# Patient Record
Sex: Male | Born: 1960 | Race: Black or African American | Hispanic: No | Smoking: Former smoker
Health system: Southern US, Community
[De-identification: ages and names within clinical notes are randomized; demographics above are authoritative.]

## PROBLEM LIST (undated history)

## (undated) ENCOUNTER — Emergency Department (HOSPITAL_COMMUNITY): Admission: EM | Payer: Self-pay

## (undated) DIAGNOSIS — J449 Chronic obstructive pulmonary disease, unspecified: Secondary | ICD-10-CM

## (undated) DIAGNOSIS — F99 Mental disorder, not otherwise specified: Secondary | ICD-10-CM

## (undated) DIAGNOSIS — I499 Cardiac arrhythmia, unspecified: Secondary | ICD-10-CM

## (undated) DIAGNOSIS — I509 Heart failure, unspecified: Secondary | ICD-10-CM

## (undated) DIAGNOSIS — K219 Gastro-esophageal reflux disease without esophagitis: Secondary | ICD-10-CM

## (undated) DIAGNOSIS — I10 Essential (primary) hypertension: Secondary | ICD-10-CM

## (undated) DIAGNOSIS — E119 Type 2 diabetes mellitus without complications: Secondary | ICD-10-CM

## (undated) DIAGNOSIS — I2510 Atherosclerotic heart disease of native coronary artery without angina pectoris: Secondary | ICD-10-CM

## (undated) DIAGNOSIS — M109 Gout, unspecified: Secondary | ICD-10-CM

## (undated) DIAGNOSIS — N189 Chronic kidney disease, unspecified: Secondary | ICD-10-CM

## (undated) DIAGNOSIS — F329 Major depressive disorder, single episode, unspecified: Secondary | ICD-10-CM

## (undated) DIAGNOSIS — J101 Influenza due to other identified influenza virus with other respiratory manifestations: Secondary | ICD-10-CM

## (undated) DIAGNOSIS — F32A Depression, unspecified: Secondary | ICD-10-CM

## (undated) HISTORY — PX: CARDIAC CATHETERIZATION: SHX172

## (undated) HISTORY — PX: HERNIA REPAIR: SHX51

## (undated) HISTORY — DX: Chronic kidney disease, unspecified: N18.9

## (undated) HISTORY — PX: SHOULDER SURGERY: SHX246

## (undated) HISTORY — DX: Chronic obstructive pulmonary disease, unspecified: J44.9

## (undated) HISTORY — DX: Heart failure, unspecified: I50.9

## (undated) HISTORY — PX: ANKLE SURGERY: SHX546

## (undated) HISTORY — DX: Cardiac arrhythmia, unspecified: I49.9

---

## 2003-01-27 ENCOUNTER — Emergency Department (HOSPITAL_COMMUNITY): Admission: EM | Admit: 2003-01-27 | Discharge: 2003-01-27 | Payer: Self-pay | Admitting: Emergency Medicine

## 2003-01-29 ENCOUNTER — Emergency Department (HOSPITAL_COMMUNITY): Admission: EM | Admit: 2003-01-29 | Discharge: 2003-01-30 | Payer: Self-pay | Admitting: Emergency Medicine

## 2003-01-29 ENCOUNTER — Encounter: Payer: Self-pay | Admitting: Emergency Medicine

## 2013-02-18 ENCOUNTER — Encounter (HOSPITAL_COMMUNITY): Payer: Self-pay | Admitting: Emergency Medicine

## 2013-02-18 ENCOUNTER — Emergency Department (HOSPITAL_COMMUNITY)
Admission: EM | Admit: 2013-02-18 | Discharge: 2013-02-18 | Disposition: A | Discharge status: Other Acute Inpatient Hospital | Payer: Self-pay | Attending: Emergency Medicine | Admitting: Emergency Medicine

## 2013-02-18 ENCOUNTER — Inpatient Hospital Stay (HOSPITAL_COMMUNITY)
Admission: AD | Admit: 2013-02-18 | Discharge: 2013-02-21 | DRG: 885 | Disposition: A | Discharge status: Home / Self Care | Payer: Self-pay | Source: Intra-hospital | Attending: Psychiatry | Admitting: Psychiatry

## 2013-02-18 ENCOUNTER — Encounter (HOSPITAL_COMMUNITY): Payer: Self-pay | Admitting: *Deleted

## 2013-02-18 DIAGNOSIS — Z91199 Patient's noncompliance with other medical treatment and regimen due to unspecified reason: Secondary | ICD-10-CM

## 2013-02-18 DIAGNOSIS — K0889 Other specified disorders of teeth and supporting structures: Secondary | ICD-10-CM

## 2013-02-18 DIAGNOSIS — G8929 Other chronic pain: Secondary | ICD-10-CM

## 2013-02-18 DIAGNOSIS — F1910 Other psychoactive substance abuse, uncomplicated: Secondary | ICD-10-CM

## 2013-02-18 DIAGNOSIS — M25579 Pain in unspecified ankle and joints of unspecified foot: Secondary | ICD-10-CM | POA: Insufficient documentation

## 2013-02-18 DIAGNOSIS — I10 Essential (primary) hypertension: Secondary | ICD-10-CM | POA: Diagnosis present

## 2013-02-18 DIAGNOSIS — F17200 Nicotine dependence, unspecified, uncomplicated: Secondary | ICD-10-CM | POA: Diagnosis present

## 2013-02-18 DIAGNOSIS — Z79899 Other long term (current) drug therapy: Secondary | ICD-10-CM | POA: Insufficient documentation

## 2013-02-18 DIAGNOSIS — M545 Low back pain, unspecified: Secondary | ICD-10-CM | POA: Diagnosis present

## 2013-02-18 DIAGNOSIS — F329 Major depressive disorder, single episode, unspecified: Secondary | ICD-10-CM

## 2013-02-18 DIAGNOSIS — K120 Recurrent oral aphthae: Secondary | ICD-10-CM | POA: Diagnosis present

## 2013-02-18 DIAGNOSIS — F339 Major depressive disorder, recurrent, unspecified: Principal | ICD-10-CM

## 2013-02-18 DIAGNOSIS — Z862 Personal history of diseases of the blood and blood-forming organs and certain disorders involving the immune mechanism: Secondary | ICD-10-CM | POA: Insufficient documentation

## 2013-02-18 DIAGNOSIS — R45851 Suicidal ideations: Secondary | ICD-10-CM | POA: Insufficient documentation

## 2013-02-18 DIAGNOSIS — K029 Dental caries, unspecified: Secondary | ICD-10-CM | POA: Diagnosis present

## 2013-02-18 DIAGNOSIS — F1410 Cocaine abuse, uncomplicated: Secondary | ICD-10-CM

## 2013-02-18 DIAGNOSIS — Z9119 Patient's noncompliance with other medical treatment and regimen: Secondary | ICD-10-CM

## 2013-02-18 DIAGNOSIS — F322 Major depressive disorder, single episode, severe without psychotic features: Secondary | ICD-10-CM

## 2013-02-18 DIAGNOSIS — M109 Gout, unspecified: Secondary | ICD-10-CM | POA: Diagnosis present

## 2013-02-18 DIAGNOSIS — Z8639 Personal history of other endocrine, nutritional and metabolic disease: Secondary | ICD-10-CM | POA: Insufficient documentation

## 2013-02-18 DIAGNOSIS — F1210 Cannabis abuse, uncomplicated: Secondary | ICD-10-CM

## 2013-02-18 HISTORY — DX: Mental disorder, not otherwise specified: F99

## 2013-02-18 HISTORY — DX: Depression, unspecified: F32.A

## 2013-02-18 HISTORY — DX: Gout, unspecified: M10.9

## 2013-02-18 HISTORY — DX: Essential (primary) hypertension: I10

## 2013-02-18 HISTORY — DX: Major depressive disorder, single episode, unspecified: F32.9

## 2013-02-18 LAB — COMPREHENSIVE METABOLIC PANEL
ALT: 13 U/L (ref 0–53)
AST: 16 U/L (ref 0–37)
Albumin: 3.5 g/dL (ref 3.5–5.2)
CO2: 25 mEq/L (ref 19–32)
Chloride: 106 mEq/L (ref 96–112)
GFR calc non Af Amer: 59 mL/min — ABNORMAL LOW (ref >90)
Sodium: 140 mEq/L (ref 135–145)
Total Bilirubin: 0.3 mg/dL (ref 0.3–1.2)

## 2013-02-18 LAB — CBC
Platelets: 265 10*3/uL (ref 150–400)
RBC: 4.48 MIL/uL (ref 4.22–5.81)
RDW: 14.4 % (ref 11.5–15.5)
WBC: 11.1 10*3/uL — ABNORMAL HIGH (ref 4.0–10.5)

## 2013-02-18 LAB — ACETAMINOPHEN LEVEL: Acetaminophen (Tylenol), Serum: <15 ug/mL (ref 10–30)

## 2013-02-18 LAB — RAPID URINE DRUG SCREEN, HOSP PERFORMED
Amphetamines: NOT DETECTED
Benzodiazepines: NOT DETECTED
Opiates: NOT DETECTED
Tetrahydrocannabinol: POSITIVE — AB

## 2013-02-18 MED ORDER — COLCHICINE 0.6 MG PO TABS
0.6000 mg | ORAL_TABLET | Freq: Two times a day (BID) | ORAL | Status: DC
  Administered 2013-02-18 – 2013-02-21 (×6): 0.6 mg via ORAL
  Filled 2013-02-18 (×7): qty 1
  Filled 2013-02-18 (×2): qty 14
  Filled 2013-02-18 (×5): qty 1

## 2013-02-18 MED ORDER — CLONIDINE HCL 0.2 MG PO TABS
0.2000 mg | ORAL_TABLET | Freq: Two times a day (BID) | ORAL | Status: DC
  Administered 2013-02-18 – 2013-02-19 (×2): 0.2 mg via ORAL
  Filled 2013-02-18 (×5): qty 1
  Filled 2013-02-18: qty 2
  Filled 2013-02-18: qty 1

## 2013-02-18 MED ORDER — OXYCODONE-ACETAMINOPHEN 5-325 MG PO TABS
2.0000 | ORAL_TABLET | Freq: Once | ORAL | Status: AC
  Administered 2013-02-18: 2 via ORAL
  Filled 2013-02-18: qty 2

## 2013-02-18 MED ORDER — LISINOPRIL 10 MG PO TABS
10.0000 mg | ORAL_TABLET | Freq: Every morning | ORAL | Status: DC
  Administered 2013-02-18: 10 mg via ORAL
  Filled 2013-02-18 (×2): qty 1

## 2013-02-18 MED ORDER — ACETAMINOPHEN 325 MG PO TABS: 650.0000 mg | ORAL_TABLET | ORAL | Status: DC | PRN

## 2013-02-18 MED ORDER — ALUM & MAG HYDROXIDE-SIMETH 200-200-20 MG/5ML PO SUSP: 30.0000 mL | ORAL | Status: DC | PRN

## 2013-02-18 MED ORDER — LISINOPRIL 10 MG PO TABS
10.0000 mg | ORAL_TABLET | Freq: Every morning | ORAL | Status: DC
  Administered 2013-02-19: 10 mg via ORAL
  Filled 2013-02-18 (×3): qty 1

## 2013-02-18 MED ORDER — COLCHICINE 0.6 MG PO TABS
0.6000 mg | ORAL_TABLET | Freq: Two times a day (BID) | ORAL | Status: DC
  Administered 2013-02-18: 0.6 mg via ORAL
  Filled 2013-02-18 (×2): qty 1

## 2013-02-18 MED ORDER — MAGNESIUM HYDROXIDE 400 MG/5ML PO SUSP: 30.0000 mL | Freq: Every day | ORAL | Status: DC | PRN

## 2013-02-18 MED ORDER — CLONIDINE HCL 0.1 MG PO TABS
0.2000 mg | ORAL_TABLET | Freq: Two times a day (BID) | ORAL | Status: DC
  Administered 2013-02-18 (×2): 0.2 mg via ORAL
  Filled 2013-02-18 (×2): qty 2

## 2013-02-18 MED ORDER — TRAZODONE HCL 50 MG PO TABS
50.0000 mg | ORAL_TABLET | Freq: Every evening | ORAL | Status: DC | PRN
  Administered 2013-02-18 – 2013-02-19 (×2): 50 mg via ORAL
  Filled 2013-02-18 (×3): qty 1

## 2013-02-18 NOTE — ED Notes (Signed)
Report called to [facility] at [facility]. Accepted to room 508-1 Dr.Jonnalagadda. Will be transported by hospital security and mental health tech. Belongings bag x1 given to security.
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
## 2013-02-18 NOTE — Progress Notes (Signed)
D) Pt on the unit and tearful. States, "I just give up..I really just give up". Said "Everyone is down on me and doesn't give me a chance. I can't get ahead. No place to live and alone". Crying when sharing his feelings. A) Pt given encouragement and praise for coming to the hospital. Encouraged to speak with his social worker tomorrow and share his concerns. Verbal contract for Pt's safety obtained. R) Pt is sad and depressed. Orienting to the unit without difficulty.

## 2013-02-18 NOTE — ED Notes (Signed)
Pt unable to urinate at this time.  

## 2013-02-18 NOTE — Tx Team (Signed)
Initial Interdisciplinary Treatment Plan  PATIENT STRENGTHS: (choose at least two) Ability for insight Average or above average intelligence Capable of independent living General fund of knowledge  PATIENT STRESSORS: Financial difficulties   PROBLEM LIST: Problem List/Patient Goals Date to be addressed Date deferred Reason deferred Estimated date of resolution  Depression 02/18/13     Suicidal Ideation 02/18/13                                                DISCHARGE CRITERIA:  Ability to meet basic life and health needs Improved stabilization in mood, thinking, and/or behavior Verbal commitment to aftercare and medication compliance  PRELIMINARY DISCHARGE PLAN: Attend aftercare/continuing care group Placement in alternative living arrangements  PATIENT/FAMIILY INVOLVEMENT: This treatment plan has been presented to and reviewed with the patient, Chris [name], and/or family member, .  The patient and family have been given the opportunity to ask questions and make suggestions.  [name], [place] 02/18/2013, 6:31 PM

## 2013-02-18 NOTE — ED Notes (Signed)
Pt states he was released from prison 4 years ago, tonight he called [employer] and states he was going to kill himself by jumping in front of a car. Pt taken to [facility], pt has not taken BP meds x 2 days. Sent here for BP control and med clearance.
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
## 2013-02-18 NOTE — ED Provider Notes (Signed)
CSN: [id number]     Arrival date & time 02/18/13  0320 History   First MD Initiated Contact with Patient 02/18/13 [phone]     Chief Complaint  Patient presents with  . Medical Clearance   (Consider location/radiation/quality/duration/timing/severity/associated sxs/prior Treatment) Patient is a 52 y.o. male presenting with mental health disorder. The history is provided by the patient.  Mental Health Problem Presenting symptoms: suicidal thoughts   Patient accompanied by:  Law enforcement Degree of incapacity (severity):  Moderate Onset quality:  Sudden Duration: today. Timing:  Constant Progression:  Unchanged Chronicity:  New Context: not alcohol use and not drug abuse   Context comment:  Life stressors Treatment compliance:  Untreated Relieved by:  Nothing Worsened by:  Nothing tried Ineffective treatments:  None tried Associated symptoms: no abdominal pain, no chest pain and no headaches     Past Medical History  Diagnosis Date  . Hypertension   . Gout    Past Surgical History  Procedure Laterality Date  . Shoulder surgery    . Ankle surgery     No family history on file. History  Substance Use Topics  . Smoking status: Current Every Day Smoker  . Smokeless tobacco: Not on file  . Alcohol Use: No    Review of Systems  Constitutional: Negative for fever.  HENT: Negative for rhinorrhea, drooling and neck pain.   Eyes: Negative for pain.  Respiratory: Negative for cough and shortness of breath.   Cardiovascular: Negative for chest pain and leg swelling.  Gastrointestinal: Negative for nausea, vomiting, abdominal pain and diarrhea.  Genitourinary: Negative for dysuria and hematuria.  Musculoskeletal: Negative for gait problem.       Ankle pain  Skin: Negative for color change.  Neurological: Negative for numbness and headaches.  Hematological: Negative for adenopathy.  Psychiatric/Behavioral: Positive for suicidal ideas. Negative for behavioral problems.  All  other systems reviewed and are negative.    Allergies  Review of patient's allergies indicates no known allergies.  Home Medications   Current Outpatient Rx  Name  Route  Sig  Dispense  Refill  . cloNIDine (CATAPRES) 0.2 MG tablet   Oral   Take 0.2 mg by mouth 2 (two) times daily.         [name] lisinopril (PRINIVIL,ZESTRIL) 10 MG tablet   Oral   Take 10 mg by mouth every morning.          BP 184/107  Pulse 66  Temp(Src) 98.7 F (37.1 C) (Oral)  Resp 20  Wt 230 lb (104.327 kg)  SpO2 98% Physical Exam  Nursing note and vitals reviewed. Constitutional: He is oriented to person, place, and time. He appears well-developed and well-nourished.  HENT:  Head: Normocephalic and atraumatic.  Right Ear: External ear normal.  Left Ear: External ear normal.  Nose: Nose normal.  Mouth/Throat: Oropharynx is clear and moist. No oropharyngeal exudate.  Eyes: Conjunctivae and EOM are normal. Pupils are equal, round, and reactive to light.  Neck: Normal range of motion. Neck supple.  Cardiovascular: Normal rate, regular rhythm, normal heart sounds and intact distal pulses.  Exam reveals no gallop and no friction rub.   No murmur heard. Pulmonary/Chest: Effort normal and breath sounds normal. No respiratory distress. He has no wheezes.  Abdominal: Soft. Bowel sounds are normal. He exhibits no distension. There is no tenderness. There is no rebound and no guarding.  Musculoskeletal: Normal range of motion. He exhibits no edema and no tenderness.  Mild ttp of left medial ankle. Mild  warmth noted. 2+ distal pulses.   Neurological: He is alert and oriented to person, place, and time.  Skin: Skin is warm and dry.  Psychiatric: He has a normal mood and affect. His behavior is normal.    ED Course  Procedures (including critical care time) Labs Review Labs Reviewed  CBC - Abnormal; Notable for the following:    WBC 11.1 (*)    All other components within normal limits  COMPREHENSIVE  METABOLIC PANEL - Abnormal; Notable for the following:    Potassium 3.4 (*)    Creatinine, Ser 1.37 (*)    GFR calc non Af Amer 59 (*)    GFR calc Af Amer 68 (*)    All other components within normal limits  SALICYLATE LEVEL - Abnormal; Notable for the following:    Salicylate Lvl <2.0 (*)    All other components within normal limits  ACETAMINOPHEN LEVEL  ETHANOL  URINE RAPID DRUG SCREEN (HOSP PERFORMED)   Imaging Review No results found.  MDM   1. Major depression, recurrent    4:33 AM 52 y.o. male who presents with suicidal ideations. The patient states that he is under stress due to his job and kids. He states that he bought heroin today and wanted to overdose with it. He denies taking any medicine or trying to hurt himself. He denies any drug or alcohol abuse. The patient is afebrile and vital signs are unremarkable here. He is mildly hypertensive but has not taken his blood pressure medicines in 2 days. He also complains of gout in his left ankle. Will give pain medicine now for his gout pain and restart his home blood pressure medicines. Will consult psych for evaluation.     Junius Argyle, MD 02/18/13 2000

## 2013-02-18 NOTE — ED Notes (Signed)
Currently is having a gout flair up and is pain. Refuses tylenol. Order for Colchicine started and did receive a dose this morning. Encouraged to drink a lot of water.

## 2013-02-18 NOTE — Progress Notes (Signed)
Writer spoke with patient at medication window and he was sad and tearful discussing his situation that has caused him to feel suicidal. Patient reports that he feels hopeless concerning finding a job d/t his prior history. Writer offered patient encouragement concerning his situation. Writer informed patient of medication available if needed to aid with sleep. Patient c/o having a tooth absess that he feels has burst and inquiring about an antibiotic being started. Writer informed patient that this would be noted and to make sure he speaks to his doctor on tomorrow concerning this matter. Patient denies hi/a/v hallucinations and has passive si which he verbally contracts for safety. Will continue to monitor with 15 min checks.

## 2013-02-18 NOTE — ED Notes (Signed)
Pt tearful, stating that he can't seem to catch a break and is unable to find a job due to his hx of serving time in prison. Pt states he can't go on like this.

## 2013-02-18 NOTE — Progress Notes (Signed)
52 year old male pt admitted on voluntary basis. Pt reports, on admission, that he has been feeling depressed and suicidal. Pt spoke about how he had bought some heroin and had someone fix the needle for him with the thought of overdosing on the heroin. Pt said he couldn't do it and threw the needle away and then had thoughts of walking into traffic. Pt said he could not do that instead decided to call the police who picked him up and brought him to the ED. Pt reports that he has been out of jail for 4 years and is trying to do right and get a job but can not find one. Pt also unsure of living situation when he leaves from here but says he does not want to go back to where he was. Pt states that he does not have any support systems. Pt is able to contract for safety on the unit.

## 2013-02-19 DIAGNOSIS — F1210 Cannabis abuse, uncomplicated: Secondary | ICD-10-CM

## 2013-02-19 DIAGNOSIS — F1410 Cocaine abuse, uncomplicated: Secondary | ICD-10-CM

## 2013-02-19 DIAGNOSIS — F339 Major depressive disorder, recurrent, unspecified: Principal | ICD-10-CM

## 2013-02-19 MED ORDER — FLUOXETINE HCL 10 MG PO CAPS
10.0000 mg | ORAL_CAPSULE | Freq: Every day | ORAL | Status: DC
  Administered 2013-02-19 – 2013-02-21 (×3): 10 mg via ORAL
  Filled 2013-02-19 (×6): qty 1

## 2013-02-19 MED ORDER — CLONIDINE HCL 0.1 MG PO TABS
0.3000 mg | ORAL_TABLET | Freq: Two times a day (BID) | ORAL | Status: DC
  Administered 2013-02-19 – 2013-02-21 (×4): 0.3 mg via ORAL
  Filled 2013-02-19: qty 60
  Filled 2013-02-19 (×2): qty 1
  Filled 2013-02-19: qty 60
  Filled 2013-02-19: qty 1
  Filled 2013-02-19: qty 3
  Filled 2013-02-19 (×5): qty 1

## 2013-02-19 MED ORDER — LISINOPRIL 20 MG PO TABS
20.0000 mg | ORAL_TABLET | Freq: Every morning | ORAL | Status: DC
  Administered 2013-02-20 – 2013-02-21 (×2): 20 mg via ORAL
  Filled 2013-02-19 (×4): qty 1

## 2013-02-19 NOTE — [facility] Group Notes (Signed)
[facility] LCSW Group Therapy                 Overcoming Obstacles  1:15 -2:30        02/19/2013   3:49 PM   Type of Therapy:  Group Therapy  Participation Level: Did not attend goup.   Wynn Banker 02/19/2013   3:49 PM

## 2013-02-19 NOTE — Progress Notes (Signed)
D: Patient in the hallway on approach.  Patient states he had a bad day because he was not able to get percocet for a tooth abscess.  Writer offered patient tylenol but patient refused and stated he was not having pain.  Patient can be flirtatious but he is easily redirected.  Patient states he has passive SI but verbally contracts for safety.     A: Staff to monitor Q 15 mins for safety.  Encouragement and support offered.  No scheduled medications administered per orders.  Trazodone administered prn for sleep. R: Patient remains safe on the unit.  Patient did not attend group tonight.  Patient visible on the unit and interacting with peers.  Patient taking administered medications.

## 2013-02-19 NOTE — [facility] Group Notes (Signed)
[facility] LCSW Aftercare Discharge Planning Group Note   02/19/2013 10:16 AM  Participation Quality:  Did not attend group.  Jordanne Elsbury, [name]

## 2013-02-19 NOTE — Progress Notes (Signed)
The focus of this group is to help patients review their daily goal of treatment and discuss progress on daily workbooks. Pt attended the evening group ten minutes late and left the group twice before returning to speak with the Writer. Pt initially interrupted group to ask if the Writer could heat up his coffee and was told this could happen following group but not during. Pt responded minimally to the Writer's prompts and seemed disinterested in the group.

## 2013-02-19 NOTE — Progress Notes (Signed)
Adult Psychoeducational Group Note  Date:  02/18/13 Time: 8:00 pm  Group Topic/Focus:  Wrap-Up Group:   The focus of this group is to help patients review their daily goal of treatment and discuss progress on daily workbooks.  Participation Level:  Active  Participation Quality:  Appropriate  Affect:  Appropriate  Cognitive:  Appropriate  Insight: Good  Engagement in Group:  Engaged  Modes of Intervention:  Discussion, Education, Socialization and Support  Additional Comments:  Pt stated that he is in the hospital for depression. Pt stated that he tried to walk out in front of a truck. Pt has been to prison and stated that he has struggled to find work due to his criminal record. Pt stated that he is frustrated and wants a second chance in life. 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   [place], Chris [name] 02/19/2013, 1:25 AM
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
## 2013-02-19 NOTE — Progress Notes (Signed)
Adult Psychoeducational Group Note  Date:  02/19/2013 Time: 11:00am Group Topic/Focus:  Wellness Toolbox:   The focus of this group is to discuss various aspects of wellness, balancing those aspects and exploring ways to increase the ability to experience wellness.  Patients will create a wellness toolbox for use upon discharge.  Participation Level:  Active  Participation Quality:  Appropriate and Attentive  Affect:  Appropriate  Cognitive:  Alert and Appropriate  Insight: Appropriate and Good  Engagement in Group:  Engaged  Modes of Intervention:  Discussion and Education  Additional Comments:  Pt attended and participated in group. When asked what was his biggest struggle he stated anger. He said he would rater hurt himself than someone else when he gets angry.  Shelly Bombard D 02/19/2013, 2:13 PM

## 2013-02-19 NOTE — Tx Team (Addendum)
Interdisciplinary Treatment Plan Update   Date Reviewed:  02/19/2013  Time Reviewed:  10:13 AM  Progress in Treatment:   Attending groups: Yes Participating in groups: Yes Taking medication as prescribed: Yes  Tolerating medication: Yes Family/Significant other contact made: No, but consent for collateral contact with sister. Patient understands diagnosis: Yes  Discussing patient identified problems/goals with staff: Yes Medical problems stabilized or resolved: Yes Denies suicidal/homicidal ideation: Yes Patient has not harmed self or others: Yes  For review of initial/current patient goals, please see plan of care.  Estimated Length of Stay:   Reasons for Continued Hospitalization:  Anxiety Depression Medication stabilization  New Problems/Goals identified:    Discharge Plan or Barriers:   Home with outpatient follow up with Family Services  Additional Comments:  N/A   Attendees:  Patient:  02/19/2013 10:13 AM   Signature: Mervyn Gay, MD 02/19/2013 10:13 AM  Signature:  Verne Spurr, PA 02/19/2013 10:13 AM  Signature:  02/19/2013 10:13 AM  Signature:Beverly Terrilee Croak, RN 02/19/2013 10:13 AM  Signature:  Neill Loft RN 02/19/2013 10:13 AM  Signature:  Juline Patch, LCSW 02/19/2013 10:13 AM  Signature:  Reyes Ivan, LCSW 02/19/2013 10:13 AM  Signature:  Maseta Dorley,Care Coordinator 02/19/2013 10:13 AM  Signature:   02/19/2013 10:13 AM  Signature: Leighton Parody, RN 02/19/2013  10:13 AM   Signature:    Signature:      Scribe for Treatment Team:   Juline Patch,  02/19/2013 10:13 AM

## 2013-02-19 NOTE — Progress Notes (Addendum)
D:  Patient's self inventory sheet, patient has poor sleep, good appetite, normal energy level, good attention span.  Rated depression and hopelessness #6, anxiety #8.  Denied withdrawals.  Has experienced headaches in past 24 hours.  Contracts for safety.  Denied plan.   Worst pain #7, feels better.  No questions for staff.  Has lower back pain #10, lower right tooth pain "abcess busted #45."  Would like percocet for left ankle swollen.  Was given percocet in ER yesterday which did not touch pain.  Patient refused tylenol for pain.  No discharge plans.  No problems taking meds after discharge. A:  Medications administered per MD orders.  Emotional support and encouragement given patient. R:  Denied HI.  Denied A/V hallucinations.  SI , contracts for safety.  Will continue to monitor for sfaety with 15 minute checks.  Safety maintained.  1400  Patient has declined tylenol several times during the morning, stated he has tooth abcess pain.  Patient discussed with MD.  Patient came out of MD office, stating he desires strong pain medication.  Charge nurse and [facility] informed.
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
## 2013-02-19 NOTE — Progress Notes (Signed)
Recreation Therapy Notes  Date: 09.08.2014 Time: 3:00pm Location: [address] Day[address]  Group Topic: Wellness  Goal Area(s) Addresses:  Patient will define components of whole wellness. Patient will verbalize benefit of whole wellness.  Behavioral Response: Did not attend.   Marykay Lex Sybrina Laning, LRT/CTRS  Jearl Klinefelter 02/19/2013 10:43 PM

## 2013-02-20 DIAGNOSIS — F1210 Cannabis abuse, uncomplicated: Secondary | ICD-10-CM | POA: Diagnosis present

## 2013-02-20 DIAGNOSIS — F1410 Cocaine abuse, uncomplicated: Secondary | ICD-10-CM | POA: Diagnosis present

## 2013-02-20 DIAGNOSIS — F339 Major depressive disorder, recurrent, unspecified: Secondary | ICD-10-CM | POA: Diagnosis present

## 2013-02-20 DIAGNOSIS — G8929 Other chronic pain: Secondary | ICD-10-CM | POA: Diagnosis present

## 2013-02-20 MED ORDER — MELOXICAM 15 MG PO TABS
15.0000 mg | ORAL_TABLET | Freq: Every day | ORAL | Status: DC
  Administered 2013-02-20 – 2013-02-21 (×2): 15 mg via ORAL
  Filled 2013-02-20 (×3): qty 1
  Filled 2013-02-20 (×2): qty 2
  Filled 2013-02-20: qty 1
  Filled 2013-02-20: qty 28

## 2013-02-20 MED ORDER — BENZOCAINE 10 % MT GEL
Freq: Four times a day (QID) | OROMUCOSAL | Status: DC | PRN
  Administered 2013-02-20: 09:00:00 via OROMUCOSAL

## 2013-02-20 MED ORDER — LIDOCAINE 5 % EX PTCH
1.0000 | MEDICATED_PATCH | CUTANEOUS | Status: DC
  Administered 2013-02-20: 1 via TRANSDERMAL
  Filled 2013-02-20 (×2): qty 1
  Filled 2013-02-20: qty 7
  Filled 2013-02-20 (×2): qty 1

## 2013-02-20 NOTE — Progress Notes (Addendum)
Recreation Therapy Notes  Date: 09.09.2014 Time: 2:45pm Location: [address] Day[address]   Group Topic: Animal Assisted Activities  Behavioral Response: Did not attend  [employer], LRT/CTRS  Naarah Borgerding L 02/20/2013 5:07 PM
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
## 2013-02-20 NOTE — Progress Notes (Signed)
[facility] MD Progress Note  02/20/2013 1:36 PM Chris [name]  MRN:  [id number]  Subjective: Chris [name] states that he is angry because he didn't get anything for his dental pain. He also goes on to complain of pain in his lower back and the lump over his left iliac crest. He states his depression is better today and notes that it is a 4/10 and that his anxiety is 1/10. Chris [name] states that his sister came by and she has a friend who will give him a job. It seems that they have opened a door in their relationship and are able to communicate better. He is asking when he will be able to leave.  He reports no side effects from the prozac.   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  Hypertension    .  Gout    .  Mental disorder    .  Depression    Dental Carries  Aphthous Ulcer  Medical Non-compliance  AXIS IV: occupational problems, problems related to legal system/crime, problems related to social environment and problems with access to health care services  AXIS V: 51-60 moderate symptoms  ADL's:  Intact  Sleep: Good  Appetite:  Fair  Suicidal Ideation:  Denies today Homicidal Ideation:  denies AEB (as evidenced by):  Psychiatric Specialty Exam: Review of Systems  Constitutional: Negative.  Negative for fever, chills, weight loss, malaise/fatigue and diaphoresis.  HENT: Negative for congestion and sore throat.        Patient continues to complain of dental pain and the feeling that something has burst in his cheek. States the Oragel helped some.  Eyes: Negative for blurred vision, double vision and photophobia.  Respiratory:  Negative for cough, shortness of breath and wheezing.   Cardiovascular: Negative for chest pain, palpitations and PND.  Gastrointestinal: Negative for heartburn, nausea, vomiting, abdominal pain, diarrhea and constipation.  Musculoskeletal: Positive for back pain (Patient reports lower lumbar pain, and also a lump over the left iliac crest.). Negative for myalgias, joint pain and falls.  Neurological: Negative for dizziness, tingling, tremors, sensory change, speech change, focal weakness, seizures, loss of consciousness, weakness and headaches.  Endo/Heme/Allergies: Negative for polydipsia. Does not bruise/bleed easily.  Psychiatric/Behavioral: Negative for depression, suicidal ideas, hallucinations, memory loss and substance abuse. The patient is not nervous/anxious and does not have insomnia.     Blood pressure 161/101, pulse 69, temperature 97.2 F (36.2 C), temperature source Oral, resp. rate 22, height 5\' 11"  (1.803 m), weight 101.152 kg (223 lb).Body mass index is 31.12 kg/(m^2).  General Appearance: Casual  Eye Contact::  Fair  Speech:  Clear and Coherent  Volume:  Normal  Mood:  Anxious, Dysphoric and Irritable  Affect:  Congruent  Thought Process:  Goal Directed  Orientation:  Full (Time, Place, and Person)  Thought Content:  WDL  Suicidal Thoughts:  No  Homicidal Thoughts:  No  Memory:  Immediate;   Fair  Judgement:  Impaired  Insight:  Lacking  Psychomotor Activity:  Normal  Concentration:  Fair  Recall:  Fair  Akathisia:  No  Handed:  Right  AIMS (if indicated):     Assets:  Communication Skills Desire  for Improvement Physical Health  Sleep:  Number of Hours: 6.75   Current Medications: Current Facility-Administered Medications  Medication Dose Route Frequency Provider Last Rate Last Dose  . acetaminophen (TYLENOL) tablet 650 mg  650 mg Oral Q4H PRN Nanine Means, NP      . alum & mag hydroxide-simeth (MAALOX/MYLANTA) 200-200-20 MG/5ML suspension 30 mL  30 mL Oral  Q4H PRN Nanine Means, NP      . benzocaine (ORAJEL) 10 % mucosal gel   Mouth/Throat QID PRN Verne Spurr, PA-C      . cloNIDine (CATAPRES) tablet 0.3 mg  0.3 mg Oral BID Verne Spurr, PA-C   0.3 mg at 02/20/13 0846  . colchicine tablet 0.6 mg  0.6 mg Oral BID Nanine Means, NP   0.6 mg at 02/20/13 0847  . FLUoxetine (PROZAC) capsule 10 mg  10 mg Oral Daily Verne Spurr, PA-C   10 mg at 02/20/13 0847  . lisinopril (PRINIVIL,ZESTRIL) tablet 20 mg  20 mg Oral q morning - 10a Verne Spurr, PA-C   20 mg at 02/20/13 1157  . magnesium hydroxide (MILK OF MAGNESIA) suspension 30 mL  30 mL Oral Daily PRN Nanine Means, NP      . traZODone (DESYREL) tablet 50 mg  50 mg Oral QHS PRN Court Joy, PA-C   50 mg at 02/19/13 2156    Lab Results: No results found for this or any previous visit (from the past 48 hour(s)).  Physical Findings: AIMS: Facial and Oral Movements Muscles of Facial Expression: None, normal Lips and Perioral Area: None, normal Jaw: None, normal Tongue: None, normal,Extremity Movements Upper (arms, wrists, hands, fingers): None, normal Lower (legs, knees, ankles, toes): None, normal, Trunk Movements Neck, shoulders, hips: None, normal, Overall Severity Severity of abnormal movements (highest score from questions above): None, normal Incapacitation due to abnormal movements: None, normal Patient's awareness of abnormal movements (rate only patient's report): No Awareness, Dental Status Current problems with teeth and/or dentures?: No Does patient usually wear dentures?: No  CIWA:  CIWA-Ar Total: 4 COWS:  COWS Total Score: 2  Treatment Plan Summary: Daily contact with patient to assess and evaluate symptoms and progress in treatment Medication management  Plan: 1. Continue crisis management and stabilization. 2. Medication management to reduce current symptoms to base line and improve patient's overall level of functioning 3. Treat health problems as indicated. 4.  Develop treatment plan to decrease risk of relapse upon discharge and the need for readmission. 5. Psycho-social education regarding relapse prevention and self care. 6. Health care follow up as needed for medical problems. 7. Continue home medications where appropriate. 8. Dental consult is made for his oral pain. 9. Will order Lidoderm patch for his low back pain and add NSAID as well. 10. Will continue current medication regimen as written with the above changes as noted. 11. ELOS: 2 Days.   Medical Decision Making Problem Points:  Established problem, stable/improving (1) and New problem, with no additional work-up planned (3) Data Points:  Discuss tests with performing physician (1) Review or order clinical lab tests (1) Review or order medicine tests (1)  I certify that inpatient services furnished can reasonably be expected to improve the patient's condition.  Rona Ravens. [place] RPAC 4:02 PM 02/20/2013  Reviewed the information documented and agree with the treatment plan.  Samira Acero,JANARDHAHA R. 02/20/2013 7:14 PM

## 2013-02-20 NOTE — BHH Group Notes (Signed)
B[facility] LCSW Group Therapy  Feelings Around Diagnosis  02/20/2013 2:49 PM  Type of Therapy:  Group Therapy  Participation Level:  Minimal  Participation Quality:  Drowsy  Affect:  Depressed and Irritable  Cognitive:  Appropriate  Insight:  Improving  Engagement in Therapy:  Improving  Modes of Intervention:  Discussion, Education, Exploration, Problem-Solving, Rapport Building, Support   Summary of Progress/Problems: Patient shared his diagnosis is depression but he goes between depression and anger.  He shared he is having difficulty keeping his anger under control but is working on it.  Wynn Banker 02/20/2013, 2:49 PM

## 2013-02-20 NOTE — Progress Notes (Signed)
Adult Psychoeducational Group Note  Date:  02/20/2013 Time:  11:00am Group Topic/Focus:  Recovery Goals:   The focus of this group is to identify appropriate goals for recovery and establish a plan to achieve them.  Participation Level:  Did Not Attend  Participation Quality:    Affect:   Cognitive:    Insight:   Engagement in Group:    Modes of Intervention:    Additional Comments:  Pt did not attend group.  Shelly Bombard D 02/20/2013, 1:49 PM

## 2013-02-20 NOTE — Progress Notes (Addendum)
D:  Patient refused to discuss patient self inventory form this morning.  Has been irritable because he wants pain medication stronger than tylenol.  Had stated that percocet did not touch his pain that he was given in hospital before coming to [facility].   However, patient was seen in hallway, talking to peers, staff.  Respirations even and unlabored.  No signs of pain/distress noted on patient's face or body movements. A:  Medications administered per MD orders.  Emotional support and encouragement given patient. R:  Will continue to monitor patient for safety with 15 minute checks.  Safety maintained.  Nurse requested patient to take lisinopril during the morning.  Patient refused and went to bed.  Later patient got up and took lisinopril.  Stated he wants to be left alone at times.  Do not bother him.  Refused orajel when offered by nurse the second time.   1600  Patient refuses tylenol and orajel for his oral pain/discomfort.  Nurse has offered these prn meds several times throughout the day which patient hs declined.

## 2013-02-20 NOTE — B[facility] Suicide Risk Assessment (Signed)
[facility] INPATIENT:  Family/Significant Other Suicide Prevention Education  Suicide Prevention Education:  Contact Attempts; Marisue Brooklyn, Sister, [phone] has been identified by the patient as the family member/significant other with whom the patient will be residing, and identified as the person(s) who will aid the patient in the event of a mental health crisis.  With written consent from the patient, two attempts were made to provide suicide prevention education, prior to and/or following the patient's discharge.  We were unsuccessful in providing suicide prevention education.  A suicide education pamphlet was given to the patient to share with family/significant other.  Date and time of first attempt:  Message left on voicemail Date and time of second attempt: Wynn Banker 02/20/2013, 8:43 AM

## 2013-02-20 NOTE — Progress Notes (Signed)
Patient did not attend 0900 nurse education orientation group this morning. 

## 2013-02-21 MED ORDER — TRAZODONE HCL 50 MG PO TABS
50.0000 mg | ORAL_TABLET | Freq: Every evening | ORAL | Status: DC | PRN
  Filled 2013-02-21: qty 1

## 2013-02-21 MED ORDER — CLONIDINE HCL 0.3 MG PO TABS: 0.3000 mg | ORAL_TABLET | Freq: Two times a day (BID) | ORAL | Status: DC

## 2013-02-21 MED ORDER — LISINOPRIL 30 MG PO TABS: 30.0000 mg | ORAL_TABLET | Freq: Every morning | ORAL | Status: DC

## 2013-02-21 MED ORDER — LIDOCAINE 5 % EX PTCH: 1.0000 | MEDICATED_PATCH | CUTANEOUS | Status: DC

## 2013-02-21 MED ORDER — FLUOXETINE HCL 20 MG PO CAPS
20.0000 mg | ORAL_CAPSULE | Freq: Every day | ORAL | Status: DC
  Filled 2013-02-21: qty 14

## 2013-02-21 MED ORDER — MELOXICAM 15 MG PO TABS: 15.0000 mg | ORAL_TABLET | Freq: Every day | ORAL | Status: DC

## 2013-02-21 MED ORDER — COLCHICINE 0.6 MG PO TABS: 0.6000 mg | ORAL_TABLET | Freq: Two times a day (BID) | ORAL | Status: DC

## 2013-02-21 MED ORDER — LISINOPRIL 10 MG PO TABS
30.0000 mg | ORAL_TABLET | Freq: Every day | ORAL | Status: DC
  Filled 2013-02-21: qty 30

## 2013-02-21 MED ORDER — FLUOXETINE HCL 20 MG PO CAPS: 20.0000 mg | ORAL_CAPSULE | Freq: Every day | ORAL | Status: DC

## 2013-02-21 MED ORDER — TRAZODONE HCL 50 MG PO TABS: ORAL_TABLET | ORAL | Status: DC

## 2013-02-21 NOTE — Progress Notes (Signed)
Adult Psychoeducational Group Note  Date:  02/21/2013 Time:  10:00am Group Topic/Focus:  Therapeutic Activity  Participation Level:  Did Not Attend  Participation Quality:    Affect:    Cognitive:    Insight:   Engagement in Group:    Modes of Intervention:    Additional Comments:  Pt did not attend group  Shelly Bombard D 02/21/2013, 1:56 PM

## 2013-02-21 NOTE — BHH Suicide Risk Assessment (Signed)
[facility] INPATIENT:  Family/Significant Other Suicide Prevention Education  Suicide Prevention Education:  Education Completed; [name], Sister, [phone]; has been identified by the patient as the family member/significant other with whom the patient will be residing, and identified as the person(s) who will aid the patient in the event of a mental health crisis (suicidal ideations/suicide attempt).  With written consent from the patient, the family member/significant other has been provided the following suicide prevention education, prior to the and/or following the discharge of the patient.  The suicide prevention education provided includes the following:  Suicide risk factors  Suicide prevention and interventions  National Suicide Hotline telephone number  [facility] assessment telephone number  [facility] Emergency Assistance 911  [facility] and/or Residential Mobile Crisis Unit telephone number  Request made of family/significant other to:  Remove weapons (e.g., guns, rifles, knives), all items previously/currently identified as safety concern. Sister stated she does not know whether or not patient has access to guns.   Remove drugs/medications (over-the-counter, prescriptions, illicit drugs), all items previously/currently identified as a safety concern.  The family member/significant other verbalizes understanding of the suicide prevention education information provided.  The family member/significant other agrees to remove the items of safety concern listed above.  Wynn Banker 02/21/2013, 12:38 PM

## 2013-02-21 NOTE — B[facility] Group Notes (Signed)
[facility] LCSW Aftercare Discharge Planning Group Note   02/21/2013 9:52 AM  Participation Quality:  Did not attend group.  Chris [name], [name]

## 2013-02-21 NOTE — Progress Notes (Signed)
[facility] Adult Case Management Discharge Plan :  Will you be returning to the same living situation after discharge:  No.  Patient is considering staying with family at discharge. At discharge, do you have transportation home?:No.  Patient will be assisted with bus pass. Do you have the ability to pay for your medications:No. Patient assisted with indigent medicaitons  Release of information consent forms completed and in the chart;  Patient's signature needed at discharge.  Patient to Follow up at: Follow-up Information   Follow up with Dr. Dartha Lodge - [facility] On 02/22/2013. (Thursday, February 22, 2013 at 11:30 AM)    Contact information:   [address] [place], [place]   [address]  [phone]      Follow up with Sharen Hint - Service On 02/22/2013. (You are scheduled with Sharen Hint, LCSW on Thursday, February 22, 2013 at 12:30)       Patient denies SI/HI:   Patient no longer endorsing SI/HI or other thoughts of self harm.   Safety Planning and Suicide Prevention discussed:  .Reviewed with all patients during discharge planning group  Jansen Goodpasture, [name] 02/21/2013, 12:49 PM

## 2013-02-21 NOTE — Progress Notes (Signed)
Pt woke up very irritable, pt stated "I want to move". Pt very labile and started to escalate verbally.
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
## 2013-02-21 NOTE — Progress Notes (Signed)
Discharge Note: Discharge instructions & prescriptions given to patient. Patient verbalized understanding of discharge instructions and prescriptions. Returned belongings to patient. Denies SI/HI/AVH. Patient d/c without incident. 

## 2013-02-21 NOTE — Progress Notes (Signed)
Adult Psychoeducational Group Note  Date:  02/20/13 Time:  8:00 pm  Group Topic/Focus:  Wrap-Up Group:   The focus of this group is to help patients review their daily goal of treatment and discuss progress on daily workbooks.  Participation Level:  Active  Participation Quality:  Appropriate and Sharing  Affect:  Appropriate  Cognitive:  Appropriate  Insight: Good  Engagement in Group:  Engaged  Modes of Intervention:  Discussion, Education, Socialization and Support  Additional Comments:  Pt stated that he is suffering from depression. Pt was asked by the [facility] what he is thankful for in his life and he stated that he was thankful for being at the [facility] and thankful for the friends that he has met while in the hospital.   Laural Benes, Detta Mellin 02/21/2013, 1:23 AM

## 2013-02-21 NOTE — [facility] Suicide Risk Assessment (Signed)
Suicide Risk Assessment  Discharge Assessment     Demographic Factors:  Male, Adolescent or young adult, Low socioeconomic status and Unemployed  Mental Status Per Nursing Assessment::   On Admission:  Self-harm thoughts  Current Mental Status by Physician: Mental Status Examination: Patient appeared as per his stated age, casually dressed, and fairly groomed, and maintaining good eye contact. Patient has good mood and his affect was constricted. He has normal rate, rhythm, and volume of speech. His thought process is linear and goal directed. Patient has denied suicidal, homicidal ideations, intentions or plans. Patient has no evidence of auditory or visual hallucinations, delusions, and paranoia. Patient has fair insight judgment and impulse control.  Loss Factors: Financial problems/change in socioeconomic status  Historical Factors: Prior suicide attempts and Impulsivity  Risk Reduction Factors:   Sense of responsibility to family, Religious beliefs about death, Positive therapeutic relationship and Positive coping skills or problem solving skills  Continued Clinical Symptoms:  Depression:   Aggression Impulsivity Recent sense of peace/wellbeing Alcohol/Substance Abuse/Dependencies Previous Psychiatric Diagnoses and Treatments Medical Diagnoses and Treatments/Surgeries  Cognitive Features That Contribute To Risk:  Polarized thinking    Suicide Risk:  Mild:  Suicidal ideation of limited frequency, intensity, duration, and specificity.  There are no identifiable plans, no associated intent, mild dysphoria and related symptoms, good self-control (both objective and subjective assessment), few other risk factors, and identifiable protective factors, including available and accessible social support.  Discharge Diagnoses:   AXIS I:  Major Depression, Recurrent severe, Substance Induced Mood Disorder and Cocaine abuse AXIS II:  Deferred AXIS III:   Past Medical History   Diagnosis Date  . Hypertension   . Gout   . Mental disorder   . Depression    AXIS IV:  economic problems, occupational problems, other psychosocial or environmental problems, problems related to social environment and problems with primary support group AXIS V:  51-60 moderate symptoms  Plan Of Care/Follow-up recommendations:  Activity:  As tolerated Diet:  Regular  Is patient on multiple antipsychotic therapies at discharge:  No   Has Patient had three or more failed trials of antipsychotic monotherapy by history:  No  Recommended Plan for Multiple Antipsychotic Therapies: NA  Wetona Viramontes,JANARDHAHA R. 02/21/2013, 1:08 PM

## 2013-02-21 NOTE — Progress Notes (Signed)
Pt stated he would like to switch rooms with Durelle Bynum in 508, because "I want to punch that Moth$% Fu%&*# in the mouth, I can't even go in the dayroom, if I see him It makes me mad".

## 2013-02-21 NOTE — Tx Team (Signed)
Interdisciplinary Treatment Plan Update   Date Reviewed:  02/21/2013  Time Reviewed:  11:10 AM  Progress in Treatment:   Attending groups: Yes Participating in groups: Yes Taking medication as prescribed: Yes  Tolerating medication: Yes Family/Significant other contact made: Yes, collateral contact made with sister. Patient understands diagnosis: Yes  Discussing patient identified problems/goals with staff: Yes Medical problems stabilized or resolved: Yes Denies suicidal/homicidal ideation: Yes Patient has not harmed self or others: Yes  For review of initial/current patient goals, please see plan of care.  Estimated Length of Stay: Discharge today  Reasons for Continued Hospitalization:   New Problems/Goals identified:    Discharge Plan or Barriers:   Home with outpatient follow up with [facility]  Additional Comments:  N/A   Attendees:  Patient: Chris [name] 02/21/2013 11:10 AM   Signature: Mervyn Gay, MD 02/21/2013 11:10 AM  Signature:  Verne Spurr, PA 02/21/2013 11:10 AM  Signature:  02/21/2013 11:10 AM  Signature: Harold Barban, RN 02/21/2013 11:10 AM  Signature:      02/21/2013 11:10 AM  Signature:  Juline Patch, LCSW 02/21/2013 11:10 AM  Signature:  Reyes Ivan, LCSW 02/21/2013 11:10 AM  Signature:  Maseta Dorley,Care Coordinator 02/21/2013 11:10 AM  Signature:   02/21/2013 11:10 AM  Signature: Leighton Parody, RN 02/21/2013  11:10 AM  Signature:    Signature:      Scribe for Treatment Team:   [employer],  02/21/2013 11:10 AM

## 2013-02-21 NOTE — Progress Notes (Signed)
Patient ID: Chris [name], male   DOB: 05-18-1961, 52 y.o.   MRN: [id number] Pt did not attend 11am Psycho educational group.

## 2013-02-21 NOTE — Discharge Summary (Signed)
Physician Discharge Summary Note  Patient:  Chris [name] is an 52 y.o., male MRN:  [id number] DOB:  1960/06/19 Patient phone:  [phone] (home)  Patient address:   [address] [place] [address],   Date of Admission:  02/18/2013 Date of Discharge: 02/21/2013  Reason for Admission:  Depression with suicidal ideation  Discharge Diagnoses: Principal Problem:   Major depression, recurrent Active Problems:   Cocaine abuse   Cannabis abuse   Pain, dental   Back pain, chronic  Review of Systems  Constitutional: Negative.  Negative for fever, chills, weight loss, malaise/fatigue and diaphoresis.  HENT: Negative for congestion and sore throat.   Eyes: Negative for blurred vision, double vision and photophobia.  Respiratory: Negative for cough, shortness of breath and wheezing.   Cardiovascular: Negative for chest pain, palpitations and PND.  Gastrointestinal: Negative for heartburn, nausea, vomiting, abdominal pain, diarrhea and constipation.  Musculoskeletal: Negative for myalgias, joint pain and falls.  Neurological: Negative for dizziness, tingling, tremors, sensory change, speech change, focal weakness, seizures, loss of consciousness, weakness and headaches.  Endo/Heme/Allergies: Negative for polydipsia. Does not bruise/bleed easily.  Psychiatric/Behavioral: Negative for depression, suicidal ideas, hallucinations, memory loss and substance abuse. The patient is not nervous/anxious and does not have insomnia.     DSM5: DSM5:  Schizophrenia Disorders:  Obsessive-Compulsive Disorders:  Trauma-Stressor Disorders:  Substance/Addictive Disorders: Cannabis Use Disorder - Mild (305.20) Cocaine abuse  Depressive Disorders: Major Depressive Disorder - Moderate (296.22)  AXIS I: MDD recurrent w/o psychotic features, Cocaine abuse, Cannabis abuse  AXIS II: Deferred  AXIS III:  Past Medical History   Diagnosis  Date   .  Hypertension    .  Gout    .  Mental disorder    .   Depression    Dental Carries  Aphthous Ulcer  Medical Non-compliance  AXIS IV: occupational problems, problems related to legal system/crime, problems related to social environment and problems with access to health care services  AXIS V: 51-60 moderate symptoms  Level of Care:  OP  Hospital Course: [name] is a 52 yr old AA male with a long criminal history who presented to the [facility] via police from [place]. He called police to tell them that he was going to kill himself. He was picked up and taken to [facility] for evaluation. His blood pressure was felt to be unstable and he was then transported to the [facility] where he was accepted for acute psychiatric hospitalization due to his suicidal ideation with plans to walk into traffic.      Upon arrival at the unit he was evaluated and his symptoms were identified. He was initially resistant to cooperation and focused on getting pain medication for his back, gout, and dental pain.  Medication management was initiated with prozac, and ibuprofen.      He was oriented to the unit and encouraged to participate in unit programming.[name] noted that he was depressed for years since he got out of prison and has not been able to get a job.      Each day he was assessed by a provider to establish his response to treatment. Each day he requested pain medication in narcotic form that far exceeded his observed level of discomfort. He noted that his sister visited and he asked for discharge shortly after that stating that she had found him a job and that he was ready to leave.      On the day of discharge he denied SI/HI and AVH. He  was alert and oriented and in full contact with reality. His blood pressure was stable. He was given samples and a prescription for 1 month supply of his psychiatric medications.  On discharge he was in improved condition than upon arrival and agreed to follow up as noted below.  Consults:  Dental but was declined for  treatment.  Significant Diagnostic Studies:  labs: CBC, UA, UDS, CMP  Discharge Vitals:   Blood pressure 188/119, pulse 56, temperature 97.2 F (36.2 C), temperature source Oral, resp. rate 18, height 5\' 11"  (1.803 m), weight 101.152 kg (223 lb). Body mass index is 31.12 kg/(m^2). Lab Results:   No results found for this or any previous visit (from the past 72 hour(s)).  Physical Findings: AIMS: Facial and Oral Movements Muscles of Facial Expression: None, normal Lips and Perioral Area: None, normal Jaw: None, normal Tongue: None, normal,Extremity Movements Upper (arms, wrists, hands, fingers): None, normal Lower (legs, knees, ankles, toes): None, normal, Trunk Movements Neck, shoulders, hips: None, normal, Overall Severity Severity of abnormal movements (highest score from questions above): None, normal Incapacitation due to abnormal movements: None, normal Patient's awareness of abnormal movements (rate only patient's report): No Awareness, Dental Status Current problems with teeth and/or dentures?: No Does patient usually wear dentures?: No  CIWA:  CIWA-Ar Total: 4 COWS:  COWS Total Score: 2  Psychiatric Specialty Exam: See Psychiatric Specialty Exam and Suicide Risk Assessment completed by Attending Physician prior to discharge.  Discharge destination:  Home  Is patient on multiple antipsychotic therapies at discharge:  No   Has Patient had three or more failed trials of antipsychotic monotherapy by history:  No  Recommended Plan for Multiple Antipsychotic Therapies: NA  Discharge Orders   Future Orders Complete By Expires   Diet - low sodium heart healthy  As directed    Discharge instructions  As directed    Comments:     Take all of your medications as directed. Be sure to keep all of your follow up appointments.  If you are unable to keep your follow up appointment, call your Doctor's office to let them know, and reschedule.  Make sure that you have enough  medication to last until your appointment. Be sure to get plenty of rest. Going to bed at the same time each night will help. Try to avoid sleeping during the day.  Increase your activity as tolerated. Regular exercise will help you to sleep better and improve your mental health. Eating a heart healthy diet is recommended. Try to avoid salty or fried foods. Be sure to avoid all alcohol and illegal drugs.   Increase activity slowly  As directed        Medication List       Indication   cloNIDine 0.3 MG tablet  Commonly known as:  CATAPRES  Take 1 tablet (0.3 mg total) by mouth 2 (two) times daily. For high blood pressure   Indication:  High Blood Pressure     colchicine 0.6 MG tablet  Take 1 tablet (0.6 mg total) by mouth 2 (two) times daily. For gout.   Indication:  Joint Inflammation due to Gout     FLUoxetine 20 MG capsule  Commonly known as:  PROZAC  Take 1 capsule (20 mg total) by mouth daily. For depression and anxiety.   Indication:  Depression     lidocaine 5 %  Commonly known as:  LIDODERM  Place 1 patch onto the skin daily. Remove & Discard patch within 12 hours or  as directed by MD for low back pain.   Indication:  low back pain.     lisinopril 30 MG tablet  Commonly known as:  PRINIVIL,ZESTRIL  Take 1 tablet (30 mg total) by mouth every morning. For hypertension.   Indication:  High Blood Pressure     meloxicam 15 MG tablet  Commonly known as:  MOBIC  Take 1 tablet (15 mg total) by mouth daily. For osteoarthritis and low back pain.   Indication:  Joint Damage causing Pain and Loss of Function     traZODone 50 MG tablet  Commonly known as:  DESYREL  Take one tablet at bedtime if needed for insomnia.   Indication:  Trouble Sleeping           Follow-up Information   Follow up with Dr. Dartha Lodge [facility] On 02/22/2013. (Thursday, February 22, 2013 at 11:30 AM)    Contact information:   [address] [place], [place]    [address]  [phone]      Follow-up recommendations:   Activities: Resume activity as tolerated. Diet: Heart healthy low sodium diet Tests: Follow up testing will be determined by your out patient provider. Comments:  Patient was provided a list of local pain clinics for him to call upon discharge. There is no indigent clinic in [place] for chronic pain management.  He is given the list to call regarding cash only visit.  Total Discharge Time:  Greater than 30 minutes.  Signed: MASHBURN,NEIL 02/21/2013, 11:57 AM  Patient is personally examined for suicidal risk assessment and case discussed with physician extender and discharge treatment plan developed. Reviewed the information documented and agree with the treatment plan.   Marquel Spoto,JANARDHAHA R. 02/23/2013 8:57 AM

## 2013-02-21 NOTE — Progress Notes (Signed)
D: Pt denies SI/HI/AVH. Pt is pleasant and cooperative. Pt had to be moved to quiet room due to excessive snoring of roommate. Pt seems to be a little intrusive, but is redirectable.   A: Pt was offered support and encouragement. Pt was given scheduled medications. Pt was encourage to attend groups. Q 15 minute checks were done for safety.   R:Pt attends groups and interacts well with peers and staff. Pt is taking medication.Pt receptive to treatment and safety maintained on unit.

## 2013-02-26 NOTE — Progress Notes (Signed)
Patient Discharge Instructions:  After Visit Summary (AVS):   Faxed to:  02/26/13 Discharge Summary Note:   Faxed to:  02/26/13 Psychiatric Admission Assessment Note:   Faxed to:  02/26/13 Suicide Risk Assessment - Discharge Assessment:   Faxed to:  02/26/13 Faxed/Sent to the Next Level Care provider:  02/26/13 Faxed to [facility] @ [phone]  Jerelene Redden, 02/26/2013, 3:36 PM

## 2013-04-15 ENCOUNTER — Emergency Department: Payer: Self-pay | Admitting: Emergency Medicine

## 2013-05-03 ENCOUNTER — Emergency Department: Payer: Self-pay | Admitting: Internal Medicine

## 2013-05-12 ENCOUNTER — Encounter (HOSPITAL_COMMUNITY): Payer: Self-pay | Admitting: Emergency Medicine

## 2013-05-12 ENCOUNTER — Emergency Department (HOSPITAL_COMMUNITY)
Admission: EM | Admit: 2013-05-12 | Discharge: 2013-05-12 | Disposition: A | Discharge status: Home / Self Care | Payer: Self-pay | Attending: Emergency Medicine | Admitting: Emergency Medicine

## 2013-05-12 ENCOUNTER — Emergency Department (HOSPITAL_COMMUNITY): Payer: Self-pay

## 2013-05-12 DIAGNOSIS — J159 Unspecified bacterial pneumonia: Secondary | ICD-10-CM | POA: Insufficient documentation

## 2013-05-12 DIAGNOSIS — J189 Pneumonia, unspecified organism: Secondary | ICD-10-CM

## 2013-05-12 DIAGNOSIS — I10 Essential (primary) hypertension: Secondary | ICD-10-CM | POA: Insufficient documentation

## 2013-05-12 DIAGNOSIS — R079 Chest pain, unspecified: Secondary | ICD-10-CM | POA: Insufficient documentation

## 2013-05-12 DIAGNOSIS — F17200 Nicotine dependence, unspecified, uncomplicated: Secondary | ICD-10-CM | POA: Insufficient documentation

## 2013-05-12 LAB — CBC WITH DIFFERENTIAL/PLATELET
Basophils Absolute: 0 10*3/uL (ref 0.0–0.1)
Basophils Relative: 0 % (ref 0–1)
Eosinophils Absolute: 0.1 10*3/uL (ref 0.0–0.7)
Eosinophils Relative: 0 % (ref 0–5)
HCT: 44.8 % (ref 39.0–52.0)
Hemoglobin: 15.4 g/dL (ref 13.0–17.0)
Lymphocytes Relative: 11 % — ABNORMAL LOW (ref 12–46)
Lymphs Abs: 2 10*3/uL (ref 0.7–4.0)
MCH: 30.9 pg (ref 26.0–34.0)
MCHC: 34.4 g/dL (ref 30.0–36.0)
MCV: 90 fL (ref 78.0–100.0)
Monocytes Absolute: 0.8 10*3/uL (ref 0.1–1.0)
Monocytes Relative: 4 % (ref 3–12)
Neutro Abs: 16.2 10*3/uL — ABNORMAL HIGH (ref 1.7–7.7)
Neutrophils Relative %: 85 % — ABNORMAL HIGH (ref 43–77)
Platelets: 233 10*3/uL (ref 150–400)
RBC: 4.98 MIL/uL (ref 4.22–5.81)
RDW: 14.6 % (ref 11.5–15.5)
WBC: 19.2 10*3/uL — ABNORMAL HIGH (ref 4.0–10.5)

## 2013-05-12 LAB — BASIC METABOLIC PANEL
BUN: 18 mg/dL (ref 6–23)
CO2: 25 mEq/L (ref 19–32)
Calcium: 9.2 mg/dL (ref 8.4–10.5)
Chloride: 99 mEq/L (ref 96–112)
Creatinine, Ser: 1.34 mg/dL (ref 0.50–1.35)
GFR calc Af Amer: 70 mL/min — ABNORMAL LOW (ref >90)
GFR calc non Af Amer: 60 mL/min — ABNORMAL LOW (ref >90)
Glucose, Bld: 102 mg/dL — ABNORMAL HIGH (ref 70–99)
Potassium: 3.6 mEq/L (ref 3.5–5.1)
Sodium: 137 mEq/L (ref 135–145)

## 2013-05-12 LAB — TROPONIN I: Troponin I: <0.3 ng/mL (ref <0.30)

## 2013-05-12 MED ORDER — AZITHROMYCIN 250 MG PO TABS: 250.0000 mg | ORAL_TABLET | Freq: Every day | ORAL | Status: DC

## 2013-05-12 MED ORDER — CEFTRIAXONE SODIUM 1 G IJ SOLR
1.0000 g | Freq: Once | INTRAMUSCULAR | Status: AC
  Administered 2013-05-12: 1 g via INTRAMUSCULAR
  Filled 2013-05-12: qty 10

## 2013-05-12 MED ORDER — IBUPROFEN 200 MG PO TABS
600.0000 mg | ORAL_TABLET | Freq: Once | ORAL | Status: AC
  Administered 2013-05-12: 600 mg via ORAL
  Filled 2013-05-12: qty 3

## 2013-05-12 MED ORDER — AZITHROMYCIN 250 MG PO TABS
500.0000 mg | ORAL_TABLET | Freq: Once | ORAL | Status: AC
  Administered 2013-05-12: 500 mg via ORAL
  Filled 2013-05-12: qty 2

## 2013-05-12 MED ORDER — KETOROLAC TROMETHAMINE 30 MG/ML IJ SOLN: 30.0000 mg | Freq: Once | INTRAMUSCULAR | Status: DC

## 2013-05-12 MED ORDER — ALBUTEROL SULFATE HFA 108 (90 BASE) MCG/ACT IN AERS: 1.0000 | INHALATION_SPRAY | Freq: Four times a day (QID) | RESPIRATORY_TRACT | Status: DC | PRN

## 2013-05-12 MED ORDER — ONDANSETRON 4 MG PO TBDP
4.0000 mg | ORAL_TABLET | Freq: Once | ORAL | Status: AC
  Administered 2013-05-12: 4 mg via ORAL
  Filled 2013-05-12: qty 1

## 2013-05-12 MED ORDER — ONDANSETRON HCL 4 MG/2ML IJ SOLN: 4.0000 mg | Freq: Once | INTRAMUSCULAR | Status: DC

## 2013-05-12 MED ORDER — CEFTRIAXONE SODIUM 1 G IJ SOLR: 1.0000 g | Freq: Once | INTRAMUSCULAR | Status: DC

## 2013-05-12 MED ORDER — MORPHINE SULFATE 4 MG/ML IJ SOLN: 6.0000 mg | Freq: Once | INTRAMUSCULAR | Status: DC

## 2013-05-12 MED ORDER — OXYCODONE-ACETAMINOPHEN 5-325 MG PO TABS
2.0000 | ORAL_TABLET | Freq: Once | ORAL | Status: AC
  Administered 2013-05-12: 2 via ORAL
  Filled 2013-05-12: qty 2

## 2013-05-12 MED ORDER — LIDOCAINE HCL (PF) 1 % IJ SOLN
2.1000 mL | Freq: Once | INTRAMUSCULAR | Status: AC
  Administered 2013-05-12: 2.1 mL
  Filled 2013-05-12: qty 4
  Filled 2013-05-12: qty 5

## 2013-05-12 MED ORDER — METOPROLOL TARTRATE 25 MG PO TABS
25.0000 mg | ORAL_TABLET | Freq: Once | ORAL | Status: AC
  Administered 2013-05-12: 25 mg via ORAL
  Filled 2013-05-12: qty 1

## 2013-05-12 MED ORDER — ASPIRIN 81 MG PO CHEW: 324.0000 mg | CHEWABLE_TABLET | Freq: Once | ORAL | Status: DC

## 2013-05-12 MED ORDER — METOPROLOL TARTRATE 1 MG/ML IV SOLN: 5.0000 mg | Freq: Once | INTRAVENOUS | Status: DC

## 2013-05-12 MED ORDER — ALBUTEROL SULFATE (5 MG/ML) 0.5% IN NEBU
5.0000 mg | INHALATION_SOLUTION | Freq: Once | RESPIRATORY_TRACT | Status: AC
  Administered 2013-05-12: 5 mg via RESPIRATORY_TRACT
  Filled 2013-05-12: qty 1

## 2013-05-12 NOTE — ED Provider Notes (Signed)
CSN: [id number]     Arrival date & time 05/12/13  4098 History   First MD Initiated Contact with Patient 05/12/13 [phone]     Chief Complaint  Patient presents with  . Chest Pain   (Consider location/radiation/quality/duration/timing/severity/associated sxs/prior Treatment) HPI  52 year old male with chest pain. Onset about 3 days ago. Initially intermittent, but now more or less constant for the past day. Feels like a tightness in his chest. Does not radiate. Patient is a smoker. Reports recent increased cough which is nonproductive. Mild shortness of breath. No fevers or chills. No nausea or vomiting. Has not noticed any change in this pain with exertion. No palpitations or diaphoresis. No dizziness or lightheadedness. Has not tried taking any medication for his symptoms. Smokes marijuana. Denies any cocaine use. Past history of hypertension, but admits to noncompliance with his medication.  Past Medical History  Diagnosis Date  . Hypertension    Past Surgical History  Procedure Laterality Date  . Shoulder surgery    . Ankle surgery     History reviewed. No pertinent family history. History  Substance Use Topics  . Smoking status: Current Every Day Smoker -- 1.00 packs/day    Types: Cigarettes  . Smokeless tobacco: Never Used  . Alcohol Use: No    Review of Systems  All systems reviewed and negative, other than as noted in HPI.   Allergies  Review of patient's allergies indicates not on file.  Home Medications  No current outpatient prescriptions on file. BP 193/100  Temp(Src) 97.5 F (36.4 C) (Oral)  Resp 22  SpO2 97% Physical Exam  Nursing note and vitals reviewed. Constitutional: He appears well-developed and well-nourished. No distress.  HENT:  Head: Normocephalic and atraumatic.  Eyes: Conjunctivae are normal. Right eye exhibits no discharge. Left eye exhibits no discharge.  Neck: Neck supple.  Cardiovascular: Normal rate, regular rhythm and normal heart  sounds.  Exam reveals no gallop and no friction rub.   No murmur heard. Pulmonary/Chest: No respiratory distress. He exhibits tenderness.  Faint expiratory wheezing b/l. R sided rhonchi. Tenderness to palpation L mid-to-upper sternum/parasternally.   Abdominal: Soft. He exhibits no distension. There is no tenderness.  Musculoskeletal: He exhibits no edema and no tenderness.       Arms: Lower extremities symmetric as compared to each other. No calf tenderness. Negative Homan's. No palpable cords.   Neurological: He is alert.  Skin: Skin is warm and dry.  Psychiatric: He has a normal mood and affect. His behavior is normal. Thought content normal.    ED Course  Procedures (including critical care time) Labs Review Labs Reviewed  CBC WITH DIFFERENTIAL - Abnormal; Notable for the following:    WBC 19.2 (*)    Neutrophils Relative % 85 (*)    Neutro Abs 16.2 (*)    Lymphocytes Relative 11 (*)    All other components within normal limits  BASIC METABOLIC PANEL - Abnormal; Notable for the following:    Glucose, Bld 102 (*)    GFR calc non Af Amer 60 (*)    GFR calc Af Amer 70 (*)    All other components within normal limits  TROPONIN I   Imaging Review Dg Chest 2 View  05/12/2013   CLINICAL DATA:  Chest pain  EXAM: CHEST - 2 VIEW  COMPARISON:  None available  FINDINGS: Focal infiltrate or atelectasis in the lateral and posterior basal segments right lower lobe. Left lung clear. No effusion. Heart size upper limits normal. Mildly tortuous aorta.  Regional bones unremarkable. No pneumothorax.  IMPRESSION: Right lower lobe subsegmental atelectasis or pneumonia.   Electronically Signed   By: Oley Balm M.D.   On: 05/12/2013 08:59    EKG Interpretation    Date/Time:  Saturday May 12 2013 07:31:42 EST Ventricular Rate:  87 PR Interval:  147 QRS Duration: 99 QT Interval:  503 QTC Calculation: 605 R Axis:   14 Text Interpretation:  Normal sinus rhythm Left ventricular  hypertrophy Abnormal T, mutiple leads. Suspect "strain" from LVH versus less likely ischemia Prolonged QT interval No previous tracing Confirmed by Coleston Dirosa  MD, Miraj Truss ([id number]) on 05/12/2013 10:12:07 AM            MDM   1. CAP (community acquired pneumonia)   2. Chest pain     52 year old male with chest pain. Atypical for ACS given reproducibility with palpation, general character of it and constant duration. Patient does not have a normal EKG. Suspect repolarization abnormalities secondary to left ventricular hypertrophy is. Patient very hypertensive in the emergency room. Reports recent blood pressures within the same range. He is noncompliant with his blood pressure medication. CXR with R sided infiltrate and which clinically corresponds with R sided rhonchi. Pt is not hypoxic, mentation ok, not hypotensive and renal function ok. I feel he is appropriate for outpt tx. Encouraged to quit smoking and be compliant with his other medications. PRN albuterol and continued azithromycin. Outpt FU. Emergent return     Raeford Razor, MD 05/12/13 [phone]

## 2013-05-12 NOTE — ED Notes (Signed)
EMS attempted IV x2.  2 RNs attempted IV x3.  MD notified.

## 2013-05-12 NOTE — ED Notes (Signed)
Unable to establish peripheral IV access, attempted by 2 RN's. EDP notified.

## 2013-05-12 NOTE — ED Notes (Addendum)
Pt from [facility] via [facility] c/o of right central chest pain starting last night.  Tender on palpation. Non- radiating, denies SOB.  Given 324 mg aspirin.  Pt in NAD, A&O.

## 2013-05-14 ENCOUNTER — Encounter (HOSPITAL_COMMUNITY): Payer: Self-pay | Admitting: *Deleted

## 2013-05-14 NOTE — Progress Notes (Signed)
ED CM received Medication sheet from [facility]  In FT, The EDP is  unable to change medication for a less costly medication . Contacted patient regarding his ability to afford his medications.  Pt states, he is unable to afford the cost of these medicines. He is not working. Pt eleigibe for MATCH. Enrolled in [facility] program. Letter was printed and at patient's request it was faxed to [facility] at on [facility] at [phone]. No further CM needs identified

## 2013-05-14 NOTE — Progress Notes (Signed)
Incoming call from patient who reports cost issue with ED prescriptions from 05/12/13 visit. Medication request sheet completed and placed in file in fast track.No further Case Manager needs.

## 2013-06-26 ENCOUNTER — Encounter (HOSPITAL_COMMUNITY): Payer: Self-pay | Admitting: Emergency Medicine

## 2013-06-26 ENCOUNTER — Emergency Department (HOSPITAL_COMMUNITY)
Admission: EM | Admit: 2013-06-26 | Discharge: 2013-06-26 | Disposition: A | Discharge status: Home / Self Care | Payer: Self-pay | Attending: Emergency Medicine | Admitting: Emergency Medicine

## 2013-06-26 DIAGNOSIS — F3289 Other specified depressive episodes: Secondary | ICD-10-CM | POA: Insufficient documentation

## 2013-06-26 DIAGNOSIS — F17200 Nicotine dependence, unspecified, uncomplicated: Secondary | ICD-10-CM | POA: Insufficient documentation

## 2013-06-26 DIAGNOSIS — F329 Major depressive disorder, single episode, unspecified: Secondary | ICD-10-CM | POA: Insufficient documentation

## 2013-06-26 DIAGNOSIS — L259 Unspecified contact dermatitis, unspecified cause: Secondary | ICD-10-CM | POA: Insufficient documentation

## 2013-06-26 DIAGNOSIS — Z79899 Other long term (current) drug therapy: Secondary | ICD-10-CM | POA: Insufficient documentation

## 2013-06-26 DIAGNOSIS — I10 Essential (primary) hypertension: Secondary | ICD-10-CM | POA: Insufficient documentation

## 2013-06-26 DIAGNOSIS — M109 Gout, unspecified: Secondary | ICD-10-CM | POA: Insufficient documentation

## 2013-06-26 DIAGNOSIS — F489 Nonpsychotic mental disorder, unspecified: Secondary | ICD-10-CM | POA: Insufficient documentation

## 2013-06-26 DIAGNOSIS — L309 Dermatitis, unspecified: Secondary | ICD-10-CM

## 2013-06-26 MED ORDER — HYDROCORTISONE 0.5 % EX CREA: 1.0000 "application " | TOPICAL_CREAM | Freq: Two times a day (BID) | CUTANEOUS | Status: DC

## 2013-06-26 NOTE — ED Provider Notes (Signed)
CSN: [id number]     Arrival date & time 06/26/13  1639 History  This chart was scribed for non-physician practitioner Lucila Maine, PA-C working with Virgel Manifold, MD by Rolanda Lundborg, ED Scribe. This patient was seen in room TR07C/TR07C and the patient's care was started at 5:05 PM.      Chief Complaint  Patient presents with  . Rash    The history is provided by the patient. No language interpreter was used.    HPI Comments: Chris [name] is a 53 y.o. male with a h/o HTN, gout, depression who presents to the Emergency Department complaining of an itchy rash on his neck and face with associated redness and swelling onset several months ago.  Patient states it started after he used a [employer]'s dye on his beard.  He states that the rash has improved since onset but has not resolved.  He has been putting OTC antibacterial ointment on it with no relief.  He went to [facility] and was given a cream but states he could not afford it. He denies any hx of any skin reactions in the past.  He denies any allergies in the past.  Rash is localized to his beard and neck.  No open wounds or worse.  He denies fevers, neck pain, vomiting, diarrhea, dyspnea, cough, lip swelling, wheezing, headache, chest pain, or SOB.  Patient's BP elevated during his ED visit.  Patient states he has his BP medications, but is not taking them.     Past Medical History  Diagnosis Date  . Gout   . Mental disorder   . Depression   . Hypertension    Past Surgical History  Procedure Laterality Date  . Shoulder surgery    . Ankle surgery     History reviewed. No pertinent family history. History  Substance Use Topics  . Smoking status: Current Every Day Smoker -- 1.00 packs/day    Types: Cigarettes  . Smokeless tobacco: Never Used  . Alcohol Use: No    Review of Systems  Constitutional: Negative for fever and fatigue.  Respiratory: Negative for cough.   Gastrointestinal: Negative for vomiting and  diarrhea.  Musculoskeletal: Negative for neck pain and neck stiffness.  Skin: Positive for color change and rash. Negative for wound.  All other systems reviewed and are negative.   Allergies  Review of patient's allergies indicates no known allergies.  Home Medications   Current Outpatient Rx  Name  Route  Sig  Dispense  Refill  . albuterol (PROVENTIL HFA;VENTOLIN HFA) 108 (90 BASE) MCG/ACT inhaler   Inhalation   Inhale 1-2 puffs into the lungs every 6 (six) hours as needed for wheezing or shortness of breath.   1 Inhaler   0   . cloNIDine (CATAPRES) 0.3 MG tablet   Oral   Take 1 tablet (0.3 mg total) by mouth 2 (two) times daily. For high blood pressure   60 tablet   0   . FLUoxetine (PROZAC) 20 MG capsule   Oral   Take 1 capsule (20 mg total) by mouth daily. For depression and anxiety.   30 capsule   0   . ibuprofen (ADVIL,MOTRIN) 200 MG tablet   Oral   Take 600 mg by mouth every 6 (six) hours as needed for moderate pain.          [name] lisinopril (PRINIVIL,ZESTRIL) 30 MG tablet   Oral   Take 1 tablet (30 mg total) by mouth every morning. For  hypertension.   30 tablet   0   . meloxicam (MOBIC) 15 MG tablet   Oral   Take 1 tablet (15 mg total) by mouth daily. For osteoarthritis and low back pain.   30 tablet   0    BP 175/110  Pulse 80  Temp(Src) 98.6 F (37 C) (Oral)  Resp 19  Ht 6' (1.829 m)  Wt 223 lb (101.152 kg)  BMI 30.24 kg/m2  SpO2 98%  Filed Vitals:   06/26/13 1649 06/26/13 1753  BP: 174/98 175/110  Pulse: 80 80  Temp: 98.3 F (36.8 C) 98.6 F (37 C)  TempSrc: Oral Oral  Resp: 18 19  Height: 6' (1.829 m)   Weight: 223 lb (101.152 kg)   SpO2: 99% 98%    Physical Exam  Nursing note and vitals reviewed. Constitutional: He is oriented to person, place, and time. He appears well-developed and well-nourished. No distress.  HENT:  Head: Normocephalic and atraumatic.    Right Ear: External ear normal.  Left Ear: External ear normal.   Nose: Nose normal.  Mouth/Throat: Oropharynx is clear and moist. No oropharyngeal exudate.  Tympanic membranes gray and translucent bilaterally with no erythema, edema, or hemotympanum. No erythema to the posterior pharynx. No sores to the oral cavity. Uvula midline. No trismus.    Eyes: Conjunctivae and EOM are normal. Pupils are equal, round, and reactive to light. Right eye exhibits no discharge. Left eye exhibits no discharge.  Neck: Normal range of motion. Neck supple. No tracheal deviation present.  No cervical spinal or paraspinal tenderness to palpation throughout.  No limitations with neck ROM.    Cardiovascular: Normal rate, regular rhythm and normal heart sounds.  Exam reveals no gallop and no friction rub.   No murmur heard. Pulmonary/Chest: Effort normal and breath sounds normal. No respiratory distress. He has no wheezes. He has no rales. He exhibits no tenderness.  Abdominal: Soft. He exhibits no distension. There is no tenderness.  Musculoskeletal: Normal range of motion. He exhibits no edema and no tenderness.  Neurological: He is alert and oriented to person, place, and time.  Skin: Skin is warm and dry. He is not diaphoretic. There is erythema.  Erythema surrounding the beard on the face and anterior and posterior neck throughout, which is not warm to the touch.  Overlying dry rough skin.  No open wounds or lacerations.    Psychiatric: He has a normal mood and affect. His behavior is normal.    ED Course  Procedures (including critical care time) Medications - No data to display  DIAGNOSTIC STUDIES: Oxygen Saturation is 98% on RA, normal by my interpretation.    COORDINATION OF CARE: 6:39 PM- Discussed treatment plan with pt. Pt agrees to plan.   Labs Review Labs Reviewed - No data to display Imaging Review No results found.  EKG Interpretation   None       MDM   Chris [name] is a 53 y.o. male with a h/o HTN, gout, depression who presents to the  Emergency Department complaining of an itchy rash on his neck and face with associated redness and swelling onset several months ago.  Etiology of rash possibly due to a irritant vs contact vs allergic dermatitis from using [employer].  Rash benign in appearance.  Do not suspect cellulitis.  Patient afebrile and non-toxic in appearance.  Called pharmacy regarding hydrocortisone on the face and they recommended low potency.  Also instructed patient to try emmollient creams and ointments  to help with symptomatic relief.  Patient instructed to follow-up with a PCP for further evaluation and management and take his BP medications as scheduled.  Return precautions, discharge instructions, and follow-up was discussed with the patient before discharge.        Discharge Medication List as of 06/26/2013  6:57 PM    START taking these medications   Details  hydrocortisone cream 0.5 % Apply 1 application topically 2 (two) times daily., Starting 06/26/2013, Until Discontinued, Print        Final impressions: 1. Dermatitis   2. Hypertension      Mercy Moore PA-C   I personally performed the services described in this documentation, which was scribed in my presence. The recorded information has been reviewed and is accurate.        Lucila Maine, PA-C 06/27/13 [phone]

## 2013-06-26 NOTE — ED Notes (Signed)
Pt presents to department for evaluation of rash to neck. Ongoing for several months. No relief with creams and medications. Pt states itching and discomfort. Respirations unlabored. No signs of acute distress noted.

## 2013-06-26 NOTE — Discharge Instructions (Signed)
Apply hydrocortisone cream once daily to affected area  Apply an ointment to keep skin moist - Aquaphor or Vaseline Keep area clean and dry  Take your blood pressure medications  Return to the emergency department if you develop any changing/worsening condition, spreading redness/swelling, fever, wounds, chest pain, difficulty breathing, vision changes, or any other concerns (please read additional information regarding your condition below)      Contact Dermatitis Contact dermatitis is a reaction to certain substances that touch the skin. Contact dermatitis can be either irritant contact dermatitis or allergic contact dermatitis. Irritant contact dermatitis does not require previous exposure to the substance for a reaction to occur.Allergic contact dermatitis only occurs if you have been exposed to the substance before. Upon a repeat exposure, your body reacts to the substance.  CAUSES  Many substances can cause contact dermatitis. Irritant dermatitis is most commonly caused by repeated exposure to mildly irritating substances, such as:  Makeup.  Soaps.  Detergents.  Bleaches.  Acids.   Metal salts, such as nickel. Allergic contact dermatitis is most commonly caused by exposure to:  Poisonous plants.  Chemicals (deodorants, shampoos).  Jewelry.  Latex.  Neomycin in triple antibiotic cream.  Preservatives in products, including clothing. SYMPTOMS  The area of skin that is exposed may develop:  Dryness or flaking.  Redness.  Cracks.  Itching.  Pain or a burning sensation.  Blisters. With allergic contact dermatitis, there may also be swelling in areas such as the eyelids, mouth, or genitals.  DIAGNOSIS  Your caregiver can usually tell what the problem is by doing a physical exam. In cases where the cause is uncertain and an allergic contact dermatitis is suspected, a patch skin test may be performed to help determine the cause of your  dermatitis. TREATMENT Treatment includes protecting the skin from further contact with the irritating substance by avoiding that substance if possible. Barrier creams, powders, and gloves may be helpful. Your caregiver may also recommend:  Steroid creams or ointments applied 2 times daily. For best results, soak the rash area in cool water for 20 minutes. Then apply the medicine. Cover the area with a plastic wrap. You can store the steroid cream in the refrigerator for a "chilly" effect on your rash. That may decrease itching. Oral steroid medicines may be needed in more severe cases.  Antibiotics or antibacterial ointments if a skin infection is present.  Antihistamine lotion or an antihistamine taken by mouth to ease itching.  Lubricants to keep moisture in your skin.  Burow's solution to reduce redness and soreness or to dry a weeping rash. Mix one packet or tablet of solution in 2 cups cool water. Dip a clean washcloth in the mixture, wring it out a bit, and put it on the affected area. Leave the cloth in place for 30 minutes. Do this as often as possible throughout the day.  Taking several cornstarch or baking soda baths daily if the area is too large to cover with a washcloth. Harsh chemicals, such as alkalis or acids, can cause skin damage that is like a burn. You should flush your skin for 15 to 20 minutes with cold water after such an exposure. You should also seek immediate medical care after exposure. Bandages (dressings), antibiotics, and pain medicine may be needed for severely irritated skin.  HOME CARE INSTRUCTIONS  Avoid the substance that caused your reaction.  Keep the area of skin that is affected away from hot water, soap, sunlight, chemicals, acidic substances, or anything else that  would irritate your skin.  Do not scratch the rash. Scratching may cause the rash to become infected.  You may take cool baths to help stop the itching.  Only take over-the-counter or  prescription medicines as directed by your caregiver.  See your caregiver for follow-up care as directed to make sure your skin is healing properly. SEEK MEDICAL CARE IF:   Your condition is not better after 3 days of treatment.  You seem to be getting worse.  You see signs of infection such as swelling, tenderness, redness, soreness, or warmth in the affected area.  You have any problems related to your medicines. Document Released: 05/28/2000 Document Revised: 08/23/2011 Document Reviewed: 11/03/2010 [facility] Patient Information 2014 [place], [place].  Hypertension As your heart beats, it forces blood through your arteries. This force is your blood pressure. If the pressure is too high, it is called hypertension (HTN) or high blood pressure. HTN is dangerous because you may have it and not know it. High blood pressure may mean that your heart has to work harder to pump blood. Your arteries may be narrow or stiff. The extra work puts you at risk for heart disease, stroke, and other problems.  Blood pressure consists of two numbers, a higher number over a lower, 110/72, for example. It is stated as "110 over 72." The ideal is below 120 for the top number (systolic) and under 80 for the bottom (diastolic). Write down your blood pressure today. You should pay close attention to your blood pressure if you have certain conditions such as:  Heart failure.  Prior heart attack.  Diabetes  Chronic kidney disease.  Prior stroke.  Multiple risk factors for heart disease. To see if you have HTN, your blood pressure should be measured while you are seated with your arm held at the level of the heart. It should be measured at least twice. A one-time elevated blood pressure reading (especially in the Emergency Department) does not mean that you need treatment. There may be conditions in which the blood pressure is different between your right and left arms. It is important to see your caregiver soon  for a recheck. Most people have essential hypertension which means that there is not a specific cause. This type of high blood pressure may be lowered by changing lifestyle factors such as:  Stress.  Smoking.  Lack of exercise.  Excessive weight.  Drug/tobacco/alcohol use.  Eating less salt. Most people do not have symptoms from high blood pressure until it has caused damage to the body. Effective treatment can often prevent, delay or reduce that damage. TREATMENT  When a cause has been identified, treatment for high blood pressure is directed at the cause. There are a large number of medications to treat HTN. These fall into several categories, and your caregiver will help you select the medicines that are best for you. Medications may have side effects. You should review side effects with your caregiver. If your blood pressure stays high after you have made lifestyle changes or started on medicines,   Your medication(s) may need to be changed.  Other problems may need to be addressed.  Be certain you understand your prescriptions, and know how and when to take your medicine.  Be sure to follow up with your caregiver within the time frame advised (usually within two weeks) to have your blood pressure rechecked and to review your medications.  If you are taking more than one medicine to lower your blood pressure, make sure you  know how and at what times they should be taken. Taking two medicines at the same time can result in blood pressure that is too low. SEEK IMMEDIATE MEDICAL CARE IF:  You develop a severe headache, blurred or changing vision, or confusion.  You have unusual weakness or numbness, or a faint feeling.  You have severe chest or abdominal pain, vomiting, or breathing problems. MAKE SURE YOU:   Understand these instructions.  Will watch your condition.  Will get help right away if you are not doing well or get worse. Document Released: 05/31/2005 Document  Revised: 08/23/2011 Document Reviewed: 01/19/2008 [facility] Patient Information 2014 [place].   Emergency Department Resource Guide 1) Find a Doctor and Pay Out of Pocket Although you won't have to find out who is covered by your insurance plan, it is a good idea to ask around and get recommendations. You will then need to call the office and see if the doctor you have chosen will accept you as a new patient and what types of options they offer for patients who are self-pay. Some doctors offer discounts or will set up payment plans for their patients who do not have insurance, but you will need to ask so you aren't surprised when you get to your appointment.  2) Contact Your Local Health Department Not all health departments have doctors that can see patients for sick visits, but many do, so it is worth a call to see if yours does. If you don't know where your local health department is, you can check in your phone book. The CDC also has a tool to help you locate your state's health department, and many state websites also have listings of all of their local health departments.  3) Find a [facility] If your illness is not likely to be very severe or complicated, you may want to try a walk in clinic. These are popping up all over the country in pharmacies, drugstores, and shopping centers. They're usually staffed by nurse practitioners or physician assistants that have been trained to treat common illnesses and complaints. They're usually fairly quick and inexpensive. However, if you have serious medical issues or chronic medical problems, these are probably not your best option.  No Primary Care Doctor: - Call Health Connect at  [phone] - they can help you locate a primary care doctor that  accepts your insurance, provides certain services, etc. - Physician Referral Service- [phone]  Chronic Pain Problems: Organization         Address  Phone   Notes  [facility]  [phone] Patients need to be referred by their primary care doctor.   Medication Assistance: Organization         Address  Phone   Notes  [facility] Medication [facility] [address] [place], [place] [address] [phone] --Must be a resident of [facility] -- Must have NO insurance coverage whatsoever (no Medicaid/ Medicare, etc.) -- The pt. MUST have a primary care doctor that directs their care regularly and follows them in the community   MedAssist  [phone]   [employer]  [phone]    Agencies that provide inexpensive medical care: Organization         Address  Phone   Notes  [place]  [phone]   [facility]    [phone]   [facility] [place], [place] [address] [phone]   [place] [address], [place] [phone]   Planned Parenthood    [phone]   [facility]    [phone]   [place] and [place] [address], [place] Phone:  [phone], Fax:  [phone] Hours of Operation:  9 am - 6 pm, M-F.  Also accepts Medicaid/Medicare and self-pay.  [facility] for [place], [address], [place] Phone: [phone], Fax: [phone]. Hours of Operation:  8:30 am - 5:30 pm, M-F.  Also accepts Medicaid and self-pay.  [facility] [place] [address], [place] Phone: [phone]   [place], [place], [place] [phone], Ext. [phone] Mondays & Thursdays: 7-9 AM.  First 15 patients are seen on a first come, first serve basis.    [place] Providers:  Organization         Address  Phone   Notes  [facility] [address],  [phone] Also accepts self-pay patients.  [facility] [id number] [place], [place]  [phone]   [place], [address], [place] [phone]   [facility] [address] [place] [phone]   Lucianne Lei [address], [place]   [phone] Only accepts [place] Access [place] patients after they have their name applied to their card.   Self-Pay (no insurance) in [facility]:  Organization         Address  Phone   Notes  Sickle Cell Patients, [facility] Internal Medicine [place] [phone]   [facility] Urgent Care [place] [phone]   [facility] [place]  [place], [place], [place] [phone]   [facility]/Dr. Osei-Bonsu  [address], [place] or [address], [place] [phone] Phone number for both [place] and [place] locations is the same.  Urgent [facility] and [facility] [address], [place] [phone]   [facility] [address], [place] or [address] [phone]   [facility] [address] [place] [phone], phone; [phone], fax Sees patients 1st and 3rd Saturday of every month.  Must not qualify for public or private insurance (i.e. Medicaid, Medicare, [place] Health Choice, Veterans' Benefits)  Household income should be no more than 200% of the poverty level The clinic cannot treat you if you are pregnant or think you are pregnant  Sexually transmitted diseases are not treated at the clinic.    Dental Care: Organization         Address  Phone  Notes  [facility] Department of [facility] [place] [phone] Accepts children up to age 19 who are enrolled in [place] or [place]; pregnant women with a Medicaid card; and children who have applied for Medicaid or [place] Health  Choice, but were declined, whose parents can pay a reduced fee at time of service.  [employer] Department of [facility]  [address], [place] [phone] Accepts children up to age 85 who are enrolled in [place] or [place]; pregnant women with  a Medicaid card; and children who have applied for Medicaid or [place] Health Choice, but were declined, whose parents can pay a reduced fee at time of service.  [facility] Access PRO[facility]  [place] [phone] Patients are seen by appointment only. Walk-ins are not accepted. [place] will see patients 1 years of age and older. Monday - Tuesday (8am-5pm) Most Wednesdays (8:30-5pm) $30 per visit, cash only  [facility] Access PRO[facility]  [address], [facility] [phone] Patients are seen by appointment only. Walk-ins are not accepted. [place] will see patients 53 years of age and older. One Wednesday Evening (Monthly: Volunteer Based).  $30 per visit, cash only  [place]  [phone] for adults; Children under age 30, call Graduate Pediatric Dentistry at [phone]. Children aged 68-14, please call [phone] to request a pediatric application.  Dental services are provided in all areas of dental care including fillings, crowns and bridges, complete and partial dentures, implants, gum treatment, root canals, and extractions. Preventive care is also provided. Treatment is provided to both adults and children. Patients are selected via a lottery and there is often a waiting list.   [facility] [address], [place]  [phone] [email or web address]   [facility] [address] [place], [place] [phone], Ext. [phone] Second and Fourth Thursday of each month, opens at 6:30 AM; Clinic ends at 9 AM.  Patients are seen on a first-come first-served basis, and a limited number are seen during each  clinic.   [facility]  [address] Hillard Danker [place], [place] [phone]   Eligibility Requirements You must have lived in [place], [place], or [place] counties for at least the last three months.   You cannot be eligible for state or federal sponsored [employer], including [employer], [place], or [employer].   You generally cannot be eligible for healthcare insurance through your employer.    How to apply: Eligibility screenings are held every Tuesday and Wednesday afternoon from 1:00 pm until 4:00 pm. You do not need an appointment for the interview!  [facility] [address], [place], [place]   [place]  [place] Department  [place]  [phone]    Behavioral Health Resources in the Community: Intensive Outpatient Programs Organization         Address  Phone  Notes  [place]. [address] [place], [place] [phone]   [facility] Outpatient [address] [place], [place]   ADS: Alcohol & Drug Svcs [address] [place], [place]   [place] [address]  [place], [place] or [phone]   Substance Abuse Resources Organization         Address  Phone  Notes  Alcohol and Drug Services  [phone]   [place]  [phone]   The [place]   Chinita Pester  [phone]   Residential & Outpatient Substance Abuse Program  [phone]   Psychological Services Organization         Address  Phone  Notes  [facility] [place]  [place]  [phone]   [place] [address], [place] or [phone]    Mobile Crisis Teams Organization  Address  Phone  Notes  [facility]rapeutic Alternatives, Mobile  Crisis Care Unit  [phone]   [facility]ive Psychotherapeutic Services  [address] [place], [place]   [facility] [address], [place] [phone]    Self-Help/Support Groups Organization         Address  Phone             Notes  [place] of [place] - variety of support groups  [place] Call for more information  Narcotics Anonymous (NA), [facility] [address], [employer] [place]  2 meetings at this location   Special educational needs teacher         Address  Phone  Notes  [facility]idential [facility] [place],    [place]  [phone]   [facility]  [address] [place] [address], [place], [place]   [place], [place] [phone] Admissions: 8am-3pm M-F  Incentives Substance [place] [phone],    [place], [place] [phone]   [facility] [address] [place], [place], [place]   [facility] [address],  [place], [place]   [facility] Programs - Intensive Outpatient [address]een [address], [place], [place]   [facility] [place].,  [place], [place] [phone] or [phone]   [facility] Services (RTS) [address], [place], [place] Accepts Medicaid  [facility] [place] [address]  [place] [phone] Substance Abuse/Addiction Treatment   [facility] Organization         Address  Phone  Notes  [facility]  [phone]   Domenic Schwab, PhD [address] Arlis Porta [place], [place]   [phone] or [phone]   [place], [place] [phone]   [facility] [address], [place], [place] [phone] Insurance/Medicaid/sponsorship through [facility] and Families [address], [address]  [place]                                    [place], [place] [phone] [place] [address], [place] [phone]    Dr. Adele Schilder  [phone]   [facility] Dept. 1) [address], [place] 2) [place] 3)  [place] [address], [place] [phone]  [phone]   [place] [phone] or [phone] (After Hours)

## 2013-06-29 NOTE — ED Provider Notes (Signed)
Medical screening examination/treatment/procedure(s) were performed by non-physician practitioner and as supervising physician I was immediately available for consultation/collaboration.  EKG Interpretation   None        Dossie Ocanas, MD 06/29/13 2156 

## 2013-10-20 ENCOUNTER — Inpatient Hospital Stay: Payer: Self-pay | Admitting: Student

## 2013-10-20 LAB — CBC
HCT: 44.7 % (ref 40.0–52.0)
HGB: 14.5 g/dL (ref 13.0–18.0)
MCH: 29.8 pg (ref 26.0–34.0)
MCHC: 32.5 g/dL (ref 32.0–36.0)
MCV: 92 fL (ref 80–100)
Platelet: 201 10*3/uL (ref 150–440)
RBC: 4.88 10*6/uL (ref 4.40–5.90)
RDW: 15.8 % — ABNORMAL HIGH (ref 11.5–14.5)
WBC: 10.2 10*3/uL (ref 3.8–10.6)

## 2013-10-20 LAB — CK TOTAL AND CKMB (NOT AT ARMC)
CK, TOTAL: 179 U/L
CK, TOTAL: 170 U/L
CK, Total: 167 U/L
CK-MB: 2.1 ng/mL (ref 0.5–3.6)
CK-MB: 2 ng/mL (ref 0.5–3.6)
CK-MB: 1.8 ng/mL (ref 0.5–3.6)

## 2013-10-20 LAB — COMPREHENSIVE METABOLIC PANEL
ALBUMIN: 3.1 g/dL — AB (ref 3.4–5.0)
ALK PHOS: 90 U/L
AST: 34 U/L (ref 15–37)
Anion Gap: 5 — ABNORMAL LOW (ref 7–16)
BUN: 20 mg/dL — ABNORMAL HIGH (ref 7–18)
Bilirubin,Total: 0.6 mg/dL (ref 0.2–1.0)
CREATININE: 1.47 mg/dL — AB (ref 0.60–1.30)
Calcium, Total: 8.8 mg/dL (ref 8.5–10.1)
Chloride: 109 mmol/L — ABNORMAL HIGH (ref 98–107)
Co2: 26 mmol/L (ref 21–32)
EGFR (African American): >60
EGFR (Non-African Amer.): 54 — ABNORMAL LOW
Glucose: 84 mg/dL (ref 65–99)
OSMOLALITY: 281 (ref 275–301)
Potassium: 4.6 mmol/L (ref 3.5–5.1)
SGPT (ALT): 69 U/L (ref 12–78)
Sodium: 140 mmol/L (ref 136–145)
Total Protein: 6.7 g/dL (ref 6.4–8.2)

## 2013-10-20 LAB — DRUG SCREEN, URINE
Amphetamines, Ur Screen: NEGATIVE (ref ?–1000)
Barbiturates, Ur Screen: NEGATIVE (ref ?–200)
Benzodiazepine, Ur Scrn: NEGATIVE (ref ?–200)
COCAINE METABOLITE, UR ~~LOC~~: POSITIVE (ref ?–300)
Cannabinoid 50 Ng, Ur ~~LOC~~: NEGATIVE (ref ?–50)
MDMA (Ecstasy)Ur Screen: NEGATIVE (ref ?–500)
METHADONE, UR SCREEN: NEGATIVE (ref ?–300)
OPIATE, UR SCREEN: NEGATIVE (ref ?–300)
PHENCYCLIDINE (PCP) UR S: NEGATIVE (ref ?–25)
Tricyclic, Ur Screen: NEGATIVE (ref ?–1000)

## 2013-10-20 LAB — TROPONIN I
TROPONIN-I: 0.07 ng/mL — AB
Troponin-I: 0.07 ng/mL — ABNORMAL HIGH
Troponin-I: 0.04 ng/mL

## 2013-10-20 LAB — PRO B NATRIURETIC PEPTIDE: B-TYPE NATIURETIC PEPTID: 6832 pg/mL — AB (ref 0–125)

## 2013-10-21 LAB — BASIC METABOLIC PANEL
Anion Gap: 4 — ABNORMAL LOW (ref 7–16)
BUN: 34 mg/dL — ABNORMAL HIGH (ref 7–18)
CREATININE: 1.73 mg/dL — AB (ref 0.60–1.30)
Calcium, Total: 9 mg/dL (ref 8.5–10.1)
Chloride: 103 mmol/L (ref 98–107)
Co2: 30 mmol/L (ref 21–32)
EGFR (African American): 52 — ABNORMAL LOW
GFR CALC NON AF AMER: 45 — AB
Glucose: 141 mg/dL — ABNORMAL HIGH (ref 65–99)
Osmolality: 284 (ref 275–301)
POTASSIUM: 4.4 mmol/L (ref 3.5–5.1)
SODIUM: 137 mmol/L (ref 136–145)

## 2013-10-21 LAB — CBC WITH DIFFERENTIAL/PLATELET
BASOS PCT: 0.2 %
Basophil #: 0 10*3/uL (ref 0.0–0.1)
EOS PCT: 0 %
Eosinophil #: 0 10*3/uL (ref 0.0–0.7)
HCT: 43.1 % (ref 40.0–52.0)
HGB: 13.8 g/dL (ref 13.0–18.0)
LYMPHS ABS: 1.1 10*3/uL (ref 1.0–3.6)
LYMPHS PCT: 10.5 %
MCH: 29.2 pg (ref 26.0–34.0)
MCHC: 31.9 g/dL — AB (ref 32.0–36.0)
MCV: 92 fL (ref 80–100)
Monocyte #: 0.6 x10 3/mm (ref 0.2–1.0)
Monocyte %: 5.9 %
Neutrophil #: 9.1 10*3/uL — ABNORMAL HIGH (ref 1.4–6.5)
Neutrophil %: 83.4 %
Platelet: 210 10*3/uL (ref 150–440)
RBC: 4.72 10*6/uL (ref 4.40–5.90)
RDW: 15.7 % — ABNORMAL HIGH (ref 11.5–14.5)
WBC: 10.9 10*3/uL — ABNORMAL HIGH (ref 3.8–10.6)

## 2013-10-21 LAB — LIPID PANEL
Cholesterol: 157 mg/dL (ref 0–200)
HDL Cholesterol: 30 mg/dL — ABNORMAL LOW (ref 40–60)
Ldl Cholesterol, Calc: 104 mg/dL — ABNORMAL HIGH (ref 0–100)
TRIGLYCERIDES: 117 mg/dL (ref 0–200)
VLDL Cholesterol, Calc: 23 mg/dL (ref 5–40)

## 2013-10-21 LAB — TSH: Thyroid Stimulating Horm: 0.32 u[IU]/mL — ABNORMAL LOW

## 2013-10-22 LAB — BASIC METABOLIC PANEL
Anion Gap: 5 — ABNORMAL LOW (ref 7–16)
BUN: 34 mg/dL — ABNORMAL HIGH (ref 7–18)
CO2: 28 mmol/L (ref 21–32)
Calcium, Total: 9.2 mg/dL (ref 8.5–10.1)
Chloride: 104 mmol/L (ref 98–107)
Creatinine: 1.94 mg/dL — ABNORMAL HIGH (ref 0.60–1.30)
EGFR (African American): 45 — ABNORMAL LOW
GFR CALC NON AF AMER: 39 — AB
Glucose: 121 mg/dL — ABNORMAL HIGH (ref 65–99)
OSMOLALITY: 283 (ref 275–301)
Potassium: 4.8 mmol/L (ref 3.5–5.1)
Sodium: 137 mmol/L (ref 136–145)

## 2013-10-22 LAB — CREATININE, URINE, RANDOM: CREATININE, URINE RANDOM: 75.4 mg/dL (ref 30.0–125.0)

## 2013-10-22 LAB — PROTEIN URINE, QUAL: Protein: 30

## 2013-10-23 LAB — BASIC METABOLIC PANEL
ANION GAP: 6 — AB (ref 7–16)
BUN: 39 mg/dL — AB (ref 7–18)
CO2: 27 mmol/L (ref 21–32)
Calcium, Total: 9 mg/dL (ref 8.5–10.1)
Chloride: 104 mmol/L (ref 98–107)
Creatinine: 1.76 mg/dL — ABNORMAL HIGH (ref 0.60–1.30)
EGFR (African American): 51 — ABNORMAL LOW
GFR CALC NON AF AMER: 44 — AB
Glucose: 115 mg/dL — ABNORMAL HIGH (ref 65–99)
Osmolality: 284 (ref 275–301)
Potassium: 4.9 mmol/L (ref 3.5–5.1)
SODIUM: 137 mmol/L (ref 136–145)

## 2013-10-23 LAB — UR PROT ELECTROPHORESIS, URINE RANDOM

## 2013-10-24 LAB — PROTEIN ELECTROPHORESIS(ARMC)

## 2013-10-29 ENCOUNTER — Ambulatory Visit: Payer: Self-pay | Admitting: Family

## 2014-01-08 ENCOUNTER — Ambulatory Visit: Payer: Self-pay | Admitting: Family

## 2014-01-11 ENCOUNTER — Ambulatory Visit: Payer: Self-pay | Admitting: Family

## 2014-01-21 ENCOUNTER — Ambulatory Visit: Payer: Self-pay | Admitting: Family

## 2014-02-11 ENCOUNTER — Ambulatory Visit: Payer: Self-pay | Admitting: Family

## 2014-03-06 ENCOUNTER — Encounter (HOSPITAL_COMMUNITY): Payer: Self-pay | Admitting: Emergency Medicine

## 2014-03-06 ENCOUNTER — Emergency Department (HOSPITAL_COMMUNITY)
Admission: EM | Admit: 2014-03-06 | Discharge: 2014-03-06 | Disposition: A | Discharge status: Home / Self Care | Payer: Self-pay | Attending: Emergency Medicine | Admitting: Emergency Medicine

## 2014-03-06 DIAGNOSIS — IMO0002 Reserved for concepts with insufficient information to code with codable children: Secondary | ICD-10-CM | POA: Insufficient documentation

## 2014-03-06 DIAGNOSIS — Z791 Long term (current) use of non-steroidal anti-inflammatories (NSAID): Secondary | ICD-10-CM | POA: Insufficient documentation

## 2014-03-06 DIAGNOSIS — L259 Unspecified contact dermatitis, unspecified cause: Secondary | ICD-10-CM | POA: Insufficient documentation

## 2014-03-06 DIAGNOSIS — L03113 Cellulitis of right upper limb: Secondary | ICD-10-CM

## 2014-03-06 DIAGNOSIS — Z8639 Personal history of other endocrine, nutritional and metabolic disease: Secondary | ICD-10-CM | POA: Insufficient documentation

## 2014-03-06 DIAGNOSIS — Z792 Long term (current) use of antibiotics: Secondary | ICD-10-CM | POA: Insufficient documentation

## 2014-03-06 DIAGNOSIS — Z79899 Other long term (current) drug therapy: Secondary | ICD-10-CM | POA: Insufficient documentation

## 2014-03-06 DIAGNOSIS — L239 Allergic contact dermatitis, unspecified cause: Secondary | ICD-10-CM

## 2014-03-06 DIAGNOSIS — F329 Major depressive disorder, single episode, unspecified: Secondary | ICD-10-CM | POA: Insufficient documentation

## 2014-03-06 DIAGNOSIS — L03114 Cellulitis of left upper limb: Secondary | ICD-10-CM

## 2014-03-06 DIAGNOSIS — R21 Rash and other nonspecific skin eruption: Secondary | ICD-10-CM | POA: Insufficient documentation

## 2014-03-06 DIAGNOSIS — I10 Essential (primary) hypertension: Secondary | ICD-10-CM | POA: Insufficient documentation

## 2014-03-06 DIAGNOSIS — F17200 Nicotine dependence, unspecified, uncomplicated: Secondary | ICD-10-CM | POA: Insufficient documentation

## 2014-03-06 DIAGNOSIS — F3289 Other specified depressive episodes: Secondary | ICD-10-CM | POA: Insufficient documentation

## 2014-03-06 DIAGNOSIS — Z862 Personal history of diseases of the blood and blood-forming organs and certain disorders involving the immune mechanism: Secondary | ICD-10-CM | POA: Insufficient documentation

## 2014-03-06 MED ORDER — PREDNISONE 20 MG PO TABS: ORAL_TABLET | ORAL | Status: DC

## 2014-03-06 MED ORDER — CEPHALEXIN 500 MG PO CAPS: 500.0000 mg | ORAL_CAPSULE | Freq: Two times a day (BID) | ORAL | Status: DC

## 2014-03-06 MED ORDER — CYCLOBENZAPRINE HCL 10 MG PO TABS: 10.0000 mg | ORAL_TABLET | Freq: Two times a day (BID) | ORAL | Status: DC | PRN

## 2014-03-06 NOTE — ED Provider Notes (Signed)
CSN: [id number]     Arrival date & time 03/06/14  1557 History  This chart was scribed for non-physician practitioner, Noland Fordyce, PA-C,working with Wandra Arthurs, MD, by Marlowe Kays, ED Scribe. This patient was seen in room TR06C/TR06C and the patient's care was started at 5:07 PM.  Chief Complaint  Patient presents with  . Rash   Patient is a 53 y.o. male presenting with rash. The history is provided by the patient. No language interpreter was used.  Rash Associated symptoms: no fever, no nausea and not vomiting    HPI Comments:  Chris [name] is a 53 y.o. male who presents to the Emergency Department complaining of an itchy rash to the bilateral upper extremities that appeared approximately two weeks ago. Pt reports getting new tattoos to the areas three weeks ago. Three to four days after that pt reports contracting poison ivy. He now reports "bubbling" of the skin. He reports applying several Neosporin and calamine lotion to the area with no significant relief of the symptoms. He denies fever, chills, nausea or vomiting. He has no medication allergies.  Past Medical History  Diagnosis Date  . Gout   . Mental disorder   . Depression   . Hypertension    Past Surgical History  Procedure Laterality Date  . Shoulder surgery    . Ankle surgery     History reviewed. No pertinent family history. History  Substance Use Topics  . Smoking status: Current Every Day Smoker -- 1.00 packs/day    Types: Cigarettes  . Smokeless tobacco: Never Used  . Alcohol Use: No    Review of Systems  Constitutional: Negative for fever and chills.  Gastrointestinal: Negative for nausea and vomiting.  Skin: Positive for rash.  All other systems reviewed and are negative.   Allergies  Review of patient's allergies indicates no known allergies.  Home Medications   Prior to Admission medications   Medication Sig Start Date End Date Taking? Authorizing Provider  albuterol (PROVENTIL  HFA;VENTOLIN HFA) 108 (90 BASE) MCG/ACT inhaler Inhale 1-2 puffs into the lungs every 6 (six) hours as needed for wheezing or shortness of breath. 05/12/13   Virgel Manifold, MD  cephALEXin (KEFLEX) 500 MG capsule Take 1 capsule (500 mg total) by mouth 2 (two) times daily. For 7 days. 03/06/14   Noland Fordyce, PA-C  cloNIDine (CATAPRES) 0.3 MG tablet Take 1 tablet (0.3 mg total) by mouth 2 (two) times daily. For high blood pressure 02/21/13   Nena Polio, PA-C  FLUoxetine (PROZAC) 20 MG capsule Take 1 capsule (20 mg total) by mouth daily. For depression and anxiety. 02/21/13   Nena Polio, PA-C  hydrocortisone cream 0.5 % Apply 1 application topically 2 (two) times daily. 06/26/13   Lucila Maine, PA-C  ibuprofen (ADVIL,MOTRIN) 200 MG tablet Take 600 mg by mouth every 6 (six) hours as needed for moderate pain.     Historical Provider, MD  lisinopril (PRINIVIL,ZESTRIL) 30 MG tablet Take 1 tablet (30 mg total) by mouth every morning. For hypertension. 02/21/13   Nena Polio, PA-C  meloxicam (MOBIC) 15 MG tablet Take 1 tablet (15 mg total) by mouth daily. For osteoarthritis and low back pain. 02/21/13   Nena Polio, PA-C  predniSONE (DELTASONE) 20 MG tablet 3 tabs po day one, then 2 po daily x 4 days 03/06/14   Noland Fordyce, PA-C   Triage Vitals: BP 161/81  Pulse 84  Temp(Src) 98.4 F (36.9 C) (Oral)  Resp 24  Ht 6' (1.829  m)  Wt 237 lb (107.502 kg)  BMI 32.14 kg/m2  SpO2 97% Physical Exam  Nursing note and vitals reviewed. Constitutional: He is oriented to person, place, and time. He appears well-developed and well-nourished.  HENT:  Head: Normocephalic and atraumatic.  Eyes: EOM are normal.  Neck: Normal range of motion.  Cardiovascular: Normal rate.   Pulmonary/Chest: Effort normal.  Musculoskeletal: Normal range of motion.  Neurological: He is alert and oriented to person, place, and time.  Skin: Skin is warm and dry. Rash noted. There is erythema.  Bilateral forearms with erythema  and edema. Mild tenderness to palpation. Areas of excoriation. No active discharge. Rash is overlying new appearing tattoos.  Psychiatric: He has a normal mood and affect. His behavior is normal.    ED Course  Procedures (including critical care time) DIAGNOSTIC STUDIES: Oxygen Saturation is 97% on RA, normal by my interpretation.   COORDINATION OF CARE: 5:10 PM- Will prescribe prednisone and Keflex. Will give pt work note for 5 days until pt can follow up with PCP. Informed pt to take Benadryl for itching. Pt verbalizes understanding and agrees to plan.  Medications - No data to display  Labs Review Labs Reviewed - No data to display  Imaging Review No results found.   EKG Interpretation None      MDM   Final diagnoses:  Allergic dermatitis  Cellulitis of left upper extremity  Cellulitis of right upper extremity    Pt is a 53yo male c/o rash to bilateral forearms. Vitals: WNL. Rash consistent with cellulitis and contact dermatitis.  No systemic symptoms.  Will tx with prednisone and keflex. Home care instructions provided. Advised to f/u with PCP in 3-4 days for recheck of symptoms. Return precautions provided. Pt verbalized understanding and agreement with tx plan.   I personally performed the services described in this documentation, which was scribed in my presence. The recorded information has been reviewed and is accurate.     Noland Fordyce, PA-C 03/06/14 2221

## 2014-03-06 NOTE — ED Notes (Signed)
Pt reports having red rash and itching to right arm x 3 weeks, pt thinks its possible poison ivy since he works outside and rash is itching. Also had recent tattoo done to right forearm which also looks red and infected. Denies fever.

## 2014-03-06 NOTE — ED Notes (Signed)
Pt. Stated, I was working outside and i got in to some poison ivy on top of a fresh tattoo.

## 2014-03-06 NOTE — ED Notes (Signed)
PA at the bedside.

## 2014-03-06 NOTE — ED Provider Notes (Signed)
Medical screening examination/treatment/procedure(s) were performed by non-physician practitioner and as supervising physician I was immediately available for consultation/collaboration.   EKG Interpretation None        Wandra Arthurs, MD 03/06/14 [phone]

## 2014-03-06 NOTE — ED Notes (Signed)
Right forearm red and inflamed .

## 2014-03-06 NOTE — ED Notes (Signed)
Pt reports he is not willing to stay for anything other than FT, only wants PO meds.

## 2014-03-19 ENCOUNTER — Emergency Department (HOSPITAL_COMMUNITY)
Admission: EM | Admit: 2014-03-19 | Discharge: 2014-03-20 | Disposition: A | Discharge status: Home / Self Care | Payer: Self-pay | Attending: Emergency Medicine | Admitting: Emergency Medicine

## 2014-03-19 ENCOUNTER — Encounter (HOSPITAL_COMMUNITY): Payer: Self-pay | Admitting: Emergency Medicine

## 2014-03-19 DIAGNOSIS — T5492XA Toxic effect of unspecified corrosive substance, intentional self-harm, initial encounter: Secondary | ICD-10-CM | POA: Insufficient documentation

## 2014-03-19 DIAGNOSIS — F339 Major depressive disorder, recurrent, unspecified: Secondary | ICD-10-CM | POA: Diagnosis present

## 2014-03-19 DIAGNOSIS — I10 Essential (primary) hypertension: Secondary | ICD-10-CM | POA: Insufficient documentation

## 2014-03-19 DIAGNOSIS — F1410 Cocaine abuse, uncomplicated: Secondary | ICD-10-CM | POA: Diagnosis present

## 2014-03-19 DIAGNOSIS — F333 Major depressive disorder, recurrent, severe with psychotic symptoms: Secondary | ICD-10-CM

## 2014-03-19 DIAGNOSIS — F1210 Cannabis abuse, uncomplicated: Secondary | ICD-10-CM | POA: Diagnosis present

## 2014-03-19 DIAGNOSIS — Z792 Long term (current) use of antibiotics: Secondary | ICD-10-CM | POA: Insufficient documentation

## 2014-03-19 DIAGNOSIS — Z791 Long term (current) use of non-steroidal anti-inflammatories (NSAID): Secondary | ICD-10-CM | POA: Insufficient documentation

## 2014-03-19 DIAGNOSIS — T1491XA Suicide attempt, initial encounter: Secondary | ICD-10-CM

## 2014-03-19 DIAGNOSIS — F1290 Cannabis use, unspecified, uncomplicated: Secondary | ICD-10-CM

## 2014-03-19 DIAGNOSIS — F1490 Cocaine use, unspecified, uncomplicated: Secondary | ICD-10-CM

## 2014-03-19 DIAGNOSIS — Z7952 Long term (current) use of systemic steroids: Secondary | ICD-10-CM | POA: Insufficient documentation

## 2014-03-19 DIAGNOSIS — F330 Major depressive disorder, recurrent, mild: Secondary | ICD-10-CM

## 2014-03-19 DIAGNOSIS — F329 Major depressive disorder, single episode, unspecified: Secondary | ICD-10-CM | POA: Insufficient documentation

## 2014-03-19 DIAGNOSIS — M109 Gout, unspecified: Secondary | ICD-10-CM | POA: Insufficient documentation

## 2014-03-19 DIAGNOSIS — Z720 Tobacco use: Secondary | ICD-10-CM | POA: Insufficient documentation

## 2014-03-19 LAB — CBC
HEMATOCRIT: 42.7 % (ref 39.0–52.0)
HEMOGLOBIN: 14.7 g/dL (ref 13.0–17.0)
MCH: 31.3 pg (ref 26.0–34.0)
MCHC: 34.4 g/dL (ref 30.0–36.0)
MCV: 90.9 fL (ref 78.0–100.0)
PLATELETS: 225 10*3/uL (ref 150–400)
RBC: 4.7 MIL/uL (ref 4.22–5.81)
RDW: 14.3 % (ref 11.5–15.5)
WBC: 14.5 10*3/uL — AB (ref 4.0–10.5)

## 2014-03-19 LAB — COMPREHENSIVE METABOLIC PANEL
ALT: 17 U/L (ref 0–53)
AST: 20 U/L (ref 0–37)
Albumin: 3.9 g/dL (ref 3.5–5.2)
Alkaline Phosphatase: 67 U/L (ref 39–117)
Anion gap: 14 (ref 5–15)
BILIRUBIN TOTAL: 0.5 mg/dL (ref 0.3–1.2)
BUN: 24 mg/dL — AB (ref 6–23)
CHLORIDE: 98 meq/L (ref 96–112)
CO2: 24 meq/L (ref 19–32)
CREATININE: 1.58 mg/dL — AB (ref 0.50–1.35)
Calcium: 9.6 mg/dL (ref 8.4–10.5)
GFR, EST AFRICAN AMERICAN: 57 mL/min — AB (ref >90)
GFR, EST NON AFRICAN AMERICAN: 49 mL/min — AB (ref >90)
GLUCOSE: 99 mg/dL (ref 70–99)
Potassium: 4 mEq/L (ref 3.7–5.3)
Sodium: 136 mEq/L — ABNORMAL LOW (ref 137–147)
Total Protein: 8 g/dL (ref 6.0–8.3)

## 2014-03-19 LAB — ETHANOL

## 2014-03-19 LAB — RAPID URINE DRUG SCREEN, HOSP PERFORMED
Amphetamines: NOT DETECTED
BARBITURATES: NOT DETECTED
BENZODIAZEPINES: NOT DETECTED
Cocaine: POSITIVE — AB
Opiates: NOT DETECTED
Tetrahydrocannabinol: POSITIVE — AB

## 2014-03-19 LAB — SALICYLATE LEVEL: Salicylate Lvl: <2 mg/dL — ABNORMAL LOW (ref 2.8–20.0)

## 2014-03-19 LAB — ACETAMINOPHEN LEVEL: Acetaminophen (Tylenol), Serum: <15 ug/mL (ref 10–30)

## 2014-03-19 LAB — TROPONIN I

## 2014-03-19 MED ORDER — ONDANSETRON HCL 4 MG PO TABS: 4.0000 mg | ORAL_TABLET | Freq: Three times a day (TID) | ORAL | Status: DC | PRN

## 2014-03-19 MED ORDER — NICOTINE 21 MG/24HR TD PT24: 21.0000 mg | MEDICATED_PATCH | Freq: Every day | TRANSDERMAL | Status: DC

## 2014-03-19 MED ORDER — LORAZEPAM 1 MG PO TABS
1.0000 mg | ORAL_TABLET | Freq: Three times a day (TID) | ORAL | Status: DC | PRN
  Administered 2014-03-19 – 2014-03-20 (×3): 1 mg via ORAL
  Filled 2014-03-19 (×3): qty 1

## 2014-03-19 MED ORDER — CLONIDINE HCL 0.1 MG PO TABS
0.3000 mg | ORAL_TABLET | Freq: Two times a day (BID) | ORAL | Status: DC
  Administered 2014-03-19: 0.3 mg via ORAL
  Filled 2014-03-19: qty 3

## 2014-03-19 MED ORDER — ACETAMINOPHEN 325 MG PO TABS
650.0000 mg | ORAL_TABLET | ORAL | Status: DC | PRN
  Filled 2014-03-19: qty 2

## 2014-03-19 MED ORDER — IBUPROFEN 200 MG PO TABS: 600.0000 mg | ORAL_TABLET | Freq: Three times a day (TID) | ORAL | Status: DC | PRN

## 2014-03-19 MED ORDER — FLUOXETINE HCL 20 MG PO CAPS
20.0000 mg | ORAL_CAPSULE | Freq: Every day | ORAL | Status: DC
  Administered 2014-03-19: 20 mg via ORAL
  Filled 2014-03-19: qty 1

## 2014-03-19 MED ORDER — ALUM & MAG HYDROXIDE-SIMETH 200-200-20 MG/5ML PO SUSP: 30.0000 mL | ORAL | Status: DC | PRN

## 2014-03-19 MED ORDER — ALBUTEROL SULFATE HFA 108 (90 BASE) MCG/ACT IN AERS
1.0000 | INHALATION_SPRAY | Freq: Four times a day (QID) | RESPIRATORY_TRACT | Status: DC | PRN
  Administered 2014-03-20: 1 via RESPIRATORY_TRACT
  Filled 2014-03-19: qty 6.7

## 2014-03-19 MED ORDER — ALBUTEROL SULFATE HFA 108 (90 BASE) MCG/ACT IN AERS: 1.0000 | INHALATION_SPRAY | Freq: Four times a day (QID) | RESPIRATORY_TRACT | Status: DC | PRN

## 2014-03-19 MED ORDER — HYDROCORTISONE 0.5 % EX CREA
1.0000 "application " | TOPICAL_CREAM | Freq: Two times a day (BID) | CUTANEOUS | Status: DC
  Administered 2014-03-19: 1 via TOPICAL
  Filled 2014-03-19: qty 28.35

## 2014-03-19 MED ORDER — LISINOPRIL 20 MG PO TABS
30.0000 mg | ORAL_TABLET | Freq: Every morning | ORAL | Status: DC
  Administered 2014-03-19: 30 mg via ORAL
  Filled 2014-03-19: qty 1

## 2014-03-19 MED ORDER — ZOLPIDEM TARTRATE 5 MG PO TABS
5.0000 mg | ORAL_TABLET | Freq: Every evening | ORAL | Status: DC | PRN
  Administered 2014-03-19: 5 mg via ORAL
  Filled 2014-03-19: qty 1

## 2014-03-19 NOTE — ED Provider Notes (Signed)
CSN: [id number]     Arrival date & time 03/19/14  0003 History   First MD Initiated Contact with Patient 03/19/14 0009     Chief Complaint  Patient presents with  . Suicidal     (Consider location/radiation/quality/duration/timing/severity/associated sxs/prior Treatment) HPI Comments: Patient is a 53 year old male with a past medical history of hypertension and depression who presents with [facility] under IVC after a suicide attempt at home. Patient reports feeling sad lately and wanting to hurt himself. Patient reports, this morning, he went and bought "every drug possible" and used them. He reports buying a crack rock, marijuana, cocaine, etc. Patient also reported wanted to kill himself by drinking bleach so he drank 1 cup of Clorox at home. Patient's girlfriend witnessed this and called the police. Patient denies HI.    Past Medical History  Diagnosis Date  . Gout   . Mental disorder   . Depression   . Hypertension    Past Surgical History  Procedure Laterality Date  . Shoulder surgery    . Ankle surgery     History reviewed. No pertinent family history. History  Substance Use Topics  . Smoking status: Current Every Day Smoker -- 1.00 packs/day    Types: Cigarettes  . Smokeless tobacco: Never Used  . Alcohol Use: No    Review of Systems  Constitutional: Negative for fever, chills and fatigue.  HENT: Negative for trouble swallowing.   Eyes: Negative for visual disturbance.  Respiratory: Negative for shortness of breath.   Cardiovascular: Negative for chest pain and palpitations.  Gastrointestinal: Negative for nausea, vomiting, abdominal pain and diarrhea.  Genitourinary: Negative for dysuria and difficulty urinating.  Musculoskeletal: Negative for arthralgias and neck pain.  Skin: Negative for color change.  Neurological: Negative for dizziness and weakness.  Psychiatric/Behavioral: Positive for suicidal ideas and self-injury. Negative for dysphoric mood.       Allergies  Review of patient's allergies indicates no known allergies.  Home Medications   Prior to Admission medications   Medication Sig Start Date End Date Taking? Authorizing Provider  albuterol (PROVENTIL HFA;VENTOLIN HFA) 108 (90 BASE) MCG/ACT inhaler Inhale 1-2 puffs into the lungs every 6 (six) hours as needed for wheezing or shortness of breath. 05/12/13   Virgel Manifold, MD  cephALEXin (KEFLEX) 500 MG capsule Take 1 capsule (500 mg total) by mouth 2 (two) times daily. For 7 days. 03/06/14   Noland Fordyce, PA-C  cloNIDine (CATAPRES) 0.3 MG tablet Take 1 tablet (0.3 mg total) by mouth 2 (two) times daily. For high blood pressure 02/21/13   Nena Polio, PA-C  FLUoxetine (PROZAC) 20 MG capsule Take 1 capsule (20 mg total) by mouth daily. For depression and anxiety. 02/21/13   Nena Polio, PA-C  hydrocortisone cream 0.5 % Apply 1 application topically 2 (two) times daily. 06/26/13   Lucila Maine, PA-C  ibuprofen (ADVIL,MOTRIN) 200 MG tablet Take 600 mg by mouth every 6 (six) hours as needed for moderate pain.     Historical Provider, MD  lisinopril (PRINIVIL,ZESTRIL) 30 MG tablet Take 1 tablet (30 mg total) by mouth every morning. For hypertension. 02/21/13   Nena Polio, PA-C  meloxicam (MOBIC) 15 MG tablet Take 1 tablet (15 mg total) by mouth daily. For osteoarthritis and low back pain. 02/21/13   Nena Polio, PA-C  predniSONE (DELTASONE) 20 MG tablet 3 tabs po day one, then 2 po daily x 4 days 03/06/14   Noland Fordyce, PA-C   BP 176/108  Pulse 86  Temp(Src)  98.1 F (36.7 C) (Oral)  Resp 18  SpO2 99% Physical Exam  Nursing note and vitals reviewed. Constitutional: He is oriented to person, place, and time. He appears well-developed and well-nourished. No distress.  HENT:  Head: Normocephalic and atraumatic.  Eyes: Conjunctivae and EOM are normal.  Neck: Normal range of motion.  Cardiovascular: Normal rate and regular rhythm.  Exam reveals no gallop and no friction  rub.   No murmur heard. Pulmonary/Chest: Effort normal and breath sounds normal. He has no wheezes. He has no rales. He exhibits no tenderness.  Abdominal: Soft. He exhibits no distension. There is no tenderness. There is no rebound.  Musculoskeletal: Normal range of motion.  Neurological: He is alert and oriented to person, place, and time. Coordination normal.  Speech is goal-oriented. Moves limbs without ataxia.   Skin: Skin is warm and dry.  Psychiatric: He has a normal mood and affect. His behavior is normal.    ED Course  Procedures (including critical care time) Labs Review Labs Reviewed  CBC - Abnormal; Notable for the following:    WBC 14.5 (*)    All other components within normal limits  ACETAMINOPHEN LEVEL  COMPREHENSIVE METABOLIC PANEL  ETHANOL  SALICYLATE LEVEL  URINE RAPID DRUG SCREEN (HOSP PERFORMED)  TROPONIN I    Imaging Review No results found.   EKG Interpretation   Date/Time:  Tuesday March 19 2014 00:20:08 EDT Ventricular Rate:  87 PR Interval:  134 QRS Duration: 98 QT Interval:  418 QTC Calculation: 503 R Axis:   36 Text Interpretation:  Sinus rhythm Probable left atrial enlargement  Nonspecific T abnormalities, lateral leads Borderline ST elevation,  anterior leads Prolonged QT interval No significant change since last  tracing Confirmed by OTTER  MD, OLGA ([id number]) on 03/19/2014 12:23:35 AM      MDM   Final diagnoses:  Suicide attempt    12:46 AM Labs pending. Vitals stable and patient afebrile.     Alvina Chou, [place] 03/19/14 [phone]
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
## 2014-03-19 NOTE — BH Assessment (Signed)
Inpt recommended, [facility] at capacity. Sent pt to the following with male beds available: [place], [place] Triage Specialist 03/19/2014 8:34 PM

## 2014-03-19 NOTE — ED Notes (Signed)
Pt here under IVC, states that he is aggressive and suicidal, he is going to kill himself from swallowing chlorox.
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
## 2014-03-19 NOTE — ED Notes (Addendum)
Pt transferred from triage, escorted by [employer], transferred from [facility].  Pt reports he ingested unknown amount of bleach earlier tonight, time unknown.  Pt reports SI, denies HI, hearing voices telling him to kill himself and seeing images of a man with horns chasing him.  Pt reports he has attempted SI in the past, feeling hopeless.  Pt reports diagnosed with Schizophrenia, Depression.  Pt cooperative, tearful and anxious at present.  Admits to crack cocaine use also.  Will monitor for safety.

## 2014-03-19 NOTE — BHH Counselor (Deleted)
Darlene at [facility] they have low acuity male beds. Writer faxed referral to [place].    Arnold Long, [place] Assessment Counselor

## 2014-03-19 NOTE — Progress Notes (Signed)
[place],  Provided pt with a [employer], highlighting [facility], to help patient establish primary care. Patient was sleeping, information was left on bedside table.

## 2014-03-20 ENCOUNTER — Encounter (HOSPITAL_COMMUNITY): Payer: Self-pay | Admitting: Registered Nurse

## 2014-03-20 ENCOUNTER — Inpatient Hospital Stay (HOSPITAL_COMMUNITY): Admission: AD | Admit: 2014-03-20 | Payer: Self-pay | Source: Intra-hospital | Admitting: Psychiatry

## 2014-03-20 MED ORDER — CARBAMAZEPINE 200 MG PO TABS
200.0000 mg | ORAL_TABLET | Freq: Two times a day (BID) | ORAL | Status: DC
  Filled 2014-03-20 (×2): qty 1

## 2014-03-20 MED ORDER — TRAZODONE HCL 100 MG PO TABS: 100.0000 mg | ORAL_TABLET | Freq: Every evening | ORAL | Status: DC | PRN

## 2014-03-20 MED ORDER — AMLODIPINE BESYLATE 10 MG PO TABS
10.0000 mg | ORAL_TABLET | Freq: Every day | ORAL | Status: DC
  Administered 2014-03-20: 10 mg via ORAL
  Filled 2014-03-20: qty 1

## 2014-03-20 MED ORDER — CARBAMAZEPINE 200 MG PO TABS: 200.0000 mg | ORAL_TABLET | Freq: Two times a day (BID) | ORAL | Status: DC

## 2014-03-20 MED ORDER — FLUOXETINE HCL 20 MG PO CAPS
20.0000 mg | ORAL_CAPSULE | Freq: Every day | ORAL | Status: DC
  Administered 2014-03-20: 20 mg via ORAL
  Filled 2014-03-20: qty 1

## 2014-03-20 MED ORDER — WHITE PETROLATUM GEL
Status: DC | PRN
  Filled 2014-03-20: qty 5

## 2014-03-20 MED ORDER — HYDRALAZINE HCL 25 MG PO TABS
25.0000 mg | ORAL_TABLET | Freq: Three times a day (TID) | ORAL | Status: DC | PRN
  Filled 2014-03-20: qty 1

## 2014-03-20 NOTE — BHH Counselor (Signed)
TC from [place] at [facility]. She reports pt would have to provide $3000 up front before admission. Writer passed along this info to [place].  Arnold Long, [place] Assessment Counselor

## 2014-03-20 NOTE — BH Assessment (Signed)
Patient on list for [facility], no beds as of this moment yet discharges expected. AC E Deatra Ina will keep TTS informed.  Sheilah Pigeon, LCSW

## 2014-03-20 NOTE — Consult Note (Signed)
  Patient request another meeting.  Patient states that he is not suicidal and that he really needs help with rehab and his substance abuse.  Informed patient that I would give him a list of places that he could call and also discussed [facility] for outpatient services.  Patient called [facility] and was accepted.  Patient states that if he can get there today he has a bed.    Patient denies suicidal/homicidal ideation, psychosis, and paranoia  Patient will be discharge home and follow up with inpatient treatment at the [facility].  Shuvon B. Rankin FNP-BC
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
## 2014-03-20 NOTE — ED Notes (Signed)
Patient states he will take BP meds at home. In NAD.

## 2014-03-20 NOTE — Consult Note (Signed)
Face to face evaluation and I agree with this note 

## 2014-03-20 NOTE — [facility] Notes (Signed)
Shared with patient information regarding [facility] acceptance contingent on pt's ability to pay fee prior to admission. Patient declined as $3000 fee is not doable. Will continue to look for placement. Sheilah Pigeon, LCSW

## 2014-03-20 NOTE — Discharge Instructions (Signed)
Chemical Dependency Chemical dependency is an addiction to drugs or alcohol. It is characterized by the repeated behavior of seeking out and using drugs and alcohol despite harmful consequences to the health and safety of ones self and others.  RISK FACTORS There are certain situations or behaviors that increase a person's risk for chemical dependency. These include:  A family history of chemical dependency.  A history of mental health issues, including depression and anxiety.  A home environment where drugs and alcohol are easily available to you.  Drug or alcohol use at a young age. SYMPTOMS  The following symptoms can indicate chemical dependency:  Inability to limit the use of drugs or alcohol.  Nausea, sweating, shakiness, and anxiety that occurs when alcohol or drugs are not being used.  An increase in amount of drugs or alcohol that is necessary to get drunk or high. People who experience these symptoms can assess their use of drugs and alcohol by asking themselves the following questions:  Have you been told by friends or family that they are worried about your use of alcohol or drugs?  Do friends and family ever tell you about things you did while drinking alcohol or using drugs that you do not remember?  Do you lie about using alcohol or drugs or about the amounts you use?  Do you have difficulty completing daily tasks unless you use alcohol or drugs?  Is the level of your work or school performance lower because of your drug or alcohol use?  Do you get sick from using drugs or alcohol but keep using anyway?  Do you feel uncomfortable in social situations unless you use alcohol or drugs?  Do you use drugs or alcohol to help forget problems? An answer of yes to any of these questions may indicate chemical dependency. Professional evaluation is suggested. Document Released: 05/25/2001 Document Revised: 08/23/2011 Document Reviewed: 08/06/2010 [facility] Patient  Information 2015 [place], [place]. This information is not intended to replace advice given to you by your health care provider. Make sure you discuss any questions you have with your health care provider.  Drug Abuse and Addiction in Sports There are many types of drugs that one may become addicted to including illegal drugs (marijuana, cocaine, amphetamines, hallucinogens, and narcotics), prescription drugs (hydrocodone, codeine, and alprazolam), and other chemicals such as alcohol or nicotine. Two types of addiction exist: physical and emotional. Physical addiction usually occurs after prolonged use of a drug. However, some drugs may only take a couple uses before addiction can occur. Physical addiction is marked by withdrawal symptoms in which the person experiences negative symptoms such as sweat, anxiety, tremors, hallucinations, or cravings in the absence of using the drug. Emotional dependence is the psychological desire for the "high" that the drugs produce when taken. SYMPTOMS   Inattentiveness.  Negligence.  Forgetfulness.  Insomnia.  Mood swings. RISK INCREASES WITH:   Family history of addiction.  Personal history of addictive personality. Studies have shown that risk takers, which many athletes are, have a higher risk of addiction. PREVENTION The only adequate prevention of drug abuse is abstinence from drugs. TREATMENT  The first step in quitting substance abuse is recognizing the problem and realizing that one has the power to change. Quitting requires a plan and support from others. It is often necessary to seek medical assistance. Caregivers are available to offer counseling, and for certain cases, medicine to diminish the physical symptoms of withdrawal. Many organizations exist such as Alcoholics Anonymous, Narcotics Anonymous, or the  [employer] on Alcoholism that offer support for individuals who have chosen to quit their habits. Document Released: 05/31/2005  Document Revised: 10/15/2013 Document Reviewed: 09/12/2008 [facility] Patient Information 2015 [place], [place]. This information is not intended to replace advice given to you by your health care provider. Make sure you discuss any questions you have with your health care provider.  Major Depressive Disorder Major depressive disorder is a mental illness. It also may be called clinical depression or unipolar depression. Major depressive disorder usually causes feelings of sadness, hopelessness, or helplessness. Some people with this disorder do not feel particularly sad but lose interest in doing things they used to enjoy (anhedonia). Major depressive disorder also can cause physical symptoms. It can interfere with work, school, relationships, and other normal everyday activities. The disorder varies in severity but is longer lasting and more serious than the sadness we all feel from time to time in our lives. Major depressive disorder often is triggered by stressful life events or major life changes. Examples of these triggers include divorce, loss of your job or home, a move, and the death of a family member or close friend. Sometimes this disorder occurs for no obvious reason at all. People who have family members with major depressive disorder or bipolar disorder are at higher risk for developing this disorder, with or without life stressors. Major depressive disorder can occur at any age. It may occur just once in your life (single episode major depressive disorder). It may occur multiple times (recurrent major depressive disorder). SYMPTOMS People with major depressive disorder have either anhedonia or depressed mood on nearly a daily basis for at least 2 weeks or longer. Symptoms of depressed mood include:  Feelings of sadness (blue or down in the dumps) or emptiness.  Feelings of hopelessness or helplessness.  Tearfulness or episodes of crying (may be observed by others).  Irritability (children  and adolescents). In addition to depressed mood or anhedonia or both, people with this disorder have at least four of the following symptoms:  Difficulty sleeping or sleeping too much.   Significant change (increase or decrease) in appetite or weight.   Lack of energy or motivation.  Feelings of guilt and worthlessness.   Difficulty concentrating, remembering, or making decisions.  Unusually slow movement (psychomotor retardation) or restlessness (as observed by others).   Recurrent wishes for death, recurrent thoughts of self-harm (suicide), or a suicide attempt. People with major depressive disorder commonly have persistent negative thoughts about themselves, other people, and the world. People with severe major depressive disorder may experiencedistorted beliefs or perceptions about the world (psychotic delusions). They also may see or hear things that are not real (psychotic hallucinations). DIAGNOSIS Major depressive disorder is diagnosed through an assessment by your health care provider. Your health care provider will ask aboutaspects of your daily life, such as mood,sleep, and appetite, to see if you have the diagnostic symptoms of major depressive disorder. Your health care provider may ask about your medical history and use of alcohol or drugs, including prescription medicines. Your health care provider also may do a physical exam and blood work. This is because certain medical conditions and the use of certain substances can cause major depressive disorder-like symptoms (secondary depression). Your health care provider also may refer you to a mental health specialist for further evaluation and treatment. TREATMENT It is important to recognize the symptoms of major depressive disorder and seek treatment. The following treatments can be prescribed for this disorder:   Medicine. Antidepressant medicines  usually are prescribed. Antidepressant medicines are thought to correct  chemical imbalances in the brain that are commonly associated with major depressive disorder. Other types of medicine may be added if the symptoms do not respond to antidepressant medicines alone or if psychotic delusions or hallucinations occur.  Talk therapy. Talk therapy can be helpful in treating major depressive disorder by providing support, education, and guidance. Certain types of talk therapy also can help with negative thinking (cognitive behavioral therapy) and with relationship issues that trigger this disorder (interpersonal therapy). A mental health specialist can help determine which treatment is best for you. Most people with major depressive disorder do well with a combination of medicine and talk therapy. Treatments involving electrical stimulation of the brain can be used in situations with extremely severe symptoms or when medicine and talk therapy do not work over time. These treatments include electroconvulsive therapy, transcranial magnetic stimulation, and vagal nerve stimulation. Document Released: 09/25/2012 Document Revised: 10/15/2013 Document Reviewed: 09/25/2012 [facility] Patient Information 2015 [place], [place]. This information is not intended to replace advice given to you by your health care provider. Make sure you discuss any questions you have with your health care provider.

## 2014-03-20 NOTE — ED Notes (Signed)
Patient demanding at times and tearful at others "I'm not really suicidal, I just need help with my addiction.  I have more to offer."  Difficult to redirect on unit but shows some insight into issues and states he is motivated for treatment.  NA schedule offerd and 12 step concepts reenforced.  Support offered.  Continue current plan and updat providers.

## 2014-03-20 NOTE — [facility] Suicide Risk Assessment (Cosign Needed)
Suicide Risk Assessment  Discharge Assessment     Demographic Factors:  Male  Total Time spent with patient: 30 minutes  Psychiatric Specialty Exam:     Blood pressure 175/99, pulse 68, temperature 97.9 F (36.6 C), temperature source Oral, resp. rate 16, SpO2 100.00%.There is no weight on file to calculate BMI.  General Appearance: Casual  Eye Contact::  Good  Speech:  Clear and Coherent  Volume:  Normal  Mood:  Anxious  Affect:  Appropriate  Thought Process:  Circumstantial and Goal Directed  Orientation:  Full (Time, Place, and Person)  Thought Content:  Rumination  Suicidal Thoughts:  No  Homicidal Thoughts:  No  Memory:  Immediate;   Good Recent;   Good Remote;   Good  Judgement:  Intact  Insight:  Fair and Present  Psychomotor Activity:  Normal  Concentration:  Fair  Recall:  Good  Fund of Knowledge:Good  Language: Good  Akathisia:  No  Handed:  Right  AIMS (if indicated):     Assets:  Communication Skills Desire for Improvement Social Support  Sleep:       Musculoskeletal: Strength & Muscle Tone: within normal limits Gait & Station: normal Patient leans: N/A   Mental Status Per Nursing Assessment::   On Admission:     Current Mental Status by Physician: Patient denies sucidal/homicidal ideation, psychoisis, and paranoia  Loss Factors: NA  Historical Factors: NA  Risk Reduction Factors:   Sense of responsibility to family and Positive social support  Continued Clinical Symptoms:  Alcohol/Substance Abuse/Dependencies  Cognitive Features That Contribute To Risk:  None noted    Suicide Risk:  Minimal: No identifiable suicidal ideation.  Patients presenting with no risk factors but with morbid ruminations; may be classified as minimal risk based on the severity of the depressive symptoms  Discharge Diagnoses:   AXIS I:  Substance Abuse and Substance Induced Mood Disorder AXIS II:  Deferred AXIS III:   Past Medical History  Diagnosis  Date  . Gout   . Mental disorder   . Depression   . Hypertension    AXIS IV:  other psychosocial or environmental problems and problems related to social environment AXIS V:  61-70 mild symptoms  Plan Of Care/Follow-up recommendations:  Activity:  as tolerated Diet:  as tolerated Other:  Follow up with [facility]  Is patient on multiple antipsychotic therapies at discharge:  No   Has Patient had three or more failed trials of antipsychotic monotherapy by history:  No  Recommended Plan for Multiple Antipsychotic Therapies: NA    Keary Hanak, FNP-BC 03/20/2014, 1:48 PM

## 2014-03-21 NOTE — ED Provider Notes (Signed)
Medical screening examination/treatment/procedure(s) were performed by non-physician practitioner and as supervising physician I was immediately available for consultation/collaboration.   EKG Interpretation   Date/Time:  Tuesday March 19 2014 00:20:08 EDT Ventricular Rate:  87 PR Interval:  134 QRS Duration: 98 QT Interval:  418 QTC Calculation: 503 R Axis:   36 Text Interpretation:  Sinus rhythm Probable left atrial enlargement  Nonspecific T abnormalities, lateral leads Borderline ST elevation,  anterior leads Prolonged QT interval No significant change since last  tracing Confirmed by Nikolaus Pienta  MD, Bridney Guadarrama ([id number]) on 03/19/2014 12:23:35 AM       Kalman Drape, MD 03/21/14 1506

## 2014-03-28 ENCOUNTER — Encounter (HOSPITAL_COMMUNITY): Payer: Self-pay | Admitting: Emergency Medicine

## 2014-03-28 ENCOUNTER — Emergency Department (HOSPITAL_COMMUNITY)
Admission: EM | Admit: 2014-03-28 | Discharge: 2014-03-28 | Disposition: A | Discharge status: Home / Self Care | Payer: Self-pay | Attending: Emergency Medicine | Admitting: Emergency Medicine

## 2014-03-28 DIAGNOSIS — F1410 Cocaine abuse, uncomplicated: Secondary | ICD-10-CM | POA: Insufficient documentation

## 2014-03-28 DIAGNOSIS — I10 Essential (primary) hypertension: Secondary | ICD-10-CM | POA: Insufficient documentation

## 2014-03-28 DIAGNOSIS — Z8659 Personal history of other mental and behavioral disorders: Secondary | ICD-10-CM | POA: Insufficient documentation

## 2014-03-28 DIAGNOSIS — R21 Rash and other nonspecific skin eruption: Secondary | ICD-10-CM | POA: Insufficient documentation

## 2014-03-28 DIAGNOSIS — Z7951 Long term (current) use of inhaled steroids: Secondary | ICD-10-CM | POA: Insufficient documentation

## 2014-03-28 DIAGNOSIS — F1210 Cannabis abuse, uncomplicated: Secondary | ICD-10-CM | POA: Insufficient documentation

## 2014-03-28 DIAGNOSIS — Z720 Tobacco use: Secondary | ICD-10-CM | POA: Insufficient documentation

## 2014-03-28 DIAGNOSIS — M109 Gout, unspecified: Secondary | ICD-10-CM | POA: Insufficient documentation

## 2014-03-28 DIAGNOSIS — Z79899 Other long term (current) drug therapy: Secondary | ICD-10-CM | POA: Insufficient documentation

## 2014-03-28 MED ORDER — PREDNISONE 20 MG PO TABS: 60.0000 mg | ORAL_TABLET | Freq: Every day | ORAL | Status: DC

## 2014-03-28 MED ORDER — CEPHALEXIN 500 MG PO CAPS: 500.0000 mg | ORAL_CAPSULE | Freq: Four times a day (QID) | ORAL | Status: DC

## 2014-03-28 MED ORDER — SULFAMETHOXAZOLE-TRIMETHOPRIM 800-160 MG PO TABS: 1.0000 | ORAL_TABLET | Freq: Two times a day (BID) | ORAL | Status: DC

## 2014-03-28 MED ORDER — MUPIROCIN CALCIUM 2 % EX CREA: 1.0000 "application " | TOPICAL_CREAM | Freq: Two times a day (BID) | CUTANEOUS | Status: DC

## 2014-03-28 NOTE — ED Provider Notes (Signed)
CSN: [id number]     Arrival date & time 03/28/14  1308 History  This chart was scribed for a non-physician practitioner, Hyman Bible, PA-C working with Mirna Mires, MD by Martinique Peace, ED Scribe. The patient was seen in TR07C/TR07C. The patient's care was started at 4:15 PM.     Chief Complaint  Patient presents with  . Rash      Patient is a 53 y.o. male presenting with rash. The history is provided by the patient. No language interpreter was used.  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 male who presents to the Emergency Department complaining of rash onset over 1 month ago to bilateral inner forearms with severe pruritis. Pt states that he was seen previously for same problem here in ED and prescribed steroids and antibiotics. He has tattoos to affected area but reports that he had them for years. He has take antibiotics like he was prescribed which was causing improvement in symptoms, but states as soon as he finished up his medication, the rash came right back. Pt admits that he may have had possible poison ivy exposure which may be responsible. He states that he has also been applying Neosporin heavily to affected areas which has exacerbated problem even further. 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 He denies any swelling in lips or face. Pt further denies rash spreading to any other areas.  Denies fever or chills.   Past Medical History  Diagnosis Date  . Gout   . Mental disorder   . Depression   . Hypertension     Past Surgical History  Procedure Laterality Date  . Shoulder surgery    . Ankle surgery     No family history on file. History  Substance Use Topics  . Smoking status: Current Every Day Smoker -- 1.00 packs/day    Types: Cigarettes  . Smokeless tobacco: Never Used  . Alcohol Use: No    Review of Systems  Constitutional: Negative for fever and chills.  Respiratory: Negative for shortness of breath.   Gastrointestinal: Negative for vomiting.  Skin: Positive for color change (redness) and rash.      Allergies  Review of patient's allergies indicates no known allergies.  Home Medications   Prior to Admission medications   Medication Sig Start Date End Date Taking? Authorizing Provider  albuterol (PROVENTIL HFA;VENTOLIN HFA) 108 (90 BASE) MCG/ACT inhaler Inhale 1-2 puffs into the lungs every 6 (six) hours as needed for wheezing or shortness of breath. 05/12/13  Yes Virgel Manifold, MD  amLODipine (NORVASC) 10 MG tablet Take 10 mg by mouth daily.   Yes Historical Provider, MD  carbamazepine (TEGRETOL) 200 MG tablet Take 1 tablet (200 mg total) by mouth 2 (two) times daily after a meal. 03/20/14  Yes Shuvon Rankin, NP  carvedilol (COREG) 12.5 MG tablet Take 12.5 mg by mouth 3 (three) times daily.   Yes Historical Provider, MD  Fluticasone-Salmeterol (ADVAIR) 250-50 MCG/DOSE AEPB Inhale 1 puff into the lungs 2 (two) times daily.   Yes Historical Provider,  MD  hydrALAZINE (APRESOLINE) 25 MG tablet Take 25 mg by mouth every 8 (eight) hours.   Yes Historical Provider, MD  isosorbide mononitrate (IMDUR) 30 MG 24 hr tablet Take 30 mg by mouth daily.   Yes Historical Provider, MD  cephALEXin (KEFLEX) 500 MG capsule Take 1 capsule (500 mg total) by mouth 4 (four) times daily. 03/28/14   Adonai Helzer, PA-C  mupirocin cream (BACTROBAN) 2 % Apply 1 application topically 2 (two) times daily. Apply to the honey colored scaly areas of the rash 03/28/14   Hyman Bible, PA-C  predniSONE  (DELTASONE) 20 MG tablet Take 3 tablets (60 mg total) by mouth daily. 03/28/14   Ebin Palazzi, PA-C   BP 188/110  Pulse 72  Temp(Src) 97.5 F (36.4 C) (Oral)  Resp 22  SpO2 99% Physical Exam  Nursing note and vitals reviewed. Constitutional: He is oriented to person, place, and time. He appears well-developed and well-nourished. No distress.  HENT:  Head: Normocephalic and atraumatic.  No swelling of the lips, tongue, or throat.  Eyes: Conjunctivae and EOM are normal.  Neck: Neck supple. No tracheal deviation present.  Cardiovascular: Normal rate, regular rhythm and normal heart sounds.   Pulmonary/Chest: Effort normal and breath sounds normal. No respiratory distress.  Musculoskeletal: Normal range of motion.       Right shoulder: He exhibits no swelling and no deformity.       Right elbow: He exhibits normal range of motion and no swelling.       Right wrist: He exhibits normal range of motion and no swelling.  Neurological: He is alert and oriented to person, place, and time.  2+ radial pulses bilaterally.   Skin: Skin is warm and dry.  Large erythematous papular scaly area along volar aspect bilaterally. No streaking. Distal sensation of all fingers are intact bilaterally. Honey crusted areas within the erythematous area.  No drainage  Psychiatric: He has a normal mood and affect. His behavior is normal.    ED Course  Procedures (including critical care time) Labs Review Labs Reviewed - No data to display  Results for orders placed during the hospital encounter of 03/19/14  ACETAMINOPHEN LEVEL      Result Value Ref Range   Acetaminophen (Tylenol), Serum <15.0  10 - 30 ug/mL  CBC      Result Value Ref Range   WBC 14.5 (*) 4.0 - 10.5 K/uL   RBC 4.70  4.22 - 5.81 MIL/uL   Hemoglobin 14.7  13.0 - 17.0 g/dL   HCT 42.7  39.0 - 52.0 %   MCV 90.9  78.0 - 100.0 fL   MCH 31.3  26.0 - 34.0 pg   MCHC 34.4  30.0 - 36.0 g/dL   RDW 14.3  11.5 - 15.5 %   Platelets 225  150 -  400 K/uL  COMPREHENSIVE METABOLIC PANEL      Result Value Ref Range   Sodium 136 (*) 137 - 147 mEq/L   Potassium 4.0  3.7 - 5.3 mEq/L   Chloride 98  96 - 112 mEq/L   CO2 24  19 - 32 mEq/L   Glucose, Bld 99  70 - 99 mg/dL   BUN 24 (*) 6 - 23 mg/dL   Creatinine, Ser 1.58 (*) 0.50 - 1.35 mg/dL   Calcium 9.6  8.4 - 10.5 mg/dL   Total Protein 8.0  6.0 - 8.3 g/dL   Albumin 3.9  3.5 - 5.2 g/dL   AST 20  0 - 37  U/L   ALT 17  0 - 53 U/L   Alkaline Phosphatase 67  39 - 117 U/L   Total Bilirubin 0.5  0.3 - 1.2 mg/dL   GFR calc non Af Amer 49 (*) >90 mL/min   GFR calc Af Amer 57 (*) >90 mL/min   Anion gap 14  5 - 15  ETHANOL      Result Value Ref Range   Alcohol, Ethyl (B) <11  0 - 11 mg/dL  SALICYLATE LEVEL      Result Value Ref Range   Salicylate Lvl <5.0 (*) 2.8 - 20.0 mg/dL  URINE RAPID DRUG SCREEN (HOSP PERFORMED)      Result Value Ref Range   Opiates NONE DETECTED  NONE DETECTED   Cocaine POSITIVE (*) NONE DETECTED   Benzodiazepines NONE DETECTED  NONE DETECTED   Amphetamines NONE DETECTED  NONE DETECTED   Tetrahydrocannabinol POSITIVE (*) NONE DETECTED   Barbiturates NONE DETECTED  NONE DETECTED  TROPONIN I      Result Value Ref Range   Troponin I <0.30  <0.30 ng/mL   No results found.    Imaging Review No results found.   EKG Interpretation None     Medications - No data to display  4:22 PM- Treatment plan was discussed with patient who verbalizes understanding and agrees.   MDM   Final diagnoses:  Rash   Patient presenting with rash to the forearms bilaterally.  Honey crusted areas within the erythematous rash, which look like impetigo in appearance.  Full ROM of the joints.  No erythematous streaking.  Patient afebrile.  No oral lesions.  Feel that the patient is stable for discharge.  Return precautions given.    I personally performed the services described in this documentation, which was scribed in my presence. The recorded information has been reviewed  and is accurate.   Hyman Bible, PA-C 03/29/14 [phone]

## 2014-03-28 NOTE — ED Notes (Addendum)
Pt reports rash to lower forearms since Sept 9th, seen here on 9/23 for same. States he got tattoos at the beginning of September but then was exposed to poison ivy. Pt reports continued rash, erythema and itching. Denies fever/chills. Denies streaking. Pt in NAD. States he has been washing site with water, soap and hydrogen peroxide with no relief. Pt noted to be hypertensive, states he hasn't taken medication in several days.

## 2014-03-28 NOTE — Discharge Instructions (Signed)
Stop using the Neosporin.  Take these medications as prescribed.

## 2014-03-31 NOTE — ED Provider Notes (Signed)
Medical screening examination/treatment/procedure(s) were performed by non-physician practitioner and as supervising physician I was immediately available for consultation/collaboration.     Mirna Mires, MD 03/31/14 [phone]

## 2014-04-08 ENCOUNTER — Telehealth: Payer: Self-pay | Admitting: Surgery

## 2014-04-08 NOTE — Telephone Encounter (Signed)
Received call from [facility] Department regarding discharge medication issue. Contacted patient at number in record. Patient is uninsured. Patient stated, he cannot afford quoted price of $90 for Bactroban Cream 2% at [place]. Reviewed medication at Good rx website., Listed at $13.29 text coupon to patient to use at [facility]. Instructed patient on redeeming process, verbalized understanding and appreciation for assistance. Also discussed f/u and establishing care with PCP at the [facility]. Patient agreeable, scheduled appt 10/30 at 11:45. Appt text sent out to  Patient, confirmed receipt of text.

## 2014-04-12 ENCOUNTER — Ambulatory Visit: Payer: Self-pay | Attending: Internal Medicine

## 2014-04-12 VITALS — BP 191/85 | HR 65 | Temp 97.9°F | Resp 18 | Ht 72.0 in | Wt 241.8 lb

## 2014-04-12 DIAGNOSIS — R21 Rash and other nonspecific skin eruption: Secondary | ICD-10-CM

## 2014-04-12 DIAGNOSIS — Z129 Encounter for screening for malignant neoplasm, site unspecified: Secondary | ICD-10-CM

## 2014-04-12 DIAGNOSIS — I10 Essential (primary) hypertension: Secondary | ICD-10-CM

## 2014-04-12 MED ORDER — MUPIROCIN CALCIUM 2 % EX CREA: 1.0000 "application " | TOPICAL_CREAM | Freq: Two times a day (BID) | CUTANEOUS | Status: DC

## 2014-04-12 MED ORDER — AMLODIPINE BESYLATE 10 MG PO TABS: 10.0000 mg | ORAL_TABLET | Freq: Every day | ORAL | Status: DC

## 2014-04-12 MED ORDER — CARVEDILOL 12.5 MG PO TABS: 25.0000 mg | ORAL_TABLET | Freq: Two times a day (BID) | ORAL | Status: DC

## 2014-04-12 MED ORDER — POTASSIUM CHLORIDE CRYS ER 20 MEQ PO TBCR: 20.0000 meq | EXTENDED_RELEASE_TABLET | Freq: Every day | ORAL | Status: DC

## 2014-04-12 MED ORDER — FLUTICASONE-SALMETEROL 250-50 MCG/DOSE IN AEPB: 1.0000 | INHALATION_SPRAY | Freq: Two times a day (BID) | RESPIRATORY_TRACT | Status: DC

## 2014-04-12 MED ORDER — ALBUTEROL SULFATE HFA 108 (90 BASE) MCG/ACT IN AERS: 1.0000 | INHALATION_SPRAY | Freq: Four times a day (QID) | RESPIRATORY_TRACT | Status: DC | PRN

## 2014-04-12 MED ORDER — HYDROCHLOROTHIAZIDE 12.5 MG PO TABS: 12.5000 mg | ORAL_TABLET | Freq: Every day | ORAL | Status: DC

## 2014-04-12 MED ORDER — CLONIDINE HCL 0.1 MG PO TABS
0.1000 mg | ORAL_TABLET | Freq: Once | ORAL | Status: AC
  Administered 2014-04-12: 0.1 mg via ORAL

## 2014-04-12 NOTE — Progress Notes (Unsigned)
Patient ID: Chris [name], male   DOB: [birth year], 53 y.o.   MRN: [id number]  Patient Demographics  Chris [name], is a 53 y.o. male  WCH:[id number]  PNT:[id number]  DOB - 11-20-1960  Chief Complaint  Patient presents with  . Rash        Subjective:  Chris [name] today is PRESENTING WITH Rash  on 04/12/2014  HPI: Patient is 53 y.o. male with past medical history of hypertension, depression presenting to the clinic today for evaluation of the above noted complaint. Patient was seen in the emergency room on 10/15 for bilateral forearm/elbow erythema and prescribed Bactroban ointment, however patient was unable to afford this medication. He recently moved to [place], and presents to the clinic here for establishment of primary care. Per patient, he thinks he started having this rash after he was exposed to poison ivy approximately 4 weeks back. He thinks that this rash is slightly getting better.  Patient claims to have a history of CHF, but is unable to provide further answer. He is not currently complaining of any exertional dyspnea or lower extremity edema. He denies having MI in the past.  He claims he is on amlodipine, Coreg and was also supposed to take by Bidil- however he is not taking Bidil goes it gives him headaches. He claims he is compliant to Coreg and amlodipine however.  Patient has also has No headache, No chest pain, No abdominal pain,No Nausea, No new weakness tingling or numbness, No Cough or SOB.      Objective:     Filed Vitals:   04/12/14 1204  BP: 191/85  Pulse: 65  Temp: 97.9 F (36.6 C)  TempSrc: Oral  Resp: 18  Height: 6' (1.829 m)  Weight: 241 lb 12.8 oz (109.68 kg)  SpO2: 97%     ALLERGIES:  No Known Allergies  PAST MEDICAL HISTORY: Past Medical History  Diagnosis Date  . Gout   . Mental disorder   . Depression   . Hypertension     PAST SURGICAL HISTORY: Past Surgical History  Procedure Laterality Date  . Shoulder surgery     . Ankle surgery      FAMILY HISTORY: History reviewed. No pertinent family history.  MEDICATIONS: Current Outpatient Prescriptions on File Prior to Visit  Medication Sig  . albuterol (PROVENTIL HFA;VENTOLIN HFA) 108 (90 BASE) MCG/ACT inhaler Inhale 1-2 puffs into the lungs every 6 (six) hours as needed for wheezing or shortness of breath.  [name] amLODipine (NORVASC) 10 MG tablet Take 10 mg by mouth daily.  . carbamazepine (TEGRETOL) 200 MG tablet Take 1 tablet (200 mg total) by mouth 2 (two) times daily after a meal.  . Fluticasone-Salmeterol (ADVAIR) 250-50 MCG/DOSE AEPB Inhale 1 puff into the lungs 2 (two) times daily.   No current facility-administered medications on file prior to visit.    SOCIAL HISTORY:   reports that he has been smoking Cigarettes.  He has been smoking about 1.00 pack per day. He has never used smokeless tobacco. He reports that he uses illicit drugs (Cocaine and Marijuana) about 21 times per week. He reports that he does not drink alcohol.  REVIEW OF SYSTEMS:  Constitutional:   No   Fevers, chills, fatigue.  HEENT:    No headaches, Sore throat,   Cardio-vascular: No chest pain,  Orthopnea, swelling in lower extremities, anasarca, palpitations  GI:  No abdominal pain, nausea, vomiting, diarrhea  Resp: No shortness of breath,  No coughing up of blood.No cough.No wheezing.  GU:  no dysuria, change in color of urine, no urgency or frequency.  No flank pain.  Musculoskeletal: No joint pain or swelling.  No decreased range of motion.  No back pain.  Psych: No change in mood or affect. No depression or anxiety.  No memory loss.   Exam  General appearance :Awake, alert, not in any distress. Speech Clear. Not toxic Looking HEENT: Atraumatic and Normocephalic, pupils equally reactive to light and accomodation Neck: supple, no JVD. No cervical lymphadenopathy.  Chest:Good air entry bilaterally, no added sounds  CVS: S1 S2 regular, no murmurs.  Abdomen:  Bowel sounds present, Non tender and not distended with no gaurding, rigidity or rebound. Extremities: B/L Lower Ext shows no edema, both legs are warm to touch Neurology: Awake alert, and oriented X 3, CN II-XII intact, Non focal Skin: Dry erythematous rash in bilateral flexor aspect of the forearms and elbow. Some scratch proximal. But no open wounds. Wounds:N/A    Data Review   CBC No results found for this basename: WBC, HGB, HCT, PLT, MCV, MCH, MCHC, RDW, NEUTRABS, LYMPHSABS, MONOABS, EOSABS, BASOSABS, BANDABS, BANDSABD,  in the last 168 hours  Chemistries   No results found for this basename: NA, K, CL, CO2, GLUCOSE, BUN, CREATININE, GFRCGP, CALCIUM, MG, AST, ALT, ALKPHOS, BILITOT,  in the last 168 hours ------------------------------------------------------------------------------------------------------------------ No results found for this basename: HGBA1C,  in the last 72 hours ------------------------------------------------------------------------------------------------------------------ No results found for this basename: CHOL, HDL, LDLCALC, TRIG, CHOLHDL, LDLDIRECT,  in the last 72 hours ------------------------------------------------------------------------------------------------------------------ No results found for this basename: TSH, T4TOTAL, FREET3, T3FREE, THYROIDAB,  in the last 72 hours ------------------------------------------------------------------------------------------------------------------ No results found for this basename: VITAMINB12, FOLATE, FERRITIN, TIBC, IRON, RETICCTPCT,  in the last 72 hours  Coagulation profile  No results found for this basename: INR, PROTIME,  in the last 168 hours     Assessment & Plan  1. Essential hypertension - Uncontrolled-noncompliant to all of his medications. Does not take Bidil because of headache.Claims to be compliant to Amlodipine and Coreg. Will give clonidine x1 here in the office. Will increase Coreg to 25  mg twice a day, continue amlodipine 10 mg daily, add 12.5 mg HCTZ daily and 20 mEq of KCL. Followup in the clinic in one week for BP check. Patient has been counseled regarding importance of compliance and followup.  2. Rash and nonspecific skin eruption - Not sure what the exact etiology is. Patient wants to try Bactroban cream-will prescribe. If no response in one week, will likely need low- potency  topical steroids.  3. Reported history of CHF: Not sure whether this is systolic or diastolic, not even sure whether this is actually the case. Will get echocardiogram.  Health Maintenance -Colonoscopy:refer to GI -Vaccinations:refused Flu vaccine  Follow up in 1 week  The patient was given clear instructions to go to ER or return to medical center if symptoms don't improve, worsen or new problems develop. The patient verbalized understanding. The patient was told to call to get lab results if they haven't heard anything in the next week.

## 2014-04-12 NOTE — Progress Notes (Unsigned)
Pt presents to [facility], states he is not able to fill prescription, no money.

## 2014-04-15 ENCOUNTER — Ambulatory Visit (HOSPITAL_COMMUNITY): Payer: Self-pay | Attending: Internal Medicine

## 2014-04-23 ENCOUNTER — Ambulatory Visit: Payer: Self-pay | Attending: Internal Medicine | Admitting: Internal Medicine

## 2014-04-23 ENCOUNTER — Encounter: Payer: Self-pay | Admitting: Internal Medicine

## 2014-04-23 VITALS — BP 132/80 | HR 66 | Temp 97.6°F | Resp 15 | Ht 76.0 in | Wt 230.0 lb

## 2014-04-23 DIAGNOSIS — I10 Essential (primary) hypertension: Secondary | ICD-10-CM

## 2014-04-23 DIAGNOSIS — R21 Rash and other nonspecific skin eruption: Secondary | ICD-10-CM

## 2014-04-23 DIAGNOSIS — L299 Pruritus, unspecified: Secondary | ICD-10-CM

## 2014-04-23 DIAGNOSIS — Z720 Tobacco use: Secondary | ICD-10-CM

## 2014-04-23 DIAGNOSIS — F17200 Nicotine dependence, unspecified, uncomplicated: Secondary | ICD-10-CM

## 2014-04-23 MED ORDER — HYDROCORTISONE 2.5 % EX CREA: TOPICAL_CREAM | Freq: Two times a day (BID) | CUTANEOUS | Status: DC

## 2014-04-23 MED ORDER — CETIRIZINE HCL 10 MG PO TABS: 10.0000 mg | ORAL_TABLET | Freq: Every day | ORAL | Status: DC

## 2014-04-23 MED ORDER — SULFAMETHOXAZOLE-TRIMETHOPRIM 400-80 MG PO TABS: 1.0000 | ORAL_TABLET | Freq: Two times a day (BID) | ORAL | Status: DC

## 2014-04-23 NOTE — Progress Notes (Signed)
Patient is here for a follow up visit for blood pressure check.  He states he is taking his bp medication.   Patient c/o right forearm itching and irritated due to tattoo.

## 2014-04-23 NOTE — Progress Notes (Signed)
MRN: [id number] Name: Chris [name]  Sex: male Age: 53 y.o. DOB: [birth year]  Allergies: Review of patient's allergies indicates no known allergies.  Chief Complaint  Patient presents with  . Follow-up    HPI: Patient is 53 y.o. male who has history of hypertension depression, and is ago he was seen in transitional care clinic, EMR reviewed her patient was taking amlodipine and Coreg, the last visit his Coreg dosage was increased as well as hydrochlorothiazide was added and patient was advised to follow up in one week, he also had a rash he was given prescription for Bactroban. Questionable history of CHF, unless visit echocardiogram was ordered patient has not done yet. Patient also has history of polysubstance abuse, last urine tox was positive for cocaine and THC. Today's blood pressure is improved, still complaining of back rash has been applying Bactroban cream does complain of itching, patient is to smoke cigarettes, I have advised patient quit smoking.  Past Medical History  Diagnosis Date  . Gout   . Mental disorder   . Depression   . Hypertension     Past Surgical History  Procedure Laterality Date  . Shoulder surgery    . Ankle surgery        Medication List       This list is accurate as of: 04/23/14 12:03 PM.  Always use your most recent med list.               albuterol 108 (90 BASE) MCG/ACT inhaler  Commonly known as:  PROVENTIL HFA;VENTOLIN HFA  Inhale 1-2 puffs into the lungs every 6 (six) hours as needed for wheezing or shortness of breath.     amLODipine 10 MG tablet  Commonly known as:  NORVASC  Take 1 tablet (10 mg total) by mouth daily.     carbamazepine 200 MG tablet  Commonly known as:  TEGRETOL  Take 1 tablet (200 mg total) by mouth 2 (two) times daily after a meal.     carvedilol 12.5 MG tablet  Commonly known as:  COREG  Take 2 tablets (25 mg total) by mouth 2 (two) times daily with a meal.     cetirizine 10 MG tablet  Commonly  known as:  ZYRTEC  Take 1 tablet (10 mg total) by mouth daily.     Fluticasone-Salmeterol 250-50 MCG/DOSE Aepb  Commonly known as:  ADVAIR  Inhale 1 puff into the lungs 2 (two) times daily.     hydrochlorothiazide 12.5 MG tablet  Commonly known as:  HYDRODIURIL  Take 1 tablet (12.5 mg total) by mouth daily.     hydrocortisone 2.5 % cream  Apply topically 2 (two) times daily.     mupirocin cream 2 %  Commonly known as:  BACTROBAN  Apply 1 application topically 2 (two) times daily. Apply to the honey colored scaly areas of the rash     potassium chloride SA 20 MEQ tablet  Commonly known as:  K-DUR,KLOR-CON  Take 1 tablet (20 mEq total) by mouth daily.     sulfamethoxazole-trimethoprim 400-80 MG per tablet  Commonly known as:  BACTRIM  Take 1 tablet by mouth 2 (two) times daily.        Meds ordered this encounter  Medications  . sulfamethoxazole-trimethoprim (BACTRIM) 400-80 MG per tablet    Sig: Take 1 tablet by mouth 2 (two) times daily.    Dispense:  20 tablet    Refill:  0  . cetirizine (ZYRTEC) 10 MG tablet  Sig: Take 1 tablet (10 mg total) by mouth daily.    Dispense:  30 tablet    Refill:  11  . hydrocortisone 2.5 % cream    Sig: Apply topically 2 (two) times daily.    Dispense:  30 g    Refill:  0     There is no immunization history on file for this patient.  History reviewed. No pertinent family history.  History  Substance Use Topics  . Smoking status: Current Every Day Smoker -- 1.00 packs/day    Types: Cigarettes  . Smokeless tobacco: Never Used  . Alcohol Use: No    Review of Systems   As noted in HPI  Filed Vitals:   04/23/14 1137  BP: 132/80  Pulse: 66  Temp: 97.6 F (36.4 C)  Resp: 15    Physical Exam  Physical Exam  Constitutional: No distress.  HENT:  Head: Normocephalic and atraumatic.  Eyes: EOM are normal. Pupils are equal, round, and reactive to light.  Cardiovascular: Normal rate and regular rhythm.     Pulmonary/Chest: Breath sounds normal. No respiratory distress. He has no wheezes. He has no rales.  Skin:  raised Rash on right fore arm at the tattoo site     CBC    Component Value Date/Time   WBC 14.5* 03/19/2014 0029   RBC 4.70 03/19/2014 0029   HGB 14.7 03/19/2014 0029   HCT 42.7 03/19/2014 0029   PLT 225 03/19/2014 0029   MCV 90.9 03/19/2014 0029   LYMPHSABS 2.0 05/12/2013 0804   MONOABS 0.8 05/12/2013 0804   EOSABS 0.1 05/12/2013 0804   BASOSABS 0.0 05/12/2013 0804    CMP     Component Value Date/Time   NA 136* 03/19/2014 0029   K 4.0 03/19/2014 0029   CL 98 03/19/2014 0029   CO2 24 03/19/2014 0029   GLUCOSE 99 03/19/2014 0029   BUN 24* 03/19/2014 0029   CREATININE 1.58* 03/19/2014 0029   CALCIUM 9.6 03/19/2014 0029   PROT 8.0 03/19/2014 0029   ALBUMIN 3.9 03/19/2014 0029   AST 20 03/19/2014 0029   ALT 17 03/19/2014 0029   ALKPHOS 67 03/19/2014 0029   BILITOT 0.5 03/19/2014 0029   GFRNONAA 49* 03/19/2014 0029   GFRAA 57* 03/19/2014 0029    No results found for: CHOL  No components found for: HGA1C  Lab Results  Component Value Date/Time   AST 20 03/19/2014 12:29 AM    Assessment and Plan  Essential hypertension Blood pressure is improved he'll continue with amlodipine Coreg  and hydrochlorothiazide also advise patient for DASH diet.  Smoking Advised patient to quit smoking.  Rash and nonspecific skin eruption - Plan: sulfamethoxazole-trimethoprim (BACTRIM) 400-80 MG per tablet, hydrocortisone 2.5 % cream  If the rash does not improve consider referral to dermatology.  Itching - Plan: cetirizine (ZYRTEC) 10 MG tablet   Health Maintenance Patient declines flu shot   Return in about 3 months (around 07/24/2014) for hypertension.  Lorayne Marek, MD

## 2014-04-23 NOTE — Patient Instructions (Signed)
DASH Eating Plan °DASH stands for "Dietary Approaches to Stop Hypertension." The DASH eating plan is a healthy eating plan that has been shown to reduce high blood pressure (hypertension). Additional health benefits may include reducing the risk of type 2 diabetes mellitus, heart disease, and stroke. The DASH eating plan may also help with weight loss. °WHAT DO I NEED TO KNOW ABOUT THE DASH EATING PLAN? °For the DASH eating plan, you will follow these general guidelines: °· Choose foods with a percent daily value for sodium of less than 5% (as listed on the food label). °· Use salt-free seasonings or herbs instead of table salt or sea salt. °· Check with your health care provider or pharmacist before using salt substitutes. °· Eat lower-sodium products, often labeled as "lower sodium" or "no salt added." °· Eat fresh foods. °· Eat more vegetables, fruits, and low-fat dairy products. °· Choose whole grains. Look for the word "whole" as the first word in the ingredient list. °· Choose fish and skinless chicken or turkey more often than red meat. Limit fish, poultry, and meat to 6 oz (170 g) each day. °· Limit sweets, desserts, sugars, and sugary drinks. °· Choose heart-healthy fats. °· Limit cheese to 1 oz (28 g) per day. °· Eat more home-cooked food and less restaurant, buffet, and fast food. °· Limit fried foods. °· Cook foods using methods other than frying. °· Limit canned vegetables. If you do use them, rinse them well to decrease the sodium. °· When eating at a restaurant, ask that your food be prepared with less salt, or no salt if possible. °WHAT FOODS CAN I EAT? °Seek help from a dietitian for individual calorie needs. °Grains °Whole grain or whole wheat bread. Brown rice. Whole grain or whole wheat pasta. Quinoa, bulgur, and whole grain cereals. Low-sodium cereals. Corn or whole wheat flour tortillas. Whole grain cornbread. Whole grain crackers. Low-sodium crackers. °Vegetables °Fresh or frozen vegetables  (raw, steamed, roasted, or grilled). Low-sodium or reduced-sodium tomato and vegetable juices. Low-sodium or reduced-sodium tomato sauce and paste. Low-sodium or reduced-sodium canned vegetables.  °Fruits °All fresh, canned (in natural juice), or frozen fruits. °Meat and Other Protein Products °Ground beef (85% or leaner), grass-fed beef, or beef trimmed of fat. Skinless chicken or turkey. Ground chicken or turkey. Pork trimmed of fat. All fish and seafood. Eggs. Dried beans, peas, or lentils. Unsalted nuts and seeds. Unsalted canned beans. °Dairy °Low-fat dairy products, such as skim or 1% milk, 2% or reduced-fat cheeses, low-fat ricotta or cottage cheese, or plain low-fat yogurt. Low-sodium or reduced-sodium cheeses. °Fats and Oils °Tub margarines without trans fats. Light or reduced-fat mayonnaise and salad dressings (reduced sodium). Avocado. Safflower, olive, or canola oils. Natural peanut or almond butter. °Other °Unsalted popcorn and pretzels. °The items listed above may not be a complete list of recommended foods or beverages. Contact your dietitian for more options. °WHAT FOODS ARE NOT RECOMMENDED? °Grains °White bread. White pasta. White rice. Refined cornbread. Bagels and croissants. Crackers that contain trans fat. °Vegetables °Creamed or fried vegetables. Vegetables in a cheese sauce. Regular canned vegetables. Regular canned tomato sauce and paste. Regular tomato and vegetable juices. °Fruits °Dried fruits. Canned fruit in light or heavy syrup. Fruit juice. °Meat and Other Protein Products °Fatty cuts of meat. Ribs, chicken wings, bacon, sausage, bologna, salami, chitterlings, fatback, hot dogs, bratwurst, and packaged luncheon meats. Salted nuts and seeds. Canned beans with salt. °Dairy °Whole or 2% milk, cream, half-and-half, and cream cheese. Whole-fat or sweetened yogurt. Full-fat   cheeses or blue cheese. Nondairy creamers and whipped toppings. Processed cheese, cheese spreads, or cheese  curds. °Condiments °Onion and garlic salt, seasoned salt, table salt, and sea salt. Canned and packaged gravies. Worcestershire sauce. Tartar sauce. Barbecue sauce. Teriyaki sauce. Soy sauce, including reduced sodium. Steak sauce. Fish sauce. Oyster sauce. Cocktail sauce. Horseradish. Ketchup and mustard. Meat flavorings and tenderizers. Bouillon cubes. Hot sauce. Tabasco sauce. Marinades. Taco seasonings. Relishes. °Fats and Oils °Butter, stick margarine, lard, shortening, ghee, and bacon fat. Coconut, palm kernel, or palm oils. Regular salad dressings. °Other °Pickles and olives. Salted popcorn and pretzels. °The items listed above may not be a complete list of foods and beverages to avoid. Contact your dietitian for more information. °WHERE CAN I FIND MORE INFORMATION? °[employer], [employer], [employer]: www.nhlbi.nih.gov/health/health-topics/topics/dash/ °Document Released: 05/20/2011 Document Revised: 10/15/2013 Document Reviewed: 04/04/2013 °Exit[employer]® Patient Information ©2015 [employer]. This information is not intended to replace advice given to you by your health care provider. Make sure you discuss any questions you have with your health care provider. ° °

## 2014-05-13 ENCOUNTER — Telehealth: Payer: Self-pay | Admitting: Internal Medicine

## 2014-05-13 ENCOUNTER — Telehealth: Payer: Self-pay | Admitting: General Practice

## 2014-05-13 NOTE — Telephone Encounter (Signed)
Monique from the medical records department is calling because she has faxed a request for his medical records to [place] on the 21st of nov and is calling to check on the status. Please follow up with Monique.

## 2014-10-05 NOTE — H&P (Signed)
PATIENT NAME:  Chris [name], Chris [name] MR#:  [id number] DATE OF BIRTH:  Mar 02, 1961  DATE OF ADMISSION:  10/20/2013  PRIMARY CARE PROVIDER:  None.   EMERGENCY DEPARTMENT REFERRING PHYSICIAN:  Dr. Mayford Knife.   CHIEF COMPLAINT:  Shortness of breath, cough, left-sided chest pain.   HISTORY OF PRESENT ILLNESS:  The patient is a 54 year old African American male with a history of hypertension, he has not been taking his medications, who presents with months of dyspnea on exertion, intermittent wheezing, coughing and left-sided chest pain. Also swelling of the lower extremity. He reports that the shortness of breath was initially with activity and now it is also at rest. He cannot lie flat at nighttime with him getting short of breath. He also is getting lower extremity swelling. He started having left-sided chest pain, sharp in nature. He reports that he has to take about 8 ibuprofens to relieve his pain. He relates the pain sometimes to be positional, but other times it is with exertion and at rest. He also has been having wheezing and has had yellow productive cough without any fevers. He has not had any nausea, vomiting or diarrhea. Denies any abdominal pain. Denies any fevers.   PAST MEDICAL HISTORY:  Hypertension for multiple years, not taking medications.   PAST SURGICAL HISTORY: 1.  Inguinal hernia repair x 2.  2.  Left ankle surgery.  3.  Right shoulder surgery.   ALLERGIES:  None.   MEDICATIONS:  None.   SOCIAL HISTORY:  Smokes about 1 pack per day for multiple years. No alcohol but reports that he uses cocaine from time to time.   FAMILY HISTORY:  Father's side had heart disease. He is not sure what type.   REVIEW OF SYSTEMS:  CONSTITUTIONAL:  Denies any fevers. Complains of fatigue, weakness, left-sided chest pain. No weight loss or weight gain.  EYES:  No blurred or double vision. No pain. No redness. No inflammation. No glaucoma. No cataracts.  ENT:  No tinnitus. No ear pain. No hearing  loss. No seasonal or year-round allergies. No epistaxis. No nasal discharge. No snoring. No difficulty swallowing.  RESPIRATORY:  Complains of cough, wheezing. Has no hemoptysis. No history of COPD, Pneumonia.  CARDIOVASCULAR:  Complains of chest pain, orthopnea, edema, dyspnea on exertion. No palpitations.  GASTROINTESTINAL:  No nausea, vomiting, diarrhea. No abdominal pain. No hematemesis. No melena. No ulcer.  GENITOURINARY:  Denies any dysuria, hematuria, renal calculus or frequency.  ENDOCRINE:  Denies any polyuria, nocturia or thyroid problems.  HEMATOLOGIC AND LYMPHATIC:  Denies anemia, easy bruisability or bleeding.  SKIN:  No acne. No rash. No changes in mole, hair or skin.  MUSCULOSKELETAL:  Denies any pain in the neck, back or shoulder.  NEUROLOGIC:  No numbness, CVA, TIA.   PSYCHIATRIC: No anxiety, insomnia or ADD.   PHYSICAL EXAMINATION:  VITAL SIGNS:  Temperature 97.9, pulse 82, respirations 24, blood pressure 175/119.  GENERAL:  A well-developed male in no acute distress.  HEENT:  Atraumatic, normocephalic. Pupils equally round, reactive to light and accommodation. No conjunctival pallor. No scleral icterus. Nasal exam shows no drainage or ulceration.  OROPHARYNX:  Clear without any exudate.  NECK:  Supple without any JVD.  CARDIOVASCULAR:  Regular rate and rhythm. No murmurs, rubs, clicks or gallops.  LUNGS:  Bilateral crackles and some wheezing. There are no rhonchi. No accessory muscle usage.  ABDOMEN:  Soft, nontender, nondistended. Positive bowel sounds x 4. No hepatosplenomegaly.  EXTREMITIES:  Have 1+ edema.  SKIN:  No rash.  LYMPHATICS:  No lymph nodes palpable.  VASCULAR:  Good DP, PT pulses.  PSYCHIATRIC:  Not anxious or depressed.  NEUROLOGIC: Awake, alert, oriented x 3. No focal deficits.   LABORATORY, DIAGNOSTIC, AND RADIOLOGICAL DATA:  BNP 6832, glucose 84, BUN 20, creatinine 1.47, sodium 140, potassium 4.6, chloride 109, CO2 is 26, calcium is 8.8. LFTs:   Total protein of 6.7, albumin 3.1, bili total 0.6, alk phos 90. AST 34, ALT 69. CPK 179, CK-MB 2.1. Troponins 0.07. WBC 10.2, hemoglobin 14.5, platelet count 201. Chest x-ray findings consistent with cardiac enlargement with vascular congestion. No evidence of pneumonia.   ASSESSMENT AND PLAN:  The patient is a 54 year old African American male with history of poorly controlled blood pressure, nicotine addiction, presents with shortness of breath, chest pain.  1.  Shortness of breath, likely due to combination of acute congestive heart failure, as well as likely acute chronic obstructive pulmonary disease flare. Both of these would be a new diagnosis for this patient. At this time, we will treat him with IV Lasix. Place him on nebulizers, steroids and inhalers and antibiotics with his productive cough. He will need an echocardiogram of the heart.  2.  Acute congestive heart failure, type unknown. We will get an echocardiogram, IV Lasix, nitroglycerin patch. Monitor his renal functions since it is abnormal.  3.  Accelerated hypertension. The patient will be started him on metoprolol, a nitroglycerin patch will be added, and he will be on IV Lasix.  4.  Chest pain. We will do serial enzymes and place him on aspirin. Echocardiogram, Cardiology evaluation. The patient will need some sort of ischemic workup.  5.  Nicotine addiction. The patient was counseled regarding smoking cessation, 4 minutes spent. He will be started on a nicotine patch.   TIME SPENT:  50 minutes.   ____________________________ Lacie Scotts Allena Katz, MD shp:jm D: 10/20/2013 12:47:53 ET T: 10/20/2013 15:45:13 ET JOB#: [id number]  cc: Armani Brar H. Allena Katz, MD, <Dictator> Charise Carwin MD ELECTRONICALLY SIGNED 10/23/2013 14:44

## 2014-10-05 NOTE — Discharge Summary (Signed)
PATIENT NAME:  Chris [name], Chris [name] MR#:  [id number] DATE OF BIRTH:  [birth year]  DATE OF ADMISSION:  10/20/2013 DATE OF DISCHARGE:  10/23/2013  CONSULTANTS: Dr. Nehemiah Massed from cardiology, Dr. Juleen China from nephrology.   CHIEF COMPLAINT: Shortness of breath, cough, left-sided chest pain.   DISCHARGE DIAGNOSES:  1. Acute respiratory failure, resolved, likely due to acute systolic congestive heart failure with likely acute chronic obstructive pulmonary disease exacerbation, both new diagnoses for the patient.  2. Acute systolic congestive heart failure with nonischemic cardiomyopathy dilated nature with ejection fraction of 20% to 25%.  3. Accelerated malignant hypertension in the setting of medication noncompliance and cocaine abuse.  4. Positive troponin, likely demand ischemia. Renal failure, likely acute on chronic with suspected hypertensive nephropathy.   DISCHARGE MEDICATIONS:  1. Isosorbide mononitrate 30 mg once a day.  2. Carvedilol 12.5 mg 3 times a day.  3. Hydralazine 25 mg every 8 hours.  4. Amlodipine 10 mg daily.  5. Prednisone 50 mg day one, then taper by 10 mg until done in 5 days.  6. Advair 250/50 mcg 1 puff 2 times a day.   DIET: Low sodium, low fat, low cholesterol.   ACTIVITY: As tolerated.   FOLLOWUP: Please follow with cardiology and nephrology within 1 to 2 weeks. Check a kidney function test with a nephrologist within a week. Please go to [facility] within 1 to 2 weeks.   DISPOSITION: Home.   SIGNIFICANT LABS AND IMAGING: Initial BUN 20, creatinine 1.47, peak creatinine 1.94, last creatinine 1.76. Last BUN of 39. Initial troponin 0.07, then 0.07, then 0.04. TSH was 0.32. U-tox positive for cocaine. Initial white count of 10.2, hemoglobin of 14.5. Urine eosinophils negative. Serum free T3 within normal limits. Hepatitis panel nonreactive.   Echocardiogram showing EF of 20%-25% with dilated cardiomyopathy with severely increased LV posterior wall thickness. Cardiac  catheter done on May 11th showed no significant CAD. Ultrasound of the kidneys bilaterally showing medical renal disease. An x-ray of the chest, PA and lateral, showed cardiac enlargement with vascular congestion. No evidence of pneumonia.  HISTORY OF PRESENT ILLNESS AND HOSPITAL COURSE: For full details of H and P, please see the dictation on day of admission by Dr. Posey Pronto but briefly, this is a pleasant 54 year old with history of hypertension, who has not been taking medications for many years, who came in for chronic shortness of breath, dyspnea on exertion, intermittent wheezing, and left-sided chest pain. He was also noted to have mildly elevated troponin with significantly elevated blood pressures and diastolic close to 528 range. He was noted to be in acute CHF and also a likely COPD as he is a long-time smoker. He was admitted to the hospitalist service with cycling of the troponin and cardiology was consulted.   In regards to the shortness of breath, I suspect that patient likely had acute systolic CHF with acute COPD flare. Echocardiogram showed EF of 20%-25% and although he was given some Lasix, he did bump his BUN and creatinine and therefore the Lasix was stopped and at this point, he is not in a volume overload state. His medications were optimized. At this point, he has been started on Coreg. However, given the kidney disease, we could not do ACE inhibitor and at this time we have done hydralazine and Imdur. He was strongly counseled and encouraged not to use cocaine any longer as well as smoking. He underwent a cardiac catheter showing no significant CAD. 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 Once his kidney  function is stable, he needs an ACE or an ARB and he would be following with cardiology as an outpatient for further evaluation. In regards to the likely COPD, he was started on steroids and he will be discharged on a prednisone taper. He was also started on Advair and an  antibiotic. At this point, his wheezing is significantly better and he is not significantly hypoxic. He has accelerated hypertension, which is likely due to noncompliance with medications and cocaine habits is improved. We have optimized his medication. He has no active chest pain or shortness of breath. We suspect he also has chronic renal failure, likely hypertensive nephropathy. Ultrasound of the kidneys shows renal disease and he will follow with nephrology as an outpatient.   PHYSICAL EXAMINATION:  VITAL SIGNS: On the day of discharge, temperature is 97.6, pulse rate 80, respiratory rate 18, blood pressure 152/97, oxygen saturation 99% on room air.  GENERAL: The patient is well is a well-developed,  male no obvious distress.  HEENT: Normocephalic, atraumatic.  CARDIOLOGIC: Normal S1, S2. No significant murmurs.  LUNGS: Clear without significant wheezing.  ABDOMEN: Soft, nontender.  EXTREMITIES: No pitting edema.   TOTAL TIME SPENT: About 35 minutes.   CODE STATUS: The patient is full code.    ____________________________ Vivien Presto, MD sa:lt D: 10/23/2013 18:26:34 ET T: 10/23/2013 19:23:25 ET JOB#: [id number]  cc: Vivien Presto, MD, <Dictator> Mamie Levers, MD Corey Skains, MD  Vivien Presto MD ELECTRONICALLY SIGNED 11/06/2013 18:34

## 2014-10-05 NOTE — Consult Note (Signed)
PATIENT NAME:  Chris [name], Chris [name] MR#:  [id number] DATE OF BIRTH:  10/25/1960  DATE OF CONSULTATION:  10/20/2013  CONSULTING PHYSICIAN:  Corey Skains, MD  CONSULTING PHYSICIAN: Dr.   Luiz Iron FOR CONSULTATION: Unstable angina, hypertension, and heart failure with elevated troponin.   CHIEF COMPLAINT: "I got short of breath."   HISTORY OF PRESENT ILLNESS: This is a 54 year old male with no evidence of previous cardiovascular history, who has had new onset of substernal chest discomfort, shortness of breath, chest pain radiating into his back and left arm over the last 3 or 4 days, increasing with lower extremity edema, weakness, fatigue, and overall concern with increased lower extremity edema. The patient has had culmination and cough and some mild sputum production. The patient has had an EKG today showing normal sinus rhythm with preventricular contractions, left ventricular hypertrophy, and anterior T wave inversions with a troponin elevation of 0.7. The patient has had no current evidence of need for hypertension control.   REVIEW OF SYSTEMS: The remainder review of systems negative for vision change, ringing in the ears, hearing loss, cough, congestion, heartburn, nausea, vomiting, diarrhea, bloody stool, stomach pain, extremity pain, leg weakness, cramping of the buttocks, known blood clots, headaches, blackouts, dizzy spells, nosebleeds, frequent urination, urination at night, muscle weakness, numbness, anxiety, depression, skin lesions, or skin rashes.   PAST MEDICAL HISTORY: Borderline hypertension.   FAMILY HISTORY: Father had early onset of cardiovascular disease. Mother had hypertension.   SOCIAL HISTORY: Currently denies alcohol or tobacco use.   ALLERGIES: As listed.   MEDICATIONS: As listed.   PHYSICAL EXAMINATION:  VITAL SIGNS: Blood pressure is 146/68, bilaterally heart rate 72 upright, reclining, and regular. GENERAL: He is a well appearing male in no acute distress.   HEENT: No icterus, thyromegaly, ulcers, hemorrhage, or xanthelasma.  CARDIOVASCULAR: Regular rate and rhythm. Normal S1 and S2. A 2/6 apical  murmur consistent with mitral regurgitation. PMI is diffuse. Carotid upstroke normal without bruit. Jugular venous pressure is normal.  LUNGS: Decreased breath sounds and rales throughout and a few wheezes.  ABDOMEN: Soft, nontender, without hepatosplenomegaly or masses. Abdominal aorta is normal size and without bruit.  EXTREMITIES: Shows 2+ radial and trace femoral, no dorsal pedal pulses, with 1+ lower extremity edema. No cyanosis, clubbing or ulcers.  NEUROLOGIC: He is oriented to time, place, and person, with normal mood and affect.   ASSESSMENT: A 54 year old male with borderline hypertension and new onset acute systolic dysfunction, congestive heart failure, and unstable angina with elevated troponin.   RECOMMENDATIONS:  1. Continue serial ECG and enzymes to assess for possible myocardial infarction. 2. Further evaluation of unstable angina to evaluate the possibility with echocardiogram for LV dysfunction and congestive heart failure.  3. Lasix for lower extremity edema and heart failure-type symptoms.  4. Consideration of cardiac catheterization depending above for further evaluation and treatment options. The patient understands the risks and benefits of cardiac catheterization. This includes the possibility of death, stroke, heart attack, infection, bleeding, or blood clot. He is at low risk for conscious sedation. 5. Ambulation with additional medication management including the possibility of beta blocker and further adjustments thereafter.    ____________________________ Corey Skains, MD bjk:lt D: 10/21/2013 05:54:40 ET T: 10/21/2013 07:10:07 ET JOB#: [id number]  cc: Corey Skains, MD, <Dictator> Corey Skains MD ELECTRONICALLY SIGNED 10/21/2013 8:02

## 2014-10-25 DIAGNOSIS — I5022 Chronic systolic (congestive) heart failure: Secondary | ICD-10-CM | POA: Insufficient documentation

## 2017-04-18 ENCOUNTER — Ambulatory Visit: Payer: Self-pay | Admitting: Family Medicine

## 2017-04-22 ENCOUNTER — Ambulatory Visit: Payer: Self-pay | Admitting: Family Medicine

## 2017-05-13 ENCOUNTER — Ambulatory Visit: Payer: Self-pay | Attending: Family Medicine | Admitting: Licensed Clinical Social Worker

## 2017-05-13 ENCOUNTER — Encounter: Payer: Self-pay | Admitting: Family Medicine

## 2017-05-13 ENCOUNTER — Ambulatory Visit: Payer: Self-pay | Attending: Family Medicine | Admitting: Family Medicine

## 2017-05-13 ENCOUNTER — Other Ambulatory Visit: Payer: Self-pay

## 2017-05-13 VITALS — BP 160/100 | HR 76 | Temp 97.7°F | Resp 16 | Ht 72.0 in | Wt 286.4 lb

## 2017-05-13 DIAGNOSIS — J4530 Mild persistent asthma, uncomplicated: Secondary | ICD-10-CM | POA: Insufficient documentation

## 2017-05-13 DIAGNOSIS — Z8659 Personal history of other mental and behavioral disorders: Secondary | ICD-10-CM

## 2017-05-13 DIAGNOSIS — G4700 Insomnia, unspecified: Secondary | ICD-10-CM | POA: Insufficient documentation

## 2017-05-13 DIAGNOSIS — E669 Obesity, unspecified: Secondary | ICD-10-CM | POA: Insufficient documentation

## 2017-05-13 DIAGNOSIS — M25552 Pain in left hip: Secondary | ICD-10-CM | POA: Insufficient documentation

## 2017-05-13 DIAGNOSIS — F419 Anxiety disorder, unspecified: Principal | ICD-10-CM

## 2017-05-13 DIAGNOSIS — I10 Essential (primary) hypertension: Secondary | ICD-10-CM | POA: Insufficient documentation

## 2017-05-13 DIAGNOSIS — M5441 Lumbago with sciatica, right side: Secondary | ICD-10-CM | POA: Insufficient documentation

## 2017-05-13 DIAGNOSIS — Z79899 Other long term (current) drug therapy: Secondary | ICD-10-CM | POA: Insufficient documentation

## 2017-05-13 DIAGNOSIS — G8929 Other chronic pain: Secondary | ICD-10-CM | POA: Insufficient documentation

## 2017-05-13 DIAGNOSIS — Z7951 Long term (current) use of inhaled steroids: Secondary | ICD-10-CM | POA: Insufficient documentation

## 2017-05-13 DIAGNOSIS — F32A Depression, unspecified: Secondary | ICD-10-CM

## 2017-05-13 DIAGNOSIS — E785 Hyperlipidemia, unspecified: Secondary | ICD-10-CM | POA: Insufficient documentation

## 2017-05-13 DIAGNOSIS — F329 Major depressive disorder, single episode, unspecified: Secondary | ICD-10-CM

## 2017-05-13 MED ORDER — TRAZODONE HCL 50 MG PO TABS: 25.0000 mg | ORAL_TABLET | Freq: Every evening | ORAL | 0 refills | Status: DC | PRN

## 2017-05-13 MED ORDER — CLONIDINE HCL 0.2 MG PO TABS
0.2000 mg | ORAL_TABLET | Freq: Once | ORAL | Status: AC
  Administered 2017-05-13: 0.2 mg via ORAL

## 2017-05-13 MED ORDER — CARVEDILOL 25 MG PO TABS: 25.0000 mg | ORAL_TABLET | Freq: Two times a day (BID) | ORAL | 2 refills | Status: DC

## 2017-05-13 MED ORDER — LOSARTAN POTASSIUM 50 MG PO TABS: 50.0000 mg | ORAL_TABLET | Freq: Every day | ORAL | 2 refills | Status: DC

## 2017-05-13 MED ORDER — ALBUTEROL SULFATE (2.5 MG/3ML) 0.083% IN NEBU
2.5000 mg | INHALATION_SOLUTION | RESPIRATORY_TRACT | Status: DC | PRN
  Administered 2017-05-13: 2.5 mg via RESPIRATORY_TRACT

## 2017-05-13 MED ORDER — NAPROXEN 500 MG PO TABS: 500.0000 mg | ORAL_TABLET | Freq: Two times a day (BID) | ORAL | 0 refills | Status: DC

## 2017-05-13 MED ORDER — MONTELUKAST SODIUM 10 MG PO TABS: 10.0000 mg | ORAL_TABLET | Freq: Every day | ORAL | 11 refills | Status: DC

## 2017-05-13 MED ORDER — FLUTICASONE-SALMETEROL 100-50 MCG/DOSE IN AEPB: 1.0000 | INHALATION_SPRAY | Freq: Two times a day (BID) | RESPIRATORY_TRACT | 2 refills | Status: DC

## 2017-05-13 MED ORDER — METHOCARBAMOL 500 MG PO TABS: 500.0000 mg | ORAL_TABLET | Freq: Three times a day (TID) | ORAL | 0 refills | Status: DC | PRN

## 2017-05-13 MED ORDER — ALBUTEROL SULFATE HFA 108 (90 BASE) MCG/ACT IN AERS: 2.0000 | INHALATION_SPRAY | Freq: Four times a day (QID) | RESPIRATORY_TRACT | 2 refills | Status: DC | PRN

## 2017-05-13 MED ORDER — AMLODIPINE BESYLATE 10 MG PO TABS: 10.0000 mg | ORAL_TABLET | Freq: Every day | ORAL | 2 refills | Status: DC

## 2017-05-13 MED FILL — traZODone HCL 50 MG TABS: 50 | 30 days supply | Qty: 30 | Fill #0

## 2017-05-13 MED FILL — AMLODIPINE BESYLATE 10 MG T: 10 | 30 days supply | Qty: 30 | Fill #0

## 2017-05-13 MED FILL — ?CARVEDILOL 25 MG TABLET: 25 | 30 days supply | Qty: 60 | Fill #0

## 2017-05-13 MED FILL — LOSARTAN POTASSIUM 50 MG TA: 50 | 30 days supply | Qty: 30 | Fill #0

## 2017-05-13 MED FILL — NAPROXEN 500 MG TABLET: 500 | 30 days supply | Qty: 60 | Fill #0

## 2017-05-13 MED FILL — ?MONTELUKAST SOD 10MG TAB: 10 | 30 days supply | Qty: 30 | Fill #0

## 2017-05-13 MED FILL — METHOCARBAMOL 500 MG TABS: 500 | 13 days supply | Qty: 40 | Fill #0

## 2017-05-13 NOTE — BH Specialist Note (Signed)
Integrated Behavioral Health Initial Visit  MRN: [id number] Name: Chris [name]  Number of [place] Clinician visits:: 1/6 Session Start time: 11:10 AM  Session End time: 11:40 AM Total time: 30 minutes  Type of Service: [place] Interpretor:No. Interpretor Name and Language: N/A   Warm Hand Off Completed.       SUBJECTIVE: Chris [name] is a 56 y.o. male accompanied by self Patient was referred by FNP [place] for anxiety and depression. Patient reports the following symptoms/concerns: feelings of sadness and worry, difficulty sleeping, low energy, irritability, and hx of suicidal ideations Duration of problem: "4-5 years ago"; Severity of problem: severe  OBJECTIVE: Mood: Anxious and Affect: Appropriate Risk of harm to self or others: No plan to harm self or others Pt has hx of SI; however, denied current plan or intent to harm self or others  LIFE CONTEXT: Family and Social: Pt resides with his brother. He has a strained relationship with his other siblings School/Work: Pt is unemployed. He is in the process of applying for disability Self-Care: Pt smokes cigarettes (1ppd) Denies any other form of substance use Life Changes: Pt was incarcerated and recently released. He is residing with a sibling; however, would like to gain independence  GOALS ADDRESSED: Patient will: 1. Reduce symptoms of: agitation, anxiety and depression 2. Increase knowledge and/or ability of: coping skills  3. Demonstrate ability to: Increase healthy adjustment to current life circumstances and Increase adequate support systems for patient/family  INTERVENTIONS: Interventions utilized: Solution-Focused Strategies, Supportive Counseling, Psychoeducation and/or Health Education and Link to [employer]  Standardized Assessments completed: GAD-7 and PHQ 2&9 with C-SSRS  ASSESSMENT: Patient currently experiencing depression, anxiety,  and difficulty managing anger. He reports feelings of sadness and worry, difficulty sleeping, low energy, irritability, and hx of suicidal ideations. He denies SI/HI/AVH. Pt receives family support.   Patient may benefit from psychoeducation, psychotherapy, and medication management. [facility] educated pt on correlation between physical and mental health. Pt identified triggers and warning signs to anger. LCSWA discussed multiple techniques to assist in managing anger and provided a worksheet on coping skills. Pt is open to behavioral health services. LCSWA provided community resources to assist with crisis intervention, medication management, and psychotherapy.  PLAN: 1. Follow up with behavioral health clinician on : Pt was encouraged to contact LCSWA if symptoms worsen or fail to improve to schedule behavioral appointments at [facility]. 2. Behavioral recommendations: LCSWA recommends that pt apply healthy coping skills discussed. Pt is encouraged to schedule follow up appointment with LCSWA 3. Referral(s): [facility] (LME/Outside Clinic) 4. "From scale of 1-10, how likely are you to follow plan?": 8/10  Rebekah Chesterfield, LCSW 05/16/17 4:50 PM

## 2017-05-13 NOTE — Progress Notes (Signed)
Concerns with left hip pain BP- out of meds gout

## 2017-05-13 NOTE — Patient Instructions (Signed)

## 2017-05-14 LAB — CMP AND LIVER
ALT: 16 IU/L (ref 0–44)
AST: 15 IU/L (ref 0–40)
Albumin: 4.1 g/dL (ref 3.5–5.5)
Alkaline Phosphatase: 86 IU/L (ref 39–117)
BILIRUBIN TOTAL: 0.2 mg/dL (ref 0.0–1.2)
BUN: 19 mg/dL (ref 6–24)
Bilirubin, Direct: 0.08 mg/dL (ref 0.00–0.40)
CALCIUM: 9.6 mg/dL (ref 8.7–10.2)
CHLORIDE: 103 mmol/L (ref 96–106)
CO2: 23 mmol/L (ref 20–29)
Creatinine, Ser: 1.53 mg/dL — ABNORMAL HIGH (ref 0.76–1.27)
GFR calc Af Amer: 59 mL/min/{1.73_m2} — ABNORMAL LOW (ref >59)
GFR calc non Af Amer: 51 mL/min/{1.73_m2} — ABNORMAL LOW (ref >59)
GLUCOSE: 102 mg/dL — AB (ref 65–99)
Potassium: 4.7 mmol/L (ref 3.5–5.2)
SODIUM: 139 mmol/L (ref 134–144)
TOTAL PROTEIN: 7.4 g/dL (ref 6.0–8.5)

## 2017-05-14 LAB — LIPID PANEL
Chol/HDL Ratio: 7 ratio — ABNORMAL HIGH (ref 0.0–5.0)
Cholesterol, Total: 230 mg/dL — ABNORMAL HIGH (ref 100–199)
HDL: 33 mg/dL — ABNORMAL LOW (ref >39)
LDL Calculated: 166 mg/dL — ABNORMAL HIGH (ref 0–99)
Triglycerides: 157 mg/dL — ABNORMAL HIGH (ref 0–149)
VLDL CHOLESTEROL CAL: 31 mg/dL (ref 5–40)

## 2017-05-17 NOTE — Progress Notes (Signed)
Subjective:  Patient ID: Chris [name], male    DOB: September 30, 1960  Age: 56 y.o. MRN: [id number]  CC: Establish Care   HPI Chris [name] presents to establish care.  Chris [name] previous incarceration with recent release from prison. Depression:  Chris [name] complains of depressed mood, fatigue, insomnia, suicidal thoughts without plan and irritability.. Onset was approximately almost 5 years ago, gradually worsening since that time.  Chris [name] denies current suicidal and homicidal plan or intent.   Possible organic causes contributing are: none.  Risk factors: negative life event incarceration .  Chris [name] reports taking medication for symptoms in the past however not taking any currently.  Chris [name] does not remember which medications Chris [name] took for symptoms previously.  Hip pain: Several months ago.  Chris [name] reports a left hip lower back area.  Pain 8 out of 10 associated symptoms include paresthesia to right lower extremity.  Chris [name] denies taking anything for pain.  Chris [name] reports a history of lifting weights Chris [name] felt a sensation of "popping".  Chris [name] denies any bowel or bladder incontinence or decreased ability to walk.  History of hypertension:. Chris [name] is not exercising and is not adherent to low salt diet.  Chris [name] does not check BP at home. Cardiac symptoms none. Patient denies chest pain, chest pressure/discomfort, claudication, dyspnea, lower extremity edema, near-syncope, palpitations and syncope.  Cardiovascular risk factors: hypertension, male gender, obesity (BMI >= 30 kg/m2) and sedentary lifestyle. Use of agents associated with hypertension: none. History of target organ damage: none. History of asthma.  Chris [name] denies any shortness of breath, cough, or fevers. Chris [name] currently not in exacerbation.  Chris [name] does not have inhalers.  Chris [name] has not required ED admission for his asthma.   Outpatient Medications Prior to Visit  Medication Sig Dispense Refill  . atorvastatin (LIPITOR) 20 MG tablet Take 20 mg by mouth daily.    [name] amLODipine (NORVASC) 10 MG tablet Take 1  tablet (10 mg total) by mouth daily. 30 tablet 3  . carvedilol (COREG) 12.5 MG tablet Take 2 tablets (25 mg total) by mouth 2 (two) times daily with a meal. 120 tablet 3  . mupirocin cream (BACTROBAN) 2 % Apply 1 application topically 2 (two) times daily. Apply to the honey colored scaly areas of the rash (Patient not taking: Reported on 05/13/2017) 15 g 0  . albuterol (PROVENTIL HFA;VENTOLIN HFA) 108 (90 BASE) MCG/ACT inhaler Inhale 1-2 puffs into the lungs every 6 (six) hours as needed for wheezing or shortness of breath. (Patient not taking: Reported on 05/13/2017) 1 Inhaler 3  . carbamazepine (TEGRETOL) 200 MG tablet Take 1 tablet (200 mg total) by mouth 2 (two) times daily after a meal. (Patient not taking: Reported on 05/13/2017) 30 tablet 0  . cetirizine (ZYRTEC) 10 MG tablet Take 1 tablet (10 mg total) by mouth daily. (Patient not taking: Reported on 05/13/2017) 30 tablet 11  . Fluticasone-Salmeterol (ADVAIR) 250-50 MCG/DOSE AEPB Inhale 1 puff into the lungs 2 (two) times daily. (Patient not taking: Reported on 05/13/2017) 60 each 3  . hydrochlorothiazide (HYDRODIURIL) 12.5 MG tablet Take 1 tablet (12.5 mg total) by mouth daily. (Patient not taking: Reported on 05/13/2017) 30 tablet 3  . hydrocortisone 2.5 % cream Apply topically 2 (two) times daily. (Patient not taking: Reported on 05/13/2017) 30 g 0  . potassium chloride SA (K-DUR,KLOR-CON) 20 MEQ tablet Take 1 tablet (20 mEq total) by mouth daily. (Patient not taking: Reported on 05/13/2017) 30 tablet 3  . sulfamethoxazole-trimethoprim (BACTRIM) 400-80 MG per tablet Take 1 tablet  by mouth 2 (two) times daily. (Patient not taking: Reported on 05/13/2017) 20 tablet 0   No facility-administered medications prior to visit.     ROS Review of Systems  Constitutional: Negative.   Eyes: Negative.   Respiratory: Negative.   Cardiovascular: Negative.   Gastrointestinal: Negative.   Musculoskeletal: Positive for arthralgias, back pain and  myalgias.  Skin: Negative.   Neurological:       Paresthesias  Psychiatric/Behavioral: Positive for dysphoric mood and sleep disturbance. Negative for suicidal ideas. The patient is nervous/anxious.         Objective:  BP (!) 160/100 (BP Location: Right Arm, Cuff Size: Large)   Pulse 76   Temp 97.7 F (36.5 C) (Oral)   Resp 16   Ht 6' (1.829 m)   Wt 286 lb 6.4 oz (129.9 kg)   SpO2 94%   BMI 38.84 kg/m   BP/Weight 05/13/2017 10/25/2014 41/32/4401  Systolic BP 027 253 664  Diastolic BP 403 90 80  Wt. (Lbs) 286.4 224 230  BMI 38.84 30.37 28.01  Some encounter information is confidential and restricted. Go to Review Flowsheets activity to see all data.     Physical Exam  Constitutional: Chris [name] is oriented to person, place, and time. Chris [name] appears well-developed and well-nourished.  Obese  HENT:  Head: Normocephalic and atraumatic.  Right Ear: External ear normal.  Left Ear: External ear normal.  Nose: Nose normal.  Mouth/Throat: Oropharynx is clear and moist.  Eyes: Conjunctivae are normal. Pupils are equal, round, and reactive to light.  Neck: No JVD present.  Cardiovascular: Normal rate, regular rhythm, normal heart sounds and intact distal pulses.  Pulmonary/Chest: Effort normal and breath sounds normal.  Abdominal: Soft. Bowel sounds are normal. There is no tenderness.  Musculoskeletal:       Lumbar back: Chris [name] exhibits pain. Chris [name] exhibits normal range of motion.  Left hip pain with passive and active range of motion.  Neurological: Chris [name] is alert and oriented to person, place, and time. Chris [name] has normal reflexes.  Skin: Skin is warm and dry.  Psychiatric: His mood appears anxious. Chris [name] expresses no homicidal and no suicidal ideation. Chris [name] expresses no suicidal plans and no homicidal plans.  Nursing note and vitals reviewed.   Depression screen [facility] 2/9 05/13/2017 04/23/2014  Decreased Interest 3 0  Down, Depressed, Hopeless 2 0  PHQ - 2 Score 5 0  Altered sleeping 3 -  Tired,  decreased energy 3 -  Change in appetite 0 -  Feeling bad or failure about yourself  1 -  Trouble concentrating 0 -  Moving slowly or fidgety/restless 0 -  Suicidal thoughts 1 -  PHQ-9 Score 13 -    GAD 7 : Generalized Anxiety Score 05/13/2017  Nervous, Anxious, on Edge 2  Control/stop worrying 2  Worry too much - different things 3  Trouble relaxing 3  Restless 3  Easily annoyed or irritable 2  Afraid - awful might happen 3  Total GAD 7 Score 18       Assessment & Plan:   1. Essential hypertension Losartan added for better BP control. Schedule BP recheck in 2 weeks with nurse. If BP is greater than 90/60 (MAP 65 or greater) but not less than 130/80 may increase dose to 100 mg daily and recheck in another 2 weeks.  - cloNIDine (CATAPRES) tablet 0.2 mg - carvedilol (COREG) 25 MG tablet; Take 1 tablet (25 mg total) by mouth 2 (two) times daily with a meal.  Dispense: 60 tablet;  Refill: 2 - losartan (COZAAR) 50 MG tablet; Take 1 tablet (50 mg total) by mouth daily.  Dispense: 30 tablet; Refill: 2 - amLODipine (NORVASC) 10 MG tablet; Take 1 tablet (10 mg total) by mouth daily.  Dispense: 30 tablet; Refill: 2 - Lipid Panel - CMP and Liver  2. Chronic bilateral low back pain with right-sided sciatica  - naproxen (NAPROSYN) 500 MG tablet; Take 1 tablet (500 mg total) by mouth 2 (two) times daily with a meal.  Dispense: 60 tablet; Refill: 0 - DG Lumbar Spine Complete; Future - methocarbamol (ROBAXIN) 500 MG tablet; Take 1 tablet (500 mg total) by mouth every 8 (eight) hours as needed for muscle spasms.  Dispense: 40 tablet; Refill: 0  3. Left hip pain  - naproxen (NAPROSYN) 500 MG tablet; Take 1 tablet (500 mg total) by mouth 2 (two) times daily with a meal.  Dispense: 60 tablet; Refill: 0 - methocarbamol (ROBAXIN) 500 MG tablet; Take 1 tablet (500 mg total) by mouth every 8 (eight) hours as needed for muscle spasms.  Dispense: 40 tablet; Refill: 0  4. Mild persistent asthma  without complication  - albuterol (PROVENTIL) (2.5 MG/3ML) 0.083% nebulizer solution 2.5 mg - albuterol (PROVENTIL HFA;VENTOLIN HFA) 108 (90 Base) MCG/ACT inhaler; Inhale 2 puffs into the lungs every 6 (six) hours as needed for wheezing or shortness of breath.  Dispense: 18 g; Refill: 2 - Fluticasone-Salmeterol (ADVAIR DISKUS) 100-50 MCG/DOSE AEPB; Inhale 1 puff into the lungs 2 (two) times daily.  Dispense: 60 each; Refill: 2 - montelukast (SINGULAIR) 10 MG tablet; Take 1 tablet (10 mg total) by mouth at bedtime.  Dispense: 30 tablet; Refill: 11  5. Dyslipidemia  - Lipid Panel  6. History of major depression LCSW spoke with patient and provided resources. - traZODone (DESYREL) 50 MG tablet; Take 0.5-1 tablets (25-50 mg total) by mouth at bedtime as needed for sleep.  Dispense: 30 tablet; Refill: 0      Follow-up: Return in about 2 weeks (around 05/27/2017) for BP check with [place].   Alfonse Spruce FNP

## 2017-05-18 ENCOUNTER — Other Ambulatory Visit: Payer: Self-pay | Admitting: Family Medicine

## 2017-05-18 DIAGNOSIS — E782 Mixed hyperlipidemia: Secondary | ICD-10-CM

## 2017-05-18 MED ORDER — ATORVASTATIN CALCIUM 40 MG PO TABS: 40.0000 mg | ORAL_TABLET | Freq: Every day | ORAL | 2 refills | Status: DC

## 2017-05-19 ENCOUNTER — Telehealth: Payer: Self-pay

## 2017-05-19 ENCOUNTER — Telehealth: Payer: Self-pay | Admitting: Family Medicine

## 2017-05-19 NOTE — Telephone Encounter (Signed)
CMA call regarding lab results   Patient did not answer but left a VM stating the reason of the call &  to call me back  

## 2017-05-19 NOTE — Telephone Encounter (Signed)
-----   Message from Alfonse Spruce, [place] sent at 05/18/2017  5:47 PM EST ----- Lipid levels were elevated. This can increase your risk of heart disease overtime. You will be prescribed atorvastatin. Start eating a diet low in saturated fat. Limit your intake of fried foods, red meats, and whole milk. Recommend f/u in 3 months. -Kidney function is stable since last check but is still decreased. Levels indicate you have chronic kidney disease stage . -Continue to take your medications for blood pressure, avoid taking NSAID medications, reduce salt intake to 2 to 4 grams/day, do not smoke. -Recommend monitoring again in 6 months. If your levels have significantly increased you will be referred to nephrology.

## 2017-05-19 NOTE — Telephone Encounter (Signed)
Pt called back about his lab results, Nurse was unavailable if you could return the call Please follow up

## 2017-05-20 ENCOUNTER — Other Ambulatory Visit: Payer: Self-pay | Admitting: Family Medicine

## 2017-05-20 ENCOUNTER — Telehealth: Payer: Self-pay | Admitting: Family Medicine

## 2017-05-20 NOTE — Telephone Encounter (Signed)
CMA call regarding lab results   Patient Verify DOB   Patient was aware and understood  

## 2017-05-20 NOTE — Telephone Encounter (Signed)
Called with and spoke with patient. He reports symptoms of headache with antihypertensive use. He denies any sob, nausea, vomiting, vision changes, or weakness. Patient has long-standing history of HTN and recently restated medications, suspect  headaches when taking antihypertensive medications could be related to this. He reports upcoming office visit on Monday, encouraged patient to check his BP and bring readings to office visit and bring He communicates understanding and is agreeable to plan.

## 2017-05-20 NOTE — Telephone Encounter (Signed)
Patient is aware about results but he stated that every time he takes his BP med he has headaches

## 2017-05-23 ENCOUNTER — Ambulatory Visit: Payer: Self-pay

## 2017-05-27 ENCOUNTER — Ambulatory Visit: Payer: Self-pay | Attending: Family Medicine | Admitting: *Deleted

## 2017-05-27 VITALS — BP 156/100

## 2017-05-27 DIAGNOSIS — I10 Essential (primary) hypertension: Secondary | ICD-10-CM | POA: Insufficient documentation

## 2017-05-27 NOTE — Progress Notes (Addendum)
Pt arrived to [facility]. Pt alert and oriented and arrives in good spirits. Pt denies chest pain, SOB, HA, dizziness, or blurred vision.  Verified medication. Pt states medication he did has not taken this morning. Pt was asked to reschedule appointment. Instructed to take medication prior to blood pressure check.  Appointment scheduled for patient to return on next week.  Blood pressure reading: 156/100

## 2017-05-30 ENCOUNTER — Ambulatory Visit
Admission: RE | Admit: 2017-05-30 | Discharge: 2017-05-30 | Disposition: A | Discharge status: Home / Self Care | Payer: No Typology Code available for payment source | Source: Ambulatory Visit | Attending: Family Medicine | Admitting: Family Medicine

## 2017-05-30 DIAGNOSIS — M5441 Lumbago with sciatica, right side: Principal | ICD-10-CM

## 2017-05-30 DIAGNOSIS — G8929 Other chronic pain: Secondary | ICD-10-CM

## 2017-06-03 ENCOUNTER — Other Ambulatory Visit: Payer: Self-pay | Admitting: Family Medicine

## 2017-06-03 DIAGNOSIS — M4306 Spondylolysis, lumbar region: Secondary | ICD-10-CM

## 2017-06-03 DIAGNOSIS — M479 Spondylosis, unspecified: Secondary | ICD-10-CM

## 2017-06-03 DIAGNOSIS — Z8781 Personal history of (healed) traumatic fracture: Secondary | ICD-10-CM

## 2017-06-15 ENCOUNTER — Ambulatory Visit: Payer: Self-pay

## 2017-06-24 ENCOUNTER — Emergency Department (HOSPITAL_COMMUNITY)
Admission: EM | Admit: 2017-06-24 | Discharge: 2017-06-24 | Disposition: A | Discharge status: Home / Self Care | Payer: No Typology Code available for payment source | Attending: Emergency Medicine | Admitting: Emergency Medicine

## 2017-06-24 ENCOUNTER — Other Ambulatory Visit: Payer: Self-pay

## 2017-06-24 ENCOUNTER — Encounter (HOSPITAL_COMMUNITY): Payer: Self-pay | Admitting: Emergency Medicine

## 2017-06-24 DIAGNOSIS — I130 Hypertensive heart and chronic kidney disease with heart failure and stage 1 through stage 4 chronic kidney disease, or unspecified chronic kidney disease: Secondary | ICD-10-CM | POA: Insufficient documentation

## 2017-06-24 DIAGNOSIS — M5441 Lumbago with sciatica, right side: Secondary | ICD-10-CM | POA: Insufficient documentation

## 2017-06-24 DIAGNOSIS — Z79899 Other long term (current) drug therapy: Secondary | ICD-10-CM | POA: Insufficient documentation

## 2017-06-24 DIAGNOSIS — J449 Chronic obstructive pulmonary disease, unspecified: Secondary | ICD-10-CM | POA: Insufficient documentation

## 2017-06-24 DIAGNOSIS — F17210 Nicotine dependence, cigarettes, uncomplicated: Secondary | ICD-10-CM | POA: Insufficient documentation

## 2017-06-24 DIAGNOSIS — I5022 Chronic systolic (congestive) heart failure: Secondary | ICD-10-CM | POA: Insufficient documentation

## 2017-06-24 DIAGNOSIS — N189 Chronic kidney disease, unspecified: Secondary | ICD-10-CM | POA: Insufficient documentation

## 2017-06-24 MED ORDER — CYCLOBENZAPRINE HCL 10 MG PO TABS: 10.0000 mg | ORAL_TABLET | Freq: Two times a day (BID) | ORAL | 0 refills | Status: DC | PRN

## 2017-06-24 MED ORDER — KETOROLAC TROMETHAMINE 60 MG/2ML IM SOLN
60.0000 mg | Freq: Once | INTRAMUSCULAR | Status: AC
  Administered 2017-06-24: 60 mg via INTRAMUSCULAR
  Filled 2017-06-24: qty 2

## 2017-06-24 MED ORDER — PREDNISONE 20 MG PO TABS: 40.0000 mg | ORAL_TABLET | Freq: Every day | ORAL | 0 refills | Status: AC

## 2017-06-24 NOTE — ED Notes (Signed)
See EDP secondary assessment.  

## 2017-06-24 NOTE — Discharge Instructions (Signed)
Please take muscle relaxer medicine I have prescribed to you.  This medicine can make you drowsy so please do not work, drive or drink alcohol while taking it.  I have also written you a prescription for a steroid medicine which can help with inflammation.  Please take 40 mg daily for the next 5 days.  Schedule an appointment with your primary doctor for recheck if your symptoms are not improving in a week.  Please also have your blood pressure rechecked, as it was elevated here today.  Return to the ER if you have worsening back pain with fever greater than 100.4 F, back pain in which you can no longer feel your legs or feet, back pain in which you lose bowel or bladder control or have any new or worsening symptoms.

## 2017-06-24 NOTE — ED Triage Notes (Signed)
Pt to ER for evaluation of lower back pain onset "weeks" ago with radiation down right leg. Pt in NAD. Ambulatory. Denies bowel/bladder incontinence

## 2017-06-24 NOTE — ED Provider Notes (Signed)
[facility] EMERGENCY DEPARTMENT Provider Note   CSN: [id number] Arrival date & time: 06/24/17  7902     History   Chief Complaint Chief Complaint  Patient presents with  . Back Pain    HPI Chris [name] is a 57 y.o. male.  HPI   Chris [name] is a 57yo male with a history of HTN, depression, cocaine abuse, marijuana use, chronic back pain who presents to the emergency department for evaluation of bilateral lower back pain.  Patient states that he felt acute onset lower back pain after lifting a heavy item at work four days ago. The pain was sharp and severe and radiated down his right buttocks into the right posterior thigh.  He states that since that time he has had persistent bilateral lower back pain which is "throbbing" in nature and worsened with bending, twisting at the hip or with ambulation.  He has been using marijuana for his pain without significant relief.  He is also tried ibuprofen and Tylenol which have not helped him.  He denies numbness, weakness, fever, loss of bowel or bladder control, abdominal pain, nausea/vomiting, fevers, chills, night sweats, unexpected weight changes.  He denies history of IV drug use.  He denies history of cancer.  He is able to ambulate independently although painful.  Asking if he can get a work note.  Denies previous back surgeries.  Past Medical History:  Diagnosis Date  . CHF (congestive heart failure) ([place])   . Chronic kidney disease   . COPD (chronic obstructive pulmonary disease) ([place])   . Depression   . Gout   . Hypertension   . Mental disorder     Patient Active Problem List   Diagnosis Date Noted  . Chronic systolic heart failure ([place]) 10/25/2014  . Major depression, recurrent ([place]) 02/20/2013  . Cocaine abuse ([place]) 02/20/2013  . Cannabis abuse 02/20/2013  . Pain, dental 02/20/2013  . Back pain, chronic 02/20/2013    Past Surgical History:  Procedure Laterality Date  . ANKLE SURGERY    . CARDIAC  CATHETERIZATION    . SHOULDER SURGERY         Home Medications    Prior to Admission medications   Medication Sig Start Date End Date Taking? Authorizing Provider  albuterol (PROVENTIL HFA;VENTOLIN HFA) 108 (90 Base) MCG/ACT inhaler Inhale 2 puffs into the lungs every 6 (six) hours as needed for wheezing or shortness of breath. Patient not taking: Reported on 05/27/2017 05/13/17   Alfonse Spruce, FNP  amLODipine (NORVASC) 10 MG tablet Take 1 tablet (10 mg total) by mouth daily. 05/13/17   Alfonse Spruce, FNP  atorvastatin (LIPITOR) 40 MG tablet Take 1 tablet (40 mg total) by mouth daily at 6 PM. 05/18/17   Alfonse Spruce, FNP  carvedilol (COREG) 25 MG tablet Take 1 tablet (25 mg total) by mouth 2 (two) times daily with a meal. 05/13/17   Hairston, Mandesia R, FNP  Fluticasone-Salmeterol (ADVAIR DISKUS) 100-50 MCG/DOSE AEPB Inhale 1 puff into the lungs 2 (two) times daily. Patient not taking: Reported on 05/27/2017 05/13/17   Alfonse Spruce, FNP  losartan (COZAAR) 50 MG tablet Take 1 tablet (50 mg total) by mouth daily. 05/13/17   Alfonse Spruce, FNP  methocarbamol (ROBAXIN) 500 MG tablet Take 1 tablet (500 mg total) by mouth every 8 (eight) hours as needed for muscle spasms. 05/13/17   Alfonse Spruce, FNP  montelukast (SINGULAIR) 10 MG tablet Take 1 tablet (10 mg total)  by mouth at bedtime. 05/13/17   Alfonse Spruce, FNP  naproxen (NAPROSYN) 500 MG tablet Take 1 tablet (500 mg total) by mouth 2 (two) times daily with a meal. 05/13/17   Hairston, Maylon Peppers, FNP  traZODone (DESYREL) 50 MG tablet Take 0.5-1 tablets (25-50 mg total) by mouth at bedtime as needed for sleep. 05/13/17   Alfonse Spruce, FNP    Family History History reviewed. No pertinent family history.  Social History Social History   Tobacco Use  . Smoking status: Current Every Day Smoker    Packs/day: 1.00    Types: Cigarettes  . Smokeless tobacco: Never Used  Substance  Use Topics  . Alcohol use: No  . Drug use: Yes    Frequency: 21.0 times per week    Types: Marijuana    Comment: no longer- Cocaine     Allergies   Patient has no known allergies.   Review of Systems Review of Systems  Constitutional: Negative for chills, diaphoresis, fatigue, fever and unexpected weight change.  Gastrointestinal: Positive for abdominal pain. Negative for diarrhea, nausea and vomiting.  Genitourinary: Negative for difficulty urinating, dysuria, flank pain and frequency.  Musculoskeletal: Positive for back pain (lower). Negative for gait problem.  Skin: Negative for wound.  Neurological: Negative for weakness and numbness.     Physical Exam Updated Vital Signs BP (!) 148/82   Pulse 66   Temp 97.7 F (36.5 C) (Oral)   Resp 20   SpO2 98%   Physical Exam  Constitutional: He is oriented to person, place, and time. He appears well-developed and well-nourished. No distress.  HENT:  Head: Normocephalic and atraumatic.  Eyes: Right eye exhibits no discharge. Left eye exhibits no discharge.  Pulmonary/Chest: Effort normal. No respiratory distress.  Abdominal: Soft. Bowel sounds are normal. There is no tenderness. There is no guarding.  No pulsatile mass.  No CVA tenderness.  Musculoskeletal:  Tenderness to palpation over several spinous processes of the thoracic spine and bilateral paraspinal muscles.  No bruising, erythema or rash noted on the skin.  Strength 5/5 in bilateral knee flexion/extension and ankle dorsiflexion/plantarflexion.  DP pulses 2+ bilaterally.  Neurological: He is alert and oriented to person, place, and time. Coordination normal.  Patellar reflex 2+ bilaterally.  Distal sensation to light touch intact in bilateral lower extremities.  Gait normal and coordination and balance, although painful.  Skin: Skin is warm and dry. Capillary refill takes less than 2 seconds. He is not diaphoretic.  Psychiatric: He has a normal mood and affect. His  behavior is normal.  Nursing note and vitals reviewed.    ED Treatments / Results  Labs (all labs ordered are listed, but only abnormal results are displayed) Labs Reviewed - No data to display  EKG  EKG Interpretation None       Radiology No results found.  Procedures Procedures (including critical care time)  Medications Ordered in ED Medications - No data to display   Initial Impression / Assessment and Plan / ED Course  I have reviewed the triage vital signs and the nursing notes.  Pertinent labs & imaging results that were available during my care of the patient were reviewed by me and considered in my medical decision making (see chart for details).    Patient with back pain. No neurological deficits and normal neuro exam.  Patient can walk but states is painful. No loss of bowel or bladder control. No concern for cauda equina.  No fever, night sweats, weight loss,  h/o cancer, IVDU. RICE protocol and pain medicine indicated and discussed with patient.  Have counseled patient to recheck his blood pressure which was elevated in the ER today.  Discussed return precautions and patient agrees and voiced understanding.  Final Clinical Impressions(s) / ED Diagnoses   Final diagnoses:  Acute bilateral low back pain with right-sided sciatica    ED Discharge Orders        Ordered    cyclobenzaprine (FLEXERIL) 10 MG tablet  2 times daily PRN     06/24/17 1233    predniSONE (DELTASONE) 20 MG tablet  Daily     06/24/17 1233       Bernarda Caffey 06/24/17 1236    Lajean Saver, MD 06/24/17 1542

## 2017-07-05 ENCOUNTER — Ambulatory Visit: Payer: Self-pay | Attending: Family Medicine | Admitting: Family Medicine

## 2017-07-05 ENCOUNTER — Encounter: Payer: Self-pay | Admitting: Family Medicine

## 2017-07-05 VITALS — BP 150/85 | HR 76 | Temp 98.6°F | Resp 18 | Ht 72.0 in | Wt 277.0 lb

## 2017-07-05 DIAGNOSIS — Z79899 Other long term (current) drug therapy: Secondary | ICD-10-CM | POA: Insufficient documentation

## 2017-07-05 DIAGNOSIS — M25551 Pain in right hip: Secondary | ICD-10-CM | POA: Insufficient documentation

## 2017-07-05 DIAGNOSIS — M5441 Lumbago with sciatica, right side: Secondary | ICD-10-CM | POA: Insufficient documentation

## 2017-07-05 DIAGNOSIS — Z7951 Long term (current) use of inhaled steroids: Secondary | ICD-10-CM | POA: Insufficient documentation

## 2017-07-05 DIAGNOSIS — J3089 Other allergic rhinitis: Secondary | ICD-10-CM

## 2017-07-05 DIAGNOSIS — I10 Essential (primary) hypertension: Secondary | ICD-10-CM | POA: Insufficient documentation

## 2017-07-05 DIAGNOSIS — G8929 Other chronic pain: Secondary | ICD-10-CM | POA: Insufficient documentation

## 2017-07-05 DIAGNOSIS — J309 Allergic rhinitis, unspecified: Secondary | ICD-10-CM | POA: Insufficient documentation

## 2017-07-05 MED ORDER — DICLOFENAC SODIUM 75 MG PO TBEC: DELAYED_RELEASE_TABLET | ORAL | 1 refills | Status: DC

## 2017-07-05 MED ORDER — AMLODIPINE BESYLATE 10 MG PO TABS: 10.0000 mg | ORAL_TABLET | Freq: Every day | ORAL | 2 refills | Status: DC

## 2017-07-05 MED ORDER — LOSARTAN POTASSIUM 50 MG PO TABS: 50.0000 mg | ORAL_TABLET | Freq: Every day | ORAL | 2 refills | Status: DC

## 2017-07-05 MED ORDER — LORATADINE 10 MG PO TABS: 10.0000 mg | ORAL_TABLET | Freq: Every day | ORAL | 6 refills | Status: DC | PRN

## 2017-07-05 MED ORDER — TRAMADOL HCL 50 MG PO TABS: 50.0000 mg | ORAL_TABLET | Freq: Three times a day (TID) | ORAL | 0 refills | Status: DC | PRN

## 2017-07-05 MED ORDER — FLUTICASONE PROPIONATE 50 MCG/ACT NA SUSP: 2.0000 | Freq: Every day | NASAL | 3 refills | Status: DC | PRN

## 2017-07-05 MED ORDER — PREDNISONE 10 MG PO TABS: ORAL_TABLET | ORAL | 0 refills | Status: DC

## 2017-07-05 MED ORDER — CARVEDILOL 25 MG PO TABS: 25.0000 mg | ORAL_TABLET | Freq: Two times a day (BID) | ORAL | 2 refills | Status: DC

## 2017-07-05 MED ORDER — CYCLOBENZAPRINE HCL 10 MG PO TABS: 10.0000 mg | ORAL_TABLET | Freq: Two times a day (BID) | ORAL | 1 refills | Status: DC | PRN

## 2017-07-05 MED ORDER — TRAMADOL HCL 50 MG PO TABS
50.0000 mg | ORAL_TABLET | Freq: Three times a day (TID) | ORAL | 0 refills | Status: DC | PRN
Start: 2017-07-05 — End: 2018-04-21

## 2017-07-05 NOTE — Progress Notes (Signed)
Subjective:  Patient ID: Chris [name], male    DOB: 19-Apr-1961  Age: 57 y.o. MRN: [id number]  CC: Hip Pain   HPI  Chris [name] presents for   Hypertension  Disease Monitoring  Blood pressure range: Patient does not check BP.  Chest pain: no   Dyspnea: no   Claudication: no   Medication compliance: no .  Medication Side Effects  Lightheadedness: no   Urinary frequency: no   Edema: no   Impotence: no   Preventitive Healthcare:  Exercise: no   Diet Pattern: regular  Salt Restriction: no  Hip pain  Chronic. Onset of episode last Tuesday. He reports severe knee pain like grinding to the right hip. He also reports joint swelling. Difficulty bearing weight with standing for a few day Wednesday and Thursday. He reports having to lay on his right side, applying pressure to ease hip pain.  Chronic back pain with sciatica Previous work up includes xray. Xray showed the following: multilevel DDD and lumbar spondylosis;  chronic compression fractures of T11 and L4, as above. Referral to orthopedics. He has not applied for orange card yet.    History of Allergies Symptoms include rhinorrhea and sneezing. He is requesting medication for symptoms.   Outpatient Medications Prior to Visit  Medication Sig Dispense Refill  . atorvastatin (LIPITOR) 40 MG tablet Take 1 tablet (40 mg total) by mouth daily at 6 PM. 30 tablet 2  . montelukast (SINGULAIR) 10 MG tablet Take 1 tablet (10 mg total) by mouth at bedtime. 30 tablet 11  . traZODone (DESYREL) 50 MG tablet Take 0.5-1 tablets (25-50 mg total) by mouth at bedtime as needed for sleep. 30 tablet 0  . carvedilol (COREG) 25 MG tablet Take 1 tablet (25 mg total) by mouth 2 (two) times daily with a meal. 60 tablet 2  . cyclobenzaprine (FLEXERIL) 10 MG tablet Take 1 tablet (10 mg total) by mouth 2 (two) times daily as needed for muscle spasms. 20 tablet 0  . methocarbamol (ROBAXIN) 500 MG tablet Take 1 tablet (500 mg total) by mouth  every 8 (eight) hours as needed for muscle spasms. 40 tablet 0  . naproxen (NAPROSYN) 500 MG tablet Take 1 tablet (500 mg total) by mouth 2 (two) times daily with a meal. 60 tablet 0  . albuterol (PROVENTIL HFA;VENTOLIN HFA) 108 (90 Base) MCG/ACT inhaler Inhale 2 puffs into the lungs every 6 (six) hours as needed for wheezing or shortness of breath. (Patient not taking: Reported on 05/27/2017) 18 g 2  . Fluticasone-Salmeterol (ADVAIR DISKUS) 100-50 MCG/DOSE AEPB Inhale 1 puff into the lungs 2 (two) times daily. (Patient not taking: Reported on 05/27/2017) 60 each 2  . amLODipine (NORVASC) 10 MG tablet Take 1 tablet (10 mg total) by mouth daily. (Patient not taking: Reported on 07/05/2017) 30 tablet 2  . losartan (COZAAR) 50 MG tablet Take 1 tablet (50 mg total) by mouth daily. (Patient not taking: Reported on 07/05/2017) 30 tablet 2   Facility-Administered Medications Prior to Visit  Medication Dose Route Frequency Provider Last Rate Last Dose  . albuterol (PROVENTIL) (2.5 MG/3ML) 0.083% nebulizer solution 2.5 mg  2.5 mg Nebulization Q20 Min PRN Fredia Beets R, FNP   2.5 mg at 05/13/17 1015    ROS Review of Systems  Constitutional: Negative.   HENT: Positive for rhinorrhea and sneezing.   Respiratory: Negative.   Cardiovascular: Negative.   Musculoskeletal: Positive for arthralgias, back pain and joint swelling.  Skin: Negative.   Psychiatric/Behavioral:  Negative for suicidal ideas.   Objective:  BP (!) 150/85 (BP Location: Right Arm, Patient Position: Sitting, Cuff Size: Large)   Pulse 76   Temp 98.6 F (37 C) (Oral)   Resp 18   Ht 6' (1.829 m)   Wt 277 lb (125.6 kg)   SpO2 98%   BMI 37.57 kg/m   BP/Weight 07/05/2017 06/24/2017 95/18/8416  Systolic BP 606 301 601  Diastolic BP 85 82 093  Wt. (Lbs) 277 - -  BMI 37.57 - -  Some encounter information is confidential and restricted. Go to Review Flowsheets activity to see all data.     Physical Exam  Constitutional: He  appears well-developed and well-nourished.  HENT:  Head: Normocephalic and atraumatic.  Right Ear: External ear normal.  Left Ear: External ear normal.  Nose: Mucosal edema and rhinorrhea present.  Mouth/Throat: Oropharynx is clear and moist.  Eyes: Conjunctivae are normal. Pupils are equal, round, and reactive to light.  Neck: Normal range of motion. Neck supple. No JVD present.  Cardiovascular: Normal rate, regular rhythm, normal heart sounds and intact distal pulses.  Pulmonary/Chest: Effort normal and breath sounds normal. He has no wheezes.  Abdominal: Soft. Bowel sounds are normal. There is no tenderness.  Musculoskeletal:       Right hip: He exhibits decreased range of motion and tenderness.  Lymphadenopathy:    He has no cervical adenopathy.  Skin: Skin is warm and dry.  Psychiatric: He expresses no homicidal and no suicidal ideation. He expresses no suicidal plans and no homicidal plans. He is communicative. He is attentive.  Nursing note and vitals reviewed.   Assessment & Plan:   1. Chronic bilateral low back pain with right-sided sciatica Encouraged to apply for orange card and discount programs. - cyclobenzaprine (FLEXERIL) 10 MG tablet; Take 1 tablet (10 mg total) by mouth 2 (two) times daily as needed for muscle spasms.  Dispense: 40 tablet; Refill: 1 - diclofenac (VOLTAREN) 75 MG EC tablet; TAKE ONE TABLET BY MOUTH TWICE A DAY AS NEED FOR PAIN. TAKE WITH MEALS.  Dispense: 40 tablet; Refill: 1 - traMADol (ULTRAM) 50 MG tablet; Take 1 tablet (50 mg total) by mouth every 8 (eight) hours as needed for severe pain.  Dispense: 40 tablet; Refill: 0 - predniSONE (DELTASONE) 10 MG tablet; DAY 1: TAKE 6 TABLETS BY MOUTH WITH MEAL; DAY 2: TAKE 5 TABLETS; DAY 3: TAKE 4 TABLETS; DAY 4: TAKE 3 TABLETS; DAY 5: TAKE 2 TABLETS; DAY 6: TAKE 1 TABLET.  Dispense: 21 tablet; Refill: 0  2. Chronic hip pain, right  - DG HIP UNILAT W OR W/O PELVIS 2-3 VIEWS RIGHT; Future - traMADol (ULTRAM)  50 MG tablet; Take 1 tablet (50 mg total) by mouth every 8 (eight) hours as needed for severe pain.  Dispense: 40 tablet; Refill: 0  3. Essential hypertension Schedule BP recheck in 2 weeks with nurse. If BP is greater than 90/60 (MAP 65 or greater) but not less than 130/80 may increase dose of losartan to 100 mg QD and recheck in another 2 weeks.  Follow up with PCP in 3 months. He reports finances as barrier to medications. Medications sent to [facility] pharmacy. - amLODipine (NORVASC) 10 MG tablet; Take 1 tablet (10 mg total) by mouth daily.  Dispense: 30 tablet; Refill: 2 - carvedilol (COREG) 25 MG tablet; Take 1 tablet (25 mg total) by mouth 2 (two) times daily with a meal.  Dispense: 60 tablet; Refill: 2 - losartan (COZAAR) 50 MG tablet;  Take 1 tablet (50 mg total) by mouth daily.  Dispense: 30 tablet; Refill: 2  4. Allergic rhinitis due to other allergic trigger, unspecified seasonality   - loratadine (CLARITIN) 10 MG tablet; Take 1 tablet (10 mg total) by mouth daily as needed for allergies or rhinitis.  Dispense: 30 tablet; Refill: 6 - fluticasone (FLONASE) 50 MCG/ACT nasal spray; Place 2 sprays into both nostrils daily as needed for allergies or rhinitis.  Dispense: 16 g; Refill: 3      Follow-up: Return in about 2 weeks (around 07/19/2017), or if symptoms worsen or fail to improve, for BP check with [facility].   Alfonse Spruce FNP

## 2017-07-13 ENCOUNTER — Ambulatory Visit (HOSPITAL_COMMUNITY)
Admission: RE | Admit: 2017-07-13 | Discharge: 2017-07-13 | Disposition: A | Discharge status: Home / Self Care | Payer: Self-pay | Source: Ambulatory Visit | Attending: Family Medicine | Admitting: Family Medicine

## 2017-07-13 ENCOUNTER — Ambulatory Visit (HOSPITAL_COMMUNITY)
Admission: RE | Admit: 2017-07-13 | Discharge: 2017-07-13 | Disposition: A | Discharge status: Home / Self Care | Payer: Self-pay | Source: Other Acute Inpatient Hospital

## 2017-07-13 DIAGNOSIS — M25551 Pain in right hip: Secondary | ICD-10-CM

## 2017-07-13 DIAGNOSIS — M1611 Unilateral primary osteoarthritis, right hip: Secondary | ICD-10-CM | POA: Insufficient documentation

## 2017-07-13 DIAGNOSIS — G8929 Other chronic pain: Secondary | ICD-10-CM | POA: Insufficient documentation

## 2017-07-15 ENCOUNTER — Other Ambulatory Visit: Payer: Self-pay | Admitting: Family Medicine

## 2017-07-15 ENCOUNTER — Telehealth: Payer: Self-pay

## 2017-07-15 ENCOUNTER — Ambulatory Visit: Payer: Self-pay

## 2017-07-15 ENCOUNTER — Telehealth: Payer: Self-pay | Admitting: Licensed Clinical Social Worker

## 2017-07-15 ENCOUNTER — Telehealth: Payer: Self-pay | Admitting: Family Medicine

## 2017-07-15 DIAGNOSIS — M1611 Unilateral primary osteoarthritis, right hip: Secondary | ICD-10-CM

## 2017-07-15 NOTE — Telephone Encounter (Signed)
Contacted pt to go over xray results pt is aware   Pt states he can't take the pain. Pt states he crawls to the bathroom. He can't walk he can't do anything. Pt states he is going to go the hospital because of the pain and he can't take it anymore. Pt is requesting a call from his pcp. Please f/u

## 2017-07-15 NOTE — Telephone Encounter (Signed)
LCSWA received incoming call. Patient shared that he has been in pain due to arthritis and gout for the last couple of days. He is unable to make it to scheduled financial counseling appointment and is interested in resources to assist with applying for disability.  LCSWA consulted with Financial Counselor, Johnnette Barrios, who agreed to reschedule pt for Monday, February 4, 19. LCSWA will provide requested resources after appointment. Pt was appreciative for assistance.

## 2017-07-15 NOTE — Telephone Encounter (Signed)
Called and spoke with patient. He reports symptoms have lessened, but if symptoms worsen again he will go to ED. ED precautions given. He is agreeable to scheduling office appointment on Tuesday at 4:15 please placed on schedule.

## 2017-07-15 NOTE — Telephone Encounter (Signed)
Called and spoke with patient. He reports symptoms have lessened, but if symptoms worsen again he will go to ED. ED precautions given. He is agreeable to scheduling office appointment on Tuesday. He commuicates understanding and is agreeable with plan.

## 2017-07-18 ENCOUNTER — Ambulatory Visit: Payer: Self-pay

## 2017-07-19 ENCOUNTER — Ambulatory Visit: Payer: Self-pay | Admitting: Family Medicine

## 2017-07-21 ENCOUNTER — Encounter: Payer: Self-pay | Admitting: Pharmacist

## 2017-07-21 NOTE — Progress Notes (Deleted)
   S:    Patient arrives ***.    Presents to the clinic for hypertension evaluation.   Patient {Actions; denies-reports:120008} adherence with medications.  Current BP Medications include:  Losartan 50 mg daily, amlodipine 10 mg daily, carvedilol 25 mg BID  O:   Last 3 Office BP readings: BP Readings from Last 3 Encounters:  07/05/17 (!) 150/85  06/24/17 (!) 148/82  05/27/17 (!) 156/100    BMET    Component Value Date/Time   NA 139 05/13/2017 1158   NA 137 10/23/2013 0514   K 4.7 05/13/2017 1158   K 4.9 10/23/2013 0514   CL 103 05/13/2017 1158   CL 104 10/23/2013 0514   CO2 23 05/13/2017 1158   CO2 27 10/23/2013 0514   GLUCOSE 102 (H) 05/13/2017 1158   GLUCOSE 99 03/19/2014 0029   GLUCOSE 115 (H) 10/23/2013 0514   BUN 19 05/13/2017 1158   BUN 39 (H) 10/23/2013 0514   CREATININE 1.53 (H) 05/13/2017 1158   CREATININE 1.76 (H) 10/23/2013 0514   CALCIUM 9.6 05/13/2017 1158   CALCIUM 9.0 10/23/2013 0514   GFRNONAA 51 (L) 05/13/2017 1158   GFRNONAA 44 (L) 10/23/2013 0514   GFRAA 59 (L) 05/13/2017 1158   GFRAA 51 (L) 10/23/2013 0514    A/P: Hypertension longstanding currently *** on current medications.  {Meds adjust:18428} ***.   Results reviewed and written information provided.   Total time in face-to-face counseling *** minutes.   F/U Clinic Visit with Dr. [name]  Patient seen with Reino Bellis, PharmD Candidate

## 2017-07-28 ENCOUNTER — Ambulatory Visit: Payer: Self-pay | Attending: Family Medicine | Admitting: Family Medicine

## 2017-07-28 ENCOUNTER — Encounter: Payer: Self-pay | Admitting: Family Medicine

## 2017-07-28 VITALS — BP 199/113 | HR 68 | Temp 98.6°F | Resp 16 | Ht 73.0 in | Wt 271.6 lb

## 2017-07-28 DIAGNOSIS — J4541 Moderate persistent asthma with (acute) exacerbation: Secondary | ICD-10-CM

## 2017-07-28 DIAGNOSIS — E782 Mixed hyperlipidemia: Secondary | ICD-10-CM | POA: Insufficient documentation

## 2017-07-28 DIAGNOSIS — R062 Wheezing: Secondary | ICD-10-CM

## 2017-07-28 DIAGNOSIS — I10 Essential (primary) hypertension: Secondary | ICD-10-CM

## 2017-07-28 DIAGNOSIS — Z79899 Other long term (current) drug therapy: Secondary | ICD-10-CM | POA: Insufficient documentation

## 2017-07-28 DIAGNOSIS — Z760 Encounter for issue of repeat prescription: Secondary | ICD-10-CM

## 2017-07-28 DIAGNOSIS — M25551 Pain in right hip: Secondary | ICD-10-CM

## 2017-07-28 DIAGNOSIS — M5441 Lumbago with sciatica, right side: Secondary | ICD-10-CM

## 2017-07-28 DIAGNOSIS — G8929 Other chronic pain: Secondary | ICD-10-CM

## 2017-07-28 MED ORDER — FLUTICASONE PROPIONATE 50 MCG/ACT NA SUSP: 2.0000 | Freq: Every day | NASAL | 3 refills | Status: DC | PRN

## 2017-07-28 MED ORDER — ATORVASTATIN CALCIUM 40 MG PO TABS
40.0000 mg | ORAL_TABLET | Freq: Every day | ORAL | 2 refills | Status: DC
Start: 2017-07-28 — End: 2018-04-21

## 2017-07-28 MED ORDER — AMLODIPINE BESYLATE 10 MG PO TABS
10.0000 mg | ORAL_TABLET | Freq: Every day | ORAL | 2 refills | Status: DC
Start: 2017-07-28 — End: 2018-04-21

## 2017-07-28 MED ORDER — ALBUTEROL SULFATE HFA 108 (90 BASE) MCG/ACT IN AERS: 2.0000 | INHALATION_SPRAY | Freq: Four times a day (QID) | RESPIRATORY_TRACT | 2 refills | Status: DC | PRN

## 2017-07-28 MED ORDER — FLUTICASONE-SALMETEROL 100-50 MCG/DOSE IN AEPB: 1.0000 | INHALATION_SPRAY | Freq: Two times a day (BID) | RESPIRATORY_TRACT | 2 refills | Status: DC

## 2017-07-28 MED ORDER — LOSARTAN POTASSIUM 50 MG PO TABS: 50.0000 mg | ORAL_TABLET | Freq: Every day | ORAL | 2 refills | Status: DC

## 2017-07-28 MED ORDER — CARVEDILOL 25 MG PO TABS
25.0000 mg | ORAL_TABLET | Freq: Two times a day (BID) | ORAL | 2 refills | Status: DC
Start: 2017-07-28 — End: 2018-04-21

## 2017-07-28 MED ORDER — CYCLOBENZAPRINE HCL 10 MG PO TABS: 10.0000 mg | ORAL_TABLET | Freq: Two times a day (BID) | ORAL | 1 refills | Status: DC | PRN

## 2017-07-28 MED ORDER — AZITHROMYCIN 250 MG PO TABS: ORAL_TABLET | ORAL | 0 refills | Status: DC

## 2017-07-28 MED ORDER — MONTELUKAST SODIUM 10 MG PO TABS: 10.0000 mg | ORAL_TABLET | Freq: Every day | ORAL | 11 refills | Status: DC

## 2017-07-28 MED ORDER — DICLOFENAC SODIUM 75 MG PO TBEC: DELAYED_RELEASE_TABLET | ORAL | 1 refills | Status: DC

## 2017-07-28 MED ORDER — IPRATROPIUM-ALBUTEROL 0.5-2.5 (3) MG/3ML IN SOLN
3.0000 mL | RESPIRATORY_TRACT | Status: DC | PRN
  Administered 2017-07-28: 3 mL via RESPIRATORY_TRACT

## 2017-07-28 MED ORDER — CLONIDINE HCL 0.1 MG PO TABS
0.1000 mg | ORAL_TABLET | Freq: Once | ORAL | Status: AC
  Administered 2017-07-28: 0.1 mg via ORAL

## 2017-07-28 MED FILL — !ADVAIR 100/50 DISKUS: 100-50 | 30 days supply | Qty: 60 | Fill #0

## 2017-07-28 MED FILL — FLUTICASONE PROP 50 MCG SPR: 50 | 30 days supply | Qty: 16 | Fill #0

## 2017-07-28 MED FILL — AMLODIPINE BESYLATE 10 MG T: 10 | 30 days supply | Qty: 30 | Fill #0

## 2017-07-28 MED FILL — LOSARTAN POTASSIUM 50 MG TA: 50 | 30 days supply | Qty: 30 | Fill #0

## 2017-07-28 MED FILL — ?CARVEDILOL 25 MG TABLET: 25 | 30 days supply | Qty: 60 | Fill #0

## 2017-07-28 MED FILL — ?ATORVASTATIN 40MG TAB: 40 | 30 days supply | Qty: 30 | Fill #0

## 2017-07-28 MED FILL — AZITHROMYCIN 250 MG TABLET: 250 | 5 days supply | Qty: 6 | Fill #0

## 2017-07-28 MED FILL — ?MONTELUKAST SOD 10 MG TAB: 10 | 30 days supply | Qty: 30 | Fill #0

## 2017-07-28 MED FILL — ?CYCLOBENZAPRINE 10MG TB: 10 | 20 days supply | Qty: 40 | Fill #0

## 2017-07-28 MED FILL — ?DICLOFENAC SOD DR 75 MG TA: 75 | 20 days supply | Qty: 40 | Fill #0

## 2017-07-28 MED FILL — !VENTOLIN HFA INHALER: 108 (90 BAS | 25 days supply | Qty: 18 | Fill #0

## 2017-07-28 NOTE — Progress Notes (Signed)
Subjective:  Patient ID: Chris [name], male    DOB: 11/13/1960  Age: 57 y.o. MRN: [id number]  CC: Hypertension; Back Pain; and Hip Pain   HPI  Chris [name] presents for   Hypertension  Disease Monitoring  Blood pressure range: Patient does not check BP. He reports not picking up prescribed medications due to medication cost. He is requesting to speak with LCSW regarding resources.    Chest pain: no   Dyspnea: no   Claudication: no   Medication compliance: no .  Medication Side Effects  Lightheadedness: no   Urinary frequency: no   Edema: no   Impotence: no   Preventitive Healthcare:  Exercise: no   Diet Pattern: regular  Salt Restriction: no  Hip pain   Chronic,on-going. Difficulty bearing weight with standing. He reports having to lay on his right side, applying pressure to ease hip pain. Previous referral to orthopedics. He has not applied for orange card yet.   Chronic back pain with sciatica  Previous work up includes xray. Xray showed the following: multilevel DDD and lumbar spondylosis;  chronic compression fractures of T11 and L4, as above. Previous referral to orthopedics. He has not applied for orange card yet.   Asthma with URI symptoms  Onset about 1 month ago. Symptoms include wheezing, SOB. Associated symptom rhinorrhea, nasal congestion, cough, and sneezing. He denies any fevers, fatigue, or hemoptysis.    Outpatient Medications Prior to Visit  Medication Sig Dispense Refill  . albuterol (PROVENTIL HFA;VENTOLIN HFA) 108 (90 Base) MCG/ACT inhaler Inhale 2 puffs into the lungs every 6 (six) hours as needed for wheezing or shortness of breath. (Patient not taking: Reported on 05/27/2017) 18 g 2  . amLODipine (NORVASC) 10 MG tablet Take 1 tablet (10 mg total) by mouth daily. (Patient not taking: Reported on 07/28/2017) 30 tablet 2  . atorvastatin (LIPITOR) 40 MG tablet Take 1 tablet (40 mg total) by mouth daily at 6 PM. (Patient not taking: Reported on  07/28/2017) 30 tablet 2  . carvedilol (COREG) 25 MG tablet Take 1 tablet (25 mg total) by mouth 2 (two) times daily with a meal. (Patient not taking: Reported on 07/28/2017) 60 tablet 2  . cyclobenzaprine (FLEXERIL) 10 MG tablet Take 1 tablet (10 mg total) by mouth 2 (two) times daily as needed for muscle spasms. (Patient not taking: Reported on 07/28/2017) 40 tablet 1  . diclofenac (VOLTAREN) 75 MG EC tablet TAKE ONE TABLET BY MOUTH TWICE A DAY AS NEED FOR PAIN. TAKE WITH MEALS. (Patient not taking: Reported on 07/28/2017) 40 tablet 1  . fluticasone (FLONASE) 50 MCG/ACT nasal spray Place 2 sprays into both nostrils daily as needed for allergies or rhinitis. (Patient not taking: Reported on 07/28/2017) 16 g 3  . Fluticasone-Salmeterol (ADVAIR DISKUS) 100-50 MCG/DOSE AEPB Inhale 1 puff into the lungs 2 (two) times daily. (Patient not taking: Reported on 05/27/2017) 60 each 2  . loratadine (CLARITIN) 10 MG tablet Take 1 tablet (10 mg total) by mouth daily as needed for allergies or rhinitis. (Patient not taking: Reported on 07/28/2017) 30 tablet 6  . losartan (COZAAR) 50 MG tablet Take 1 tablet (50 mg total) by mouth daily. (Patient not taking: Reported on 07/28/2017) 30 tablet 2  . montelukast (SINGULAIR) 10 MG tablet Take 1 tablet (10 mg total) by mouth at bedtime. (Patient not taking: Reported on 07/28/2017) 30 tablet 11  . predniSONE (DELTASONE) 10 MG tablet DAY 1: TAKE 6 TABLETS BY MOUTH WITH MEAL; DAY 2: TAKE  5 TABLETS; DAY 3: TAKE 4 TABLETS; DAY 4: TAKE 3 TABLETS; DAY 5: TAKE 2 TABLETS; DAY 6: TAKE 1 TABLET. (Patient not taking: Reported on 07/28/2017) 21 tablet 0  . traMADol (ULTRAM) 50 MG tablet Take 1 tablet (50 mg total) by mouth every 8 (eight) hours as needed for severe pain. (Patient not taking: Reported on 07/28/2017) 40 tablet 0  . traZODone (DESYREL) 50 MG tablet Take 0.5-1 tablets (25-50 mg total) by mouth at bedtime as needed for sleep. (Patient not taking: Reported on 07/28/2017) 30 tablet 0    Facility-Administered Medications Prior to Visit  Medication Dose Route Frequency Provider Last Rate Last Dose  . albuterol (PROVENTIL) (2.5 MG/3ML) 0.083% nebulizer solution 2.5 mg  2.5 mg Nebulization Q20 Min PRN Fredia Beets R, FNP   2.5 mg at 05/13/17 1015    ROS Review of Systems  Constitutional: Negative.   HENT: Positive for congestion, rhinorrhea and sneezing.   Respiratory: Positive for cough and wheezing.   Cardiovascular: Negative.   Musculoskeletal: Positive for arthralgias (chronic) and back pain (chronic).  Skin: Negative.   Psychiatric/Behavioral: Negative for suicidal ideas.   Objective:  BP (!) 191/109 (BP Location: Right Arm, Patient Position: Sitting, Cuff Size: Large)   Pulse 68   Temp 98.6 F (37 C) (Oral)   Resp 16   Ht 6\' 1"  (1.854 m)   Wt 271 lb 9.6 oz (123.2 kg)   SpO2 98%   BMI 35.83 kg/m   BP/Weight 07/28/2017 07/05/2017 4/40/1027  Systolic BP 253 664 403  Diastolic BP 474 85 82  Wt. (Lbs) 271.6 277 -  BMI 35.83 37.57 -  Some encounter information is confidential and restricted. Go to Review Flowsheets activity to see all data.    Physical Exam  Nursing note and vitals reviewed. Constitutional: He appears well-developed and well-nourished.  HENT:  Head: Normocephalic and atraumatic.  Right Ear: External ear normal.  Left Ear: External ear normal.  Nose: Mucosal edema and rhinorrhea present.  Mouth/Throat: Oropharynx is clear and moist.  Eyes: Conjunctivae are normal. Pupils are equal, round, and reactive to light.  Neck: Normal range of motion. Neck supple. No JVD present.  Cardiovascular: Normal rate, regular rhythm, normal heart sounds and intact distal pulses.  Respiratory: Effort normal. He has wheezes.  GI: Soft. Bowel sounds are normal. There is no tenderness.  Musculoskeletal:       Right hip: He exhibits decreased range of motion and tenderness.  Lymphadenopathy:    He has no cervical adenopathy.  Skin: Skin is warm and  dry.  Psychiatric: He expresses no homicidal and no suicidal ideation. He expresses no suicidal plans and no homicidal plans. He is communicative. He is attentive.     Assessment & Plan:   1. Uncontrolled hypertension D/t patient not picking up medications. LCSW spoke with patient a provided resources. He will see financial counselor today regarding orange card and discount programs. Medications to [facility] pharmacy. Schedule BP check with clinical pharmacist in 2 weeks. Follow up with PCP in 3 months. - cloNIDine (CATAPRES) tablet 0.1 mg - amLODipine (NORVASC) 10 MG tablet; Take 1 tablet (10 mg total) by mouth daily.  Dispense: 30 tablet; Refill: 2 - carvedilol (COREG) 25 MG tablet; Take 1 tablet (25 mg total) by mouth 2 (two) times daily with a meal.  Dispense: 60 tablet; Refill: 2 - losartan (COZAAR) 50 MG tablet; Take 1 tablet (50 mg total) by mouth daily.  Dispense: 30 tablet; Refill: 2  2. Chronic bilateral low  back pain with right-sided sciatica  - diclofenac (VOLTAREN) 75 MG EC tablet; TAKE ONE TABLET BY MOUTH TWICE A DAY AS NEED FOR PAIN. TAKE WITH MEALS.  Dispense: 40 tablet; Refill: 1 - cyclobenzaprine (FLEXERIL) 10 MG tablet; Take 1 tablet (10 mg total) by mouth 2 (two) times daily as needed for muscle spasms.  Dispense: 40 tablet; Refill: 1  3. Chronic right hip pain  - diclofenac (VOLTAREN) 75 MG EC tablet; TAKE ONE TABLET BY MOUTH TWICE A DAY AS NEED FOR PAIN. TAKE WITH MEALS.  Dispense: 40 tablet; Refill: 1 - cyclobenzaprine (FLEXERIL) 10 MG tablet; Take 1 tablet (10 mg total) by mouth 2 (two) times daily as needed for muscle spasms.  Dispense: 40 tablet; Refill: 1  4. Wheezing  - ipratropium-albuterol (DUONEB) 0.5-2.5 (3) MG/3ML nebulizer solution 3 mL  5. Moderate persistent asthma with acute exacerbation Follow up if symptoms improved. - albuterol (PROVENTIL HFA;VENTOLIN HFA) 108 (90 Base) MCG/ACT inhaler; Inhale 2 puffs into the lungs every 6 (six) hours as needed for  wheezing or shortness of breath.  Dispense: 18 g; Refill: 2 - Fluticasone-Salmeterol (ADVAIR DISKUS) 100-50 MCG/DOSE AEPB; Inhale 1 puff into the lungs 2 (two) times daily.  Dispense: 60 each; Refill: 2 - montelukast (SINGULAIR) 10 MG tablet; Take 1 tablet (10 mg total) by mouth at bedtime.  Dispense: 30 tablet; Refill: 11 - azithromycin (ZITHROMAX) 250 MG tablet; DAY ONE TAKE TWO TABLETS BY MOUTH. THEN TAKE ONE TABLET BY MOUTH DAILY.  Dispense: 6 tablet; Refill: 0  6. Medication refill  - atorvastatin (LIPITOR) 40 MG tablet; Take 1 tablet (40 mg total) by mouth daily at 6 PM.  Dispense: 30 tablet; Refill: 2 - fluticasone (FLONASE) 50 MCG/ACT nasal spray; Place 2 sprays into both nostrils daily as needed for allergies or rhinitis.  Dispense: 16 g; Refill: 3    Follow-up: Return in about 2 weeks (around 08/11/2017) for BP with Stacy.   Alfonse Spruce FNP

## 2017-07-28 NOTE — Patient Instructions (Addendum)
Follow up with financial assistance appointment today.  Apply for orange card, blue card, and discount.    Managing Your Hypertension Hypertension is commonly called high blood pressure. This is when the force of your blood pressing against the walls of your arteries is too strong. Arteries are blood vessels that carry blood from your heart throughout your body. Hypertension forces the heart to work harder to pump blood, and may cause the arteries to become narrow or stiff. Having untreated or uncontrolled hypertension can cause heart attack, stroke, kidney disease, and other problems. What are blood pressure readings? A blood pressure reading consists of a higher number over a lower number. Ideally, your blood pressure should be below 120/80. The first ("top") number is called the systolic pressure. It is a measure of the pressure in your arteries as your heart beats. The second ("bottom") number is called the diastolic pressure. It is a measure of the pressure in your arteries as the heart relaxes. What does my blood pressure reading mean? Blood pressure is classified into four stages. Based on your blood pressure reading, your health care provider may use the following stages to determine what type of treatment you need, if any. Systolic pressure and diastolic pressure are measured in a unit called mm Hg. Normal  Systolic pressure: below 299.  Diastolic pressure: below 80. Elevated  Systolic pressure: 371-696.  Diastolic pressure: below 80. Hypertension stage 1  Systolic pressure: 789-381.  Diastolic pressure: 01-75. Hypertension stage 2  Systolic pressure: 102 or above.  Diastolic pressure: 90 or above. What health risks are associated with hypertension? Managing your hypertension is an important responsibility. Uncontrolled hypertension can lead to:  A heart attack.  A stroke.  A weakened blood vessel (aneurysm).  Heart failure.  Kidney damage.  Eye  damage.  Metabolic syndrome.  Memory and concentration problems.  What changes can I make to manage my hypertension? Hypertension can be managed by making lifestyle changes and possibly by taking medicines. Your health care provider will help you make a plan to bring your blood pressure within a normal range. Eating and drinking  Eat a diet that is high in fiber and potassium, and low in salt (sodium), added sugar, and fat. An example eating plan is called the DASH (Dietary Approaches to Stop Hypertension) diet. To eat this way: ? Eat plenty of fresh fruits and vegetables. Try to fill half of your plate at each meal with fruits and vegetables. ? Eat whole grains, such as whole wheat pasta, brown rice, or whole grain bread. Fill about one quarter of your plate with whole grains. ? Eat low-fat diary products. ? Avoid fatty cuts of meat, processed or cured meats, and poultry with skin. Fill about one quarter of your plate with lean proteins such as fish, chicken without skin, beans, eggs, and tofu. ? Avoid premade and processed foods. These tend to be higher in sodium, added sugar, and fat.  Reduce your daily sodium intake. Most people with hypertension should eat less than 1,500 mg of sodium a day.  Limit alcohol intake to no more than 1 drink a day for nonpregnant women and 2 drinks a day for men. One drink equals 12 oz of beer, 5 oz of wine, or 1 oz of hard liquor. Lifestyle  Work with your health care provider to maintain a healthy body weight, or to lose weight. Ask what an ideal weight is for you.  Get at least 30 minutes of exercise that causes your heart to  beat faster (aerobic exercise) most days of the week. Activities may include walking, swimming, or biking.  Include exercise to strengthen your muscles (resistance exercise), such as weight lifting, as part of your weekly exercise routine. Try to do these types of exercises for 30 minutes at least 3 days a week.  Do not use any  products that contain nicotine or tobacco, such as cigarettes and e-cigarettes. If you need help quitting, ask your health care provider.  Control any long-term (chronic) conditions you have, such as high cholesterol or diabetes. Monitoring  Monitor your blood pressure at home as told by your health care provider. Your personal target blood pressure may vary depending on your medical conditions, your age, and other factors.  Have your blood pressure checked regularly, as often as told by your health care provider. Working with your health care provider  Review all the medicines you take with your health care provider because there may be side effects or interactions.  Talk with your health care provider about your diet, exercise habits, and other lifestyle factors that may be contributing to hypertension.  Visit your health care provider regularly. Your health care provider can help you create and adjust your plan for managing hypertension. Will I need medicine to control my blood pressure? Your health care provider may prescribe medicine if lifestyle changes are not enough to get your blood pressure under control, and if:  Your systolic blood pressure is 130 or higher.  Your diastolic blood pressure is 80 or higher.  Take medicines only as told by your health care provider. Follow the directions carefully. Blood pressure medicines must be taken as prescribed. The medicine does not work as well when you skip doses. Skipping doses also puts you at risk for problems. Contact a health care provider if:  You think you are having a reaction to medicines you have taken.  You have repeated (recurrent) headaches.  You feel dizzy.  You have swelling in your ankles.  You have trouble with your vision. Get help right away if:  You develop a severe headache or confusion.  You have unusual weakness or numbness, or you feel faint.  You have severe pain in your chest or abdomen.  You vomit  repeatedly.  You have trouble breathing. Summary  Hypertension is when the force of blood pumping through your arteries is too strong. If this condition is not controlled, it may put you at risk for serious complications.  Your personal target blood pressure may vary depending on your medical conditions, your age, and other factors. For most people, a normal blood pressure is less than 120/80.  Hypertension is managed by lifestyle changes, medicines, or both. Lifestyle changes include weight loss, eating a healthy, low-sodium diet, exercising more, and limiting alcohol. This information is not intended to replace advice given to you by your health care provider. Make sure you discuss any questions you have with your health care provider. Document Released: 02/23/2012 Document Revised: 04/28/2016 Document Reviewed: 04/28/2016 Elsevier Interactive Patient Education  [employer].

## 2017-07-28 NOTE — Progress Notes (Signed)
Patient is here for hypertension and is not taking any medication due not being able to purchase it.   Patient stated he have his usual back pain and right hip pain.

## 2017-08-01 ENCOUNTER — Telehealth: Payer: Self-pay | Admitting: Family Medicine

## 2017-08-01 NOTE — Telephone Encounter (Signed)
Pt called to request for the financial dept to call him back about his paper, please follow up

## 2017-08-11 ENCOUNTER — Ambulatory Visit: Payer: Self-pay | Attending: Internal Medicine | Admitting: Pharmacist

## 2017-08-11 ENCOUNTER — Encounter: Payer: Self-pay | Admitting: Pharmacist

## 2017-08-11 VITALS — BP 123/77 | HR 70

## 2017-08-11 DIAGNOSIS — I10 Essential (primary) hypertension: Secondary | ICD-10-CM | POA: Insufficient documentation

## 2017-08-11 NOTE — Patient Instructions (Addendum)
Thanks for coming to see [place]  Blood pressure is great!  Schedule appt with new PCP in April

## 2017-08-11 NOTE — Progress Notes (Signed)
   S:    Patient arrives in good spirits.  Presents to the clinic for hypertension evaluation.   Patient reports adherence with medications.  Current BP Medications include:  Amlodipine 10 mg daily, carvedilol 25 mg BID, losartan 50 mg daily   O:   Last 3 Office BP readings: BP Readings from Last 3 Encounters:  08/11/17 123/77  07/28/17 (!) 199/113  07/05/17 (!) 150/85    BMET    Component Value Date/Time   NA 139 05/13/2017 1158   NA 137 10/23/2013 0514   K 4.7 05/13/2017 1158   K 4.9 10/23/2013 0514   CL 103 05/13/2017 1158   CL 104 10/23/2013 0514   CO2 23 05/13/2017 1158   CO2 27 10/23/2013 0514   GLUCOSE 102 (H) 05/13/2017 1158   GLUCOSE 99 03/19/2014 0029   GLUCOSE 115 (H) 10/23/2013 0514   BUN 19 05/13/2017 1158   BUN 39 (H) 10/23/2013 0514   CREATININE 1.53 (H) 05/13/2017 1158   CREATININE 1.76 (H) 10/23/2013 0514   CALCIUM 9.6 05/13/2017 1158   CALCIUM 9.0 10/23/2013 0514   GFRNONAA 51 (L) 05/13/2017 1158   GFRNONAA 44 (L) 10/23/2013 0514   GFRAA 59 (L) 05/13/2017 1158   GFRAA 51 (L) 10/23/2013 0514    A/P: Hypertension longstanding currently controlled on current medications.  Continued current medications.   Results reviewed and written information provided.   Total time in face-to-face counseling 5 minutes.   F/U Clinic Visit with PCP to establish care.  Patient seen with Reino Bellis, PharmD Candidate

## 2017-09-08 MED FILL — CYCLOBENZAPRINE 10 MG TAB: 10 | 20 days supply | Qty: 40 | Fill #1

## 2017-09-08 MED FILL — ?MONTELUKAST SOD 10 MG TAB: 10 | 30 days supply | Qty: 30 | Fill #1

## 2017-09-08 MED FILL — ?CARVEDILOL 25 MG TABLET: 25 | 30 days supply | Qty: 60 | Fill #1

## 2017-09-08 MED FILL — AMLODIPINE BESYLATE 10 MG T: 10 | 30 days supply | Qty: 30 | Fill #1

## 2017-09-08 MED FILL — !VENTOLIN HFA INHALER: 108 (90 BAS | 25 days supply | Qty: 18 | Fill #1

## 2017-09-08 MED FILL — LOSARTAN POTASSIUM 50 MG TA: 50 | 30 days supply | Qty: 30 | Fill #1

## 2017-09-08 MED FILL — ?ATORVASTATIN 40MG TAB: 40 | 30 days supply | Qty: 30 | Fill #1

## 2017-09-08 MED FILL — !ADVAIR 100/50 DISKUS: 100-50 | 30 days supply | Qty: 60 | Fill #1

## 2017-09-08 MED FILL — FLUTICASONE PROP 50 MCG SPR: 50 | 30 days supply | Qty: 16 | Fill #1

## 2017-09-08 MED FILL — ?DICLOFENAC SOD DR 75 MG TA: 75 | 20 days supply | Qty: 40 | Fill #1

## 2017-10-03 ENCOUNTER — Ambulatory Visit: Payer: Self-pay | Admitting: Internal Medicine

## 2017-10-04 ENCOUNTER — Telehealth: Payer: Self-pay | Admitting: Family Medicine

## 2017-10-04 NOTE — Telephone Encounter (Signed)
2 page, paperwork received through fax 10-04-17.

## 2017-10-19 ENCOUNTER — Ambulatory Visit: Payer: Self-pay | Admitting: Internal Medicine

## 2017-10-24 MED FILL — LOSARTAN POTASSIUM 50 MG TA: 50 | 30 days supply | Qty: 30 | Fill #2

## 2017-10-24 MED FILL — AMLODIPINE BESYLATE 10 MG T: 10 | 30 days supply | Qty: 30 | Fill #2

## 2017-10-24 MED FILL — MONTELUKAST SOD 10 MG TAB: 10 | 30 days supply | Qty: 30 | Fill #2

## 2017-10-24 MED FILL — ?CARVEDILOL 25 MG TABLET: 25 | 30 days supply | Qty: 60 | Fill #2

## 2017-10-24 MED FILL — ?ATORVASTATIN 40MG TABLET: 40 | 30 days supply | Qty: 30 | Fill #2

## 2017-10-24 MED FILL — ?SILDENAFIL 20 MG TABLET: 20 | 30 days supply | Qty: 90 | Fill #0

## 2017-10-24 MED FILL — !ADVAIR 100/50 DISKUS: 100-50 | 30 days supply | Qty: 60 | Fill #2

## 2017-10-24 MED FILL — !VENTOLIN HFA INHALER: 108 (90 BAS | 25 days supply | Qty: 18 | Fill #2

## 2017-11-22 ENCOUNTER — Ambulatory Visit: Payer: Self-pay | Admitting: Internal Medicine

## 2017-11-24 MED FILL — ?SILDENAFIL 20 MG TABLET: 20 | 30 days supply | Qty: 90 | Fill #1

## 2017-11-29 ENCOUNTER — Ambulatory Visit: Payer: Self-pay | Admitting: Nurse Practitioner

## 2017-12-23 MED FILL — SILDENAFIL CITRATE 20 MG TA: 20 | 30 days supply | Qty: 90 | Fill #2

## 2018-03-29 ENCOUNTER — Other Ambulatory Visit: Payer: Self-pay

## 2018-03-30 ENCOUNTER — Ambulatory Visit: Payer: Self-pay | Admitting: Family Medicine

## 2018-04-03 ENCOUNTER — Ambulatory Visit: Payer: Self-pay | Admitting: Family Medicine

## 2018-04-21 ENCOUNTER — Encounter: Payer: Self-pay | Admitting: Internal Medicine

## 2018-04-21 ENCOUNTER — Ambulatory Visit: Payer: Self-pay | Attending: Internal Medicine | Admitting: Internal Medicine

## 2018-04-21 VITALS — BP 174/84 | HR 81 | Temp 98.8°F | Resp 16

## 2018-04-21 DIAGNOSIS — Z8249 Family history of ischemic heart disease and other diseases of the circulatory system: Secondary | ICD-10-CM | POA: Insufficient documentation

## 2018-04-21 DIAGNOSIS — I160 Hypertensive urgency: Secondary | ICD-10-CM | POA: Insufficient documentation

## 2018-04-21 DIAGNOSIS — R040 Epistaxis: Secondary | ICD-10-CM | POA: Insufficient documentation

## 2018-04-21 DIAGNOSIS — E785 Hyperlipidemia, unspecified: Secondary | ICD-10-CM

## 2018-04-21 DIAGNOSIS — Z2821 Immunization not carried out because of patient refusal: Secondary | ICD-10-CM

## 2018-04-21 DIAGNOSIS — E782 Mixed hyperlipidemia: Secondary | ICD-10-CM | POA: Insufficient documentation

## 2018-04-21 DIAGNOSIS — F17210 Nicotine dependence, cigarettes, uncomplicated: Secondary | ICD-10-CM | POA: Insufficient documentation

## 2018-04-21 DIAGNOSIS — I5022 Chronic systolic (congestive) heart failure: Secondary | ICD-10-CM | POA: Insufficient documentation

## 2018-04-21 DIAGNOSIS — I10 Essential (primary) hypertension: Secondary | ICD-10-CM

## 2018-04-21 DIAGNOSIS — I110 Hypertensive heart disease with heart failure: Secondary | ICD-10-CM | POA: Insufficient documentation

## 2018-04-21 MED ORDER — ATORVASTATIN CALCIUM 40 MG PO TABS
40.0000 mg | ORAL_TABLET | Freq: Every day | ORAL | 2 refills | Status: DC
Start: 2018-04-21 — End: 2018-11-30

## 2018-04-21 MED ORDER — CLONIDINE HCL 0.1 MG PO TABS: 0.1000 mg | ORAL_TABLET | Freq: Once | ORAL | Status: DC

## 2018-04-21 MED ORDER — ALBUTEROL SULFATE HFA 108 (90 BASE) MCG/ACT IN AERS: 2.0000 | INHALATION_SPRAY | Freq: Four times a day (QID) | RESPIRATORY_TRACT | 2 refills | Status: DC | PRN

## 2018-04-21 MED ORDER — AMLODIPINE BESYLATE 10 MG PO TABS
10.0000 mg | ORAL_TABLET | Freq: Every day | ORAL | 2 refills | Status: DC
Start: 2018-04-21 — End: 2018-09-27

## 2018-04-21 MED ORDER — CARVEDILOL 25 MG PO TABS
25.0000 mg | ORAL_TABLET | Freq: Two times a day (BID) | ORAL | 2 refills | Status: DC
Start: 2018-04-21 — End: 2018-09-27

## 2018-04-21 MED ORDER — LOSARTAN POTASSIUM 50 MG PO TABS: 50.0000 mg | ORAL_TABLET | Freq: Every day | ORAL | 2 refills | Status: DC

## 2018-04-21 MED ORDER — CLONIDINE HCL 0.1 MG PO TABS
0.1000 mg | ORAL_TABLET | Freq: Once | ORAL | Status: AC
Start: 2018-04-21 — End: 2018-04-21
  Administered 2018-04-21: 0.1 mg via ORAL

## 2018-04-21 MED ORDER — SALINE SPRAY 0.65 % NA SOLN
1.0000 | NASAL | 1 refills | Status: DC | PRN
Start: 2018-04-21 — End: 2018-11-30

## 2018-04-21 MED FILL — !VENTOLIN HFA INHALER: 108 (90 BAS | 25 days supply | Qty: 18 | Fill #0

## 2018-04-21 MED FILL — SILDENAFIL CITRATE 20 MG TA: 20 | 30 days supply | Qty: 90 | Fill #3

## 2018-04-21 MED FILL — ATORVASTATIN CALCIUM 40 MG: 40 | 30 days supply | Qty: 30 | Fill #0

## 2018-04-21 MED FILL — CARVEDILOL 25 MG TABLET: 25 | 30 days supply | Qty: 60 | Fill #0

## 2018-04-21 MED FILL — AMLODIPINE BESYLATE 10 MG T: 10 | 30 days supply | Qty: 30 | Fill #0

## 2018-04-21 MED FILL — LOSARTAN POTASSIUM 50 MG TA: 50 | 30 days supply | Qty: 30 | Fill #0

## 2018-04-21 NOTE — Patient Instructions (Signed)
Please schedule a 1 week bp check with [name]

## 2018-04-21 NOTE — Progress Notes (Signed)
Pt states he has been out of his bp medicine for a month  Pt states he has been out of work for 3 weeks   Pt states he is having an headache

## 2018-04-21 NOTE — Progress Notes (Signed)
Patient ID: Chris [name], male    DOB: Feb 19, 1961  MRN: [id number]  CC: re-establish and Hypertension   Subjective: Chris [name] is a 57 y.o. male who presents for chronic ds management and to establish with me as PCP.  Previous PCP was NP Hairston who has left the practice.[name]  His concerns today include:  Pt with hx of HTN, HL, subst abuse, ? COPD, ? Hx of CHF systolic  HTN: Patient's main complaint today is intermittent headache since being out of his blood pressure medications for 1 month.  Patient states that he tried to get an appointment earlier but was unable to get in.  He is on amlodipine, carvedilol and Cozaar. No device to check BP Denies chest pains.  Endorses shortness of breath at times which he attributes to having COPD.  He has been having throbbing headaches at the top of the head about every other day ever since being out of his blood pressure medications.  No associated nausea/vomiting or blurred vision.  He has a headache today. He also reports 3 episodes of nosebleeds over the past month.  Last episode occurred yesterday evening.  He had bleeding from the right nostril but on one occasion both nostrils were bleeding.  Episodes last about 10 minutes.  He usually gets it to stop by pinching his nose and tilting his head back.  He denies picking the nose.  Flonase is on med list but he has not been using that.  Had nose bleed at work 3 weeks ago and he was sent home and told not to return until he has a doctor's note releasing him to return to work.  Tob dep:  Smoke 1pk/day for 20+ yrs "I want to quit but I need to get my mind right."  Stopped for 5 yrs in past. Patient Active Problem List   Diagnosis Date Noted  . Mixed hyperlipidemia 07/28/2017  . Essential hypertension 07/28/2017  . Chronic systolic heart failure ([place]) 10/25/2014  . Major depression, recurrent ([place]) 02/20/2013  . Cocaine abuse ([place]) 02/20/2013  . Cannabis abuse 02/20/2013  . Back pain, chronic  02/20/2013     Current Outpatient Medications on File Prior to Visit  Medication Sig Dispense Refill  . montelukast (SINGULAIR) 10 MG tablet Take 1 tablet (10 mg total) by mouth at bedtime. 30 tablet 11   No current facility-administered medications on file prior to visit.     No Known Allergies  Social History   Socioeconomic History  . Marital status: Single    Spouse name: Not on file  . Number of children: Not on file  . Years of education: Not on file  . Highest education level: Not on file  Occupational History  . Not on file  Social Needs  . Financial resource strain: Not on file  . Food insecurity:    Worry: Not on file    Inability: Not on file  . Transportation needs:    Medical: Not on file    Non-medical: Not on file  Tobacco Use  . Smoking status: Current Every Day Smoker    Packs/day: 1.00    Types: Cigarettes  . Smokeless tobacco: Never Used  . Tobacco comment: 3/4 of a pack  Substance and Sexual Activity  . Alcohol use: No  . Drug use: Yes    Frequency: 21.0 times per week    Types: Marijuana    Comment: no longer- Cocaine  . Sexual activity: Not on file  Lifestyle  .  Physical activity:    Days per week: Not on file    Minutes per session: Not on file  . Stress: Not on file  Relationships  . Social connections:    Talks on phone: Not on file    Gets together: Not on file    Attends religious service: Not on file    Active member of club or organization: Not on file    Attends meetings of clubs or organizations: Not on file    Relationship status: Not on file  . Intimate partner violence:    Fear of current or ex partner: Not on file    Emotionally abused: Not on file    Physically abused: Not on file    Forced sexual activity: Not on file  Other Topics Concern  . Not on file  Social History Narrative   ** Merged History Encounter **        Family History  Problem Relation Age of Onset  . Heart disease Father     Past Surgical  History:  Procedure Laterality Date  . ANKLE SURGERY    . CARDIAC CATHETERIZATION    . SHOULDER SURGERY      ROS: Review of Systems Negative except as stated above. PHYSICAL EXAM: BP (!) 174/84 (BP Location: Right Arm, Cuff Size: Large) Comment: recheck  Pulse 81   Temp 98.8 F (37.1 C) (Oral)   Resp 16   SpO2 97%   Physical Exam Repeat BP was 174/84 after receiving clonidine 0.1 mg x 2 General appearance - alert, well appearing, and in no distress Mental status - normal mood, behavior, speech, dress, motor activity, and thought processes Eyes -eyes are slightly protruding.  Pink conjunctiva. Nose -nasal mucosa is slightly dry.  No dried blood noted in the nostrils at this time Mouth - mucous membranes moist, pharynx normal without lesions Neck - supple, no significant adenopathy Chest -breath sounds are slightly decreased bilaterally without wheezes crackles or rhonchi Heart - normal rate, regular rhythm, normal S1, S2, no murmurs, rubs, clicks or gallops Extremities -no lower extremity edema. Neurologic exam -cranial nerves grossly intact.  Power 5/5 bilaterally in upper and lower extremities.  Gross sensation intact.  Gait is normal.  ASSESSMENT AND PLAN: 1. Essential hypertension 2. Hypertensive urgency Patient given clonidine 0.1 mg x 2 with subsequent decrease in blood pressure.  Refill given on his regular blood pressure medications.  DASH diet discussed and encouraged.  He will follow-up with clinical pharmacist in 1 week for repeat blood pressure check. - CBC - Lipid panel - Comprehensive metabolic panel - carvedilol (COREG) 25 MG tablet; Take 1 tablet (25 mg total) by mouth 2 (two) times daily with a meal.  Dispense: 60 tablet; Refill: 2 - amLODipine (NORVASC) 10 MG tablet; Take 1 tablet (10 mg total) by mouth daily.  Dispense: 30 tablet; Refill: 2 - losartan (COZAAR) 50 MG tablet; Take 1 tablet (50 mg total) by mouth daily.  Dispense: 30 tablet; Refill: 2 -  cloNIDine (CATAPRES) tablet 0.1 mg - cloNIDine (CATAPRES) tablet 0.1 mg  3. Hyperlipidemia, unspecified hyperlipidemia type - atorvastatin (LIPITOR) 40 MG tablet; Take 1 tablet (40 mg total) by mouth daily at 6 PM.  Dispense: 30 tablet; Refill: 2  4. Recurrent epistaxis Advised to avoid picking the nose Refer to ENT - PT AND PTT - sodium chloride (OCEAN) 0.65 % SOLN nasal spray; Place 1 spray into both nostrils as needed for congestion.  Dispense: 15 mL; Refill: 1  5. Influenza vaccination  declined  6.  Tobacco dependence Patient advised to quit smoking. Discussed health risks associated with smoking including lung and other types of cancers, chronic lung diseases and CV risks.. Pt not ready to give trail of quitting.      Patient was given the opportunity to ask questions.  Patient verbalized understanding of the plan and was able to repeat key elements of the plan.   Orders Placed This Encounter  Procedures  . CBC  . Lipid panel  . Comprehensive metabolic panel  . PT AND PTT     Requested Prescriptions   Signed Prescriptions Disp Refills  . carvedilol (COREG) 25 MG tablet 60 tablet 2    Sig: Take 1 tablet (25 mg total) by mouth 2 (two) times daily with a meal.  . amLODipine (NORVASC) 10 MG tablet 30 tablet 2    Sig: Take 1 tablet (10 mg total) by mouth daily.  [name] atorvastatin (LIPITOR) 40 MG tablet 30 tablet 2    Sig: Take 1 tablet (40 mg total) by mouth daily at 6 PM.  . losartan (COZAAR) 50 MG tablet 30 tablet 2    Sig: Take 1 tablet (50 mg total) by mouth daily.  [name] albuterol (PROVENTIL HFA;VENTOLIN HFA) 108 (90 Base) MCG/ACT inhaler 1 Inhaler 2    Sig: Inhale 2 puffs into the lungs every 6 (six) hours as needed for wheezing or shortness of breath.  . sodium chloride (OCEAN) 0.65 % SOLN nasal spray 15 mL 1    Sig: Place 1 spray into both nostrils as needed for congestion.    Return in about 3 months (around 07/22/2018).  Karle Plumber, MD, FACP

## 2018-04-22 LAB — COMPREHENSIVE METABOLIC PANEL
ALBUMIN: 3.8 g/dL (ref 3.5–5.5)
ALT: 10 IU/L (ref 0–44)
AST: 13 IU/L (ref 0–40)
Albumin/Globulin Ratio: 1.4 (ref 1.2–2.2)
Alkaline Phosphatase: 73 IU/L (ref 39–117)
BUN / CREAT RATIO: 13 (ref 9–20)
BUN: 27 mg/dL — AB (ref 6–24)
Bilirubin Total: <0.2 mg/dL (ref 0.0–1.2)
CO2: 26 mmol/L (ref 20–29)
CREATININE: 2.03 mg/dL — AB (ref 0.76–1.27)
Calcium: 9 mg/dL (ref 8.7–10.2)
Chloride: 101 mmol/L (ref 96–106)
GFR calc Af Amer: 41 mL/min/{1.73_m2} — ABNORMAL LOW (ref >59)
GFR calc non Af Amer: 36 mL/min/{1.73_m2} — ABNORMAL LOW (ref >59)
GLUCOSE: 80 mg/dL (ref 65–99)
Globulin, Total: 2.7 g/dL (ref 1.5–4.5)
Potassium: 4.6 mmol/L (ref 3.5–5.2)
Sodium: 139 mmol/L (ref 134–144)
TOTAL PROTEIN: 6.5 g/dL (ref 6.0–8.5)

## 2018-04-22 LAB — LIPID PANEL
Chol/HDL Ratio: 5.8 ratio — ABNORMAL HIGH (ref 0.0–5.0)
Cholesterol, Total: 184 mg/dL (ref 100–199)
HDL: 32 mg/dL — ABNORMAL LOW (ref >39)
LDL Calculated: 118 mg/dL — ABNORMAL HIGH (ref 0–99)
Triglycerides: 169 mg/dL — ABNORMAL HIGH (ref 0–149)
VLDL Cholesterol Cal: 34 mg/dL (ref 5–40)

## 2018-04-22 LAB — PT AND PTT
INR: 1 (ref 0.8–1.2)
Prothrombin Time: 10.8 s (ref 9.1–12.0)
aPTT: 32 s (ref 24–33)

## 2018-04-22 LAB — CBC
Hematocrit: 43.1 % (ref 37.5–51.0)
Hemoglobin: 14.6 g/dL (ref 13.0–17.7)
MCH: 30.2 pg (ref 26.6–33.0)
MCHC: 33.9 g/dL (ref 31.5–35.7)
MCV: 89 fL (ref 79–97)
PLATELETS: 230 10*3/uL (ref 150–450)
RBC: 4.84 x10E6/uL (ref 4.14–5.80)
RDW: 14.1 % (ref 12.3–15.4)
WBC: 11.8 10*3/uL — ABNORMAL HIGH (ref 3.4–10.8)

## 2018-04-27 ENCOUNTER — Encounter: Payer: Self-pay | Admitting: Pharmacist

## 2018-04-28 ENCOUNTER — Telehealth: Payer: Self-pay

## 2018-04-28 ENCOUNTER — Encounter: Payer: Self-pay | Admitting: Pharmacist

## 2018-04-28 NOTE — Telephone Encounter (Signed)
Contacted pt to go over lab results pt is aware of results and doesn't have any questions or concerns 

## 2018-05-01 ENCOUNTER — Encounter: Payer: Self-pay | Admitting: Pharmacist

## 2018-07-24 ENCOUNTER — Ambulatory Visit: Payer: Self-pay | Admitting: Family Medicine

## 2018-08-10 ENCOUNTER — Emergency Department (HOSPITAL_COMMUNITY): Payer: Medicaid Other

## 2018-08-10 ENCOUNTER — Other Ambulatory Visit: Payer: Self-pay

## 2018-08-10 ENCOUNTER — Encounter (HOSPITAL_COMMUNITY): Payer: Self-pay | Admitting: Emergency Medicine

## 2018-08-10 ENCOUNTER — Observation Stay (HOSPITAL_COMMUNITY)
Admission: EM | Admit: 2018-08-10 | Discharge: 2018-08-11 | Discharge status: Against Medical Advice | Payer: Medicaid Other | Attending: Internal Medicine | Admitting: Internal Medicine

## 2018-08-10 ENCOUNTER — Observation Stay (HOSPITAL_COMMUNITY): Payer: Medicaid Other

## 2018-08-10 DIAGNOSIS — I700 Atherosclerosis of aorta: Secondary | ICD-10-CM | POA: Insufficient documentation

## 2018-08-10 DIAGNOSIS — N183 Chronic kidney disease, stage 3 unspecified: Secondary | ICD-10-CM | POA: Diagnosis present

## 2018-08-10 DIAGNOSIS — R6510 Systemic inflammatory response syndrome (SIRS) of non-infectious origin without acute organ dysfunction: Secondary | ICD-10-CM | POA: Diagnosis present

## 2018-08-10 DIAGNOSIS — R911 Solitary pulmonary nodule: Secondary | ICD-10-CM | POA: Diagnosis not present

## 2018-08-10 DIAGNOSIS — R590 Localized enlarged lymph nodes: Secondary | ICD-10-CM | POA: Insufficient documentation

## 2018-08-10 DIAGNOSIS — A419 Sepsis, unspecified organism: Secondary | ICD-10-CM | POA: Diagnosis not present

## 2018-08-10 DIAGNOSIS — J101 Influenza due to other identified influenza virus with other respiratory manifestations: Secondary | ICD-10-CM | POA: Diagnosis present

## 2018-08-10 DIAGNOSIS — J9601 Acute respiratory failure with hypoxia: Secondary | ICD-10-CM | POA: Diagnosis not present

## 2018-08-10 DIAGNOSIS — J441 Chronic obstructive pulmonary disease with (acute) exacerbation: Secondary | ICD-10-CM | POA: Diagnosis not present

## 2018-08-10 DIAGNOSIS — F17210 Nicotine dependence, cigarettes, uncomplicated: Secondary | ICD-10-CM | POA: Insufficient documentation

## 2018-08-10 DIAGNOSIS — J1000 Influenza due to other identified influenza virus with unspecified type of pneumonia: Secondary | ICD-10-CM | POA: Insufficient documentation

## 2018-08-10 DIAGNOSIS — Z8249 Family history of ischemic heart disease and other diseases of the circulatory system: Secondary | ICD-10-CM | POA: Insufficient documentation

## 2018-08-10 DIAGNOSIS — F329 Major depressive disorder, single episode, unspecified: Secondary | ICD-10-CM | POA: Insufficient documentation

## 2018-08-10 DIAGNOSIS — I5022 Chronic systolic (congestive) heart failure: Secondary | ICD-10-CM | POA: Diagnosis not present

## 2018-08-10 DIAGNOSIS — J189 Pneumonia, unspecified organism: Secondary | ICD-10-CM | POA: Diagnosis present

## 2018-08-10 DIAGNOSIS — R0789 Other chest pain: Secondary | ICD-10-CM | POA: Diagnosis present

## 2018-08-10 DIAGNOSIS — M109 Gout, unspecified: Secondary | ICD-10-CM | POA: Diagnosis not present

## 2018-08-10 DIAGNOSIS — I130 Hypertensive heart and chronic kidney disease with heart failure and stage 1 through stage 4 chronic kidney disease, or unspecified chronic kidney disease: Secondary | ICD-10-CM | POA: Insufficient documentation

## 2018-08-10 DIAGNOSIS — Z79899 Other long term (current) drug therapy: Secondary | ICD-10-CM | POA: Insufficient documentation

## 2018-08-10 DIAGNOSIS — J09X2 Influenza due to identified novel influenza A virus with other respiratory manifestations: Secondary | ICD-10-CM

## 2018-08-10 DIAGNOSIS — J449 Chronic obstructive pulmonary disease, unspecified: Secondary | ICD-10-CM | POA: Diagnosis present

## 2018-08-10 DIAGNOSIS — R0602 Shortness of breath: Secondary | ICD-10-CM | POA: Diagnosis present

## 2018-08-10 HISTORY — DX: Influenza due to other identified influenza virus with other respiratory manifestations: J10.1

## 2018-08-10 LAB — URINALYSIS, ROUTINE W REFLEX MICROSCOPIC
Bilirubin Urine: NEGATIVE
Glucose, UA: NEGATIVE mg/dL
Ketones, ur: NEGATIVE mg/dL
Leukocytes,Ua: NEGATIVE
Nitrite: NEGATIVE
Protein, ur: 100 mg/dL — AB
Specific Gravity, Urine: 1.005 (ref 1.005–1.030)
pH: 6 (ref 5.0–8.0)

## 2018-08-10 LAB — BASIC METABOLIC PANEL
Anion gap: 9 (ref 5–15)
BUN: 16 mg/dL (ref 6–20)
CHLORIDE: 99 mmol/L (ref 98–111)
CO2: 25 mmol/L (ref 22–32)
CREATININE: 1.98 mg/dL — AB (ref 0.61–1.24)
Calcium: 8.7 mg/dL — ABNORMAL LOW (ref 8.9–10.3)
GFR calc non Af Amer: 37 mL/min — ABNORMAL LOW (ref >60)
GFR, EST AFRICAN AMERICAN: 43 mL/min — AB (ref >60)
Glucose, Bld: 106 mg/dL — ABNORMAL HIGH (ref 70–99)
Potassium: 4 mmol/L (ref 3.5–5.1)
Sodium: 133 mmol/L — ABNORMAL LOW (ref 135–145)

## 2018-08-10 LAB — INFLUENZA PANEL BY PCR (TYPE A & B)
INFLAPCR: POSITIVE — AB
INFLBPCR: NEGATIVE

## 2018-08-10 LAB — POCT I-STAT EG7
Bicarbonate: 25.2 mmol/L (ref 20.0–28.0)
Calcium, Ion: 1.17 mmol/L (ref 1.15–1.40)
HCT: 40 % (ref 39.0–52.0)
Hemoglobin: 13.6 g/dL (ref 13.0–17.0)
O2 Saturation: 78 %
POTASSIUM: 3.7 mmol/L (ref 3.5–5.1)
Sodium: 133 mmol/L — ABNORMAL LOW (ref 135–145)
TCO2: 26 mmol/L (ref 22–32)
pCO2, Ven: 40.9 mmHg — ABNORMAL LOW (ref 44.0–60.0)
pH, Ven: 7.399 (ref 7.250–7.430)
pO2, Ven: 43 mmHg (ref 32.0–45.0)

## 2018-08-10 LAB — CBC
HEMATOCRIT: 43 % (ref 39.0–52.0)
Hemoglobin: 14 g/dL (ref 13.0–17.0)
MCH: 30 pg (ref 26.0–34.0)
MCHC: 32.6 g/dL (ref 30.0–36.0)
MCV: 92.3 fL (ref 80.0–100.0)
NRBC: 0 % (ref 0.0–0.2)
Platelets: 147 10*3/uL — ABNORMAL LOW (ref 150–400)
RBC: 4.66 MIL/uL (ref 4.22–5.81)
RDW: 14.4 % (ref 11.5–15.5)
WBC: 9.2 10*3/uL (ref 4.0–10.5)

## 2018-08-10 LAB — PROCALCITONIN: Procalcitonin: 0.32 ng/mL

## 2018-08-10 LAB — I-STAT TROPONIN, ED: Troponin i, poc: 0.04 ng/mL (ref 0.00–0.08)

## 2018-08-10 LAB — BRAIN NATRIURETIC PEPTIDE: B Natriuretic Peptide: 592.5 pg/mL — ABNORMAL HIGH (ref 0.0–100.0)

## 2018-08-10 MED ORDER — PREDNISONE 20 MG PO TABS: 40.0000 mg | ORAL_TABLET | Freq: Every day | ORAL | Status: DC

## 2018-08-10 MED ORDER — IPRATROPIUM-ALBUTEROL 0.5-2.5 (3) MG/3ML IN SOLN
3.0000 mL | Freq: Four times a day (QID) | RESPIRATORY_TRACT | Status: DC
  Administered 2018-08-10 – 2018-08-11 (×2): 3 mL via RESPIRATORY_TRACT
  Filled 2018-08-10 (×2): qty 3

## 2018-08-10 MED ORDER — SODIUM CHLORIDE 0.9% FLUSH: 3.0000 mL | Freq: Once | INTRAVENOUS | Status: DC

## 2018-08-10 MED ORDER — OSELTAMIVIR PHOSPHATE 30 MG PO CAPS
30.0000 mg | ORAL_CAPSULE | Freq: Two times a day (BID) | ORAL | Status: DC
  Administered 2018-08-11: 30 mg via ORAL
  Filled 2018-08-10 (×3): qty 1

## 2018-08-10 MED ORDER — OSELTAMIVIR PHOSPHATE 75 MG PO CAPS
75.0000 mg | ORAL_CAPSULE | Freq: Once | ORAL | Status: AC
  Administered 2018-08-10: 75 mg via ORAL
  Filled 2018-08-10: qty 1

## 2018-08-10 MED ORDER — SODIUM CHLORIDE 0.9 % IV SOLN
INTRAVENOUS | Status: AC
  Administered 2018-08-10: 23:00:00 via INTRAVENOUS

## 2018-08-10 MED ORDER — ACETAMINOPHEN 325 MG PO TABS
325.0000 mg | ORAL_TABLET | Freq: Once | ORAL | Status: AC
  Administered 2018-08-10: 325 mg via ORAL
  Filled 2018-08-10: qty 1

## 2018-08-10 MED ORDER — ACETAMINOPHEN 325 MG PO TABS
650.0000 mg | ORAL_TABLET | Freq: Once | ORAL | Status: AC | PRN
  Administered 2018-08-10: 650 mg via ORAL
  Filled 2018-08-10: qty 2

## 2018-08-10 MED ORDER — SODIUM CHLORIDE 0.9 % IV SOLN: 500.0000 mg | INTRAVENOUS | Status: DC

## 2018-08-10 MED ORDER — KETOROLAC TROMETHAMINE 15 MG/ML IJ SOLN
15.0000 mg | Freq: Once | INTRAMUSCULAR | Status: AC
  Administered 2018-08-10: 15 mg via INTRAVENOUS
  Filled 2018-08-10: qty 1

## 2018-08-10 MED ORDER — METHYLPREDNISOLONE SODIUM SUCC 125 MG IJ SOLR
125.0000 mg | Freq: Once | INTRAMUSCULAR | Status: AC
  Administered 2018-08-10: 125 mg via INTRAVENOUS
  Filled 2018-08-10: qty 2

## 2018-08-10 MED ORDER — SODIUM CHLORIDE 0.9 % IV SOLN
1.0000 g | Freq: Once | INTRAVENOUS | Status: AC
  Administered 2018-08-10: 1 g via INTRAVENOUS
  Filled 2018-08-10: qty 10

## 2018-08-10 MED ORDER — SODIUM CHLORIDE 0.9 % IV SOLN
500.0000 mg | Freq: Once | INTRAVENOUS | Status: AC
  Administered 2018-08-10: 500 mg via INTRAVENOUS
  Filled 2018-08-10: qty 500

## 2018-08-10 MED ORDER — SODIUM CHLORIDE 0.9 % IV BOLUS
500.0000 mL | Freq: Once | INTRAVENOUS | Status: AC
  Administered 2018-08-10: 500 mL via INTRAVENOUS

## 2018-08-10 MED ORDER — OSELTAMIVIR PHOSPHATE 75 MG PO CAPS: 75.0000 mg | ORAL_CAPSULE | Freq: Two times a day (BID) | ORAL | Status: DC

## 2018-08-10 MED ORDER — SODIUM CHLORIDE 0.9 % IV BOLUS: 1000.0000 mL | Freq: Once | INTRAVENOUS | Status: DC

## 2018-08-10 MED ORDER — SODIUM CHLORIDE 0.9 % IV SOLN: 1.0000 g | INTRAVENOUS | Status: DC

## 2018-08-10 NOTE — ED Triage Notes (Signed)
Pt states he started having CP SOB last night. Pt has hx of CHF. Pt complains that pain travels into left arm.

## 2018-08-10 NOTE — ED Notes (Signed)
Pt is 89% on room air at this time. Placed on 2L via [place] by this RN. Pt 94% on 2L.

## 2018-08-10 NOTE — H&P (Addendum)
History and Physical    Chris [name] FKC:[id number] DOB: [birth year] DOA: 08/10/2018  PCP: [name], MD Patient coming from: Home  Chief Complaint: Shortness of breath  HPI: Chris [name] is a 58 y.o. male with medical history significant of CHF, COPD, CKD, hypertension, gout, depression presenting to the hospital for evaluation of shortness of breath. History provided by patient and his male friend at bedside.  He has been feeling short of breath, coughing, and wheezing for the past 1 day.  Also having chills and body aches.  His cough is nonproductive.  Reports having orthopnea but no lower extremity edema.  Reports having substernal chest tightness only for the past 1 day since he has been very short of breath.  No nausea, vomiting, or diarrhea.  Review of Systems: As per HPI otherwise 10 point review of systems negative.  Past Medical History:  Diagnosis Date  . CHF (congestive heart failure) ([place])   . Chronic kidney disease   . COPD (chronic obstructive pulmonary disease) ([place])   . Depression   . Gout   . Hypertension   . Mental disorder     Past Surgical History:  Procedure Laterality Date  . ANKLE SURGERY    . CARDIAC CATHETERIZATION    . SHOULDER SURGERY       reports that he has been smoking cigarettes. He has a 20.00 pack-year smoking history. He has never used smokeless tobacco. He reports current drug use. Frequency: 21.00 times per week. Drug: Marijuana. He reports that he does not drink alcohol.  No Known Allergies  Family History  Problem Relation Age of Onset  . Heart disease Father     Prior to Admission medications   Medication Sig Start Date End Date Taking? Authorizing Provider  albuterol (PROVENTIL HFA;VENTOLIN HFA) 108 (90 Base) MCG/ACT inhaler Inhale 2 puffs into the lungs every 6 (six) hours as needed for wheezing or shortness of breath. 04/21/18   [name], MD  amLODipine (NORVASC) 10 MG tablet Take 1 tablet (10 mg total) by  mouth daily. 04/21/18   [name], MD  atorvastatin (LIPITOR) 40 MG tablet Take 1 tablet (40 mg total) by mouth daily at 6 PM. 04/21/18   [name], MD  carvedilol (COREG) 25 MG tablet Take 1 tablet (25 mg total) by mouth 2 (two) times daily with a meal. 04/21/18   [name], MD  losartan (COZAAR) 50 MG tablet Take 1 tablet (50 mg total) by mouth daily. 04/21/18   [name], MD  montelukast (SINGULAIR) 10 MG tablet Take 1 tablet (10 mg total) by mouth at bedtime. 07/28/17   Alfonse Spruce, FNP  sodium chloride (OCEAN) 0.65 % SOLN nasal spray Place 1 spray into both nostrils as needed for congestion. 04/21/18   [name], MD    Physical Exam: Vitals:   08/10/18 1613 08/10/18 1845 08/10/18 1900 08/10/18 1930  BP: (!) 180/113 (!) 165/105 (!) 165/95 (!) 154/93  Pulse:  97 84 90  Resp: (!) 30 (!) 43 (!) 34 (!) 37  Temp: (!) 102.4 F (39.1 C)     TempSrc: Oral     SpO2: 91% 93% 93% 96%  Weight:      Height:        Physical Exam  Constitutional: He is oriented to person, place, and time. He appears well-developed and well-nourished.  HENT:  Head: Normocephalic.  Dry mucous membranes  Eyes: Right eye exhibits no discharge. Left eye  exhibits no discharge.  Neck: Neck supple.  Cardiovascular: Normal rate, regular rhythm and intact distal pulses.  Pulmonary/Chest: No respiratory distress. He has wheezes. He has no rales.  Mild diffuse end expiratory wheezing Increased work of breathing On 2 L supplemental oxygen  Abdominal: Soft. Bowel sounds are normal. He exhibits no distension. There is no abdominal tenderness. There is no guarding.  Musculoskeletal:        General: No edema.  Neurological: He is alert and oriented to person, place, and time.  Skin: Skin is warm and dry.     Labs on Admission: I have personally reviewed following labs and imaging studies  CBC: Recent Labs  Lab 08/10/18 1617 08/10/18 1857  WBC 9.2  --   HGB 14.0  13.6  HCT 43.0 40.0  MCV 92.3  --   PLT 147*  --    Basic Metabolic Panel: Recent Labs  Lab 08/10/18 1617 08/10/18 1857  NA 133* 133*  K 4.0 3.7  CL 99  --   CO2 25  --   GLUCOSE 106*  --   BUN 16  --   CREATININE 1.98*  --   CALCIUM 8.7*  --    GFR: Estimated Creatinine Clearance: 43.8 mL/min (A) (by C-G formula based on SCr of 1.98 mg/dL (H)). Liver Function Tests: No results for input(s): AST, ALT, ALKPHOS, BILITOT, PROT, ALBUMIN in the last 168 hours. No results for input(s): LIPASE, AMYLASE in the last 168 hours. No results for input(s): AMMONIA in the last 168 hours. Coagulation Profile: No results for input(s): INR, PROTIME in the last 168 hours. Cardiac Enzymes: No results for input(s): CKTOTAL, CKMB, CKMBINDEX, TROPONINI in the last 168 hours. BNP (last 3 results) No results for input(s): PROBNP in the last 8760 hours. HbA1C: No results for input(s): HGBA1C in the last 72 hours. CBG: No results for input(s): GLUCAP in the last 168 hours. Lipid Profile: No results for input(s): CHOL, HDL, LDLCALC, TRIG, CHOLHDL, LDLDIRECT in the last 72 hours. Thyroid Function Tests: No results for input(s): TSH, T4TOTAL, FREET4, T3FREE, THYROIDAB in the last 72 hours. Anemia Panel: No results for input(s): VITAMINB12, FOLATE, FERRITIN, TIBC, IRON, RETICCTPCT in the last 72 hours. Urine analysis:    Component Value Date/Time   PROTEINUR 30 mg/dL 10/22/2013 1910    Radiological Exams on Admission: Dg Chest 2 View  Result Date: 08/10/2018 CLINICAL DATA:  Shortness of breath EXAM: CHEST - 2 VIEW COMPARISON:  10/20/2013 FINDINGS: Two-view exam shows hyperexpansion. There is pulmonary vascular congestion without overt pulmonary edema. Lateral film markedly degraded by motion artifact. No focal consolidation or overt airspace pulmonary edema. No pleural effusion. 16 mm nodular density projects over the posterior left eighth rib. Cardiopericardial silhouette is at upper limits of  normal for size. The visualized bony structures of the thorax are intact. IMPRESSION: Borderline cardiomegaly with mild vascular congestion. 16 mm left lung nodule. Dedicated CT chest without contrast recommended to further evaluate. Electronically Signed   By: Misty Stanley M.D.   On: 08/10/2018 17:08    EKG: Independently reviewed.  Sinus rhythm, nonspecific T wave abnormality.  Assessment/Plan Principal Problem:   Acute respiratory failure with hypoxia (HCC) Active Problems:   SIRS (systemic inflammatory response syndrome) (HCC)   Chest tightness   CKD (chronic kidney disease) stage 3, GFR 30-59 ml/min (HCC)   Incidental lung nodule   Acute hypoxic respiratory failure -Tachypneic and increased work of breathing.  SPO2 89% on room air, currently on 2 L supplemental oxygen. -  Chest x-ray with mild vascular congestion, however, patient does not appear volume overloaded on exam.  No rales noted on auscultation of lungs and no lower extremity edema.  Check BNP. -Chest x-ray without consolidation.  Will continue coverage for CAP at this time given fever.  Check procalcitonin level. -He is endorsing symptoms of influenza.  Tamiflu has been started empirically.  Influenza panel pending. -Mild COPD exacerbation could also be contributing.  Mild wheezing on exam.  Give Solu-Medrol 125 mg once, then prednisone 40 mg daily starting tomorrow.  DuoNebs every 6 hours. -Chest CT without contrast  SIRS -Febrile and tachypneic.  No clear source of infection at this time.  Chest x-ray without consolidation. -Continue antibiotics for coverage of CAP given fever -IV fluid hydration as patient appears dry on exam.  Discontinue IV fluid if BNP significantly elevated or chest CT with evidence of overt pulmonary edema. -Check procalcitonin level -Check UA -Blood culture x2 -Tylenol PRN  Chest tightness -Reports substernal chest tightness for the past 1 day since he has been dyspneic and wheezing. -Likely  related to bronchospasm.  Noted to be wheezing.  -Point-of-care troponin negative.  EKG not suggestive of ACS. -Cardiac monitoring  CKD 3 -Stable.  BUN 16.  Creatinine 1.9, baseline ranging from 1.5-2.0. -Continue to monitor renal function  Incidental lung nodule Chest x-ray showing 16 mm left lung nodule.  -Chest CT without contrast for further evaluation  DVT prophylaxis: Lovenox Code Status: Full code Family Communication: Male friend at bedside. Disposition Plan: Anticipate discharge after clinical improvement. Consults called: None Admission status: Observation, telemetry   Shela Leff MD [facility] Pager [phone]  If 7PM-7AM, please contact night-coverage [email or web address] Password [facility]  08/10/2018, 8:23 PM

## 2018-08-10 NOTE — ED Notes (Signed)
Dinner tray ordered-heart healthy 

## 2018-08-11 ENCOUNTER — Encounter (HOSPITAL_COMMUNITY): Payer: Self-pay | Admitting: Emergency Medicine

## 2018-08-11 DIAGNOSIS — J189 Pneumonia, unspecified organism: Secondary | ICD-10-CM | POA: Diagnosis present

## 2018-08-11 DIAGNOSIS — R911 Solitary pulmonary nodule: Secondary | ICD-10-CM

## 2018-08-11 DIAGNOSIS — R6510 Systemic inflammatory response syndrome (SIRS) of non-infectious origin without acute organ dysfunction: Secondary | ICD-10-CM

## 2018-08-11 DIAGNOSIS — J449 Chronic obstructive pulmonary disease, unspecified: Secondary | ICD-10-CM | POA: Diagnosis present

## 2018-08-11 DIAGNOSIS — J101 Influenza due to other identified influenza virus with other respiratory manifestations: Secondary | ICD-10-CM | POA: Diagnosis present

## 2018-08-11 DIAGNOSIS — N183 Chronic kidney disease, stage 3 (moderate): Secondary | ICD-10-CM

## 2018-08-11 DIAGNOSIS — I5022 Chronic systolic (congestive) heart failure: Secondary | ICD-10-CM

## 2018-08-11 LAB — CBC
HCT: 41.1 % (ref 39.0–52.0)
Hemoglobin: 13.5 g/dL (ref 13.0–17.0)
MCH: 29.7 pg (ref 26.0–34.0)
MCHC: 32.8 g/dL (ref 30.0–36.0)
MCV: 90.3 fL (ref 80.0–100.0)
NRBC: 0 % (ref 0.0–0.2)
Platelets: 133 10*3/uL — ABNORMAL LOW (ref 150–400)
RBC: 4.55 MIL/uL (ref 4.22–5.81)
RDW: 14.1 % (ref 11.5–15.5)
WBC: 8.9 10*3/uL (ref 4.0–10.5)

## 2018-08-11 LAB — BASIC METABOLIC PANEL
Anion gap: 9 (ref 5–15)
BUN: 16 mg/dL (ref 6–20)
CALCIUM: 8.3 mg/dL — AB (ref 8.9–10.3)
CO2: 24 mmol/L (ref 22–32)
Chloride: 100 mmol/L (ref 98–111)
Creatinine, Ser: 1.77 mg/dL — ABNORMAL HIGH (ref 0.61–1.24)
GFR calc Af Amer: 49 mL/min — ABNORMAL LOW (ref >60)
GFR calc non Af Amer: 42 mL/min — ABNORMAL LOW (ref >60)
Glucose, Bld: 145 mg/dL — ABNORMAL HIGH (ref 70–99)
Potassium: 3.9 mmol/L (ref 3.5–5.1)
Sodium: 133 mmol/L — ABNORMAL LOW (ref 135–145)

## 2018-08-11 MED ORDER — IPRATROPIUM-ALBUTEROL 0.5-2.5 (3) MG/3ML IN SOLN
3.0000 mL | RESPIRATORY_TRACT | Status: DC
  Administered 2018-08-11: 3 mL via RESPIRATORY_TRACT
  Filled 2018-08-11 (×2): qty 3

## 2018-08-11 MED ORDER — METHYLPREDNISOLONE SODIUM SUCC 125 MG IJ SOLR
60.0000 mg | Freq: Four times a day (QID) | INTRAMUSCULAR | Status: DC
  Administered 2018-08-11: 60 mg via INTRAVENOUS
  Filled 2018-08-11: qty 2

## 2018-08-11 MED ORDER — OSELTAMIVIR PHOSPHATE 30 MG PO CAPS: 30.0000 mg | ORAL_CAPSULE | Freq: Two times a day (BID) | ORAL | 0 refills | Status: DC

## 2018-08-11 NOTE — Discharge Summary (Signed)
Patient left AGAINST MEDICAL ADVICE.  See progress note from today 08/11/18.

## 2018-08-11 NOTE — Progress Notes (Signed)
[facility] PROGRESS NOTE  ONDRA DEBOARD AJG:[id number] DOB: 1961/03/15 DOA: 08/10/2018 PCP: [name], MD  Assessment/Plan: Acute hypoxic respiratory failure secondary to multifocal pneumonia per CT as well as copd exacerbation in setting of influenza A.  SPO2 84% on room air with ambulation. Was 89% on room air at rest on admission. Chest x-ray with mild vascular congestion. BNP 592. Chest x-ray without consolidation.  He recieved coverage for CAP given fever. Mild COPD exacerbation could also be contributing.  Decreased breath sounds on exam this am. He was provided with  Solu-Medrol 125 mg once. -patient left ama this am in spite of recommendations  Sepsis. Related to cap. He was febrile and tachypneic on admission. See #1  Chest tightness. Denies discomfort this am. Likely related to bronchospasm. Point-of-care troponin negative.  EKG not suggestive of ACS. -see #1  CKD 3 -Stable.  BUN 16.  Creatinine 1.9, baseline ranging from 1.5-2.0. -see #1  Incidental lung nodule Nodular ground-glass airspace disease in the left upper lobe and to a lesser extent the lower lobes and right upper lobe. Findings compatible with multifocal pneumonia Chest x-ray showing 16 mm left lung nodule.  -recommended OP follow up as patient left AMA   Code Status: full Family Communication: significant other at bedside Disposition Plan: left ama   Consultants:    Procedures:    Antibiotics:    HPI/Subjective: Lying in bed with increased work of breathing. Reports wanting to leave. Encouraged to stay  Objective: Vitals:   08/11/18 0800 08/11/18 0818  BP:  (!) 168/96  Pulse: (!) 108 88  Resp: (!) 30 (!) 28  Temp:    SpO2: (!) 85% 92%    Intake/Output Summary (Last 24 hours) at 08/11/2018 1032 Last data filed at 08/11/2018 0719 Gross per 24 hour  Intake 1252.92 ml  Output -  Net 1252.92 ml   Filed Weights   08/10/18 1611  Weight: 73.5 kg     Exam:   General:  Awake alert no acute distress  Cardiovascular: rrr no mgr trace LE edema  Respiratory: mild increased work of breathing with conversation. BS quite diminished throughout. No wheeze or crackles  Abdomen: obese soft +BS no guardi or rebounding  Musculoskeletal: joints without swelling/erythema   Data Reviewed: Basic Metabolic Panel: Recent Labs  Lab 08/10/18 1617 08/10/18 1857 08/11/18 0554  NA 133* 133* 133*  K 4.0 3.7 3.9  CL 99  --  100  CO2 25  --  24  GLUCOSE 106*  --  145*  BUN 16  --  16  CREATININE 1.98*  --  1.77*  CALCIUM 8.7*  --  8.3*   Liver Function Tests: No results for input(s): AST, ALT, ALKPHOS, BILITOT, PROT, ALBUMIN in the last 168 hours. No results for input(s): LIPASE, AMYLASE in the last 168 hours. No results for input(s): AMMONIA in the last 168 hours. CBC: Recent Labs  Lab 08/10/18 1617 08/10/18 1857 08/11/18 0744  WBC 9.2  --  8.9  HGB 14.0 13.6 13.5  HCT 43.0 40.0 41.1  MCV 92.3  --  90.3  PLT 147*  --  133*   Cardiac Enzymes: No results for input(s): CKTOTAL, CKMB, CKMBINDEX, TROPONINI in the last 168 hours. BNP (last 3 results) Recent Labs    08/10/18 2124  BNP 592.5*    ProBNP (last 3 results) No results for input(s): PROBNP in the last 8760 hours.  CBG: No results for input(s): GLUCAP in the last 168 hours.  Recent Results (  from the past 240 hour(s))  Culture, blood (routine x 2)     Status: None (Preliminary result)   Collection Time: 08/10/18  9:20 PM  Result Value Ref Range Status   Specimen Description BLOOD RIGHT ARM  Final   Special Requests   Final    BOTTLES DRAWN AEROBIC AND ANAEROBIC Blood Culture results may not be optimal due to an inadequate volume of blood received in culture bottles   Culture NO GROWTH < 12 HOURS  Final   Report Status PENDING  Incomplete  Culture, blood (routine x 2)     Status: None (Preliminary result)   Collection Time: 08/10/18  9:25 PM  Result Value Ref  Range Status   Specimen Description BLOOD LEFT WRIST  Final   Special Requests   Final    BOTTLES DRAWN AEROBIC AND ANAEROBIC Blood Culture adequate volume   Culture NO GROWTH < 12 HOURS  Final   Report Status PENDING  Incomplete     Studies: Dg Chest 2 View  Result Date: 08/10/2018 CLINICAL DATA:  Shortness of breath EXAM: CHEST - 2 VIEW COMPARISON:  10/20/2013 FINDINGS: Two-view exam shows hyperexpansion. There is pulmonary vascular congestion without overt pulmonary edema. Lateral film markedly degraded by motion artifact. No focal consolidation or overt airspace pulmonary edema. No pleural effusion. 16 mm nodular density projects over the posterior left eighth rib. Cardiopericardial silhouette is at upper limits of normal for size. The visualized bony structures of the thorax are intact. IMPRESSION: Borderline cardiomegaly with mild vascular congestion. 16 mm left lung nodule. Dedicated CT chest without contrast recommended to further evaluate. Electronically Signed   By: Misty Stanley M.D.   On: 08/10/2018 17:08   Ct Chest Wo Contrast  Result Date: 08/10/2018 CLINICAL DATA:  Shortness of breath, chest tightness, cough EXAM: CT CHEST WITHOUT CONTRAST TECHNIQUE: Multidetector CT imaging of the chest was performed following the standard protocol without IV contrast. COMPARISON:  Chest x-ray earlier today FINDINGS: Cardiovascular: Mild cardiomegaly. Aorta is normal caliber with scattered aortic calcifications. Mediastinum/Nodes: Mildly enlarged mediastinal lymph nodes. Prevascular lymph node has a short axis diameter of 11 mm. Borderline sized AP window, paratracheal lymph nodes. No axillary adenopathy. Difficult to assess the hila for adenopathy without intravenous contrast. Lungs/Pleura: Ground-glass nodular airspace disease noted throughout the left upper lobe and to a lesser extent in the lower lobes bilaterally and right upper lobe compatible with multifocal pneumonia. No effusions. Upper  Abdomen: Imaging into the upper abdomen shows no acute findings. Musculoskeletal: Chest wall soft tissues are unremarkable. No acute bony abnormality. IMPRESSION: Nodular ground-glass airspace disease in the left upper lobe and to a lesser extent the lower lobes and right upper lobe. Findings compatible with multifocal pneumonia. Cardiomegaly. Mild mediastinal adenopathy, likely reactive. Aortic Atherosclerosis (ICD10-I70.0). Electronically Signed   By: Rolm Baptise M.D.   On: 08/10/2018 23:20    Scheduled Meds: . cloNIDine  0.1 mg Oral Once  . ipratropium-albuterol  3 mL Nebulization Q4H  . methylPREDNISolone (SOLU-MEDROL) injection  60 mg Intravenous Q6H  . oseltamivir  30 mg Oral BID  . sodium chloride flush  3 mL Intravenous Once   Continuous Infusions: . azithromycin    . cefTRIAXone (ROCEPHIN)  IV      Principal Problem:   Acute respiratory failure with hypoxia (HCC) Active Problems:   SIRS (systemic inflammatory response syndrome) (HCC)   COPD (chronic obstructive pulmonary disease) (HCC)   Influenza A with respiratory manifestations   Chronic systolic heart failure ([place])  Chest tightness   CKD (chronic kidney disease) stage 3, GFR 30-59 ml/min (HCC)   Incidental lung nodule   CAP (community acquired pneumonia)    Time spent: 4 minutes    Radene Gunning NP  [facility]  If 7PM-7AM, please contact night-coverage at [email or web address], password [facility] 08/11/2018, 10:32 AM  LOS: 0 days

## 2018-08-11 NOTE — Progress Notes (Signed)
Pt ambulated down hall.  O2 saturation dropped from 92% to 84%.  Respirations increased to 32, then saturation climbed backed to 92%.

## 2018-08-14 NOTE — ED Provider Notes (Signed)
[facility] Provider Note   CSN: [id number] Arrival date & time: 08/10/18  1607    History   Chief Complaint Chief Complaint  Patient presents with  . Chest Pain  . Shortness of Breath    HPI Chris [name] is a 58 y.o. male.     57 yo M with a chief complaint of shortness of breath.  Going on for the past few days.  Having some orthopnea but no lower extremity edema.  Having a mild cough.  The history is provided by the patient.  Chest Pain  Associated symptoms: no abdominal pain, no fever, no headache, no palpitations, no shortness of breath and no vomiting   Shortness of Breath  Associated symptoms: no abdominal pain, no chest pain, no fever, no headaches, no rash and no vomiting   Illness  Severity:  Mild Onset quality:  Gradual Duration:  2 days Timing:  Constant Progression:  Worsening Chronicity:  New Associated symptoms: no abdominal pain, no chest pain, no congestion, no diarrhea, no fever, no headaches, no myalgias, no rash, no shortness of breath and no vomiting     Past Medical History:  Diagnosis Date  . CHF (congestive heart failure) ([place])   . Chronic kidney disease   . COPD (chronic obstructive pulmonary disease) ([place])   . Depression   . Gout   . Hypertension   . Influenza A with respiratory manifestations   . Mental disorder     Patient Active Problem List   Diagnosis Date Noted  . CAP (community acquired pneumonia) 08/11/2018  . COPD (chronic obstructive pulmonary disease) ([place])   . Influenza A with respiratory manifestations   . Acute respiratory failure with hypoxia ([place]) 08/10/2018  . SIRS (systemic inflammatory response syndrome) ([place]) 08/10/2018  . Chest tightness 08/10/2018  . CKD (chronic kidney disease) stage 3, GFR 30-59 ml/min (H[facility]) 08/10/2018  . Incidental lung nodule 08/10/2018  . Recurrent epistaxis 04/21/2018  . Mixed hyperlipidemia 07/28/2017  . Essential hypertension 07/28/2017  . Chronic systolic  heart failure ([employer]) 10/25/2014  . Major depression, recurrent ([place]) 02/20/2013  . Cocaine abuse ([place]) 02/20/2013  . Cannabis abuse 02/20/2013  . Back pain, chronic 02/20/2013    Past Surgical History:  Procedure Laterality Date  . ANKLE SURGERY    . CARDIAC CATHETERIZATION    . SHOULDER SURGERY          Home Medications    Prior to Admission medications   Medication Sig Start Date End Date Taking? Authorizing Provider  albuterol (PROVENTIL HFA;VENTOLIN HFA) 108 (90 Base) MCG/ACT inhaler Inhale 2 puffs into the lungs every 6 (six) hours as needed for wheezing or shortness of breath. 04/21/18   [name], MD  amLODipine (NORVASC) 10 MG tablet Take 1 tablet (10 mg total) by mouth daily. 04/21/18   [name], MD  atorvastatin (LIPITOR) 40 MG tablet Take 1 tablet (40 mg total) by mouth daily at 6 PM. 04/21/18   [name], MD  carvedilol (COREG) 25 MG tablet Take 1 tablet (25 mg total) by mouth 2 (two) times daily with a meal. 04/21/18   [name], MD  losartan (COZAAR) 50 MG tablet Take 1 tablet (50 mg total) by mouth daily. 04/21/18   [name], MD  montelukast (SINGULAIR) 10 MG tablet Take 1 tablet (10 mg total) by mouth at bedtime. 07/28/17   Alfonse Spruce, FNP  oseltamivir (TAMIFLU) 30 MG capsule Take 1 capsule (30 mg total)  by mouth 2 (two) times daily. 08/11/18   Norval Morton, MD  sodium chloride (OCEAN) 0.65 % SOLN nasal spray Place 1 spray into both nostrils as needed for congestion. 04/21/18   [name], MD    Family History Family History  Problem Relation Age of Onset  . Heart disease Father     Social History Social History   Tobacco Use  . Smoking status: Current Every Day Smoker    Packs/day: 1.00    Years: 20.00    Pack years: 20.00    Types: Cigarettes  . Smokeless tobacco: Never Used  . Tobacco comment: 3/4 of a pack  Substance Use Topics  . Alcohol use: No  . Drug use: Yes    Frequency: 21.0 times  per week    Types: Marijuana    Comment: no longer- Cocaine     Allergies   Patient has no known allergies.   Review of Systems Review of Systems  Constitutional: Negative for chills and fever.  HENT: Negative for congestion and facial swelling.   Eyes: Negative for discharge and visual disturbance.  Respiratory: Negative for shortness of breath.   Cardiovascular: Negative for chest pain and palpitations.  Gastrointestinal: Negative for abdominal pain, diarrhea and vomiting.  Musculoskeletal: Negative for arthralgias and myalgias.  Skin: Negative for color change and rash.  Neurological: Negative for tremors, syncope and headaches.  Psychiatric/Behavioral: Negative for confusion and dysphoric mood.     Physical Exam Updated Vital Signs BP (!) 168/96 (BP Location: Left Arm)   Pulse 88   Temp 98.2 F (36.8 C) (Oral)   Resp (!) 28   Ht 6' (1.829 m)   Wt 73.5 kg   SpO2 92%   BMI 21.97 kg/m   Physical Exam Vitals signs and nursing note reviewed.  Constitutional:      Appearance: He is well-developed.  HENT:     Head: Normocephalic and atraumatic.  Eyes:     Pupils: Pupils are equal, round, and reactive to light.  Neck:     Musculoskeletal: Normal range of motion and neck supple.     Vascular: No JVD.  Cardiovascular:     Rate and Rhythm: Normal rate and regular rhythm.     Heart sounds: No murmur. No friction rub. No gallop.   Pulmonary:     Effort: No respiratory distress.     Breath sounds: No wheezing.  Abdominal:     General: There is no distension.     Tenderness: There is no guarding or rebound.  Musculoskeletal: Normal range of motion.  Skin:    Coloration: Skin is not pale.     Findings: No rash.  Neurological:     Mental Status: He is alert and oriented to person, place, and time.  Psychiatric:        Behavior: Behavior normal.      ED Treatments / Results  Labs (all labs ordered are listed, but only abnormal results are displayed) Labs  Reviewed  BASIC METABOLIC PANEL - Abnormal; Notable for the following components:      Result Value   Sodium 133 (*)    Glucose, Bld 106 (*)    Creatinine, Ser 1.98 (*)    Calcium 8.7 (*)    GFR calc non Af Amer 37 (*)    GFR calc Af Amer 43 (*)    All other components within normal limits  CBC - Abnormal; Notable for the following components:   Platelets 147 (*)  All other components within normal limits  INFLUENZA PANEL BY PCR (TYPE A & B) - Abnormal; Notable for the following components:   Influenza A By PCR POSITIVE (*)    All other components within normal limits  BASIC METABOLIC PANEL - Abnormal; Notable for the following components:   Sodium 133 (*)    Glucose, Bld 145 (*)    Creatinine, Ser 1.77 (*)    Calcium 8.3 (*)    GFR calc non Af Amer 42 (*)    GFR calc Af Amer 49 (*)    All other components within normal limits  BRAIN NATRIURETIC PEPTIDE - Abnormal; Notable for the following components:   B Natriuretic Peptide 592.5 (*)    All other components within normal limits  URINALYSIS, ROUTINE W REFLEX MICROSCOPIC - Abnormal; Notable for the following components:   Color, Urine STRAW (*)    Hgb urine dipstick LARGE (*)    Protein, ur 100 (*)    Bacteria, UA RARE (*)    All other components within normal limits  CBC - Abnormal; Notable for the following components:   Platelets 133 (*)    All other components within normal limits  POCT I-STAT EG7 - Abnormal; Notable for the following components:   pCO2, Ven 40.9 (*)    Sodium 133 (*)    All other components within normal limits  CULTURE, BLOOD (ROUTINE X 2)  CULTURE, BLOOD (ROUTINE X 2)  PROCALCITONIN  I-STAT TROPONIN, ED    EKG EKG Interpretation  Date/Time:  Thursday August 10 2018 16:12:02 EST Ventricular Rate:  96 PR Interval:  132 QRS Duration: 88 QT Interval:  342 QTC Calculation: 432 R Axis:   47 Text Interpretation:  Normal sinus rhythm Biatrial enlargement Nonspecific T wave abnormality  Abnormal ECG biphasic t waves in inferior leads seen on prior flipped t wave in lateral leads seen on prior No significant change since last tracing Confirmed by Deno Etienne [phone]) on 08/10/2018 5:35:09 PM   Radiology No results found.  Procedures Procedures (including critical care time)  Medications Ordered in ED Medications  0.9 %  sodium chloride infusion ( Intravenous Rate/Dose Verify 08/11/18 0719)  acetaminophen (TYLENOL) tablet 650 mg (650 mg Oral Given 08/10/18 1618)  sodium chloride 0.9 % bolus 500 mL (500 mLs Intravenous New Bag/Given 08/10/18 1847)  ketorolac (TORADOL) 15 MG/ML injection 15 mg (15 mg Intravenous Given 08/10/18 1843)  acetaminophen (TYLENOL) tablet 325 mg (325 mg Oral Given 08/10/18 1843)  oseltamivir (TAMIFLU) capsule 75 mg (75 mg Oral Given 08/10/18 1843)  cefTRIAXone (ROCEPHIN) 1 g in sodium chloride 0.9 % 100 mL IVPB (0 g Intravenous Stopped 08/10/18 1942)  azithromycin (ZITHROMAX) 500 mg in sodium chloride 0.9 % 250 mL IVPB (0 mg Intravenous Stopped 08/10/18 2142)  methylPREDNISolone sodium succinate (SOLU-MEDROL) 125 mg/2 mL injection 125 mg (125 mg Intravenous Given 08/10/18 2131)     Initial Impression / Assessment and Plan / ED Course  I have reviewed the triage vital signs and the nursing notes.  Pertinent labs & imaging results that were available during my care of the patient were reviewed by me and considered in my medical decision making (see chart for details).        58 yo M with a chief complaint of cough and shortness of breath.  Patient history concerning for pneumonia, will start on antibiotics.  Hypoxic started on 2 L of oxygen will admit.  Later found also be influenza A positive.  Tamiflu added.  The patients  results and plan were reviewed and discussed.   Any x-rays performed were independently reviewed by myself.   Differential diagnosis were considered with the presenting HPI.  Medications  0.9 %  sodium chloride infusion (  Intravenous Rate/Dose Verify 08/11/18 0719)  acetaminophen (TYLENOL) tablet 650 mg (650 mg Oral Given 08/10/18 1618)  sodium chloride 0.9 % bolus 500 mL (500 mLs Intravenous New Bag/Given 08/10/18 1847)  ketorolac (TORADOL) 15 MG/ML injection 15 mg (15 mg Intravenous Given 08/10/18 1843)  acetaminophen (TYLENOL) tablet 325 mg (325 mg Oral Given 08/10/18 1843)  oseltamivir (TAMIFLU) capsule 75 mg (75 mg Oral Given 08/10/18 1843)  cefTRIAXone (ROCEPHIN) 1 g in sodium chloride 0.9 % 100 mL IVPB (0 g Intravenous Stopped 08/10/18 1942)  azithromycin (ZITHROMAX) 500 mg in sodium chloride 0.9 % 250 mL IVPB (0 mg Intravenous Stopped 08/10/18 2142)  methylPREDNISolone sodium succinate (SOLU-MEDROL) 125 mg/2 mL injection 125 mg (125 mg Intravenous Given 08/10/18 2131)    Vitals:   08/11/18 0200 08/11/18 0423 08/11/18 0800 08/11/18 0818  BP:  (!) 149/95  (!) 168/96  Pulse: 95 86 (!) 108 88  Resp: (!) 25 18 (!) 30 (!) 28  Temp:  98.2 F (36.8 C)    TempSrc:  Oral    SpO2: 98% 95% (!) 85% 92%  Weight:      Height:        Final diagnoses:  Influenza due to identified novel influenza A virus with other respiratory manifestations    Admission/ observation were discussed with the admitting physician, patient and/or family and they are comfortable with the plan.    Final Clinical Impressions(s) / ED Diagnoses   Final diagnoses:  Influenza due to identified novel influenza A virus with other respiratory manifestations    ED Discharge Orders         Ordered    oseltamivir (TAMIFLU) 30 MG capsule  2 times daily    Note to Pharmacy:  Left hospital   08/11/18 [place], [place], DO 08/14/18 2308

## 2018-08-15 LAB — CULTURE, BLOOD (ROUTINE X 2)
CULTURE: NO GROWTH
Culture: NO GROWTH
Special Requests: ADEQUATE

## 2018-09-13 ENCOUNTER — Telehealth: Payer: Self-pay | Admitting: Internal Medicine

## 2018-09-13 NOTE — Telephone Encounter (Signed)
Patient would like to schedule an appointment to be seen for his BP and gout. Please follow up

## 2018-09-13 NOTE — Telephone Encounter (Signed)
Schedule pt a telephone visit with angela 09/14/2018

## 2018-09-14 NOTE — Telephone Encounter (Signed)
Attempted to contact patient no answer unable to LVM

## 2018-09-21 ENCOUNTER — Other Ambulatory Visit: Payer: Self-pay

## 2018-09-21 ENCOUNTER — Ambulatory Visit: Payer: Self-pay | Attending: Primary Care | Admitting: Primary Care

## 2018-09-27 ENCOUNTER — Other Ambulatory Visit: Payer: Self-pay | Admitting: Physician Assistant

## 2018-09-27 ENCOUNTER — Ambulatory Visit: Payer: Self-pay

## 2018-09-27 ENCOUNTER — Telehealth: Payer: Self-pay | Admitting: *Deleted

## 2018-09-27 ENCOUNTER — Other Ambulatory Visit: Payer: Self-pay

## 2018-09-27 DIAGNOSIS — I10 Essential (primary) hypertension: Secondary | ICD-10-CM

## 2018-09-27 MED ORDER — AMLODIPINE BESYLATE 10 MG PO TABS: 10.0000 mg | ORAL_TABLET | Freq: Every day | ORAL | 1 refills | Status: DC

## 2018-09-27 MED ORDER — CARVEDILOL 25 MG PO TABS: 25.0000 mg | ORAL_TABLET | Freq: Two times a day (BID) | ORAL | 1 refills | Status: DC

## 2018-09-27 MED ORDER — LOSARTAN POTASSIUM 50 MG PO TABS: 50.0000 mg | ORAL_TABLET | Freq: Every day | ORAL | 1 refills | Status: DC

## 2018-09-27 NOTE — Telephone Encounter (Signed)
I sent him 90 day supply and 1RF since he was seen by cardiology yesterday and forgot to ask for RF.  Please schedule an appt with his PCP in 6 weeks.

## 2018-09-27 NOTE — Telephone Encounter (Signed)
Pt request refill on blood pressure medication- -amlodipine -carvedilol -losartan Validated address in system.

## 2018-09-28 NOTE — Telephone Encounter (Signed)
Patient is aware of appointment scheduled 6/182020 with his PCP by [place].

## 2018-10-03 DIAGNOSIS — R0600 Dyspnea, unspecified: Secondary | ICD-10-CM | POA: Diagnosis not present

## 2018-10-03 DIAGNOSIS — R600 Localized edema: Secondary | ICD-10-CM | POA: Diagnosis not present

## 2018-10-03 DIAGNOSIS — I10 Essential (primary) hypertension: Secondary | ICD-10-CM | POA: Diagnosis not present

## 2018-10-03 DIAGNOSIS — I429 Cardiomyopathy, unspecified: Secondary | ICD-10-CM | POA: Diagnosis not present

## 2018-10-04 MED FILL — CARVEDILOL 25 MG TABLET: 25 | 30 days supply | Qty: 60 | Fill #0

## 2018-10-04 MED FILL — AMLODIPINE BESYLATE 10 MG T: 10 | 30 days supply | Qty: 30 | Fill #0

## 2018-11-30 ENCOUNTER — Other Ambulatory Visit: Payer: Self-pay

## 2018-11-30 ENCOUNTER — Ambulatory Visit: Payer: Self-pay | Attending: Internal Medicine | Admitting: Internal Medicine

## 2018-11-30 ENCOUNTER — Encounter: Payer: Self-pay | Admitting: Internal Medicine

## 2018-11-30 VITALS — BP 150/101 | HR 88 | Temp 97.9°F | Resp 16 | Wt 250.6 lb

## 2018-11-30 DIAGNOSIS — E785 Hyperlipidemia, unspecified: Secondary | ICD-10-CM

## 2018-11-30 DIAGNOSIS — F17200 Nicotine dependence, unspecified, uncomplicated: Secondary | ICD-10-CM | POA: Insufficient documentation

## 2018-11-30 DIAGNOSIS — R3129 Other microscopic hematuria: Secondary | ICD-10-CM | POA: Insufficient documentation

## 2018-11-30 DIAGNOSIS — F329 Major depressive disorder, single episode, unspecified: Secondary | ICD-10-CM | POA: Insufficient documentation

## 2018-11-30 DIAGNOSIS — F32A Depression, unspecified: Secondary | ICD-10-CM | POA: Insufficient documentation

## 2018-11-30 DIAGNOSIS — I10 Essential (primary) hypertension: Secondary | ICD-10-CM

## 2018-11-30 DIAGNOSIS — Z1211 Encounter for screening for malignant neoplasm of colon: Secondary | ICD-10-CM

## 2018-11-30 DIAGNOSIS — R454 Irritability and anger: Secondary | ICD-10-CM | POA: Insufficient documentation

## 2018-11-30 DIAGNOSIS — Z114 Encounter for screening for human immunodeficiency virus [HIV]: Secondary | ICD-10-CM

## 2018-11-30 DIAGNOSIS — M109 Gout, unspecified: Secondary | ICD-10-CM

## 2018-11-30 DIAGNOSIS — Z23 Encounter for immunization: Secondary | ICD-10-CM

## 2018-11-30 DIAGNOSIS — Z1159 Encounter for screening for other viral diseases: Secondary | ICD-10-CM

## 2018-11-30 DIAGNOSIS — N183 Chronic kidney disease, stage 3 unspecified: Secondary | ICD-10-CM

## 2018-11-30 DIAGNOSIS — R634 Abnormal weight loss: Secondary | ICD-10-CM

## 2018-11-30 MED ORDER — CARVEDILOL 25 MG PO TABS: 25.0000 mg | ORAL_TABLET | Freq: Two times a day (BID) | ORAL | 1 refills | Status: DC

## 2018-11-30 MED ORDER — AMLODIPINE BESYLATE 10 MG PO TABS: 10.0000 mg | ORAL_TABLET | Freq: Every day | ORAL | 1 refills | Status: DC

## 2018-11-30 MED ORDER — ALBUTEROL SULFATE HFA 108 (90 BASE) MCG/ACT IN AERS: 2.0000 | INHALATION_SPRAY | Freq: Four times a day (QID) | RESPIRATORY_TRACT | 6 refills | Status: DC | PRN

## 2018-11-30 MED ORDER — LOSARTAN POTASSIUM 50 MG PO TABS: 50.0000 mg | ORAL_TABLET | Freq: Every day | ORAL | 1 refills | Status: DC

## 2018-11-30 MED ORDER — BUPROPION HCL ER (SMOKING DET) 150 MG PO TB12: 150.0000 mg | ORAL_TABLET | Freq: Two times a day (BID) | ORAL | 3 refills | Status: DC

## 2018-11-30 MED ORDER — ATORVASTATIN CALCIUM 40 MG PO TABS: 40.0000 mg | ORAL_TABLET | Freq: Every day | ORAL | 2 refills | Status: DC

## 2018-11-30 MED ORDER — PREDNISONE 20 MG PO TABS: ORAL_TABLET | ORAL | 0 refills | Status: DC

## 2018-11-30 MED FILL — ?AMLODIPINE BESYLATE 10 MG: 10 | 30 days supply | Qty: 30 | Fill #0

## 2018-11-30 MED FILL — LOSARTAN POTASSIUM 50 MG TA: 50 | 30 days supply | Qty: 30 | Fill #0

## 2018-11-30 MED FILL — !VENTOLIN HFA INHALER: 108 (90 BAS | 25 days supply | Qty: 18 | Fill #0

## 2018-11-30 MED FILL — ?CARVEDILOL 25 MG TABLET: 25 | 30 days supply | Qty: 60 | Fill #0

## 2018-11-30 MED FILL — ?PREDNISONE 10 MG TABLET: 10 | 9 days supply | Qty: 22 | Fill #0

## 2018-11-30 MED FILL — BUPROPION SR 150 MG TABLET: 150 | 30 days supply | Qty: 60 | Fill #0

## 2018-11-30 MED FILL — ?ATORVASTATIN 40MG TABLET: 40 | 30 days supply | Qty: 30 | Fill #0

## 2018-11-30 NOTE — Progress Notes (Signed)
Pt states he ran out of his bp medicine 1-2 weeks ago

## 2018-11-30 NOTE — Progress Notes (Signed)
Patient ID: Chris [name], male    DOB: Mar 17, 1961  MRN: [id number]  CC: Hypertension and Gout   Subjective: Chris [name] is a 58 y.o. male who presents for chronic disease management. His concerns today include:  Pt with hx of HTN, HL, CKD 3, subst abuse, ? COPD,   HYPERTENSION Currently taking: see medication list Med Adherence: [x]  Yes -but ou of meds x 2 wks.  Pharmacy would not RF because he owns money    Medication side effects: []  Yes    [x]  No Adherence with salt restriction: [x]  Yes    []  No Home Monitoring?: []  Yes    [x]  No Monitoring Frequency: []  Yes    []  No Home BP results range: []  Yes    []  No SOB? [x]  Yes - "when I smoke those cigarettes."    []  No Chest Pain?: []  Yes    [x]  No Leg swelling?: []  Yes    [x]  No Headaches?: []  Yes    [x]  No Dizziness? []  Yes    [x]  No Comments:   HL:  Out of Lipitor for several weeks.  CKD 3:  He is aware that his kidney function is not 100%. -Was hospitalized back in February with influenza.  UA done at that time revealed microscopic hematuria.  Patient denies any gross hematuria. -takes Ibuprofen 4-5 pills QOD when he has gout flare.  Reports being dx with gout while in prison 5 yrs. No arthrocentesis.  Reports being on a pill back then to help prevent it but he does not recall the name.  He is not sure whether it was culture seen or allopurinol..  Having flare now in Rt foot big toe and ankle; always on RT foot.current flare started 5 days ago.    Tob dep:  1+ pk/day.  Feels he can quit on his own but reports he gets frustrated sitting around.  "I got real anger problems."  Also endorses depression but no SI/HI.  Would like to see counselor -was on med in 2011 but did not find it helpful; he does not recall the name of the medicine that he was on at that time. -smokes marijuana QOD  Wgh loss:  Loss 20 lbs 07/2017. Feels current weight loss is explainable.  "I am not eating right." Not exercising.  "I sit around the  house." No polyuria/dipsia Moving bowels okay.  No fhx of colon CA No palpitations  HM: Agreeable for hep C and HIV screening.  He is due for Tdap.  He is agreeable to having that done today.  Also agreeable to colon cancer screening with fit test.   Patient Active Problem List   Diagnosis Date Noted  . CAP (community acquired pneumonia) 08/11/2018  . COPD (chronic obstructive pulmonary disease) ([place])   . Influenza A with respiratory manifestations   . Acute respiratory failure with hypoxia ([place]) 08/10/2018  . SIRS (systemic inflammatory response syndrome) ([place]) 08/10/2018  . Chest tightness 08/10/2018  . CKD (chronic kidney disease) stage 3, GFR 30-59 ml/min ([facility]) 08/10/2018  . Incidental lung nodule 08/10/2018  . Recurrent epistaxis 04/21/2018  . Mixed hyperlipidemia 07/28/2017  . Essential hypertension 07/28/2017  . Chronic systolic heart failure ([place]) 10/25/2014  . Major depression, recurrent ([place]) 02/20/2013  . Cocaine abuse ([place]) 02/20/2013  . Cannabis abuse 02/20/2013  . Back pain, chronic 02/20/2013     Current Outpatient Medications on File Prior to Visit  Medication Sig Dispense Refill  . montelukast (SINGULAIR) 10 MG tablet  Take 1 tablet (10 mg total) by mouth at bedtime. 30 tablet 11   No current facility-administered medications on file prior to visit.     No Known Allergies  Social History   Socioeconomic History  . Marital status: Single    Spouse name: Not on file  . Number of children: Not on file  . Years of education: Not on file  . Highest education level: Not on file  Occupational History  . Not on file  Social Needs  . Financial resource strain: Not on file  . Food insecurity    Worry: Not on file    Inability: Not on file  . Transportation needs    Medical: Not on file    Non-medical: Not on file  Tobacco Use  . Smoking status: Current Every Day Smoker    Packs/day: 1.00    Years: 20.00    Pack years: 20.00    Types: Cigarettes  .  Smokeless tobacco: Never Used  . Tobacco comment: 3/4 of a pack  Substance and Sexual Activity  . Alcohol use: No  . Drug use: Yes    Frequency: 21.0 times per week    Types: Marijuana    Comment: no longer- Cocaine  . Sexual activity: Not on file  Lifestyle  . Physical activity    Days per week: Not on file    Minutes per session: Not on file  . Stress: Not on file  Relationships  . Social Herbalist on phone: Not on file    Gets together: Not on file    Attends religious service: Not on file    Active member of club or organization: Not on file    Attends meetings of clubs or organizations: Not on file    Relationship status: Not on file  . Intimate partner violence    Fear of current or ex partner: Not on file    Emotionally abused: Not on file    Physically abused: Not on file    Forced sexual activity: Not on file  Other Topics Concern  . Not on file  Social History Narrative   ** Merged History Encounter **        Family History  Problem Relation Age of Onset  . Heart disease Father     Past Surgical History:  Procedure Laterality Date  . ANKLE SURGERY    . CARDIAC CATHETERIZATION    . SHOULDER SURGERY      ROS: Review of Systems Negative except as stated above  PHYSICAL EXAM: BP (!) 150/101   Pulse 88   Temp 97.9 F (36.6 C) (Oral)   Resp 16   Wt 250 lb 9.6 oz (113.7 kg)   SpO2 96%   BMI 33.99 kg/m   Wt Readings from Last 3 Encounters:  11/30/18 250 lb 9.6 oz (113.7 kg)  08/10/18 162 lb (73.5 kg)  07/28/17 271 lb 9.6 oz (123.2 kg)   Physical Exam  General appearance - alert, well appearing, middle-aged African-American male and in no distress Mental status - normal mood, behavior, speech, dress, motor activity, and thought processes Mouth - mucous membranes moist, pharynx normal without lesions Neck - supple, no significant adenopathy Chest - clear to auscultation, no wheezes, rales or rhonchi, symmetric air entry Heart - normal  rate, regular rhythm, normal S1, S2, no murmurs, rubs, clicks or gallops Extremities - peripheral pulses normal MSK: Right foot- mild to moderate edema of the big toe on the first  MTP joint.  This joint is tender to touch.  No erythema.  Mild tenderness on palpation over the right lateral malleolus.  Discomfort with passive range of motion at the ankle joint.  Depression screen [facility] 2/9 11/30/2018 07/05/2017 05/13/2017  Decreased Interest 0 - 3  Down, Depressed, Hopeless 0 3 2  PHQ - 2 Score 0 3 5  Altered sleeping - 2 3  Tired, decreased energy - 2 3  Change in appetite - - 0  Feeling bad or failure about yourself  - 0 1  Trouble concentrating - 2 0  Moving slowly or fidgety/restless - - 0  Suicidal thoughts - 0 1  PHQ-9 Score - 9 13    CMP Latest Ref Rng & Units 08/11/2018 08/10/2018 08/10/2018  Glucose 70 - 99 mg/dL 145(H) - 106(H)  BUN 6 - 20 mg/dL 16 - 16  Creatinine 0.61 - 1.24 mg/dL 1.77(H) - 1.98(H)  Sodium 135 - 145 mmol/L 133(L) 133(L) 133(L)  Potassium 3.5 - 5.1 mmol/L 3.9 3.7 4.0  Chloride 98 - 111 mmol/L 100 - 99  CO2 22 - 32 mmol/L 24 - 25  Calcium 8.9 - 10.3 mg/dL 8.3(L) - 8.7(L)  Total Protein 6.0 - 8.5 g/dL - - -  Total Bilirubin 0.0 - 1.2 mg/dL - - -  Alkaline Phos 39 - 117 IU/L - - -  AST 0 - 40 IU/L - - -  ALT 0 - 44 IU/L - - -   Lipid Panel     Component Value Date/Time   CHOL 184 04/21/2018 1636   CHOL 157 10/21/2013 0435   TRIG 169 (H) 04/21/2018 1636   TRIG 117 10/21/2013 0435   HDL 32 (L) 04/21/2018 1636   HDL 30 (L) 10/21/2013 0435   CHOLHDL 5.8 (H) 04/21/2018 1636   VLDL 23 10/21/2013 0435   LDLCALC 118 (H) 04/21/2018 1636   LDLCALC 104 (H) 10/21/2013 0435    CBC    Component Value Date/Time   WBC 8.9 08/11/2018 0744   RBC 4.55 08/11/2018 0744   HGB 13.5 08/11/2018 0744   HGB 14.6 04/21/2018 1636   HCT 41.1 08/11/2018 0744   HCT 43.1 04/21/2018 1636   PLT 133 (L) 08/11/2018 0744   PLT 230 04/21/2018 1636   MCV 90.3 08/11/2018 0744   MCV  89 04/21/2018 1636   MCV 92 10/21/2013 0435   MCH 29.7 08/11/2018 0744   MCHC 32.8 08/11/2018 0744   RDW 14.1 08/11/2018 0744   RDW 14.1 04/21/2018 1636   RDW 15.7 (H) 10/21/2013 0435   LYMPHSABS 1.1 10/21/2013 0435   MONOABS 0.6 10/21/2013 0435   EOSABS 0.0 10/21/2013 0435   BASOSABS 0.0 10/21/2013 0435   ASSESSMENT AND PLAN: 1. Essential hypertension Not at goal.  He has been out of medications for 2 weeks.  Refill given.  DASH diet discussed and encouraged - CBC - Comprehensive metabolic panel - amLODipine (NORVASC) 10 MG tablet; Take 1 tablet (10 mg total) by mouth daily.  Dispense: 90 tablet; Refill: 1 - losartan (COZAAR) 50 MG tablet; Take 1 tablet (50 mg total) by mouth daily.  Dispense: 90 tablet; Refill: 1 - carvedilol (COREG) 25 MG tablet; Take 1 tablet (25 mg total) by mouth 2 (two) times daily with a meal.  Dispense: 180 tablet; Refill: 1  2. Hyperlipidemia, unspecified hyperlipidemia type Refill atorvastatin - Lipid panel - atorvastatin (LIPITOR) 40 MG tablet; Take 1 tablet (40 mg total) by mouth daily at 6 PM.  Dispense: 30 tablet; Refill:  2  3. CKD (chronic kidney disease) stage 3, GFR 30-59 ml/min (HCC) Advised to stop all NSAIDs as this will make kidney function worse. -Discussed the importance of good blood pressure control.  4. Tobacco dependence Patient advised to quit smoking. Discussed health risks associated with smoking including lung and other types of cancers, chronic lung diseases and CV risks.. Pt ready to give trail of quitting.  Discussed methods to help quit including quitting cold [place], use of NRT, Chantix and Bupropion.  He is willing to try bupropion which will also help with depression. Less than 5 minutes spent on counseling. - buPROPion (ZYBAN) 150 MG 12 hr tablet; Take 1 tablet (150 mg total) by mouth 2 (two) times daily.  Dispense: 60 tablet; Refill: 3  5. Acute gout of right foot, unspecified cause - Uric Acid - predniSONE (DELTASONE)  20 MG tablet; 2 tabs daily PO x 2 days then 1.5 tabs daily x 2 days then 1 tab daily x 3 days then 1/2 tab daily x 2 days  Dispense: 11 tablet; Refill: 0  6. Microscopic hematuria - Urinalysis  7. Depression, unspecified depression type Patient given printed information about services in the area including [place] on family services of the [place].  He can contact 1 of them about getting some counseling - buPROPion (ZYBAN) 150 MG 12 hr tablet; Take 1 tablet (150 mg total) by mouth 2 (two) times daily.  Dispense: 60 tablet; Refill: 3  8. Difficulty controlling anger See #7 above  9. Weight loss Discussed healthy eating habits. Encourage some form of moderate intensity exercise at least 3-4 times a week for 30 minutes - TSH  10. Screening for colon cancer - Fecal occult blood, imunochemical([facility]/Sun[facility])  11. Need for hepatitis C screening test - Hepatitis C Antibody  12. Screening for HIV (human immunodeficiency virus) - HIV Antibody (routine testing w rflx)  13. Need for Tdap vaccination Given.    Patient was given the opportunity to ask questions.  Patient verbalized understanding of the plan and was able to repeat key elements of the plan.   Orders Placed This Encounter  Procedures  . Fecal occult blood, imunochemical([facility]/Sun[facility])  . TSH  . Urinalysis  . Uric Acid  . CBC  . Comprehensive metabolic panel  . Lipid panel  . HIV Antibody (routine testing w rflx)  . Hepatitis C Antibody     Requested Prescriptions   Signed Prescriptions Disp Refills  . predniSONE (DELTASONE) 20 MG tablet 11 tablet 0    Sig: 2 tabs daily PO x 2 days then 1.5 tabs daily x 2 days then 1 tab daily x 3 days then 1/2 tab daily x 2 days  . buPROPion (ZYBAN) 150 MG 12 hr tablet 60 tablet 3    Sig: Take 1 tablet (150 mg total) by mouth 2 (two) times daily.  [name] amLODipine (NORVASC) 10 MG tablet 90 tablet 1    Sig: Take 1 tablet (10 mg total) by mouth daily.  [name] albuterol (VENTOLIN  HFA) 108 (90 Base) MCG/ACT inhaler 18 g 6    Sig: Inhale 2 puffs into the lungs every 6 (six) hours as needed for wheezing or shortness of breath.  [name] atorvastatin (LIPITOR) 40 MG tablet 30 tablet 2    Sig: Take 1 tablet (40 mg total) by mouth daily at 6 PM.  . losartan (COZAAR) 50 MG tablet 90 tablet 1    Sig: Take 1 tablet (50 mg total) by mouth daily.  . carvedilol (COREG) 25  MG tablet 180 tablet 1    Sig: Take 1 tablet (25 mg total) by mouth 2 (two) times daily with a meal.    Return in about 3 months (around 03/02/2019).  Karle Plumber, MD, FACP

## 2018-11-30 NOTE — Patient Instructions (Addendum)
We have placed her on prednisone for the acute gout attack.  Start bupropion to help with the depression and also to decrease your cravings for cigarettes.  Stop all over-the-counter pain medications as they can do damage to the kidneys.  The only medication that is safe to use would be Tylenol.  We have given you information for counseling services in the area where you can go to receive help with depression and for anger management.   Td Vaccine (Tetanus and Diphtheria): What You Need to Know 1. Why get vaccinated? Tetanus  and diphtheria are very serious diseases. They are rare in the [place] today, but people who do become infected often have severe complications. Td vaccine is used to protect adolescents and adults from both of these diseases. Both tetanus and diphtheria are infections caused by bacteria. Diphtheria spreads from person to person through coughing or sneezing. Tetanus-causing bacteria enter the body through cuts, scratches, or wounds. TETANUS (Lockjaw) causes painful muscle tightening and stiffness, usually all over the body.  It can lead to tightening of muscles in the head and neck so you can't open your mouth, swallow, or sometimes even breathe. Tetanus kills about 1 out of every 10 people who are infected even after receiving the best medical care. DIPHTHERIA can cause a thick coating to form in the back of the throat.  It can lead to breathing problems, paralysis, heart failure, and death. Before vaccines, as many as 200,000 cases of diphtheria and hundreds of cases of tetanus were reported in the [place] each year. Since vaccination began, reports of cases for both diseases have dropped by about 99%. 2. Td vaccine Td vaccine can protect adolescents and adults from tetanus and diphtheria. Td is usually given as a booster dose every 10 years but it can also be given earlier after a severe and dirty wound or burn. Another vaccine, called Tdap, which protects  against pertussis in addition to tetanus and diphtheria, is sometimes recommended instead of Td vaccine. Your doctor or the person giving you the vaccine can give you more information. Td may safely be given at the same time as other vaccines. 3. Some people should not get this vaccine  A person who has ever had a life-threatening allergic reaction after a previous dose of any tetanus or diphtheria containing vaccine, OR has a severe allergy to any part of this vaccine, should not get Td vaccine. Tell the person giving the vaccine about any severe allergies.  Talk to your doctor if you: ? had severe pain or swelling after any vaccine containing diphtheria or tetanus, ? ever had a condition called Guillain Barr Syndrome (GBS), ? aren't feeling well on the day the shot is scheduled. 4. Risks of a vaccine reaction With any medicine, including vaccines, there is a chance of side effects. These are usually mild and go away on their own. Serious reactions are also possible but are rare. Most people who get Td vaccine do not have any problems with it. Mild Problems following Td vaccine: (Did not interfere with activities)  Pain where the shot was given (about 8 people in 10)  Redness or swelling where the shot was given (about 1 person in 4)  Mild fever (rare)  Headache (about 1 person in 4)  Tiredness (about 1 person in 4) Moderate Problems following Td vaccine: (Interfered with activities, but did not require medical attention)  Fever over 102F (rare) Severe Problems following Td vaccine: (Unable to perform usual activities; required  medical attention)  Swelling, severe pain, bleeding and/or redness in the arm where the shot was given (rare). Problems that could happen after any vaccine:  People sometimes faint after a medical procedure, including vaccination. Sitting or lying down for about 15 minutes can help prevent fainting, and injuries caused by a fall. Tell your doctor if you  feel dizzy, or have vision changes or ringing in the ears.  Some people get severe pain in the shoulder and have difficulty moving the arm where a shot was given. This happens very rarely.  Any medication can cause a severe allergic reaction. Such reactions from a vaccine are very rare, estimated at fewer than 1 in a million doses, and would happen within a few minutes to a few hours after the vaccination. As with any medicine, there is a very remote chance of a vaccine causing a serious injury or death. The safety of vaccines is always being monitored. For more information, visit: http://www.aguilar.org/ 5. What if there is a serious reaction? What should I look for?  Look for anything that concerns you, such as signs of a severe allergic reaction, very high fever, or unusual behavior. Signs of a severe allergic reaction can include hives, swelling of the face and throat, difficulty breathing, a fast heartbeat, dizziness, and weakness. These would usually start a few minutes to a few hours after the vaccination. What should I do?  If you think it is a severe allergic reaction or other emergency that can't wait, call 9-1-1 or get the person to the nearest hospital. Otherwise, call your doctor.  Afterward, the reaction should be reported to the Vaccine Adverse Event Reporting System (VAERS). Your doctor might file this report, or you can do it yourself through the VAERS web site at www.vaers.SamedayNews.es, or by calling 867-089-7105. VAERS does not give medical advice. 6. The National Vaccine Injury Compensation Program The [employer] Vaccine Injury Compensation Program (VICP) is a federal program that was created to compensate people who may have been injured by certain vaccines. Persons who believe they may have been injured by a vaccine can learn about the program and about filing a claim by calling 8486007375 or visiting the [place] website at GoldCloset.com.ee. There is a time  limit to file a claim for compensation. 7. How can I learn more?  Ask your doctor. He or she can give you the vaccine package insert or suggest other sources of information.  Call your local or state health department.  Contact the Centers for Disease Control and Prevention (CDC): ? Call 206-576-2357 (1-800-CDC-INFO) ? Visit CDC's website at http://hunter.com/ Vaccine Information Statement Td Vaccine (09/23/15) This information is not intended to replace advice given to you by your health care provider. Make sure you discuss any questions you have with your health care provider. Document Released: 03/28/2006 Document Revised: 01/16/2018 Document Reviewed: 01/16/2018 Elsevier Interactive Patient Education  2019 [employer].

## 2018-12-01 ENCOUNTER — Telehealth: Payer: Self-pay | Admitting: Internal Medicine

## 2018-12-01 DIAGNOSIS — E875 Hyperkalemia: Secondary | ICD-10-CM

## 2018-12-01 LAB — COMPREHENSIVE METABOLIC PANEL
ALT: 18 IU/L (ref 0–44)
AST: 18 IU/L (ref 0–40)
Albumin/Globulin Ratio: 1.3 (ref 1.2–2.2)
Albumin: 3.7 g/dL — ABNORMAL LOW (ref 3.8–4.9)
Alkaline Phosphatase: 69 IU/L (ref 39–117)
BUN/Creatinine Ratio: 17 (ref 9–20)
BUN: 29 mg/dL — ABNORMAL HIGH (ref 6–24)
Bilirubin Total: 0.2 mg/dL (ref 0.0–1.2)
CO2: 24 mmol/L (ref 20–29)
Calcium: 10 mg/dL (ref 8.7–10.2)
Chloride: 103 mmol/L (ref 96–106)
Creatinine, Ser: 1.75 mg/dL — ABNORMAL HIGH (ref 0.76–1.27)
GFR calc Af Amer: 49 mL/min/{1.73_m2} — ABNORMAL LOW (ref >59)
GFR calc non Af Amer: 43 mL/min/{1.73_m2} — ABNORMAL LOW (ref >59)
Globulin, Total: 2.8 g/dL (ref 1.5–4.5)
Glucose: 82 mg/dL (ref 65–99)
Potassium: 5.3 mmol/L — ABNORMAL HIGH (ref 3.5–5.2)
Sodium: 138 mmol/L (ref 134–144)
Total Protein: 6.5 g/dL (ref 6.0–8.5)

## 2018-12-01 LAB — LIPID PANEL
Chol/HDL Ratio: 5.8 ratio — ABNORMAL HIGH (ref 0.0–5.0)
Cholesterol, Total: 197 mg/dL (ref 100–199)
HDL: 34 mg/dL — ABNORMAL LOW (ref >39)
LDL Calculated: 141 mg/dL — ABNORMAL HIGH (ref 0–99)
Triglycerides: 108 mg/dL (ref 0–149)
VLDL Cholesterol Cal: 22 mg/dL (ref 5–40)

## 2018-12-01 LAB — CBC
Hematocrit: 44 % (ref 37.5–51.0)
Hemoglobin: 14.7 g/dL (ref 13.0–17.7)
MCH: 30.4 pg (ref 26.6–33.0)
MCHC: 33.4 g/dL (ref 31.5–35.7)
MCV: 91 fL (ref 79–97)
Platelets: 185 10*3/uL (ref 150–450)
RBC: 4.84 x10E6/uL (ref 4.14–5.80)
RDW: 14.6 % (ref 11.6–15.4)
WBC: 9.5 10*3/uL (ref 3.4–10.8)

## 2018-12-01 LAB — URINALYSIS
Bilirubin, UA: NEGATIVE
Glucose, UA: NEGATIVE
Ketones, UA: NEGATIVE
Leukocytes,UA: NEGATIVE
Nitrite, UA: NEGATIVE
Specific Gravity, UA: 1.009 (ref 1.005–1.030)
Urobilinogen, Ur: 0.2 mg/dL (ref 0.2–1.0)
pH, UA: 5.5 (ref 5.0–7.5)

## 2018-12-01 LAB — HIV ANTIBODY (ROUTINE TESTING W REFLEX): HIV Screen 4th Generation wRfx: NONREACTIVE

## 2018-12-01 LAB — HEPATITIS C ANTIBODY: Hep C Virus Ab: <0.1 s/co ratio (ref 0.0–0.9)

## 2018-12-01 LAB — URIC ACID: Uric Acid: 7.5 mg/dL (ref 3.7–8.6)

## 2018-12-01 LAB — TSH: TSH: 0.842 u[IU]/mL (ref 0.450–4.500)

## 2018-12-04 NOTE — Telephone Encounter (Signed)
Contacted pt to go over lab results pt didn't answer lvm asking pt to give me a call at his earliest convenience  

## 2018-12-06 ENCOUNTER — Telehealth: Payer: Self-pay | Admitting: Internal Medicine

## 2018-12-06 NOTE — Telephone Encounter (Signed)
Patient came in to the facility stating that the goat medication that was prescribed to him is not working. Patient would like to be prescribed something else. Patient uses [facility] pharmacy. Please f/u

## 2018-12-06 NOTE — Telephone Encounter (Signed)
Pt states that he is having problems with his medication (gout) prednisone..please follow up

## 2018-12-06 NOTE — Telephone Encounter (Signed)
Patients call returned.  Patient identified by name and date of birth.  Patient states that the prednisone did not help his gout in his foot and would like another more effective medication.  Patient advised that PCP would be informend and any information would be passed on.  Patient acknowledged understanding of advice.

## 2018-12-07 MED ORDER — COLCHICINE 0.6 MG PO TABS: 0.3000 mg | ORAL_TABLET | Freq: Every day | ORAL | 0 refills | Status: DC

## 2018-12-07 MED FILL — COLCHICINE 0.6 MG TABS: 0.6 | 30 days supply | Qty: 15 | Fill #0

## 2018-12-07 NOTE — Telephone Encounter (Signed)
Pt informed medication for gout was at the pharmacy.

## 2018-12-07 NOTE — Addendum Note (Signed)
Addended by: Karle Plumber B on: 12/07/2018 10:11 AM   Modules accepted: Orders

## 2018-12-07 NOTE — Telephone Encounter (Signed)
Pt name and DOB verified. Patient aware of results and result note per Dr. Wynetta Emery. Aware to get lab test when he comes to pick up medication today. Pt verbalized understanding.

## 2018-12-20 ENCOUNTER — Other Ambulatory Visit: Payer: Self-pay | Admitting: Internal Medicine

## 2018-12-20 NOTE — Telephone Encounter (Signed)
Patient called stating he accidentally threw away his gout medication. Patient would like to know if he can get another bottle. Please follow up. Patient states he is in a lot of pain and does not have any.

## 2018-12-21 MED FILL — !COLCRYS 0.6 MG TABLET: 0.6 MG | 30 days supply | Qty: 15 | Fill #1

## 2018-12-21 NOTE — Telephone Encounter (Signed)
Pt called Again to request -colchicine 0.6 MG tablet  To -[place] [address] [place]Please follow up as soon as possible because he states his foot is now swollen

## 2018-12-21 NOTE — Telephone Encounter (Signed)
rx refilled early at the pharmacy.

## 2019-01-08 ENCOUNTER — Other Ambulatory Visit: Payer: Self-pay | Admitting: Internal Medicine

## 2019-01-08 MED FILL — ?AMLODIPINE BESYLATE 10 MG: 10 | 30 days supply | Qty: 30 | Fill #1

## 2019-01-08 MED FILL — ?CARVEDILOL 25 MG TABLET: 25 | 30 days supply | Qty: 60 | Fill #1

## 2019-01-08 MED FILL — LOSARTAN POTASSIUM 50 MG TA: 50 | 30 days supply | Qty: 30 | Fill #1

## 2019-01-08 MED FILL — SILDENAFIL CITRATE 20 MG TA: 20 | 30 days supply | Qty: 90 | Fill #0

## 2019-01-08 MED FILL — ?ATORVASTATIN 40MG TABLET: 40 | 30 days supply | Qty: 30 | Fill #1

## 2019-01-09 MED FILL — !VENTOLIN HFA INHALER: 108 (90 BAS | 25 days supply | Qty: 18 | Fill #1

## 2019-01-23 MED FILL — COLCHICINE 0.6 MG TABS: 0.6 | 30 days supply | Qty: 15 | Fill #0

## 2019-02-01 ENCOUNTER — Other Ambulatory Visit: Payer: Self-pay

## 2019-02-01 ENCOUNTER — Encounter: Payer: Self-pay | Admitting: Internal Medicine

## 2019-02-01 ENCOUNTER — Ambulatory Visit: Payer: Medicaid Other | Attending: Internal Medicine | Admitting: Internal Medicine

## 2019-02-01 DIAGNOSIS — N183 Chronic kidney disease, stage 3 (moderate): Secondary | ICD-10-CM | POA: Diagnosis not present

## 2019-02-01 DIAGNOSIS — Z79899 Other long term (current) drug therapy: Secondary | ICD-10-CM | POA: Insufficient documentation

## 2019-02-01 DIAGNOSIS — M109 Gout, unspecified: Secondary | ICD-10-CM | POA: Insufficient documentation

## 2019-02-01 DIAGNOSIS — Z8739 Personal history of other diseases of the musculoskeletal system and connective tissue: Secondary | ICD-10-CM

## 2019-02-01 DIAGNOSIS — Z7952 Long term (current) use of systemic steroids: Secondary | ICD-10-CM | POA: Insufficient documentation

## 2019-02-01 DIAGNOSIS — I10 Essential (primary) hypertension: Secondary | ICD-10-CM

## 2019-02-01 DIAGNOSIS — J301 Allergic rhinitis due to pollen: Secondary | ICD-10-CM | POA: Diagnosis not present

## 2019-02-01 DIAGNOSIS — E875 Hyperkalemia: Secondary | ICD-10-CM | POA: Diagnosis not present

## 2019-02-01 DIAGNOSIS — J449 Chronic obstructive pulmonary disease, unspecified: Secondary | ICD-10-CM | POA: Diagnosis not present

## 2019-02-01 DIAGNOSIS — F17200 Nicotine dependence, unspecified, uncomplicated: Secondary | ICD-10-CM | POA: Diagnosis not present

## 2019-02-01 DIAGNOSIS — I129 Hypertensive chronic kidney disease with stage 1 through stage 4 chronic kidney disease, or unspecified chronic kidney disease: Secondary | ICD-10-CM | POA: Insufficient documentation

## 2019-02-01 MED ORDER — ALLOPURINOL 100 MG PO TABS: 100.0000 mg | ORAL_TABLET | Freq: Every day | ORAL | 6 refills | Status: DC

## 2019-02-01 MED ORDER — FLUTICASONE PROPIONATE 50 MCG/ACT NA SUSP: 1.0000 | Freq: Every day | NASAL | 1 refills | Status: DC | PRN

## 2019-02-01 MED ORDER — LORATADINE 10 MG PO TABS: 10.0000 mg | ORAL_TABLET | Freq: Every day | ORAL | 2 refills | Status: DC

## 2019-02-01 MED ORDER — PREDNISONE 20 MG PO TABS: ORAL_TABLET | ORAL | 0 refills | Status: DC

## 2019-02-01 NOTE — Progress Notes (Signed)
Virtual Visit via Telephone Note Due to current restrictions/limitations of in-office visits due to the COVID-19 pandemic, this scheduled clinical appointment was converted to a telehealth visit  I connected with Chris [name] on 02/01/19 at 12:06 p.m by telephone and verified that I am speaking with the correct person using two identifiers. I am in my office.  The patient is at home.  Only the patient and myself participated in this encounter.  I discussed the limitations, risks, security and privacy concerns of performing an evaluation and management service by telephone and the availability of in person appointments. I also discussed with the patient that there may be a patient responsible charge related to this service. The patient expressed understanding and agreed to proceed.   History of Present Illness: Pt with hx of HTN, HL, CKD 3, gout, tob dep, subst abuse, ? COPD.  Last seen 11/2018 in person  HTN: reports compliance with meds which are amlodipine, carvedilol and Cozaar..  K+ level 5.3 on blood test done in June. he was suppose to come to lab to have recheck but has not done so yet.  BP yesterday was 145/90 at [facility] He limits salt in foods No CP/SOB/LE edema  Gout:  Reports he still gets frequent flare of gout despite being on colchicine.  Uric acid level was 7.5  C/o problems with allergies for several weeks.  Symptoms include rhinorrhea, lacrimation, itchy throat, sinus congestion.  Using an over-the-counter medication that is not helping.  He does not recall the name.  Tob dep:  Filled the Zyban but not taking it.  He tells me that he has cut back from 1 pk/day to 1 pk/Q3 days.  Current Outpatient Medications  Medication Instructions  . albuterol (VENTOLIN HFA) 108 (90 Base) MCG/ACT inhaler 2 puffs, Inhalation, Every 6 hours PRN  . amLODipine (NORVASC) 10 mg, Oral, Daily  . atorvastatin (LIPITOR) 40 mg, Oral, Daily-1800  . buPROPion (ZYBAN) 150 mg, Oral, 2 times  daily  . carvedilol (COREG) 25 mg, Oral, 2 times daily with meals  . colchicine 0.3 mg, Oral, Daily  . losartan (COZAAR) 50 mg, Oral, Daily  . montelukast (SINGULAIR) 10 mg, Oral, Daily at bedtime  . predniSONE (DELTASONE) 20 MG tablet 2 tabs daily PO x 2 days then 1.5 tabs daily x 2 days then 1 tab daily x 3 days then 1/2 tab daily x 2 days      Observations/Objective: Results for orders placed or performed in visit on 11/30/18  TSH  Result Value Ref Range   TSH 0.842 0.450 - 4.500 uIU/mL  Urinalysis  Result Value Ref Range   Specific Gravity, UA 1.009 1.005 - 1.030   pH, UA 5.5 5.0 - 7.5   Color, UA Yellow Yellow   Appearance Ur Clear Clear   Leukocytes,UA Negative Negative   Protein,UA 1+ (A) Negative/Trace   Glucose, UA Negative Negative   Ketones, UA Negative Negative   RBC, UA Trace (A) Negative   Bilirubin, UA Negative Negative   Urobilinogen, Ur 0.2 0.2 - 1.0 mg/dL   Nitrite, UA Negative Negative  Uric Acid  Result Value Ref Range   Uric Acid 7.5 3.7 - 8.6 mg/dL  CBC  Result Value Ref Range   WBC 9.5 3.4 - 10.8 x10E3/uL   RBC 4.84 4.14 - 5.80 x10E6/uL   Hemoglobin 14.7 13.0 - 17.7 g/dL   Hematocrit 44.0 37.5 - 51.0 %   MCV 91 79 - 97 fL   MCH 30.4 26.6 -  33.0 pg   MCHC 33.4 31.5 - 35.7 g/dL   RDW 14.6 11.6 - 15.4 %   Platelets 185 150 - 450 x10E3/uL  Comprehensive metabolic panel  Result Value Ref Range   Glucose 82 65 - 99 mg/dL   BUN 29 (H) 6 - 24 mg/dL   Creatinine, Ser 1.75 (H) 0.76 - 1.27 mg/dL   GFR calc non Af Amer 43 (L) >59 mL/min/1.73   GFR calc Af Amer 49 (L) >59 mL/min/1.73   BUN/Creatinine Ratio 17 9 - 20   Sodium 138 134 - 144 mmol/L   Potassium 5.3 (H) 3.5 - 5.2 mmol/L   Chloride 103 96 - 106 mmol/L   CO2 24 20 - 29 mmol/L   Calcium 10.0 8.7 - 10.2 mg/dL   Total Protein 6.5 6.0 - 8.5 g/dL   Albumin 3.7 (L) 3.8 - 4.9 g/dL   Globulin, Total 2.8 1.5 - 4.5 g/dL   Albumin/Globulin Ratio 1.3 1.2 - 2.2   Bilirubin Total 0.2 0.0 - 1.2 mg/dL    Alkaline Phosphatase 69 39 - 117 IU/L   AST 18 0 - 40 IU/L   ALT 18 0 - 44 IU/L  Lipid panel  Result Value Ref Range   Cholesterol, Total 197 100 - 199 mg/dL   Triglycerides 108 0 - 149 mg/dL   HDL 34 (L) >39 mg/dL   VLDL Cholesterol Cal 22 5 - 40 mg/dL   LDL Calculated 141 (H) 0 - 99 mg/dL   Chol/HDL Ratio 5.8 (H) 0.0 - 5.0 ratio  HIV Antibody (routine testing w rflx)  Result Value Ref Range   HIV Screen 4th Generation wRfx Non Reactive Non Reactive  Hepatitis C Antibody  Result Value Ref Range   Hep C Virus Ab <0.1 0.0 - 0.9 s/co ratio     Assessment and Plan: 1. Essential hypertension Blood pressure not at goal.  I would like to increase the Cozaar from 50 mg daily to 100 mg daily.  However I would like for him to come to the lab to have the potassium level rechecked first.  If potassium level is good we will go ahead and increase the Cozaar.  If it is not then we probably will increase carvedilol  2. History of gout He now has Medicaid.  Will refer him to rheumatology.  He will continue the low-dose of colchicine given his kidney function.  Refill prednisone to use when needed. -I have also recommended starting low-dose allopurinol which we can then titrate.  Patient advised that the medication sometimes can produce severe reaction in the form of a rash.  Should this occur he needs to stop the medication and come in immediately.  He expressed understanding. - predniSONE (DELTASONE) 20 MG tablet; 2 tabs daily PO x 2 days then 1.5 tabs daily x 2 days then 1 tab daily x 3 days then 1/2 tab daily x 2 days. For gout flare  Dispense: 11 tablet; Refill: 0  3. Hyperkalemia Patient plans to come to the lab next week to have the potassium level checked  4. Seasonal allergic rhinitis due to pollen Start Claritin and Flonase  5. Tobacco dependence Commended him on cutting back.  Encouraged him to set a quit date.  Less than 5 minutes spent on counseling   Follow Up  Instructions: Follow-up in 2 to 3 months   I discussed the assessment and treatment plan with the patient. The patient was provided an opportunity to ask questions and all were answered. The  patient agreed with the plan and demonstrated an understanding of the instructions.   The patient was advised to call back or seek an in-person evaluation if the symptoms worsen or if the condition fails to improve as anticipated.  I provided 15 minutes of non-face-to-face time during this encounter.   Karle Plumber, MD

## 2019-02-02 MED FILL — ALLOPURINOL 100 MG TABLET: 100 | 30 days supply | Qty: 30 | Fill #0

## 2019-02-02 MED FILL — predniSONE 20 MG TABS: 20 | 9 days supply | Qty: 11 | Fill #0

## 2019-02-02 MED FILL — FLUTICASONE PROP 50 MCG SPR: 50 | 30 days supply | Qty: 16 | Fill #0

## 2019-03-09 MED FILL — SILDENAFIL CITRATE 20 MG TA: 20 | 30 days supply | Qty: 90 | Fill #1

## 2019-04-03 MED FILL — AMLODIPINE BESYLATE 10 MG T: 10 | 30 days supply | Qty: 30 | Fill #2

## 2019-04-03 MED FILL — ATORVASTATIN CALCIUM 40 MG: 40 | 30 days supply | Qty: 30 | Fill #2

## 2019-04-03 MED FILL — ALLOPURINOL 100 MG TABLET: 100 | 30 days supply | Qty: 30 | Fill #1

## 2019-04-04 ENCOUNTER — Ambulatory Visit: Payer: Medicaid Other | Attending: Family Medicine | Admitting: Physician Assistant

## 2019-04-04 ENCOUNTER — Other Ambulatory Visit: Payer: Self-pay

## 2019-04-04 DIAGNOSIS — F1410 Cocaine abuse, uncomplicated: Secondary | ICD-10-CM

## 2019-04-04 DIAGNOSIS — E875 Hyperkalemia: Secondary | ICD-10-CM

## 2019-04-04 DIAGNOSIS — Z8739 Personal history of other diseases of the musculoskeletal system and connective tissue: Secondary | ICD-10-CM

## 2019-04-04 DIAGNOSIS — F419 Anxiety disorder, unspecified: Secondary | ICD-10-CM | POA: Diagnosis not present

## 2019-04-04 DIAGNOSIS — F329 Major depressive disorder, single episode, unspecified: Secondary | ICD-10-CM

## 2019-04-04 DIAGNOSIS — E785 Hyperlipidemia, unspecified: Secondary | ICD-10-CM

## 2019-04-04 DIAGNOSIS — F32A Depression, unspecified: Secondary | ICD-10-CM

## 2019-04-04 DIAGNOSIS — I10 Essential (primary) hypertension: Secondary | ICD-10-CM

## 2019-04-04 MED ORDER — COLCHICINE 0.6 MG PO TABS: 0.3000 mg | ORAL_TABLET | Freq: Every day | ORAL | 3 refills | Status: DC

## 2019-04-04 MED ORDER — ATORVASTATIN CALCIUM 40 MG PO TABS: 40.0000 mg | ORAL_TABLET | Freq: Every day | ORAL | 2 refills | Status: DC

## 2019-04-04 MED ORDER — ALBUTEROL SULFATE HFA 108 (90 BASE) MCG/ACT IN AERS: 2.0000 | INHALATION_SPRAY | Freq: Four times a day (QID) | RESPIRATORY_TRACT | 6 refills | Status: DC | PRN

## 2019-04-04 MED ORDER — AMLODIPINE BESYLATE 10 MG PO TABS: 10.0000 mg | ORAL_TABLET | Freq: Every day | ORAL | 1 refills | Status: DC

## 2019-04-04 MED ORDER — CARVEDILOL 25 MG PO TABS: 25.0000 mg | ORAL_TABLET | Freq: Two times a day (BID) | ORAL | 1 refills | Status: DC

## 2019-04-04 MED ORDER — PREDNISONE 20 MG PO TABS: ORAL_TABLET | ORAL | 0 refills | Status: DC

## 2019-04-04 MED ORDER — ALLOPURINOL 100 MG PO TABS: 100.0000 mg | ORAL_TABLET | Freq: Every day | ORAL | 6 refills | Status: DC

## 2019-04-04 MED ORDER — LOSARTAN POTASSIUM 50 MG PO TABS: 50.0000 mg | ORAL_TABLET | Freq: Every day | ORAL | 1 refills | Status: DC

## 2019-04-04 MED FILL — LOSARTAN POTASSIUM 50 MG TA: 50 | 90 days supply | Qty: 90 | Fill #0

## 2019-04-04 MED FILL — CARVEDILOL 25 MG TABLET: 25 | 90 days supply | Qty: 180 | Fill #0

## 2019-04-04 MED FILL — VENTOLIN HFA 90 MCG INHALER: 108 (90 BAS | 25 days supply | Qty: 18 | Fill #0

## 2019-04-04 MED FILL — predniSONE 20 MG TABS: 20 | 9 days supply | Qty: 11 | Fill #0

## 2019-04-04 NOTE — Progress Notes (Signed)
Virtual Visit via Telephone Note  I connected with Chris [name] on 04/04/19 at  2:10 PM EDT by telephone and verified that I am speaking with the correct person using two identifiers.   I discussed the limitations, risks, security and privacy concerns of performing an evaluation and management service by telephone and the availability of in person appointments. I also discussed with the patient that there may be a patient responsible charge related to this service. The patient expressed understanding and agreed to proceed.  Patient location:  home My Location:  [place] office Persons on the call:  Me and the patient    History of Present Illness:  Patient is out of meds and needs RF.  Also currently having a flare of gout and needs prednisone.  He admits to cocaine use at times.  He has anxiety and depression.  He feels someone is out to get him at times when he is trying to fall asleep.  He denies SI/HI.      Observations/Objective:  NAD.  A&Ox3   Assessment and Plan: 1. Essential hypertension Resume medications.  Check BP OOO and record 3 times weekly - Comprehensive metabolic panel; Future - amLODipine (NORVASC) 10 MG tablet; Take 1 tablet (10 mg total) by mouth daily.  Dispense: 90 tablet; Refill: 1 - losartan (COZAAR) 50 MG tablet; Take 1 tablet (50 mg total) by mouth daily.  Dispense: 90 tablet; Refill: 1 - carvedilol (COREG) 25 MG tablet; Take 1 tablet (25 mg total) by mouth 2 (two) times daily with a meal.  Dispense: 180 tablet; Refill: 1  2. History of gout - allopurinol (ZYLOPRIM) 100 MG tablet; Take 1 tablet (100 mg total) by mouth daily.  Dispense: 30 tablet; Refill: 6(hold with acute flare and resume once resolved-he has been out of allopurinol and not taking it) - colchicine 0.6 MG tablet; Take 0.5 tablets (0.3 mg total) by mouth daily.  Dispense: 30 tablet; Refill: 3 - predniSONE (DELTASONE) 20 MG tablet; 2 tabs daily PO x 2 days then 1.5 tabs daily x 2 days then 1 tab  daily x 3 days then 1/2 tab daily x 2 days. For gout flare  Dispense: 11 tablet; Refill: 0  3. Hyperkalemia - Comprehensive metabolic panel; Future  4. Hyperlipidemia, unspecified hyperlipidemia type - atorvastatin (LIPITOR) 40 MG tablet; Take 1 tablet (40 mg total) by mouth daily at 6 PM.  Dispense: 30 tablet; Refill: 2  5. Anxiety Complicated by #7 - Ambulatory referral to Social Work - Ambulatory referral to Psychiatry  6. Depression, unspecified depression type Complicated by #7 - Ambulatory referral to Social Work - Ambulatory referral to Psychiatry  7. Cocaine abuse ([place]) I have counseled the patient at length about substance abuse and addiction.  12 step meetings/recovery recommended.  Local 12 step meeting lists were given and attendance was encouraged.  Patient expresses understanding.  - Ambulatory referral to Social Work - Ambulatory referral to Psychiatry    Follow Up Instructions: 3 weeks with [name] for BP check and 3 months with PCP   I discussed the assessment and treatment plan with the patient. The patient was provided an opportunity to ask questions and all were answered. The patient agreed with the plan and demonstrated an understanding of the instructions.   The patient was advised to call back or seek an in-person evaluation if the symptoms worsen or if the condition fails to improve as anticipated.  I provided 15 minutes of non-face-to-face time during this encounter.   Levada Dy  Thereasa Solo, PA-C  Patient ID: Chris [name], male   DOB: October 02, 1960, 58 y.o.   MRN: [id number]

## 2019-04-04 NOTE — Progress Notes (Signed)
Med refills and per pt his gout is messing up really bad and need Prednisone refills.   Need referral to psych

## 2019-04-05 ENCOUNTER — Other Ambulatory Visit: Payer: Medicaid Other

## 2019-04-10 ENCOUNTER — Ambulatory Visit: Payer: Medicaid Other | Admitting: Internal Medicine

## 2019-04-10 MED FILL — SILDENAFIL CITRATE 20 MG TA: 20 | 30 days supply | Qty: 90 | Fill #2

## 2019-04-23 ENCOUNTER — Encounter: Payer: Medicaid Other | Admitting: Licensed Clinical Social Worker

## 2019-05-23 ENCOUNTER — Other Ambulatory Visit: Payer: Self-pay

## 2019-05-23 ENCOUNTER — Ambulatory Visit: Payer: Medicaid Other | Attending: Internal Medicine | Admitting: Physician Assistant

## 2019-05-23 NOTE — Progress Notes (Deleted)
Virtual Visit via Telephone Note  I connected with Chris [name] on 05/23/19 at  8:30 AM EST by telephone and verified that I am speaking with the correct person using two identifiers.   I discussed the limitations, risks, security and privacy concerns of performing an evaluation and management service by telephone and the availability of in person appointments. I also discussed with the patient that there may be a patient responsible charge related to this service. The patient expressed understanding and agreed to proceed.  Patient location: My Location:  [place] office Persons on the call:    History of Present Illness:    Observations/Objective:   Assessment and Plan:   Follow Up Instructions:    I discussed the assessment and treatment plan with the patient. The patient was provided an opportunity to ask questions and all were answered. The patient agreed with the plan and demonstrated an understanding of the instructions.   The patient was advised to call back or seek an in-person evaluation if the symptoms worsen or if the condition fails to improve as anticipated.  I provided *** minutes of non-face-to-face time during this encounter.   Freeman Caldron, PA-C  Patient ID: Chris [name], male   DOB: 1961/01/06, 58 y.o.   MRN: [id number]

## 2019-06-12 ENCOUNTER — Telehealth: Payer: Self-pay | Admitting: Internal Medicine

## 2019-06-12 MED FILL — ALLOPURINOL 100 MG TABLET: 100 | 30 days supply | Qty: 30 | Fill #0

## 2019-06-12 NOTE — Telephone Encounter (Signed)
Refills were available in our pharmacy. Refills ordered.

## 2019-06-12 NOTE — Telephone Encounter (Signed)
1) Medication(s) Requested (by name):Gout meds   2) Pharmacy of Choice:[facility]  3) Special Requests:   Approved medications will be sent to the pharmacy, we will reach out if there is an issue.  Requests made after 3pm may not be addressed until the following business day!  If a patient is unsure of the name of the medication(s) please note and ask patient to call back when they are able to provide all info, do not send to responsible party until all information is available!

## 2019-06-13 ENCOUNTER — Ambulatory Visit: Payer: Medicaid Other

## 2019-06-14 ENCOUNTER — Other Ambulatory Visit: Payer: Self-pay | Admitting: Physician Assistant

## 2019-06-14 DIAGNOSIS — Z8739 Personal history of other diseases of the musculoskeletal system and connective tissue: Secondary | ICD-10-CM

## 2019-06-14 MED ORDER — COLCHICINE 0.6 MG PO TABS: 0.6000 mg | ORAL_TABLET | Freq: Every day | ORAL | 3 refills | Status: DC

## 2019-06-14 MED FILL — MITIGARE 0.6 MG CAPSULE: 0.6 | 30 days supply | Qty: 30 | Fill #0

## 2019-07-05 ENCOUNTER — Ambulatory Visit: Payer: Medicaid Other

## 2019-07-09 ENCOUNTER — Encounter: Payer: Self-pay | Admitting: Gastroenterology

## 2019-07-09 ENCOUNTER — Ambulatory Visit: Payer: Medicaid Other | Attending: Nurse Practitioner | Admitting: Nurse Practitioner

## 2019-07-09 ENCOUNTER — Encounter: Payer: Self-pay | Admitting: Nurse Practitioner

## 2019-07-09 ENCOUNTER — Other Ambulatory Visit: Payer: Self-pay

## 2019-07-09 DIAGNOSIS — J209 Acute bronchitis, unspecified: Secondary | ICD-10-CM | POA: Diagnosis not present

## 2019-07-09 DIAGNOSIS — Z131 Encounter for screening for diabetes mellitus: Secondary | ICD-10-CM | POA: Diagnosis not present

## 2019-07-09 DIAGNOSIS — F329 Major depressive disorder, single episode, unspecified: Secondary | ICD-10-CM | POA: Diagnosis not present

## 2019-07-09 DIAGNOSIS — I509 Heart failure, unspecified: Secondary | ICD-10-CM | POA: Insufficient documentation

## 2019-07-09 DIAGNOSIS — F17210 Nicotine dependence, cigarettes, uncomplicated: Secondary | ICD-10-CM | POA: Insufficient documentation

## 2019-07-09 DIAGNOSIS — Z8739 Personal history of other diseases of the musculoskeletal system and connective tissue: Secondary | ICD-10-CM

## 2019-07-09 DIAGNOSIS — D696 Thrombocytopenia, unspecified: Secondary | ICD-10-CM | POA: Diagnosis not present

## 2019-07-09 DIAGNOSIS — J449 Chronic obstructive pulmonary disease, unspecified: Secondary | ICD-10-CM | POA: Diagnosis present

## 2019-07-09 DIAGNOSIS — Z125 Encounter for screening for malignant neoplasm of prostate: Secondary | ICD-10-CM | POA: Insufficient documentation

## 2019-07-09 DIAGNOSIS — J302 Other seasonal allergic rhinitis: Secondary | ICD-10-CM | POA: Insufficient documentation

## 2019-07-09 DIAGNOSIS — I10 Essential (primary) hypertension: Secondary | ICD-10-CM

## 2019-07-09 DIAGNOSIS — J210 Acute bronchiolitis due to respiratory syncytial virus: Secondary | ICD-10-CM | POA: Diagnosis not present

## 2019-07-09 DIAGNOSIS — Z8249 Family history of ischemic heart disease and other diseases of the circulatory system: Secondary | ICD-10-CM | POA: Insufficient documentation

## 2019-07-09 DIAGNOSIS — J440 Chronic obstructive pulmonary disease with acute lower respiratory infection: Secondary | ICD-10-CM

## 2019-07-09 DIAGNOSIS — I130 Hypertensive heart and chronic kidney disease with heart failure and stage 1 through stage 4 chronic kidney disease, or unspecified chronic kidney disease: Secondary | ICD-10-CM | POA: Insufficient documentation

## 2019-07-09 DIAGNOSIS — Z1211 Encounter for screening for malignant neoplasm of colon: Secondary | ICD-10-CM

## 2019-07-09 DIAGNOSIS — J301 Allergic rhinitis due to pollen: Secondary | ICD-10-CM | POA: Diagnosis not present

## 2019-07-09 DIAGNOSIS — M109 Gout, unspecified: Secondary | ICD-10-CM | POA: Diagnosis not present

## 2019-07-09 DIAGNOSIS — Z79899 Other long term (current) drug therapy: Secondary | ICD-10-CM | POA: Insufficient documentation

## 2019-07-09 DIAGNOSIS — I5022 Chronic systolic (congestive) heart failure: Secondary | ICD-10-CM | POA: Diagnosis not present

## 2019-07-09 DIAGNOSIS — N189 Chronic kidney disease, unspecified: Secondary | ICD-10-CM | POA: Insufficient documentation

## 2019-07-09 DIAGNOSIS — Z7951 Long term (current) use of inhaled steroids: Secondary | ICD-10-CM | POA: Insufficient documentation

## 2019-07-09 DIAGNOSIS — E785 Hyperlipidemia, unspecified: Secondary | ICD-10-CM | POA: Diagnosis not present

## 2019-07-09 MED ORDER — MONTELUKAST SODIUM 10 MG PO TABS: 10.0000 mg | ORAL_TABLET | Freq: Every day | ORAL | 1 refills | Status: DC

## 2019-07-09 MED ORDER — ALBUTEROL SULFATE (2.5 MG/3ML) 0.083% IN NEBU: 2.5000 mg | INHALATION_SOLUTION | Freq: Four times a day (QID) | RESPIRATORY_TRACT | 1 refills | Status: DC | PRN

## 2019-07-09 MED ORDER — AMLODIPINE BESYLATE 10 MG PO TABS: 10.0000 mg | ORAL_TABLET | Freq: Every day | ORAL | 0 refills | Status: DC

## 2019-07-09 MED ORDER — BUDESONIDE-FORMOTEROL FUMARATE 160-4.5 MCG/ACT IN AERO: 2.0000 | INHALATION_SPRAY | Freq: Two times a day (BID) | RESPIRATORY_TRACT | 3 refills | Status: DC

## 2019-07-09 MED ORDER — ALBUTEROL SULFATE HFA 108 (90 BASE) MCG/ACT IN AERS: 2.0000 | INHALATION_SPRAY | Freq: Four times a day (QID) | RESPIRATORY_TRACT | 1 refills | Status: DC | PRN

## 2019-07-09 MED ORDER — ATORVASTATIN CALCIUM 40 MG PO TABS: 40.0000 mg | ORAL_TABLET | Freq: Every day | ORAL | 2 refills | Status: DC

## 2019-07-09 MED ORDER — BLOOD PRESSURE MONITOR DEVI: 0 refills | Status: DC

## 2019-07-09 MED ORDER — LORATADINE 10 MG PO TABS: 10.0000 mg | ORAL_TABLET | Freq: Every day | ORAL | 2 refills | Status: DC

## 2019-07-09 MED ORDER — PREDNISONE 20 MG PO TABS: 20.0000 mg | ORAL_TABLET | Freq: Every day | ORAL | 0 refills | Status: AC

## 2019-07-09 MED ORDER — CARVEDILOL 25 MG PO TABS: 25.0000 mg | ORAL_TABLET | Freq: Two times a day (BID) | ORAL | 0 refills | Status: DC

## 2019-07-09 MED ORDER — ALLOPURINOL 100 MG PO TABS: 100.0000 mg | ORAL_TABLET | Freq: Every day | ORAL | 6 refills | Status: DC

## 2019-07-09 MED ORDER — LOSARTAN POTASSIUM 50 MG PO TABS: 50.0000 mg | ORAL_TABLET | Freq: Every day | ORAL | 1 refills | Status: DC

## 2019-07-09 MED FILL — LORATADINE 10 MG TABLET: 10 | 30 days supply | Qty: 30 | Fill #0

## 2019-07-09 MED FILL — SYMBICORT 160-4.5 MCG INH: 160-4.5 | 30 days supply | Qty: 10 | Fill #0

## 2019-07-09 MED FILL — predniSONE 20 MG TABS: 20 | 5 days supply | Qty: 5 | Fill #0

## 2019-07-09 MED FILL — ALLOPURINOL 100 MG TABLET: 100 | 90 days supply | Qty: 90 | Fill #0

## 2019-07-09 MED FILL — ATORVASTATIN CALCIUM 40 MG: 40 | 90 days supply | Qty: 90 | Fill #0

## 2019-07-09 MED FILL — ALBUTEROL SULFATE HFA 108 (: 108 (90 BAS | 25 days supply | Qty: 18 | Fill #0

## 2019-07-09 MED FILL — MONTELUKAST SOD 10 MG TAB: 10 | 90 days supply | Qty: 90 | Fill #0

## 2019-07-09 MED FILL — ALBUTEROL SUL 2.5 MG/3 ML S: (2.5 MG/3ML | 12 days supply | Qty: 150 | Fill #0

## 2019-07-09 MED FILL — AMLODIPINE BESYLATE 10 MG T: 10 | 90 days supply | Qty: 90 | Fill #0

## 2019-07-09 MED FILL — CARVEDILOL 25 MG TABLET: 25 | 60 days supply | Qty: 120 | Fill #0

## 2019-07-09 MED FILL — LOSARTAN POTASSIUM 50 MG TA: 50 | 90 days supply | Qty: 90 | Fill #0

## 2019-07-09 NOTE — Progress Notes (Signed)
Virtual Visit via Telephone Note Due to national recommendations of social distancing due to [place] 19, telehealth visit is felt to be most appropriate for this patient at this time.  I discussed the limitations, risks, security and privacy concerns of performing an evaluation and management service by telephone and the availability of in person appointments. I also discussed with the patient that there may be a patient responsible charge related to this service. The patient expressed understanding and agreed to proceed.    I connected with CASHIS RILL on 07/09/19  at   9:30 AM EST  EDT by telephone and verified that I am speaking with the correct person using two identifiers.   Consent I discussed the limitations, risks, security and privacy concerns of performing an evaluation and management service by telephone and the availability of in person appointments. I also discussed with the patient that there may be a patient responsible charge related to this service. The patient expressed understanding and agreed to proceed.   Location of Patient: Private Residence   Location of Provider: [place] and [employer] Office    Persons participating in Telemedicine visit: Geryl Rankins FNP-BC [place]    History of Present Illness: Telemedicine visit for: COPD  has a past medical history of CHF (congestive heart failure) ([place]), Chronic kidney disease, COPD (chronic obstructive pulmonary disease) ([place]), Depression, Gout, Hypertension, Influenza A with respiratory manifestations, and Mental disorder.   COPD: Patient complains of cough, wheezing, increased sputum and worsening shortness of breath. Symptoms began a few months ago. Symptoms cough productive of white and tenacious sputum in moderate amounts which worsens with exertion.  Fever has been absent.  Patient currently is not on home oxygen therapy.[name] Respiratory history: chronic bronchitis and COPD.  He is  still smoking up to 1 pack/day of cigarettes.  Using his albuterol inhaler multiple times per day.  He has not had an Advair inhaler and several months.  States it was not working for him however it appears he was using it incorrectly at 2 puffs in the a.m. only.  We will start him on maintenance inhaler today and we discussed the appropriate use of SABAs and nebulizers as a backup only.      Past Medical History:  Diagnosis Date  . CHF (congestive heart failure) ([place])   . Chronic kidney disease   . COPD (chronic obstructive pulmonary disease) ([place])   . Depression   . Gout   . Hypertension   . Influenza A with respiratory manifestations   . Mental disorder     Past Surgical History:  Procedure Laterality Date  . ANKLE SURGERY    . CARDIAC CATHETERIZATION    . SHOULDER SURGERY      Family History  Problem Relation Age of Onset  . Heart disease Father     Social History   Socioeconomic History  . Marital status: Single    Spouse name: Not on file  . Number of children: Not on file  . Years of education: Not on file  . Highest education level: Not on file  Occupational History  . Not on file  Tobacco Use  . Smoking status: Current Every Day Smoker    Packs/day: 1.00    Years: 20.00    Pack years: 20.00    Types: Cigarettes  . Smokeless tobacco: Never Used  . Tobacco comment: 3/4 of a pack  Substance and Sexual Activity  . Alcohol use: No  . Drug use:  Yes    Frequency: 21.0 times per week    Types: Marijuana    Comment: no longer- Cocaine  . Sexual activity: Not on file  Other Topics Concern  . Not on file  Social History Narrative   ** Merged History Encounter **       Social Determinants of Health   Financial Resource Strain:   . Difficulty of Paying Living Expenses: Not on file  Food Insecurity:   . Worried About Charity fundraiser in the Last Year: Not on file  . Ran Out of Food in the Last Year: Not on file  Transportation Needs:   . Lack of  Transportation (Medical): Not on file  . Lack of Transportation (Non-Medical): Not on file  Physical Activity:   . Days of Exercise per Week: Not on file  . Minutes of Exercise per Session: Not on file  Stress:   . Feeling of Stress : Not on file  Social Connections:   . Frequency of Communication with Friends and Family: Not on file  . Frequency of Social Gatherings with Friends and Family: Not on file  . Attends Religious Services: Not on file  . Active Member of Clubs or Organizations: Not on file  . Attends Archivist Meetings: Not on file  . Marital Status: Not on file     Observations/Objective: Awake, alert and oriented x 3   ROS  Assessment and Plan: Yuya was seen today for breathing problem.  Diagnoses and all orders for this visit:  COPD (chronic obstructive pulmonary disease) with acute bronchitis (HCC) -     albuterol (VENTOLIN HFA) 108 (90 Base) MCG/ACT inhaler; Inhale 2 puffs into the lungs every 6 (six) hours as needed for wheezing or shortness of breath. -     montelukast (SINGULAIR) 10 MG tablet; Take 1 tablet (10 mg total) by mouth at bedtime. -     albuterol (PROVENTIL) (2.5 MG/3ML) 0.083% nebulizer solution; Take 3 mLs (2.5 mg total) by nebulization every 6 (six) hours as needed for wheezing or shortness of breath (or cough). -     budesonide-formoterol (SYMBICORT) 160-4.5 MCG/ACT inhaler; Inhale 2 puffs into the lungs 2 (two) times daily.  History of gout -     predniSONE (DELTASONE) 20 MG tablet; Take 1 tablet (20 mg total) by mouth daily with breakfast for 5 days. -     allopurinol (ZYLOPRIM) 100 MG tablet; Take 1 tablet (100 mg total) by mouth daily.  Essential hypertension -     losartan (COZAAR) 50 MG tablet; Take 1 tablet (50 mg total) by mouth daily. -     amLODipine (NORVASC) 10 MG tablet; Take 1 tablet (10 mg total) by mouth daily. -     carvedilol (COREG) 25 MG tablet; Take 1 tablet (25 mg total) by mouth 2 (two) times daily with a  meal. -     Blood Pressure Monitor DEVI; Please provide patient with insurance approved blood pressure monitor -     CMP14+EGFR; Future Continue all antihypertensives as prescribed.  Remember to bring in your blood pressure log with you for your follow up appointment.  DASH/Mediterranean Diets are healthier choices for HTN.    Hyperlipidemia, unspecified hyperlipidemia type -     atorvastatin (LIPITOR) 40 MG tablet; Take 1 tablet (40 mg total) by mouth daily at 6 PM. -     Lipid panel; Future INSTRUCTIONS: Work on a low fat, heart healthy diet and participate in regular aerobic exercise program by  working out at least 150 minutes per week; 5 days a week-30 minutes per day. Avoid red meat/beef/steak,  fried foods. junk foods, sodas, sugary drinks, unhealthy snacking, alcohol and smoking.  Drink at least 80 oz of water per day and monitor your carbohydrate intake daily.   Chronic systolic heart failure ([place]) -     Ambulatory referral to Cardiology  Seasonal allergic rhinitis due to pollen -     loratadine (CLARITIN) 10 MG tablet; Take 1 tablet (10 mg total) by mouth daily.  Prostate cancer screening -     PSA; Future  Encounter for screening for diabetes mellitus -     Hemoglobin A1c; Future  Thrombocytopenia (HCC) -     CBC; Future     Follow Up Instructions Return in about 2 months (around 09/06/2019) for F/U with PCP for shortness of breath.     I discussed the assessment and treatment plan with the patient. The patient was provided an opportunity to ask questions and all were answered. The patient agreed with the plan and demonstrated an understanding of the instructions.   The patient was advised to call back or seek an in-person evaluation if the symptoms worsen or if the condition fails to improve as anticipated.  I provided 19 minutes of non-face-to-face time during this encounter including median intraservice time, reviewing previous notes, labs, imaging, medications and  explaining diagnosis and management.  [name], FNP-BC

## 2019-07-16 ENCOUNTER — Telehealth: Payer: Self-pay | Admitting: *Deleted

## 2019-07-16 NOTE — Telephone Encounter (Signed)
Dr Rush Landmark,  Pt scheduled OV with you 08-14-2019 at 950 am I have sent his echo and cath report to [facility] to have scanned in his chart for you

## 2019-07-16 NOTE — Telephone Encounter (Signed)
Please schedule in clinic and then we will plan procedures thereafter.  Let us not plan direct to procedure. Let me know what date he chooses for clinic. Thank you. GM

## 2019-07-16 NOTE — Telephone Encounter (Signed)
I did receive the Echo and Cardiac CAth from [facility]- 10-21-2013 Echo EF 20-25% Cardaic CAth 10-22-2013 EF 25% There are NO further tests in [place] Every where-  Called pt- he will have to cancel his 2-2 PV and his 2-16 Colon  he sees Cardio 07-30-2019- pt will need an OV in the [place] with [place] prior to any procedure- Lm to [facility] to schedule OV-

## 2019-07-16 NOTE — Telephone Encounter (Signed)
I did receive the

## 2019-07-16 NOTE — Telephone Encounter (Signed)
Thanks for the update

## 2019-07-16 NOTE — Telephone Encounter (Signed)
LM again for Pt to cal and sch OV per Dr Rush Landmark

## 2019-07-16 NOTE — Telephone Encounter (Signed)
Dr Rush Landmark and Jenny Reichmann,  This is a new pt to GI with no GI hx. Reviewing his chart for [facility] tomorrow 07-17-2019, it was noted in 2015 he had an ECHO  10-21-2013 with an EF of 20-25%- with further review in a 12-26-2013 Cardiology note it states EF is 40 % - he has pulmonary hypertension, he has a hx of cocaine and cannabis use-  CKD 3, COPD-   I am having [facility] fax the last echo and cardiac cath in 2015- but he has not had any follow up cardiac testing and I wanted to make sure he was OK for [place] or If he needs an OV prior to a colon.  He has also been in [place] and got lost in follow up I believe .  He has a referral for Cardiology in [facility] 07-30-2019 and Colon 07-31-2019  Please advise,   Marijean Niemann

## 2019-07-16 NOTE — Telephone Encounter (Signed)
Lelan Pons,  Thanks for the heads up.  Osvaldo Angst

## 2019-07-25 NOTE — Progress Notes (Deleted)
Office Visit Note  Patient: Chris [name]             Date of Birth: 1961/06/09           MRN: [id number]             PCP: [name], MD Referring: [name], MD Visit Date: 07/30/2019 Occupation: @GUAROCC @  Subjective:  No chief complaint on file.   History of Present Illness: Chris [name] is a 59 y.o. male ***   Activities of Daily Living:  Patient reports morning stiffness for *** {minute/hour:19697}.   Patient {ACTIONS;DENIES/REPORTS:21021675::"Denies"} nocturnal pain.  Difficulty dressing/grooming: {ACTIONS;DENIES/REPORTS:21021675::"Denies"} Difficulty climbing stairs: {ACTIONS;DENIES/REPORTS:21021675::"Denies"} Difficulty getting out of chair: {ACTIONS;DENIES/REPORTS:21021675::"Denies"} Difficulty using hands for taps, buttons, cutlery, and/or writing: {ACTIONS;DENIES/REPORTS:21021675::"Denies"}  No Rheumatology ROS completed.   PMFS History:  Patient Active Problem List   Diagnosis Date Noted  . History of gout 02/01/2019  . Seasonal allergic rhinitis due to pollen 02/01/2019  . Tobacco dependence 11/30/2018  . Microscopic hematuria 11/30/2018  . Depression 11/30/2018  . Difficulty controlling anger 11/30/2018  . COPD (chronic obstructive pulmonary disease) ([place])   . CKD (chronic kidney disease) stage 3, GFR 30-59 ml/min 08/10/2018  . Recurrent epistaxis 04/21/2018  . Mixed hyperlipidemia 07/28/2017  . Essential hypertension 07/28/2017  . Chronic systolic heart failure ([place]) 10/25/2014  . Cocaine abuse ([place]) 02/20/2013  . Cannabis abuse 02/20/2013  . Back pain, chronic 02/20/2013    Past Medical History:  Diagnosis Date  . CHF (congestive heart failure) ([place])   . Chronic kidney disease   . COPD (chronic obstructive pulmonary disease) ([place])   . Depression   . Gout   . Hypertension   . Influenza A with respiratory manifestations   . Mental disorder     Family History  Problem Relation Age of Onset  . Heart disease Father     Past Surgical History:  Procedure Laterality Date  . ANKLE SURGERY    . CARDIAC CATHETERIZATION    . SHOULDER SURGERY     Social History   Social History Narrative   ** Merged History Encounter **       Immunization History  Administered Date(s) Administered  . Tdap 11/30/2018     Objective: Vital Signs: There were no vitals taken for this visit.   Physical Exam   Musculoskeletal Exam: ***  CDAI Exam: CDAI Score: -- Patient Global: --; Provider Global: -- Swollen: --; Tender: -- Joint Exam 07/30/2019   No joint exam has been documented for this visit   There is currently no information documented on the homunculus. Go to the Rheumatology activity and complete the homunculus joint exam.  Investigation: No additional findings.  Imaging: No results found.  Recent Labs: Lab Results  Component Value Date   WBC 9.5 11/30/2018   HGB 14.7 11/30/2018   PLT 185 11/30/2018   NA 138 11/30/2018   K 5.3 (H) 11/30/2018   CL 103 11/30/2018   CO2 24 11/30/2018   GLUCOSE 82 11/30/2018   BUN 29 (H) 11/30/2018   CREATININE 1.75 (H) 11/30/2018   BILITOT 0.2 11/30/2018   ALKPHOS 69 11/30/2018   AST 18 11/30/2018   ALT 18 11/30/2018   PROT 6.5 11/30/2018   ALBUMIN 3.7 (L) 11/30/2018   CALCIUM 10.0 11/30/2018   GFRAA 49 (L) 11/30/2018    Speciality Comments: No specialty comments available.  Procedures:  No procedures performed Allergies: Patient has no known allergies.   Assessment / Plan:  Visit Diagnoses: No diagnosis found.  Orders: No orders of the defined types were placed in this encounter.  No orders of the defined types were placed in this encounter.   Face-to-face time spent with patient was *** minutes. Greater than 50% of time was spent in counseling and coordination of care.  Follow-Up Instructions: No follow-ups on file.   Ofilia Neas, PA-C  Note - This record has been created using Dragon software.  Chart creation errors have been  sought, but may not always  have been located. Such creation errors do not reflect on  the standard of medical care.

## 2019-07-30 ENCOUNTER — Ambulatory Visit: Payer: Medicaid Other | Admitting: Rheumatology

## 2019-07-31 ENCOUNTER — Encounter: Payer: Medicaid Other | Admitting: Gastroenterology

## 2019-08-13 ENCOUNTER — Ambulatory Visit: Payer: Medicaid Other | Admitting: Internal Medicine

## 2019-08-14 ENCOUNTER — Ambulatory Visit: Payer: Medicaid Other | Admitting: Gastroenterology

## 2019-09-05 DIAGNOSIS — I10 Essential (primary) hypertension: Secondary | ICD-10-CM | POA: Diagnosis not present

## 2019-09-07 NOTE — Progress Notes (Deleted)
Office Visit Note  Patient: Chris [name]             Date of Birth: Aug 25, 1960           MRN: [id number]             PCP: [name], MD Referring: [name], MD Visit Date: 09/12/2019 Occupation: @GUAROCC @  Subjective:  No chief complaint on file.   History of Present Illness: Chris [name] is a 59 y.o. male ***   Activities of Daily Living:  Patient reports morning stiffness for *** {minute/hour:19697}.   Patient {ACTIONS;DENIES/REPORTS:21021675::"Denies"} nocturnal pain.  Difficulty dressing/grooming: {ACTIONS;DENIES/REPORTS:21021675::"Denies"} Difficulty climbing stairs: {ACTIONS;DENIES/REPORTS:21021675::"Denies"} Difficulty getting out of chair: {ACTIONS;DENIES/REPORTS:21021675::"Denies"} Difficulty using hands for taps, buttons, cutlery, and/or writing: {ACTIONS;DENIES/REPORTS:21021675::"Denies"}  No Rheumatology ROS completed.   PMFS History:  Patient Active Problem List   Diagnosis Date Noted  . History of gout 02/01/2019  . Seasonal allergic rhinitis due to pollen 02/01/2019  . Tobacco dependence 11/30/2018  . Microscopic hematuria 11/30/2018  . Depression 11/30/2018  . Difficulty controlling anger 11/30/2018  . COPD (chronic obstructive pulmonary disease) ([place])   . CKD (chronic kidney disease) stage 3, GFR 30-59 ml/min 08/10/2018  . Recurrent epistaxis 04/21/2018  . Mixed hyperlipidemia 07/28/2017  . Essential hypertension 07/28/2017  . Chronic systolic heart failure ([place]) 10/25/2014  . Cocaine abuse ([place]) 02/20/2013  . Cannabis abuse 02/20/2013  . Back pain, chronic 02/20/2013    Past Medical History:  Diagnosis Date  . CHF (congestive heart failure) ([place])   . Chronic kidney disease   . COPD (chronic obstructive pulmonary disease) ([place])   . Depression   . Gout   . Hypertension   . Influenza A with respiratory manifestations   . Mental disorder     Family History  Problem Relation Age of Onset  . Heart disease Father     Past Surgical History:  Procedure Laterality Date  . ANKLE SURGERY    . CARDIAC CATHETERIZATION    . SHOULDER SURGERY     Social History   Social History Narrative   ** Merged History Encounter **       Immunization History  Administered Date(s) Administered  . Tdap 11/30/2018     Objective: Vital Signs: There were no vitals taken for this visit.   Physical Exam   Musculoskeletal Exam: ***  CDAI Exam: CDAI Score: -- Patient Global: --; Provider Global: -- Swollen: --; Tender: -- Joint Exam 09/12/2019   No joint exam has been documented for this visit   There is currently no information documented on the homunculus. Go to the Rheumatology activity and complete the homunculus joint exam.  Investigation: No additional findings.  Imaging: No results found.  Recent Labs: Lab Results  Component Value Date   WBC 9.5 11/30/2018   HGB 14.7 11/30/2018   PLT 185 11/30/2018   NA 138 11/30/2018   K 5.3 (H) 11/30/2018   CL 103 11/30/2018   CO2 24 11/30/2018   GLUCOSE 82 11/30/2018   BUN 29 (H) 11/30/2018   CREATININE 1.75 (H) 11/30/2018   BILITOT 0.2 11/30/2018   ALKPHOS 69 11/30/2018   AST 18 11/30/2018   ALT 18 11/30/2018   PROT 6.5 11/30/2018   ALBUMIN 3.7 (L) 11/30/2018   CALCIUM 10.0 11/30/2018   GFRAA 49 (L) 11/30/2018    Speciality Comments: No specialty comments available.  Procedures:  No procedures performed Allergies: Patient has no known allergies.   Assessment / Plan:  Visit Diagnoses: No diagnosis found.  Orders: No orders of the defined types were placed in this encounter.  No orders of the defined types were placed in this encounter.   Face-to-face time spent with patient was *** minutes. Greater than 50% of time was spent in counseling and coordination of care.  Follow-Up Instructions: No follow-ups on file.   Ofilia Neas, PA-C  Note - This record has been created using Dragon software.  Chart creation errors have been  sought, but may not always  have been located. Such creation errors do not reflect on  the standard of medical care.

## 2019-09-11 ENCOUNTER — Ambulatory Visit: Payer: Medicaid Other | Attending: Internal Medicine | Admitting: Physician Assistant

## 2019-09-11 ENCOUNTER — Other Ambulatory Visit: Payer: Self-pay

## 2019-09-11 DIAGNOSIS — E785 Hyperlipidemia, unspecified: Secondary | ICD-10-CM

## 2019-09-11 DIAGNOSIS — F1410 Cocaine abuse, uncomplicated: Secondary | ICD-10-CM

## 2019-09-11 DIAGNOSIS — J209 Acute bronchitis, unspecified: Secondary | ICD-10-CM

## 2019-09-11 DIAGNOSIS — F17200 Nicotine dependence, unspecified, uncomplicated: Secondary | ICD-10-CM

## 2019-09-11 DIAGNOSIS — I10 Essential (primary) hypertension: Secondary | ICD-10-CM

## 2019-09-11 DIAGNOSIS — J301 Allergic rhinitis due to pollen: Secondary | ICD-10-CM

## 2019-09-11 MED ORDER — FLUTICASONE PROPIONATE 50 MCG/ACT NA SUSP: 1.0000 | Freq: Every day | NASAL | 1 refills | Status: DC | PRN

## 2019-09-11 MED ORDER — ALBUTEROL SULFATE HFA 108 (90 BASE) MCG/ACT IN AERS: 2.0000 | INHALATION_SPRAY | Freq: Four times a day (QID) | RESPIRATORY_TRACT | 1 refills | Status: DC | PRN

## 2019-09-11 MED ORDER — LORATADINE 10 MG PO TABS: 10.0000 mg | ORAL_TABLET | Freq: Every day | ORAL | 2 refills | Status: DC

## 2019-09-11 MED ORDER — NICOTINE 21 MG/24HR TD PT24: 21.0000 mg | MEDICATED_PATCH | Freq: Every day | TRANSDERMAL | 0 refills | Status: DC

## 2019-09-11 MED ORDER — PREDNISONE 10 MG PO TABS: ORAL_TABLET | ORAL | 0 refills | Status: DC

## 2019-09-11 MED ORDER — MONTELUKAST SODIUM 10 MG PO TABS: 10.0000 mg | ORAL_TABLET | Freq: Every day | ORAL | 1 refills | Status: DC

## 2019-09-11 MED ORDER — ALBUTEROL SULFATE (2.5 MG/3ML) 0.083% IN NEBU: 2.5000 mg | INHALATION_SOLUTION | Freq: Four times a day (QID) | RESPIRATORY_TRACT | 1 refills | Status: DC | PRN

## 2019-09-11 MED ORDER — BUDESONIDE-FORMOTEROL FUMARATE 160-4.5 MCG/ACT IN AERO: 2.0000 | INHALATION_SPRAY | Freq: Two times a day (BID) | RESPIRATORY_TRACT | 3 refills | Status: DC

## 2019-09-11 MED FILL — ALBUTEROL SULFATE HFA 108 (: 108 (90 BAS | 25 days supply | Qty: 18 | Fill #0

## 2019-09-11 MED FILL — SYMBICORT 160-4.5 MCG INH: 160-4.5 | 30 days supply | Qty: 10 | Fill #0

## 2019-09-11 MED FILL — LORATADINE 10 MG TABLET: 10 | 30 days supply | Qty: 30 | Fill #0

## 2019-09-11 MED FILL — predniSONE 10 MG TABS: 10 | 15 days supply | Qty: 30 | Fill #0

## 2019-09-11 MED FILL — ALBUTEROL 0.083% INHAL SOLN: (2.5 MG/3ML | 25 days supply | Qty: 150 | Fill #0

## 2019-09-11 MED FILL — FLUTICASONE PROP 50 MCG SPR: 50 | 30 days supply | Qty: 16 | Fill #0

## 2019-09-11 MED FILL — NICOTINE 21 MG/24HR PATCH: 21 | 28 days supply | Qty: 28 | Fill #0

## 2019-09-11 NOTE — Progress Notes (Signed)
DOB and name verified  C /o chest congestion, semi productive cough X 1 wk. Unable to fully expectorate  Tried OTC meds little relief Denies any headache,or fever  Pt would like to try med or patches to quit smoking

## 2019-09-11 NOTE — Progress Notes (Signed)
Established Patient Office Visit  Subjective:  Patient ID: Chris [name], male    DOB: [birth year]  Age: 59 y.o. MRN: [id number]  CC:  Chief Complaint  Patient presents with  . Cough    HPI   Virtual Visit via Telephone Note  I connected with Chris [name] on 09/11/19 at  4:00 PM EDT by telephone and verified that I am speaking with the correct person using two identifiers.  Location: Patient: Home [facility]   I discussed the limitations, risks, security and privacy concerns of performing an evaluation and management service by telephone and the availability of in person appointments. I also discussed with the patient that there may be a patient responsible charge related to this service. The patient expressed understanding and agreed to proceed.   History of Present Illness: Patient reports that he has been having a productive cough for the last week, mucus yellowish-white, has been using Symbicort 2 puffs twice daily, nebulizer breathing treatment 2-3 times a day, albuterol rescue inhaler approximately twice a day.  Reports that he has been taking Mucinex over-the-counter.  Feels that when he uses the Symbicort, it makes his cough worse. Cough is keeping him up at night.  Reports he has not been using the Flonase for Claritin.  Reports that he has not been taking his HTN or cholesterol meds, states that he has been using crack cocaine, but stopped one week ago and wants to maintain his sobriety.  States that he also wants to work on stopping smoking. Has been smoking for 40 years, recently cut back to half pack daily  Denies any withdrawal sxs, states that he is not in substance abuse counseling, quit on his own.  Does not check BP at home, denies any hypertensive sxs.  Did not return after last office visit for scheduled labs, did not follow up with cardiology.  Reports that he is scheduled to get his first Covid-19 vax tomorrow  morning   Observations/Objective: Patient chart was reviewed, no physical exam was completed.   Past Medical History:  Diagnosis Date  . CHF (congestive heart failure) ([facility])   . Chronic kidney disease   . COPD (chronic obstructive pulmonary disease) ([facility])   . Depression   . Gout   . Hypertension   . Influenza A with respiratory manifestations   . Mental disorder     Past Surgical History:  Procedure Laterality Date  . ANKLE SURGERY    . CARDIAC CATHETERIZATION    . SHOULDER SURGERY      Family History  Problem Relation Age of Onset  . Heart disease Father     Social History   Socioeconomic History  . Marital status: Single    Spouse name: Not on file  . Number of children: Not on file  . Years of education: Not on file  . Highest education level: Not on file  Occupational History  . Not on file  Tobacco Use  . Smoking status: Current Every Day Smoker    Packs/day: 1.00    Years: 20.00    Pack years: 20.00    Types: Cigarettes  . Smokeless tobacco: Never Used  . Tobacco comment: 3/4 of a pack  Substance and Sexual Activity  . Alcohol use: No  . Drug use: Yes    Frequency: 21.0 times per week    Types: Marijuana    Comment: no longer- Cocaine  . Sexual activity: Not on file  Other Topics Concern  .  Not on file  Social History Narrative   ** Merged History Encounter **       Social Determinants of Health   Financial Resource Strain:   . Difficulty of Paying Living Expenses:   Food Insecurity:   . Worried About Programme researcher, broadcasting/film/video in the Last Year:   . Barista in the Last Year:   Transportation Needs:   . Freight forwarder (Medical):   [name] Lack of Transportation (Non-Medical):   Physical Activity:   . Days of Exercise per Week:   . Minutes of Exercise per Session:   Stress:   . Feeling of Stress :   Social Connections:   . Frequency of Communication with Friends and Family:   . Frequency of Social Gatherings with Friends and Family:    . Attends Religious Services:   . Active Member of Clubs or Organizations:   . Attends Banker Meetings:   [name] Marital Status:   Intimate Partner Violence:   . Fear of Current or Ex-Partner:   . Emotionally Abused:   [name] Physically Abused:   . Sexually Abused:     Outpatient Medications Prior to Visit  Medication Sig Dispense Refill  . allopurinol (ZYLOPRIM) 100 MG tablet Take 1 tablet (100 mg total) by mouth daily. 30 tablet 6  . amLODipine (NORVASC) 10 MG tablet Take 1 tablet (10 mg total) by mouth daily. 90 tablet 0  . atorvastatin (LIPITOR) 40 MG tablet Take 1 tablet (40 mg total) by mouth daily at 6 PM. 30 tablet 2  . Blood Pressure Monitor DEVI Please provide patient with insurance approved blood pressure monitor 1 each 0  . albuterol (PROVENTIL) (2.5 MG/3ML) 0.083% nebulizer solution Take 3 mLs (2.5 mg total) by nebulization every 6 (six) hours as needed for wheezing or shortness of breath (or cough). 150 mL 1  . albuterol (VENTOLIN HFA) 108 (90 Base) MCG/ACT inhaler Inhale 2 puffs into the lungs every 6 (six) hours as needed for wheezing or shortness of breath. 18 g 1  . budesonide-formoterol (SYMBICORT) 160-4.5 MCG/ACT inhaler Inhale 2 puffs into the lungs 2 (two) times daily. 1 Inhaler 3  . fluticasone (FLONASE) 50 MCG/ACT nasal spray Place 1 spray into both nostrils daily as needed for allergies or rhinitis. 16 g 1  . carvedilol (COREG) 25 MG tablet Take 1 tablet (25 mg total) by mouth 2 (two) times daily with a meal. 120 tablet 0  . losartan (COZAAR) 50 MG tablet Take 1 tablet (50 mg total) by mouth daily. 90 tablet 1  . loratadine (CLARITIN) 10 MG tablet Take 1 tablet (10 mg total) by mouth daily. 30 tablet 2  . montelukast (SINGULAIR) 10 MG tablet Take 1 tablet (10 mg total) by mouth at bedtime. 90 tablet 1   No facility-administered medications prior to visit.    No Known Allergies  ROS Review of Systems  Constitutional: Negative for chills, fatigue and  fever.  HENT: Positive for congestion. Negative for ear pain, postnasal drip, rhinorrhea, sinus pressure, sinus pain, sneezing and sore throat.   Eyes: Negative for itching.  Respiratory: Positive for cough and wheezing. Negative for shortness of breath.   Cardiovascular: Negative for chest pain.  Gastrointestinal: Negative.   Endocrine: Negative.   Genitourinary: Negative.   Musculoskeletal: Negative.   Skin: Negative.   Allergic/Immunologic: Negative.   Neurological: Negative for dizziness, syncope and light-headedness.  Hematological: Negative.   Psychiatric/Behavioral: Negative.       Objective:  There were no vitals taken for this visit. Wt Readings from Last 3 Encounters:  11/30/18 250 lb 9.6 oz (113.7 kg)  08/10/18 162 lb (73.5 kg)  07/28/17 271 lb 9.6 oz (123.2 kg)     Health Maintenance Due  Topic Date Due  . COLONOSCOPY  Never done  . INFLUENZA VACCINE  Never done    There are no preventive care reminders to display for this patient.  Lab Results  Component Value Date   TSH 0.842 11/30/2018   Lab Results  Component Value Date   WBC 9.5 11/30/2018   HGB 14.7 11/30/2018   HCT 44.0 11/30/2018   MCV 91 11/30/2018   PLT 185 11/30/2018   Lab Results  Component Value Date   NA 138 11/30/2018   K 5.3 (H) 11/30/2018   CO2 24 11/30/2018   GLUCOSE 82 11/30/2018   BUN 29 (H) 11/30/2018   CREATININE 1.75 (H) 11/30/2018   BILITOT 0.2 11/30/2018   ALKPHOS 69 11/30/2018   AST 18 11/30/2018   ALT 18 11/30/2018   PROT 6.5 11/30/2018   ALBUMIN 3.7 (L) 11/30/2018   CALCIUM 10.0 11/30/2018   ANIONGAP 9 08/11/2018   Lab Results  Component Value Date   CHOL 197 11/30/2018   Lab Results  Component Value Date   HDL 34 (L) 11/30/2018   Lab Results  Component Value Date   LDLCALC 141 (H) 11/30/2018   Lab Results  Component Value Date   TRIG 108 11/30/2018   Lab Results  Component Value Date   CHOLHDL 5.8 (H) 11/30/2018   No results found for:  HGBA1C    Assessment & Plan:   Problem List Items Addressed This Visit      Cardiovascular and Mediastinum   Essential hypertension     Respiratory   Seasonal allergic rhinitis due to pollen   Relevant Medications   fluticasone (FLONASE) 50 MCG/ACT nasal spray   loratadine (CLARITIN) 10 MG tablet     Other   Cocaine abuse ([facility])   Tobacco dependence   Relevant Medications   nicotine (NICODERM CQ - DOSED IN MG/24 HOURS) 21 mg/24hr patch    Other Visit Diagnoses    COPD (chronic obstructive pulmonary disease) with acute bronchitis ([facility])   (Chronic)  -  Primary   Relevant Medications   budesonide-formoterol (SYMBICORT) 160-4.5 MCG/ACT inhaler   fluticasone (FLONASE) 50 MCG/ACT nasal spray   loratadine (CLARITIN) 10 MG tablet   montelukast (SINGULAIR) 10 MG tablet   albuterol (PROVENTIL) (2.5 MG/3ML) 0.083% nebulizer solution   albuterol (VENTOLIN HFA) 108 (90 Base) MCG/ACT inhaler   predniSONE (DELTASONE) 10 MG tablet   nicotine (NICODERM CQ - DOSED IN MG/24 HOURS) 21 mg/24hr patch   Hyperlipidemia, unspecified hyperlipidemia type          Meds ordered this encounter  Medications  . budesonide-formoterol (SYMBICORT) 160-4.5 MCG/ACT inhaler    Sig: Inhale 2 puffs into the lungs 2 (two) times daily.    Dispense:  1 Inhaler    Refill:  3    Order Specific Question:   Supervising Provider    Answer:   Delford Field, PATRICK E [[id number]]  . fluticasone (FLONASE) 50 MCG/ACT nasal spray    Sig: Place 1 spray into both nostrils daily as needed for allergies or rhinitis.    Dispense:  16 g    Refill:  1    Order Specific Question:   Supervising Provider    Answer:   WRIGHT, PATRICK E [[id number]]  . loratadine (CLARITIN)  10 MG tablet    Sig: Take 1 tablet (10 mg total) by mouth daily.    Dispense:  30 tablet    Refill:  2    Order Specific Question:   Supervising Provider    Answer:   Delford Field, PATRICK E [[id number]]  . montelukast (SINGULAIR) 10 MG tablet    Sig: Take 1 tablet (10 mg total) by  mouth at bedtime.    Dispense:  90 tablet    Refill:  1    Order Specific Question:   Supervising Provider    Answer:   Delford Field, PATRICK E [[id number]]  . albuterol (PROVENTIL) (2.5 MG/3ML) 0.083% nebulizer solution    Sig: Take 3 mLs (2.5 mg total) by nebulization every 6 (six) hours as needed for wheezing or shortness of breath (or cough).    Dispense:  150 mL    Refill:  1    Order Specific Question:   Supervising Provider    Answer:   Shan Levans E [[id number]]  . albuterol (VENTOLIN HFA) 108 (90 Base) MCG/ACT inhaler    Sig: Inhale 2 puffs into the lungs every 6 (six) hours as needed for wheezing or shortness of breath.    Dispense:  18 g    Refill:  1    Order Specific Question:   Supervising Provider    Answer:   WRIGHT, PATRICK E [[id number]]  . predniSONE (DELTASONE) 10 MG tablet    Sig: Take 4 tabs PO for 5 days then take 1 tab PO for 10 days    Dispense:  30 tablet    Refill:  0    Order Specific Question:   Supervising Provider    Answer:   Shan Levans E [[id number]]  . nicotine (NICODERM CQ - DOSED IN MG/24 HOURS) 21 mg/24hr patch    Sig: Place 1 patch (21 mg total) onto the skin daily.    Dispense:  28 patch    Refill:  0    Order Specific Question:   Supervising Provider    Answer:   Shan Levans E [[id number]]    Assessment and Plan: 1. COPD (chronic obstructive pulmonary disease) with acute bronchitis ([facility]) Trial prednisone 40 mg once daily for 5 days, then 10 mg once daily for 10 days.  Encouraged patient to continue compliance to inhaler and nebulizer regimen.  - budesonide-formoterol (SYMBICORT) 160-4.5 MCG/ACT inhaler; Inhale 2 puffs into the lungs 2 (two) times daily.  Dispense: 1 Inhaler; Refill: 3 - montelukast (SINGULAIR) 10 MG tablet; Take 1 tablet (10 mg total) by mouth at bedtime.  Dispense: 90 tablet; Refill: 1 - albuterol (PROVENTIL) (2.5 MG/3ML) 0.083% nebulizer solution; Take 3 mLs (2.5 mg total) by nebulization every 6 (six) hours as needed for wheezing or shortness  of breath (or cough).  Dispense: 150 mL; Refill: 1 - albuterol (VENTOLIN HFA) 108 (90 Base) MCG/ACT inhaler; Inhale 2 puffs into the lungs every 6 (six) hours as needed for wheezing or shortness of breath.  Dispense: 18 g; Refill: 1 - predniSONE (DELTASONE) 10 MG tablet; Take 4 tabs PO for 5 days then take 1 tab PO for 10 days  Dispense: 30 tablet; Refill: 0  2. Seasonal allergic rhinitis due to pollen Encouraged patient to resume Flonase and Claritin - fluticasone (FLONASE) 50 MCG/ACT nasal spray; Place 1 spray into both nostrils daily as needed for allergies or rhinitis.  Dispense: 16 g; Refill: 1 - loratadine (CLARITIN) 10 MG tablet; Take 1 tablet (10 mg total) by mouth daily.  Dispense:  30 tablet; Refill: 2  3. Tobacco dependence Trial nicotine patches, gave patient education on smoking cessation  - nicotine (NICODERM CQ - DOSED IN MG/24 HOURS) 21 mg/24hr patch; Place 1 patch (21 mg total) onto the skin daily.  Dispense: 28 patch; Refill: 0  4. Essential hypertension Encouraged patient to resume blood pressure medications, check blood pressure at home, keep daily log and bring to next office visit.  Patient is overdue for fasting labs, encouraged patient to also have blood pressure check while he is in our office.  5. Hyperlipidemia, unspecified hyperlipidemia type Return to office for overdue fasting labs  6. Cocaine abuse [facility]) Patient refuses substance abuse counseling, encouraged patient to find support group.   Follow Up Instructions:    I discussed the assessment and treatment plan with the patient. The patient was provided an opportunity to ask questions and all were answered. The patient agreed with the plan and demonstrated an understanding of the instructions.   The patient was advised to call back or seek an in-person evaluation if the symptoms worsen or if the condition fails to improve as anticipated.  I provided 25 minutes of non-face-to-face time during this  encounter.   Azari Hasler S Mayers, PA-C  Follow-up: Return in about 4 weeks (around 10/09/2019) for Follow up with Dr. Laural Benes.    Kasandra Knudsen Mayers, PA-C

## 2019-09-12 ENCOUNTER — Ambulatory Visit: Payer: Medicaid Other | Admitting: Rheumatology

## 2019-10-17 ENCOUNTER — Ambulatory Visit: Payer: Medicaid Other | Attending: Internal Medicine | Admitting: Physician Assistant

## 2019-10-17 ENCOUNTER — Other Ambulatory Visit: Payer: Self-pay

## 2019-10-17 DIAGNOSIS — J440 Chronic obstructive pulmonary disease with acute lower respiratory infection: Secondary | ICD-10-CM

## 2019-10-17 DIAGNOSIS — G4700 Insomnia, unspecified: Secondary | ICD-10-CM

## 2019-10-17 DIAGNOSIS — J209 Acute bronchitis, unspecified: Secondary | ICD-10-CM

## 2019-10-17 MED ORDER — AZITHROMYCIN 250 MG PO TABS: ORAL_TABLET | ORAL | 0 refills | Status: DC

## 2019-10-17 MED ORDER — TRAZODONE HCL 50 MG PO TABS: 25.0000 mg | ORAL_TABLET | Freq: Every evening | ORAL | 3 refills | Status: DC | PRN

## 2019-10-17 MED ORDER — BENZONATATE 100 MG PO CAPS: 200.0000 mg | ORAL_CAPSULE | Freq: Three times a day (TID) | ORAL | 0 refills | Status: DC | PRN

## 2019-10-17 MED FILL — traZODone HCL 50 MG TABS: 50 | 30 days supply | Qty: 30 | Fill #0

## 2019-10-17 MED FILL — AZITHROMYCIN 250 MG TABLET: 250 | 5 days supply | Qty: 6 | Fill #0

## 2019-10-17 NOTE — Progress Notes (Signed)
Virtual Visit via Telephone Note  I connected with BENNETTE MANIGAULT on 10/17/19 at  1:30 PM EDT by telephone and verified that I am speaking with the correct person using two identifiers.   I discussed the limitations, risks, security and privacy concerns of performing an evaluation and management service by telephone and the availability of in person appointments. I also discussed with the patient that there may be a patient responsible charge related to this service. The patient expressed understanding and agreed to proceed.  PATIENT visit by telephone virtually in the context of Covid-19 pandemic. Patient location:  home My Location:  [place] office Persons on the call:  Me and the patient   History of Present Illness:  Cough and congestion.  He has been coughing up mucus on and off for about 2 months.  Mucus is greenish.  Inhalers help some.  No fever.  Had chest CT and CXR in February.    Also has trouble sleeping.  Hasn't taken anything for this.      Observations/Objective:  NAD.     Assessment and Plan: 1. COPD (chronic obstructive pulmonary disease) with acute bronchitis ([place]) Will cover for atypicals - azithromycin (ZITHROMAX) 250 MG tablet; Take 2 today then 1 daily  Dispense: 6 tablet; Refill: 0 - benzonatate (TESSALON) 100 MG capsule; Take 2 capsules (200 mg total) by mouth 3 (three) times daily as needed.  Dispense: 40 capsule; Refill: 0  2. Insomnia, unspecified type - traZODone (DESYREL) 50 MG tablet; Take 0.5-1 tablets (25-50 mg total) by mouth at bedtime as needed for sleep.  Dispense: 30 tablet; Refill: 3   Follow Up Instructions: See PCP in 2 months   I discussed the assessment and treatment plan with the patient. The patient was provided an opportunity to ask questions and all were answered. The patient agreed with the plan and demonstrated an understanding of the instructions.   The patient was advised to call back or seek an in-person evaluation if the symptoms  worsen or if the condition fails to improve as anticipated.  I provided 11 minutes of non-face-to-face time during this encounter.   Freeman Caldron, PA-C  Patient ID: JARDON HELLWEGE, male   DOB: [birth year], 59 y.o.   MRN: [id number]

## 2019-10-26 ENCOUNTER — Inpatient Hospital Stay (HOSPITAL_COMMUNITY)
Admission: EM | Admit: 2019-10-26 | Discharge: 2019-10-29 | DRG: 291 | Disposition: A | Discharge status: Home / Self Care | Payer: Medicaid Other | Attending: Internal Medicine | Admitting: Internal Medicine

## 2019-10-26 ENCOUNTER — Emergency Department (HOSPITAL_COMMUNITY): Payer: Medicaid Other

## 2019-10-26 ENCOUNTER — Encounter (HOSPITAL_COMMUNITY): Payer: Self-pay | Admitting: *Deleted

## 2019-10-26 ENCOUNTER — Other Ambulatory Visit: Payer: Self-pay

## 2019-10-26 DIAGNOSIS — I160 Hypertensive urgency: Secondary | ICD-10-CM | POA: Diagnosis present

## 2019-10-26 DIAGNOSIS — I248 Other forms of acute ischemic heart disease: Secondary | ICD-10-CM | POA: Diagnosis present

## 2019-10-26 DIAGNOSIS — F17200 Nicotine dependence, unspecified, uncomplicated: Secondary | ICD-10-CM | POA: Diagnosis not present

## 2019-10-26 DIAGNOSIS — I5033 Acute on chronic diastolic (congestive) heart failure: Secondary | ICD-10-CM | POA: Diagnosis not present

## 2019-10-26 DIAGNOSIS — R778 Other specified abnormalities of plasma proteins: Secondary | ICD-10-CM | POA: Diagnosis not present

## 2019-10-26 DIAGNOSIS — M549 Dorsalgia, unspecified: Secondary | ICD-10-CM | POA: Diagnosis present

## 2019-10-26 DIAGNOSIS — R0602 Shortness of breath: Secondary | ICD-10-CM | POA: Diagnosis not present

## 2019-10-26 DIAGNOSIS — N183 Chronic kidney disease, stage 3 unspecified: Secondary | ICD-10-CM | POA: Diagnosis present

## 2019-10-26 DIAGNOSIS — Z7951 Long term (current) use of inhaled steroids: Secondary | ICD-10-CM

## 2019-10-26 DIAGNOSIS — I161 Hypertensive emergency: Secondary | ICD-10-CM

## 2019-10-26 DIAGNOSIS — M109 Gout, unspecified: Secondary | ICD-10-CM | POA: Diagnosis present

## 2019-10-26 DIAGNOSIS — Z20822 Contact with and (suspected) exposure to covid-19: Secondary | ICD-10-CM | POA: Diagnosis present

## 2019-10-26 DIAGNOSIS — E782 Mixed hyperlipidemia: Secondary | ICD-10-CM | POA: Diagnosis present

## 2019-10-26 DIAGNOSIS — G8929 Other chronic pain: Secondary | ICD-10-CM | POA: Diagnosis present

## 2019-10-26 DIAGNOSIS — I110 Hypertensive heart disease with heart failure: Secondary | ICD-10-CM | POA: Diagnosis not present

## 2019-10-26 DIAGNOSIS — N1831 Chronic kidney disease, stage 3a: Secondary | ICD-10-CM | POA: Diagnosis not present

## 2019-10-26 DIAGNOSIS — F1410 Cocaine abuse, uncomplicated: Secondary | ICD-10-CM | POA: Diagnosis present

## 2019-10-26 DIAGNOSIS — Z9119 Patient's noncompliance with other medical treatment and regimen: Secondary | ICD-10-CM

## 2019-10-26 DIAGNOSIS — I428 Other cardiomyopathies: Secondary | ICD-10-CM | POA: Diagnosis present

## 2019-10-26 DIAGNOSIS — I130 Hypertensive heart and chronic kidney disease with heart failure and stage 1 through stage 4 chronic kidney disease, or unspecified chronic kidney disease: Principal | ICD-10-CM | POA: Diagnosis present

## 2019-10-26 DIAGNOSIS — I169 Hypertensive crisis, unspecified: Secondary | ICD-10-CM | POA: Diagnosis not present

## 2019-10-26 DIAGNOSIS — E875 Hyperkalemia: Secondary | ICD-10-CM | POA: Diagnosis present

## 2019-10-26 DIAGNOSIS — I272 Pulmonary hypertension, unspecified: Secondary | ICD-10-CM | POA: Diagnosis present

## 2019-10-26 DIAGNOSIS — F17210 Nicotine dependence, cigarettes, uncomplicated: Secondary | ICD-10-CM | POA: Diagnosis present

## 2019-10-26 DIAGNOSIS — R7989 Other specified abnormal findings of blood chemistry: Secondary | ICD-10-CM | POA: Diagnosis not present

## 2019-10-26 DIAGNOSIS — F1210 Cannabis abuse, uncomplicated: Secondary | ICD-10-CM | POA: Diagnosis present

## 2019-10-26 DIAGNOSIS — I5043 Acute on chronic combined systolic (congestive) and diastolic (congestive) heart failure: Secondary | ICD-10-CM | POA: Diagnosis present

## 2019-10-26 DIAGNOSIS — Z6836 Body mass index (BMI) 36.0-36.9, adult: Secondary | ICD-10-CM

## 2019-10-26 DIAGNOSIS — I10 Essential (primary) hypertension: Secondary | ICD-10-CM | POA: Diagnosis not present

## 2019-10-26 DIAGNOSIS — Z79899 Other long term (current) drug therapy: Secondary | ICD-10-CM

## 2019-10-26 DIAGNOSIS — I5021 Acute systolic (congestive) heart failure: Secondary | ICD-10-CM | POA: Diagnosis not present

## 2019-10-26 DIAGNOSIS — J301 Allergic rhinitis due to pollen: Secondary | ICD-10-CM | POA: Diagnosis present

## 2019-10-26 DIAGNOSIS — I509 Heart failure, unspecified: Secondary | ICD-10-CM | POA: Diagnosis not present

## 2019-10-26 DIAGNOSIS — J449 Chronic obstructive pulmonary disease, unspecified: Secondary | ICD-10-CM | POA: Diagnosis present

## 2019-10-26 DIAGNOSIS — I5030 Unspecified diastolic (congestive) heart failure: Secondary | ICD-10-CM

## 2019-10-26 LAB — BASIC METABOLIC PANEL
Anion gap: 9 (ref 5–15)
BUN: 30 mg/dL — ABNORMAL HIGH (ref 6–20)
CO2: 26 mmol/L (ref 22–32)
Calcium: 8.9 mg/dL (ref 8.9–10.3)
Chloride: 104 mmol/L (ref 98–111)
Creatinine, Ser: 1.92 mg/dL — ABNORMAL HIGH (ref 0.61–1.24)
GFR calc Af Amer: 44 mL/min — ABNORMAL LOW (ref >60)
GFR calc non Af Amer: 38 mL/min — ABNORMAL LOW (ref >60)
Glucose, Bld: 100 mg/dL — ABNORMAL HIGH (ref 70–99)
Potassium: 5.3 mmol/L — ABNORMAL HIGH (ref 3.5–5.1)
Sodium: 139 mmol/L (ref 135–145)

## 2019-10-26 LAB — HEPATIC FUNCTION PANEL
ALT: 56 U/L — ABNORMAL HIGH (ref 0–44)
AST: 41 U/L (ref 15–41)
Albumin: 2.8 g/dL — ABNORMAL LOW (ref 3.5–5.0)
Alkaline Phosphatase: 64 U/L (ref 38–126)
Bilirubin, Direct: 0.3 mg/dL — ABNORMAL HIGH (ref 0.0–0.2)
Indirect Bilirubin: 0.3 mg/dL (ref 0.3–0.9)
Total Bilirubin: 0.6 mg/dL (ref 0.3–1.2)
Total Protein: 5.8 g/dL — ABNORMAL LOW (ref 6.5–8.1)

## 2019-10-26 LAB — CBC
HCT: 47.4 % (ref 39.0–52.0)
HCT: 49.4 % (ref 39.0–52.0)
Hemoglobin: 14.9 g/dL (ref 13.0–17.0)
Hemoglobin: 15.7 g/dL (ref 13.0–17.0)
MCH: 29.7 pg (ref 26.0–34.0)
MCH: 30.3 pg (ref 26.0–34.0)
MCHC: 31.4 g/dL (ref 30.0–36.0)
MCHC: 31.8 g/dL (ref 30.0–36.0)
MCV: 94.4 fL (ref 80.0–100.0)
MCV: 95.4 fL (ref 80.0–100.0)
Platelets: 240 10*3/uL (ref 150–400)
Platelets: 252 10*3/uL (ref 150–400)
RBC: 5.02 MIL/uL (ref 4.22–5.81)
RBC: 5.18 MIL/uL (ref 4.22–5.81)
RDW: 15.3 % (ref 11.5–15.5)
RDW: 15.3 % (ref 11.5–15.5)
WBC: 11.9 10*3/uL — ABNORMAL HIGH (ref 4.0–10.5)
WBC: 11.8 10*3/uL — ABNORMAL HIGH (ref 4.0–10.5)
nRBC: 0 % (ref 0.0–0.2)
nRBC: 0.2 % (ref 0.0–0.2)

## 2019-10-26 LAB — TROPONIN I (HIGH SENSITIVITY)
Troponin I (High Sensitivity): 124 ng/L (ref <18)
Troponin I (High Sensitivity): 133 ng/L (ref <18)

## 2019-10-26 LAB — TSH: TSH: 1.592 u[IU]/mL (ref 0.350–4.500)

## 2019-10-26 LAB — BRAIN NATRIURETIC PEPTIDE: B Natriuretic Peptide: 1823.3 pg/mL — ABNORMAL HIGH (ref 0.0–100.0)

## 2019-10-26 LAB — SARS CORONAVIRUS 2 BY RT PCR (HOSPITAL ORDER, PERFORMED IN ~~LOC~~ HOSPITAL LAB): SARS Coronavirus 2: NEGATIVE

## 2019-10-26 LAB — LIPASE, BLOOD: Lipase: 29 U/L (ref 11–51)

## 2019-10-26 LAB — POC SARS CORONAVIRUS 2 AG -  ED: SARS Coronavirus 2 Ag: NEGATIVE

## 2019-10-26 MED ORDER — FUROSEMIDE 10 MG/ML IJ SOLN
40.0000 mg | Freq: Once | INTRAMUSCULAR | Status: AC
  Administered 2019-10-26: 40 mg via INTRAVENOUS
  Filled 2019-10-26: qty 4

## 2019-10-26 MED ORDER — LOSARTAN POTASSIUM 50 MG PO TABS: 50.0000 mg | ORAL_TABLET | Freq: Every day | ORAL | Status: DC

## 2019-10-26 MED ORDER — TRAZODONE HCL 50 MG PO TABS
25.0000 mg | ORAL_TABLET | Freq: Every evening | ORAL | Status: DC | PRN
  Administered 2019-10-26 – 2019-10-28 (×3): 50 mg via ORAL
  Filled 2019-10-26 (×3): qty 1

## 2019-10-26 MED ORDER — HYDRALAZINE HCL 50 MG PO TABS
100.0000 mg | ORAL_TABLET | Freq: Three times a day (TID) | ORAL | Status: DC
  Administered 2019-10-26 – 2019-10-28 (×3): 100 mg via ORAL
  Filled 2019-10-26 (×3): qty 2

## 2019-10-26 MED ORDER — ACETAMINOPHEN 325 MG PO TABS
650.0000 mg | ORAL_TABLET | ORAL | Status: DC | PRN
  Administered 2019-10-27: 650 mg via ORAL
  Filled 2019-10-26: qty 2

## 2019-10-26 MED ORDER — CARVEDILOL 25 MG PO TABS
25.0000 mg | ORAL_TABLET | Freq: Two times a day (BID) | ORAL | Status: DC
  Administered 2019-10-27 – 2019-10-29 (×5): 25 mg via ORAL
  Filled 2019-10-26 (×5): qty 1

## 2019-10-26 MED ORDER — FUROSEMIDE 10 MG/ML IJ SOLN
40.0000 mg | Freq: Two times a day (BID) | INTRAMUSCULAR | Status: DC
Start: 2019-10-26 — End: 2019-10-26

## 2019-10-26 MED ORDER — NICOTINE 21 MG/24HR TD PT24
21.0000 mg | MEDICATED_PATCH | Freq: Every day | TRANSDERMAL | Status: DC
  Administered 2019-10-27 – 2019-10-29 (×3): 21 mg via TRANSDERMAL
  Filled 2019-10-26 (×3): qty 1

## 2019-10-26 MED ORDER — ALPRAZOLAM 0.25 MG PO TABS
0.2500 mg | ORAL_TABLET | Freq: Two times a day (BID) | ORAL | Status: DC | PRN
  Administered 2019-10-26 – 2019-10-27 (×2): 0.25 mg via ORAL
  Filled 2019-10-26 (×2): qty 1

## 2019-10-26 MED ORDER — FUROSEMIDE 10 MG/ML IJ SOLN
80.0000 mg | Freq: Two times a day (BID) | INTRAMUSCULAR | Status: DC
  Administered 2019-10-27 (×2): 80 mg via INTRAVENOUS
  Filled 2019-10-26 (×2): qty 8

## 2019-10-26 MED ORDER — ALBUTEROL SULFATE HFA 108 (90 BASE) MCG/ACT IN AERS: 2.0000 | INHALATION_SPRAY | Freq: Four times a day (QID) | RESPIRATORY_TRACT | Status: DC | PRN

## 2019-10-26 MED ORDER — NITROGLYCERIN IN D5W 200-5 MCG/ML-% IV SOLN
0.0000 ug/min | INTRAVENOUS | Status: DC
  Administered 2019-10-26: 5 ug/min via INTRAVENOUS
  Filled 2019-10-26: qty 250

## 2019-10-26 MED ORDER — HEPARIN SODIUM (PORCINE) 5000 UNIT/ML IJ SOLN
5000.0000 [IU] | Freq: Three times a day (TID) | INTRAMUSCULAR | Status: DC
  Administered 2019-10-26 – 2019-10-29 (×8): 5000 [IU] via SUBCUTANEOUS
  Filled 2019-10-26 (×9): qty 1

## 2019-10-26 MED ORDER — AMLODIPINE BESYLATE 5 MG PO TABS: 10.0000 mg | ORAL_TABLET | Freq: Every day | ORAL | Status: DC

## 2019-10-26 MED ORDER — ONDANSETRON HCL 4 MG/2ML IJ SOLN: 4.0000 mg | Freq: Four times a day (QID) | INTRAMUSCULAR | Status: DC | PRN

## 2019-10-26 MED ORDER — SODIUM CHLORIDE 0.9% FLUSH
3.0000 mL | Freq: Two times a day (BID) | INTRAVENOUS | Status: DC
  Administered 2019-10-27 – 2019-10-29 (×4): 3 mL via INTRAVENOUS

## 2019-10-26 MED ORDER — BENZONATATE 100 MG PO CAPS
200.0000 mg | ORAL_CAPSULE | Freq: Three times a day (TID) | ORAL | Status: DC | PRN
  Administered 2019-10-26: 200 mg via ORAL
  Filled 2019-10-26: qty 2

## 2019-10-26 MED ORDER — SODIUM CHLORIDE 0.9% FLUSH: 3.0000 mL | INTRAVENOUS | Status: DC | PRN

## 2019-10-26 MED ORDER — FLUTICASONE PROPIONATE 50 MCG/ACT NA SUSP
1.0000 | Freq: Every day | NASAL | Status: DC | PRN
  Filled 2019-10-26: qty 16

## 2019-10-26 MED ORDER — HYDRALAZINE HCL 25 MG PO TABS: 100.0000 mg | ORAL_TABLET | Freq: Three times a day (TID) | ORAL | Status: DC

## 2019-10-26 MED ORDER — MONTELUKAST SODIUM 10 MG PO TABS
10.0000 mg | ORAL_TABLET | Freq: Every day | ORAL | Status: DC
  Administered 2019-10-26 – 2019-10-28 (×3): 10 mg via ORAL
  Filled 2019-10-26 (×4): qty 1

## 2019-10-26 MED ORDER — ASPIRIN EC 81 MG PO TBEC
81.0000 mg | DELAYED_RELEASE_TABLET | Freq: Every day | ORAL | Status: DC
  Administered 2019-10-27 – 2019-10-29 (×3): 81 mg via ORAL
  Filled 2019-10-26 (×3): qty 1

## 2019-10-26 MED ORDER — SODIUM CHLORIDE 0.9 % IV SOLN: 250.0000 mL | INTRAVENOUS | Status: DC | PRN

## 2019-10-26 MED ORDER — SODIUM ZIRCONIUM CYCLOSILICATE 10 G PO PACK
10.0000 g | PACK | Freq: Once | ORAL | Status: AC
  Administered 2019-10-26: 10 g via ORAL
  Filled 2019-10-26: qty 1

## 2019-10-26 MED ORDER — SODIUM CHLORIDE 0.9% FLUSH: 3.0000 mL | Freq: Once | INTRAVENOUS | Status: DC

## 2019-10-26 MED ORDER — PNEUMOCOCCAL VAC POLYVALENT 25 MCG/0.5ML IJ INJ: 0.5000 mL | INJECTION | INTRAMUSCULAR | Status: DC

## 2019-10-26 MED ORDER — ATORVASTATIN CALCIUM 40 MG PO TABS
40.0000 mg | ORAL_TABLET | Freq: Every day | ORAL | Status: DC
  Administered 2019-10-27 – 2019-10-28 (×2): 40 mg via ORAL
  Filled 2019-10-26 (×2): qty 1

## 2019-10-26 MED ORDER — FUROSEMIDE 10 MG/ML IJ SOLN: 100.0000 mg | Freq: Two times a day (BID) | INTRAVENOUS | Status: DC

## 2019-10-26 MED ORDER — MOMETASONE FURO-FORMOTEROL FUM 200-5 MCG/ACT IN AERO
2.0000 | INHALATION_SPRAY | Freq: Two times a day (BID) | RESPIRATORY_TRACT | Status: DC
  Administered 2019-10-27 – 2019-10-29 (×4): 2 via RESPIRATORY_TRACT
  Filled 2019-10-26 (×2): qty 8.8

## 2019-10-26 MED ORDER — ALBUTEROL SULFATE (2.5 MG/3ML) 0.083% IN NEBU
2.5000 mg | INHALATION_SOLUTION | Freq: Four times a day (QID) | RESPIRATORY_TRACT | Status: DC | PRN
  Administered 2019-10-27: 2.5 mg via RESPIRATORY_TRACT
  Filled 2019-10-26: qty 3

## 2019-10-26 NOTE — ED Notes (Signed)
Cardiology at  The bedside

## 2019-10-26 NOTE — ED Notes (Signed)
Report given  To sheri  On 6e

## 2019-10-26 NOTE — Consult Note (Signed)
Cardiology Consultation:  Patient ID: Chris [name] MRN: [id number]; DOB: 1961/04/20  Admit date: 10/26/2019 Date of Consult: 10/26/2019  Primary Care Provider: [name], MD Primary Cardiologist: No primary care provider on file.   Patient Profile:  Chris [name] is a 59 y.o. male with a hx of nonischemic cardiomyopathy (EF was 20-25% but most recently documented at [facility] 40%, normal left heart cath in 2015 [facility]), CKD, cocaine abuse, tobacco abuse, hypertension who is being seen today for the evaluation of elevated BNP/troponin/SOB at the request of Chris Deutscher, MD.  History of Present Illness:  [name] presents with 1 month of progressively worsening shortness of breath and lower extremity edema.  He has a longstanding history of a nonischemic cardiomyopathy and medication noncompliance.  He reports he is to stop taking his medications.  He reports that he just had a lack of desire to carry forward with aggressive medical care.  Apparently this resulted in him using cocaine last night.  He reports that he had intermittent right-sided chest pain.  The pain is reported as constant and sharp.  It is associated with deep breathing.  He does report symptoms orthopnea as well as PND.  He has been completely out of his medications and not taking them.  On arrival to the emergency room blood pressure 164/126, respirations 36, heart rate 101, saturations 93% on room air.  Chest x-ray consistent with pulmonary edema.  Laboratory data with elevated BNP, 1823.  High sensitive troponin mildly elevated 124.  His EKG demonstrates normal sinus rhythm heart rate 95 with LVH and repolarization abnormality.  Serum creatinine 1.92 (baseline 1.7).  White count 11.8, hemoglobin 15.7.  He was started on a nitroglycerin drip with some improvement in symptoms.  He received 40 mg of IV Lasix with 2.2 L urine output.  He does remain grossly volume overloaded and still tachypneic.  He reports infrequent  cocaine use but did use yesterday.  CVD risk factors include uncontrolled hypertension, CKD, tobacco abuse.  Heart Pathway Score:       Past Medical History: Past Medical History:  Diagnosis Date  . CHF (congestive heart failure) ([place])   . Chronic kidney disease   . COPD (chronic obstructive pulmonary disease) ([place])   . Depression   . Gout   . Hypertension   . Influenza A with respiratory manifestations   . Mental disorder     Past Surgical History: Past Surgical History:  Procedure Laterality Date  . ANKLE SURGERY    . CARDIAC CATHETERIZATION    . SHOULDER SURGERY       Home Medications:  Prior to Admission medications   Medication Sig Start Date End Date Taking? Authorizing Provider  albuterol (PROVENTIL) (2.5 MG/3ML) 0.083% nebulizer solution Take 3 mLs (2.5 mg total) by nebulization every 6 (six) hours as needed for wheezing or shortness of breath (or cough). 09/11/19  Yes Mayers, Cari S, PA-C  albuterol (VENTOLIN HFA) 108 (90 Base) MCG/ACT inhaler Inhale 2 puffs into the lungs every 6 (six) hours as needed for wheezing or shortness of breath. 09/11/19  Yes Mayers, Cari S, PA-C  budesonide-formoterol (SYMBICORT) 160-4.5 MCG/ACT inhaler Inhale 2 puffs into the lungs 2 (two) times daily. 09/11/19  Yes Mayers, Cari S, PA-C  allopurinol (ZYLOPRIM) 100 MG tablet Take 1 tablet (100 mg total) by mouth daily. 07/09/19   Chris [name]  amLODipine (NORVASC) 10 MG tablet Take 1 tablet (10 mg total) by mouth daily. 07/09/19   Chris [name]  W, [name]  atorvastatin (LIPITOR) 40 MG tablet Take 1 tablet (40 mg total) by mouth daily at 6 PM. 07/09/19   Chris [name]  benzonatate (TESSALON) 100 MG capsule Take 2 capsules (200 mg total) by mouth 3 (three) times daily as needed. Patient taking differently: Take 200 mg by mouth 3 (three) times daily as needed for cough.  10/17/19   Chris Donovan, PA-C  Blood Pressure Monitor DEVI Please provide patient with insurance approved blood  pressure monitor 07/09/19   Chris [name]  carvedilol (COREG) 25 MG tablet Take 1 tablet (25 mg total) by mouth 2 (two) times daily with a meal. 07/09/19 09/07/19  Chris [name]  fluticasone (FLONASE) 50 MCG/ACT nasal spray Place 1 spray into both nostrils daily as needed for allergies or rhinitis. 09/11/19   Mayers, Cari S, PA-C  loratadine (CLARITIN) 10 MG tablet Take 1 tablet (10 mg total) by mouth daily. 09/11/19   Mayers, Cari S, PA-C  losartan (COZAAR) 50 MG tablet Take 1 tablet (50 mg total) by mouth daily. 07/09/19   Chris [name]  montelukast (SINGULAIR) 10 MG tablet Take 1 tablet (10 mg total) by mouth at bedtime. 09/11/19 03/09/20  Mayers, Cari S, PA-C  nicotine (NICODERM CQ - DOSED IN MG/24 HOURS) 21 mg/24hr patch Place 1 patch (21 mg total) onto the skin daily. 09/11/19   Mayers, Cari S, PA-C  predniSONE (DELTASONE) 10 MG tablet Take 4 tabs PO for 5 days then take 1 tab PO for 10 days Patient taking differently: Take 10 mg by mouth See admin instructions. Take 4 tabs PO for 5 days then take 1 tab PO for 10 days 09/11/19   Mayers, Cari S, PA-C  traZODone (DESYREL) 50 MG tablet Take 0.5-1 tablets (25-50 mg total) by mouth at bedtime as needed for sleep. 10/17/19   Chris Donovan, PA-C    Inpatient Medications: Scheduled Meds: . amLODipine  10 mg Oral Daily  . aspirin EC  81 mg Oral Daily  . [START ON 10/27/2019] atorvastatin  40 mg Oral q1800  . [START ON 10/27/2019] carvedilol  25 mg Oral BID WC  . furosemide  40 mg Intravenous Q12H  . heparin  5,000 Units Subcutaneous Q8H  . losartan  50 mg Oral Daily  . mometasone-formoterol  2 puff Inhalation BID  . montelukast  10 mg Oral QHS  . nicotine  21 mg Transdermal Daily  . sodium chloride flush  3 mL Intravenous Once  . sodium chloride flush  3 mL Intravenous Q12H   Continuous Infusions: . sodium chloride    . nitroGLYCERIN 5 mcg/min (10/26/19 1825)   PRN Meds: sodium chloride, acetaminophen, albuterol, ALPRAZolam,  benzonatate, fluticasone, ondansetron (ZOFRAN) IV, sodium chloride flush, traZODone  Allergies:    No Known Allergies  Social History:   Social History   Socioeconomic History  . Marital status: Single    Spouse name: Not on file  . Number of children: Not on file  . Years of education: Not on file  . Highest education level: Not on file  Occupational History  . Not on file  Tobacco Use  . Smoking status: Current Every Day Smoker    Packs/day: 1.00    Years: 20.00    Pack years: 20.00    Types: Cigarettes  . Smokeless tobacco: Never Used  . Tobacco comment: 3/4 of a pack  Substance and Sexual Activity  . Alcohol use: No  . Drug use: Yes  Frequency: 21.0 times per week    Types: Marijuana, Cocaine    Comment: no longer- Cocaine  . Sexual activity: Not on file  Other Topics Concern  . Not on file  Social History Narrative   ** Merged History Encounter **       Social Determinants of Health   Financial Resource Strain:   . Difficulty of Paying Living Expenses:   Food Insecurity:   . Worried About Charity fundraiser in the Last Year:   . Arboriculturist in the Last Year:   Transportation Needs:   . Film/video editor (Medical):   [name] Lack of Transportation (Non-Medical):   Physical Activity:   . Days of Exercise per Week:   . Minutes of Exercise per Session:   Stress:   . Feeling of Stress :   Social Connections:   . Frequency of Communication with Friends and Family:   . Frequency of Social Gatherings with Friends and Family:   . Attends Religious Services:   . Active Member of Clubs or Organizations:   . Attends Archivist Meetings:   [name] Marital Status:   Intimate Partner Violence:   . Fear of Current or Ex-Partner:   . Emotionally Abused:   [name] Physically Abused:   . Sexually Abused:      Family History:    Family History  Problem Relation Age of Onset  . Heart disease Father      ROS:  All other ROS reviewed and negative. Pertinent  positives noted in the HPI.     Physical Exam/Data:   Vitals:   10/26/19 1545 10/26/19 1600 10/26/19 1615 10/26/19 1730  BP: (!) 164/126 (!) 167/126 (!) 152/127 (!) 158/123  Pulse: 91 80 99 81  Resp: (!) 27 (!) 28 (!) 41 (!) 35  Temp:      TempSrc:      SpO2: 94% 98% 93% 97%  Weight:      Height:         Intake/Output Summary (Last 24 hours) at 10/26/2019 1905 Last data filed at 10/26/2019 1852 Gross per 24 hour  Intake --  Output 2250 ml  Net -2250 ml    Last 3 Weights 10/26/2019 11/30/2018 08/10/2018  Weight (lbs) 270 lb 250 lb 9.6 oz 162 lb  Weight (kg) 122.471 kg 113.671 kg 73.483 kg  Some encounter information is confidential and restricted. Go to Review Flowsheets activity to see all data.    Body mass index is 36.62 kg/m.  General: Tachypnea noted Head: Atraumatic, normal size  Eyes: PEERLA, EOMI  Neck: Supple, JVD around 15 cm of water Endocrine: No thryomegaly Cardiac: Normal S1, S2; RRR; no murmurs, rubs, or gallops Lungs: Rales bilaterally Abd: Soft, nontender, no hepatomegaly  Ext: 2+ pitting edema up to knees Musculoskeletal: No deformities, BUE and BLE strength normal and equal Skin: Warm and dry, no rashes   Neuro: Alert and oriented to person, place, time, and situation, CNII-XII grossly intact, no focal deficits  Psych: Normal mood and affect   EKG:  The EKG was personally reviewed and demonstrates: Sinus rhythm with heart rate 95, LVH with repolarization abnormality Telemetry:  Telemetry was personally reviewed and demonstrates: Normal sinus rhythm with sinus tachycardia  Relevant CV Studies: Echocardiogram reports mentioned in [facility] notes report an ejection fraction up to 40%.  Reported left heart catheterization at [facility] in 2015 per the [facility] records shows normal left heart catheterization.  Laboratory Data: High Sensitivity Troponin:   Recent  Labs  Lab 10/26/19 1600 10/26/19 1804  TROPONINIHS 124* 133*     Cardiac EnzymesNo results for input([name]):  TROPONINI in the last 168 hours. No results for input([name]): TROPIPOC in the last 168 hours.  Chemistry Recent Labs  Lab 10/26/19 1600  NA 139  K 5.3*  CL 104  CO2 26  GLUCOSE 100*  BUN 30*  CREATININE 1.92*  CALCIUM 8.9  GFRNONAA 38*  GFRAA 44*  ANIONGAP 9    Recent Labs  Lab 10/26/19 1600  PROT 5.8*  ALBUMIN 2.8*  AST 41  ALT 56*  ALKPHOS 64  BILITOT 0.6   Hematology Recent Labs  Lab 10/26/19 1525 10/26/19 1804  WBC 11.9* 11.8*  RBC 5.02 5.18  HGB 14.9 15.7  HCT 47.4 49.4  MCV 94.4 95.4  MCH 29.7 30.3  MCHC 31.4 31.8  RDW 15.3 15.3  PLT 240 252   BNP Recent Labs  Lab 10/26/19 1527  BNP 1,823.3*    DDimer No results for input([name]): DDIMER in the last 168 hours.  Radiology/Studies:  DG Chest 2 View  Result Date: 10/26/2019 CLINICAL DATA:  Shortness of breath EXAM: CHEST - 2 VIEW COMPARISON:  Chest radiograph and chest CT August 10, 2018 FINDINGS: There is no appreciable edema or airspace opacity. There is cardiomegaly with pulmonary vascularity normal. No adenopathy. There is aortic atherosclerosis. No appreciable bone lesions. IMPRESSION: Cardiomegaly. No edema or airspace opacity. No evident adenopathy. Aortic Atherosclerosis (ICD10-I70.0). Electronically Signed   By: Lowella Grip III M.D.   On: 10/26/2019 16:17    Assessment and Plan:  1. Hypertensive Crisis with Volume overload: He was severely hypertensive with diastolic pressures in the 120.  Chest x-ray is with pulmonary edema.  BNP elevated.  This is likely fitting for hypertensive crisis with associated volume overload.  Likely his blood pressure set him over into heart failure exacerbation.  Unclear what his ejection fraction currently is as the most recent 1 in 2019 at [facility] was around 40%.  Regardless he has plenty of blood pressure.  I would recommend he continue his nitroglycerin drip.  He was given 40 mg of IV Lasix with 2.2 L of urine output.  We will increase his dose to 80 mg twice daily  tomorrow.  It is okay to continue his Coreg 25 mg twice daily.  Given his CKD I am a little reluctant to give him an ARB.  I have held that tonight.  We will start hydralazine 100 mg 3 times daily.  We will obtain an echocardiogram in the morning.  He likely be a good candidate for hydralazine/Imdur which is the equivalent of BiDil.  We will see how he does where his kidney function falls.  We will also get a better idea of medication titration needs based on his echocardiogram. 2. Chest pain/[place] ischemia/elevated troponin: Troponin elevated in the setting of hypertensive crisis and volume overload.  He has atypical right-sided chest pain.  This is likely related to his volume overload and shortness of breath.  He did have a left heart catheterization in 2015 per records that was normal.  I would not recommend heparin at this time.  I recommend to treat his heart failure and high blood pressure.  Would recommend continue home aspirin and statin for now. 3. Nonischemic cardiomyopathy: Longstanding history of nonischemic cardiomyopathy in the past.  Possibly related to hypertension and cocaine abuse.  He has had issues with noncompliance.  For now we will continue with medical therapy optimization as above.  For questions or updates, please contact [place] Please consult [email or web address] for contact info under   Signed, [place] T. Audie Box, [place]  10/26/2019 7:05 PM

## 2019-10-26 NOTE — ED Triage Notes (Signed)
Patient presents to ed via POV c/o sob and abd. Pain states it been going on for several days. States he used cocaine last pm for his abd. Pain. Exp. Wheezing states he used his inhalers without relif. Wife at bedside.

## 2019-10-26 NOTE — ED Notes (Signed)
Admitting doctor at   The bedside  She gave the pt food

## 2019-10-26 NOTE — ED Notes (Signed)
Lasix given 

## 2019-10-26 NOTE — ED Provider Notes (Signed)
[facility] EMERGENCY DEPARTMENT Provider Note   CSN: [id number] Arrival date & time: 10/26/19  1440     History Chief Complaint  Patient presents with  . Shortness of Breath    Chris [name] is a 59 y.o. male.  The history is provided by the patient and medical records. No language interpreter was used.  Shortness of Breath    59 year old male with history of polysubstance abuse including cocaine and cannabis abuse, chronic back pain, CHF, CKD, COPD presenting complaining of shortness of breath.  Patient report progressive worsening shortness of breath ongoing for the past month.  Shortness of breath worse when he exerts himself as well as when he sleeps.  Symptoms most noticeable at nighttime keeping him up from sleep.  He also endorsed feeling increased fluid retention to his abdomen and his legs.  He also endorsed wheezing not adequately controlled with his home inhaler.  He endorsed abdominal tightness from the swelling.  He also admits to using cocaine last use was last night to help with his abdominal pain.  He has had his Covid vaccination.  He denies any fever, chills, runny nose, sneezing, nausea, vomiting, loss of taste or smell or rash.  He is not on any diuretic.  He has not been able to meet his primary care provider due to COVID-19 restriction.  Past Medical History:  Diagnosis Date  . CHF (congestive heart failure) ([place])   . Chronic kidney disease   . COPD (chronic obstructive pulmonary disease) ([place])   . Depression   . Gout   . Hypertension   . Influenza A with respiratory manifestations   . Mental disorder     Patient Active Problem List   Diagnosis Date Noted  . History of gout 02/01/2019  . Seasonal allergic rhinitis due to pollen 02/01/2019  . Tobacco dependence 11/30/2018  . Microscopic hematuria 11/30/2018  . Depression 11/30/2018  . Difficulty controlling anger 11/30/2018  . COPD (chronic obstructive pulmonary disease) ([place])   .  CKD (chronic kidney disease) stage 3, GFR 30-59 ml/min 08/10/2018  . Recurrent epistaxis 04/21/2018  . Mixed hyperlipidemia 07/28/2017  . Essential hypertension 07/28/2017  . Chronic systolic heart failure ([place]) 10/25/2014  . Cocaine abuse ([place]) 02/20/2013  . Cannabis abuse 02/20/2013  . Back pain, chronic 02/20/2013    Past Surgical History:  Procedure Laterality Date  . ANKLE SURGERY    . CARDIAC CATHETERIZATION    . SHOULDER SURGERY         Family History  Problem Relation Age of Onset  . Heart disease Father     Social History   Tobacco Use  . Smoking status: Current Every Day Smoker    Packs/day: 1.00    Years: 20.00    Pack years: 20.00    Types: Cigarettes  . Smokeless tobacco: Never Used  . Tobacco comment: 3/4 of a pack  Substance Use Topics  . Alcohol use: No  . Drug use: Yes    Frequency: 21.0 times per week    Types: Marijuana, Cocaine    Comment: no longer- Cocaine    Home Medications Prior to Admission medications   Medication Sig Start Date End Date Taking? Authorizing Provider  albuterol (PROVENTIL) (2.5 MG/3ML) 0.083% nebulizer solution Take 3 mLs (2.5 mg total) by nebulization every 6 (six) hours as needed for wheezing or shortness of breath (or cough). 09/11/19   Mayers, Cari S, PA-C  albuterol (VENTOLIN HFA) 108 (90 Base) MCG/ACT inhaler Inhale 2 puffs  into the lungs every 6 (six) hours as needed for wheezing or shortness of breath. 09/11/19   Mayers, Cari S, PA-C  allopurinol (ZYLOPRIM) 100 MG tablet Take 1 tablet (100 mg total) by mouth daily. 07/09/19   [name], NP  amLODipine (NORVASC) 10 MG tablet Take 1 tablet (10 mg total) by mouth daily. 07/09/19   [name], NP  atorvastatin (LIPITOR) 40 MG tablet Take 1 tablet (40 mg total) by mouth daily at 6 PM. 07/09/19   [name], NP  azithromycin (ZITHROMAX) 250 MG tablet Take 2 today then 1 daily 10/17/19   [name], PA-C  benzonatate (TESSALON) 100 MG capsule Take 2  capsules (200 mg total) by mouth 3 (three) times daily as needed. 10/17/19   [name], PA-C  Blood Pressure Monitor DEVI Please provide patient with insurance approved blood pressure monitor 07/09/19   [name], NP  budesonide-formoterol [facility]) 160-4.5 MCG/ACT inhaler Inhale 2 puffs into the lungs 2 (two) times daily. 09/11/19   Mayers, Cari S, PA-C  carvedilol (COREG) 25 MG tablet Take 1 tablet (25 mg total) by mouth 2 (two) times daily with a meal. 07/09/19 09/07/19  [name], NP  fluticasone (FLONASE) 50 MCG/ACT nasal spray Place 1 spray into both nostrils daily as needed for allergies or rhinitis. 09/11/19   Mayers, Cari S, PA-C  loratadine (CLARITIN) 10 MG tablet Take 1 tablet (10 mg total) by mouth daily. 09/11/19   Mayers, Cari S, PA-C  losartan (COZAAR) 50 MG tablet Take 1 tablet (50 mg total) by mouth daily. 07/09/19   [name], NP  montelukast (SINGULAIR) 10 MG tablet Take 1 tablet (10 mg total) by mouth at bedtime. 09/11/19 03/09/20  Mayers, Cari S, PA-C  nicotine (NICODERM CQ - DOSED IN MG/24 HOURS) 21 mg/24hr patch Place 1 patch (21 mg total) onto the skin daily. 09/11/19   Mayers, Cari S, PA-C  predniSONE (DELTASONE) 10 MG tablet Take 4 tabs PO for 5 days then take 1 tab PO for 10 days 09/11/19   Mayers, Cari S, PA-C  traZODone (DESYREL) 50 MG tablet Take 0.5-1 tablets (25-50 mg total) by mouth at bedtime as needed for sleep. 10/17/19   [name], PA-C    Allergies    Patient has no known allergies.  Review of Systems   Review of Systems  Respiratory: Positive for shortness of breath.   All other systems reviewed and are negative.   Physical Exam Updated Vital Signs BP (!) 167/126 (BP Location: Right Arm)   Pulse (!) 101   Temp 98.9 F (37.2 C) (Oral)   Resp (!) 32   Ht 6' (1.829 m)   Wt 122.5 kg   SpO2 94%   BMI 36.62 kg/m   Physical Exam Vitals and nursing note reviewed.  Constitutional:      General: He is not in acute distress.     Appearance: He is well-developed. He is obese.     Comments: Obese male appears to be in moderate respiratory discomfort, tachypneic.  HENT:     Head: Atraumatic.  Eyes:     Conjunctiva/sclera: Conjunctivae normal.  Cardiovascular:     Rate and Rhythm: Tachycardia present.  Pulmonary:     Effort: Tachypnea present.     Breath sounds: No stridor. No decreased breath sounds, wheezing, rhonchi or rales.  Chest:     Chest wall: No tenderness.  Abdominal:     Tenderness: There is abdominal tenderness.  Comments: Abdomen is distended and mildly diffusely tender.  Musculoskeletal:     Cervical back: Neck supple.     Right lower leg: Edema present.     Left lower leg: Edema present.     Comments: 1+ pitting edema to bilateral lower extremities.  Skin:    Findings: No rash.  Neurological:     General: No focal deficit present.     Mental Status: He is alert.  Psychiatric:        Mood and Affect: Mood is anxious.     ED Results / Procedures / Treatments   Labs (all labs ordered are listed, but only abnormal results are displayed) Labs Reviewed  CBC - Abnormal; Notable for the following components:      Result Value   WBC 11.9 (*)    All other components within normal limits  BRAIN NATRIURETIC PEPTIDE - Abnormal; Notable for the following components:   B Natriuretic Peptide 1,823.3 (*)    All other components within normal limits  BASIC METABOLIC PANEL - Abnormal; Notable for the following components:   Potassium 5.3 (*)    Glucose, Bld 100 (*)    BUN 30 (*)    Creatinine, Ser 1.92 (*)    GFR calc non Af Amer 38 (*)    GFR calc Af Amer 44 (*)    All other components within normal limits  HEPATIC FUNCTION PANEL - Abnormal; Notable for the following components:   Total Protein 5.8 (*)    Albumin 2.8 (*)    ALT 56 (*)    Bilirubin, Direct 0.3 (*)    All other components within normal limits  TROPONIN I (HIGH SENSITIVITY) - Abnormal; Notable for the following components:     Troponin I (High Sensitivity) 124 (*)    All other components within normal limits  SARS CORONAVIRUS 2 BY RT PCR (HOSPITAL ORDER, [facility])  LIPASE, BLOOD  CBC  POC SARS CORONAVIRUS 2 AG -  ED  TROPONIN I (HIGH SENSITIVITY)    EKG EKG Interpretation  Date/Time:  Friday Oct 26 2019 18:14:27 EDT Ventricular Rate:  95 PR Interval:    QRS Duration: 102 QT Interval:  388 QTC Calculation: 488 R Axis:   -11 Text Interpretation: Sinus rhythm Biatrial enlargement Abnormal T, consider ischemia, lateral leads Confirmed by Davonna Belling [phone]) on 10/26/2019 6:23:42 PM   Radiology DG Chest 2 View  Result Date: 10/26/2019 CLINICAL DATA:  Shortness of breath EXAM: CHEST - 2 VIEW COMPARISON:  Chest radiograph and chest CT August 10, 2018 FINDINGS: There is no appreciable edema or airspace opacity. There is cardiomegaly with pulmonary vascularity normal. No adenopathy. There is aortic atherosclerosis. No appreciable bone lesions. IMPRESSION: Cardiomegaly. No edema or airspace opacity. No evident adenopathy. Aortic Atherosclerosis (ICD10-I70.0). Electronically Signed   By: Lowella Grip III M.D.   On: 10/26/2019 16:17    Procedures .Critical Care Performed by: Domenic Moras, PA-C Authorized by: Domenic Moras, PA-C   Critical care provider statement:    Critical care time (minutes):  32   Critical care was time spent personally by me on the following activities:  Discussions with consultants, evaluation of patient's response to treatment, examination of patient, ordering and performing treatments and interventions, ordering and review of laboratory studies, ordering and review of radiographic studies, pulse oximetry, re-evaluation of patient's condition, obtaining history from patient or surrogate and review of old charts   (including critical care time)  Medications Ordered in ED Medications  sodium chloride flush (NS) 0.9 % injection 3 mL (has no  administration in time range)  nitroGLYCERIN 50 mg in dextrose 5 % 250 mL (0.2 mg/mL) infusion (has no administration in time range)  sodium zirconium cyclosilicate (LOKELMA) packet 10 g (has no administration in time range)  furosemide (LASIX) injection 40 mg (40 mg Intravenous Given 10/26/19 1625)    ED Course  I have reviewed the triage vital signs and the nursing notes.  Pertinent labs & imaging results that were available during my care of the patient were reviewed by me and considered in my medical decision making (see chart for details).    MDM Rules/Calculators/A&P                      BP (!) 158/123   Pulse 81   Temp 98.9 F (37.2 C) (Oral)   Resp (!) 35   Ht 6' (1.829 m)   Wt 122.5 kg   SpO2 97%   BMI 36.62 kg/m   Final Clinical Impression(s) / ED Diagnoses Final diagnoses:  Acute on chronic congestive heart failure, unspecified heart failure type (HCC)  Elevated troponin  Hyperkalemia  Demand ischemia ([place])    Rx / DC Orders ED Discharge Orders    None     3:41 PM Patient with progressive shortness of breath and fluid retention concerning for CHF exacerbation.  Patient has had his Covid vaccination.  He appears to be in moderate respiratory discomfort on examination with evidence of fluid overload including abdominal and lower extremity anasarca.  BNP today is 1823.  Fortunately chest x-ray showed no evidence of edema or airspace opacity.  Will give Lasix, nitro drip, and if Covid test is negative, will consider BiPAP as well. Care discussed with DR. Pickering.   6:21 PM Elevated K+ of 5.3 will give lokelma.  EKG obtained with finding consistent of CHF exacerbation.  Cardiologist Dr. Ellyn Hack was curbside with the EKG and agrees. Elevated trop likely reflecting demand ischemia, especially in the setting of cocaine use.  Appreciate consultation from [facility] Dr. Evangeline Gula who agrees to admit pt.     Chris [name] was evaluated in Emergency Department on  10/26/2019 for the symptoms described in the history of present illness. He was evaluated in the context of the global COVID-19 pandemic, which necessitated consideration that the patient might be at risk for infection with the SARS-CoV-2 virus that causes COVID-19. Institutional protocols and algorithms that pertain to the evaluation of patients at risk for COVID-19 are in a state of rapid change based on information released by regulatory bodies including the CDC and federal and state organizations. These policies and algorithms were followed during the patient's care in the ED.    Domenic Moras, PA-C 10/26/19 Lennie Hummer, MD 10/26/19 [phone]

## 2019-10-26 NOTE — H&P (Signed)
History and Physical    Chris [name] [id number] DOB: 1961-03-21 DOA: 10/26/2019  PCP: [name], MD  Patient coming from: Home  I have personally briefly reviewed patient's old medical records in [place]  Chief Complaint: Shortness of breath and abdominal pain for several days.  HPI: Chris [name] is a 59 y.o. male with medical history significant of Polysubstance abuse including cocaine and cannabis, chronic back pain, who presents complaining of shortness of breath and abdominal pain for several days.  He reports worsening shortness of breath over the past month.  States that he took cocaine last night to help with the abdominal pain.  As of breath is worse when he exerts himself as well as when he lies flat sleeping.  Symptoms are most notable at night which awaken him from sleep.  He has felt like he has had increased fluid retention in his abdomen and his legs.  He has had some wheezing but that has not been adequately controlled with his home inhaler.  He has abdominal tightness from the swelling and admits to using cocaine last night to help with the abdominal pain.  He also complains of pain in the right lower rib cage it is intermittent and is not associated with breathing and it is moderate in nature.  Patient has had his Covid vaccination.  He denies any fevers, chills, nausea, vomiting, cough, sputum production, runny nose, sneezing, loss of taste smell or rash.  Currently not taking any diuretic.  Saw his primary care provider on May 5 and was prescribed Z-Pak and some steroids for an upper respiratory infection.  He has not been seen in person in quite some time.  ED Course: Patient in moderate respiratory discomfort on examination with fluid overload and lower extremity edema.  BNP is 1823.  Dust x-ray without edema or airspace opacity.  IV Lasix was given as well as a nitro drip.  Potassium was slightly elevated at pot 5.3 Lokelma was given.  Patient with  elevated troponin of 123.  EKG shows sinus rhythm with biatrial enlargement and abnormal T waves.  Troponin elevation likely reflects manned ischemia.  Patient referred to [facility] hospitalist for admission  Review of Systems: As per HPI otherwise all other systems reviewed and  negative.   Past Medical History:  Diagnosis Date  . CHF (congestive heart failure) ([place])   . Chronic kidney disease   . COPD (chronic obstructive pulmonary disease) ([place])   . Depression   . Gout   . Hypertension   . Influenza A with respiratory manifestations   . Mental disorder     Past Surgical History:  Procedure Laterality Date  . ANKLE SURGERY    . CARDIAC CATHETERIZATION    . SHOULDER SURGERY      Social History   Social History Narrative   ** Merged History Encounter **         reports that he has been smoking cigarettes. He has a 20.00 pack-year smoking history. He has never used smokeless tobacco. He reports current drug use. Frequency: 21.00 times per week. Drugs: Marijuana and Cocaine. He reports that he does not drink alcohol.  No Known Allergies  Family History  Problem Relation Age of Onset  . Heart disease Father      Prior to Admission medications   Medication Sig Start Date End Date Taking? Authorizing Provider  albuterol (PROVENTIL) (2.5 MG/3ML) 0.083% nebulizer solution Take 3 mLs (2.5 mg total) by nebulization every 6 (six)  hours as needed for wheezing or shortness of breath (or cough). 09/11/19  Yes Mayers, Cari S, PA-C  albuterol (VENTOLIN HFA) 108 (90 Base) MCG/ACT inhaler Inhale 2 puffs into the lungs every 6 (six) hours as needed for wheezing or shortness of breath. 09/11/19  Yes Mayers, Cari S, PA-C  budesonide-formoterol (SYMBICORT) 160-4.5 MCG/ACT inhaler Inhale 2 puffs into the lungs 2 (two) times daily. 09/11/19  Yes Mayers, Cari S, PA-C  allopurinol (ZYLOPRIM) 100 MG tablet Take 1 tablet (100 mg total) by mouth daily. 07/09/19   [name], NP  amLODipine (NORVASC)  10 MG tablet Take 1 tablet (10 mg total) by mouth daily. 07/09/19   [name], NP  atorvastatin (LIPITOR) 40 MG tablet Take 1 tablet (40 mg total) by mouth daily at 6 PM. 07/09/19   [name], NP  benzonatate (TESSALON) 100 MG capsule Take 2 capsules (200 mg total) by mouth 3 (three) times daily as needed. Patient taking differently: Take 200 mg by mouth 3 (three) times daily as needed for cough.  10/17/19   [name], PA-C  Blood Pressure Monitor DEVI Please provide patient with insurance approved blood pressure monitor 07/09/19   [name], NP  carvedilol (COREG) 25 MG tablet Take 1 tablet (25 mg total) by mouth 2 (two) times daily with a meal. 07/09/19 09/07/19  [name], NP  fluticasone (FLONASE) 50 MCG/ACT nasal spray Place 1 spray into both nostrils daily as needed for allergies or rhinitis. 09/11/19   Mayers, Cari S, PA-C  loratadine (CLARITIN) 10 MG tablet Take 1 tablet (10 mg total) by mouth daily. 09/11/19   Mayers, Cari S, PA-C  losartan (COZAAR) 50 MG tablet Take 1 tablet (50 mg total) by mouth daily. 07/09/19   [name], NP  montelukast (SINGULAIR) 10 MG tablet Take 1 tablet (10 mg total) by mouth at bedtime. 09/11/19 03/09/20  Mayers, Cari S, PA-C  nicotine (NICODERM CQ - DOSED IN MG/24 HOURS) 21 mg/24hr patch Place 1 patch (21 mg total) onto the skin daily. 09/11/19   Mayers, Cari S, PA-C  predniSONE (DELTASONE) 10 MG tablet Take 4 tabs PO for 5 days then take 1 tab PO for 10 days Patient taking differently: Take 10 mg by mouth See admin instructions. Take 4 tabs PO for 5 days then take 1 tab PO for 10 days 09/11/19   Mayers, Cari S, PA-C  traZODone (DESYREL) 50 MG tablet Take 0.5-1 tablets (25-50 mg total) by mouth at bedtime as needed for sleep. 10/17/19   [name], PA-C    Physical Exam:  Constitutional: Accessory muscle use with grunting on expiration.  Very uncomfortable. Vitals:   10/26/19 1545 10/26/19 1600 10/26/19 1615 10/26/19 1730    BP: (!) 164/126 (!) 167/126 (!) 152/127 (!) 158/123  Pulse: 91 80 99 81  Resp: (!) 27 (!) 28 (!) 41 (!) 35  Temp:      TempSrc:      SpO2: 94% 98% 93% 97%  Weight:      Height:       Eyes: PERRL, lids and conjunctivae normal ENMT: Mucous membranes are moist. Posterior pharynx clear of any exudate or lesions.Normal dentition.  Neck: normal, supple, no masses, no thyromegaly Respiratory: Increased respiratory effort with bilateral wheezing and rales at the bases halfway up.  Patient with accessory muscle use. Cardiovascular: Regular rate and rhythm, no murmurs / rubs / gallops.  Anasarca to mid thighs, buttocks not tense with edema. 2+  pedal pulses. No carotid bruits.  Abdomen: no tenderness, no masses palpated. No hepatosplenomegaly. Bowel sounds positive.  Musculoskeletal: no clubbing / cyanosis. No joint deformity upper and lower extremities. Good ROM, no contractures. Normal muscle tone.  Skin: no rashes, lesions, ulcers. No induration Neurologic: CN 2-12 grossly intact. Sensation intact, DTR normal. Strength 5/5 in all 4.  Psychiatric: Normal judgment and insight. Alert and oriented x 3. Normal mood.    Labs on Admission: I have personally reviewed following labs and imaging studies  CBC: Recent Labs  Lab 10/26/19 1525 10/26/19 1804  WBC 11.9* 11.8*  HGB 14.9 15.7  HCT 47.4 49.4  MCV 94.4 95.4  PLT 240 [id number]   Basic Metabolic Panel: Recent Labs  Lab 10/26/19 1600  NA 139  K 5.3*  CL 104  CO2 26  GLUCOSE 100*  BUN 30*  CREATININE 1.92*  CALCIUM 8.9   GFR: Estimated Creatinine Clearance: 57.4 mL/min (A) (by C-G formula based on SCr of 1.92 mg/dL (H)). Liver Function Tests: Recent Labs  Lab 10/26/19 1600  AST 41  ALT 56*  ALKPHOS 64  BILITOT 0.6  PROT 5.8*  ALBUMIN 2.8*   Recent Labs  Lab 10/26/19 1600  LIPASE 29   Urine analysis:    Component Value Date/Time   COLORURINE STRAW (A) 08/10/2018 2230   APPEARANCEUR Clear 11/30/2018 1035   LABSPEC  1.005 08/10/2018 2230   PHURINE 6.0 08/10/2018 2230   GLUCOSEU Negative 11/30/2018 1035   HGBUR LARGE (A) 08/10/2018 2230   BILIRUBINUR Negative 11/30/2018 1035   KETONESUR NEGATIVE 08/10/2018 2230   PROTEINUR 1+ (A) 11/30/2018 1035   PROTEINUR 100 (A) 08/10/2018 2230   NITRITE Negative 11/30/2018 1035   NITRITE NEGATIVE 08/10/2018 2230   LEUKOCYTESUR Negative 11/30/2018 1035   LEUKOCYTESUR NEGATIVE 08/10/2018 2230    Radiological Exams on Admission: DG Chest 2 View  Result Date: 10/26/2019 CLINICAL DATA:  Shortness of breath EXAM: CHEST - 2 VIEW COMPARISON:  Chest radiograph and chest CT August 10, 2018 FINDINGS: There is no appreciable edema or airspace opacity. There is cardiomegaly with pulmonary vascularity normal. No adenopathy. There is aortic atherosclerosis. No appreciable bone lesions. IMPRESSION: Cardiomegaly. No edema or airspace opacity. No evident adenopathy. Aortic Atherosclerosis (ICD10-I70.0). Electronically Signed   By: Lowella Grip III M.D.   On: 10/26/2019 16:17    EKG: Independently reviewed.  Sinus rhythm with biatrial enlargement and abnormal T wave suggestive of possible ischemia.  Assessment/Plan Active Problems:   Acute on chronic combined systolic and diastolic CHF (congestive heart failure) (HCC)   Elevated troponin I level   Cocaine abuse (HCC)   Essential hypertension   CKD (chronic kidney disease) stage 3, GFR 30-59 ml/min   COPD (chronic obstructive pulmonary disease) (HCC)   Tobacco dependence   Acute on chronic diastolic (congestive) heart failure ([place])    1.  Acute on chronic combined systolic and diastolic congestive heart failure: Given that patient is having abdominal swelling I suspect that this is diastolic in nature.  Previously had been diagnosed with systolic congestive heart failure.  I will obtain an echocardiogram to better determine the etiology of his heart failure.  We will continue nitroglycerin drip and admit patient to  progressive care unit.  Start Lasix 40 mg IV twice daily.  Daily weights, fluid restriction of 1800 mL per 24 hours.  2.  Elevated troponin level: Likely related to strain from cocaine use and heart failure.  Troponin is pending.  Cardiology has been consulted.  EKG is  not suggestive of acute cardiac event.  There may be some strain with some mild ischemia.  3.  Cocaine abuse: Noted causing problems with cardiac demand.  Suspect elevated troponin and heart failure not helped by the cocaine.  Patient's friend who is present in the room has been encouraging him to go into rehab.  I will consult transitions of care for assistance with referral to outpatient drug rehab program.  4.  Essential hypertension: Pressure is markedly elevated.  Patient on nitroglycerin drip.  We will continue home medications.  Titrate nitroglycerin drip to keep blood pressure stable.  5.  Chronic kidney disease stage III: Patient on very tenuous path.  I am concerned that if his blood pressures remain elevated he will develop further chronic kidney disease and end up with a cardiorenal syndrome.  Will avoid nephrotoxic agents.  Offload kidneys with diuresis.  6.  COPD: Continue inhalers as at home.  I suspect patient's wheezing is not related to COPD but rather related to volume and heart failure.  7.  Tobacco dependence: Continue nicotine patch.  Not sure if patient has been using it at home.  8.  Disposition: Likely home  DVT prophylaxis: Subcu heparin Code Status: Full code Family Communication: Spoke with patient's friend who was present in the room.  He did not request communication with anyone else. Disposition Plan: Likely home in 3 to 4 days Consults called: Cardiology Dr. Davina Poke Admission status: Admit - It is my clinical opinion that admission to INPATIENT is reasonable and necessary because of the expectation that this patient will require hospital care that crosses at least 2 midnights to treat this condition  based on the medical complexity of the problems presented.  Given the aforementioned information, the predictability of an adverse outcome is felt to be significant.    Lady Deutscher MD FACP [facility] Pager [phone]  How to contact the [facility] Attending or Consulting provider [place] or covering provider during after hours [place], for this patient?  1. Check the care team in [facility] and look for a) attending/consulting [facility] provider listed and b) the [facility] team listed 2. Log into [email or web address] and use [place]'s universal password to access. If you do not have the password, please contact the hospital operator. 3. Locate the [facility] provider you are looking for under [facility] and page to a number that you can be directly reached. 4. If you still have difficulty reaching the provider, please page the [facility] (Director on Call) for the Hospitalists listed on amion for assistance.  If 7PM-7AM, please contact night-coverage [email or web address] Password [facility]  10/26/2019, 6:54 PM

## 2019-10-27 ENCOUNTER — Inpatient Hospital Stay (HOSPITAL_COMMUNITY): Payer: Medicaid Other

## 2019-10-27 DIAGNOSIS — I169 Hypertensive crisis, unspecified: Secondary | ICD-10-CM

## 2019-10-27 DIAGNOSIS — I5021 Acute systolic (congestive) heart failure: Secondary | ICD-10-CM

## 2019-10-27 DIAGNOSIS — I10 Essential (primary) hypertension: Secondary | ICD-10-CM

## 2019-10-27 DIAGNOSIS — J449 Chronic obstructive pulmonary disease, unspecified: Secondary | ICD-10-CM

## 2019-10-27 LAB — CBC WITH DIFFERENTIAL/PLATELET
Abs Immature Granulocytes: 0.1 10*3/uL — ABNORMAL HIGH (ref 0.00–0.07)
Basophils Absolute: 0.1 10*3/uL (ref 0.0–0.1)
Basophils Relative: 1 %
Eosinophils Absolute: 0.2 10*3/uL (ref 0.0–0.5)
Eosinophils Relative: 2 %
HCT: 46.6 % (ref 39.0–52.0)
Hemoglobin: 14.6 g/dL (ref 13.0–17.0)
Immature Granulocytes: 1 %
Lymphocytes Relative: 20 %
Lymphs Abs: 2 10*3/uL (ref 0.7–4.0)
MCH: 29.7 pg (ref 26.0–34.0)
MCHC: 31.3 g/dL (ref 30.0–36.0)
MCV: 94.9 fL (ref 80.0–100.0)
Monocytes Absolute: 0.8 10*3/uL (ref 0.1–1.0)
Monocytes Relative: 8 %
Neutro Abs: 6.9 10*3/uL (ref 1.7–7.7)
Neutrophils Relative %: 68 %
Platelets: 223 10*3/uL (ref 150–400)
RBC: 4.91 MIL/uL (ref 4.22–5.81)
RDW: 15.1 % (ref 11.5–15.5)
WBC: 10 10*3/uL (ref 4.0–10.5)
nRBC: 0 % (ref 0.0–0.2)

## 2019-10-27 LAB — BLOOD GAS, ARTERIAL
Acid-Base Excess: 5.1 mmol/L — ABNORMAL HIGH (ref 0.0–2.0)
Bicarbonate: 30.7 mmol/L — ABNORMAL HIGH (ref 20.0–28.0)
FIO2: 32
O2 Saturation: 95.1 %
Patient temperature: 36.5
pCO2 arterial: 58.4 mmHg — ABNORMAL HIGH (ref 32.0–48.0)
pH, Arterial: 7.337 — ABNORMAL LOW (ref 7.350–7.450)
pO2, Arterial: 82.9 mmHg — ABNORMAL LOW (ref 83.0–108.0)

## 2019-10-27 LAB — COMPREHENSIVE METABOLIC PANEL
ALT: 56 U/L — ABNORMAL HIGH (ref 0–44)
AST: 34 U/L (ref 15–41)
Albumin: 2.8 g/dL — ABNORMAL LOW (ref 3.5–5.0)
Alkaline Phosphatase: 64 U/L (ref 38–126)
Anion gap: 12 (ref 5–15)
BUN: 32 mg/dL — ABNORMAL HIGH (ref 6–20)
CO2: 26 mmol/L (ref 22–32)
Calcium: 8.7 mg/dL — ABNORMAL LOW (ref 8.9–10.3)
Chloride: 101 mmol/L (ref 98–111)
Creatinine, Ser: 1.87 mg/dL — ABNORMAL HIGH (ref 0.61–1.24)
GFR calc Af Amer: 45 mL/min — ABNORMAL LOW (ref >60)
GFR calc non Af Amer: 39 mL/min — ABNORMAL LOW (ref >60)
Glucose, Bld: 106 mg/dL — ABNORMAL HIGH (ref 70–99)
Potassium: 4.6 mmol/L (ref 3.5–5.1)
Sodium: 139 mmol/L (ref 135–145)
Total Bilirubin: 0.6 mg/dL (ref 0.3–1.2)
Total Protein: 5.9 g/dL — ABNORMAL LOW (ref 6.5–8.1)

## 2019-10-27 LAB — GLUCOSE, CAPILLARY: Glucose-Capillary: 109 mg/dL — ABNORMAL HIGH (ref 70–99)

## 2019-10-27 LAB — ECHOCARDIOGRAM COMPLETE
Height: 72 in
Weight: 4596.8 oz

## 2019-10-27 MED ORDER — HYDRALAZINE HCL 50 MG PO TABS
100.0000 mg | ORAL_TABLET | Freq: Once | ORAL | Status: AC
  Administered 2019-10-27: 100 mg via ORAL
  Filled 2019-10-27: qty 2

## 2019-10-27 MED ORDER — MORPHINE SULFATE (PF) 4 MG/ML IV SOLN
5.0000 mg | Freq: Once | INTRAVENOUS | Status: AC
  Administered 2019-10-27: 5 mg via INTRAVENOUS

## 2019-10-27 MED ORDER — MORPHINE SULFATE (PF) 4 MG/ML IV SOLN
5.0000 mg | Freq: Once | INTRAVENOUS | Status: AC
  Administered 2019-10-27: 5 mg via INTRAVENOUS
  Filled 2019-10-27: qty 2

## 2019-10-27 MED ORDER — LORAZEPAM 2 MG/ML IJ SOLN
0.5000 mg | Freq: Once | INTRAMUSCULAR | Status: AC
  Administered 2019-10-27: 0.5 mg via INTRAVENOUS
  Filled 2019-10-27: qty 1

## 2019-10-27 MED ORDER — LORAZEPAM 2 MG/ML IJ SOLN
INTRAMUSCULAR | Status: AC
  Filled 2019-10-27: qty 1

## 2019-10-27 MED ORDER — MORPHINE SULFATE (PF) 4 MG/ML IV SOLN
INTRAVENOUS | Status: AC
  Filled 2019-10-27: qty 2

## 2019-10-27 NOTE — Progress Notes (Signed)
PROGRESS NOTE    Chris [name]  [id number] DOB: August 02, 1960 DOA: 10/26/2019 PCP: [name], MD   Brief Narrative:  Per admitting MD: Chris [name] is a 59 y.o. male with medical history significant of Polysubstance abuse including cocaine and cannabis, chronic back pain, who presents complaining of shortness of breath and abdominal pain for several days.  He reports worsening shortness of breath over the past month.  States that he took cocaine last night to help with the abdominal pain.  As of breath is worse when he exerts himself as well as when he lies flat sleeping.  Symptoms are most notable at night which awaken him from sleep.  He has felt like he has had increased fluid retention in his abdomen and his legs.  He has had some wheezing but that has not been adequately controlled with his home inhaler.  He has abdominal tightness from the swelling and admits to using cocaine last night to help with the abdominal pain.  He also complains of pain in the right lower rib cage it is intermittent and is not associated with breathing and it is moderate in nature.  Patient has had his Covid vaccination.  He denies any fevers, chills, nausea, vomiting, cough, sputum production, runny nose, sneezing, loss of taste smell or rash.  Currently not taking any diuretic.  Saw his primary care provider on May 5 and was prescribed Z-Pak and some steroids for an upper respiratory infection.  He has not been seen in person in quite some time.  ED Course: Patient in moderate respiratory discomfort on examination with fluid overload and lower extremity edema.  BNP is 1823.  Dust x-ray without edema or airspace opacity.  IV Lasix was given as well as a nitro drip.  Potassium was slightly elevated at pot 5.3 Lokelma was given.  Patient with elevated troponin of 123.  EKG shows sinus rhythm with biatrial enlargement and abnormal T waves.  Troponin elevation likely reflects manned ischemia.  Patient referred  to [facility] hospitalist for admission   Assessment & Plan:   Active Problems:   Cocaine abuse (HCC)   Essential hypertension   CKD (chronic kidney disease) stage 3, GFR 30-59 ml/min   COPD (chronic obstructive pulmonary disease) (HCC)   Tobacco dependence   Acute on chronic combined systolic and diastolic CHF (congestive heart failure) (HCC)   Elevated troponin I level   Acute on chronic diastolic (congestive) heart failure ([place])   1.  Acute on chronic combined systolic and diastolic congestive heart failure:  -Diurese with lasix 80 bid -avoid ace/arb given ckd -TTE  2.  Elevated troponin level:  -AGREE, Likely related to strain from cocaine use and heart failure.  -CARDS SAW IN CONSULTATION-DEMAND ISCHEMIA  3.  Cocaine abuse:  -counseled abstinence  4.  Essential hypertension:  -wean nitro gtt -hydralazine 100 tid -coreg 25 bid  5.  Chronic kidney disease stage III:  -monitor closely -avoid renal toxic meds -no ace/arb for now.  6.  COPD:  -c/w home meds  7.  Tobacco dependence: Continue nicotine patch.  Not sure if patient has been using it at home.  8.  Disposition: Likely home  DVT prophylaxis: Heparin SQ  Code Status: full    Code Status Orders  (From admission, onward)         Start     Ordered   10/26/19 1850  Full code  Continuous     10/26/19 1854  Code Status History    Date Active Date Inactive Code Status Order ID Comments User Context   03/19/2014 0139 03/20/2014 1725 Full Code [id number]  Alvina Chou, PA-C ED   02/18/2013 1728 02/21/2013 1845 Full Code [id number]  Waylan Boga, NP Inpatient   02/18/2013 0438 02/18/2013 1728 Full Code [id number]  Pamella Pert, MD ED   Advance Care Planning Activity     Family Communication: none present  Consults called: cards Admission status:  Status is: Inpatient  Remains inpatient appropriate because:IV treatments appropriate due to intensity of illness or inability to take  PO   Dispo: The patient is from: Home              Anticipated d/c is to: Home              Anticipated d/c date is: 2 days              Patient currently is not medically stable to d/c.         Consultants:   cards  Procedures:  DG Chest 2 View  Result Date: 10/26/2019 CLINICAL DATA:  Shortness of breath EXAM: CHEST - 2 VIEW COMPARISON:  Chest radiograph and chest CT August 10, 2018 FINDINGS: There is no appreciable edema or airspace opacity. There is cardiomegaly with pulmonary vascularity normal. No adenopathy. There is aortic atherosclerosis. No appreciable bone lesions. IMPRESSION: Cardiomegaly. No edema or airspace opacity. No evident adenopathy. Aortic Atherosclerosis (ICD10-I70.0). Electronically Signed   By: Lowella Grip III M.D.   On: 10/26/2019 16:17   DG Chest Port 1 View  Result Date: 10/27/2019 CLINICAL DATA:  Shortness of breath EXAM: PORTABLE CHEST 1 VIEW COMPARISON:  10/26/2019 FINDINGS: Moderate cardiomegaly is unchanged. There is pulmonary vascular congestion without overt edema. No pleural effusion or pneumothorax. IMPRESSION: Cardiomegaly and pulmonary vascular congestion without overt edema. Electronically Signed   By: Ulyses Jarred M.D.   On: 10/27/2019 03:56     Antimicrobials:   none    Subjective: Still sob, on iv nitro, morbidly obes,, no acute events since admission  Objective: Vitals:   10/27/19 0739 10/27/19 0850 10/27/19 0924 10/27/19 1138  BP: (!) 148/115   123/86  Pulse: 98 90  86  Resp: (!) 26     Temp: (!) 97.5 F (36.4 C)   (!) 97.5 F (36.4 C)  TempSrc: Axillary   Oral  SpO2: 99% 97% 94% 99%  Weight:      Height:        Intake/Output Summary (Last 24 hours) at 10/27/2019 1229 Last data filed at 10/27/2019 1015 Gross per 24 hour  Intake 32.28 ml  Output 4275 ml  Net -4242.72 ml   Filed Weights   10/26/19 1518 10/26/19 2200  Weight: 122.5 kg 130.3 kg    Examination:  General exam: Appears calm and comfortable   Respiratory system: Clear to auscultation. Respiratory effort normal. Cardiovascular system: S1 & S2 heard, RRR. No JVD, murmurs, rubs, gallops or clicks. No pedal edema. Gastrointestinal system: Abdomen is nondistended, soft and nontender. No organomegaly or masses felt. Normal bowel sounds heard. Central nervous system: Alert and oriented. No focal neurological deficits. Extremities: 2+ pitting edema bilat LE Skin: No rashes, lesions or ulcers Psychiatry: Judgement and insight ARE POOR, Mood & affect appropriate.     Data Reviewed: I have personally reviewed following labs and imaging studies  CBC: Recent Labs  Lab 10/26/19 1525 10/26/19 1804 10/27/19 0357  WBC 11.9* 11.8* 10.0  NEUTROABS  --   --  6.9  HGB 14.9 15.7 14.6  HCT 47.4 49.4 46.6  MCV 94.4 95.4 94.9  PLT 240 252 [id number]   Basic Metabolic Panel: Recent Labs  Lab 10/26/19 1600 10/27/19 0357  NA 139 139  K 5.3* 4.6  CL 104 101  CO2 26 26  GLUCOSE 100* 106*  BUN 30* 32*  CREATININE 1.92* 1.87*  CALCIUM 8.9 8.7*   GFR: Estimated Creatinine Clearance: 60.8 mL/min (A) (by C-G formula based on SCr of 1.87 mg/dL (H)). Liver Function Tests: Recent Labs  Lab 10/26/19 1600 10/27/19 0357  AST 41 34  ALT 56* 56*  ALKPHOS 64 64  BILITOT 0.6 0.6  PROT 5.8* 5.9*  ALBUMIN 2.8* 2.8*   Recent Labs  Lab 10/26/19 1600  LIPASE 29   No results for input(s): AMMONIA in the last 168 hours. Coagulation Profile: No results for input(s): INR, PROTIME in the last 168 hours. Cardiac Enzymes: No results for input(s): CKTOTAL, CKMB, CKMBINDEX, TROPONINI in the last 168 hours. BNP (last 3 results) No results for input(s): PROBNP in the last 8760 hours. HbA1C: No results for input(s): HGBA1C in the last 72 hours. CBG: Recent Labs  Lab 10/27/19 0341  GLUCAP 109*   Lipid Profile: No results for input(s): CHOL, HDL, LDLCALC, TRIG, CHOLHDL, LDLDIRECT in the last 72 hours. Thyroid Function Tests: Recent Labs     10/26/19 2139  TSH 1.592   Anemia Panel: No results for input(s): VITAMINB12, FOLATE, FERRITIN, TIBC, IRON, RETICCTPCT in the last 72 hours. Sepsis Labs: No results for input(s): PROCALCITON, LATICACIDVEN in the last 168 hours.  Recent Results (from the past 240 hour(s))  SARS Coronavirus 2 by RT PCR (hospital order, performed in [facility] hospital lab) Nasopharyngeal Nasopharyngeal Swab     Status: None   Collection Time: 10/26/19  6:29 PM   Specimen: Nasopharyngeal Swab  Result Value Ref Range Status   SARS Coronavirus 2 NEGATIVE NEGATIVE Final    Comment: (NOTE) SARS-CoV-2 target nucleic acids are NOT DETECTED. The SARS-CoV-2 RNA is generally detectable in upper and lower respiratory specimens during the acute phase of infection. The lowest concentration of SARS-CoV-2 viral copies this assay can detect is 250 copies / mL. A negative result does not preclude SARS-CoV-2 infection and should not be used as the sole basis for treatment or other patient management decisions.  A negative result may occur with improper specimen collection / handling, submission of specimen other than nasopharyngeal swab, presence of viral mutation(s) within the areas targeted by this assay, and inadequate number of viral copies (<250 copies / mL). A negative result must be combined with clinical observations, patient history, and epidemiological information. Fact Sheet for Patients:   StrictlyIdeas.no Fact Sheet for Healthcare Providers: BankingDealers.co.za This test is not yet approved or cleared  by the [place] FDA and has been authorized for detection and/or diagnosis of SARS-CoV-2 by FDA under an Emergency Use Authorization (EUA).  This EUA will remain in effect (meaning this test can be used) for the duration of the COVID-19 declaration under Section 564(b)(1) of the Act, 21 U.S.C. section 360bbb-3(b)(1), unless the authorization is  terminated or revoked sooner. Performed at [facility], [place] [address], [place], [place] [address]          Radiology Studies: DG Chest 2 View  Result Date: 10/26/2019 CLINICAL DATA:  Shortness of breath EXAM: CHEST - 2 VIEW COMPARISON:  Chest radiograph and chest CT August 10, 2018 FINDINGS: There is no appreciable edema or airspace opacity.  There is cardiomegaly with pulmonary vascularity normal. No adenopathy. There is aortic atherosclerosis. No appreciable bone lesions. IMPRESSION: Cardiomegaly. No edema or airspace opacity. No evident adenopathy. Aortic Atherosclerosis (ICD10-I70.0). Electronically Signed   By: Lowella Grip III M.D.   On: 10/26/2019 16:17   DG Chest Port 1 View  Result Date: 10/27/2019 CLINICAL DATA:  Shortness of breath EXAM: PORTABLE CHEST 1 VIEW COMPARISON:  10/26/2019 FINDINGS: Moderate cardiomegaly is unchanged. There is pulmonary vascular congestion without overt edema. No pleural effusion or pneumothorax. IMPRESSION: Cardiomegaly and pulmonary vascular congestion without overt edema. Electronically Signed   By: Ulyses Jarred M.D.   On: 10/27/2019 03:56        Scheduled Meds: . aspirin EC  81 mg Oral Daily  . atorvastatin  40 mg Oral q1800  . carvedilol  25 mg Oral BID WC  . furosemide  80 mg Intravenous BID  . heparin  5,000 Units Subcutaneous Q8H  . hydrALAZINE  100 mg Oral Q8H  . mometasone-formoterol  2 puff Inhalation BID  . montelukast  10 mg Oral QHS  . nicotine  21 mg Transdermal Daily  . pneumococcal 23 valent vaccine  0.5 mL Intramuscular Tomorrow-1000  . sodium chloride flush  3 mL Intravenous Once  . sodium chloride flush  3 mL Intravenous Q12H   Continuous Infusions: . sodium chloride    . nitroGLYCERIN Stopped (10/27/19 1139)     LOS: 1 day    Time spent: [place]    Nicolette Bang, MD [facility]  If 7PM-7AM, please contact night-coverage  10/27/2019, 12:29 PM

## 2019-10-27 NOTE — Progress Notes (Signed)
*  PRELIMINARY RESULTS* Echocardiogram 2D Echocardiogram has been performed.  Chris [name] 10/27/2019, 11:47 AM

## 2019-10-27 NOTE — Progress Notes (Signed)
Pt noted to be very restless since his arrival to the floor; BP was elevated since he came to the hospital & came to unit with a NTG gtt. Later during shift pt was observed working even harder to breathe; lung sounds were not wet sounding but there was not much air movement either. Pt's RR climbed from 20's to 30's, then was noted to be in the 40's. Requested RT to see pt, charge nurse also aware of pt's change. On call provider also notified, orders received. Obtained stat cxr & ABG; IV morphine administered to help pt breathing. Pt placed on bipap by RT, within 10 minutes pt was noted to be breathing much easier, almost no accessory muscle use. Rapid response RN also notified, so that they can monitor pt's progress. NAD noted now, will continue to monitor.

## 2019-10-27 NOTE — Progress Notes (Signed)
Progress Note  Patient Name: CHIKAMSO COLALUCA Date of Encounter: 10/27/2019  Primary Cardiologist: No primary care provider on file.   Subjective  Required BiPAP overnight.  ABG showed 7.3 4/58/83/31.  BP improving, but remains elevated (148/115 this am).  Stable renal function (creatinine 1.9).  Net -3.2 L on IV Lasix 80 mg yesterday.  Remains on nitroglycerin drip at 15 mcg/min.  Did not get morning hydralazine dose.  Reports dyspnea improving.  Inpatient Medications    Scheduled Meds: . aspirin EC  81 mg Oral Daily  . atorvastatin  40 mg Oral q1800  . carvedilol  25 mg Oral BID WC  . furosemide  80 mg Intravenous BID  . heparin  5,000 Units Subcutaneous Q8H  . hydrALAZINE  100 mg Oral Q8H  . mometasone-formoterol  2 puff Inhalation BID  . montelukast  10 mg Oral QHS  . nicotine  21 mg Transdermal Daily  . pneumococcal 23 valent vaccine  0.5 mL Intramuscular Tomorrow-1000  . sodium chloride flush  3 mL Intravenous Once  . sodium chloride flush  3 mL Intravenous Q12H   Continuous Infusions: . sodium chloride    . nitroGLYCERIN 15 mcg/min (10/27/19 0808)   PRN Meds: sodium chloride, acetaminophen, albuterol, ALPRAZolam, benzonatate, fluticasone, ondansetron (ZOFRAN) IV, sodium chloride flush, traZODone   Vital Signs    Vitals:   10/27/19 0616 10/27/19 0739 10/27/19 0850 10/27/19 0924  BP:  (!) 148/115    Pulse: 100 98 90   Resp: (!) 27 (!) 26    Temp:  (!) 97.5 F (36.4 C)    TempSrc:  Axillary    SpO2: 98% 99% 97% 94%  Weight:      Height:        Intake/Output Summary (Last 24 hours) at 10/27/2019 1103 Last data filed at 10/27/2019 0600 Gross per 24 hour  Intake 32.28 ml  Output 3275 ml  Net -3242.72 ml   Last 3 Weights 10/26/2019 10/26/2019 11/30/2018  Weight (lbs) 287 lb 4.8 oz 270 lb 250 lb 9.6 oz  Weight (kg) 130.318 kg 122.471 kg 113.671 kg  Some encounter information is confidential and restricted. Go to Review Flowsheets activity to see all data.       Telemetry    Sinus rhythm, rate 80-110s, NSVT x 3 beats - Personally Reviewed  ECG    No new - Personally Reviewed  Physical Exam   GEN: No acute distress.   Neck: No JVD appreciated but difficult to assess given body habitus Cardiac: RRR, no murmurs Respiratory:  Diffuse rhonchi GI: Soft, nontender MS:  1+ edema Neuro:  Nonfocal  Psych: Normal affect   Labs    High Sensitivity Troponin:   Recent Labs  Lab 10/26/19 1600 10/26/19 1804  TROPONINIHS 124* 133*      Chemistry Recent Labs  Lab 10/26/19 1600 10/27/19 0357  NA 139 139  K 5.3* 4.6  CL 104 101  CO2 26 26  GLUCOSE 100* 106*  BUN 30* 32*  CREATININE 1.92* 1.87*  CALCIUM 8.9 8.7*  PROT 5.8* 5.9*  ALBUMIN 2.8* 2.8*  AST 41 34  ALT 56* 56*  ALKPHOS 64 64  BILITOT 0.6 0.6  GFRNONAA 38* 39*  GFRAA 44* 45*  ANIONGAP 9 12     Hematology Recent Labs  Lab 10/26/19 1525 10/26/19 1804 10/27/19 0357  WBC 11.9* 11.8* 10.0  RBC 5.02 5.18 4.91  HGB 14.9 15.7 14.6  HCT 47.4 49.4 46.6  MCV 94.4 95.4 94.9  MCH 29.7  30.3 29.7  MCHC 31.4 31.8 31.3  RDW 15.3 15.3 15.1  PLT 240 252 223    BNP Recent Labs  Lab 10/26/19 1527  BNP 1,823.3*     DDimer No results for input(s): DDIMER in the last 168 hours.   Radiology    DG Chest 2 View  Result Date: 10/26/2019 CLINICAL DATA:  Shortness of breath EXAM: CHEST - 2 VIEW COMPARISON:  Chest radiograph and chest CT August 10, 2018 FINDINGS: There is no appreciable edema or airspace opacity. There is cardiomegaly with pulmonary vascularity normal. No adenopathy. There is aortic atherosclerosis. No appreciable bone lesions. IMPRESSION: Cardiomegaly. No edema or airspace opacity. No evident adenopathy. Aortic Atherosclerosis (ICD10-I70.0). Electronically Signed   By: Lowella Grip III M.D.   On: 10/26/2019 16:17   DG Chest Port 1 View  Result Date: 10/27/2019 CLINICAL DATA:  Shortness of breath EXAM: PORTABLE CHEST 1 VIEW COMPARISON:  10/26/2019  FINDINGS: Moderate cardiomegaly is unchanged. There is pulmonary vascular congestion without overt edema. No pleural effusion or pneumothorax. IMPRESSION: Cardiomegaly and pulmonary vascular congestion without overt edema. Electronically Signed   By: Ulyses Jarred M.D.   On: 10/27/2019 03:56    Cardiac Studies     Patient Profile     59 y.o. male with a hx of nonischemic cardiomyopathy (EF was 20-25% but most recently documented at [facility] 40%, normal left heart cath in 2015 [facility]), CKD, cocaine abuse, tobacco abuse, hypertension   Assessment & Plan    Hypertensive Crisis with Volume overload: He was severely hypertensive with diastolic pressures in the 120.  Chest x-ray with pulmonary edema.  BNP elevated to 1823.  Most recent echo at [facility] showed EF 40% in 2019.   -Good response to IV Lasix 80 mg, will continue IV lasix 80 BID -Continue carvedilol 25 mg BID -Holding ACE/ARB/Arni given CKD -Continue hydralazine 100 mg 3 times daily.  This was held this morning, would give dose and continue to wean nitro gtt as tolerated -TTE today  Chest pain/Demand ischemia/elevated troponin: Troponin elevated in the setting of hypertensive crisis and volume overload.  He has atypical right-sided chest pain.  This is likely related to his volume overload and shortness of breath.  He did have a left heart catheterization in 2015 per records that was normal.  - Continue ASA, statin  Nonischemic cardiomyopathy: Longstanding history of nonischemic cardiomyopathy in the past.  Possibly related to hypertension and cocaine abuse.  He has had issues with noncompliance.  For now we will continue with medical therapy optimization as above.   For questions or updates, please contact [place] Please consult [email or web address] for contact info under        Signed, Donato Heinz, MD  10/27/2019, 11:03 AM

## 2019-10-28 LAB — BASIC METABOLIC PANEL
Anion gap: 13 (ref 5–15)
BUN: 33 mg/dL — ABNORMAL HIGH (ref 6–20)
CO2: 29 mmol/L (ref 22–32)
Calcium: 8.7 mg/dL — ABNORMAL LOW (ref 8.9–10.3)
Chloride: 97 mmol/L — ABNORMAL LOW (ref 98–111)
Creatinine, Ser: 2.27 mg/dL — ABNORMAL HIGH (ref 0.61–1.24)
GFR calc Af Amer: 36 mL/min — ABNORMAL LOW (ref >60)
GFR calc non Af Amer: 31 mL/min — ABNORMAL LOW (ref >60)
Glucose, Bld: 105 mg/dL — ABNORMAL HIGH (ref 70–99)
Potassium: 4.9 mmol/L (ref 3.5–5.1)
Sodium: 139 mmol/L (ref 135–145)

## 2019-10-28 MED ORDER — ISOSORB DINITRATE-HYDRALAZINE 20-37.5 MG PO TABS: 1.0000 | ORAL_TABLET | Freq: Three times a day (TID) | ORAL | Status: DC

## 2019-10-28 MED ORDER — ISOSORB DINITRATE-HYDRALAZINE 20-37.5 MG PO TABS
1.0000 | ORAL_TABLET | Freq: Three times a day (TID) | ORAL | Status: DC
  Administered 2019-10-28 – 2019-10-29 (×3): 1 via ORAL
  Filled 2019-10-28 (×3): qty 1

## 2019-10-28 NOTE — Progress Notes (Signed)
Progress Note  Patient Name: Chris [name] Date of Encounter: 10/28/2019  Primary Cardiologist: No primary care provider on file.   Subjective  Reports dyspnea improved.  Net negative 3.1L on IV lasix 80 mg BID yesterday.  Cr bumped from 1.9 ->2.3.   BP improved to 130/91 this morning.    Inpatient Medications    Scheduled Meds: . aspirin EC  81 mg Oral Daily  . atorvastatin  40 mg Oral q1800  . carvedilol  25 mg Oral BID WC  . heparin  5,000 Units Subcutaneous Q8H  . isosorbide-hydrALAZINE  1 tablet Oral TID  . mometasone-formoterol  2 puff Inhalation BID  . montelukast  10 mg Oral QHS  . nicotine  21 mg Transdermal Daily  . pneumococcal 23 valent vaccine  0.5 mL Intramuscular Tomorrow-1000  . sodium chloride flush  3 mL Intravenous Once  . sodium chloride flush  3 mL Intravenous Q12H   Continuous Infusions: . sodium chloride    . nitroGLYCERIN Stopped (10/27/19 1139)   PRN Meds: sodium chloride, acetaminophen, albuterol, ALPRAZolam, benzonatate, fluticasone, ondansetron (ZOFRAN) IV, sodium chloride flush, traZODone   Vital Signs    Vitals:   10/28/19 0600 10/28/19 0624 10/28/19 0635 10/28/19 0850  BP:      Pulse: 84 85 79   Resp: (!) 37 (!) 28 (!) 23   Temp:      TempSrc:      SpO2: 94% 92% 96% 100%  Weight:      Height:        Intake/Output Summary (Last 24 hours) at 10/28/2019 1039 Last data filed at 10/28/2019 0946 Gross per 24 hour  Intake 622.23 ml  Output 3200 ml  Net -2577.77 ml   Last 3 Weights 10/26/2019 10/26/2019 11/30/2018  Weight (lbs) 287 lb 4.8 oz 270 lb 250 lb 9.6 oz  Weight (kg) 130.318 kg 122.471 kg 113.671 kg  Some encounter information is confidential and restricted. Go to Review Flowsheets activity to see all data.      Telemetry    Sinus rhythm, rate 70-80s - Personally Reviewed  ECG    No new - Personally Reviewed  Physical Exam   GEN: No acute distress.   Neck: No JVD appreciated but difficult to assess given body  habitus Cardiac: RRR, no murmurs Respiratory:  Diffuse rhonchi GI: Soft, nontender MS:  No edema Neuro:  Nonfocal  Psych: Normal affect   Labs    High Sensitivity Troponin:   Recent Labs  Lab 10/26/19 1600 10/26/19 1804  TROPONINIHS 124* 133*      Chemistry Recent Labs  Lab 10/26/19 1600 10/27/19 0357 10/28/19 0404  NA 139 139 139  K 5.3* 4.6 4.9  CL 104 101 97*  CO2 26 26 29   GLUCOSE 100* 106* 105*  BUN 30* 32* 33*  CREATININE 1.92* 1.87* 2.27*  CALCIUM 8.9 8.7* 8.7*  PROT 5.8* 5.9*  --   ALBUMIN 2.8* 2.8*  --   AST 41 34  --   ALT 56* 56*  --   ALKPHOS 64 64  --   BILITOT 0.6 0.6  --   GFRNONAA 38* 39* 31*  GFRAA 44* 45* 36*  ANIONGAP 9 12 13      Hematology Recent Labs  Lab 10/26/19 1525 10/26/19 1804 10/27/19 0357  WBC 11.9* 11.8* 10.0  RBC 5.02 5.18 4.91  HGB 14.9 15.7 14.6  HCT 47.4 49.4 46.6  MCV 94.4 95.4 94.9  MCH 29.7 30.3 29.7  MCHC 31.4 31.8 31.3  RDW 15.3 15.3 15.1  PLT 240 252 223    BNP Recent Labs  Lab 10/26/19 1527  BNP 1,823.3*     DDimer No results for input(s): DDIMER in the last 168 hours.   Radiology    DG Chest 2 View  Result Date: 10/26/2019 CLINICAL DATA:  Shortness of breath EXAM: CHEST - 2 VIEW COMPARISON:  Chest radiograph and chest CT August 10, 2018 FINDINGS: There is no appreciable edema or airspace opacity. There is cardiomegaly with pulmonary vascularity normal. No adenopathy. There is aortic atherosclerosis. No appreciable bone lesions. IMPRESSION: Cardiomegaly. No edema or airspace opacity. No evident adenopathy. Aortic Atherosclerosis (ICD10-I70.0). Electronically Signed   By: Lowella Grip III M.D.   On: 10/26/2019 16:17   DG Chest Port 1 View  Result Date: 10/27/2019 CLINICAL DATA:  Shortness of breath EXAM: PORTABLE CHEST 1 VIEW COMPARISON:  10/26/2019 FINDINGS: Moderate cardiomegaly is unchanged. There is pulmonary vascular congestion without overt edema. No pleural effusion or pneumothorax.  IMPRESSION: Cardiomegaly and pulmonary vascular congestion without overt edema. Electronically Signed   By: Ulyses Jarred M.D.   On: 10/27/2019 03:56   ECHOCARDIOGRAM COMPLETE  Result Date: 10/27/2019    ECHOCARDIOGRAM REPORT   Patient Name:   Chris [name] Date of Exam: 10/27/2019 Medical Rec #:  [id number]       Height:       72.0 in Accession #:    [id number]      Weight:       287.3 lb Date of Birth:  1960/08/30       BSA:          2.485 m Patient Age:    79 years        BP:           151/103 mmHg Patient Gender: M               HR:           79 bpm. Exam Location:  Inpatient Procedure: 2D Echo, Cardiac Doppler and Color Doppler Indications:    CHF-Acute Systolic [id number] / [id number]  History:        Patient has prior history of Echocardiogram examinations, most                 recent 10/21/2013. CHF, COPD; Risk Factors:Hypertension. CKD                 (chronic kidney disease) stage 3, GFR 30-59 ml/min,Cocaine                 abuse,Tobacco dependence,Obesity.  Sonographer:    Alvino Chapel RCS Referring Phys: [phone] [place]  1. Left ventricular ejection fraction, by estimation, is 30 to 35%. The left ventricle has moderately decreased function. The left ventricle demonstrates regional wall motion abnormalities (see scoring diagram/findings for description). Diffuse hypokinesis with basal inferior akinesis. There is moderate left ventricular hypertrophy. Left ventricular diastolic parameters are consistent with Grade II diastolic dysfunction (pseudonormalization).  2. Right ventricular systolic function is low normal. The right ventricular size is normal. There is normal pulmonary artery systolic pressure. The estimated right ventricular systolic pressure is 0000000 mmHg.  3. Left atrial size was moderately dilated.  4. Right atrial size was mildly dilated.  5. The mitral valve is grossly normal. Mild mitral valve regurgitation.  6. The aortic valve is tricuspid. Aortic valve regurgitation is  not visualized.  7. The inferior vena cava is dilated in size with >50% respiratory variability,  suggesting right atrial pressure of 8 mmHg. FINDINGS  Left Ventricle: Left ventricular ejection fraction, by estimation, is 30 to 35%. The left ventricle has moderately decreased function. The left ventricle demonstrates regional wall motion abnormalities. The left ventricular internal cavity size was normal in size. There is moderate left ventricular hypertrophy. Left ventricular diastolic parameters are consistent with Grade II diastolic dysfunction (pseudonormalization).  LV Wall Scoring: The basal inferior segment is akinetic. Right Ventricle: The right ventricular size is normal. No increase in right ventricular wall thickness. Right ventricular systolic function is low normal. There is normal pulmonary artery systolic pressure. The tricuspid regurgitant velocity is 2.10 m/s,  and with an assumed right atrial pressure of 8 mmHg, the estimated right ventricular systolic pressure is 0000000 mmHg. Left Atrium: Left atrial size was moderately dilated. Right Atrium: Right atrial size was mildly dilated. Pericardium: There is no evidence of pericardial effusion. Mitral Valve: The mitral valve is grossly normal. Mild mitral valve regurgitation. Tricuspid Valve: The tricuspid valve is grossly normal. Tricuspid valve regurgitation is mild. Aortic Valve: The aortic valve is tricuspid. Aortic valve regurgitation is not visualized. Mild aortic valve annular calcification. Pulmonic Valve: The pulmonic valve was not well visualized. Pulmonic valve regurgitation is trivial. Aorta: The aortic root is normal in size and structure. Venous: The inferior vena cava is dilated in size with greater than 50% respiratory variability, suggesting right atrial pressure of 8 mmHg. IAS/Shunts: No atrial level shunt detected by color flow Doppler.  LEFT VENTRICLE PLAX 2D LVIDd:         6.70 cm      Diastology LVIDs:         5.50 cm      LV e'  lateral:   6.24 cm/s LV PW:         1.40 cm      LV E/e' lateral: 11.0 LV IVS:        1.70 cm      LV e' medial:    4.22 cm/s LVOT diam:     2.20 cm      LV E/e' medial:  16.3 LV SV:         52 LV SV Index:   21 LVOT Area:     3.80 cm  LV Volumes (MOD) LV vol d, MOD A2C: 179.0 ml LV vol d, MOD A4C: 191.0 ml LV vol s, MOD A2C: 120.0 ml LV vol s, MOD A4C: 118.0 ml LV SV MOD A2C:     59.0 ml LV SV MOD A4C:     191.0 ml LV SV MOD BP:      74.6 ml RIGHT VENTRICLE RV S prime:     13.60 cm/s TAPSE (M-mode): 2.4 cm LEFT ATRIUM            Index       RIGHT ATRIUM           Index LA diam:      3.80 cm  1.53 cm/m  RA Area:     25.90 cm LA Vol (A2C): 96.7 ml  38.91 ml/m RA Volume:   98.50 ml  39.64 ml/m LA Vol (A4C): 111.0 ml 44.67 ml/m  AORTIC VALVE LVOT Vmax:   74.70 cm/s LVOT Vmean:  52.000 cm/s LVOT VTI:    0.137 m  AORTA Ao Root diam: 3.40 cm MITRAL VALVE               TRICUSPID VALVE MV Area (PHT): 4.35 cm    TR Peak grad:  17.6 mmHg MV Decel Time: 175 msec    TR Vmax:        210.00 cm/s MV E velocity: 68.90 cm/s MV A velocity: 53.10 cm/s  SHUNTS MV E/A ratio:  1.30        Systemic VTI:  0.14 m                            Systemic Diam: 2.20 cm Rozann Lesches MD Electronically signed by Rozann Lesches MD Signature Date/Time: 10/27/2019/12:46:58 PM    Final     Cardiac Studies   TTE 10/27/19: 1. Left ventricular ejection fraction, by estimation, is 30 to 35%. The  left ventricle has moderately decreased function. The left ventricle  demonstrates regional wall motion abnormalities (see scoring  diagram/findings for description). Diffuse  hypokinesis with basal inferior akinesis. There is moderate left  ventricular hypertrophy. Left ventricular diastolic parameters are  consistent with Grade II diastolic dysfunction (pseudonormalization).  2. Right ventricular systolic function is low normal. The right  ventricular size is normal. There is normal pulmonary artery systolic  pressure. The estimated  right ventricular systolic pressure is 0000000 mmHg.  3. Left atrial size was moderately dilated.  4. Right atrial size was mildly dilated.  5. The mitral valve is grossly normal. Mild mitral valve regurgitation.  6. The aortic valve is tricuspid. Aortic valve regurgitation is not  visualized.  7. The inferior vena cava is dilated in size with >50% respiratory  variability, suggesting right atrial pressure of 8 mmHg.   Patient Profile     59 y.o. male with a hx of nonischemic cardiomyopathy (EF was 20-25% but most recently documented at [facility] 40%, normal left heart cath in 2015 [facility]), CKD, cocaine abuse, tobacco abuse, hypertension   Assessment & Plan    Hypertensive Crisis with Volume overload: He was severely hypertensive with diastolic pressures in the 120s on admission.  Chest x-ray with pulmonary edema.  BNP elevated to 1823.  TTE shows EF 30-35%.  Initially on NTG gtt, has been weaned off. -Bump in Cr from 1.9 ->2.3.  Net negative 3L yesterday.  Will hold IV lasix today and switch to PO lasix tomorrow. -Continue carvedilol 25 mg BID -Holding ACE/ARB/Arni given AKI on CKD -Switch from hydralazine to Bidil 1 tab TID, can titrate as needed  Chest pain/Demand ischemia/elevated troponin: hsTn 133.  Troponin elevated in the setting of hypertensive crisis and volume overload.  He has atypical right-sided chest pain.  This is likely related to his volume overload and shortness of breath.  He did have a left heart catheterization in 2015 per records that was normal.  - Continue ASA, statin  Nonischemic cardiomyopathy: Longstanding history of nonischemic cardiomyopathy in the past.  Possibly related to hypertension and cocaine abuse.  He has had issues with noncompliance.  For now we will continue with medical therapy optimization as above. - Continue coreg, start Bidil as above   For questions or updates, please contact [place] Please consult [email or web address] for contact info under         Signed, Donato Heinz, MD  10/28/2019, 10:39 AM

## 2019-10-28 NOTE — Progress Notes (Signed)
PROGRESS NOTE    Chris [name]  [id number] DOB: August 01, 1960 DOA: 10/26/2019 PCP: [name], MD   Brief Narrative:  Per admitting MD: Chris Waldner [name] a 59 y.o.malewith medical history significant ofPolysubstance abuse including cocaine and cannabis, chronic back pain,who presents complaining of shortness of breath and abdominal pain for several days. He reports worsening shortness of breath over the past month. States that he took cocaine last night to help with the abdominal pain. As of breath is worse when he exerts himself as well as when he lies flat sleeping. Symptoms are most notable at night which awaken him from sleep. He has felt like he has had increased fluid retention in his abdomen and his legs. He has had some wheezing but that has not been adequately controlled with his home inhaler. He has abdominal tightness from the swelling and admits to using cocaine last night to help with the abdominal pain. He also complains of pain in the right lower rib cage it is intermittent and is not associated with breathing and it is moderate in nature. Patient has had his Covid vaccination. He denies any fevers, chills, nausea, vomiting, cough, sputum production, runny nose, sneezing, loss of taste smell or rash. Currently not taking any diuretic. Saw his primary care provider on May 5 and was prescribed Z-Pak and some steroids for an upper respiratory infection. He has not been seen in person in quite some time.  ED Course:Patient in moderate respiratory discomfort on examination with fluid overload and lower extremity edema. BNP is 1823. Dust x-ray without edema or airspace opacity. IV Lasix was given as well as a nitro drip. Potassium was slightly elevated at pot 5.3 Lokelma was given. Patient with elevated troponin of 123. EKG shows sinus rhythm with biatrial enlargement and abnormal T waves. Troponin elevation likely reflects manned ischemia. Patient referred  to [facility] for [facility]   Assessment & Plan:   Active Problems:   Cocaine abuse (HCC)   Essential hypertension   CKD (chronic kidney disease) stage 3, GFR 30-59 ml/min   COPD (chronic obstructive pulmonary disease) (HCC)   Tobacco dependence   Acute on chronic combined systolic and diastolic CHF (congestive heart failure) (HCC)   Elevated troponin I level   Acute on chronic diastolic (congestive) heart failure ([place])   1. Acute on chronic combined systolic and diastolic congestive heart failure:  -Diuresing with lasix 80 bid-held today 2/2 bump in creatinine, likely PO tomorrow -avoid ace/arb given ckd -TTE=shows ef 30-35%, pos WMA, Pulm HTN -Cards following  2. Elevated troponin level:  -AGREE, Likely related to strain from cocaine use and heart failure.  -CARDS SAW IN CONSULTATION-DEMAND ISCHEMIA  3. Cocaine abuse:  -counseled abstinence  4. Essential hypertension:  -weaned nitro gtt -hydralazine d/c'd -coreg 25 bid -added BiDil  5. Chronic kidney disease stage III:  -monitor closely-cr bumped to 2.3-holding diuresis today -avoid renal toxic meds -no ace/arb for now.  6. COPD:  -c/w home meds  7. Tobacco dependence: Continue nicotine patch. Not sure if patient has been using it at home.  8. Disposition: Likely home  DVT prophylaxis: Heparin SQ  Code Status: full    Code Status Orders  (From admission, onward)         Start     Ordered   10/26/19 1850  Full code  Continuous     10/26/19 1854        Code Status History    Date Active Date Inactive Code Status Order  ID Comments User Context   03/19/2014 0139 03/20/2014 1725 Full Code [id number]  Alvina Chou, PA-C ED   02/18/2013 1728 02/21/2013 1845 Full Code [id number]  Waylan Boga, NP Inpatient   02/18/2013 0438 02/18/2013 1728 Full Code [id number]  Pamella Pert, MD ED   Advance Care Planning Activity     Family Communication: none present  Disposition Plan:    Status  is: Inpatient  Remains inpatient appropriate because:IV treatments appropriate due to intensity of illness or inability to take PO   Dispo: The patient is from: Home  Anticipated d/c is to: Home  Anticipated d/c date is: 1 days  Patient currently is not medically stable to d/c Consults called: cards Admission status: Inpatient   Consultants:   as above  Procedures:  DG Chest 2 View  Result Date: 10/26/2019 CLINICAL DATA:  Shortness of breath EXAM: CHEST - 2 VIEW COMPARISON:  Chest radiograph and chest CT August 10, 2018 FINDINGS: There is no appreciable edema or airspace opacity. There is cardiomegaly with pulmonary vascularity normal. No adenopathy. There is aortic atherosclerosis. No appreciable bone lesions. IMPRESSION: Cardiomegaly. No edema or airspace opacity. No evident adenopathy. Aortic Atherosclerosis (ICD10-I70.0). Electronically Signed   By: Lowella Grip III M.D.   On: 10/26/2019 16:17   DG Chest Port 1 View  Result Date: 10/27/2019 CLINICAL DATA:  Shortness of breath EXAM: PORTABLE CHEST 1 VIEW COMPARISON:  10/26/2019 FINDINGS: Moderate cardiomegaly is unchanged. There is pulmonary vascular congestion without overt edema. No pleural effusion or pneumothorax. IMPRESSION: Cardiomegaly and pulmonary vascular congestion without overt edema. Electronically Signed   By: Ulyses Jarred M.D.   On: 10/27/2019 03:56   ECHOCARDIOGRAM COMPLETE  Result Date: 10/27/2019    ECHOCARDIOGRAM REPORT   Patient Name:   Chris [name] Date of Exam: 10/27/2019 Medical Rec #:  [id number]       Height:       72.0 in Accession #:    [id number]      Weight:       287.3 lb Date of Birth:  [birth year]       BSA:          2.485 m Patient Age:    25 years        BP:           151/103 mmHg Patient Gender: M               HR:           79 bpm. Exam Location:  Inpatient Procedure: 2D Echo, Cardiac Doppler and Color Doppler Indications:    CHF-Acute Systolic [id number] /  [id number]  History:        Patient has prior history of Echocardiogram examinations, most                 recent 10/21/2013. CHF, COPD; Risk Factors:Hypertension. CKD                 (chronic kidney disease) stage 3, GFR 30-59 ml/min,Cocaine                 abuse,Tobacco dependence,Obesity.  Sonographer:    Alvino Chapel RCS Referring Phys: [phone] [place]  1. Left ventricular ejection fraction, by estimation, is 30 to 35%. The left ventricle has moderately decreased function. The left ventricle demonstrates regional wall motion abnormalities (see scoring diagram/findings for description). Diffuse hypokinesis with basal inferior akinesis. There is moderate left ventricular hypertrophy. Left ventricular diastolic parameters are consistent with Grade II  diastolic dysfunction (pseudonormalization).  2. Right ventricular systolic function is low normal. The right ventricular size is normal. There is normal pulmonary artery systolic pressure. The estimated right ventricular systolic pressure is 0000000 mmHg.  3. Left atrial size was moderately dilated.  4. Right atrial size was mildly dilated.  5. The mitral valve is grossly normal. Mild mitral valve regurgitation.  6. The aortic valve is tricuspid. Aortic valve regurgitation is not visualized.  7. The inferior vena cava is dilated in size with >50% respiratory variability, suggesting right atrial pressure of 8 mmHg. FINDINGS  Left Ventricle: Left ventricular ejection fraction, by estimation, is 30 to 35%. The left ventricle has moderately decreased function. The left ventricle demonstrates regional wall motion abnormalities. The left ventricular internal cavity size was normal in size. There is moderate left ventricular hypertrophy. Left ventricular diastolic parameters are consistent with Grade II diastolic dysfunction (pseudonormalization).  LV Wall Scoring: The basal inferior segment is akinetic. Right Ventricle: The right ventricular size is normal. No  increase in right ventricular wall thickness. Right ventricular systolic function is low normal. There is normal pulmonary artery systolic pressure. The tricuspid regurgitant velocity is 2.10 m/s,  and with an assumed right atrial pressure of 8 mmHg, the estimated right ventricular systolic pressure is 0000000 mmHg. Left Atrium: Left atrial size was moderately dilated. Right Atrium: Right atrial size was mildly dilated. Pericardium: There is no evidence of pericardial effusion. Mitral Valve: The mitral valve is grossly normal. Mild mitral valve regurgitation. Tricuspid Valve: The tricuspid valve is grossly normal. Tricuspid valve regurgitation is mild. Aortic Valve: The aortic valve is tricuspid. Aortic valve regurgitation is not visualized. Mild aortic valve annular calcification. Pulmonic Valve: The pulmonic valve was not well visualized. Pulmonic valve regurgitation is trivial. Aorta: The aortic root is normal in size and structure. Venous: The inferior vena cava is dilated in size with greater than 50% respiratory variability, suggesting right atrial pressure of 8 mmHg. IAS/Shunts: No atrial level shunt detected by color flow Doppler.  LEFT VENTRICLE PLAX 2D LVIDd:         6.70 cm      Diastology LVIDs:         5.50 cm      LV e' lateral:   6.24 cm/s LV PW:         1.40 cm      LV E/e' lateral: 11.0 LV IVS:        1.70 cm      LV e' medial:    4.22 cm/s LVOT diam:     2.20 cm      LV E/e' medial:  16.3 LV SV:         52 LV SV Index:   21 LVOT Area:     3.80 cm  LV Volumes (MOD) LV vol d, MOD A2C: 179.0 ml LV vol d, MOD A4C: 191.0 ml LV vol s, MOD A2C: 120.0 ml LV vol s, MOD A4C: 118.0 ml LV SV MOD A2C:     59.0 ml LV SV MOD A4C:     191.0 ml LV SV MOD BP:      74.6 ml RIGHT VENTRICLE RV S prime:     13.60 cm/s TAPSE (M-mode): 2.4 cm LEFT ATRIUM            Index       RIGHT ATRIUM           Index LA diam:      3.80 cm  1.53 cm/m  RA Area:     25.90 cm LA Vol (A2C): 96.7 ml  38.91 ml/m RA Volume:   98.50 ml   39.64 ml/m LA Vol (A4C): 111.0 ml 44.67 ml/m  AORTIC VALVE LVOT Vmax:   74.70 cm/s LVOT Vmean:  52.000 cm/s LVOT VTI:    0.137 m  AORTA Ao Root diam: 3.40 cm MITRAL VALVE               TRICUSPID VALVE MV Area (PHT): 4.35 cm    TR Peak grad:   17.6 mmHg MV Decel Time: 175 msec    TR Vmax:        210.00 cm/s MV E velocity: 68.90 cm/s MV A velocity: 53.10 cm/s  SHUNTS MV E/A ratio:  1.30        Systemic VTI:  0.14 m                            Systemic Diam: 2.20 cm Rozann Lesches MD Electronically signed by Rozann Lesches MD Signature Date/Time: 10/27/2019/12:46:58 PM    Final      Antimicrobials:   none    Subjective: Still reports significant edema and sob with exertion  Objective: Vitals:   10/28/19 0600 10/28/19 0624 10/28/19 0635 10/28/19 0850  BP:      Pulse: 84 85 79   Resp: (!) 37 (!) 28 (!) 23   Temp:      TempSrc:      SpO2: 94% 92% 96% 100%  Weight:      Height:        Intake/Output Summary (Last 24 hours) at 10/28/2019 1146 Last data filed at 10/28/2019 0946 Gross per 24 hour  Intake 1042.23 ml  Output 3600 ml  Net -2557.77 ml   Filed Weights   10/26/19 1518 10/26/19 2200  Weight: 122.5 kg 130.3 kg    Examination:  General exam: Appears calm and comfortable  Respiratory system: Clear to auscultation. Respiratory effort normal. Cardiovascular system: S1 & S2 heard, RRR. No JVD, murmurs, rubs, gallops or clicks. No pedal edema. Gastrointestinal system: Abdomen is nondistended, soft and nontender. No organomegaly or masses felt. Normal bowel sounds heard. Central nervous system: Alert and oriented. No focal neurological deficits. Extremities: 2+ pitting edema bilat LE Skin: No rashes, lesions or ulcers Psychiatry: Judgement and insight ARE POOR, Mood & affect appropriate     Data Reviewed: I have personally reviewed following labs and imaging studies  CBC: Recent Labs  Lab 10/26/19 1525 10/26/19 1804 10/27/19 0357  WBC 11.9* 11.8* 10.0  NEUTROABS  --    --  6.9  HGB 14.9 15.7 14.6  HCT 47.4 49.4 46.6  MCV 94.4 95.4 94.9  PLT 240 252 [id number]   Basic Metabolic Panel: Recent Labs  Lab 10/26/19 1600 10/27/19 0357 10/28/19 0404  NA 139 139 139  K 5.3* 4.6 4.9  CL 104 101 97*  CO2 26 26 29   GLUCOSE 100* 106* 105*  BUN 30* 32* 33*  CREATININE 1.92* 1.87* 2.27*  CALCIUM 8.9 8.7* 8.7*   GFR: Estimated Creatinine Clearance: 50.1 mL/min (A) (by C-G formula based on SCr of 2.27 mg/dL (H)). Liver Function Tests: Recent Labs  Lab 10/26/19 1600 10/27/19 0357  AST 41 34  ALT 56* 56*  ALKPHOS 64 64  BILITOT 0.6 0.6  PROT 5.8* 5.9*  ALBUMIN 2.8* 2.8*   Recent Labs  Lab 10/26/19 1600  LIPASE 29   No results for input(s): AMMONIA in  the last 168 hours. Coagulation Profile: No results for input(s): INR, PROTIME in the last 168 hours. Cardiac Enzymes: No results for input(s): CKTOTAL, CKMB, CKMBINDEX, TROPONINI in the last 168 hours. BNP (last 3 results) No results for input(s): PROBNP in the last 8760 hours. HbA1C: No results for input(s): HGBA1C in the last 72 hours. CBG: Recent Labs  Lab 10/27/19 0341  GLUCAP 109*   Lipid Profile: No results for input(s): CHOL, HDL, LDLCALC, TRIG, CHOLHDL, LDLDIRECT in the last 72 hours. Thyroid Function Tests: Recent Labs    10/26/19 2139  TSH 1.592   Anemia Panel: No results for input(s): VITAMINB12, FOLATE, FERRITIN, TIBC, IRON, RETICCTPCT in the last 72 hours. Sepsis Labs: No results for input(s): PROCALCITON, LATICACIDVEN in the last 168 hours.  Recent Results (from the past 240 hour(s))  SARS Coronavirus 2 by RT PCR (hospital order, performed in [facility] hospital lab) Nasopharyngeal Nasopharyngeal Swab     Status: None   Collection Time: 10/26/19  6:29 PM   Specimen: Nasopharyngeal Swab  Result Value Ref Range Status   SARS Coronavirus 2 NEGATIVE NEGATIVE Final    Comment: (NOTE) SARS-CoV-2 target nucleic acids are NOT DETECTED. The SARS-CoV-2 RNA is generally  detectable in upper and lower respiratory specimens during the acute phase of infection. The lowest concentration of SARS-CoV-2 viral copies this assay can detect is 250 copies / mL. A negative result does not preclude SARS-CoV-2 infection and should not be used as the sole basis for treatment or other patient management decisions.  A negative result may occur with improper specimen collection / handling, submission of specimen other than nasopharyngeal swab, presence of viral mutation(s) within the areas targeted by this assay, and inadequate number of viral copies (<250 copies / mL). A negative result must be combined with clinical observations, patient history, and epidemiological information. Fact Sheet for Patients:   StrictlyIdeas.no Fact Sheet for Healthcare Providers: BankingDealers.co.za This test is not yet approved or cleared  by the [place] FDA and has been authorized for detection and/or diagnosis of SARS-CoV-2 by FDA under an Emergency Use Authorization (EUA).  This EUA will remain in effect (meaning this test can be used) for the duration of the COVID-19 declaration under Section 564(b)(1) of the Act, 21 U.S.C. section 360bbb-3(b)(1), unless the authorization is terminated or revoked sooner. Performed at [facility], [place] [address], [place], [place] [address]          Radiology Studies: DG Chest 2 View  Result Date: 10/26/2019 CLINICAL DATA:  Shortness of breath EXAM: CHEST - 2 VIEW COMPARISON:  Chest radiograph and chest CT August 10, 2018 FINDINGS: There is no appreciable edema or airspace opacity. There is cardiomegaly with pulmonary vascularity normal. No adenopathy. There is aortic atherosclerosis. No appreciable bone lesions. IMPRESSION: Cardiomegaly. No edema or airspace opacity. No evident adenopathy. Aortic Atherosclerosis (ICD10-I70.0). Electronically Signed   By: Lowella Grip III M.D.   On:  10/26/2019 16:17   DG Chest Port 1 View  Result Date: 10/27/2019 CLINICAL DATA:  Shortness of breath EXAM: PORTABLE CHEST 1 VIEW COMPARISON:  10/26/2019 FINDINGS: Moderate cardiomegaly is unchanged. There is pulmonary vascular congestion without overt edema. No pleural effusion or pneumothorax. IMPRESSION: Cardiomegaly and pulmonary vascular congestion without overt edema. Electronically Signed   By: Ulyses Jarred M.D.   On: 10/27/2019 03:56   ECHOCARDIOGRAM COMPLETE  Result Date: 10/27/2019    ECHOCARDIOGRAM REPORT   Patient Name:   Chris [name] Date of Exam: 10/27/2019 Medical Rec #:  [id number]       Height:       72.0 in Accession #:    [id number]      Weight:       287.3 lb Date of Birth:  [birth year]       BSA:          2.485 m Patient Age:    43 years        BP:           151/103 mmHg Patient Gender: M               HR:           79 bpm. Exam Location:  Inpatient Procedure: 2D Echo, Cardiac Doppler and Color Doppler Indications:    CHF-Acute Systolic [id number] / [id number]  History:        Patient has prior history of Echocardiogram examinations, most                 recent 10/21/2013. CHF, COPD; Risk Factors:Hypertension. CKD                 (chronic kidney disease) stage 3, GFR 30-59 ml/min,Cocaine                 abuse,Tobacco dependence,Obesity.  Sonographer:    Alvino Chapel RCS Referring Phys: [phone] [place]  1. Left ventricular ejection fraction, by estimation, is 30 to 35%. The left ventricle has moderately decreased function. The left ventricle demonstrates regional wall motion abnormalities (see scoring diagram/findings for description). Diffuse hypokinesis with basal inferior akinesis. There is moderate left ventricular hypertrophy. Left ventricular diastolic parameters are consistent with Grade II diastolic dysfunction (pseudonormalization).  2. Right ventricular systolic function is low normal. The right ventricular size is normal. There is normal pulmonary artery systolic  pressure. The estimated right ventricular systolic pressure is 0000000 mmHg.  3. Left atrial size was moderately dilated.  4. Right atrial size was mildly dilated.  5. The mitral valve is grossly normal. Mild mitral valve regurgitation.  6. The aortic valve is tricuspid. Aortic valve regurgitation is not visualized.  7. The inferior vena cava is dilated in size with >50% respiratory variability, suggesting right atrial pressure of 8 mmHg. FINDINGS  Left Ventricle: Left ventricular ejection fraction, by estimation, is 30 to 35%. The left ventricle has moderately decreased function. The left ventricle demonstrates regional wall motion abnormalities. The left ventricular internal cavity size was normal in size. There is moderate left ventricular hypertrophy. Left ventricular diastolic parameters are consistent with Grade II diastolic dysfunction (pseudonormalization).  LV Wall Scoring: The basal inferior segment is akinetic. Right Ventricle: The right ventricular size is normal. No increase in right ventricular wall thickness. Right ventricular systolic function is low normal. There is normal pulmonary artery systolic pressure. The tricuspid regurgitant velocity is 2.10 m/s,  and with an assumed right atrial pressure of 8 mmHg, the estimated right ventricular systolic pressure is 0000000 mmHg. Left Atrium: Left atrial size was moderately dilated. Right Atrium: Right atrial size was mildly dilated. Pericardium: There is no evidence of pericardial effusion. Mitral Valve: The mitral valve is grossly normal. Mild mitral valve regurgitation. Tricuspid Valve: The tricuspid valve is grossly normal. Tricuspid valve regurgitation is mild. Aortic Valve: The aortic valve is tricuspid. Aortic valve regurgitation is not visualized. Mild aortic valve annular calcification. Pulmonic Valve: The pulmonic valve was not well visualized. Pulmonic valve regurgitation is trivial. Aorta: The aortic root is normal in size and structure. Venous: The  inferior vena cava is dilated in size with greater than 50% respiratory variability, suggesting right atrial pressure of 8 mmHg. IAS/Shunts: No atrial level shunt detected by color flow Doppler.  LEFT VENTRICLE PLAX 2D LVIDd:         6.70 cm      Diastology LVIDs:         5.50 cm      LV e' lateral:   6.24 cm/s LV PW:         1.40 cm      LV E/e' lateral: 11.0 LV IVS:        1.70 cm      LV e' medial:    4.22 cm/s LVOT diam:     2.20 cm      LV E/e' medial:  16.3 LV SV:         52 LV SV Index:   21 LVOT Area:     3.80 cm  LV Volumes (MOD) LV vol d, MOD A2C: 179.0 ml LV vol d, MOD A4C: 191.0 ml LV vol s, MOD A2C: 120.0 ml LV vol s, MOD A4C: 118.0 ml LV SV MOD A2C:     59.0 ml LV SV MOD A4C:     191.0 ml LV SV MOD BP:      74.6 ml RIGHT VENTRICLE RV S prime:     13.60 cm/s TAPSE (M-mode): 2.4 cm LEFT ATRIUM            Index       RIGHT ATRIUM           Index LA diam:      3.80 cm  1.53 cm/m  RA Area:     25.90 cm LA Vol (A2C): 96.7 ml  38.91 ml/m RA Volume:   98.50 ml  39.64 ml/m LA Vol (A4C): 111.0 ml 44.67 ml/m  AORTIC VALVE LVOT Vmax:   74.70 cm/s LVOT Vmean:  52.000 cm/s LVOT VTI:    0.137 m  AORTA Ao Root diam: 3.40 cm MITRAL VALVE               TRICUSPID VALVE MV Area (PHT): 4.35 cm    TR Peak grad:   17.6 mmHg MV Decel Time: 175 msec    TR Vmax:        210.00 cm/s MV E velocity: 68.90 cm/s MV A velocity: 53.10 cm/s  SHUNTS MV E/A ratio:  1.30        Systemic VTI:  0.14 m                            Systemic Diam: 2.20 cm Rozann Lesches MD Electronically signed by Rozann Lesches MD Signature Date/Time: 10/27/2019/12:46:58 PM    Final         Scheduled Meds: . aspirin EC  81 mg Oral Daily  . atorvastatin  40 mg Oral q1800  . carvedilol  25 mg Oral BID WC  . heparin  5,000 Units Subcutaneous Q8H  . isosorbide-hydrALAZINE  1 tablet Oral TID  . mometasone-formoterol  2 puff Inhalation BID  . montelukast  10 mg Oral QHS  . nicotine  21 mg Transdermal Daily  . pneumococcal 23 valent vaccine  0.5  mL Intramuscular Tomorrow-1000  . sodium chloride flush  3 mL Intravenous Once  . sodium chloride flush  3 mL Intravenous Q12H   Continuous Infusions: . sodium chloride    . nitroGLYCERIN Stopped (10/27/19 1139)  LOS: 2 days    Time spent: 35  min    Nicolette Bang, MD [facility]  If 7PM-7AM, please contact night-coverage  10/28/2019, 11:46 AM

## 2019-10-29 DIAGNOSIS — F1410 Cocaine abuse, uncomplicated: Secondary | ICD-10-CM

## 2019-10-29 DIAGNOSIS — F17200 Nicotine dependence, unspecified, uncomplicated: Secondary | ICD-10-CM

## 2019-10-29 LAB — BASIC METABOLIC PANEL
Anion gap: 7 (ref 5–15)
BUN: 35 mg/dL — ABNORMAL HIGH (ref 6–20)
CO2: 29 mmol/L (ref 22–32)
Calcium: 8.4 mg/dL — ABNORMAL LOW (ref 8.9–10.3)
Chloride: 104 mmol/L (ref 98–111)
Creatinine, Ser: 2.2 mg/dL — ABNORMAL HIGH (ref 0.61–1.24)
GFR calc Af Amer: 37 mL/min — ABNORMAL LOW (ref >60)
GFR calc non Af Amer: 32 mL/min — ABNORMAL LOW (ref >60)
Glucose, Bld: 92 mg/dL (ref 70–99)
Potassium: 4.8 mmol/L (ref 3.5–5.1)
Sodium: 140 mmol/L (ref 135–145)

## 2019-10-29 MED ORDER — ISOSORB DINITRATE-HYDRALAZINE 20-37.5 MG PO TABS: 1.0000 | ORAL_TABLET | Freq: Three times a day (TID) | ORAL | 0 refills | Status: DC

## 2019-10-29 MED ORDER — FUROSEMIDE 40 MG PO TABS
40.0000 mg | ORAL_TABLET | Freq: Every day | ORAL | Status: DC
  Administered 2019-10-29: 40 mg via ORAL
  Filled 2019-10-29: qty 1

## 2019-10-29 MED ORDER — FUROSEMIDE 40 MG PO TABS: 40.0000 mg | ORAL_TABLET | Freq: Every day | ORAL | 0 refills | Status: DC

## 2019-10-29 MED ORDER — ASPIRIN 81 MG PO TBEC: 81.0000 mg | DELAYED_RELEASE_TABLET | Freq: Every day | ORAL | 0 refills | Status: DC

## 2019-10-29 MED FILL — BIDIL 20-37.5 MG TABS: 20-37.5 | 30 days supply | Qty: 90 | Fill #0

## 2019-10-29 MED FILL — FUROSEMIDE 40 MG TAB: 40 | 30 days supply | Qty: 30 | Fill #0

## 2019-10-29 NOTE — Discharge Instructions (Signed)
Heart Failure, Diagnosis  Heart failure means that your heart is not able to pump blood in the right way. This makes it hard for your body to work well. Heart failure is usually a long-term (chronic) condition. You must take good care of yourself and follow your treatment plan from your doctor. What are the causes? This condition may be caused by:  High blood pressure.  Build up of cholesterol and fat in the arteries.  Heart attack. This injures the heart muscle.  Heart valves that do not open and close properly.  Damage of the heart muscle. This is also called cardiomyopathy.  Lung disease.  Abnormal heart rhythms. What increases the risk? The risk of heart failure goes up as a person ages. This condition is also more likely to develop in people who:  Are overweight.  Are male.  Smoke or chew tobacco.  Abuse alcohol or illegal drugs.  Have taken medicines that can damage the heart.  Have diabetes.  Have abnormal heart rhythms.  Have thyroid problems.  Have low blood counts (anemia). What are the signs or symptoms? Symptoms of this condition include:  Shortness of breath.  Coughing.  Swelling of the feet, ankles, legs, or belly.  Losing weight for no reason.  Trouble breathing.  Waking from sleep because of the need to sit up and get more air.  Rapid heartbeat.  Being very tired.  Feeling dizzy, or feeling like you may pass out (faint).  Having no desire to eat.  Feeling like you may vomit (nauseous).  Peeing (urinating) more at night.  Feeling confused. How is this treated?     This condition may be treated with:  Medicines. These can be given to treat blood pressure and to make the heart muscles stronger.  Changes in your daily life. These may include eating a healthy diet, staying at a healthy body weight, quitting tobacco and illegal drug use, or doing exercises.  Surgery. Surgery can be done to open blocked valves, or to put devices in  the heart, such as pacemakers.  A donor heart (heart transplant). You will receive a healthy heart from a donor. Follow these instructions at home:  Treat other conditions as told by your doctor. These may include high blood pressure, diabetes, thyroid disease, or abnormal heart rhythms.  Learn as much as you can about heart failure.  Get support as you need it.  Keep all follow-up visits as told by your doctor. This is important. Summary  Heart failure means that your heart is not able to pump blood in the right way.  This condition is caused by high blood pressure, heart attack, or damage of the heart muscle.  Symptoms of this condition include shortness of breath and swelling of the feet, ankles, legs, or belly. You may also feel very tired or feel like you may vomit.  You may be treated with medicines, surgery, or changes in your daily life.  Treat other health conditions as told by your doctor. This information is not intended to replace advice given to you by your health care provider. Make sure you discuss any questions you have with your health care provider. Document Revised: 08/18/2018 Document Reviewed: 08/18/2018 Elsevier Patient Education  [place].   Heart Failure, Self Care Heart failure is a serious condition. This sheet explains things you need to do to take care of yourself at home. To help you stay as healthy as possible, you may be asked to change your diet, take  certain medicines, and make other changes in your life. Your doctor may also give you more specific instructions. If you have problems or questions, call your doctor. What are the risks? Having heart failure makes it more likely for you to have some problems. These problems can get worse if you do not take good care of yourself. Problems may include:  Blood clotting problems. This may cause a stroke.  Damage to the kidneys, liver, or lungs.  Abnormal heart rhythms. Supplies needed:  Scale  for weighing yourself.  Blood pressure monitor.  Notebook.  Medicines. How to care for yourself when you have heart failure Medicines Take over-the-counter and prescription medicines only as told by your doctor. Take your medicines every day.  Do not stop taking your medicine unless your doctor tells you to do so.  Do not skip any medicines.  Get your prescriptions refilled before you run out of medicine. This is important. Eating and drinking   Eat heart-healthy foods. Talk with a diet specialist (dietitian) to create an eating plan.  Choose foods that: ? Have no trans fat. ? Are low in saturated fat and cholesterol.  Choose healthy foods, such as: ? Fresh or frozen fruits and vegetables. ? Fish. ? Low-fat (lean) meats. ? Legumes, such as beans, peas, and lentils. ? Fat-free or low-fat dairy products. ? Whole-grain foods. ? High-fiber foods.  Limit salt (sodium) if told by your doctor. Ask your diet specialist to tell you which seasonings are healthy for your heart.  Cook in healthy ways instead of frying. Healthy ways of cooking include roasting, grilling, broiling, baking, poaching, steaming, and stir-frying.  Limit how much fluid you drink, if told by your doctor. Alcohol use  Do not drink alcohol if: ? Your doctor tells you not to drink. ? Your heart was damaged by alcohol, or you have very bad heart failure. ? You are pregnant, may be pregnant, or are planning to become pregnant.  If you drink alcohol: ? Limit how much you use to:  0-1 drink a day for women.  0-2 drinks a day for men. ? Be aware of how much alcohol is in your drink. In the [place] one drink equals one 12 oz bottle of beer (355 mL), one 5 oz glass of wine (148 mL), or one 1 oz glass of hard liquor (44 mL). Lifestyle   Do not use any products that contain nicotine or tobacco, such as cigarettes, e-cigarettes, and chewing tobacco. If you need help quitting, ask your doctor. ? Do not use  nicotine gum or patches before talking to your doctor.  Do not use illegal drugs.  Lose weight if told by your doctor.  Do physical activity if told by your doctor. Talk to your doctor before you begin an exercise if: ? You are an older adult. ? You have very bad heart failure.  Learn to manage stress. If you need help, ask your doctor.  Get rehab (rehabilitation) to help you stay independent and to help with your quality of life.  Plan time to rest when you get tired. Check weight and blood pressure   Weigh yourself every day. This will help you to know if fluid is building up in your body. ? Weigh yourself every morning after you pee (urinate) and before you eat breakfast. ? Wear the same amount of clothing each time. ? Write down your daily weight. Give your record to your doctor.  Check and write down your blood pressure as told by  your doctor.  Check your pulse as told by your doctor. Dealing with very hot and very cold weather  If it is very hot: ? Avoid activities that take a lot of energy. ? Use air conditioning or fans, or find a cooler place. ? Avoid caffeine and alcohol. ? Wear clothing that is loose-fitting, lightweight, and light-colored.  If it is very cold: ? Avoid activities that take a lot of energy. ? Layer your clothes. ? Wear mittens or gloves, a hat, and a scarf when you go outside. ? Avoid alcohol. Follow these instructions at home:  Stay up to date with shots (vaccines). Get pneumococcal and flu (influenza) shots.  Keep all follow-up visits as told by your doctor. This is important. Contact a doctor if:  You gain weight quickly.  You have increasing shortness of breath.  You cannot do your normal activities.  You get tired easily.  You cough a lot.  You don't feel like eating or feel like you may vomit (nauseous).  You become puffy (swell) in your hands, feet, ankles, or belly (abdomen).  You cannot sleep well because it is hard to  breathe.  You feel like your heart is beating fast (palpitations).  You get dizzy when you stand up. Get help right away if:  You have trouble breathing.  You or someone else notices a change in your behavior, such as having trouble staying awake.  You have chest pain or discomfort.  You pass out (faint). These symptoms may be an emergency. Do not wait to see if the symptoms will go away. Get medical help right away. Call your local emergency services (911 in the [place] Do not drive yourself to the hospital. Summary  Heart failure is a serious condition. To care for yourself, you may have to change your diet, take medicines, and make other lifestyle changes.  Take your medicines every day. Do not stop taking them unless your doctor tells you to do so.  Eat heart-healthy foods, such as fresh or frozen fruits and vegetables, fish, lean meats, legumes, fat-free or low-fat dairy products, and whole-grain or high-fiber foods.  Ask your doctor if you can drink alcohol. You may have to stop alcohol use if you have very bad heart failure.  Contact your doctor if you gain weight quickly or feel that your heart is beating too fast. Get help right away if you pass out, or have chest pain or trouble breathing. This information is not intended to replace advice given to you by your health care provider. Make sure you discuss any questions you have with your health care provider. Document Revised: 09/12/2018 Document Reviewed: 09/13/2018 Elsevier Patient Education  2020 [employer]   Heart Failure Eating Plan Heart failure, also called congestive heart failure, occurs when your heart does not pump blood well enough to meet your body's needs for oxygen-rich blood. Heart failure is a long-term (chronic) condition. Living with heart failure can be challenging. However, following your health care provider's instructions about a healthy lifestyle and working with a diet and nutrition specialist  (dietitian) to choose the right foods may help to improve your symptoms. What are tips for following this plan? Reading food labels  Check food labels for the amount of sodium per serving. Choose foods that have less than 140 mg (milligrams) of sodium in each serving.  Check food labels for the number of calories per serving. This is important if you need to limit your daily calorie intake to lose weight.    Check food labels for the serving size. If you eat more than one serving, you will be eating more sodium and calories than what is listed on the label.  Look for foods that are labeled as "sodium-free," "very low sodium," or "low sodium." ? Foods labeled as "reduced sodium" or "lightly salted" may still have more sodium than what is recommended for you. Cooking  Avoid adding salt when cooking. Ask your health care provider or dietitian before using salt substitutes.  Season food with salt-free seasonings, spices, or herbs. Check the label of seasoning mixes to make sure they do not contain salt.  Cook with heart-healthy oils, such as olive, canola, soybean, or sunflower oil.  Do not fry foods. Cook foods using low-fat methods, such as baking, boiling, grilling, and broiling.  Limit unhealthy fats when cooking by: ? Removing the skin from poultry, such as chicken. ? Removing all visible fats from meats. ? Skimming the fat off from stews, soups, and gravies before serving them. Meal planning   Limit your intake of: ? Processed, canned, or pre-packaged foods. ? Foods that are high in trans fat, such as fried foods. ? Sweets, desserts, sugary drinks, and other foods with added sugar. ? Full-fat dairy products, such as whole milk.  Eat a balanced diet that includes: ? 4-5 servings of fruit each day and 4-5 servings of vegetables each day. At each meal, try to fill half of your plate with fruits and vegetables. ? Up to 6-8 servings of whole grains each day. ? Up to 2 servings of  lean meat, poultry, or fish each day. One serving of meat is equal to 3 oz. This is about the same size as a deck of cards. ? 2 servings of low-fat dairy each day. ? Heart-healthy fats. Healthy fats called omega-3 fatty acids are found in foods such as flaxseed and cold-water fish like sardines, salmon, and mackerel.  Aim to eat 25-35 g (grams) of fiber a day. Foods that are high in fiber include apples, broccoli, carrots, beans, peas, and whole grains.  Do not add salt or condiments that contain salt (such as soy sauce) to foods before eating.  When eating at a restaurant, ask that your food be prepared with less salt or no salt, if possible.  Try to eat 2 or more vegetarian meals each week.  Eat more home-cooked food and eat less restaurant, buffet, and fast food. General information  Do not eat more than 2,300 mg of salt (sodium) a day. The amount of sodium that is recommended for you may be lower, depending on your condition.  Maintain a healthy body weight as directed. Ask your health care provider what a healthy weight is for you. ? Check your weight every day. ? Work with your health care provider and dietitian to make a plan that is right for you to lose weight or maintain your current weight.  Limit how much fluid you drink. Ask your health care provider or dietitian how much fluid you can have each day.  Limit or avoid alcohol as told by your health care provider or dietitian. Recommended foods The items listed may not be a complete list. Talk with your dietitian about what dietary choices are best for you. Fruits All fresh, frozen, and canned fruits. Dried fruits, such as raisins, prunes, and cranberries. Vegetables All fresh vegetables. Vegetables that are frozen without sauce or added salt. Low-sodium or sodium-free canned vegetables. Grains Bread with less than 80 mg of sodium per   slice. Whole-wheat pasta, quinoa, and brown rice. Oats and oatmeal. Barley. Millet. Grits  and cream of wheat. Whole-grain and whole-wheat cold cereal. Meats and other protein foods Lean cuts of meat. Skinless chicken and turkey. Fish with high omega-3 fatty acids, such as salmon, sardines, and other cold-water fishes. Eggs. Dried beans, peas, and edamame. Unsalted nuts and nut butters. Dairy Low-fat or nonfat (skim) milk and dried milk. Rice milk, soy milk, and almond milk. Low-fat or nonfat yogurt. Small amounts of reduced-sodium block cheese. Low-sodium cottage cheese. Fats and oils Olive, canola, soybean, flaxseed, or sunflower oil. Avocado. Sweets and desserts Apple sauce. Granola bars. Sugar-free pudding and gelatin. Frozen fruit bars. Seasoning and other foods Fresh and dried herbs. Lemon or lime juice. Vinegar. Low-sodium ketchup. Salt-free marinades, salad dressings, sauces, and seasonings. The items listed above may not be a complete list of foods and beverages you can eat. Contact a dietitian for more information. Foods to avoid The items listed may not be a complete list. Talk with your dietitian about what dietary choices are best for you. Fruits Fruits that are dried with sodium-containing preservatives. Vegetables Canned vegetables. Frozen vegetables with sauce or seasonings. Creamed vegetables. French fries. Onion rings. Pickled vegetables and sauerkraut. Grains Bread with more than 80 mg of sodium per slice. Hot or cold cereal with more than 140 mg sodium per serving. Salted pretzels and crackers. Pre-packaged breadcrumbs. Bagels, croissants, and biscuits. Meats and other protein foods Ribs and chicken wings. Bacon, ham, pepperoni, bologna, salami, and packaged luncheon meats. Hot dogs, bratwurst, and sausage. Canned meat. Smoked meat and fish. Salted nuts and seeds. Dairy Whole milk, half-and-half, and cream. Buttermilk. Processed cheese, cheese spreads, and cheese curds. Regular cottage cheese. Feta cheese. Shredded cheese. String cheese. Fats and oils Butter,  lard, shortening, ghee, and bacon fat. Canned and packaged gravies. Seasoning and other foods Onion salt, garlic salt, table salt, and sea salt. Marinades. Regular salad dressings. Relishes, pickles, and olives. Meat flavorings and tenderizers, and bouillon cubes. Horseradish, ketchup, and mustard. Worcestershire sauce. Teriyaki sauce, soy sauce (including reduced sodium). Hot sauce and Tabasco sauce. Steak sauce, fish sauce, oyster sauce, and cocktail sauce. Taco seasonings. Barbecue sauce. Tartar sauce. The items listed above may not be a complete list of foods and beverages you should avoid. Contact a dietitian for more information. Summary  A heart failure eating plan includes changes that limit your intake of sodium and unhealthy fat, and it may help you lose weight or maintain a healthy weight. Your health care provider may also recommend limiting how much fluid you drink.  Most people with heart failure should eat no more than 2,300 mg of salt (sodium) a day. The amount of sodium that is recommended for you may be lower, depending on your condition.  Contact your health care provider or dietitian before making any major changes to your diet. This information is not intended to replace advice given to you by your health care provider. Make sure you discuss any questions you have with your health care provider. Document Revised: 07/27/2018 Document Reviewed: 10/15/2016 Elsevier Patient Education  2020 [employer]  

## 2019-10-29 NOTE — Progress Notes (Signed)
Progress Note  Patient Name: Chris [name] Date of Encounter: 10/29/2019  Primary Cardiologist: No primary care provider on file.   Subjective   Feels well, sitting on edge of bed.  No chest pain  Inpatient Medications    Scheduled Meds: . aspirin EC  81 mg Oral Daily  . atorvastatin  40 mg Oral q1800  . carvedilol  25 mg Oral BID WC  . furosemide  40 mg Oral Daily  . heparin  5,000 Units Subcutaneous Q8H  . isosorbide-hydrALAZINE  1 tablet Oral TID  . mometasone-formoterol  2 puff Inhalation BID  . montelukast  10 mg Oral QHS  . nicotine  21 mg Transdermal Daily  . pneumococcal 23 valent vaccine  0.5 mL Intramuscular Tomorrow-1000  . sodium chloride flush  3 mL Intravenous Once  . sodium chloride flush  3 mL Intravenous Q12H   Continuous Infusions: . sodium chloride    . nitroGLYCERIN Stopped (10/27/19 1139)   PRN Meds: sodium chloride, acetaminophen, albuterol, ALPRAZolam, benzonatate, fluticasone, ondansetron (ZOFRAN) IV, sodium chloride flush, traZODone   Vital Signs    Vitals:   10/28/19 2008 10/28/19 2358 10/29/19 0356 10/29/19 0821  BP: (!) 123/95 117/89 (!) 125/98 (!) 131/97  Pulse: 72  80 86  Resp: 18   18  Temp: 97.9 F (36.6 C) 97.7 F (36.5 C) (!) 97.5 F (36.4 C) 97.8 F (36.6 C)  TempSrc: Oral Oral Oral Oral  SpO2: 100%  100%   Weight:   129.6 kg   Height:        Intake/Output Summary (Last 24 hours) at 10/29/2019 1031 Last data filed at 10/29/2019 0253 Gross per 24 hour  Intake 420 ml  Output 1675 ml  Net -1255 ml   Last 3 Weights 10/29/2019 10/26/2019 10/26/2019  Weight (lbs) 285 lb 12.8 oz 287 lb 4.8 oz 270 lb  Weight (kg) 129.638 kg 130.318 kg 122.471 kg  Some encounter information is confidential and restricted. Go to Review Flowsheets activity to see all data.      Telemetry    Sinus rhythm- Personally Reviewed  ECG    No new- Personally Reviewed  Physical Exam   GEN: No acute distress.   Neck: No JVD Cardiac: RRR, no  murmurs, rubs, or gallops.  Respiratory: Clear to auscultation bilaterally. GI: Soft, nontender, non-distended  MS: No edema; No deformity. Neuro:  Nonfocal  Psych: Normal affect   Labs    High Sensitivity Troponin:   Recent Labs  Lab 10/26/19 1600 10/26/19 1804  TROPONINIHS 124* 133*      Chemistry Recent Labs  Lab 10/26/19 1600 10/26/19 1600 10/27/19 0357 10/28/19 0404 10/29/19 0331  NA 139   < > 139 139 140  K 5.3*   < > 4.6 4.9 4.8  CL 104   < > 101 97* 104  CO2 26   < > 26 29 29   GLUCOSE 100*   < > 106* 105* 92  BUN 30*   < > 32* 33* 35*  CREATININE 1.92*   < > 1.87* 2.27* 2.20*  CALCIUM 8.9   < > 8.7* 8.7* 8.4*  PROT 5.8*  --  5.9*  --   --   ALBUMIN 2.8*  --  2.8*  --   --   AST 41  --  34  --   --   ALT 56*  --  56*  --   --   ALKPHOS 64  --  64  --   --  BILITOT 0.6  --  0.6  --   --   GFRNONAA 38*   < > 39* 31* 32*  GFRAA 44*   < > 45* 36* 37*  ANIONGAP 9   < > 12 13 7    < > = values in this interval not displayed.     Hematology Recent Labs  Lab 10/26/19 1525 10/26/19 1804 10/27/19 0357  WBC 11.9* 11.8* 10.0  RBC 5.02 5.18 4.91  HGB 14.9 15.7 14.6  HCT 47.4 49.4 46.6  MCV 94.4 95.4 94.9  MCH 29.7 30.3 29.7  MCHC 31.4 31.8 31.3  RDW 15.3 15.3 15.1  PLT 240 252 223    BNP Recent Labs  Lab 10/26/19 1527  BNP 1,823.3*     DDimer No results for input(s): DDIMER in the last 168 hours.   Radiology    ECHOCARDIOGRAM COMPLETE  Result Date: 10/27/2019    ECHOCARDIOGRAM REPORT   Patient Name:   Chris [name] Date of Exam: 10/27/2019 Medical Rec #:  [id number]       Height:       72.0 in Accession #:    [id number]      Weight:       287.3 lb Date of Birth:  [birth year]       BSA:          2.485 m Patient Age:    59 years        BP:           151/103 mmHg Patient Gender: M               HR:           79 bpm. Exam Location:  Inpatient Procedure: 2D Echo, Cardiac Doppler and Color Doppler Indications:    CHF-Acute Systolic 123456 / [id number]   History:        Patient has prior history of Echocardiogram examinations, most                 recent 10/21/2013. CHF, COPD; Risk Factors:Hypertension. CKD                 (chronic kidney disease) stage 3, GFR 30-59 ml/min,Cocaine                 abuse,Tobacco dependence,Obesity.  Sonographer:    Alvino Chapel RCS Referring Phys: [phone] [place]  1. Left ventricular ejection fraction, by estimation, is 30 to 35%. The left ventricle has moderately decreased function. The left ventricle demonstrates regional wall motion abnormalities (see scoring diagram/findings for description). Diffuse hypokinesis with basal inferior akinesis. There is moderate left ventricular hypertrophy. Left ventricular diastolic parameters are consistent with Grade II diastolic dysfunction (pseudonormalization).  2. Right ventricular systolic function is low normal. The right ventricular size is normal. There is normal pulmonary artery systolic pressure. The estimated right ventricular systolic pressure is 0000000 mmHg.  3. Left atrial size was moderately dilated.  4. Right atrial size was mildly dilated.  5. The mitral valve is grossly normal. Mild mitral valve regurgitation.  6. The aortic valve is tricuspid. Aortic valve regurgitation is not visualized.  7. The inferior vena cava is dilated in size with >50% respiratory variability, suggesting right atrial pressure of 8 mmHg. FINDINGS  Left Ventricle: Left ventricular ejection fraction, by estimation, is 30 to 35%. The left ventricle has moderately decreased function. The left ventricle demonstrates regional wall motion abnormalities. The left ventricular internal cavity size was normal in size. There is  moderate left ventricular hypertrophy. Left ventricular diastolic parameters are consistent with Grade II diastolic dysfunction (pseudonormalization).  LV Wall Scoring: The basal inferior segment is akinetic. Right Ventricle: The right ventricular size is normal. No  increase in right ventricular wall thickness. Right ventricular systolic function is low normal. There is normal pulmonary artery systolic pressure. The tricuspid regurgitant velocity is 2.10 m/s,  and with an assumed right atrial pressure of 8 mmHg, the estimated right ventricular systolic pressure is 0000000 mmHg. Left Atrium: Left atrial size was moderately dilated. Right Atrium: Right atrial size was mildly dilated. Pericardium: There is no evidence of pericardial effusion. Mitral Valve: The mitral valve is grossly normal. Mild mitral valve regurgitation. Tricuspid Valve: The tricuspid valve is grossly normal. Tricuspid valve regurgitation is mild. Aortic Valve: The aortic valve is tricuspid. Aortic valve regurgitation is not visualized. Mild aortic valve annular calcification. Pulmonic Valve: The pulmonic valve was not well visualized. Pulmonic valve regurgitation is trivial. Aorta: The aortic root is normal in size and structure. Venous: The inferior vena cava is dilated in size with greater than 50% respiratory variability, suggesting right atrial pressure of 8 mmHg. IAS/Shunts: No atrial level shunt detected by color flow Doppler.  LEFT VENTRICLE PLAX 2D LVIDd:         6.70 cm      Diastology LVIDs:         5.50 cm      LV e' lateral:   6.24 cm/s LV PW:         1.40 cm      LV E/e' lateral: 11.0 LV IVS:        1.70 cm      LV e' medial:    4.22 cm/s LVOT diam:     2.20 cm      LV E/e' medial:  16.3 LV SV:         52 LV SV Index:   21 LVOT Area:     3.80 cm  LV Volumes (MOD) LV vol d, MOD A2C: 179.0 ml LV vol d, MOD A4C: 191.0 ml LV vol s, MOD A2C: 120.0 ml LV vol s, MOD A4C: 118.0 ml LV SV MOD A2C:     59.0 ml LV SV MOD A4C:     191.0 ml LV SV MOD BP:      74.6 ml RIGHT VENTRICLE RV S prime:     13.60 cm/s TAPSE (M-mode): 2.4 cm LEFT ATRIUM            Index       RIGHT ATRIUM           Index LA diam:      3.80 cm  1.53 cm/m  RA Area:     25.90 cm LA Vol (A2C): 96.7 ml  38.91 ml/m RA Volume:   98.50 ml   39.64 ml/m LA Vol (A4C): 111.0 ml 44.67 ml/m  AORTIC VALVE LVOT Vmax:   74.70 cm/s LVOT Vmean:  52.000 cm/s LVOT VTI:    0.137 m  AORTA Ao Root diam: 3.40 cm MITRAL VALVE               TRICUSPID VALVE MV Area (PHT): 4.35 cm    TR Peak grad:   17.6 mmHg MV Decel Time: 175 msec    TR Vmax:        210.00 cm/s MV E velocity: 68.90 cm/s MV A velocity: 53.10 cm/s  SHUNTS MV E/A ratio:  1.30  Systemic VTI:  0.14 m                            Systemic Diam: 2.20 cm Rozann Lesches MD Electronically signed by Rozann Lesches MD Signature Date/Time: 10/27/2019/12:46:58 PM    Final     Cardiac Studies   Echo-EF 35%  Patient Profile     59 y.o. male with known nonischemic cardiomyopathy EF is range between 65 and 35% most recently 40% at [facility] with normal left heart cath in 2015, cocaine use, CKD tobacco use hypertension here with hypertensive urgency.  Assessment & Plan    Hypertensive urgency with volume overload -Severely hypertensive on admission.  Pulmonary edema noted.  BNP 1800.  Nitroglycerin initially weaned off.  After IV Lasix troponin did increase slightly from 1.9-2.3.  IV Lasix was held yesterday.  Creatinine is currently 2.2. -We will go ahead and place him on Lasix 40 mg a day -Continue with carvedilol 25 twice daily -Continue with hydralazine/isosorbide  Chest pain demand ischemia elevated troponin -Mildly elevated troponin I 33 hypertensive crisis.  Prior left heart cath 2015 normal.  No signs of current acute ACS.  Continue with aspirin statin   Nonischemic cardiomyopathy -Longstanding history.  Likely hypertensive related as well as cocaine.  Noncompliance in the past. -Decision was made to continue with carvedilol and BiDil.  From my aspect, seems okay to discharge today.       For questions or updates, please contact [place] Please consult [email or web address] for contact info under        Signed, Candee Furbish, MD  10/29/2019, 10:31 AM

## 2019-10-29 NOTE — Progress Notes (Signed)
Patient ambulated in hall with slow steady gait. O2 saturations maintained at 95-98% with activity and cardiac monitor was SR 80's. Patient denied any shortness of breath, chest pain and denied feeling dizzy or lightheaded.

## 2019-10-29 NOTE — TOC Initial Note (Signed)
Transition of Care [facility]) - Initial/Assessment Note    Patient Details  Name: Chris [name] MRN: [id number] Date of Birth: Oct 30, 1960  Transition of Care [facility] CM/SW Contact:    Bethena Roys, RN Phone Number: 10/29/2019, 11:10 AM  Clinical Narrative:  High risk for areadission assessment completed. Patient presented for shortness of breath and abdominal pain. Patient has primary care provider- Dr. Wynetta Emery @ the [facility] and he gets medications from the community health and wellness clinic. Patient has transportation via the bus system and he makes it to his appointments. Case Manager received referral for substance abuse resources-Case Manager provided patient with outpatient substance abuse resources. Patient was appreciative. Patient states he wants to be discharged home today. Case Manager will continue to follow for additional transitoin of care needs.                Expected Discharge Plan: Home/Self Care Barriers to Discharge: Continued Medical Work up   Patient Goals and CMS Choice Patient states their goals for this hospitalization and ongoing recovery are:: to return home   Choice offered to / list presented to : NA  Expected Discharge Plan and Services Expected Discharge Plan: Home/Self Care In-house Referral: NA Discharge Planning Services: CM Consult Post Acute Care Choice: NA Living arrangements for the past 2 months: Single Family Home                 DME Arranged: N/A   HH Arranged: Refused [place] Agency: NA     Prior Living Arrangements/Services Living arrangements for the past 2 months: Single Family Home Lives with:: Friends Patient language and need for interpreter reviewed:: Yes Do you feel safe going back to the place where you live?: Yes      Need for Family Participation in Patient Care: Yes (Comment) Care giver support system in place?: Yes (comment)   Criminal Activity/Legal Involvement Pertinent to Current  Situation/Hospitalization: No - Comment as needed  Activities of Daily Living Home Assistive Devices/Equipment: None ADL Screening (condition at time of admission) Patient's cognitive ability adequate to safely complete daily activities?: Yes Is the patient deaf or have difficulty hearing?: No Does the patient have difficulty seeing, even when wearing glasses/contacts?: No Does the patient have difficulty concentrating, remembering, or making decisions?: No Patient able to express need for assistance with ADLs?: Yes Does the patient have difficulty dressing or bathing?: No Independently performs ADLs?: Yes (appropriate for developmental age) Does the patient have difficulty walking or climbing stairs?: No Weakness of Legs: None Weakness of Arms/Hands: None  Emotional Assessment Appearance:: Appears stated age Attitude/Demeanor/Rapport: Engaged Affect (typically observed): Appropriate Orientation: : Oriented to Situation, Oriented to  Time, Oriented to Place, Oriented to Self Alcohol / Substance Use: Illicit Drugs(Outpatient Substance Abuse Resources.) Psych Involvement: No (comment)  Admission diagnosis:  Hyperkalemia [E87.5] Demand ischemia (HCC) [I24.8] Elevated troponin 123456 Diastolic CHF ([place]) 123456 Acute on chronic diastolic (congestive) heart failure (HCC) [I50.33] Acute on chronic congestive heart failure, unspecified heart failure type ([place]) [I50.9] Patient Active Problem List   Diagnosis Date Noted  . Acute on chronic combined systolic and diastolic CHF (congestive heart failure) ([place]) 10/26/2019  . Elevated troponin I level 10/26/2019  . Acute on chronic diastolic (congestive) heart failure ([place]) 10/26/2019  . History of gout 02/01/2019  . Seasonal allergic rhinitis due to pollen 02/01/2019  . Tobacco dependence 11/30/2018  . Microscopic hematuria 11/30/2018  . Depression 11/30/2018  . Difficulty controlling anger 11/30/2018  . COPD (  chronic obstructive  pulmonary disease) ([place])   . CKD (chronic kidney disease) stage 3, GFR 30-59 ml/min 08/10/2018  . Recurrent epistaxis 04/21/2018  . Mixed hyperlipidemia 07/28/2017  . Essential hypertension 07/28/2017  . Chronic systolic heart failure ([place]) 10/25/2014  . Cocaine abuse ([place]) 02/20/2013  . Cannabis abuse 02/20/2013  . Back pain, chronic 02/20/2013   PCP:  [name], MD Pharmacy:   [place], [address] [place] [address] Phone: [phone] Fax: [phone]  [place] ([place] - [address] [place] [address] Phone: [phone] Fax: [phone]   Readmission Risk Interventions Readmission Risk Prevention Plan 10/29/2019  Transportation Screening Complete  PCP or Specialist Appt within 3-5 Days Complete  HRI or Home Care Consult Complete  Social Work Consult for [place] Planning/Counseling Complete  Palliative Care Screening Not Applicable  Medication Review Press photographer) Complete  Some recent data might be hidden

## 2019-10-29 NOTE — Discharge Summary (Signed)
Physician Discharge Summary  DEMILADE DUCASSE [id number] DOB: May 15, 1961 DOA: 10/26/2019  PCP: [name], MD  Admit date: 10/26/2019 Discharge date: 10/29/2019  Recommendations for Outpatient Follow-up:  Follow up with PCP in 7-10 days. Have chemistry checked at that visit. Follow up with cardiology in 4 weeks. STOP cocaine use. Seek rehab help as outpatient. Follow up with CHF clinic.  Discharge Diagnoses: Principal diagnosis is #1 1. Acute on chronic combined CHF 2. Elevated troponins secondary to cocaine use and demand ischemia 3. CKD IIIa 4. Hypertensive crisis with volume overload 5. Cocaine abuse 6. Nonischemic CMO  Discharge Condition: Fair  Disposition: Home  Diet recommendation: Heart healthy  Filed Weights   10/26/19 1518 10/26/19 2200 10/29/19 0356  Weight: 122.5 kg 130.3 kg 129.6 kg    History of present illness:  Chris [name] is a 59 y.o. male with medical history significant of Polysubstance abuse including cocaine and cannabis, chronic back pain, who presents complaining of shortness of breath and abdominal pain for several days.  He reports worsening shortness of breath over the past month.  States that he took cocaine last night to help with the abdominal pain.  As of breath is worse when he exerts himself as well as when he lies flat sleeping.  Symptoms are most notable at night which awaken him from sleep.  He has felt like he has had increased fluid retention in his abdomen and his legs.  He has had some wheezing but that has not been adequately controlled with his home inhaler.  He has abdominal tightness from the swelling and admits to using cocaine last night to help with the abdominal pain.  He also complains of pain in the right lower rib cage it is intermittent and is not associated with breathing and it is moderate in nature.  Patient has had his Covid vaccination.  He denies any fevers, chills, nausea, vomiting, cough, sputum production,  runny nose, sneezing, loss of taste smell or rash.  Currently not taking any diuretic.  Saw his primary care provider on May 5 and was prescribed Z-Pak and some steroids for an upper respiratory infection.  He has not been seen in person in quite some time.  ED Course: Patient in moderate respiratory discomfort on examination with fluid overload and lower extremity edema.  BNP is 1823.  Dust x-ray without edema or airspace opacity.  IV Lasix was given as well as a nitro drip.  Potassium was slightly elevated at pot 5.3 Lokelma was given.  Patient with elevated troponin of 123.  EKG shows sinus rhythm with biatrial enlargement and abnormal T waves.  Troponin elevation likely reflects manned ischemia.  Patient referred to [facility] hospitalist for admission  Hospital Course:  The patient was admitted to a telemetry bed. Cardiology was consulted. They felt that the patient's elevated troponins and volume overload were due to demand ischemia, non-ischemic CMO, and cocaine abuse. The patient was diuresed. His respiratory status was returned to baseline. Hyperkalemia was addresssed with lokelma. Hyperkalemia and hyponatremia are resolved. The patient was also given resources for drug rehab as outpatient. He was strongly encouraged to reach out for this help. His health and life depend on his leaving his cocaine habit behind.  Today's assessment: S: The patient is resting comfortably. No new complaints. He wants to go home. O: Vitals:  Vitals:   10/29/19 0821 10/29/19 1100  BP: (!) 131/97   Pulse: 86 76  Resp: 18   Temp: 97.8 F (36.6 C)  spray into both nostrils daily as needed for allergies or rhinitis.   furosemide 40 MG tablet Commonly known as: LASIX Take 1 tablet (40 mg total) by mouth daily. Start taking on: Oct 30, 2019 Notes to patient: Fluid pill   isosorbide-hydrALAZINE 20-37.5 MG tablet Commonly known as: BIDIL Take 1 tablet by mouth 3 (three) times daily. Notes to patient: Lowers blood pressure Increases blood flow to the heart   montelukast 10 MG tablet Commonly known as: SINGULAIR Take 1 tablet (10 mg total) by mouth at bedtime. Notes to patient: Treat asthma and allergies   nicotine 21 mg/24hr patch Commonly known as: NICODERM CQ - dosed in mg/24 hours Place 1 patch (21 mg total) onto the skin daily. Notes to patient: Aids in quitting smoking     traZODone 50 MG tablet Commonly known as: DESYREL Take 0.5-1 tablets (25-50 mg total) by mouth at bedtime as needed for sleep. Notes to patient: Depression/sleep       No Known Allergies  The  results of significant diagnostics from this hospitalization (including imaging, microbiology, ancillary and laboratory) are listed below for reference.    Significant Diagnostic Studies: DG Chest 2 View  Result Date: 10/26/2019 CLINICAL DATA:  Shortness of breath EXAM: CHEST - 2 VIEW COMPARISON:  Chest radiograph and chest CT August 10, 2018 FINDINGS: There is no appreciable edema or airspace opacity. There is cardiomegaly with pulmonary vascularity normal. No adenopathy. There is aortic atherosclerosis. No appreciable bone lesions. IMPRESSION: Cardiomegaly. No edema or airspace opacity. No evident adenopathy. Aortic Atherosclerosis (ICD10-I70.0). Electronically Signed   By: Lowella Grip III M.D.   On: 10/26/2019 16:17   DG Chest Port 1 View  Result Date: 10/27/2019 CLINICAL DATA:  Shortness of breath EXAM: PORTABLE CHEST 1 VIEW COMPARISON:  10/26/2019 FINDINGS: Moderate cardiomegaly is unchanged. There is pulmonary vascular congestion without overt edema. No pleural effusion or pneumothorax. IMPRESSION: Cardiomegaly and pulmonary vascular congestion without overt edema. Electronically Signed   By: Ulyses Jarred M.D.   On: 10/27/2019 03:56   ECHOCARDIOGRAM COMPLETE  Result Date: 10/27/2019    ECHOCARDIOGRAM REPORT   Patient Name:   Chris [name] Date of Exam: 10/27/2019 Medical Rec #:  [id number]       Height:       72.0 in Accession #:    [id number]      Weight:       287.3 lb Date of Birth:  04/08/1961       BSA:          2.485 m Patient Age:    59 years        BP:           151/103 mmHg Patient Gender: M               HR:           79 bpm. Exam Location:  Inpatient Procedure: 2D Echo, Cardiac Doppler and Color Doppler Indications:    CHF-Acute Systolic 123456 / [id number]  History:        Patient has prior history of Echocardiogram examinations, most                 recent 10/21/2013. CHF, COPD; Risk Factors:Hypertension. CKD                 (chronic kidney disease) stage 3, GFR 30-59  ml/min,Cocaine                 abuse,Tobacco dependence,Obesity.  Sonographer:  spray into both nostrils daily as needed for allergies or rhinitis.   furosemide 40 MG tablet Commonly known as: LASIX Take 1 tablet (40 mg total) by mouth daily. Start taking on: Oct 30, 2019 Notes to patient: Fluid pill   isosorbide-hydrALAZINE 20-37.5 MG tablet Commonly known as: BIDIL Take 1 tablet by mouth 3 (three) times daily. Notes to patient: Lowers blood pressure Increases blood flow to the heart   montelukast 10 MG tablet Commonly known as: SINGULAIR Take 1 tablet (10 mg total) by mouth at bedtime. Notes to patient: Treat asthma and allergies   nicotine 21 mg/24hr patch Commonly known as: NICODERM CQ - dosed in mg/24 hours Place 1 patch (21 mg total) onto the skin daily. Notes to patient: Aids in quitting smoking     traZODone 50 MG tablet Commonly known as: DESYREL Take 0.5-1 tablets (25-50 mg total) by mouth at bedtime as needed for sleep. Notes to patient: Depression/sleep       No Known Allergies  The  results of significant diagnostics from this hospitalization (including imaging, microbiology, ancillary and laboratory) are listed below for reference.    Significant Diagnostic Studies: DG Chest 2 View  Result Date: 10/26/2019 CLINICAL DATA:  Shortness of breath EXAM: CHEST - 2 VIEW COMPARISON:  Chest radiograph and chest CT August 10, 2018 FINDINGS: There is no appreciable edema or airspace opacity. There is cardiomegaly with pulmonary vascularity normal. No adenopathy. There is aortic atherosclerosis. No appreciable bone lesions. IMPRESSION: Cardiomegaly. No edema or airspace opacity. No evident adenopathy. Aortic Atherosclerosis (ICD10-I70.0). Electronically Signed   By: Lowella Grip III M.D.   On: 10/26/2019 16:17   DG Chest Port 1 View  Result Date: 10/27/2019 CLINICAL DATA:  Shortness of breath EXAM: PORTABLE CHEST 1 VIEW COMPARISON:  10/26/2019 FINDINGS: Moderate cardiomegaly is unchanged. There is pulmonary vascular congestion without overt edema. No pleural effusion or pneumothorax. IMPRESSION: Cardiomegaly and pulmonary vascular congestion without overt edema. Electronically Signed   By: Ulyses Jarred M.D.   On: 10/27/2019 03:56   ECHOCARDIOGRAM COMPLETE  Result Date: 10/27/2019    ECHOCARDIOGRAM REPORT   Patient Name:   Chris [name] Date of Exam: 10/27/2019 Medical Rec #:  [id number]       Height:       72.0 in Accession #:    [id number]      Weight:       287.3 lb Date of Birth:  04/08/1961       BSA:          2.485 m Patient Age:    59 years        BP:           151/103 mmHg Patient Gender: M               HR:           79 bpm. Exam Location:  Inpatient Procedure: 2D Echo, Cardiac Doppler and Color Doppler Indications:    CHF-Acute Systolic 123456 / [id number]  History:        Patient has prior history of Echocardiogram examinations, most                 recent 10/21/2013. CHF, COPD; Risk Factors:Hypertension. CKD                 (chronic kidney disease) stage 3, GFR 30-59  ml/min,Cocaine                 abuse,Tobacco dependence,Obesity.  Sonographer:  spray into both nostrils daily as needed for allergies or rhinitis.   furosemide 40 MG tablet Commonly known as: LASIX Take 1 tablet (40 mg total) by mouth daily. Start taking on: Oct 30, 2019 Notes to patient: Fluid pill   isosorbide-hydrALAZINE 20-37.5 MG tablet Commonly known as: BIDIL Take 1 tablet by mouth 3 (three) times daily. Notes to patient: Lowers blood pressure Increases blood flow to the heart   montelukast 10 MG tablet Commonly known as: SINGULAIR Take 1 tablet (10 mg total) by mouth at bedtime. Notes to patient: Treat asthma and allergies   nicotine 21 mg/24hr patch Commonly known as: NICODERM CQ - dosed in mg/24 hours Place 1 patch (21 mg total) onto the skin daily. Notes to patient: Aids in quitting smoking     traZODone 50 MG tablet Commonly known as: DESYREL Take 0.5-1 tablets (25-50 mg total) by mouth at bedtime as needed for sleep. Notes to patient: Depression/sleep       No Known Allergies  The  results of significant diagnostics from this hospitalization (including imaging, microbiology, ancillary and laboratory) are listed below for reference.    Significant Diagnostic Studies: DG Chest 2 View  Result Date: 10/26/2019 CLINICAL DATA:  Shortness of breath EXAM: CHEST - 2 VIEW COMPARISON:  Chest radiograph and chest CT August 10, 2018 FINDINGS: There is no appreciable edema or airspace opacity. There is cardiomegaly with pulmonary vascularity normal. No adenopathy. There is aortic atherosclerosis. No appreciable bone lesions. IMPRESSION: Cardiomegaly. No edema or airspace opacity. No evident adenopathy. Aortic Atherosclerosis (ICD10-I70.0). Electronically Signed   By: Lowella Grip III M.D.   On: 10/26/2019 16:17   DG Chest Port 1 View  Result Date: 10/27/2019 CLINICAL DATA:  Shortness of breath EXAM: PORTABLE CHEST 1 VIEW COMPARISON:  10/26/2019 FINDINGS: Moderate cardiomegaly is unchanged. There is pulmonary vascular congestion without overt edema. No pleural effusion or pneumothorax. IMPRESSION: Cardiomegaly and pulmonary vascular congestion without overt edema. Electronically Signed   By: Ulyses Jarred M.D.   On: 10/27/2019 03:56   ECHOCARDIOGRAM COMPLETE  Result Date: 10/27/2019    ECHOCARDIOGRAM REPORT   Patient Name:   Chris [name] Date of Exam: 10/27/2019 Medical Rec #:  [id number]       Height:       72.0 in Accession #:    [id number]      Weight:       287.3 lb Date of Birth:  04/08/1961       BSA:          2.485 m Patient Age:    59 years        BP:           151/103 mmHg Patient Gender: M               HR:           79 bpm. Exam Location:  Inpatient Procedure: 2D Echo, Cardiac Doppler and Color Doppler Indications:    CHF-Acute Systolic 123456 / [id number]  History:        Patient has prior history of Echocardiogram examinations, most                 recent 10/21/2013. CHF, COPD; Risk Factors:Hypertension. CKD                 (chronic kidney disease) stage 3, GFR 30-59  ml/min,Cocaine                 abuse,Tobacco dependence,Obesity.  Sonographer:  Physician Discharge Summary  DEMILADE DUCASSE [id number] DOB: May 15, 1961 DOA: 10/26/2019  PCP: [name], MD  Admit date: 10/26/2019 Discharge date: 10/29/2019  Recommendations for Outpatient Follow-up:  Follow up with PCP in 7-10 days. Have chemistry checked at that visit. Follow up with cardiology in 4 weeks. STOP cocaine use. Seek rehab help as outpatient. Follow up with CHF clinic.  Discharge Diagnoses: Principal diagnosis is #1 1. Acute on chronic combined CHF 2. Elevated troponins secondary to cocaine use and demand ischemia 3. CKD IIIa 4. Hypertensive crisis with volume overload 5. Cocaine abuse 6. Nonischemic CMO  Discharge Condition: Fair  Disposition: Home  Diet recommendation: Heart healthy  Filed Weights   10/26/19 1518 10/26/19 2200 10/29/19 0356  Weight: 122.5 kg 130.3 kg 129.6 kg    History of present illness:  Chris [name] is a 59 y.o. male with medical history significant of Polysubstance abuse including cocaine and cannabis, chronic back pain, who presents complaining of shortness of breath and abdominal pain for several days.  He reports worsening shortness of breath over the past month.  States that he took cocaine last night to help with the abdominal pain.  As of breath is worse when he exerts himself as well as when he lies flat sleeping.  Symptoms are most notable at night which awaken him from sleep.  He has felt like he has had increased fluid retention in his abdomen and his legs.  He has had some wheezing but that has not been adequately controlled with his home inhaler.  He has abdominal tightness from the swelling and admits to using cocaine last night to help with the abdominal pain.  He also complains of pain in the right lower rib cage it is intermittent and is not associated with breathing and it is moderate in nature.  Patient has had his Covid vaccination.  He denies any fevers, chills, nausea, vomiting, cough, sputum production,  runny nose, sneezing, loss of taste smell or rash.  Currently not taking any diuretic.  Saw his primary care provider on May 5 and was prescribed Z-Pak and some steroids for an upper respiratory infection.  He has not been seen in person in quite some time.  ED Course: Patient in moderate respiratory discomfort on examination with fluid overload and lower extremity edema.  BNP is 1823.  Dust x-ray without edema or airspace opacity.  IV Lasix was given as well as a nitro drip.  Potassium was slightly elevated at pot 5.3 Lokelma was given.  Patient with elevated troponin of 123.  EKG shows sinus rhythm with biatrial enlargement and abnormal T waves.  Troponin elevation likely reflects manned ischemia.  Patient referred to [facility] hospitalist for admission  Hospital Course:  The patient was admitted to a telemetry bed. Cardiology was consulted. They felt that the patient's elevated troponins and volume overload were due to demand ischemia, non-ischemic CMO, and cocaine abuse. The patient was diuresed. His respiratory status was returned to baseline. Hyperkalemia was addresssed with lokelma. Hyperkalemia and hyponatremia are resolved. The patient was also given resources for drug rehab as outpatient. He was strongly encouraged to reach out for this help. His health and life depend on his leaving his cocaine habit behind.  Today's assessment: S: The patient is resting comfortably. No new complaints. He wants to go home. O: Vitals:  Vitals:   10/29/19 0821 10/29/19 1100  BP: (!) 131/97   Pulse: 86 76  Resp: 18   Temp: 97.8 F (36.6 C)  spray into both nostrils daily as needed for allergies or rhinitis.   furosemide 40 MG tablet Commonly known as: LASIX Take 1 tablet (40 mg total) by mouth daily. Start taking on: Oct 30, 2019 Notes to patient: Fluid pill   isosorbide-hydrALAZINE 20-37.5 MG tablet Commonly known as: BIDIL Take 1 tablet by mouth 3 (three) times daily. Notes to patient: Lowers blood pressure Increases blood flow to the heart   montelukast 10 MG tablet Commonly known as: SINGULAIR Take 1 tablet (10 mg total) by mouth at bedtime. Notes to patient: Treat asthma and allergies   nicotine 21 mg/24hr patch Commonly known as: NICODERM CQ - dosed in mg/24 hours Place 1 patch (21 mg total) onto the skin daily. Notes to patient: Aids in quitting smoking     traZODone 50 MG tablet Commonly known as: DESYREL Take 0.5-1 tablets (25-50 mg total) by mouth at bedtime as needed for sleep. Notes to patient: Depression/sleep       No Known Allergies  The  results of significant diagnostics from this hospitalization (including imaging, microbiology, ancillary and laboratory) are listed below for reference.    Significant Diagnostic Studies: DG Chest 2 View  Result Date: 10/26/2019 CLINICAL DATA:  Shortness of breath EXAM: CHEST - 2 VIEW COMPARISON:  Chest radiograph and chest CT August 10, 2018 FINDINGS: There is no appreciable edema or airspace opacity. There is cardiomegaly with pulmonary vascularity normal. No adenopathy. There is aortic atherosclerosis. No appreciable bone lesions. IMPRESSION: Cardiomegaly. No edema or airspace opacity. No evident adenopathy. Aortic Atherosclerosis (ICD10-I70.0). Electronically Signed   By: Lowella Grip III M.D.   On: 10/26/2019 16:17   DG Chest Port 1 View  Result Date: 10/27/2019 CLINICAL DATA:  Shortness of breath EXAM: PORTABLE CHEST 1 VIEW COMPARISON:  10/26/2019 FINDINGS: Moderate cardiomegaly is unchanged. There is pulmonary vascular congestion without overt edema. No pleural effusion or pneumothorax. IMPRESSION: Cardiomegaly and pulmonary vascular congestion without overt edema. Electronically Signed   By: Ulyses Jarred M.D.   On: 10/27/2019 03:56   ECHOCARDIOGRAM COMPLETE  Result Date: 10/27/2019    ECHOCARDIOGRAM REPORT   Patient Name:   Chris [name] Date of Exam: 10/27/2019 Medical Rec #:  [id number]       Height:       72.0 in Accession #:    [id number]      Weight:       287.3 lb Date of Birth:  04/08/1961       BSA:          2.485 m Patient Age:    59 years        BP:           151/103 mmHg Patient Gender: M               HR:           79 bpm. Exam Location:  Inpatient Procedure: 2D Echo, Cardiac Doppler and Color Doppler Indications:    CHF-Acute Systolic 123456 / [id number]  History:        Patient has prior history of Echocardiogram examinations, most                 recent 10/21/2013. CHF, COPD; Risk Factors:Hypertension. CKD                 (chronic kidney disease) stage 3, GFR 30-59  ml/min,Cocaine                 abuse,Tobacco dependence,Obesity.  Sonographer:

## 2019-10-30 ENCOUNTER — Telehealth: Payer: Self-pay

## 2019-10-30 NOTE — Telephone Encounter (Signed)
Transition Care Management Follow-up Telephone Call Date of discharge and from where: 10/29/2019, [facility].  Call placed to # [phone], message left with call back requested to this CM. Patient needs to schedule [facility] appointment with Dr Wynetta Emery

## 2019-10-31 ENCOUNTER — Telehealth: Payer: Self-pay

## 2019-10-31 MED FILL — MONTELUKAST SOD 10 MG TAB: 10 | 90 days supply | Qty: 90 | Fill #0

## 2019-10-31 MED FILL — SYMBICORT 160-4.5 MCG INH: 160-4.5 | 30 days supply | Qty: 10 | Fill #1

## 2019-10-31 MED FILL — FLUTICASONE PROP 50 MCG SPR: 50 | 30 days supply | Qty: 16 | Fill #1

## 2019-10-31 MED FILL — ALBUTEROL SULFATE HFA 108 (: 108 (90 BAS | 25 days supply | Qty: 18 | Fill #1

## 2019-10-31 MED FILL — ATORVASTATIN CALCIUM 40 MG: 40 | 30 days supply | Qty: 30 | Fill #0

## 2019-10-31 MED FILL — CARVEDILOL 25 MG TABLET: 25 | 90 days supply | Qty: 180 | Fill #1

## 2019-10-31 NOTE — Telephone Encounter (Signed)
Transition Care Management Follow-up Telephone Call Attempt # 2  Date of discharge and from where: 10/29/2019, [facility].  Call placed to # [phone], message left with call back requested to this CM.  He has scheduled an appointment with Dr Wynetta Emery - 11/13/2019

## 2019-10-31 NOTE — Telephone Encounter (Signed)
Transition Care Management Follow-up Telephone Call  Date of discharge and from where: 10/29/2019, [facility]   How have you been since you were released from the hospital? He said that he is not doing very well and needs to get himself back on the right track and get his life in order.  The hospitalization was a scare.   Any questions or concerns?  he is concerned about getting his medications.   He explained that he has missed his anger management classes and he would like a letter from his PCP excusing his absence.  Instructed him to speak to Dr Wynetta Emery about this tomorrow at his appt.   Items Reviewed:  Did the pt receive and understand the discharge instructions provided? he doesn't have them   Medications obtained and verified?  has no medications and is  anxious to get what he needs. Does not have the discharge medication list.  Informed him that this CM would ask [name] pharmacy to fill all of his current medications today and he can pick them up this afternoon.  We can provide him with his hospital discharge AVS with the meds list if he wishes.  Explained to him that he has medicaid and if he does not have his copays for medications, most, if not all of the costs, can be charged to an account that he can pay off when he is able. He said he has no money now and does not receive his disability check until 11/13/2019.  He said he would come to pick up the medications this afternoon,   As per Georgiann Mohs, [facility] the cost for his meds today is $24 all of which he can charge.  Aspirin is OTC and he would need to purchase that. The order for tessalon had been transferred to another pharmacy. There are no refills for the nicotin patch and the trazodone is 16 days early for refill.    Any new allergies since your discharge?  none reported   Do you have support at home?  he is staying with someone  Other (ie: DME, Home Health, etc) no home health or DME ordered.   Has home BP monitor.  Does  not have a scale.   Functional Questionnaire: (I = Independent and D = Dependent) ADL's: independent   Follow up appointments reviewed:    PCP Hospital f/u appt confirmed?he wanted to be seen as soon as possible.  appt scheduled for tomorrow - 11/01/2019 @ [facility] f/u appt confirmed? .none scheduled at this time   Are transportation arrangements needed?  he said a friend can drive him.  Explained to him that he has medicaid and can register for transportation to his medical appointment, s  If their condition worsens, is the pt aware to call  their PCP or go to the ED? yes.  He said this was reviewed thoroughly when he was in the hospital  Was the patient provided with contact information for the PCP's office or ED? He has the phone number  Was the pt encouraged to call back with questions or concerns? yes

## 2019-11-01 ENCOUNTER — Ambulatory Visit: Payer: Medicaid Other | Attending: Internal Medicine | Admitting: Internal Medicine

## 2019-11-01 ENCOUNTER — Other Ambulatory Visit: Payer: Self-pay

## 2019-11-01 ENCOUNTER — Encounter: Payer: Self-pay | Admitting: Internal Medicine

## 2019-11-01 VITALS — BP 131/84 | HR 82 | Temp 98.1°F | Resp 16 | Wt 288.8 lb

## 2019-11-01 DIAGNOSIS — I10 Essential (primary) hypertension: Secondary | ICD-10-CM | POA: Diagnosis not present

## 2019-11-01 DIAGNOSIS — M109 Gout, unspecified: Secondary | ICD-10-CM | POA: Insufficient documentation

## 2019-11-01 DIAGNOSIS — J449 Chronic obstructive pulmonary disease, unspecified: Secondary | ICD-10-CM | POA: Diagnosis not present

## 2019-11-01 DIAGNOSIS — Z7951 Long term (current) use of inhaled steroids: Secondary | ICD-10-CM | POA: Insufficient documentation

## 2019-11-01 DIAGNOSIS — I5042 Chronic combined systolic (congestive) and diastolic (congestive) heart failure: Secondary | ICD-10-CM | POA: Insufficient documentation

## 2019-11-01 DIAGNOSIS — F17200 Nicotine dependence, unspecified, uncomplicated: Secondary | ICD-10-CM

## 2019-11-01 DIAGNOSIS — F17210 Nicotine dependence, cigarettes, uncomplicated: Secondary | ICD-10-CM | POA: Diagnosis not present

## 2019-11-01 DIAGNOSIS — Z7982 Long term (current) use of aspirin: Secondary | ICD-10-CM | POA: Diagnosis not present

## 2019-11-01 DIAGNOSIS — N1832 Chronic kidney disease, stage 3b: Secondary | ICD-10-CM | POA: Diagnosis not present

## 2019-11-01 DIAGNOSIS — Z09 Encounter for follow-up examination after completed treatment for conditions other than malignant neoplasm: Secondary | ICD-10-CM | POA: Diagnosis not present

## 2019-11-01 DIAGNOSIS — I130 Hypertensive heart and chronic kidney disease with heart failure and stage 1 through stage 4 chronic kidney disease, or unspecified chronic kidney disease: Secondary | ICD-10-CM | POA: Insufficient documentation

## 2019-11-01 DIAGNOSIS — Z79899 Other long term (current) drug therapy: Secondary | ICD-10-CM | POA: Diagnosis not present

## 2019-11-01 DIAGNOSIS — F1410 Cocaine abuse, uncomplicated: Secondary | ICD-10-CM | POA: Diagnosis not present

## 2019-11-01 DIAGNOSIS — E785 Hyperlipidemia, unspecified: Secondary | ICD-10-CM | POA: Diagnosis not present

## 2019-11-01 DIAGNOSIS — I428 Other cardiomyopathies: Secondary | ICD-10-CM | POA: Insufficient documentation

## 2019-11-01 DIAGNOSIS — Z8249 Family history of ischemic heart disease and other diseases of the circulatory system: Secondary | ICD-10-CM | POA: Insufficient documentation

## 2019-11-01 DIAGNOSIS — E782 Mixed hyperlipidemia: Secondary | ICD-10-CM | POA: Insufficient documentation

## 2019-11-01 MED ORDER — ISOSORB DINITRATE-HYDRALAZINE 20-37.5 MG PO TABS: 1.0000 | ORAL_TABLET | Freq: Three times a day (TID) | ORAL | 6 refills | Status: DC

## 2019-11-01 MED ORDER — FUROSEMIDE 40 MG PO TABS: 60.0000 mg | ORAL_TABLET | Freq: Every day | ORAL | 6 refills | Status: DC

## 2019-11-01 MED ORDER — CARVEDILOL 25 MG PO TABS: 25.0000 mg | ORAL_TABLET | Freq: Two times a day (BID) | ORAL | 6 refills | Status: DC

## 2019-11-01 MED FILL — FUROSEMIDE 40 MG TAB: 40 | 20 days supply | Qty: 30 | Fill #0

## 2019-11-01 NOTE — Patient Instructions (Signed)
Increase furosemide to 40 mg 1-1/2 tablets daily. Check your blood pressure at least twice a week.  The goal is 130/80 or lower. Continue to limit salt in the foods as much as possible.

## 2019-11-01 NOTE — Progress Notes (Signed)
Patient ID: Chris [name], male    DOB: 1961/04/11  MRN: [id number]  CC: Transition of care visit Hospitalized: 5/14-17/2021 Transition of care phone call from case worker: 10/31/2019  Subjective: Chris [name] is a 59 y.o. male who presents for transition of care visit.  Patient's daughter joined [place] via cell phone. His concerns today include:  Pt with hx of HTN, HL,CKD 3,gout, tob dep, subst abuse, ? COPD.  Patient presents as hospital follow-up for transition of care. Patient admitted with worsening shortness of breath, lower extremity edema and feeling of abdominal retention of fluid for 1 month.  He was assessed to be fluid overloaded.  BNP was 1823, chest x-ray revealed cardiomegaly and pulmonary vascular congestion.  He had elevated troponins in the setting of ongoing cocaine use.  Echo revealed left ventricular ejection fraction with an EF of 30-35% and grade 2 diastolic dysfunction. Cardiology felt that the elevated troponins and for volume overload was due to demand ischemia associated with cocaine abuse.  Patient was started on guideline directed therapy.  Advised to get into a treatment program for his drug abuse and to follow-up with CHF clinic.  He was discharged on Entresto, carvedilol, BiDil, atorvastatin, furosemide.  Cozaar and amlodipine were discontinued.  Today:  Patient reports that his breathing is a little better.  He denies any PND.  He sleeps on 2 pillows.  Denies lower extremity edema.  He does not have a scale at home but his daughter intends to get him 1.  His weight is up 3 pounds from discharge.  He does not have a home blood pressure monitoring device.  He has been trying to limit salt in the foods. -He just went to the pharmacy today to pick up medications that he was discharged on.  Substance abuse: Reports that he has not used since discharge from the hospital.  He states that he has signed up for some drug treatment class and will be going every  Wednesday.  Tobacco dependence: He has nicotine patches at home and has been using them.  He is down to 2 cigarettes a day.  Previously he was smoking 2 packs/day.  States that he is trying to quit.  COPD: Reports that he still has wheezing at times.  He has a nebulizer machine at home.  He has been started on Symbicort inhaler from the hospital and he has it with him today.  He also has albuterol inhaler. Patient Active Problem List   Diagnosis Date Noted  . Acute on chronic combined systolic and diastolic CHF (congestive heart failure) ([place]) 10/26/2019  . Elevated troponin I level 10/26/2019  . Acute on chronic diastolic (congestive) heart failure ([place]) 10/26/2019  . History of gout 02/01/2019  . Seasonal allergic rhinitis due to pollen 02/01/2019  . Tobacco dependence 11/30/2018  . Microscopic hematuria 11/30/2018  . Depression 11/30/2018  . Difficulty controlling anger 11/30/2018  . COPD (chronic obstructive pulmonary disease) ([place])   . CKD (chronic kidney disease) stage 3, GFR 30-59 ml/min 08/10/2018  . Recurrent epistaxis 04/21/2018  . Mixed hyperlipidemia 07/28/2017  . Essential hypertension 07/28/2017  . Chronic systolic heart failure ([place]) 10/25/2014  . Cocaine abuse ([place]) 02/20/2013  . Cannabis abuse 02/20/2013  . Back pain, chronic 02/20/2013     Current Outpatient Medications on File Prior to Visit  Medication Sig Dispense Refill  . albuterol (VENTOLIN HFA) 108 (90 Base) MCG/ACT inhaler Inhale 2 puffs into the lungs every 6 (six) hours as needed for wheezing  or shortness of breath. 18 g 1  . aspirin EC 81 MG EC tablet Take 1 tablet (81 mg total) by mouth daily. 30 tablet 0  . atorvastatin (LIPITOR) 40 MG tablet Take 1 tablet (40 mg total) by mouth daily at 6 PM. 30 tablet 2  . benzonatate (TESSALON) 100 MG capsule Take 2 capsules (200 mg total) by mouth 3 (three) times daily as needed. (Patient taking differently: Take 200 mg by mouth 3 (three) times daily as needed for  cough. ) 40 capsule 0  . Blood Pressure Monitor DEVI Please provide patient with insurance approved blood pressure monitor 1 each 0  . budesonide-formoterol (SYMBICORT) 160-4.5 MCG/ACT inhaler Inhale 2 puffs into the lungs 2 (two) times daily. 1 Inhaler 3  . fluticasone (FLONASE) 50 MCG/ACT nasal spray Place 1 spray into both nostrils daily as needed for allergies or rhinitis. 16 g 1  . montelukast (SINGULAIR) 10 MG tablet Take 1 tablet (10 mg total) by mouth at bedtime. 90 tablet 1  . nicotine (NICODERM CQ - DOSED IN MG/24 HOURS) 21 mg/24hr patch Place 1 patch (21 mg total) onto the skin daily. 28 patch 0  . traZODone (DESYREL) 50 MG tablet Take 0.5-1 tablets (25-50 mg total) by mouth at bedtime as needed for sleep. 30 tablet 3   No current facility-administered medications on file prior to visit.    No Known Allergies  Social History   Socioeconomic History  . Marital status: Single    Spouse name: Not on file  . Number of children: Not on file  . Years of education: Not on file  . Highest education level: Not on file  Occupational History  . Not on file  Tobacco Use  . Smoking status: Current Every Day Smoker    Packs/day: 1.00    Years: 20.00    Pack years: 20.00    Types: Cigarettes  . Smokeless tobacco: Never Used  . Tobacco comment: 3/4 of a pack  Substance and Sexual Activity  . Alcohol use: No  . Drug use: Yes    Frequency: 21.0 times per week    Types: Marijuana, Cocaine    Comment: no longer- Cocaine  . Sexual activity: Not on file  Other Topics Concern  . Not on file  Social History Narrative   ** Merged History Encounter **       Social Determinants of Health   Financial Resource Strain:   . Difficulty of Paying Living Expenses:   Food Insecurity:   . Worried About Charity fundraiser in the Last Year:   . Arboriculturist in the Last Year:   Transportation Needs:   . Film/video editor (Medical):   [name] Lack of Transportation (Non-Medical):    Physical Activity:   . Days of Exercise per Week:   . Minutes of Exercise per Session:   Stress:   . Feeling of Stress :   Social Connections:   . Frequency of Communication with Friends and Family:   . Frequency of Social Gatherings with Friends and Family:   . Attends Religious Services:   . Active Member of Clubs or Organizations:   . Attends Archivist Meetings:   [name] Marital Status:   Intimate Partner Violence:   . Fear of Current or Ex-Partner:   . Emotionally Abused:   [name] Physically Abused:   . Sexually Abused:     Family History  Problem Relation Age of Onset  . Heart disease Father  Past Surgical History:  Procedure Laterality Date  . ANKLE SURGERY    . CARDIAC CATHETERIZATION    . SHOULDER SURGERY      ROS: Review of Systems Negative except as stated above  PHYSICAL EXAM: BP 131/84   Pulse 82   Temp 98.1 F (36.7 C)   Resp 16   Wt 288 lb 12.8 oz (131 kg)   SpO2 94%   BMI 39.17 kg/m   Wt Readings from Last 3 Encounters:  11/01/19 288 lb 12.8 oz (131 kg)  10/29/19 285 lb 12.8 oz (129.6 kg)  11/30/18 250 lb 9.6 oz (113.7 kg)    Physical Exam  General appearance - alert, well appearing, and in no distress Mental status - normal mood, behavior, speech, dress, motor activity, and thought processes Neck - supple, no significant adenopathy Chest -mild diffuse wheezing.  No crackles or rhonchi's. Heart - normal rate, regular rhythm, normal S1, S2, no murmurs, rubs, clicks or gallops Extremities -1+ lower extremity edema. Neurologic exam: Nonfocal CMP Latest Ref Rng & Units 10/29/2019 10/28/2019 10/27/2019  Glucose 70 - 99 mg/dL 92 105(H) 106(H)  BUN 6 - 20 mg/dL 35(H) 33(H) 32(H)  Creatinine 0.61 - 1.24 mg/dL 2.20(H) 2.27(H) 1.87(H)  Sodium 135 - 145 mmol/L 140 139 139  Potassium 3.5 - 5.1 mmol/L 4.8 4.9 4.6  Chloride 98 - 111 mmol/L 104 97(L) 101  CO2 22 - 32 mmol/L 29 29 26   Calcium 8.9 - 10.3 mg/dL 8.4(L) 8.7(L) 8.7(L)  Total Protein  6.5 - 8.1 g/dL - - 5.9(L)  Total Bilirubin 0.3 - 1.2 mg/dL - - 0.6  Alkaline Phos 38 - 126 U/L - - 64  AST 15 - 41 U/L - - 34  ALT 0 - 44 U/L - - 56(H)   Lipid Panel     Component Value Date/Time   CHOL 197 11/30/2018 1023   CHOL 157 10/21/2013 0435   TRIG 108 11/30/2018 1023   TRIG 117 10/21/2013 0435   HDL 34 (L) 11/30/2018 1023   HDL 30 (L) 10/21/2013 0435   CHOLHDL 5.8 (H) 11/30/2018 1023   VLDL 23 10/21/2013 0435   LDLCALC 141 (H) 11/30/2018 1023   LDLCALC 104 (H) 10/21/2013 0435    CBC    Component Value Date/Time   WBC 10.0 10/27/2019 0357   RBC 4.91 10/27/2019 0357   HGB 14.6 10/27/2019 0357   HGB 14.7 11/30/2018 1023   HCT 46.6 10/27/2019 0357   HCT 44.0 11/30/2018 1023   PLT 223 10/27/2019 0357   PLT 185 11/30/2018 1023   MCV 94.9 10/27/2019 0357   MCV 91 11/30/2018 1023   MCV 92 10/21/2013 0435   MCH 29.7 10/27/2019 0357   MCHC 31.3 10/27/2019 0357   RDW 15.1 10/27/2019 0357   RDW 14.6 11/30/2018 1023   RDW 15.7 (H) 10/21/2013 0435   LYMPHSABS 2.0 10/27/2019 0357   LYMPHSABS 1.1 10/21/2013 0435   MONOABS 0.8 10/27/2019 0357   MONOABS 0.6 10/21/2013 0435   EOSABS 0.2 10/27/2019 0357   EOSABS 0.0 10/21/2013 0435   BASOSABS 0.1 10/27/2019 0357   BASOSABS 0.0 10/21/2013 0435    ASSESSMENT AND PLAN: 1. Hospital discharge follow-up 2. Chronic combined systolic and diastolic CHF (congestive heart failure) ([facility]) -Slight fluid overload.  Increase furosemide to 60 mg daily. -DASH diet discussed and encourage Advised patient to check his weight at least once a week.  If weight increases by 5 or more pounds and is associated with increased lower extremity edema, advised  to take an extra dose of furosemide that day. Continue carvedilol, Entresto, BiDil.  Will refer to cardiology Strongly advised to discontinue street drug use - Comprehensive metabolic panel - furosemide (LASIX) 40 MG tablet; Take 1.5 tablets (60 mg total) by mouth daily.  Dispense: 45 tablet;  Refill: 6 - isosorbide-hydrALAZINE (BIDIL) 20-37.5 MG tablet; Take 1 tablet by mouth 3 (three) times daily.  Dispense: 90 tablet; Refill: 6 - carvedilol (COREG) 25 MG tablet; Take 1 tablet (25 mg total) by mouth 2 (two) times daily with a meal.  Dispense: 120 tablet; Refill: 6 - Ambulatory referral to Cardiology  3. NICM (nonischemic cardiomyopathy) ([place]) See #2 above  4. Essential hypertension Not at goal but patient has not taken some of his medicines as yet today.  Discussed and encourage compliance with medications.  Advised to check blood pressure at least twice a week with goal being 130/80 or lower - carvedilol (COREG) 25 MG tablet; Take 1 tablet (25 mg total) by mouth 2 (two) times daily with a meal.  Dispense: 120 tablet; Refill: 6  5. Stage 3b chronic kidney disease - Ambulatory referral to Nephrology  6. Cocaine abuse ([place]) Discussed and encouraged him to discontinue use of all street drugs.  He reports that he has signed up for an outpatient treatment class.  We will have our social worker follow-up with him to make sure that he follows through.  7. Tobacco dependence Patient advised to quit smoking. Discussed health risks associated with smoking including lung and other types of cancers, chronic lung diseases and CV risks.. Pt ready to give trail of quitting.  He has nicotine patches at home which he states that he will continue to use.  Encouraged him to set a quit date    Patient was given the opportunity to ask questions.  Patient verbalized understanding of the plan and was able to repeat key elements of the plan.   Orders Placed This Encounter  Procedures  . Comprehensive metabolic panel  . Ambulatory referral to Cardiology  . Ambulatory referral to Nephrology     Requested Prescriptions   Signed Prescriptions Disp Refills  . furosemide (LASIX) 40 MG tablet 45 tablet 6    Sig: Take 1.5 tablets (60 mg total) by mouth daily.  . isosorbide-hydrALAZINE (BIDIL)  20-37.5 MG tablet 90 tablet 6    Sig: Take 1 tablet by mouth 3 (three) times daily.  . carvedilol (COREG) 25 MG tablet 120 tablet 6    Sig: Take 1 tablet (25 mg total) by mouth 2 (two) times daily with a meal.    Return in about 6 weeks (around 12/13/2019) for in person.  Karle Plumber, MD, FACP

## 2019-11-02 ENCOUNTER — Other Ambulatory Visit: Payer: Self-pay | Admitting: Internal Medicine

## 2019-11-02 DIAGNOSIS — E875 Hyperkalemia: Secondary | ICD-10-CM

## 2019-11-02 LAB — COMPREHENSIVE METABOLIC PANEL
ALT: 46 IU/L — ABNORMAL HIGH (ref 0–44)
AST: 30 IU/L (ref 0–40)
Albumin/Globulin Ratio: 1.4 (ref 1.2–2.2)
Albumin: 3.7 g/dL — ABNORMAL LOW (ref 3.8–4.9)
Alkaline Phosphatase: 81 IU/L (ref 48–121)
BUN/Creatinine Ratio: 19 (ref 9–20)
BUN: 37 mg/dL — ABNORMAL HIGH (ref 6–24)
Bilirubin Total: 0.2 mg/dL (ref 0.0–1.2)
CO2: 26 mmol/L (ref 20–29)
Calcium: 8.8 mg/dL (ref 8.7–10.2)
Chloride: 102 mmol/L (ref 96–106)
Creatinine, Ser: 1.97 mg/dL — ABNORMAL HIGH (ref 0.76–1.27)
GFR calc Af Amer: 42 mL/min/{1.73_m2} — ABNORMAL LOW (ref >59)
GFR calc non Af Amer: 37 mL/min/{1.73_m2} — ABNORMAL LOW (ref >59)
Globulin, Total: 2.6 g/dL (ref 1.5–4.5)
Glucose: 84 mg/dL (ref 65–99)
Potassium: 5.4 mmol/L — ABNORMAL HIGH (ref 3.5–5.2)
Sodium: 143 mmol/L (ref 134–144)
Total Protein: 6.3 g/dL (ref 6.0–8.5)

## 2019-11-02 NOTE — Progress Notes (Signed)
Let patient know that his kidney function is not 100% but slightly improved from several days ago when he was discharged from the hospital.  Potassium level is mildly elevated.  I do not see that he is taking any potassium supplement.  He should try to cut back on potassium rich foods like bananas, oranges, orange juice.  Please return to the lab in 1 week for repeat potassium level.  He has mild elevation in one of his liver enzymes but this has decreased compared to when he was in the hospital several days ago.

## 2019-11-03 ENCOUNTER — Telehealth: Payer: Self-pay

## 2019-11-03 NOTE — Telephone Encounter (Signed)
Contacted pt to go over lab results pt is aware and doesn't have any questions or concerns 

## 2019-11-07 ENCOUNTER — Ambulatory Visit: Payer: Medicaid Other | Attending: Internal Medicine

## 2019-11-07 ENCOUNTER — Other Ambulatory Visit: Payer: Self-pay

## 2019-11-07 DIAGNOSIS — E875 Hyperkalemia: Secondary | ICD-10-CM

## 2019-11-07 DIAGNOSIS — Z131 Encounter for screening for diabetes mellitus: Secondary | ICD-10-CM

## 2019-11-07 DIAGNOSIS — D696 Thrombocytopenia, unspecified: Secondary | ICD-10-CM

## 2019-11-07 DIAGNOSIS — E785 Hyperlipidemia, unspecified: Secondary | ICD-10-CM | POA: Diagnosis not present

## 2019-11-07 DIAGNOSIS — Z125 Encounter for screening for malignant neoplasm of prostate: Secondary | ICD-10-CM | POA: Diagnosis not present

## 2019-11-07 DIAGNOSIS — I10 Essential (primary) hypertension: Secondary | ICD-10-CM | POA: Diagnosis not present

## 2019-11-07 MED FILL — MITIGARE 0.6 MG CAPSULE: 0.6 | 90 days supply | Qty: 90 | Fill #1

## 2019-11-07 MED FILL — ALLOPURINOL 100 MG TABLET: 100 | 90 days supply | Qty: 90 | Fill #1

## 2019-11-13 ENCOUNTER — Other Ambulatory Visit: Payer: Self-pay | Admitting: Nurse Practitioner

## 2019-11-13 ENCOUNTER — Ambulatory Visit: Payer: Medicaid Other | Admitting: Internal Medicine

## 2019-11-13 MED ORDER — ACCU-CHEK GUIDE VI STRP: ORAL_STRIP | 12 refills | Status: DC

## 2019-11-13 MED ORDER — GLIPIZIDE 5 MG PO TABS
2.5000 mg | ORAL_TABLET | Freq: Two times a day (BID) | ORAL | 2 refills | Status: DC
Start: 2019-11-13 — End: 2020-01-14

## 2019-11-13 MED ORDER — ACCU-CHEK GUIDE ME W/DEVICE KIT: 1.0000 | PACK | Freq: Once | 0 refills | Status: AC

## 2019-11-13 MED ORDER — ACCU-CHEK SOFTCLIX LANCETS MISC: 3 refills | Status: DC

## 2019-11-13 MED ORDER — LANCING DEVICE MISC: 1.0000 | Freq: Every day | 0 refills | Status: DC

## 2019-11-13 MED FILL — glipiZIDE 5 MG TABS: 5 | 30 days supply | Qty: 30 | Fill #0

## 2019-11-13 MED FILL — ACCU-CHEK GUIDE TEST STRIP: 90 days supply | Qty: 100 | Fill #0

## 2019-11-13 MED FILL — ACCU-CHEK SOFTCLIX LANCETS: 90 days supply | Qty: 100 | Fill #0

## 2019-11-13 MED FILL — ACCU-CHEK GUIDE W/DEVICE KI: W/DEVICE | 30 days supply | Qty: 1 | Fill #0

## 2019-11-16 ENCOUNTER — Telehealth: Payer: Self-pay | Admitting: Licensed Clinical Social Worker

## 2019-11-16 LAB — CBC
Hematocrit: 46.2 % (ref 37.5–51.0)
Hemoglobin: 15.4 g/dL (ref 13.0–17.7)
MCH: 30.1 pg (ref 26.6–33.0)
MCHC: 33.3 g/dL (ref 31.5–35.7)
MCV: 90 fL (ref 79–97)
Platelets: 245 x10E3/uL (ref 150–450)
RBC: 5.11 x10E6/uL (ref 4.14–5.80)
RDW: 14.2 % (ref 11.6–15.4)
WBC: 12 x10E3/uL — ABNORMAL HIGH (ref 3.4–10.8)

## 2019-11-16 LAB — CMP14+EGFR
ALT: 24 IU/L (ref 0–44)
AST: 19 IU/L (ref 0–40)
Albumin/Globulin Ratio: 1.5 (ref 1.2–2.2)
Albumin: 3.8 g/dL (ref 3.8–4.9)
Alkaline Phosphatase: 87 IU/L (ref 48–121)
BUN/Creatinine Ratio: 16 (ref 9–20)
BUN: 35 mg/dL — ABNORMAL HIGH (ref 6–24)
Bilirubin Total: 0.3 mg/dL (ref 0.0–1.2)
CO2: 26 mmol/L (ref 20–29)
Calcium: 9.9 mg/dL (ref 8.7–10.2)
Chloride: 100 mmol/L (ref 96–106)
Creatinine, Ser: 2.25 mg/dL — ABNORMAL HIGH (ref 0.76–1.27)
GFR calc Af Amer: 36 mL/min/{1.73_m2} — ABNORMAL LOW (ref >59)
GFR calc non Af Amer: 31 mL/min/{1.73_m2} — ABNORMAL LOW (ref >59)
Globulin, Total: 2.6 g/dL (ref 1.5–4.5)
Glucose: 93 mg/dL (ref 65–99)
Potassium: 5.6 mmol/L — ABNORMAL HIGH (ref 3.5–5.2)
Sodium: 141 mmol/L (ref 134–144)
Total Protein: 6.4 g/dL (ref 6.0–8.5)

## 2019-11-16 LAB — HEMOGLOBIN A1C
Est. average glucose Bld gHb Est-mCnc: 140 mg/dL
Hgb A1c MFr Bld: 6.5 % — ABNORMAL HIGH (ref 4.8–5.6)

## 2019-11-16 LAB — ALDOSTERONE + RENIN ACTIVITY W/ RATIO
ALDOS/RENIN RATIO: 6.9 (ref 0.0–30.0)
ALDOSTERONE: 4.5 ng/dL (ref 0.0–30.0)
Renin: 0.649 ng/mL/h (ref 0.167–5.380)

## 2019-11-16 LAB — LIPID PANEL
Chol/HDL Ratio: 4 ratio (ref 0.0–5.0)
Cholesterol, Total: 160 mg/dL (ref 100–199)
HDL: 40 mg/dL
LDL Chol Calc (NIH): 92 mg/dL (ref 0–99)
Triglycerides: 163 mg/dL — ABNORMAL HIGH (ref 0–149)
VLDL Cholesterol Cal: 28 mg/dL (ref 5–40)

## 2019-11-16 LAB — PSA: Prostate Specific Ag, Serum: 0.6 ng/mL (ref 0.0–4.0)

## 2019-11-16 NOTE — Telephone Encounter (Signed)
Call placed to patient regarding [facility] referral. LCSW left message requesting a return call.  

## 2019-11-19 ENCOUNTER — Ambulatory Visit: Payer: Medicaid Other | Admitting: Rheumatology

## 2019-11-20 ENCOUNTER — Telehealth: Payer: Self-pay | Admitting: Licensed Clinical Social Worker

## 2019-11-20 NOTE — Telephone Encounter (Signed)
Call placed to patient regarding [facility] referral. LCSW left message requesting a return call.  

## 2019-11-23 ENCOUNTER — Telehealth: Payer: Self-pay | Admitting: Internal Medicine

## 2019-11-23 NOTE — Telephone Encounter (Signed)
Patient called in and requested to speak with LCSW regarding a voicemail that was left for him. Please follow up at your earliest convenience.

## 2019-11-26 MED FILL — glipiZIDE 5 MG TABS: 5 | 90 days supply | Qty: 90 | Fill #0

## 2019-11-26 MED FILL — ALBUTEROL SUL 2.5 MG/3 ML S: (2.5 MG/3ML | 25 days supply | Qty: 150 | Fill #1

## 2019-11-26 NOTE — Telephone Encounter (Signed)
Patient called and requested for lab results. Patient was identified by 2 patient identifiers. Patient was given lab results. Patient verbalized understanding and had no further questions or concerns at this time.

## 2019-12-04 ENCOUNTER — Telehealth: Payer: Self-pay | Admitting: Internal Medicine

## 2019-12-04 MED FILL — ACCU-CHEK SOFTCLIX LANCETS: 90 days supply | Qty: 100 | Fill #0

## 2019-12-04 MED FILL — ACCU-CHEK GUIDE TEST STRIP: 90 days supply | Qty: 100 | Fill #0

## 2019-12-04 MED FILL — ACCU-CHEK GUIDE W/DEVICE KI: W/DEVICE | 30 days supply | Qty: 1 | Fill #0

## 2019-12-04 NOTE — Telephone Encounter (Signed)
Patient called saying he was prescribed diabetics medication and would like an Rx for a meter to check his sugars. Patient uses [facility] pharmacy. Please f/u

## 2019-12-04 NOTE — Telephone Encounter (Signed)
Prescriptions have been filled in our pharmacy.

## 2019-12-04 NOTE — Telephone Encounter (Signed)
Will you prescribe?

## 2019-12-07 ENCOUNTER — Telehealth: Payer: Self-pay | Admitting: Licensed Clinical Social Worker

## 2019-12-07 NOTE — Telephone Encounter (Signed)
LCSW placed call to pt regarding [facility] referral. LCSW left message requesting a return call.

## 2019-12-13 ENCOUNTER — Ambulatory Visit: Payer: Medicaid Other | Admitting: Internal Medicine

## 2019-12-24 ENCOUNTER — Inpatient Hospital Stay (HOSPITAL_COMMUNITY)
Admission: EM | Admit: 2019-12-24 | Discharge: 2019-12-27 | DRG: 291 | Disposition: A | Discharge status: Home / Self Care | Payer: Medicaid Other | Attending: Internal Medicine | Admitting: Internal Medicine

## 2019-12-24 ENCOUNTER — Emergency Department (HOSPITAL_COMMUNITY): Payer: Medicaid Other

## 2019-12-24 ENCOUNTER — Encounter (HOSPITAL_COMMUNITY): Payer: Self-pay

## 2019-12-24 DIAGNOSIS — J9621 Acute and chronic respiratory failure with hypoxia: Secondary | ICD-10-CM | POA: Diagnosis present

## 2019-12-24 DIAGNOSIS — I130 Hypertensive heart and chronic kidney disease with heart failure and stage 1 through stage 4 chronic kidney disease, or unspecified chronic kidney disease: Principal | ICD-10-CM | POA: Diagnosis present

## 2019-12-24 DIAGNOSIS — J9620 Acute and chronic respiratory failure, unspecified whether with hypoxia or hypercapnia: Secondary | ICD-10-CM | POA: Diagnosis present

## 2019-12-24 DIAGNOSIS — Z7982 Long term (current) use of aspirin: Secondary | ICD-10-CM

## 2019-12-24 DIAGNOSIS — Z20822 Contact with and (suspected) exposure to covid-19: Secondary | ICD-10-CM | POA: Diagnosis present

## 2019-12-24 DIAGNOSIS — R062 Wheezing: Secondary | ICD-10-CM | POA: Diagnosis not present

## 2019-12-24 DIAGNOSIS — M109 Gout, unspecified: Secondary | ICD-10-CM | POA: Diagnosis present

## 2019-12-24 DIAGNOSIS — I10 Essential (primary) hypertension: Secondary | ICD-10-CM

## 2019-12-24 DIAGNOSIS — N183 Chronic kidney disease, stage 3 unspecified: Secondary | ICD-10-CM

## 2019-12-24 DIAGNOSIS — Z7984 Long term (current) use of oral hypoglycemic drugs: Secondary | ICD-10-CM

## 2019-12-24 DIAGNOSIS — J441 Chronic obstructive pulmonary disease with (acute) exacerbation: Secondary | ICD-10-CM | POA: Diagnosis present

## 2019-12-24 DIAGNOSIS — F17210 Nicotine dependence, cigarettes, uncomplicated: Secondary | ICD-10-CM | POA: Diagnosis present

## 2019-12-24 DIAGNOSIS — I2510 Atherosclerotic heart disease of native coronary artery without angina pectoris: Secondary | ICD-10-CM | POA: Diagnosis present

## 2019-12-24 DIAGNOSIS — I5042 Chronic combined systolic (congestive) and diastolic (congestive) heart failure: Secondary | ICD-10-CM

## 2019-12-24 DIAGNOSIS — Z7951 Long term (current) use of inhaled steroids: Secondary | ICD-10-CM

## 2019-12-24 DIAGNOSIS — Z79899 Other long term (current) drug therapy: Secondary | ICD-10-CM

## 2019-12-24 DIAGNOSIS — F1410 Cocaine abuse, uncomplicated: Secondary | ICD-10-CM | POA: Diagnosis present

## 2019-12-24 DIAGNOSIS — R4182 Altered mental status, unspecified: Secondary | ICD-10-CM | POA: Diagnosis present

## 2019-12-24 DIAGNOSIS — E785 Hyperlipidemia, unspecified: Secondary | ICD-10-CM

## 2019-12-24 DIAGNOSIS — J811 Chronic pulmonary edema: Secondary | ICD-10-CM | POA: Diagnosis not present

## 2019-12-24 DIAGNOSIS — N1832 Chronic kidney disease, stage 3b: Secondary | ICD-10-CM | POA: Diagnosis present

## 2019-12-24 DIAGNOSIS — E782 Mixed hyperlipidemia: Secondary | ICD-10-CM | POA: Diagnosis present

## 2019-12-24 DIAGNOSIS — F1210 Cannabis abuse, uncomplicated: Secondary | ICD-10-CM | POA: Diagnosis present

## 2019-12-24 DIAGNOSIS — J440 Chronic obstructive pulmonary disease with acute lower respiratory infection: Secondary | ICD-10-CM

## 2019-12-24 DIAGNOSIS — J9622 Acute and chronic respiratory failure with hypercapnia: Secondary | ICD-10-CM | POA: Diagnosis present

## 2019-12-24 DIAGNOSIS — R61 Generalized hyperhidrosis: Secondary | ICD-10-CM | POA: Diagnosis present

## 2019-12-24 DIAGNOSIS — F17200 Nicotine dependence, unspecified, uncomplicated: Secondary | ICD-10-CM

## 2019-12-24 DIAGNOSIS — E1122 Type 2 diabetes mellitus with diabetic chronic kidney disease: Secondary | ICD-10-CM

## 2019-12-24 DIAGNOSIS — I509 Heart failure, unspecified: Secondary | ICD-10-CM | POA: Diagnosis not present

## 2019-12-24 DIAGNOSIS — K219 Gastro-esophageal reflux disease without esophagitis: Secondary | ICD-10-CM | POA: Diagnosis present

## 2019-12-24 DIAGNOSIS — Z8249 Family history of ischemic heart disease and other diseases of the circulatory system: Secondary | ICD-10-CM

## 2019-12-24 DIAGNOSIS — R0902 Hypoxemia: Secondary | ICD-10-CM | POA: Diagnosis not present

## 2019-12-24 DIAGNOSIS — G8929 Other chronic pain: Secondary | ICD-10-CM | POA: Diagnosis present

## 2019-12-24 DIAGNOSIS — J209 Acute bronchitis, unspecified: Secondary | ICD-10-CM

## 2019-12-24 DIAGNOSIS — R7989 Other specified abnormal findings of blood chemistry: Secondary | ICD-10-CM | POA: Diagnosis present

## 2019-12-24 DIAGNOSIS — R0602 Shortness of breath: Secondary | ICD-10-CM | POA: Diagnosis not present

## 2019-12-24 DIAGNOSIS — I161 Hypertensive emergency: Secondary | ICD-10-CM | POA: Diagnosis present

## 2019-12-24 DIAGNOSIS — I110 Hypertensive heart disease with heart failure: Secondary | ICD-10-CM | POA: Diagnosis not present

## 2019-12-24 DIAGNOSIS — I517 Cardiomegaly: Secondary | ICD-10-CM | POA: Diagnosis not present

## 2019-12-24 DIAGNOSIS — I428 Other cardiomyopathies: Secondary | ICD-10-CM | POA: Diagnosis present

## 2019-12-24 DIAGNOSIS — F329 Major depressive disorder, single episode, unspecified: Secondary | ICD-10-CM | POA: Diagnosis present

## 2019-12-24 DIAGNOSIS — J449 Chronic obstructive pulmonary disease, unspecified: Secondary | ICD-10-CM | POA: Diagnosis present

## 2019-12-24 DIAGNOSIS — I5043 Acute on chronic combined systolic (congestive) and diastolic (congestive) heart failure: Secondary | ICD-10-CM | POA: Diagnosis present

## 2019-12-24 DIAGNOSIS — R778 Other specified abnormalities of plasma proteins: Secondary | ICD-10-CM | POA: Diagnosis present

## 2019-12-24 HISTORY — DX: Gastro-esophageal reflux disease without esophagitis: K21.9

## 2019-12-24 HISTORY — DX: Type 2 diabetes mellitus without complications: E11.9

## 2019-12-24 HISTORY — DX: Atherosclerotic heart disease of native coronary artery without angina pectoris: I25.10

## 2019-12-24 LAB — CBC
HCT: 46.9 % (ref 39.0–52.0)
Hemoglobin: 15.1 g/dL (ref 13.0–17.0)
MCH: 29.4 pg (ref 26.0–34.0)
MCHC: 32.2 g/dL (ref 30.0–36.0)
MCV: 91.4 fL (ref 80.0–100.0)
Platelets: 190 10*3/uL (ref 150–400)
RBC: 5.13 MIL/uL (ref 4.22–5.81)
RDW: 15.7 % — ABNORMAL HIGH (ref 11.5–15.5)
WBC: 10 10*3/uL (ref 4.0–10.5)
nRBC: 0 % (ref 0.0–0.2)

## 2019-12-24 MED ORDER — FUROSEMIDE 10 MG/ML IJ SOLN
40.0000 mg | Freq: Once | INTRAMUSCULAR | Status: AC
  Administered 2019-12-24: 40 mg via INTRAVENOUS
  Filled 2019-12-24: qty 4

## 2019-12-24 MED ORDER — NITROGLYCERIN IN D5W 200-5 MCG/ML-% IV SOLN
0.0000 ug/min | INTRAVENOUS | Status: DC
  Administered 2019-12-24: 5 ug/min via INTRAVENOUS
  Filled 2019-12-24: qty 250

## 2019-12-24 MED ORDER — IPRATROPIUM-ALBUTEROL 0.5-2.5 (3) MG/3ML IN SOLN
3.0000 mL | Freq: Once | RESPIRATORY_TRACT | Status: AC
  Administered 2019-12-24: 3 mL via RESPIRATORY_TRACT
  Filled 2019-12-24: qty 3

## 2019-12-24 NOTE — ED Provider Notes (Addendum)
[facility] EMERGENCY DEPARTMENT Provider Note   CSN: [id number] Arrival date & time: 12/24/19  2207     History No chief complaint on file.   Chris [name] is a 59 y.o. male.  59 year old male with past medical history of CHF, CKD, COPD, cocaine abuse, brought in by EMS from home for shortness of breath. Reports using his albuterol nebulizer for [facility] today, continued to feel [facility] all day. Patient states while sitting in a chair this evening he felt sweaty which made him feel very anxious and more SHOB. Reports missing 1 dose of Lasix last week, has taken it today. Denies CP or syncope. Patient was brought in on O2 at 4L, not normally on home O2, due to accessory muscle use and tachypnia. Reports daily alcohol use, has not had any alcohol today, is a daily smoker, denies cocaine use today.  Recently hospitalized 5/14-17 for acute on chronic CHF, nonischemic cardiomyopathy related to hypertension.         Past Medical History:  Diagnosis Date  . CHF (congestive heart failure) ([place])   . Chronic kidney disease   . COPD (chronic obstructive pulmonary disease) ([place])   . Coronary artery disease   . Depression   . Diabetes mellitus without complication ([place])   . GERD (gastroesophageal reflux disease)   . Gout   . Hypertension   . Influenza A with respiratory manifestations   . Mental disorder     Patient Active Problem List   Diagnosis Date Noted  . Type 2 diabetes mellitus with stage 3 chronic kidney disease ([place]) 12/25/2019  . Acute on chronic combined systolic and diastolic CHF (congestive heart failure) ([place]) 10/26/2019  . Elevated troponin I level 10/26/2019  . Acute on chronic diastolic (congestive) heart failure ([place]) 10/26/2019  . History of gout 02/01/2019  . Seasonal allergic rhinitis due to pollen 02/01/2019  . Tobacco dependence 11/30/2018  . Microscopic hematuria 11/30/2018  . Depression 11/30/2018  . Difficulty controlling anger 11/30/2018    . COPD (chronic obstructive pulmonary disease) ([place])   . CKD (chronic kidney disease) stage 3, GFR 30-59 ml/min 08/10/2018  . Recurrent epistaxis 04/21/2018  . Mixed hyperlipidemia 07/28/2017  . Essential hypertension 07/28/2017  . Chronic systolic heart failure ([place]) 10/25/2014  . Cocaine abuse ([place]) 02/20/2013  . Cannabis abuse 02/20/2013  . Back pain, chronic 02/20/2013    Past Surgical History:  Procedure Laterality Date  . ANKLE SURGERY    . CARDIAC CATHETERIZATION    . SHOULDER SURGERY         Family History  Problem Relation Age of Onset  . Heart disease Father     Social History   Tobacco Use  . Smoking status: Current Every Day Smoker    Packs/day: 1.00    Years: 20.00    Pack years: 20.00    Types: Cigarettes  . Smokeless tobacco: Never Used  . Tobacco comment: 3/4 of a pack  Vaping Use  . Vaping Use: Never used  Substance Use Topics  . Alcohol use: No  . Drug use: Yes    Frequency: 21.0 times per week    Types: Marijuana, Cocaine    Comment: no longer- Cocaine    Home Medications Prior to Admission medications   Medication Sig Start Date End Date Taking? Authorizing Provider  albuterol (VENTOLIN HFA) 108 (90 Base) MCG/ACT inhaler Inhale 2 puffs into the lungs every 6 (six) hours as needed for wheezing or shortness of breath. 09/11/19  Yes Mayers, Cari S, PA-C  aspirin EC 81 MG EC tablet Take 1 tablet (81 mg total) by mouth daily. 10/30/19  Yes Swayze, Ava, DO  atorvastatin (LIPITOR) 40 MG tablet Take 1 tablet (40 mg total) by mouth daily at 6 PM. 07/09/19  Yes [name], NP  budesonide-formoterol (SYMBICORT) 160-4.5 MCG/ACT inhaler Inhale 2 puffs into the lungs 2 (two) times daily. 09/11/19  Yes Mayers, Cari S, PA-C  carvedilol (COREG) 25 MG tablet Take 1 tablet (25 mg total) by mouth 2 (two) times daily with a meal. 11/01/19  Yes [name], MD  fluticasone (FLONASE) 50 MCG/ACT nasal spray Place 1 spray into both nostrils daily as needed  for allergies or rhinitis. 09/11/19  Yes Mayers, Cari S, PA-C  furosemide (LASIX) 40 MG tablet Take 1.5 tablets (60 mg total) by mouth daily. 11/01/19  Yes [name], MD  Accu-Chek Softclix Lancets lancets Use as instructed. Inject into the skin once daily 11/13/19   [name], NP  benzonatate (TESSALON) 100 MG capsule Take 2 capsules (200 mg total) by mouth 3 (three) times daily as needed. Patient not taking: Reported on 12/24/2019 10/17/19   [name], PA-C  Blood Pressure Monitor DEVI Please provide patient with insurance approved blood pressure monitor 07/09/19   [name], NP  glipiZIDE (GLUCOTROL) 5 MG tablet Take 0.5 tablets (2.5 mg total) by mouth 2 (two) times daily before a meal. 11/13/19 12/13/19  [name], NP  glucose blood (ACCU-CHEK GUIDE) test strip Use as instructed. Check blood glucose by fingerstick once per day. 11/13/19   [name], NP  isosorbide-hydrALAZINE (BIDIL) 20-37.5 MG tablet Take 1 tablet by mouth 3 (three) times daily. 11/01/19   [name], MD  Lancet Devices (LANCING DEVICE) MISC 1 Device by Does not apply route daily. 11/13/19   [name], NP  montelukast (SINGULAIR) 10 MG tablet Take 1 tablet (10 mg total) by mouth at bedtime. 09/11/19 03/09/20  Mayers, Cari S, PA-C  nicotine (NICODERM CQ - DOSED IN MG/24 HOURS) 21 mg/24hr patch Place 1 patch (21 mg total) onto the skin daily. 09/11/19   Mayers, Cari S, PA-C  traZODone (DESYREL) 50 MG tablet Take 0.5-1 tablets (25-50 mg total) by mouth at bedtime as needed for sleep. 10/17/19   [name], PA-C    Allergies    Patient has no known allergies.  Review of Systems   Review of Systems  Constitutional: Positive for diaphoresis. Negative for fever.  Respiratory: Positive for shortness of breath. Negative for chest tightness.   Cardiovascular: Negative for chest pain and leg swelling.  Gastrointestinal: Negative for abdominal pain, constipation, diarrhea, nausea and  vomiting.  Genitourinary: Negative for decreased urine volume and difficulty urinating.  Musculoskeletal: Negative for arthralgias and myalgias.  Skin: Negative for rash and wound.  Neurological: Negative for weakness.  Psychiatric/Behavioral: Negative for confusion.  All other systems reviewed and are negative.   Physical Exam Updated Vital Signs BP (!) 177/113   Pulse 73   Temp (!) 97.5 F (36.4 C) (Oral)   Resp (!) 38   Ht 5\' 6"  (1.676 m)   Wt 127 kg   SpO2 90%   BMI 45.19 kg/m   Physical Exam Vitals and nursing note reviewed.  Constitutional:      General: He is not in acute distress.    Appearance: He is well-developed. He is diaphoretic.  HENT:     Head: Normocephalic and atraumatic.  Cardiovascular:     Rate and Rhythm: Normal rate and regular rhythm.     Pulses: Normal pulses.     Heart sounds: Normal heart sounds.  Pulmonary:     Effort: Tachypnea present.     Breath sounds: Wheezing present.  Chest:     Chest wall: No tenderness.  Abdominal:     Palpations: Abdomen is soft.     Tenderness: There is no abdominal tenderness.  Musculoskeletal:     Right lower leg: No edema.     Left lower leg: No edema.  Skin:    Findings: No erythema or rash.  Neurological:     Mental Status: He is alert and oriented to person, place, and time.  Psychiatric:        Behavior: Behavior normal.     ED Results / Procedures / Treatments   Labs (all labs ordered are listed, but only abnormal results are displayed) Labs Reviewed  BRAIN NATRIURETIC PEPTIDE - Abnormal; Notable for the following components:      Result Value   B Natriuretic Peptide 1,530.4 (*)    All other components within normal limits  CBC - Abnormal; Notable for the following components:   RDW 15.7 (*)    All other components within normal limits  BASIC METABOLIC PANEL - Abnormal; Notable for the following components:   BUN 30 (*)    Creatinine, Ser 2.07 (*)    Calcium 8.5 (*)    GFR calc non Af  Amer 35 (*)    GFR calc Af Amer 40 (*)    All other components within normal limits  RAPID URINE DRUG SCREEN, HOSP PERFORMED - Abnormal; Notable for the following components:   Cocaine POSITIVE (*)    All other components within normal limits  I-STAT ARTERIAL BLOOD GAS, ED - Abnormal; Notable for the following components:   pH, Arterial 7.635 (*)    pCO2 arterial 19.3 (*)    pO2, Arterial 22 (*)    All other components within normal limits  TROPONIN I (HIGH SENSITIVITY) - Abnormal; Notable for the following components:   Troponin I (High Sensitivity) 83 (*)    All other components within normal limits  TROPONIN I (HIGH SENSITIVITY) - Abnormal; Notable for the following components:   Troponin I (High Sensitivity) 78 (*)    All other components within normal limits  SARS CORONAVIRUS 2 BY RT PCR (HOSPITAL ORDER, [facility])  HIV ANTIBODY (ROUTINE TESTING W REFLEX)  BLOOD GAS, ARTERIAL  HEMOGLOBIN A1C  TROPONIN I (HIGH SENSITIVITY)    EKG EKG Interpretation  Date/Time:  Monday December 24 2019 22:15:06 EDT Ventricular Rate:  71 PR Interval:    QRS Duration: 105 QT Interval:  459 QTC Calculation: 499 R Axis:   -12 Text Interpretation: Sinus rhythm Non-specific ST-t changes Confirmed by Lajean Saver [phone]) on 12/24/2019 10:21:49 PM   Radiology DG Chest Port 1 View  Result Date: 12/24/2019 CLINICAL DATA:  Dyspnea, hypoxia EXAM: PORTABLE CHEST 1 VIEW COMPARISON:  10/27/2019 FINDINGS: The left costophrenic angle is excluded from view on this portable semi-upright chest radiograph. The visualized lungs are clear. No pneumothorax. No pleural effusion on the right. Moderate cardiomegaly is stable when compared to prior examination. There is central pulmonary vascular congestion again noted without overt pulmonary edema. IMPRESSION: Technically limited, but stable examination with unchanged cardiomegaly and central pulmonary vascular congestion. Electronically  Signed   By: Fidela Salisbury MD   On: 12/24/2019 22:50  Procedures .Critical Care Performed by: Tacy Learn, PA-C Authorized by: Tacy Learn, PA-C   Critical care provider statement:    Critical care time (minutes):  45   Critical care was time spent personally by me on the following activities:  Discussions with consultants, evaluation of patient's response to treatment, examination of patient, ordering and performing treatments and interventions, ordering and review of laboratory studies, ordering and review of radiographic studies, pulse oximetry, re-evaluation of patient's condition, obtaining history from patient or surrogate and review of old charts   (including critical care time)  Medications Ordered in ED Medications  nitroGLYCERIN 50 mg in dextrose 5 % 250 mL (0.2 mg/mL) infusion (20 mcg/min Intravenous Rate/Dose Change 12/25/19 0420)  aspirin chewable tablet 81 mg (has no administration in time range)  atorvastatin (LIPITOR) tablet 40 mg (has no administration in time range)  isosorbide-hydrALAZINE (BIDIL) 20-37.5 MG per tablet 1 tablet (has no administration in time range)  mometasone-formoterol (DULERA) 200-5 MCG/ACT inhaler 2 puff (has no administration in time range)  carvedilol (COREG) tablet 25 mg (has no administration in time range)  ipratropium-albuterol (DUONEB) 0.5-2.5 (3) MG/3ML nebulizer solution 3 mL (has no administration in time range)  sodium chloride flush (NS) 0.9 % injection 3 mL (has no administration in time range)  sodium chloride flush (NS) 0.9 % injection 3 mL (has no administration in time range)  0.9 %  sodium chloride infusion (has no administration in time range)  acetaminophen (TYLENOL) tablet 650 mg (has no administration in time range)  ondansetron (ZOFRAN) injection 4 mg (has no administration in time range)  enoxaparin (LOVENOX) injection 40 mg (has no administration in time range)  furosemide (LASIX) injection 60 mg (has no  administration in time range)  potassium chloride SA (KLOR-CON) CR tablet 10 mEq (0 mEq Oral Hold 12/25/19 0227)  sacubitril-valsartan (ENTRESTO) 24-26 mg per tablet (0 tablets Oral Hold 12/25/19 0228)  insulin aspart (novoLOG) injection 0-20 Units (has no administration in time range)  ipratropium-albuterol (DUONEB) 0.5-2.5 (3) MG/3ML nebulizer solution 3 mL (3 mLs Nebulization Given 12/24/19 2310)  furosemide (LASIX) injection 40 mg (40 mg Intravenous Given 12/24/19 2310)  LORazepam (ATIVAN) injection 0.5 mg (0.5 mg Intravenous Given 12/25/19 0036)    ED Course  I have reviewed the triage vital signs and the nursing notes.  Pertinent labs & imaging results that were available during my care of the patient were reviewed by me and considered in my medical decision making (see chart for details).  Clinical Course as of Dec 25 430  Mon Dec 24, 2019  2335 10/27/19 echo with EF 30-35%   [LM]  [id number] 86TX male with complaint of SHOB and diaphoresis today. On exam, appears uncomfortable, would not participate in his history of care until he was given ice chips, then participates minimally. Patient is tachypenic, diaphoretic, mild wheezing throughout, no significant lower extremity edema. Denies CP and recent cocaine use. No relief of symptoms with albuterol nebulizer at home or with Solumedrol 125mg  given by EMS.  EKG with flipped t-waves laterally, changed from prior EKG, reviewed with Dr. Ashok Cordia, ER attending. Patient was given IV Lasix and nitro drip for his hypertension and likely acute CHF exacerbation. Also given duoned. Plan to admit once labs available.    [LM]  Tue Dec 25, 2019  0103 Patient was given Ativan, now more sleepy, assisted back into bed (previously seated on side of bed). Discussed with Dr. Roxanne Mins with concern for increased respiratory effort, plan is bipap and admission.  Patient has had 2L urine output. Continues to ask for ice chips, has a dry cough. Concern for somnolence from Co2  retention vs from the ativan however was given 0.5mg  of Ativan and doubt the somnolence is from the ativan.  BNP elevated at 1530, previously 1823. Troponin elevated at 83, delta pending. UDS positive for cocaine. CXR shows stable exam (compared to prior CHF exacerbation) unchanged cardiomegaly and central pulmonary vascular congestion).    [LM]  0116 Case discussed with hospitalist (unassigned) who will consult for admission.    [LM]  [id number] Patient with increasing somnolence, does not arouse to sternal rub.  ABG obtained with pH of 7.635, PCO2 19.3, PO2 22.  Discussed with Dr. Roxanne Mins, patient is satting well at 96%, plan to monitor.  Case discussed with Dr. Tonie Griffith, [facility], requests consult to critical care.  Bipap not started due to patient not awake/unable to protect airway potentially.    [LM]  [phone] Case discussed with Dr. Oletta Darter with critical care, will send someone to see the patient.    [LM]    Clinical Course User Index [LM] Roque Lias   MDM Rules/Calculators/A&P                          Final Clinical Impression(s) / ED Diagnoses Final diagnoses:  Acute on chronic congestive heart failure, unspecified heart failure type ([place])  Hypertension, unspecified type    Rx / DC Orders ED Discharge Orders    None       Tacy Learn, PA-C 12/25/19 0210    Tacy Learn, PA-C 12/25/19 [id number]    Lajean Saver, MD 12/25/19 [phone]

## 2019-12-24 NOTE — ED Triage Notes (Signed)
Pt came in [facility] form home with the c/o of [facility] that has been going on all day. Pt has a hx of CHF. Pt does not wear o2 at home but is requiring 4LNC today. Per EMS pt is using accessory muscles to breathe and noted wheezing and rales in lower lung bases. Per Pt he has never used CPAP and breathing treatments do not seem to help.  EMS gave 125SoluMed in route. 18GLHand

## 2019-12-25 ENCOUNTER — Inpatient Hospital Stay (HOSPITAL_COMMUNITY): Payer: Medicaid Other

## 2019-12-25 ENCOUNTER — Encounter (HOSPITAL_COMMUNITY): Payer: Self-pay | Admitting: Family Medicine

## 2019-12-25 DIAGNOSIS — Z7984 Long term (current) use of oral hypoglycemic drugs: Secondary | ICD-10-CM | POA: Diagnosis not present

## 2019-12-25 DIAGNOSIS — F17210 Nicotine dependence, cigarettes, uncomplicated: Secondary | ICD-10-CM | POA: Diagnosis present

## 2019-12-25 DIAGNOSIS — Z7982 Long term (current) use of aspirin: Secondary | ICD-10-CM | POA: Diagnosis not present

## 2019-12-25 DIAGNOSIS — E1122 Type 2 diabetes mellitus with diabetic chronic kidney disease: Secondary | ICD-10-CM | POA: Diagnosis present

## 2019-12-25 DIAGNOSIS — R7989 Other specified abnormal findings of blood chemistry: Secondary | ICD-10-CM | POA: Diagnosis present

## 2019-12-25 DIAGNOSIS — J9602 Acute respiratory failure with hypercapnia: Secondary | ICD-10-CM

## 2019-12-25 DIAGNOSIS — J9620 Acute and chronic respiratory failure, unspecified whether with hypoxia or hypercapnia: Secondary | ICD-10-CM

## 2019-12-25 DIAGNOSIS — J441 Chronic obstructive pulmonary disease with (acute) exacerbation: Secondary | ICD-10-CM | POA: Diagnosis present

## 2019-12-25 DIAGNOSIS — I517 Cardiomegaly: Secondary | ICD-10-CM | POA: Diagnosis not present

## 2019-12-25 DIAGNOSIS — F1210 Cannabis abuse, uncomplicated: Secondary | ICD-10-CM | POA: Diagnosis present

## 2019-12-25 DIAGNOSIS — I161 Hypertensive emergency: Secondary | ICD-10-CM | POA: Diagnosis present

## 2019-12-25 DIAGNOSIS — J9621 Acute and chronic respiratory failure with hypoxia: Secondary | ICD-10-CM | POA: Diagnosis present

## 2019-12-25 DIAGNOSIS — R61 Generalized hyperhidrosis: Secondary | ICD-10-CM | POA: Diagnosis present

## 2019-12-25 DIAGNOSIS — F1410 Cocaine abuse, uncomplicated: Secondary | ICD-10-CM | POA: Diagnosis present

## 2019-12-25 DIAGNOSIS — Z79899 Other long term (current) drug therapy: Secondary | ICD-10-CM | POA: Diagnosis not present

## 2019-12-25 DIAGNOSIS — N183 Chronic kidney disease, stage 3 unspecified: Secondary | ICD-10-CM

## 2019-12-25 DIAGNOSIS — I130 Hypertensive heart and chronic kidney disease with heart failure and stage 1 through stage 4 chronic kidney disease, or unspecified chronic kidney disease: Secondary | ICD-10-CM | POA: Diagnosis present

## 2019-12-25 DIAGNOSIS — E782 Mixed hyperlipidemia: Secondary | ICD-10-CM | POA: Diagnosis present

## 2019-12-25 DIAGNOSIS — Z20822 Contact with and (suspected) exposure to covid-19: Secondary | ICD-10-CM | POA: Diagnosis not present

## 2019-12-25 DIAGNOSIS — I5043 Acute on chronic combined systolic (congestive) and diastolic (congestive) heart failure: Secondary | ICD-10-CM | POA: Diagnosis not present

## 2019-12-25 DIAGNOSIS — K219 Gastro-esophageal reflux disease without esophagitis: Secondary | ICD-10-CM | POA: Diagnosis present

## 2019-12-25 DIAGNOSIS — M109 Gout, unspecified: Secondary | ICD-10-CM | POA: Diagnosis present

## 2019-12-25 DIAGNOSIS — I509 Heart failure, unspecified: Secondary | ICD-10-CM | POA: Diagnosis not present

## 2019-12-25 DIAGNOSIS — F329 Major depressive disorder, single episode, unspecified: Secondary | ICD-10-CM | POA: Diagnosis present

## 2019-12-25 DIAGNOSIS — J9622 Acute and chronic respiratory failure with hypercapnia: Secondary | ICD-10-CM | POA: Diagnosis present

## 2019-12-25 DIAGNOSIS — Z8249 Family history of ischemic heart disease and other diseases of the circulatory system: Secondary | ICD-10-CM | POA: Diagnosis not present

## 2019-12-25 DIAGNOSIS — Z7951 Long term (current) use of inhaled steroids: Secondary | ICD-10-CM | POA: Diagnosis not present

## 2019-12-25 DIAGNOSIS — J811 Chronic pulmonary edema: Secondary | ICD-10-CM | POA: Diagnosis not present

## 2019-12-25 DIAGNOSIS — J449 Chronic obstructive pulmonary disease, unspecified: Secondary | ICD-10-CM | POA: Diagnosis not present

## 2019-12-25 DIAGNOSIS — N1832 Chronic kidney disease, stage 3b: Secondary | ICD-10-CM | POA: Diagnosis present

## 2019-12-25 DIAGNOSIS — I110 Hypertensive heart disease with heart failure: Secondary | ICD-10-CM | POA: Diagnosis not present

## 2019-12-25 DIAGNOSIS — R0902 Hypoxemia: Secondary | ICD-10-CM | POA: Diagnosis not present

## 2019-12-25 DIAGNOSIS — I2510 Atherosclerotic heart disease of native coronary artery without angina pectoris: Secondary | ICD-10-CM | POA: Diagnosis present

## 2019-12-25 DIAGNOSIS — R0602 Shortness of breath: Secondary | ICD-10-CM | POA: Diagnosis present

## 2019-12-25 LAB — HIV ANTIBODY (ROUTINE TESTING W REFLEX): HIV Screen 4th Generation wRfx: NONREACTIVE

## 2019-12-25 LAB — CBC
HCT: 52.3 % — ABNORMAL HIGH (ref 39.0–52.0)
Hemoglobin: 16.7 g/dL (ref 13.0–17.0)
MCH: 29.2 pg (ref 26.0–34.0)
MCHC: 31.9 g/dL (ref 30.0–36.0)
MCV: 91.4 fL (ref 80.0–100.0)
Platelets: 196 10*3/uL (ref 150–400)
RBC: 5.72 MIL/uL (ref 4.22–5.81)
RDW: 15.9 % — ABNORMAL HIGH (ref 11.5–15.5)
WBC: 7.9 10*3/uL (ref 4.0–10.5)
nRBC: 0 % (ref 0.0–0.2)

## 2019-12-25 LAB — ECHOCARDIOGRAM LIMITED
Height: 66 in
Weight: 4349.23 oz

## 2019-12-25 LAB — I-STAT ARTERIAL BLOOD GAS, ED
Acid-base deficit: 1 mmol/L (ref 0.0–2.0)
Bicarbonate: 27 mmol/L (ref 20.0–28.0)
Calcium, Ion: 1.28 mmol/L (ref 1.15–1.40)
HCT: 49 % (ref 39.0–52.0)
Hemoglobin: 16.7 g/dL (ref 13.0–17.0)
O2 Saturation: 96 %
Patient temperature: 13
Potassium: 4.1 mmol/L (ref 3.5–5.1)
Sodium: 142 mmol/L (ref 135–145)
TCO2: 29 mmol/L (ref 22–32)
pCO2 arterial: 19.3 mmHg — CL (ref 32.0–48.0)
pH, Arterial: 7.635 (ref 7.350–7.450)
pO2, Arterial: 22 mmHg — CL (ref 83.0–108.0)

## 2019-12-25 LAB — TROPONIN I (HIGH SENSITIVITY)
Troponin I (High Sensitivity): 44 ng/L — ABNORMAL HIGH (ref <18)
Troponin I (High Sensitivity): 46 ng/L — ABNORMAL HIGH (ref <18)
Troponin I (High Sensitivity): 78 ng/L — ABNORMAL HIGH (ref <18)
Troponin I (High Sensitivity): 83 ng/L — ABNORMAL HIGH (ref <18)

## 2019-12-25 LAB — RAPID URINE DRUG SCREEN, HOSP PERFORMED
Amphetamines: NOT DETECTED
Barbiturates: NOT DETECTED
Benzodiazepines: NOT DETECTED
Cocaine: POSITIVE — AB
Opiates: NOT DETECTED
Tetrahydrocannabinol: NOT DETECTED

## 2019-12-25 LAB — BASIC METABOLIC PANEL
Anion gap: 8 (ref 5–15)
BUN: 30 mg/dL — ABNORMAL HIGH (ref 6–20)
CO2: 24 mmol/L (ref 22–32)
Calcium: 8.5 mg/dL — ABNORMAL LOW (ref 8.9–10.3)
Chloride: 108 mmol/L (ref 98–111)
Creatinine, Ser: 2.07 mg/dL — ABNORMAL HIGH (ref 0.61–1.24)
GFR calc Af Amer: 40 mL/min — ABNORMAL LOW (ref >60)
GFR calc non Af Amer: 35 mL/min — ABNORMAL LOW (ref >60)
Glucose, Bld: 73 mg/dL (ref 70–99)
Potassium: 3.9 mmol/L (ref 3.5–5.1)
Sodium: 140 mmol/L (ref 135–145)

## 2019-12-25 LAB — BRAIN NATRIURETIC PEPTIDE: B Natriuretic Peptide: 1530.4 pg/mL — ABNORMAL HIGH (ref 0.0–100.0)

## 2019-12-25 LAB — LIPID PANEL
Cholesterol: 176 mg/dL (ref 0–200)
HDL: 37 mg/dL — ABNORMAL LOW (ref >40)
LDL Cholesterol: 121 mg/dL — ABNORMAL HIGH (ref 0–99)
Total CHOL/HDL Ratio: 4.8 RATIO
Triglycerides: 91 mg/dL (ref <150)
VLDL: 18 mg/dL (ref 0–40)

## 2019-12-25 LAB — SARS CORONAVIRUS 2 BY RT PCR (HOSPITAL ORDER, PERFORMED IN ~~LOC~~ HOSPITAL LAB): SARS Coronavirus 2: NEGATIVE

## 2019-12-25 LAB — CREATININE, SERUM
Creatinine, Ser: 1.98 mg/dL — ABNORMAL HIGH (ref 0.61–1.24)
GFR calc Af Amer: 42 mL/min — ABNORMAL LOW (ref >60)
GFR calc non Af Amer: 36 mL/min — ABNORMAL LOW (ref >60)

## 2019-12-25 LAB — HEMOGLOBIN A1C
Hgb A1c MFr Bld: 6.8 % — ABNORMAL HIGH (ref 4.8–5.6)
Mean Plasma Glucose: 148.46 mg/dL

## 2019-12-25 LAB — GLUCOSE, CAPILLARY
Glucose-Capillary: 138 mg/dL — ABNORMAL HIGH (ref 70–99)
Glucose-Capillary: 151 mg/dL — ABNORMAL HIGH (ref 70–99)
Glucose-Capillary: 146 mg/dL — ABNORMAL HIGH (ref 70–99)
Glucose-Capillary: 167 mg/dL — ABNORMAL HIGH (ref 70–99)
Glucose-Capillary: 172 mg/dL — ABNORMAL HIGH (ref 70–99)
Glucose-Capillary: 165 mg/dL — ABNORMAL HIGH (ref 70–99)
Glucose-Capillary: 209 mg/dL — ABNORMAL HIGH (ref 70–99)

## 2019-12-25 LAB — MRSA PCR SCREENING: MRSA by PCR: NEGATIVE

## 2019-12-25 LAB — MAGNESIUM: Magnesium: 1.9 mg/dL (ref 1.7–2.4)

## 2019-12-25 MED ORDER — CARVEDILOL 25 MG PO TABS
25.0000 mg | ORAL_TABLET | Freq: Two times a day (BID) | ORAL | Status: DC
  Administered 2019-12-25 – 2019-12-27 (×4): 25 mg via ORAL
  Filled 2019-12-25 (×4): qty 1

## 2019-12-25 MED ORDER — ENOXAPARIN SODIUM 40 MG/0.4ML ~~LOC~~ SOLN: 40.0000 mg | SUBCUTANEOUS | Status: DC

## 2019-12-25 MED ORDER — SODIUM CHLORIDE 0.9% FLUSH
3.0000 mL | Freq: Two times a day (BID) | INTRAVENOUS | Status: DC
  Administered 2019-12-25 – 2019-12-27 (×5): 3 mL via INTRAVENOUS

## 2019-12-25 MED ORDER — POLYETHYLENE GLYCOL 3350 17 G PO PACK: 17.0000 g | PACK | Freq: Every day | ORAL | Status: DC | PRN

## 2019-12-25 MED ORDER — INSULIN ASPART 100 UNIT/ML ~~LOC~~ SOLN
0.0000 [IU] | Freq: Three times a day (TID) | SUBCUTANEOUS | Status: DC
  Administered 2019-12-25: 3 [IU] via SUBCUTANEOUS
  Administered 2019-12-25 (×2): 4 [IU] via SUBCUTANEOUS
  Administered 2019-12-26 (×2): 3 [IU] via SUBCUTANEOUS
  Administered 2019-12-26: 4 [IU] via SUBCUTANEOUS
  Administered 2019-12-27: 3 [IU] via SUBCUTANEOUS

## 2019-12-25 MED ORDER — ENOXAPARIN SODIUM 40 MG/0.4ML ~~LOC~~ SOLN
40.0000 mg | SUBCUTANEOUS | Status: DC
  Administered 2019-12-25 – 2019-12-26 (×2): 40 mg via SUBCUTANEOUS
  Filled 2019-12-25 (×2): qty 0.4

## 2019-12-25 MED ORDER — DOCUSATE SODIUM 100 MG PO CAPS: 100.0000 mg | ORAL_CAPSULE | Freq: Two times a day (BID) | ORAL | Status: DC | PRN

## 2019-12-25 MED ORDER — MOMETASONE FURO-FORMOTEROL FUM 200-5 MCG/ACT IN AERO
2.0000 | INHALATION_SPRAY | Freq: Two times a day (BID) | RESPIRATORY_TRACT | Status: DC
  Administered 2019-12-27: 2 via RESPIRATORY_TRACT
  Filled 2019-12-25 (×2): qty 8.8

## 2019-12-25 MED ORDER — NICARDIPINE HCL IN NACL 20-0.86 MG/200ML-% IV SOLN
3.0000 mg/h | INTRAVENOUS | Status: DC
  Administered 2019-12-25: 7.5 mg/h via INTRAVENOUS
  Administered 2019-12-25: 3 mg/h via INTRAVENOUS
  Filled 2019-12-25 (×3): qty 200

## 2019-12-25 MED ORDER — ISOSORB DINITRATE-HYDRALAZINE 20-37.5 MG PO TABS
1.0000 | ORAL_TABLET | Freq: Three times a day (TID) | ORAL | Status: DC
  Administered 2019-12-25 – 2019-12-27 (×7): 1 via ORAL
  Filled 2019-12-25 (×7): qty 1

## 2019-12-25 MED ORDER — SODIUM CHLORIDE 0.9 % IV SOLN: 250.0000 mL | INTRAVENOUS | Status: DC | PRN

## 2019-12-25 MED ORDER — CHLORHEXIDINE GLUCONATE CLOTH 2 % EX PADS
6.0000 | MEDICATED_PAD | Freq: Every day | CUTANEOUS | Status: DC
  Administered 2019-12-25 – 2019-12-26 (×2): 6 via TOPICAL

## 2019-12-25 MED ORDER — ACETAMINOPHEN 325 MG PO TABS: 650.0000 mg | ORAL_TABLET | ORAL | Status: DC | PRN

## 2019-12-25 MED ORDER — METHYLPREDNISOLONE SODIUM SUCC 40 MG IJ SOLR
40.0000 mg | Freq: Two times a day (BID) | INTRAMUSCULAR | Status: DC
  Administered 2019-12-25 – 2019-12-26 (×2): 40 mg via INTRAVENOUS
  Filled 2019-12-25 (×2): qty 1

## 2019-12-25 MED ORDER — ASPIRIN 81 MG PO CHEW
81.0000 mg | CHEWABLE_TABLET | Freq: Every day | ORAL | Status: DC
  Administered 2019-12-25 – 2019-12-27 (×3): 81 mg via ORAL
  Filled 2019-12-25 (×3): qty 1

## 2019-12-25 MED ORDER — FUROSEMIDE 10 MG/ML IJ SOLN
60.0000 mg | Freq: Two times a day (BID) | INTRAMUSCULAR | Status: AC
  Administered 2019-12-25 – 2019-12-26 (×3): 60 mg via INTRAVENOUS
  Filled 2019-12-25 (×3): qty 6

## 2019-12-25 MED ORDER — IPRATROPIUM-ALBUTEROL 0.5-2.5 (3) MG/3ML IN SOLN
3.0000 mL | Freq: Four times a day (QID) | RESPIRATORY_TRACT | Status: DC | PRN
  Filled 2019-12-25: qty 3

## 2019-12-25 MED ORDER — ONDANSETRON HCL 4 MG/2ML IJ SOLN: 4.0000 mg | Freq: Four times a day (QID) | INTRAMUSCULAR | Status: DC | PRN

## 2019-12-25 MED ORDER — POTASSIUM CHLORIDE CRYS ER 10 MEQ PO TBCR
10.0000 meq | EXTENDED_RELEASE_TABLET | Freq: Two times a day (BID) | ORAL | Status: DC
  Administered 2019-12-25 – 2019-12-27 (×5): 10 meq via ORAL
  Filled 2019-12-25 (×5): qty 1

## 2019-12-25 MED ORDER — LORAZEPAM 2 MG/ML IJ SOLN
0.5000 mg | Freq: Once | INTRAMUSCULAR | Status: AC
  Administered 2019-12-25: 0.5 mg via INTRAVENOUS
  Filled 2019-12-25: qty 1

## 2019-12-25 MED ORDER — SODIUM CHLORIDE 0.9 % IV SOLN
500.0000 mg | INTRAVENOUS | Status: DC
  Administered 2019-12-25: 500 mg via INTRAVENOUS
  Filled 2019-12-25 (×3): qty 500

## 2019-12-25 MED ORDER — SACUBITRIL-VALSARTAN 24-26 MG PO TABS
1.0000 | ORAL_TABLET | Freq: Two times a day (BID) | ORAL | Status: DC
  Administered 2019-12-25 – 2019-12-26 (×4): 1 via ORAL
  Filled 2019-12-25 (×6): qty 1

## 2019-12-25 MED ORDER — ATORVASTATIN CALCIUM 40 MG PO TABS
40.0000 mg | ORAL_TABLET | Freq: Every day | ORAL | Status: DC
  Administered 2019-12-25 – 2019-12-26 (×2): 40 mg via ORAL
  Filled 2019-12-25 (×2): qty 1

## 2019-12-25 MED ORDER — SODIUM CHLORIDE 0.9% FLUSH: 3.0000 mL | INTRAVENOUS | Status: DC | PRN

## 2019-12-25 MED ORDER — IPRATROPIUM-ALBUTEROL 0.5-2.5 (3) MG/3ML IN SOLN
3.0000 mL | RESPIRATORY_TRACT | Status: DC
  Administered 2019-12-25 (×5): 3 mL via RESPIRATORY_TRACT
  Filled 2019-12-25 (×6): qty 3

## 2019-12-25 NOTE — ED Notes (Signed)
RN gave update to family.

## 2019-12-25 NOTE — Progress Notes (Signed)
NAME:  Chris [name], MRN:  [id number], DOB:  [birth year], LOS: 0 ADMISSION DATE:  12/24/2019, CONSULTATION DATE:  12/25/19 REFERRING MD:  , CHIEF COMPLAINT:  Shortness of Breath  Brief History   Chris [name] is a 59 y.o. male with a PMH of systolic and diastolic HF (last EF 44-01%), CAD, HTN, DMT2, COPD, and cocaine and marijuana abuse. He presented to the [facility] ED after one day of progressive SOB that prompted him to call EMS.   History of present illness   Chris [name] is a 59 y.o. male with medical history significant for CHF, CAD, HTN, DMT2, COPD, cocaine and marijuana use who presents by EMS with complaint of SOB. Reportedly had worsening SOB throughout the day to the point that EMS was called to his house.  Reportedly he does not  use oxygen at home was found to be hypoxic with O2 sat in the mid 80s when EMS arrived.  He did use his albuterol inhaler but did not get relief of his shortness of breath.  He was placed on oxygen by nasal cannula which he continues on.  Patient has been did not to answer questions provide history according to ER provider, he was agitated earlier was given 0.5 mg of Ativan IV he has now sleeping and difficult to arouse.  EMS reported that patient was very diaphoretic when they first evaluated him.  Patient did not have any complaints of chest pain to the emergency room provider after arrival.  Patient has elevated BNP and troponin which are not higher than his previous levels.  He continues to have cardiomegaly and pulmonary vascular congestion on chest x-ray.  He was given Lasix in the emergency room with diuresis of 2 L of urine.   Patient is very somnolent and will open his eyes to painful stimuli but only mumbles brief answer and then goes back to sleep.  Past Medical History   Past Medical History:  Diagnosis Date  . CHF (congestive heart failure) ([place])   . Chronic kidney disease   . COPD (chronic obstructive pulmonary disease) ([place])   . Coronary artery  disease   . Depression   . Diabetes mellitus without complication ([place])   . GERD (gastroesophageal reflux disease)   . Gout   . Hypertension   . Influenza A with respiratory manifestations   . Mental disorder     Significant Hospital Events   7/13 > Admitted to 19M  Consults:    Procedures:    Significant Diagnostic Tests:  7/13 CXR >> Stable cardiomegaly with central pulmonary vascular congestion  Micro Data:  7/13 > MRSA negative  Antimicrobials:  Azithromycin 7/13 -   Interim history/subjective:  Patient resting in bed, somnolent but responsive to questions with stimulation. Denies CP or SOB.   Objective   Blood pressure (!) 143/94, pulse 78, temperature (!) 97.3 F (36.3 C), temperature source Oral, resp. rate (!) 33, height 5\' 6"  (1.676 m), weight 123.3 kg, SpO2 93 %.        Intake/Output Summary (Last 24 hours) at 12/25/2019 0902 Last data filed at 12/25/2019 0800 Gross per 24 hour  Intake 143.4 ml  Output 3350 ml  Net -3206.6 ml   Filed Weights   12/24/19 2215 12/25/19 0624  Weight: 127 kg 123.3 kg    Examination: General: Drowsy but responsive to questions. HENT: Normocephalic, atraumatic, EOMI. Lungs: Diffuse expiratory wheezes. Normal work of breathing, no accessory muscle use. No crackles. Normal chest wall excursion. Cardiovascular: RRR,  NS1S2. Extremities warm.  Abdomen: Soft, non-tender, non-distended. Normoactive bowel sounds.  Extremities: Warm. Trace LE edema.  Neuro: Oriented to person, place, time, and situation. Moves all extremities spontaneously.    Resolved Hospital Problem list     Assessment & Plan:  Acute on chronic systolic/diastolic heart failure Responded well to IV Lasix and is net negative 3.3 L overnight. Respiratory status has improved but he is still requiring 4 L [place], although not currently endorsing dyspnea. Troponin down-trending, low suspicion for ischemic event.  Most recent echo May 2021 with EF 30-35%. Consider  repeat echo to assess for acute worsening of LV function. Plan -IV Lasix 60 mg BID -Start Entresto 24-26. Monitor BP and Cr -Continue home Bidil, Coreg -Strict I/O's, daily weights -+/- echo  HTN Persistently hypertensive overnight, with systolic BP ranged from 093-818. BP slightly improved this morning to 146/88. -Nicardipine drip at 7.5 mg/hr this AM -Continue home Coreg, Bidil -Start Entresto  HLD -continue home atorvastatin 40 mg daily -f/u lipid panel   CKD Cr 1.98, which appears to be roughly his baseline. -continue to monitor with daily BMPs in setting of Lasix and addition of Entresto  COPD -Azithromycin to cover for CAP -Solumedrol 40 mg IV q12 -Duoneb q6 PRN  -Dulera in place of home Symbicort -SpO2 goal >88  DMT2 A1C 6.8. -sliding scale insulin  Substance abuse, cocaine and marijuana -involve social work for counseling on drug cessation   Best practice:  Diet: NPO, sips with meds  Pain/Anxiety/Delirium protocol (if indicated): per protocol DVT prophylaxis: none GI prophylaxis: none Glucose control: Novolog Mobility: ambulatory Code Status: Full Family Communication: N/A Disposition: ICU  Labs   CBC: Recent Labs  Lab 12/24/19 2220 12/25/19 0239 12/25/19 0632  WBC 10.0  --  7.9  HGB 15.1 16.7 16.7  HCT 46.9 49.0 52.3*  MCV 91.4  --  91.4  PLT 190  --  299    Basic Metabolic Panel: Recent Labs  Lab 12/24/19 2220 12/25/19 0239 12/25/19 0522  NA 140 142  --   K 3.9 4.1  --   CL 108  --   --   CO2 24  --   --   GLUCOSE 73  --   --   BUN 30*  --   --   CREATININE 2.07*  --  1.98*  CALCIUM 8.5*  --   --    GFR: Estimated Creatinine Clearance: 51 mL/min (A) (by C-G formula based on SCr of 1.98 mg/dL (H)). Recent Labs  Lab 12/24/19 2220 12/25/19 0632  WBC 10.0 7.9    Liver Function Tests: No results for input(s): AST, ALT, ALKPHOS, BILITOT, PROT, ALBUMIN in the last 168 hours. No results for input(s): LIPASE, AMYLASE in the last  168 hours. No results for input(s): AMMONIA in the last 168 hours.  ABG    Component Value Date/Time   PHART 7.635 (HH) 12/25/2019 0239   PCO2ART 19.3 (LL) 12/25/2019 0239   PO2ART 22 (LL) 12/25/2019 0239   HCO3 27.0 12/25/2019 0239   TCO2 29 12/25/2019 0239   ACIDBASEDEF 1.0 12/25/2019 0239   O2SAT 96.0 12/25/2019 0239     Coagulation Profile: No results for input(s): INR, PROTIME in the last 168 hours.  Cardiac Enzymes: No results for input(s): CKTOTAL, CKMB, CKMBINDEX, TROPONINI in the last 168 hours.  HbA1C: Hgb A1c MFr Bld  Date/Time Value Ref Range Status  12/25/2019 06:32 AM 6.8 (H) 4.8 - 5.6 % Final    Comment:    (NOTE)  Pre diabetes:          5.7%-6.4%  Diabetes:              >6.4%  Glycemic control for   <7.0% adults with diabetes   11/07/2019 09:18 AM 6.5 (H) 4.8 - 5.6 % Final    Comment:             Prediabetes: 5.7 - 6.4          Diabetes: >6.4          Glycemic control for adults with diabetes: <7.0     CBG: Recent Labs  Lab 12/25/19 0621 12/25/19 0844  GLUCAP 138* 151*    Review of Systems:   Negative unless otherwise stated in HPI.  Past Medical History  He,  has a past medical history of CHF (congestive heart failure) ([place]), Chronic kidney disease, COPD (chronic obstructive pulmonary disease) ([place]), Coronary artery disease, Depression, Diabetes mellitus without complication ([place]), GERD (gastroesophageal reflux disease), Gout, Hypertension, Influenza A with respiratory manifestations, and Mental disorder.   Surgical History    Past Surgical History:  Procedure Laterality Date  . ANKLE SURGERY    . CARDIAC CATHETERIZATION    . SHOULDER SURGERY       Social History   reports that he has been smoking cigarettes. He has a 20.00 pack-year smoking history. He has never used smokeless tobacco. He reports current drug use. Frequency: 21.00 times per week. Drugs: Marijuana and Cocaine. He reports that he does not drink alcohol.   Family  History   His family history includes Heart disease in his father.   Allergies No Known Allergies   Home Medications  Prior to Admission medications   Medication Sig Start Date End Date Taking? Authorizing Provider  albuterol (VENTOLIN HFA) 108 (90 Base) MCG/ACT inhaler Inhale 2 puffs into the lungs every 6 (six) hours as needed for wheezing or shortness of breath. 09/11/19  Yes Mayers, Cari S, PA-C  aspirin EC 81 MG EC tablet Take 1 tablet (81 mg total) by mouth daily. 10/30/19  Yes Swayze, Ava, DO  atorvastatin (LIPITOR) 40 MG tablet Take 1 tablet (40 mg total) by mouth daily at 6 PM. 07/09/19  Yes [name], NP  budesonide-formoterol (SYMBICORT) 160-4.5 MCG/ACT inhaler Inhale 2 puffs into the lungs 2 (two) times daily. 09/11/19  Yes Mayers, Cari S, PA-C  carvedilol (COREG) 25 MG tablet Take 1 tablet (25 mg total) by mouth 2 (two) times daily with a meal. 11/01/19  Yes [name], MD  fluticasone (FLONASE) 50 MCG/ACT nasal spray Place 1 spray into both nostrils daily as needed for allergies or rhinitis. 09/11/19  Yes Mayers, Cari S, PA-C  furosemide (LASIX) 40 MG tablet Take 1.5 tablets (60 mg total) by mouth daily. 11/01/19  Yes [name], MD  Accu-Chek Softclix Lancets lancets Use as instructed. Inject into the skin once daily 11/13/19   [name], NP  benzonatate (TESSALON) 100 MG capsule Take 2 capsules (200 mg total) by mouth 3 (three) times daily as needed. Patient not taking: Reported on 12/24/2019 10/17/19   [name], PA-C  Blood Pressure Monitor DEVI Please provide patient with insurance approved blood pressure monitor 07/09/19   [name], NP  glipiZIDE (GLUCOTROL) 5 MG tablet Take 0.5 tablets (2.5 mg total) by mouth 2 (two) times daily before a meal. 11/13/19 12/13/19  [name], NP  glucose blood (ACCU-CHEK GUIDE) test strip Use as instructed. Check blood glucose by  fingerstick once per day. 11/13/19   [name], NP  isosorbide-hydrALAZINE  (BIDIL) 20-37.5 MG tablet Take 1 tablet by mouth 3 (three) times daily. 11/01/19   [name], MD  Lancet Devices (LANCING DEVICE) MISC 1 Device by Does not apply route daily. 11/13/19   [name], NP  montelukast (SINGULAIR) 10 MG tablet Take 1 tablet (10 mg total) by mouth at bedtime. 09/11/19 03/09/20  Mayers, Cari S, PA-C  nicotine (NICODERM CQ - DOSED IN MG/24 HOURS) 21 mg/24hr patch Place 1 patch (21 mg total) onto the skin daily. 09/11/19   Mayers, Cari S, PA-C  traZODone (DESYREL) 50 MG tablet Take 0.5-1 tablets (25-50 mg total) by mouth at bedtime as needed for sleep. 10/17/19   [name], PA-C     Critical care time:       Riccardo Dubin, MS4

## 2019-12-25 NOTE — Progress Notes (Signed)
Patient received from ED on stretcher. Patient being verbally abusive to RN , [place] bringing him up from ED. Yelling about him touching him. Attempts by staff to keep patient calm and redirect him away from that RN. Patient wiped down with CHG wipes, placed on monitor, and skin assessed. Adjustments made to cardene gtt. Patient demanding water. Explained multiple times that patient is NPO d/t oxygen eneds. Pt falling asleep in bed. Call light within reach and bed alarm set.   Milford Cage, RN

## 2019-12-25 NOTE — Consult Note (Signed)
NAME:  Chris [name], MRN:  [id number], DOB:  Nov 14, 1960, LOS: 0 ADMISSION DATE:  12/24/2019, CONSULTATION DATE:  12/25/2019 REFERRING MD:  Hospitalists, CHIEF COMPLAINT: Respiratory failure  Brief History   59 yo with history of CHF presents with hypercapnic, hypoxemic respiratory failure  History of present illness   Patient is a 59 year old male with history of CHF (last ejection fraction in May was 35%) chronic kidney disease with baseline creatinine of 2, COPD, cocaine abuse (urine drug screen positive for Cocaine on this evaluation) and daily alcohol use who presents to the emergency room with worsening shortness of breath.  On evaluating the patient in the emergency room he is on 4 L nasal cannula with a pH of 7.3 PCO2 of 55 PO2 of 90 on 3 L nasal cannula.  Patient is easily arousable but then returns to sleep.  Patient's respiratory rate is 28.  EKG shows inverted T waves consistent with previous EKG.  Past Medical History   . Type 2 diabetes mellitus with stage 3 chronic kidney disease ([place]) 12/25/2019  . Acute on chronic combined systolic and diastolic CHF (congestive heart failure) ([place]) 10/26/2019  . Elevated troponin I level 10/26/2019  . Acute on chronic diastolic (congestive) heart failure ([place]) 10/26/2019  . History of gout 02/01/2019  . Seasonal allergic rhinitis due to pollen 02/01/2019  . Tobacco dependence 11/30/2018  . Microscopic hematuria 11/30/2018  . Depression 11/30/2018  . Difficulty controlling anger 11/30/2018  . COPD (chronic obstructive pulmonary disease) ([place])   . CKD (chronic kidney disease) stage 3, GFR 30-59 ml/min 08/10/2018  . Recurrent epistaxis 04/21/2018  . Mixed hyperlipidemia 07/28/2017  . Essential hypertension 07/28/2017  . Chronic systolic heart failure ([place]) 10/25/2014  . Cocaine abuse ([place]) 02/20/2013  . Cannabis abuse 02/20/2013  . Back pain, chronic      Significant Hospital Events   Admission  Consults:  PCCM  Procedures:    NA  Significant Diagnostic Tests:  NA  Micro Data:  NA  Antimicrobials:  NA  Interim history/subjective:  NA  Objective   Blood pressure (!) 177/113, pulse 73, temperature (!) 97.5 F (36.4 C), temperature source Oral, resp. rate (!) 38, height 5\' 6"  (1.676 m), weight 127 kg, SpO2 90 %.        Intake/Output Summary (Last 24 hours) at 12/25/2019 0520 Last data filed at 12/25/2019 2409 Gross per 24 hour  Intake --  Output 2800 ml  Net -2800 ml   Filed Weights   12/24/19 2215  Weight: 127 kg    Examination: General: Black male who is arousable and tachypnea HENT: Within normal limits Lungs: Coarse rhonchi bilaterally Cardiovascular: Regular Abdomen: Rotund benign Extremities: Within normal limits Neuro: Nonfocal GU: N/A  Resolved Hospital Problem list   NA  Assessment & Plan:  1.  Hypercapnic hypoxemic respiratory failure: Arterial blood gas shows hypercapnia with some hypoxemia without portion to the degree of hypoxemia.  There may be a significant component of COPD causing this.  I agree with hospitalist plans for diuresis we will continue this but will also treat as a COPD exacerbation with Solu-Medrol antibiotics and bronchodilators.  Patient may require initiation of BiPAP in the ICU setting.  2.  Hypertension: On my arrival to the emergency room the patient was on nitroglycerin but this is not controlling his blood pressure and is not significantly improved his clinical situation.  We will add Cardene possibly stop nitroglycerin.  3.  Chronic kidney disease: We will monitor  4.  History  of cocaine abuse urine tox screen positive  5.  History of recurrent congestive heart failure: Known ejection fraction of 35%.  Will reduce afterload as above and continue with current orders for diuresis.  Best practice:  Diet: N.p.o. for now Pain/Anxiety/Delirium protocol (if indicated): N/A VAP protocol (if indicated): N/A DVT prophylaxis: Lovenox GI prophylaxis:  N/A Glucose control: Sliding scale Mobility: Bedrest Code Status: Full Family Communication: Not available Disposition: To ICU for initial treatment possible initiation of BiPAP  Labs   CBC: Recent Labs  Lab 12/24/19 2220 12/25/19 0239  WBC 10.0  --   HGB 15.1 16.7  HCT 46.9 49.0  MCV 91.4  --   PLT 190  --     Basic Metabolic Panel: Recent Labs  Lab 12/24/19 2220 12/25/19 0239  NA 140 142  K 3.9 4.1  CL 108  --   CO2 24  --   GLUCOSE 73  --   BUN 30*  --   CREATININE 2.07*  --   CALCIUM 8.5*  --    GFR: Estimated Creatinine Clearance: 49.6 mL/min (A) (by C-G formula based on SCr of 2.07 mg/dL (H)). Recent Labs  Lab 12/24/19 2220  WBC 10.0    Liver Function Tests: No results for input(s): AST, ALT, ALKPHOS, BILITOT, PROT, ALBUMIN in the last 168 hours. No results for input(s): LIPASE, AMYLASE in the last 168 hours. No results for input(s): AMMONIA in the last 168 hours.  ABG    Component Value Date/Time   PHART 7.635 (HH) 12/25/2019 0239   PCO2ART 19.3 (LL) 12/25/2019 0239   PO2ART 22 (LL) 12/25/2019 0239   HCO3 27.0 12/25/2019 0239   TCO2 29 12/25/2019 0239   ACIDBASEDEF 1.0 12/25/2019 0239   O2SAT 96.0 12/25/2019 0239     Coagulation Profile: No results for input(s): INR, PROTIME in the last 168 hours.  Cardiac Enzymes: No results for input(s): CKTOTAL, CKMB, CKMBINDEX, TROPONINI in the last 168 hours.  HbA1C: Hgb A1c MFr Bld  Date/Time Value Ref Range Status  11/07/2019 09:18 AM 6.5 (H) 4.8 - 5.6 % Final    Comment:             Prediabetes: 5.7 - 6.4          Diabetes: >6.4          Glycemic control for adults with diabetes: <7.0     CBG: No results for input(s): GLUCAP in the last 168 hours.  Review of Systems:   Unable to obtain  Past Medical History  He,  has a past medical history of CHF (congestive heart failure) ([place]), Chronic kidney disease, COPD (chronic obstructive pulmonary disease) ([place]), Coronary artery disease,  Depression, Diabetes mellitus without complication ([place]), GERD (gastroesophageal reflux disease), Gout, Hypertension, Influenza A with respiratory manifestations, and Mental disorder.   Surgical History    Past Surgical History:  Procedure Laterality Date  . ANKLE SURGERY    . CARDIAC CATHETERIZATION    . SHOULDER SURGERY       Social History   reports that he has been smoking cigarettes. He has a 20.00 pack-year smoking history. He has never used smokeless tobacco. He reports current drug use. Frequency: 21.00 times per week. Drugs: Marijuana and Cocaine. He reports that he does not drink alcohol.   Family History   His family history includes Heart disease in his father.   Allergies No Known Allergies   Home Medications  Prior to Admission medications   Medication Sig Start Date End Date  Taking? Authorizing Provider  albuterol (VENTOLIN HFA) 108 (90 Base) MCG/ACT inhaler Inhale 2 puffs into the lungs every 6 (six) hours as needed for wheezing or shortness of breath. 09/11/19  Yes Mayers, Cari S, PA-C  aspirin EC 81 MG EC tablet Take 1 tablet (81 mg total) by mouth daily. 10/30/19  Yes Swayze, Ava, DO  atorvastatin (LIPITOR) 40 MG tablet Take 1 tablet (40 mg total) by mouth daily at 6 PM. 07/09/19  Yes [name], NP  budesonide-formoterol (SYMBICORT) 160-4.5 MCG/ACT inhaler Inhale 2 puffs into the lungs 2 (two) times daily. 09/11/19  Yes Mayers, Cari S, PA-C  carvedilol (COREG) 25 MG tablet Take 1 tablet (25 mg total) by mouth 2 (two) times daily with a meal. 11/01/19  Yes [name], MD  fluticasone (FLONASE) 50 MCG/ACT nasal spray Place 1 spray into both nostrils daily as needed for allergies or rhinitis. 09/11/19  Yes Mayers, Cari S, PA-C  furosemide (LASIX) 40 MG tablet Take 1.5 tablets (60 mg total) by mouth daily. 11/01/19  Yes [name], MD  Accu-Chek Softclix Lancets lancets Use as instructed. Inject into the skin once daily 11/13/19   [name], NP   benzonatate (TESSALON) 100 MG capsule Take 2 capsules (200 mg total) by mouth 3 (three) times daily as needed. Patient not taking: Reported on 12/24/2019 10/17/19   [name], PA-C  Blood Pressure Monitor DEVI Please provide patient with insurance approved blood pressure monitor 07/09/19   [name], NP  glipiZIDE (GLUCOTROL) 5 MG tablet Take 0.5 tablets (2.5 mg total) by mouth 2 (two) times daily before a meal. 11/13/19 12/13/19  [name], NP  glucose blood (ACCU-CHEK GUIDE) test strip Use as instructed. Check blood glucose by fingerstick once per day. 11/13/19   [name], NP  isosorbide-hydrALAZINE (BIDIL) 20-37.5 MG tablet Take 1 tablet by mouth 3 (three) times daily. 11/01/19   [name], MD  Lancet Devices (LANCING DEVICE) MISC 1 Device by Does not apply route daily. 11/13/19   [name], NP  montelukast (SINGULAIR) 10 MG tablet Take 1 tablet (10 mg total) by mouth at bedtime. 09/11/19 03/09/20  Mayers, Cari S, PA-C  nicotine (NICODERM CQ - DOSED IN MG/24 HOURS) 21 mg/24hr patch Place 1 patch (21 mg total) onto the skin daily. 09/11/19   Mayers, Cari S, PA-C  traZODone (DESYREL) 50 MG tablet Take 0.5-1 tablets (25-50 mg total) by mouth at bedtime as needed for sleep. 10/17/19   [name], PA-C     Critical care time: Over 35 minutes spent in bedside evaluation, chart review and critical care planning

## 2019-12-25 NOTE — ED Notes (Signed)
Pt refuses to lay down in the bed. Pt is becoming tired and and is falling asleep hanging over the bedside table. RN explained to the patient the risk of him sitting on the side of the bed, especially after this RN gave him 0.5mg IV Ativan. Pt continues to refuses to lay down.

## 2019-12-25 NOTE — Progress Notes (Signed)
Pt doing well off cardene gtt for >3hr after starting home meds.   Will place orders to transfer out of ICU at this time. TRH to reassume care tomorrow am. CCM will sign off at that time.

## 2019-12-25 NOTE — ED Notes (Signed)
[facility] d/c per MD Mariane Masters verbal orders.

## 2019-12-25 NOTE — ED Notes (Signed)
On Transport, RN attempted to save pt IV in his Right Hand by maneuvering the patients hand. The patient was asleep and became startled think the RN was trying to "hurt him". RN explained to the pt that RN was attempting to prevent his IV from coming out. Pt was increasingly agitated and began to curse at the RN. The pt made it to 52[facility] safely with the IV intact and the pt was yelling the entire time.

## 2019-12-25 NOTE — H&P (Signed)
History and Physical    Chris [name] ZOX:[id number] DOB: 30-Aug-1960 DOA: 12/24/2019  PCP: [name], MD   Patient coming from:    Home  Chief Complaint:   SOB  HPI: Chris [name] is a 59 y.o. male with medical history significant for CHF, CAD, HTN, DMT2, COPD, cocaine and marijuana use who presents by EMS with complaint of SOB. Reportedly had worsening SOB throughout the day to the point that EMS was called to his house.  Reportedly he does not  use oxygen at home was found to be hypoxic with O2 sat in the mid 80s when EMS arrived.  He did use his albuterol inhaler but did not get relief of his shortness of breath.  He was placed on oxygen by nasal cannula which he continues on.  Patient has been did not to answer questions provide history according to ER provider, he was agitated earlier was given 0.5 mg of Ativan IV he has now sleeping and difficult to arouse.  EMS reported that patient was very diaphoretic when they first evaluated him.  Patient did not have any complaints of chest pain to the emergency room provider after arrival.  Patient has elevated BNP and troponin which are not higher than his previous levels.  He continues to have cardiomegaly and pulmonary vascular congestion on chest x-ray.  He was given Lasix in the emergency room with diuresis of 2 L of urine.   Patient is very somnolent and will open his eyes to painful stimuli but only mumbles brief answer and then goes back to sleep.  ED Course: In the emergency room requires oxygen supplementation that he does not use at home to maintain O2 sat.  Urine drug screen is positive for cocaine.  BNP and troponin are elevated but not higher than previous values.  Clinically patient has CHF exacerbation.   Review of Systems:  Accurate review of systems cannot be obtained from the patient secondary to somnolence and patient will not answer questions when awoken.  Past Medical History:  Diagnosis Date  . CHF (congestive  heart failure) (HCC)   . Chronic kidney disease   . COPD (chronic obstructive pulmonary disease) (HCC)   . Coronary artery disease   . Depression   . Diabetes mellitus without complication (HCC)   . GERD (gastroesophageal reflux disease)   . Gout   . Hypertension   . Influenza A with respiratory manifestations   . Mental disorder     Past Surgical History:  Procedure Laterality Date  . ANKLE SURGERY    . CARDIAC CATHETERIZATION    . SHOULDER SURGERY      Social History  reports that he has been smoking cigarettes. He has a 20.00 pack-year smoking history. He has never used smokeless tobacco. He reports current drug use. Frequency: 21.00 times per week. Drugs: Marijuana and Cocaine. He reports that he does not drink alcohol.  No Known Allergies  Family History  Problem Relation Age of Onset  . Heart disease Father      Prior to Admission medications   Medication Sig Start Date End Date Taking? Authorizing Provider  albuterol (VENTOLIN HFA) 108 (90 Base) MCG/ACT inhaler Inhale 2 puffs into the lungs every 6 (six) hours as needed for wheezing or shortness of breath. 09/11/19  Yes Mayers, Cari S, PA-C  aspirin EC 81 MG EC tablet Take 1 tablet (81 mg total) by mouth daily. 10/30/19  Yes Swayze, Ava, DO  atorvastatin (LIPITOR) 40 MG tablet Take  1 tablet (40 mg total) by mouth daily at 6 PM. 07/09/19  Yes Claiborne Rigg, NP  budesonide-formoterol (SYMBICORT) 160-4.5 MCG/ACT inhaler Inhale 2 puffs into the lungs 2 (two) times daily. 09/11/19  Yes Mayers, Cari S, PA-C  carvedilol (COREG) 25 MG tablet Take 1 tablet (25 mg total) by mouth 2 (two) times daily with a meal. 11/01/19  Yes [name], MD  fluticasone (FLONASE) 50 MCG/ACT nasal spray Place 1 spray into both nostrils daily as needed for allergies or rhinitis. 09/11/19  Yes Mayers, Cari S, PA-C  furosemide (LASIX) 40 MG tablet Take 1.5 tablets (60 mg total) by mouth daily. 11/01/19  Yes [name], MD  Accu-Chek  Softclix Lancets lancets Use as instructed. Inject into the skin once daily 11/13/19   Claiborne Rigg, NP  benzonatate (TESSALON) 100 MG capsule Take 2 capsules (200 mg total) by mouth 3 (three) times daily as needed. Patient not taking: Reported on 12/24/2019 10/17/19   Anders Simmonds, PA-C  Blood Pressure Monitor DEVI Please provide patient with insurance approved blood pressure monitor 07/09/19   Claiborne Rigg, NP  glipiZIDE (GLUCOTROL) 5 MG tablet Take 0.5 tablets (2.5 mg total) by mouth 2 (two) times daily before a meal. 11/13/19 12/13/19  Claiborne Rigg, NP  glucose blood (ACCU-CHEK GUIDE) test strip Use as instructed. Check blood glucose by fingerstick once per day. 11/13/19   Claiborne Rigg, NP  isosorbide-hydrALAZINE (BIDIL) 20-37.5 MG tablet Take 1 tablet by mouth 3 (three) times daily. 11/01/19   [name], MD  Lancet Devices (LANCING DEVICE) MISC 1 Device by Does not apply route daily. 11/13/19   Claiborne Rigg, NP  montelukast (SINGULAIR) 10 MG tablet Take 1 tablet (10 mg total) by mouth at bedtime. 09/11/19 03/09/20  Mayers, Cari S, PA-C  nicotine (NICODERM CQ - DOSED IN MG/24 HOURS) 21 mg/24hr patch Place 1 patch (21 mg total) onto the skin daily. 09/11/19   Mayers, Cari S, PA-C  traZODone (DESYREL) 50 MG tablet Take 0.5-1 tablets (25-50 mg total) by mouth at bedtime as needed for sleep. 10/17/19   Anders Simmonds, PA-C    Physical Exam: Vitals:   12/25/19 0029 12/25/19 0058 12/25/19 0117 12/25/19 0147  BP: (!) 124/104 (!) 155/113 (!) 159/114 (!) 175/123  Pulse: 82 76 77 78  Resp: 13  (!) 23 (!) 24  Temp:      TempSrc:      SpO2: 95% 97% 98% 97%  Weight:      Height:        Constitutional: NAD, lethargic Vitals:   12/25/19 0029 12/25/19 0058 12/25/19 0117 12/25/19 0147  BP: (!) 124/104 (!) 155/113 (!) 159/114 (!) 175/123  Pulse: 82 76 77 78  Resp: 13  (!) 23 (!) 24  Temp:      TempSrc:      SpO2: 95% 97% 98% 97%  Weight:      Height:       General: WDWN,  somnolent.  Opens eyes to painful stimuli but does not verbally respond and goes back to sleep Eyes:  PERRL, lids and conjunctivae normal.  Sclera nonicteric.  Mild exophthalmos HENT:  [place]/AT, external ears normal.  Nares patent without epistasis.  Mucous membranes are moist. Posterior pharynx clear of any exudate or lesions.  Neck: Soft, normal passive range of motion, supple, no masses, no thyromegaly.  Trachea midline Respiratory: Equal breath sounds.  Few scattered rails.  Diffuse expiratory wheezing.  Rhonchi.  No crackles. Normal respiratory effort. No accessory muscle use.  Cardiovascular: Regular rate and rhythm, no murmurs / rubs / gallops.  Mild lower extremity edema. 1+ pedal pulses. Abdomen: Soft, no tenderness, nondistended, no rebound or guarding.  No masses palpated. No hepatosplenomegaly. Bowel sounds normoactive Musculoskeletal: Normal passive range of motion.  Clubbing of digits present.  cyanosis. No joint deformity of upper or lower extremities. Normal muscle tone.  Skin: Warm, dry, intact no rashes, lesions, ulcers. No induration Neurologic: Moves all 4 extremities spontaneously.  Withdraws to painful stimuli.  Patella DTR +1 bilaterally.  Admits to downgoing bilaterally   Labs on Admission: I have personally reviewed following labs and imaging studies  CBC: Recent Labs  Lab 12/24/19 2220  WBC 10.0  HGB 15.1  HCT 46.9  MCV 91.4  PLT 190    Basic Metabolic Panel: Recent Labs  Lab 12/24/19 2220  NA 140  K 3.9  CL 108  CO2 24  GLUCOSE 73  BUN 30*  CREATININE 2.07*  CALCIUM 8.5*    GFR: Estimated Creatinine Clearance: 49.6 mL/min (A) (by C-G formula based on SCr of 2.07 mg/dL (H)).  Liver Function Tests: No results for input(s): AST, ALT, ALKPHOS, BILITOT, PROT, ALBUMIN in the last 168 hours.  Urine analysis:    Component Value Date/Time   COLORURINE STRAW (A) 08/10/2018 2230   APPEARANCEUR Clear 11/30/2018 1035   LABSPEC 1.005 08/10/2018 2230    PHURINE 6.0 08/10/2018 2230   GLUCOSEU Negative 11/30/2018 1035   HGBUR LARGE (A) 08/10/2018 2230   BILIRUBINUR Negative 11/30/2018 1035   KETONESUR NEGATIVE 08/10/2018 2230   PROTEINUR 1+ (A) 11/30/2018 1035   PROTEINUR 100 (A) 08/10/2018 2230   NITRITE Negative 11/30/2018 1035   NITRITE NEGATIVE 08/10/2018 2230   LEUKOCYTESUR Negative 11/30/2018 1035   LEUKOCYTESUR NEGATIVE 08/10/2018 2230    Radiological Exams on Admission: DG Chest Port 1 View  Result Date: 12/24/2019 CLINICAL DATA:  Dyspnea, hypoxia EXAM: PORTABLE CHEST 1 VIEW COMPARISON:  10/27/2019 FINDINGS: The left costophrenic angle is excluded from view on this portable semi-upright chest radiograph. The visualized lungs are clear. No pneumothorax. No pleural effusion on the right. Moderate cardiomegaly is stable when compared to prior examination. There is central pulmonary vascular congestion again noted without overt pulmonary edema. IMPRESSION: Technically limited, but stable examination with unchanged cardiomegaly and central pulmonary vascular congestion. Electronically Signed   By: Helyn Numbers MD   On: 12/24/2019 22:50    EKG: Independently reviewed.  EKG shows normal sinus rhythm.  Noted with ST changes but no acute ST elevation or depression.  Assessment/Plan Principal Problem:   Acute on chronic combined systolic and diastolic CHF (congestive heart failure) [facility]) Mr. Mccusker will be admitted to cardiac telemetry floor. Diuresis with Lasix 60 mg twice daily for the next 2 days.  Monitor INO's.  Monitor daily weight. Patient had echocardiogram in May 2021 which showed an EF of 30 to 35%. We will check serial troponin levels ensure no ischemic event as etiology of CHF exacerbation.  Start Entresto with his CHF Patient is already on Coreg and bildil which will be continued.  Coreg is noted to be a beta-blocker that is safe to use and cocaine positive patients    Essential hypertension Continue Coreg and Bildil.   Entresto added    Mixed hyperlipidemia Continue statin therapy.  Check lipid panel.    CKD (chronic kidney disease) stage 3, GFR 30-59 ml/min Stable.  Continue to monitor    COPD (chronic obstructive  pulmonary disease) (HCC) Dulera is substituted for Symbicort will be continued twice a day.  DuoNeb every 6 hours as needed for shortness of breath, wheeze, cough    Elevated troponin I level Patient with chronic elevated troponin levels.  We will check serial troponins during this admission.     Diabetes mellitus type 2 Check hemoglobin A1c. Check blood sugars qac qhs.  Sliding scale insulin as needed for glycemic control.     Cocaine abuse Consult social work to assist with identifying rehabilitation program for patient    DVT prophylaxis: Lovenox for DVT prophylaxis Code Status:   Full code Family Communication:  Patient is lethargic and cannot participate in discussion of plan of care.  No family bedside  Disposition Plan:   Patient is from:  Home  Anticipated DC to:  Home  Anticipated DC date:  Dissipate greater than 2 midnight stay in the hospital to treat medical condition  Anticipated DC barriers: No identified barriers to discharge at this time.  Patient will need social work evaluation to determine if any unidentified barriers are present   Admission status:  Inpatient  Severity of Illness: The appropriate patient status for this patient is INPATIENT. Inpatient status is judged to be reasonable and necessary in order to provide the required intensity of service to ensure the patient's safety. The patient's presenting symptoms, physical exam findings, and initial radiographic and laboratory data in the context of their chronic comorbidities is felt to place them at high risk for further clinical deterioration. Furthermore, it is not anticipated that the patient will be medically stable for discharge from the hospital within 2 midnights of admission. The following factors  support the patient status of inpatient.   " The patient's presenting symptoms include shortness of breath, diaphoresis. " The worrisome physical exam findings include hypoxia and shortness of breath. " The initial radiographic and laboratory data are worrisome because of limited BNP, elevated troponin.  Chest x-ray consistent with CHF. " The chronic co-morbidities include chronic medical problems as above.   * I certify that at the point of admission it is my clinical judgment that the patient will require inpatient hospital care spanning beyond 2 midnights from the point of admission due to high intensity of service, high risk for further deterioration and high frequency of surveillance required.Claudean Severance Jaylanie Boschee MD [facility]  How to contact the [facility] Attending or Consulting provider 7A - 7P or covering provider during after hours 7P -7A, for this patient?   1. Check the care team in [facility] and look for a) attending/consulting TRH provider listed and b) the [facility] team listed 2. Log into [email or web address] and use [place]'s universal password to access. If you do not have the password, please contact the hospital operator. 3. Locate the [facility] provider you are looking for under [facility] and page to a number that you can be directly reached. 4. If you still have difficulty reaching the provider, please page the [facility] (Director on Call) for the Hospitalists listed on amion for assistance.  12/25/2019, 2:05 AM

## 2019-12-25 NOTE — Progress Notes (Signed)
  Echocardiogram 2D Echocardiogram has been performed.  Chris [name] 12/25/2019, 3:50 PM

## 2019-12-25 NOTE — Progress Notes (Signed)
Unable to place pt on BIPAP at this time. Pt is unresponsive to sternal rub. Pt would not be able to pull mask off if he were to vomit. MD aware. RT will continue to monitor.

## 2019-12-25 NOTE — ED Notes (Signed)
Pt is asleep and not responding well to verbal stimulation. Pt has minimal response to sternal rub. Per policy, RT can not start BiPap d/t pt not being alert enough to be able to take mask off. ABG will be obtained by RT. MD. Roxanne Mins made aware as well as PA, Percell Miller.

## 2019-12-26 LAB — BASIC METABOLIC PANEL
Anion gap: 12 (ref 5–15)
BUN: 31 mg/dL — ABNORMAL HIGH (ref 6–20)
CO2: 23 mmol/L (ref 22–32)
Calcium: 8.4 mg/dL — ABNORMAL LOW (ref 8.9–10.3)
Chloride: 102 mmol/L (ref 98–111)
Creatinine, Ser: 1.83 mg/dL — ABNORMAL HIGH (ref 0.61–1.24)
GFR calc Af Amer: 46 mL/min — ABNORMAL LOW (ref >60)
GFR calc non Af Amer: 40 mL/min — ABNORMAL LOW (ref >60)
Glucose, Bld: 164 mg/dL — ABNORMAL HIGH (ref 70–99)
Potassium: 4.2 mmol/L (ref 3.5–5.1)
Sodium: 137 mmol/L (ref 135–145)

## 2019-12-26 LAB — GLUCOSE, CAPILLARY
Glucose-Capillary: 138 mg/dL — ABNORMAL HIGH (ref 70–99)
Glucose-Capillary: 144 mg/dL — ABNORMAL HIGH (ref 70–99)
Glucose-Capillary: 191 mg/dL — ABNORMAL HIGH (ref 70–99)
Glucose-Capillary: 116 mg/dL — ABNORMAL HIGH (ref 70–99)

## 2019-12-26 MED ORDER — AZITHROMYCIN 500 MG PO TABS
500.0000 mg | ORAL_TABLET | Freq: Every day | ORAL | Status: DC
  Administered 2019-12-26 – 2019-12-27 (×2): 500 mg via ORAL
  Filled 2019-12-26 (×2): qty 1

## 2019-12-26 MED ORDER — PREDNISONE 20 MG PO TABS
20.0000 mg | ORAL_TABLET | Freq: Every day | ORAL | Status: DC
  Administered 2019-12-26 – 2019-12-27 (×2): 20 mg via ORAL
  Filled 2019-12-26 (×2): qty 1

## 2019-12-26 MED ORDER — FUROSEMIDE 40 MG PO TABS
60.0000 mg | ORAL_TABLET | Freq: Every day | ORAL | Status: DC
  Administered 2019-12-26 – 2019-12-27 (×2): 60 mg via ORAL
  Filled 2019-12-26 (×2): qty 1

## 2019-12-26 NOTE — Progress Notes (Signed)
SATURATION QUALIFICATIONS: (This note is used to comply with regulatory documentation for home oxygen)  Patient Saturations on Room Air at Rest = 94%  Patient Saturations on Room Air while Ambulating = 88%  Patient Saturations on 2 Liters of oxygen while Ambulating = 91%  Please briefly explain why patient needs home oxygen: Irven Baltimore, RN

## 2019-12-26 NOTE — TOC Initial Note (Addendum)
Transition of Care [facility]) - Initial/Assessment Note    Patient Details  Name: Chris [name] MRN: [id number] Date of Birth: 1960/08/01  [facility]) CM/SW Contact:    [name], RN Phone Number: [phone] 12/26/2019, 3:54 PM  Clinical Narrative:                  Spoke with patient at the bedside to discuss DME oxygen. Patient agreeable, but asking for help with housing at discharge. States that he had been living with significant other, but that is no longer an option. Only previous DME is a cane. States his PCP [facility] at [facility], and he uses the [facility] pharmacy. He states that he missed his last PCP appointment d/t transportation.  For transportation he relies on the bus system.   Patient consented to NCM putting his information into [employer] platform. Verified cell phone, [phone]. Patient states that he cannot live in a shelter and have to drag his stuff around every day. He states that he has an intolerance to heat.   [employer] platform activated - only thing local was [facility] which is not a shelter. Spoke with Debbie at [employer] [facility] - all shelters full right now, however, shelters may not be able to accommodate oxygen needs. Will provide patient with contact numbers for [facility], [place], and [employer].   Update: Spoke with patient at bedside again. Provided contact information for [place]. Patient left voice mail for call back. Contact number for NCM provided to patient. Encouraged to provided NCM number to shelters in order to assist with transition.   DME oxygen has been delivered to room.   TOC following for transition needs.   Expected Discharge Plan: Homeless Shelter Barriers to Discharge: Continued Medical Work up   Patient Goals and CMS Choice Patient states their goals for this hospitalization and ongoing recovery are:: where can I go? CMS Medicare.gov Compare Post Acute Care list provided to::  Patient Choice offered to / list presented to : Patient  Expected Discharge Plan and Services Expected Discharge Plan: Homeless Shelter In-house Referral: Clinical Social Work Discharge Planning Services: CM Consult Post Acute Care Choice: Durable Medical Equipment Living arrangements for the past 2 months: Single Family Home                 DME Arranged: Oxygen DME Agency: [employer] Date DME Agency Contacted: 12/26/19 Time DME Agency Contacted: 1500 Representative spoke with at DME Agency: [place]: NA [place] Agency: NA        Prior Living Arrangements/Services Living arrangements for the past 2 months: [place] with:: Self, Significant Other Patient language and need for interpreter reviewed:: Yes        Need for Family Participation in Patient Care: No (Comment)   Current home services: DME (cane) Criminal Activity/Legal Involvement Pertinent to Current Situation/Hospitalization: No - Comment as needed  Activities of Daily Living      Permission Sought/Granted   Permission granted to share information with : No              Emotional Assessment Appearance:: Appears stated age Attitude/Demeanor/Rapport: Engaged Affect (typically observed): Accepting Orientation: : Oriented to Self, Oriented to  Time, Oriented to Place, Oriented to Situation Alcohol / Substance Use: Illicit Drugs Psych Involvement: No (comment)  Admission diagnosis:  Acute and chronic respiratory failure (acute-on-chronic) (HCC) [J96.20] Acute on chronic combined systolic and diastolic CHF (congestive heart failure) ([place]) [I50.43]  Hypertension, unspecified type [I10] Acute on chronic congestive heart failure, unspecified heart failure type [facility]) [I50.9] Patient Active Problem List   Diagnosis Date Noted  . Type 2 diabetes mellitus with stage 3 chronic kidney disease ([place]) 12/25/2019  . Acute and chronic respiratory failure (acute-on-chronic) ([place]) 12/25/2019  . Acute  on chronic combined systolic and diastolic CHF (congestive heart failure) ([place]) 10/26/2019  . Elevated troponin I level 10/26/2019  . Acute on chronic diastolic (congestive) heart failure ([place]) 10/26/2019  . History of gout 02/01/2019  . Seasonal allergic rhinitis due to pollen 02/01/2019  . Tobacco dependence 11/30/2018  . Microscopic hematuria 11/30/2018  . Depression 11/30/2018  . Difficulty controlling anger 11/30/2018  . COPD (chronic obstructive pulmonary disease) ([place])   . CKD (chronic kidney disease) stage 3, GFR 30-59 ml/min 08/10/2018  . Recurrent epistaxis 04/21/2018  . Mixed hyperlipidemia 07/28/2017  . Essential hypertension 07/28/2017  . Chronic systolic heart failure ([place]) 10/25/2014  . Cocaine abuse ([place]) 02/20/2013  . Cannabis abuse 02/20/2013  . Back pain, chronic 02/20/2013   PCP:  [name], MD Pharmacy:   [facility] [address] [place] [address] Phone: [phone] Fax: [phone]     Social Determinants of Health (SDOH) Interventions    Readmission Risk Interventions Readmission Risk Prevention Plan 10/29/2019  Transportation Screening Complete  PCP or Specialist Appt within 3-5 Days Complete  HRI or [place] Complete  Social Work Consult for [place] Planning/Counseling Complete  Palliative Care Screening Not Applicable  Medication Review Press photographer) Complete  Some recent data might be hidden
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
## 2019-12-26 NOTE — Progress Notes (Signed)
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  Present to the ED after 1 day of progressive shortness of breath that prompted him to call EMS. Patient was found to be hypoxic on admission oxygen saturation in the 80s.  He received albuterol without significant improvement of his shortness of breath.  Chest x-ray showed pulmonary vascular congestion.  Presented with elevated BNP and troponin as previously.  Patient also with altered mental status on admission, hypercapnic hypoxic respiratory failure admitted to the ICU on BiPAP.  For his hypertension he was a started on Cardene drip.  He received IV Laxis.   Assessment & Plan:   Principal Problem:   Acute on chronic combined systolic and diastolic CHF (congestive heart failure) (HCC) Active Problems:   Cocaine abuse (HCC)   Mixed hyperlipidemia   Essential hypertension   CKD (chronic kidney disease) stage 3, GFR 30-59 ml/min   COPD (chronic obstructive pulmonary disease) (HCC)   Elevated troponin I level   Type 2 diabetes mellitus with stage 3 chronic kidney disease (HCC)   Acute and chronic respiratory failure (acute-on-chronic) (HCC)  1-Acute hypoxic hypercapnic respiratory failure: Secondary to COPD exacerbation and acute on chronic systolic and diastolic heart failure heart failure exacerbation. Patient was on BiPAP. Respiratory failure has improved.  Wean off oxygen as possible today. Plan to transition to oral Lasix, oral prednisone. Patient wishes to go home tomorrow.   2-Acute  Hypertensive emergency: Patient was initially started on Cardene drip.  He has been weaned off.  He was a started on Coreg, BiDil, Entresto. Pressure better controlled.  3-CKD  stage IIIb: Creatinine baseline 1.9--2.2 Continue to monitor on Entresto and diuretics  4-Diabetes: Continue with sliding scale insulin 5-elevated troponin level: Related to hypertensive emergency and heart failure exacerbation 6-Cocaine abuse: Counseling provided  Estimated body mass index is 43.87 kg/m as calculated from the following:   Height as of this encounter: 5\' 6"  (1.676 m).   Weight as of this encounter: 123.3 kg.   DVT prophylaxis: Lovenox Code Status: Full code Family Communication: Care discussed with patient Disposition Plan:  Status is: Inpatient  Remains inpatient appropriate because:Hemodynamically unstable   Dispo: The patient is from: Home              Anticipated d/c is to: Home              Anticipated d/c date is: 1 day              Patient currently is not medically stable to d/c.  Plan to transition to oral Lasix, wean him off oxygen as possible.         Consultants:   CCM  Procedures:   ECHO; EF 35 %  Antimicrobials:    Subjective: He is breathing better he wants to go home He still requiring 3 L of oxygen  Objective: Vitals:   12/26/19 0338 12/26/19 0500 12/26/19 0600 12/26/19 0700  BP:  120/87 124/82 (!) 126/103  Pulse:  70 75 73  Resp:  (!) 29 (!) 23 (!) 22  Temp: 97.7 F (36.5 C)     TempSrc: Oral     SpO2:  94% 98% 95%  Weight:      Height:  Intake/Output Summary (Last 24 hours) at 12/26/2019 0723 Last data filed at 12/26/2019 0656 Gross per 24 hour  Intake 2747.46 ml  Output 5325 ml  Net -2577.54 ml   Filed Weights   12/24/19 2215 12/25/19 0624  Weight: 127 kg 123.3 kg    Examination:  General exam: Appears calm and comfortable  Respiratory system: B/L fine crackles, sporadic wheezing Cardiovascular system: S1 & S2 heard, RRR. No JVD, murmurs, rubs, gallops or clicks. No pedal edema. Gastrointestinal system: Abdomen is nondistended, soft and nontender. No organomegaly or masses felt. Normal bowel sounds  heard. Central nervous system: Alert and oriented. No focal neurological deficits. Extremities: Symmetric 5 x 5 power.    Data Reviewed: I have personally reviewed following labs and imaging studies  CBC: Recent Labs  Lab 12/24/19 2220 12/25/19 0239 12/25/19 0632  WBC 10.0  --  7.9  HGB 15.1 16.7 16.7  HCT 46.9 49.0 52.3*  MCV 91.4  --  91.4  PLT 190  --  196   Basic Metabolic Panel: Recent Labs  Lab 12/24/19 2220 12/25/19 0239 12/25/19 0522 12/25/19 1144 12/26/19 0302  NA 140 142  --   --  137  K 3.9 4.1  --   --  4.2  CL 108  --   --   --  102  CO2 24  --   --   --  23  GLUCOSE 73  --   --   --  164*  BUN 30*  --   --   --  31*  CREATININE 2.07*  --  1.98*  --  1.83*  CALCIUM 8.5*  --   --   --  8.4*  MG  --   --   --  1.9  --    GFR: Estimated Creatinine Clearance: 55.2 mL/min (A) (by C-G formula based on SCr of 1.83 mg/dL (H)). Liver Function Tests: No results for input(s): AST, ALT, ALKPHOS, BILITOT, PROT, ALBUMIN in the last 168 hours. No results for input(s): LIPASE, AMYLASE in the last 168 hours. No results for input(s): AMMONIA in the last 168 hours. Coagulation Profile: No results for input(s): INR, PROTIME in the last 168 hours. Cardiac Enzymes: No results for input(s): CKTOTAL, CKMB, CKMBINDEX, TROPONINI in the last 168 hours. BNP (last 3 results) No results for input(s): PROBNP in the last 8760 hours. HbA1C: Recent Labs    12/25/19 0632  HGBA1C 6.8*   CBG: Recent Labs  Lab 12/25/19 1207 12/25/19 1711 12/25/19 1722 12/25/19 1949 12/25/19 2314  GLUCAP 146* 172* 167* 165* 209*   Lipid Profile: Recent Labs    12/25/19 0632  CHOL 176  HDL 37*  LDLCALC 121*  TRIG 91  CHOLHDL 4.8   Thyroid Function Tests: No results for input(s): TSH, T4TOTAL, FREET4, T3FREE, THYROIDAB in the last 72 hours. Anemia Panel: No results for input(s): VITAMINB12, FOLATE, FERRITIN, TIBC, IRON, RETICCTPCT in the last 72 hours. Sepsis Labs: No results for  input(s): PROCALCITON, LATICACIDVEN in the last 168 hours.  Recent Results (from the past 240 hour(s))  SARS Coronavirus 2 by RT PCR (hospital order, performed in [facility] hospital lab) Nasopharyngeal Nasopharyngeal Swab     Status: None   Collection Time: 12/24/19 11:17 PM   Specimen: Nasopharyngeal Swab  Result Value Ref Range Status   SARS Coronavirus 2 NEGATIVE NEGATIVE Final    Comment: (NOTE) SARS-CoV-2 target nucleic acids are NOT DETECTED.  The SARS-CoV-2 RNA is generally detectable in upper and lower respiratory specimens during  the acute phase of infection. The lowest concentration of SARS-CoV-2 viral copies this assay can detect is 250 copies / mL. A negative result does not preclude SARS-CoV-2 infection and should not be used as the sole basis for treatment or other patient management decisions.  A negative result may occur with improper specimen collection / handling, submission of specimen other than nasopharyngeal swab, presence of viral mutation(s) within the areas targeted by this assay, and inadequate number of viral copies (<250 copies / mL). A negative result must be combined with clinical observations, patient history, and epidemiological information.  Fact Sheet for Patients:   BoilerBrush.com.cy  Fact Sheet for Healthcare Providers: https://pope.com/  This test is not yet approved or  cleared by the [place] FDA and has been authorized for detection and/or diagnosis of SARS-CoV-2 by FDA under an Emergency Use Authorization (EUA).  This EUA will remain in effect (meaning this test can be used) for the duration of the COVID-19 declaration under Section 564(b)(1) of the Act, 21 U.S.C. section 360bbb-3(b)(1), unless the authorization is terminated or revoked sooner.  Performed at [facility], [address], [place], [place] [address]   MRSA PCR Screening     Status: None   Collection Time:  12/25/19  6:32 AM   Specimen: Nasal Mucosa; Nasopharyngeal  Result Value Ref Range Status   MRSA by PCR NEGATIVE NEGATIVE Final    Comment:        The GeneXpert MRSA Assay (FDA approved for NASAL specimens only), is one component of a comprehensive MRSA colonization surveillance program. It is not intended to diagnose MRSA infection nor to guide or monitor treatment for MRSA infections. Performed at [facility], [address], [place], [place] [address]          Radiology Studies: DG Chest Port 1 View  Result Date: 12/24/2019 CLINICAL DATA:  Dyspnea, hypoxia EXAM: PORTABLE CHEST 1 VIEW COMPARISON:  10/27/2019 FINDINGS: The left costophrenic angle is excluded from view on this portable semi-upright chest radiograph. The visualized lungs are clear. No pneumothorax. No pleural effusion on the right. Moderate cardiomegaly is stable when compared to prior examination. There is central pulmonary vascular congestion again noted without overt pulmonary edema. IMPRESSION: Technically limited, but stable examination with unchanged cardiomegaly and central pulmonary vascular congestion. Electronically Signed   By: Helyn Numbers MD   On: 12/24/2019 22:50   ECHOCARDIOGRAM LIMITED  Result Date: 12/25/2019    ECHOCARDIOGRAM LIMITED REPORT   Patient Name:   Chris [name] Date of Exam: 12/25/2019 Medical Rec #:  [id number]       Height:       66.0 in Accession #:    [id number]      Weight:       271.8 lb Date of Birth:  1960/12/25       BSA:          2.278 m Patient Age:    59 years        BP:           148/116 mmHg Patient Gender: M               HR:           76 bpm. Exam Location:  Inpatient Procedure: Limited Echo, Limited Color Doppler and Cardiac Doppler Indications:    Cardiomyopathy 425.9  History:        Patient has prior history of Echocardiogram examinations, most  recent 10/27/2019. CHF, COPD and chronic kidney disease; Risk                 Factors:Hypertension,  Dyslipidemia, Diabetes and cocaine use.  Sonographer:    Delcie Roch Referring Phys: [id number] JESSICA MARSHALL IMPRESSIONS  1. Diffuse hypokinesis with more pronounced hypokinesis in inferior/lateral walls. Left ventricular ejection fraction, by estimation, is 30 to 35%. The left ventricle has moderately decreased function. The left ventricle demonstrates global hypokinesis.  2. The mitral valve is normal in structure. Trivial mitral valve regurgitation.  3. The aortic valve is grossly normal. Aortic valve regurgitation is not visualized. No aortic stenosis is present.  4. The inferior vena cava is dilated in size with >50% respiratory variability, suggesting right atrial pressure of 8 mmHg. Comparison(s): No significant change from prior study. FINDINGS  Left Ventricle: Diffuse hypokinesis with more pronounced hypokinesis in inferior/lateral walls. Left ventricular ejection fraction, by estimation, is 30 to 35%. The left ventricle has moderately decreased function. The left ventricle demonstrates global  hypokinesis. Mitral Valve: The mitral valve is normal in structure. Trivial mitral valve regurgitation. Tricuspid Valve: The tricuspid valve is normal in structure. Tricuspid valve regurgitation is trivial. Aortic Valve: The aortic valve is grossly normal. Aortic valve regurgitation is not visualized. No aortic stenosis is present. Pulmonic Valve: The pulmonic valve was not well visualized. Pulmonic valve regurgitation is not visualized. Venous: The inferior vena cava is dilated in size with greater than 50% respiratory variability, suggesting right atrial pressure of 8 mmHg.  LEFT VENTRICLE PLAX 2D LVIDd:         6.40 cm  Diastology LVIDs:         5.10 cm  LV e' lateral:   5.66 cm/s LV PW:         1.30 cm  LV E/e' lateral: 13.0 LV IVS:        1.00 cm  LV e' medial:    4.35 cm/s LVOT diam:     2.30 cm  LV E/e' medial:  16.9 LV SV:         79 LV SV Index:   35 LVOT Area:     4.15 cm  IVC IVC diam: 2.30 cm LEFT  ATRIUM         Index LA diam:    4.00 cm 1.76 cm/m  AORTIC VALVE LVOT Vmax:   123.00 cm/s LVOT Vmean:  71.200 cm/s LVOT VTI:    0.190 m  AORTA Ao Root diam: 4.00 cm MITRAL VALVE MV Area (PHT): 3.03 cm    SHUNTS MV Decel Time: 250 msec    Systemic VTI:  0.19 m MV E velocity: 73.70 cm/s  Systemic Diam: 2.30 cm MV A velocity: 72.00 cm/s MV E/A ratio:  1.02 Jodelle Red MD Electronically signed by Jodelle Red MD Signature Date/Time: 12/25/2019/7:33:36 PM    Final         Scheduled Meds: . aspirin  81 mg Oral Daily  . atorvastatin  40 mg Oral q1800  . carvedilol  25 mg Oral BID WC  . Chlorhexidine Gluconate Cloth  6 each Topical Daily  . enoxaparin (LOVENOX) injection  40 mg Subcutaneous Q24H  . insulin aspart  0-20 Units Subcutaneous TID WC  . ipratropium-albuterol  3 mL Nebulization Q4H  . isosorbide-hydrALAZINE  1 tablet Oral TID  . methylPREDNISolone (SOLU-MEDROL) injection  40 mg Intravenous Q12H  . mometasone-formoterol  2 puff Inhalation BID  . potassium chloride  10 mEq Oral BID  . sacubitril-valsartan  1  tablet Oral BID  . sodium chloride flush  3 mL Intravenous Q12H   Continuous Infusions: . sodium chloride    . azithromycin Stopped (12/25/19 1115)     LOS: 1 day    Time spent: 35 minutes    Shivam Mestas A Lyn Deemer, MD [facility]   If 7PM-7AM, please contact night-coverage [email or web address]  12/26/2019, 7:23 AM

## 2019-12-26 NOTE — TOC Progression Note (Signed)
Transition of Care [facility]) - Progression Note    Patient Details  Name: Chris [name] MRN: [id number] Date of Birth: [birth year]  Transition of [facility]) CM/SW Contact  [name], RN Phone Number: [phone] 12/26/2019, 2:52 PM  Clinical Narrative:     Notified by nursing of patient need for home oxygen. Pulmonary ambulation note supportive for oxygen need. DME order for oxygen placed. Referral placed to [employer] for delivery to room. TOC following for transition needs.      Expected Discharge Plan and Services                                                 Social Determinants of Health (SDOH) Interventions    Readmission Risk Interventions Readmission Risk Prevention Plan 10/29/2019  Transportation Screening Complete  PCP or Specialist Appt within 3-5 Days Complete  HRI or [place] Complete  Social Work Consult for [place] Planning/Counseling Complete  Palliative Care Screening Not Applicable  Medication Review Press photographer) Complete  Some recent data might be hidden

## 2019-12-26 NOTE — Progress Notes (Addendum)
Patient given number for patient experience to call to report his lost items,he then tells me his socks were left on the chair his old room writer had his nurse tech go see about his socks,told him if they could not be located he could report them as well offered him our unit socks he stated had socks wanted his socks told he him nurse tech do his best try to locate them if possible. He becomes argumenative very easily has a hair trigger temper with male staff per report his last nurse stated patient did better with male staff. Writer informed charge nurse of previous transferring nurse report.

## 2019-12-26 NOTE — Progress Notes (Signed)
Writer gave report to oncoming shift explained to oncoming nurse we addressed his c/o's with missing belongings gave him number to call for follow up he states his belongings did not get missing till he reached his room here basically was accusing me and male nurse of taking his belongings stated he seen them before he came to our unit-reported this in front of on coming nurse. Writer denied taking his things reported to on coming nurse Hersey tech in the room whole time we seen no shirt or socks on his arrival just the small white hospital bag patient has in his possession. He then stated was not going to report the missing items was not calling the number. While I was giving report on patient across the hall he was peeking out the door acting strange. Looked up his hx has mental disorder not sure exactly what the disorder is not stated in the chart. No further changes noted.

## 2019-12-27 LAB — BASIC METABOLIC PANEL
Anion gap: 11 (ref 5–15)
BUN: 40 mg/dL — ABNORMAL HIGH (ref 6–20)
CO2: 27 mmol/L (ref 22–32)
Calcium: 8.5 mg/dL — ABNORMAL LOW (ref 8.9–10.3)
Chloride: 98 mmol/L (ref 98–111)
Creatinine, Ser: 2.2 mg/dL — ABNORMAL HIGH (ref 0.61–1.24)
GFR calc Af Amer: 37 mL/min — ABNORMAL LOW (ref >60)
GFR calc non Af Amer: 32 mL/min — ABNORMAL LOW (ref >60)
Glucose, Bld: 114 mg/dL — ABNORMAL HIGH (ref 70–99)
Potassium: 4 mmol/L (ref 3.5–5.1)
Sodium: 136 mmol/L (ref 135–145)

## 2019-12-27 LAB — GLUCOSE, CAPILLARY
Glucose-Capillary: 122 mg/dL — ABNORMAL HIGH (ref 70–99)
Glucose-Capillary: 98 mg/dL (ref 70–99)

## 2019-12-27 MED ORDER — AZITHROMYCIN 500 MG PO TABS: 500.0000 mg | ORAL_TABLET | Freq: Every day | ORAL | 0 refills | Status: DC

## 2019-12-27 MED ORDER — SACUBITRIL-VALSARTAN 24-26 MG PO TABS: 1.0000 | ORAL_TABLET | Freq: Two times a day (BID) | ORAL | 1 refills | Status: DC

## 2019-12-27 MED ORDER — SACUBITRIL-VALSARTAN 24-26 MG PO TABS
1.0000 | ORAL_TABLET | Freq: Two times a day (BID) | ORAL | Status: DC
  Administered 2019-12-27: 1 via ORAL
  Filled 2019-12-27: qty 1

## 2019-12-27 MED ORDER — ACCU-CHEK GUIDE VI STRP: ORAL_STRIP | 12 refills | Status: DC

## 2019-12-27 MED ORDER — LANCING DEVICE MISC: 1.0000 | Freq: Every day | 0 refills | Status: DC

## 2019-12-27 MED ORDER — ALBUTEROL SULFATE (2.5 MG/3ML) 0.083% IN NEBU: 3.0000 mL | INHALATION_SOLUTION | Freq: Four times a day (QID) | RESPIRATORY_TRACT | 1 refills | Status: DC | PRN

## 2019-12-27 MED ORDER — FUROSEMIDE 40 MG PO TABS: 60.0000 mg | ORAL_TABLET | Freq: Every day | ORAL | 6 refills | Status: DC

## 2019-12-27 MED ORDER — ISOSORB DINITRATE-HYDRALAZINE 20-37.5 MG PO TABS: 1.0000 | ORAL_TABLET | Freq: Three times a day (TID) | ORAL | 1 refills | Status: DC

## 2019-12-27 MED ORDER — CARVEDILOL 25 MG PO TABS: 25.0000 mg | ORAL_TABLET | Freq: Two times a day (BID) | ORAL | 1 refills | Status: DC

## 2019-12-27 MED ORDER — ATORVASTATIN CALCIUM 40 MG PO TABS: 40.0000 mg | ORAL_TABLET | Freq: Every day | ORAL | 2 refills | Status: DC

## 2019-12-27 MED ORDER — PREDNISONE 20 MG PO TABS: 20.0000 mg | ORAL_TABLET | Freq: Every day | ORAL | 0 refills | Status: DC

## 2019-12-27 MED ORDER — BLOOD GLUCOSE METER KIT: PACK | 0 refills | Status: DC

## 2019-12-27 MED ORDER — NICOTINE 21 MG/24HR TD PT24: 21.0000 mg | MEDICATED_PATCH | Freq: Every day | TRANSDERMAL | 0 refills | Status: DC

## 2019-12-27 MED ORDER — GLIPIZIDE 5 MG PO TABS: 2.5000 mg | ORAL_TABLET | Freq: Two times a day (BID) | ORAL | 2 refills | Status: DC

## 2019-12-27 MED ORDER — MONTELUKAST SODIUM 10 MG PO TABS: 10.0000 mg | ORAL_TABLET | Freq: Every day | ORAL | 1 refills | Status: DC

## 2019-12-27 MED FILL — AZITHROMYCIN 500 MG TABLET: 500 | 2 days supply | Qty: 2 | Fill #0

## 2019-12-27 MED FILL — ATORVASTATIN CALCIUM 40 MG: 40 | 90 days supply | Qty: 90 | Fill #0

## 2019-12-27 MED FILL — BIDIL 20-37.5 MG TABS: 20-37.5 | 30 days supply | Qty: 90 | Fill #0

## 2019-12-27 MED FILL — predniSONE 20 MG TABS: 20 | 2 days supply | Qty: 2 | Fill #0

## 2019-12-27 MED FILL — NICOTINE 21 MG/24HR PATCH: 21 | 28 days supply | Qty: 28 | Fill #0

## 2019-12-27 MED FILL — FUROSEMIDE 40 MG TAB: 40 | 90 days supply | Qty: 135 | Fill #0

## 2019-12-27 MED FILL — ALBUTEROL SUL 2.5 MG/3 ML S: (2.5 MG/3ML | 7 days supply | Qty: 75 | Fill #0

## 2019-12-27 NOTE — Plan of Care (Signed)
  Problem: Education: Goal: Knowledge of General Education information will improve Description: Including pain rating scale, medication(s)/side effects and non-pharmacologic comfort measures 12/27/2019 1156 by Rolm Baptise, RN Outcome: Adequate for Discharge 12/27/2019 1109 by Rolm Baptise, RN Outcome: Adequate for Discharge   Problem: Health Behavior/Discharge Planning: Goal: Ability to manage health-related needs will improve 12/27/2019 1156 by Rolm Baptise, RN Outcome: Adequate for Discharge 12/27/2019 1109 by Rolm Baptise, RN Outcome: Adequate for Discharge   Problem: Clinical Measurements: Goal: Ability to maintain clinical measurements within normal limits will improve 12/27/2019 1156 by Rolm Baptise, RN Outcome: Adequate for Discharge 12/27/2019 1109 by Rolm Baptise, RN Outcome: Adequate for Discharge Goal: Will remain free from infection 12/27/2019 1156 by Rolm Baptise, RN Outcome: Adequate for Discharge 12/27/2019 1109 by Rolm Baptise, RN Outcome: Adequate for Discharge Goal: Diagnostic test results will improve 12/27/2019 1156 by Rolm Baptise, RN Outcome: Adequate for Discharge 12/27/2019 1109 by Rolm Baptise, RN Outcome: Adequate for Discharge Goal: Respiratory complications will improve 12/27/2019 1156 by Rolm Baptise, RN Outcome: Adequate for Discharge 12/27/2019 1109 by Rolm Baptise, RN Outcome: Adequate for Discharge Goal: Cardiovascular complication will be avoided 12/27/2019 1156 by Rolm Baptise, RN Outcome: Adequate for Discharge 12/27/2019 1109 by Rolm Baptise, RN Outcome: Adequate for Discharge   Problem: Activity: Goal: Risk for activity intolerance will decrease 12/27/2019 1156 by Rolm Baptise, RN Outcome: Adequate for Discharge 12/27/2019 1109 by Rolm Baptise, RN Outcome: Adequate for Discharge   Problem: Nutrition: Goal: Adequate nutrition will be maintained 12/27/2019 1156 by Rolm Baptise, RN Outcome:  Adequate for Discharge 12/27/2019 1109 by Rolm Baptise, RN Outcome: Adequate for Discharge   Problem: Coping: Goal: Level of anxiety will decrease 12/27/2019 1156 by Rolm Baptise, RN Outcome: Adequate for Discharge 12/27/2019 1109 by Rolm Baptise, RN Outcome: Adequate for Discharge   Problem: Elimination: Goal: Will not experience complications related to bowel motility 12/27/2019 1156 by Rolm Baptise, RN Outcome: Adequate for Discharge 12/27/2019 1109 by Rolm Baptise, RN Outcome: Adequate for Discharge Goal: Will not experience complications related to urinary retention 12/27/2019 1156 by Rolm Baptise, RN Outcome: Adequate for Discharge 12/27/2019 1109 by Rolm Baptise, RN Outcome: Adequate for Discharge   Problem: Pain Managment: Goal: General experience of comfort will improve 12/27/2019 1156 by Rolm Baptise, RN Outcome: Adequate for Discharge 12/27/2019 1109 by Rolm Baptise, RN Outcome: Adequate for Discharge   Problem: Safety: Goal: Ability to remain free from injury will improve 12/27/2019 1156 by Rolm Baptise, RN Outcome: Adequate for Discharge 12/27/2019 1109 by Rolm Baptise, RN Outcome: Adequate for Discharge   Problem: Skin Integrity: Goal: Risk for impaired skin integrity will decrease 12/27/2019 1156 by Rolm Baptise, RN Outcome: Adequate for Discharge 12/27/2019 1109 by Rolm Baptise, RN Outcome: Adequate for Discharge

## 2019-12-27 NOTE — Discharge Summary (Signed)
Physician Discharge Summary  Chris [name] XNT:[id number] DOB: April 22, 1961 DOA: 12/24/2019  PCP: [name], MD  Admit date: 12/24/2019 Discharge date: 12/27/2019  Admitted From: Home  Disposition: Home   Recommendations for Outpatient Follow-up:  Follow up with PCP in 1-2 weeks Please obtain BMP/CBC in one week Needs B--met to monitor renal function.  Needs follow up with cardiologist.  Encourage smoking cessation and avoidance recreational drug use.   Home Health: no  Discharge Condition: Stable.  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  Present to the ED after 1 day of progressive shortness of breath that prompted him to call EMS. Patient was found to be hypoxic on admission oxygen saturation in the 80s.  He received albuterol without significant improvement of his shortness of breath.  Chest x-ray showed pulmonary vascular congestion.  Presented with elevated BNP and troponin as previously.  Patient also with altered mental status on admission, hypercapnic hypoxic respiratory failure admitted to the ICU on BiPAP.  For his hypertension he was a started on Cardene drip.  He received IV Laxis.  1-Acute hypoxic hypercapnic respiratory failure: Secondary to COPD exacerbation and acute on chronic systolic and diastolic heart failure heart failure exacerbation. Patient was on BiPAP. Respiratory failure has improved.  Wean off oxygen as possible today. Plan to transition to oral Lasix, oral prednisone. Oxygen arrange for home Continue with lasix, entresto.    2-Acute  Hypertensive emergency: Patient was initially started on Cardene drip.  He has been weaned off.  He was a started on Coreg, BiDil, Entresto. Pressure better  controlled.  3-CKD stage IIIb: Creatinine baseline 1.9--2.2 Continue to monitor on Entresto and diuretics Stable.   4-Diabetes: Continue with sliding scale insulin 5-elevated troponin level: Related to hypertensive emergency and heart failure exacerbation 6-Cocaine abuse: Counseling provided    Discharge Diagnoses:  Principal Problem:   Acute on chronic combined systolic and diastolic CHF (congestive heart failure) (HCC) Active Problems:   Cocaine abuse (HCC)   Mixed hyperlipidemia   Essential hypertension   CKD (chronic kidney disease) stage 3, GFR 30-59 ml/min   COPD (chronic obstructive pulmonary disease) (HCC)   Elevated troponin I level   Type 2 diabetes mellitus with stage 3 chronic kidney disease ([place])   Acute and chronic respiratory failure (acute-on-chronic) ([facility])    Discharge Instructions  Discharge Instructions    Diet - low sodium heart healthy   Complete by: As directed    Increase activity slowly   Complete by: As directed      Allergies as of 12/27/2019   No Known Allergies     Medication List    STOP taking these medications   Accu-Chek Softclix Lancets lancets   benzonatate 100 MG capsule Commonly known as: TESSALON   Blood Pressure Monitor Devi   Mitigare 0.6 MG Caps Generic drug: Colchicine     TAKE these medications   Accu-Chek Guide test strip Generic drug: glucose blood Use as instructed. Check blood glucose by fingerstick once per day.   albuterol 108 (90 Base) MCG/ACT inhaler Commonly known as: VENTOLIN HFA Inhale 2 puffs into the lungs every 6 (six) hours as needed for wheezing or shortness of breath.   albuterol (2.5 MG/3ML) 0.083% nebulizer solution Commonly known as: PROVENTIL Take 3 mLs by nebulization every 6 (six) hours as needed  for shortness of breath.   allopurinol 100 MG tablet Commonly known as: ZYLOPRIM Take 100 mg by mouth daily.   aspirin 81 MG EC tablet Take 1 tablet (81 mg total) by mouth daily.    atorvastatin 40 MG tablet Commonly known as: LIPITOR Take 1 tablet (40 mg total) by mouth daily at 6 PM.   azithromycin 500 MG tablet Commonly known as: ZITHROMAX Take 1 tablet (500 mg total) by mouth daily.   blood glucose meter kit and supplies Dispense based on patient and insurance preference. Use up to four times daily as directed. (FOR ICD-10 E10.9, E11.9).   budesonide-formoterol 160-4.5 MCG/ACT inhaler Commonly known as: SYMBICORT Inhale 2 puffs into the lungs 2 (two) times daily.   carvedilol 25 MG tablet Commonly known as: COREG Take 1 tablet (25 mg total) by mouth 2 (two) times daily with a meal.   fluticasone 50 MCG/ACT nasal spray Commonly known as: FLONASE Place 1 spray into both nostrils daily as needed for allergies or rhinitis.   furosemide 40 MG tablet Commonly known as: LASIX Take 1.5 tablets (60 mg total) by mouth daily.   glipiZIDE 5 MG tablet Commonly known as: GLUCOTROL Take 0.5 tablets (2.5 mg total) by mouth 2 (two) times daily before a meal.   isosorbide-hydrALAZINE 20-37.5 MG tablet Commonly known as: BIDIL Take 1 tablet by mouth 3 (three) times daily.   Lancing Device Misc 1 Device by Does not apply route daily.   montelukast 10 MG tablet Commonly known as: SINGULAIR Take 1 tablet (10 mg total) by mouth at bedtime.   nicotine 21 mg/24hr patch Commonly known as: NICODERM CQ - dosed in mg/24 hours Place 1 patch (21 mg total) onto the skin daily.   predniSONE 20 MG tablet Commonly known as: DELTASONE Take 1 tablet (20 mg total) by mouth daily with breakfast.   sacubitril-valsartan 24-26 MG Commonly known as: ENTRESTO Take 1 tablet by mouth 2 (two) times daily.   traZODone 50 MG tablet Commonly known as: DESYREL Take 0.5-1 tablets (25-50 mg total) by mouth at bedtime as needed for sleep.            Durable Medical Equipment  (From admission, onward)         Start     Ordered   12/27/19 0830  For home use only DME  Nebulizer/meds  Once       Question Answer Comment  Patient needs a nebulizer to treat with the following condition COPD exacerbation ([place])   Length of Need 6 Months      12/27/19 0829   12/26/19 0833  For home use only DME oxygen  Once       Question Answer Comment  Length of Need 6 Months   Mode or (Route) Nasal cannula   Liters per Minute 2   Frequency Continuous (stationary and portable oxygen unit needed)   Oxygen delivery system Gas      12/26/19 0832          Follow-up Information    [name], MD Follow up in 1 week(s).   Specialty: Internal Medicine Why: you need lab work in 3 days to follow kidney function.  Contact information: [place] [address] [phone]              No Known Allergies  Consultations: None  Procedures/Studies: DG Chest Port 1 View  Result Date: 12/24/2019 CLINICAL DATA:  Dyspnea, hypoxia EXAM: PORTABLE CHEST 1 VIEW COMPARISON:  10/27/2019  FINDINGS: The left costophrenic angle is excluded from view on this portable semi-upright chest radiograph. The visualized lungs are clear. No pneumothorax. No pleural effusion on the right. Moderate cardiomegaly is stable when compared to prior examination. There is central pulmonary vascular congestion again noted without overt pulmonary edema. IMPRESSION: Technically limited, but stable examination with unchanged cardiomegaly and central pulmonary vascular congestion. Electronically Signed   By: Fidela Salisbury MD   On: 12/24/2019 22:50   ECHOCARDIOGRAM LIMITED  Result Date: 12/25/2019    ECHOCARDIOGRAM LIMITED REPORT   Patient Name:   ZEDDIE NJIE Date of Exam: 12/25/2019 Medical Rec #:  [id number]       Height:       66.0 in Accession #:    [id number]      Weight:       271.8 lb Date of Birth:  [birth year]       BSA:          2.278 m Patient Age:    89 years        BP:           148/116 mmHg Patient Gender: M               HR:           76 bpm. Exam Location:  Inpatient  Procedure: Limited Echo, Limited Color Doppler and Cardiac Doppler Indications:    Cardiomyopathy 425.9  History:        Patient has prior history of Echocardiogram examinations, most                 recent 10/27/2019. CHF, COPD and chronic kidney disease; Risk                 Factors:Hypertension, Dyslipidemia, Diabetes and cocaine use.  Sonographer:    Johny Chess Referring Phys: [id number] [place]  1. Diffuse hypokinesis with more pronounced hypokinesis in inferior/lateral walls. Left ventricular ejection fraction, by estimation, is 30 to 35%. The left ventricle has moderately decreased function. The left ventricle demonstrates global hypokinesis.  2. The mitral valve is normal in structure. Trivial mitral valve regurgitation.  3. The aortic valve is grossly normal. Aortic valve regurgitation is not visualized. No aortic stenosis is present.  4. The inferior vena cava is dilated in size with >50% respiratory variability, suggesting right atrial pressure of 8 mmHg. Comparison(s): No significant change from prior study. FINDINGS  Left Ventricle: Diffuse hypokinesis with more pronounced hypokinesis in inferior/lateral walls. Left ventricular ejection fraction, by estimation, is 30 to 35%. The left ventricle has moderately decreased function. The left ventricle demonstrates global  hypokinesis. Mitral Valve: The mitral valve is normal in structure. Trivial mitral valve regurgitation. Tricuspid Valve: The tricuspid valve is normal in structure. Tricuspid valve regurgitation is trivial. Aortic Valve: The aortic valve is grossly normal. Aortic valve regurgitation is not visualized. No aortic stenosis is present. Pulmonic Valve: The pulmonic valve was not well visualized. Pulmonic valve regurgitation is not visualized. Venous: The inferior vena cava is dilated in size with greater than 50% respiratory variability, suggesting right atrial pressure of 8 mmHg.  LEFT VENTRICLE PLAX 2D LVIDd:          6.40 cm  Diastology LVIDs:         5.10 cm  LV e' lateral:   5.66 cm/s LV PW:         1.30 cm  LV E/e' lateral: 13.0 LV IVS:        1.00  cm  LV e' medial:    4.35 cm/s LVOT diam:     2.30 cm  LV E/e' medial:  16.9 LV SV:         79 LV SV Index:   35 LVOT Area:     4.15 cm  IVC IVC diam: 2.30 cm LEFT ATRIUM         Index LA diam:    4.00 cm 1.76 cm/m  AORTIC VALVE LVOT Vmax:   123.00 cm/s LVOT Vmean:  71.200 cm/s LVOT VTI:    0.190 m  AORTA Ao Root diam: 4.00 cm MITRAL VALVE MV Area (PHT): 3.03 cm    SHUNTS MV Decel Time: 250 msec    Systemic VTI:  0.19 m MV E velocity: 73.70 cm/s  Systemic Diam: 2.30 cm MV A velocity: 72.00 cm/s MV E/A ratio:  1.02 Buford Dresser MD Electronically signed by Buford Dresser MD Signature Date/Time: 12/25/2019/7:33:36 PM    Final       Subjective: He is breathing better.   Discharge Exam: Vitals:   12/27/19 0628 12/27/19 0756  BP: 131/83 (!) 141/99  Pulse: 80   Resp: 20 20  Temp: 98.1 F (36.7 C) 98.2 F (36.8 C)  SpO2: 97% 98%     General: Pt is alert, awake, not in acute distress Cardiovascular: RRR, S1/S2 +, no rubs, no gallops Respiratory: CTA bilaterally, no wheezing, no rhonchi Abdominal: Soft, NT, ND, bowel sounds + Extremities: no edema, no cyanosis    The results of significant diagnostics from this hospitalization (including imaging, microbiology, ancillary and laboratory) are listed below for reference.     Microbiology: Recent Results (from the past 240 hour(s))  SARS Coronavirus 2 by RT PCR (hospital order, performed in [facility] hospital lab) Nasopharyngeal Nasopharyngeal Swab     Status: None   Collection Time: 12/24/19 11:17 PM   Specimen: Nasopharyngeal Swab  Result Value Ref Range Status   SARS Coronavirus 2 NEGATIVE NEGATIVE Final    Comment: (NOTE) SARS-CoV-2 target nucleic acids are NOT DETECTED.  The SARS-CoV-2 RNA is generally detectable in upper and lower respiratory specimens during the acute phase of  infection. The lowest concentration of SARS-CoV-2 viral copies this assay can detect is 250 copies / mL. A negative result does not preclude SARS-CoV-2 infection and should not be used as the sole basis for treatment or other patient management decisions.  A negative result may occur with improper specimen collection / handling, submission of specimen other than nasopharyngeal swab, presence of viral mutation(s) within the areas targeted by this assay, and inadequate number of viral copies (<250 copies / mL). A negative result must be combined with clinical observations, patient history, and epidemiological information.  Fact Sheet for Patients:   StrictlyIdeas.no  Fact Sheet for Healthcare Providers: BankingDealers.co.za  This test is not yet approved or  cleared by the [place] FDA and has been authorized for detection and/or diagnosis of SARS-CoV-2 by FDA under an Emergency Use Authorization (EUA).  This EUA will remain in effect (meaning this test can be used) for the duration of the COVID-19 declaration under Section 564(b)(1) of the Act, 21 U.S.C. section 360bbb-3(b)(1), unless the authorization is terminated or revoked sooner.  Performed at [facility], [place] [address], [place], [place] [address]   MRSA PCR Screening     Status: None   Collection Time: 12/25/19  6:32 AM   Specimen: Nasal Mucosa; Nasopharyngeal  Result Value Ref Range Status   MRSA by PCR NEGATIVE  NEGATIVE Final    Comment:        The GeneXpert MRSA Assay (FDA approved for NASAL specimens only), is one component of a comprehensive MRSA colonization surveillance program. It is not intended to diagnose MRSA infection nor to guide or monitor treatment for MRSA infections. Performed at [facility], [place] [address], [place], [place] [address]      Labs: BNP (last 3 results) Recent Labs    10/26/19 1527 12/24/19 2218  BNP 1,823.3*  7,290.2*   Basic Metabolic Panel: Recent Labs  Lab 12/24/19 2220 12/25/19 0239 12/25/19 0522 12/25/19 1144 12/26/19 0302 12/27/19 0603  NA 140 142  --   --  137 136  K 3.9 4.1  --   --  4.2 4.0  CL 108  --   --   --  102 98  CO2 24  --   --   --  23 27  GLUCOSE 73  --   --   --  164* 114*  BUN 30*  --   --   --  31* 40*  CREATININE 2.07*  --  1.98*  --  1.83* 2.20*  CALCIUM 8.5*  --   --   --  8.4* 8.5*  MG  --   --   --  1.9  --   --    Liver Function Tests: No results for input(s): AST, ALT, ALKPHOS, BILITOT, PROT, ALBUMIN in the last 168 hours. No results for input(s): LIPASE, AMYLASE in the last 168 hours. No results for input(s): AMMONIA in the last 168 hours. CBC: Recent Labs  Lab 12/24/19 2220 12/25/19 0239 12/25/19 0632  WBC 10.0  --  7.9  HGB 15.1 16.7 16.7  HCT 46.9 49.0 52.3*  MCV 91.4  --  91.4  PLT 190  --  196   Cardiac Enzymes: No results for input(s): CKTOTAL, CKMB, CKMBINDEX, TROPONINI in the last 168 hours. BNP: Invalid input(s): POCBNP CBG: Recent Labs  Lab 12/26/19 0745 12/26/19 1253 12/26/19 1730 12/26/19 2039 12/27/19 0602  GLUCAP 138* 144* 191* 116* 122*   D-Dimer No results for input(s): DDIMER in the last 72 hours. Hgb A1c Recent Labs    12/25/19 0632  HGBA1C 6.8*   Lipid Profile Recent Labs    12/25/19 0632  CHOL 176  HDL 37*  LDLCALC 121*  TRIG 91  CHOLHDL 4.8   Thyroid function studies No results for input(s): TSH, T4TOTAL, T3FREE, THYROIDAB in the last 72 hours.  Invalid input(s): FREET3 Anemia work up No results for input(s): VITAMINB12, FOLATE, FERRITIN, TIBC, IRON, RETICCTPCT in the last 72 hours. Urinalysis    Component Value Date/Time   COLORURINE STRAW (A) 08/10/2018 2230   APPEARANCEUR Clear 11/30/2018 1035   LABSPEC 1.005 08/10/2018 2230   PHURINE 6.0 08/10/2018 2230   GLUCOSEU Negative 11/30/2018 1035   HGBUR LARGE (A) 08/10/2018 2230   BILIRUBINUR Negative 11/30/2018 1035   KETONESUR NEGATIVE  08/10/2018 2230   PROTEINUR 1+ (A) 11/30/2018 1035   PROTEINUR 100 (A) 08/10/2018 2230   NITRITE Negative 11/30/2018 1035   NITRITE NEGATIVE 08/10/2018 2230   LEUKOCYTESUR Negative 11/30/2018 1035   LEUKOCYTESUR NEGATIVE 08/10/2018 2230   Sepsis Labs Invalid input(s): PROCALCITONIN,  WBC,  LACTICIDVEN Microbiology Recent Results (from the past 240 hour(s))  SARS Coronavirus 2 by RT PCR (hospital order, performed in [facility] hospital lab) Nasopharyngeal Nasopharyngeal Swab     Status: None   Collection Time: 12/24/19 11:17 PM   Specimen: Nasopharyngeal Swab  Result Value Ref Range Status   SARS Coronavirus 2 NEGATIVE NEGATIVE Final    Comment: (NOTE) SARS-CoV-2 target nucleic acids are NOT DETECTED.  The SARS-CoV-2 RNA is generally detectable in upper and lower respiratory specimens during the acute phase of infection. The lowest concentration of SARS-CoV-2 viral copies this assay can detect is 250 copies / mL. A negative result does not preclude SARS-CoV-2 infection and should not be used as the sole basis for treatment or other patient management decisions.  A negative result may occur with improper specimen collection / handling, submission of specimen other than nasopharyngeal swab, presence of viral mutation(s) within the areas targeted by this assay, and inadequate number of viral copies (<250 copies / mL). A negative result must be combined with clinical observations, patient history, and epidemiological information.  Fact Sheet for Patients:   StrictlyIdeas.no  Fact Sheet for Healthcare Providers: BankingDealers.co.za  This test is not yet approved or  cleared by the [place] FDA and has been authorized for detection and/or diagnosis of SARS-CoV-2 by FDA under an Emergency Use Authorization (EUA).  This EUA will remain in effect (meaning this test can be used) for the duration of the COVID-19 declaration under  Section 564(b)(1) of the Act, 21 U.S.C. section 360bbb-3(b)(1), unless the authorization is terminated or revoked sooner.  Performed at [facility], [place] [address] [place], [place] [address]   MRSA PCR Screening     Status: None   Collection Time: 12/25/19  6:32 AM   Specimen: Nasal Mucosa; Nasopharyngeal  Result Value Ref Range Status   MRSA by PCR NEGATIVE NEGATIVE Final    Comment:        The GeneXpert MRSA Assay (FDA approved for NASAL specimens only), is one component of a comprehensive MRSA colonization surveillance program. It is not intended to diagnose MRSA infection nor to guide or monitor treatment for MRSA infections. Performed at [facility], [place] [address], [place], [place] [address]      Time coordinating discharge: 40 minutes  SIGNED:   Elmarie Shiley, MD  [facility]

## 2019-12-27 NOTE — Plan of Care (Signed)

## 2019-12-27 NOTE — Consult Note (Signed)
[facility] Inpatient Consult   12/27/2019  Chris [name] 01/29/1961 [id number]   Managed Medicaid: [employer]  Referral request for review for possible homelessness needs from inpatient Transition of Care, [place].  Patient had been given resources. Met with the patient at bedside to review request. HIPAA verified.  Patient states his best contact number is [phone].  He endorses [facility] as his primary care providers.  Patient is listed to have TOC follow up with this primary office. Patient states, "I just want to get out of the situation that I am living in and was hoping to get some help to find another place to live."  Patient states he has resource and contact information from inpatient [facility] staff. He states he has disability however he has been "turned down for Section 8 housing." How can I go to a shelter with all of this [pointing at oxygen tank]?" He had oxygen tank in the corner, however not wearing any oxygen during conversation. Plan: Patient will have a follow up with [facility] Social Worker. Will follow up with Erskine Squibb with [facility] and alert Erskine Squibb of patient's need.  Charlesetta Shanks, RN BSN CCM [facility]  [phone] business mobile phone Toll free office [phone]  Fax number: [phone] [name]

## 2019-12-27 NOTE — Discharge Instructions (Signed)

## 2019-12-28 ENCOUNTER — Encounter: Payer: Self-pay | Admitting: Internal Medicine

## 2019-12-28 ENCOUNTER — Other Ambulatory Visit: Payer: Self-pay

## 2019-12-28 ENCOUNTER — Telehealth: Payer: Self-pay

## 2019-12-28 ENCOUNTER — Other Ambulatory Visit: Payer: Self-pay | Admitting: *Deleted

## 2019-12-28 ENCOUNTER — Ambulatory Visit (HOSPITAL_BASED_OUTPATIENT_CLINIC_OR_DEPARTMENT_OTHER): Payer: Medicaid Other | Admitting: Licensed Clinical Social Worker

## 2019-12-28 ENCOUNTER — Ambulatory Visit: Payer: Medicaid Other | Attending: Internal Medicine | Admitting: Internal Medicine

## 2019-12-28 ENCOUNTER — Telehealth: Payer: Self-pay | Admitting: *Deleted

## 2019-12-28 VITALS — BP 118/69 | HR 73 | Resp 16 | Wt 276.4 lb

## 2019-12-28 DIAGNOSIS — F1410 Cocaine abuse, uncomplicated: Secondary | ICD-10-CM

## 2019-12-28 DIAGNOSIS — I5042 Chronic combined systolic (congestive) and diastolic (congestive) heart failure: Secondary | ICD-10-CM | POA: Diagnosis not present

## 2019-12-28 DIAGNOSIS — J9611 Chronic respiratory failure with hypoxia: Secondary | ICD-10-CM | POA: Diagnosis not present

## 2019-12-28 DIAGNOSIS — Z09 Encounter for follow-up examination after completed treatment for conditions other than malignant neoplasm: Secondary | ICD-10-CM

## 2019-12-28 DIAGNOSIS — J439 Emphysema, unspecified: Secondary | ICD-10-CM

## 2019-12-28 DIAGNOSIS — Z599 Problem related to housing and economic circumstances, unspecified: Secondary | ICD-10-CM

## 2019-12-28 DIAGNOSIS — F17200 Nicotine dependence, unspecified, uncomplicated: Secondary | ICD-10-CM

## 2019-12-28 DIAGNOSIS — Z9981 Dependence on supplemental oxygen: Secondary | ICD-10-CM

## 2019-12-28 DIAGNOSIS — E119 Type 2 diabetes mellitus without complications: Secondary | ICD-10-CM

## 2019-12-28 MED ORDER — TRUE METRIX BLOOD GLUCOSE TEST VI STRP: ORAL_STRIP | 12 refills | Status: DC

## 2019-12-28 MED ORDER — ALLOPURINOL 100 MG PO TABS: 100.0000 mg | ORAL_TABLET | Freq: Every day | ORAL | 3 refills | Status: DC

## 2019-12-28 MED ORDER — TRUEPLUS LANCETS 28G MISC: 4 refills | Status: DC

## 2019-12-28 MED ORDER — BUDESONIDE-FORMOTEROL FUMARATE 160-4.5 MCG/ACT IN AERO: 2.0000 | INHALATION_SPRAY | Freq: Two times a day (BID) | RESPIRATORY_TRACT | 3 refills | Status: DC

## 2019-12-28 MED ORDER — TRUE METRIX METER W/DEVICE KIT: PACK | 0 refills | Status: DC

## 2019-12-28 MED ORDER — SACUBITRIL-VALSARTAN 24-26 MG PO TABS: 1.0000 | ORAL_TABLET | Freq: Two times a day (BID) | ORAL | 1 refills | Status: DC

## 2019-12-28 MED FILL — SYMBICORT 160-4.5 MCG INH: 160-4.5 | 30 days supply | Qty: 10 | Fill #0

## 2019-12-28 MED FILL — ENTRESTO 24 MG-26 MG TABLET: 24-26 | 30 days supply | Qty: 60 | Fill #0

## 2019-12-28 NOTE — Patient Instructions (Signed)
Try to check your blood sugars at least once a day before meals.  The goal is to keep the blood sugars between 80-130 before meals.  If the blood sugars drop too low, you may feel shaky or dizzy.  This let you know that you need to eat something immediately to get your blood sugars back up.   Hypoglycemia Hypoglycemia is when the sugar (glucose) level in your blood is too low. Signs of low blood sugar may include:  Feeling: ? Hungry. ? Worried or nervous (anxious). ? Sweaty and clammy. ? Confused. ? Dizzy. ? Sleepy. ? Sick to your stomach (nauseous).  Having: ? A fast heartbeat. ? A headache. ? A change in your vision. ? Tingling or no feeling (numbness) around your mouth, lips, or tongue. ? Jerky movements that you cannot control (seizure).  Having trouble with: ? Moving (coordination). ? Sleeping. ? Passing out (fainting). ? Getting upset easily (irritability). Low blood sugar can happen to people who have diabetes and people who do not have diabetes. Low blood sugar can happen quickly, and it can be an emergency. Treating low blood sugar Low blood sugar is often treated by eating or drinking something sugary right away, such as:  Fruit juice, 4-6 oz (120-150 mL).  Regular soda (not diet soda), 4-6 oz (120-150 mL).  Low-fat milk, 4 oz (120 mL).  Several pieces of hard candy.  Sugar or honey, 1 Tbsp (15 mL). Treating low blood sugar if you have diabetes If you can think clearly and swallow safely, follow the 15:15 rule:  Take 15 grams of a fast-acting carb (carbohydrate). Talk with your doctor about how much you should take.  Always keep a source of fast-acting carb with you, such as: ? Sugar tablets (glucose pills). Take 3-4 pills. ? 6-8 pieces of hard candy. ? 4-6 oz (120-150 mL) of fruit juice. ? 4-6 oz (120-150 mL) of regular (not diet) soda. ? 1 Tbsp (15 mL) honey or sugar.  Check your blood sugar 15 minutes after you take the carb.  If your blood sugar is  still at or below 70 mg/dL (3.9 mmol/L), take 15 grams of a carb again.  If your blood sugar does not go above 70 mg/dL (3.9 mmol/L) after 3 tries, get help right away.  After your blood sugar goes back to normal, eat a meal or a snack within 1 hour.  Treating very low blood sugar If your blood sugar is at or below 54 mg/dL (3 mmol/L), you have very low blood sugar (severe hypoglycemia). This may also cause:  Passing out.  Jerky movements you cannot control (seizure).  Losing consciousness (coma). This is an emergency. Do not wait to see if the symptoms will go away. Get medical help right away. Call your local emergency services (911 in the [place] Do not drive yourself to the hospital. If you have very low blood sugar and you cannot eat or drink, you may need a glucagon shot (injection). A family member or friend should learn how to check your blood sugar and how to give you a glucagon shot. Ask your doctor if you need to have a glucagon shot kit at home. Follow these instructions at home: General instructions  Take over-the-counter and prescription medicines only as told by your doctor.  Stay aware of your blood sugar as told by your doctor.  Limit alcohol intake to no more than 1 drink a day for nonpregnant women and 2 drinks a day for men. One drink  equals 12 oz of beer (355 mL), 5 oz of wine (148 mL), or 1 oz of hard liquor (44 mL).  Keep all follow-up visits as told by your doctor. This is important. If you have diabetes:   Follow your diabetes care plan as told by your doctor. Make sure you: ? Know the signs of low blood sugar. ? Take your medicines as told. ? Follow your exercise and meal plan. ? Eat on time. Do not skip meals. ? Check your blood sugar as often as told by your doctor. Always check it before and after exercise. ? Follow your sick day plan when you cannot eat or drink normally. Make this plan ahead of time with your doctor.  Share your diabetes care plan  with: ? Your work or school. ? People you live with.  Check your pee (urine) for ketones: ? When you are sick. ? As told by your doctor.  Carry a card or wear jewelry that says you have diabetes. Contact a doctor if:  You have trouble keeping your blood sugar in your target range.  You have low blood sugar often. Get help right away if:  You still have symptoms after you eat or drink something sugary.  Your blood sugar is at or below 54 mg/dL (3 mmol/L).  You have jerky movements that you cannot control.  You pass out. These symptoms may be an emergency. Do not wait to see if the symptoms will go away. Get medical help right away. Call your local emergency services (911 in the [place] Do not drive yourself to the hospital. Summary  Hypoglycemia happens when the level of sugar (glucose) in your blood is too low.  Low blood sugar can happen to people who have diabetes and people who do not have diabetes. Low blood sugar can happen quickly, and it can be an emergency.  Make sure you know the signs of low blood sugar and know how to treat it.  Always keep a source of sugar (fast-acting carb) with you to treat low blood sugar. This information is not intended to replace advice given to you by your health care provider. Make sure you discuss any questions you have with your health care provider. Document Revised: 09/21/2018 Document Reviewed: 07/04/2015 Elsevier Patient Education  2020 [employer].

## 2019-12-28 NOTE — Telephone Encounter (Signed)
Managed Medicaid Transitional Care Management Assessment undertaken by [place] Management team today with telephone outreach by LCSW.   [place] Management Coordinator Direct Dial:  [phone]  Fax: [phone]

## 2019-12-28 NOTE — Patient Outreach (Signed)
[facility]) Care Management  12/28/2019  Chris [name] January 10, 1961 [id number]   CSW received referral on 12/27/2019 for post hospital follow up regarding housing/homelessness.  CSW attempted an initial outreach to pt today and was unable to speak with pt.  CSW left a HIPPA compliant voice message and will send an Unsuccessful Outreach letter as well as plan to try outreach again next week/Monday if no return call is received.   Eduard Clos, MSW, [place] Worker  [place] [phone]

## 2019-12-28 NOTE — Telephone Encounter (Signed)
Pt is scheduled to see Dr Wynetta Emery today for TCC.

## 2019-12-28 NOTE — Telephone Encounter (Signed)
PRIOR AUTH FOR ENTRESTO APPROVED THRU 12/27/20

## 2019-12-28 NOTE — Progress Notes (Signed)
Patient ID: Chris [name], male    DOB: 1961/01/05  MRN: [id number]  CC: Transition of [facility] admission date: 712-15/2021  Subjective: Chris [name] is a 59 y.o. male who presents for transition of care visit. His concerns today include: Pt with hx of NICM, combined CHF, HTN, HL,CKD 3b,gout, tob dep,subst abuse,  COPD.  Patient presented to the hospital with complaints of progressive shortness of breath, hypertension and altered mental status.  Patient found to be hypoxic on admission with O2 sats in the 80s.  Chest x-ray showed pulmonary vascular congestion.  BNP and troponin were elevated.  Urine drug screen positive for cocaine.  Assessed to have hypercapnic hypoxic respiratory failure and was admitted to ICU on BiPAP.  Blood pressure was controlled with IV medication.  He was transitioned to oral medications once he improved and was sent home on home O2.  He was found to be diabetic with A1c of 6.8.  He was started on low-dose glipizide upon discharge.  Today: CHF: Reports he is doing better since discharge yesterday.   Admits that he was not compliant with taking his medications after he saw me in May.   He still gets winded with exertion.  He has not had any lower extremity edema.  He reports compliance with medications but there are several medicines that he does not have with him today including Entresto, albuterol inhaler, allopurinol and Symbicort.  He admits that he was still using cocaine after his last visit with me.  He was supposed to get into an outpatient treatment class. -He was sent home on oxygen 2 L.  He is not sure whether he is supposed to use it continuously or not.  He does not have it with him today.  He told me that it is in his car.  Tobacco dependence: He has the patches and plans to use them.  In regards to the new diagnosis of diabetes, his A1c was 6.8.  He does not have diabetic testing supplies but would like to get them.  Patient Active Problem  List   Diagnosis Date Noted  . Type 2 diabetes mellitus with stage 3 chronic kidney disease ([place]) 12/25/2019  . Acute and chronic respiratory failure (acute-on-chronic) ([place]) 12/25/2019  . Acute on chronic combined systolic and diastolic CHF (congestive heart failure) ([place]) 10/26/2019  . Elevated troponin I level 10/26/2019  . Acute on chronic diastolic (congestive) heart failure ([place]) 10/26/2019  . History of gout 02/01/2019  . Seasonal allergic rhinitis due to pollen 02/01/2019  . Tobacco dependence 11/30/2018  . Microscopic hematuria 11/30/2018  . Depression 11/30/2018  . Difficulty controlling anger 11/30/2018  . COPD (chronic obstructive pulmonary disease) ([place])   . CKD (chronic kidney disease) stage 3, GFR 30-59 ml/min 08/10/2018  . Recurrent epistaxis 04/21/2018  . Mixed hyperlipidemia 07/28/2017  . Essential hypertension 07/28/2017  . Chronic systolic heart failure ([place]) 10/25/2014  . Cocaine abuse ([place]) 02/20/2013  . Cannabis abuse 02/20/2013  . Back pain, chronic 02/20/2013     Current Outpatient Medications on File Prior to Visit  Medication Sig Dispense Refill  . albuterol (PROVENTIL) (2.5 MG/3ML) 0.083% nebulizer solution Take 3 mLs by nebulization every 6 (six) hours as needed for shortness of breath. 75 mL 1  . albuterol (VENTOLIN HFA) 108 (90 Base) MCG/ACT inhaler Inhale 2 puffs into the lungs every 6 (six) hours as needed for wheezing or shortness of breath. 18 g 1  . allopurinol (ZYLOPRIM) 100 MG tablet Take 100 mg  by mouth daily.    [name] aspirin EC 81 MG EC tablet Take 1 tablet (81 mg total) by mouth daily. 30 tablet 0  . atorvastatin (LIPITOR) 40 MG tablet Take 1 tablet (40 mg total) by mouth daily at 6 PM. 30 tablet 2  . azithromycin (ZITHROMAX) 500 MG tablet Take 1 tablet (500 mg total) by mouth daily. 2 tablet 0  . blood glucose meter kit and supplies Dispense based on patient and insurance preference. Use up to four times daily as directed. (FOR ICD-10 E10.9,  E11.9). 1 each 0  . budesonide-formoterol (SYMBICORT) 160-4.5 MCG/ACT inhaler Inhale 2 puffs into the lungs 2 (two) times daily. 1 Inhaler 3  . carvedilol (COREG) 25 MG tablet Take 1 tablet (25 mg total) by mouth 2 (two) times daily with a meal. 120 tablet 1  . fluticasone (FLONASE) 50 MCG/ACT nasal spray Place 1 spray into both nostrils daily as needed for allergies or rhinitis. 16 g 1  . furosemide (LASIX) 40 MG tablet Take 1.5 tablets (60 mg total) by mouth daily. 45 tablet 6  . glipiZIDE (GLUCOTROL) 5 MG tablet Take 0.5 tablets (2.5 mg total) by mouth 2 (two) times daily before a meal. 30 tablet 2  . glipiZIDE (GLUCOTROL) 5 MG tablet Take 0.5 tablets (2.5 mg total) by mouth 2 (two) times daily before a meal. 30 tablet 2  . glucose blood (ACCU-CHEK GUIDE) test strip Use as instructed. Check blood glucose by fingerstick once per day. 100 each 12  . isosorbide-hydrALAZINE (BIDIL) 20-37.5 MG tablet Take 1 tablet by mouth 3 (three) times daily. 90 tablet 1  . Lancet Devices (LANCING DEVICE) MISC 1 Device by Does not apply route daily. 1 each 0  . montelukast (SINGULAIR) 10 MG tablet Take 1 tablet (10 mg total) by mouth at bedtime. 90 tablet 1  . nicotine (NICODERM CQ - DOSED IN MG/24 HOURS) 21 mg/24hr patch Place 1 patch (21 mg total) onto the skin daily. 28 patch 0  . predniSONE (DELTASONE) 20 MG tablet Take 1 tablet (20 mg total) by mouth daily with breakfast. 2 tablet 0  . sacubitril-valsartan (ENTRESTO) 24-26 MG Take 1 tablet by mouth 2 (two) times daily. 60 tablet 1  . traZODone (DESYREL) 50 MG tablet Take 0.5-1 tablets (25-50 mg total) by mouth at bedtime as needed for sleep. 30 tablet 3   No current facility-administered medications on file prior to visit.    No Known Allergies  Social History   Socioeconomic History  . Marital status: Single    Spouse name: Not on file  . Number of children: Not on file  . Years of education: Not on file  . Highest education level: Not on file   Occupational History  . Not on file  Tobacco Use  . Smoking status: Current Every Day Smoker    Packs/day: 1.00    Years: 20.00    Pack years: 20.00    Types: Cigarettes  . Smokeless tobacco: Never Used  . Tobacco comment: 3/4 of a pack  Vaping Use  . Vaping Use: Never used  Substance and Sexual Activity  . Alcohol use: No  . Drug use: Yes    Frequency: 21.0 times per week    Types: Marijuana, Cocaine    Comment: no longer- Cocaine  . Sexual activity: Not on file  Other Topics Concern  . Not on file  Social History Narrative   ** Merged History Encounter **       Social Determinants of  Health   Financial Resource Strain:   . Difficulty of Paying Living Expenses:   Food Insecurity:   . Worried About Charity fundraiser in the Last Year:   . Arboriculturist in the Last Year:   Transportation Needs:   . Film/video editor (Medical):   [name] Lack of Transportation (Non-Medical):   Physical Activity:   . Days of Exercise per Week:   . Minutes of Exercise per Session:   Stress:   . Feeling of Stress :   Social Connections:   . Frequency of Communication with Friends and Family:   . Frequency of Social Gatherings with Friends and Family:   . Attends Religious Services:   . Active Member of Clubs or Organizations:   . Attends Archivist Meetings:   [name] Marital Status:   Intimate Partner Violence:   . Fear of Current or Ex-Partner:   . Emotionally Abused:   [name] Physically Abused:   . Sexually Abused:     Family History  Problem Relation Age of Onset  . Heart disease Father     Past Surgical History:  Procedure Laterality Date  . ANKLE SURGERY    . CARDIAC CATHETERIZATION    . SHOULDER SURGERY      ROS: Review of Systems Negative except as stated above  PHYSICAL EXAM: BP 118/69   Pulse 73   Resp 16   Wt 276 lb 6.4 oz (125.4 kg)   SpO2 95%   BMI 36.47 kg/m   Physical Exam  Pulse ox room air at rest 96%.  Pulse ox room air during exertion  92-94%. General appearance - alert, well appearing, and in no distress Mental status - normal mood, behavior, speech, dress, motor activity, and thought processes Neck - supple, no significant adenopathy Chest - clear to auscultation, no wheezes, rales or rhonchi, symmetric air entry Heart - normal rate, regular rhythm, normal S1, S2, no murmurs, rubs, clicks or gallops Extremities - peripheral pulses normal, no pedal edema, no clubbing or cyanosis  Depression screen [facility] 2/9 11/01/2019 09/11/2019 07/09/2019  Decreased Interest 2 0 1  Down, Depressed, Hopeless 3 1 1   PHQ - 2 Score 5 1 2   Altered sleeping 3 3 0  Tired, decreased energy 2 0 1  Change in appetite - 0 1  Feeling bad or failure about yourself  2 0 0  Trouble concentrating 2 0 0  Moving slowly or fidgety/restless - 0 0  Suicidal thoughts 0 0 0  PHQ-9 Score 14 4 4   Difficult doing work/chores - - -    Lab Results  Component Value Date   HGBA1C 6.8 (H) 12/25/2019    CMP Latest Ref Rng & Units 12/27/2019 12/26/2019 12/25/2019  Glucose 70 - 99 mg/dL 114(H) 164(H) -  BUN 6 - 20 mg/dL 40(H) 31(H) -  Creatinine 0.61 - 1.24 mg/dL 2.20(H) 1.83(H) 1.98(H)  Sodium 135 - 145 mmol/L 136 137 142  Potassium 3.5 - 5.1 mmol/L 4.0 4.2 4.1  Chloride 98 - 111 mmol/L 98 102 -  CO2 22 - 32 mmol/L 27 23 -  Calcium 8.9 - 10.3 mg/dL 8.5(L) 8.4(L) -  Total Protein 6.0 - 8.5 g/dL - - -  Total Bilirubin 0.0 - 1.2 mg/dL - - -  Alkaline Phos 48 - 121 IU/L - - -  AST 0 - 40 IU/L - - -  ALT 0 - 44 IU/L - - -   Lipid Panel     Component Value  Date/Time   CHOL 176 12/25/2019 0632   CHOL 160 11/07/2019 0918   CHOL 157 10/21/2013 0435   TRIG 91 12/25/2019 0632   TRIG 117 10/21/2013 0435   HDL 37 (L) 12/25/2019 0632   HDL 40 11/07/2019 0918   HDL 30 (L) 10/21/2013 0435   CHOLHDL 4.8 12/25/2019 0632   VLDL 18 12/25/2019 0632   VLDL 23 10/21/2013 0435   LDLCALC 121 (H) 12/25/2019 0632   LDLCALC 92 11/07/2019 0918   LDLCALC 104 (H) 10/21/2013  0435    CBC    Component Value Date/Time   WBC 7.9 12/25/2019 0632   RBC 5.72 12/25/2019 0632   HGB 16.7 12/25/2019 0632   HGB 15.4 11/07/2019 0918   HCT 52.3 (H) 12/25/2019 0632   HCT 46.2 11/07/2019 0918   PLT 196 12/25/2019 0632   PLT 245 11/07/2019 0918   MCV 91.4 12/25/2019 0632   MCV 90 11/07/2019 0918   MCV 92 10/21/2013 0435   MCH 29.2 12/25/2019 0632   MCHC 31.9 12/25/2019 0632   RDW 15.9 (H) 12/25/2019 0632   RDW 14.2 11/07/2019 0918   RDW 15.7 (H) 10/21/2013 0435   LYMPHSABS 2.0 10/27/2019 0357   LYMPHSABS 1.1 10/21/2013 0435   MONOABS 0.8 10/27/2019 0357   MONOABS 0.6 10/21/2013 0435   EOSABS 0.2 10/27/2019 0357   EOSABS 0.0 10/21/2013 0435   BASOSABS 0.1 10/27/2019 0357   BASOSABS 0.0 10/21/2013 0435    ASSESSMENT AND PLAN: 1. Hospital discharge follow-up 2. Chronic combined systolic and diastolic CHF (congestive heart failure) ([place]) Discussed on encourage compliance with medications including furosemide, BiDil, Entresto and carvedilol.  I have resubmitted referral to try to get him in with heart failure clinic.  Stressed the importance of giving up street drugs - sacubitril-valsartan (ENTRESTO) 24-26 MG; Take 1 tablet by mouth 2 (two) times daily.  Dispense: 60 tablet; Refill: 1 - Ambulatory referral to Cardiology  3. Chronic respiratory failure with hypoxia, on home oxygen therapy (HCC) His O2 level today at rest and with exertion is not at the level where he would need continuous O2.  We will recheck him again on next visit and if oxygen level improves more with ambulation, we should be able to have him return the oxygen to the supplier.  4. Tobacco dependence Advised to quit.  He plans to start the nicotine patches  5. Cocaine abuse ([place]) Strongly advised to quit.  LCSW to see him today  6. New onset type 2 diabetes mellitus ([place]) Discussed diagnosis and management.  Went over signs and symptoms of hypoglycemia.  Prescription sent for diabetic testing  supplies.  Advised to check blood sugars at least once daily before meals with goal being 90-130. - glucose blood (TRUE METRIX BLOOD GLUCOSE TEST) test strip; Use as instructed  Dispense: 100 each; Refill: 12 - Blood Glucose Monitoring Suppl (TRUE METRIX METER) w/Device KIT; Use as directed  Dispense: 1 kit; Refill: 0 - TRUEplus Lancets 28G MISC; Use as directed  Dispense: 100 each; Refill: 4  7. Pulmonary emphysema, unspecified emphysema type ([place]) - budesonide-formoterol (SYMBICORT) 160-4.5 MCG/ACT inhaler; Inhale 2 puffs into the lungs 2 (two) times daily.  Dispense: 1 Inhaler; Refill: 3    Patient was given the opportunity to ask questions.  Patient verbalized understanding of the plan and was able to repeat key elements of the plan.   No orders of the defined types were placed in this encounter.    Requested Prescriptions    No prescriptions requested  or ordered in this encounter    No follow-ups on file.  Karle Plumber, MD, FACP

## 2019-12-31 ENCOUNTER — Other Ambulatory Visit: Payer: Self-pay | Admitting: *Deleted

## 2019-12-31 NOTE — Patient Outreach (Signed)
[facility]) Care Management  12/31/2019  RIAN BUSCHE Oct 13, 1960 [id number]   CSW received a follow up call from pt.  CSW introduced self, role and reason for call. Per pt, he is homeless; staying at a family members temporarily and is need of options.  CSW received verbal consent from pt to make referrals through [facility] for housing support.  Pt advised to expect call from community based support to assist with housing options.    Pt was seen by his PCP on Friday, per his report.   CSW will follow up with pt for updates on the referrals and to further assess and assist.   Eduard Clos, MSW, [place] Worker  [place] [phone]

## 2019-12-31 NOTE — Progress Notes (Deleted)
Office Visit Note  Patient: Chris [name]             Date of Birth: 06/18/1960           MRN: [id number]             PCP: [name], MD Referring: [name], MD Visit Date: 01/14/2020 Occupation: @GUAROCC @  Subjective:  No chief complaint on file.   History of Present Illness: Chris [name] is a 59 y.o. male ***   Activities of Daily Living:  Patient reports morning stiffness for *** {minute/hour:19697}.   Patient {ACTIONS;DENIES/REPORTS:21021675::"Denies"} nocturnal pain.  Difficulty dressing/grooming: {ACTIONS;DENIES/REPORTS:21021675::"Denies"} Difficulty climbing stairs: {ACTIONS;DENIES/REPORTS:21021675::"Denies"} Difficulty getting out of chair: {ACTIONS;DENIES/REPORTS:21021675::"Denies"} Difficulty using hands for taps, buttons, cutlery, and/or writing: {ACTIONS;DENIES/REPORTS:21021675::"Denies"}  No Rheumatology ROS completed.   PMFS History:  Patient Active Problem List   Diagnosis Date Noted  . Chronic respiratory failure with hypoxia, on home oxygen therapy ([place]) 12/28/2019  . Type 2 diabetes mellitus with stage 3 chronic kidney disease ([place]) 12/25/2019  . Acute and chronic respiratory failure (acute-on-chronic) ([place]) 12/25/2019  . Acute on chronic combined systolic and diastolic CHF (congestive heart failure) ([place]) 10/26/2019  . Elevated troponin I level 10/26/2019  . Acute on chronic diastolic (congestive) heart failure ([place]) 10/26/2019  . History of gout 02/01/2019  . Seasonal allergic rhinitis due to pollen 02/01/2019  . Tobacco dependence 11/30/2018  . Microscopic hematuria 11/30/2018  . Depression 11/30/2018  . Difficulty controlling anger 11/30/2018  . COPD (chronic obstructive pulmonary disease) ([place])   . CKD (chronic kidney disease) stage 3, GFR 30-59 ml/min 08/10/2018  . Recurrent epistaxis 04/21/2018  . Mixed hyperlipidemia 07/28/2017  . Essential hypertension 07/28/2017  . Chronic systolic heart failure ([place]) 10/25/2014  .  Cocaine abuse ([place]) 02/20/2013  . Cannabis abuse 02/20/2013  . Back pain, chronic 02/20/2013    Past Medical History:  Diagnosis Date  . CHF (congestive heart failure) ([place])   . Chronic kidney disease   . COPD (chronic obstructive pulmonary disease) ([place])   . Coronary artery disease   . Depression   . Diabetes mellitus without complication ([place])   . GERD (gastroesophageal reflux disease)   . Gout   . Hypertension   . Influenza A with respiratory manifestations   . Mental disorder     Family History  Problem Relation Age of Onset  . Heart disease Father    Past Surgical History:  Procedure Laterality Date  . ANKLE SURGERY    . CARDIAC CATHETERIZATION    . SHOULDER SURGERY     Social History   Social History Narrative   ** Merged History Encounter **       Immunization History  Administered Date(s) Administered  . Tdap 11/30/2018     Objective: Vital Signs: There were no vitals taken for this visit.   Physical Exam   Musculoskeletal Exam: ***  CDAI Exam: CDAI Score: -- Patient Global: --; Provider Global: -- Swollen: --; Tender: -- Joint Exam 01/14/2020   No joint exam has been documented for this visit   There is currently no information documented on the homunculus. Go to the Rheumatology activity and complete the homunculus joint exam.  Investigation: No additional findings.  Imaging: DG Chest Port 1 View  Result Date: 12/24/2019 CLINICAL DATA:  Dyspnea, hypoxia EXAM: PORTABLE CHEST 1 VIEW COMPARISON:  10/27/2019 FINDINGS: The left costophrenic angle is excluded from view on this portable semi-upright chest radiograph. The visualized lungs are clear. No pneumothorax. No pleural  effusion on the right. Moderate cardiomegaly is stable when compared to prior examination. There is central pulmonary vascular congestion again noted without overt pulmonary edema. IMPRESSION: Technically limited, but stable examination with unchanged cardiomegaly and central  pulmonary vascular congestion. Electronically Signed   By: Fidela Salisbury MD   On: 12/24/2019 22:50   ECHOCARDIOGRAM LIMITED  Result Date: 12/25/2019    ECHOCARDIOGRAM LIMITED REPORT   Patient Name:   Chris [name] Date of Exam: 12/25/2019 Medical Rec #:  [id number]       Height:       66.0 in Accession #:    [id number]      Weight:       271.8 lb Date of Birth:  Nov 11, 1960       BSA:          2.278 m Patient Age:    10 years        BP:           148/116 mmHg Patient Gender: M               HR:           76 bpm. Exam Location:  Inpatient Procedure: Limited Echo, Limited Color Doppler and Cardiac Doppler Indications:    Cardiomyopathy 425.9  History:        Patient has prior history of Echocardiogram examinations, most                 recent 10/27/2019. CHF, COPD and chronic kidney disease; Risk                 Factors:Hypertension, Dyslipidemia, Diabetes and cocaine use.  Sonographer:    Johny Chess Referring Phys: [id number] [place]  1. Diffuse hypokinesis with more pronounced hypokinesis in inferior/lateral walls. Left ventricular ejection fraction, by estimation, is 30 to 35%. The left ventricle has moderately decreased function. The left ventricle demonstrates global hypokinesis.  2. The mitral valve is normal in structure. Trivial mitral valve regurgitation.  3. The aortic valve is grossly normal. Aortic valve regurgitation is not visualized. No aortic stenosis is present.  4. The inferior vena cava is dilated in size with >50% respiratory variability, suggesting right atrial pressure of 8 mmHg. Comparison(s): No significant change from prior study. FINDINGS  Left Ventricle: Diffuse hypokinesis with more pronounced hypokinesis in inferior/lateral walls. Left ventricular ejection fraction, by estimation, is 30 to 35%. The left ventricle has moderately decreased function. The left ventricle demonstrates global  hypokinesis. Mitral Valve: The mitral valve is normal in structure. Trivial  mitral valve regurgitation. Tricuspid Valve: The tricuspid valve is normal in structure. Tricuspid valve regurgitation is trivial. Aortic Valve: The aortic valve is grossly normal. Aortic valve regurgitation is not visualized. No aortic stenosis is present. Pulmonic Valve: The pulmonic valve was not well visualized. Pulmonic valve regurgitation is not visualized. Venous: The inferior vena cava is dilated in size with greater than 50% respiratory variability, suggesting right atrial pressure of 8 mmHg.  LEFT VENTRICLE PLAX 2D LVIDd:         6.40 cm  Diastology LVIDs:         5.10 cm  LV e' lateral:   5.66 cm/s LV PW:         1.30 cm  LV E/e' lateral: 13.0 LV IVS:        1.00 cm  LV e' medial:    4.35 cm/s LVOT diam:     2.30 cm  LV E/e' medial:  16.9  LV SV:         79 LV SV Index:   35 LVOT Area:     4.15 cm  IVC IVC diam: 2.30 cm LEFT ATRIUM         Index LA diam:    4.00 cm 1.76 cm/m  AORTIC VALVE LVOT Vmax:   123.00 cm/s LVOT Vmean:  71.200 cm/s LVOT VTI:    0.190 m  AORTA Ao Root diam: 4.00 cm MITRAL VALVE MV Area (PHT): 3.03 cm    SHUNTS MV Decel Time: 250 msec    Systemic VTI:  0.19 m MV E velocity: 73.70 cm/s  Systemic Diam: 2.30 cm MV A velocity: 72.00 cm/s MV E/A ratio:  1.02 Buford Dresser MD Electronically signed by Buford Dresser MD Signature Date/Time: 12/25/2019/7:33:36 PM    Final     Recent Labs: Lab Results  Component Value Date   WBC 7.9 12/25/2019   HGB 16.7 12/25/2019   PLT 196 12/25/2019   NA 136 12/27/2019   K 4.0 12/27/2019   CL 98 12/27/2019   CO2 27 12/27/2019   GLUCOSE 114 (H) 12/27/2019   BUN 40 (H) 12/27/2019   CREATININE 2.20 (H) 12/27/2019   BILITOT 0.3 11/07/2019   ALKPHOS 87 11/07/2019   AST 19 11/07/2019   ALT 24 11/07/2019   PROT 6.4 11/07/2019   ALBUMIN 3.8 11/07/2019   CALCIUM 8.5 (L) 12/27/2019   GFRAA 37 (L) 12/27/2019    Speciality Comments: No specialty comments available.  Procedures:  No procedures performed Allergies: Patient has  no known allergies.   Assessment / Plan:     Visit Diagnoses: No diagnosis found.  Orders: No orders of the defined types were placed in this encounter.  No orders of the defined types were placed in this encounter.   Face-to-face time spent with patient was *** minutes. Greater than 50% of time was spent in counseling and coordination of care.  Follow-Up Instructions: No follow-ups on file.   Ofilia Neas, PA-C  Note - This record has been created using Dragon software.  Chart creation errors have been sought, but may not always  have been located. Such creation errors do not reflect on  the standard of medical care.

## 2020-01-01 ENCOUNTER — Other Ambulatory Visit: Payer: Self-pay | Admitting: *Deleted

## 2020-01-01 NOTE — Patient Outreach (Signed)
[place] [facility]) Care Management  01/01/2020  Chris [name] [birth year] [id number]     CSW spoke with pt who reports he heard from the [facility] but "I forget what I was supposed to do"..  Pt admits to having trouble keeping up with things; stating; "I am slow when it comes to all this".  CSW reassured pt that we are here to help him, support and guide him along the way. CSW placed a call to [facility] rep to determine next steps and left a message and awaits callback.   Pt shared with CSW; "[place]".  CSW will continue to work diligently to seek housing assistance for him.   Eduard Clos, MSW, [place] Worker  [place] [phone]

## 2020-01-02 ENCOUNTER — Other Ambulatory Visit: Payer: Self-pay | Admitting: *Deleted

## 2020-01-03 NOTE — Patient Outreach (Signed)
[place] [facility]) Care Management  01/02/2020  SHANTI EICHEL Apr 02, 1961 [id number]   CSW is attempting to connect pt and [facility] Rep for housing assistance.  Pt reports he has not re-connected with [facility].  CSW has left 2 messages for [facility] Rep to assist with determining next steps for [facility] assistance.    Eduard Clos, MSW, [place] Worker  [place] [phone]

## 2020-01-04 ENCOUNTER — Other Ambulatory Visit: Payer: Self-pay | Admitting: *Deleted

## 2020-01-04 NOTE — Patient Outreach (Signed)
[place] [facility]) Care Management  01/04/2020  Chris [name] December 05, 1960 [id number]   CSW confirmed via Armida Sans, [facility]The Liaison, that pt will be assisted by the [facility] on 01/04/2020.  CSW has also spoken with pt who confirms plans to go to the [facility] today. He states he has a ride and is reminded to call if he does not.    CSW hopeful for emergency housing assistance to be arranged for pt today.   Eduard Clos, MSW, [place] Worker  [place] [phone]

## 2020-01-07 ENCOUNTER — Other Ambulatory Visit: Payer: Self-pay | Admitting: *Deleted

## 2020-01-07 NOTE — Patient Outreach (Signed)
[place] [facility]) Care Management  01/07/2020  Chris [name] 05/11/1961 [id number]   CSW contacted pt this morning for updates from previous week and plans for him to go to the [facility] and meet with staff there are willing to help pt with housing needs.  Per pt, he "is planning to get down there today" as he states he did not have a way to get there.  CSW reminded pt of our conversation on the day of planned [facility] visit to which he says; "Oh, I didn't know that".  CSW reiterated that I had told him this last week; multiple times, and thus we need to help [place] as we attempt to help him. Pt confirms his plan to go today.   CSW has also contacted the [facility] and have left a message for [place], staff rep.  Pt states he has a way to get there today.  CSW stressed that he call with any problems getting there.   Eduard Clos, MSW, [place] Worker  [place] [phone]

## 2020-01-07 NOTE — BH Specialist Note (Signed)
Integrated Behavioral Health Initial Visit  MRN: [id number] Name: Chris [name]  Number of [place] Clinician visits:: 1/6 Session Start time: 3:40 PM  Session End time: 3:50 PM Total time: 10  Type of Service: [place] Interpretor:No. Interpretor Name and Language: NA   Warm Hand Off Completed.       SUBJECTIVE: Chris [name] is a 59 y.o. male accompanied by self Patient was referred by Dr. Wynetta Emery for substance use. Patient reports the following symptoms/concerns: Pt reports substance use triggered by psychosocial stressors. Duration of problem: Ongoing; Severity of problem: NA  OBJECTIVE: Mood: Anxious and Affect: Appropriate Risk of harm to self or others: No plan to harm self or others  LIFE CONTEXT: Family and Social: Pt reports minimal support School/Work: Pt receives approximately $800 in SSI and medicaid Self-Care: Pt has hx of ongoing substance use Life Changes: Pt reports psychosocial stressors. Pt is interested in obtaining independent housing  GOALS ADDRESSED: Patient will: 1. Reduce symptoms of: anxiety, depression and stress 2. Increase knowledge and/or ability of: self-management skills  3. Demonstrate ability to: Increase healthy adjustment to current life circumstances, Increase adequate support systems for patient/family and Decrease self-medicating behaviors  INTERVENTIONS: Interventions utilized: Solution-Focused Strategies, Supportive Counseling and Link to [employer]  Standardized Assessments completed: GAD-7 and PHQ 2&9  ASSESSMENT: Patient currently experiencing difficulty managing mental and physical health conditions resulting in ongoing substance use.   Patient may benefit from medication management, therapy, and linkage to community resources to strengthen support system. Pt is not interested in services at this time. Would like assistance with housing. LCSW informed patient of  [employer].  PLAN: 1. Follow up with behavioral health clinician on : Contact LCSW with any additional behavioral health and/or resource needs 2. Behavioral recommendations: Utilize healthy coping skills discussed and follow up with [employer] 3. Referral(s): [place] (In Clinic) and [employer] Resources:  Housing 4. "From scale of 1-10, how likely are you to follow plan?":   Rebekah [place], LCSW 01/07/2020 3:32 PM

## 2020-01-07 NOTE — Progress Notes (Signed)
Cardiology Office Note:    Date:  01/08/2020   ID:  LAWRENCE MITCH, DOB 05/22/1961, MRN [id number]  PCP:  [name], MD  Cardiologist:  Donato Heinz, MD  Electrophysiologist:  None   Referring MD: [name], MD   Chief Complaint  Patient presents with  . Congestive Heart Failure    History of Present Illness:    Chris [name] is a 59 y.o. 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  He has a history of nonischemic cardiomyopathy with EF as low as 20 to 25% and normal left heart catheterization in 2015 at [facility].  He was admitted to [facility] from 7/12 through 12/27/2019.  He presented with progressive shortness of breath and was found to be hypoxic to 80s on admission.  He had altered mental status on presentation, was found to have hypercapnic hypoxic respiratory failure and admitted to ICU for BiPAP.  He was markedly hypertensive and started on Cardene drip.  In addition, he was volume overloaded and diuresed with IV Lasix.  Presentation was thought to be multifactorial with COPD exacerbation and acute on chronic systolic/diastolic heart failure contributing.  He diuresed well and was transitioned to oral Lasix on discharge.  He was weaned off Cardene drip and started on Coreg, BiDil, and Entresto.    Echocardiogram 10/27/2019 showed EF 30 to 35%, diffuse hypokinesis with basal inferior akinesis, moderate LVH, grade 2 diastolic dysfunction, low normal RV function, mild MR.  Echocardiogram on 12/25/2019 shows LVEF 30 to 35%,  no significant change from prior study.  Since discharge from hospital, he reports that he has been doing well.  Does report he has had some shortness of breath but denies any chest pain.  States that most exertion he does is walking around his home.  He has been using Lasix and states weight has been  stable, running 275 to 90 pounds at home.  Denies any lightheadedness or syncope.  Reports rare palpitations.  Denies any lower extremity edema.  States that he last used cocaine over 1 month ago   Wt Readings from Last 3 Encounters:  01/08/20 (!) 273 lb 6.4 oz (124 kg)  12/28/19 276 lb 6.4 oz (125.4 kg)  12/27/19 274 lb 6.4 oz (124.5 kg)     Past Medical History:  Diagnosis Date  . CHF (congestive heart failure) ([place])   . Chronic kidney disease   . COPD (chronic obstructive pulmonary disease) ([place])   . Coronary artery disease   . Depression   . Diabetes mellitus without complication ([place])   . GERD (gastroesophageal reflux disease)   . Gout   . Hypertension   . Influenza A with respiratory manifestations   . Mental disorder     Past Surgical History:  Procedure Laterality Date  . ANKLE SURGERY    . CARDIAC CATHETERIZATION    . SHOULDER SURGERY      Current Medications: Current Meds  Medication Sig  . albuterol (PROVENTIL) (2.5 MG/3ML) 0.083% nebulizer solution Take 3 mLs by nebulization every 6 (six) hours as needed for shortness of breath.  [name] albuterol (VENTOLIN HFA) 108 (90 Base) MCG/ACT inhaler Inhale 2 puffs into the lungs every 6 (six) hours as needed for wheezing or shortness of breath.  . allopurinol (ZYLOPRIM) 100 MG tablet Take 1 tablet (100 mg total) by mouth daily.  [name] aspirin EC 81  MG EC tablet Take 1 tablet (81 mg total) by mouth daily.  [name] atorvastatin (LIPITOR) 40 MG tablet Take 1 tablet (40 mg total) by mouth daily at 6 PM.  . azithromycin (ZITHROMAX) 500 MG tablet Take 1 tablet (500 mg total) by mouth daily.  . blood glucose meter kit and supplies Dispense based on patient and insurance preference. Use up to four times daily as directed. (FOR ICD-10 E10.9, E11.9).  [name] Blood Glucose Monitoring Suppl (ACCU-CHEK GUIDE) w/Device KIT USE AS DIRECTED  . Blood Glucose Monitoring Suppl (TRUE METRIX METER) w/Device KIT Use as directed  . budesonide-formoterol  (SYMBICORT) 160-4.5 MCG/ACT inhaler Inhale 2 puffs into the lungs 2 (two) times daily.  . carvedilol (COREG) 25 MG tablet Take 1 tablet (25 mg total) by mouth 2 (two) times daily with a meal.  . fluticasone (FLONASE) 50 MCG/ACT nasal spray Place 1 spray into both nostrils daily as needed for allergies or rhinitis.  . furosemide (LASIX) 40 MG tablet Take 1.5 tablets (60 mg total) by mouth daily.  [name] glipiZIDE (GLUCOTROL) 5 MG tablet Take 0.5 tablets (2.5 mg total) by mouth 2 (two) times daily before a meal.  . glucose blood (ACCU-CHEK GUIDE) test strip Use as instructed. Check blood glucose by fingerstick once per day.  [name] glucose blood (TRUE METRIX BLOOD GLUCOSE TEST) test strip Use as instructed  . isosorbide-hydrALAZINE (BIDIL) 20-37.5 MG tablet Take 1 tablet by mouth 3 (three) times daily.  Elmore Guise Devices (LANCING DEVICE) MISC 1 Device by Does not apply route daily.  . montelukast (SINGULAIR) 10 MG tablet Take 1 tablet (10 mg total) by mouth at bedtime.  . nicotine (NICODERM CQ - DOSED IN MG/24 HOURS) 21 mg/24hr patch Place 1 patch (21 mg total) onto the skin daily.  . predniSONE (DELTASONE) 20 MG tablet Take 1 tablet (20 mg total) by mouth daily with breakfast.  . sacubitril-valsartan (ENTRESTO) 24-26 MG Take 1 tablet by mouth 2 (two) times daily.  . traZODone (DESYREL) 50 MG tablet Take 0.5-1 tablets (25-50 mg total) by mouth at bedtime as needed for sleep.  . TRUEplus Lancets 28G MISC Use as directed     Allergies:   Patient has no known allergies.   Social History   Socioeconomic History  . Marital status: Single    Spouse name: Not on file  . Number of children: Not on file  . Years of education: Not on file  . Highest education level: Not on file  Occupational History  . Not on file  Tobacco Use  . Smoking status: Current Every Day Smoker    Packs/day: 1.00    Years: 20.00    Pack years: 20.00    Types: Cigarettes  . Smokeless tobacco: Never Used  . Tobacco comment: 3/4  of a pack  Vaping Use  . Vaping Use: Never used  Substance and Sexual Activity  . Alcohol use: No  . Drug use: Yes    Frequency: 21.0 times per week    Types: Marijuana, Cocaine    Comment: no longer- Cocaine  . Sexual activity: Not on file  Other Topics Concern  . Not on file  Social History Narrative   ** Merged History Encounter **       Social Determinants of Health   Financial Resource Strain:   . Difficulty of Paying Living Expenses:   Food Insecurity:   . Worried About Charity fundraiser in the Last Year:   . [place] in the  Last Year:   Transportation Needs:   . Film/video editor (Medical):   [name] Lack of Transportation (Non-Medical):   Physical Activity:   . Days of Exercise per Week:   . Minutes of Exercise per Session:   Stress:   . Feeling of Stress :   Social Connections:   . Frequency of Communication with Friends and Family:   . Frequency of Social Gatherings with Friends and Family:   . Attends Religious Services:   . Active Member of Clubs or Organizations:   . Attends Archivist Meetings:   [name] Marital Status:      Family History: The patient's family history includes Heart disease in his father.  ROS:   Please see the history of present illness.     All other systems reviewed and are negative.  EKGs/Labs/Other Studies Reviewed:    The following studies were reviewed today:   EKG:  EKG is  ordered today.  The ekg ordered today demonstrates sinus rhythm, rate 75, LVH, < 1 mm ST depressions in inferior leads, nonspecific T wave flattening  Recent Labs: 10/26/2019: TSH 1.592 11/07/2019: ALT 24 12/24/2019: B Natriuretic Peptide 1,530.4 12/25/2019: Hemoglobin 16.7; Magnesium 1.9; Platelets 196 12/27/2019: BUN 40; Creatinine, Ser 2.20; Potassium 4.0; Sodium 136  Recent Lipid Panel    Component Value Date/Time   CHOL 176 12/25/2019 0632   CHOL 160 11/07/2019 0918   CHOL 157 10/21/2013 0435   TRIG 91 12/25/2019 0632   TRIG 117  10/21/2013 0435   HDL 37 (L) 12/25/2019 0632   HDL 40 11/07/2019 0918   HDL 30 (L) 10/21/2013 0435   CHOLHDL 4.8 12/25/2019 0632   VLDL 18 12/25/2019 0632   VLDL 23 10/21/2013 0435   LDLCALC 121 (H) 12/25/2019 0632   LDLCALC 92 11/07/2019 0918   LDLCALC 104 (H) 10/21/2013 0435    Physical Exam:    VS:  BP (!) 140/84   Pulse 75   Ht 6' (1.829 m)   Wt (!) 273 lb 6.4 oz (124 kg)   BMI 37.08 kg/m     Wt Readings from Last 3 Encounters:  01/08/20 (!) 273 lb 6.4 oz (124 kg)  12/28/19 276 lb 6.4 oz (125.4 kg)  12/27/19 274 lb 6.4 oz (124.5 kg)     GEN: in no acute distress HEENT: Normal NECK: No JVD; No carotid bruits LYMPHATICS: No lymphadenopathy CARDIAC: RRR, no murmurs, rubs, gallops RESPIRATORY:  Clear to auscultation without rales, wheezing or rhonchi  ABDOMEN: Soft, non-tender, non-distended MUSCULOSKELETAL:  No edema; No deformity  SKIN: Warm and dry NEUROLOGIC:  Alert and oriented x 3 PSYCHIATRIC:  Normal affect   ASSESSMENT:    1. Chronic combined systolic and diastolic heart failure ([place])   2. Essential hypertension   3. Acute renal failure superimposed on stage 3b chronic kidney disease, unspecified acute renal failure type ([place])   4. Hyperlipidemia, unspecified hyperlipidemia type    PLAN:    Chronic combined systolic and diastolic heart failure: EF 30 to 35%.  H/o cocaine use.  Normal cath in 2015, EF was as low as 20-25%. -Continue carvedilol 25 mg twice daily -Continue BiDil 20-37.5 mg 3 times daily -Continue Entresto 24-26 mg twice daily -Continue Lasix 60 mg daily.   -Will check BMET/magnesium and plan to add spironolactone if stable labs.  Will schedule follow-up in pharmacy clinic in 2 weeks to continue to titrate heart failure meds  Hypertension: Recent admission with hypertensive emergency, requiring Cardene drip.  Was weaned off  and started on Coreg, BiDil, Entresto.  Mildly elevated in clinic today, will check BMP as above and plan to add  spironolactone if stable renal function and potassium  CKD stage IIIb: Creatinine baseline 1.9-2.2  Hyperlipidemia: On atorvastatin 40 mg daily  T2DM: On glipizide  Cocaine use: Patient counseled on the risks of ongoing cocaine use and cessation strongly recommended  RTC in 2 months    Medication Adjustments/Labs and Tests Ordered: Current medicines are reviewed at length with the patient today.  Concerns regarding medicines are outlined above.  Orders Placed This Encounter  Procedures  . Basic metabolic panel  . Magnesium  . EKG 12-Lead   No orders of the defined types were placed in this encounter.   Patient Instructions  Medication Instructions:  No Changes *If you need a refill on your cardiac medications before your next appointment, please call your pharmacy*   Lab Work: BMET and MAG-  If you have labs (blood work) drawn today and your tests are completely normal, you will receive your results only by: [name] MyChart Message (if you have MyChart) OR . A paper copy in the mail If you have any lab test that is abnormal or we need to change your treatment, we will call you to review the results.   Testing/Procedures: None ordered   Follow-Up: At [facility], you and your health needs are our priority.  As part of our continuing mission to provide you with exceptional heart care, we have created designated Provider Care Teams.  These Care Teams include your primary Cardiologist (physician) and Advanced Practice Providers (APPs -  Physician Assistants and Nurse Practitioners) who all work together to provide you with the care you need, when you need it.  We recommend signing up for the patient portal called "MyChart".  Sign up information is provided on this After Visit Summary.  MyChart is used to connect with patients for Virtual Visits (Telemedicine).  Patients are able to view lab/test results, encounter notes, upcoming appointments, etc.  Non-urgent messages can be  sent to your provider as well.   To learn more about what you can do with MyChart, go to [email or web address].    Your next appointment:   2 month(s)  The format for your next appointment:   In Person  Provider:   Oswaldo Milian, MD   Other Instructions  2 Weeks for return for PharmD    Signed, Donato Heinz, MD  01/08/2020 10:25 PM    [place]

## 2020-01-08 ENCOUNTER — Other Ambulatory Visit: Payer: Self-pay

## 2020-01-08 ENCOUNTER — Encounter: Payer: Self-pay | Admitting: Cardiology

## 2020-01-08 ENCOUNTER — Ambulatory Visit (INDEPENDENT_AMBULATORY_CARE_PROVIDER_SITE_OTHER): Payer: Medicaid Other | Admitting: Cardiology

## 2020-01-08 ENCOUNTER — Ambulatory Visit: Payer: Self-pay | Admitting: *Deleted

## 2020-01-08 VITALS — BP 140/84 | HR 75 | Ht 72.0 in | Wt 273.4 lb

## 2020-01-08 DIAGNOSIS — I10 Essential (primary) hypertension: Secondary | ICD-10-CM | POA: Diagnosis not present

## 2020-01-08 DIAGNOSIS — E785 Hyperlipidemia, unspecified: Secondary | ICD-10-CM | POA: Diagnosis not present

## 2020-01-08 DIAGNOSIS — I5042 Chronic combined systolic (congestive) and diastolic (congestive) heart failure: Secondary | ICD-10-CM | POA: Diagnosis not present

## 2020-01-08 DIAGNOSIS — N1832 Chronic kidney disease, stage 3b: Secondary | ICD-10-CM

## 2020-01-08 DIAGNOSIS — N179 Acute kidney failure, unspecified: Secondary | ICD-10-CM | POA: Diagnosis not present

## 2020-01-08 NOTE — Patient Instructions (Signed)
Medication Instructions:  No Changes *If you need a refill on your cardiac medications before your next appointment, please call your pharmacy*   Lab Work: BMET and MAG-  If you have labs (blood work) drawn today and your tests are completely normal, you will receive your results only by: [name] MyChart Message (if you have MyChart) OR . A paper copy in the mail If you have any lab test that is abnormal or we need to change your treatment, we will call you to review the results.   Testing/Procedures: None ordered   Follow-Up: At [facility] you and your health needs are our priority.  As part of our continuing mission to provide you with exceptional heart care, we have created designated Provider Care Teams.  These Care Teams include your primary Cardiologist (physician) and Advanced Practice Providers (APPs -  Physician Assistants and Nurse Practitioners) who all work together to provide you with the care you need, when you need it.  We recommend signing up for the patient portal called "MyChart".  Sign up information is provided on this After Visit Summary.  MyChart is used to connect with patients for Virtual Visits (Telemedicine).  Patients are able to view lab/test results, encounter notes, upcoming appointments, etc.  Non-urgent messages can be sent to your provider as well.   To learn more about what you can do with MyChart, go to [email or web address].    Your next appointment:   2 month(s)  The format for your next appointment:   In Person  Provider:   Oswaldo Milian, MD   Other Instructions  2 Weeks for return for PharmD

## 2020-01-09 ENCOUNTER — Other Ambulatory Visit: Payer: Self-pay | Admitting: *Deleted

## 2020-01-09 ENCOUNTER — Telehealth: Payer: Self-pay | Admitting: Cardiology

## 2020-01-09 NOTE — Patient Outreach (Signed)
[place] [facility]) Care Management  01/09/2020  SOLOMON SKOWRONEK 08/26/1960 [id number]   CSW attempted to reach pt on 01/08/2020 for updates and was unable.  CSW left a voice message and will try again this week.   Eduard Clos, MSW, [place] Worker  [place] [phone]

## 2020-01-09 NOTE — Patient Outreach (Signed)
[place] [facility]) Care Management  01/09/2020  TREVIOUS RAMPEY Sep 29, 1960 [id number]    CSW made contact with pt who reports he went to the [facility] but "she wasn't there".  He also shares that his brother came to town and has helped him; "putting me up for a few nights but then when he's gone I don't know what I will do".  CSW reminded pt that a referral has been made to [facility] to assist him with housing and he has to follow through in order to connect with them for assistance.   CSW stressed to pt the importance of him calling/going to the [facility] to speak with Benita as arranged.   Pt states he will have his brother take him; encouraged pt to call and make an appointment with Benita.   CSW contacted Benita to update and was unavailable so a voice message was left asking for a call back.   Eduard Clos, MSW, [place] Worker  [place] [phone]

## 2020-01-09 NOTE — Telephone Encounter (Signed)
Left message for patient to call and schedule 2 week Pharm D visit and 2 month follow up with Dr. Gardiner Rhyme

## 2020-01-10 ENCOUNTER — Other Ambulatory Visit: Payer: Self-pay | Admitting: *Deleted

## 2020-01-10 NOTE — Telephone Encounter (Signed)
Left message for patient to call and schedule 2 week follow up with Pharm D and 2 month follow up with Dr. Gardiner Rhyme

## 2020-01-11 ENCOUNTER — Other Ambulatory Visit: Payer: Self-pay | Admitting: *Deleted

## 2020-01-11 ENCOUNTER — Telehealth: Payer: Self-pay | Admitting: Cardiology

## 2020-01-11 NOTE — Patient Outreach (Signed)
[place] [facility]) Care Management  01/11/2020  Chris [name] [birth year] [id number]   CSW attempted to make contact today (morning and afternoon) and left voice mail messages for him to call back.    Eduard Clos, MSW, [place] Worker  [place] [phone]

## 2020-01-11 NOTE — Patient Outreach (Signed)
[place] [facility]) Care Management  01/11/2020  Chris [name] [birth year] [id number]   CSW spoke with pt who is staying with his sister temporarily.  He still has not been able to connect with the housing issues.  CSW has placed a call to the [facility] and will assist pt further with linking.  Eduard Clos, MSW, [place] Worker  [place] [phone]

## 2020-01-11 NOTE — Telephone Encounter (Signed)
       I went in pt's chart to see who called him. It was Zigmund Daniel to schedule 2 appointments for pts. I made the appt for the Pharmacist and Dr Gardiner Rhyme

## 2020-01-14 ENCOUNTER — Ambulatory Visit: Payer: Self-pay

## 2020-01-14 ENCOUNTER — Other Ambulatory Visit: Payer: Self-pay | Admitting: *Deleted

## 2020-01-14 ENCOUNTER — Encounter: Payer: Self-pay | Admitting: Rheumatology

## 2020-01-14 ENCOUNTER — Other Ambulatory Visit: Payer: Self-pay

## 2020-01-14 ENCOUNTER — Telehealth: Payer: Self-pay

## 2020-01-14 ENCOUNTER — Ambulatory Visit: Payer: Medicaid Other | Admitting: Rheumatology

## 2020-01-14 ENCOUNTER — Ambulatory Visit (INDEPENDENT_AMBULATORY_CARE_PROVIDER_SITE_OTHER): Payer: Medicaid Other | Admitting: Rheumatology

## 2020-01-14 VITALS — BP 125/82 | HR 70 | Resp 18 | Ht 72.0 in | Wt 271.4 lb

## 2020-01-14 DIAGNOSIS — M25562 Pain in left knee: Secondary | ICD-10-CM | POA: Diagnosis not present

## 2020-01-14 DIAGNOSIS — Z8739 Personal history of other diseases of the musculoskeletal system and connective tissue: Secondary | ICD-10-CM

## 2020-01-14 DIAGNOSIS — N1832 Chronic kidney disease, stage 3b: Secondary | ICD-10-CM

## 2020-01-14 DIAGNOSIS — M25561 Pain in right knee: Secondary | ICD-10-CM

## 2020-01-14 DIAGNOSIS — I5043 Acute on chronic combined systolic (congestive) and diastolic (congestive) heart failure: Secondary | ICD-10-CM

## 2020-01-14 DIAGNOSIS — I10 Essential (primary) hypertension: Secondary | ICD-10-CM

## 2020-01-14 DIAGNOSIS — F1410 Cocaine abuse, uncomplicated: Secondary | ICD-10-CM

## 2020-01-14 DIAGNOSIS — M79671 Pain in right foot: Secondary | ICD-10-CM | POA: Diagnosis not present

## 2020-01-14 DIAGNOSIS — M79672 Pain in left foot: Secondary | ICD-10-CM

## 2020-01-14 DIAGNOSIS — J449 Chronic obstructive pulmonary disease, unspecified: Secondary | ICD-10-CM

## 2020-01-14 DIAGNOSIS — Z8639 Personal history of other endocrine, nutritional and metabolic disease: Secondary | ICD-10-CM

## 2020-01-14 DIAGNOSIS — Z9981 Dependence on supplemental oxygen: Secondary | ICD-10-CM

## 2020-01-14 DIAGNOSIS — Z5181 Encounter for therapeutic drug level monitoring: Secondary | ICD-10-CM | POA: Diagnosis not present

## 2020-01-14 DIAGNOSIS — G8929 Other chronic pain: Secondary | ICD-10-CM | POA: Diagnosis not present

## 2020-01-14 DIAGNOSIS — E782 Mixed hyperlipidemia: Secondary | ICD-10-CM

## 2020-01-14 DIAGNOSIS — F17200 Nicotine dependence, unspecified, uncomplicated: Secondary | ICD-10-CM

## 2020-01-14 DIAGNOSIS — J9611 Chronic respiratory failure with hypoxia: Secondary | ICD-10-CM

## 2020-01-14 DIAGNOSIS — F1210 Cannabis abuse, uncomplicated: Secondary | ICD-10-CM

## 2020-01-14 MED ORDER — COLCHICINE 0.6 MG PO TABS: ORAL_TABLET | ORAL | 0 refills | Status: DC

## 2020-01-14 MED ORDER — ALLOPURINOL 100 MG PO TABS: 200.0000 mg | ORAL_TABLET | Freq: Every day | ORAL | 2 refills | Status: DC

## 2020-01-14 MED FILL — COLCHICINE 0.6 MG TABS: 0.6 | 30 days supply | Qty: 8 | Fill #0

## 2020-01-14 MED FILL — ALLOPURINOL 100 MG TABLET: 100 | 30 days supply | Qty: 60 | Fill #0

## 2020-01-14 NOTE — Progress Notes (Signed)
Office Visit Note  Patient: Chris [name]             Date of Birth: May 22, 1961           MRN: [id number]             PCP: [name], MD Referring: [name], MD Visit Date: 01/14/2020 Occupation: @GUAROCC @  Subjective:  Pain in multiple joints.   History of Present Illness: Chris [name] is a 59 y.o. male seen in consultation per request of his PCP.  According to patient he has had history of gout for the last 15 years.  He has been on allopurinol for many years.  He states his gout is usually well controlled but when he drinks soda or eats meat he has a flare.  Patient states he is constantly having flares.  Is very occasionally when his joints are not hurting.  The flares are mostly in his knees and ankles.  He has occasional discomfort in his elbows.  He has never had head involvement.  He had a flare about 2 weeks ago with left knee joint and left great toe pain.  Activities of Daily Living:  Patient reports morning stiffness for 2  hours.   Patient Reports nocturnal pain.  Difficulty dressing/grooming: Reports Difficulty climbing stairs: Reports Difficulty getting out of chair: Reports Difficulty using hands for taps, buttons, cutlery, and/or writing: Denies  Review of Systems  Constitutional: Negative for fatigue and night sweats.  HENT: Negative for mouth sores, mouth dryness and nose dryness.   Eyes: Negative for redness, itching and dryness.  Respiratory: Positive for shortness of breath. Negative for difficulty breathing.   Cardiovascular: Negative for chest pain, palpitations, hypertension, irregular heartbeat and swelling in legs/feet.  Gastrointestinal: Negative for blood in stool, constipation and diarrhea.  Endocrine: Negative for increased urination.  Genitourinary: Negative for difficulty urinating.  Musculoskeletal: Positive for arthralgias, joint pain, joint swelling, myalgias, morning stiffness, muscle tenderness and myalgias. Negative for  muscle weakness.  Skin: Negative for color change, rash, hair loss, nodules/bumps, redness, skin tightness, ulcers and sensitivity to sunlight.  Allergic/Immunologic: Negative for susceptible to infections.  Neurological: Negative for dizziness, fainting, numbness, headaches, memory loss, night sweats and weakness.  Hematological: Negative for bruising/bleeding tendency and swollen glands.  Psychiatric/Behavioral: Negative for depressed mood, confusion and sleep disturbance. The patient is not nervous/anxious.     PMFS History:  Patient Active Problem List   Diagnosis Date Noted  . Chronic respiratory failure with hypoxia, on home oxygen therapy ([place]) 12/28/2019  . Type 2 diabetes mellitus with stage 3 chronic kidney disease ([place]) 12/25/2019  . Acute and chronic respiratory failure (acute-on-chronic) ([place]) 12/25/2019  . Acute on chronic combined systolic and diastolic CHF (congestive heart failure) ([place]) 10/26/2019  . Elevated troponin I level 10/26/2019  . Acute on chronic diastolic (congestive) heart failure ([place]) 10/26/2019  . History of gout 02/01/2019  . Seasonal allergic rhinitis due to pollen 02/01/2019  . Tobacco dependence 11/30/2018  . Microscopic hematuria 11/30/2018  . Depression 11/30/2018  . Difficulty controlling anger 11/30/2018  . COPD (chronic obstructive pulmonary disease) ([place])   . CKD (chronic kidney disease) stage 3, GFR 30-59 ml/min 08/10/2018  . Recurrent epistaxis 04/21/2018  . Mixed hyperlipidemia 07/28/2017  . Essential hypertension 07/28/2017  . Chronic systolic heart failure ([place]) 10/25/2014  . Cocaine abuse ([place]) 02/20/2013  . Cannabis abuse 02/20/2013  . Back pain, chronic 02/20/2013    Past Medical History:  Diagnosis Date  .  CHF (congestive heart failure) ([place])   . Chronic kidney disease   . COPD (chronic obstructive pulmonary disease) ([place])   . Coronary artery disease   . Depression   . Diabetes mellitus without complication ([place])   . GERD  (gastroesophageal reflux disease)   . Gout   . Hypertension   . Influenza A with respiratory manifestations   . Mental disorder     Family History  Problem Relation Age of Onset  . Heart disease Father   . Diabetes Mother   . HIV Brother   . Healthy Son   . Healthy Daughter    Past Surgical History:  Procedure Laterality Date  . ANKLE SURGERY    . CARDIAC CATHETERIZATION    . HERNIA REPAIR     x2  . SHOULDER SURGERY     Social History   Social History Narrative   ** Merged History Encounter **       Immunization History  Administered Date(s) Administered  . Tdap 11/30/2018     Objective: Vital Signs: BP 125/82 (BP Location: Left Arm, Patient Position: Sitting, Cuff Size: Normal)   Pulse 70   Resp 18   Ht 6' (1.829 m)   Wt 271 lb 6.4 oz (123.1 kg)   BMI 36.81 kg/m    Physical Exam Vitals and nursing note reviewed.  Constitutional:      Appearance: He is well-developed.  HENT:     Head: Normocephalic and atraumatic.  Eyes:     Conjunctiva/sclera: Conjunctivae normal.     Pupils: Pupils are equal, round, and reactive to light.  Cardiovascular:     Rate and Rhythm: Normal rate and regular rhythm.     Heart sounds: Normal heart sounds.  Pulmonary:     Effort: Pulmonary effort is normal.     Breath sounds: Normal breath sounds.  Abdominal:     General: Bowel sounds are normal.     Palpations: Abdomen is soft.  Musculoskeletal:     Cervical back: Normal range of motion and neck supple.  Skin:    General: Skin is warm and dry.     Capillary Refill: Capillary refill takes less than 2 seconds.  Neurological:     Mental Status: He is alert and oriented to person, place, and time.  Psychiatric:        Behavior: Behavior normal.      Musculoskeletal Exam: C-spine is noted range of motion.  Shoulder joints, elbow joints, wrist joints, MCPs PIPs DIPs are in good range of motion with no synovitis.  Hip joints, knee joints, ankles with good range of motion.  No  warmth swelling or effusion was noted.  He had mild tenderness over left first MTP joint.  CDAI Exam: CDAI Score: -- Patient Global: --; Provider Global: -- Swollen: --; Tender: -- Joint Exam 01/14/2020   No joint exam has been documented for this visit   There is currently no information documented on the homunculus. Go to the Rheumatology activity and complete the homunculus joint exam.  Investigation: No additional findings.  Imaging: DG Chest Port 1 View  Result Date: 12/24/2019 CLINICAL DATA:  Dyspnea, hypoxia EXAM: PORTABLE CHEST 1 VIEW COMPARISON:  10/27/2019 FINDINGS: The left costophrenic angle is excluded from view on this portable semi-upright chest radiograph. The visualized lungs are clear. No pneumothorax. No pleural effusion on the right. Moderate cardiomegaly is stable when compared to prior examination. There is central pulmonary vascular congestion again noted without overt pulmonary edema. IMPRESSION: Technically limited, but  stable examination with unchanged cardiomegaly and central pulmonary vascular congestion. Electronically Signed   By: Fidela Salisbury MD   On: 12/24/2019 22:50   XR Foot 2 Views Left  Result Date: 01/14/2020 A screw was present between tibiotalar joint.  First MTP, PIP and DIP narrowing was noted.  No erosive changes were noted.  No intertarsal joint space narrowing was noted. Impression: These findings are consistent with osteoarthritis of the foot.  Postsurgical changes were noted.  XR Foot 2 Views Right  Result Date: 01/14/2020 Severe first MTP, PIP and DIP narrowing was noted.  No intertarsal tibiotalar joint space narrowing was noted. Impression: These findings are consistent with severe osteoarthritis of the foot.  XR KNEE 3 VIEW LEFT  Result Date: 01/14/2020 Moderate medial compartment narrowing was noted.  Moderate patellofemoral narrowing was noted.  No chondrocalcinosis was noted. Impression: These findings are consistent with moderate  osteoarthritis and moderate chondromalacia patella.  XR KNEE 3 VIEW RIGHT  Result Date: 01/14/2020 Moderate medial compartment narrowing was noted.  Lateral osteophytes were noted.  Moderate patellofemoral narrowing was noted.  No chondrocalcinosis was noted. Impression: These findings are consistent with moderate osteoarthritis and moderate chondromalacia patella.  ECHOCARDIOGRAM LIMITED  Result Date: 12/25/2019    ECHOCARDIOGRAM LIMITED REPORT   Patient Name:   Chris [name] Date of Exam: 12/25/2019 Medical Rec #:  [id number]       Height:       66.0 in Accession #:    [id number]      Weight:       271.8 lb Date of Birth:  11-15-1960       BSA:          2.278 m Patient Age:    89 years        BP:           148/116 mmHg Patient Gender: M               HR:           76 bpm. Exam Location:  Inpatient Procedure: Limited Echo, Limited Color Doppler and Cardiac Doppler Indications:    Cardiomyopathy 425.9  History:        Patient has prior history of Echocardiogram examinations, most                 recent 10/27/2019. CHF, COPD and chronic kidney disease; Risk                 Factors:Hypertension, Dyslipidemia, Diabetes and cocaine use.  Sonographer:    Johny Chess Referring Phys: [id number] [place]  1. Diffuse hypokinesis with more pronounced hypokinesis in inferior/lateral walls. Left ventricular ejection fraction, by estimation, is 30 to 35%. The left ventricle has moderately decreased function. The left ventricle demonstrates global hypokinesis.  2. The mitral valve is normal in structure. Trivial mitral valve regurgitation.  3. The aortic valve is grossly normal. Aortic valve regurgitation is not visualized. No aortic stenosis is present.  4. The inferior vena cava is dilated in size with >50% respiratory variability, suggesting right atrial pressure of 8 mmHg. Comparison(s): No significant change from prior study. FINDINGS  Left Ventricle: Diffuse hypokinesis with more pronounced  hypokinesis in inferior/lateral walls. Left ventricular ejection fraction, by estimation, is 30 to 35%. The left ventricle has moderately decreased function. The left ventricle demonstrates global  hypokinesis. Mitral Valve: The mitral valve is normal in structure. Trivial mitral valve regurgitation. Tricuspid Valve: The tricuspid valve is normal in structure. Tricuspid  valve regurgitation is trivial. Aortic Valve: The aortic valve is grossly normal. Aortic valve regurgitation is not visualized. No aortic stenosis is present. Pulmonic Valve: The pulmonic valve was not well visualized. Pulmonic valve regurgitation is not visualized. Venous: The inferior vena cava is dilated in size with greater than 50% respiratory variability, suggesting right atrial pressure of 8 mmHg.  LEFT VENTRICLE PLAX 2D LVIDd:         6.40 cm  Diastology LVIDs:         5.10 cm  LV e' lateral:   5.66 cm/s LV PW:         1.30 cm  LV E/e' lateral: 13.0 LV IVS:        1.00 cm  LV e' medial:    4.35 cm/s LVOT diam:     2.30 cm  LV E/e' medial:  16.9 LV SV:         79 LV SV Index:   35 LVOT Area:     4.15 cm  IVC IVC diam: 2.30 cm LEFT ATRIUM         Index LA diam:    4.00 cm 1.76 cm/m  AORTIC VALVE LVOT Vmax:   123.00 cm/s LVOT Vmean:  71.200 cm/s LVOT VTI:    0.190 m  AORTA Ao Root diam: 4.00 cm MITRAL VALVE MV Area (PHT): 3.03 cm    SHUNTS MV Decel Time: 250 msec    Systemic VTI:  0.19 m MV E velocity: 73.70 cm/s  Systemic Diam: 2.30 cm MV A velocity: 72.00 cm/s MV E/A ratio:  1.02 Buford Dresser MD Electronically signed by Buford Dresser MD Signature Date/Time: 12/25/2019/7:33:36 PM    Final     Recent Labs: Lab Results  Component Value Date   WBC 7.9 12/25/2019   HGB 16.7 12/25/2019   PLT 196 12/25/2019   NA 136 12/27/2019   K 4.0 12/27/2019   CL 98 12/27/2019   CO2 27 12/27/2019   GLUCOSE 114 (H) 12/27/2019   BUN 40 (H) 12/27/2019   CREATININE 2.20 (H) 12/27/2019   BILITOT 0.3 11/07/2019   ALKPHOS 87  11/07/2019   AST 19 11/07/2019   ALT 24 11/07/2019   PROT 6.4 11/07/2019   ALBUMIN 3.8 11/07/2019   CALCIUM 8.5 (L) 12/27/2019   GFRAA 37 (L) 12/27/2019    Speciality Comments: No specialty comments available.  Procedures:  No procedures performed Allergies: Patient has no known allergies.   Assessment / Plan:     Visit Diagnoses: History of gout -patient states he continues to have frequent gout flares.  His last flare was about 2 weeks ago.  I detailed discussion regarding gout management.  He states he is not able to change his diet.  His gout flares are mostly related to his diet.  Dietary management was discussed at length.  His uric acid should be less than 6.0.  My plan is to start him on colchicine 0.6 mg, half tablet p.o.  every other day.  As he is on carvedilol, statins and has low GFR lower dose is advisable.  I will also increase his allopurinol 100 mg tablet, 2 tablets p.o. daily after 1 week of starting colchicine.  We will check labs in a month which will include CBC, CMP and uric acid.  Plan: Uric acid  Chronic pain of both knees -he complains of pain and discomfort in his bilateral knee joints.  Plan: XR KNEE 3 VIEW RIGHT, XR KNEE 3 VIEW LEFT.  X-rays are consistent with  moderate osteoarthritis and moderate chondromalacia patella.  Pain in both feet -he complains of pain and discomfort in his bilateral feet.  Plan: XR Foot 2 Views Right, XR Foot 2 Views Left,.  X-ray shows osteoarthritic changes and postsurgical changes.  Rheumatoid factor, Cyclic citrul peptide antibody, IgG  Medication monitoring encounter - Plan: COMPLETE METABOLIC PANEL WITH GFR  He has multiple medical problems which are listed as follows:  Acute on chronic combined systolic and diastolic CHF (congestive heart failure) (HCC)  Stage 3b chronic kidney disease  History of diabetes mellitus  Chronic respiratory failure with hypoxia, on home oxygen therapy (HCC)  Essential hypertension  Mixed  hyperlipidemia  Tobacco dependence  Chronic obstructive pulmonary disease, unspecified COPD type ([place])  Cocaine abuse ([place])  Cannabis abuse  Orders: Orders Placed This Encounter  Procedures  . XR KNEE 3 VIEW RIGHT  . XR KNEE 3 VIEW LEFT  . XR Foot 2 Views Right  . XR Foot 2 Views Left  . COMPLETE METABOLIC PANEL WITH GFR  . Uric acid  . Rheumatoid factor  . Cyclic citrul peptide antibody, IgG   Meds ordered this encounter  Medications  . colchicine 0.6 MG tablet    Sig: Take half tablet by mouth every other day.    Dispense:  23 tablet    Refill:  0  . allopurinol (ZYLOPRIM) 100 MG tablet    Sig: Take 2 tablets (200 mg total) by mouth daily.    Dispense:  60 tablet    Refill:  2      Follow-Up Instructions: Return for Gout, Osteoarthritis.   Bo Merino, MD  Note - This record has been created using Editor, commissioning.  Chart creation errors have been sought, but may not always  have been located. Such creation errors do not reflect on  the standard of medical care.

## 2020-01-14 NOTE — Patient Outreach (Signed)
[facility]) Care Management  01/14/2020  Chris [name] 10-29-1960 [id number]    CSW made contact with pt to assist him with referral to the [employer] program; the entry point for housing assistance per [place] at [facility]  CSW encouraged pt to listen out for a call from them and to call CSW if needs/questions arise prior to next outreach call.   Eduard Clos, MSW, [place] Worker  [place] [phone]

## 2020-01-14 NOTE — Telephone Encounter (Signed)
Dr. Estanislado Pandy sent prescriptions for colchcine and allopurinol to the pharmacy. I called patient to clarify dosage of colchicine, per Dr. Estanislado Pandy. Patient verbalized understanding.

## 2020-01-14 NOTE — Patient Instructions (Addendum)
Start colchicine half tablet by mouth every other day. Next week increase allopurinol 100 mg tablet, 2 tablets daily.   Gout  Gout is painful swelling of your joints. Gout is a type of arthritis. It is caused by having too much uric acid in your body. Uric acid is a chemical that is made when your body breaks down substances called purines. If your body has too much uric acid, sharp crystals can form and build up in your joints. This causes pain and swelling. Gout attacks can happen quickly and be very painful (acute gout). Over time, the attacks can affect more joints and happen more often (chronic gout). What are the causes?  Too much uric acid in your blood. This can happen because: ? Your kidneys do not remove enough uric acid from your blood. ? Your body makes too much uric acid. ? You eat too many foods that are high in purines. These foods include organ meats, some seafood, and beer.  Trauma or stress. What increases the risk?  Having a family history of gout.  Being male and middle-aged.  Being male and having gone through menopause.  Being very overweight (obese).  Drinking alcohol, especially beer.  Not having enough water in the body (being dehydrated).  Losing weight too quickly.  Having an organ transplant.  Having lead poisoning.  Taking certain medicines.  Having kidney disease.  Having a skin condition called psoriasis. What are the signs or symptoms? An attack of acute gout usually happens in just one joint. The most common place is the big toe. Attacks often start at night. Other joints that may be affected include joints of the feet, ankle, knee, fingers, wrist, or elbow. Symptoms of an attack may include:  Very bad pain.  Warmth.  Swelling.  Stiffness.  Shiny, red, or purple skin.  Tenderness. The affected joint may be very painful to touch.  Chills and fever. Chronic gout may cause symptoms more often. More joints may be involved. You  may also have white or yellow lumps (tophi) on your hands or feet or in other areas near your joints. How is this treated?  Treatment for this condition has two phases: treating an acute attack and preventing future attacks.  Acute gout treatment may include: ? NSAIDs. ? Steroids. These are taken by mouth or injected into a joint. ? Colchicine. This medicine relieves pain and swelling. It can be given by mouth or through an IV tube.  Preventive treatment may include: ? Taking small doses of NSAIDs or colchicine daily. ? Using a medicine that reduces uric acid levels in your blood. ? Making changes to your diet. You may need to see a food expert (dietitian) about what to eat and drink to prevent gout. Follow these instructions at home: During a gout attack   If told, put ice on the painful area: ? Put ice in a plastic bag. ? Place a towel between your skin and the bag. ? Leave the ice on for 20 minutes, 2-3 times a day.  Raise (elevate) the painful joint above the level of your heart as often as you can.  Rest the joint as much as possible. If the joint is in your leg, you may be given crutches.  Follow instructions from your doctor about what you cannot eat or drink. Avoiding future gout attacks  Eat a low-purine diet. Avoid foods and drinks such as: ? Liver. ? Kidney. ? Anchovies. ? Asparagus. ? Herring. ? Mushrooms. ? Mussels. ?  Beer.  Stay at a healthy weight. If you want to lose weight, talk with your doctor. Do not lose weight too fast.  Start or continue an exercise plan as told by your doctor. Eating and drinking  Drink enough fluids to keep your pee (urine) pale yellow.  If you drink alcohol: ? Limit how much you use to:  0-1 drink a day for women.  0-2 drinks a day for men. ? Be aware of how much alcohol is in your drink. In the [place] one drink equals one 12 oz bottle of beer (355 mL), one 5 oz glass of wine (148 mL), or one 1 oz glass of hard liquor (44  mL). General instructions  Take over-the-counter and prescription medicines only as told by your doctor.  Do not drive or use heavy machinery while taking prescription pain medicine.  Return to your normal activities as told by your doctor. Ask your doctor what activities are safe for you.  Keep all follow-up visits as told by your doctor. This is important. Contact a doctor if:  You have another gout attack.  You still have symptoms of a gout attack after 10 days of treatment.  You have problems (side effects) because of your medicines.  You have chills or a fever.  You have burning pain when you pee (urinate).  You have pain in your lower back or belly. Get help right away if:  You have very bad pain.  Your pain cannot be controlled.  You cannot pee. Summary  Gout is painful swelling of the joints.  The most common site of pain is the big toe, but it can affect other joints.  Medicines and avoiding some foods can help to prevent and treat gout attacks. This information is not intended to replace advice given to you by your health care provider. Make sure you discuss any questions you have with your health care provider. Document Revised: 12/21/2017 Document Reviewed: 12/21/2017 Elsevier Patient Education  [place].

## 2020-01-15 LAB — COMPLETE METABOLIC PANEL WITH GFR
AG Ratio: 1.2 (calc) (ref 1.0–2.5)
ALT: 11 U/L (ref 9–46)
AST: 16 U/L (ref 10–35)
Albumin: 3.5 g/dL — ABNORMAL LOW (ref 3.6–5.1)
Alkaline phosphatase (APISO): 68 U/L (ref 35–144)
BUN/Creatinine Ratio: 12 (calc) (ref 6–22)
BUN: 24 mg/dL (ref 7–25)
CO2: 27 mmol/L (ref 20–32)
Calcium: 9.2 mg/dL (ref 8.6–10.3)
Chloride: 103 mmol/L (ref 98–110)
Creat: 1.97 mg/dL — ABNORMAL HIGH (ref 0.70–1.33)
GFR, Est African American: 42 mL/min/{1.73_m2} — ABNORMAL LOW (ref >=60)
GFR, Est Non African American: 37 mL/min/{1.73_m2} — ABNORMAL LOW (ref >=60)
Globulin: 3 g/dL (calc) (ref 1.9–3.7)
Glucose, Bld: 54 mg/dL — ABNORMAL LOW (ref 65–139)
Potassium: 4.7 mmol/L (ref 3.5–5.3)
Sodium: 139 mmol/L (ref 135–146)
Total Bilirubin: 0.4 mg/dL (ref 0.2–1.2)
Total Protein: 6.5 g/dL (ref 6.1–8.1)

## 2020-01-15 LAB — URIC ACID: Uric Acid, Serum: 9.1 mg/dL — ABNORMAL HIGH (ref 4.0–8.0)

## 2020-01-15 LAB — RHEUMATOID FACTOR: Rheumatoid fact SerPl-aCnc: <14 IU/mL (ref <14)

## 2020-01-15 LAB — CYCLIC CITRUL PEPTIDE ANTIBODY, IGG: Cyclic Citrullin Peptide Ab: <16 UNITS

## 2020-01-15 NOTE — Progress Notes (Signed)
GFR is low and stable. Uric acid is high. Rheumatoid factor and anti-CCP are negative.

## 2020-01-17 ENCOUNTER — Other Ambulatory Visit: Payer: Self-pay | Admitting: *Deleted

## 2020-01-17 ENCOUNTER — Telehealth: Payer: Self-pay | Admitting: Internal Medicine

## 2020-01-17 NOTE — Telephone Encounter (Signed)
Pt is needing a letter to go to the [facility]

## 2020-01-17 NOTE — Telephone Encounter (Signed)
Please advise.  Copied from [place] [id number]. Topic: General - Inquiry >> Jan 17, 2020 12:02 PM [name] wrote: Reason for CRM: Pt is calling to let Chris [name] know heart doc gave permission for pt to start working out regularly and also he is needing access to a gym pass. Pt is requesting a callback as well for more information. Please advise

## 2020-01-18 NOTE — Patient Outreach (Signed)
[facility]) Care Management  01/18/2020  Chris [name] July 22, 1960 [id number]   CSW spoke with pt who reports he did hear back from the [employer] rep (for housing assistance) and is awaiting further word. Meanwhile, pt reports he is staying with family (cousin last night) and is able to use his nebulizer and Oxygen.   CSW stressed to pt the importance of him following up to any outreach calls he may receive related to housing.   Pt denies any other current concerns or issues and is reminded to call if needs arise prior to follow up call.   Chris [name], MSW, [place] Worker  [place] [phone]

## 2020-01-18 NOTE — Telephone Encounter (Signed)
Contacted pt and left a detailed vm informing pt that letter is ready for pick up at the front desk and if he has any questions or concerns to give a call

## 2020-01-18 NOTE — Telephone Encounter (Signed)
Done

## 2020-01-25 ENCOUNTER — Other Ambulatory Visit: Payer: Self-pay | Admitting: *Deleted

## 2020-01-25 NOTE — Patient Outreach (Signed)
[facility]) Care Management  01/25/2020  Chris [name] 10/19/1960 [id number]   CSW made contact with pt today who reports, "I am gonna go up to that place on Monday".  Pt shares he has been skeptical about going to the [facility].  "I don't like being around a lot of people and I heard crazy stuff about that place".  CSW reminded pt he has to take the steps to get the help they can provide.  Per pt, he has been staying with family; cousin and sister and reports "they are trying to do better".  CSW will plan to sign off at this time as pt has been provided with the resources to assist with his housing and he can choose to follow up as he wishes.  Pt has CSW contact info and is reminded to call if needs arise. Pt appreciative of assistance.   Eduard Clos, MSW, [place] Worker  [place] [phone]

## 2020-01-27 NOTE — Progress Notes (Deleted)
Office Visit Note  Patient: Chris [name]             Date of Birth: Apr 27, 1961           MRN: [id number]             PCP: [name], MD Referring: [name], MD Visit Date: 02/06/2020 Occupation: @GUAROCC @  Subjective:  No chief complaint on file.   History of Present Illness: Chris [name] is a 59 y.o. male ***   Activities of Daily Living:  Patient reports morning stiffness for *** {minute/hour:19697}.   Patient {ACTIONS;DENIES/REPORTS:21021675::"Denies"} nocturnal pain.  Difficulty dressing/grooming: {ACTIONS;DENIES/REPORTS:21021675::"Denies"} Difficulty climbing stairs: {ACTIONS;DENIES/REPORTS:21021675::"Denies"} Difficulty getting out of chair: {ACTIONS;DENIES/REPORTS:21021675::"Denies"} Difficulty using hands for taps, buttons, cutlery, and/or writing: {ACTIONS;DENIES/REPORTS:21021675::"Denies"}  No Rheumatology ROS completed.   PMFS History:  Patient Active Problem List   Diagnosis Date Noted  . Chronic respiratory failure with hypoxia, on home oxygen therapy ([place]) 12/28/2019  . Type 2 diabetes mellitus with stage 3 chronic kidney disease ([place]) 12/25/2019  . Acute and chronic respiratory failure (acute-on-chronic) ([place]) 12/25/2019  . Acute on chronic combined systolic and diastolic CHF (congestive heart failure) ([place]) 10/26/2019  . Elevated troponin I level 10/26/2019  . Acute on chronic diastolic (congestive) heart failure ([place]) 10/26/2019  . History of gout 02/01/2019  . Seasonal allergic rhinitis due to pollen 02/01/2019  . Tobacco dependence 11/30/2018  . Microscopic hematuria 11/30/2018  . Depression 11/30/2018  . Difficulty controlling anger 11/30/2018  . COPD (chronic obstructive pulmonary disease) ([place])   . CKD (chronic kidney disease) stage 3, GFR 30-59 ml/min 08/10/2018  . Recurrent epistaxis 04/21/2018  . Mixed hyperlipidemia 07/28/2017  . Essential hypertension 07/28/2017  . Chronic systolic heart failure ([place]) 10/25/2014  .  Cocaine abuse ([place]) 02/20/2013  . Cannabis abuse 02/20/2013  . Back pain, chronic 02/20/2013    Past Medical History:  Diagnosis Date  . CHF (congestive heart failure) ([place])   . Chronic kidney disease   . COPD (chronic obstructive pulmonary disease) ([place])   . Coronary artery disease   . Depression   . Diabetes mellitus without complication ([place])   . GERD (gastroesophageal reflux disease)   . Gout   . Hypertension   . Influenza A with respiratory manifestations   . Mental disorder     Family History  Problem Relation Age of Onset  . Heart disease Father   . Diabetes Mother   . HIV Brother   . Healthy Son   . Healthy Daughter    Past Surgical History:  Procedure Laterality Date  . ANKLE SURGERY    . CARDIAC CATHETERIZATION    . HERNIA REPAIR     x2  . SHOULDER SURGERY     Social History   Social History Narrative   ** Merged History Encounter **       Immunization History  Administered Date(s) Administered  . Tdap 11/30/2018     Objective: Vital Signs: There were no vitals taken for this visit.   Physical Exam   Musculoskeletal Exam: ***  CDAI Exam: CDAI Score: -- Patient Global: --; Provider Global: -- Swollen: --; Tender: -- Joint Exam 02/06/2020   No joint exam has been documented for this visit   There is currently no information documented on the homunculus. Go to the Rheumatology activity and complete the homunculus joint exam.  Investigation: No additional findings.  Imaging: XR Foot 2 Views Left  Result Date: 01/14/2020 A screw was present between tibiotalar joint.  First MTP, PIP and DIP narrowing was noted.  No erosive changes were noted.  No intertarsal joint space narrowing was noted. Impression: These findings are consistent with osteoarthritis of the foot.  Postsurgical changes were noted.  XR Foot 2 Views Right  Result Date: 01/14/2020 Severe first MTP, PIP and DIP narrowing was noted.  No intertarsal tibiotalar joint space narrowing  was noted. Impression: These findings are consistent with severe osteoarthritis of the foot.  XR KNEE 3 VIEW LEFT  Result Date: 01/14/2020 Moderate medial compartment narrowing was noted.  Moderate patellofemoral narrowing was noted.  No chondrocalcinosis was noted. Impression: These findings are consistent with moderate osteoarthritis and moderate chondromalacia patella.  XR KNEE 3 VIEW RIGHT  Result Date: 01/14/2020 Moderate medial compartment narrowing was noted.  Lateral osteophytes were noted.  Moderate patellofemoral narrowing was noted.  No chondrocalcinosis was noted. Impression: These findings are consistent with moderate osteoarthritis and moderate chondromalacia patella.   Recent Labs: Lab Results  Component Value Date   WBC 7.9 12/25/2019   HGB 16.7 12/25/2019   PLT 196 12/25/2019   NA 139 01/14/2020   K 4.7 01/14/2020   CL 103 01/14/2020   CO2 27 01/14/2020   GLUCOSE 54 (L) 01/14/2020   BUN 24 01/14/2020   CREATININE 1.97 (H) 01/14/2020   BILITOT 0.4 01/14/2020   ALKPHOS 87 11/07/2019   AST 16 01/14/2020   ALT 11 01/14/2020   PROT 6.5 01/14/2020   ALBUMIN 3.8 11/07/2019   CALCIUM 9.2 01/14/2020   GFRAA 42 (L) 01/14/2020    Speciality Comments: No specialty comments available.  Procedures:  No procedures performed Allergies: Patient has no known allergies.   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  No orders of the defined types were placed in this encounter.   Face-to-face time spent with patient was *** minutes. Greater than 50% of time was spent in counseling and coordination of care.  Follow-Up Instructions: No follow-ups on file.   Bo Merino, MD  Note - This record has been created using Editor, commissioning.  Chart creation errors have been sought, but may not always  have been located. Such creation errors do not reflect on  the standard of medical care.

## 2020-01-30 MED FILL — ALBUTEROL SUL 2.5 MG/3 ML S: (2.5 MG/3ML | 7 days supply | Qty: 75 | Fill #1

## 2020-02-01 ENCOUNTER — Ambulatory Visit: Payer: Medicaid Other

## 2020-02-01 NOTE — Progress Notes (Deleted)
02/01/2020 KELLIS MCADAM Mar 11, 1961 [id number]   HPI:  Chris [name] is a 59 y.o. male patient of Dr Gardiner Rhyme, with a PMH below who presents today for hypertension clinic evaluation.  Past Medical History: CHF Combined systolic/diastolic (EF 24-46% up from 20-25%)  DM2 7/21: Ac1 6.8 - on glipizide 2.5 mg bid  Substance abuse   hyperlipidemia 7/21:  TC 176, TG 91, HDL 37, LDL 121 - on atorvastatin 40 mg  CKD IIIb, baseline creatinine 1.9-2.2     Blood Pressure Goal:  130/80  Current Medications: carvedilol 25 mg bid, Entresto 24/26 mg bid, furosemide 60 mg qd, BiDil 20/37.5 mg tid  Family Hx:  Social Hx:  Diet:  Exercise:  Home BP readings:  Intolerances:   Labs: 8/21:  Na 139, K 4.7, Glu 54, BUN 24, SCr 1.97, GFR 42  Wt Readings from Last 3 Encounters:  01/14/20 271 lb 6.4 oz (123.1 kg)  01/08/20 (!) 273 lb 6.4 oz (124 kg)  12/28/19 276 lb 6.4 oz (125.4 kg)   BP Readings from Last 3 Encounters:  01/14/20 125/82  01/08/20 (!) 140/84  12/28/19 118/69   Pulse Readings from Last 3 Encounters:  01/14/20 70  01/08/20 75  12/28/19 73    Current Outpatient Medications  Medication Sig Dispense Refill  . albuterol (PROVENTIL) (2.5 MG/3ML) 0.083% nebulizer solution Take 3 mLs by nebulization every 6 (six) hours as needed for shortness of breath. 75 mL 1  . albuterol (VENTOLIN HFA) 108 (90 Base) MCG/ACT inhaler Inhale 2 puffs into the lungs every 6 (six) hours as needed for wheezing or shortness of breath. 18 g 1  . allopurinol (ZYLOPRIM) 100 MG tablet Take 2 tablets (200 mg total) by mouth daily. 60 tablet 2  . aspirin EC 81 MG EC tablet Take 1 tablet (81 mg total) by mouth daily. 30 tablet 0  . atorvastatin (LIPITOR) 40 MG tablet Take 1 tablet (40 mg total) by mouth daily at 6 PM. 30 tablet 2  . blood glucose meter kit and supplies Dispense based on patient and insurance preference. Use up to four times daily as directed. (FOR ICD-10 E10.9, E11.9). 1 each 0  .  Blood Glucose Monitoring Suppl (ACCU-CHEK GUIDE) w/Device KIT USE AS DIRECTED    . Blood Glucose Monitoring Suppl (TRUE METRIX METER) w/Device KIT Use as directed 1 kit 0  . budesonide-formoterol (SYMBICORT) 160-4.5 MCG/ACT inhaler Inhale 2 puffs into the lungs 2 (two) times daily. 1 Inhaler 3  . carvedilol (COREG) 25 MG tablet Take 1 tablet (25 mg total) by mouth 2 (two) times daily with a meal. 120 tablet 1  . colchicine 0.6 MG tablet Take half tablet by mouth every other day. 23 tablet 0  . fluticasone (FLONASE) 50 MCG/ACT nasal spray Place 1 spray into both nostrils daily as needed for allergies or rhinitis. 16 g 1  . furosemide (LASIX) 40 MG tablet Take 1.5 tablets (60 mg total) by mouth daily. 45 tablet 6  . glipiZIDE (GLUCOTROL) 5 MG tablet Take 0.5 tablets (2.5 mg total) by mouth 2 (two) times daily before a meal. 30 tablet 2  . glucose blood (ACCU-CHEK GUIDE) test strip Use as instructed. Check blood glucose by fingerstick once per day. 100 each 12  . glucose blood (TRUE METRIX BLOOD GLUCOSE TEST) test strip Use as instructed 100 each 12  . isosorbide-hydrALAZINE (BIDIL) 20-37.5 MG tablet Take 1 tablet by mouth 3 (three) times daily. 90 tablet 1  . Lancet Devices (LANCING  DEVICE) MISC 1 Device by Does not apply route daily. 1 each 0  . montelukast (SINGULAIR) 10 MG tablet Take 1 tablet (10 mg total) by mouth at bedtime. 90 tablet 1  . predniSONE (DELTASONE) 20 MG tablet Take 1 tablet (20 mg total) by mouth daily with breakfast. 2 tablet 0  . sacubitril-valsartan (ENTRESTO) 24-26 MG Take 1 tablet by mouth 2 (two) times daily. 60 tablet 1  . TRUEplus Lancets 28G MISC Use as directed 100 each 4   No current facility-administered medications for this visit.    No Known Allergies  Past Medical History:  Diagnosis Date  . CHF (congestive heart failure) ([place])   . Chronic kidney disease   . COPD (chronic obstructive pulmonary disease) ([place])   . Coronary artery disease   . Depression   .  Diabetes mellitus without complication ([place])   . GERD (gastroesophageal reflux disease)   . Gout   . Hypertension   . Influenza A with respiratory manifestations   . Mental disorder     There were no vitals taken for this visit.  No problem-specific Assessment & Plan notes found for this encounter.   Tommy Medal PharmD CPP [facility] [address] [place], [place] [address] [phone]

## 2020-02-06 ENCOUNTER — Ambulatory Visit: Payer: Medicaid Other | Admitting: Rheumatology

## 2020-02-06 DIAGNOSIS — J9611 Chronic respiratory failure with hypoxia: Secondary | ICD-10-CM

## 2020-02-06 DIAGNOSIS — F17200 Nicotine dependence, unspecified, uncomplicated: Secondary | ICD-10-CM

## 2020-02-06 DIAGNOSIS — F1410 Cocaine abuse, uncomplicated: Secondary | ICD-10-CM

## 2020-02-06 DIAGNOSIS — J449 Chronic obstructive pulmonary disease, unspecified: Secondary | ICD-10-CM

## 2020-02-06 DIAGNOSIS — M19071 Primary osteoarthritis, right ankle and foot: Secondary | ICD-10-CM

## 2020-02-06 DIAGNOSIS — F1210 Cannabis abuse, uncomplicated: Secondary | ICD-10-CM

## 2020-02-06 DIAGNOSIS — Z5181 Encounter for therapeutic drug level monitoring: Secondary | ICD-10-CM

## 2020-02-06 DIAGNOSIS — E782 Mixed hyperlipidemia: Secondary | ICD-10-CM

## 2020-02-06 DIAGNOSIS — Z8739 Personal history of other diseases of the musculoskeletal system and connective tissue: Secondary | ICD-10-CM

## 2020-02-06 DIAGNOSIS — M170 Bilateral primary osteoarthritis of knee: Secondary | ICD-10-CM

## 2020-02-06 DIAGNOSIS — Z8639 Personal history of other endocrine, nutritional and metabolic disease: Secondary | ICD-10-CM

## 2020-02-06 DIAGNOSIS — I5043 Acute on chronic combined systolic (congestive) and diastolic (congestive) heart failure: Secondary | ICD-10-CM

## 2020-02-06 DIAGNOSIS — I10 Essential (primary) hypertension: Secondary | ICD-10-CM

## 2020-02-08 MED FILL — ALLOPURINOL 100 MG TABLET: 100 | 30 days supply | Qty: 60 | Fill #0

## 2020-02-08 MED FILL — COLCHICINE 0.6 MG TABS: 0.6 | 30 days supply | Qty: 8 | Fill #0

## 2020-02-20 MED FILL — glipiZIDE 5 MG TABS: 5 | 30 days supply | Qty: 30 | Fill #0

## 2020-02-22 ENCOUNTER — Encounter (HOSPITAL_COMMUNITY): Payer: Self-pay

## 2020-02-22 ENCOUNTER — Other Ambulatory Visit: Payer: Self-pay

## 2020-02-22 ENCOUNTER — Ambulatory Visit (HOSPITAL_COMMUNITY)
Admission: EM | Admit: 2020-02-22 | Discharge: 2020-02-22 | Disposition: A | Discharge status: Home / Self Care | Payer: Medicaid Other | Attending: Urgent Care | Admitting: Urgent Care

## 2020-02-22 DIAGNOSIS — Z20822 Contact with and (suspected) exposure to covid-19: Secondary | ICD-10-CM | POA: Diagnosis not present

## 2020-02-22 LAB — SARS CORONAVIRUS 2 (TAT 6-24 HRS): SARS Coronavirus 2: POSITIVE — AB

## 2020-02-22 NOTE — Discharge Instructions (Signed)
Please continue to keep social distance of 6 feet from others, wash hands frequently and wear facemask indoors or outdoors if you are unable to social distance.  If your Covid test is positive, member of the urgent care team will reach out to you with further instructions. If you have any further questions, please don't hesitate to reach out to the urgent care clinic.  Please sign up for MyChart to access your lab results.  

## 2020-02-22 NOTE — ED Triage Notes (Signed)
Patient presents to Urgent Care with complaints of needing a covid test since he needs one to play sports. Patient reports he has not any exposures or symptoms.

## 2020-02-23 ENCOUNTER — Telehealth: Payer: Self-pay | Admitting: Family

## 2020-02-23 NOTE — Telephone Encounter (Signed)
I connected by phone with [name] on 02/23/2020 at 10:12 AM to discuss the potential use of a new treatment for mild to moderate COVID-19 viral infection in non-hospitalized patients.  He was unaware that his COVID19 test returned positive on 02/22/20. He tells me he got tested due to an exposure. He reports no symptoms.   He does have risk factors for progression to severe COVID-19 and/or hospitalization as defined in EUA. Specific high risk criteria : BMI > 25, Chronic Kidney Disease (CKD), Diabetes, Cardiovascular disease or hypertension and Chronic Lung Disease  Educated to stay at home for isolation for next 10 days and monitor for symptoms including cough, sore throat, shortness of breath, fever, loss of smell, muscle/body aches, fu-like illness, diarrhea. Educated that if he notices increasing symptoms within the next 10 days to call hotline for scheduling of MAB infusion at [phone].  Loel Dubonnet 02/23/2020 10:12 AM

## 2020-02-25 ENCOUNTER — Telehealth: Payer: Self-pay

## 2020-02-25 ENCOUNTER — Other Ambulatory Visit: Payer: Self-pay | Admitting: Internal Medicine

## 2020-02-25 ENCOUNTER — Ambulatory Visit (HOSPITAL_COMMUNITY): Payer: Medicaid Other

## 2020-02-25 ENCOUNTER — Other Ambulatory Visit (HOSPITAL_COMMUNITY): Payer: Self-pay | Admitting: Adult Health

## 2020-02-25 ENCOUNTER — Ambulatory Visit (HOSPITAL_COMMUNITY)
Admission: RE | Admit: 2020-02-25 | Discharge: 2020-02-25 | Disposition: A | Discharge status: Home / Self Care | Payer: Medicaid Other | Source: Ambulatory Visit | Attending: Pulmonary Disease | Admitting: Pulmonary Disease

## 2020-02-25 DIAGNOSIS — U071 COVID-19: Secondary | ICD-10-CM | POA: Insufficient documentation

## 2020-02-25 DIAGNOSIS — J209 Acute bronchitis, unspecified: Secondary | ICD-10-CM

## 2020-02-25 MED ORDER — ALBUTEROL SULFATE (2.5 MG/3ML) 0.083% IN NEBU: 3.0000 mL | INHALATION_SOLUTION | Freq: Four times a day (QID) | RESPIRATORY_TRACT | 1 refills | Status: DC | PRN

## 2020-02-25 MED ORDER — SODIUM CHLORIDE 0.9 % IV SOLN
1200.0000 mg | Freq: Once | INTRAVENOUS | Status: AC
  Administered 2020-02-25: 1200 mg via INTRAVENOUS

## 2020-02-25 MED ORDER — FAMOTIDINE IN NACL 20-0.9 MG/50ML-% IV SOLN: 20.0000 mg | Freq: Once | INTRAVENOUS | Status: DC | PRN

## 2020-02-25 MED ORDER — DIPHENHYDRAMINE HCL 50 MG/ML IJ SOLN: 50.0000 mg | Freq: Once | INTRAMUSCULAR | Status: DC | PRN

## 2020-02-25 MED ORDER — METHYLPREDNISOLONE SODIUM SUCC 125 MG IJ SOLR: 125.0000 mg | Freq: Once | INTRAMUSCULAR | Status: DC | PRN

## 2020-02-25 MED ORDER — ALBUTEROL SULFATE HFA 108 (90 BASE) MCG/ACT IN AERS: 2.0000 | INHALATION_SPRAY | Freq: Once | RESPIRATORY_TRACT | Status: DC | PRN

## 2020-02-25 MED ORDER — EPINEPHRINE 0.3 MG/0.3ML IJ SOAJ: 0.3000 mg | Freq: Once | INTRAMUSCULAR | Status: DC | PRN

## 2020-02-25 MED ORDER — ALBUTEROL SULFATE HFA 108 (90 BASE) MCG/ACT IN AERS: 2.0000 | INHALATION_SPRAY | Freq: Four times a day (QID) | RESPIRATORY_TRACT | 1 refills | Status: DC | PRN

## 2020-02-25 MED ORDER — SODIUM CHLORIDE 0.9 % IV SOLN: INTRAVENOUS | Status: DC | PRN

## 2020-02-25 MED FILL — ALBUTEROL SUL 2.5 MG/3 ML S: (2.5 MG/3ML | 7 days supply | Qty: 90 | Fill #0

## 2020-02-25 NOTE — Progress Notes (Signed)
  Diagnosis: COVID-19  Physician: Patrick Wright  Procedure: Covid Infusion Clinic Med: casirivimab\imdevimab infusion - Provided patient with casirivimab\imdevimab fact sheet for patients, parents and caregivers prior to infusion.  Complications: No immediate complications noted.  Discharge: Discharged home   Chris [name] 02/25/2020  

## 2020-02-25 NOTE — Telephone Encounter (Signed)
Requested Prescriptions  Pending Prescriptions Disp Refills   albuterol (VENTOLIN HFA) 108 (90 Base) MCG/ACT inhaler 18 g 1    Sig: Inhale 2 puffs into the lungs every 6 (six) hours as needed for wheezing or shortness of breath.     Pulmonology:  Beta Agonists Failed - 02/25/2020 10:17 AM      Failed - One inhaler should last at least one month. If the patient is requesting refills earlier, contact the patient to check for uncontrolled symptoms.      Passed - Valid encounter within last 12 months    Recent Outpatient Visits          1 month ago Hospital discharge follow-up   [place], MD   3 months ago Hospital discharge follow-up   [place] [name], MD   4 months ago COPD (chronic obstructive pulmonary disease) with acute bronchitis [facility])   [place], [place], [place]   5 months ago COPD (chronic obstructive pulmonary disease) with acute bronchitis ([place])   [place], Cari S, PA-C   7 months ago COPD (chronic obstructive pulmonary disease) with acute bronchitis [facility])   [place], Vernia Buff, NP      Future Appointments            In 3 weeks Donato Heinz, MD [facility], [facility]   In 1 month [name], MD [place]

## 2020-02-25 NOTE — Telephone Encounter (Signed)
Refill sent.

## 2020-02-25 NOTE — Telephone Encounter (Signed)
Pt request refill   albuterol (PROVENTIL) (2.5 MG/3ML) 0.083% nebulizer solution  Pt states he is covid positive and needs breathing medication asap  [place], [place] [employer] Phone:  [phone]  Fax:  [phone]

## 2020-02-25 NOTE — Telephone Encounter (Signed)
We received a new rx for the [facility] inhaler, but the patient wants a refill on the nebulizing solution.  If appropriate, can we get a refill for the solution, the patient would like to pick up today.

## 2020-02-25 NOTE — Discharge Instructions (Signed)

## 2020-02-25 NOTE — Progress Notes (Signed)
I connected by phone with Chris [name] on 02/25/2020 at 2:09 PM to discuss the potential use of a new treatment for mild to moderate COVID-19 viral infection in non-hospitalized patients.  This patient is a 59 y.o. male that meets the FDA criteria for Emergency Use Authorization of COVID monoclonal antibody casirivimab/imdevimab.  Has a (+) direct SARS-CoV-2 viral test result  Has mild or moderate COVID-19   Is NOT hospitalized due to COVID-19  Is within 10 days of symptom onset  Has at least one of the high risk factor(s) for progression to severe COVID-19 and/or hospitalization as defined in EUA.  Specific high risk criteria : BMI > 25, Diabetes, Cardiovascular disease or hypertension and Chronic Lung Disease   I have spoken and communicated the following to the patient or parent/caregiver regarding COVID monoclonal antibody treatment:  1. FDA has authorized the emergency use for the treatment of mild to moderate COVID-19 in adults and pediatric patients with positive results of direct SARS-CoV-2 viral testing who are 61 years of age and older weighing at least 40 kg, and who are at high risk for progressing to severe COVID-19 and/or hospitalization.  2. The significant known and potential risks and benefits of COVID monoclonal antibody, and the extent to which such potential risks and benefits are unknown.  3. Information on available alternative treatments and the risks and benefits of those alternatives, including clinical trials.  4. Patients treated with COVID monoclonal antibody should continue to self-isolate and use infection control measures (e.g., wear mask, isolate, social distance, avoid sharing personal items, clean and disinfect "high touch" surfaces, and frequent handwashing) according to CDC guidelines.   5. The patient or parent/caregiver has the option to accept or refuse COVID monoclonal antibody treatment.  After reviewing this information with the patient, The  patient agreed to proceed with receiving casirivimab\imdevimab infusion and will be provided a copy of the Fact sheet prior to receiving the infusion. Chris [name] 02/25/2020 2:09 PM

## 2020-03-06 ENCOUNTER — Other Ambulatory Visit: Payer: Self-pay | Admitting: Pharmacist

## 2020-03-06 ENCOUNTER — Other Ambulatory Visit: Payer: Self-pay | Admitting: Internal Medicine

## 2020-03-06 DIAGNOSIS — J301 Allergic rhinitis due to pollen: Secondary | ICD-10-CM

## 2020-03-06 DIAGNOSIS — I5042 Chronic combined systolic (congestive) and diastolic (congestive) heart failure: Secondary | ICD-10-CM

## 2020-03-06 DIAGNOSIS — I10 Essential (primary) hypertension: Secondary | ICD-10-CM

## 2020-03-06 DIAGNOSIS — J439 Emphysema, unspecified: Secondary | ICD-10-CM

## 2020-03-06 DIAGNOSIS — J209 Acute bronchitis, unspecified: Secondary | ICD-10-CM

## 2020-03-06 DIAGNOSIS — E119 Type 2 diabetes mellitus without complications: Secondary | ICD-10-CM

## 2020-03-06 DIAGNOSIS — E785 Hyperlipidemia, unspecified: Secondary | ICD-10-CM

## 2020-03-06 MED ORDER — ALBUTEROL SULFATE (2.5 MG/3ML) 0.083% IN NEBU: 3.0000 mL | INHALATION_SOLUTION | Freq: Four times a day (QID) | RESPIRATORY_TRACT | 1 refills | Status: DC | PRN

## 2020-03-06 MED ORDER — ALBUTEROL SULFATE HFA 108 (90 BASE) MCG/ACT IN AERS: 2.0000 | INHALATION_SPRAY | Freq: Four times a day (QID) | RESPIRATORY_TRACT | 2 refills | Status: DC | PRN

## 2020-03-06 MED ORDER — BUDESONIDE-FORMOTEROL FUMARATE 160-4.5 MCG/ACT IN AERO: 2.0000 | INHALATION_SPRAY | Freq: Two times a day (BID) | RESPIRATORY_TRACT | 2 refills | Status: DC

## 2020-03-06 MED ORDER — ALBUTEROL SULFATE HFA 108 (90 BASE) MCG/ACT IN AERS: 2.0000 | INHALATION_SPRAY | Freq: Four times a day (QID) | RESPIRATORY_TRACT | 1 refills | Status: DC | PRN

## 2020-03-06 MED ORDER — ACCU-CHEK GUIDE W/DEVICE KIT: PACK | 0 refills | Status: DC

## 2020-03-06 MED ORDER — ASPIRIN 81 MG PO TBEC
81.0000 mg | DELAYED_RELEASE_TABLET | Freq: Every day | ORAL | 2 refills | Status: DC
Start: 2020-03-06 — End: 2020-07-01

## 2020-03-06 MED ORDER — SACUBITRIL-VALSARTAN 24-26 MG PO TABS: 1.0000 | ORAL_TABLET | Freq: Two times a day (BID) | ORAL | 2 refills | Status: DC

## 2020-03-06 MED ORDER — FUROSEMIDE 40 MG PO TABS: 60.0000 mg | ORAL_TABLET | Freq: Every day | ORAL | 2 refills | Status: DC

## 2020-03-06 MED ORDER — ISOSORB DINITRATE-HYDRALAZINE 20-37.5 MG PO TABS: 1.0000 | ORAL_TABLET | Freq: Three times a day (TID) | ORAL | 2 refills | Status: DC

## 2020-03-06 MED ORDER — ALLOPURINOL 100 MG PO TABS
200.0000 mg | ORAL_TABLET | Freq: Every day | ORAL | 2 refills | Status: DC
Start: 2020-03-06 — End: 2020-04-17

## 2020-03-06 MED ORDER — FLUTICASONE PROPIONATE 50 MCG/ACT NA SUSP: 1.0000 | Freq: Every day | NASAL | 2 refills | Status: DC | PRN

## 2020-03-06 MED ORDER — ACCU-CHEK GUIDE VI STRP
ORAL_STRIP | 12 refills | Status: DC
Start: 2020-03-06 — End: 2020-07-04

## 2020-03-06 MED ORDER — GLIPIZIDE 5 MG PO TABS: 2.5000 mg | ORAL_TABLET | Freq: Two times a day (BID) | ORAL | 2 refills | Status: DC

## 2020-03-06 MED ORDER — ATORVASTATIN CALCIUM 40 MG PO TABS: 40.0000 mg | ORAL_TABLET | Freq: Every day | ORAL | 2 refills | Status: DC

## 2020-03-06 MED ORDER — CARVEDILOL 25 MG PO TABS: 25.0000 mg | ORAL_TABLET | Freq: Two times a day (BID) | ORAL | 2 refills | Status: DC

## 2020-03-06 MED ORDER — COLCHICINE 0.6 MG PO TABS: ORAL_TABLET | ORAL | 2 refills | Status: DC

## 2020-03-06 MED ORDER — MONTELUKAST SODIUM 10 MG PO TABS: 10.0000 mg | ORAL_TABLET | Freq: Every day | ORAL | 2 refills | Status: DC

## 2020-03-06 MED FILL — ACCU-CHEK GUIDE MONITOR SYS: W/DEVICE | 30 days supply | Qty: 1 | Fill #0

## 2020-03-06 MED FILL — MONTELUKAST SOD 10 MG TAB: 10 | 30 days supply | Qty: 30 | Fill #0

## 2020-03-06 MED FILL — SYMBICORT 160-4.5 MCG INH: 160-4.5 | 30 days supply | Qty: 10 | Fill #0

## 2020-03-06 MED FILL — COLCHICINE 0.6 MG TABS: 0.6 | 30 days supply | Qty: 8 | Fill #0

## 2020-03-06 MED FILL — ALLOPURINOL 100 MG TABLET: 100 | 30 days supply | Qty: 60 | Fill #0

## 2020-03-06 MED FILL — BIDIL 20-37.5 MG TABS: 20-37.5 | 90 days supply | Qty: 90 | Fill #0

## 2020-03-06 MED FILL — ALBUTEROL SUL 2.5 MG/3 ML S: (2.5 MG/3ML | 7 days supply | Qty: 90 | Fill #0

## 2020-03-06 MED FILL — ENTRESTO 24 MG-26 MG TABLET: 24-26 | 30 days supply | Qty: 60 | Fill #0

## 2020-03-06 MED FILL — FLUTICASONE PROP 50 MCG SPR: 50 | 30 days supply | Qty: 16 | Fill #0

## 2020-03-06 MED FILL — ACCU-CHEK GUIDE TEST STRIP: 25 days supply | Qty: 100 | Fill #0

## 2020-03-06 MED FILL — PROAIR HFA 90 MCG INHALER: 108 (90 BAS | 25 days supply | Qty: 9 | Fill #0

## 2020-03-06 MED FILL — CARVEDILOL 25 MG TABLET: 25 | 30 days supply | Qty: 60 | Fill #0

## 2020-03-06 NOTE — Telephone Encounter (Signed)
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 approximately 13% higher for people identified as African-American. .           Failed - Last BP in normal range    BP Readings from Last 1 Encounters:  02/25/20 (!) 148/98          Passed - K in normal range and within 360 days    Potassium  Date Value Ref Range Status  01/14/2020 4.7 3.5 - 5.3 mmol/L Final  10/23/2013 4.9 3.5 - 5.1 mmol/L Final          Passed - Ca in normal range and within 360 days    Calcium  Date Value Ref Range Status  01/14/2020 9.2 8.6 - 10.3 mg/dL Final   Calcium, Total  Date Value Ref Range Status  10/23/2013 9.0 8.5 - 10.1 mg/dL Final   Calcium, Ion  Date Value Ref Range Status  12/25/2019 1.28 1.15 - 1.40 mmol/L Final          Passed - Na in normal range and within 360 days    Sodium  Date Value Ref Range Status  01/14/2020 139 135 - 146 mmol/L Final  11/07/2019 141 134 - 144 mmol/L Final  10/23/2013 137 136 - 145 mmol/L Final          Passed - Valid encounter within last 6 months    Recent Outpatient Visits           2 months ago Hospital discharge follow-up   [place], MD   4 months ago Hospital discharge follow-up   [place], MD   4 months ago COPD (chronic  obstructive pulmonary disease) with acute bronchitis [facility])   [place], [place], [place]   5 months ago COPD (chronic obstructive pulmonary disease) with acute bronchitis ([place])   [place] Mayers, Cari S, PA-C   8 months ago COPD (chronic obstructive pulmonary disease) with acute bronchitis ([place])   [place] [name], NP       Future Appointments             In 1 week Donato Heinz, MD [facility], [facility]   In 1 month [name], MD [place]              aspirin 81 MG EC tablet 30 tablet 0    Sig: Take 1 tablet (81 mg total) by mouth daily.      Analgesics:  NSAIDS - aspirin Passed - 03/06/2020  9:20 AM      Passed - Patient is not pregnant      Passed - Valid encounter within last 12 months    Recent Outpatient Visits           2 months ago Hospital discharge follow-up   [place] [name], MD   4 months ago Hospital discharge follow-up   [place] [name], MD   4 months ago COPD (chronic obstructive pulmonary disease) with acute bronchitis [facility])   [place], [place], [place]   5 months ago COPD (chronic obstructive pulmonary disease) with acute bronchitis ([place])   [place], Cari S, PA-C   8 months ago COPD (chronic obstructive pulmonary disease) with acute bronchitis [facility])   [place], Vernia Buff, NP       Future Appointments             In 1 week Donato Heinz, MD [facility] [place], [facility]   In 1 month [name], MD [place]
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
## 2020-03-07 ENCOUNTER — Telehealth: Payer: Self-pay | Admitting: Internal Medicine

## 2020-03-07 NOTE — Telephone Encounter (Signed)
Pt called to get a refill for medication he uses for Gout/ pt stated it has flared back up and his foot Is swollen/ pt is asking for RX to be sent in today if possible/ please call pt to advise

## 2020-03-10 NOTE — Telephone Encounter (Signed)
Contacted pt to make aware that rxs were sent on the 9/23. Pt states he understands and doesn't have any questions or concerns

## 2020-03-10 NOTE — Telephone Encounter (Signed)
Rx for Allopurinol and Colchicine 0.6 were both sent in on 9/23.

## 2020-03-14 ENCOUNTER — Telehealth: Payer: Self-pay | Admitting: Internal Medicine

## 2020-03-14 NOTE — Telephone Encounter (Signed)
Copied from [place] [id number]. Topic: General - Other >> Mar 14, 2020 11:31 AM Alanda Slim E wrote: Reason for CRM: 9.24.21 CMN form was faxed to office from [employer]/ please advise if received

## 2020-03-16 NOTE — Progress Notes (Deleted)
Cardiology Office Note:    Date:  03/16/2020   ID:  Chris [name], DOB Jul 30, 1960, MRN [id number]  PCP:  [name], MD  Cardiologist:  Donato Heinz, MD  Electrophysiologist:  None   Referring MD: [name], MD   No chief complaint on file.   History of Present Illness:    Chris [name] is a 59 y.o. 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  He has a history of nonischemic cardiomyopathy with EF as low as 20 to 25% and normal left heart catheterization in 2015 at [facility].  He was admitted to [facility] from 7/12 through 12/27/2019.  He presented with progressive shortness of breath and was found to be hypoxic to 80s on admission.  He had altered mental status on presentation, was found to have hypercapnic hypoxic respiratory failure and admitted to ICU for BiPAP.  He was markedly hypertensive and started on Cardene drip.  In addition, he was volume overloaded and diuresed with IV Lasix.  Presentation was thought to be multifactorial with COPD exacerbation and acute on chronic systolic/diastolic heart failure contributing.  He diuresed well and was transitioned to oral Lasix on discharge.  He was weaned off Cardene drip and started on Coreg, BiDil, and Entresto.    Echocardiogram 10/27/2019 showed EF 30 to 35%, diffuse hypokinesis with basal inferior akinesis, moderate LVH, grade 2 diastolic dysfunction, low normal RV function, mild MR.  Echocardiogram on 12/25/2019 shows LVEF 30 to 35%,  no significant change from prior study.  Since discharge from hospital,   he reports that he has been doing well.  Does report he has had some shortness of breath but denies any chest pain.  States that most exertion he does is walking around his home.  He has been using Lasix and states weight has been stable, running 275 to 90 pounds at home.   Denies any lightheadedness or syncope.  Reports rare palpitations.  Denies any lower extremity edema.  States that he last used cocaine over 1 month ago   Wt Readings from Last 3 Encounters:  01/14/20 271 lb 6.4 oz (123.1 kg)  01/08/20 (!) 273 lb 6.4 oz (124 kg)  12/28/19 276 lb 6.4 oz (125.4 kg)     Past Medical History:  Diagnosis Date  . CHF (congestive heart failure) ([place])   . Chronic kidney disease   . COPD (chronic obstructive pulmonary disease) ([place])   . Coronary artery disease   . Depression   . Diabetes mellitus without complication ([place])   . GERD (gastroesophageal reflux disease)   . Gout   . Hypertension   . Influenza A with respiratory manifestations   . Mental disorder     Past Surgical History:  Procedure Laterality Date  . ANKLE SURGERY    . CARDIAC CATHETERIZATION    . HERNIA REPAIR     x2  . SHOULDER SURGERY      Current Medications: No outpatient medications have been marked as taking for the 03/18/20 encounter (Appointment) with Donato Heinz, MD.     Allergies:   Patient has no known allergies.   Social History   Socioeconomic History  . Marital status: Single    Spouse name: Not on file  . Number of children: Not on file  . Years of education: Not on file  . Highest education level: Not on  file  Occupational History  . Not on file  Tobacco Use  . Smoking status: Current Every Day Smoker    Packs/day: 1.00    Years: 20.00    Pack years: 20.00    Types: Cigarettes  . Smokeless tobacco: Never Used  . Tobacco comment: less than 1 pack per day  Vaping Use  . Vaping Use: Never used  Substance and Sexual Activity  . Alcohol use: No  . Drug use: Yes    Frequency: 21.0 times per week    Types: Marijuana, Cocaine    Comment: no longer- Cocaine  . Sexual activity: Not on file  Other Topics Concern  . Not on file  Social History Narrative   ** Merged History Encounter **       Social Determinants of Health   Financial  Resource Strain:   . Difficulty of Paying Living Expenses: Not on file  Food Insecurity:   . Worried About Charity fundraiser in the Last Year: Not on file  . Ran Out of Food in the Last Year: Not on file  Transportation Needs:   . Lack of Transportation (Medical): Not on file  . Lack of Transportation (Non-Medical): Not on file  Physical Activity:   . Days of Exercise per Week: Not on file  . Minutes of Exercise per Session: Not on file  Stress:   . Feeling of Stress : Not on file  Social Connections:   . Frequency of Communication with Friends and Family: Not on file  . Frequency of Social Gatherings with Friends and Family: Not on file  . Attends Religious Services: Not on file  . Active Member of Clubs or Organizations: Not on file  . Attends Archivist Meetings: Not on file  . Marital Status: Not on file     Family History: The patient's family history includes Diabetes in his mother; HIV in his brother; Healthy in his daughter and son; Heart disease in his father.  ROS:   Please see the history of present illness.     All other systems reviewed and are negative.  EKGs/Labs/Other Studies Reviewed:    The following studies were reviewed today:   EKG:  EKG is  ordered today.  The ekg ordered today demonstrates sinus rhythm, rate 75, LVH, < 1 mm ST depressions in inferior leads, nonspecific T wave flattening  Recent Labs: 10/26/2019: TSH 1.592 12/24/2019: B Natriuretic Peptide 1,530.4 12/25/2019: Hemoglobin 16.7; Magnesium 1.9; Platelets 196 01/14/2020: ALT 11; BUN 24; Creat 1.97; Potassium 4.7; Sodium 139  Recent Lipid Panel    Component Value Date/Time   CHOL 176 12/25/2019 0632   CHOL 160 11/07/2019 0918   CHOL 157 10/21/2013 0435   TRIG 91 12/25/2019 0632   TRIG 117 10/21/2013 0435   HDL 37 (L) 12/25/2019 0632   HDL 40 11/07/2019 0918   HDL 30 (L) 10/21/2013 0435   CHOLHDL 4.8 12/25/2019 0632   VLDL 18 12/25/2019 0632   VLDL 23 10/21/2013 0435    LDLCALC 121 (H) 12/25/2019 0632   LDLCALC 92 11/07/2019 0918   LDLCALC 104 (H) 10/21/2013 0435    Physical Exam:    VS:  There were no vitals taken for this visit.    Wt Readings from Last 3 Encounters:  01/14/20 271 lb 6.4 oz (123.1 kg)  01/08/20 (!) 273 lb 6.4 oz (124 kg)  12/28/19 276 lb 6.4 oz (125.4 kg)     GEN: in no acute distress HEENT: Normal NECK:  No JVD; No carotid bruits LYMPHATICS: No lymphadenopathy CARDIAC: RRR, no murmurs, rubs, gallops RESPIRATORY:  Clear to auscultation without rales, wheezing or rhonchi  ABDOMEN: Soft, non-tender, non-distended MUSCULOSKELETAL:  No edema; No deformity  SKIN: Warm and dry NEUROLOGIC:  Alert and oriented x 3 PSYCHIATRIC:  Normal affect   ASSESSMENT:    No diagnosis found. PLAN:    Chronic combined systolic and diastolic heart failure: EF 30 to 35%.  H/o cocaine use.  Normal cath in 2015, EF was as low as 20-25%. -Continue carvedilol 25 mg twice daily -Continue BiDil 20-37.5 mg 3 times daily -Continue Entresto 24-26 mg twice daily -Continue Lasix 60 mg daily.   -Will check BMET/magnesium and plan to add spironolactone if stable labs.  Will schedule follow-up in pharmacy clinic in 2 weeks to continue to titrate heart failure meds  Hypertension: Admission in July 2021 with hypertensive emergency, requiring Cardene drip.  Was weaned off and started on Coreg, BiDil, Entresto.    CKD stage IIIb: Creatinine baseline 1.9-2.2  Hyperlipidemia: On atorvastatin 40 mg daily  T2DM: On glipizide  Cocaine use: Patient counseled on the risks of ongoing cocaine use and cessation strongly recommended  RTC in 2 months    Medication Adjustments/Labs and Tests Ordered: Current medicines are reviewed at length with the patient today.  Concerns regarding medicines are outlined above.  No orders of the defined types were placed in this encounter.  No orders of the defined types were placed in this encounter.   There are no Patient  Instructions on file for this visit.   Signed, Donato Heinz, MD  03/16/2020 10:40 PM    [facility]

## 2020-03-18 ENCOUNTER — Ambulatory Visit: Payer: Medicaid Other | Admitting: Cardiology

## 2020-03-18 NOTE — Telephone Encounter (Signed)
Looked in Dr. Wynetta Emery folder and don't see anything in regards to pt. Contacted [employer] and spoke to [place] and provided correct fax number they will fax form over

## 2020-03-21 NOTE — Telephone Encounter (Signed)
Received paperwork and Dr. Wynetta Emery wanted me to reach out to the pt and see if he has nebulizer machine. Contacted pt and per pt he had a machine and it got messed up so he is requesting a new machine.

## 2020-04-04 ENCOUNTER — Other Ambulatory Visit: Payer: Self-pay | Admitting: *Deleted

## 2020-04-04 ENCOUNTER — Other Ambulatory Visit: Payer: Self-pay

## 2020-04-04 NOTE — Patient Instructions (Signed)
Visit Information  Thank you for taking the time to talk with me today. I will follow up with you on 04/18/20. The pharmacist and Care Guide from my team will follow up with you for medication review and assistance with transportation to appointments. You can call the number below to arrange transportation.  Chris [name] was given information about Medicaid Managed Care team care coordination services as a part of their Healthy [facility] Medicaid benefit. Chris [name] verbally consented to engagement with the [facility] Managed Care team.   For questions related to your Healthy [facility] health plan, please call: [phone] or visit the homepage here: [email or web address]  If you would like to schedule transportation through your Healthy [facility] plan, please call the following number at least 2 days in advance of your appointment: [phone]  Goals Addressed              This Visit's Progress   .  "I need to learn how to check my blood sugar" (pt-stated)        CARE PLAN ENTRY Medicaid Managed Care (see longtitudinal plan of care for additional care plan information)  Objective:  Lab Results  Component Value Date   HGBA1C 6.8 (H) 12/25/2019 .   Lab Results  Component Value Date   CREATININE 1.97 (H) 01/14/2020   CREATININE 2.20 (H) 12/27/2019   CREATININE 1.83 (H) 12/26/2019   . Patient reported cbg findings: no findings reported  Current Barriers:  [name] Knowledge Deficits related to basic Diabetes pathophysiology and self care/management. This is a new diagnosis. Patient does not understand how to check his blood sugar. Patient is unsure of his medications and if he is taking them correctly. . Literacy barriers-Patient reports that his reading and writing ability is limited. . Does not use cbg meter  Case Manager Clinical Goal(s):  [name] Over the next 30 days, patient will demonstrate improved adherence to prescribed treatment plan for  diabetes self care/management as evidenced by:  [name] Learn to use glucometer and  monitor blood sugar . daily monitoring and recording of CBG as directed by provider . adherence to ADA/ carb modified diet . exercise 7 days/week . adherence to prescribed medication regimen   Interventions:  . Provided education to patient about basic DM disease process . Discussed plans with patient for ongoing care management follow up and provided patient with direct contact information for care management team . Sent mychart activation code, patient will have his sister help with activation . Provided patient with written educational materials related to hypo and hyperglycemia and importance of correct treatment . Reviewed scheduled/upcoming provider appointments including: 04/17/20 @ 3:10 with Dr. Wynetta Emery . Encouraged patient to take all medications and glucometer and supplies with him to his next appointment . Advised patient, providing education and rationale, to check cbg first thing in the morning and before meals and record, calling provider for findings outside established parameters. . Referral made to pharmacy team for assistance with medication review . Referral made to community resources care guide team for assistance with transportation . Collaborate with Dr. Wynetta Emery for referral for Diabetic education.-Patient expressed interest in attending  Patient Self Care Activities:  . UNABLE to independently monitor blood sugar . Attends all scheduled provider appointments . Patient will have his sister help him with educational information provided re: diabetes, diabetic diet and glucose monitoring  RNCM will send all information via mychart and mail-per patient request, follow up with a telephone visit on 04/18/20 @ 10:30   Initial  goal documentation        Please see education materials related to Diabetes provided by MyChart link. and as [employer] account planner.     Blood Glucose Monitoring,  Adult Monitoring your blood sugar (glucose) is an important part of managing your diabetes (diabetes mellitus). Blood glucose monitoring involves checking your blood glucose as often as directed and keeping a record (log) of your results over time. Checking your blood glucose regularly and keeping a blood glucose log can:  Help you and your health care provider adjust your diabetes management plan as needed, including your medicines or insulin.  Help you understand how food, exercise, illnesses, and medicines affect your blood glucose.  Let you know what your blood glucose is at any time. You can quickly find out if you have low blood glucose (hypoglycemia) or high blood glucose (hyperglycemia). Your health care provider will set individualized treatment goals for you. Your goals will be based on your age, other medical conditions you have, and how you respond to diabetes treatment. Generally, the goal of treatment is to maintain the following blood glucose levels:  Before meals (preprandial): 80-130 mg/dL (4.4-7.2 mmol/L).  After meals (postprandial): below 180 mg/dL (10 mmol/L).  A1c level: less than 7%. Supplies needed:  Blood glucose meter.  Test strips for your meter. Each meter has its own strips. You must use the strips that came with your meter.  A needle to prick your finger (lancet). Do not use a lancet more than one time.  A device that holds the lancet (lancing device).  A journal or log book to write down your results. How to check your blood glucose  1. Wash your hands with soap and water. 2. Prick the side of your finger (not the tip) with the lancet. Use a different finger each time. 3. Gently rub the finger until a small drop of blood appears. 4. Follow instructions that come with your meter for inserting the test strip, applying blood to the strip, and using your blood glucose meter. 5. Write down your result and any notes. Some meters allow you to use areas of  your body other than your finger (alternative sites) to test your blood. The most common alternative sites are:  Forearm.  Thigh.  Palm of the hand. If you think you may have hypoglycemia, or if you have a history of not knowing when your blood glucose is getting low (hypoglycemia unawareness), do not use alternative sites. Use your finger instead. Alternative sites may not be as accurate as the fingers, because blood flow is slower in these areas. This means that the result you get may be delayed, and it may be different from the result that you would get from your finger. Follow these instructions at home: Blood glucose log   Every time you check your blood glucose, write down your result. Also write down any notes about things that may be affecting your blood glucose, such as your diet and exercise for the day. This information can help you and your health care provider: ? Look for patterns in your blood glucose over time. ? Adjust your diabetes management plan as needed.  Check if your meter allows you to download your records to a computer. Most glucose meters store a record of glucose readings in the meter. If you have type 1 diabetes:  Check your blood glucose 2 or more times a day.  Also check your blood glucose: ? Before every insulin injection. ? Before and after exercise. ?  Before meals. ? 2 hours after a meal. ? Occasionally between 2:00 a.m. and 3:00 a.m., as directed. ? Before potentially dangerous tasks, like driving or using heavy machinery. ? At bedtime.  You may need to check your blood glucose more often, up to 6-10 times a day, if you: ? Use an insulin pump. ? Need multiple daily injections (MDI). ? Have diabetes that is not well-controlled. ? Are ill. ? Have a history of severe hypoglycemia. ? Have hypoglycemia unawareness. If you have type 2 diabetes:  If you take insulin or other diabetes medicines, check your blood glucose 2 or more times a day.  If  you are on intensive insulin therapy, check your blood glucose 4 or more times a day. Occasionally, you may also need to check between 2:00 a.m. and 3:00 a.m., as directed.  Also check your blood glucose: ? Before and after exercise. ? Before potentially dangerous tasks, like driving or using heavy machinery.  You may need to check your blood glucose more often if: ? Your medicine is being adjusted. ? Your diabetes is not well-controlled. ? You are ill. General tips  Always keep your supplies with you.  If you have questions or need help, all blood glucose meters have a 24-hour "hotline" phone number that you can call. You may also contact your health care provider.  After you use a few boxes of test strips, adjust (calibrate) your blood glucose meter by following instructions that came with your meter. Contact a health care provider if:  Your blood glucose is at or above 240 mg/dL (13.3 mmol/L) for 2 days in a row.  You have been sick or have had a fever for 2 days or longer, and you are not getting better.  You have any of the following problems for more than 6 hours: ? You cannot eat or drink. ? You have nausea or vomiting. ? You have diarrhea. Get help right away if:  Your blood glucose is lower than 54 mg/dL (3 mmol/L).  You become confused or you have trouble thinking clearly.  You have difficulty breathing.  You have moderate or large ketone levels in your urine. Summary  Monitoring your blood sugar (glucose) is an important part of managing your diabetes (diabetes mellitus).  Blood glucose monitoring involves checking your blood glucose as often as directed and keeping a record (log) of your results over time.  Your health care provider will set individualized treatment goals for you. Your goals will be based on your age, other medical conditions you have, and how you respond to diabetes treatment.  Every time you check your blood glucose, write down your result.  Also write down any notes about things that may be affecting your blood glucose, such as your diet and exercise for the day. This information is not intended to replace advice given to you by your health care provider. Make sure you discuss any questions you have with your health care provider. Document Revised: 03/24/2018 Document Reviewed: 11/10/2015 Elsevier Patient Education  [place].  Type 2 Diabetes Mellitus, Diagnosis, Adult Type 2 diabetes (type 2 diabetes mellitus) is a long-term (chronic) disease. It may be caused by one or both of these problems:  Your pancreas does not make enough of a hormone called insulin.  Your body does not react in a normal way to insulin that it makes. Insulin lets sugars (glucose) go into cells in your body. This gives you energy. If you have type 2 diabetes, sugars cannot  get into cells. This causes high blood sugar (hyperglycemia). Your doctor will set treatment goals for you. Generally, you should have these blood sugar levels:  Before meals (preprandial): 80-130 mg/dL (4.4-7.2 mmol/L).  After meals (postprandial): below 180 mg/dL (10 mmol/L).  A1c (hemoglobin A1c) level: less than 7%. Follow these instructions at home: Questions to ask your doctor  You may want to ask these questions: ? Do I need to meet with a diabetes educator? ? Where can I find a support group for people with diabetes? ? What equipment will I need to care for myself at home? ? What diabetes medicines do I need? When should I take them? ? How often do I need to check my blood sugar? ? What number can I call if I have questions? ? When is my next doctor's visit? General instructions  Take over-the-counter and prescription medicines only as told by your doctor.  Keep all follow-up visits as told by your doctor. This is important. Contact a doctor if:  Your blood sugar is at or above 240 mg/dL (13.3 mmol/L) for 2 days in a row.  You have been sick for 2 days  or more, and you are not getting better.  You have had a fever for 2 days or more, and you are not getting better.  You have any of these problems for more than 6 hours: ? You cannot eat or drink. ? You feel sick to your stomach (nauseous). ? You throw up (vomit). ? You have watery poop (diarrhea). Get help right away if:  Your blood sugar is lower than 54 mg/dL (3 mmol/L).  You get confused.  You have trouble: ? Thinking clearly. ? Breathing.  You have moderate or large ketone levels in your pee (urine). Summary  Type 2 diabetes is a long-term (chronic) disease. Your pancreas may not make enough of a hormone called insulin, or your body may not react normally to insulin that it makes.  Take over-the-counter and prescription medicines only as told by your doctor.  Keep all follow-up visits as told by your doctor. This is important. This information is not intended to replace advice given to you by your health care provider. Make sure you discuss any questions you have with your health care provider. Document Revised: 07/29/2017 Document Reviewed: 07/04/2015 Elsevier Patient Education  2020 [employer].   Patient verbalizes understanding of instructions provided today.   The patient has been provided with contact information for the Managed Medicaid care management team and has been advised to call with any health related questions or concerns.  Telephone follow up appointment with Managed Medicaid care management team member scheduled for:04/18/20 @ 10:30  [name] RN, BSN [place]  [employer] manager

## 2020-04-04 NOTE — Patient Outreach (Signed)
Care Coordination - Case Manager  04/04/2020  Chris [name] 1961-02-15 [id number]  Subjective:  Chris [name] is an 59 y.o. year old male who is a primary patient of Chris Pier, MD.  Chris [name] was given information about Medicaid Managed Care team care coordination services today. [name] agreed to services and verbal consent obtained  Review of patient status, laboratory and other test data was performed as part of evaluation for provision of services.  SDOH: SDOH Screenings   Alcohol Screen:   . Last Alcohol Screening Score (AUDIT): Not on file  Depression (PHQ2-9): Medium Risk  . PHQ-2 Score: 14  Financial Resource Strain:   Tobacco Use: High Risk  . Smoking Tobacco Use: Current Every Day Smoker  . Smokeless Tobacco Use: Never Used  Transportation Needs: Unmet Transportation Needs  . Lack of Transportation (Medical): Yes  . Lack of Transportation (Non-Medical): Yes   SDOH Interventions     Most Recent Value  SDOH Interventions  Transportation Interventions Other (Comment)  [[employer] transportation information provided to patient.]      Objective:    No Known Allergies  Medications:    Medications Reviewed Today    Reviewed by Gibson Ramp, RN (Registered Nurse) on 02/22/20 at 1531  Med List Status: <None>  Medication Order Taking? Sig Documenting Provider Last Dose Status Informant  albuterol (PROVENTIL) (2.5 MG/3ML) 0.083% nebulizer solution [id number]  Take 3 mLs by nebulization every 6 (six) hours as needed for shortness of breath. Regalado, Belkys A, MD  Active   albuterol (VENTOLIN HFA) 108 (90 Base) MCG/ACT inhaler [id number]  Inhale 2 puffs into the lungs every 6 (six) hours as needed for wheezing or shortness of breath. Mayers, Loraine Grip, PA-C  Active Friend           Med Note Ebony Hail, AMBER B   Tue Dec 25, 2019  9:52 AM) LF 10/31/19 25DS  allopurinol (ZYLOPRIM) 100 MG tablet [id number]  Take 2 tablets (200 mg total) by mouth daily.  Bo Merino, MD  Active   aspirin EC 81 MG EC tablet [id number]  Take 1 tablet (81 mg total) by mouth daily. Swayze, Ava, DO  Active   atorvastatin (LIPITOR) 40 MG tablet [id number]  Take 1 tablet (40 mg total) by mouth daily at 6 PM. Regalado, Belkys A, MD  Active   blood glucose meter kit and supplies [id number]  Dispense based on patient and insurance preference. Use up to four times daily as directed. (FOR ICD-10 E10.9, E11.9). Regalado, Belkys A, MD  Active   Blood Glucose Monitoring Suppl (ACCU-CHEK GUIDE) w/Device KIT [id number]  USE AS DIRECTED [provider]  Active   Blood Glucose Monitoring Suppl (TRUE METRIX METER) w/Device KIT [id number]  Use as directed Chris Pier, MD  Active   budesonide-formoterol [facility]) 160-4.5 MCG/ACT inhaler [id number]  Inhale 2 puffs into the lungs 2 (two) times daily. Chris Pier, MD  Active   carvedilol (COREG) 25 MG tablet [id number]  Take 1 tablet (25 mg total) by mouth 2 (two) times daily with a meal. Regalado, Belkys A, MD  Active   colchicine 0.6 MG tablet [id number]  Take half tablet by mouth every other day. Bo Merino, MD  Active   fluticasone Asencion Islam) 50 MCG/ACT nasal spray [id number]  Place 1 spray into both nostrils daily as needed for allergies or rhinitis. Mayers, Loraine Grip, PA-C  Active Friend           Med Note Ebony Hail, AMBER  B   Tue Dec 25, 2019  9:53 AM) LF 10/31/19 30DS  furosemide (LASIX) 40 MG tablet [id number]  Take 1.5 tablets (60 mg total) by mouth daily. Regalado, Belkys A, MD  Active   glipiZIDE (GLUCOTROL) 5 MG tablet [id number]  Take 0.5 tablets (2.5 mg total) by mouth 2 (two) times daily before a meal. Regalado, Belkys A, MD  Active   glucose blood (ACCU-CHEK GUIDE) test strip [id number]  Use as instructed. Check blood glucose by fingerstick once per day. Regalado, Belkys A, MD  Active   glucose blood (TRUE METRIX BLOOD GLUCOSE TEST) test strip [id number]  Use as instructed Chris Pier, MD  Active     isosorbide-hydrALAZINE (BIDIL) 20-37.5 MG tablet [id number]  Take 1 tablet by mouth 3 (three) times daily. Elmarie Shiley, MD  Active   Lancet Devices (LANCING DEVICE) MISC [id number]  1 Device by Does not apply route daily. Regalado, Belkys A, MD  Active   montelukast (SINGULAIR) 10 MG tablet [id number]  Take 1 tablet (10 mg total) by mouth at bedtime. Regalado, Belkys A, MD  Active   predniSONE (DELTASONE) 20 MG tablet [id number]  Take 1 tablet (20 mg total) by mouth daily with breakfast. Regalado, Belkys A, MD  Active   sacubitril-valsartan (ENTRESTO) 24-26 MG [id number]  Take 1 tablet by mouth 2 (two) times daily. Chris Pier, MD  Active   TRUEplus Lancets 28G MISC [id number]  Use as directed Chris Pier, MD  Active           Assessment:   Goals Addressed              This Visit's Progress   .  "I need to learn how to check my blood sugar" (pt-stated)        CARE PLAN ENTRY Medicaid Managed Care (see longtitudinal plan of care for additional care plan information)  Objective:  Lab Results  Component Value Date   HGBA1C 6.8 (H) 12/25/2019 .   Lab Results  Component Value Date   CREATININE 1.97 (H) 01/14/2020   CREATININE 2.20 (H) 12/27/2019   CREATININE 1.83 (H) 12/26/2019   . Patient reported cbg findings: no findings reported  Current Barriers:  [name] Knowledge Deficits related to basic Diabetes pathophysiology and self care/management. This is a new diagnosis. Patient does not understand how to check his blood sugar. Patient is unsure of his medications and if he is taking them correctly. . Literacy barriers-Patient reports that his reading and writing ability is limited. . Does not use cbg meter  Case Manager Clinical Goal(s):  [name] Over the next 30 days, patient will demonstrate improved adherence to prescribed treatment plan for diabetes self care/management as evidenced by:  [name] Learn to use glucometer and  monitor blood sugar . daily monitoring and  recording of CBG as directed by provider . adherence to ADA/ carb modified diet . exercise 7 days/week . adherence to prescribed medication regimen   Interventions:  . Provided education to patient about basic DM disease process . Discussed plans with patient for ongoing care management follow up and provided patient with direct contact information for care management team . Sent mychart activation code, patient will have his sister help with activation . Provided patient with written educational materials related to hypo and hyperglycemia and importance of correct treatment . Reviewed scheduled/upcoming provider appointments including: 04/17/20 @ 3:10 with Dr. Wynetta Emery . Encouraged patient to take all medications and glucometer and supplies with him to his next appointment .  Advised patient, providing education and rationale, to check cbg first thing in the morning and before meals and record, calling provider for findings outside established parameters. . Referral made to pharmacy team for assistance with medication review . Referral made to community resources care guide team for assistance with transportation . Collaborate with Dr. Wynetta Emery for referral for Diabetic education.-Patient expressed interest in attending  Patient Self Care Activities:  . UNABLE to independently monitor blood sugar . Attends all scheduled provider appointments . Patient will have his sister help him with educational information provided re: diabetes, diabetic diet and glucose monitoring  RNCM will send all information via mychart and mail-per patient request, follow up with a telephone visit on 04/18/20 @ 10:30   Initial goal documentation        Plan: RNCM will send all information via mychart and mail, follow up with a telephone visit on 04/18/20 @ 10:30  [name] RN, [place] RN Care Coordinator

## 2020-04-11 ENCOUNTER — Other Ambulatory Visit: Payer: Self-pay

## 2020-04-11 NOTE — Patient Instructions (Signed)
Visit Information  Chris [name] was given information about Medicaid Managed Care team care coordination services as a part of their Healthy [facility] Medicaid benefit. Chris [name] verbally consented to engagement with the [facility] Managed Care team.   For questions related to your Healthy [facility] health plan, please call: [phone] or visit the homepage here: [email or web address]  If you would like to schedule transportation through your Healthy [facility] plan, please call the following number at least 2 days in advance of your appointment: [phone]  Goals Addressed   None       The Managed Medicaid care management team will reach out to the patient again over the next 7 days.   [name], [name], [place]  High Risk Managed Medicaid Team

## 2020-04-11 NOTE — Patient Outreach (Signed)
Care Coordination  04/11/2020  NAJEE MANNINEN 1960-08-16 [id number]  An unsuccessful telephone outreach was attempted today. The patient was referred to the case management team for assistance with care management and care coordination.  Patient stated to give him a call next week.  Follow Up Plan: The Managed Medicaid care management team will reach out to the patient again over the next 7 days.   [name], [name], [place]  High Risk Managed Medicaid Team

## 2020-04-14 ENCOUNTER — Encounter: Payer: Self-pay | Admitting: *Deleted

## 2020-04-14 ENCOUNTER — Other Ambulatory Visit: Payer: Self-pay | Admitting: *Deleted

## 2020-04-14 ENCOUNTER — Other Ambulatory Visit: Payer: Self-pay

## 2020-04-14 NOTE — Patient Outreach (Signed)
Care Coordination  04/14/2020  Chris [name] Jan 07, 1961 [id number]   Chris [name],   Thank you for taking the time to speak with me today. I am sending the diabetic information that we discussed. This take the time to look over this information and have your sister look over it with you. Please let me know if you have any questions.  [name] RN, BSN [place]  [employer] manager

## 2020-04-14 NOTE — Patient Outreach (Deleted)
Care Coordination  04/14/2020  Chris [name] February 26, 1961 [id number]   Mr. Flax,   Thank you for taking the time to speak with me today. I am sending the diabetic information that we discussed. This take the time to look over this information and have your sister look over it with you. Please let me know if you have any questions.  [name] RN, BSN [place]  [employer] manager

## 2020-04-14 NOTE — Patient Instructions (Signed)
Visit Information  Chris [name] was given information about Medicaid Managed Care team care coordination services as a part of their Healthy [facility] Medicaid benefit. [name] verbally consented to engagement with the [facility] Managed Care team.   For questions related to your Healthy [facility] health plan, please call: [phone] or visit the homepage here: [email or web address]  If you would like to schedule transportation through your Healthy [facility] plan, please call the following number at least 2 days in advance of your appointment: [phone]    Telephone follow up appointment with Managed Medicaid care management team member scheduled for:04/18/20 @10 :30am  [name] RN, BSN [place] RN Care Coordinator

## 2020-04-14 NOTE — Patient Outreach (Signed)
Care Coordination  04/14/2020  Chris [name] 11-27-1960 [id number]    Care Management   Follow Up Note   04/14/2020 Name: Chris [name] MRN: [id number] DOB: [birth year]  Referred by: [name], MD Reason for referral : High Risk Managed Medicaid (RNCM follow up outreach)   [name] is a 59 y.o. year old male who is a primary care patient of Wilver, Tignor, MD. The care management team was consulted for assistance with care management and care coordination needs and consented to MM Care Coordination and Care Management services.   I connected with [name] today to remind him of his appointment with Dr. Wynetta Emery on 04/17/20 and realized this is a virtual appointment. In discussion with [name] we concluded that he felt it would be most beneficial for him to have an in person visit for education regarding his diabetes and using his glucometer. [name] will call today and inquire about changing the virtual appointment to an office visit.  Telephone follow up appointment with care management team member scheduled for:04/18/20 @ 10:30am  [name] RN, [place] RN Care Coordinator

## 2020-04-17 ENCOUNTER — Other Ambulatory Visit: Payer: Self-pay

## 2020-04-17 ENCOUNTER — Ambulatory Visit: Payer: Medicaid Other | Attending: Internal Medicine | Admitting: Internal Medicine

## 2020-04-17 ENCOUNTER — Other Ambulatory Visit: Payer: Self-pay | Admitting: Internal Medicine

## 2020-04-17 DIAGNOSIS — I10 Essential (primary) hypertension: Secondary | ICD-10-CM

## 2020-04-17 DIAGNOSIS — E785 Hyperlipidemia, unspecified: Secondary | ICD-10-CM

## 2020-04-17 DIAGNOSIS — J439 Emphysema, unspecified: Secondary | ICD-10-CM | POA: Diagnosis not present

## 2020-04-17 DIAGNOSIS — F17200 Nicotine dependence, unspecified, uncomplicated: Secondary | ICD-10-CM | POA: Diagnosis not present

## 2020-04-17 DIAGNOSIS — I5042 Chronic combined systolic (congestive) and diastolic (congestive) heart failure: Secondary | ICD-10-CM

## 2020-04-17 DIAGNOSIS — E669 Obesity, unspecified: Secondary | ICD-10-CM

## 2020-04-17 DIAGNOSIS — E1169 Type 2 diabetes mellitus with other specified complication: Secondary | ICD-10-CM

## 2020-04-17 DIAGNOSIS — E119 Type 2 diabetes mellitus without complications: Secondary | ICD-10-CM

## 2020-04-17 DIAGNOSIS — R0989 Other specified symptoms and signs involving the circulatory and respiratory systems: Secondary | ICD-10-CM

## 2020-04-17 MED ORDER — ACCU-CHEK GUIDE W/DEVICE KIT: PACK | 0 refills | Status: DC

## 2020-04-17 MED ORDER — ISOSORB DINITRATE-HYDRALAZINE 20-37.5 MG PO TABS: 1.0000 | ORAL_TABLET | Freq: Three times a day (TID) | ORAL | 4 refills | Status: DC

## 2020-04-17 MED ORDER — ATORVASTATIN CALCIUM 40 MG PO TABS: 40.0000 mg | ORAL_TABLET | Freq: Every day | ORAL | 2 refills | Status: DC

## 2020-04-17 MED ORDER — ALBUTEROL SULFATE (2.5 MG/3ML) 0.083% IN NEBU: 3.0000 mL | INHALATION_SOLUTION | Freq: Four times a day (QID) | RESPIRATORY_TRACT | 1 refills | Status: DC | PRN

## 2020-04-17 MED ORDER — BUDESONIDE-FORMOTEROL FUMARATE 160-4.5 MCG/ACT IN AERO: 2.0000 | INHALATION_SPRAY | Freq: Two times a day (BID) | RESPIRATORY_TRACT | 2 refills | Status: DC

## 2020-04-17 MED ORDER — GLIPIZIDE 5 MG PO TABS: 2.5000 mg | ORAL_TABLET | Freq: Two times a day (BID) | ORAL | 4 refills | Status: DC

## 2020-04-17 MED ORDER — CARVEDILOL 25 MG PO TABS: 25.0000 mg | ORAL_TABLET | Freq: Two times a day (BID) | ORAL | 4 refills | Status: DC

## 2020-04-17 MED ORDER — TRUE METRIX METER W/DEVICE KIT: PACK | 0 refills | Status: DC

## 2020-04-17 MED ORDER — BLOOD GLUCOSE METER KIT: PACK | 0 refills | Status: DC

## 2020-04-17 MED ORDER — ALLOPURINOL 100 MG PO TABS
200.0000 mg | ORAL_TABLET | Freq: Every day | ORAL | 4 refills | Status: DC
Start: 2020-04-17 — End: 2020-07-01

## 2020-04-17 MED ORDER — ALBUTEROL SULFATE HFA 108 (90 BASE) MCG/ACT IN AERS: 2.0000 | INHALATION_SPRAY | Freq: Four times a day (QID) | RESPIRATORY_TRACT | 2 refills | Status: DC | PRN

## 2020-04-17 MED ORDER — MONTELUKAST SODIUM 10 MG PO TABS: 10.0000 mg | ORAL_TABLET | Freq: Every day | ORAL | 2 refills | Status: DC

## 2020-04-17 MED ORDER — SACUBITRIL-VALSARTAN 24-26 MG PO TABS: 1.0000 | ORAL_TABLET | Freq: Two times a day (BID) | ORAL | 4 refills | Status: DC

## 2020-04-17 MED ORDER — FUROSEMIDE 40 MG PO TABS: 60.0000 mg | ORAL_TABLET | Freq: Every day | ORAL | 2 refills | Status: DC
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
## 2020-04-17 NOTE — Progress Notes (Signed)
Has chest congestion   Needs refill on medications.

## 2020-04-18 ENCOUNTER — Other Ambulatory Visit: Payer: Self-pay | Admitting: *Deleted

## 2020-04-18 ENCOUNTER — Other Ambulatory Visit: Payer: Self-pay

## 2020-04-18 ENCOUNTER — Ambulatory Visit: Payer: Medicaid Other | Attending: Internal Medicine

## 2020-04-18 DIAGNOSIS — R0989 Other specified symptoms and signs involving the circulatory and respiratory systems: Secondary | ICD-10-CM | POA: Diagnosis not present

## 2020-04-18 DIAGNOSIS — Z20822 Contact with and (suspected) exposure to covid-19: Secondary | ICD-10-CM

## 2020-04-18 MED FILL — ACCU-CHEK SOFTCLIX LANCETS: 30 days supply | Qty: 100 | Fill #0

## 2020-04-18 MED FILL — ACCU-CHEK GUIDE TEST STRIP: 25 days supply | Qty: 100 | Fill #1

## 2020-04-18 MED FILL — FUROSEMIDE 40 MG TAB: 40 | 30 days supply | Qty: 45 | Fill #0

## 2020-04-18 MED FILL — SYMBICORT 160-4.5 MCG INH: 160-4.5 | 30 days supply | Qty: 10 | Fill #0

## 2020-04-18 MED FILL — ATORVASTATIN CALCIUM 40 MG: 40 | 30 days supply | Qty: 30 | Fill #0

## 2020-04-18 MED FILL — CARVEDILOL 25 MG TABLET: 25 | 30 days supply | Qty: 60 | Fill #0

## 2020-04-18 MED FILL — MONTELUKAST SOD 10 MG TAB: 10 | 30 days supply | Qty: 30 | Fill #0

## 2020-04-18 MED FILL — PROAIR HFA 90 MCG INHALER: 108 (90 BAS | 25 days supply | Qty: 9 | Fill #0

## 2020-04-18 MED FILL — BIDIL 20-37.5 MG TABS: 20-37.5 | 30 days supply | Qty: 90 | Fill #0

## 2020-04-18 MED FILL — ALBUTEROL SUL 2.5 MG/3 ML S: (2.5 MG/3ML | 10 days supply | Qty: 90 | Fill #0

## 2020-04-18 MED FILL — ACCU-CHEK GUIDE W/DEVICE KI: W/DEVICE | 1 days supply | Qty: 1 | Fill #0

## 2020-04-18 MED FILL — glipiZIDE 5 MG TABS: 5 | 30 days supply | Qty: 30 | Fill #0

## 2020-04-18 MED FILL — ENTRESTO 24 MG-26 MG TABLET: 24-26 | 30 days supply | Qty: 60 | Fill #0

## 2020-04-18 MED FILL — ALLOPURINOL 100 MG TABLET: 100 | 30 days supply | Qty: 60 | Fill #0

## 2020-04-18 NOTE — Patient Instructions (Signed)
Visit Information  Chris [name] was given information about Medicaid Managed Care team care coordination services as a part of their Healthy [facility] Medicaid benefit. Chris [name] verbally consented to engagement with the [facility] Managed Care team.   For questions related to your Healthy [facility] health plan, please call: [phone] or visit the homepage here: [email or web address]  If you would like to schedule transportation through your Healthy [facility] plan, please call the following number at least 2 days in advance of your appointment: [phone]    Social Worker will follow up with patient in 7 days.   [name], [name], [place]  High Risk Managed Medicaid Team

## 2020-04-18 NOTE — Progress Notes (Signed)
Patient arrived at clinic to get covid test done.  Patient was tested and informed that he will be called with results

## 2020-04-18 NOTE — Patient Outreach (Signed)
Care Coordination  04/18/2020  Chris [name] 1960-10-02 [id number]  Chris [name] is a 59 y.o. year old male who sees [name], MD for primary care. The  Medicaid Managed Care team was consulted for assistance with Limited access to food Housing barriers. Mr. Advani was given information about Care Management services, agreed to services, and verbal consent for services was obtained.  Interventions:   Patient interviewed and appropriate assessments performed  Collaborated with clinical team regarding patient needs   SDOH (Social Determinants of Health) assessments performed: Yes     Provided patient with information about [address] [phone], [employer] [phone], and [place].  Plan:   Over the next 30 days, patient will work with [name] to address needs related to Limited access to food  Housing barriers  Patient will contact local food banks and began the process of looking for 1 bedroom affordable apartments..  Social Worker will assist patient in looking affordable housing.[name], [name], [place]  High Risk Managed Medicaid Team
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
## 2020-04-20 LAB — SARS-COV-2, NAA 2 DAY TAT

## 2020-04-20 LAB — NOVEL CORONAVIRUS, NAA: SARS-CoV-2, NAA: NOT DETECTED

## 2020-04-21 ENCOUNTER — Telehealth: Payer: Self-pay | Admitting: Internal Medicine

## 2020-04-21 ENCOUNTER — Other Ambulatory Visit: Payer: Self-pay | Admitting: *Deleted

## 2020-04-21 ENCOUNTER — Telehealth: Payer: Self-pay

## 2020-04-21 NOTE — Telephone Encounter (Signed)
Negative COVID results given. Patient results "NOT Detected." Caller expressed understanding. ° °

## 2020-04-21 NOTE — Patient Instructions (Signed)
Visit Information  Mr. LEDGER HEINDL  - as a part of your Medicaid benefit, you are eligible for care management and care coordination services at no cost or copay. I was unable to reach you by phone today but would be happy to help you with your health related needs. Please feel free to call me @ [phone].   A member of the Managed Medicaid care management team will reach out to you again over the next 7 days.   [name] RN, BSN [place]  [employer] manager

## 2020-04-21 NOTE — Telephone Encounter (Signed)
Patient name and DOB has been verified Patient was informed of lab results. Patient had no questions.  

## 2020-04-21 NOTE — Patient Outreach (Signed)
Care Coordination  04/21/2020  Chris [name] 03-22-1961 [id number]   An unsuccessful telephone outreach was attempted today. The patient was referred to the case management team for assistance with care management and care coordination.   Follow Up Plan: A HIPAA compliant phone message was left for the patient providing contact information and requesting a return call.  The Managed Medicaid care management team will reach out to the patient again over the next 7 days.   [name] RN, BSN [place]  [employer] manager

## 2020-04-21 NOTE — Telephone Encounter (Signed)
-----   Message from [name], MD sent at 04/20/2020  9:45 AM EST ----- Let pt know that his COVID test is negative.

## 2020-04-22 ENCOUNTER — Other Ambulatory Visit: Payer: Self-pay

## 2020-04-28 ENCOUNTER — Other Ambulatory Visit: Payer: Self-pay | Admitting: *Deleted

## 2020-04-28 NOTE — Patient Outreach (Signed)
Care Coordination  04/28/2020  Chris [name] 05-10-1961 [id number]  A second unsuccessful telephone outreach was attempted today. The patient was referred to the case management team for assistance with care management and care coordination.   Follow Up Plan: The Managed Medicaid care management team will reach out to the patient again over the next 7-14 days.   [name] RN, BSN [place]  [employer] manager

## 2020-04-28 NOTE — Patient Instructions (Signed)
Visit Information  [name]  - as a part of your Medicaid benefit, you are eligible for care management and care coordination services at no cost or copay. I was unable to reach you by phone today but would be happy to help you with your health related needs. Please feel free to call me @ [phone].   A member of the Managed Medicaid care management team will reach out to you again over the next 7-14 days.   Evelynn Hench RN, BSN [place]  [facility] [facility] RN Care Coordinator   

## 2020-04-29 ENCOUNTER — Other Ambulatory Visit: Payer: Self-pay

## 2020-04-29 NOTE — Patient Instructions (Signed)
Visit Information  Mr. Berwick was given information about Medicaid Managed Care team care coordination services as a part of their Healthy [facility] Medicaid benefit. BRAYAN VOTAW verbally consented to engagement with the [facility] Managed Care team.   For questions related to your Healthy [facility] health plan, please call: [phone] or visit the homepage here: [email or web address]  If you would like to schedule transportation through your Healthy [facility] plan, please call the following number at least 2 days in advance of your appointment: [phone]    Patient will continue to look for housing.. Social Worker will continue to assist patient with housing and follow up in 14 days.[name], [name], [place]  High Risk Managed Medicaid Team

## 2020-04-29 NOTE — Patient Outreach (Signed)
Care Coordination- Social Work  04/29/2020  Chris [name] 1961/03/07 [id number]  Subjective:    Chris [name] is an 59 y.o. year old male who is a primary patient of Chris Pier, MD.    Chris [name] was given information about Medicaid Managed Care team care coordination services today. [name] agreed to services and verbal consent obtained  Review of patient status, laboratory and other test data was performed as part of evaluation for provision of services.  SDOH:   SDOH Screenings   Alcohol Screen:   . Last Alcohol Screening Score (AUDIT): Not on file  Depression (PHQ2-9): Medium Risk  . PHQ-2 Score: 14  Financial Resource Strain:   . Difficulty of Paying Living Expenses: Not on file  Food Insecurity: Food Insecurity Present  . Worried About Charity fundraiser in the Last Year: Often true  . Ran Out of Food in the Last Year: Often true  Housing:   . Last Housing Risk Score: Not on file  Physical Activity:   . Days of Exercise per Week: Not on file  . Minutes of Exercise per Session: Not on file  Social Connections:   . Frequency of Communication with Friends and Family: Not on file  . Frequency of Social Gatherings with Friends and Family: Not on file  . Attends Religious Services: Not on file  . Active Member of Clubs or Organizations: Not on file  . Attends Archivist Meetings: Not on file  . Marital Status: Not on file  Stress:   . Feeling of Stress : Not on file  Tobacco Use: High Risk  . Smoking Tobacco Use: Current Every Day Smoker  . Smokeless Tobacco Use: Never Used  Transportation Needs: Unmet Transportation Needs  . Lack of Transportation (Medical): Yes  . Lack of Transportation (Non-Medical): Yes     Objective:    Medications:  Medications Reviewed Today    Reviewed by [name], RN (Registered Nurse) on 04/18/20 at 40  Med List Status: <None>  Medication Order Taking? Sig Documenting Provider Last Dose Status  Informant  albuterol (PROVENTIL) (2.5 MG/3ML) 0.083% nebulizer solution [id number] No Take 3 mLs by nebulization every 6 (six) hours as needed for shortness of breath.  Patient not taking: Reported on 04/18/2020   Chris Pier, MD Not Taking Active   albuterol (VENTOLIN HFA) 108 (90 Base) MCG/ACT inhaler [id number] No Inhale 2 puffs into the lungs every 6 (six) hours as needed for wheezing or shortness of breath.  Patient not taking: Reported on 04/18/2020   Chris Pier, MD Not Taking Active   allopurinol (ZYLOPRIM) 100 MG tablet [id number] No Take 2 tablets (200 mg total) by mouth daily.  Patient not taking: Reported on 04/18/2020   Chris Pier, MD Not Taking Active   aspirin 81 MG EC tablet [id number] No Take 1 tablet (81 mg total) by mouth daily.  Patient not taking: Reported on 04/18/2020   Chris Pier, MD Not Taking Active   atorvastatin (LIPITOR) 40 MG tablet [id number] No Take 1 tablet (40 mg total) by mouth daily at 6 PM.  Patient not taking: Reported on 04/18/2020   Chris Pier, MD Not Taking Active   blood glucose meter kit and supplies [id number] No Dispense based on patient and insurance preference. Use up to four times daily as directed. (FOR ICD-10 E10.9, E11.9).  Patient not taking: Reported on 04/18/2020   Chris Pier, MD Not Taking Active  Blood Glucose Monitoring Suppl (ACCU-CHEK GUIDE) w/Device KIT [id number] No USE AS DIRECTED  Patient not taking: Reported on 04/18/2020   Chris Pier, MD Not Taking Active   Blood Glucose Monitoring Suppl (TRUE METRIX METER) w/Device KIT [id number] No Use as directed  Patient not taking: Reported on 04/18/2020   Chris Pier, MD Not Taking Active   budesonide-formoterol [facility]) 160-4.5 MCG/ACT inhaler [id number] No Inhale 2 puffs into the lungs 2 (two) times daily.  Patient not taking: Reported on 04/18/2020   Chris Pier, MD Not Taking Active   carvedilol (COREG) 25 MG tablet [id number] No  Take 1 tablet (25 mg total) by mouth 2 (two) times daily with a meal.  Patient not taking: Reported on 04/18/2020   Chris Pier, MD Not Taking Active   colchicine 0.6 MG tablet [id number] No Take half tablet by mouth every other day.  Patient not taking: Reported on 04/18/2020   Chris Pier, MD Not Taking Active   fluticasone [facility]) 50 MCG/ACT nasal spray [id number] No Place 1 spray into both nostrils daily as needed for allergies or rhinitis.  Patient not taking: Reported on 04/18/2020   Chris Pier, MD Not Taking Active   furosemide (LASIX) 40 MG tablet [id number] No Take 1.5 tablets (60 mg total) by mouth daily.  Patient not taking: Reported on 04/18/2020   Chris Pier, MD Not Taking Active   glipiZIDE (GLUCOTROL) 5 MG tablet [id number] No Take 0.5 tablets (2.5 mg total) by mouth 2 (two) times daily before a meal.  Patient not taking: Reported on 04/18/2020   Chris Pier, MD Not Taking Active   glucose blood (ACCU-CHEK GUIDE) test strip [id number] No Use as instructed. Check blood glucose by fingerstick once per day.  Patient not taking: Reported on 04/18/2020   Chris Pier, MD Not Taking Active   glucose blood (TRUE METRIX BLOOD GLUCOSE TEST) test strip [id number] No Use as instructed  Patient not taking: Reported on 04/18/2020   Chris Pier, MD Not Taking Active   isosorbide-hydrALAZINE (BIDIL) 20-37.5 MG tablet [id number] No Take 1 tablet by mouth 3 (three) times daily.  Patient not taking: Reported on 04/18/2020   Chris Pier, MD Not Taking Active   Lancet Devices (LANCING DEVICE) MISC [id number] No 1 Device by Does not apply route daily.  Patient not taking: Reported on 04/18/2020   Niel Hummer A, MD Not Taking Active   montelukast (SINGULAIR) 10 MG tablet [id number] No Take 1 tablet (10 mg total) by mouth at bedtime.  Patient not taking: Reported on 04/18/2020   Chris Pier, MD Not Taking Active   sacubitril-valsartan  [facility]) 24-26 MG [id number] No Take 1 tablet by mouth 2 (two) times daily.  Patient not taking: Reported on 04/18/2020   Chris Pier, MD Not Taking Active   TRUEplus Lancets 28G MISC [id number] No Use as directed  Patient not taking: Reported on 04/18/2020   Chris Pier, MD Not Taking Active           Fall/Depression Screening:  Fall Risk  04/17/2020 09/11/2019 04/23/2014  Falls in the past year? 0 0 No  Number falls in past yr: - 0 -   PHQ 2/9 Scores 11/01/2019 09/11/2019 07/09/2019 04/04/2019 11/30/2018 07/05/2017 05/13/2017  PHQ - 2 Score _0 0 3 5  PHQ- 9 Score _1 - 9 13    Assessment:  Plan: [name] will continue to assist patient with somewhere  to move.

## 2020-04-30 ENCOUNTER — Telehealth: Payer: Self-pay | Admitting: Internal Medicine

## 2020-04-30 NOTE — Telephone Encounter (Signed)
Attempted to reach Chris [name] to schedule him an initial visit with the Managed medicaid Pharmacist. I left a message with my name and my number. Will make another attempt if I don't hear back from Mr.Kratzke.

## 2020-05-02 ENCOUNTER — Other Ambulatory Visit: Payer: Medicaid Other

## 2020-05-02 DIAGNOSIS — Z20822 Contact with and (suspected) exposure to covid-19: Secondary | ICD-10-CM | POA: Diagnosis not present

## 2020-05-03 LAB — SARS-COV-2, NAA 2 DAY TAT

## 2020-05-03 LAB — NOVEL CORONAVIRUS, NAA: SARS-CoV-2, NAA: NOT DETECTED

## 2020-05-04 ENCOUNTER — Emergency Department (HOSPITAL_COMMUNITY): Payer: Medicaid Other

## 2020-05-04 ENCOUNTER — Other Ambulatory Visit: Payer: Self-pay

## 2020-05-04 ENCOUNTER — Encounter (HOSPITAL_COMMUNITY): Payer: Self-pay | Admitting: *Deleted

## 2020-05-04 ENCOUNTER — Observation Stay (HOSPITAL_COMMUNITY)
Admission: EM | Admit: 2020-05-04 | Discharge: 2020-05-05 | Disposition: A | Discharge status: Home / Self Care | Payer: Medicaid Other | Attending: Internal Medicine | Admitting: Internal Medicine

## 2020-05-04 DIAGNOSIS — Z79899 Other long term (current) drug therapy: Secondary | ICD-10-CM | POA: Insufficient documentation

## 2020-05-04 DIAGNOSIS — Z7984 Long term (current) use of oral hypoglycemic drugs: Secondary | ICD-10-CM | POA: Insufficient documentation

## 2020-05-04 DIAGNOSIS — J811 Chronic pulmonary edema: Secondary | ICD-10-CM | POA: Diagnosis not present

## 2020-05-04 DIAGNOSIS — I5043 Acute on chronic combined systolic (congestive) and diastolic (congestive) heart failure: Secondary | ICD-10-CM | POA: Diagnosis present

## 2020-05-04 DIAGNOSIS — J449 Chronic obstructive pulmonary disease, unspecified: Secondary | ICD-10-CM | POA: Diagnosis not present

## 2020-05-04 DIAGNOSIS — E1169 Type 2 diabetes mellitus with other specified complication: Secondary | ICD-10-CM

## 2020-05-04 DIAGNOSIS — R079 Chest pain, unspecified: Secondary | ICD-10-CM | POA: Insufficient documentation

## 2020-05-04 DIAGNOSIS — F17210 Nicotine dependence, cigarettes, uncomplicated: Secondary | ICD-10-CM | POA: Diagnosis not present

## 2020-05-04 DIAGNOSIS — I517 Cardiomegaly: Secondary | ICD-10-CM | POA: Diagnosis not present

## 2020-05-04 DIAGNOSIS — R778 Other specified abnormalities of plasma proteins: Secondary | ICD-10-CM

## 2020-05-04 DIAGNOSIS — F1490 Cocaine use, unspecified, uncomplicated: Secondary | ICD-10-CM

## 2020-05-04 DIAGNOSIS — J90 Pleural effusion, not elsewhere classified: Secondary | ICD-10-CM | POA: Diagnosis not present

## 2020-05-04 DIAGNOSIS — G9340 Encephalopathy, unspecified: Secondary | ICD-10-CM | POA: Diagnosis not present

## 2020-05-04 DIAGNOSIS — F1290 Cannabis use, unspecified, uncomplicated: Secondary | ICD-10-CM | POA: Diagnosis not present

## 2020-05-04 DIAGNOSIS — E785 Hyperlipidemia, unspecified: Secondary | ICD-10-CM | POA: Diagnosis not present

## 2020-05-04 DIAGNOSIS — I130 Hypertensive heart and chronic kidney disease with heart failure and stage 1 through stage 4 chronic kidney disease, or unspecified chronic kidney disease: Secondary | ICD-10-CM | POA: Insufficient documentation

## 2020-05-04 DIAGNOSIS — N183 Chronic kidney disease, stage 3 unspecified: Secondary | ICD-10-CM | POA: Insufficient documentation

## 2020-05-04 DIAGNOSIS — R7989 Other specified abnormal findings of blood chemistry: Secondary | ICD-10-CM

## 2020-05-04 DIAGNOSIS — F1410 Cocaine abuse, uncomplicated: Secondary | ICD-10-CM | POA: Diagnosis present

## 2020-05-04 DIAGNOSIS — R1013 Epigastric pain: Secondary | ICD-10-CM | POA: Insufficient documentation

## 2020-05-04 DIAGNOSIS — J810 Acute pulmonary edema: Secondary | ICD-10-CM

## 2020-05-04 DIAGNOSIS — M25521 Pain in right elbow: Secondary | ICD-10-CM | POA: Diagnosis not present

## 2020-05-04 DIAGNOSIS — E1122 Type 2 diabetes mellitus with diabetic chronic kidney disease: Secondary | ICD-10-CM | POA: Diagnosis not present

## 2020-05-04 DIAGNOSIS — I5041 Acute combined systolic (congestive) and diastolic (congestive) heart failure: Secondary | ICD-10-CM | POA: Diagnosis not present

## 2020-05-04 DIAGNOSIS — Z7982 Long term (current) use of aspirin: Secondary | ICD-10-CM | POA: Insufficient documentation

## 2020-05-04 DIAGNOSIS — I2510 Atherosclerotic heart disease of native coronary artery without angina pectoris: Secondary | ICD-10-CM | POA: Insufficient documentation

## 2020-05-04 DIAGNOSIS — E782 Mixed hyperlipidemia: Secondary | ICD-10-CM | POA: Diagnosis present

## 2020-05-04 DIAGNOSIS — I10 Essential (primary) hypertension: Secondary | ICD-10-CM

## 2020-05-04 DIAGNOSIS — Z20822 Contact with and (suspected) exposure to covid-19: Secondary | ICD-10-CM | POA: Insufficient documentation

## 2020-05-04 DIAGNOSIS — I5042 Chronic combined systolic (congestive) and diastolic (congestive) heart failure: Principal | ICD-10-CM

## 2020-05-04 DIAGNOSIS — R0789 Other chest pain: Secondary | ICD-10-CM | POA: Diagnosis not present

## 2020-05-04 DIAGNOSIS — Z550 Illiteracy and low-level literacy: Secondary | ICD-10-CM | POA: Insufficient documentation

## 2020-05-04 DIAGNOSIS — R072 Precordial pain: Secondary | ICD-10-CM | POA: Diagnosis present

## 2020-05-04 DIAGNOSIS — N289 Disorder of kidney and ureter, unspecified: Secondary | ICD-10-CM | POA: Diagnosis not present

## 2020-05-04 DIAGNOSIS — F17200 Nicotine dependence, unspecified, uncomplicated: Secondary | ICD-10-CM | POA: Diagnosis present

## 2020-05-04 DIAGNOSIS — R0602 Shortness of breath: Secondary | ICD-10-CM | POA: Diagnosis not present

## 2020-05-04 DIAGNOSIS — I509 Heart failure, unspecified: Secondary | ICD-10-CM

## 2020-05-04 DIAGNOSIS — F1210 Cannabis abuse, uncomplicated: Secondary | ICD-10-CM | POA: Diagnosis present

## 2020-05-04 LAB — CBC WITH DIFFERENTIAL/PLATELET
Abs Immature Granulocytes: 0.06 10*3/uL (ref 0.00–0.07)
Basophils Absolute: 0.1 10*3/uL (ref 0.0–0.1)
Basophils Relative: 1 %
Eosinophils Absolute: 0.3 10*3/uL (ref 0.0–0.5)
Eosinophils Relative: 3 %
HCT: 45.1 % (ref 39.0–52.0)
Hemoglobin: 14.4 g/dL (ref 13.0–17.0)
Immature Granulocytes: 1 %
Lymphocytes Relative: 29 %
Lymphs Abs: 2.9 10*3/uL (ref 0.7–4.0)
MCH: 31 pg (ref 26.0–34.0)
MCHC: 31.9 g/dL (ref 30.0–36.0)
MCV: 97 fL (ref 80.0–100.0)
Monocytes Absolute: 0.9 10*3/uL (ref 0.1–1.0)
Monocytes Relative: 9 %
Neutro Abs: 5.9 10*3/uL (ref 1.7–7.7)
Neutrophils Relative %: 57 %
Platelets: 246 10*3/uL (ref 150–400)
RBC: 4.65 MIL/uL (ref 4.22–5.81)
RDW: 15.9 % — ABNORMAL HIGH (ref 11.5–15.5)
WBC: 10.1 10*3/uL (ref 4.0–10.5)
nRBC: 0 % (ref 0.0–0.2)

## 2020-05-04 LAB — BLOOD GAS, VENOUS
Acid-base deficit: 0.1 mmol/L (ref 0.0–2.0)
Bicarbonate: 25.3 mmol/L (ref 20.0–28.0)
O2 Saturation: 86.9 %
Patient temperature: 37
pCO2, Ven: 51.1 mmHg (ref 44.0–60.0)
pH, Ven: 7.316 (ref 7.250–7.430)

## 2020-05-04 LAB — I-STAT CHEM 8, ED
BUN: 31 mg/dL — ABNORMAL HIGH (ref 6–20)
Calcium, Ion: 1.16 mmol/L (ref 1.15–1.40)
Chloride: 108 mmol/L (ref 98–111)
Creatinine, Ser: 1.9 mg/dL — ABNORMAL HIGH (ref 0.61–1.24)
Glucose, Bld: 94 mg/dL (ref 70–99)
HCT: 41 % (ref 39.0–52.0)
Hemoglobin: 13.9 g/dL (ref 13.0–17.0)
Potassium: 4.6 mmol/L (ref 3.5–5.1)
Sodium: 140 mmol/L (ref 135–145)
TCO2: 24 mmol/L (ref 22–32)

## 2020-05-04 LAB — RAPID URINE DRUG SCREEN, HOSP PERFORMED
Amphetamines: NOT DETECTED
Barbiturates: NOT DETECTED
Benzodiazepines: NOT DETECTED
Cocaine: POSITIVE — AB
Opiates: NOT DETECTED
Tetrahydrocannabinol: POSITIVE — AB

## 2020-05-04 LAB — RESPIRATORY PANEL BY RT PCR (FLU A&B, COVID)
Influenza A by PCR: NEGATIVE
Influenza B by PCR: NEGATIVE
SARS Coronavirus 2 by RT PCR: NEGATIVE

## 2020-05-04 LAB — TROPONIN I (HIGH SENSITIVITY)
Troponin I (High Sensitivity): 91 ng/L — ABNORMAL HIGH (ref <18)
Troponin I (High Sensitivity): 76 ng/L — ABNORMAL HIGH (ref <18)

## 2020-05-04 LAB — HEMOGLOBIN A1C
Hgb A1c MFr Bld: 6.2 % — ABNORMAL HIGH (ref 4.8–5.6)
Mean Plasma Glucose: 131.24 mg/dL

## 2020-05-04 LAB — GLUCOSE, CAPILLARY
Glucose-Capillary: 116 mg/dL — ABNORMAL HIGH (ref 70–99)
Glucose-Capillary: 109 mg/dL — ABNORMAL HIGH (ref 70–99)

## 2020-05-04 LAB — BRAIN NATRIURETIC PEPTIDE: B Natriuretic Peptide: 2316.2 pg/mL — ABNORMAL HIGH (ref 0.0–100.0)

## 2020-05-04 LAB — CREATININE, SERUM
Creatinine, Ser: 1.88 mg/dL — ABNORMAL HIGH (ref 0.61–1.24)
GFR, Estimated: 41 mL/min — ABNORMAL LOW (ref >60)

## 2020-05-04 LAB — MAGNESIUM: Magnesium: 1.7 mg/dL (ref 1.7–2.4)

## 2020-05-04 MED ORDER — SODIUM CHLORIDE 0.9% FLUSH
3.0000 mL | Freq: Two times a day (BID) | INTRAVENOUS | Status: DC
  Administered 2020-05-04 – 2020-05-05 (×2): 3 mL via INTRAVENOUS

## 2020-05-04 MED ORDER — FUROSEMIDE 10 MG/ML IJ SOLN
40.0000 mg | Freq: Once | INTRAMUSCULAR | Status: AC
  Administered 2020-05-04: 40 mg via INTRAVENOUS
  Filled 2020-05-04: qty 4

## 2020-05-04 MED ORDER — ACETAMINOPHEN 325 MG PO TABS: 650.0000 mg | ORAL_TABLET | ORAL | Status: DC | PRN

## 2020-05-04 MED ORDER — ATORVASTATIN CALCIUM 40 MG PO TABS
40.0000 mg | ORAL_TABLET | Freq: Every day | ORAL | Status: DC
  Administered 2020-05-04: 40 mg via ORAL
  Filled 2020-05-04: qty 1

## 2020-05-04 MED ORDER — ALBUTEROL SULFATE HFA 108 (90 BASE) MCG/ACT IN AERS
2.0000 | INHALATION_SPRAY | Freq: Four times a day (QID) | RESPIRATORY_TRACT | Status: DC | PRN
  Filled 2020-05-04: qty 6.7

## 2020-05-04 MED ORDER — ASPIRIN 81 MG PO CHEW
324.0000 mg | CHEWABLE_TABLET | Freq: Once | ORAL | Status: AC
  Administered 2020-05-04: 324 mg via ORAL
  Filled 2020-05-04: qty 4

## 2020-05-04 MED ORDER — FUROSEMIDE 10 MG/ML IJ SOLN
60.0000 mg | Freq: Two times a day (BID) | INTRAMUSCULAR | Status: DC
  Administered 2020-05-04 – 2020-05-05 (×2): 60 mg via INTRAVENOUS
  Filled 2020-05-04 (×2): qty 6

## 2020-05-04 MED ORDER — ENOXAPARIN SODIUM 60 MG/0.6ML ~~LOC~~ SOLN
60.0000 mg | SUBCUTANEOUS | Status: DC
  Administered 2020-05-04 – 2020-05-05 (×2): 60 mg via SUBCUTANEOUS
  Filled 2020-05-04 (×3): qty 0.6

## 2020-05-04 MED ORDER — MOMETASONE FURO-FORMOTEROL FUM 200-5 MCG/ACT IN AERO
2.0000 | INHALATION_SPRAY | Freq: Two times a day (BID) | RESPIRATORY_TRACT | Status: DC
  Administered 2020-05-04 – 2020-05-05 (×2): 2 via RESPIRATORY_TRACT
  Filled 2020-05-04: qty 8.8

## 2020-05-04 MED ORDER — INSULIN ASPART 100 UNIT/ML ~~LOC~~ SOLN: 0.0000 [IU] | Freq: Three times a day (TID) | SUBCUTANEOUS | Status: DC

## 2020-05-04 MED ORDER — ACETAMINOPHEN 325 MG PO TABS
650.0000 mg | ORAL_TABLET | Freq: Once | ORAL | Status: AC
  Administered 2020-05-04: 650 mg via ORAL
  Filled 2020-05-04: qty 2

## 2020-05-04 MED ORDER — MONTELUKAST SODIUM 10 MG PO TABS
10.0000 mg | ORAL_TABLET | Freq: Every day | ORAL | Status: DC
  Administered 2020-05-04: 10 mg via ORAL
  Filled 2020-05-04: qty 1

## 2020-05-04 MED ORDER — SODIUM CHLORIDE 0.9% FLUSH: 3.0000 mL | INTRAVENOUS | Status: DC | PRN

## 2020-05-04 MED ORDER — SACUBITRIL-VALSARTAN 24-26 MG PO TABS
1.0000 | ORAL_TABLET | Freq: Two times a day (BID) | ORAL | Status: DC
  Administered 2020-05-04 – 2020-05-05 (×3): 1 via ORAL
  Filled 2020-05-04 (×4): qty 1

## 2020-05-04 MED ORDER — FUROSEMIDE 10 MG/ML IJ SOLN
60.0000 mg | Freq: Two times a day (BID) | INTRAMUSCULAR | Status: DC
  Filled 2020-05-04: qty 6

## 2020-05-04 MED ORDER — ASPIRIN EC 81 MG PO TBEC
81.0000 mg | DELAYED_RELEASE_TABLET | Freq: Every day | ORAL | Status: DC
  Administered 2020-05-04 – 2020-05-05 (×2): 81 mg via ORAL
  Filled 2020-05-04 (×2): qty 1

## 2020-05-04 MED ORDER — ONDANSETRON HCL 4 MG/2ML IJ SOLN: 4.0000 mg | Freq: Four times a day (QID) | INTRAMUSCULAR | Status: DC | PRN

## 2020-05-04 MED ORDER — NITROGLYCERIN 2 % TD OINT
1.0000 [in_us] | TOPICAL_OINTMENT | Freq: Once | TRANSDERMAL | Status: AC
  Administered 2020-05-04: 1 [in_us] via TOPICAL
  Filled 2020-05-04: qty 1

## 2020-05-04 MED ORDER — SODIUM CHLORIDE 0.9 % IV SOLN
250.0000 mL | INTRAVENOUS | Status: DC | PRN
  Administered 2020-05-05: 250 mL via INTRAVENOUS

## 2020-05-04 NOTE — ED Triage Notes (Signed)
The pt is c/o sob and abd pain tonight and 2 weeks ago  He did not see a doctor 2 weeks ago nausea vomiting he is supposed to be on home 02  Is not  C/o extreme pain everywhere

## 2020-05-04 NOTE — ED Notes (Signed)
Change of shift. First contact. Pt resting in bed. Asking for food and drink. Pt currently NPO per Emergency Physician. Will continue to monitor.

## 2020-05-04 NOTE — Progress Notes (Signed)
Patient has frequent mood swings with impulsive inapproiate language and behaviors all over the place with falling asleep while talking. [facility] staff also made notes about delirium .Demanding frequent drinks. Gave teaching on Heart failure health maintenance with no teach back or receptiveness.

## 2020-05-04 NOTE — ED Provider Notes (Signed)
[facility] EMERGENCY DEPARTMENT Provider Note   CSN: [id number] Arrival date & time: 05/04/20  9357     History Chief Complaint  Patient presents with  . Chest Pain  . Abdominal Pain    Chris [name] is a 59 y.o. male.  The history is provided by the patient.  Chest Pain Pain location:  Epigastric Pain quality: sharp   Pain quality comment:  Fullness Pain radiates to:  Precordial region Pain severity:  Moderate Onset quality:  Gradual Duration:  2 weeks Timing:  Constant Progression:  Worsening Chronicity:  Recurrent Context: breathing and at rest   Relieved by:  Nothing Worsened by:  Nothing Associated symptoms: cough and shortness of breath   Associated symptoms: no claudication, no fever, no nausea, no near-syncope, no PND, no syncope, no vomiting and no weakness   Risk factors: male sex   Abdominal Pain Associated symptoms: chest pain, cough and shortness of breath   Associated symptoms: no fever, no nausea and no vomiting   Patient with a h/o CHF and polysubstance abuse presents with chest pain and SOB for 2 weeks that is worsening.  He reports he is unsure if he is taking his medication, correctly or at all.  He states someone is supposed to be coming out to help him but is not.  No n/v/d.       Past Medical History:  Diagnosis Date  . CHF (congestive heart failure) ([place])   . Chronic kidney disease   . COPD (chronic obstructive pulmonary disease) ([place])   . Coronary artery disease   . Depression   . Diabetes mellitus without complication ([place])   . GERD (gastroesophageal reflux disease)   . Gout   . Hypertension   . Influenza A with respiratory manifestations   . Mental disorder     Patient Active Problem List   Diagnosis Date Noted  . Chronic respiratory failure with hypoxia, on home oxygen therapy ([place]) 12/28/2019  . Type 2 diabetes mellitus with stage 3 chronic kidney disease ([place]) 12/25/2019  . Acute and chronic respiratory  failure (acute-on-chronic) ([place]) 12/25/2019  . Acute on chronic combined systolic and diastolic CHF (congestive heart failure) ([place]) 10/26/2019  . Elevated troponin I level 10/26/2019  . Acute on chronic diastolic (congestive) heart failure ([place]) 10/26/2019  . History of gout 02/01/2019  . Seasonal allergic rhinitis due to pollen 02/01/2019  . Tobacco dependence 11/30/2018  . Microscopic hematuria 11/30/2018  . Depression 11/30/2018  . Difficulty controlling anger 11/30/2018  . COPD (chronic obstructive pulmonary disease) ([place])   . CKD (chronic kidney disease) stage 3, GFR 30-59 ml/min (HCC) 08/10/2018  . Recurrent epistaxis 04/21/2018  . Mixed hyperlipidemia 07/28/2017  . Essential hypertension 07/28/2017  . Chronic systolic heart failure ([place]) 10/25/2014  . Cocaine abuse ([place]) 02/20/2013  . Cannabis abuse 02/20/2013  . Back pain, chronic 02/20/2013    Past Surgical History:  Procedure Laterality Date  . ANKLE SURGERY    . CARDIAC CATHETERIZATION    . HERNIA REPAIR     x2  . SHOULDER SURGERY         Family History  Problem Relation Age of Onset  . Heart disease Father   . Diabetes Mother   . HIV Brother   . Healthy Son   . Healthy Daughter     Social History   Tobacco Use  . Smoking status: Current Every Day Smoker    Packs/day: 1.00    Years: 20.00    Pack  years: 20.00    Types: Cigarettes  . Smokeless tobacco: Never Used  . Tobacco comment: less than 1 pack per day  Vaping Use  . Vaping Use: Never used  Substance Use Topics  . Alcohol use: No  . Drug use: Yes    Frequency: 21.0 times per week    Types: Marijuana, Cocaine    Comment: no longer- Cocaine    Home Medications Prior to Admission medications   Medication Sig Start Date End Date Taking? Authorizing Provider  albuterol (PROVENTIL) (2.5 MG/3ML) 0.083% nebulizer solution Take 3 mLs by nebulization every 6 (six) hours as needed for shortness of breath. Patient not taking: Reported on 04/18/2020  04/17/20   [name], MD  albuterol (VENTOLIN HFA) 108 (90 Base) MCG/ACT inhaler Inhale 2 puffs into the lungs every 6 (six) hours as needed for wheezing or shortness of breath. Patient not taking: Reported on 04/18/2020 04/17/20   [name], MD  allopurinol (ZYLOPRIM) 100 MG tablet Take 2 tablets (200 mg total) by mouth daily. Patient not taking: Reported on 04/18/2020 04/17/20   [name], MD  aspirin 81 MG EC tablet Take 1 tablet (81 mg total) by mouth daily. Patient not taking: Reported on 04/18/2020 03/06/20   [name], MD  atorvastatin (LIPITOR) 40 MG tablet Take 1 tablet (40 mg total) by mouth daily at 6 PM. Patient not taking: Reported on 04/18/2020 04/17/20   [name], MD  blood glucose meter kit and supplies Dispense based on patient and insurance preference. Use up to four times daily as directed. (FOR ICD-10 E10.9, E11.9). Patient not taking: Reported on 04/18/2020 04/17/20   [name], MD  Blood Glucose Monitoring Suppl (ACCU-CHEK GUIDE) w/Device KIT USE AS DIRECTED Patient not taking: Reported on 04/18/2020 04/17/20   [name], MD  Blood Glucose Monitoring Suppl (TRUE METRIX METER) w/Device KIT Use as directed Patient not taking: Reported on 04/18/2020 04/17/20   [name], MD  budesonide-formoterol [facility]) 160-4.5 MCG/ACT inhaler Inhale 2 puffs into the lungs 2 (two) times daily. Patient not taking: Reported on 04/18/2020 04/17/20   [name], MD  carvedilol (COREG) 25 MG tablet Take 1 tablet (25 mg total) by mouth 2 (two) times daily with a meal. Patient not taking: Reported on 04/18/2020 04/17/20   [name], MD  colchicine 0.6 MG tablet Take half tablet by mouth every other day. Patient not taking: Reported on 04/18/2020 03/06/20   [name], MD  fluticasone [facility]) 50 MCG/ACT nasal spray Place 1 spray into both nostrils daily as needed for allergies or rhinitis. Patient not taking: Reported  on 04/18/2020 03/06/20   [name], MD  furosemide (LASIX) 40 MG tablet Take 1.5 tablets (60 mg total) by mouth daily. Patient not taking: Reported on 04/18/2020 04/17/20   [name], MD  glipiZIDE (GLUCOTROL) 5 MG tablet Take 0.5 tablets (2.5 mg total) by mouth 2 (two) times daily before a meal. Patient not taking: Reported on 04/18/2020 04/17/20   [name], MD  glucose blood (ACCU-CHEK GUIDE) test strip Use as instructed. Check blood glucose by fingerstick once per day. Patient not taking: Reported on 04/18/2020 03/06/20   [name], MD  glucose blood (TRUE METRIX BLOOD GLUCOSE TEST) test strip Use as instructed Patient not taking: Reported on 04/18/2020 12/28/19   [name], MD  isosorbide-hydrALAZINE (BIDIL) 20-37.5 MG tablet Take 1 tablet by mouth 3 (three) times daily. Patient not  taking: Reported on 04/18/2020 04/17/20   [name], MD  Lancet Devices (LANCING DEVICE) MISC 1 Device by Does not apply route daily. Patient not taking: Reported on 04/18/2020 12/27/19   Regalado, Jerald Kief A, MD  montelukast (SINGULAIR) 10 MG tablet Take 1 tablet (10 mg total) by mouth at bedtime. Patient not taking: Reported on 04/18/2020 04/17/20 07/16/20  [name], MD  sacubitril-valsartan (ENTRESTO) 24-26 MG Take 1 tablet by mouth 2 (two) times daily. Patient not taking: Reported on 04/18/2020 04/17/20   [name], MD  TRUEplus Lancets 28G MISC Use as directed Patient not taking: Reported on 04/18/2020 12/28/19   [name], MD    Allergies    Patient has no known allergies.  Review of Systems   Review of Systems  Constitutional: Negative for fever.  HENT: Negative for congestion.   Eyes: Negative for visual disturbance.  Respiratory: Positive for cough and shortness of breath.   Cardiovascular: Positive for chest pain. Negative for claudication, syncope, PND and near-syncope.  Gastrointestinal: Negative for nausea and vomiting.        Bloating   Genitourinary: Negative for difficulty urinating.  Musculoskeletal: Negative for neck pain.  Neurological: Negative for weakness.  Psychiatric/Behavioral: Negative for agitation.  All other systems reviewed and are negative.   Physical Exam Updated Vital Signs BP (!) 160/107   Pulse 96   Temp (!) 97.5 F (36.4 C) (Oral)   Resp (!) 38   Ht 6' (1.829 m)   Wt 123.1 kg   SpO2 98%   BMI 36.81 kg/m   Physical Exam Vitals and nursing note reviewed.  Constitutional:      Appearance: Normal appearance. He is not diaphoretic.  HENT:     Head: Normocephalic and atraumatic.     Nose: Nose normal.  Eyes:     Conjunctiva/sclera: Conjunctivae normal.     Pupils: Pupils are equal, round, and reactive to light.  Cardiovascular:     Rate and Rhythm: Normal rate and regular rhythm.     Pulses: Normal pulses.     Heart sounds: Normal heart sounds.  Pulmonary:     Effort: Tachypnea present.     Breath sounds: Rales present.  Abdominal:     General: Bowel sounds are normal.     Tenderness: There is no abdominal tenderness. There is no guarding or rebound.  Musculoskeletal:     Cervical back: Normal range of motion and neck supple.     Right lower leg: No edema.     Left lower leg: No edema.  Skin:    General: Skin is warm and dry.     Capillary Refill: Capillary refill takes less than 2 seconds.  Neurological:     General: No focal deficit present.     Mental Status: He is alert.     Deep Tendon Reflexes: Reflexes normal.     ED Results / Procedures / Treatments   Labs (all labs ordered are listed, but only abnormal results are displayed) Results for orders placed or performed during the hospital encounter of 05/04/20  Respiratory Panel by RT PCR (Flu A&B, Covid) - Nasopharyngeal Swab   Specimen: Nasopharyngeal Swab; Nasopharyngeal(NP) swabs in vial transport medium  Result Value Ref Range   SARS Coronavirus 2 by RT PCR NEGATIVE NEGATIVE   Influenza A by PCR  NEGATIVE NEGATIVE   Influenza B by PCR NEGATIVE NEGATIVE  CBC with Differential/Platelet  Result Value Ref Range   WBC 10.1 4.0 - 10.5 K/uL  RBC 4.65 4.22 - 5.81 MIL/uL   Hemoglobin 14.4 13.0 - 17.0 g/dL   HCT 45.1 39 - 52 %   MCV 97.0 80.0 - 100.0 fL   MCH 31.0 26.0 - 34.0 pg   MCHC 31.9 30.0 - 36.0 g/dL   RDW 15.9 (H) 11.5 - 15.5 %   Platelets 246 150 - 400 K/uL   nRBC 0.0 0.0 - 0.2 %   Neutrophils Relative % 57 %   Neutro Abs 5.9 1.7 - 7.7 K/uL   Lymphocytes Relative 29 %   Lymphs Abs 2.9 0.7 - 4.0 K/uL   Monocytes Relative 9 %   Monocytes Absolute 0.9 0.1 - 1.0 K/uL   Eosinophils Relative 3 %   Eosinophils Absolute 0.3 0.0 - 0.5 K/uL   Basophils Relative 1 %   Basophils Absolute 0.1 0.0 - 0.1 K/uL   Immature Granulocytes 1 %   Abs Immature Granulocytes 0.06 0.00 - 0.07 K/uL  Brain natriuretic peptide  Result Value Ref Range   B Natriuretic Peptide 2,316.2 (H) 0.0 - 100.0 pg/mL  I-stat chem 8, ED (not at [facility] or [facility])  Result Value Ref Range   Sodium 140 135 - 145 mmol/L   Potassium 4.6 3.5 - 5.1 mmol/L   Chloride 108 98 - 111 mmol/L   BUN 31 (H) 6 - 20 mg/dL   Creatinine, Ser 1.90 (H) 0.61 - 1.24 mg/dL   Glucose, Bld 94 70 - 99 mg/dL   Calcium, Ion 1.16 1.15 - 1.40 mmol/L   TCO2 24 22 - 32 mmol/L   Hemoglobin 13.9 13.0 - 17.0 g/dL   HCT 41.0 39 - 52 %  Troponin I (High Sensitivity)  Result Value Ref Range   Troponin I (High Sensitivity) 91 (H) <18 ng/L   DG Chest Portable 1 View  Result Date: 05/04/2020 CLINICAL DATA:  Acute shortness of breath and pain today. EXAM: PORTABLE CHEST 1 VIEW COMPARISON:  12/24/2019 and prior radiographs FINDINGS: Cardiomegaly with pulmonary vascular congestion and interstitial opacities are noted. Probable trace bilateral pleural effusions noted. No pneumothorax identified. No acute bony abnormalities are present. IMPRESSION: Cardiomegaly with pulmonary vascular congestion and probable interstitial pulmonary edema. Question trace  bilateral pleural effusions. Electronically Signed   By: Margarette Canada M.D.   On: 05/04/2020 06:53    EKG EKG Interpretation  Date/Time:  Sunday May 04 2020 05:11:09 EST Ventricular Rate:  99 PR Interval:  128 QRS Duration: 98 QT Interval:  362 QTC Calculation: 464 R Axis:   -20 Text Interpretation: Normal sinus rhythm Possible Left atrial enlargement Minimal voltage criteria for LVH, may be normal variant ( Cornell product ) T wave abnormality, consider lateral ischemia Prolonged QT Abnormal ECG similar to 7/12 Confirmed by Merrily Pew [id number]) on 05/04/2020 5:17:52 AM   Radiology DG Chest Portable 1 View  Result Date: 05/04/2020 CLINICAL DATA:  Acute shortness of breath and pain today. EXAM: PORTABLE CHEST 1 VIEW COMPARISON:  12/24/2019 and prior radiographs FINDINGS: Cardiomegaly with pulmonary vascular congestion and interstitial opacities are noted. Probable trace bilateral pleural effusions noted. No pneumothorax identified. No acute bony abnormalities are present. IMPRESSION: Cardiomegaly with pulmonary vascular congestion and probable interstitial pulmonary edema. Question trace bilateral pleural effusions. Electronically Signed   By: Margarette Canada M.D.   On: 05/04/2020 06:53    Procedures Procedures (including critical care time)  Medications Ordered in ED Medications  aspirin chewable tablet 324 mg (324 mg Oral Given 05/04/20 0556)  furosemide (LASIX) injection 40 mg (40 mg Intravenous Given  05/04/20 0557)  nitroGLYCERIN (NITROGLYN) 2 % ointment 1 inch (1 inch Topical Given 05/04/20 6834)    ED Course  I have reviewed the triage vital signs and the nursing notes.  Pertinent labs & imaging results that were available during my care of the patient were reviewed by me and considered in my medical decision making (see chart for details).    Final Clinical Impression(s) / ED Diagnoses Final diagnoses:  None   Admit for acute pulmonary edema.     Vonnie Spagnolo,  MD 05/04/20 [phone]

## 2020-05-04 NOTE — TOC Initial Note (Signed)
Transition of Care [facility]) - Initial/Assessment Note    Patient Details  Name: Chris [name] MRN: [id number] Date of Birth: [birth year]  [facility]) CM/SW Contact:    Verdell Carmine, RN Phone Number: 05/04/2020, 10:11 AM  Clinical Narrative:                 Patient admitted for encephalopathy. Lives in apartment is seen by community health and wellness. Has Medicaid, therefore is ineligible for the [facility] program, can obtain medications from [facility] and work with them for financial assistance. Was active with [facility] 4 moths ago. Will likely need HH post discharge. CM will follow for needs.  Expected Discharge Plan: [place] Barriers to Discharge: Continued Medical Work up   Patient Goals and CMS Choice        Expected Discharge Plan and Services Expected Discharge Plan: [place]   Discharge Planning Services: CM Consult   Living arrangements for the past 2 months: Apartment                                      Prior Living Arrangements/Services Living arrangements for the past 2 months: Apartment Lives with:: Self Patient language and need for interpreter reviewed:: Yes        Need for Family Participation in Patient Care: Yes (Comment) Care giver support system in place?: Yes (comment)   Criminal Activity/Legal Involvement Pertinent to Current Situation/Hospitalization: No - Comment as needed  Activities of Daily Living      Permission Sought/Granted                  Emotional Assessment       Orientation: : Fluctuating Orientation (Suspected and/or reported Sundowners) Alcohol / Substance Use: Tobacco Use, Illicit Drugs Psych Involvement: No (comment)  Admission diagnosis:  Acute decompensated heart failure ([place]) [I50.9] Patient Active Problem List   Diagnosis Date Noted  . Acute decompensated heart failure ([place]) 05/04/2020  . Chronic respiratory failure with hypoxia, on home  oxygen therapy ([place]) 12/28/2019  . Type 2 diabetes mellitus with stage 3 chronic kidney disease ([place]) 12/25/2019  . Acute and chronic respiratory failure (acute-on-chronic) ([place]) 12/25/2019  . Acute on chronic combined systolic and diastolic CHF (congestive heart failure) ([place]) 10/26/2019  . Elevated troponin I level 10/26/2019  . Acute on chronic diastolic (congestive) heart failure ([place]) 10/26/2019  . History of gout 02/01/2019  . Seasonal allergic rhinitis due to pollen 02/01/2019  . Tobacco dependence 11/30/2018  . Microscopic hematuria 11/30/2018  . Depression 11/30/2018  . Difficulty controlling anger 11/30/2018  . COPD (chronic obstructive pulmonary disease) ([place])   . CKD (chronic kidney disease) stage 3, GFR 30-59 ml/min (HCC) 08/10/2018  . Recurrent epistaxis 04/21/2018  . Mixed hyperlipidemia 07/28/2017  . Essential hypertension 07/28/2017  . Chronic systolic heart failure ([place]) 10/25/2014  . Cocaine abuse ([place]) 02/20/2013  . Cannabis abuse 02/20/2013  . Back pain, chronic 02/20/2013   PCP:  [name], MD Pharmacy:   [facility], [place] [address] [place] [address] Phone: [phone] Fax: [phone]     Social Determinants of Health (SDOH) Interventions    Readmission Risk Interventions Readmission Risk Prevention Plan 10/29/2019  Transportation Screening Complete  PCP or Specialist Appt within 3-5 Days Complete  HRI or Home Care Consult Complete  Social Work Consult for [place] Planning/Counseling [place] Not Applicable  Medication Review Press photographer) Complete  Some recent data might be hidden

## 2020-05-04 NOTE — Progress Notes (Signed)
Pt continues to have flucuation in behavior- throwing papers on the floor and puling off gown and and telemetry monitor. Was able let him calm down then replace the leads.

## 2020-05-04 NOTE — H&P (Addendum)
NAME:  Chris [name], MRN:  [id number], DOB:  26-Aug-1960, LOS: 0 ADMISSION DATE:  05/04/2020, Primary: [name], MD  CHIEF COMPLAINT:  Epigastric pain  Medical Service: Internal Medicine Teaching Service         Attending Physician: Dr. Oda Kilts, MD    First Contact: Dr. Darrick Meigs Pager: [phone]  Second Contact: Dr. Wynetta Emery Pager: [phone]       After Hours (After 5p/  First Contact Pager: [phone]  weekends / holidays): Second Contact Pager: [phone]    History of present illness   67 yom with NICM, chronic combined heart failure (EF 30-35%), hypertension, COPD, T2DM, and substance abuse.  History taking limited by patient continuing to fall asleep during the interview. Pt [facility] to [facility] for 1w history of progressive epigastric pain associated with cough and shortness of breath. Endorses orthopnea. Cough productive of white mucous. Epigastric pain is not exertionally related. Does not radiate to the back. Not pleuritic in nature. Not aggrevated or alleviated with oral intake. Denies recent fevers, chills, n/v, diarrhea. Endorses similar symptoms on prior heart failure exacerbations.  He admits to being non-compliant with his medications. His reasoning is that he "doesn't read or write too well" so he does not know which ones to take at what times.   He follows at [facility]. It appears that they have put forth an effort to get him set up with managed medicaid pharmacist however there has been difficulty contacting him to do so.  Past Medical History  He,  has a past medical history of CHF (congestive heart failure) ([place]), Chronic kidney disease, COPD (chronic obstructive pulmonary disease) ([place]), Coronary artery disease, Depression, Diabetes mellitus without complication ([place]), GERD (gastroesophageal reflux disease), Gout, Hypertension, Influenza A with respiratory manifestations, and Mental disorder.   Home Medications     Prior to Admission  medications   Medication Sig Start Date End Date Taking? Authorizing Provider  albuterol (PROVENTIL) (2.5 MG/3ML) 0.083% nebulizer solution Take 3 mLs by nebulization every 6 (six) hours as needed for shortness of breath. Patient not taking: Reported on 04/18/2020 04/17/20   [name], MD  albuterol (VENTOLIN HFA) 108 (90 Base) MCG/ACT inhaler Inhale 2 puffs into the lungs every 6 (six) hours as needed for wheezing or shortness of breath. Patient not taking: Reported on 04/18/2020 04/17/20   [name], MD  allopurinol (ZYLOPRIM) 100 MG tablet Take 2 tablets (200 mg total) by mouth daily. Patient not taking: Reported on 04/18/2020 04/17/20   [name], MD  aspirin 81 MG EC tablet Take 1 tablet (81 mg total) by mouth daily. Patient not taking: Reported on 04/18/2020 03/06/20   [name], MD  atorvastatin (LIPITOR) 40 MG tablet Take 1 tablet (40 mg total) by mouth daily at 6 PM. Patient not taking: Reported on 04/18/2020 04/17/20   [name], MD  blood glucose meter kit and supplies Dispense based on patient and insurance preference. Use up to four times daily as directed. (FOR ICD-10 E10.9, E11.9). Patient not taking: Reported on 04/18/2020 04/17/20   [name], MD  Blood Glucose Monitoring Suppl (ACCU-CHEK GUIDE) w/Device KIT USE AS DIRECTED Patient not taking: Reported on 04/18/2020 04/17/20   [name], MD  Blood Glucose Monitoring Suppl (TRUE METRIX METER) w/Device KIT Use as directed Patient not taking: Reported on 04/18/2020 04/17/20   [name], MD  budesonide-formoterol [facility]) 160-4.5 MCG/ACT inhaler Inhale 2 puffs into the lungs 2 (two)  times daily. Patient not taking: Reported on 04/18/2020 04/17/20   [name], MD  carvedilol (COREG) 25 MG tablet Take 1 tablet (25 mg total) by mouth 2 (two) times daily with a meal. Patient not taking: Reported on 04/18/2020 04/17/20   [name], MD  colchicine 0.6 MG tablet Take  half tablet by mouth every other day. Patient not taking: Reported on 04/18/2020 03/06/20   [name], MD  fluticasone [facility]) 50 MCG/ACT nasal spray Place 1 spray into both nostrils daily as needed for allergies or rhinitis. Patient not taking: Reported on 04/18/2020 03/06/20   [name], MD  furosemide (LASIX) 40 MG tablet Take 1.5 tablets (60 mg total) by mouth daily. Patient not taking: Reported on 04/18/2020 04/17/20   [name], MD  glipiZIDE (GLUCOTROL) 5 MG tablet Take 0.5 tablets (2.5 mg total) by mouth 2 (two) times daily before a meal. Patient not taking: Reported on 04/18/2020 04/17/20   [name], MD  glucose blood (ACCU-CHEK GUIDE) test strip Use as instructed. Check blood glucose by fingerstick once per day. Patient not taking: Reported on 04/18/2020 03/06/20   [name], MD  glucose blood (TRUE METRIX BLOOD GLUCOSE TEST) test strip Use as instructed Patient not taking: Reported on 04/18/2020 12/28/19   [name], MD  isosorbide-hydrALAZINE (BIDIL) 20-37.5 MG tablet Take 1 tablet by mouth 3 (three) times daily. Patient not taking: Reported on 04/18/2020 04/17/20   [name], MD  Lancet Devices (LANCING DEVICE) MISC 1 Device by Does not apply route daily. Patient not taking: Reported on 04/18/2020 12/27/19   Regalado, Jerald Kief A, MD  montelukast (SINGULAIR) 10 MG tablet Take 1 tablet (10 mg total) by mouth at bedtime. Patient not taking: Reported on 04/18/2020 04/17/20 07/16/20  [name], MD  sacubitril-valsartan (ENTRESTO) 24-26 MG Take 1 tablet by mouth 2 (two) times daily. Patient not taking: Reported on 04/18/2020 04/17/20   [name], MD  TRUEplus Lancets 28G MISC Use as directed Patient not taking: Reported on 04/18/2020 12/28/19   [name], MD    Allergies    Allergies as of 05/04/2020  . (No Known Allergies)    Social History   reports that he has been smoking cigarettes. He has a 20.00 pack-year  smoking history. He has never used smokeless tobacco. He reports current drug use. Frequency: 21.00 times per week. Drugs: Marijuana and Cocaine. He reports that he does not drink alcohol.   Family History   His family history includes Diabetes in his mother; HIV in his brother; Healthy in his daughter and son; Heart disease in his father.    ROS  Negative unless stated in HPI  Objective   Blood pressure (!) 142/95, pulse 82, temperature 97.8 F (36.6 C), resp. rate 13, height 6' (1.829 m), weight 123.1 kg, SpO2 98 %.    Filed Weights   05/04/20 0524  Weight: 123.1 kg    Examination: GENERAL: acutely ill appearing. Repeatedly falling asleep HEENT: normal CARDIAC: heart RRR. No peripheral edema. Unable to assess JVD in the setting of severe orthopnea PULMONARY: breathing comfortably on 2L [place]. bibasilar crackles appreciated.  ABDOMEN: abdomen distended. bs active. Non-tender to palpation. NEURO: repeatedly falling asleep during exam however is oriented when awake.  SKIN: no rash or lesions on limited exam  MSK: moves all extremities  Significant Diagnostic Tests:  CXR: cardiomegaly. Pulmonary vascular congestion and probable interstitial edema.   EKG unchanged from prior.  Labs  CBC Latest Ref Rng & Units 05/04/2020 05/04/2020 12/25/2019  WBC 4.0 - 10.5 K/uL - 10.1 7.9  Hemoglobin 13.0 - 17.0 g/dL 13.9 14.4 16.7  Hematocrit 39 - 52 % 41.0 45.1 52.3(H)  Platelets 150 - 400 K/uL - 246 196   BMP Latest Ref Rng & Units 05/04/2020 01/14/2020 12/27/2019  Glucose 70 - 99 mg/dL 94 54(L) 114(H)  BUN 6 - 20 mg/dL 31(H) 24 40(H)  Creatinine 0.61 - 1.24 mg/dL 1.90(H) 1.97(H) 2.20(H)  BUN/Creat Ratio 6 - 22 (calc) - 12 -  Sodium 135 - 145 mmol/L 140 139 136  Potassium 3.5 - 5.1 mmol/L 4.6 4.7 4.0  Chloride 98 - 111 mmol/L 108 103 98  CO2 20 - 32 mmol/L - 27 27  Calcium 8.6 - 10.3 mg/dL - 9.2 8.5(L)     Summary  11 yom with combined heart failure (EF 30-35%) secondary to NICM who  was admitted to IMTS for ongoing management of decompensated heart failure in the setting of medication non-compliance.  Assessment & Plan:  Active Problems:   Acute decompensated heart failure (HCC)  NICM with combined heart failure (EF 30-35%). BNP ~2300 on admission. (~1500 on last heart failure admission) 12/25/19 echocardiogram: LVEF 30-35%, global hypokinesis of LV. No valvular dysfunction noted. No prior heart cath noted. Follows with Dr. Gardiner Rhyme at [facility].  Epigastric/Chest pain. Suspect this is from gut edema. EKG unchanged from prior, mild trop leak (96). Risk factors: hypertension, HLD, DM, obeisity, current smoker. HEART score: 4 Troponin leak likely due to demand and is trending down 96>74. Low suspicion for ACS Plan -lasix 30m BID -resume home entresto -holding home bidil as he has not been taking it. -serial trops -check magnesium -daily BMP -strict I/O, tele monitoring -would benefit from simplification of his heart failure regimen to hopefully improve compliance.  Acute encephalopathy. Difficult to keep patient awake during interview and exam. Repeatedly was falling asleep. Acute Respiratory Failure secondary to pulm edema secondary to CHF exacerbation. Currently doing well on 2L [place]. Plan: will check a VBG to evaluate for hypercapnia. May benefit from BiPAP to help with pulm edema.  Substance abuse (cocaine, THC) as noted on UDS. Pt reports last cocaine use to be 2d prior. Encouraged cessation, especially in the setting of his heart failure. TOC consulted.  Chronic hypertension, hyperlipidemia -hold coreg since he has not been taking it and is presenting in acute decompensated heart failure  -continue lipitor -continue aspirin 88m Illiteracy. Pt notes he has limited reading and writing ability which is impacting his abilities to take his medications.   COPD- continue dulera, albuterol prn Chronic gout-continue allopurinol Morbid obesity-- consult to  RD T2DM: resistant SSI CKD stage 3: renal function at baseline.     Best practice:  CODE STATUS: Full Diet: HH DVT for prophylaxis: lovenox Social considerations/Family communication: recommend med list simplification at discharge. TOC consulted Dispo: Admit patient to Inpatient with expected length of stay greater than 2 midnights.   RyMitzi HansenMD Internal Medicine Resident PGY-2 [facility] Pager: #[phone]/21/2021 8:51 AM

## 2020-05-04 NOTE — ED Notes (Signed)
Pt has labored breathing and is complaining of upper abdominal pain that he describes as a sharp shooting pressure. Also complaining of right elbow pain and swelling that started when he woke up this morning, denies injury to extremity  Asking for food and water, informed he needs to remain NPO while the provider awaits further testing and results.  Placed in 2L [place] for comfort. Pt has productive cough. States he recently tested negative for COVID but does not remember the date of test

## 2020-05-05 ENCOUNTER — Inpatient Hospital Stay (HOSPITAL_COMMUNITY): Payer: Medicaid Other

## 2020-05-05 ENCOUNTER — Other Ambulatory Visit (HOSPITAL_COMMUNITY): Payer: Self-pay | Admitting: Internal Medicine

## 2020-05-05 DIAGNOSIS — M25721 Osteophyte, right elbow: Secondary | ICD-10-CM | POA: Diagnosis not present

## 2020-05-05 DIAGNOSIS — M778 Other enthesopathies, not elsewhere classified: Secondary | ICD-10-CM | POA: Diagnosis not present

## 2020-05-05 DIAGNOSIS — I5043 Acute on chronic combined systolic (congestive) and diastolic (congestive) heart failure: Secondary | ICD-10-CM | POA: Diagnosis not present

## 2020-05-05 LAB — GLUCOSE, CAPILLARY
Glucose-Capillary: 101 mg/dL — ABNORMAL HIGH (ref 70–99)
Glucose-Capillary: 101 mg/dL — ABNORMAL HIGH (ref 70–99)

## 2020-05-05 LAB — BASIC METABOLIC PANEL
Anion gap: 12 (ref 5–15)
BUN: 23 mg/dL — ABNORMAL HIGH (ref 6–20)
CO2: 27 mmol/L (ref 22–32)
Calcium: 9.3 mg/dL (ref 8.9–10.3)
Chloride: 102 mmol/L (ref 98–111)
Creatinine, Ser: 1.91 mg/dL — ABNORMAL HIGH (ref 0.61–1.24)
GFR, Estimated: 40 mL/min — ABNORMAL LOW (ref >60)
Glucose, Bld: 111 mg/dL — ABNORMAL HIGH (ref 70–99)
Potassium: 3.7 mmol/L (ref 3.5–5.1)
Sodium: 141 mmol/L (ref 135–145)

## 2020-05-05 MED ORDER — DICLOFENAC SODIUM 1 % EX GEL: 2.0000 g | Freq: Four times a day (QID) | CUTANEOUS | 3 refills | Status: DC

## 2020-05-05 MED ORDER — MAGNESIUM SULFATE 2 GM/50ML IV SOLN
2.0000 g | Freq: Once | INTRAVENOUS | Status: AC
  Administered 2020-05-05: 2 g via INTRAVENOUS
  Filled 2020-05-05: qty 50

## 2020-05-05 MED ORDER — FUROSEMIDE 40 MG PO TABS: 60.0000 mg | ORAL_TABLET | Freq: Every day | ORAL | 2 refills | Status: DC

## 2020-05-05 MED FILL — DICLOFENAC SODIUM 1% GEL: 1 | 12 days supply | Qty: 100 | Fill #0

## 2020-05-05 NOTE — Plan of Care (Signed)
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
## 2020-05-05 NOTE — Discharge Instructions (Addendum)
Elbow Bursitis Bursitis is swelling and pain at the tip of your elbow. This happens when fluid builds up in a sac under your skin (bursa). This may also be called olecranon bursitis. Follow these instructions at home: Medicines  Take over-the-counter and prescription medicines only as told by your doctor.  If you were prescribed an antibiotic, take it exactly as told by your doctor. Do not stop taking it even if you start to feel better. Managing pain, stiffness, and swelling   If told, put ice on your elbow: ? Put ice in a plastic bag. ? Place a towel between your skin and the bag. ? Leave the ice on for 20 minutes, 2-3 times a day.  If your bursitis is caused by an injury, follow instructions from your doctor about: ? Resting your elbow. ? Wearing a bandage.  Wear elbow pads or elbow wraps as needed. These help cushion your elbow. General instructions  Avoid any activities that cause elbow pain. Ask your doctor what activities are safe for you.  Keep all follow-up visits as told by your doctor. This is important. Contact a doctor if you have:  A fever.  Problems that do not get better with treatment.  Pain or swelling that: ? Gets worse. ? Goes away and then comes back.  Pus draining from your elbow. Get help right away if you have:  Trouble moving your arm, hand, or fingers. Summary  Bursitis is swelling and pain at the tip of the elbow.  You may need to take medicine or put ice on your elbow.  Contact your doctor if your problems do not get better with treatment. This information is not intended to replace advice given to you by your health care provider. Make sure you discuss any questions you have with your health care provider. Document Revised: 05/13/2017 Document Reviewed: 05/10/2017 Elsevier Patient Education  2020 [place], Consistent Carbohydrate Nutrition Therapy   A heart-healthy and consistent carbohydrate diet is recommended to  manage heart disease and diabetes. To follow a heart-healthy and consistent carbohydrate diet, . Eat a balanced diet with whole grains, fruits and vegetables, and lean protein sources.  . Choose heart-healthy unsaturated fats. Limit saturated fats, trans fats, and cholesterol intake. Eat more plant-based or vegetarian meals using beans and soy foods for protein.  . Eat whole, unprocessed foods to limit the amount of sodium (salt) you eat.  . Choose a consistent amount of carbohydrate at each meal and snack. Limit refined carbohydrates especially sugar, sweets and sugar-sweetened beverages.  . If you drink alcohol, do so in moderation: one serving per day (women) and two servings per day (men). o One serving is equivalent to 12 ounces beer, 5 ounces wine, or 1.5 ounces distilled spirits  Tips Tips for Choosing Heart-Healthy Fats Choose lean protein and low-fat dairy foods to reduce saturated fat intake. . Saturated fat is usually found in animal-based protein and is associated with certain health risks. Saturated fat is the biggest contributor to raise low-density lipoprotein (LDL) cholesterol levels. Research shows that limiting saturated fat lowers unhealthy cholesterol levels. Eat no more than 7% of your total calories each day from saturated fat. Ask your RDN to help you determine how much saturated fat is right for you. . There are many foods that do not contain large amounts of saturated fats. Swapping these foods to replace foods high in saturated fats will help you limit the saturated fat you eat and improve your cholesterol levels. You  can also try eating more plant-based or vegetarian meals. Instead of. Try:  Whole milk, cheese, yogurt, and ice cream 1% or skim milk, low-fat cheese, non-fat yogurt, and low-fat ice cream  Fatty, marbled beef and pork Lean beef, pork, or venison  Poultry with skin Poultry without skin  Butter, stick margarine Reduced-fat, whipped, or liquid spreads  Coconut  oil, palm oil Liquid vegetable oils: corn, canola, olive, soybean and safflower oils   Avoid foods that contain trans fats. . Trans fats increase levels of LDL-cholesterol. Hydrogenated fat in processed foods is the main source of trans fats in foods.  . Trans fats can be found in stick margarine, shortening, processed sweets, baked goods, some fried foods, and packaged foods made with hydrogenated oils. Avoid foods with "partially hydrogenated oil" on the ingredient list such as: cookies, pastries, baked goods, biscuits, crackers, microwave popcorn, and frozen dinners. Choose foods with heart healthy fats. . Polyunsaturated and monounsaturated fat are unsaturated fats that may help lower your blood cholesterol level when used in place of saturated fat in your diet. . Ask your RDN about taking a dietary supplement with plant sterols and stanols to help lower your cholesterol level. [name] Research shows that substituting saturated fats with unsaturated fats is beneficial to cholesterol levels. Try these easy swaps: Instead of. Try:  Butter, stick margarine, or solid shortening Reduced-fat, whipped, or liquid spreads  Beef, pork, or poultry with skin Fish and seafood  Chips, crackers, snack foods Raw or unsalted nuts and seeds or nut butters Hummus with vegetables Avocado on toast  Coconut oil, palm oil Liquid vegetable oils: corn, canola, olive, soybean and safflower oils  Limit the amount of cholesterol you eat to less than 200 milligrams per day. . Cholesterol is a substance carried through the bloodstream via lipoproteins, which are known as "transporters" of fat. Some body functions need cholesterol to work properly, but too much cholesterol in the bloodstream can damage arteries and build up blood vessel linings (which can lead to heart attack and stroke). You should eat less than 200 milligrams cholesterol per day. [name] People respond differently to eating cholesterol. There is no test available  right now that can figure out which people will respond more to dietary cholesterol and which will respond less. For individuals with high intake of dietary cholesterol, different types of increase (none, small, moderate, large) in LDL-cholesterol levels are all possible.  . Food sources of cholesterol include egg yolks and organ meats such as liver, gizzards. Limit egg yolks to two to four per week and avoid organ meats like liver and gizzards to control cholesterol intake. Tips for Choosing Heart-Healthy Carbohydrates Consume a consistent amount of carbohydrate . It is important to eat foods with carbohydrates in moderation because they impact your blood glucose level. Carbohydrates can be found in many foods such as: . Grains (breads, crackers, rice, pasta, and cereals)  . Starchy Vegetables (potatoes, corn, and peas)  . Beans and legumes  . Milk, soy milk, and yogurt  . Fruit and fruit juice  . Sweets (cakes, cookies, ice cream, jam and jelly) . Your RDN will help you set a goal for how many carbohydrate servings to eat at your meals and snacks. For many adults, eating 3 to 5 servings of carbohydrate foods at each meal and 1 or 2 carbohydrate servings for each snack works well.  . Check your blood glucose level regularly. It can tell you if you need to adjust when you eat carbohydrates. . Choose  foods rich in viscous (soluble) fiber . Viscous, or soluble, is found in the walls of plant cells. Viscous fiber is found only in plant-based foods. Eating foods with fiber helps to lower your unhealthy cholesterol and keep your blood glucose in range  . Rich sources of viscous fiber include vegetables (asparagus, Brussels sprouts, sweet potatoes, turnips) fruit (apricots, mangoes, oranges), legumes, and whole grains (barley, oats, and oat bran).  . As you increase your fiber intake gradually, also increase the amount of water you drink. This will help prevent constipation.  . If you have difficulty  achieving this goal, ask your RDN about fiber laxatives. Choose fiber supplements made with viscous fibers such as psyllium seed husks or methylcellulose to help lower unhealthy cholesterol.  . Limit refined carbohydrates  . There are three types of carbohydrates: starches, sugar, and fiber. Some carbohydrates occur naturally in food, like the starches in rice or corn or the sugars in fruits and milk. Refined carbohydrates--foods with high amounts of simple sugars--can raise triglyceride levels. High triglyceride levels are associated with coronary heart disease. . Some examples of refined carbohydrate foods are table sugar, sweets, and beverages sweetened with added sugar. Tips for Reducing Sodium (Salt) Although sodium is important for your body to function, too much sodium can be harmful for people with high blood pressure. As sodium and fluid buildup in your tissues and bloodstream, your blood pressure increases. High blood pressure may cause damage to other organs and increase your risk for a stroke. Even if you take a pill for blood pressure or a water pill (diuretic) to remove fluid, it is still important to have less salt in your diet. Ask your doctor and RDN what amount of sodium is right for you. [name] Avoid processed foods. Eat more fresh foods.  . Fresh fruits and vegetables are naturally low in sodium, as well as frozen vegetables and fruits that have no added juices or sauces.  . Fresh meats are lower in sodium than processed meats, such as bacon, sausage, and hotdogs. Read the nutrition label or ask your butcher to help you find a fresh meat that is low in sodium. . Eat less salt--at the table and when cooking.  . A single teaspoon of table salt has 2,300 mg of sodium.  . Leave the salt out of recipes for pasta, casseroles, and soups.  . Ask your RDN how to cook your favorite recipes without sodium . Be a Paramedic.  . Look for food packages that say "salt-free" or "sodium-free." These  items contain less than 5 milligrams of sodium per serving.  [name] "Very low-sodium" products contain less than 35 milligrams of sodium per serving.  [name] "Low-sodium" products contain less than 140 milligrams of sodium per serving.  . Beware for "Unsalted" or "No Added Salt" products. These items may still be high in sodium. Check the nutrition label. . Add flavors to your food without adding sodium.  . Try lemon juice, lime juice, fruit juice or vinegar.  . Dry or fresh herbs add flavor. Try basil, bay leaf, dill, rosemary, parsley, sage, dry mustard, nutmeg, thyme, and paprika.  . Pepper, red pepper flakes, and cayenne pepper can add spice t your meals without adding sodium. Hot sauce contains sodium, but if you use just a drop or two, it will not add up to much.  Sharyn Lull a sodium-free seasoning blend or make your own at home. Additional Lifestyle Tips Achieve and maintain a healthy weight. . Talk with your  RDN or your doctor about what is a healthy weight for you. . Set goals to reach and maintain that weight.  . To lose weight, reduce your calorie intake along with increasing your physical activity. A weight loss of 10 to 15 pounds could reduce LDL-cholesterol by 5 milligrams per deciliter. Participate in physical activity. . Talk with your health care team to find out what types of physical activity are best for you. Set a plan to get about 30 minutes of exercise on most days.  Foods Recommended Food Group Foods Recommended  Grains Whole grain breads and cereals, including whole wheat, barley, rye, buckwheat, corn, teff, quinoa, millet, amaranth, brown or wild rice, sorghum, and oats Pasta, especially whole wheat or other whole grain types  [employer], quinoa or wild rice Whole grain crackers, bread, rolls, pitas Home-made bread with reduced-sodium baking soda  Protein Foods Lean cuts of beef and pork (loin, leg, round, extra lean hamburger)  Skinless Cytogeneticist and other wild  game Dried beans and peas Nuts and nut butters Meat alternatives made with soy or textured vegetable protein  Egg whites or egg substitute Cold cuts made with lean meat or soy protein  Dairy Nonfat (skim), low-fat, or 1%-fat milk  Nonfat or low-fat yogurt or cottage cheese Fat-free and low-fat cheese  Vegetables Fresh, frozen, or canned vegetables without added fat or salt   Fruits Fresh, frozen, canned, or dried fruit   Oils Unsaturated oils (corn, olive, peanut, soy, sunflower, canola)  Soft or liquid margarines and vegetable oil spreads  Salad dressings Seeds and nuts  Avocado   Foods Not Recommended Food Group Foods Not Recommended  Grains Breads or crackers topped with salt Cereals (hot or cold) with more than 300 mg sodium per serving Biscuits, cornbread, and other "quick" breads prepared with baking soda Bread crumbs or stuffing mix from a store High-fat bakery products, such as doughnuts, biscuits, croissants, danish pastries, pies, cookies Instant cooking foods to which you add hot water and stir--potatoes, noodles, rice, etc. Packaged starchy foods--seasoned noodle or rice dishes, stuffing mix, macaroni and cheese dinner Snacks made with partially hydrogenated oils, including chips, cheese puffs, snack mixes, regular crackers, butter-flavored popcorn  Protein Foods Higher-fat cuts of meats (ribs, t-bone steak, regular hamburger) Bacon, sausage, or hot dogs Cold cuts, such as salami or bologna, deli meats, cured meats, corned beef Organ meats (liver, brains, gizzards, sweetbreads) Poultry with skin Fried or smoked meat, poultry, and fish Whole eggs and egg yolks (more than 2-4 per week) Salted legumes, nuts, seeds, or nut/seed butters Meat alternatives with high levels of sodium (>300 mg per serving) or saturated fat (>5 g per serving)  Dairy Whole milk,?2% fat milk, buttermilk Whole milk yogurt or ice cream Cream Half-&-half Cream cheese Sour cream Cheese   Vegetables Canned or frozen vegetables with salt, fresh vegetables prepared with salt, butter, cheese, or cream sauce Fried vegetables Pickled vegetables such as olives, pickles, or sauerkraut  Fruits Fried fruits Fruits served with butter or cream  Oils Butter, stick margarine, shortening Partially hydrogenated oils or trans fats Tropical oils (coconut, palm, palm kernel oils)  Other Candy, sugar sweetened soft drinks and desserts Salt, sea salt, garlic salt, and seasoning mixes containing salt Bouillon cubes Ketchup, barbecue sauce, Worcestershire sauce, soy sauce, teriyaki sauce Miso Salsa Pickles, olives, relish   Heart Healthy Consistent Carbohydrate Vegetarian (Lacto-Ovo) Sample 1-Day Menu  Breakfast 1 cup oatmeal, cooked (2 carbohydrate servings)   cup blueberries (1 carbohydrate serving)  11  almonds, without salt  1 cup 1% milk (1 carbohydrate serving)  1 cup coffee  Morning Snack 1 cup fat-free plain yogurt (1 carbohydrate serving)  Lunch 1 whole wheat bun (1 carbohydrate servings)  1 black bean burger (1 carbohydrate servings)  1 slice cheddar cheese, low sodium  2 slices tomatoes  2 leaves lettuce  1 teaspoon mustard  1 small pear (1 carbohydrate servings)  1 cup green tea, unsweetened  Afternoon Snack 1/3 cup trail mix with nuts, seeds, and raisins, without salt (1 carbohydrate servinga)  Evening Meal  cup meatless chicken  2/3 cup brown rice, cooked (2 carbohydrate servings)  1 cup broccoli, cooked (2/3 carbohydrate serving)   cup carrots, cooked (1/3 carbohydrate serving)  2 teaspoons olive oil  1 teaspoon balsamic vinegar  1 whole wheat dinner roll (1 carbohydrate serving)  1 teaspoon margarine, soft, tub  1 cup 1% milk (1 carbohydrate serving)  Evening Snack 1 extra small banana (1 carbohydrate serving)  1 tablespoon peanut butter   Heart Healthy Consistent Carbohydrate Vegan Sample 1-Day Menu  Breakfast 1 cup oatmeal, cooked (2 carbohydrate  servings)   cup blueberries (1 carbohydrate serving)  11 almonds, without salt  1 cup soymilk fortified with calcium, vitamin B12, and vitamin D  1 cup coffee  Morning Snack 6 ounces soy yogurt (1 carbohydrate servings)  Lunch 1 whole wheat bun(1 carbohydrate servings)  1 black bean burger (1 carbohydrate serving)  2 slices tomatoes  2 leaves lettuce  1 teaspoon mustard  1 small pear (1 carbohydrate servings)  1 cup green tea, unsweetened  Afternoon Snack 1/3 cup trail mix with nuts, seeds, and raisins, without salt (1 carbohydrate servings)  Evening Meal  cup meatless chicken  2/3 cup brown rice, cooked (2 carbohydrate servings)  1 cup broccoli, cooked (2/3 carbohydrate serving)   cup carrots, cooked (1/3 carbohydrate serving)  2 teaspoons olive oil  1 teaspoon balsamic vinegar  1 whole wheat dinner roll (1 carbohydrate serving)  1 teaspoon margarine, soft, tub  1 cup soymilk fortified with calcium, vitamin B12, and vitamin D  Evening Snack 1 extra small banana (1 carbohydrate serving)  1 tablespoon peanut butter    Heart Healthy Consistent Carbohydrate Sample 1-Day Menu  Breakfast 1 cup cooked oatmeal (2 carbohydrate servings)  3/4 cup blueberries (1 carbohydrate serving)  1 ounce almonds  1 cup skim milk (1 carbohydrate serving)  1 cup coffee  Morning Snack 1 cup sugar-free nonfat yogurt (1 carbohydrate serving)  Lunch 2 slices whole-wheat bread (2 carbohydrate servings)  2 ounces lean [place] breast  1 ounce low-fat Swiss cheese  1 teaspoon mustard  1 slice tomato  1 lettuce leaf  1 small pear (1 carbohydrate serving)  1 cup skim milk (1 carbohydrate serving)  Afternoon Snack 1 ounce trail mix with unsalted nuts, seeds, and raisins (1 carbohydrate serving)  Evening Meal 3 ounces salmon  2/3 cup cooked brown rice (2 carbohydrate servings)  1 teaspoon soft margarine  1 cup cooked broccoli with 1/2 cup cooked carrots (1 carbohydrate serving  Carrots, cooked,  boiled, drained, without salt  1 cup lettuce  1 teaspoon olive oil with vinegar for dressing  1 small whole grain roll (1 carbohydrate serving)  1 teaspoon soft margarine  1 cup unsweetened tea  Evening Snack 1 extra-small banana (1 carbohydrate serving)  Copyright 2020  [employer]. All rights reserved.

## 2020-05-05 NOTE — Progress Notes (Signed)
D/C instructions given and reviewed. No questions asked but encouraged to call with any concerns. IV removed, tolerated well. 

## 2020-05-05 NOTE — Progress Notes (Signed)
Patient refusing telemetry and Bed Alarm. Patient told the importance of telemetry and still refused. Patient continues to request water and ice despite being told the importance of getting fluid out of his system via Lasix.

## 2020-05-05 NOTE — Discharge Summary (Signed)
Name: Chris [name] MRN: [id number] DOB: [birth year] 59 y.o. PCP: [name], MD  Date of Admission: 05/04/2020  5:18 AM Date of Discharge: 11/22/202111/22/21 Attending Physician: Lenice Pressman MD  Discharge Diagnosis: 1. Acute on chronic combined systolic/diastolic heart failure 2. Elbow pain  Discharge Medications: Allergies as of 05/05/2020   No Known Allergies     Medication List    TAKE these medications   Accu-Chek Guide w/Device Kit USE AS DIRECTED   True Metrix Meter w/Device Kit Use as directed   albuterol 108 (90 Base) MCG/ACT inhaler Commonly known as: VENTOLIN HFA Inhale 2 puffs into the lungs every 6 (six) hours as needed for wheezing or shortness of breath.   albuterol (2.5 MG/3ML) 0.083% nebulizer solution Commonly known as: PROVENTIL Take 3 mLs by nebulization every 6 (six) hours as needed for shortness of breath.   allopurinol 100 MG tablet Commonly known as: ZYLOPRIM Take 2 tablets (200 mg total) by mouth daily.   aspirin 81 MG EC tablet Take 1 tablet (81 mg total) by mouth daily.   atorvastatin 40 MG tablet Commonly known as: LIPITOR Take 1 tablet (40 mg total) by mouth daily at 6 PM.   blood glucose meter kit and supplies Dispense based on patient and insurance preference. Use up to four times daily as directed. (FOR ICD-10 E10.9, E11.9).   budesonide-formoterol 160-4.5 MCG/ACT inhaler Commonly known as: SYMBICORT Inhale 2 puffs into the lungs 2 (two) times daily.   carvedilol 25 MG tablet Commonly known as: COREG Take 1 tablet (25 mg total) by mouth 2 (two) times daily with a meal.   diclofenac Sodium 1 % Gel Commonly known as: VOLTAREN Apply 2 g topically 4 (four) times daily. Use on elbow pain   furosemide 40 MG tablet Commonly known as: LASIX Take 1.5 tablets (60 mg total) by mouth daily.   glipiZIDE 5 MG tablet Commonly known as: GLUCOTROL Take 0.5 tablets (2.5 mg total) by mouth 2 (two) times daily before a  meal.   isosorbide-hydrALAZINE 20-37.5 MG tablet Commonly known as: BIDIL Take 1 tablet by mouth 3 (three) times daily.   Lancing Device Misc 1 Device by Does not apply route daily.   montelukast 10 MG tablet Commonly known as: SINGULAIR Take 1 tablet (10 mg total) by mouth at bedtime.   sacubitril-valsartan 24-26 MG Commonly known as: ENTRESTO Take 1 tablet by mouth 2 (two) times daily.   True Metrix Blood Glucose Test test strip Generic drug: glucose blood Use as instructed   Accu-Chek Guide test strip Generic drug: glucose blood Use as instructed. Check blood glucose by fingerstick once per day.   TRUEplus Lancets 28G Misc Use as directed       Disposition and follow-up:   Mr.Chris [name] was discharged from [facility] in Stable condition.  At the hospital follow up visit please address:  1.  Combined chronic systolic/diastolic heart failure exacerbation - Admit with heart failure exacerbation - Discharge weight 115.1kg after 10L of diuresis - Ensure he takes his furosemide as prescribed - Continue to encourage cessation from cocaine use  2. Elbow pain - X-ray negative for fracture - Advised to use voltaren gel at d/c  2.  Labs / imaging needed at time of follow-up: bmp, cbc  3.  Pending labs/ test needing follow-up: N/A  Follow-up Appointments:  Follow-up Information    [name], MD. Go on 05/27/2020.   Specialty: Internal Medicine Why: @9 :00am Contact information: [place]  [place] [address] [phone]               Hospital Course by problem list: 1. Acute on chronic combined systolic/diastolic heart failure:  Chris [name] is a 59 y.o. M with PMH of NICM, HFrEF (EF 30-35%), cocaine abuse, HTN, HLD who presented to [facility] with shortness of breath and cough. He was found to have elevated bnp of 2300 with hypervolemia on physical exam and chest X-ray suggesting pulmonary edema. He mentions not taking  his home furosemide and continuing to use cocaine. He was diuresed with IV furosemide with 10L net diuresis. His respiratory status was observed to be stable on room air and he was discharged with recommendation to continues home regimen  2. Elbow Pain: Patient complained of acute onset elbow pain on admission. X-ray of elbow showed no acute pathology. Though to be due to possible bursitis vs symptomatic olecranon process spur. Discharged with voltaren gel for symptoms relief.  Discharge Vitals:   BP (!) 175/115   Pulse 78   Temp 97.6 F (36.4 C) (Oral)   Resp 16   Ht 6' (1.829 m)   Wt 115.1 kg   SpO2 96%   BMI 34.41 kg/m   Pertinent Labs, Studies, and Procedures:  CBC Latest Ref Rng & Units 05/04/2020 05/04/2020 12/25/2019  WBC 4.0 - 10.5 K/uL - 10.1 7.9  Hemoglobin 13.0 - 17.0 g/dL 13.9 14.4 16.7  Hematocrit 39 - 52 % 41.0 45.1 52.3(H)  Platelets 150 - 400 K/uL - 246 196   BMP Latest Ref Rng & Units 05/05/2020 05/04/2020 05/04/2020  Glucose 70 - 99 mg/dL 111(H) - 94  BUN 6 - 20 mg/dL 23(H) - 31(H)  Creatinine 0.61 - 1.24 mg/dL 1.91(H) 1.88(H) 1.90(H)  BUN/Creat Ratio 6 - 22 (calc) - - -  Sodium 135 - 145 mmol/L 141 - 140  Potassium 3.5 - 5.1 mmol/L 3.7 - 4.6  Chloride 98 - 111 mmol/L 102 - 108  CO2 22 - 32 mmol/L 27 - -  Calcium 8.9 - 10.3 mg/dL 9.3 - -   PORTABLE CHEST 1 VIEW  COMPARISON:  12/24/2019 and prior radiographs  FINDINGS: Cardiomegaly with pulmonary vascular congestion and interstitial opacities are noted.  Probable trace bilateral pleural effusions noted.  No pneumothorax identified.  No acute bony abnormalities are present.  IMPRESSION: Cardiomegaly with pulmonary vascular congestion and probable interstitial pulmonary edema. Question trace bilateral pleural effusions.  RIGHT ELBOW - 2 VIEW  COMPARISON:  None.  FINDINGS: Frontal and lateral views were obtained. No appreciable fracture or dislocation. No appreciable joint effusion.  There is a spur arising from the olecranon process of the proximal ulna. No appreciable joint space narrowing or erosion.  IMPRESSION: Olecranon process spur. No appreciable joint space narrowing. No fracture or dislocation evident.  Discharge Instructions: Discharge Instructions    Diet - low sodium heart healthy   Complete by: As directed    Discharge instructions   Complete by: As directed    Dear [name]  You came to [place] with shortness of breath. We have determined this was caused by heart failure. Here are our recommendations for you at discharge:  Please take your home medications as prescribed, especially your furosemide. Use voltaren as needed for your elbow pain Please make sure to follow up with your primary care provider Please try to avoid any further cocaine use.  Thank you for choosing [facility].   Increase activity slowly   Complete by: As directed  Signed: [name], MD 05/07/2020, 8:56 AM Pager: [phone] After 5pm on weekdays and 1pm on weekends: On Call Pager: [phone]

## 2020-05-05 NOTE — Progress Notes (Signed)
Nutrition Education Note  RD consulted for nutrition education regarding CHF and diabetes.  Lab Results  Component Value Date   HGBA1C 6.2 (H) 05/04/2020   PTA DM medications are 2.5mg  glipizide BID.   Labs reviewed: CBGS: 101 (inpatient orders for glycemic control are 0-20 units insulin aspart TID with meals).    RD provided "Heart Healthy, Consistent Carbohydrate Nutrition Therapy" handout from the Academy of Nutrition and Dietetics.   Attempted to speak with pt x 2, however, unavailable at both attempts. Per chart review, pt has been verbally abusive to staff.   Per MD notes, plan for discharge home today.  Current diet order is heart healthy, patient is consuming approximately 100% of meals at this time. Labs and medications reviewed. No further nutrition interventions warranted at this time. RD contact information provided. If additional nutrition issues arise, please re-consult RD.   Loistine Chance, RD, LDN, [place] Registered Dietitian II Certified Diabetes Care and Education Specialist Please refer to [facility] for RD and/or RD on-call/weekend/after hours pager

## 2020-05-06 ENCOUNTER — Encounter: Payer: Self-pay | Admitting: Internal Medicine

## 2020-05-06 ENCOUNTER — Other Ambulatory Visit: Payer: Self-pay

## 2020-05-06 ENCOUNTER — Other Ambulatory Visit: Payer: Self-pay | Admitting: *Deleted

## 2020-05-06 ENCOUNTER — Ambulatory Visit: Payer: Medicaid Other | Attending: Internal Medicine | Admitting: Internal Medicine

## 2020-05-06 ENCOUNTER — Telehealth: Payer: Self-pay | Admitting: Internal Medicine

## 2020-05-06 ENCOUNTER — Telehealth: Payer: Self-pay

## 2020-05-06 VITALS — BP 152/108 | HR 93 | Resp 16 | Wt 256.4 lb

## 2020-05-06 DIAGNOSIS — Z2821 Immunization not carried out because of patient refusal: Secondary | ICD-10-CM | POA: Insufficient documentation

## 2020-05-06 DIAGNOSIS — E669 Obesity, unspecified: Secondary | ICD-10-CM

## 2020-05-06 DIAGNOSIS — I5043 Acute on chronic combined systolic (congestive) and diastolic (congestive) heart failure: Secondary | ICD-10-CM | POA: Diagnosis not present

## 2020-05-06 DIAGNOSIS — E1169 Type 2 diabetes mellitus with other specified complication: Secondary | ICD-10-CM

## 2020-05-06 DIAGNOSIS — J301 Allergic rhinitis due to pollen: Secondary | ICD-10-CM | POA: Diagnosis not present

## 2020-05-06 LAB — GLUCOSE, POCT (MANUAL RESULT ENTRY): POC Glucose: 104 mg/dl — AB (ref 70–99)

## 2020-05-06 MED ORDER — FLUTICASONE PROPIONATE 50 MCG/ACT NA SUSP: 1.0000 | Freq: Every day | NASAL | 2 refills | Status: DC | PRN

## 2020-05-06 MED FILL — FLUTICASONE PROP 50 MCG SPR: 50 | 30 days supply | Qty: 16 | Fill #0

## 2020-05-06 NOTE — Patient Outreach (Signed)
Care Coordination  05/06/2020  Chris [name] July 02, 1960 [id number]   A second unsuccessful telephone outreach was attempted today. The patient was referred to the case management team for assistance with care management and care coordination. Patient attended hospital follow up earlier today at [facility].  Follow Up Plan: A HIPAA compliant phone message was left for the patient providing contact information and requesting a return call.  The Managed Medicaid care management team will reach out to the patient again over the next 7-14 days.   [name] RN, BSN [place] RN Care Coordinator

## 2020-05-06 NOTE — Telephone Encounter (Signed)
Transition Care Management Follow-up Telephone Call Date of discharge and from where: 05/05/2020, [facility].  Patient has follow up appointment at [facility] today.

## 2020-05-06 NOTE — Assessment & Plan Note (Signed)
I have reviewed his emergency department evaluation.  I have reviewed his medications.  I have gone over his medications with him and the pharmacist.  Continue current medications.  He is getting somewhat better.  He had a discussion of the underlying problem.  He has a nonischemic cardiomyopathy.  I suspect this is somewhat related to cocaine use.  He states he has stayed away from cocaine and does not need help at this time.  BP Readings from Last 3 Encounters:  05/06/20 (!) 152/108  05/05/20 (!) 175/115  02/25/20 (!) 148/98   His blood pressure is elevated today but he has not taken his medication.  He will go back and take his medication as reviewed with him at this visit.  He needs to be seen in the month.

## 2020-05-06 NOTE — Patient Instructions (Signed)
Visit Information  [name]  - as a part of your Medicaid benefit, you are eligible for care management and care coordination services at no cost or copay. I was unable to reach you by phone today but would be happy to help you with your health related needs. Please feel free to call me @ [phone].   A member of the Managed Medicaid care management team will reach out to you again over the next 7-14 days.   Mahathi Pokorney RN, BSN [place]  [facility] RN Care Coordinator   

## 2020-05-06 NOTE — Progress Notes (Signed)
Patient seen in ed with decompensated heart failure on 11/21- he is feeling some better but still has a cough. The cough can be productive of phlegm (thick).  Patient was treated with furosemide.   He states he is an active cocaine user and he notes that cocaine tends to exacerbate his sxs.   Past Medical History:  Diagnosis Date  . CHF (congestive heart failure) (HCC)   . Chronic kidney disease   . COPD (chronic obstructive pulmonary disease) (HCC)   . Coronary artery disease   . Depression   . Diabetes mellitus without complication (HCC)   . GERD (gastroesophageal reflux disease)   . Gout   . Hypertension   . Influenza A with respiratory manifestations   . Mental disorder     Social History   Socioeconomic History  . Marital status: Single    Spouse name: Not on file  . Number of children: Not on file  . Years of education: Not on file  . Highest education level: Not on file  Occupational History  . Not on file  Tobacco Use  . Smoking status: Current Every Day Smoker    Packs/day: 1.00    Years: 20.00    Pack years: 20.00    Types: Cigarettes  . Smokeless tobacco: Never Used  . Tobacco comment: less than 1 pack per day  Vaping Use  . Vaping Use: Never used  Substance and Sexual Activity  . Alcohol use: No  . Drug use: Yes    Frequency: 21.0 times per week    Types: Marijuana, Cocaine    Comment: no longer- Cocaine  . Sexual activity: Not on file  Other Topics Concern  . Not on file  Social History Narrative   ** Merged History Encounter **       Social Determinants of Health   Financial Resource Strain:   . Difficulty of Paying Living Expenses: Not on file  Food Insecurity: Food Insecurity Present  . Worried About Programme researcher, broadcasting/film/video in the Last Year: Often true  . Ran Out of Food in the Last Year: Often true  Transportation Needs: Unmet Transportation Needs  . Lack of Transportation (Medical): Yes  . Lack of Transportation (Non-Medical): Yes  Physical  Activity:   . Days of Exercise per Week: Not on file  . Minutes of Exercise per Session: Not on file  Stress:   . Feeling of Stress : Not on file  Social Connections:   . Frequency of Communication with Friends and Family: Not on file  . Frequency of Social Gatherings with Friends and Family: Not on file  . Attends Religious Services: Not on file  . Active Member of Clubs or Organizations: Not on file  . Attends Banker Meetings: Not on file  . Marital Status: Not on file  Intimate Partner Violence:   . Fear of Current or Ex-Partner: Not on file  . Emotionally Abused: Not on file  . Physically Abused: Not on file  . Sexually Abused: Not on file    Past Surgical History:  Procedure Laterality Date  . ANKLE SURGERY    . CARDIAC CATHETERIZATION    . HERNIA REPAIR     x2  . SHOULDER SURGERY      Family History  Problem Relation Age of Onset  . Heart disease Father   . Diabetes Mother   . HIV Brother   . Healthy Son   . Healthy Daughter     No Known  Allergies  Current Outpatient Medications on File Prior to Visit  Medication Sig Dispense Refill  . albuterol (PROVENTIL) (2.5 MG/3ML) 0.083% nebulizer solution Take 3 mLs by nebulization every 6 (six) hours as needed for shortness of breath. 90 mL 1  . albuterol (VENTOLIN HFA) 108 (90 Base) MCG/ACT inhaler Inhale 2 puffs into the lungs every 6 (six) hours as needed for wheezing or shortness of breath. 8.5 g 2  . allopurinol (ZYLOPRIM) 100 MG tablet Take 2 tablets (200 mg total) by mouth daily. 60 tablet 4  . aspirin 81 MG EC tablet Take 1 tablet (81 mg total) by mouth daily. 30 tablet 2  . atorvastatin (LIPITOR) 40 MG tablet Take 1 tablet (40 mg total) by mouth daily at 6 PM. 30 tablet 2  . blood glucose meter kit and supplies Dispense based on patient and insurance preference. Use up to four times daily as directed. (FOR ICD-10 E10.9, E11.9). 1 each 0  . Blood Glucose Monitoring Suppl (ACCU-CHEK GUIDE) w/Device KIT  USE AS DIRECTED 1 kit 0  . budesonide-formoterol (SYMBICORT) 160-4.5 MCG/ACT inhaler Inhale 2 puffs into the lungs 2 (two) times daily. 1 each 2  . carvedilol (COREG) 25 MG tablet Take 1 tablet (25 mg total) by mouth 2 (two) times daily with a meal. 60 tablet 4  . fluticasone (FLONASE) 50 MCG/ACT nasal spray Place 1 spray into both nostrils daily as needed for allergies or rhinitis. 16 g 2  . glipiZIDE (GLUCOTROL) 5 MG tablet Take 0.5 tablets (2.5 mg total) by mouth 2 (two) times daily before a meal. 30 tablet 4  . isosorbide-hydrALAZINE (BIDIL) 20-37.5 MG tablet Take 1 tablet by mouth 3 (three) times daily. 90 tablet 4  . montelukast (SINGULAIR) 10 MG tablet Take 1 tablet (10 mg total) by mouth at bedtime. 30 tablet 2  . sacubitril-valsartan (ENTRESTO) 24-26 MG Take 1 tablet by mouth 2 (two) times daily. 60 tablet 4  . Blood Glucose Monitoring Suppl (TRUE METRIX METER) w/Device KIT Use as directed 1 kit 0  . diclofenac Sodium (VOLTAREN) 1 % GEL Apply 2 g topically 4 (four) times daily. Use on elbow pain 50 g 3  . furosemide (LASIX) 40 MG tablet Take 1.5 tablets (60 mg total) by mouth daily. 45 tablet 2  . glucose blood (ACCU-CHEK GUIDE) test strip Use as instructed. Check blood glucose by fingerstick once per day. 100 each 12  . glucose blood (TRUE METRIX BLOOD GLUCOSE TEST) test strip Use as instructed 100 each 12  . Lancet Devices (LANCING DEVICE) MISC 1 Device by Does not apply route daily. 1 each 0  . TRUEplus Lancets 28G MISC Use as directed 100 each 4   No current facility-administered medications on file prior to visit.     patient denies chest pain, shortness of breath, orthopnea. Denies lower extremity edema, abdominal pain, change in appetite, change in bowel movements. Patient denies rashes, musculoskeletal complaints. No other specific complaints in a complete review of systems.   BP (!) 152/108   Pulse 93   Resp 16   Wt 256 lb 6.4 oz (116.3 kg)   SpO2 94%   BMI 34.77 kg/m    well-developed well-nourished male in no acute distress. HEENT exam atraumatic, normocephalic, neck supple without jugular venous distention. Chest with bilateral crackles. cardiac exam S1-S2 are regular NO GALLOP.[name] Abdominal exam overweight with bowel sounds, soft and nontender. Extremities no edema. Neurologic exam is alert with a normal gait.   Acute on chronic combined systolic (congestive)  and diastolic (congestive) heart failure ([place]) I have reviewed his emergency department evaluation.  I have reviewed his medications.  I have gone over his medications with him and the pharmacist.  Continue current medications.  He is getting somewhat better.  He had a discussion of the underlying problem.  He has a nonischemic cardiomyopathy.  I suspect this is somewhat related to cocaine use.  He states he has stayed away from cocaine and does not need help at this time.  BP Readings from Last 3 Encounters:  05/06/20 (!) 152/108  05/05/20 (!) 175/115  02/25/20 (!) 148/98   His blood pressure is elevated today but he has not taken his medication.  He will go back and take his medication as reviewed with him at this visit.  He needs to be seen in the month.

## 2020-05-06 NOTE — Telephone Encounter (Signed)
Pt has already seen luke and Dr. Leanne Chang today for hospital f/u

## 2020-05-06 NOTE — Telephone Encounter (Signed)
Copied from [place] [id number]. Topic: General - Inquiry >> May 06, 2020  8:11 AM Lennox Solders wrote: Reason for CRM: Pt was d/c from [facility] yesterday and he has a lot of medications he would like a nurse to help him determine which medications he should be taking. Pt would like to come to office today with all medications. Please advice. Pt has chf

## 2020-05-07 LAB — MICROALBUMIN / CREATININE URINE RATIO
Creatinine, Urine: 214.9 mg/dL
Microalb/Creat Ratio: 1382 mg/g creat — ABNORMAL HIGH (ref 0–29)
Microalbumin, Urine: 2969.9 ug/mL

## 2020-05-12 ENCOUNTER — Other Ambulatory Visit: Payer: Self-pay

## 2020-05-12 NOTE — Patient Instructions (Signed)
Visit Information  Chris [name] was given information about Medicaid Managed Care team care coordination services as a part of their Healthy [name] Medicaid benefit. [name] verbally consented to engagement with the [facility] Managed Care team.   For questions related to your Healthy [facility] health plan, please call: [phone] or visit the homepage here: [email or web address]  If you would like to schedule transportation through your Healthy [facility] plan, please call the following number at least 2 days in advance of your appointment: [phone]   Social Worker will follow up with patient in 30 days.[name], [name], [place]  High Risk Managed Medicaid Team

## 2020-05-12 NOTE — Patient Outreach (Signed)
Care Coordination- Social Work  05/12/2020  [name] [id number] [id number]  Subjective:    Chris [name] is an 59 y.o. year old male who is a primary patient of Chris Pier, MD.    Mr. Parcell was given information about Medicaid Managed Care team care coordination services today. Chris [name] agreed to services and verbal consent obtained  Review of patient status, laboratory and other test data was performed as part of evaluation for provision of services.  SDOH:   SDOH Screenings   Alcohol Screen:   . Last Alcohol Screening Score (AUDIT): Not on file  Depression (PHQ2-9): Medium Risk  . PHQ-2 Score: 14  Financial Resource Strain:   . Difficulty of Paying Living Expenses: Not on file  Food Insecurity: Food Insecurity Present  . Worried About Charity fundraiser in the Last Year: Often true  . Ran Out of Food in the Last Year: Often true  Housing:   . Last Housing Risk Score: Not on file  Physical Activity:   . Days of Exercise per Week: Not on file  . Minutes of Exercise per Session: Not on file  Social Connections:   . Frequency of Communication with Friends and Family: Not on file  . Frequency of Social Gatherings with Friends and Family: Not on file  . Attends Religious Services: Not on file  . Active Member of Clubs or Organizations: Not on file  . Attends Archivist Meetings: Not on file  . Marital Status: Not on file  Stress:   . Feeling of Stress : Not on file  Tobacco Use: High Risk  . Smoking Tobacco Use: Current Every Day Smoker  . Smokeless Tobacco Use: Never Used  Transportation Needs: Unmet Transportation Needs  . Lack of Transportation (Medical): Yes  . Lack of Transportation (Non-Medical): Yes     Objective:    Medications:  Medications Reviewed Today    Reviewed by [name], MD (Physician) on 05/06/20 at 1106  Med List Status: <None>  Medication Order Taking? Sig Documenting Provider Last Dose Status  Informant  albuterol (PROVENTIL) (2.5 MG/3ML) 0.083% nebulizer solution [id number] Yes Take 3 mLs by nebulization every 6 (six) hours as needed for shortness of breath. Chris Pier, MD Taking Active            Med Note Chris [name] May 04, 2020  3:59 PM) LF 04/18/2020 [facility] external [facility] records (Dr [name])  albuterol (VENTOLIN HFA) 108 [phone] Base) MCG/ACT inhaler [id number] Yes Inhale 2 puffs into the lungs every 6 (six) hours as needed for wheezing or shortness of breath. Chris Pier, MD Taking Active            Med Note Chris [name] May 04, 2020  3:59 PM) LF 04/18/2020 [facility] external [facility] records (Dr Brantley [name])  allopurinol (ZYLOPRIM) 100 MG tablet [id number] Yes Take 2 tablets (200 mg total) by mouth daily. Chris Pier, MD Taking Active            Med Note Chris [name] May 04, 2020  4:00 PM) #60/30 days filled 04/18/2020 [facility] external [facility] records (Dr [name])  aspirin 81 MG EC tablet [id number] Yes Take 1 tablet (81 mg total) by mouth daily. Chris Pier, MD Taking Active   atorvastatin (LIPITOR) 40 MG tablet [id number] Yes Take 1 tablet (40 mg total)  by mouth daily at 6 PM. Chris Pier, MD Taking Active            Med Note Chris [name] May 04, 2020  4:00 PM) #30 filled 04/18/2020 [facility] records (Dr [name])  blood glucose meter kit and supplies [id number] Yes Dispense based on patient and insurance preference. Use up to four times daily as directed. (FOR ICD-10 E10.9, E11.9). Chris Pier, MD Taking Active   Blood Glucose Monitoring Suppl (ACCU-CHEK GUIDE) w/Device [name] [id number] Yes USE AS DIRECTED Chris Pier, MD Taking Active   Blood Glucose Monitoring Suppl (TRUE METRIX METER) w/Device KIT [id number]  Use as directed Chris Pier, MD  Active   budesonide-formoterol  [facility]) 160-4.5 MCG/ACT inhaler [id number] Yes Inhale 2 puffs into the lungs 2 (two) times daily. Chris Pier, MD Taking Active            Med Note Chris [name] May 04, 2020  4:00 PM) LF 04/18/2020 [facility] (Dr [name])  carvedilol (COREG) 25 MG tablet [id number] Yes Take 1 tablet (25 mg total) by mouth 2 (two) times daily with a meal. Chris Pier, MD Taking Active            Med Note Chris [name] May 04, 2020  4:00 PM) #60/30 days filled 04/18/2020 [facility] records (Dr Brantley [name])  colchicine 0.6 MG tablet [id number] Yes Take half tablet by mouth every other day.  Patient taking differently: Take 0.3 mg by mouth every other day.    Chris Pier, MD Taking Active            Med Note Chris [name] May 04, 2020  4:04 PM) #8/30 days 03/06/2020/ #90/90 days filled 11/07/2019 [facility] records (Dr Brantley [name])  diclofenac Sodium (VOLTAREN) 1 % GEL [id number]  Apply 2 g topically 4 (four) times daily. Use on elbow pain Chris Anis, MD  Active   fluticasone [facility]) 50 MCG/ACT nasal spray [id number] Yes Place 1 spray into both nostrils daily as needed for allergies or rhinitis. Chris Pier, MD Taking Active            Med Note Chris [name] May 04, 2020  4:04 PM) LF 03/06/2020 [facility] records (Dr Brantley [name])  furosemide (LASIX) 40 MG tablet [id number]  Take 1.5 tablets (60 mg total) by mouth daily. Chris Anis, MD  Active   glipiZIDE (GLUCOTROL) 5 MG tablet [id number] Yes Take 0.5 tablets (2.5 mg total) by mouth 2 (two) times daily before a meal. Chris Pier, MD Taking Active            Med Note Chris [name] May 04, 2020  4:01 PM) #30/30 days filled 04/18/2020 [facility] (Dr [name])  glucose blood (ACCU-CHEK  GUIDE) test strip [id number]  Use as instructed. Check blood glucose by fingerstick once per day. Chris Pier, MD  Active   glucose blood (TRUE METRIX BLOOD GLUCOSE TEST) test strip [id number]  Use as instructed Chris Pier, MD  Active   isosorbide-hydrALAZINE (BIDIL) 20-37.5 MG tablet [id number] Yes Take 1 tablet by mouth 3 (three) times daily. Chris Pier, MD Taking Active  Med Note Chris [name] May 04, 2020  4:02 PM) #90/30 days filled 04/18/2020 [facility] (Dr Brantley [name])  [employer] (LANCING DEVICE) [place] [id number]  1 Device by Does not apply route daily. Regalado, Belkys A, MD  Active   montelukast (SINGULAIR) 10 MG tablet [id number] Yes Take 1 tablet (10 mg total) by mouth at bedtime. Chris Pier, MD Taking Active            Med Note Chris [name] May 04, 2020  4:02 PM) #30 filled 04/18/2020 [facility] (Dr [name])  sacubitril-valsartan (ENTRESTO) 24-26 [place] [id number] Yes Take 1 tablet by mouth 2 (two) times daily. Chris Pier, MD Taking Active            Med Note Chris [name] May 04, 2020  4:03 PM) #60/30 days filled 04/18/2020 [facility] per external pharmacy records (Dr [name])  [place] Lanc[employer] 28G [place] [id number]  Use as directed Chris Pier, MD  Active           Fall/Depression Screening:  Fall Risk  05/06/2020 04/17/2020 09/11/2019  Falls in the past year? 0 0 0  Number falls in past yr: 0 - 0  Injury with Fall? 0 - -   PHQ 2/9 Scores 11/01/2019 09/11/2019 07/09/2019 04/04/2019 11/30/2018 07/05/2017 05/13/2017  PHQ - 2 Score 5 1 2 6  0 3 5  PHQ- 9 Score 14 4 4 21  - 9 13    Assessment:    Plan: Patient will start to look for new housing options. [name] will assist patient in housing options.

## 2020-05-13 ENCOUNTER — Other Ambulatory Visit: Payer: Self-pay

## 2020-05-13 ENCOUNTER — Other Ambulatory Visit: Payer: Self-pay | Admitting: *Deleted
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
## 2020-05-14 ENCOUNTER — Emergency Department (HOSPITAL_COMMUNITY)
Admission: EM | Admit: 2020-05-14 | Discharge: 2020-05-15 | Disposition: A | Discharge status: Home / Self Care | Payer: Medicaid Other | Attending: Emergency Medicine | Admitting: Emergency Medicine

## 2020-05-14 ENCOUNTER — Other Ambulatory Visit: Payer: Self-pay

## 2020-05-14 DIAGNOSIS — Z7984 Long term (current) use of oral hypoglycemic drugs: Secondary | ICD-10-CM | POA: Diagnosis not present

## 2020-05-14 DIAGNOSIS — I130 Hypertensive heart and chronic kidney disease with heart failure and stage 1 through stage 4 chronic kidney disease, or unspecified chronic kidney disease: Secondary | ICD-10-CM | POA: Diagnosis not present

## 2020-05-14 DIAGNOSIS — J441 Chronic obstructive pulmonary disease with (acute) exacerbation: Secondary | ICD-10-CM | POA: Diagnosis not present

## 2020-05-14 DIAGNOSIS — R079 Chest pain, unspecified: Secondary | ICD-10-CM | POA: Diagnosis not present

## 2020-05-14 DIAGNOSIS — F17210 Nicotine dependence, cigarettes, uncomplicated: Secondary | ICD-10-CM | POA: Insufficient documentation

## 2020-05-14 DIAGNOSIS — R0602 Shortness of breath: Secondary | ICD-10-CM

## 2020-05-14 DIAGNOSIS — J811 Chronic pulmonary edema: Secondary | ICD-10-CM | POA: Diagnosis not present

## 2020-05-14 DIAGNOSIS — R062 Wheezing: Secondary | ICD-10-CM | POA: Diagnosis not present

## 2020-05-14 DIAGNOSIS — I2510 Atherosclerotic heart disease of native coronary artery without angina pectoris: Secondary | ICD-10-CM | POA: Diagnosis not present

## 2020-05-14 DIAGNOSIS — Z20822 Contact with and (suspected) exposure to covid-19: Secondary | ICD-10-CM | POA: Insufficient documentation

## 2020-05-14 DIAGNOSIS — R0600 Dyspnea, unspecified: Secondary | ICD-10-CM | POA: Diagnosis not present

## 2020-05-14 DIAGNOSIS — N183 Chronic kidney disease, stage 3 unspecified: Secondary | ICD-10-CM | POA: Diagnosis not present

## 2020-05-14 DIAGNOSIS — R0789 Other chest pain: Secondary | ICD-10-CM | POA: Diagnosis not present

## 2020-05-14 DIAGNOSIS — E1122 Type 2 diabetes mellitus with diabetic chronic kidney disease: Secondary | ICD-10-CM | POA: Diagnosis not present

## 2020-05-14 DIAGNOSIS — Z7982 Long term (current) use of aspirin: Secondary | ICD-10-CM | POA: Insufficient documentation

## 2020-05-14 DIAGNOSIS — Z7951 Long term (current) use of inhaled steroids: Secondary | ICD-10-CM | POA: Insufficient documentation

## 2020-05-14 DIAGNOSIS — Z79899 Other long term (current) drug therapy: Secondary | ICD-10-CM | POA: Diagnosis not present

## 2020-05-14 DIAGNOSIS — I5043 Acute on chronic combined systolic (congestive) and diastolic (congestive) heart failure: Secondary | ICD-10-CM | POA: Insufficient documentation

## 2020-05-14 DIAGNOSIS — I10 Essential (primary) hypertension: Secondary | ICD-10-CM | POA: Diagnosis not present

## 2020-05-14 DIAGNOSIS — R1013 Epigastric pain: Secondary | ICD-10-CM | POA: Insufficient documentation

## 2020-05-14 DIAGNOSIS — R059 Cough, unspecified: Secondary | ICD-10-CM | POA: Diagnosis not present

## 2020-05-14 NOTE — ED Triage Notes (Signed)
[facility] and shob since this morning. 324 ASA and 0.4 nitro given without relief. Pt unable to rate pain on a scale of 1-10 and also unable to describe the quality of the pain.

## 2020-05-15 ENCOUNTER — Other Ambulatory Visit: Payer: Self-pay

## 2020-05-15 ENCOUNTER — Emergency Department (HOSPITAL_COMMUNITY): Payer: Medicaid Other

## 2020-05-15 DIAGNOSIS — J811 Chronic pulmonary edema: Secondary | ICD-10-CM | POA: Diagnosis not present

## 2020-05-15 DIAGNOSIS — R0600 Dyspnea, unspecified: Secondary | ICD-10-CM | POA: Diagnosis not present

## 2020-05-15 DIAGNOSIS — R079 Chest pain, unspecified: Secondary | ICD-10-CM | POA: Diagnosis not present

## 2020-05-15 LAB — CBC WITH DIFFERENTIAL/PLATELET
Abs Immature Granulocytes: 0.04 10*3/uL (ref 0.00–0.07)
Basophils Absolute: 0.1 10*3/uL (ref 0.0–0.1)
Basophils Relative: 1 %
Eosinophils Absolute: 0.3 10*3/uL (ref 0.0–0.5)
Eosinophils Relative: 3 %
HCT: 50.4 % (ref 39.0–52.0)
Hemoglobin: 15.8 g/dL (ref 13.0–17.0)
Immature Granulocytes: 1 %
Lymphocytes Relative: 32 %
Lymphs Abs: 2.8 10*3/uL (ref 0.7–4.0)
MCH: 30.5 pg (ref 26.0–34.0)
MCHC: 31.3 g/dL (ref 30.0–36.0)
MCV: 97.3 fL (ref 80.0–100.0)
Monocytes Absolute: 0.8 10*3/uL (ref 0.1–1.0)
Monocytes Relative: 10 %
Neutro Abs: 4.6 10*3/uL (ref 1.7–7.7)
Neutrophils Relative %: 53 %
Platelets: 226 10*3/uL (ref 150–400)
RBC: 5.18 MIL/uL (ref 4.22–5.81)
RDW: 15.1 % (ref 11.5–15.5)
WBC: 8.6 10*3/uL (ref 4.0–10.5)
nRBC: 0 % (ref 0.0–0.2)

## 2020-05-15 LAB — COMPREHENSIVE METABOLIC PANEL
ALT: 18 U/L (ref 0–44)
AST: 16 U/L (ref 15–41)
Albumin: 3.2 g/dL — ABNORMAL LOW (ref 3.5–5.0)
Alkaline Phosphatase: 63 U/L (ref 38–126)
Anion gap: 11 (ref 5–15)
BUN: 18 mg/dL (ref 6–20)
CO2: 27 mmol/L (ref 22–32)
Calcium: 9.6 mg/dL (ref 8.9–10.3)
Chloride: 104 mmol/L (ref 98–111)
Creatinine, Ser: 1.89 mg/dL — ABNORMAL HIGH (ref 0.61–1.24)
GFR, Estimated: 41 mL/min — ABNORMAL LOW (ref >60)
Glucose, Bld: 93 mg/dL (ref 70–99)
Potassium: 4.3 mmol/L (ref 3.5–5.1)
Sodium: 142 mmol/L (ref 135–145)
Total Bilirubin: 0.8 mg/dL (ref 0.3–1.2)
Total Protein: 6.3 g/dL — ABNORMAL LOW (ref 6.5–8.1)

## 2020-05-15 LAB — RESP PANEL BY RT-PCR (FLU A&B, COVID) ARPGX2
Influenza A by PCR: NEGATIVE
Influenza B by PCR: NEGATIVE
SARS Coronavirus 2 by RT PCR: NEGATIVE

## 2020-05-15 LAB — TROPONIN I (HIGH SENSITIVITY)
Troponin I (High Sensitivity): 88 ng/L — ABNORMAL HIGH (ref <18)
Troponin I (High Sensitivity): 80 ng/L — ABNORMAL HIGH (ref <18)

## 2020-05-15 LAB — LIPASE, BLOOD: Lipase: 19 U/L (ref 11–51)

## 2020-05-15 LAB — BRAIN NATRIURETIC PEPTIDE: B Natriuretic Peptide: 1756.8 pg/mL — ABNORMAL HIGH (ref 0.0–100.0)

## 2020-05-15 MED ORDER — PREDNISONE 50 MG PO TABS: 50.0000 mg | ORAL_TABLET | Freq: Every day | ORAL | 0 refills | Status: DC

## 2020-05-15 MED ORDER — ISOSORB DINITRATE-HYDRALAZINE 20-37.5 MG PO TABS
1.0000 | ORAL_TABLET | Freq: Once | ORAL | Status: AC
  Administered 2020-05-15: 1 via ORAL
  Filled 2020-05-15: qty 1

## 2020-05-15 MED ORDER — PREDNISONE 20 MG PO TABS
60.0000 mg | ORAL_TABLET | Freq: Once | ORAL | Status: AC
  Administered 2020-05-15: 60 mg via ORAL
  Filled 2020-05-15: qty 3

## 2020-05-15 MED ORDER — PREDNISONE 50 MG PO TABS: 50.0000 mg | ORAL_TABLET | Freq: Every day | ORAL | 0 refills | Status: AC

## 2020-05-15 MED ORDER — SACUBITRIL-VALSARTAN 24-26 MG PO TABS
1.0000 | ORAL_TABLET | Freq: Two times a day (BID) | ORAL | Status: DC
  Administered 2020-05-15: 1 via ORAL
  Filled 2020-05-15 (×2): qty 1

## 2020-05-15 MED ORDER — CARVEDILOL 12.5 MG PO TABS
25.0000 mg | ORAL_TABLET | Freq: Once | ORAL | Status: AC
  Administered 2020-05-15: 25 mg via ORAL
  Filled 2020-05-15: qty 2

## 2020-05-15 MED ORDER — ALBUTEROL SULFATE HFA 108 (90 BASE) MCG/ACT IN AERS
6.0000 | INHALATION_SPRAY | Freq: Once | RESPIRATORY_TRACT | Status: AC
  Administered 2020-05-15: 6 via RESPIRATORY_TRACT
  Filled 2020-05-15: qty 6.7

## 2020-05-15 MED FILL — predniSONE 10 MG TABS: 10 | 4 days supply | Qty: 20 | Fill #0

## 2020-05-15 NOTE — ED Provider Notes (Signed)
[name] EMERGENCY DEPARTMENT Provider Note   CSN: [id number] Arrival date & time: 05/14/20  1709     History Chief Complaint  Patient presents with   Chest Pain   Shortness of Breath    BENJIMIN [name] is a 59 y.o. male.  HPI   59 year old male with a history of CHF, CKD, COPD, CAD, depression, diabetes, GERD, gout, hypertension, who presents to the emergency department today for evaluation of chest pain shortness of breath.  States he was recently admitted to the hospital and since he has been discharged she has had chest pain and shortness of breath.  It is relatively unchanged today.  Pain is located to the bilateral aspects of his chest and he also has some epigastric discomfort as well.  He denies any nausea or vomiting.  Denies any constipation, diarrhea or GU symptoms.  States he has had an intermittent nonproductive cough.  He denies any increased bilateral lower extremity edema.  Past Medical History:  Diagnosis Date   CHF (congestive heart failure) (HCC)    Chronic kidney disease    COPD (chronic obstructive pulmonary disease) (HCC)    Coronary artery disease    Depression    Diabetes mellitus without complication ([place])    GERD (gastroesophageal reflux disease)    Gout    Hypertension    Influenza A with respiratory manifestations    Mental disorder     Patient Active Problem List   Diagnosis Date Noted   Influenza vaccine refused 05/06/2020   Acute on chronic combined systolic (congestive) and diastolic (congestive) heart failure ([place]) 05/05/2020   Acute decompensated heart failure ([place]) 05/04/2020   Illiteracy 05/04/2020   Chronic respiratory failure with hypoxia, on home oxygen therapy ([place]) 12/28/2019   Type 2 diabetes mellitus with stage 3 chronic kidney disease ([place]) 12/25/2019   Acute and chronic respiratory failure (acute-on-chronic) ([place]) 12/25/2019   Acute on chronic combined systolic and diastolic CHF  (congestive heart failure) ([place]) 10/26/2019   Elevated troponin I level 10/26/2019   Acute on chronic diastolic (congestive) heart failure ([place]) 10/26/2019   History of gout 02/01/2019   Seasonal allergic rhinitis due to pollen 02/01/2019   Tobacco dependence 11/30/2018   Microscopic hematuria 11/30/2018   Depression 11/30/2018   Difficulty controlling anger 11/30/2018   COPD (chronic obstructive pulmonary disease) (HCC)    CKD (chronic kidney disease) stage 3, GFR 30-59 ml/min ([place]) 08/10/2018   Recurrent epistaxis 04/21/2018   Mixed hyperlipidemia 07/28/2017   Essential hypertension 83/38/2505   Chronic systolic heart failure ([place]) 10/25/2014   Cocaine abuse ([place]) 02/20/2013   Cannabis abuse 02/20/2013   Back pain, chronic 02/20/2013    Past Surgical History:  Procedure Laterality Date   ANKLE SURGERY     CARDIAC CATHETERIZATION     HERNIA REPAIR     x2   SHOULDER SURGERY         Family History  Problem Relation Age of Onset   Heart disease Father    Diabetes Mother    HIV Brother    Healthy Son    Healthy Daughter     Social History   Tobacco Use   Smoking status: Current Every Day Smoker    Packs/day: 1.00    Years: 20.00    Pack years: 20.00    Types: Cigarettes   Smokeless tobacco: Never Used   Tobacco comment: less than 1 pack per day  Vaping Use   Vaping Use: Never used  Substance Use Topics  Alcohol use: No   Drug use: Yes    Frequency: 21.0 times per week    Types: Marijuana, Cocaine    Comment: no longer- Cocaine    Home Medications Prior to Admission medications   Medication Sig Start Date End Date Taking? Authorizing Provider  albuterol (PROVENTIL) (2.5 MG/3ML) 0.083% nebulizer solution Take 3 mLs by nebulization every 6 (six) hours as needed for shortness of breath. 04/17/20   [name], MD  albuterol (VENTOLIN HFA) 108 (90 Base) MCG/ACT inhaler Inhale 2 puffs into the lungs every 6 (six) hours as  needed for wheezing or shortness of breath. 04/17/20   [name], MD  allopurinol (ZYLOPRIM) 100 MG tablet Take 2 tablets (200 mg total) by mouth daily. 04/17/20   [name], MD  aspirin 81 MG EC tablet Take 1 tablet (81 mg total) by mouth daily. 03/06/20   [name], MD  atorvastatin (LIPITOR) 40 MG tablet Take 1 tablet (40 mg total) by mouth daily at 6 PM. 04/17/20   [name], MD  blood glucose meter kit and supplies Dispense based on patient and insurance preference. Use up to four times daily as directed. (FOR ICD-10 E10.9, E11.9). 04/17/20   [name], MD  Blood Glucose Monitoring Suppl (ACCU-CHEK GUIDE) w/Device KIT USE AS DIRECTED 04/17/20   [name], MD  Blood Glucose Monitoring Suppl (TRUE METRIX METER) w/Device KIT Use as directed 04/17/20   [name], MD  budesonide-formoterol [facility]) 160-4.5 MCG/ACT inhaler Inhale 2 puffs into the lungs 2 (two) times daily. 04/17/20   [name], MD  carvedilol (COREG) 25 MG tablet Take 1 tablet (25 mg total) by mouth 2 (two) times daily with a meal. 04/17/20   [name], MD  diclofenac Sodium (VOLTAREN) 1 % GEL Apply 2 g topically 4 (four) times daily. Use on elbow pain 05/05/20   [name], MD  fluticasone [facility]) 50 MCG/ACT nasal spray Place 1 spray into both nostrils daily as needed for allergies or rhinitis. 05/06/20   Swords, Darrick Penna, MD  furosemide (LASIX) 40 MG tablet Take 1.5 tablets (60 mg total) by mouth daily. 05/05/20   [name], MD  glipiZIDE (GLUCOTROL) 5 MG tablet Take 0.5 tablets (2.5 mg total) by mouth 2 (two) times daily before a meal. 04/17/20   [name], MD  glucose blood (ACCU-CHEK GUIDE) test strip Use as instructed. Check blood glucose by fingerstick once per day. 03/06/20   [name], MD  glucose blood (TRUE METRIX BLOOD GLUCOSE TEST) test strip Use as instructed 12/28/19   [name], MD  isosorbide-hydrALAZINE (BIDIL)  20-37.5 MG tablet Take 1 tablet by mouth 3 (three) times daily. 04/17/20   [name], MD  Lancet Devices (LANCING DEVICE) MISC 1 Device by Does not apply route daily. 12/27/19   Regalado, Belkys A, MD  montelukast (SINGULAIR) 10 MG tablet Take 1 tablet (10 mg total) by mouth at bedtime. 04/17/20 07/16/20  [name], MD  predniSONE (DELTASONE) 50 MG tablet Take 1 tablet (50 mg total) by mouth daily for 4 days. 05/15/20 05/19/20  Christmas Faraci S, PA-C  sacubitril-valsartan (ENTRESTO) 24-26 MG Take 1 tablet by mouth 2 (two) times daily. 04/17/20   [name], MD  TRUEplus Lancets 28G MISC Use as directed 12/28/19   [name], MD    Allergies    Patient has no known allergies.  Review of Systems  Review of Systems  Constitutional: Negative for chills and fever.  HENT: Negative for ear pain and sore throat.   Eyes: Negative for pain and visual disturbance.  Respiratory: Positive for cough and shortness of breath.   Cardiovascular: Positive for chest pain.  Gastrointestinal: Positive for abdominal pain. Negative for constipation, diarrhea, nausea and vomiting.  Genitourinary: Negative for dysuria and hematuria.  Musculoskeletal: Negative for back pain.  Skin: Negative for color change and rash.  Neurological: Negative for seizures and syncope.  All other systems reviewed and are negative.   Physical Exam Updated Vital Signs BP (!) 175/118 (BP Location: Left Arm)    Pulse 73    Temp 98.2 F (36.8 C) (Oral)    Resp 17    SpO2 96%   Physical Exam Vitals and nursing note reviewed.  Constitutional:      Appearance: He is well-developed.  HENT:     Head: Normocephalic and atraumatic.  Eyes:     Conjunctiva/sclera: Conjunctivae normal.  Cardiovascular:     Rate and Rhythm: Normal rate and regular rhythm.     Heart sounds: Normal heart sounds. No murmur heard.   Pulmonary:     Effort: Pulmonary effort is normal.     Breath sounds: Wheezing present. No  rales.  Chest:     Chest wall: No tenderness.  Abdominal:     General: Bowel sounds are normal.     Palpations: Abdomen is soft.     Tenderness: There is abdominal tenderness (epigastric and ruq). There is no guarding or rebound.  Musculoskeletal:        General: No tenderness.     Cervical back: Neck supple.     Right lower leg: No edema.     Left lower leg: No edema.  Skin:    General: Skin is warm and dry.  Neurological:     Mental Status: He is alert.     ED Results / Procedures / Treatments   Labs (all labs ordered are listed, but only abnormal results are displayed) Labs Reviewed  COMPREHENSIVE METABOLIC PANEL - Abnormal; Notable for the following components:      Result Value   Creatinine, Ser 1.89 (*)    Total Protein 6.3 (*)    Albumin 3.2 (*)    GFR, Estimated 41 (*)    All other components within normal limits  BRAIN NATRIURETIC PEPTIDE - Abnormal; Notable for the following components:   B Natriuretic Peptide 1,756.8 (*)    All other components within normal limits  TROPONIN I (HIGH SENSITIVITY) - Abnormal; Notable for the following components:   Troponin I (High Sensitivity) 88 (*)    All other components within normal limits  TROPONIN I (HIGH SENSITIVITY) - Abnormal; Notable for the following components:   Troponin I (High Sensitivity) 80 (*)    All other components within normal limits  RESP PANEL BY RT-PCR (FLU A&B, COVID) ARPGX2  CBC WITH DIFFERENTIAL/PLATELET  LIPASE, BLOOD    EKG EKG Interpretation  Date/Time:  Thursday May 15 2020 04:56:33 EST Ventricular Rate:  86 PR Interval:  132 QRS Duration: 102 QT Interval:  432 QTC Calculation: 516 R Axis:   -48 Text Interpretation: Normal sinus rhythm Possible Left atrial enlargement Incomplete right bundle branch block Left anterior fascicular block Left ventricular hypertrophy ( R in aVL , Cornell product ) Prolonged QT Abnormal ECG No acute changes Confirmed by Addison Lank [phone]) on  05/15/2020 5:31:53 AM   Radiology DG Chest 2 View  Result Date:  05/15/2020 CLINICAL DATA:  Dyspnea, chest pain EXAM: CHEST - 2 VIEW COMPARISON:  None. FINDINGS: The lungs are symmetrically well expanded. Central pulmonary vascular congestion and pulmonary vascular redistribution of the lung apices is again seen in keeping with changes of mild volume overload or early cardiogenic failure. No overt pulmonary edema identified. No pneumothorax or pleural effusion. Cardiac size has improved in the interval and is at the upper limits of normal. No acute bone abnormality. IMPRESSION: Improved cardiac size. Persistent pulmonary vascular congestion without overt pulmonary edema. Electronically Signed   By: Fidela Salisbury MD   On: 05/15/2020 04:35    Procedures Procedures (including critical care time)  Medications Ordered in ED Medications  sacubitril-valsartan (ENTRESTO) 24-26 mg per tablet (1 tablet Oral Given 05/15/20 0405)  albuterol (VENTOLIN HFA) 108 (90 Base) MCG/ACT inhaler 6 puff (6 puffs Inhalation Given 05/15/20 0406)  predniSONE (DELTASONE) tablet 60 mg (60 mg Oral Given 05/15/20 0406)  carvedilol (COREG) tablet 25 mg (25 mg Oral Given 05/15/20 0406)  isosorbide-hydrALAZINE (BIDIL) 20-37.5 MG per tablet 1 tablet (1 tablet Oral Given 05/15/20 0405)    ED Course  I have reviewed the triage vital signs and the nursing notes.  Pertinent labs & imaging results that were available during my care of the patient were reviewed by me and considered in my medical decision making (see chart for details).    MDM Rules/Calculators/A&P                          59 year old male presenting for evaluation of chest pain and shortness of breath ongoing for 2 weeks..  No bilateral lower extremity edema.  Reviewed/interpreted lab CBC unremarkable  CMP with chronically elevated cr, otherwise reassuring Lipase wnl Troponin elevated but flat, suspect chronic elevation is secondary to ckd and demand. Doubt  acs. BNP elevated but improved from prior  EKG - Normal sinus rhythm Possible Left atrial enlargement Incomplete right bundle branch block Left anterior fascicular block Left ventricular hypertrophy ( R in aVL , Cornell product ) Prolonged QT Abnormal ECG No acute changes  Chest x-ray reviewed/interpreted  Patient was given his home blood pressure medicines, prednisone and albuterol.  On reassessment pt resting comfortably in bed and is sleeping in no distress. His workup today is reassuring. He does not appear to be grossly fluid overloaded so I have low suspicion for acute heart failure exacerbation. He did have some increased wheezing on exam and will therefore be treated for a COPD exacerbation. Advised close f/u and strict return precautions. He voices understanding of the plan and reasons to return. All questions answered, pt stable for discharge.   Final Clinical Impression(s) / ED Diagnoses Final diagnoses:  Atypical chest pain  COPD exacerbation ([place])    Rx / DC Orders ED Discharge Orders         Ordered    predniSONE (DELTASONE) 50 MG tablet  Daily,   Status:  Discontinued        05/15/20 0613    predniSONE (DELTASONE) 50 MG tablet  Daily        05/15/20 0627           Rodney Booze, PA-C 05/15/20 3086    Fatima Blank, MD 05/15/20 [phone]

## 2020-05-15 NOTE — Patient Outreach (Signed)
Care Coordination  05/15/2020  Chris [name] [birth year] [id number]  An unsuccessful telephone outreach was attempted today. The patient was referred to the case management team for assistance with care management and care coordination.   Follow Up Plan: The Managed Medicaid care management team will reach out to the patient again over the next 7-14 days.   Netta Neat, [name], MSW, LCSW Social Work Case Freight forwarder - [place]  Direct [place]: [phone]

## 2020-05-15 NOTE — Discharge Instructions (Signed)
Take prednisone as directed.   Please follow up with your primary care provider within 5-7 days for re-evaluation of your symptoms. If you do not have a primary care provider, information for a healthcare clinic has been provided for you to make arrangements for follow up care. Please return to the emergency department for any new or worsening symptoms.  

## 2020-05-15 NOTE — Patient Instructions (Signed)
Visit Information  [name]  - as a part of your Medicaid benefit, you are eligible for care management and care coordination services at no cost or copay. I was unable to reach you by phone today but would be happy to help you with your health related needs. Please feel free to call me @ [phone].   A member of the Managed Medicaid care management team will reach out to you again over the next 7 days.   Netta Neat, [name], MSW, LCSW Social Work Case Freight forwarder - [place]  Direct [place]: [phone]

## 2020-05-16 ENCOUNTER — Other Ambulatory Visit: Payer: Self-pay

## 2020-05-16 NOTE — Patient Outreach (Signed)
Care Coordination  05/16/2020  SADAO WEYER 31-Mar-1961 [id number]   Medicaid Managed Care   Unsuccessful Outreach Note  05/16/2020 Name: AZIAH BROSTROM MRN: [id number] DOB: [birth year]  Referred by: [name], MD Reason for referral : High Risk Managed Medicaid (HR MM Unsuccessful Telephone Outreach)   An unsuccessful telephone outreach was attempted today. The patient was referred to the case management team for assistance with care management and care coordination.   Follow Up Plan: The Managed Medicaid care management team will reach out to the patient again over the next 14 days.   [name], [name], [place]  High Risk Managed Medicaid Team

## 2020-05-16 NOTE — Patient Instructions (Signed)
Visit Information  Mr. SAKSHAM AKKERMAN  - as a part of your Medicaid benefit, you are eligible for care management and care coordination services at no cost or copay. I was unable to reach you by phone today but would be happy to help you with your health related needs. Please feel free to call me @ [phone].   A member of the Managed Medicaid care management team will reach out to you again over the next 14 days.   [name], [name], [place]  High Risk Managed Medicaid Team

## 2020-05-19 ENCOUNTER — Telehealth: Payer: Self-pay | Admitting: Internal Medicine

## 2020-05-19 NOTE — Telephone Encounter (Signed)
I reached out to [place] today to reschedule his appt with the MM LCSW and to schedule him his initial visit with the MM Pharmacist. I left a message with my name and direct number so he can call me back.

## 2020-05-26 ENCOUNTER — Telehealth: Payer: Self-pay | Admitting: Internal Medicine

## 2020-05-26 NOTE — Telephone Encounter (Signed)
2nd attempt to reach Chris [name] to reschedule his appt with Netta Neat and with the Managed Medicaid pharmacist. I left my name and number for him to return my call.

## 2020-05-27 ENCOUNTER — Inpatient Hospital Stay: Payer: Medicaid Other | Admitting: Family

## 2020-06-03 ENCOUNTER — Telehealth: Payer: Self-pay | Admitting: Internal Medicine

## 2020-06-03 ENCOUNTER — Ambulatory Visit: Payer: Medicaid Other | Admitting: Internal Medicine

## 2020-06-03 NOTE — Telephone Encounter (Signed)
Today was my third attempt to reach Chris [name] to get him scheduled for a phone visit with the Managed Medicaid Pharmacist. I left my name and number for the patient to call me back.

## 2020-06-05 ENCOUNTER — Telehealth: Payer: Self-pay | Admitting: Internal Medicine

## 2020-06-05 ENCOUNTER — Other Ambulatory Visit: Payer: Self-pay

## 2020-06-05 NOTE — Patient Outreach (Signed)
Care Coordination- Social Work  06/05/2020  Chris [name] 04-09-1961 [id number]  Subjective:    Chris [name] is an 59 y.o. year old male who is a primary patient of [name], MD.    Chris [name] was given information about Medicaid Managed Care team care coordination services today. [name] agreed to services and verbal consent obtained  Review of patient status, laboratory and other test data was performed as part of evaluation for provision of services.  SDOH:   SDOH Screenings   Alcohol Screen: Not on file  Depression (PHQ2-9): Medium Risk  . PHQ-2 Score: 14  Financial Resource Strain: Not on file  Food Insecurity: Food Insecurity Present  . Worried About Charity fundraiser in the Last Year: Often true  . Ran Out of Food in the Last Year: Often true  Housing: Not on file  Physical Activity: Not on file  Social Connections: Not on file  Stress: Not on file  Tobacco Use: High Risk  . Smoking Tobacco Use: Current Every Day Smoker  . Smokeless Tobacco Use: Never Used  Transportation Needs: Unmet Transportation Needs  . Lack of Transportation (Medical): Yes  . Lack of Transportation (Non-Medical): Yes     Objective:    Medications:  Medications Reviewed Today    Reviewed by [name], RN (Registered Nurse) on 05/13/20 at [place] List Status: <None>  Medication Order Taking? Sig Documenting Provider Last Dose Status Informant  albuterol (PROVENTIL) (2.5 MG/3ML) 0.083% nebulizer solution [id number] Yes Take 3 mLs by nebulization every 6 (six) hours as needed for shortness of breath. [name], MD Taking Active            Med Note [name] May 04, 2020  3:59 PM) LF 04/18/2020 [facility] per external pharmacy records (Dr First)  albuterol (VENTOLIN HFA) 108 [phone] Base) MCG/ACT inhaler [id number] Yes Inhale 2 puffs into the lungs every 6 (six) hours as needed for wheezing or shortness of breath. [name], MD Taking Active            Med Note [name] May 04, 2020  3:59 PM) LF 04/18/2020 [facility] per external pharmacy records (Dr Brantley Stage)  allopurinol (ZYLOPRIM) 100 MG tablet [id number] Yes Take 2 tablets (200 mg total) by mouth daily. [name], MD Taking Active            Med Note [name] May 04, 2020  4:00 PM) #60/30 days filled 04/18/2020 [facility] per external pharmacy records (Dr First)  aspirin 81 MG EC tablet [id number] Yes Take 1 tablet (81 mg total) by mouth daily. [name], MD Taking Active   atorvastatin (LIPITOR) 40 MG tablet [id number] Yes Take 1 tablet (40 mg total) by mouth daily at 6 PM. [name], MD Taking Active            Med Note [name] May 04, 2020  4:00 PM) #30 filled 04/18/2020 [facility] per external pharmacy records (Dr First)  blood glucose meter kit and supplies [id number] Yes Dispense based on patient and insurance preference. Use up to four times daily as directed. (FOR ICD-10 E10.9, E11.9). [name], MD Taking Active   Blood Glucose Monitoring Suppl (ACCU-CHEK GUIDE) w/Device [name] [id number] Yes USE AS DIRECTED [name], MD Taking Active  Blood Glucose Monitoring Suppl (TRUE METRIX METER) w/Device KIT [id number] Yes Use as directed [name], MD Taking Active   budesonide-formoterol [facility]) 160-4.5 MCG/ACT inhaler [id number] Yes Inhale 2 puffs into the lungs 2 (two) times daily. [name], MD Taking Active            Med Note [name] May 04, 2020  4:00 PM) LF 04/18/2020 [facility] per external pharmacy records (Dr First)  carvedilol (COREG) 25 MG tablet [id number] Yes Take 1 tablet (25 mg total) by mouth 2 (two) times daily with a meal. [name], MD Taking Active            Med Note [name] May 04, 2020  4:00 PM) #60/30 days filled  04/18/2020 [facility] external pharmacy records (Dr Brantley Stage)  diclofenac Sodium (VOLTAREN) 1 % GEL [id number] Yes Apply 2 g topically 4 (four) times daily. Use on elbow pain [name], MD Taking Active   fluticasone [name]) 50 MCG/ACT nasal spray [id number] Yes Place 1 spray into both nostrils daily as needed for allergies or rhinitis. Swords, Darrick Penna, MD Taking Active   furosemide (LASIX) 40 MG tablet [id number] Yes Take 1.5 tablets (60 mg total) by mouth daily. [name], MD Taking Active   glipiZIDE (GLUCOTROL) 5 MG tablet [id number] Yes Take 0.5 tablets (2.5 mg total) by mouth 2 (two) times daily before a meal. [name], MD Taking Active            Med Note [name] May 04, 2020  4:01 PM) #30/30 days filled 04/18/2020 [facility] records (Dr First)  glucose blood (ACCU-CHEK GUIDE) test strip [id number] Yes Use as instructed. Check blood glucose by fingerstick once per day. [name], MD Taking Active   glucose blood (TRUE METRIX BLOOD GLUCOSE TEST) test strip [id number] Yes Use as instructed [name], MD Taking Active   isosorbide-hydrALAZINE (BIDIL) 20-37.5 MG tablet [id number] Yes Take 1 tablet by mouth 3 (three) times daily. [name], MD Taking Active            Med Note [name] May 04, 2020  4:02 PM) #90/30 days filled 04/18/2020 [facility] records (Dr Brantley Stage)  Elmore Guise Devices (LANCING DEVICE) [place] [id number] Yes 1 Device by Does not apply route daily. Regalado, Belkys A, MD Taking Active   montelukast (SINGULAIR) 10 MG tablet [id number] Yes Take 1 tablet (10 mg total) by mouth at bedtime. [name], MD Taking Active            Med Note [name] May 04, 2020  4:02 PM) #30 filled 04/18/2020 [facility] external [facility] records (Dr First)  sacubitril-valsartan (ENTRESTO) 24-26 [place]  [id number] Yes Take 1 tablet by mouth 2 (two) times daily. [name], MD Taking Active            Med Note [name] May 04, 2020  4:03 PM) #60/30 days filled 04/18/2020 [facility] per external pharmacy records (Dr First)  Silex 28G [place] [id number] Yes Use as directed [name], MD Taking Active           Fall/Depression Screening:  Fall Risk  05/06/2020 04/17/2020 09/11/2019  Falls in the past year? 0  0 0  Number falls in past yr: 0 - 0  Injury with Fall? 0 - -   PHQ 2/9 Scores 11/01/2019 09/11/2019 07/09/2019 04/04/2019 11/30/2018 07/05/2017 05/13/2017  PHQ - 2 Score 5 1 2 6  0 3 5  PHQ- 9 Score 14 4 4 21  - 9 13    Assessment: Patient states he is doing ok, and has not found anywhere to move. Patient stated he was recently exposed to [place] and is on a 10 day isolation.   Plan: [name] will follow up in 30 days Follow-up:  Patient agrees to Care Plan and Follow-up.

## 2020-06-05 NOTE — Patient Instructions (Signed)
Visit Information  Mr. Blazier was given information about Medicaid Managed Care team care coordination services as a part of their Healthy [facility] Medicaid benefit. [name] verbally consented to engagement with the [facility] Managed Care team.   For questions related to your Healthy [facility] health plan, please call: [phone] or visit the homepage here: [email or web address]  If you would like to schedule transportation through your Healthy [facility] plan, please call the following number at least 2 days in advance of your appointment: [phone]     Social Worker will follow up in 30 days.   Ethelda Chick

## 2020-06-05 NOTE — Telephone Encounter (Signed)
Will forward to pcp

## 2020-06-05 NOTE — Telephone Encounter (Signed)
Called pt left VM , RX sent to pharmacy.Name and contact info to call back provided.

## 2020-06-05 NOTE — Telephone Encounter (Signed)
Medication Refill - Medication: Tapered Gout Medication- Pt states that he does not remember the name of the medication.   Has the patient contacted their pharmacy? Yes.   (Agent: If no, request that the patient contact the pharmacy for the refill.) (Agent: If yes, when and what did the pharmacy advise?)  Preferred Pharmacy (with phone number or street name):  [place], [place] [address]  [place] [address]  Phone: [phone] Fax: [phone]  Hours: Not open 24 hours     Agent: Please be advised that RX refills may take up to 3 business days. We ask that you follow-up with your pharmacy.

## 2020-06-11 ENCOUNTER — Other Ambulatory Visit: Payer: Self-pay | Admitting: *Deleted

## 2020-06-11 NOTE — Patient Instructions (Signed)
Visit Information  [name]  - as a part of your Medicaid benefit, you are eligible for care management and care coordination services at no cost or copay. I was unable to reach you by phone today but would be happy to help you with your health related needs. Please feel free to call me @ [phone].   A member of the Managed Medicaid care management team will reach out to you again over the next 7-14 days.   Raciel Caffrey RN, BSN [place]  [facility] RN Care Coordinator   

## 2020-06-11 NOTE — Patient Outreach (Signed)
Care Coordination  06/11/2020  CRISTO AUSBURN 1961-02-23 [id number]    Medicaid Managed Care   Unsuccessful Outreach Note  06/11/2020 Name: AVYUKTH BONTEMPO MRN: [id number] DOB: March 10, 1961  Referred by: [name], MD Reason for referral : High Risk Managed Medicaid (Unsuccessful follow up outreach)   An unsuccessful telephone outreach was attempted today. The patient was referred to the case management team for assistance with care management and care coordination.   Follow Up Plan: RNCM will reach out again over the next 7-14 days for follow up  Estanislado Emms RN, BSN [place]  [facility] RN Care Coordinator

## 2020-06-16 ENCOUNTER — Other Ambulatory Visit: Payer: Self-pay

## 2020-06-16 ENCOUNTER — Emergency Department (HOSPITAL_COMMUNITY): Payer: Medicaid Other

## 2020-06-16 ENCOUNTER — Inpatient Hospital Stay (HOSPITAL_COMMUNITY)
Admission: EM | Admit: 2020-06-16 | Discharge: 2020-06-18 | DRG: 291 | Discharge status: Against Medical Advice | Payer: Medicaid Other | Attending: Internal Medicine | Admitting: Internal Medicine

## 2020-06-16 DIAGNOSIS — I5082 Biventricular heart failure: Secondary | ICD-10-CM | POA: Diagnosis present

## 2020-06-16 DIAGNOSIS — F1410 Cocaine abuse, uncomplicated: Secondary | ICD-10-CM | POA: Diagnosis present

## 2020-06-16 DIAGNOSIS — Z8739 Personal history of other diseases of the musculoskeletal system and connective tissue: Secondary | ICD-10-CM

## 2020-06-16 DIAGNOSIS — Z7951 Long term (current) use of inhaled steroids: Secondary | ICD-10-CM

## 2020-06-16 DIAGNOSIS — Z20822 Contact with and (suspected) exposure to covid-19: Secondary | ICD-10-CM | POA: Diagnosis present

## 2020-06-16 DIAGNOSIS — R079 Chest pain, unspecified: Secondary | ICD-10-CM | POA: Diagnosis not present

## 2020-06-16 DIAGNOSIS — Z79899 Other long term (current) drug therapy: Secondary | ICD-10-CM

## 2020-06-16 DIAGNOSIS — Z7984 Long term (current) use of oral hypoglycemic drugs: Secondary | ICD-10-CM

## 2020-06-16 DIAGNOSIS — F17210 Nicotine dependence, cigarettes, uncomplicated: Secondary | ICD-10-CM | POA: Diagnosis present

## 2020-06-16 DIAGNOSIS — F32A Depression, unspecified: Secondary | ICD-10-CM | POA: Diagnosis present

## 2020-06-16 DIAGNOSIS — I10 Essential (primary) hypertension: Secondary | ICD-10-CM | POA: Diagnosis present

## 2020-06-16 DIAGNOSIS — I5043 Acute on chronic combined systolic (congestive) and diastolic (congestive) heart failure: Secondary | ICD-10-CM | POA: Diagnosis not present

## 2020-06-16 DIAGNOSIS — R0902 Hypoxemia: Secondary | ICD-10-CM | POA: Diagnosis not present

## 2020-06-16 DIAGNOSIS — N183 Chronic kidney disease, stage 3 unspecified: Secondary | ICD-10-CM | POA: Diagnosis present

## 2020-06-16 DIAGNOSIS — Z5329 Procedure and treatment not carried out because of patient's decision for other reasons: Secondary | ICD-10-CM | POA: Diagnosis not present

## 2020-06-16 DIAGNOSIS — Z23 Encounter for immunization: Secondary | ICD-10-CM

## 2020-06-16 DIAGNOSIS — J9621 Acute and chronic respiratory failure with hypoxia: Secondary | ICD-10-CM | POA: Diagnosis present

## 2020-06-16 DIAGNOSIS — I130 Hypertensive heart and chronic kidney disease with heart failure and stage 1 through stage 4 chronic kidney disease, or unspecified chronic kidney disease: Principal | ICD-10-CM | POA: Diagnosis present

## 2020-06-16 DIAGNOSIS — Z7982 Long term (current) use of aspirin: Secondary | ICD-10-CM

## 2020-06-16 DIAGNOSIS — J189 Pneumonia, unspecified organism: Secondary | ICD-10-CM | POA: Diagnosis not present

## 2020-06-16 DIAGNOSIS — E1122 Type 2 diabetes mellitus with diabetic chronic kidney disease: Secondary | ICD-10-CM | POA: Diagnosis present

## 2020-06-16 DIAGNOSIS — Z9981 Dependence on supplemental oxygen: Secondary | ICD-10-CM

## 2020-06-16 DIAGNOSIS — J441 Chronic obstructive pulmonary disease with (acute) exacerbation: Secondary | ICD-10-CM | POA: Diagnosis not present

## 2020-06-16 DIAGNOSIS — I2510 Atherosclerotic heart disease of native coronary artery without angina pectoris: Secondary | ICD-10-CM | POA: Diagnosis present

## 2020-06-16 DIAGNOSIS — Z833 Family history of diabetes mellitus: Secondary | ICD-10-CM

## 2020-06-16 DIAGNOSIS — R0602 Shortness of breath: Secondary | ICD-10-CM

## 2020-06-16 DIAGNOSIS — E782 Mixed hyperlipidemia: Secondary | ICD-10-CM | POA: Diagnosis present

## 2020-06-16 DIAGNOSIS — R778 Other specified abnormalities of plasma proteins: Secondary | ICD-10-CM | POA: Diagnosis present

## 2020-06-16 DIAGNOSIS — K219 Gastro-esophageal reflux disease without esophagitis: Secondary | ICD-10-CM | POA: Diagnosis present

## 2020-06-16 DIAGNOSIS — Z8249 Family history of ischemic heart disease and other diseases of the circulatory system: Secondary | ICD-10-CM

## 2020-06-16 DIAGNOSIS — M109 Gout, unspecified: Secondary | ICD-10-CM | POA: Diagnosis present

## 2020-06-16 DIAGNOSIS — I509 Heart failure, unspecified: Secondary | ICD-10-CM

## 2020-06-16 DIAGNOSIS — R0789 Other chest pain: Secondary | ICD-10-CM | POA: Diagnosis not present

## 2020-06-16 DIAGNOSIS — J440 Chronic obstructive pulmonary disease with acute lower respiratory infection: Secondary | ICD-10-CM | POA: Diagnosis present

## 2020-06-16 LAB — RESP PANEL BY RT-PCR (FLU A&B, COVID) ARPGX2
Influenza A by PCR: NEGATIVE
Influenza B by PCR: NEGATIVE
SARS Coronavirus 2 by RT PCR: NEGATIVE

## 2020-06-16 LAB — TROPONIN I (HIGH SENSITIVITY)
Troponin I (High Sensitivity): 59 ng/L — ABNORMAL HIGH (ref <18)
Troponin I (High Sensitivity): 48 ng/L — ABNORMAL HIGH (ref <18)

## 2020-06-16 LAB — CBC
HCT: 48.2 % (ref 39.0–52.0)
Hemoglobin: 15.2 g/dL (ref 13.0–17.0)
MCH: 30.3 pg (ref 26.0–34.0)
MCHC: 31.5 g/dL (ref 30.0–36.0)
MCV: 96.2 fL (ref 80.0–100.0)
Platelets: 250 10*3/uL (ref 150–400)
RBC: 5.01 MIL/uL (ref 4.22–5.81)
RDW: 14.3 % (ref 11.5–15.5)
WBC: 12.9 10*3/uL — ABNORMAL HIGH (ref 4.0–10.5)
nRBC: 0 % (ref 0.0–0.2)

## 2020-06-16 LAB — BASIC METABOLIC PANEL
Anion gap: 11 (ref 5–15)
BUN: 32 mg/dL — ABNORMAL HIGH (ref 6–20)
CO2: 22 mmol/L (ref 22–32)
Calcium: 8.6 mg/dL — ABNORMAL LOW (ref 8.9–10.3)
Chloride: 105 mmol/L (ref 98–111)
Creatinine, Ser: 2.17 mg/dL — ABNORMAL HIGH (ref 0.61–1.24)
GFR, Estimated: 35 mL/min — ABNORMAL LOW (ref >60)
Glucose, Bld: 83 mg/dL (ref 70–99)
Potassium: 4.8 mmol/L (ref 3.5–5.1)
Sodium: 138 mmol/L (ref 135–145)

## 2020-06-16 LAB — BRAIN NATRIURETIC PEPTIDE: B Natriuretic Peptide: 2509.1 pg/mL — ABNORMAL HIGH (ref 0.0–100.0)

## 2020-06-16 MED ORDER — FUROSEMIDE 10 MG/ML IJ SOLN
40.0000 mg | Freq: Two times a day (BID) | INTRAMUSCULAR | Status: DC
  Administered 2020-06-17 – 2020-06-18 (×3): 40 mg via INTRAVENOUS
  Filled 2020-06-16 (×3): qty 4

## 2020-06-16 MED ORDER — SODIUM CHLORIDE 0.9 % IV SOLN
500.0000 mg | Freq: Once | INTRAVENOUS | Status: AC
  Administered 2020-06-16: 500 mg via INTRAVENOUS
  Filled 2020-06-16: qty 500

## 2020-06-16 MED ORDER — MOMETASONE FURO-FORMOTEROL FUM 200-5 MCG/ACT IN AERO
2.0000 | INHALATION_SPRAY | Freq: Two times a day (BID) | RESPIRATORY_TRACT | Status: DC
  Administered 2020-06-16: 2 via RESPIRATORY_TRACT
  Filled 2020-06-16 (×2): qty 8.8

## 2020-06-16 MED ORDER — ISOSORB DINITRATE-HYDRALAZINE 20-37.5 MG PO TABS
1.0000 | ORAL_TABLET | Freq: Three times a day (TID) | ORAL | Status: DC
  Administered 2020-06-17 (×3): 1 via ORAL
  Filled 2020-06-16 (×3): qty 1

## 2020-06-16 MED ORDER — POLYETHYLENE GLYCOL 3350 17 G PO PACK: 17.0000 g | PACK | Freq: Every day | ORAL | Status: DC | PRN

## 2020-06-16 MED ORDER — SODIUM CHLORIDE 0.9% FLUSH
3.0000 mL | Freq: Two times a day (BID) | INTRAVENOUS | Status: DC
  Administered 2020-06-16 – 2020-06-18 (×4): 3 mL via INTRAVENOUS

## 2020-06-16 MED ORDER — MONTELUKAST SODIUM 10 MG PO TABS
10.0000 mg | ORAL_TABLET | Freq: Every day | ORAL | Status: DC
  Administered 2020-06-17: 10 mg via ORAL
  Filled 2020-06-16: qty 1

## 2020-06-16 MED ORDER — PREDNISONE 20 MG PO TABS
40.0000 mg | ORAL_TABLET | Freq: Every day | ORAL | Status: DC
  Administered 2020-06-16 – 2020-06-18 (×3): 40 mg via ORAL
  Filled 2020-06-16 (×3): qty 2

## 2020-06-16 MED ORDER — ALBUTEROL SULFATE (2.5 MG/3ML) 0.083% IN NEBU: 3.0000 mL | INHALATION_SOLUTION | Freq: Four times a day (QID) | RESPIRATORY_TRACT | Status: DC | PRN

## 2020-06-16 MED ORDER — SACUBITRIL-VALSARTAN 24-26 MG PO TABS
1.0000 | ORAL_TABLET | Freq: Two times a day (BID) | ORAL | Status: DC
  Administered 2020-06-16 – 2020-06-18 (×4): 1 via ORAL
  Filled 2020-06-16 (×4): qty 1

## 2020-06-16 MED ORDER — ASPIRIN EC 81 MG PO TBEC
81.0000 mg | DELAYED_RELEASE_TABLET | Freq: Every day | ORAL | Status: DC
  Administered 2020-06-17 – 2020-06-18 (×2): 81 mg via ORAL
  Filled 2020-06-16 (×2): qty 1

## 2020-06-16 MED ORDER — SODIUM CHLORIDE 0.9 % IV SOLN
1.0000 g | INTRAVENOUS | Status: DC
  Administered 2020-06-16: 1 g via INTRAVENOUS
  Filled 2020-06-16: qty 10

## 2020-06-16 MED ORDER — IPRATROPIUM-ALBUTEROL 0.5-2.5 (3) MG/3ML IN SOLN
3.0000 mL | Freq: Four times a day (QID) | RESPIRATORY_TRACT | Status: DC
  Administered 2020-06-17 (×3): 3 mL via RESPIRATORY_TRACT
  Filled 2020-06-16 (×5): qty 3

## 2020-06-16 MED ORDER — ATORVASTATIN CALCIUM 40 MG PO TABS
40.0000 mg | ORAL_TABLET | Freq: Every day | ORAL | Status: DC
  Administered 2020-06-17: 40 mg via ORAL
  Filled 2020-06-16 (×2): qty 1

## 2020-06-16 MED ORDER — ACETAMINOPHEN 325 MG PO TABS: 650.0000 mg | ORAL_TABLET | Freq: Four times a day (QID) | ORAL | Status: DC | PRN

## 2020-06-16 MED ORDER — ENOXAPARIN SODIUM 40 MG/0.4ML ~~LOC~~ SOLN
40.0000 mg | SUBCUTANEOUS | Status: DC
  Administered 2020-06-16 – 2020-06-17 (×2): 40 mg via SUBCUTANEOUS
  Filled 2020-06-16 (×2): qty 0.4

## 2020-06-16 MED ORDER — ACETAMINOPHEN 650 MG RE SUPP: 650.0000 mg | Freq: Four times a day (QID) | RECTAL | Status: DC | PRN

## 2020-06-16 MED ORDER — INSULIN ASPART 100 UNIT/ML ~~LOC~~ SOLN
0.0000 [IU] | Freq: Three times a day (TID) | SUBCUTANEOUS | Status: DC
  Administered 2020-06-17: 2 [IU] via SUBCUTANEOUS
  Administered 2020-06-17: 3 [IU] via SUBCUTANEOUS

## 2020-06-16 MED ORDER — ALBUTEROL SULFATE HFA 108 (90 BASE) MCG/ACT IN AERS
8.0000 | INHALATION_SPRAY | Freq: Once | RESPIRATORY_TRACT | Status: AC
  Administered 2020-06-16: 8 via RESPIRATORY_TRACT
  Filled 2020-06-16: qty 6.7

## 2020-06-16 MED ORDER — CARVEDILOL 25 MG PO TABS
25.0000 mg | ORAL_TABLET | Freq: Two times a day (BID) | ORAL | Status: DC
  Administered 2020-06-17 – 2020-06-18 (×3): 25 mg via ORAL
  Filled 2020-06-16 (×3): qty 1

## 2020-06-16 MED ORDER — AEROCHAMBER PLUS FLO-VU LARGE MISC
1.0000 | Freq: Once | Status: AC
  Administered 2020-06-16: 1

## 2020-06-16 MED ORDER — DEXTROSE 5 % IV SOLN
250.0000 mg | INTRAVENOUS | Status: DC
  Filled 2020-06-16: qty 250

## 2020-06-16 MED ORDER — ALLOPURINOL 100 MG PO TABS
200.0000 mg | ORAL_TABLET | Freq: Every day | ORAL | Status: DC
  Administered 2020-06-17 – 2020-06-18 (×2): 200 mg via ORAL
  Filled 2020-06-16 (×2): qty 2

## 2020-06-16 MED ORDER — FUROSEMIDE 10 MG/ML IJ SOLN
40.0000 mg | Freq: Once | INTRAMUSCULAR | Status: AC
  Administered 2020-06-16: 40 mg via INTRAVENOUS
  Filled 2020-06-16: qty 4

## 2020-06-16 NOTE — ED Notes (Signed)
Patient refusing to keep on bp cuff/pulse ox. Vital sign could not be obtained

## 2020-06-16 NOTE — ED Notes (Signed)
Placed Pt on 2L Nasal O2

## 2020-06-16 NOTE — H&P (Addendum)
History and Physical   Chris [name]:[id number] DOB: 14-Oct-1960 DOA: 06/16/2020  PCP: [name], MD   Patient coming from: Home  Chief Complaint: Chest pain, worsening shortness of breath  HPI: Chris [name] is a 60 y.o. 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  Patient states he has had several days of worsening shortness of breath he has tried some albuterol at home without relief.  He has some shortness of breath at rest and it worsens with exertion.  He is on 2 L chronically at home and remains on this in ED.  He reports that today he had 10 seconds of a sharp chest pain that went across his chest and scared him prompting him to be evaluated in ED.  He is under significant stress and did have a court date today of note, the EDP updated the DA to inform him that he was in the hospital.  He states that he has been taking his medications regularly he is not sure what they are called but he takes about 9 pills in the morning which aligns with his med list.  He denies significant increase in fluids or salt he does state he has been having a lot of [place] recently based on what he can afford.  He states his weight fluctuates is not sure if it is up or down right now.   He reports some diarrhea in the past week but this is resolved at this time.  He also reports increased sputum production which is whitish.  He also reports pain in his feet which feels like his typical gout flare.  Patient denies fevers, chills, abdominal pain, nausea, vomiting, constipation.  ED Course: Vitals in ED significant for blood pressure in the 277O 242P systolic and respiratory rate in the 20s.  Lab work-up showed BMP with creatinine of 2.7 which is near baseline of 1.9.  Calcium 8.6.  CBC showed leukocytosis of 12.9.   Troponin downtrending and chronic leg elevated here measurements were 59 and 48.  BNP elevated to 2500.  Respiratory panel for flu and Covid negative.  Patient received dose of Lasix and inhaler treatments in the ED.  Review of Systems: As per HPI otherwise all other systems reviewed and are negative.  Past Medical History:  Diagnosis Date  . CHF (congestive heart failure) ([place])   . Chronic kidney disease   . COPD (chronic obstructive pulmonary disease) ([place])   . Coronary artery disease   . Depression   . Diabetes mellitus without complication ([place])   . GERD (gastroesophageal reflux disease)   . Gout   . Hypertension   . Influenza A with respiratory manifestations   . Mental disorder     Past Surgical History:  Procedure Laterality Date  . ANKLE SURGERY    . CARDIAC CATHETERIZATION    . HERNIA REPAIR     x2  . SHOULDER SURGERY      Social History  reports that he has been smoking cigarettes. He has a 20.00 pack-year smoking history. He has never used smokeless tobacco. He reports current drug use. Frequency: 21.00 times per week. Drugs: Marijuana and Cocaine. He reports that he does not drink alcohol.  No Known Allergies  Family History  Problem Relation Age of Onset  . Heart disease Father   . Diabetes Mother   .  HIV Brother   . Healthy Son   . Healthy Daughter   Reviewed on admission  Prior to Admission medications   Medication Sig Start Date End Date Taking? Authorizing Provider  allopurinol (ZYLOPRIM) 100 MG tablet Take 2 tablets (200 mg total) by mouth daily. 04/17/20  Yes [name], MD  aspirin 81 MG EC tablet Take 1 tablet (81 mg total) by mouth daily. 03/06/20  Yes [name], MD  atorvastatin (LIPITOR) 40 MG tablet Take 1 tablet (40 mg total) by mouth daily at 6 PM. 04/17/20  Yes [name], MD  carvedilol (COREG) 25 MG tablet Take 1 tablet (25 mg total) by mouth 2 (two) times daily with a meal. 04/17/20  Yes [name], MD   furosemide (LASIX) 40 MG tablet Take 1.5 tablets (60 mg total) by mouth daily. 05/05/20  Yes [name], MD  glipiZIDE (GLUCOTROL) 5 MG tablet Take 0.5 tablets (2.5 mg total) by mouth 2 (two) times daily before a meal. 04/17/20  Yes [name], MD  isosorbide-hydrALAZINE (BIDIL) 20-37.5 MG tablet Take 1 tablet by mouth 3 (three) times daily. 04/17/20  Yes [name], MD  montelukast (SINGULAIR) 10 MG tablet Take 1 tablet (10 mg total) by mouth at bedtime. 04/17/20 07/16/20 Yes [name], MD  sacubitril-valsartan (ENTRESTO) 24-26 MG Take 1 tablet by mouth 2 (two) times daily. 04/17/20  Yes [name], MD  albuterol (PROVENTIL) (2.5 MG/3ML) 0.083% nebulizer solution Take 3 mLs by nebulization every 6 (six) hours as needed for shortness of breath. 04/17/20   [name], MD  albuterol (VENTOLIN HFA) 108 (90 Base) MCG/ACT inhaler Inhale 2 puffs into the lungs every 6 (six) hours as needed for wheezing or shortness of breath. 04/17/20   [name], MD  blood glucose meter kit and supplies Dispense based on patient and insurance preference. Use up to four times daily as directed. (FOR ICD-10 E10.9, E11.9). 04/17/20   [name], MD  Blood Glucose Monitoring Suppl (ACCU-CHEK GUIDE) w/Device KIT USE AS DIRECTED 04/17/20   [name], MD  Blood Glucose Monitoring Suppl (TRUE METRIX METER) w/Device KIT Use as directed 04/17/20   [name], MD  budesonide-formoterol [facility]) 160-4.5 MCG/ACT inhaler Inhale 2 puffs into the lungs 2 (two) times daily. 04/17/20   [name], MD  diclofenac Sodium (VOLTAREN) 1 % GEL Apply 2 g topically 4 (four) times daily. Use on elbow pain 05/05/20   [name], MD  fluticasone [facility]) 50 MCG/ACT nasal spray Place 1 spray into both nostrils daily as needed for allergies or rhinitis. 05/06/20   Swords, Valetta Mole, MD  glucose blood (ACCU-CHEK GUIDE) test strip Use as instructed. Check blood glucose by  fingerstick once per day. 03/06/20   [name], MD  glucose blood (TRUE METRIX BLOOD GLUCOSE TEST) test strip Use as instructed 12/28/19   [name], MD  Lancet Devices (LANCING DEVICE) MISC 1 Device by Does not apply route daily. 12/27/19   Regalado, Prentiss Bells, MD  TRUEplus Lancets 28G MISC Use as directed 12/28/19   [name], MD    Physical Exam: Vitals:   06/16/20 1745 06/16/20 1800 06/16/20 1815 06/16/20 1900  BP:  (!) 152/98 (!) 148/98 (!) 125/93  Pulse: 78 78 84 81  Resp: (!) 24 (!) 21 (!) 29 (!) 33  Temp:      TempSrc:      SpO2: 100% 90% 94% 100%  Physical Exam Constitutional:      General: He is not in acute distress.    Appearance: Normal appearance. He is obese.  HENT:     Head: Normocephalic and atraumatic.     Mouth/Throat:     Mouth: Mucous membranes are moist.     Pharynx: Oropharynx is clear.  Eyes:     Extraocular Movements: Extraocular movements intact.     Pupils: Pupils are equal, round, and reactive to light.  Neck:     Comments: Elevated JVP Cardiovascular:     Rate and Rhythm: Normal rate and regular rhythm.     Pulses: Normal pulses.     Heart sounds: Normal heart sounds.  Pulmonary:     Effort: No respiratory distress.     Breath sounds: Wheezing, rhonchi and rales present.     Comments: Tachypnea, mildly increased work of breathing. Abdominal:     General: Bowel sounds are normal. There is no distension.     Palpations: Abdomen is soft.     Tenderness: There is no abdominal tenderness.  Musculoskeletal:        General: No swelling or deformity.     Right lower leg: Edema present.     Left lower leg: Edema present.  Skin:    General: Skin is warm and dry.  Neurological:     General: No focal deficit present.     Mental Status: Mental status is at baseline.    Labs on Admission: I have personally reviewed following labs and imaging studies  CBC: Recent Labs  Lab 06/16/20 0738  WBC 12.9*  HGB 15.2  HCT 48.2   MCV 96.2  PLT 449    Basic Metabolic Panel: Recent Labs  Lab 06/16/20 0738  NA 138  K 4.8  CL 105  CO2 22  GLUCOSE 83  BUN 32*  CREATININE 2.17*  CALCIUM 8.6*    GFR: CrCl cannot be calculated (Unknown ideal weight.).  Liver Function Tests: No results for input(s): AST, ALT, ALKPHOS, BILITOT, PROT, ALBUMIN in the last 168 hours.  Urine analysis:    Component Value Date/Time   COLORURINE STRAW (A) 08/10/2018 2230   APPEARANCEUR Clear 11/30/2018 1035   LABSPEC 1.005 08/10/2018 2230   PHURINE 6.0 08/10/2018 2230   GLUCOSEU Negative 11/30/2018 1035   HGBUR LARGE (A) 08/10/2018 2230   BILIRUBINUR Negative 11/30/2018 1035   KETONESUR NEGATIVE 08/10/2018 2230   PROTEINUR 1+ (A) 11/30/2018 1035   PROTEINUR 100 (A) 08/10/2018 2230   NITRITE Negative 11/30/2018 1035   NITRITE NEGATIVE 08/10/2018 2230   LEUKOCYTESUR Negative 11/30/2018 1035   LEUKOCYTESUR NEGATIVE 08/10/2018 2230    Radiological Exams on Admission: DG Chest 1 View  Result Date: 06/16/2020 CLINICAL DATA:  Chest pain and short of breath EXAM: CHEST  1 VIEW COMPARISON:  05/15/2020 FINDINGS: Cardiac enlargement without heart failure or edema. Hypoventilation with bibasilar atelectasis/infiltrate. No significant effusion. IMPRESSION: Hypoventilation with bibasilar atelectasis/infiltrate. Electronically Signed   By: Franchot Gallo M.D.   On: 06/16/2020 07:54    EKG: Independently reviewed.  Normal sinus rhythm at a rate of 74 bpm.  Prolonged QTC at 483.  RSR prime in leads I aVR, borderline intraventricular conduction delay.  Some lateral T wave flattening/inversion.  Similar to previous.  Assessment/Plan Principal Problem:   Acute exacerbation of CHF (congestive heart failure) (HCC) Active Problems:   Mixed hyperlipidemia   Essential hypertension   CKD (chronic kidney disease) stage 3, GFR 30-59 ml/min (HCC)   CAP (community acquired pneumonia)  History of gout   Type 2 diabetes mellitus with stage 3  chronic kidney disease ([place])   COPD with acute exacerbation (HCC)   CHF exacerbation > Last EF in July 2021 was 30 to 35% with global hypokinesis. > BNP elevated to greater than 2500 which is thousand more than previous elevations. > Patient reports he is taking his medication daily and has not had significant increase in his salt or fluid intake > Received 40 mg IV Lasix in ED - Continue 40 mg IV Lasix twice daily - Continue home carvedilol, BiDil, Entresto - Daily weights, I/O - Heart healthy diet with fluid restriction - Echocardiogram - Trend renal function and electrolytes  COPD exacerbation > On 2 L chronic O2 at home remains on 2 L in ED, but is tachypneic > Wheezing on exam, was significant per EDP, but is trace for me - Prednisone 40 mg daily - Home Symbicort replaced with Dulera twice daily - DuoNebs every 6 hours while awake - As needed albuterol  CAP > Infiltrate vs Atelectasis on chest x-ray with leukocytosis to 12.9, rhonchi, and reported white sputum production > Possible trigger for COPD exacerbation - Start ceftriaxone and azithromycin - Trend fever curve and white count, expect white count to be slow to resolve given concomitant steroids  CKD 3 > Creatinine 2.17 which is near baseline of 1.9 - Avoid nephrotoxic agents - Trend renal function electrolytes  Hypertension > BP in the 629U to 765Y systolic in ED - Continue home carvedilol, BiDil, Entresto - On Lasix as above  Hyperlipidemia - Continue home atorvastatin  Diabetes > Monitor for glucose increases while on steroids - SSI  Gout > On allopurinol at home > States he is having pain similar to previous flares - Will be receiving steroids for COPD as above which should treat gout as well - Continue allopurinol  DVT prophylaxis: Lovenox  Code Status:   Full  Family Communication:  None on admission Disposition Plan:   Patient is from:  Home  Anticipated DC to:  Home  Anticipated DC date:  1  to 4 days  Anticipated DC barriers: None  Consults called:  None  Admission status:  Observation, telemetry   Severity of Illness: The appropriate patient status for this patient is OBSERVATION. Observation status is judged to be reasonable and necessary in order to provide the required intensity of service to ensure the patient's safety. The patient's presenting symptoms, physical exam findings, and initial radiographic and laboratory data in the context of their medical condition is felt to place them at decreased risk for further clinical deterioration. Furthermore, it is anticipated that the patient will be medically stable for discharge from the hospital within 2 midnights of admission. The following factors support the patient status of observation.   " The patient's presenting symptoms include shortness of breath, tachypnea, chest pain. " The physical exam findings include tachypnea, wheezing, rales, rhonchi. " The initial radiographic and laboratory data are concerning for CHF exacerbation with BNP of 2503, COPD exacerbation with wheezing and tachypnea.?  Pneumonia versus volume overload with bibasilar atelectasis versus infiltrate on chest x-ray.  Leukocytosis to 12.9.  Labs otherwise stable.Marcelyn Bruins MD [facility]  How to contact the [facility] Attending or Consulting provider [place] or covering provider during after hours [place], for this patient?   1. Check the care team in [facility] and look for a) attending/consulting [facility] provider listed and b) the [facility] team listed 2. Log  into [email or web address] and use [place]'s universal password to access. If you do not have the password, please contact the hospital operator. 3. Locate the [facility] provider you are looking for under [facility] and page to a number that you can be directly reached. 4. If you still have difficulty reaching the provider, please page the [facility] (Director on Call) for the Hospitalists listed on amion for  assistance.  06/16/2020, 7:43 PM

## 2020-06-16 NOTE — ED Provider Notes (Signed)
[facility] EMERGENCY DEPARTMENT Provider Note   CSN: [id number] Arrival date & time: 06/16/20  3151     History No chief complaint on file.   Chris [name] is a 60 y.o. male. With a past medical history of chronic respiratory failure, chronic combined systolic and diastolic heart failure, COPD, history of cocaine abuse, CKD stage III, chronically elevated troponin levels, type 2 diabetes and tobacco dependence.  Presents emergency department for complaint of chest pain.  He states that he is under significant stress and had about 10 seconds of very sharp pain that moved all the way across his chest.  This scared him and brought him in.  The patient also states that he has had worsening shortness of breath for several days.  He states that he has chronic peripheral edema which is unchanged.  He states that he has been using his albuterol inhalers without much relief.  He is short of breath at rest but worse with exertion.  He denies any active chest pain and has had no recurrence of the chest pain that brought him in this morning.  He denies fevers, chills, cough.  He feels like he needs to expectorate however is unable to "cough it up."  HPI     Past Medical History:  Diagnosis Date  . CHF (congestive heart failure) ([place])   . Chronic kidney disease   . COPD (chronic obstructive pulmonary disease) ([place])   . Coronary artery disease   . Depression   . Diabetes mellitus without complication ([place])   . GERD (gastroesophageal reflux disease)   . Gout   . Hypertension   . Influenza A with respiratory manifestations   . Mental disorder     Patient Active Problem List   Diagnosis Date Noted  . Influenza vaccine refused 05/06/2020  . Acute on chronic combined systolic (congestive) and diastolic (congestive) heart failure ([place]) 05/05/2020  . Acute decompensated heart failure ([place]) 05/04/2020  . Illiteracy 05/04/2020  . Chronic respiratory failure with hypoxia, on home  oxygen therapy ([place]) 12/28/2019  . Type 2 diabetes mellitus with stage 3 chronic kidney disease ([place]) 12/25/2019  . Acute and chronic respiratory failure (acute-on-chronic) ([place]) 12/25/2019  . Acute on chronic combined systolic and diastolic CHF (congestive heart failure) ([place]) 10/26/2019  . Elevated troponin I level 10/26/2019  . Acute on chronic diastolic (congestive) heart failure ([place]) 10/26/2019  . History of gout 02/01/2019  . Seasonal allergic rhinitis due to pollen 02/01/2019  . Tobacco dependence 11/30/2018  . Microscopic hematuria 11/30/2018  . Depression 11/30/2018  . Difficulty controlling anger 11/30/2018  . COPD (chronic obstructive pulmonary disease) ([place])   . CKD (chronic kidney disease) stage 3, GFR 30-59 ml/min (HCC) 08/10/2018  . Recurrent epistaxis 04/21/2018  . Mixed hyperlipidemia 07/28/2017  . Essential hypertension 07/28/2017  . Chronic systolic heart failure ([place]) 10/25/2014  . Cocaine abuse ([place]) 02/20/2013  . Cannabis abuse 02/20/2013  . Back pain, chronic 02/20/2013    Past Surgical History:  Procedure Laterality Date  . ANKLE SURGERY    . CARDIAC CATHETERIZATION    . HERNIA REPAIR     x2  . SHOULDER SURGERY         Family History  Problem Relation Age of Onset  . Heart disease Father   . Diabetes Mother   . HIV Brother   . Healthy Son   . Healthy Daughter     Social History   Tobacco Use  . Smoking status: Current Every Day  Smoker    Packs/day: 1.00    Years: 20.00    Pack years: 20.00    Types: Cigarettes  . Smokeless tobacco: Never Used  . Tobacco comment: less than 1 pack per day  Vaping Use  . Vaping Use: Never used  Substance Use Topics  . Alcohol use: No  . Drug use: Yes    Frequency: 21.0 times per week    Types: Marijuana, Cocaine    Comment: no longer- Cocaine    Home Medications Prior to Admission medications   Medication Sig Start Date End Date Taking? Authorizing Provider  albuterol (PROVENTIL) (2.5 MG/3ML)  0.083% nebulizer solution Take 3 mLs by nebulization every 6 (six) hours as needed for shortness of breath. 04/17/20   [name], MD  albuterol (VENTOLIN HFA) 108 (90 Base) MCG/ACT inhaler Inhale 2 puffs into the lungs every 6 (six) hours as needed for wheezing or shortness of breath. 04/17/20   [name], MD  allopurinol (ZYLOPRIM) 100 MG tablet Take 2 tablets (200 mg total) by mouth daily. 04/17/20   [name], MD  aspirin 81 MG EC tablet Take 1 tablet (81 mg total) by mouth daily. 03/06/20   [name], MD  atorvastatin (LIPITOR) 40 MG tablet Take 1 tablet (40 mg total) by mouth daily at 6 PM. 04/17/20   [name], MD  blood glucose meter kit and supplies Dispense based on patient and insurance preference. Use up to four times daily as directed. (FOR ICD-10 E10.9, E11.9). 04/17/20   [name], MD  Blood Glucose Monitoring Suppl (ACCU-CHEK GUIDE) w/Device KIT USE AS DIRECTED 04/17/20   [name], MD  Blood Glucose Monitoring Suppl (TRUE METRIX METER) w/Device KIT Use as directed 04/17/20   [name], MD  budesonide-formoterol [facility]) 160-4.5 MCG/ACT inhaler Inhale 2 puffs into the lungs 2 (two) times daily. 04/17/20   [name], MD  carvedilol (COREG) 25 MG tablet Take 1 tablet (25 mg total) by mouth 2 (two) times daily with a meal. 04/17/20   [name], MD  diclofenac Sodium (VOLTAREN) 1 % GEL Apply 2 g topically 4 (four) times daily. Use on elbow pain 05/05/20   [name], MD  fluticasone [facility]) 50 MCG/ACT nasal spray Place 1 spray into both nostrils daily as needed for allergies or rhinitis. 05/06/20   Swords, Darrick Penna, MD  furosemide (LASIX) 40 MG tablet Take 1.5 tablets (60 mg total) by mouth daily. 05/05/20   [name], MD  glipiZIDE (GLUCOTROL) 5 MG tablet Take 0.5 tablets (2.5 mg total) by mouth 2 (two) times daily before a meal. 04/17/20   [name], MD  glucose blood (ACCU-CHEK GUIDE) test  strip Use as instructed. Check blood glucose by fingerstick once per day. 03/06/20   [name], MD  glucose blood (TRUE METRIX BLOOD GLUCOSE TEST) test strip Use as instructed 12/28/19   [name], MD  isosorbide-hydrALAZINE (BIDIL) 20-37.5 MG tablet Take 1 tablet by mouth 3 (three) times daily. 04/17/20   [name], MD  Lancet Devices (LANCING DEVICE) MISC 1 Device by Does not apply route daily. 12/27/19   Regalado, Belkys A, MD  montelukast (SINGULAIR) 10 MG tablet Take 1 tablet (10 mg total) by mouth at bedtime. 04/17/20 07/16/20  [name], MD  sacubitril-valsartan (ENTRESTO) 24-26 MG Take 1 tablet by mouth 2 (two) times daily. 04/17/20   [name], MD  TRUEplus Lancets 28G MISC  Use as directed 12/28/19   [name], MD    Allergies    Patient has no known allergies.  Review of Systems   Review of Systems Ten systems reviewed and are negative for acute change, except as noted in the HPI.   Physical Exam Updated Vital Signs BP (!) 148/98   Pulse 84   Temp (!) 97.4 F (36.3 C) (Oral)   Resp (!) 29   SpO2 94%   Physical Exam Vitals and nursing note reviewed.  Constitutional:      General: He is not in acute distress.    Appearance: He is well-developed and well-nourished. He is not diaphoretic.  HENT:     Head: Normocephalic and atraumatic.  Eyes:     General: No scleral icterus.    Conjunctiva/sclera: Conjunctivae normal.  Cardiovascular:     Rate and Rhythm: Normal rate and regular rhythm.     Heart sounds: Normal heart sounds.  Pulmonary:     Effort: Tachypnea, accessory muscle usage and prolonged expiration present. No respiratory distress.     Breath sounds: Decreased air movement present. Wheezing, rhonchi and rales present.  Abdominal:     Palpations: Abdomen is soft.     Tenderness: There is no abdominal tenderness.  Musculoskeletal:     Cervical back: Normal range of motion and neck supple.     Right lower leg: Edema  present.     Left lower leg: Edema present.  Skin:    General: Skin is warm and dry.  Neurological:     Mental Status: He is alert.  Psychiatric:        Behavior: Behavior normal.     ED Results / Procedures / Treatments   Labs (all labs ordered are listed, but only abnormal results are displayed) Labs Reviewed  BASIC METABOLIC PANEL - Abnormal; Notable for the following components:      Result Value   BUN 32 (*)    Creatinine, Ser 2.17 (*)    Calcium 8.6 (*)    GFR, Estimated 35 (*)    All other components within normal limits  CBC - Abnormal; Notable for the following components:   WBC 12.9 (*)    All other components within normal limits  BRAIN NATRIURETIC PEPTIDE - Abnormal; Notable for the following components:   B Natriuretic Peptide 2,509.1 (*)    All other components within normal limits  TROPONIN I (HIGH SENSITIVITY) - Abnormal; Notable for the following components:   Troponin I (High Sensitivity) 59 (*)    All other components within normal limits  TROPONIN I (HIGH SENSITIVITY) - Abnormal; Notable for the following components:   Troponin I (High Sensitivity) 48 (*)    All other components within normal limits  RESP PANEL BY RT-PCR (FLU A&B, COVID) [id number]    EKG EKG Interpretation  Date/Time:  Monday June 16 2020 07:26:56 EST Ventricular Rate:  74 PR Interval:  138 QRS Duration: 98 QT Interval:  436 QTC Calculation: 483 R Axis:   -21 Text Interpretation: Normal sinus rhythm T wave abnormality, consider lateral ischemia Prolonged QT Abnormal ECG No acute changes Nonspecific ST and T wave abnormality No significant change since last tracing Confirmed by Varney Biles [phone]) on 06/16/2020 5:47:00 PM   Radiology DG Chest 1 View  Result Date: 06/16/2020 CLINICAL DATA:  Chest pain and short of breath EXAM: CHEST  1 VIEW COMPARISON:  05/15/2020 FINDINGS: Cardiac enlargement without heart failure or edema. Hypoventilation with bibasilar atelectasis/infiltrate.  No significant effusion.  IMPRESSION: Hypoventilation with bibasilar atelectasis/infiltrate. Electronically Signed   By: Franchot Gallo M.D.   On: 06/16/2020 07:54    Procedures Procedures (including critical care time)  Medications Ordered in ED Medications  AeroChamber Plus Flo-Vu Large MISC 1 each (1 each Other Given 06/16/20 1628)  albuterol (VENTOLIN HFA) 108 (90 Base) MCG/ACT inhaler 8 puff (8 puffs Inhalation Given 06/16/20 1628)  furosemide (LASIX) injection 40 mg (40 mg Intravenous Given 06/16/20 1718)    ED Course  I have reviewed the triage vital signs and the nursing notes.  Pertinent labs & imaging results that were available during my care of the patient were reviewed by me and considered in my medical decision making (see chart for details).  Clinical Course as of 06/16/20 1849  Mon Jun 16, 2020  1649 SpO2(!): 88 % [AH]    Clinical Course User Index [AH] [name], PA-C   MDM Rules/Calculators/A&P                          CC:sob/cp VS: BP (!) 148/98   Pulse 84   Temp (!) 97.4 F (36.3 C) (Oral)   Resp (!) 29   SpO2 94%   SP:QZRAQTM is gathered by patient and emr. Previous records obtained and reviewed. DDX:The patient's complaint of sob involves an extensive number of diagnostic and treatment options, and is a complaint that carries with it a high risk of complications, morbidity, and potential mortality. Given the large differential diagnosis, medical decision making is of high complexity. The emergent differential diagnosis for shortness of breath includes, but is not limited to, Pulmonary edema, bronchoconstriction, Pneumonia, Pulmonary embolism, Pneumotherax/ Hemothorax, Dysrythmia, ACS.   Labs: I ordered reviewed and interpreted labs which include CBC which shows mildly elevated white blood cell count of insignificant value, BMP which shows renal insufficiency which appears to be stable.  Respiratory panel is negative for Covid or influenza, troponin  elevated which appears to be chronic and downtrending.  Patient's BNP is markedly elevated to 2000 509 about 1000 points higher than his previous BNP 1 month ago. Imaging: I ordered and reviewed images which included 1 view chest x-ray. I independently visualized and interpreted all imaging. Significant findings include what appears to be a lot bilateral pulmonary edema on my interpretation.  EKG: Sinus rhythm at a rate of 74 without acute ischemic abnormalities.  Mildly prolonged QT Consults: Case discussed with Dr. Trilby Drummer who will admit the patient for TRH. MDM: Patient here with respiratory failure.  He is current requiring 2 L via nasal cannula with initial oxygen saturations 88%.  This appears to be a mixed COPD/CHF exacerbation.  Patient was given 8 puffs of albuterol.  He states that this did make him feel like he was breathing better however he is dyspneic and able to speak in 2 to 3 parts sentences before he becomes labored.  Have ordered IV Lasix 40 mg with strict in and out measurements. Patient disposition:The patient appears reasonably stabilized for admission considering the current resources, flow, and capabilities available in the ED at this time, and I doubt any other [facility] requiring further screening and/or treatment in the ED prior to admission.   SELESTINO NILA was evaluated in Emergency Department on 06/16/2020 for the symptoms described in the history of present illness. He was evaluated in the context of the global COVID-19 pandemic, which necessitated consideration that the patient might be at risk for infection with the SARS-CoV-2 virus that causes COVID-19.  Institutional protocols and algorithms that pertain to the evaluation of patients at risk for COVID-19 are in a state of rapid change based on information released by regulatory bodies including the CDC and federal and state organizations. These policies and algorithms were followed during the patient's care in the  ED.      Final Clinical Impression(s) / ED Diagnoses Final diagnoses:  SOB (shortness of breath)    Rx / DC Orders ED Discharge Orders    None       [name], PA-C 06/16/20 1901    Varney Biles, MD 06/16/20 2044

## 2020-06-16 NOTE — ED Notes (Signed)
Called for vitals no answer °

## 2020-06-16 NOTE — ED Triage Notes (Signed)
Pt via EMS for eval of cp and shob that woke him out of sleep this morning. Hx COPD, CHF, compliant with meds. Room air SpO2 88%. 324 ASA and 2 nitro given PTA without relief. Wears 2L O2 PRN at home.

## 2020-06-17 ENCOUNTER — Observation Stay (HOSPITAL_COMMUNITY): Payer: Medicaid Other

## 2020-06-17 DIAGNOSIS — I10 Essential (primary) hypertension: Secondary | ICD-10-CM | POA: Diagnosis not present

## 2020-06-17 DIAGNOSIS — J441 Chronic obstructive pulmonary disease with (acute) exacerbation: Secondary | ICD-10-CM

## 2020-06-17 DIAGNOSIS — J9621 Acute and chronic respiratory failure with hypoxia: Secondary | ICD-10-CM | POA: Diagnosis present

## 2020-06-17 DIAGNOSIS — F32A Depression, unspecified: Secondary | ICD-10-CM | POA: Diagnosis present

## 2020-06-17 DIAGNOSIS — E782 Mixed hyperlipidemia: Secondary | ICD-10-CM | POA: Diagnosis not present

## 2020-06-17 DIAGNOSIS — Z8739 Personal history of other diseases of the musculoskeletal system and connective tissue: Secondary | ICD-10-CM | POA: Diagnosis not present

## 2020-06-17 DIAGNOSIS — Z20822 Contact with and (suspected) exposure to covid-19: Secondary | ICD-10-CM | POA: Diagnosis present

## 2020-06-17 DIAGNOSIS — K219 Gastro-esophageal reflux disease without esophagitis: Secondary | ICD-10-CM | POA: Diagnosis present

## 2020-06-17 DIAGNOSIS — I2510 Atherosclerotic heart disease of native coronary artery without angina pectoris: Secondary | ICD-10-CM | POA: Diagnosis present

## 2020-06-17 DIAGNOSIS — R079 Chest pain, unspecified: Secondary | ICD-10-CM | POA: Diagnosis not present

## 2020-06-17 DIAGNOSIS — Z5329 Procedure and treatment not carried out because of patient's decision for other reasons: Secondary | ICD-10-CM | POA: Diagnosis not present

## 2020-06-17 DIAGNOSIS — I5043 Acute on chronic combined systolic (congestive) and diastolic (congestive) heart failure: Secondary | ICD-10-CM

## 2020-06-17 DIAGNOSIS — I130 Hypertensive heart and chronic kidney disease with heart failure and stage 1 through stage 4 chronic kidney disease, or unspecified chronic kidney disease: Secondary | ICD-10-CM | POA: Diagnosis present

## 2020-06-17 DIAGNOSIS — Z7982 Long term (current) use of aspirin: Secondary | ICD-10-CM | POA: Diagnosis not present

## 2020-06-17 DIAGNOSIS — I5082 Biventricular heart failure: Secondary | ICD-10-CM | POA: Diagnosis present

## 2020-06-17 DIAGNOSIS — J440 Chronic obstructive pulmonary disease with acute lower respiratory infection: Secondary | ICD-10-CM | POA: Diagnosis present

## 2020-06-17 DIAGNOSIS — Z833 Family history of diabetes mellitus: Secondary | ICD-10-CM | POA: Diagnosis not present

## 2020-06-17 DIAGNOSIS — N1832 Chronic kidney disease, stage 3b: Secondary | ICD-10-CM | POA: Diagnosis not present

## 2020-06-17 DIAGNOSIS — R778 Other specified abnormalities of plasma proteins: Secondary | ICD-10-CM | POA: Diagnosis present

## 2020-06-17 DIAGNOSIS — M109 Gout, unspecified: Secondary | ICD-10-CM | POA: Diagnosis present

## 2020-06-17 DIAGNOSIS — R0602 Shortness of breath: Secondary | ICD-10-CM | POA: Diagnosis not present

## 2020-06-17 DIAGNOSIS — Z8249 Family history of ischemic heart disease and other diseases of the circulatory system: Secondary | ICD-10-CM | POA: Diagnosis not present

## 2020-06-17 DIAGNOSIS — F1410 Cocaine abuse, uncomplicated: Secondary | ICD-10-CM | POA: Diagnosis present

## 2020-06-17 DIAGNOSIS — E1122 Type 2 diabetes mellitus with diabetic chronic kidney disease: Secondary | ICD-10-CM | POA: Diagnosis not present

## 2020-06-17 DIAGNOSIS — J189 Pneumonia, unspecified organism: Secondary | ICD-10-CM

## 2020-06-17 DIAGNOSIS — N183 Chronic kidney disease, stage 3 unspecified: Secondary | ICD-10-CM | POA: Diagnosis present

## 2020-06-17 DIAGNOSIS — Z23 Encounter for immunization: Secondary | ICD-10-CM | POA: Diagnosis not present

## 2020-06-17 DIAGNOSIS — F17210 Nicotine dependence, cigarettes, uncomplicated: Secondary | ICD-10-CM | POA: Diagnosis present

## 2020-06-17 DIAGNOSIS — Z9981 Dependence on supplemental oxygen: Secondary | ICD-10-CM | POA: Diagnosis not present

## 2020-06-17 LAB — COMPREHENSIVE METABOLIC PANEL
ALT: 36 U/L (ref 0–44)
AST: 18 U/L (ref 15–41)
Albumin: 2.6 g/dL — ABNORMAL LOW (ref 3.5–5.0)
Alkaline Phosphatase: 82 U/L (ref 38–126)
Anion gap: 11 (ref 5–15)
BUN: 31 mg/dL — ABNORMAL HIGH (ref 6–20)
CO2: 20 mmol/L — ABNORMAL LOW (ref 22–32)
Calcium: 8.2 mg/dL — ABNORMAL LOW (ref 8.9–10.3)
Chloride: 103 mmol/L (ref 98–111)
Creatinine, Ser: 1.95 mg/dL — ABNORMAL HIGH (ref 0.61–1.24)
GFR, Estimated: 39 mL/min — ABNORMAL LOW (ref >60)
Glucose, Bld: 162 mg/dL — ABNORMAL HIGH (ref 70–99)
Potassium: 4.9 mmol/L (ref 3.5–5.1)
Sodium: 134 mmol/L — ABNORMAL LOW (ref 135–145)
Total Bilirubin: 0.4 mg/dL (ref 0.3–1.2)
Total Protein: 5.6 g/dL — ABNORMAL LOW (ref 6.5–8.1)

## 2020-06-17 LAB — CBC
HCT: 45.3 % (ref 39.0–52.0)
Hemoglobin: 14.4 g/dL (ref 13.0–17.0)
MCH: 29.9 pg (ref 26.0–34.0)
MCHC: 31.8 g/dL (ref 30.0–36.0)
MCV: 94 fL (ref 80.0–100.0)
Platelets: 232 10*3/uL (ref 150–400)
RBC: 4.82 MIL/uL (ref 4.22–5.81)
RDW: 14.3 % (ref 11.5–15.5)
WBC: 11.6 10*3/uL — ABNORMAL HIGH (ref 4.0–10.5)
nRBC: 0 % (ref 0.0–0.2)

## 2020-06-17 LAB — PROCALCITONIN: Procalcitonin: 0.23 ng/mL

## 2020-06-17 LAB — GLUCOSE, CAPILLARY
Glucose-Capillary: 115 mg/dL — ABNORMAL HIGH (ref 70–99)
Glucose-Capillary: 159 mg/dL — ABNORMAL HIGH (ref 70–99)
Glucose-Capillary: 124 mg/dL — ABNORMAL HIGH (ref 70–99)
Glucose-Capillary: 137 mg/dL — ABNORMAL HIGH (ref 70–99)

## 2020-06-17 LAB — ECHOCARDIOGRAM COMPLETE
Area-P 1/2: 3.42 cm2
Height: 72 in
S' Lateral: 5.7 cm
Weight: 4230.4 oz

## 2020-06-17 LAB — BRAIN NATRIURETIC PEPTIDE: B Natriuretic Peptide: 2736.3 pg/mL — ABNORMAL HIGH (ref 0.0–100.0)

## 2020-06-17 MED ORDER — ZOLPIDEM TARTRATE 5 MG PO TABS
5.0000 mg | ORAL_TABLET | Freq: Every evening | ORAL | Status: AC | PRN
  Administered 2020-06-17: 5 mg via ORAL
  Filled 2020-06-17: qty 1

## 2020-06-17 MED ORDER — GUAIFENESIN-DM 100-10 MG/5ML PO SYRP: 5.0000 mL | ORAL_SOLUTION | ORAL | Status: DC | PRN

## 2020-06-17 MED ORDER — INFLUENZA VAC SPLIT QUAD 0.5 ML IM SUSY
0.5000 mL | PREFILLED_SYRINGE | INTRAMUSCULAR | Status: AC
  Administered 2020-06-18: 0.5 mL via INTRAMUSCULAR
  Filled 2020-06-17: qty 0.5

## 2020-06-17 MED ORDER — GUAIFENESIN 100 MG/5ML PO SOLN
5.0000 mL | Freq: Once | ORAL | Status: AC
  Administered 2020-06-17: 100 mg via ORAL
  Filled 2020-06-17: qty 25

## 2020-06-17 MED ORDER — MELATONIN 3 MG PO TABS
3.0000 mg | ORAL_TABLET | Freq: Every evening | ORAL | Status: DC | PRN
  Filled 2020-06-17: qty 1

## 2020-06-17 MED ORDER — MENTHOL 3 MG MT LOZG
1.0000 | LOZENGE | OROMUCOSAL | Status: DC | PRN
  Administered 2020-06-17: 3 mg via ORAL
  Filled 2020-06-17: qty 9

## 2020-06-17 MED ORDER — MAGNESIUM SULFATE IN D5W 1-5 GM/100ML-% IV SOLN
1.0000 g | Freq: Once | INTRAVENOUS | Status: AC
  Administered 2020-06-17: 1 g via INTRAVENOUS
  Filled 2020-06-17 (×2): qty 100

## 2020-06-17 NOTE — Plan of Care (Signed)
?  Problem: Activity: ?Goal: Capacity to carry out activities will improve ?Outcome: Progressing ?  ?

## 2020-06-17 NOTE — Plan of Care (Signed)
  Problem: Activity: Goal: Capacity to carry out activities will improve Outcome: Progressing   Problem: Education: Goal: Knowledge of General Education information will improve Description: Including pain rating scale, medication(s)/side effects and non-pharmacologic comfort measures Outcome: Progressing   Problem: Safety: Goal: Ability to remain free from injury will improve Outcome: Progressing

## 2020-06-17 NOTE — Progress Notes (Signed)
  Echocardiogram 2D Echocardiogram has been performed.  Pieter Partridge 06/17/2020, 3:52 PM

## 2020-06-17 NOTE — Progress Notes (Signed)
PROGRESS NOTE  Chris [name] ZOX:[id number] DOB: 1961-03-13 DOA: 06/16/2020 PCP: [name], MD  Brief History   Chris [name] is a 60 y.o. 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             Patient states he has had several days of worsening shortness of breath he has tried some albuterol at home without relief.  He has some shortness of breath at rest and it worsens with exertion.  He is on 2 L chronically at home and remains on this in ED.             He reports that today he had 10 seconds of a sharp chest pain that went across his chest and scared him prompting him to be evaluated in ED.  He is under significant stress and did have a court date today of note, the EDP updated the DA to inform him that he was in the hospital.             He states that he has been taking his medications regularly he is not sure what they are called but he takes about 9 pills in the morning which aligns with his med list.  He denies significant increase in fluids or salt he does state he has been having a lot of [place] recently based on what he can afford.  He states his weight fluctuates is not sure if it is up or down right now.              He reports some diarrhea in the past week but this is resolved at this time.  He also reports increased sputum production which is whitish.  He also reports pain in his feet which feels like his typical gout flare.  Patient denies fevers, chills, abdominal pain, nausea, vomiting, constipation.  ED Course: Vitals in ED significant for blood pressure in the 140s 150s systolic and respiratory rate in the 20s.  Lab work-up showed BMP with creatinine of 2.7 which is near baseline of 1.9.  Calcium 8.6.  CBC showed leukocytosis of 12.9.  Troponin downtrending and chronic leg elevated  here measurements were 59 and 48.  BNP elevated to 2500.  Respiratory panel for flu and Covid negative.  Patient received dose of Lasix and inhaler treatments in the ED.  [facility] were consulted to admit this patient for further evaluation and treatment.  He was admitted to a telemetry bed, and started on diuresis. Echocardiogram was ordered. The patient is 2.6 liters negative in terms of fluid balance this morning.  Consultants  . None  Procedures  . None  Antibiotics   Anti-infectives (From admission, onward)   Start     Dose/Rate Route Frequency Ordered Stop   06/17/20 1945  azithromycin (ZITHROMAX) 250 mg in dextrose 5 % 125 mL IVPB       "Followed by" Linked Group Details   250 mg 125 mL/hr over 60 Minutes Intravenous Every 24 hours 06/16/20 1934 06/20/20 2359   06/16/20 1945  cefTRIAXone (ROCEPHIN) 1 g in sodium chloride 0.9 % 100 mL IVPB        1 g 200 mL/hr over 30 Minutes Intravenous Every 24 hours 06/16/20 1934 06/20/20 2359   06/16/20 1945  azithromycin (ZITHROMAX) 500 mg in sodium chloride 0.9 % 250 mL IVPB       "  Followed by" Linked Group Details   500 mg 250 mL/hr over 60 Minutes Intravenous  Once 06/16/20 1934 06/17/20 0027    .   Subjective  The patient is sitting up on the edge of his bed. He states that he is feeling better and wants to go home.  Objective   Vitals:  Vitals:   06/17/20 1217 06/17/20 1430  BP: 111/80   Pulse: 71   Resp: 18   Temp: 97.8 F (36.6 C)   SpO2: 99% 97%    Exam:  Constitutional:  . The patient is awake, alert, and oriented x 3. No acute distress. Respiratory:  . No increased work of breathing. . No wheezes, rales, or rhonchi . No tactile fremitus Cardiovascular:  . Regular rate and rhythm . No murmurs, ectopy, or gallups. . No lateral PMI. No thrills. Abdomen:  . Abdomen is soft, non-tender, non-distended . No hernias, masses, or organomegaly . Normoactive bowel sounds.  Musculoskeletal:  . No  cyanosis oir clubbing . +2 pitting edema of lower extremities bilaterally Skin:  . No rashes, lesions, ulcers . palpation of skin: no induration or nodules Neurologic:  . CN 2-12 intact . Sensation all 4 extremities intact Psychiatric:  . Mental status o Mood, affect appropriate o Orientation to person, place, time  . judgment and insight appear intact  I have personally reviewed the following:   Today's Data  . Vitals, I's and O's, CMP, BNP, procalcitonin, CBC  Imaging  . CXR 06/16/2020  Cardiology Data  . EKG . Echocardiogram: pending.  Scheduled Meds: . allopurinol  200 mg Oral Daily  . aspirin EC  81 mg Oral Daily  . atorvastatin  40 mg Oral q1800  . carvedilol  25 mg Oral BID WC  . enoxaparin (LOVENOX) injection  40 mg Subcutaneous Q24H  . furosemide  40 mg Intravenous BID  . guaiFENesin  5 mL Oral Once  . [START ON 06/18/2020] influenza vac split quadrivalent PF  0.5 mL Intramuscular Tomorrow-1000  . insulin aspart  0-15 Units Subcutaneous TID WC  . ipratropium-albuterol  3 mL Nebulization Q6H WA  . isosorbide-hydrALAZINE  1 tablet Oral TID  . mometasone-formoterol  2 puff Inhalation BID  . montelukast  10 mg Oral QHS  . predniSONE  40 mg Oral Q breakfast  . sacubitril-valsartan  1 tablet Oral BID  . sodium chloride flush  3 mL Intravenous Q12H   Continuous Infusions: . azithromycin    . cefTRIAXone (ROCEPHIN)  IV Stopped (06/16/20 2202)    Principal Problem:   Acute exacerbation of CHF (congestive heart failure) (HCC) Active Problems:   Mixed hyperlipidemia   Essential hypertension   CKD (chronic kidney disease) stage 3, GFR 30-59 ml/min (HCC)   CAP (community acquired pneumonia)   History of gout   Type 2 diabetes mellitus with stage 3 chronic kidney disease (HCC)   COPD with acute exacerbation (HCC)   LOS: 0 days    A & P CHF exacerbation: Last EF in July 2021 was 30 to 35% with global hypokinesis. Echocardiogram has been repeated. Report is  pending. > BNP elevated to greater than 2700 which is thousand more than previous elevations, and 200 more than last night despite being 2600 cc negative in terms of fluid balance. The patient is now saturating in the 90's on room air, although he continues to have pitting edema of the lower extremities. Upon admission, the patient reports he is taking his medication daily and has not had significant increase in  his salt or fluid intake. He is being diuresed with lasix 40 mg bid, and he has been continued on carvedilol, bidil, and entresto as at home. Creatinine is stable at 1.95. It was 1.87 on 05/23/20.Will follow BNP and volume status. Depending on results of echo may consult heart failure team in the am.  COPD exacerbation: The patient is on 2 L chronic O2 at home. He was requiring 2 L in ED, although he was tachypneic. He is now saturating at 94% on room air. He is receiving prednisone orally, symbicort, dulera, and duonebs q 6 hours. There is also albuterol available on an as needed basis.  CAP: No evidence for CAP. Abnormality on cXW was likely due to atelectasis. Procalcitonin was 0.23. Will stop antibiotic coverage. WBC is down to 11.6. likely reactive and due to steroids. No fevers.  CKD 3: Creatinine 2.17 on admission which was near baseline of 1.9. Creatinine is 1.95 today. Monitor creatinine, electrolytes, and volume status. Avoid nephrotoxic agents and hypotension.  Hypertension: Systolic blood pressures are normal, although diastolics are elevated. Continue carvedilol, BiDil, and Entresto as at home. Continue diuresis.  Hyperlipidemia: Continue home atorvastatin  Diabetes:Monitor for glucose increases while on steroids. Folow sugars with FSBS and SSI.  Gout: On allopurinol at home, but here the patient is complaining of pain similar to previous flares. He will be receiving steroids for COPD as above which should treat gout as well. Continue allopurinol.  I have seen and  examined this patient myself. I have spent 35 minutes in his evaluation and care.  DVT prophylaxis:      Lovenox  Code Status:              Full  Family Communication:       None on admission Disposition Plan:              Patient is from:                        Home             Anticipated DC to:                   Home             Anticipated DC date:               1 to 4 days             Anticipated DC barriers:         None     Deaveon Schoen, DO [facility] Direct contact: see [email or web address]  7PM-7AM contact night coverage as above 06/17/2020, 4:33 PM  LOS: 0 days

## 2020-06-17 NOTE — Progress Notes (Signed)
Pt does not c/o any pain. He ambulates in the room without any assistance. Pt did c/o a cough and was treated with a pharmacological intervention. Pt's appetite and oral intake is adequate. Pt did not have any diagnostic procedure done today. Pt's needs are met and safety measures are maintained.

## 2020-06-17 NOTE — Progress Notes (Signed)
Pt. Had 7 beat run of Vtach at 1906. Pt. Asleep at this time. Pt. Now up and alert and requesting to leave AMA. After speaking with pt. About care while in the hospital, pt. Agrees to stay. Pt. Now requesting sleep medication to help him sleep. On call for [facility] paged to make aware.

## 2020-06-18 DIAGNOSIS — N1832 Chronic kidney disease, stage 3b: Secondary | ICD-10-CM | POA: Diagnosis not present

## 2020-06-18 DIAGNOSIS — R0602 Shortness of breath: Secondary | ICD-10-CM

## 2020-06-18 DIAGNOSIS — I10 Essential (primary) hypertension: Secondary | ICD-10-CM | POA: Diagnosis not present

## 2020-06-18 DIAGNOSIS — J441 Chronic obstructive pulmonary disease with (acute) exacerbation: Secondary | ICD-10-CM | POA: Diagnosis not present

## 2020-06-18 DIAGNOSIS — I5043 Acute on chronic combined systolic (congestive) and diastolic (congestive) heart failure: Secondary | ICD-10-CM | POA: Diagnosis not present

## 2020-06-18 LAB — BASIC METABOLIC PANEL
Anion gap: 10 (ref 5–15)
BUN: 38 mg/dL — ABNORMAL HIGH (ref 6–20)
CO2: 24 mmol/L (ref 22–32)
Calcium: 8.2 mg/dL — ABNORMAL LOW (ref 8.9–10.3)
Chloride: 101 mmol/L (ref 98–111)
Creatinine, Ser: 2.14 mg/dL — ABNORMAL HIGH (ref 0.61–1.24)
GFR, Estimated: 35 mL/min — ABNORMAL LOW (ref >60)
Glucose, Bld: 139 mg/dL — ABNORMAL HIGH (ref 70–99)
Potassium: 4.3 mmol/L (ref 3.5–5.1)
Sodium: 135 mmol/L (ref 135–145)

## 2020-06-18 LAB — MAGNESIUM: Magnesium: 2 mg/dL (ref 1.7–2.4)

## 2020-06-18 LAB — BRAIN NATRIURETIC PEPTIDE: B Natriuretic Peptide: 2267.5 pg/mL — ABNORMAL HIGH (ref 0.0–100.0)

## 2020-06-18 LAB — GLUCOSE, CAPILLARY: Glucose-Capillary: 135 mg/dL — ABNORMAL HIGH (ref 70–99)

## 2020-06-18 LAB — PROCALCITONIN: Procalcitonin: 0.25 ng/mL

## 2020-06-18 MED ORDER — IPRATROPIUM-ALBUTEROL 0.5-2.5 (3) MG/3ML IN SOLN
3.0000 mL | Freq: Two times a day (BID) | RESPIRATORY_TRACT | Status: DC
  Administered 2020-06-18: 3 mL via RESPIRATORY_TRACT
  Filled 2020-06-18: qty 3

## 2020-06-18 MED ORDER — PHENOL 1.4 % MT LIQD: 1.0000 | OROMUCOSAL | Status: DC | PRN

## 2020-06-18 NOTE — Progress Notes (Signed)
Pt. Requesting cough drops. On call for [facility] paged to make aware. See new orders.

## 2020-06-18 NOTE — Progress Notes (Signed)
Pt. Requesting med for sleep. On call for [facility] paged to make aware.  

## 2020-06-18 NOTE — Consult Note (Signed)
[facility] Inpatient Consult   06/18/2020  WILLIM BREGMAN 11-21-1960 [id number]  Managed Medicaid  Notified the Managed Medicaid team of member leaving AMA.  Charlesetta Shanks, RN BSN CCM [facility]  [phone] business mobile phone Toll free office [phone]  Fax number: [phone] [name]

## 2020-06-18 NOTE — Progress Notes (Signed)
Pt. Requesting multiple fluids and food items during the night. Pt. Reminded of fluid restriction multiple times with continued request for drinks.  Pt. Telemetry leads replaced replaced multiple times as pt.would not keep them on during the night. CCMD aware.

## 2020-06-18 NOTE — Progress Notes (Signed)
Pt. Requesting to have room cleaned by housekeeping. RN in the room with broom to sweep pts. Floor. Pt. Insisted he sweep the floor.

## 2020-06-18 NOTE — Progress Notes (Signed)
Pt requested this nurse speak with MD and get a time estimation on rounds. After speaking with MD, informed pt about the echo results and cardiology consult and that primary MD would be rounding on pt. Emphasized the health concerns to the pt.  Pt stated he had to go home and "take care of shit". This nurse states that he would need to leave AMA and explained what that meant along with signing form. Pt stated that he "wouldn't be signing shit". This nurse called for a witness to confirm he woudln't sign the paperwork and pt became immediately agitated, stood up to walk away. This nurse put hand on his IV site and said "I need to remove your IV" and pt said "You better not put your hands on me!". Lafonda Mosses, RN charge nurse arrived and was able to remove IV as well as Dr Gerri Lins arriving to room. At this time pt requested this nurse leave.

## 2020-06-18 NOTE — Progress Notes (Signed)
Pt. Requesting more medication for throat. Standing order place for med.

## 2020-06-18 NOTE — Progress Notes (Signed)
Pt did AMA and did not sign the AMA document as well.

## 2020-06-18 NOTE — Discharge Summary (Signed)
MG tablet Commonly known as: LASIX Take 1.5 tablets (60 mg total) by mouth daily.   glipiZIDE 5 MG tablet Commonly known as: GLUCOTROL Take 0.5 tablets (2.5 mg total) by mouth 2 (two) times daily before a meal.   isosorbide-hydrALAZINE 20-37.5 MG tablet Commonly known as: BIDIL Take 1 tablet by mouth 3 (three) times daily.   Lancing Device Misc 1 Device by Does not apply route daily.   montelukast 10 MG tablet Commonly known as: SINGULAIR Take 1 tablet (10 mg total) by mouth at bedtime.   sacubitril-valsartan 24-26 MG Commonly known as: ENTRESTO Take 1 tablet by mouth 2 (two) times daily.   True Metrix Blood Glucose Test test strip Generic drug: glucose blood Use as instructed   Accu-Chek Guide test strip Generic drug: glucose blood Use as instructed. Check blood glucose by fingerstick once per day.   TRUEplus Lancets 28G Misc Use as directed      No Known Allergies  The results of significant diagnostics from this hospitalization (including imaging, microbiology, ancillary and laboratory) are listed below for reference.    Significant Diagnostic Studies: DG Chest 1 View  Result Date: 06/16/2020 CLINICAL DATA:  Chest pain and short of breath  EXAM: CHEST  1 VIEW COMPARISON:  05/15/2020 FINDINGS: Cardiac enlargement without heart failure or edema. Hypoventilation with bibasilar atelectasis/infiltrate. No significant effusion. IMPRESSION: Hypoventilation with bibasilar atelectasis/infiltrate. Electronically Signed   By: Franchot Gallo M.D.   On: 06/16/2020 07:54   ECHOCARDIOGRAM COMPLETE  Result Date: 06/17/2020    ECHOCARDIOGRAM REPORT   Patient Name:   Chris [name] Date of Exam: 06/17/2020 Medical Rec #:  [id number]       Height:       72.0 in Accession #:    [id number]      Weight:       264.4 lb Date of Birth:  [birth year]       BSA:          2.399 m Patient Age:    60 years        BP:           111/80 mmHg Patient Gender: M               HR:           71 bpm. Exam Location:  Inpatient Procedure: 2D Echo, Cardiac Doppler and Color Doppler Indications:    CHF  History:        Patient has prior history of Echocardiogram examinations, most                 recent 12/25/2019. CHF, CAD, COPD, Signs/Symptoms:Chest Pain;                 Risk Factors:Hypertension, Diabetes, Obesity and Current Smoker.  Sonographer:    Dustin Flock Referring Phys: [id number] [place]  1. Left ventricular ejection fraction, by estimation, is 20 to 25%. The left ventricle has severely decreased function. The left ventricle demonstrates global hypokinesis. The left ventricular internal cavity size was mildly to moderately dilated. There  is mild concentric left ventricular hypertrophy. Left ventricular diastolic parameters are consistent with Grade III diastolic dysfunction (restrictive). Elevated left atrial pressure.  2. Right ventricular systolic function is moderately reduced. The right ventricular size is moderately enlarged. There is mildly elevated pulmonary artery systolic pressure. The estimated right ventricular systolic pressure is 91.6 mmHg.  3. Left atrial size was mildly dilated.  4. Right atrial size was mildly dilated.  MG tablet Commonly known as: LASIX Take 1.5 tablets (60 mg total) by mouth daily.   glipiZIDE 5 MG tablet Commonly known as: GLUCOTROL Take 0.5 tablets (2.5 mg total) by mouth 2 (two) times daily before a meal.   isosorbide-hydrALAZINE 20-37.5 MG tablet Commonly known as: BIDIL Take 1 tablet by mouth 3 (three) times daily.   Lancing Device Misc 1 Device by Does not apply route daily.   montelukast 10 MG tablet Commonly known as: SINGULAIR Take 1 tablet (10 mg total) by mouth at bedtime.   sacubitril-valsartan 24-26 MG Commonly known as: ENTRESTO Take 1 tablet by mouth 2 (two) times daily.   True Metrix Blood Glucose Test test strip Generic drug: glucose blood Use as instructed   Accu-Chek Guide test strip Generic drug: glucose blood Use as instructed. Check blood glucose by fingerstick once per day.   TRUEplus Lancets 28G Misc Use as directed      No Known Allergies  The results of significant diagnostics from this hospitalization (including imaging, microbiology, ancillary and laboratory) are listed below for reference.    Significant Diagnostic Studies: DG Chest 1 View  Result Date: 06/16/2020 CLINICAL DATA:  Chest pain and short of breath  EXAM: CHEST  1 VIEW COMPARISON:  05/15/2020 FINDINGS: Cardiac enlargement without heart failure or edema. Hypoventilation with bibasilar atelectasis/infiltrate. No significant effusion. IMPRESSION: Hypoventilation with bibasilar atelectasis/infiltrate. Electronically Signed   By: Franchot Gallo M.D.   On: 06/16/2020 07:54   ECHOCARDIOGRAM COMPLETE  Result Date: 06/17/2020    ECHOCARDIOGRAM REPORT   Patient Name:   Chris [name] Date of Exam: 06/17/2020 Medical Rec #:  [id number]       Height:       72.0 in Accession #:    [id number]      Weight:       264.4 lb Date of Birth:  [birth year]       BSA:          2.399 m Patient Age:    60 years        BP:           111/80 mmHg Patient Gender: M               HR:           71 bpm. Exam Location:  Inpatient Procedure: 2D Echo, Cardiac Doppler and Color Doppler Indications:    CHF  History:        Patient has prior history of Echocardiogram examinations, most                 recent 12/25/2019. CHF, CAD, COPD, Signs/Symptoms:Chest Pain;                 Risk Factors:Hypertension, Diabetes, Obesity and Current Smoker.  Sonographer:    Dustin Flock Referring Phys: [id number] [place]  1. Left ventricular ejection fraction, by estimation, is 20 to 25%. The left ventricle has severely decreased function. The left ventricle demonstrates global hypokinesis. The left ventricular internal cavity size was mildly to moderately dilated. There  is mild concentric left ventricular hypertrophy. Left ventricular diastolic parameters are consistent with Grade III diastolic dysfunction (restrictive). Elevated left atrial pressure.  2. Right ventricular systolic function is moderately reduced. The right ventricular size is moderately enlarged. There is mildly elevated pulmonary artery systolic pressure. The estimated right ventricular systolic pressure is 91.6 mmHg.  3. Left atrial size was mildly dilated.  4. Right atrial size was mildly dilated.  MG tablet Commonly known as: LASIX Take 1.5 tablets (60 mg total) by mouth daily.   glipiZIDE 5 MG tablet Commonly known as: GLUCOTROL Take 0.5 tablets (2.5 mg total) by mouth 2 (two) times daily before a meal.   isosorbide-hydrALAZINE 20-37.5 MG tablet Commonly known as: BIDIL Take 1 tablet by mouth 3 (three) times daily.   Lancing Device Misc 1 Device by Does not apply route daily.   montelukast 10 MG tablet Commonly known as: SINGULAIR Take 1 tablet (10 mg total) by mouth at bedtime.   sacubitril-valsartan 24-26 MG Commonly known as: ENTRESTO Take 1 tablet by mouth 2 (two) times daily.   True Metrix Blood Glucose Test test strip Generic drug: glucose blood Use as instructed   Accu-Chek Guide test strip Generic drug: glucose blood Use as instructed. Check blood glucose by fingerstick once per day.   TRUEplus Lancets 28G Misc Use as directed      No Known Allergies  The results of significant diagnostics from this hospitalization (including imaging, microbiology, ancillary and laboratory) are listed below for reference.    Significant Diagnostic Studies: DG Chest 1 View  Result Date: 06/16/2020 CLINICAL DATA:  Chest pain and short of breath  EXAM: CHEST  1 VIEW COMPARISON:  05/15/2020 FINDINGS: Cardiac enlargement without heart failure or edema. Hypoventilation with bibasilar atelectasis/infiltrate. No significant effusion. IMPRESSION: Hypoventilation with bibasilar atelectasis/infiltrate. Electronically Signed   By: Franchot Gallo M.D.   On: 06/16/2020 07:54   ECHOCARDIOGRAM COMPLETE  Result Date: 06/17/2020    ECHOCARDIOGRAM REPORT   Patient Name:   Chris [name] Date of Exam: 06/17/2020 Medical Rec #:  [id number]       Height:       72.0 in Accession #:    [id number]      Weight:       264.4 lb Date of Birth:  [birth year]       BSA:          2.399 m Patient Age:    60 years        BP:           111/80 mmHg Patient Gender: M               HR:           71 bpm. Exam Location:  Inpatient Procedure: 2D Echo, Cardiac Doppler and Color Doppler Indications:    CHF  History:        Patient has prior history of Echocardiogram examinations, most                 recent 12/25/2019. CHF, CAD, COPD, Signs/Symptoms:Chest Pain;                 Risk Factors:Hypertension, Diabetes, Obesity and Current Smoker.  Sonographer:    Dustin Flock Referring Phys: [id number] [place]  1. Left ventricular ejection fraction, by estimation, is 20 to 25%. The left ventricle has severely decreased function. The left ventricle demonstrates global hypokinesis. The left ventricular internal cavity size was mildly to moderately dilated. There  is mild concentric left ventricular hypertrophy. Left ventricular diastolic parameters are consistent with Grade III diastolic dysfunction (restrictive). Elevated left atrial pressure.  2. Right ventricular systolic function is moderately reduced. The right ventricular size is moderately enlarged. There is mildly elevated pulmonary artery systolic pressure. The estimated right ventricular systolic pressure is 91.6 mmHg.  3. Left atrial size was mildly dilated.  4. Right atrial size was mildly dilated.  MG tablet Commonly known as: LASIX Take 1.5 tablets (60 mg total) by mouth daily.   glipiZIDE 5 MG tablet Commonly known as: GLUCOTROL Take 0.5 tablets (2.5 mg total) by mouth 2 (two) times daily before a meal.   isosorbide-hydrALAZINE 20-37.5 MG tablet Commonly known as: BIDIL Take 1 tablet by mouth 3 (three) times daily.   Lancing Device Misc 1 Device by Does not apply route daily.   montelukast 10 MG tablet Commonly known as: SINGULAIR Take 1 tablet (10 mg total) by mouth at bedtime.   sacubitril-valsartan 24-26 MG Commonly known as: ENTRESTO Take 1 tablet by mouth 2 (two) times daily.   True Metrix Blood Glucose Test test strip Generic drug: glucose blood Use as instructed   Accu-Chek Guide test strip Generic drug: glucose blood Use as instructed. Check blood glucose by fingerstick once per day.   TRUEplus Lancets 28G Misc Use as directed      No Known Allergies  The results of significant diagnostics from this hospitalization (including imaging, microbiology, ancillary and laboratory) are listed below for reference.    Significant Diagnostic Studies: DG Chest 1 View  Result Date: 06/16/2020 CLINICAL DATA:  Chest pain and short of breath  EXAM: CHEST  1 VIEW COMPARISON:  05/15/2020 FINDINGS: Cardiac enlargement without heart failure or edema. Hypoventilation with bibasilar atelectasis/infiltrate. No significant effusion. IMPRESSION: Hypoventilation with bibasilar atelectasis/infiltrate. Electronically Signed   By: Franchot Gallo M.D.   On: 06/16/2020 07:54   ECHOCARDIOGRAM COMPLETE  Result Date: 06/17/2020    ECHOCARDIOGRAM REPORT   Patient Name:   Chris [name] Date of Exam: 06/17/2020 Medical Rec #:  [id number]       Height:       72.0 in Accession #:    [id number]      Weight:       264.4 lb Date of Birth:  [birth year]       BSA:          2.399 m Patient Age:    60 years        BP:           111/80 mmHg Patient Gender: M               HR:           71 bpm. Exam Location:  Inpatient Procedure: 2D Echo, Cardiac Doppler and Color Doppler Indications:    CHF  History:        Patient has prior history of Echocardiogram examinations, most                 recent 12/25/2019. CHF, CAD, COPD, Signs/Symptoms:Chest Pain;                 Risk Factors:Hypertension, Diabetes, Obesity and Current Smoker.  Sonographer:    Dustin Flock Referring Phys: [id number] [place]  1. Left ventricular ejection fraction, by estimation, is 20 to 25%. The left ventricle has severely decreased function. The left ventricle demonstrates global hypokinesis. The left ventricular internal cavity size was mildly to moderately dilated. There  is mild concentric left ventricular hypertrophy. Left ventricular diastolic parameters are consistent with Grade III diastolic dysfunction (restrictive). Elevated left atrial pressure.  2. Right ventricular systolic function is moderately reduced. The right ventricular size is moderately enlarged. There is mildly elevated pulmonary artery systolic pressure. The estimated right ventricular systolic pressure is 91.6 mmHg.  3. Left atrial size was mildly dilated.  4. Right atrial size was mildly dilated.  MG tablet Commonly known as: LASIX Take 1.5 tablets (60 mg total) by mouth daily.   glipiZIDE 5 MG tablet Commonly known as: GLUCOTROL Take 0.5 tablets (2.5 mg total) by mouth 2 (two) times daily before a meal.   isosorbide-hydrALAZINE 20-37.5 MG tablet Commonly known as: BIDIL Take 1 tablet by mouth 3 (three) times daily.   Lancing Device Misc 1 Device by Does not apply route daily.   montelukast 10 MG tablet Commonly known as: SINGULAIR Take 1 tablet (10 mg total) by mouth at bedtime.   sacubitril-valsartan 24-26 MG Commonly known as: ENTRESTO Take 1 tablet by mouth 2 (two) times daily.   True Metrix Blood Glucose Test test strip Generic drug: glucose blood Use as instructed   Accu-Chek Guide test strip Generic drug: glucose blood Use as instructed. Check blood glucose by fingerstick once per day.   TRUEplus Lancets 28G Misc Use as directed      No Known Allergies  The results of significant diagnostics from this hospitalization (including imaging, microbiology, ancillary and laboratory) are listed below for reference.    Significant Diagnostic Studies: DG Chest 1 View  Result Date: 06/16/2020 CLINICAL DATA:  Chest pain and short of breath  EXAM: CHEST  1 VIEW COMPARISON:  05/15/2020 FINDINGS: Cardiac enlargement without heart failure or edema. Hypoventilation with bibasilar atelectasis/infiltrate. No significant effusion. IMPRESSION: Hypoventilation with bibasilar atelectasis/infiltrate. Electronically Signed   By: Franchot Gallo M.D.   On: 06/16/2020 07:54   ECHOCARDIOGRAM COMPLETE  Result Date: 06/17/2020    ECHOCARDIOGRAM REPORT   Patient Name:   Chris [name] Date of Exam: 06/17/2020 Medical Rec #:  [id number]       Height:       72.0 in Accession #:    [id number]      Weight:       264.4 lb Date of Birth:  [birth year]       BSA:          2.399 m Patient Age:    60 years        BP:           111/80 mmHg Patient Gender: M               HR:           71 bpm. Exam Location:  Inpatient Procedure: 2D Echo, Cardiac Doppler and Color Doppler Indications:    CHF  History:        Patient has prior history of Echocardiogram examinations, most                 recent 12/25/2019. CHF, CAD, COPD, Signs/Symptoms:Chest Pain;                 Risk Factors:Hypertension, Diabetes, Obesity and Current Smoker.  Sonographer:    Dustin Flock Referring Phys: [id number] [place]  1. Left ventricular ejection fraction, by estimation, is 20 to 25%. The left ventricle has severely decreased function. The left ventricle demonstrates global hypokinesis. The left ventricular internal cavity size was mildly to moderately dilated. There  is mild concentric left ventricular hypertrophy. Left ventricular diastolic parameters are consistent with Grade III diastolic dysfunction (restrictive). Elevated left atrial pressure.  2. Right ventricular systolic function is moderately reduced. The right ventricular size is moderately enlarged. There is mildly elevated pulmonary artery systolic pressure. The estimated right ventricular systolic pressure is 91.6 mmHg.  3. Left atrial size was mildly dilated.  4. Right atrial size was mildly dilated.  Physician Discharge Summary  TREVIONNE ADVANI EXH:[id number] DOB: 18-Feb-1961 DOA: 06/16/2020  PCP: [name], MD  Admit date: 06/16/2020 Discharge date: 06/18/2020  Recommendations for Outpatient Follow-up:  1. Patient left AMA  Discharge Diagnoses: Principal diagnosis  1. Acute exacerbation of worsening systolic diastolic and right heart failure 2. Peripheral edema 3. Acute hypoxic respiratory failure due to volume overload. 4. COPD 5. CAP 6. CKD 3 7. Hypertension 8. DM II 9. Gout  Discharge Condition: Poor  Disposition: AMA  Diet recommendation: AMA  Filed Weights   06/17/20 0017 06/18/20 0555  Weight: 119.9 kg 121.8 kg    History of present illness:  Chris [name] is a 60 y.o. 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             Patient states he has had several days of worsening shortness of breath he has tried some albuterol at home without relief.  He has some shortness of breath at rest and it worsens with exertion.  He is on 2 L chronically at home and remains on this in ED.             He reports that today he had 10 seconds of a sharp chest pain that went across his chest and scared him prompting him to be evaluated in ED.  He is under significant stress and did have a court date today of note, the EDP updated the DA to inform him that he was in the hospital.             He states that he has been taking his medications regularly he is not sure what they are called but he takes about 9 pills in the morning which aligns with his med list.  He denies significant increase in fluids or salt he does state he has been having a lot of [place] recently based on what he can afford.  He states his weight fluctuates is not sure if it is up or down right now.              He reports some  diarrhea in the past week but this is resolved at this time.  He also reports increased sputum production which is whitish.  He also reports pain in his feet which feels like his typical gout flare.  Patient denies fevers, chills, abdominal pain, nausea, vomiting, constipation.  ED Course: Vitals in ED significant for blood pressure in the 381O 175Z systolic and respiratory rate in the 20s.  Lab work-up showed BMP with creatinine of 2.7 which is near baseline of 1.9.  Calcium 8.6.  CBC showed leukocytosis of 12.9.  Troponin downtrending and chronic leg elevated here measurements were 59 and 48.  BNP elevated to 2500.  Respiratory panel for flu and Covid negative.  Patient received dose of Lasix and inhaler treatments in the ED.  Hospital Course:   [facility] hospitalists were consulted to admit this patient for further evaluation and treatment.  He was admitted to a telemetry bed, and started on diuresis. Echocardiogram was ordered. The patient is 2.6 liters negative in terms of fluid balance this morning. Even so, his BNP was elevated from 2500 on admission to over 2700.  Echocardiogram was obtained. It demonstrated a decrease in LVEF from 30-35% in 12/2019 to 20-25% on 06/17/2020. There was also wall motion

## 2020-06-19 ENCOUNTER — Other Ambulatory Visit: Payer: Self-pay | Admitting: *Deleted

## 2020-06-19 ENCOUNTER — Telehealth: Payer: Self-pay

## 2020-06-19 NOTE — Telephone Encounter (Signed)
Transition Care Management Unsuccessful Follow-up Telephone Call  Date of discharge and from where:  06/18/2020, [facility]  - left AMA  Attempts:  1st Attempt  Reason for unsuccessful TCM follow-up call:  Left voice message - call placed to # [phone], call back requested to this CM. Call also placed to # [phone] twice and both times, the phone did not even ring.  Need to discuss scheduling a follow up appointment with Dr Laural Benes

## 2020-06-19 NOTE — Patient Instructions (Signed)
Visit Information  [name]  - as a part of your Medicaid benefit, you are eligible for care management and care coordination services at no cost or copay. I was unable to reach you by phone today but would be happy to help you with your health related needs. Please feel free to call me @ [phone].   A member of the Managed Medicaid care management team will reach out to you again over the next 7-14 days.   Ladene Allocca RN, BSN   [facility] RN Care Coordinator   

## 2020-06-19 NOTE — Patient Outreach (Signed)
Care Coordination  06/19/2020  Chris [name] 08-Aug-1960 [id number]    Medicaid Managed Care   Unsuccessful Outreach Note  06/19/2020 Name: Chris [name] MRN: [id number] DOB: 07-07-1960  Referred by: [name], MD Reason for referral : High Risk Managed Medicaid (Unsuccessful follow up outreach)   A second unsuccessful telephone outreach was attempted today. The patient was referred to the case management team for assistance with care management and care coordination.   Follow Up Plan: The patient has been provided with contact information for the care management team and has been advised to call with any health related questions or concerns.  The care management team will reach out to the patient again over the next 7-14 days.   Estanislado Emms RN, BSN [place]  [facility]

## 2020-06-20 ENCOUNTER — Telehealth: Payer: Self-pay

## 2020-06-20 NOTE — Telephone Encounter (Signed)
Transition Care Management Unsuccessful Follow-up Telephone Call  Date of discharge and from where:  06/18/2020, [facility]  - left AMA  Attempts:  2nd Attempt  Reason for unsuccessful TCM follow-up call:  Left voice message Call placed to # [phone], message left with call back requested to this CM. Call placed to # [phone], the message stated that the call is not able to be completed at this time.   Letter sent to patient requesting he contact this office to schedule a follow up appointment.

## 2020-06-26 ENCOUNTER — Emergency Department (HOSPITAL_COMMUNITY)
Admission: EM | Admit: 2020-06-26 | Discharge: 2020-06-26 | Disposition: A | Discharge status: Home / Self Care | Attending: Emergency Medicine | Admitting: Emergency Medicine

## 2020-06-26 ENCOUNTER — Encounter (HOSPITAL_COMMUNITY): Payer: Self-pay | Admitting: Emergency Medicine

## 2020-06-26 ENCOUNTER — Other Ambulatory Visit (HOSPITAL_COMMUNITY): Payer: Self-pay | Admitting: Emergency Medicine

## 2020-06-26 ENCOUNTER — Emergency Department (HOSPITAL_COMMUNITY)

## 2020-06-26 DIAGNOSIS — E782 Mixed hyperlipidemia: Secondary | ICD-10-CM | POA: Diagnosis not present

## 2020-06-26 DIAGNOSIS — R0602 Shortness of breath: Secondary | ICD-10-CM | POA: Diagnosis not present

## 2020-06-26 DIAGNOSIS — Z794 Long term (current) use of insulin: Secondary | ICD-10-CM | POA: Diagnosis not present

## 2020-06-26 DIAGNOSIS — I130 Hypertensive heart and chronic kidney disease with heart failure and stage 1 through stage 4 chronic kidney disease, or unspecified chronic kidney disease: Secondary | ICD-10-CM | POA: Diagnosis not present

## 2020-06-26 DIAGNOSIS — E1122 Type 2 diabetes mellitus with diabetic chronic kidney disease: Secondary | ICD-10-CM | POA: Insufficient documentation

## 2020-06-26 DIAGNOSIS — Z79899 Other long term (current) drug therapy: Secondary | ICD-10-CM | POA: Insufficient documentation

## 2020-06-26 DIAGNOSIS — E1169 Type 2 diabetes mellitus with other specified complication: Secondary | ICD-10-CM | POA: Diagnosis not present

## 2020-06-26 DIAGNOSIS — N1831 Chronic kidney disease, stage 3a: Secondary | ICD-10-CM | POA: Insufficient documentation

## 2020-06-26 DIAGNOSIS — I2510 Atherosclerotic heart disease of native coronary artery without angina pectoris: Secondary | ICD-10-CM | POA: Diagnosis not present

## 2020-06-26 DIAGNOSIS — R0609 Other forms of dyspnea: Secondary | ICD-10-CM | POA: Diagnosis present

## 2020-06-26 DIAGNOSIS — Z7984 Long term (current) use of oral hypoglycemic drugs: Secondary | ICD-10-CM | POA: Diagnosis not present

## 2020-06-26 DIAGNOSIS — M7989 Other specified soft tissue disorders: Secondary | ICD-10-CM | POA: Diagnosis not present

## 2020-06-26 DIAGNOSIS — J441 Chronic obstructive pulmonary disease with (acute) exacerbation: Secondary | ICD-10-CM | POA: Diagnosis not present

## 2020-06-26 DIAGNOSIS — Z7982 Long term (current) use of aspirin: Secondary | ICD-10-CM | POA: Insufficient documentation

## 2020-06-26 DIAGNOSIS — F17210 Nicotine dependence, cigarettes, uncomplicated: Secondary | ICD-10-CM | POA: Diagnosis not present

## 2020-06-26 DIAGNOSIS — R600 Localized edema: Secondary | ICD-10-CM | POA: Diagnosis not present

## 2020-06-26 DIAGNOSIS — I5042 Chronic combined systolic (congestive) and diastolic (congestive) heart failure: Secondary | ICD-10-CM | POA: Insufficient documentation

## 2020-06-26 LAB — CBC
HCT: 46 % (ref 39.0–52.0)
Hemoglobin: 14.3 g/dL (ref 13.0–17.0)
MCH: 29.8 pg (ref 26.0–34.0)
MCHC: 31.1 g/dL (ref 30.0–36.0)
MCV: 95.8 fL (ref 80.0–100.0)
Platelets: 187 10*3/uL (ref 150–400)
RBC: 4.8 MIL/uL (ref 4.22–5.81)
RDW: 14.6 % (ref 11.5–15.5)
WBC: 9.9 10*3/uL (ref 4.0–10.5)
nRBC: 0 % (ref 0.0–0.2)

## 2020-06-26 LAB — BASIC METABOLIC PANEL
Anion gap: 7 (ref 5–15)
BUN: 34 mg/dL — ABNORMAL HIGH (ref 6–20)
CO2: 27 mmol/L (ref 22–32)
Calcium: 8.8 mg/dL — ABNORMAL LOW (ref 8.9–10.3)
Chloride: 105 mmol/L (ref 98–111)
Creatinine, Ser: 1.91 mg/dL — ABNORMAL HIGH (ref 0.61–1.24)
GFR, Estimated: 40 mL/min — ABNORMAL LOW (ref >60)
Glucose, Bld: 94 mg/dL (ref 70–99)
Potassium: 4.3 mmol/L (ref 3.5–5.1)
Sodium: 139 mmol/L (ref 135–145)

## 2020-06-26 MED ORDER — FUROSEMIDE 20 MG PO TABS: 60.0000 mg | ORAL_TABLET | Freq: Every day | ORAL | 0 refills | Status: DC

## 2020-06-26 MED ORDER — ALBUTEROL SULFATE HFA 108 (90 BASE) MCG/ACT IN AERS
4.0000 | INHALATION_SPRAY | Freq: Once | RESPIRATORY_TRACT | Status: AC
  Administered 2020-06-26: 4 via RESPIRATORY_TRACT
  Filled 2020-06-26: qty 6.7

## 2020-06-26 MED ORDER — FUROSEMIDE 40 MG PO TABS
40.0000 mg | ORAL_TABLET | Freq: Once | ORAL | Status: AC
  Administered 2020-06-26: 40 mg via ORAL
  Filled 2020-06-26: qty 1

## 2020-06-26 MED ORDER — PREDNISONE 20 MG PO TABS: 40.0000 mg | ORAL_TABLET | Freq: Every day | ORAL | 0 refills | Status: DC

## 2020-06-26 MED ORDER — PREDNISONE 20 MG PO TABS
60.0000 mg | ORAL_TABLET | ORAL | Status: AC
  Administered 2020-06-26: 60 mg via ORAL
  Filled 2020-06-26: qty 3

## 2020-06-26 NOTE — ED Triage Notes (Signed)
[facility], reports SOB x2 days. Hx asthma and CHF.

## 2020-06-26 NOTE — ED Notes (Signed)
[facility]-coming in for eval regarding fluid overload-increased SOB

## 2020-06-26 NOTE — ED Provider Notes (Signed)
[facility] DEPT Provider Note   CSN: [id number] Arrival date & time: 06/26/20  1351     History Chief Complaint  Patient presents with  . Shortness of Breath    Chris [name] is a 60 y.o. male.  HPI Patient with history of asthma, CHF [facility] from jail with concern for wheezing, dyspnea, lower extremity swelling. Onset seems to been yesterday.  Since that time patient has had worsening wheezing, dyspnea, possibly chest tightness, the patient is inconsistent with this.  No fever, no vomiting, no other pain.  He is unaware of recent weight gain, does note ongoing lower extremity edema. He has seemingly been taking his medication as directed, though notes that he has not had relief with albuterol recently.    Past Medical History:  Diagnosis Date  . CHF (congestive heart failure) ([place])   . Chronic kidney disease   . COPD (chronic obstructive pulmonary disease) ([place])   . Coronary artery disease   . Depression   . Diabetes mellitus without complication ([place])   . GERD (gastroesophageal reflux disease)   . Gout   . Hypertension   . Influenza A with respiratory manifestations   . Mental disorder     Patient Active Problem List   Diagnosis Date Noted  . Acute exacerbation of CHF (congestive heart failure) ([place]) 06/16/2020  . COPD with acute exacerbation ([place]) 06/16/2020  . Influenza vaccine refused 05/06/2020  . Acute on chronic combined systolic (congestive) and diastolic (congestive) heart failure ([place]) 05/05/2020  . Acute decompensated heart failure ([place]) 05/04/2020  . Illiteracy 05/04/2020  . Chronic respiratory failure with hypoxia, on home oxygen therapy ([place]) 12/28/2019  . Type 2 diabetes mellitus with stage 3 chronic kidney disease ([place]) 12/25/2019  . Acute and chronic respiratory failure (acute-on-chronic) ([place]) 12/25/2019  . Acute on chronic combined systolic and diastolic CHF (congestive heart failure) ([place]) 10/26/2019  . Elevated  troponin I level 10/26/2019  . Acute on chronic diastolic (congestive) heart failure ([place]) 10/26/2019  . History of gout 02/01/2019  . Seasonal allergic rhinitis due to pollen 02/01/2019  . Tobacco dependence 11/30/2018  . Microscopic hematuria 11/30/2018  . Depression 11/30/2018  . Difficulty controlling anger 11/30/2018  . CAP (community acquired pneumonia) 08/11/2018  . COPD (chronic obstructive pulmonary disease) ([place])   . CKD (chronic kidney disease) stage 3, GFR 30-59 ml/min ([facility]) 08/10/2018  . Recurrent epistaxis 04/21/2018  . Mixed hyperlipidemia 07/28/2017  . Essential hypertension 07/28/2017  . Chronic systolic heart failure ([place]) 10/25/2014  . Cocaine abuse ([place]) 02/20/2013  . Cannabis abuse 02/20/2013  . Back pain, chronic 02/20/2013    Past Surgical History:  Procedure Laterality Date  . ANKLE SURGERY    . CARDIAC CATHETERIZATION    . HERNIA REPAIR     x2  . SHOULDER SURGERY         Family History  Problem Relation Age of Onset  . Heart disease Father   . Diabetes Mother   . HIV Brother   . Healthy Son   . Healthy Daughter     Social History   Tobacco Use  . Smoking status: Current Every Day Smoker    Packs/day: 1.00    Years: 20.00    Pack years: 20.00    Types: Cigarettes  . Smokeless tobacco: Never Used  . Tobacco comment: less than 1 pack per day  Vaping Use  . Vaping Use: Never used  Substance Use Topics  . Alcohol use: No  . Drug use:  Yes    Frequency: 21.0 times per week    Types: Marijuana, Cocaine    Comment: no longer- Cocaine    Home Medications Prior to Admission medications   Medication Sig Start Date End Date Taking? Authorizing Provider  albuterol (PROVENTIL) (2.5 MG/3ML) 0.083% nebulizer solution Take 3 mLs by nebulization every 6 (six) hours as needed for shortness of breath. 04/17/20   [name], MD  albuterol (VENTOLIN HFA) 108 (90 Base) MCG/ACT inhaler Inhale 2 puffs into the lungs every 6 (six) hours as needed  for wheezing or shortness of breath. 04/17/20   [name], MD  allopurinol (ZYLOPRIM) 100 MG tablet Take 2 tablets (200 mg total) by mouth daily. 04/17/20   [name], MD  aspirin 81 MG EC tablet Take 1 tablet (81 mg total) by mouth daily. 03/06/20   [name], MD  atorvastatin (LIPITOR) 40 MG tablet Take 1 tablet (40 mg total) by mouth daily at 6 PM. 04/17/20   [name], MD  blood glucose meter kit and supplies Dispense based on patient and insurance preference. Use up to four times daily as directed. (FOR ICD-10 E10.9, E11.9). 04/17/20   [name], MD  Blood Glucose Monitoring Suppl (ACCU-CHEK GUIDE) w/Device KIT USE AS DIRECTED 04/17/20   [name], MD  Blood Glucose Monitoring Suppl (TRUE METRIX METER) w/Device KIT Use as directed 04/17/20   [name], MD  budesonide-formoterol [facility]) 160-4.5 MCG/ACT inhaler Inhale 2 puffs into the lungs 2 (two) times daily. 04/17/20   [name], MD  carvedilol (COREG) 25 MG tablet Take 1 tablet (25 mg total) by mouth 2 (two) times daily with a meal. 04/17/20   [name], MD  diclofenac Sodium (VOLTAREN) 1 % GEL Apply 2 g topically 4 (four) times daily. Use on elbow pain 05/05/20   [name], MD  fluticasone [facility]) 50 MCG/ACT nasal spray Place 1 spray into both nostrils daily as needed for allergies or rhinitis. 05/06/20   Swords, Darrick Penna, MD  furosemide (LASIX) 40 MG tablet Take 1.5 tablets (60 mg total) by mouth daily. 05/05/20   [name], MD  glipiZIDE (GLUCOTROL) 5 MG tablet Take 0.5 tablets (2.5 mg total) by mouth 2 (two) times daily before a meal. 04/17/20   [name], MD  glucose blood (ACCU-CHEK GUIDE) test strip Use as instructed. Check blood glucose by fingerstick once per day. 03/06/20   [name], MD  glucose blood (TRUE METRIX BLOOD GLUCOSE TEST) test strip Use as instructed 12/28/19   [name], MD  isosorbide-hydrALAZINE (BIDIL) 20-37.5  MG tablet Take 1 tablet by mouth 3 (three) times daily. 04/17/20   [name], MD  Lancet Devices (LANCING DEVICE) MISC 1 Device by Does not apply route daily. 12/27/19   Regalado, Belkys A, MD  montelukast (SINGULAIR) 10 MG tablet Take 1 tablet (10 mg total) by mouth at bedtime. 04/17/20 07/16/20  [name], MD  sacubitril-valsartan (ENTRESTO) 24-26 MG Take 1 tablet by mouth 2 (two) times daily. 04/17/20   [name], MD  TRUEplus Lancets 28G MISC Use as directed 12/28/19   [name], MD    Allergies    Patient has no known allergies.  Review of Systems   Review of Systems  Constitutional:       Per HPI, otherwise negative  HENT:       Per HPI, otherwise negative  Respiratory:  Per HPI, otherwise negative  Cardiovascular:       Per HPI, otherwise negative  Gastrointestinal: Negative for vomiting.  Endocrine:       Negative aside from HPI  Genitourinary:       Neg aside from HPI   Musculoskeletal:       Per HPI, otherwise negative  Skin: Negative.   Neurological: Negative for syncope.    Physical Exam Updated Vital Signs BP (!) 163/120 (BP Location: Right Arm)   Pulse 84   Temp 98 F (36.7 C) (Oral)   Resp 20   SpO2 97%   Physical Exam Vitals and nursing note reviewed.  Constitutional:      General: He is not in acute distress.    Appearance: He is well-developed.  HENT:     Head: Normocephalic and atraumatic.  Eyes:     Extraocular Movements: EOM normal.     Conjunctiva/sclera: Conjunctivae normal.  Cardiovascular:     Rate and Rhythm: Normal rate and regular rhythm.  Pulmonary:     Effort: Pulmonary effort is normal. Tachypnea present.     Breath sounds: Decreased breath sounds and wheezing present.  Abdominal:     General: There is no distension.  Musculoskeletal:     Right lower leg: Edema present.     Left lower leg: Edema present.  Skin:    General: Skin is warm and dry.  Neurological:     Mental Status: He is alert  and oriented to person, place, and time.  Psychiatric:        Mood and Affect: Mood and affect normal.     ED Results / Procedures / Treatments   Labs (all labs ordered are listed, but only abnormal results are displayed) Labs Reviewed  BASIC METABOLIC PANEL - Abnormal; Notable for the following components:      Result Value   BUN 34 (*)    Creatinine, Ser 1.91 (*)    Calcium 8.8 (*)    GFR, Estimated 40 (*)    All other components within normal limits  CBC    EKG None  Radiology DG Chest 2 View  Result Date: 06/26/2020 CLINICAL DATA:  Shortness of breath. EXAM: CHEST - 2 VIEW COMPARISON:  06/16/2020 FINDINGS: The cardio pericardial silhouette is enlarged. There is pulmonary vascular congestion without overt pulmonary edema. Diffuse bilateral interstitial opacity suggest pulmonary edema. No substantial pleural effusion. The visualized bony structures of the thorax show no acute abnormality. IMPRESSION: Cardiomegaly with vascular congestion and probable interstitial edema. Electronically Signed   By: Kennith Center M.D.   On: 06/26/2020 15:44    Procedures Procedures (including critical care time)  Medications Ordered in ED Medications  furosemide (LASIX) tablet 40 mg (has no administration in time range)  albuterol (VENTOLIN HFA) 108 (90 Base) MCG/ACT inhaler 4 puff (4 puffs Inhalation Given 06/26/20 1609)    ED Course  I have reviewed the triage vital signs and the nursing notes.  Pertinent labs & imaging results that were available during my care of the patient were reviewed by me and considered in my medical decision making (see chart for details).  5:29 PM  On repeat exam the patient is awake, alert, sitting on the edge of his bed, speaking clearly, no increased work of breathing, no oxygen requirement We discussed labs, notable for slight improvement in creatinine, x-ray without pneumonia, but consistent with concern for CHF worsening. With no distress, patient is  appropriate for increased dose of his Lasix, continued albuterol which  was provided here, and short course of steroids given concern for multifactorial etiology for his dyspnea given his history of COPD. With no evidence for pneumonia, or other hemodynamic instability, the patient is appropriate for discharge in stable condition.  We discussed his ongoing Lasix therapy, which will be increased for the next 5 days.  Final Clinical Impression(s) / ED Diagnoses Final diagnoses:  SOB (shortness of breath)    Rx / DC Orders ED Discharge Orders         Ordered    predniSONE (DELTASONE) 20 MG tablet  Daily with breakfast        06/26/20 1733    furosemide (LASIX) 20 MG tablet  Daily        06/26/20 1733           Carmin Muskrat, MD 06/26/20 1734

## 2020-06-26 NOTE — Discharge Instructions (Addendum)
You have been diagnosed with shortness of breath. This is likely due to combination of your COPD and CHF.  Please take your increased dose of Lasix, 60 mg, each day for the next 5.  In addition, please complete a course of steroids, over the next 4 days.  Please use the provided albuterol inhaler for additional relief.  Return here for concerning changes in your condition.

## 2020-06-27 ENCOUNTER — Other Ambulatory Visit: Payer: Self-pay | Admitting: *Deleted

## 2020-06-27 MED FILL — predniSONE 20 MG TABS: 20 | 4 days supply | Qty: 8 | Fill #0

## 2020-06-27 MED FILL — FUROSEMIDE 20 MG TABS: 20 | 5 days supply | Qty: 15 | Fill #0

## 2020-06-27 NOTE — Patient Instructions (Signed)
Visit Information  [name]  - as a part of your Medicaid benefit, you are eligible for care management and care coordination services at no cost or copay. I was unable to reach you by phone today but would be happy to help you with your health related needs. Please feel free to call me @ [phone].   A member of the Managed Medicaid care management team will reach out to you again over the next 7-14 days.   Melanie Robb RN, BSN [place]  [facility] RN Care Coordinator   

## 2020-06-27 NOTE — Patient Outreach (Signed)
Care Coordination  06/27/2020  HURSHELL DINO 1960/09/19 [id number]    Medicaid Managed Care   Unsuccessful Outreach Note  06/27/2020 Name: LEWI DROST MRN: [id number] DOB: 03/31/1961  Referred by: [name], MD Reason for referral : No chief complaint on file.   An unsuccessful telephone outreach was attempted today. The patient was referred to the case management team for assistance with care management and care coordination.   Follow Up Plan: The care management team will reach out to the patient again over the next 7-14 days.   [name] RN, BSN [place]  [employer] manager

## 2020-07-01 ENCOUNTER — Other Ambulatory Visit: Payer: Self-pay | Admitting: Internal Medicine

## 2020-07-01 ENCOUNTER — Ambulatory Visit: Payer: Self-pay | Admitting: *Deleted

## 2020-07-01 ENCOUNTER — Telehealth: Payer: Self-pay

## 2020-07-01 DIAGNOSIS — J439 Emphysema, unspecified: Secondary | ICD-10-CM

## 2020-07-01 DIAGNOSIS — J301 Allergic rhinitis due to pollen: Secondary | ICD-10-CM

## 2020-07-01 DIAGNOSIS — E669 Obesity, unspecified: Secondary | ICD-10-CM

## 2020-07-01 DIAGNOSIS — E785 Hyperlipidemia, unspecified: Secondary | ICD-10-CM

## 2020-07-01 DIAGNOSIS — I5042 Chronic combined systolic (congestive) and diastolic (congestive) heart failure: Secondary | ICD-10-CM

## 2020-07-01 DIAGNOSIS — I10 Essential (primary) hypertension: Secondary | ICD-10-CM

## 2020-07-01 DIAGNOSIS — E1169 Type 2 diabetes mellitus with other specified complication: Secondary | ICD-10-CM

## 2020-07-01 MED ORDER — GLIPIZIDE 5 MG PO TABS: 2.5000 mg | ORAL_TABLET | Freq: Two times a day (BID) | ORAL | 4 refills | Status: DC

## 2020-07-01 MED ORDER — PREDNISONE 20 MG PO TABS: ORAL_TABLET | ORAL | 0 refills | Status: DC

## 2020-07-01 MED ORDER — ISOSORB DINITRATE-HYDRALAZINE 20-37.5 MG PO TABS: 1.0000 | ORAL_TABLET | Freq: Three times a day (TID) | ORAL | 4 refills | Status: DC

## 2020-07-01 MED ORDER — MONTELUKAST SODIUM 10 MG PO TABS: 10.0000 mg | ORAL_TABLET | Freq: Every day | ORAL | 2 refills | Status: DC

## 2020-07-01 MED ORDER — FUROSEMIDE 40 MG PO TABS: 60.0000 mg | ORAL_TABLET | Freq: Every day | ORAL | 2 refills | Status: DC

## 2020-07-01 MED ORDER — SACUBITRIL-VALSARTAN 24-26 MG PO TABS: 1.0000 | ORAL_TABLET | Freq: Two times a day (BID) | ORAL | 4 refills | Status: DC

## 2020-07-01 MED ORDER — ATORVASTATIN CALCIUM 40 MG PO TABS: 40.0000 mg | ORAL_TABLET | Freq: Every day | ORAL | 2 refills | Status: DC

## 2020-07-01 MED ORDER — ASPIRIN 81 MG PO TBEC: 81.0000 mg | DELAYED_RELEASE_TABLET | Freq: Every day | ORAL | 2 refills | Status: DC

## 2020-07-01 MED ORDER — CARVEDILOL 25 MG PO TABS: 25.0000 mg | ORAL_TABLET | Freq: Two times a day (BID) | ORAL | 4 refills | Status: DC

## 2020-07-01 MED ORDER — ALLOPURINOL 100 MG PO TABS
200.0000 mg | ORAL_TABLET | Freq: Every day | ORAL | 4 refills | Status: DC
Start: 2020-07-01 — End: 2020-07-01

## 2020-07-01 MED FILL — ALLOPURINOL 100 MG TABLET: 100 | 30 days supply | Qty: 60 | Fill #0

## 2020-07-01 MED FILL — CARVEDILOL 25 MG TABLET: 25 | 30 days supply | Qty: 60 | Fill #0

## 2020-07-01 MED FILL — ENTRESTO 24 MG-26 MG TABLET: 24-26 | 30 days supply | Qty: 60 | Fill #0

## 2020-07-01 MED FILL — ATORVASTATIN CALCIUM 40 MG: 40 | 30 days supply | Qty: 30 | Fill #0

## 2020-07-01 MED FILL — BIDIL 20-37.5 MG TABS: 20-37.5 | 30 days supply | Qty: 90 | Fill #0

## 2020-07-01 MED FILL — MONTELUKAST SOD 10 MG TAB: 10 | 30 days supply | Qty: 30 | Fill #0

## 2020-07-01 MED FILL — FUROSEMIDE 40 MG TAB: 40 | 30 days supply | Qty: 45 | Fill #0

## 2020-07-01 MED FILL — predniSONE 20 MG TABS: 20 | 8 days supply | Qty: 10 | Fill #0

## 2020-07-01 MED FILL — glipiZIDE 5 MG TABS: 5 | 30 days supply | Qty: 30 | Fill #0

## 2020-07-01 NOTE — Telephone Encounter (Signed)
C/o severe pain in right great toe and right foot x 4 days. Right great toe red and hot to touch. C/o unable to allow covers to touch toe or foot. numbness reported in right foot. Patient reports he is out of medications and wants all meds refilled. Earliest appt 07/03/20 and patient reports he needs help today. Reports he is unable to walk. Instructed patient to go to ED. Will submit request for medications to be refilled. Denies fever, swelling or ankle pain. Please advise is appt can be scheduled for today. Care advise given. Patient verbalized understanding of care advise and to call back or go to ED is pain continues to be severe and he can not walk.   Reason for Disposition . [1] SEVERE pain (e.g., excruciating, unable to do any normal activities) AND [2] not improved after 2 hours of pain medicine  Answer Assessment - Initial Assessment Questions 1. ONSET: "When did the pain start?"      4 days ago  2. LOCATION: "Where is the pain located?"      Right great toe and right foot 3. PAIN: "How bad is the pain?"    (Scale 1-10; or mild, moderate, severe)  - MILD (1-3): doesn't interfere with normal activities.   - MODERATE (4-7): interferes with normal activities (e.g., work or school) or awakens from sleep, limping.   - SEVERE (8-10): excruciating pain, unable to do any normal activities, unable to walk.      Severe. Can not allow sheet to touch toe 4. WORK OR EXERCISE: "Has there been any recent work or exercise that involved this part of the body?"      No  5. CAUSE: "What do you think is causing the foot pain?"     Gout  6. OTHER SYMPTOMS: "Do you have any other symptoms?" (e.g., leg pain, rash, fever, numbness)     Numbness in foot  7. PREGNANCY: "Is there any chance you are pregnant?" "When was your last menstrual period?"     na  Protocols used: FOOT PAIN-A-AH

## 2020-07-01 NOTE — Telephone Encounter (Signed)
Returned pt call and made aware that rxs has beem to pharmacy. Pt states he has been put of his house and doesn't have anywhere to go. Pt states he doesn't have anywhere to go to plug in breathing machine or oxygen machine. Pt states he is out in the cold.

## 2020-07-01 NOTE — Telephone Encounter (Signed)
FYI

## 2020-07-01 NOTE — Telephone Encounter (Signed)
Met with the patient when he came to pick up his medications at [facility].  He explained that he had been living with friends and paying them rent; however they were evicted today and he has no where to go.  He said he has contacted the [facility] and there are not any beds available and he has no one- family/friends to stay with.  His belongings are in his sister's car; but he is not able to stay with her.  He was in agreement to having this CM call Debbie Bailey/[facility].   Call placed to [facility] and explained above.   She said that she would contact the patient. At this time, the only options is the [facility] - they have a white flag out tonight due to the temperatures.  There are no beds at local shelters. He has an income and she said that they may be able to assist him  get into an apartment if he is able to find one.   He was speaking with Epimenio Sarin when he left the clinic. He was provided with some food as well as the phone number for [facility]

## 2020-07-02 ENCOUNTER — Other Ambulatory Visit: Payer: Self-pay | Admitting: *Deleted

## 2020-07-02 ENCOUNTER — Other Ambulatory Visit: Payer: Self-pay
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
## 2020-07-02 NOTE — Telephone Encounter (Signed)
Call placed to [facility] regarding status of housing for patient.  She said she told him about the white flag at [facility] yesterday.  They also discussed more permanent housing. He does not have much to spend and she told him that he would only be able to afford a boarding house.  He said he was okay with that and asked that she call his parole officer which she did. Jackelyn Poling stated that the boarding house that she contacted will have 2 rooms available at the end of this month.  She now needs to follow up with the boarding house regarding next steps for the patient as far as submitting an application.  She also noted that the patient has called her multiple times today.

## 2020-07-03 ENCOUNTER — Inpatient Hospital Stay
Admission: EM | Admit: 2020-07-03 | Discharge: 2020-07-09 | DRG: 291 | Disposition: A | Discharge status: Home / Self Care | Payer: Medicaid Other | Attending: Internal Medicine | Admitting: Internal Medicine

## 2020-07-03 ENCOUNTER — Other Ambulatory Visit: Payer: Self-pay

## 2020-07-03 ENCOUNTER — Emergency Department: Payer: Medicaid Other

## 2020-07-03 ENCOUNTER — Encounter: Payer: Self-pay | Admitting: Emergency Medicine

## 2020-07-03 DIAGNOSIS — I5042 Chronic combined systolic (congestive) and diastolic (congestive) heart failure: Secondary | ICD-10-CM

## 2020-07-03 DIAGNOSIS — Z8739 Personal history of other diseases of the musculoskeletal system and connective tissue: Secondary | ICD-10-CM

## 2020-07-03 DIAGNOSIS — Z7982 Long term (current) use of aspirin: Secondary | ICD-10-CM

## 2020-07-03 DIAGNOSIS — J80 Acute respiratory distress syndrome: Secondary | ICD-10-CM | POA: Diagnosis not present

## 2020-07-03 DIAGNOSIS — Z9981 Dependence on supplemental oxygen: Secondary | ICD-10-CM

## 2020-07-03 DIAGNOSIS — J9621 Acute and chronic respiratory failure with hypoxia: Secondary | ICD-10-CM | POA: Diagnosis present

## 2020-07-03 DIAGNOSIS — I483 Typical atrial flutter: Secondary | ICD-10-CM | POA: Diagnosis not present

## 2020-07-03 DIAGNOSIS — J9811 Atelectasis: Secondary | ICD-10-CM | POA: Diagnosis not present

## 2020-07-03 DIAGNOSIS — M109 Gout, unspecified: Secondary | ICD-10-CM | POA: Diagnosis present

## 2020-07-03 DIAGNOSIS — R069 Unspecified abnormalities of breathing: Secondary | ICD-10-CM | POA: Diagnosis not present

## 2020-07-03 DIAGNOSIS — I5043 Acute on chronic combined systolic (congestive) and diastolic (congestive) heart failure: Secondary | ICD-10-CM | POA: Diagnosis present

## 2020-07-03 DIAGNOSIS — E872 Acidosis: Secondary | ICD-10-CM | POA: Diagnosis present

## 2020-07-03 DIAGNOSIS — I10 Essential (primary) hypertension: Secondary | ICD-10-CM | POA: Diagnosis not present

## 2020-07-03 DIAGNOSIS — E782 Mixed hyperlipidemia: Secondary | ICD-10-CM | POA: Diagnosis present

## 2020-07-03 DIAGNOSIS — K219 Gastro-esophageal reflux disease without esophagitis: Secondary | ICD-10-CM | POA: Diagnosis present

## 2020-07-03 DIAGNOSIS — Z7984 Long term (current) use of oral hypoglycemic drugs: Secondary | ICD-10-CM

## 2020-07-03 DIAGNOSIS — N183 Chronic kidney disease, stage 3 unspecified: Secondary | ICD-10-CM | POA: Diagnosis present

## 2020-07-03 DIAGNOSIS — R0602 Shortness of breath: Secondary | ICD-10-CM | POA: Diagnosis not present

## 2020-07-03 DIAGNOSIS — Z8249 Family history of ischemic heart disease and other diseases of the circulatory system: Secondary | ICD-10-CM

## 2020-07-03 DIAGNOSIS — Z79899 Other long term (current) drug therapy: Secondary | ICD-10-CM

## 2020-07-03 DIAGNOSIS — F1490 Cocaine use, unspecified, uncomplicated: Secondary | ICD-10-CM | POA: Diagnosis present

## 2020-07-03 DIAGNOSIS — N1832 Chronic kidney disease, stage 3b: Secondary | ICD-10-CM | POA: Diagnosis present

## 2020-07-03 DIAGNOSIS — G4733 Obstructive sleep apnea (adult) (pediatric): Secondary | ICD-10-CM | POA: Diagnosis present

## 2020-07-03 DIAGNOSIS — F32A Depression, unspecified: Secondary | ICD-10-CM | POA: Diagnosis present

## 2020-07-03 DIAGNOSIS — E1122 Type 2 diabetes mellitus with diabetic chronic kidney disease: Secondary | ICD-10-CM | POA: Diagnosis present

## 2020-07-03 DIAGNOSIS — I5022 Chronic systolic (congestive) heart failure: Secondary | ICD-10-CM | POA: Diagnosis present

## 2020-07-03 DIAGNOSIS — F17200 Nicotine dependence, unspecified, uncomplicated: Secondary | ICD-10-CM | POA: Diagnosis present

## 2020-07-03 DIAGNOSIS — I130 Hypertensive heart and chronic kidney disease with heart failure and stage 1 through stage 4 chronic kidney disease, or unspecified chronic kidney disease: Principal | ICD-10-CM | POA: Diagnosis present

## 2020-07-03 DIAGNOSIS — Z833 Family history of diabetes mellitus: Secondary | ICD-10-CM

## 2020-07-03 DIAGNOSIS — Z20822 Contact with and (suspected) exposure to covid-19: Secondary | ICD-10-CM | POA: Diagnosis present

## 2020-07-03 DIAGNOSIS — R0902 Hypoxemia: Secondary | ICD-10-CM | POA: Diagnosis not present

## 2020-07-03 DIAGNOSIS — Z7951 Long term (current) use of inhaled steroids: Secondary | ICD-10-CM

## 2020-07-03 DIAGNOSIS — J441 Chronic obstructive pulmonary disease with (acute) exacerbation: Secondary | ICD-10-CM | POA: Diagnosis present

## 2020-07-03 DIAGNOSIS — J9601 Acute respiratory failure with hypoxia: Secondary | ICD-10-CM | POA: Diagnosis present

## 2020-07-03 DIAGNOSIS — K5900 Constipation, unspecified: Secondary | ICD-10-CM | POA: Diagnosis not present

## 2020-07-03 DIAGNOSIS — J9620 Acute and chronic respiratory failure, unspecified whether with hypoxia or hypercapnia: Secondary | ICD-10-CM | POA: Diagnosis present

## 2020-07-03 DIAGNOSIS — I2510 Atherosclerotic heart disease of native coronary artery without angina pectoris: Secondary | ICD-10-CM | POA: Diagnosis present

## 2020-07-03 DIAGNOSIS — I420 Dilated cardiomyopathy: Secondary | ICD-10-CM | POA: Diagnosis present

## 2020-07-03 DIAGNOSIS — E875 Hyperkalemia: Secondary | ICD-10-CM | POA: Diagnosis not present

## 2020-07-03 LAB — COMPREHENSIVE METABOLIC PANEL
ALT: 20 U/L (ref 0–44)
AST: 15 U/L (ref 15–41)
Albumin: 3.4 g/dL — ABNORMAL LOW (ref 3.5–5.0)
Alkaline Phosphatase: 77 U/L (ref 38–126)
Anion gap: 11 (ref 5–15)
BUN: 25 mg/dL — ABNORMAL HIGH (ref 6–20)
CO2: 28 mmol/L (ref 22–32)
Calcium: 8.8 mg/dL — ABNORMAL LOW (ref 8.9–10.3)
Chloride: 101 mmol/L (ref 98–111)
Creatinine, Ser: 2.16 mg/dL — ABNORMAL HIGH (ref 0.61–1.24)
GFR, Estimated: 35 mL/min — ABNORMAL LOW (ref >60)
Glucose, Bld: 42 mg/dL — CL (ref 70–99)
Potassium: 4.5 mmol/L (ref 3.5–5.1)
Sodium: 140 mmol/L (ref 135–145)
Total Bilirubin: 0.7 mg/dL (ref 0.3–1.2)
Total Protein: 7.1 g/dL (ref 6.5–8.1)

## 2020-07-03 LAB — URINALYSIS, COMPLETE (UACMP) WITH MICROSCOPIC
Bacteria, UA: NONE SEEN
Bilirubin Urine: NEGATIVE
Glucose, UA: NEGATIVE mg/dL
Ketones, ur: NEGATIVE mg/dL
Leukocytes,Ua: NEGATIVE
Nitrite: NEGATIVE
Protein, ur: 100 mg/dL — AB
Specific Gravity, Urine: 1.011 (ref 1.005–1.030)
pH: 6 (ref 5.0–8.0)

## 2020-07-03 LAB — CBC WITH DIFFERENTIAL/PLATELET
Abs Immature Granulocytes: 0.04 10*3/uL (ref 0.00–0.07)
Basophils Absolute: 0.1 10*3/uL (ref 0.0–0.1)
Basophils Relative: 0 %
Eosinophils Absolute: 0.2 10*3/uL (ref 0.0–0.5)
Eosinophils Relative: 1 %
HCT: 49.8 % (ref 39.0–52.0)
Hemoglobin: 15.9 g/dL (ref 13.0–17.0)
Immature Granulocytes: 0 %
Lymphocytes Relative: 16 %
Lymphs Abs: 2.4 10*3/uL (ref 0.7–4.0)
MCH: 29.6 pg (ref 26.0–34.0)
MCHC: 31.9 g/dL (ref 30.0–36.0)
MCV: 92.7 fL (ref 80.0–100.0)
Monocytes Absolute: 1.3 10*3/uL — ABNORMAL HIGH (ref 0.1–1.0)
Monocytes Relative: 9 %
Neutro Abs: 11.4 10*3/uL — ABNORMAL HIGH (ref 1.7–7.7)
Neutrophils Relative %: 74 %
Platelets: 234 10*3/uL (ref 150–400)
RBC: 5.37 MIL/uL (ref 4.22–5.81)
RDW: 14.6 % (ref 11.5–15.5)
WBC: 15.3 10*3/uL — ABNORMAL HIGH (ref 4.0–10.5)
nRBC: 0 % (ref 0.0–0.2)

## 2020-07-03 LAB — MAGNESIUM: Magnesium: 2 mg/dL (ref 1.7–2.4)

## 2020-07-03 LAB — BRAIN NATRIURETIC PEPTIDE: B Natriuretic Peptide: 1617.4 pg/mL — ABNORMAL HIGH (ref 0.0–100.0)

## 2020-07-03 LAB — CBG MONITORING, ED
Glucose-Capillary: 109 mg/dL — ABNORMAL HIGH (ref 70–99)
Glucose-Capillary: 136 mg/dL — ABNORMAL HIGH (ref 70–99)

## 2020-07-03 LAB — SARS CORONAVIRUS 2 BY RT PCR (HOSPITAL ORDER, PERFORMED IN ~~LOC~~ HOSPITAL LAB): SARS Coronavirus 2: NEGATIVE

## 2020-07-03 LAB — TROPONIN I (HIGH SENSITIVITY)
Troponin I (High Sensitivity): 31 ng/L — ABNORMAL HIGH (ref <18)
Troponin I (High Sensitivity): 31 ng/L — ABNORMAL HIGH (ref <18)

## 2020-07-03 LAB — LACTIC ACID, PLASMA: Lactic Acid, Venous: 2 mmol/L (ref 0.5–1.9)

## 2020-07-03 LAB — LIPASE, BLOOD: Lipase: 19 U/L (ref 11–51)

## 2020-07-03 LAB — PROCALCITONIN: Procalcitonin: 0.15 ng/mL

## 2020-07-03 MED ORDER — SODIUM CHLORIDE 0.9 % IV SOLN: 250.0000 mL | INTRAVENOUS | Status: DC | PRN

## 2020-07-03 MED ORDER — ALBUTEROL SULFATE (2.5 MG/3ML) 0.083% IN NEBU
2.5000 mg | INHALATION_SOLUTION | Freq: Once | RESPIRATORY_TRACT | Status: AC
  Administered 2020-07-03: 2.5 mg via RESPIRATORY_TRACT
  Filled 2020-07-03: qty 3

## 2020-07-03 MED ORDER — SODIUM CHLORIDE 0.9% FLUSH: 3.0000 mL | INTRAVENOUS | Status: DC | PRN

## 2020-07-03 MED ORDER — FUROSEMIDE 10 MG/ML IJ SOLN
40.0000 mg | Freq: Once | INTRAMUSCULAR | Status: AC
  Administered 2020-07-03: 40 mg via INTRAVENOUS
  Filled 2020-07-03: qty 4

## 2020-07-03 MED ORDER — SODIUM CHLORIDE 0.9 % IV SOLN
500.0000 mg | Freq: Once | INTRAVENOUS | Status: AC
  Administered 2020-07-03: 500 mg via INTRAVENOUS
  Filled 2020-07-03: qty 500

## 2020-07-03 MED ORDER — IPRATROPIUM-ALBUTEROL 0.5-2.5 (3) MG/3ML IN SOLN: 9.0000 mL | Freq: Once | RESPIRATORY_TRACT | Status: AC

## 2020-07-03 MED ORDER — IPRATROPIUM-ALBUTEROL 0.5-2.5 (3) MG/3ML IN SOLN
RESPIRATORY_TRACT | Status: AC
  Administered 2020-07-03: 9 mL via RESPIRATORY_TRACT
  Filled 2020-07-03: qty 9

## 2020-07-03 MED ORDER — SODIUM CHLORIDE 0.9 % IV SOLN
1.0000 g | Freq: Once | INTRAVENOUS | Status: AC
  Administered 2020-07-03: 1 g via INTRAVENOUS
  Filled 2020-07-03: qty 10

## 2020-07-03 MED ORDER — METHYLPREDNISOLONE SODIUM SUCC 125 MG IJ SOLR
125.0000 mg | Freq: Once | INTRAMUSCULAR | Status: AC
  Administered 2020-07-03: 125 mg via INTRAVENOUS
  Filled 2020-07-03: qty 2

## 2020-07-03 MED ORDER — SODIUM CHLORIDE 0.9% FLUSH
3.0000 mL | Freq: Two times a day (BID) | INTRAVENOUS | Status: DC
  Administered 2020-07-03 – 2020-07-05 (×3): 3 mL via INTRAVENOUS

## 2020-07-03 NOTE — ED Notes (Signed)
Pt cbg is now 109.

## 2020-07-03 NOTE — ED Provider Notes (Signed)
10:50 PM  Patient admitted for CHF and COPD exacerbation.  Initially on bipap.  Patient now transitioned to 4L [place] and doing well.  Will continue to monitor.   Adalis Gatti, Delice Bison, DO 07/03/20 2252

## 2020-07-03 NOTE — ED Provider Notes (Signed)
[facility] Emergency Department Provider Note   ____________________________________________   Event Date/Time   First MD Initiated Contact with Patient 07/03/20 2027     (approximate)  I have reviewed the triage vital signs and the nursing notes.   HISTORY  Chief Complaint Shortness of Breath    HPI Chris [name] is a 60 y.o. male with past medical history of hypertension, hyperlipidemia, diabetes, COPD on 2 L, CHF, and CKD who presents to the ED for shortness of breath.  Patient reports acute worsening difficulty breathing around 12 hours prior to arrival.  This is associated with tightness in his chest.  He denies any recent fevers or cough.  Per EMS, patient noted to have room air sats in the 70s and he was placed on CPAP.  He was slightly somnolent on EMS arrival, but mental status has improved following application of CPAP.  Patient reports he feels slightly better but continues to feel short of breath with chest tightness.  Patient markedly hypertensive with EMS and Nitropaste was placed on his chest.        Past Medical History:  Diagnosis Date  . CHF (congestive heart failure) ([place])   . Chronic kidney disease   . COPD (chronic obstructive pulmonary disease) ([place])   . Coronary artery disease   . Depression   . Diabetes mellitus without complication ([place])   . GERD (gastroesophageal reflux disease)   . Gout   . Hypertension   . Influenza A with respiratory manifestations   . Mental disorder     Patient Active Problem List   Diagnosis Date Noted  . Acute exacerbation of CHF (congestive heart failure) ([place]) 06/16/2020  . COPD with acute exacerbation ([place]) 06/16/2020  . Influenza vaccine refused 05/06/2020  . Acute on chronic combined systolic (congestive) and diastolic (congestive) heart failure ([place]) 05/05/2020  . Acute decompensated heart failure ([place]) 05/04/2020  . Illiteracy 05/04/2020  . Chronic respiratory failure with hypoxia, on  home oxygen therapy ([place]) 12/28/2019  . Type 2 diabetes mellitus with stage 3 chronic kidney disease ([place]) 12/25/2019  . Acute and chronic respiratory failure (acute-on-chronic) ([place]) 12/25/2019  . Acute on chronic combined systolic and diastolic CHF (congestive heart failure) ([place]) 10/26/2019  . Elevated troponin I level 10/26/2019  . Acute on chronic diastolic (congestive) heart failure ([place]) 10/26/2019  . History of gout 02/01/2019  . Seasonal allergic rhinitis due to pollen 02/01/2019  . Tobacco dependence 11/30/2018  . Microscopic hematuria 11/30/2018  . Depression 11/30/2018  . Difficulty controlling anger 11/30/2018  . CAP (community acquired pneumonia) 08/11/2018  . COPD (chronic obstructive pulmonary disease) ([place])   . CKD (chronic kidney disease) stage 3, GFR 30-59 ml/min (H[facility]) 08/10/2018  . Recurrent epistaxis 04/21/2018  . Mixed hyperlipidemia 07/28/2017  . Essential hypertension 07/28/2017  . Chronic systolic heart failure ([place]) 10/25/2014  . Cocaine abuse ([place]) 02/20/2013  . Cannabis abuse 02/20/2013  . Back pain, chronic 02/20/2013    Past Surgical History:  Procedure Laterality Date  . ANKLE SURGERY    . CARDIAC CATHETERIZATION    . HERNIA REPAIR     x2  . SHOULDER SURGERY      Prior to Admission medications   Medication Sig Start Date End Date Taking? Authorizing Provider  albuterol (PROVENTIL) (2.5 MG/3ML) 0.083% nebulizer solution Take 3 mLs by nebulization every 6 (six) hours as needed for shortness of breath. 04/17/20   [name], MD  albuterol (VENTOLIN HFA) 108 (90 Base) MCG/ACT inhaler Inhale  2 puffs into the lungs every 6 (six) hours as needed for wheezing or shortness of breath. 04/17/20   [name], MD  allopurinol (ZYLOPRIM) 100 MG tablet Take 2 tablets (200 mg total) by mouth daily. 07/01/20   [name], MD  aspirin 81 MG EC tablet Take 1 tablet (81 mg total) by mouth daily. 07/01/20   [name], MD  atorvastatin  (LIPITOR) 40 MG tablet Take 1 tablet (40 mg total) by mouth daily at 6 PM. 07/01/20   [name], MD  blood glucose meter kit and supplies Dispense based on patient and insurance preference. Use up to four times daily as directed. (FOR ICD-10 E10.9, E11.9). 04/17/20   [name], MD  Blood Glucose Monitoring Suppl (ACCU-CHEK GUIDE) w/Device KIT USE AS DIRECTED 04/17/20   [name], MD  Blood Glucose Monitoring Suppl (TRUE METRIX METER) w/Device KIT Use as directed 04/17/20   [name], MD  budesonide-formoterol [facility]) 160-4.5 MCG/ACT inhaler Inhale 2 puffs into the lungs 2 (two) times daily. 04/17/20   [name], MD  carvedilol (COREG) 25 MG tablet Take 1 tablet (25 mg total) by mouth 2 (two) times daily with a meal. 07/01/20   [name], MD  diclofenac Sodium (VOLTAREN) 1 % GEL Apply 2 g topically 4 (four) times daily. Use on elbow pain 05/05/20   [name], MD  fluticasone [facility]) 50 MCG/ACT nasal spray Place 1 spray into both nostrils daily as needed for allergies or rhinitis. 05/06/20   Swords, Darrick Penna, MD  furosemide (LASIX) 40 MG tablet Take 1.5 tablets (60 mg total) by mouth daily. 07/01/20   [name], MD  glipiZIDE (GLUCOTROL) 5 MG tablet Take 0.5 tablets (2.5 mg total) by mouth 2 (two) times daily before a meal. 07/01/20   [name], MD  glucose blood (ACCU-CHEK GUIDE) test strip Use as instructed. Check blood glucose by fingerstick once per day. 03/06/20   [name], MD  glucose blood (TRUE METRIX BLOOD GLUCOSE TEST) test strip Use as instructed 12/28/19   [name], MD  isosorbide-hydrALAZINE (BIDIL) 20-37.5 MG tablet Take 1 tablet by mouth 3 (three) times daily. 07/01/20   [name], MD  Lancet Devices (LANCING DEVICE) MISC 1 Device by Does not apply route daily. 12/27/19   Regalado, Belkys A, MD  montelukast (SINGULAIR) 10 MG tablet Take 1 tablet (10 mg total) by mouth at bedtime. 07/01/20  09/29/20  [name], MD  predniSONE (DELTASONE) 20 MG tablet Take 2 tabs PO daily x 2 days, then 1.5 tab daily x 2 days then 1 tab x2 days then 1/2 tab x 2 days 07/01/20   [name], MD  sacubitril-valsartan (ENTRESTO) 24-26 MG Take 1 tablet by mouth 2 (two) times daily. 07/01/20   [name], MD  TRUEplus Lancets 28G MISC Use as directed 12/28/19   [name], MD    Allergies Patient has no known allergies.  Family History  Problem Relation Age of Onset  . Heart disease Father   . Diabetes Mother   . HIV Brother   . Healthy Son   . Healthy Daughter     Social History Social History   Tobacco Use  . Smoking status: Current Every Day Smoker    Packs/day: 1.00    Years: 20.00    Pack years: 20.00    Types: Cigarettes  . Smokeless tobacco: Never Used  . Tobacco  comment: less than 1 pack per day  Vaping Use  . Vaping Use: Never used  Substance Use Topics  . Alcohol use: No  . Drug use: Yes    Frequency: 21.0 times per week    Types: Marijuana, Cocaine    Comment: no longer- Cocaine    Review of Systems  Constitutional: No fever/chills Eyes: No visual changes. ENT: No sore throat. Cardiovascular: Positive for chest tightness. Respiratory: Positive for shortness of breath. Gastrointestinal: No abdominal pain.  No nausea, no vomiting.  No diarrhea.  No constipation. Genitourinary: Negative for dysuria. Musculoskeletal: Negative for back pain. Skin: Negative for rash. Neurological: Negative for headaches, focal weakness or numbness.  ____________________________________________   PHYSICAL EXAM:  VITAL SIGNS: ED Triage Vitals  Enc Vitals Group     BP      Pulse      Resp      Temp      Temp src      SpO2      Weight      Height      Head Circumference      Peak Flow      Pain Score      Pain Loc      Pain Edu?      Excl. in [place]?     Constitutional: Alert and oriented. Eyes: Conjunctivae are normal. Head:  Atraumatic. Nose: No congestion/rhinnorhea. Mouth/Throat: Mucous membranes are moist. Neck: Normal ROM Cardiovascular: Normal rate, regular rhythm. Grossly normal heart sounds. Respiratory: Tachypneic with increased respiratory effort and accessory muscle use. Lungs with faint wheezing and very poor air movement throughout. Gastrointestinal: Soft and nontender. No distention. Genitourinary: deferred Musculoskeletal: No lower extremity tenderness nor edema. Neurologic:  Normal speech and language. No gross focal neurologic deficits are appreciated. Skin:  Skin is warm, dry and intact. No rash noted. Psychiatric: Mood and affect are normal. Speech and behavior are normal.  ____________________________________________   LABS (all labs ordered are listed, but only abnormal results are displayed)  Labs Reviewed  CBC WITH DIFFERENTIAL/PLATELET - Abnormal; Notable for the following components:      Result Value   WBC 15.3 (*)    Neutro Abs 11.4 (*)    Monocytes Absolute 1.3 (*)    All other components within normal limits  COMPREHENSIVE METABOLIC PANEL - Abnormal; Notable for the following components:   Glucose, Bld 42 (*)    BUN 25 (*)    Creatinine, Ser 2.16 (*)    Calcium 8.8 (*)    Albumin 3.4 (*)    GFR, Estimated 35 (*)    All other components within normal limits  BRAIN NATRIURETIC PEPTIDE - Abnormal; Notable for the following components:   B Natriuretic Peptide 1,617.4 (*)    All other components within normal limits  BLOOD GAS, VENOUS - Abnormal; Notable for the following components:   pO2, Ven <31.0 (*)    Bicarbonate 34.1 (*)    Acid-Base Excess 6.5 (*)    All other components within normal limits  CBG MONITORING, ED - Abnormal; Notable for the following components:   Glucose-Capillary 109 (*)    All other components within normal limits  TROPONIN I (HIGH SENSITIVITY) - Abnormal; Notable for the following components:   Troponin I (High Sensitivity) 31 (*)    All  other components within normal limits  SARS CORONAVIRUS 2 BY RT PCR (HOSPITAL ORDER, [facility])  CULTURE, BLOOD (ROUTINE X 2)  CULTURE, BLOOD (ROUTINE X 2)  LIPASE, BLOOD  MAGNESIUM  URINALYSIS, COMPLETE (UACMP) WITH MICROSCOPIC  LACTIC ACID, PLASMA  LACTIC ACID, PLASMA  PROCALCITONIN  PROCALCITONIN  CBG MONITORING, ED  CBG MONITORING, ED   ____________________________________________  EKG  ED ECG REPORT I, Blake Divine, the attending physician, personally viewed and interpreted this ECG.   Date: 07/03/2020  EKG Time: 20:27  Rate: 76  Rhythm: normal sinus rhythm  Axis: LAD  Intervals:nonspecific intraventricular conduction delay  ST&T Change: lateral T wave inversions similar to previous   PROCEDURES  Procedure(s) performed (including Critical Care):  Procedures   ____________________________________________   INITIAL IMPRESSION / ASSESSMENT AND PLAN / ED COURSE       60 year old male with past medical history of hypertension, hyperlipidemia, diabetes, COPD on 2 L, CHF, and CKD who presents to the ED with acutely worsening shortness of breath over the past 12 hours.  Patient noted to be in respiratory distress by EMS and CPAP was applied with improvement, patient remains with very poor air movement and faint wheezing throughout on arrival.  He was given 3 duo nebs as well as IV steroids and breathing seems to be gradually improving.  Chest x-ray shows bibasilar atelectasis versus pneumonia, given his white count and severe difficulty breathing, we will cover with antibiotics for now.  Patient also noted to be hypoglycemic on labs, although he remains awake and alert.  He was given some orange juice and blood glucose is now improved.  EKG shows no evidence of arrhythmia or ischemia, troponin mildly elevated but I have a low suspicion for ACS or PE at this time.  Plan for admission to hospitalist for acute respiratory failure secondary to COPD  and CHF.      ____________________________________________   FINAL CLINICAL IMPRESSION(S) / ED DIAGNOSES  Final diagnoses:  COPD exacerbation ([place])  Acute on chronic combined systolic and diastolic congestive heart failure [facility])     ED Discharge Orders    None       Note:  This document was prepared using Dragon voice recognition software and may include unintentional dictation errors.   Blake Divine, MD 07/03/20 2200

## 2020-07-03 NOTE — ED Notes (Addendum)
Miquel Dunn RN and Amy C, RN drawing labs. Taking patient off Bipap to see how pt does with 4L Nasal Canula. MD notified!

## 2020-07-03 NOTE — ED Notes (Signed)
Date and time results received: 07/03/20    Test: Lactic Acid Critical Value: 2.0  Name of Provider Notified: Clydene Laming, MD

## 2020-07-03 NOTE — ED Notes (Signed)
Pt given orange juice for CBG of 42

## 2020-07-03 NOTE — ED Notes (Signed)
Date and time results received: 07/03/20    Test: CBG Critical Value: 42  Name of Provider Notified: Charna Archer, MD

## 2020-07-03 NOTE — ED Triage Notes (Signed)
Pt to ED via EMS c/o of SOB. EMS states pt has hx of COPD and CHF. Wheezing in all fields. Pt initial O2 sat was in the 70s and pt was switched over to CPAP. Pt CBG was 91 and was given nitro paste on left upper chest. Pt arrived to ED a/o x 4. Pt also c/o of chest pain, and burning sensation in groin area.

## 2020-07-04 DIAGNOSIS — E782 Mixed hyperlipidemia: Secondary | ICD-10-CM | POA: Diagnosis not present

## 2020-07-04 DIAGNOSIS — J9811 Atelectasis: Secondary | ICD-10-CM | POA: Diagnosis not present

## 2020-07-04 DIAGNOSIS — I483 Typical atrial flutter: Secondary | ICD-10-CM | POA: Diagnosis not present

## 2020-07-04 DIAGNOSIS — G4733 Obstructive sleep apnea (adult) (pediatric): Secondary | ICD-10-CM | POA: Diagnosis present

## 2020-07-04 DIAGNOSIS — I110 Hypertensive heart disease with heart failure: Secondary | ICD-10-CM | POA: Diagnosis not present

## 2020-07-04 DIAGNOSIS — K219 Gastro-esophageal reflux disease without esophagitis: Secondary | ICD-10-CM | POA: Diagnosis not present

## 2020-07-04 DIAGNOSIS — I10 Essential (primary) hypertension: Secondary | ICD-10-CM

## 2020-07-04 DIAGNOSIS — I5023 Acute on chronic systolic (congestive) heart failure: Secondary | ICD-10-CM | POA: Diagnosis not present

## 2020-07-04 DIAGNOSIS — E1122 Type 2 diabetes mellitus with diabetic chronic kidney disease: Secondary | ICD-10-CM

## 2020-07-04 DIAGNOSIS — I480 Paroxysmal atrial fibrillation: Secondary | ICD-10-CM | POA: Diagnosis not present

## 2020-07-04 DIAGNOSIS — Z833 Family history of diabetes mellitus: Secondary | ICD-10-CM | POA: Diagnosis not present

## 2020-07-04 DIAGNOSIS — F17200 Nicotine dependence, unspecified, uncomplicated: Secondary | ICD-10-CM | POA: Diagnosis present

## 2020-07-04 DIAGNOSIS — J441 Chronic obstructive pulmonary disease with (acute) exacerbation: Secondary | ICD-10-CM

## 2020-07-04 DIAGNOSIS — I2510 Atherosclerotic heart disease of native coronary artery without angina pectoris: Secondary | ICD-10-CM | POA: Diagnosis not present

## 2020-07-04 DIAGNOSIS — Z79899 Other long term (current) drug therapy: Secondary | ICD-10-CM | POA: Diagnosis not present

## 2020-07-04 DIAGNOSIS — I4892 Unspecified atrial flutter: Secondary | ICD-10-CM | POA: Diagnosis not present

## 2020-07-04 DIAGNOSIS — N183 Chronic kidney disease, stage 3 unspecified: Secondary | ICD-10-CM | POA: Diagnosis not present

## 2020-07-04 DIAGNOSIS — J9621 Acute and chronic respiratory failure with hypoxia: Secondary | ICD-10-CM

## 2020-07-04 DIAGNOSIS — F1490 Cocaine use, unspecified, uncomplicated: Secondary | ICD-10-CM | POA: Diagnosis present

## 2020-07-04 DIAGNOSIS — I420 Dilated cardiomyopathy: Secondary | ICD-10-CM | POA: Diagnosis present

## 2020-07-04 DIAGNOSIS — N1832 Chronic kidney disease, stage 3b: Secondary | ICD-10-CM | POA: Diagnosis not present

## 2020-07-04 DIAGNOSIS — Z20822 Contact with and (suspected) exposure to covid-19: Secondary | ICD-10-CM | POA: Diagnosis present

## 2020-07-04 DIAGNOSIS — E875 Hyperkalemia: Secondary | ICD-10-CM | POA: Diagnosis not present

## 2020-07-04 DIAGNOSIS — Z9981 Dependence on supplemental oxygen: Secondary | ICD-10-CM | POA: Diagnosis not present

## 2020-07-04 DIAGNOSIS — I5043 Acute on chronic combined systolic (congestive) and diastolic (congestive) heart failure: Secondary | ICD-10-CM

## 2020-07-04 DIAGNOSIS — Z7982 Long term (current) use of aspirin: Secondary | ICD-10-CM | POA: Diagnosis not present

## 2020-07-04 DIAGNOSIS — I509 Heart failure, unspecified: Secondary | ICD-10-CM | POA: Insufficient documentation

## 2020-07-04 DIAGNOSIS — F32A Depression, unspecified: Secondary | ICD-10-CM | POA: Diagnosis present

## 2020-07-04 DIAGNOSIS — K5900 Constipation, unspecified: Secondary | ICD-10-CM | POA: Diagnosis not present

## 2020-07-04 DIAGNOSIS — E872 Acidosis: Secondary | ICD-10-CM | POA: Diagnosis present

## 2020-07-04 DIAGNOSIS — R0602 Shortness of breath: Secondary | ICD-10-CM | POA: Diagnosis not present

## 2020-07-04 DIAGNOSIS — R000 Tachycardia, unspecified: Secondary | ICD-10-CM | POA: Diagnosis not present

## 2020-07-04 DIAGNOSIS — Z8739 Personal history of other diseases of the musculoskeletal system and connective tissue: Secondary | ICD-10-CM

## 2020-07-04 DIAGNOSIS — M109 Gout, unspecified: Secondary | ICD-10-CM | POA: Diagnosis present

## 2020-07-04 DIAGNOSIS — I130 Hypertensive heart and chronic kidney disease with heart failure and stage 1 through stage 4 chronic kidney disease, or unspecified chronic kidney disease: Secondary | ICD-10-CM | POA: Diagnosis present

## 2020-07-04 LAB — BLOOD GAS, VENOUS
Acid-Base Excess: 6.5 mmol/L — ABNORMAL HIGH (ref 0.0–2.0)
Bicarbonate: 34.1 mmol/L — ABNORMAL HIGH (ref 20.0–28.0)
Delivery systems: POSITIVE
FIO2: 1
O2 Saturation: 30 %
Patient temperature: 37
RATE: 16 resp/min
pCO2, Ven: 59 mmHg (ref 44.0–60.0)
pH, Ven: 7.37 (ref 7.250–7.430)
pO2, Ven: <31 mmHg — CL (ref 32.0–45.0)

## 2020-07-04 LAB — CREATININE, SERUM
Creatinine, Ser: 2 mg/dL — ABNORMAL HIGH (ref 0.61–1.24)
GFR, Estimated: 38 mL/min — ABNORMAL LOW (ref >60)

## 2020-07-04 LAB — CBC
HCT: 45.3 % (ref 39.0–52.0)
Hemoglobin: 14.9 g/dL (ref 13.0–17.0)
MCH: 30.2 pg (ref 26.0–34.0)
MCHC: 32.9 g/dL (ref 30.0–36.0)
MCV: 91.7 fL (ref 80.0–100.0)
Platelets: 217 10*3/uL (ref 150–400)
RBC: 4.94 MIL/uL (ref 4.22–5.81)
RDW: 14.5 % (ref 11.5–15.5)
WBC: 13.4 10*3/uL — ABNORMAL HIGH (ref 4.0–10.5)
nRBC: 0 % (ref 0.0–0.2)

## 2020-07-04 LAB — URINE DRUG SCREEN, QUALITATIVE (ARMC ONLY)
Amphetamines, Ur Screen: NOT DETECTED
Barbiturates, Ur Screen: NOT DETECTED
Benzodiazepine, Ur Scrn: NOT DETECTED
Cannabinoid 50 Ng, Ur ~~LOC~~: POSITIVE — AB
Cocaine Metabolite,Ur ~~LOC~~: POSITIVE — AB
MDMA (Ecstasy)Ur Screen: NOT DETECTED
Methadone Scn, Ur: NOT DETECTED
Opiate, Ur Screen: NOT DETECTED
Phencyclidine (PCP) Ur S: NOT DETECTED
Tricyclic, Ur Screen: NOT DETECTED

## 2020-07-04 LAB — CBG MONITORING, ED
Glucose-Capillary: 141 mg/dL — ABNORMAL HIGH (ref 70–99)
Glucose-Capillary: 153 mg/dL — ABNORMAL HIGH (ref 70–99)
Glucose-Capillary: 168 mg/dL — ABNORMAL HIGH (ref 70–99)

## 2020-07-04 LAB — LACTIC ACID, PLASMA
Lactic Acid, Venous: 2.1 mmol/L (ref 0.5–1.9)
Lactic Acid, Venous: 2.8 mmol/L (ref 0.5–1.9)

## 2020-07-04 LAB — PROCALCITONIN: Procalcitonin: 0.12 ng/mL

## 2020-07-04 MED ORDER — INSULIN ASPART 100 UNIT/ML ~~LOC~~ SOLN
0.0000 [IU] | Freq: Three times a day (TID) | SUBCUTANEOUS | Status: DC
  Administered 2020-07-04: 1 [IU] via SUBCUTANEOUS
  Administered 2020-07-04 – 2020-07-05 (×2): 2 [IU] via SUBCUTANEOUS
  Administered 2020-07-06 – 2020-07-09 (×4): 1 [IU] via SUBCUTANEOUS
  Filled 2020-07-04 (×6): qty 1

## 2020-07-04 MED ORDER — SODIUM CHLORIDE 0.9% FLUSH
3.0000 mL | Freq: Two times a day (BID) | INTRAVENOUS | Status: DC
  Administered 2020-07-04 – 2020-07-08 (×8): 3 mL via INTRAVENOUS

## 2020-07-04 MED ORDER — MONTELUKAST SODIUM 10 MG PO TABS
10.0000 mg | ORAL_TABLET | Freq: Every day | ORAL | Status: DC
  Administered 2020-07-04 – 2020-07-08 (×5): 10 mg via ORAL
  Filled 2020-07-04 (×6): qty 1

## 2020-07-04 MED ORDER — IPRATROPIUM-ALBUTEROL 0.5-2.5 (3) MG/3ML IN SOLN
3.0000 mL | Freq: Four times a day (QID) | RESPIRATORY_TRACT | Status: DC
  Administered 2020-07-04 – 2020-07-05 (×6): 3 mL via RESPIRATORY_TRACT
  Filled 2020-07-04 (×6): qty 3

## 2020-07-04 MED ORDER — SODIUM CHLORIDE 0.9% FLUSH: 3.0000 mL | INTRAVENOUS | Status: DC | PRN

## 2020-07-04 MED ORDER — CARVEDILOL 25 MG PO TABS
25.0000 mg | ORAL_TABLET | Freq: Two times a day (BID) | ORAL | Status: DC
  Administered 2020-07-04 – 2020-07-05 (×3): 25 mg via ORAL
  Filled 2020-07-04 (×2): qty 4
  Filled 2020-07-04: qty 1

## 2020-07-04 MED ORDER — ACETAMINOPHEN 325 MG PO TABS: 650.0000 mg | ORAL_TABLET | ORAL | Status: DC | PRN

## 2020-07-04 MED ORDER — PREDNISONE 20 MG PO TABS
40.0000 mg | ORAL_TABLET | Freq: Every day | ORAL | Status: AC
  Administered 2020-07-04 – 2020-07-08 (×5): 40 mg via ORAL
  Filled 2020-07-04 (×5): qty 2

## 2020-07-04 MED ORDER — ASPIRIN EC 81 MG PO TBEC
81.0000 mg | DELAYED_RELEASE_TABLET | Freq: Every day | ORAL | Status: DC
  Administered 2020-07-04 – 2020-07-08 (×5): 81 mg via ORAL
  Filled 2020-07-04 (×4): qty 1

## 2020-07-04 MED ORDER — ATORVASTATIN CALCIUM 20 MG PO TABS
40.0000 mg | ORAL_TABLET | Freq: Every day | ORAL | Status: DC
  Administered 2020-07-04 – 2020-07-08 (×5): 40 mg via ORAL
  Filled 2020-07-04 (×5): qty 2

## 2020-07-04 MED ORDER — HEPARIN SODIUM (PORCINE) 5000 UNIT/ML IJ SOLN
5000.0000 [IU] | Freq: Three times a day (TID) | INTRAMUSCULAR | Status: DC
  Administered 2020-07-04 – 2020-07-05 (×3): 5000 [IU] via SUBCUTANEOUS
  Filled 2020-07-04 (×3): qty 1

## 2020-07-04 MED ORDER — SODIUM CHLORIDE 0.9 % IV SOLN: 250.0000 mL | INTRAVENOUS | Status: DC | PRN

## 2020-07-04 MED ORDER — ISOSORB DINITRATE-HYDRALAZINE 20-37.5 MG PO TABS
1.0000 | ORAL_TABLET | Freq: Three times a day (TID) | ORAL | Status: DC
  Administered 2020-07-04 – 2020-07-09 (×11): 1 via ORAL
  Filled 2020-07-04 (×21): qty 1

## 2020-07-04 MED ORDER — FUROSEMIDE 10 MG/ML IJ SOLN
40.0000 mg | Freq: Every day | INTRAMUSCULAR | Status: DC
  Administered 2020-07-04: 40 mg via INTRAVENOUS
  Filled 2020-07-04: qty 4

## 2020-07-04 MED ORDER — AZITHROMYCIN 500 MG PO TABS
500.0000 mg | ORAL_TABLET | Freq: Every day | ORAL | Status: DC
  Administered 2020-07-04 – 2020-07-08 (×5): 500 mg via ORAL
  Filled 2020-07-04 (×5): qty 1

## 2020-07-04 MED ORDER — BUDESONIDE 0.5 MG/2ML IN SUSP
0.5000 mg | Freq: Two times a day (BID) | RESPIRATORY_TRACT | Status: DC
  Administered 2020-07-04 – 2020-07-09 (×8): 0.5 mg via RESPIRATORY_TRACT
  Filled 2020-07-04 (×9): qty 2

## 2020-07-04 MED ORDER — FLUTICASONE PROPIONATE 50 MCG/ACT NA SUSP
1.0000 | Freq: Every day | NASAL | Status: DC | PRN
  Filled 2020-07-04: qty 16

## 2020-07-04 MED ORDER — ALLOPURINOL 100 MG PO TABS
200.0000 mg | ORAL_TABLET | Freq: Every day | ORAL | Status: DC
  Administered 2020-07-04 – 2020-07-09 (×6): 200 mg via ORAL
  Filled 2020-07-04 (×6): qty 2

## 2020-07-04 MED ORDER — FUROSEMIDE 10 MG/ML IJ SOLN
40.0000 mg | Freq: Two times a day (BID) | INTRAMUSCULAR | Status: DC
  Administered 2020-07-04 – 2020-07-06 (×4): 40 mg via INTRAVENOUS
  Filled 2020-07-04 (×5): qty 4

## 2020-07-04 MED ORDER — FLUTICASONE PROPIONATE 50 MCG/ACT NA SUSP
2.0000 | Freq: Every day | NASAL | Status: DC
  Administered 2020-07-04 – 2020-07-09 (×4): 2 via NASAL
  Filled 2020-07-04 (×2): qty 16

## 2020-07-04 MED ORDER — ENOXAPARIN SODIUM 60 MG/0.6ML ~~LOC~~ SOLN
60.0000 mg | SUBCUTANEOUS | Status: DC
  Administered 2020-07-04: 60 mg via SUBCUTANEOUS
  Filled 2020-07-04: qty 0.6

## 2020-07-04 MED ORDER — ASPIRIN EC 81 MG PO TBEC
81.0000 mg | DELAYED_RELEASE_TABLET | Freq: Every day | ORAL | Status: DC
  Administered 2020-07-04: 81 mg via ORAL
  Filled 2020-07-04 (×2): qty 1

## 2020-07-04 MED ORDER — LORATADINE 10 MG PO TABS
10.0000 mg | ORAL_TABLET | Freq: Every day | ORAL | Status: DC
  Administered 2020-07-04 – 2020-07-09 (×6): 10 mg via ORAL
  Filled 2020-07-04 (×7): qty 1

## 2020-07-04 MED ORDER — SACUBITRIL-VALSARTAN 24-26 MG PO TABS
1.0000 | ORAL_TABLET | Freq: Two times a day (BID) | ORAL | Status: DC
  Administered 2020-07-04 – 2020-07-05 (×4): 1 via ORAL
  Filled 2020-07-04 (×6): qty 1

## 2020-07-04 MED ORDER — ONDANSETRON HCL 4 MG/2ML IJ SOLN
4.0000 mg | Freq: Four times a day (QID) | INTRAMUSCULAR | Status: DC | PRN
  Administered 2020-07-06: 4 mg via INTRAVENOUS
  Filled 2020-07-04: qty 2

## 2020-07-04 MED ORDER — PANTOPRAZOLE SODIUM 40 MG PO TBEC
40.0000 mg | DELAYED_RELEASE_TABLET | Freq: Every day | ORAL | Status: DC
  Administered 2020-07-04 – 2020-07-09 (×6): 40 mg via ORAL
  Filled 2020-07-04 (×6): qty 1

## 2020-07-04 MED ORDER — MOMETASONE FURO-FORMOTEROL FUM 200-5 MCG/ACT IN AERO
2.0000 | INHALATION_SPRAY | Freq: Two times a day (BID) | RESPIRATORY_TRACT | Status: DC
Start: 2020-07-04 — End: 2020-07-04

## 2020-07-04 NOTE — Consult Note (Addendum)
° °  Heart Failure Nurse Navigator Note  HFrEF 20-25%.  Mild concentric LVH. Grade II diastolic dysfunction.  Mild bi atrial enlargement.  Elevated right ventricular systolic pressure.  He presented to the emergency room with increasing shortness of breath chest tightness and a cough.  O2 saturations in the field were in the 70s.  Comorbidities:  COPD Chronic kidney disease stage III Depression Hypertension Hyperlipidemia Gout Diabetes Obstructive sleep apnea   Medications:  Aspirin 81 mg daily Atorvastatin 40 mg every evening Carvedilol 25 mg twice a day Furosemide 40 mg IV daily Isosorbide-hydralazine 20/37.5 mg 1 tablet 3 times daily Entresto 24/26 mg 2 times a day  Question if he would not benefit from addition of SGLT2 I? Labs:  Sodium 140, potassium 4.5, chloride 101, CO2 28, BUN 25, creatinine 2.16, lactic acid 2.0, BNP 1617 Weight is 122.9 kg BMI 36.75 Blood pressure 169/107   Assessment:  General-he is awake and alert sitting up on the side of the gurney in the emergency room, in no acute distress.  HEENT- no JVD, pupils are equal.  Cardiac-heart tones of regular rate and rhythm no murmurs gallops or rubs appreciated  Chest-breath sounds with fine crackles in the bases posteriorly.  Scattered inspiratory and expiratory wheezes throughout the lung fields.  Abdomen-rounded, with positive bowel sounds   Musculoskeletal-no lower extremity edema noted.  Neurologic- speech is clear moves all extremities without difficulty.  Psych- is pleasant and appropriate although states he is frustrated with getting conflicting stories today about the condition of his heart.   .  Initial visit with patient.  Discussed how he takes care of himself at home-he does not weigh himself daily,does  not limit fluid intake as per his words he states that he drinks a lot.  Also states he does not follow a low-sodium diet.  The importance of daily weights and recording so that he  could report a 2 to 3 pound weight gain overnight or 5 pounds within a week.  States that he is compliant with his medications.  But that he does continue to smoke.  Also states that he takes cocaine which he uses for pain.  Discussed low-sodium diet and foods to avoid.  States that he does not eat out in restaurants fast food.  He lives by himself and does the cooking.  Also talked about the out patient heart failure clinic.  Was given written materials of living with heart failure, the zone magnet and information about the heart failure clinic.   Pricilla Riffle RN [facility]

## 2020-07-04 NOTE — Consult Note (Signed)
[facility] Consultation Note  Patient ID: Chris [name], MRN: [id number], DOB/AGE: 58/27/1962 58 y.o. Admit date: 07/03/2020   Date of Consult: 07/04/2020 Primary Physician: [name], MD Primary Cardiologist: [facility]  Chief Complaint:  Chief Complaint  Patient presents with  . Shortness of Breath   Reason for Consult: Heart failure  HPI: 60 y.o. male with known severe LV systolic dysfunction with ejection fraction of 20% sleep apnea hypertension hyperlipidemia and COPD. The patient has had appropriate previous medication management for his congestive heart failure and has had also COPD. He has had worsening shortness of breath weakness and fatigue and other symptoms in the last several days for which she came to the emergency room. At that time the patient had an EKG showing normal sinus rhythm with anterolateral T wave inversions a chest x-ray showing bibasilar atelectasis but no evidence of edema glomerular filtration rate of 35 troponin of 31 BNP of 1617. Most of his symptoms are more cough and congestion consistent with possible exacerbation of COPD at this time. He has received intravenous Lasix with reasonable urine output and improvement of shortness of breath and his other guideline medical therapy for cardiomyopathy has been continued. He has been placed on antibiotics for possible COPD exacerbation which will help as well. Currently he is not having any chest pain without evidence of acute coronary syndrome  Past Medical History:  Diagnosis Date  . CHF (congestive heart failure) ([place])   . Chronic kidney disease   . COPD (chronic obstructive pulmonary disease) ([place])   . Coronary artery disease   . Depression   . Diabetes mellitus without complication ([place])   . GERD (gastroesophageal reflux disease)   . Gout   . Hypertension   . Influenza A with respiratory manifestations   . Mental disorder       Surgical History:  Past Surgical History:   Procedure Laterality Date  . ANKLE SURGERY    . CARDIAC CATHETERIZATION    . HERNIA REPAIR     x2  . SHOULDER SURGERY       Home Meds: Prior to Admission medications   Medication Sig Start Date End Date Taking? Authorizing Provider  albuterol (PROVENTIL) (2.5 MG/3ML) 0.083% nebulizer solution Take 3 mLs by nebulization every 6 (six) hours as needed for shortness of breath. 04/17/20  Yes [name], MD  albuterol (VENTOLIN HFA) 108 (90 Base) MCG/ACT inhaler Inhale 2 puffs into the lungs every 6 (six) hours as needed for wheezing or shortness of breath. 04/17/20  Yes [name], MD  allopurinol (ZYLOPRIM) 100 MG tablet Take 2 tablets (200 mg total) by mouth daily. 07/01/20  Yes [name], MD  atorvastatin (LIPITOR) 40 MG tablet Take 1 tablet (40 mg total) by mouth daily at 6 PM. 07/01/20  Yes [name], MD  budesonide-formoterol [place]) 160-4.5 MCG/ACT inhaler Inhale 2 puffs into the lungs 2 (two) times daily. 04/17/20  Yes [name], MD  carvedilol (COREG) 25 MG tablet Take 1 tablet (25 mg total) by mouth 2 (two) times daily with a meal. 07/01/20  Yes [name], MD  diclofenac Sodium (VOLTAREN) 1 % GEL Apply 2 g topically 4 (four) times daily. Use on elbow pain 05/05/20  Yes [name], MD  fluticasone [facility]) 50 MCG/ACT nasal spray Place 1 spray into both nostrils daily as needed for allergies or rhinitis. 05/06/20  Yes Swords, Darrick Penna, MD  furosemide (LASIX) 40 MG tablet Take 1.5 tablets (  60 mg total) by mouth daily. 07/01/20  Yes [name], MD  glipiZIDE (GLUCOTROL) 5 MG tablet Take 0.5 tablets (2.5 mg total) by mouth 2 (two) times daily before a meal. 07/01/20  Yes [name], MD  isosorbide-hydrALAZINE (BIDIL) 20-37.5 MG tablet Take 1 tablet by mouth 3 (three) times daily. 07/01/20  Yes [name], MD  montelukast (SINGULAIR) 10 MG tablet Take 1 tablet (10 mg total) by mouth at bedtime. 07/01/20 09/29/20 Yes  [name], MD  predniSONE (DELTASONE) 20 MG tablet Take 2 tabs PO daily x 2 days, then 1.5 tab daily x 2 days then 1 tab x2 days then 1/2 tab x 2 days 07/01/20  Yes [name], MD  sacubitril-valsartan (ENTRESTO) 24-26 MG Take 1 tablet by mouth 2 (two) times daily. 07/01/20  Yes [name], MD  aspirin 81 MG EC tablet Take 1 tablet (81 mg total) by mouth daily. 07/01/20   [name], MD    Inpatient Medications:  . allopurinol  200 mg Oral Daily  . aspirin EC  81 mg Oral Daily  . atorvastatin  40 mg Oral q1800  . azithromycin  500 mg Oral Daily  . carvedilol  25 mg Oral BID WC  . enoxaparin (LOVENOX) injection  60 mg Subcutaneous Q24H  . fluticasone  2 spray Each Nare Daily  . furosemide  40 mg Intravenous Daily  . insulin aspart  0-9 Units Subcutaneous TID WC  . ipratropium-albuterol  3 mL Nebulization Q6H  . isosorbide-hydrALAZINE  1 tablet Oral TID  . loratadine  10 mg Oral Daily  . mometasone-formoterol  2 puff Inhalation BID  . montelukast  10 mg Oral QHS  . pantoprazole  40 mg Oral Q0600  . predniSONE  40 mg Oral Q breakfast  . sacubitril-valsartan  1 tablet Oral BID  . sodium chloride flush  3 mL Intravenous Q12H  . sodium chloride flush  3 mL Intravenous Q12H   . sodium chloride    . sodium chloride      Allergies: No Known Allergies  Social History   Socioeconomic History  . Marital status: Single    Spouse name: Not on file  . Number of children: Not on file  . Years of education: Not on file  . Highest education level: Not on file  Occupational History  . Not on file  Tobacco Use  . Smoking status: Current Every Day Smoker    Packs/day: 1.00    Years: 20.00    Pack years: 20.00    Types: Cigarettes  . Smokeless tobacco: Never Used  . Tobacco comment: less than 1 pack per day  Vaping Use  . Vaping Use: Never used  Substance and Sexual Activity  . Alcohol use: No  . Drug use: Yes    Frequency: 21.0 times per week     Types: Marijuana, Cocaine    Comment: no longer- Cocaine  . Sexual activity: Not on file  Other Topics Concern  . Not on file  Social History Narrative   ** Merged History Encounter **       Social Determinants of Health   Financial Resource Strain: Not on file  Food Insecurity: Food Insecurity Present  . Worried About Charity fundraiser in the Last Year: Often true  . Ran Out of Food in the Last Year: Often true  Transportation Needs: Unmet Transportation Needs  . Lack of Transportation (Medical): Yes  . Lack of Transportation (Non-Medical):  Yes  Physical Activity: Not on file  Stress: Not on file  Social Connections: Not on file  Intimate Partner Violence: Not on file     Family History  Problem Relation Age of Onset  . Heart disease Father   . Diabetes Mother   . HIV Brother   . Healthy Son   . Healthy Daughter      Review of Systems Positive for cough congestion Negative for: General:  chills, fever, night sweats or weight changes.  Cardiovascular: PND orthopnea syncope dizziness  Dermatological skin lesions rashes Respiratory: Positive for cough congestion Urologic: Frequent urination urination at night and hematuria Abdominal: negative for nausea, vomiting, diarrhea, bright red blood per rectum, melena, or hematemesis Neurologic: negative for visual changes, and/or hearing changes  All other systems reviewed and are otherwise negative except as noted above.  Labs: No results for input(s): CKTOTAL, CKMB, TROPONINI in the last 72 hours. Lab Results  Component Value Date   WBC 13.4 (H) 07/04/2020   HGB 14.9 07/04/2020   HCT 45.3 07/04/2020   MCV 91.7 07/04/2020   PLT 217 07/04/2020    Recent Labs  Lab 07/03/20 2031 07/04/20 0246  NA 140  --   K 4.5  --   CL 101  --   CO2 28  --   BUN 25*  --   CREATININE 2.16* 2.00*  CALCIUM 8.8*  --   PROT 7.1  --   BILITOT 0.7  --   ALKPHOS 77  --   ALT 20  --   AST 15  --   GLUCOSE 42*  --    Lab Results   Component Value Date   CHOL 176 12/25/2019   HDL 37 (L) 12/25/2019   LDLCALC 121 (H) 12/25/2019   TRIG 91 12/25/2019   No results found for: DDIMER  Radiology/Studies:  DG Chest 1 View  Result Date: 06/16/2020 CLINICAL DATA:  Chest pain and short of breath EXAM: CHEST  1 VIEW COMPARISON:  05/15/2020 FINDINGS: Cardiac enlargement without heart failure or edema. Hypoventilation with bibasilar atelectasis/infiltrate. No significant effusion. IMPRESSION: Hypoventilation with bibasilar atelectasis/infiltrate. Electronically Signed   By: Franchot Gallo M.D.   On: 06/16/2020 07:54   DG Chest 2 View  Result Date: 06/26/2020 CLINICAL DATA:  Shortness of breath. EXAM: CHEST - 2 VIEW COMPARISON:  06/16/2020 FINDINGS: The cardio pericardial silhouette is enlarged. There is pulmonary vascular congestion without overt pulmonary edema. Diffuse bilateral interstitial opacity suggest pulmonary edema. No substantial pleural effusion. The visualized bony structures of the thorax show no acute abnormality. IMPRESSION: Cardiomegaly with vascular congestion and probable interstitial edema. Electronically Signed   By: Misty Stanley M.D.   On: 06/26/2020 15:44   DG Chest Portable 1 View  Result Date: 07/03/2020 CLINICAL DATA:  Shortness of breath EXAM: PORTABLE CHEST 1 VIEW COMPARISON:  06/26/2020 FINDINGS: Cardiac shadow is stable. Aortic calcifications are again seen. Lungs are well aerated bilaterally. Mild vascular congestion is again seen. Bibasilar atelectatic changes are noted. Artifact is noted overlying the anterior chest. No other focal abnormality is seen. IMPRESSION: Bibasilar atelectasis. Electronically Signed   By: Inez Catalina M.D.   On: 07/03/2020 20:44   ECHOCARDIOGRAM COMPLETE  Result Date: 06/17/2020    ECHOCARDIOGRAM REPORT   Patient Name:   DUNG SALINGER Date of Exam: 06/17/2020 Medical Rec #:  [id number]       Height:       72.0 in Accession #:    [id number]  Weight:       264.4 lb Date of  Birth:  1960/09/06       BSA:          2.399 m Patient Age:    76 years        BP:           111/80 mmHg Patient Gender: M               HR:           71 bpm. Exam Location:  Inpatient Procedure: 2D Echo, Cardiac Doppler and Color Doppler Indications:    CHF  History:        Patient has prior history of Echocardiogram examinations, most                 recent 12/25/2019. CHF, CAD, COPD, Signs/Symptoms:Chest Pain;                 Risk Factors:Hypertension, Diabetes, Obesity and Current Smoker.  Sonographer:    Dustin Flock Referring Phys: [id number] [place]  1. Left ventricular ejection fraction, by estimation, is 20 to 25%. The left ventricle has severely decreased function. The left ventricle demonstrates global hypokinesis. The left ventricular internal cavity size was mildly to moderately dilated. There  is mild concentric left ventricular hypertrophy. Left ventricular diastolic parameters are consistent with Grade III diastolic dysfunction (restrictive). Elevated left atrial pressure.  2. Right ventricular systolic function is moderately reduced. The right ventricular size is moderately enlarged. There is mildly elevated pulmonary artery systolic pressure. The estimated right ventricular systolic pressure is 123XX123 mmHg.  3. Left atrial size was mildly dilated.  4. Right atrial size was mildly dilated.  5. The mitral valve is grossly normal. Mild mitral valve regurgitation. No evidence of mitral stenosis.  6. The aortic valve is tricuspid. Aortic valve regurgitation is not visualized. No aortic stenosis is present.  7. The inferior vena cava is dilated in size with <50% respiratory variability, suggesting right atrial pressure of 15 mmHg. Comparison(s): Changes from prior study are noted. EF reduced to 20-25%. Moderately dilated RV with moderately reduced function. Grade 3 DD with elevated RVSP. FINDINGS  Left Ventricle: Left ventricular ejection fraction, by estimation, is 20 to 25%. The  left ventricle has severely decreased function. The left ventricle demonstrates global hypokinesis. The left ventricular internal cavity size was mildly to moderately dilated. There is mild concentric left ventricular hypertrophy. Left ventricular diastolic parameters are consistent with Grade III diastolic dysfunction (restrictive). Elevated left atrial pressure. Right Ventricle: The right ventricular size is moderately enlarged. No increase in right ventricular wall thickness. Right ventricular systolic function is moderately reduced. There is mildly elevated pulmonary artery systolic pressure. The tricuspid regurgitant velocity is 2.62 m/s, and with an assumed right atrial pressure of 15 mmHg, the estimated right ventricular systolic pressure is 123XX123 mmHg. Left Atrium: Left atrial size was mildly dilated. Right Atrium: Right atrial size was mildly dilated. Pericardium: Trivial pericardial effusion is present. Mitral Valve: The mitral valve is grossly normal. Mild mitral valve regurgitation. No evidence of mitral valve stenosis. Tricuspid Valve: The tricuspid valve is grossly normal. Tricuspid valve regurgitation is trivial. No evidence of tricuspid stenosis. Aortic Valve: The aortic valve is tricuspid. Aortic valve regurgitation is not visualized. No aortic stenosis is present. Pulmonic Valve: The pulmonic valve was grossly normal. Pulmonic valve regurgitation is not visualized. No evidence of pulmonic stenosis. Aorta: The aortic root is normal in size and structure. Venous: The inferior  vena cava is dilated in size with less than 50% respiratory variability, suggesting right atrial pressure of 15 mmHg. IAS/Shunts: The atrial septum is grossly normal.  LEFT VENTRICLE PLAX 2D LVIDd:         6.50 cm  Diastology LVIDs:         5.70 cm  LV e' medial:    3.05 cm/s LV PW:         1.50 cm  LV E/e' medial:  29.6 LV IVS:        1.30 cm  LV e' lateral:   4.24 cm/s LVOT diam:     3.00 cm  LV E/e' lateral: 21.3 LV SV:          115 LV SV Index:   48 LVOT Area:     7.07 cm  RIGHT VENTRICLE RV Basal diam:  4.80 cm RV S prime:     14.60 cm/s TAPSE (M-mode): 2.1 cm LEFT ATRIUM              Index       RIGHT ATRIUM           Index LA diam:        4.60 cm  1.92 cm/m  RA Area:     35.10 cm LA Vol (A2C):   103.0 ml 42.94 ml/m RA Volume:   149.00 ml 62.11 ml/m LA Vol (A4C):   86.8 ml  36.18 ml/m LA Biplane Vol: 99.1 ml  41.31 ml/m  AORTIC VALVE LVOT Vmax:   97.50 cm/s LVOT Vmean:  60.600 cm/s LVOT VTI:    0.163 m  AORTA Ao Root diam: 3.80 cm MITRAL VALVE               TRICUSPID VALVE MV Area (PHT): 3.42 cm    TR Peak grad:   27.5 mmHg MV Decel Time: 222 msec    TR Vmax:        262.00 cm/s MV E velocity: 90.40 cm/s MV A velocity: 31.70 cm/s  SHUNTS MV E/A ratio:  2.85        Systemic VTI:  0.16 m                            Systemic Diam: 3.00 cm Eleonore Chiquito MD Electronically signed by Eleonore Chiquito MD Signature Date/Time: 06/17/2020/4:57:34 PM    Final     EKG: Normal sinus rhythm with anterior lateral T wave inversions  Weights: Filed Weights   07/03/20 2031  Weight: 122.9 kg     Physical Exam: Blood pressure (!) 169/107, pulse 97, temperature 98.3 F (36.8 C), temperature source Axillary, resp. rate 16, height 6' (1.829 m), weight 122.9 kg, SpO2 97 %. Body mass index is 36.75 kg/m. General: Well developed, well nourished, in no acute distress. Head eyes ears nose throat: Normocephalic, atraumatic, sclera non-icteric, no xanthomas, nares are without discharge. No apparent thyromegaly and/or mass  Lungs: Normal respiratory effort. Diffuse wheezes, no rales, some rhonchi.  Heart: RRR with normal S1 S2. no murmur gallop, no rub, PMI is normal size and placement, carotid upstroke normal without bruit, jugular venous pressure is normal Abdomen: Soft, non-tender, non-distended with normoactive bowel sounds. No hepatomegaly. No rebound/guarding. No obvious abdominal masses. Abdominal aorta is normal size without  bruit Extremities: Trace o edema. no cyanosis, no clubbing, no ulcers  Peripheral : 2+ bilateral upper extremity pulses, 2+ bilateral femoral pulses, 2+ bilateral dorsal pedal pulse Neuro: Alert and oriented.  No facial asymmetry. No focal deficit. Moves all extremities spontaneously. Musculoskeletal: Normal muscle tone without kyphosis Psych:  Responds to questions appropriately with a normal affect.    Assessment: 60 year old male with acute on chronic obstructive pulmonary disease likely causing acute on chronic systolic dysfunction congestive heart failure in the setting of chronic kidney disease sleep apnea and severe LV systolic dysfunction without evidence of acute coronary syndrome  Plan: 1. Continue intravenous Lasix for acute on chronic systolic dysfunction, pulmonary disease with fluid accumulation 2. Supportive care and antibiotics and inhalers for acute on chronic COPD 3. No further cardiac diagnostics necessary at this time 4. Reinstatement of appropriate medication management for cardiomyopathy 5. Follow-up for improvement  Signed, Corey Skains M.D. [facility] 07/04/2020, 11:39 AM

## 2020-07-04 NOTE — ED Notes (Signed)
Patient given drink, warm blankets, lights dimmed for comfort.

## 2020-07-04 NOTE — ED Notes (Addendum)
Patient ate his supper tray, but it didn't have gravy on it so dietary is aware and bringing another tray. Patient moves easily on the stretcher. Patient has a congestive cough.

## 2020-07-04 NOTE — ED Notes (Signed)
Breakfast tray at bedside 

## 2020-07-04 NOTE — Progress Notes (Addendum)
PROGRESS NOTE    Chris [name]  GNF:[id number] DOB: July 27, 1960 DOA: 07/03/2020 PCP: [name], MD    Chief Complaint  Patient presents with  . Shortness of Breath    Brief Narrative:  HPI per Dr. Ninetta Lights is a 60 y.o. male with medical history significant for diastolic and systolic heart failure, COPD on chronic oxygen, CKD 3, depression, hypertension, hyperlipidemia, gout, diabetes who presents with chest pain and worsening shortness of breat. Per chart in the [place] sat was in the 70s and pt was switched over to CPAP. Patient had note has interim history of recent admission 06/16/2020-06/18/2020 at which time patient was diagnosed with Acute CHF exacerbation with acute hypoxic respiratory , patient was unfortunately at that time discharged AMA. He then presented to ED on 1/13 with sob/wheezing from jail he was found to have acute CHF he was treated in ed and lasix home dosage was increased and he was discharged to outpatient care with a course of steroids as well.   He now returns with similar complaint of acute onset of sob, wheezing, chest tightness x for less than one day.He also has associated complaints of cough, as well as tenderness of abdomen s/p coughing episodes. He notes no f/c/n/v/d/.   ED Course:  98.3, bp 134/88, hr 79, rr 19 , sat 98% on bipap  Wbc: 15.3,hgb 15.9, pmn 11.4, mag 2  Na 140, K 4.5, cl 101, glu 42, cr 2.16 at around base ce 31 [id number] less than prior 2000's UA :neg Vbg: 7.37/59 covid resp panel:neg  tx solumedrol, douneb Lasix 40mg   lactic 2  procal 0.15 tx , ctx/ azithromycin  ekg nsr lvh, with ant/lateral nonspecific twave findings  Assessment & Plan:   Principal Problem:   Acute and chronic respiratory failure (acute-on-chronic) (HCC) Active Problems:   Chronic systolic heart failure (HCC)   Mixed hyperlipidemia   Essential hypertension   CKD (chronic kidney disease) stage 3, GFR 30-59 ml/min (HCC)   Tobacco  dependence   History of gout   Acute on chronic combined systolic and diastolic CHF (congestive heart failure) (HCC)   Type 2 diabetes mellitus with stage 3 chronic kidney disease (HCC)   COPD with acute exacerbation (HCC)  1 acute respiratory failure with hypoxia secondary to acute on chronic combined systolic and diastolic heart failure exacerbation and possible acute COPD exacerbation Patient presenting with worsening shortness of breath, noted to be hypoxic with sats in the 70s, complaining of chest tightness, noted to have crackles on examination.  Patient with prior 2D echo with EF of 20 to 25% with global hypokinesis.  Cardiac enzymes elevated but flattened.  2D echo not repeated as one recently done.  Patient currently on Lasix 40 mg IV daily with urine output of 1.5 L since admission.  We will increase Lasix to 40 mg IV every 12 hours.  Patient with clinical improvement currently off BiPAP with sats of 93 to 95% on room air currently.  Change BiPAP to CPAP nightly due to history of OSA.  Resume cardiac medications of Coreg, BiDil, Entresto, aspirin, statin.  Patient on bronchodilators, steroids, azithromycin which we will continue.  Cardiology consulted for further evaluation and management.  2.  Mild acute exacerbation of COPD Patient presenting with shortness of breath with chest tightness, some crackles, concern for possible COPD exacerbation.  Patient still with complaints of chest tightness.  Patient given a dose of IV Solu-Medrol currently on prednisone 40 mg daily which we will  continue.  Continue scheduled DuoNebs.  Placed on Claritin, PPI, Flonase, Pulmicort nebs, azithromycin.  Tobacco cessation stressed to patient.  Incentive spirometry.  Flutter valve.  Follow-up.  3.  Elevated lactic acid level/leukocytosis Questionable etiology.  Patient currently afebrile.  Urinalysis unremarkable.  Blood cultures ordered and pending.  On empiric azithromycin.  Repeat lactic acid level in the  morning.  Follow.  4.  Chronic kidney disease stage IIIb Baseline creatinine approximately 1.9-2.  Currently at baseline.  Monitor with diuresis.  Follow.  5.  Hypertension Resume home regimen of Entresto, Coreg, BiDi.  Continue IV Lasix.  Follow.  6.  Hyperlipidemia Resume Lipitor.  7.  OSA CPAP nightly  8.  GERD PPI.  9.  History of gout Resume allopurinol.  10.  Polysubstance abuse Cessation stressed to patient.  TOC consult.    DVT prophylaxis: Heparin Code Status: Full Family Communication: Updated patient.  No family at bedside. Disposition:   Status is: Inpatient    Dispo: The patient is from: Home              Anticipated d/c is to: Home              Anticipated d/c date is: 2-3 days              Patient currently on IV Lasix, being diuresed, not stable for discharge.       Consultants:   Cardiology: Pending  Procedures:   Chest x-ray 07/03/2020  Antimicrobials:   Oral azithromycin 07/04/2020>>>  IV azithromycin 1/20 /2022x1 dose  IV Rocephin 1/20/ 2022x1 dose   Subjective: Sitting up on the side of the bed.  Complains of chest tightness.  Some shortness of breath.  No abdominal pain.  Sats of 95% on room air.  Complaining of left lower abdominal discomfort.  States abdomen feels like it is burning.  Objective: Vitals:   07/04/20 0801 07/04/20 0900 07/04/20 0930 07/04/20 1000  BP:  (!) 173/118 (!) 174/107 (!) 169/107  Pulse:  88 84 97  Resp:      Temp:      TempSrc:      SpO2: 95% 98% 95% 97%  Weight:      Height:        Intake/Output Summary (Last 24 hours) at 07/04/2020 1741 Last data filed at 07/04/2020 1059 Gross per 24 hour  Intake --  Output 1500 ml  Net -1500 ml   Filed Weights   07/03/20 2031  Weight: 122.9 kg    Examination:  General exam: Appears calm and comfortable  Respiratory system: Some decreased breath sounds in the bases.  Some crackles in the bases.  Fair air movement.   Cardiovascular system: S1 & S2  heard, RRR. No JVD, murmurs, rubs, gallops or clicks. No pedal edema. Gastrointestinal system: Abdomen is nondistended, soft and some tenderness to deep palpation in the left lower quadrant.  No rebound.  No guarding Central nervous system: Alert and oriented. No focal neurological deficits. Extremities: Symmetric 5 x 5 power. Skin: No rashes, lesions or ulcers Psychiatry: Judgement and insight appear normal. Mood & affect appropriate.     Data Reviewed: I have personally reviewed following labs and imaging studies  CBC: Recent Labs  Lab 07/03/20 2031 07/04/20 0246  WBC 15.3* 13.4*  NEUTROABS 11.4*  --   HGB 15.9 14.9  HCT 49.8 45.3  MCV 92.7 91.7  PLT 234 217    Basic Metabolic Panel: Recent Labs  Lab 07/03/20 2031 07/04/20 0246  NA  140  --   K 4.5  --   CL 101  --   CO2 28  --   GLUCOSE 42*  --   BUN 25*  --   CREATININE 2.16* 2.00*  CALCIUM 8.8*  --   MG 2.0  --     GFR: Estimated Creatinine Clearance: 55.2 mL/min (A) (by C-G formula based on SCr of 2 mg/dL (H)).  Liver Function Tests: Recent Labs  Lab 07/03/20 2031  AST 15  ALT 20  ALKPHOS 77  BILITOT 0.7  PROT 7.1  ALBUMIN 3.4*    CBG: Recent Labs  Lab 07/03/20 2150 07/03/20 2314 07/04/20 0044 07/04/20 1310 07/04/20 1644  GLUCAP 109* 136* 168* 153* 141*     Recent Results (from the past 240 hour(s))  SARS Coronavirus 2 by RT PCR (hospital order, performed in [facility] hospital lab) Nasopharyngeal Nasopharyngeal Swab     Status: None   Collection Time: 07/03/20  8:34 PM   Specimen: Nasopharyngeal Swab  Result Value Ref Range Status   SARS Coronavirus 2 NEGATIVE NEGATIVE Final    Comment: (NOTE) SARS-CoV-2 target nucleic acids are NOT DETECTED.  The SARS-CoV-2 RNA is generally detectable in upper and lower respiratory specimens during the acute phase of infection. The lowest concentration of SARS-CoV-2 viral copies this assay can detect is 250 copies / mL. A negative result does not  preclude SARS-CoV-2 infection and should not be used as the sole basis for treatment or other patient management decisions.  A negative result may occur with improper specimen collection / handling, submission of specimen other than nasopharyngeal swab, presence of viral mutation(s) within the areas targeted by this assay, and inadequate number of viral copies (<250 copies / mL). A negative result must be combined with clinical observations, patient history, and epidemiological information.  Fact Sheet for Patients:   StrictlyIdeas.no  Fact Sheet for Healthcare Providers: BankingDealers.co.za  This test is not yet approved or  cleared by the [place] FDA and has been authorized for detection and/or diagnosis of SARS-CoV-2 by FDA under an Emergency Use Authorization (EUA).  This EUA will remain in effect (meaning this test can be used) for the duration of the COVID-19 declaration under Section 564(b)(1) of the Act, 21 U.S.C. section 360bbb-3(b)(1), unless the authorization is terminated or revoked sooner.  Performed at [facility], [address] [place], [place] [address]   Culture, blood (routine x 2)     Status: None (Preliminary result)   Collection Time: 07/03/20 10:45 PM   Specimen: BLOOD  Result Value Ref Range Status   Specimen Description BLOOD RIGHT ASSIST CONTROL  Final   Special Requests   Final    BOTTLES DRAWN AEROBIC AND ANAEROBIC Blood Culture adequate volume   Culture   Final    NO GROWTH < 12 HOURS Performed at [facility], [address], [place], [place] [address]    Report Status PENDING  Incomplete  Culture, blood (routine x 2)     Status: None (Preliminary result)   Collection Time: 07/03/20 10:45 PM   Specimen: BLOOD  Result Value Ref Range Status   Specimen Description BLOOD LEFT HAND  Final   Special Requests   Final    BOTTLES DRAWN AEROBIC AND ANAEROBIC Blood Culture  adequate volume   Culture   Final    NO GROWTH < 12 HOURS Performed at [facility], [address] [place],  [address]    Report Status PENDING  Incomplete  Radiology Studies: DG Chest Portable 1 View  Result Date: 07/03/2020 CLINICAL DATA:  Shortness of breath EXAM: PORTABLE CHEST 1 VIEW COMPARISON:  06/26/2020 FINDINGS: Cardiac shadow is stable. Aortic calcifications are again seen. Lungs are well aerated bilaterally. Mild vascular congestion is again seen. Bibasilar atelectatic changes are noted. Artifact is noted overlying the anterior chest. No other focal abnormality is seen. IMPRESSION: Bibasilar atelectasis. Electronically Signed   By: Inez Catalina M.D.   On: 07/03/2020 20:44        Scheduled Meds: . allopurinol  200 mg Oral Daily  . aspirin EC  81 mg Oral Daily  . atorvastatin  40 mg Oral q1800  . azithromycin  500 mg Oral Daily  . budesonide (PULMICORT) nebulizer solution  0.5 mg Nebulization BID  . carvedilol  25 mg Oral BID WC  . fluticasone  2 spray Each Nare Daily  . furosemide  40 mg Intravenous Daily  . heparin injection (subcutaneous)  5,000 Units Subcutaneous Q8H  . insulin aspart  0-9 Units Subcutaneous TID WC  . ipratropium-albuterol  3 mL Nebulization Q6H  . isosorbide-hydrALAZINE  1 tablet Oral TID  . loratadine  10 mg Oral Daily  . montelukast  10 mg Oral QHS  . pantoprazole  40 mg Oral Q0600  . predniSONE  40 mg Oral Q breakfast  . sacubitril-valsartan  1 tablet Oral BID  . sodium chloride flush  3 mL Intravenous Q12H  . sodium chloride flush  3 mL Intravenous Q12H   Continuous Infusions: . sodium chloride    . sodium chloride       LOS: 0 days    Time spent: 40 minutes    Irine Seal, MD [facility]   To contact the attending provider between 7A-7P or the covering provider during after hours 7P-7A, please log into the web site [email or web address] and access using universal [place] password for that  web site. If you do not have the password, please call the hospital operator.  07/04/2020, 5:41 PM

## 2020-07-04 NOTE — ED Notes (Signed)
Patient was informed earlier that we were going to move him to CPOD to hold until an admit bed was available. Patient was now informed that another patient needed the CPOD bed, patient replied "a white person?" This Probation officer replied we aren't doing that and it wasn't a room on the floor, just in a different part of the hospital. Patient requested to be left alone and room darkened. Patient is on the phone.

## 2020-07-04 NOTE — ED Notes (Signed)
Per CT pt was not able to receive his CT scan due to a high creatinine level; 2.16

## 2020-07-04 NOTE — ED Notes (Signed)
PT finished sandwich tray and given ice cream as well. 3 CBG checks completed.Pt is resting now.

## 2020-07-04 NOTE — ED Notes (Signed)
Attempted to call resp x2 to place pt on CPAP. Resp busy at this time

## 2020-07-04 NOTE — ED Notes (Signed)
Patient given chocolate milk with a cup of ice per request.

## 2020-07-04 NOTE — ED Notes (Signed)
Patient is sitting on side of the bed, cardiac leads are on the floor again. Patient states they come off when he sleeps. Cardiac leads remain off at this time. Patient appears agitated and is complaining about the conditions in the hospital, but refuses to elaborate. Patient is sitting on the side of the stretcher using nebulizer. Patient asked for diet ginger ale and peanut butter. Patient request that his door remain open and lights on at this time. Aleen Sells, patient rep, informed of patient's concerns. Patient had previous similar concerns earlier and LuAnn spoke with him.

## 2020-07-04 NOTE — ED Notes (Signed)
Patient is active at bedside, standing up and down at bedside. Socks were placed on the patient. Patient requested and received two warm blankets.

## 2020-07-04 NOTE — ED Notes (Signed)
Patient called dietary to order a new lunch.

## 2020-07-04 NOTE — ED Notes (Signed)
Report given to [place] in [place].

## 2020-07-04 NOTE — ED Notes (Signed)
Pt given a diet ginger ale with ice, 3 peanut butter and 6 packs of saltines.

## 2020-07-04 NOTE — H&P (Addendum)
History and Physical    [name]:[id number] DOB: 10/08/1960 DOA: 07/03/2020  PCP: [name], MD  Patient coming from: home  I have personally briefly reviewed patient's old medical records in [place]  Chief Complaint: sob, chest tightness,cough   HPI: Chris [name] is a 60 y.o. male with medical history significant for diastolic and systolic heart failure, COPD on chronic oxygen, CKD 3, depression, hypertension, hyperlipidemia, gout, diabetes who presents with chest pain and worsening shortness of breat. Per chart in the [facility] sat was in the 70s and pt was switched over to CPAP. Patient had note has interim history of recent admission 06/16/2020-06/18/2020 at which time patient was diagnosed with Acute CHF exacerbation with acute hypoxic respiratory , patient was unfortunately at that time discharged AMA. He then presented to ED on 1/13 with sob/wheezing from jail he was found to have acute CHF he was treated in ed and lasix home dosage was increased and he was discharged to outpatient care with a course of steroids as well.   He now returns with similar complaint of acute onset of sob, wheezing, chest tightness x for less than one day.He also has associated complaints of cough, as well as tenderness of abdomen s/p coughing episodes. He notes no f/c/n/v/d/.   ED Course:  98.3, bp 134/88, hr 79, rr 19 , sat 98% on bipap  Wbc: 15.3,hgb 15.9, pmn 11.4, mag 2  Na 140, K 4.5, cl 101, glu 42, cr 2.16 at around base ce 31 [id number] less than prior 2000's UA :neg Vbg: 7.37/59 covid resp panel:neg  tx solumedrol, douneb Lasix 92m  lactic 2  procal 0.15 tx , ctx/ azithromycin  ekg nsr lvh, with ant/lateral nonspecific twave findings Review of Systems: As per HPI otherwise 10 point review of systems negative.   Past Medical History:  Diagnosis Date  . [place]   . Chronic kidney disease   . COPD (chronic obstructive pulmonary  disease) ([place]   . Coronary artery disease   . Depression   . Diabetes mellitus without complication ([place]   . GERD (gastroesophageal reflux disease)   . Gout   . Hypertension   . Influenza A with respiratory manifestations   . Mental disorder     Past Surgical History:  Procedure Laterality Date  . ANKLE SURGERY    . CARDIAC CATHETERIZATION    . HERNIA REPAIR     x2  . SHOULDER SURGERY       reports that he has been smoking cigarettes. He has a 20.00 pack-year smoking history. He has never used smokeless tobacco. He reports current drug use. Frequency: 21.00 times per week. Drugs: Marijuana and Cocaine. He reports that he does not drink alcohol.  No Known Allergies  Family History  Problem Relation Age of Onset  . Heart disease Father   . Diabetes Mother   . HIV Brother   . Healthy Son   . Healthy Daughter    Prior to Admission medications   Medication Sig Start Date End Date Taking? Authorizing Provider  albuterol (PROVENTIL) (2.5 MG/3ML) 0.083% nebulizer solution Take 3 mLs by nebulization every 6 (six) hours as needed for shortness of breath. 04/17/20   JLadell Pier MD  albuterol (VENTOLIN HFA) 108 (90 Base) MCG/ACT inhaler Inhale 2 puffs into the lungs every 6 (six) hours as needed for wheezing or shortness of breath. 04/17/20   JLadell Pier MD  allopurinol (ZYLOPRIM) 100 MG tablet Take  2 tablets (200 mg total) by mouth daily. 07/01/20   [name], MD  aspirin 81 MG EC tablet Take 1 tablet (81 mg total) by mouth daily. 07/01/20   [name], MD  atorvastatin (LIPITOR) 40 MG tablet Take 1 tablet (40 mg total) by mouth daily at 6 PM. 07/01/20   [name], MD  blood glucose meter kit and supplies Dispense based on patient and insurance preference. Use up to four times daily as directed. (FOR ICD-10 E10.9, E11.9). 04/17/20   [name], MD  Blood Glucose Monitoring Suppl (ACCU-CHEK GUIDE) w/Device KIT USE AS DIRECTED 04/17/20    [name], MD  Blood Glucose Monitoring Suppl (TRUE METRIX METER) w/Device KIT Use as directed 04/17/20   [name], MD  budesonide-formoterol [facility]) 160-4.5 MCG/ACT inhaler Inhale 2 puffs into the lungs 2 (two) times daily. 04/17/20   [name], MD  carvedilol (COREG) 25 MG tablet Take 1 tablet (25 mg total) by mouth 2 (two) times daily with a meal. 07/01/20   [name], MD  diclofenac Sodium (VOLTAREN) 1 % GEL Apply 2 g topically 4 (four) times daily. Use on elbow pain 05/05/20   [name], MD  fluticasone [facility]) 50 MCG/ACT nasal spray Place 1 spray into both nostrils daily as needed for allergies or rhinitis. 05/06/20   Swords, Darrick Penna, MD  furosemide (LASIX) 40 MG tablet Take 1.5 tablets (60 mg total) by mouth daily. 07/01/20   [name], MD  glipiZIDE (GLUCOTROL) 5 MG tablet Take 0.5 tablets (2.5 mg total) by mouth 2 (two) times daily before a meal. 07/01/20   [name], MD  glucose blood (ACCU-CHEK GUIDE) test strip Use as instructed. Check blood glucose by fingerstick once per day. 03/06/20   [name], MD  glucose blood (TRUE METRIX BLOOD GLUCOSE TEST) test strip Use as instructed 12/28/19   [name], MD  isosorbide-hydrALAZINE (BIDIL) 20-37.5 MG tablet Take 1 tablet by mouth 3 (three) times daily. 07/01/20   [name], MD  Lancet Devices (LANCING DEVICE) MISC 1 Device by Does not apply route daily. 12/27/19   Regalado, Belkys A, MD  montelukast (SINGULAIR) 10 MG tablet Take 1 tablet (10 mg total) by mouth at bedtime. 07/01/20 09/29/20  [name], MD  predniSONE (DELTASONE) 20 MG tablet Take 2 tabs PO daily x 2 days, then 1.5 tab daily x 2 days then 1 tab x2 days then 1/2 tab x 2 days 07/01/20   [name], MD  sacubitril-valsartan (ENTRESTO) 24-26 MG Take 1 tablet by mouth 2 (two) times daily. 07/01/20   [name], MD  TRUEplus Lancets 28G MISC Use as directed 12/28/19   [name], MD    Physical Exam: Vitals:   07/03/20 2029 07/03/20 2031 07/03/20 2230 07/04/20 0100  BP: 134/88  (!) 150/136 (!) 148/128  Pulse: 79  76 (!) 138  Resp: 19  (!) 39   Temp: 98.3 F (36.8 C)     TempSrc: Axillary     SpO2: 98%  94% (!) 85%  Weight:  122.9 kg    Height:  6' (1.829 m)      Constitutional: NAD, calm, comfortable Vitals:   07/03/20 2029 07/03/20 2031 07/03/20 2230 07/04/20 0100  BP: 134/88  (!) 150/136 (!) 148/128  Pulse: 79  76 (!) 138  Resp: 19  (!) 39   Temp: 98.3 F (36.8 C)  TempSrc: Axillary     SpO2: 98%  94% (!) 85%  Weight:  122.9 kg    Height:  6' (1.829 m)     Eyes: PERRL, lids and conjunctivae normal ENMT: Mucous membranes are moist. Posterior pharynx clear of any exudate or lesions.Normal dentition.  Neck: normal, supple, no masses, no thyromegaly Respiratory: coarse diminished, no wheezing, no crackles. Normal respiratory effort. No accessory muscle use.  Cardiovascular: Regular rate and rhythm, no murmurs / rubs / gallops. No extremity edema. 2+ pedal pulses . Abdomen: mild tenderness diffuse, no masses palpated. No hepatosplenomegaly. Bowel sounds positive.  Musculoskeletal: no clubbing / cyanosis. No joint deformity upper and lower extremities. Good ROM, no contractures. Normal muscle tone.  Skin: no rashes, lesions, ulcers. No induration Neurologic: CN 2-12 grossly intact. Sensation intact, Strength 5/5 in all 4.  Psychiatric: Normal judgment and insight. Alert and oriented x 3. Normal mood.    Labs on Admission: I have personally reviewed following labs and imaging studies  CBC: Recent Labs  Lab 07/03/20 2031  WBC 15.3*  NEUTROABS 11.4*  HGB 15.9  HCT 49.8  MCV 92.7  PLT 761   Basic Metabolic Panel: Recent Labs  Lab 07/03/20 2031  NA 140  K 4.5  CL 101  CO2 28  GLUCOSE 42*  BUN 25*  CREATININE 2.16*  CALCIUM 8.8*  MG 2.0   GFR: Estimated Creatinine Clearance: 51.1 mL/min (A) (by C-G formula based on SCr of 2.16  mg/dL (H)). Liver Function Tests: Recent Labs  Lab 07/03/20 2031  AST 15  ALT 20  ALKPHOS 77  BILITOT 0.7  PROT 7.1  ALBUMIN 3.4*   Recent Labs  Lab 07/03/20 2031  LIPASE 19   No results for input(s): AMMONIA in the last 168 hours. Coagulation Profile: No results for input(s): INR, PROTIME in the last 168 hours. Cardiac Enzymes: No results for input(s): CKTOTAL, CKMB, CKMBINDEX, TROPONINI in the last 168 hours. BNP (last 3 results) No results for input(s): PROBNP in the last 8760 hours. HbA1C: No results for input(s): HGBA1C in the last 72 hours. CBG: Recent Labs  Lab 07/03/20 2150 07/03/20 2314 07/04/20 0044  GLUCAP 109* 136* 168*   Lipid Profile: No results for input(s): CHOL, HDL, LDLCALC, TRIG, CHOLHDL, LDLDIRECT in the last 72 hours. Thyroid Function Tests: No results for input(s): TSH, T4TOTAL, FREET4, T3FREE, THYROIDAB in the last 72 hours. Anemia Panel: No results for input(s): VITAMINB12, FOLATE, FERRITIN, TIBC, IRON, RETICCTPCT in the last 72 hours. Urine analysis:    Component Value Date/Time   COLORURINE YELLOW (A) 07/03/2020 2032   APPEARANCEUR CLEAR (A) 07/03/2020 2032   APPEARANCEUR Clear 11/30/2018 1035   LABSPEC 1.011 07/03/2020 2032   PHURINE 6.0 07/03/2020 2032   GLUCOSEU NEGATIVE 07/03/2020 2032   HGBUR SMALL (A) 07/03/2020 2032   BILIRUBINUR NEGATIVE 07/03/2020 2032   BILIRUBINUR Negative 11/30/2018 [place] 07/03/2020 2032   PROTEINUR 100 (A) 07/03/2020 2032   NITRITE NEGATIVE 07/03/2020 2032   LEUKOCYTESUR NEGATIVE 07/03/2020 2032    Radiological Exams on Admission: DG Chest Portable 1 View  Result Date: 07/03/2020 CLINICAL DATA:  Shortness of breath EXAM: PORTABLE CHEST 1 VIEW COMPARISON:  06/26/2020 FINDINGS: Cardiac shadow is stable. Aortic calcifications are again seen. Lungs are well aerated bilaterally. Mild vascular congestion is again seen. Bibasilar atelectatic changes are noted. Artifact is noted overlying  the anterior chest. No other focal abnormality is seen. IMPRESSION: Bibasilar atelectasis. Electronically Signed   By: Linus Mako.D.  On: 07/03/2020 20:44    EKG: Independently reviewed. See above   Assessment/Plan   Acute on chronic diastolic and systolic heart failure exacerbation with associated acute hypoxemia -place on chf protocol  -lasix 40 mg iv daily, increase diuresis base on symptoms  -wean O2 baseline of 2L [place] as able  Abn CE -chronic leak, note lower than prior -ekg has twave changes , patient with chest pain  -last echo ef 20 to 25% with global hypokinesis  - continue to cycle to be complete  AE COPD  -s/p solumedrol in ed  -no current wheezing on exam  -prednisone 40 mg po x 5 days  -azithromycin x5 days -procal 0.15 lower than prior baseline  -wean O2 to 2L chronic oxygen,   Leukocytosis -in setting of lactic acidosis -increase wbc in setting of recent steroid use  - ua noted to be negative , patient is afebrile  -procal lower that prior  - will f/u with dedicated ct chest to r/o occult pna  -for now will continue with azithromycin, adjust abx based cultures/imaging results/clinical picture  -monitor lactate   CKDIII -stable at baseline  -monitor on diuresis   Depression -resume psych medications   Hypertension -uncontrolled inititially  -resume home regimen as able     Hyperlipidemia     Resume home medications   Gout -patient note feels like he is getting a flare - on steroids for COPD -monitor symptoms    DMII -place on inpatient protocol  -is,fs   DVT prophylaxis: lovenox  Code Status:full Family Communication: none at bedside Disposition Plan: patient  expected to be admitted greater than 2 midnights Consults called:consider cardiology in am  Admission status:inpatient    Clance Boll MD [facility]  If 7PM-7AM, please contact night-coverage [email or web address] Password [facility]  07/04/2020, 1:36 AM

## 2020-07-04 NOTE — ED Notes (Signed)
Patient is eating at this time. Will give neb treatment when he is done.

## 2020-07-04 NOTE — ED Notes (Signed)
Pt given crackers and peanut butter and sprite

## 2020-07-04 NOTE — Progress Notes (Signed)
PHARMACIST - PHYSICIAN COMMUNICATION  CONCERNING:  Enoxaparin (Lovenox) for DVT Prophylaxis    RECOMMENDATION: Patient was prescribed enoxaprin 40mg  q24 hours for VTE prophylaxis.   Filed Weights   07/03/20 2031  Weight: 122.9 kg (271 lb)   Body mass index is 36.75 kg/m.  Estimated Creatinine Clearance: 51.1 mL/min (A) (by C-G formula based on SCr of 2.16 mg/dL (H)).  Based on [place] patient is candidate for enoxaparin 0.5mg /kg TBW SQ every 24 hours based on BMI being >30.  DESCRIPTION: Pharmacy has adjusted enoxaparin dose per [facility] policy.  Patient is now receiving enoxaparin 60 mg every 24 hours    Ena Dawley, PharmD Clinical Pharmacist  07/04/2020 1:09 AM

## 2020-07-04 NOTE — ED Notes (Signed)
Cardiology at bedside.

## 2020-07-05 DIAGNOSIS — J9601 Acute respiratory failure with hypoxia: Secondary | ICD-10-CM | POA: Diagnosis present

## 2020-07-05 LAB — CBC WITH DIFFERENTIAL/PLATELET
Abs Immature Granulocytes: 0.07 10*3/uL (ref 0.00–0.07)
Basophils Absolute: 0 10*3/uL (ref 0.0–0.1)
Basophils Relative: 0 %
Eosinophils Absolute: 0 10*3/uL (ref 0.0–0.5)
Eosinophils Relative: 0 %
HCT: 45.9 % (ref 39.0–52.0)
Hemoglobin: 14.4 g/dL (ref 13.0–17.0)
Immature Granulocytes: 0 %
Lymphocytes Relative: 10 %
Lymphs Abs: 1.6 10*3/uL (ref 0.7–4.0)
MCH: 29.6 pg (ref 26.0–34.0)
MCHC: 31.4 g/dL (ref 30.0–36.0)
MCV: 94.4 fL (ref 80.0–100.0)
Monocytes Absolute: 0.9 10*3/uL (ref 0.1–1.0)
Monocytes Relative: 6 %
Neutro Abs: 13.8 10*3/uL — ABNORMAL HIGH (ref 1.7–7.7)
Neutrophils Relative %: 84 %
Platelets: 150 10*3/uL (ref 150–400)
RBC: 4.86 MIL/uL (ref 4.22–5.81)
RDW: 14.3 % (ref 11.5–15.5)
WBC: 16.4 10*3/uL — ABNORMAL HIGH (ref 4.0–10.5)
nRBC: 0 % (ref 0.0–0.2)

## 2020-07-05 LAB — BASIC METABOLIC PANEL
Anion gap: 14 (ref 5–15)
BUN: 40 mg/dL — ABNORMAL HIGH (ref 6–20)
CO2: 25 mmol/L (ref 22–32)
Calcium: 8.6 mg/dL — ABNORMAL LOW (ref 8.9–10.3)
Chloride: 99 mmol/L (ref 98–111)
Creatinine, Ser: 1.75 mg/dL — ABNORMAL HIGH (ref 0.61–1.24)
GFR, Estimated: 45 mL/min — ABNORMAL LOW (ref >60)
Glucose, Bld: 163 mg/dL — ABNORMAL HIGH (ref 70–99)
Potassium: 4.6 mmol/L (ref 3.5–5.1)
Sodium: 138 mmol/L (ref 135–145)

## 2020-07-05 LAB — MAGNESIUM: Magnesium: 2.1 mg/dL (ref 1.7–2.4)

## 2020-07-05 LAB — HEMOGLOBIN A1C
Hgb A1c MFr Bld: 6.4 % — ABNORMAL HIGH (ref 4.8–5.6)
Mean Plasma Glucose: 136.98 mg/dL

## 2020-07-05 LAB — PROCALCITONIN: Procalcitonin: 0.16 ng/mL

## 2020-07-05 LAB — CBG MONITORING, ED
Glucose-Capillary: 99 mg/dL (ref 70–99)
Glucose-Capillary: 123 mg/dL — ABNORMAL HIGH (ref 70–99)
Glucose-Capillary: 161 mg/dL — ABNORMAL HIGH (ref 70–99)

## 2020-07-05 LAB — GLUCOSE, CAPILLARY: Glucose-Capillary: 152 mg/dL — ABNORMAL HIGH (ref 70–99)

## 2020-07-05 MED ORDER — LEVALBUTEROL HCL 0.63 MG/3ML IN NEBU
0.6300 mg | INHALATION_SOLUTION | Freq: Four times a day (QID) | RESPIRATORY_TRACT | Status: DC
  Administered 2020-07-05 – 2020-07-06 (×3): 0.63 mg via RESPIRATORY_TRACT
  Filled 2020-07-05 (×6): qty 3

## 2020-07-05 MED ORDER — METOPROLOL TARTRATE 5 MG/5ML IV SOLN: 5.0000 mg | Freq: Once | INTRAVENOUS | Status: DC

## 2020-07-05 MED ORDER — IPRATROPIUM BROMIDE 0.02 % IN SOLN
0.5000 mg | Freq: Four times a day (QID) | RESPIRATORY_TRACT | Status: DC
  Administered 2020-07-05 – 2020-07-06 (×3): 0.5 mg via RESPIRATORY_TRACT
  Filled 2020-07-05 (×4): qty 2.5

## 2020-07-05 NOTE — ED Notes (Signed)
Pt found ambulating in hall by RN looking for ice chips- was redirected to room and informed to wait there.

## 2020-07-05 NOTE — ED Notes (Signed)
Pt reports non-productive cough, denies pain. Pt AOX4, NAD noted. Pt provided numerous snacks per his request.

## 2020-07-05 NOTE — ED Notes (Signed)
Pt washing clothes in the sink. Hygiene products given to pt per his request.

## 2020-07-05 NOTE — ED Notes (Signed)
Pt given ice chips and mobile phone to speak with sister.

## 2020-07-05 NOTE — Progress Notes (Signed)
MD notified for HR on arrival to floor from ED.  Patient denies chest pain and doesn't appear in any distress.  Patient is alert and oriented, ambulated to bed and requesting food.  Patient oriented to room and call bell.

## 2020-07-05 NOTE — ED Notes (Signed)
Pt called this RN to the room. Pt states that who ever threw away his pad on the bed took his money. This RN stated that I was the one that threw away the incontinence pad because it was crumbled on his bed and I did not see any money. Pt states, "somebody took my stuff and I need them to find it. You don't need to touch any body's things." This RN and Dena, RN helped pt looked through the trash can and linen bag pt states that it was under the pad. States that he did not want [place] touching this things. Called Charge RN, Anderson Malta to help mediate   Pt found his money. Agricultural consultant, Engineer, civil (consulting). Offered pt to place money in the safe. Pt refused. Anderson Malta, RN explained to the pt that if they refused we would not be responsible for any lost of misplaced property.

## 2020-07-05 NOTE — Progress Notes (Signed)
PROGRESS NOTE    Chris [name]  WCH:[id number] DOB: Jul 03, 1960 DOA: 07/03/2020 PCP: [name], MD    Chief Complaint  Patient presents with  . Shortness of Breath    Brief Narrative:  HPI per Dr. Ninetta Lights is a 60 y.o. male with medical history significant for diastolic and systolic heart failure, COPD on chronic oxygen, CKD 3, depression, hypertension, hyperlipidemia, gout, diabetes who presents with chest pain and worsening shortness of breat. Per chart in the [facility] sat was in the 70s and pt was switched over to CPAP. Patient had note has interim history of recent admission 06/16/2020-06/18/2020 at which time patient was diagnosed with Acute CHF exacerbation with acute hypoxic respiratory , patient was unfortunately at that time discharged AMA. He then presented to ED on 1/13 with sob/wheezing from jail he was found to have acute CHF he was treated in ed and lasix home dosage was increased and he was discharged to outpatient care with a course of steroids as well.   He now returns with similar complaint of acute onset of sob, wheezing, chest tightness x for less than one day.He also has associated complaints of cough, as well as tenderness of abdomen s/p coughing episodes. He notes no f/c/n/v/d/.   ED Course:  98.3, bp 134/88, hr 79, rr 19 , sat 98% on bipap  Wbc: 15.3,hgb 15.9, pmn 11.4, mag 2  Na 140, K 4.5, cl 101, glu 42, cr 2.16 at around base ce 31 [id number] less than prior 2000's UA :neg Vbg: 7.37/59 covid resp panel:neg  tx solumedrol, douneb Lasix 40mg   lactic 2  procal 0.15 tx , ctx/ azithromycin  ekg nsr lvh, with ant/lateral nonspecific twave findings  Assessment & Plan:   Principal Problem:   Acute and chronic respiratory failure (acute-on-chronic) (HCC) Active Problems:   Chronic systolic heart failure (HCC)   Mixed hyperlipidemia   Essential hypertension   CKD (chronic kidney disease) stage 3, GFR 30-59 ml/min (HCC)   Tobacco  dependence   History of gout   Acute on chronic combined systolic and diastolic CHF (congestive heart failure) (HCC)   Type 2 diabetes mellitus with stage 3 chronic kidney disease (HCC)   COPD with acute exacerbation (HCC)  1 acute respiratory failure with hypoxia secondary to acute on chronic combined systolic and diastolic heart failure exacerbation and possible acute COPD exacerbation Patient presented with worsening shortness of breath, noted to be hypoxic with sats in the 70s, complaining of chest tightness, noted to have crackles on examination.   Patient with prior 2D echo with EF of 20 to 25% with global hypokinesis.  Cardiac enzymes elevated but flattened.  2D echo not repeated as one recently done.   Patient on Lasix 40 mg IV every 12 hours with a urine output of 5.2 L over the past 24 hours. Weights not recorded. Patient with clinical improvement. Patient was on BiPAP on admission currently off BiPAP and on room air. Continue current dose of IV Lasix 40 mg every 12 hours. Continue cardiac medications of Coreg, BiDil, Entresto, aspirin, statin. Continue bronchodilators, steroids, azithromycin, CPAP nightly. Cardiology following recommending continued current medical management with close outpatient follow-up with cardiology.  2.  Mild acute exacerbation of COPD Patient presenting with shortness of breath with chest tightness, some crackles, concern for possible COPD exacerbation.  Patient with clinical improvement with chest tightness. Patient also on IV diuretics. Patient given a dose of IV Solu-Medrol currently on prednisone 40 mg daily which we  will continue. Continue scheduled DuoNebs, Claritin, PPI, Flonase, Pulmicort nebs, azithromycin. Incentive spirometry. Flutter valve. Tobacco cessation stressed to patient. Outpatient follow-up.  3.  Elevated lactic acid level/leukocytosis Questionable etiology.  Patient currently afebrile.  Urinalysis unremarkable.  Blood cultures ordered and  pending with no growth to date.  On empiric azithromycin.   4.  Chronic kidney disease stage IIIb Baseline creatinine approximately 1.9-2. Creatinine currently 1.75 with diuresis. Follow.   5.  Hypertension Improved on home regimen of Coreg, BiDil, Entresto. Also on IV Lasix.  6.  Hyperlipidemia Continue statin.  7.  OSA CPAP nightly  8.  GERD Continue PPI.  9.  History of gout Continue allopurinol.  10.  Polysubstance abuse Cessation stressed to patient.  TOC consult.    DVT prophylaxis: Heparin Code Status: Full Family Communication: Updated patient.  No family at bedside. Disposition:   Status is: Inpatient    Dispo: The patient is from: Home              Anticipated d/c is to: Home              Anticipated d/c date is: 2-3 days              Patient currently on IV Lasix, being diuresed, not stable for discharge.       Consultants:   Cardiology: Dr Nehemiah Massed 07/04/2020  Procedures:   Chest x-ray 07/03/2020  Antimicrobials:   Oral azithromycin 07/04/2020>>>  IV azithromycin 1/20 /2022x1 dose  IV Rocephin 1/20/ 2022x1 dose   Subjective: Feeling better. Feels SOB improving. Sitting up on the side of the bed. States some improvement with chest tightness. No abdominal pain.  Objective: Vitals:   07/05/20 0535 07/05/20 0544 07/05/20 0610 07/05/20 1037  BP:    135/78  Pulse: 63 61 65 85  Resp:    (!) 26  Temp:      TempSrc:      SpO2: 93% 91% 96% 94%  Weight:      Height:        Intake/Output Summary (Last 24 hours) at 07/05/2020 1112 Last data filed at 07/05/2020 0256 Gross per 24 hour  Intake --  Output 3725 ml  Net -3725 ml   Filed Weights   07/03/20 2031  Weight: 122.9 kg    Examination:  General exam: Appears calm and comfortable  Respiratory system: Bibasilar crackles. Minimal Expiratory wheezing improving. Fair air movement. Speaking in full sentences Cardiovascular system: S1 & S2 heard, RRR. No JVD, murmurs, rubs, gallops or  clicks. No pedal edema. Gastrointestinal system: Abdomen is soft, nondistended, positive bowel sounds, nontender to palpation. No rebound. No guarding.  Central nervous system: Alert and oriented. No focal neurological deficits. Extremities: Symmetric 5 x 5 power. Skin: No rashes, lesions or ulcers Psychiatry: Judgement and insight appear normal. Mood & affect appropriate.     Data Reviewed: I have personally reviewed following labs and imaging studies  CBC: Recent Labs  Lab 07/03/20 2031 07/04/20 0246 07/05/20 0359  WBC 15.3* 13.4* 16.4*  NEUTROABS 11.4*  --  13.8*  HGB 15.9 14.9 14.4  HCT 49.8 45.3 45.9  MCV 92.7 91.7 94.4  PLT 234 217 [id number]    Basic Metabolic Panel: Recent Labs  Lab 07/03/20 2031 07/04/20 0246 07/05/20 0359  NA 140  --  138  K 4.5  --  4.6  CL 101  --  99  CO2 28  --  25  GLUCOSE 42*  --  163*  BUN 25*  --  40*  CREATININE 2.16* 2.00* 1.75*  CALCIUM 8.8*  --  8.6*  MG 2.0  --  2.1    GFR: Estimated Creatinine Clearance: 63 mL/min (A) (by C-G formula based on SCr of 1.75 mg/dL (H)).  Liver Function Tests: Recent Labs  Lab 07/03/20 2031  AST 15  ALT 20  ALKPHOS 77  BILITOT 0.7  PROT 7.1  ALBUMIN 3.4*    CBG: Recent Labs  Lab 07/03/20 2314 07/04/20 0044 07/04/20 1310 07/04/20 1644 07/05/20 0757  GLUCAP 136* 168* 153* 141* 99     Recent Results (from the past 240 hour(s))  SARS Coronavirus 2 by RT PCR (hospital order, performed in [facility] hospital lab) Nasopharyngeal Nasopharyngeal Swab     Status: None   Collection Time: 07/03/20  8:34 PM   Specimen: Nasopharyngeal Swab  Result Value Ref Range Status   SARS Coronavirus 2 NEGATIVE NEGATIVE Final    Comment: (NOTE) SARS-CoV-2 target nucleic acids are NOT DETECTED.  The SARS-CoV-2 RNA is generally detectable in upper and lower respiratory specimens during the acute phase of infection. The lowest concentration of SARS-CoV-2 viral copies this assay can detect is 250 copies  / mL. A negative result does not preclude SARS-CoV-2 infection and should not be used as the sole basis for treatment or other patient management decisions.  A negative result may occur with improper specimen collection / handling, submission of specimen other than nasopharyngeal swab, presence of viral mutation(s) within the areas targeted by this assay, and inadequate number of viral copies (<250 copies / mL). A negative result must be combined with clinical observations, patient history, and epidemiological information.  Fact Sheet for Patients:   StrictlyIdeas.no  Fact Sheet for Healthcare Providers: BankingDealers.co.za  This test is not yet approved or  cleared by the [place] FDA and has been authorized for detection and/or diagnosis of SARS-CoV-2 by FDA under an Emergency Use Authorization (EUA).  This EUA will remain in effect (meaning this test can be used) for the duration of the COVID-19 declaration under Section 564(b)(1) of the Act, 21 U.S.C. section 360bbb-3(b)(1), unless the authorization is terminated or revoked sooner.  Performed at [facility], [address] [place], [place] [address]   Culture, blood (routine x 2)     Status: None (Preliminary result)   Collection Time: 07/03/20 10:45 PM   Specimen: BLOOD  Result Value Ref Range Status   Specimen Description BLOOD RIGHT ASSIST CONTROL  Final   Special Requests   Final    BOTTLES DRAWN AEROBIC AND ANAEROBIC Blood Culture adequate volume   Culture   Final    NO GROWTH 2 DAYS Performed at [facility], [address], [place], [place] [address]    Report Status PENDING  Incomplete  Culture, blood (routine x 2)     Status: None (Preliminary result)   Collection Time: 07/03/20 10:45 PM   Specimen: BLOOD  Result Value Ref Range Status   Specimen Description BLOOD LEFT HAND  Final   Special Requests   Final    BOTTLES DRAWN AEROBIC AND  ANAEROBIC Blood Culture adequate volume   Culture   Final    NO GROWTH 2 DAYS Performed at [facility], [address] [place],  [address]    Report Status PENDING  Incomplete         Radiology Studies: DG Chest Portable 1 View  Result Date: 07/03/2020 CLINICAL DATA:  Shortness of breath EXAM: PORTABLE CHEST 1 VIEW COMPARISON:  06/26/2020  FINDINGS: Cardiac shadow is stable. Aortic calcifications are again seen. Lungs are well aerated bilaterally. Mild vascular congestion is again seen. Bibasilar atelectatic changes are noted. Artifact is noted overlying the anterior chest. No other focal abnormality is seen. IMPRESSION: Bibasilar atelectasis. Electronically Signed   By: Inez Catalina M.D.   On: 07/03/2020 20:44        Scheduled Meds: . allopurinol  200 mg Oral Daily  . aspirin EC  81 mg Oral Daily  . atorvastatin  40 mg Oral q1800  . azithromycin  500 mg Oral Daily  . budesonide (PULMICORT) nebulizer solution  0.5 mg Nebulization BID  . carvedilol  25 mg Oral BID WC  . fluticasone  2 spray Each Nare Daily  . furosemide  40 mg Intravenous Q12H  . heparin injection (subcutaneous)  5,000 Units Subcutaneous Q8H  . insulin aspart  0-9 Units Subcutaneous TID WC  . ipratropium-albuterol  3 mL Nebulization Q6H  . isosorbide-hydrALAZINE  1 tablet Oral TID  . loratadine  10 mg Oral Daily  . montelukast  10 mg Oral QHS  . pantoprazole  40 mg Oral Q0600  . predniSONE  40 mg Oral Q breakfast  . sacubitril-valsartan  1 tablet Oral BID  . sodium chloride flush  3 mL Intravenous Q12H  . sodium chloride flush  3 mL Intravenous Q12H   Continuous Infusions: . sodium chloride    . sodium chloride       LOS: 1 day    Time spent: 40 minutes    Irine Seal, MD [facility]   To contact the attending provider between 7A-7P or the covering provider during after hours 7P-7A, please log into the web site [email or web address] and access using universal [place]  password for that web site. If you do not have the password, please call the hospital operator.  07/05/2020, 11:12 AM

## 2020-07-05 NOTE — ED Notes (Signed)
Pt ambulated to the restroom without assistance

## 2020-07-05 NOTE — ED Notes (Signed)
Messaged Jeralyn Ruths, RN with [facility].

## 2020-07-05 NOTE — Progress Notes (Signed)
SUBJECTIVE: Patient overall is done much better since the last evening with improvements of cough congestion with less abdominal distention and lower extremity edema.  The patient did have some wheezes but this was from his COPD and no current evidence of rails.  He does have continued LV systolic dysfunction but improved urine output and no current evidence of myocardial infarction or acute coronary syndrome  Vitals:   07/05/20 0535 07/05/20 0544 07/05/20 0610 07/05/20 1037  BP:    135/78  Pulse: 63 61 65 85  Resp:    (!) 26  Temp:      TempSrc:      SpO2: 93% 91% 96% 94%  Weight:      Height:        Intake/Output Summary (Last 24 hours) at 07/05/2020 1124 Last data filed at 07/05/2020 0256 Gross per 24 hour  Intake -  Output 3725 ml  Net -3725 ml    LABS: Basic Metabolic Panel: Recent Labs    07/03/20 2031 07/04/20 0246 07/05/20 0359  NA 140  --  138  K 4.5  --  4.6  CL 101  --  99  CO2 28  --  25  GLUCOSE 42*  --  163*  BUN 25*  --  40*  CREATININE 2.16* 2.00* 1.75*  CALCIUM 8.8*  --  8.6*  MG 2.0  --  2.1   Liver Function Tests: Recent Labs    07/03/20 2031  AST 15  ALT 20  ALKPHOS 77  BILITOT 0.7  PROT 7.1  ALBUMIN 3.4*   Recent Labs    07/03/20 2031  LIPASE 19   CBC: Recent Labs    07/03/20 2031 07/04/20 0246 07/05/20 0359  WBC 15.3* 13.4* 16.4*  NEUTROABS 11.4*  --  13.8*  HGB 15.9 14.9 14.4  HCT 49.8 45.3 45.9  MCV 92.7 91.7 94.4  PLT 234 217 150   Cardiac Enzymes: No results for input(s): CKTOTAL, CKMB, CKMBINDEX, TROPONINI in the last 72 hours. BNP: Invalid input(s): POCBNP D-Dimer: No results for input(s): DDIMER in the last 72 hours. Hemoglobin A1C: No results for input(s): HGBA1C in the last 72 hours. Fasting Lipid Panel: No results for input(s): CHOL, HDL, LDLCALC, TRIG, CHOLHDL, LDLDIRECT in the last 72 hours. Thyroid Function Tests: No results for input(s): TSH, T4TOTAL, T3FREE, THYROIDAB in the last 72 hours.  Invalid  input(s): FREET3 Anemia Panel: No results for input(s): VITAMINB12, FOLATE, FERRITIN, TIBC, IRON, RETICCTPCT in the last 72 hours.   PHYSICAL EXAM General: Well developed, well nourished, in no acute distress HEENT:  Normocephalic and atramatic Neck:  No JVD.  Lungs: Clear bilaterally to auscultation and percussion. Heart: HRRR . Normal S1 and S2 without gallops or murmurs.  Abdomen: Bowel sounds are positive, abdomen soft and non-tender  Msk:  Back normal, normal gait. Normal strength and tone for age. Extremities: No clubbing, cyanosis or edema.   Neuro: Alert and oriented X 3. Psych:  Good affect, responds appropriately  TELEMETRY: Reviewed telemetry pt in normal sinus rhythm:  ASSESSMENT AND PLAN:  Principal Problem:   Acute and chronic respiratory failure (acute-on-chronic) (HCC) Active Problems:   Chronic systolic heart failure (HCC)   Mixed hyperlipidemia   Essential hypertension   CKD (chronic kidney disease) stage 3, GFR 30-59 ml/min (HCC)   Tobacco dependence   History of gout   Acute on chronic combined systolic and diastolic CHF (congestive heart failure) (HCC)   Type 2 diabetes mellitus with stage 3 chronic kidney disease (  [place])   COPD with acute exacerbation ([place])   Plan 1.  Change intravenous Lasix to oral Lasix 40 mg each day for further treatment of acute on chronic systolic dysfunction congestive heart failure with adjustments as an outpatient 2.  Continue Entresto beta-blocker and isosorbide hydralazine for cardiomyopathy as before  3 no further cardiac diagnostics necessary at this time 4.  Ambulate and follow for improvement of symptoms and ambulating well with no further significant cardiovascular concerns okay for discharge to home with follow-up next week  .  Corey Skains, MD, [facility] 07/05/2020 11:24 AM

## 2020-07-05 NOTE — Progress Notes (Signed)
Attempted to place patient on CPAP. Patient refused at this time due to late hour. Apologized to patient. RT has been tied up with multiple emergencies and transports this evening. Will follow up at bedtime this evening.

## 2020-07-05 NOTE — ED Notes (Addendum)
Report received by Rosaria Ferries, RN- transportation requested.

## 2020-07-05 NOTE — ED Notes (Signed)
This RN at pyxis pulling meds for another pt when this pt came out of room pointing at this RN and stating "I don't trust her, don't let her touch my things".  This RN did not engage with pt.  Lexie RN aware.

## 2020-07-05 NOTE — ED Notes (Signed)
Pt has been eating and drinking for the duration of the night. Attempted to keep leads on pt but they would not stay on. Pt states it was due to his sleep.

## 2020-07-06 DIAGNOSIS — I4892 Unspecified atrial flutter: Secondary | ICD-10-CM

## 2020-07-06 DIAGNOSIS — R000 Tachycardia, unspecified: Secondary | ICD-10-CM

## 2020-07-06 DIAGNOSIS — I483 Typical atrial flutter: Secondary | ICD-10-CM

## 2020-07-06 LAB — BASIC METABOLIC PANEL
Anion gap: 9 (ref 5–15)
BUN: 43 mg/dL — ABNORMAL HIGH (ref 6–20)
CO2: 25 mmol/L (ref 22–32)
Calcium: 8.4 mg/dL — ABNORMAL LOW (ref 8.9–10.3)
Chloride: 102 mmol/L (ref 98–111)
Creatinine, Ser: 1.83 mg/dL — ABNORMAL HIGH (ref 0.61–1.24)
GFR, Estimated: 43 mL/min — ABNORMAL LOW (ref >60)
Glucose, Bld: 158 mg/dL — ABNORMAL HIGH (ref 70–99)
Potassium: 4 mmol/L (ref 3.5–5.1)
Sodium: 136 mmol/L (ref 135–145)

## 2020-07-06 LAB — CBC WITH DIFFERENTIAL/PLATELET
Abs Immature Granulocytes: 0.15 10*3/uL — ABNORMAL HIGH (ref 0.00–0.07)
Basophils Absolute: 0.1 10*3/uL (ref 0.0–0.1)
Basophils Relative: 0 %
Eosinophils Absolute: 0.2 10*3/uL (ref 0.0–0.5)
Eosinophils Relative: 1 %
HCT: 49 % (ref 39.0–52.0)
Hemoglobin: 15.7 g/dL (ref 13.0–17.0)
Immature Granulocytes: 1 %
Lymphocytes Relative: 24 %
Lymphs Abs: 3.6 10*3/uL (ref 0.7–4.0)
MCH: 29.4 pg (ref 26.0–34.0)
MCHC: 32 g/dL (ref 30.0–36.0)
MCV: 91.8 fL (ref 80.0–100.0)
Monocytes Absolute: 1 10*3/uL (ref 0.1–1.0)
Monocytes Relative: 7 %
Neutro Abs: 10.2 10*3/uL — ABNORMAL HIGH (ref 1.7–7.7)
Neutrophils Relative %: 67 %
Platelets: 256 10*3/uL (ref 150–400)
RBC: 5.34 MIL/uL (ref 4.22–5.81)
RDW: 14.3 % (ref 11.5–15.5)
WBC: 15.1 10*3/uL — ABNORMAL HIGH (ref 4.0–10.5)
nRBC: 0 % (ref 0.0–0.2)

## 2020-07-06 LAB — GLUCOSE, CAPILLARY
Glucose-Capillary: 140 mg/dL — ABNORMAL HIGH (ref 70–99)
Glucose-Capillary: 128 mg/dL — ABNORMAL HIGH (ref 70–99)

## 2020-07-06 LAB — HEPARIN LEVEL (UNFRACTIONATED)
Heparin Unfractionated: 0.35 IU/mL (ref 0.30–0.70)
Heparin Unfractionated: 0.27 IU/mL — ABNORMAL LOW (ref 0.30–0.70)

## 2020-07-06 LAB — MRSA PCR SCREENING: MRSA by PCR: NEGATIVE

## 2020-07-06 LAB — MAGNESIUM: Magnesium: 2 mg/dL (ref 1.7–2.4)

## 2020-07-06 MED ORDER — HEPARIN BOLUS VIA INFUSION
4000.0000 [IU] | Freq: Once | INTRAVENOUS | Status: AC
  Administered 2020-07-06: 4000 [IU] via INTRAVENOUS
  Filled 2020-07-06: qty 4000

## 2020-07-06 MED ORDER — POLYETHYLENE GLYCOL 3350 17 G PO PACK
17.0000 g | PACK | Freq: Every day | ORAL | Status: DC
  Administered 2020-07-06: 17 g via ORAL
  Filled 2020-07-06: qty 1

## 2020-07-06 MED ORDER — SORBITOL 70 % SOLN
30.0000 mL | Status: AC
  Administered 2020-07-06: 30 mL via ORAL
  Filled 2020-07-06 (×2): qty 30

## 2020-07-06 MED ORDER — IPRATROPIUM BROMIDE 0.02 % IN SOLN
0.5000 mg | Freq: Three times a day (TID) | RESPIRATORY_TRACT | Status: DC
  Administered 2020-07-06 – 2020-07-09 (×8): 0.5 mg via RESPIRATORY_TRACT
  Filled 2020-07-06 (×9): qty 2.5

## 2020-07-06 MED ORDER — HEPARIN BOLUS VIA INFUSION
1500.0000 [IU] | Freq: Once | INTRAVENOUS | Status: AC
  Administered 2020-07-06: 1500 [IU] via INTRAVENOUS
  Filled 2020-07-06: qty 1500

## 2020-07-06 MED ORDER — HEPARIN (PORCINE) 25000 UT/250ML-% IV SOLN
1650.0000 [IU]/h | INTRAVENOUS | Status: DC
  Administered 2020-07-06 (×2): 1450 [IU]/h via INTRAVENOUS
  Administered 2020-07-08: 1650 [IU]/h via INTRAVENOUS
  Filled 2020-07-06 (×4): qty 250

## 2020-07-06 MED ORDER — METOPROLOL TARTRATE 50 MG PO TABS
100.0000 mg | ORAL_TABLET | Freq: Two times a day (BID) | ORAL | Status: DC
  Administered 2020-07-06 – 2020-07-09 (×5): 100 mg via ORAL
  Filled 2020-07-06 (×5): qty 2

## 2020-07-06 MED ORDER — CHLORHEXIDINE GLUCONATE CLOTH 2 % EX PADS
6.0000 | MEDICATED_PAD | Freq: Every day | CUTANEOUS | Status: DC
  Administered 2020-07-06 – 2020-07-07 (×2): 6 via TOPICAL

## 2020-07-06 MED ORDER — DILTIAZEM HCL-DEXTROSE 125-5 MG/125ML-% IV SOLN (PREMIX)
5.0000 mg/h | INTRAVENOUS | Status: DC
  Administered 2020-07-06: 5 mg/h via INTRAVENOUS
  Administered 2020-07-06: 15 mg/h via INTRAVENOUS
  Filled 2020-07-06 (×3): qty 125

## 2020-07-06 MED ORDER — LEVALBUTEROL HCL 0.63 MG/3ML IN NEBU
0.6300 mg | INHALATION_SOLUTION | Freq: Three times a day (TID) | RESPIRATORY_TRACT | Status: DC
  Administered 2020-07-06 – 2020-07-09 (×8): 0.63 mg via RESPIRATORY_TRACT
  Filled 2020-07-06 (×9): qty 3

## 2020-07-06 MED ORDER — METOPROLOL TARTRATE 25 MG PO TABS
25.0000 mg | ORAL_TABLET | Freq: Two times a day (BID) | ORAL | Status: DC
  Administered 2020-07-06: 25 mg via ORAL
  Filled 2020-07-06: qty 1

## 2020-07-06 NOTE — Progress Notes (Signed)
ANTICOAGULATION CONSULT NOTE  Pharmacy Consult for Heparin Indication: atrial fibrillation  No Known Allergies  Patient Measurements: Height: 6' (182.9 cm) Weight: 122.9 kg (271 lb) IBW/kg (Calculated) : 77.6 HEPARIN DW (KG): 104.8  Vital Signs: Temp: 97.8 F (36.6 C) (01/23 0408) Temp Source: Oral (01/23 0408) BP: 112/69 (01/23 0408) Pulse Rate: 68 (01/23 0408)  Labs: Recent Labs    07/03/20 2031 07/03/20 2246 07/04/20 0246 07/05/20 0359  HGB 15.9  --  14.9 14.4  HCT 49.8  --  45.3 45.9  PLT 234  --  217 150  CREATININE 2.16*  --  2.00* 1.75*  TROPONINIHS 31* 31*  --   --    Estimated Creatinine Clearance: 63 mL/min (A) (by C-G formula based on SCr of 1.75 mg/dL (H)).  Medical History: Past Medical History:  Diagnosis Date  . CHF (congestive heart failure) ([place])   . Chronic kidney disease   . COPD (chronic obstructive pulmonary disease) ([place])   . Coronary artery disease   . Depression   . Diabetes mellitus without complication ([place])   . GERD (gastroesophageal reflux disease)   . Gout   . Hypertension   . Influenza A with respiratory manifestations   . Mental disorder    Medications:  Medications Prior to Admission  Medication Sig Dispense Refill Last Dose  . albuterol (PROVENTIL) (2.5 MG/3ML) 0.083% nebulizer solution Take 3 mLs by nebulization every 6 (six) hours as needed for shortness of breath. 90 mL 1 prn at prn  . albuterol (VENTOLIN HFA) 108 (90 Base) MCG/ACT inhaler Inhale 2 puffs into the lungs every 6 (six) hours as needed for wheezing or shortness of breath. 8.5 g 2 prn at prn  . allopurinol (ZYLOPRIM) 100 MG tablet Take 2 tablets (200 mg total) by mouth daily. 60 tablet 4 unknown at unknown  . atorvastatin (LIPITOR) 40 MG tablet Take 1 tablet (40 mg total) by mouth daily at 6 PM. 30 tablet 2 unknown at unknown  . budesonide-formoterol (SYMBICORT) 160-4.5 MCG/ACT inhaler Inhale 2 puffs into the lungs 2 (two) times daily. 1 each 2 unknown at unknown   . carvedilol (COREG) 25 MG tablet Take 1 tablet (25 mg total) by mouth 2 (two) times daily with a meal. 60 tablet 4 unknown at unknown  . diclofenac Sodium (VOLTAREN) 1 % GEL Apply 2 g topically 4 (four) times daily. Use on elbow pain 50 g 3 unknown at unknown  . fluticasone (FLONASE) 50 MCG/ACT nasal spray Place 1 spray into both nostrils daily as needed for allergies or rhinitis. 16 g 2 prn at prn  . furosemide (LASIX) 40 MG tablet Take 1.5 tablets (60 mg total) by mouth daily. 45 tablet 2 unknown at unknown  . glipiZIDE (GLUCOTROL) 5 MG tablet Take 0.5 tablets (2.5 mg total) by mouth 2 (two) times daily before a meal. 30 tablet 4 unknown at unknown  . isosorbide-hydrALAZINE (BIDIL) 20-37.5 MG tablet Take 1 tablet by mouth 3 (three) times daily. 90 tablet 4 unknown at unknown  . montelukast (SINGULAIR) 10 MG tablet Take 1 tablet (10 mg total) by mouth at bedtime. 30 tablet 2 unknown at unknown  . predniSONE (DELTASONE) 20 MG tablet Take 2 tabs PO daily x 2 days, then 1.5 tab daily x 2 days then 1 tab x2 days then 1/2 tab x 2 days 10 tablet 0 unknown at unknown  . sacubitril-valsartan (ENTRESTO) 24-26 MG Take 1 tablet by mouth 2 (two) times daily. 60 tablet 4 unknown at unknown  .  aspirin 81 MG EC tablet Take 1 tablet (81 mg total) by mouth daily. 30 tablet 2 unknown at unknown    Assessment: 60 y.o. male with medical history significant of diastolic and systolic heart failure, COPD on chronic oxygen, CKD 3, depression, hypertension, hyperlipidemia, gout, diabetes who presents with chest pain and worsening shortness of breath No anticoagulants PTA per med list.  H&H, platelets stable and wnl.  Goal of Therapy:  Heparin level 0.3-0.7 units/ml Monitor platelets by anticoagulation protocol: Yes   Plan:   1st anti-Xa level therapeutic  Continue heparin infusion at 1450 units/hr  recheck anti-Xa level in 6 hours   Repeat CBC in am  Dallie Piles 07/06/2020,7:09 AM

## 2020-07-06 NOTE — Progress Notes (Signed)
ANTICOAGULATION CONSULT NOTE  Pharmacy Consult for Heparin Indication: atrial fibrillation  No Known Allergies  Patient Measurements: Height: 6' (182.9 cm) Weight: 122.9 kg (271 lb) IBW/kg (Calculated) : 77.6 HEPARIN DW (KG): 104.8  Vital Signs: Temp: 98.4 F (36.9 C) (01/23 1600) BP: 88/70 (01/23 1830) Pulse Rate: 62 (01/23 1830)  Labs: Recent Labs    07/03/20 2031 07/03/20 2246 07/04/20 0246 07/05/20 0359 07/06/20 1118 07/06/20 1819  HGB 15.9  --  14.9 14.4 15.7  --   HCT 49.8  --  45.3 45.9 49.0  --   PLT 234  --  217 150 256  --   HEPARINUNFRC  --   --   --   --  0.35 0.27*  CREATININE 2.16*  --  2.00* 1.75* 1.83*  --   TROPONINIHS 31* 31*  --   --   --   --    Estimated Creatinine Clearance: 60.3 mL/min (A) (by C-G formula based on SCr of 1.83 mg/dL (H)).  Medical History: Past Medical History:  Diagnosis Date  . CHF (congestive heart failure) ([place])   . Chronic kidney disease   . COPD (chronic obstructive pulmonary disease) ([place])   . Coronary artery disease   . Depression   . Diabetes mellitus without complication ([place])   . GERD (gastroesophageal reflux disease)   . Gout   . Hypertension   . Influenza A with respiratory manifestations   . Mental disorder    Medications:  Medications Prior to Admission  Medication Sig Dispense Refill Last Dose  . albuterol (PROVENTIL) (2.5 MG/3ML) 0.083% nebulizer solution Take 3 mLs by nebulization every 6 (six) hours as needed for shortness of breath. 90 mL 1 prn at prn  . albuterol (VENTOLIN HFA) 108 (90 Base) MCG/ACT inhaler Inhale 2 puffs into the lungs every 6 (six) hours as needed for wheezing or shortness of breath. 8.5 g 2 prn at prn  . allopurinol (ZYLOPRIM) 100 MG tablet Take 2 tablets (200 mg total) by mouth daily. 60 tablet 4 unknown at unknown  . atorvastatin (LIPITOR) 40 MG tablet Take 1 tablet (40 mg total) by mouth daily at 6 PM. 30 tablet 2 unknown at unknown  . budesonide-formoterol (SYMBICORT) 160-4.5  MCG/ACT inhaler Inhale 2 puffs into the lungs 2 (two) times daily. 1 each 2 unknown at unknown  . carvedilol (COREG) 25 MG tablet Take 1 tablet (25 mg total) by mouth 2 (two) times daily with a meal. 60 tablet 4 unknown at unknown  . diclofenac Sodium (VOLTAREN) 1 % GEL Apply 2 g topically 4 (four) times daily. Use on elbow pain 50 g 3 unknown at unknown  . fluticasone (FLONASE) 50 MCG/ACT nasal spray Place 1 spray into both nostrils daily as needed for allergies or rhinitis. 16 g 2 prn at prn  . furosemide (LASIX) 40 MG tablet Take 1.5 tablets (60 mg total) by mouth daily. 45 tablet 2 unknown at unknown  . glipiZIDE (GLUCOTROL) 5 MG tablet Take 0.5 tablets (2.5 mg total) by mouth 2 (two) times daily before a meal. 30 tablet 4 unknown at unknown  . isosorbide-hydrALAZINE (BIDIL) 20-37.5 MG tablet Take 1 tablet by mouth 3 (three) times daily. 90 tablet 4 unknown at unknown  . montelukast (SINGULAIR) 10 MG tablet Take 1 tablet (10 mg total) by mouth at bedtime. 30 tablet 2 unknown at unknown  . predniSONE (DELTASONE) 20 MG tablet Take 2 tabs PO daily x 2 days, then 1.5 tab daily x 2 days then 1 tab  x2 days then 1/2 tab x 2 days 10 tablet 0 unknown at unknown  . sacubitril-valsartan (ENTRESTO) 24-26 MG Take 1 tablet by mouth 2 (two) times daily. 60 tablet 4 unknown at unknown  . aspirin 81 MG EC tablet Take 1 tablet (81 mg total) by mouth daily. 30 tablet 2 unknown at unknown    Assessment: 60 y.o. male with medical history significant of diastolic and systolic heart failure, COPD on chronic oxygen, CKD 3, depression, hypertension, hyperlipidemia, gout, diabetes who presents with chest pain and worsening shortness of breath No anticoagulants PTA per med list.  H&H, platelets stable and wnl.  Date Time HL Rate/comment 1/23 1118 0.35 1450 units/hr 1/23 1819 0.27 1450 units/hr  Goal of Therapy:  Heparin level 0.3-0.7 units/ml Monitor platelets by anticoagulation protocol: Yes   Plan:   anti-Xa  level subtherapeutic  Bolus 1500 units  Increase heparin infusion to 1650 units/hr  recheck anti-Xa level in 6 hours   Repeat CBC in am  Darnelle Bos 07/06/2020,7:17 PM

## 2020-07-06 NOTE — Progress Notes (Addendum)
    BRIEF OVERNIGHT PROGRESS REPORT   SUBJECTIVE: Notified by patient's RN Lorrin Jackson @02 :20 am that patient's HR sustaining in the 120's. On review of flowsheet, it appears that he has been in this rhythm since beginning of shift with HR in the 130s. Prior EKG on admission reviewed as well, per RN the telemetry monitor showing sinus tachycardia throughout night?  OBJECTIVE: He is afebrile with most recent blood pressure 94/72 mm Hg and pulse rate 129 beats/min.    ASSESSMENT: 60 y.o male with PMH significant for diastolic and systolic HF, COPD on chronic oxygen use, CKD3 HTN, HLD, Gout and DM admitted with acute hypoxic respiratory failure secondary to CHF exacerbation and possible COPD now with new onset atrial flutter on EKG.  PLAN:  Atrial Flutter with 2:1 AV Conduction likely in the setting of worsening heat failure? Now with borderline hypotension likely in the setting of atrial flutter versus medication?  -Transfer to cardiac progressive for Amiodarone gtt  - EKG+Telemetry - Troponins, BMP and Mag - TSH, FT4 - TTEcho with  EF reduced to 20-25%. Moderately dilated RV with moderately reduced function. LV global hypokinesia - Avoid AV nodal blocking agents due to risk of complete block - Will avoid Diltiazem in the setting of worsening  heart failure) - Start Metoprolol 25 mg po BID  # Thromboembolism Risk Management CHADS2 = 3 -Start Heparin gtt - Discussed EKG finding with on call Cardiologist Dr. Nehemiah Massed who recommends starting Heparin gtt for possible cardioversion this morning. Will also give metoprolol low dose per his recommendations.      Rufina Falco, BSN, MSN, DNP, CCRN,FNP-C, AGACNP-BC [facility] Nurse Practitioner  [facility]

## 2020-07-06 NOTE — Progress Notes (Signed)
Have been trying since 11pm to put patient on cpap. However, every time I go in the room patient is eating and is requesting more to eat.  I stressed the importance of allowing enough time to lapse between eating and putting him on cpap to avoid upset stomach or possible aspiration. Has not worked thus far.

## 2020-07-06 NOTE — Progress Notes (Signed)
ANTICOAGULATION CONSULT NOTE - Initial Consult  Pharmacy Consult for Heparin Indication: atrial fibrillation  No Known Allergies  Patient Measurements: Height: 6' (182.9 cm) Weight: 122.9 kg (271 lb) IBW/kg (Calculated) : 77.6 HEPARIN DW (KG): 104.8  Vital Signs: Temp: 97.2 F (36.2 C) (01/22 2359) Temp Source: Oral (01/22 2359) BP: 94/72 (01/22 2359) Pulse Rate: 110 (01/22 2359)  Labs: Recent Labs    07/03/20 2031 07/03/20 2246 07/04/20 0246 07/05/20 0359  HGB 15.9  --  14.9 14.4  HCT 49.8  --  45.3 45.9  PLT 234  --  217 150  CREATININE 2.16*  --  2.00* 1.75*  TROPONINIHS 31* 31*  --   --    Estimated Creatinine Clearance: 63 mL/min (A) (by C-G formula based on SCr of 1.75 mg/dL (H)).  Medical History: Past Medical History:  Diagnosis Date  . CHF (congestive heart failure) ([place])   . Chronic kidney disease   . COPD (chronic obstructive pulmonary disease) ([place])   . Coronary artery disease   . Depression   . Diabetes mellitus without complication ([place])   . GERD (gastroesophageal reflux disease)   . Gout   . Hypertension   . Influenza A with respiratory manifestations   . Mental disorder    Medications:  Medications Prior to Admission  Medication Sig Dispense Refill Last Dose  . albuterol (PROVENTIL) (2.5 MG/3ML) 0.083% nebulizer solution Take 3 mLs by nebulization every 6 (six) hours as needed for shortness of breath. 90 mL 1 prn at prn  . albuterol (VENTOLIN HFA) 108 (90 Base) MCG/ACT inhaler Inhale 2 puffs into the lungs every 6 (six) hours as needed for wheezing or shortness of breath. 8.5 g 2 prn at prn  . allopurinol (ZYLOPRIM) 100 MG tablet Take 2 tablets (200 mg total) by mouth daily. 60 tablet 4 unknown at unknown  . atorvastatin (LIPITOR) 40 MG tablet Take 1 tablet (40 mg total) by mouth daily at 6 PM. 30 tablet 2 unknown at unknown  . budesonide-formoterol (SYMBICORT) 160-4.5 MCG/ACT inhaler Inhale 2 puffs into the lungs 2 (two) times daily. 1 each 2  unknown at unknown  . carvedilol (COREG) 25 MG tablet Take 1 tablet (25 mg total) by mouth 2 (two) times daily with a meal. 60 tablet 4 unknown at unknown  . diclofenac Sodium (VOLTAREN) 1 % GEL Apply 2 g topically 4 (four) times daily. Use on elbow pain 50 g 3 unknown at unknown  . fluticasone (FLONASE) 50 MCG/ACT nasal spray Place 1 spray into both nostrils daily as needed for allergies or rhinitis. 16 g 2 prn at prn  . furosemide (LASIX) 40 MG tablet Take 1.5 tablets (60 mg total) by mouth daily. 45 tablet 2 unknown at unknown  . glipiZIDE (GLUCOTROL) 5 MG tablet Take 0.5 tablets (2.5 mg total) by mouth 2 (two) times daily before a meal. 30 tablet 4 unknown at unknown  . isosorbide-hydrALAZINE (BIDIL) 20-37.5 MG tablet Take 1 tablet by mouth 3 (three) times daily. 90 tablet 4 unknown at unknown  . montelukast (SINGULAIR) 10 MG tablet Take 1 tablet (10 mg total) by mouth at bedtime. 30 tablet 2 unknown at unknown  . predniSONE (DELTASONE) 20 MG tablet Take 2 tabs PO daily x 2 days, then 1.5 tab daily x 2 days then 1 tab x2 days then 1/2 tab x 2 days 10 tablet 0 unknown at unknown  . sacubitril-valsartan (ENTRESTO) 24-26 MG Take 1 tablet by mouth 2 (two) times daily. 60 tablet 4 unknown at  unknown  . aspirin 81 MG EC tablet Take 1 tablet (81 mg total) by mouth daily. 30 tablet 2 unknown at unknown    Assessment: Heparin for afib.  No anticoagulants PTA per med list.  Pt was on SQ Heparin 5000 units, last dose was last evening ~ 2200  Goal of Therapy:  Heparin level 0.3-0.7 units/ml Monitor platelets by anticoagulation protocol: Yes   Plan:  Heparin bolus 4000 units x 1 then infusion at 1450 units/hr Check HL ~ 6 hours after heparin started  Hart Robinsons A 07/06/2020,3:57 AM

## 2020-07-06 NOTE — Progress Notes (Signed)
PROGRESS NOTE    Chris [name]  TIW:[id number] DOB: 1960/07/03 DOA: 07/03/2020 PCP: [name], MD    Chief Complaint  Patient presents with  . Shortness of Breath    Brief Narrative:  HPI per Dr. Ninetta Lights is a 61 y.o. male with medical history significant for diastolic and systolic heart failure, COPD on chronic oxygen, CKD 3, depression, hypertension, hyperlipidemia, gout, diabetes who presents with chest pain and worsening shortness of breat. Per chart in the [facility] sat was in the 70s and pt was switched over to CPAP. Patient had note has interim history of recent admission 06/16/2020-06/18/2020 at which time patient was diagnosed with Acute CHF exacerbation with acute hypoxic respiratory , patient was unfortunately at that time discharged AMA. He then presented to ED on 1/13 with sob/wheezing from jail he was found to have acute CHF he was treated in ed and lasix home dosage was increased and he was discharged to outpatient care with a course of steroids as well.   He now returns with similar complaint of acute onset of sob, wheezing, chest tightness x for less than one day.He also has associated complaints of cough, as well as tenderness of abdomen s/p coughing episodes. He notes no f/c/n/v/d/.   ED Course:  98.3, bp 134/88, hr 79, rr 19 , sat 98% on bipap  Wbc: 15.3,hgb 15.9, pmn 11.4, mag 2  Na 140, K 4.5, cl 101, glu 42, cr 2.16 at around base ce 31 [id number] less than prior 2000's UA :neg Vbg: 7.37/59 covid resp panel:neg  tx solumedrol, douneb Lasix 40mg   lactic 2  procal 0.15 tx , ctx/ azithromycin  ekg nsr lvh, with ant/lateral nonspecific twave findings  Assessment & Plan:   Principal Problem:   Acute and chronic respiratory failure (acute-on-chronic) (HCC) Active Problems:   Chronic systolic heart failure (HCC)   Mixed hyperlipidemia   Essential hypertension   CKD (chronic kidney disease) stage 3, GFR 30-59 ml/min (HCC)   Tobacco  dependence   History of gout   Acute on chronic combined systolic and diastolic CHF (congestive heart failure) (HCC)   Type 2 diabetes mellitus with stage 3 chronic kidney disease (HCC)   COPD with acute exacerbation (HCC)   Acute respiratory failure with hypoxia (HCC)   Typical atrial flutter (HCC)  1 acute respiratory failure with hypoxia secondary to acute on chronic combined systolic and diastolic heart failure exacerbation and possible acute COPD exacerbation Patient presented with worsening shortness of breath, noted to be hypoxic with sats in the 70s, complaining of chest tightness, noted to have crackles on examination.   Patient with prior 2D echo with EF of 20 to 25% with global hypokinesis.  Cardiac enzymes elevated but flattened.  2D echo not repeated as one recently done.   Patient on Lasix 40 mg IV every 12 hours with a urine output of 4.650 L over the past 24 hours. Weights not recorded. Patient with clinical improvement. Patient was on BiPAP on admission currently off BiPAP and on room air. Continue current dose of IV Lasix 40 mg every 12 hours. Cardiac medications adjusted due to onset of atrial flutter and patient now on Lopressor 100 mg twice daily, BiDil, aspirin, statin.  Entresto discontinued per cardiology. Continue bronchodilators, steroids, azithromycin, CPAP nightly. Cardiology following recommending continued current medical management with close outpatient follow-up with cardiology.  2.  New onset atrial flutter Patient noted to go into atrial flutter overnight requiring transfer to the stepdown unit.  Patient started on Cardizem drip per cardiology in addition to Lopressor 100 mg twice daily.  Patient on heparin drip for anticoagulation per cardiology.  Per cardiology if patient does not spontaneously convert to normal sinus rhythm will need a TEE with cardioversion tomorrow.  3.  Mild acute exacerbation of COPD Patient presenting with shortness of breath with chest  tightness, some crackles, concern for possible COPD exacerbation.  Patient with clinical improvement with chest tightness. Patient also on IV diuretics. Patient given a dose of IV Solu-Medrol currently on prednisone 40 mg daily which we will continue. Continue scheduled DuoNebs, Claritin, PPI, Flonase, Pulmicort nebs, azithromycin. Incentive spirometry. Flutter valve. Tobacco cessation stressed to patient. Outpatient follow-up.  4.  Elevated lactic acid level/leukocytosis Questionable etiology.  Patient currently afebrile.  Urinalysis unremarkable.  Blood cultures ordered and pending with no growth to date.  On empiric azithromycin.   5.  Chronic kidney disease stage IIIb Baseline creatinine approximately 1.9-2. Creatinine currently 1.83 with diuresis. Follow.   6.  Hypertension Blood pressure improved.  Patient however noted to going to a flutter overnight and Coreg discontinued and patient currently on Lopressor 100 mg twice daily per cardiology recommendations in addition to being on a Cardizem drip.  Patient also on IV Lasix.  Continue BiDil.  Entresto discontinued per cardiology.  Follow-up.    7.  Hyperlipidemia Continue statin.  8.  OSA CPAP nightly  9.  GERD PPI.  10.  History of gout Continue allopurinol.  11.  Polysubstance abuse Cessation stressed to patient.  TOC consult.  12.  Constipation Sorbitol p.o. x2 doses.  MiraLAX daily.  If no results enema.    DVT prophylaxis: Heparin Code Status: Full Family Communication: Updated patient.  No family at bedside. Disposition:   Status is: Inpatient    Dispo: The patient is from: Home              Anticipated d/c is to: Home              Anticipated d/c date is: 2-3 days              Patient currently on IV Lasix, IV heparin, IV Cardizem drip not stable for discharge.       Consultants:   Cardiology: Dr Nehemiah Massed 07/04/2020  Procedures:   Chest x-ray 07/03/2020  Antimicrobials:   Oral azithromycin  07/04/2020>>>  IV azithromycin 1/20 /2022x1 dose  IV Rocephin 1/20/ 2022x1 dose   Subjective: Frustrated.  Denies any chest pain.  No shortness of breath.  Patient noted to go into atrial flutter overnight placed on heparin drip and also currently on the Cardizem drip in the ICU/stepdown unit.  Patient also with some complaints of abdominal discomfort states he is feeling constipated. Per RN patient has been rude today.  Objective: Vitals:   07/06/20 1100 07/06/20 1200 07/06/20 1300 07/06/20 1418  BP:  120/89    Pulse: (!) 117 90  89  Resp: (!) 30 (!) 21 19 16   Temp:      TempSrc:      SpO2: 96% 98% 100% 94%  Weight:      Height:        Intake/Output Summary (Last 24 hours) at 07/06/2020 1620 Last data filed at 07/06/2020 1418 Gross per 24 hour  Intake 840 ml  Output 6150 ml  Net -5310 ml   Filed Weights   07/03/20 2031  Weight: 122.9 kg    Examination:  General exam: NAD Respiratory system: Lungs clear to auscultation  bilaterally.  No wheezes, no crackles, no rhonchi.  Normal respiratory effort.  Cardiovascular system: Irregularly irregular.  No JVD, no murmurs rubs or gallops.  No lower extremity edema.  Gastrointestinal system: Abdomen is soft, nontender, nondistended, positive bowel sounds.  No rebound.  No guarding.  Central nervous system: Alert and oriented. No focal neurological deficits. Extremities: Symmetric 5 x 5 power. Skin: No rashes, lesions or ulcers Psychiatry: Judgement and insight appear normal. Mood & affect appropriate.     Data Reviewed: I have personally reviewed following labs and imaging studies  CBC: Recent Labs  Lab 07/03/20 2031 07/04/20 0246 07/05/20 0359 07/06/20 1118  WBC 15.3* 13.4* 16.4* 15.1*  NEUTROABS 11.4*  --  13.8* 10.2*  HGB 15.9 14.9 14.4 15.7  HCT 49.8 45.3 45.9 49.0  MCV 92.7 91.7 94.4 91.8  PLT 234 217 150 [id number]    Basic Metabolic Panel: Recent Labs  Lab 07/03/20 2031 07/04/20 0246 07/05/20 0359  07/06/20 1118  NA 140  --  138 136  K 4.5  --  4.6 4.0  CL 101  --  99 102  CO2 28  --  25 25  GLUCOSE 42*  --  163* 158*  BUN 25*  --  40* 43*  CREATININE 2.16* 2.00* 1.75* 1.83*  CALCIUM 8.8*  --  8.6* 8.4*  MG 2.0  --  2.1 2.0    GFR: Estimated Creatinine Clearance: 60.3 mL/min (A) (by C-G formula based on SCr of 1.83 mg/dL (H)).  Liver Function Tests: Recent Labs  Lab 07/03/20 2031  AST 15  ALT 20  ALKPHOS 77  BILITOT 0.7  PROT 7.1  ALBUMIN 3.4*    CBG: Recent Labs  Lab 07/05/20 1130 07/05/20 1615 07/05/20 2211 07/06/20 1118 07/06/20 1528  GLUCAP 123* 161* 152* 140* 128*     Recent Results (from the past 240 hour(s))  SARS Coronavirus 2 by RT PCR (hospital order, performed in [facility] hospital lab) Nasopharyngeal Nasopharyngeal Swab     Status: None   Collection Time: 07/03/20  8:34 PM   Specimen: Nasopharyngeal Swab  Result Value Ref Range Status   SARS Coronavirus 2 NEGATIVE NEGATIVE Final    Comment: (NOTE) SARS-CoV-2 target nucleic acids are NOT DETECTED.  The SARS-CoV-2 RNA is generally detectable in upper and lower respiratory specimens during the acute phase of infection. The lowest concentration of SARS-CoV-2 viral copies this assay can detect is 250 copies / mL. A negative result does not preclude SARS-CoV-2 infection and should not be used as the sole basis for treatment or other patient management decisions.  A negative result may occur with improper specimen collection / handling, submission of specimen other than nasopharyngeal swab, presence of viral mutation(s) within the areas targeted by this assay, and inadequate number of viral copies (<250 copies / mL). A negative result must be combined with clinical observations, patient history, and epidemiological information.  Fact Sheet for Patients:   StrictlyIdeas.no  Fact Sheet for Healthcare Providers: BankingDealers.co.za  This test  is not yet approved or  cleared by the [place] FDA and has been authorized for detection and/or diagnosis of SARS-CoV-2 by FDA under an Emergency Use Authorization (EUA).  This EUA will remain in effect (meaning this test can be used) for the duration of the COVID-19 declaration under Section 564(b)(1) of the Act, 21 U.S.C. section 360bbb-3(b)(1), unless the authorization is terminated or revoked sooner.  Performed at [facility], [address], [place], [place] [address]   Culture, blood (  routine x 2)     Status: None (Preliminary result)   Collection Time: 07/03/20 10:45 PM   Specimen: BLOOD  Result Value Ref Range Status   Specimen Description BLOOD RIGHT ASSIST CONTROL  Final   Special Requests   Final    BOTTLES DRAWN AEROBIC AND ANAEROBIC Blood Culture adequate volume   Culture   Final    NO GROWTH 3 DAYS Performed at [facility], [address] [place], [place] [address]    Report Status PENDING  Incomplete  Culture, blood (routine x 2)     Status: None (Preliminary result)   Collection Time: 07/03/20 10:45 PM   Specimen: BLOOD  Result Value Ref Range Status   Specimen Description BLOOD LEFT HAND  Final   Special Requests   Final    BOTTLES DRAWN AEROBIC AND ANAEROBIC Blood Culture adequate volume   Culture   Final    NO GROWTH 3 DAYS Performed at [facility], [address], [place], [place] [address]    Report Status PENDING  Incomplete  MRSA PCR Screening     Status: None   Collection Time: 07/06/20  7:00 AM   Specimen: Nasopharyngeal  Result Value Ref Range Status   MRSA by PCR NEGATIVE NEGATIVE Final    Comment:        The GeneXpert MRSA Assay (FDA approved for NASAL specimens only), is one component of a comprehensive MRSA colonization surveillance program. It is not intended to diagnose MRSA infection nor to guide or monitor treatment for MRSA infections. Performed at [facility], [address],  [place], [place] [address]          Radiology Studies: No results found.      Scheduled Meds: . allopurinol  200 mg Oral Daily  . aspirin EC  81 mg Oral Daily  . atorvastatin  40 mg Oral q1800  . azithromycin  500 mg Oral Daily  . budesonide (PULMICORT) nebulizer solution  0.5 mg Nebulization BID  . Chlorhexidine Gluconate Cloth  6 each Topical Daily  . fluticasone  2 spray Each Nare Daily  . furosemide  40 mg Intravenous Q12H  . insulin aspart  0-9 Units Subcutaneous TID WC  . ipratropium  0.5 mg Nebulization Q6H  . isosorbide-hydrALAZINE  1 tablet Oral TID  . levalbuterol  0.63 mg Nebulization Q6H  . loratadine  10 mg Oral Daily  . metoprolol tartrate  100 mg Oral BID  . montelukast  10 mg Oral QHS  . pantoprazole  40 mg Oral Q0600  . polyethylene glycol  17 g Oral Daily  . predniSONE  40 mg Oral Q breakfast  . sodium chloride flush  3 mL Intravenous Q12H  . sorbitol  30 mL Oral Q2H   Continuous Infusions: . sodium chloride    . diltiazem (CARDIZEM) infusion 10 mg/hr (07/06/20 0957)  . heparin 1,450 Units/hr (07/06/20 0443)     LOS: 2 days    Time spent: 40 minutes    Irine Seal, MD [facility]   To contact the attending provider between 7A-7P or the covering provider during after hours 7P-7A, please log into the web site [email or web address] and access using universal [place] password for that web site. If you do not have the password, please call the hospital operator.  07/06/2020, 4:20 PM

## 2020-07-06 NOTE — Progress Notes (Addendum)
0750 Received from 2 C at shift change. Patient in Afib RVR. After IV started by IV team started on Cardizem at 5 and titrated to 15 to slow rate. Also given scheduled Lasix. Voided 2000 clear yellow. After taking his regularly scheduled Metoprolol heart rate at 1330 is 88 but remains in Atrial Flutter. Patient is rude and yelled at nurse, Chartered certified accountant and NT. Im not your slave and Im not your dog. Don't touch me.  Dr.Thompson in and talked with patient about his use of the F-word and yelling at others.Patient more tolerable with staff now.

## 2020-07-06 NOTE — Progress Notes (Signed)
Patient being rude to nurse with care giving. Nurse went in to start patients heparin drip upon NP orders entered due to patients heart rhythm and patient asked nurse "are they going to l;et me out of here tomorrow" nurse stated to patient "i'm not sure because you have an abnormal heart rhythm now that the provider is concerned about. Patient then stated " oh lord i'm getting out of here something is not right" nurse asked patient "what's not right"? Patient then stated "yall scaring me you don;t know what you are doing and you are not giving me anything". Nurse educated the patient that he has the right to refuse care, but it could be detrimental to his health status at this time. Patient then stated to nurse " yea ya'll been in and out my room all night I can't even get no fucking sleep" nurse alerted patient that we are in and out due to his heart monitor alerting [place] something is wrong. As nurse began to prepare drip patient stated "these don't even work" he was speaking about his IV's. Nurse asked patient "what do you mean they do not worked they flushed fine earlier when I gave your meds. Nurse went to flush IV's and the one in the right arm was leaking nurse notified patient of this and told him she would remove it. Nurse then flushed left wrist IV and notified patient that it was fine. Nurse then went back to prepare heparin drip and patient stated "take this out now" nurse notified patient that she would as soon as she finished preparing drip and gab supplies needed. Patient then stated "this is some bullshit". Nurse then asked patient to not speak in that manner as it is rude and she was trying to help him, patient stated "whatever" then laid on the bed. Nurse asked patient if he could reach his arm over so she could scan wrist band as the side of room IV to left wrist was on was a tight space. Patient stated "no you can come over here and get it". Nurse moved items out of way to get to patient. Nurse  scanned patient, left room to get second nurse to come verify heparin drip.

## 2020-07-06 NOTE — Progress Notes (Signed)
Patient HR sustaining in 120's provider Rufina Falco, NP notified and requested patient's underlying rhythm nurse gave NP the reading showing on telemetry which was sinus tach. NP then requested EKG, nurse obtained EKG and notified NP. NP then told nurse that reading is actually A flutter and not sinus tach, nurse notified NP that she was going off tele and not EKG when underlying rhythm requested previously. Nurse then asked NP what she would like to do with reading and NP stated " If I wanted to do something about it I will let you know that is the reason I ordered the EKG". Nurse then asked NP if anything would be done pertaining to sustained tachy HR and no response has been given as of yet.

## 2020-07-06 NOTE — Progress Notes (Signed)
1430 Patient insisted on taking sponge bath. Refused help from NT and pulled out IV. Patient refused to use BSC and walked to toilet without assistance. Becoming impatient and insistent on up in room without help. Currently on Heparin and Cardizem drips to control heart rate and rhythm. Unable to reason with patient and make him understand we want him to be safe.

## 2020-07-06 NOTE — Progress Notes (Signed)
Received call from unit to come for patient visit. Upon arrival patient was sitting up watching t.v. Patient was very accepting of visit and shared in conversation. The over all concern was getting well enough to be discharged. Patient not only has medical issues, he has personal and family issues that he wants to effectively deal with. Was able to stay for a lengthy visit, until called away.

## 2020-07-06 NOTE — Progress Notes (Signed)
B/P up and down due to Cardizem. Requested that patient stay in the bed. Patient refused to comply and was up and down out of bed. He became diaphoretic and blood sugar was checked-128. Explained that his diaphoresis was due to the medication that was regulating his heart beat. Ask again to please stay in bed. Patient refused to comply. Dr Grandville Silos made aware of patients difficult behavior. Dr. Grandville Silos revisited the patient. Patient is obsessed with having a BM. Orders received. Given Gwendolyn Lima instead of Sorbitol for lesser diarrhea. Tried to give his Mira lax and his regular Atrovastin. Patient refused to take medication unless another nurse gave it. I was making him sick. Medications given by charge nurse. 1830 called into patient room. Stated he was having abdominal cramps.. Explained they were from the laxative he requested. Yelled that the statement was not true and we were messing with him. Cardizem drip decreased to 10 mg/hr.

## 2020-07-06 NOTE — Progress Notes (Signed)
[facility] Encounter Note  Patient: Chris [name] / Admit Date: 07/03/2020 / Date of Encounter: 07/06/2020, 7:36 AM   Subjective: 1/23.  Patient overall feels relatively the same from yesterday without evidence of chest pain worsening shortness of breath dizziness nausea or diaphoresis.  The patient has had continued mild cough and congestion.  He does have what appears to be acute on chronic obstructive pulmonary disease exacerbation which has driven some hypoxia.  Lungs have been clear although had admitted to due to having lower extremity edema and abdominal edema which is improved after furosemide use.  Glomerular filtration rate continues to be reasonable at 45.  No evidence of myocardial infarction with troponin at 31.  The patient has developed acute atrial flutter with rapid ventricular rate overnight for which she has received a slight amount of metoprolol which is not particularly helped and will need adjustments of medication management for better heart rate control and possible electrical cardioversion to normal rhythm.  We have discussed at length this regimen and potential treatment including electrical cardioversion and all of its risks and benefits.  The patient understands well  Review of Systems: Positive for: Shortness of breath cough congestion Negative for: Vision change, hearing change, syncope, dizziness, nausea, vomiting,diarrhea, bloody stool, stomach pain, positive for cough, congestion, negative for diaphoresis, urinary frequency, urinary pain,skin lesions, skin rashes Others previously listed  Objective: Telemetry: Atrial flutter with rapid ventricular rate Physical Exam: Blood pressure 112/69, pulse 68, temperature 97.8 F (36.6 C), temperature source Oral, resp. rate (!) 22, height 6' (1.829 m), weight 122.9 kg, SpO2 98 %. Body mass index is 36.75 kg/m. General: Well developed, well nourished, in no acute distress. Head: Normocephalic,  atraumatic, sclera non-icteric, no xanthomas, nares are without discharge. Neck: No apparent masses Lungs: Normal respirations with few diffuse wheezes, some rhonchi, no rales , no crackles   Heart: Regular rate and rhythm, normal S1 S2, no murmur, no rub, no gallop, PMI is normal size and placement, carotid upstroke normal without bruit, jugular venous pressure normal Abdomen: Soft, non-tender, non-distended with normoactive bowel sounds. No hepatosplenomegaly. Abdominal aorta is normal size without bruit Extremities: Trace edema, no clubbing, no cyanosis, no ulcers,  Peripheral: 2+ radial, 2+ femoral, 2+ dorsal pedal pulses Neuro: Alert and oriented. Moves all extremities spontaneously. Psych:  Responds to questions appropriately with a normal affect.   Intake/Output Summary (Last 24 hours) at 07/06/2020 0736 Last data filed at 07/06/2020 0414 Gross per 24 hour  Intake 600 ml  Output 4650 ml  Net -4050 ml    Inpatient Medications:  . allopurinol  200 mg Oral Daily  . aspirin EC  81 mg Oral Daily  . atorvastatin  40 mg Oral q1800  . azithromycin  500 mg Oral Daily  . budesonide (PULMICORT) nebulizer solution  0.5 mg Nebulization BID  . fluticasone  2 spray Each Nare Daily  . furosemide  40 mg Intravenous Q12H  . insulin aspart  0-9 Units Subcutaneous TID WC  . ipratropium  0.5 mg Nebulization Q6H  . isosorbide-hydrALAZINE  1 tablet Oral TID  . levalbuterol  0.63 mg Nebulization Q6H  . loratadine  10 mg Oral Daily  . metoprolol tartrate  25 mg Oral BID  . montelukast  10 mg Oral QHS  . pantoprazole  40 mg Oral Q0600  . predniSONE  40 mg Oral Q breakfast  . sacubitril-valsartan  1 tablet Oral BID  . sodium chloride flush  3 mL Intravenous Q12H   Infusions:  .  sodium chloride    . heparin 1,450 Units/hr (07/06/20 0443)    Labs: Recent Labs    07/03/20 2031 07/04/20 0246 07/05/20 0359  NA 140  --  138  K 4.5  --  4.6  CL 101  --  99  CO2 28  --  25  GLUCOSE 42*  --   163*  BUN 25*  --  40*  CREATININE 2.16* 2.00* 1.75*  CALCIUM 8.8*  --  8.6*  MG 2.0  --  2.1   Recent Labs    07/03/20 2031  AST 15  ALT 20  ALKPHOS 77  BILITOT 0.7  PROT 7.1  ALBUMIN 3.4*   Recent Labs    07/03/20 2031 07/04/20 0246 07/05/20 0359  WBC 15.3* 13.4* 16.4*  NEUTROABS 11.4*  --  13.8*  HGB 15.9 14.9 14.4  HCT 49.8 45.3 45.9  MCV 92.7 91.7 94.4  PLT 234 217 150   No results for input(s): CKTOTAL, CKMB, TROPONINI in the last 72 hours. Invalid input(s): POCBNP Recent Labs    07/05/20 0359  HGBA1C 6.4*     Weights: [employer]   07/03/20 2031  Weight: 122.9 kg     Radiology/Studies:  DG Chest 1 View  Result Date: 06/16/2020 CLINICAL DATA:  Chest pain and short of breath EXAM: CHEST  1 VIEW COMPARISON:  05/15/2020 FINDINGS: Cardiac enlargement without heart failure or edema. Hypoventilation with bibasilar atelectasis/infiltrate. No significant effusion. IMPRESSION: Hypoventilation with bibasilar atelectasis/infiltrate. Electronically Signed   By: Franchot Gallo M.D.   On: 06/16/2020 07:54   DG Chest 2 View  Result Date: 06/26/2020 CLINICAL DATA:  Shortness of breath. EXAM: CHEST - 2 VIEW COMPARISON:  06/16/2020 FINDINGS: The cardio pericardial silhouette is enlarged. There is pulmonary vascular congestion without overt pulmonary edema. Diffuse bilateral interstitial opacity suggest pulmonary edema. No substantial pleural effusion. The visualized bony structures of the thorax show no acute abnormality. IMPRESSION: Cardiomegaly with vascular congestion and probable interstitial edema. Electronically Signed   By: Misty Stanley M.D.   On: 06/26/2020 15:44   DG Chest Portable 1 View  Result Date: 07/03/2020 CLINICAL DATA:  Shortness of breath EXAM: PORTABLE CHEST 1 VIEW COMPARISON:  06/26/2020 FINDINGS: Cardiac shadow is stable. Aortic calcifications are again seen. Lungs are well aerated bilaterally. Mild vascular congestion is again seen. Bibasilar  atelectatic changes are noted. Artifact is noted overlying the anterior chest. No other focal abnormality is seen. IMPRESSION: Bibasilar atelectasis. Electronically Signed   By: Inez Catalina M.D.   On: 07/03/2020 20:44   ECHOCARDIOGRAM COMPLETE  Result Date: 06/17/2020    ECHOCARDIOGRAM REPORT   Patient Name:   Chris [name] Date of Exam: 06/17/2020 Medical Rec #:  [id number]       Height:       72.0 in Accession #:    [id number]      Weight:       264.4 lb Date of Birth:  Oct 17, 1960       BSA:          2.399 m Patient Age:    60 years        BP:           111/80 mmHg Patient Gender: M               HR:           71 bpm. Exam Location:  Inpatient Procedure: 2D Echo, Cardiac Doppler and Color Doppler Indications:    CHF  History:  Patient has prior history of Echocardiogram examinations, most                 recent 12/25/2019. CHF, CAD, COPD, Signs/Symptoms:Chest Pain;                 Risk Factors:Hypertension, Diabetes, Obesity and Current Smoker.  Sonographer:    Dustin Flock Referring Phys: [id number] [place]  1. Left ventricular ejection fraction, by estimation, is 20 to 25%. The left ventricle has severely decreased function. The left ventricle demonstrates global hypokinesis. The left ventricular internal cavity size was mildly to moderately dilated. There  is mild concentric left ventricular hypertrophy. Left ventricular diastolic parameters are consistent with Grade III diastolic dysfunction (restrictive). Elevated left atrial pressure.  2. Right ventricular systolic function is moderately reduced. The right ventricular size is moderately enlarged. There is mildly elevated pulmonary artery systolic pressure. The estimated right ventricular systolic pressure is 123XX123 mmHg.  3. Left atrial size was mildly dilated.  4. Right atrial size was mildly dilated.  5. The mitral valve is grossly normal. Mild mitral valve regurgitation. No evidence of mitral stenosis.  6. The aortic valve  is tricuspid. Aortic valve regurgitation is not visualized. No aortic stenosis is present.  7. The inferior vena cava is dilated in size with <50% respiratory variability, suggesting right atrial pressure of 15 mmHg. Comparison(s): Changes from prior study are noted. EF reduced to 20-25%. Moderately dilated RV with moderately reduced function. Grade 3 DD with elevated RVSP. FINDINGS  Left Ventricle: Left ventricular ejection fraction, by estimation, is 20 to 25%. The left ventricle has severely decreased function. The left ventricle demonstrates global hypokinesis. The left ventricular internal cavity size was mildly to moderately dilated. There is mild concentric left ventricular hypertrophy. Left ventricular diastolic parameters are consistent with Grade III diastolic dysfunction (restrictive). Elevated left atrial pressure. Right Ventricle: The right ventricular size is moderately enlarged. No increase in right ventricular wall thickness. Right ventricular systolic function is moderately reduced. There is mildly elevated pulmonary artery systolic pressure. The tricuspid regurgitant velocity is 2.62 m/s, and with an assumed right atrial pressure of 15 mmHg, the estimated right ventricular systolic pressure is 123XX123 mmHg. Left Atrium: Left atrial size was mildly dilated. Right Atrium: Right atrial size was mildly dilated. Pericardium: Trivial pericardial effusion is present. Mitral Valve: The mitral valve is grossly normal. Mild mitral valve regurgitation. No evidence of mitral valve stenosis. Tricuspid Valve: The tricuspid valve is grossly normal. Tricuspid valve regurgitation is trivial. No evidence of tricuspid stenosis. Aortic Valve: The aortic valve is tricuspid. Aortic valve regurgitation is not visualized. No aortic stenosis is present. Pulmonic Valve: The pulmonic valve was grossly normal. Pulmonic valve regurgitation is not visualized. No evidence of pulmonic stenosis. Aorta: The aortic root is normal in  size and structure. Venous: The inferior vena cava is dilated in size with less than 50% respiratory variability, suggesting right atrial pressure of 15 mmHg. IAS/Shunts: The atrial septum is grossly normal.  LEFT VENTRICLE PLAX 2D LVIDd:         6.50 cm  Diastology LVIDs:         5.70 cm  LV e' medial:    3.05 cm/s LV PW:         1.50 cm  LV E/e' medial:  29.6 LV IVS:        1.30 cm  LV e' lateral:   4.24 cm/s LVOT diam:     3.00 cm  LV E/e' lateral: 21.3 LV  SV:         115 LV SV Index:   48 LVOT Area:     7.07 cm  RIGHT VENTRICLE RV Basal diam:  4.80 cm RV S prime:     14.60 cm/s TAPSE (M-mode): 2.1 cm LEFT ATRIUM              Index       RIGHT ATRIUM           Index LA diam:        4.60 cm  1.92 cm/m  RA Area:     35.10 cm LA Vol (A2C):   103.0 ml 42.94 ml/m RA Volume:   149.00 ml 62.11 ml/m LA Vol (A4C):   86.8 ml  36.18 ml/m LA Biplane Vol: 99.1 ml  41.31 ml/m  AORTIC VALVE LVOT Vmax:   97.50 cm/s LVOT Vmean:  60.600 cm/s LVOT VTI:    0.163 m  AORTA Ao Root diam: 3.80 cm MITRAL VALVE               TRICUSPID VALVE MV Area (PHT): 3.42 cm    TR Peak grad:   27.5 mmHg MV Decel Time: 222 msec    TR Vmax:        262.00 cm/s MV E velocity: 90.40 cm/s MV A velocity: 31.70 cm/s  SHUNTS MV E/A ratio:  2.85        Systemic VTI:  0.16 m                            Systemic Diam: 3.00 cm Eleonore Chiquito MD Electronically signed by Eleonore Chiquito MD Signature Date/Time: 06/17/2020/4:57:34 PM    Final      Assessment and Recommendation  60 y.o. male with known dilated cardiomyopathy with acute on chronic systolic dysfunction congestive heart failure secondary to cough and congestion and COPD with hypertension hyperlipidemia without evidence of myocardial infarction but new onset atrial flutter with rapid ventricular rate needing further treatment options 1.  Begin metoprolol and increased dose to much higher dose to control heart rate to below 100 bpm and possible spontaneous conversion to normal sinus rhythm.  Would  also add diltiazem drip to help with heart rate control to reduce the possibility of acute exacerbation of heart failure and pulmonary edema. 2.  If patient does not spontaneously convert to normal sinus rhythm we will plan on transesophageal echocardiogram with electrical cardioversion tomorrow for this issue.  The patient understands all risk and benefits of this procedure and agrees 3.  Anticoagulation with heparin for further risk reduction and stroke with atrial fibrillation and atrial flutter 4.  Discontinuation of Entresto and potentially hydralazine if necessary for better ability to treat heart rate control 5.  Further treatment options after about  Signed, [name] M.D. FACC

## 2020-07-06 NOTE — TOC Progression Note (Signed)
Transition of Care [facility]) - Progression Note    Patient Details  Name: Chris [name] MRN: [id number] Date of Birth: 08-15-1960  [facility] of [facility]) CM/SW Contact  Izola Price, RN Phone Number: 07/06/2020, 4:55 PM  Clinical Narrative:    07/06/20 Remains in ICU on Lasix, Heparin, cardiac IV drips. Uncooperative with RN pulling out lines and getting OOB. Pt High Violence Risk. TOC consult for HH/SNF/DME needs as condition evolves. Also HFHS. HF RN consulted patient 07/04/20. Simmie Davies RN CM        Expected Discharge Plan and Services                                                 Social Determinants of Health (SDOH) Interventions    Readmission Risk Interventions Readmission Risk Prevention Plan 10/29/2019  Transportation Screening Complete  PCP or Specialist Appt within 3-5 Days Complete  HRI or [place] Complete  Social Work Consult for [place] Planning/Counseling Complete  Palliative Care Screening Not Applicable  Medication Review [employer]) Complete  Some recent data might be hidden

## 2020-07-07 ENCOUNTER — Other Ambulatory Visit: Payer: Self-pay

## 2020-07-07 LAB — GLUCOSE, CAPILLARY
Glucose-Capillary: 98 mg/dL (ref 70–99)
Glucose-Capillary: 149 mg/dL — ABNORMAL HIGH (ref 70–99)
Glucose-Capillary: 201 mg/dL — ABNORMAL HIGH (ref 70–99)

## 2020-07-07 LAB — URINE DRUG SCREEN, QUALITATIVE (ARMC ONLY)
Amphetamines, Ur Screen: NOT DETECTED
Barbiturates, Ur Screen: NOT DETECTED
Benzodiazepine, Ur Scrn: NOT DETECTED
Cannabinoid 50 Ng, Ur ~~LOC~~: NOT DETECTED
Cocaine Metabolite,Ur ~~LOC~~: POSITIVE — AB
MDMA (Ecstasy)Ur Screen: NOT DETECTED
Methadone Scn, Ur: NOT DETECTED
Opiate, Ur Screen: NOT DETECTED
Phencyclidine (PCP) Ur S: NOT DETECTED
Tricyclic, Ur Screen: NOT DETECTED

## 2020-07-07 LAB — BASIC METABOLIC PANEL
Anion gap: 13 (ref 5–15)
BUN: 53 mg/dL — ABNORMAL HIGH (ref 6–20)
CO2: 24 mmol/L (ref 22–32)
Calcium: 9 mg/dL (ref 8.9–10.3)
Chloride: 99 mmol/L (ref 98–111)
Creatinine, Ser: 2 mg/dL — ABNORMAL HIGH (ref 0.61–1.24)
GFR, Estimated: 38 mL/min — ABNORMAL LOW (ref >60)
Glucose, Bld: 119 mg/dL — ABNORMAL HIGH (ref 70–99)
Potassium: 5.4 mmol/L — ABNORMAL HIGH (ref 3.5–5.1)
Sodium: 136 mmol/L (ref 135–145)

## 2020-07-07 LAB — HEPARIN LEVEL (UNFRACTIONATED)
Heparin Unfractionated: 0.41 IU/mL (ref 0.30–0.70)
Heparin Unfractionated: 0.43 IU/mL (ref 0.30–0.70)

## 2020-07-07 LAB — PROTIME-INR
INR: 1.1 (ref 0.8–1.2)
Prothrombin Time: 13.3 seconds (ref 11.4–15.2)

## 2020-07-07 LAB — CBC
HCT: 50.4 % (ref 39.0–52.0)
Hemoglobin: 16.2 g/dL (ref 13.0–17.0)
MCH: 29.6 pg (ref 26.0–34.0)
MCHC: 32.1 g/dL (ref 30.0–36.0)
MCV: 92 fL (ref 80.0–100.0)
Platelets: 242 10*3/uL (ref 150–400)
RBC: 5.48 MIL/uL (ref 4.22–5.81)
RDW: 14.3 % (ref 11.5–15.5)
WBC: 14 10*3/uL — ABNORMAL HIGH (ref 4.0–10.5)
nRBC: 0.2 % (ref 0.0–0.2)

## 2020-07-07 LAB — MAGNESIUM: Magnesium: 2.2 mg/dL (ref 1.7–2.4)

## 2020-07-07 MED ORDER — FUROSEMIDE 40 MG PO TABS
40.0000 mg | ORAL_TABLET | Freq: Every day | ORAL | Status: DC
  Administered 2020-07-07 – 2020-07-09 (×3): 40 mg via ORAL
  Filled 2020-07-07: qty 1
  Filled 2020-07-07 (×2): qty 2

## 2020-07-07 MED ORDER — POLYETHYLENE GLYCOL 3350 17 G PO PACK
17.0000 g | PACK | Freq: Two times a day (BID) | ORAL | Status: DC
  Administered 2020-07-08 (×2): 17 g via ORAL
  Filled 2020-07-07 (×3): qty 1

## 2020-07-07 MED ORDER — MAGNESIUM CITRATE PO SOLN
1.0000 | Freq: Once | ORAL | Status: AC
  Administered 2020-07-07: 1 via ORAL
  Filled 2020-07-07: qty 296

## 2020-07-07 MED ORDER — SODIUM POLYSTYRENE SULFONATE 15 GM/60ML PO SUSP
45.0000 g | Freq: Once | ORAL | Status: AC
  Administered 2020-07-07: 45 g via ORAL
  Filled 2020-07-07: qty 180

## 2020-07-07 MED ORDER — SODIUM CHLORIDE 0.9 % IV SOLN: INTRAVENOUS | Status: DC

## 2020-07-07 MED ORDER — DILTIAZEM HCL ER COATED BEADS 180 MG PO CP24
180.0000 mg | ORAL_CAPSULE | Freq: Every day | ORAL | Status: DC
  Administered 2020-07-08 – 2020-07-09 (×2): 180 mg via ORAL
  Filled 2020-07-07 (×5): qty 1

## 2020-07-07 MED ORDER — SENNOSIDES-DOCUSATE SODIUM 8.6-50 MG PO TABS
1.0000 | ORAL_TABLET | Freq: Two times a day (BID) | ORAL | Status: DC
  Administered 2020-07-08 (×2): 1 via ORAL
  Filled 2020-07-07 (×3): qty 1

## 2020-07-07 NOTE — Plan of Care (Signed)
Care plane reviewed with pt, need more education about his health .

## 2020-07-07 NOTE — Progress Notes (Signed)
[place] for Heparin Indication: atrial fibrillation  Patient Measurements: Height: 6' (182.9 cm) Weight: 122.9 kg (271 lb) IBW/kg (Calculated) : 77.6 HEPARIN DW (KG): 104.8  Labs: Recent Labs    07/05/20 0359 07/05/20 0359 07/06/20 1118 07/06/20 1819 07/07/20 0137 07/07/20 0801  HGB 14.4  --  15.7  --  16.2  --   HCT 45.9  --  49.0  --  50.4  --   PLT 150  --  256  --  242  --   HEPARINUNFRC  --    < > 0.35 0.27* 0.41 0.43  CREATININE 1.75*  --  1.83*  --  2.00*  --    < > = values in this interval not displayed.   Estimated Creatinine Clearance: 55.2 mL/min (A) (by C-G formula based on SCr of 2 mg/dL (H)).  Medical History: Past Medical History:  Diagnosis Date  . CHF (congestive heart failure) ([place])   . Chronic kidney disease   . COPD (chronic obstructive pulmonary disease) ([place])   . Coronary artery disease   . Depression   . Diabetes mellitus without complication ([place])   . GERD (gastroesophageal reflux disease)   . Gout   . Hypertension   . Influenza A with respiratory manifestations   . Mental disorder    Assessment: 60 y.o. male with medical history significant of diastolic and systolic heart failure, COPD on chronic oxygen, CKD 3, depression, hypertension, hyperlipidemia, gout, diabetes who presents with chest pain and worsening shortness of breath No anticoagulants PTA per med list.  H&H, platelets stable and wnl.  Date Time HL Rate/comment 1/23 1118 0.35 1450 units/hr 1/23 1819 0.27 1450 units/hr 1/24 0137 0.41 1650 units/hr, therapeutic x 1 1/24 0801 0.43 1650 units/hr, therapeutic x 2  Goal of Therapy:  Heparin level 0.3-0.7 units/ml Monitor platelets by anticoagulation protocol: Yes   Plan:   Anti-Xa level therapeutic  Continue heparin infusion at 1650 units/hr  Recheck anti-Xa level tomorrow AM  Daily CBC per protocol while on heparin infusion  Benita Gutter 07/07/2020,8:37 AM

## 2020-07-07 NOTE — Progress Notes (Addendum)
ANTICOAGULATION CONSULT NOTE  Pharmacy Consult for Heparin Indication: atrial fibrillation  No Known Allergies  Patient Measurements: Height: 6' (182.9 cm) Weight: 122.9 kg (271 lb) IBW/kg (Calculated) : 77.6 HEPARIN DW (KG): 104.8  Vital Signs: Temp: 97.8 F (36.6 C) (01/23 2100) Temp Source: Oral (01/23 2100) BP: 123/102 (01/24 0300) Pulse Rate: 116 (01/24 0300)  Labs: Recent Labs    07/05/20 0359 07/06/20 1118 07/06/20 1819 07/07/20 0137  HGB 14.4 15.7  --  16.2  HCT 45.9 49.0  --  50.4  PLT 150 256  --  242  HEPARINUNFRC  --  0.35 0.27* 0.41  CREATININE 1.75* 1.83*  --  2.00*   Estimated Creatinine Clearance: 55.2 mL/min (A) (by C-G formula based on SCr of 2 mg/dL (H)).  Medical History: Past Medical History:  Diagnosis Date  . CHF (congestive heart failure) ([place])   . Chronic kidney disease   . COPD (chronic obstructive pulmonary disease) ([place])   . Coronary artery disease   . Depression   . Diabetes mellitus without complication ([place])   . GERD (gastroesophageal reflux disease)   . Gout   . Hypertension   . Influenza A with respiratory manifestations   . Mental disorder    Medications:  Medications Prior to Admission  Medication Sig Dispense Refill Last Dose  . albuterol (PROVENTIL) (2.5 MG/3ML) 0.083% nebulizer solution Take 3 mLs by nebulization every 6 (six) hours as needed for shortness of breath. 90 mL 1 prn at prn  . albuterol (VENTOLIN HFA) 108 (90 Base) MCG/ACT inhaler Inhale 2 puffs into the lungs every 6 (six) hours as needed for wheezing or shortness of breath. 8.5 g 2 prn at prn  . allopurinol (ZYLOPRIM) 100 MG tablet Take 2 tablets (200 mg total) by mouth daily. 60 tablet 4 unknown at unknown  . atorvastatin (LIPITOR) 40 MG tablet Take 1 tablet (40 mg total) by mouth daily at 6 PM. 30 tablet 2 unknown at unknown  . budesonide-formoterol (SYMBICORT) 160-4.5 MCG/ACT inhaler Inhale 2 puffs into the lungs 2 (two) times daily. 1 each 2 unknown at  unknown  . carvedilol (COREG) 25 MG tablet Take 1 tablet (25 mg total) by mouth 2 (two) times daily with a meal. 60 tablet 4 unknown at unknown  . diclofenac Sodium (VOLTAREN) 1 % GEL Apply 2 g topically 4 (four) times daily. Use on elbow pain 50 g 3 unknown at unknown  . fluticasone (FLONASE) 50 MCG/ACT nasal spray Place 1 spray into both nostrils daily as needed for allergies or rhinitis. 16 g 2 prn at prn  . furosemide (LASIX) 40 MG tablet Take 1.5 tablets (60 mg total) by mouth daily. 45 tablet 2 unknown at unknown  . glipiZIDE (GLUCOTROL) 5 MG tablet Take 0.5 tablets (2.5 mg total) by mouth 2 (two) times daily before a meal. 30 tablet 4 unknown at unknown  . isosorbide-hydrALAZINE (BIDIL) 20-37.5 MG tablet Take 1 tablet by mouth 3 (three) times daily. 90 tablet 4 unknown at unknown  . montelukast (SINGULAIR) 10 MG tablet Take 1 tablet (10 mg total) by mouth at bedtime. 30 tablet 2 unknown at unknown  . predniSONE (DELTASONE) 20 MG tablet Take 2 tabs PO daily x 2 days, then 1.5 tab daily x 2 days then 1 tab x2 days then 1/2 tab x 2 days 10 tablet 0 unknown at unknown  . sacubitril-valsartan (ENTRESTO) 24-26 MG Take 1 tablet by mouth 2 (two) times daily. 60 tablet 4 unknown at unknown  . aspirin 81  MG EC tablet Take 1 tablet (81 mg total) by mouth daily. 30 tablet 2 unknown at unknown    Assessment: 60 y.o. male with medical history significant of diastolic and systolic heart failure, COPD on chronic oxygen, CKD 3, depression, hypertension, hyperlipidemia, gout, diabetes who presents with chest pain and worsening shortness of breath No anticoagulants PTA per med list.  H&H, platelets stable and wnl.  Date Time HL Rate/comment 1/23 1118 0.35 1450 units/hr 1/23 1819 0.27 1450 units/hr 1/24 0137 0.41 1650 units/hr, therapeutic x 1, CBC stable  Goal of Therapy:  Heparin level 0.3-0.7 units/ml Monitor platelets by anticoagulation protocol: Yes   Plan:   anti-Xa level therapeutic  Continue  heparin infusion at 1650 units/hr  recheck anti-Xa level in 6 hours to confirm  Repeat CBC in am  Hart Robinsons A 07/07/2020,4:26 AM

## 2020-07-07 NOTE — Progress Notes (Signed)
Informed pt that he would no longer be going for TEE today. Pt upset that his UDS was positive for cocaine and upset that he was kept NPO for "no reason." Pt requesting to be disconnected from monitoring cables and IV infusions- including heparin and cardizem- in order to briefly wash up. Pt, however, is not washing up and refusing to be reconnected at this time. He yelled at me to leave his room and give him time to decide if he wants to stay or leave. Pt did call downstairs to order both breakfast and lunch trays. As I left the room, he is walking around room barefoot and refusing non skid socks.

## 2020-07-07 NOTE — Patient Outreach (Signed)
Care Coordination  07/07/2020  ALVER LEETE May 22, 1961 [id number]  [name] will reschedule patients appointment due to surgery.    [name], [name], [place]  High Risk Managed Medicaid Team

## 2020-07-07 NOTE — Progress Notes (Signed)
[facility] Encounter Note  Patient: MONTEZ NEUDECKER / Admit Date: 07/03/2020 / Date of Encounter: 07/07/2020, 12:49 PM   Subjective: 1/23.  Patient overall feels relatively the same from yesterday without evidence of chest pain worsening shortness of breath dizziness nausea or diaphoresis.  The patient has had continued mild cough and congestion.  He does have what appears to be acute on chronic obstructive pulmonary disease exacerbation which has driven some hypoxia.  Lungs have been clear although had admitted to due to having lower extremity edema and abdominal edema which is improved after furosemide use.  Glomerular filtration rate continues to be reasonable at 45.  No evidence of myocardial infarction with troponin at 31.  The patient has developed acute atrial flutter with rapid ventricular rate overnight for which she has received a slight amount of metoprolol which is not particularly helped and will need adjustments of medication management for better heart rate control and possible electrical cardioversion to normal rhythm.  We have discussed at length this regimen and potential treatment including electrical cardioversion and all of its risks and benefits.  The patient understands well  1/24.  The patient has had continued atrial flutter with variable heart rate but much better controlled at this time with an average heart rate under 100 bpm.  This is accomplished by diltiazem drip and metoprolol combination.  We will try to change the diltiazem to oral medication at this time to control.  We have discussed at length transesophageal echocardiogram to assess for atrial appendage thrombus as well as electrical cardioversion due to concerns that atrial flutter responds best to cardioversion rather than other antiarrhythmics.  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 for which they were concerned about his  appropriate anesthesia and therefore we will delay procedure till potentially tomorrow  Review of Systems: Positive for: Shortness of breath cough congestion Negative for: Vision change, hearing change, syncope, dizziness, nausea, vomiting,diarrhea, bloody stool, stomach pain, positive for cough, congestion, negative for diaphoresis, urinary frequency, urinary pain,skin lesions, skin rashes Others previously listed  Objective: Telemetry: Atrial flutter with rapid ventricular rate Physical Exam: Blood pressure 134/82, pulse 76, temperature 97.8 F (36.6 C), temperature source Oral, resp. rate (!) 23, height 6' (1.829 m), weight 122.9 kg, SpO2 95 %. Body mass index is 36.75 kg/m. General: Well developed, well nourished, in no acute distress. Head: Normocephalic, atraumatic, sclera non-icteric, no xanthomas, nares are without discharge. Neck: No apparent masses Lungs: Normal respirations with few diffuse wheezes, some rhonchi, no rales , no crackles   Heart: Regular rate and rhythm, normal S1 S2, no murmur, no rub, no gallop, PMI is normal size and placement, carotid upstroke normal without bruit, jugular venous pressure normal Abdomen: Soft, non-tender, non-distended with normoactive bowel sounds. No hepatosplenomegaly. Abdominal aorta is normal size without bruit Extremities: Trace edema, no clubbing, no cyanosis, no ulcers,  Peripheral: 2+ radial, 2+ femoral, 2+ dorsal pedal pulses Neuro: Alert and oriented. Moves all extremities spontaneously. Psych:  Responds to questions appropriately with a normal affect.   Intake/Output Summary (Last 24 hours) at 07/07/2020 1249 Last data filed at 07/07/2020 0731 Gross per 24 hour  Intake 870.25 ml  Output 1500 ml  Net -629.75 ml    Inpatient Medications:  . allopurinol  200 mg Oral Daily  . aspirin EC  81 mg Oral Daily  . atorvastatin  40 mg Oral q1800  . azithromycin  500 mg Oral Daily  . budesonide (  PULMICORT) nebulizer solution  0.5 mg  Nebulization BID  . Chlorhexidine Gluconate Cloth  6 each Topical Daily  . fluticasone  2 spray Each Nare Daily  . furosemide  40 mg Oral Daily  . insulin aspart  0-9 Units Subcutaneous TID WC  . ipratropium  0.5 mg Nebulization TID  . isosorbide-hydrALAZINE  1 tablet Oral TID  . levalbuterol  0.63 mg Nebulization TID  . loratadine  10 mg Oral Daily  . metoprolol tartrate  100 mg Oral BID  . montelukast  10 mg Oral QHS  . pantoprazole  40 mg Oral Q0600  . polyethylene glycol  17 g Oral Daily  . predniSONE  40 mg Oral Q breakfast  . sodium chloride flush  3 mL Intravenous Q12H  . sodium polystyrene  45 g Oral Once   Infusions:  . sodium chloride    . sodium chloride    . diltiazem (CARDIZEM) infusion 10 mg/hr (07/07/20 0731)  . heparin 1,650 Units/hr (07/07/20 0731)    Labs: Recent Labs    07/06/20 1118 07/07/20 0137  NA 136 136  K 4.0 5.4*  CL 102 99  CO2 25 24  GLUCOSE 158* 119*  BUN 43* 53*  CREATININE 1.83* 2.00*  CALCIUM 8.4* 9.0  MG 2.0 2.2   No results for input(s): AST, ALT, ALKPHOS, BILITOT, PROT, ALBUMIN in the last 72 hours. Recent Labs    07/05/20 0359 07/06/20 1118 07/07/20 0137  WBC 16.4* 15.1* 14.0*  NEUTROABS 13.8* 10.2*  --   HGB 14.4 15.7 16.2  HCT 45.9 49.0 50.4  MCV 94.4 91.8 92.0  PLT 150 256 242   No results for input(s): CKTOTAL, CKMB, TROPONINI in the last 72 hours. Invalid input(s): POCBNP Recent Labs    07/05/20 0359  HGBA1C 6.4*     Weights: [employer]   07/03/20 2031  Weight: 122.9 kg     Radiology/Studies:  DG Chest 1 View  Result Date: 06/16/2020 CLINICAL DATA:  Chest pain and short of breath EXAM: CHEST  1 VIEW COMPARISON:  05/15/2020 FINDINGS: Cardiac enlargement without heart failure or edema. Hypoventilation with bibasilar atelectasis/infiltrate. No significant effusion. IMPRESSION: Hypoventilation with bibasilar atelectasis/infiltrate. Electronically Signed   By: Marlan Palau M.D.   On: 06/16/2020 07:54   DG  Chest 2 View  Result Date: 06/26/2020 CLINICAL DATA:  Shortness of breath. EXAM: CHEST - 2 VIEW COMPARISON:  06/16/2020 FINDINGS: The cardio pericardial silhouette is enlarged. There is pulmonary vascular congestion without overt pulmonary edema. Diffuse bilateral interstitial opacity suggest pulmonary edema. No substantial pleural effusion. The visualized bony structures of the thorax show no acute abnormality. IMPRESSION: Cardiomegaly with vascular congestion and probable interstitial edema. Electronically Signed   By: Kennith Center M.D.   On: 06/26/2020 15:44   DG Chest Portable 1 View  Result Date: 07/03/2020 CLINICAL DATA:  Shortness of breath EXAM: PORTABLE CHEST 1 VIEW COMPARISON:  06/26/2020 FINDINGS: Cardiac shadow is stable. Aortic calcifications are again seen. Lungs are well aerated bilaterally. Mild vascular congestion is again seen. Bibasilar atelectatic changes are noted. Artifact is noted overlying the anterior chest. No other focal abnormality is seen. IMPRESSION: Bibasilar atelectasis. Electronically Signed   By: Alcide Clever M.D.   On: 07/03/2020 20:44   ECHOCARDIOGRAM COMPLETE  Result Date: 06/17/2020    ECHOCARDIOGRAM REPORT   Patient Name:   TYREON DOOLING Date of Exam: 06/17/2020 Medical Rec #:  [id number]       Height:       72.0 in  Accession #:    [id number]      Weight:       264.4 lb Date of Birth:  1960/09/14       BSA:          2.399 m Patient Age:    59 years        BP:           111/80 mmHg Patient Gender: M               HR:           71 bpm. Exam Location:  Inpatient Procedure: 2D Echo, Cardiac Doppler and Color Doppler Indications:    CHF  History:        Patient has prior history of Echocardiogram examinations, most                 recent 12/25/2019. CHF, CAD, COPD, Signs/Symptoms:Chest Pain;                 Risk Factors:Hypertension, Diabetes, Obesity and Current Smoker.  Sonographer:    Lavenia Atlas Referring Phys: [id number] Cecille Po MELVIN IMPRESSIONS  1. Left  ventricular ejection fraction, by estimation, is 20 to 25%. The left ventricle has severely decreased function. The left ventricle demonstrates global hypokinesis. The left ventricular internal cavity size was mildly to moderately dilated. There  is mild concentric left ventricular hypertrophy. Left ventricular diastolic parameters are consistent with Grade III diastolic dysfunction (restrictive). Elevated left atrial pressure.  2. Right ventricular systolic function is moderately reduced. The right ventricular size is moderately enlarged. There is mildly elevated pulmonary artery systolic pressure. The estimated right ventricular systolic pressure is 42.5 mmHg.  3. Left atrial size was mildly dilated.  4. Right atrial size was mildly dilated.  5. The mitral valve is grossly normal. Mild mitral valve regurgitation. No evidence of mitral stenosis.  6. The aortic valve is tricuspid. Aortic valve regurgitation is not visualized. No aortic stenosis is present.  7. The inferior vena cava is dilated in size with <50% respiratory variability, suggesting right atrial pressure of 15 mmHg. Comparison(s): Changes from prior study are noted. EF reduced to 20-25%. Moderately dilated RV with moderately reduced function. Grade 3 DD with elevated RVSP. FINDINGS  Left Ventricle: Left ventricular ejection fraction, by estimation, is 20 to 25%. The left ventricle has severely decreased function. The left ventricle demonstrates global hypokinesis. The left ventricular internal cavity size was mildly to moderately dilated. There is mild concentric left ventricular hypertrophy. Left ventricular diastolic parameters are consistent with Grade III diastolic dysfunction (restrictive). Elevated left atrial pressure. Right Ventricle: The right ventricular size is moderately enlarged. No increase in right ventricular wall thickness. Right ventricular systolic function is moderately reduced. There is mildly elevated pulmonary artery systolic  pressure. The tricuspid regurgitant velocity is 2.62 m/s, and with an assumed right atrial pressure of 15 mmHg, the estimated right ventricular systolic pressure is 42.5 mmHg. Left Atrium: Left atrial size was mildly dilated. Right Atrium: Right atrial size was mildly dilated. Pericardium: Trivial pericardial effusion is present. Mitral Valve: The mitral valve is grossly normal. Mild mitral valve regurgitation. No evidence of mitral valve stenosis. Tricuspid Valve: The tricuspid valve is grossly normal. Tricuspid valve regurgitation is trivial. No evidence of tricuspid stenosis. Aortic Valve: The aortic valve is tricuspid. Aortic valve regurgitation is not visualized. No aortic stenosis is present. Pulmonic Valve: The pulmonic valve was grossly normal. Pulmonic valve regurgitation is not visualized. No evidence of pulmonic stenosis. Aorta: The  aortic root is normal in size and structure. Venous: The inferior vena cava is dilated in size with less than 50% respiratory variability, suggesting right atrial pressure of 15 mmHg. IAS/Shunts: The atrial septum is grossly normal.  LEFT VENTRICLE PLAX 2D LVIDd:         6.50 cm  Diastology LVIDs:         5.70 cm  LV e' medial:    3.05 cm/s LV PW:         1.50 cm  LV E/e' medial:  29.6 LV IVS:        1.30 cm  LV e' lateral:   4.24 cm/s LVOT diam:     3.00 cm  LV E/e' lateral: 21.3 LV SV:         115 LV SV Index:   48 LVOT Area:     7.07 cm  RIGHT VENTRICLE RV Basal diam:  4.80 cm RV S prime:     14.60 cm/s TAPSE (M-mode): 2.1 cm LEFT ATRIUM              Index       RIGHT ATRIUM           Index LA diam:        4.60 cm  1.92 cm/m  RA Area:     35.10 cm LA Vol (A2C):   103.0 ml 42.94 ml/m RA Volume:   149.00 ml 62.11 ml/m LA Vol (A4C):   86.8 ml  36.18 ml/m LA Biplane Vol: 99.1 ml  41.31 ml/m  AORTIC VALVE LVOT Vmax:   97.50 cm/s LVOT Vmean:  60.600 cm/s LVOT VTI:    0.163 m  AORTA Ao Root diam: 3.80 cm MITRAL VALVE               TRICUSPID VALVE MV Area (PHT): 3.42 cm     TR Peak grad:   27.5 mmHg MV Decel Time: 222 msec    TR Vmax:        262.00 cm/s MV E velocity: 90.40 cm/s MV A velocity: 31.70 cm/s  SHUNTS MV E/A ratio:  2.85        Systemic VTI:  0.16 m                            Systemic Diam: 3.00 cm Lennie Odor MD Electronically signed by Lennie Odor MD Signature Date/Time: 06/17/2020/4:57:34 PM    Final      Assessment and Recommendation  60 y.o. male with known dilated cardiomyopathy with acute on chronic systolic dysfunction congestive heart failure secondary to cough and congestion and COPD with hypertension hyperlipidemia without evidence of myocardial infarction but new onset atrial flutter with rapid ventricular rate needing further treatment options 1.  Begin metoprolol and increased dose to much higher dose to control heart rate to below 100 bpm and possible spontaneous conversion to normal sinus rhythm.  Will add diltiazem orally and possibly discontinue intravenous diltiazem if able.   2.  If patient does not spontaneously convert to normal sinus rhythm we will plan on transesophageal echocardiogram with electrical cardioversion tomorrow for this issue.  The patient understands all risk and benefits of this procedure and agrees 3.  Anticoagulation with heparin for further risk reduction and stroke with atrial fibrillation and atrial flutter 4.  Discontinuation of Entresto and potentially hydralazine if necessary for better ability to treat heart rate control 5.  Further treatment options after above  Signed, Cleone Hulick  Gwen Pounds M.D. FACC

## 2020-07-07 NOTE — Progress Notes (Signed)
PROGRESS NOTE    Chris [name]  [id number] DOB: [birth year] DOA: 07/03/2020 PCP: [name], MD    Chief Complaint  Patient presents with  . Shortness of Breath    Brief Narrative:  HPI per Dr. Ninetta Lights is a 60 y.o. male with medical history significant for diastolic and systolic heart failure, COPD on chronic oxygen, CKD 3, depression, hypertension, hyperlipidemia, gout, diabetes who presents with chest pain and worsening shortness of breat. Per chart in the [facility] sat was in the 70s and pt was switched over to CPAP. Patient had note has interim history of recent admission 06/16/2020-06/18/2020 at which time patient was diagnosed with Acute CHF exacerbation with acute hypoxic respiratory , patient was unfortunately at that time discharged AMA. He then presented to ED on 1/13 with sob/wheezing from jail he was found to have acute CHF he was treated in ed and lasix home dosage was increased and he was discharged to outpatient care with a course of steroids as well.   He now returns with similar complaint of acute onset of sob, wheezing, chest tightness x for less than one day.He also has associated complaints of cough, as well as tenderness of abdomen s/p coughing episodes. He notes no f/c/n/v/d/.   ED Course:  98.3, bp 134/88, hr 79, rr 19 , sat 98% on bipap  Wbc: 15.3,hgb 15.9, pmn 11.4, mag 2  Na 140, K 4.5, cl 101, glu 42, cr 2.16 at around base ce 31 [id number] less than prior 2000's UA :neg Vbg: 7.37/59 covid resp panel:neg  tx solumedrol, douneb Lasix 40mg   lactic 2  procal 0.15 tx , ctx/ azithromycin  ekg nsr lvh, with ant/lateral nonspecific twave findings  Assessment & Plan:   Principal Problem:   Acute and chronic respiratory failure (acute-on-chronic) (HCC) Active Problems:   Chronic systolic heart failure (HCC)   Mixed hyperlipidemia   Essential hypertension   CKD (chronic kidney disease) stage 3, GFR 30-59 ml/min (HCC)   Tobacco  dependence   History of gout   Acute on chronic combined systolic and diastolic CHF (congestive heart failure) (HCC)   Type 2 diabetes mellitus with stage 3 chronic kidney disease (HCC)   COPD with acute exacerbation (HCC)   Acute respiratory failure with hypoxia (HCC)   Typical atrial flutter (HCC)  1 acute respiratory failure with hypoxia secondary to acute on chronic combined systolic and diastolic heart failure exacerbation and possible acute COPD exacerbation Patient presented with worsening shortness of breath, noted to be hypoxic with sats in the 70s, complaining of chest tightness, noted to have crackles on examination.   Patient with prior 2D echo with EF of 20 to 25% with global hypokinesis.  Cardiac enzymes elevated but flattened.  2D echo not repeated as one recently done.   Patient on Lasix 40 mg IV every 12 hours with a urine output of 3.5 L over the past 24 hours. Weights not recorded. Patient with clinical improvement. Patient was on BiPAP on admission currently off BiPAP and on room air. Continue current dose of IV Lasix 40 mg every 12 hours. Cardiac medications adjusted due to onset of atrial flutter and patient now on Lopressor 100 mg twice daily, BiDil, aspirin, statin.  Entresto discontinued per cardiology.  Transition from IV Lasix to oral Lasix 40 mg daily.  Continue bronchodilators, steroids, azithromycin, CPAP nightly. Cardiology following recommending continued current medical management with close outpatient follow-up with cardiology.  2.  New onset atrial flutter Patient noted  to go into atrial flutter the evening of 07/05/2020, requiring transfer to the stepdown unit.  Patient started on Cardizem drip per cardiology in addition to Lopressor 100 mg twice daily.  Patient on heparin drip for anticoagulation per cardiology.  Patient wants to have TEE with cardioversion today however due to concerns for cocaine in his system has been postponed until tomorrow if patient  remains in atrial flutter per cardiology.  Currently on Cardizem drip to be transition to oral Cardizem.  On Lopressor.  On IV heparin.  Per cardiology.   3.  Mild acute exacerbation of COPD Patient presenting with shortness of breath with chest tightness, some crackles, concern for possible COPD exacerbation.  Patient with clinical improvement with chest tightness. Patient also on IV diuretics. Patient given a dose of IV Solu-Medrol currently on prednisone 40 mg daily which we will continue. Continue scheduled DuoNebs, Claritin, PPI, Flonase, Pulmicort nebs, azithromycin. Incentive spirometry. Flutter valve. Tobacco cessation stressed to patient. Outpatient follow-up.  4.  Elevated lactic acid level/leukocytosis Questionable etiology.  Patient currently afebrile.  Urinalysis unremarkable.  Blood cultures ordered and pending with no growth to date.  On empiric azithromycin.  Patient also on steroids.  5.  Chronic kidney disease stage IIIb Baseline creatinine approximately 1.9-2. Creatinine currently at 2.0.  Change IV Lasix to oral Lasix.  Follow.   6.  Hypertension Blood pressure improved.  Patient however in atrial flutter currently on metoprolol and Cardizem drip.  Patient being transitioned to oral Cardizem per cardiology.  Continue BiDil.  Delene Loll has been discontinued.  Follow.   7.  Hyperlipidemia Continue statin.  8.  OSA CPAP nightly  9.  GERD PPI.  10.  History of gout Allopurinol.    11.  Polysubstance abuse Cessation stressed to patient.  TOC consult.  12.  Constipation Patient states no bowel movement despite MiraLAX and sorbitol.  Increase MiraLAX to twice daily.  Placed on Senokot-S twice daily.  Mag citrate p.o. x1.  13.  Hyperkalemia Kayexalate 45 g p.o. x1.    DVT prophylaxis: Heparin Code Status: Full Family Communication: Updated patient.  No family at bedside. Disposition:   Status is: Inpatient    Dispo: The patient is from: Home               Anticipated d/c is to: Home              Anticipated d/c date is: 3-4 days              Patient currently on, IV heparin, IV Cardizem drip not stable for discharge.       Consultants:   Cardiology: Dr Nehemiah Massed 07/04/2020  Procedures:   Chest x-ray 07/03/2020  Antimicrobials:   Oral azithromycin 07/04/2020>>> 07/08/2020  IV azithromycin 1/20 /2022x1 dose  IV Rocephin 1/20/ 2022x1 dose   Subjective: Sitting up eating breakfast. States cardioversion cancelled this morning. C/o constipstion. No SOB. No CP. States had episode of hypotension last night.  Patient states no bowel movement yesterday despite sorbitol and MiraLAX.  Objective: Vitals:   07/07/20 0300 07/07/20 0600 07/07/20 0700 07/07/20 0704  BP: (!) 123/102 113/87 134/82   Pulse: (!) 116 67 (!) 36 76  Resp: (!) 29 (!) 24 (!) 25 (!) 23  Temp:      TempSrc:      SpO2: 99% 97% 98% 95%  Weight:      Height:        Intake/Output Summary (Last 24 hours) at 07/07/2020 1112 Last data filed  at 07/07/2020 0731 Gross per 24 hour  Intake 870.25 ml  Output 3500 ml  Net -2629.75 ml   Filed Weights   07/03/20 2031  Weight: 122.9 kg    Examination:  General exam: NAD Respiratory system: Lungs clear to auscultation bilaterally.  No wheezes, no crackles, no rhonchi.  Normal respiratory effort.  Cardiovascular system: Irregularly irregular.  No JVD.  No murmurs rubs or gallops.  No lower extremity edema.   Gastrointestinal system: Abdomen is soft, nontender, nondistended, positive bowel sounds.  No rebound.  No guarding.  Central nervous system: Alert and oriented. No focal neurological deficits. Extremities: Symmetric 5 x 5 power. Skin: No rashes, lesions or ulcers Psychiatry: Judgement and insight appear normal. Mood & affect appropriate.     Data Reviewed: I have personally reviewed following labs and imaging studies  CBC: Recent Labs  Lab 07/03/20 2031 07/04/20 0246 07/05/20 0359 07/06/20 1118 07/07/20 0137   WBC 15.3* 13.4* 16.4* 15.1* 14.0*  NEUTROABS 11.4*  --  13.8* 10.2*  --   HGB 15.9 14.9 14.4 15.7 16.2  HCT 49.8 45.3 45.9 49.0 50.4  MCV 92.7 91.7 94.4 91.8 92.0  PLT 234 217 150 256 [id number]    Basic Metabolic Panel: Recent Labs  Lab 07/03/20 2031 07/04/20 0246 07/05/20 0359 07/06/20 1118 07/07/20 0137  NA 140  --  138 136 136  K 4.5  --  4.6 4.0 5.4*  CL 101  --  99 102 99  CO2 28  --  25 25 24   GLUCOSE 42*  --  163* 158* 119*  BUN 25*  --  40* 43* 53*  CREATININE 2.16* 2.00* 1.75* 1.83* 2.00*  CALCIUM 8.8*  --  8.6* 8.4* 9.0  MG 2.0  --  2.1 2.0 2.2    GFR: Estimated Creatinine Clearance: 55.2 mL/min (A) (by C-G formula based on SCr of 2 mg/dL (H)).  Liver Function Tests: Recent Labs  Lab 07/03/20 2031  AST 15  ALT 20  ALKPHOS 77  BILITOT 0.7  PROT 7.1  ALBUMIN 3.4*    CBG: Recent Labs  Lab 07/05/20 1615 07/05/20 2211 07/06/20 0657 07/06/20 1118 07/06/20 1528  GLUCAP 161* 152* 98 140* 128*     Recent Results (from the past 240 hour(s))  SARS Coronavirus 2 by RT PCR (hospital order, performed in [facility] hospital lab) Nasopharyngeal Nasopharyngeal Swab     Status: None   Collection Time: 07/03/20  8:34 PM   Specimen: Nasopharyngeal Swab  Result Value Ref Range Status   SARS Coronavirus 2 NEGATIVE NEGATIVE Final    Comment: (NOTE) SARS-CoV-2 target nucleic acids are NOT DETECTED.  The SARS-CoV-2 RNA is generally detectable in upper and lower respiratory specimens during the acute phase of infection. The lowest concentration of SARS-CoV-2 viral copies this assay can detect is 250 copies / mL. A negative result does not preclude SARS-CoV-2 infection and should not be used as the sole basis for treatment or other patient management decisions.  A negative result may occur with improper specimen collection / handling, submission of specimen other than nasopharyngeal swab, presence of viral mutation(s) within the areas targeted by this assay, and  inadequate number of viral copies (<250 copies / mL). A negative result must be combined with clinical observations, patient history, and epidemiological information.  Fact Sheet for Patients:   StrictlyIdeas.no  Fact Sheet for Healthcare Providers: BankingDealers.co.za  This test is not yet approved or  cleared by the [place] FDA and has been authorized for  detection and/or diagnosis of SARS-CoV-2 by FDA under an Emergency Use Authorization (EUA).  This EUA will remain in effect (meaning this test can be used) for the duration of the COVID-19 declaration under Section 564(b)(1) of the Act, 21 U.S.C. section 360bbb-3(b)(1), unless the authorization is terminated or revoked sooner.  Performed at [facility], [address] [place], [place] [address]   Culture, blood (routine x 2)     Status: None (Preliminary result)   Collection Time: 07/03/20 10:45 PM   Specimen: BLOOD  Result Value Ref Range Status   Specimen Description BLOOD RIGHT ASSIST CONTROL  Final   Special Requests   Final    BOTTLES DRAWN AEROBIC AND ANAEROBIC Blood Culture adequate volume   Culture   Final    NO GROWTH 4 DAYS Performed at [facility], [address] [place], [place] [address]    Report Status PENDING  Incomplete  Culture, blood (routine x 2)     Status: None (Preliminary result)   Collection Time: 07/03/20 10:45 PM   Specimen: BLOOD  Result Value Ref Range Status   Specimen Description BLOOD LEFT HAND  Final   Special Requests   Final    BOTTLES DRAWN AEROBIC AND ANAEROBIC Blood Culture adequate volume   Culture   Final    NO GROWTH 4 DAYS Performed at [facility], [address], [place], [place] [address]    Report Status PENDING  Incomplete  MRSA PCR Screening     Status: None   Collection Time: 07/06/20  7:00 AM   Specimen: Nasopharyngeal  Result Value Ref Range Status   MRSA by PCR NEGATIVE NEGATIVE  Final    Comment:        The GeneXpert MRSA Assay (FDA approved for NASAL specimens only), is one component of a comprehensive MRSA colonization surveillance program. It is not intended to diagnose MRSA infection nor to guide or monitor treatment for MRSA infections. Performed at [facility], [address], [place], [place] [address]          Radiology Studies: No results found.      Scheduled Meds: . allopurinol  200 mg Oral Daily  . aspirin EC  81 mg Oral Daily  . atorvastatin  40 mg Oral q1800  . azithromycin  500 mg Oral Daily  . budesonide (PULMICORT) nebulizer solution  0.5 mg Nebulization BID  . Chlorhexidine Gluconate Cloth  6 each Topical Daily  . fluticasone  2 spray Each Nare Daily  . furosemide  40 mg Oral Daily  . insulin aspart  0-9 Units Subcutaneous TID WC  . ipratropium  0.5 mg Nebulization TID  . isosorbide-hydrALAZINE  1 tablet Oral TID  . levalbuterol  0.63 mg Nebulization TID  . loratadine  10 mg Oral Daily  . metoprolol tartrate  100 mg Oral BID  . montelukast  10 mg Oral QHS  . pantoprazole  40 mg Oral Q0600  . polyethylene glycol  17 g Oral Daily  . predniSONE  40 mg Oral Q breakfast  . sodium chloride flush  3 mL Intravenous Q12H  . sodium polystyrene  45 g Oral Once   Continuous Infusions: . sodium chloride    . sodium chloride    . diltiazem (CARDIZEM) infusion 10 mg/hr (07/07/20 0731)  . heparin 1,650 Units/hr (07/07/20 0731)     LOS: 3 days    Time spent: 40 minutes    Irine Seal, MD [facility]   To contact the  attending provider between 7A-7P or the covering provider during after hours 7P-7A, please log into the web site [email or web address] and access using universal [place] password for that web site. If you do not have the password, please call the hospital operator.  07/07/2020, 11:12 AM

## 2020-07-08 ENCOUNTER — Inpatient Hospital Stay: Payer: Medicaid Other | Admitting: Anesthesiology

## 2020-07-08 ENCOUNTER — Encounter: Payer: Self-pay | Admitting: Internal Medicine

## 2020-07-08 ENCOUNTER — Inpatient Hospital Stay
Admit: 2020-07-08 | Discharge: 2020-07-08 | Disposition: A | Discharge status: Home / Self Care | Payer: Medicaid Other | Attending: Internal Medicine | Admitting: Internal Medicine

## 2020-07-08 ENCOUNTER — Encounter
Admission: EM | Disposition: A | Discharge status: Home / Self Care | Payer: Self-pay | Source: Home / Self Care | Attending: Internal Medicine

## 2020-07-08 DIAGNOSIS — J441 Chronic obstructive pulmonary disease with (acute) exacerbation: Secondary | ICD-10-CM

## 2020-07-08 DIAGNOSIS — I4892 Unspecified atrial flutter: Secondary | ICD-10-CM

## 2020-07-08 HISTORY — PX: CARDIOVERSION: SHX1299

## 2020-07-08 HISTORY — PX: TEE WITHOUT CARDIOVERSION: SHX5443

## 2020-07-08 LAB — CBC
HCT: 45.8 % (ref 39.0–52.0)
Hemoglobin: 14.6 g/dL (ref 13.0–17.0)
MCH: 29.3 pg (ref 26.0–34.0)
MCHC: 31.9 g/dL (ref 30.0–36.0)
MCV: 91.8 fL (ref 80.0–100.0)
Platelets: 255 10*3/uL (ref 150–400)
RBC: 4.99 MIL/uL (ref 4.22–5.81)
RDW: 14.3 % (ref 11.5–15.5)
WBC: 17.6 10*3/uL — ABNORMAL HIGH (ref 4.0–10.5)
nRBC: 0 % (ref 0.0–0.2)

## 2020-07-08 LAB — CULTURE, BLOOD (ROUTINE X 2)
Culture: NO GROWTH
Culture: NO GROWTH
Special Requests: ADEQUATE
Special Requests: ADEQUATE

## 2020-07-08 LAB — URINE DRUG SCREEN, QUALITATIVE (ARMC ONLY)
Amphetamines, Ur Screen: NOT DETECTED
Barbiturates, Ur Screen: NOT DETECTED
Benzodiazepine, Ur Scrn: NOT DETECTED
Cannabinoid 50 Ng, Ur ~~LOC~~: NOT DETECTED
Cocaine Metabolite,Ur ~~LOC~~: NOT DETECTED
MDMA (Ecstasy)Ur Screen: NOT DETECTED
Methadone Scn, Ur: NOT DETECTED
Opiate, Ur Screen: NOT DETECTED
Phencyclidine (PCP) Ur S: NOT DETECTED
Tricyclic, Ur Screen: NOT DETECTED

## 2020-07-08 LAB — GLUCOSE, CAPILLARY
Glucose-Capillary: 104 mg/dL — ABNORMAL HIGH (ref 70–99)
Glucose-Capillary: 84 mg/dL (ref 70–99)
Glucose-Capillary: 120 mg/dL — ABNORMAL HIGH (ref 70–99)
Glucose-Capillary: 139 mg/dL — ABNORMAL HIGH (ref 70–99)

## 2020-07-08 LAB — MAGNESIUM: Magnesium: 2.4 mg/dL (ref 1.7–2.4)

## 2020-07-08 LAB — BASIC METABOLIC PANEL
Anion gap: 12 (ref 5–15)
BUN: 56 mg/dL — ABNORMAL HIGH (ref 6–20)
CO2: 28 mmol/L (ref 22–32)
Calcium: 8.9 mg/dL (ref 8.9–10.3)
Chloride: 96 mmol/L — ABNORMAL LOW (ref 98–111)
Creatinine, Ser: 1.83 mg/dL — ABNORMAL HIGH (ref 0.61–1.24)
GFR, Estimated: 43 mL/min — ABNORMAL LOW (ref >60)
Glucose, Bld: 117 mg/dL — ABNORMAL HIGH (ref 70–99)
Potassium: 4.7 mmol/L (ref 3.5–5.1)
Sodium: 136 mmol/L (ref 135–145)

## 2020-07-08 LAB — HEPARIN LEVEL (UNFRACTIONATED)
Heparin Unfractionated: 0.29 IU/mL — ABNORMAL LOW (ref 0.30–0.70)
Heparin Unfractionated: <0.1 IU/mL — ABNORMAL LOW (ref 0.30–0.70)

## 2020-07-08 SURGERY — ECHOCARDIOGRAM, TRANSESOPHAGEAL
Anesthesia: General

## 2020-07-08 MED ORDER — MIDAZOLAM HCL 2 MG/2ML IJ SOLN
INTRAMUSCULAR | Status: AC
  Filled 2020-07-08: qty 2

## 2020-07-08 MED ORDER — HEPARIN (PORCINE) 25000 UT/250ML-% IV SOLN
1850.0000 [IU]/h | INTRAVENOUS | Status: DC
  Administered 2020-07-08: 1850 [IU]/h via INTRAVENOUS

## 2020-07-08 MED ORDER — MIDAZOLAM HCL 2 MG/2ML IJ SOLN
INTRAMUSCULAR | Status: DC | PRN
  Administered 2020-07-08: 2 mg via INTRAVENOUS

## 2020-07-08 MED ORDER — SODIUM CHLORIDE FLUSH 0.9 % IV SOLN
INTRAVENOUS | Status: AC
  Administered 2020-07-08: 10 mL
  Filled 2020-07-08: qty 10

## 2020-07-08 MED ORDER — PROPOFOL 10 MG/ML IV BOLUS
INTRAVENOUS | Status: DC | PRN
  Administered 2020-07-08: 20 mg via INTRAVENOUS
  Administered 2020-07-08: 50 mg via INTRAVENOUS

## 2020-07-08 MED ORDER — HEPARIN BOLUS VIA INFUSION
1500.0000 [IU] | Freq: Once | INTRAVENOUS | Status: AC
  Administered 2020-07-08: 1500 [IU] via INTRAVENOUS
  Filled 2020-07-08: qty 1500

## 2020-07-08 MED ORDER — LIDOCAINE VISCOUS HCL 2 % MT SOLN
OROMUCOSAL | Status: AC
  Filled 2020-07-08: qty 15

## 2020-07-08 MED ORDER — APIXABAN 5 MG PO TABS
5.0000 mg | ORAL_TABLET | Freq: Two times a day (BID) | ORAL | Status: DC
  Administered 2020-07-08 – 2020-07-09 (×3): 5 mg via ORAL
  Filled 2020-07-08 (×3): qty 1

## 2020-07-08 MED ORDER — BUTAMBEN-TETRACAINE-BENZOCAINE 2-2-14 % EX AERO
INHALATION_SPRAY | CUTANEOUS | Status: AC
  Filled 2020-07-08: qty 5

## 2020-07-08 NOTE — Progress Notes (Signed)
Nurse in to give medications.Patient argumentative about missing meal and requesting 2 [place] trays and 2 cereals with 3 cans ginger ale.  Reports he has also placed order for pizza to be delivered.  Informed of heart healthy diet with carb restrictions.  Informed that pizza not considered healthy per his ordered diet.  Pt agreed to cancel pizza order and have [place] tray with graham crackers and peanut butter.  Pt asking if he will be discharged tomorrow.  I informed him that discharge is possible considering his improved condition.  Pt yelling that previous "white nurse" informed him that he would definitely go home tomorrow. I continued to stress that discharge is possible if he has a place to go on discharge considering his conversation with Social worker earlier regarding needing assistance with place to stay.  Pt became upset and yelling that he was leaving hospital now because I was in cahoots with the white people. Questioning how I had information about his conversation with the social worker from"downstairs'.  Informed that report was given at the bedside earlier regarding his plan of care. Pt finally agreed to take medications after education of each med. Requested this nurse to turn off lights and close door on leaving.

## 2020-07-08 NOTE — Progress Notes (Signed)
Patient transferred to 2A form ICU.  Oriented to room.  Resting without complaints at this time.

## 2020-07-08 NOTE — Procedures (Signed)
Transesophageal echocardiogram preliminary report  Chris [name] [id number] [birth year]  Preliminary diagnosis  atrial flutter with need incrdioversion  Postprocedural diagnosis atrial flutter with spontaneous contrast of atrium but no thrombus and severe lv dysfunctin  Time out A timeout was performed by the nursing staff and physicians specifically identifying the procedure performed, identification of the patient, the type of sedation, all allergies and medications, all pertinent medical history, and presedation assessment of nasopharynx. The patient and or family understand the risks of the procedure including the rare risks of death, stroke, heart attack, esophogeal perforation, sore throat, and reaction to medications given.  Moderate sedation During this procedure the patient has received  As per anesthesia  to achieve appropriate moderate sedation.  The patient had continued monitoring of heart rate, oxygenation, blood pressure, respiratory rate, and extent of signs of sedation throughout the entire procedure.  The patient received this moderate sedation over a period of 14 minutes.  Both the nursing staff and I were present during the procedure when the patient had moderate sedation for 100% of the time.  Treatment considerations  Electrical cardioversion of atrial fibrillation due to no evidence of atrial appendage thrombus  For further details of transesophageal echocardiogram please refer to final report.  Signed,  Corey Skains M.D. [facility] 07/08/2020 8:20 AM

## 2020-07-08 NOTE — Transfer of Care (Signed)
Immediate Anesthesia Transfer of Care Note  Patient: Chris [name]  Procedure(s) Performed: TRANSESOPHAGEAL ECHOCARDIOGRAM (TEE) (N/A ) CARDIOVERSION (N/A )  Patient Location: PACU  Anesthesia Type:General  Level of Consciousness: awake, alert  and oriented  Airway & Oxygen Therapy: Patient Spontanous Breathing and Patient connected to face mask oxygen  Post-op Assessment: Report given to RN and Post -op Vital signs reviewed and stable  Post vital signs: Reviewed and stable  Last Vitals:  Vitals Value Taken Time  BP 107/96 07/08/20 0824  Temp    Pulse 76 07/08/20 0830  Resp 20 07/08/20 0830  SpO2 99 % 07/08/20 0830  Vitals shown include unvalidated device data.  Last Pain:  Vitals:   07/08/20 0200  TempSrc: Oral  PainSc: 0-No pain      Patients Stated Pain Goal: 0 (71/21/97 5883)  Complications: No complications documented.

## 2020-07-08 NOTE — CV Procedure (Signed)
Electrical Cardioversion Procedure Note Chris [name] [id number] [birth year]  Procedure: Electrical Cardioversion Indications:  paroxysmal non valvular atrial flutter  Procedure Details Consent: Risks of procedure as well as the alternatives and risks of each were explained to the (patient/caregiver).  Consent for procedure obtained. Time Out: Verified patient identification, verified procedure, site/side was marked, verified correct patient position, special equipment/implants available, medications/allergies/relevent history reviewed, required imaging and test results available.  Performed  Patient placed on cardiac monitor, pulse oximetry, supplemental oxygen as necessary.  Sedation given: Propofol and versed as per anesthesia  Pacer pads placed anterior and posterior chest.  Cardioverted 1 time(s).  Cardioverted at 120J.  Evaluation Findings: Post procedure EKG shows: NSR Complications: None Patient did tolerate procedure well.   Chris [name] M.D. [facility] 07/08/2020, 8:22 AM
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
## 2020-07-08 NOTE — Anesthesia Preprocedure Evaluation (Signed)
Anesthesia Evaluation  Patient identified by MRN, date of birth, ID band Patient awake    Reviewed: Allergy & Precautions, H&P , NPO status , Patient's Chart, lab work & pertinent test results  History of Anesthesia Complications Negative for: history of anesthetic complications  Airway Mallampati: III  TM Distance: >3 FB Neck ROM: full    Dental  (+) Chipped, Poor Dentition, Missing   Pulmonary sleep apnea , pneumonia, COPD,  COPD inhaler and oxygen dependent, Current Smoker,    Pulmonary exam normal        Cardiovascular hypertension, (-) angina+ CAD and +CHF  + dysrhythmias Atrial Fibrillation  Rhythm:irregular     Neuro/Psych PSYCHIATRIC DISORDERS negative neurological ROS     GI/Hepatic GERD  Medicated and Controlled,(+)     substance abuse  ,   Endo/Other  diabetes, Type 2  Renal/GU CRFRenal disease  negative genitourinary   Musculoskeletal   Abdominal   Peds  Hematology negative hematology ROS (+)   Anesthesia Other Findings Past Medical History: No date: CHF (congestive heart failure) (HCC) No date: Chronic kidney disease No date: COPD (chronic obstructive pulmonary disease) (HCC) No date: Coronary artery disease No date: Depression No date: Diabetes mellitus without complication (HCC) No date: GERD (gastroesophageal reflux disease) No date: Gout No date: Hypertension No date: Influenza A with respiratory manifestations No date: Mental disorder  Past Surgical History: No date: ANKLE SURGERY No date: CARDIAC CATHETERIZATION No date: HERNIA REPAIR     Comment:  x2 No date: SHOULDER SURGERY  BMI    Body Mass Index: 35.70 kg/m      Reproductive/Obstetrics negative OB ROS                             Anesthesia Physical Anesthesia Plan  ASA: IV  Anesthesia Plan: General   Post-op Pain Management:    Induction: Intravenous  PONV Risk Score and Plan: Propofol  infusion and TIVA  Airway Management Planned: Natural Airway and Nasal Cannula  Additional Equipment:   Intra-op Plan:   Post-operative Plan:   Informed Consent: I have reviewed the patients History and Physical, chart, labs and discussed the procedure including the risks, benefits and alternatives for the proposed anesthesia with the patient or authorized representative who has indicated his/her understanding and acceptance.     Dental Advisory Given  Plan Discussed with: Anesthesiologist, CRNA and Surgeon  Anesthesia Plan Comments: (Patient consented for risks of anesthesia including but not limited to:  - adverse reactions to medications - risk of airway placement if required - damage to eyes, teeth, lips or other oral mucosa - nerve damage due to positioning  - sore throat or hoarseness - Damage to heart, brain, nerves, lungs, other parts of body or loss of life  Patient voiced understanding.)        Anesthesia Quick Evaluation

## 2020-07-08 NOTE — Progress Notes (Signed)
*  PRELIMINARY RESULTS* Echocardiogram Echocardiogram Transesophageal has been performed.  Sherrie Sport 07/08/2020, 8:38 AM

## 2020-07-08 NOTE — Progress Notes (Signed)
PROGRESS NOTE    Chris [name]  [id number] DOB: 1960-10-21 DOA: 07/03/2020 PCP: [name], MD    Chief Complaint  Patient presents with  . Shortness of Breath    Brief Narrative:  HPI per Dr. Ninetta Lights is a 60 y.o. male with medical history significant for diastolic and systolic heart failure, COPD on chronic oxygen, CKD 3, depression, hypertension, hyperlipidemia, gout, diabetes who presents with chest pain and worsening shortness of breat. Per chart in the [facility] sat was in the 70s and pt was switched over to CPAP. Patient had note has interim history of recent admission 06/16/2020-06/18/2020 at which time patient was diagnosed with Acute CHF exacerbation with acute hypoxic respiratory , patient was unfortunately at that time discharged AMA. He then presented to ED on 1/13 with sob/wheezing from jail he was found to have acute CHF he was treated in ed and lasix home dosage was increased and he was discharged to outpatient care with a course of steroids as well.   He now returns with similar complaint of acute onset of sob, wheezing, chest tightness x for less than one day.He also has associated complaints of cough, as well as tenderness of abdomen s/p coughing episodes. He notes no f/c/n/v/d/.   ED Course:  98.3, bp 134/88, hr 79, rr 19 , sat 98% on bipap  Wbc: 15.3,hgb 15.9, pmn 11.4, mag 2  Na 140, K 4.5, cl 101, glu 42, cr 2.16 at around base ce 31 [id number] less than prior 2000's UA :neg Vbg: 7.37/59 covid resp panel:neg  tx solumedrol, douneb Lasix 40mg   lactic 2  procal 0.15 tx , ctx/ azithromycin  ekg nsr lvh, with ant/lateral nonspecific twave findings  Assessment & Plan:   Principal Problem:   Acute and chronic respiratory failure (acute-on-chronic) (HCC) Active Problems:   Chronic systolic heart failure (HCC)   Mixed hyperlipidemia   Essential hypertension   CKD (chronic kidney disease) stage 3, GFR 30-59 ml/min (HCC)   Tobacco  dependence   History of gout   Acute on chronic combined systolic and diastolic CHF (congestive heart failure) (HCC)   Type 2 diabetes mellitus with stage 3 chronic kidney disease (HCC)   COPD with acute exacerbation (HCC)   Acute respiratory failure with hypoxia (HCC)   Typical atrial flutter (HCC)  1 acute respiratory failure with hypoxia secondary to acute on chronic combined systolic and diastolic heart failure exacerbation and possible acute COPD exacerbation Patient presented with worsening shortness of breath, noted to be hypoxic with sats in the 70s, complaining of chest tightness, noted to have crackles on examination.   Patient with prior 2D echo with EF of 20 to 25% with global hypokinesis.  Cardiac enzymes elevated but flattened.  2D echo not repeated as one recently done.   Patient was on Lasix 40 mg IV every 12 hours and transition to oral Lasix 40 mg daily.  Urine output of 3.5 L over the past 24 hours.  Patient is -14.6 L during his hospitalization.  Weights not recorded properly.   Clinical improvement.   Patient was on BiPAP on admission currently off BiPAP and on room air. Cardiac medications adjusted due to onset of atrial flutter and patient now on Lopressor 100 mg twice daily, BiDil, aspirin, statin, oral Cardizem. IV Cardizem discontinued.Delene Loll discontinued per cardiology.  Discontinue azithromycin after today's doses.  Continue oral Lasix.  Discontinue prednisone after today's doses.  CPAP nightly.  Cardiology following and appreciate their input  and recommendations.  Outpatient follow-up with cardiology.   2.  New onset atrial flutter Patient noted to go into atrial flutter the evening of 07/05/2020, requiring transfer to the stepdown unit.  Patient started on Cardizem drip per cardiology in addition to Lopressor 100 mg twice daily.  Patient on heparin drip for anticoagulation per cardiology.  Patient wants to have TEE with cardioversion on 07/07/2020, however due to  concerns for cocaine in his system was postponed and patient underwent TEE with cardioversion today(07/08/2020) without any complication and tolerated procedure well.  Back in sinus rhythm.  Cardizem drip has been discontinued.  Patient currently on oral Cardizem and Lopressor for rate control and to maintain sinus rhythm.  Patient on IV heparin and likely to be transition to Eliquis per cardiology.  Cardiology following and appreciate input and recommendations.   3.  Mild acute exacerbation of COPD Patient presenting with shortness of breath with chest tightness, some crackles, concern for possible COPD exacerbation.  Patient with clinical improvement with chest tightness. Patient also on IV diuretics. Patient given a dose of IV Solu-Medrol currently on prednisone 40 mg daily which we will continue through today. Continue scheduled DuoNebs, Claritin, PPI, Flonase, Pulmicort nebs, azithromycin.  Discontinue azithromycin after today's dose incentive spirometry. Flutter valve. Tobacco cessation stressed to patient. Outpatient follow-up.  4.  Elevated lactic acid level/leukocytosis Questionable etiology.  Patient currently afebrile.  Urinalysis unremarkable.  Blood cultures ordered and pending with no growth to date.  On empiric azithromycin.  Patient also on steroids.  Discontinue azithromycin after today's dose.  5.  Chronic kidney disease stage IIIb Baseline creatinine approximately 1.9-2. Creatinine trending back down after transitioning from IV Lasix to oral Lasix.  Creatinine currently at 1.83.  Follow.    6.  Hypertension Blood pressure improved.  Patient however in atrial flutter currently on metoprolol and oral Cardizem.  Cardizem drip discontinued.  On BiDil.  Delene Loll has been discontinued.  Per cardiology.    7.  Hyperlipidemia Statin.  8.  OSA CPAP nightly  9.  GERD PPI  10.  History of gout Continue allopurinol.    11.  Polysubstance abuse Cessation stressed to patient.  TOC  consult.  12.  Constipation Patient stated no bowel movement despite MiraLAX and sorbitol.  MiraLAX increased to twice daily.  Patient placed on Senokot-S twice daily.  Patient also given magnesium citrate p.o. x1.  Patient now with results and currently having a bowel movement.  May benefit from bowel regimen in the outpatient setting.   13.  Hyperkalemia Resolved.  Status post Kayexalate 45 g p.o. x1.    DVT prophylaxis: Heparin Code Status: Full Family Communication: Updated patient.  No family at bedside. Disposition:   Status is: Inpatient    Dispo: The patient is from: Home              Anticipated d/c is to: Home              Anticipated d/c date is: 07/09/2020 if continues to remain stable post cardioversion.              Patient currently on, IV heparin, IV Cardizem drip which has been transitioned off, had cardioversion this morning and not stable for discharge today.        Consultants:   Cardiology: Dr Nehemiah Massed 07/04/2020  Procedures:   Chest x-ray 07/03/2020  TEE cardioversion 07/08/2020 per Dr. Nehemiah Massed  Antimicrobials:   Oral azithromycin 07/04/2020>>> 07/08/2020  IV azithromycin 1/20 /2022x1 dose  IV  Rocephin 1/20/ 2022x1 dose   Subjective: On toilet. No CP. No SOB. Feeling better. Had cardioversion this morning. Having BM.   Objective: Vitals:   07/08/20 0828 07/08/20 0829 07/08/20 0830 07/08/20 0845  BP:   (!) 130/95 (!) 123/107  Pulse: 75 80 76 72  Resp: (!) 44 (!) 36 20 19  Temp:      TempSrc:      SpO2: 99% 98% 99% 99%  Weight:      Height:        Intake/Output Summary (Last 24 hours) at 07/08/2020 1113 Last data filed at 07/08/2020 0540 Gross per 24 hour  Intake 555.93 ml  Output 3500 ml  Net -2944.07 ml   Filed Weights   07/03/20 2031 07/08/20 0500  Weight: 122.9 kg 119.4 kg    Examination:  General exam: NAD Respiratory system: Lungs clear to auscultation bilaterally.  No wheezes, no crackles, no rhonchi.  Normal  respiratory effort.  Cardiovascular system: Regular rate rhythm no murmurs rubs or gallops.  No JVD.  No lower extremity edema.  Gastrointestinal system: Abdomen is soft, nontender, nondistended, positive bowel sounds.  No rebound.  No guarding.  Central nervous system: Alert and oriented. No focal neurological deficits. Extremities: Symmetric 5 x 5 power. Skin: No rashes, lesions or ulcers Psychiatry: Judgement and insight appear normal. Mood & affect appropriate.     Data Reviewed: I have personally reviewed following labs and imaging studies  CBC: Recent Labs  Lab 07/03/20 2031 07/04/20 0246 07/05/20 0359 07/06/20 1118 07/07/20 0137 07/08/20 0419  WBC 15.3* 13.4* 16.4* 15.1* 14.0* 17.6*  NEUTROABS 11.4*  --  13.8* 10.2*  --   --   HGB 15.9 14.9 14.4 15.7 16.2 14.6  HCT 49.8 45.3 45.9 49.0 50.4 45.8  MCV 92.7 91.7 94.4 91.8 92.0 91.8  PLT 234 217 150 256 242 [id number]    Basic Metabolic Panel: Recent Labs  Lab 07/03/20 2031 07/04/20 0246 07/05/20 0359 07/06/20 1118 07/07/20 0137 07/08/20 0419  NA 140  --  138 136 136 136  K 4.5  --  4.6 4.0 5.4* 4.7  CL 101  --  99 102 99 96*  CO2 28  --  25 25 24 28   GLUCOSE 42*  --  163* 158* 119* 117*  BUN 25*  --  40* 43* 53* 56*  CREATININE 2.16* 2.00* 1.75* 1.83* 2.00* 1.83*  CALCIUM 8.8*  --  8.6* 8.4* 9.0 8.9  MG 2.0  --  2.1 2.0 2.2 2.4    GFR: Estimated Creatinine Clearance: 59.4 mL/min (A) (by C-G formula based on SCr of 1.83 mg/dL (H)).  Liver Function Tests: Recent Labs  Lab 07/03/20 2031  AST 15  ALT 20  ALKPHOS 77  BILITOT 0.7  PROT 7.1  ALBUMIN 3.4*    CBG: Recent Labs  Lab 07/06/20 1528 07/07/20 1735 07/07/20 2122 07/08/20 0728 07/08/20 0804  GLUCAP 128* 149* 201* 104* 84     Recent Results (from the past 240 hour(s))  SARS Coronavirus 2 by RT PCR (hospital order, performed in [facility] hospital lab) Nasopharyngeal Nasopharyngeal Swab     Status: None   Collection Time: 07/03/20  8:34 PM    Specimen: Nasopharyngeal Swab  Result Value Ref Range Status   SARS Coronavirus 2 NEGATIVE NEGATIVE Final    Comment: (NOTE) SARS-CoV-2 target nucleic acids are NOT DETECTED.  The SARS-CoV-2 RNA is generally detectable in upper and lower respiratory specimens during the acute phase of infection. The lowest concentration of  SARS-CoV-2 viral copies this assay can detect is 250 copies / mL. A negative result does not preclude SARS-CoV-2 infection and should not be used as the sole basis for treatment or other patient management decisions.  A negative result may occur with improper specimen collection / handling, submission of specimen other than nasopharyngeal swab, presence of viral mutation(s) within the areas targeted by this assay, and inadequate number of viral copies (<250 copies / mL). A negative result must be combined with clinical observations, patient history, and epidemiological information.  Fact Sheet for Patients:   StrictlyIdeas.no  Fact Sheet for Healthcare Providers: BankingDealers.co.za  This test is not yet approved or  cleared by the [place] FDA and has been authorized for detection and/or diagnosis of SARS-CoV-2 by FDA under an Emergency Use Authorization (EUA).  This EUA will remain in effect (meaning this test can be used) for the duration of the COVID-19 declaration under Section 564(b)(1) of the Act, 21 U.S.C. section 360bbb-3(b)(1), unless the authorization is terminated or revoked sooner.  Performed at [facility], [address] [place], [place] [address]   Culture, blood (routine x 2)     Status: None   Collection Time: 07/03/20 10:45 PM   Specimen: BLOOD  Result Value Ref Range Status   Specimen Description BLOOD RIGHT ASSIST CONTROL  Final   Special Requests   Final    BOTTLES DRAWN AEROBIC AND ANAEROBIC Blood Culture adequate volume   Culture   Final    NO GROWTH 5 DAYS Performed  at [facility], [address], [place], [place] [address]    Report Status 07/08/2020 FINAL  Final  Culture, blood (routine x 2)     Status: None   Collection Time: 07/03/20 10:45 PM   Specimen: BLOOD  Result Value Ref Range Status   Specimen Description BLOOD LEFT HAND  Final   Special Requests   Final    BOTTLES DRAWN AEROBIC AND ANAEROBIC Blood Culture adequate volume   Culture   Final    NO GROWTH 5 DAYS Performed at [facility], [address], [place], [place] [address]    Report Status 07/08/2020 FINAL  Final  MRSA PCR Screening     Status: None   Collection Time: 07/06/20  7:00 AM   Specimen: Nasopharyngeal  Result Value Ref Range Status   MRSA by PCR NEGATIVE NEGATIVE Final    Comment:        The GeneXpert MRSA Assay (FDA approved for NASAL specimens only), is one component of a comprehensive MRSA colonization surveillance program. It is not intended to diagnose MRSA infection nor to guide or monitor treatment for MRSA infections. Performed at [facility], [address], [place], [place] [address]          Radiology Studies: No results found.      Scheduled Meds: . allopurinol  200 mg Oral Daily  . aspirin EC  81 mg Oral Daily  . atorvastatin  40 mg Oral q1800  . azithromycin  500 mg Oral Daily  . budesonide (PULMICORT) nebulizer solution  0.5 mg Nebulization BID  . butamben-tetracaine-benzocaine      . Chlorhexidine Gluconate Cloth  6 each Topical Daily  . diltiazem  180 mg Oral Daily  . fluticasone  2 spray Each Nare Daily  . furosemide  40 mg Oral Daily  . insulin aspart  0-9 Units Subcutaneous TID WC  . ipratropium  0.5 mg Nebulization TID  . isosorbide-hydrALAZINE  1 tablet  Oral TID  . levalbuterol  0.63 mg Nebulization TID  . lidocaine      . loratadine  10 mg Oral Daily  . metoprolol tartrate  100 mg Oral BID  . montelukast  10 mg Oral QHS  . pantoprazole  40 mg Oral Q0600  . polyethylene glycol  17 g  Oral BID  . senna-docusate  1 tablet Oral BID  . sodium chloride flush  3 mL Intravenous Q12H   Continuous Infusions: . sodium chloride    . diltiazem (CARDIZEM) infusion 10 mg/hr (07/07/20 0731)  . heparin 1,850 Units/hr (07/08/20 0537)     LOS: 4 days    Time spent: 40 minutes    Irine Seal, MD [facility]   To contact the attending provider between 7A-7P or the covering provider during after hours 7P-7A, please log into the web site [email or web address] and access using universal [place] password for that web site. If you do not have the password, please call the hospital operator.  07/08/2020, 11:13 AM

## 2020-07-08 NOTE — Progress Notes (Signed)
[place] for Heparin Indication: atrial fibrillation  Patient Measurements: Height: 6' (182.9 cm) Weight: 122.9 kg (271 lb) IBW/kg (Calculated) : 77.6 HEPARIN DW (KG): 104.8  Labs: Recent Labs    07/06/20 1118 07/06/20 1819 07/07/20 0137 07/07/20 0801 07/08/20 0419  HGB 15.7  --  16.2  --  14.6  HCT 49.0  --  50.4  --  45.8  PLT 256  --  242  --  255  LABPROT  --   --   --  13.3  --   INR  --   --   --  1.1  --   HEPARINUNFRC 0.35   < > 0.41 0.43 0.29*  CREATININE 1.83*  --  2.00*  --  1.83*   < > = values in this interval not displayed.   Estimated Creatinine Clearance: 60.3 mL/min (A) (by C-G formula based on SCr of 1.83 mg/dL (H)).  Medical History: Past Medical History:  Diagnosis Date  . CHF (congestive heart failure) ([place])   . Chronic kidney disease   . COPD (chronic obstructive pulmonary disease) ([place])   . Coronary artery disease   . Depression   . Diabetes mellitus without complication ([place])   . GERD (gastroesophageal reflux disease)   . Gout   . Hypertension   . Influenza A with respiratory manifestations   . Mental disorder    Assessment: 60 y.o. male with medical history significant of diastolic and systolic heart failure, COPD on chronic oxygen, CKD 3, depression, hypertension, hyperlipidemia, gout, diabetes who presents with chest pain and worsening shortness of breath No anticoagulants PTA per med list.  H&H, platelets stable and wnl.  Date Time HL Rate/comment 1/23 1118 0.35 1450 units/hr 1/23 1819 0.27 1450 units/hr 1/24 0137 0.41 1650 units/hr, therapeutic x 1 1/24 0801 0.43 1650 units/hr, therapeutic x 2 1/25 0419 0.29 Subtherapeutic  Goal of Therapy:  Heparin level 0.3-0.7 units/ml Monitor platelets by anticoagulation protocol: Yes   Plan:   Bolus 1500 units  Increase heparin rate to 1850 units/hr  Recheck anti-Xa level in 6 hours  Daily CBC per protocol while on heparin infusion  Renda Rolls, PharmD, [facility] 07/08/2020 5:20 AM

## 2020-07-08 NOTE — Patient Instructions (Signed)
Visit Information  Chris [name] was given information about Medicaid Managed Care team care coordination services as a part of their Healthy [facility] Medicaid benefit. Chris [name] verbally consented to engagement with the [facility] Managed Care team.   For questions related to your Healthy [facility] health plan, please call: [phone] or visit the homepage here: [email or web address]  If you would like to schedule transportation through your Healthy [facility] plan, please call the following number at least 2 days in advance of your appointment: [phone]  Chris [name] - following are the goals we discussed in your visit today:  Goals Addressed   None      Social Worker will follow up with patient with any resources she is able to locate .[name]   Chris [name]  Following is a copy of your plan of care:  Patient Care Plan: Heart Failure (Adult)    Problem Identified: Symptom Exacerbation (Heart Failure)     Goal: Symptom Exacerbation Prevented or Minimized   Start Date: 07/02/2020  Expected End Date: 09/01/2020  This Visit's Progress: On track  Priority: High  Note:   Current Barriers:  . Chris [name]  Nurse Case Manager Clinical Goal(s):  [name] Over the next 30 days, patient will work with Chris [name] to address needs related to housing . Over the next 30 days, patient will meet with RN Care Manager to address needs related to managing CHF and DMII . Over the next 30 days, patient will demonstrate improved health management independence as evidenced bytaking prescribed medications, reporting any changes in symptoms to PCP such as swelling, changes in breathing, new cough  Interventions:  . Inter-disciplinary care team collaboration (see longitudinal plan of care) . Evaluation of current treatment plan related to CHF and DMII and patient's adherence to plan as established by  provider. . Advised patient to follow up with PCP with any concerns or changes in symptoms . Reviewed medications with patient and discussed that Chris [name] has one prescription that was unable to be filled yesterday when he picked up his other medications. RNCM called pharmacy and entresto is ready to be picked up. Chris [name] aware and plans to get it today . Collaborated with [name] regarding housing need . Discussed plans with patient for ongoing care management follow up and provided patient with direct contact information for care management team . Advised patient, providing education and rationale, to weigh daily and record, calling PCP for weight gain of 3lbs overnight or 5 pounds in a week.  [name] Pharmacy referral for medication review  Patient Goals/Self-Care Activities Over the next 30 days, patient will: -Call to fill prescriptions one week before I run out of medication -Take all medications as prescribed - call office if I gain more than 2 pounds in one day or 5 pounds in one week - use salt in moderation - watch for swelling in feet, ankles and legs every day  - eat more whole grains, fruits and vegetables, lean meats and healthy fats - know when to call the doctor - track symptoms and what helps feel better or worse - dress right for the weather, hot or cold   Follow Up Plan: Telephone follow up appointment with Managed Medicaid care management team member scheduled for:07/30/20 @ 2:30pm

## 2020-07-08 NOTE — Progress Notes (Signed)
[facility] Cardiology [facility] Encounter Note  Patient: Chris [name] / Admit Date: 07/03/2020 / Date of Encounter: 07/08/2020, 1:25 PM   Subjective: 1/23.  Patient overall feels relatively the same from yesterday without evidence of chest pain worsening shortness of breath dizziness nausea or diaphoresis.  The patient has had continued mild cough and congestion.  He does have what appears to be acute on chronic obstructive pulmonary disease exacerbation which has driven some hypoxia.  Lungs have been clear although had admitted to due to having lower extremity edema and abdominal edema which is improved after furosemide use.  Glomerular filtration rate continues to be reasonable at 45.  No evidence of myocardial infarction with troponin at 31.  The patient has developed acute atrial flutter with rapid ventricular rate overnight for which she has received a slight amount of metoprolol which is not particularly helped and will need adjustments of medication management for better heart rate control and possible electrical cardioversion to normal rhythm.  We have discussed at length this regimen and potential treatment including electrical cardioversion and all of its risks and benefits.  The patient understands well  1/24.  The patient has had continued atrial flutter with variable heart rate but much better controlled at this time with an average heart rate under 100 bpm.  This is accomplished by diltiazem drip and metoprolol combination.  We will try to change the diltiazem to oral medication at this time to control.  We have discussed at length transesophageal echocardiogram to assess for atrial appendage thrombus as well as electrical cardioversion due to concerns that atrial flutter responds best to cardioversion rather than other antiarrhythmics.  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  Patient has tolerated transesophageal echocardiogram and electrical cardioversion of atrial flutter into normal sinus rhythm without evidence of significant symptoms or side effect or complication.  Patient feels well at this time much better than before with significant improvement acute on chronic systolic dysfunction congestive heart failure and exacerbation of COPD.  We have used appropriate medication management for heart rate control of atrial flutter and now will use oral medications including beta-blocker and low-dose diltiazem to maintain normal sinus rhythm at this time.  In addition to that the patient has discussed some treatment with anticoagulation for further risk reduction in stroke with atrial fibrillation including Eliquis use. Review of Systems: Positive for: Shortness of breath cough congestion Negative for: Vision change, hearing change, syncope, dizziness, nausea, vomiting,diarrhea, bloody stool, stomach pain, positive for cough, congestion, negative for diaphoresis, urinary frequency, urinary pain,skin lesions, skin rashes Others previously listed  Objective: Telemetry: Atrial flutter with rapid ventricular rate Physical Exam: Blood pressure (!) 141/98, pulse 78, temperature 98.1 F (36.7 C), temperature source Oral, resp. rate 17, height 6' (1.829 m), weight 119.4 kg, SpO2 98 %. Body mass index is 35.7 kg/m. General: Well developed, well nourished, in no acute distress. Head: Normocephalic, atraumatic, sclera non-icteric, no xanthomas, nares are without discharge. Neck: No apparent masses Lungs: Normal respirations with few diffuse wheezes, some rhonchi, no rales , no crackles   Heart: Regular rate and rhythm, normal S1 S2, no murmur, no rub, no gallop, PMI is normal size and placement, carotid upstroke normal without bruit, jugular venous pressure normal Abdomen: Soft, non-tender,  non-distended with normoactive bowel sounds. No hepatosplenomegaly. Abdominal aorta is normal size  without bruit Extremities: Trace edema, no clubbing, no cyanosis, no ulcers,  Peripheral: 2+ radial, 2+ femoral, 2+ dorsal pedal pulses Neuro: Alert and oriented. Moves all extremities spontaneously. Psych:  Responds to questions appropriately with a normal affect.   Intake/Output Summary (Last 24 hours) at 07/08/2020 1325 Last data filed at 07/08/2020 1100 Gross per 24 hour  Intake 795.93 ml  Output 3800 ml  Net -3004.07 ml    Inpatient Medications:  . allopurinol  200 mg Oral Daily  . apixaban  5 mg Oral BID  . atorvastatin  40 mg Oral q1800  . azithromycin  500 mg Oral Daily  . budesonide (PULMICORT) nebulizer solution  0.5 mg Nebulization BID  . butamben-tetracaine-benzocaine      . Chlorhexidine Gluconate Cloth  6 each Topical Daily  . diltiazem  180 mg Oral Daily  . fluticasone  2 spray Each Nare Daily  . furosemide  40 mg Oral Daily  . insulin aspart  0-9 Units Subcutaneous TID WC  . ipratropium  0.5 mg Nebulization TID  . isosorbide-hydrALAZINE  1 tablet Oral TID  . levalbuterol  0.63 mg Nebulization TID  . lidocaine      . loratadine  10 mg Oral Daily  . metoprolol tartrate  100 mg Oral BID  . montelukast  10 mg Oral QHS  . pantoprazole  40 mg Oral Q0600  . polyethylene glycol  17 g Oral BID  . senna-docusate  1 tablet Oral BID  . sodium chloride flush  3 mL Intravenous Q12H   Infusions:  . sodium chloride      Labs: Recent Labs    07/07/20 0137 07/08/20 0419  NA 136 136  K 5.4* 4.7  CL 99 96*  CO2 24 28  GLUCOSE 119* 117*  BUN 53* 56*  CREATININE 2.00* 1.83*  CALCIUM 9.0 8.9  MG 2.2 2.4   No results for input(s): AST, ALT, ALKPHOS, BILITOT, PROT, ALBUMIN in the last 72 hours. Recent Labs    07/06/20 1118 07/07/20 0137 07/08/20 0419  WBC 15.1* 14.0* 17.6*  NEUTROABS 10.2*  --   --   HGB 15.7 16.2 14.6  HCT 49.0 50.4 45.8  MCV 91.8 92.0 91.8   PLT 256 242 255   No results for input(s): CKTOTAL, CKMB, TROPONINI in the last 72 hours. Invalid input(s): POCBNP No results for input(s): HGBA1C in the last 72 hours.   Weights: Filed Weights   07/03/20 2031 07/08/20 0500  Weight: 122.9 kg 119.4 kg     Radiology/Studies:  DG Chest 1 View  Result Date: 06/16/2020 CLINICAL DATA:  Chest pain and short of breath EXAM: CHEST  1 VIEW COMPARISON:  05/15/2020 FINDINGS: Cardiac enlargement without heart failure or edema. Hypoventilation with bibasilar atelectasis/infiltrate. No significant effusion. IMPRESSION: Hypoventilation with bibasilar atelectasis/infiltrate. Electronically Signed   By: Franchot Gallo M.D.   On: 06/16/2020 07:54   DG Chest 2 View  Result Date: 06/26/2020 CLINICAL DATA:  Shortness of breath. EXAM: CHEST - 2 VIEW COMPARISON:  06/16/2020 FINDINGS: The cardio pericardial silhouette is enlarged. There is pulmonary vascular congestion without overt pulmonary edema. Diffuse bilateral interstitial opacity suggest pulmonary edema. No substantial pleural effusion. The visualized bony structures of the thorax show no acute abnormality. IMPRESSION: Cardiomegaly with vascular congestion and probable interstitial edema. Electronically Signed   By: Misty Stanley M.D.   On: 06/26/2020 15:44   DG Chest Portable 1 View  Result Date: 07/03/2020 CLINICAL DATA:  Shortness of breath EXAM: PORTABLE CHEST 1 VIEW COMPARISON:  06/26/2020 FINDINGS: Cardiac shadow is stable. Aortic calcifications are again seen. Lungs are well aerated bilaterally. Mild vascular congestion is again seen. Bibasilar atelectatic changes are noted. Artifact is noted overlying the anterior chest. No other focal abnormality is seen. IMPRESSION: Bibasilar atelectasis. Electronically Signed   By: Inez Catalina M.D.   On: 07/03/2020 20:44   ECHOCARDIOGRAM COMPLETE  Result Date: 06/17/2020    ECHOCARDIOGRAM REPORT   Patient Name:   DODD SCIANNA Date of Exam: 06/17/2020 Medical  Rec #:  [id number]       Height:       72.0 in Accession #:    [id number]      Weight:       264.4 lb Date of Birth:  02-02-1961       BSA:          2.399 m Patient Age:    29 years        BP:           111/80 mmHg Patient Gender: M               HR:           71 bpm. Exam Location:  Inpatient Procedure: 2D Echo, Cardiac Doppler and Color Doppler Indications:    CHF  History:        Patient has prior history of Echocardiogram examinations, most                 recent 12/25/2019. CHF, CAD, COPD, Signs/Symptoms:Chest Pain;                 Risk Factors:Hypertension, Diabetes, Obesity and Current Smoker.  Sonographer:    Dustin Flock Referring Phys: [id number] [place]  1. Left ventricular ejection fraction, by estimation, is 20 to 25%. The left ventricle has severely decreased function. The left ventricle demonstrates global hypokinesis. The left ventricular internal cavity size was mildly to moderately dilated. There  is mild concentric left ventricular hypertrophy. Left ventricular diastolic parameters are consistent with Grade III diastolic dysfunction (restrictive). Elevated left atrial pressure.  2. Right ventricular systolic function is moderately reduced. The right ventricular size is moderately enlarged. There is mildly elevated pulmonary artery systolic pressure. The estimated right ventricular systolic pressure is 123XX123 mmHg.  3. Left atrial size was mildly dilated.  4. Right atrial size was mildly dilated.  5. The mitral valve is grossly normal. Mild mitral valve regurgitation. No evidence of mitral stenosis.  6. The aortic valve is tricuspid. Aortic valve regurgitation is not visualized. No aortic stenosis is present.  7. The inferior vena cava is dilated in size with <50% respiratory variability, suggesting right atrial pressure of 15 mmHg. Comparison(s): Changes from prior study are noted. EF reduced to 20-25%. Moderately dilated RV with moderately reduced function. Grade 3 DD with  elevated RVSP. FINDINGS  Left Ventricle: Left ventricular ejection fraction, by estimation, is 20 to 25%. The left ventricle has severely decreased function. The left ventricle demonstrates global hypokinesis. The left ventricular internal cavity size was mildly to moderately dilated. There is mild concentric left ventricular hypertrophy. Left ventricular diastolic parameters are consistent with Grade III diastolic dysfunction (restrictive). Elevated left atrial pressure. Right Ventricle: The right ventricular size is moderately enlarged. No increase in right ventricular wall thickness. Right ventricular systolic function is moderately reduced. There is mildly elevated pulmonary artery systolic pressure. The tricuspid regurgitant velocity is 2.62 m/s, and with an assumed right atrial pressure of 15 mmHg, the estimated right  ventricular systolic pressure is 16.1 mmHg. Left Atrium: Left atrial size was mildly dilated. Right Atrium: Right atrial size was mildly dilated. Pericardium: Trivial pericardial effusion is present. Mitral Valve: The mitral valve is grossly normal. Mild mitral valve regurgitation. No evidence of mitral valve stenosis. Tricuspid Valve: The tricuspid valve is grossly normal. Tricuspid valve regurgitation is trivial. No evidence of tricuspid stenosis. Aortic Valve: The aortic valve is tricuspid. Aortic valve regurgitation is not visualized. No aortic stenosis is present. Pulmonic Valve: The pulmonic valve was grossly normal. Pulmonic valve regurgitation is not visualized. No evidence of pulmonic stenosis. Aorta: The aortic root is normal in size and structure. Venous: The inferior vena cava is dilated in size with less than 50% respiratory variability, suggesting right atrial pressure of 15 mmHg. IAS/Shunts: The atrial septum is grossly normal.  LEFT VENTRICLE PLAX 2D LVIDd:         6.50 cm  Diastology LVIDs:         5.70 cm  LV e' medial:    3.05 cm/s LV PW:         1.50 cm  LV E/e' medial:  29.6  LV IVS:        1.30 cm  LV e' lateral:   4.24 cm/s LVOT diam:     3.00 cm  LV E/e' lateral: 21.3 LV SV:         115 LV SV Index:   48 LVOT Area:     7.07 cm  RIGHT VENTRICLE RV Basal diam:  4.80 cm RV S prime:     14.60 cm/s TAPSE (M-mode): 2.1 cm LEFT ATRIUM              Index       RIGHT ATRIUM           Index LA diam:        4.60 cm  1.92 cm/m  RA Area:     35.10 cm LA Vol (A2C):   103.0 ml 42.94 ml/m RA Volume:   149.00 ml 62.11 ml/m LA Vol (A4C):   86.8 ml  36.18 ml/m LA Biplane Vol: 99.1 ml  41.31 ml/m  AORTIC VALVE LVOT Vmax:   97.50 cm/s LVOT Vmean:  60.600 cm/s LVOT VTI:    0.163 m  AORTA Ao Root diam: 3.80 cm MITRAL VALVE               TRICUSPID VALVE MV Area (PHT): 3.42 cm    TR Peak grad:   27.5 mmHg MV Decel Time: 222 msec    TR Vmax:        262.00 cm/s MV E velocity: 90.40 cm/s MV A velocity: 31.70 cm/s  SHUNTS MV E/A ratio:  2.85        Systemic VTI:  0.16 m                            Systemic Diam: 3.00 cm Eleonore Chiquito MD Electronically signed by Eleonore Chiquito MD Signature Date/Time: 06/17/2020/4:57:34 PM    Final      Assessment and Recommendation  60 y.o. male with known dilated cardiomyopathy with acute on chronic systolic dysfunction congestive heart failure secondary to cough and congestion and COPD with hypertension hyperlipidemia without evidence of myocardial infarction but new onset atrial flutter with rapid ventricular rate now converted into normal sinus rhythm without evidence of complication 1.  Continuation of oral diltiazem metoprolol combination for maintenance of normal sinus rhythm and  discontinuation of diltiazem drip 2.  Anticoagulation for further risk reduction in stroke with atrial fibrillation using Eliquis 5 mg twice per day  3.  Continuation of other medication management for cardiomyopathy including reinstatement of Entresto as able with blood pressure tolerates.  Then as outpatient potentially reinstate being of isosorbide hydralazine as needed 4.  No  further cardiac diagnostics necessary at this time due to improvements of patient 5.  Begin ambulation and cardiac rehabilitation.  The patient is improved from cardiovascular standpoint ambulating well with above adjustments medication would be okay for him discharged home with follow-up next week.  Signed, [name] M.D. FACC

## 2020-07-09 ENCOUNTER — Telehealth: Payer: Self-pay

## 2020-07-09 ENCOUNTER — Other Ambulatory Visit: Payer: Self-pay

## 2020-07-09 ENCOUNTER — Encounter: Payer: Self-pay | Admitting: Internal Medicine

## 2020-07-09 ENCOUNTER — Other Ambulatory Visit: Payer: Self-pay | Admitting: Internal Medicine

## 2020-07-09 LAB — BASIC METABOLIC PANEL
Anion gap: 9 (ref 5–15)
BUN: 60 mg/dL — ABNORMAL HIGH (ref 6–20)
CO2: 28 mmol/L (ref 22–32)
Calcium: 9.3 mg/dL (ref 8.9–10.3)
Chloride: 98 mmol/L (ref 98–111)
Creatinine, Ser: 2.14 mg/dL — ABNORMAL HIGH (ref 0.61–1.24)
GFR, Estimated: 35 mL/min — ABNORMAL LOW (ref >60)
Glucose, Bld: 113 mg/dL — ABNORMAL HIGH (ref 70–99)
Potassium: 5.4 mmol/L — ABNORMAL HIGH (ref 3.5–5.1)
Sodium: 135 mmol/L (ref 135–145)

## 2020-07-09 LAB — CBC
HCT: 47 % (ref 39.0–52.0)
Hemoglobin: 15.2 g/dL (ref 13.0–17.0)
MCH: 29.8 pg (ref 26.0–34.0)
MCHC: 32.3 g/dL (ref 30.0–36.0)
MCV: 92.2 fL (ref 80.0–100.0)
Platelets: 247 10*3/uL (ref 150–400)
RBC: 5.1 MIL/uL (ref 4.22–5.81)
RDW: 14.2 % (ref 11.5–15.5)
WBC: 16.3 10*3/uL — ABNORMAL HIGH (ref 4.0–10.5)
nRBC: 0 % (ref 0.0–0.2)

## 2020-07-09 LAB — GLUCOSE, CAPILLARY
Glucose-Capillary: 111 mg/dL — ABNORMAL HIGH (ref 70–99)
Glucose-Capillary: 121 mg/dL — ABNORMAL HIGH (ref 70–99)
Glucose-Capillary: 122 mg/dL — ABNORMAL HIGH (ref 70–99)
Glucose-Capillary: 134 mg/dL — ABNORMAL HIGH (ref 70–99)

## 2020-07-09 LAB — MAGNESIUM: Magnesium: 2.5 mg/dL — ABNORMAL HIGH (ref 1.7–2.4)

## 2020-07-09 MED ORDER — DILTIAZEM HCL ER COATED BEADS 180 MG PO CP24: 180.0000 mg | ORAL_CAPSULE | Freq: Every day | ORAL | 0 refills | Status: DC

## 2020-07-09 MED ORDER — APIXABAN 5 MG PO TABS: 5.0000 mg | ORAL_TABLET | Freq: Two times a day (BID) | ORAL | 0 refills | Status: DC

## 2020-07-09 MED ORDER — LOKELMA 5 G PO PACK: 5.0000 g | PACK | ORAL | 0 refills | Status: DC

## 2020-07-09 MED ORDER — METOPROLOL TARTRATE 100 MG PO TABS: 100.0000 mg | ORAL_TABLET | Freq: Two times a day (BID) | ORAL | 0 refills | Status: DC

## 2020-07-09 MED ORDER — FUROSEMIDE 40 MG PO TABS: 40.0000 mg | ORAL_TABLET | Freq: Every day | ORAL | 2 refills | Status: DC

## 2020-07-09 MED ORDER — SODIUM ZIRCONIUM CYCLOSILICATE 5 G PO PACK
5.0000 g | PACK | Freq: Once | ORAL | Status: AC
  Administered 2020-07-09: 5 g via ORAL
  Filled 2020-07-09: qty 1

## 2020-07-09 MED FILL — METOPROLOL TARTRATE 100 MG: 100 | 30 days supply | Qty: 60 | Fill #0

## 2020-07-09 MED FILL — DILTIAZEM 24HR ER 180 MG CA: 180 | 30 days supply | Qty: 30 | Fill #0

## 2020-07-09 MED FILL — ELIQUIS 5 MG TABLET: 5 | 30 days supply | Qty: 60 | Fill #0

## 2020-07-09 NOTE — Patient Outreach (Signed)
Medicaid Managed Care Social Work Note  07/09/2020 Name:  Chris [name] MRN:  [id number] DOB:  1960/12/10  Chris [name] is an 60 y.o. year old male who is a primary patient of [name], MD.  The Medicaid Managed Care Coordination team was consulted for assistance with:  housing  Chris [name] was given information about Medicaid Managed CareCoordination services today. Chris [name] agreed to services and verbal consent obtained.  Engaged with patient  for by telephone forfollow up visit in response to referral for case management and/or care coordination services.   Assessments/Interventions:  Review of past medical history, allergies, medications, health status, including review of consultants reports, laboratory and other test data, was performed as part of comprehensive evaluation and provision of chronic care management services.  SDOH: (Social Determinant of Health) assessments and interventions performed:  Provided patient with information about the [facility]. [name] contacted [facility] to complete a referral for patient to get into the homeless motel. [name] left a voicemail. [name] informed patient that the process can take a long time. Patient states he does not know why no one wants to help him. [name] informed patient he may be able to go to a shelter in [facility], patient states he does not feel safe in [place].   Advanced Directives Status:  Not addressed in this encounter.  Care Plan                 No Known Allergies  Medications Reviewed Today    Reviewed by Piscitello, Precious Haws, MD (Physician) on 07/08/20 at 0756  Med List Status: Complete  Medication Order Taking? Sig Documenting Provider Last Dose Status Informant  albuterol (PROVENTIL) (2.5 MG/3ML) 0.083% nebulizer solution [id number] Yes Take 3 mLs by nebulization every 6 (six) hours as needed for shortness of breath. [name], MD prn prn Active Pharmacy Records           Med Note  Quinn [name] May 04, 2020  3:59 PM) LF 04/18/2020 [facility] per external pharmacy records (Dr First)  albuterol (VENTOLIN HFA) 108 [phone] Base) MCG/ACT inhaler [id number] Yes Inhale 2 puffs into the lungs every 6 (six) hours as needed for wheezing or shortness of breath. [name], MD prn prn Active Pharmacy Records           Med Note Chris [name], Chris [name]   Fri Jul 04, 2020  9:48 AM)    allopurinol (ZYLOPRIM) 100 MG tablet [id number] Yes Take 2 tablets (200 mg total) by mouth daily. [name], MD unknown unknown Active Pharmacy Records  aspirin 81 MG EC tablet [id number] No Take 1 tablet (81 mg total) by mouth daily. [name], MD unknown unknown Active Pharmacy Records  atorvastatin (LIPITOR) 40 MG tablet [id number] Yes Take 1 tablet (40 mg total) by mouth daily at 6 PM. [name], MD unknown unknown Active Pharmacy Records  budesonide-formoterol [facility]) 160-4.5 MCG/ACT inhaler [id number] Yes Inhale 2 puffs into the lungs 2 (two) times daily. [name], MD unknown unknown Active Pharmacy Records           Med Note Chris [name], [facility]   Fri Jul 04, 2020  9:46 AM)    carvedilol (COREG) 25 MG tablet [id number] Yes Take 1 tablet (25 mg total) by mouth 2 (two) times daily with a meal. [name], MD unknown unknown Active Pharmacy Records  diclofenac Sodium (VOLTAREN) 1 % GEL [id number] Yes Apply 2 g  topically 4 (four) times daily. Use on elbow pain Chris Anis, MD unknown unknown Active Pharmacy Records  fluticasone [facility]) 50 MCG/ACT nasal spray [id number] Yes Place 1 spray into both nostrils daily as needed for allergies or rhinitis. [name], Chris Penna, MD prn prn Active Pharmacy Records  furosemide (LASIX) 40 MG tablet [id number] Yes Take 1.5 tablets (60 mg total) by mouth daily. [name], MD unknown unknown Active Pharmacy Records  glipiZIDE (GLUCOTROL) 5 MG tablet [id number] Yes Take 0.5 tablets (2.5 mg total) by  mouth 2 (two) times daily before a meal. [name], MD unknown unknown Active Pharmacy Records  isosorbide-hydrALAZINE (BIDIL) 20-37.5 MG tablet [id number] Yes Take 1 tablet by mouth 3 (three) times daily. [name], MD unknown unknown Active Pharmacy Records  montelukast (SINGULAIR) 10 MG tablet [id number] Yes Take 1 tablet (10 mg total) by mouth at bedtime. [name], MD unknown unknown Active Pharmacy Records  predniSONE (DELTASONE) 20 MG tablet [id number] Yes Take 2 tabs PO daily x 2 days, then 1.5 tab daily x 2 days then 1 tab x2 days then 1/2 tab x 2 days [name], MD unknown unknown Active Pharmacy Records  sacubitril-valsartan [facility]) 24-26 MG [id number] Yes Take 1 tablet by mouth 2 (two) times daily. [name], MD unknown unknown Active Pharmacy Records          Patient Active Problem List   Diagnosis Date Noted  . COPD exacerbation ([place])   . Typical atrial flutter ([place])   . Acute respiratory failure with hypoxia ([place]) 07/05/2020  . CHF (congestive heart failure) ([place]) 07/04/2020  . Acute exacerbation of CHF (congestive heart failure) ([place]) 06/16/2020  . COPD with acute exacerbation ([place]) 06/16/2020  . Influenza vaccine refused 05/06/2020  . Acute on chronic combined systolic (congestive) and diastolic (congestive) heart failure ([place]) 05/05/2020  . Acute decompensated heart failure ([place]) 05/04/2020  . Illiteracy 05/04/2020  . Chronic respiratory failure with hypoxia, on home oxygen therapy ([place]) 12/28/2019  . Type 2 diabetes mellitus with stage 3 chronic kidney disease ([place]) 12/25/2019  . Acute and chronic respiratory failure (acute-on-chronic) ([place]) 12/25/2019  . Acute on chronic combined systolic and diastolic CHF (congestive heart failure) ([place]) 10/26/2019  . Elevated troponin I level 10/26/2019  . Acute on chronic diastolic (congestive) heart failure ([place]) 10/26/2019  . History of gout 02/01/2019  . Seasonal allergic rhinitis due  to pollen 02/01/2019  . Tobacco dependence 11/30/2018  . Microscopic hematuria 11/30/2018  . Depression 11/30/2018  . Difficulty controlling anger 11/30/2018  . CAP (community acquired pneumonia) 08/11/2018  . COPD (chronic obstructive pulmonary disease) ([place])   . CKD (chronic kidney disease) stage 3, GFR 30-59 ml/min (HCC) 08/10/2018  . Recurrent epistaxis 04/21/2018  . Mixed hyperlipidemia 07/28/2017  . Essential hypertension 07/28/2017  . Chronic systolic heart failure ([place]) 10/25/2014  . Cocaine abuse ([place]) 02/20/2013  . Cannabis abuse 02/20/2013  . Back pain, chronic 02/20/2013    Conditions to be addressed/monitored per PCP order:  Homelessness  Care Plan : General Social Work (Adult)  Updates made by Ethelda Chick since 07/09/2020 12:00 AM    Problem: Homelessness   Onset Date: 07/09/2020  Note:   LAN HODGSON is a 60 y.o. year old male who sees [name], MD for primary care. The  Medicaid Managed Care team was consulted for assistance with Housing barriers. Mr. Conliffe was given information about Care Management services, agreed to services, and verbal consent for services was obtained.  Interventions:  . Patient interviewed and appropriate assessments performed . Collaborated with clinical team regarding patient needs  . SDOH (Social Determinants of Health) assessments performed: Yes .     [name] Provided patient with information about the [facility]. [name] contacted [facility] to complete a referral for patient to get into the homeless motel. [name] left a voicemail. [name] informed patient that the process can take a long time. Patient states he does not know why no one wants to help him. [name] informed patient he may be able to go to a shelter in [place], patient states he does not feel safe in [place].  [name] contacted [employer] in [place] [phone] and left a voicemail for the intake coordinator. . [name] contacted [place] to speak with a  Education officer, museum but kept getting transferred.  . Patient states he does not have any money to get a room and become frustrated again stating that we "[place]"do not want to help him. [name] informed patient that she will wait for the [name] and/or the [facility] to contact her back. [name] informed patient she could provide him with the information for [name] but patient did not want to call.  Plan:  . Over the next 30-60 days, patient will work with [name] to address needs related to Housing barriers . Social Worker will continue to follow up with patient.   [name], [name], [place]  High Risk Managed Medicaid Team           Follow up:  Patient agrees to Care Plan and Follow-up.  Plan: The Managed Medicaid care management team will reach out to the patient again over the next 7 days.  Date/time of next scheduled Social Work care management/care coordination outreach:  07/22/20  [name], [place], [place] Medicaid Team

## 2020-07-09 NOTE — Progress Notes (Signed)
Patient has refused cpap. States cannot tolerate.

## 2020-07-09 NOTE — Progress Notes (Signed)
Chris [name] to be D/C'd Home per MD order.  Discussed prescriptions and follow up appointments with the patient. Prescriptions electronically submitted, medication list explained in detail. Pt verbalized understanding.  Allergies as of 07/09/2020   No Known Allergies     Medication List    STOP taking these medications   carvedilol 25 MG tablet Commonly known as: COREG   predniSONE 20 MG tablet Commonly known as: DELTASONE   sacubitril-valsartan 24-26 MG Commonly known as: ENTRESTO     TAKE these medications   albuterol 108 (90 Base) MCG/ACT inhaler Commonly known as: VENTOLIN HFA Inhale 2 puffs into the lungs every 6 (six) hours as needed for wheezing or shortness of breath.   albuterol (2.5 MG/3ML) 0.083% nebulizer solution Commonly known as: PROVENTIL Take 3 mLs by nebulization every 6 (six) hours as needed for shortness of breath.   allopurinol 100 MG tablet Commonly known as: ZYLOPRIM Take 2 tablets (200 mg total) by mouth daily.   apixaban 5 MG Tabs tablet Commonly known as: ELIQUIS Take 1 tablet (5 mg total) by mouth 2 (two) times daily.   aspirin 81 MG EC tablet Take 1 tablet (81 mg total) by mouth daily.   atorvastatin 40 MG tablet Commonly known as: LIPITOR Take 1 tablet (40 mg total) by mouth daily at 6 PM.   budesonide-formoterol 160-4.5 MCG/ACT inhaler Commonly known as: SYMBICORT Inhale 2 puffs into the lungs 2 (two) times daily.   diclofenac Sodium 1 % Gel Commonly known as: VOLTAREN Apply 2 g topically 4 (four) times daily. Use on elbow pain   diltiazem 180 MG 24 hr capsule Commonly known as: CARDIZEM CD Take 1 capsule (180 mg total) by mouth daily. Start taking on: July 10, 2020   fluticasone 50 MCG/ACT nasal spray Commonly known as: FLONASE Place 1 spray into both nostrils daily as needed for allergies or rhinitis.   furosemide 40 MG tablet Commonly known as: LASIX Take 1 tablet (40 mg total) by mouth daily. What changed: how much  to take   glipiZIDE 5 MG tablet Commonly known as: GLUCOTROL Take 0.5 tablets (2.5 mg total) by mouth 2 (two) times daily before a meal.   isosorbide-hydrALAZINE 20-37.5 MG tablet Commonly known as: BIDIL Take 1 tablet by mouth 3 (three) times daily.   Lokelma 5 g packet Generic drug: sodium zirconium cyclosilicate Take 5 g by mouth every other day for 2 doses. Start taking on: July 10, 2020   metoprolol tartrate 100 MG tablet Commonly known as: LOPRESSOR Take 1 tablet (100 mg total) by mouth 2 (two) times daily.   montelukast 10 MG tablet Commonly known as: SINGULAIR Take 1 tablet (10 mg total) by mouth at bedtime.       Vitals:   07/09/20 0349 07/09/20 0746  BP: 118/78 (!) 142/87  Pulse: 65 71  Resp: 20 19  Temp: (!) 97.4 F (36.3 C) 98.1 F (36.7 C)  SpO2: 98% 99%    Skin clean, dry and intact without evidence of skin break down, no evidence of skin tears noted. IV catheter discontinued intact. Site without signs and symptoms of complications. Dressing and pressure applied. Pt denies pain at this time. No complaints noted.  An After Visit Summary was printed and given to the patient. Patient escorted via [place], and D/C home via private auto.  [place]

## 2020-07-09 NOTE — Anesthesia Postprocedure Evaluation (Signed)
Anesthesia Post Note  Patient: ARMONIE STATEN  Procedure(s) Performed: TRANSESOPHAGEAL ECHOCARDIOGRAM (TEE) (N/A ) CARDIOVERSION (N/A )  Patient location during evaluation: Cath Lab Anesthesia Type: General Level of consciousness: awake and alert Pain management: pain level controlled Vital Signs Assessment: post-procedure vital signs reviewed and stable Respiratory status: spontaneous breathing, nonlabored ventilation, respiratory function stable and patient connected to nasal cannula oxygen Cardiovascular status: blood pressure returned to baseline and stable Postop Assessment: no apparent nausea or vomiting Anesthetic complications: no   No complications documented.   Last Vitals:  Vitals:   07/09/20 0349 07/09/20 0746  BP: 118/78 (!) 142/87  Pulse: 65 71  Resp: 20 19  Temp: (!) 36.3 C 36.7 C  SpO2: 98% 99%    Last Pain:  Vitals:   07/09/20 0746  TempSrc: Oral  PainSc:                  Precious Haws Annalysse Shoemaker

## 2020-07-09 NOTE — Progress Notes (Signed)
Mobility Specialist - Progress Note   07/09/20 1221  Mobility  Activity Ambulated in hall  Level of Assistance Independent  Assistive Device None  Distance Ambulated (ft) 500 ft  Mobility Response Tolerated well  Mobility performed by Mobility specialist  $Mobility charge 1 Mobility    Pt standing in hallway, requesting to ambulate outside of unit independently. Per nurse, pt okay to ambulate with author to monitor his O2 and HR. Pt independent t/o session. Pt ambulated 500' total this session. Pt slightly unsteady, but maintains his balance. Therefore, no LOB noted. During ambulation, pt expresses his frustration towards hospital. States "they think I'm a dummy, they make me feel like a dummy". Author asks pt what does he mean, and he doesn't explain. Pt curious to go off the floor. States he wants to go to [employer]. Author suggested it would be best to stay on the floor for safety. Author suggested ordering from the room. Pt states he has money, he doesn't need to order from the room. Upon returning to his unit (2A), Pryor Curia tells pt she can order lunch for him. Pt states he has already ordered lunch and requests for a cup of coffee. Nurse notified. Nurse states it's okay for pt to have coffee. Author provided pt coffee. Pt left standing by his room door w/ coffee in hand. Per discussion w/ nurse, pt is slightly psychologically unique. Pt was not able to follow certain commands, or engage in conversation during session.     Elaynah Virginia Mobility Specialist  07/09/20, 12:41 PM

## 2020-07-09 NOTE — Progress Notes (Addendum)
[facility] Encounter Note  Patient: Chris [name] / Admit Date: 07/03/2020 / Date of Encounter: 07/09/2020, 7:52 AM   Subjective: 1/23.  Patient overall feels relatively the same from yesterday without evidence of chest pain worsening shortness of breath dizziness nausea or diaphoresis.  The patient has had continued mild cough and congestion.  He does have what appears to be acute on chronic obstructive pulmonary disease exacerbation which has driven some hypoxia.  Lungs have been clear although had admitted to due to having lower extremity edema and abdominal edema which is improved after furosemide use.  Glomerular filtration rate continues to be reasonable at 45.  No evidence of myocardial infarction with troponin at 31.  The patient has developed acute atrial flutter with rapid ventricular rate overnight for which she has received a slight amount of metoprolol which is not particularly helped and will need adjustments of medication management for better heart rate control and possible electrical cardioversion to normal rhythm.  We have discussed at length this regimen and potential treatment including electrical cardioversion and all of its risks and benefits.  The patient understands well  1/24.  The patient has had continued atrial flutter with variable heart rate but much better controlled at this time with an average heart rate under 100 bpm.  This is accomplished by diltiazem drip and metoprolol combination.  We will try to change the diltiazem to oral medication at this time to control.  We have discussed at length transesophageal echocardiogram to assess for atrial appendage thrombus as well as electrical cardioversion due to concerns that atrial flutter responds best to cardioversion rather than other antiarrhythmics.  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  Patient has tolerated transesophageal echocardiogram and electrical cardioversion of atrial flutter into normal sinus rhythm without evidence of significant symptoms or side effect or complication.  Patient feels well at this time much better than before with significant improvement acute on chronic systolic dysfunction congestive heart failure and exacerbation of COPD.  We have used appropriate medication management for heart rate control of atrial flutter and now will use oral medications including beta-blocker and low-dose diltiazem to maintain normal sinus rhythm at this time.  In addition to that the patient has discussed some treatment with anticoagulation for further risk reduction in stroke with atrial fibrillation including Eliquis use.  1/26.  Patient feels much better breathing much better and no evidence of chest pain PND orthopnea syncope dizziness nausea or diaphoresis.  Patient has remained in normal sinus rhythm after electrical cardioversion.  No evidence of congestive heart failure symptoms at this time on current appropriate medication management.  Patient has had reinstatement of some medication management for his congestive heart failure for which he may be able to reinstate Entresto as an outpatient after patient has more stabilization of his creatinine  Review of Systems: Positive for: Shortness of breath cough congestion Negative for: Vision change, hearing change, syncope, dizziness, nausea, vomiting,diarrhea, bloody stool, stomach pain, positive for cough, congestion, negative for diaphoresis, urinary frequency, urinary pain,skin lesions, skin rashes Others previously listed  Objective: Telemetry: Normal sinus rhythm Physical Exam: Blood pressure (!) 142/87, pulse 71, temperature 98.1 F (36.7 C), temperature source Oral, resp. rate 19, height 6' (1.829 m), weight 116.9 kg, SpO2 99  %. Body mass index is 34.95 kg/m. General: Well  developed, well nourished, in no acute distress. Head: Normocephalic, atraumatic, sclera non-icteric, no xanthomas, nares are without discharge. Neck: No apparent masses Lungs: Normal respirations with few diffuse wheezes, some rhonchi, no rales , no crackles   Heart: Regular rate and rhythm, normal S1 S2, no murmur, no rub, no gallop, PMI is normal size and placement, carotid upstroke normal without bruit, jugular venous pressure normal Abdomen: Soft, non-tender, non-distended with normoactive bowel sounds. No hepatosplenomegaly. Abdominal aorta is normal size without bruit Extremities: Trace edema, no clubbing, no cyanosis, no ulcers,  Peripheral: 2+ radial, 2+ femoral, 2+ dorsal pedal pulses Neuro: Alert and oriented. Moves all extremities spontaneously. Psych:  Responds to questions appropriately with a normal affect.   Intake/Output Summary (Last 24 hours) at 07/09/2020 0752 Last data filed at 07/09/2020 [id number] Gross per 24 hour  Intake 1293.29 ml  Output 2725 ml  Net -1431.71 ml    Inpatient Medications:  . allopurinol  200 mg Oral Daily  . apixaban  5 mg Oral BID  . atorvastatin  40 mg Oral q1800  . budesonide (PULMICORT) nebulizer solution  0.5 mg Nebulization BID  . diltiazem  180 mg Oral Daily  . fluticasone  2 spray Each Nare Daily  . furosemide  40 mg Oral Daily  . insulin aspart  0-9 Units Subcutaneous TID WC  . ipratropium  0.5 mg Nebulization TID  . isosorbide-hydrALAZINE  1 tablet Oral TID  . levalbuterol  0.63 mg Nebulization TID  . loratadine  10 mg Oral Daily  . metoprolol tartrate  100 mg Oral BID  . montelukast  10 mg Oral QHS  . pantoprazole  40 mg Oral Q0600  . polyethylene glycol  17 g Oral BID  . senna-docusate  1 tablet Oral BID  . sodium chloride flush  3 mL Intravenous Q12H   Infusions:  . sodium chloride      Labs: Recent Labs    07/08/20 0419 07/09/20 0506  NA 136 135  K 4.7 5.4*  CL 96* 98   CO2 28 28  GLUCOSE 117* 113*  BUN 56* 60*  CREATININE 1.83* 2.14*  CALCIUM 8.9 9.3  MG 2.4 2.5*   No results for input(s): AST, ALT, ALKPHOS, BILITOT, PROT, ALBUMIN in the last 72 hours. Recent Labs    07/06/20 1118 07/07/20 0137 07/08/20 0419 07/09/20 0506  WBC 15.1*   < > 17.6* 16.3*  NEUTROABS 10.2*  --   --   --   HGB 15.7   < > 14.6 15.2  HCT 49.0   < > 45.8 47.0  MCV 91.8   < > 91.8 92.2  PLT 256   < > 255 247   < > = values in this interval not displayed.   No results for input(s): CKTOTAL, CKMB, TROPONINI in the last 72 hours. Invalid input(s): POCBNP No results for input(s): HGBA1C in the last 72 hours.   Weights: Filed Weights   07/03/20 2031 07/08/20 0500 07/09/20 0111  Weight: 122.9 kg 119.4 kg 116.9 kg     Radiology/Studies:  DG Chest 1 View  Result Date: 06/16/2020 CLINICAL DATA:  Chest pain and short of breath EXAM: CHEST  1 VIEW COMPARISON:  05/15/2020 FINDINGS: Cardiac enlargement without heart failure or edema. Hypoventilation with bibasilar atelectasis/infiltrate. No significant effusion. IMPRESSION: Hypoventilation with bibasilar atelectasis/infiltrate. Electronically Signed   By: Franchot Gallo M.D.   On: 06/16/2020 07:54   DG Chest 2 View  Result Date: 06/26/2020 CLINICAL DATA:  Shortness of breath. EXAM: CHEST -  2 VIEW COMPARISON:  06/16/2020 FINDINGS: The cardio pericardial silhouette is enlarged. There is pulmonary vascular congestion without overt pulmonary edema. Diffuse bilateral interstitial opacity suggest pulmonary edema. No substantial pleural effusion. The visualized bony structures of the thorax show no acute abnormality. IMPRESSION: Cardiomegaly with vascular congestion and probable interstitial edema. Electronically Signed   By: Misty Stanley M.D.   On: 06/26/2020 15:44   DG Chest Portable 1 View  Result Date: 07/03/2020 CLINICAL DATA:  Shortness of breath EXAM: PORTABLE CHEST 1 VIEW COMPARISON:  06/26/2020 FINDINGS: Cardiac shadow is  stable. Aortic calcifications are again seen. Lungs are well aerated bilaterally. Mild vascular congestion is again seen. Bibasilar atelectatic changes are noted. Artifact is noted overlying the anterior chest. No other focal abnormality is seen. IMPRESSION: Bibasilar atelectasis. Electronically Signed   By: Inez Catalina M.D.   On: 07/03/2020 20:44   ECHOCARDIOGRAM COMPLETE  Result Date: 06/17/2020    ECHOCARDIOGRAM REPORT   Patient Name:   Chris [name] Date of Exam: 06/17/2020 Medical Rec #:  [id number]       Height:       72.0 in Accession #:    [id number]      Weight:       264.4 lb Date of Birth:  1961-04-29       BSA:          2.399 m Patient Age:    74 years        BP:           111/80 mmHg Patient Gender: M               HR:           71 bpm. Exam Location:  Inpatient Procedure: 2D Echo, Cardiac Doppler and Color Doppler Indications:    CHF  History:        Patient has prior history of Echocardiogram examinations, most                 recent 12/25/2019. CHF, CAD, COPD, Signs/Symptoms:Chest Pain;                 Risk Factors:Hypertension, Diabetes, Obesity and Current Smoker.  Sonographer:    Dustin Flock Referring Phys: [id number] [place]  1. Left ventricular ejection fraction, by estimation, is 20 to 25%. The left ventricle has severely decreased function. The left ventricle demonstrates global hypokinesis. The left ventricular internal cavity size was mildly to moderately dilated. There  is mild concentric left ventricular hypertrophy. Left ventricular diastolic parameters are consistent with Grade III diastolic dysfunction (restrictive). Elevated left atrial pressure.  2. Right ventricular systolic function is moderately reduced. The right ventricular size is moderately enlarged. There is mildly elevated pulmonary artery systolic pressure. The estimated right ventricular systolic pressure is 123XX123 mmHg.  3. Left atrial size was mildly dilated.  4. Right atrial size was mildly  dilated.  5. The mitral valve is grossly normal. Mild mitral valve regurgitation. No evidence of mitral stenosis.  6. The aortic valve is tricuspid. Aortic valve regurgitation is not visualized. No aortic stenosis is present.  7. The inferior vena cava is dilated in size with <50% respiratory variability, suggesting right atrial pressure of 15 mmHg. Comparison(s): Changes from prior study are noted. EF reduced to 20-25%. Moderately dilated RV with moderately reduced function. Grade 3 DD with elevated RVSP. FINDINGS  Left Ventricle: Left ventricular ejection fraction, by estimation, is 20 to 25%. The left ventricle has severely decreased function. The  left ventricle demonstrates global hypokinesis. The left ventricular internal cavity size was mildly to moderately dilated. There is mild concentric left ventricular hypertrophy. Left ventricular diastolic parameters are consistent with Grade III diastolic dysfunction (restrictive). Elevated left atrial pressure. Right Ventricle: The right ventricular size is moderately enlarged. No increase in right ventricular wall thickness. Right ventricular systolic function is moderately reduced. There is mildly elevated pulmonary artery systolic pressure. The tricuspid regurgitant velocity is 2.62 m/s, and with an assumed right atrial pressure of 15 mmHg, the estimated right ventricular systolic pressure is 41.3 mmHg. Left Atrium: Left atrial size was mildly dilated. Right Atrium: Right atrial size was mildly dilated. Pericardium: Trivial pericardial effusion is present. Mitral Valve: The mitral valve is grossly normal. Mild mitral valve regurgitation. No evidence of mitral valve stenosis. Tricuspid Valve: The tricuspid valve is grossly normal. Tricuspid valve regurgitation is trivial. No evidence of tricuspid stenosis. Aortic Valve: The aortic valve is tricuspid. Aortic valve regurgitation is not visualized. No aortic stenosis is present. Pulmonic Valve: The pulmonic valve was  grossly normal. Pulmonic valve regurgitation is not visualized. No evidence of pulmonic stenosis. Aorta: The aortic root is normal in size and structure. Venous: The inferior vena cava is dilated in size with less than 50% respiratory variability, suggesting right atrial pressure of 15 mmHg. IAS/Shunts: The atrial septum is grossly normal.  LEFT VENTRICLE PLAX 2D LVIDd:         6.50 cm  Diastology LVIDs:         5.70 cm  LV e' medial:    3.05 cm/s LV PW:         1.50 cm  LV E/e' medial:  29.6 LV IVS:        1.30 cm  LV e' lateral:   4.24 cm/s LVOT diam:     3.00 cm  LV E/e' lateral: 21.3 LV SV:         115 LV SV Index:   48 LVOT Area:     7.07 cm  RIGHT VENTRICLE RV Basal diam:  4.80 cm RV S prime:     14.60 cm/s TAPSE (M-mode): 2.1 cm LEFT ATRIUM              Index       RIGHT ATRIUM           Index LA diam:        4.60 cm  1.92 cm/m  RA Area:     35.10 cm LA Vol (A2C):   103.0 ml 42.94 ml/m RA Volume:   149.00 ml 62.11 ml/m LA Vol (A4C):   86.8 ml  36.18 ml/m LA Biplane Vol: 99.1 ml  41.31 ml/m  AORTIC VALVE LVOT Vmax:   97.50 cm/s LVOT Vmean:  60.600 cm/s LVOT VTI:    0.163 m  AORTA Ao Root diam: 3.80 cm MITRAL VALVE               TRICUSPID VALVE MV Area (PHT): 3.42 cm    TR Peak grad:   27.5 mmHg MV Decel Time: 222 msec    TR Vmax:        262.00 cm/s MV E velocity: 90.40 cm/s MV A velocity: 31.70 cm/s  SHUNTS MV E/A ratio:  2.85        Systemic VTI:  0.16 m                            Systemic Diam: 3.00 cm [place]  O'Neal MD Electronically signed by Eleonore Chiquito MD Signature Date/Time: 06/17/2020/4:57:34 PM    Final      Assessment and Recommendation  60 y.o. male with known dilated cardiomyopathy with acute on chronic systolic dysfunction congestive heart failure secondary to cough and congestion and COPD with hypertension hyperlipidemia without evidence of myocardial infarction but new onset atrial flutter with rapid ventricular rate now converted into normal sinus rhythm without evidence of  complication now with some reinstatement of appropriate medication management and tolerating well 1.  Continuation of combination of diltiazem and metoprolol to maintain normal sinus rhythm and help with blood pressure 2.  Anticoagulation for further risk reduction in stroke with atrial fibrillation using Eliquis 5 mg twice per day  3.  Reinstatement of isosorbide hydralazine combination that may hold off on Entresto reintroduction due to glomerular filtration rate slightly reduced at this time.  Will consider reintroduction as outpatient  4.  No further cardiac diagnostics necessary at this time due to improvements of patient 5.  Begin ambulation and cardiac rehabilitation.  The patient is improved from cardiovascular standpoint ambulating well with above adjustments medication would be okay for him discharged home with follow-up next week.  Signed, [name] M.D. FACC

## 2020-07-09 NOTE — Progress Notes (Signed)
Patient observed in hallway posting note to door for this nurse to not enter his room due to feeling unsafe with my care. SR noted on telemetry.

## 2020-07-09 NOTE — Progress Notes (Signed)
Patient out in hallway without mask.  Encouraged to wear mask  When out of room.  Continues to have frequent requests for food and liquids.  Informed of Carb modified and heart healthy diet.

## 2020-07-09 NOTE — Progress Notes (Signed)
   Heart Failure Nurse Navigator Note  HFrEF 20-25%.  Mild concentric LVH.  Grade II diastolic dysfunction.  Mild bi atrial enlargement.  Elevated right ventricular systolic pressure.  He presented to the ED with complaints of increasing shortness of breath, chest tightness and cough. O2 saturations in the field were in the 70s.  Comorbidities:  COPD Chronic kidney disease stage III Depression Hypertension Hyperlipidemia Gout Diabetes Obstructive sleep apnea    Medications:  Eliquis 5 mg twice daily Atorvastatin 40 mg daily Diltiazem 180 mg daily Furosemide 40 mg daily Isosorbide/hydralazine 20/37.5 mg-1 tablet 3 times a day Metoprolol tartrate 100 mg 2 times a day  Labs:  Sodium 135, potassium 5.4, chloride 98, CO2 28, BUN 60 up from 56 of yesterday, creatinine 2.14 up from 1.38 of yesterday, magnesium 2.5 Intake 1293 mL Output 2325 mL Weight is 116.9 kg, down from 119.4 kg Blood pressure 142/87   Assessment;  General-he is awake and alert sitting up in the chair at bedside, in no acute distress.  HEENT-pupils are equal, no JVD  Cardiac-heart tones of regular rate and rhythms.  Chest-breath sounds are clear to posterior auscultation.  Abdomen-rounded soft non tender.  Musculoskeletal-lower extremities no edema noted.  Psych-initially was pleasant and answered questions but would go off on tangents stating that he feels he is being lied to and it was given a medication that he should not have.  Redirected-he continues to be pleasant.  Neuro-speech is clear, moves all extremities without difficulty.   Follow-up to initial visit when he was seen in the emergency room.    He states that he has a scale and will be able to weigh himself daily.  He states that he has been researching and looking for recipes that would be good for him.  He states he is ready to be discharged home.  Asked that he wait and talk to the doctor as to why he should not be discharged  today, to listen to what he has to say.    Pricilla Riffle RN,CHFN

## 2020-07-09 NOTE — Patient Instructions (Signed)
Visit Information  Mr. Siemon was given information about Medicaid Managed Care team care coordination services as a part of their [facility] Medicaid benefit. AVEION NGUYEN verbally consented to engagement with the [facility] Managed Care team.   For questions related to your Healthy [facility] health plan, please call: [phone] or visit the homepage here: [email or web address]  If you would like to schedule transportation through your Healthy [facility] plan, please call the following number at least 2 days in advance of your appointment: [phone]  Mr. Marcello - following are the goals we discussed in your visit today:  Goals Addressed   None       Social Worker will continue to follow up with patient.   Ethelda Chick  Following is a copy of your plan of care:  Patient Care Plan: Heart Failure (Adult)    Problem Identified: Symptom Exacerbation (Heart Failure)     Goal: Symptom Exacerbation Prevented or Minimized   Start Date: 07/02/2020  Expected End Date: 09/01/2020  This Visit's Progress: On track  Priority: High  Note:   Current Barriers:  . Debbie [facility]  Nurse Case Manager Clinical Goal(s):  [name] Over the next 30 days, patient will work with Jackelyn Poling [facility] to address needs related to housing . Over the next 30 days, patient will meet with RN Care Manager to address needs related to managing CHF and DMII . Over the next 30 days, patient will demonstrate improved health management independence as evidenced bytaking prescribed medications, reporting any changes in symptoms to PCP such as swelling, changes in breathing, new cough  Interventions:  . Inter-disciplinary care team collaboration (see longitudinal plan of care) . Evaluation of current treatment plan related to CHF and DMII and patient's adherence to plan as established by provider. . Advised patient to  follow up with PCP with any concerns or changes in symptoms . Reviewed medications with patient and discussed that Mr Salton has one prescription that was unable to be filled yesterday when he picked up his other medications. RNCM called pharmacy and entresto is ready to be picked up. Mr Saleeby aware and plans to get it today . Collaborated with [name] regarding housing need . Discussed plans with patient for ongoing care management follow up and provided patient with direct contact information for care management team . Advised patient, providing education and rationale, to weigh daily and record, calling PCP for weight gain of 3lbs overnight or 5 pounds in a week.  [name] Pharmacy referral for medication review  Patient Goals/Self-Care Activities Over the next 30 days, patient will: -Call to fill prescriptions one week before I run out of medication -Take all medications as prescribed - call office if I gain more than 2 pounds in one day or 5 pounds in one week - use salt in moderation - watch for swelling in feet, ankles and legs every day  - eat more whole grains, fruits and vegetables, lean meats and healthy fats - know when to call the doctor - track symptoms and what helps feel better or worse - dress right for the weather, hot or cold   Follow Up Plan: Telephone follow up appointment with Managed Medicaid care management team member scheduled for:07/30/20 @ 2:30pm      Patient Care Plan: General Social Work (Adult)    Problem Identified: Homelessness   Onset Date: 07/09/2020  Note:   GARLAND HINCAPIE is a 60 y.o. year old male who sees Kathryn, Linarez,  MD for primary care. The  Medicaid Managed Care team was consulted for assistance with Housing barriers. Mr. Saldivar was given information about Care Management services, agreed to services, and verbal consent for services was obtained.  Interventions:  . Patient interviewed and appropriate assessments performed . Collaborated with  clinical team regarding patient needs  . SDOH (Social Determinants of Health) assessments performed: Yes .     [name] Provided patient with information about the [facility]. [name] contacted [facility] to complete a referral for patient to get into the homeless motel. [name] left a voicemail. [name] informed patient that the process can take a long time. Patient states he does not know why no one wants to help him. [name] informed patient he may be able to go to a shelter in [facility], patient states he does not feel safe in [place].  [name] contacted [employer] in [place] [phone] and left a voicemail for the intake coordinator. . [name] contacted [place] to speak with a Education officer, museum but kept getting transferred.  . Patient states he does not have any money to get a room and become frustrated again stating that we "[facility]"do not want to help him. [name] informed patient that she will wait for the [facility] and/or the [facility] to contact her back. [name] informed patient she could provide him with the information for [facility] but patient did not want to call.  Plan:  . Over the next 30-60 days, patient will work with [name] to address needs related to Housing barriers . Social Worker will continue to follow up with patient.   [name], [name], [place]  High Risk Managed Medicaid Team

## 2020-07-09 NOTE — Discharge Summary (Signed)
Physician Discharge Summary  Patient ID: Chris [name] MRN: [id number] DOB/AGE: [birth year] 60 y.o.  Admit date: 07/03/2020 Discharge date: 07/09/2020  Admission Diagnoses:  Discharge Diagnoses:  Principal Problem:   Acute and chronic respiratory failure (acute-on-chronic) (HCC) Active Problems:   Chronic systolic heart failure (HCC)   Mixed hyperlipidemia   Essential hypertension   CKD (chronic kidney disease) stage 3, GFR 30-59 ml/min (HCC)   Tobacco dependence   History of gout   Acute on chronic combined systolic and diastolic CHF (congestive heart failure) (HCC)   Type 2 diabetes mellitus with stage 3 chronic kidney disease (HCC)   COPD with acute exacerbation (HCC)   Acute respiratory failure with hypoxia (HCC)   Typical atrial flutter (HCC)   COPD exacerbation ([place])   Discharged Condition: good  Hospital Course:  Chris Cartlidge [name] a 60 y.o.malewith medical history significant for diastolic and systolic heart failure, COPD on chronic oxygen, CKD 3, depression, hypertension, hyperlipidemia, gout, diabetes who presents with chest pain and worsening shortness of breat. Per chart in the [place] sat was in the 70s and pt was switched over to CPAP. Patient had note has interim history of recent admission 06/16/2020-06/18/2020 at which time patient was diagnosed with Acute CHF exacerbation with acute hypoxic respiratory , patient was unfortunately at that time discharged AMA. He then presented to ED on 1/13 with sob/wheezing from jail he was found to have acute CHF he was treated in ed and lasix home dosage was increased and he was discharged to outpatient care with a course of steroids as well.  He now returns with similar complaint of acute onset of sob, wheezing, chest tightness x for less than one day.He also has associated complaints of cough, as well as tenderness of abdomen s/p coughing episodes.  #1.  Acute hypoxemic respiratory failure secondary to acute on chronic systolic  congestive heart failure. Acute on chronic combined systolic and diastolic congestive heart failure. New onset atrial flutter. COPD exacerbation. She had echocardiogram showed ejection fraction 20 to 25% with global hypokinesia.  He was seen by cardiology, he was placed on IV Lasix, he was diuresed, he has a -14 L of fluid level. Patient was initially placed on BiPAP, currently off oxygen on room air. Patient has been seen by cardiology, medication has been adjusted, Entresto was discontinued by cardiology, patient was placed on higher dose of metoprolol, he is also started on diltiazem to control heart rate. At this point, patient condition has improved, he was able to walk in the hallway without secondary short of breath.  He is medically stable to be discharged. Patient is advised to [place] low-salt diet with fluid restriction.  2.  COPD exacerbation. Patient received Solu-Medrol, currently no longer has any bronchospasm. I will discontinue steroids.  3.  Chronic kidney stage IIIb. Hyperkalemia. Renal function slightly worse today, patient does not wish to stay in the hospital.  Diuretic dose was decreased by cardiology. Potassium 5.4 today, he will give a dose of Lokelma.  I will continue 5 g every 48 hours for 2 doses.  Patient be followed by family doctor in the near future, need to check a BMP in 5-7 days.  #4.  Elevated lactic acid level. No evidence of bacterial infection.  5.  Hypertension. Blood pressure is better controlled.  6.  Obstructive sleep apnea.  Continue CPAP at night.     Consults: cardiology  Significant Diagnostic Studies:  Echo: 1. Left ventricular ejection fraction, by estimation, is 20 to 25%. The  left ventricle has severely decreased function. The left ventricle demonstrates global hypokinesis. The left ventricular internal cavity size was moderately dilated. Left ventricular diastolic parameters were normal. 2. Right ventricular systolic function is  normal. The right ventricular size is normal. 3. Left atrial size was moderately dilated. No left atrial/left atrial appendage thrombus was detected. 4. Right atrial size was moderately dilated. 5. The mitral valve is normal in structure. Moderate mitral valve regurgitation. 6. Tricuspid valve regurgitation is moderate. 7. The aortic valve is normal in structure. Aortic valve regurgitation is not visualized. 8. There is Moderate (Grade III) plaque. 9. Agitated saline contrast bubble study was negative, with no evidence of any interatrial shunt.  Treatments: Diuretics,   Discharge Exam: Blood pressure (!) 142/87, pulse 71, temperature 98.1 F (36.7 C), temperature source Oral, resp. rate 19, height 6' (1.829 m), weight 116.9 kg, SpO2 99 %. General appearance: alert and cooperative Resp: clear to auscultation bilaterally Cardio: regular rate and rhythm, S1, S2 normal, no murmur, click, rub or gallop GI: soft, non-tender; bowel sounds normal; no masses,  no organomegaly Extremities: extremities normal, atraumatic, no cyanosis or edema  Disposition: Discharge disposition: 01-Home or Self Care       Discharge Instructions    Diet - low sodium heart healthy   Complete by: As directed    Fluid lee than 1800 ml/day   Increase activity slowly   Complete by: As directed      Allergies as of 07/09/2020   No Known Allergies     Medication List    STOP taking these medications   predniSONE 20 MG tablet Commonly known as: DELTASONE   sacubitril-valsartan 24-26 MG Commonly known as: ENTRESTO     TAKE these medications   albuterol 108 (90 Base) MCG/ACT inhaler Commonly known as: VENTOLIN HFA Inhale 2 puffs into the lungs every 6 (six) hours as needed for wheezing or shortness of breath.   albuterol (2.5 MG/3ML) 0.083% nebulizer solution Commonly known as: PROVENTIL Take 3 mLs by nebulization every 6 (six) hours as needed for shortness of breath.   allopurinol 100 MG  tablet Commonly known as: ZYLOPRIM Take 2 tablets (200 mg total) by mouth daily.   apixaban 5 MG Tabs tablet Commonly known as: ELIQUIS Take 1 tablet (5 mg total) by mouth 2 (two) times daily.   aspirin 81 MG EC tablet Take 1 tablet (81 mg total) by mouth daily.   atorvastatin 40 MG tablet Commonly known as: LIPITOR Take 1 tablet (40 mg total) by mouth daily at 6 PM.   budesonide-formoterol 160-4.5 MCG/ACT inhaler Commonly known as: SYMBICORT Inhale 2 puffs into the lungs 2 (two) times daily.   carvedilol 25 MG tablet Commonly known as: COREG Take 1 tablet (25 mg total) by mouth 2 (two) times daily with a meal.   diclofenac Sodium 1 % Gel Commonly known as: VOLTAREN Apply 2 g topically 4 (four) times daily. Use on elbow pain   diltiazem 180 MG 24 hr capsule Commonly known as: CARDIZEM CD Take 1 capsule (180 mg total) by mouth daily. Start taking on: July 10, 2020   fluticasone 50 MCG/ACT nasal spray Commonly known as: FLONASE Place 1 spray into both nostrils daily as needed for allergies or rhinitis.   furosemide 40 MG tablet Commonly known as: LASIX Take 1 tablet (40 mg total) by mouth daily. What changed: how much to take   glipiZIDE 5 MG tablet Commonly known as: GLUCOTROL Take 0.5 tablets (2.5 mg total) by mouth 2 (  two) times daily before a meal.   isosorbide-hydrALAZINE 20-37.5 MG tablet Commonly known as: BIDIL Take 1 tablet by mouth 3 (three) times daily.   Lokelma 5 g packet Generic drug: sodium zirconium cyclosilicate Take 5 g by mouth every other day for 2 doses. Start taking on: July 10, 2020   metoprolol tartrate 100 MG tablet Commonly known as: LOPRESSOR Take 1 tablet (100 mg total) by mouth 2 (two) times daily.   montelukast 10 MG tablet Commonly known as: SINGULAIR Take 1 tablet (10 mg total) by mouth at bedtime.       Follow-up Information    [facility] Follow up on 07/17/2020.    Specialty: Cardiology Why: at 1:00pm. Enter through the [place] entrance Contact information: [address] [place], [place], [place] Follow up in 1 week(s).   Specialties: Cardiology, Internal Medicine Contact information: [place] [address] [phone]        [name], MD Follow up in 1 week(s).   Specialty: Internal Medicine Contact information: [place] [address] [phone]        Donato Heinz, MD .   Specialties: Cardiology, Radiology Contact information: [address] [place] [address] [phone]              36 minutes Signed: [name] 07/09/2020, 2:29 PM

## 2020-07-09 NOTE — Telephone Encounter (Signed)
CANCEL 

## 2020-07-10 ENCOUNTER — Ambulatory Visit: Payer: Medicaid Other | Attending: Internal Medicine | Admitting: Internal Medicine

## 2020-07-10 ENCOUNTER — Other Ambulatory Visit: Payer: Self-pay

## 2020-07-10 ENCOUNTER — Encounter: Payer: Self-pay | Admitting: Internal Medicine

## 2020-07-10 ENCOUNTER — Other Ambulatory Visit: Payer: Self-pay | Admitting: Internal Medicine

## 2020-07-10 VITALS — BP 160/100 | HR 72 | Temp 97.7°F | Resp 16 | Wt 254.4 lb

## 2020-07-10 DIAGNOSIS — E1169 Type 2 diabetes mellitus with other specified complication: Secondary | ICD-10-CM | POA: Diagnosis not present

## 2020-07-10 DIAGNOSIS — N1832 Chronic kidney disease, stage 3b: Secondary | ICD-10-CM

## 2020-07-10 DIAGNOSIS — Z09 Encounter for follow-up examination after completed treatment for conditions other than malignant neoplasm: Secondary | ICD-10-CM

## 2020-07-10 DIAGNOSIS — I10 Essential (primary) hypertension: Secondary | ICD-10-CM | POA: Diagnosis not present

## 2020-07-10 DIAGNOSIS — F1410 Cocaine abuse, uncomplicated: Secondary | ICD-10-CM

## 2020-07-10 DIAGNOSIS — M1000 Idiopathic gout, unspecified site: Secondary | ICD-10-CM

## 2020-07-10 DIAGNOSIS — E669 Obesity, unspecified: Secondary | ICD-10-CM | POA: Diagnosis not present

## 2020-07-10 DIAGNOSIS — F17200 Nicotine dependence, unspecified, uncomplicated: Secondary | ICD-10-CM

## 2020-07-10 DIAGNOSIS — I5042 Chronic combined systolic (congestive) and diastolic (congestive) heart failure: Secondary | ICD-10-CM

## 2020-07-10 LAB — GLUCOSE, POCT (MANUAL RESULT ENTRY): POC Glucose: 98 mg/dl (ref 70–99)

## 2020-07-10 MED ORDER — SODIUM POLYSTYRENE SULFONATE 15 GM/60ML PO SUSP: 15.0000 g | Freq: Once | ORAL | 0 refills | Status: DC

## 2020-07-10 MED ORDER — APIXABAN 5 MG PO TABS: 5.0000 mg | ORAL_TABLET | Freq: Two times a day (BID) | ORAL | 3 refills | Status: DC

## 2020-07-10 MED ORDER — FUROSEMIDE 40 MG PO TABS: 40.0000 mg | ORAL_TABLET | Freq: Every day | ORAL | 2 refills | Status: DC

## 2020-07-10 MED FILL — SPS 15 GM/60 ML SUSPENSION: 15 | 1 days supply | Qty: 60 | Fill #0

## 2020-07-10 NOTE — Progress Notes (Signed)
Patient ID: KENDALE PLUCINSKI, male    DOB: [birth year]  MRN: [id number]  CC: Hospitalization Follow-up   Subjective: Chantz Lahaie is a 60 y.o. male who presents for hosp f/u His concerns today include:  Pt with hx ofNICM, combined CHF,HTN, DM type 2, HL,CKD 3b,gout, tob dep,subst abuse (cocaine), COPD, chronic resp failure with hypoxia on home O2,. OSA  Patient hospitalized several times since we last spoke. Hospitalized 1/3-10/2020 with CHF exacerbation. On that visit he was found to have a decline in his EF from 30 to 35% down to 20 to 25%. Patient left AMA.  Hospitalized 1/20-26/2022 with acute on chronic hypoxic respiratory failure secondary to CHF exacerbation. He was also found to have new onset atrial flutter and COPD exacerbation. He was placed on BiPAP and diuresed appropriately. Sherryll Burger was discontinued. Patient placed on metoprolol and was also started on diltiazem to control heart rate. Kidney function slightly worsened. Diuretic dose was decreased to Lasix 40 mg daily. On discharge his potassium level was 5.4. He was given a dose of Lokelma. Plan was to continue 5 g of Lokelma every 48 hours for 2 doses. He was advised to continue using his CPAP at nights.  Today: Patient reports that his breathing is much better. He just picked up his medicines today and has not taken anything as yet for the morning. He brought his medicines with him so that we can do medication reconciliation. He has bottles of metoprolol and carvedilol. -Denies any lower extremity edema, PND orthopnea. He sleeps on 3 pillows. -He tells me that he has quit cocaine ever since his hospitalization earlier this month. -Denies any bruising or bleeding on Eliquis. He does not have a follow-up appointment scheduled with the cardiologist as yet.  Complains of pain in the right ankle which she attributes to flare in gout. This started about 2 days ago.  Tobacco dependence: He states that he has cut down  significantly and plans to quit. He used to smoke 1 pack a day he is now down to 1 pack lasting 1-1/2 weeks. Patient Active Problem List   Diagnosis Date Noted  . COPD exacerbation (HCC)   . Typical atrial flutter (HCC)   . Acute respiratory failure with hypoxia (HCC) 07/05/2020  . CHF (congestive heart failure) (HCC) 07/04/2020  . Acute exacerbation of CHF (congestive heart failure) (HCC) 06/16/2020  . COPD with acute exacerbation (HCC) 06/16/2020  . Influenza vaccine refused 05/06/2020  . Acute on chronic combined systolic (congestive) and diastolic (congestive) heart failure (HCC) 05/05/2020  . Acute decompensated heart failure (HCC) 05/04/2020  . Illiteracy 05/04/2020  . Chronic respiratory failure with hypoxia, on home oxygen therapy (HCC) 12/28/2019  . Type 2 diabetes mellitus with stage 3 chronic kidney disease (HCC) 12/25/2019  . Acute and chronic respiratory failure (acute-on-chronic) (HCC) 12/25/2019  . Acute on chronic combined systolic and diastolic CHF (congestive heart failure) (HCC) 10/26/2019  . Elevated troponin I level 10/26/2019  . Acute on chronic diastolic (congestive) heart failure (HCC) 10/26/2019  . History of gout 02/01/2019  . Seasonal allergic rhinitis due to pollen 02/01/2019  . Tobacco dependence 11/30/2018  . Microscopic hematuria 11/30/2018  . Depression 11/30/2018  . Difficulty controlling anger 11/30/2018  . CAP (community acquired pneumonia) 08/11/2018  . COPD (chronic obstructive pulmonary disease) (HCC)   . CKD (chronic kidney disease) stage 3, GFR 30-59 ml/min (HCC) 08/10/2018  . Recurrent epistaxis 04/21/2018  . Mixed hyperlipidemia 07/28/2017  . Essential hypertension 07/28/2017  . Chronic systolic  heart failure ([place]) 10/25/2014  . Cocaine abuse ([place]) 02/20/2013  . Cannabis abuse 02/20/2013  . Back pain, chronic 02/20/2013     Current Outpatient Medications on File Prior to Visit  Medication Sig Dispense Refill  . albuterol (PROVENTIL)  (2.5 MG/3ML) 0.083% nebulizer solution Take 3 mLs by nebulization every 6 (six) hours as needed for shortness of breath. 90 mL 1  . albuterol (VENTOLIN HFA) 108 (90 Base) MCG/ACT inhaler Inhale 2 puffs into the lungs every 6 (six) hours as needed for wheezing or shortness of breath. 8.5 g 2  . allopurinol (ZYLOPRIM) 100 MG tablet Take 2 tablets (200 mg total) by mouth daily. 60 tablet 4  . aspirin 81 MG EC tablet Take 1 tablet (81 mg total) by mouth daily. 30 tablet 2  . atorvastatin (LIPITOR) 40 MG tablet Take 1 tablet (40 mg total) by mouth daily at 6 PM. 30 tablet 2  . budesonide-formoterol (SYMBICORT) 160-4.5 MCG/ACT inhaler Inhale 2 puffs into the lungs 2 (two) times daily. 1 each 2  . diclofenac Sodium (VOLTAREN) 1 % GEL Apply 2 g topically 4 (four) times daily. Use on elbow pain 50 g 3  . diltiazem (CARDIZEM CD) 180 MG 24 hr capsule Take 1 capsule (180 mg total) by mouth daily. 30 capsule 0  . fluticasone (FLONASE) 50 MCG/ACT nasal spray Place 1 spray into both nostrils daily as needed for allergies or rhinitis. 16 g 2  . glipiZIDE (GLUCOTROL) 5 MG tablet Take 0.5 tablets (2.5 mg total) by mouth 2 (two) times daily before a meal. 30 tablet 4  . isosorbide-hydrALAZINE (BIDIL) 20-37.5 MG tablet Take 1 tablet by mouth 3 (three) times daily. 90 tablet 4  . metoprolol tartrate (LOPRESSOR) 100 MG tablet Take 1 tablet (100 mg total) by mouth 2 (two) times daily. 60 tablet 0  . montelukast (SINGULAIR) 10 MG tablet Take 1 tablet (10 mg total) by mouth at bedtime. 30 tablet 2  . sodium zirconium cyclosilicate (LOKELMA) 5 g packet Take 5 g by mouth every other day for 2 doses. 2 packet 0   No current facility-administered medications on file prior to visit.    No Known Allergies  Social History   Socioeconomic History  . Marital status: Single    Spouse name: Not on file  . Number of children: Not on file  . Years of education: Not on file  . Highest education level: Not on file  Occupational  History  . Not on file  Tobacco Use  . Smoking status: Current Every Day Smoker    Packs/day: 1.00    Years: 20.00    Pack years: 20.00    Types: Cigarettes  . Smokeless tobacco: Never Used  . Tobacco comment: less than 1 pack per day  Vaping Use  . Vaping Use: Never used  Substance and Sexual Activity  . Alcohol use: No  . Drug use: Yes    Frequency: 21.0 times per week    Types: Marijuana, Cocaine    Comment: no longer- Cocaine  . Sexual activity: Not on file  Other Topics Concern  . Not on file  Social History Narrative   ** Merged History Encounter **       Social Determinants of Health   Financial Resource Strain: Not on file  Food Insecurity: Food Insecurity Present  . Worried About Charity fundraiser in the Last Year: Often true  . Ran Out of Food in the Last Year: Often true  Transportation Needs: Unmet  Transportation Needs  . Lack of Transportation (Medical): Yes  . Lack of Transportation (Non-Medical): Yes  Physical Activity: Not on file  Stress: Not on file  Social Connections: Not on file  Intimate Partner Violence: Not on file    Family History  Problem Relation Age of Onset  . Heart disease Father   . Diabetes Mother   . HIV Brother   . Healthy Son   . Healthy Daughter     Past Surgical History:  Procedure Laterality Date  . ANKLE SURGERY    . CARDIAC CATHETERIZATION    . CARDIOVERSION N/A 07/08/2020   Procedure: CARDIOVERSION;  Surgeon: Corey Skains, MD;  Location: [facility] ORS;  Service: Cardiovascular;  Laterality: N/A;  . HERNIA REPAIR     x2  . SHOULDER SURGERY    . TEE WITHOUT CARDIOVERSION N/A 07/08/2020   Procedure: TRANSESOPHAGEAL ECHOCARDIOGRAM (TEE);  Surgeon: Corey Skains, MD;  Location: [facility] ORS;  Service: Cardiovascular;  Laterality: N/A;    ROS: Review of Systems Negative except as stated above  PHYSICAL EXAM: BP (!) 160/100   Pulse 72   Temp 97.7 F (36.5 C)   Resp 16   Wt 254 lb 6.4 oz (115.4 kg)   SpO2 97%    BMI 34.50 kg/m   Wt Readings from Last 3 Encounters:  07/10/20 254 lb 6.4 oz (115.4 kg)  07/09/20 257 lb 11.2 oz (116.9 kg)  06/18/20 268 lb 8.3 oz (121.8 kg)    Physical Exam  General appearance - alert, well appearing, obese middle-aged African-American male and in no distress. He is sitting in a wheelchair. Mental status - normal mood, behavior, speech, dress, motor activity, and thought processes Neck - supple, no significant adenopathy Chest - clear to auscultation, no wheezes, rales or rhonchi, symmetric air entry Heart - normal rate, regular rhythm, normal S1, S2, no murmurs, rubs, clicks or gallops Musculoskeletal -ankle: Mild edema and mild tenderness to touch and with attempted passive movement. No erythema. Extremities -no edema in the lower legs. Lab Results  Component Value Date   HGBA1C 6.4 (H) 07/05/2020    CMP Latest Ref Rng & Units 07/09/2020 07/08/2020 07/07/2020  Glucose 70 - 99 mg/dL 113(H) 117(H) 119(H)  BUN 6 - 20 mg/dL 60(H) 56(H) 53(H)  Creatinine 0.61 - 1.24 mg/dL 2.14(H) 1.83(H) 2.00(H)  Sodium 135 - 145 mmol/L 135 136 136  Potassium 3.5 - 5.1 mmol/L 5.4(H) 4.7 5.4(H)  Chloride 98 - 111 mmol/L 98 96(L) 99  CO2 22 - 32 mmol/L 28 28 24   Calcium 8.9 - 10.3 mg/dL 9.3 8.9 9.0  Total Protein 6.5 - 8.1 g/dL - - -  Total Bilirubin 0.3 - 1.2 mg/dL - - -  Alkaline Phos 38 - 126 U/L - - -  AST 15 - 41 U/L - - -  ALT 0 - 44 U/L - - -   Lipid Panel     Component Value Date/Time   CHOL 176 12/25/2019 0632   CHOL 160 11/07/2019 0918   CHOL 157 10/21/2013 0435   TRIG 91 12/25/2019 0632   TRIG 117 10/21/2013 0435   HDL 37 (L) 12/25/2019 0632   HDL 40 11/07/2019 0918   HDL 30 (L) 10/21/2013 0435   CHOLHDL 4.8 12/25/2019 0632   VLDL 18 12/25/2019 0632   VLDL 23 10/21/2013 0435   LDLCALC 121 (H) 12/25/2019 0632   LDLCALC 92 11/07/2019 0918   LDLCALC 104 (H) 10/21/2013 0435    CBC    Component Value  Date/Time   WBC 16.3 (H) 07/09/2020 0506   RBC 5.10  07/09/2020 0506   HGB 15.2 07/09/2020 0506   HGB 15.4 11/07/2019 0918   HCT 47.0 07/09/2020 0506   HCT 46.2 11/07/2019 0918   PLT 247 07/09/2020 0506   PLT 245 11/07/2019 0918   MCV 92.2 07/09/2020 0506   MCV 90 11/07/2019 0918   MCV 92 10/21/2013 0435   MCH 29.8 07/09/2020 0506   MCHC 32.3 07/09/2020 0506   RDW 14.2 07/09/2020 0506   RDW 14.2 11/07/2019 0918   RDW 15.7 (H) 10/21/2013 0435   LYMPHSABS 3.6 07/06/2020 1118   LYMPHSABS 1.1 10/21/2013 0435   MONOABS 1.0 07/06/2020 1118   MONOABS 0.6 10/21/2013 0435   EOSABS 0.2 07/06/2020 1118   EOSABS 0.0 10/21/2013 0435   BASOSABS 0.1 07/06/2020 1118   BASOSABS 0.0 10/21/2013 0435    ASSESSMENT AND PLAN: 1. Hospital discharge follow-up I spent some time doing medication reconciliation and getting rid of medications that he should no longer be on including the carvedilol.  2. Chronic combined systolic and diastolic CHF (congestive heart failure) (HCC) Encourage compliance with medications. Stressed the importance of staying away from cocaine and other street drugs. Needs follow-up appointment with heart failure clinic. Informed him that if heart function does not improve he may need an ICD - furosemide (LASIX) 40 MG tablet; Take 1 tablet (40 mg total) by mouth daily.  Dispense: 90 tablet; Refill: 2 - Ambulatory referral to Cardiology  3. Essential hypertension Not at goal. He has not taken medicines as yet for today. Encouraged him to take his medicines as soon as he returns home  4. Acute idiopathic gout, unspecified site Patient did have a bottle of prednisone with him which I had prescribed a few weeks ago. I told him to go ahead and start taking it to help knock out this acute episode that he is having.  5. Stage 3b chronic kidney disease ([place]) We do not have Lokelma at our pharmacy. I will give him a one-time dose of Kayexalate instead. Advised to avoid NSAIDs. - Ambulatory referral to Nephrology - sodium polystyrene  (KAYEXALATE) 15 GM/60ML suspension; Take 60 mLs (15 g total) by mouth once for 1 dose.  Dispense: 60 mL; Refill: 0  6. Cocaine abuse ([place]) Strongly advised him to remain clean of all street drugs. He declines considering treatment program.  7. Type 2 diabetes mellitus with obesity ([place]) Controlled on current medications. Dietary counseling given. - POCT glucose (manual entry)  8. Tobacco dependence Advised to quit. He is wanting to quit. We discussed methods to help him quit. Patient declines any medication stating that he will continue to cut back on his own. Have encouraged him to set a quit date.    Patient was given the opportunity to ask questions.  Patient verbalized understanding of the plan and was able to repeat key elements of the plan.   Orders Placed This Encounter  Procedures  . Ambulatory referral to Nephrology  . Ambulatory referral to Cardiology  . POCT glucose (manual entry)     Requested Prescriptions   Signed Prescriptions Disp Refills  . apixaban (ELIQUIS) 5 MG TABS tablet 60 tablet 3    Sig: Take 1 tablet (5 mg total) by mouth 2 (two) times daily.  . furosemide (LASIX) 40 MG tablet 90 tablet 2    Sig: Take 1 tablet (40 mg total) by mouth daily.  . sodium polystyrene (KAYEXALATE) 15 GM/60ML suspension 60 mL 0  Sig: Take 60 mLs (15 g total) by mouth once for 1 dose.    Return in about 6 weeks (around 08/21/2020).  Karle Plumber, MD, FACP

## 2020-07-10 NOTE — Patient Instructions (Signed)
Taking medications daily as prescribed.  Refrain from using street drugs including cocaine.  I have submitted a referral for you to see the cardiologist and nephrologist in follow-up visit.  Please stop at the pharmacy to get the medication called Kayexalate.  It is a one-time dose to help lower the potassium level.

## 2020-07-11 ENCOUNTER — Encounter: Payer: Self-pay | Admitting: Cardiology

## 2020-07-11 ENCOUNTER — Ambulatory Visit (INDEPENDENT_AMBULATORY_CARE_PROVIDER_SITE_OTHER): Payer: Medicaid Other | Admitting: Cardiology

## 2020-07-11 ENCOUNTER — Other Ambulatory Visit: Payer: Self-pay | Admitting: Cardiology

## 2020-07-11 VITALS — BP 130/80 | HR 71 | Ht 72.0 in | Wt 254.0 lb

## 2020-07-11 DIAGNOSIS — I10 Essential (primary) hypertension: Secondary | ICD-10-CM | POA: Diagnosis not present

## 2020-07-11 DIAGNOSIS — F17200 Nicotine dependence, unspecified, uncomplicated: Secondary | ICD-10-CM | POA: Diagnosis not present

## 2020-07-11 DIAGNOSIS — I4892 Unspecified atrial flutter: Secondary | ICD-10-CM

## 2020-07-11 DIAGNOSIS — I5020 Unspecified systolic (congestive) heart failure: Secondary | ICD-10-CM | POA: Diagnosis not present

## 2020-07-11 MED ORDER — CARVEDILOL 25 MG PO TABS: 25.0000 mg | ORAL_TABLET | Freq: Two times a day (BID) | ORAL | 3 refills | Status: DC

## 2020-07-11 MED FILL — CARVEDILOL 25 MG TABLET: 25 | 30 days supply | Qty: 60 | Fill #0

## 2020-07-11 NOTE — Patient Instructions (Addendum)
Medication Instructions:  STOP Lopressor STOP Cardizem  START Coreg 25 MG two times daily.  *If you need a refill on your cardiac medications before your next appointment, please call your pharmacy*   Lab Work: NONe ORdered If you have labs (blood work) drawn today and your tests are completely normal, you will receive your results only by: [name] MyChart Message (if you have MyChart) OR . A paper copy in the mail If you have any lab test that is abnormal or we need to change your treatment, we will call you to review the results.   Testing/Procedures: None Ordered   Follow-Up: At [facility], you and your health needs are our priority.  As part of our continuing mission to provide you with exceptional heart care, we have created designated Provider Care Teams.  These Care Teams include your primary Cardiologist (physician) and Advanced Practice Providers (APPs -  Physician Assistants and Nurse Practitioners) who all work together to provide you with the care you need, when you need it.  We recommend signing up for the patient portal called "MyChart".  Sign up information is provided on this After Visit Summary.  MyChart is used to connect with patients for Virtual Visits (Telemedicine).  Patients are able to view lab/test results, encounter notes, upcoming appointments, etc.  Non-urgent messages can be sent to your provider as well.   To learn more about what you can do with MyChart, go to [email or web address].    Your next appointment:   Follow up  Dr. Clayborn Bigness   The format for your next appointment:     Provider:      Other Instructions

## 2020-07-11 NOTE — Progress Notes (Signed)
Cardiology Office Note:    Date:  07/11/2020   ID:  Chris [name], DOB 06-30-1960, MRN [id number]  PCP:  Chris Pier, MD  [place] Cardiologist:   [facility] Electrophysiologist:  None   Referring MD: Chris Pier, MD   Chief Complaint  Patient presents with  . Follow-up    Hospital F/U-CHF    History of Present Illness:    Chris [name] is a 60 y.o. male with a hx of hypertension, obesity, HFrEF EF 25%, atrial flutter s/p DCCV COPD, current smoker, who presents after recent hospital admission.  Patient being seen by Candise Che cardiology.  Suddenly admitted for shortness of breath, found to be in atrial flutter with RVR.  Underwent a TEE guided cardioversion during that hospital admission with improvement in shortness of breath.  Was started on Eliquis 5 mg twice daily.  Diltiazem 180 mg was also started prior to discharge.  Chart reviewed mentions history of noncompliance per cardiology notes.  Echocardiogram obtained 06/2020 while in the hospital showed EF of 25%.  He still smokes.  States having a left heart cath years ago with no obstructive disease.  Past Medical History:  Diagnosis Date  . CHF (congestive heart failure) ([place])   . Chronic kidney disease   . COPD (chronic obstructive pulmonary disease) ([place])   . Coronary artery disease   . Depression   . Diabetes mellitus without complication ([place])   . GERD (gastroesophageal reflux disease)   . Gout   . Hypertension   . Influenza A with respiratory manifestations   . Mental disorder     Past Surgical History:  Procedure Laterality Date  . ANKLE SURGERY    . CARDIAC CATHETERIZATION    . CARDIOVERSION N/A 07/08/2020   Procedure: CARDIOVERSION;  Surgeon: Corey Skains, MD;  Location: [facility] ORS;  Service: Cardiovascular;  Laterality: N/A;  . HERNIA REPAIR     x2  . SHOULDER SURGERY    . TEE WITHOUT CARDIOVERSION N/A 07/08/2020   Procedure: TRANSESOPHAGEAL ECHOCARDIOGRAM (TEE);  Surgeon:  Corey Skains, MD;  Location: [facility] ORS;  Service: Cardiovascular;  Laterality: N/A;    Current Medications: Current Meds  Medication Sig  . albuterol (PROVENTIL) (2.5 MG/3ML) 0.083% nebulizer solution Take 3 mLs by nebulization every 6 (six) hours as needed for shortness of breath.  [name] albuterol (VENTOLIN HFA) 108 (90 Base) MCG/ACT inhaler Inhale 2 puffs into the lungs every 6 (six) hours as needed for wheezing or shortness of breath.  . allopurinol (ZYLOPRIM) 100 MG tablet Take 2 tablets (200 mg total) by mouth daily.  [name] apixaban (ELIQUIS) 5 MG TABS tablet Take 1 tablet (5 mg total) by mouth 2 (two) times daily.  [name] aspirin 81 MG EC tablet Take 1 tablet (81 mg total) by mouth daily.  [name] atorvastatin (LIPITOR) 40 MG tablet Take 1 tablet (40 mg total) by mouth daily at 6 PM.  . budesonide-formoterol (SYMBICORT) 160-4.5 MCG/ACT inhaler Inhale 2 puffs into the lungs 2 (two) times daily.  . carvedilol (COREG) 25 MG tablet Take 1 tablet (25 mg total) by mouth 2 (two) times daily.  . diclofenac Sodium (VOLTAREN) 1 % GEL Apply 2 g topically 4 (four) times daily. Use on elbow pain  . fluticasone (FLONASE) 50 MCG/ACT nasal spray Place 1 spray into both nostrils daily as needed for allergies or rhinitis.  . furosemide (LASIX) 40 MG tablet Take 1 tablet (40 mg total) by mouth daily.  [name] glipiZIDE (GLUCOTROL) 5 MG tablet  Take 0.5 tablets (2.5 mg total) by mouth 2 (two) times daily before a meal.  . isosorbide-hydrALAZINE (BIDIL) 20-37.5 MG tablet Take 1 tablet by mouth 3 (three) times daily.  . montelukast (SINGULAIR) 10 MG tablet Take 1 tablet (10 mg total) by mouth at bedtime.  . sodium zirconium cyclosilicate (LOKELMA) 5 g packet Take 5 g by mouth every other day for 2 doses.  . [DISCONTINUED] diltiazem (CARDIZEM CD) 180 MG 24 hr capsule Take 1 capsule (180 mg total) by mouth daily.  . [DISCONTINUED] metoprolol tartrate (LOPRESSOR) 100 MG tablet Take 1 tablet (100 mg total) by mouth 2 (two) times daily.      Allergies:   Patient has no known allergies.   Social History   Socioeconomic History  . Marital status: Single    Spouse name: Not on file  . Number of children: Not on file  . Years of education: Not on file  . Highest education level: Not on file  Occupational History  . Not on file  Tobacco Use  . Smoking status: Current Every Day Smoker    Packs/day: 1.00    Years: 20.00    Pack years: 20.00    Types: Cigarettes  . Smokeless tobacco: Never Used  . Tobacco comment: less than 1 pack per day  Vaping Use  . Vaping Use: Never used  Substance and Sexual Activity  . Alcohol use: No  . Drug use: Yes    Frequency: 21.0 times per week    Types: Marijuana, Cocaine    Comment: no longer- Cocaine  . Sexual activity: Not on file  Other Topics Concern  . Not on file  Social History Narrative   ** Merged History Encounter **       Social Determinants of Health   Financial Resource Strain: Not on file  Food Insecurity: Food Insecurity Present  . Worried About Charity fundraiser in the Last Year: Often true  . Ran Out of Food in the Last Year: Often true  Transportation Needs: Unmet Transportation Needs  . Lack of Transportation (Medical): Yes  . Lack of Transportation (Non-Medical): Yes  Physical Activity: Not on file  Stress: Not on file  Social Connections: Not on file     Family History: The patient's family history includes Diabetes in his mother; HIV in his brother; Healthy in his daughter and son; Heart disease in his father.  ROS:   Please see the history of present illness.     All other systems reviewed and are negative.  EKGs/Labs/Other Studies Reviewed:    The following studies were reviewed today:   EKG:  EKG is  ordered today.  The ekg ordered today demonstrates normal sinus rhythm  Recent Labs: 10/26/2019: TSH 1.592 07/03/2020: ALT 20; B Natriuretic Peptide 1,617.4 07/09/2020: BUN 60; Creatinine, Ser 2.14; Hemoglobin 15.2; Magnesium 2.5;  Platelets 247; Potassium 5.4; Sodium 135  Recent Lipid Panel    Component Value Date/Time   CHOL 176 12/25/2019 0632   CHOL 160 11/07/2019 0918   CHOL 157 10/21/2013 0435   TRIG 91 12/25/2019 0632   TRIG 117 10/21/2013 0435   HDL 37 (L) 12/25/2019 0632   HDL 40 11/07/2019 0918   HDL 30 (L) 10/21/2013 0435   CHOLHDL 4.8 12/25/2019 0632   VLDL 18 12/25/2019 0632   VLDL 23 10/21/2013 0435   LDLCALC 121 (H) 12/25/2019 0632   LDLCALC 92 11/07/2019 0918   LDLCALC 104 (H) 10/21/2013 0435     Risk Assessment/Calculations:  Physical Exam:    VS:  BP 130/80 (BP Location: Right Arm, Patient Position: Sitting, Cuff Size: Large)   Pulse 71   Ht 6' (1.829 m)   Wt 254 lb (115.2 kg)   SpO2 98%   BMI 34.45 kg/m     Wt Readings from Last 3 Encounters:  07/11/20 254 lb (115.2 kg)  07/10/20 254 lb 6.4 oz (115.4 kg)  07/09/20 257 lb 11.2 oz (116.9 kg)     GEN:  Well nourished, well developed in no acute distress HEENT: Normal NECK: No JVD; No carotid bruits LYMPHATICS: No lymphadenopathy CARDIAC: RRR, no murmurs, rubs, gallops RESPIRATORY: Decreased breath sounds at bases, no wheezing ABDOMEN: Soft, non-tender, distended MUSCULOSKELETAL:  No edema; No deformity  SKIN: Warm and dry NEUROLOGIC:  Alert and oriented x 3 PSYCHIATRIC:  Normal affect   ASSESSMENT:    1. HFrEF (heart failure with reduced ejection fraction) ([place])   2. Atrial flutter, unspecified type ([place])   3. Primary hypertension   4. Smoking    PLAN:    In order of problems listed above:  1. HFrEF, EF 25%, describes NYHA class II-III symptoms.  Stop Lopressor, start Coreg 25 mg twice daily.  Stop diltiazem due to severely reduced ejection fraction.  Continue BiDil.  Lasix 40 mg daily. 2. Atrial flutter, CHA2DS2-VASc 3.  Maintaining sinus rhythm.  Coreg, Eliquis 3. Hypertension, BP controlled.  Coreg, BiDil, Lasix 4. Patient is a current smoker, cessation advised.  Over 5 minutes spent counseling  patient.   Patient will follow up with primary cardiologist Dr. Clayborn Bigness as he has known this patient for very long time, also  For continuity of care.  Patient is happy with the service provided by Dr. Clayborn Bigness. there is no reason/indication to switch providers.    Medication Adjustments/Labs and Tests Ordered: Current medicines are reviewed at length with the patient today.  Concerns regarding medicines are outlined above.  Orders Placed This Encounter  Procedures  . EKG 12-Lead   Meds ordered this encounter  Medications  . carvedilol (COREG) 25 MG tablet    Sig: Take 1 tablet (25 mg total) by mouth 2 (two) times daily.    Dispense:  60 tablet    Refill:  3    Patient Instructions  Medication Instructions:  STOP Lopressor STOP Cardizem  START Coreg 25 MG two times daily.  *If you need a refill on your cardiac medications before your next appointment, please call your pharmacy*   Lab Work: NONe ORdered If you have labs (blood work) drawn today and your tests are completely normal, you will receive your results only by: [name] MyChart Message (if you have MyChart) OR . A paper copy in the mail If you have any lab test that is abnormal or we need to change your treatment, we will call you to review the results.   Testing/Procedures: None Ordered   Follow-Up: At [facility], you and your health needs are our priority.  As part of our continuing mission to provide you with exceptional heart care, we have created designated Provider Care Teams.  These Care Teams include your primary Cardiologist (physician) and Advanced Practice Providers (APPs -  Physician Assistants and Nurse Practitioners) who all work together to provide you with the care you need, when you need it.  We recommend signing up for the patient portal called "MyChart".  Sign up information is provided on this After Visit Summary.  MyChart is used to connect with patients for Virtual Visits (Telemedicine).  Patients are able to view lab/test results, encounter notes, upcoming appointments, etc.  Non-urgent messages can be sent to your provider as well.   To learn more about what you can do with MyChart, go to [email or web address].    Your next appointment:   Follow up  Dr. Clayborn Bigness   The format for your next appointment:     Provider:      Other Instructions      Signed, [name], MD  07/11/2020 12:44 PM    [place]

## 2020-07-17 ENCOUNTER — Other Ambulatory Visit: Payer: Self-pay

## 2020-07-17 ENCOUNTER — Encounter: Payer: Self-pay | Admitting: Family

## 2020-07-17 ENCOUNTER — Ambulatory Visit: Payer: Medicaid Other | Attending: Family | Admitting: Family

## 2020-07-17 VITALS — BP 126/90 | HR 78 | Resp 18 | Ht 73.0 in | Wt 259.4 lb

## 2020-07-17 DIAGNOSIS — Z7951 Long term (current) use of inhaled steroids: Secondary | ICD-10-CM | POA: Insufficient documentation

## 2020-07-17 DIAGNOSIS — F17210 Nicotine dependence, cigarettes, uncomplicated: Secondary | ICD-10-CM | POA: Diagnosis not present

## 2020-07-17 DIAGNOSIS — I5022 Chronic systolic (congestive) heart failure: Secondary | ICD-10-CM | POA: Insufficient documentation

## 2020-07-17 DIAGNOSIS — Z7982 Long term (current) use of aspirin: Secondary | ICD-10-CM | POA: Insufficient documentation

## 2020-07-17 DIAGNOSIS — I4892 Unspecified atrial flutter: Secondary | ICD-10-CM | POA: Insufficient documentation

## 2020-07-17 DIAGNOSIS — Z8249 Family history of ischemic heart disease and other diseases of the circulatory system: Secondary | ICD-10-CM | POA: Diagnosis not present

## 2020-07-17 DIAGNOSIS — E1122 Type 2 diabetes mellitus with diabetic chronic kidney disease: Secondary | ICD-10-CM | POA: Diagnosis not present

## 2020-07-17 DIAGNOSIS — I10 Essential (primary) hypertension: Secondary | ICD-10-CM

## 2020-07-17 DIAGNOSIS — I2510 Atherosclerotic heart disease of native coronary artery without angina pectoris: Secondary | ICD-10-CM | POA: Insufficient documentation

## 2020-07-17 DIAGNOSIS — Z7984 Long term (current) use of oral hypoglycemic drugs: Secondary | ICD-10-CM | POA: Insufficient documentation

## 2020-07-17 DIAGNOSIS — Z7901 Long term (current) use of anticoagulants: Secondary | ICD-10-CM | POA: Diagnosis not present

## 2020-07-17 DIAGNOSIS — M109 Gout, unspecified: Secondary | ICD-10-CM | POA: Diagnosis not present

## 2020-07-17 DIAGNOSIS — I130 Hypertensive heart and chronic kidney disease with heart failure and stage 1 through stage 4 chronic kidney disease, or unspecified chronic kidney disease: Secondary | ICD-10-CM | POA: Insufficient documentation

## 2020-07-17 DIAGNOSIS — J449 Chronic obstructive pulmonary disease, unspecified: Secondary | ICD-10-CM | POA: Diagnosis not present

## 2020-07-17 DIAGNOSIS — N189 Chronic kidney disease, unspecified: Secondary | ICD-10-CM | POA: Insufficient documentation

## 2020-07-17 DIAGNOSIS — F17200 Nicotine dependence, unspecified, uncomplicated: Secondary | ICD-10-CM

## 2020-07-17 DIAGNOSIS — R0602 Shortness of breath: Secondary | ICD-10-CM | POA: Diagnosis present

## 2020-07-17 DIAGNOSIS — I5020 Unspecified systolic (congestive) heart failure: Secondary | ICD-10-CM

## 2020-07-17 NOTE — Progress Notes (Signed)
Patient ID: Chris [name], male    DOB: 1960-12-31, 60 y.o.   MRN: [id number]  HPI  Mr Maulden is a 60 y/o male with a history of CAD, DM, HTN, CKD, gout, depression, atrial flutter, COPD, GERD, current tobacco use and chronic heart failure.   Echo report from 06/17/20 reviewed and showed an EF of 20-25% along with mild LVH and mild MR. TEE done 07/08/20 but unable to view report.   Admitted 07/03/20 due to chest pain and shortness of breath. Hypoxic and initially placed on CPAP. Cardiology consult obtained. Initially given IV lasix with transition to oral diuretics with resultant loss of ~ 14L. Weaned off CPAP to BiPAP and then to room air. Given some solu-medrol. Discharged after 6 days. Was in the ED 06/26/20 due to shortness of breath, wheezing and pedal edema. Diuretic increased for 5 days and he was released. Admitted 06/16/20 due to HF exacerbation. Significant stress due to pending court date. Hospitalist wanted cardiology to see patient but patient refused and left AMA after 2 days.   He presents today for his initial visit with a chief complaint of minimal shortness of breath upon moderate exertion. He describes this as chronic in nature having been present for several months although he says that it's markedly improved since his recent admission. He has associated cough along with this. He denies any difficulty sleeping, dizziness, abdominal distention, palpitations, pedal edema, chest pain, fatigue or weight gain.   Says that he normally sees Dr. Clayborn Bigness but doesn't currently have an appointment scheduled and asks if we can schedule one for him. Patient is unsure how he ended up with [facility] cardiology appointment.   Past Medical History:  Diagnosis Date  . Arrhythmia    atrial flutter  . CHF (congestive heart failure) ([place])   . Chronic kidney disease   . COPD (chronic obstructive pulmonary disease) ([place])   . Coronary artery disease   . Depression   . Diabetes mellitus without  complication ([place])   . GERD (gastroesophageal reflux disease)   . Gout   . Hypertension   . Influenza A with respiratory manifestations   . Mental disorder    Past Surgical History:  Procedure Laterality Date  . ANKLE SURGERY    . CARDIAC CATHETERIZATION    . CARDIOVERSION N/A 07/08/2020   Procedure: CARDIOVERSION;  Surgeon: Corey Skains, MD;  Location: [facility] ORS;  Service: Cardiovascular;  Laterality: N/A;  . HERNIA REPAIR     x2  . SHOULDER SURGERY    . TEE WITHOUT CARDIOVERSION N/A 07/08/2020   Procedure: TRANSESOPHAGEAL ECHOCARDIOGRAM (TEE);  Surgeon: Corey Skains, MD;  Location: [facility] ORS;  Service: Cardiovascular;  Laterality: N/A;   Family History  Problem Relation Age of Onset  . Heart disease Father   . Diabetes Mother   . HIV Brother   . Healthy Son   . Healthy Daughter    Social History   Tobacco Use  . Smoking status: Current Every Day Smoker    Packs/day: 1.00    Years: 20.00    Pack years: 20.00    Types: Cigarettes  . Smokeless tobacco: Never Used  . Tobacco comment: less than 1 pack per day  Substance Use Topics  . Alcohol use: No   No Known Allergies Prior to Admission medications   Medication Sig Start Date End Date Taking? Authorizing Provider  albuterol (PROVENTIL) (2.5 MG/3ML) 0.083% nebulizer solution Take 3 mLs by nebulization every 6 (six) hours as needed for  shortness of breath. 04/17/20  Yes [name], MD  albuterol (VENTOLIN HFA) 108 (90 Base) MCG/ACT inhaler Inhale 2 puffs into the lungs every 6 (six) hours as needed for wheezing or shortness of breath. 04/17/20   [name], MD  allopurinol (ZYLOPRIM) 100 MG tablet Take 2 tablets (200 mg total) by mouth daily. 07/01/20   [name], MD  apixaban (ELIQUIS) 5 MG TABS tablet Take 1 tablet (5 mg total) by mouth 2 (two) times daily. 07/10/20   [name], MD  aspirin 81 MG EC tablet Take 1 tablet (81 mg total) by mouth daily. 07/01/20   [name], MD   atorvastatin (LIPITOR) 40 MG tablet Take 1 tablet (40 mg total) by mouth daily at 6 PM. 07/01/20   [name], MD  budesonide-formoterol [facility]) 160-4.5 MCG/ACT inhaler Inhale 2 puffs into the lungs 2 (two) times daily. 04/17/20   [name], MD  carvedilol (COREG) 25 MG tablet Take 1 tablet (25 mg total) by mouth 2 (two) times daily. 07/11/20 10/09/20  [name], MD  diclofenac Sodium (VOLTAREN) 1 % GEL Apply 2 g topically 4 (four) times daily. Use on elbow pain 05/05/20   [name], MD  fluticasone [facility]) 50 MCG/ACT nasal spray Place 1 spray into both nostrils daily as needed for allergies or rhinitis. 05/06/20   Swords, Darrick Penna, MD  furosemide (LASIX) 40 MG tablet Take 1 tablet (40 mg total) by mouth daily. 07/10/20   [name], MD  glipiZIDE (GLUCOTROL) 5 MG tablet Take 0.5 tablets (2.5 mg total) by mouth 2 (two) times daily before a meal. 07/01/20   [name], MD  isosorbide-hydrALAZINE (BIDIL) 20-37.5 MG tablet Take 1 tablet by mouth 3 (three) times daily. 07/01/20   [name], MD  montelukast (SINGULAIR) 10 MG tablet Take 1 tablet (10 mg total) by mouth at bedtime. 07/01/20 09/29/20  [name], MD    Review of Systems  Constitutional: Negative for appetite change and fatigue.  HENT: Negative for congestion, postnasal drip and sore throat.   Eyes: Negative.   Respiratory: Positive for cough and shortness of breath. Negative for chest tightness.   Cardiovascular: Negative for chest pain, palpitations and leg swelling.  Gastrointestinal: Negative for abdominal distention and abdominal pain.  Endocrine: Negative.   Genitourinary: Negative.   Musculoskeletal: Negative for back pain and neck pain.  Skin: Negative.   Allergic/Immunologic: Negative.   Neurological: Negative for dizziness and light-headedness.  Hematological: Negative for adenopathy. Does not bruise/bleed easily.  Psychiatric/Behavioral: Negative for dysphoric  mood and sleep disturbance (sleeping on 2 pillows; wearing oxygen at 2L at bedtime; no cpap). The patient is not nervous/anxious.     Vitals:   07/17/20 1257  BP: 126/90  Pulse: 78  Resp: 18  SpO2: 99%  Weight: 259 lb 6 oz (117.7 kg)  Height: 6\' 1"  (1.854 m)   Wt Readings from Last 3 Encounters:  07/17/20 259 lb 6 oz (117.7 kg)  07/11/20 254 lb (115.2 kg)  07/10/20 254 lb 6.4 oz (115.4 kg)   Lab Results  Component Value Date   CREATININE 2.14 (H) 07/09/2020   CREATININE 1.83 (H) 07/08/2020   CREATININE 2.00 (H) 07/07/2020    Physical Exam Vitals and nursing note reviewed.  Constitutional:      Appearance: Normal appearance.  HENT:     Head: Normocephalic and atraumatic.  Cardiovascular:     Rate and Rhythm: Normal rate and regular rhythm.  Pulmonary:     Effort: Pulmonary effort is normal. No respiratory distress.     Breath sounds: No wheezing or rales.  Abdominal:     General: There is no distension.     Palpations: Abdomen is soft.  Musculoskeletal:        General: No tenderness.     Cervical back: Normal range of motion and neck supple.     Right lower leg: No edema.     Left lower leg: No edema.  Skin:    General: Skin is warm and dry.  Neurological:     General: No focal deficit present.     Mental Status: He is alert and oriented to person, place, and time.  Psychiatric:        Mood and Affect: Mood is anxious.        Behavior: Behavior normal.        Thought Content: Thought content normal.    Assessment & Plan:  1: Chronic heart failure with reduced ejection fraction- - NYHA class II - euvolemic today - weighing daily; reminded to call for an overnight weight gain of > 2 pounds or a weekly weight gain of > 5 pounds - not adding salt and says that he's reading food labels for sodium content; reviewed the importance of closely following a low sodium diet - saw cardiology ([place]) 07/11/20 - saw cardiology Juliann Pares) 10/03/2018; per patient  request, appointment scheduled for 08/14/20 - consider adding entresto but would need to closely follow renal function - BNP 07/03/20 was 1617.4 - reports getting 3 covid vaccines - says that he hasn't gotten his flu vaccine yet  2: HTN- - BP looks good today - saw PCP Laural Benes) 07/10/20 - BMP 07/09/20 reviewed and showed sodium 135, potassium 5.4, creatinine 2.14 and GFR 35  3: COPD- - wearing oxygen at 2L at bedtime - supposed to be wearing CPAP but he says that he's unable to tolerate it  4: Tobacco use- - smoking 1/3 ppd of cigarettes - denies using alcohol - denies any cocaine use since admission on 07/03/20; admits that it's difficult staying away from it but that he takes it minute by minute and day by day and he's wanting to be honest about it - complete cessation of all substances was discussed for 3 minutes; encouraged him to continue abstaining from cocaine   Patient did not bring his medication bottles nor a list and admits that he has no idea of anything that he's taking. Will not adjust any medication today and emphasized that he needed to bring his bottles to every visit every time.   Return in 6 weeks or sooner for any questions/problems before then.

## 2020-07-17 NOTE — Patient Instructions (Addendum)
Continue weighing daily and call for an overnight weight gain of > 2 pounds or a weekly weight gain of >5 pounds.    08/14/2020 at 2:45pm Dr[facility]  [address], [place], [place] [address] [phone]   MUST BRING MEDICINE BOTTLES TO NEXT APPOINTMENT

## 2020-07-22 ENCOUNTER — Other Ambulatory Visit: Payer: Self-pay

## 2020-07-22 NOTE — Patient Instructions (Signed)
Visit Information  Chris [name] was given information about Medicaid Managed Care team care coordination services as a part of their Healthy [facility] Medicaid benefit. Chris [name] verbally consented to engagement with the [facility] Managed Care team.   For questions related to your Healthy [facility] health plan, please call: [phone] or visit the homepage here: [email or web address]  If you would like to schedule transportation through your Healthy [facility] plan, please call the following number at least 2 days in advance of your appointment: [phone]  Chris [name] - following are the goals we discussed in your visit today:  Goals Addressed   None       Social Worker will follow up in 30 days.   Chris [name]  Following is a copy of your plan of care:  Patient Care Plan: Heart Failure (Adult)    Problem Identified: Symptom Exacerbation (Heart Failure)     Goal: Symptom Exacerbation Prevented or Minimized   Start Date: 07/02/2020  Expected End Date: 09/01/2020  This Visit's Progress: On track  Priority: High  Note:   Current Barriers:  . Chris [name]  Nurse Case Manager Clinical Goal(s):  Chris [name] Kitchen Over the next 30 days, patient will work with Chris [name] to address needs related to housing . Over the next 30 days, patient will meet with RN Care Manager to address needs related to managing CHF and DMII . Over the next 30 days, patient will demonstrate improved health management independence as evidenced bytaking prescribed medications, reporting any changes in symptoms to PCP such as swelling, changes in breathing, new cough  Interventions:  . Inter-disciplinary care team collaboration (see longitudinal plan of care) . Evaluation of current treatment plan related to CHF and DMII and patient's adherence to plan as established by provider. . Advised patient to follow up with PCP  with any concerns or changes in symptoms . Reviewed medications with patient and discussed that Chris [name] has one prescription that was unable to be filled yesterday when he picked up his other medications. RNCM called pharmacy and entresto is ready to be picked up. Chris [name] aware and plans to get it today . Collaborated with [name] regarding housing need . Discussed plans with patient for ongoing care management follow up and provided patient with direct contact information for care management team . Advised patient, providing education and rationale, to weigh daily and record, calling PCP for weight gain of 3lbs overnight or 5 pounds in a week.  Chris [name] Kitchen Pharmacy referral for medication review  Patient Goals/Self-Care Activities Over the next 30 days, patient will: -Call to fill prescriptions one week before I run out of medication -Take all medications as prescribed - call office if I gain more than 2 pounds in one day or 5 pounds in one week - use salt in moderation - watch for swelling in feet, ankles and legs every day  - eat more whole grains, fruits and vegetables, lean meats and healthy fats - know when to call the doctor - track symptoms and what helps feel better or worse - dress right for the weather, hot or cold   Follow Up Plan: Telephone follow up appointment with Managed Medicaid care management team member scheduled for:07/30/20 @ 2:30pm      Patient Care Plan: General Social Work (Adult)    Problem Identified: [name]   Onset Date: 07/09/2020  Note:   Chris [name] is a 60 y.o. year old male who sees Chris [name], Chris Batman, Chris [name]  for primary care. The  Medicaid Managed Care team was consulted for assistance with Housing barriers. Chris. [place] was given information about Care Management services, agreed to services, and verbal consent for services was obtained.  Interventions:  . Patient interviewed and appropriate assessments performed . Collaborated with clinical team  regarding patient needs  . SDOH (Social Determinants of Health) assessments performed: Yes .     Chris [name] Kitchen Provided patient with information about the [facility]. [name] contacted [facility] to complete a referral for patient to get into the homeless motel. [name] left a voicemail. [name] informed patient that the process can take a long time. Patient states he does not know why no one wants to help him. [name] informed patient he may be able to go to a shelter in [facility], patient states he does not feel safe in [place].  [name] contacted [employer] in [place] [phone] and left a voicemail for the intake coordinator. . [name] contacted [place] to speak with a Education officer, museum but kept getting transferred.  . Patient states he does not have any money to get a room and become frustrated again stating that we ""do not want to help him. [name] informed patient that she will wait for the [facility] and/or the [employer] [facility] to contact her back. [name] informed patient she could provide him with the information for [facility] but patient did not want to call. Chris [name] Kitchen Update 07/22/20: Patient stated he is doing okay since his surgery but still wants somewhere else to live. Patient stated he knows of a boarding house on [address] and would like SW to help him get in it. SW researched and could not find a boarding house on [place], Patient stated he would get the name of it and contact [name] with the information.  Plan:  . Over the next 30-60 days, patient will work with [name] to address needs related to Housing barriers . Social Worker will continue to follow up with patient.   Chris [name], [name], [place]  High Risk Managed Medicaid Team

## 2020-07-22 NOTE — Patient Outreach (Signed)
Medicaid Managed Care Social Work Note  07/22/2020 Name:  Chris [name] MRN:  [id number] DOB:  1960-12-03  Chris [name] is an 60 y.o. year old male who is a primary patient of [name], MD.  The Medicaid Managed Care Coordination team was consulted for assistance with:  housing barriers  Chris [name] was given information about Medicaid Managed CareCoordination services today. [name] agreed to services and verbal consent obtained.  Engaged with patient  for by telephone forfollow up visit in response to referral for case management and/or care coordination services.   Assessments/Interventions:  Review of past medical history, allergies, medications, health status, including review of consultants reports, laboratory and other test data, was performed as part of comprehensive evaluation and provision of chronic care management services.  SDOH: (Social Determinant of Health) assessments and interventions performed:   Patient stated he is doing okay since his surgery but still wants somewhere else to live. Patient stated he knows of a boarding house on [place] and would like SW to help him get in it. SW researched and could not find a boarding house on [place], Patient stated he would get the name of it and contact [name] with the information.   Advanced Directives Status:  Not addressed in this encounter.  Care Plan                 No Known Allergies  Medications Reviewed Today    Reviewed by Alisa Graff, FNP (Family Nurse Practitioner) on 07/17/20 at 57  Med List Status: <None>  Medication Order Taking? Sig Documenting Provider Last Dose Status Informant  albuterol (PROVENTIL) (2.5 MG/3ML) 0.083% nebulizer solution [id number] Yes Take 3 mLs by nebulization every 6 (six) hours as needed for shortness of breath. [name], MD Taking Active Pharmacy Records           Med Note [facility], [place]   Fri Jul 11, 2020  9:19 AM)    albuterol (VENTOLIN  HFA) 108 (90 Base) MCG/ACT inhaler [id number]  Inhale 2 puffs into the lungs every 6 (six) hours as needed for wheezing or shortness of breath. [name], MD  Active Pharmacy Records           Med Note [name], [facility]   Fri Jul 04, 2020  9:48 AM)    allopurinol (ZYLOPRIM) 100 MG tablet [id number]  Take 2 tablets (200 mg total) by mouth daily. [name], MD  Active Pharmacy Records  apixaban [facility]) 5 MG TABS tablet [id number]  Take 1 tablet (5 mg total) by mouth 2 (two) times daily. [name], MD  Active   aspirin 81 MG EC tablet [id number]  Take 1 tablet (81 mg total) by mouth daily. [name], MD  Active Pharmacy Records  atorvastatin (LIPITOR) 40 MG tablet [id number]  Take 1 tablet (40 mg total) by mouth daily at 6 PM. [name], MD  Active Pharmacy Records  budesonide-formoterol [facility]) 160-4.5 MCG/ACT inhaler [id number]  Inhale 2 puffs into the lungs 2 (two) times daily. [name], MD  Active Pharmacy Records           Med Note [name], [facility]   Fri Jul 04, 2020  9:46 AM)    carvedilol (COREG) 25 MG tablet [id number]  Take 1 tablet (25 mg total) by mouth 2 (two) times daily. [name], MD  Active   diclofenac Sodium (VOLTAREN) 1 % GEL [id number]  Apply 2 g topically 4 (four)  times daily. Use on elbow pain [name], MD  Active Pharmacy Records  fluticasone [facility]) 50 MCG/ACT nasal spray [id number]  Place 1 spray into both nostrils daily as needed for allergies or rhinitis. [name], MD  Active Pharmacy Records  furosemide (LASIX) 40 MG tablet [id number]  Take 1 tablet (40 mg total) by mouth daily. [name], MD  Active   glipiZIDE (GLUCOTROL) 5 MG tablet [id number]  Take 0.5 tablets (2.5 mg total) by mouth 2 (two) times daily before a meal. [name], MD  Active Pharmacy Records  isosorbide-hydrALAZINE (BIDIL) 20-37.5 MG tablet [id number]  Take 1 tablet by mouth 3 (three) times daily. [name], MD  Active Pharmacy Records  montelukast (SINGULAIR) 10 MG tablet [id number]  Take 1 tablet (10 mg total) by mouth at bedtime. [name], MD  Active Pharmacy Records          Patient Active Problem List   Diagnosis Date Noted  . COPD exacerbation ([place])   . Typical atrial flutter ([place])   . Acute respiratory failure with hypoxia ([place]) 07/05/2020  . CHF (congestive heart failure) ([place]) 07/04/2020  . Acute exacerbation of CHF (congestive heart failure) ([place]) 06/16/2020  . COPD with acute exacerbation ([place]) 06/16/2020  . Influenza vaccine refused 05/06/2020  . Acute on chronic combined systolic (congestive) and diastolic (congestive) heart failure ([place]) 05/05/2020  . Acute decompensated heart failure ([place]) 05/04/2020  . Illiteracy 05/04/2020  . Chronic respiratory failure with hypoxia, on home oxygen therapy ([place]) 12/28/2019  . Type 2 diabetes mellitus with stage 3 chronic kidney disease ([place]) 12/25/2019  . Acute and chronic respiratory failure (acute-on-chronic) ([place]) 12/25/2019  . Acute on chronic combined systolic and diastolic CHF (congestive heart failure) ([place]) 10/26/2019  . Elevated troponin I level 10/26/2019  . Acute on chronic diastolic (congestive) heart failure ([place]) 10/26/2019  . History of gout 02/01/2019  . Seasonal allergic rhinitis due to pollen 02/01/2019  . Tobacco dependence 11/30/2018  . Microscopic hematuria 11/30/2018  . Depression 11/30/2018  . Difficulty controlling anger 11/30/2018  . CAP (community acquired pneumonia) 08/11/2018  . COPD (chronic obstructive pulmonary disease) ([place])   . CKD (chronic kidney disease) stage 3, GFR 30-59 ml/min (HCC) 08/10/2018  . Recurrent epistaxis 04/21/2018  . Mixed hyperlipidemia 07/28/2017  . Essential hypertension 07/28/2017  . Chronic systolic heart failure ([place]) 10/25/2014  . Cocaine abuse ([place]) 02/20/2013  . Cannabis abuse 02/20/2013  . Back pain, chronic 02/20/2013    Conditions to be  addressed/monitored per PCP order:  Homelessness  Care Plan : General Social Work (Adult)  Updates made by Ethelda Chick since 07/22/2020 12:00 AM    Problem: Homelessness   Onset Date: 07/09/2020  Note:   Chris [name] is a 60 y.o. year old male who sees [name], MD for primary care. The  Medicaid Managed Care team was consulted for assistance with Housing barriers. Chris [name] was given information about Care Management services, agreed to services, and verbal consent for services was obtained.  Interventions:  . Patient interviewed and appropriate assessments performed . Collaborated with clinical team regarding patient needs  . SDOH (Social Determinants of Health) assessments performed: Yes .     [name] Provided patient with information about the [facility]. [name] contacted [facility] to complete a referral for patient to get into the homeless motel. [name] left a voicemail. [name] informed patient that the process can take a long time. Patient states he does not  know why no one wants to help him. [name] informed patient he may be able to go to a shelter in [facility], patient states he does not feel safe in [place].  [name] contacted [employer] in [place] [phone] and left a voicemail for the intake coordinator. . [name] contacted [place] to speak with a Education officer, museum but kept getting transferred.  . Patient states he does not have any money to get a room and become frustrated again stating that we "[place]"do not want to help him. [name] informed patient that she will wait for the [facility] and/or the [employer] [facility] to contact her back. [name] informed patient she could provide him with the information for [facility] but patient did not want to call. [name] Update 07/22/20: Patient stated he is doing okay since his surgery but still wants somewhere else to live. Patient stated he knows of a boarding house on [address] and would like SW to help him get in it. SW  researched and could not find a boarding house on [place], Patient stated he would get the name of it and contact [name] with the information.  Plan:  . Over the next 30-60 days, patient will work with [name] to address needs related to Housing barriers . Social Worker will continue to follow up with patient.   [name], [name], [place]  High Risk Managed Medicaid Team           Follow up:  Patient agrees to Care Plan and Follow-up.  Plan: The Managed Medicaid care management team will reach out to the patient again over the next 30 days.  Date/time of next scheduled Social Work care management/care coordination outreach:  08/19/20  [name], [place], [place]  High Risk Managed Medicaid Team

## 2020-07-24 ENCOUNTER — Telehealth: Payer: Self-pay | Admitting: Internal Medicine

## 2020-07-24 DIAGNOSIS — M109 Gout, unspecified: Secondary | ICD-10-CM

## 2020-07-24 NOTE — Telephone Encounter (Signed)
Copied from [place] [id number]. Topic: General - Other >> Jul 24, 2020 11:22 AM Chris [name] wrote: Reason for CRM: Patient called in to inform Dr Wynetta Emery that he is having [name] bad flare of gout and say that the medication is not helping and he is in [name] lot of pain and need help badly. Asking for [name] call back at Ph# [phone]

## 2020-07-25 ENCOUNTER — Other Ambulatory Visit: Payer: Self-pay | Admitting: Internal Medicine

## 2020-07-25 MED ORDER — PREDNISONE 20 MG PO TABS: ORAL_TABLET | ORAL | 0 refills | Status: DC

## 2020-07-25 MED FILL — predniSONE 20 MG TABS: 20 | 12 days supply | Qty: 15 | Fill #0

## 2020-07-25 NOTE — Addendum Note (Signed)
Addended by: Karle Plumber B on: 07/25/2020 01:26 PM   Modules accepted: Orders

## 2020-07-25 NOTE — Telephone Encounter (Signed)
Will forward to provider  

## 2020-07-28 MED FILL — ALLOPURINOL 100 MG TABLET: 100 | 30 days supply | Qty: 60 | Fill #1

## 2020-07-28 NOTE — Telephone Encounter (Signed)
Contacted pt and went over Dr. Wynetta Emery pt is aware and doesn't have any questions or concerns

## 2020-07-30 ENCOUNTER — Other Ambulatory Visit: Payer: Self-pay | Admitting: *Deleted

## 2020-07-30 NOTE — Patient Instructions (Signed)
Visit Information  Mr. Cadden F Eisenstein  - as a part of your Medicaid benefit, you are eligible for care management and care coordination services at no cost or copay. I was unable to reach you by phone today but would be happy to help you with your health related needs. Please feel free to call me @ [phone].   A member of the Managed Medicaid care management team will reach out to you again over the next 7-14 days.   Melanie Robb RN, BSN [place]  [facility] RN Care Coordinator   

## 2020-07-30 NOTE — Patient Outreach (Signed)
Care Coordination  07/30/2020  Chris [name] 1961-03-22 [id number]    Medicaid Managed Care   Unsuccessful Outreach Note  07/30/2020 Name: Chris [name] MRN: [id number] DOB: [birth year]  Referred by: [name], MD Reason for referral : No chief complaint on file.   An unsuccessful telephone outreach was attempted today. The patient was referred to the case management team for assistance with care management and care coordination.   Follow Up Plan: A HIPAA compliant phone message was left for the patient providing contact information and requesting a return call.  The care management team will reach out to the patient again over the next 7-14 days.   [name] RN, BSN [place]  [employer] manager

## 2020-08-04 ENCOUNTER — Inpatient Hospital Stay: Payer: Medicaid Other | Admitting: Family Medicine

## 2020-08-19 ENCOUNTER — Other Ambulatory Visit: Payer: Self-pay

## 2020-08-19 IMAGING — DX DG CHEST 2V
3 series · 3 of 3 positions shown · non-contrast
Comparison: Chest radiograph and chest CT August 10, 2018

CLINICAL DATA: Shortness of breath

EXAM:
CHEST - 2 VIEW

[chest ap]
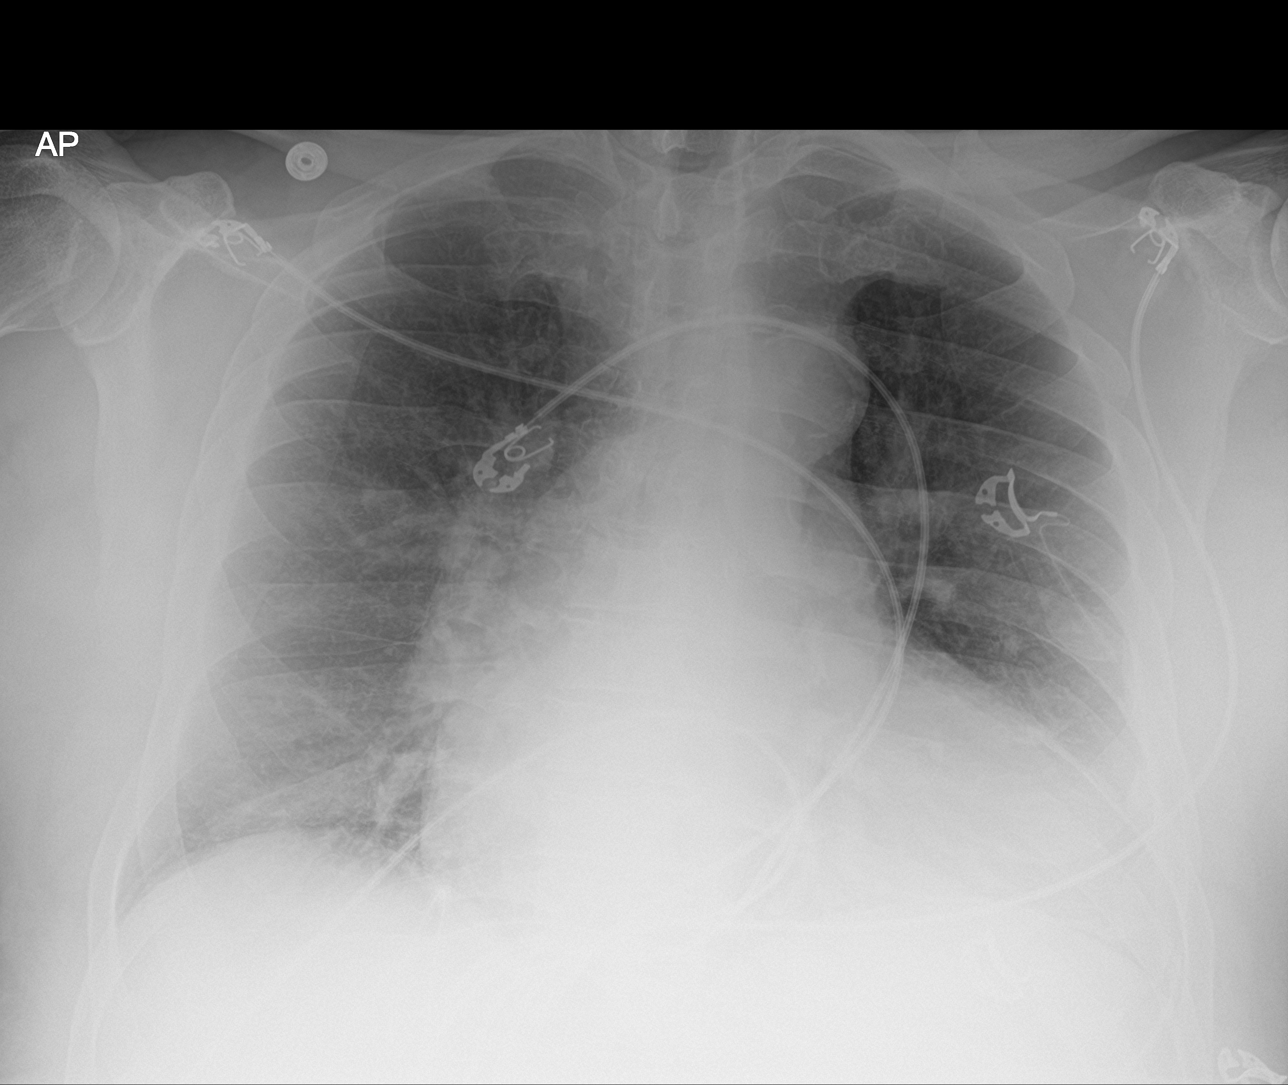

[chest lat (1 of 2)]
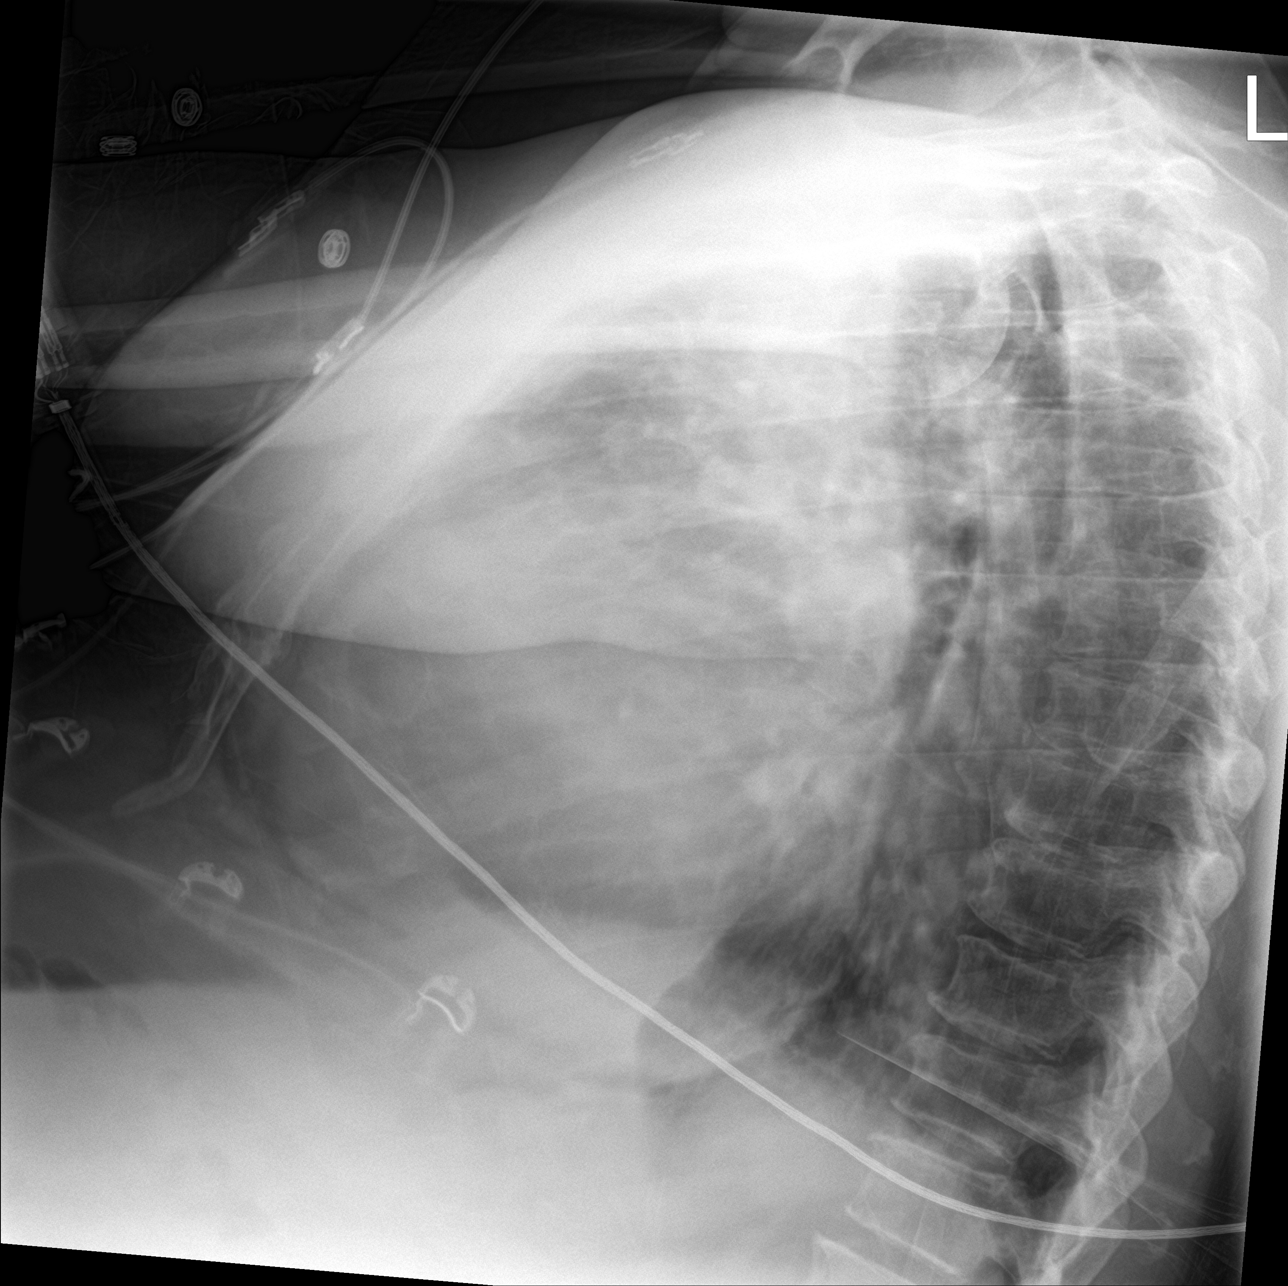

[chest lat (2 of 2)]
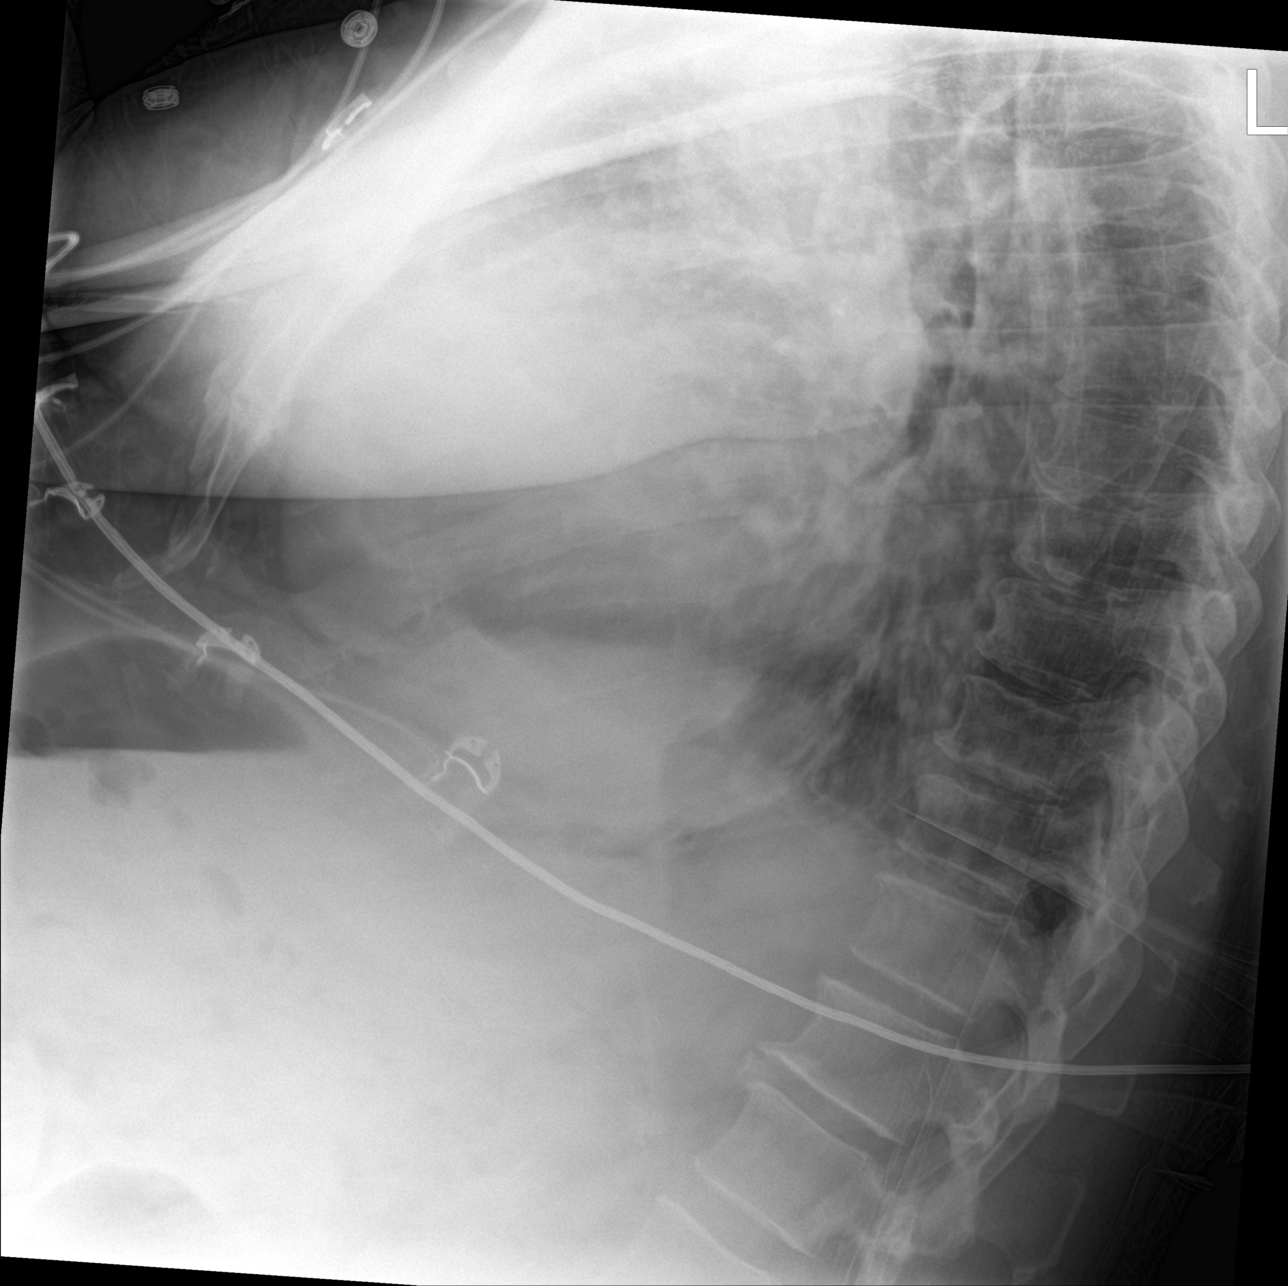

[3 of 3 positions shown; findings below may reference images not displayed]

FINDINGS: There is no appreciable edema or airspace opacity. There is
cardiomegaly with pulmonary vascularity normal. No adenopathy. There
is aortic atherosclerosis. No appreciable bone lesions.
IMPRESSION: Cardiomegaly. No edema or airspace opacity. No evident adenopathy.
Aortic Atherosclerosis (KEKZR-H31.1).

## 2020-08-19 NOTE — Patient Outreach (Signed)
Care Coordination  08/19/2020  Chris [name] 1960/08/08 [id number]   Medicaid Managed Care   Unsuccessful Outreach Note  08/19/2020 Name: Chris [name] MRN: [id number] DOB: May 18, 1961  Referred by: [name], MD Reason for referral : High Risk Managed Medicaid (HR MM Unsuccessful Outreach)   An unsuccessful telephone outreach was attempted today. The patient was referred to the case management team for assistance with care management and care coordination.   Follow Up Plan: The care management team will reach out to the patient again over the next 30 days.   [name], [name], [place]  High Risk Managed Medicaid Team

## 2020-08-19 NOTE — Patient Instructions (Signed)
Visit Information  [name]  - as a part of your Medicaid benefit, you are eligible for care management and care coordination services at no cost or copay. I was unable to reach you by phone today but would be happy to help you with your health related needs. Please feel free to call me @ [phone].   A member of the Managed Medicaid care management team will reach out to you again over the next 30 days.   [name], [name], [place]  High Risk Managed Medicaid Team

## 2020-08-20 ENCOUNTER — Telehealth: Payer: Self-pay | Admitting: Internal Medicine

## 2020-08-20 IMAGING — DX DG CHEST 1V PORT
1 series · 1 of 1 positions shown · non-contrast
Comparison: 10/26/2019

CLINICAL DATA: Shortness of breath

EXAM:
PORTABLE CHEST 1 VIEW

[chest]
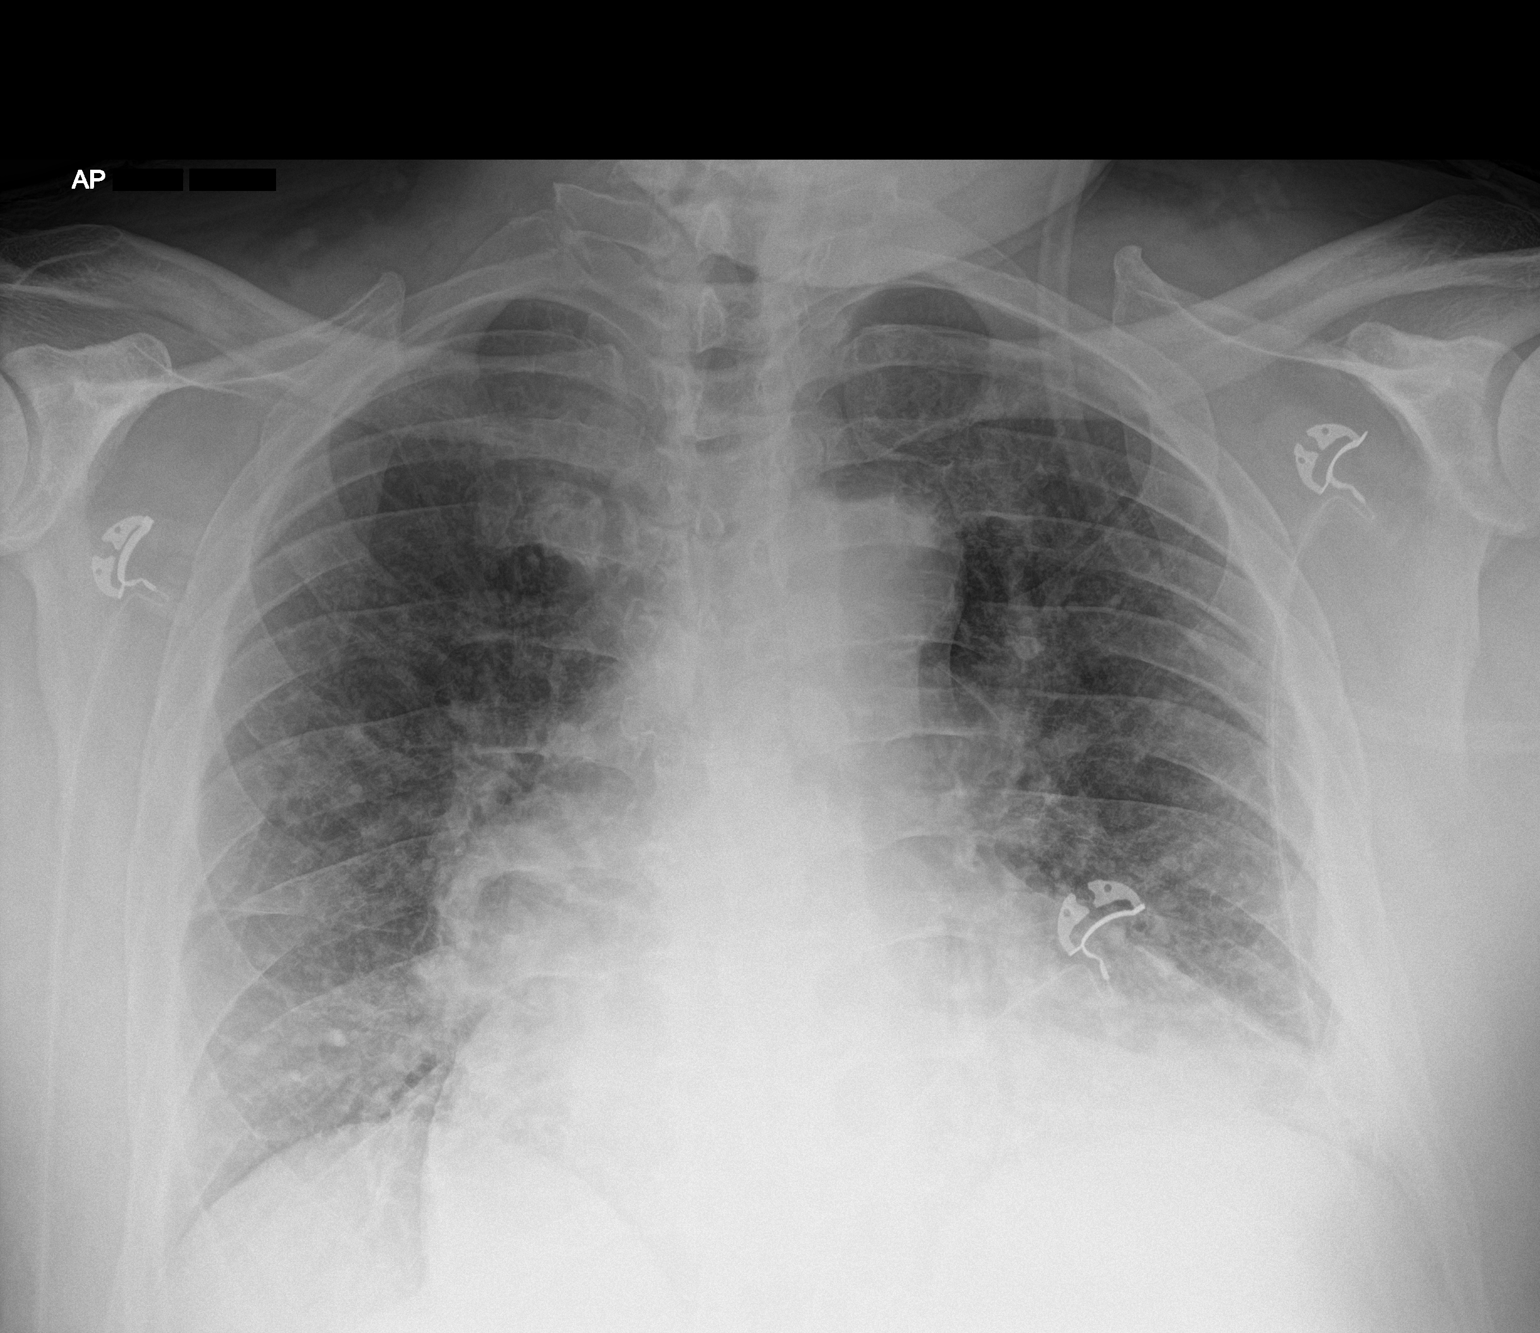

[1 of 1 positions shown; findings below may reference images not displayed]

FINDINGS: Moderate cardiomegaly is unchanged. There is pulmonary vascular
congestion without overt edema. No pleural effusion or pneumothorax.
IMPRESSION: Cardiomegaly and pulmonary vascular congestion without overt edema.

## 2020-08-20 NOTE — Telephone Encounter (Signed)
I reached out to Chris [name] today to get him rescheduled for a phone visit with the Managed Medicaid team but he did not answer. I left my contact infor for him to return my call. I will reach out again in the next 7-14 days.

## 2020-08-27 ENCOUNTER — Telehealth: Payer: Self-pay | Admitting: Family

## 2020-08-27 ENCOUNTER — Ambulatory Visit: Payer: Medicaid Other | Admitting: Family

## 2020-08-27 NOTE — Telephone Encounter (Signed)
Patient did not show for his Heart Failure [facility] appointment on 08/27/20. Will attempt to reschedule.

## 2020-09-04 ENCOUNTER — Ambulatory Visit: Payer: Medicaid Other | Admitting: Internal Medicine

## 2020-09-10 ENCOUNTER — Telehealth: Payer: Self-pay | Admitting: Internal Medicine

## 2020-09-10 NOTE — Telephone Encounter (Signed)
Pt is in detection center in [place]. Chris [name]  said the pt told her dr Wynetta Emery prescribed colchine 0.6 mg and brittany is calling to confirm.

## 2020-09-11 NOTE — Telephone Encounter (Signed)
Will forward to provider  

## 2020-09-12 NOTE — Telephone Encounter (Signed)
Returned [employer]. Dialed the number that is listed and phone kept ringing

## 2020-09-13 ENCOUNTER — Other Ambulatory Visit: Payer: Self-pay

## 2020-09-19 ENCOUNTER — Other Ambulatory Visit: Payer: Self-pay | Admitting: *Deleted

## 2020-09-19 NOTE — Patient Outreach (Signed)
Care Coordination  09/19/2020  Chris [name] [birth year] [id number]    Medicaid Managed Care   Unsuccessful Outreach Note  09/19/2020 Name: Chris [name] MRN: [id number] DOB: 1960-08-18  Referred by: [name], MD Reason for referral : High Risk Managed Medicaid (Unsuccessful RNCM follow up outreach)   An unsuccessful telephone outreach was attempted today. The patient was referred to the case management team for assistance with care management and care coordination.   Follow Up Plan: A HIPAA compliant phone message was left for the patient providing contact information and requesting a return call.  The care management team will reach out to the patient again over the next 7-14 days.   [name] RN, BSN [place]  [employer] manager

## 2020-09-19 NOTE — Patient Instructions (Signed)
Visit Information  [name]  - as a part of your Medicaid benefit, you are eligible for care management and care coordination services at no cost or copay. I was unable to reach you by phone today but would be happy to help you with your health related needs. Please feel free to call me @ [phone].   A member of the Managed Medicaid care management team will reach out to you again over the next 7-14 days.   [name] RN, BSN [place]  [employer] manager

## 2020-09-24 ENCOUNTER — Other Ambulatory Visit: Payer: Self-pay | Admitting: *Deleted

## 2020-09-24 NOTE — Patient Outreach (Signed)
Care Coordination  09/24/2020  HELIO LACK 14-May-1961 [id number]    Medicaid Managed Care   Unsuccessful Outreach Note  09/24/2020 Name: WESTLEY BLASS MRN: [id number] DOB: 06-28-1960  Referred by: [name], MD Reason for referral : High Risk Managed Medicaid (Unsuccessful RNCM follow up outreach)   An unsuccessful telephone outreach was attempted today. The patient was referred to the case management team for assistance with care management and care coordination.   Follow Up Plan: A HIPAA compliant phone message was left for the patient providing contact information and requesting a return call.  The care management team will reach out to the patient again over the next 7-14 days.   [name] RN, BSN [place]  [employer] manager

## 2020-09-24 NOTE — Patient Instructions (Signed)
Visit Information  Mr. DAMIAN BUCKLES  - as a part of your Medicaid benefit, you are eligible for care management and care coordination services at no cost or copay. I was unable to reach you by phone today but would be happy to help you with your health related needs. Please feel free to call me @ [phone].   A member of the Managed Medicaid care management team will reach out to you again over the next 7-14 days.   [name] RN, BSN [place]  [employer] manager

## 2020-10-01 ENCOUNTER — Other Ambulatory Visit: Payer: Self-pay

## 2020-10-01 ENCOUNTER — Other Ambulatory Visit: Payer: Self-pay | Admitting: *Deleted

## 2020-10-01 NOTE — Patient Outreach (Signed)
Care Coordination  10/01/2020  Chris [name] 1961/03/22 [id number]    Medicaid Managed Care   Unsuccessful Outreach Note  10/01/2020 Name: Chris [name] MRN: [id number] DOB: [birth year]  Referred by: [name], MD Reason for referral : High Risk Managed Medicaid (Unsuccessful RNCM follow up outreach)   An unsuccessful telephone outreach was attempted today. The patient was referred to the case management team for assistance with care management and care coordination.   Follow Up Plan: A HIPAA compliant phone message was left for the patient providing contact information and requesting a return call.  The care management team will reach out to the patient again over the next 30 days.   [name] RN, BSN [place]  [employer] manager

## 2020-10-01 NOTE — Patient Instructions (Signed)
Visit Information  Mr. JANE BROUGHTON  - as a part of your Medicaid benefit, you are eligible for care management and care coordination services at no cost or copay. I was unable to reach you by phone today but would be happy to help you with your health related needs. Please feel free to call me @ [phone].   A member of the Managed Medicaid care management team will reach out to you again over the next 30 days.   [name] RN, BSN [place]  [employer] manager

## 2020-10-01 NOTE — Patient Outreach (Addendum)
Medicaid Managed Care   Nurse Care Manager Note  10/01/2020 Name:  Chris [name] MRN:  [id number] DOB:  03-08-1961  Chris [name] is an 60 y.o. year old male who is a primary patient of [name], MD.  The [facility] Managed Care Coordination team was consulted for assistance with:    CHF DMII  [name] was given information about Medicaid Managed Care Coordination team services today. [name] agreed to services and verbal consent obtained.  Engaged with patient by telephone for follow up visit in response to provider referral for case management and/or care coordination services.   Assessments/Interventions:  Review of past medical history, allergies, medications, health status, including review of consultants reports, laboratory and other test data, was performed as part of comprehensive evaluation and provision of chronic care management services.  SDOH (Social Determinants of Health) assessments and interventions performed:   Care Plan  No Known Allergies  Medications Reviewed Today    Reviewed by [name], RN (Registered Nurse) on 10/01/20 at 1608  Med List Status: <None>  Medication Order Taking? Sig Documenting Provider Last Dose Status Informant  Accu-Chek Softclix Lancets lancets [id number] Yes USE AS INSTRUCTED. INJECT INTO THE SKIN ONCE DAILY  Patient taking differently: USE AS INSTRUCTED. INJECT INTO THE SKIN ONCE DAILY   [name], NP Taking Active   albuterol (PROVENTIL) (2.5 MG/3ML) 0.083% nebulizer solution [id number] Yes TAKE 3 MLS BY NEBULIZATION EVERY 6 (SIX) HOURS AS NEEDED FOR SHORTNESS OF BREATH. [name], MD Taking Active            Med Note [facility] [place], [place]   Fri Jul 11, 2020  9:19 AM)    albuterol (VENTOLIN HFA) 108 (90 Base) MCG/ACT inhaler [id number] Yes INHALE 2 PUFFS INTO THE LUNGS EVERY 6 (SIX) HOURS AS NEEDED FOR WHEEZING OR SHORTNESS OF BREATH. [name], MD Taking Active            Med  Note [name], [facility]   Fri Jul 04, 2020  9:48 AM)    allopurinol (ZYLOPRIM) 100 MG tablet [id number] Yes TAKE 2 TABLETS (200 MG TOTAL) BY MOUTH DAILY. [name], MD Taking Active   apixaban (ELIQUIS) 5 MG TABS tablet [id number] Yes TAKE 1 TABLET (5 MG TOTAL) BY MOUTH 2 (TWO) TIMES DAILY. [name], MD Taking Active   aspirin 81 MG EC tablet [id number] Yes TAKE 1 TABLET (81 MG TOTAL) BY MOUTH DAILY. [name], MD Taking Active   atorvastatin (LIPITOR) 40 MG tablet [id number] Yes TAKE 1 TABLET (40 MG TOTAL) BY MOUTH DAILY AT 6 PM.  Patient taking differently: Take by mouth daily. at 6pm   [name], MD Taking Active   budesonide-formoterol [facility]) 160-4.5 MCG/ACT inhaler [id number] Yes INHALE 2 PUFFS INTO THE LUNGS 2 (TWO) TIMES DAILY. [name], MD Taking Active            Med Note [name], [facility]   Fri Jul 04, 2020  9:46 AM)    carvedilol (COREG) 25 MG tablet [id number] Yes TAKE 1 TABLET (25 MG TOTAL) BY MOUTH 2 (TWO) TIMES DAILY. [name], MD Taking Active   diclofenac Sodium (VOLTAREN) 1 % GEL [id number] Yes APPLY 2 G TOPICALLY 4 (FOUR) TIMES DAILY. USE ON ELBOW PAIN [name], MD Taking Active         Discontinued 07/11/20 [phone] (Discontinued by provider)   fluticasone [facility]) 50 MCG/ACT nasal spray [id number] Yes Place 1 spray  into both nostrils daily as needed for allergies or rhinitis. [name], MD Taking Active Pharmacy Records  furosemide (LASIX) 40 MG tablet [id number] Yes TAKE 1 TABLET (40 MG TOTAL) BY MOUTH DAILY. [name], MD Taking Active   glipiZIDE (GLUCOTROL) 5 MG tablet [id number] Yes TAKE 0.5 TABLETS (2.5 MG TOTAL) BY MOUTH 2 (TWO) TIMES DAILY BEFORE A MEAL. [name], MD Taking Active   isosorbide-hydrALAZINE (BIDIL) 20-37.5 MG tablet [id number] Yes TAKE 1 TABLET BY MOUTH 3 (THREE) TIMES DAILY. [name], MD Taking Active         Discontinued 07/11/20 [phone] (Discontinued by provider)    montelukast (SINGULAIR) 10 MG tablet [id number] Yes TAKE 1 TABLET (10 MG TOTAL) BY MOUTH AT BEDTIME. [name], MD Taking Active   predniSONE (DELTASONE) 20 MG tablet [id number] Yes TAKE 2 TABLETS BY MOUTH DAILY FOR 3 DAYS, THEN 1.5 TABLETS FOR 3 DAYS, THEN 1 TABLET FOR 3 DAYS THEN 1/2 TABLET FOR 3 DAYS [name], MD Taking Active   sodium polystyrene (KAYEXALATE) 15 GM/60ML suspension [id number] Yes TAKE 60 MLS (15 G TOTAL) BY MOUTH ONCE FOR 1 DOSE. [name], MD Taking Active   sodium zirconium cyclosilicate (LOKELMA) 5 g packet [id number] Yes TAKE 5 G BY MOUTH EVERY OTHER DAY FOR 2 DOSES. [name], MD Taking Active           Patient Active Problem List   Diagnosis Date Noted  . COPD exacerbation ([place])   . Typical atrial flutter ([place])   . Acute respiratory failure with hypoxia ([place]) 07/05/2020  . CHF (congestive heart failure) ([place]) 07/04/2020  . Acute exacerbation of CHF (congestive heart failure) ([place]) 06/16/2020  . COPD with acute exacerbation ([place]) 06/16/2020  . Influenza vaccine refused 05/06/2020  . 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 Acute decompensated heart failure ([place]) 05/04/2020  . Illiteracy 05/04/2020  . Chronic respiratory failure with hypoxia, on home oxygen therapy ([place]) 12/28/2019  . Type 2 diabetes mellitus with stage 3 chronic kidney disease ([place]) 12/25/2019  . Acute and chronic respiratory failure (acute-on-chronic) ([place]) 12/25/2019  . Acute on chronic combined systolic and diastolic CHF (congestive heart failure) ([place]) 10/26/2019  . Elevated troponin I level 10/26/2019  . Acute on chronic diastolic (congestive) heart failure ([place]) 10/26/2019  . History of gout 02/01/2019  . Seasonal allergic rhinitis due to pollen 02/01/2019  . Tobacco dependence 11/30/2018  . Microscopic hematuria 11/30/2018  . Depression 11/30/2018  . Difficulty controlling anger 11/30/2018   . CAP (community acquired pneumonia) 08/11/2018  . COPD (chronic obstructive pulmonary disease) ([place])   . CKD (chronic kidney disease) stage 3, GFR 30-59 ml/min (HCC) 08/10/2018  . Recurrent epistaxis 04/21/2018  . Mixed hyperlipidemia 07/28/2017  . Essential hypertension 07/28/2017  . Chronic systolic heart failure ([place]) 10/25/2014  . Cocaine abuse ([place]) 02/20/2013  . Cannabis abuse 02/20/2013  . Back pain, chronic 02/20/2013    Conditions to be addressed/monitored per PCP order:  CHF and DMII  Care Plan : Heart Failure (Adult)  Updates made by [name], RN since 10/01/2020 12:00 AM    Problem: Symptom Exacerbation (Heart Failure)     Long-Range Goal: Symptom Exacerbation Prevented or Minimized   Start Date: 07/02/2020  Expected End Date: 12/01/2020  Recent Progress: On track  Priority: High  Note:   Current Barriers:  . Chronic Disease Management, support and educational needs related to management of multiple chronic diagnosis.  Mr. Schurman is managing CHF, DMII, stage III CKD and chronic respiratory failure. He missed his last appointment with his PCP and cardiologist. He reports eating right, checking his blood sugar daily and taking all of his medications. He recently moved and is living with his brother, desires his own place.  Nurse Case Manager Clinical Goal(s):  . patient will work with Jackelyn Poling [facility] to address needs related to housing . patient will meet with RN Care Manager to address needs related to managing CHF and DMII . patient will demonstrate improved health management independence as evidenced bytaking prescribed medications, reporting any changes in symptoms to PCP such as swelling, changes in breathing, new cough . Patient will work with [facility] pharmacist, Ovid Curd for medication management  Interventions:  . Inter-disciplinary care team collaboration (see longitudinal plan of care) . Evaluation of current treatment plan related to  CHF and DMII and patient's adherence to plan as established by provider. . Advised patient to follow up with PCP and assisted with procuring follow up appointment for 11/12/20 @ 11:10 . Encouraged patient to schedule follow up cardiology appointment . Reviewed medications with patient  . Collaborated with [name] regarding housing need . Discussed plans with patient for ongoing care management follow up and provided patient with direct contact information for care management team . Advised patient, providing education and rationale, to weigh daily and record, calling PCP for weight gain of 3lbs overnight or 5 pounds in a week.  [name] referral for medication review  Patient Goals/Self-Care Activities Over the next 30 days, patient will: -Call to fill prescriptions one week before I run out of medication -Take all medications as prescribed - call office if I gain more than 2 pounds in one day or 5 pounds in one week - use salt in moderation - watch for swelling in feet, ankles and legs every day  - eat more whole grains, fruits and vegetables, lean meats and healthy fats - know when to call the doctor - track symptoms and what helps feel better or worse - dress right for the weather, hot or cold   Follow Up Plan: Telephone follow up appointment with Managed Medicaid care management team member scheduled for:10/29/20 @ 9am        Follow Up:  Patient agrees to Care Plan and Follow-up.  Plan: The Managed Medicaid care management team will reach out to the patient again over the next 30 days.  Date/time of next scheduled RN care management/care coordination outreach: 10/29/20 @ Ray RN, [place] RN Care Coordinator
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
## 2020-10-02 ENCOUNTER — Telehealth: Payer: Self-pay | Admitting: Internal Medicine

## 2020-10-02 ENCOUNTER — Other Ambulatory Visit: Payer: Self-pay

## 2020-10-02 MED FILL — Albuterol Sulfate Inhal Aero 108 MCG/ACT (90MCG Base Equiv): RESPIRATORY_TRACT | 25 days supply | Qty: 8.5 | Fill #0 | Status: AC

## 2020-10-02 MED FILL — Montelukast Sodium Tab 10 MG (Base Equiv): ORAL | 30 days supply | Qty: 30 | Fill #0 | Status: AC

## 2020-10-02 MED FILL — Glipizide Tab 5 MG: ORAL | 30 days supply | Qty: 30 | Fill #0 | Status: AC

## 2020-10-02 MED FILL — Carvedilol Tab 25 MG: ORAL | 30 days supply | Qty: 60 | Fill #0 | Status: AC

## 2020-10-02 MED FILL — Isosorbide Dinitrate-Hydralazine HCl Tab 20-37.5 MG: ORAL | 30 days supply | Qty: 90 | Fill #0 | Status: CN

## 2020-10-02 MED FILL — Allopurinol Tab 100 MG: ORAL | 30 days supply | Qty: 60 | Fill #0 | Status: AC

## 2020-10-02 MED FILL — Furosemide Tab 40 MG: ORAL | 30 days supply | Qty: 30 | Fill #0 | Status: AC

## 2020-10-02 MED FILL — Apixaban Tab 5 MG: ORAL | 30 days supply | Qty: 60 | Fill #0 | Status: AC
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
## 2020-10-03 ENCOUNTER — Other Ambulatory Visit: Payer: Self-pay

## 2020-10-03 DIAGNOSIS — J449 Chronic obstructive pulmonary disease, unspecified: Secondary | ICD-10-CM | POA: Diagnosis not present

## 2020-10-03 MED FILL — Albuterol Sulfate Soln Nebu 0.083% (2.5 MG/3ML): RESPIRATORY_TRACT | 7 days supply | Qty: 90 | Fill #0 | Status: AC

## 2020-10-03 NOTE — Patient Outreach (Signed)
Medicaid Managed Care    Pharmacy Note  10/03/2020 Name: BRADY SCHILLER MRN: [id number] DOB: 16-Nov-1960  HILMAR MOLDOVAN is a 60 y.o. year old male who is a primary care patient of [name], MD. The [facility] Managed Care Coordination team was consulted for assistance with disease management and care coordination needs.    Engaged with patient Engaged with patient by telephone for initial visit in response to referral for case management and/or care coordination services.  Mr. Jares was given information about Managed Medicaid Care Coordination team services today. [name] agreed to services and verbal consent obtained.   Objective:  Lab Results  Component Value Date   CREATININE 2.14 (H) 07/09/2020   CREATININE 1.83 (H) 07/08/2020   CREATININE 2.00 (H) 07/07/2020    Lab Results  Component Value Date   HGBA1C 6.4 (H) 07/05/2020       Component Value Date/Time   CHOL 176 12/25/2019 0632   CHOL 160 11/07/2019 0918   CHOL 157 10/21/2013 0435   TRIG 91 12/25/2019 0632   TRIG 117 10/21/2013 0435   HDL 37 (L) 12/25/2019 0632   HDL 40 11/07/2019 0918   HDL 30 (L) 10/21/2013 0435   CHOLHDL 4.8 12/25/2019 0632   VLDL 18 12/25/2019 0632   VLDL 23 10/21/2013 0435   LDLCALC 121 (H) 12/25/2019 0632   LDLCALC 92 11/07/2019 0918   LDLCALC 104 (H) 10/21/2013 0435    Other: (TSH, CBC, Vit D, etc.)  Clinical ASCVD: Yes  The 10-year ASCVD risk score [name], et al., 2013) is: 36.1%   Values used to calculate the score:     Age: 36 years     Sex: Male     Is Non-Hispanic African American: Yes     Diabetic: Yes     Tobacco smoker: Yes     Systolic Blood Pressure: 009 mmHg     Is BP treated: Yes     HDL Cholesterol: 37 mg/dL     Total Cholesterol: 176 mg/dL    Other: (CHADS2VASc if Afib, PHQ9 if depression, MMRC or CAT for COPD, ACT, DEXA)  BP Readings from Last 3 Encounters:  07/17/20 126/90  07/11/20 130/80  07/10/20 (!) 160/100     Assessment/Interventions: Review of patient past medical history, allergies, medications, health status, including review of consultants reports, laboratory and other test data, was performed as part of comprehensive evaluation and provision of chronic care management services.   4/21/22Delsa Sale, scheduler, called me to tell me patient had been without medications for days and was currently having issues breathing. Called patients pharmacy, got refills ready for 3PM from [place], and patient affirmed he'd pick up  10/03/20: Patient does not know what the meds he takes are, what they're for, or what his disease states are. He states his first priority is housing because he doesn't have a place to call his own. Per Hx, Ubaldo Glassing tried calling him but he's not returned her calls. I offered to schedule them together but patient states they don't get along. He does like Threasa Beards, who called him 10/01/20.  Plan: Will coordinate with Threasa Beards to figure out housing access. Will f/u in 2 weeks to go over meds more in-depth at that point. First priority is housing since he has made it clear taking meds isn't a priority. Will focus on his priorities and once they are sorted out, can go over medical priorities after  SDOH (Social Determinants of Health) assessments and interventions performed:  Care Plan  No Known Allergies  Medications Reviewed Today    Reviewed by [name], RN (Registered Nurse) on 10/01/20 at 1608  Med List Status: <None>  Medication Order Taking? Sig Documenting Provider Last Dose Status Informant  Accu-Chek Softclix Lancets lancets [id number] Yes USE AS INSTRUCTED. INJECT INTO THE SKIN ONCE DAILY  Patient taking differently: USE AS INSTRUCTED. INJECT INTO THE SKIN ONCE DAILY   [name], NP Taking Active   albuterol (PROVENTIL) (2.5 MG/3ML) 0.083% nebulizer solution [id number] Yes TAKE 3 MLS BY NEBULIZATION EVERY 6 (SIX) HOURS AS NEEDED FOR SHORTNESS OF BREATH. [name], MD Taking Active            Med Note [facility] [place], [place]er   Fri Jul 11, 2020  9:19 AM)    albuterol (VENTOLIN HFA) 108 (90 Base) MCG/ACT inhaler [id number] Yes INHALE 2 PUFFS INTO THE LUNGS EVERY 6 (SIX) HOURS AS NEEDED FOR WHEEZING OR SHORTNESS OF BREATH. [name], MD Taking Active            Med Note [name], [facility]   Fri Jul 04, 2020  9:48 AM)    allopurinol (ZYLOPRIM) 100 MG tablet [id number] Yes TAKE 2 TABLETS (200 MG TOTAL) BY MOUTH DAILY. [name], MD Taking Active   apixaban (ELIQUIS) 5 MG TABS tablet [id number] Yes TAKE 1 TABLET (5 MG TOTAL) BY MOUTH 2 (TWO) TIMES DAILY. [name], MD Taking Active   aspirin 81 MG EC tablet [id number] Yes TAKE 1 TABLET (81 MG TOTAL) BY MOUTH DAILY. [name], MD Taking Active   atorvastatin (LIPITOR) 40 MG tablet [id number] Yes TAKE 1 TABLET (40 MG TOTAL) BY MOUTH DAILY AT 6 PM.  Patient taking differently: Take by mouth daily. at 6pm   [name], MD Taking Active   budesonide-formoterol [facility]) 160-4.5 MCG/ACT inhaler [id number] Yes INHALE 2 PUFFS INTO THE LUNGS 2 (TWO) TIMES DAILY. [name], MD Taking Active            Med Note [name], [facility]   Fri Jul 04, 2020  9:46 AM)    carvedilol (COREG) 25 MG tablet [id number] Yes TAKE 1 TABLET (25 MG TOTAL) BY MOUTH 2 (TWO) TIMES DAILY. [name], MD Taking Active   diclofenac Sodium (VOLTAREN) 1 % GEL [id number] Yes APPLY 2 G TOPICALLY 4 (FOUR) TIMES DAILY. USE ON ELBOW PAIN [name], MD Taking Active         Discontinued 07/11/20 [phone] (Discontinued by provider)   fluticasone Asencion Islam) 50 MCG/ACT nasal spray [id number] Yes Place 1 spray into both nostrils daily as needed for allergies or rhinitis. [name], MD Taking Active Pharmacy Records  furosemide (LASIX) 40 MG tablet [id number] Yes TAKE 1 TABLET (40 MG TOTAL) BY MOUTH DAILY. [name], MD Taking Active   glipiZIDE (GLUCOTROL) 5 MG tablet [id number] Yes TAKE  0.5 TABLETS (2.5 MG TOTAL) BY MOUTH 2 (TWO) TIMES DAILY BEFORE A MEAL. [name], MD Taking Active   isosorbide-hydrALAZINE (BIDIL) 20-37.5 MG tablet [id number] Yes TAKE 1 TABLET BY MOUTH 3 (THREE) TIMES DAILY. [name], MD Taking Active         Discontinued 07/11/20 [phone] (Discontinued by provider)   montelukast (SINGULAIR) 10 MG tablet [id number] Yes TAKE 1 TABLET (10 MG TOTAL) BY MOUTH AT BEDTIME. [name], MD Taking Active   predniSONE (DELTASONE) 20 MG tablet [id number] Yes TAKE 2 TABLETS BY MOUTH DAILY FOR 3 DAYS, THEN 1.5  TABLETS FOR 3 DAYS, THEN 1 TABLET FOR 3 DAYS THEN 1/2 TABLET FOR 3 DAYS [name], MD Taking Active   sodium polystyrene (KAYEXALATE) 15 GM/60ML suspension [id number] Yes TAKE 60 MLS (15 G TOTAL) BY MOUTH ONCE FOR 1 DOSE. [name], MD Taking Active   sodium zirconium cyclosilicate (LOKELMA) 5 g packet [id number] Yes TAKE 5 G BY MOUTH EVERY OTHER DAY FOR 2 DOSES. [name], MD Taking Active           Patient Active Problem List   Diagnosis Date Noted  . COPD exacerbation ([place])   . Typical atrial flutter ([place])   . Acute respiratory failure with hypoxia ([place]) 07/05/2020  . CHF (congestive heart failure) ([place]) 07/04/2020  . Acute exacerbation of CHF (congestive heart failure) ([employer]) 06/16/2020  . COPD with acute exacerbation ([place]) 06/16/2020  . Influenza vaccine refused 05/06/2020  . Acute on chronic combined systolic (congestive) and diastolic (congestive) heart failure ([place]) 05/05/2020  . Acute decompensated heart failure ([place]) 05/04/2020  . Illiteracy 05/04/2020  . Chronic respiratory failure with hypoxia, on home oxygen therapy ([place]) 12/28/2019  . Type 2 diabetes mellitus with stage 3 chronic kidney disease ([place]) 12/25/2019  . Acute and chronic respiratory failure (acute-on-chronic) ([place]) 12/25/2019  . Acute on chronic combined systolic and diastolic CHF (congestive heart failure) ([place]) 10/26/2019  . Elevated troponin I  level 10/26/2019  . Acute on chronic diastolic (congestive) heart failure ([place]) 10/26/2019  . History of gout 02/01/2019  . Seasonal allergic rhinitis due to pollen 02/01/2019  . Tobacco dependence 11/30/2018  . Microscopic hematuria 11/30/2018  . Depression 11/30/2018  . Difficulty controlling anger 11/30/2018  . CAP (community acquired pneumonia) 08/11/2018  . COPD (chronic obstructive pulmonary disease) ([place])   . CKD (chronic kidney disease) stage 3, GFR 30-59 ml/min (HCC) 08/10/2018  . Recurrent epistaxis 04/21/2018  . Mixed hyperlipidemia 07/28/2017  . Essential hypertension 07/28/2017  . Chronic systolic heart failure ([place]) 10/25/2014  . Cocaine abuse ([place]) 02/20/2013  . Cannabis abuse 02/20/2013  . Back pain, chronic 02/20/2013    Conditions to be addressed/monitored: HTN, Hypertriglyceridemia and DM  Care Plan : Medication Management  Updates made by Lane Hacker, [place] since 10/03/2020 12:00 AM    Problem: Health Promotion or Disease Self-Management (General Plan of Care)     Goal: Medication Management   Note:   Current Barriers:  . Does not adhere to prescribed medication regimen . Does not maintain contact with provider office . Does not contact provider office for questions/concerns .   Pharmacist Clinical Goal(s):  [name] Over the next 30 days, patient will contact provider office for questions/concerns as evidenced notation of same in electronic health record through collaboration with PharmD and provider.  .   Interventions: . Inter-disciplinary care team collaboration (see longitudinal plan of care) . Comprehensive medication review performed; medication list updated in electronic medical record  @RXCPDIABETES @ @RXCPHYPERTENSION @ @RXCPHYPERLIPIDEMIA @  Patient Goals/Self-Care Activities . Over the next 30 days, patient will:  - collaborate with provider on medication access solutions  Follow Up Plan: The care management team will reach out to the  patient again over the next 30 days.     Task: Mutually Develop and Royce Macadamia Achievement of Patient Goals   Note:   Care Management Activities:    - verbalization of feelings encouraged    Notes:      Medication Assistance: None required. Patient affirms current coverage meets needs.   Follow up: Agree   Plan: The care  management team will reach out to the patient again over the next 30 days.   [place] Constable, Pharm.D., Managed Medicaid Pharmacist - [phone]

## 2020-10-03 NOTE — Telephone Encounter (Signed)
Pt came by the office today and picked up new rxs and a new nebulizer machine.

## 2020-10-03 NOTE — Patient Instructions (Signed)
Visit Information  Mr. Beaumier was given information about Medicaid Managed Care team care coordination services as a part of their Healthy [facility] Medicaid benefit. TREYVON BLAHUT verbally consented to engagement with the [facility] Managed Care team.   For questions related to your Healthy [facility] health plan, please call: [phone] or visit the homepage here: [email or web address]  If you would like to schedule transportation through your Healthy [facility] plan, please call the following number at least 2 days in advance of your appointment: [phone]   Call the [facility] at [phone], at any time, 24 hours a day, 7 days a week. If you are in danger or need immediate medical attention call 911.  Mr. Lia - following are the goals we discussed in your visit today:  Goals Addressed            This Visit's Progress   . Manage My Medicine       Timeframe:  Short-Term Goal Priority:  High Start Date:                             Expected End Date:                       Follow Up Date Monthly    - call for medicine refill 2 or 3 days before it runs out - call if I am sick and can't take my medicine - learn to read medicine labels    Why is this important?   . These steps will help you keep on track with your medicines.   Notes:        Please see education materials related to DM provided as print materials.   Patient verbalizes understanding of instructions provided today.   The Managed Medicaid care management team will reach out to the patient again over the next 30 days.   [place] Constable, Pharm.D., Managed Medicaid Pharmacist [phone]   Following is a copy of your plan of care:  Patient Care Plan: Heart Failure (Adult)    Problem Identified: Symptom Exacerbation (Heart Failure)     Long-Range Goal: Symptom Exacerbation Prevented or Minimized   Start Date: 07/02/2020  Expected End Date:  12/01/2020  Recent Progress: On track  Priority: High  Note:   Current Barriers:  . Chronic Disease Management, support and educational needs related to management of multiple chronic diagnosis. Mr. Keefe is managing CHF, DMII, stage III CKD and chronic respiratory failure. He missed his last appointment with his PCP and cardiologist. He reports eating right, checking his blood sugar daily and taking all of his medications. He recently moved and is living with his brother, desires his own place.  Nurse Case Manager Clinical Goal(s):  . patient will work with Jackelyn Poling [facility] to address needs related to housing . patient will meet with RN Care Manager to address needs related to managing CHF and DMII . patient will demonstrate improved health management independence as evidenced bytaking prescribed medications, reporting any changes in symptoms to PCP such as swelling, changes in breathing, new cough . Patient will work with MM pharmacist, Ovid Curd for medication management  Interventions:  . Inter-disciplinary care team collaboration (see longitudinal plan of care) . Evaluation of current treatment plan related to CHF and DMII and patient's adherence to plan as established by provider. . Advised patient to follow up with PCP and assisted with procuring follow up appointment  for 11/12/20 @ 11:10 . Encouraged patient to schedule follow up cardiology appointment . Reviewed medications with patient  . Collaborated with [name] regarding housing need . Discussed plans with patient for ongoing care management follow up and provided patient with direct contact information for care management team . Advised patient, providing education and rationale, to weigh daily and record, calling PCP for weight gain of 3lbs overnight or 5 pounds in a week.  [name] Pharmacy referral for medication review  Patient Goals/Self-Care Activities Over the next 30 days, patient will: -Call to fill  prescriptions one week before I run out of medication -Take all medications as prescribed - call office if I gain more than 2 pounds in one day or 5 pounds in one week - use salt in moderation - watch for swelling in feet, ankles and legs every day  - eat more whole grains, fruits and vegetables, lean meats and healthy fats - know when to call the doctor - track symptoms and what helps feel better or worse - dress right for the weather, hot or cold   Follow Up Plan: Telephone follow up appointment with Managed Medicaid care management team member scheduled for:10/29/20 @ 9am      Patient Care Plan: General Social Work (Adult)    Problem Identified: Homelessness   Onset Date: 07/09/2020  Note:   JAIVION KINGSLEY is a 60 y.o. year old male who sees [name], MD for primary care. The  Medicaid Managed Care team was consulted for assistance with Housing barriers. Mr. Placeres was given information about Care Management services, agreed to services, and verbal consent for services was obtained.  Interventions:  . Patient interviewed and appropriate assessments performed . Collaborated with clinical team regarding patient needs  . SDOH (Social Determinants of Health) assessments performed: Yes .     [name] Provided patient with information about the [facility]. [name] contacted [facility] to complete a referral for patient to get into the homeless motel. [name] left a voicemail. [name] informed patient that the process can take a long time. Patient states he does not know why no one wants to help him. [name] informed patient he may be able to go to a shelter in [facility], patient states he does not feel safe in [place].  [name] contacted [employer] in [place] [phone] and left a voicemail for the intake coordinator. . [name] contacted [place] to speak with a Education officer, museum but kept getting transferred.  . Patient states he does not have any money to get a room and become  frustrated again stating that we "[place]"do not want to help him. [name] informed patient that she will wait for the [facility] and/or the [facility] to contact her back. [name] informed patient she could provide him with the information for [facility] but patient did not want to call. [name] Update 07/22/20: Patient stated he is doing okay since his surgery but still wants somewhere else to live. Patient stated he knows of a boarding house on [address] and would like SW to help him get in it. SW researched and could not find a boarding house on [address], Patient stated he would get the name of it and contact [name] with the information.  Plan:  . Over the next 30-60 days, patient will work with [name] to address needs related to Housing barriers . Social Worker will continue to follow up with patient.   [name], [name], [place]  High Risk Managed Medicaid Team         Patient Care Plan: Medication Management    Problem Identified: Health Promotion or Disease Self-Management (General Plan of Care)     Goal: Medication Management   Note:   Current Barriers:  . Does not adhere to prescribed medication regimen . Does not maintain contact with provider office . Does not contact provider office for questions/concerns .   Pharmacist Clinical Goal(s):  [name] Over the next 30 days, patient will contact provider office for questions/concerns as evidenced notation of same in electronic health record through collaboration with PharmD and provider.  .   Interventions: . Inter-disciplinary care team collaboration (see longitudinal plan of care) . Comprehensive medication review performed; medication list updated in electronic medical record  @RXCPDIABETES @ @RXCPHYPERTENSION @ @RXCPHYPERLIPIDEMIA @  Patient Goals/Self-Care Activities . Over the next 30 days, patient will:  - collaborate with provider on medication access solutions  Follow Up Plan: The care  management team will reach out to the patient again over the next 30 days.     Task: Mutually Develop and Royce Macadamia Achievement of Patient Goals   Note:   Care Management Activities:    - verbalization of feelings encouraged    Notes:

## 2020-10-06 ENCOUNTER — Other Ambulatory Visit: Payer: Self-pay

## 2020-10-06 MED FILL — Isosorbide Dinitrate-Hydralazine HCl Tab 20-37.5 MG: ORAL | 30 days supply | Qty: 90 | Fill #0 | Status: AC

## 2020-10-07 ENCOUNTER — Ambulatory Visit: Payer: Self-pay

## 2020-10-07 ENCOUNTER — Other Ambulatory Visit: Payer: Self-pay | Admitting: Licensed Clinical Social Worker

## 2020-10-07 NOTE — Patient Instructions (Signed)
Visit Information  Chris [name] was given information about Medicaid Managed Care team care coordination services as a part of their Healthy [facility] Medicaid benefit. Chris [name] verbally consented to engagement with the [facility] Managed Care team.   For questions related to your Healthy [facility] health plan, please call: [phone] or visit the homepage here: [email or web address]  If you would like to schedule transportation through your Healthy [facility] plan, please call the following number at least 2 days in advance of your appointment: [phone]   Call the [facility] at [phone], at any time, 24 hours a day, 7 days a week. If you are in danger or need immediate medical attention call 911.  Chris [name] - following are the goals we discussed in your visit today:  Goals Addressed            This Visit's Progress   . Manage My Emotions       Timeframe:  Long-Range Goal Priority:  High Start Date:    10/07/20                        Expected End Date:  12/07/20                    Follow Up Date 10/13/20   - begin personal counseling - call and visit an old friend - check out volunteer opportunities - join a support group - laugh; watch a funny movie or comedian - learn and use visualization or guided imagery - perform a random act of kindness - practice relaxation or meditation daily - start or continue a personal journal - talk about feelings with a friend, family or spiritual advisor - practice positive thinking and self-talk    Why is this important?    When you are stressed, down or upset, your body reacts too.   For example, your blood pressure may get higher; you may have a headache or stomachache.   When your emotions get the best of you, your body's ability to fight off cold and flu gets weak.   These steps will help you manage your emotions.   Current Barriers:  . Chronic Mental Health needs  related to depression and need for housing . Limited social support, Housing barriers, and Mental Health Concerns  . Social Isolation . ADL IADL limitations . Suicidal Ideation/Homicidal Ideation: No  Clinical Social Work Goal(s):  [name] Over the next 120 days, patient will work with SW monthly by telephone or in person to reduce or manage symptoms related to depression . patient will work with [name] to address needs related to finding stable housing but patient was made aware that housing resources are very limited at this time  Interventions: . Patient interviewed and appropriate assessments performed: brief mental health assessment . PHQ 2 . PHQ 9 SDOH Interventions    Flowsheet Row Most Recent Value  SDOH Interventions   SDOH Interventions for the Following Domains Housing  Housing Interventions Other (Comment)  [Referral for housing support]  Depression Interventions/Treatment  Patient refuses Treatment    .   [name] Patient interviewed and appropriate assessments performed . Discussed plans with patient for ongoing care management follow up and provided patient with direct contact information for care management team . Assisted patient/caregiver with obtaining information about health plan benefits . Encouraged patient to consider a mental health provider for long term follow up and therapy/counseling but patient declined. He reports that his main  source of depression is from not being able to find a place on his own. He is currently stable and residing with his brother and his brother's family in their home. Patient reports that his brother leaves for work at 3 pm during the week and gets back home around 1 am. [employer] number provided to patient.  [name] [facility] LCSW completed care coordination with [facility] [name] on 10/07/20 who will follow up with patient by the end of this week. . Patient reports recent agitation due to his inability to secure housing on this own. [facility] LCSW provided  emotional support and brief housing resource education. Patient is currently not on any public housing wait list.   . Solution-Focused Strategies, Mindfulness or Relaxation Training, Active listening / Reflection utilized , Emotional Supportive Provided, and Verbalization of feelings encouraged  . Patient denies any current substance use  Patient Self Care Activities:  . Ability for insight . Independent living . Strong family or social support  Patient Coping Strengths:  . Conservator, museum/gallery . Able to Communicate Effectively  Patient Self Care Deficits:  . Lacks social connections  Initial goal documentation  Depression screen [facility] 2/9 10/07/2020 11/01/2019 09/11/2019 07/09/2019 04/04/2019  Decreased Interest 2 2 0 1 3  Down, Depressed, Hopeless 2 3 1 1 3   PHQ - 2 Score 4 5 1 2 6   Altered sleeping 1 3 3  0 3  Tired, decreased energy 3 2 0 1 3  Change in appetite 0 - 0 1 0  Feeling bad or failure about yourself  2 2 0 0 3  Trouble concentrating 2 2 0 0 3  Moving slowly or fidgety/restless 0 - 0 0 3  Suicidal thoughts 0 0 0 0 0  PHQ-9 Score 12 14 4 4 21   Difficult doing work/chores Somewhat difficult - - - Extremely dIfficult               Eula Fried, [name], MSW, [employer] Managed Medicaid LCSW [email or web address] Phone: [phone]

## 2020-10-07 NOTE — Patient Outreach (Signed)
Medicaid Managed Care Social Work Note  10/07/2020 Name:  FRANCK TARMAN MRN:  [id number] DOB:  1961-05-18  MACSON HINGER is an 60 y.o. year old male who is a primary patient of [name], MD.  The Medicaid Managed Care Coordination team was consulted for assistance with:  Community Resources  Mental Health Counseling and Resources  Mr. Haydu was given information about Medicaid Managed CareCoordination services today. [name] agreed to services and verbal consent obtained.  Engaged with patient  for by telephone forinitial visit in response to referral for case management and/or care coordination services.   Assessments/Interventions:  Review of past medical history, allergies, medications, health status, including review of consultants reports, laboratory and other test data, was performed as part of comprehensive evaluation and provision of chronic care management services.  SDOH: (Social Determinant of Health) assessments and interventions performed: SDOH Interventions   Flowsheet Row Most Recent Value  SDOH Interventions   SDOH Interventions for the Following Domains Housing  Housing Interventions Other (Comment)  [Referral for housing support]  Depression Interventions/Treatment  Patient refuses Treatment      Advanced Directives Status:  Not addressed in this encounter.  Care Plan                 No Known Allergies  Medications Reviewed Today    Reviewed by [name], RN (Registered Nurse) on 10/01/20 at 1608  Med List Status: <None>  Medication Order Taking? Sig Documenting Provider Last Dose Status Informant  Accu-Chek Softclix Lancets lancets [id number] Yes USE AS INSTRUCTED. INJECT INTO THE SKIN ONCE DAILY  Patient taking differently: USE AS INSTRUCTED. INJECT INTO THE SKIN ONCE DAILY   Claiborne Rigg, NP Taking Active   albuterol (PROVENTIL) (2.5 MG/3ML) 0.083% nebulizer solution [id number] Yes TAKE 3 MLS BY NEBULIZATION EVERY 6 (SIX) HOURS  AS NEEDED FOR SHORTNESS OF BREATH. [name], MD Taking Active            Med Note [facility], [place]ummage   Fri Jul 11, 2020  9:19 AM)    albuterol (VENTOLIN HFA) 108 (90 Base) MCG/ACT inhaler [id number] Yes INHALE 2 PUFFS INTO THE LUNGS EVERY 6 (SIX) HOURS AS NEEDED FOR WHEEZING OR SHORTNESS OF BREATH. [name], MD Taking Active            Med Note Juanetta Gosling, [facility]   Fri Jul 04, 2020  9:48 AM)    allopurinol (ZYLOPRIM) 100 MG tablet [id number] Yes TAKE 2 TABLETS (200 MG TOTAL) BY MOUTH DAILY. [name], MD Taking Active   apixaban (ELIQUIS) 5 MG TABS tablet [id number] Yes TAKE 1 TABLET (5 MG TOTAL) BY MOUTH 2 (TWO) TIMES DAILY. [name], MD Taking Active   aspirin 81 MG EC tablet [id number] Yes TAKE 1 TABLET (81 MG TOTAL) BY MOUTH DAILY. [name], MD Taking Active   atorvastatin (LIPITOR) 40 MG tablet [id number] Yes TAKE 1 TABLET (40 MG TOTAL) BY MOUTH DAILY AT 6 PM.  Patient taking differently: Take by mouth daily. at 6pm   [name], MD Taking Active   budesonide-formoterol [facility]) 160-4.5 MCG/ACT inhaler [id number] Yes INHALE 2 PUFFS INTO THE LUNGS 2 (TWO) TIMES DAILY. [name], MD Taking Active            Med Note Juanetta Gosling, [facility]   Fri Jul 04, 2020  9:46 AM)    carvedilol (COREG) 25 MG tablet [id number] Yes TAKE 1 TABLET (25 MG TOTAL) BY MOUTH  2 (TWO) TIMES DAILY. [name], MD Taking Active   diclofenac Sodium (VOLTAREN) 1 % GEL [id number] Yes APPLY 2 G TOPICALLY 4 (FOUR) TIMES DAILY. USE ON ELBOW PAIN [name], MD Taking Active         Discontinued 07/11/20 [phone] (Discontinued by provider)   fluticasone Aled[facility] Grana) 50 MCG/ACT nasal spray [id number] Yes Place 1 spray into both nostrils daily as needed for allergies or rhinitis. [name], MD Taking Active Pharmacy Records  furosemide (LASIX) 40 MG tablet [id number] Yes TAKE 1 TABLET (40 MG TOTAL) BY MOUTH DAILY. [name], MD Taking Active    glipiZIDE (GLUCOTROL) 5 MG tablet [id number] Yes TAKE 0.5 TABLETS (2.5 MG TOTAL) BY MOUTH 2 (TWO) TIMES DAILY BEFORE A MEAL. [name], MD Taking Active   isosorbide-hydrALAZINE (BIDIL) 20-37.5 MG tablet [id number] Yes TAKE 1 TABLET BY MOUTH 3 (THREE) TIMES DAILY. [name], MD Taking Active         Discontinued 07/11/20 [phone] (Discontinued by provider)   montelukast (SINGULAIR) 10 MG tablet [id number] Yes TAKE 1 TABLET (10 MG TOTAL) BY MOUTH AT BEDTIME. [name], MD Taking Active   predniSONE (DELTASONE) 20 MG tablet [id number] Yes TAKE 2 TABLETS BY MOUTH DAILY FOR 3 DAYS, THEN 1.5 TABLETS FOR 3 DAYS, THEN 1 TABLET FOR 3 DAYS THEN 1/2 TABLET FOR 3 DAYS [name], MD Taking Active   sodium polystyrene (KAYEXALATE) 15 GM/60ML suspension [id number] Yes TAKE 60 MLS (15 G TOTAL) BY MOUTH ONCE FOR 1 DOSE. [name], MD Taking Active   sodium zirconium cyclosilicate (LOKELMA) 5 g packet [id number] Yes TAKE 5 G BY MOUTH EVERY OTHER DAY FOR 2 DOSES. Marrion Coy, MD Taking Active           Patient Active Problem List   Diagnosis Date Noted  . COPD exacerbation (HCC)   . Typical atrial flutter (HCC)   . Acute respiratory failure with hypoxia (HCC) 07/05/2020  . CHF (congestive heart failure) (HCC) 07/04/2020  . Acute exacerbation of CHF (congestive heart failure) (HCC) 06/16/2020  . COPD with acute exacerbation (HCC) 06/16/2020  . Influenza vaccine refused 05/06/2020  . Acute on chronic combined systolic (congestive) and diastolic (congestive) heart failure (HCC) 05/05/2020  . Acute decompensated heart failure (HCC) 05/04/2020  . Illiteracy 05/04/2020  . Chronic respiratory failure with hypoxia, on home oxygen therapy (HCC) 12/28/2019  . Type 2 diabetes mellitus with stage 3 chronic kidney disease (HCC) 12/25/2019  . Acute and chronic respiratory failure (acute-on-chronic) (HCC) 12/25/2019  . Acute on chronic combined systolic and diastolic CHF (congestive  heart failure) (HCC) 10/26/2019  . Elevated troponin I level 10/26/2019  . Acute on chronic diastolic (congestive) heart failure (HCC) 10/26/2019  . History of gout 02/01/2019  . Seasonal allergic rhinitis due to pollen 02/01/2019  . Tobacco dependence 11/30/2018  . Microscopic hematuria 11/30/2018  . Depression 11/30/2018  . Difficulty controlling anger 11/30/2018  . CAP (community acquired pneumonia) 08/11/2018  . COPD (chronic obstructive pulmonary disease) (HCC)   . CKD (chronic kidney disease) stage 3, GFR 30-59 ml/min (HCC) 08/10/2018  . Recurrent epistaxis 04/21/2018  . Mixed hyperlipidemia 07/28/2017  . Essential hypertension 07/28/2017  . Chronic systolic heart failure (HCC) 10/25/2014  . Cocaine abuse (HCC) 02/20/2013  . Cannabis abuse 02/20/2013  . Back pain, chronic 02/20/2013    Conditions to be addressed/monitored per PCP order:  Depression  Care Plan : General Social Work (Adult)  Updates made by Gustavus Bryant, LCSW  since 10/07/2020 12:00 AM    Problem: Depression Identification (Depression)     Long-Range Goal: Ongoing depression   Start Date: 10/07/2020  Priority: High  Note:   Timeframe:  Long-Range Goal Priority:  High Start Date:    10/07/20                        Expected End Date:  12/07/20                    Follow Up Date 10/13/20   - begin personal counseling - call and visit an old friend - check out volunteer opportunities - join a support group - laugh; watch a funny movie or comedian - learn and use visualization or guided imagery - perform a random act of kindness - practice relaxation or meditation daily - start or continue a personal journal - talk about feelings with a friend, family or spiritual advisor - practice positive thinking and self-talk    Why is this important?    When you are stressed, down or upset, your body reacts too.   For example, your blood pressure may get higher; you may have a headache or stomachache.   When  your emotions get the best of you, your body's ability to fight off cold and flu gets weak.   These steps will help you manage your emotions.   Current Barriers:  . Chronic Mental Health needs related to depression and need for housing . Limited social support, Housing barriers, and Mental Health Concerns  . Social Isolation . ADL IADL limitations . Suicidal Ideation/Homicidal Ideation: No  Clinical Social Work Goal(s):  [name] Over the next 120 days, patient will work with SW monthly by telephone or in person to reduce or manage symptoms related to depression . patient will work with [name] to address needs related to finding stable housing but patient was made aware that housing resources are very limited at this time  Interventions: . Patient interviewed and appropriate assessments performed: brief mental health assessment . PHQ 2 . PHQ 9 SDOH Interventions    Flowsheet Row Most Recent Value  SDOH Interventions   SDOH Interventions for the Following Domains Housing  Housing Interventions Other (Comment)  [Referral for housing support]  Depression Interventions/Treatment  Patient refuses Treatment    .   [name] Patient interviewed and appropriate assessments performed . Discussed plans with patient for ongoing care management follow up and provided patient with direct contact information for care management team . Assisted patient/caregiver with obtaining information about health plan benefits . Encouraged patient to consider a mental health provider for long term follow up and therapy/counseling but patient declined. He reports that his main source of depression is from not being able to find a place on his own. He is currently stable and residing with his brother and his brother's family in their home. Patient reports that his brother leaves for work at 3 pm during the week and gets back home around 1 am. [employer] number provided to patient.  [name] [facility] LCSW completed care  coordination with [facility] [name] on 10/07/20 who will follow up with patient by the end of this week. . Patient reports recent agitation due to his inability to secure housing on this own. [facility] LCSW provided emotional support and brief housing resource education. Patient is currently not on any public housing wait list.   . Solution-Focused Strategies, Mindfulness or Relaxation Training, Active listening / Reflection utilized ,  Emotional Supportive Provided, and Verbalization of feelings encouraged  . Patient decline any current substance use   Patient Self Care Activities:  . Ability for insight . Independent living . Strong family or social support  Patient Coping Strengths:  . Journalist, newspaper . Able to Communicate Effectively  Patient Self Care Deficits:  . Lacks social connections  Initial goal documentation  Depression screen [facility] 2/9 10/07/2020 11/01/2019 09/11/2019 07/09/2019 04/04/2019  Decreased Interest 2 2 0 1 3  Down, Depressed, Hopeless 2 3 1 1 3   PHQ - 2 Score 4 5 1 2 6   Altered sleeping 1 3 3  0 3  Tired, decreased energy 3 2 0 1 3  Change in appetite 0 - 0 1 0  Feeling bad or failure about yourself  2 2 0 0 3  Trouble concentrating 2 2 0 0 3  Moving slowly or fidgety/restless 0 - 0 0 3  Suicidal thoughts 0 0 0 0 0  PHQ-9 Score 12 14 4 4 21   Difficult doing work/chores Somewhat difficult - - - Extremely dIfficult     Task: Identify Depressive Symptoms and Facilitate Treatment   Note:   Care Management Activities:    - depression screen reviewed - participation in psychiatric services encouraged    Notes:      Follow up:  Patient agrees to Care Plan and Follow-up.  Plan: The Managed Medicaid care management team will reach out to the patient again over the next 7 days.  Date/time of next scheduled Social Work care management/care coordination outreach: 10/13/20  Dickie La, [name], MSW, LCSW Managed Medicaid LCSW [facility]  [facility] [email or web address] Phone: [phone]

## 2020-10-08 ENCOUNTER — Other Ambulatory Visit: Payer: Self-pay

## 2020-10-08 NOTE — Telephone Encounter (Signed)
Patient came in today and picked up his medications. Patient states the gout medication was not sent. Patient recalled the name of the medicine was cortisone. I did not see anything in his chart under that name for gout. Patient requests call back from nurse. Please advise.

## 2020-10-09 ENCOUNTER — Other Ambulatory Visit: Payer: Self-pay

## 2020-10-09 ENCOUNTER — Other Ambulatory Visit: Payer: Self-pay | Admitting: Nephrology

## 2020-10-09 DIAGNOSIS — I5022 Chronic systolic (congestive) heart failure: Secondary | ICD-10-CM | POA: Diagnosis not present

## 2020-10-09 DIAGNOSIS — N1832 Chronic kidney disease, stage 3b: Secondary | ICD-10-CM

## 2020-10-09 DIAGNOSIS — E1122 Type 2 diabetes mellitus with diabetic chronic kidney disease: Secondary | ICD-10-CM | POA: Diagnosis not present

## 2020-10-09 DIAGNOSIS — R809 Proteinuria, unspecified: Secondary | ICD-10-CM | POA: Diagnosis not present

## 2020-10-09 DIAGNOSIS — I10 Essential (primary) hypertension: Secondary | ICD-10-CM | POA: Diagnosis not present

## 2020-10-09 MED ORDER — FARXIGA 10 MG PO TABS
ORAL_TABLET | ORAL | 11 refills | Status: DC
  Filled 2020-10-09: qty 30, 30d supply, fill #0

## 2020-10-09 NOTE — Telephone Encounter (Signed)
Pt is requesting Colchicine for gout. Pt is wanting rx sent to [facility]

## 2020-10-10 ENCOUNTER — Other Ambulatory Visit: Payer: Self-pay

## 2020-10-10 MED ORDER — COLCHICINE 0.6 MG PO TABS
ORAL_TABLET | ORAL | 1 refills | Status: DC
  Filled 2020-10-10 – 2020-10-13 (×2): qty 30, 15d supply, fill #0

## 2020-10-10 NOTE — Telephone Encounter (Signed)
Contacted pt and made aware rx has been sent

## 2020-10-10 NOTE — Telephone Encounter (Signed)
Done

## 2020-10-13 ENCOUNTER — Other Ambulatory Visit: Payer: Self-pay | Admitting: Licensed Clinical Social Worker

## 2020-10-13 ENCOUNTER — Other Ambulatory Visit: Payer: Self-pay

## 2020-10-13 NOTE — Patient Outreach (Signed)
Medicaid Managed Care Social Work Note  10/13/2020 Name:  Chris [name] MRN:  [id number] DOB:  14-Feb-1961  Chris [name] is an 60 y.o. year old male who is a primary patient of Chris Matar, MD.  The Medicaid Managed Care Coordination team was consulted for assistance with:  Mental Health Counseling and Resources  [name] was given information about Medicaid Managed CareCoordination services today. Chris [name] agreed to services and verbal consent obtained.  Engaged with patient  for by telephone forfollow up visit in response to referral for case management and/or care coordination services.   Assessments/Interventions:  Review of past medical history, allergies, medications, health status, including review of consultants reports, laboratory and other test data, was performed as part of comprehensive evaluation and provision of chronic care management services.  SDOH: (Social Determinant of Health) assessments and interventions performed: SDOH Interventions   Flowsheet Row Most Recent Value  SDOH Interventions   SDOH Interventions for the Following Domains Depression, Stress  Housing Interventions Other (Comment)  [[name] is assisting with housing]  Stress Interventions Offered [employer], Provide Counseling  Depression Interventions/Treatment  Patient refuses Treatment      Advanced Directives Status:  Not addressed in this encounter.  Care Plan                 No Known Allergies  Medications Reviewed Today    Reviewed by Chris Dach, RN (Registered Nurse) on 10/01/20 at 1608  Med List Status: <None>  Medication Order Taking? Sig Documenting Provider Last Dose Status Informant  Accu-Chek Softclix Lancets lancets [id number] Yes USE AS INSTRUCTED. INJECT INTO THE SKIN ONCE DAILY  Patient taking differently: USE AS INSTRUCTED. INJECT INTO THE SKIN ONCE DAILY   Chris Rigg, NP Taking Active   albuterol (PROVENTIL) (2.5 MG/3ML) 0.083% nebulizer  solution [id number] Yes TAKE 3 MLS BY NEBULIZATION EVERY 6 (SIX) HOURS AS NEEDED FOR SHORTNESS OF BREATH. Chris Matar, MD Taking Active            Med Note [facility], Chris [name]   Fri Jul 11, 2020  9:19 AM)    albuterol (VENTOLIN HFA) 108 (90 Base) MCG/ACT inhaler [id number] Yes INHALE 2 PUFFS INTO THE LUNGS EVERY 6 (SIX) HOURS AS NEEDED FOR WHEEZING OR SHORTNESS OF BREATH. Chris Matar, MD Taking Active            Med Note Chris [name], [facility]   Fri Jul 04, 2020  9:48 AM)    allopurinol (ZYLOPRIM) 100 MG tablet [id number] Yes TAKE 2 TABLETS (200 MG TOTAL) BY MOUTH DAILY. Chris Matar, MD Taking Active   apixaban (ELIQUIS) 5 MG TABS tablet [id number] Yes TAKE 1 TABLET (5 MG TOTAL) BY MOUTH 2 (TWO) TIMES DAILY. Chris Matar, MD Taking Active   aspirin 81 MG EC tablet [id number] Yes TAKE 1 TABLET (81 MG TOTAL) BY MOUTH DAILY. Chris Matar, MD Taking Active   atorvastatin (LIPITOR) 40 MG tablet [id number] Yes TAKE 1 TABLET (40 MG TOTAL) BY MOUTH DAILY AT 6 PM.  Patient taking differently: Take by mouth daily. at 6pm   Chris Matar, MD Taking Active   budesonide-formoterol [facility]) 160-4.5 MCG/ACT inhaler [id number] Yes INHALE 2 PUFFS INTO THE LUNGS 2 (TWO) TIMES DAILY. Chris Matar, MD Taking Active            Med Note Chris [name], [facility]   Fri Jul 04, 2020  9:46 AM)    carvedilol (COREG) 25 MG tablet [id number]  Yes TAKE 1 TABLET (25 MG TOTAL) BY MOUTH 2 (TWO) TIMES DAILY. Chris Odea, MD Taking Active   diclofenac Sodium (VOLTAREN) 1 % GEL [id number] Yes APPLY 2 G TOPICALLY 4 (FOUR) TIMES DAILY. USE ON ELBOW PAIN Chris Barrio, MD Taking Active         Discontinued 07/11/20 [id number] (Discontinued by provider)   fluticasone Aleda Grana) 50 MCG/ACT nasal spray [id number] Yes Place 1 spray into both nostrils daily as needed for allergies or rhinitis. Chris Magnus, MD Taking Active Pharmacy Records  furosemide (LASIX) 40 MG tablet [id number] Yes TAKE 1 TABLET  (40 MG TOTAL) BY MOUTH DAILY. Chris Matar, MD Taking Active   glipiZIDE (GLUCOTROL) 5 MG tablet [id number] Yes TAKE 0.5 TABLETS (2.5 MG TOTAL) BY MOUTH 2 (TWO) TIMES DAILY BEFORE A MEAL. Chris Matar, MD Taking Active   isosorbide-hydrALAZINE (BIDIL) 20-37.5 MG tablet [id number] Yes TAKE 1 TABLET BY MOUTH 3 (THREE) TIMES DAILY. Chris Matar, MD Taking Active         Discontinued 07/11/20 [phone] (Discontinued by provider)   montelukast (SINGULAIR) 10 MG tablet [id number] Yes TAKE 1 TABLET (10 MG TOTAL) BY MOUTH AT BEDTIME. Chris Matar, MD Taking Active   predniSONE (DELTASONE) 20 MG tablet [id number] Yes TAKE 2 TABLETS BY MOUTH DAILY FOR 3 DAYS, THEN 1.5 TABLETS FOR 3 DAYS, THEN 1 TABLET FOR 3 DAYS THEN 1/2 TABLET FOR 3 DAYS Chris Matar, MD Taking Active   sodium polystyrene (KAYEXALATE) 15 GM/60ML suspension [id number] Yes TAKE 60 MLS (15 G TOTAL) BY MOUTH ONCE FOR 1 DOSE. Chris Matar, MD Taking Active   sodium zirconium cyclosilicate (LOKELMA) 5 g packet [id number] Yes TAKE 5 G BY MOUTH EVERY OTHER DAY FOR 2 DOSES. Chris Coy, MD Taking Active           Patient Active Problem List   Diagnosis Date Noted  . COPD exacerbation (HCC)   . Typical atrial flutter (HCC)   . Acute respiratory failure with hypoxia (HCC) 07/05/2020  . CHF (congestive heart failure) (HCC) 07/04/2020  . Acute exacerbation of CHF (congestive heart failure) (HCC) 06/16/2020  . COPD with acute exacerbation (HCC) 06/16/2020  . Influenza vaccine refused 05/06/2020  . Acute on chronic combined systolic (congestive) and diastolic (congestive) heart failure (HCC) 05/05/2020  . Acute decompensated heart failure (HCC) 05/04/2020  . Illiteracy 05/04/2020  . Chronic respiratory failure with hypoxia, on home oxygen therapy (HCC) 12/28/2019  . Type 2 diabetes mellitus with stage 3 chronic kidney disease (HCC) 12/25/2019  . Acute and chronic respiratory failure (acute-on-chronic) (HCC) 12/25/2019   . Acute on chronic combined systolic and diastolic CHF (congestive heart failure) (HCC) 10/26/2019  . Elevated troponin I level 10/26/2019  . Acute on chronic diastolic (congestive) heart failure (HCC) 10/26/2019  . History of gout 02/01/2019  . Seasonal allergic rhinitis due to pollen 02/01/2019  . Tobacco dependence 11/30/2018  . Microscopic hematuria 11/30/2018  . Depression 11/30/2018  . Difficulty controlling anger 11/30/2018  . CAP (community acquired pneumonia) 08/11/2018  . COPD (chronic obstructive pulmonary disease) (HCC)   . CKD (chronic kidney disease) stage 3, GFR 30-59 ml/min (HCC) 08/10/2018  . Recurrent epistaxis 04/21/2018  . Mixed hyperlipidemia 07/28/2017  . Essential hypertension 07/28/2017  . Chronic systolic heart failure (HCC) 10/25/2014  . Cocaine abuse (HCC) 02/20/2013  . Cannabis abuse 02/20/2013  . Back pain, chronic 02/20/2013    Conditions to be addressed/monitored per PCP order:  Anxiety and Depression  Care Plan : General  Social Work (Adult)  Updates made by Gustavus Bryant, LCSW since 10/13/2020 12:00 AM    Problem: Depression Identification (Depression)     Long-Range Goal: Ongoing depression   Start Date: 10/07/2020  Priority: High  Note:   Timeframe:  Long-Range Goal Priority:  High Start Date:    10/07/20                        Expected End Date:  12/07/20                    Follow Up Date 10/17/20   - begin personal counseling - call and visit an old friend - check out volunteer opportunities - join a support group - laugh; watch a funny movie or comedian - learn and use visualization or guided imagery - perform a random act of kindness - practice relaxation or meditation daily - start or continue a personal journal - talk about feelings with a friend, family or spiritual advisor - practice positive thinking and self-talk    Why is this important?    When you are stressed, down or upset, your body reacts too.   For example, your  blood pressure may get higher; you may have a headache or stomachache.   When your emotions get the best of you, your body's ability to fight off cold and flu gets weak.   These steps will help you manage your emotions.   Current Barriers:  . Chronic Mental Health needs related to depression and need for housing . Limited social support, Housing barriers, and Mental Health Concerns  . Social Isolation . ADL IADL limitations . Suicidal Ideation/Homicidal Ideation: No  Clinical Social Work Goal(s):  [name] Over the next 120 days, patient will work with SW monthly by telephone or in person to reduce or manage symptoms related to depression . patient will work with [name] to address needs related to finding stable housing but patient was made aware that housing resources are very limited at this time  Interventions: . Patient interviewed and appropriate assessments performed: brief mental health assessment . PHQ 2 . PHQ 9 SDOH Interventions    Flowsheet Row Most Recent Value  SDOH Interventions   SDOH Interventions for the Following Domains Housing  Housing Interventions Other (Comment)  [Referral for housing support]  Depression Interventions/Treatment  Patient refuses Treatment    .   [name] Patient interviewed and appropriate assessments performed . Discussed plans with patient for ongoing care management follow up and provided patient with direct contact information for care management team . Assisted patient/caregiver with obtaining information about health plan benefits . Encouraged patient to consider a mental health provider for long term follow up and therapy/counseling but patient declined. Patient reports that does not need a psychiatry referral at this time. He reports that his main source of depression is from not being able to find a place on his own. He is currently stable and residing with his brother and his brother's family in their home. Patient reports that his brother leaves for  work at 3 pm during the week and gets back home around 1 am. [employer] number provided to patient. Patient reports that his sister has found him a house to relocate in. This house is inherited from the family and will need some additional work done. Patient reports that this house has mold in it but he rather reside there instead of living with his brother. Patient is currently  stable and reports no urgent concerns at this time. [name] [facility] LCSW completed care coordination with [facility] [name] on 10/07/20 who will follow up with patient this week. . Patient reports recent agitation due to his inability to secure housing on this own. [facility] LCSW provided emotional support and brief housing resource education. Patient is currently not on any public housing wait list.   . Solution-Focused Strategies, Mindfulness or Relaxation Training, Active listening / Reflection utilized , Emotional Supportive Provided, and Verbalization of feelings encouraged  . Patient decline any current substance use. He reports that his last use of cocaine was over 6 months ago. . Patient reports that he has developed social anxiety. He admits that he gets irritable at times and will snap at his loved ones and then will experience guilt afterwards. Educated patient on coping methods to implement into his daily life to combat anxiety symptoms and stress. Patient denied any current suicidal or homicidal ideations. Encouraged patient to implement deep breathing and grounding exercises into his daily routine due to ongoing anxiety and SOB.  Patient Self Care Activities:  . Ability for insight . Independent living . Strong family or social support  Patient Coping Strengths:  . Journalist, newspaper . Able to Communicate Effectively  Patient Self Care Deficits:  . Lacks social connections  Depression screen [facility] 2/9 10/07/2020 11/01/2019 09/11/2019 07/09/2019 04/04/2019  Decreased Interest 2 2 0 1 3  Down, Depressed, Hopeless 2 3 1 1 3   PHQ  - 2 Score 4 5 1 2 6   Altered sleeping 1 3 3  0 3  Tired, decreased energy 3 2 0 1 3  Change in appetite 0 - 0 1 0  Feeling bad or failure about yourself  2 2 0 0 3  Trouble concentrating 2 2 0 0 3  Moving slowly or fidgety/restless 0 - 0 0 3  Suicidal thoughts 0 0 0 0 0  PHQ-9 Score 12 14 4 4 21   Difficult doing work/chores Somewhat difficult - - - Extremely dIfficult       Follow up:  Patient agrees to Care Plan and Follow-up.  Plan: The Managed Medicaid care management team will reach out to the patient again over the next 7 days.  Date/time of next scheduled Social Work care management/care coordination outreach:  10/17/20 at 10:30   Dickie La, [name], MSW, LCSW Managed Medicaid LCSW [facility]  [facility] [email or web address] Phone: [phone]

## 2020-10-13 NOTE — Patient Instructions (Signed)
Visit Information  Mr. Goode was given information about Medicaid Managed Care team care coordination services as a part of their Healthy [facility] Medicaid benefit. REIK INGERSON verbally consented to engagement with the [facility] Managed Care team.   For questions related to your Healthy [facility] health plan, please call: [phone] or visit the homepage here: [email or web address]  If you would like to schedule transportation through your Healthy [facility] plan, please call the following number at least 2 days in advance of your appointment: [phone]   Call the [facility] at [phone], at any time, 24 hours a day, 7 days a week. If you are in danger or need immediate medical attention call 911.  Mr. Hildebrandt - following are the goals we discussed in your visit today:  Goals Addressed            This Visit's Progress   . Manage My Emotions       Timeframe:  Long-Range Goal Priority:  High Start Date:    10/07/20                        Expected End Date:  12/07/20                    Follow Up Date 10/17/20   - begin personal counseling - call and visit an old friend - check out volunteer opportunities - join a support group - laugh; watch a funny movie or comedian - learn and use visualization or guided imagery - perform a random act of kindness - practice relaxation or meditation daily - start or continue a personal journal - talk about feelings with a friend, family or spiritual advisor - practice positive thinking and self-talk    Why is this important?    When you are stressed, down or upset, your body reacts too.   For example, your blood pressure may get higher; you may have a headache or stomachache.   When your emotions get the best of you, your body's ability to fight off cold and flu gets weak.   These steps will help you manage your emotions.   Current Barriers:  . Chronic Mental Health needs  related to depression and need for housing . Limited social support, Housing barriers, and Mental Health Concerns  . Social Isolation . ADL IADL limitations . Suicidal Ideation/Homicidal Ideation: No  Clinical Social Work Goal(s):  [name] Over the next 120 days, patient will work with SW monthly by telephone or in person to reduce or manage symptoms related to depression . patient will work with [name] to address needs related to finding stable housing but patient was made aware that housing resources are very limited at this time  Interventions: . Patient interviewed and appropriate assessments performed: brief mental health assessment . PHQ 2 . PHQ 9 SDOH Interventions    Flowsheet Row Most Recent Value  SDOH Interventions   SDOH Interventions for the Following Domains Housing  Housing Interventions Other (Comment)  [Referral for housing support]  Depression Interventions/Treatment  Patient refuses Treatment    .   [name] Patient interviewed and appropriate assessments performed . Discussed plans with patient for ongoing care management follow up and provided patient with direct contact information for care management team . Assisted patient/caregiver with obtaining information about health plan benefits . Encouraged patient to consider a mental health provider for long term follow up and therapy/counseling but patient declined. Patient reports that does not  need a psychiatry referral at this time. He reports that his main source of depression is from not being able to find a place on his own. He is currently stable and residing with his brother and his brother's family in their home. Patient reports that his brother leaves for work at 3 pm during the week and gets back home around 1 am. [employer] number provided to patient. Patient reports that his sister has found him a house to relocate in. This house is inherited from the family and will need some additional work done. Patient  reports that this house has mold in it but he rather reside there instead of living with his brother. Patient is currently stable and reports no urgent concerns at this time. [name] [facility] LCSW completed care coordination with [facility] [name] on 10/07/20 who will follow up with patient this week. . Patient reports recent agitation due to his inability to secure housing on this own. [facility] LCSW provided emotional support and brief housing resource education. Patient is currently not on any public housing wait list.   . Solution-Focused Strategies, Mindfulness or Relaxation Training, Active listening / Reflection utilized , Emotional Supportive Provided, and Verbalization of feelings encouraged  . Patient decline any current substance use. He reports that his last use of cocaine was over 6 months ago. . Patient reports that he has developed social anxiety. He admits that he gets irritable at times and will snap at his loved ones and then will experience guilt afterwards. Educated patient on coping methods to implement into his daily life to combat anxiety symptoms and stress. Patient denied any current suicidal or homicidal ideations. Encouraged patient to implement deep breathing and grounding exercises into his daily routine due to ongoing anxiety and SOB.  Patient Self Care Activities:  . Ability for insight . Independent living . Strong family or social support  Patient Coping Strengths:  . Conservator, museum/gallery . Able to Communicate Effectively  Patient Self Care Deficits:  . Lacks social connections  Depression screen [facility] 2/9 10/07/2020 11/01/2019 09/11/2019 07/09/2019 04/04/2019  Decreased Interest 2 2 0 1 3  Down, Depressed, Hopeless 2 3 1 1 3   PHQ - 2 Score 4 5 1 2 6   Altered sleeping 1 3 3  0 3  Tired, decreased energy 3 2 0 1 3  Change in appetite 0 - 0 1 0  Feeling bad or failure about yourself  2 2 0 0 3  Trouble concentrating 2 2 0 0 3  Moving slowly or fidgety/restless 0 - 0 0 3  Suicidal thoughts 0 0 0 0 0   PHQ-9 Score 12 14 4 4 21   Difficult doing work/chores Somewhat difficult - - - Extremely dIfficult         The Managed Medicaid care management team will reach out to the patient again over the next 7 days.   Eula Fried, [name], MSW, [employer] Managed Medicaid [email or web address] [email or web address] Phone: [phone]     Following is a copy of your plan of care:  Patient Care Plan: Heart Failure (Adult)    Problem Identified: Symptom Exacerbation (Heart Failure)     Long-Range Goal: Symptom Exacerbation Prevented or Minimized   Start Date: 07/02/2020  Expected End Date: 12/01/2020  Recent Progress: On track  Priority: High  Note:   Current Barriers:  . Chronic Disease Management, support and educational needs related to management of multiple chronic diagnosis. Mr. Wesely is managing CHF, DMII, stage III  CKD and chronic respiratory failure. He missed his last appointment with his PCP and cardiologist. He reports eating right, checking his blood sugar daily and taking all of his medications. He recently moved and is living with his brother, desires his own place.  Nurse Case Manager Clinical Goal(s):  . patient will work with Jackelyn Poling [facility] to address needs related to housing . patient will meet with RN Care Manager to address needs related to managing CHF and DMII . patient will demonstrate improved health management independence as evidenced bytaking prescribed medications, reporting any changes in symptoms to PCP such as swelling, changes in breathing, new cough . Patient will work with [facility] pharmacist, Ovid Curd for medication management  Interventions:  . Inter-disciplinary care team collaboration (see longitudinal plan of care) . Evaluation of current treatment plan related to CHF and DMII and patient's adherence to plan as established by provider. . Advised patient to follow up with PCP and assisted with procuring  follow up appointment for 11/12/20 @ 11:10 . Encouraged patient to schedule follow up cardiology appointment . Reviewed medications with patient  . Collaborated with [name] regarding housing need . Discussed plans with patient for ongoing care management follow up and provided patient with direct contact information for care management team . Advised patient, providing education and rationale, to weigh daily and record, calling PCP for weight gain of 3lbs overnight or 5 pounds in a week.  [name] Pharmacy referral for medication review  Patient Goals/Self-Care Activities Over the next 30 days, patient will: -Call to fill prescriptions one week before I run out of medication -Take all medications as prescribed - call office if I gain more than 2 pounds in one day or 5 pounds in one week - use salt in moderation - watch for swelling in feet, ankles and legs every day  - eat more whole grains, fruits and vegetables, lean meats and healthy fats - know when to call the doctor - track symptoms and what helps feel better or worse - dress right for the weather, hot or cold   Follow Up Plan: Telephone follow up appointment with Managed Medicaid care management team member scheduled for:10/29/20 @ 9am      Patient Care Plan: General Social Work (Adult)    Problem Identified: Homelessness   Onset Date: 07/09/2020  Note:   Chris [name] is a 60 y.o. year old male who sees [name], MD for primary care. The  Medicaid Managed Care team was consulted for assistance with Housing barriers. Mr. Nearhood was given information about Care Management services, agreed to services, and verbal consent for services was obtained.  Interventions:  . Patient interviewed and appropriate assessments performed . Collaborated with clinical team regarding patient needs  . SDOH (Social Determinants of Health) assessments performed: Yes .     [name] Provided patient with information about the [employer]. [name] contacted [facility] to complete a referral for patient to get into the homeless motel. [name] left a voicemail. [name] informed patient that the process can take a long time. Patient states he does not know why no one wants to help him. [name] informed patient he may be able to go to a shelter in [facility], patient states he does not feel safe in [place].  [name] contacted [employer] in [place] [phone] and left a voicemail for the intake coordinator. . [name] contacted [place] to speak with a Education officer, museum but kept getting transferred.  . Patient states he does  not have any money to get a room and become frustrated again stating that we "[place]"do not want to help him. [name] informed patient that she will wait for the [name] and/or the [facility] to contact her back. [name] informed patient she could provide him with the information for [facility] but patient did not want to call. [name] Update 07/22/20: Patient stated he is doing okay since his surgery but still wants somewhere else to live. Patient stated he knows of a boarding house on [address] and would like SW to help him get in it. SW researched and could not find a boarding house on [address], Patient stated he would get the name of it and contact [name] with the information.  Plan:  . Over the next 30-60 days, patient will work with [name] to address needs related to Housing barriers . Social Worker will continue to follow up with patient.   [name], [name], [place]  High Risk Managed Medicaid Team         Problem Identified: Depression Identification (Depression)     Long-Range Goal: Ongoing depression   Start Date: 10/07/2020  Priority: High  Note:   Timeframe:  Long-Range Goal Priority:  High Start Date:    10/07/20                        Expected End Date:  12/07/20                    Follow Up Date 10/17/20   - begin personal counseling - call and visit an old friend -  check out volunteer opportunities - join a support group - laugh; watch a funny movie or comedian - learn and use visualization or guided imagery - perform a random act of kindness - practice relaxation or meditation daily - start or continue a personal journal - talk about feelings with a friend, family or spiritual advisor - practice positive thinking and self-talk    Why is this important?    When you are stressed, down or upset, your body reacts too.   For example, your blood pressure may get higher; you may have a headache or stomachache.   When your emotions get the best of you, your body's ability to fight off cold and flu gets weak.   These steps will help you manage your emotions.   Current Barriers:  . Chronic Mental Health needs related to depression and need for housing . Limited social support, Housing barriers, and Mental Health Concerns  . Social Isolation . ADL IADL limitations . Suicidal Ideation/Homicidal Ideation: No  Clinical Social Work Goal(s):  [name] Over the next 120 days, patient will work with SW monthly by telephone or in person to reduce or manage symptoms related to depression . patient will work with [name] to address needs related to finding stable housing but patient was made aware that housing resources are very limited at this time  Interventions: . Patient interviewed and appropriate assessments performed: brief mental health assessment . PHQ 2 . PHQ 9 SDOH Interventions    Flowsheet Row Most Recent Value  SDOH Interventions   SDOH Interventions for the Following Domains Housing  Housing Interventions Other (Comment)  [Referral for housing support]  Depression Interventions/Treatment  Patient refuses Treatment    .   [name] Patient interviewed and appropriate assessments performed . Discussed plans with patient for ongoing care management follow up and  provided patient with direct contact information for care management team . Assisted  patient/caregiver with obtaining information about health plan benefits . Encouraged patient to consider a mental health provider for long term follow up and therapy/counseling but patient declined. Patient reports that does not need a psychiatry referral at this time. He reports that his main source of depression is from not being able to find a place on his own. He is currently stable and residing with his brother and his brother's family in their home. Patient reports that his brother leaves for work at 3 pm during the week and gets back home around 1 am. [employer] number provided to patient. Patient reports that his sister has found him a house to relocate in. This house is inherited from the family and will need some additional work done. Patient reports that this house has mold in it but he rather reside there instead of living with his brother. Patient is currently stable and reports no urgent concerns at this time. [name] [facility] LCSW completed care coordination with [facility] [name] on 10/07/20 who will follow up with patient this week. . Patient reports recent agitation due to his inability to secure housing on this own. [facility] LCSW provided emotional support and brief housing resource education. Patient is currently not on any public housing wait list.   . Solution-Focused Strategies, Mindfulness or Relaxation Training, Active listening / Reflection utilized , Emotional Supportive Provided, and Verbalization of feelings encouraged  . Patient decline any current substance use. He reports that his last use of cocaine was over 6 months ago. . Patient reports that he has developed social anxiety. He admits that he gets irritable at times and will snap at his loved ones and then will experience guilt afterwards. Educated patient on coping methods to implement into his daily life to combat anxiety symptoms and stress. Patient denied any current suicidal or homicidal ideations. Encouraged patient to implement  deep breathing and grounding exercises into his daily routine due to ongoing anxiety and SOB.  Patient Self Care Activities:  . Ability for insight . Independent living . Strong family or social support  Patient Coping Strengths:  . Conservator, museum/gallery . Able to Communicate Effectively  Patient Self Care Deficits:  . Lacks social connections  Depression screen [facility] 2/9 10/07/2020 11/01/2019 09/11/2019 07/09/2019 04/04/2019  Decreased Interest 2 2 0 1 3  Down, Depressed, Hopeless 2 3 1 1 3   PHQ - 2 Score 4 5 1 2 6   Altered sleeping 1 3 3  0 3  Tired, decreased energy 3 2 0 1 3  Change in appetite 0 - 0 1 0  Feeling bad or failure about yourself  2 2 0 0 3  Trouble concentrating 2 2 0 0 3  Moving slowly or fidgety/restless 0 - 0 0 3  Suicidal thoughts 0 0 0 0 0  PHQ-9 Score 12 14 4 4 21   Difficult doing work/chores Somewhat difficult - - - Extremely dIfficult     Task: Identify Depressive Symptoms and Facilitate Treatment   Note:   Care Management Activities:    - depression screen reviewed - participation in psychiatric services encouraged    Notes:

## 2020-10-13 NOTE — Patient Outreach (Signed)
Medicaid Managed Care    Pharmacy Note  10/13/2020 Name: Chris [name] MRN: [id number] DOB: 1960/08/19  Chris [name] is a 60 y.o. year old male who is a primary care patient of Chris Pier, MD. The [facility] Managed Care Coordination team was consulted for assistance with disease management and care coordination needs.    Engaged with patient Engaged with patient by telephone for follow up visit in response to referral for case management and/or care coordination services.  Chris [name] was given information about Managed Medicaid Care Coordination team services [name]. Chris [name] agreed to services and verbal consent obtained.   Objective:  Lab Results  Component Value Date   CREATININE 2.14 (H) 07/09/2020   CREATININE 1.83 (H) 07/08/2020   CREATININE 2.00 (H) 07/07/2020    Lab Results  Component Value Date   HGBA1C 6.4 (H) 07/05/2020       Component Value Date/Time   CHOL 176 12/25/2019 0632   CHOL 160 11/07/2019 0918   CHOL 157 10/21/2013 0435   TRIG 91 12/25/2019 0632   TRIG 117 10/21/2013 0435   HDL 37 (L) 12/25/2019 0632   HDL 40 11/07/2019 0918   HDL 30 (L) 10/21/2013 0435   CHOLHDL 4.8 12/25/2019 0632   VLDL 18 12/25/2019 0632   VLDL 23 10/21/2013 0435   LDLCALC 121 (H) 12/25/2019 0632   LDLCALC 92 11/07/2019 0918   LDLCALC 104 (H) 10/21/2013 0435    Other: (TSH, CBC, Vit D, etc.)  Clinical ASCVD: Yes  The 10-year ASCVD risk score Chris [name]., et al., 2013) is: 52.8%   Values used to calculate the score:     Age: 55 years     Sex: Male     Is Non-Hispanic African American: Yes     Diabetic: Yes     Tobacco smoker: Yes     Systolic Blood Pressure: 073 mmHg     Is BP treated: Yes     HDL Cholesterol: 37 mg/dL     Total Cholesterol: 176 mg/dL    Other: (CHADS2VASc if Afib, PHQ9 if depression, MMRC or CAT for COPD, ACT, DEXA)  BP Readings from Last 3 Encounters:  07/17/20 126/90  07/11/20 130/80  07/10/20 (!) 160/100     Assessment/Interventions: Review of patient past medical history, allergies, medications, health status, including review of consultants reports, laboratory and other test data, was performed as part of comprehensive evaluation and provision of chronic care management services.   Cardio Apixaban 5mg  ASA 81mg  Carvedilol 25mg  BID Bidil Plan: At goal,  patient stable/ symptoms controlled   Lipids Atorvastatin 40mg  Plan: At goal,  patient stable/ symptoms controlled   Gout Allopurinol Co[place]icine Plan: At goal,  patient stable/ symptoms controlled   DM Lab Results  Component Value Date   HGBA1C 6.4 (H) 07/05/2020   HGBA1C 6.2 (H) 05/04/2020   HGBA1C 6.8 (H) 12/25/2019   Lab Results  Component Value Date   LDLCALC 121 (H) 12/25/2019   CREATININE 2.14 (H) 07/09/2020    Lab Results  Component Value Date   NA 135 07/09/2020   K 5.4 (H) 07/09/2020   CREATININE 2.14 (H) 07/09/2020   GFRNONAA 35 (L) 07/09/2020   GFRAA 42 (L) 01/14/2020   GLUCOSE 113 (H) 07/09/2020   Dapagliflozin Glipizide 5mg  Plan: At goal,  patient stable/ symptoms controlled  Meds -Patient really wants to get own housing, this is his top priority. 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 Sig Documenting Provider Last Dose Status Informant  Accu-Chek Softclix Lancets lancets [id number] Yes USE AS INSTRUCTED. INJECT INTO THE SKIN ONCE DAILY  Patient taking differently: USE AS INSTRUCTED. INJECT INTO THE SKIN ONCE DAILY   Chris Pounds, NP Taking Active   albuterol  (PROVENTIL) (2.5 MG/3ML) 0.083% nebulizer solution [id number] Yes TAKE 3 MLS BY NEBULIZATION EVERY 6 (SIX) HOURS AS NEEDED FOR SHORTNESS OF BREATH. Chris Pier, MD Taking Active            Med Note [facility], [place]   Fri Jul 11, 2020  9:19 AM)    albuterol (VENTOLIN HFA) 108 (90 Base) MCG/ACT inhaler [id number] Yes INHALE 2 PUFFS INTO THE LUNGS EVERY 6 (SIX) HOURS AS NEEDED FOR WHEEZING OR SHORTNESS OF BREATH. Chris Pier, MD Taking Active            Med Note [name], [facility]   Fri Jul 04, 2020  9:48 AM)    allopurinol (ZYLOPRIM) 100 MG tablet [id number] Yes TAKE 2 TABLETS (200 MG TOTAL) BY MOUTH DAILY. Chris Pier, MD Taking Active   apixaban (ELIQUIS) 5 MG TABS tablet [id number] Yes TAKE 1 TABLET (5 MG TOTAL) BY MOUTH 2 (TWO) TIMES DAILY. Chris Pier, MD Taking Active   aspirin 81 MG EC tablet [id number] Yes TAKE 1 TABLET (81 MG TOTAL) BY MOUTH DAILY. Chris Pier, MD Taking Active   atorvastatin (LIPITOR) 40 MG tablet [id number] Yes TAKE 1 TABLET (40 MG TOTAL) BY MOUTH DAILY AT 6 PM.  Patient taking differently: Take by mouth daily. at 6pm   Chris Pier, MD Taking Active   budesonide-formoterol [facility]) 160-4.5 MCG/ACT inhaler [id number] Yes INHALE 2 PUFFS INTO THE LUNGS 2 (TWO) TIMES DAILY. Chris Pier, MD Taking Active            Med Note [name], [facility]   Fri Jul 04, 2020  9:46 AM)    carvedilol (COREG) 25 MG tablet [id number] Yes TAKE 1 TABLET (25 MG TOTAL) BY MOUTH 2 (TWO) TIMES DAILY. [name], MD Taking Active   diclofenac Sodium (VOLTAREN) 1 % GEL [id number] Yes APPLY 2 G TOPICALLY 4 (FOUR) TIMES DAILY. USE ON ELBOW PAIN [name], MD Taking Active         Discontinued 07/11/20 [phone] (Discontinued by provider)   fluticasone Asencion Islam) 50 MCG/ACT nasal spray [id number] Yes Place 1 spray into both nostrils daily as needed for allergies or rhinitis. [name], MD Taking Active Pharmacy Records  furosemide (LASIX)  40 MG tablet [id number] Yes TAKE 1 TABLET (40 MG TOTAL) BY MOUTH DAILY. Chris Pier, MD Taking Active   glipiZIDE (GLUCOTROL) 5 MG tablet [id number] Yes TAKE 0.5 TABLETS (2.5 MG TOTAL) BY MOUTH 2 (TWO) TIMES DAILY BEFORE A MEAL. Chris Pier, MD Taking Active   isosorbide-hydrALAZINE (BIDIL) 20-37.5 MG tablet [id number] Yes TAKE 1 TABLET BY MOUTH 3 (THREE) TIMES DAILY. Chris Pier, MD Taking Active         Discontinued 07/11/20 [phone] (Discontinued by provider)   montelukast (SINGULAIR) 10 MG  tablet [id number] Yes TAKE 1 TABLET (10 MG TOTAL) BY MOUTH AT BEDTIME. Chris Pier, MD Taking Active   predniSONE (DELTASONE) 20 MG tablet [id number] Yes TAKE 2 TABLETS BY MOUTH DAILY FOR 3 DAYS, THEN 1.5 TABLETS FOR 3 DAYS, THEN 1 TABLET FOR 3 DAYS THEN 1/2 TABLET FOR 3 DAYS Chris Pier, MD Taking Active   sodium polystyrene (KAYEXALATE) 15 GM/60ML suspension [id number] Yes TAKE 60 MLS (15 G TOTAL) BY MOUTH ONCE FOR 1 DOSE. Chris Pier, MD Taking Active   sodium zirconium cyclosilicate (LOKELMA) 5 g packet [id number] Yes TAKE 5 G BY MOUTH EVERY OTHER DAY FOR 2 DOSES. [name], MD Taking Active           Patient Active Problem List   Diagnosis Date Noted  . COPD exacerbation ([place])   . Typical atrial flutter ([employer])   . Acute respiratory failure with hypoxia ([employer]) 07/05/2020  . CHF (congestive heart failure) ([place]) 07/04/2020  . Acute exacerbation of CHF (congestive heart failure) ([place]) 06/16/2020  . COPD with acute exacerbation ([place]) 06/16/2020  . Influenza vaccine refused 05/06/2020  . Acute on chronic combined systolic (congestive) and diastolic (congestive) heart failure ([place]) 05/05/2020  . Acute decompensated heart failure ([place]) 05/04/2020  . Illiteracy 05/04/2020  . Chronic respiratory failure with hypoxia, on home oxygen therapy ([place]) 12/28/2019  . Type 2 diabetes mellitus with stage 3 chronic kidney disease ([place]) 12/25/2019  . Acute and chronic respiratory  failure (acute-on-chronic) ([place]) 12/25/2019  . Acute on chronic combined systolic and diastolic CHF (congestive heart failure) ([place]) 10/26/2019  . Elevated troponin I level 10/26/2019  . Acute on chronic diastolic (congestive) heart failure ([place]) 10/26/2019  . History of gout 02/01/2019  . Seasonal allergic rhinitis due to pollen 02/01/2019  . Tobacco dependence 11/30/2018  . Microscopic hematuria 11/30/2018  . Depression 11/30/2018  . Difficulty controlling anger 11/30/2018  . CAP (community acquired pneumonia) 08/11/2018  . COPD (chronic obstructive pulmonary disease) ([place])   . CKD (chronic kidney disease) stage 3, GFR 30-59 ml/min (HCC) 08/10/2018  . Recurrent epistaxis 04/21/2018  . Mixed hyperlipidemia 07/28/2017  . Essential hypertension 07/28/2017  . Chronic systolic heart failure ([place]) 10/25/2014  . Cocaine abuse ([place]) 02/20/2013  . Cannabis abuse 02/20/2013  . Back pain, chronic 02/20/2013    Conditions to be addressed/monitored: HTN, Hypertriglyceridemia and DM  Care Plan : Medication Management  Updates made by Lane Hacker, Agency since 10/03/2020 12:00 AM    Problem: Health Promotion or Disease Self-Management (General Plan of Care)     Goal: Medication Management   Note:   Current Barriers:  . Does not adhere to prescribed medication regimen . Does not maintain contact with provider office . Does not contact provider office for questions/concerns .   Pharmacist Clinical Goal(s):  [name] Over the next 30 days, patient will contact provider office for questions/concerns as evidenced notation of same in electronic health record through collaboration with PharmD and provider.  .   Interventions: . Inter-disciplinary care team collaboration (see longitudinal plan of care) . Comprehensive medication review performed; medication list updated in electronic medical record  @RXCPDIABETES @ @RXCPHYPERTENSION @ @RXCPHYPERLIPIDEMIA @  Patient Goals/Self-Care  Activities . Over the next 30 days, patient will:  - collaborate with provider on medication access solutions  Follow Up Plan: The care management team will reach out to the patient again over the next 30 days.     Task: Mutually Develop and Royce Macadamia Achievement of Patient Goals   Note:   Care  Management Activities:    - verbalization of feelings encouraged    Notes:      Medication Assistance: None required. Patient affirms current coverage meets needs.   Follow up: Agree   Plan: The care management team will reach out to the patient again over the next 30 days.   [place] Constable, Pharm.D., Managed Medicaid Pharmacist - [phone]

## 2020-10-13 NOTE — Patient Instructions (Signed)
Visit Information  Chris [name] was given information about Medicaid Managed Care team care coordination services as a part of their Healthy [facility] Medicaid benefit. Chris [name] verbally consented to engagement with the [facility] Managed Care team.   For questions related to your Healthy [facility] health plan, please call: [phone] or visit the homepage here: [email or web address]  If you would like to schedule transportation through your Healthy [facility] plan, please call the following number at least 2 days in advance of your appointment: [phone]   Call the [facility] at [phone], at any time, 24 hours a day, 7 days a week. If you are in danger or need immediate medical attention call 911.  Chris [name] - following are the goals we discussed in your visit today:  Goals Addressed   None     Please see education materials related to DM provided as print materials.   Patient verbalizes understanding of instructions provided today.   The Managed Medicaid care management team will reach out to the patient again over the next 30 days.   [place] Constable, Pharm.D., Managed Medicaid Pharmacist [phone]   Following is a copy of your plan of care:  Patient Care Plan: Heart Failure (Adult)    Problem Identified: Symptom Exacerbation (Heart Failure)     Long-Range Goal: Symptom Exacerbation Prevented or Minimized   Start Date: 07/02/2020  Expected End Date: 12/01/2020  Recent Progress: On track  Priority: High  Note:   Current Barriers:  . Chronic Disease Management, support and educational needs related to management of multiple chronic diagnosis. Chris [name] is managing CHF, DMII, stage III CKD and chronic respiratory failure. He missed his last appointment with his PCP and cardiologist. He reports eating right, checking his blood sugar daily and taking all of his medications. He recently moved and is living  with his brother, desires his own place.  Nurse Case Manager Clinical Goal(s):  . patient will work with [facility] to address needs related to housing . patient will meet with RN Care Manager to address needs related to managing CHF and DMII . patient will demonstrate improved health management independence as evidenced bytaking prescribed medications, reporting any changes in symptoms to PCP such as swelling, changes in breathing, new cough . Patient will work with MM pharmacist, Ovid Curd for medication management  Interventions:  . Inter-disciplinary care team collaboration (see longitudinal plan of care) . Evaluation of current treatment plan related to CHF and DMII and patient's adherence to plan as established by provider. . Advised patient to follow up with PCP and assisted with procuring follow up appointment for 11/12/20 @ 11:10 . Encouraged patient to schedule follow up cardiology appointment . Reviewed medications with patient  . Collaborated with [name] regarding housing need . Discussed plans with patient for ongoing care management follow up and provided patient with direct contact information for care management team . Advised patient, providing education and rationale, to weigh daily and record, calling PCP for weight gain of 3lbs overnight or 5 pounds in a week.  [name] Pharmacy referral for medication review  Patient Goals/Self-Care Activities Over the next 30 days, patient will: -Call to fill prescriptions one week before I run out of medication -Take all medications as prescribed - call office if I gain more than 2 pounds in one day or 5 pounds in one week - use salt in moderation - watch for swelling in feet, ankles and legs every day  - eat more whole  grains, fruits and vegetables, lean meats and healthy fats - know when to call the doctor - track symptoms and what helps feel better or worse - dress right for the weather, hot or cold   Follow  Up Plan: Telephone follow up appointment with Managed Medicaid care management team member scheduled for:10/29/20 @ 9am      Patient Care Plan: General Social Work (Adult)    Problem Identified: Homelessness   Onset Date: 07/09/2020  Note:   Chris [name] is a 59 y.o. year old male who sees [name], MD for primary care. The  Medicaid Managed Care team was consulted for assistance with Housing barriers. Chris [name] was given information about Care Management services, agreed to services, and verbal consent for services was obtained.  Interventions:  . Patient interviewed and appropriate assessments performed . Collaborated with clinical team regarding patient needs  . SDOH (Social Determinants of Health) assessments performed: Yes .     [name] Provided patient with information about the [facility]. [name] contacted [facility] to complete a referral for patient to get into the homeless motel. [name] left a voicemail. [name] informed patient that the process can take a long time. Patient states he does not know why no one wants to help him. [name] informed patient he may be able to go to a shelter in [facility], patient states he does not feel safe in [place].  [name] contacted [employer] in [place] [phone] and left a voicemail for the intake coordinator. . [name] contacted [place] to speak with a Education officer, museum but kept getting transferred.  . Patient states he does not have any money to get a room and become frustrated again stating that we "[place]"do not want to help him. [name] informed patient that she will wait for the [facility] and/or the [facility] to contact her back. [name] informed patient she could provide him with the information for [facility] but patient did not want to call. [name] Update 07/22/20: Patient stated he is doing okay since his surgery but still wants somewhere else to live. Patient stated he knows of a boarding house on [address] and would like  SW to help him get in it. SW researched and could not find a boarding house on [address], Patient stated he would get the name of it and contact [name] with the information.  Plan:  . Over the next 30-60 days, patient will work with [name] to address needs related to Housing barriers . Social Worker will continue to follow up with patient.   [name], [name], [place]  High Risk Managed Medicaid Team         Problem Identified: Depression Identification (Depression)     Long-Range Goal: Ongoing depression   Start Date: 10/07/2020  Priority: High  Note:   Timeframe:  Long-Range Goal Priority:  High Start Date:    10/07/20                        Expected End Date:  12/07/20                    Follow Up Date 10/17/20   - begin personal counseling - call and visit an old friend - check out volunteer opportunities - join a support group - laugh; watch a funny movie or comedian - learn and use visualization or guided imagery - perform a random act of kindness -  practice relaxation or meditation daily - start or continue a personal journal - talk about feelings with a friend, family or spiritual advisor - practice positive thinking and self-talk    Why is this important?    When you are stressed, down or upset, your body reacts too.   For example, your blood pressure may get higher; you may have a headache or stomachache.   When your emotions get the best of you, your body's ability to fight off cold and flu gets weak.   These steps will help you manage your emotions.   Current Barriers:  . Chronic Mental Health needs related to depression and need for housing . Limited social support, Housing barriers, and Mental Health Concerns  . Social Isolation . ADL IADL limitations . Suicidal Ideation/Homicidal Ideation: No  Clinical Social Work Goal(s):  [name] Over the next 120 days, patient will work with SW monthly by telephone or in person to reduce or  manage symptoms related to depression . patient will work with [name] to address needs related to finding stable housing but patient was made aware that housing resources are very limited at this time  Interventions: . Patient interviewed and appropriate assessments performed: brief mental health assessment . PHQ 2 . PHQ 9 SDOH Interventions    Flowsheet Row Most Recent Value  SDOH Interventions   SDOH Interventions for the Following Domains Housing  Housing Interventions Other (Comment)  [Referral for housing support]  Depression Interventions/Treatment  Patient refuses Treatment    .   [name] Patient interviewed and appropriate assessments performed . Discussed plans with patient for ongoing care management follow up and provided patient with direct contact information for care management team . Assisted patient/caregiver with obtaining information about health plan benefits . Encouraged patient to consider a mental health provider for long term follow up and therapy/counseling but patient declined. Patient reports that does not need a psychiatry referral at this time. He reports that his main source of depression is from not being able to find a place on his own. He is currently stable and residing with his brother and his brother's family in their home. Patient reports that his brother leaves for work at 3 pm during the week and gets back home around 1 am. [employer] number provided to patient. Patient reports that his sister has found him a house to relocate in. This house is inherited from the family and will need some additional work done. Patient reports that this house has mold in it but he rather reside there instead of living with his brother. Patient is currently stable and reports no urgent concerns at this time. [name] [facility] LCSW completed care coordination with [facility] [name] on 10/07/20 who will follow up with patient this week. . Patient reports recent agitation due to his inability  to secure housing on this own. [facility] LCSW provided emotional support and brief housing resource education. Patient is currently not on any public housing wait list.   . Solution-Focused Strategies, Mindfulness or Relaxation Training, Active listening / Reflection utilized , Emotional Supportive Provided, and Verbalization of feelings encouraged  . Patient decline any current substance use. He reports that his last use of cocaine was over 6 months ago. . Patient reports that he has developed social anxiety. He admits that he gets irritable at times and will snap at his loved ones and then will experience guilt afterwards. Educated patient on coping methods to implement into his daily life to combat anxiety symptoms and  stress. Patient denied any current suicidal or homicidal ideations. Encouraged patient to implement deep breathing and grounding exercises into his daily routine due to ongoing anxiety and SOB.  Patient Self Care Activities:  . Ability for insight . Independent living . Strong family or social support  Patient Coping Strengths:  . Conservator, museum/gallery . Able to Communicate Effectively  Patient Self Care Deficits:  . Lacks social connections  Depression screen [facility] 2/9 10/07/2020 11/01/2019 09/11/2019 07/09/2019 04/04/2019  Decreased Interest 2 2 0 1 3  Down, Depressed, Hopeless 2 3 1 1 3   PHQ - 2 Score 4 5 1 2 6   Altered sleeping 1 3 3  0 3  Tired, decreased energy 3 2 0 1 3  Change in appetite 0 - 0 1 0  Feeling bad or failure about yourself  2 2 0 0 3  Trouble concentrating 2 2 0 0 3  Moving slowly or fidgety/restless 0 - 0 0 3  Suicidal thoughts 0 0 0 0 0  PHQ-9 Score 12 14 4 4 21   Difficult doing work/chores Somewhat difficult - - - Extremely dIfficult     Task: Identify Depressive Symptoms and Facilitate Treatment   Note:   Care Management Activities:    - depression screen reviewed - participation in psychiatric services encouraged    Notes:    Patient Care Plan:  Medication Management    Problem Identified: Health Promotion or Disease Self-Management (General Plan of Care)     Goal: Medication Management   Note:   Current Barriers:  . Does not adhere to prescribed medication regimen . Does not maintain contact with provider office . Does not contact provider office for questions/concerns .   Pharmacist Clinical Goal(s):  [name] Over the next 30 days, patient will contact provider office for questions/concerns as evidenced notation of same in electronic health record through collaboration with PharmD and provider.  .   Interventions: . Inter-disciplinary care team collaboration (see longitudinal plan of care) . Comprehensive medication review performed; medication list updated in electronic medical record  @RXCPDIABETES @ @RXCPHYPERTENSION @ @RXCPHYPERLIPIDEMIA @  Patient Goals/Self-Care Activities . Over the next 30 days, patient will:  - collaborate with provider on medication access solutions  Follow Up Plan: The care management team will reach out to the patient again over the next 30 days.     Task: Mutually Develop and Royce Macadamia Achievement of Patient Goals   Note:   Care Management Activities:    - reassurance provided    Notes:

## 2020-10-14 ENCOUNTER — Other Ambulatory Visit: Payer: Self-pay

## 2020-10-14 NOTE — Patient Outreach (Signed)
Medicaid Managed Care Social Work Note  10/14/2020 Name:  Chris [name] MRN:  [id number] DOB:  10-16-1960  Chris [name] is an 60 y.o. year old male who is a primary patient of Chris Pier, MD.  The Medicaid Managed Care Coordination team was consulted for assistance with:  housing  [name] was given information about Medicaid Managed CareCoordination services today. Chris [name] agreed to services and verbal consent obtained.  Engaged with patient  for by telephone forfollow up visit in response to referral for case management and/or care coordination services.   Assessments/Interventions:  Review of past medical history, allergies, medications, health status, including review of consultants reports, laboratory and other test data, was performed as part of comprehensive evaluation and provision of chronic care management services.  SDOH: (Social Determinant of Health) assessments and interventions performed:  [name] contacted patient to complete a follow up. Patient states he is currently living with his brother but is ready to be on his own. He states he sister has found a house for him to stay but it needs a lot of work. Patient is asking for assistance with programs that help disabled with fixing up homes. Patient states the home is in [place] and needs windows and flooring done. [name] will research programs that assists with home repairs/modeling. Patient stated he is going to the beach May 6-18. [name] will follow up with patient on May 23 at 3pm.    Advanced Directives Status:  Not addressed in this encounter.  Care Plan                 No Known Allergies  Medications Reviewed Today    Reviewed by [name], RN (Registered Nurse) on 10/01/20 at 1608  Med List Status: <None>  Medication Order Taking? Sig Documenting Provider Last Dose Status Informant  Accu-Chek Softclix Lancets lancets [id number] Yes USE AS INSTRUCTED. INJECT INTO THE SKIN ONCE DAILY  Patient  taking differently: USE AS INSTRUCTED. INJECT INTO THE SKIN ONCE DAILY   Chris Pounds, NP Taking Active   albuterol (PROVENTIL) (2.5 MG/3ML) 0.083% nebulizer solution [id number] Yes TAKE 3 MLS BY NEBULIZATION EVERY 6 (SIX) HOURS AS NEEDED FOR SHORTNESS OF BREATH. Chris Pier, MD Taking Active            Med Note [facility], [place]   Fri Jul 11, 2020  9:19 AM)    albuterol (VENTOLIN HFA) 108 (90 Base) MCG/ACT inhaler [id number] Yes INHALE 2 PUFFS INTO THE LUNGS EVERY 6 (SIX) HOURS AS NEEDED FOR WHEEZING OR SHORTNESS OF BREATH. Chris Pier, MD Taking Active            Med Note Chris [name], [facility]   Fri Jul 04, 2020  9:48 AM)    allopurinol (ZYLOPRIM) 100 MG tablet [id number] Yes TAKE 2 TABLETS (200 MG TOTAL) BY MOUTH DAILY. Chris Pier, MD Taking Active   apixaban (ELIQUIS) 5 MG TABS tablet [id number] Yes TAKE 1 TABLET (5 MG TOTAL) BY MOUTH 2 (TWO) TIMES DAILY. Chris Pier, MD Taking Active   aspirin 81 MG EC tablet [id number] Yes TAKE 1 TABLET (81 MG TOTAL) BY MOUTH DAILY. Chris Pier, MD Taking Active   atorvastatin (LIPITOR) 40 MG tablet [id number] Yes TAKE 1 TABLET (40 MG TOTAL) BY MOUTH DAILY AT 6 PM.  Patient taking differently: Take by mouth daily. at [place], MD Taking Active   budesonide-formoterol [facility]) 160-4.5 MCG/ACT inhaler [id number] Yes INHALE 2  PUFFS INTO THE LUNGS 2 (TWO) TIMES DAILY. Chris Pier, MD Taking Active            Med Note Chris [name], [facility]   Fri Jul 04, 2020  9:46 AM)    carvedilol (COREG) 25 MG tablet [id number] Yes TAKE 1 TABLET (25 MG TOTAL) BY MOUTH 2 (TWO) TIMES DAILY. Chris Sable, MD Taking Active   diclofenac Sodium (VOLTAREN) 1 % GEL [id number] Yes APPLY 2 G TOPICALLY 4 (FOUR) TIMES DAILY. USE ON ELBOW PAIN Chris Anis, MD Taking Active         Discontinued 07/11/20 [phone] (Discontinued by provider)   fluticasone Asencion Islam) 50 MCG/ACT nasal spray [id number] Yes Place 1 spray into both  nostrils daily as needed for allergies or rhinitis. Chris Pick, MD Taking Active Pharmacy Records  furosemide (LASIX) 40 MG tablet [id number] Yes TAKE 1 TABLET (40 MG TOTAL) BY MOUTH DAILY. Chris Pier, MD Taking Active   glipiZIDE (GLUCOTROL) 5 MG tablet [id number] Yes TAKE 0.5 TABLETS (2.5 MG TOTAL) BY MOUTH 2 (TWO) TIMES DAILY BEFORE A MEAL. Chris Pier, MD Taking Active   isosorbide-hydrALAZINE (BIDIL) 20-37.5 MG tablet [id number] Yes TAKE 1 TABLET BY MOUTH 3 (THREE) TIMES DAILY. Chris Pier, MD Taking Active         Discontinued 07/11/20 [phone] (Discontinued by provider)   montelukast (SINGULAIR) 10 MG tablet [id number] Yes TAKE 1 TABLET (10 MG TOTAL) BY MOUTH AT BEDTIME. Chris Pier, MD Taking Active   predniSONE (DELTASONE) 20 MG tablet [id number] Yes TAKE 2 TABLETS BY MOUTH DAILY FOR 3 DAYS, THEN 1.5 TABLETS FOR 3 DAYS, THEN 1 TABLET FOR 3 DAYS THEN 1/2 TABLET FOR 3 DAYS Chris Pier, MD Taking Active   sodium polystyrene (KAYEXALATE) 15 GM/60ML suspension [id number] Yes TAKE 60 MLS (15 G TOTAL) BY MOUTH ONCE FOR 1 DOSE. Chris Pier, MD Taking Active   sodium zirconium cyclosilicate (LOKELMA) 5 g packet [id number] Yes TAKE 5 G BY MOUTH EVERY OTHER DAY FOR 2 DOSES. Chris Hones, MD Taking Active           Patient Active Problem List   Diagnosis Date Noted  . COPD exacerbation ([place])   . Typical atrial flutter ([place])   . Acute respiratory failure with hypoxia ([place]) 07/05/2020  . CHF (congestive heart failure) ([place]) 07/04/2020  . Acute exacerbation of CHF (congestive heart failure) ([place]) 06/16/2020  . COPD with acute exacerbation ([place]) 06/16/2020  . Influenza vaccine refused 05/06/2020  . Acute on chronic combined systolic (congestive) and diastolic (congestive) heart failure ([place]) 05/05/2020  . Acute decompensated heart failure ([place]) 05/04/2020  . Illiteracy 05/04/2020  . Chronic respiratory failure with hypoxia, on home oxygen therapy ([place])  12/28/2019  . Type 2 diabetes mellitus with stage 3 chronic kidney disease ([place]) 12/25/2019  . Acute and chronic respiratory failure (acute-on-chronic) ([place]) 12/25/2019  . Acute on chronic combined systolic and diastolic CHF (congestive heart failure) ([place]) 10/26/2019  . Elevated troponin I level 10/26/2019  . Acute on chronic diastolic (congestive) heart failure ([place]) 10/26/2019  . History of gout 02/01/2019  . Seasonal allergic rhinitis due to pollen 02/01/2019  . Tobacco dependence 11/30/2018  . Microscopic hematuria 11/30/2018  . Depression 11/30/2018  . Difficulty controlling anger 11/30/2018  . CAP (community acquired pneumonia) 08/11/2018  . COPD (chronic obstructive pulmonary disease) ([place])   . CKD (chronic kidney disease) stage 3, GFR 30-59 ml/min (HCC) 08/10/2018  . Recurrent epistaxis 04/21/2018  . Mixed hyperlipidemia 07/28/2017  .  Essential hypertension 07/28/2017  . Chronic systolic heart failure ([place]) 10/25/2014  . Cocaine abuse ([place]) 02/20/2013  . Cannabis abuse 02/20/2013  . Back pain, chronic 02/20/2013    Conditions to be addressed/monitored per PCP order:  housing  Care Plan : General Social Work (Adult)  Updates made by Ethelda Chick since 10/14/2020 12:00 AM    Problem: Homelessness   Onset Date: 07/09/2020  Note:   Chris [name] is a 60 y.o. year old male who sees Chris Pier, MD for primary care. The  Medicaid Managed Care team was consulted for assistance with Housing barriers. [name] was given information about Care Management services, agreed to services, and verbal consent for services was obtained.  Interventions:  . Patient interviewed and appropriate assessments performed . Collaborated with clinical team regarding patient needs  . SDOH (Social Determinants of Health) assessments performed: Yes .     [name] Provided patient with information about the [facility]. [name] contacted [facility] to complete a referral for patient to  get into the homeless motel. [name] left a voicemail. [name] informed patient that the process can take a long time. Patient states he does not know why no one wants to help him. [name] informed patient he may be able to go to a shelter in [facility], patient states he does not feel safe in [place].  [name] contacted [employer] in [place] [phone] and left a voicemail for the intake coordinator. . [name] contacted [facility] to speak with a Education officer, museum but kept getting transferred.  . Patient states he does not have any money to get a room and become frustrated again stating that we "[place]"do not want to help him. [name] informed patient that she will wait for the [facility] and/or the [facility] to contact her back. [name] informed patient she could provide him with the information for [facility] but patient did not want to call. [name] Update 07/22/20: Patient stated he is doing okay since his surgery but still wants somewhere else to live. Patient stated he knows of a boarding house on [address] and would like SW to help him get in it. SW researched and could not find a boarding house on [address], Patient stated he would get the name of it and contact [name] with the information. [name] Update 10/14/20: [name] contacted patient to complete a follow up. Patient states he is currently living with his brother but is ready to be on his own. He states he sister has found a house for him to stay but it needs a lot of work. Patient is asking for assistance with programs that help disabled with fixing up homes. Patient states the home is in [place] and needs windows and flooring done. [name] will research programs that assists with home repairs/modeling. Patient stated he is going to the beach May 6-18. [name] will follow up with patient on May 23 at 3pm.  Plan:  . Over the next 30-60 days, patient will work with [name] to address needs related to Housing barriers . Social Worker will continue to follow up with  patient.   [name], [name], [place]  High Risk Managed Medicaid Team           Follow up:  Patient agrees to Care Plan and Follow-up.  Plan: The Managed Medicaid care management team will reach out to the patient again over the next 14 days.  Date/time of next scheduled Social Work care  management/care coordination outreach:  11/03/20  [name], [name], [place]  High Risk Managed Medicaid Team

## 2020-10-14 NOTE — Patient Instructions (Signed)
Visit Information  [name] was given information about Medicaid Managed Care team care coordination services as a part of their Healthy [facility] Medicaid benefit. Chris [name] verbally consented to engagement with the [facility] Managed Care team.   For questions related to your Healthy [facility] health plan, please call: [phone] or visit the homepage here: [email or web address]  If you would like to schedule transportation through your Healthy [facility] plan, please call the following number at least 2 days in advance of your appointment: [phone]   Call the [facility] at [phone], at any time, 24 hours a day, 7 days a week. If you are in danger or need immediate medical attention call 911.  [name] - following are the goals we discussed in your visit today:  Goals Addressed   None      Social Worker will follow up in 14 days.   Chris [name], [name], [place]  High Risk Managed Medicaid Team    Following is a copy of your plan of care:

## 2020-10-17 ENCOUNTER — Ambulatory Visit: Payer: Self-pay

## 2020-10-17 ENCOUNTER — Telehealth: Payer: Self-pay | Admitting: Licensed Clinical Social Worker

## 2020-10-17 NOTE — Patient Outreach (Signed)
[facility]) Care Management  [facility] Social Work  10/17/2020  Chris [name] December 13, 1960 [id number]  Encounter Medications:  Outpatient Encounter Medications as of 10/17/2020  Medication Sig  . Accu-Chek Softclix Lancets lancets USE AS INSTRUCTED. INJECT INTO THE SKIN ONCE DAILY (Patient taking differently: USE AS INSTRUCTED. INJECT INTO THE SKIN ONCE DAILY)  . albuterol (PROVENTIL) (2.5 MG/3ML) 0.083% nebulizer solution TAKE 3 MLS BY NEBULIZATION EVERY 6 (SIX) HOURS AS NEEDED FOR SHORTNESS OF BREATH.  [name] albuterol (VENTOLIN HFA) 108 (90 Base) MCG/ACT inhaler INHALE 2 PUFFS INTO THE LUNGS EVERY 6 (SIX) HOURS AS NEEDED FOR WHEEZING OR SHORTNESS OF BREATH.  [name] allopurinol (ZYLOPRIM) 100 MG tablet TAKE 2 TABLETS (200 MG TOTAL) BY MOUTH DAILY.  [name] apixaban (ELIQUIS) 5 MG TABS tablet TAKE 1 TABLET (5 MG TOTAL) BY MOUTH 2 (TWO) TIMES DAILY.  [name] aspirin 81 MG EC tablet TAKE 1 TABLET (81 MG TOTAL) BY MOUTH DAILY.  [name] atorvastatin (LIPITOR) 40 MG tablet TAKE 1 TABLET (40 MG TOTAL) BY MOUTH DAILY AT 6 PM. (Patient taking differently: Take by mouth daily. at 6pm)  . budesonide-formoterol (SYMBICORT) 160-4.5 MCG/ACT inhaler INHALE 2 PUFFS INTO THE LUNGS 2 (TWO) TIMES DAILY.  . carvedilol (COREG) 25 MG tablet TAKE 1 TABLET (25 MG TOTAL) BY MOUTH 2 (TWO) TIMES DAILY.  [name] colchicine 0.6 MG tablet Take 2 tabs (1.2 mg) at the onset of a gout flare, may repeat 1 tab (0.6 mg) after 2 hours if symptoms persist.  . dapagliflozin propanediol (FARXIGA) 10 MG TABS tablet Take 10 mg by mouth 1 (one) time each day  . diclofenac Sodium (VOLTAREN) 1 % GEL APPLY 2 G TOPICALLY 4 (FOUR) TIMES DAILY. USE ON ELBOW PAIN  . fluticasone (FLONASE) 50 MCG/ACT nasal spray Place 1 spray into both nostrils daily as needed for allergies or rhinitis.  . furosemide (LASIX) 40 MG tablet TAKE 1 TABLET (40 MG TOTAL) BY MOUTH DAILY.  [name] glipiZIDE (GLUCOTROL) 5 MG tablet TAKE 0.5 TABLETS (2.5 MG TOTAL) BY MOUTH 2 (TWO) TIMES DAILY  BEFORE A MEAL.  . isosorbide-hydrALAZINE (BIDIL) 20-37.5 MG tablet TAKE 1 TABLET BY MOUTH 3 (THREE) TIMES DAILY.  . montelukast (SINGULAIR) 10 MG tablet TAKE 1 TABLET (10 MG TOTAL) BY MOUTH AT BEDTIME.  . predniSONE (DELTASONE) 20 MG tablet TAKE 2 TABLETS BY MOUTH DAILY FOR 3 DAYS, THEN 1.5 TABLETS FOR 3 DAYS, THEN 1 TABLET FOR 3 DAYS THEN 1/2 TABLET FOR 3 DAYS  . sodium polystyrene (KAYEXALATE) 15 GM/60ML suspension TAKE 60 MLS (15 G TOTAL) BY MOUTH ONCE FOR 1 DOSE.  . sodium zirconium cyclosilicate (LOKELMA) 5 g packet TAKE 5 G BY MOUTH EVERY OTHER DAY FOR 2 DOSES.  . [DISCONTINUED] diltiazem (CARDIZEM CD) 180 MG 24 hr capsule Take 1 capsule (180 mg total) by mouth daily.  . [DISCONTINUED] metoprolol tartrate (LOPRESSOR) 100 MG tablet Take 1 tablet (100 mg total) by mouth 2 (two) times daily.   No facility-administered encounter medications on file as of 10/17/2020.    Functional Status:  In your present state of health, do you have any difficulty performing the following activities: 07/17/2020 07/05/2020  Hearing? N N  Vision? N N  Difficulty concentrating or making decisions? N N  Walking or climbing stairs? N Y  Dressing or bathing? N N  Doing errands, shopping? N Y  Some recent data might be hidden    Fall/Depression Screening:  PHQ 2/9 Scores 10/07/2020 11/01/2019 09/11/2019 07/09/2019 04/04/2019 11/30/2018 07/05/2017  PHQ - 2 Score  4 5 1 2 6  0 3  PHQ- 9 Score 12 14 4 4 21  - 9    LCSW completed CCM outreach attempt today but was unable to reach patient successfully. A HIPPA compliant voice message was unable to be left encouraging patient to return call once available. LCSW will reschedule patient's CCM Social Work appointment if no return call has been made.  Plan:  Follow-up: [facility] LCSW will schedule follow up appointment for 10/23/20 if no return call has been made by the end of the day.  Dickie La, [name], MSW, [employer] Managed Medicaid LCSW [facility]  [facility] [email or web address] Phone: [phone]

## 2020-10-17 NOTE — Patient Instructions (Signed)
As a part of your Medicaid benefit, you are eligible for care management and care coordination services at no cost or copay. I was unable to reach you by phone today but would be happy to help you with your health related needs. Please feel free to call me @ [phone].    A member of the Managed Medicaid care management team will reach out to you again over the next 7-14 days.   Eula Fried, [name], MSW, [employer] Managed Medicaid [email or web address] [email or web address] Phone: [phone]

## 2020-10-23 ENCOUNTER — Ambulatory Visit: Payer: Self-pay

## 2020-10-23 ENCOUNTER — Telehealth: Payer: Self-pay | Admitting: Licensed Clinical Social Worker

## 2020-10-23 NOTE — Patient Instructions (Signed)
Chris [name] ,   The Medicaid Managed Care Team is available to provide assistance to you with your healthcare needs at no cost and as a benefit of your Medicaid Health plan. Please reach out to me at the number below. I am available to be of assistance to you regarding your healthcare needs. .   Thank you,   Nella Botsford, [name], MSW, LCSW Managed Medicaid LCSW [place]  [facility] [email or web address] Phone: [phone]   

## 2020-10-23 NOTE — Patient Outreach (Signed)
[facility]) Care Management  [facility] Social Work  10/23/2020  Chris [name] 07-Sep-1960 [id number]   Encounter Medications:  Outpatient Encounter Medications as of 10/23/2020  Medication Sig  . Accu-Chek Softclix Lancets lancets USE AS INSTRUCTED. INJECT INTO THE SKIN ONCE DAILY (Patient taking differently: USE AS INSTRUCTED. INJECT INTO THE SKIN ONCE DAILY)  . albuterol (PROVENTIL) (2.5 MG/3ML) 0.083% nebulizer solution TAKE 3 MLS BY NEBULIZATION EVERY 6 (SIX) HOURS AS NEEDED FOR SHORTNESS OF BREATH.  [name] albuterol (VENTOLIN HFA) 108 (90 Base) MCG/ACT inhaler INHALE 2 PUFFS INTO THE LUNGS EVERY 6 (SIX) HOURS AS NEEDED FOR WHEEZING OR SHORTNESS OF BREATH.  [name] allopurinol (ZYLOPRIM) 100 MG tablet TAKE 2 TABLETS (200 MG TOTAL) BY MOUTH DAILY.  [name] apixaban (ELIQUIS) 5 MG TABS tablet TAKE 1 TABLET (5 MG TOTAL) BY MOUTH 2 (TWO) TIMES DAILY.  [name] aspirin 81 MG EC tablet TAKE 1 TABLET (81 MG TOTAL) BY MOUTH DAILY.  [name] atorvastatin (LIPITOR) 40 MG tablet TAKE 1 TABLET (40 MG TOTAL) BY MOUTH DAILY AT 6 PM. (Patient taking differently: Take by mouth daily. at 6pm)  . budesonide-formoterol (SYMBICORT) 160-4.5 MCG/ACT inhaler INHALE 2 PUFFS INTO THE LUNGS 2 (TWO) TIMES DAILY.  . carvedilol (COREG) 25 MG tablet TAKE 1 TABLET (25 MG TOTAL) BY MOUTH 2 (TWO) TIMES DAILY.  [name] colchicine 0.6 MG tablet Take 2 tabs (1.2 mg) at the onset of a gout flare, may repeat 1 tab (0.6 mg) after 2 hours if symptoms persist.  . dapagliflozin propanediol (FARXIGA) 10 MG TABS tablet Take 10 mg by mouth 1 (one) time each day  . diclofenac Sodium (VOLTAREN) 1 % GEL APPLY 2 G TOPICALLY 4 (FOUR) TIMES DAILY. USE ON ELBOW PAIN  . fluticasone (FLONASE) 50 MCG/ACT nasal spray Place 1 spray into both nostrils daily as needed for allergies or rhinitis.  . furosemide (LASIX) 40 MG tablet TAKE 1 TABLET (40 MG TOTAL) BY MOUTH DAILY.  [name] glipiZIDE (GLUCOTROL) 5 MG tablet TAKE 0.5 TABLETS (2.5 MG TOTAL) BY MOUTH 2 (TWO) TIMES  DAILY BEFORE A MEAL.  . isosorbide-hydrALAZINE (BIDIL) 20-37.5 MG tablet TAKE 1 TABLET BY MOUTH 3 (THREE) TIMES DAILY.  . montelukast (SINGULAIR) 10 MG tablet TAKE 1 TABLET (10 MG TOTAL) BY MOUTH AT BEDTIME.  . predniSONE (DELTASONE) 20 MG tablet TAKE 2 TABLETS BY MOUTH DAILY FOR 3 DAYS, THEN 1.5 TABLETS FOR 3 DAYS, THEN 1 TABLET FOR 3 DAYS THEN 1/2 TABLET FOR 3 DAYS  . sodium polystyrene (KAYEXALATE) 15 GM/60ML suspension TAKE 60 MLS (15 G TOTAL) BY MOUTH ONCE FOR 1 DOSE.  . sodium zirconium cyclosilicate (LOKELMA) 5 g packet TAKE 5 G BY MOUTH EVERY OTHER DAY FOR 2 DOSES.  . [DISCONTINUED] diltiazem (CARDIZEM CD) 180 MG 24 hr capsule Take 1 capsule (180 mg total) by mouth daily.  . [DISCONTINUED] metoprolol tartrate (LOPRESSOR) 100 MG tablet Take 1 tablet (100 mg total) by mouth 2 (two) times daily.   No facility-administered encounter medications on file as of 10/23/2020.    Functional Status:  In your present state of health, do you have any difficulty performing the following activities: 07/17/2020 07/05/2020  Hearing? N N  Vision? N N  Difficulty concentrating or making decisions? N N  Walking or climbing stairs? N Y  Dressing or bathing? N N  Doing errands, shopping? N Y  Some recent data might be hidden    Fall/Depression Screening:  PHQ 2/9 Scores 10/07/2020 11/01/2019 09/11/2019 07/09/2019 04/04/2019 11/30/2018 07/05/2017  PHQ - 2  Score 4 5 1 2 6  0 3  PHQ- 9 Score 12 14 4 4 21  - 9   LCSW completed [facility] outreach attempt today but was unable to reach patient successfully. A HIPPA compliant voice message was unable to be left. LCSW will reschedule patient's [facility] Social Work appointment if no return call has been made.   Dickie La, [name], MSW, [employer] Managed Medicaid LCSW [facility]  [facility] [email or web address] Phone: [phone]  Plan:  Follow-up:  [facility] LCSW will reschedule appointment for 11/06/20 if no return call has been made.

## 2020-10-28 ENCOUNTER — Emergency Department (HOSPITAL_COMMUNITY): Payer: Medicaid Other

## 2020-10-28 ENCOUNTER — Inpatient Hospital Stay (HOSPITAL_COMMUNITY): Payer: Medicaid Other

## 2020-10-28 ENCOUNTER — Encounter (HOSPITAL_COMMUNITY): Payer: Self-pay

## 2020-10-28 ENCOUNTER — Observation Stay (HOSPITAL_COMMUNITY)
Admission: EM | Admit: 2020-10-28 | Discharge: 2020-10-29 | Discharge status: Against Medical Advice | Payer: Medicaid Other | Attending: Internal Medicine | Admitting: Internal Medicine

## 2020-10-28 DIAGNOSIS — R4701 Aphasia: Secondary | ICD-10-CM | POA: Insufficient documentation

## 2020-10-28 DIAGNOSIS — E1122 Type 2 diabetes mellitus with diabetic chronic kidney disease: Secondary | ICD-10-CM | POA: Insufficient documentation

## 2020-10-28 DIAGNOSIS — L02811 Cutaneous abscess of head [any part, except face]: Secondary | ICD-10-CM | POA: Diagnosis not present

## 2020-10-28 DIAGNOSIS — N183 Chronic kidney disease, stage 3 unspecified: Secondary | ICD-10-CM | POA: Diagnosis not present

## 2020-10-28 DIAGNOSIS — F17210 Nicotine dependence, cigarettes, uncomplicated: Secondary | ICD-10-CM | POA: Insufficient documentation

## 2020-10-28 DIAGNOSIS — Z20822 Contact with and (suspected) exposure to covid-19: Secondary | ICD-10-CM | POA: Diagnosis not present

## 2020-10-28 DIAGNOSIS — Z7984 Long term (current) use of oral hypoglycemic drugs: Secondary | ICD-10-CM | POA: Insufficient documentation

## 2020-10-28 DIAGNOSIS — Z7982 Long term (current) use of aspirin: Secondary | ICD-10-CM | POA: Insufficient documentation

## 2020-10-28 DIAGNOSIS — L03811 Cellulitis of head [any part, except face]: Secondary | ICD-10-CM | POA: Diagnosis not present

## 2020-10-28 DIAGNOSIS — R531 Weakness: Principal | ICD-10-CM | POA: Insufficient documentation

## 2020-10-28 DIAGNOSIS — R2990 Unspecified symptoms and signs involving the nervous system: Secondary | ICD-10-CM

## 2020-10-28 DIAGNOSIS — I671 Cerebral aneurysm, nonruptured: Secondary | ICD-10-CM | POA: Diagnosis not present

## 2020-10-28 DIAGNOSIS — I5042 Chronic combined systolic (congestive) and diastolic (congestive) heart failure: Secondary | ICD-10-CM | POA: Insufficient documentation

## 2020-10-28 DIAGNOSIS — J449 Chronic obstructive pulmonary disease, unspecified: Secondary | ICD-10-CM | POA: Insufficient documentation

## 2020-10-28 DIAGNOSIS — I10 Essential (primary) hypertension: Secondary | ICD-10-CM | POA: Diagnosis not present

## 2020-10-28 DIAGNOSIS — I130 Hypertensive heart and chronic kidney disease with heart failure and stage 1 through stage 4 chronic kidney disease, or unspecified chronic kidney disease: Secondary | ICD-10-CM | POA: Insufficient documentation

## 2020-10-28 DIAGNOSIS — I639 Cerebral infarction, unspecified: Secondary | ICD-10-CM | POA: Diagnosis not present

## 2020-10-28 DIAGNOSIS — R079 Chest pain, unspecified: Secondary | ICD-10-CM | POA: Diagnosis not present

## 2020-10-28 DIAGNOSIS — H0520 Unspecified exophthalmos: Secondary | ICD-10-CM | POA: Diagnosis not present

## 2020-10-28 DIAGNOSIS — R404 Transient alteration of awareness: Secondary | ICD-10-CM | POA: Diagnosis not present

## 2020-10-28 DIAGNOSIS — R29818 Other symptoms and signs involving the nervous system: Secondary | ICD-10-CM | POA: Diagnosis not present

## 2020-10-28 DIAGNOSIS — R4020 Unspecified coma: Secondary | ICD-10-CM | POA: Diagnosis not present

## 2020-10-28 DIAGNOSIS — Z79899 Other long term (current) drug therapy: Secondary | ICD-10-CM | POA: Insufficient documentation

## 2020-10-28 DIAGNOSIS — Z7901 Long term (current) use of anticoagulants: Secondary | ICD-10-CM | POA: Diagnosis not present

## 2020-10-28 DIAGNOSIS — R29810 Facial weakness: Secondary | ICD-10-CM | POA: Diagnosis not present

## 2020-10-28 LAB — COMPREHENSIVE METABOLIC PANEL
ALT: 26 U/L (ref 0–44)
AST: 24 U/L (ref 15–41)
Albumin: 3.8 g/dL (ref 3.5–5.0)
Alkaline Phosphatase: 92 U/L (ref 38–126)
Anion gap: 10 (ref 5–15)
BUN: 16 mg/dL (ref 6–20)
CO2: 27 mmol/L (ref 22–32)
Calcium: 9.4 mg/dL (ref 8.9–10.3)
Chloride: 103 mmol/L (ref 98–111)
Creatinine, Ser: 1.46 mg/dL — ABNORMAL HIGH (ref 0.61–1.24)
GFR, Estimated: 55 mL/min — ABNORMAL LOW (ref >60)
Glucose, Bld: 80 mg/dL (ref 70–99)
Potassium: 4.3 mmol/L (ref 3.5–5.1)
Sodium: 140 mmol/L (ref 135–145)
Total Bilirubin: 0.9 mg/dL (ref 0.3–1.2)
Total Protein: 7.5 g/dL (ref 6.5–8.1)

## 2020-10-28 LAB — DIFFERENTIAL
Abs Immature Granulocytes: 0.12 10*3/uL — ABNORMAL HIGH (ref 0.00–0.07)
Basophils Absolute: 0.1 10*3/uL (ref 0.0–0.1)
Basophils Relative: 1 %
Eosinophils Absolute: 0.7 10*3/uL — ABNORMAL HIGH (ref 0.0–0.5)
Eosinophils Relative: 5 %
Immature Granulocytes: 1 %
Lymphocytes Relative: 25 %
Lymphs Abs: 3.5 10*3/uL (ref 0.7–4.0)
Monocytes Absolute: 1.4 10*3/uL — ABNORMAL HIGH (ref 0.1–1.0)
Monocytes Relative: 10 %
Neutro Abs: 8.3 10*3/uL — ABNORMAL HIGH (ref 1.7–7.7)
Neutrophils Relative %: 58 %

## 2020-10-28 LAB — CBC
HCT: 47.8 % (ref 39.0–52.0)
Hemoglobin: 15.2 g/dL (ref 13.0–17.0)
MCH: 29.6 pg (ref 26.0–34.0)
MCHC: 31.8 g/dL (ref 30.0–36.0)
MCV: 93.2 fL (ref 80.0–100.0)
Platelets: 238 10*3/uL (ref 150–400)
RBC: 5.13 MIL/uL (ref 4.22–5.81)
RDW: 17.2 % — ABNORMAL HIGH (ref 11.5–15.5)
WBC: 14 10*3/uL — ABNORMAL HIGH (ref 4.0–10.5)
nRBC: 0 % (ref 0.0–0.2)

## 2020-10-28 LAB — I-STAT CHEM 8, ED
BUN: 19 mg/dL (ref 6–20)
Calcium, Ion: 1.22 mmol/L (ref 1.15–1.40)
Chloride: 104 mmol/L (ref 98–111)
Creatinine, Ser: 1.4 mg/dL — ABNORMAL HIGH (ref 0.61–1.24)
Glucose, Bld: 80 mg/dL (ref 70–99)
HCT: 45 % (ref 39.0–52.0)
Hemoglobin: 15.3 g/dL (ref 13.0–17.0)
Potassium: 4.2 mmol/L (ref 3.5–5.1)
Sodium: 141 mmol/L (ref 135–145)
TCO2: 27 mmol/L (ref 22–32)

## 2020-10-28 LAB — CBG MONITORING, ED
Glucose-Capillary: 81 mg/dL (ref 70–99)
Glucose-Capillary: 83 mg/dL (ref 70–99)
Glucose-Capillary: 141 mg/dL — ABNORMAL HIGH (ref 70–99)

## 2020-10-28 LAB — PROTIME-INR
INR: 1 (ref 0.8–1.2)
Prothrombin Time: 13.2 seconds (ref 11.4–15.2)

## 2020-10-28 LAB — APTT: aPTT: 30 seconds (ref 24–36)

## 2020-10-28 LAB — SARS CORONAVIRUS 2 (TAT 6-24 HRS): SARS Coronavirus 2: NEGATIVE

## 2020-10-28 LAB — TROPONIN I (HIGH SENSITIVITY): Troponin I (High Sensitivity): 24 ng/L — ABNORMAL HIGH (ref <18)

## 2020-10-28 MED ORDER — ALBUTEROL SULFATE (2.5 MG/3ML) 0.083% IN NEBU: 2.5000 mg | INHALATION_SOLUTION | Freq: Four times a day (QID) | RESPIRATORY_TRACT | Status: DC | PRN

## 2020-10-28 MED ORDER — ISOSORB DINITRATE-HYDRALAZINE 20-37.5 MG PO TABS
1.0000 | ORAL_TABLET | Freq: Three times a day (TID) | ORAL | Status: DC
  Administered 2020-10-28 – 2020-10-29 (×2): 1 via ORAL
  Filled 2020-10-28 (×5): qty 1

## 2020-10-28 MED ORDER — SODIUM CHLORIDE 0.9% FLUSH
3.0000 mL | Freq: Two times a day (BID) | INTRAVENOUS | Status: DC
  Administered 2020-10-29: 3 mL via INTRAVENOUS

## 2020-10-28 MED ORDER — ACETAMINOPHEN 500 MG PO TABS
1000.0000 mg | ORAL_TABLET | Freq: Once | ORAL | Status: AC
  Administered 2020-10-28: 1000 mg via ORAL
  Filled 2020-10-28: qty 2

## 2020-10-28 MED ORDER — SODIUM CHLORIDE 0.9 % IV SOLN: 250.0000 mL | INTRAVENOUS | Status: DC | PRN

## 2020-10-28 MED ORDER — SULFAMETHOXAZOLE-TRIMETHOPRIM 800-160 MG PO TABS
1.0000 | ORAL_TABLET | Freq: Two times a day (BID) | ORAL | Status: DC
  Administered 2020-10-28 – 2020-10-29 (×3): 1 via ORAL
  Filled 2020-10-28 (×3): qty 1

## 2020-10-28 MED ORDER — ACETAMINOPHEN 325 MG PO TABS
650.0000 mg | ORAL_TABLET | Freq: Four times a day (QID) | ORAL | Status: DC | PRN
  Administered 2020-10-28 – 2020-10-29 (×3): 650 mg via ORAL
  Filled 2020-10-28 (×3): qty 2

## 2020-10-28 MED ORDER — SODIUM CHLORIDE 0.9% FLUSH: 3.0000 mL | INTRAVENOUS | Status: DC | PRN

## 2020-10-28 MED ORDER — ACETAMINOPHEN 650 MG RE SUPP: 650.0000 mg | Freq: Four times a day (QID) | RECTAL | Status: DC | PRN

## 2020-10-28 MED ORDER — ATORVASTATIN CALCIUM 40 MG PO TABS
40.0000 mg | ORAL_TABLET | Freq: Every day | ORAL | Status: DC
  Administered 2020-10-29: 40 mg via ORAL
  Filled 2020-10-28: qty 1

## 2020-10-28 MED ORDER — IOHEXOL 350 MG/ML SOLN
100.0000 mL | Freq: Once | INTRAVENOUS | Status: AC | PRN
  Administered 2020-10-28: 100 mL via INTRAVENOUS

## 2020-10-28 MED ORDER — INSULIN ASPART 100 UNIT/ML IJ SOLN: 0.0000 [IU] | Freq: Three times a day (TID) | INTRAMUSCULAR | Status: DC

## 2020-10-28 MED ORDER — SODIUM CHLORIDE 0.9% FLUSH: 3.0000 mL | Freq: Once | INTRAVENOUS | Status: DC

## 2020-10-28 MED ORDER — APIXABAN 5 MG PO TABS
5.0000 mg | ORAL_TABLET | Freq: Two times a day (BID) | ORAL | Status: DC
  Administered 2020-10-28 – 2020-10-29 (×2): 5 mg via ORAL
  Filled 2020-10-28 (×2): qty 1

## 2020-10-28 NOTE — Progress Notes (Signed)
EEG completed-within normal limits. According to the ED provider signout later, he was starting to talk to him. As documented in my consultation note, I suspect more psychogenic etiology but given his cerebrovascular risk factors, an echo definitely should be considered as he was found unresponsive to rule out any cause of cardiogenic syncope. I am not suspicious for seizures. Neurology service will sign off at this time and will be available as needed. Please call with questions   -- Amie [place], MD Neurologist [facility] Pager: [phone]  No-charge note

## 2020-10-28 NOTE — Code Documentation (Addendum)
Pt is a 60 yr old male with history of a flutter, CHF with low EF, COPD and DM. He had previosly been anticoagulated with Eliquis, but had [facility] off [facility]. He was last known to be well at [facility] 0[facility]. 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 [facility] at [facility] at 1111. Labs/CBG obtained, cleared at bridge by EDP. 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 He was taken to CT at 1115. CTNC negative for acute abnormality per Dr Rory Percy. Additional IV access obtained, then CTA and CTP done. Pt taken to MRI at 1140 to r/o ischemia. At 1155, Dr Rory Percy stated that MRI is negative for stroke, and Code Stroke was cancelled. Bedside handoff with Katrina RN complete. No TPA, no NIR as workup negative for stroke.

## 2020-10-28 NOTE — Consult Note (Signed)
Neurology Consultation  Reason for Consult: Code stroke for left-sided weakness, left gaze, left facial droop Referring Physician: Dr. Sherwood Gambler  CC: Left-sided weakness left facial droop  History is obtained from: Chart review, EMS, guards from the prison where he is being brought from  HPI: Chris [name] is a 60 y.o. male past medical history of atrial fibrillation was supposed to be on Eliquis status post cardioversion and current records from the medication list at the jail does not list Eliquis as one of the medications and is only on aspirin, COPD, congestive heart failure with EF of 25%, hypertension, depression, currently an inmate, brought in for concerns for strokelike symptoms. Last known well 7 AM and he was rounded on. Shortly after was found unresponsive with left-sided weakness, left facial droop and inability to talk.  Extremely hypertensive blood pressure systolic 338S EMS was called, activated a code stroke and brought him to the ER. He is unable to provide any history-he does not talk but he signs and follows all commands. He was able to relay the fact with signs that his head hurts on the left side and upon asking him to lift the left arm and leg and passively trying to move it, he groaned in pain.  Stat CT head negative for acute process Stat CTA with no evidence of LVO Stat MRI preliminary read negative for stroke.   LKW: 7 AM on 10/28/2020 tpa given?: no, exam not consistent with a stroke, confirmed with stat imaging Premorbid modified Rankin scale (mRS): Presumably 0   ROS:  Unable to obtain due to altered mental status.   Past Medical History:  Diagnosis Date  . Arrhythmia    atrial flutter  . CHF (congestive heart failure) ([place])   . Chronic kidney disease   . COPD (chronic obstructive pulmonary disease) ([place])   . Coronary artery disease   . Depression   . Diabetes mellitus without complication ([place])   . GERD (gastroesophageal reflux disease)   .  Gout   . Hypertension   . Influenza A with respiratory manifestations   . Mental disorder     Family History  Problem Relation Age of Onset  . Heart disease Father   . Diabetes Mother   . HIV Brother   . Healthy Son   . Healthy Daughter      Social History:   reports that he has been smoking cigarettes. He has a 20.00 pack-year smoking history. He has never used smokeless tobacco. He reports current drug use. Frequency: 21.00 times per week. Drugs: Marijuana and Cocaine. He reports that he does not drink alcohol. Medications  Current Facility-Administered Medications:  .  sodium chloride flush (NS) 0.9 % injection 3 mL, 3 mL, Intravenous, Once, Sherwood Gambler, MD  Current Outpatient Medications:  .  Accu-Chek Softclix Lancets lancets, USE AS INSTRUCTED. INJECT INTO THE SKIN ONCE DAILY (Patient taking differently: USE AS INSTRUCTED. INJECT INTO THE SKIN ONCE DAILY), Disp: 100 each, Rfl: 3 .  albuterol (PROVENTIL) (2.5 MG/3ML) 0.083% nebulizer solution, TAKE 3 MLS BY NEBULIZATION EVERY 6 (SIX) HOURS AS NEEDED FOR SHORTNESS OF BREATH., Disp: 90 mL, Rfl: 1 .  albuterol (VENTOLIN HFA) 108 (90 Base) MCG/ACT inhaler, INHALE 2 PUFFS INTO THE LUNGS EVERY 6 (SIX) HOURS AS NEEDED FOR WHEEZING OR SHORTNESS OF BREATH., Disp: 8.5 g, Rfl: 2 .  allopurinol (ZYLOPRIM) 100 MG tablet, TAKE 2 TABLETS (200 MG TOTAL) BY MOUTH DAILY., Disp: 60 tablet, Rfl: 4 .  apixaban (ELIQUIS) 5 MG TABS  tablet, TAKE 1 TABLET (5 MG TOTAL) BY MOUTH 2 (TWO) TIMES DAILY., Disp: 60 tablet, Rfl: 3 .  aspirin 81 MG EC tablet, TAKE 1 TABLET (81 MG TOTAL) BY MOUTH DAILY., Disp: 30 tablet, Rfl: 2 .  atorvastatin (LIPITOR) 40 MG tablet, TAKE 1 TABLET (40 MG TOTAL) BY MOUTH DAILY AT 6 PM. (Patient taking differently: Take by mouth daily. at 6pm), Disp: 30 tablet, Rfl: 2 .  budesonide-formoterol (SYMBICORT) 160-4.5 MCG/ACT inhaler, INHALE 2 PUFFS INTO THE LUNGS 2 (TWO) TIMES DAILY., Disp: 10.2 g, Rfl: 2 .  carvedilol (COREG) 25 MG  tablet, TAKE 1 TABLET (25 MG TOTAL) BY MOUTH 2 (TWO) TIMES DAILY., Disp: 60 tablet, Rfl: 3 .  colchicine 0.6 MG tablet, Take 2 tabs (1.2 mg) at the onset of a gout flare, may repeat 1 tab (0.6 mg) after 2 hours if symptoms persist., Disp: 30 tablet, Rfl: 1 .  dapagliflozin propanediol (FARXIGA) 10 MG TABS tablet, Take 10 mg by mouth 1 (one) time each day, Disp: 30 tablet, Rfl: 11 .  diclofenac Sodium (VOLTAREN) 1 % GEL, APPLY 2 G TOPICALLY 4 (FOUR) TIMES DAILY. USE ON ELBOW PAIN, Disp: 50 g, Rfl: 3 .  fluticasone (FLONASE) 50 MCG/ACT nasal spray, Place 1 spray into both nostrils daily as needed for allergies or rhinitis., Disp: 16 g, Rfl: 2 .  furosemide (LASIX) 40 MG tablet, TAKE 1 TABLET (40 MG TOTAL) BY MOUTH DAILY., Disp: 90 tablet, Rfl: 2 .  glipiZIDE (GLUCOTROL) 5 MG tablet, TAKE 0.5 TABLETS (2.5 MG TOTAL) BY MOUTH 2 (TWO) TIMES DAILY BEFORE A MEAL., Disp: 30 tablet, Rfl: 4 .  isosorbide-hydrALAZINE (BIDIL) 20-37.5 MG tablet, TAKE 1 TABLET BY MOUTH 3 (THREE) TIMES DAILY., Disp: 90 tablet, Rfl: 4 .  montelukast (SINGULAIR) 10 MG tablet, TAKE 1 TABLET (10 MG TOTAL) BY MOUTH AT BEDTIME., Disp: 30 tablet, Rfl: 2 .  predniSONE (DELTASONE) 20 MG tablet, TAKE 2 TABLETS BY MOUTH DAILY FOR 3 DAYS, THEN 1.5 TABLETS FOR 3 DAYS, THEN 1 TABLET FOR 3 DAYS THEN 1/2 TABLET FOR 3 DAYS, Disp: 15 tablet, Rfl: 0 .  sodium polystyrene (KAYEXALATE) 15 GM/60ML suspension, TAKE 60 MLS (15 G TOTAL) BY MOUTH ONCE FOR 1 DOSE., Disp: 60 mL, Rfl: 0 .  sodium zirconium cyclosilicate (LOKELMA) 5 g packet, TAKE 5 G BY MOUTH EVERY OTHER DAY FOR 2 DOSES., Disp: 2 each, Rfl: 0   Exam: Current vital signs: BP (!) 160/130 (BP Location: Right Arm)   Pulse (!) 1   Resp 15   SpO2 99%  Vital signs in last 24 hours: Pulse Rate:  [1] 1 (05/17 1111) Resp:  [15] 15 (05/17 1111) BP: (160)/(130) 160/130 (05/17 1111) SpO2:  [99 %] 99 % (05/17 1111)  Gen: Patient is awake alert HEENT: Normocephalic, atraumatic Lungs:  Clear Cardiovascular: Regular rate rhythm Abdomen nondistended nontender Extremities warm well perfused Neurological exam Mental status: He is awake, alert, follows all commands She is completely nonverbal He is able to sign and point to things and communicates pretty effectively without any verbal output. Cranial nerves: Pupils equal round reactive to light, extraocular movement examination: Reveals that he has some difficulty looking to the left but has no visual field deficits on the left.  Face appears symmetric.  Facial sensation intact, upon asking him to stick his tongue out he opened his mouth and was having a hard time moving his tongue but was able to smack his lips at other times without any obvious problems. Motor exam: He had  left upper and lower extremity weakness admixed with pain-almost looked effort dependent.  Right upper and lower extremity normal strength. Sensation is diminished on the left- when asked if he was feeling the same as the other side or less, he was able to point and say he was feeling less on the left side.  There was no extinction on double simultaneous stimulation. There is no coordination deficits noted NIH stroke scale 1a Level of Conscious.: 0 1b LOC Questions: 1 1c LOC Commands: 0 2 Best Gaze: 1 3 Visual: 0 4 Facial Palsy: 0 5a Motor Arm - left: 1 5b Motor Arm - Right: 0 6a Motor Leg - Left: 1 6b Motor Leg - Right: 0 7 Limb Ataxia: 0 8 Sensory: 1 9 Best Language: 2 10 Dysarthria: un 11 Extinct. and Inatten.: 0 TOTAL: 7   Labs I have reviewed labs in epic and the results pertinent to this consultation are:   CBC    Component Value Date/Time   WBC 14.0 (H) 10/28/2020 1115   RBC 5.13 10/28/2020 1115   HGB 15.3 10/28/2020 1117   HGB 15.4 11/07/2019 0918   HCT 45.0 10/28/2020 1117   HCT 46.2 11/07/2019 0918   PLT 238 10/28/2020 1115   PLT 245 11/07/2019 0918   MCV 93.2 10/28/2020 1115   MCV 90 11/07/2019 0918   MCV 92 10/21/2013 0435    MCH 29.6 10/28/2020 1115   MCHC 31.8 10/28/2020 1115   RDW 17.2 (H) 10/28/2020 1115   RDW 14.2 11/07/2019 0918   RDW 15.7 (H) 10/21/2013 0435   LYMPHSABS 3.5 10/28/2020 1115   LYMPHSABS 1.1 10/21/2013 0435   MONOABS 1.4 (H) 10/28/2020 1115   MONOABS 0.6 10/21/2013 0435   EOSABS 0.7 (H) 10/28/2020 1115   EOSABS 0.0 10/21/2013 0435   BASOSABS 0.1 10/28/2020 1115   BASOSABS 0.0 10/21/2013 0435    CMP     Component Value Date/Time   NA 141 10/28/2020 1117   NA 141 11/07/2019 0918   NA 137 10/23/2013 0514   K 4.2 10/28/2020 1117   K 4.9 10/23/2013 0514   CL 104 10/28/2020 1117   CL 104 10/23/2013 0514   CO2 28 07/09/2020 0506   CO2 27 10/23/2013 0514   GLUCOSE 80 10/28/2020 1117   GLUCOSE 115 (H) 10/23/2013 0514   BUN 19 10/28/2020 1117   BUN 35 (H) 11/07/2019 0918   BUN 39 (H) 10/23/2013 0514   CREATININE 1.40 (H) 10/28/2020 1117   CREATININE 1.97 (H) 01/14/2020 1228   CALCIUM 9.3 07/09/2020 0506   CALCIUM 9.0 10/23/2013 0514   PROT 7.1 07/03/2020 2031   PROT 6.4 11/07/2019 0918   PROT 6.7 10/20/2013 1006   ALBUMIN 3.4 (L) 07/03/2020 2031   ALBUMIN 3.8 11/07/2019 0918   ALBUMIN 3.1 (L) 10/20/2013 1006   AST 15 07/03/2020 2031   AST 34 10/20/2013 1006   ALT 20 07/03/2020 2031   ALT 69 10/20/2013 1006   ALKPHOS 77 07/03/2020 2031   ALKPHOS 90 10/20/2013 1006   BILITOT 0.7 07/03/2020 2031   BILITOT 0.3 11/07/2019 0918   BILITOT 0.6 10/20/2013 1006   GFRNONAA 35 (L) 07/09/2020 0506   GFRNONAA 37 (L) 01/14/2020 1228   GFRAA 42 (L) 01/14/2020 1228    Lipid Panel     Component Value Date/Time   CHOL 176 12/25/2019 0632   CHOL 160 11/07/2019 0918   CHOL 157 10/21/2013 0435   TRIG 91 12/25/2019 0632   TRIG 117 10/21/2013 0435   HDL  37 (L) 12/25/2019 0632   HDL 40 11/07/2019 0918   HDL 30 (L) 10/21/2013 0435   CHOLHDL 4.8 12/25/2019 0632   VLDL 18 12/25/2019 0632   VLDL 23 10/21/2013 0435   LDLCALC 121 (H) 12/25/2019 0632   LDLCALC 92 11/07/2019 0918    LDLCALC 104 (H) 10/21/2013 0435     Imaging I have reviewed the images obtained: CT-scan of the brain-aspects 10.  No bleed CTA head and neck-no emergent LVO in the head or neck. 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  Assessment:  60 year old with significant cerebrovascular risk factor history-atrial fibrillation was supposed to be on anticoagulation with Eliquis and unclear reason why he is not at this time, COPD, congestive heart failure, hypertension, depression [facility] in from jail for sudden onset of unresponsiveness and upon coming around inability to talk and left-sided weakness. Exam had a lot of functional features to it-she was unable to give full strength on the left but then also complained of pain on passive movement, was able to follow all commands and sign and mimic without verbalizing, was able to open his mouth but not able to stick his tongue out at all-making suspicious for a underlying psychogenic etiology.  Given the risk factors, irrespective I performed a stat CT head which was negative for any acute process, stat CTA head and neck which was negative for emergent LVO and I took him for a stat MRI of the brain which also on my preliminary review does not reveal an acute stroke.  No tPA due to exam being inconsistent. No evidence of large vessel occlusion on vessel imaging  Only other considerations include an unwitnessed syncope or seizure but the exam is way too inconsistent to be suggestive of postictal Todd's  phenomenology. Hypertensive urgency is the other differential but his exam is way too functional for this to be related to something systemic which should cause nonfocal symptoms.  Doubt that this is complicated migraine-has no history of migraines in the past.  Impression: -Strokelike symptoms-suspect underlying functional  etiology -Evaluate for unwitnessed syncope seizure   Recommendations: At this time, will check basic labs, EKG.  Out of abundance of caution, I would recommend getting an echocardiogram and an EEG.  If both of the above are unremarkable, no further neurological recommendations.  Blood pressure goal: Treat as hypertensive urgency-reduce by 20% every day for goal of normotension.  From a stroke prevention standpoint, he needs to be on an anticoagulant given history of atrial fibrillation-I am unclear why his anticoagulant was discontinued.  This is something that the primary team can pursue with cardiology.  Discussed with Dr. Verner Chol in the ER.  -- Amie Portland, MD Neurologist [facility] Pager: [phone]

## 2020-10-28 NOTE — H&P (Signed)
History and Physical    Chris [name] IOX:[id number] DOB: 04-16-1961 DOA: 10/28/2020  PCP: [name], MD    Patient coming from:  [place]   Chief Complaint:  Left sided weakness.    HPI: Chris [name] is a 60 y.o. male seen in ed with complaints of left sided weakness and seen by neuro and recommended that he be admitted for echocardiogram and EEG.  And also for optimal blood pressure controlled.  HPI is limited as patient is nonverbal.Pt is now speaking and reports that  Earlier he was not able to speak and last night he almost passed out and  He did not see doctor last night and today am when he was not able to get up they brought him here.  Pt has past medical history of atrial flutter, CHF, COPD, CAD, diabetes mellitus type 2, hypertension, gout, influenza A, tobacco abuse, reduced ejection fraction congestive heart failure with a EF of 25% and global hypokinesis based on echocardiogram in January 2022.  ED Course:  Vitals:   10/28/20 1122 10/28/20 1217 10/28/20 1230 10/28/20 1245  BP: (!) 169/103 (!) 168/104 (!) 177/100 (!) 189/100  Pulse:  62 69 66  Resp:  20 20 (!) 22  Temp:      TempSrc:      SpO2:  97% 96% 98%  In the emergency room patient is alert awake, afebrile hypertensive and initial head CT negative for acute intracranial abnormality, MRI of the brain without evidence of an acute infarct. Currently denies any chest pain or any other complaints.  He is showing creatinine of 1.46 which is improved from prior blood work in January this year which was 2.14, otherwise normal with normal LFTs, CBC shows an elevated white count of 14, with an RDW of 17.2, platelets of 238, with a hemoglobin of 15.2.  Stat CT head negative for any acute process, CT angio with no evidence of large vessel occlusion, stat MRI preliminary read was negative for stroke.  Review of Systems:  Review of Systems  Cardiovascular: Positive for chest pain.  Neurological: Positive for  weakness.  All other systems reviewed and are negative.  Past Medical History:  Diagnosis Date  . Arrhythmia    atrial flutter  . CHF (congestive heart failure) ([place])   . Chronic kidney disease   . COPD (chronic obstructive pulmonary disease) ([place])   . Coronary artery disease   . Depression   . Diabetes mellitus without complication ([place])   . GERD (gastroesophageal reflux disease)   . Gout   . Hypertension   . Influenza A with respiratory manifestations   . Mental disorder     Past Surgical History:  Procedure Laterality Date  . ANKLE SURGERY    . CARDIAC CATHETERIZATION    . CARDIOVERSION N/A 07/08/2020   Procedure: CARDIOVERSION;  Surgeon: Corey Skains, MD;  Location: [facility] ORS;  Service: Cardiovascular;  Laterality: N/A;  . HERNIA REPAIR     x2  . SHOULDER SURGERY    . TEE WITHOUT CARDIOVERSION N/A 07/08/2020   Procedure: TRANSESOPHAGEAL ECHOCARDIOGRAM (TEE);  Surgeon: Corey Skains, MD;  Location: [facility] ORS;  Service: Cardiovascular;  Laterality: N/A;    reports that he has been smoking cigarettes. He has a 20.00 pack-year smoking history. He has never used smokeless tobacco. He reports current drug use. Frequency: 21.00 times per week. Drugs: Marijuana and Cocaine. He reports that he does not drink alcohol.  No Known Allergies Family History  Problem Relation Age  of Onset  . Heart disease Father   . Diabetes Mother   . HIV Brother   . Healthy Son   . Healthy Daughter    Prior to Admission medications   Medication Sig Start Date End Date Taking? Authorizing Provider  Accu-Chek Softclix Lancets lancets USE AS INSTRUCTED. INJECT INTO THE SKIN ONCE DAILY Patient taking differently: USE AS INSTRUCTED. INJECT INTO THE SKIN ONCE DAILY 11/13/19 11/12/20  [name], NP  albuterol (PROVENTIL) (2.5 MG/3ML) 0.083% nebulizer solution TAKE 3 MLS BY NEBULIZATION EVERY 6 (SIX) HOURS AS NEEDED FOR SHORTNESS OF BREATH. 04/17/20 04/17/21  [name], MD  albuterol  (VENTOLIN HFA) 108 (90 Base) MCG/ACT inhaler INHALE 2 PUFFS INTO THE LUNGS EVERY 6 (SIX) HOURS AS NEEDED FOR WHEEZING OR SHORTNESS OF BREATH. 04/17/20 04/17/21  [name], MD  allopurinol (ZYLOPRIM) 100 MG tablet TAKE 2 TABLETS (200 MG TOTAL) BY MOUTH DAILY. 07/01/20 07/01/21  [name], MD  apixaban (ELIQUIS) 5 MG TABS tablet TAKE 1 TABLET (5 MG TOTAL) BY MOUTH 2 (TWO) TIMES DAILY. 07/10/20 07/10/21  [name], MD  aspirin 81 MG EC tablet TAKE 1 TABLET (81 MG TOTAL) BY MOUTH DAILY. 07/01/20 07/01/21  [name], MD  atorvastatin (LIPITOR) 40 MG tablet TAKE 1 TABLET (40 MG TOTAL) BY MOUTH DAILY AT 6 PM. Patient taking differently: Take by mouth daily. at 6pm 07/01/20 07/01/21  [name], MD  budesonide-formoterol (SYMBICORT) 160-4.5 MCG/ACT inhaler INHALE 2 PUFFS INTO THE LUNGS 2 (TWO) TIMES DAILY. 04/17/20 04/17/21  [name], MD  carvedilol (COREG) 25 MG tablet TAKE 1 TABLET (25 MG TOTAL) BY MOUTH 2 (TWO) TIMES DAILY. 07/11/20 07/11/21  [name], MD  colchicine 0.6 MG tablet Take 2 tabs (1.2 mg) at the onset of a gout flare, may repeat 1 tab (0.6 mg) after 2 hours if symptoms persist. 10/10/20   Charlott Rakes, MD  dapagliflozin propanediol (FARXIGA) 10 MG TABS tablet Take 10 mg by mouth 1 (one) time each day 10/09/20     diclofenac Sodium (VOLTAREN) 1 % GEL APPLY 2 G TOPICALLY 4 (FOUR) TIMES DAILY. USE ON ELBOW PAIN 05/05/20 05/05/21  [name], MD  fluticasone [facility]) 50 MCG/ACT nasal spray Place 1 spray into both nostrils daily as needed for allergies or rhinitis. 05/06/20   Swords, Darrick Penna, MD  furosemide (LASIX) 40 MG tablet TAKE 1 TABLET (40 MG TOTAL) BY MOUTH DAILY. 07/10/20 07/10/21  [name], MD  glipiZIDE (GLUCOTROL) 5 MG tablet TAKE 0.5 TABLETS (2.5 MG TOTAL) BY MOUTH 2 (TWO) TIMES DAILY BEFORE A MEAL. 07/01/20 07/01/21  [name], MD  isosorbide-hydrALAZINE (BIDIL) 20-37.5 MG tablet TAKE 1 TABLET BY MOUTH 3 (THREE) TIMES  DAILY. 07/01/20 07/01/21  [name], MD  montelukast (SINGULAIR) 10 MG tablet TAKE 1 TABLET (10 MG TOTAL) BY MOUTH AT BEDTIME. 07/01/20 07/01/21  [name], MD  predniSONE (DELTASONE) 20 MG tablet TAKE 2 TABLETS BY MOUTH DAILY FOR 3 DAYS, THEN 1.5 TABLETS FOR 3 DAYS, THEN 1 TABLET FOR 3 DAYS THEN 1/2 TABLET FOR 3 DAYS 07/25/20 07/25/21  [name], MD  sodium polystyrene (KAYEXALATE) 15 GM/60ML suspension TAKE 60 MLS (15 G TOTAL) BY MOUTH ONCE FOR 1 DOSE. 07/10/20 07/10/21  [name], MD  sodium zirconium cyclosilicate (LOKELMA) 5 g packet TAKE 5 G BY MOUTH EVERY OTHER DAY FOR 2 DOSES. 07/09/20 07/09/21  [name], MD  diltiazem (CARDIZEM CD) 180 MG 24 hr capsule  Take 1 capsule (180 mg total) by mouth daily. 07/10/20 07/11/20  [name], MD  metoprolol tartrate (LOPRESSOR) 100 MG tablet Take 1 tablet (100 mg total) by mouth 2 (two) times daily. 07/09/20 07/11/20  [name], MD   Physical Exam: Vitals:   10/28/20 1122 10/28/20 1217 10/28/20 1230 10/28/20 1245  BP: (!) 169/103 (!) 168/104 (!) 177/100 (!) 189/100  Pulse:  62 69 66  Resp:  20 20 (!) 22  Temp:      TempSrc:      SpO2:  97% 96% 98%   Physical Exam Vitals and nursing note reviewed.  Constitutional:      General: He is not in acute distress.    Appearance: Normal appearance. He is not ill-appearing, toxic-appearing or diaphoretic.  HENT:     Head: Normocephalic and atraumatic.     Right Ear: External ear normal.     Left Ear: External ear normal.     Nose: Nose normal.     Mouth/Throat:     Mouth: Mucous membranes are moist.  Eyes:     Extraocular Movements: Extraocular movements intact.     Pupils: Pupils are equal, round, and reactive to light.  Cardiovascular:     Rate and Rhythm: Normal rate and regular rhythm.     Pulses: Normal pulses.     Heart sounds: Normal heart sounds. No friction rub.  Pulmonary:     Effort: Pulmonary effort is normal.     Breath sounds: Normal breath sounds.   Abdominal:     General: Bowel sounds are normal. There is no distension.     Palpations: Abdomen is soft.     Tenderness: There is no abdominal tenderness. There is no guarding.  Genitourinary:    Rectum: Guaiac result negative.  Musculoskeletal:     Right lower leg: No edema.     Left lower leg: No edema.  Skin:    General: Skin is warm.     Capillary Refill: Capillary refill takes less than 2 seconds.  Neurological:     General: No focal deficit present.     Mental Status: He is oriented to person, place, and time.     Cranial Nerves: Cranial nerves are intact. No cranial nerve deficit or dysarthria.     Motor: Weakness present. No atrophy.     Coordination: Coordination is intact.     Deep Tendon Reflexes: Reflexes are normal and symmetric.     Comments: LUE and LLE weakness with decreased grip strength. Left facial droop.   Psychiatric:        Attention and Perception: Attention normal.        Mood and Affect: Mood normal.        Speech: Speech normal.        Behavior: Behavior normal. Behavior is cooperative.    Labs on Admission: I have personally reviewed following labs and imaging studies  No results for input(s): CKTOTAL, CKMB, TROPONINI in the last 72 hours. Lab Results  Component Value Date   WBC 14.0 (H) 10/28/2020   HGB 15.3 10/28/2020   HCT 45.0 10/28/2020   MCV 93.2 10/28/2020   PLT 238 10/28/2020    Recent Labs  Lab 10/28/20 1115 10/28/20 1117  NA 140 141  K 4.3 4.2  CL 103 104  CO2 27  --   BUN 16 19  CREATININE 1.46* 1.40*  CALCIUM 9.4  --   PROT 7.5  --   BILITOT 0.9  --   [facility]  92  --   ALT 26  --   AST 24  --   GLUCOSE 80 80   Lab Results  Component Value Date   CHOL 176 12/25/2019   HDL 37 (L) 12/25/2019   LDLCALC 121 (H) 12/25/2019   TRIG 91 12/25/2019   Urinalysis    Component Value Date/Time   COLORURINE YELLOW (A) 07/03/2020 2032   APPEARANCEUR CLEAR (A) 07/03/2020 2032   APPEARANCEUR Clear 11/30/2018 1035   LABSPEC  1.011 07/03/2020 2032   PHURINE 6.0 07/03/2020 2032   GLUCOSEU NEGATIVE 07/03/2020 2032   HGBUR SMALL (A) 07/03/2020 2032   BILIRUBINUR NEGATIVE 07/03/2020 2032   BILIRUBINUR Negative 11/30/2018 [place] 07/03/2020 2032   PROTEINUR 100 (A) 07/03/2020 2032   NITRITE NEGATIVE 07/03/2020 2032   LEUKOCYTESUR NEGATIVE 07/03/2020 2032   COVID-19 Labs No results for input(s): DDIMER, FERRITIN, LDH, CRP in the last 72 hours. Lab Results  Component Value Date   [place] NEGATIVE 07/03/2020   [place] NEGATIVE 06/16/2020   [place] NEGATIVE 05/15/2020   [place] NEGATIVE 05/04/2020    Radiological Exams on Admission: MR BRAIN WO CONTRAST  Result Date: 10/28/2020 CLINICAL DATA:  Stroke follow-up. EXAM: MRI HEAD WITHOUT CONTRAST TECHNIQUE: Multiplanar, multiecho pulse sequences of the brain and surrounding structures were obtained without intravenous contrast. Only DWI, FLAIR and GRE were performed per provider request. COMPARISON:  None. FINDINGS: Limited evaluation due to abbreviated protocol. Within this limitation: Brain: No acute infarction, evidence of hemorrhage, hydrocephalus, extra-axial collection or mass lesion. Vascular: Evaluated on same day CTA. Sinuses/Orbits: Clear sinuses.  Unremarkable orbits Other: No sizable mastoid effusions. IMPRESSION: Limited/abbreviated MRI (as detailed above) without evidence of acute infarct. Electronically Signed   By: Margaretha Sheffield MD   On: 10/28/2020 12:23   CT HEAD CODE STROKE WO CONTRAST  Result Date: 10/28/2020 CLINICAL DATA:  Code stroke. Neuro deficit, acute stroke suspected. Right-sided weakness. EXAM: CT HEAD WITHOUT CONTRAST TECHNIQUE: Contiguous axial images were obtained from the base of the skull through the vertex without intravenous contrast. COMPARISON:  None. FINDINGS: Brain: No evidence of acute large vascular territory infarction, hemorrhage, hydrocephalus, extra-axial collection or mass lesion/mass  effect. Mild patchy white matter hypodensities, most likely related to chronic microvascular ischemic disease. Partially empty and expanded sella. Vascular: Calcific atherosclerosis. No definite hyperdense vessel identified. Skull: No acute fracture. Sinuses/Orbits: Clear sinuses. Bilateral proptosis with prominent retro bulbar fat. Otherwise, unremarkable orbits. Other: No mastoid effusions. ASPECTS [facility] Stroke Program Early CT Score) Total score (0-10 with 10 being normal): 10 IMPRESSION: 1. No evidence of acute intracranial abnormality. 2. ASPECTS is 3. Partially empty and expanded sella. This finding is nonspecific and may be incidental, but can be seen with idiopathic intracranial hypertension in the correct clinical setting. Code stroke imaging results were communicated on 10/28/2020 at 11:31 am to provider Dr. Rory Percy via secure text paging. Electronically Signed   By: Margaretha Sheffield MD   On: 10/28/2020 11:32   CT ANGIO HEAD NECK W WO CM W PERF (CODE STROKE)  Result Date: 10/28/2020 CLINICAL DATA:  Concern for stroke. EXAM: CT ANGIOGRAPHY HEAD AND NECK CT PERFUSION BRAIN TECHNIQUE: Multidetector CT imaging of the head and neck was performed using the standard protocol during bolus administration of intravenous contrast. Multiplanar CT image reconstructions and MIPs were obtained to evaluate the vascular anatomy. Carotid stenosis measurements (when applicable) are obtained utilizing NASCET criteria, using the distal internal carotid diameter as the denominator. Multiphase CT imaging of the brain was performed following  IV bolus contrast injection. Subsequent parametric perfusion maps were calculated using RAPID software. CONTRAST:  179mL OMNIPAQUE IOHEXOL 350 MG/ML SOLN COMPARISON:  None. FINDINGS: CTA NECK FINDINGS Aortic arch: The aorta and great vessel origins are not imaged. Right carotid system: No evidence of dissection, stenosis (50% or greater) or occlusion. Left carotid system: The left common  carotid artery origin is not imaged. Within this limitation, no evidence of dissection, stenosis (50% or greater), or occlusion. Mild calcific atherosclerosis at the carotid bifurcation. Vertebral arteries: Left dominant. No evidence of dissection, stenosis (50% or greater) or occlusion. Skeleton: Mild multilevel degenerative change. Other neck: No evidence of acute abnormality. Mildly prominent nonspecific bilateral submandibular and submental lymph nodes. Upper chest: Visualized lung apices are clear. Review of the MIP images confirms the above findings CTA HEAD FINDINGS Anterior circulation: No large vessel occlusion, proximal hemodynamically significant stenosis. Approximately 2 mm inferomedially directed outpouching arising from the left distal cavernous internal carotid artery (see series 9, image 108; series 10, image 122 and series 11, image 115) concerning for aneurysm. Posterior circulation: No large vessel occlusion, proximal hemodynamically significant stenosis, or aneurysm. Venous sinuses: As permitted by contrast timing, patent. CT Brain Perfusion Findings: ASPECTS: 10. CBF (<30%) Volume: 76mL Perfusion (Tmax>6.0s) volume: 55mL Mismatch Volume: 64mL Infarction Location:None identified. IMPRESSION: CTA head: 1. No emergent large vessel occlusion or proximal hemodynamically significant stenosis. 2. 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 3. Nonspecific mildly prominent bilateral submandibular and submental lymph nodes. Recommend clinical follow-up. CTA Neck: Within the limitations detailed above, no visible hemodynamically significant stenosis in the neck. Perfusion: No evidence of penumbra or core infarct. Preliminary critical findings discussed with Dr. Rory Percy via telephone at 11:32 a.m. Electronically Signed   By: Margaretha Sheffield MD   On: 10/28/2020 11:55    EKG: Independently reviewed.  Sinus rhythm 61 with T wave inversions in lead  to 3 aVF, V5 V6.  Echocardiogram 07/08/2020: Left ventricular ejection fraction, by estimation, is 20 to 25%. The left ventricle has severely decreased function. The left ventricle demonstrates global hypokinesis. The left ventricular internal cavity size was moderately dilated. Left ventricular diastolic parameters were normal. 2. Right ventricular systolic function is normal. The right ventricular size is normal. 3. Left atrial size was moderately dilated. No left atrial/left atrial appendage thrombus was detected. 4. Right atrial size was moderately dilated. 5. The mitral valve is normal in structure. Moderate mitral valve regurgitation. 6. Tricuspid valve regurgitation is moderate. 7. The aortic valve is normal in structure. Aortic valve regurgitation is not visualized. 8. There is Moderate (Grade III) plaque. 9. Agitated saline contrast bubble study was negative, with no evidence of any interatrial shunt.   Assessment/Plan Active Problems:   * No active hospital problems. *      DVT prophylaxis:  Heparin   Code Status:  Full code   Family Communication:  Gwenlyn Perking (Sister)  [phone] (Mobile)  Disposition Plan:  TBD  Consults called:  Neuro   Admission status: Inpatient    Para Skeans MD [facility] [phone] How to contact the [facility] Attending or Consulting provider [place] or covering provider during after hours [place], for this patient.    1. Check the care team in [facility] and look for a) attending/consulting [place] provider listed and b) the [facility] team listed 2. Log into [email or web address] and use [place]'s universal password to access. If you do not have the password, please contact the hospital operator. 3.  Locate the [facility] provider you are looking for under [facility] and page to a number that you can be directly reached. 4. If you still have difficulty reaching the provider, please page the [facility] (Director on Call) for the Hospitalists  listed on amion for assistance. [email or web address] Password [email or web address] 10/28/2020, 1:15 PM

## 2020-10-28 NOTE — ED Notes (Addendum)
Arrived in [facility] 1111 as a Code stroke with symptoms of aphasia, left facial droop, right gaze left sided weakness and NIHS of 14. CT and MRI done at this time. Code stroke was cancelled at 1155 AM. Able to follow commands. Continuous cardiac monitoring initiated.

## 2020-10-28 NOTE — ED Provider Notes (Addendum)
[facility] EMERGENCY DEPARTMENT Provider Note   CSN: [id number] Arrival date & time: 10/28/20  1111   LEVEL 5 CAVEAT - APHASIA History Chief Complaint  Patient presents with  . Code Stroke    Chris [name] is a 60 y.o. male.  HPI 60 year old male presents as a code stroke.  History is limited as the patient is not speaking.  History is primarily from EMS.  Was last seen normal at 7 AM.  They do rounds every 15 minutes and found him to have left-sided weakness and trouble speaking.  Further history is unavailable.  Past Medical History:  Diagnosis Date  . Arrhythmia    atrial flutter  . CHF (congestive heart failure) ([place])   . Chronic kidney disease   . COPD (chronic obstructive pulmonary disease) ([place])   . Coronary artery disease   . Depression   . Diabetes mellitus without complication ([place])   . GERD (gastroesophageal reflux disease)   . Gout   . Hypertension   . Influenza A with respiratory manifestations   . Mental disorder     Patient Active Problem List   Diagnosis Date Noted  . COPD exacerbation ([place])   . Typical atrial flutter ([place])   . Acute respiratory failure with hypoxia ([place]) 07/05/2020  . CHF (congestive heart failure) ([place]) 07/04/2020  . Acute exacerbation of CHF (congestive heart failure) ([place]) 06/16/2020  . COPD with acute exacerbation ([place]) 06/16/2020  . Influenza vaccine refused 05/06/2020  . Acute on chronic combined systolic (congestive) and diastolic (congestive) heart failure ([place]) 05/05/2020  . Acute decompensated heart failure ([place]) 05/04/2020  . Illiteracy 05/04/2020  . Chronic respiratory failure with hypoxia, on home oxygen therapy ([place]) 12/28/2019  . Type 2 diabetes mellitus with stage 3 chronic kidney disease ([place]) 12/25/2019  . Acute and chronic respiratory failure (acute-on-chronic) ([place]) 12/25/2019  . Acute on chronic combined systolic and diastolic CHF (congestive heart failure) ([place]) 10/26/2019  . Elevated  troponin I level 10/26/2019  . Acute on chronic diastolic (congestive) heart failure ([place]) 10/26/2019  . History of gout 02/01/2019  . Seasonal allergic rhinitis due to pollen 02/01/2019  . Tobacco dependence 11/30/2018  . Microscopic hematuria 11/30/2018  . Depression 11/30/2018  . Difficulty controlling anger 11/30/2018  . CAP (community acquired pneumonia) 08/11/2018  . COPD (chronic obstructive pulmonary disease) ([place])   . CKD (chronic kidney disease) stage 3, GFR 30-59 ml/min (H[facility]) 08/10/2018  . Recurrent epistaxis 04/21/2018  . Mixed hyperlipidemia 07/28/2017  . Essential hypertension 07/28/2017  . Chronic systolic heart failure ([place]) 10/25/2014  . Cocaine abuse ([place]) 02/20/2013  . Cannabis abuse 02/20/2013  . Back pain, chronic 02/20/2013    Past Surgical History:  Procedure Laterality Date  . ANKLE SURGERY    . CARDIAC CATHETERIZATION    . CARDIOVERSION N/A 07/08/2020   Procedure: CARDIOVERSION;  Surgeon: Corey Skains, MD;  Location: [facility] ORS;  Service: Cardiovascular;  Laterality: N/A;  . HERNIA REPAIR     x2  . SHOULDER SURGERY    . TEE WITHOUT CARDIOVERSION N/A 07/08/2020   Procedure: TRANSESOPHAGEAL ECHOCARDIOGRAM (TEE);  Surgeon: Corey Skains, MD;  Location: [facility] ORS;  Service: Cardiovascular;  Laterality: N/A;       Family History  Problem Relation Age of Onset  . Heart disease Father   . Diabetes Mother   . HIV Brother   . Healthy Son   . Healthy Daughter     Social History   Tobacco Use  . Smoking status:  Current Every Day Smoker    Packs/day: 1.00    Years: 20.00    Pack years: 20.00    Types: Cigarettes  . Smokeless tobacco: Never Used  . Tobacco comment: less than 1 pack per day  Vaping Use  . Vaping Use: Never used  Substance Use Topics  . Alcohol use: No  . Drug use: Yes    Frequency: 21.0 times per week    Types: Marijuana, Cocaine    Comment: no longer- Cocaine    Home Medications Prior to Admission medications    Medication Sig Start Date End Date Taking? Authorizing Provider  Accu-Chek Softclix Lancets lancets USE AS INSTRUCTED. INJECT INTO THE SKIN ONCE DAILY Patient taking differently: USE AS INSTRUCTED. INJECT INTO THE SKIN ONCE DAILY 11/13/19 11/12/20  [name], NP  albuterol (PROVENTIL) (2.5 MG/3ML) 0.083% nebulizer solution TAKE 3 MLS BY NEBULIZATION EVERY 6 (SIX) HOURS AS NEEDED FOR SHORTNESS OF BREATH. 04/17/20 04/17/21  [name], MD  albuterol (VENTOLIN HFA) 108 (90 Base) MCG/ACT inhaler INHALE 2 PUFFS INTO THE LUNGS EVERY 6 (SIX) HOURS AS NEEDED FOR WHEEZING OR SHORTNESS OF BREATH. 04/17/20 04/17/21  [name], MD  allopurinol (ZYLOPRIM) 100 MG tablet TAKE 2 TABLETS (200 MG TOTAL) BY MOUTH DAILY. 07/01/20 07/01/21  [name], MD  apixaban (ELIQUIS) 5 MG TABS tablet TAKE 1 TABLET (5 MG TOTAL) BY MOUTH 2 (TWO) TIMES DAILY. 07/10/20 07/10/21  [name], MD  aspirin 81 MG EC tablet TAKE 1 TABLET (81 MG TOTAL) BY MOUTH DAILY. 07/01/20 07/01/21  [name], MD  atorvastatin (LIPITOR) 40 MG tablet TAKE 1 TABLET (40 MG TOTAL) BY MOUTH DAILY AT 6 PM. Patient taking differently: Take by mouth daily. at 6pm 07/01/20 07/01/21  [name], MD  budesonide-formoterol (SYMBICORT) 160-4.5 MCG/ACT inhaler INHALE 2 PUFFS INTO THE LUNGS 2 (TWO) TIMES DAILY. 04/17/20 04/17/21  [name], MD  carvedilol (COREG) 25 MG tablet TAKE 1 TABLET (25 MG TOTAL) BY MOUTH 2 (TWO) TIMES DAILY. 07/11/20 07/11/21  [name], MD  colchicine 0.6 MG tablet Take 2 tabs (1.2 mg) at the onset of a gout flare, may repeat 1 tab (0.6 mg) after 2 hours if symptoms persist. 10/10/20   Charlott Rakes, MD  dapagliflozin propanediol (FARXIGA) 10 MG TABS tablet Take 10 mg by mouth 1 (one) time each day 10/09/20     diclofenac Sodium (VOLTAREN) 1 % GEL APPLY 2 G TOPICALLY 4 (FOUR) TIMES DAILY. USE ON ELBOW PAIN 05/05/20 05/05/21  [name], MD  fluticasone [facility]) 50 MCG/ACT nasal spray  Place 1 spray into both nostrils daily as needed for allergies or rhinitis. 05/06/20   Swords, Darrick Penna, MD  furosemide (LASIX) 40 MG tablet TAKE 1 TABLET (40 MG TOTAL) BY MOUTH DAILY. 07/10/20 07/10/21  [name], MD  glipiZIDE (GLUCOTROL) 5 MG tablet TAKE 0.5 TABLETS (2.5 MG TOTAL) BY MOUTH 2 (TWO) TIMES DAILY BEFORE A MEAL. 07/01/20 07/01/21  [name], MD  isosorbide-hydrALAZINE (BIDIL) 20-37.5 MG tablet TAKE 1 TABLET BY MOUTH 3 (THREE) TIMES DAILY. 07/01/20 07/01/21  [name], MD  montelukast (SINGULAIR) 10 MG tablet TAKE 1 TABLET (10 MG TOTAL) BY MOUTH AT BEDTIME. 07/01/20 07/01/21  [name], MD  predniSONE (DELTASONE) 20 MG tablet TAKE 2 TABLETS BY MOUTH DAILY FOR 3 DAYS, THEN 1.5 TABLETS FOR 3 DAYS, THEN 1 TABLET FOR 3 DAYS THEN 1/2 TABLET FOR 3 DAYS 07/25/20 07/25/21  [name], MD  sodium polystyrene (KAYEXALATE) 15 GM/60ML suspension TAKE 60 MLS (15 G TOTAL) BY MOUTH ONCE FOR 1 DOSE. 07/10/20 07/10/21  [name], MD  sodium zirconium cyclosilicate (LOKELMA) 5 g packet TAKE 5 G BY MOUTH EVERY OTHER DAY FOR 2 DOSES. 07/09/20 07/09/21  [name], MD  diltiazem (CARDIZEM CD) 180 MG 24 hr capsule Take 1 capsule (180 mg total) by mouth daily. 07/10/20 07/11/20  [name], MD  metoprolol tartrate (LOPRESSOR) 100 MG tablet Take 1 tablet (100 mg total) by mouth 2 (two) times daily. 07/09/20 07/11/20  [name], MD    Allergies    Patient has no known allergies.  Review of Systems   Review of Systems  Unable to perform ROS: Patient nonverbal    Physical Exam Updated Vital Signs BP (!) 189/100   Pulse 66   Temp 98.2 F (36.8 C) (Oral)   Resp (!) 22   SpO2 98%   Physical Exam Vitals and nursing note reviewed.  Constitutional:      Appearance: He is well-developed. He is not diaphoretic.  HENT:     Head: Normocephalic and atraumatic.      Right Ear: External ear normal.     Left Ear: External ear normal.     Nose: Nose normal.  Eyes:      General:        Right eye: No discharge.        Left eye: No discharge.     Extraocular Movements: Extraocular movements intact.     Pupils: Pupils are equal, round, and reactive to light.  Cardiovascular:     Rate and Rhythm: Normal rate and regular rhythm.     Heart sounds: Normal heart sounds.  Pulmonary:     Effort: Pulmonary effort is normal.     Breath sounds: Normal breath sounds.  Abdominal:     Palpations: Abdomen is soft.     Tenderness: There is no abdominal tenderness.  Musculoskeletal:     Cervical back: Neck supple. No rigidity.  Skin:    General: Skin is warm and dry.  Neurological:     Mental Status: He is alert.     Comments: Awake, alert, but does not speak. Seems to understand. Normal strength 5/5 RUE, RLE. Can barely lift LUE, LLE off stretcher, though does not immediately fall back to stretcher when let go  Psychiatric:        Mood and Affect: Mood is not anxious.     ED Results / Procedures / Treatments   Labs (all labs ordered are listed, but only abnormal results are displayed) Labs Reviewed  CBC - Abnormal; Notable for the following components:      Result Value   WBC 14.0 (*)    RDW 17.2 (*)    All other components within normal limits  DIFFERENTIAL - Abnormal; Notable for the following components:   Neutro Abs 8.3 (*)    Monocytes Absolute 1.4 (*)    Eosinophils Absolute 0.7 (*)    Abs Immature Granulocytes 0.12 (*)    All other components within normal limits  COMPREHENSIVE METABOLIC PANEL - Abnormal; Notable for the following components:   Creatinine, Ser 1.46 (*)    GFR, Estimated 55 (*)    All other components within normal limits  I-STAT CHEM 8, ED - Abnormal; Notable for the following components:   Creatinine, Ser 1.40 (*)    All other components within normal limits  SARS CORONAVIRUS 2 (TAT 6-24 HRS)  PROTIME-INR  APTT  CBG MONITORING, ED    EKG EKG Interpretation  Date/Time:  Tuesday Oct 28 2020 12:16:34 EDT Ventricular  Rate:  61 PR Interval:  151 QRS Duration: 111 QT Interval:  461 QTC Calculation: 465 R Axis:   18 Text Interpretation: Sinus rhythm Borderline repolarization abnormality Confirmed by Sherwood Gambler [id number]) on 10/28/2020 12:48:25 PM   Radiology MR BRAIN WO CONTRAST  Result Date: 10/28/2020 CLINICAL DATA:  Stroke follow-up. EXAM: MRI HEAD WITHOUT CONTRAST TECHNIQUE: Multiplanar, multiecho pulse sequences of the brain and surrounding structures were obtained without intravenous contrast. Only DWI, FLAIR and GRE were performed per provider request. COMPARISON:  None. FINDINGS: Limited evaluation due to abbreviated protocol. Within this limitation: Brain: No acute infarction, evidence of hemorrhage, hydrocephalus, extra-axial collection or mass lesion. Vascular: Evaluated on same day CTA. Sinuses/Orbits: Clear sinuses.  Unremarkable orbits Other: No sizable mastoid effusions. IMPRESSION: Limited/abbreviated MRI (as detailed above) without evidence of acute infarct. Electronically Signed   By: Margaretha Sheffield MD   On: 10/28/2020 12:23   CT HEAD CODE STROKE WO CONTRAST  Result Date: 10/28/2020 CLINICAL DATA:  Code stroke. Neuro deficit, acute stroke suspected. Right-sided weakness. EXAM: CT HEAD WITHOUT CONTRAST TECHNIQUE: Contiguous axial images were obtained from the base of the skull through the vertex without intravenous contrast. COMPARISON:  None. FINDINGS: Brain: No evidence of acute large vascular territory infarction, hemorrhage, hydrocephalus, extra-axial collection or mass lesion/mass effect. Mild patchy white matter hypodensities, most likely related to chronic microvascular ischemic disease. Partially empty and expanded sella. Vascular: Calcific atherosclerosis. No definite hyperdense vessel identified. Skull: No acute fracture. Sinuses/Orbits: Clear sinuses. Bilateral proptosis with prominent retro bulbar fat. Otherwise, unremarkable orbits. Other: No mastoid effusions. ASPECTS [facility]  Stroke Program Early CT Score) Total score (0-10 with 10 being normal): 10 IMPRESSION: 1. No evidence of acute intracranial abnormality. 2. ASPECTS is 3. Partially empty and expanded sella. This finding is nonspecific and may be incidental, but can be seen with idiopathic intracranial hypertension in the correct clinical setting. Code stroke imaging results were communicated on 10/28/2020 at 11:31 am to provider Dr. Rory Percy via secure text paging. Electronically Signed   By: Margaretha Sheffield MD   On: 10/28/2020 11:32   CT ANGIO HEAD NECK W WO CM W PERF (CODE STROKE)  Result Date: 10/28/2020 CLINICAL DATA:  Concern for stroke. EXAM: CT ANGIOGRAPHY HEAD AND NECK CT PERFUSION BRAIN TECHNIQUE: Multidetector CT imaging of the head and neck was performed using the standard protocol during bolus administration of intravenous contrast. Multiplanar CT image reconstructions and MIPs were obtained to evaluate the vascular anatomy. Carotid stenosis measurements (when applicable) are obtained utilizing NASCET criteria, using the distal internal carotid diameter as the denominator. Multiphase CT imaging of the brain was performed following IV bolus contrast injection. Subsequent parametric perfusion maps were calculated using RAPID software. CONTRAST:  143mL OMNIPAQUE IOHEXOL 350 MG/ML SOLN COMPARISON:  None. FINDINGS: CTA NECK FINDINGS Aortic arch: The aorta and great vessel origins are not imaged. Right carotid system: No evidence of dissection, stenosis (50% or greater) or occlusion. Left carotid system: The left common carotid artery origin is not imaged. Within this limitation, no evidence of dissection, stenosis (50% or greater), or occlusion. Mild calcific atherosclerosis at the carotid bifurcation. Vertebral arteries: Left dominant. No evidence of dissection, stenosis (50% or greater) or occlusion. Skeleton: Mild multilevel degenerative change. Other neck: No evidence of acute abnormality. Mildly prominent nonspecific  bilateral submandibular and submental lymph nodes. Upper chest: Visualized lung apices are clear. Review of the MIP  images confirms the above findings CTA HEAD FINDINGS Anterior circulation: No large vessel occlusion, proximal hemodynamically significant stenosis. Approximately 2 mm inferomedially directed outpouching arising from the left distal cavernous internal carotid artery (see series 9, image 108; series 10, image 122 and series 11, image 115) concerning for aneurysm. Posterior circulation: No large vessel occlusion, proximal hemodynamically significant stenosis, or aneurysm. Venous sinuses: As permitted by contrast timing, patent. CT Brain Perfusion Findings: ASPECTS: 10. CBF (<30%) Volume: 41mL Perfusion (Tmax>6.0s) volume: 59mL Mismatch Volume: 56mL Infarction Location:None identified. IMPRESSION: CTA head: 1. No emergent large vessel occlusion or proximal hemodynamically significant stenosis. 2. 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 3. Nonspecific mildly prominent bilateral submandibular and submental lymph nodes. Recommend clinical follow-up. CTA Neck: Within the limitations detailed above, no visible hemodynamically significant stenosis in the neck. Perfusion: No evidence of penumbra or core infarct. Preliminary critical findings discussed with Dr. Rory Percy via telephone at 11:32 a.m. Electronically Signed   By: Margaretha Sheffield MD   On: 10/28/2020 11:55    Procedures Procedures   Medications Ordered in ED Medications  sodium chloride flush (NS) 0.9 % injection 3 mL (3 mLs Intravenous Not Given 10/28/20 1304)  sulfamethoxazole-trimethoprim (BACTRIM DS) 800-160 MG per tablet 1 tablet (has no administration in time range)  acetaminophen (TYLENOL) tablet 1,000 mg (has no administration in time range)  iohexol (OMNIPAQUE) 350 MG/ML injection 100 mL (100 mLs Intravenous Contrast Given 10/28/20 1139)    ED Course  I have reviewed the  triage vital signs and the nursing notes.  Pertinent labs & imaging results that were available during my care of the patient were reviewed by me and considered in my medical decision making (see chart for details).    MDM Rules/Calculators/A&P                          Patient presents as a code stroke.  However his exam is a little inconsistent.  It seems like he can receive speech but not produce speech.  The left-sided weakness is also inconsistent.  Neurology elected not to do tPA.  There is no large vessel occlusion or obvious stroke on MRI.  Patient does seem to be improving a little bit and is talking to me a little more.  However neuro is recommending observation with EEG, echo, subacute blood pressure control.  Patient is able to tell me that he has been having a headache to the back of his head for the last 2 days and he does seem to have an mildly raised red area.  I put an ultrasound on this and does not show an obvious abscess but given how tender it is I will treat him as if it is a developing abscess with Bactrim and warm compresses.  Will admit to hospitalist service, discussed with Dr. Posey Pronto Final Clinical Impression(s) / ED Diagnoses Final diagnoses:  Left-sided weakness  Abscess or cellulitis of scalp    Rx / DC Orders ED Discharge Orders    None       Sherwood Gambler, MD 10/28/20 1318    Sherwood Gambler, MD 10/28/20 1319

## 2020-10-28 NOTE — ED Triage Notes (Signed)
Arrived in ED at 1111 for Code stroke with aphasia, left sided facial droop and left sided weakness. [facility] [place] 0700AM.

## 2020-10-28 NOTE — Procedures (Signed)
Patient Name: Chris [name]  MRN: [id number]  Epilepsy Attending: Lora Havens  Referring Physician/Provider: Dr Amie [place] Date: 10/28/2020 Duration: 25.01 mins  Patient history: 60 year old male with sudden onset of unresponsiveness and upon coming around inability to talk and left-sided weakness.  EEG to evaluate for seizures.  Level of alertness: Awake  AEDs during EEG study: None  Technical aspects: This EEG study was done with scalp electrodes positioned according to the 10-20 International system of electrode placement. Electrical activity was acquired at a sampling rate of 500Hz  and reviewed with a high frequency filter of 70Hz  and a low frequency filter of 1Hz . EEG data were recorded continuously and digitally stored.   Description: The posterior dominant rhythm consists of 9 Hz activity of moderate voltage (25-35 uV) seen predominantly in posterior head regions, symmetric and reactive to eye opening and eye closing.  Physiologic photic driving was seen during photic stimulation.  Hyperventilation was not performed.     IMPRESSION: This study is within normal limits. No seizures or epileptiform discharges were seen throughout the recording.  [name] Barbra Sarks

## 2020-10-28 NOTE — Progress Notes (Signed)
EEG complete - results pending 

## 2020-10-28 NOTE — ED Notes (Signed)
Pt is able to talk now. Will continue to monitor.

## 2020-10-29 DIAGNOSIS — R079 Chest pain, unspecified: Secondary | ICD-10-CM | POA: Diagnosis not present

## 2020-10-29 LAB — COMPREHENSIVE METABOLIC PANEL
ALT: 22 U/L (ref 0–44)
AST: 21 U/L (ref 15–41)
Albumin: 3.1 g/dL — ABNORMAL LOW (ref 3.5–5.0)
Alkaline Phosphatase: 78 U/L (ref 38–126)
Anion gap: 6 (ref 5–15)
BUN: 17 mg/dL (ref 6–20)
CO2: 27 mmol/L (ref 22–32)
Calcium: 8.6 mg/dL — ABNORMAL LOW (ref 8.9–10.3)
Chloride: 103 mmol/L (ref 98–111)
Creatinine, Ser: 1.61 mg/dL — ABNORMAL HIGH (ref 0.61–1.24)
GFR, Estimated: 49 mL/min — ABNORMAL LOW (ref >60)
Glucose, Bld: 123 mg/dL — ABNORMAL HIGH (ref 70–99)
Potassium: 3.9 mmol/L (ref 3.5–5.1)
Sodium: 136 mmol/L (ref 135–145)
Total Bilirubin: 0.4 mg/dL (ref 0.3–1.2)
Total Protein: 6.2 g/dL — ABNORMAL LOW (ref 6.5–8.1)

## 2020-10-29 LAB — HEMOGLOBIN A1C
Hgb A1c MFr Bld: 6.6 % — ABNORMAL HIGH (ref 4.8–5.6)
Mean Plasma Glucose: 142.72 mg/dL

## 2020-10-29 LAB — CBC
HCT: 41.2 % (ref 39.0–52.0)
Hemoglobin: 13.1 g/dL (ref 13.0–17.0)
MCH: 29.5 pg (ref 26.0–34.0)
MCHC: 31.8 g/dL (ref 30.0–36.0)
MCV: 92.8 fL (ref 80.0–100.0)
Platelets: 183 10*3/uL (ref 150–400)
RBC: 4.44 MIL/uL (ref 4.22–5.81)
RDW: 17.1 % — ABNORMAL HIGH (ref 11.5–15.5)
WBC: 11.2 10*3/uL — ABNORMAL HIGH (ref 4.0–10.5)
nRBC: 0 % (ref 0.0–0.2)

## 2020-10-29 LAB — GLUCOSE, CAPILLARY: Glucose-Capillary: 84 mg/dL (ref 70–99)

## 2020-10-29 LAB — TROPONIN I (HIGH SENSITIVITY): Troponin I (High Sensitivity): 21 ng/L — ABNORMAL HIGH (ref <18)

## 2020-10-29 NOTE — TOC Transition Note (Signed)
Transition of Care [facility]) - CM/SW Discharge Note   Patient Details  Name: Chris [name] MRN: [id number] Date of Birth: [birth year]  [facility]) CM/SW Contact:  Pollie Friar, RN Phone Number: 10/29/2020, 12:44 PM   Clinical Narrative:    Patient discharged home with self care. No needs per TOC.   Final next level of care: Home/Self Care Barriers to Discharge: No Barriers Identified   Patient Goals and CMS Choice        Discharge Placement                       Discharge Plan and Services                                     Social Determinants of Health (SDOH) Interventions     Readmission Risk Interventions Readmission Risk Prevention Plan 10/29/2019  Transportation Screening Complete  PCP or Specialist Appt within 3-5 Days Complete  HRI or [place] Complete  Social Work Consult for [place] Planning/Counseling Complete  Palliative Care Screening Not Applicable  Medication Review [employer]) Complete  Some recent data might be hidden

## 2020-10-29 NOTE — ED Notes (Signed)
EEG has not been yet still needs to be completed.

## 2020-10-29 NOTE — Discharge Summary (Signed)
Physician Discharge Summary  Chris [name] [id number] DOB: 06/23/1960 DOA: 10/28/2020  PCP: [name], MD  Admit date: 10/28/2020 Discharge date: 10/29/2020  Admitted From: Home Disposition: Home  Recommendations for Outpatient Follow-up:  1. Follow up with PCP in 1-2 weeks 2. Please obtain BMP/CBC in one week 3. Please follow up with cardiology as scheduled  Home Health: None Equipment/Devices: None  Discharge Condition: Stable CODE STATUS: Full full Diet recommendation: Low-salt low-fat diet  Brief/Interim Summary: Chris [name] is a 60 y.o. male seen in ed with complaints of left sided weakness and seen by neuro and recommended that he be admitted for echocardiogram and EEG.  And also for optimal blood pressure controlled.  HPI is limited as patient is nonverbal. Pt is now speaking and reports that  Earlier he was not able to speak and last night he almost passed out and he did not see doctor last night and today am when he was not able to get up they brought him here. Pt has past medical history of atrial flutter, CHF, COPD, CAD, diabetes mellitus type 2, hypertension, gout, influenza A, tobacco abuse, reduced ejection fraction congestive heart failure with a EF of 25% and global hypokinesis based on echocardiogram in January 2022.  Patient admitted as above with complaints of left-sided weakness, negative neurology work-up at this time, he has had no further episodes or issues of near syncope or syncope.  Remains unclear with true etiology of patient's symptoms or what his symptoms truly were given questionable episodes of weakness syncope near syncope with what appears to be a somewhat transitioning history depending on provider.  At 1 point he was nonverbal during his admission.  Regardless patient is now back to baseline requesting discharge home which certainly reasonable.  Close outpatient follow-up as outlined above, no medication changes at discharge.  Discharge  Diagnoses:  Active Problems:   Left-sided weakness   Discharge Instructions   Allergies as of 10/29/2020   No Known Allergies     Medication List    TAKE these medications   Accu-Chek Softclix Lancets lancets USE AS INSTRUCTED. INJECT INTO THE SKIN ONCE DAILY   albuterol (2.5 MG/3ML) 0.083% nebulizer solution Commonly known as: PROVENTIL TAKE 3 MLS BY NEBULIZATION EVERY 6 (SIX) HOURS AS NEEDED FOR SHORTNESS OF BREATH. What changed:   how much to take  how to take this  when to take this  reasons to take this   ProAir HFA 108 (90 Base) MCG/ACT inhaler Generic drug: albuterol INHALE 2 PUFFS INTO THE LUNGS EVERY 6 (SIX) HOURS AS NEEDED FOR WHEEZING OR SHORTNESS OF BREATH. What changed: Another medication with the same name was changed. Make sure you understand how and when to take each.   allopurinol 100 MG tablet Commonly known as: ZYLOPRIM TAKE 2 TABLETS (200 MG TOTAL) BY MOUTH DAILY. What changed: how much to take   ASPIRIN LOW DOSE 81 MG EC tablet Generic drug: aspirin TAKE 1 TABLET (81 MG TOTAL) BY MOUTH DAILY. What changed: how much to take   atorvastatin 40 MG tablet Commonly known as: LIPITOR TAKE 1 TABLET (40 MG TOTAL) BY MOUTH DAILY AT 6 PM. What changed:   how much to take  when to take this   BiDil 20-37.5 MG tablet Generic drug: isosorbide-hydrALAZINE TAKE 1 TABLET BY MOUTH 3 (THREE) TIMES DAILY.   carvedilol 25 MG tablet Commonly known as: COREG TAKE 1 TABLET (25 MG TOTAL) BY MOUTH 2 (TWO) TIMES DAILY. What changed: how much to  take   colchicine 0.6 MG tablet Take 2 tabs (1.2 mg) at the onset of a gout flare, may repeat 1 tab (0.6 mg) after 2 hours if symptoms persist. What changed:   how much to take  how to take this  when to take this  additional instructions   diclofenac Sodium 1 % Gel Commonly known as: VOLTAREN APPLY 2 G TOPICALLY 4 (FOUR) TIMES DAILY. USE ON ELBOW PAIN   diltiazem 180 MG 24 hr capsule Commonly known as:  CARDIZEM CD Take 180 mg by mouth daily.   Eliquis 5 MG Tabs tablet Generic drug: apixaban TAKE 1 TABLET (5 MG TOTAL) BY MOUTH 2 (TWO) TIMES DAILY.   Entresto 24-26 MG Generic drug: sacubitril-valsartan Take 1 tablet by mouth 2 (two) times daily.   Farxiga 10 MG Tabs tablet Generic drug: dapagliflozin propanediol Take 10 mg by mouth 1 (one) time each day   fluticasone 50 MCG/ACT nasal spray Commonly known as: FLONASE Place 1 spray into both nostrils daily as needed for allergies or rhinitis.   furosemide 40 MG tablet Commonly known as: LASIX TAKE 1 TABLET (40 MG TOTAL) BY MOUTH DAILY. What changed: how much to take   glipiZIDE 5 MG tablet Commonly known as: GLUCOTROL TAKE 0.5 TABLETS (2.5 MG TOTAL) BY MOUTH 2 (TWO) TIMES DAILY BEFORE A MEAL. What changed:   how much to take  how to take this  when to take this   Lokelma 5 g packet Generic drug: sodium zirconium cyclosilicate TAKE 5 G BY MOUTH EVERY OTHER DAY FOR 2 DOSES.   montelukast 10 MG tablet Commonly known as: SINGULAIR TAKE 1 TABLET (10 MG TOTAL) BY MOUTH AT BEDTIME.   predniSONE 20 MG tablet Commonly known as: DELTASONE TAKE 2 TABLETS BY MOUTH DAILY FOR 3 DAYS, THEN 1.5 TABLETS FOR 3 DAYS, THEN 1 TABLET FOR 3 DAYS THEN 1/2 TABLET FOR 3 DAYS   SPS 15 GM/60ML suspension Generic drug: sodium polystyrene TAKE 60 MLS (15 G TOTAL) BY MOUTH ONCE FOR 1 DOSE.   Symbicort 160-4.5 MCG/ACT inhaler Generic drug: budesonide-formoterol INHALE 2 PUFFS INTO THE LUNGS 2 (TWO) TIMES DAILY.   triamcinolone cream 0.1 % Commonly known as: KENALOG Apply 1 application topically 2 (two) times daily as needed (skin irritation on face).       No Known Allergies  Consultations:  Neurology   Procedures/Studies: MR BRAIN WO CONTRAST  Result Date: 10/28/2020 CLINICAL DATA:  Stroke follow-up. EXAM: MRI HEAD WITHOUT CONTRAST TECHNIQUE: Multiplanar, multiecho pulse sequences of the brain and surrounding structures were  obtained without intravenous contrast. Only DWI, FLAIR and GRE were performed per provider request. COMPARISON:  None. FINDINGS: Limited evaluation due to abbreviated protocol. Within this limitation: Brain: No acute infarction, evidence of hemorrhage, hydrocephalus, extra-axial collection or mass lesion. Vascular: Evaluated on same day CTA. Sinuses/Orbits: Clear sinuses.  Unremarkable orbits Other: No sizable mastoid effusions. IMPRESSION: Limited/abbreviated MRI (as detailed above) without evidence of acute infarct. Electronically Signed   By: Margaretha Sheffield MD   On: 10/28/2020 12:23   EEG adult  Result Date: 10/28/2020 Lora Havens, MD     10/28/2020  3:34 PM Patient Name: TREVELL STJEAN MRN: [id number] Epilepsy Attending: Lora Havens Referring Physician/Provider: Dr Amie [place] Date: 10/28/2020 Duration: 25.01 mins Patient history: 60 year old male with sudden onset of unresponsiveness and upon coming around inability to talk and left-sided weakness.  EEG to evaluate for seizures. Level of alertness: Awake AEDs during EEG study: None Technical aspects: This EEG  study was done with scalp electrodes positioned according to the 10-20 International system of electrode placement. Electrical activity was acquired at a sampling rate of 500Hz  and reviewed with a high frequency filter of 70Hz  and a low frequency filter of 1Hz . EEG data were recorded continuously and digitally stored. Description: The posterior dominant rhythm consists of 9 Hz activity of moderate voltage (25-35 uV) seen predominantly in posterior head regions, symmetric and reactive to eye opening and eye closing.  Physiologic photic driving was seen during photic stimulation.  Hyperventilation was not performed.   IMPRESSION: This study is within normal limits. No seizures or epileptiform discharges were seen throughout the recording. Lora Havens   CT HEAD CODE STROKE WO CONTRAST  Result Date: 10/28/2020 CLINICAL DATA:  Code  stroke. Neuro deficit, acute stroke suspected. Right-sided weakness. EXAM: CT HEAD WITHOUT CONTRAST TECHNIQUE: Contiguous axial images were obtained from the base of the skull through the vertex without intravenous contrast. COMPARISON:  None. FINDINGS: Brain: No evidence of acute large vascular territory infarction, hemorrhage, hydrocephalus, extra-axial collection or mass lesion/mass effect. Mild patchy white matter hypodensities, most likely related to chronic microvascular ischemic disease. Partially empty and expanded sella. Vascular: Calcific atherosclerosis. No definite hyperdense vessel identified. Skull: No acute fracture. Sinuses/Orbits: Clear sinuses. Bilateral proptosis with prominent retro bulbar fat. Otherwise, unremarkable orbits. Other: No mastoid effusions. ASPECTS [facility] Stroke Program Early CT Score) Total score (0-10 with 10 being normal): 10 IMPRESSION: 1. No evidence of acute intracranial abnormality. 2. ASPECTS is 3. Partially empty and expanded sella. This finding is nonspecific and may be incidental, but can be seen with idiopathic intracranial hypertension in the correct clinical setting. Code stroke imaging results were communicated on 10/28/2020 at 11:31 am to provider Dr. Rory Percy via secure text paging. Electronically Signed   By: Margaretha Sheffield MD   On: 10/28/2020 11:32   CT ANGIO HEAD NECK W WO CM W PERF (CODE STROKE)  Result Date: 10/28/2020 CLINICAL DATA:  Concern for stroke. EXAM: CT ANGIOGRAPHY HEAD AND NECK CT PERFUSION BRAIN TECHNIQUE: Multidetector CT imaging of the head and neck was performed using the standard protocol during bolus administration of intravenous contrast. Multiplanar CT image reconstructions and MIPs were obtained to evaluate the vascular anatomy. Carotid stenosis measurements (when applicable) are obtained utilizing NASCET criteria, using the distal internal carotid diameter as the denominator. Multiphase CT imaging of the brain was performed following  IV bolus contrast injection. Subsequent parametric perfusion maps were calculated using RAPID software. CONTRAST:  162mL OMNIPAQUE IOHEXOL 350 MG/ML SOLN COMPARISON:  None. FINDINGS: CTA NECK FINDINGS Aortic arch: The aorta and great vessel origins are not imaged. Right carotid system: No evidence of dissection, stenosis (50% or greater) or occlusion. Left carotid system: The left common carotid artery origin is not imaged. Within this limitation, no evidence of dissection, stenosis (50% or greater), or occlusion. Mild calcific atherosclerosis at the carotid bifurcation. Vertebral arteries: Left dominant. No evidence of dissection, stenosis (50% or greater) or occlusion. Skeleton: Mild multilevel degenerative change. Other neck: No evidence of acute abnormality. Mildly prominent nonspecific bilateral submandibular and submental lymph nodes. Upper chest: Visualized lung apices are clear. Review of the MIP images confirms the above findings CTA HEAD FINDINGS Anterior circulation: No large vessel occlusion, proximal hemodynamically significant stenosis. Approximately 2 mm inferomedially directed outpouching arising from the left distal cavernous internal carotid artery (see series 9, image 108; series 10, image 122 and series 11, image 115) concerning for aneurysm. Posterior circulation: No large vessel occlusion, proximal  hemodynamically significant stenosis, or aneurysm. Venous sinuses: As permitted by contrast timing, patent. CT Brain Perfusion Findings: ASPECTS: 10. CBF (<30%) Volume: 52mL Perfusion (Tmax>6.0s) volume: 40mL Mismatch Volume: 2mL Infarction Location:None identified. IMPRESSION: CTA head: 1. No emergent large vessel occlusion or proximal hemodynamically significant stenosis. 2. 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 3. Nonspecific mildly prominent bilateral submandibular and submental lymph nodes. Recommend clinical follow-up. CTA Neck:  Within the limitations detailed above, no visible hemodynamically significant stenosis in the neck. Perfusion: No evidence of penumbra or core infarct. Preliminary critical findings discussed with Dr. Rory Percy via telephone at 11:32 a.m. Electronically Signed   By: Margaretha Sheffield MD   On: 10/28/2020 11:55     Subjective: No acute issues or events overnight   Discharge Exam: Vitals:   10/29/20 0516 10/29/20 0800  BP: (!) 155/91 (!) 165/98  Pulse: 73 70  Resp: 18 18  Temp: 97.7 F (36.5 C) (!) 97.4 F (36.3 C)  SpO2: 98% 100%   Vitals:   10/29/20 0105 10/29/20 0359 10/29/20 0516 10/29/20 0800  BP: (!) 127/59 (!) 142/88 (!) 155/91 (!) 165/98  Pulse: 78 65 73 70  Resp: 16 18 18 18   Temp:  97.7 F (36.5 C) 97.7 F (36.5 C) (!) 97.4 F (36.3 C)  TempSrc:  Oral  Oral  SpO2: 95% 98% 98% 100%    General: Pt is alert, awake, not in acute distress Cardiovascular: RRR, S1/S2 +, no rubs, no gallops Respiratory: CTA bilaterally, no wheezing, no rhonchi Abdominal: Soft, NT, ND, bowel sounds + Extremities: no edema, no cyanosis    The results of significant diagnostics from this hospitalization (including imaging, microbiology, ancillary and laboratory) are listed below for reference.     Microbiology: Recent Results (from the past 240 hour(s))  SARS CORONAVIRUS 2 (TAT 6-24 HRS) Nasopharyngeal Nasopharyngeal Swab     Status: None   Collection Time: 10/28/20 12:35 PM   Specimen: Nasopharyngeal Swab  Result Value Ref Range Status   SARS Coronavirus 2 NEGATIVE NEGATIVE Final    Comment: (NOTE) SARS-CoV-2 target nucleic acids are NOT DETECTED.  The SARS-CoV-2 RNA is generally detectable in upper and lower respiratory specimens during the acute phase of infection. Negative results do not preclude SARS-CoV-2 infection, do not rule out co-infections with other pathogens, and should not be used as the sole basis for treatment or other patient management decisions. Negative results  must be combined with clinical observations, patient history, and epidemiological information. The expected result is Negative.  Fact Sheet for Patients: SugarRoll.be  Fact Sheet for Healthcare Providers: https://www.woods-mathews.com/  This test is not yet approved or cleared by the [place] FDA and  has been authorized for detection and/or diagnosis of SARS-CoV-2 by FDA under an Emergency Use Authorization (EUA). This EUA will remain  in effect (meaning this test can be used) for the duration of the COVID-19 declaration under Se ction 564(b)(1) of the Act, 21 U.S.C. section 360bbb-3(b)(1), unless the authorization is terminated or revoked sooner.  Performed at [facility], [place] [address], [place], [place] [address]      Labs: BNP (last 3 results) Recent Labs    06/17/20 0523 06/18/20 0248 07/03/20 2031  BNP 2,736.3* 2,267.5* 123456*   Basic Metabolic Panel: Recent Labs  Lab 10/28/20 1115 10/28/20 1117 10/29/20 0050  NA 140 141 136  K 4.3 4.2 3.9  CL 103 104 103  CO2 27  --  27  GLUCOSE 80  80 123*  BUN 16 19 17   CREATININE 1.46* 1.40* 1.61*  CALCIUM 9.4  --  8.6*   Liver Function Tests: Recent Labs  Lab 10/28/20 1115 10/29/20 0050  AST 24 21  ALT 26 22  ALKPHOS 92 78  BILITOT 0.9 0.4  PROT 7.5 6.2*  ALBUMIN 3.8 3.1*   No results for input(s): LIPASE, AMYLASE in the last 168 hours. No results for input(s): AMMONIA in the last 168 hours. CBC: Recent Labs  Lab 10/28/20 1115 10/28/20 1117 10/29/20 0050  WBC 14.0*  --  11.2*  NEUTROABS 8.3*  --   --   HGB 15.2 15.3 13.1  HCT 47.8 45.0 41.2  MCV 93.2  --  92.8  PLT 238  --  183   Cardiac Enzymes: No results for input(s): CKTOTAL, CKMB, CKMBINDEX, TROPONINI in the last 168 hours. BNP: Invalid input(s): POCBNP CBG: Recent Labs  Lab 10/28/20 1113 10/28/20 1707 10/28/20 2149 10/29/20 0759  GLUCAP 81 83 141* 84   D-Dimer No results for  input(s): DDIMER in the last 72 hours. Hgb A1c Recent Labs    10/29/20 0051  HGBA1C 6.6*   Lipid Profile No results for input(s): CHOL, HDL, LDLCALC, TRIG, CHOLHDL, LDLDIRECT in the last 72 hours. Thyroid function studies No results for input(s): TSH, T4TOTAL, T3FREE, THYROIDAB in the last 72 hours.  Invalid input(s): FREET3 Anemia work up No results for input(s): VITAMINB12, FOLATE, FERRITIN, TIBC, IRON, RETICCTPCT in the last 72 hours. Urinalysis    Component Value Date/Time   COLORURINE YELLOW (A) 07/03/2020 2032   APPEARANCEUR CLEAR (A) 07/03/2020 2032   APPEARANCEUR Clear 11/30/2018 1035   LABSPEC 1.011 07/03/2020 2032   PHURINE 6.0 07/03/2020 2032   GLUCOSEU NEGATIVE 07/03/2020 2032   HGBUR SMALL (A) 07/03/2020 2032   BILIRUBINUR NEGATIVE 07/03/2020 2032   BILIRUBINUR Negative 11/30/2018 [place] 07/03/2020 2032   PROTEINUR 100 (A) 07/03/2020 2032   NITRITE NEGATIVE 07/03/2020 2032   LEUKOCYTESUR NEGATIVE 07/03/2020 2032   Sepsis Labs Invalid input(s): PROCALCITONIN,  WBC,  LACTICIDVEN Microbiology Recent Results (from the past 240 hour(s))  SARS CORONAVIRUS 2 (TAT 6-24 HRS) Nasopharyngeal Nasopharyngeal Swab     Status: None   Collection Time: 10/28/20 12:35 PM   Specimen: Nasopharyngeal Swab  Result Value Ref Range Status   SARS Coronavirus 2 NEGATIVE NEGATIVE Final    Comment: (NOTE) SARS-CoV-2 target nucleic acids are NOT DETECTED.  The SARS-CoV-2 RNA is generally detectable in upper and lower respiratory specimens during the acute phase of infection. Negative results do not preclude SARS-CoV-2 infection, do not rule out co-infections with other pathogens, and should not be used as the sole basis for treatment or other patient management decisions. Negative results must be combined with clinical observations, patient history, and epidemiological information. The expected result is Negative.  Fact Sheet for  Patients: SugarRoll.be  Fact Sheet for Healthcare Providers: https://www.woods-mathews.com/  This test is not yet approved or cleared by the [place] FDA and  has been authorized for detection and/or diagnosis of SARS-CoV-2 by FDA under an Emergency Use Authorization (EUA). This EUA will remain  in effect (meaning this test can be used) for the duration of the COVID-19 declaration under Se ction 564(b)(1) of the Act, 21 U.S.C. section 360bbb-3(b)(1), unless the authorization is terminated or revoked sooner.  Performed at [facility], [place] [address], [place], [place] [address]      Time coordinating discharge: Over 30 minutes  SIGNED:   Luanna Cole  Avon Gully, DO [facility] 10/29/2020, 12:14 PM Pager   If 7PM-7AM, please contact night-coverage [email or web address]

## 2020-10-29 NOTE — Progress Notes (Signed)
Patient left AMA. Refused to sign a AMA form. Patient states " the doctor told me I can leave so i'm leaving."

## 2020-10-30 ENCOUNTER — Telehealth: Payer: Self-pay

## 2020-10-30 ENCOUNTER — Telehealth: Payer: Self-pay | Admitting: *Deleted

## 2020-10-30 NOTE — Telephone Encounter (Signed)
Transition Care Management Unsuccessful Follow-up Telephone Call  Date of discharge and from where:  10/29/2020 - [facility]  Attempts:  1st Attempt  Reason for unsuccessful TCM follow-up call:  Left voice message

## 2020-10-30 NOTE — Telephone Encounter (Signed)
Transition Care Management Follow-up Telephone Call  Date of discharge and from where: 10/29/2020, [facility].  Left AMA  How have you been since you were released from the hospital? He said he is feeling "really terrible."  He has "real bad pain all over" - his back, legs and ankles. He said that his legs and ankles are swollen. He has taken tylenol and ibuprofen with no relief. The pain is constant and aggravated with movement.  When asked if he had this problem when he was in the hospital, he said yes and he discussed this with the providers there and was told that he needs to be referred to a pain clinic. Even with a referral to a pain clinic, he would not be seen immediately.   Explained to him that this information would be shared with Dr Wynetta Emery. He said he is considering returning to the hospital because of the pain. He said that he was given a percocet in the hospital and that didn't even help.  This CM explained to him that he could be seen today and the mobile medical unit or urgent care. There is no guarantee what, if any, pain medication would be prescribed. Provided him with the location of the mobile unit today as well as the hours and that is it first come, first serve.  This information was also provided to his cousin who was with him. Also informed him of the location of [facility] on [address].   Any questions or concerns? Yes  - noted above.   Items Reviewed:  Did the pt receive and understand the discharge instructions provided? he said he received instructions but her Epic , there is no AVS for this hospital stay,  Medications obtained and verified? He said that he has all medications. Nothing new was prescribed.   Other? No   Any new allergies since your discharge? No   Dietary orders reviewed? No  Do you have support at home? Yes  - he said that he is staying with his brother.  Home Care and Equipment/Supplies: Were home health services ordered? no If  so, what is the name of the agency? n/a  Has the agency set up a time to come to the patient's home? not applicable Were any new equipment or medical supplies ordered?  No What is the name of the medical supply agency? n/a Were you able to get the supplies/equipment? not applicable Do you have any questions related to the use of the equipment or supplies? No   He said that he has a nebulizer and he needs a new glucometer.  Functional Questionnaire: (I = Independent and D = Dependent) ADLs:ambulating with a cane. Independent with ADLs. He said that his brother checks on him to make sure he is okay.   Follow up appointments reviewed:   PCP Hospital f/u appt confirmed? Yes  Freeman Caldron, PA - 11/12/2020.  [name]  [facility] f/u appt confirmed? Yes  -per patient - Kidney doctor 11/03/2020.    Are transportation arrangements needed? No  - family can assist with transportation.  The patient also verbalized that he can call his insurance company to schedule rides to medical appointments.   If their condition worsens, is the pt aware to call PCP or go to the Emergency Dept.? Yes  Was the patient provided with contact information for the PCP's office or ED? Yes  Was to pt encouraged to call back with questions or concerns? Yes

## 2020-10-31 ENCOUNTER — Ambulatory Visit: Payer: Medicaid Other

## 2020-10-31 ENCOUNTER — Other Ambulatory Visit: Payer: Self-pay | Admitting: *Deleted

## 2020-10-31 ENCOUNTER — Other Ambulatory Visit: Payer: Self-pay

## 2020-10-31 NOTE — Telephone Encounter (Signed)
TOC call completed by CHW.

## 2020-10-31 NOTE — Patient Instructions (Signed)
Visit Information  Chris [name] was given information about Medicaid Managed Care team care coordination services as a part of their Healthy [facility] Medicaid benefit. Chris [name] verbally consented to engagement with the [facility] Managed Care team.   For questions related to your Healthy [facility] health plan, please call: [phone] or visit the homepage here: [email or web address]  If you would like to schedule transportation through your Healthy [facility] plan, please call the following number at least 2 days in advance of your appointment: [phone]   Call the [facility] at [phone], at any time, 24 hours a day, 7 days a week. If you are in danger or need immediate medical attention call 911.  Chris [name] - following are the goals we discussed in your visit today:  Goals Addressed            This Visit's Progress   . Track and Manage Fluids and Swelling-Heart Failure       Timeframe:  Long-Range Goal Priority:  High Start Date: 07/02/20                         Expected End Date: 01/30/21      Follow up 12/03/20               -Call to fill prescriptions one week before I run out of medication -Take all medications as prescribed - call office if I gain more than 2 pounds in one day or 5 pounds in one week - use salt in moderation - watch for swelling in feet, ankles and legs every day    Why is this important?    It is important to check your weight daily and watch how much salt and liquids you have.   It will help you to manage your heart failure.        . Track and Manage Symptoms-Heart Failure       Timeframe:  Long-Range Goal Priority:  Medium Start Date:  07/02/20                           Expected End Date: 01/30/21                     Follow up 12/03/20     - eat more whole grains, fruits and vegetables, lean meats and healthy fats - know when to call the doctor - track symptoms and what helps  feel better or worse - dress right for the weather, hot or cold    Why is this important?    You will be able to handle your symptoms better if you keep track of them.   Making some simple changes to your lifestyle will help.   Eating healthy is one thing you can do to take good care of yourself.           Please see education materials related to health provided as print materials.     Health Maintenance, Male Adopting a healthy lifestyle and getting preventive care are important in promoting health and wellness. Ask your health care provider about:  The right schedule for you to have regular tests and exams.  Things you can do on your own to prevent diseases and keep yourself healthy. What should I know about diet, weight, and exercise? Eat a healthy diet  Eat a diet that includes plenty of vegetables, fruits, low-fat dairy products, and  lean protein.  Do not eat a lot of foods that are high in solid fats, added sugars, or sodium.   Maintain a healthy weight Body mass index (BMI) is a measurement that can be used to identify possible weight problems. It estimates body fat based on height and weight. Your health care provider can help determine your BMI and help you achieve or maintain a healthy weight. Get regular exercise Get regular exercise. This is one of the most important things you can do for your health. Most adults should:  Exercise for at least 150 minutes each week. The exercise should increase your heart rate and make you sweat (moderate-intensity exercise).  Do strengthening exercises at least twice a week. This is in addition to the moderate-intensity exercise.  Spend less time sitting. Even light physical activity can be beneficial. Watch cholesterol and blood lipids Have your blood tested for lipids and cholesterol at 58 years of age, then have this test every 5 years. You may need to have your cholesterol levels checked more often if:  Your lipid or  cholesterol levels are high.  You are older than 58 years of age.  You are at high risk for heart disease. What should I know about cancer screening? Many types of cancers can be detected early and may often be prevented. Depending on your health history and family history, you may need to have cancer screening at various ages. This may include screening for:  Colorectal cancer.  Prostate cancer.  Skin cancer.  Lung cancer. What should I know about heart disease, diabetes, and high blood pressure? Blood pressure and heart disease  High blood pressure causes heart disease and increases the risk of stroke. This is more likely to develop in people who have high blood pressure readings, are of African descent, or are overweight.  Talk with your health care provider about your target blood pressure readings.  Have your blood pressure checked: ? Every 3-5 years if you are 2-16 years of age. ? Every year if you are 28 years old or older.  If you are between the ages of 19 and 7 and are a current or former smoker, ask your health care provider if you should have a one-time screening for abdominal aortic aneurysm (AAA). Diabetes Have regular diabetes screenings. This checks your fasting blood sugar level. Have the screening done:  Once every three years after age 18 if you are at a normal weight and have a low risk for diabetes.  More often and at a younger age if you are overweight or have a high risk for diabetes. What should I know about preventing infection? Hepatitis B If you have a higher risk for hepatitis B, you should be screened for this virus. Talk with your health care provider to find out if you are at risk for hepatitis B infection. Hepatitis C Blood testing is recommended for:  Everyone born from [birth year] through 1963.  Anyone with known risk factors for hepatitis C. Sexually transmitted infections (STIs)  You should be screened each year for STIs, including gonorrhea  and chlamydia, if: ? You are sexually active and are younger than 58 years of age. ? You are older than 58 years of age and your health care provider tells you that you are at risk for this type of infection. ? Your sexual activity has changed since you were last screened, and you are at increased risk for chlamydia or gonorrhea. Ask your health care provider if you are at  risk.  Ask your health care provider about whether you are at high risk for HIV. Your health care provider may recommend a prescription medicine to help prevent HIV infection. If you choose to take medicine to prevent HIV, you should first get tested for HIV. You should then be tested every 3 months for as long as you are taking the medicine. Follow these instructions at home: Lifestyle  Do not use any products that contain nicotine or tobacco, such as cigarettes, e-cigarettes, and chewing tobacco. If you need help quitting, ask your health care provider.  Do not use street drugs.  Do not share needles.  Ask your health care provider for help if you need support or information about quitting drugs. Alcohol use  Do not drink alcohol if your health care provider tells you not to drink.  If you drink alcohol: ? Limit how much you have to 0-2 drinks a day. ? Be aware of how much alcohol is in your drink. In the [place] one drink equals one 12 oz bottle of beer (355 mL), one 5 oz glass of wine (148 mL), or one 1 oz glass of hard liquor (44 mL). General instructions  Schedule regular health, dental, and eye exams.  Stay current with your vaccines.  Tell your health care provider if: ? You often feel depressed. ? You have ever been abused or do not feel safe at home. Summary  Adopting a healthy lifestyle and getting preventive care are important in promoting health and wellness.  Follow your health care provider's instructions about healthy diet, exercising, and getting tested or screened for diseases.  Follow your  health care provider's instructions on monitoring your cholesterol and blood pressure. This information is not intended to replace advice given to you by your health care provider. Make sure you discuss any questions you have with your health care provider. Document Revised: 05/24/2018 Document Reviewed: 05/24/2018 Elsevier Patient Education  2021 [employer].    The patient verbalized understanding of instructions provided today and agreed to receive a mailed copy of patient instruction and/or educational materials.  Telephone follow up appointment with Managed Medicaid care management team member scheduled for:12/03/20 @ 3:30pm  [name] RN, BSN [place] RN Care Coordinator   Following is a copy of your plan of care:  Patient Care Plan: Heart Failure (Adult)    Problem Identified: Symptom Exacerbation (Heart Failure)     Long-Range Goal: Symptom Exacerbation Prevented or Minimized   Start Date: 07/02/2020  Expected End Date: 12/01/2020  Recent Progress: On track  Priority: High  Note:   Current Barriers:  . Chronic Disease Management, support and educational needs related to management of multiple chronic diagnosis. Chris [name] is managing CHF, DMII, stage III CKD and chronic respiratory failure. He missed his last appointment with his PCP and cardiologist. He reports eating right, checking his blood sugar daily and taking all of his medications. He recently moved and is living with his brother, desires his own place.[name] He reports feeling better. He is unable to focus on his health due to his unhappiness with his current living situation. He is living with his brother, but he only wants a place of his own. He is down today, denies feelings or harming himself or others.  Nurse Case Manager Clinical Goal(s):  . patient will work with Jackelyn Poling Bailey/[facility] to  address needs related to housing-Chris [name]  reports no assistance available . patient will meet with RN Care Manager to address needs related to managing CHF and DMII-unable to focus on health management . patient will demonstrate improved health management independence as evidenced bytaking prescribed medications, reporting any changes in symptoms to PCP such as swelling, changes in breathing, new cough . Patient will work with [facility] pharmacist, Ovid Curd for medication management . Patient will work with Jerene Pitch, LCSW for mental health . Patient will work with Ubaldo Glassing for housing insecurity  Interventions:  . Inter-disciplinary care team collaboration (see longitudinal plan of care) . Evaluation of current treatment plan related to CHF and DMII and patient's adherence to plan as established by provider. . Advised patient to follow up with PCP appointment for 11/12/20 @ 11:10 . Encouraged patient to schedule follow up cardiology appointment . Collaborated with [name] regarding housing need . Discussed plans with patient for ongoing care management follow up and provided patient with direct contact information for care management team . Encouraged patient to have patience while looking for housing, spend time at the park or with friends . Provided therapeutic listening  Patient Goals/Self-Care Activities Over the next 30 days, patient will: -Call to fill prescriptions one week before I run out of medication -Take all medications as prescribed - call office if I gain more than 2 pounds in one day or 5 pounds in one week - use salt in moderation - watch for swelling in feet, ankles and legs every day  - eat more whole grains, fruits and vegetables, lean meats and healthy fats - know when to call the doctor - track symptoms and what helps feel better or worse - dress right for the weather, hot or cold   Follow Up Plan: Telephone follow up appointment with Managed Medicaid care management team member  scheduled for:12/03/20 @ 3:30pm      Patient Care Plan: General Social Work (Adult)    Problem Identified: Homelessness   Onset Date: 07/09/2020  Note:   Chris [name] is a 60 y.o. year old male who sees [name], MD for primary care. The  Medicaid Managed Care team was consulted for assistance with Housing barriers. Mr. Bednarczyk was given information about Care Management services, agreed to services, and verbal consent for services was obtained.  Interventions:  . Patient interviewed and appropriate assessments performed . Collaborated with clinical team regarding patient needs  . SDOH (Social Determinants of Health) assessments performed: Yes .     [name] Provided patient with information about the [facility]. [name] contacted [facility] to complete a referral for patient to get into the homeless motel. [name] left a voicemail. [name] informed patient that the process can take a long time. Patient states he does not know why no one wants to help him. [name] informed patient he may be able to go to a shelter in [facility], patient states he does not feel safe in [place].  [name] contacted [employer] in [place] [phone] and left a voicemail for the intake coordinator. . [name] contacted [place] to speak with a Education officer, museum but kept getting transferred.  . Patient states he does not have any money to get a room and become frustrated again stating that we "[place]"do not want to help him. [name] informed patient that she will wait for the [facility] and/or the [employer] to contact her back. [name] informed patient she could provide him with the information for [facility] but patient did not want to call. [name]  Update 07/22/20: Patient stated he is doing okay since his surgery but still wants somewhere else to live. Patient stated he knows of a boarding house on [address] and would like SW to help him get in it. SW researched and could not find a boarding house on  [address] [place], Patient stated he would get the name of it and contact [name] with the information. [name] Update 10/14/20: [name] contacted patient to complete a follow up. Patient states he is currently living with his brother but is ready to be on his own. He states he sister has found a house for him to stay but it needs a lot of work. Patient is asking for assistance with programs that help disabled with fixing up homes. Patient states the home is in [place] and needs windows and flooring done. [name] will research programs that assists with home repairs/modeling. Patient stated he is going to the beach May 6-18. [name] will follow up with patient on May 23 at 3pm.  Plan:  . Over the next 30-60 days, patient will work with [name] to address needs related to Housing barriers . Social Worker will continue to follow up with patient.   [name], [name], [place]  High Risk Managed Medicaid Team         Problem Identified: Depression Identification (Depression)     Long-Range Goal: Ongoing depression   Start Date: 10/07/2020  Priority: High  Note:   Timeframe:  Long-Range Goal Priority:  High Start Date:    10/07/20                        Expected End Date:  12/07/20                    Follow Up Date 10/17/20   - begin personal counseling - call and visit an old friend - check out volunteer opportunities - join a support group - laugh; watch a funny movie or comedian - learn and use visualization or guided imagery - perform a random act of kindness - practice relaxation or meditation daily - start or continue a personal journal - talk about feelings with a friend, family or spiritual advisor - practice positive thinking and self-talk    Why is this important?    When you are stressed, down or upset, your body reacts too.   For example, your blood pressure may get higher; you may have a headache or stomachache.   When your emotions get the best of you, your body's  ability to fight off cold and flu gets weak.   These steps will help you manage your emotions.   Current Barriers:  . Chronic Mental Health needs related to depression and need for housing . Limited social support, Housing barriers, and Mental Health Concerns  . Social Isolation . ADL IADL limitations . Suicidal Ideation/Homicidal Ideation: No  Clinical Social Work Goal(s):  [name] Over the next 120 days, patient will work with SW monthly by telephone or in person to reduce or manage symptoms related to depression . patient will work with [name] to address needs related to finding stable housing but patient was made aware that housing resources are very limited at this time  Interventions: . Patient interviewed and appropriate assessments performed: brief mental health assessment . PHQ 2 . PHQ 9 SDOH Interventions    Flowsheet Row Most Recent Value  SDOH Interventions   SDOH Interventions for the Following Domains  Housing  Housing Interventions Other (Comment)  [Referral for housing support]  Depression Interventions/Treatment  Patient refuses Treatment    .   [name] Patient interviewed and appropriate assessments performed . Discussed plans with patient for ongoing care management follow up and provided patient with direct contact information for care management team . Assisted patient/caregiver with obtaining information about health plan benefits . Encouraged patient to consider a mental health provider for long term follow up and therapy/counseling but patient declined. Patient reports that does not need a psychiatry referral at this time. He reports that his main source of depression is from not being able to find a place on his own. He is currently stable and residing with his brother and his brother's family in their home. Patient reports that his brother leaves for work at 3 pm during the week and gets back home around 1 am. [employer] number provided to patient. Patient  reports that his sister has found him a house to relocate in. This house is inherited from the family and will need some additional work done. Patient reports that this house has mold in it but he rather reside there instead of living with his brother. Patient is currently stable and reports no urgent concerns at this time. [name] [facility] LCSW completed care coordination with [facility] [name] on 10/07/20 who will follow up with patient this week. . Patient reports recent agitation due to his inability to secure housing on this own. [facility] LCSW provided emotional support and brief housing resource education. Patient is currently not on any public housing wait list.   . Solution-Focused Strategies, Mindfulness or Relaxation Training, Active listening / Reflection utilized , Emotional Supportive Provided, and Verbalization of feelings encouraged  . Patient decline any current substance use. He reports that his last use of cocaine was over 6 months ago. . Patient reports that he has developed social anxiety. He admits that he gets irritable at times and will snap at his loved ones and then will experience guilt afterwards. Educated patient on coping methods to implement into his daily life to combat anxiety symptoms and stress. Patient denied any current suicidal or homicidal ideations. Encouraged patient to implement deep breathing and grounding exercises into his daily routine due to ongoing anxiety and SOB.  Patient Self Care Activities:  . Ability for insight . Independent living . Strong family or social support  Patient Coping Strengths:  . Conservator, museum/gallery . Able to Communicate Effectively  Patient Self Care Deficits:  . Lacks social connections  Depression screen [facility] 2/9 10/07/2020 11/01/2019 09/11/2019 07/09/2019 04/04/2019  Decreased Interest 2 2 0 1 3  Down, Depressed, Hopeless 2 3 1 1 3   PHQ - 2 Score 4 5 1 2 6   Altered sleeping 1 3 3  0 3  Tired, decreased energy 3 2 0 1 3  Change in appetite 0 - 0 1 0  Feeling  bad or failure about yourself  2 2 0 0 3  Trouble concentrating 2 2 0 0 3  Moving slowly or fidgety/restless 0 - 0 0 3  Suicidal thoughts 0 0 0 0 0  PHQ-9 Score 12 14 4 4 21   Difficult doing work/chores Somewhat difficult - - - Extremely dIfficult     Task: Identify Depressive Symptoms and Facilitate Treatment   Note:   Care Management Activities:    - depression screen reviewed - participation in psychiatric services encouraged    Notes:    Patient Care Plan: Medication Management  Problem Identified: Health Promotion or Disease Self-Management (General Plan of Care)     Goal: Medication Management   Note:   Current Barriers:  . Does not adhere to prescribed medication regimen . Does not maintain contact with provider office . Does not contact provider office for questions/concerns .   Pharmacist Clinical Goal(s):  [name] Over the next 30 days, patient will contact provider office for questions/concerns as evidenced notation of same in electronic health record through collaboration with PharmD and provider.  .   Interventions: . Inter-disciplinary care team collaboration (see longitudinal plan of care) . Comprehensive medication review performed; medication list updated in electronic medical record  @RXCPDIABETES @ @RXCPHYPERTENSION @ @RXCPHYPERLIPIDEMIA @  Patient Goals/Self-Care Activities . Over the next 30 days, patient will:  - collaborate with provider on medication access solutions  Follow Up Plan: The care management team will reach out to the patient again over the next 30 days.

## 2020-10-31 NOTE — Patient Outreach (Signed)
Medicaid Managed Care   Nurse Care Manager Note  10/31/2020 Name:  Chris [name] MRN:  [id number] DOB:  Oct 15, 1960  Chris [name] is an 60 y.o. year old male who is a primary patient of [name], MD.  The [facility] Managed Care Coordination team was consulted for assistance with:    CHF DMII  Chris [name] was given information about Medicaid Managed Care Coordination team services today. Chris [name] agreed to services and verbal consent obtained.  Engaged with patient by telephone for follow up visit in response to provider referral for case management and/or care coordination services.   Assessments/Interventions:  Review of past medical history, allergies, medications, health status, including review of consultants reports, laboratory and other test data, was performed as part of comprehensive evaluation and provision of chronic care management services.  SDOH (Social Determinants of Health) assessments and interventions performed:   Care Plan  RNCM unable to review medications with patient today. He does not know his medications and does not have a current list with him.   Patient Active Problem List   Diagnosis Date Noted  . Left-sided weakness 10/28/2020  . COPD exacerbation ([place])   . Typical atrial flutter ([place])   . Acute respiratory failure with hypoxia ([place]) 07/05/2020  . CHF (congestive heart failure) ([place]) 07/04/2020  . Acute exacerbation of CHF (congestive heart failure) ([place]) 06/16/2020  . COPD with acute exacerbation ([place]) 06/16/2020  . Influenza vaccine refused 05/06/2020  . Acute on chronic combined systolic (congestive) and diastolic (congestive) heart failure ([place]) 05/05/2020  . Acute decompensated heart failure ([place]) 05/04/2020  . Illiteracy 05/04/2020  . Chronic respiratory failure with hypoxia, on home oxygen therapy ([place]) 12/28/2019  . Type 2 diabetes mellitus with stage 3 chronic kidney disease ([place]) 12/25/2019  . Acute and chronic  respiratory failure (acute-on-chronic) ([place]) 12/25/2019  . Acute on chronic combined systolic and diastolic CHF (congestive heart failure) ([place]) 10/26/2019  . Elevated troponin I level 10/26/2019  . Acute on chronic diastolic (congestive) heart failure ([place]) 10/26/2019  . History of gout 02/01/2019  . Seasonal allergic rhinitis due to pollen 02/01/2019  . Tobacco dependence 11/30/2018  . Microscopic hematuria 11/30/2018  . Depression 11/30/2018  . Difficulty controlling anger 11/30/2018  . CAP (community acquired pneumonia) 08/11/2018  . COPD (chronic obstructive pulmonary disease) ([place])   . CKD (chronic kidney disease) stage 3, GFR 30-59 ml/min (HCC) 08/10/2018  . Recurrent epistaxis 04/21/2018  . Mixed hyperlipidemia 07/28/2017  . Essential hypertension 07/28/2017  . Chronic systolic heart failure ([place]) 10/25/2014  . Cocaine abuse ([place]) 02/20/2013  . Cannabis abuse 02/20/2013  . Back pain, chronic 02/20/2013    Conditions to be addressed/monitored per PCP order:  CHF and DMII  Care Plan : Heart Failure (Adult)  Updates made by [name], RN since 10/31/2020 12:00 AM    Problem: Symptom Exacerbation (Heart Failure)     Long-Range Goal: Symptom Exacerbation Prevented or Minimized   Start Date: 07/02/2020  Expected End Date: 12/01/2020  Recent Progress: On track  Priority: High  Note:   Current Barriers:  . Chronic Disease Management, support and educational needs related to management of multiple chronic diagnosis. Chris [name] is managing CHF, DMII, stage III CKD and chronic respiratory failure. He missed his last appointment with his PCP and cardiologist. He reports eating right, checking his blood sugar daily and taking all of his medications. He recently moved and is living with his brother, desires his own place.-Update-Chris [name] had a  recent visit to the ED for pain and weakness. He reports feeling better. He is unable to focus on his health due to his unhappiness with  his current living situation. He is living with his brother, but he only wants a place of his own. He is down today, denies feelings or harming himself or others.  Nurse Case Manager Clinical Goal(s):  . patient will work with Jackelyn Poling [facility] to address needs related to housing-Chris [name] reports no assistance available . patient will meet with RN Care Manager to address needs related to managing CHF and DMII-unable to focus on health management . patient will demonstrate improved health management independence as evidenced bytaking prescribed medications, reporting any changes in symptoms to PCP such as swelling, changes in breathing, new cough . Patient will work with MM pharmacist, Ovid Curd for medication management . Patient will work with Jerene Pitch, LCSW for mental health . Patient will work with Ubaldo Glassing for housing insecurity  Interventions:  . Inter-disciplinary care team collaboration (see longitudinal plan of care) . Evaluation of current treatment plan related to CHF and DMII and patient's adherence to plan as established by provider. . Advised patient to follow up with PCP appointment for 11/12/20 @ 11:10 . Encouraged patient to schedule follow up cardiology appointment . Collaborated with [name] regarding housing need . Discussed plans with patient for ongoing care management follow up and provided patient with direct contact information for care management team . Encouraged patient to have patience while looking for housing, spend time at the park or with friends . Provided therapeutic listening  Patient Goals/Self-Care Activities Over the next 30 days, patient will: -Call to fill prescriptions one week before I run out of medication -Take all medications as prescribed - call office if I gain more than 2 pounds in one day or 5 pounds in one week - use salt in moderation - watch for swelling in feet, ankles and legs every day  - eat more whole grains, fruits  and vegetables, lean meats and healthy fats - know when to call the doctor - track symptoms and what helps feel better or worse - dress right for the weather, hot or cold   Follow Up Plan: Telephone follow up appointment with Managed Medicaid care management team member scheduled for:12/03/20 @ 3:30pm        Follow Up:  Patient agrees to Care Plan and Follow-up.  Plan: The Managed Medicaid care management team will reach out to the patient again over the next 30 days.  Date/time of next scheduled RN care management/care coordination outreach: 12/03/20 @ 3:30pm  [name] RN, [place] RN Care Coordinator

## 2020-11-03 ENCOUNTER — Ambulatory Visit: Payer: Self-pay

## 2020-11-03 ENCOUNTER — Other Ambulatory Visit: Payer: Self-pay

## 2020-11-03 NOTE — Patient Outreach (Signed)
Care Coordination  11/03/2020  Chris [name] 1960/12/18 [id number]   Medicaid Managed Care   Unsuccessful Outreach Note  11/03/2020 Name: Chris [name] MRN: [id number] DOB: Aug 27, 1960  Referred by: [name], MD Reason for referral : High Risk Managed Medicaid (MM Social Work Unsuccessful Telephone Outreach)   An unsuccessful telephone outreach was attempted today. The patient was referred to the case management team for assistance with care management and care coordination.   Follow Up Plan: The care management team will reach out to the patient again over the next 14 days.   [name], [name], [place] Managed Medicaid Team  [phone]

## 2020-11-03 NOTE — Patient Instructions (Signed)
Visit Information  [name]  - as a part of your Medicaid benefit, you are eligible for care management and care coordination services at no cost or copay. I was unable to reach you by phone today but would be happy to help you with your health related needs. Please feel free to call me @ [phone].   A member of the Managed Medicaid care management team will reach out to you again over the next 14 days.   [name], [name], [place] Managed Medicaid Team  [phone]

## 2020-11-04 ENCOUNTER — Other Ambulatory Visit: Payer: Self-pay

## 2020-11-04 NOTE — Patient Instructions (Signed)
Visit Information  [name] was given information about Medicaid Managed Care team care coordination services as a part of their Healthy [facility] Medicaid benefit. Chris [name] verbally consented to engagement with the [facility] Managed Care team.   For questions related to your Healthy [facility] health plan, please call: [phone] or visit the homepage here: [email or web address]  If you would like to schedule transportation through your Healthy [facility] plan, please call the following number at least 2 days in advance of your appointment: [phone]   Call the [facility] at [phone], at any time, 24 hours a day, 7 days a week. If you are in danger or need immediate medical attention call 911.  Chris [name] - following are the goals we discussed in your visit today:  Goals Addressed   None     Please see education materials related to HTN provided as print materials.   Patient verbalizes understanding of instructions provided today.   The Managed Medicaid care management team will reach out to the patient again over the next 30 days.   [place] Constable, Pharm.D., Managed Medicaid Pharmacist [phone]   Following is a copy of your plan of care:  Patient Care Plan: Heart Failure (Adult)    Problem Identified: Symptom Exacerbation (Heart Failure)     Long-Range Goal: Symptom Exacerbation Prevented or Minimized   Start Date: 07/02/2020  Expected End Date: 12/01/2020  Recent Progress: On track  Priority: High  Note:   Current Barriers:  . Chronic Disease Management, support and educational needs related to management of multiple chronic diagnosis. Chris [name] is managing CHF, DMII, stage III CKD and chronic respiratory failure. He missed his last appointment with his PCP and cardiologist. He reports eating right, checking his blood sugar daily and taking all of his medications. He recently moved and is living  with his brother, desires his own place.[name] He reports feeling better. He is unable to focus on his health due to his unhappiness with his current living situation. He is living with his brother, but he only wants a place of his own. He is down today, denies feelings or harming himself or others.  Nurse Case Manager Clinical Goal(s):  . patient will work with Jackelyn Poling [facility] to address needs related to housing-Chris [name] reports no assistance available . patient will meet with RN Care Manager to address needs related to managing CHF and DMII-unable to focus on health management . patient will demonstrate improved health management independence as evidenced bytaking prescribed medications, reporting any changes in symptoms to PCP such as swelling, changes in breathing, new cough . Patient will work with MM pharmacist, Ovid Curd for medication management . Patient will work with Jerene Pitch, LCSW for mental health . Patient will work with Ubaldo Glassing for housing insecurity  Interventions:  . Inter-disciplinary care team collaboration (see longitudinal plan of care) . Evaluation of current treatment plan related to CHF and DMII and patient's adherence to plan as established by provider. . Advised patient to follow up with PCP appointment for 11/12/20 @ 11:10 . Encouraged patient to schedule follow up cardiology appointment . Collaborated with [name] regarding housing need . Discussed plans with patient for ongoing care management follow up and provided patient with direct contact information for care management team . Encouraged patient to have patience while looking for housing, spend time at the park or with friends . Provided therapeutic listening  Patient Goals/Self-Care  Activities Over the next 30 days, patient will: -Call to fill prescriptions one week before I run out of medication -Take all medications as  prescribed - call office if I gain more than 2 pounds in one day or 5 pounds in one week - use salt in moderation - watch for swelling in feet, ankles and legs every day  - eat more whole grains, fruits and vegetables, lean meats and healthy fats - know when to call the doctor - track symptoms and what helps feel better or worse - dress right for the weather, hot or cold   Follow Up Plan: Telephone follow up appointment with Managed Medicaid care management team member scheduled for:12/03/20 @ 3:30pm      Patient Care Plan: General Social Work (Adult)    Problem Identified: Homelessness   Onset Date: 07/09/2020  Note:   Chris [name] is a 60 y.o. year old male who sees [name], MD for primary care. The  Medicaid Managed Care team was consulted for assistance with Housing barriers. [name] was given information about Care Management services, agreed to services, and verbal consent for services was obtained.  Interventions:  . Patient interviewed and appropriate assessments performed . Collaborated with clinical team regarding patient needs  . SDOH (Social Determinants of Health) assessments performed: Yes .     [name] Provided patient with information about the [facility]. [name] contacted [facility] to complete a referral for patient to get into the homeless motel. [name] left a voicemail. [name] informed patient that the process can take a long time. Patient states he does not know why no one wants to help him. [name] informed patient he may be able to go to a shelter in [facility], patient states he does not feel safe in [place].  [name] contacted [employer] in [place] [phone] and left a voicemail for the intake coordinator. . [name] contacted [facility] to speak with a Education officer, museum but kept getting transferred.  . Patient states he does not have any money to get a room and become frustrated again stating that we "[facility]"do not want to help him. [name]  informed patient that she will wait for the [facility] and/or the [facility] to contact her back. [name] informed patient she could provide him with the information for [facility] but patient did not want to call. [name] Update 07/22/20: Patient stated he is doing okay since his surgery but still wants somewhere else to live. Patient stated he knows of a boarding house on [address] and would like SW to help him get in it. SW researched and could not find a boarding house on [address], Patient stated he would get the name of it and contact [name] with the information. [name] Update 10/14/20: [name] contacted patient to complete a follow up. Patient states he is currently living with his brother but is ready to be on his own. He states he sister has found a house for him to stay but it needs a lot of work. Patient is asking for assistance with programs that help disabled with fixing up homes. Patient states the home is in [place] and needs windows and flooring done. [name] will research programs that assists with home repairs/modeling. Patient stated he is going to the beach May 6-18. [name] will follow up with patient on May 23 at 3pm.  Plan:  . Over the next 30-60 days, patient will work with [name] to address needs related to Housing barriers . Social  Worker will continue to follow up with patient.   [name], [name], [place]  High Risk Managed Medicaid Team         Problem Identified: Depression Identification (Depression)     Long-Range Goal: Ongoing depression   Start Date: 10/07/2020  Priority: High  Note:   Timeframe:  Long-Range Goal Priority:  High Start Date:    10/07/20                        Expected End Date:  12/07/20                    Follow Up Date 10/17/20   - begin personal counseling - call and visit an old friend - check out volunteer opportunities - join a support group - laugh; watch a funny movie or comedian - learn and use visualization or  guided imagery - perform a random act of kindness - practice relaxation or meditation daily - start or continue a personal journal - talk about feelings with a friend, family or spiritual advisor - practice positive thinking and self-talk    Why is this important?    When you are stressed, down or upset, your body reacts too.   For example, your blood pressure may get higher; you may have a headache or stomachache.   When your emotions get the best of you, your body's ability to fight off cold and flu gets weak.   These steps will help you manage your emotions.   Current Barriers:  . Chronic Mental Health needs related to depression and need for housing . Limited social support, Housing barriers, and Mental Health Concerns  . Social Isolation . ADL IADL limitations . Suicidal Ideation/Homicidal Ideation: No  Clinical Social Work Goal(s):  [name] Over the next 120 days, patient will work with SW monthly by telephone or in person to reduce or manage symptoms related to depression . patient will work with [name] to address needs related to finding stable housing but patient was made aware that housing resources are very limited at this time  Interventions: . Patient interviewed and appropriate assessments performed: brief mental health assessment . PHQ 2 . PHQ 9 SDOH Interventions    Flowsheet Row Most Recent Value  SDOH Interventions   SDOH Interventions for the Following Domains Housing  Housing Interventions Other (Comment)  [Referral for housing support]  Depression Interventions/Treatment  Patient refuses Treatment    .   [name] Patient interviewed and appropriate assessments performed . Discussed plans with patient for ongoing care management follow up and provided patient with direct contact information for care management team . Assisted patient/caregiver with obtaining information about health plan benefits . Encouraged patient to consider a mental health provider for long term  follow up and therapy/counseling but patient declined. Patient reports that does not need a psychiatry referral at this time. He reports that his main source of depression is from not being able to find a place on his own. He is currently stable and residing with his brother and his brother's family in their home. Patient reports that his brother leaves for work at 3 pm during the week and gets back home around 1 am. [employer] number provided to patient. Patient reports that his sister has found him a house to relocate in. This house is inherited from the family and will need some additional work done. Patient reports that this house has  mold in it but he rather reside there instead of living with his brother. Patient is currently stable and reports no urgent concerns at this time. [name] [facility] LCSW completed care coordination with [facility] [name] on 10/07/20 who will follow up with patient this week. . Patient reports recent agitation due to his inability to secure housing on this own. [facility] LCSW provided emotional support and brief housing resource education. Patient is currently not on any public housing wait list.   . Solution-Focused Strategies, Mindfulness or Relaxation Training, Active listening / Reflection utilized , Emotional Supportive Provided, and Verbalization of feelings encouraged  . Patient decline any current substance use. He reports that his last use of cocaine was over 6 months ago. . Patient reports that he has developed social anxiety. He admits that he gets irritable at times and will snap at his loved ones and then will experience guilt afterwards. Educated patient on coping methods to implement into his daily life to combat anxiety symptoms and stress. Patient denied any current suicidal or homicidal ideations. Encouraged patient to implement deep breathing and grounding exercises into his daily routine due to ongoing anxiety and SOB.  Patient Self Care Activities:  . Ability for  insight . Independent living . Strong family or social support  Patient Coping Strengths:  . Conservator, museum/gallery . Able to Communicate Effectively  Patient Self Care Deficits:  . Lacks social connections  Depression screen [facility] 2/9 10/07/2020 11/01/2019 09/11/2019 07/09/2019 04/04/2019  Decreased Interest 2 2 0 1 3  Down, Depressed, Hopeless 2 3 1 1 3   PHQ - 2 Score 4 5 1 2 6   Altered sleeping 1 3 3  0 3  Tired, decreased energy 3 2 0 1 3  Change in appetite 0 - 0 1 0  Feeling bad or failure about yourself  2 2 0 0 3  Trouble concentrating 2 2 0 0 3  Moving slowly or fidgety/restless 0 - 0 0 3  Suicidal thoughts 0 0 0 0 0  PHQ-9 Score 12 14 4 4 21   Difficult doing work/chores Somewhat difficult - - - Extremely dIfficult     Task: Identify Depressive Symptoms and Facilitate Treatment   Note:   Care Management Activities:    - depression screen reviewed - participation in psychiatric services encouraged    Notes:    Patient Care Plan: Medication Management    Problem Identified: Health Promotion or Disease Self-Management (General Plan of Care)     Goal: Medication Management   Note:   Current Barriers:  . Does not adhere to prescribed medication regimen . Does not maintain contact with provider office . Does not contact provider office for questions/concerns .   Pharmacist Clinical Goal(s):  [name] Over the next 30 days, patient will contact provider office for questions/concerns as evidenced notation of same in electronic health record through collaboration with PharmD and provider.  .   Interventions: . Inter-disciplinary care team collaboration (see longitudinal plan of care) . Comprehensive medication review performed; medication list updated in electronic medical record  @RXCPDIABETES @ @RXCPHYPERTENSION @ @RXCPHYPERLIPIDEMIA @  Patient Goals/Self-Care Activities . Over the next 30 days, patient will:  - collaborate with provider on medication access solutions  Follow  Up Plan: The care management team will reach out to the patient again over the next 30 days.     Task: Mutually Develop and Royce Macadamia Achievement of Patient Goals   Note:   Care Management Activities:    - verbalization of feelings encouraged    Notes:

## 2020-11-04 NOTE — Patient Outreach (Signed)
Medicaid Managed Care    Pharmacy Note  11/04/2020 Name: Chris [name] MRN: [id number] DOB: [birth year]  Chris [name] is a 60 y.o. year old male who is a primary care patient of Chris Pier, MD. [facility] Managed Care Coordination team was consulted for assistance with disease management and care coordination needs.    Engaged with patient Engaged with patient by telephone for follow up visit in response to referral for case management and/or care coordination services.  Chris [name] was given information about Managed Medicaid Care Coordination team services today. Chris [name] agreed to services and verbal consent obtained.   Objective:  Lab Results  Component Value Date   CREATININE 1.61 (H) 10/29/2020   CREATININE 1.40 (H) 10/28/2020   CREATININE 1.46 (H) 10/28/2020    Lab Results  Component Value Date   HGBA1C 6.6 (H) 10/29/2020       Component Value Date/Time   CHOL 176 12/25/2019 0632   CHOL 160 11/07/2019 0918   CHOL 157 10/21/2013 0435   TRIG 91 12/25/2019 0632   TRIG 117 10/21/2013 0435   HDL 37 (L) 12/25/2019 0632   HDL 40 11/07/2019 0918   HDL 30 (L) 10/21/2013 0435   CHOLHDL 4.8 12/25/2019 0632   VLDL 18 12/25/2019 0632   VLDL 23 10/21/2013 0435   LDLCALC 121 (H) 12/25/2019 0632   LDLCALC 92 11/07/2019 0918   LDLCALC 104 (H) 10/21/2013 0435    Other: (TSH, CBC, Vit D, etc.)  Clinical ASCVD: Yes  The 10-year ASCVD risk score Chris Bussing DC Jr., et al., 2013) is: 52.8%   Values used to calculate the score:     Age: 53 years     Sex: Male     Is Non-Hispanic African American: Yes     Diabetic: Yes     Tobacco smoker: Yes     Systolic Blood Pressure: 664 mmHg     Is BP treated: Yes     HDL Cholesterol: 37 mg/dL     Total Cholesterol: 176 mg/dL    Other: (CHADS2VASc if Afib, PHQ9 if depression, MMRC or CAT for COPD, ACT, DEXA)  BP Readings from Last 3 Encounters:  10/29/20 (!) 165/98  07/17/20 126/90  07/11/20 130/80     Assessment/Interventions: Review of patient past medical history, allergies, medications, health status, including review of consultants reports, laboratory and other test data, was performed as part of comprehensive evaluation and provision of chronic care management services.   Cardio Apixaban 5mg  ASA 81mg  Carvedilol 25mg  BID Bidil Plan: At goal,  patient stable/ symptoms controlled   Lipids Atorvastatin 40mg  Plan: At goal,  patient stable/ symptoms controlled   Gout Allopurinol Cochicine Plan: At goal,  patient stable/ symptoms controlled   DM Lab Results  Component Value Date   HGBA1C 6.6 (H) 10/29/2020   HGBA1C 6.4 (H) 07/05/2020   HGBA1C 6.2 (H) 05/04/2020   Lab Results  Component Value Date   LDLCALC 121 (H) 12/25/2019   CREATININE 1.61 (H) 10/29/2020    Lab Results  Component Value Date   NA 136 10/29/2020   K 3.9 10/29/2020   CREATININE 1.61 (H) 10/29/2020   GFRNONAA 49 (L) 10/29/2020   GFRAA 42 (L) 01/14/2020   GLUCOSE 123 (H) 10/29/2020   Dapagliflozin Glipizide 5mg  Plan: At goal,  patient stable/ symptoms controlled  Meds -Patient really wants to get own housing, this is his top priority. 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 He said he doesn't want to work with people who won't work with him. I encouraged him to call the SW because housing is top priority. Will focus on meds next visit    SDOH (Social Determinants of Health) assessments and interventions performed:    Care Plan  No Known Allergies  Medications Reviewed Today    Reviewed by Lannette Donath, CPhT (Pharmacy Technician) on 10/29/20 at [place] Status: Complete  Medication Order Taking? Sig Documenting Provider Last  Dose Status Informant  Accu-Chek Softclix Lancets lancets [id number]  USE AS INSTRUCTED. INJECT INTO THE SKIN ONCE DAILY  Patient taking differently: USE AS INSTRUCTED. INJECT INTO THE SKIN ONCE DAILY   [name], NP  Active Other  albuterol (PROVENTIL) (2.5 MG/3ML) 0.083% nebulizer solution [id number] Yes TAKE 3 MLS BY NEBULIZATION EVERY 6 (SIX) HOURS AS NEEDED FOR SHORTNESS OF BREATH.  Patient taking differently: Take 2.5 mg by nebulization every 6 (six) hours as needed for shortness of breath or wheezing.   Chris Pier, MD unk Active Other           Med Note [facility], [name]   Fri Jul 11, 2020  9:19 AM)    albuterol (VENTOLIN HFA) 108 (90 Base) MCG/ACT inhaler [id number] No INHALE 2 PUFFS INTO THE LUNGS EVERY 6 (SIX) HOURS AS NEEDED FOR WHEEZING OR SHORTNESS OF BREATH.  Patient not taking: Reported on 10/29/2020   Chris Pier, MD Not Taking Unknown time Active Other           Med Note [facility]   Fri Jul 04, 2020  9:48 AM)    allopurinol (ZYLOPRIM) 100 MG tablet [id number] Yes TAKE 2 TABLETS (200 MG TOTAL) BY MOUTH DAILY.  Patient taking differently: Take 200 mg by mouth daily.   Chris Pier, MD 10/27/2020 Active Other  apixaban (ELIQUIS) 5 MG TABS tablet [id number] No TAKE 1 TABLET (5 MG TOTAL) BY MOUTH 2 (TWO) TIMES DAILY.  Patient not taking: Reported on 10/29/2020   Chris Pier, MD Not Taking Unknown time Active Other  aspirin 81 MG EC tablet [id number] Yes TAKE 1 TABLET (81 MG TOTAL) BY MOUTH DAILY.  Patient taking differently: Take 81 mg by mouth daily.   Chris Pier, MD 10/27/2020 Active Other  atorvastatin (LIPITOR) 40 MG tablet [id number] Yes TAKE 1 TABLET (40 MG TOTAL) BY MOUTH DAILY AT 6 PM.  Patient taking differently: Take 40 mg by mouth at bedtime. at [place], MD 10/27/2020 Active Other  budesonide-formoterol (SYMBICORT) 160-4.5 MCG/ACT inhaler [id number] No INHALE 2 PUFFS INTO THE LUNGS 2 (TWO) TIMES DAILY.   Patient not taking: Reported on 10/29/2020   Chris Pier, MD Not Taking Unknown time Active Other           Med Note [facility]   Fri Jul 04, 2020  9:46 AM)    carvedilol (COREG) 25 MG tablet [id number] Yes TAKE 1 TABLET (25 MG TOTAL) BY MOUTH 2 (TWO) TIMES DAILY.  Patient taking differently: Take 25 mg by mouth 2 (two) times daily.   [name], MD 10/27/2020 Active Other  colchicine 0.6 MG tablet [id number] Yes Take 2 tabs (1.2 mg) at the onset of a gout flare,  may repeat 1 tab (0.6 mg) after 2 hours if symptoms persist.  Patient taking differently: Take 0.3 mg by mouth every other day.   Charlott Rakes, MD 10/26/2020 Active Other  dapagliflozin propanediol (FARXIGA) 10 MG TABS tablet [id number] No Take 10 mg by mouth 1 (one) time each day  Patient not taking: Reported on 10/29/2020    Not Taking Unknown time Active Other  diclofenac Sodium (VOLTAREN) 1 % GEL [id number] No APPLY 2 G TOPICALLY 4 (FOUR) TIMES DAILY. USE ON ELBOW PAIN  Patient not taking: Reported on 10/29/2020   [name], MD Not Taking Unknown time Active Other  diltiazem (CARDIZEM CD) 180 MG 24 hr capsule [id number] Yes Take 180 mg by mouth daily. [provider] 10/27/2020 Active Other  fluticasone (FLONASE) 50 MCG/ACT nasal spray [id number] Yes Place 1 spray into both nostrils daily as needed for allergies or rhinitis. [name], MD 10/26/2020 Active Other  furosemide (LASIX) 40 MG tablet [id number] Yes TAKE 1 TABLET (40 MG TOTAL) BY MOUTH DAILY.  Patient taking differently: Take 40 mg by mouth daily.   Chris Pier, MD 10/27/2020 Active Other  glipiZIDE (GLUCOTROL) 5 MG tablet [id number] Yes TAKE 0.5 TABLETS (2.5 MG TOTAL) BY MOUTH 2 (TWO) TIMES DAILY BEFORE A MEAL.  Patient taking differently: Take 2.5 mg by mouth 2 (two) times daily before a meal.   Chris Pier, MD 10/27/2020 Active Other  isosorbide-hydrALAZINE (BIDIL) 20-37.5 MG tablet [id number] No TAKE 1 TABLET BY MOUTH 3  (THREE) TIMES DAILY.  Patient not taking: Reported on 10/29/2020   Chris Pier, MD Not Taking Unknown time Active Other  montelukast (SINGULAIR) 10 MG tablet [id number] No TAKE 1 TABLET (10 MG TOTAL) BY MOUTH AT BEDTIME.  Patient not taking: Reported on 10/29/2020   Chris Pier, MD Not Taking Unknown time Active Other  predniSONE (DELTASONE) 20 MG tablet [id number] No TAKE 2 TABLETS BY MOUTH DAILY FOR 3 DAYS, THEN 1.5 TABLETS FOR 3 DAYS, THEN 1 TABLET FOR 3 DAYS THEN 1/2 TABLET FOR 3 DAYS  Patient not taking: Reported on 10/29/2020   Chris Pier, MD Completed Course Unknown time Active Other  sacubitril-valsartan (ENTRESTO) 24-26 MG [id number] Yes Take 1 tablet by mouth 2 (two) times daily. [provider] 10/27/2020 Active Other  sodium polystyrene (KAYEXALATE) 15 GM/60ML suspension [id number] No TAKE 60 MLS (15 G TOTAL) BY MOUTH ONCE FOR 1 DOSE.  Patient not taking: Reported on 10/29/2020   Chris Pier, MD Completed Course Unknown time Active Other  sodium zirconium cyclosilicate (LOKELMA) 5 g packet [id number] No TAKE 5 G BY MOUTH EVERY OTHER DAY FOR 2 DOSES.  Patient not taking: Reported on 10/29/2020   [name], MD Completed Course Unknown time Active Other  triamcinolone cream (KENALOG) 0.1 % [id number] Yes Apply 1 application topically 2 (two) times daily as needed (skin irritation on face). [provider] 10/26/2020 Active Other           Med Note [place] Crane, MISTY D   Wed Oct 29, 2020 12:12 AM) 14 day course ends 10/30/20          Patient Active Problem List   Diagnosis Date Noted  . Left-sided weakness 10/28/2020  . COPD exacerbation ([place])   . Typical atrial flutter ([place])   . Acute respiratory failure with hypoxia ([place]) 07/05/2020  . CHF (congestive heart failure) ([place]) 07/04/2020  . Acute exacerbation of CHF (congestive heart failure) ([place]) 06/16/2020  . COPD with acute exacerbation ([place])  06/16/2020  . Influenza vaccine refused 05/06/2020   . Acute on chronic combined systolic (congestive) and diastolic (congestive) heart failure ([place]) 05/05/2020  . Acute decompensated heart failure ([place]) 05/04/2020  . Illiteracy 05/04/2020  . Chronic respiratory failure with hypoxia, on home oxygen therapy ([place]) 12/28/2019  . Type 2 diabetes mellitus with stage 3 chronic kidney disease ([place]) 12/25/2019  . Acute and chronic respiratory failure (acute-on-chronic) ([place]) 12/25/2019  . Acute on chronic combined systolic and diastolic CHF (congestive heart failure) ([place]) 10/26/2019  . Elevated troponin I level 10/26/2019  . Acute on chronic diastolic (congestive) heart failure ([place]) 10/26/2019  . History of gout 02/01/2019  . Seasonal allergic rhinitis due to pollen 02/01/2019  . Tobacco dependence 11/30/2018  . Microscopic hematuria 11/30/2018  . Depression 11/30/2018  . Difficulty controlling anger 11/30/2018  . CAP (community acquired pneumonia) 08/11/2018  . COPD (chronic obstructive pulmonary disease) ([place])   . CKD (chronic kidney disease) stage 3, GFR 30-59 ml/min (H[facility]) 08/10/2018  . Recurrent epistaxis 04/21/2018  . Mixed hyperlipidemia 07/28/2017  . Essential hypertension 07/28/2017  . Chronic systolic heart failure ([place]) 10/25/2014  . Cocaine abuse ([place]) 02/20/2013  . Cannabis abuse 02/20/2013  . Back pain, chronic 02/20/2013    Conditions to be addressed/monitored: HTN, Hypertriglyceridemia and DM  Care Plan : Medication Management  Updates made by Lane Hacker, [place] since 10/03/2020 12:00 AM    Problem: Health Promotion or Disease Self-Management (General Plan of Care)     Goal: Medication Management   Note:   Current Barriers:  . Does not adhere to prescribed medication regimen . Does not maintain contact with provider office . Does not contact provider office for questions/concerns .   Pharmacist Clinical Goal(s):  [name] Over the next 30 days, patient will contact provider office for questions/concerns as evidenced  notation of same in electronic health record through collaboration with PharmD and provider.  .   Interventions: . Inter-disciplinary care team collaboration (see longitudinal plan of care) . Comprehensive medication review performed; medication list updated in electronic medical record  @RXCPDIABETES @ @RXCPHYPERTENSION @ @RXCPHYPERLIPIDEMIA @  Patient Goals/Self-Care Activities . Over the next 30 days, patient will:  - collaborate with provider on medication access solutions  Follow Up Plan: The care management team will reach out to the patient again over the next 30 days.     Task: Mutually Develop and Royce Macadamia Achievement of Patient Goals   Note:   Care Management Activities:    - verbalization of feelings encouraged    Notes:      Medication Assistance: None required. Patient affirms current coverage meets needs.   Follow up: Agree   Plan: The care management team will reach out to the patient again over the next 30 days.   [place] Constable, Pharm.D., Managed Medicaid Pharmacist - [phone]

## 2020-11-06 ENCOUNTER — Other Ambulatory Visit: Payer: Self-pay

## 2020-11-06 ENCOUNTER — Other Ambulatory Visit: Payer: Self-pay | Admitting: Licensed Clinical Social Worker

## 2020-11-06 NOTE — Patient Outreach (Signed)
Medicaid Managed Care Social Work Note  11/06/2020 Name:  Chris [name] MRN:  [id number] DOB:  13-Apr-1961  Chris [name] is an 60 y.o. year old male who is a primary patient of [name], MD.  The Medicaid Managed Care Coordination team was consulted for assistance with:  Mental Health Counseling and Resources  Mr. Meixner was given information about Medicaid Managed CareCoordination services today. [name] agreed to services and verbal consent obtained.  Engaged with patient  for by telephone forfollow up visit in response to referral for case management and/or care coordination services.   Assessments/Interventions:  Review of past medical history, allergies, medications, health status, including review of consultants reports, laboratory and other test data, was performed as part of comprehensive evaluation and provision of chronic care management services.  SDOH: (Social Determinant of Health) assessments and interventions performed: SDOH Interventions   Flowsheet Row Most Recent Value  SDOH Interventions   Depression Interventions/Treatment  Referral to Psychiatry      Advanced Directives Status:  Not addressed in this encounter.  Care Plan                 No Known Allergies  Medications Reviewed Today    Reviewed by Delories Heinz, CPhT (Pharmacy Technician) on 10/29/20 at 0015  Med List Status: Complete  Medication Order Taking? Sig Documenting Provider Last Dose Status Informant  Accu-Chek Softclix Lancets lancets [id number]  USE AS INSTRUCTED. INJECT INTO THE SKIN ONCE DAILY  Patient taking differently: USE AS INSTRUCTED. INJECT INTO THE SKIN ONCE DAILY   Claiborne Rigg, NP  Active Other  albuterol (PROVENTIL) (2.5 MG/3ML) 0.083% nebulizer solution [id number] Yes TAKE 3 MLS BY NEBULIZATION EVERY 6 (SIX) HOURS AS NEEDED FOR SHORTNESS OF BREATH.  Patient taking differently: Take 2.5 mg by nebulization every 6 (six) hours as needed for shortness of breath or  wheezing.   [name], MD unk Active Other           Med Note [facility], Chris [name]   Fri Jul 11, 2020  9:19 AM)    albuterol (VENTOLIN HFA) 108 (90 Base) MCG/ACT inhaler [id number] No INHALE 2 PUFFS INTO THE LUNGS EVERY 6 (SIX) HOURS AS NEEDED FOR WHEEZING OR SHORTNESS OF BREATH.  Patient not taking: Reported on 10/29/2020   [name], MD Not Taking Unknown time Active Other           Med Note Chris [name]   Fri Jul 04, 2020  9:48 AM)    allopurinol (ZYLOPRIM) 100 MG tablet [id number] Yes TAKE 2 TABLETS (200 MG TOTAL) BY MOUTH DAILY.  Patient taking differently: Take 200 mg by mouth daily.   [name], MD 10/27/2020 Active Other  apixaban (ELIQUIS) 5 MG TABS tablet [id number] No TAKE 1 TABLET (5 MG TOTAL) BY MOUTH 2 (TWO) TIMES DAILY.  Patient not taking: Reported on 10/29/2020   [name], MD Not Taking Unknown time Active Other  aspirin 81 MG EC tablet [id number] Yes TAKE 1 TABLET (81 MG TOTAL) BY MOUTH DAILY.  Patient taking differently: Take 81 mg by mouth daily.   [name], MD 10/27/2020 Active Other  atorvastatin (LIPITOR) 40 MG tablet [id number] Yes TAKE 1 TABLET (40 MG TOTAL) BY MOUTH DAILY AT 6 PM.  Patient taking differently: Take 40 mg by mouth at bedtime. at 6pm   [name], MD 10/27/2020 Active Other  budesonide-formoterol (SYMBICORT) 160-4.5 MCG/ACT inhaler [id number] No INHALE 2 PUFFS INTO  THE LUNGS 2 (TWO) TIMES DAILY.  Patient not taking: Reported on 10/29/2020   [name], MD Not Taking Unknown time Active Other           Med Note Chris [name]   Fri Jul 04, 2020  9:46 AM)    carvedilol (COREG) 25 MG tablet [id number] Yes TAKE 1 TABLET (25 MG TOTAL) BY MOUTH 2 (TWO) TIMES DAILY.  Patient taking differently: Take 25 mg by mouth 2 (two) times daily.   Chris Odea, MD 10/27/2020 Active Other  colchicine 0.6 MG tablet [id number] Yes Take 2 tabs (1.2 mg) at the onset of a gout flare, may repeat 1 tab  (0.6 mg) after 2 hours if symptoms persist.  Patient taking differently: Take 0.3 mg by mouth every other day.   Chris Register, MD 10/26/2020 Active Other  dapagliflozin propanediol (FARXIGA) 10 MG TABS tablet [id number] No Take 10 mg by mouth 1 (one) time each day  Patient not taking: Reported on 10/29/2020    Not Taking Unknown time Active Other  diclofenac Sodium (VOLTAREN) 1 % GEL [id number] No APPLY 2 G TOPICALLY 4 (FOUR) TIMES DAILY. USE ON ELBOW PAIN  Patient not taking: Reported on 10/29/2020   Chris Barrio, MD Not Taking Unknown time Active Other  diltiazem (CARDIZEM CD) 180 MG 24 hr capsule [id number] Yes Take 180 mg by mouth daily. [provider] 10/27/2020 Active Other  fluticasone (FLONASE) 50 MCG/ACT nasal spray [id number] Yes Place 1 spray into both nostrils daily as needed for allergies or rhinitis. Chris Magnus, MD 10/26/2020 Active Other  furosemide (LASIX) 40 MG tablet [id number] Yes TAKE 1 TABLET (40 MG TOTAL) BY MOUTH DAILY.  Patient taking differently: Take 40 mg by mouth daily.   [name], MD 10/27/2020 Active Other  glipiZIDE (GLUCOTROL) 5 MG tablet [id number] Yes TAKE 0.5 TABLETS (2.5 MG TOTAL) BY MOUTH 2 (TWO) TIMES DAILY BEFORE A MEAL.  Patient taking differently: Take 2.5 mg by mouth 2 (two) times daily before a meal.   [name], MD 10/27/2020 Active Other  isosorbide-hydrALAZINE (BIDIL) 20-37.5 MG tablet [id number] No TAKE 1 TABLET BY MOUTH 3 (THREE) TIMES DAILY.  Patient not taking: Reported on 10/29/2020   [name], MD Not Taking Unknown time Active Other  montelukast (SINGULAIR) 10 MG tablet [id number] No TAKE 1 TABLET (10 MG TOTAL) BY MOUTH AT BEDTIME.  Patient not taking: Reported on 10/29/2020   [name], MD Not Taking Unknown time Active Other  predniSONE (DELTASONE) 20 MG tablet [id number] No TAKE 2 TABLETS BY MOUTH DAILY FOR 3 DAYS, THEN 1.5 TABLETS FOR 3 DAYS, THEN 1 TABLET FOR 3 DAYS THEN 1/2 TABLET FOR 3 DAYS   Patient not taking: Reported on 10/29/2020   [name], MD Completed Course Unknown time Active Other  sacubitril-valsartan (ENTRESTO) 24-26 MG [id number] Yes Take 1 tablet by mouth 2 (two) times daily. [provider] 10/27/2020 Active Other  sodium polystyrene (KAYEXALATE) 15 GM/60ML suspension [id number] No TAKE 60 MLS (15 G TOTAL) BY MOUTH ONCE FOR 1 DOSE.  Patient not taking: Reported on 10/29/2020   [name], MD Completed Course Unknown time Active Other  sodium zirconium cyclosilicate (LOKELMA) 5 g packet [id number] No TAKE 5 G BY MOUTH EVERY OTHER DAY FOR 2 DOSES.  Patient not taking: Reported on 10/29/2020   Marrion Coy, MD Completed Course Unknown time Active Other  triamcinolone cream (KENALOG) 0.1 % [id number] Yes Apply 1 application topically 2 (two) times daily  as needed (skin irritation on face). [provider] 10/26/2020 Active Other           Med Note Margo Aye, MISTY D   Wed Oct 29, 2020 12:12 AM) 14 day course ends 10/30/20          Patient Active Problem List   Diagnosis Date Noted  . Left-sided weakness 10/28/2020  . COPD exacerbation (HCC)   . Typical atrial flutter (HCC)   . Acute respiratory failure with hypoxia (HCC) 07/05/2020  . CHF (congestive heart failure) (HCC) 07/04/2020  . Acute exacerbation of CHF (congestive heart failure) (HCC) 06/16/2020  . COPD with acute exacerbation (HCC) 06/16/2020  . Influenza vaccine refused 05/06/2020  . Acute on chronic combined systolic (congestive) and diastolic (congestive) heart failure (HCC) 05/05/2020  . Acute decompensated heart failure (HCC) 05/04/2020  . Illiteracy 05/04/2020  . Chronic respiratory failure with hypoxia, on home oxygen therapy (HCC) 12/28/2019  . Type 2 diabetes mellitus with stage 3 chronic kidney disease (HCC) 12/25/2019  . Acute and chronic respiratory failure (acute-on-chronic) (HCC) 12/25/2019  . Acute on chronic combined systolic and diastolic CHF (congestive heart  failure) (HCC) 10/26/2019  . Elevated troponin I level 10/26/2019  . Acute on chronic diastolic (congestive) heart failure (HCC) 10/26/2019  . History of gout 02/01/2019  . Seasonal allergic rhinitis due to pollen 02/01/2019  . Tobacco dependence 11/30/2018  . Microscopic hematuria 11/30/2018  . Depression 11/30/2018  . Difficulty controlling anger 11/30/2018  . CAP (community acquired pneumonia) 08/11/2018  . COPD (chronic obstructive pulmonary disease) (HCC)   . CKD (chronic kidney disease) stage 3, GFR 30-59 ml/min (HCC) 08/10/2018  . Recurrent epistaxis 04/21/2018  . Mixed hyperlipidemia 07/28/2017  . Essential hypertension 07/28/2017  . Chronic systolic heart failure (HCC) 10/25/2014  . Cocaine abuse (HCC) 02/20/2013  . Cannabis abuse 02/20/2013  . Back pain, chronic 02/20/2013    Conditions to be addressed/monitored per PCP order:  Anxiety and Depression  Care Plan : General Social Work (Adult)  Updates made by Gustavus Bryant, LCSW since 11/06/2020 12:00 AM    Problem: Depression Identification (Depression)     Long-Range Goal: Ongoing depression   Start Date: 10/07/2020  Expected End Date: 12/07/2020  Priority: High  Note:   Timeframe:  Long-Range Goal Priority:  High Start Date:    10/07/20                        Expected End Date:  12/07/20                    Follow Up Date 11/25/20   - begin personal counseling - call and visit an old friend - check out volunteer opportunities - join a support group - laugh; watch a funny movie or comedian - learn and use visualization or guided imagery - perform a random act of kindness - practice relaxation or meditation daily - start or continue a personal journal - talk about feelings with a friend, family or spiritual advisor - practice positive thinking and self-talk    Why is this important?    When you are stressed, down or upset, your body reacts too.   For example, your blood pressure may get higher; you may have  a headache or stomachache.   When your emotions get the best of you, your body's ability to fight off cold and flu gets weak.   These steps will help you manage your emotions.   Current Barriers:  .  Chronic Mental Health needs related to depression and need for housing . Limited social support, Housing barriers, and Mental Health Concerns  . Social Isolation . ADL IADL limitations . Suicidal Ideation/Homicidal Ideation: No  Clinical Social Work Goal(s):  [name] Over the next 120 days, patient will work with SW monthly by telephone or in person to reduce or manage symptoms related to depression . patient will work with [name] to address needs related to finding stable housing but patient was made aware that housing resources are very limited at this time  Interventions: . Patient interviewed and appropriate assessments performed: brief mental health assessment . PHQ 2 . PHQ 9 SDOH Interventions    Flowsheet Row Most Recent Value  SDOH Interventions   SDOH Interventions for the Following Domains Housing  Housing Interventions Other (Comment)  [Referral for housing support]  Depression Interventions/Treatment  Patient refuses Treatment    .   [name] Patient interviewed and appropriate assessments performed . Discussed plans with patient for ongoing care management follow up and provided patient with direct contact information for care management team . Assisted patient/caregiver with obtaining information about health plan benefits . Encouraged patient to consider a mental health provider for long term follow up and therapy/counseling but patient declined. Patient reports that does not need a psychiatry referral at this time. He reports that his main source of depression is from not being able to find a place on his own. He is currently stable and residing with his brother and his brother's family in their home. Patient reports that his brother leaves for work at 3 pm during the week and gets back  home around 1 am. [employer] number provided to patient. Patient reports that his sister has found him a house to relocate in. This house is inherited from the family and will need some additional work done. Patient reports that this house has mold in it but he rather reside there instead of living with his brother. Patient is currently stable and reports no urgent concerns at this time. [name] [facility] LCSW completed care coordination with [facility] [name] on 11/06/20 who will follow up with patient regarding housing.  . Patient reports recent agitation due to his inability to secure housing on this own. [facility] LCSW provided emotional support and brief housing resource education. Patient is currently not on any public housing wait list.   . Solution-Focused Strategies, Mindfulness or Relaxation Training, Active listening / Reflection utilized , Emotional Supportive Provided, and Verbalization of feelings encouraged  . Patient declines any current substance use. He reports that his last use of cocaine was over 6 months ago. . Patient reports that he has developed social anxiety. He admits that he gets irritable at times and will snap at his loved ones and then will experience guilt afterwards. Educated patient on coping methods to implement into his daily life to combat anxiety symptoms and stress. Patient denied any current suicidal or homicidal ideations. Encouraged patient to implement deep breathing and grounding exercises into his daily routine due to ongoing anxiety and SOB.  . Patient is now agreeable to mental health support and referrals. [facility] LCSW placed referral for counseling and medication management through the [employer] platform. Patient was advised to expect a call from their program to initiate services.   Patient Self Care Activities:  . Ability for insight . Independent living . Strong family or social support  Patient Coping Strengths:  . Journalist, newspaper . Able to Communicate  Effectively  Patient Self Care  Deficits:  . Lacks social connections  Depression screen [facility] 2/9 10/07/2020 11/01/2019 09/11/2019 07/09/2019 04/04/2019  Decreased Interest 2 2 0 1 3  Down, Depressed, Hopeless 2 3 1 1 3   PHQ - 2 Score 4 5 1 2 6   Altered sleeping 1 3 3  0 3  Tired, decreased energy 3 2 0 1 3  Change in appetite 0 - 0 1 0  Feeling bad or failure about yourself  2 2 0 0 3  Trouble concentrating 2 2 0 0 3  Moving slowly or fidgety/restless 0 - 0 0 3  Suicidal thoughts 0 0 0 0 0  PHQ-9 Score 12 14 4 4 21   Difficult doing work/chores Somewhat difficult - - - Extremely dIfficult       Follow up:  Patient agrees to Care Plan and Follow-up.  Plan: The Managed Medicaid care management team will reach out to the patient again over the next 30 days.  Date of next scheduled Social Work care management/care coordination outreach:  11/25/20  Dickie La, [name], MSW, LCSW Managed Medicaid LCSW [facility]  [facility] [email or web address] Phone: [phone]

## 2020-11-06 NOTE — Patient Outreach (Signed)
Medicaid Managed Care Social Work Note  11/06/2020 Name:  Chris [name] MRN:  [id number] DOB:  06/07/1961  MATEI MAGNONE is an 60 y.o. year old male who is a primary patient of [name], MD.  The Medicaid Managed Care Coordination team was consulted for assistance with:  housing  Mr. Favia was given information about Medicaid Managed CareCoordination services today. [name] agreed to services and verbal consent obtained.  Engaged with patient  for by telephone forfollow up visit in response to referral for case management and/or care coordination services.   Assessments/Interventions:  Review of past medical history, allergies, medications, health status, including review of consultants reports, laboratory and other test data, was performed as part of comprehensive evaluation and provision of chronic care management services.  SDOH: (Social Determinant of Health) assessments and interventions performed:  [name] received a telephone call from patient. Patient states that he is ready to move. [name] provided patient with information for programs that may be able to help with renovations to the home in [place]. [name] informed patient that since his uncle owns the home, his uncle will have to call. [employer] [phone] and the [facility] [phone].    Advanced Directives Status:  Not addressed in this encounter.  Care Plan                 No Known Allergies  Medications Reviewed Today    Reviewed by Lannette Donath, CPhT (Pharmacy Technician) on 10/29/20 at [place] List Status: Complete  Medication Order Taking? Sig Documenting Provider Last Dose Status Informant  Accu-Chek Softclix Lancets lancets [id number]  USE AS INSTRUCTED. INJECT INTO THE SKIN ONCE DAILY  Patient taking differently: USE AS INSTRUCTED. INJECT INTO THE SKIN ONCE DAILY   [name], NP  Active Other  albuterol (PROVENTIL) (2.5 MG/3ML) 0.083% nebulizer solution  [id number] Yes TAKE 3 MLS BY NEBULIZATION EVERY 6 (SIX) HOURS AS NEEDED FOR SHORTNESS OF BREATH.  Patient taking differently: Take 2.5 mg by nebulization every 6 (six) hours as needed for shortness of breath or wheezing.   [name], MD unk Active Other           Med Note [facility], [name]   Fri Jul 11, 2020  9:19 AM)    albuterol (VENTOLIN HFA) 108 (90 Base) MCG/ACT inhaler [id number] No INHALE 2 PUFFS INTO THE LUNGS EVERY 6 (SIX) HOURS AS NEEDED FOR WHEEZING OR SHORTNESS OF BREATH.  Patient not taking: Reported on 10/29/2020   [name], MD Not Taking Unknown time Active Other           Med Note [facility] Course   Fri Jul 04, 2020  9:48 AM)    allopurinol (ZYLOPRIM) 100 MG tablet [id number] Yes TAKE 2 TABLETS (200 MG TOTAL) BY MOUTH DAILY.  Patient taking differently: Take 200 mg by mouth daily.   [name], MD 10/27/2020 Active Other  apixaban (ELIQUIS) 5 MG TABS tablet [id number] No TAKE 1 TABLET (5 MG TOTAL) BY MOUTH 2 (TWO) TIMES DAILY.  Patient not taking: Reported on 10/29/2020   [name], MD Not Taking Unknown time Active Other  aspirin 81 MG EC tablet [id number] Yes TAKE 1 TABLET (81 MG TOTAL) BY MOUTH DAILY.  Patient taking differently: Take 81 mg by mouth daily.   [name], MD 10/27/2020 Active Other  atorvastatin (LIPITOR) 40 MG tablet [id number] Yes TAKE 1 TABLET (40 MG TOTAL) BY MOUTH DAILY AT 6  PM.  Patient taking differently: Take 40 mg by mouth at bedtime. at [place], MD 10/27/2020 Active Other  budesonide-formoterol (SYMBICORT) 160-4.5 MCG/ACT inhaler [id number] No INHALE 2 PUFFS INTO THE LUNGS 2 (TWO) TIMES DAILY.  Patient not taking: Reported on 10/29/2020   [name], MD Not Taking Unknown time Active Other           Med Note [facility] Course   Fri Jul 04, 2020  9:46 AM)    carvedilol (COREG) 25 MG tablet [id number] Yes TAKE 1 TABLET (25 MG TOTAL) BY MOUTH 2 (TWO) TIMES DAILY.  Patient taking  differently: Take 25 mg by mouth 2 (two) times daily.   [name], MD 10/27/2020 Active Other  colchicine 0.6 MG tablet [id number] Yes Take 2 tabs (1.2 mg) at the onset of a gout flare, may repeat 1 tab (0.6 mg) after 2 hours if symptoms persist.  Patient taking differently: Take 0.3 mg by mouth every other day.   Charlott Rakes, MD 10/26/2020 Active Other  dapagliflozin propanediol (FARXIGA) 10 MG TABS tablet [id number] No Take 10 mg by mouth 1 (one) time each day  Patient not taking: Reported on 10/29/2020    Not Taking Unknown time Active Other  diclofenac Sodium (VOLTAREN) 1 % GEL [id number] No APPLY 2 G TOPICALLY 4 (FOUR) TIMES DAILY. USE ON ELBOW PAIN  Patient not taking: Reported on 10/29/2020   [name], MD Not Taking Unknown time Active Other  diltiazem (CARDIZEM CD) 180 MG 24 hr capsule [id number] Yes Take 180 mg by mouth daily. [provider] 10/27/2020 Active Other  fluticasone (FLONASE) 50 MCG/ACT nasal spray [id number] Yes Place 1 spray into both nostrils daily as needed for allergies or rhinitis. [name], MD 10/26/2020 Active Other  furosemide (LASIX) 40 MG tablet [id number] Yes TAKE 1 TABLET (40 MG TOTAL) BY MOUTH DAILY.  Patient taking differently: Take 40 mg by mouth daily.   [name], MD 10/27/2020 Active Other  glipiZIDE (GLUCOTROL) 5 MG tablet [id number] Yes TAKE 0.5 TABLETS (2.5 MG TOTAL) BY MOUTH 2 (TWO) TIMES DAILY BEFORE A MEAL.  Patient taking differently: Take 2.5 mg by mouth 2 (two) times daily before a meal.   [name], MD 10/27/2020 Active Other  isosorbide-hydrALAZINE (BIDIL) 20-37.5 MG tablet [id number] No TAKE 1 TABLET BY MOUTH 3 (THREE) TIMES DAILY.  Patient not taking: Reported on 10/29/2020   [name], MD Not Taking Unknown time Active Other  montelukast (SINGULAIR) 10 MG tablet [id number] No TAKE 1 TABLET (10 MG TOTAL) BY MOUTH AT BEDTIME.  Patient not taking: Reported on 10/29/2020   [name], MD Not  Taking Unknown time Active Other  predniSONE (DELTASONE) 20 MG tablet [id number] No TAKE 2 TABLETS BY MOUTH DAILY FOR 3 DAYS, THEN 1.5 TABLETS FOR 3 DAYS, THEN 1 TABLET FOR 3 DAYS THEN 1/2 TABLET FOR 3 DAYS  Patient not taking: Reported on 10/29/2020   [name], MD Completed Course Unknown time Active Other  sacubitril-valsartan (ENTRESTO) 24-26 MG [id number] Yes Take 1 tablet by mouth 2 (two) times daily. [provider] 10/27/2020 Active Other  sodium polystyrene (KAYEXALATE) 15 GM/60ML suspension [id number] No TAKE 60 MLS (15 G TOTAL) BY MOUTH ONCE FOR 1 DOSE.  Patient not taking: Reported on 10/29/2020   [name], MD Completed Course Unknown time Active Other  sodium zirconium cyclosilicate (LOKELMA) 5 g packet [id number] No TAKE 5 G BY MOUTH EVERY OTHER DAY FOR 2  DOSES.  Patient not taking: Reported on 10/29/2020   [name], MD Completed Course Unknown time Active Other  triamcinolone cream (KENALOG) 0.1 % [id number] Yes Apply 1 application topically 2 (two) times daily as needed (skin irritation on face). [provider] 10/26/2020 Active Other           Med Note [place] Crane, MISTY D   Wed Oct 29, 2020 12:12 AM) 14 day course ends 10/30/20          Patient Active Problem List   Diagnosis Date Noted  . Left-sided weakness 10/28/2020  . COPD exacerbation ([place])   . Typical atrial flutter ([place])   . Acute respiratory failure with hypoxia ([place]) 07/05/2020  . CHF (congestive heart failure) ([place]) 07/04/2020  . Acute exacerbation of CHF (congestive heart failure) ([place]) 06/16/2020  . COPD with acute exacerbation ([place]) 06/16/2020  . Influenza vaccine refused 05/06/2020  . Acute on chronic combined systolic (congestive) and diastolic (congestive) heart failure ([place]) 05/05/2020  . Acute decompensated heart failure ([place]) 05/04/2020  . Illiteracy 05/04/2020  . Chronic respiratory failure with hypoxia, on home oxygen therapy ([place]) 12/28/2019  . Type 2 diabetes mellitus  with stage 3 chronic kidney disease ([place]) 12/25/2019  . Acute and chronic respiratory failure (acute-on-chronic) ([place]) 12/25/2019  . Acute on chronic combined systolic and diastolic CHF (congestive heart failure) ([place]) 10/26/2019  . Elevated troponin I level 10/26/2019  . Acute on chronic diastolic (congestive) heart failure ([place]) 10/26/2019  . History of gout 02/01/2019  . Seasonal allergic rhinitis due to pollen 02/01/2019  . Tobacco dependence 11/30/2018  . Microscopic hematuria 11/30/2018  . Depression 11/30/2018  . Difficulty controlling anger 11/30/2018  . CAP (community acquired pneumonia) 08/11/2018  . COPD (chronic obstructive pulmonary disease) ([place])   . CKD (chronic kidney disease) stage 3, GFR 30-59 ml/min (HCC) 08/10/2018  . Recurrent epistaxis 04/21/2018  . Mixed hyperlipidemia 07/28/2017  . Essential hypertension 07/28/2017  . Chronic systolic heart failure ([place]) 10/25/2014  . Cocaine abuse ([place]) 02/20/2013  . Cannabis abuse 02/20/2013  . Back pain, chronic 02/20/2013    Conditions to be addressed/monitored per PCP order:  housing  Care Plan : General Social Work (Adult)  Updates made by Ethelda Chick since 11/06/2020 12:00 AM    Problem: Homelessness   Onset Date: 07/09/2020  Note:   Chris [name] is a 60 y.o. year old male who sees [name], MD for primary care. The  Medicaid Managed Care team was consulted for assistance with Housing barriers. Mr. Ballin was given information about Care Management services, agreed to services, and verbal consent for services was obtained.  Interventions:  . Patient interviewed and appropriate assessments performed . Collaborated with clinical team regarding patient needs  . SDOH (Social Determinants of Health) assessments performed: Yes .     [name] Provided patient with information about the [facility]. [name] contacted [facility] to complete a referral for patient to get into the homeless motel. [name] left a  voicemail. [name] informed patient that the process can take a long time. Patient states he does not know why no one wants to help him. [name] informed patient he may be able to go to a shelter in [facility], patient states he does not feel safe in [place].  [name] contacted [employer] in [place] [phone] and left a voicemail for the intake coordinator. . [name] contacted [facility] to speak with a Education officer, museum but kept getting transferred.  . Patient states he does not  have any money to get a room and become frustrated again stating that we "[place]"do not want to help him. [name] informed patient that she will wait for the [facility] and/or the [facility] to contact her back. [name] informed patient she could provide him with the information for [facility] but patient did not want to call. [name] Update 07/22/20: Patient stated he is doing okay since his surgery but still wants somewhere else to live. Patient stated he knows of a boarding house on [place] and would like SW to help him get in it. SW researched and could not find a boarding house on [place], Patient stated he would get the name of it and contact [name] with the information. [name] Update 10/14/20: [name] contacted patient to complete a follow up. Patient states he is currently living with his brother but is ready to be on his own. He states he sister has found a house for him to stay but it needs a lot of work. Patient is asking for assistance with programs that help disabled with fixing up homes. Patient states the home is in [place] and needs windows and flooring done. [name] will research programs that assists with home repairs/modeling. Patient stated he is going to the beach May 6-18. [name] will follow up with patient on May 23 at 3pm. . Update 5/26: [name] received a telephone call from patient. Patient states that he is ready to move. [name] provided patient with information for programs that may be able to help with renovations to the home  in [place]. [name] informed patient that since his uncle owns the home, his uncle will have to call. [employer] [phone] and the [facility] [phone].  Plan:  . Over the next 30-60 days, patient will work with [name] to address needs related to Housing barriers . Social Worker will continue to follow up with patient.   [name], [name], [place]  High Risk Managed Medicaid Team           Follow up:  Patient agrees to Care Plan and Follow-up.  Plan: The Managed Medicaid care management team will reach out to the patient again over the next 30 days.  Date/time of next scheduled Social Work care management/care coordination outreach:  11/21/20 [name], Arita Miss, [place] Medicaid Team  [phone]

## 2020-11-06 NOTE — Patient Instructions (Signed)
Visit Information  [name] was given information about Medicaid Managed Care team care coordination services as a part of their Healthy [facility] Medicaid benefit. [name] verbally consented to engagement with the [facility] Managed Care team.   For questions related to your Healthy [facility] health plan, please call: [phone] or visit the homepage here: [email or web address]  If you would like to schedule transportation through your Healthy [facility] plan, please call the following number at least 2 days in advance of your appointment: [phone]   Call the [facility] at [phone], at any time, 24 hours a day, 7 days a week. If you are in danger or need immediate medical attention call 911.  [name] - following are the goals we discussed in your visit today:  Goals Addressed   None     Social Worker will follow up in 14 days.   [name], [name], [place] Managed Medicaid Team  [phone]  Following is a copy of your plan of care:

## 2020-11-06 NOTE — Patient Instructions (Signed)
Visit Information  Mr. Andrepont was given information about Medicaid Managed Care team care coordination services as a part of their Healthy [facility] Medicaid benefit. TALLAN SANDOZ verbally consented to engagement with the [facility] Managed Care team.   For questions related to your Healthy [facility] health plan, please call: [phone] or visit the homepage here: [email or web address]  If you would like to schedule transportation through your Healthy [facility] plan, please call the following number at least 2 days in advance of your appointment: [phone]   Call the [facility] at [phone], at any time, 24 hours a day, 7 days a week. If you are in danger or need immediate medical attention call 911.  Mr. Bloodgood - following are the goals we discussed in your visit today:  Goals Addressed            This Visit's Progress   . Manage My Emotions       Timeframe:  Long-Range Goal Priority:  High Start Date:    10/07/20                        Expected End Date:  12/07/20                    Follow Up Date 11/25/20   - begin personal counseling - call and visit an old friend - check out volunteer opportunities - join a support group - laugh; watch a funny movie or comedian - learn and use visualization or guided imagery - perform a random act of kindness - practice relaxation or meditation daily - start or continue a personal journal - talk about feelings with a friend, family or spiritual advisor - practice positive thinking and self-talk    Why is this important?    When you are stressed, down or upset, your body reacts too.   For example, your blood pressure may get higher; you may have a headache or stomachache.   When your emotions get the best of you, your body's ability to fight off cold and flu gets weak.   These steps will help you manage your emotions.   Current Barriers:  . Chronic Mental Health  needs related to depression and need for housing . Limited social support, Housing barriers, and Mental Health Concerns  . Social Isolation . ADL IADL limitations . Suicidal Ideation/Homicidal Ideation: No  Clinical Social Work Goal(s):  [name] Over the next 120 days, patient will work with SW monthly by telephone or in person to reduce or manage symptoms related to depression . patient will work with [name] to address needs related to finding stable housing but patient was made aware that housing resources are very limited at this time  Interventions: . Patient interviewed and appropriate assessments performed: brief mental health assessment . PHQ 2 . PHQ 9 SDOH Interventions    Flowsheet Row Most Recent Value  SDOH Interventions   SDOH Interventions for the Following Domains Housing  Housing Interventions Other (Comment)  [Referral for housing support]  Depression Interventions/Treatment  Patient refuses Treatment    .   [name] Patient interviewed and appropriate assessments performed . Discussed plans with patient for ongoing care management follow up and provided patient with direct contact information for care management team . Assisted patient/caregiver with obtaining information about health plan benefits . Encouraged patient to consider a mental health provider for long term follow up and therapy/counseling but patient declined. Patient reports that does not  need a psychiatry referral at this time. He reports that his main source of depression is from not being able to find a place on his own. He is currently stable and residing with his brother and his brother's family in their home. Patient reports that his brother leaves for work at 3 pm during the week and gets back home around 1 am. [employer] number provided to patient. Patient reports that his sister has found him a house to relocate in. This house is inherited from the family and will need some additional work done.  Patient reports that this house has mold in it but he rather reside there instead of living with his brother. Patient is currently stable and reports no urgent concerns at this time. [name] [facility] LCSW completed care coordination with [facility] [name] on 10/07/20 who will follow up with patient this week. . Patient reports recent agitation due to his inability to secure housing on this own. [facility] LCSW provided emotional support and brief housing resource education. Patient is currently not on any public housing wait list.   . Solution-Focused Strategies, Mindfulness or Relaxation Training, Active listening / Reflection utilized , Emotional Supportive Provided, and Verbalization of feelings encouraged  . Patient decline any current substance use. He reports that his last use of cocaine was over 6 months ago. . Patient reports that he has developed social anxiety. He admits that he gets irritable at times and will snap at his loved ones and then will experience guilt afterwards. Educated patient on coping methods to implement into his daily life to combat anxiety symptoms and stress. Patient denied any current suicidal or homicidal ideations. Encouraged patient to implement deep breathing and grounding exercises into his daily routine due to ongoing anxiety and SOB.  Patient Self Care Activities:  . Ability for insight . Independent living . Strong family or social support  Patient Coping Strengths:  . Conservator, museum/gallery . Able to Communicate Effectively  Patient Self Care Deficits:  . Lacks social connections  Depression screen [facility] 2/9 10/07/2020 11/01/2019 09/11/2019 07/09/2019 04/04/2019  Decreased Interest 2 2 0 1 3  Down, Depressed, Hopeless 2 3 1 1 3   PHQ - 2 Score 4 5 1 2 6   Altered sleeping 1 3 3  0 3  Tired, decreased energy 3 2 0 1 3  Change in appetite 0 - 0 1 0  Feeling bad or failure about yourself  2 2 0 0 3  Trouble concentrating 2 2 0 0 3  Moving slowly or fidgety/restless 0 - 0 0 3  Suicidal thoughts 0  0 0 0 0  PHQ-9 Score 12 14 4 4 21   Difficult doing work/chores Somewhat difficult - - - Extremely dIfficult        Eula Fried, [name], MSW, [employer] Managed Medicaid LCSW [email or web address] Phone: [phone]

## 2020-11-11 ENCOUNTER — Other Ambulatory Visit: Payer: Self-pay

## 2020-11-11 DIAGNOSIS — E1122 Type 2 diabetes mellitus with diabetic chronic kidney disease: Secondary | ICD-10-CM | POA: Diagnosis not present

## 2020-11-11 DIAGNOSIS — N1831 Chronic kidney disease, stage 3a: Secondary | ICD-10-CM | POA: Diagnosis not present

## 2020-11-11 DIAGNOSIS — I5022 Chronic systolic (congestive) heart failure: Secondary | ICD-10-CM | POA: Diagnosis not present

## 2020-11-11 DIAGNOSIS — I10 Essential (primary) hypertension: Secondary | ICD-10-CM | POA: Diagnosis not present

## 2020-11-11 DIAGNOSIS — R809 Proteinuria, unspecified: Secondary | ICD-10-CM | POA: Diagnosis not present

## 2020-11-11 MED ORDER — LOSARTAN POTASSIUM 100 MG PO TABS
ORAL_TABLET | ORAL | 3 refills | Status: DC
  Filled 2020-11-11 (×2): qty 90, 90d supply, fill #0

## 2020-11-12 ENCOUNTER — Ambulatory Visit (HOSPITAL_COMMUNITY)
Admission: EM | Admit: 2020-11-12 | Discharge: 2020-11-12 | Disposition: A | Discharge status: Home / Self Care | Payer: Medicaid Other

## 2020-11-12 ENCOUNTER — Ambulatory Visit: Payer: Medicaid Other | Attending: Internal Medicine | Admitting: Physician Assistant

## 2020-11-12 ENCOUNTER — Other Ambulatory Visit: Payer: Self-pay

## 2020-11-12 ENCOUNTER — Ambulatory Visit: Payer: Medicaid Other

## 2020-11-12 ENCOUNTER — Encounter: Payer: Self-pay | Admitting: Physician Assistant

## 2020-11-12 VITALS — BP 185/101 | HR 85 | Resp 16 | Wt 271.2 lb

## 2020-11-12 DIAGNOSIS — I10 Essential (primary) hypertension: Secondary | ICD-10-CM

## 2020-11-12 DIAGNOSIS — J439 Emphysema, unspecified: Secondary | ICD-10-CM | POA: Diagnosis not present

## 2020-11-12 DIAGNOSIS — M109 Gout, unspecified: Secondary | ICD-10-CM

## 2020-11-12 DIAGNOSIS — G8929 Other chronic pain: Secondary | ICD-10-CM

## 2020-11-12 DIAGNOSIS — E1169 Type 2 diabetes mellitus with other specified complication: Secondary | ICD-10-CM

## 2020-11-12 DIAGNOSIS — J301 Allergic rhinitis due to pollen: Secondary | ICD-10-CM

## 2020-11-12 DIAGNOSIS — I5042 Chronic combined systolic (congestive) and diastolic (congestive) heart failure: Secondary | ICD-10-CM

## 2020-11-12 DIAGNOSIS — E669 Obesity, unspecified: Secondary | ICD-10-CM

## 2020-11-12 DIAGNOSIS — Z09 Encounter for follow-up examination after completed treatment for conditions other than malignant neoplasm: Secondary | ICD-10-CM

## 2020-11-12 DIAGNOSIS — R531 Weakness: Secondary | ICD-10-CM

## 2020-11-12 DIAGNOSIS — Z20822 Contact with and (suspected) exposure to covid-19: Secondary | ICD-10-CM

## 2020-11-12 DIAGNOSIS — F1910 Other psychoactive substance abuse, uncomplicated: Secondary | ICD-10-CM | POA: Diagnosis not present

## 2020-11-12 DIAGNOSIS — E785 Hyperlipidemia, unspecified: Secondary | ICD-10-CM

## 2020-11-12 DIAGNOSIS — M5441 Lumbago with sciatica, right side: Secondary | ICD-10-CM

## 2020-11-12 DIAGNOSIS — M5442 Lumbago with sciatica, left side: Secondary | ICD-10-CM

## 2020-11-12 LAB — GLUCOSE, POCT (MANUAL RESULT ENTRY): POC Glucose: 137 mg/dl — AB (ref 70–99)

## 2020-11-12 MED ORDER — ATORVASTATIN CALCIUM 40 MG PO TABS
ORAL_TABLET | Freq: Every day | ORAL | 2 refills | Status: DC
  Filled 2020-11-12: qty 30, 30d supply, fill #0

## 2020-11-12 MED ORDER — COLCHICINE 0.6 MG PO TABS
ORAL_TABLET | ORAL | 1 refills | Status: DC
Start: 2020-11-12 — End: 2020-11-30
  Filled 2020-11-12: qty 30, 10d supply, fill #0

## 2020-11-12 MED ORDER — FLUTICASONE PROPIONATE 50 MCG/ACT NA SUSP
1.0000 | Freq: Every day | NASAL | 2 refills | Status: DC | PRN
  Filled 2020-11-12: qty 16, 30d supply, fill #0

## 2020-11-12 MED ORDER — MONTELUKAST SODIUM 10 MG PO TABS
ORAL_TABLET | Freq: Every day | ORAL | 2 refills | Status: DC
  Filled 2020-11-12: qty 30, 30d supply, fill #0

## 2020-11-12 MED ORDER — LOSARTAN POTASSIUM 100 MG PO TABS
ORAL_TABLET | ORAL | 3 refills | Status: DC
Start: 2020-11-12 — End: 2020-11-30

## 2020-11-12 MED ORDER — ALBUTEROL SULFATE HFA 108 (90 BASE) MCG/ACT IN AERS
2.0000 | INHALATION_SPRAY | Freq: Four times a day (QID) | RESPIRATORY_TRACT | 2 refills | Status: DC | PRN
  Filled 2020-11-12: qty 8.5, 25d supply, fill #0

## 2020-11-12 MED ORDER — FUROSEMIDE 40 MG PO TABS
ORAL_TABLET | Freq: Every day | ORAL | 2 refills | Status: DC
  Filled 2020-11-12: qty 90, 90d supply, fill #0

## 2020-11-12 MED ORDER — ALLOPURINOL 100 MG PO TABS
ORAL_TABLET | Freq: Every day | ORAL | 4 refills | Status: DC
  Filled 2020-11-12: qty 60, 30d supply, fill #0

## 2020-11-12 MED ORDER — ASPIRIN 81 MG PO TBEC
DELAYED_RELEASE_TABLET | Freq: Every day | ORAL | 2 refills | Status: DC
  Filled 2020-11-12: qty 30, fill #0

## 2020-11-12 MED ORDER — DAPAGLIFLOZIN PROPANEDIOL 10 MG PO TABS
ORAL_TABLET | ORAL | 11 refills | Status: DC
  Filled 2020-11-12 (×2): qty 30, 30d supply, fill #0

## 2020-11-12 MED ORDER — APIXABAN 5 MG PO TABS
ORAL_TABLET | Freq: Two times a day (BID) | ORAL | 3 refills | Status: DC
  Filled 2020-11-12: qty 60, 30d supply, fill #0

## 2020-11-12 MED ORDER — DILTIAZEM HCL ER COATED BEADS 180 MG PO CP24
180.0000 mg | ORAL_CAPSULE | Freq: Every day | ORAL | 1 refills | Status: DC
  Filled 2020-11-12: qty 90, 90d supply, fill #0

## 2020-11-12 MED ORDER — BUDESONIDE-FORMOTEROL FUMARATE 160-4.5 MCG/ACT IN AERO
2.0000 | INHALATION_SPRAY | Freq: Two times a day (BID) | RESPIRATORY_TRACT | 2 refills | Status: DC
  Filled 2020-11-12: qty 10.2, 30d supply, fill #0

## 2020-11-12 MED ORDER — GLIPIZIDE 5 MG PO TABS
2.5000 mg | ORAL_TABLET | Freq: Two times a day (BID) | ORAL | 2 refills | Status: DC
  Filled 2020-11-12: qty 60, 60d supply, fill #0

## 2020-11-12 MED ORDER — TRIAMCINOLONE ACETONIDE 0.1 % EX CREA
1.0000 "application " | TOPICAL_CREAM | Freq: Two times a day (BID) | CUTANEOUS | 1 refills | Status: DC | PRN
  Filled 2020-11-12: qty 30, 15d supply, fill #0

## 2020-11-12 MED ORDER — ALBUTEROL SULFATE (2.5 MG/3ML) 0.083% IN NEBU
INHALATION_SOLUTION | RESPIRATORY_TRACT | 1 refills | Status: DC
  Filled 2020-11-12: qty 90, 7d supply, fill #0

## 2020-11-12 MED ORDER — CARVEDILOL 25 MG PO TABS
ORAL_TABLET | Freq: Two times a day (BID) | ORAL | 3 refills | Status: DC
  Filled 2020-11-12: qty 60, 30d supply, fill #0

## 2020-11-12 NOTE — Progress Notes (Signed)
Patient ID: Chris [name], male   DOB: June 06, 1961, 60 y.o.   MRN: [id number]   Chris [name], is a 60 y.o. male  [id number]  [id number]  DOB - 1961/02/12  Subjective:  Chief Complaint and HPI: Chris [name] is a 60 y.o. male here today for a follow up visit from being seen in the [name] for L sided weakness that has resolved, med RF,  and pain management referral.  He saw nephrology yesterday.  His medications are very confusing and he is a poor historian.  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  No program of recovery.  He never started entresto.  He quit taking bildil about one month ago bc it was causing him HA.  He has not seen a cardiologist since january.  cardizem was canceled at that visit  I met with him and pharmacy met with him to decipher his medications using hospital notes and nephrology note from yesterday.  He does have empty bottles with him.  He refuses in office clonidine despite R/B being reviewed.    After recent hospitalization 5/17-5/18/2022 and saw neprology in follow-up 11/11/2020: Brief/Interim Summary: Chris [name] with complaints ofleft sided weakness and seen by neuro andrecommended that he be admitted for echocardiogram and EEG. And also for optimal blood pressure controlled. HPI is limited as patient is nonverbal. Pt is now speaking and reports that Earlier he was not able to speak and last night he almost passed out and he did not see doctor last night and today am when he was not able to get up they brought him here. Pt has past medical history ofatrial flutter, CHF, COPD, CAD, diabetes mellitus type 2, hypertension, gout, influenza A,tobacco abuse,reduced ejection fraction congestive heart failure with a EF of 25% and global hypokinesis based on echocardiogram in January 2022.  Patient admitted as above with complaints of left-sided weakness, negative neurology work-up at this time,  he has had no further episodes or issues of near syncope or syncope.  Remains unclear with true etiology of patient's symptoms or what his symptoms truly were given questionable episodes of weakness syncope near syncope with what appears to be a somewhat transitioning history depending on provider.  At 1 point he was nonverbal during his admission.  Regardless patient is now back to baseline requesting discharge home which certainly reasonable.  Close outpatient follow-up as outlined above, no medication changes at discharge.  Discharge Diagnoses:  Active Problems:   Left-sided weakness   From [facility] 11/11/2020: Impression/Recommendations  Chris [name] is a 60 y.o. 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   1. Diabetes mellitus type 2 with chronic kidney disease/chronic kidney disease stage IIIb/proteinuria. At last visit we initiated work-up of underlying chronic kidney disease. Patient had a negative SPEP/UPEP/ANA/ANCA/GBM antibodies. Renal function was slightly improved at last visit with an EGFR of 52 with a urine protein creatinine ratio 0.6. Maintain the patient on losartan as well as Farxiga for limiting progression of underlying chronic kidney disease. Hemoglobin A1c appears to be at target at 6.6. Follow-up renal parameters today.  2. Hypertension. Blood pressure elevated at 150/98. Maintain current doses of amlodipine, carvedilol, BiDil. Increase losartan to 100  mg daily.  3. Chronic systolic heart failure. Patient be maintained on ARB, beta-blocker, furosemide, and Farxiga for underlying chronic systolic heart failure. Marcelline Deist does have indication for heart failure.  Return  in about 4 months (around 03/13/2021).   [name]/Hospital notes reviewed.     ROS:   Constitutional:  No f/c, No night sweats, No unexplained weight loss. EENT:  No vision changes, No blurry vision, No hearing changes. No mouth, throat, or ear problems.  Respiratory: No cough, some SOB Cardiac: No CP, no palpitations GI:  No abd pain, No N/V/D. GU: No Urinary s/sx Musculoskeletal: +chronic pain Neuro: No headache, no dizziness, no motor weakness.  Skin: No rash Endocrine:  No polydipsia. No polyuria.  Psych: Denies SI/HI  No problems updated.  ALLERGIES: No Known Allergies  PAST MEDICAL HISTORY: Past Medical History:  Diagnosis Date  . Arrhythmia    atrial flutter  . CHF (congestive heart failure) (HCC)   . Chronic kidney disease   . COPD (chronic obstructive pulmonary disease) (HCC)   . Coronary artery disease   . Depression   . Diabetes mellitus without complication (HCC)   . GERD (gastroesophageal reflux disease)   . Gout   . Hypertension   . Influenza A with respiratory manifestations   . Mental disorder     MEDICATIONS AT HOME: Prior to Admission medications   Medication Sig Start Date End Date Taking? Authorizing Provider  Accu-Chek Softclix Lancets lancets USE AS INSTRUCTED. INJECT INTO THE SKIN ONCE DAILY Patient taking differently: USE AS INSTRUCTED. INJECT INTO THE SKIN ONCE DAILY 11/13/19 11/12/20  Claiborne Rigg, NP  albuterol (PROVENTIL) (2.5 MG/3ML) 0.083% nebulizer solution TAKE 3 MLS BY NEBULIZATION EVERY 6 (SIX) HOURS AS NEEDED FOR SHORTNESS OF BREATH. 11/12/20 11/12/21  Anders Simmonds, PA-C  albuterol (VENTOLIN HFA) 108 (90 Base) MCG/ACT inhaler INHALE 2 PUFFS INTO THE LUNGS EVERY 6 (SIX) HOURS AS NEEDED FOR WHEEZING OR SHORTNESS OF BREATH. 11/12/20 11/12/21  Anders Simmonds, PA-C  allopurinol (ZYLOPRIM) 100 MG tablet TAKE 2 TABLETS (200 MG TOTAL) BY MOUTH DAILY. 11/12/20 11/12/21  Anders Simmonds, PA-C  apixaban (ELIQUIS) 5 MG TABS tablet TAKE 1 TABLET (5 MG  TOTAL) BY MOUTH 2 (TWO) TIMES DAILY. 11/12/20 11/12/21  Anders Simmonds, PA-C  aspirin 81 MG EC tablet TAKE 1 TABLET (81 MG TOTAL) BY MOUTH DAILY. 11/12/20 11/12/21  Anders Simmonds, PA-C  atorvastatin (LIPITOR) 40 MG tablet TAKE 1 TABLET (40 MG TOTAL) BY MOUTH DAILY AT 6 PM. 11/12/20 11/12/21  Emillee Talsma, Marzella Schlein, PA-C  budesonide-formoterol (SYMBICORT) 160-4.5 MCG/ACT inhaler INHALE 2 PUFFS INTO THE LUNGS 2 (TWO) TIMES DAILY. 11/12/20 11/12/21  Anders Simmonds, PA-C  carvedilol (COREG) 25 MG tablet TAKE 1 TABLET (25 MG TOTAL) BY MOUTH 2 (TWO) TIMES DAILY. 11/12/20 11/12/21  Anders Simmonds, PA-C  colchicine 0.6 MG tablet Take 2 tabs (1.2 mg) at the onset of a gout flare, may repeat 1 tab (0.6 mg) after 2 hours if symptoms persist. 11/12/20   Anders Simmonds, PA-C  dapagliflozin propanediol (FARXIGA) 10 MG TABS tablet Take 10 mg by mouth 1 (one) time each day 11/12/20   Anders Simmonds, PA-C  diclofenac Sodium (VOLTAREN) 1 % GEL APPLY 2 G TOPICALLY 4 (FOUR) TIMES DAILY. USE ON ELBOW PAIN Patient not taking: No sig reported 05/05/20 05/05/21  [name], MD  diltiazem (CARDIZEM CD) 180 MG 24 hr capsule Take 1 capsule (180 mg total) by mouth daily. 11/12/20   Georgian Co  M, PA-C  fluticasone (FLONASE) 50 MCG/ACT nasal spray Place 1 spray into both nostrils daily as needed for allergies or rhinitis. 11/12/20   Anders Simmonds, PA-C  furosemide (LASIX) 40 MG tablet TAKE 1 TABLET (40 MG TOTAL) BY MOUTH DAILY. 11/12/20 11/12/21  Anders Simmonds, PA-C  glipiZIDE (GLUCOTROL) 5 MG tablet Take 0.5 tablets (2.5 mg total) by mouth 2 (two) times daily before a meal. 11/12/20 11/12/21  Tremane Spurgeon, Marzella Schlein, PA-C  isosorbide-hydrALAZINE (BIDIL) 20-37.5 MG tablet TAKE 1 TABLET BY MOUTH 3 (THREE) TIMES DAILY. Patient not taking: No sig reported 07/01/20 07/01/21  [name], MD  losartan (COZAAR) 100 MG tablet Take 1 tablet (100 mg total) by mouth 1 (one) time each day 11/12/20   Anders Simmonds, PA-C  montelukast (SINGULAIR)  10 MG tablet TAKE 1 TABLET (10 MG TOTAL) BY MOUTH AT BEDTIME. 11/12/20 11/12/21  Ella Golomb, Marzella Schlein, PA-C  sacubitril-valsartan (ENTRESTO) 24-26 MG Take 1 tablet by mouth 2 (two) times daily. Patient not taking: Reported on 11/12/2020    [provider]  sodium polystyrene (KAYEXALATE) 15 GM/60ML suspension TAKE 60 MLS (15 G TOTAL) BY MOUTH ONCE FOR 1 DOSE. Patient not taking: No sig reported 07/10/20 07/10/21  [name], MD  sodium zirconium cyclosilicate (LOKELMA) 5 g packet TAKE 5 G BY MOUTH EVERY OTHER DAY FOR 2 DOSES. Patient not taking: No sig reported 07/09/20 07/09/21  Marrion Coy, MD  triamcinolone cream (KENALOG) 0.1 % Apply 1 application topically 2 (two) times daily as needed (skin irritation on face). 11/12/20   Anders Simmonds, PA-C  metoprolol tartrate (LOPRESSOR) 100 MG tablet Take 1 tablet (100 mg total) by mouth 2 (two) times daily. 07/09/20 07/11/20  Marrion Coy, MD     Objective:  EXAM:   Vitals:   11/12/20 1018  BP: (!) 185/101  Pulse: 85  Resp: 16  SpO2: 95%  Weight: 271 lb 3.2 oz (123 kg)    General appearance : A&OX3. NAD. Non-toxic-appearing HEENT: Atraumatic and Normocephalic.  PERRLA. EOM intact.  TM clear B. Mouth-MMM, post pharynx WNL w/o erythema, No PND. Neck: supple, no JVD. No cervical lymphadenopathy. No thyromegaly Chest/Lungs:  Breathing-non-labored, Good air entry bilaterally, breath sounds without rales or rhonchi.  There is mild wheezing  throughout CVS: S1 S2 regular, no murmurs, gallops, rubs  Abdomen: Bowel sounds present, Non tender and not distended with no gaurding, rigidity or rebound. Extremities: Bilateral Lower Ext shows no edema, both legs are warm to touch with = pulse throughout Neurology:  CN II-XII grossly intact, Non focal.   Psych:  TP unorganized. J/I poor to fair. Normal speech. Appropriate eye contact and blunted affect.  Skin:  No Rash  Data Review Lab Results  Component Value Date   HGBA1C 6.6 (H) 10/29/2020    HGBA1C 6.4 (H) 07/05/2020   HGBA1C 6.2 (H) 05/04/2020     Assessment & Plan   1. Type 2 diabetes mellitus with obesity (HCC) A1C=6.6 2 weeks ago.  Admits to poor diet.  I have had a lengthy discussion and provided education about insulin resistance and the intake of too much sugar/refined carbohydrates.  I have advised the patient to work at a goal of eliminating sugary drinks, candy, desserts, sweets, refined sugars, processed foods, and white carbohydrates.  The patient expresses understanding.  - Glucose (CBG) - glipiZIDE (GLUCOTROL) 5 MG tablet; Take 0.5 tablets (2.5 mg total) by mouth 2 (two) times daily before a meal.  Dispense: 60 tablet; Refill: 2 -  dapagliflozin propanediol (FARXIGA) 10 MG TABS tablet; Take 10 mg by mouth 1 (one) time each day  Dispense: 30 tablet; Refill: 11 - Ambulatory referral to Cardiology  2. Pulmonary emphysema, unspecified emphysema type (HCC) - budesonide-formoterol (SYMBICORT) 160-4.5 MCG/ACT inhaler; INHALE 2 PUFFS INTO THE LUNGS 2 (TWO) TIMES DAILY.  Dispense: 10.2 g; Refill: 2 - montelukast (SINGULAIR) 10 MG tablet; TAKE 1 TABLET (10 MG TOTAL) BY MOUTH AT BEDTIME.  Dispense: 30 tablet; Refill: 2 - albuterol (VENTOLIN HFA) 108 (90 Base) MCG/ACT inhaler; INHALE 2 PUFFS INTO THE LUNGS EVERY 6 (SIX) HOURS AS NEEDED FOR WHEEZING OR SHORTNESS OF BREATH.  Dispense: 8.5 g; Refill: 2 - albuterol (PROVENTIL) (2.5 MG/3ML) 0.083% nebulizer solution; TAKE 3 MLS BY NEBULIZATION EVERY 6 (SIX) HOURS AS NEEDED FOR SHORTNESS OF BREATH.  Dispense: 90 mL; Refill: 1  3. Left-sided weakness Resolved - Ambulatory referral to Cardiology  4. Encounter for examination following treatment at hospital  5. Polysubstance abuse (HCC) currently not using illicit substances for 4 months. [name] is the website for narcotics anonymous [employer] (website) or 709-506-7875 is the information for alcoholics anonymous Both are free and immediately available for help with alcohol  and drug use   6. Chronic bilateral low back pain with bilateral sciatica - Ambulatory referral to Pain Clinic  7. Seasonal allergic rhinitis due to pollen - triamcinolone cream (KENALOG) 0.1 %; Apply 1 application topically 2 (two) times daily as needed (skin irritation on face).  Dispense: 30 g; Refill: 1 - fluticasone (FLONASE) 50 MCG/ACT nasal spray; Place 1 spray into both nostrils daily as needed for allergies or rhinitis.  Dispense: 16 g; Refill: 2  8. Hyperlipidemia associated with type 2 diabetes mellitus (HCC) - atorvastatin (LIPITOR) 40 MG tablet; TAKE 1 TABLET (40 MG TOTAL) BY MOUTH DAILY AT 6 PM.  Dispense: 30 tablet; Refill: 2 - Ambulatory referral to Cardiology  9. Chronic combined systolic and diastolic CHF (congestive heart failure) (HCC) - apixaban (ELIQUIS) 5 MG TABS tablet; TAKE 1 TABLET (5 MG TOTAL) BY MOUTH 2 (TWO) TIMES DAILY.  Dispense: 60 tablet; Refill: 3 - furosemide (LASIX) 40 MG tablet; TAKE 1 TABLET (40 MG TOTAL) BY MOUTH DAILY.  Dispense: 90 tablet; Refill: 2 - carvedilol (COREG) 25 MG tablet; TAKE 1 TABLET (25 MG TOTAL) BY MOUTH 2 (TWO) TIMES DAILY.  Dispense: 60 tablet; Refill: 3 - Ambulatory referral to Cardiology-will defer about changing losartan.  Hold entresto(has not taken anyway) and he has stopped BilDil on his own.  cardioly to advise  10. Essential hypertension Uncontrolled-unclear what meds he was and wasn't taking prior to today - losartan (COZAAR) 100 MG tablet; Take 1 tablet (100 mg total) by mouth 1 (one) time each day  Dispense: 90 tablet; Refill: 3 - Ambulatory referral to Cardiology  11. Acute gout, unspecified cause, unspecified site - allopurinol (ZYLOPRIM) 100 MG tablet; TAKE 2 TABLETS (200 MG TOTAL) BY MOUTH DAILY.  Dispense: 60 tablet; Refill: 4 - colchicine 0.6 MG tablet; Take 2 tabs (1.2 mg) at the onset of a gout flare, may repeat 1 tab (0.6 mg) after 2 hours if symptoms persist.  Dispense: 30 tablet; Refill: 1     Patient  have been counseled extensively about nutrition and exercise  Return in about 6 weeks (around 12/24/2020) for PCP/Dr Kotecki-needs follow up on BP meds, blood sugar, and will need labs.  The patient was given clear instructions to go to ER or return to medical center if symptoms don't improve, worsen or new problems develop.  The patient verbalized understanding. The patient was told to call to get lab results if they haven't heard anything in the next week.     Georgian Co, PA-C [facility] [place], [place] [phone]   11/12/2020, 11:51 AM

## 2020-11-13 ENCOUNTER — Other Ambulatory Visit: Payer: Self-pay

## 2020-11-13 LAB — SARS-COV-2, NAA 2 DAY TAT

## 2020-11-13 LAB — NOVEL CORONAVIRUS, NAA: SARS-CoV-2, NAA: DETECTED — AB

## 2020-11-14 ENCOUNTER — Other Ambulatory Visit: Payer: Self-pay

## 2020-11-14 ENCOUNTER — Telehealth: Payer: Self-pay

## 2020-11-14 NOTE — Telephone Encounter (Addendum)
Called to discuss with patient about COVID-19 symptoms and the use of one of the available treatments for those with mild to moderate Covid symptoms and at a high risk of hospitalization.  Pt appears to qualify for outpatient treatment due to co-morbid conditions and/or a member of an at-risk group in accordance with the FDA Emergency Use Authorization.    Symptom onset: No symptoms Vaccinated: Unknown Booster? Unknown Immunocompromised? No Qualifiers: COPD,CHF,HTN NIH Criteria: Tier 4  Unable to reach pt - Unable to leave message.  Pt. Called back and states he has no symptoms. Chris [name]

## 2020-11-14 NOTE — Telephone Encounter (Signed)
Copied from Josephine [id number]. Topic: General - Other >> Nov 14, 2020 10:17 AM Tessa Lerner A wrote: Reason for CRM: Patient has returned missed call from [facility]  Please contact to advise further when possible Attempted to reach pt. Again in regard to positive COVID 19 test and possible further treatment. Voice mail box not set up, unable to leave a message.

## 2020-11-20 ENCOUNTER — Other Ambulatory Visit: Payer: Self-pay

## 2020-11-20 NOTE — Patient Instructions (Signed)
Visit Information  Mr. Manolis was given information about Medicaid Managed Care team care coordination services as a part of their Healthy [facility] Medicaid benefit. CORNIE HERRINGTON verbally consented to engagement with the [facility] Managed Care team.   For questions related to your Healthy [facility] health plan, please call: [phone] or visit the homepage here: [email or web address]  If you would like to schedule transportation through your Healthy [facility] plan, please call the following number at least 2 days in advance of your appointment: [phone]   Call the [facility] at [phone], at any time, 24 hours a day, 7 days a week. If you are in danger or need immediate medical attention call 911.  Mr. Ohalloran - following are the goals we discussed in your visit today:   Goals Addressed   None     Please see education materials related to DM provided as print materials.   Patient verbalizes understanding of instructions provided today.   The Managed Medicaid care management team will reach out to the patient again over the next 90 days.   [place] Constable, Pharm.D., Managed Medicaid Pharmacist [phone]   Following is a copy of your plan of care:  Patient Care Plan: Heart Failure (Adult)     Problem Identified: Symptom Exacerbation (Heart Failure)      Long-Range Goal: Symptom Exacerbation Prevented or Minimized   Start Date: 07/02/2020  Expected End Date: 12/01/2020  Recent Progress: On track  Priority: High  Note:   Current Barriers:  Chronic Disease Management, support and educational needs related to management of multiple chronic diagnosis. Mr. Biello is managing CHF, DMII, stage III CKD and chronic respiratory failure. He missed his last appointment with his PCP and cardiologist. He reports eating right, checking his blood sugar daily and taking all of his medications. He recently moved and is living  with his brother, desires his own place.-Update-Mr. Debski had a recent visit to the ED for pain and weakness. He reports feeling better. He is unable to focus on his health due to his unhappiness with his current living situation. He is living with his brother, but he only wants a place of his own. He is down today, denies feelings or harming himself or others.  Nurse Case Manager Clinical Goal(s):  patient will work with Jackelyn Poling [facility] to address needs related to housing-Mr. Donaho reports no assistance available patient will meet with RN Care Manager to address needs related to managing CHF and DMII-unable to focus on health management patient will demonstrate improved health management independence as evidenced bytaking prescribed medications, reporting any changes in symptoms to PCP such as swelling, changes in breathing, new cough Patient will work with MM pharmacist, Ovid Curd for medication management Patient will work with Jerene Pitch, LCSW for mental health Patient will work with Ubaldo Glassing for housing insecurity  Interventions:  Inter-disciplinary care team collaboration (see longitudinal plan of care) Evaluation of current treatment plan related to CHF and DMII and patient's adherence to plan as established by provider. Advised patient to follow up with PCP appointment for 11/12/20 @ 11:10 Encouraged patient to schedule follow up cardiology appointment Collaborated with [name] regarding housing need Discussed plans with patient for ongoing care management follow up and provided patient with direct contact information for care management team Encouraged patient to have patience while looking for housing, spend time at the park or with friends Provided therapeutic listening  Patient Goals/Self-Care Activities Over the next 30 days, patient will: -Call to fill prescriptions  one week before I run out of medication -Take all medications as prescribed - call office if I  gain more than 2 pounds in one day or 5 pounds in one week - use salt in moderation - watch for swelling in feet, ankles and legs every day  - eat more whole grains, fruits and vegetables, lean meats and healthy fats - know when to call the doctor - track symptoms and what helps feel better or worse - dress right for the weather, hot or cold   Follow Up Plan: Telephone follow up appointment with Managed Medicaid care management team member scheduled for:12/03/20 @ 3:30pm       Patient Care Plan: General Social Work (Adult)     Problem Identified: Homelessness   Onset Date: 07/09/2020  Note:   KENTREL CLEVENGER is a 60 y.o. year old male who sees [name], MD for primary care. The  Medicaid Managed Care team was consulted for assistance with Housing barriers. Mr. Bogan was given information about Care Management services, agreed to services, and verbal consent for services was obtained.  Interventions:  Patient interviewed and appropriate assessments performed Collaborated with clinical team regarding patient needs  SDOH (Social Determinants of Health) assessments performed: Yes     Provided patient with information about the [facility]. [name] contacted [facility] to complete a referral for patient to get into the homeless motel. [name] left a voicemail. [name] informed patient that the process can take a long time. Patient states he does not know why no one wants to help him. [name] informed patient he may be able to go to a shelter in [facility], patient states he does not feel safe in [place].  [name] contacted [employer] in [place] [phone] and left a voicemail for the intake coordinator. [name] contacted [facility] to speak with a Education officer, museum but kept getting transferred.  Patient states he does not have any money to get a room and become frustrated again stating that we "[place]"do not want to help him. [name] informed patient that she will wait for the [facility]  and/or the [facility] to contact her back. [name] informed patient she could provide him with the information for [facility] but patient did not want to call. Update 07/22/20: Patient stated he is doing okay since his surgery but still wants somewhere else to live. Patient stated he knows of a boarding house on [address] and would like SW to help him get in it. SW researched and could not find a boarding house on [place], Patient stated he would get the name of it and contact [name] with the information. Update 10/14/20: [name] contacted patient to complete a follow up. Patient states he is currently living with his brother but is ready to be on his own. He states he sister has found a house for him to stay but it needs a lot of work. Patient is asking for assistance with programs that help disabled with fixing up homes. Patient states the home is in [place] and needs windows and flooring done. [name] will research programs that assists with home repairs/modeling. Patient stated he is going to the beach May 6-18. [name] will follow up with patient on May 23 at 3pm. Update 5/26: [name] received a telephone call from patient. Patient states that he is ready to move. [name] provided patient with information for programs that may be able to help with renovations to the home in [place]. [name] informed patient that since  his uncle owns the home, his uncle will have to call. [employer] [phone] and the [facility] [phone].  Plan:  Over the next 30-60 days, patient will work with [name] to address needs related to Housing barriers Education officer, museum will continue to follow up with patient.   [name], [name], [place]  High Risk Managed Medicaid Team          Problem Identified: Depression Identification (Depression)      Long-Range Goal: Ongoing depression   Start Date: 10/07/2020  Expected End Date: 12/07/2020  Priority: High  Note:    Timeframe:  Long-Range Goal Priority:  High Start Date:    10/07/20                        Expected End Date:  12/07/20                    Follow Up Date 11/25/20   - begin personal counseling - call and visit an old friend - check out volunteer opportunities - join a support group - laugh; watch a funny movie or comedian - learn and use visualization or guided imagery - perform a random act of kindness - practice relaxation or meditation daily - start or continue a personal journal - talk about feelings with a friend, family or spiritual advisor - practice positive thinking and self-talk    Why is this important?   When you are stressed, down or upset, your body reacts too.  For example, your blood pressure may get higher; you may have a headache or stomachache.  When your emotions get the best of you, your body's ability to fight off cold and flu gets weak.  These steps will help you manage your emotions.   Current Barriers:  Chronic Mental Health needs related to depression and need for housing Limited social support, Housing barriers, and Mental Health Concerns  Social Isolation ADL IADL limitations Suicidal Ideation/Homicidal Ideation: No  Clinical Social Work Goal(s):  Over the next 120 days, patient will work with SW monthly by telephone or in person to reduce or manage symptoms related to depression patient will work with [name] to address needs related to finding stable housing but patient was made aware that housing resources are very limited at this time  Interventions: Patient interviewed and appropriate assessments performed: brief mental health assessment PHQ 2 PHQ 9 SDOH Interventions    Flowsheet Row Most Recent Value  SDOH Interventions   SDOH Interventions for the Following Domains Housing  Housing Interventions Other (Comment)  [Referral for housing support]  Depression Interventions/Treatment  Patient refuses Treatment      Patient interviewed and  appropriate assessments performed Discussed plans with patient for ongoing care management follow up and provided patient with direct contact information for care management team Assisted patient/caregiver with obtaining information about health plan benefits Encouraged patient to consider a mental health provider for long term follow up and therapy/counseling but patient declined. Patient reports that does not need a psychiatry referral at this time. He reports that his main source of depression is from not being able to find a place on his own. He is currently stable and residing with his brother and his brother's family in their home. Patient reports that his brother leaves for work at 3 pm during the week and gets back home around 1 am. [employer] number provided to patient. Patient reports that his  sister has found him a house to relocate in. This house is inherited from the family and will need some additional work done. Patient reports that this house has mold in it but he rather reside there instead of living with his brother. Patient is currently stable and reports no urgent concerns at this time. [facility] LCSW completed care coordination with [facility] [name] on 11/06/20 who will follow up with patient regarding housing.  Patient reports recent agitation due to his inability to secure housing on this own. [facility] LCSW provided emotional support and brief housing resource education. Patient is currently not on any public housing wait list.   Solution-Focused Strategies, Mindfulness or Relaxation Training, Active listening / Reflection utilized , Emotional Supportive Provided, and Verbalization of feelings encouraged  Patient declines any current substance use. He reports that his last use of cocaine was over 6 months ago. Patient reports that he has developed social anxiety. He admits that he gets irritable at times and will snap at his loved ones and then will experience guilt afterwards. Educated  patient on coping methods to implement into his daily life to combat anxiety symptoms and stress. Patient denied any current suicidal or homicidal ideations. Encouraged patient to implement deep breathing and grounding exercises into his daily routine due to ongoing anxiety and SOB.  Patient is now agreeable to mental health support and referrals. [facility] LCSW placed referral for counseling and medication management through the [facility]. Patient was advised to expect a call from their program to initiate services.   Patient Self Care Activities:  Ability for insight Independent living Strong family or social support  Patient Coping Strengths:  Self Advocate Able to Communicate Effectively  Patient Self Care Deficits:  Lacks social connections  Depression screen [facility] 2/9 10/07/2020 11/01/2019 09/11/2019 07/09/2019 04/04/2019  Decreased Interest 2 2 0 1 3  Down, Depressed, Hopeless 2 3 1 1 3   PHQ - 2 Score 4 5 1 2 6   Altered sleeping 1 3 3  0 3  Tired, decreased energy 3 2 0 1 3  Change in appetite 0 - 0 1 0  Feeling bad or failure about yourself  2 2 0 0 3  Trouble concentrating 2 2 0 0 3  Moving slowly or fidgety/restless 0 - 0 0 3  Suicidal thoughts 0 0 0 0 0  PHQ-9 Score 12 14 4 4 21   Difficult doing work/chores Somewhat difficult - - - Extremely dIfficult     Task: Identify Depressive Symptoms and Facilitate Treatment   Note:   Care Management Activities:    - depression screen reviewed - participation in psychiatric services encouraged    Notes:     Patient Care Plan: Medication Management     Problem Identified: Health Promotion or Disease Self-Management (General Plan of Care)      Goal: Medication Management   Note:   Current Barriers:  Does not adhere to prescribed medication regimen Does not maintain contact with provider office Does not contact provider office for questions/concerns   Pharmacist Clinical Goal(s):  Over the next 30 days, patient will  contact provider office for questions/concerns as evidenced notation of same in electronic health record through collaboration with PharmD and provider.    Interventions: Inter-disciplinary care team collaboration (see longitudinal plan of care) Comprehensive medication review performed; medication list updated in electronic medical record  @RXCPDIABETES @ @RXCPHYPERTENSION @ @RXCPHYPERLIPIDEMIA @  Patient Goals/Self-Care Activities Over the next 30 days, patient will:  - collaborate with provider on medication access solutions  Follow  Up Plan: The care management team will reach out to the patient again over the next 30 days.      Task: Mutually Develop and Royce Macadamia Achievement of Patient Goals   Note:   Care Management Activities:    - verbalization of feelings encouraged    Notes:

## 2020-11-20 NOTE — Patient Outreach (Addendum)
Medicaid Managed Care    Pharmacy Note  11/20/2020 Name: Chris [name] MRN: [id number] DOB: [birth year]  Chris [name] is a 60 y.o. year old male who is a primary care patient of [name], MD. The [facility] Managed Care Coordination team was consulted for assistance with disease management and care coordination needs.    Engaged with patient Engaged with patient by telephone for follow up visit in response to referral for case management and/or care coordination services.  Mr. Egger was given information about Managed Medicaid Care Coordination team services today. Chris [name] agreed to services and verbal consent obtained.   Objective:  Lab Results  Component Value Date   CREATININE 1.61 (H) 10/29/2020   CREATININE 1.40 (H) 10/28/2020   CREATININE 1.46 (H) 10/28/2020    Lab Results  Component Value Date   HGBA1C 6.6 (H) 10/29/2020       Component Value Date/Time   CHOL 176 12/25/2019 0632   CHOL 160 11/07/2019 0918   CHOL 157 10/21/2013 0435   TRIG 91 12/25/2019 0632   TRIG 117 10/21/2013 0435   HDL 37 (L) 12/25/2019 0632   HDL 40 11/07/2019 0918   HDL 30 (L) 10/21/2013 0435   CHOLHDL 4.8 12/25/2019 0632   VLDL 18 12/25/2019 0632   VLDL 23 10/21/2013 0435   LDLCALC 121 (H) 12/25/2019 0632   LDLCALC 92 11/07/2019 0918   LDLCALC 104 (H) 10/21/2013 0435    Other: (TSH, CBC, Vit D, etc.)  Clinical ASCVD: Yes  The 10-year ASCVD risk score [name], et al., 2013) is: 60.7%   Values used to calculate the score:     Age: 23 years     Sex: Male     Is Non-Hispanic African American: Yes     Diabetic: Yes     Tobacco smoker: Yes     Systolic Blood Pressure: 854 mmHg     Is BP treated: Yes     HDL Cholesterol: 37 mg/dL     Total Cholesterol: 176 mg/dL    Other: (CHADS2VASc if Afib, PHQ9 if depression, MMRC or CAT for COPD, ACT, DEXA)  BP Readings from Last 3 Encounters:  11/12/20 (!) 185/101  10/29/20 (!) 165/98  07/17/20 126/90     Assessment/Interventions: Review of patient past medical history, allergies, medications, health status, including review of consultants reports, laboratory and other test data, was performed as part of comprehensive evaluation and provision of chronic care management services.   Cardio Apixaban 5mg  ASA 81mg  Carvedilol 25mg  BID Losartan 100mg  Furosemide Plan: At goal,  patient stable/ symptoms controlled   Lipids Atorvastatin 40mg  Plan: At goal,  patient stable/ symptoms controlled   Gout Allopurinol Cochicine Plan: At goal,  patient stable/ symptoms controlled   DM Lab Results  Component Value Date   HGBA1C 6.6 (H) 10/29/2020   HGBA1C 6.4 (H) 07/05/2020   HGBA1C 6.2 (H) 05/04/2020   Lab Results  Component Value Date   LDLCALC 121 (H) 12/25/2019   CREATININE 1.61 (H) 10/29/2020    Lab Results  Component Value Date   NA 136 10/29/2020   K 3.9 10/29/2020   CREATININE 1.61 (H) 10/29/2020   GFRNONAA 49 (L) 10/29/2020   GFRAA 42 (L) 01/14/2020   GLUCOSE 123 (H) 10/29/2020   Dapagliflozin Glipizide 5mg  take 0.5 tab BID Plan: At goal,  patient stable/ symptoms controlled  Meds -Patient really wants to get own housing, this is his top priority. 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 He said he doesn't want to work with people who won't work with him. I encouraged him to call the SW because housing is top priority. Will focus on meds next visit 11/20/20: Finally got new house. Spoke with patient about his non-compliance and he stated it's hard for him to drive to pick up his meds and would like them delivered if he could. Plan: Verbal consent obtained for [facility] enhanced pharmacy services (medication  synchronization, adherence packaging, delivery coordination). A medication sync plan was created to allow patient to get all medications delivered once every 30 to 90 days per patient preference. Patient understands they have freedom to choose pharmacy and clinical pharmacist will coordinate care between all prescribers and [facility] [place] to ask about meds list (Patient non-compliant and didn't want to start him on all cardio meds at once and have orthostasis. Darylene Price said, "I haven't seen him so I can't write scripts." I asked her to call and schedule appt with patient ASAP and she agreed -Reached out to Dr. Thereasa Solo to ask about same issue and get meds transferred. Per her note, this is his current meds list   Medication Name PCP Transfer (put Dr.'s name) -Timing      BB  B  L  EM  BT  Albuterol inhaler         Allopurinol 100mg  Karle Plumber   2     Eliquis 5mg    Oswaldo Milian  1  1   ASA 81    1     Atorvastatin 40mg       1   Symbicort 160-4.5         Carvedilol 25mg  BID  Oswaldo Milian  1  1   Colchicine 0.6mg          Dapagliflozin 10mg     1     Furosemide 40mg     1     Glipizide 5mg     1/2              Losartan 100mg       1        SDOH (Social Determinants of Health) assessments and interventions performed:    Care Plan  No Known Allergies  Medications Reviewed Today     Reviewed by [name] (Physician Assistant) on 11/12/20 at 1213  Med List Status: <None>   Medication Order Taking? Sig Documenting Provider Last Dose Status Informant  Accu-Chek Softclix Lancets lancets [id number]  USE AS INSTRUCTED. INJECT INTO THE SKIN ONCE DAILY  Patient taking differently: USE AS INSTRUCTED. INJECT INTO THE SKIN ONCE DAILY   [name], NP  Active Other  albuterol (PROVENTIL) (2.5 MG/3ML) 0.083% nebulizer solution [id number]  TAKE 3 MLS BY NEBULIZATION EVERY 6 (SIX) HOURS AS NEEDED FOR SHORTNESS OF BREATH. Freeman Caldron M, PA-C  Active   albuterol (VENTOLIN HFA) 108 (90 Base) MCG/ACT inhaler [id number]  INHALE 2 PUFFS INTO THE LUNGS EVERY 6 (SIX) HOURS AS NEEDED FOR WHEEZING OR SHORTNESS OF BREATH. Freeman Caldron M, [place]  Active   allopurinol (ZYLOPRIM) 100 MG tablet [id number]  TAKE 2 TABLETS (200 MG TOTAL) BY MOUTH DAILY. Freeman Caldron M, PA-C  Active   apixaban [facility]) 5  MG TABS tablet [id number]  TAKE 1 TABLET (5 MG TOTAL) BY MOUTH 2 (TWO) TIMES DAILY. [name]  Active   aspirin 81 MG EC tablet [id number]  TAKE 1 TABLET (81 MG TOTAL) BY MOUTH DAILY. [name], [place]  Active   atorvastatin (LIPITOR) 40 MG tablet [id number]  TAKE 1 TABLET (40 MG TOTAL) BY MOUTH DAILY AT 6 PM. [name], PA-C  Active   budesonide-formoterol [facility]) 160-4.5 MCG/ACT inhaler [id number]  INHALE 2 PUFFS INTO THE LUNGS 2 (TWO) TIMES DAILY. Freeman Caldron M, [place]  Active   carvedilol (COREG) 25 MG tablet [id number]  TAKE 1 TABLET (25 MG TOTAL) BY MOUTH 2 (TWO) TIMES DAILY. Freeman Caldron M, [place]  Active   colchicine 0.6 MG tablet [id number]  Take 2 tabs (1.2 mg) at the onset of a gout flare, may repeat 1 tab (0.6 mg) after 2 hours if symptoms persist. Freeman Caldron M, PA-C  Active   dapagliflozin propanediol (FARXIGA) 10 MG TABS tablet [id number]  Take 10 mg by mouth 1 (one) time each day Freeman Caldron M, PA-C  Active   diclofenac Sodium (VOLTAREN) 1 % GEL [id number] No APPLY 2 G TOPICALLY 4 (FOUR) TIMES DAILY. USE ON ELBOW PAIN  Patient not taking: No sig reported   [name], MD Not Taking Active Other  diltiazem (CARDIZEM CD) 180 MG 24 hr capsule [id number]  Take 1 capsule (180 mg total) by mouth daily. Freeman Caldron M, PA-C  Active   fluticasone [facility]) 50 MCG/ACT nasal spray [id number]  Place 1 spray into both nostrils daily as needed for allergies or rhinitis. Freeman Caldron M, [place]  Active   furosemide (LASIX) 40 MG tablet [id number]  TAKE 1 TABLET (40 MG TOTAL) BY MOUTH DAILY.  Freeman Caldron M, PA-C  Active   glipiZIDE (GLUCOTROL) 5 MG tablet [id number]  Take 0.5 tablets (2.5 mg total) by mouth 2 (two) times daily before a meal. McClung, Angela M, PA-C  Active   isosorbide-hydrALAZINE (BIDIL) 20-37.5 MG tablet [id number] No TAKE 1 TABLET BY MOUTH 3 (THREE) TIMES DAILY.  Patient not taking: No sig reported   [name], MD Not Taking Active Other  losartan (COZAAR) 100 MG tablet [id number]  Take 1 tablet (100 mg total) by mouth 1 (one) time each day [name]  Active     Discontinued 07/11/20 7867 (Discontinued by provider)   montelukast (SINGULAIR) 10 MG tablet [id number]  TAKE 1 TABLET (10 MG TOTAL) BY MOUTH AT BEDTIME. [name], [place]  Active   Patient not taking:  Discontinued 11/12/20 1100 sacubitril-valsartan (ENTRESTO) 24-26 MG [id number] No Take 1 tablet by mouth 2 (two) times daily.  Patient not taking: Reported on 11/12/2020   [provider] Not Taking Active Other  sodium polystyrene (KAYEXALATE) 15 GM/60ML suspension [id number] No TAKE 60 MLS (15 G TOTAL) BY MOUTH ONCE FOR 1 DOSE.  Patient not taking: No sig reported   [name], MD Not Taking Active Other  sodium zirconium cyclosilicate (LOKELMA) 5 g packet [id number] No TAKE 5 G BY MOUTH EVERY OTHER DAY FOR 2 DOSES.  Patient not taking: No sig reported   [name], MD Not Taking Active Other  triamcinolone cream (KENALOG) 0.1 % [id number]  Apply 1 application topically 2 (two) times daily as needed (skin irritation on face). [name], [place]  Active             Patient Active Problem List   Diagnosis Date  Noted   Left-sided weakness 10/28/2020   COPD exacerbation (HCC)    Typical atrial flutter (HCC)    Acute respiratory failure with hypoxia ([place]) 07/05/2020   CHF (congestive heart failure) ([place]) 07/04/2020   Acute exacerbation of CHF (congestive heart failure) ([place]) 06/16/2020   COPD with acute exacerbation ([place]) 06/16/2020   Influenza  vaccine refused 05/06/2020   Acute on chronic combined systolic (congestive) and diastolic (congestive) heart failure ([place]) 05/05/2020   Acute decompensated heart failure ([place]) 05/04/2020   Illiteracy 05/04/2020   Chronic respiratory failure with hypoxia, on home oxygen therapy ([place]) 12/28/2019   Type 2 diabetes mellitus with stage 3 chronic kidney disease ([place]) 12/25/2019   Acute and chronic respiratory failure (acute-on-chronic) ([place]) 12/25/2019   Acute on chronic combined systolic and diastolic CHF (congestive heart failure) ([place]) 10/26/2019   Elevated troponin I level 10/26/2019   Acute on chronic diastolic (congestive) heart failure ([place]) 10/26/2019   History of gout 02/01/2019   Seasonal allergic rhinitis due to pollen 02/01/2019   Tobacco dependence 11/30/2018   Microscopic hematuria 11/30/2018   Depression 11/30/2018   Difficulty controlling anger 11/30/2018   CAP (community acquired pneumonia) 08/11/2018   COPD (chronic obstructive pulmonary disease) ([place])    CKD (chronic kidney disease) stage 3, GFR 30-59 ml/min (HCC) 08/10/2018   Recurrent epistaxis 04/21/2018   Mixed hyperlipidemia 07/28/2017   Essential hypertension 16/03/9603   Chronic systolic heart failure ([place]) 10/25/2014   Cocaine abuse ([place]) 02/20/2013   Cannabis abuse 02/20/2013   Back pain, chronic 02/20/2013    Conditions to be addressed/monitored: HTN, Hypertriglyceridemia and DM  Care Plan : Medication Management  Updates made by Lane Hacker, [place] since 10/03/2020 12:00 AM     Problem: Health Promotion or Disease Self-Management (General Plan of Care)      Goal: Medication Management   Note:   Current Barriers:  Does not adhere to prescribed medication regimen Does not maintain contact with provider office Does not contact provider office for questions/concerns   Pharmacist Clinical Goal(s):  Over the next 30 days, patient will contact provider office for questions/concerns as evidenced  notation of same in electronic health record through collaboration with PharmD and provider.    Interventions: Inter-disciplinary care team collaboration (see longitudinal plan of care) Comprehensive medication review performed; medication list updated in electronic medical record  @RXCPDIABETES @ @RXCPHYPERTENSION @ @RXCPHYPERLIPIDEMIA @  Patient Goals/Self-Care Activities Over the next 30 days, patient will:  - collaborate with provider on medication access solutions  Follow Up Plan: The care management team will reach out to the patient again over the next 30 days.      Task: Mutually Develop and Royce Macadamia Achievement of Patient Goals   Note:   Care Management Activities:    - verbalization of feelings encouraged    Notes:      Medication Assistance: None required. Patient affirms current coverage meets needs.   Follow up: Agree   Plan: The care management team will reach out to the patient again over the next 30 days.   [place] Constable, Pharm.D., Managed Medicaid Pharmacist - [phone]

## 2020-11-21 ENCOUNTER — Other Ambulatory Visit: Payer: Self-pay

## 2020-11-21 NOTE — Patient Instructions (Signed)
Visit Information  Mr. ZACHARIE PORTNER  - as a part of your Medicaid benefit, you are eligible for care management and care coordination services at no cost or copay. I was unable to reach you by phone today but would be happy to help you with your health related needs. Please feel free to call me @ [phone].   A member of the Managed Medicaid care management team will reach out to you again over the next 30 days.   [name], [name], [place] Managed Medicaid Team  [phone]

## 2020-11-21 NOTE — Patient Outreach (Signed)
Care Coordination  11/21/2020  Chris [name] Sep 20, 1960 [id number]   Medicaid Managed Care   Unsuccessful Outreach Note  11/21/2020 Name: Chris [name] MRN: [id number] DOB: 1960/07/04  Referred by: Chris Pier, MD Reason for referral : High Risk Managed Medicaid (MM Social work Unsuccessful [employer])   An unsuccessful telephone outreach was attempted today. The patient was referred to the case management team for assistance with care management and care coordination.   Follow Up Plan: The care management team will reach out to the patient again over the next 30 days.   [name], [name], [place] Managed Medicaid Team  [phone]

## 2020-11-25 ENCOUNTER — Telehealth: Payer: Self-pay

## 2020-11-25 ENCOUNTER — Other Ambulatory Visit: Payer: Self-pay | Admitting: Licensed Clinical Social Worker

## 2020-11-25 NOTE — Patient Outreach (Signed)
Medicaid Managed Care Social Work Note  11/25/2020 Name:  Chris [name] MRN:  [id number] DOB:  1960-12-06  Chris [name] is an 60 y.o. year old male who is a primary patient of [name], MD.  The Medicaid Managed Care Coordination team was consulted for assistance with:  Community Resources   [name] was given information about Medicaid Managed CareCoordination services today. Chris [name] agreed to services and verbal consent obtained.  Engaged with patient  for by telephone forfollow up visit in response to referral for case management and/or care coordination services.   Assessments/Interventions:  Review of past medical history, allergies, medications, health status, including review of consultants reports, laboratory and other test data, was performed as part of comprehensive evaluation and provision of chronic care management services.  SDOH: (Social Determinant of Health) assessments and interventions performed:  [name] received a telephone call back from patient. He states he has moved but needs assistance with getting the floor fixed, a new stove , refrigerator and furniture. [name] informed patient she would see if she could locate some resources that will help with the furniture. [name] already provided resources for getting the floor fixed, patient states he has not contacted those resources yet.   Advanced Directives Status:  Not addressed in this encounter.  Care Plan                 No Known Allergies  Medications Reviewed Today     Reviewed by Chris Donovan, PA-C (Physician Assistant) on 11/12/20 at 1213  Med List Status: <None>   Medication Order Taking? Sig Documenting Provider Last Dose Status Informant  Accu-Chek Softclix Lancets lancets [id number]  USE AS INSTRUCTED. INJECT INTO THE SKIN ONCE DAILY  Patient taking differently: USE AS INSTRUCTED. INJECT INTO THE SKIN ONCE DAILY   Chris Pounds, NP  Active Other  albuterol (PROVENTIL) (2.5 MG/3ML)  0.083% nebulizer solution [id number]  TAKE 3 MLS BY NEBULIZATION EVERY 6 (SIX) HOURS AS NEEDED FOR SHORTNESS OF BREATH. Chris Caldron M, PA-C  Active   albuterol (VENTOLIN HFA) 108 (90 Base) MCG/ACT inhaler [id number]  INHALE 2 PUFFS INTO THE LUNGS EVERY 6 (SIX) HOURS AS NEEDED FOR WHEEZING OR SHORTNESS OF BREATH. Chris [name], [place]  Active   allopurinol (ZYLOPRIM) 100 MG tablet [id number]  TAKE 2 TABLETS (200 MG TOTAL) BY MOUTH DAILY. Chris Caldron M, PA-C  Active   apixaban (ELIQUIS) 5 MG TABS tablet [id number]  TAKE 1 TABLET (5 MG TOTAL) BY MOUTH 2 (TWO) TIMES DAILY. Chris [name]  Active   aspirin 81 MG EC tablet [id number]  TAKE 1 TABLET (81 MG TOTAL) BY MOUTH DAILY. Chris [name], [place]  Active   atorvastatin (LIPITOR) 40 MG tablet [id number]  TAKE 1 TABLET (40 MG TOTAL) BY MOUTH DAILY AT 6 PM. Chris Donovan, PA-C  Active   budesonide-formoterol [facility]) 160-4.5 MCG/ACT inhaler [id number]  INHALE 2 PUFFS INTO THE LUNGS 2 (TWO) TIMES DAILY. Chris [name], [place]  Active   carvedilol (COREG) 25 MG tablet [id number]  TAKE 1 TABLET (25 MG TOTAL) BY MOUTH 2 (TWO) TIMES DAILY. Chris [name], [place]  Active   colchicine 0.6 MG tablet [id number]  Take 2 tabs (1.2 mg) at the onset of a gout flare, may repeat 1 tab (0.6 mg) after 2 hours if symptoms persist. Chris Caldron M, PA-C  Active   dapagliflozin propanediol (FARXIGA) 10 MG TABS tablet [id number]  Take 10 mg by mouth 1 (one) time  each day Chris Donovan, PA-C  Active   diclofenac Sodium (VOLTAREN) 1 % GEL [id number] No APPLY 2 G TOPICALLY 4 (FOUR) TIMES DAILY. USE ON ELBOW PAIN  Patient not taking: No sig reported   Chris Anis, MD Not Taking Active Other  diltiazem (CARDIZEM CD) 180 MG 24 hr capsule [id number]  Take 1 capsule (180 mg total) by mouth daily. Chris Caldron M, PA-C  Active   fluticasone [facility]) 50 MCG/ACT nasal spray [id number]  Place 1 spray into both nostrils daily as needed for allergies or rhinitis.  Chris [name], [place]  Active   furosemide (LASIX) 40 MG tablet [id number]  TAKE 1 TABLET (40 MG TOTAL) BY MOUTH DAILY. Chris Caldron M, PA-C  Active   glipiZIDE (GLUCOTROL) 5 MG tablet [id number]  Take 0.5 tablets (2.5 mg total) by mouth 2 (two) times daily before a meal. McClung, Angela M, PA-C  Active   isosorbide-hydrALAZINE (BIDIL) 20-37.5 MG tablet [id number] No TAKE 1 TABLET BY MOUTH 3 (THREE) TIMES DAILY.  Patient not taking: No sig reported   [name], MD Not Taking Active Other  losartan (COZAAR) 100 MG tablet [id number]  Take 1 tablet (100 mg total) by mouth 1 (one) time each day Chris [name]  Active     Discontinued 07/11/20 7893 (Discontinued by provider)   montelukast (SINGULAIR) 10 MG tablet [id number]  TAKE 1 TABLET (10 MG TOTAL) BY MOUTH AT BEDTIME. Chris [name], [place]  Active   Patient not taking:  Discontinued 11/12/20 1100 sacubitril-valsartan (ENTRESTO) 24-26 MG [id number] No Take 1 tablet by mouth 2 (two) times daily.  Patient not taking: Reported on 11/12/2020   [provider] Not Taking Active Other  sodium polystyrene (KAYEXALATE) 15 GM/60ML suspension [id number] No TAKE 60 MLS (15 G TOTAL) BY MOUTH ONCE FOR 1 DOSE.  Patient not taking: No sig reported   [name], MD Not Taking Active Other  sodium zirconium cyclosilicate (LOKELMA) 5 g packet [id number] No TAKE 5 G BY MOUTH EVERY OTHER DAY FOR 2 DOSES.  Patient not taking: No sig reported   Chris Hones, MD Not Taking Active Other  triamcinolone cream (KENALOG) 0.1 % [id number]  Apply 1 application topically 2 (two) times daily as needed (skin irritation on face). Chris [name], [place]  Active             Patient Active Problem List   Diagnosis Date Noted   Left-sided weakness 10/28/2020   COPD exacerbation (HCC)    Typical atrial flutter (HCC)    Acute respiratory failure with hypoxia ([place]) 07/05/2020   CHF (congestive heart failure) ([place]) 07/04/2020   Acute  exacerbation of CHF (congestive heart failure) ([place]) 06/16/2020   COPD with acute exacerbation ([place]) 06/16/2020   Influenza vaccine refused 05/06/2020   Acute on chronic combined systolic (congestive) and diastolic (congestive) heart failure ([place]) 05/05/2020   Acute decompensated heart failure ([place]) 05/04/2020   Illiteracy 05/04/2020   Chronic respiratory failure with hypoxia, on home oxygen therapy ([place]) 12/28/2019   Type 2 diabetes mellitus with stage 3 chronic kidney disease ([place]) 12/25/2019   Acute and chronic respiratory failure (acute-on-chronic) ([place]) 12/25/2019   Acute on chronic combined systolic and diastolic CHF (congestive heart failure) ([place]) 10/26/2019   Elevated troponin I level 10/26/2019   Acute on chronic diastolic (congestive) heart failure ([place]) 10/26/2019   History of gout 02/01/2019   Seasonal allergic rhinitis due to pollen 02/01/2019   Tobacco dependence 11/30/2018   Microscopic hematuria  11/30/2018   Depression 11/30/2018   Difficulty controlling anger 11/30/2018   CAP (community acquired pneumonia) 08/11/2018   COPD (chronic obstructive pulmonary disease) (HCC)    CKD (chronic kidney disease) stage 3, GFR 30-59 ml/min (HCC) 08/10/2018   Recurrent epistaxis 04/21/2018   Mixed hyperlipidemia 07/28/2017   Essential hypertension 81/06/7508   Chronic systolic heart failure ([place]) 10/25/2014   Cocaine abuse ([place]) 02/20/2013   Cannabis abuse 02/20/2013   Back pain, chronic 02/20/2013    Conditions to be addressed/monitored per PCP order:   assistance with getting home repairs  Care Plan : General Social Work (Adult)  Updates made by Ethelda Chick since 11/25/2020 12:00 AM     Problem: Homelessness   Onset Date: 07/09/2020  Note:   NAI DASCH is a 60 y.o. year old male who sees [name], MD for primary care. The  Medicaid Managed Care team was consulted for assistance with Housing barriers. Mr. Appelt was given information about Care  Management services, agreed to services, and verbal consent for services was obtained.  Interventions:  Patient interviewed and appropriate assessments performed Collaborated with clinical team regarding patient needs  SDOH (Social Determinants of Health) assessments performed: Yes     Provided patient with information about the [facility]. [name] contacted [facility] to complete a referral for patient to get into the homeless motel. [name] left a voicemail. [name] informed patient that the process can take a long time. Patient states he does not know why no one wants to help him. [name] informed patient he may be able to go to a shelter in [facility], patient states he does not feel safe in [place].  [name] contacted [employer] in [place] [phone] and left a voicemail for the intake coordinator. [name] contacted [place] to speak with a Education officer, museum but kept getting transferred.  Patient states he does not have any money to get a room and become frustrated again stating that we "[place]"do not want to help him. [name] informed patient that she will wait for the [facility] and/or the [facility] to contact her back. [name] informed patient she could provide him with the information for [facility] but patient did not want to call. Update 07/22/20: Patient stated he is doing okay since his surgery but still wants somewhere else to live. Patient stated he knows of a boarding house on [address] and would like SW to help him get in it. SW researched and could not find a boarding house on [address], Patient stated he would get the name of it and contact [name] with the information. Update 10/14/20: [name] contacted patient to complete a follow up. Patient states he is currently living with his brother but is ready to be on his own. He states he sister has found a house for him to stay but it needs a lot of work. Patient is asking for assistance with programs that help disabled with fixing up  homes. Patient states the home is in [place] and needs windows and flooring done. [name] will research programs that assists with home repairs/modeling. Patient stated he is going to the beach May 6-18. [name] will follow up with patient on May 23 at 3pm. Update 5/26: [name] received a telephone call from patient. Patient states that he is ready to move. [name] provided patient with information for programs that may be able to help with renovations to the home in [place]. [name] informed patient that since his uncle owns the home, his  uncle will have to call. [employer] [phone] and the [facility] [phone]. Update: 11/25/20: [name] received a telephone call back from patient. He states he has moved but needs assistance with getting the floor fixed, a new stove , refrigerator and furniture. [name] informed patient she would see if she could locate some resources that will help with the furniture. [name] already provided resources for getting the floor fixed, patient states he has not contacted those resources yet.  Plan:  Over the next 30-60 days, patient will work with [name] to address needs related to Housing barriers Education officer, museum will continue to follow up with patient.   [name], [name], [place]  High Risk Managed Medicaid Team           Follow up:  Patient agrees to Care Plan and Follow-up.  Plan: The Managed Medicaid care management team will reach out to the patient again over the next 30 days.    [name], [name], [place] Managed Medicaid Team  [phone]

## 2020-11-25 NOTE — Patient Outreach (Signed)
Medicaid Managed Care Social Work Note  11/25/2020 Name:  Chris [name] MRN:  [id number] DOB:  07-02-1960  Chris [name] is an 60 y.o. year old male who is a primary patient of Chris Matar, MD.  The Medicaid Managed Care Coordination team was consulted for assistance with:  Mental Health Counseling and Resources  [name] was given information about Medicaid Managed CareCoordination services today. Chris [name] agreed to services and verbal consent obtained.  Engaged with patient  for by telephone forfollow up visit in response to referral for case management and/or care coordination services.   Assessments/Interventions:  Review of past medical history, allergies, medications, health status, including review of consultants reports, laboratory and other test data, was performed as part of comprehensive evaluation and provision of chronic care management services.  SDOH: (Social Determinant of Health) assessments and interventions performed: SDOH Interventions    Flowsheet Row Most Recent Value  SDOH Interventions   Stress Interventions Offered [employer] Resources, Provide Counseling  Depression Interventions/Treatment  Referral to Psychiatry       Advanced Directives Status:  See Care Plan for related entries.  Care Plan                 No Known Allergies  Medications Reviewed Today     Reviewed by Chris Simmonds, PA-C (Physician Assistant) on 11/12/20 at 1213  Med List Status: <None>   Medication Order Taking? Sig Documenting Provider Last Dose Status Informant  Accu-Chek Softclix Lancets lancets [id number]  USE AS INSTRUCTED. INJECT INTO THE SKIN ONCE DAILY  Patient taking differently: USE AS INSTRUCTED. INJECT INTO THE SKIN ONCE DAILY   Claiborne Rigg, NP  Active Other  albuterol (PROVENTIL) (2.5 MG/3ML) 0.083% nebulizer solution [id number]  TAKE 3 MLS BY NEBULIZATION EVERY 6 (SIX) HOURS AS NEEDED FOR SHORTNESS OF BREATH. Georgian Co M, PA-C   Active   albuterol (VENTOLIN HFA) 108 (90 Base) MCG/ACT inhaler [id number]  INHALE 2 PUFFS INTO THE LUNGS EVERY 6 (SIX) HOURS AS NEEDED FOR WHEEZING OR SHORTNESS OF BREATH. Georgian Co M, [place]  Active   allopurinol (ZYLOPRIM) 100 MG tablet [id number]  TAKE 2 TABLETS (200 MG TOTAL) BY MOUTH DAILY. Georgian Co M, PA-C  Active   apixaban (ELIQUIS) 5 MG TABS tablet [id number]  TAKE 1 TABLET (5 MG TOTAL) BY MOUTH 2 (TWO) TIMES DAILY. Chris [name]  Active   aspirin 81 MG EC tablet [id number]  TAKE 1 TABLET (81 MG TOTAL) BY MOUTH DAILY. Chris [name], [place]  Active   atorvastatin (LIPITOR) 40 MG tablet [id number]  TAKE 1 TABLET (40 MG TOTAL) BY MOUTH DAILY AT 6 PM. Chris Simmonds, PA-C  Active   budesonide-formoterol [facility]) 160-4.5 MCG/ACT inhaler [id number]  INHALE 2 PUFFS INTO THE LUNGS 2 (TWO) TIMES DAILY. Georgian Co M, [place]  Active   carvedilol (COREG) 25 MG tablet [id number]  TAKE 1 TABLET (25 MG TOTAL) BY MOUTH 2 (TWO) TIMES DAILY. Georgian Co M, [place]  Active   colchicine 0.6 MG tablet [id number]  Take 2 tabs (1.2 mg) at the onset of a gout flare, may repeat 1 tab (0.6 mg) after 2 hours if symptoms persist. Georgian Co M, PA-C  Active   dapagliflozin propanediol (FARXIGA) 10 MG TABS tablet [id number]  Take 10 mg by mouth 1 (one) time each day Georgian Co M, PA-C  Active   diclofenac Sodium (VOLTAREN) 1 % GEL [id number] No APPLY 2 G TOPICALLY 4 (FOUR) TIMES DAILY. USE ON  ELBOW PAIN  Patient not taking: No sig reported   Chris Barrio, MD Not Taking Active Other  diltiazem (CARDIZEM CD) 180 MG 24 hr capsule [id number]  Take 1 capsule (180 mg total) by mouth daily. Georgian Co M, PA-C  Active   fluticasone [facility]) 50 MCG/ACT nasal spray [id number]  Place 1 spray into both nostrils daily as needed for allergies or rhinitis. Georgian Co M, [place]  Active   furosemide (LASIX) 40 MG tablet [id number]  TAKE 1 TABLET (40 MG TOTAL) BY MOUTH DAILY. Georgian Co M, PA-C   Active   glipiZIDE (GLUCOTROL) 5 MG tablet [id number]  Take 0.5 tablets (2.5 mg total) by mouth 2 (two) times daily before a meal. McClung, Chris M, PA-C  Active   isosorbide-hydrALAZINE (BIDIL) 20-37.5 MG tablet [id number] No TAKE 1 TABLET BY MOUTH 3 (THREE) TIMES DAILY.  Patient not taking: No sig reported   Chris Matar, MD Not Taking Active Other  losartan (COZAAR) 100 MG tablet [id number]  Take 1 tablet (100 mg total) by mouth 1 (one) time each day Chris [name]  Active     Discontinued 07/11/20 7425 (Discontinued by provider)   montelukast (SINGULAIR) 10 MG tablet [id number]  TAKE 1 TABLET (10 MG TOTAL) BY MOUTH AT BEDTIME. Chris [name], [place]  Active   Patient not taking:  Discontinued 11/12/20 1100 sacubitril-valsartan (ENTRESTO) 24-26 MG [id number] No Take 1 tablet by mouth 2 (two) times daily.  Patient not taking: Reported on 11/12/2020   [provider] Not Taking Active Other  sodium polystyrene (KAYEXALATE) 15 GM/60ML suspension [id number] No TAKE 60 MLS (15 G TOTAL) BY MOUTH ONCE FOR 1 DOSE.  Patient not taking: No sig reported   Chris Matar, MD Not Taking Active Other  sodium zirconium cyclosilicate (LOKELMA) 5 g packet [id number] No TAKE 5 G BY MOUTH EVERY OTHER DAY FOR 2 DOSES.  Patient not taking: No sig reported   Chris Coy, MD Not Taking Active Other  triamcinolone cream (KENALOG) 0.1 % [id number]  Apply 1 application topically 2 (two) times daily as needed (skin irritation on face). Chris [name], [place]  Active             Patient Active Problem List   Diagnosis Date Noted   Left-sided weakness 10/28/2020   COPD exacerbation (HCC)    Typical atrial flutter (HCC)    Acute respiratory failure with hypoxia (HCC) 07/05/2020   CHF (congestive heart failure) (HCC) 07/04/2020   Acute exacerbation of CHF (congestive heart failure) (HCC) 06/16/2020   COPD with acute exacerbation (HCC) 06/16/2020   Influenza vaccine refused 05/06/2020    Acute on chronic combined systolic (congestive) and diastolic (congestive) heart failure (HCC) 05/05/2020   Acute decompensated heart failure (HCC) 05/04/2020   Illiteracy 05/04/2020   Chronic respiratory failure with hypoxia, on home oxygen therapy (HCC) 12/28/2019   Type 2 diabetes mellitus with stage 3 chronic kidney disease (HCC) 12/25/2019   Acute and chronic respiratory failure (acute-on-chronic) (HCC) 12/25/2019   Acute on chronic combined systolic and diastolic CHF (congestive heart failure) (HCC) 10/26/2019   Elevated troponin I level 10/26/2019   Acute on chronic diastolic (congestive) heart failure (HCC) 10/26/2019   History of gout 02/01/2019   Seasonal allergic rhinitis due to pollen 02/01/2019   Tobacco dependence 11/30/2018   Microscopic hematuria 11/30/2018   Depression 11/30/2018   Difficulty controlling anger 11/30/2018   CAP (community acquired pneumonia) 08/11/2018   COPD (chronic obstructive pulmonary disease) (HCC)  CKD (chronic kidney disease) stage 3, GFR 30-59 ml/min (HCC) 08/10/2018   Recurrent epistaxis 04/21/2018   Mixed hyperlipidemia 07/28/2017   Essential hypertension 07/28/2017   Chronic systolic heart failure (HCC) 10/25/2014   Cocaine abuse (HCC) 02/20/2013   Cannabis abuse 02/20/2013   Back pain, chronic 02/20/2013    Conditions to be addressed/monitored per PCP order:  Anxiety and Depression  Care Plan : General Social Work (Adult)  Updates made by Gustavus Bryant, LCSW since 11/25/2020 12:00 AM     Problem: Depression Identification (Depression)      Long-Range Goal: Ongoing depression   Start Date: 10/07/2020  Expected End Date: 12/07/2020  Priority: High  Note:   Timeframe:  Long-Range Goal Priority:  High Start Date:    10/07/20                        Expected End Date:  12/07/20                    Follow Up Date 12/11/20   - begin personal counseling - call and visit an old friend - check out volunteer opportunities - join a  support group - laugh; watch a funny movie or comedian - learn and use visualization or guided imagery - perform a random act of kindness - practice relaxation or meditation daily - start or continue a personal journal - talk about feelings with a friend, family or spiritual advisor - practice positive thinking and self-talk    Why is this important?   When you are stressed, down or upset, your body reacts too.  For example, your blood pressure may get higher; you may have a headache or stomachache.  When your emotions get the best of you, your body's ability to fight off cold and flu gets weak.  These steps will help you manage your emotions.   Current Barriers:  Chronic Mental Health needs related to depression and need for housing Limited social support, Housing barriers, and Mental Health Concerns  Social Isolation ADL IADL limitations Suicidal Ideation/Homicidal Ideation: No  Clinical Social Work Goal(s):  Over the next 120 days, patient will work with SW monthly by telephone or in person to reduce or manage symptoms related to depression patient will work with [name] to address needs related to finding stable housing but patient was made aware that housing resources are very limited at this time  Interventions: Patient interviewed and appropriate assessments performed: brief mental health assessment PHQ 2 PHQ 9 SDOH Interventions    Flowsheet Row Most Recent Value  SDOH Interventions   SDOH Interventions for the Following Domains Housing  Housing Interventions Other (Comment)  [Referral for housing support]  Depression Interventions/Treatment  Patient refuses Treatment      Patient interviewed and appropriate assessments performed Discussed plans with patient for ongoing care management follow up and provided patient with direct contact information for care management team Assisted patient/caregiver with obtaining information about health plan benefits Encouraged patient  to consider a mental health provider for long term follow up and therapy/counseling but patient declined. Patient reports that does not need a psychiatry referral at this time. He reports that his main source of depression is from not being able to find a place on his own. He is currently stable and residing with his brother and his brother's family in their home. Patient reports that his brother leaves for work at 3 pm during the week and gets back home around 1  am. [employer] number provided to patient. Patient reports that his sister has found him a house to relocate in. This house is inherited from the family and will need some additional work done. Patient reports that this house has mold in it but he rather reside there instead of living with his brother. Patient is currently stable and reports no urgent concerns at this time. [facility] LCSW completed care coordination with [facility] [name] on 11/25/20.  Patient reports recent agitation due to his inability to secure housing on this own. [facility] LCSW provided emotional support and brief housing resource education. Patient is currently not on any public housing wait list.   Solution-Focused Strategies, Mindfulness or Relaxation Training, Active listening / Reflection utilized , Emotional Supportive Provided, and Verbalization of feelings encouraged  Patient declines any current substance use. He reports that his last use of cocaine was over 6 months ago. Patient reports that he has developed social anxiety. He admits that he gets irritable at times and will snap at his loved ones and then will experience guilt afterwards. Educated patient on coping methods to implement into his daily life to combat anxiety symptoms and stress. Patient denied any current suicidal or homicidal ideations. Encouraged patient to implement deep breathing and grounding exercises into his daily routine due to ongoing anxiety and SOB.  Patient is now agreeable to mental health  support and referrals. [facility] LCSW placed referral for counseling and medication management through the [place] platform. Patient was advised to expect a call from their program to initiate services. Update from Jarmon Chopp reported already being in care with a behavioral health provider outside of [facility]'s system. [facility] will no longer be reaching out to [employer]. If you would like to restart the referral, confirm you have communicated with the client through a progress update or by contacting [facility]'s support line at [phone]. [facility] Note Team Member at [employer] 3:36 PM 11/14/2020 [facility] spoke with Cejay who cited that they are already in care with someone they located on their own. If Giuseppe is still in need of our services, please have them contact [facility] at [phone]. [facility] Note Team Member at [employer] 11:59 AM 11/12/2020 [facility] team member matched Porter with a provider, but they have already found a provider on their own. [facility] will follow up directly with the patient to confirm that they are no longer in need of assistance. [facility] Note Team Member at [place] 10:47 AM 11/07/2020 [facility] team member spoke with Kahlan to let them know they have been matched to a provider. They were also given the provider's contact information in order to schedule an appointment. Mcneal can access the provider's contact information anytime by logging into their [facility] account. [facility] will follow up to offer this patient further support if needed. [facility] Note Team Member at [place] 12:59 PM 11/06/2020 [facility] team member sent an SMS to inform Dobie that a match has been made to a provider, Caryl Comes at [employer]. Lavel was given the provider's contact information, which can be accessed any time by logging into [facility], in order to schedule an appointment. [facility] will follow up to offer [facility] further support if needed. Patient reports that he DID NOT DECLINE  services with [facility] and has not spoke with them. [facility] LCSW advised patient to contact their number directly as they documented that he declined their services. Patient agreeable to contact them today.  Patient Self Care Activities:  Ability for insight Independent living Strong family or social support  Patient Coping Strengths:  Self Advocate Able to Communicate Effectively  Patient Self Care Deficits:  Lacks social connections  Depression screen [facility] 2/9 10/07/2020 11/01/2019 09/11/2019 07/09/2019 04/04/2019  Decreased Interest 2 2 0 1 3  Down, Depressed, Hopeless 2 3 1 1 3   PHQ - 2 Score 4 5 1 2 6   Altered sleeping 1 3 3  0 3  Tired, decreased energy 3 2 0 1 3  Change in appetite 0 - 0 1 0  Feeling bad or failure about yourself  2 2 0 0 3  Trouble concentrating 2 2 0 0 3  Moving slowly or fidgety/restless 0 - 0 0 3  Suicidal thoughts 0 0 0 0 0  PHQ-9 Score 12 14 4 4 21   Difficult doing work/chores Somewhat difficult - - - Extremely dIfficult      Follow up:  Patient agrees to Care Plan and Follow-up.  Plan: The Managed Medicaid care management team will reach out to the patient again over the next 30 days.  Date of next scheduled Social Work care management/care coordination outreach:  12/11/20  Dickie La, [name], MSW, LCSW Managed Medicaid LCSW [facility]  [facility] [email or web address] Phone: [phone]

## 2020-11-25 NOTE — Patient Instructions (Signed)
Visit Information  Chris [name] was given information about Medicaid Managed Care team care coordination services as a part of their Healthy [facility] Medicaid benefit. Chris [name] verbally consented to engagement with the [facility] Managed Care team.   For questions related to your Healthy [facility] health plan, please call: [phone] or visit the homepage here: [email or web address]  If you would like to schedule transportation through your Healthy [facility] plan, please call the following number at least 2 days in advance of your appointment: [phone]   Call the [facility] at [phone], at any time, 24 hours a day, 7 days a week. If you are in danger or need immediate medical attention call 911.  Chris [name] - following are the goals we discussed in your visit today:   Goals Addressed   None       Social Worker will follow up with patient in the next 30 days.   [name], [name], [place] Managed Medicaid Team  [phone]   Following is a copy of your plan of care:

## 2020-11-25 NOTE — Patient Instructions (Signed)
Visit Information  Chris [name] was given information about Medicaid Managed Care team care coordination services as a part of their Healthy [facility] Medicaid benefit. FELICE DEEM verbally consented to engagement with the [facility] Managed Care team.   For questions related to your Healthy [facility] health plan, please call: [phone] or visit the homepage here: [email or web address]  If you would like to schedule transportation through your Healthy [facility] plan, please call the following number at least 2 days in advance of your appointment: [phone]   Call the [facility] at [phone], at any time, 24 hours a day, 7 days a week. If you are in danger or need immediate medical attention call 911.  Chris [name] - following are the goals we discussed in your visit today:   Goals Addressed             This Visit's Progress    Manage My Emotions       Timeframe:  Long-Range Goal Priority:  High Start Date:    10/07/20                        Expected End Date:  12/07/20                    Follow Up Date 12/11/20   - begin personal counseling - call and visit an old friend - check out volunteer opportunities - join a support group - laugh; watch a funny movie or comedian - learn and use visualization or guided imagery - perform a random act of kindness - practice relaxation or meditation daily - start or continue a personal journal - talk about feelings with a friend, family or spiritual advisor - practice positive thinking and self-talk    Why is this important?   When you are stressed, down or upset, your body reacts too.  For example, your blood pressure may get higher; you may have a headache or stomachache.  When your emotions get the best of you, your body's ability to fight off cold and flu gets weak.  These steps will help you manage your emotions.   Current Barriers:  Chronic Mental Health needs  related to depression and need for housing Limited social support, Housing barriers, and Mental Health Concerns  Social Isolation ADL IADL limitations Suicidal Ideation/Homicidal Ideation: No  Clinical Social Work Goal(s):  Over the next 120 days, patient will work with SW monthly by telephone or in person to reduce or manage symptoms related to depression patient will work with [name] to address needs related to finding stable housing but patient was made aware that housing resources are very limited at this time  Interventions: Patient interviewed and appropriate assessments performed: brief mental health assessment PHQ 2 PHQ 9 SDOH Interventions    Flowsheet Row Most Recent Value  SDOH Interventions   SDOH Interventions for the Following Domains Housing  Housing Interventions Other (Comment)  [Referral for housing support]  Depression Interventions/Treatment  Patient refuses Treatment      Patient interviewed and appropriate assessments performed Discussed plans with patient for ongoing care management follow up and provided patient with direct contact information for care management team Assisted patient/caregiver with obtaining information about health plan benefits Encouraged patient to consider a mental health provider for long term follow up and therapy/counseling but patient declined. Patient reports that does not need a psychiatry referral at this time. He reports that his main source of depression is from  not being able to find a place on his own. He is currently stable and residing with his brother and his brother's family in their home. Patient reports that his brother leaves for work at 3 pm during the week and gets back home around 1 am. [employer] number provided to patient. Patient reports that his sister has found him a house to relocate in. This house is inherited from the family and will need some additional work done. Patient reports that this house has  mold in it but he rather reside there instead of living with his brother. Patient is currently stable and reports no urgent concerns at this time. [facility] LCSW completed care coordination with [facility] [name] on 10/07/20 who will follow up with patient this week. Patient reports recent agitation due to his inability to secure housing on this own. [facility] LCSW provided emotional support and brief housing resource education. Patient is currently not on any public housing wait list.   Solution-Focused Strategies, Mindfulness or Relaxation Training, Active listening / Reflection utilized , Emotional Supportive Provided, and Verbalization of feelings encouraged  Patient decline any current substance use. He reports that his last use of cocaine was over 6 months ago. Patient reports that he has developed social anxiety. He admits that he gets irritable at times and will snap at his loved ones and then will experience guilt afterwards. Educated patient on coping methods to implement into his daily life to combat anxiety symptoms and stress. Patient denied any current suicidal or homicidal ideations. Encouraged patient to implement deep breathing and grounding exercises into his daily routine due to ongoing anxiety and SOB.  Patient Self Care Activities:  Ability for insight Independent living Strong family or social support  Patient Coping Strengths:  Self Advocate Able to Communicate Effectively  Patient Self Care Deficits:  Lacks social connections  Depression screen [facility] 2/9 10/07/2020 11/01/2019 09/11/2019 07/09/2019 04/04/2019  Decreased Interest 2 2 0 1 3  Down, Depressed, Hopeless 2 3 1 1 3   PHQ - 2 Score 4 5 1 2 6   Altered sleeping 1 3 3  0 3  Tired, decreased energy 3 2 0 1 3  Change in appetite 0 - 0 1 0  Feeling bad or failure about yourself  2 2 0 0 3  Trouble concentrating 2 2 0 0 3  Moving slowly or fidgety/restless 0 - 0 0 3  Suicidal thoughts 0 0 0 0 0  PHQ-9 Score 12 14 4 4 21   Difficult doing  work/chores Somewhat difficult - - - Extremely dIfficult        Eula Fried, [name], MSW, [employer] Managed Medicaid [email or web address] Phone: [phone]

## 2020-11-26 ENCOUNTER — Telehealth: Payer: Self-pay | Admitting: Internal Medicine

## 2020-11-26 NOTE — Telephone Encounter (Signed)
Reached out to Chris [name] in regards to referral   Patient scheduled to see Dr Milinda Pointer on 8/8 /2022 @ 9 am at [facility] [address] [place] [address] Ph# [phone]

## 2020-11-26 NOTE — Progress Notes (Signed)
Patient ID: Chris [name], male    DOB: [birth year], 60 y.o.   MRN: [id number]  HPI  Chris [name] is a 60 y/o male with a history of CAD, DM, HTN, CKD, gout, depression, atrial flutter, COPD, GERD, current tobacco use and chronic heart failure.   Echo report from 06/17/20 reviewed and showed an EF of 20-25% along with mild LVH and mild Chris. TEE done 07/08/20 but unable to view report.   Admitted 10/28/20 due to left sided weakness and AMS. Neurology consult obtained. Symptoms improved and he was discharged the following day. Admitted 07/03/20 due to chest pain and shortness of breath. Hypoxic and initially placed on CPAP. Cardiology consult obtained. Initially given IV lasix with transition to oral diuretics with resultant loss of ~ 14L. Weaned off CPAP to BiPAP and then to room air. Given some solu-medrol. Discharged after 6 days. Was in the ED 06/26/20 due to shortness of breath, wheezing and pedal edema. Diuretic increased for 5 days and he was released. Admitted 06/16/20 due to HF exacerbation. Significant stress due to pending court date. Hospitalist wanted cardiology to see patient but patient refused and left AMA after 2 days.   He presents today for a follow-up visit with a chief complaint of minimal shortness of breath upon moderate exertion. He describes this as chronic in nature having been present for several months. He has associated cough and gradual weight gain along with this. He denies any difficulty sleeping, abdominal distention, palpitations, pedal edema, chest pain, dizziness or fatigue.   Says that he stopped taking bidil on his own because it was giving him headaches. Has not taken his medications yet today  Has been eating saltier foods such as [employer] earlier today.   Past Medical History:  Diagnosis Date   Arrhythmia    atrial flutter   CHF (congestive heart failure) (HCC)    Chronic kidney disease    COPD (chronic obstructive pulmonary disease) ([place])    Coronary artery  disease    Depression    Diabetes mellitus without complication ([place])    GERD (gastroesophageal reflux disease)    Gout    Hypertension    Influenza A with respiratory manifestations    Mental disorder    Past Surgical History:  Procedure Laterality Date   ANKLE SURGERY     CARDIAC CATHETERIZATION     CARDIOVERSION N/A 07/08/2020   Procedure: CARDIOVERSION;  Surgeon: Corey Skains, MD;  Location: [facility] ORS;  Service: Cardiovascular;  Laterality: N/A;   HERNIA REPAIR     x2   SHOULDER SURGERY     TEE WITHOUT CARDIOVERSION N/A 07/08/2020   Procedure: TRANSESOPHAGEAL ECHOCARDIOGRAM (TEE);  Surgeon: Corey Skains, MD;  Location: [facility] ORS;  Service: Cardiovascular;  Laterality: N/A;   Family History  Problem Relation Age of Onset   Heart disease Father    Diabetes Mother    HIV Brother    Healthy Son    Healthy Daughter    Social History   Tobacco Use   Smoking status: Every Day    Packs/day: 1.00    Years: 20.00    Pack years: 20.00    Types: Cigarettes   Smokeless tobacco: Never   Tobacco comments:    less than 1 pack per day  Substance Use Topics   Alcohol use: No   No Known Allergies  Prior to Admission medications   Medication Sig Start Date End Date Taking? Authorizing Provider  albuterol (PROVENTIL) (2.5 MG/3ML) 0.083% nebulizer solution  TAKE 3 MLS BY NEBULIZATION EVERY 6 (SIX) HOURS AS NEEDED FOR SHORTNESS OF BREATH. 11/12/20 11/12/21 Yes McClung, Dionne Bucy, PA-C  albuterol (VENTOLIN HFA) 108 (90 Base) MCG/ACT inhaler INHALE 2 PUFFS INTO THE LUNGS EVERY 6 (SIX) HOURS AS NEEDED FOR WHEEZING OR SHORTNESS OF BREATH. 11/12/20 11/12/21 Yes McClung, Dionne Bucy, PA-C  allopurinol (ZYLOPRIM) 100 MG tablet TAKE 2 TABLETS (200 MG TOTAL) BY MOUTH DAILY. 11/12/20 11/12/21 Yes McClung, Dionne Bucy, PA-C  apixaban (ELIQUIS) 5 MG TABS tablet TAKE 1 TABLET (5 MG TOTAL) BY MOUTH 2 (TWO) TIMES DAILY. 11/12/20 11/12/21 Yes McClung, Dionne Bucy, PA-C  aspirin 81 MG EC tablet TAKE 1 TABLET (81 MG TOTAL)  BY MOUTH DAILY. 11/12/20 11/12/21 Yes McClung, Dionne Bucy, PA-C  atorvastatin (LIPITOR) 40 MG tablet TAKE 1 TABLET (40 MG TOTAL) BY MOUTH DAILY AT 6 PM. 11/12/20 11/12/21 Yes McClung, Angela M, PA-C  budesonide-formoterol (SYMBICORT) 160-4.5 MCG/ACT inhaler INHALE 2 PUFFS INTO THE LUNGS 2 (TWO) TIMES DAILY. 11/12/20 11/12/21 Yes McClung, Dionne Bucy, PA-C  carvedilol (COREG) 25 MG tablet TAKE 1 TABLET (25 MG TOTAL) BY MOUTH 2 (TWO) TIMES DAILY. 11/12/20 11/12/21 Yes McClung, Dionne Bucy, PA-C  colchicine 0.6 MG tablet Take 2 tabs (1.2 mg) at the onset of a gout flare, may repeat 1 tab (0.6 mg) after 2 hours if symptoms persist. 11/12/20  Yes McClung, Angela M, PA-C  dapagliflozin propanediol (FARXIGA) 10 MG TABS tablet Take 10 mg by mouth 1 (one) time each day 11/12/20  Yes McClung, Angela M, PA-C  fluticasone (FLONASE) 50 MCG/ACT nasal spray Place 1 spray into both nostrils daily as needed for allergies or rhinitis. 11/12/20  Yes McClung, Angela M, PA-C  furosemide (LASIX) 40 MG tablet TAKE 1 TABLET (40 MG TOTAL) BY MOUTH DAILY. 11/12/20 11/12/21 Yes McClung, Dionne Bucy, PA-C  glipiZIDE (GLUCOTROL) 5 MG tablet Take 0.5 tablets (2.5 mg total) by mouth 2 (two) times daily before a meal. 11/12/20 11/12/21 Yes McClung, Angela M, PA-C  losartan (COZAAR) 100 MG tablet Take 1 tablet (100 mg total) by mouth 1 (one) time each day 11/12/20  Yes McClung, Dionne Bucy, PA-C  montelukast (SINGULAIR) 10 MG tablet TAKE 1 TABLET (10 MG TOTAL) BY MOUTH AT BEDTIME. 11/12/20 11/12/21 Yes McClung, Angela M, PA-C  triamcinolone cream (KENALOG) 0.1 % Apply 1 application topically 2 (two) times daily as needed (skin irritation on face). 11/12/20  Yes [name], PA-C  diclofenac Sodium (VOLTAREN) 1 % GEL APPLY 2 G TOPICALLY 4 (FOUR) TIMES DAILY. USE ON ELBOW PAIN Patient not taking: No sig reported 05/05/20 05/05/21  [name], MD  isosorbide-hydrALAZINE (BIDIL) 20-37.5 MG tablet TAKE 1 TABLET BY MOUTH 3 (THREE) TIMES DAILY. Patient not taking: No sig reported  07/01/20 07/01/21  [name], MD  sacubitril-valsartan (ENTRESTO) 24-26 MG Take 1 tablet by mouth 2 (two) times daily. Patient not taking: No sig reported    [provider]  sodium polystyrene (KAYEXALATE) 15 GM/60ML suspension TAKE 60 MLS (15 G TOTAL) BY MOUTH ONCE FOR 1 DOSE. Patient not taking: No sig reported 07/10/20 07/10/21  [name], MD  sodium zirconium cyclosilicate (LOKELMA) 5 g packet TAKE 5 G BY MOUTH EVERY OTHER DAY FOR 2 DOSES. Patient not taking: No sig reported 07/09/20 07/09/21  [name], MD  metoprolol tartrate (LOPRESSOR) 100 MG tablet Take 1 tablet (100 mg total) by mouth 2 (two) times daily. 07/09/20 07/11/20  [name], MD    Review of Systems  Constitutional:  Negative for  appetite change and fatigue.  HENT:  Negative for congestion, postnasal drip and sore throat.   Eyes: Negative.   Respiratory:  Positive for cough and shortness of breath. Negative for chest tightness.   Cardiovascular:  Negative for chest pain, palpitations and leg swelling.  Gastrointestinal:  Negative for abdominal distention and abdominal pain.  Endocrine: Negative.   Genitourinary: Negative.   Musculoskeletal:  Negative for back pain and neck pain.  Skin: Negative.   Allergic/Immunologic: Negative.   Neurological:  Negative for dizziness and light-headedness.  Hematological:  Negative for adenopathy. Does not bruise/bleed easily.  Psychiatric/Behavioral:  Negative for dysphoric mood and sleep disturbance (sleeping on 2 pillows; wearing oxygen at 2L at bedtime; no cpap). The patient is not nervous/anxious.    Vitals:   11/27/20 1153  BP: (!) 144/90  Pulse: 72  Resp: (!) 22  SpO2: 99%  Weight: 279 lb 8 oz (126.8 kg)  Height: 6\' 2"  (1.88 m)   Wt Readings from Last 3 Encounters:  11/27/20 279 lb 8 oz (126.8 kg)  11/12/20 271 lb 3.2 oz (123 kg)  07/17/20 259 lb 6 oz (117.7 kg)   Lab Results  Component Value Date   CREATININE 1.61 (H) 10/29/2020    CREATININE 1.40 (H) 10/28/2020   CREATININE 1.46 (H) 10/28/2020    Physical Exam Vitals and nursing note reviewed.  Constitutional:      Appearance: Normal appearance.  HENT:     Head: Normocephalic and atraumatic.  Cardiovascular:     Rate and Rhythm: Normal rate and regular rhythm.  Pulmonary:     Effort: Pulmonary effort is normal. No respiratory distress.     Breath sounds: No wheezing or rales.  Abdominal:     General: There is no distension.     Palpations: Abdomen is soft.  Musculoskeletal:        General: No tenderness.     Cervical back: Normal range of motion and neck supple.     Right lower leg: No edema.     Left lower leg: No edema.  Skin:    General: Skin is warm and dry.  Neurological:     General: No focal deficit present.     Mental Status: He is alert and oriented to person, place, and time.  Psychiatric:        Mood and Affect: Mood is anxious.        Behavior: Behavior normal.        Thought Content: Thought content normal.   Assessment & Plan:  1: Chronic heart failure with reduced ejection fraction- - NYHA class II - euvolemic today - weighing daily; reminded to call for an overnight weight gain of > 2 pounds or a weekly weight gain of > 5 pounds - weight up 20 pounds from last visit here 4 months ago; says that he's eating more - ate at [facility] prior to coming today and says that he added salt to his french fries; discussed limiting how much he eats at fast food and certainly not to add any salt to anything; instructed him to be careful with what he eats the rest of the day since he already ate very salty food - saw cardiology ([facility]) 07/11/20 - saw cardiology Clayborn Bigness) 10/03/2018; had made appt with Dr. Clayborn Bigness per his request but he didn't keep the appointment; Dr. Etta Quill office number given to the patient and instructed him to call and get any appointment scheduled.  - on GDMT of losartan, carvedilol and farxiga - potassium  too high for  spironolactone right now - BNP 07/03/20 was 1617.4 - PharmD reconciled medications with the patient  2: HTN- - BP mildly elevated today (144/90) although he hasn't taken any of his medications yet today - he says that he stopped taking bidil because it gave him headaches - saw PCP Wynetta Emery) 07/10/20 - CMP 11/11/20 reviewed and showed sodium 140, potassium 5.4, creatinine 1.87 and GFR 45 - saw nephrology Holley Raring) 11/11/20  3: COPD- - wearing oxygen at 2L at bedtime - supposed to be wearing CPAP but he says that he's unable to tolerate it  4: Tobacco use- - smoking 1/3 ppd of cigarettes - denies using alcohol - denies any cocaine use since admission on 07/03/20 - complete cessation of all substances was discussed for 3 minutes; encouraged him to continue abstaining from cocaine   Patient did not bring his medication bottles nor a list and admits that he has no idea of anything that he's taking. Will not adjust any medication today and emphasized that he needed to bring his bottles to every visit every time.   Return in 3 months or sooner for any questions/problems before then.

## 2020-11-26 NOTE — Telephone Encounter (Signed)
Contacted pt to make aware and pt states he is already of aware of appt

## 2020-11-26 NOTE — Telephone Encounter (Signed)
Copied from [place] [id number]. Topic: General - Other >> Nov 20, 2020  8:31 AM Keene Breath wrote: Reason for CRM: Patient called to ask to be referred to the pain management clinic.  Patient would like the nurse to call to check the status of the referral.  CB# [phone]

## 2020-11-27 ENCOUNTER — Ambulatory Visit: Payer: Medicaid Other | Attending: Family | Admitting: Family

## 2020-11-27 ENCOUNTER — Other Ambulatory Visit: Payer: Self-pay

## 2020-11-27 ENCOUNTER — Encounter: Payer: Self-pay | Admitting: Family

## 2020-11-27 ENCOUNTER — Encounter: Payer: Self-pay | Admitting: Pharmacist

## 2020-11-27 VITALS — BP 144/90 | HR 72 | Resp 22 | Ht 74.0 in | Wt 279.5 lb

## 2020-11-27 DIAGNOSIS — Z9981 Dependence on supplemental oxygen: Secondary | ICD-10-CM | POA: Diagnosis not present

## 2020-11-27 DIAGNOSIS — M109 Gout, unspecified: Secondary | ICD-10-CM | POA: Insufficient documentation

## 2020-11-27 DIAGNOSIS — I5020 Unspecified systolic (congestive) heart failure: Secondary | ICD-10-CM

## 2020-11-27 DIAGNOSIS — I2510 Atherosclerotic heart disease of native coronary artery without angina pectoris: Secondary | ICD-10-CM | POA: Insufficient documentation

## 2020-11-27 DIAGNOSIS — F17210 Nicotine dependence, cigarettes, uncomplicated: Secondary | ICD-10-CM | POA: Insufficient documentation

## 2020-11-27 DIAGNOSIS — Z79899 Other long term (current) drug therapy: Secondary | ICD-10-CM | POA: Insufficient documentation

## 2020-11-27 DIAGNOSIS — I130 Hypertensive heart and chronic kidney disease with heart failure and stage 1 through stage 4 chronic kidney disease, or unspecified chronic kidney disease: Secondary | ICD-10-CM | POA: Insufficient documentation

## 2020-11-27 DIAGNOSIS — J449 Chronic obstructive pulmonary disease, unspecified: Secondary | ICD-10-CM

## 2020-11-27 DIAGNOSIS — N189 Chronic kidney disease, unspecified: Secondary | ICD-10-CM | POA: Insufficient documentation

## 2020-11-27 DIAGNOSIS — Z9114 Patient's other noncompliance with medication regimen: Secondary | ICD-10-CM | POA: Insufficient documentation

## 2020-11-27 DIAGNOSIS — Z7984 Long term (current) use of oral hypoglycemic drugs: Secondary | ICD-10-CM | POA: Diagnosis not present

## 2020-11-27 DIAGNOSIS — Z7951 Long term (current) use of inhaled steroids: Secondary | ICD-10-CM | POA: Insufficient documentation

## 2020-11-27 DIAGNOSIS — I5022 Chronic systolic (congestive) heart failure: Secondary | ICD-10-CM | POA: Insufficient documentation

## 2020-11-27 DIAGNOSIS — I10 Essential (primary) hypertension: Secondary | ICD-10-CM

## 2020-11-27 DIAGNOSIS — Z7982 Long term (current) use of aspirin: Secondary | ICD-10-CM | POA: Diagnosis not present

## 2020-11-27 DIAGNOSIS — F17200 Nicotine dependence, unspecified, uncomplicated: Secondary | ICD-10-CM

## 2020-11-27 DIAGNOSIS — E1122 Type 2 diabetes mellitus with diabetic chronic kidney disease: Secondary | ICD-10-CM | POA: Insufficient documentation

## 2020-11-27 DIAGNOSIS — Z833 Family history of diabetes mellitus: Secondary | ICD-10-CM | POA: Diagnosis not present

## 2020-11-27 DIAGNOSIS — Z8249 Family history of ischemic heart disease and other diseases of the circulatory system: Secondary | ICD-10-CM | POA: Insufficient documentation

## 2020-11-27 NOTE — Progress Notes (Signed)
[facility] - PHARMACIST COUNSELING NOTE  Guideline-Directed Medical Therapy/Evidence Based Medicine  ACE/ARB/ARNI: Losartan 100 mg daily Beta Blocker: Carvedilol 25 mg twice daily Aldosterone Antagonist:  N/A Diuretic: Furosemide 40 mg daily SGLT2i: Dapagliflozin 103 mg daily  Adherence Assessment  Do you ever forget to take your medication? [] Yes [x] No  Do you ever skip doses due to side effects? [] Yes [x] No  Do you have trouble affording your medicines? [] Yes [x] No  Are you ever unable to pick up your medication due to transportation difficulties? [] Yes [x] No  Do you ever stop taking your medications because you don't believe they are helping? [] Yes [x] No   Adherence strategy: Pt endorses adherence as apart of his normal daily routine  Barriers to obtaining medications: N/A  Vital signs: HR 72, BP 144/90, weight (pounds) 279 ECHO: Date 06/17/2020, EF 20-25%, notes mild LVH  BMP Latest Ref Rng & Units 10/29/2020 10/28/2020 10/28/2020  Glucose 70 - 99 mg/dL 123(H) 80 80  BUN 6 - 20 mg/dL 17 19 16   Creatinine 0.61 - 1.24 mg/dL 1.61(H) 1.40(H) 1.46(H)  BUN/Creat Ratio 6 - 22 (calc) - - -  Sodium 135 - 145 mmol/L 136 141 140  Potassium 3.5 - 5.1 mmol/L 3.9 4.2 4.3  Chloride 98 - 111 mmol/L 103 104 103  CO2 22 - 32 mmol/L 27 - 27  Calcium 8.9 - 10.3 mg/dL 8.6(L) - 9.4    Past Medical History:  Diagnosis Date   Arrhythmia    atrial flutter   CHF (congestive heart failure) (HCC)    Chronic kidney disease    COPD (chronic obstructive pulmonary disease) (HCC)    Coronary artery disease    Depression    Diabetes mellitus without complication (HCC)    GERD (gastroesophageal reflux disease)    Gout    Hypertension    Influenza A with respiratory manifestations    Mental disorder     ASSESSMENT 60 year old male who presents to the HF clinic for a follow up visit. Pt did not bring his medications with him today and he is not 638%  certain on a few of his medications. He seemed to recognize the majority of them when I informed him of the indication. He states he takes 5 oral medications twice daily but pt only has 3 (4 if he was taking Entresto but this has not been filled since 06/2020). Based on fill history, pt looks to be on losartan and NOT Entresto. Pt also reports he does not take BiDil anymore because it gave him headaches. Unclear if his other providers are aware. I sent a message to his nephrologist (Dr. Holley Raring) to make him aware. Pt forgot to take his medications this morning and his BP was slightly elevated. I encouraged the pt to bring his bottles to future appointments so we can see which medications he is taking.   Recent ED Visit (past 6 months): Date - 06/16/2020, 06/26/2020, and 07/03/2020, [facility] - SOB; Date - 10/22/2020, [facility] - left sided weakness  PLAN CHF/HTN -continue losartan 100 mg daily, furosemide 40 mg daily, carvedilol 25 mg twice daily, dapagliflozin 10 mg daily -pt stated he had two meals from [employer] prior to his visit today; educated pt on importance of limiting salt intake -encouraged pt to bring medication bottles to future appointments  Aflutter  -HR today is 72 -continue apixaban 5 mg daily and carvedilol as above   CAD -continue aspirin 81 mg daily, clopidogrel 75 mg daily, and  atorvastatin 40 mg daily  COPD -continue Symbicort 2 puffs twice daily and albuterol as needed  Gout -continue allopurinol 200 mg daily and colchicine as needed   Time spent: 10 minutes  Sherilyn Banker, PharmD Pharmacy Resident  11/27/2020 12:10 PM     Current Outpatient Medications:    albuterol (PROVENTIL) (2.5 MG/3ML) 0.083% nebulizer solution, TAKE 3 MLS BY NEBULIZATION EVERY 6 (SIX) HOURS AS NEEDED FOR SHORTNESS OF BREATH., Disp: 90 mL, Rfl: 1   albuterol (VENTOLIN HFA) 108 (90 Base) MCG/ACT inhaler, INHALE 2 PUFFS INTO THE LUNGS EVERY 6 (SIX) HOURS AS NEEDED FOR WHEEZING OR SHORTNESS OF BREATH., Disp:  8.5 g, Rfl: 2   allopurinol (ZYLOPRIM) 100 MG tablet, TAKE 2 TABLETS (200 MG TOTAL) BY MOUTH DAILY., Disp: 60 tablet, Rfl: 4   apixaban (ELIQUIS) 5 MG TABS tablet, TAKE 1 TABLET (5 MG TOTAL) BY MOUTH 2 (TWO) TIMES DAILY., Disp: 60 tablet, Rfl: 3   aspirin 81 MG EC tablet, TAKE 1 TABLET (81 MG TOTAL) BY MOUTH DAILY., Disp: 30 tablet, Rfl: 2   atorvastatin (LIPITOR) 40 MG tablet, TAKE 1 TABLET (40 MG TOTAL) BY MOUTH DAILY AT 6 PM., Disp: 30 tablet, Rfl: 2   budesonide-formoterol (SYMBICORT) 160-4.5 MCG/ACT inhaler, INHALE 2 PUFFS INTO THE LUNGS 2 (TWO) TIMES DAILY., Disp: 10.2 g, Rfl: 2   carvedilol (COREG) 25 MG tablet, TAKE 1 TABLET (25 MG TOTAL) BY MOUTH 2 (TWO) TIMES DAILY., Disp: 60 tablet, Rfl: 3   colchicine 0.6 MG tablet, Take 2 tabs (1.2 mg) at the onset of a gout flare, may repeat 1 tab (0.6 mg) after 2 hours if symptoms persist., Disp: 30 tablet, Rfl: 1   dapagliflozin propanediol (FARXIGA) 10 MG TABS tablet, Take 10 mg by mouth 1 (one) time each day, Disp: 30 tablet, Rfl: 11   diclofenac Sodium (VOLTAREN) 1 % GEL, APPLY 2 G TOPICALLY 4 (FOUR) TIMES DAILY. USE ON ELBOW PAIN (Patient not taking: No sig reported), Disp: 50 g, Rfl: 3   fluticasone (FLONASE) 50 MCG/ACT nasal spray, Place 1 spray into both nostrils daily as needed for allergies or rhinitis., Disp: 16 g, Rfl: 2   furosemide (LASIX) 40 MG tablet, TAKE 1 TABLET (40 MG TOTAL) BY MOUTH DAILY., Disp: 90 tablet, Rfl: 2   glipiZIDE (GLUCOTROL) 5 MG tablet, Take 0.5 tablets (2.5 mg total) by mouth 2 (two) times daily before a meal., Disp: 60 tablet, Rfl: 2   isosorbide-hydrALAZINE (BIDIL) 20-37.5 MG tablet, TAKE 1 TABLET BY MOUTH 3 (THREE) TIMES DAILY. (Patient not taking: No sig reported), Disp: 90 tablet, Rfl: 4   losartan (COZAAR) 100 MG tablet, Take 1 tablet (100 mg total) by mouth 1 (one) time each day, Disp: 90 tablet, Rfl: 3   montelukast (SINGULAIR) 10 MG tablet, TAKE 1 TABLET (10 MG TOTAL) BY MOUTH AT BEDTIME., Disp: 30 tablet,  Rfl: 2   sacubitril-valsartan (ENTRESTO) 24-26 MG, Take 1 tablet by mouth 2 (two) times daily. (Patient not taking: No sig reported), Disp: , Rfl:    sodium polystyrene (KAYEXALATE) 15 GM/60ML suspension, TAKE 60 MLS (15 G TOTAL) BY MOUTH ONCE FOR 1 DOSE. (Patient not taking: No sig reported), Disp: 60 mL, Rfl: 0   sodium zirconium cyclosilicate (LOKELMA) 5 g packet, TAKE 5 G BY MOUTH EVERY OTHER DAY FOR 2 DOSES. (Patient not taking: No sig reported), Disp: 2 each, Rfl: 0   triamcinolone cream (KENALOG) 0.1 %, Apply 1 application topically 2 (two) times daily as needed (skin irritation on face)., Disp:  30 g, Rfl: 1    DRUGS TO CAUTION IN HEART FAILURE  Drug or Class Mechanism  Analgesics NSAIDs COX-2 inhibitors Glucocorticoids  Sodium and water retention, increased systemic vascular resistance, decreased response to diuretics   Diabetes Medications Metformin Thiazolidinediones Rosiglitazone (Avandia) Pioglitazone (Actos) DPP4 Inhibitors Saxagliptin (Onglyza) Sitagliptin (Januvia)   Lactic acidosis Possible calcium channel blockade   Unknown  Antiarrhythmics Class I  Flecainide Disopyramide Class III Sotalol Other Dronedarone  Negative inotrope, proarrhythmic   Proarrhythmic, beta blockade  Negative inotrope  Antihypertensives Alpha Blockers Doxazosin Calcium Channel Blockers Diltiazem Verapamil Nifedipine Central Alpha Adrenergics Moxonidine Peripheral Vasodilators Minoxidil  Increases renin and aldosterone  Negative inotrope    Possible sympathetic withdrawal  Unknown  Anti-infective Itraconazole Amphotericin B  Negative inotrope Unknown  Hematologic Anagrelide Cilostazol   Possible inhibition of PD IV Inhibition of PD III causing arrhythmias  Neurologic/Psychiatric Stimulants Anti-Seizure  Drugs Carbamazepine Pregabalin Antidepressants Tricyclics Citalopram Parkinsons Bromocriptine Pergolide Pramipexole Antipsychotics Clozapine Antimigraine Ergotamine Methysergide Appetite suppressants Bipolar Lithium  Peripheral alpha and beta agonist activity  Negative inotrope and chronotrope Calcium channel blockade  Negative inotrope, proarrhythmic Dose-dependent QT prolongation  Excessive serotonin activity/valvular damage Excessive serotonin activity/valvular damage Unknown  IgE mediated hypersensitivy, calcium channel blockade  Excessive serotonin activity/valvular damage Excessive serotonin activity/valvular damage Valvular damage  Direct myofibrillar degeneration, adrenergic stimulation  Antimalarials Chloroquine Hydroxychloroquine Intracellular inhibition of lysosomal enzymes  Urologic Agents Alpha Blockers Doxazosin Prazosin Tamsulosin Terazosin  Increased renin and aldosterone  Adapted from Page Carleene Overlie, et al. "Drugs That May Cause or Exacerbate Heart Failure: A Scientific Statement from the American Heart  Association." Circulation 2016; 134:e32-e69. DOI: 10.1161/CIR.0000000000000426   MEDICATION ADHERENCES TIPS AND STRATEGIES Taking medication as prescribed improves patient outcomes in heart failure (reduces hospitalizations, improves symptoms, increases survival) Side effects of medications can be managed by decreasing doses, switching agents, stopping drugs, or adding additional therapy. Please let someone in the [facility] know if you have having bothersome side effects so we can modify your regimen. Do not alter your medication regimen without talking to [place].  Medication reminders can help patients remember to take drugs on time. If you are missing or forgetting doses you can try linking behaviors, using pill boxes, or an electronic reminder like an alarm on your phone or an app. Some people can also get automated phone calls as medication  reminders.

## 2020-11-27 NOTE — Patient Instructions (Addendum)
Continue weighing daily and call for an overnight weight gain of > 2 pounds or a weekly weight gain of >5 pounds.    Call Dr. Etta Quill office at [phone]

## 2020-11-28 NOTE — Patient Outreach (Signed)
Asked PCP and Cardio for meds to be sent to [facility] ASAP

## 2020-11-30 ENCOUNTER — Other Ambulatory Visit: Payer: Self-pay | Admitting: Internal Medicine

## 2020-11-30 DIAGNOSIS — E669 Obesity, unspecified: Secondary | ICD-10-CM

## 2020-11-30 DIAGNOSIS — E1169 Type 2 diabetes mellitus with other specified complication: Secondary | ICD-10-CM

## 2020-11-30 DIAGNOSIS — J439 Emphysema, unspecified: Secondary | ICD-10-CM

## 2020-11-30 DIAGNOSIS — M109 Gout, unspecified: Secondary | ICD-10-CM

## 2020-11-30 DIAGNOSIS — I10 Essential (primary) hypertension: Secondary | ICD-10-CM

## 2020-11-30 DIAGNOSIS — I5042 Chronic combined systolic (congestive) and diastolic (congestive) heart failure: Secondary | ICD-10-CM

## 2020-11-30 MED ORDER — ASPIRIN 81 MG PO TBEC: 81.0000 mg | DELAYED_RELEASE_TABLET | Freq: Every day | ORAL | 2 refills | Status: DC

## 2020-11-30 MED ORDER — FUROSEMIDE 40 MG PO TABS
40.0000 mg | ORAL_TABLET | Freq: Every day | ORAL | 3 refills | Status: DC
Start: 2020-11-30 — End: 2020-12-01

## 2020-11-30 MED ORDER — ALBUTEROL SULFATE (2.5 MG/3ML) 0.083% IN NEBU
INHALATION_SOLUTION | RESPIRATORY_TRACT | 1 refills | Status: DC
  Filled 2021-02-12 – 2021-08-13 (×2): qty 90, 8d supply, fill #0

## 2020-11-30 MED ORDER — ALLOPURINOL 100 MG PO TABS
ORAL_TABLET | Freq: Every day | ORAL | 3 refills | Status: DC
  Filled 2021-02-12 – 2021-07-08 (×2): qty 180, 90d supply, fill #0

## 2020-11-30 MED ORDER — ATORVASTATIN CALCIUM 40 MG PO TABS
40.0000 mg | ORAL_TABLET | Freq: Every day | ORAL | 3 refills | Status: DC
  Filled 2021-02-12 – 2021-07-08 (×2): qty 90, 90d supply, fill #0

## 2020-11-30 MED ORDER — CARVEDILOL 25 MG PO TABS: 25.0000 mg | ORAL_TABLET | Freq: Two times a day (BID) | ORAL | 3 refills | Status: DC

## 2020-11-30 MED ORDER — DAPAGLIFLOZIN PROPANEDIOL 10 MG PO TABS: 10.0000 mg | ORAL_TABLET | Freq: Every day | ORAL | 3 refills | Status: DC

## 2020-11-30 MED ORDER — COLCHICINE 0.6 MG PO TABS
ORAL_TABLET | ORAL | 1 refills | Status: DC
  Filled 2021-02-12: qty 30, 30d supply, fill #0

## 2020-11-30 MED ORDER — LOSARTAN POTASSIUM 100 MG PO TABS: ORAL_TABLET | ORAL | 3 refills | Status: DC

## 2020-11-30 MED ORDER — BUDESONIDE-FORMOTEROL FUMARATE 160-4.5 MCG/ACT IN AERO
2.0000 | INHALATION_SPRAY | Freq: Two times a day (BID) | RESPIRATORY_TRACT | 12 refills | Status: DC
  Filled 2021-02-12: qty 10.2, 30d supply, fill #0
  Filled 2021-05-21: qty 10.2, 30d supply, fill #1
  Filled 2021-06-19 – 2021-08-13 (×2): qty 10.2, 30d supply, fill #0
  Filled 2021-10-09: qty 10.2, 30d supply, fill #1

## 2020-11-30 MED ORDER — GLIPIZIDE 5 MG PO TABS
2.5000 mg | ORAL_TABLET | Freq: Two times a day (BID) | ORAL | 3 refills | Status: DC
  Filled 2021-02-12 – 2021-07-08 (×2): qty 90, 90d supply, fill #0

## 2020-11-30 MED ORDER — ALBUTEROL SULFATE HFA 108 (90 BASE) MCG/ACT IN AERS
2.0000 | INHALATION_SPRAY | Freq: Four times a day (QID) | RESPIRATORY_TRACT | 12 refills | Status: DC | PRN
Start: 2020-11-30 — End: 2021-05-21
  Filled 2021-02-12: qty 8.5, 25d supply, fill #0
  Filled 2021-05-21: qty 8.5, 25d supply, fill #1

## 2020-12-01 ENCOUNTER — Other Ambulatory Visit: Payer: Self-pay | Admitting: Family

## 2020-12-01 ENCOUNTER — Other Ambulatory Visit: Payer: Self-pay | Admitting: Internal Medicine

## 2020-12-01 DIAGNOSIS — E669 Obesity, unspecified: Secondary | ICD-10-CM

## 2020-12-01 DIAGNOSIS — I5042 Chronic combined systolic (congestive) and diastolic (congestive) heart failure: Secondary | ICD-10-CM

## 2020-12-01 DIAGNOSIS — I10 Essential (primary) hypertension: Secondary | ICD-10-CM

## 2020-12-01 MED ORDER — FUROSEMIDE 40 MG PO TABS
40.0000 mg | ORAL_TABLET | Freq: Every day | ORAL | 3 refills | Status: DC
  Filled 2021-02-12 – 2021-07-08 (×2): qty 90, 90d supply, fill #0

## 2020-12-01 MED ORDER — APIXABAN 5 MG PO TABS
ORAL_TABLET | Freq: Two times a day (BID) | ORAL | 1 refills | Status: DC
  Filled 2021-02-12: qty 180, 90d supply, fill #0
  Filled 2021-07-08: qty 34, 17d supply, fill #0

## 2020-12-01 MED ORDER — DAPAGLIFLOZIN PROPANEDIOL 10 MG PO TABS
10.0000 mg | ORAL_TABLET | Freq: Every day | ORAL | 3 refills | Status: DC
  Filled 2021-02-12 – 2021-07-08 (×2): qty 90, 90d supply, fill #0

## 2020-12-01 MED ORDER — LOSARTAN POTASSIUM 100 MG PO TABS: ORAL_TABLET | ORAL | 3 refills | Status: DC

## 2020-12-01 MED ORDER — CARVEDILOL 6.25 MG PO TABS
6.2500 mg | ORAL_TABLET | Freq: Two times a day (BID) | ORAL | 3 refills | Status: DC
  Filled 2021-02-12 – 2021-07-08 (×2): qty 180, 90d supply, fill #0

## 2020-12-01 MED ORDER — CARVEDILOL 25 MG PO TABS: 25.0000 mg | ORAL_TABLET | Freq: Two times a day (BID) | ORAL | 3 refills | Status: DC

## 2020-12-01 NOTE — Progress Notes (Signed)
Will start patient back on carvedilol 6.25mg  dose and titrate up to 25mg .

## 2020-12-01 NOTE — Patient Outreach (Signed)
When speaking on the phone to [facility] last week, I alerted her that patient hasn't picked up any of his Cardio meds since Jan of 2021. At subsequent visit with [facility] patient didn't bring in meds (Most likely because he is out of stock and hasn't picked any up since Jan 2021). This is why I had contacted Jackelyn Hoehn over the phone previous to visit, to prevent this issue from happening. Before her visit with the patient, I asked provider to send whatever medicines she deems the patient should be on to [facility]. She did not contact me back so I msg'd her again.   Per [facility], she did not want to send in new scripts of medicines because she, "Does know what the patient is taking." This patient is scheduled a f/u for Sept with Darylene Price so patient will go without any Cardio meds for 3 months. Will msg Dr. Wynetta Emery ASAP to determine what she'd like to do.

## 2020-12-03 ENCOUNTER — Other Ambulatory Visit: Payer: Self-pay

## 2020-12-03 ENCOUNTER — Other Ambulatory Visit: Payer: Self-pay | Admitting: *Deleted

## 2020-12-03 DIAGNOSIS — Z139 Encounter for screening, unspecified: Secondary | ICD-10-CM

## 2020-12-03 NOTE — Patient Outreach (Signed)
Medicaid Managed Care   Nurse Care Manager Note  12/03/2020 Name:  Chris [name] MRN:  [id number] DOB:  01-31-1961  Chris [name] is an 60 y.o. year old male who is a primary patient of [name], MD.  The [facility] Managed Care Coordination team was consulted for assistance with:    CHF HTN DMII  [name] was given information about Medicaid Managed Care Coordination team services today. [name] agreed to services and verbal consent obtained.  Engaged with patient by telephone for follow up visit in response to provider referral for case management and/or care coordination services.   Assessments/Interventions:  Review of past medical history, allergies, medications, health status, including review of consultants reports, laboratory and other test data, was performed as part of comprehensive evaluation and provision of chronic care management services.  SDOH (Social Determinants of Health) assessments and interventions performed:   Care Plan  No Known Allergies  Medications Reviewed Today     Reviewed by Alisa Graff, FNP (Family Nurse Practitioner) on 11/27/20 at 1230  Med List Status: <None>   Medication Order Taking? Sig Documenting Provider Last Dose Status Informant  albuterol (PROVENTIL) (2.5 MG/3ML) 0.083% nebulizer solution [id number] Yes TAKE 3 MLS BY NEBULIZATION EVERY 6 (SIX) HOURS AS NEEDED FOR SHORTNESS OF BREATH. Freeman Caldron M, PA-C Taking Active   albuterol (VENTOLIN HFA) 108 (90 Base) MCG/ACT inhaler [id number] Yes INHALE 2 PUFFS INTO THE LUNGS EVERY 6 (SIX) HOURS AS NEEDED FOR WHEEZING OR SHORTNESS OF BREATH. Freeman Caldron M, PA-C Taking Active   allopurinol (ZYLOPRIM) 100 MG tablet [id number] Yes TAKE 2 TABLETS (200 MG TOTAL) BY MOUTH DAILY. Freeman Caldron M, PA-C Taking Active   apixaban (ELIQUIS) 5 MG TABS tablet [id number] Yes TAKE 1 TABLET (5 MG TOTAL) BY MOUTH 2 (TWO) TIMES DAILY. [name], [place] Taking Active   aspirin 81  MG EC tablet [id number] Yes TAKE 1 TABLET (81 MG TOTAL) BY MOUTH DAILY. [name], [place] Taking Active   atorvastatin (LIPITOR) 40 MG tablet [id number] Yes TAKE 1 TABLET (40 MG TOTAL) BY MOUTH DAILY AT 6 PM. [name], PA-C Taking Active   budesonide-formoterol [facility]) 160-4.5 MCG/ACT inhaler [id number] Yes INHALE 2 PUFFS INTO THE LUNGS 2 (TWO) TIMES DAILY. Freeman Caldron M, PA-C Taking Active   carvedilol (COREG) 25 MG tablet [id number] Yes TAKE 1 TABLET (25 MG TOTAL) BY MOUTH 2 (TWO) TIMES DAILY. Freeman Caldron M, PA-C Taking Active   colchicine 0.6 MG tablet [id number] Yes Take 2 tabs (1.2 mg) at the onset of a gout flare, may repeat 1 tab (0.6 mg) after 2 hours if symptoms persist. [name], PA-C Taking Active   dapagliflozin propanediol (FARXIGA) 10 MG TABS tablet [id number] Yes Take 10 mg by mouth 1 (one) time each day Freeman Caldron M, PA-C Taking Active   diclofenac Sodium (VOLTAREN) 1 % GEL [id number] No APPLY 2 G TOPICALLY 4 (FOUR) TIMES DAILY. USE ON ELBOW PAIN  Patient not taking: No sig reported   [name], MD Not Taking Active   fluticasone (FLONASE) 50 MCG/ACT nasal spray [id number] Yes Place 1 spray into both nostrils daily as needed for allergies or rhinitis. Freeman Caldron M, PA-C Taking Active   furosemide (LASIX) 40 MG tablet [id number] Yes TAKE 1 TABLET (40 MG TOTAL) BY MOUTH DAILY. Freeman Caldron M, PA-C Taking Active   glipiZIDE (GLUCOTROL) 5 MG tablet [id number] Yes Take 0.5 tablets (2.5 mg total) by mouth 2 (two) times daily  before a meal. [name], PA-C Taking Active   isosorbide-hydrALAZINE (BIDIL) 20-37.5 MG tablet [id number] No TAKE 1 TABLET BY MOUTH 3 (THREE) TIMES DAILY.  Patient not taking: No sig reported   [name], MD Not Taking Active   losartan (COZAAR) 100 MG tablet [id number] Yes Take 1 tablet (100 mg total) by mouth 1 (one) time each day [name] Taking Active   Discontinued 07/11/20 0943 (Discontinued  by provider)   montelukast (SINGULAIR) 10 MG tablet [id number] Yes TAKE 1 TABLET (10 MG TOTAL) BY MOUTH AT BEDTIME. Freeman Caldron M, PA-C Taking Active   sacubitril-valsartan (ENTRESTO) 24-26 MG [id number] No Take 1 tablet by mouth 2 (two) times daily.  Patient not taking: No sig reported   [provider] Not Taking Active   sodium polystyrene (KAYEXALATE) 15 GM/60ML suspension [id number] No TAKE 60 MLS (15 G TOTAL) BY MOUTH ONCE FOR 1 DOSE.  Patient not taking: No sig reported   [name], MD Not Taking Active   sodium zirconium cyclosilicate (LOKELMA) 5 g packet [id number] No TAKE 5 G BY MOUTH EVERY OTHER DAY FOR 2 DOSES.  Patient not taking: No sig reported   [name], MD Not Taking Active   triamcinolone cream (KENALOG) 0.1 % [id number] Yes Apply 1 application topically 2 (two) times daily as needed (skin irritation on face). [name], [place] Taking Active             Patient Active Problem List   Diagnosis Date Noted   Left-sided weakness 10/28/2020   COPD exacerbation (HCC)    Typical atrial flutter (HCC)    Acute respiratory failure with hypoxia ([place]) 07/05/2020   CHF (congestive heart failure) ([place]) 07/04/2020   Acute exacerbation of CHF (congestive heart failure) ([place]) 06/16/2020   COPD with acute exacerbation ([place]) 06/16/2020   Influenza vaccine refused 05/06/2020   Acute on chronic combined systolic (congestive) and diastolic (congestive) heart failure ([place]) 05/05/2020   Acute decompensated heart failure ([place]) 05/04/2020   Illiteracy 05/04/2020   Chronic respiratory failure with hypoxia, on home oxygen therapy ([place]) 12/28/2019   Type 2 diabetes mellitus with stage 3 chronic kidney disease ([place]) 12/25/2019   Acute and chronic respiratory failure (acute-on-chronic) ([place]) 12/25/2019   Acute on chronic combined systolic and diastolic CHF (congestive heart failure) ([place]) 10/26/2019   Elevated troponin I level 10/26/2019   Acute on chronic  diastolic (congestive) heart failure ([place]) 10/26/2019   History of gout 02/01/2019   Seasonal allergic rhinitis due to pollen 02/01/2019   Tobacco dependence 11/30/2018   Microscopic hematuria 11/30/2018   Depression 11/30/2018   Difficulty controlling anger 11/30/2018   CAP (community acquired pneumonia) 08/11/2018   COPD (chronic obstructive pulmonary disease) ([place])    CKD (chronic kidney disease) stage 3, GFR 30-59 ml/min ([place]) 08/10/2018   Recurrent epistaxis 04/21/2018   Mixed hyperlipidemia 07/28/2017   Essential hypertension 08/81/1031   Chronic systolic heart failure ([place]) 10/25/2014   Cocaine abuse ([place]) 02/20/2013   Cannabis abuse 02/20/2013   Back pain, chronic 02/20/2013    Conditions to be addressed/monitored per PCP order:  CHF, HTN, and DMII  Care Plan : Heart Failure (Adult)  Updates made by [name], RN since 12/03/2020 12:00 AM     Problem: Symptom Exacerbation (Heart Failure)      Long-Range Goal: Symptom Exacerbation Prevented or Minimized   Start Date: 07/02/2020  Expected End Date: 12/01/2020  Recent Progress: On track  Priority: High  Note:  Current Barriers:  Chronic Disease Management, support and educational needs related to management of multiple chronic diagnosis. Mr. Recupero is managing CHF, DMII, stage III CKD and chronic respiratory failure. He missed his last appointment with his PCP and cardiologist. He reports eating right, checking his blood sugar daily and taking all of his medications. He recently moved and is living with his brother, desires his own place. Mr. Rucinski had a recent visit to the ED for pain and weakness. He reports feeling better. He is unable to focus on his health due to his unhappiness with his current living situation. He is living with his brother, but he only wants a place of his own. Update-Mr. Briles reports now living on his own in his uncles house. He reports that the house needs work, but he will save the money  and do the work. He reports feeling less stress now. Now he can focus on his health. He does need food benefits, states that he was denied due to "charges". He does not have transportation, but he is able to walk to a grocery store, [employer] and [employer]. Nurse Case Manager Clinical Goal(s):  patient will work with Jackelyn Poling [facility] to address needs related to housing-Mr. Minnie reports no assistance available patient will meet with RN Care Manager to address needs related to managing CHF and DMII patient will demonstrate improved health management independence as evidenced bytaking prescribed medications, reporting any changes in symptoms to PCP such as swelling, changes in breathing, new cough Patient will work with [facility] pharmacist, Ovid Curd for medication management Patient will work with Jerene Pitch, LCSW for mental health-[facility] Patient will work with Ubaldo Glassing for housing insecurity-Patient currently has is own place Interventions:  Inter-disciplinary care team collaboration (see longitudinal plan of care) Evaluation of current treatment plan related to CHF and DMII and patient's adherence to plan as established by provider. Advised patient to follow up with all scheduled appointments. RNCM will assist with scheduling transportation to next appointment at [place] with [facility] for food insecurity Discussed plans with patient for ongoing care management follow up and provided patient with direct contact information for care management team Provided therapeutic listening Patient Goals/Self-Care Activities - continue to work on smoking cessation - eat more whole grains, fruits and vegetables, lean meats and healthy fats -Call to fill prescriptions one week before I run out of medication -Take all medications as prescribed - call office if I gain more than 2 pounds in one day or 5 pounds in one week - use salt in moderation - watch for swelling in feet,  ankles and legs every day  - eat more whole grains, fruits and vegetables, lean meats and healthy fats - know when to call the doctor - track symptoms and what helps feel better or worse - dress right for the weather, hot or cold - eat a heart healthy diet, low fat, no salt - contact provider with any concerns or questions - attend all scheduled appointments Follow Up Plan: Telephone follow up appointment with Managed Medicaid care management team member scheduled for:12/10/20 @ 4pm        Follow Up:  Patient agrees to Care Plan and Follow-up.  Plan: The Managed Medicaid care management team will reach out to the patient again over the next 7 days.  Date/time of next scheduled RN care management/care coordination outreach:  12/10/20 @ 4pm  [name] RN, BSN [place]  [employer] manager

## 2020-12-03 NOTE — Patient Instructions (Signed)
Visit Information  Chris [name] was given information about Medicaid Managed Care team care coordination services as a part of their Healthy [facility] Medicaid benefit. Chris [name] verbally consented to engagement with the [facility] Managed Care team.   For questions related to your Healthy [facility] health plan, please call: [phone] or visit the homepage here: [email or web address]  If you would like to schedule transportation through your Healthy [facility] plan, please call the following number at least 2 days in advance of your appointment: [phone]   Call the [facility] at [phone], at any time, 24 hours a day, 7 days a week. If you are in danger or need immediate medical attention call 911.  Chris [name] - following are the goals we discussed in your visit today:   Goals Addressed             This Visit's Progress    Track and Manage Fluids and Swelling-Heart Failure       Timeframe:  Long-Range Goal Priority:  High Start Date: 07/02/20                         Expected End Date: 01/30/21      Follow up 12/10/20              - eat a heart healthy diet, low fat, no salt -Call to fill prescriptions one week before I run out of medication -Take all medications as prescribed - call office if I gain more than 2 pounds in one day or 5 pounds in one week - use salt in moderation - watch for swelling in feet, ankles and legs every day    Why is this important?   It is important to check your weight daily and watch how much salt and liquids you have.  It will help you to manage your heart failure.          Track and Manage Symptoms-Heart Failure       Timeframe:  Long-Range Goal Priority:  Medium Start Date:  07/02/20                           Expected End Date: 01/30/21                     Follow up 12/10/20     - continue to work on smoking cessation - eat more whole grains, fruits and vegetables, lean  meats and healthy fats - know when to call the doctor - track symptoms and what helps feel better or worse - dress right for the weather, hot or cold  - contact provider with any concerns or questions - attend all scheduled appointments   Why is this important?   You will be able to handle your symptoms better if you keep track of them.  Making some simple changes to your lifestyle will help.  Eating healthy is one thing you can do to take good care of yourself.             Please see education materials related to heart failure provided as print materials.   The patient verbalized understanding of instructions provided today and agreed to receive a mailed copy of patient instruction and/or educational materials.  Telephone follow up appointment with Managed Medicaid care management team member scheduled for:12/10/20 @ [place] RN, [place] RN Care  Coordinator   Following is a copy of your plan of care:  Patient Care Plan: Heart Failure (Adult)     Problem Identified: Symptom Exacerbation (Heart Failure)      Long-Range Goal: Symptom Exacerbation Prevented or Minimized   Start Date: 07/02/2020  Expected End Date: 12/01/2020  Recent Progress: On track  Priority: High  Note:   Current Barriers:  Chronic Disease Management, support and educational needs related to management of multiple chronic diagnosis. Chris [name] is managing CHF, DMII, stage III CKD and chronic respiratory failure. He missed his last appointment with his PCP and cardiologist. He reports eating right, checking his blood sugar daily and taking all of his medications. He recently moved and is living with his brother, desires his own place. Chris [name] had a recent visit to the ED for pain and weakness. He reports feeling better. He is unable to focus on his health due to his unhappiness with his current living situation. He is living with his brother, but he only wants  a place of his own. Update-Chris [name] reports now living on his own in his uncles house. He reports that the house needs work, but he will save the money and do the work. He reports feeling less stress now. Now he can focus on his health. He does need food benefits, states that he was denied due to "charges". He does not have transportation, but he is able to walk to a grocery store, [employer] and [employer]. Nurse Case Manager Clinical Goal(s):  patient will work with Jackelyn Poling [facility] to address needs related to housing-Chris [name] reports no assistance available patient will meet with RN Care Manager to address needs related to managing CHF and DMII patient will demonstrate improved health management independence as evidenced bytaking prescribed medications, reporting any changes in symptoms to PCP such as swelling, changes in breathing, new cough Patient will work with MM pharmacist, Ovid Curd for medication management Patient will work with Jerene Pitch, LCSW for mental health-Met Patient will work with Ubaldo Glassing for housing insecurity-Patient currently has is own place Interventions:  Inter-disciplinary care team collaboration (see longitudinal plan of care) Evaluation of current treatment plan related to CHF and DMII and patient's adherence to plan as established by provider. Advised patient to follow up with all scheduled appointments. RNCM will assist with scheduling transportation to next appointment at [place] with [place] for food insecurity Discussed plans with patient for ongoing care management follow up and provided patient with direct contact information for care management team Provided therapeutic listening Patient Goals/Self-Care Activities - continue to work on smoking cessation - eat more whole grains, fruits and vegetables, lean meats and healthy fats -Call to fill prescriptions one week before I run out of medication -Take all  medications as prescribed - call office if I gain more than 2 pounds in one day or 5 pounds in one week - use salt in moderation - watch for swelling in feet, ankles and legs every day  - eat more whole grains, fruits and vegetables, lean meats and healthy fats - know when to call the doctor - track symptoms and what helps feel better or worse - dress right for the weather, hot or cold - eat a heart healthy diet, low fat, no salt - contact provider with any concerns or questions - attend all scheduled appointments Follow Up Plan: Telephone follow up appointment with Managed Medicaid care management team member scheduled for:12/10/20 @ 4pm  Patient Care Plan: General Social Work (Adult)     Problem Identified: Homelessness   Onset Date: 07/09/2020  Note:   Chris [name] is a 60 y.o. year old male who sees [name], MD for primary care. The  Medicaid Managed Care team was consulted for assistance with Housing barriers. Mr. Rodda was given information about Care Management services, agreed to services, and verbal consent for services was obtained.  Interventions:  Patient interviewed and appropriate assessments performed Collaborated with clinical team regarding patient needs  SDOH (Social Determinants of Health) assessments performed: Yes     Provided patient with information about the [facility]. [name] contacted [facility] to complete a referral for patient to get into the homeless motel. [name] left a voicemail. [name] informed patient that the process can take a long time. Patient states he does not know why no one wants to help him. [name] informed patient he may be able to go to a shelter in [facility], patient states he does not feel safe in [place].  [name] contacted [employer] in [place] [phone] and left a voicemail for the intake coordinator. [name] contacted [place] to speak with a Education officer, museum but kept getting transferred.  Patient states he  does not have any money to get a room and become frustrated again stating that we "[place]"do not want to help him. [name] informed patient that she will wait for the [facility] and/or the [facility] to contact her back. [name] informed patient she could provide him with the information for [facility] but patient did not want to call. Update 07/22/20: Patient stated he is doing okay since his surgery but still wants somewhere else to live. Patient stated he knows of a boarding house on [address] and would like SW to help him get in it. SW researched and could not find a boarding house on [address], Patient stated he would get the name of it and contact [name] with the information. Update 10/14/20: [name] contacted patient to complete a follow up. Patient states he is currently living with his brother but is ready to be on his own. He states he sister has found a house for him to stay but it needs a lot of work. Patient is asking for assistance with programs that help disabled with fixing up homes. Patient states the home is in [place] and needs windows and flooring done. [name] will research programs that assists with home repairs/modeling. Patient stated he is going to the beach May 6-18. [name] will follow up with patient on May 23 at 3pm. Update 5/26: [name] received a telephone call from patient. Patient states that he is ready to move. [name] provided patient with information for programs that may be able to help with renovations to the home in [place]. [name] informed patient that since his uncle owns the home, his uncle will have to call. [employer] [phone] and the [facility] [phone]. Update: 11/25/20: [name] received a telephone call back from patient. He states he has moved but needs assistance with getting the floor fixed, a new stove , refrigerator and furniture. [name] informed patient she would see if she could locate some resources that will help with the furniture. [name] already  provided resources for getting the floor fixed, patient states he has not contacted those resources yet.  Plan:  Over the next 30-60 days, patient will work with [name] to address needs related to Housing barriers Education officer, museum will continue to follow up with  patient.   [name], [name], [place]  High Risk Managed Medicaid Team          Problem Identified: Depression Identification (Depression)      Long-Range Goal: Ongoing depression   Start Date: 10/07/2020  Expected End Date: 12/07/2020  Priority: High  Note:   Timeframe:  Long-Range Goal Priority:  High Start Date:    10/07/20                        Expected End Date:  12/07/20                    Follow Up Date 12/11/20   - begin personal counseling - call and visit an old friend - check out volunteer opportunities - join a support group - laugh; watch a funny movie or comedian - learn and use visualization or guided imagery - perform a random act of kindness - practice relaxation or meditation daily - start or continue a personal journal - talk about feelings with a friend, family or spiritual advisor - practice positive thinking and self-talk    Why is this important?   When you are stressed, down or upset, your body reacts too.  For example, your blood pressure may get higher; you may have a headache or stomachache.  When your emotions get the best of you, your body's ability to fight off cold and flu gets weak.  These steps will help you manage your emotions.   Current Barriers:  Chronic Mental Health needs related to depression and need for housing Limited social support, Housing barriers, and Mental Health Concerns  Social Isolation ADL IADL limitations Suicidal Ideation/Homicidal Ideation: No  Clinical Social Work Goal(s):  Over the next 120 days, patient will work with SW monthly by telephone or in person to reduce or manage symptoms related to depression patient will  work with [name] to address needs related to finding stable housing but patient was made aware that housing resources are very limited at this time  Interventions: Patient interviewed and appropriate assessments performed: brief mental health assessment PHQ 2 PHQ 9 SDOH Interventions    Flowsheet Row Most Recent Value  SDOH Interventions   SDOH Interventions for the Following Domains Housing  Housing Interventions Other (Comment)  [Referral for housing support]  Depression Interventions/Treatment  Patient refuses Treatment      Patient interviewed and appropriate assessments performed Discussed plans with patient for ongoing care management follow up and provided patient with direct contact information for care management team Assisted patient/caregiver with obtaining information about health plan benefits Encouraged patient to consider a mental health provider for long term follow up and therapy/counseling but patient declined. Patient reports that does not need a psychiatry referral at this time. He reports that his main source of depression is from not being able to find a place on his own. He is currently stable and residing with his brother and his brother's family in their home. Patient reports that his brother leaves for work at 3 pm during the week and gets back home around 1 am. [employer] number provided to patient. Patient reports that his sister has found him a house to relocate in. This house is inherited from the family and will need some additional work done. Patient reports that this house has mold in it but he rather reside there instead of living with his brother. Patient is currently stable and reports  no urgent concerns at this time. [facility] LCSW completed care coordination with [facility] [name] on 11/25/20.  Patient reports recent agitation due to his inability to secure housing on this own. [facility] LCSW provided emotional support and brief housing resource education. Patient  is currently not on any public housing wait list.   Solution-Focused Strategies, Mindfulness or Relaxation Training, Active listening / Reflection utilized , Emotional Supportive Provided, and Verbalization of feelings encouraged  Patient declines any current substance use. He reports that his last use of cocaine was over 6 months ago. Patient reports that he has developed social anxiety. He admits that he gets irritable at times and will snap at his loved ones and then will experience guilt afterwards. Educated patient on coping methods to implement into his daily life to combat anxiety symptoms and stress. Patient denied any current suicidal or homicidal ideations. Encouraged patient to implement deep breathing and grounding exercises into his daily routine due to ongoing anxiety and SOB.  Patient is now agreeable to mental health support and referrals. [facility] LCSW placed referral for counseling and medication management through the [facility]. Patient was advised to expect a call from their program to initiate services. Update from Chris [name] reported already being in care with a behavioral health provider outside of [facility]'s system. [facility] will no longer be reaching out to [employer]. If you would like to restart the referral, confirm you have communicated with the client through a progress update or by contacting [facility]'s support line at [phone]. [facility] Note Team Member at [employer] 3:36 PM 11/14/2020 [facility] spoke with Chris [name] who cited that they are already in care with someone they located on their own. If Chris [name] is still in need of our services, please have them contact [place] at [phone]. [facility] Note Team Member at [employer] 11:59 AM 11/12/2020 [facility] team member matched [place] with a provider, but they have already found a provider on their own. [facility] will follow up directly with the patient to confirm that they are no longer in need of assistance. [facility]  Note Team Member at [place] 10:47 AM 11/07/2020 [facility] team member spoke with Chris [name] to let them know they have been matched to a provider. They were also given the provider's contact information in order to schedule an appointment. Chris [name] can access the provider's contact information anytime by logging into their [facility] account. [facility] will follow up to offer this patient further support if needed. [facility] Note Team Member at [place] 12:59 PM 11/06/2020 [facility] team member sent an SMS to inform Chris [name] that a match has been made to a provider, Franki Cabot at [facility]. Jaecob was given the provider's contact information, which can be accessed any time by logging into [facility], in order to schedule an appointment. [facility] will follow up to offer [facility] further support if needed. Patient reports that he DID NOT DECLINE services with [facility] and has not spoke with them. [facility] LCSW advised patient to contact their number directly as they documented that he declined their services. Patient agreeable to contact them today.  Patient Self Care Activities:  Ability for insight Independent living Strong family or social support  Patient Coping Strengths:  Self Advocate Able to Communicate Effectively  Patient Self Care Deficits:  Lacks social connections  Depression screen [facility] 2/9 10/07/2020 11/01/2019 09/11/2019 07/09/2019 04/04/2019  Decreased Interest 2 2 0 1 3  Down, Depressed, Hopeless 2 3 1 1 3   PHQ - 2 Score 4 5 1 2 6   Altered sleeping 1 3  3 0 3  Tired, decreased energy 3 2 0 1 3  Change in appetite 0 - 0 1 0  Feeling bad or failure about yourself  2 2 0 0 3  Trouble concentrating 2 2 0 0 3  Moving slowly or fidgety/restless 0 - 0 0 3  Suicidal thoughts 0 0 0 0 0  PHQ-9 Score 12 14 4 4 21   Difficult doing work/chores Somewhat difficult - - - Extremely dIfficult      Patient Care Plan: Medication Management     Problem Identified: Health Promotion or Disease Self-Management  (General Plan of Care)      Goal: Medication Management   Note:   Current Barriers:  Does not adhere to prescribed medication regimen Does not maintain contact with provider office Does not contact provider office for questions/concerns   Pharmacist Clinical Goal(s):  Over the next 30 days, patient will contact provider office for questions/concerns as evidenced notation of same in electronic health record through collaboration with PharmD and provider.    Interventions: Inter-disciplinary care team collaboration (see longitudinal plan of care) Comprehensive medication review performed; medication list updated in electronic medical record  @RXCPDIABETES @ @RXCPHYPERTENSION @ @RXCPHYPERLIPIDEMIA @  Patient Goals/Self-Care Activities Over the next 30 days, patient will:  - collaborate with provider on medication access solutions  Follow Up Plan: The care management team will reach out to the patient again over the next 30 days.

## 2020-12-08 ENCOUNTER — Telehealth: Payer: Self-pay | Admitting: *Deleted

## 2020-12-08 NOTE — Telephone Encounter (Signed)
   Telephone encounter was:  Successful.  12/08/2020 Name: DECARI DUGGAR MRN: [id number] DOB: [birth year]  ZALMEN WRIGHTSMAN is a 60 y.o. year old male who is a primary care patient of [name], MD . The community resource team was consulted for assistance with Transportation Needs  and [place] guide performed the following interventions: Patient provided with information about care guide support team and interviewed to confirm resource needs.  Follow Up Plan:  Care guide will follow up with patient by phone over the next day  [place], Care Management  [phone] [address] , [place] [address] Email : [email or web address]

## 2020-12-10 ENCOUNTER — Other Ambulatory Visit: Payer: Self-pay | Admitting: *Deleted

## 2020-12-10 ENCOUNTER — Other Ambulatory Visit: Payer: Self-pay

## 2020-12-10 NOTE — Patient Instructions (Signed)
Visit Information  Chris [name] was given information about Medicaid Managed Care team care coordination services as a part of their Healthy [facility] Medicaid benefit. Chris [name]  to engagement with the [facility] Managed Care team.   For questions related to your Healthy [facility] health plan, please call: [phone] or visit the homepage here: [email or web address]  If you would like to schedule transportation through your Healthy [facility] plan, please call the following number at least 2 days in advance of your appointment: [phone]   Call the [facility] at [phone], at any time, 24 hours a day, 7 days a week. If you are in danger or need immediate medical attention call 911.  Chris [name] - following are the goals we discussed in your visit today:   Goals Addressed             This Visit's Progress    Track and Manage Fluids and Swelling-Heart Failure       Timeframe:  Long-Range Goal Priority:  High Start Date: 07/02/20                         Expected End Date: 01/30/21      Follow up 12/25/20              - eat a heart healthy diet, low fat, no salt -Call to fill prescriptions one week before I run out of medication -Take all medications as prescribed - call office if I gain more than 2 pounds in one day or 5 pounds in one week - use salt in moderation - watch for swelling in feet, ankles and legs every day    Why is this important?   It is important to check your weight daily and watch how much salt and liquids you have.  It will help you to manage your heart failure.          Track and Manage Symptoms-Heart Failure       Timeframe:  Long-Range Goal Priority:  Medium Start Date:  07/02/20                           Expected End Date: 01/30/21                     Follow up 12/25/20     - call and make an appointment with Cardiology-Dr. Clayborn Bigness [phone] - continue to work on smoking cessation -  eat more whole grains, fruits and vegetables, lean meats and healthy fats - know when to call the doctor - track symptoms and what helps feel better or worse - dress right for the weather, hot or cold  - contact provider with any concerns or questions - attend all scheduled appointments   Why is this important?   You will be able to handle your symptoms better if you keep track of them.  Making some simple changes to your lifestyle will help.  Eating healthy is one thing you can do to take good care of yourself.             Please see education materials related to heart failure provided as print materials.   The patient verbalized understanding of instructions provided today and agreed to receive a mailed copy of patient instruction and/or educational materials.  Telephone follow up appointment with Managed Medicaid care management team member scheduled for:12/25/20 @ 3:30pm  [name] RN,  BSN [facility] RN Care Coordinator   Following is a copy of your plan of care:  Patient Care Plan: Heart Failure (Adult)     Problem Identified: Symptom Exacerbation (Heart Failure)      Long-Range Goal: Symptom Exacerbation Prevented or Minimized   Start Date: 07/02/2020  Expected End Date: 01/30/2021  Recent Progress: On track  Priority: High  Note:   Current Barriers:  Chronic Disease Management, support and educational needs related to management of multiple chronic diagnosis. Chris [name] is managing CHF, DMII, stage III CKD and chronic respiratory failure. He missed his last appointment with his PCP and cardiologist. He reports eating right, checking his blood sugar daily and taking all of his medications. He recently moved and is living with his brother, desires his own place. Chris [name] had a recent visit to the ED for pain and weakness. He reports feeling better. He is unable to focus on his health due to his unhappiness with his current living  situation. He is living with his brother, but he only wants a place of his own. Chris [name] reports now living on his own in his uncles house. He reports that the house needs work, but he will save the money and do the work. He reports feeling less stress now. Now he can focus on his health. He does need food benefits, states that he was denied due to "charges". He does not have transportation, but he is able to walk to a grocery store, [place] and [employer].-Update-Today Chris [name] shared with RNCM that he is needing a new nebulizer and glucometer, both are not working correctly. He reports having his medications, but is unable to review them while he is out walking to get exercise.  Nurse Case Manager Clinical Goal(s):  patient will work with [facility] to address needs related to housing-Chris [name] reports no assistance available patient will meet with RN Care Manager to address needs related to managing CHF and DMII patient will demonstrate improved health management independence as evidenced bytaking prescribed medications, reporting any changes in symptoms to PCP such as swelling, changes in breathing, new cough Patient will work with MM pharmacist, Ovid Curd for medication management Patient will work with Jerene Pitch, LCSW for mental health-[facility] Patient will work with Ubaldo Glassing for housing insecurity-Patient currently has is own place Interventions:  Inter-disciplinary care team collaboration (see longitudinal plan of care) Evaluation of current treatment plan related to CHF and DMII and patient's adherence to plan as established by provider. Advised patient to call and schedule an appointment with Cardiology-Dr. Clayborn Bigness [phone] Advised patient to follow up with all scheduled appointments. RNCM will assist with scheduling transportation to next appointment at [place] with [place] for food insecurity Discussed plans with patient for ongoing  care management follow up and provided patient with direct contact information for care management team Provided therapeutic listening Collaborated with PCP for nebulizer and glucometer Patient Goals/Self-Care Activities - call and make an appointment with Cardiology-Dr. Clayborn Bigness [phone] - continue to work on smoking cessation - eat more whole grains, fruits and vegetables, lean meats and healthy fats -Call to fill prescriptions one week before I run out of medication -Take all medications as prescribed - call office if I gain more than 2 pounds in one day or 5 pounds in one week - use salt in moderation - watch for swelling in feet, ankles and legs every day  - eat more whole grains, fruits and vegetables,  lean meats and healthy fats - know when to call the doctor - track symptoms and what helps feel better or worse - dress right for the weather, hot or cold - eat a heart healthy diet, low fat, no salt - contact provider with any concerns or questions - attend all scheduled appointments Follow Up Plan: Telephone follow up appointment with Managed Medicaid care management team member scheduled for:12/25/20 @ 3:30pm       Patient Care Plan: General Social Work (Adult)     Problem Identified: Homelessness   Onset Date: 07/09/2020  Note:   Chris [name] is a 60 y.o. year old male who sees [name], MD for primary care. The  Medicaid Managed Care team was consulted for assistance with Housing barriers. Mr. Wiechmann was given information about Care Management services, agreed to services, and verbal consent for services was obtained.  Interventions:  Patient interviewed and appropriate assessments performed Collaborated with clinical team regarding patient needs  SDOH (Social Determinants of Health) assessments performed: Yes     Provided patient with information about the [facility]. [name] contacted [facility] to complete a referral for patient to get into the  homeless motel. [name] left a voicemail. [name] informed patient that the process can take a long time. Patient states he does not know why no one wants to help him. [name] informed patient he may be able to go to a shelter in [facility], patient states he does not feel safe in [place].  [name] contacted [employer] in [place] [phone] and left a voicemail for the intake coordinator. [name] contacted [place] to speak with a Education officer, museum but kept getting transferred.  Patient states he does not have any money to get a room and become frustrated again stating that we "[place]"do not want to help him. [name] informed patient that she will wait for the [facility] and/or the [employer] to contact her back. [name] informed patient she could provide him with the information for [facility] but patient did not want to call. Update 07/22/20: Patient stated he is doing okay since his surgery but still wants somewhere else to live. Patient stated he knows of a boarding house on [address] and would like SW to help him get in it. SW researched and could not find a boarding house on [address], Patient stated he would get the name of it and contact [name] with the information. Update 10/14/20: [name] contacted patient to complete a follow up. Patient states he is currently living with his brother but is ready to be on his own. He states he sister has found a house for him to stay but it needs a lot of work. Patient is asking for assistance with programs that help disabled with fixing up homes. Patient states the home is in [place] and needs windows and flooring done. [name] will research programs that assists with home repairs/modeling. Patient stated he is going to the beach May 6-18. [name] will follow up with patient on May 23 at 3pm. Update 5/26: [name] received a telephone call from patient. Patient states that he is ready to move. [name] provided patient with information for programs that may be able to help with  renovations to the home in [place]. [name] informed patient that since his uncle owns the home, his uncle will have to call. [employer] [phone] and the [facility] [phone]. Update: 11/25/20: [name] received a telephone call back from patient. He states he has moved but  needs assistance with getting the floor fixed, a new stove , refrigerator and furniture. [name] informed patient she would see if she could locate some resources that will help with the furniture. [name] already provided resources for getting the floor fixed, patient states he has not contacted those resources yet.  Plan:  Over the next 30-60 days, patient will work with [name] to address needs related to Housing barriers Education officer, museum will continue to follow up with patient.   [name], [name], [place]  High Risk Managed Medicaid Team          Problem Identified: Depression Identification (Depression)      Long-Range Goal: Ongoing depression   Start Date: 10/07/2020  Expected End Date: 12/07/2020  Priority: High  Note:   Timeframe:  Long-Range Goal Priority:  High Start Date:    10/07/20                        Expected End Date:  12/07/20                    Follow Up Date 12/11/20   - begin personal counseling - call and visit an old friend - check out volunteer opportunities - join a support group - laugh; watch a funny movie or comedian - learn and use visualization or guided imagery - perform a random act of kindness - practice relaxation or meditation daily - start or continue a personal journal - talk about feelings with a friend, family or spiritual advisor - practice positive thinking and self-talk    Why is this important?   When you are stressed, down or upset, your body reacts too.  For example, your blood pressure may get higher; you may have a headache or stomachache.  When your emotions get the best of you, your body's ability to fight off  cold and flu gets weak.  These steps will help you manage your emotions.   Current Barriers:  Chronic Mental Health needs related to depression and need for housing Limited social support, Housing barriers, and Mental Health Concerns  Social Isolation ADL IADL limitations Suicidal Ideation/Homicidal Ideation: No  Clinical Social Work Goal(s):  Over the next 120 days, patient will work with SW monthly by telephone or in person to reduce or manage symptoms related to depression patient will work with [name] to address needs related to finding stable housing but patient was made aware that housing resources are very limited at this time  Interventions: Patient interviewed and appropriate assessments performed: brief mental health assessment PHQ 2 PHQ 9 SDOH Interventions    Flowsheet Row Most Recent Value  SDOH Interventions   SDOH Interventions for the Following Domains Housing  Housing Interventions Other (Comment)  [Referral for housing support]  Depression Interventions/Treatment  Patient refuses Treatment      Patient interviewed and appropriate assessments performed Discussed plans with patient for ongoing care management follow up and provided patient with direct contact information for care management team Assisted patient/caregiver with obtaining information about health plan benefits Encouraged patient to consider a mental health provider for long term follow up and therapy/counseling but patient declined. Patient reports that does not need a psychiatry referral at this time. He reports that his main source of depression is from not being able to find a place on his own. He is currently stable and residing with his brother and his brother's family in their home. Patient reports that  his brother leaves for work at 3 pm during the week and gets back home around 1 am. [employer] number provided to patient. Patient reports that his sister has found him a house to  relocate in. This house is inherited from the family and will need some additional work done. Patient reports that this house has mold in it but he rather reside there instead of living with his brother. Patient is currently stable and reports no urgent concerns at this time. [facility] LCSW completed care coordination with [facility] [name] on 11/25/20.  Patient reports recent agitation due to his inability to secure housing on this own. [facility] LCSW provided emotional support and brief housing resource education. Patient is currently not on any public housing wait list.   Solution-Focused Strategies, Mindfulness or Relaxation Training, Active listening / Reflection utilized , Emotional Supportive Provided, and Verbalization of feelings encouraged  Patient declines any current substance use. He reports that his last use of cocaine was over 6 months ago. Patient reports that he has developed social anxiety. He admits that he gets irritable at times and will snap at his loved ones and then will experience guilt afterwards. Educated patient on coping methods to implement into his daily life to combat anxiety symptoms and stress. Patient denied any current suicidal or homicidal ideations. Encouraged patient to implement deep breathing and grounding exercises into his daily routine due to ongoing anxiety and SOB.  Patient is now agreeable to mental health support and referrals. [facility] LCSW placed referral for counseling and medication management through the [facility]. Patient was advised to expect a call from their program to initiate services. Update from Ching Rabideau reported already being in care with a behavioral health provider outside of [facility]'s system. [facility] will no longer be reaching out to [employer]. If you would like to restart the referral, confirm you have communicated with the client through a progress update or by contacting [facility]'s support line at [phone]. [facility] Note Team Member at  [employer] 3:36 PM 11/14/2020 [facility] spoke with Youssef who cited that they are already in care with someone they located on their own. If [place] is still in need of our services, please have them contact [place] at [phone]. [facility] Note Team Member at [employer] 11:59 AM 11/12/2020 [facility] team member matched [place] with a provider, but they have already found a provider on their own. [facility] will follow up directly with the patient to confirm that they are no longer in need of assistance. [facility] Note Team Member at [place] 10:47 AM 11/07/2020 [facility] team member spoke with Brittin to let them know they have been matched to a provider. They were also given the provider's contact information in order to schedule an appointment. Trysten can access the provider's contact information anytime by logging into their [facility] account. [facility] will follow up to offer this patient further support if needed. [facility] Note Team Member at [place] 12:59 PM 11/06/2020 [facility] team member sent an SMS to inform [place] that a match has been made to a provider, Franki Cabot at [facility]. [place] was given the provider's contact information, which can be accessed any time by logging into [facility], in order to schedule an appointment. [facility] will follow up to offer [facility] further support if needed. Patient reports that he DID NOT DECLINE services with [facility] and has not spoke with them. [facility] LCSW advised patient to contact their number directly as they documented that he declined their services. Patient agreeable to contact them today.  Patient Self Care Activities:  Ability for insight Independent living Strong family or social support  Patient Coping Strengths:  Self Advocate Able to Communicate Effectively  Patient Self Care Deficits:  Lacks social connections  Depression screen [facility] 2/9 10/07/2020 11/01/2019 09/11/2019 07/09/2019 04/04/2019  Decreased Interest 2 2 0 1 3  Down, Depressed, Hopeless 2 3 1 1  3   PHQ - 2 Score 4 5 1 2 6   Altered sleeping 1 3 3  0 3  Tired, decreased energy 3 2 0 1 3  Change in appetite 0 - 0 1 0  Feeling bad or failure about yourself  2 2 0 0 3  Trouble concentrating 2 2 0 0 3  Moving slowly or fidgety/restless 0 - 0 0 3  Suicidal thoughts 0 0 0 0 0  PHQ-9 Score 12 14 4 4 21   Difficult doing work/chores Somewhat difficult - - - Extremely dIfficult     Task: Identify Depressive Symptoms and Facilitate Treatment   Note:   Care Management Activities:    - depression screen reviewed - participation in psychiatric services encouraged    Notes:     Patient Care Plan: Medication Management     Problem Identified: Health Promotion or Disease Self-Management (General Plan of Care)      Goal: Medication Management   Note:   Current Barriers:  Does not adhere to prescribed medication regimen Does not maintain contact with provider office Does not contact provider office for questions/concerns   Pharmacist Clinical Goal(s):  Over the next 30 days, patient will contact provider office for questions/concerns as evidenced notation of same in electronic health record through collaboration with PharmD and provider.    Interventions: Inter-disciplinary care team collaboration (see longitudinal plan of care) Comprehensive medication review performed; medication list updated in electronic medical record  @RXCPDIABETES @ @RXCPHYPERTENSION @ @RXCPHYPERLIPIDEMIA @  Patient Goals/Self-Care Activities Over the next 30 days, patient will:  - collaborate with provider on medication access solutions  Follow Up Plan: The care management team will reach out to the patient again over the next 30 days.

## 2020-12-10 NOTE — Patient Outreach (Signed)
Medicaid Managed Care   Nurse Care Manager Note  12/10/2020 Name:  Chris [name] MRN:  [id number] DOB:  [birth year]  Chris [name] is an 60 y.o. year old male who is a primary patient of Chris Pier, MD.  The [facility] Managed Care Coordination team was consulted for assistance with:    CHF  Chris [name] was given information about Medicaid Managed Care Coordination team services today. [name] agreed to services and verbal consent obtained.  Engaged with patient by telephone for follow up visit in response to provider referral for case management and/or care coordination services.   Assessments/Interventions:  Review of past medical history, allergies, medications, health status, including review of consultants reports, laboratory and other test data, was performed as part of comprehensive evaluation and provision of chronic care management services.  SDOH (Social Determinants of Health) assessments and interventions performed:   Care Plan  No Known Allergies  Medications Reviewed Today     Reviewed by Alisa Graff, FNP (Family Nurse Practitioner) on 11/27/20 at 1230  Med List Status: <None>   Medication Order Taking? Sig Documenting Provider Last Dose Status Informant  albuterol (PROVENTIL) (2.5 MG/3ML) 0.083% nebulizer solution [id number] Yes TAKE 3 MLS BY NEBULIZATION EVERY 6 (SIX) HOURS AS NEEDED FOR SHORTNESS OF BREATH. Freeman Caldron M, PA-C Taking Active   albuterol (VENTOLIN HFA) 108 (90 Base) MCG/ACT inhaler [id number] Yes INHALE 2 PUFFS INTO THE LUNGS EVERY 6 (SIX) HOURS AS NEEDED FOR WHEEZING OR SHORTNESS OF BREATH. Freeman Caldron M, PA-C Taking Active   allopurinol (ZYLOPRIM) 100 MG tablet [id number] Yes TAKE 2 TABLETS (200 MG TOTAL) BY MOUTH DAILY. Freeman Caldron M, PA-C Taking Active   apixaban (ELIQUIS) 5 MG TABS tablet [id number] Yes TAKE 1 TABLET (5 MG TOTAL) BY MOUTH 2 (TWO) TIMES DAILY. [name], [place] Taking Active   aspirin 81 MG EC tablet  [id number] Yes TAKE 1 TABLET (81 MG TOTAL) BY MOUTH DAILY. [name], [place] Taking Active   atorvastatin (LIPITOR) 40 MG tablet [id number] Yes TAKE 1 TABLET (40 MG TOTAL) BY MOUTH DAILY AT 6 PM. [name], PA-C Taking Active   budesonide-formoterol [facility]) 160-4.5 MCG/ACT inhaler [id number] Yes INHALE 2 PUFFS INTO THE LUNGS 2 (TWO) TIMES DAILY. Freeman Caldron M, PA-C Taking Active   carvedilol (COREG) 25 MG tablet [id number] Yes TAKE 1 TABLET (25 MG TOTAL) BY MOUTH 2 (TWO) TIMES DAILY. Freeman Caldron M, PA-C Taking Active   colchicine 0.6 MG tablet [id number] Yes Take 2 tabs (1.2 mg) at the onset of a gout flare, may repeat 1 tab (0.6 mg) after 2 hours if symptoms persist. [name], PA-C Taking Active   dapagliflozin propanediol (FARXIGA) 10 MG TABS tablet [id number] Yes Take 10 mg by mouth 1 (one) time each day Freeman Caldron M, PA-C Taking Active   diclofenac Sodium (VOLTAREN) 1 % GEL [id number] No APPLY 2 G TOPICALLY 4 (FOUR) TIMES DAILY. USE ON ELBOW PAIN  Patient not taking: No sig reported   [name], MD Not Taking Active   fluticasone (FLONASE) 50 MCG/ACT nasal spray [id number] Yes Place 1 spray into both nostrils daily as needed for allergies or rhinitis. Freeman Caldron M, PA-C Taking Active   furosemide (LASIX) 40 MG tablet [id number] Yes TAKE 1 TABLET (40 MG TOTAL) BY MOUTH DAILY. Freeman Caldron M, PA-C Taking Active   glipiZIDE (GLUCOTROL) 5 MG tablet [id number] Yes Take 0.5 tablets (2.5 mg total) by mouth 2 (two) times daily before a  meal. [name], PA-C Taking Active   isosorbide-hydrALAZINE (BIDIL) 20-37.5 MG tablet [id number] No TAKE 1 TABLET BY MOUTH 3 (THREE) TIMES DAILY.  Patient not taking: No sig reported   Chris Pier, MD Not Taking Active   losartan (COZAAR) 100 MG tablet [id number] Yes Take 1 tablet (100 mg total) by mouth 1 (one) time each day [name] Taking Active   Discontinued 07/11/20 0943 (Discontinued by provider)    montelukast (SINGULAIR) 10 MG tablet [id number] Yes TAKE 1 TABLET (10 MG TOTAL) BY MOUTH AT BEDTIME. Freeman Caldron M, PA-C Taking Active   sacubitril-valsartan (ENTRESTO) 24-26 MG [id number] No Take 1 tablet by mouth 2 (two) times daily.  Patient not taking: No sig reported   [provider] Not Taking Active   sodium polystyrene (KAYEXALATE) 15 GM/60ML suspension [id number] No TAKE 60 MLS (15 G TOTAL) BY MOUTH ONCE FOR 1 DOSE.  Patient not taking: No sig reported   Chris Pier, MD Not Taking Active   sodium zirconium cyclosilicate (LOKELMA) 5 g packet [id number] No TAKE 5 G BY MOUTH EVERY OTHER DAY FOR 2 DOSES.  Patient not taking: No sig reported   [name], MD Not Taking Active   triamcinolone cream (KENALOG) 0.1 % [id number] Yes Apply 1 application topically 2 (two) times daily as needed (skin irritation on face). [name], [place] Taking Active             Patient Active Problem List   Diagnosis Date Noted   Left-sided weakness 10/28/2020   COPD exacerbation (HCC)    Typical atrial flutter (HCC)    Acute respiratory failure with hypoxia ([place]) 07/05/2020   CHF (congestive heart failure) ([place]) 07/04/2020   Acute exacerbation of CHF (congestive heart failure) ([place]) 06/16/2020   COPD with acute exacerbation ([place]) 06/16/2020   Influenza vaccine refused 05/06/2020   Acute on chronic combined systolic (congestive) and diastolic (congestive) heart failure ([place]) 05/05/2020   Acute decompensated heart failure ([place]) 05/04/2020   Illiteracy 05/04/2020   Chronic respiratory failure with hypoxia, on home oxygen therapy ([place]) 12/28/2019   Type 2 diabetes mellitus with stage 3 chronic kidney disease ([place]) 12/25/2019   Acute and chronic respiratory failure (acute-on-chronic) ([place]) 12/25/2019   Acute on chronic combined systolic and diastolic CHF (congestive heart failure) ([place]) 10/26/2019   Elevated troponin I level 10/26/2019   Acute on chronic diastolic  (congestive) heart failure ([place]) 10/26/2019   History of gout 02/01/2019   Seasonal allergic rhinitis due to pollen 02/01/2019   Tobacco dependence 11/30/2018   Microscopic hematuria 11/30/2018   Depression 11/30/2018   Difficulty controlling anger 11/30/2018   CAP (community acquired pneumonia) 08/11/2018   COPD (chronic obstructive pulmonary disease) ([place])    CKD (chronic kidney disease) stage 3, GFR 30-59 ml/min ([place]) 08/10/2018   Recurrent epistaxis 04/21/2018   Mixed hyperlipidemia 07/28/2017   Essential hypertension 75/03/2584   Chronic systolic heart failure ([place]) 10/25/2014   Cocaine abuse ([place]) 02/20/2013   Cannabis abuse 02/20/2013   Back pain, chronic 02/20/2013    Conditions to be addressed/monitored per PCP order:  CHF  Care Plan : Heart Failure (Adult)  Updates made by [name], RN since 12/10/2020 12:00 AM     Problem: Symptom Exacerbation (Heart Failure)      Long-Range Goal: Symptom Exacerbation Prevented or Minimized   Start Date: 07/02/2020  Expected End Date: 01/30/2021  Recent Progress: On track  Priority: High  Note:   Current Barriers:  Chronic  Disease Management, support and educational needs related to management of multiple chronic diagnosis. Mr. Orrego is managing CHF, DMII, stage III CKD and chronic respiratory failure. He missed his last appointment with his PCP and cardiologist. He reports eating right, checking his blood sugar daily and taking all of his medications. He recently moved and is living with his brother, desires his own place. Mr. Groeneveld had a recent visit to the ED for pain and weakness. He reports feeling better. He is unable to focus on his health due to his unhappiness with his current living situation. He is living with his brother, but he only wants a place of his own. Mr. Loe reports now living on his own in his uncles house. He reports that the house needs work, but he will save the money and do the work. He reports  feeling less stress now. Now he can focus on his health. He does need food benefits, states that he was denied due to "charges". He does not have transportation, but he is able to walk to a grocery store, [employer] and [employer].-Update-Today Mr. Talent shared with RNCM that he is needing a new nebulizer and glucometer, both are not working correctly. He reports having his medications, but is unable to review them while he is out walking to get exercise.  Nurse Case Manager Clinical Goal(s):  patient will work with Jackelyn Poling [facility] to address needs related to housing-Mr. Staples reports no assistance available patient will meet with RN Care Manager to address needs related to managing CHF and DMII patient will demonstrate improved health management independence as evidenced bytaking prescribed medications, reporting any changes in symptoms to PCP such as swelling, changes in breathing, new cough Patient will work with MM pharmacist, Ovid Curd for medication management Patient will work with Jerene Pitch, LCSW for mental health-[facility] Patient will work with U[facility] Glassing for housing insecurity-Patient currently has is own place Interventions:  Inter-disciplinary care team collaboration (see longitudinal plan of care) Evaluation of current treatment plan related to CHF and DMII and patient's adherence to plan as established by provider. Advised patient to call and schedule an appointment with Cardiology-Dr. Clayborn Bigness [phone] Advised patient to follow up with all scheduled appointments. RNCM will assist with scheduling transportation to next appointment at [place] with [place] for food insecurity Discussed plans with patient for ongoing care management follow up and provided patient with direct contact information for care management team Provided therapeutic listening Collaborated with PCP for nebulizer and glucometer Patient Goals/Self-Care Activities - call and  make an appointment with Cardiology-Dr. Clayborn Bigness [phone] - continue to work on smoking cessation - eat more whole grains, fruits and vegetables, lean meats and healthy fats -Call to fill prescriptions one week before I run out of medication -Take all medications as prescribed - call office if I gain more than 2 pounds in one day or 5 pounds in one week - use salt in moderation - watch for swelling in feet, ankles and legs every day  - eat more whole grains, fruits and vegetables, lean meats and healthy fats - know when to call the doctor - track symptoms and what helps feel better or worse - dress right for the weather, hot or cold - eat a heart healthy diet, low fat, no salt - contact provider with any concerns or questions - attend all scheduled appointments Follow Up Plan: Telephone follow up appointment with Managed Medicaid care management team member scheduled for:12/25/20 @ 3:30pm  Follow Up:  Patient agrees to Care Plan and Follow-up.  Plan: The Managed Medicaid care management team will reach out to the patient again over the next 14 days.  Date/time of next scheduled RN care management/care coordination outreach:  12/25/20 @ 3:30pm  [name] RN, [place] RN Care Coordinator

## 2020-12-11 ENCOUNTER — Telehealth: Payer: Self-pay | Admitting: Licensed Clinical Social Worker

## 2020-12-11 ENCOUNTER — Ambulatory Visit: Payer: Self-pay

## 2020-12-11 NOTE — Patient Outreach (Signed)
[facility]) Care Management  [facility] Social Work  12/11/2020  Chris [name] 1961/06/14 [id number]  Encounter Medications:  Outpatient Encounter Medications as of 12/11/2020  Medication Sig   albuterol (PROVENTIL) (2.5 MG/3ML) 0.083% nebulizer solution TAKE 3 MLS BY NEBULIZATION EVERY 6 (SIX) HOURS AS NEEDED FOR SHORTNESS OF BREATH.   albuterol (VENTOLIN HFA) 108 (90 Base) MCG/ACT inhaler INHALE 2 PUFFS INTO THE LUNGS EVERY 6 (SIX) HOURS AS NEEDED FOR WHEEZING OR SHORTNESS OF BREATH.   allopurinol (ZYLOPRIM) 100 MG tablet TAKE 2 TABLETS (200 MG TOTAL) BY MOUTH DAILY.   apixaban (ELIQUIS) 5 MG TABS tablet TAKE 1 TABLET (5 MG TOTAL) BY MOUTH 2 (TWO) TIMES DAILY.   aspirin 81 MG EC tablet Take 1 tablet (81 mg total) by mouth daily.   atorvastatin (LIPITOR) 40 MG tablet Take 1 tablet (40 mg total) by mouth daily.   budesonide-formoterol (SYMBICORT) 160-4.5 MCG/ACT inhaler Inhale 2 puffs into the lungs 2 (two) times daily.   carvedilol (COREG) 6.25 MG tablet Take 1 tablet (6.25 mg total) by mouth 2 (two) times daily.   colchicine 0.6 MG tablet Take 2 tabs (1.2 mg) at the onset of a gout flare, may repeat 1 tab (0.6 mg) after 2 hours if symptoms persist.   dapagliflozin propanediol (FARXIGA) 10 MG TABS tablet Take 1 tablet (10 mg total) by mouth daily.   diclofenac Sodium (VOLTAREN) 1 % GEL APPLY 2 G TOPICALLY 4 (FOUR) TIMES DAILY. USE ON ELBOW PAIN (Patient not taking: No sig reported)   fluticasone (FLONASE) 50 MCG/ACT nasal spray Place 1 spray into both nostrils daily as needed for allergies or rhinitis.   furosemide (LASIX) 40 MG tablet Take 1 tablet (40 mg total) by mouth daily.   glipiZIDE (GLUCOTROL) 5 MG tablet Take 0.5 tablets (2.5 mg total) by mouth 2 (two) times daily before a meal.   isosorbide-hydrALAZINE (BIDIL) 20-37.5 MG tablet TAKE 1 TABLET BY MOUTH 3 (THREE) TIMES DAILY. (Patient not taking: No sig reported)   losartan (COZAAR) 100 MG tablet Take 1 tablet (100 mg  total) by mouth 1 (one) time each day   montelukast (SINGULAIR) 10 MG tablet TAKE 1 TABLET (10 MG TOTAL) BY MOUTH AT BEDTIME.   sodium polystyrene (KAYEXALATE) 15 GM/60ML suspension TAKE 60 MLS (15 G TOTAL) BY MOUTH ONCE FOR 1 DOSE. (Patient not taking: No sig reported)   sodium zirconium cyclosilicate (LOKELMA) 5 g packet TAKE 5 G BY MOUTH EVERY OTHER DAY FOR 2 DOSES. (Patient not taking: No sig reported)   triamcinolone cream (KENALOG) 0.1 % Apply 1 application topically 2 (two) times daily as needed (skin irritation on face).   [DISCONTINUED] metoprolol tartrate (LOPRESSOR) 100 MG tablet Take 1 tablet (100 mg total) by mouth 2 (two) times daily.   No facility-administered encounter medications on file as of 12/11/2020.    Functional Status:  In your present state of health, do you have any difficulty performing the following activities: 07/17/2020 07/05/2020  Hearing? N N  Vision? N N  Difficulty concentrating or making decisions? N N  Walking or climbing stairs? N Y  Dressing or bathing? N N  Doing errands, shopping? N Y  Some recent data might be hidden    Fall/Depression Screening:  PHQ 2/9 Scores 11/12/2020 10/07/2020 11/01/2019 09/11/2019 07/09/2019 04/04/2019 11/30/2018  PHQ - 2 Score 1 4 5 1 2 6  0  PHQ- 9 Score - 12 14 4 4 21  -    LCSW completed [facility] outreach attempt today but was  unable to reach patient successfully. A HIPPA compliant voice message was left encouraging patient to return call once available. LCSW will reschedule patient's [facility] Social Work appointment if no return call has been made.  Plan:  Follow-up:  Follow-up in 35 day(s)  Dickie La, [name], MSW, LCSW Managed Medicaid LCSW [facility]  [facility] [email or web address] Phone: [phone]

## 2020-12-11 NOTE — Patient Instructions (Signed)
[name] ,   The [facility] Managed Care Team is available to provide assistance to you with your healthcare needs at no cost and as a benefit of your [facility] Health plan. Please reach out to me at the number below. I am available to be of assistance to you regarding your healthcare needs. .   Thank you,   Eula Fried, [name], MSW, LCSW Managed Medicaid [email or web address] [email or web address] Phone: [phone]

## 2020-12-12 ENCOUNTER — Other Ambulatory Visit: Payer: Self-pay | Admitting: Internal Medicine

## 2020-12-12 ENCOUNTER — Other Ambulatory Visit: Payer: Self-pay

## 2020-12-12 DIAGNOSIS — J439 Emphysema, unspecified: Secondary | ICD-10-CM

## 2020-12-12 MED ORDER — ACCU-CHEK SOFTCLIX LANCETS MISC
12 refills | Status: DC
  Filled 2021-02-12: qty 100, 30d supply, fill #0

## 2020-12-12 MED ORDER — ACCU-CHEK GUIDE VI STRP
ORAL_STRIP | 12 refills | Status: DC
  Filled 2021-02-12: qty 100, 30d supply, fill #0

## 2020-12-12 MED ORDER — ACCU-CHEK GUIDE W/DEVICE KIT: PACK | 0 refills | Status: DC

## 2020-12-17 ENCOUNTER — Other Ambulatory Visit: Payer: Self-pay

## 2020-12-18 ENCOUNTER — Other Ambulatory Visit: Payer: Self-pay

## 2020-12-18 ENCOUNTER — Telehealth: Payer: Self-pay | Admitting: *Deleted

## 2020-12-18 NOTE — Telephone Encounter (Signed)
   Telephone encounter was:  Successful.  12/18/2020 Name: Chris [name] MRN: [id number] DOB: 04/27/1961  Chris [name] is a 60 y.o. year old male who is a primary care patient of [name], MD . The community resource team was consulted for assistance with [place] guide performed the following interventions: Patient provided with information about care guide support team and interviewed to confirm resource needs Follow up call placed to community resources to determine status of patients referral.  Follow Up Plan:  no further follow needed    [place], Care Management  [phone] [address] , [place] [address] Email : [email or web address]

## 2020-12-22 ENCOUNTER — Telehealth: Payer: Self-pay | Admitting: *Deleted

## 2020-12-23 ENCOUNTER — Other Ambulatory Visit: Payer: Self-pay

## 2020-12-24 ENCOUNTER — Other Ambulatory Visit: Payer: Self-pay

## 2020-12-24 ENCOUNTER — Telehealth: Payer: Self-pay | Admitting: *Deleted

## 2020-12-24 NOTE — Patient Outreach (Signed)
Care Coordination  12/24/2020  Chris [name] 03-27-1961 [id number]   Medicaid Managed Care   Unsuccessful Outreach Note  12/24/2020 Name: Chris [name] MRN: [id number] DOB: 12/22/1960  Referred by: [name], MD Reason for referral : High Risk Managed Medicaid (MM Social Work [employer])   An unsuccessful telephone outreach was attempted today. The patient was referred to the case management team for assistance with care management and care coordination.   Follow Up Plan: The care management team will reach out to the patient again over the next 30 days.   [name], [name], [place] Managed Medicaid Team  [phone]

## 2020-12-24 NOTE — Telephone Encounter (Signed)
   Telephone encounter was:  Unsuccessful.  12/24/2020 Name: Chris [name] MRN: [id number] DOB: 05/29/1961  Unsuccessful outbound call made today to assist with:  Food Insecurity  Outreach Attempt:  3rd Attempt.  Referral closed unable to contact patient.   No answer  [place], Care Management  [phone] [address] , [place] [address] Email : [email or web address]

## 2020-12-24 NOTE — Patient Instructions (Signed)
Visit Information  [name]  - as a part of your Medicaid benefit, you are eligible for care management and care coordination services at no cost or copay. I was unable to reach you by phone today but would be happy to help you with your health related needs. Please feel free to call me @ [phone].   A member of the Managed Medicaid care management team will reach out to you again over the next 30 days.   [name], [name], [place] Managed Medicaid Team  [phone]

## 2020-12-25 ENCOUNTER — Other Ambulatory Visit: Payer: Self-pay

## 2020-12-25 ENCOUNTER — Other Ambulatory Visit: Payer: Self-pay | Admitting: *Deleted

## 2020-12-25 NOTE — Patient Instructions (Signed)
Visit Information  Chris [name] was given information about Medicaid Managed Care team care coordination services as a part of their Healthy [facility] Medicaid benefit. Chris [name] verbally consented to engagement with the [facility] Managed Care team.   For questions related to your Healthy [facility] health plan, please call: [phone] or visit the homepage here: [email or web address]  If you would like to schedule transportation through your Healthy [facility] plan, please call the following number at least 2 days in advance of your appointment: [phone]  Call the [facility] at [phone], at any time, 24 hours a day, 7 days a week. If you are in danger or need immediate medical attention call 911.  If you would like help to quit smoking, call 1-800-QUIT-NOW (910)041-7907) OR Espaol: 1-855-Djelo-Ya (3-762-831-5176) o para ms informacin haga clic aqu or Text READY to 200-400 to register via text  Chris [name] - following are the goals we discussed in your visit today:   Goals Addressed             This Visit's Progress    Track and Manage Fluids and Swelling-Heart Failure       Timeframe:  Long-Range Goal Priority:  High Start Date: 07/02/20                         Expected End Date: 01/30/21      Follow up 01/09/21              - eat a heart healthy diet, low fat, no salt -Call to fill prescriptions one week before I run out of medication -Take all medications as prescribed - call office if I gain more than 2 pounds in one day or 5 pounds in one week - use salt in moderation - watch for swelling in feet, ankles and legs every day    Why is this important?   It is important to check your weight daily and watch how much salt and liquids you have.  It will help you to manage your heart failure.         Track and Manage Symptoms-Heart Failure       Timeframe:  Long-Range Goal Priority:  Medium Start Date:   07/02/20                           Expected End Date: 01/30/21                     Follow up 01/09/21     - call and make an appointment with Cardiology-Dr. Clayborn Bigness [phone] - continue to work on smoking cessation - eat more whole grains, fruits and vegetables, lean meats and healthy fats - know when to call the doctor - track symptoms and what helps feel better or worse - dress right for the weather, hot or cold  - contact provider with any concerns or questions - attend all scheduled appointments   Why is this important?   You will be able to handle your symptoms better if you keep track of them.  Making some simple changes to your lifestyle will help.  Eating healthy is one thing you can do to take good care of yourself.            Please see education materials related to CHF and Diabetes provided by MyChart link.  Patient verbalizes understanding of instructions provided today.   Telephone follow  up appointment with Managed Medicaid care management team member scheduled for:01/09/21 @ 12:30pm  [name] RN, BSN [place] RN Care Coordinator   Following is a copy of your plan of care:  Patient Care Plan: Heart Failure (Adult)     Problem Identified: Symptom Exacerbation (Heart Failure)      Long-Range Goal: Symptom Exacerbation Prevented or Minimized   Start Date: 07/02/2020  Expected End Date: 01/30/2021  Recent Progress: On track  Priority: High  Note:   Current Barriers:  Chronic Disease Management, support and educational needs related to management of multiple chronic diagnosis. Chris [name] is managing CHF, DMII, stage III CKD and chronic respiratory failure. He missed his last appointment with his PCP and cardiologist. He reports eating right, checking his blood sugar daily and taking all of his medications. He recently moved and is living with his brother, desires his own place. Chris [name] had a recent visit to the ED for  pain and weakness. He reports feeling better. He is unable to focus on his health due to his unhappiness with his current living situation. He is living with his brother, but he only wants a place of his own. Chris [name] reports now living on his own in his uncles house. He reports that the house needs work, but he will save the money and do the work. He reports feeling less stress now. Now he can focus on his health. He does need food benefits, states that he was denied due to "charges". He does not have transportation, but he is able to walk to a grocery store, [employer] and [employer]. Fana is upset today. Reports that a family member took his bank card and he needs assistance paying his utility bill. He missed calls yesterday from [facility]. Dr. Wynetta Emery ordered a new nebulizer and glucometer on 7/1, Chris [name] has not received either at this time.  Nurse Case Manager Clinical Goal(s):  patient will work with Chris [name] to address needs related to housing-Chris [name] reports no assistance available patient will meet with RN Care Manager to address needs related to managing CHF and DMII patient will demonstrate improved health management independence as evidenced bytaking prescribed medications, reporting any changes in symptoms to PCP such as swelling, changes in breathing, new cough Patient will work with [facility] pharmacist, Chris [name] for medication management Patient will work with Chris Pitch, LCSW for mental health-[facility] Patient will work with Chris [name] for housing insecurity-Patient currently has is own place Interventions:  Inter-disciplinary care team collaboration (see longitudinal plan of care) Evaluation of current treatment plan related to CHF and DMII and patient's adherence to plan as established by provider. Advised patient to call and schedule an appointment with Cardiology-Dr. Clayborn Bigness [phone] Advised patient to follow up with all  scheduled appointments-HF clinic 7/26-Mr. Dredge reports his brother will take him Collaborated with [name] and Care Guide for resources Discussed plans with patient for ongoing care management follow up and provided patient with direct contact information for care management team Provided therapeutic listening Patient Goals/Self-Care Activities - call and make an appointment with Cardiology-Dr. Clayborn Bigness [phone] - continue to work on smoking cessation - eat more whole grains, fruits and vegetables, lean meats and healthy fats -Call to fill prescriptions one week before I run out of medication -Take all medications as prescribed - call office if I gain more than 2 pounds in one day or 5 pounds in one week - use salt in moderation -  watch for swelling in feet, ankles and legs every day  - eat more whole grains, fruits and vegetables, lean meats and healthy fats - know when to call the doctor - track symptoms and what helps feel better or worse - dress right for the weather, hot or cold - eat a heart healthy diet, low fat, no salt - contact provider with any concerns or questions - attend all scheduled appointments Follow Up Plan: Telephone follow up appointment with Managed Medicaid care management team member scheduled for:01/09/21 @ 12:30pm     Patient Care Plan: General Social Work (Adult)     Problem Identified: [name]   Onset Date: 07/09/2020  Note:   Chris [name] is a 60 y.o. year old male who sees Chris Pier, MD for primary care. The  Medicaid Managed Care team was consulted for assistance with Housing barriers. Mr. Hulbert was given information about Care Management services, agreed to services, and verbal consent for services was obtained.  Interventions:  Patient interviewed and appropriate assessments performed Collaborated with clinical team regarding patient needs  SDOH (Social Determinants of Health) assessments performed: Yes     Provided patient  with information about the [facility]. [name] contacted [facility] to complete a referral for patient to get into the homeless motel. [name] left a voicemail. [name] informed patient that the process can take a long time. Patient states he does not know why no one wants to help him. [name] informed patient he may be able to go to a shelter in [facility], patient states he does not feel safe in [place].  [name] contacted [employer] in [place] [phone] and left a voicemail for the intake coordinator. [name] contacted [place] to speak with a Education officer, museum but kept getting transferred.  Patient states he does not have any money to get a room and become frustrated again stating that we "[place]"do not want to help him. [name] informed patient that she will wait for the [facility] and/or the [facility] to contact her back. [name] informed patient she could provide him with the information for [facility] but patient did not want to call. Update 07/22/20: Patient stated he is doing okay since his surgery but still wants somewhere else to live. Patient stated he knows of a boarding house on [address] and would like SW to help him get in it. SW researched and could not find a boarding house on [address], Patient stated he would get the name of it and contact [name] with the information. Update 10/14/20: [name] contacted patient to complete a follow up. Patient states he is currently living with his brother but is ready to be on his own. He states he sister has found a house for him to stay but it needs a lot of work. Patient is asking for assistance with programs that help disabled with fixing up homes. Patient states the home is in [place] and needs windows and flooring done. [name] will research programs that assists with home repairs/modeling. Patient stated he is going to the beach May 6-18. [name] will follow up with patient on May 23 at 3pm. Update 5/26: [name] received a telephone call from patient.  Patient states that he is ready to move. [name] provided patient with information for programs that may be able to help with renovations to the home in [place]. [name] informed patient that since his uncle owns the home, his uncle will have to call. [employer] [phone] and the [facility]  [phone]. Update: 11/25/20: [name] received a telephone call back from patient. He states he has moved but needs assistance with getting the floor fixed, a new stove , refrigerator and furniture. [name] informed patient she would see if she could locate some resources that will help with the furniture. [name] already provided resources for getting the floor fixed, patient states he has not contacted those resources yet.  Plan:  Over the next 30-60 days, patient will work with [name] to address needs related to Housing barriers Education officer, museum will continue to follow up with patient.   Chris [name], [name], [place]  High Risk Managed Medicaid Team          Problem Identified: Depression Identification (Depression)      Long-Range Goal: Ongoing depression   Start Date: 10/07/2020  Expected End Date: 12/07/2020  Priority: High  Note:   Timeframe:  Long-Range Goal Priority:  High Start Date:    10/07/20                        Expected End Date:  12/07/20                    Follow Up Date 12/11/20   - begin personal counseling - call and visit an old friend - check out volunteer opportunities - join a support group - laugh; watch a funny movie or comedian - learn and use visualization or guided imagery - perform a random act of kindness - practice relaxation or meditation daily - start or continue a personal journal - talk about feelings with a friend, family or spiritual advisor - practice positive thinking and self-talk    Why is this important?   When you are stressed, down or upset, your body reacts too.  For example, your blood pressure may get  higher; you may have a headache or stomachache.  When your emotions get the best of you, your body's ability to fight off cold and flu gets weak.  These steps will help you manage your emotions.   Current Barriers:  Chronic Mental Health needs related to depression and need for housing Limited social support, Housing barriers, and Mental Health Concerns  Social Isolation ADL IADL limitations Suicidal Ideation/Homicidal Ideation: No  Clinical Social Work Goal(s):  Over the next 120 days, patient will work with SW monthly by telephone or in person to reduce or manage symptoms related to depression patient will work with [name] to address needs related to finding stable housing but patient was made aware that housing resources are very limited at this time  Interventions: Patient interviewed and appropriate assessments performed: brief mental health assessment PHQ 2 PHQ 9 SDOH Interventions    Flowsheet Row Most Recent Value  SDOH Interventions   SDOH Interventions for the Following Domains Housing  Housing Interventions Other (Comment)  [Referral for housing support]  Depression Interventions/Treatment  Patient refuses Treatment      Patient interviewed and appropriate assessments performed Discussed plans with patient for ongoing care management follow up and provided patient with direct contact information for care management team Assisted patient/caregiver with obtaining information about health plan benefits Encouraged patient to consider a mental health provider for long term follow up and therapy/counseling but patient declined. Patient reports that does not need a psychiatry referral at this time. He reports that his main source of depression is from not being able to find a place on his own. He is  currently stable and residing with his brother and his brother's family in their home. Patient reports that his brother leaves for work at 3 pm during the week and gets back home around 1  am. [employer] number provided to patient. Patient reports that his sister has found him a house to relocate in. This house is inherited from the family and will need some additional work done. Patient reports that this house has mold in it but he rather reside there instead of living with his brother. Patient is currently stable and reports no urgent concerns at this time. [facility] LCSW completed care coordination with [facility] [name] on 11/25/20.  Patient reports recent agitation due to his inability to secure housing on this own. [facility] LCSW provided emotional support and brief housing resource education. Patient is currently not on any public housing wait list.   Solution-Focused Strategies, Mindfulness or Relaxation Training, Active listening / Reflection utilized , Emotional Supportive Provided, and Verbalization of feelings encouraged  Patient declines any current substance use. He reports that his last use of cocaine was over 6 months ago. Patient reports that he has developed social anxiety. He admits that he gets irritable at times and will snap at his loved ones and then will experience guilt afterwards. Educated patient on coping methods to implement into his daily life to combat anxiety symptoms and stress. Patient denied any current suicidal or homicidal ideations. Encouraged patient to implement deep breathing and grounding exercises into his daily routine due to ongoing anxiety and SOB.  Patient is now agreeable to mental health support and referrals. [facility] LCSW placed referral for counseling and medication management through the [place]. Patient was advised to expect a call from their program to initiate services. Update from Harrie Cazarez reported already being in care with a behavioral health provider outside of [facility]'s system. [facility] will no longer be reaching out to [employer]. If you would like to restart the referral, confirm you have communicated with the client  through a progress update or by contacting [facility]'s support line at [phone]. [facility] Note Team Member at [employer] 3:36 PM 11/14/2020 [facility] spoke with Cristopher who cited that they are already in care with someone they located on their own. If Chasen is still in need of our services, please have them contact [place] at [phone]. [facility] Note Team Member at [employer] 11:59 AM 11/12/2020 [facility] team member matched [place] with a provider, but they have already found a provider on their own. [facility] will follow up directly with the patient to confirm that they are no longer in need of assistance. [facility] Note Team Member at [place] 10:47 AM 11/07/2020 [facility] team member spoke with Jar[place] to let them know they have been matched to a provider. They were also given the provider's contact information in order to schedule an appointment. [place] can access the provider's contact information anytime by logging into their [facility] account. [facility] will follow up to offer this patient further support if needed. [facility] Note Team Member at [place] 12:59 PM 11/06/2020 [facility] team member sent an SMS to inform Cassiel that a match has been made to a provider, Franki Cabot at [facility]. [place] was given the provider's contact information, which can be accessed any time by logging into [facility], in order to schedule an appointment. [facility] will follow up to offer [facility] further support if needed. Patient reports that he DID NOT DECLINE services with [facility] and has not spoke with them. [facility] LCSW advised patient to contact  their number directly as they documented that he declined their services. Patient agreeable to contact them today.  Patient Self Care Activities:  Ability for insight Independent living Strong family or social support  Patient Coping Strengths:  Self Advocate Able to Communicate Effectively  Patient Self Care Deficits:  Lacks social connections  Depression screen [facility] 2/9  10/07/2020 11/01/2019 09/11/2019 07/09/2019 04/04/2019  Decreased Interest 2 2 0 1 3  Down, Depressed, Hopeless _0 PHQ - 2 Score _1 Altered sleeping _2 0 3  Tired, decreased energy 3 2 0 1 3  Change in appetite 0 - 0 1 0  Feeling bad or failure about yourself  2 2 0 0 3  Trouble concentrating 2 2 0 0 3  Moving slowly or fidgety/restless 0 - 0 0 3  Suicidal thoughts 0 0 0 0 0  PHQ-9 Score _3 Difficult doing work/chores Somewhat difficult - - - Extremely dIfficult     Task: Identify Depressive Symptoms and Facilitate Treatment   Note:   Care Management Activities:    - depression screen reviewed - participation in psychiatric services encouraged    Notes:     Patient Care Plan: Medication Management     Problem Identified: Health Promotion or Disease Self-Management (General Plan of Care)      Goal: Medication Management   Note:   Current Barriers:  Does not adhere to prescribed medication regimen Does not maintain contact with provider office Does not contact provider office for questions/concerns   Pharmacist Clinical Goal(s):  Over the next 30 days, patient will contact provider office for questions/concerns as evidenced notation of same in electronic health record through collaboration with PharmD and provider.    Interventions: Inter-disciplinary care team collaboration (see longitudinal plan of care) Comprehensive medication review performed; medication list updated in electronic medical record  _4 @ _5 @ _6 @  Patient Goals/Self-Care Activities Over the next 30 days, patient will:  - collaborate with provider on medication access solutions  Follow Up Plan: The care management team will reach out to the patient again over the next 30 days.

## 2020-12-25 NOTE — Patient Outreach (Signed)
Medicaid Managed Care   Nurse Care Manager Note  12/25/2020 Name:  Chris [name] MRN:  [id number] DOB:  1961-01-01  Chris [name] is an 60 y.o. year old male who is a primary patient of [name], MD.  The [facility] Managed Care Coordination team was consulted for assistance with:    CHF HTN DMII  Chris [name] was given information about Medicaid Managed Care Coordination team services today. [name] agreed to services and verbal consent obtained.  Engaged with patient by telephone for follow up visit in response to provider referral for case management and/or care coordination services.   Assessments/Interventions:  Review of past medical history, allergies, medications, health status, including review of consultants reports, laboratory and other test data, was performed as part of comprehensive evaluation and provision of chronic care management services.  SDOH (Social Determinants of Health) assessments and interventions performed:   Care Plan  No Known Allergies  Medications Reviewed Today     Reviewed by Alisa Graff, FNP (Family Nurse Practitioner) on 11/27/20 at 1230  Med List Status: <None>   Medication Order Taking? Sig Documenting Provider Last Dose Status Informant  albuterol (PROVENTIL) (2.5 MG/3ML) 0.083% nebulizer solution [id number] Yes TAKE 3 MLS BY NEBULIZATION EVERY 6 (SIX) HOURS AS NEEDED FOR SHORTNESS OF BREATH. Freeman Caldron M, PA-C Taking Active   albuterol (VENTOLIN HFA) 108 (90 Base) MCG/ACT inhaler [id number] Yes INHALE 2 PUFFS INTO THE LUNGS EVERY 6 (SIX) HOURS AS NEEDED FOR WHEEZING OR SHORTNESS OF BREATH. Freeman Caldron M, PA-C Taking Active   allopurinol (ZYLOPRIM) 100 MG tablet [id number] Yes TAKE 2 TABLETS (200 MG TOTAL) BY MOUTH DAILY. Freeman Caldron M, PA-C Taking Active   apixaban (ELIQUIS) 5 MG TABS tablet [id number] Yes TAKE 1 TABLET (5 MG TOTAL) BY MOUTH 2 (TWO) TIMES DAILY. [name], [place] Taking Active   aspirin 81  MG EC tablet [id number] Yes TAKE 1 TABLET (81 MG TOTAL) BY MOUTH DAILY. [name], [place] Taking Active   atorvastatin (LIPITOR) 40 MG tablet [id number] Yes TAKE 1 TABLET (40 MG TOTAL) BY MOUTH DAILY AT 6 PM. [name], PA-C Taking Active   budesonide-formoterol [facility]) 160-4.5 MCG/ACT inhaler [id number] Yes INHALE 2 PUFFS INTO THE LUNGS 2 (TWO) TIMES DAILY. Freeman Caldron M, PA-C Taking Active   carvedilol (COREG) 25 MG tablet [id number] Yes TAKE 1 TABLET (25 MG TOTAL) BY MOUTH 2 (TWO) TIMES DAILY. Freeman Caldron M, PA-C Taking Active   colchicine 0.6 MG tablet [id number] Yes Take 2 tabs (1.2 mg) at the onset of a gout flare, may repeat 1 tab (0.6 mg) after 2 hours if symptoms persist. [name], PA-C Taking Active   dapagliflozin propanediol (FARXIGA) 10 MG TABS tablet [id number] Yes Take 10 mg by mouth 1 (one) time each day Freeman Caldron M, PA-C Taking Active   diclofenac Sodium (VOLTAREN) 1 % GEL [id number] No APPLY 2 G TOPICALLY 4 (FOUR) TIMES DAILY. USE ON ELBOW PAIN  Patient not taking: No sig reported   [name], MD Not Taking Active   fluticasone (FLONASE) 50 MCG/ACT nasal spray [id number] Yes Place 1 spray into both nostrils daily as needed for allergies or rhinitis. Freeman Caldron M, PA-C Taking Active   furosemide (LASIX) 40 MG tablet [id number] Yes TAKE 1 TABLET (40 MG TOTAL) BY MOUTH DAILY. Freeman Caldron M, PA-C Taking Active   glipiZIDE (GLUCOTROL) 5 MG tablet [id number] Yes Take 0.5 tablets (2.5 mg total) by mouth 2 (two) times daily  before a meal. [name], PA-C Taking Active   isosorbide-hydrALAZINE (BIDIL) 20-37.5 MG tablet [id number] No TAKE 1 TABLET BY MOUTH 3 (THREE) TIMES DAILY.  Patient not taking: No sig reported   [name], MD Not Taking Active   losartan (COZAAR) 100 MG tablet [id number] Yes Take 1 tablet (100 mg total) by mouth 1 (one) time each day [name] Taking Active   Discontinued 07/11/20 0943 (Discontinued  by provider)   montelukast (SINGULAIR) 10 MG tablet [id number] Yes TAKE 1 TABLET (10 MG TOTAL) BY MOUTH AT BEDTIME. Freeman Caldron M, PA-C Taking Active   sacubitril-valsartan (ENTRESTO) 24-26 MG [id number] No Take 1 tablet by mouth 2 (two) times daily.  Patient not taking: No sig reported   [provider] Not Taking Active   sodium polystyrene (KAYEXALATE) 15 GM/60ML suspension [id number] No TAKE 60 MLS (15 G TOTAL) BY MOUTH ONCE FOR 1 DOSE.  Patient not taking: No sig reported   [name], MD Not Taking Active   sodium zirconium cyclosilicate (LOKELMA) 5 g packet [id number] No TAKE 5 G BY MOUTH EVERY OTHER DAY FOR 2 DOSES.  Patient not taking: No sig reported   [name], MD Not Taking Active   triamcinolone cream (KENALOG) 0.1 % [id number] Yes Apply 1 application topically 2 (two) times daily as needed (skin irritation on face). [name], [place] Taking Active             Patient Active Problem List   Diagnosis Date Noted   Left-sided weakness 10/28/2020   COPD exacerbation (HCC)    Typical atrial flutter (HCC)    Acute respiratory failure with hypoxia ([place]) 07/05/2020   CHF (congestive heart failure) ([place]) 07/04/2020   Acute exacerbation of CHF (congestive heart failure) ([place]) 06/16/2020   COPD with acute exacerbation ([place]) 06/16/2020   Influenza vaccine refused 05/06/2020   Acute on chronic combined systolic (congestive) and diastolic (congestive) heart failure ([place]) 05/05/2020   Acute decompensated heart failure ([place]) 05/04/2020   Illiteracy 05/04/2020   Chronic respiratory failure with hypoxia, on home oxygen therapy ([place]) 12/28/2019   Type 2 diabetes mellitus with stage 3 chronic kidney disease ([place]) 12/25/2019   Acute and chronic respiratory failure (acute-on-chronic) ([place]) 12/25/2019   Acute on chronic combined systolic and diastolic CHF (congestive heart failure) ([place]) 10/26/2019   Elevated troponin I level 10/26/2019   Acute on chronic  diastolic (congestive) heart failure ([place]) 10/26/2019   History of gout 02/01/2019   Seasonal allergic rhinitis due to pollen 02/01/2019   Tobacco dependence 11/30/2018   Microscopic hematuria 11/30/2018   Depression 11/30/2018   Difficulty controlling anger 11/30/2018   CAP (community acquired pneumonia) 08/11/2018   COPD (chronic obstructive pulmonary disease) ([place])    CKD (chronic kidney disease) stage 3, GFR 30-59 ml/min ([place]) 08/10/2018   Recurrent epistaxis 04/21/2018   Mixed hyperlipidemia 07/28/2017   Essential hypertension 70/17/7939   Chronic systolic heart failure ([place]) 10/25/2014   Cocaine abuse ([place]) 02/20/2013   Cannabis abuse 02/20/2013   Back pain, chronic 02/20/2013    Conditions to be addressed/monitored per PCP order:  CHF, HTN, and DMII  Care Plan : Heart Failure (Adult)  Updates made by [name], RN since 12/25/2020 12:00 AM     Problem: Symptom Exacerbation (Heart Failure)      Long-Range Goal: Symptom Exacerbation Prevented or Minimized   Start Date: 07/02/2020  Expected End Date: 01/30/2021  Recent Progress: On track  Priority: High  Note:  Current Barriers:  Chronic Disease Management, support and educational needs related to management of multiple chronic diagnosis. Mr. Bruun is managing CHF, DMII, stage III CKD and chronic respiratory failure. He missed his last appointment with his PCP and cardiologist. He reports eating right, checking his blood sugar daily and taking all of his medications. He recently moved and is living with his brother, desires his own place. Mr. Hocker had a recent visit to the ED for pain and weakness. He reports feeling better. He is unable to focus on his health due to his unhappiness with his current living situation. He is living with his brother, but he only wants a place of his own. Mr. Merriweather reports now living on his own in his uncles house. He reports that the house needs work, but he will save the money and do  the work. He reports feeling less stress now. Now he can focus on his health. He does need food benefits, states that he was denied due to "charges". He does not have transportation, but he is able to walk to a grocery store, [employer] and [employer]. [place] is upset today. Reports that a family member took his bank card and he needs assistance paying his utility bill. He missed calls yesterday from [place]. Dr. Wynetta Emery ordered a new nebulizer and glucometer on 7/1, Mr. Nijjar has not received either at this time.  Nurse Case Manager Clinical Goal(s):  patient will work with Jackelyn Poling [facility] to address needs related to housing-Mr. Brickell reports no assistance available patient will meet with RN Care Manager to address needs related to managing CHF and DMII patient will demonstrate improved health management independence as evidenced bytaking prescribed medications, reporting any changes in symptoms to PCP such as swelling, changes in breathing, new cough Patient will work with [facility] pharmacist, Ovid Curd for medication management Patient will work with Jerene Pitch, LCSW for mental health-[facility] Patient will work with Ubaldo Glassing for housing insecurity-Patient currently has is own place Interventions:  Inter-disciplinary care team collaboration (see longitudinal plan of care) Evaluation of current treatment plan related to CHF and DMII and patient's adherence to plan as established by provider. Advised patient to call and schedule an appointment with Cardiology-Dr. Clayborn Bigness [phone] Advised patient to follow up with all scheduled appointments-HF clinic 7/26-Mr. Mamaril reports his brother will take him Collaborated with [name] and Care Guide for resources Discussed plans with patient for ongoing care management follow up and provided patient with direct contact information for care management team Provided therapeutic listening Patient Goals/Self-Care  Activities - call and make an appointment with Cardiology-Dr. Clayborn Bigness [phone] - continue to work on smoking cessation - eat more whole grains, fruits and vegetables, lean meats and healthy fats -Call to fill prescriptions one week before I run out of medication -Take all medications as prescribed - call office if I gain more than 2 pounds in one day or 5 pounds in one week - use salt in moderation - watch for swelling in feet, ankles and legs every day  - eat more whole grains, fruits and vegetables, lean meats and healthy fats - know when to call the doctor - track symptoms and what helps feel better or worse - dress right for the weather, hot or cold - eat a heart healthy diet, low fat, no salt - contact provider with any concerns or questions - attend all scheduled appointments Follow Up Plan: Telephone follow up appointment with Managed Medicaid care management team member scheduled for:01/09/21 @  12:30pm      Follow Up:  Patient agrees to Care Plan and Follow-up.  Plan: The Managed Medicaid care management team will reach out to the patient again over the next 14 days.  Date/time of next scheduled RN care management/care coordination outreach:  01/09/21 @ 12:30pm  [name] RN, [place] RN Care Coordinator

## 2020-12-26 ENCOUNTER — Other Ambulatory Visit: Payer: Medicaid Other

## 2020-12-26 ENCOUNTER — Telehealth: Payer: Self-pay | Admitting: *Deleted

## 2020-12-26 NOTE — Patient Instructions (Signed)
Visit Information  Chris [name] was given information about Medicaid Managed Care team care coordination services as a part of their Healthy [facility] Medicaid benefit. [name] verbally consented to engagement with the [facility] Managed Care team.   For questions related to your Healthy [facility] health plan, please call: [phone] or visit the homepage here: [email or web address]  If you would like to schedule transportation through your Healthy [facility] plan, please call the following number at least 2 days in advance of your appointment: [phone]  Call the [facility] at [phone], at any time, 24 hours a day, 7 days a week. If you are in danger or need immediate medical attention call 911.  If you would like help to quit smoking, call 1-800-QUIT-NOW 484-781-8766) OR Espaol: 1-855-Djelo-Ya (6-606-004-5997) o para ms informacin haga clic aqu or Text READY to 200-400 to register via text  [name] - following are the goals we discussed in your visit today:   Goals Addressed   None       Social Worker will follow up in 30 days.   [name], [name], [place] Managed Medicaid Team  [phone]   Following is a copy of your plan of care:

## 2020-12-26 NOTE — Patient Outreach (Signed)
Medicaid Managed Care Social Work Note  12/26/2020 Name:  Chris [name] MRN:  [id number] DOB:  09-15-1960  Chris [name] is an 60 y.o. year old male who is a primary patient of [name], MD.  The Medicaid Managed Care Coordination team was consulted for assistance with:  Community Resources   Chris [name] was given information about Medicaid Managed CareCoordination services today. Chris [name] agreed to services and verbal consent obtained.  Engaged with patient  for by telephone forfollow up visit in response to referral for case management and/or care coordination services.   Assessments/Interventions:  Review of past medical history, allergies, medications, health status, including review of consultants reports, laboratory and other test data, was performed as part of comprehensive evaluation and provision of chronic care management services.  SDOH: (Social Determinant of Health) assessments and interventions performed:  [name] provided patient with resources for his utility bill.   Advanced Directives Status:  Not addressed in this encounter.  Care Plan                 No Known Allergies  Medications Reviewed Today     Reviewed by Alisa Graff, FNP (Family Nurse Practitioner) on 11/27/20 at 1230  Med List Status: <None>   Medication Order Taking? Sig Documenting Provider Last Dose Status Informant  albuterol (PROVENTIL) (2.5 MG/3ML) 0.083% nebulizer solution [id number] Yes TAKE 3 MLS BY NEBULIZATION EVERY 6 (SIX) HOURS AS NEEDED FOR SHORTNESS OF BREATH. Freeman Caldron M, PA-C Taking Active   albuterol (VENTOLIN HFA) 108 (90 Base) MCG/ACT inhaler [id number] Yes INHALE 2 PUFFS INTO THE LUNGS EVERY 6 (SIX) HOURS AS NEEDED FOR WHEEZING OR SHORTNESS OF BREATH. Freeman Caldron M, PA-C Taking Active   allopurinol (ZYLOPRIM) 100 MG tablet [id number] Yes TAKE 2 TABLETS (200 MG TOTAL) BY MOUTH DAILY. Freeman Caldron M, PA-C Taking Active   apixaban (ELIQUIS) 5 MG TABS tablet  [id number] Yes TAKE 1 TABLET (5 MG TOTAL) BY MOUTH 2 (TWO) TIMES DAILY. [name], [place] Taking Active   aspirin 81 MG EC tablet [id number] Yes TAKE 1 TABLET (81 MG TOTAL) BY MOUTH DAILY. [name], [place] Taking Active   atorvastatin (LIPITOR) 40 MG tablet [id number] Yes TAKE 1 TABLET (40 MG TOTAL) BY MOUTH DAILY AT 6 PM. [name], PA-C Taking Active   budesonide-formoterol [facility]) 160-4.5 MCG/ACT inhaler [id number] Yes INHALE 2 PUFFS INTO THE LUNGS 2 (TWO) TIMES DAILY. Freeman Caldron M, PA-C Taking Active   carvedilol (COREG) 25 MG tablet [id number] Yes TAKE 1 TABLET (25 MG TOTAL) BY MOUTH 2 (TWO) TIMES DAILY. Freeman Caldron M, PA-C Taking Active   colchicine 0.6 MG tablet [id number] Yes Take 2 tabs (1.2 mg) at the onset of a gout flare, may repeat 1 tab (0.6 mg) after 2 hours if symptoms persist. [name], PA-C Taking Active   dapagliflozin propanediol (FARXIGA) 10 MG TABS tablet [id number] Yes Take 10 mg by mouth 1 (one) time each day Freeman Caldron M, PA-C Taking Active   diclofenac Sodium (VOLTAREN) 1 % GEL [id number] No APPLY 2 G TOPICALLY 4 (FOUR) TIMES DAILY. USE ON ELBOW PAIN  Patient not taking: No sig reported   [name], MD Not Taking Active   fluticasone (FLONASE) 50 MCG/ACT nasal spray [id number] Yes Place 1 spray into both nostrils daily as needed for allergies or rhinitis. [name], [place] Taking Active   furosemide (LASIX) 40 MG tablet [id number] Yes TAKE 1 TABLET (40 MG TOTAL) BY MOUTH  DAILY. [name], PA-C Taking Active   glipiZIDE (GLUCOTROL) 5 MG tablet [id number] Yes Take 0.5 tablets (2.5 mg total) by mouth 2 (two) times daily before a meal. McClung, Angela M, PA-C Taking Active   isosorbide-hydrALAZINE (BIDIL) 20-37.5 MG tablet [id number] No TAKE 1 TABLET BY MOUTH 3 (THREE) TIMES DAILY.  Patient not taking: No sig reported   [name], MD Not Taking Active   losartan (COZAAR) 100 MG tablet [id number] Yes Take 1 tablet (100  mg total) by mouth 1 (one) time each day [name] Taking Active   Discontinued 07/11/20 0943 (Discontinued by provider)   montelukast (SINGULAIR) 10 MG tablet [id number] Yes TAKE 1 TABLET (10 MG TOTAL) BY MOUTH AT BEDTIME. Freeman Caldron M, PA-C Taking Active   sacubitril-valsartan (ENTRESTO) 24-26 MG [id number] No Take 1 tablet by mouth 2 (two) times daily.  Patient not taking: No sig reported   [provider] Not Taking Active   sodium polystyrene (KAYEXALATE) 15 GM/60ML suspension [id number] No TAKE 60 MLS (15 G TOTAL) BY MOUTH ONCE FOR 1 DOSE.  Patient not taking: No sig reported   [name], MD Not Taking Active   sodium zirconium cyclosilicate (LOKELMA) 5 g packet [id number] No TAKE 5 G BY MOUTH EVERY OTHER DAY FOR 2 DOSES.  Patient not taking: No sig reported   [name], MD Not Taking Active   triamcinolone cream (KENALOG) 0.1 % [id number] Yes Apply 1 application topically 2 (two) times daily as needed (skin irritation on face). [name], [place] Taking Active             Patient Active Problem List   Diagnosis Date Noted   Left-sided weakness 10/28/2020   COPD exacerbation (HCC)    Typical atrial flutter (HCC)    Acute respiratory failure with hypoxia ([place]) 07/05/2020   CHF (congestive heart failure) ([place]) 07/04/2020   Acute exacerbation of CHF (congestive heart failure) ([place]) 06/16/2020   COPD with acute exacerbation ([place]) 06/16/2020   Influenza vaccine refused 05/06/2020   Acute on chronic combined systolic (congestive) and diastolic (congestive) heart failure ([employer]) 05/05/2020   Acute decompensated heart failure ([place]) 05/04/2020   Illiteracy 05/04/2020   Chronic respiratory failure with hypoxia, on home oxygen therapy ([place]) 12/28/2019   Type 2 diabetes mellitus with stage 3 chronic kidney disease ([place]) 12/25/2019   Acute and chronic respiratory failure (acute-on-chronic) ([place]) 12/25/2019   Acute on chronic combined systolic and  diastolic CHF (congestive heart failure) ([place]) 10/26/2019   Elevated troponin I level 10/26/2019   Acute on chronic diastolic (congestive) heart failure ([place]) 10/26/2019   History of gout 02/01/2019   Seasonal allergic rhinitis due to pollen 02/01/2019   Tobacco dependence 11/30/2018   Microscopic hematuria 11/30/2018   Depression 11/30/2018   Difficulty controlling anger 11/30/2018   CAP (community acquired pneumonia) 08/11/2018   COPD (chronic obstructive pulmonary disease) ([place])    CKD (chronic kidney disease) stage 3, GFR 30-59 ml/min ([place]) 08/10/2018   Recurrent epistaxis 04/21/2018   Mixed hyperlipidemia 07/28/2017   Essential hypertension 75/64/3329   Chronic systolic heart failure ([place]) 10/25/2014   Cocaine abuse ([place]) 02/20/2013   Cannabis abuse 02/20/2013   Back pain, chronic 02/20/2013    Conditions to be addressed/monitored per PCP order:   utilities  Care Plan : General Social Work (Adult)  Updates made by Ethelda Chick since 12/26/2020 12:00 AM     Problem: Homelessness Resolved 11/25/2020  Onset Date: 07/09/2020  Note:  Chris [name] is a 60 y.o. year old male who sees [name], MD for primary care. The  Medicaid Managed Care team was consulted for assistance with Housing barriers. Chris [name] was given information about Care Management services, agreed to services, and verbal consent for services was obtained.  Interventions:  Patient interviewed and appropriate assessments performed Collaborated with clinical team regarding patient needs  SDOH (Social Determinants of Health) assessments performed: Yes     Provided patient with information about the [facility]. [name] contacted [facility] to complete a referral for patient to get into the homeless motel. [name] left a voicemail. [name] informed patient that the process can take a long time. Patient states he does not know why no one wants to help him. [name] informed patient he may be able to go to a  shelter in [facility], patient states he does not feel safe in [place].  [name] contacted [employer] in [place] [phone] and left a voicemail for the intake coordinator. [name] contacted [place] to speak with a Education officer, museum but kept getting transferred.  Patient states he does not have any money to get a room and become frustrated again stating that we ""do not want to help him. [name] informed patient that she will wait for the [facility] and/or the [facility] to contact her back. [name] informed patient she could provide him with the information for [facility] but patient did not want to call. Update 07/22/20: Patient stated he is doing okay since his surgery but still wants somewhere else to live. Patient stated he knows of a boarding house on [place] and would like SW to help him get in it. SW researched and could not find a boarding house on [place], Patient stated he would get the name of it and contact [name] with the information. Update 10/14/20: [name] contacted patient to complete a follow up. Patient states he is currently living with his brother but is ready to be on his own. He states he sister has found a house for him to stay but it needs a lot of work. Patient is asking for assistance with programs that help disabled with fixing up homes. Patient states the home is in [place] and needs windows and flooring done. [name] will research programs that assists with home repairs/modeling. Patient stated he is going to the beach May 6-18. [name] will follow up with patient on May 23 at 3pm. Update 5/26: [name] received a telephone call from patient. Patient states that he is ready to move. [name] provided patient with information for programs that may be able to help with renovations to the home in [place]. [name] informed patient that since his uncle owns the home, his uncle will have to call. [employer] [phone] and the [facility] [phone]. Update:  11/25/20: [name] received a telephone call back from patient. He states he has moved but needs assistance with getting the floor fixed, a new stove , refrigerator and furniture. [name] informed patient she would see if she could locate some resources that will help with the furniture. [name] already provided resources for getting the floor fixed, patient states he has not contacted those resources yet. 12/26/20: [name] received a note that patient is in need of assistance with his utility bill. Patient states he does not have a cut off notice. Resources provide are DSS [phone], [employer] [phone], and [employer] [phone].  Plan:  Over the next 30-60 days, patient will work  with [name] to address needs related to Housing barriers Education officer, museum will continue to follow up with patient.   Chris [name], [name], [place]  High Risk Managed Medicaid Team           Follow up:  Patient agrees to Care Plan and Follow-up.  Plan: The Managed Medicaid care management team will reach out to the patient again over the next 30 days.    Chris [name], [name], [place] Managed Medicaid Team  [phone]

## 2020-12-29 ENCOUNTER — Other Ambulatory Visit: Payer: Self-pay | Admitting: Internal Medicine

## 2020-12-29 DIAGNOSIS — J301 Allergic rhinitis due to pollen: Secondary | ICD-10-CM

## 2020-12-29 DIAGNOSIS — J439 Emphysema, unspecified: Secondary | ICD-10-CM

## 2020-12-29 MED ORDER — MONTELUKAST SODIUM 10 MG PO TABS
ORAL_TABLET | Freq: Every day | ORAL | 2 refills | Status: DC
  Filled 2021-02-12: qty 30, 30d supply, fill #0

## 2020-12-29 MED ORDER — FLUTICASONE PROPIONATE 50 MCG/ACT NA SUSP: 1.0000 | Freq: Every day | NASAL | 2 refills | Status: DC | PRN

## 2020-12-29 NOTE — Telephone Encounter (Signed)
Medication: Rx #: [id number] fluticasone (FLONASE) 50 MCG/ACT nasal spray [id number] , Rx #: [id number] montelukast (SINGULAIR) 10 MG tablet [[id number]]     Has the patient contacted their pharmacy? YES  (Agent: If no, request that the patient contact the pharmacy for the refill.) (Agent: If yes, when and what did the pharmacy advise?)  Preferred Pharmacy (with phone number or street name): [facility] - [place], [place] - [address] [place] [address] Phone: [phone] Fax: [phone] Hours: Not open 24 hours    Agent: Please be advised that RX refills may take up to 3 business days. We ask that you follow-up with your pharmacy.

## 2020-12-30 ENCOUNTER — Telehealth: Payer: Self-pay | Admitting: *Deleted

## 2020-12-30 NOTE — Telephone Encounter (Signed)
   Telephone encounter was:  Successful.  12/30/2020 Name: CLARKSON ROSSELLI MRN: [id number] DOB: 1961/03/12  DOIL KAMARA is a 60 y.o. year old male who is a primary care patient of [name], MD . The community resource team was consulted for assistance with [place] guide performed the following interventions: Patient provided with information about care guide support team and interviewed to confirm resource needs.Talked with member thisam told hom about the MOms meal process and thast the food would be ordered today and would take t 2 to 3 days to arrive via shipping  Follow Up Plan:  No further follow up planned at this time. The patient has been provided with needed resources.  [place], Care Management  [phone] [address] , [place] [address] Email : [email or web address] [email or web address]

## 2021-01-06 ENCOUNTER — Ambulatory Visit: Payer: Medicaid Other | Admitting: Family

## 2021-01-07 ENCOUNTER — Telehealth: Payer: Self-pay | Admitting: *Deleted

## 2021-01-07 ENCOUNTER — Other Ambulatory Visit: Payer: Self-pay | Admitting: Licensed Clinical Social Worker

## 2021-01-07 NOTE — Patient Outreach (Signed)
Medicaid Managed Care Social Work Note  01/07/2021 Name:  Chris [name] MRN:  [id number] DOB:  31-Aug-1960  Chris [name] is an 60 y.o. year old male who is a primary patient of [name], MD.  The Medicaid Managed Care Coordination team was consulted for assistance with:  Mental Health Counseling and Resources  Chris [name] was given information about Medicaid Managed CareCoordination services today. Chris [name] agreed to services and verbal consent obtained.  Engaged with patient  for by telephone forfollow up visit in response to referral for case management and/or care coordination services.   Assessments/Interventions:  Review of past medical history, allergies, medications, health status, including review of consultants reports, laboratory and other test data, was performed as part of comprehensive evaluation and provision of chronic care management services.  SDOH: (Social Determinant of Health) assessments and interventions performed: SDOH Interventions    Flowsheet Row Most Recent Value  SDOH Interventions   Depression Interventions/Treatment  Patient refuses Treatment       Advanced Directives Status:  Not addressed in this encounter.  Care Plan                 No Known Allergies  Medications Reviewed Today     Reviewed by Chris Freeze, FNP (Family Nurse Practitioner) on 11/27/20 at 1230  Med List Status: <None>   Medication Order Taking? Sig Documenting Provider Last Dose Status Informant  albuterol (PROVENTIL) (2.5 MG/3ML) 0.083% nebulizer solution [id number] Yes TAKE 3 MLS BY NEBULIZATION EVERY 6 (SIX) HOURS AS NEEDED FOR SHORTNESS OF BREATH. Chris [name] [name], [name] Taking Active   albuterol (VENTOLIN HFA) 108 (90 Base) MCG/ACT inhaler [id number] Yes INHALE 2 PUFFS INTO THE LUNGS EVERY 6 (SIX) HOURS AS NEEDED FOR WHEEZING OR SHORTNESS OF BREATH. Chris [name] [name], [name] Taking Active   allopurinol (ZYLOPRIM) 100 MG tablet [id number] Yes TAKE 2 TABLETS  (200 MG TOTAL) BY MOUTH DAILY. Chris [name] [name], [name] Taking Active   apixaban (ELIQUIS) 5 MG TABS tablet [id number] Yes TAKE 1 TABLET (5 MG TOTAL) BY MOUTH 2 (TWO) TIMES DAILY. Chris [name] Taking Active   aspirin 81 MG EC tablet [id number] Yes TAKE 1 TABLET (81 MG TOTAL) BY MOUTH DAILY. Chris [name] Taking Active   atorvastatin (LIPITOR) 40 MG tablet [id number] Yes TAKE 1 TABLET (40 MG TOTAL) BY MOUTH DAILY AT 6 PM. Chris Simmonds, [name] Taking Active   budesonide-formoterol [facility]) 160-4.5 MCG/ACT inhaler [id number] Yes INHALE 2 PUFFS INTO THE LUNGS 2 (TWO) TIMES DAILY. Chris [name] [name], [name] Taking Active   carvedilol (COREG) 25 MG tablet [id number] Yes TAKE 1 TABLET (25 MG TOTAL) BY MOUTH 2 (TWO) TIMES DAILY. Chris [name] [name], [name] Taking Active   colchicine 0.6 MG tablet [id number] Yes Take 2 tabs (1.2 mg) at the onset of a gout flare, may repeat 1 tab (0.6 mg) after 2 hours if symptoms persist. Chris Simmonds, [name] Taking Active   dapagliflozin propanediol (FARXIGA) 10 MG TABS tablet [id number] Yes Take 10 mg by mouth 1 (one) time each day Chris [name] [name], [name] Taking Active   diclofenac Sodium (VOLTAREN) 1 % GEL [id number] No APPLY 2 G TOPICALLY 4 (FOUR) TIMES DAILY. USE ON ELBOW PAIN  Patient not taking: No sig reported   Chris Barrio, MD Not Taking Active   fluticasone (FLONASE) 50 MCG/ACT nasal spray [id number] Yes Place 1 spray into both nostrils daily as needed for allergies or rhinitis. Chris Simmonds, [name] Taking Active  furosemide (LASIX) 40 MG tablet [id number] Yes TAKE 1 TABLET (40 MG TOTAL) BY MOUTH DAILY. Chris [name] [name], [name] Taking Active   glipiZIDE (GLUCOTROL) 5 MG tablet [id number] Yes Take 0.5 tablets (2.5 mg total) by mouth 2 (two) times daily before a meal. Chris [name] Taking Active   isosorbide-hydrALAZINE (BIDIL) 20-37.5 MG tablet [id number] No TAKE 1 TABLET BY MOUTH 3 (THREE) TIMES DAILY.  Patient not taking: No sig reported    [name], MD Not Taking Active   losartan (COZAAR) 100 MG tablet [id number] Yes Take 1 tablet (100 mg total) by mouth 1 (one) time each day Chris [name] Taking Active   Discontinued 07/11/20 0943 (Discontinued by provider)   montelukast (SINGULAIR) 10 MG tablet [id number] Yes TAKE 1 TABLET (10 MG TOTAL) BY MOUTH AT BEDTIME. Chris [name] [name], [name] Taking Active   sacubitril-valsartan (ENTRESTO) 24-26 MG [id number] No Take 1 tablet by mouth 2 (two) times daily.  Patient not taking: No sig reported   [provider] Not Taking Active   sodium polystyrene (KAYEXALATE) 15 GM/60ML suspension [id number] No TAKE 60 MLS (15 G TOTAL) BY MOUTH ONCE FOR 1 DOSE.  Patient not taking: No sig reported   [name], MD Not Taking Active   sodium zirconium cyclosilicate (LOKELMA) 5 g packet [id number] No TAKE 5 G BY MOUTH EVERY OTHER DAY FOR 2 DOSES.  Patient not taking: No sig reported   Chris Coy, MD Not Taking Active   triamcinolone cream (KENALOG) 0.1 % [id number] Yes Apply 1 application topically 2 (two) times daily as needed (skin irritation on face). Chris [name] Taking Active             Patient Active Problem List   Diagnosis Date Noted   Left-sided weakness 10/28/2020   COPD exacerbation (HCC)    Typical atrial flutter (HCC)    Acute respiratory failure with hypoxia (HCC) 07/05/2020   CHF (congestive heart failure) (HCC) 07/04/2020   Acute exacerbation of CHF (congestive heart failure) (HCC) 06/16/2020   COPD with acute exacerbation (HCC) 06/16/2020   Influenza vaccine refused 05/06/2020   Acute on chronic combined systolic (congestive) and diastolic (congestive) heart failure (HCC) 05/05/2020   Acute decompensated heart failure (HCC) 05/04/2020   Illiteracy 05/04/2020   Chronic respiratory failure with hypoxia, on home oxygen therapy (HCC) 12/28/2019   Type 2 diabetes mellitus with stage 3 chronic kidney disease (HCC) 12/25/2019   Acute and  chronic respiratory failure (acute-on-chronic) (HCC) 12/25/2019   Acute on chronic combined systolic and diastolic CHF (congestive heart failure) (HCC) 10/26/2019   Elevated troponin I level 10/26/2019   Acute on chronic diastolic (congestive) heart failure (HCC) 10/26/2019   History of gout 02/01/2019   Seasonal allergic rhinitis due to pollen 02/01/2019   Tobacco dependence 11/30/2018   Microscopic hematuria 11/30/2018   Depression 11/30/2018   Difficulty controlling anger 11/30/2018   CAP (community acquired pneumonia) 08/11/2018   COPD (chronic obstructive pulmonary disease) (HCC)    CKD (chronic kidney disease) stage 3, GFR 30-59 ml/min (HCC) 08/10/2018   Recurrent epistaxis 04/21/2018   Mixed hyperlipidemia 07/28/2017   Essential hypertension 07/28/2017   Chronic systolic heart failure (HCC) 10/25/2014   Cocaine abuse (HCC) 02/20/2013   Cannabis abuse 02/20/2013   Back pain, chronic 02/20/2013    Conditions to be addressed/monitored per PCP order:  Anxiety and Depression  Care Plan : General Social Work (Adult)  Updates made by Gustavus Bryant, LCSW since 01/07/2021 12:00  AM     Problem: Depression Identification (Depression)      Long-Range Goal: To increase my self-care and coping skills   Start Date: 10/07/2020  Priority: High  Note:   Timeframe:  Long-Range Goal Priority:  High Start Date:    10/07/20                        Expected End Date:ongoing    Follow Up Date- 01/28/21   - begin personal counseling - call and visit an old friend - check out volunteer opportunities - join a support group - laugh; watch a funny movie or comedian - learn and use visualization or guided imagery - perform a random act of kindness - practice relaxation or meditation daily - start or continue a personal journal - talk about feelings with a friend, family or spiritual advisor - practice positive thinking and self-talk    Why is this important?   When you are stressed, down  or upset, your body reacts too.  For example, your blood pressure may get higher; you may have a headache or stomachache.  When your emotions get the best of you, your body's ability to fight off cold and flu gets weak.  These steps will help you manage your emotions.   Current Barriers:  Chronic Mental Health needs related to depression and need for housing Limited social support, Housing barriers, and Mental Health Concerns  Social Isolation ADL IADL limitations Suicidal Ideation/Homicidal Ideation: No  Clinical Social Work Goal(s):  Over the next 120 days, patient will work with SW monthly by telephone or in person to reduce or manage symptoms related to depression patient will work with [name] to address needs related to finding stable housing but patient was made aware that housing resources are very limited at this time  Interventions: Patient interviewed and appropriate assessments performed: brief mental health assessment PHQ 2 PHQ 9 SDOH Interventions    Flowsheet Row Most Recent Value  SDOH Interventions   SDOH Interventions for the Following Domains Housing  Housing Interventions Other (Comment)  [Referral for housing support]  Depression Interventions/Treatment  Patient refuses Treatment      Patient interviewed and appropriate assessments performed Discussed plans with patient for ongoing care management follow up and provided patient with direct contact information for care management team Assisted patient/caregiver with obtaining information about health plan benefits Encouraged patient to consider a mental health provider for long term follow up and therapy/counseling but patient declined. Patient reports that does not need a psychiatry referral at this time. He reports that his main source of depression is from not being able to find a place on his own. He is currently stable and residing with his brother and his brother's family in their home. Patient reports that his  brother leaves for work at 3 pm during the week and gets back home around 1 am. [employer] number provided to patient. Patient reports that his sister has found him a house to relocate in. This house is inherited from the family and will need some additional work done. Patient reports that this house has mold in it but he rather reside there instead of living with his brother. Patient is currently stable and reports no urgent concerns at this time. [facility] LCSW completed care coordination with Care Guide on 01/07/21 as patient never received food delivery that was set up.  Patient reports recent agitation due to his inability to secure housing on this own.  [facility] LCSW provided emotional support and brief housing resource education. Patient is currently not on any public housing wait list.   Solution-Focused Strategies, Mindfulness or Relaxation Training, Active listening / Reflection utilized , Emotional Supportive Provided, and Verbalization of feelings encouraged  Patient declines any current substance use. He reports that his last use of cocaine was over 6 months ago. Patient reports that he has developed social anxiety. He admits that he gets irritable at times and will snap at his loved ones and then will experience guilt afterwards. Educated patient on coping methods to implement into his daily life to combat anxiety symptoms and stress. Patient denied any current suicidal or homicidal ideations. Encouraged patient to implement deep breathing and grounding exercises into his daily routine due to ongoing anxiety and SOB.  Update 01/07/21-Patient is not agreeable to mental health support or any additional referrals. [facility] LCSW placed referral for counseling and medication management through the [employer] in the past but patient declined their calls and eventually refused services.  Patient reports implementing appropriate self-care habits into his daily routine such as: drinking water,  staying active around the house, taking his medications as prescribed, combating negative thoughts or emotions and staying connected with his support network. Positive reinforcement provided.   Patient Self Care Activities:  Ability for insight Independent living Strong family or social support  Patient Coping Strengths:  Self Advocate Able to Communicate Effectively  Patient Self Care Deficits:  Lacks social connections  Depression screen [facility] 2/9 10/07/2020 11/01/2019 09/11/2019 07/09/2019 04/04/2019  Decreased Interest 2 2 0 1 3  Down, Depressed, Hopeless 2 3 1 1 3   PHQ - 2 Score 4 5 1 2 6   Altered sleeping 1 3 3  0 3  Tired, decreased energy 3 2 0 1 3  Change in appetite 0 - 0 1 0  Feeling bad or failure about yourself  2 2 0 0 3  Trouble concentrating 2 2 0 0 3  Moving slowly or fidgety/restless 0 - 0 0 3  Suicidal thoughts 0 0 0 0 0  PHQ-9 Score 12 14 4 4 21   Difficult doing work/chores Somewhat difficult - - - Extremely dIfficult      Follow up:  Patient agrees to Care Plan and Follow-up.  Plan: The Managed Medicaid care management team will reach out to the patient again over the next 30 days.  Date of next scheduled Social Work care management/care coordination outreach:  01/28/21  Dickie La, [name], MSW, LCSW Managed Medicaid LCSW [facility]  [facility] [email or web address] Phone: [phone]

## 2021-01-07 NOTE — Telephone Encounter (Signed)
   Telephone encounter was:  Successful.  01/07/2021 Name: Chris [name] MRN: [id number] DOB: [birth year]  Chris [name] is a 60 y.o. year old male who is a primary care patient of [name], MD . The community resource team was consulted for assistance with Food InsecurityPatient called in asking about food , cannot get MOMs meals or meals on wheels provided food banks but patient has no transportaion   Care guide performed the following interventions: Patient provided with information about care guide support team and interviewed to confirm resource needs.  Follow Up Plan:  No further follow up planned at this time. The patient has been provided with needed resources.  [place], Care Management  [phone] [address] , [place] [address] Email : Ashby [email or web address]

## 2021-01-07 NOTE — Telephone Encounter (Signed)
   Telephone encounter was:  Successful.  01/07/2021 Name: [name] MRN: [id number] DOB: 1960/11/02  Chris [name] is a 60 y.o. year old male who is a primary care patient of [name], MD . The community resource team was consulted for assistance with [place] guide performed the following interventions: Patient provided with information about care guide support team and interviewed to confirm resource needs.  Follow Up Plan:  No further follow up planned at this time. The patient has been provided with needed resources.  [place], Care Management  [phone] [address] , [place] [address] Email : Ashby Dawes. [email or web address]-moran [email or web address]

## 2021-01-07 NOTE — Patient Instructions (Signed)
Visit Information  Mr. Gregori was given information about Medicaid Managed Care team care coordination services as a part of their Healthy [facility] Medicaid benefit. SAAHIR GIDCUMB verbally consented to engagement with the [facility] Managed Care team.   If you are experiencing a medical emergency, please call 911 or report to your local emergency department or urgent care.   If you have a non-emergency medical problem during routine business hours, please contact your provider's office and ask to speak with a nurse.   For questions related to your Healthy [facility] health plan, please call: [phone] or visit the homepage here: [email or web address]  If you would like to schedule transportation through your Healthy [facility] plan, please call the following number at least 2 days in advance of your appointment: [phone]  Call the [place] at [phone], at any time, 24 hours a day, 7 days a week. If you are in danger or need immediate medical attention call 911.  If you would like help to quit smoking, call 1-800-QUIT-NOW (901) 550-1066) OR Espaol: 1-855-Djelo-Ya HD:1601594) o para ms informacin haga clic aqu or Text READY to 200-400 to register via text  Chris [name] - following are the goals we discussed in your visit today:   Goals Addressed             This Visit's Progress    Manage My Emotions and Improve My Self-Care       Timeframe:  Long-Range Goal Priority:  High Start Date:    10/07/20                        Expected End Date:  ongoing                    Follow Up Date-01/28/21   - begin personal counseling - call and visit an old friend - check out volunteer opportunities - join a support group - laugh; watch a funny movie or comedian - learn and use visualization or guided imagery - perform a random act of kindness - practice relaxation or meditation daily - start or continue a personal  journal - talk about feelings with a friend, family or spiritual advisor - practice positive thinking and self-talk    Why is this important?   When you are stressed, down or upset, your body reacts too.  For example, your blood pressure may get higher; you may have a headache or stomachache.  When your emotions get the best of you, your body's ability to fight off cold and flu gets weak.  These steps will help you manage your emotions.   Current Barriers:  Chronic Mental Health needs related to depression and need for housing Limited social support, Housing barriers, and Mental Health Concerns  Social Isolation ADL IADL limitations Suicidal Ideation/Homicidal Ideation: No  Clinical Social Work Goal(s):  Over the next 120 days, patient will work with SW monthly by telephone or in person to reduce or manage symptoms related to depression patient will work with [name] to address needs related to finding stable housing but patient was made aware that housing resources are very limited at this time  Interventions: Patient interviewed and appropriate assessments performed: brief mental health assessment PHQ 2 PHQ 9 SDOH Interventions    Flowsheet Row Most Recent Value  SDOH Interventions   SDOH Interventions for the Following Domains Housing  Housing Interventions Other (Comment)  [Referral for housing support]  Depression Interventions/Treatment  Patient refuses Treatment  Patient interviewed and appropriate assessments performed Discussed plans with patient for ongoing care management follow up and provided patient with direct contact information for care management team Assisted patient/caregiver with obtaining information about health plan benefits Encouraged patient to consider a mental health provider for long term follow up and therapy/counseling but patient declined. Patient reports that does not need a psychiatry referral at this time. He reports that his main source of  depression is from not being able to find a place on his own. He is currently stable and residing with his brother and his brother's family in their home. Patient reports that his brother leaves for work at 3 pm during the week and gets back home around 1 am. [employer] number provided to patient. Patient reports that his sister has found him a house to relocate in. This house is inherited from the family and will need some additional work done. Patient reports that this house has mold in it but he rather reside there instead of living with his brother. Patient is currently stable and reports no urgent concerns at this time. [facility] LCSW completed care coordination with [facility] [name] on 10/07/20 who will follow up with patient this week. Patient reports recent agitation due to his inability to secure housing on this own. [facility] LCSW provided emotional support and brief housing resource education. Patient is currently not on any public housing wait list.   Solution-Focused Strategies, Mindfulness or Relaxation Training, Active listening / Reflection utilized , Emotional Supportive Provided, and Verbalization of feelings encouraged  Patient decline any current substance use. He reports that his last use of cocaine was over 6 months ago. Patient reports that he has developed social anxiety. He admits that he gets irritable at times and will snap at his loved ones and then will experience guilt afterwards. Educated patient on coping methods to implement into his daily life to combat anxiety symptoms and stress. Patient denied any current suicidal or homicidal ideations. Encouraged patient to implement deep breathing and grounding exercises into his daily routine due to ongoing anxiety and SOB.  Patient Self Care Activities:  Ability for insight Independent living Strong family or social support  Patient Coping Strengths:  Self Advocate Able to Communicate Effectively  Patient Self Care Deficits:   Lacks social connections  Depression screen [facility] 2/9 10/07/2020 11/01/2019 09/11/2019 07/09/2019 04/04/2019  Decreased Interest 2 2 0 1 3  Down, Depressed, Hopeless 2 3 1 1 3   PHQ - 2 Score 4 5 1 2 6   Altered sleeping 1 3 3  0 3  Tired, decreased energy 3 2 0 1 3  Change in appetite 0 - 0 1 0  Feeling bad or failure about yourself  2 2 0 0 3  Trouble concentrating 2 2 0 0 3  Moving slowly or fidgety/restless 0 - 0 0 3  Suicidal thoughts 0 0 0 0 0  PHQ-9 Score 12 14 4 4 21   Difficult doing work/chores Somewhat difficult - - - Extremely dIfficult        Eula Fried, [name], MSW, [employer] Managed Medicaid [email or web address] Phone: [phone]

## 2021-01-09 ENCOUNTER — Other Ambulatory Visit: Payer: Self-pay | Admitting: *Deleted

## 2021-01-09 NOTE — Patient Outreach (Signed)
Care Coordination  01/09/2021  ULYESS BOICE 1961/05/18 [id number]   Medicaid Managed Care   Unsuccessful Outreach Note  01/09/2021 Name: KAMDIN KENON MRN: [id number] DOB: Nov 27, 1960  Referred by: [name], MD Reason for referral : High Risk Managed Medicaid (Unsuccessful RNCM follow up outreach)   An unsuccessful telephone outreach was attempted today. The patient was referred to the case management team for assistance with care management and care coordination.   Follow Up Plan: A HIPAA compliant phone message was left for the patient providing contact information and requesting a return call.   [name] RN, BSN [place]  [employer] manager

## 2021-01-09 NOTE — Patient Instructions (Signed)
Visit Information  [name]  - as a part of your Medicaid benefit, you are eligible for care management and care coordination services at no cost or copay. I was unable to reach you by phone today but would be happy to help you with your health related needs. Please feel free to call me @ [phone].   A member of the Managed Medicaid care management team will reach out to you again over the next 21 days.   [name] RN, BSN   [employer] manager

## 2021-01-12 ENCOUNTER — Other Ambulatory Visit: Payer: Medicaid Other

## 2021-01-12 NOTE — Patient Outreach (Signed)
Due for meds on 01/19/21  Albuterol Inhaler           Allopurinol   100 mg   2       Eliquis   5 mg   1  1     Aspirin    81 mg  1       Atorvastatin   40 mg     1     Symbicort Inhaler  160-4.5         Carvedilol    25 mg  1  1     Farxiga   10 mg   1       Furosemide    40 mg   1       Glipizide    5 mg   0.5  0.5     Losartan    100 mg     1     Montelukast   10 mg      1      States he has extras of other medications

## 2021-01-15 ENCOUNTER — Telehealth: Payer: Self-pay | Admitting: Family

## 2021-01-15 ENCOUNTER — Ambulatory Visit: Payer: Medicaid Other | Admitting: Family

## 2021-01-15 NOTE — Telephone Encounter (Signed)
Patient did not show for his [facility] appointment on 01/15/21. Will attempt to reschedule.

## 2021-01-19 ENCOUNTER — Ambulatory Visit: Payer: Medicaid Other | Admitting: Pain Medicine

## 2021-01-21 ENCOUNTER — Telehealth: Payer: Self-pay | Admitting: Internal Medicine

## 2021-01-21 NOTE — Telephone Encounter (Signed)
Copied from [place] [phone]. Topic: General - Other >> Jan 19, 2021  9:00 AM Celene Kras wrote: Reason for CRM: Pt called stating that he was denied from his appt for the pain clinic. He is very upset that he was not contacted. Looking over notes for appt it states that the pts referral was denied. Pt is requesting to have a call back with more info. Please advise.

## 2021-01-26 ENCOUNTER — Other Ambulatory Visit: Payer: Self-pay

## 2021-01-26 NOTE — Patient Instructions (Signed)
Visit Information  [name]  - as a part of your Medicaid benefit, you are eligible for care management and care coordination services at no cost or copay. I was unable to reach you by phone today but would be happy to help you with your health related needs. Please feel free to call me @ [phone].   A member of the Managed Medicaid care management team will reach out to you again over the next 30 days.   [name], [name], [place] Managed Medicaid Team  [phone]

## 2021-01-26 NOTE — Patient Outreach (Signed)
Care Coordination  01/26/2021  Chris [name] October 11, 1960 [id number]   Medicaid Managed Care   Unsuccessful Outreach Note  01/26/2021 Name: [name] MRN: [id number] DOB: 11-27-1960  Referred by: [name], MD Reason for referral : No chief complaint on file.   An unsuccessful telephone outreach was attempted today. The patient was referred to the case management team for assistance with care management and care coordination.   Follow Up Plan: The care management team will reach out to the patient again over the next 30 days.   [name], [name], [place] Managed Medicaid Team  [phone]

## 2021-01-29 ENCOUNTER — Other Ambulatory Visit: Payer: Self-pay

## 2021-01-30 NOTE — Patient Outreach (Signed)
[facility]) Care Management  01/30/2021  Chris [name] Mar 22, 1961 [id number]  [facility] LCSW appointment rescheduled for 02/23/21. Patient made aware.   Eula Fried, [name], MSW, [facility] [facility] Managed Medicaid LCSW [email or web address] Phone: [phone]

## 2021-02-02 ENCOUNTER — Other Ambulatory Visit: Payer: Self-pay | Admitting: *Deleted

## 2021-02-02 NOTE — Patient Instructions (Signed)
Visit Information  [name]  - as a part of your Medicaid benefit, you are eligible for care management and care coordination services at no cost or copay. I was unable to reach you by phone today but would be happy to help you with your health related needs. Please feel free to call me @ [phone].   A member of the Managed Medicaid care management team will reach out to you again over the next 21 days.   [name] RN, BSN   [employer] manager

## 2021-02-02 NOTE — Patient Outreach (Signed)
Care Coordination  02/02/2021  Chris [name] 11/07/1960 [id number]   Medicaid Managed Care   Unsuccessful Outreach Note  02/02/2021 Name: Chris [name] MRN: [id number] DOB: December 14, 1960  Referred by: [name], MD Reason for referral : High Risk Managed Medicaid (Unsuccessful RNCM follow up outreach)   A second unsuccessful telephone outreach was attempted today. The patient was referred to the case management team for assistance with care management and care coordination.   Follow Up Plan: The care management team will reach out to the patient again over the next 21 days.   [name] RN, BSN [place]  [employer] manager

## 2021-02-09 ENCOUNTER — Emergency Department (HOSPITAL_COMMUNITY): Payer: Medicaid Other

## 2021-02-09 ENCOUNTER — Observation Stay (HOSPITAL_COMMUNITY)
Admission: EM | Admit: 2021-02-09 | Discharge: 2021-02-11 | Disposition: A | Discharge status: Home / Self Care | Payer: Medicaid Other | Attending: Internal Medicine | Admitting: Internal Medicine

## 2021-02-09 ENCOUNTER — Other Ambulatory Visit: Payer: Self-pay

## 2021-02-09 ENCOUNTER — Encounter (HOSPITAL_COMMUNITY): Payer: Self-pay | Admitting: Emergency Medicine

## 2021-02-09 DIAGNOSIS — N183 Chronic kidney disease, stage 3 unspecified: Secondary | ICD-10-CM | POA: Diagnosis not present

## 2021-02-09 DIAGNOSIS — Z79899 Other long term (current) drug therapy: Secondary | ICD-10-CM | POA: Diagnosis not present

## 2021-02-09 DIAGNOSIS — J9811 Atelectasis: Secondary | ICD-10-CM | POA: Diagnosis not present

## 2021-02-09 DIAGNOSIS — Z20822 Contact with and (suspected) exposure to covid-19: Secondary | ICD-10-CM | POA: Insufficient documentation

## 2021-02-09 DIAGNOSIS — I480 Paroxysmal atrial fibrillation: Secondary | ICD-10-CM | POA: Insufficient documentation

## 2021-02-09 DIAGNOSIS — I10 Essential (primary) hypertension: Secondary | ICD-10-CM | POA: Diagnosis not present

## 2021-02-09 DIAGNOSIS — R0602 Shortness of breath: Secondary | ICD-10-CM

## 2021-02-09 DIAGNOSIS — J441 Chronic obstructive pulmonary disease with (acute) exacerbation: Secondary | ICD-10-CM | POA: Diagnosis not present

## 2021-02-09 DIAGNOSIS — I5043 Acute on chronic combined systolic (congestive) and diastolic (congestive) heart failure: Secondary | ICD-10-CM | POA: Diagnosis not present

## 2021-02-09 DIAGNOSIS — I130 Hypertensive heart and chronic kidney disease with heart failure and stage 1 through stage 4 chronic kidney disease, or unspecified chronic kidney disease: Secondary | ICD-10-CM | POA: Insufficient documentation

## 2021-02-09 DIAGNOSIS — J811 Chronic pulmonary edema: Secondary | ICD-10-CM | POA: Diagnosis not present

## 2021-02-09 DIAGNOSIS — Z7984 Long term (current) use of oral hypoglycemic drugs: Secondary | ICD-10-CM | POA: Diagnosis not present

## 2021-02-09 DIAGNOSIS — R0789 Other chest pain: Secondary | ICD-10-CM | POA: Diagnosis not present

## 2021-02-09 DIAGNOSIS — E1122 Type 2 diabetes mellitus with diabetic chronic kidney disease: Secondary | ICD-10-CM | POA: Diagnosis not present

## 2021-02-09 DIAGNOSIS — F17210 Nicotine dependence, cigarettes, uncomplicated: Secondary | ICD-10-CM | POA: Insufficient documentation

## 2021-02-09 DIAGNOSIS — I5033 Acute on chronic diastolic (congestive) heart failure: Secondary | ICD-10-CM | POA: Diagnosis present

## 2021-02-09 DIAGNOSIS — J449 Chronic obstructive pulmonary disease, unspecified: Secondary | ICD-10-CM

## 2021-02-09 DIAGNOSIS — I5042 Chronic combined systolic (congestive) and diastolic (congestive) heart failure: Secondary | ICD-10-CM

## 2021-02-09 DIAGNOSIS — Z7901 Long term (current) use of anticoagulants: Secondary | ICD-10-CM | POA: Insufficient documentation

## 2021-02-09 DIAGNOSIS — R079 Chest pain, unspecified: Secondary | ICD-10-CM | POA: Diagnosis not present

## 2021-02-09 DIAGNOSIS — Z7982 Long term (current) use of aspirin: Secondary | ICD-10-CM | POA: Insufficient documentation

## 2021-02-09 DIAGNOSIS — R0902 Hypoxemia: Secondary | ICD-10-CM | POA: Diagnosis not present

## 2021-02-09 DIAGNOSIS — I2510 Atherosclerotic heart disease of native coronary artery without angina pectoris: Secondary | ICD-10-CM | POA: Insufficient documentation

## 2021-02-09 DIAGNOSIS — I499 Cardiac arrhythmia, unspecified: Secondary | ICD-10-CM | POA: Diagnosis not present

## 2021-02-09 DIAGNOSIS — J90 Pleural effusion, not elsewhere classified: Secondary | ICD-10-CM | POA: Diagnosis not present

## 2021-02-09 LAB — HEMOGLOBIN A1C
Hgb A1c MFr Bld: 6.9 % — ABNORMAL HIGH (ref 4.8–5.6)
Mean Plasma Glucose: 151.33 mg/dL

## 2021-02-09 LAB — BRAIN NATRIURETIC PEPTIDE: B Natriuretic Peptide: 443.3 pg/mL — ABNORMAL HIGH (ref 0.0–100.0)

## 2021-02-09 LAB — BASIC METABOLIC PANEL
Anion gap: 8 (ref 5–15)
BUN: 20 mg/dL (ref 6–20)
CO2: 22 mmol/L (ref 22–32)
Calcium: 9.4 mg/dL (ref 8.9–10.3)
Chloride: 108 mmol/L (ref 98–111)
Creatinine, Ser: 1.7 mg/dL — ABNORMAL HIGH (ref 0.61–1.24)
GFR, Estimated: 46 mL/min — ABNORMAL LOW (ref >60)
Glucose, Bld: 90 mg/dL (ref 70–99)
Potassium: 3.8 mmol/L (ref 3.5–5.1)
Sodium: 138 mmol/L (ref 135–145)

## 2021-02-09 LAB — CBC WITH DIFFERENTIAL/PLATELET
Abs Immature Granulocytes: 0.05 10*3/uL (ref 0.00–0.07)
Basophils Absolute: 0 10*3/uL (ref 0.0–0.1)
Basophils Relative: 0 %
Eosinophils Absolute: 0.3 10*3/uL (ref 0.0–0.5)
Eosinophils Relative: 3 %
HCT: 47.2 % (ref 39.0–52.0)
Hemoglobin: 15.6 g/dL (ref 13.0–17.0)
Immature Granulocytes: 1 %
Lymphocytes Relative: 29 %
Lymphs Abs: 2.9 10*3/uL (ref 0.7–4.0)
MCH: 30.4 pg (ref 26.0–34.0)
MCHC: 33.1 g/dL (ref 30.0–36.0)
MCV: 91.8 fL (ref 80.0–100.0)
Monocytes Absolute: 1 10*3/uL (ref 0.1–1.0)
Monocytes Relative: 10 %
Neutro Abs: 5.8 10*3/uL (ref 1.7–7.7)
Neutrophils Relative %: 57 %
Platelets: 180 10*3/uL (ref 150–400)
RBC: 5.14 MIL/uL (ref 4.22–5.81)
RDW: 13.9 % (ref 11.5–15.5)
WBC: 10 10*3/uL (ref 4.0–10.5)
nRBC: 0 % (ref 0.0–0.2)

## 2021-02-09 LAB — RESP PANEL BY RT-PCR (FLU A&B, COVID) ARPGX2
Influenza A by PCR: NEGATIVE
Influenza B by PCR: NEGATIVE
SARS Coronavirus 2 by RT PCR: NEGATIVE

## 2021-02-09 LAB — CK: Total CK: 271 U/L (ref 49–397)

## 2021-02-09 LAB — TROPONIN I (HIGH SENSITIVITY)
Troponin I (High Sensitivity): 56 ng/L — ABNORMAL HIGH (ref <18)
Troponin I (High Sensitivity): 58 ng/L — ABNORMAL HIGH (ref <18)

## 2021-02-09 LAB — HIV ANTIBODY (ROUTINE TESTING W REFLEX): HIV Screen 4th Generation wRfx: REACTIVE — AB

## 2021-02-09 MED ORDER — ATORVASTATIN CALCIUM 40 MG PO TABS
40.0000 mg | ORAL_TABLET | Freq: Every day | ORAL | Status: DC
  Administered 2021-02-10 – 2021-02-11 (×2): 40 mg via ORAL
  Filled 2021-02-09 (×2): qty 1

## 2021-02-09 MED ORDER — ALLOPURINOL 100 MG PO TABS
200.0000 mg | ORAL_TABLET | Freq: Every day | ORAL | Status: DC
  Administered 2021-02-10 – 2021-02-11 (×2): 200 mg via ORAL
  Filled 2021-02-09 (×3): qty 2

## 2021-02-09 MED ORDER — DAPAGLIFLOZIN PROPANEDIOL 10 MG PO TABS
10.0000 mg | ORAL_TABLET | Freq: Every day | ORAL | Status: DC
  Administered 2021-02-10 – 2021-02-11 (×2): 10 mg via ORAL
  Filled 2021-02-09 (×2): qty 1

## 2021-02-09 MED ORDER — BUDESONIDE 0.5 MG/2ML IN SUSP
2.0000 mg | Freq: Four times a day (QID) | RESPIRATORY_TRACT | Status: DC
  Administered 2021-02-10: 0.5 mg via RESPIRATORY_TRACT
  Filled 2021-02-09 (×3): qty 8

## 2021-02-09 MED ORDER — INSULIN ASPART 100 UNIT/ML IJ SOLN
0.0000 [IU] | Freq: Three times a day (TID) | INTRAMUSCULAR | Status: DC
  Administered 2021-02-10: 5 [IU] via SUBCUTANEOUS
  Administered 2021-02-10 (×2): 3 [IU] via SUBCUTANEOUS

## 2021-02-09 MED ORDER — MONTELUKAST SODIUM 10 MG PO TABS
10.0000 mg | ORAL_TABLET | Freq: Every day | ORAL | Status: DC
  Administered 2021-02-10: 10 mg via ORAL
  Filled 2021-02-09: qty 1

## 2021-02-09 MED ORDER — ACETAMINOPHEN 325 MG PO TABS
650.0000 mg | ORAL_TABLET | Freq: Four times a day (QID) | ORAL | Status: DC | PRN
  Administered 2021-02-09: 650 mg via ORAL
  Filled 2021-02-09: qty 2

## 2021-02-09 MED ORDER — ONDANSETRON HCL 4 MG PO TABS: 4.0000 mg | ORAL_TABLET | Freq: Four times a day (QID) | ORAL | Status: DC | PRN

## 2021-02-09 MED ORDER — IPRATROPIUM-ALBUTEROL 0.5-2.5 (3) MG/3ML IN SOLN
3.0000 mL | Freq: Once | RESPIRATORY_TRACT | Status: AC
  Administered 2021-02-09: 3 mL via RESPIRATORY_TRACT
  Filled 2021-02-09: qty 3

## 2021-02-09 MED ORDER — PREDNISONE 20 MG PO TABS
40.0000 mg | ORAL_TABLET | Freq: Every day | ORAL | Status: DC
  Administered 2021-02-10 – 2021-02-11 (×2): 40 mg via ORAL
  Filled 2021-02-09 (×2): qty 2

## 2021-02-09 MED ORDER — ASPIRIN EC 81 MG PO TBEC
81.0000 mg | DELAYED_RELEASE_TABLET | Freq: Every day | ORAL | Status: DC
  Administered 2021-02-10 – 2021-02-11 (×2): 81 mg via ORAL
  Filled 2021-02-09 (×2): qty 1

## 2021-02-09 MED ORDER — ONDANSETRON HCL 4 MG/2ML IJ SOLN: 4.0000 mg | Freq: Four times a day (QID) | INTRAMUSCULAR | Status: DC | PRN

## 2021-02-09 MED ORDER — GLIPIZIDE 5 MG PO TABS
2.5000 mg | ORAL_TABLET | Freq: Two times a day (BID) | ORAL | Status: DC
  Filled 2021-02-09: qty 0.5
  Filled 2021-02-09: qty 1

## 2021-02-09 MED ORDER — ISOSORB DINITRATE-HYDRALAZINE 20-37.5 MG PO TABS
1.0000 | ORAL_TABLET | Freq: Three times a day (TID) | ORAL | Status: DC
  Administered 2021-02-09 – 2021-02-10 (×4): 1 via ORAL
  Filled 2021-02-09 (×6): qty 1

## 2021-02-09 MED ORDER — MOMETASONE FURO-FORMOTEROL FUM 200-5 MCG/ACT IN AERO
2.0000 | INHALATION_SPRAY | Freq: Two times a day (BID) | RESPIRATORY_TRACT | Status: DC
  Administered 2021-02-10 – 2021-02-11 (×2): 2 via RESPIRATORY_TRACT

## 2021-02-09 MED ORDER — ACETAMINOPHEN 650 MG RE SUPP: 650.0000 mg | Freq: Four times a day (QID) | RECTAL | Status: DC | PRN

## 2021-02-09 MED ORDER — NICOTINE 21 MG/24HR TD PT24
21.0000 mg | MEDICATED_PATCH | Freq: Every day | TRANSDERMAL | Status: DC
  Administered 2021-02-10: 21 mg via TRANSDERMAL
  Filled 2021-02-09: qty 1

## 2021-02-09 MED ORDER — FUROSEMIDE 40 MG PO TABS
40.0000 mg | ORAL_TABLET | Freq: Every day | ORAL | Status: DC
  Administered 2021-02-10 – 2021-02-11 (×2): 40 mg via ORAL
  Filled 2021-02-09 (×2): qty 1

## 2021-02-09 MED ORDER — METHYLPREDNISOLONE SODIUM SUCC 125 MG IJ SOLR
125.0000 mg | Freq: Once | INTRAMUSCULAR | Status: AC
  Administered 2021-02-09: 125 mg via INTRAVENOUS
  Filled 2021-02-09: qty 2

## 2021-02-09 MED ORDER — CARVEDILOL 6.25 MG PO TABS
6.2500 mg | ORAL_TABLET | Freq: Two times a day (BID) | ORAL | Status: DC
  Administered 2021-02-09 – 2021-02-11 (×4): 6.25 mg via ORAL
  Filled 2021-02-09: qty 1
  Filled 2021-02-09: qty 2
  Filled 2021-02-09 (×2): qty 1

## 2021-02-09 MED ORDER — MOMETASONE FURO-FORMOTEROL FUM 200-5 MCG/ACT IN AERO
2.0000 | INHALATION_SPRAY | Freq: Two times a day (BID) | RESPIRATORY_TRACT | Status: DC
  Filled 2021-02-09: qty 8.8

## 2021-02-09 MED ORDER — ALBUTEROL SULFATE (2.5 MG/3ML) 0.083% IN NEBU: 3.0000 mL | INHALATION_SOLUTION | Freq: Four times a day (QID) | RESPIRATORY_TRACT | Status: DC | PRN

## 2021-02-09 MED ORDER — ACETAMINOPHEN 325 MG PO TABS
650.0000 mg | ORAL_TABLET | Freq: Once | ORAL | Status: AC
  Administered 2021-02-09: 650 mg via ORAL
  Filled 2021-02-09: qty 2

## 2021-02-09 MED ORDER — FUROSEMIDE 10 MG/ML IJ SOLN
40.0000 mg | Freq: Once | INTRAMUSCULAR | Status: AC
  Administered 2021-02-09: 40 mg via INTRAVENOUS
  Filled 2021-02-09: qty 4

## 2021-02-09 MED ORDER — IPRATROPIUM-ALBUTEROL 0.5-2.5 (3) MG/3ML IN SOLN
3.0000 mL | Freq: Four times a day (QID) | RESPIRATORY_TRACT | Status: DC
  Administered 2021-02-09: 3 mL via RESPIRATORY_TRACT
  Filled 2021-02-09 (×2): qty 3

## 2021-02-09 MED ORDER — APIXABAN 5 MG PO TABS
5.0000 mg | ORAL_TABLET | Freq: Two times a day (BID) | ORAL | Status: DC
  Administered 2021-02-09 – 2021-02-11 (×4): 5 mg via ORAL
  Filled 2021-02-09 (×4): qty 1

## 2021-02-09 MED ORDER — LORAZEPAM 1 MG PO TABS
0.5000 mg | ORAL_TABLET | Freq: Two times a day (BID) | ORAL | Status: AC
  Administered 2021-02-09 (×2): 0.5 mg via ORAL
  Filled 2021-02-09 (×2): qty 1

## 2021-02-09 MED ORDER — CYCLOBENZAPRINE HCL 10 MG PO TABS
10.0000 mg | ORAL_TABLET | Freq: Once | ORAL | Status: AC
  Administered 2021-02-09: 10 mg via ORAL
  Filled 2021-02-09: qty 1

## 2021-02-09 MED ORDER — LOSARTAN POTASSIUM 50 MG PO TABS
100.0000 mg | ORAL_TABLET | Freq: Every day | ORAL | Status: DC
  Administered 2021-02-09 – 2021-02-10 (×2): 100 mg via ORAL
  Filled 2021-02-09 (×2): qty 2

## 2021-02-09 NOTE — ED Notes (Signed)
Pt alert, NAD, calm, interactive, resps e/u, speaking in clear complete sentences, c/o generalized pain, CP and HA, also sob, and hunger, "wants to eat", VSS, BP high.

## 2021-02-09 NOTE — ED Notes (Signed)
Given food and drink per request, per EDP, and updated.

## 2021-02-09 NOTE — ED Notes (Signed)
Dr. Myna Hidalgo paged. Will not come down to see pt for his complaints. Pt made aware. This RN reiterated importance of fluid restriction. Will not be completing med pass.
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
## 2021-02-09 NOTE — ED Notes (Signed)
Pt refusing to tele monitoring equipment on. Continuously asking for fluids and has been educated on the importance of fluids restriction.

## 2021-02-09 NOTE — ED Notes (Signed)
Pt standing at door staring at this RN. Pt asked to go back into his room and to stop staring at this RN in an intimidating manner. Charge RN aware of situation. Security will become involved if pt continues behavior.

## 2021-02-09 NOTE — ED Notes (Signed)
Warm blanket and drink given.

## 2021-02-09 NOTE — ED Notes (Signed)
Admitting MD speaking with pt. Pt given 2 warm blankets.

## 2021-02-09 NOTE — ED Notes (Addendum)
meds given. Lab at [facility] for delta trop. C/o HA and CP. Frustrated, irritable. Attempted therapeutic conversation. C/o hunger, wants to eat. Delay explained with rationale.

## 2021-02-09 NOTE — Social Work (Signed)
CSW was called to bedside by Pt while CSW was walking past room. Pt requesting sack lunch but there are none available. CSW provided Pt with crackers and peanut butter and cheese.  Pt states that his home nebulizer machine was supplied by [facility] and does not know why it is no longer working. Chemical engineer # as  [phone].

## 2021-02-09 NOTE — ED Notes (Signed)
Pt given gingerale. Stated food tray given did not have meat on it. Called nutrition services, new food tray ordered.

## 2021-02-09 NOTE — ED Notes (Signed)
Lab at [facility], pt using urinal.

## 2021-02-09 NOTE — ED Notes (Signed)
Meds pending confirmation of med re, pharmD and pharm tech aware, awaiting contact with emergency contact, pt "does not know meds".

## 2021-02-09 NOTE — ED Notes (Addendum)
Pt became very aggitated stating "you people don't care about black people."  RN assured pt he would be getting the next room.  RN moved pt to a recliner for comfort in triage.  RN went to get pt a blanket and pt stated "that took long enoug....lazy as hell."
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
## 2021-02-09 NOTE — ED Notes (Signed)
Pt irritable, wants to leave, frustrated, updated pt with plan, EDP notified.

## 2021-02-09 NOTE — ED Notes (Signed)
Eating 2nd happy meal

## 2021-02-09 NOTE — ED Triage Notes (Signed)
Pt from home, began having chest pain last night at 8pm.  Pain described as pressure and goes to both arms, describes arms as numb also has cramping in legs.    Give 324 ASA 2 nitro - 188/108   to  141/94 97 SR 94% RA CBG 143 18G L hand

## 2021-02-09 NOTE — ED Provider Notes (Signed)
Emergency Medicine Provider Triage Evaluation Note  Chris [name] , a 60 y.o. male  was evaluated in triage.  Pt complains of chest pain.  States it feels like pressure on chest and radiating into the arms.  Complex medication history including advanced CHF.  States symptoms worsening x3 days.  He does feel SOB, worse with lying flat.  Denies LE edema.  Review of Systems  Positive: Chest pain, SOB Negative: Cough, fever  Physical Exam  BP (!) 130/104   Pulse 88   Temp 98.2 F (36.8 C) (Oral)   Resp (!) 22   SpO2 94%   Gen:   Awake, appears unwell Resp:  Normal effort  MSK:   Moves extremities without difficulty, ankle monitor right leg Other:  Slumped to left during exam, near syncope observed, did NOT fully lose consciousness  Medical Decision Making  Medically screening exam initiated at 6:25 AM.  Appropriate orders placed.  Chris [name] was informed that the remainder of the evaluation will be completed by another provider, this initial triage assessment does not replace that evaluation, and the importance of remaining in the ED until their evaluation is complete.  Appears unwell in triage.  Appeared to have near syncopal event during triage but did NOT lose consciousness.  Does have significant cardiac and medical history.  EF 20-25%.  Work-up initiated with labs, EKG, CXR.  Made acuity 2.  Charge RN notified, will expedite room assignment.   Larene Pickett, PA-C 02/09/21 [place], MD 02/09/21 [phone]

## 2021-02-09 NOTE — ED Notes (Signed)
Pt raising voice at this RN when attempting med pass. Pt upset that this RN will only give a half a cup of gingerale. Asking to speak to MD. MD paged. Will attempt med pass at a later time

## 2021-02-10 ENCOUNTER — Ambulatory Visit (HOSPITAL_BASED_OUTPATIENT_CLINIC_OR_DEPARTMENT_OTHER): Payer: Medicaid Other

## 2021-02-10 DIAGNOSIS — R079 Chest pain, unspecified: Secondary | ICD-10-CM

## 2021-02-10 DIAGNOSIS — I5033 Acute on chronic diastolic (congestive) heart failure: Secondary | ICD-10-CM | POA: Diagnosis not present

## 2021-02-10 DIAGNOSIS — I10 Essential (primary) hypertension: Secondary | ICD-10-CM

## 2021-02-10 DIAGNOSIS — E1122 Type 2 diabetes mellitus with diabetic chronic kidney disease: Secondary | ICD-10-CM | POA: Diagnosis not present

## 2021-02-10 DIAGNOSIS — J441 Chronic obstructive pulmonary disease with (acute) exacerbation: Secondary | ICD-10-CM

## 2021-02-10 LAB — GLUCOSE, CAPILLARY
Glucose-Capillary: 171 mg/dL — ABNORMAL HIGH (ref 70–99)
Glucose-Capillary: 205 mg/dL — ABNORMAL HIGH (ref 70–99)
Glucose-Capillary: 151 mg/dL — ABNORMAL HIGH (ref 70–99)
Glucose-Capillary: 152 mg/dL — ABNORMAL HIGH (ref 70–99)
Glucose-Capillary: 113 mg/dL — ABNORMAL HIGH (ref 70–99)

## 2021-02-10 LAB — ECHOCARDIOGRAM COMPLETE
Area-P 1/2: 4.01 cm2
S' Lateral: 5.1 cm
Single Plane A4C EF: 48.1 %

## 2021-02-10 MED ORDER — IPRATROPIUM-ALBUTEROL 0.5-2.5 (3) MG/3ML IN SOLN
3.0000 mL | Freq: Four times a day (QID) | RESPIRATORY_TRACT | Status: DC
  Administered 2021-02-10: 3 mL via RESPIRATORY_TRACT

## 2021-02-10 MED ORDER — ALBUTEROL SULFATE (2.5 MG/3ML) 0.083% IN NEBU
2.5000 mg | INHALATION_SOLUTION | RESPIRATORY_TRACT | Status: DC | PRN
  Administered 2021-02-10: 2.5 mg via RESPIRATORY_TRACT
  Filled 2021-02-10: qty 3

## 2021-02-10 MED ORDER — LORAZEPAM 0.5 MG PO TABS
0.5000 mg | ORAL_TABLET | Freq: Four times a day (QID) | ORAL | Status: DC | PRN
  Administered 2021-02-10 – 2021-02-11 (×3): 0.5 mg via ORAL
  Filled 2021-02-10 (×3): qty 1

## 2021-02-10 MED ORDER — BUDESONIDE 0.5 MG/2ML IN SUSP
0.5000 mg | Freq: Two times a day (BID) | RESPIRATORY_TRACT | Status: DC
  Administered 2021-02-10 – 2021-02-11 (×2): 0.5 mg via RESPIRATORY_TRACT
  Filled 2021-02-10 (×2): qty 2

## 2021-02-10 MED ORDER — CYCLOBENZAPRINE HCL 10 MG PO TABS: 10.0000 mg | ORAL_TABLET | Freq: Three times a day (TID) | ORAL | Status: DC | PRN

## 2021-02-10 MED ORDER — GUAIFENESIN-DM 100-10 MG/5ML PO SYRP
5.0000 mL | ORAL_SOLUTION | ORAL | Status: DC | PRN
  Administered 2021-02-11: 5 mL via ORAL
  Filled 2021-02-10: qty 5

## 2021-02-10 MED ORDER — IPRATROPIUM-ALBUTEROL 0.5-2.5 (3) MG/3ML IN SOLN
3.0000 mL | Freq: Two times a day (BID) | RESPIRATORY_TRACT | Status: DC
  Administered 2021-02-10 – 2021-02-11 (×2): 3 mL via RESPIRATORY_TRACT
  Filled 2021-02-10 (×2): qty 3

## 2021-02-10 NOTE — Plan of Care (Signed)

## 2021-02-10 NOTE — Plan of Care (Signed)

## 2021-02-10 NOTE — ED Notes (Signed)
2W not answering phone x3

## 2021-02-10 NOTE — Progress Notes (Signed)
  Echocardiogram 2D Echocardiogram has been performed.  Chris [name] 02/10/2021, 2:53 PM
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
## 2021-02-10 NOTE — ED Notes (Signed)
Pt sleeping. Respirations even, rise and fall of chest noted. Tele monitoring cords continue to be off of patient.

## 2021-02-10 NOTE — Progress Notes (Signed)
Pt arrived to the floor from ED via wheelchair, PT was oriented to his room, call bell in place. Pt was also verbally inappropriate to staff Upon arrival to the floor.

## 2021-02-10 NOTE — TOC Initial Note (Addendum)
Transition of Care [facility]) - Initial/Assessment Note    Patient Details  Name: Chris [name] MRN: [id number] Date of Birth: [birth year]  [facility]) CM/SW Contact:    Angelita Ingles, RN Phone Number:[phone]  02/10/2021, 12:01 PM  Clinical Narrative:                 [facility] consulted for patient in  need of nebulizer. CM attempted to order nebulizer through [employer]. Per [employer] patient  had nebulizer machine in 2021 and he will not be eligible to get another one. Patient has option to bring his broken nebulizer to the [place] office which is in [facility] or to call number on machine to determine if the machine has a warranty. Patients states that he has no transportation and he does not drive so he is not able to go to retail store to trade in broken machine. [employer] has no other options for the patient to obtain nebulizer machine. TOC unable to obtain nebulizer machine at this time. TOC will continue to follow.   Expected Discharge Plan: Home/Self Care Barriers to Discharge: Continued Medical Work up   Patient Goals and CMS Choice Patient states their goals for this hospitalization and ongoing recovery are:: Wants to get better to go home CMS Medicare.gov Compare Post Acute Care list provided to:: Patient Choice offered to / list presented to : Patient  Expected Discharge Plan and Services Expected Discharge Plan: Home/Self Care   Discharge Planning Services: CM Consult Post Acute Care Choice: Durable Medical Equipment Living arrangements for the past 2 months: Single Family Home                 DME Arranged: Other see comment (attempting to arrange nebulizer but patient was issues one in 2021 so he may not be able to get another one.) DME Agency: [employer] Date DME Agency Contacted: 02/10/21 Time DME Agency Contacted: 51 Representative spoke with at DME Agency: [place]: NA [place] Agency: NA        Prior Living Arrangements/Services Living arrangements  for the past 2 months: [place] with:: Self Patient language and need for interpreter reviewed:: Yes Do you feel safe going back to the place where you live?: Yes      Need for Family Participation in Patient Care: Yes (Comment) Care giver support system in place?: Yes (comment) Current home services: DME (nebulizer but states that it doesnt work) Architect Involvement Pertinent to Current Situation/Hospitalization: No - Comment as needed  Activities of Daily Living      Permission Sought/Granted   Permission granted to share information with : No              Emotional Assessment Appearance:: Appears stated age Attitude/Demeanor/Rapport: Engaged Affect (typically observed): Pleasant Orientation: : Oriented to Self, Oriented to Place, Oriented to  Time, Oriented to Situation      Admission diagnosis:  Shortness of breath [R06.02] COPD (chronic obstructive pulmonary disease) ([place]) [J44.9] Chest pain, unspecified type [R07.9] Patient Active Problem List   Diagnosis Date Noted   Left-sided weakness 10/28/2020   COPD exacerbation ([place])    Typical atrial flutter ([place])    Acute respiratory failure with hypoxia ([place]) 07/05/2020   CHF (congestive heart failure) ([place]) 07/04/2020   Acute exacerbation of CHF (congestive heart failure) ([place]) 06/16/2020   COPD with acute exacerbation ([place]) 06/16/2020   Influenza vaccine refused 05/06/2020   Acute on chronic combined systolic (congestive) and diastolic (congestive) heart  failure ([place]) 05/05/2020   Acute decompensated heart failure ([place]) 05/04/2020   Illiteracy 05/04/2020   Chronic respiratory failure with hypoxia, on home oxygen therapy ([place]) 12/28/2019   Type 2 diabetes mellitus with stage 3 chronic kidney disease ([place]) 12/25/2019   Acute and chronic respiratory failure (acute-on-chronic) ([place]) 12/25/2019   Acute on chronic combined systolic and diastolic CHF (congestive heart failure) ([place]) 10/26/2019    Elevated troponin I level 10/26/2019   Acute on chronic diastolic (congestive) heart failure ([place]) 10/26/2019   History of gout 02/01/2019   Seasonal allergic rhinitis due to pollen 02/01/2019   Tobacco dependence 11/30/2018   Microscopic hematuria 11/30/2018   Depression 11/30/2018   Difficulty controlling anger 11/30/2018   CAP (community acquired pneumonia) 08/11/2018   COPD (chronic obstructive pulmonary disease) ([place])    CKD (chronic kidney disease) stage 3, GFR 30-59 ml/min ([place]) 08/10/2018   Recurrent epistaxis 04/21/2018   Mixed hyperlipidemia 07/28/2017   Essential hypertension [id number]   Chronic systolic heart failure ([place]) 10/25/2014   Cocaine abuse ([place]) 02/20/2013   Cannabis abuse 02/20/2013   Back pain, chronic 02/20/2013   PCP:  [name], MD Pharmacy:   [facility] [place] [address] [place] [address] Phone: [phone] Fax: [phone]  [facility] - [place], [place] - [address] [place] [address] Phone: [phone] Fax: [phone]     Social Determinants of Health (SDOH) Interventions    Readmission Risk Interventions Readmission Risk Prevention Plan 10/29/2019  Transportation Screening Complete  PCP or Specialist Appt within 3-5 Days Complete  HRI or [place] Complete  Social Work Consult for [place] Planning/Counseling Complete  Palliative Care Screening Not Applicable  Medication Review Press photographer) Complete  Some recent data might be hidden

## 2021-02-10 NOTE — Progress Notes (Signed)
The patient is refusing to wear continuous telemetry monitor and pulse oximetry. MD notified.

## 2021-02-11 ENCOUNTER — Other Ambulatory Visit: Payer: Self-pay

## 2021-02-11 DIAGNOSIS — J441 Chronic obstructive pulmonary disease with (acute) exacerbation: Secondary | ICD-10-CM | POA: Diagnosis not present

## 2021-02-11 LAB — CBC
HCT: 43.2 % (ref 39.0–52.0)
Hemoglobin: 14.6 g/dL (ref 13.0–17.0)
MCH: 30.7 pg (ref 26.0–34.0)
MCHC: 33.8 g/dL (ref 30.0–36.0)
MCV: 90.9 fL (ref 80.0–100.0)
Platelets: 185 10*3/uL (ref 150–400)
RBC: 4.75 MIL/uL (ref 4.22–5.81)
RDW: 14.1 % (ref 11.5–15.5)
WBC: 16.2 10*3/uL — ABNORMAL HIGH (ref 4.0–10.5)
nRBC: 0 % (ref 0.0–0.2)

## 2021-02-11 LAB — BASIC METABOLIC PANEL
Anion gap: 9 (ref 5–15)
BUN: 44 mg/dL — ABNORMAL HIGH (ref 6–20)
CO2: 22 mmol/L (ref 22–32)
Calcium: 9.6 mg/dL (ref 8.9–10.3)
Chloride: 105 mmol/L (ref 98–111)
Creatinine, Ser: 2.27 mg/dL — ABNORMAL HIGH (ref 0.61–1.24)
GFR, Estimated: 33 mL/min — ABNORMAL LOW (ref >60)
Glucose, Bld: 141 mg/dL — ABNORMAL HIGH (ref 70–99)
Potassium: 4.1 mmol/L (ref 3.5–5.1)
Sodium: 136 mmol/L (ref 135–145)

## 2021-02-11 LAB — MAGNESIUM: Magnesium: 2 mg/dL (ref 1.7–2.4)

## 2021-02-11 LAB — GLUCOSE, CAPILLARY
Glucose-Capillary: 99 mg/dL (ref 70–99)
Glucose-Capillary: 113 mg/dL — ABNORMAL HIGH (ref 70–99)

## 2021-02-11 MED ORDER — ISOSORB DINITRATE-HYDRALAZINE 20-37.5 MG PO TABS
1.0000 | ORAL_TABLET | Freq: Three times a day (TID) | ORAL | 1 refills | Status: AC
  Filled 2021-02-11 – 2021-07-08 (×2): qty 90, 30d supply, fill #0

## 2021-02-11 MED ORDER — PREDNISONE 20 MG PO TABS
40.0000 mg | ORAL_TABLET | Freq: Every day | ORAL | 0 refills | Status: DC
Start: 2021-02-12 — End: 2021-04-16
  Filled 2021-02-11: qty 10, 5d supply, fill #0
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
## 2021-02-11 NOTE — Discharge Instructions (Signed)

## 2021-02-12 ENCOUNTER — Telehealth: Payer: Medicaid Other | Admitting: Internal Medicine

## 2021-02-12 ENCOUNTER — Telehealth: Payer: Self-pay

## 2021-02-12 ENCOUNTER — Other Ambulatory Visit: Payer: Self-pay

## 2021-02-12 ENCOUNTER — Telehealth: Payer: Self-pay | Admitting: *Deleted

## 2021-02-12 NOTE — Telephone Encounter (Addendum)
Transition Care Management Follow-up Telephone Call  The patient walked into the [facility] this morning to schedule an appointment with PCP and to pick up medications. This CM met with him.   Date of discharge and from where: 02/11/2021, [facility]  How have you been since you were released from the hospital? He said he is feeling weak. This hospitalization scared him.  Any questions or concerns? Yes - he needs assistance with understanding his medications.  He said that he has difficulty reading.   Items Reviewed: Did the pt receive and understand the discharge instructions provided?  He has the instructions but has not reviewed them yet.  He gave them to this CM.  Medications obtained and verified?  He just picked up the prednisone and bidil at [facility]  This CM started to review the medication list with him and he said he does not have any of the medications and has not taken medications for about a month.  He currently has prescriptions filled at [facility] and they are delivered to him but he is willing to have [facility] all of his medications today so he can start taking them as soon as possible.He said he will be back this afternoon after 1400 to pick them up and review them with this CM.  Other? No  Any new allergies since your discharge? No  Dietary orders reviewed? No Do you have support at home?  Lives alone  Home Care and Equipment/Supplies: Were home health services ordered? no If so, what is the name of the agency? N/a  Has the agency set up a time to come to the patient's home? not applicable Were any new equipment or medical supplies ordered?  Yes: nebulizer What is the name of the medical supply agency? [facility] [employer] Were you able to get the supplies/equipment? no Do you have any questions related to the use of the equipment or supplies? No  This CM called [facility], spoke to [facility] who said that an order for a nebulizer is being  processed and they still need to confirm insurance coverage.  If the new machine is not covered, the patient can take his broken machine to [facility] location to see if they will exchange it for a new machine. This is not a guarantee that they will provide him with a new machine.  [address] [place] [address] # [phone].   Functional Questionnaire: (I = Independent and D = Dependent) ADLs: independent   Follow up appointments reviewed:  PCP Hospital f/u appt confirmed? Yes  Scheduled to see Dr Wynetta Emery  on 02/19/2021 @ 1050. [facility] f/u appt confirmed? Yes  Scheduled to see cardiology on 02/25/2021.  Are transportation arrangements needed?  He relies on his brother for transportation,  If their condition worsens, is the pt aware to call PCP or go to the Emergency Dept.? Yes Was the patient provided with contact information for the PCP's office or ED? Yes Was to pt encouraged to call back with questions or concerns? Yes   1615- call placed to patient and informed him that the medications will be ready for pick up at 1630.  He said that the person who drives him left for work so he will pick them up in the morning.  Explained to him that the discharge AVS will be with his medications as well as the appointment reminder with Dr Wynetta Emery.   Instructed him to being  a medication box with him when he picks up the medications tomorrow and the pharmacist will be able to fill the box for him.  Georgiann Mohs, [facility] aware of this plan

## 2021-02-12 NOTE — Telephone Encounter (Signed)
Transition Care Management Unsuccessful Follow-up Telephone Call  Date of discharge and from where:  02/11/2021 - [facility]  Attempts:  1st Attempt  Reason for unsuccessful TCM follow-up call:  Unable to reach patient

## 2021-02-13 ENCOUNTER — Other Ambulatory Visit: Payer: Self-pay

## 2021-02-13 LAB — HIV-1/HIV-2 QUALITATIVE RNA

## 2021-02-13 LAB — HIV-1/2 AB - DIFFERENTIATION
HIV 1 Ab: NONREACTIVE
HIV 2 Ab: NONREACTIVE
Note: NEGATIVE

## 2021-02-13 NOTE — Telephone Encounter (Signed)
Transition Care Management Follow-up Telephone Call Date of discharge and from where: 02/11/2021 - [facility]  How have you been since you were released from the hospital? "I am okay" Any questions or concerns? No  Items Reviewed: Did the pt receive and understand the discharge instructions provided? Yes  Medications obtained and verified? Yes  Other? No  Any new allergies since your discharge? No  Dietary orders reviewed? No Do you have support at home? Yes    Functional Questionnaire: (I = Independent and D = Dependent) ADLs: I  Bathing/Dressing- I  Meal Prep- I  Eating- I  Maintaining continence- I  Transferring/Ambulation- I  Managing Meds- I  Follow up appointments reviewed:  PCP Hospital f/u appt confirmed? Yes  Scheduled to see Dr. Wynetta Emery on 02/19/2021 @ 1050. [facility] f/u appt confirmed? No   Are transportation arrangements needed? No  If their condition worsens, is the pt aware to call PCP or go to the Emergency Dept.? Yes Was the patient provided with contact information for the PCP's office or ED? Yes Was to pt encouraged to call back with questions or concerns? Yes

## 2021-02-17 ENCOUNTER — Other Ambulatory Visit: Payer: Self-pay | Admitting: *Deleted

## 2021-02-17 ENCOUNTER — Other Ambulatory Visit: Payer: Self-pay

## 2021-02-17 NOTE — Patient Instructions (Signed)
Visit Information  Chris [name] was given information about Medicaid Managed Care team care coordination services as a part of their [facility] Medicaid benefit. Chris [name] verbally consented to engagement with the [facility] Managed Care team.   If you are experiencing a medical emergency, please call 911 or report to your local emergency department or urgent care.   If you have a non-emergency medical problem during routine business hours, please contact your provider's office and ask to speak with a nurse.   For questions related to your Healthy [facility] health plan, please call: [phone] or visit the homepage here: [email or web address]  If you would like to schedule transportation through your [facility] plan, please call the following number at least 2 days in advance of your appointment: [phone]  Call the [facility] at [phone], at any time, 24 hours a day, 7 days a week. If you are in danger or need immediate medical attention call 911.  If you would like help to quit smoking, call 1-800-QUIT-NOW (703)250-8382) OR Espaol: 1-855-Djelo-Ya (7-616-073-7106) o para ms informacin haga clic aqu or Text READY to 200-400 to register via text  Chris [name] - following are the goals we discussed in your visit today:   Goals Addressed             This Visit's Progress    Track and Manage Fluids and Swelling-Heart Failure       Timeframe:  Long-Range Goal Priority:  High Start Date: 07/02/20                         Expected End Date: 03/19/21      Follow up 03/19/21              - eat a heart healthy diet, low fat, no salt -Call to fill prescriptions one week before I run out of medication -Take all medications as prescribed - call office if I gain more than 2 pounds in one day or 5 pounds in one week - use salt in moderation - watch for swelling in feet, ankles and legs every day    Why is this  important?   It is important to check your weight daily and watch how much salt and liquids you have.  It will help you to manage your heart failure.         Track and Manage Symptoms-Heart Failure       Timeframe:  Long-Range Goal Priority:  Medium Start Date:  07/02/20                           Expected End Date: 03/19/21                     Follow up 03/19/21    - call and make an appointment with Cardiology-Dr. Clayborn Bigness [phone] - continue to work on smoking cessation - eat more whole grains, fruits and vegetables, lean meats and healthy fats - know when to call the doctor - track symptoms and what helps feel better or worse - dress right for the weather, hot or cold  - contact provider with any concerns or questions - attend all scheduled appointments: PCP 02/19/21 and Heart Failure Clinic 02/25/21   Why is this important?   You will be able to handle your symptoms better if you keep track of them.  Making some simple changes to your lifestyle  will help.  Eating healthy is one thing you can do to take good care of yourself.            Please see education materials related to CHF and diabetes provided as print materials.   The patient verbalized understanding of instructions provided today and agreed to receive a mailed copy of patient instruction and/or educational materials.  Telephone follow up appointment with Managed Medicaid care management team member scheduled for:03/19/21 @ 11:15am  [name] RN, BSN [facility] RN Care Coordinator   Following is a copy of your plan of care:  Patient Care Plan: Heart Failure (Adult)     Problem Identified: Symptom Exacerbation (Heart Failure)      Long-Range Goal: Symptom Exacerbation Prevented or Minimized   Start Date: 07/02/2020  Expected End Date: 03/19/2021  Recent Progress: On track  Priority: High  Note:   Current Barriers:  Chronic Disease Management, support and educational  needs related to management of multiple chronic diagnosis. Chris [name] is managing CHF, DMII, stage III CKD and chronic respiratory failure. He missed his last appointment with his PCP and cardiologist. He reports eating right, checking his blood sugar daily and taking all of his medications. He recently moved and is living with his brother, desires his own place. Chris [name] had a recent visit to the ED for pain and weakness. He reports feeling better. He is unable to focus on his health due to his unhappiness with his current living situation. He is living with his brother, but he only wants a place of his own. Chris [name] reports now living on his own in his uncles house. He reports that the house needs work, but he will save the money and do the work. He reports feeling less stress now. Now he can focus on his health. He does need food benefits, states that he was denied due to "charges". He does not have transportation, but he is able to walk to a grocery store, [employer] and [employer]. Dr. Wynetta Emery ordered a new nebulizer and glucometer on 7/1 -Update- Chris [name] is aware of his upcoming appointments and has transportation. RNCM encouraged patient to go by local food pantries while out for these appointments.  Nurse Case Manager Clinical Goal(s):  patient will work with Jackelyn Poling Bailey/[facility] to address needs related to housing-Mr. Karman reports no assistance available patient will meet with RN Care Manager to address needs related to managing CHF and DMII patient will demonstrate improved health management independence as evidenced bytaking prescribed medications, reporting any changes in symptoms to PCP such as swelling, changes in breathing, new cough Patient will work with [facility] pharmacist, Chris [name] for medication management Patient will work with Jerene Pitch, LCSW for mental health-[facility] Patient will work with Ubaldo Glassing for housing insecurity-Patient currently has is own  place Interventions:  Inter-disciplinary care team collaboration (see longitudinal plan of care) Evaluation of current treatment plan related to CHF and DMII and patient's adherence to plan as established by provider. Advised patient to call and schedule an appointment with Cardiology-Dr. Clayborn Bigness [phone] verbally provided Advised patient to follow up with all scheduled appointments-PCP 02/19/21 and Heart Failure Clinic 02/25/21 Discussed going by food pantries from provided list while out for next appointments Provided number to [employer] [phone], per Jane's note, patient is to take nebulizer to be repaired or replaced Discussed plans with patient for ongoing care management follow up and provided patient with direct contact information for care management team Provided therapeutic  listening Patient Goals/Self-Care Activities - call and make an appointment with Cardiology-Dr. Clayborn Bigness [phone] - continue to work on smoking cessation - eat more whole grains, fruits and vegetables, lean meats and healthy fats -Call to fill prescriptions one week before I run out of medication -Take all medications as prescribed - call office if I gain more than 2 pounds in one day or 5 pounds in one week - use salt in moderation - watch for swelling in feet, ankles and legs every day  - eat more whole grains, fruits and vegetables, lean meats and healthy fats - know when to call the doctor - track symptoms and what helps feel better or worse - dress right for the weather, hot or cold - eat a heart healthy diet, low fat, no salt - contact provider with any concerns or questions - attend all scheduled appointments: PCP 02/19/21 and Heart Failure Clinic 02/25/21 Follow Up Plan: Telephone follow up appointment with Managed Medicaid care management team member scheduled for:03/19/21 @ 11:15am     Patient Care Plan: General Social Work (Adult)     Problem Identified: Homelessness Resolved  11/25/2020  Onset Date: 07/09/2020  Note:   Chris [name] is a 60 y.o. year old male who sees [name], MD for primary care. The  Medicaid Managed Care team was consulted for assistance with Housing barriers. Mr. Morren was given information about Care Management services, agreed to services, and verbal consent for services was obtained.  Interventions:  Patient interviewed and appropriate assessments performed Collaborated with clinical team regarding patient needs  SDOH (Social Determinants of Health) assessments performed: Yes     Provided patient with information about the [facility]. [name] contacted [facility] to complete a referral for patient to get into the homeless motel. [name] left a voicemail. [name] informed patient that the process can take a long time. Patient states he does not know why no one wants to help him. [name] informed patient he may be able to go to a shelter in [facility], patient states he does not feel safe in [place].  [name] contacted [employer] in [place] [phone] and left a voicemail for the intake coordinator. [name] contacted [facility] to speak with a Education officer, museum but kept getting transferred.  Patient states he does not have any money to get a room and become frustrated again stating that we "[place]"do not want to help him. [name] informed patient that she will wait for the [facility] and/or the [facility] to contact her back. [name] informed patient she could provide him with the information for [facility] but patient did not want to call. Update 07/22/20: Patient stated he is doing okay since his surgery but still wants somewhere else to live. Patient stated he knows of a boarding house on [address] and would like SW to help him get in it. SW researched and could not find a boarding house on [address], Patient stated he would get the name of it and contact [name] with the information. Update 10/14/20: [name] contacted patient to complete  a follow up. Patient states he is currently living with his brother but is ready to be on his own. He states he sister has found a house for him to stay but it needs a lot of work. Patient is asking for assistance with programs that help disabled with fixing up homes. Patient states the home is in [place] and needs windows and flooring done. [name] will research programs that assists with  home repairs/modeling. Patient stated he is going to the beach May 6-18. [name] will follow up with patient on May 23 at 3pm. Update 5/26: [name] received a telephone call from patient. Patient states that he is ready to move. [name] provided patient with information for programs that may be able to help with renovations to the home in [place]. [name] informed patient that since his uncle owns the home, his uncle will have to call. [employer] [phone] and the [facility] [phone]. Update: 11/25/20: [name] received a telephone call back from patient. He states he has moved but needs assistance with getting the floor fixed, a new stove , refrigerator and furniture. [name] informed patient she would see if she could locate some resources that will help with the furniture. [name] already provided resources for getting the floor fixed, patient states he has not contacted those resources yet. 12/26/20: [name] received a note that patient is in need of assistance with his utility bill. Patient states he does not have a cut off notice. Resources provide are DSS [phone], [employer] [phone], and [employer] [phone].  Plan:  Over the next 30-60 days, patient will work with [name] to address needs related to Housing barriers Education officer, museum will continue to follow up with patient.   Chris [name], [name], [place]  High Risk Managed Medicaid Team          Problem Identified: Depression Identification (Depression)      Long-Range Goal: To  increase my self-care and coping skills   Start Date: 10/07/2020  Priority: High  Note:   Timeframe:  Long-Range Goal Priority:  High Start Date:    10/07/20                        Expected End Date:ongoing    Follow Up Date- 01/28/21   - begin personal counseling - call and visit an old friend - check out volunteer opportunities - join a support group - laugh; watch a funny movie or comedian - learn and use visualization or guided imagery - perform a random act of kindness - practice relaxation or meditation daily - start or continue a personal journal - talk about feelings with a friend, family or spiritual advisor - practice positive thinking and self-talk    Why is this important?   When you are stressed, down or upset, your body reacts too.  For example, your blood pressure may get higher; you may have a headache or stomachache.  When your emotions get the best of you, your body's ability to fight off cold and flu gets weak.  These steps will help you manage your emotions.   Current Barriers:  Chronic Mental Health needs related to depression and need for housing Limited social support, Housing barriers, and Mental Health Concerns  Social Isolation ADL IADL limitations Suicidal Ideation/Homicidal Ideation: No  Clinical Social Work Goal(s):  Over the next 120 days, patient will work with SW monthly by telephone or in person to reduce or manage symptoms related to depression patient will work with [name] to address needs related to finding stable housing but patient was made aware that housing resources are very limited at this time  Interventions: Patient interviewed and appropriate assessments performed: brief mental health assessment PHQ 2 PHQ 9 SDOH Interventions    Flowsheet Row Most Recent Value  SDOH Interventions   SDOH Interventions for the Following Domains Housing  Housing Interventions Other (  Comment)  [Referral for housing support]  Depression  Interventions/Treatment  Patient refuses Treatment      Patient interviewed and appropriate assessments performed Discussed plans with patient for ongoing care management follow up and provided patient with direct contact information for care management team Assisted patient/caregiver with obtaining information about health plan benefits Patient was educated on the Advance Directives process. Patient was encouraged to complete document with PCP.  Encouraged patient to consider a mental health provider for long term follow up and therapy/counseling but patient declined. Patient reports that does not need a psychiatry referral at this time. He reports that his main source of depression is from not being able to find a place on his own. He is currently stable and residing with his brother and his brother's family in their home. Patient reports that his brother leaves for work at 3 pm during the week and gets back home around 1 am. [employer] number provided to patient. Patient reports that his sister has found him a house to relocate in. This house is inherited from the family and will need some additional work done. Patient reports that this house has mold in it but he rather reside there instead of living with his brother. Patient is currently stable and reports no urgent concerns at this time. [facility] LCSW completed care coordination with Care Guide on 01/07/21 as patient never received food delivery that was set up.  Patient reports recent agitation due to his inability to secure housing on this own. [facility] LCSW provided emotional support and brief housing resource education. Patient is currently not on any public housing wait list.   Solution-Focused Strategies, Mindfulness or Relaxation Training, Active listening / Reflection utilized , Emotional Supportive Provided, and Verbalization of feelings encouraged  Patient declines any current substance use. He reports that his last use of cocaine  was over 6 months ago. Patient reports that he has developed social anxiety. He admits that he gets irritable at times and will snap at his loved ones and then will experience guilt afterwards. Educated patient on coping methods to implement into his daily life to combat anxiety symptoms and stress. Patient denied any current suicidal or homicidal ideations. Encouraged patient to implement deep breathing and grounding exercises into his daily routine due to ongoing anxiety and SOB.  Update 01/07/21-Patient is not agreeable to mental health support or any additional referrals. [facility] LCSW placed referral for counseling and medication management through the [facility] in the past but patient declined their calls and eventually refused services.  Patient reports implementing appropriate self-care habits into his daily routine such as: drinking water, staying active around the house, taking his medications as prescribed, combating negative thoughts or emotions and staying connected with his support network. Positive reinforcement provided.   Patient Self Care Activities:  Ability for insight Independent living Strong family or social support  Patient Coping Strengths:  Self Advocate Able to Communicate Effectively  Patient Self Care Deficits:  Lacks social connections  Depression screen [facility] 2/9 10/07/2020 11/01/2019 09/11/2019 07/09/2019 04/04/2019  Decreased Interest 2 2 0 1 3  Down, Depressed, Hopeless 2 3 1 1 3   PHQ - 2 Score 4 5 1 2 6   Altered sleeping 1 3 3  0 3  Tired, decreased energy 3 2 0 1 3  Change in appetite 0 - 0 1 0  Feeling bad or failure about yourself  2 2 0 0 3  Trouble concentrating 2 2 0 0 3  Moving slowly or  fidgety/restless 0 - 0 0 3  Suicidal thoughts 0 0 0 0 0  PHQ-9 Score 12 14 4 4 21   Difficult doing work/chores Somewhat difficult - - - Extremely dIfficult     Patient Care Plan: Medication Management     Problem Identified: Health Promotion or Disease  Self-Management (General Plan of Care)      Goal: Medication Management   Note:   Current Barriers:  Does not adhere to prescribed medication regimen Does not maintain contact with provider office Does not contact provider office for questions/concerns   Pharmacist Clinical Goal(s):  Over the next 30 days, patient will contact provider office for questions/concerns as evidenced notation of same in electronic health record through collaboration with PharmD and provider.    Interventions: Inter-disciplinary care team collaboration (see longitudinal plan of care) Comprehensive medication review performed; medication list updated in electronic medical record    Patient Goals/Self-Care Activities Over the next 30 days, patient will:  - collaborate with provider on medication access solutions  Follow Up Plan: The care management team will reach out to the patient again over the next 30 days.

## 2021-02-17 NOTE — Patient Outreach (Signed)
Medicaid Managed Care   Nurse Care Manager Note  02/17/2021 Name:  Chris [name] MRN:  [id number] DOB:  [birth year]  Chris [name] is an 60 y.o. year old male who is a primary patient of [name], MD.  The [facility] Managed Care Coordination team was consulted for assistance with:    CHF DMII  Mr. Stacey was given information about Medicaid Managed Care Coordination team services today. Chris [name] Patient agreed to services and verbal consent obtained.  Engaged with patient by telephone for follow up visit in response to provider referral for case management and/or care coordination services.   Assessments/Interventions:  Review of past medical history, allergies, medications, health status, including review of consultants reports, laboratory and other test data, was performed as part of comprehensive evaluation and provision of chronic care management services.  SDOH (Social Determinants of Health) assessments and interventions performed:   Care Plan  No Known Allergies  Medications Reviewed Today     Reviewed by [name], RN (Registered Nurse) on 02/17/21 at 1144  Med List Status: <None>   Medication Order Taking? Sig Documenting Provider Last Dose Status Informant  Accu-Chek Softclix Lancets lancets [id number]  Use as instructed [name], MD  Active   albuterol (PROVENTIL) (2.5 MG/3ML) 0.083% nebulizer solution [id number]  TAKE 3 MLS BY NEBULIZATION EVERY 6 (SIX) HOURS AS NEEDED FOR SHORTNESS OF BREATH. [name], MD  Active   albuterol (VENTOLIN HFA) 108 (90 Base) MCG/ACT inhaler [id number] No INHALE 2 PUFFS INTO THE LUNGS EVERY 6 (SIX) HOURS AS NEEDED FOR WHEEZING OR SHORTNESS OF BREATH. [name], MD Taking Active            Med Note Nash Mantis, TIFFANI S   Tue Feb 10, 2021  9:21 AM) LF 8.3.2022 30DS  allopurinol (ZYLOPRIM) 100 MG tablet [id number] No TAKE 2 TABLETS (200 MG TOTAL) BY MOUTH DAILY. [name], MD Taking Active             Med Note Nash Mantis, TIFFANI S   Tue Feb 10, 2021  9:21 AM) LF 8.3.2022 30DS  apixaban (ELIQUIS) 5 MG TABS tablet [id number] No TAKE 1 TABLET (5 MG TOTAL) BY MOUTH 2 (TWO) TIMES DAILY. [name], MD Taking Active            Med Note Nash Mantis, TIFFANI S   Tue Feb 10, 2021  9:21 AM) LF 8.3.2022 30DS  aspirin 81 MG EC tablet [id number] No Take 1 tablet (81 mg total) by mouth daily. [name], MD Taking Active            Med Note Nash Mantis, TIFFANI S   Tue Feb 10, 2021  9:22 AM) LF 8.3.2022 30DS  atorvastatin (LIPITOR) 40 MG tablet [id number] No Take 1 tablet (40 mg total) by mouth daily. [name], MD Taking Active            Med Note Nash Mantis, TIFFANI S   Tue Feb 10, 2021  9:22 AM) LF 8.3.2022 30DS  Blood Glucose Monitoring Suppl (ACCU-CHEK GUIDE) w/Device [name] [id number]  Use as directed [name], MD  Active   budesonide-formoterol [facility]) 160-4.5 MCG/ACT inhaler [id number] No Inhale 2 puffs into the lungs 2 (two) times daily. [name], MD Taking Active            Med Note Nash Mantis, TIFFANI S   Tue Feb 10, 2021  9:22 AM) LF 8.3.2022 30DS  carvedilol (COREG) 6.25 MG tablet [id number]  No Take 1 tablet (6.25 mg total) by mouth 2 (two) times daily. Alisa Graff, FNP Taking Active            Med Note Nash Mantis, TIFFANI S   Tue Feb 10, 2021  9:22 AM) LF 8.3.2022 30DS  colchicine 0.6 MG tablet [id number]  Take 2 tabs (1.2 mg) at the onset of a gout flare, may repeat 1 tab (0.6 mg) after 2 hours if symptoms persist. [name], MD  Active            Med Note Nash Mantis, TIFFANI S   Tue Feb 10, 2021  9:22 AM) LF 7.6.2022 30DS  dapagliflozin propanediol (FARXIGA) 10 MG TABS tablet [id number] No Take 1 tablet (10 mg total) by mouth daily. Alisa Graff, FNP Taking Active            Med Note Nash Mantis, TIFFANI S   Tue Feb 10, 2021  9:22 AM) LF 8.3.2022 30DS  furosemide (LASIX) 40 MG tablet [id number] No Take 1 tablet (40 mg total) by mouth daily. Alisa Graff, FNP Taking Active   glipiZIDE (GLUCOTROL) 5 MG tablet [id number] No Take 0.5 tablets (2.5 mg total) by mouth 2 (two) times daily before a meal. [name], MD Taking Active            Med Note Nash Mantis, TIFFANI S   Tue Feb 10, 2021  9:23 AM) LF 8.3.2022 30DS  glucose blood (ACCU-CHEK GUIDE) test strip [id number]  Use as directed to check blood sugar 1-2 times a day [name], MD  Active   isosorbide-hydrALAZINE (BIDIL) 20-37.5 MG tablet [id number]  TAKE 1 TABLET BY MOUTH 3 (THREE) TIMES DAILY. Bonnielee Haff, MD  Active   Discontinued 07/11/20 [phone] (Discontinued by provider)   montelukast (SINGULAIR) 10 MG tablet [id number] No TAKE 1 TABLET (10 MG TOTAL) BY MOUTH AT BEDTIME. [name], MD Taking Active            Med Note Nash Mantis, TIFFANI S   Tue Feb 10, 2021  9:23 AM) LF 8.3.2022 30DS  predniSONE (DELTASONE) 20 MG tablet [id number]  Take 2 tablets (40 mg total) by mouth daily with breakfast. Bonnielee Haff, MD  Active   triamcinolone cream (KENALOG) 0.1 % [id number] No Apply 1 application topically 2 (two) times daily as needed (skin irritation on face). [name], PA-C Taking Active            Med Note Nash Mantis, TIFFANI S   Tue Feb 10, 2021  9:24 AM) LF 6.2.2022 15DS            Patient Active Problem List   Diagnosis Date Noted   Left-sided weakness 10/28/2020   COPD exacerbation (HCC)    Typical atrial flutter (HCC)    Acute respiratory failure with hypoxia ([place]) 07/05/2020   CHF (congestive heart failure) ([place]) 07/04/2020   Acute exacerbation of CHF (congestive heart failure) ([place]) 06/16/2020   COPD with acute exacerbation ([place]) 06/16/2020   Influenza vaccine refused 05/06/2020   Acute on chronic combined systolic (congestive) and diastolic (congestive) heart failure ([place]) 05/05/2020   Acute decompensated heart failure ([place]) 05/04/2020   Illiteracy 05/04/2020   Chronic respiratory failure with hypoxia, on home oxygen therapy ([place]) 12/28/2019    Type 2 diabetes mellitus with stage 3 chronic kidney disease ([place]) 12/25/2019   Acute and chronic respiratory failure (acute-on-chronic) ([place]) 12/25/2019   Acute on chronic combined systolic and diastolic CHF (congestive heart failure) ([place]) 10/26/2019  Elevated troponin I level 10/26/2019   Acute on chronic diastolic (congestive) heart failure ([place]) 10/26/2019   History of gout 02/01/2019   Seasonal allergic rhinitis due to pollen 02/01/2019   Tobacco dependence 11/30/2018   Microscopic hematuria 11/30/2018   Depression 11/30/2018   Difficulty controlling anger 11/30/2018   CAP (community acquired pneumonia) 08/11/2018   COPD (chronic obstructive pulmonary disease) (HCC)    CKD (chronic kidney disease) stage 3, GFR 30-59 ml/min ([place]) 08/10/2018   Recurrent epistaxis 04/21/2018   Mixed hyperlipidemia 07/28/2017   Essential hypertension 88/89/1694   Chronic systolic heart failure ([place]) 10/25/2014   Cocaine abuse ([place]) 02/20/2013   Cannabis abuse 02/20/2013   Back pain, chronic 02/20/2013    Conditions to be addressed/monitored per PCP order:  CHF and DMII  Care Plan : Heart Failure (Adult)  Updates made by [name], RN since 02/17/2021 12:00 AM     Problem: Symptom Exacerbation (Heart Failure)      Long-Range Goal: Symptom Exacerbation Prevented or Minimized   Start Date: 07/02/2020  Expected End Date: 03/19/2021  Recent Progress: On track  Priority: High  Note:   Current Barriers:  Chronic Disease Management, support and educational needs related to management of multiple chronic diagnosis. Mr. Antillon is managing CHF, DMII, stage III CKD and chronic respiratory failure. He missed his last appointment with his PCP and cardiologist. He reports eating right, checking his blood sugar daily and taking all of his medications. He recently moved and is living with his brother, desires his own place. Mr. Lennartz had a recent visit to the ED for pain and weakness. He reports  feeling better. He is unable to focus on his health due to his unhappiness with his current living situation. He is living with his brother, but he only wants a place of his own. Mr. Arey reports now living on his own in his uncles house. He reports that the house needs work, but he will save the money and do the work. He reports feeling less stress now. Now he can focus on his health. He does need food benefits, states that he was denied due to "charges". He does not have transportation, but he is able to walk to a grocery store, [employer] and [employer]. Dr. Wynetta Emery ordered a new nebulizer and glucometer on 7/1 -Update- Mr. Kail is aware of his upcoming appointments and has transportation. RNCM encouraged patient to go by local food pantries while out for these appointments.  Nurse Case Manager Clinical Goal(s):  patient will work with Jackelyn Poling [facility] to address needs related to housing-Mr. Baze reports no assistance available patient will meet with RN Care Manager to address needs related to managing CHF and DMII patient will demonstrate improved health management independence as evidenced bytaking prescribed medications, reporting any changes in symptoms to PCP such as swelling, changes in breathing, new cough Patient will work with [facility] pharmacist, Ovid Curd for medication management Patient will work with Jerene Pitch, LCSW for mental health-[facility] Patient will work with Ubaldo Glassing for housing insecurity-Patient currently has is own place Interventions:  Inter-disciplinary care team collaboration (see longitudinal plan of care) Evaluation of current treatment plan related to CHF and DMII and patient's adherence to plan as established by provider. Advised patient to call and schedule an appointment with Cardiology-Dr. Clayborn Bigness [phone] verbally provided Advised patient to follow up with all scheduled appointments-PCP 02/19/21 and Heart Failure Clinic  02/25/21 Discussed going by food pantries from provided list while out for next appointments Provided  number to [employer] [phone], per Jane's note, patient is to take nebulizer to be repaired or replaced Discussed plans with patient for ongoing care management follow up and provided patient with direct contact information for care management team Provided therapeutic listening Patient Goals/Self-Care Activities - call and make an appointment with Cardiology-Dr. Clayborn Bigness [phone] - continue to work on smoking cessation - eat more whole grains, fruits and vegetables, lean meats and healthy fats -Call to fill prescriptions one week before I run out of medication -Take all medications as prescribed - call office if I gain more than 2 pounds in one day or 5 pounds in one week - use salt in moderation - watch for swelling in feet, ankles and legs every day  - eat more whole grains, fruits and vegetables, lean meats and healthy fats - know when to call the doctor - track symptoms and what helps feel better or worse - dress right for the weather, hot or cold - eat a heart healthy diet, low fat, no salt - contact provider with any concerns or questions - attend all scheduled appointments: PCP 02/19/21 and Heart Failure Clinic 02/25/21 Follow Up Plan: Telephone follow up appointment with Managed Medicaid care management team member scheduled for:03/19/21 @ 11:15am      Follow Up:  Patient agrees to Care Plan and Follow-up.  Plan: The Managed Medicaid care management team will reach out to the patient again over the next 30 days.  Date/time of next scheduled RN care management/care coordination outreach:  03/19/21 @ 11:15am  [name] RN, [place] RN Care Coordinator

## 2021-02-19 ENCOUNTER — Inpatient Hospital Stay: Payer: Medicaid Other | Admitting: Internal Medicine

## 2021-02-19 ENCOUNTER — Other Ambulatory Visit: Payer: Self-pay

## 2021-02-23 ENCOUNTER — Other Ambulatory Visit: Payer: Self-pay | Admitting: Licensed Clinical Social Worker

## 2021-02-23 NOTE — Patient Outreach (Signed)
[facility]) Care Management  02/23/2021  Chris [name] 1961/03/18 [id number]  Patient successfully answered initial phone call but this call was brief and was dropped unexpectedly. [facility] LCSW attempted to make several additional attempts to reach back with patient but was unsuccessful and mailbox was full. LCSW will ask Scheduling Care Guide to reschedule [facility] SW appointment with patient.  Eula Fried, [name], MSW, [employer] Managed Medicaid LCSW [email or web address] Phone: [phone]

## 2021-02-23 NOTE — Patient Instructions (Signed)
[name] ,   The [facility] Managed Care Team is available to provide assistance to you with your healthcare needs at no cost and as a benefit of your [facility] Health plan. I'm sorry I was unable to reach you today for our scheduled appointment. Our care guide will call you to reschedule our telephone appointment. Please call me at the number below. I am available to be of assistance to you regarding your healthcare needs. .   Thank you,   Eula Fried, [name], MSW, LCSW Managed Medicaid LCSW [email or web address] Phone: [phone]
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
## 2021-02-25 ENCOUNTER — Telehealth: Payer: Self-pay | Admitting: Family

## 2021-02-25 ENCOUNTER — Ambulatory Visit: Payer: Medicaid Other | Admitting: Family

## 2021-02-25 NOTE — Telephone Encounter (Signed)
Patient did not show for his Heart Failure Clinic appointment on 02/25/21. Will attempt to reschedule.

## 2021-03-02 ENCOUNTER — Telehealth: Payer: Self-pay | Admitting: Internal Medicine

## 2021-03-02 NOTE — Telephone Encounter (Signed)
..   Medicaid Managed Care   Unsuccessful Outreach Note  03/02/2021 Name: Chris [name] MRN: [id number] DOB: 03/26/1961  Referred by: [name], MD Reason for referral : No chief complaint on file.   An unsuccessful telephone outreach was attempted today. The patient was referred to the case management team for assistance with care management and care coordination.   Follow Up Plan: The care management team will reach out to the patient again over the next 7 days.   [place]

## 2021-03-04 ENCOUNTER — Telehealth: Payer: Self-pay | Admitting: Internal Medicine

## 2021-03-04 DIAGNOSIS — Z114 Encounter for screening for human immunodeficiency virus [HIV]: Secondary | ICD-10-CM

## 2021-03-04 NOTE — Telephone Encounter (Signed)
Phone call placed to patient today.  He was identified using 2 patient identifiers.  Informed patient that the initial screening HIV test that was done in the hospital came back positive.  It looks that they did not have enough blood to run the confirmatory test.  I request that he comes to our lab to have the test repeated.  Patient expressed understanding and states he will be by the lab tomorrow to have the test done.

## 2021-03-06 ENCOUNTER — Ambulatory Visit: Payer: Medicaid Other | Attending: Internal Medicine

## 2021-03-06 ENCOUNTER — Other Ambulatory Visit: Payer: Self-pay

## 2021-03-06 DIAGNOSIS — Z114 Encounter for screening for human immunodeficiency virus [HIV]: Secondary | ICD-10-CM | POA: Diagnosis not present

## 2021-03-07 LAB — HIV ANTIBODY (ROUTINE TESTING W REFLEX): HIV Screen 4th Generation wRfx: NONREACTIVE

## 2021-03-09 ENCOUNTER — Telehealth: Payer: Self-pay

## 2021-03-09 NOTE — Telephone Encounter (Signed)
Contacted pt to go over lab results pt is aware results and DOB was confirmed.

## 2021-03-11 ENCOUNTER — Other Ambulatory Visit: Payer: Self-pay | Admitting: Licensed Clinical Social Worker

## 2021-03-11 DIAGNOSIS — F32A Depression, unspecified: Secondary | ICD-10-CM

## 2021-03-11 DIAGNOSIS — Z5941 Food insecurity: Secondary | ICD-10-CM

## 2021-03-11 NOTE — Patient Outreach (Signed)
Medicaid Managed Care Social Work Note  03/11/2021 Name:  Chris [name] MRN:  [id number] DOB:  1961-04-23  SAW PERELLI is an 60 y.o. year old male who is a primary patient of [name], MD.  The Medicaid Managed Care Coordination team was consulted for assistance with:  Mental Health Counseling and Resources  Chris [name] was given information about Medicaid Managed Care Coordination team services today. Chris [name] Patient agreed to services and verbal consent obtained.  Engaged with patient  for by telephone forfollow up visit in response to referral for case management and/or care coordination services.   Assessments/Interventions:  Review of past medical history, allergies, medications, health status, including review of consultants reports, laboratory and other test data, was performed as part of comprehensive evaluation and provision of chronic care management services.  SDOH: (Social Determinant of Health) assessments and interventions performed: SDOH Interventions    Flowsheet Row Most Recent Value  SDOH Interventions   Depression Interventions/Treatment  Referral to Psychiatry       Advanced Directives Status:  See Care Plan for related entries.  Care Plan                 No Known Allergies  Medications Reviewed Today     Reviewed by [name], RN (Registered Nurse) on 02/17/21 at 1144  Med List Status: <None>   Medication Order Taking? Sig Documenting Provider Last Dose Status Informant  Accu-Chek Softclix Lancets lancets [id number]  Use as instructed [name], MD  Active   albuterol (PROVENTIL) (2.5 MG/3ML) 0.083% nebulizer solution [id number]  TAKE 3 MLS BY NEBULIZATION EVERY 6 (SIX) HOURS AS NEEDED FOR SHORTNESS OF BREATH. [name], MD  Active   albuterol (VENTOLIN HFA) 108 (90 Base) MCG/ACT inhaler [id number] No INHALE 2 PUFFS INTO THE LUNGS EVERY 6 (SIX) HOURS AS NEEDED FOR WHEEZING OR SHORTNESS OF BREATH. [name],  MD Taking Active            Med Note Luster Landsberg, TIFFANI S   Tue Feb 10, 2021  9:21 AM) LF 8.3.2022 30DS  allopurinol (ZYLOPRIM) 100 MG tablet [id number] No TAKE 2 TABLETS (200 MG TOTAL) BY MOUTH DAILY. [name], MD Taking Active            Med Note Luster Landsberg, TIFFANI S   Tue Feb 10, 2021  9:21 AM) LF 8.3.2022 30DS  apixaban (ELIQUIS) 5 MG TABS tablet [id number] No TAKE 1 TABLET (5 MG TOTAL) BY MOUTH 2 (TWO) TIMES DAILY. [name], MD Taking Active            Med Note Luster Landsberg, TIFFANI S   Tue Feb 10, 2021  9:21 AM) LF 8.3.2022 30DS  aspirin 81 MG EC tablet [id number] No Take 1 tablet (81 mg total) by mouth daily. [name], MD Taking Active            Med Note Luster Landsberg, TIFFANI S   Tue Feb 10, 2021  9:22 AM) LF 8.3.2022 30DS  atorvastatin (LIPITOR) 40 MG tablet [id number] No Take 1 tablet (40 mg total) by mouth daily. [name], MD Taking Active            Med Note Luster Landsberg, TIFFANI S   Tue Feb 10, 2021  9:22 AM) LF 8.3.2022 30DS  Blood Glucose Monitoring Suppl (ACCU-CHEK GUIDE) w/Device Andria Rhein [id number]  Use as directed [name], MD  Active   budesonide-formoterol [facility]) 160-4.5 MCG/ACT inhaler [id number] No Inhale 2  puffs into the lungs 2 (two) times daily. [name], MD Taking Active            Med Note Luster Landsberg, TIFFANI S   Tue Feb 10, 2021  9:22 AM) LF 8.3.2022 30DS  carvedilol (COREG) 6.25 MG tablet [id number] No Take 1 tablet (6.25 mg total) by mouth 2 (two) times daily. Delma Freeze, FNP Taking Active            Med Note Luster Landsberg, TIFFANI S   Tue Feb 10, 2021  9:22 AM) LF 8.3.2022 30DS  colchicine 0.6 MG tablet [id number]  Take 2 tabs (1.2 mg) at the onset of a gout flare, may repeat 1 tab (0.6 mg) after 2 hours if symptoms persist. [name], MD  Active            Med Note Luster Landsberg, TIFFANI S   Tue Feb 10, 2021  9:22 AM) LF 7.6.2022 30DS  dapagliflozin propanediol (FARXIGA) 10 MG TABS tablet [id number] No Take 1 tablet (10 mg  total) by mouth daily. Delma Freeze, FNP Taking Active            Med Note Luster Landsberg, TIFFANI S   Tue Feb 10, 2021  9:22 AM) LF 8.3.2022 30DS  furosemide (LASIX) 40 MG tablet [id number] No Take 1 tablet (40 mg total) by mouth daily. Delma Freeze, FNP Taking Active   glipiZIDE (GLUCOTROL) 5 MG tablet [id number] No Take 0.5 tablets (2.5 mg total) by mouth 2 (two) times daily before a meal. [name], MD Taking Active            Med Note Luster Landsberg, TIFFANI S   Tue Feb 10, 2021  9:23 AM) LF 8.3.2022 30DS  glucose blood (ACCU-CHEK GUIDE) test strip [id number]  Use as directed to check blood sugar 1-2 times a day [name], MD  Active   isosorbide-hydrALAZINE (BIDIL) 20-37.5 MG tablet [id number]  TAKE 1 TABLET BY MOUTH 3 (THREE) TIMES DAILY. Osvaldo Shipper, MD  Active   Discontinued 07/11/20 [phone] (Discontinued by provider)   montelukast (SINGULAIR) 10 MG tablet [id number] No TAKE 1 TABLET (10 MG TOTAL) BY MOUTH AT BEDTIME. [name], MD Taking Active            Med Note Luster Landsberg, TIFFANI S   Tue Feb 10, 2021  9:23 AM) LF 8.3.2022 30DS  predniSONE (DELTASONE) 20 MG tablet [id number]  Take 2 tablets (40 mg total) by mouth daily with breakfast. Osvaldo Shipper, MD  Active   triamcinolone cream (KENALOG) 0.1 % [id number] No Apply 1 application topically 2 (two) times daily as needed (skin irritation on face). Anders Simmonds, PA-C Taking Active            Med Note Luster Landsberg, TIFFANI S   Tue Feb 10, 2021  9:24 AM) LF 6.2.2022 15DS            Patient Active Problem List   Diagnosis Date Noted   Left-sided weakness 10/28/2020   COPD exacerbation (HCC)    Typical atrial flutter (HCC)    Acute respiratory failure with hypoxia (HCC) 07/05/2020   CHF (congestive heart failure) (HCC) 07/04/2020   Acute exacerbation of CHF (congestive heart failure) (HCC) 06/16/2020   COPD with acute exacerbation (HCC) 06/16/2020   Influenza vaccine refused 05/06/2020   Acute on chronic combined  systolic (congestive) and diastolic (congestive) heart failure (HCC) 05/05/2020   Acute decompensated heart failure (HCC) 05/04/2020   Illiteracy 05/04/2020   Chronic respiratory failure  with hypoxia, on home oxygen therapy (HCC) 12/28/2019   Type 2 diabetes mellitus with stage 3 chronic kidney disease (HCC) 12/25/2019   Acute and chronic respiratory failure (acute-on-chronic) (HCC) 12/25/2019   Acute on chronic combined systolic and diastolic CHF (congestive heart failure) (HCC) 10/26/2019   Elevated troponin I level 10/26/2019   Acute on chronic diastolic (congestive) heart failure (HCC) 10/26/2019   History of gout 02/01/2019   Seasonal allergic rhinitis due to pollen 02/01/2019   Tobacco dependence 11/30/2018   Microscopic hematuria 11/30/2018   Depression 11/30/2018   Difficulty controlling anger 11/30/2018   CAP (community acquired pneumonia) 08/11/2018   COPD (chronic obstructive pulmonary disease) (HCC)    CKD (chronic kidney disease) stage 3, GFR 30-59 ml/min (HCC) 08/10/2018   Recurrent epistaxis 04/21/2018   Mixed hyperlipidemia 07/28/2017   Essential hypertension 07/28/2017   Chronic systolic heart failure (HCC) 10/25/2014   Cocaine abuse (HCC) 02/20/2013   Cannabis abuse 02/20/2013   Back pain, chronic 02/20/2013    Conditions to be addressed/monitored per PCP order:  Anxiety and Depression  Care Plan : General Social Work (Adult)  Updates made by Gustavus Bryant, LCSW since 03/11/2021 12:00 AM     Problem: Depression Identification (Depression)      Long-Range Goal: To increase my self-care and coping skills   Start Date: 10/07/2020  Priority: High  Note:   Timeframe:  Long-Range Goal Priority:  High Start Date:    10/07/20                        Expected End Date:ongoing    Follow Up Date- 04/06/21   - begin personal counseling - call and visit an old friend - check out volunteer opportunities - join a support group - laugh; watch a funny movie or  comedian - learn and use visualization or guided imagery - perform a random act of kindness - practice relaxation or meditation daily - start or continue a personal journal - talk about feelings with a friend, family or spiritual advisor - practice positive thinking and self-talk    Why is this important?   When you are stressed, down or upset, your body reacts too.  For example, your blood pressure may get higher; you may have a headache or stomachache.  When your emotions get the best of you, your body's ability to fight off cold and flu gets weak.  These steps will help you manage your emotions.   Current Barriers:  Chronic Mental Health needs related to depression and need for housing Limited social support, Housing barriers, and Mental Health Concerns  Social Isolation ADL IADL limitations Suicidal Ideation/Homicidal Ideation: No  Clinical Social Work Goal(s):  Over the next 120 days, patient will work with SW monthly by telephone or in person to reduce or manage symptoms related to depression patient will work with [name] to address needs related to finding stable housing but patient was made aware that housing resources are very limited at this time  Interventions: Patient interviewed and appropriate assessments performed: brief mental health assessment PHQ 2 PHQ 9 SDOH Interventions    Social Determinants of Health with Concerns   Tobacco Use: High Risk   Smoking Tobacco Use: Every Day   Smokeless Tobacco Use: Never  Physicist, medical Strain: Not on file  Food Insecurity: Food Insecurity Present   Worried About Programme researcher, broadcasting/film/video in the Last Year: Often true   Ran Out of Food in the Last Year:  Often true  Transportation Needs: Personal assistant (Medical): Yes   Lack of Transportation (Non-Medical): Yes  Physical Activity: Not on file  Stress: Not on file  Social Connections: Not on file  Intimate Partner Violence: Not on file   Alcohol Screen: Not on file  Housing: Not on file       Patient interviewed and appropriate assessments performed Discussed plans with patient for ongoing care management follow up and provided patient with direct contact information for care management team Assisted patient/caregiver with obtaining information about health plan benefits Patient was educated on the Advance Directives process. Patient was encouraged to complete document with PCP.  Encouraged patient to consider a mental health provider for long term follow up and therapy/counseling but patient declined. Patient reports that does not need a psychiatry referral at this time. He reports that his main source of depression is from not being able to find a place on his own. He is currently stable and residing with his brother and his brother's family in their home. Patient reports that his brother leaves for work at 3 pm during the week and gets back home around 1 am. [employer] number provided to patient. Patient reports that his sister has found him a house to relocate in. This house is inherited from the family and will need some additional work done. Patient reports that this house has mold in it but he rather reside there instead of living with his brother. Patient is currently stable and reports no urgent concerns at this time. UPDATE- Patient is interested in counseling and medication management at [facility]. Referral made on 03/11/21 to them.  [facility] LCSW completed care coordination with Care Guide on 01/07/21 as patient never received food delivery that was set up. Update- new referral made for Care Guide for food support on 03/11/21. Advance Directive education was provided to patient on 03/11/21. Suicide Risk Assessment completed on 03/11/21. Patient reports recent agitation due to his inability to secure housing on this own. [facility] LCSW provided emotional support and brief housing resource education. Patient is currently not  on any public housing wait list.   Solution-Focused Strategies, Mindfulness or Relaxation Training, Active listening / Reflection utilized , Emotional Supportive Provided, and Verbalization of feelings encouraged  Patient declines any current substance use. He reports that his last use of cocaine was over 6 months ago. Patient reports that he has developed social anxiety. He admits that he gets irritable at times and will snap at his loved ones and then will experience guilt afterwards. Educated patient on coping methods to implement into his daily life to combat anxiety symptoms and stress. Patient denied any current suicidal or homicidal ideations. Encouraged patient to implement deep breathing and grounding exercises into his daily routine due to ongoing anxiety and SOB.  Update 01/07/21-Patient is not agreeable to mental health support or any additional referrals. [facility] LCSW placed referral for counseling and medication management through the [employer] in the past but patient declined their calls and eventually refused services.  Patient reports implementing appropriate self-care habits into his daily routine such as: drinking water, staying active around the house, taking his medications as prescribed, combating negative thoughts or emotions and staying connected with his support network. Positive reinforcement provided.   Patient Self Care Activities:  Ability for insight Independent living Strong family or social support  Patient Coping Strengths:  Self Advocate Able to Communicate Effectively  Patient Self Care Deficits:  Lacks social  connections  Depression screen [facility] 2/9 10/07/2020 11/01/2019 09/11/2019 07/09/2019 04/04/2019  Decreased Interest 2 2 0 1 3  Down, Depressed, Hopeless 2 3 1 1 3   PHQ - 2 Score 4 5 1 2 6   Altered sleeping 1 3 3  0 3  Tired, decreased energy 3 2 0 1 3  Change in appetite 0 - 0 1 0  Feeling bad or failure about yourself  2 2 0 0 3  Trouble concentrating 2  2 0 0 3  Moving slowly or fidgety/restless 0 - 0 0 3  Suicidal thoughts 0 0 0 0 0  PHQ-9 Score 12 14 4 4 21   Difficult doing work/chores Somewhat difficult - - - Extremely dIfficult      Follow up:  Patient agrees to Care Plan and Follow-up.  Plan: The Managed Medicaid care management team will reach out to the patient again over the next 30 days.  Date of next scheduled Social Work care management/care coordination outreach:  04/06/21  Dickie La, [name], MSW, LCSW Managed Medicaid LCSW [facility]  [facility] [email or web address] Phone: [phone]

## 2021-03-11 NOTE — Patient Instructions (Signed)
Visit Information  Mr. Dauphin was given information about Medicaid Managed Care team care coordination services as a part of their Healthy [facility] Medicaid benefit. EMERSON SCHREIFELS verbally consented to engagement with the [facility] Managed Care team.   If you are experiencing a medical emergency, please call 911 or report to your local emergency department or urgent care.   If you have a non-emergency medical problem during routine business hours, please contact your provider's office and ask to speak with a nurse.   For questions related to your Healthy [facility] health plan, please call: [phone] or visit the homepage here: [email or web address]  If you would like to schedule transportation through your Healthy [facility] plan, please call the following number at least 2 days in advance of your appointment: [phone]  Call the [place] at [phone], at any time, 24 hours a day, 7 days a week. If you are in danger or need immediate medical attention call 911.  If you would like help to quit smoking, call 1-800-QUIT-NOW 901-629-1353) OR Espaol: 1-855-Djelo-Ya (8-416-606-3016) o para ms informacin haga clic aqu or Text READY to 200-400 to register via text  Chris [name] - following are the goals we discussed in your visit today:   Goals Addressed             This Visit's Progress    Manage My Emotions and Improve My Self-Care       Timeframe:  Long-Range Goal Priority:  High Start Date:    10/07/20                        Expected End Date:  ongoing                    Follow Up Date-04/06/21   - begin personal counseling - call and visit an old friend - check out volunteer opportunities - join a support group - laugh; watch a funny movie or comedian - learn and use visualization or guided imagery - perform a random act of kindness - practice relaxation or meditation daily - start or continue a personal  journal - talk about feelings with a friend, family or spiritual advisor - practice positive thinking and self-talk    Why is this important?   When you are stressed, down or upset, your body reacts too.  For example, your blood pressure may get higher; you may have a headache or stomachache.  When your emotions get the best of you, your body's ability to fight off cold and flu gets weak.  These steps will help you manage your emotions.   Current Barriers:  Chronic Mental Health needs related to depression and need for housing Limited social support, Housing barriers, and Mental Health Concerns  Social Isolation ADL IADL limitations Suicidal Ideation/Homicidal Ideation: No  Clinical Social Work Goal(s):  Over the next 120 days, patient will work with SW monthly by telephone or in person to reduce or manage symptoms related to depression patient will work with [name] to address needs related to finding stable housing but patient was made aware that housing resources are very limited at this time  Interventions: Patient interviewed and appropriate assessments performed: brief mental health assessment PHQ 2 PHQ 9 SDOH Interventions    Flowsheet Row Most Recent Value  SDOH Interventions   SDOH Interventions for the Following Domains Housing  Housing Interventions Other (Comment)  [Referral for housing support]  Depression Interventions/Treatment  Patient refuses Treatment  Patient interviewed and appropriate assessments performed Discussed plans with patient for ongoing care management follow up and provided patient with direct contact information for care management team Assisted patient/caregiver with obtaining information about health plan benefits Encouraged patient to consider a mental health provider for long term follow up and therapy/counseling but patient declined. Patient reports that does not need a psychiatry referral at this time. He reports that his main source of  depression is from not being able to find a place on his own. He is currently stable and residing with his brother and his brother's family in their home. Patient reports that his brother leaves for work at 3 pm during the week and gets back home around 1 am. [employer] number provided to patient. Patient reports that his sister has found him a house to relocate in. This house is inherited from the family and will need some additional work done. Patient reports that this house has mold in it but he rather reside there instead of living with his brother. Patient is currently stable and reports no urgent concerns at this time. [facility] LCSW completed care coordination with [facility] [name] on 10/07/20 who will follow up with patient this week. Patient reports recent agitation due to his inability to secure housing on this own. [facility] LCSW provided emotional support and brief housing resource education. Patient is currently not on any public housing wait list.   Solution-Focused Strategies, Mindfulness or Relaxation Training, Active listening / Reflection utilized , Emotional Supportive Provided, and Verbalization of feelings encouraged  Patient decline any current substance use. He reports that his last use of cocaine was over 6 months ago. Patient reports that he has developed social anxiety. He admits that he gets irritable at times and will snap at his loved ones and then will experience guilt afterwards. Educated patient on coping methods to implement into his daily life to combat anxiety symptoms and stress. Patient denied any current suicidal or homicidal ideations. Encouraged patient to implement deep breathing and grounding exercises into his daily routine due to ongoing anxiety and SOB.  Patient Self Care Activities:  Ability for insight Independent living Strong family or social support  Patient Coping Strengths:  Self Advocate Able to Communicate Effectively  Patient Self Care Deficits:   Lacks social connections  Depression screen [facility] 2/9 10/07/2020 11/01/2019 09/11/2019 07/09/2019 04/04/2019  Decreased Interest 2 2 0 1 3  Down, Depressed, Hopeless 2 3 1 1 3   PHQ - 2 Score 4 5 1 2 6   Altered sleeping 1 3 3  0 3  Tired, decreased energy 3 2 0 1 3  Change in appetite 0 - 0 1 0  Feeling bad or failure about yourself  2 2 0 0 3  Trouble concentrating 2 2 0 0 3  Moving slowly or fidgety/restless 0 - 0 0 3  Suicidal thoughts 0 0 0 0 0  PHQ-9 Score 12 14 4 4 21   Difficult doing work/chores Somewhat difficult - - - Extremely dIfficult        Eula Fried, [name], MSW, [employer] Managed Medicaid [email or web address] Phone: [phone]

## 2021-03-16 ENCOUNTER — Inpatient Hospital Stay: Payer: Medicaid Other | Admitting: Internal Medicine

## 2021-03-17 ENCOUNTER — Telehealth: Payer: Self-pay

## 2021-03-17 NOTE — Telephone Encounter (Signed)
Telephone encounter was:  Unsuccessful.  03/17/2021 Name: Chris [name] MRN: [id number] DOB: 19-Apr-1961  Unsuccessful outbound call made today to assist with:  Food Insecurity  Outreach Attempt:  1st Attempt  A HIPAA compliant voice message was left requesting a return call.  Instructed patient to call back at / 1st attempt: Outreach call for food resources. Unable to leave voicemail on mobile number as the inbox is full. Unable to reach on home phone as number is not in service.  [facility] Guide, Embedded Care Coordination [facility]  [place], [place] [address]  Main Phone: [phone]  E-mail: [email or web address]  Website: [email or web address]

## 2021-03-19 ENCOUNTER — Other Ambulatory Visit: Payer: Self-pay | Admitting: *Deleted

## 2021-03-19 ENCOUNTER — Telehealth: Payer: Self-pay

## 2021-03-19 NOTE — Patient Outreach (Signed)
Care Coordination  03/19/2021  Chris [name] 1960-07-11 [id number]   Medicaid Managed Care   Unsuccessful Outreach Note  03/19/2021 Name: Chris [name] MRN: [id number] DOB: 1960-11-12  Referred by: [name], MD Reason for referral : High Risk Managed Medicaid (Unsuccessful RNCM follow up outreach)   An unsuccessful telephone outreach was attempted today. The patient was referred to the case management team for assistance with care management and care coordination.   Follow Up Plan: The care management team will reach out to the patient again over the next 14 days.   [name] RN, BSN   [employer] manager

## 2021-03-19 NOTE — Patient Instructions (Signed)
Visit Information  [name]  - as a part of your Medicaid benefit, you are eligible for care management and care coordination services at no cost or copay. I was unable to reach you by phone today but would be happy to help you with your health related needs. Please feel free to call me @ [phone].   A member of the Managed Medicaid care management team will reach out to you again over the next 14 days.   [name] RN, BSN   [employer] manager

## 2021-03-19 NOTE — Telephone Encounter (Signed)
Telephone encounter was:  Unsuccessful.  03/19/2021 Name: Chris [name] MRN: [id number] DOB: 1961-04-03  Unsuccessful outbound call made today to assist with:  Food Insecurity  Outreach Attempt:  2nd Attempt  A HIPAA compliant voice message was left requesting a return call.  Instructed patient to call back at [phone] at their earliest convenience.  [facility] Guide, Embedded Care Coordination [facility]  [place], [place] [address]  Main Phone: [phone]  E-mail: [email or web address]  Website: [email or web address]

## 2021-03-20 ENCOUNTER — Telehealth: Payer: Self-pay

## 2021-03-20 NOTE — Telephone Encounter (Signed)
Telephone encounter was:  Successful.  03/20/2021 Name: Chris [name] MRN: [id number] DOB: 1960/07/18  Chris [name] is a 60 y.o. year old male who is a primary care patient of [name], MD . The community resource team was consulted for assistance with Food Insecurity  Care guide performed the following interventions:  Patient is more so looking for food pantries that provide meats. He advised he does not qualify for food stamps. Patient would like all information to be sent out in mail.  Follow Up Plan:  Care guide will follow up with patient by phone over the next few days.  [facility] Guide, Embedded Care Coordination [facility]  [place], [place] [address]  Main Phone: [phone]  E-mail: [email or web address]  Website: [email or web address]

## 2021-04-01 ENCOUNTER — Telehealth: Payer: Self-pay

## 2021-04-01 NOTE — Telephone Encounter (Signed)
Telephone encounter was:  Unsuccessful.  04/01/2021 Name: Chris [name] [name] DOB: 10-Sep-1960  Unsuccessful outbound call made today to assist with:  Food Insecurity  Outreach Attempt:  1st Attempt  A HIPAA compliant voice message was left requesting a return call.  Instructed patient to call back at [phone] at their earliest convenience.  [facility] Guide, Embedded Care Coordination [facility]  [place], [place] [address]  Main Phone: [phone]  E-mail: [email or web address]  Website: [email or web address]

## 2021-04-03 ENCOUNTER — Telehealth: Payer: Self-pay

## 2021-04-03 NOTE — Telephone Encounter (Signed)
Telephone encounter was:  Unsuccessful.  04/03/2021 Name: Chris [name] MRN: [id number] DOB: 1960/08/19  Unsuccessful outbound call made today to assist with:  Food Insecurity  Outreach Attempt:  2nd Attempt  A HIPAA compliant voice message was left requesting a return call.  Instructed patient to call back at [phone] at their earliest convenience.  [facility] Guide, Embedded Care Coordination [facility]  [place], [place] [address]  Main Phone: [phone]  E-mail: [email or web address]  Website: [email or web address]

## 2021-04-06 ENCOUNTER — Telehealth: Payer: Self-pay

## 2021-04-06 NOTE — Telephone Encounter (Signed)
Telephone encounter was:  Unsuccessful.  04/06/2021 Name: Chris [name] MRN: [id number] DOB: 07-Aug-1960  Unsuccessful outbound call made today to assist with:  Food Insecurity  Outreach Attempt:  3rd Attempt.  Referral closed unable to contact patient. - Cell phone quickly disconnects before a dial tone. I also called home phone and it has been constantly busy. Mail for food pantries was sent out to patient already.  A HIPAA compliant voice message was left requesting a return call.  Instructed patient to call back at [phone] at their earliest convenience.   [facility] Guide, Embedded Care Coordination [facility]  [place], [place] [address]  Main Phone: [phone]  E-mail: [email or web address]  Website: [email or web address]

## 2021-04-07 ENCOUNTER — Other Ambulatory Visit: Payer: Self-pay | Admitting: *Deleted

## 2021-04-07 NOTE — Patient Instructions (Signed)
Visit Information  Mr. VALENTINO SAAVEDRA  - as a part of your Medicaid benefit, you are eligible for care management and care coordination services at no cost or copay. I was unable to reach you by phone today but would be happy to help you with your health related needs. Please feel free to call me @ [phone].   A member of the Managed Medicaid care management team will reach out to you again over the next 14 days.   [name] RN, BSN   [employer] manager

## 2021-04-07 NOTE — Patient Outreach (Signed)
Care Coordination  04/07/2021  Chris [name] August 15, 1960 [id number]   Medicaid Managed Care   Unsuccessful Outreach Note  04/07/2021 Name: Chris [name] MRN: [id number] DOB: 1961-04-24  Referred by: [name], MD Reason for referral : High Risk Managed Medicaid (Unsuccessful RNCM follow up outreach)   A second unsuccessful telephone outreach was attempted today. The patient was referred to the case management team for assistance with care management and care coordination.   Follow Up Plan: The care management team will reach out to the patient again over the next 14 days.   [name] RN, BSN [place]  [employer] manager

## 2021-04-10 IMAGING — DX DG CHEST 1V
1 series · 1 of 1 positions shown · non-contrast
Comparison: 05/15/2020

CLINICAL DATA: Chest pain and short of breath

EXAM:
CHEST  1 VIEW

[x chest ap]
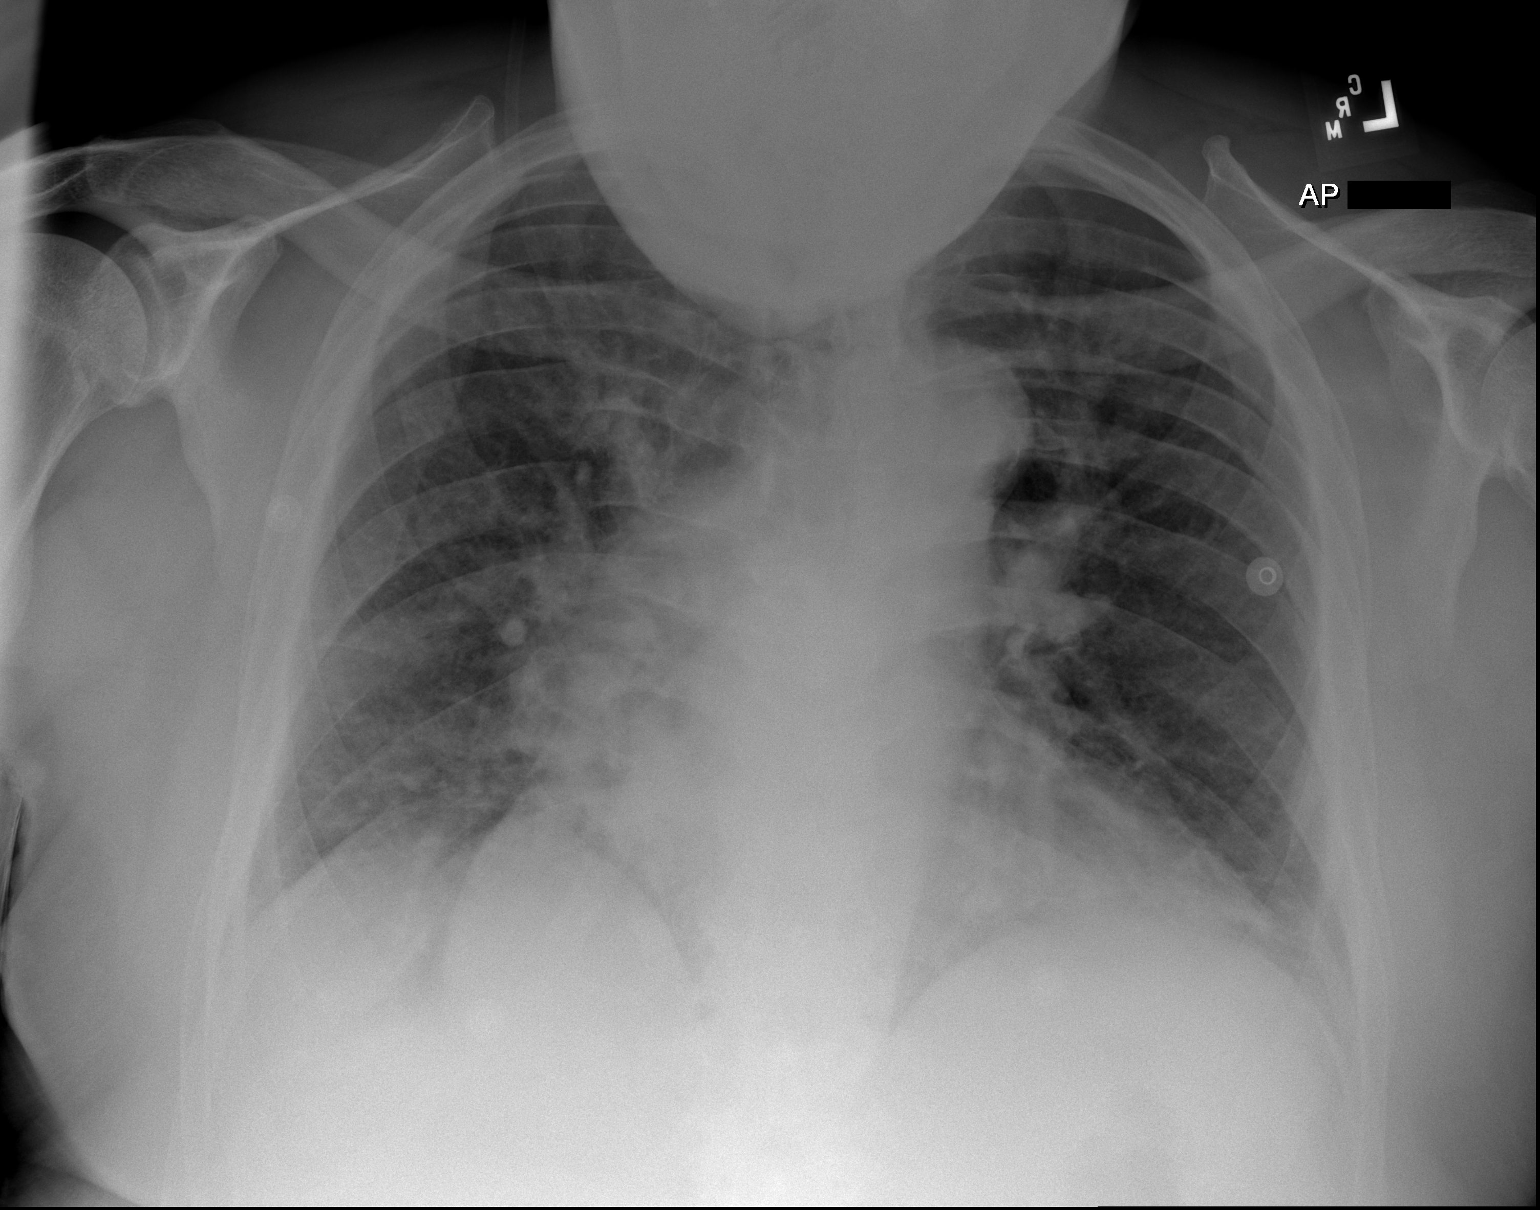

[1 of 1 positions shown; findings below may reference images not displayed]

FINDINGS: Cardiac enlargement without heart failure or edema.

Hypoventilation with bibasilar atelectasis/infiltrate. No
significant effusion.
IMPRESSION: Hypoventilation with bibasilar atelectasis/infiltrate.

## 2021-04-13 ENCOUNTER — Inpatient Hospital Stay (HOSPITAL_COMMUNITY)
Admission: EM | Admit: 2021-04-13 | Discharge: 2021-04-16 | DRG: 291 | Disposition: A | Discharge status: Home / Self Care | Attending: Internal Medicine | Admitting: Internal Medicine

## 2021-04-13 ENCOUNTER — Other Ambulatory Visit: Payer: Self-pay

## 2021-04-13 ENCOUNTER — Emergency Department (HOSPITAL_COMMUNITY)

## 2021-04-13 DIAGNOSIS — N1832 Chronic kidney disease, stage 3b: Secondary | ICD-10-CM | POA: Diagnosis present

## 2021-04-13 DIAGNOSIS — Z20822 Contact with and (suspected) exposure to covid-19: Secondary | ICD-10-CM | POA: Diagnosis present

## 2021-04-13 DIAGNOSIS — N183 Chronic kidney disease, stage 3 unspecified: Secondary | ICD-10-CM | POA: Diagnosis present

## 2021-04-13 DIAGNOSIS — Z9114 Patient's other noncompliance with medication regimen: Secondary | ICD-10-CM

## 2021-04-13 DIAGNOSIS — F1210 Cannabis abuse, uncomplicated: Secondary | ICD-10-CM | POA: Diagnosis present

## 2021-04-13 DIAGNOSIS — K219 Gastro-esophageal reflux disease without esophagitis: Secondary | ICD-10-CM | POA: Diagnosis present

## 2021-04-13 DIAGNOSIS — Z8249 Family history of ischemic heart disease and other diseases of the circulatory system: Secondary | ICD-10-CM

## 2021-04-13 DIAGNOSIS — I130 Hypertensive heart and chronic kidney disease with heart failure and stage 1 through stage 4 chronic kidney disease, or unspecified chronic kidney disease: Principal | ICD-10-CM | POA: Diagnosis present

## 2021-04-13 DIAGNOSIS — I517 Cardiomegaly: Secondary | ICD-10-CM | POA: Diagnosis not present

## 2021-04-13 DIAGNOSIS — Z7951 Long term (current) use of inhaled steroids: Secondary | ICD-10-CM

## 2021-04-13 DIAGNOSIS — R55 Syncope and collapse: Secondary | ICD-10-CM

## 2021-04-13 DIAGNOSIS — I160 Hypertensive urgency: Secondary | ICD-10-CM | POA: Diagnosis present

## 2021-04-13 DIAGNOSIS — R0902 Hypoxemia: Secondary | ICD-10-CM | POA: Diagnosis not present

## 2021-04-13 DIAGNOSIS — R0789 Other chest pain: Secondary | ICD-10-CM | POA: Diagnosis not present

## 2021-04-13 DIAGNOSIS — G4489 Other headache syndrome: Secondary | ICD-10-CM | POA: Diagnosis not present

## 2021-04-13 DIAGNOSIS — M109 Gout, unspecified: Secondary | ICD-10-CM | POA: Diagnosis present

## 2021-04-13 DIAGNOSIS — I5033 Acute on chronic diastolic (congestive) heart failure: Secondary | ICD-10-CM | POA: Diagnosis present

## 2021-04-13 DIAGNOSIS — F32A Depression, unspecified: Secondary | ICD-10-CM | POA: Diagnosis present

## 2021-04-13 DIAGNOSIS — I110 Hypertensive heart disease with heart failure: Secondary | ICD-10-CM | POA: Diagnosis not present

## 2021-04-13 DIAGNOSIS — Z7984 Long term (current) use of oral hypoglycemic drugs: Secondary | ICD-10-CM

## 2021-04-13 DIAGNOSIS — E669 Obesity, unspecified: Secondary | ICD-10-CM | POA: Diagnosis present

## 2021-04-13 DIAGNOSIS — Z79899 Other long term (current) drug therapy: Secondary | ICD-10-CM

## 2021-04-13 DIAGNOSIS — I509 Heart failure, unspecified: Secondary | ICD-10-CM | POA: Diagnosis not present

## 2021-04-13 DIAGNOSIS — I2510 Atherosclerotic heart disease of native coronary artery without angina pectoris: Secondary | ICD-10-CM | POA: Diagnosis present

## 2021-04-13 DIAGNOSIS — I480 Paroxysmal atrial fibrillation: Secondary | ICD-10-CM | POA: Diagnosis present

## 2021-04-13 DIAGNOSIS — J9811 Atelectasis: Secondary | ICD-10-CM | POA: Diagnosis present

## 2021-04-13 DIAGNOSIS — F1410 Cocaine abuse, uncomplicated: Secondary | ICD-10-CM | POA: Diagnosis present

## 2021-04-13 DIAGNOSIS — E1122 Type 2 diabetes mellitus with diabetic chronic kidney disease: Secondary | ICD-10-CM | POA: Diagnosis present

## 2021-04-13 DIAGNOSIS — Z7982 Long term (current) use of aspirin: Secondary | ICD-10-CM

## 2021-04-13 DIAGNOSIS — R52 Pain, unspecified: Secondary | ICD-10-CM

## 2021-04-13 DIAGNOSIS — R079 Chest pain, unspecified: Secondary | ICD-10-CM

## 2021-04-13 DIAGNOSIS — Z6836 Body mass index (BMI) 36.0-36.9, adult: Secondary | ICD-10-CM

## 2021-04-13 DIAGNOSIS — I444 Left anterior fascicular block: Secondary | ICD-10-CM | POA: Diagnosis present

## 2021-04-13 DIAGNOSIS — J9601 Acute respiratory failure with hypoxia: Secondary | ICD-10-CM | POA: Diagnosis present

## 2021-04-13 DIAGNOSIS — Z833 Family history of diabetes mellitus: Secondary | ICD-10-CM

## 2021-04-13 DIAGNOSIS — J441 Chronic obstructive pulmonary disease with (acute) exacerbation: Secondary | ICD-10-CM | POA: Diagnosis present

## 2021-04-13 DIAGNOSIS — E785 Hyperlipidemia, unspecified: Secondary | ICD-10-CM | POA: Diagnosis present

## 2021-04-13 DIAGNOSIS — Z7901 Long term (current) use of anticoagulants: Secondary | ICD-10-CM

## 2021-04-13 DIAGNOSIS — F17210 Nicotine dependence, cigarettes, uncomplicated: Secondary | ICD-10-CM | POA: Diagnosis present

## 2021-04-13 LAB — BASIC METABOLIC PANEL
Anion gap: 9 (ref 5–15)
BUN: 26 mg/dL — ABNORMAL HIGH (ref 6–20)
CO2: 23 mmol/L (ref 22–32)
Calcium: 9.2 mg/dL (ref 8.9–10.3)
Chloride: 105 mmol/L (ref 98–111)
Creatinine, Ser: 1.8 mg/dL — ABNORMAL HIGH (ref 0.61–1.24)
GFR, Estimated: 43 mL/min — ABNORMAL LOW (ref >60)
Glucose, Bld: 82 mg/dL (ref 70–99)
Potassium: 4.2 mmol/L (ref 3.5–5.1)
Sodium: 137 mmol/L (ref 135–145)

## 2021-04-13 LAB — CBC
HCT: 47.3 % (ref 39.0–52.0)
Hemoglobin: 15 g/dL (ref 13.0–17.0)
MCH: 29.5 pg (ref 26.0–34.0)
MCHC: 31.7 g/dL (ref 30.0–36.0)
MCV: 92.9 fL (ref 80.0–100.0)
Platelets: 176 10*3/uL (ref 150–400)
RBC: 5.09 MIL/uL (ref 4.22–5.81)
RDW: 15.2 % (ref 11.5–15.5)
WBC: 10.1 10*3/uL (ref 4.0–10.5)
nRBC: 0 % (ref 0.0–0.2)

## 2021-04-13 LAB — TROPONIN I (HIGH SENSITIVITY)
Troponin I (High Sensitivity): 40 ng/L — ABNORMAL HIGH (ref <18)
Troponin I (High Sensitivity): 46 ng/L — ABNORMAL HIGH (ref <18)

## 2021-04-13 NOTE — ED Triage Notes (Signed)
Pt from jail via EMS for eval of chest pain x 3-4 days. Also reports multiple syncopal episodes over the last two days, standing up and falling to the ground. 1 nitro and 324 ASA given by EMS without relief.

## 2021-04-14 ENCOUNTER — Encounter (HOSPITAL_COMMUNITY): Payer: Self-pay | Admitting: Internal Medicine

## 2021-04-14 ENCOUNTER — Other Ambulatory Visit: Payer: Self-pay

## 2021-04-14 ENCOUNTER — Inpatient Hospital Stay (HOSPITAL_COMMUNITY)

## 2021-04-14 DIAGNOSIS — I517 Cardiomegaly: Secondary | ICD-10-CM | POA: Diagnosis not present

## 2021-04-14 DIAGNOSIS — I509 Heart failure, unspecified: Secondary | ICD-10-CM

## 2021-04-14 DIAGNOSIS — I5033 Acute on chronic diastolic (congestive) heart failure: Secondary | ICD-10-CM | POA: Diagnosis present

## 2021-04-14 DIAGNOSIS — Z8249 Family history of ischemic heart disease and other diseases of the circulatory system: Secondary | ICD-10-CM | POA: Diagnosis not present

## 2021-04-14 DIAGNOSIS — F32A Depression, unspecified: Secondary | ICD-10-CM | POA: Diagnosis present

## 2021-04-14 DIAGNOSIS — J9811 Atelectasis: Secondary | ICD-10-CM | POA: Diagnosis not present

## 2021-04-14 DIAGNOSIS — Z20822 Contact with and (suspected) exposure to covid-19: Secondary | ICD-10-CM | POA: Diagnosis present

## 2021-04-14 DIAGNOSIS — J9601 Acute respiratory failure with hypoxia: Secondary | ICD-10-CM | POA: Diagnosis present

## 2021-04-14 DIAGNOSIS — R079 Chest pain, unspecified: Secondary | ICD-10-CM | POA: Diagnosis not present

## 2021-04-14 DIAGNOSIS — Z6836 Body mass index (BMI) 36.0-36.9, adult: Secondary | ICD-10-CM | POA: Diagnosis not present

## 2021-04-14 DIAGNOSIS — R55 Syncope and collapse: Secondary | ICD-10-CM

## 2021-04-14 DIAGNOSIS — I444 Left anterior fascicular block: Secondary | ICD-10-CM | POA: Diagnosis present

## 2021-04-14 DIAGNOSIS — I160 Hypertensive urgency: Secondary | ICD-10-CM | POA: Diagnosis present

## 2021-04-14 DIAGNOSIS — I130 Hypertensive heart and chronic kidney disease with heart failure and stage 1 through stage 4 chronic kidney disease, or unspecified chronic kidney disease: Secondary | ICD-10-CM | POA: Diagnosis present

## 2021-04-14 DIAGNOSIS — I2510 Atherosclerotic heart disease of native coronary artery without angina pectoris: Secondary | ICD-10-CM | POA: Diagnosis present

## 2021-04-14 DIAGNOSIS — Z79899 Other long term (current) drug therapy: Secondary | ICD-10-CM | POA: Diagnosis not present

## 2021-04-14 DIAGNOSIS — I5043 Acute on chronic combined systolic (congestive) and diastolic (congestive) heart failure: Secondary | ICD-10-CM | POA: Diagnosis not present

## 2021-04-14 DIAGNOSIS — F17210 Nicotine dependence, cigarettes, uncomplicated: Secondary | ICD-10-CM | POA: Diagnosis present

## 2021-04-14 DIAGNOSIS — R7989 Other specified abnormal findings of blood chemistry: Secondary | ICD-10-CM | POA: Diagnosis not present

## 2021-04-14 DIAGNOSIS — F1410 Cocaine abuse, uncomplicated: Secondary | ICD-10-CM | POA: Diagnosis present

## 2021-04-14 DIAGNOSIS — F1210 Cannabis abuse, uncomplicated: Secondary | ICD-10-CM | POA: Diagnosis present

## 2021-04-14 DIAGNOSIS — I480 Paroxysmal atrial fibrillation: Secondary | ICD-10-CM | POA: Diagnosis present

## 2021-04-14 DIAGNOSIS — M109 Gout, unspecified: Secondary | ICD-10-CM | POA: Diagnosis present

## 2021-04-14 DIAGNOSIS — N1832 Chronic kidney disease, stage 3b: Secondary | ICD-10-CM | POA: Diagnosis present

## 2021-04-14 DIAGNOSIS — J441 Chronic obstructive pulmonary disease with (acute) exacerbation: Secondary | ICD-10-CM | POA: Diagnosis present

## 2021-04-14 DIAGNOSIS — E669 Obesity, unspecified: Secondary | ICD-10-CM | POA: Diagnosis present

## 2021-04-14 DIAGNOSIS — E785 Hyperlipidemia, unspecified: Secondary | ICD-10-CM | POA: Diagnosis present

## 2021-04-14 DIAGNOSIS — E1122 Type 2 diabetes mellitus with diabetic chronic kidney disease: Secondary | ICD-10-CM | POA: Diagnosis present

## 2021-04-14 DIAGNOSIS — Z833 Family history of diabetes mellitus: Secondary | ICD-10-CM | POA: Diagnosis not present

## 2021-04-14 LAB — BASIC METABOLIC PANEL
Anion gap: 10 (ref 5–15)
BUN: 22 mg/dL — ABNORMAL HIGH (ref 6–20)
CO2: 24 mmol/L (ref 22–32)
Calcium: 9.2 mg/dL (ref 8.9–10.3)
Chloride: 99 mmol/L (ref 98–111)
Creatinine, Ser: 2.01 mg/dL — ABNORMAL HIGH (ref 0.61–1.24)
GFR, Estimated: 38 mL/min — ABNORMAL LOW (ref >60)
Glucose, Bld: 290 mg/dL — ABNORMAL HIGH (ref 70–99)
Potassium: 4.3 mmol/L (ref 3.5–5.1)
Sodium: 133 mmol/L — ABNORMAL LOW (ref 135–145)

## 2021-04-14 LAB — URINALYSIS, ROUTINE W REFLEX MICROSCOPIC
Bacteria, UA: NONE SEEN
Bilirubin Urine: NEGATIVE
Glucose, UA: 150 mg/dL — AB
Ketones, ur: NEGATIVE mg/dL
Leukocytes,Ua: NEGATIVE
Nitrite: NEGATIVE
Protein, ur: NEGATIVE mg/dL
Specific Gravity, Urine: 1.004 — ABNORMAL LOW (ref 1.005–1.030)
pH: 6 (ref 5.0–8.0)

## 2021-04-14 LAB — ECHOCARDIOGRAM COMPLETE
AR max vel: 2.99 cm2
AV Peak grad: 7.3 mmHg
Ao pk vel: 1.35 m/s
Area-P 1/2: 3.89 cm2
Calc EF: 38.8 %
S' Lateral: 5.03 cm
Single Plane A2C EF: 38.8 %
Single Plane A4C EF: 38.2 %

## 2021-04-14 LAB — RAPID URINE DRUG SCREEN, HOSP PERFORMED
Amphetamines: NOT DETECTED
Barbiturates: NOT DETECTED
Benzodiazepines: NOT DETECTED
Cocaine: NOT DETECTED
Opiates: NOT DETECTED
Tetrahydrocannabinol: NOT DETECTED

## 2021-04-14 LAB — CBG MONITORING, ED
Glucose-Capillary: 285 mg/dL — ABNORMAL HIGH (ref 70–99)
Glucose-Capillary: 198 mg/dL — ABNORMAL HIGH (ref 70–99)
Glucose-Capillary: 236 mg/dL — ABNORMAL HIGH (ref 70–99)

## 2021-04-14 LAB — RESP PANEL BY RT-PCR (FLU A&B, COVID) ARPGX2
Influenza A by PCR: NEGATIVE
Influenza B by PCR: NEGATIVE
SARS Coronavirus 2 by RT PCR: NEGATIVE

## 2021-04-14 LAB — BRAIN NATRIURETIC PEPTIDE: B Natriuretic Peptide: 210 pg/mL — ABNORMAL HIGH (ref 0.0–100.0)

## 2021-04-14 LAB — D-DIMER, QUANTITATIVE: D-Dimer, Quant: 0.73 ug/mL-FEU — ABNORMAL HIGH (ref 0.00–0.50)

## 2021-04-14 LAB — GLUCOSE, CAPILLARY
Glucose-Capillary: 197 mg/dL — ABNORMAL HIGH (ref 70–99)
Glucose-Capillary: 186 mg/dL — ABNORMAL HIGH (ref 70–99)

## 2021-04-14 LAB — HEPARIN LEVEL (UNFRACTIONATED): Heparin Unfractionated: 0.23 IU/mL — ABNORMAL LOW (ref 0.30–0.70)

## 2021-04-14 MED ORDER — MONTELUKAST SODIUM 10 MG PO TABS
10.0000 mg | ORAL_TABLET | Freq: Every day | ORAL | Status: DC
  Administered 2021-04-14 – 2021-04-15 (×2): 10 mg via ORAL
  Filled 2021-04-14 (×2): qty 1

## 2021-04-14 MED ORDER — ALBUTEROL SULFATE (2.5 MG/3ML) 0.083% IN NEBU
5.0000 mg | INHALATION_SOLUTION | Freq: Once | RESPIRATORY_TRACT | Status: AC
  Administered 2021-04-14: 5 mg via RESPIRATORY_TRACT
  Filled 2021-04-14: qty 6

## 2021-04-14 MED ORDER — ENOXAPARIN SODIUM 120 MG/0.8ML IJ SOSY
1.0000 mg/kg | PREFILLED_SYRINGE | Freq: Two times a day (BID) | INTRAMUSCULAR | Status: DC
  Administered 2021-04-14 – 2021-04-15 (×2): 120 mg via SUBCUTANEOUS
  Filled 2021-04-14 (×2): qty 0.8

## 2021-04-14 MED ORDER — FUROSEMIDE 40 MG PO TABS: 40.0000 mg | ORAL_TABLET | Freq: Every day | ORAL | Status: DC

## 2021-04-14 MED ORDER — METHYLPREDNISOLONE SODIUM SUCC 125 MG IJ SOLR
125.0000 mg | Freq: Once | INTRAMUSCULAR | Status: AC
  Administered 2021-04-14: 125 mg via INTRAVENOUS
  Filled 2021-04-14: qty 2

## 2021-04-14 MED ORDER — TECHNETIUM TO 99M ALBUMIN AGGREGATED
4.4000 | Freq: Once | INTRAVENOUS | Status: AC | PRN
  Administered 2021-04-14: 4.4 via INTRAVENOUS

## 2021-04-14 MED ORDER — NITROGLYCERIN 2 % TD OINT
1.0000 [in_us] | TOPICAL_OINTMENT | Freq: Once | TRANSDERMAL | Status: AC
  Administered 2021-04-14: 1 [in_us] via TOPICAL
  Filled 2021-04-14: qty 1

## 2021-04-14 MED ORDER — DOXYCYCLINE HYCLATE 100 MG PO TABS
100.0000 mg | ORAL_TABLET | Freq: Two times a day (BID) | ORAL | Status: DC
  Administered 2021-04-14 – 2021-04-15 (×3): 100 mg via ORAL
  Filled 2021-04-14 (×3): qty 1

## 2021-04-14 MED ORDER — INSULIN ASPART 100 UNIT/ML IJ SOLN
0.0000 [IU] | Freq: Every day | INTRAMUSCULAR | Status: DC
Start: 2021-04-14 — End: 2021-04-16
  Administered 2021-04-15: 2 [IU] via SUBCUTANEOUS

## 2021-04-14 MED ORDER — BUDESONIDE 0.25 MG/2ML IN SUSP
0.2500 mg | Freq: Two times a day (BID) | RESPIRATORY_TRACT | Status: DC
  Administered 2021-04-14 – 2021-04-16 (×5): 0.25 mg via RESPIRATORY_TRACT
  Filled 2021-04-14 (×6): qty 2

## 2021-04-14 MED ORDER — METHYLPREDNISOLONE SODIUM SUCC 125 MG IJ SOLR
120.0000 mg | INTRAMUSCULAR | Status: DC
  Administered 2021-04-14: 120 mg via INTRAVENOUS
  Filled 2021-04-14: qty 2

## 2021-04-14 MED ORDER — ACETAMINOPHEN 650 MG RE SUPP: 650.0000 mg | Freq: Four times a day (QID) | RECTAL | Status: DC | PRN

## 2021-04-14 MED ORDER — IPRATROPIUM BROMIDE 0.02 % IN SOLN
0.5000 mg | Freq: Once | RESPIRATORY_TRACT | Status: AC
  Administered 2021-04-14: 0.5 mg via RESPIRATORY_TRACT
  Filled 2021-04-14: qty 2.5

## 2021-04-14 MED ORDER — CARVEDILOL 6.25 MG PO TABS
6.2500 mg | ORAL_TABLET | Freq: Two times a day (BID) | ORAL | Status: DC
  Administered 2021-04-14 – 2021-04-16 (×5): 6.25 mg via ORAL
  Filled 2021-04-14 (×5): qty 1

## 2021-04-14 MED ORDER — FUROSEMIDE 10 MG/ML IJ SOLN
40.0000 mg | Freq: Two times a day (BID) | INTRAMUSCULAR | Status: DC
  Administered 2021-04-14: 40 mg via INTRAVENOUS
  Filled 2021-04-14: qty 4

## 2021-04-14 MED ORDER — DAPAGLIFLOZIN PROPANEDIOL 10 MG PO TABS: 10.0000 mg | ORAL_TABLET | Freq: Every day | ORAL | Status: DC

## 2021-04-14 MED ORDER — FUROSEMIDE 40 MG PO TABS
40.0000 mg | ORAL_TABLET | Freq: Every day | ORAL | Status: DC
  Administered 2021-04-15 – 2021-04-16 (×2): 40 mg via ORAL
  Filled 2021-04-14 (×3): qty 1

## 2021-04-14 MED ORDER — APIXABAN 5 MG PO TABS: 5.0000 mg | ORAL_TABLET | Freq: Two times a day (BID) | ORAL | Status: DC

## 2021-04-14 MED ORDER — ACETAMINOPHEN 325 MG PO TABS
650.0000 mg | ORAL_TABLET | Freq: Four times a day (QID) | ORAL | Status: DC | PRN
  Administered 2021-04-14: 650 mg via ORAL
  Filled 2021-04-14: qty 2

## 2021-04-14 MED ORDER — ALBUTEROL SULFATE (2.5 MG/3ML) 0.083% IN NEBU: 2.5000 mg | INHALATION_SOLUTION | RESPIRATORY_TRACT | Status: DC | PRN

## 2021-04-14 MED ORDER — ALLOPURINOL 100 MG PO TABS
200.0000 mg | ORAL_TABLET | Freq: Every day | ORAL | Status: DC
  Administered 2021-04-14 – 2021-04-16 (×3): 200 mg via ORAL
  Filled 2021-04-14 (×3): qty 2

## 2021-04-14 MED ORDER — ATORVASTATIN CALCIUM 40 MG PO TABS
40.0000 mg | ORAL_TABLET | Freq: Every day | ORAL | Status: DC
  Administered 2021-04-14 – 2021-04-16 (×3): 40 mg via ORAL
  Filled 2021-04-14 (×2): qty 1

## 2021-04-14 MED ORDER — OXYCODONE HCL 5 MG PO TABS
5.0000 mg | ORAL_TABLET | Freq: Four times a day (QID) | ORAL | Status: DC | PRN
  Administered 2021-04-14: 5 mg via ORAL
  Filled 2021-04-14: qty 1

## 2021-04-14 MED ORDER — INSULIN ASPART 100 UNIT/ML IJ SOLN
0.0000 [IU] | Freq: Three times a day (TID) | INTRAMUSCULAR | Status: DC
  Administered 2021-04-14: 2 [IU] via SUBCUTANEOUS
  Administered 2021-04-14: 3 [IU] via SUBCUTANEOUS
  Administered 2021-04-14 – 2021-04-15 (×2): 2 [IU] via SUBCUTANEOUS
  Administered 2021-04-15: 1 [IU] via SUBCUTANEOUS
  Administered 2021-04-15: 5 [IU] via SUBCUTANEOUS
  Administered 2021-04-16 (×2): 1 [IU] via SUBCUTANEOUS

## 2021-04-14 MED ORDER — METHYLPREDNISOLONE SODIUM SUCC 125 MG IJ SOLR
60.0000 mg | Freq: Two times a day (BID) | INTRAMUSCULAR | Status: DC
Start: 2021-04-15 — End: 2021-04-14

## 2021-04-14 MED ORDER — FUROSEMIDE 10 MG/ML IJ SOLN
40.0000 mg | Freq: Once | INTRAMUSCULAR | Status: AC
  Administered 2021-04-14: 40 mg via INTRAVENOUS
  Filled 2021-04-14: qty 4

## 2021-04-14 MED ORDER — ALBUTEROL SULFATE (2.5 MG/3ML) 0.083% IN NEBU
INHALATION_SOLUTION | RESPIRATORY_TRACT | Status: AC
  Filled 2021-04-14: qty 6

## 2021-04-14 MED ORDER — METHYLPREDNISOLONE SODIUM SUCC 125 MG IJ SOLR
125.0000 mg | Freq: Every day | INTRAMUSCULAR | Status: DC
  Administered 2021-04-15: 125 mg via INTRAVENOUS
  Filled 2021-04-14: qty 2

## 2021-04-14 MED ORDER — ENOXAPARIN SODIUM 150 MG/ML IJ SOSY
125.0000 mg | PREFILLED_SYRINGE | Freq: Once | INTRAMUSCULAR | Status: DC
  Filled 2021-04-14: qty 0.84

## 2021-04-14 MED ORDER — HYDRALAZINE HCL 20 MG/ML IJ SOLN
10.0000 mg | Freq: Four times a day (QID) | INTRAMUSCULAR | Status: DC | PRN
  Administered 2021-04-15: 10 mg via INTRAVENOUS
  Filled 2021-04-14: qty 1

## 2021-04-14 MED ORDER — IPRATROPIUM BROMIDE 0.02 % IN SOLN
RESPIRATORY_TRACT | Status: AC
  Filled 2021-04-14: qty 2.5

## 2021-04-14 MED ORDER — IPRATROPIUM-ALBUTEROL 0.5-2.5 (3) MG/3ML IN SOLN
3.0000 mL | Freq: Four times a day (QID) | RESPIRATORY_TRACT | Status: DC
  Administered 2021-04-14 (×3): 3 mL via RESPIRATORY_TRACT
  Filled 2021-04-14 (×3): qty 3

## 2021-04-14 NOTE — Progress Notes (Addendum)
ANTICOAGULATION CONSULT NOTE - Initial Consult  Pharmacy Consult for Enoxaparin Indication:  Atrial Fibrillation, Possible PE (VQ Scan Pending)  No Known Allergies  Patient Measurements: Height: 72 inches Total Body Weight: 119.5 kg  Vital Signs: Temp: 97.4 F (36.3 C) (11/01 1352) Temp Source: Oral (11/01 1352) BP: 158/115 (11/01 1352) Pulse Rate: 76 (11/01 1352)  Labs: Recent Labs    04/13/21 1842 04/13/21 2240 04/14/21 0945  HGB 15.0  --   --   HCT 47.3  --   --   PLT 176  --   --   CREATININE 1.80*  --  2.01*  TROPONINIHS 40* 46*  --     Estimated Creatinine Clearance: 53.5 mL/min (A) (by C-G formula based on SCr of 2.01 mg/dL (H)).   Medical History: Past Medical History:  Diagnosis Date   Arrhythmia    atrial flutter   CHF (congestive heart failure) (HCC)    Chronic kidney disease    COPD (chronic obstructive pulmonary disease) (HCC)    Coronary artery disease    Depression    Diabetes mellitus without complication (HCC)    GERD (gastroesophageal reflux disease)    Gout    Hypertension    Influenza A with respiratory manifestations    Mental disorder     Assessment: 60 yr old man admitted with CP and hx of multiple syncopal episodes from jail; med hx includes COPD, CAD, CHF, HTN, stage IIIB CKD, DM2, atrial fibrillation (on apixaban PTA, last dose unknown; pt does not recall taking apixaban today), polysubstance abuse, depression, gout, GERD, medication noncompliance. Pt with acute respiratory failure with hypoxia; D-dimer elevated at 0.73; VQ scan pending (unable to do chest CT, due to elevated Scr). Pharmacy is consulted to dose enoxaparin pending VQ scan results.   H/H 15/47.3, plt 176; Scr 2.01; TBW CrCl 68 ml/min; given possible recent apixaban exposure, ck'd baseline heparin level, which was 0.23 units/ml, indicating that most apixaban out of pt's system at this point  Goal of Therapy:  Anti-Xa level 0.6-1 units/ml 4hrs after LMWH dose  given Monitor platelets by anticoagulation protocol: Yes   Plan:  Enoxaparin 1 mg/kg (120 mg) SQ Q 12 hrs Monitor CBC, renal function, LMWH levels as indicated Monitor for bleeding  Gillermina Hu, PharmD, BCPS, [facility] Clinical Pharmacist 04/14/2021,5:25 PM

## 2021-04-14 NOTE — Progress Notes (Signed)
PROGRESS NOTE   MAZEN MARCIN  WUJ:[id number]    DOB: 03-27-1961    DOA: 04/13/2021  PCP: [name], MD   I have briefly reviewed patients previous medical records in [facility].  Chief Complaint  Patient presents with   Chest Pain    Brief Narrative:  60 year old male with medical history significant of COPD, CAD, chronic combined CHF, hypertension, stage IIIb CKD, type II DM, paroxysmal A. fib on Eliquis, polysubstance abuse (marijuana, cocaine, tobacco), depression, gout, GERD, medication noncompliance, presented to the ED from jail (per report, from home but recently went to jail over the last couple days) for evaluation of chest pain, dyspnea and multiple syncopal episodes.  Patient was given NTG x1 and aspirin 324 Mg by EMS without relief.  Oxygen saturations 88% on room air, placed on 2L/min supplemental oxygen.  He was admitted for COPD exacerbation, acute on chronic combined CHF and associated acute respiratory failure with hypoxia.   Assessment & Plan:  Principal Problem:   CHF exacerbation ([place]) Active Problems:   CKD (chronic kidney disease) stage 3, GFR 30-59 ml/min (HCC)   COPD exacerbation (HCC)   Chest pain   Syncope   COPD exacerbation: Ongoing tobacco abuse.  Cessation counseled.  S/p IV Solu-Medrol 125 Mg and bronchodilator treatment in ED.  Continue IV Solu-Medrol 60 mg every 12 hours, DuoNebs every 6 hours, Pulmicort nebs twice daily, albuterol nebs as needed, continue empirically started doxycycline for cough.  Added flutter valve.  Acute on chronic combined systolic and diastolic CHF: Noncompliance suspected.  LVEF 40% and grade 1 diastolic dysfunction by echo August 2022.  Chest x-ray showed some central vascular congestion.  BNP mildly elevated.  Initially started on IV Lasix 40 mg twice daily.  However on my evaluation, no features of significant volume overload.  Will transition back to oral Lasix and monitor.  Acute respiratory failure with  hypoxia: Likely due to COPD exacerbation and mild decompensated CHF.  D-dimer mildly elevated.  Low index of suspicion for PE which is still on the differentials.  Unable to do CTA chest due to elevated creatinine.  For now placed on therapeutic dose Lovenox pending VQ scan.  Initial oxygen saturation was 88% on room air but currently weaned off to room air and saturating at 97%.  Atypical chest pain/CAD history: High-sensitivity troponin x2: 48 > 46.  EKG without acute changes.  UDS negative.  Per admitting MD, patient had cardiac catheterization done in 2015 at [facility] but unable to see results under care everywhere.  Follow repeat echo results.  Syncope: Patient reported multiple episodes of syncope.  Suspecting cough syncope.  Not sure if these were witnessed.  Check orthostatics.  No significant valvular abnormalities by echo in August 2022.  Low index of suspicion for PE but will treat with full anticoagulation with Lovenox while checking VQ scan.  Follow 2D echo results.  Telemetry shows sinus rhythm.  Prior to discharge, patient must be counseled that he should not drive for 6 months  Hypertensive urgency: Resumed carvedilol.  Unsure if he is really taking losartan-we will hold due to CKD.  As needed IV hydralazine and monitor closely and adjust meds as needed.  CKD stage IIIb: Baseline creatinine in the 1.4-1.6 but has been up in the twos intermittently.  Presented with creatinine of 1.8.  Now up to 2.01.  Last known creatinine was 2.27 on 8/31.  This may still be close to his baseline or an element of acute kidney injury  may be due to a dose of IV Lasix.  Avoid nephrotoxic's.  Follow BMP in AM and if creatinine rising, may have to hold Lasix.  Type II DM: A1c 6.9 on 02/09/2021.  Continue SSI and monitor.  Paroxysmal A. fib: Unsure regarding compliance with his medications.  Currently in sinus rhythm.  Continue carvedilol.  Lovenox as noted above.  Gout: No acute flare.  Continue  allopurinol.   DVT prophylaxis:   Full dose Lovenox.   Code Status: Full Code Family Communication: None at bedside. Disposition:  Status is: Inpatient  Remains inpatient appropriate because: Acute COPD exacerbation on IV Solu-Medrol.        Consultants:   None  Procedures:   None  Antimicrobials:    Anti-infectives (From admission, onward)    Start     Dose/Rate Route Frequency Ordered Stop   04/14/21 1000  doxycycline (VIBRA-TABS) tablet 100 mg        100 mg Oral Every 12 hours 04/14/21 0453           Subjective:  Patient seen while still in the ED room.  Police at bedside.  Patient reports ongoing cough and dyspnea and chest pain only on coughing.  Objective:   Vitals:   04/14/21 0715 04/14/21 1000 04/14/21 1155 04/14/21 1352  BP: 140/84 (!) 159/101 (!) 155/103 (!) 158/115  Pulse: 72 82 93 76  Resp: 19 (!) 25 16   Temp:    (!) 97.4 F (36.3 C)  TempSrc:    Oral  SpO2: 94% 95% 94% 97%    General exam: Young male, moderately built and obese sitting up comfortably in bed without distress.  Although he reports dyspnea, looks very comfortable. Respiratory system: Clear to auscultation anteriorly.  Few rhonchi posteriorly.  No crackles.  Able to speak in full sentences.  No increased work of breathing.  Currently on room air and saturating in the mid 90s.?  Some reproducible chest wall tenderness but subjective and fluctuating. Cardiovascular system: S1 & S2 heard, RRR. No JVD, murmurs, rubs, gallops or clicks. No pedal edema.  Telemetry personally reviewed: Sinus rhythm. Gastrointestinal system: Abdomen is nondistended, soft and nontender. No organomegaly or masses felt. Normal bowel sounds heard. Central nervous system: Alert and oriented. No focal neurological deficits. Extremities: Symmetric 5 x 5 power.  Has left upper extremity in light lower extremity cuffs to the bed rails. Skin: No rashes, lesions or ulcers Psychiatry: Judgement and insight appear  normal. Mood & affect appropriate.     Data Reviewed:   I have personally reviewed following labs and imaging studies   CBC: Recent Labs  Lab 04/13/21 1842  WBC 10.1  HGB 15.0  HCT 47.3  MCV 92.9  PLT 941    Basic Metabolic Panel: Recent Labs  Lab 04/13/21 1842 04/14/21 0945  NA 137 133*  K 4.2 4.3  CL 105 99  CO2 23 24  GLUCOSE 82 290*  BUN 26* 22*  CREATININE 1.80* 2.01*  CALCIUM 9.2 9.2    Liver Function Tests: No results for input(s): AST, ALT, ALKPHOS, BILITOT, PROT, ALBUMIN in the last 168 hours.  CBG: Recent Labs  Lab 04/14/21 0804 04/14/21 1154 04/14/21 1645  GLUCAP 198* 236* 197*    Microbiology Studies:   Recent Results (from the past 240 hour(s))  Resp Panel by RT-PCR (Flu A&B, Covid) Nasopharyngeal Swab     Status: None   Collection Time: 04/14/21  1:26 AM   Specimen: Nasopharyngeal Swab; Nasopharyngeal(NP) swabs in vial transport medium  Result Value Ref Range Status   SARS Coronavirus 2 by RT PCR NEGATIVE NEGATIVE Final    Comment: (NOTE) SARS-CoV-2 target nucleic acids are NOT DETECTED.  The SARS-CoV-2 RNA is generally detectable in upper respiratory specimens during the acute phase of infection. The lowest concentration of SARS-CoV-2 viral copies this assay can detect is 138 copies/mL. A negative result does not preclude SARS-Cov-2 infection and should not be used as the sole basis for treatment or other patient management decisions. A negative result may occur with  improper specimen collection/handling, submission of specimen other than nasopharyngeal swab, presence of viral mutation(s) within the areas targeted by this assay, and inadequate number of viral copies(<138 copies/mL). A negative result must be combined with clinical observations, patient history, and epidemiological information. The expected result is Negative.  Fact Sheet for Patients:  EntrepreneurPulse.com.au  Fact Sheet for Healthcare  Providers:  IncredibleEmployment.be  This test is no t yet approved or cleared by the [place] FDA and  has been authorized for detection and/or diagnosis of SARS-CoV-2 by FDA under an Emergency Use Authorization (EUA). This EUA will remain  in effect (meaning this test can be used) for the duration of the COVID-19 declaration under Section 564(b)(1) of the Act, 21 U.S.C.section 360bbb-3(b)(1), unless the authorization is terminated  or revoked sooner.       Influenza A by PCR NEGATIVE NEGATIVE Final   Influenza B by PCR NEGATIVE NEGATIVE Final    Comment: (NOTE) The Xpert Xpress SARS-CoV-2/FLU/RSV plus assay is intended as an aid in the diagnosis of influenza from Nasopharyngeal swab specimens and should not be used as a sole basis for treatment. Nasal washings and aspirates are unacceptable for Xpert Xpress SARS-CoV-2/FLU/RSV testing.  Fact Sheet for Patients: EntrepreneurPulse.com.au  Fact Sheet for Healthcare Providers: IncredibleEmployment.be  This test is not yet approved or cleared by the [place] FDA and has been authorized for detection and/or diagnosis of SARS-CoV-2 by FDA under an Emergency Use Authorization (EUA). This EUA will remain in effect (meaning this test can be used) for the duration of the COVID-19 declaration under Section 564(b)(1) of the Act, 21 U.S.C. section 360bbb-3(b)(1), unless the authorization is terminated or revoked.  Performed at [facility], [place] [address], [place], [place] [address]      Radiology Studies:  DG Chest 2 View  Result Date: 04/13/2021 CLINICAL DATA:  Chest pain. EXAM: CHEST - 2 VIEW COMPARISON:  02/09/2021 FINDINGS: Lower lung volumes from prior exam. Cardiomegaly is grossly stable. There is central vascular congestion with slight increase in peribronchial thickening. Patchy bibasilar opacities. No pneumothorax or large pleural effusion. Thoracic  spondylosis. IMPRESSION: 1. Cardiomegaly with central vascular congestion. Peribronchial thickening may be bronchitic or congestive. 2. Patchy bibasilar opacities favor atelectasis. Electronically Signed   By: Keith Rake M.D.   On: 04/13/2021 19:15     Scheduled Meds:    allopurinol  200 mg Oral Daily   apixaban  5 mg Oral BID   atorvastatin  40 mg Oral Daily   budesonide (PULMICORT) nebulizer solution  0.25 mg Nebulization BID   carvedilol  6.25 mg Oral BID   doxycycline  100 mg Oral Q12H   [START ON 04/15/2021] furosemide  40 mg Oral Daily   insulin aspart  0-5 Units Subcutaneous QHS   insulin aspart  0-9 Units Subcutaneous TID WC   ipratropium-albuterol  3 mL Nebulization Q6H   methylPREDNISolone (SOLU-MEDROL) injection  120 mg Intravenous Q24H   montelukast  10 mg Oral QHS  Continuous Infusions:     LOS: 0 days     Vernell Leep, MD, [place], [facility]. [employer]    To contact the attending provider between 7A-7P or the covering provider during after hours 7P-7A, please log into the web site [email or web address] and access using universal [place] password for that web site. If you do not have the password, please call the hospital operator.  04/14/2021, 4:57 PM

## 2021-04-14 NOTE — H&P (Signed)
History and Physical    Chris [name] YIF:[id number] DOB: 06-14-1961 DOA: 04/13/2021  PCP: [name], MD Patient coming from: [facility]  Chief Complaint: Chest pain, shortness of breath, syncope  HPI: Chris [name] is a 60 y.o. male with medical history significant of COPD, CAD, chronic combined CHF, hypertension, CKD stage III, non-insulin-dependent type 2 diabetes, paroxysmal A. fib on Eliquis, polysubstance abuse (marijuana, cocaine, tobacco), depression, gout, GERD, history of medication noncompliance presenting to the ED from jail for evaluation of chest pain, shortness of breath, and multiple syncopal episodes.  Patient was given nitroglycerin x1 and aspirin 324 mg by EMS without relief. Oxygen saturation 88% on room air, placed on 2 L supplemental oxygen.  Labs notable for WBC 10.1, hemoglobin 15.0, platelet count 176k.  Sodium 137, potassium 4.2, chloride 105, bicarb 23, BUN 26, creatinine 1.8 (baseline 1.4-1.6), glucose 82.  High-sensitivity troponin 40 > 46.  EKG without acute ischemic changes.  UDS negative.  BNP 210.  COVID and influenza PCR negative.  Chest x-ray showing cardiomegaly with central vascular congestion and slight increase in peribronchial thickening. Patient was given bronchodilator treatments, IV Lasix 40 mg, nitroglycerin, and IV Solu-Medrol 125 mg.  Patient reports 3-day history of dyspnea with exertion and at rest.  Also endorsing orthopnea.  He is not sure if his legs are swollen.  He is not sure which medications he is currently taking as he does not know how to read.  States he has been coughing a lot and "passing out" each time he coughs.  Also reports left-sided chest pain.  He refused to give any additional history.  Review of Systems:  All systems reviewed and apart from history of presenting illness, are negative.  Past Medical History:  Diagnosis Date   Arrhythmia    atrial flutter   CHF (congestive heart failure) (HCC)    Chronic kidney disease     COPD (chronic obstructive pulmonary disease) ([place])    Coronary artery disease    Depression    Diabetes mellitus without complication ([place])    GERD (gastroesophageal reflux disease)    Gout    Hypertension    Influenza A with respiratory manifestations    Mental disorder     Past Surgical History:  Procedure Laterality Date   ANKLE SURGERY     CARDIAC CATHETERIZATION     CARDIOVERSION N/A 07/08/2020   Procedure: CARDIOVERSION;  Surgeon: Corey Skains, MD;  Location: [facility] ORS;  Service: Cardiovascular;  Laterality: N/A;   HERNIA REPAIR     x2   SHOULDER SURGERY     TEE WITHOUT CARDIOVERSION N/A 07/08/2020   Procedure: TRANSESOPHAGEAL ECHOCARDIOGRAM (TEE);  Surgeon: Corey Skains, MD;  Location: [facility] ORS;  Service: Cardiovascular;  Laterality: N/A;     reports that he has been smoking cigarettes. He has a 20.00 pack-year smoking history. He has never used smokeless tobacco. He reports current drug use. Frequency: 21.00 times per week. Drugs: Marijuana and Cocaine. He reports that he does not drink alcohol.  No Known Allergies  Family History  Problem Relation Age of Onset   Heart disease Father    Diabetes Mother    HIV Brother    Healthy Son    Healthy Daughter     Prior to Admission medications   Medication Sig Start Date End Date Taking? Authorizing Provider  albuterol (PROVENTIL) (2.5 MG/3ML) 0.083% nebulizer solution TAKE 3 MLS BY NEBULIZATION EVERY 6 (SIX) HOURS AS NEEDED FOR SHORTNESS OF BREATH. 11/30/20 11/30/21 Yes  [name], MD  albuterol (VENTOLIN HFA) 108 (90 Base) MCG/ACT inhaler INHALE 2 PUFFS INTO THE LUNGS EVERY 6 (SIX) HOURS AS NEEDED FOR WHEEZING OR SHORTNESS OF BREATH. 11/30/20  Yes [name], MD  aspirin 81 MG EC tablet Take 1 tablet (81 mg total) by mouth daily. 11/30/20  Yes [name], MD  budesonide-formoterol [facility]) 160-4.5 MCG/ACT inhaler Inhale 2 puffs into the lungs 2 (two) times daily. 11/30/20  Yes [name], MD  carvedilol (COREG) 6.25 MG tablet Take 1 tablet (6.25 mg total) by mouth 2 (two) times daily. 12/01/20  Yes Alisa Graff, FNP  Accu-Chek Softclix Lancets lancets Use as instructed 12/12/20   [name], MD  allopurinol (ZYLOPRIM) 100 MG tablet TAKE 2 TABLETS (200 MG TOTAL) BY MOUTH DAILY. 11/30/20 11/30/21  [name], MD  apixaban (ELIQUIS) 5 MG TABS tablet TAKE 1 TABLET (5 MG TOTAL) BY MOUTH 2 (TWO) TIMES DAILY. 12/01/20 12/01/21  [name], MD  atorvastatin (LIPITOR) 40 MG tablet Take 1 tablet (40 mg total) by mouth daily. 11/30/20   [name], MD  Blood Glucose Monitoring Suppl (ACCU-CHEK GUIDE) w/Device KIT Use as directed 12/12/20   [name], MD  colchicine 0.6 MG tablet Take 2 tabs (1.2 mg) at the onset of a gout flare, may repeat 1 tab (0.6 mg) after 2 hours if symptoms persist. 11/30/20   [name], MD  dapagliflozin propanediol (FARXIGA) 10 MG TABS tablet Take 1 tablet (10 mg total) by mouth daily. 12/01/20   Alisa Graff, FNP  furosemide (LASIX) 40 MG tablet Take 1 tablet (40 mg total) by mouth daily. 12/01/20 12/01/21  Alisa Graff, FNP  glipiZIDE (GLUCOTROL) 5 MG tablet Take 0.5 tablets (2.5 mg total) by mouth 2 (two) times daily before a meal. 11/30/20 11/30/21  [name], MD  glucose blood (ACCU-CHEK GUIDE) test strip Use as directed to check blood sugar 1-2 times a day 12/12/20   [name], MD  montelukast (SINGULAIR) 10 MG tablet TAKE 1 TABLET (10 MG TOTAL) BY MOUTH AT BEDTIME. 12/29/20 12/29/21  [name], MD  predniSONE (DELTASONE) 20 MG tablet Take 2 tablets (40 mg total) by mouth daily with breakfast. 02/12/21   Bonnielee Haff, MD  triamcinolone cream (KENALOG) 0.1 % Apply 1 application topically 2 (two) times daily as needed (skin irritation on face). 11/12/20   [name], PA-C  metoprolol tartrate (LOPRESSOR) 100 MG tablet Take 1 tablet (100 mg total) by mouth 2 (two) times daily. 07/09/20  07/11/20  [name], MD    Physical Exam: Vitals:   04/13/21 1753 04/14/21 0030 04/14/21 0155  BP: (!) 157/107 (!) 148/90 (!) 153/105  Pulse: 66 68 69  Resp: 16 (!) 31 (!) 28  Temp: 97.9 F (36.6 C)    TempSrc: Oral    SpO2: 95% 94% (!) 88%    Physical Exam Constitutional:      General: He is not in acute distress. HENT:     Head: Normocephalic and atraumatic.  Eyes:     Extraocular Movements: Extraocular movements intact.     Conjunctiva/sclera: Conjunctivae normal.  Cardiovascular:     Rate and Rhythm: Normal rate and regular rhythm.     Pulses: Normal pulses.  Pulmonary:     Effort: Pulmonary effort is normal. No respiratory distress.     Breath sounds: Wheezing and rales present.  Abdominal:     General: Bowel sounds are  normal.     Palpations: Abdomen is soft.     Tenderness: There is no abdominal tenderness. There is no guarding.  Musculoskeletal:     Cervical back: Normal range of motion and neck supple.     Right lower leg: No edema.     Left lower leg: No edema.  Skin:    General: Skin is warm and dry.  Neurological:     General: No focal deficit present.     Mental Status: He is alert and oriented to person, place, and time.     Labs on Admission: I have personally reviewed following labs and imaging studies  CBC: Recent Labs  Lab 04/13/21 1842  WBC 10.1  HGB 15.0  HCT 47.3  MCV 92.9  PLT 734   Basic Metabolic Panel: Recent Labs  Lab 04/13/21 1842  NA 137  K 4.2  CL 105  CO2 23  GLUCOSE 82  BUN 26*  CREATININE 1.80*  CALCIUM 9.2   GFR: CrCl cannot be calculated (Unknown ideal weight.). Liver Function Tests: No results for input(s): AST, ALT, ALKPHOS, BILITOT, PROT, ALBUMIN in the last 168 hours. No results for input(s): LIPASE, AMYLASE in the last 168 hours. No results for input(s): AMMONIA in the last 168 hours. Coagulation Profile: No results for input(s): INR, PROTIME in the last 168 hours. Cardiac Enzymes: No results for  input(s): CKTOTAL, CKMB, CKMBINDEX, TROPONINI in the last 168 hours. BNP (last 3 results) No results for input(s): PROBNP in the last 8760 hours. HbA1C: No results for input(s): HGBA1C in the last 72 hours. CBG: Recent Labs  Lab 04/14/21 0149  GLUCAP 285*   Lipid Profile: No results for input(s): CHOL, HDL, LDLCALC, TRIG, CHOLHDL, LDLDIRECT in the last 72 hours. Thyroid Function Tests: No results for input(s): TSH, T4TOTAL, FREET4, T3FREE, THYROIDAB in the last 72 hours. Anemia Panel: No results for input(s): VITAMINB12, FOLATE, FERRITIN, TIBC, IRON, RETICCTPCT in the last 72 hours. Urine analysis:    Component Value Date/Time   COLORURINE COLORLESS (A) 04/14/2021 0044   APPEARANCEUR CLEAR 04/14/2021 0044   APPEARANCEUR Clear 11/30/2018 1035   LABSPEC 1.004 (L) 04/14/2021 0044   PHURINE 6.0 04/14/2021 0044   GLUCOSEU 150 (A) 04/14/2021 0044   HGBUR SMALL (A) 04/14/2021 0044   BILIRUBINUR NEGATIVE 04/14/2021 0044   BILIRUBINUR Negative 11/30/2018 1035   KETONESUR NEGATIVE 04/14/2021 0044   PROTEINUR NEGATIVE 04/14/2021 0044   NITRITE NEGATIVE 04/14/2021 0044   LEUKOCYTESUR NEGATIVE 04/14/2021 0044    Radiological Exams on Admission: DG Chest 2 View  Result Date: 04/13/2021 CLINICAL DATA:  Chest pain. EXAM: CHEST - 2 VIEW COMPARISON:  02/09/2021 FINDINGS: Lower lung volumes from prior exam. Cardiomegaly is grossly stable. There is central vascular congestion with slight increase in peribronchial thickening. Patchy bibasilar opacities. No pneumothorax or large pleural effusion. Thoracic spondylosis. IMPRESSION: 1. Cardiomegaly with central vascular congestion. Peribronchial thickening may be bronchitic or congestive. 2. Patchy bibasilar opacities favor atelectasis. Electronically Signed   By: Keith Rake M.D.   On: 04/13/2021 19:15    EKG: Independently reviewed.  Sinus rhythm, incomplete RBBB.  No significant change compared to prior tracing from August  2022.  Assessment/Plan Principal Problem:   CHF exacerbation (HCC) Active Problems:   CKD (chronic kidney disease) stage 3, GFR 30-59 ml/min (HCC)   COPD exacerbation (HCC)   Chest pain   Syncope   Acute on chronic combined CHF Suspect noncompliance.  LVEF 40% and grade 1 diastolic dysfunction on echo done August 2022.  Chest x-ray showing central vascular congestion.  BNP mildly elevated. -Continue IV Lasix 40 mg twice daily.  Monitor renal function.  Monitor intake and output, daily weights.  Low-sodium diet with fluid restriction.  Acute COPD exacerbation Suspect noncompliance.  Received IV Solu-Medrol 125 mg and bronchodilator treatments in the ED.  Continues to have wheezing. -IV Solu-Medrol 60 mg every 12 hours, DuoNeb every 6 hours, Pulmicort neb twice daily, albuterol neb prn.  Start doxycycline given cough.  Acute hypoxemic respiratory failure Likely due to combination of decompensated CHF and COPD exacerbation.  PE is also on the differential as he might not be compliant with Eliquis.  Oxygen saturation 88% on room air, satting well on 2 L supplemental oxygen. -Continue supplemental oxygen, wean as tolerated.  Check D-dimer level, if elevated, start heparin and obtain VQ scan in the morning.  It is best to avoid CT angiogram given CKD and low GFR.  Chest pain CAD No relief with nitroglycerin x2.  High-sensitivity troponin mildly elevated but stable (40 > 46).  EKG without acute ischemic changes.  UDS negative.  It seems he had cardiac catheterization done in 2015 at [facility] but I am not able to see results under care everywhere. -Patient was given full dose aspirin by EMS.  Continue to trend troponin.  Repeat echocardiogram.  Syncope Patient reporting multiple syncopal episodes which he describes as passing out every time he coughs; did not elaborate.  Orthostatics could not be checked in the ED.  No significant valvular abnormalities on echo done in August 2022.  PE is also on  the differential. -Given decompensated CHF and complaints of chest pain, repeat echocardiogram has been ordered.  Continue cardiac monitoring.  Check D-dimer level.  Hypertension -Hold antihypertensives at this time, try to check orthostatics again in the morning  CKD stage III Creatinine currently 1.8, baseline 1.4-1.6. Continue to monitor renal function  Non-insulin-dependent type 2 diabetes A1c 6.9 on 02/09/2021 -Sliding scale insulin sensitive ACHS.   Paroxysmal A. Fib: Currently in sinus rhythm. Depression Gout GERD -Pharmacy med rec pending.  DVT prophylaxis: Resume Eliquis after pharmacy med rec is done. Code Status: Full code Family Communication: No family available at this time. Disposition Plan: Status is: Inpatient  Remains inpatient appropriate because: Decompensated CHF and COPD exacerbation  Level of care: Level of care: Telemetry Cardiac  The medical decision making on this patient was of high complexity and the patient is at high risk for clinical deterioration, therefore this is a level 3 visit.  Shela Leff MD [facility]  If 7PM-7AM, please contact night-coverage [email or web address]  04/14/2021, 4:52 AM

## 2021-04-14 NOTE — ED Notes (Signed)
Pt requested and given cranberry juice and a cup of ice.
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
## 2021-04-14 NOTE — Progress Notes (Signed)
Nuc Med on call notified.

## 2021-04-14 NOTE — ED Notes (Signed)
Pt unable to stand for three minutes when getting orthostatic vital signs

## 2021-04-14 NOTE — ED Provider Notes (Signed)
[facility] Provider Note   CSN: [id number] Arrival date & time: 04/13/21  1751     History Chief Complaint  Patient presents with   Chest Pain    Chris [name] is a 60 y.o. male with a history of substance abuse including cocaine, CKD, COPD, chronic diastolic heart failure, diabetes resents to the emergency department with 3-4 days of chest pain.  Over that time, patient reports multiple syncopal episodes without prodrome.  Patient arrives via EMS and was given aspirin and nitroglycerin.  Patient reports this did not significantly help his chest pain.  He reports for the last several days he has had dyspnea on exertion and chest pain with exertion.  Reports he has been coughing up thick white sputum.  States he has associated peripheral edema, wheezing and shortness of breath.  Reports he generally feels poorly.  States he has been taking his medications including his Lasix as directed.  Reports the last time he felt this way he required hospitalization.  Patient is currently incarcerated.  The history is provided by the patient and medical records. No language interpreter was used.      Past Medical History:  Diagnosis Date   Arrhythmia    atrial flutter   CHF (congestive heart failure) (HCC)    Chronic kidney disease    COPD (chronic obstructive pulmonary disease) ([place])    Coronary artery disease    Depression    Diabetes mellitus without complication ([place])    GERD (gastroesophageal reflux disease)    Gout    Hypertension    Influenza A with respiratory manifestations    Mental disorder     Patient Active Problem List   Diagnosis Date Noted   CHF exacerbation ([place]) 04/14/2021   Left-sided weakness 10/28/2020   COPD exacerbation (HCC)    Typical atrial flutter (HCC)    Acute respiratory failure with hypoxia ([place]) 07/05/2020   CHF (congestive heart failure) ([place]) 07/04/2020   Acute exacerbation of CHF (congestive heart failure)  ([place]) 06/16/2020   COPD with acute exacerbation ([place]) 06/16/2020   Influenza vaccine refused 05/06/2020   Acute on chronic combined systolic (congestive) and diastolic (congestive) heart failure ([place]) 05/05/2020   Acute decompensated heart failure ([place]) 05/04/2020   Illiteracy 05/04/2020   Chronic respiratory failure with hypoxia, on home oxygen therapy ([place]) 12/28/2019   Type 2 diabetes mellitus with stage 3 chronic kidney disease ([place]) 12/25/2019   Acute and chronic respiratory failure (acute-on-chronic) ([place]) 12/25/2019   Acute on chronic combined systolic and diastolic CHF (congestive heart failure) ([place]) 10/26/2019   Elevated troponin I level 10/26/2019   Acute on chronic diastolic (congestive) heart failure ([place]) 10/26/2019   History of gout 02/01/2019   Seasonal allergic rhinitis due to pollen 02/01/2019   Tobacco dependence 11/30/2018   Microscopic hematuria 11/30/2018   Depression 11/30/2018   Difficulty controlling anger 11/30/2018   CAP (community acquired pneumonia) 08/11/2018   COPD (chronic obstructive pulmonary disease) (HCC)    CKD (chronic kidney disease) stage 3, GFR 30-59 ml/min ([place]) 08/10/2018   Recurrent epistaxis 04/21/2018   Mixed hyperlipidemia 07/28/2017   Essential hypertension 86/57/8469   Chronic systolic heart failure ([place]) 10/25/2014   Cocaine abuse ([place]) 02/20/2013   Cannabis abuse 02/20/2013   Back pain, chronic 02/20/2013    Past Surgical History:  Procedure Laterality Date   ANKLE SURGERY     CARDIAC CATHETERIZATION     CARDIOVERSION N/A 07/08/2020   Procedure: CARDIOVERSION;  Surgeon: [name]  J, MD;  Location: [facility] ORS;  Service: Cardiovascular;  Laterality: N/A;   HERNIA REPAIR     x2   SHOULDER SURGERY     TEE WITHOUT CARDIOVERSION N/A 07/08/2020   Procedure: TRANSESOPHAGEAL ECHOCARDIOGRAM (TEE);  Surgeon: Corey Skains, MD;  Location: [facility] ORS;  Service: Cardiovascular;  Laterality: N/A;       Family History  Problem  Relation Age of Onset   Heart disease Father    Diabetes Mother    HIV Brother    Healthy Son    Healthy Daughter     Social History   Tobacco Use   Smoking status: Every Day    Packs/day: 1.00    Years: 20.00    Pack years: 20.00    Types: Cigarettes   Smokeless tobacco: Never   Tobacco comments:    less than 1 pack per day  Vaping Use   Vaping Use: Never used  Substance Use Topics   Alcohol use: No   Drug use: Yes    Frequency: 21.0 times per week    Types: Marijuana, Cocaine    Comment: no longer- Cocaine    Home Medications Prior to Admission medications   Medication Sig Start Date End Date Taking? Authorizing Provider  albuterol (PROVENTIL) (2.5 MG/3ML) 0.083% nebulizer solution TAKE 3 MLS BY NEBULIZATION EVERY 6 (SIX) HOURS AS NEEDED FOR SHORTNESS OF BREATH. 11/30/20 11/30/21 Yes [name], MD  albuterol (VENTOLIN HFA) 108 (90 Base) MCG/ACT inhaler INHALE 2 PUFFS INTO THE LUNGS EVERY 6 (SIX) HOURS AS NEEDED FOR WHEEZING OR SHORTNESS OF BREATH. 11/30/20  Yes [name], MD  aspirin 81 MG EC tablet Take 1 tablet (81 mg total) by mouth daily. 11/30/20  Yes [name], MD  budesonide-formoterol [facility]) 160-4.5 MCG/ACT inhaler Inhale 2 puffs into the lungs 2 (two) times daily. 11/30/20  Yes [name], MD  carvedilol (COREG) 6.25 MG tablet Take 1 tablet (6.25 mg total) by mouth 2 (two) times daily. 12/01/20  Yes Alisa Graff, FNP  Accu-Chek Softclix Lancets lancets Use as instructed 12/12/20   [name], MD  allopurinol (ZYLOPRIM) 100 MG tablet TAKE 2 TABLETS (200 MG TOTAL) BY MOUTH DAILY. 11/30/20 11/30/21  [name], MD  apixaban (ELIQUIS) 5 MG TABS tablet TAKE 1 TABLET (5 MG TOTAL) BY MOUTH 2 (TWO) TIMES DAILY. 12/01/20 12/01/21  [name], MD  atorvastatin (LIPITOR) 40 MG tablet Take 1 tablet (40 mg total) by mouth daily. 11/30/20   [name], MD  Blood Glucose Monitoring Suppl (ACCU-CHEK GUIDE) w/Device KIT  Use as directed 12/12/20   [name], MD  colchicine 0.6 MG tablet Take 2 tabs (1.2 mg) at the onset of a gout flare, may repeat 1 tab (0.6 mg) after 2 hours if symptoms persist. 11/30/20   [name], MD  dapagliflozin propanediol (FARXIGA) 10 MG TABS tablet Take 1 tablet (10 mg total) by mouth daily. 12/01/20   Alisa Graff, FNP  furosemide (LASIX) 40 MG tablet Take 1 tablet (40 mg total) by mouth daily. 12/01/20 12/01/21  Alisa Graff, FNP  glipiZIDE (GLUCOTROL) 5 MG tablet Take 0.5 tablets (2.5 mg total) by mouth 2 (two) times daily before a meal. 11/30/20 11/30/21  [name], MD  glucose blood (ACCU-CHEK GUIDE) test strip Use as directed to check blood sugar 1-2 times a day 12/12/20   [name], MD  montelukast (SINGULAIR) 10 MG tablet TAKE 1 TABLET (10 MG  TOTAL) BY MOUTH AT BEDTIME. 12/29/20 12/29/21  [name], MD  predniSONE (DELTASONE) 20 MG tablet Take 2 tablets (40 mg total) by mouth daily with breakfast. 02/12/21   Bonnielee Haff, MD  triamcinolone cream (KENALOG) 0.1 % Apply 1 application topically 2 (two) times daily as needed (skin irritation on face). 11/12/20   [name], PA-C  metoprolol tartrate (LOPRESSOR) 100 MG tablet Take 1 tablet (100 mg total) by mouth 2 (two) times daily. 07/09/20 07/11/20  [name], MD    Allergies    Patient has no known allergies.  Review of Systems   Review of Systems  Constitutional:  Positive for fatigue. Negative for appetite change, diaphoresis, fever and unexpected weight change.  HENT:  Negative for mouth sores.   Eyes:  Negative for visual disturbance.  Respiratory:  Positive for cough, chest tightness, shortness of breath and wheezing.   Cardiovascular:  Positive for chest pain and leg swelling.  Gastrointestinal:  Negative for abdominal pain, constipation, diarrhea, nausea and vomiting.  Endocrine: Negative for polydipsia, polyphagia and polyuria.  Genitourinary:  Negative for dysuria,  frequency, hematuria and urgency.  Musculoskeletal:  Negative for back pain and neck stiffness.  Skin:  Negative for rash.  Allergic/Immunologic: Negative for immunocompromised state.  Neurological:  Negative for syncope, light-headedness and headaches.  Hematological:  Does not bruise/bleed easily.  Psychiatric/Behavioral:  Negative for sleep disturbance. The patient is not nervous/anxious.    Physical Exam Updated Vital Signs BP (!) 148/90   Pulse 68   Temp 97.9 F (36.6 C) (Oral)   Resp (!) 31   SpO2 94%   Physical Exam Vitals and nursing note reviewed.  Constitutional:      General: He is not in acute distress.    Appearance: He is not diaphoretic.  HENT:     Head: Normocephalic.  Eyes:     General: No scleral icterus.    Conjunctiva/sclera: Conjunctivae normal.  Cardiovascular:     Rate and Rhythm: Normal rate and regular rhythm.     Pulses: Normal pulses.          Radial pulses are 2+ on the right side and 2+ on the left side.  Pulmonary:     Effort: No tachypnea, accessory muscle usage, prolonged expiration, respiratory distress or retractions.     Breath sounds: No stridor. Wheezing present.     Comments: Equal chest rise. No increased work of breathing. Abdominal:     General: There is no distension.     Palpations: Abdomen is soft.     Tenderness: There is no abdominal tenderness. There is no guarding or rebound.  Musculoskeletal:     Cervical back: Normal range of motion.     Right lower leg: 2+ Edema present.     Left lower leg: 2+ Edema present.     Comments: Moves all extremities equally and without difficulty.  Skin:    General: Skin is warm and dry.     Capillary Refill: Capillary refill takes less than 2 seconds.  Neurological:     Mental Status: He is alert.     GCS: GCS eye subscore is 4. GCS verbal subscore is 5. GCS motor subscore is 6.     Comments: Speech is clear and goal oriented.  Psychiatric:        Mood and Affect: Mood normal.    ED  Results / Procedures / Treatments   Labs (all labs ordered are listed, but only abnormal results are displayed) Labs Reviewed  BASIC METABOLIC PANEL - Abnormal; Notable for the following components:      Result Value   BUN 26 (*)    Creatinine, Ser 1.80 (*)    GFR, Estimated 43 (*)    All other components within normal limits  BRAIN NATRIURETIC PEPTIDE - Abnormal; Notable for the following components:   B Natriuretic Peptide 210.0 (*)    All other components within normal limits  URINALYSIS, ROUTINE W REFLEX MICROSCOPIC - Abnormal; Notable for the following components:   Color, Urine COLORLESS (*)    Specific Gravity, Urine 1.004 (*)    Glucose, UA 150 (*)    Hgb urine dipstick SMALL (*)    All other components within normal limits  CBG MONITORING, ED - Abnormal; Notable for the following components:   Glucose-Capillary 285 (*)    All other components within normal limits  TROPONIN I (HIGH SENSITIVITY) - Abnormal; Notable for the following components:   Troponin I (High Sensitivity) 40 (*)    All other components within normal limits  TROPONIN I (HIGH SENSITIVITY) - Abnormal; Notable for the following components:   Troponin I (High Sensitivity) 46 (*)    All other components within normal limits  RESP PANEL BY RT-PCR (FLU A&B, COVID) ARPGX2  CBC  RAPID URINE DRUG SCREEN, HOSP PERFORMED  CBG MONITORING, ED    EKG EKG Interpretation  Date/Time:  Monday April 13 2021 17:58:43 EDT Ventricular Rate:  73 PR Interval:  160 QRS Duration: 94 QT Interval:  408 QTC Calculation: 449 R Axis:   -22 Text Interpretation: Sinus rhythm with occasional Premature ventricular complexes Incomplete right bundle branch block Minimal voltage criteria for LVH, may be normal variant ( R in aVL ) Possible Anterior infarct , age undetermined Abnormal ECG Confirmed by Ripley Fraise [phone]) on 04/14/2021 12:24:14 AM  Radiology DG Chest 2 View  Result Date: 04/13/2021 CLINICAL DATA:  Chest  pain. EXAM: CHEST - 2 VIEW COMPARISON:  02/09/2021 FINDINGS: Lower lung volumes from prior exam. Cardiomegaly is grossly stable. There is central vascular congestion with slight increase in peribronchial thickening. Patchy bibasilar opacities. No pneumothorax or large pleural effusion. Thoracic spondylosis. IMPRESSION: 1. Cardiomegaly with central vascular congestion. Peribronchial thickening may be bronchitic or congestive. 2. Patchy bibasilar opacities favor atelectasis. Electronically Signed   By: Keith Rake M.D.   On: 04/13/2021 19:15    Procedures Procedures   Medications Ordered in ED Medications  albuterol (PROVENTIL) (2.5 MG/3ML) 0.083% nebulizer solution 5 mg (has no administration in time range)  ipratropium (ATROVENT) nebulizer solution 0.5 mg (has no administration in time range)  nitroGLYCERIN (NITROGLYN) 2 % ointment 1 inch (has no administration in time range)  methylPREDNISolone sodium succinate (SOLU-MEDROL) 125 mg/2 mL injection 125 mg (has no administration in time range)  albuterol (PROVENTIL) (2.5 MG/3ML) 0.083% nebulizer solution 5 mg (5 mg Nebulization Given 04/14/21 0116)  furosemide (LASIX) injection 40 mg (40 mg Intravenous Given 04/14/21 0112)    ED Course  I have reviewed the triage vital signs and the nursing notes.  Pertinent labs & imaging results that were available during my care of the patient were reviewed by me and considered in my medical decision making (see chart for details).  Clinical Course as of 04/14/21 0320  Tue Apr 14, 2021  0045 Troponin I (High Sensitivity)(!): 46 Increasing but not far from baseline [HM]  0248 SpO2(!): 88 % Pt placed on oxygen [HM]    Clinical Course User Index [HM] Marianne Golightly, Gwenlyn Perking   MDM Rules/Calculators/A&P  Presents to the emergency department with chest pain shortness of breath.  History of CHF, COPD, A. fib.  No leukocytosis.  Creatinine appears to be baseline.  Troponin  elevated however not far from baseline.  Additional lab work pending.  Concern patient may have rales on exam however unable to discern as he does have wheezing.  Will give breathing treatment and Lasix.  Suspect he will need admission.  2:30 AM Patient continues to have increased work of breathing.  He is still wheezing.  Will give additional breathing treatment.  3:16 AM BNP elevated.  BP (!) 153/105   Pulse 69   Temp 97.9 F (36.6 C) (Oral)   Resp (!) 28   SpO2 (!) 88%   Continues to have increased work of breathing.  Now with hypoxia.  Placed on 2 L via nasal cannula.  Nitroglycerin and steroids given.    Discussed with Dr. Marlowe Sax who will admit.  The patient was discussed with and evaluated by Dr. Christy Gentles who agrees with the treatment plan.   Final Clinical Impression(s) / ED Diagnoses Final diagnoses:  Chest pain, unspecified type  Acute on chronic congestive heart failure, unspecified heart failure type ([place])  COPD exacerbation ([place])  Syncope and collapse    Rx / DC Orders ED Discharge Orders     None        [place] Stegemann, Gwenlyn Perking 04/14/21 0322    Ripley Fraise, MD 04/14/21 [phone]

## 2021-04-14 NOTE — ED Notes (Signed)
Pt very unpleasant and refuses to take medication at this time, states "I am going to eat first!!".

## 2021-04-14 NOTE — Plan of Care (Signed)
  Problem: Education: Goal: Ability to demonstrate management of disease process will improve Outcome: Progressing   Problem: Education: Goal: Ability to verbalize understanding of medication therapies will improve Outcome: Progressing   

## 2021-04-14 NOTE — ED Notes (Signed)
ED TO INPATIENT HANDOFF REPORT  ED Nurse Name and Phone #: Cindie Laroche [phone]  S Name/Age/Gender Chris [name] 60 y.o. male Room/Bed: 039C/039C  Code Status   Code Status: Full Code  Home/SNF/Other Jail Patient oriented to: self, place, time, and situation Is this baseline? Yes      Chief Complaint CHF exacerbation ([place]) [I50.9]  Triage Note Pt from jail via EMS for eval of chest pain x 3-4 days. Also reports multiple syncopal episodes over the last two days, standing up and falling to the ground. 1 nitro and 324 ASA given by EMS without relief.    Allergies No Known Allergies  Level of Care/Admitting Diagnosis ED Disposition     ED Disposition  Admit   Condition  --   Comment  Hospital Area: [facility] [[id number]]  Level of Care: Telemetry Cardiac [[id number]]  May admit patient to [facility] or [facility] if equivalent level of care is available:: Yes  Covid Evaluation: Asymptomatic Screening Protocol (No Symptoms)  Diagnosis: CHF exacerbation [facility]) [[id number]]  Admitting Physician: Shela Leff [[id number]]  Attending Physician: Shela Leff [[id number]]  Estimated length of stay: past midnight tomorrow  Certification:: I certify this patient will need inpatient services for at least 2 midnights          B Medical/Surgery History Past Medical History:  Diagnosis Date   Arrhythmia    atrial flutter   CHF (congestive heart failure) ([place])    Chronic kidney disease    COPD (chronic obstructive pulmonary disease) ([place])    Coronary artery disease    Depression    Diabetes mellitus without complication ([place])    GERD (gastroesophageal reflux disease)    Gout    Hypertension    Influenza A with respiratory manifestations    Mental disorder    Past Surgical History:  Procedure Laterality Date   ANKLE SURGERY     CARDIAC CATHETERIZATION     CARDIOVERSION N/A 07/08/2020   Procedure: CARDIOVERSION;  Surgeon: Corey Skains, MD;  Location:  [facility] ORS;  Service: Cardiovascular;  Laterality: N/A;   HERNIA REPAIR     x2   SHOULDER SURGERY     TEE WITHOUT CARDIOVERSION N/A 07/08/2020   Procedure: TRANSESOPHAGEAL ECHOCARDIOGRAM (TEE);  Surgeon: Corey Skains, MD;  Location: [facility] ORS;  Service: Cardiovascular;  Laterality: N/A;     A IV Location/Drains/Wounds Patient Lines/Drains/Airways Status     Active Line/Drains/Airways     Name Placement date Placement time Site Days   Peripheral IV 04/14/21 20 G Right Antecubital 04/14/21  0101  Antecubital  less than 1            Intake/Output Last 24 hours  Intake/Output Summary (Last 24 hours) at 04/14/2021 1201 Last data filed at 04/14/2021 0728 Gross per 24 hour  Intake --  Output 900 ml  Net -900 ml    Labs/Imaging Results for orders placed or performed during the hospital encounter of 04/13/21 (from the past 48 hour(s))  Basic metabolic panel     Status: Abnormal   Collection Time: 04/13/21  6:42 PM  Result Value Ref Range   Sodium 137 135 - 145 mmol/L   Potassium 4.2 3.5 - 5.1 mmol/L   Chloride 105 98 - 111 mmol/L   CO2 23 22 - 32 mmol/L   Glucose, Bld 82 70 - 99 mg/dL    Comment: Glucose reference range applies only to samples taken after fasting for at least 8 hours.   BUN 26 (H)  6 - 20 mg/dL   Creatinine, Ser 1.80 (H) 0.61 - 1.24 mg/dL   Calcium 9.2 8.9 - 10.3 mg/dL   GFR, Estimated 43 (L) >60 mL/min    Comment: (NOTE) Calculated using the CKD-EPI Creatinine Equation (2021)    Anion gap 9 5 - 15    Comment: Performed at [facility] [address] [place], [place] [address]  CBC     Status: None   Collection Time: 04/13/21  6:42 PM  Result Value Ref Range   WBC 10.1 4.0 - 10.5 K/uL   RBC 5.09 4.22 - 5.81 MIL/uL   Hemoglobin 15.0 13.0 - 17.0 g/dL   HCT 47.3 39.0 - 52.0 %   MCV 92.9 80.0 - 100.0 fL   MCH 29.5 26.0 - 34.0 pg   MCHC 31.7 30.0 - 36.0 g/dL   RDW 15.2 11.5 - 15.5 %   Platelets 176 150 - 400 K/uL   nRBC 0.0 0.0 - 0.2 %     Comment: Performed at [facility], [place] [address] [place], [place] [address]  Troponin I (High Sensitivity)     Status: Abnormal   Collection Time: 04/13/21  6:42 PM  Result Value Ref Range   Troponin I (High Sensitivity) 40 (H) <18 ng/L    Comment: (NOTE) Elevated high sensitivity troponin I (hsTnI) values and significant  changes across serial measurements may suggest ACS but many other  chronic and acute conditions are known to elevate hsTnI results.  Refer to the "Links" section for chest pain algorithms and additional  guidance. Performed at [facility], [place] [address], [place], [place] [address]   Troponin I (High Sensitivity)     Status: Abnormal   Collection Time: 04/13/21 10:40 PM  Result Value Ref Range   Troponin I (High Sensitivity) 46 (H) <18 ng/L    Comment: (NOTE) Elevated high sensitivity troponin I (hsTnI) values and significant  changes across serial measurements may suggest ACS but many other  chronic and acute conditions are known to elevate hsTnI results.  Refer to the "Links" section for chest pain algorithms and additional  guidance. Performed at [facility], [place] [address] [place], [place] [address]   Urinalysis, Routine w reflex microscopic Urine, Clean Catch     Status: Abnormal   Collection Time: 04/14/21 12:44 AM  Result Value Ref Range   Color, Urine COLORLESS (A) YELLOW   APPearance CLEAR CLEAR   Specific Gravity, Urine 1.004 (L) 1.005 - 1.030   pH 6.0 5.0 - 8.0   Glucose, UA 150 (A) NEGATIVE mg/dL   Hgb urine dipstick SMALL (A) NEGATIVE   Bilirubin Urine NEGATIVE NEGATIVE   Ketones, ur NEGATIVE NEGATIVE mg/dL   Protein, ur NEGATIVE NEGATIVE mg/dL   Nitrite NEGATIVE NEGATIVE   Leukocytes,Ua NEGATIVE NEGATIVE   RBC / HPF 0-5 0 - 5 RBC/hpf   WBC, UA 0-5 0 - 5 WBC/hpf   Bacteria, UA NONE SEEN NONE SEEN    Comment: Performed at [facility] [address], [place], [place] [address]  Rapid urine drug screen (hospital  performed)     Status: None   Collection Time: 04/14/21 12:45 AM  Result Value Ref Range   Opiates NONE DETECTED NONE DETECTED   Cocaine NONE DETECTED NONE DETECTED   Benzodiazepines NONE DETECTED NONE DETECTED   Amphetamines NONE DETECTED NONE DETECTED   Tetrahydrocannabinol NONE DETECTED NONE DETECTED   Barbiturates NONE DETECTED NONE DETECTED    Comment: (  NOTE) DRUG SCREEN FOR MEDICAL PURPOSES ONLY.  IF CONFIRMATION IS NEEDED FOR ANY PURPOSE, NOTIFY LAB WITHIN 5 DAYS.  LOWEST DETECTABLE LIMITS FOR URINE DRUG SCREEN Drug Class                     Cutoff (ng/mL) Amphetamine and metabolites    1000 Barbiturate and metabolites    200 Benzodiazepine                 413 Tricyclics and metabolites     300 Opiates and metabolites        300 Cocaine and metabolites        300 THC                            50 Performed at [facility], [place] [address], [place], [place] [address]   Brain natriuretic peptide     Status: Abnormal   Collection Time: 04/14/21  1:00 AM  Result Value Ref Range   B Natriuretic Peptide 210.0 (H) 0.0 - 100.0 pg/mL    Comment: Performed at [facility] [address], [place], [place] [address]  Resp Panel by RT-PCR (Flu A&B, Covid) Nasopharyngeal Swab     Status: None   Collection Time: 04/14/21  1:26 AM   Specimen: Nasopharyngeal Swab; Nasopharyngeal(NP) swabs in vial transport medium  Result Value Ref Range   SARS Coronavirus 2 by RT PCR NEGATIVE NEGATIVE    Comment: (NOTE) SARS-CoV-2 target nucleic acids are NOT DETECTED.  The SARS-CoV-2 RNA is generally detectable in upper respiratory specimens during the acute phase of infection. The lowest concentration of SARS-CoV-2 viral copies this assay can detect is 138 copies/mL. A negative result does not preclude SARS-Cov-2 infection and should not be used as the sole basis for treatment or other patient management decisions. A negative result may occur with  improper specimen  collection/handling, submission of specimen other than nasopharyngeal swab, presence of viral mutation(s) within the areas targeted by this assay, and inadequate number of viral copies(<138 copies/mL). A negative result must be combined with clinical observations, patient history, and epidemiological information. The expected result is Negative.  Fact Sheet for Patients:  EntrepreneurPulse.com.au  Fact Sheet for Healthcare Providers:  IncredibleEmployment.be  This test is no t yet approved or cleared by the [place] FDA and  has been authorized for detection and/or diagnosis of SARS-CoV-2 by FDA under an Emergency Use Authorization (EUA). This EUA will remain  in effect (meaning this test can be used) for the duration of the COVID-19 declaration under Section 564(b)(1) of the Act, 21 U.S.C.section 360bbb-3(b)(1), unless the authorization is terminated  or revoked sooner.       Influenza A by PCR NEGATIVE NEGATIVE   Influenza B by PCR NEGATIVE NEGATIVE    Comment: (NOTE) The Xpert Xpress SARS-CoV-2/FLU/RSV plus assay is intended as an aid in the diagnosis of influenza from Nasopharyngeal swab specimens and should not be used as a sole basis for treatment. Nasal washings and aspirates are unacceptable for Xpert Xpress SARS-CoV-2/FLU/RSV testing.  Fact Sheet for Patients: EntrepreneurPulse.com.au  Fact Sheet for Healthcare Providers: IncredibleEmployment.be  This test is not yet approved or cleared by the [place] FDA and has been authorized for detection and/or diagnosis of SARS-CoV-2 by FDA under an Emergency Use Authorization (EUA). This EUA will remain in effect (meaning this test can be used) for the duration of the COVID-19 declaration under Section  564(b)(1) of the Act, 21 U.S.C. section 360bbb-3(b)(1), unless the authorization is terminated or revoked.  Performed at [facility], [place] [address], [place], [place] [address]   POC CBG, ED     Status: Abnormal   Collection Time: 04/14/21  1:49 AM  Result Value Ref Range   Glucose-Capillary 285 (H) 70 - 99 mg/dL    Comment: Glucose reference range applies only to samples taken after fasting for at least 8 hours.  CBG monitoring, ED     Status: Abnormal   Collection Time: 04/14/21  8:04 AM  Result Value Ref Range   Glucose-Capillary 198 (H) 70 - 99 mg/dL    Comment: Glucose reference range applies only to samples taken after fasting for at least 8 hours.  D-dimer, quantitative     Status: Abnormal   Collection Time: 04/14/21  9:45 AM  Result Value Ref Range   D-Dimer, Quant 0.73 (H) 0.00 - 0.50 ug/mL-FEU    Comment: (NOTE) At the manufacturer cut-off value of 0.5 g/mL FEU, this assay has a negative predictive value of 95-100%.This assay is intended for use in conjunction with a clinical pretest probability (PTP) assessment model to exclude pulmonary embolism (PE) and deep venous thrombosis (DVT) in outpatients suspected of PE or DVT. Results should be correlated with clinical presentation. Performed at [facility], [place] [address] [place], [name] [address]   Basic metabolic panel     Status: Abnormal   Collection Time: 04/14/21  9:45 AM  Result Value Ref Range   Sodium 133 (L) 135 - 145 mmol/L   Potassium 4.3 3.5 - 5.1 mmol/L   Chloride 99 98 - 111 mmol/L   CO2 24 22 - 32 mmol/L   Glucose, Bld 290 (H) 70 - 99 mg/dL    Comment: Glucose reference range applies only to samples taken after fasting for at least 8 hours.   BUN 22 (H) 6 - 20 mg/dL   Creatinine, Ser 2.01 (H) 0.61 - 1.24 mg/dL   Calcium 9.2 8.9 - 10.3 mg/dL   GFR, Estimated 38 (L) >60 mL/min    Comment: (NOTE) Calculated using the CKD-EPI Creatinine Equation (2021)    Anion gap 10 5 - 15    Comment: Performed at [facility] [address], [place], [place] [address]  CBG monitoring, ED     Status: Abnormal   Collection Time:  04/14/21 11:54 AM  Result Value Ref Range   Glucose-Capillary 236 (H) 70 - 99 mg/dL    Comment: Glucose reference range applies only to samples taken after fasting for at least 8 hours.   DG Chest 2 View  Result Date: 04/13/2021 CLINICAL DATA:  Chest pain. EXAM: CHEST - 2 VIEW COMPARISON:  02/09/2021 FINDINGS: Lower lung volumes from prior exam. Cardiomegaly is grossly stable. There is central vascular congestion with slight increase in peribronchial thickening. Patchy bibasilar opacities. No pneumothorax or large pleural effusion. Thoracic spondylosis. IMPRESSION: 1. Cardiomegaly with central vascular congestion. Peribronchial thickening may be bronchitic or congestive. 2. Patchy bibasilar opacities favor atelectasis. Electronically Signed   By: Keith Rake M.D.   On: 04/13/2021 19:15    Pending Labs Unresulted Labs (From admission, onward)    None       Vitals/Pain Today's Vitals   04/14/21 0700 04/14/21 0715 04/14/21 1000 04/14/21 1035  BP: (!) 131/110 140/84 (!) 159/101   Pulse: 78 72 82   Resp: (!) 22 19 (!) 25   Temp:  TempSrc:      SpO2: 94% 94% 95%   PainSc:    6     Isolation Precautions No active isolations  Medications Medications  ipratropium (ATROVENT) 0.02 % nebulizer solution (  Not Given 04/14/21 0333)  albuterol (PROVENTIL) (2.5 MG/3ML) 0.083% nebulizer solution (  Not Given 04/14/21 0333)  furosemide (LASIX) injection 40 mg (40 mg Intravenous Given 04/14/21 0827)  ipratropium-albuterol (DUONEB) 0.5-2.5 (3) MG/3ML nebulizer solution 3 mL (3 mLs Nebulization Patient Refused/Not Given 04/14/21 0938)  albuterol (PROVENTIL) (2.5 MG/3ML) 0.083% nebulizer solution 2.5 mg (has no administration in time range)  acetaminophen (TYLENOL) tablet 650 mg (650 mg Oral Given 04/14/21 0938)    Or  acetaminophen (TYLENOL) suppository 650 mg ( Rectal See Alternative 04/14/21 0938)  doxycycline (VIBRA-TABS) tablet 100 mg (100 mg Oral Given 04/14/21 0938)  insulin aspart  (novoLOG) injection 0-9 Units (2 Units Subcutaneous Given 04/14/21 0824)  insulin aspart (novoLOG) injection 0-5 Units (has no administration in time range)  budesonide (PULMICORT) nebulizer solution 0.25 mg (0.25 mg Nebulization Given 04/14/21 0823)  methylPREDNISolone sodium succinate (SOLU-MEDROL) 125 mg/2 mL injection 120 mg (120 mg Intravenous Given 04/14/21 0938)  albuterol (PROVENTIL) (2.5 MG/3ML) 0.083% nebulizer solution 5 mg (5 mg Nebulization Given 04/14/21 0116)  furosemide (LASIX) injection 40 mg (40 mg Intravenous Given 04/14/21 0112)  albuterol (PROVENTIL) (2.5 MG/3ML) 0.083% nebulizer solution 5 mg (5 mg Nebulization Given 04/14/21 0321)  ipratropium (ATROVENT) nebulizer solution 0.5 mg (0.5 mg Nebulization Given 04/14/21 0322)  nitroGLYCERIN (NITROGLYN) 2 % ointment 1 inch (1 inch Topical Given 04/14/21 0322)  methylPREDNISolone sodium succinate (SOLU-MEDROL) 125 mg/2 mL injection 125 mg (125 mg Intravenous Given 04/14/21 0321)    Mobility walks     Focused Assessments Neuro Assessment Handoff:  Swallow screen pass? Yes          Neuro Assessment:   Neuro Checks:      Last Documented NIHSS Modified Score:   Has TPA been given? No If patient is a Neuro Trauma and patient is going to OR before floor call report to [place] nurse: [phone] or [phone]   R Recommendations: See Admitting Provider Note  Report given to:   Additional Notes: Diplomatic Services operational officer at bedside

## 2021-04-15 DIAGNOSIS — N1832 Chronic kidney disease, stage 3b: Secondary | ICD-10-CM

## 2021-04-15 DIAGNOSIS — R55 Syncope and collapse: Secondary | ICD-10-CM

## 2021-04-15 DIAGNOSIS — J441 Chronic obstructive pulmonary disease with (acute) exacerbation: Secondary | ICD-10-CM

## 2021-04-15 LAB — BASIC METABOLIC PANEL
Anion gap: 8 (ref 5–15)
BUN: 34 mg/dL — ABNORMAL HIGH (ref 6–20)
CO2: 24 mmol/L (ref 22–32)
Calcium: 9.5 mg/dL (ref 8.9–10.3)
Chloride: 102 mmol/L (ref 98–111)
Creatinine, Ser: 2.12 mg/dL — ABNORMAL HIGH (ref 0.61–1.24)
GFR, Estimated: 35 mL/min — ABNORMAL LOW (ref >60)
Glucose, Bld: 136 mg/dL — ABNORMAL HIGH (ref 70–99)
Potassium: 4.8 mmol/L (ref 3.5–5.1)
Sodium: 134 mmol/L — ABNORMAL LOW (ref 135–145)

## 2021-04-15 LAB — CBC
HCT: 49.5 % (ref 39.0–52.0)
Hemoglobin: 16.4 g/dL (ref 13.0–17.0)
MCH: 29.4 pg (ref 26.0–34.0)
MCHC: 33.1 g/dL (ref 30.0–36.0)
MCV: 88.9 fL (ref 80.0–100.0)
Platelets: 206 10*3/uL (ref 150–400)
RBC: 5.57 MIL/uL (ref 4.22–5.81)
RDW: 15 % (ref 11.5–15.5)
WBC: 20.1 10*3/uL — ABNORMAL HIGH (ref 4.0–10.5)
nRBC: 0 % (ref 0.0–0.2)

## 2021-04-15 LAB — GLUCOSE, CAPILLARY
Glucose-Capillary: 136 mg/dL — ABNORMAL HIGH (ref 70–99)
Glucose-Capillary: 152 mg/dL — ABNORMAL HIGH (ref 70–99)
Glucose-Capillary: 253 mg/dL — ABNORMAL HIGH (ref 70–99)
Glucose-Capillary: 232 mg/dL — ABNORMAL HIGH (ref 70–99)

## 2021-04-15 MED ORDER — HYDROCOD POLST-CPM POLST ER 10-8 MG/5ML PO SUER
5.0000 mL | Freq: Two times a day (BID) | ORAL | Status: DC
  Administered 2021-04-15 – 2021-04-16 (×2): 5 mL via ORAL
  Filled 2021-04-15 (×2): qty 5

## 2021-04-15 MED ORDER — IPRATROPIUM-ALBUTEROL 0.5-2.5 (3) MG/3ML IN SOLN
3.0000 mL | Freq: Four times a day (QID) | RESPIRATORY_TRACT | Status: DC
  Administered 2021-04-15 (×4): 3 mL via RESPIRATORY_TRACT
  Filled 2021-04-15 (×4): qty 3

## 2021-04-15 MED ORDER — METHYLPREDNISOLONE SODIUM SUCC 125 MG IJ SOLR
80.0000 mg | INTRAMUSCULAR | Status: DC
  Administered 2021-04-16: 80 mg via INTRAVENOUS
  Filled 2021-04-15: qty 2

## 2021-04-15 MED ORDER — METHYLPREDNISOLONE SODIUM SUCC 40 MG IJ SOLR: 40.0000 mg | Freq: Two times a day (BID) | INTRAMUSCULAR | Status: DC

## 2021-04-15 MED ORDER — GUAIFENESIN-DM 100-10 MG/5ML PO SYRP: 5.0000 mL | ORAL_SOLUTION | ORAL | Status: DC | PRN

## 2021-04-15 MED ORDER — APIXABAN 5 MG PO TABS
5.0000 mg | ORAL_TABLET | Freq: Two times a day (BID) | ORAL | Status: DC
  Administered 2021-04-15 – 2021-04-16 (×2): 5 mg via ORAL
  Filled 2021-04-15 (×2): qty 1

## 2021-04-15 MED ORDER — IPRATROPIUM-ALBUTEROL 0.5-2.5 (3) MG/3ML IN SOLN: 3.0000 mL | RESPIRATORY_TRACT | Status: DC | PRN

## 2021-04-15 NOTE — TOC Initial Note (Signed)
Transition of Care [facility]) - Initial/Assessment Note    Patient Details  Name: Chris [name] MRN: [id number] Date of Birth: 06-07-1961  Transition of [facility]) CM/SW Contact:    Erenest Rasher, RN Phone Number: [phone] 04/15/2021, 4:40 PM  Clinical Narrative:                 HF TOC CM will arrange follow up appt at [facility]. DC meds from [place]. CSW working on [employer] transportation to [employer] and phone. Pt will be released on 11/3 from custody.   Expected Discharge Plan: Home/Self Care Barriers to Discharge: Continued Medical Work up   Patient Goals and CMS Choice        Expected Discharge Plan and Services Expected Discharge Plan: Home/Self Care In-house Referral: Clinical Social Work Discharge Planning Services: CM Consult   Living arrangements for the past 2 months: Apartment                                      Prior Living Arrangements/Services Living arrangements for the past 2 months: Apartment Lives with:: Self          Need for Family Participation in Patient Care: No (Comment) Care giver support system in place?: No (comment)   Criminal Activity/Legal Involvement Pertinent to Current Situation/Hospitalization: Yes - Comment as needed  Activities of Daily Living      Permission Sought/Granted Permission sought to share information with : Case Manager, Family Supports, PCP                Emotional Assessment Appearance:: Appears stated age     Orientation: : Oriented to Self, Oriented to Place, Oriented to  Time, Oriented to Situation   Psych Involvement: No (comment)  Admission diagnosis:  Syncope and collapse [R55] COPD exacerbation (HCC) [J44.1] CHF exacerbation ([place]) [I50.9] Chest pain, unspecified type [R07.9] Acute on chronic congestive heart failure, unspecified heart failure type ([place]) [I50.9] Patient Active Problem List   Diagnosis Date Noted   CHF exacerbation ([place]) 04/14/2021   Chest pain 04/14/2021   Syncope  04/14/2021   Left-sided weakness 10/28/2020   COPD exacerbation ([place])    Typical atrial flutter ([place])    Acute respiratory failure with hypoxia ([place]) 07/05/2020   CHF (congestive heart failure) ([place]) 07/04/2020   Acute exacerbation of CHF (congestive heart failure) ([place]) 06/16/2020   COPD with acute exacerbation ([place]) 06/16/2020   Influenza vaccine refused 05/06/2020   Acute on chronic combined systolic (congestive) and diastolic (congestive) heart failure ([place]) 05/05/2020   Acute decompensated heart failure ([place]) 05/04/2020   Illiteracy 05/04/2020   Chronic respiratory failure with hypoxia, on home oxygen therapy ([place]) 12/28/2019   Type 2 diabetes mellitus with stage 3 chronic kidney disease ([place]) 12/25/2019   Acute and chronic respiratory failure (acute-on-chronic) ([place]) 12/25/2019   Acute on chronic combined systolic and diastolic CHF (congestive heart failure) ([place]) 10/26/2019   Elevated troponin I level 10/26/2019   Acute on chronic diastolic (congestive) heart failure ([place]) 10/26/2019   History of gout 02/01/2019   Seasonal allergic rhinitis due to pollen 02/01/2019   Tobacco dependence 11/30/2018   Microscopic hematuria 11/30/2018   Depression 11/30/2018   Difficulty controlling anger 11/30/2018   CAP (community acquired pneumonia) 08/11/2018   COPD (chronic obstructive pulmonary disease) (HCC)    CKD (chronic kidney disease) stage 3, GFR 30-59 ml/min ([place]) 08/10/2018   Recurrent epistaxis 04/21/2018  Mixed hyperlipidemia 07/28/2017   Essential hypertension 74/25/9563   Chronic systolic heart failure ([place]) 10/25/2014   Cocaine abuse ([place]) 02/20/2013   Cannabis abuse 02/20/2013   Back pain, chronic 02/20/2013   PCP:  [name], MD Pharmacy:   [facility] [place] [address] Phone: [phone] Fax: [phone]  [facility] - [place], [place] - [address]  [place] [address] Phone: [phone] Fax: [phone]     Social Determinants of Health (SDOH) Interventions Food Insecurity Interventions: Intervention Not Indicated Financial Strain Interventions: Other (Comment) (HF TOC team) Housing Interventions: Intervention Not Indicated Transportation Interventions: [facility]  Readmission Risk Interventions Readmission Risk Prevention Plan 10/29/2019  Transportation Screening Complete  PCP or Specialist Appt within 3-5 Days Complete  HRI or [place] Complete  Social Work Consult for [place] Planning/Counseling Complete  Palliative Care Screening Not Applicable  Medication Review Press photographer) Complete  Some recent data might be hidden

## 2021-04-15 NOTE — Plan of Care (Signed)
  Problem: Education: Goal: Ability to demonstrate management of disease process will improve Outcome: Progressing Goal: Ability to verbalize understanding of medication therapies will improve Outcome: Progressing   

## 2021-04-15 NOTE — Progress Notes (Addendum)
ANTICOAGULATION CONSULT NOTE  Pharmacy Consult for Enoxaparin Indication:  AF  No Known Allergies  Patient Measurements: Height: 72 inches Total Body Weight: 122 kg  Vital Signs: Temp: 98 F (36.7 C) (11/02 0400) Temp Source: Oral (11/02 0400) BP: 147/85 (11/02 0400) Pulse Rate: 75 (11/02 0400)  Labs: Recent Labs    04/13/21 1842 04/13/21 2240 04/14/21 0945 04/14/21 1751 04/15/21 0322  HGB 15.0  --   --   --  16.4  HCT 47.3  --   --   --  49.5  PLT 176  --   --   --  206  HEPARINUNFRC  --   --   --  0.23*  --   CREATININE 1.80*  --  2.01*  --  2.12*  TROPONINIHS 40* 46*  --   --   --     Estimated Creatinine Clearance: 51.3 mL/min (A) (by C-G formula based on SCr of 2.12 mg/dL (H)).  Assessment: 60 yr old man admitted with CP and hx of multiple syncopal episodes from jail; med hx includes COPD, CAD, CHF, HTN, stage IIIB CKD, DM2, atrial fibrillation (on apixaban PTA, last dose unknown; pt does not recall taking apixaban on day of admission), polysubstance abuse, depression, gout, GERD, medication noncompliance. Pt admitted with acute respiratory failure with hypoxia/COPD exacerbation.  11/1 VQ scan very low probability for PE. Discussed with Dr. Cathlean Sauer to stop Lovenox and start pt's PTA Eliquis regimen for atrial fibrillation.  Goal of Therapy:  Monitor platelets by anticoagulation protocol: Yes   Plan:  Eliquis 5 mg PO BID Monitor for signs/symptoms of bleeding Pharmacy will monitor pt anticoagulation peripherally  Thank You, Chrisandra Carota, PharmD Candidate 04/15/2021,8:19 AM   Agreed with Alex's note. We will transition back to his PTA apixaban.  Onnie Boer, PharmD, BCIDP, AAHIVP, CPP Infectious Disease Pharmacist 04/15/2021 12:54 PM

## 2021-04-15 NOTE — Progress Notes (Signed)
PROGRESS NOTE    Chris [name]  QIH:[id number] DOB: [birth year] DOA: 04/13/2021 PCP: [name], MD    Brief Narrative:  Chris [name] was admitted to the hospital with the working diagnosis of COPD exacerbation.   60 yo male with the past medical history of COPD, CAD, chronic systolic heart failure, HT, CKD stage 3b, T2DM, paroxysmal atrial fibrillation and polysubstance abuse who presented with chest pain, dyspnea and syncope.  He [facility] to the ED due to chest pain and dyspnea.  Reported 3-day history of dyspnea on exertion and at rest.  Positive orthopnea.  Apparently coughing spells leading into syncope episodes.  On his initial physical examination his oximetry was 88% on room air, his blood pressure 157/107, heart rate 66, respiratory rate 16, temperature 97.9, lungs with wheezing and rales, heart S1-S2, present, rhythmic, soft abdomen, no lower extremity edema.  Sodium 133, potassium 4.3, chloride 99, bicarb 24, glucose 290, BUN 22, creatinine 2.0, D-dimer 0.73, SARS COVID-19 negative.  Urinalysis specific gravity 1.004. Toxicology screen negative.  Chest radiograph with bibasilar atelectasis.  EKG 101 bpm, left axis deviation, left anterior fascicular block, sinus rhythm, no significant ST segment or T wave changes.  Assessment & Plan:   Principal Problem:   CHF exacerbation (HCC) Active Problems:   CKD (chronic kidney disease) stage 3, GFR 30-59 ml/min (HCC)   COPD exacerbation (HCC)   Chest pain   Syncope   Acute hypoxemic respiratory failure due to COPD exacerbation. Patient today continue to have dyspnea and wheezing, positive cough and congestion.  Plan to continue medical therapy with systemic corticosteroids, bronchodilators, and antitussive agents. Continue inhaled corticosteroids and airway clearing techniques with flutter valve and incentive spirometer.   2. Acute on chronic diastolic heart failure. Patient more euvolemic  today. Continue home regimen with furosemide po. Continue blood pressure control.   3. CKD stage 3 a Renal function stable, continue close monitoring of  electrolytes.   4. Obesity class 2. Calculated BMI is 36.4   5. Paroxysmal atrial fibrillation. Continue anticoagulation with apixaban and rate control with carvedilol.   6. T2DM and dyslipidemia. Continue glucose control with insulin sliding scale. Continue with statin therapy.   Patient continue to be at high risk for worsening respiratory failure   Status is: Inpatient    DVT prophylaxis: Enoxaparin   Code Status:    full  Family Communication:   No family at the bedside       Subjective: Patient with no nausea or vomiting, no chest pain or further syncope episodes. Continue to have cough and congestion, positive dyspnea and wheezing.   Objective: Vitals:   04/14/21 1938 04/14/21 2000 04/15/21 0400 04/15/21 0500  BP:  (!) 145/90 (!) 147/85   Pulse:  82 75   Resp:   19   Temp:  98 F (36.7 C) 98 F (36.7 C)   TempSrc:  Oral Oral   SpO2: 96% 95% 95%   Weight:    122 kg  Height:        Intake/Output Summary (Last 24 hours) at 04/15/2021 1129 Last data filed at 04/15/2021 1100 Gross per 24 hour  Intake 240 ml  Output 1800 ml  Net -1560 ml   Filed Weights   04/14/21 1826 04/15/21 0500  Weight: 119.5 kg 122 kg    Examination:   General: , deconditioned  Neurology: Awake and alert, non focal  E ENT: mild pallor, no icterus, oral mucosa moist Cardiovascular: No JVD. S1-S2 present, rhythmic,  no gallops, rubs, or murmurs. No lower extremity edema. Pulmonary: positive breath sounds bilaterally with wheezing, but no rhonchi or rales. Gastrointestinal. Abdomen soft and non tender Skin. No rashes Musculoskeletal: no joint deformities     Data Reviewed: I have personally reviewed following labs and imaging studies  CBC: Recent Labs  Lab 04/13/21 1842 04/15/21 0322  WBC 10.1 20.1*  HGB 15.0 16.4  HCT  47.3 49.5  MCV 92.9 88.9  PLT 176 206   Basic Metabolic Panel: Recent Labs  Lab 04/13/21 1842 04/14/21 0945 04/15/21 0322  NA 137 133* 134*  K 4.2 4.3 4.8  CL 105 99 102  CO2 23 24 24   GLUCOSE 82 290* 136*  BUN 26* 22* 34*  CREATININE 1.80* 2.01* 2.12*  CALCIUM 9.2 9.2 9.5   GFR: Estimated Creatinine Clearance: 51.3 mL/min (A) (by C-G formula based on SCr of 2.12 mg/dL (H)). Liver Function Tests: No results for input(s): AST, ALT, ALKPHOS, BILITOT, PROT, ALBUMIN in the last 168 hours. No results for input(s): LIPASE, AMYLASE in the last 168 hours. No results for input(s): AMMONIA in the last 168 hours. Coagulation Profile: No results for input(s): INR, PROTIME in the last 168 hours. Cardiac Enzymes: No results for input(s): CKTOTAL, CKMB, CKMBINDEX, TROPONINI in the last 168 hours. BNP (last 3 results) No results for input(s): PROBNP in the last 8760 hours. HbA1C: No results for input(s): HGBA1C in the last 72 hours. CBG: Recent Labs  Lab 04/14/21 1154 04/14/21 1645 04/14/21 2049 04/15/21 0535 04/15/21 1118  GLUCAP 236* 197* 186* 136* 152*   Lipid Profile: No results for input(s): CHOL, HDL, LDLCALC, TRIG, CHOLHDL, LDLDIRECT in the last 72 hours. Thyroid Function Tests: No results for input(s): TSH, T4TOTAL, FREET4, T3FREE, THYROIDAB in the last 72 hours. Anemia Panel: No results for input(s): VITAMINB12, FOLATE, FERRITIN, TIBC, IRON, RETICCTPCT in the last 72 hours.    Radiology Studies: I have reviewed all of the imaging during this hospital visit personally     Scheduled Meds:  allopurinol  200 mg Oral Daily   apixaban  5 mg Oral BID   atorvastatin  40 mg Oral Daily   budesonide (PULMICORT) nebulizer solution  0.25 mg Nebulization BID   carvedilol  6.25 mg Oral BID   doxycycline  100 mg Oral Q12H   furosemide  40 mg Oral Daily   insulin aspart  0-5 Units Subcutaneous QHS   insulin aspart  0-9 Units Subcutaneous TID WC   ipratropium-albuterol  3  mL Nebulization QID   methylPREDNISolone (SOLU-MEDROL) injection  125 mg Intravenous Daily   montelukast  10 mg Oral QHS   Continuous Infusions:   LOS: 1 day        Chris [name] Chris Gula, MD

## 2021-04-15 NOTE — Plan of Care (Signed)
  Problem: Education: Goal: Ability to demonstrate management of disease process will improve 04/15/2021 1651 by Emmaline Life, RN Outcome: Progressing 04/15/2021 1048 by Emmaline Life, RN Outcome: Progressing Goal: Ability to verbalize understanding of medication therapies will improve Outcome: Progressing

## 2021-04-16 ENCOUNTER — Other Ambulatory Visit: Payer: Self-pay

## 2021-04-16 ENCOUNTER — Other Ambulatory Visit (HOSPITAL_COMMUNITY): Payer: Self-pay

## 2021-04-16 LAB — GLUCOSE, CAPILLARY
Glucose-Capillary: 133 mg/dL — ABNORMAL HIGH (ref 70–99)
Glucose-Capillary: 144 mg/dL — ABNORMAL HIGH (ref 70–99)

## 2021-04-16 MED ORDER — PREDNISONE 20 MG PO TABS
20.0000 mg | ORAL_TABLET | Freq: Every day | ORAL | 0 refills | Status: DC
  Filled 2021-04-16: qty 4, 4d supply, fill #0

## 2021-04-16 MED ORDER — PREDNISONE 20 MG PO TABS
20.0000 mg | ORAL_TABLET | Freq: Every day | ORAL | 0 refills | Status: AC
  Filled 2021-04-16: qty 4, 4d supply, fill #0

## 2021-04-16 MED ORDER — IPRATROPIUM-ALBUTEROL 0.5-2.5 (3) MG/3ML IN SOLN: 3.0000 mL | Freq: Two times a day (BID) | RESPIRATORY_TRACT | Status: DC

## 2021-04-16 MED ORDER — PREDNISONE 20 MG PO TABS: 20.0000 mg | ORAL_TABLET | Freq: Every day | ORAL | Status: DC

## 2021-04-16 MED ORDER — GUAIFENESIN-DM 100-10 MG/5ML PO SYRP: 5.0000 mL | ORAL_SOLUTION | Freq: Four times a day (QID) | ORAL | 0 refills | Status: DC | PRN

## 2021-04-16 MED ORDER — IPRATROPIUM-ALBUTEROL 0.5-2.5 (3) MG/3ML IN SOLN
3.0000 mL | Freq: Three times a day (TID) | RESPIRATORY_TRACT | Status: DC
  Administered 2021-04-16: 3 mL via RESPIRATORY_TRACT
  Filled 2021-04-16: qty 3

## 2021-04-16 NOTE — Progress Notes (Addendum)
Heart Failure Navigation Team Progress Note  PCP: [name], MD Primary Cardiologist: Rolena Infante., NP (pt no showed to scheduled appt) Admitted from: [facility]  Past Medical History:  Diagnosis Date   Arrhythmia    atrial flutter   CHF (congestive heart failure) ([place])    Chronic kidney disease    COPD (chronic obstructive pulmonary disease) ([place])    Coronary artery disease    Depression    Diabetes mellitus without complication ([place])    GERD (gastroesophageal reflux disease)    Gout    Hypertension    Influenza A with respiratory manifestations    Mental disorder     Social History   Socioeconomic History   Marital status: Divorced    Spouse name: Not on file   Number of children: 3   Years of education: Not on file   Highest education level: High school graduate  Occupational History   Occupation: disability  Tobacco Use   Smoking status: Every Day    Packs/day: 1.00    Years: 43.00    Pack years: 43.00    Types: Cigarettes   Smokeless tobacco: Never   Tobacco comments:    4 cigarettes/day x 2 month (04/14/2021)  Vaping Use   Vaping Use: Never used  Substance and Sexual Activity   Alcohol use: No   Drug use: Yes    Frequency: 21.0 times per week    Types: Marijuana, Cocaine    Comment: last use Cocaine- 03/28/2021. Last use marijuana 03/14/2021.   Sexual activity: Not on file  Other Topics Concern   Not on file  Social History Narrative   ** Merged History Encounter **       Social Determinants of Health   Financial Resource Strain: Low Risk    Difficulty of Paying Living Expenses: Not very hard  Food Insecurity: No Food Insecurity   Worried About Charity fundraiser in the Last Year: Never true   Arboriculturist in the Last Year: Never true  Transportation Needs: Unmet Transportation Needs   Lack of Transportation (Medical): Yes   Lack of Transportation (Non-Medical): Yes  Physical Activity: Not on file  Stress: Not on  file  Social Connections: Not on file     Heart & Vascular Transition of Care Clinic follow-up: Scheduled for 04/27/21 @11am .  Confirmed [facility] transportation is set up.  Immediate social needs: cell phone and size 3X/4X shirt at time of discharge  [place], MSW, [place] Social Worker

## 2021-04-16 NOTE — TOC Progression Note (Addendum)
Transition of Care [facility]) - Progression Note    Patient Details  Name: Chris [name] MRN: [id number] Date of Birth: [birth year]  Transition of Care [facility]) CM/SW [place], [place] Phone Number: 04/16/2021, 9:51 AM  Clinical Narrative:    HF CSW working on getting Chris [name] a cell phone.  Chris [name] also reported needing a size 3X/4X shirt at time of discharge.  CSW spoke with Chris [name] at bedside and provided him with a cell phone/tracfone phone number [phone]. Chris [name] asked several questions pertaining to his medications, home breathing machine, and something about narcotics or maybe detox which he must have spoken to the HF Navigator, Chris [name] about and CSW will have Chris [name] follow up with Chris [name].  PCP appointment scheduled with his primary care for the soonest available at [facility] 05/14/21 at 9:30am with Karle Plumber, MD. Information can be found on the patients AVS along with information about transportation if needed.  CSW will continue to follow throughout discharge.  Expected Discharge Plan: Home/Self Care Barriers to Discharge: Continued Medical Work up  Expected Discharge Plan and Services Expected Discharge Plan: Home/Self Care In-house Referral: Clinical Social Work Discharge Planning Services: CM Consult   Living arrangements for the past 2 months: Apartment                                       Social Determinants of Health (SDOH) Interventions Food Insecurity Interventions: Intervention Not Indicated Financial Strain Interventions: Other (Comment) (HF TOC team) Housing Interventions: Intervention Not Indicated Transportation Interventions: [facility]  Readmission Risk Interventions Readmission Risk Prevention Plan 10/29/2019  Transportation Screening Complete  PCP or Specialist Appt within 3-5 Days Complete  HRI or [place] Complete  Social Work Consult for [place]  Planning/Counseling Complete  Palliative Care Screening Not Applicable  Medication Review Press photographer) Complete  Some recent data might be hidden   [place], MSW, [place] Social Worker

## 2021-04-16 NOTE — TOC Progression Note (Signed)
Transition of Care [facility]) - Progression Note    Patient Details  Name: Chris [name] MRN: [id number] Date of Birth: December 22, 1960  [facility]) CM/SW Contact  Zenon Mayo, RN Phone Number: 04/16/2021, 2:22 PM  Clinical Narrative:    Patient for dc today, NCM asked MD to send prednisone to [place] to fill since patient has no way to go to [facility] to pick up medications since transport has to be set up to take him home.    Expected Discharge Plan: Home/Self Care Barriers to Discharge: Continued Medical Work up  Expected Discharge Plan and Services Expected Discharge Plan: Home/Self Care In-house Referral: Clinical Social Work Discharge Planning Services: CM Consult   Living arrangements for the past 2 months: Apartment Expected Discharge Date: 04/16/21                                     Social Determinants of Health (SDOH) Interventions Food Insecurity Interventions: Intervention Not Indicated Financial Strain Interventions: Other (Comment) (HF TOC team) Housing Interventions: Intervention Not Indicated Transportation Interventions: [facility]  Readmission Risk Interventions Readmission Risk Prevention Plan 10/29/2019  Transportation Screening Complete  PCP or Specialist Appt within 3-5 Days Complete  HRI or [place] Complete  Social Work Consult for [place] Planning/Counseling Complete  Palliative Care Screening Not Applicable  Medication Review Press photographer) Complete  Some recent data might be hidden

## 2021-04-16 NOTE — Discharge Summary (Addendum)
Physician Discharge Summary  Chris [name] YCX:[id number] DOB: 1961-01-31 DOA: 04/13/2021  PCP: Chris Pier, MD  Admit date: 04/13/2021 Discharge date: 04/16/2021  Admitted From: Home  Disposition:  Home   Recommendations for Outpatient Follow-up and new medication changes:  Follow up with Dr. Wynetta [name] in 7 to 10 days.  Continue bronchodilator therapy at home Continue with prednisone 20 mg daily for 4 more days.   Home Health: no   Equipment/Devices: no    Discharge Condition: stable  CODE STATUS: full  Diet recommendation:  heart healthy   Brief/Interim Summary: Chris [name] was admitted to the hospital with the working diagnosis of COPD exacerbation complicated with acute hypoxemic respiratory failure.    60 yo male with the past medical history of COPD, CAD, chronic systolic heart failure, HT, CKD stage 3b, T2DM, paroxysmal atrial fibrillation and polysubstance abuse who presented with chest pain, dyspnea and syncope.  [facility] to the ED due to chest pain and dyspnea.  Reported 3-day history of dyspnea on exertion and at rest.  Positive orthopnea.  Apparently coughing spells leading into syncope episodes.  On his initial physical examination his oximetry was 88% on room air, his blood pressure 157/107, heart rate 66, respiratory rate 16, temperature 97.9, lungs with wheezing and rales, heart S1-S2, present, rhythmic, soft abdomen, no lower extremity edema.   Sodium 133, potassium 4.3, chloride 99, bicarb 24, glucose 290, BUN 22, creatinine 2.0, D-dimer 0.73, SARS COVID-19 negative.   Urinalysis specific gravity 1.004. Toxicology screen negative.   Chest radiograph with bibasilar atelectasis.   EKG 101 bpm, left axis deviation, left anterior fascicular block, sinus rhythm, no significant ST segment or T wave changes.  Patient was placed on systemic steroids and aggressive bronchodilator therapy with improvement of his symptoms.   Acute hypoxic  respiratory failure due to COPD exacerbation.  Patient was admitted to the medical ward, she was placed on the remote telemetry monitor. He was treated with aggressive bronchodilator therapy, systemic corticosteroids and airway clearing techniques, flutter valve and incentive spirometry. Received antitussive agents and inhaled corticosteroids.  His symptoms improved, at discharge his oxygenation is 99% on room air. Plan to continue prednisone for 4 more days, prescription for new nebulizer machine has been ordered.  2.  Acute on chronic diastolic heart failure.  Patient received intravenous furosemide for volume overload with cough response. At discharge patient will continue his heart failure regimen, with carvedilol, losartan and dapagliflozin.   3.  Chronic kidney disease stage IIIa.  His kidney function remained at his baseline, 2.0.  At discharge is 2.12. No frank acute kidney injury. Continue oral furosemide per home regimen.  4.  Paroxysmal atrial fibrillation.  Continue rate control with carvedilol and anticoagulation with apixaban.  5.  Type 2 diabetes mellitus.  Dyslipidemia.  Patient was placed on insulin sliding scale for glucose coverage monitoring.  His glucose remained stable. Continue statin therapy.  6.  Obesity class II.  Calculated BMI 36.4.  Discharge Diagnoses:  Principal Problem:   CHF exacerbation ([place]) Active Problems:   CKD (chronic kidney disease) stage 3, GFR 30-59 ml/min (HCC)   COPD exacerbation (HCC)   Chest pain   Syncope    Discharge Instructions   Allergies as of 04/16/2021   No Known Allergies      Medication List     TAKE these medications    Accu-Chek Guide test strip Generic drug: glucose blood Use as directed to check blood sugar 1-2 times a day  Accu-Chek Guide w/Device Kit Use as directed   Accu-Chek Softclix Lancets lancets Use as instructed   albuterol (2.5 MG/3ML) 0.083% nebulizer solution Commonly known as:  PROVENTIL TAKE 3 MLS BY NEBULIZATION EVERY 6 (SIX) HOURS AS NEEDED FOR SHORTNESS OF BREATH. What changed:  how much to take how to take this when to take this reasons to take this   ProAir HFA 108 (90 Base) MCG/ACT inhaler Generic drug: albuterol INHALE 2 PUFFS INTO THE LUNGS EVERY 6 (SIX) HOURS AS NEEDED FOR WHEEZING OR SHORTNESS OF BREATH. What changed: Another medication with the same name was changed. Make sure you understand how and when to take each.   allopurinol 100 MG tablet Commonly known as: ZYLOPRIM TAKE 2 TABLETS (200 MG TOTAL) BY MOUTH DAILY. What changed: how much to take   aspirin 81 MG EC tablet Take 1 tablet (81 mg total) by mouth daily.   atorvastatin 40 MG tablet Commonly known as: LIPITOR Take 1 tablet (40 mg total) by mouth daily.   carvedilol 6.25 MG tablet Commonly known as: COREG Take 1 tablet (6.25 mg total) by mouth 2 (two) times daily.   colchicine 0.6 MG tablet Take 2 tabs (1.2 mg) at the onset of a gout flare, may repeat 1 tab (0.6 mg) after 2 hours if symptoms persist.   Eliquis 5 MG Tabs tablet Generic drug: apixaban TAKE 1 TABLET (5 MG TOTAL) BY MOUTH 2 (TWO) TIMES DAILY. What changed: how much to take   Farxiga 10 MG Tabs tablet Generic drug: dapagliflozin propanediol Take 1 tablet (10 mg total) by mouth daily.   furosemide 40 MG tablet Commonly known as: LASIX Take 1 tablet (40 mg total) by mouth daily.   glipiZIDE 5 MG tablet Commonly known as: GLUCOTROL Take 0.5 tablets (2.5 mg total) by mouth 2 (two) times daily before a meal.   guaiFENesin-dextromethorphan 100-10 MG/5ML syrup Commonly known as: ROBITUSSIN DM Take 5 mLs by mouth every 6 (six) hours as needed for cough.   losartan 100 MG tablet Commonly known as: COZAAR Take 100 mg by mouth daily.   montelukast 10 MG tablet Commonly known as: SINGULAIR TAKE 1 TABLET (10 MG TOTAL) BY MOUTH AT BEDTIME. What changed: how much to take   predniSONE 20 MG tablet Commonly  known as: DELTASONE Take 1 tablet (20 mg total) by mouth daily with breakfast for 4 days. Start taking on: April 17, 2021 What changed: how much to take   Symbicort 160-4.5 MCG/ACT inhaler Generic drug: budesonide-formoterol Inhale 2 puffs into the lungs 2 (two) times daily.   triamcinolone cream 0.1 % Commonly known as: KENALOG Apply 1 application topically 2 (two) times daily as needed (skin irritation on face).               Durable Medical Equipment  (From admission, onward)           Start     Ordered   04/16/21 0000  For home use only DME Nebulizer machine       Question Answer Comment  Patient needs a nebulizer to treat with the following condition Bronchitis   Length of Need 6 Months      04/16/21 1340            Follow-up Information     [place]. Go to.   Specialty: Cardiology Why: Monday, Nov 14 @ 11AM for HV TOC appt within [place]. Bring all medications with you. [employer]  Services: [phone] Contact information: [address] [phone] [place]        Chris Pier, MD. Go in 27 day(s).   Specialty: Internal Medicine Why: Primary Care Follow up Appointment 05/14/21 at 9:30am with Karle Plumber, MD for transportation please call [phone] Contact information: [place] [address] [phone]                No Known Allergies    Procedures/Studies: DG Chest 2 View  Result Date: 04/13/2021 CLINICAL DATA:  Chest pain. EXAM: CHEST - 2 VIEW COMPARISON:  02/09/2021 FINDINGS: Lower lung volumes from prior exam. Cardiomegaly is grossly stable. There is central vascular congestion with slight increase in peribronchial thickening. Patchy bibasilar opacities. No pneumothorax or large pleural effusion. Thoracic spondylosis. IMPRESSION: 1. Cardiomegaly with central vascular congestion.  Peribronchial thickening may be bronchitic or congestive. 2. Patchy bibasilar opacities favor atelectasis. Electronically Signed   By: Keith Rake M.D.   On: 04/13/2021 19:15   NM Pulmonary Perfusion  Result Date: 04/14/2021 CLINICAL DATA:  Positive D-dimer. EXAM: NUCLEAR MEDICINE PERFUSION LUNG SCAN TECHNIQUE: Perfusion images were obtained in multiple projections after intravenous injection of radiopharmaceutical. Ventilation scans intentionally deferred if perfusion scan and chest x-ray adequate for interpretation during COVID 19 epidemic. RADIOPHARMACEUTICALS:  4.4 mCi Tc-54m MAA IV COMPARISON:  Chest x-ray 04/14/2021. FINDINGS: There are no areas of photopenia to suggest pulmonary embolism. There is mild heterogeneity in the right lower lobe corresponding to atelectasis seen on x-ray. IMPRESSION: Very low probability for pulmonary embolism. Electronically Signed   By: Ronney Asters M.D.   On: 04/14/2021 20:37   DG CHEST PORT 1 VIEW  Result Date: 04/14/2021 CLINICAL DATA:  Chest pain. EXAM: PORTABLE CHEST 1 VIEW COMPARISON:  Chest radiograph dated 04/13/2021. FINDINGS: Cardiomegaly with vascular congestion. Bibasilar atelectasis. Pneumonia is not excluded clinical correlation is recommended. No large pleural effusion. No pneumothorax. Atherosclerotic calcification of the aorta. No acute osseous pathology. IMPRESSION: Cardiomegaly with vascular congestion. Pneumonia is not excluded. Electronically Signed   By: Anner Crete M.D.   On: 04/14/2021 19:29   ECHOCARDIOGRAM COMPLETE  Result Date: 04/14/2021    ECHOCARDIOGRAM REPORT   Patient Name:   UMAR PATMON Date of Exam: 04/14/2021 Medical Rec #:  [id number]       Height:       74.0 in Accession #:    [id number]      Weight:       279.5 lb Date of Birth:  04-27-1961       BSA:          2.506 m Patient Age:    60 years        BP:           140/84 mmHg Patient Gender: M               HR:           79 bpm. Exam Location:  Inpatient Procedure: 2D  Echo, Cardiac Doppler and Color Doppler Indications:    Syncope  History:        Patient has prior history of Echocardiogram examinations. CHF,                 COPD, Signs/Symptoms:Chest Pain; Risk Factors:Hypertension and                 Current Smoker.  Sonographer:    Jyl Heinz Referring Phys: [id number] Rosalie  1. Left ventricular  ejection fraction, by estimation, is 30 to 35%. The left ventricle has moderately decreased function. The left ventricle demonstrates global hypokinesis. The left ventricular internal cavity size was severely dilated. There is severe  asymmetric left ventricular hypertrophy. Left ventricular diastolic parameters are consistent with Grade I diastolic dysfunction (impaired relaxation).  2. Right ventricular systolic function is normal. The right ventricular size is normal.  3. The mitral valve is normal in structure. Trivial mitral valve regurgitation.  4. The aortic valve is normal in structure. Aortic valve regurgitation is not visualized. Comparison(s): The left ventricular hypertrophy has worsened. FINDINGS  Left Ventricle: Left ventricular ejection fraction, by estimation, is 30 to 35%. The left ventricle has moderately decreased function. The left ventricle demonstrates global hypokinesis. The left ventricular internal cavity size was severely dilated. There is severe asymmetric left ventricular hypertrophy. Left ventricular diastolic parameters are consistent with Grade I diastolic dysfunction (impaired relaxation). Right Ventricle: The right ventricular size is normal. Right vetricular wall thickness was not assessed. Right ventricular systolic function is normal. Left Atrium: Left atrial size was normal in size. Right Atrium: Right atrial size was normal in size. Pericardium: There is no evidence of pericardial effusion. Mitral Valve: The mitral valve is normal in structure. Trivial mitral valve regurgitation. Tricuspid Valve: The tricuspid valve is normal  in structure. Tricuspid valve regurgitation is trivial. Aortic Valve: The aortic valve is normal in structure. Aortic valve regurgitation is not visualized. Aortic valve peak gradient measures 7.3 mmHg. Pulmonic Valve: The pulmonic valve was not well visualized. Pulmonic valve regurgitation is not visualized. Aorta: The aortic root is normal in size and structure. IAS/Shunts: No atrial level shunt detected by color flow Doppler.  LEFT VENTRICLE PLAX 2D LVIDd:         6.16 cm      Diastology LVIDs:         5.03 cm      LV e' medial:    6.96 cm/s LV PW:         1.49 cm      LV E/e' medial:  6.7 LV IVS:        1.70 cm      LV e' lateral:   4.35 cm/s LVOT diam:     2.40 cm      LV E/e' lateral: 10.6 LV SV:         67 LV SV Index:   27 LVOT Area:     4.52 cm  LV Volumes (MOD) LV vol d, MOD A2C: 250.0 ml LV vol d, MOD A4C: 293.0 ml LV vol s, MOD A2C: 153.0 ml LV vol s, MOD A4C: 181.0 ml LV SV MOD A2C:     97.0 ml LV SV MOD A4C:     293.0 ml LV SV MOD BP:      106.0 ml RIGHT VENTRICLE RV Basal diam:  4.63 cm RV Mid diam:    3.69 cm RV S prime:     10.70 cm/s TAPSE (M-mode): 2.3 cm LEFT ATRIUM             Index        RIGHT ATRIUM           Index LA diam:        4.80 cm 1.92 cm/m   RA Area:     18.10 cm 7.22 cm/m LA Vol (A2C):   66.8 ml 26.66 ml/m LA Vol (A4C):   34.5 ml 13.77 ml/m LA Biplane Vol: 50.3 ml 20.07 ml/m  AORTIC  VALVE AV Area (Vmax): 2.99 cm AV Vmax:        135.00 cm/s AV Peak Grad:   7.3 mmHg LVOT Vmax:      89.20 cm/s LVOT Vmean:     66.200 cm/s LVOT VTI:       0.149 m  AORTA Ao Root diam: 3.60 cm Ao Asc diam:  3.40 cm MITRAL VALVE MV Area (PHT): 3.89 cm    SHUNTS MV Decel Time: 195 msec    Systemic VTI:  0.15 m MV E velocity: 46.30 cm/s  Systemic Diam: 2.40 cm MV A velocity: 69.00 cm/s MV E/A ratio:  0.67 Dorris Carnes MD Electronically signed by Dorris Carnes MD Signature Date/Time: 04/14/2021/5:10:20 PM    Final       Subjective: Patient is feeling better, his dyspnea has improved, no nausea or  vomiting, cough is better, no chest pain,   Discharge Exam: Vitals:   04/16/21 0439 04/16/21 1205  BP: (!) 134/96 (!) 168/98  Pulse: 74 79  Resp: 20 16  Temp: 97.9 F (36.6 C) 98 F (36.7 C)  SpO2: 98% 99%   Vitals:   04/15/21 1936 04/15/21 1959 04/16/21 0439 04/16/21 1205  BP:  (!) 163/95 (!) 134/96 (!) 168/98  Pulse:  79 74 79  Resp:  17 20 16   Temp:  98 F (36.7 C) 97.9 F (36.6 C) 98 F (36.7 C)  TempSrc:  Oral Oral   SpO2: 95% 98% 98% 99%  Weight:   121.3 kg   Height:        General: Not in pain or dyspnea  Neurology: Awake and alert, non focal  E ENT: no pallor, no icterus, oral mucosa moist Cardiovascular: No JVD. S1-S2 present, rhythmic, no gallops, rubs, or murmurs. No lower extremity edema. Pulmonary: positive breath sounds bilaterally, with no wheezing, or rhonchi scattetered rales. Gastrointestinal. Abdomen soft and non tender Skin. No rashes Musculoskeletal: no joint deformities   The results of significant diagnostics from this hospitalization (including imaging, microbiology, ancillary and laboratory) are listed below for reference.     Microbiology: Recent Results (from the past 240 hour(s))  Resp Panel by RT-PCR (Flu A&B, Covid) Nasopharyngeal Swab     Status: None   Collection Time: 04/14/21  1:26 AM   Specimen: Nasopharyngeal Swab; Nasopharyngeal(NP) swabs in vial transport medium  Result Value Ref Range Status   SARS Coronavirus 2 by RT PCR NEGATIVE NEGATIVE Final    Comment: (NOTE) SARS-CoV-2 target nucleic acids are NOT DETECTED.  The SARS-CoV-2 RNA is generally detectable in upper respiratory specimens during the acute phase of infection. The lowest concentration of SARS-CoV-2 viral copies this assay can detect is 138 copies/mL. A negative result does not preclude SARS-Cov-2 infection and should not be used as the sole basis for treatment or other patient management decisions. A negative result may occur with  improper specimen  collection/handling, submission of specimen other than nasopharyngeal swab, presence of viral mutation(s) within the areas targeted by this assay, and inadequate number of viral copies(<138 copies/mL). A negative result must be combined with clinical observations, patient history, and epidemiological information. The expected result is Negative.  Fact Sheet for Patients:  EntrepreneurPulse.com.au  Fact Sheet for Healthcare Providers:  IncredibleEmployment.be  This test is no t yet approved or cleared by the [place] FDA and  has been authorized for detection and/or diagnosis of SARS-CoV-2 by FDA under an Emergency Use Authorization (EUA). This EUA will remain  in effect (meaning this test can be used) for  the duration of the COVID-19 declaration under Section 564(b)(1) of the Act, 21 U.S.C.section 360bbb-3(b)(1), unless the authorization is terminated  or revoked sooner.       Influenza A by PCR NEGATIVE NEGATIVE Final   Influenza B by PCR NEGATIVE NEGATIVE Final    Comment: (NOTE) The Xpert Xpress SARS-CoV-2/FLU/RSV plus assay is intended as an aid in the diagnosis of influenza from Nasopharyngeal swab specimens and should not be used as a sole basis for treatment. Nasal washings and aspirates are unacceptable for Xpert Xpress SARS-CoV-2/FLU/RSV testing.  Fact Sheet for Patients: EntrepreneurPulse.com.au  Fact Sheet for Healthcare Providers: IncredibleEmployment.be  This test is not yet approved or cleared by the [place] FDA and has been authorized for detection and/or diagnosis of SARS-CoV-2 by FDA under an Emergency Use Authorization (EUA). This EUA will remain in effect (meaning this test can be used) for the duration of the COVID-19 declaration under Section 564(b)(1) of the Act, 21 U.S.C. section 360bbb-3(b)(1), unless the authorization is terminated or revoked.  Performed at [facility], [place] [address], [place], [place] [address]      Labs: BNP (last 3 results) Recent Labs    07/03/20 2031 02/09/21 0830 04/14/21 0100  BNP 1,617.4* 443.3* 165.5*   Basic Metabolic Panel: Recent Labs  Lab 04/13/21 1842 04/14/21 0945 04/15/21 0322  NA 137 133* 134*  K 4.2 4.3 4.8  CL 105 99 102  CO2 23 24 24   GLUCOSE 82 290* 136*  BUN 26* 22* 34*  CREATININE 1.80* 2.01* 2.12*  CALCIUM 9.2 9.2 9.5   Liver Function Tests: No results for input(s): AST, ALT, ALKPHOS, BILITOT, PROT, ALBUMIN in the last 168 hours. No results for input(s): LIPASE, AMYLASE in the last 168 hours. No results for input(s): AMMONIA in the last 168 hours. CBC: Recent Labs  Lab 04/13/21 1842 04/15/21 0322  WBC 10.1 20.1*  HGB 15.0 16.4  HCT 47.3 49.5  MCV 92.9 88.9  PLT 176 206   Cardiac Enzymes: No results for input(s): CKTOTAL, CKMB, CKMBINDEX, TROPONINI in the last 168 hours. BNP: Invalid input(s): POCBNP CBG: Recent Labs  Lab 04/15/21 1118 04/15/21 1610 04/15/21 2102 04/16/21 0605 04/16/21 1106  GLUCAP 152* 253* 232* 133* 144*   D-Dimer Recent Labs    04/14/21 0945  DDIMER 0.73*   Hgb A1c No results for input(s): HGBA1C in the last 72 hours. Lipid Profile No results for input(s): CHOL, HDL, LDLCALC, TRIG, CHOLHDL, LDLDIRECT in the last 72 hours. Thyroid function studies No results for input(s): TSH, T4TOTAL, T3FREE, THYROIDAB in the last 72 hours.  Invalid input(s): FREET3 Anemia work up No results for input(s): VITAMINB12, FOLATE, FERRITIN, TIBC, IRON, RETICCTPCT in the last 72 hours. Urinalysis    Component Value Date/Time   COLORURINE COLORLESS (A) 04/14/2021 0044   APPEARANCEUR CLEAR 04/14/2021 0044   APPEARANCEUR Clear 11/30/2018 1035   LABSPEC 1.004 (L) 04/14/2021 0044   PHURINE 6.0 04/14/2021 0044   GLUCOSEU 150 (A) 04/14/2021 0044   HGBUR SMALL (A) 04/14/2021 0044   BILIRUBINUR NEGATIVE 04/14/2021 0044   BILIRUBINUR Negative 11/30/2018 1035    KETONESUR NEGATIVE 04/14/2021 0044   PROTEINUR NEGATIVE 04/14/2021 0044   NITRITE NEGATIVE 04/14/2021 0044   LEUKOCYTESUR NEGATIVE 04/14/2021 0044   Sepsis Labs Invalid input(s): PROCALCITONIN,  WBC,  LACTICIDVEN Microbiology Recent Results (from the past 240 hour(s))  Resp Panel by RT-PCR (Flu A&B, Covid) Nasopharyngeal Swab     Status: None   Collection Time: 04/14/21  1:26 AM  Specimen: Nasopharyngeal Swab; Nasopharyngeal(NP) swabs in vial transport medium  Result Value Ref Range Status   SARS Coronavirus 2 by RT PCR NEGATIVE NEGATIVE Final    Comment: (NOTE) SARS-CoV-2 target nucleic acids are NOT DETECTED.  The SARS-CoV-2 RNA is generally detectable in upper respiratory specimens during the acute phase of infection. The lowest concentration of SARS-CoV-2 viral copies this assay can detect is 138 copies/mL. A negative result does not preclude SARS-Cov-2 infection and should not be used as the sole basis for treatment or other patient management decisions. A negative result may occur with  improper specimen collection/handling, submission of specimen other than nasopharyngeal swab, presence of viral mutation(s) within the areas targeted by this assay, and inadequate number of viral copies(<138 copies/mL). A negative result must be combined with clinical observations, patient history, and epidemiological information. The expected result is Negative.  Fact Sheet for Patients:  EntrepreneurPulse.com.au  Fact Sheet for Healthcare Providers:  IncredibleEmployment.be  This test is no t yet approved or cleared by the [place] FDA and  has been authorized for detection and/or diagnosis of SARS-CoV-2 by FDA under an Emergency Use Authorization (EUA). This EUA will remain  in effect (meaning this test can be used) for the duration of the COVID-19 declaration under Section 564(b)(1) of the Act, 21 U.S.C.section 360bbb-3(b)(1), unless the  authorization is terminated  or revoked sooner.       Influenza A by PCR NEGATIVE NEGATIVE Final   Influenza B by PCR NEGATIVE NEGATIVE Final    Comment: (NOTE) The Xpert Xpress SARS-CoV-2/FLU/RSV plus assay is intended as an aid in the diagnosis of influenza from Nasopharyngeal swab specimens and should not be used as a sole basis for treatment. Nasal washings and aspirates are unacceptable for Xpert Xpress SARS-CoV-2/FLU/RSV testing.  Fact Sheet for Patients: EntrepreneurPulse.com.au  Fact Sheet for Healthcare Providers: IncredibleEmployment.be  This test is not yet approved or cleared by the [place] FDA and has been authorized for detection and/or diagnosis of SARS-CoV-2 by FDA under an Emergency Use Authorization (EUA). This EUA will remain in effect (meaning this test can be used) for the duration of the COVID-19 declaration under Section 564(b)(1) of the Act, 21 U.S.C. section 360bbb-3(b)(1), unless the authorization is terminated or revoked.  Performed at [facility], [place] [address] [place], [place] [address]      Time coordinating discharge: 45 minutes  SIGNED:   Tawni Millers, MD  [facility] 04/16/2021, 1:17 PM

## 2021-04-17 ENCOUNTER — Telehealth: Payer: Self-pay

## 2021-04-17 ENCOUNTER — Telehealth: Payer: Self-pay | Admitting: *Deleted

## 2021-04-17 NOTE — Patient Outreach (Signed)
Care Coordination  04/17/2021  [name] [birth year] [id number]   Medicaid Managed Care   Unsuccessful Outreach Note  04/17/2021 Name: Chris [name] MRN: [id number] DOB: [birth year]  Referred by: [name], MD Reason for referral : High Risk Managed Medicaid (Unsuccessful telephone outreach/ hospital follow up)   An unsuccessful telephone outreach was attempted today. The patient was referred to the case management team for assistance with care management and care coordination.   Follow Up Plan: The care management team will reach out to the patient again over the next 7 days.   [name] RN, BSN [place]  [employer] manager

## 2021-04-17 NOTE — Telephone Encounter (Signed)
Transition Care Management Unsuccessful Follow-up Telephone Call   Date of discharge and from where:  04/16/2021 from [facility]   Attempts:  1 Attempt   Reason for unsuccessful TCM follow-up call:   Missing or invalid number.Called pt and the message stated is a Sheriff's number.

## 2021-04-17 NOTE — Patient Instructions (Signed)
Visit Information  [name]  - as a part of your Medicaid benefit, you are eligible for care management and care coordination services at no cost or copay. I was unable to reach you by phone today but would be happy to help you with your health related needs. Please feel free to call me @ [phone].   A member of the Managed Medicaid care management team will reach out to you again over the next 7 days.   [name] RN, BSN [place]  [employer] manager

## 2021-04-17 NOTE — Telephone Encounter (Signed)
Transition Care Management Unsuccessful Follow-up Telephone Call  Date of discharge and from where:  04/16/2021 from [facility]  Attempts:  1st Attempt  Reason for unsuccessful TCM follow-up call:  Missing or invalid number

## 2021-04-20 ENCOUNTER — Telehealth: Payer: Self-pay

## 2021-04-20 NOTE — Telephone Encounter (Signed)
Transition Care Management Unsuccessful Follow-up Telephone Call  Date of discharge and from where:  04/16/2021, [facility]   Attempts:  2nd Attempt  Reason for unsuccessful TCM follow-up call:  Unable to reach patient Call placed to # [phone], this is the phone number for [facility] office.  Call placed to # [phone], this is the phone patient was given at discharge and this phone just rings, no option to leave a message.  Patient has appointment with Dr Wynetta Emery @ [facility] - 04/24/2021

## 2021-04-20 NOTE — Telephone Encounter (Signed)
Transition Care Management Unsuccessful Follow-up Telephone Call  Date of discharge and from where:  04/16/2021 from [facility]  Attempts:  2nd Attempt  Reason for unsuccessful TCM follow-up call:  Missing or invalid number   .

## 2021-04-21 ENCOUNTER — Telehealth: Payer: Self-pay

## 2021-04-21 NOTE — Telephone Encounter (Signed)
Transition Care Management Unsuccessful Follow-up Telephone Call  Date of discharge and from where:  04/16/2021 from [facility]  Attempts:  3rd Attempt  Reason for unsuccessful TCM follow-up call:  Unable to reach patient

## 2021-04-21 NOTE — Telephone Encounter (Signed)
Transition Care Management Unsuccessful Follow-up Telephone Call  Date of discharge and from where:  04/16/2021, [facility]   Attempts:  3rd Attempt  Reason for unsuccessful TCM follow-up call:  Unable to reach patient  Call placed to # [phone], this is the phone number for [facility] office.   Call placed to # [phone], this is the phone patient was given at discharge and this phone just rings, no option to leave a message.   Patient has appointment with Dr Wynetta Emery @ [facility] - 04/24/2021

## 2021-04-24 ENCOUNTER — Inpatient Hospital Stay: Payer: Medicaid Other | Admitting: Internal Medicine

## 2021-04-27 ENCOUNTER — Emergency Department (HOSPITAL_COMMUNITY): Payer: Medicaid Other

## 2021-04-27 ENCOUNTER — Telehealth (HOSPITAL_COMMUNITY): Payer: Self-pay

## 2021-04-27 ENCOUNTER — Emergency Department (HOSPITAL_COMMUNITY)
Admission: EM | Admit: 2021-04-27 | Discharge: 2021-04-27 | Discharge status: Against Medical Advice | Payer: Medicaid Other | Attending: Emergency Medicine | Admitting: Emergency Medicine

## 2021-04-27 ENCOUNTER — Ambulatory Visit (HOSPITAL_COMMUNITY): Admit: 2021-04-27

## 2021-04-27 ENCOUNTER — Other Ambulatory Visit: Payer: Self-pay

## 2021-04-27 DIAGNOSIS — Z7982 Long term (current) use of aspirin: Secondary | ICD-10-CM | POA: Diagnosis not present

## 2021-04-27 DIAGNOSIS — E1122 Type 2 diabetes mellitus with diabetic chronic kidney disease: Secondary | ICD-10-CM | POA: Insufficient documentation

## 2021-04-27 DIAGNOSIS — N183 Chronic kidney disease, stage 3 unspecified: Secondary | ICD-10-CM | POA: Diagnosis not present

## 2021-04-27 DIAGNOSIS — I517 Cardiomegaly: Secondary | ICD-10-CM | POA: Diagnosis not present

## 2021-04-27 DIAGNOSIS — I130 Hypertensive heart and chronic kidney disease with heart failure and stage 1 through stage 4 chronic kidney disease, or unspecified chronic kidney disease: Secondary | ICD-10-CM | POA: Diagnosis not present

## 2021-04-27 DIAGNOSIS — Z7951 Long term (current) use of inhaled steroids: Secondary | ICD-10-CM | POA: Insufficient documentation

## 2021-04-27 DIAGNOSIS — R079 Chest pain, unspecified: Secondary | ICD-10-CM | POA: Diagnosis not present

## 2021-04-27 DIAGNOSIS — F17210 Nicotine dependence, cigarettes, uncomplicated: Secondary | ICD-10-CM | POA: Insufficient documentation

## 2021-04-27 DIAGNOSIS — J449 Chronic obstructive pulmonary disease, unspecified: Secondary | ICD-10-CM | POA: Insufficient documentation

## 2021-04-27 DIAGNOSIS — Z7901 Long term (current) use of anticoagulants: Secondary | ICD-10-CM | POA: Diagnosis not present

## 2021-04-27 DIAGNOSIS — R0602 Shortness of breath: Secondary | ICD-10-CM

## 2021-04-27 DIAGNOSIS — I5043 Acute on chronic combined systolic (congestive) and diastolic (congestive) heart failure: Secondary | ICD-10-CM | POA: Insufficient documentation

## 2021-04-27 DIAGNOSIS — R1012 Left upper quadrant pain: Secondary | ICD-10-CM | POA: Insufficient documentation

## 2021-04-27 DIAGNOSIS — I700 Atherosclerosis of aorta: Secondary | ICD-10-CM | POA: Diagnosis not present

## 2021-04-27 DIAGNOSIS — J9811 Atelectasis: Secondary | ICD-10-CM | POA: Insufficient documentation

## 2021-04-27 DIAGNOSIS — Z7984 Long term (current) use of oral hypoglycemic drugs: Secondary | ICD-10-CM | POA: Insufficient documentation

## 2021-04-27 DIAGNOSIS — R1013 Epigastric pain: Secondary | ICD-10-CM | POA: Insufficient documentation

## 2021-04-27 DIAGNOSIS — J189 Pneumonia, unspecified organism: Secondary | ICD-10-CM | POA: Diagnosis not present

## 2021-04-27 DIAGNOSIS — R0789 Other chest pain: Secondary | ICD-10-CM

## 2021-04-27 DIAGNOSIS — Z20822 Contact with and (suspected) exposure to covid-19: Secondary | ICD-10-CM | POA: Insufficient documentation

## 2021-04-27 LAB — COMPREHENSIVE METABOLIC PANEL
ALT: 23 U/L (ref 0–44)
AST: 22 U/L (ref 15–41)
Albumin: 3.6 g/dL (ref 3.5–5.0)
Alkaline Phosphatase: 68 U/L (ref 38–126)
Anion gap: 7 (ref 5–15)
BUN: 20 mg/dL (ref 6–20)
CO2: 25 mmol/L (ref 22–32)
Calcium: 9 mg/dL (ref 8.9–10.3)
Chloride: 105 mmol/L (ref 98–111)
Creatinine, Ser: 1.65 mg/dL — ABNORMAL HIGH (ref 0.61–1.24)
GFR, Estimated: 48 mL/min — ABNORMAL LOW (ref >60)
Glucose, Bld: 99 mg/dL (ref 70–99)
Potassium: 5 mmol/L (ref 3.5–5.1)
Sodium: 137 mmol/L (ref 135–145)
Total Bilirubin: 0.6 mg/dL (ref 0.3–1.2)
Total Protein: 6.8 g/dL (ref 6.5–8.1)

## 2021-04-27 LAB — RESP PANEL BY RT-PCR (FLU A&B, COVID) ARPGX2
Influenza A by PCR: NEGATIVE
Influenza B by PCR: NEGATIVE
SARS Coronavirus 2 by RT PCR: NEGATIVE

## 2021-04-27 LAB — TROPONIN I (HIGH SENSITIVITY)
Troponin I (High Sensitivity): 39 ng/L — ABNORMAL HIGH (ref <18)
Troponin I (High Sensitivity): 36 ng/L — ABNORMAL HIGH (ref <18)

## 2021-04-27 LAB — CBC
HCT: 49.7 % (ref 39.0–52.0)
Hemoglobin: 15.5 g/dL (ref 13.0–17.0)
MCH: 29.3 pg (ref 26.0–34.0)
MCHC: 31.2 g/dL (ref 30.0–36.0)
MCV: 94 fL (ref 80.0–100.0)
Platelets: 150 10*3/uL (ref 150–400)
RBC: 5.29 MIL/uL (ref 4.22–5.81)
RDW: 15.7 % — ABNORMAL HIGH (ref 11.5–15.5)
WBC: 13.2 10*3/uL — ABNORMAL HIGH (ref 4.0–10.5)
nRBC: 0 % (ref 0.0–0.2)

## 2021-04-27 LAB — BRAIN NATRIURETIC PEPTIDE: B Natriuretic Peptide: 272 pg/mL — ABNORMAL HIGH (ref 0.0–100.0)

## 2021-04-27 LAB — LIPASE, BLOOD: Lipase: 26 U/L (ref 11–51)

## 2021-04-27 MED ORDER — ASPIRIN EC 81 MG PO TBEC: 81.0000 mg | DELAYED_RELEASE_TABLET | Freq: Every day | ORAL | Status: DC

## 2021-04-27 MED ORDER — ALBUTEROL SULFATE (2.5 MG/3ML) 0.083% IN NEBU: 5.0000 mg | INHALATION_SOLUTION | Freq: Once | RESPIRATORY_TRACT | Status: DC

## 2021-04-27 MED ORDER — CARVEDILOL 3.125 MG PO TABS: 6.2500 mg | ORAL_TABLET | Freq: Two times a day (BID) | ORAL | Status: DC

## 2021-04-27 MED ORDER — APIXABAN 5 MG PO TABS: 5.0000 mg | ORAL_TABLET | Freq: Two times a day (BID) | ORAL | Status: DC

## 2021-04-27 MED ORDER — DEXAMETHASONE SODIUM PHOSPHATE 10 MG/ML IJ SOLN: 10.0000 mg | Freq: Once | INTRAMUSCULAR | Status: DC

## 2021-04-27 NOTE — ED Provider Notes (Signed)
[facility] EMERGENCY DEPARTMENT Provider Note   CSN: [id number] Arrival date & time: 04/27/21  8299     History Chief Complaint  Patient presents with   Abdominal Pain   Chest Pain   Generalized Body Aches    BLAND Chris [name] is a 60 y.o. male.  HPI Patient presents about 1 week after evaluation, admission here now with concern for pain in the epigastrium, left upper abdomen, left lower chest. Acknowledges multiple medical issues including COPD, ongoing cigarette use, CHF, diabetes, hypertension.  He also states that he cooked cocaine within the past days, and has used it recently. Since discharge he notes that he is having persistent discomfort, but over the past 3 days it has become worse, focally in the aforementioned area.  No fever, no vomiting, no diarrhea, no other abdominal or chest pain.  There is dyspnea, however, which is worse with exertion. Chart review notable for admission for chest pain, dyspnea, findings consistent with worsening heart failure.  Patient had evaluation including nuclear medicine perfusion study within the past 2 weeks.  That study was unremarkable.    Past Medical History:  Diagnosis Date   Arrhythmia    atrial flutter   CHF (congestive heart failure) (HCC)    Chronic kidney disease    COPD (chronic obstructive pulmonary disease) ([place])    Coronary artery disease    Depression    Diabetes mellitus without complication ([place])    GERD (gastroesophageal reflux disease)    Gout    Hypertension    Influenza A with respiratory manifestations    Mental disorder     Patient Active Problem List   Diagnosis Date Noted   CHF exacerbation ([place]) 04/14/2021   Chest pain 04/14/2021   Syncope 04/14/2021   Left-sided weakness 10/28/2020   COPD exacerbation ([place])    Typical atrial flutter ([place])    Acute respiratory failure with hypoxia ([place]) 07/05/2020   CHF (congestive heart failure) ([place]) 07/04/2020   Acute exacerbation of CHF  (congestive heart failure) ([place]) 06/16/2020   COPD with acute exacerbation ([place]) 06/16/2020   Influenza vaccine refused 05/06/2020   Acute on chronic combined systolic (congestive) and diastolic (congestive) heart failure ([place]) 05/05/2020   Acute decompensated heart failure ([place]) 05/04/2020   Illiteracy 05/04/2020   Chronic respiratory failure with hypoxia, on home oxygen therapy ([place]) 12/28/2019   Type 2 diabetes mellitus with stage 3 chronic kidney disease ([place]) 12/25/2019   Acute and chronic respiratory failure (acute-on-chronic) ([place]) 12/25/2019   Acute on chronic combined systolic and diastolic CHF (congestive heart failure) ([place]) 10/26/2019   Elevated troponin I level 10/26/2019   Acute on chronic diastolic (congestive) heart failure ([place]) 10/26/2019   History of gout 02/01/2019   Seasonal allergic rhinitis due to pollen 02/01/2019   Tobacco dependence 11/30/2018   Microscopic hematuria 11/30/2018   Depression 11/30/2018   Difficulty controlling anger 11/30/2018   CAP (community acquired pneumonia) 08/11/2018   COPD (chronic obstructive pulmonary disease) ([place])    CKD (chronic kidney disease) stage 3, GFR 30-59 ml/min ([place]) 08/10/2018   Recurrent epistaxis 04/21/2018   Mixed hyperlipidemia 07/28/2017   Essential hypertension 37/16/9678   Chronic systolic heart failure ([place]) 10/25/2014   Cocaine abuse ([place]) 02/20/2013   Cannabis abuse 02/20/2013   Back pain, chronic 02/20/2013    Past Surgical History:  Procedure Laterality Date   ANKLE SURGERY     CARDIAC CATHETERIZATION     CARDIOVERSION N/A 07/08/2020   Procedure: CARDIOVERSION;  Surgeon: [name]  J, MD;  Location: [facility] ORS;  Service: Cardiovascular;  Laterality: N/A;   HERNIA REPAIR     x2   SHOULDER SURGERY     TEE WITHOUT CARDIOVERSION N/A 07/08/2020   Procedure: TRANSESOPHAGEAL ECHOCARDIOGRAM (TEE);  Surgeon: Corey Skains, MD;  Location: [facility] ORS;  Service: Cardiovascular;  Laterality: N/A;        Family History  Problem Relation Age of Onset   Heart disease Father    Diabetes Mother    HIV Brother    Healthy Son    Healthy Daughter     Social History   Tobacco Use   Smoking status: Every Day    Packs/day: 1.00    Years: 43.00    Pack years: 43.00    Types: Cigarettes   Smokeless tobacco: Never   Tobacco comments:    4 cigarettes/day x 2 month (04/14/2021)  Vaping Use   Vaping Use: Never used  Substance Use Topics   Alcohol use: No   Drug use: Yes    Frequency: 21.0 times per week    Types: Marijuana, Cocaine    Comment: last use Cocaine- 03/28/2021. Last use marijuana 03/14/2021.    Home Medications Prior to Admission medications   Medication Sig Start Date End Date Taking? Authorizing Provider  albuterol (PROVENTIL) (2.5 MG/3ML) 0.083% nebulizer solution TAKE 3 MLS BY NEBULIZATION EVERY 6 (SIX) HOURS AS NEEDED FOR SHORTNESS OF BREATH. Patient taking differently: Take 2.5 mg by nebulization every 6 (six) hours as needed for shortness of breath or wheezing. 11/30/20 11/30/21 Yes [name], MD  albuterol (VENTOLIN HFA) 108 (90 Base) MCG/ACT inhaler INHALE 2 PUFFS INTO THE LUNGS EVERY 6 (SIX) HOURS AS NEEDED FOR WHEEZING OR SHORTNESS OF BREATH. 11/30/20  Yes [name], MD  allopurinol (ZYLOPRIM) 100 MG tablet TAKE 2 TABLETS (200 MG TOTAL) BY MOUTH DAILY. Patient taking differently: Take 200 mg by mouth daily. 11/30/20 11/30/21 Yes [name], MD  apixaban (ELIQUIS) 5 MG TABS tablet TAKE 1 TABLET (5 MG TOTAL) BY MOUTH 2 (TWO) TIMES DAILY. Patient taking differently: Take 5 mg by mouth 2 (two) times daily. 12/01/20 12/01/21 Yes [name], MD  aspirin 81 MG EC tablet Take 1 tablet (81 mg total) by mouth daily. 11/30/20  Yes [name], MD  atorvastatin (LIPITOR) 40 MG tablet Take 1 tablet (40 mg total) by mouth daily. 11/30/20  Yes [name], MD  budesonide-formoterol [facility]) 160-4.5 MCG/ACT inhaler Inhale 2 puffs  into the lungs 2 (two) times daily. 11/30/20  Yes [name], MD  carvedilol (COREG) 6.25 MG tablet Take 1 tablet (6.25 mg total) by mouth 2 (two) times daily. 12/01/20  Yes [name], FNP  dapagliflozin propanediol (FARXIGA) 10 MG TABS tablet Take 1 tablet (10 mg total) by mouth daily. Patient taking differently: Take 10 mg by mouth in the morning and at bedtime. 12/01/20  Yes [name], FNP  furosemide (LASIX) 40 MG tablet Take 1 tablet (40 mg total) by mouth daily. 12/01/20 12/01/21 Yes Alisa Graff, FNP  glipiZIDE (GLUCOTROL) 5 MG tablet Take 0.5 tablets (2.5 mg total) by mouth 2 (two) times daily before a meal. 11/30/20 11/30/21 Yes [name], MD  isosorbide-hydrALAZINE (BIDIL) 20-37.5 MG tablet Take 1 tablet by mouth 3 (three) times daily.   Yes [provider]  montelukast (SINGULAIR) 10 MG tablet TAKE 1 TABLET (10 MG TOTAL) BY MOUTH AT BEDTIME. Patient taking differently: Take 10 mg by mouth at  bedtime. 12/29/20 12/29/21 Yes [name], MD  Accu-Chek Softclix Lancets lancets Use as instructed 12/12/20   [name], MD  Blood Glucose Monitoring Suppl (ACCU-CHEK GUIDE) w/Device KIT Use as directed 12/12/20   [name], MD  colchicine 0.6 MG tablet Take 2 tabs (1.2 mg) at the onset of a gout flare, may repeat 1 tab (0.6 mg) after 2 hours if symptoms persist. 11/30/20   [name], MD  glucose blood (ACCU-CHEK GUIDE) test strip Use as directed to check blood sugar 1-2 times a day 12/12/20   [name], MD  guaiFENesin-dextromethorphan (ROBITUSSIN DM) 100-10 MG/5ML syrup Take 5 mLs by mouth every 6 (six) hours as needed for cough. Patient not taking: No sig reported 04/16/21   Arrien, Jimmy Picket, MD  triamcinolone cream (KENALOG) 0.1 % Apply 1 application topically 2 (two) times daily as needed (skin irritation on face). Patient not taking: No sig reported 11/12/20   [name], PA-C  metoprolol tartrate (LOPRESSOR) 100 MG  tablet Take 1 tablet (100 mg total) by mouth 2 (two) times daily. 07/09/20 07/11/20  [name], MD    Allergies    Patient has no known allergies.  Review of Systems   Review of Systems  Constitutional:        Per HPI, otherwise negative  HENT:         Per HPI, otherwise negative  Respiratory:         Per HPI, otherwise negative  Cardiovascular:        Per HPI, otherwise negative  Gastrointestinal:  Negative for vomiting.  Endocrine:       Negative aside from HPI  Genitourinary:        Neg aside from HPI   Musculoskeletal:        Per HPI, otherwise negative  Skin: Negative.   Neurological:  Negative for syncope.   Physical Exam Updated Vital Signs BP (!) 147/95   Pulse 65   Temp 98.3 F (36.8 C) (Oral)   Resp 19   SpO2 99%   Physical Exam Vitals and nursing note reviewed.  Constitutional:      General: He is not in acute distress.    Appearance: He is well-developed.  HENT:     Head: Normocephalic and atraumatic.  Eyes:     Conjunctiva/sclera: Conjunctivae normal.  Cardiovascular:     Rate and Rhythm: Normal rate and regular rhythm.  Pulmonary:     Effort: Pulmonary effort is normal. No respiratory distress.     Breath sounds: No stridor. Wheezing present.  Abdominal:     General: There is no distension.     Tenderness: There is no abdominal tenderness. There is no guarding.  Skin:    General: Skin is warm and dry.  Neurological:     Mental Status: He is alert and oriented to person, place, and time.    ED Results / Procedures / Treatments   Labs (all labs ordered are listed, but only abnormal results are displayed) Labs Reviewed  CBC - Abnormal; Notable for the following components:      Result Value   WBC 13.2 (*)    RDW 15.7 (*)    All other components within normal limits  COMPREHENSIVE METABOLIC PANEL - Abnormal; Notable for the following components:   Creatinine, Ser 1.65 (*)    GFR, Estimated 48 (*)    All other components within normal  limits  BRAIN NATRIURETIC PEPTIDE - Abnormal; Notable for the  following components:   B Natriuretic Peptide 272.0 (*)    All other components within normal limits  TROPONIN I (HIGH SENSITIVITY) - Abnormal; Notable for the following components:   Troponin I (High Sensitivity) 39 (*)    All other components within normal limits  TROPONIN I (HIGH SENSITIVITY) - Abnormal; Notable for the following components:   Troponin I (High Sensitivity) 36 (*)    All other components within normal limits  RESP PANEL BY RT-PCR (FLU A&B, COVID) ARPGX2  LIPASE, BLOOD    EKG Sinus rhythm, PVC, nonspecific T wave changes, artifact, rate 70, abnormal EKG  Radiology DG Chest 2 View  Result Date: 04/27/2021 CLINICAL DATA:  Chest pain EXAM: CHEST - 2 VIEW COMPARISON:  Previous studies including the examination of 04/14/2021 FINDINGS: Transverse diameter of heart is increased. There is poor inspiration. There are no signs of alveolar pulmonary edema or new focal infiltrates. There is interval improvement in aeration of right lower lung fields. There is subtle increased density in the medial left lower lung fields. There is no significant pleural effusion or pneumothorax. IMPRESSION: Cardiomegaly. There are no signs of alveolar pulmonary edema. Increased markings in the medial left lower lung fields may suggest crowding of normal bronchovascular structures or subsegmental atelectasis. There is interval improvement in aeration of right lower lung fields. Electronically Signed   By: Elmer Picker M.D.   On: 04/27/2021 10:55    Procedures Procedures   Medications Ordered in ED Medications  albuterol (PROVENTIL) (2.5 MG/3ML) 0.083% nebulizer solution 5 mg (has no administration in time range)  dexamethasone (DECADRON) injection 10 mg (has no administration in time range)    ED Course  I have reviewed the triage vital signs and the nursing notes.  Pertinent labs & imaging results that were available during  my care of the patient were reviewed by me and considered in my medical decision making (see chart for details).  Update: Patient remains hemodynamically unremarkable, aside from mild hypertension.  He notes that he has not been able to take his medication today.  He will be routed antihypertensives. He has not yet received bronchodilators, has no new oxygen requirement, however. I reviewed findings thus far, including 2 troponins, technically positive, but both decreased from prior hospitalization and in a decreasing values today.  BNP similarly improved from recent hospitalization as is his leukocytosis.  He is afebrile, awake, alert.  With abnormal initial x-ray, CT scan was performed, this consistent with bronchitis, no evidence for pneumonia, and patient's pulmonary perfusion scan from last week reviewed, negative for pulmonary embolism.  With no new oxygen requirement, no ongoing chest pain, reassuring labs as above, patient may be appropriate for discharge after provision of his medications as above, and ongoing bronchodilators, initiation of high-dose steroids on discharge.  3:27 PM Patient has decided to leave rather than stay for additional bronchodilator therapy. MDM Rules/Calculators/A&P MDM Number of Diagnoses or Management Options Atypical chest pain: new, needed workup SOB (shortness of breath): new, needed workup   Amount and/or Complexity of Data Reviewed Clinical lab tests: ordered and reviewed Tests in the radiology section of CPT: ordered and reviewed Tests in the medicine section of CPT: reviewed and ordered Decide to obtain previous medical records or to obtain history from someone other than the patient: yes Review and summarize past medical records: yes Independent visualization of images, tracings, or specimens: yes  Risk of Complications, Morbidity, and/or Mortality Presenting problems: high Diagnostic procedures: high Management options: high  Critical  Care Total  time providing critical care: < 30 minutes  Patient Progress Patient progress: stable   Final Clinical Impression(s) / ED Diagnoses Final diagnoses:  Atypical chest pain  SOB (shortness of breath)     Carmin Muskrat, MD 04/27/21 1528

## 2021-04-27 NOTE — ED Triage Notes (Signed)
Pt. Stated, Ive had chest pain with stomach pain for the last 3-4 days. Im feeling bad all over.

## 2021-04-27 NOTE — ED Notes (Signed)
Pt growing more and more upset. Pt ordered a food tray and informed it would take a bit of time to be delivered to room. Pt given snacks in meantime.

## 2021-04-27 NOTE — Discharge Instructions (Addendum)
   Although you have elected to leave prior to receiving additional therapy, do not hesitate to return here if you feel worse or any change in your condition.  Otherwise, please continue taking all medication as directed.  Please be sure to follow-up with your physician.

## 2021-04-27 NOTE — Telephone Encounter (Signed)
Heart Failure Nurse Navigator Progress Note  Attempted to call to remind patient of HV TOC appt today at 11AM. Unable to reach patient at phone numbers listed (trac phone given to patient via HF CSW as inpt--rings then states phone number is disconnected.) Other number does not ring.   UNABLE TO REACH PATIENT.   Pricilla Holm, MSN, RN Heart Failure Nurse Navigator [phone]

## 2021-04-27 NOTE — ED Provider Notes (Signed)
Emergency Medicine Provider Triage Evaluation Note  NAVEED HUMPHRES , a 60 y.o. male  was evaluated in triage.  Pt complains of chest pain since 3-4 days ago states it is "balled up" pressure in his chest. No cough, congestion fever or chills.  Also complains of some upper abd pain. No NVD.  Smokes tobacco   States uses cocaine and last used a week ago but states he "cooked" some cocaine 4-5 days ago.   Review of Systems  Positive: Abd pain, CP Negative: Fever   Physical Exam  There were no vitals taken for this visit. Gen:   Awake, no distress   Resp:  Normal effort  MSK:   Moves extremities without difficulty  Other:  Mild diffuse nonfocal abd TTP.   Medical Decision Making  Medically screening exam initiated at 10:24 AM.  Appropriate orders placed.  BRODIE CORRELL was informed that the remainder of the evaluation will be completed by another provider, this initial triage assessment does not replace that evaluation, and the importance of remaining in the ED until their evaluation is complete.  Labs and imaging ordered.    Tedd Sias, [place] 04/27/21 1030    Lucrezia Starch, MD 04/28/21 2238

## 2021-04-27 NOTE — ED Notes (Signed)
Pt threw ice outside of room after being upset.. Pt informed his actions arent not acceptable as his action would have hurt someone

## 2021-04-27 NOTE — ED Notes (Signed)
Pt agitated and walking around in room. This RN asked pt if I could do anything for him. Pt adamant about leaving. No IV noted on pt. Pt refused VS prior to leaving. Notified MD.

## 2021-04-28 ENCOUNTER — Other Ambulatory Visit: Admitting: *Deleted

## 2021-04-28 ENCOUNTER — Telehealth: Payer: Self-pay

## 2021-04-28 NOTE — Patient Outreach (Addendum)
Medicaid Managed Care   Nurse Care Manager Note  04/28/2021 Name:  Chris [name] MRN:  [id number] DOB:  06-Oct-1960  Chris [name] is an 60 y.o. year old male who is a primary patient of [name], MD.  The [facility] Managed Care Coordination team was consulted for assistance with:    CHF  Chris [name] was given information about Medicaid Managed Care Coordination team services today. Chris [name] Patient agreed to services and verbal consent obtained.  Engaged with patient by telephone for follow up visit in response to provider referral for case management and/or care coordination services.   Assessments/Interventions:  Review of past medical history, allergies, medications, health status, including review of consultants reports, laboratory and other test data, was performed as part of comprehensive evaluation and provision of chronic care management services.  SDOH (Social Determinants of Health) assessments and interventions performed: SDOH Interventions    Flowsheet Row Most Recent Value  SDOH Interventions   Social Connections Interventions Patient Refused       Care Plan  No Known Allergies  Medications Reviewed Today     Patient did not have medications with him, RNCM unable to review           Patient Active Problem List   Diagnosis Date Noted   CHF exacerbation ([place]) 04/14/2021   Chest pain 04/14/2021   Syncope 04/14/2021   Left-sided weakness 10/28/2020   COPD exacerbation (HCC)    Typical atrial flutter (HCC)    Acute respiratory failure with hypoxia ([place]) 07/05/2020   CHF (congestive heart failure) ([place]) 07/04/2020   Acute exacerbation of CHF (congestive heart failure) ([place]) 06/16/2020   COPD with acute exacerbation ([place]) 06/16/2020   Influenza vaccine refused 05/06/2020   Acute on chronic combined systolic (congestive) and diastolic (congestive) heart failure ([place]) 05/05/2020   Acute decompensated heart failure ([place]) 05/04/2020    Illiteracy 05/04/2020   Chronic respiratory failure with hypoxia, on home oxygen therapy ([place]) 12/28/2019   Type 2 diabetes mellitus with stage 3 chronic kidney disease ([place]) 12/25/2019   Acute and chronic respiratory failure (acute-on-chronic) ([place]) 12/25/2019   Acute on chronic combined systolic and diastolic CHF (congestive heart failure) ([place]) 10/26/2019   Elevated troponin I level 10/26/2019   Acute on chronic diastolic (congestive) heart failure ([place]) 10/26/2019   History of gout 02/01/2019   Seasonal allergic rhinitis due to pollen 02/01/2019   Tobacco dependence 11/30/2018   Microscopic hematuria 11/30/2018   Depression 11/30/2018   Difficulty controlling anger 11/30/2018   CAP (community acquired pneumonia) 08/11/2018   COPD (chronic obstructive pulmonary disease) ([place])    CKD (chronic kidney disease) stage 3, GFR 30-59 ml/min ([place]) 08/10/2018   Recurrent epistaxis 04/21/2018   Mixed hyperlipidemia 07/28/2017   Essential hypertension 95/02/3266   Chronic systolic heart failure ([place]) 10/25/2014   Cocaine abuse ([place]) 02/20/2013   Cannabis abuse 02/20/2013   Back pain, chronic 02/20/2013    Conditions to be addressed/monitored per PCP order:  CHF  Care Plan : Heart Failure (Adult)  Updates made by [name], RN since 04/28/2021 12:00 AM  Completed 04/28/2021   Problem: Symptom Exacerbation (Heart Failure) Resolved 04/28/2021  Note:   Resolving due to duplicate goal     Long-Range Goal: Symptom Exacerbation Prevented or Minimized Completed 04/28/2021  Start Date: 07/02/2020  Expected End Date: 03/19/2021  Recent Progress: On track  Priority: High  Note:   Current Barriers:  Chronic Disease Management, support and educational needs related to management of  multiple chronic diagnosis. Chris [name] is managing CHF, DMII, stage III CKD and chronic respiratory failure. He missed his last appointment with his PCP and cardiologist. He reports eating right, checking his  blood sugar daily and taking all of his medications. He recently moved and is living with his brother, desires his own place. Chris [name] had a recent visit to the ED for pain and weakness. He reports feeling better. He is unable to focus on his health due to his unhappiness with his current living situation. He is living with his brother, but he only wants a place of his own. Chris [name] reports now living on his own in his uncles house. He reports that the house needs work, but he will save the money and do the work. He reports feeling less stress now. Now he can focus on his health. He does need food benefits, states that he was denied due to "charges". He does not have transportation, but he is able to walk to a grocery store, Chris [name] and Chris [name]. Dr. Wynetta Emery ordered a new nebulizer and glucometer on 7/1 -Update- Chris [name] is aware of his upcoming appointments and has transportation. RNCM encouraged patient to go by local food pantries while out for these appointments.  Resolving due to duplicate goal  Nurse Case Manager Clinical Goal(s):  patient will work with Jack[facility]ing [facility] to address needs related to housing-Chris [name] reports no assistance available patient will meet with RN Care Manager to address needs related to managing CHF and DMII patient will demonstrate improved health management independence as evidenced bytaking prescribed medications, reporting any changes in symptoms to PCP such as swelling, changes in breathing, new cough Patient will work with MM pharmacist, Ovid Curd for medication management Patient will work with Jerene Pitch, LCSW for mental health-[facility] Patient will work with Ubal[employer] Glassing for housing insecurity-Patient currently has is own place Interventions:  Inter-disciplinary care team collaboration (see longitudinal plan of care) Evaluation of current treatment plan related to CHF and DMII and patient's adherence to plan as established by  provider. Advised patient to call and schedule an appointment with Cardiology-Dr. Clayborn Bigness [phone] verbally provided Advised patient to follow up with all scheduled appointments-PCP 02/19/21 and Heart Failure Clinic 02/25/21 Discussed going by food pantries from provided list while out for next appointments Provided number to [employer] [phone], per Jane's note, patient is to take nebulizer to be repaired or replaced Discussed plans with patient for ongoing care management follow up and provided patient with direct contact information for care management team Provided therapeutic listening Patient Goals/Self-Care Activities - call and make an appointment with Cardiology-Dr. Clayborn Bigness [phone] - continue to work on smoking cessation - eat more whole grains, fruits and vegetables, lean meats and healthy fats -Call to fill prescriptions one week before I run out of medication -Take all medications as prescribed - call office if I gain more than 2 pounds in one day or 5 pounds in one week - use salt in moderation - watch for swelling in feet, ankles and legs every day  - eat more whole grains, fruits and vegetables, lean meats and healthy fats - know when to call the doctor - track symptoms and what helps feel better or worse - dress right for the weather, hot or cold - eat a heart healthy diet, low fat, no salt - contact provider with any concerns or questions - attend all scheduled appointments: PCP 02/19/21 and Heart Failure Clinic 02/25/21 Follow Up Plan: Telephone follow up appointment with  Managed Medicaid care management team member scheduled for:03/19/21 @ 11:15am     Care Plan : RN Care Manager Plan of Care  Updates made by [name], RN since 04/28/2021 12:00 AM     Problem: Knowledge Deficits and Care Coordination Needs related to long term management of CHF   Priority: High     Long-Range Goal: Development of Plan of Care to address Care Coordination Needs and  Knowledge Deficits related to CHF   Start Date: 04/28/2021  Expected End Date: 07/27/2021  Priority: High  Note:   Current Barriers:  Knowledge Deficits related to plan of care for management of CHF  Care Coordination needs related to Transportation  Non-adherence to prescribed medication regimen Difficulty obtaining medications Mr. Meyers is having difficulty remembering to take his medications. He prefers compliance packaging. He was recently in the ED with chest pain and left AMA. He reports missing HF Clinic appointment due to issues with transportation. He has not seen cardiology since 2020.  RNCM Clinical Goal(s):  Patient will verbalize understanding of plan for management of CHF as evidenced by patient verbalization of plan take all medications exactly as prescribed and will call provider for medication related questions as evidenced by physician notes in EMR    attend all scheduled medical appointments: Dr. Wynetta Emery on 05/14/21 at 9:30am as evidenced by physician notes in EMR        demonstrate improved adherence to prescribed treatment plan for CHF as evidenced by attending PCP appointment and scheduling with Cardiology and HF Clinic work with pharmacist to address Medication procurement related to CHF as evidenced by review of EMR and patient or pharmacist report    through collaboration with RN Care manager, provider, and care team.   Interventions: Inter-disciplinary care team collaboration (see longitudinal plan of care) Evaluation of current treatment plan related to  self management and patient's adherence to plan as established by provider Collaborate with MM Pharmacist, for assistance with medication compliance Advised patient to take all medications to his appointment with Dr. Wynetta Emery Provided patient with information for medical transportation provided by [facility] [phone] Provided numbers for HF Clinic, [place], [place]- [phone] and Cardiology, Dr. Clayborn Bigness-  [phone]. Patient to call and schedule appointments   Heart Failure Interventions:  (Status: New goal.)  Long Term Goal  Basic overview and discussion of pathophysiology of Heart Failure reviewed Advised patient to weigh each morning after emptying bladder Discussed importance of daily weight and advised patient to weigh and record daily Discussed the importance of keeping all appointments with provider Assessed social determinant of health barriers  Patient Goals/Self-Care Activities: Patient will self administer medications as prescribed as evidenced by self report/primary caregiver report  Patient will attend all scheduled provider appointments as evidenced by clinician review of documented attendance to scheduled appointments and patient/caregiver report Patient will attend church or other social activities as evidenced by patient report Patient will continue to perform ADL's independently as evidenced by patient/caregiver report Patient will continue to perform IADL's independently as evidenced by patient/caregiver report Patient will call provider office for new concerns or questions as evidenced by review of documented incoming telephone call notes and patient report Patient will work with MM Pharmacist for medication management as evidenced by notes in EMR call office if I gain more than 2 pounds in one day or 5 pounds in one week use salt in moderation watch for swelling in feet, ankles and legs every day weigh myself daily know when to call the doctor  Follow Up:  Patient agrees to Care Plan and Follow-up.  Plan: The Managed Medicaid care management team will reach out to the patient again over the next 7 days.  Date/time of next scheduled RN care management/care coordination outreach:  05/04/21 @ 10:30am  [name] RN, BSN [place] RN Care Coordinator

## 2021-04-28 NOTE — Telephone Encounter (Signed)
Transition Care Management Unsuccessful Follow-up Telephone Call  Date of discharge and from where:  04/27/2021-[facility]   Attempts:  1st Attempt  Reason for unsuccessful TCM follow-up call:  Voice mail full

## 2021-04-28 NOTE — Patient Instructions (Signed)
Visit Information  Chris [name] was given information about Medicaid Managed Care team care coordination services as a part of their Healthy [facility] Medicaid benefit. Chris [name] verbally consented to engagement with the [facility] Managed Care team.   If you are experiencing a medical emergency, please call 911 or report to your local emergency department or urgent care.   If you have a non-emergency medical problem during routine business hours, please contact your provider's office and ask to speak with a nurse.   For questions related to your Healthy [facility] health plan, please call: [phone] or visit the homepage here: [email or web address]  If you would like to schedule transportation through your Healthy [facility] plan, please call the following number at least 2 days in advance of your appointment: [phone]  Call the [place] at [phone], at any time, 24 hours a day, 7 days a week. If you are in danger or need immediate medical attention call 911.  If you would like help to quit smoking, call 1-800-QUIT-NOW (501) 066-2142) OR Espaol: 1-855-Djelo-Ya (9-937-169-6789) o para ms informacin haga clic aqu or Text READY to 200-400 to register via text  Chris [name] - following are the goals we discussed in your visit today:   Goals Addressed             This Visit's Progress    COMPLETED: Track and Manage Fluids and Swelling-Heart Failure       Resolving due to duplicate goal  Timeframe:  Long-Range Goal Priority:  High Start Date: 07/02/20                         Expected End Date: 03/19/21      Follow up 03/19/21              - eat a heart healthy diet, low fat, no salt -Call to fill prescriptions one week before I run out of medication -Take all medications as prescribed - call office if I gain more than 2 pounds in one day or 5 pounds in one week - use salt in moderation - watch for swelling in  feet, ankles and legs every day    Why is this important?   It is important to check your weight daily and watch how much salt and liquids you have.  It will help you to manage your heart failure.         COMPLETED: Track and Manage Symptoms-Heart Failure       Resolving due to duplicate goal  Timeframe:  Long-Range Goal Priority:  Medium Start Date:  07/02/20                           Expected End Date: 03/19/21                     Follow up 03/19/21    - call and make an appointment with Cardiology-Dr. Clayborn Bigness [phone] - continue to work on smoking cessation - eat more whole grains, fruits and vegetables, lean meats and healthy fats - know when to call the doctor - track symptoms and what helps feel better or worse - dress right for the weather, hot or cold  - contact provider with any concerns or questions - attend all scheduled appointments: PCP 02/19/21 and Heart Failure Clinic 02/25/21   Why is this important?   You will be able to handle your symptoms better  if you keep track of them.  Making some simple changes to your lifestyle will help.  Eating healthy is one thing you can do to take good care of yourself.            Please see education materials related to CHF provided as print materials.   The patient verbalized understanding of instructions provided today and agreed to receive a mailed copy of patient instruction and/or educational materials.  Telephone follow up appointment with Managed Medicaid care management team member scheduled for:05/04/21 @ 10:30am  [name] RN, BSN [place] RN Care Coordinator   Following is a copy of your plan of care:  Care Plan : Heart Failure (Adult)  Updates made by [name], RN since 04/28/2021 12:00 AM  Completed 04/28/2021   Problem: Symptom Exacerbation (Heart Failure) Resolved 04/28/2021  Note:   Resolving due to duplicate goal     Long-Range Goal: Symptom  Exacerbation Prevented or Minimized Completed 04/28/2021  Start Date: 07/02/2020  Expected End Date: 03/19/2021  Recent Progress: On track  Priority: High  Note:   Current Barriers:  Chronic Disease Management, support and educational needs related to management of multiple chronic diagnosis. Chris [name] is managing CHF, DMII, stage III CKD and chronic respiratory failure. He missed his last appointment with his PCP and cardiologist. He reports eating right, checking his blood sugar daily and taking all of his medications. He recently moved and is living with his brother, desires his own place. Chris [name] had a recent visit to the ED for pain and weakness. He reports feeling better. He is unable to focus on his health due to his unhappiness with his current living situation. He is living with his brother, but he only wants a place of his own. Chris [name] reports now living on his own in his uncles house. He reports that the house needs work, but he will save the money and do the work. He reports feeling less stress now. Now he can focus on his health. He does need food benefits, states that he was denied due to "charges". He does not have transportation, but he is able to walk to a grocery store, [employer] and [employer]. Dr. Wynetta Emery ordered a new nebulizer and glucometer on 7/1 -Update- Chris [name] is aware of his upcoming appointments and has transportation. RNCM encouraged patient to go by local food pantries while out for these appointments.  Resolving due to duplicate goal  Nurse Case Manager Clinical Goal(s):  patient will work with Jackelyn Poling [facility] to address needs related to housing-Chris [name] reports no assistance available patient will meet with RN Care Manager to address needs related to managing CHF and DMII patient will demonstrate improved health management independence as evidenced bytaking prescribed medications, reporting any changes in symptoms to PCP  such as swelling, changes in breathing, new cough Patient will work with MM pharmacist, Ovid Curd for medication management Patient will work with Jerene Pitch, LCSW for mental health-[facility] Patient will work with [employer] Glassing for housing insecurity-Patient currently has is own place Interventions:  Inter-disciplinary care team collaboration (see longitudinal plan of care) Evaluation of current treatment plan related to CHF and DMII and patient's adherence to plan as established by provider. Advised patient to call and schedule an appointment with Cardiology-Dr. Clayborn Bigness [phone] verbally provided Advised patient to follow up with all scheduled appointments-PCP 02/19/21 and Heart Failure Clinic 02/25/21 Discussed going by food pantries from provided list while out for next appointments  Provided number to [employer] [phone], per Jane's note, patient is to take nebulizer to be repaired or replaced Discussed plans with patient for ongoing care management follow up and provided patient with direct contact information for care management team Provided therapeutic listening Patient Goals/Self-Care Activities - call and make an appointment with Cardiology-Dr. Clayborn Bigness [phone] - continue to work on smoking cessation - eat more whole grains, fruits and vegetables, lean meats and healthy fats -Call to fill prescriptions one week before I run out of medication -Take all medications as prescribed - call office if I gain more than 2 pounds in one day or 5 pounds in one week - use salt in moderation - watch for swelling in feet, ankles and legs every day  - eat more whole grains, fruits and vegetables, lean meats and healthy fats - know when to call the doctor - track symptoms and what helps feel better or worse - dress right for the weather, hot or cold - eat a heart healthy diet, low fat, no salt - contact provider with any concerns or questions - attend all scheduled appointments: PCP 02/19/21 and Heart  Failure Clinic 02/25/21 Follow Up Plan: Telephone follow up appointment with Managed Medicaid care management team member scheduled for:03/19/21 @ 11:15am     Care Plan : [place] of Care  Updates made by [name], RN since 04/28/2021 12:00 AM     Problem: Knowledge Deficits and Care Coordination Needs related to long term management of CHF   Priority: High     Long-Range Goal: Development of Plan of Care to address Care Coordination Needs and Knowledge Deficits related to CHF   Start Date: 04/28/2021  Expected End Date: 07/27/2021  Priority: High  Note:   Current Barriers:  Knowledge Deficits related to plan of care for management of CHF  Care Coordination needs related to Transportation  Non-adherence to prescribed medication regimen Difficulty obtaining medications Chris [name] is having difficulty remembering to take his medications. He prefers compliance packaging. He was recently in the ED with chest pain and left AMA. He reports missing HF Clinic appointment due to issues with transportation. He has not seen cardiology since 2020.  RNCM Clinical Goal(s):  Patient will verbalize understanding of plan for management of CHF as evidenced by patient verbalization of plan take all medications exactly as prescribed and will call provider for medication related questions as evidenced by physician notes in EMR    attend all scheduled medical appointments: Dr. Wynetta Emery on 05/14/21 at 9:30am as evidenced by physician notes in EMR        demonstrate improved adherence to prescribed treatment plan for CHF as evidenced by attending PCP appointment and scheduling with Cardiology and HF Clinic work with pharmacist to address Medication procurement related to CHF as evidenced by review of EMR and patient or pharmacist report    through collaboration with RN Care manager, provider, and care team.   Interventions: Inter-disciplinary care team collaboration (see longitudinal plan of  care) Evaluation of current treatment plan related to  self management and patient's adherence to plan as established by provider Collaborate with MM Pharmacist, for assistance with medication compliance Advised patient to take all medications to his appointment with Dr. Wynetta Emery Provided patient with information for medical transportation provided by [facility] [phone] Provided numbers for HF Clinic, [place]- [phone] and Cardiology, Dr. Clayborn Bigness- [phone]. Patient to call and schedule appointments   Heart Failure Interventions:  (Status: New goal.)  Long Term  Goal  Basic overview and discussion of pathophysiology of Heart Failure reviewed Advised patient to weigh each morning after emptying bladder Discussed importance of daily weight and advised patient to weigh and record daily Discussed the importance of keeping all appointments with provider Assessed social determinant of health barriers  Patient Goals/Self-Care Activities: Patient will self administer medications as prescribed as evidenced by self report/primary caregiver report  Patient will attend all scheduled provider appointments as evidenced by clinician review of documented attendance to scheduled appointments and patient/caregiver report Patient will attend church or other social activities as evidenced by patient report Patient will continue to perform ADL's independently as evidenced by patient/caregiver report Patient will continue to perform IADL's independently as evidenced by patient/caregiver report Patient will call provider office for new concerns or questions as evidenced by review of documented incoming telephone call notes and patient report Patient will work with MM Pharmacist for medication management as evidenced by notes in EMR call office if I gain more than 2 pounds in one day or 5 pounds in one week use salt in moderation watch for swelling in feet, ankles and legs every day weigh  myself daily know when to call the doctor

## 2021-04-29 NOTE — Telephone Encounter (Signed)
Transition Care Management Follow-up Telephone Call Date of discharge and from where: 04/27/2021 from [facility] How have you been since you were released from the hospital? Pt state he is feeling a lot better and did not have any questions or concerns at this time.  Any questions or concerns? No  Items Reviewed: Did the pt receive and understand the discharge instructions provided? Yes  Medications obtained and verified? Yes  Other? No  Any new allergies since your discharge? No  Dietary orders reviewed? No Do you have support at home? Yes   Functional Questionnaire: (I = Independent and D = Dependent) ADLs: I  Bathing/Dressing- I  Meal Prep- I  Eating- I  Maintaining continence- I  Transferring/Ambulation- I  Managing Meds- I  Follow up appointments reviewed:  PCP Hospital f/u appt confirmed? Yes  Scheduled to see Karle Plumber, MD on 05/14/2021 @ 09:30am. [facility] f/u appt confirmed? No   Are transportation arrangements needed? No  If their condition worsens, is the pt aware to call PCP or go to the Emergency Dept.? Yes Was the patient provided with contact information for the PCP's office or ED? Yes Was to pt encouraged to call back with questions or concerns? Yes

## 2021-05-04 ENCOUNTER — Other Ambulatory Visit: Payer: Self-pay | Admitting: *Deleted

## 2021-05-04 ENCOUNTER — Other Ambulatory Visit: Payer: Self-pay

## 2021-05-04 NOTE — Patient Instructions (Signed)
Visit Information  [name]  - as a part of your Medicaid benefit, you are eligible for care management and care coordination services at no cost or copay. I was unable to reach you by phone today but would be happy to help you with your health related needs. Please feel free to call me @ [phone].   A member of the Managed Medicaid care management team will reach out to you again over the next 14 days.   [name] RN, BSN   [employer] manager

## 2021-05-04 NOTE — Patient Outreach (Addendum)
Care Coordination  05/04/2021  Chris [name] 1961/01/20 [id number]  Successful outreach with Chris [name] this morning. Chris [name] has not had a chance to call and schedule appointments with Cardiology or [facility], states that he has not had time and has not been around a phone. Chris [name] requesting a return call in about an hour. He is busy "doing something" right now. RNCM will attempt to reach patient later today.   RNCM attempted to reach patient as he requested. No answer. RNCM unable to leave a voicemail. RNCM will attempt to reach patient again over the next 14 days.  [name] RN, BSN [place]  [employer] manager

## 2021-05-13 ENCOUNTER — Ambulatory Visit (HOSPITAL_COMMUNITY): Payer: Medicaid Other | Admitting: Licensed Clinical Social Worker

## 2021-05-13 ENCOUNTER — Other Ambulatory Visit: Payer: Self-pay

## 2021-05-13 ENCOUNTER — Encounter (HOSPITAL_COMMUNITY): Payer: Self-pay

## 2021-05-13 ENCOUNTER — Telehealth (HOSPITAL_COMMUNITY): Payer: Self-pay | Admitting: Licensed Clinical Social Worker

## 2021-05-13 NOTE — Telephone Encounter (Signed)
LCSW attempted to complete comprehensive clinical assessment with patient.  LCSW asked patient "what brings you in here today".  Patient stated that he was sent in by his primary care physician LCSW inquired about what he felt he was here for patient stated "I do not know".  LCSW noted that referral was sent for anxiety and depression and patient stated "I do not have anything like that I just get angry sometimes".  LCSW asked patient if he wanted to complete a comprehensive clinical assessment to work on his anger patient stated "nah I really just keep to myself".  LCSW did explain the right to self-determination and that comprehensive clinical assessment did not have to be completed if he did not want to be completed however it was an option to be completed today.  Patient stated that he wanted to talk to his primary care physician about what the referral was sent over for and talk about it more in detail.  Pt explained by LCSW that rescheduling an appointment might take 4 to 8 weeks.  Patient stated that he understood and wanted to follow-up with his primary care physician tomorrow.

## 2021-05-13 NOTE — Progress Notes (Deleted)
LCSW attempted to complete comprehensive clinical assessment with patient.  LCSW asked patient "what brings you in here today".  Patient stated that he was sent in by his primary care physician LCSW inquired about what he felt he was here for patient stated "I do not know".  LCSW noted that referral was sent for anxiety and depression and patient stated "I do not have anything like that I just get angry sometimes".  LCSW asked patient if he wanted to complete a comprehensive clinical assessment to work on his anger patient stated "nah I really just keep to myself".  LCSW did explain the right to self-determination and that comprehensive clinical assessment did not have to be completed if he did not want to be completed however it was an option to be completed today.  Patient stated that he wanted to talk to his primary care physician about what the referral was sent over for and talk about it more in detail.  Pt explained by LCSW that rescheduling an appointment might take 4 to 8 weeks.  Patient stated that he understood and wanted to follow-up with his primary care physician tomorrow.

## 2021-05-14 ENCOUNTER — Ambulatory Visit: Payer: Medicaid Other | Attending: Internal Medicine | Admitting: Internal Medicine

## 2021-05-14 ENCOUNTER — Other Ambulatory Visit: Payer: Self-pay

## 2021-05-14 ENCOUNTER — Ambulatory Visit: Payer: Self-pay

## 2021-05-14 VITALS — BP 117/84 | HR 73 | Ht 72.0 in | Wt 279.0 lb

## 2021-05-14 DIAGNOSIS — N1831 Chronic kidney disease, stage 3a: Secondary | ICD-10-CM

## 2021-05-14 DIAGNOSIS — Z09 Encounter for follow-up examination after completed treatment for conditions other than malignant neoplasm: Secondary | ICD-10-CM | POA: Diagnosis not present

## 2021-05-14 DIAGNOSIS — F1910 Other psychoactive substance abuse, uncomplicated: Secondary | ICD-10-CM

## 2021-05-14 DIAGNOSIS — J440 Chronic obstructive pulmonary disease with acute lower respiratory infection: Secondary | ICD-10-CM | POA: Diagnosis not present

## 2021-05-14 DIAGNOSIS — E669 Obesity, unspecified: Secondary | ICD-10-CM | POA: Diagnosis not present

## 2021-05-14 DIAGNOSIS — I5042 Chronic combined systolic (congestive) and diastolic (congestive) heart failure: Secondary | ICD-10-CM

## 2021-05-14 DIAGNOSIS — N2889 Other specified disorders of kidney and ureter: Secondary | ICD-10-CM | POA: Insufficient documentation

## 2021-05-14 DIAGNOSIS — E1169 Type 2 diabetes mellitus with other specified complication: Secondary | ICD-10-CM | POA: Diagnosis not present

## 2021-05-14 DIAGNOSIS — Z1211 Encounter for screening for malignant neoplasm of colon: Secondary | ICD-10-CM

## 2021-05-14 DIAGNOSIS — I480 Paroxysmal atrial fibrillation: Secondary | ICD-10-CM

## 2021-05-14 DIAGNOSIS — L02415 Cutaneous abscess of right lower limb: Secondary | ICD-10-CM

## 2021-05-14 DIAGNOSIS — Z2821 Immunization not carried out because of patient refusal: Secondary | ICD-10-CM

## 2021-05-14 DIAGNOSIS — J209 Acute bronchitis, unspecified: Secondary | ICD-10-CM

## 2021-05-14 MED ORDER — SULFAMETHOXAZOLE-TRIMETHOPRIM 800-160 MG PO TABS
1.0000 | ORAL_TABLET | Freq: Two times a day (BID) | ORAL | 0 refills | Status: DC
Start: 2021-05-14 — End: 2021-08-20
  Filled 2021-05-14: qty 20, 10d supply, fill #0
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
## 2021-05-15 ENCOUNTER — Other Ambulatory Visit: Payer: Self-pay | Admitting: Internal Medicine

## 2021-05-15 ENCOUNTER — Other Ambulatory Visit: Payer: Self-pay

## 2021-05-15 DIAGNOSIS — J439 Emphysema, unspecified: Secondary | ICD-10-CM

## 2021-05-15 DIAGNOSIS — M109 Gout, unspecified: Secondary | ICD-10-CM

## 2021-05-15 LAB — MICROALBUMIN / CREATININE URINE RATIO
Creatinine, Urine: 37.5 mg/dL
Microalb/Creat Ratio: 149 mg/g creat — ABNORMAL HIGH (ref 0–29)
Microalbumin, Urine: 56 ug/mL

## 2021-05-15 MED ORDER — COLCHICINE 0.6 MG PO TABS
ORAL_TABLET | ORAL | 1 refills | Status: DC
  Filled 2021-05-15: qty 30, 10d supply, fill #0
  Filled 2021-05-22 – 2021-07-08 (×2): qty 30, 15d supply, fill #0

## 2021-05-15 MED ORDER — MONTELUKAST SODIUM 10 MG PO TABS
ORAL_TABLET | Freq: Every day | ORAL | 2 refills | Status: DC
  Filled 2021-05-15 – 2021-07-08 (×3): qty 30, 30d supply, fill #0
  Filled 2021-10-09: qty 30, 30d supply, fill #1

## 2021-05-16 NOTE — Progress Notes (Signed)
Let patient know that the amount of protein in his urine has decreased significantly compared to 1 year ago.  This is good.  Continue taking his medications as prescribed.

## 2021-05-18 ENCOUNTER — Telehealth: Payer: Self-pay

## 2021-05-18 ENCOUNTER — Other Ambulatory Visit: Payer: Self-pay

## 2021-05-18 ENCOUNTER — Other Ambulatory Visit: Payer: Self-pay | Admitting: *Deleted

## 2021-05-18 DIAGNOSIS — Z599 Problem related to housing and economic circumstances, unspecified: Secondary | ICD-10-CM

## 2021-05-18 NOTE — Patient Outreach (Signed)
Medicaid Managed Care   Nurse Care Manager Note  05/18/2021 Name:  Chris [name] MRN:  [id number] DOB:  1960-08-14  Chris [name] is an 60 y.o. year old male who is a primary patient of Chris Pier, MD.  The [facility] Managed Care Coordination team was consulted for assistance with:    CHF  Chris [name] was given information about Medicaid Managed Care Coordination team services today. Chris [name] Patient agreed to services and verbal consent obtained.  Engaged with patient by telephone for follow up visit in response to provider referral for case management and/or care coordination services.   Assessments/Interventions:  Review of past medical history, allergies, medications, health status, including review of consultants reports, laboratory and other test data, was performed as part of comprehensive evaluation and provision of chronic care management services.  SDOH (Social Determinants of Health) assessments and interventions performed: SDOH Interventions    Flowsheet Row Most Recent Value  SDOH Interventions   Transportation Interventions Other (Comment)  [Patient reports using medical transportation provided by [employer].]       Care Plan  No Known Allergies  Medications Reviewed Today     Reviewed by [name], RN (Registered Nurse) on 05/18/21 at 1114  Med List Status: <None>   Medication Order Taking? Sig Documenting Provider Last Dose Status Informant  Accu-Chek Softclix Lancets lancets [id number] No Use as instructed Chris Pier, MD Taking Active Self  albuterol (PROVENTIL) (2.5 MG/3ML) 0.083% nebulizer solution [id number] No TAKE 3 MLS BY NEBULIZATION EVERY 6 (SIX) HOURS AS NEEDED FOR SHORTNESS OF BREATH. Chris Pier, MD Not Taking Active Self           Med Note [name] Apr 27, 2021  1:29 PM)    albuterol (VENTOLIN HFA) 108 [phone] Base) MCG/ACT inhaler [id number] No INHALE 2 PUFFS INTO THE LUNGS EVERY 6 (SIX) HOURS AS NEEDED  FOR WHEEZING OR SHORTNESS OF BREATH. Chris Pier, MD Taking Active Self           Med Note [name] Apr 27, 2021  1:29 PM)    allopurinol (ZYLOPRIM) 100 MG tablet [id number] No TAKE 2 TABLETS (200 MG TOTAL) BY MOUTH DAILY. Chris Pier, MD Taking Active Self           Med Note [name] Apr 27, 2021  1:29 PM)    apixaban (ELIQUIS) 5 MG TABS tablet [id number] No TAKE 1 TABLET (5 MG TOTAL) BY MOUTH 2 (TWO) TIMES DAILY. Chris Pier, MD Taking Active Self           Med Note [name] Apr 27, 2021  1:26 PM)    aspirin 81 MG EC tablet [id number] No Take 1 tablet (81 mg total) by mouth daily. Chris Pier, MD Taking Active Self           Med Note [name] Apr 27, 2021  1:27 PM)    atorvastatin (LIPITOR) 40 MG tablet [id number] No Take 1 tablet (40 mg total) by mouth daily. Chris Pier, MD Taking Active Self           Med Note [name] Apr 27, 2021  1:29 PM)    Blood Glucose Monitoring Suppl (ACCU-CHEK GUIDE) w/Device [name] [id number] No Use as directed Chris Pier, MD Taking Active Self  budesonide-formoterol [facility]) 160-4.5 MCG/ACT  inhaler [id number] No Inhale 2 puffs into the lungs 2 (two) times daily. Chris Pier, MD Taking Active Self           Med Note [name] Apr 27, 2021  1:29 PM)    carvedilol (COREG) 6.25 MG tablet [id number] No Take 1 tablet (6.25 mg total) by mouth 2 (two) times daily. Alisa Graff, FNP Taking Active Self           Med Note [name] Apr 27, 2021  1:29 PM)    colchicine 0.6 MG tablet [id number]  Take 2 tabs (1.2 mg) at the onset of a gout flare, may repeat 1 tab (0.6 mg) after 2 hours if symptoms persist. Chris Pier, MD  Active   dapagliflozin propanediol (FARXIGA) 10 MG TABS tablet [id number] No Take 1 tablet (10 mg total) by mouth daily.  Patient taking differently: Take 10 mg by mouth in the morning and at bedtime.    Alisa Graff, FNP Taking Active Self           Med Note [name] Apr 27, 2021  1:29 PM)    furosemide (LASIX) 40 MG tablet [id number] No Take 1 tablet (40 mg total) by mouth daily. Alisa Graff, FNP Taking Active Self           Med Note [name] Apr 27, 2021  1:29 PM)    glipiZIDE (GLUCOTROL) 5 MG tablet [id number] No Take 0.5 tablets (2.5 mg total) by mouth 2 (two) times daily before a meal. Chris Pier, MD Taking Active Self           Med Note [name] Apr 27, 2021  1:29 PM)    glucose blood (ACCU-CHEK GUIDE) test strip [id number] No Use as directed to check blood sugar 1-2 times a day Chris Pier, MD Taking Active Self  isosorbide-hydrALAZINE (BIDIL) 20-37.5 MG tablet [id number] No Take 1 tablet by mouth 3 (three) times daily. [provider] Taking Active Self  Discontinued 07/11/20 [phone] (Discontinued by provider)   montelukast (SINGULAIR) 10 MG tablet [id number]  TAKE 1 TABLET (10 MG TOTAL) BY MOUTH AT BEDTIME. Chris Pier, MD  Active   sulfamethoxazole-trimethoprim (BACTRIM DS) 800-160 MG tablet [id number]  Take 1 tablet by mouth 2 (two) times daily. Chris Pier, MD  Active             Patient Active Problem List   Diagnosis Date Noted   Right kidney mass 05/14/2021   CHF exacerbation ([place]) 04/14/2021   Chest pain 04/14/2021   Syncope 04/14/2021   Left-sided weakness 10/28/2020   COPD exacerbation (HCC)    Typical atrial flutter (HCC)    Acute respiratory failure with hypoxia ([place]) 07/05/2020   CHF (congestive heart failure) ([place]) 07/04/2020   Acute exacerbation of CHF (congestive heart failure) ([place]) 06/16/2020   COPD with acute exacerbation ([place]) 06/16/2020   Influenza vaccine refused 05/06/2020   Acute on chronic combined systolic (congestive) and diastolic (congestive) heart failure ([place]) 05/05/2020   Acute decompensated heart failure ([place]) 05/04/2020   Illiteracy 05/04/2020   Chronic  respiratory failure with hypoxia, on home oxygen therapy ([place]) 12/28/2019   Type 2 diabetes mellitus with stage 3 chronic kidney disease ([place]) 12/25/2019   Acute and chronic respiratory failure (acute-on-chronic) ([place]) 12/25/2019   Acute on chronic combined systolic and diastolic CHF (congestive heart failure) (  [place]) 10/26/2019   Elevated troponin I level 10/26/2019   Acute on chronic diastolic (congestive) heart failure ([place]) 10/26/2019   History of gout 02/01/2019   Seasonal allergic rhinitis due to pollen 02/01/2019   Tobacco dependence 11/30/2018   Microscopic hematuria 11/30/2018   Depression 11/30/2018   Difficulty controlling anger 11/30/2018   CAP (community acquired pneumonia) 08/11/2018   COPD (chronic obstructive pulmonary disease) ([place])    CKD (chronic kidney disease) stage 3, GFR 30-59 ml/min ([place]) 08/10/2018   Recurrent epistaxis 04/21/2018   Mixed hyperlipidemia 07/28/2017   Essential hypertension 24/23/5361   Chronic systolic heart failure ([place]) 10/25/2014   Cocaine abuse ([place]) 02/20/2013   Cannabis abuse 02/20/2013   Back pain, chronic 02/20/2013    Conditions to be addressed/monitored per PCP order:  CHF  Care Plan : RN Care Manager Plan of Care  Updates made by [name], RN since 05/18/2021 12:00 AM     Problem: Knowledge Deficits and Care Coordination Needs related to long term management of CHF   Priority: High     Long-Range Goal: Development of Plan of Care to address Care Coordination Needs and Knowledge Deficits related to CHF   Start Date: 04/28/2021  Expected End Date: 07/27/2021  Priority: High  Note:   Current Barriers:  Knowledge Deficits related to plan of care for management of CHF  Care Coordination needs related to Transportation  Non-adherence to prescribed medication regimen Difficulty obtaining medications Mr. Schmelzle attended appointment with PCP on 12/1. Reports that with provided medical transportation from [facility], he will  never miss an appointment again. He was in a hurry today, his ride was waiting on him and we were unable to review all medications. RNCM reminded him to pick up his refills, medical transportation will take him to the pharmacy. He is requesting assistance with plumbing issues today.  RNCM Clinical Goal(s):  Patient will verbalize understanding of plan for management of CHF as evidenced by patient verbalization of plan take all medications exactly as prescribed and will call provider for medication related questions as evidenced by physician notes in EMR    attend all scheduled medical appointments: follow up on referrals placed by PCP as evidenced by physician notes in EMR        demonstrate improved adherence to prescribed treatment plan for CHF as evidenced by attending PCP appointment and scheduling with Cardiology and HF Clinic work with pharmacist to address Medication procurement related to CHF as evidenced by review of EMR and patient or pharmacist report    through collaboration with RN Care manager, provider, and care team.   Interventions: Inter-disciplinary care team collaboration (see longitudinal plan of care) Evaluation of current treatment plan related to  self management and patient's adherence to plan as established by provider Advised patient to pick up medications and take as directed Encouraged patient to schedule appointments for all referrals placed by PCP, explained the importance of answering the phone   Heart Failure Interventions:  (Status: Goal on Track (progressing): YES.)  Long Term Goal  Basic overview and discussion of pathophysiology of Heart Failure reviewed Provided education on low sodium diet Discussed the importance of keeping all appointments with provider Referral made to community resources care guide team for assistance with plumbing Assessed social determinant of health barriers Discussed the importance of following up with Cardiology  referral  Patient Goals/Self-Care Activities: Patient will self administer medications as prescribed as evidenced by self report/primary caregiver report  Patient will attend all scheduled provider  appointments as evidenced by clinician review of documented attendance to scheduled appointments and patient/caregiver report Patient will attend church or other social activities as evidenced by patient report Patient will continue to perform ADL's independently as evidenced by patient/caregiver report Patient will continue to perform IADL's independently as evidenced by patient/caregiver report Patient will call provider office for new concerns or questions as evidenced by review of documented incoming telephone call notes and patient report Patient will work with MM Pharmacist for medication management as evidenced by notes in EMR call office if I gain more than 2 pounds in one day or 5 pounds in one week use salt in moderation watch for swelling in feet, ankles and legs every day weigh myself daily know when to call the doctor        Follow Up:  Patient agrees to Care Plan and Follow-up.  Plan: The Managed Medicaid care management team will reach out to the patient again over the next 30 days.  Date/time of next scheduled RN care management/care coordination outreach:  06/18/2021 @ 11:15am  [name] RN, BSN   [employer] manager

## 2021-05-18 NOTE — Patient Instructions (Signed)
Visit Information  Mr. Budlong was given information about Medicaid Managed Care team care coordination services as a part of their Healthy [facility] Medicaid benefit. EMMETTE KATT verbally consented to engagement with the [facility] Managed Care team.   If you are experiencing a medical emergency, please call 911 or report to your local emergency department or urgent care.   If you have a non-emergency medical problem during routine business hours, please contact your provider's office and ask to speak with a nurse.   For questions related to your Healthy [facility] [employer] [facility] health plan, please call: [phone] or visit the homepage here: [email or web address]  If you would like to schedule transportation through your Healthy [facility] plan, please call the following number at least 2 days in advance of your appointment: [phone]  Call the [place] at [phone], at any time, 24 hours a day, 7 days a week. If you are in danger or need immediate medical attention call 911.  If you would like help to quit smoking, call 1-800-QUIT-NOW 930-181-9135) OR Espaol: 1-855-Djelo-Ya (7-341-937-9024) o para ms informacin haga clic aqu or Text READY to 200-400 to register via text     Please see education materials related to CHF provided as print materials.   The patient verbalized understanding of instructions provided today and agreed to receive a mailed copy of patient instruction and/or educational materials.  Telephone follow up appointment with Managed Medicaid care management team member scheduled for:06/18/2021 @ 11:15am  [name] RN, BSN [place] RN Care Coordinator   Following is a copy of your plan of care:  Care Plan : RN Care Manager Plan of Care  Updates made by [name], RN since 05/18/2021 12:00 AM     Problem: Knowledge Deficits and Care Coordination Needs related to long  term management of CHF   Priority: High     Long-Range Goal: Development of Plan of Care to address Care Coordination Needs and Knowledge Deficits related to CHF   Start Date: 04/28/2021  Expected End Date: 07/27/2021  Priority: High  Note:   Current Barriers:  Knowledge Deficits related to plan of care for management of CHF  Care Coordination needs related to Transportation  Non-adherence to prescribed medication regimen Difficulty obtaining medications Mr. Calender attended appointment with PCP on 12/1. Reports that with provided medical transportation from [facility], he will never miss an appointment again. He was in a hurry today, his ride was waiting on him and we were unable to review all medications. RNCM reminded him to pick up his refills, medical transportation will take him to the pharmacy. He is requesting assistance with plumbing issues today.  RNCM Clinical Goal(s):  Patient will verbalize understanding of plan for management of CHF as evidenced by patient verbalization of plan take all medications exactly as prescribed and will call provider for medication related questions as evidenced by physician notes in EMR    attend all scheduled medical appointments: follow up on referrals placed by PCP as evidenced by physician notes in EMR        demonstrate improved adherence to prescribed treatment plan for CHF as evidenced by attending PCP appointment and scheduling with Cardiology and HF Clinic work with pharmacist to address Medication procurement related to CHF as evidenced by review of EMR and patient or pharmacist report    through collaboration with RN Care manager, provider, and care team.   Interventions: Inter-disciplinary care team collaboration (see longitudinal plan of care)  Evaluation of current treatment plan related to  self management and patient's adherence to plan as established by provider Advised patient to pick up medications and take as directed Encouraged  patient to schedule appointments for all referrals placed by PCP, explained the importance of answering the phone   Heart Failure Interventions:  (Status: Goal on Track (progressing): YES.)  Long Term Goal  Basic overview and discussion of pathophysiology of Heart Failure reviewed Provided education on low sodium diet Discussed the importance of keeping all appointments with provider Referral made to community resources care guide team for assistance with plumbing Assessed social determinant of health barriers Discussed the importance of following up with Cardiology referral  Patient Goals/Self-Care Activities: Patient will self administer medications as prescribed as evidenced by self report/primary caregiver report  Patient will attend all scheduled provider appointments as evidenced by clinician review of documented attendance to scheduled appointments and patient/caregiver report Patient will attend church or other social activities as evidenced by patient report Patient will continue to perform ADL's independently as evidenced by patient/caregiver report Patient will continue to perform IADL's independently as evidenced by patient/caregiver report Patient will call provider office for new concerns or questions as evidenced by review of documented incoming telephone call notes and patient report Patient will work with MM Pharmacist for medication management as evidenced by notes in EMR call office if I gain more than 2 pounds in one day or 5 pounds in one week use salt in moderation watch for swelling in feet, ankles and legs every day weigh myself daily know when to call the doctor

## 2021-05-18 NOTE — Telephone Encounter (Signed)
Contacted pt to go over lab results pt is aware and doesn't have any questions or concerns 

## 2021-05-21 ENCOUNTER — Other Ambulatory Visit: Payer: Self-pay

## 2021-05-21 ENCOUNTER — Other Ambulatory Visit: Payer: Self-pay | Admitting: Pharmacist

## 2021-05-21 MED ORDER — ALBUTEROL SULFATE HFA 108 (90 BASE) MCG/ACT IN AERS
2.0000 | INHALATION_SPRAY | Freq: Four times a day (QID) | RESPIRATORY_TRACT | 12 refills | Status: DC | PRN
  Filled 2021-05-21 – 2021-06-19 (×2): qty 18, 25d supply, fill #0
  Filled 2021-07-09: qty 18, 25d supply, fill #1
  Filled 2021-07-13: qty 18, 25d supply, fill #2
  Filled 2021-08-13: qty 18, 25d supply, fill #0
  Filled 2021-10-09: qty 18, 25d supply, fill #1
  Filled 2021-10-15: qty 18, 25d supply, fill #2
  Filled 2022-02-03: qty 18, 25d supply, fill #3
  Filled 2022-02-12 – 2022-04-08 (×2): qty 18, 25d supply, fill #4

## 2021-05-22 ENCOUNTER — Other Ambulatory Visit: Payer: Self-pay

## 2021-06-09 ENCOUNTER — Ambulatory Visit: Payer: Self-pay

## 2021-06-09 ENCOUNTER — Telehealth: Payer: Self-pay | Admitting: Pharmacist

## 2021-06-09 NOTE — Patient Outreach (Signed)
06/09/2021 Name: Chris [name] MRN: [id number] DOB: [birth year]  Referred by: [name], MD Reason for referral : No chief complaint on file.   An unsuccessful telephone outreach was attempted today. The patient was referred to the case management team for assistance with care management and care coordination.    Follow Up Plan: The Managed Medicaid care management team will reach out to the patient again over the next 14 days. Voicemail box was full.  Hughes Better PharmD, CPP High Risk Managed Medicaid [place] [phone]

## 2021-06-10 ENCOUNTER — Telehealth: Payer: Self-pay

## 2021-06-10 NOTE — Telephone Encounter (Signed)
Telephone encounter was:  Unsuccessful.  06/10/2021 Name: RAEF MERKEL MRN: [id number] DOB: 06-Aug-1960  Unsuccessful outbound call made today to assist with:   plumbing repairs  Outreach Attempt:  1st Attempt  Unable to leave message, voicemail if full.  Sorayah Schrodt, AAS Paralegal, [facility] Care Guide  Embedded Care Coordination [place]   Care Management  [address] [place], [place] [address] ??[email or web address]   ?? [phone]   [email or web address]

## 2021-06-11 ENCOUNTER — Telehealth: Payer: Self-pay

## 2021-06-11 NOTE — Telephone Encounter (Signed)
Telephone encounter was:  Unsuccessful.  06/11/2021 Name: Chris [name] MRN: [id number] DOB: 08-26-1960  Unsuccessful outbound call made today to assist with:   plumbing repairs  Outreach Attempt:  2nd Attempt  Unable to leave a message voicemail is full.  Waylon Hershey, AAS Paralegal, [facility] Care Guide  Embedded Care Coordination [place]   Care Management  [address] [place], [place] [address] ??[email or web address]   ?? [phone]   [email or web address]

## 2021-06-17 ENCOUNTER — Ambulatory Visit: Payer: Self-pay | Admitting: *Deleted

## 2021-06-17 ENCOUNTER — Other Ambulatory Visit: Payer: Self-pay

## 2021-06-17 ENCOUNTER — Encounter: Payer: Self-pay | Admitting: Family Medicine

## 2021-06-17 ENCOUNTER — Other Ambulatory Visit (HOSPITAL_COMMUNITY): Payer: Self-pay

## 2021-06-17 ENCOUNTER — Ambulatory Visit: Payer: Medicaid Other | Attending: Family Medicine | Admitting: Family Medicine

## 2021-06-17 VITALS — BP 171/106 | HR 81 | Temp 97.9°F | Ht 72.0 in | Wt 279.0 lb

## 2021-06-17 DIAGNOSIS — J440 Chronic obstructive pulmonary disease with acute lower respiratory infection: Secondary | ICD-10-CM

## 2021-06-17 DIAGNOSIS — R0602 Shortness of breath: Secondary | ICD-10-CM

## 2021-06-17 DIAGNOSIS — J209 Acute bronchitis, unspecified: Secondary | ICD-10-CM

## 2021-06-17 DIAGNOSIS — I10 Essential (primary) hypertension: Secondary | ICD-10-CM | POA: Diagnosis not present

## 2021-06-17 LAB — POCT INFLUENZA A/B
Influenza A, POC: NEGATIVE
Influenza B, POC: NEGATIVE

## 2021-06-17 MED ORDER — PREDNISONE 20 MG PO TABS
20.0000 mg | ORAL_TABLET | Freq: Every day | ORAL | 0 refills | Status: DC
  Filled 2021-06-17: qty 5, 5d supply, fill #0

## 2021-06-17 NOTE — Progress Notes (Signed)
Subjective:  Patient ID: Chris [name], male    DOB: 27-Jun-1960  Age: 61 y.o. MRN: [id number]  CC: Shortness of Breath   HPI Chris [name] is a 61 y.o. year old male with a history of hypertension, type 2 diabetes mellitus, stage III CKD, nonischemic cardiomyopathy (EF 30 to 35%), gout, tobacco abuse, COPD, chronic respiratory failure with hypoxia on home oxygen, obstructive sleep apnea. He had a visit with his PCP on 05/14/2021 for hospital follow-up.  Interval History: He presents with dyspnea.  O2 sat is 95%, it was 97% at his visit 1 month ago with his PCP. Dyspnea occurs with walking. He has cough productive of 'clear white stuff' and has a hard time bringing up the phlegm. He has been using his inhalers for COPD but does not have a nebulizer machine. He did have congestion which has improved. Denies presence of fever, myalgias but states when he had similar symptoms in the past he tested positive for COVID-19. Symptoms have been present for the last 24 hours. Past Medical History:  Diagnosis Date   Arrhythmia    atrial flutter   CHF (congestive heart failure) (HCC)    Chronic kidney disease    COPD (chronic obstructive pulmonary disease) ([place])    Coronary artery disease    Depression    Diabetes mellitus without complication ([place])    GERD (gastroesophageal reflux disease)    Gout    Hypertension    Influenza A with respiratory manifestations    Mental disorder     Past Surgical History:  Procedure Laterality Date   ANKLE SURGERY     CARDIAC CATHETERIZATION     CARDIOVERSION N/A 07/08/2020   Procedure: CARDIOVERSION;  Surgeon: Corey Skains, MD;  Location: [facility] ORS;  Service: Cardiovascular;  Laterality: N/A;   HERNIA REPAIR     x2   SHOULDER SURGERY     TEE WITHOUT CARDIOVERSION N/A 07/08/2020   Procedure: TRANSESOPHAGEAL ECHOCARDIOGRAM (TEE);  Surgeon: Corey Skains, MD;  Location: [facility] ORS;  Service: Cardiovascular;  Laterality: N/A;    Family History   Problem Relation Age of Onset   Heart disease Father    Diabetes Mother    HIV Brother    Healthy Son    Healthy Daughter     No Known Allergies  Outpatient Medications Prior to Visit  Medication Sig Dispense Refill   Accu-Chek Softclix Lancets lancets Use as instructed 100 each 12   albuterol (PROVENTIL) (2.5 MG/3ML) 0.083% nebulizer solution TAKE 3 MLS BY NEBULIZATION EVERY 6 (SIX) HOURS AS NEEDED FOR SHORTNESS OF BREATH. 90 mL 1   albuterol (VENTOLIN HFA) 108 (90 Base) MCG/ACT inhaler Inhale 2 puffs into the lungs every 6 (six) hours as needed for wheezing or shortness of breath. 18 g 12   allopurinol (ZYLOPRIM) 100 MG tablet TAKE 2 TABLETS (200 MG TOTAL) BY MOUTH DAILY. 180 tablet 3   apixaban (ELIQUIS) 5 MG TABS tablet TAKE 1 TABLET (5 MG TOTAL) BY MOUTH 2 (TWO) TIMES DAILY. 180 tablet 1   aspirin 81 MG EC tablet Take 1 tablet (81 mg total) by mouth daily. 100 tablet 2   atorvastatin (LIPITOR) 40 MG tablet Take 1 tablet (40 mg total) by mouth daily. 90 tablet 3   Blood Glucose Monitoring Suppl (ACCU-CHEK GUIDE) w/Device KIT Use as directed 1 kit 0   budesonide-formoterol (SYMBICORT) 160-4.5 MCG/ACT inhaler Inhale 2 puffs into the lungs 2 (two) times daily. 10.2 g 12   carvedilol (COREG) 6.25 MG  tablet Take 1 tablet (6.25 mg total) by mouth 2 (two) times daily. 180 tablet 3   colchicine 0.6 MG tablet Take 2 tabs (1.2 mg) at the onset of a gout flare, may repeat 1 tab (0.6 mg) after 2 hours if symptoms persist. 30 tablet 1   dapagliflozin propanediol (FARXIGA) 10 MG TABS tablet Take 1 tablet (10 mg total) by mouth daily. (Patient taking differently: Take 10 mg by mouth in the morning and at bedtime.) 90 tablet 3   furosemide (LASIX) 40 MG tablet Take 1 tablet (40 mg total) by mouth daily. 90 tablet 3   glipiZIDE (GLUCOTROL) 5 MG tablet Take 0.5 tablets (2.5 mg total) by mouth 2 (two) times daily before a meal. 90 tablet 3   glucose blood (ACCU-CHEK GUIDE) test strip Use as directed to  check blood sugar 1-2 times a day 100 each 12   isosorbide-hydrALAZINE (BIDIL) 20-37.5 MG tablet Take 1 tablet by mouth 3 (three) times daily.     montelukast (SINGULAIR) 10 MG tablet TAKE 1 TABLET (10 MG TOTAL) BY MOUTH AT BEDTIME. 30 tablet 2   sulfamethoxazole-trimethoprim (BACTRIM DS) 800-160 MG tablet Take 1 tablet by mouth 2 (two) times daily. 20 tablet 0   No facility-administered medications prior to visit.     ROS Review of Systems  Constitutional:  Negative for activity change and appetite change.  HENT:  Negative for sinus pressure and sore throat.   Eyes:  Negative for visual disturbance.  Respiratory:  Positive for cough and shortness of breath. Negative for chest tightness.   Cardiovascular:  Negative for chest pain and leg swelling.  Gastrointestinal:  Negative for abdominal distention, abdominal pain, constipation and diarrhea.  Endocrine: Negative.   Genitourinary:  Negative for dysuria.  Musculoskeletal:  Negative for joint swelling and myalgias.  Skin:  Negative for rash.  Allergic/Immunologic: Negative.   Neurological:  Negative for weakness, light-headedness and numbness.  Psychiatric/Behavioral:  Negative for dysphoric mood and suicidal ideas.    Objective:  BP (!) 171/106    Pulse 81    Temp 97.9 F (36.6 C) (Oral)    Ht 6' (1.829 m)    Wt 279 lb (126.6 kg)    SpO2 95%    BMI 37.84 kg/m   BP/Weight 06/17/2021 05/14/2021 79/07/4095  Systolic BP 353 299 242  Diastolic BP 683 84 419  Wt. (Lbs) 279 279 -  BMI 37.84 37.84 -  Some encounter information is confidential and restricted. Go to Review Flowsheets activity to see all data.      Physical Exam Constitutional:      Appearance: He is well-developed.  Cardiovascular:     Rate and Rhythm: Normal rate.     Heart sounds: Normal heart sounds. No murmur heard. Pulmonary:     Effort: Pulmonary effort is normal.     Breath sounds: Normal breath sounds. No wheezing or rales.     Comments: Noticed to be  wheezing at rest but on lung auscultation lungs are without wheezing Chest:     Chest wall: No tenderness.  Abdominal:     General: Bowel sounds are normal. There is no distension.     Palpations: Abdomen is soft. There is no mass.     Tenderness: There is no abdominal tenderness.  Musculoskeletal:        General: Normal range of motion.     Right lower leg: No edema.     Left lower leg: No edema.  Neurological:     Mental  Status: He is alert and oriented to person, place, and time.  Psychiatric:        Mood and Affect: Mood normal.    CMP Latest Ref Rng & Units 04/27/2021 04/15/2021 04/14/2021  Glucose 70 - 99 mg/dL 99 136(H) 290(H)  BUN 6 - 20 mg/dL 20 34(H) 22(H)  Creatinine 0.61 - 1.24 mg/dL 1.65(H) 2.12(H) 2.01(H)  Sodium 135 - 145 mmol/L 137 134(L) 133(L)  Potassium 3.5 - 5.1 mmol/L 5.0 4.8 4.3  Chloride 98 - 111 mmol/L 105 102 99  CO2 22 - 32 mmol/L 25 24 24   Calcium 8.9 - 10.3 mg/dL 9.0 9.5 9.2  Total Protein 6.5 - 8.1 g/dL 6.8 - -  Total Bilirubin 0.3 - 1.2 mg/dL 0.6 - -  Alkaline Phos 38 - 126 U/L 68 - -  AST 15 - 41 U/L 22 - -  ALT 0 - 44 U/L 23 - -    Lipid Panel     Component Value Date/Time   CHOL 176 12/25/2019 0632   CHOL 160 11/07/2019 0918   CHOL 157 10/21/2013 0435   TRIG 91 12/25/2019 0632   TRIG 117 10/21/2013 0435   HDL 37 (L) 12/25/2019 0632   HDL 40 11/07/2019 0918   HDL 30 (L) 10/21/2013 0435   CHOLHDL 4.8 12/25/2019 0632   VLDL 18 12/25/2019 0632   VLDL 23 10/21/2013 0435   LDLCALC 121 (H) 12/25/2019 0632   LDLCALC 92 11/07/2019 0918   LDLCALC 104 (H) 10/21/2013 0435    CBC    Component Value Date/Time   WBC 13.2 (H) 04/27/2021 1039   RBC 5.29 04/27/2021 1039   HGB 15.5 04/27/2021 1039   HGB 15.4 11/07/2019 0918   HCT 49.7 04/27/2021 1039   HCT 46.2 11/07/2019 0918   PLT 150 04/27/2021 1039   PLT 245 11/07/2019 0918   MCV 94.0 04/27/2021 1039   MCV 90 11/07/2019 0918   MCV 92 10/21/2013 0435   MCH 29.3 04/27/2021 1039   MCHC  31.2 04/27/2021 1039   RDW 15.7 (H) 04/27/2021 1039   RDW 14.2 11/07/2019 0918   RDW 15.7 (H) 10/21/2013 0435   LYMPHSABS 2.9 02/09/2021 0830   LYMPHSABS 1.1 10/21/2013 0435   MONOABS 1.0 02/09/2021 0830   MONOABS 0.6 10/21/2013 0435   EOSABS 0.3 02/09/2021 0830   EOSABS 0.0 10/21/2013 0435   BASOSABS 0.0 02/09/2021 0830   BASOSABS 0.0 10/21/2013 0435    Lab Results  Component Value Date   HGBA1C 6.9 (H) 02/09/2021    Assessment & Plan:  1. SOB (shortness of breath) Rapid flu test is negative We will send off COVID test Pulmonary exam is normal however due to his low oxygen saturation of 95% compared with 97% at his last office visit I will place him on a short course of prednisone - POCT Influenza A/B - Novel Coronavirus, NAA ([facility]) - predniSONE (DELTASONE) 20 MG tablet; Take 1 tablet (20 mg total) by mouth daily with breakfast.  Dispense: 5 tablet; Refill: 0  2. COPD (chronic obstructive pulmonary disease) with acute bronchitis (HCC) Will treat for possible COPD exacerbation Continue inhalers - predniSONE (DELTASONE) 20 MG tablet; Take 1 tablet (20 mg total) by mouth daily with breakfast.  Dispense: 5 tablet; Refill: 0  3. Essential hypertension Blood pressure is elevated today likely due to acute illness his blood pressure was normal at his last visit with PCP No regimen changes today Counseled on blood pressure goal of less than 130/80, low-sodium, DASH diet, medication  compliance, 150 minutes of moderate intensity exercise per week. Discussed medication compliance, adverse effects.     Meds ordered this encounter  Medications   predniSONE (DELTASONE) 20 MG tablet    Sig: Take 1 tablet (20 mg total) by mouth daily with breakfast.    Dispense:  5 tablet    Refill:  0    Follow-up: Return for Keep previously scheduled appointment with PCP.       Charlott Rakes, MD, FAAFP. [facility], [place]    06/17/2021, 5:10 PM

## 2021-06-17 NOTE — Telephone Encounter (Signed)
°  Chief Complaint: SOB with activity for 2 days Symptoms: SOB and productive cough Frequency: 2 days Pertinent Negatives: Patient denies fever/CP. Disposition: [] ED /[] Urgent Care (no appt availability in office) / [x] Appointment(In office/virtual)/ []  [facility]/ [] Home Care/ [] Refused Recommended Disposition /[] [place] [facility]/ []  Follow-up with PCP Additional Notes:     Reason for Disposition  [1] MILD difficulty breathing (e.g., minimal/no SOB at rest, SOB with walking, pulse <100) AND [2] NEW-onset or WORSE than normal  Answer Assessment - Initial Assessment Questions 1. RESPIRATORY STATUS: "Describe your breathing?" (e.g., wheezing, shortness of breath, unable to speak, severe coughing)      SOB 2. ONSET: "When did this breathing problem begin?"      One or two days ago 3. PATTERN "Does the difficult breathing come and go, or has it been constant since it started?"      Comes and goes 4. SEVERITY: "How bad is your breathing?" (e.g., mild, moderate, severe)    - MILD: No SOB at rest, mild SOB with walking, speaks normally in sentences, can lie down, no retractions, pulse < 100.    - MODERATE: SOB at rest, SOB with minimal exertion and prefers to sit, cannot lie down flat, speaks in phrases, mild retractions, audible wheezing, pulse 100-120.    - SEVERE: Very SOB at rest, speaks in single words, struggling to breathe, sitting hunched forward, retractions, pulse > 120      Only when up and about. 5. RECURRENT SYMPTOM: "Have you had difficulty breathing before?" If Yes, ask: "When was the last time?" and "What happened that time?"      no 6. CARDIAC HISTORY: "Do you have any history of heart disease?" (e.g., heart attack, angina, bypass surgery, angioplasty)      yes 7. LUNG HISTORY: "Do you have any history of lung disease?"  (e.g., pulmonary embolus, asthma, emphysema)     yes 8. CAUSE: "What do you think is causing the breathing problem?"      unsure 9. OTHER  SYMPTOMS: "Do you have any other symptoms? (e.g., dizziness, runny nose, cough, chest pain, fever)     cough 10. O2 SATURATION MONITOR:  "Do you use an oxygen saturation monitor (pulse oximeter) at home?" If Yes, "What is your reading (oxygen level) today?" "What is your usual oxygen saturation reading?" (e.g., 95%)       na 11. PREGNANCY: "Is there any chance you are pregnant?" "When was your last menstrual period?"       na 12. TRAVEL: "Have you traveled out of the country in the last month?" (e.g., travel history, exposures)       na  Protocols used: Breathing Difficulty-A-AH

## 2021-06-17 NOTE — Telephone Encounter (Signed)
Will not have Covid results available as same day. Provider will see in person. Can be swabbed in the office.

## 2021-06-18 ENCOUNTER — Telehealth: Payer: Self-pay | Admitting: Internal Medicine

## 2021-06-18 ENCOUNTER — Ambulatory Visit: Payer: Self-pay | Admitting: *Deleted

## 2021-06-18 ENCOUNTER — Other Ambulatory Visit: Payer: Self-pay | Admitting: *Deleted

## 2021-06-18 LAB — SARS-COV-2, NAA 2 DAY TAT

## 2021-06-18 LAB — NOVEL CORONAVIRUS, NAA: SARS-CoV-2, NAA: NOT DETECTED

## 2021-06-18 NOTE — Telephone Encounter (Signed)
Copied from [place] [id number]. Topic: General - Other >> Jun 18, 2021  2:49 PM Chris [name] A wrote: Reason for CRM: The patient would like to be contacted by a member of staff to go over their covid test results   Please contact further when available

## 2021-06-18 NOTE — Telephone Encounter (Signed)
Chris [name] has already spoken to pt

## 2021-06-18 NOTE — Telephone Encounter (Signed)
°  Chief Complaint: SOB Symptoms: SOB- feels like not getting enough air in Frequency: constant Pertinent Negatives: Patient denies  chest pain Disposition: [x] ED /[] Urgent Care (no appt availability in office) / [] Appointment(In office/virtual)/ []  [facility]/ [] Home Care/ [] Refused Recommended Disposition /[] [facility]/ []  Follow-up with PCP Additional Notes:

## 2021-06-18 NOTE — Patient Outreach (Signed)
Medicaid Managed Care   Nurse Care Manager Note  06/18/2021 Name:  Chris [name] MRN:  [id number] DOB:  Aug 04, 1960  Chris [name] is an 61 y.o. year old male who is a primary patient of [name], MD.  The [facility] Managed Care Coordination team was consulted for assistance with:    CHF COPD  [name] was given information about Medicaid Managed Care Coordination team services today. [name] Patient agreed to services and verbal consent obtained.  Engaged with patient by telephone for follow up visit in response to provider referral for case management and/or care coordination services.   Assessments/Interventions:  Review of past medical history, allergies, medications, health status, including review of consultants reports, laboratory and other test data, was performed as part of comprehensive evaluation and provision of chronic care management services.  SDOH (Social Determinants of Health) assessments and interventions performed: SDOH Interventions    Flowsheet Row Most Recent Value  SDOH Interventions   Housing Interventions Other (Comment)  [Collaborated with Care Guide, requested another outreach]       Care Plan  No Known Allergies  Medications Reviewed Today     Reviewed by [name], RN (Registered Nurse) on 06/18/21 at 1121  Med List Status: <None>   Medication Order Taking? Sig Documenting Provider Last Dose Status Informant  Accu-Chek Softclix Lancets lancets [id number]  Use as instructed [name], MD  Active Self  albuterol (PROVENTIL) (2.5 MG/3ML) 0.083% nebulizer solution [id number] Yes TAKE 3 MLS BY NEBULIZATION EVERY 6 (SIX) HOURS AS NEEDED FOR SHORTNESS OF BREATH. [name], MD Taking Active Self           Med Note [name] Apr 27, 2021  1:29 PM)    albuterol (VENTOLIN HFA) 108 [phone] Base) MCG/ACT inhaler [id number] Yes Inhale 2 puffs into the lungs every 6 (six) hours as needed for wheezing or shortness  of breath. [name], MD Taking Active   allopurinol (ZYLOPRIM) 100 MG tablet [id number] Yes TAKE 2 TABLETS (200 MG TOTAL) BY MOUTH DAILY. [name], MD Taking Active Self           Med Note [name] Apr 27, 2021  1:29 PM)    apixaban (ELIQUIS) 5 MG TABS tablet [id number] Yes TAKE 1 TABLET (5 MG TOTAL) BY MOUTH 2 (TWO) TIMES DAILY. [name], MD Taking Active Self           Med Note [name] Apr 27, 2021  1:26 PM)    aspirin 81 MG EC tablet [id number] Yes Take 1 tablet (81 mg total) by mouth daily. [name], MD Taking Active Self           Med Note [name] Apr 27, 2021  1:27 PM)    atorvastatin (LIPITOR) 40 MG tablet [id number] Yes Take 1 tablet (40 mg total) by mouth daily. [name], MD Taking Active Self           Med Note [name] Apr 27, 2021  1:29 PM)    Blood Glucose Monitoring Suppl (ACCU-CHEK GUIDE) w/Device [name] [id number]  Use as directed [name], MD  Active Self  budesonide-formoterol [facility]) 160-4.5 MCG/ACT inhaler [id number] Yes Inhale 2 puffs into the lungs 2 (two) times daily. [name], MD Taking Active Self  Med Note [name] Apr 27, 2021  1:29 PM)    carvedilol (COREG) 6.25 MG tablet [id number] Yes Take 1 tablet (6.25 mg total) by mouth 2 (two) times daily. Alisa Graff, FNP Taking Active Self           Med Note [name] Apr 27, 2021  1:29 PM)    colchicine 0.6 MG tablet [id number] Yes Take 2 tabs (1.2 mg) at the onset of a gout flare, may repeat 1 tab (0.6 mg) after 2 hours if symptoms persist. [name], MD Taking Active   dapagliflozin propanediol (FARXIGA) 10 MG TABS tablet [id number] Yes Take 1 tablet (10 mg total) by mouth daily.  Patient taking differently: Take 10 mg by mouth in the morning and at bedtime.   Alisa Graff, FNP Taking Active Self           Med Note [name] Apr 27, 2021   1:29 PM)    furosemide (LASIX) 40 MG tablet [id number] Yes Take 1 tablet (40 mg total) by mouth daily. Alisa Graff, FNP Taking Active Self           Med Note [name] Apr 27, 2021  1:29 PM)    glipiZIDE (GLUCOTROL) 5 MG tablet [id number] Yes Take 0.5 tablets (2.5 mg total) by mouth 2 (two) times daily before a meal. [name], MD Taking Active Self           Med Note [name] Apr 27, 2021  1:29 PM)    glucose blood (ACCU-CHEK GUIDE) test strip [id number]  Use as directed to check blood sugar 1-2 times a day [name], MD  Active Self  isosorbide-hydrALAZINE (BIDIL) 20-37.5 MG tablet [id number] Yes Take 1 tablet by mouth 3 (three) times daily. [provider] Taking Active Self    Discontinued 07/11/20 [phone] (Discontinued by provider)   montelukast (SINGULAIR) 10 MG tablet [id number] Yes TAKE 1 TABLET (10 MG TOTAL) BY MOUTH AT BEDTIME. [name], MD Taking Active   predniSONE (DELTASONE) 20 MG tablet [id number] Yes Take 1 tablet (20 mg total) by mouth daily with breakfast. Charlott Rakes, MD Taking Active   sulfamethoxazole-trimethoprim (BACTRIM DS) 800-160 MG tablet [id number] No Take 1 tablet by mouth 2 (two) times daily.  Patient not taking: Reported on 06/18/2021   [name], MD Not Taking Active             Patient Active Problem List   Diagnosis Date Noted   Right kidney mass 05/14/2021   CHF exacerbation ([place]) 04/14/2021   Chest pain 04/14/2021   Syncope 04/14/2021   Left-sided weakness 10/28/2020   COPD exacerbation (HCC)    Typical atrial flutter (HCC)    Acute respiratory failure with hypoxia ([place]) 07/05/2020   CHF (congestive heart failure) ([place]) 07/04/2020   Acute exacerbation of CHF (congestive heart failure) ([place]) 06/16/2020   COPD with acute exacerbation ([place]) 06/16/2020   Influenza vaccine refused 05/06/2020   Acute on chronic combined systolic (congestive) and diastolic (congestive) heart failure  ([place]) 05/05/2020   Acute decompensated heart failure ([place]) 05/04/2020   Illiteracy 05/04/2020   Chronic respiratory failure with hypoxia, on home oxygen therapy ([place]) 12/28/2019   Type 2 diabetes mellitus with stage 3 chronic kidney disease ([place]) 12/25/2019   Acute and chronic respiratory failure (acute-on-chronic) ([place]) 12/25/2019   Acute on chronic combined  systolic and diastolic CHF (congestive heart failure) ([place]) 10/26/2019   Elevated troponin I level 10/26/2019   Acute on chronic diastolic (congestive) heart failure ([place]) 10/26/2019   History of gout 02/01/2019   Seasonal allergic rhinitis due to pollen 02/01/2019   Tobacco dependence 11/30/2018   Microscopic hematuria 11/30/2018   Depression 11/30/2018   Difficulty controlling anger 11/30/2018   CAP (community acquired pneumonia) 08/11/2018   COPD (chronic obstructive pulmonary disease) ([place])    CKD (chronic kidney disease) stage 3, GFR 30-59 ml/min ([place]) 08/10/2018   Recurrent epistaxis 04/21/2018   Mixed hyperlipidemia 07/28/2017   Essential hypertension 44/06/270   Chronic systolic heart failure ([place]) 10/25/2014   Cocaine abuse ([place]) 02/20/2013   Cannabis abuse 02/20/2013   Back pain, chronic 02/20/2013    Conditions to be addressed/monitored per PCP order:  CHF and COPD  Care Plan : RN Care Manager Plan of Care  Updates made by [name], RN since 06/18/2021 12:00 AM     Problem: Knowledge Deficits and Care Coordination Needs related to long term management of CHF   Priority: High     Long-Range Goal: Development of Plan of Care to address Care Coordination Needs and Knowledge Deficits related to CHF   Start Date: 04/28/2021  Expected End Date: 07/27/2021  Priority: High  Note:   Current Barriers:  Knowledge Deficits related to plan of care for management of CHF  Care Coordination needs related to Transportation  Non-adherence to prescribed medication regimen Difficulty obtaining medications Mr. Hoffmann  is not feeling well today. Productive cough for 3 days, he was evaluated yesterday in the office. He is using inhalers, nebulizer and taking prednisone. He knows to go to the emergency room if his condition worsens. He missed 2 calls from [place] and request another call.  RNCM Clinical Goal(s):  Patient will verbalize understanding of plan for management of CHF as evidenced by patient verbalization of plan take all medications exactly as prescribed and will call provider for medication related questions as evidenced by physician notes in EMR    attend all scheduled medical appointments: follow up on referrals placed by PCP as evidenced by physician notes in EMR        demonstrate improved adherence to prescribed treatment plan for CHF as evidenced by attending PCP appointment and scheduling with Cardiology and HF Clinic work with pharmacist to address Medication procurement related to CHF as evidenced by review of EMR and patient or pharmacist report    through collaboration with RN Care manager, provider, and care team.   Interventions: Inter-disciplinary care team collaboration (see longitudinal plan of care) Evaluation of current treatment plan related to  self management and patient's adherence to plan as established by provider Advised patient to pick up medications and take as directed Encouraged patient to schedule appointments for all referrals placed by PCP, explained the importance of answering the phone   COPD Interventions:  (Status:  New goal.) Long Term Goal Provided patient with basic written and verbal COPD education on self care/management/and exacerbation prevention Provided instruction about proper use of medications used for management of COPD including inhalers Advised patient to self assesses COPD action plan zone and make appointment with provider if in the yellow zone for 48 hours without improvement Provided education about and advised patient to utilize infection  prevention strategies to reduce risk of respiratory infection Discussed the importance of adequate rest and management of fatigue with COPD Assessed social determinant of health barriers   Heart Failure  Interventions:  (Status: Goal on Track (progressing): YES.)  Long Term Goal  Reviewed Heart Failure Action Plan in depth and provided written copy Reviewed role of diuretics in prevention of fluid overload and management of heart failure Discussed the importance of keeping all appointments with provider Referral made to community resources care guide team for assistance with plumbing RNCM requested another outreach Assessed social determinant of health barriers Discussed the importance of following up with Cardiology appointment on 07/10/21-details provided. Patient will arrange transportation  Patient Goals/Self-Care Activities: Patient will self administer medications as prescribed as evidenced by self report/primary caregiver report  Patient will attend all scheduled provider appointments as evidenced by clinician review of documented attendance to scheduled appointments and patient/caregiver report Patient will attend church or other social activities as evidenced by patient report Patient will continue to perform ADL's independently as evidenced by patient/caregiver report Patient will continue to perform IADL's independently as evidenced by patient/caregiver report Patient will call provider office for new concerns or questions as evidenced by review of documented incoming telephone call notes and patient report Patient will work with MM Pharmacist for medication management as evidenced by notes in EMR call office if I gain more than 2 pounds in one day or 5 pounds in one week use salt in moderation watch for swelling in feet, ankles and legs every day weigh myself daily know when to call the doctor        Follow Up:  Patient agrees to Care Plan and Follow-up.  Plan: The Managed  Medicaid care management team will reach out to the patient again over the next 14 days.  Date/time of next scheduled RN care management/care coordination outreach:  07/02/21 @ 3:15pm  [name] RN, BSN [place] RN Care Coordinator

## 2021-06-18 NOTE — Patient Instructions (Signed)
Visit Information  Mr. Dani was given information about Medicaid Managed Care team care coordination services as a part of their Healthy [facility] Medicaid benefit. MELBERT BOTELHO verbally consented to engagement with the [facility] Managed Care team.   If you are experiencing a medical emergency, please call 911 or report to your local emergency department or urgent care.   If you have a non-emergency medical problem during routine business hours, please contact your provider's office and ask to speak with a nurse.   For questions related to your Healthy [facility] health plan, please call: [phone] or visit the homepage here: [email or web address]  If you would like to schedule transportation through your Healthy [facility] plan, please call the following number at least 2 days in advance of your appointment: [phone]  Call the [facility] at [phone], at any time, 24 hours a day, 7 days a week. If you are in danger or need immediate medical attention call 911.  If you would like help to quit smoking, call 1-800-QUIT-NOW 6614048126) OR Espaol: 1-855-Djelo-Ya (7-001-749-4496) o para ms informacin haga clic aqu or Text READY to 200-400 to register via text  Mr. Krist - following are the goals we discussed in your visit today:   Goals Addressed   None     Please see education materials related to CHF and COPD provided by MyChart link. and as Advertising account planner.   The patient verbalized understanding of instructions provided today and agreed to receive a mailed copy of patient instruction and/or educational materials.  Telephone follow up appointment with Managed Medicaid care management team member scheduled for:07/02/21 @ 3:15pm  [name] RN, BSN [place] RN Care Coordinator   Following is a copy of your plan of care:  Care Plan : RN Care Manager Plan of Care  Updates made  by [name], RN since 06/18/2021 12:00 AM     Problem: Knowledge Deficits and Care Coordination Needs related to long term management of CHF   Priority: High     Long-Range Goal: Development of Plan of Care to address Care Coordination Needs and Knowledge Deficits related to CHF   Start Date: 04/28/2021  Expected End Date: 07/27/2021  Priority: High  Note:   Current Barriers:  Knowledge Deficits related to plan of care for management of CHF  Care Coordination needs related to Transportation  Non-adherence to prescribed medication regimen Difficulty obtaining medications Mr. Faria is not feeling well today. Productive cough for 3 days, he was evaluated yesterday in the office. He is using inhalers, nebulizer and taking prednisone. He knows to go to the emergency room if his condition worsens. He missed 2 calls from [place] and request another call.  RNCM Clinical Goal(s):  Patient will verbalize understanding of plan for management of CHF as evidenced by patient verbalization of plan take all medications exactly as prescribed and will call provider for medication related questions as evidenced by physician notes in EMR    attend all scheduled medical appointments: follow up on referrals placed by PCP as evidenced by physician notes in EMR        demonstrate improved adherence to prescribed treatment plan for CHF as evidenced by attending PCP appointment and scheduling with Cardiology and HF Clinic work with pharmacist to address Medication procurement related to CHF as evidenced by review of EMR and patient or pharmacist report    through collaboration with RN Care manager, provider, and care team.  Interventions: Inter-disciplinary care team collaboration (see longitudinal plan of care) Evaluation of current treatment plan related to  self management and patient's adherence to plan as established by provider Advised patient to pick up medications and take as  directed Encouraged patient to schedule appointments for all referrals placed by PCP, explained the importance of answering the phone   COPD Interventions:  (Status:  New goal.) Long Term Goal Provided patient with basic written and verbal COPD education on self care/management/and exacerbation prevention Provided instruction about proper use of medications used for management of COPD including inhalers Advised patient to self assesses COPD action plan zone and make appointment with provider if in the yellow zone for 48 hours without improvement Provided education about and advised patient to utilize infection prevention strategies to reduce risk of respiratory infection Discussed the importance of adequate rest and management of fatigue with COPD Assessed social determinant of health barriers   Heart Failure Interventions:  (Status: Goal on Track (progressing): YES.)  Long Term Goal  Reviewed Heart Failure Action Plan in depth and provided written copy Reviewed role of diuretics in prevention of fluid overload and management of heart failure Discussed the importance of keeping all appointments with provider Referral made to community resources care guide team for assistance with plumbing RNCM requested another outreach Assessed social determinant of health barriers Discussed the importance of following up with Cardiology appointment on 07/10/21-details provided. Patient will arrange transportation  Patient Goals/Self-Care Activities: Patient will self administer medications as prescribed as evidenced by self report/primary caregiver report  Patient will attend all scheduled provider appointments as evidenced by clinician review of documented attendance to scheduled appointments and patient/caregiver report Patient will attend church or other social activities as evidenced by patient report Patient will continue to perform ADL's independently as evidenced by patient/caregiver report Patient  will continue to perform IADL's independently as evidenced by patient/caregiver report Patient will call provider office for new concerns or questions as evidenced by review of documented incoming telephone call notes and patient report Patient will work with MM Pharmacist for medication management as evidenced by notes in EMR call office if I gain more than 2 pounds in one day or 5 pounds in one week use salt in moderation watch for swelling in feet, ankles and legs every day weigh myself daily know when to call the doctor

## 2021-06-18 NOTE — Telephone Encounter (Signed)
Reason for Disposition  [1] MODERATE difficulty breathing (e.g., speaks in phrases, SOB even at rest, pulse 100-120) AND [2] NEW-onset or WORSE than normal  Answer Assessment - Initial Assessment Questions 1. RESPIRATORY STATUS: "Describe your breathing?" (e.g., wheezing, shortness of breath, unable to speak, severe coughing)      Finding it hard to get deep breath in- not getting enough air in, dizziness 2. ONSET: "When did this breathing problem begin?"      Prolonged symptoms- worse 3. PATTERN "Does the difficult breathing come and go, or has it been constant since it started?"      constant 4. SEVERITY: "How bad is your breathing?" (e.g., mild, moderate, severe)    - MILD: No SOB at rest, mild SOB with walking, speaks normally in sentences, can lie down, no retractions, pulse < 100.    - MODERATE: SOB at rest, SOB with minimal exertion and prefers to sit, cannot lie down flat, speaks in phrases, mild retractions, audible wheezing, pulse 100-120.    - SEVERE: Very SOB at rest, speaks in single words, struggling to breathe, sitting hunched forward, retractions, pulse > 120      Mild/moderate 5. RECURRENT SYMPTOM: "Have you had difficulty breathing before?" If Yes, ask: "When was the last time?" and "What happened that time?"      Yes- awaiting referral 6. CARDIAC HISTORY: "Do you have any history of heart disease?" (e.g., heart attack, angina, bypass surgery, angioplasty)      *No Answer* 7. LUNG HISTORY: "Do you have any history of lung disease?"  (e.g., pulmonary embolus, asthma, emphysema)     *No Answer* 8. CAUSE: "What do you think is causing the breathing problem?"      *No Answer* 9. OTHER SYMPTOMS: "Do you have any other symptoms? (e.g., dizziness, runny nose, cough, chest pain, fever)     *No Answer* 10. O2 SATURATION MONITOR:  "Do you use an oxygen saturation monitor (pulse oximeter) at home?" If Yes, "What is your reading (oxygen level) today?" "What is your usual oxygen  saturation reading?" (e.g., 95%)       *No Answer* 11. PREGNANCY: "Is there any chance you are pregnant?" "When was your last menstrual period?"       *No Answer* 12. TRAVEL: "Have you traveled out of the country in the last month?" (e.g., travel history, exposures)       *No Answer*  Protocols used: Breathing Difficulty-A-AH

## 2021-06-19 ENCOUNTER — Other Ambulatory Visit (HOSPITAL_COMMUNITY): Payer: Self-pay

## 2021-06-19 ENCOUNTER — Telehealth: Payer: Self-pay

## 2021-06-19 NOTE — Telephone Encounter (Signed)
Telephone encounter was:  Successful.  06/19/2021 Name: Chris [name] MRN: [id number] DOB: 1960-12-19  Chris [name] is a 61 y.o. year old male who is a primary care patient of Myrtle, Pitcock, MD . The community resource team was consulted for assistance with Financial Difficulties related to plumbing repairs.  Care guide performed the following interventions: Spoke with patient received permission to submit NCCARE360 referral to [facility] [employer] for assistance with plumbing repairs.   Follow Up Plan:  Care guide will follow up with patient by phone over the next 7-10 days.  Talitha Dicarlo, AAS Paralegal, [facility] Care Guide  Embedded Care Coordination [place]   Care Management  [address] [place], [place] [address] ??[email or web address]   ?? [phone]   [email or web address]

## 2021-06-23 ENCOUNTER — Telehealth: Payer: Self-pay | Admitting: Internal Medicine

## 2021-06-23 NOTE — Telephone Encounter (Signed)
Copied from [place] [id number]. Topic: General - Other >> Jun 23, 2021  7:59 AM Leward Quan A wrote: Reason for CRM: Patient called in to ask Dr Wynetta Emery for a printed copy of his Covid test taken on 06/17/2021 say that he need it to give to someone did not elaborate. Please call today at Ph# [phone]

## 2021-06-23 NOTE — Telephone Encounter (Signed)
Contacted pt and made aware that I have printed results and he can pick up at the front desk. Pt states he will pick it up tomorrow morning

## 2021-06-26 ENCOUNTER — Telehealth: Payer: Self-pay

## 2021-06-26 NOTE — Telephone Encounter (Signed)
Telephone encounter was:  Successful.  06/26/2021 Name: Chris [name] MRN: [id number] DOB: 1961-04-06  Chris [name] is a 61 y.o. year old male who is a primary care patient of [name], MD . The community resource team was consulted for assistance with  plumbing repairs.  Care guide performed the following interventions: Per NCCARE360 message [facility] Trixie Dredge has been assigned and will be reaching out to the patient in the next few days.   Follow Up Plan:  No further follow up planned at this time. The patient has been provided with needed resources.  Hebe Merriwether, AAS Paralegal, [facility] Care Guide  Embedded Care Coordination [place]   Care Management  [address] [place], [place] [address] [email or web address]   ?? [phone]   [email or web address]

## 2021-07-02 ENCOUNTER — Other Ambulatory Visit: Payer: Self-pay

## 2021-07-02 ENCOUNTER — Other Ambulatory Visit: Payer: Self-pay | Admitting: *Deleted

## 2021-07-02 NOTE — Patient Outreach (Signed)
Medicaid Managed Care   Nurse Care Manager Note  07/02/2021 Name:  Chris [name] MRN:  [id number] DOB:  [birth year]  Chris [name] is an 61 y.o. year old male who is a primary patient of Chris Pier, MD.  The [facility] Managed Care Coordination team was consulted for assistance with:    CHF  Mr. Murdaugh was given information about Medicaid Managed Care Coordination team services today. [name] Patient agreed to services and verbal consent obtained.  Engaged with patient by telephone for follow up visit in response to provider referral for case management and/or care coordination services.   Assessments/Interventions:  Review of past medical history, allergies, medications, health status, including review of consultants reports, laboratory and other test data, was performed as part of comprehensive evaluation and provision of chronic care management services.  SDOH (Social Determinants of Health) assessments and interventions performed: SDOH Interventions    Flowsheet Row Most Recent Value  SDOH Interventions   Transportation Interventions Intervention Not Indicated       Care Plan  No Known Allergies  Medications Reviewed Today     Reviewed by [name], RN (Registered Nurse) on 07/02/21 at 1548  Med List Status: <None>   Medication Order Taking? Sig Documenting Provider Last Dose Status Informant  Accu-Chek Softclix Lancets lancets [id number] No Use as instructed Chris Pier, MD Taking Active Self  albuterol (PROVENTIL) (2.5 MG/3ML) 0.083% nebulizer solution [id number] No TAKE 3 MLS BY NEBULIZATION EVERY 6 (SIX) HOURS AS NEEDED FOR SHORTNESS OF BREATH. Chris Pier, MD Taking Active Self           Med Note Chris [name] Apr 27, 2021  1:29 PM)    albuterol (VENTOLIN HFA) 108 [phone] Base) MCG/ACT inhaler [id number] No Inhale 2 puffs into the lungs every 6 (six) hours as needed for wheezing or shortness of breath. Chris Pier, MD Taking  Active   allopurinol (ZYLOPRIM) 100 MG tablet [id number] No TAKE 2 TABLETS (200 MG TOTAL) BY MOUTH DAILY. Chris Pier, MD Taking Active Self           Med Note Chris [name] Apr 27, 2021  1:29 PM)    apixaban (ELIQUIS) 5 MG TABS tablet [id number] No TAKE 1 TABLET (5 MG TOTAL) BY MOUTH 2 (TWO) TIMES DAILY. Chris Pier, MD Taking Active Self           Med Note Chris [name] Apr 27, 2021  1:26 PM)    aspirin 81 MG EC tablet [id number] No Take 1 tablet (81 mg total) by mouth daily. Chris Pier, MD Taking Active Self           Med Note Chris [name] Apr 27, 2021  1:27 PM)    atorvastatin (LIPITOR) 40 MG tablet [id number] No Take 1 tablet (40 mg total) by mouth daily. Chris Pier, MD Taking Active Self           Med Note Chris [name] Apr 27, 2021  1:29 PM)    Blood Glucose Monitoring Suppl (ACCU-CHEK GUIDE) w/Device [name] [id number] No Use as directed Chris Pier, MD Taking Active Self  budesonide-formoterol [facility]) 160-4.5 MCG/ACT inhaler [id number] No Inhale 2 puffs into the lungs 2 (two) times daily. Chris Pier, MD Taking Active Self           Med Note Chris [name], [place] [facility]  Mon Apr 27, 2021  1:29 PM)    carvedilol (COREG) 6.25 MG tablet [id number] No Take 1 tablet (6.25 mg total) by mouth 2 (two) times daily. Chris Graff, FNP Taking Active Self           Med Note Chris [name] Apr 27, 2021  1:29 PM)    colchicine 0.6 MG tablet [id number] No Take 2 tabs (1.2 mg) at the onset of a gout flare, may repeat 1 tab (0.6 mg) after 2 hours if symptoms persist. Chris Pier, MD Taking Active   dapagliflozin propanediol (FARXIGA) 10 MG TABS tablet [id number] No Take 1 tablet (10 mg total) by mouth daily.  Patient taking differently: Take 10 mg by mouth in the morning and at bedtime.   Chris Graff, FNP Taking Active Self           Med Note Chris [name] Apr 27, 2021  1:29 PM)    furosemide (LASIX) 40 MG  tablet [id number] No Take 1 tablet (40 mg total) by mouth daily. Chris Graff, FNP Taking Active Self           Med Note Chris [name] Apr 27, 2021  1:29 PM)    glipiZIDE (GLUCOTROL) 5 MG tablet [id number] No Take 0.5 tablets (2.5 mg total) by mouth 2 (two) times daily before a meal. Chris Pier, MD Taking Active Self           Med Note Chris [name] Apr 27, 2021  1:29 PM)    glucose blood (ACCU-CHEK GUIDE) test strip [id number] No Use as directed to check blood sugar 1-2 times a day Chris Pier, MD Taking Active Self  isosorbide-hydrALAZINE (BIDIL) 20-37.5 MG tablet [id number] No Take 1 tablet by mouth 3 (three) times daily. [provider] Taking Active Self  Discontinued 07/11/20 [phone] (Discontinued by provider)   montelukast (SINGULAIR) 10 MG tablet [id number] No TAKE 1 TABLET (10 MG TOTAL) BY MOUTH AT BEDTIME. Chris Pier, MD Taking Active   predniSONE (DELTASONE) 20 MG tablet [id number] No Take 1 tablet (20 mg total) by mouth daily with breakfast. Chris Rakes, MD Taking Active   sulfamethoxazole-trimethoprim (BACTRIM DS) 800-160 MG tablet [id number] No Take 1 tablet by mouth 2 (two) times daily.  Patient not taking: Reported on 06/18/2021   Chris Pier, MD Not Taking Active             Patient Active Problem List   Diagnosis Date Noted   Right kidney mass 05/14/2021   CHF exacerbation ([place]) 04/14/2021   Chest pain 04/14/2021   Syncope 04/14/2021   Left-sided weakness 10/28/2020   COPD exacerbation (HCC)    Typical atrial flutter (HCC)    Acute respiratory failure with hypoxia ([place]) 07/05/2020   CHF (congestive heart failure) ([place]) 07/04/2020   Acute exacerbation of CHF (congestive heart failure) ([place]) 06/16/2020   COPD with acute exacerbation ([place]) 06/16/2020   Influenza vaccine refused 05/06/2020   Acute on chronic combined systolic (congestive) and diastolic (congestive) heart failure ([place]) 05/05/2020   Acute  decompensated heart failure ([place]) 05/04/2020   Illiteracy 05/04/2020   Chronic respiratory failure with hypoxia, on home oxygen therapy ([place]) 12/28/2019   Type 2 diabetes mellitus with stage 3 chronic kidney disease ([place]) 12/25/2019   Acute and chronic respiratory failure (acute-on-chronic) ([place]) 12/25/2019   Acute on chronic combined systolic and diastolic CHF (congestive heart failure) ([place]) 10/26/2019  Elevated troponin I level 10/26/2019   Acute on chronic diastolic (congestive) heart failure ([place]) 10/26/2019   History of gout 02/01/2019   Seasonal allergic rhinitis due to pollen 02/01/2019   Tobacco dependence 11/30/2018   Microscopic hematuria 11/30/2018   Depression 11/30/2018   Difficulty controlling anger 11/30/2018   CAP (community acquired pneumonia) 08/11/2018   COPD (chronic obstructive pulmonary disease) ([place])    CKD (chronic kidney disease) stage 3, GFR 30-59 ml/min ([place]) 08/10/2018   Recurrent epistaxis 04/21/2018   Mixed hyperlipidemia 07/28/2017   Essential hypertension 70/06/7492   Chronic systolic heart failure ([place]) 10/25/2014   Cocaine abuse ([place]) 02/20/2013   Cannabis abuse 02/20/2013   Back pain, chronic 02/20/2013    Conditions to be addressed/monitored per PCP order:  CHF  Care Plan : RN Care Manager Plan of Care  Updates made by [name], RN since 07/02/2021 12:00 AM     Problem: Knowledge Deficits and Care Coordination Needs related to long term management of CHF   Priority: High     Long-Range Goal: Development of Plan of Care to address Care Coordination Needs and Knowledge Deficits related to CHF   Start Date: 04/28/2021  Expected End Date: 07/27/2021  Priority: High  Note:   Current Barriers:  Knowledge Deficits related to plan of care for management of CHF  Care Coordination needs related to Transportation  Non-adherence to prescribed medication regimen Difficulty obtaining medications Mr. Thal is feeling better today. Reports  having transportation to his Cardiology appointment on 07/10/21.  RNCM Clinical Goal(s):  Patient will verbalize understanding of plan for management of CHF as evidenced by patient verbalization of plan take all medications exactly as prescribed and will call provider for medication related questions as evidenced by physician notes in EMR    attend all scheduled medical appointments: follow up on referrals placed by PCP as evidenced by physician notes in EMR        demonstrate improved adherence to prescribed treatment plan for CHF as evidenced by attending Cardiology 07/10/21 at 9:25am work with pharmacist to address Medication procurement related to CHF as evidenced by review of EMR and patient or pharmacist report    through collaboration with RN Care manager, provider, and care team.   Interventions: Inter-disciplinary care team collaboration (see longitudinal plan of care) Evaluation of current treatment plan related to  self management and patient's adherence to plan as established by provider Advised patient to pick up medications and take as directed   COPD Interventions:  (Status:  Condition stable.  Not addressed this visit.) Long Term Goal Provided patient with basic written and verbal COPD education on self care/management/and exacerbation prevention Provided instruction about proper use of medications used for management of COPD including inhalers Advised patient to self assesses COPD action plan zone and make appointment with provider if in the yellow zone for 48 hours without improvement Provided education about and advised patient to utilize infection prevention strategies to reduce risk of respiratory infection Discussed the importance of adequate rest and management of fatigue with COPD Assessed social determinant of health barriers   Heart Failure Interventions:  (Status: Goal on Track (progressing): YES.)  Long Term Goal  Reviewed Heart Failure Action Plan in depth and  provided written copy Reviewed role of diuretics in prevention of fluid overload and management of heart failure Discussed the importance of keeping all appointments with provider Referral made to community resources care guide team for assistance with plumbing RNCM requested another outreach Assessed social determinant  of health barriers Discussed the importance of following up with Cardiology appointment on 07/10/21-details provided. Patient will arrange transportation Advised patient to take all medications to Cardiology appointment  Patient Goals/Self-Care Activities: Patient will self administer medications as prescribed as evidenced by self report/primary caregiver report  Patient will attend all scheduled provider appointments as evidenced by clinician review of documented attendance to scheduled appointments and patient/caregiver report Patient will attend church or other social activities as evidenced by patient report Patient will continue to perform ADL's independently as evidenced by patient/caregiver report Patient will continue to perform IADL's independently as evidenced by patient/caregiver report Patient will call provider office for new concerns or questions as evidenced by review of documented incoming telephone call notes and patient report Patient will work with MM Pharmacist for medication management as evidenced by notes in EMR call office if I gain more than 2 pounds in one day or 5 pounds in one week use salt in moderation watch for swelling in feet, ankles and legs every day weigh myself daily know when to call the doctor        Follow Up:  Patient agrees to Care Plan and Follow-up.  Plan: The Managed Medicaid care management team will reach out to the patient again over the next 14 days.  Date/time of next scheduled RN care management/care coordination outreach:  07/16/21 @ 1:15pm  [name] RN, BSN [place]   [employer] surveyor

## 2021-07-02 NOTE — Patient Instructions (Signed)
Visit Information  [name] was given information about Medicaid Managed Care team care coordination services as a part of their Healthy [facility] Medicaid benefit. Chris [name] verbally consented to engagement with the [facility] Managed Care team.   If you are experiencing a medical emergency, please call 911 or report to your local emergency department or urgent care.   If you have a non-emergency medical problem during routine business hours, please contact your provider's office and ask to speak with a nurse.   For questions related to your Healthy [facility] health plan, please call: [phone] or visit the homepage here: [email or web address]  If you would like to schedule transportation through your Healthy [facility] plan, please call the following number at least 2 days in advance of your appointment: [phone]  Call the [place] at [phone], at any time, 24 hours a day, 7 days a week. If you are in danger or need immediate medical attention call 911.  If you would like help to quit smoking, call 1-800-QUIT-NOW 7126831810) OR Espaol: 1-855-Djelo-Ya (2-119-417-4081) o para ms informacin haga clic aqu or Text READY to 200-400 to register via text  Chris [name] - following are the goals we discussed in your visit today:   Goals Addressed   None     Please see education materials related to heart failure provided by MyChart link.  The patient verbalized understanding of instructions provided today and agreed to receive a mailed copy of patient instruction and/or educational materials.  Telephone follow up appointment with Managed Medicaid care management team member scheduled for:07/16/21 @ 1:15pm  [name] RN, BSN [place] RN Care Coordinator   Following is a copy of your plan of care:  Care Plan : RN Care Manager Plan of Care  Updates made by [name], RN  since 07/02/2021 12:00 AM     Problem: Knowledge Deficits and Care Coordination Needs related to long term management of CHF   Priority: High     Long-Range Goal: Development of Plan of Care to address Care Coordination Needs and Knowledge Deficits related to CHF   Start Date: 04/28/2021  Expected End Date: 07/27/2021  Priority: High  Note:   Current Barriers:  Knowledge Deficits related to plan of care for management of CHF  Care Coordination needs related to Transportation  Non-adherence to prescribed medication regimen Difficulty obtaining medications Chris [name] is feeling better today. Reports having transportation to his Cardiology appointment on 07/10/21.  RNCM Clinical Goal(s):  Patient will verbalize understanding of plan for management of CHF as evidenced by patient verbalization of plan take all medications exactly as prescribed and will call provider for medication related questions as evidenced by physician notes in EMR    attend all scheduled medical appointments: follow up on referrals placed by PCP as evidenced by physician notes in EMR        demonstrate improved adherence to prescribed treatment plan for CHF as evidenced by attending Cardiology 07/10/21 at 9:25am work with pharmacist to address Medication procurement related to CHF as evidenced by review of EMR and patient or pharmacist report    through collaboration with RN Care manager, provider, and care team.   Interventions: Inter-disciplinary care team collaboration (see longitudinal plan of care) Evaluation of current treatment plan related to  self management and patient's adherence to plan as established by provider Advised patient to pick up medications and take as directed   COPD Interventions:  (Status:  Condition stable.  Not addressed this visit.) Long Term Goal Provided patient with basic written and verbal COPD education on self care/management/and exacerbation prevention Provided instruction about  proper use of medications used for management of COPD including inhalers Advised patient to self assesses COPD action plan zone and make appointment with provider if in the yellow zone for 48 hours without improvement Provided education about and advised patient to utilize infection prevention strategies to reduce risk of respiratory infection Discussed the importance of adequate rest and management of fatigue with COPD Assessed social determinant of health barriers   Heart Failure Interventions:  (Status: Goal on Track (progressing): YES.)  Long Term Goal  Reviewed Heart Failure Action Plan in depth and provided written copy Reviewed role of diuretics in prevention of fluid overload and management of heart failure Discussed the importance of keeping all appointments with provider Referral made to community resources care guide team for assistance with plumbing RNCM requested another outreach Assessed social determinant of health barriers Discussed the importance of following up with Cardiology appointment on 07/10/21-details provided. Patient will arrange transportation Advised patient to take all medications to Cardiology appointment  Patient Goals/Self-Care Activities: Patient will self administer medications as prescribed as evidenced by self report/primary caregiver report  Patient will attend all scheduled provider appointments as evidenced by clinician review of documented attendance to scheduled appointments and patient/caregiver report Patient will attend church or other social activities as evidenced by patient report Patient will continue to perform ADL's independently as evidenced by patient/caregiver report Patient will continue to perform IADL's independently as evidenced by patient/caregiver report Patient will call provider office for new concerns or questions as evidenced by review of documented incoming telephone call notes and patient report Patient will work with MM  Pharmacist for medication management as evidenced by notes in EMR call office if I gain more than 2 pounds in one day or 5 pounds in one week use salt in moderation watch for swelling in feet, ankles and legs every day weigh myself daily know when to call the doctor

## 2021-07-08 ENCOUNTER — Other Ambulatory Visit: Payer: Self-pay

## 2021-07-09 ENCOUNTER — Other Ambulatory Visit: Payer: Self-pay

## 2021-07-10 ENCOUNTER — Other Ambulatory Visit: Payer: Self-pay

## 2021-07-10 ENCOUNTER — Telehealth: Payer: Self-pay | Admitting: Cardiology

## 2021-07-10 ENCOUNTER — Ambulatory Visit (INDEPENDENT_AMBULATORY_CARE_PROVIDER_SITE_OTHER): Payer: Medicaid Other | Admitting: Cardiology

## 2021-07-10 ENCOUNTER — Encounter: Payer: Self-pay | Admitting: Cardiology

## 2021-07-10 VITALS — BP 110/70 | HR 76 | Ht 72.0 in | Wt 275.0 lb

## 2021-07-10 DIAGNOSIS — I10 Essential (primary) hypertension: Secondary | ICD-10-CM

## 2021-07-10 DIAGNOSIS — F17200 Nicotine dependence, unspecified, uncomplicated: Secondary | ICD-10-CM

## 2021-07-10 DIAGNOSIS — I5020 Unspecified systolic (congestive) heart failure: Secondary | ICD-10-CM | POA: Diagnosis not present

## 2021-07-10 DIAGNOSIS — I4892 Unspecified atrial flutter: Secondary | ICD-10-CM | POA: Diagnosis not present

## 2021-07-10 MED ORDER — TORSEMIDE 20 MG PO TABS
40.0000 mg | ORAL_TABLET | Freq: Every day | ORAL | 3 refills | Status: DC
  Filled 2021-07-10: qty 60, 30d supply, fill #0
  Filled 2021-08-31: qty 60, 30d supply, fill #1
  Filled 2021-10-09: qty 60, 30d supply, fill #2
  Filled 2021-11-30: qty 60, 30d supply, fill #3

## 2021-07-10 MED ORDER — TORSEMIDE 40 MG PO TABS
40.0000 mg | ORAL_TABLET | Freq: Every day | ORAL | 3 refills | Status: DC
  Filled 2021-07-10: qty 30, fill #0

## 2021-07-10 NOTE — Patient Instructions (Addendum)
Medication Instructions:   Your physician has recommended you make the following change in your medication:   STOP taking Furosemide (Lasix).  2.   START Torsemide 40 MG once a day.  *If you need a refill on your cardiac medications before your next appointment, please call your pharmacy*   Lab Work:  Your physician recommends that you return for lab work (BMP) in: 1 week    Please return to our office on_____________________at______________am/pm    Testing/Procedures:  [place] caregiver has ordered a Stress Test with nuclear imaging. The purpose of this test is to evaluate the blood supply to your heart muscle. This procedure is referred to as a "Non-Invasive Stress Test." This is because other than having an IV started in your vein, nothing is inserted or "invades" your body. Cardiac stress tests are done to find areas of poor blood flow to the heart by determining the extent of coronary artery disease (CAD). Some patients exercise on a treadmill, which naturally increases the blood flow to your heart, while others who are  unable to walk on a treadmill due to physical limitations have a pharmacologic/chemical stress agent called Lexiscan . This medicine will mimic walking on a treadmill by temporarily increasing your coronary blood flow.      PLEASE REPORT TO [facility]ICAL [facility] ENTRANCE   THE VOLUNTEERS AT THE FIRST DESK WILL DIRECT YOU WHERE TO GO     *Please note: these test may take anywhere between 2-4 hours to complete       Date of Procedure:_____________________________________ (2 weeks from office visit)  Arrival Time for Procedure:______________________________    PLEASE NOTIFY THE OFFICE AT LEAST 24 HOURS IN ADVANCE IF YOU ARE UNABLE TO [place].  [name] 24 HOURS IN ADVANCE IF YOU ARE UNABLE TO KEEP YOUR APPOINTMENT. [phone]       .ctcc  How to prepare for  your Myoview test:         _XX___:  Hold diabetes medication the morning of procedure: glipiZIDE (GLUCOTROL) 5 MG tablet   _XX___:  Hold other medications as follows: Torsemide    1. Do not eat or drink after midnight  2. No caffeine for 24 hours prior to test  3. No smoking 24 hours prior to test.  4. Unless instructed otherwise, Take your medication with a small sips of water.    5.         Ladies, please do not wear dresses. Skirts or pants are appropriate. Please wear a short sleeve shirt.  6. No perfume, cologne or lotion.  7. Wear comfortable walking shoes. No heels!     Follow-Up: At [facility], you and your health needs are our priority.  As part of our continuing mission to provide you with exceptional heart care, we have created designated Provider Care Teams.  These Care Teams include your primary Cardiologist (physician) and Advanced Practice Providers (APPs -  Physician Assistants and Nurse Practitioners) who all work together to provide you with the care you need, when you need it.  We recommend signing up for the patient portal called "MyChart".  Sign up information is provided on this After Visit Summary.  MyChart is used to connect with patients for Virtual Visits (Telemedicine).  Patients are able to view lab/test results, encounter notes, upcoming appointments, etc.  Non-urgent messages can be sent to your provider as well.  To learn more about what you can do with MyChart, go to [email or web address].    Your next appointment:   Follow up after testing   The format for your next appointment:   In Person  Provider:    [name]

## 2021-07-10 NOTE — Telephone Encounter (Signed)
Called pharmacy and they only have the Torsemide 20 MG tablets available. Resent prescription for 20 MG tablets, take 2 once a day.

## 2021-07-10 NOTE — Telephone Encounter (Signed)
Pharmacy states Torsemide 40mg  not available please send new order/dosage

## 2021-07-10 NOTE — Progress Notes (Signed)
Cardiology Office Note:    Date:  07/10/2021   ID:  Chris [name], DOB September 11, 1960, MRN [id number]  PCP:  [name], MD  [facility] Cardiologist:   [facility] Electrophysiologist:  None   Referring MD: [name], MD   Chief Complaint  Patient presents with   Other    Referred by Dr. Wynetta Emery for Chronic combined systolic and diastolic CHF. Meds reviewed verbally with patient.     History of Present Illness:    Chris [name] is a 61 y.o. male with a hx of hypertension, obesity, HFrEF EF 25%, atrial flutter s/p DCCV 06/2020, COPD, current smoker x40+ years, former cocaine use x25+ years, CKD who presents for follow-up.    Patient was last seen a year ago due to HFrEF.  Plan was for patient to follow-up with [facility] cardiology due to previously seen there.  From what it appears, patient did not follow-up.  He presents for chronic management of HFrEF.  Recently quit using cocaine 3 months ago.  States having shortness of breath and cough when he lays flat.  Also complains of abdominal distention.  Denies leg edema.  He still smokes.  States being compliant with current medications including Lasix.   Prior notes Suddenly admitted for shortness of breath, found to be in atrial flutter with RVR.   Underwent a TEE guided DC cardioversion 06/2020 .  Echocardiogram obtained 06/2020 while in the hospital showed EF of 25%.  left heart cath 2005 at [place] years ago with no obstructive disease.  Past Medical History:  Diagnosis Date   Arrhythmia    atrial flutter   CHF (congestive heart failure) (HCC)    Chronic kidney disease    COPD (chronic obstructive pulmonary disease) ([place])    Coronary artery disease    Depression    Diabetes mellitus without complication ([place])    GERD (gastroesophageal reflux disease)    Gout    Hypertension    Influenza A with respiratory manifestations    Mental disorder     Past Surgical History:  Procedure Laterality Date   ANKLE  SURGERY     CARDIAC CATHETERIZATION     CARDIOVERSION N/A 07/08/2020   Procedure: CARDIOVERSION;  Surgeon: Corey Skains, MD;  Location: [facility] ORS;  Service: Cardiovascular;  Laterality: N/A;   HERNIA REPAIR     x2   SHOULDER SURGERY     TEE WITHOUT CARDIOVERSION N/A 07/08/2020   Procedure: TRANSESOPHAGEAL ECHOCARDIOGRAM (TEE);  Surgeon: Corey Skains, MD;  Location: [facility] ORS;  Service: Cardiovascular;  Laterality: N/A;    Current Medications: Current Meds  Medication Sig   Accu-Chek Softclix Lancets lancets Use as instructed   albuterol (PROVENTIL) (2.5 MG/3ML) 0.083% nebulizer solution TAKE 3 MLS BY NEBULIZATION EVERY 6 (SIX) HOURS AS NEEDED FOR SHORTNESS OF BREATH.   albuterol (VENTOLIN HFA) 108 (90 Base) MCG/ACT inhaler Inhale 2 puffs into the lungs every 6 (six) hours as needed for wheezing or shortness of breath.   allopurinol (ZYLOPRIM) 100 MG tablet TAKE 2 TABLETS (200 MG TOTAL) BY MOUTH DAILY.   apixaban (ELIQUIS) 5 MG TABS tablet TAKE 1 TABLET (5 MG TOTAL) BY MOUTH 2 (TWO) TIMES DAILY.   aspirin 81 MG EC tablet Take 1 tablet (81 mg total) by mouth daily.   atorvastatin (LIPITOR) 40 MG tablet Take 1 tablet (40 mg total) by mouth daily.   Blood Glucose Monitoring Suppl (ACCU-CHEK GUIDE) w/Device KIT Use as directed   budesonide-formoterol (SYMBICORT) 160-4.5 MCG/ACT inhaler  Inhale 2 puffs into the lungs 2 (two) times daily.   carvedilol (COREG) 6.25 MG tablet Take 1 tablet (6.25 mg total) by mouth 2 (two) times daily.   colchicine 0.6 MG tablet Take 2 tabs (1.2 mg) at the onset of a gout flare, may repeat 1 tab (0.6 mg) after 2 hours if symptoms persist.   dapagliflozin propanediol (FARXIGA) 10 MG TABS tablet Take 1 tablet (10 mg total) by mouth daily.   glipiZIDE (GLUCOTROL) 5 MG tablet Take 0.5 tablets (2.5 mg total) by mouth 2 (two) times daily before a meal.   glucose blood (ACCU-CHEK GUIDE) test strip Use as directed to check blood sugar 1-2 times a day    isosorbide-hydrALAZINE (BIDIL) 20-37.5 MG tablet TAKE 1 TABLET BY MOUTH 3 (THREE) TIMES DAILY.   montelukast (SINGULAIR) 10 MG tablet TAKE 1 TABLET (10 MG TOTAL) BY MOUTH AT BEDTIME.   predniSONE (DELTASONE) 20 MG tablet Take 1 tablet (20 mg total) by mouth daily with breakfast.   sulfamethoxazole-trimethoprim (BACTRIM DS) 800-160 MG tablet Take 1 tablet by mouth 2 (two) times daily.   Torsemide 40 MG TABS Take 40 mg by mouth daily.   [DISCONTINUED] furosemide (LASIX) 40 MG tablet Take 1 tablet (40 mg total) by mouth daily.     Allergies:   Patient has no known allergies.   Social History   Socioeconomic History   Marital status: Divorced    Spouse name: Not on file   Number of children: 3   Years of education: Not on file   Highest education level: High school graduate  Occupational History   Occupation: disability  Tobacco Use   Smoking status: Every Day    Packs/day: 1.00    Years: 43.00    Pack years: 43.00    Types: Cigarettes   Smokeless tobacco: Never   Tobacco comments:    4 cigarettes/day x 2 month (04/14/2021)  Vaping Use   Vaping Use: Never used  Substance and Sexual Activity   Alcohol use: No   Drug use: Yes    Frequency: 21.0 times per week    Types: Marijuana, Cocaine    Comment: last use Cocaine- 03/28/2021. Last use marijuana 03/14/2021.   Sexual activity: Not on file  Other Topics Concern   Not on file  Social History Narrative   ** Merged History Encounter **       Social Determinants of Health   Financial Resource Strain: Medium Risk   Difficulty of Paying Living Expenses: Somewhat hard  Food Insecurity: No Food Insecurity   Worried About Charity fundraiser in the Last Year: Never true   Arboriculturist in the Last Year: Never true  Transportation Needs: Unmet Transportation Needs   Lack of Transportation (Medical): Yes   Lack of Transportation (Non-Medical): Yes  Physical Activity: Not on file  Stress: Not on file  Social Connections:  Moderately Isolated   Frequency of Communication with Friends and Family: More than three times a week   Frequency of Social Gatherings with Friends and Family: Once a week   Attends Religious Services: More than 4 times per year   Active Member of [employer] or Organizations: No   Attends Archivist Meetings: Never   Marital Status: Divorced     Family History: The patient's family history includes Diabetes in his mother; HIV in his brother; Healthy in his daughter and son; Heart disease in his father.  ROS:   Please see the history of present illness.  All other systems reviewed and are negative.  EKGs/Labs/Other Studies Reviewed:    The following studies were reviewed today:   EKG:  EKG is  ordered today.  The ekg ordered today demonstrates normal sinus rhythm, occasional PVCs  Recent Labs: 02/11/2021: Magnesium 2.0 04/27/2021: ALT 23; B Natriuretic Peptide 272.0; BUN 20; Creatinine, Ser 1.65; Hemoglobin 15.5; Platelets 150; Potassium 5.0; Sodium 137  Recent Lipid Panel    Component Value Date/Time   CHOL 176 12/25/2019 0632   CHOL 160 11/07/2019 0918   CHOL 157 10/21/2013 0435   TRIG 91 12/25/2019 0632   TRIG 117 10/21/2013 0435   HDL 37 (L) 12/25/2019 0632   HDL 40 11/07/2019 0918   HDL 30 (L) 10/21/2013 0435   CHOLHDL 4.8 12/25/2019 0632   VLDL 18 12/25/2019 0632   VLDL 23 10/21/2013 0435   LDLCALC 121 (H) 12/25/2019 0632   LDLCALC 92 11/07/2019 0918   LDLCALC 104 (H) 10/21/2013 0435     Risk Assessment/Calculations:      Physical Exam:    VS:  BP 110/70 (BP Location: Right Arm, Patient Position: Sitting, Cuff Size: Large)    Pulse 76    Ht 6' (1.829 m)    Wt 275 lb (124.7 kg)    SpO2 95%    BMI 37.30 kg/m     Wt Readings from Last 3 Encounters:  07/10/21 275 lb (124.7 kg)  06/17/21 279 lb (126.6 kg)  05/14/21 279 lb (126.6 kg)     GEN:  Well nourished, well developed in no acute distress HEENT: Normal NECK: No JVD; No carotid  bruits LYMPHATICS: No lymphadenopathy CARDIAC: RRR, no murmurs, rubs, gallops RESPIRATORY: Decreased breath sounds at bases, no wheezing ABDOMEN: Soft, non-tender, distended MUSCULOSKELETAL:  No edema; No deformity  SKIN: Warm and dry NEUROLOGIC:  Alert and oriented x 3 PSYCHIATRIC:  Normal affect   ASSESSMENT:    1. HFrEF (heart failure with reduced ejection fraction) ([place])   2. Atrial flutter, unspecified type ([place])   3. Primary hypertension   4. Smoking     PLAN:    In order of problems listed above:  HFrEF, EF 25%, likely nonischemic secondary to cocaine use.  Describes NYHA class II-III symptoms.  Abdominal distention, orthopnea.  Stop Lasix, start torsemide 40 mg daily.  Check BMP in 7 days.  Continue Coreg 6.25 mg twice daily, continue BiDil, farxiga.  Plan Lexiscan Myoview/ischemic work-up in 2 weeks.  Follow-up in 6 weeks. Atrial flutter, CHA2DS2-VASc 3.  Maintaining sinus rhythm.  Coreg, Eliquis Hypertension, BP controlled.  Coreg, BiDil, torsemide Patient is a current smoker, cessation advised.    F/u in 6 weeks  Shared Decision Making/Informed Consent The risks [chest pain, shortness of breath, cardiac arrhythmias, dizziness, blood pressure fluctuations, myocardial infarction, stroke/transient ischemic attack, nausea, vomiting, allergic reaction, radiation exposure, metallic taste sensation and life-threatening complications (estimated to be 1 in 10,000)], benefits (risk stratification, diagnosing coronary artery disease, treatment guidance) and alternatives of a nuclear stress test were discussed in detail with Chris [name] and he agrees to proceed.    Medication Adjustments/Labs and Tests Ordered: Current medicines are reviewed at length with the patient today.  Concerns regarding medicines are outlined above.  Orders Placed This Encounter  Procedures   NM Myocar Multi W/Spect W/Wall Motion / EF   Basic metabolic panel   EKG [id number]   Meds ordered this encounter   Medications   Torsemide 40 MG TABS    Sig: Take 40 mg by mouth daily.  Dispense:  30 tablet    Refill:  3    Patient Instructions  Medication Instructions:   Your physician has recommended you make the following change in your medication:   STOP taking Furosemide (Lasix).  2.   START Torsemide 40 MG once a day.  *If you need a refill on your cardiac medications before your next appointment, please call your pharmacy*   Lab Work:  Your physician recommends that you return for lab work (BMP) in: 1 week    Please return to our office on_____________________at______________am/pm    Testing/Procedures:  [place] [address] [place] caregiver has ordered a Stress Test with nuclear imaging. The purpose of this test is to evaluate the blood supply to your heart muscle. This procedure is referred to as a "Non-Invasive Stress Test." This is because other than having an IV started in your vein, nothing is inserted or "invades" your body. Cardiac stress tests are done to find areas of poor blood flow to the heart by determining the extent of coronary artery disease (CAD). Some patients exercise on a treadmill, which naturally increases the blood flow to your heart, while others who are  unable to walk on a treadmill due to physical limitations have a pharmacologic/chemical stress agent called Lexiscan . This medicine will mimic walking on a treadmill by temporarily increasing your coronary blood flow.      PLEASE REPORT TO [facility] MEDICAL MALL ENTRANCE   THE VOLUNTEERS AT THE FIRST DESK WILL DIRECT YOU WHERE TO GO     *Please note: these test may take anywhere between 2-4 hours to complete       Date of Procedure:_____________________________________ (2 weeks from office visit)  Arrival Time for Procedure:______________________________    PLEASE NOTIFY THE OFFICE AT LEAST 24 HOURS IN ADVANCE IF YOU ARE UNABLE TO [place].  [place] 24 HOURS IN ADVANCE IF YOU ARE UNABLE TO KEEP YOUR APPOINTMENT. [phone]       .ctcc  How to prepare for your Myoview test:         _XX___:  Hold diabetes medication the morning of procedure: glipiZIDE (GLUCOTROL) 5 MG tablet   _XX___:  Hold other medications as follows: Torsemide    1. Do not eat or drink after midnight  2. No caffeine for 24 hours prior to test  3. No smoking 24 hours prior to test.  4. Unless instructed otherwise, Take your medication with a small sips of water.    5.         Ladies, please do not wear dresses. Skirts or pants are appropriate. Please wear a short sleeve shirt.  6. No perfume, cologne or lotion.  7. Wear comfortable walking shoes. No heels!     Follow-Up: At [facility], you and your health needs are our priority.  As part of our continuing mission to provide you with exceptional heart care, we have created designated Provider Care Teams.  These Care Teams include your primary Cardiologist (physician) and Advanced Practice Providers (APPs -  Physician Assistants and Nurse Practitioners) who all work together to provide you with the care you need, when you need it.  We recommend signing up for the patient portal called "MyChart".  Sign up information is provided on this After Visit Summary.  MyChart is used to connect with patients for Virtual Visits (Telemedicine).  Patients are able to view lab/test results, encounter notes,  upcoming appointments, etc.  Non-urgent messages can be sent to your provider as well.   To learn more about what you can do with MyChart, go to [email or web address].    Your next appointment:   Follow up after testing   The format for your next appointment:   In Person  Provider:    [name]     Signed, [name], MD  07/10/2021 10:54 AM    [place]

## 2021-07-13 ENCOUNTER — Other Ambulatory Visit: Payer: Self-pay

## 2021-07-16 ENCOUNTER — Other Ambulatory Visit: Payer: Self-pay | Admitting: *Deleted

## 2021-07-16 ENCOUNTER — Other Ambulatory Visit: Payer: Self-pay

## 2021-07-16 NOTE — Patient Instructions (Signed)
Visit Information  Mr. Bisceglia was given information about Medicaid Managed Care team care coordination services as a part of their Healthy [facility] Medicaid benefit. JOHNMARK GEIGER verbally consented to engagement with the [facility] Managed Care team.   If you are experiencing a medical emergency, please call 911 or report to your local emergency department or urgent care.   If you have a non-emergency medical problem during routine business hours, please contact your provider's office and ask to speak with a nurse.   For questions related to your Healthy [facility] health plan, please call: [phone] or visit the homepage here: [email or web address]  If you would like to schedule transportation through your Healthy [facility] plan, please call the following number at least 2 days in advance of your appointment: [phone]  Call the [place] at [phone], at any time, 24 hours a day, 7 days a week. If you are in danger or need immediate medical attention call 911.  If you would like help to quit smoking, call 1-800-QUIT-NOW (787) 297-6927) OR Espaol: 1-855-Djelo-Ya (9-323-557-3220) o para ms informacin haga clic aqu or Text READY to 200-400 to register via text  Mr. Pavich,   Please see education materials related to CHF and COPD provided as print materials.   The patient verbalized understanding of instructions provided today and agreed to receive a mailed copy of patient instruction and/or educational materials.  Telephone follow up appointment with Managed Medicaid care management team member scheduled for:08/12/21 @ 1:15pm  [name] RN, BSN [place] RN Care Coordinator   Following is a copy of your plan of care:  Care Plan : RN Care Manager Plan of Care  Updates made by [name], RN since 07/16/2021 12:00 AM     Problem: Knowledge Deficits and Care Coordination Needs  related to long term management of CHF   Priority: High     Long-Range Goal: Development of Plan of Care to address Care Coordination Needs and Knowledge Deficits related to CHF   Start Date: 04/28/2021  Expected End Date: 07/27/2021  Priority: High  Note:   Current Barriers:  Knowledge Deficits related to plan of care for management of CHF  Care Coordination needs related to Transportation  Non-adherence to prescribed medication regimen Difficulty obtaining medications Mr. Crickenberger attended Cardiology appointment and has a Nuclear Stress Test scheduled on 07/28/21. He is aware and has transportation. He has all of his medications and is taking them. He has not been contacted for home repairs and would like an update. He did not receive provided education from last phone visit.  RNCM Clinical Goal(s):  Patient will verbalize understanding of plan for management of CHF as evidenced by patient verbalization of plan take all medications exactly as prescribed and will call provider for medication related questions as evidenced by physician notes in EMR    attend all scheduled medical appointments: follow up on referrals placed by PCP as evidenced by physician notes in EMR        demonstrate improved adherence to prescribed treatment plan for CHF as evidenced by attending Cardiology 07/10/21 at 9:25am work with pharmacist to address Medication procurement related to CHF as evidenced by review of EMR and patient or pharmacist report    through collaboration with RN Care manager, provider, and care team.   Interventions: Inter-disciplinary care team collaboration (see longitudinal plan of care) Evaluation of current treatment plan related to  self management and patient's adherence to plan as  established by provider Collaborated with Care Guide for update on call from Charm Rings with [place] will resend information from last  visit   COPD Interventions:  (Status:  Condition stable.  Not addressed this visit.) Long Term Goal Provided patient with basic written and verbal COPD education on self care/management/and exacerbation prevention Advised patient to self assesses COPD action plan zone and make appointment with provider if in the yellow zone for 48 hours without improvement Provided education about and advised patient to utilize infection prevention strategies to reduce risk of respiratory infection Discussed the importance of adequate rest and management of fatigue with COPD Assessed social determinant of health barriers   Heart Failure Interventions:  (Status: Goal on Track (progressing): YES.)  Long Term Goal  Reviewed Heart Failure Action Plan in depth and provided written copy Discussed the importance of keeping all appointments with provider Assessed social determinant of health barriers Discussed the importance of following up with Cardiology appointment on 07/10/21-details provided. Patient will arrange transportation Advised patient to take all medications to Cardiology appointment  Patient Goals/Self-Care Activities: Patient will self administer medications as prescribed as evidenced by self report/primary caregiver report  Patient will attend all scheduled provider appointments as evidenced by clinician review of documented attendance to scheduled appointments and patient/caregiver report Patient will attend church or other social activities as evidenced by patient report Patient will continue to perform ADL's independently as evidenced by patient/caregiver report Patient will continue to perform IADL's independently as evidenced by patient/caregiver report Patient will call provider office for new concerns or questions as evidenced by review of documented incoming telephone call notes and patient report Patient will work with MM Pharmacist for medication management as evidenced by notes in  EMR call office if I gain more than 2 pounds in one day or 5 pounds in one week use salt in moderation watch for swelling in feet, ankles and legs every day weigh myself daily know when to call the doctor

## 2021-07-16 NOTE — Patient Outreach (Signed)
Medicaid Managed Care   Nurse Care Manager Note  07/16/2021 Name:  Chris [name] MRN:  [id number] DOB:  April 28, 1961  Chris [name] is an 62 y.o. year old male who is [name] primary patient of Chris Matar, MD.  The [facility] Managed Care Coordination team was consulted for assistance with:    CHF COPD  Chris [name] was given information about Medicaid Managed Care Coordination team services today. Chris [name] Patient agreed to services and verbal consent obtained.  Engaged with patient by telephone for follow up visit in response to provider referral for case management and/or care coordination services.   Assessments/Interventions:  Review of past medical history, allergies, medications, health status, including review of consultants reports, laboratory and other test data, was performed as part of comprehensive evaluation and provision of chronic care management services.  SDOH (Social Determinants of Health) assessments and interventions performed: SDOH Interventions    Flowsheet Row Most Recent Value  SDOH Interventions   Transportation Interventions Intervention Not Indicated       Care Plan  No Known Allergies  Medications Reviewed Today     Reviewed by Chris Dach, RN (Registered Nurse) on 07/16/21 at 1301  Med List Status: <None>   Medication Order Taking? Sig Documenting Provider Last Dose Status Informant  Accu-Chek Softclix Lancets lancets [id number]  Use as instructed Chris Matar, MD  Active Self  albuterol (PROVENTIL) (2.5 MG/3ML) 0.083% nebulizer solution [id number] Yes TAKE 3 MLS BY NEBULIZATION EVERY 6 (SIX) HOURS AS NEEDED FOR SHORTNESS OF BREATH. Chris Matar, MD Taking Active Self           Med Note Chris [name] Apr 27, 2021  1:29 PM)    albuterol (VENTOLIN HFA) 108 [phone] Base) MCG/ACT inhaler [id number] Yes Inhale 2 puffs into the lungs every 6 (six) hours as needed for wheezing or shortness of breath. Chris Matar, MD Taking  Active   allopurinol (ZYLOPRIM) 100 MG tablet [id number] Yes TAKE 2 TABLETS (200 MG TOTAL) BY MOUTH DAILY. Chris Matar, MD Taking Active Self           Med Note Chris [name] Apr 27, 2021  1:29 PM)    apixaban (ELIQUIS) 5 MG TABS tablet [id number] Yes TAKE 1 TABLET (5 MG TOTAL) BY MOUTH 2 (TWO) TIMES DAILY. Chris Matar, MD Taking Active Self           Med Note Chris [name] Apr 27, 2021  1:26 PM)    aspirin 81 MG EC tablet [id number] Yes Take 1 tablet (81 mg total) by mouth daily. Chris Matar, MD Taking Active Self           Med Note Chris [name] Apr 27, 2021  1:27 PM)    atorvastatin (LIPITOR) 40 MG tablet [id number] Yes Take 1 tablet (40 mg total) by mouth daily. Chris Matar, MD Taking Active Self           Med Note Chris [name] Apr 27, 2021  1:29 PM)    Blood Glucose Monitoring Suppl (ACCU-CHEK GUIDE) w/Device Chris [name] [id number]  Use as directed Chris Matar, MD  Active Self  budesonide-formoterol [facility]) 160-4.5 MCG/ACT inhaler [id number] Yes Inhale 2 puffs into the lungs 2 (two) times daily. Chris Matar, MD Taking Active Self           Med Note Chris [name], [place] [facility]  Mon Apr 27, 2021  1:29 PM)    carvedilol (COREG) 6.25 MG tablet [id number] Yes Take 1 tablet (6.25 mg total) by mouth 2 (two) times daily. Chris Freeze, FNP Taking Active Self           Med Note Chris [name] Apr 27, 2021  1:29 PM)    colchicine 0.6 MG tablet [id number] Yes Take 2 tabs (1.2 mg) at the onset of [name] gout flare, may repeat 1 tab (0.6 mg) after 2 hours if symptoms persist. Chris Matar, MD Taking Active   dapagliflozin propanediol (FARXIGA) 10 MG TABS tablet [id number] Yes Take 1 tablet (10 mg total) by mouth daily. Chris Freeze, FNP Taking Active Self           Med Note Chris [name] Apr 27, 2021  1:29 PM)    glipiZIDE (GLUCOTROL) 5 MG tablet [id number] Yes Take 0.5 tablets (2.5 mg total) by mouth 2 (two) times daily  before [name] meal. Chris Matar, MD Taking Active Self           Med Note Chris [name] Apr 27, 2021  1:29 PM)    glucose blood (ACCU-CHEK GUIDE) test strip [id number]  Use as directed to check blood sugar 1-2 times [name] day Chris Matar, MD  Active Self  isosorbide-hydrALAZINE (BIDIL) 20-37.5 MG tablet [id number] Yes TAKE 1 TABLET BY MOUTH 3 (THREE) TIMES DAILY. Chris Shipper, MD Taking Active     Discontinued 07/11/20 [phone] (Discontinued by provider)   montelukast (SINGULAIR) 10 MG tablet [id number] Yes TAKE 1 TABLET (10 MG TOTAL) BY MOUTH AT BEDTIME. Chris Matar, MD Taking Active   predniSONE (DELTASONE) 20 MG tablet [id number] Yes Take 1 tablet (20 mg total) by mouth daily with breakfast. Chris Register, MD Taking Active   sulfamethoxazole-trimethoprim (BACTRIM DS) 800-160 MG tablet [id number] No Take 1 tablet by mouth 2 (two) times daily.  Patient not taking: Reported on 07/16/2021   Chris Matar, MD Not Taking Active            Med Note Chris [name], Chris [name]   Thu Jul 16, 2021  1:01 PM) completed  torsemide (DEMADEX) 20 MG tablet [id number] Yes Take 2 tablets (40 mg total) by mouth daily. Chris Odea, MD Taking Active             Patient Active Problem List   Diagnosis Date Noted   Right kidney mass 05/14/2021   CHF exacerbation (HCC) 04/14/2021   Chest pain 04/14/2021   Syncope 04/14/2021   Left-sided weakness 10/28/2020   COPD exacerbation (HCC)    Typical atrial flutter (HCC)    Acute respiratory failure with hypoxia (HCC) 07/05/2020   CHF (congestive heart failure) (HCC) 07/04/2020   Acute exacerbation of CHF (congestive heart failure) (HCC) 06/16/2020   COPD with acute exacerbation (HCC) 06/16/2020   Influenza vaccine refused 05/06/2020   Acute on chronic combined systolic (congestive) and diastolic (congestive) heart failure (HCC) 05/05/2020   Acute decompensated heart failure (HCC) 05/04/2020   Illiteracy 05/04/2020   Chronic respiratory  failure with hypoxia, on home oxygen therapy (HCC) 12/28/2019   Type 2 diabetes mellitus with stage 3 chronic kidney disease (HCC) 12/25/2019   Acute and chronic respiratory failure (acute-on-chronic) (HCC) 12/25/2019   Acute on chronic combined systolic and diastolic CHF (congestive heart failure) (HCC) 10/26/2019   Elevated troponin I level 10/26/2019   Acute on chronic diastolic (congestive) heart failure (HCC)  10/26/2019   History of gout 02/01/2019   Seasonal allergic rhinitis due to pollen 02/01/2019   Tobacco dependence 11/30/2018   Microscopic hematuria 11/30/2018   Depression 11/30/2018   Difficulty controlling anger 11/30/2018   CAP (community acquired pneumonia) 08/11/2018   COPD (chronic obstructive pulmonary disease) (HCC)    CKD (chronic kidney disease) stage 3, GFR 30-59 ml/min (HCC) 08/10/2018   Recurrent epistaxis 04/21/2018   Mixed hyperlipidemia 07/28/2017   Essential hypertension 07/28/2017   Chronic systolic heart failure (HCC) 10/25/2014   Cocaine abuse (HCC) 02/20/2013   Cannabis abuse 02/20/2013   Back pain, chronic 02/20/2013    Conditions to be addressed/monitored per PCP order:  CHF and COPD  Care Plan : RN Care Manager Plan of Care  Updates made by Chris Dach, RN since 07/16/2021 12:00 AM     Problem: Knowledge Deficits and Care Coordination Needs related to long term management of CHF   Priority: High     Long-Range Goal: Development of Plan of Care to address Care Coordination Needs and Knowledge Deficits related to CHF   Start Date: 04/28/2021  Expected End Date: 07/27/2021  Priority: High  Note:   Current Barriers:  Knowledge Deficits related to plan of care for management of CHF  Care Coordination needs related to Transportation  Non-adherence to prescribed medication regimen Difficulty obtaining medications Chris [name] attended Cardiology appointment and has [name] Nuclear Stress Test scheduled on 07/28/21. He is aware and has transportation.  He has all of his medications and is taking them. He has not been contacted for home repairs and would like an update. He did not receive provided education from last phone visit.  RNCM Clinical Goal(s):  Patient will verbalize understanding of plan for management of CHF as evidenced by patient verbalization of plan take all medications exactly as prescribed and will call provider for medication related questions as evidenced by physician notes in EMR    attend all scheduled medical appointments: follow up on referrals placed by PCP as evidenced by physician notes in EMR        demonstrate improved adherence to prescribed treatment plan for CHF as evidenced by attending Cardiology 07/10/21 at 9:25am work with pharmacist to address Medication procurement related to CHF as evidenced by review of EMR and patient or pharmacist report    through collaboration with RN Care manager, provider, and care team.   Interventions: Inter-disciplinary care team collaboration (see longitudinal plan of care) Evaluation of current treatment plan related to  self management and patient's adherence to plan as established by provider Collaborated with Care Guide for update on call from Trixie Dredge with [facility] [facility] RNCM will resend information from last visit   COPD Interventions:  (Status:  Condition stable.  Not addressed this visit.) Long Term Goal Provided patient with basic written and verbal COPD education on self care/management/and exacerbation prevention Advised patient to self assesses COPD action plan zone and make appointment with provider if in the yellow zone for 48 hours without improvement Provided education about and advised patient to utilize infection prevention strategies to reduce risk of respiratory infection Discussed the importance of adequate rest and management of fatigue with COPD Assessed social determinant of health barriers   Heart  Failure Interventions:  (Status: Goal on Track (progressing): YES.)  Long Term Goal  Reviewed Heart Failure Action Plan in depth and provided written copy Discussed the importance of keeping all appointments with provider Assessed social determinant of health barriers Discussed  the importance of following up with Cardiology appointment on 07/10/21-details provided. Patient will arrange transportation Advised patient to take all medications to Cardiology appointment  Patient Goals/Self-Care Activities: Patient will self administer medications as prescribed as evidenced by self report/primary caregiver report  Patient will attend all scheduled provider appointments as evidenced by clinician review of documented attendance to scheduled appointments and patient/caregiver report Patient will attend church or other social activities as evidenced by patient report Patient will continue to perform ADL's independently as evidenced by patient/caregiver report Patient will continue to perform IADL's independently as evidenced by patient/caregiver report Patient will call provider office for new concerns or questions as evidenced by review of documented incoming telephone call notes and patient report Patient will work with [facility] Pharmacist for medication management as evidenced by notes in EMR call office if I gain more than 2 pounds in one day or 5 pounds in one week use salt in moderation watch for swelling in feet, ankles and legs every day weigh myself daily know when to call the doctor        Follow Up:  Patient agrees to Care Plan and Follow-up.  Plan: The Managed Medicaid care management team will reach out to the patient again over the next 30 days.  Date/time of next scheduled RN care management/care coordination outreach:  08/12/21 @ 1:15pm  Estanislado Emms RN, BSN [place]   [employer]

## 2021-07-20 ENCOUNTER — Other Ambulatory Visit (INDEPENDENT_AMBULATORY_CARE_PROVIDER_SITE_OTHER): Payer: Medicaid Other

## 2021-07-20 ENCOUNTER — Other Ambulatory Visit: Payer: Self-pay

## 2021-07-20 DIAGNOSIS — I5020 Unspecified systolic (congestive) heart failure: Secondary | ICD-10-CM

## 2021-07-21 ENCOUNTER — Telehealth: Payer: Self-pay

## 2021-07-21 DIAGNOSIS — I5033 Acute on chronic diastolic (congestive) heart failure: Secondary | ICD-10-CM

## 2021-07-21 LAB — BASIC METABOLIC PANEL
BUN/Creatinine Ratio: 16 (ref 9–20)
BUN: 32 mg/dL — ABNORMAL HIGH (ref 6–24)
CO2: 25 mmol/L (ref 20–29)
Calcium: 9.9 mg/dL (ref 8.7–10.2)
Chloride: 97 mmol/L (ref 96–106)
Creatinine, Ser: 2.02 mg/dL — ABNORMAL HIGH (ref 0.76–1.27)
Glucose: 89 mg/dL (ref 70–99)
Potassium: 4.3 mmol/L (ref 3.5–5.2)
Sodium: 137 mmol/L (ref 134–144)
eGFR: 38 mL/min/{1.73_m2} — ABNORMAL LOW (ref >59)

## 2021-07-21 NOTE — Telephone Encounter (Signed)
Called patient at both listed phone numbers and was unable to leave a message on either one.   Order has been entered for the repeat BMP to be scheduled in 2 weeks around 08/04/21.  Will try and reach again.

## 2021-07-21 NOTE — Telephone Encounter (Signed)
-----   Message from [name], MD sent at 07/21/2021 10:42 AM EST ----- Creatinine appears stable compared to results over the past 5 months.  Continue medications as prescribed.  Repeat BMP in 2 weeks.

## 2021-07-28 ENCOUNTER — Other Ambulatory Visit: Payer: Self-pay

## 2021-07-28 ENCOUNTER — Encounter
Admission: RE | Admit: 2021-07-28 | Discharge: 2021-07-28 | Disposition: A | Discharge status: Home / Self Care | Payer: Medicaid Other | Source: Ambulatory Visit | Attending: Cardiology | Admitting: Cardiology

## 2021-07-28 DIAGNOSIS — I4892 Unspecified atrial flutter: Secondary | ICD-10-CM | POA: Insufficient documentation

## 2021-07-28 DIAGNOSIS — I5020 Unspecified systolic (congestive) heart failure: Secondary | ICD-10-CM | POA: Diagnosis not present

## 2021-07-28 LAB — NM MYOCAR MULTI W/SPECT W/WALL MOTION / EF
LV dias vol: 261 mL (ref 62–150)
LV sys vol: 185 mL
MPHR: 162 {beats}/min
Nuc Stress EF: 29 %
Peak HR: 97 {beats}/min
Percent HR: 59 %
Rest HR: 67 {beats}/min
Rest Nuclear Isotope Dose: 10.4 mCi
SDS: 4
SRS: 3
SSS: 4
ST Depression (mm): 0 mm
Stress Nuclear Isotope Dose: 30.8 mCi
TID: 1.06

## 2021-07-28 MED ORDER — TECHNETIUM TC 99M TETROFOSMIN IV KIT
10.3500 | PACK | Freq: Once | INTRAVENOUS | Status: AC | PRN
  Administered 2021-07-28: 10.35 via INTRAVENOUS

## 2021-07-28 MED ORDER — REGADENOSON 0.4 MG/5ML IV SOLN
0.4000 mg | Freq: Once | INTRAVENOUS | Status: AC
  Administered 2021-07-28: 0.4 mg via INTRAVENOUS

## 2021-07-28 MED ORDER — TECHNETIUM TC 99M TETROFOSMIN IV KIT
30.7700 | PACK | Freq: Once | INTRAVENOUS | Status: AC | PRN
  Administered 2021-07-28: 30.77 via INTRAVENOUS

## 2021-07-29 NOTE — Telephone Encounter (Signed)
Called patient at both listed numbers and was unable to leave a VM at either one. Will mail a letter to patient with result notes, and a request to call our office to schedule his BMP asap.

## 2021-08-04 ENCOUNTER — Telehealth: Payer: Self-pay

## 2021-08-04 NOTE — Telephone Encounter (Signed)
Have been unable to reach patient after several attempts on 07/21/21 for lab results, and again for Myoview results. Patient did not get his 6 week follow up scheduled at the time of his checkout when [facility] and labs were scheduled. Patients needs to follow up with Dr. Garen Lah ONLY in the next 2 weeks. Sent the following MyChart message to patients MyChart also requesting a call back.  Hi Chris [name],  I have tried to reach you by phone, however your VM is full. Below is your result note from Dr. Garen Lah regarding your recent Stress test:  Your Stress test showed fixed inferior defect, no significant ischemia, this could be related to artifacts.  Continue medications as prescribed.  Squeezing function of the heart is reduced as previously noted on echo.  Keep follow-up appointment  Dr. Garen Lah can review with you at your follow up appointment which you need to call us and schedule. Please call [phone]. We do not have that appointment scheduled and we would like to see you in 2 weeks.  Sincerely,  Mate Alegria RN

## 2021-08-11 NOTE — Telephone Encounter (Signed)
Attempted to schedule unable to LVM is full

## 2021-08-12 ENCOUNTER — Other Ambulatory Visit: Payer: Self-pay

## 2021-08-12 ENCOUNTER — Other Ambulatory Visit: Payer: Self-pay | Admitting: *Deleted

## 2021-08-12 NOTE — Patient Outreach (Signed)
Medicaid Managed Care   Nurse Care Manager Note  08/12/2021 Name:  Chris [name] MRN:  [id number] DOB:  07-31-1960  Chris [name] is an 61 y.o. year old male who is [name] primary patient of Chris Matar, Chris [name].  The [facility] Managed Care Coordination team was consulted for assistance with:    CHF COPD  Chris [name] was given information about Medicaid Managed Care Coordination team services today. Chris [name] Patient agreed to services and verbal consent obtained.  Engaged with patient by telephone for follow up visit in response to provider referral for case management and/or care coordination services.   Assessments/Interventions:  Review of past medical history, allergies, medications, health status, including review of consultants reports, laboratory and other test data, was performed as part of comprehensive evaluation and provision of chronic care management services.  SDOH (Social Determinants of Health) assessments and interventions performed: SDOH Interventions    Flowsheet Row Most Recent Value  SDOH Interventions   Housing Interventions Intervention Not Indicated       Care Plan  No Known Allergies  Medications Reviewed Today     Reviewed by Chris Dach, RN (Registered Nurse) on 07/16/21 at 1301  Med List Status: <None>   Medication Order Taking? Sig Documenting Provider Last Dose Status Informant  Accu-Chek Softclix Lancets lancets [id number]  Use as instructed Chris Matar, Chris [name]  Active Self  albuterol (PROVENTIL) (2.5 MG/3ML) 0.083% nebulizer solution [id number] Yes TAKE 3 MLS BY NEBULIZATION EVERY 6 (SIX) HOURS AS NEEDED FOR SHORTNESS OF BREATH. Chris Matar, Chris [name] Taking Active Self           Med Note Chris [name] Apr 27, 2021  1:29 PM)    albuterol (VENTOLIN HFA) 108 [phone] Base) MCG/ACT inhaler [id number] Yes Inhale 2 puffs into the lungs every 6 (six) hours as needed for wheezing or shortness of breath. Chris Matar, Chris [name] Taking Active    allopurinol (ZYLOPRIM) 100 MG tablet [id number] Yes TAKE 2 TABLETS (200 MG TOTAL) BY MOUTH DAILY. Chris Matar, Chris [name] Taking Active Self           Med Note Chris [name] Apr 27, 2021  1:29 PM)    apixaban (ELIQUIS) 5 MG TABS tablet [id number] Yes TAKE 1 TABLET (5 MG TOTAL) BY MOUTH 2 (TWO) TIMES DAILY. Chris Matar, Chris [name] Taking Active Self           Med Note Chris [name] Apr 27, 2021  1:26 PM)    aspirin 81 MG EC tablet [id number] Yes Take 1 tablet (81 mg total) by mouth daily. Chris Matar, Chris [name] Taking Active Self           Med Note Chris [name] Apr 27, 2021  1:27 PM)    atorvastatin (LIPITOR) 40 MG tablet [id number] Yes Take 1 tablet (40 mg total) by mouth daily. Chris Matar, Chris [name] Taking Active Self           Med Note Chris [name] Apr 27, 2021  1:29 PM)    Blood Glucose Monitoring Suppl (ACCU-CHEK GUIDE) w/Device Chris [name] [id number]  Use as directed Chris Matar, Chris [name]  Active Self  budesonide-formoterol [facility]) 160-4.5 MCG/ACT inhaler [id number] Yes Inhale 2 puffs into the lungs 2 (two) times daily. Chris Matar, Chris [name] Taking Active Self           Med Note Chris [name], [place] [facility]  Mon Apr 27, 2021  1:29 PM)    carvedilol (COREG) 6.25 MG tablet [id number] Yes Take 1 tablet (6.25 mg total) by mouth 2 (two) times daily. Chris Freeze, Chris [name] Taking Active Self           Med Note Chris [name] Apr 27, 2021  1:29 PM)    colchicine 0.6 MG tablet [id number] Yes Take 2 tabs (1.2 mg) at the onset of [name] gout flare, may repeat 1 tab (0.6 mg) after 2 hours if symptoms persist. Chris Matar, Chris [name] Taking Active   dapagliflozin propanediol (FARXIGA) 10 MG TABS tablet [id number] Yes Take 1 tablet (10 mg total) by mouth daily. Chris Freeze, Chris [name] Taking Active Self           Med Note Chris [name] Apr 27, 2021  1:29 PM)    glipiZIDE (GLUCOTROL) 5 MG tablet [id number] Yes Take 0.5 tablets (2.5 mg total) by mouth 2 (two) times daily before [name]  meal. Chris Matar, Chris [name] Taking Active Self           Med Note Chris [name] Apr 27, 2021  1:29 PM)    glucose blood (ACCU-CHEK GUIDE) test strip [id number]  Use as directed to check blood sugar 1-2 times [name] day Chris Matar, Chris [name]  Active Self  isosorbide-hydrALAZINE (BIDIL) 20-37.5 MG tablet [id number] Yes TAKE 1 TABLET BY MOUTH 3 (THREE) TIMES DAILY. Chris Shipper, Chris [name] Taking Active     Discontinued 07/11/20 [phone] (Discontinued by provider)   montelukast (SINGULAIR) 10 MG tablet [id number] Yes TAKE 1 TABLET (10 MG TOTAL) BY MOUTH AT BEDTIME. Chris Matar, Chris [name] Taking Active   predniSONE (DELTASONE) 20 MG tablet [id number] Yes Take 1 tablet (20 mg total) by mouth daily with breakfast. Chris Register, Chris [name] Taking Active   sulfamethoxazole-trimethoprim (BACTRIM DS) 800-160 MG tablet [id number] No Take 1 tablet by mouth 2 (two) times daily.  Patient not taking: Reported on 07/16/2021   Chris Matar, Chris [name] Not Taking Active            Med Note Chris [name], Chris [name]   Thu Jul 16, 2021  1:01 PM) completed  torsemide (DEMADEX) 20 MG tablet [id number] Yes Take 2 tablets (40 mg total) by mouth daily. Chris Odea, Chris [name] Taking Active             Patient Active Problem List   Diagnosis Date Noted   Right kidney mass 05/14/2021   CHF exacerbation (HCC) 04/14/2021   Chest pain 04/14/2021   Syncope 04/14/2021   Left-sided weakness 10/28/2020   COPD exacerbation (HCC)    Typical atrial flutter (HCC)    Acute respiratory failure with hypoxia (HCC) 07/05/2020   CHF (congestive heart failure) (HCC) 07/04/2020   Acute exacerbation of CHF (congestive heart failure) (HCC) 06/16/2020   COPD with acute exacerbation (HCC) 06/16/2020   Influenza vaccine refused 05/06/2020   Acute on chronic combined systolic (congestive) and diastolic (congestive) heart failure (HCC) 05/05/2020   Acute decompensated heart failure (HCC) 05/04/2020   Illiteracy 05/04/2020   Chronic respiratory failure with  hypoxia, on home oxygen therapy (HCC) 12/28/2019   Type 2 diabetes mellitus with stage 3 chronic kidney disease (HCC) 12/25/2019   Acute and chronic respiratory failure (acute-on-chronic) (HCC) 12/25/2019   Acute on chronic combined systolic and diastolic CHF (congestive heart failure) (HCC) 10/26/2019   Elevated troponin I level 10/26/2019   Acute on chronic diastolic (congestive) heart failure (HCC)  10/26/2019   History of gout 02/01/2019   Seasonal allergic rhinitis due to pollen 02/01/2019   Tobacco dependence 11/30/2018   Microscopic hematuria 11/30/2018   Depression 11/30/2018   Difficulty controlling anger 11/30/2018   CAP (community acquired pneumonia) 08/11/2018   COPD (chronic obstructive pulmonary disease) (HCC)    CKD (chronic kidney disease) stage 3, GFR 30-59 ml/min (HCC) 08/10/2018   Recurrent epistaxis 04/21/2018   Mixed hyperlipidemia 07/28/2017   Essential hypertension 07/28/2017   Chronic systolic heart failure (HCC) 10/25/2014   Cocaine abuse (HCC) 02/20/2013   Cannabis abuse 02/20/2013   Back pain, chronic 02/20/2013    Conditions to be addressed/monitored per PCP order:  CHF and COPD  Care Plan : RN Care Manager Plan of Care  Updates made by Chris Dach, RN since 08/12/2021 12:00 AM     Problem: Knowledge Deficits and Care Coordination Needs related to long term management of CHF   Priority: High     Long-Range Goal: Development of Plan of Care to address Care Coordination Needs and Knowledge Deficits related to CHF   Start Date: 04/28/2021  Expected End Date: 09/11/2021  Priority: High  Note:   Current Barriers:  Knowledge Deficits related to plan of care for management of CHF  Care Coordination needs related to Transportation  Non-adherence to prescribed medication regimen Difficulty obtaining medications Mr. Wion attended Nuclear Stress Test, he missed calls from the office for result review and attempt to schedule follow up. RNCM also noted in  EMR [facility] attempting to contact patient to schedule referral placed by Dr. Laural Benes. Mr. Jolly was glad to receive the contacts and plans to call and schedule today. He reports taking all of his medications. He continues to wait for assistance to repair water leak.  RNCM Clinical Goal(s):  Patient will verbalize understanding of plan for management of CHF as evidenced by patient verbalization of plan take all medications exactly as prescribed and will call provider for medication related questions as evidenced by physician notes in EMR    attend all scheduled medical appointments: follow up on referrals placed by PCP as evidenced by physician notes in EMR        demonstrate improved adherence to prescribed treatment plan for CHF as evidenced by attending Cardiology 07/10/21 at 9:25am work with pharmacist to address Medication procurement related to CHF as evidenced by review of EMR and patient or pharmacist report    through collaboration with RN Care manager, provider, and care team.   Interventions: Inter-disciplinary care team collaboration (see longitudinal plan of care) Evaluation of current treatment plan related to  self management and patient's adherence to plan as established by provider Provided patient with [facility] [phone] to schedule appointment-Referral placed by Dr. Angelina Sheriff patient to schedule follow up with Dr. Laural Benes Advised patient to take all medications with him to all appointments   COPD Interventions:  (Status:  Condition stable.  Not addressed this visit.) Long Term Goal Provided patient with basic written and verbal COPD education on self care/management/and exacerbation prevention Advised patient to self assesses COPD action plan zone and make appointment with provider if in the yellow zone for 48 hours without improvement Provided education about and advised patient to utilize infection prevention strategies to reduce risk of respiratory  infection Discussed the importance of adequate rest and management of fatigue with COPD Assessed social determinant of health barriers   Heart Failure Interventions:  (Status: Goal on Track (progressing): YES.)  Long Term Goal  Discussed the importance of keeping all appointments with provider Provided patient with education about the role of exercise in the management of heart failure Assessed social determinant of health barriers Provided patient with Dr. [name] [phone], office has been trying to reach patient to schedule follow up Advised patient to take all medications to Cardiology appointment  Patient Goals/Self-Care Activities: Patient will self administer medications as prescribed as evidenced by self report/primary caregiver report  Patient will attend all scheduled provider appointments as evidenced by clinician review of documented attendance to scheduled appointments and patient/caregiver report Patient will attend church or other social activities as evidenced by patient report Patient will continue to perform ADL's independently as evidenced by patient/caregiver report Patient will continue to perform IADL's independently as evidenced by patient/caregiver report Patient will call provider office for new concerns or questions as evidenced by review of documented incoming telephone call notes and patient report Patient will work with MM Pharmacist for medication management as evidenced by notes in EMR call office if I gain more than 2 pounds in one day or 5 pounds in one week use salt in moderation watch for swelling in feet, ankles and legs every day weigh myself daily know when to call the doctor        Follow Up:  Patient agrees to Care Plan and Follow-up.  Plan: The Managed Medicaid care management team will reach out to the patient again over the next 30 days.  Date/time of next scheduled RN care management/care coordination outreach:  09/11/21 @  1:15pm  Estanislado Emms RN, BSN [place]   [employer]

## 2021-08-12 NOTE — Patient Instructions (Signed)
Visit Information ? ?Chris [name] was given information about Medicaid Managed [name] team [name] coordination services as a part of their [employer] Medicaid benefit. Chris [name] verbally consented to engagement with the [facility] Managed [name] team.  ? ?If you are experiencing a medical emergency, please call 911 or report to your local emergency department or urgent [name].  ? ?If you have a non-emergency medical problem during routine business hours, please contact your provider's office and ask to speak with a nurse.  ? ?For questions related to your Healthy [facility] health plan, please call: [phone] or visit the homepage here: [email or web address] ? ?If you would like to schedule transportation through your Healthy [facility] plan, please call the following number at least 2 days in advance of your appointment: [phone] ? For information about your ride after you set it up, call Ride Assist at [phone]. Use this number to activate a Will Call pickup, or if your transportation is late for a scheduled pickup. Use this number, too, if you need to make a change or cancel a previously scheduled reservation. ? If you need transportation services right away, call [phone]. The after-hours call center is staffed 24 hours to handle ride assistance and urgent reservation requests (including discharges) 365 days a year. Urgent trips include sick visits, hospital discharge requests and life-sustaining treatment. ? ?Call the [facility] at [phone], at any time, 24 hours a day, 7 days a week. If you are in danger or need immediate medical attention call 911. ? ?If you would like help to quit smoking, call 1-800-QUIT-NOW (339) 465-9441) OR Espa?ol: 1-855-D?jelo-Ya 503-220-1112) o para m?s informaci?n haga clic aqu? or Text READY to 200-400 to register via text ? ?[name], ? ? ?Please see education materials related to heart failure  provided as print materials.  ? ?The patient verbalized understanding of instructions, educational materials, and [name] plan provided today and agreed to receive a mailed copy of patient instructions, educational materials, and [name] plan.  ? ?Telephone follow up appointment with Managed Medicaid [name] management team member scheduled for:09/11/21 @ 1:15pm ? ?Chris Joiner RN, BSN ?[place] ?RN [name] Coordinator ? ? ?Following is a copy of your plan of [name]:  ?[name] Plan : Chris [name]  ?Updates made by [name], RN since 08/12/2021 12:00 AM  ?  ? ?Problem: Knowledge Deficits and [name] Coordination Needs related to long term management of CHF   ?Priority: High  ?  ? ?Long-Range Goal: Development of Plan of [name] to address [name] Coordination Needs and Knowledge Deficits related to CHF   ?Start Date: 04/28/2021  ?Expected End Date: 09/11/2021  ?Priority: High  ?Note:   ?Current Barriers:  ?Knowledge Deficits related to plan of [name] for management of CHF  ?[name] Coordination needs related to Transportation  ?Non-adherence to prescribed medication regimen ?Difficulty obtaining medications ?Chris [name] attended Nuclear Stress Test, he missed calls from the office for result review and attempt to schedule follow up. RNCM also noted in EMR [facility] attempting to contact patient to schedule referral placed by Dr. Wynetta [name]. Chris [name] was glad to receive the contacts and plans to call and schedule today. He reports taking all of his medications. He continues to wait for assistance to repair water leak. ? ?RNCM Clinical Goal(s):  ?Patient will verbalize understanding of plan for management of CHF as evidenced by patient verbalization of plan ?take all medications exactly as prescribed and will call provider  for medication related questions as evidenced by physician notes in EMR    ?attend all scheduled medical appointments: follow up on referrals placed by PCP as evidenced by  physician notes in EMR        ?demonstrate improved adherence to prescribed treatment plan for CHF as evidenced by attending Cardiology 07/10/21 at 9:25am ?work with pharmacist to address Medication procurement related to CHF as evidenced by review of EMR and patient or pharmacist report    through collaboration with RN [name] manager, provider, and [name] team.  ? ?Interventions: ?Inter-disciplinary [name] team collaboration (see longitudinal plan of [name]) ?Evaluation of current treatment plan related to  self management and patient's adherence to plan as established by provider ?Provided patient with [facility] [phone] to schedule appointment-Referral placed by Dr. Wynetta [name] ? ?Advised patient to schedule follow up with Dr. Wynetta [name] ?Advised patient to take all medications with him to all appointments ? ? ?COPD Interventions:  (Status:  Condition stable.  Not addressed this visit.) Long Term Goal ?Provided patient with basic written and verbal COPD education on self [name]/management/and exacerbation prevention ?Advised patient to self assesses COPD action plan zone and make appointment with provider if in the yellow zone for 48 hours without improvement ?Provided education about and advised patient to utilize infection prevention strategies to reduce risk of respiratory infection ?Discussed the importance of adequate rest and management of fatigue with COPD ?Assessed social determinant of health barriers ? ? ?Heart Failure Interventions:  (Status: Goal on Track (progressing): YES.)  Long Term Goal  ?Discussed the importance of keeping all appointments with provider ?Provided patient with education about the role of exercise in the management of heart failure ?Assessed social determinant of health barriers ?Provided patient with Dr. Garen Lah [phone], office has been trying to reach patient to schedule follow up ?Advised patient to take all medications to Cardiology appointment ? ?Patient Goals/Self-[name]  Activities: ?Patient will self administer medications as prescribed as evidenced by self report/primary caregiver report  ?Patient will attend all scheduled provider appointments as evidenced by clinician review of documented attendance to scheduled appointments and patient/caregiver report ?Patient will attend church or other social activities as evidenced by patient report ?Patient will continue to perform ADL's independently as evidenced by patient/caregiver report ?Patient will continue to perform IADL's independently as evidenced by patient/caregiver report ?Patient will call provider office for new concerns or questions as evidenced by review of documented incoming telephone call notes and patient report ?Patient will work with MM Pharmacist for medication management as evidenced by notes in EMR ?call office if I gain more than 2 pounds in one day or 5 pounds in one week ?use salt in moderation ?watch for swelling in feet, ankles and legs every day ?weigh myself daily ?know when to call the doctor  ? ? ?  ? ?  ?

## 2021-08-13 ENCOUNTER — Other Ambulatory Visit: Payer: Self-pay

## 2021-08-13 ENCOUNTER — Telehealth: Payer: Self-pay | Admitting: Internal Medicine

## 2021-08-13 NOTE — Telephone Encounter (Signed)
Pt called in needs orders for labs ?

## 2021-08-13 NOTE — Telephone Encounter (Signed)
Did not see lab orders for patient placed by his PCP. He states he was told to make an appt with PCP. Scheduled apt.  ? ?Patient also instructed to call [phone], Dr. Garen Lah to f/u per note on 08/04/2021. Verbalized understanding.  ?

## 2021-08-20 ENCOUNTER — Encounter: Payer: Self-pay | Admitting: Internal Medicine

## 2021-08-20 ENCOUNTER — Other Ambulatory Visit: Payer: Self-pay

## 2021-08-20 ENCOUNTER — Ambulatory Visit: Payer: Medicaid Other | Attending: Internal Medicine | Admitting: Internal Medicine

## 2021-08-20 VITALS — BP 120/75 | HR 89 | Wt 267.0 lb

## 2021-08-20 DIAGNOSIS — K5901 Slow transit constipation: Secondary | ICD-10-CM

## 2021-08-20 DIAGNOSIS — I5042 Chronic combined systolic (congestive) and diastolic (congestive) heart failure: Secondary | ICD-10-CM

## 2021-08-20 DIAGNOSIS — E1169 Type 2 diabetes mellitus with other specified complication: Secondary | ICD-10-CM

## 2021-08-20 DIAGNOSIS — F17200 Nicotine dependence, unspecified, uncomplicated: Secondary | ICD-10-CM

## 2021-08-20 DIAGNOSIS — E669 Obesity, unspecified: Secondary | ICD-10-CM | POA: Diagnosis not present

## 2021-08-20 DIAGNOSIS — I428 Other cardiomyopathies: Secondary | ICD-10-CM | POA: Diagnosis not present

## 2021-08-20 DIAGNOSIS — N1832 Chronic kidney disease, stage 3b: Secondary | ICD-10-CM

## 2021-08-20 DIAGNOSIS — N2889 Other specified disorders of kidney and ureter: Secondary | ICD-10-CM | POA: Diagnosis not present

## 2021-08-20 DIAGNOSIS — Z23 Encounter for immunization: Secondary | ICD-10-CM

## 2021-08-20 LAB — POCT GLYCOSYLATED HEMOGLOBIN (HGB A1C): Hemoglobin A1C: 6.4 % — AB (ref 4.0–5.6)

## 2021-08-20 LAB — GLUCOSE, POCT (MANUAL RESULT ENTRY): POC Glucose: 71 mg/dl (ref 70–99)

## 2021-08-20 MED ORDER — GLIPIZIDE 5 MG PO TABS
2.5000 mg | ORAL_TABLET | Freq: Every day | ORAL | 3 refills | Status: DC
  Filled 2021-08-20 – 2022-02-12 (×2): qty 45, 90d supply, fill #0

## 2021-08-20 MED ORDER — SENNA-DOCUSATE SODIUM 8.6-50 MG PO TABS
1.0000 | ORAL_TABLET | Freq: Every day | ORAL | 1 refills | Status: DC | PRN
Start: 2021-08-20 — End: 2021-12-21
  Filled 2021-08-20: qty 30, 30d supply, fill #0

## 2021-08-20 MED ORDER — ISOSORB DINITRATE-HYDRALAZINE 20-37.5 MG PO TABS
1.0000 | ORAL_TABLET | Freq: Three times a day (TID) | ORAL | 6 refills | Status: DC
  Filled 2021-08-20: qty 90, 30d supply, fill #0
  Filled 2022-02-12 – 2022-04-22 (×2): qty 90, 30d supply, fill #1

## 2021-08-20 NOTE — Progress Notes (Signed)
Patient ID: Chris [name], male    DOB: 1960-06-16  MRN: [id number]  CC: discuss results (Discuss results , pt has no other concerns )   Subjective: Chris [name] is a 61 y.o. male who presents for chronic ds management His concerns today include:  Pt with hx of NICM EF 25-35% on nuclear ST 07/2021,  combined CHF, HTN, DM type 2, HL, CKD 3b, gout, tob dep, subst abuse (cocaine),  COPD, chronic resp failure with hypoxia was on home O2,. OSA  Patient has his med box with him today with his bottles of medications.  HM: Referred to GI on last visit for colon cancer screening.  Looks like [facility] sent him a MyChart message telling him to call to schedule his appointment.  However patient states he does not know how to get into his MyChart account.   He was also referred for his diabetic eye exam to Dr. Katy Fitch.  He does not think he has received a call as yet.  His voicemail box is full and he does not know how to delete the messages.  C/o constipation x 1 wk.  Stools hard Tried Miralax Reports green leafy vegetables and drinks adequate water.  HYPERTENSION/A.flutter/HFrEF Currently taking: see medication list.  His med box contains Eliquis, atorvastatin, torsemide, carvedilol, Farxiga and BiDil Med Adherence: _0  Yes    _1  No Medication side effects: _2  Yes    _3  No Adherence with salt restriction: _4  Yes    _5  No Home Monitoring?: _6  Yes    _7  No Monitoring Frequency:  Home BP results range:  SOB? _8  Yes sometimes but reports it is much better than before.  _9  No Chest Pain?: _10  Yes    _11  No Leg swelling?: _12  Yes    _13  No Headaches?: _14  Yes    _15  No Dizziness? _16  Yes    _17  No Comments: He saw the cardiologist Dr. Charlestine Night in January.  Lasix changed to torsemide 40 mg daily.  He had a nuclear medicine myocardial stress test.  It was reported as moderate in size, mild in severity and partially reversible mid inferior and mid inferior lateral and apical inferior defect that may  represent scar with peri-infarct ischemia but cannot rule out an element of artifact.  EF was estimated at 25 to 35% with dilation of the left ventricle.  No significant coronary artery calcification minimal aortic atherosclerosis noted. -Cardiologist nurse attempted to reach him 2 times to give him the results but they were unable to leave a voicemail message.  Cardiologist states that the stress test showed fixed infarct defect, no significant ischemia, this could be related to artifacts.  Recommend that patient continues his current medication and to keep follow-up appointment.  Patient was supposed to follow-up in 6 weeks.  On last visit, was ordered for questionable mass seen on the right kidney on CT of the chest that was done November/2022.  Patient states he was never called with an appointment.  CKD 3: Most recent GFR of 38, creat 2.02.  He was referred for follow-up with Chris [name] last visit in December.  He states he has not received a call but notes that his voicemail box is full and not sure how to clear the messages.  Tobacco dependence: Reports that he has cut back to quarter pack of cigarettes per day states he is working on trying to quit.  DM: Results for orders placed or performed in visit on 08/20/21  Glucose (CBG)  Result  Value Ref Range   POC Glucose 71 70 - 99 mg/dl  HgB A1c  Result Value Ref Range   Hemoglobin A1C 6.4 (A) 4.0 - 5.6 %   HbA1c POC (<> result, manual entry)     HbA1c, POC (prediabetic range)     HbA1c, POC (controlled diabetic range)    Reports compliance with low-dose glipizide.  Not checking blood sugars.  He is doing better with his eating habits.  He has been active and tries to do a little workout every day.  His weight is down 8 pounds since January.  This may have been due to the fact that furosemide was changed to torsemide.   Patient Active Problem List   Diagnosis Date Noted   Right kidney mass 05/14/2021   CHF exacerbation  ([place]) 04/14/2021   Chest pain 04/14/2021   Syncope 04/14/2021   Left-sided weakness 10/28/2020   COPD exacerbation (HCC)    Typical atrial flutter (HCC)    CHF (congestive heart failure) ([place]) 07/04/2020   Acute exacerbation of CHF (congestive heart failure) ([place]) 06/16/2020   COPD with acute exacerbation ([place]) 06/16/2020   Influenza vaccine refused 05/06/2020   Acute on chronic combined systolic (congestive) and diastolic (congestive) heart failure ([place]) 05/05/2020   Acute decompensated heart failure ([place]) 05/04/2020   Illiteracy 05/04/2020   Type 2 diabetes mellitus with stage 3 chronic kidney disease ([place]) 12/25/2019   Acute on chronic combined systolic and diastolic CHF (congestive heart failure) ([place]) 10/26/2019   Elevated troponin I level 10/26/2019   Acute on chronic diastolic (congestive) heart failure ([place]) 10/26/2019   History of gout 02/01/2019   Seasonal allergic rhinitis due to pollen 02/01/2019   Tobacco dependence 11/30/2018   Microscopic hematuria 11/30/2018   Depression 11/30/2018   Difficulty controlling anger 11/30/2018   CAP (community acquired pneumonia) 08/11/2018   COPD (chronic obstructive pulmonary disease) (HCC)    CKD (chronic kidney disease) stage 3, GFR 30-59 ml/min ([place]) 08/10/2018   Recurrent epistaxis 04/21/2018   Mixed hyperlipidemia 07/28/2017   Essential hypertension 62/37/6283   Chronic systolic heart failure ([place]) 10/25/2014   Cocaine abuse ([place]) 02/20/2013   Cannabis abuse 02/20/2013   Back pain, chronic 02/20/2013     Current Outpatient Medications on File Prior to Visit  Medication Sig Dispense Refill   Accu-Chek Softclix Lancets lancets Use as instructed 100 each 12   albuterol (PROVENTIL) (2.5 MG/3ML) 0.083% nebulizer solution TAKE 3 MLS BY NEBULIZATION EVERY 6 (SIX) HOURS AS NEEDED FOR SHORTNESS OF BREATH. 90 mL 1   albuterol (VENTOLIN HFA) 108 (90 Base) MCG/ACT inhaler Inhale 2 puffs into the lungs every 6 (six) hours as needed for  wheezing or shortness of breath. 18 g 12   allopurinol (ZYLOPRIM) 100 MG tablet TAKE 2 TABLETS (200 MG TOTAL) BY MOUTH DAILY. 180 tablet 3   apixaban (ELIQUIS) 5 MG TABS tablet TAKE 1 TABLET (5 MG TOTAL) BY MOUTH 2 (TWO) TIMES DAILY. 180 tablet 1   aspirin 81 MG EC tablet Take 1 tablet (81 mg total) by mouth daily. 100 tablet 2   atorvastatin (LIPITOR) 40 MG tablet Take 1 tablet (40 mg total) by mouth daily. 90 tablet 3   Blood Glucose Monitoring Suppl (ACCU-CHEK GUIDE) w/Device KIT Use as directed 1 kit 0   budesonide-formoterol (SYMBICORT) 160-4.5 MCG/ACT inhaler Inhale 2 puffs into the lungs 2 (two) times daily. 10.2 g 12   carvedilol (COREG) 6.25 MG tablet Take 1 tablet (6.25 mg total) by mouth 2 (two) times daily.  180 tablet 3   colchicine 0.6 MG tablet Take 2 tabs (1.2 mg) at the onset of a gout flare, may repeat 1 tab (0.6 mg) after 2 hours if symptoms persist. 30 tablet 1   dapagliflozin propanediol (FARXIGA) 10 MG TABS tablet Take 1 tablet (10 mg total) by mouth daily. 90 tablet 3   glipiZIDE (GLUCOTROL) 5 MG tablet Take 0.5 tablets (2.5 mg total) by mouth 2 (two) times daily before a meal. 90 tablet 3   glucose blood (ACCU-CHEK GUIDE) test strip Use as directed to check blood sugar 1-2 times a day 100 each 12   montelukast (SINGULAIR) 10 MG tablet TAKE 1 TABLET (10 MG TOTAL) BY MOUTH AT BEDTIME. 30 tablet 2   torsemide (DEMADEX) 20 MG tablet Take 2 tablets (40 mg total) by mouth daily. 60 tablet 3   [DISCONTINUED] metoprolol tartrate (LOPRESSOR) 100 MG tablet Take 1 tablet (100 mg total) by mouth 2 (two) times daily. 60 tablet 0   No current facility-administered medications on file prior to visit.    No Known Allergies  Social History   Socioeconomic History   Marital status: Divorced    Spouse name: Not on file   Number of children: 3   Years of education: Not on file   Highest education level: High school graduate  Occupational History   Occupation: disability  Tobacco Use    Smoking status: Every Day    Packs/day: 1.00    Years: 43.00    Pack years: 43.00    Types: Cigarettes   Smokeless tobacco: Never   Tobacco comments:    4 cigarettes/day x 2 month (04/14/2021)  Vaping Use   Vaping Use: Never used  Substance and Sexual Activity   Alcohol use: No   Drug use: Yes    Frequency: 21.0 times per week    Types: Marijuana, Cocaine    Comment: last use Cocaine- 03/28/2021. Last use marijuana 03/14/2021.   Sexual activity: Not on file  Other Topics Concern   Not on file  Social History Narrative   ** Merged History Encounter **       Social Determinants of Health   Financial Resource Strain: Medium Risk   Difficulty of Paying Living Expenses: Somewhat hard  Food Insecurity: No Food Insecurity   Worried About Charity fundraiser in the Last Year: Never true   Arboriculturist in the Last Year: Never true  Transportation Needs: Unmet Transportation Needs   Lack of Transportation (Medical): Yes   Lack of Transportation (Non-Medical): Yes  Physical Activity: Not on file  Stress: Not on file  Social Connections: Moderately Isolated   Frequency of Communication with Friends and Family: More than three times a week   Frequency of Social Gatherings with Friends and Family: Once a week   Attends Religious Services: More than 4 times per year   Active Member of [employer] or Organizations: No   Attends Archivist Meetings: Never   Marital Status: Divorced  Human resources officer Violence: Not on file    Family History  Problem Relation Age of Onset   Heart disease Father    Diabetes Mother    HIV Brother    Healthy Son    Healthy Daughter     Past Surgical History:  Procedure Laterality Date   [place] N/A 07/08/2020   Procedure: CARDIOVERSION;  Surgeon: Corey Skains, MD;  Location: [facility] ORS;  Service: Cardiovascular;  Laterality: N/A;   HERNIA REPAIR     x2   SHOULDER SURGERY     TEE  WITHOUT CARDIOVERSION N/A 07/08/2020   Procedure: TRANSESOPHAGEAL ECHOCARDIOGRAM (TEE);  Surgeon: Corey Skains, MD;  Location: [facility] ORS;  Service: Cardiovascular;  Laterality: N/A;    ROS: Review of Systems Negative except as stated above  PHYSICAL EXAM: BP 120/75    Pulse 89    Wt 267 lb (121.1 kg)    SpO2 97%    BMI 36.21 kg/m   Wt Readings from Last 3 Encounters:  08/20/21 267 lb (121.1 kg)  07/10/21 275 lb (124.7 kg)  06/17/21 279 lb (126.6 kg)    Physical Exam   General appearance - alert, well appearing, and in no distress Mental status - normal mood, behavior, speech, dress, motor activity, and thought processes Mouth - mucous membranes moist, pharynx normal without lesions Neck - supple, no significant adenopathy Chest - clear to auscultation, no wheezes, rales or rhonchi, symmetric air entry Heart - normal rate, regular rhythm, normal S1, S2, no murmurs, rubs, clicks or gallops Extremities - no LE edema  CMP Latest Ref Rng & Units 07/20/2021 04/27/2021 04/15/2021  Glucose 70 - 99 mg/dL 89 99 136(H)  BUN 6 - 24 mg/dL 32(H) 20 34(H)  Creatinine 0.76 - 1.27 mg/dL 2.02(H) 1.65(H) 2.12(H)  Sodium 134 - 144 mmol/L 137 137 134(L)  Potassium 3.5 - 5.2 mmol/L 4.3 5.0 4.8  Chloride 96 - 106 mmol/L 97 105 102  CO2 20 - 29 mmol/L _0 Calcium 8.7 - 10.2 mg/dL 9.9 9.0 9.5  Total Protein 6.5 - 8.1 g/dL - 6.8 -  Total Bilirubin 0.3 - 1.2 mg/dL - 0.6 -  Alkaline Phos 38 - 126 U/L - 68 -  AST 15 - 41 U/L - 22 -  ALT 0 - 44 U/L - 23 -   Lipid Panel     Component Value Date/Time   CHOL 176 12/25/2019 0632   CHOL 160 11/07/2019 0918   CHOL 157 10/21/2013 0435   TRIG 91 12/25/2019 0632   TRIG 117 10/21/2013 0435   HDL 37 (L) 12/25/2019 0632   HDL 40 11/07/2019 0918   HDL 30 (L) 10/21/2013 0435   CHOLHDL 4.8 12/25/2019 0632   VLDL 18 12/25/2019 0632   VLDL 23 10/21/2013 0435   LDLCALC 121 (H) 12/25/2019 0632   LDLCALC 92 11/07/2019 0918   LDLCALC 104 (H) 10/21/2013  0435    CBC    Component Value Date/Time   WBC 13.2 (H) 04/27/2021 1039   RBC 5.29 04/27/2021 1039   HGB 15.5 04/27/2021 1039   HGB 15.4 11/07/2019 0918   HCT 49.7 04/27/2021 1039   HCT 46.2 11/07/2019 0918   PLT 150 04/27/2021 1039   PLT 245 11/07/2019 0918   MCV 94.0 04/27/2021 1039   MCV 90 11/07/2019 0918   MCV 92 10/21/2013 0435   MCH 29.3 04/27/2021 1039   MCHC 31.2 04/27/2021 1039   RDW 15.7 (H) 04/27/2021 1039   RDW 14.2 11/07/2019 0918   RDW 15.7 (H) 10/21/2013 0435   LYMPHSABS 2.9 02/09/2021 0830   LYMPHSABS 1.1 10/21/2013 0435   MONOABS 1.0 02/09/2021 0830   MONOABS 0.6 10/21/2013 0435   EOSABS 0.3 02/09/2021 0830   EOSABS 0.0 10/21/2013 0435   BASOSABS 0.0 02/09/2021 0830   BASOSABS 0.0 10/21/2013 0435    ASSESSMENT AND PLAN: 1. Type 2 diabetes mellitus with obesity ([place]) Patient to continue healthy  eating habits as as tolerated. Continue [place] Given his likely low blood sugar today, poor kidney function and A1C at goal, I will have him cut back on glipizide 2.5 mg twice a day to 2.5 mg once a day.   - Glucose (CBG) - HgB A1c  2. NICM (nonischemic cardiomyopathy) ([place]) 3. Chronic combined systolic and diastolic CHF (congestive heart failure) (HCC) Compensated and stable.  Continue BiDil, Farxiga carvedilol torsemide, We will try to have them schedule his follow-up visit with the cardiologist. - Ambulatory referral to Cardiology - isosorbide-hydrALAZINE (BIDIL) 20-37.5 MG tablet; Take 1 tablet by mouth 3 (three) times daily.  Dispense: 90 tablet; Refill: 6  4. Right kidney mass I had my CMA today call and reschedule the ultrasound.  Patient was given date and time before leaving.  5. Stage 3b chronic kidney disease ([place]) Advised to avoid NSAIDs. - Ambulatory referral to Nephrology  6. Constipation by delayed colonic transit Encouraged him to continue eating green leafy vegetables and also fruits which are high in fiber.  We will give a trial of  Senokot-S. - sennosides-docusate sodium (SENOKOT-S) 8.6-50 MG tablet; Take 1 tablet by mouth daily as needed for constipation.  Dispense: 30 tablet; Refill: 1  7. Tobacco dependence Commended him on cutting back.  Continue to encourage him towards quitting.  8. Need for shingles vaccine Due for Shingrix vaccine series.  Patient is agreeable to receiving first shot today.  Can cause some redness and swelling at the injection site. - Varicella-zoster vaccine IM (Shingrix)  He was provided with the phone numbers for [facility] to call and schedule his diabetic eye exam.  He was also provided with the phone number for [facility] gastroenterology to call and schedule his colonoscopy. #s printed on dischg summary.  Patient was given the opportunity to ask questions.  Patient verbalized understanding of the plan and was able to repeat key elements of the plan.   This documentation was completed using Radio producer.  Any transcriptional errors are unintentional.  Orders Placed This Encounter  Procedures   Varicella-zoster vaccine IM (Shingrix)   Ambulatory referral to Cardiology   Ambulatory referral to Nephrology   Glucose (CBG)   HgB A1c     Requested Prescriptions   Signed Prescriptions Disp Refills   isosorbide-hydrALAZINE (BIDIL) 20-37.5 MG tablet 90 tablet 6    Sig: Take 1 tablet by mouth 3 (three) times daily.   sennosides-docusate sodium (SENOKOT-S) 8.6-50 MG tablet 30 tablet 1    Sig: Take 1 tablet by mouth daily as needed for constipation.    Return in about 4 months (around 12/20/2021).  Karle Plumber, MD, FACP

## 2021-08-20 NOTE — Patient Instructions (Addendum)
Call [facility] to schedule colonoscopy:  [phone]. ? ?Call Dr. [facility] ph # [phone] (address [address]) to schedule your eye exam. ? ?

## 2021-08-21 ENCOUNTER — Other Ambulatory Visit: Payer: Self-pay

## 2021-08-24 ENCOUNTER — Encounter: Payer: Self-pay | Admitting: Gastroenterology

## 2021-08-26 ENCOUNTER — Ambulatory Visit (HOSPITAL_COMMUNITY): Admission: RE | Admit: 2021-08-26 | Payer: Medicaid Other | Source: Ambulatory Visit

## 2021-08-31 ENCOUNTER — Other Ambulatory Visit: Payer: Self-pay

## 2021-09-01 NOTE — Telephone Encounter (Signed)
Called and spoke with patient. Reviewed Myoview Stress test. Scheduled patient to come in this week on 09/03/21. ? ? ?

## 2021-09-03 ENCOUNTER — Other Ambulatory Visit: Payer: Self-pay

## 2021-09-03 ENCOUNTER — Ambulatory Visit (INDEPENDENT_AMBULATORY_CARE_PROVIDER_SITE_OTHER): Payer: Medicaid Other | Admitting: Cardiology

## 2021-09-03 ENCOUNTER — Encounter: Payer: Self-pay | Admitting: Cardiology

## 2021-09-03 VITALS — BP 140/90 | HR 70 | Ht 72.0 in | Wt 270.0 lb

## 2021-09-03 DIAGNOSIS — F17200 Nicotine dependence, unspecified, uncomplicated: Secondary | ICD-10-CM | POA: Diagnosis not present

## 2021-09-03 DIAGNOSIS — I5020 Unspecified systolic (congestive) heart failure: Secondary | ICD-10-CM | POA: Diagnosis not present

## 2021-09-03 DIAGNOSIS — I4892 Unspecified atrial flutter: Secondary | ICD-10-CM | POA: Diagnosis not present

## 2021-09-03 DIAGNOSIS — I10 Essential (primary) hypertension: Secondary | ICD-10-CM

## 2021-09-03 NOTE — Progress Notes (Signed)
?Cardiology Office Note:   ? ?Date:  09/03/2021  ? ?ID:  Chris [name], DOB 02-23-1961, MRN [id number] ? ?PCP:  [name], MD  ?[facility] Cardiologist:   ?[place] Electrophysiologist:  None  ? ?Referring MD: [name], MD  ? ?Chief Complaint  ?Patient presents with  ? Other  ?  6 week follow up post testing. Meds reviewed verbally with patient.   ? ? ?History of Present Illness:   ? ?Chris [name] is a 62 y.o. male with a hx of hypertension, obesity, HFrEF EF 25%, atrial flutter s/p DCCV 06/2020, COPD, current smoker x40+ years, former cocaine use x25+ years, CKD who presents for follow-up.   ? ?Patient is being seen due to HFrEF EF 25%.  Lexiscan Myoview was ordered to avoid contrast due to CKD.  Was switched from Lasix to torsemide.  Denies leg edema, patient not sure of all of his current medication dosage, or what he is taking.  Did not take his BP meds this morning.  He otherwise feels well, he still smokes, is working on quitting. ? ? ?Prior notes ?Lexiscan Myoview 07/2021 fixed inferior inferolateral defect, no significant coronary calcifications, no significant ischemia. ?Suddenly admitted for shortness of breath, found to be in atrial flutter with RVR.   ?Underwent a TEE guided DC cardioversion 06/2020 .  ?Echocardiogram obtained 06/2020 while in the hospital showed EF of 25%.  ?left heart cath 2005 at [facility] years ago with no obstructive disease. ? ?Past Medical History:  ?Diagnosis Date  ? Arrhythmia   ? atrial flutter  ? CHF (congestive heart failure) ([place])   ? Chronic kidney disease   ? COPD (chronic obstructive pulmonary disease) ([place])   ? Coronary artery disease   ? Depression   ? Diabetes mellitus without complication ([place])   ? GERD (gastroesophageal reflux disease)   ? Gout   ? Hypertension   ? Influenza A with respiratory manifestations   ? Mental disorder   ? ? ?Past Surgical History:  ?Procedure Laterality Date  ? ANKLE SURGERY    ? CARDIAC CATHETERIZATION    ?  CARDIOVERSION N/A 07/08/2020  ? Procedure: CARDIOVERSION;  Surgeon: Corey Skains, MD;  Location: [facility] ORS;  Service: Cardiovascular;  Laterality: N/A;  ? HERNIA REPAIR    ? x2  ? SHOULDER SURGERY    ? TEE WITHOUT CARDIOVERSION N/A 07/08/2020  ? Procedure: TRANSESOPHAGEAL ECHOCARDIOGRAM (TEE);  Surgeon: Corey Skains, MD;  Location: [facility] ORS;  Service: Cardiovascular;  Laterality: N/A;  ? ? ?Current Medications: ?Current Meds  ?Medication Sig  ? Accu-Chek Softclix Lancets lancets Use as instructed  ? albuterol (PROVENTIL) (2.5 MG/3ML) 0.083% nebulizer solution TAKE 3 MLS BY NEBULIZATION EVERY 6 (SIX) HOURS AS NEEDED FOR SHORTNESS OF BREATH.  ? albuterol (VENTOLIN HFA) 108 (90 Base) MCG/ACT inhaler Inhale 2 puffs into the lungs every 6 (six) hours as needed for wheezing or shortness of breath.  ? allopurinol (ZYLOPRIM) 100 MG tablet TAKE 2 TABLETS (200 MG TOTAL) BY MOUTH DAILY.  ? apixaban (ELIQUIS) 5 MG TABS tablet TAKE 1 TABLET (5 MG TOTAL) BY MOUTH 2 (TWO) TIMES DAILY.  ? aspirin 81 MG EC tablet Take 1 tablet (81 mg total) by mouth daily.  ? atorvastatin (LIPITOR) 40 MG tablet Take 1 tablet (40 mg total) by mouth daily.  ? Blood Glucose Monitoring Suppl (ACCU-CHEK GUIDE) w/Device KIT Use as directed  ? budesonide-formoterol (SYMBICORT) 160-4.5 MCG/ACT inhaler Inhale 2 puffs into the lungs 2 (two) times  daily.  ? carvedilol (COREG) 6.25 MG tablet Take 1 tablet (6.25 mg total) by mouth 2 (two) times daily.  ? colchicine 0.6 MG tablet Take 2 tabs (1.2 mg) at the onset of a gout flare, may repeat 1 tab (0.6 mg) after 2 hours if symptoms persist.  ? dapagliflozin propanediol (FARXIGA) 10 MG TABS tablet Take 1 tablet (10 mg total) by mouth daily.  ? glipiZIDE (GLUCOTROL) 5 MG tablet Take 0.5 tablets (2.5 mg total) by mouth daily before breakfast.  ? glucose blood (ACCU-CHEK GUIDE) test strip Use as directed to check blood sugar 1-2 times a day  ? isosorbide-hydrALAZINE (BIDIL) 20-37.5 MG tablet Take 1 tablet by  mouth 3 (three) times daily.  ? montelukast (SINGULAIR) 10 MG tablet TAKE 1 TABLET (10 MG TOTAL) BY MOUTH AT BEDTIME.  ? sennosides-docusate sodium (SENOKOT-S) 8.6-50 MG tablet Take 1 tablet by mouth daily as needed for constipation.  ? torsemide (DEMADEX) 20 MG tablet Take 2 tablets (40 mg total) by mouth daily.  ?  ? ?Allergies:   Patient has no known allergies.  ? ?Social History  ? ?Socioeconomic History  ? Marital status: Divorced  ?  Spouse name: Not on file  ? Number of children: 3  ? Years of education: Not on file  ? Highest education level: High school graduate  ?Occupational History  ? Occupation: disability  ?Tobacco Use  ? Smoking status: Every Day  ?  Packs/day: 1.00  ?  Years: 43.00  ?  Pack years: 43.00  ?  Types: Cigarettes  ? Smokeless tobacco: Never  ? Tobacco comments:  ?  4 cigarettes/day x 2 month (04/14/2021)  ?Vaping Use  ? Vaping Use: Never used  ?Substance and Sexual Activity  ? Alcohol use: No  ? Drug use: Yes  ?  Frequency: 21.0 times per week  ?  Types: Marijuana, Cocaine  ?  Comment: last use Cocaine- 03/28/2021. Last use marijuana 03/14/2021.  ? Sexual activity: Not on file  ?Other Topics Concern  ? Not on file  ?Social History Narrative  ? ** Merged History Encounter **  ?    ? ?Social Determinants of Health  ? ?Financial Resource Strain: Medium Risk  ? Difficulty of Paying Living Expenses: Somewhat hard  ?Food Insecurity: No Food Insecurity  ? Worried About Charity fundraiser in the Last Year: Never true  ? Ran Out of Food in the Last Year: Never true  ?Transportation Needs: Unmet Transportation Needs  ? Lack of Transportation (Medical): Yes  ? Lack of Transportation (Non-Medical): Yes  ?Physical Activity: Not on file  ?Stress: Not on file  ?Social Connections: Moderately Isolated  ? Frequency of Communication with Friends and Family: More than three times a week  ? Frequency of Social Gatherings with Friends and Family: Once a week  ? Attends Religious Services: More than 4 times per  year  ? Active Member of Clubs or Organizations: No  ? Attends Archivist Meetings: Never  ? Marital Status: Divorced  ?  ? ?Family History: ?The patient's family history includes Diabetes in his mother; HIV in his brother; Healthy in his daughter and son; Heart disease in his father. ? ?ROS:   ?Please see the history of present illness.    ? All other systems reviewed and are negative. ? ?EKGs/Labs/Other Studies Reviewed:   ? ?The following studies were reviewed today: ? ? ?EKG:  EKG not ordered today.  ? ?Recent Labs: ?02/11/2021: Magnesium 2.0 ?04/27/2021: ALT 23; B  Natriuretic Peptide 272.0; Hemoglobin 15.5; Platelets 150 ?07/20/2021: BUN 32; Creatinine, Ser 2.02; Potassium 4.3; Sodium 137  ?Recent Lipid Panel ?   ?Component Value Date/Time  ? CHOL 176 12/25/2019 0632  ? CHOL 160 11/07/2019 0918  ? CHOL 157 10/21/2013 0435  ? TRIG 91 12/25/2019 0632  ? TRIG 117 10/21/2013 0435  ? HDL 37 (L) 12/25/2019 1856  ? HDL 40 11/07/2019 0918  ? HDL 30 (L) 10/21/2013 0435  ? CHOLHDL 4.8 12/25/2019 3149  ? VLDL 18 12/25/2019 0632  ? VLDL 23 10/21/2013 0435  ? [place] 121 (H) 12/25/2019 7026  ? [place] 92 11/07/2019 0918  ? [place] 104 (H) 10/21/2013 0435  ? ? ? ?Risk Assessment/Calculations:   ? ? ? ?Physical Exam:   ? ?VS:  BP 140/90 (BP Location: Right Arm, Patient Position: Sitting, Cuff Size: Large)   Pulse 70   Ht 6' (1.829 m)   Wt 270 lb (122.5 kg)   SpO2 96%   BMI 36.62 kg/m?    ? ?Wt Readings from Last 3 Encounters:  ?09/03/21 270 lb (122.5 kg)  ?08/20/21 267 lb (121.1 kg)  ?07/10/21 275 lb (124.7 kg)  ?  ? ?GEN:  Well nourished, well developed in no acute distress ?HEENT: Normal ?NECK: No JVD; No carotid bruits ?CARDIAC: RRR, no murmurs, rubs, gallops ?RESPIRATORY: Decreased breath sounds at bases, no wheezing ?ABDOMEN: Soft, non-tender, distended ?MUSCULOSKELETAL:  No edema; No deformity  ?SKIN: Warm and dry ?NEUROLOGIC:  Alert and oriented x 3 ?PSYCHIATRIC:  Normal affect  ? ?ASSESSMENT:   ? ?1. HFrEF  (heart failure with reduced ejection fraction) ([place])   ?2. Atrial flutter, unspecified type ([place])   ?3. Primary hypertension   ?4. Smoking   ? ? ?PLAN:   ? ?In order of problems listed above: ? ?HFrEF, EF 25%, like

## 2021-09-03 NOTE — Patient Instructions (Addendum)
Medication Instructions:  ? ?We will discuss medication instructions after your nurse visit when we have been able to review all of your current medications. ? ? ?*If you need a refill on your cardiac medications before your next appointment, please call your pharmacy* ? ? ?Lab Work: ? ?BMP drawn today ? ?If you have labs (blood work) drawn today and your tests are completely normal, you will receive your results only by: ?MyChart Message (if you have MyChart) OR ?A paper copy in the mail ?If you have any lab test that is abnormal or we need to change your treatment, we will call you to review the results. ? ? ?Testing/Procedures: ? ?None ordered ? ? ?Follow-Up: ?At [facility], you and your health needs are our priority.  As part of our continuing mission to provide you with exceptional heart care, we have created designated Provider Care Teams.  These Care Teams include your primary Cardiologist (physician) and Advanced Practice Providers (APPs -  Physician Assistants and Nurse Practitioners) who all work together to provide you with the care you need, when you need it. ? ?We recommend signing up for the patient portal called "MyChart".  Sign up information is provided on this After Visit Summary.  MyChart is used to connect with patients for Virtual Visits (Telemedicine).  Patients are able to view lab/test results, encounter notes, upcoming appointments, etc.  Non-urgent messages can be sent to your provider as well.   ?To learn more about what you can do with MyChart, go to [email or web address].   ? ?Your next appointment:   ? ? Nurse visit asap  ? ?2.    6 week(s) with Dr. Garen Lah. ? ?The format for your next appointment:   ?In Person ? ?Provider:   ?[name], MD  ? ? ?Other Instructions ? ? ?

## 2021-09-04 LAB — BASIC METABOLIC PANEL
BUN/Creatinine Ratio: 15 (ref 9–20)
BUN: 30 mg/dL — ABNORMAL HIGH (ref 6–24)
CO2: 25 mmol/L (ref 20–29)
Calcium: 9.4 mg/dL (ref 8.7–10.2)
Chloride: 98 mmol/L (ref 96–106)
Creatinine, Ser: 2.03 mg/dL — ABNORMAL HIGH (ref 0.76–1.27)
Glucose: 107 mg/dL — ABNORMAL HIGH (ref 70–99)
Potassium: 5.1 mmol/L (ref 3.5–5.2)
Sodium: 136 mmol/L (ref 134–144)
eGFR: 37 mL/min/{1.73_m2} — ABNORMAL LOW (ref >59)

## 2021-09-07 ENCOUNTER — Ambulatory Visit: Payer: Medicaid Other

## 2021-09-10 ENCOUNTER — Ambulatory Visit (INDEPENDENT_AMBULATORY_CARE_PROVIDER_SITE_OTHER): Payer: Medicaid Other | Admitting: Gastroenterology

## 2021-09-10 ENCOUNTER — Encounter: Payer: Self-pay | Admitting: Gastroenterology

## 2021-09-10 VITALS — BP 130/72 | HR 76 | Ht 72.0 in | Wt 271.4 lb

## 2021-09-10 DIAGNOSIS — R1084 Generalized abdominal pain: Secondary | ICD-10-CM

## 2021-09-10 DIAGNOSIS — K5900 Constipation, unspecified: Secondary | ICD-10-CM

## 2021-09-10 DIAGNOSIS — Z1211 Encounter for screening for malignant neoplasm of colon: Secondary | ICD-10-CM

## 2021-09-10 DIAGNOSIS — Z1212 Encounter for screening for malignant neoplasm of rectum: Secondary | ICD-10-CM | POA: Diagnosis not present

## 2021-09-10 NOTE — Patient Instructions (Addendum)
If you are age 59 or older, your body mass index should be between 23-30. Your Body mass index is 36.81 kg/m?[name] If this is out of the aforementioned range listed, please consider follow up with your Primary Care Provider. ? ?If you are age 25 or younger, your body mass index should be between 19-25. Your Body mass index is 36.81 kg/m?[name] If this is out of the aformentioned range listed, please consider follow up with your Primary Care Provider.  ? ?[facility] providers would like to encourage you to use [facility] to communicate with providers for non-urgent requests or questions.  Due to long hold times on the telephone, sending your provider a message by [facility] may be a faster and more efficient way to get a response.  Please allow 48 business hours for a response.  Please remember that this is for non-urgent requests.  ? ?Your provider has requested that you go to the basement level for lab work before leaving today. Press "B" on the elevator. The lab is located at the first door on the left as you exit the elevator.  ? ?Dr .Candis Schatz recommends that you complete a bowel purge (to clean out your bowels). Please do the following: ?Purchase a bottle of Miralax over the counter as well as a box of 5 mg dulcolax tablets. ?Take 4 dulcolax tablets. ?Wait 1 hour. ?You will then drink 6-8 capfuls of Miralax mixed in an adequate amount of water/juice/gatorade (you may choose which of these liquids to drink) over the next 2-3 hours. ?You should expect results within 1 to 6 hours after completing the bowel purge. ? ?Continue Miralax daily daily after completing Bowel purge. ? ?It was a pleasure to see you today! ? ?Thank you for trusting me with your gastrointestinal care!   ? ? ? ? ?

## 2021-09-10 NOTE — Progress Notes (Signed)
? ?HPI : Chris [name] is a very pleasant 61 year old male with a history of nonischemic congestive heart failure (most recent EF 30 to 35%), atrial flutter, CKD, COPD/chronic tobacco use who is referred to [place] by Dr. Karle Plumber for initial screening colonoscopy. ?The patient has been having issues with constipation and abdominal pain for the past month or so.  He feels full, bloated and uncomfortable most of the time.  He reports diffuse abdominal pain often described as a burning sensation.  Pain is more noticeable in the morning, sometimes in the lower abdomen, sometimes in the upper abdomen.  No associated symptoms such as nausea or vomiting.  He denies symptoms of heartburn or acid regurgitation.  No dysphagia. ?He has bowel movements usually once a day.  His stools are frequently small and hard and sometimes difficult to pass.  Having a bowel movement provides with little relief.  Diarrhea is not a problem for him.  He denies any blood in the stool.  His appetite is good and his weight is stable. ?To improve his bowel movements, he tried taking multiple tabs of senna (8 tablets total) without a satisfactory bowel movement.  He also drank a whole bottle of milk of magnesia without any bowel movement.  He admits that he eats a lot of meat and not a lot of vegetables, but does eat oatmeal and rice) on a daily basis. ? ?Patiently recently underwent [place] study (in lieu of coronary catheterization due to CKD) which showed no reversible ischemia and a questionable fixed defect versus artifact and continued depressed EF.  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  His BiDil dose was increased to 2 tabs 3 times daily.  He is also on Coreg and [place], as well as Eliquis for his atrial flutter. ?He has recently started exercising again, mainly weight resistance exercises.  He reports being able to go up at least 2 flights of stairs without stopping or  getting significantly short of breath. ? ? ?Past Medical History:  ?Diagnosis Date  ? Arrhythmia   ? atrial flutter  ? CHF (congestive heart failure) ([place])   ? Chronic kidney disease   ? COPD (chronic obstructive pulmonary disease) ([place])   ? Coronary artery disease   ? Depression   ? Diabetes mellitus without complication ([place])   ? GERD (gastroesophageal reflux disease)   ? Gout   ? Hypertension   ? Influenza A with respiratory manifestations   ? Mental disorder   ? ? ? ?Past Surgical History:  ?Procedure Laterality Date  ? ANKLE SURGERY    ? CARDIAC CATHETERIZATION    ? CARDIOVERSION N/A 07/08/2020  ? Procedure: CARDIOVERSION;  Surgeon: Corey Skains, MD;  Location: [facility] ORS;  Service: Cardiovascular;  Laterality: N/A;  ? HERNIA REPAIR    ? x2  ? SHOULDER SURGERY    ? TEE WITHOUT CARDIOVERSION N/A 07/08/2020  ? Procedure: TRANSESOPHAGEAL ECHOCARDIOGRAM (TEE);  Surgeon: Corey Skains, MD;  Location: [facility] ORS;  Service: Cardiovascular;  Laterality: N/A;  ? ?Family History  ?Problem Relation Age of Onset  ? Heart disease Father   ? Diabetes Mother   ? HIV Brother   ? Healthy Son   ? Healthy Daughter   ? ?Social History  ? ?Tobacco Use  ? Smoking status: Every Day  ?  Packs/day: 1.00  ?  Years: 43.00  ?  Pack years: 43.00  ?  Types: Cigarettes  ? Smokeless tobacco: Never  ?  Tobacco comments:  ?  4 cigarettes/day x 2 month (04/14/2021)  ?Vaping Use  ? Vaping Use: Never used  ?Substance Use Topics  ? Alcohol use: No  ? Drug use: Yes  ?  Frequency: 21.0 times per week  ?  Types: Marijuana, Cocaine  ?  Comment: last use Cocaine- 03/28/2021. Last use marijuana 03/14/2021.  ? ?Current Outpatient Medications  ?Medication Sig Dispense Refill  ? Accu-Chek Softclix Lancets lancets Use as instructed 100 each 12  ? albuterol (PROVENTIL) (2.5 MG/3ML) 0.083% nebulizer solution TAKE 3 MLS BY NEBULIZATION EVERY 6 (SIX) HOURS AS NEEDED FOR SHORTNESS OF BREATH. 90 mL 1  ? albuterol (VENTOLIN HFA) 108 (90 Base) MCG/ACT inhaler Inhale  2 puffs into the lungs every 6 (six) hours as needed for wheezing or shortness of breath. 18 g 12  ? allopurinol (ZYLOPRIM) 100 MG tablet TAKE 2 TABLETS (200 MG TOTAL) BY MOUTH DAILY. 180 tablet 3  ? apixaban (ELIQUIS) 5 MG TABS tablet TAKE 1 TABLET (5 MG TOTAL) BY MOUTH 2 (TWO) TIMES DAILY. 180 tablet 1  ? aspirin 81 MG EC tablet Take 1 tablet (81 mg total) by mouth daily. 100 tablet 2  ? atorvastatin (LIPITOR) 40 MG tablet Take 1 tablet (40 mg total) by mouth daily. 90 tablet 3  ? Blood Glucose Monitoring Suppl (ACCU-CHEK GUIDE) w/Device KIT Use as directed 1 kit 0  ? budesonide-formoterol (SYMBICORT) 160-4.5 MCG/ACT inhaler Inhale 2 puffs into the lungs 2 (two) times daily. 10.2 g 12  ? carvedilol (COREG) 6.25 MG tablet Take 1 tablet (6.25 mg total) by mouth 2 (two) times daily. 180 tablet 3  ? colchicine 0.6 MG tablet Take 2 tabs (1.2 mg) at the onset of a gout flare, may repeat 1 tab (0.6 mg) after 2 hours if symptoms persist. 30 tablet 1  ? dapagliflozin propanediol (FARXIGA) 10 MG TABS tablet Take 1 tablet (10 mg total) by mouth daily. 90 tablet 3  ? glipiZIDE (GLUCOTROL) 5 MG tablet Take 0.5 tablets (2.5 mg total) by mouth daily before breakfast. 45 tablet 3  ? glucose blood (ACCU-CHEK GUIDE) test strip Use as directed to check blood sugar 1-2 times a day 100 each 12  ? isosorbide-hydrALAZINE (BIDIL) 20-37.5 MG tablet Take 1 tablet by mouth 3 (three) times daily. 90 tablet 6  ? montelukast (SINGULAIR) 10 MG tablet TAKE 1 TABLET (10 MG TOTAL) BY MOUTH AT BEDTIME. 30 tablet 2  ? sennosides-docusate sodium (SENOKOT-S) 8.6-50 MG tablet Take 1 tablet by mouth daily as needed for constipation. 30 tablet 1  ? torsemide (DEMADEX) 20 MG tablet Take 2 tablets (40 mg total) by mouth daily. 60 tablet 3  ? ?No current facility-administered medications for this visit.  ? ?No Known Allergies ? ? ?Review of Systems: ?All systems reviewed and negative except where noted in HPI.  ? ? ?No results found. ? ?Physical Exam: ?BP  130/72   Pulse 76   Ht 6' (1.829 m)   Wt 271 lb 6.4 oz (123.1 kg)   BMI 36.81 kg/m?  ?Constitutional: Pleasant,well-developed, African-American male in no acute distress. ?HEENT: Normocephalic and atraumatic. Conjunctivae are normal. No scleral icterus. ?Neck supple.  ?Cardiovascular: Normal rate, regular rhythm.  ?Pulmonary/chest: Effort normal and breath sounds normal. No rales or rhonchi.  Faint expiratory wheezes bilaterally ?Abdominal: Soft, nondistended, multifocal tenderness to palpation in the left and right lower quadrants, as well as left upper quadrant.  No rigidity or guarding. Bowel sounds active throughout. There are no masses palpable. No hepatomegaly. ?  Extremities: no edema ?Neurological: Alert and oriented to person place and time. ?Skin: Skin is warm and dry. No rashes noted. ?Psychiatric: Normal mood and affect. Behavior is normal. ? ?CBC ?   ?Component Value Date/Time  ? WBC 13.2 (H) 04/27/2021 1039  ? RBC 5.29 04/27/2021 1039  ? HGB 15.5 04/27/2021 1039  ? HGB 15.4 11/07/2019 0918  ? HCT 49.7 04/27/2021 1039  ? HCT 46.2 11/07/2019 0918  ? PLT 150 04/27/2021 1039  ? PLT 245 11/07/2019 0918  ? MCV 94.0 04/27/2021 1039  ? MCV 90 11/07/2019 0918  ? MCV 92 10/21/2013 0435  ? MCH 29.3 04/27/2021 1039  ? MCHC 31.2 04/27/2021 1039  ? RDW 15.7 (H) 04/27/2021 1039  ? RDW 14.2 11/07/2019 0918  ? RDW 15.7 (H) 10/21/2013 0435  ? LYMPHSABS 2.9 02/09/2021 0830  ? LYMPHSABS 1.1 10/21/2013 0435  ? MONOABS 1.0 02/09/2021 0830  ? MONOABS 0.6 10/21/2013 0435  ? EOSABS 0.3 02/09/2021 0830  ? EOSABS 0.0 10/21/2013 0435  ? BASOSABS 0.0 02/09/2021 0830  ? BASOSABS 0.0 10/21/2013 0435  ? ? ?CMP  ?   ?Component Value Date/Time  ? NA 136 09/03/2021 0842  ? NA 137 10/23/2013 0514  ? K 5.1 09/03/2021 0842  ? K 4.9 10/23/2013 0514  ? CL 98 09/03/2021 0842  ? CL 104 10/23/2013 0514  ? CO2 25 09/03/2021 0842  ? CO2 27 10/23/2013 0514  ? GLUCOSE 107 (H) 09/03/2021 0842  ? GLUCOSE 99 04/27/2021 1039  ? GLUCOSE 115 (H)  10/23/2013 0514  ? BUN 30 (H) 09/03/2021 1886  ? BUN 39 (H) 10/23/2013 0514  ? CREATININE 2.03 (H) 09/03/2021 7737  ? CREATININE 1.97 (H) 01/14/2020 1228  ? CALCIUM 9.4 09/03/2021 0842  ? CALCIUM 9.0 10/23/2013

## 2021-09-11 ENCOUNTER — Other Ambulatory Visit: Payer: Self-pay | Admitting: Internal Medicine

## 2021-09-11 ENCOUNTER — Other Ambulatory Visit: Payer: Self-pay | Admitting: *Deleted

## 2021-09-11 DIAGNOSIS — N2889 Other specified disorders of kidney and ureter: Secondary | ICD-10-CM

## 2021-09-11 NOTE — Patient Instructions (Signed)
Visit Information ? ?Chris [name] was given information about Medicaid Managed Care team care coordination services as a part of their [employer] Medicaid benefit. Chris [name] verbally consented to engagement with the [facility] Managed Care team.  ? ?If you are experiencing a medical emergency, please call 911 or report to your local emergency department or urgent care.  ? ?If you have a non-emergency medical problem during routine business hours, please contact your provider's office and ask to speak with a nurse.  ? ?For questions related to your Healthy [facility] health plan, please call: [phone] or visit the homepage here: [email or web address] ? ?If you would like to schedule transportation through your Healthy [facility] plan, please call the following number at least 2 days in advance of your appointment: [phone] ? For information about your ride after you set it up, call Ride Assist at [phone]. Use this number to activate a Will Call pickup, or if your transportation is late for a scheduled pickup. Use this number, too, if you need to make a change or cancel a previously scheduled reservation. ? If you need transportation services right away, call [phone]. The after-hours call center is staffed 24 hours to handle ride assistance and urgent reservation requests (including discharges) 365 days a year. Urgent trips include sick visits, hospital discharge requests and life-sustaining treatment. ? ?Call the [place] at [phone], at any time, 24 hours a day, 7 days a week. If you are in danger or need immediate medical attention call 911. ? ?If you would like help to quit smoking, call 1-800-QUIT-NOW (631)238-1576) OR Espa?ol: 1-855-D?jelo-Ya 312-522-8925) o para m?s informaci?n haga clic aqu? or Text READY to 200-400 to register via text ? ?Chris [name], ? ? ?Please see education materials related to constipation and  diet provided as print materials.  ? ?The patient verbalized understanding of instructions, educational materials, and care plan provided today and agreed to receive a mailed copy of patient instructions, educational materials, and care plan.  ? ?Telephone follow up appointment with Managed Medicaid care management team member scheduled for:10/12/21 @ 11:15am ? ?[name] RN, BSN ?[place] ?RN Care Coordinator ? ? ?Following is a copy of your plan of care:  ?Care Plan : [place] of Care  ?Updates made by [name], RN since 09/11/2021 12:00 AM  ?  ? ?Problem: Knowledge Deficits and Care Coordination Needs related to long term management of CHF   ?Priority: High  ?  ? ?Long-Range Goal: Development of Plan of Care to address Care Coordination Needs and Knowledge Deficits related to CHF   ?Start Date: 04/28/2021  ?Expected End Date: 10/12/2021  ?Priority: High  ?Note:   ?Current Barriers:  ?Knowledge Deficits related to plan of care for management of CHF  ?Care Coordination needs related to Transportation  ?Non-adherence to prescribed medication regimen ?Difficulty obtaining medications ?Chris [name] attended recent appointments with PCP, Cardiology and Gastroenterology. He missed appointment for renal ultrasound due to transportation issue and needs to reschedule. He has not scheduled diabetic eye exam. He is requesting assistance affording utilities, reports his sister took money that should have been paying his utilities, now he is behind $600. ? ?RNCM Clinical Goal(s):  ?Patient will verbalize understanding of plan for management of CHF as evidenced by patient verbalization of plan ?take all medications exactly as prescribed and will call provider for medication related questions as evidenced by physician notes in EMR    ?attend all  scheduled medical appointments: follow up on referrals placed by PCP as evidenced by physician notes in EMR        ?demonstrate improved  adherence to prescribed treatment plan for CHF as evidenced by attending Cardiology 07/10/21 at 9:25am ?work with pharmacist to address Medication procurement related to CHF as evidenced by review of EMR and patient or pharmacist report    through collaboration with RN Care manager, provider, and care team.  ? ?Interventions: ?Inter-disciplinary care team collaboration (see longitudinal plan of care) ?Evaluation of current treatment plan related to  self management and patient's adherence to plan as established by provider ?Advised patient to take all medications with him to all appointments ?Attempted to assist in rescheduling renal ultrasound-unable to schedule, the order was cancelled ?Collaborate with PCP for new order for renal ultrasound ?Provided [facility] [phone] ?Provided patient with information for medical transportation provided by [facility] [phone] ?Collaborate with [name] for resources to assist with utilities-appointment scheduled for 09/15/21 @ 3pm ?Reviewed notes with patient from Gastroenterology visit ? ? ? ?COPD Interventions:  (Status:  Goal on track:  Yes.) Long Term Goal ?Advised patient to track and manage COPD triggers ?Provided education about and advised patient to utilize infection prevention strategies to reduce risk of respiratory infection ?Discussed the importance of adequate rest and management of fatigue with COPD ?Assessed social determinant of health barriers ? ? ?Heart Failure Interventions:  (Status: Goal on Track (progressing): YES.)  Long Term Goal  ?Provided education on low sodium diet ?Reviewed role of diuretics in prevention of fluid overload and management of heart failure ?Discussed the importance of keeping all appointments with provider ?Provided patient with education about the role of exercise in the management of heart failure ?Assessed social determinant of health barriers ? ? ?Patient Goals/Self-Care Activities: ?Patient will self administer  medications as prescribed as evidenced by self report/primary caregiver report  ?Patient will attend all scheduled provider appointments as evidenced by clinician review of documented attendance to scheduled appointments and patient/caregiver report ?Patient will attend church or other social activities as evidenced by patient report ?Patient will continue to perform ADL's independently as evidenced by patient/caregiver report ?Patient will continue to perform IADL's independently as evidenced by patient/caregiver report ?Patient will call provider office for new concerns or questions as evidenced by review of documented incoming telephone call notes and patient report ?Patient will work with [facility] Pharmacist for medication management as evidenced by notes in EMR ?call office if I gain more than 2 pounds in one day or 5 pounds in one week ?use salt in moderation ?watch for swelling in feet, ankles and legs every day ?weigh myself daily ?know when to call the doctor  ? ? ?  ? ?  ?

## 2021-09-11 NOTE — Patient Outreach (Signed)
?Medicaid Managed Care   ?Nurse Care Manager Note ? ?09/11/2021 ?Name:  Chris [name] MRN:  [id number] DOB:  12-14-1960 ? ?Chris [name] is an 61 y.o. year old male who is a primary patient of [name], MD.  The [facility] Managed Care Coordination team was consulted for assistance with:    ?CHF ?COPD ? ?Chris [name] was given information about Medicaid Managed Care Coordination team services today. Chris [name] Patient agreed to services and verbal consent obtained. ? ?Engaged with patient by telephone for follow up visit in response to provider referral for case management and/or care coordination services.  ? ?Assessments/Interventions:  Review of past medical history, allergies, medications, health status, including review of consultants reports, laboratory and other test data, was performed as part of comprehensive evaluation and provision of chronic care management services. ? ?SDOH (Social Determinants of Health) assessments and interventions performed: ?SDOH Interventions   ? ?Flowsheet Row Most Recent Value  ?SDOH Interventions   ?Physical Activity Interventions Intervention Not Indicated  ?Transportation Interventions Other (Comment)  [Discussed and provided information for medical transportation provided by [employer]]  ? ?  ? ? ?Care Plan ? ?No Known Allergies ? ?Medications Reviewed Today   ? ? Reviewed by [name], RN (Registered Nurse) on 09/11/21 at 1345  Med List Status: <None>  ? ?Medication Order Taking? Sig Documenting Provider Last Dose Status Informant  ?Accu-Chek Softclix Lancets lancets [id number] No Use as instructed  ?Patient not taking: Reported on 09/11/2021  ? [name], MD Not Taking Active Self  ?albuterol (PROVENTIL) (2.5 MG/3ML) 0.083% nebulizer solution [id number] Yes TAKE 3 MLS BY NEBULIZATION EVERY 6 (SIX) HOURS AS NEEDED FOR SHORTNESS OF BREATH. [name], MD Taking Active Self  ?         ?Med Note [name] Apr 27, 2021  1:29 PM)     ?albuterol (VENTOLIN HFA) 108 (90 Base) MCG/ACT inhaler [id number] Yes Inhale 2 puffs into the lungs every 6 (six) hours as needed for wheezing or shortness of breath. [name], MD Taking Active   ?allopurinol (ZYLOPRIM) 100 MG tablet [id number] Yes TAKE 2 TABLETS (200 MG TOTAL) BY MOUTH DAILY. [name], MD Taking Active Self  ?         ?Med Note [name] Apr 27, 2021  1:29 PM)    ?apixaban (ELIQUIS) 5 MG TABS tablet [id number] Yes TAKE 1 TABLET (5 MG TOTAL) BY MOUTH 2 (TWO) TIMES DAILY. [name], MD Taking Active Self  ?         ?Med Note [name] Apr 27, 2021  1:26 PM)    ?aspirin 81 MG EC tablet [id number] Yes Take 1 tablet (81 mg total) by mouth daily. [name], MD Taking Active Self  ?         ?Med Note [name] Apr 27, 2021  1:27 PM)    ?atorvastatin (LIPITOR) 40 MG tablet [id number] Yes Take 1 tablet (40 mg total) by mouth daily. [name], MD Taking Active Self  ?         ?Med Note [name] Apr 27, 2021  1:29 PM)    ?Blood Glucose Monitoring Suppl (ACCU-CHEK GUIDE) w/Device KIT [id number] No Use as directed  ?Patient not taking: Reported on 09/11/2021  ? [name], MD Not Taking Active Self  ?budesonide-formoterol (  SYMBICORT) 160-4.5 MCG/ACT inhaler [id number] Yes Inhale 2 puffs into the lungs 2 (two) times daily. [name], MD Taking Active Self  ?         ?Med Note [name] Apr 27, 2021  1:29 PM)    ?carvedilol (COREG) 6.25 MG tablet [id number] Yes Take 1 tablet (6.25 mg total) by mouth 2 (two) times daily. Alisa Graff, FNP Taking Active Self  ?         ?Med Note [name] Apr 27, 2021  1:29 PM)    ?colchicine 0.6 MG tablet [id number] Yes Take 2 tabs (1.2 mg) at the onset of a gout flare, may repeat 1 tab (0.6 mg) after 2 hours if symptoms persist. [name], MD Taking Active   ?dapagliflozin propanediol (FARXIGA) 10 MG TABS tablet [id number] Yes Take 1  tablet (10 mg total) by mouth daily. Alisa Graff, FNP Taking Active Self  ?         ?Med Note [name] Apr 27, 2021  1:29 PM)    ?glipiZIDE (GLUCOTROL) 5 MG tablet [id number] Yes Take 0.5 tablets (2.5 mg total) by mouth daily before breakfast. [name], MD Taking Active   ?glucose blood (ACCU-CHEK GUIDE) test strip [id number] No Use as directed to check blood sugar 1-2 times a day  ?Patient not taking: Reported on 09/11/2021  ? [name], MD Not Taking Active Self  ?isosorbide-hydrALAZINE (BIDIL) 20-37.5 MG tablet [id number] Yes Take 1 tablet by mouth 3 (three) times daily. [name], MD Taking Active   ?  Discontinued 07/11/20 0943 (Discontinued by provider)   ?montelukast (SINGULAIR) 10 MG tablet [id number] Yes TAKE 1 TABLET (10 MG TOTAL) BY MOUTH AT BEDTIME. [name], MD Taking Active   ?sennosides-docusate sodium (SENOKOT-S) 8.6-50 MG tablet [id number] No Take 1 tablet by mouth daily as needed for constipation.  ?Patient not taking: Reported on 09/11/2021  ? [name], MD Not Taking Active   ?torsemide (DEMADEX) 20 MG tablet [id number] Yes Take 2 tablets (40 mg total) by mouth daily. [name], MD Taking Active   ? ?  ?  ? ?  ? ? ?Patient Active Problem List  ? Diagnosis Date Noted  ? Right kidney mass 05/14/2021  ? CHF exacerbation ([place]) 04/14/2021  ? Chest pain 04/14/2021  ? Syncope 04/14/2021  ? Left-sided weakness 10/28/2020  ? COPD exacerbation ([place])   ? Typical atrial flutter ([place])   ? CHF (congestive heart failure) ([place]) 07/04/2020  ? Acute exacerbation of CHF (congestive heart failure) ([place]) 06/16/2020  ? COPD with acute exacerbation ([place]) 06/16/2020  ? Influenza vaccine refused 05/06/2020  ? Acute on chronic combined systolic (congestive) and diastolic (congestive) heart failure ([place]) 05/05/2020  ? Acute decompensated heart failure ([place]) 05/04/2020  ? Illiteracy 05/04/2020  ? Type 2 diabetes mellitus with stage 3 chronic kidney disease  ([place]) 12/25/2019  ? Acute on chronic combined systolic and diastolic CHF (congestive heart failure) ([place]) 10/26/2019  ? Elevated troponin I level 10/26/2019  ? Acute on chronic diastolic (congestive) heart failure ([place]) 10/26/2019  ? History of gout 02/01/2019  ? Seasonal allergic rhinitis due to pollen 02/01/2019  ? Tobacco dependence 11/30/2018  ? Microscopic hematuria 11/30/2018  ? Depression 11/30/2018  ? Difficulty controlling anger 11/30/2018  ? CAP (community acquired pneumonia) 08/11/2018  ? COPD (chronic obstructive pulmonary disease) ([place])   ? CKD (chronic kidney disease) stage 3,  GFR 30-59 ml/min ([place]) 08/10/2018  ? Recurrent epistaxis 04/21/2018  ? Mixed hyperlipidemia 07/28/2017  ? Essential hypertension 07/28/2017  ? Chronic systolic heart failure ([place]) 10/25/2014  ? Cocaine abuse ([place]) 02/20/2013  ? Cannabis abuse 02/20/2013  ? Back pain, chronic 02/20/2013  ? ? ?Conditions to be addressed/monitored per PCP order:  CHF and COPD ? ?Care Plan : RN Care Manager Plan of Care  ?Updates made by [name], RN since 09/11/2021 12:00 AM  ?  ? ?Problem: Knowledge Deficits and Care Coordination Needs related to long term management of CHF   ?Priority: High  ?  ? ?Long-Range Goal: Development of Plan of Care to address Care Coordination Needs and Knowledge Deficits related to CHF   ?Start Date: 04/28/2021  ?Expected End Date: 10/12/2021  ?Priority: High  ?Note:   ?Current Barriers:  ?Knowledge Deficits related to plan of care for management of CHF  ?Care Coordination needs related to Transportation  ?Non-adherence to prescribed medication regimen ?Difficulty obtaining medications ?Chris [name] attended recent appointments with PCP, Cardiology and Gastroenterology. He missed appointment for renal ultrasound due to transportation issue and needs to reschedule. He has not scheduled diabetic eye exam. He is requesting assistance affording utilities, reports his sister took money that should have been paying his  utilities, now he is behind $600. ? ?RNCM Clinical Goal(s):  ?Patient will verbalize understanding of plan for management of CHF as evidenced by patient verbalization of plan ?take all medications exactly

## 2021-09-14 ENCOUNTER — Telehealth: Payer: Self-pay | Admitting: Internal Medicine

## 2021-09-14 ENCOUNTER — Other Ambulatory Visit: Payer: Medicaid Other

## 2021-09-14 DIAGNOSIS — K5900 Constipation, unspecified: Secondary | ICD-10-CM

## 2021-09-14 DIAGNOSIS — R1084 Generalized abdominal pain: Secondary | ICD-10-CM | POA: Diagnosis not present

## 2021-09-14 DIAGNOSIS — Z1211 Encounter for screening for malignant neoplasm of colon: Secondary | ICD-10-CM | POA: Diagnosis not present

## 2021-09-14 DIAGNOSIS — Z1212 Encounter for screening for malignant neoplasm of rectum: Secondary | ICD-10-CM | POA: Diagnosis not present

## 2021-09-14 NOTE — Telephone Encounter (Signed)
Returned pt call and made aware that GI will call and schedule pt states he doesn't have any questions or concerns  ?

## 2021-09-14 NOTE — Telephone Encounter (Signed)
Copied from [place] [id number]. Topic: Appointment Scheduling - Scheduling Inquiry for Clinic >> Sep 10, 2021 11:27 AM Greggory Keen D wrote: Reason for CRM: Pt called saying he thought he had an appt today for an [place] in [place] but I did not see one.  He said he thought Dr. Wynetta Emery had made one for him today at 3:30

## 2021-09-15 ENCOUNTER — Other Ambulatory Visit (INDEPENDENT_AMBULATORY_CARE_PROVIDER_SITE_OTHER): Payer: Medicaid Other

## 2021-09-15 ENCOUNTER — Other Ambulatory Visit: Payer: Self-pay

## 2021-09-15 DIAGNOSIS — Z1212 Encounter for screening for malignant neoplasm of rectum: Secondary | ICD-10-CM

## 2021-09-15 DIAGNOSIS — Z1211 Encounter for screening for malignant neoplasm of colon: Secondary | ICD-10-CM

## 2021-09-15 DIAGNOSIS — R1084 Generalized abdominal pain: Secondary | ICD-10-CM | POA: Diagnosis not present

## 2021-09-15 DIAGNOSIS — K5900 Constipation, unspecified: Secondary | ICD-10-CM

## 2021-09-15 LAB — FECAL OCCULT BLOOD, IMMUNOCHEMICAL: Fecal Occult Bld: POSITIVE — AB

## 2021-09-15 NOTE — Patient Outreach (Signed)
?Medicaid Managed Care ?Social Work Note ? ?09/15/2021 ?Name:  Chris [name] MRN:  [id number] DOB:  03/12/1961 ? ?Chris [name] is an 61 y.o. year old male who is a primary patient of [name], MD.  The Medicaid Managed Care Coordination team was consulted for assistance with:  Community Resources  ? ?Chris [name] was given information about Medicaid Managed Care Coordination team services today. [name] Patient agreed to services and verbal consent obtained. ? ?Engaged with patient  for by telephone forfollow up visit in response to referral for case management and/or care coordination services.  ? ?Assessments/Interventions:  Review of past medical history, allergies, medications, health status, including review of consultants reports, laboratory and other test data, was performed as part of comprehensive evaluation and provision of chronic care management services. ? ?SDOH: (Social Determinant of Health) assessments and interventions performed: ?[name] completed telephone outreach with patient for utility assistance. Patient stated that he has a cut off notice for 09/25/21 and his bill is $700.00. [name] informed patient he can go to the [facility] and can also [employer] [name] provided patient with the telephone number for [employer]. Patient stated no other resources are needed at this time.  ? ?Advanced Directives Status:  Not addressed in this encounter. ? ?Care Plan ?                ?No Known Allergies ? ?Medications Reviewed Today   ? ? Reviewed by [name], RN (Registered Nurse) on 09/11/21 at 1345  Med List Status: <None>  ? ?Medication Order Taking? Sig Documenting Provider Last Dose Status Informant  ?Accu-Chek Softclix Lancets lancets [id number] No Use as instructed  ?Patient not taking: Reported on 09/11/2021  ? [name], MD Not Taking Active Self  ?albuterol (PROVENTIL) (2.5 MG/3ML) 0.083% nebulizer solution [id number] Yes TAKE 3  MLS BY NEBULIZATION EVERY 6 (SIX) HOURS AS NEEDED FOR SHORTNESS OF BREATH. [name], MD Taking Active Self  ?         ?Med Note [name] Apr 27, 2021  1:29 PM)    ?albuterol (VENTOLIN HFA) 108 (90 Base) MCG/ACT inhaler [id number] Yes Inhale 2 puffs into the lungs every 6 (six) hours as needed for wheezing or shortness of breath. [name], MD Taking Active   ?allopurinol (ZYLOPRIM) 100 MG tablet [id number] Yes TAKE 2 TABLETS (200 MG TOTAL) BY MOUTH DAILY. [name], MD Taking Active Self  ?         ?Med Note [name] Apr 27, 2021  1:29 PM)    ?apixaban (ELIQUIS) 5 MG TABS tablet [id number] Yes TAKE 1 TABLET (5 MG TOTAL) BY MOUTH 2 (TWO) TIMES DAILY. [name], MD Taking Active Self  ?         ?Med Note [name] Apr 27, 2021  1:26 PM)    ?aspirin 81 MG EC tablet [id number] Yes Take 1 tablet (81 mg total) by mouth daily. [name], MD Taking Active Self  ?         ?Med Note [name] Apr 27, 2021  1:27 PM)    ?atorvastatin (LIPITOR) 40 MG tablet [id number] Yes Take 1 tablet (40 mg total) by mouth daily. [name], MD Taking Active Self  ?         ?Med Note (STRAW, AMANDA H  Mon Apr 27, 2021  1:29 PM)    ?Blood Glucose Monitoring Suppl (ACCU-CHEK GUIDE) w/Device KIT [id number] No Use as directed  ?Patient not taking: Reported on 09/11/2021  ? [name], MD Not Taking Active Self  ?budesonide-formoterol [facility]) 160-4.5 MCG/ACT inhaler [id number] Yes Inhale 2 puffs into the lungs 2 (two) times daily. [name], MD Taking Active Self  ?         ?Med Note [name] Apr 27, 2021  1:29 PM)    ?carvedilol (COREG) 6.25 MG tablet [id number] Yes Take 1 tablet (6.25 mg total) by mouth 2 (two) times daily. Alisa Graff, FNP Taking Active Self  ?         ?Med Note [name] Apr 27, 2021  1:29 PM)    ?colchicine 0.6 MG tablet [id number] Yes Take 2 tabs (1.2 mg) at the onset of a gout  flare, may repeat 1 tab (0.6 mg) after 2 hours if symptoms persist. [name], MD Taking Active   ?dapagliflozin propanediol (FARXIGA) 10 MG TABS tablet [id number] Yes Take 1 tablet (10 mg total) by mouth daily. Alisa Graff, FNP Taking Active Self  ?         ?Med Note [name] Apr 27, 2021  1:29 PM)    ?glipiZIDE (GLUCOTROL) 5 MG tablet [id number] Yes Take 0.5 tablets (2.5 mg total) by mouth daily before breakfast. [name], MD Taking Active   ?glucose blood (ACCU-CHEK GUIDE) test strip [id number] No Use as directed to check blood sugar 1-2 times a day  ?Patient not taking: Reported on 09/11/2021  ? [name], MD Not Taking Active Self  ?isosorbide-hydrALAZINE (BIDIL) 20-37.5 MG tablet [id number] Yes Take 1 tablet by mouth 3 (three) times daily. [name], MD Taking Active   ?  Discontinued 07/11/20 0943 (Discontinued by provider)   ?montelukast (SINGULAIR) 10 MG tablet [id number] Yes TAKE 1 TABLET (10 MG TOTAL) BY MOUTH AT BEDTIME. [name], MD Taking Active   ?sennosides-docusate sodium (SENOKOT-S) 8.6-50 MG tablet [id number] No Take 1 tablet by mouth daily as needed for constipation.  ?Patient not taking: Reported on 09/11/2021  ? [name], MD Not Taking Active   ?torsemide (DEMADEX) 20 MG tablet [id number] Yes Take 2 tablets (40 mg total) by mouth daily. [name], MD Taking Active   ? ?  ?  ? ?  ? ? ?Patient Active Problem List  ? Diagnosis Date Noted  ? Right kidney mass 05/14/2021  ? CHF exacerbation ([place]) 04/14/2021  ? Chest pain 04/14/2021  ? Syncope 04/14/2021  ? Left-sided weakness 10/28/2020  ? COPD exacerbation ([place])   ? Typical atrial flutter ([place])   ? CHF (congestive heart failure) ([place]) 07/04/2020  ? Acute exacerbation of CHF (congestive heart failure) ([place]) 06/16/2020  ? COPD with acute exacerbation ([place]) 06/16/2020  ? Influenza vaccine refused 05/06/2020  ? Acute on chronic combined systolic (congestive) and diastolic  (congestive) heart failure ([place]) 05/05/2020  ? Acute decompensated heart failure ([place]) 05/04/2020  ? Illiteracy 05/04/2020  ? Type 2 diabetes mellitus with stage 3 chronic kidney disease ([place]) 12/25/2019  ? Acute on chronic combined systolic and diastolic CHF (congestive heart failure) ([place]) 10/26/2019  ? Elevated troponin I level 10/26/2019  ? Acute on chronic diastolic (congestive) heart failure ([place]) 10/26/2019  ? History of gout 02/01/2019  ? Seasonal allergic rhinitis due to pollen 02/01/2019  ?  Tobacco dependence 11/30/2018  ? Microscopic hematuria 11/30/2018  ? Depression 11/30/2018  ? Difficulty controlling anger 11/30/2018  ? CAP (community acquired pneumonia) 08/11/2018  ? COPD (chronic obstructive pulmonary disease) ([place])   ? CKD (chronic kidney disease) stage 3, GFR 30-59 ml/min ([facility]) 08/10/2018  ? Recurrent epistaxis 04/21/2018  ? Mixed hyperlipidemia 07/28/2017  ? Essential hypertension 07/28/2017  ? Chronic systolic heart failure ([place]) 10/25/2014  ? Cocaine abuse ([place]) 02/20/2013  ? Cannabis abuse 02/20/2013  ? Back pain, chronic 02/20/2013  ? ? ?Conditions to be addressed/monitored per PCP order:   utility assistance ? ?Care Plan : General Social Work (Adult)  ?Updates made by Ethelda Chick since 09/15/2021 12:00 AM  ?  ? ?Problem: Depression Identification (Depression)   ?  ? ?Long-Range Goal: To increase my self-care and coping skills   ?Start Date: 10/07/2020  ?Priority: High  ?Note:   ?Timeframe:  Long-Range Goal ?Priority:  High ?Start Date:    10/07/20                        ?Expected End Date:ongoing   ? ?Follow Up Date- 04/06/21 ?  ?- begin personal counseling ?- call and visit an old friend ?- check out volunteer opportunities ?- join a support group ?- laugh; watch a funny movie or comedian ?- learn and use visualization or guided imagery ?- perform a random act of kindness ?- practice relaxation or meditation daily ?- start or continue a personal journal ?- talk about feelings with a  friend, family or spiritual advisor ?- practice positive thinking and self-talk  ?  ?Why is this important?   ?When you are stressed, down or upset, your body reacts too.  ?For example, your blood pressure may

## 2021-09-15 NOTE — Patient Instructions (Signed)
Visit Information ? ?Chris [name] was given information about Medicaid Managed Care team care coordination services as a part of their Healthy Blue Medicaid benefit. Chris [name] verbally consented to engagement with the [facility] Managed Care team.  ? ?If you are experiencing a medical emergency, please call 911 or report to your local emergency department or urgent care.  ? ?If you have a non-emergency medical problem during routine business hours, please contact your provider's office and ask to speak with a nurse.  ? ?For questions related to your Healthy [facility] health plan, please call: [phone] or visit the homepage here: [email or web address] ? ?If you would like to schedule transportation through your Healthy [facility] plan, please call the following number at least 2 days in advance of your appointment: [phone] ? For information about your ride after you set it up, call Ride Assist at [phone]. Use this number to activate a Will Call pickup, or if your transportation is late for a scheduled pickup. Use this number, too, if you need to make a change or cancel a previously scheduled reservation. ? If you need transportation services right away, call [phone]. The after-hours call center is staffed 24 hours to handle ride assistance and urgent reservation requests (including discharges) 365 days a year. Urgent trips include sick visits, hospital discharge requests and life-sustaining treatment. ? ?Call the [place] at [phone], at any time, 24 hours a day, 7 days a week. If you are in danger or need immediate medical attention call 911. ? ?If you would like help to quit smoking, call 1-800-QUIT-NOW (305) 379-5631) OR Espa?ol: 1-855-D?jelo-Ya 878-768-3768) o para m?s informaci?n haga clic aqu? or Text READY to 200-400 to register via text ? ?Chris [name] - following are the goals we discussed in your visit today:  ?  Goals Addressed   ?None ?  ? ? ?Social Worker will follow up with patient in 7 days .  ? ?[name], [name], [place] ?[place]  ?High Risk Managed Medicaid Team  ?[phone]  ? ?Following is a copy of your plan of care:  ?Care Plan : General Social Work (Adult)  ?Updates made by Ethelda Chick since 09/15/2021 12:00 AM  ?  ? ?Problem: Depression Identification (Depression)   ?  ? ?Long-Range Goal: To increase my self-care and coping skills   ?Start Date: 10/07/2020  ?Priority: High  ?Note:   ?Timeframe:  Long-Range Goal ?Priority:  High ?Start Date:    10/07/20                        ?Expected End Date:ongoing   ? ?Follow Up Date- 04/06/21 ?  ?- begin personal counseling ?- call and visit an old friend ?- check out volunteer opportunities ?- join a support group ?- laugh; watch a funny movie or comedian ?- learn and use visualization or guided imagery ?- perform a random act of kindness ?- practice relaxation or meditation daily ?- start or continue a personal journal ?- talk about feelings with a friend, family or spiritual advisor ?- practice positive thinking and self-talk  ?  ?Why is this important?   ?When you are stressed, down or upset, your body reacts too.  ?For example, your blood pressure may get higher; you may have a headache or stomachache.  ?When your emotions get the best of you, your body's ability to fight off cold and flu gets weak.  ?These steps will help you  manage your emotions.  ? ?Current Barriers:  ?Chronic Mental Health needs related to depression and need for housing ?Limited social support, Housing barriers, and Mental Health Concerns  ?Social Isolation ?ADL IADL limitations ?Suicidal Ideation/Homicidal Ideation: No ? ?Clinical Social Work Goal(s):  ?Over the next 120 days, patient will work with SW monthly by telephone or in person to reduce or manage symptoms related to depression ?patient will work with [name] to address needs related to finding stable  housing but patient was made aware that housing resources are very limited at this time ? ?Interventions: ?Patient interviewed and appropriate assessments performed: brief mental health assessment ?PHQ 2 ?PHQ 9 ?SDOH Interventions   ? ?Social Determinants of Health with Concerns  ? ?Tobacco Use: High Risk  ? Smoking Tobacco Use: Every Day  ? Smokeless Tobacco Use: Never  ?Financial Resource Strain: Not on file  ?Food Insecurity: Food Insecurity Present  ? Worried About Charity fundraiser in the Last Year: Often true  ? Ran Out of Food in the Last Year: Often true  ?Transportation Needs: Unmet Transportation Needs  ? Lack of Transportation (Medical): Yes  ? Lack of Transportation (Non-Medical): Yes  ?Physical Activity: Not on file  ?Stress: Not on file  ?Social Connections: Not on file  ?Intimate Partner Violence: Not on file  ?Alcohol Screen: Not on file  ?Housing: Not on file  ? ? ?  ? ?Patient interviewed and appropriate assessments performed ?Discussed plans with patient for ongoing care management follow up and provided patient with direct contact information for care management team ?Assisted patient/caregiver with obtaining information about health plan benefits ?Patient was educated on the Advance Directives process. Patient was encouraged to complete document with PCP.  ?Advance Directive education was provided to patient on 03/11/21. ?Suicide Risk Assessment completed on 03/11/21. ?Encouraged patient to consider a mental health provider for long term follow up and therapy/counseling but patient declined. Patient reports that does not need a psychiatry referral at this time. He reports that his main source of depression is from not being able to find a place on his own. He is currently stable and residing with his brother and his brother's family in their home. Patient reports that his brother leaves for work at 3 pm during the week and gets back home around 1 am. [employer] number provided  to patient. Patient reports that his sister has found him a house to relocate in. This house is inherited from the family and will need some additional work done. Patient reports that this house has mold in it but he rather reside there instead of living with his brother. Patient is currently stable and reports no urgent concerns at this time. UPDATE- Patient is interested in counseling and medication management at [facility]. Referral made on 03/11/21 to them.  ?[facility] LCSW completed care coordination with Care Guide on 01/07/21 as patient never received food delivery that was set up. Update- new referral made for Care Guide for food support on 03/11/21. ?Patient reports recent agitation due to his inability to secure housing on this own. [facility] LCSW provided emotional support and brief housing resource education. Patient is currently not on any public housing wait list.   ?Solution-Focused Strategies, Mindfulness or Relaxation Training, Active listening / Reflection utilized , Emotional Supportive Provided, and Verbalization of feelings encouraged  ?Patient declines any current substance use. He reports that his last use of cocaine was over 6 months ago. ?Patient reports that he has developed social anxiety. He admits  that he gets irritable at times and will snap at his loved ones and then will experience guilt afterwards. Educated patient on coping methods to implement into his daily life to combat anxiety symptoms and stress. Patient denied any current suicidal or homicidal ideations. Encouraged patient to implement deep breathing and grounding exercises into his daily routine due to ongoing anxiety and SOB.  ?Update 01/07/21-Patient is not agreeable to mental health support or any additional referrals. [facility] LCSW placed referral for counseling and medication management through the [facility] in the past but patient declined their calls and eventually refused services.  ?Patient reports implementing appropriate self-care  habits into his daily routine such as: drinking water, staying active around the house, taking his medications as prescribed, combating negative thoughts or emotions and staying connected with his support networ

## 2021-09-17 LAB — H. PYLORI ANTIGEN, STOOL: H pylori Ag, Stl: NEGATIVE

## 2021-09-17 NOTE — Progress Notes (Signed)
Mr. Perkey,  ?Your stool test was positive for occult blood.  This means we will need to proceed with a colonoscopy.  As discussed, because of your heart failure (EF35%) your colonoscopy will need to be performed in the hospital setting.  Due to a current backlog, it will likely be several months before we are able to do your procedure. ? ?[place],  ?Please add Mr. Lennox to the list of patients needing a hospital colonoscopy (positive FOBT)

## 2021-09-18 NOTE — Progress Notes (Signed)
Mr. Drummonds,  ?Your H. Pylori test was negative.  If your abdominal pain doesn't improve with the laxative therapy discussed, I would recommend a trial of omeprazole 20 mg once a day for 4 weeks.  Please let us know how your pain is doing in the next 1-2 weeks so we can prescribe the omeprazole if needed.

## 2021-09-23 NOTE — Telephone Encounter (Signed)
Returned pt call pt states he is wanting his labs from the GI doctor. Made pt aware that he will need to contact the GI office to get those results. Pt states he understands and doesn't have any questions or concerns  ?

## 2021-09-23 NOTE — Telephone Encounter (Signed)
Copied from [place] [id number]. Topic: General - Other >> Sep 23, 2021  9:37 AM Pawlus, Brayton Layman A wrote: Reason for CRM: Pt wanted to go over his latest lab results, please advise.

## 2021-09-24 ENCOUNTER — Other Ambulatory Visit: Payer: Self-pay

## 2021-09-24 NOTE — Patient Instructions (Signed)
Visit Information ? ?[name]  - as a part of your Medicaid benefit, you are eligible for care management and care coordination services at no cost or copay. I was unable to reach you by phone today but would be happy to help you with your health related needs. Please feel free to call me @ [phone]  ? ?A member of the Managed Medicaid care management team will reach out to you again over the next 30 days.  ? ?[name], [name], MHA ?[place]  ?High Risk Managed Medicaid Team  ?[phone]  ?

## 2021-09-24 NOTE — Patient Outreach (Signed)
Care Coordination ? ?09/24/2021 ? ?[name] ?Oct 11, 1960 ?[id number] ? ? ?Medicaid Managed Care  ? ?Unsuccessful Outreach Note ? ?09/24/2021 ?Name: ZYAD BOOMER MRN: [id number] DOB: 1960/08/27 ? ?Referred by: [name], MD ?Reason for referral : High Risk Managed Medicaid (Mm Social work unsuccessful telephone outreach) ? ? ?An unsuccessful telephone outreach was attempted today. The patient was referred to the case management team for assistance with care management and care coordination.  ? ?Follow Up Plan: The care management team will reach out to the patient again over the next 30 days.  ? ?[name], [name], MHA ?[place]  ?High Risk Managed Medicaid Team  ?[phone]   ?

## 2021-09-29 ENCOUNTER — Telehealth: Payer: Self-pay | Admitting: Internal Medicine

## 2021-09-29 NOTE — Telephone Encounter (Signed)
Pt called in for assistance from provider. Pt says that he need to provide [employer] with a letter for his oxygen tank and breathing machine.  ?  ?Pt says that he is on his way now to the office for pickup. Advised pt that provider is seeing pt's. Pt says that he has to provide the note to them today.  ?  ?  ?Please assist pt further.  ?

## 2021-09-29 NOTE — Telephone Encounter (Signed)
I met with the patient and he explained that his electricity has been turned off and he needs it back on because he has O2 and a nebulizer.  He has  contacted [employer] and they are assisting him with getting the electricity turned back on but they need a letter from his provider supporting  the need for O2 and neb use.   ?He needs to be back to [employer] with the letter by 1300 today.  ?

## 2021-09-29 NOTE — Telephone Encounter (Signed)
Contacted pt and lvm making pt aware that letter is ready for pick up  ?

## 2021-09-29 NOTE — Telephone Encounter (Signed)
noted 

## 2021-09-29 NOTE — Telephone Encounter (Signed)
Pt called in for assistance from provider. Pt says that he need to provide [employer] with a letter for his oxygen tank and breathing machine.  ? ?Pt says that he is on his way now to the office for pickup. Advised pt that provider is seeing pt's. Pt says that he has to provide the note to them today.  ? ? ?Please assist pt further.  ?

## 2021-09-29 NOTE — Telephone Encounter (Signed)
Letter is ready for pick-up.

## 2021-10-09 ENCOUNTER — Other Ambulatory Visit: Payer: Self-pay

## 2021-10-09 ENCOUNTER — Other Ambulatory Visit: Payer: Self-pay | Admitting: Internal Medicine

## 2021-10-09 DIAGNOSIS — M109 Gout, unspecified: Secondary | ICD-10-CM

## 2021-10-09 DIAGNOSIS — J439 Emphysema, unspecified: Secondary | ICD-10-CM

## 2021-10-09 MED ORDER — COLCHICINE 0.6 MG PO TABS
ORAL_TABLET | ORAL | 1 refills | Status: DC
  Filled 2021-10-09: qty 30, 15d supply, fill #0
  Filled 2022-01-01: qty 30, 15d supply, fill #1

## 2021-10-09 MED ORDER — ALBUTEROL SULFATE (2.5 MG/3ML) 0.083% IN NEBU
INHALATION_SOLUTION | RESPIRATORY_TRACT | 1 refills | Status: DC
  Filled 2021-10-09: qty 90, 22d supply, fill #0
  Filled 2021-12-30 (×2): qty 90, 22d supply, fill #1

## 2021-10-12 ENCOUNTER — Other Ambulatory Visit: Payer: Self-pay | Admitting: *Deleted

## 2021-10-12 NOTE — Patient Instructions (Signed)
Visit Information ? ?Mr. [name]  - as a part of your Medicaid benefit, you are eligible for care management and care coordination services at no cost or copay. I was unable to reach you by phone today but would be happy to help you with your health related needs. Please feel free to call me @ [phone].  ? ?A member of the Managed Medicaid care management team will reach out to you again over the next 14 days.  ? ?[name] RN, BSN ?[place] ?RN Care Coordinator ?  ?

## 2021-10-12 NOTE — Patient Outreach (Signed)
Care Coordination ? ?10/12/2021 ? ?[name] ?1960/08/04 ?[id number] ? ? ?Medicaid Managed Care  ? ?Unsuccessful Outreach Note ? ?10/12/2021 ?Name: ELDER DAVIDIAN MRN: [id number] DOB: Aug 27, 1960 ? ?Referred by: [name], MD ?Reason for referral : No chief complaint on file. ? ? ?An unsuccessful telephone outreach was attempted today. The patient was referred to the case management team for assistance with care management and care coordination.  ? ?Follow Up Plan: The care management team will reach out to the patient again over the next 14 days.  ? ?[name] RN, BSN ?[place] ?RN Care Coordinator ? ? ?

## 2021-10-13 ENCOUNTER — Other Ambulatory Visit: Payer: Self-pay | Admitting: *Deleted

## 2021-10-13 NOTE — Patient Instructions (Signed)
Visit Information ? ?Chris [name] was given information about Medicaid Managed Care team care coordination services as a part of their Healthy Blue Medicaid benefit. SHOJI PERTUIT verbally consented to engagement with the [facility] Managed Care team.  ? ?If you are experiencing a medical emergency, please call 911 or report to your local emergency department or urgent care.  ? ?If you have a non-emergency medical problem during routine business hours, please contact your provider's office and ask to speak with a nurse.  ? ?For questions related to your Healthy [facility] health plan, please call: [phone] or visit the homepage here: [email or web address] ? ?If you would like to schedule transportation through your Healthy [facility] plan, please call the following number at least 2 days in advance of your appointment: [phone] ? For information about your ride after you set it up, call Ride Assist at [phone]. Use this number to activate a Will Call pickup, or if your transportation is late for a scheduled pickup. Use this number, too, if you need to make a change or cancel a previously scheduled reservation. ? If you need transportation services right away, call [phone]. The after-hours call center is staffed 24 hours to handle ride assistance and urgent reservation requests (including discharges) 365 days a year. Urgent trips include sick visits, hospital discharge requests and life-sustaining treatment. ? ?Call the [place] at [phone], at any time, 24 hours a day, 7 days a week. If you are in danger or need immediate medical attention call 911. ? ?If you would like help to quit smoking, call 1-800-QUIT-NOW 315-727-1780) OR Espa?ol: 1-855-D?jelo-Ya 7853285878) o para m?s informaci?n haga clic aqu? or Text READY to 200-400 to register via text ? ?[name], ? ? ?Please see education materials related to Constipation and  Colonoscopy provided by MyChart link. and as [employer] account planner.  ? ?The patient verbalized understanding of instructions, educational materials, and care plan provided today and agreed to receive a mailed copy of patient instructions, educational materials, and care plan.  ? ?Telephone follow up appointment with Managed Medicaid care management team member scheduled for:11/16/21 @ 11:15am ? ?[name] RN, BSN ?[place] ?RN Care Coordinator ? ? ?Following is a copy of your plan of care:  ?Care Plan : [place] of Care  ?Updates made by [name], RN since 10/13/2021 12:00 AM  ?  ? ?Problem: Knowledge Deficits and Care Coordination Needs related to long term management of CHF   ?Priority: High  ?  ? ?Long-Range Goal: Development of Plan of Care to address Care Coordination Needs and Knowledge Deficits related to CHF   ?Start Date: 04/28/2021  ?Expected End Date: 12/11/2021  ?Priority: High  ?Note:   ?Current Barriers:  ?Knowledge Deficits related to plan of care for management of CHF  ?Care Coordination needs related to Transportation  ?Non-adherence to prescribed medication regimen ?Difficulty obtaining medications ?[name] has appointment with Cardiology on 10/15/21, he needs transportation to this appointment. He is concerned today about results from colon cancer screening being positive for occult blood. ? ?RNCM Clinical Goal(s):  ?Patient will verbalize understanding of plan for management of CHF as evidenced by patient verbalization of plan ?take all medications exactly as prescribed and will call provider for medication related questions as evidenced by physician notes in EMR    ?attend all scheduled medical appointments: Cardiology 10/15/21 as evidenced by physician notes in EMR        ?demonstrate improved  adherence to prescribed treatment plan for CHF as evidenced by attending Cardiology 10/15/21 @ 8:40am ?work with pharmacist to address Medication procurement related  to CHF as evidenced by review of EMR and patient or pharmacist report    through collaboration with RN Care manager, provider, and care team.  ? ?Interventions: ?Inter-disciplinary care team collaboration (see longitudinal plan of care) ?Evaluation of current treatment plan related to  self management and patient's adherence to plan as established by provider ?Advised patient to take all medications with him to all appointments ?Provided patient with information for medical transportation provided by [facility] [phone] ?Assisted with arranging transportation to Cardiology appointment on 10/15/21, conf [phone] ?Reviewed notes with patient from Gastroenterology visit-since +colon cancer screening, patient is now on list for Colonoscopy to be performed in the hospital ? ? ?GI issues  (Status:  New goal.)  Long Term Goal ?Evaluation of current treatment plan related to  GI issues ,  self-management and patient's adherence to plan as established by provider. ?Discussed plans with patient for ongoing care management follow up and provided patient with direct contact information for care management team ?Evaluation of current treatment plan related to GI and patient's adherence to plan as established by provider ?Provided education to patient re: GI ?Reviewed scheduled/upcoming provider appointments including ?Advised patient to discuss any abdominal changes with pain or bowel movements with provider ? ? ?COPD Interventions:  (Status:  Condition stable.  Not addressed this visit.) Long Term Goal ?Advised patient to track and manage COPD triggers ?Provided education about and advised patient to utilize infection prevention strategies to reduce risk of respiratory infection ?Discussed the importance of adequate rest and management of fatigue with COPD ?Assessed social determinant of health barriers ? ? ?Heart Failure Interventions:  (Status: Condition stable. Not addressed this visit.)  Long Term Goal  ?Provided  education on low sodium diet ?Reviewed role of diuretics in prevention of fluid overload and management of heart failure ?Discussed the importance of keeping all appointments with provider ?Provided patient with education about the role of exercise in the management of heart failure ?Assessed social determinant of health barriers ? ? ?Patient Goals/Self-Care Activities: ?Patient will self administer medications as prescribed as evidenced by self report/primary caregiver report  ?Patient will attend all scheduled provider appointments as evidenced by clinician review of documented attendance to scheduled appointments and patient/caregiver report ?Patient will attend church or other social activities as evidenced by patient report ?Patient will continue to perform ADL's independently as evidenced by patient/caregiver report ?Patient will continue to perform IADL's independently as evidenced by patient/caregiver report ?Patient will call provider office for new concerns or questions as evidenced by review of documented incoming telephone call notes and patient report ?Patient will work with [facility] Pharmacist for medication management as evidenced by notes in EMR ?call office if I gain more than 2 pounds in one day or 5 pounds in one week ?use salt in moderation ?watch for swelling in feet, ankles and legs every day ?weigh myself daily ?know when to call the doctor  ? ? ?  ? ?  ?

## 2021-10-13 NOTE — Patient Outreach (Signed)
?Medicaid Managed Care   ?Nurse Care Manager Note ? ?10/13/2021 ?Name:  Chris [name] MRN:  [id number] DOB:  [birth year] ? ?Chris [name] is an 61 y.o. year old male who is a primary patient of [name], MD.  The [facility] Managed Care Coordination team was consulted for assistance with:    ?CHF ?COPD ?GI issues ? ?Mr. Cardinal was given information about Medicaid Managed Care Coordination team services today. [name] Patient agreed to services and verbal consent obtained. ? ?Engaged with patient by telephone for follow up visit in response to provider referral for case management and/or care coordination services.  ? ?Assessments/Interventions:  Review of past medical history, allergies, medications, health status, including review of consultants reports, laboratory and other test data, was performed as part of comprehensive evaluation and provision of chronic care management services. ? ?SDOH (Social Determinants of Health) assessments and interventions performed: ?SDOH Interventions   ? ?Flowsheet Row Most Recent Value  ?SDOH Interventions   ?Transportation Interventions Other (Comment)  [Assisted with arranging transportation to Cardiology appointment via [employer] medical transportation [phone]  ? ?  ? ? ?Care Plan ? ?No Known Allergies ? ?Medications Reviewed Today   ? ? Reviewed by [name], RN (Registered Nurse) on 09/11/21 at 1345  Med List Status: <None>  ? ?Medication Order Taking? Sig Documenting Provider Last Dose Status Informant  ?Accu-Chek Softclix Lancets lancets [id number] No Use as instructed  ?Patient not taking: Reported on 09/11/2021  ? [name], MD Not Taking Active Self  ?albuterol (PROVENTIL) (2.5 MG/3ML) 0.083% nebulizer solution [id number] Yes TAKE 3 MLS BY NEBULIZATION EVERY 6 (SIX) HOURS AS NEEDED FOR SHORTNESS OF BREATH. [name], MD Taking Active Self  ?         ?Med Note [name] Apr 27, 2021  1:29 PM)    ?albuterol  (VENTOLIN HFA) 108 (90 Base) MCG/ACT inhaler [id number] Yes Inhale 2 puffs into the lungs every 6 (six) hours as needed for wheezing or shortness of breath. [name], MD Taking Active   ?allopurinol (ZYLOPRIM) 100 MG tablet [id number] Yes TAKE 2 TABLETS (200 MG TOTAL) BY MOUTH DAILY. [name], MD Taking Active Self  ?         ?Med Note [name] Apr 27, 2021  1:29 PM)    ?apixaban (ELIQUIS) 5 MG TABS tablet [id number] Yes TAKE 1 TABLET (5 MG TOTAL) BY MOUTH 2 (TWO) TIMES DAILY. [name], MD Taking Active Self  ?         ?Med Note [name] Apr 27, 2021  1:26 PM)    ?aspirin 81 MG EC tablet [id number] Yes Take 1 tablet (81 mg total) by mouth daily. [name], MD Taking Active Self  ?         ?Med Note [name] Apr 27, 2021  1:27 PM)    ?atorvastatin (LIPITOR) 40 MG tablet [id number] Yes Take 1 tablet (40 mg total) by mouth daily. [name], MD Taking Active Self  ?         ?Med Note [name] Apr 27, 2021  1:29 PM)    ?Blood Glucose Monitoring Suppl (ACCU-CHEK GUIDE) w/Device KIT [id number] No Use as directed  ?Patient not taking: Reported on 09/11/2021  ? [name], MD Not Taking Active Self  ?budesonide-formoterol (SYMBICORT) 160-4.5 MCG/ACT  inhaler [id number] Yes Inhale 2 puffs into the lungs 2 (two) times daily. [name], MD Taking Active Self  ?         ?Med Note [name] Apr 27, 2021  1:29 PM)    ?carvedilol (COREG) 6.25 MG tablet [id number] Yes Take 1 tablet (6.25 mg total) by mouth 2 (two) times daily. Alisa Graff, FNP Taking Active Self  ?         ?Med Note [name] Apr 27, 2021  1:29 PM)    ?colchicine 0.6 MG tablet [id number] Yes Take 2 tabs (1.2 mg) at the onset of a gout flare, may repeat 1 tab (0.6 mg) after 2 hours if symptoms persist. [name], MD Taking Active   ?dapagliflozin propanediol (FARXIGA) 10 MG TABS tablet [id number] Yes Take 1 tablet (10 mg  total) by mouth daily. Alisa Graff, FNP Taking Active Self  ?         ?Med Note [name] Apr 27, 2021  1:29 PM)    ?glipiZIDE (GLUCOTROL) 5 MG tablet [id number] Yes Take 0.5 tablets (2.5 mg total) by mouth daily before breakfast. [name], MD Taking Active   ?glucose blood (ACCU-CHEK GUIDE) test strip [id number] No Use as directed to check blood sugar 1-2 times a day  ?Patient not taking: Reported on 09/11/2021  ? [name], MD Not Taking Active Self  ?isosorbide-hydrALAZINE (BIDIL) 20-37.5 MG tablet [id number] Yes Take 1 tablet by mouth 3 (three) times daily. [name], MD Taking Active   ?  Discontinued 07/11/20 0943 (Discontinued by provider)   ?montelukast (SINGULAIR) 10 MG tablet [id number] Yes TAKE 1 TABLET (10 MG TOTAL) BY MOUTH AT BEDTIME. [name], MD Taking Active   ?sennosides-docusate sodium (SENOKOT-S) 8.6-50 MG tablet [id number] No Take 1 tablet by mouth daily as needed for constipation.  ?Patient not taking: Reported on 09/11/2021  ? [name], MD Not Taking Active   ?torsemide (DEMADEX) 20 MG tablet [id number] Yes Take 2 tablets (40 mg total) by mouth daily. [name], MD Taking Active   ? ?  ?  ? ?  ? ? ?Patient Active Problem List  ? Diagnosis Date Noted  ? Right kidney mass 05/14/2021  ? CHF exacerbation ([place]) 04/14/2021  ? Chest pain 04/14/2021  ? Syncope 04/14/2021  ? Left-sided weakness 10/28/2020  ? COPD exacerbation ([place])   ? Typical atrial flutter ([place])   ? CHF (congestive heart failure) ([place]) 07/04/2020  ? Acute exacerbation of CHF (congestive heart failure) ([place]) 06/16/2020  ? COPD with acute exacerbation ([place]) 06/16/2020  ? Influenza vaccine refused 05/06/2020  ? Acute on chronic combined systolic (congestive) and diastolic (congestive) heart failure ([place]) 05/05/2020  ? Acute decompensated heart failure ([place]) 05/04/2020  ? Illiteracy 05/04/2020  ? Type 2 diabetes mellitus with stage 3 chronic kidney disease ([place]) 12/25/2019   ? Acute on chronic combined systolic and diastolic CHF (congestive heart failure) ([place]) 10/26/2019  ? Elevated troponin I level 10/26/2019  ? Acute on chronic diastolic (congestive) heart failure ([place]) 10/26/2019  ? History of gout 02/01/2019  ? Seasonal allergic rhinitis due to pollen 02/01/2019  ? Tobacco dependence 11/30/2018  ? Microscopic hematuria 11/30/2018  ? Depression 11/30/2018  ? Difficulty controlling anger 11/30/2018  ? CAP (community acquired pneumonia) 08/11/2018  ? COPD (chronic obstructive pulmonary disease) ([place])   ? CKD (chronic kidney disease) stage 3, GFR 30-59 ml/min (  [place]) 08/10/2018  ? Recurrent epistaxis 04/21/2018  ? Mixed hyperlipidemia 07/28/2017  ? Essential hypertension 07/28/2017  ? Chronic systolic heart failure ([place]) 10/25/2014  ? Cocaine abuse ([place]) 02/20/2013  ? Cannabis abuse 02/20/2013  ? Back pain, chronic 02/20/2013  ? ? ?Conditions to be addressed/monitored per PCP order:  CHF, COPD, and GI issues ? ?Care Plan : RN Care Manager Plan of Care  ?Updates made by [name], RN since 10/13/2021 12:00 AM  ?  ? ?Problem: Knowledge Deficits and Care Coordination Needs related to long term management of CHF   ?Priority: High  ?  ? ?Long-Range Goal: Development of Plan of Care to address Care Coordination Needs and Knowledge Deficits related to CHF   ?Start Date: 04/28/2021  ?Expected End Date: 12/11/2021  ?Priority: High  ?Note:   ?Current Barriers:  ?Knowledge Deficits related to plan of care for management of CHF  ?Care Coordination needs related to Transportation  ?Non-adherence to prescribed medication regimen ?Difficulty obtaining medications ?Mr. Pickering has appointment with Cardiology on 10/15/21, he needs transportation to this appointment. He is concerned today about results from colon cancer screening being positive for occult blood. ? ?RNCM Clinical Goal(s):  ?Patient will verbalize understanding of plan for management of CHF as evidenced by patient verbalization of  plan ?take all medications exactly as prescribed and will call provider for medication related questions as evidenced by physician notes in EMR    ?attend all scheduled medical appointments: Cardiology 10/15/21 as

## 2021-10-15 ENCOUNTER — Other Ambulatory Visit: Payer: Self-pay

## 2021-10-15 ENCOUNTER — Ambulatory Visit (INDEPENDENT_AMBULATORY_CARE_PROVIDER_SITE_OTHER): Payer: Medicaid Other | Admitting: Cardiology

## 2021-10-15 ENCOUNTER — Other Ambulatory Visit (HOSPITAL_COMMUNITY): Payer: Self-pay

## 2021-10-15 ENCOUNTER — Encounter: Payer: Self-pay | Admitting: Cardiology

## 2021-10-15 VITALS — BP 110/68 | HR 73 | Ht 72.0 in | Wt 272.0 lb

## 2021-10-15 DIAGNOSIS — F17200 Nicotine dependence, unspecified, uncomplicated: Secondary | ICD-10-CM | POA: Diagnosis not present

## 2021-10-15 DIAGNOSIS — I5020 Unspecified systolic (congestive) heart failure: Secondary | ICD-10-CM

## 2021-10-15 DIAGNOSIS — I10 Essential (primary) hypertension: Secondary | ICD-10-CM

## 2021-10-15 DIAGNOSIS — I4892 Unspecified atrial flutter: Secondary | ICD-10-CM

## 2021-10-15 NOTE — Progress Notes (Signed)
?Cardiology Office Note:   ? ?Date:  10/15/2021  ? ?ID:  Chris [name], DOB 21-Dec-1960, MRN [id number] ? ?PCP:  [name], MD  ?[facility] Cardiologist:   ?[place] Electrophysiologist:  None  ? ?Referring MD: [name], MD  ? ?Chief Complaint  ?Patient presents with  ? OTher  ?  6 week follow up -- Meds reviewed verbally with patient.   ? ? ?History of Present Illness:   ? ?Chris [name] is a 61 y.o. male with a hx of hypertension, obesity, HFrEF EF 25%, atrial flutter s/p DCCV 06/2020, COPD, current smoker x40+ years, former cocaine use x25+ years, CKD who presents for follow-up.   ? ?Being seen for nonischemic cardiomyopathy and medication titration.  Tolerating current doses of BiDil, Coreg, torsemide.  Denies edema, denies chest pain.  He still smokes but is working on quitting.  Denies palpitations or bleeding issues with Eliquis. ? ? ?Prior notes ?Lexiscan Myoview 07/2021 fixed inferior inferolateral defect, no significant coronary calcifications, no significant ischemia. ?Suddenly admitted for shortness of breath, found to be in atrial flutter with RVR.   ?Underwent a TEE guided DC cardioversion 06/2020 .  ?Echocardiogram obtained 06/2020 while in the hospital showed EF of 25%.  ?left heart cath 2005 at [place] years ago with no obstructive disease. ? ?Past Medical History:  ?Diagnosis Date  ? Arrhythmia   ? atrial flutter  ? CHF (congestive heart failure) ([place])   ? Chronic kidney disease   ? COPD (chronic obstructive pulmonary disease) ([place])   ? Coronary artery disease   ? Depression   ? Diabetes mellitus without complication ([place])   ? GERD (gastroesophageal reflux disease)   ? Gout   ? Hypertension   ? Influenza A with respiratory manifestations   ? Mental disorder   ? ? ?Past Surgical History:  ?Procedure Laterality Date  ? ANKLE SURGERY    ? CARDIAC CATHETERIZATION    ? CARDIOVERSION N/A 07/08/2020  ? Procedure: CARDIOVERSION;  Surgeon: Corey Skains, MD;  Location: [facility] ORS;   Service: Cardiovascular;  Laterality: N/A;  ? HERNIA REPAIR    ? x2  ? SHOULDER SURGERY    ? TEE WITHOUT CARDIOVERSION N/A 07/08/2020  ? Procedure: TRANSESOPHAGEAL ECHOCARDIOGRAM (TEE);  Surgeon: Corey Skains, MD;  Location: [facility] ORS;  Service: Cardiovascular;  Laterality: N/A;  ? ? ?Current Medications: ?Current Meds  ?Medication Sig  ? Accu-Chek Softclix Lancets lancets Use as instructed  ? albuterol (PROVENTIL) (2.5 MG/3ML) 0.083% nebulizer solution TAKE 3 MLS BY NEBULIZATION EVERY 6 (SIX) HOURS AS NEEDED FOR SHORTNESS OF BREATH.  ? albuterol (VENTOLIN HFA) 108 (90 Base) MCG/ACT inhaler Inhale 2 puffs into the lungs every 6 (six) hours as needed for wheezing or shortness of breath.  ? allopurinol (ZYLOPRIM) 100 MG tablet TAKE 2 TABLETS (200 MG TOTAL) BY MOUTH DAILY.  ? apixaban (ELIQUIS) 5 MG TABS tablet TAKE 1 TABLET (5 MG TOTAL) BY MOUTH 2 (TWO) TIMES DAILY.  ? aspirin 81 MG EC tablet Take 1 tablet (81 mg total) by mouth daily.  ? atorvastatin (LIPITOR) 40 MG tablet Take 1 tablet (40 mg total) by mouth daily.  ? Blood Glucose Monitoring Suppl (ACCU-CHEK GUIDE) w/Device KIT Use as directed  ? budesonide-formoterol (SYMBICORT) 160-4.5 MCG/ACT inhaler Inhale 2 puffs into the lungs 2 (two) times daily.  ? carvedilol (COREG) 6.25 MG tablet Take 1 tablet (6.25 mg total) by mouth 2 (two) times daily.  ? colchicine 0.6 MG tablet Take 2 tabs (  1.2 mg) at the onset of a gout flare, may repeat 1 tab (0.6 mg) after 2 hours if symptoms persist.  ? dapagliflozin propanediol (FARXIGA) 10 MG TABS tablet Take 1 tablet (10 mg total) by mouth daily.  ? glipiZIDE (GLUCOTROL) 5 MG tablet Take 0.5 tablets (2.5 mg total) by mouth daily before breakfast.  ? glucose blood (ACCU-CHEK GUIDE) test strip Use as directed to check blood sugar 1-2 times a day  ? isosorbide-hydrALAZINE (BIDIL) 20-37.5 MG tablet Take 1 tablet by mouth 3 (three) times daily.  ? montelukast (SINGULAIR) 10 MG tablet TAKE 1 TABLET (10 MG TOTAL) BY MOUTH AT  BEDTIME.  ? sennosides-docusate sodium (SENOKOT-S) 8.6-50 MG tablet Take 1 tablet by mouth daily as needed for constipation.  ? torsemide (DEMADEX) 20 MG tablet Take 2 tablets (40 mg total) by mouth daily.  ?  ? ?Allergies:   Patient has no known allergies.  ? ?Social History  ? ?Socioeconomic History  ? Marital status: Divorced  ?  Spouse name: Not on file  ? Number of children: 3  ? Years of education: Not on file  ? Highest education level: High school graduate  ?Occupational History  ? Occupation: disability  ?Tobacco Use  ? Smoking status: Every Day  ?  Packs/day: 1.00  ?  Years: 43.00  ?  Pack years: 43.00  ?  Types: Cigarettes  ? Smokeless tobacco: Never  ? Tobacco comments:  ?  4 cigarettes/day x 2 month (04/14/2021)  ?Vaping Use  ? Vaping Use: Never used  ?Substance and Sexual Activity  ? Alcohol use: No  ? Drug use: Yes  ?  Frequency: 21.0 times per week  ?  Types: Marijuana, Cocaine  ?  Comment: last use Cocaine- 03/28/2021. Still using marijuana  ? Sexual activity: Not on file  ?Other Topics Concern  ? Not on file  ?Social History Narrative  ? ** Merged History Encounter **  ?    ? ?Social Determinants of Health  ? ?Financial Resource Strain: Medium Risk  ? Difficulty of Paying Living Expenses: Somewhat hard  ?Food Insecurity: No Food Insecurity  ? Worried About Charity fundraiser in the Last Year: Never true  ? Ran Out of Food in the Last Year: Never true  ?Transportation Needs: Unmet Transportation Needs  ? Lack of Transportation (Medical): Yes  ? Lack of Transportation (Non-Medical): Yes  ?Physical Activity: Insufficiently Active  ? Days of Exercise per Week: 5 days  ? Minutes of Exercise per Session: 20 min  ?Stress: Not on file  ?Social Connections: Moderately Isolated  ? Frequency of Communication with Friends and Family: More than three times a week  ? Frequency of Social Gatherings with Friends and Family: Once a week  ? Attends Religious Services: More than 4 times per year  ? Active Member of  Clubs or Organizations: No  ? Attends Archivist Meetings: Never  ? Marital Status: Divorced  ?  ? ?Family History: ?The patient's family history includes Diabetes in his mother; HIV in his brother; Healthy in his daughter and son; Heart disease in his father. ? ?ROS:   ?Please see the history of present illness.    ? All other systems reviewed and are negative. ? ?EKGs/Labs/Other Studies Reviewed:   ? ?The following studies were reviewed today: ? ? ?EKG:  EKG is ordered today.  EKG shows sinus rhythm with PVCs. ? ?Recent Labs: ?02/11/2021: Magnesium 2.0 ?04/27/2021: ALT 23; B Natriuretic Peptide 272.0; Hemoglobin 15.5; Platelets 150 ?  09/03/2021: BUN 30; Creatinine, Ser 2.03; Potassium 5.1; Sodium 136  ?Recent Lipid Panel ?   ?Component Value Date/Time  ? CHOL 176 12/25/2019 0632  ? CHOL 160 11/07/2019 0918  ? CHOL 157 10/21/2013 0435  ? TRIG 91 12/25/2019 0632  ? TRIG 117 10/21/2013 0435  ? HDL 37 (L) 12/25/2019 2563  ? HDL 40 11/07/2019 0918  ? HDL 30 (L) 10/21/2013 0435  ? CHOLHDL 4.8 12/25/2019 8937  ? VLDL 18 12/25/2019 0632  ? VLDL 23 10/21/2013 0435  ? [place] 121 (H) 12/25/2019 3428  ? [place] 92 11/07/2019 0918  ? [place] 104 (H) 10/21/2013 0435  ? ? ? ?Risk Assessment/Calculations:   ? ? ? ?Physical Exam:   ? ?VS:  BP 110/68 (BP Location: Right Arm, Patient Position: Sitting, Cuff Size: Large)   Pulse 73   Ht 6' (1.829 m)   Wt 272 lb (123.4 kg)   BMI 36.89 kg/m?    ? ?Wt Readings from Last 3 Encounters:  ?10/15/21 272 lb (123.4 kg)  ?09/10/21 271 lb 6.4 oz (123.1 kg)  ?09/03/21 270 lb (122.5 kg)  ?  ? ?GEN:  Well nourished, well developed in no acute distress ?HEENT: Normal ?NECK: No JVD; No carotid bruits ?CARDIAC: RRR, no murmurs, rubs, gallops ?RESPIRATORY: Decreased breath sounds at bases, no wheezing ?ABDOMEN: Soft, non-tender, distended ?MUSCULOSKELETAL:  No edema; No deformity  ?SKIN: Warm and dry ?NEUROLOGIC:  Alert and oriented x 3 ?PSYCHIATRIC:  Normal affect  ? ?ASSESSMENT:   ? ?1.  HFrEF (heart failure with reduced ejection fraction) ([place])   ?2. Atrial flutter, unspecified type ([place])   ?3. Primary hypertension   ?4. Smoking   ? ? ? ?PLAN:   ? ?In order of problems listed above: ? ?HFrEF, EF

## 2021-10-15 NOTE — Patient Instructions (Signed)
Medication Instructions:  ? ?Your physician recommends that you continue on your current medications as directed. Please refer to the Current Medication list given to you today.  ? ?*If you need a refill on your cardiac medications before your next appointment, please call your pharmacy* ? ? ?Lab Work: ? ?None ordered ? ?If you have labs (blood work) drawn today and your tests are completely normal, you will receive your results only by: ?MyChart Message (if you have MyChart) OR ?A paper copy in the mail ?If you have any lab test that is abnormal or we need to change your treatment, we will call you to review the results. ? ? ?Testing/Procedures: ? ?Your physician has requested that you have an echocardiogram in 3 months. Echocardiography is a painless test that uses sound waves to create images of your heart. It provides your doctor with information about the size and shape of your heart and how well your heart?s chambers and valves are working. This procedure takes approximately one hour. There are no restrictions for this procedure.  ? ? ?Follow-Up: ?At [facility], you and your health needs are our priority.  As part of our continuing mission to provide you with exceptional heart care, we have created designated Provider Care Teams.  These Care Teams include your primary Cardiologist (physician) and Advanced Practice Providers (APPs -  Physician Assistants and Nurse Practitioners) who all work together to provide you with the care you need, when you need it. ? ?We recommend signing up for the patient portal called "MyChart".  Sign up information is provided on this After Visit Summary.  MyChart is used to connect with patients for Virtual Visits (Telemedicine).  Patients are able to view lab/test results, encounter notes, upcoming appointments, etc.  Non-urgent messages can be sent to your provider as well.   ?To learn more about what you can do with MyChart, go to [email or web address].   ? ?Your next  appointment:   ?3  month(s) ? ?The format for your next appointment:   ?In Person ? ?Provider:   ?You may see [name], MD or one of the following Advanced Practice Providers on your designated Care Team:   ?Murray Hodgkins, NP ?Christell Faith, PA-C ?Cadence Kathlen Mody, PA-C  ? ? ?Other Instructions ? ? ?Important Information About Sugar ? ? ? ? ? ? ?

## 2021-10-16 ENCOUNTER — Telehealth: Payer: Self-pay

## 2021-10-16 NOTE — Telephone Encounter (Signed)
Spoke with patient and let him know that we have a hospital date for 11/19/21. Patient stated he would like that day. I let him not I would get him scheduled. ?

## 2021-10-19 ENCOUNTER — Other Ambulatory Visit: Payer: Self-pay

## 2021-10-19 DIAGNOSIS — Z1211 Encounter for screening for malignant neoplasm of colon: Secondary | ICD-10-CM

## 2021-10-19 DIAGNOSIS — R195 Other fecal abnormalities: Secondary | ICD-10-CM

## 2021-10-29 ENCOUNTER — Emergency Department (HOSPITAL_COMMUNITY)
Admission: EM | Admit: 2021-10-29 | Discharge: 2021-10-29 | Discharge status: Against Medical Advice | Payer: Medicaid Other | Attending: Emergency Medicine | Admitting: Emergency Medicine

## 2021-10-29 ENCOUNTER — Emergency Department (HOSPITAL_COMMUNITY): Payer: Medicaid Other

## 2021-10-29 ENCOUNTER — Other Ambulatory Visit: Payer: Self-pay

## 2021-10-29 DIAGNOSIS — R0789 Other chest pain: Secondary | ICD-10-CM | POA: Diagnosis not present

## 2021-10-29 DIAGNOSIS — Z7901 Long term (current) use of anticoagulants: Secondary | ICD-10-CM | POA: Insufficient documentation

## 2021-10-29 DIAGNOSIS — Z7982 Long term (current) use of aspirin: Secondary | ICD-10-CM | POA: Diagnosis not present

## 2021-10-29 DIAGNOSIS — I509 Heart failure, unspecified: Secondary | ICD-10-CM | POA: Diagnosis not present

## 2021-10-29 DIAGNOSIS — R0602 Shortness of breath: Secondary | ICD-10-CM | POA: Diagnosis present

## 2021-10-29 DIAGNOSIS — R079 Chest pain, unspecified: Secondary | ICD-10-CM | POA: Diagnosis not present

## 2021-10-29 DIAGNOSIS — Z79899 Other long term (current) drug therapy: Secondary | ICD-10-CM | POA: Insufficient documentation

## 2021-10-29 DIAGNOSIS — J441 Chronic obstructive pulmonary disease with (acute) exacerbation: Secondary | ICD-10-CM

## 2021-10-29 DIAGNOSIS — I110 Hypertensive heart disease with heart failure: Secondary | ICD-10-CM | POA: Diagnosis not present

## 2021-10-29 DIAGNOSIS — I10 Essential (primary) hypertension: Secondary | ICD-10-CM | POA: Diagnosis not present

## 2021-10-29 LAB — BASIC METABOLIC PANEL
Anion gap: 7 (ref 5–15)
BUN: 33 mg/dL — ABNORMAL HIGH (ref 6–20)
CO2: 17 mmol/L — ABNORMAL LOW (ref 22–32)
Calcium: 8.3 mg/dL — ABNORMAL LOW (ref 8.9–10.3)
Chloride: 112 mmol/L — ABNORMAL HIGH (ref 98–111)
Creatinine, Ser: 2.19 mg/dL — ABNORMAL HIGH (ref 0.61–1.24)
GFR, Estimated: 34 mL/min — ABNORMAL LOW (ref >60)
Glucose, Bld: 122 mg/dL — ABNORMAL HIGH (ref 70–99)
Potassium: 4.6 mmol/L (ref 3.5–5.1)
Sodium: 136 mmol/L (ref 135–145)

## 2021-10-29 LAB — CBC
HCT: 41.4 % (ref 39.0–52.0)
Hemoglobin: 13.6 g/dL (ref 13.0–17.0)
MCH: 31.2 pg (ref 26.0–34.0)
MCHC: 32.9 g/dL (ref 30.0–36.0)
MCV: 95 fL (ref 80.0–100.0)
Platelets: 157 10*3/uL (ref 150–400)
RBC: 4.36 MIL/uL (ref 4.22–5.81)
RDW: 15.3 % (ref 11.5–15.5)
WBC: 12.5 10*3/uL — ABNORMAL HIGH (ref 4.0–10.5)
nRBC: 0 % (ref 0.0–0.2)

## 2021-10-29 LAB — BRAIN NATRIURETIC PEPTIDE: B Natriuretic Peptide: 42.6 pg/mL (ref 0.0–100.0)

## 2021-10-29 LAB — TROPONIN I (HIGH SENSITIVITY): Troponin I (High Sensitivity): 20 ng/L — ABNORMAL HIGH (ref <18)

## 2021-10-29 MED ORDER — IPRATROPIUM-ALBUTEROL 0.5-2.5 (3) MG/3ML IN SOLN
3.0000 mL | Freq: Once | RESPIRATORY_TRACT | Status: AC
  Administered 2021-10-29: 3 mL via RESPIRATORY_TRACT
  Filled 2021-10-29: qty 3

## 2021-10-29 MED ORDER — PREDNISONE 20 MG PO TABS: 40.0000 mg | ORAL_TABLET | Freq: Every day | ORAL | 0 refills | Status: DC

## 2021-10-29 MED ORDER — METHYLPREDNISOLONE SODIUM SUCC 125 MG IJ SOLR
125.0000 mg | INTRAMUSCULAR | Status: AC
  Administered 2021-10-29: 125 mg via INTRAVENOUS
  Filled 2021-10-29: qty 2

## 2021-10-29 MED ORDER — GUAIFENESIN-DM 100-10 MG/5ML PO SYRP
10.0000 mL | ORAL_SOLUTION | ORAL | Status: AC
Start: 2021-10-29 — End: 2021-10-29
  Administered 2021-10-29: 10 mL via ORAL
  Filled 2021-10-29: qty 10

## 2021-10-29 NOTE — ED Triage Notes (Addendum)
BIB [facility] after pt called to report C/P and productive cough x 3 days. Pt given ASA, SL nitro en route.   HX: CHF, HTN, DM

## 2021-10-29 NOTE — ED Provider Notes (Signed)
[facility] Provider Note   CSN: [id number] Arrival date & time: 10/29/21  0859     History  Chief Complaint  Patient presents with   Chest Pain    Chris [name] is a 61 y.o. male with history of hypertension, obesity, HFrEF ejection fraction 25%, a flutter status post DCCV 1/22, COPD current smoker x40+ years, former cocaine user x25+ years, CKD.  Patient presents ED via EMS for evaluation of chest pain, shortness of breath and productive cough x3 days.  Patient states that the chest pain is located in the center of his chest, worsened with inspiration and palpation.  Patient denies any radiation or exertional type symptoms. Patient denies any recent fevers, leg swelling, body aches or chills, nausea or vomiting.  Patient also adds that he nearly syncopized due to these coughing fits he has been having the last 3 days.  Patient denies any lightheadedness, dizziness, weakness or recent syncopal events on examination.   Chest Pain Associated symptoms: cough and shortness of breath   Associated symptoms: no fever, no nausea and no vomiting       Home Medications Prior to Admission medications   Medication Sig Start Date End Date Taking? Authorizing Provider  albuterol (PROVENTIL) (2.5 MG/3ML) 0.083% nebulizer solution TAKE 3 MLS BY NEBULIZATION EVERY 6 (SIX) HOURS AS NEEDED FOR SHORTNESS OF BREATH. 10/09/21 10/09/22 Yes [name], MD  albuterol (VENTOLIN HFA) 108 (90 Base) MCG/ACT inhaler Inhale 2 puffs into the lungs every 6 (six) hours as needed for wheezing or shortness of breath. 05/21/21  Yes [name], MD  allopurinol (ZYLOPRIM) 100 MG tablet TAKE 2 TABLETS (200 MG TOTAL) BY MOUTH DAILY. 11/30/20 11/30/21 Yes [name], MD  apixaban (ELIQUIS) 5 MG TABS tablet TAKE 1 TABLET (5 MG TOTAL) BY MOUTH 2 (TWO) TIMES DAILY. 12/01/20 12/01/21 Yes [name], MD  aspirin 81 MG EC tablet Take 1 tablet (81 mg total) by mouth  daily. 11/30/20  Yes [name], MD  atorvastatin (LIPITOR) 40 MG tablet Take 1 tablet (40 mg total) by mouth daily. 11/30/20  Yes [name], MD  budesonide-formoterol [facility]) 160-4.5 MCG/ACT inhaler Inhale 2 puffs into the lungs 2 (two) times daily. 11/30/20  Yes [name], MD  carvedilol (COREG) 6.25 MG tablet Take 1 tablet (6.25 mg total) by mouth 2 (two) times daily. 12/01/20  Yes [name], FNP  colchicine 0.6 MG tablet Take 2 tabs (1.2 mg) at the onset of a gout flare, may repeat 1 tab (0.6 mg) after 2 hours if symptoms persist. 10/09/21  Yes [name], MD  dapagliflozin propanediol (FARXIGA) 10 MG TABS tablet Take 1 tablet (10 mg total) by mouth daily. 12/01/20  Yes [name], FNP  glipiZIDE (GLUCOTROL) 5 MG tablet Take 0.5 tablets (2.5 mg total) by mouth daily before breakfast. 08/20/21  Yes [name], MD  isosorbide-hydrALAZINE (BIDIL) 20-37.5 MG tablet Take 1 tablet by mouth 3 (three) times daily. 08/20/21  Yes [name], MD  montelukast (SINGULAIR) 10 MG tablet TAKE 1 TABLET (10 MG TOTAL) BY MOUTH AT BEDTIME. 05/15/21 05/15/22 Yes [name], MD  predniSONE (DELTASONE) 20 MG tablet Take 2 tablets (40 mg total) by mouth daily. 10/29/21  Yes [name], PA-C  sennosides-docusate sodium (SENOKOT-S) 8.6-50 MG tablet Take 1 tablet by mouth daily as needed for constipation. 08/20/21  Yes [name], MD  torsemide (DEMADEX) 20 MG tablet Take  2 tablets (40 mg total) by mouth daily. 07/10/21 11/08/21 Yes [name], MD  Accu-Chek Softclix Lancets lancets Use as instructed 12/12/20   [name], MD  Blood Glucose Monitoring Suppl (ACCU-CHEK GUIDE) w/Device KIT Use as directed 12/12/20   [name], MD  glucose blood (ACCU-CHEK GUIDE) test strip Use as directed to check blood sugar 1-2 times a day 12/12/20   [name], MD  metoprolol tartrate (LOPRESSOR) 100 MG tablet Take 1 tablet (100 mg total)  by mouth 2 (two) times daily. 07/09/20 07/11/20  [name], MD      Allergies    Patient has no known allergies.    Review of Systems   Review of Systems  Constitutional:  Negative for chills and fever.  Respiratory:  Positive for cough, chest tightness and shortness of breath.   Cardiovascular:  Positive for chest pain. Negative for leg swelling.  Gastrointestinal:  Negative for nausea and vomiting.  All other systems reviewed and are negative.  Physical Exam Updated Vital Signs BP (!) 136/97   Pulse 80   Temp 97.7 F (36.5 C) (Oral)   Resp (!) 22   SpO2 97%  Physical Exam Vitals and nursing note reviewed.  Constitutional:      General: He is not in acute distress.    Appearance: He is well-developed. He is not ill-appearing, toxic-appearing or diaphoretic.  HENT:     Head: Normocephalic and atraumatic.     Nose: Nose normal. No congestion.     Mouth/Throat:     Mouth: Mucous membranes are moist.     Pharynx: Oropharynx is clear.  Eyes:     Extraocular Movements: Extraocular movements intact.     Pupils: Pupils are equal, round, and reactive to light.  Cardiovascular:     Rate and Rhythm: Normal rate and regular rhythm.  Pulmonary:     Effort: Pulmonary effort is normal.     Breath sounds: Wheezing and rhonchi present.  Chest:     Chest wall: Tenderness present.  Abdominal:     General: Abdomen is flat. Bowel sounds are normal. There is no distension.     Palpations: Abdomen is soft.     Tenderness: There is no abdominal tenderness.  Musculoskeletal:     Cervical back: Normal range of motion and neck supple.     Right lower leg: No edema.     Left lower leg: No edema.  Skin:    General: Skin is warm and dry.     Capillary Refill: Capillary refill takes less than 2 seconds.  Neurological:     Mental Status: He is alert and oriented to person, place, and time.    ED Results / Procedures / Treatments   Labs (all labs ordered are listed, but only abnormal  results are displayed) Labs Reviewed  BASIC METABOLIC PANEL - Abnormal; Notable for the following components:      Result Value   Chloride 112 (*)    CO2 17 (*)    Glucose, Bld 122 (*)    BUN 33 (*)    Creatinine, Ser 2.19 (*)    Calcium 8.3 (*)    GFR, Estimated 34 (*)    All other components within normal limits  CBC - Abnormal; Notable for the following components:   WBC 12.5 (*)    All other components within normal limits  TROPONIN I (HIGH SENSITIVITY) - Abnormal; Notable for the following components:   Troponin I (High Sensitivity) 20 (*)  All other components within normal limits  BRAIN NATRIURETIC PEPTIDE  TROPONIN I (HIGH SENSITIVITY)    EKG EKG Interpretation  Date/Time:  Thursday Oct 29 2021 09:14:04 EDT Ventricular Rate:  74 PR Interval:  152 QRS Duration: 102 QT Interval:  410 QTC Calculation: 455 R Axis:   -12 Text Interpretation: Sinus rhythm Abnormal R-wave progression, early transition Nonspecific T abnormalities, lateral leads No significant change since last tracing Confirmed by Dorie Rank [phone]) on 10/29/2021 9:20:03 AM  Radiology DG Chest 2 View  Result Date: 10/29/2021 CLINICAL DATA:  61 year old male with chest pain. EXAM: CHEST - 2 VIEW COMPARISON:  Chest CT 04/27/2021 and earlier. FINDINGS: Seated upright AP and lateral views at 0923 hours. Stable lung volumes and mediastinal contours, borderline to mild cardiomegaly. Visualized tracheal air column is within normal limits. Mild chronic lung base scarring and/or atelectasis appears similar to the prior CT. Elsewhere both lungs appear clear. No pneumothorax, pulmonary edema, pleural effusion. No acute osseous abnormality identified. Negative visible bowel gas. IMPRESSION: No acute cardiopulmonary abnormality. Stable compared to Chest CT in November. Electronically Signed   By: Genevie Ann M.D.   On: 10/29/2021 09:37    Procedures Procedures    Medications Ordered in ED Medications   ipratropium-albuterol (DUONEB) 0.5-2.5 (3) MG/3ML nebulizer solution 3 mL (3 mLs Nebulization Given 10/29/21 0957)  guaiFENesin-dextromethorphan (ROBITUSSIN DM) 100-10 MG/5ML syrup 10 mL (10 mLs Oral Given 10/29/21 0958)  methylPREDNISolone sodium succinate (SOLU-MEDROL) 125 mg/2 mL injection 125 mg (125 mg Intravenous Given 10/29/21 1014)    ED Course/ Medical Decision Making/ A&P                           Medical Decision Making Amount and/or Complexity of Data Reviewed Labs: ordered. Radiology: ordered.  Risk OTC drugs. Prescription drug management.   61 year old male presents ED for evaluation of productive cough, chest pain and shortness of breath for 3 days.  Please see HPI for further details.  On my examination, the patient is afebrile, nontachycardic.  The patient is nonhypoxic on room air with wheezing and rhonchi noted to bilateral lower lung fields.  Patient abdomen soft and compressible in all 4 quadrants.  Patient follows commands appropriately.  Patient handles secretions appropriately.  Patient alert and oriented x3.  Patient worked up utilizing the following labs and imaging studies interpreted by me personally: - CBC shows elevated white blood cell count of 12.5, patient afebrile, nontachycardic - BMP shows elevated creatinine to 2.19 which is consistent with patient baseline, elevated BUN which is nonspecific - BNP unremarkable at 42.6 - Troponin at 20, this is actually decreased from baseline troponin of 36 taken 6 months ago.  Pending delta troponin. - Chest x-ray shows no active cardiopulmonary disease.  I agree with radiologist interpretation.  Update: Nursing staff notified me that the patient was refusing his second troponin.  I went to reengage the patient and he stated that his ride was here and he did not have time to have a second troponin drawn.  I educated the patient on why we wish to draw the second troponin and he persisted that he wished to be  discharged.  I advised the patient that if he wanted to leave he would have to sign out AMA and he agreed to do so.  Patient provided with return precautions and he voiced understanding with these.  Advised patient that I would still send him his prednisone which he should take once daily  for the next 4 days.  Patient voiced understanding with return precautions.  Patient alert and oriented x3.  Patient is leaving [place].   Final Clinical Impression(s) / ED Diagnoses Final diagnoses:  COPD exacerbation ([place])    Rx / DC Orders ED Discharge Orders          Ordered    predniSONE (DELTASONE) 20 MG tablet  Daily        10/29/21 1102              [name], [place] 10/29/21 1103    Dorie Rank, MD 10/30/21 [phone]

## 2021-11-11 ENCOUNTER — Other Ambulatory Visit: Payer: Self-pay

## 2021-11-11 ENCOUNTER — Other Ambulatory Visit: Payer: Medicaid Other

## 2021-11-11 ENCOUNTER — Telehealth: Payer: Self-pay

## 2021-11-11 DIAGNOSIS — R195 Other fecal abnormalities: Secondary | ICD-10-CM

## 2021-11-11 MED ORDER — NA SULFATE-K SULFATE-MG SULF 17.5-3.13-1.6 GM/177ML PO SOLN
1.0000 | Freq: Once | ORAL | 0 refills | Status: AC
  Filled 2021-11-11: qty 354, 1d supply, fill #0

## 2021-11-11 NOTE — Telephone Encounter (Signed)
Called patient and let him know to hold Eliquis 3 days prior to his procedure on 11/19/21. I also made sure patient was aware of prep instructions and that he had no questions before his procedure. Patient had no questions at the end of call.

## 2021-11-11 NOTE — Telephone Encounter (Signed)
   Chris [name] 07/15/1960 [id number]  Dear Dr.Deborah Wynetta Emery:  We have scheduled the above named patient for a(n) colonoscopy procedure. Our records show that (s)he is on anticoagulation therapy.  Please advise as to whether the patient may come off their therapy of Eliquis 2 days prior to their procedure which is scheduled for 11/19/21.  Please route your response to [facility] or fax response to [phone].  Sincerely,    [facility]

## 2021-11-12 ENCOUNTER — Encounter (HOSPITAL_COMMUNITY): Payer: Self-pay | Admitting: Gastroenterology

## 2021-11-12 NOTE — Progress Notes (Signed)
Attempted to obtain medical history via telephone, unable to reach at this time. Unable to leave voicemail to return pre surgical testing department's phone call,due to mailbox full.  

## 2021-11-16 ENCOUNTER — Other Ambulatory Visit: Payer: Self-pay

## 2021-11-16 ENCOUNTER — Other Ambulatory Visit: Payer: Self-pay | Admitting: *Deleted

## 2021-11-16 NOTE — Patient Instructions (Signed)
Visit Information  Chris [name] was given information about Medicaid Managed Care team care coordination services as a part of their Healthy [facility] Medicaid benefit. Chris [name] verbally consented to engagement with the [facility] Managed Care team.   If you are experiencing a medical emergency, please call 911 or report to your local emergency department or urgent care.   If you have a non-emergency medical problem during routine business hours, please contact your provider's office and ask to speak with a nurse.   For questions related to your Healthy [facility] health plan, please call: [phone] or visit the homepage here: [email or web address]  If you would like to schedule transportation through your Healthy [facility] plan, please call the following number at least 2 days in advance of your appointment: [phone]  For information about your ride after you set it up, call Ride Assist at [phone]. Use this number to activate a Will Call pickup, or if your transportation is late for a scheduled pickup. Use this number, too, if you need to make a change or cancel a previously scheduled reservation.  If you need transportation services right away, call [phone]. The after-hours call center is staffed 24 hours to handle ride assistance and urgent reservation requests (including discharges) 365 days a year. Urgent trips include sick visits, hospital discharge requests and life-sustaining treatment.  Call the [place] at [phone], at any time, 24 hours a day, 7 days a week. If you are in danger or need immediate medical attention call 911.  If you would like help to quit smoking, call 1-800-QUIT-NOW 601-106-8219) OR Espaol: 1-855-Djelo-Ya (4-742-595-6387) o para ms informacin haga clic aqu or Text READY to 200-400 to register via text  Chris [name] - following are the goals we discussed in your visit today:    Goals Addressed   None     Please see education materials related to COPD and colonoscopy provided as print materials.   The patient verbalized understanding of instructions, educational materials, and care plan provided today and agreed to receive a mailed copy of patient instructions, educational materials, and care plan.   Telephone follow up appointment with Managed Medicaid care management team member scheduled for:12/17/21 @ 1:15pm  [name] RN, BSN [place] RN Care Coordinator   Following is a copy of your plan of care:  Care Plan : RN Care Manager Plan of Care  Updates made by [name], RN since 11/16/2021 12:00 AM     Problem: Knowledge Deficits and Care Coordination Needs related to long term management of CHF   Priority: High     Long-Range Goal: Development of Plan of Care to address Care Coordination Needs and Knowledge Deficits related to CHF   Start Date: 04/28/2021  Expected End Date: 01/11/2022  Priority: High  Note:   Current Barriers:  Knowledge Deficits related to plan of care for management of CHF  Care Coordination needs related to Transportation  Non-adherence to prescribed medication regimen Difficulty obtaining medications Chris [name] has Colonoscopy scheduled on 11/19/21. He picked up his prep and has questions on how to proceed. He has transportation arranged for this procedure.  RNCM Clinical Goal(s):  Patient will verbalize understanding of plan for management of CHF as evidenced by patient verbalization of plan take all medications exactly as prescribed and will call provider for medication related questions as evidenced by physician notes in EMR    attend all scheduled medical appointments: Cardiology 10/15/21 as evidenced by  physician notes in EMR        demonstrate improved adherence to prescribed treatment plan for CHF as evidenced by attending Cardiology 10/15/21 @ 8:40am work with pharmacist to address  Medication procurement related to CHF as evidenced by review of EMR and patient or pharmacist report    through collaboration with Consulting civil engineer, provider, and care team.   Interventions: Inter-disciplinary care team collaboration (see longitudinal plan of care) Evaluation of current treatment plan related to  self management and patient's adherence to plan as established by provider Advised patient to take all medications with him to all appointments   GI issues  (Status:  New goal.)  Long Term Goal Evaluation of current treatment plan related to  GI issues ,  self-management and patient's adherence to plan as established by provider. Discussed plans with patient for ongoing care management follow up and provided patient with direct contact information for care management team Evaluation of current treatment plan related to GI and patient's adherence to plan as established by provider Provided education to patient re: GI Reviewed scheduled/upcoming provider appointments including Advised patient to discuss any abdominal changes with pain or bowel movements with provider Advised patient to stop taking Eliquis until after procedure or as instructed by provider Collaborated with [facility] for patient instructions-they will reach out to patient today   COPD Interventions:  (Status:  Goal on track:  NO.) Long Term Goal Advised patient to track and manage COPD triggers Advised patient to self assesses COPD action plan zone and make appointment with provider if in the yellow zone for 48 hours without improvement Discussed the importance of adequate rest and management of fatigue with COPD Assessed social determinant of health barriers Advised patient to discuss concerns with provider at next visit   Heart Failure Interventions:  (Status: Condition stable. Not addressed this visit.)  Long Term Goal  Provided education on low sodium diet Reviewed role of diuretics in prevention of fluid  overload and management of heart failure Discussed the importance of keeping all appointments with provider Provided patient with education about the role of exercise in the management of heart failure Assessed social determinant of health barriers   Patient Goals/Self-Care Activities: Patient will self administer medications as prescribed as evidenced by self report/primary caregiver report  Patient will attend all scheduled provider appointments as evidenced by clinician review of documented attendance to scheduled appointments and patient/caregiver report Patient will attend church or other social activities as evidenced by patient report Patient will continue to perform ADL's independently as evidenced by patient/caregiver report Patient will continue to perform IADL's independently as evidenced by patient/caregiver report Patient will call provider office for new concerns or questions as evidenced by review of documented incoming telephone call notes and patient report Patient will work with [facility] Pharmacist for medication management as evidenced by notes in EMR call office if I gain more than 2 pounds in one day or 5 pounds in one week use salt in moderation watch for swelling in feet, ankles and legs every day weigh myself daily know when to call the doctor

## 2021-11-16 NOTE — Patient Outreach (Signed)
Medicaid Managed Care   Nurse Care Manager Note  11/16/2021 Name:  Chris [name] MRN:  [id number] DOB:  Aug 06, 1960  Chris [name] is an 61 y.o. year old male who is a primary patient of [name], MD.  The [facility] Managed Care Coordination team was consulted for assistance with:    COPD GI   [name] was given information about Medicaid Managed Care Coordination team services today. Chris [name] Patient agreed to services and verbal consent obtained.  Engaged with patient by telephone for follow up visit in response to provider referral for case management and/or care coordination services.   Assessments/Interventions:  Review of past medical history, allergies, medications, health status, including review of consultants reports, laboratory and other test data, was performed as part of comprehensive evaluation and provision of chronic care management services.  SDOH (Social Determinants of Health) assessments and interventions performed: SDOH Interventions    Flowsheet Row Most Recent Value  SDOH Interventions   Physical Activity Interventions Intervention Not Indicated       Care Plan  No Known Allergies  Medications Reviewed Today     Reviewed by [name], RN (Registered Nurse) on 11/16/21 at 1146  Med List Status: <None>   Medication Order Taking? Sig Documenting Provider Last Dose Status Informant  Accu-Chek Softclix Lancets lancets [id number] No Use as instructed  Patient not taking: Reported on 11/16/2021   [name], MD Not Taking Active Self, Pharmacy Records  albuterol (PROVENTIL) (2.5 MG/3ML) 0.083% nebulizer solution [id number] Yes TAKE 3 MLS BY NEBULIZATION EVERY 6 (SIX) HOURS AS NEEDED FOR SHORTNESS OF BREATH. [name], MD Taking Active Self, Pharmacy Records  albuterol (VENTOLIN HFA) 108 [phone] Base) MCG/ACT inhaler [id number] Yes Inhale 2 puffs into the lungs every 6 (six) hours as needed for wheezing or shortness of breath.  [name], MD Taking Active Self, Pharmacy Records  allopurinol (ZYLOPRIM) 100 MG tablet [id number] Yes TAKE 2 TABLETS (200 MG TOTAL) BY MOUTH DAILY. [name], MD Taking Active Self, Pharmacy Records           Med Note Chris [name] Apr 27, 2021  1:29 PM)    apixaban (ELIQUIS) 5 MG TABS tablet [id number] Yes TAKE 1 TABLET (5 MG TOTAL) BY MOUTH 2 (TWO) TIMES DAILY. [name], MD Taking Active Self, Pharmacy Records           Med Note Chris [name], Chris [name] Oct 29, 2021 10:03 AM) Dispense report does not support but pt said he took this yesterday  aspirin 81 MG EC tablet [id number] Yes Take 1 tablet (81 mg total) by mouth daily. [name], MD Taking Active Self, Pharmacy Records           Med Note Chris [name] Apr 27, 2021  1:27 PM)    atorvastatin (LIPITOR) 40 MG tablet [id number] Yes Take 1 tablet (40 mg total) by mouth daily. [name], MD Taking Active Self, Pharmacy Records           Med Note Chris [name] Apr 27, 2021  1:29 PM)    Blood Glucose Monitoring Suppl (ACCU-CHEK GUIDE) w/Device KIT [id number] No Use as directed  Patient not taking: Reported on 11/16/2021   [name], MD Not Taking Active Self, Pharmacy Records  budesonide-formoterol [facility]) 160-4.5 MCG/ACT inhaler [id number] Yes Inhale 2 puffs into the lungs 2 (two) times daily. Wynetta Emery,  Dalbert Batman, MD Taking Active Self, Pharmacy Records           Med Note Chris [name] Apr 27, 2021  1:29 PM)    carvedilol (COREG) 6.25 MG tablet [id number] Yes Take 1 tablet (6.25 mg total) by mouth 2 (two) times daily. Alisa Graff, FNP Taking Active Self, Pharmacy Records           Med Note Chris [name] Oct 29, 2021 10:02 AM) Last dispense date is January but pt states he took this yesterday  colchicine 0.6 MG tablet [id number] Yes Take 2 tabs (1.2 mg) at the onset of a gout flare, may repeat 1 tab (0.6 mg) after 2 hours if symptoms  persist. [name], MD Taking Active Self, Pharmacy Records  dapagliflozin propanediol (FARXIGA) 10 MG TABS tablet [id number] Yes Take 1 tablet (10 mg total) by mouth daily. Alisa Graff, FNP Taking Active Self, Pharmacy Records           Med Note Chris [name] Oct 29, 2021 10:02 AM) Last dispense date was January but pt states he took this yesterday  glipiZIDE (GLUCOTROL) 5 MG tablet [id number] Yes Take 0.5 tablets (2.5 mg total) by mouth daily before breakfast. [name], MD Taking Active Self, Pharmacy Records           Med Note [place], Chris [name] Oct 29, 2021 10:01 AM) Pt states he also took this yesterday even though dispense dates he should be out of it  glucose blood (ACCU-CHEK GUIDE) test strip [id number] No Use as directed to check blood sugar 1-2 times a day  Patient not taking: Reported on 11/16/2021   [name], MD Not Taking Active Self, Pharmacy Records  isosorbide-hydrALAZINE (BIDIL) 20-37.5 MG tablet [id number] Yes Take 1 tablet by mouth 3 (three) times daily. [name], MD Taking Active Self, Pharmacy Records           Med Note Chris [name], [name]   Thu Oct 29, 2021 10:00 AM) Pt states he took this yesterday but it looks like it hasnt be dispensed since march    Discontinued 07/11/20 0943 (Discontinued by provider)   montelukast (SINGULAIR) 10 MG tablet [id number] Yes TAKE 1 TABLET (10 MG TOTAL) BY MOUTH AT BEDTIME. [name], MD Taking Active Self, Pharmacy Records  Na Sulfate-K Sulfate-Mg Sulf 17.5-3.13-1.6 GM/177ML SOLN [id number]  Take 1 kit by mouth once for 1 dose. Chris November, MD  Active   predniSONE (DELTASONE) 20 MG tablet [id number] Yes Take 2 tablets (40 mg total) by mouth daily. Chris Cecil, PA-C Taking Active   sennosides-docusate sodium (SENOKOT-S) 8.6-50 MG tablet [id number] Yes Take 1 tablet by mouth daily as needed for constipation. [name], MD Taking Active Self, Pharmacy  Records  torsemide [facility]) 20 MG tablet [id number] Yes Take 2 tablets (40 mg total) by mouth daily. Chris Sable, MD Taking Active Self, Pharmacy Records            Patient Active Problem List   Diagnosis Date Noted   Right kidney mass 05/14/2021   CHF exacerbation ([place]) 04/14/2021   Chest pain 04/14/2021   Syncope 04/14/2021   Left-sided weakness 10/28/2020   COPD exacerbation (HCC)    Typical atrial flutter (HCC)    CHF (congestive heart failure) ([place]) 07/04/2020   Acute exacerbation of CHF (congestive heart failure) ([place]) 06/16/2020   COPD with acute exacerbation ([place])  06/16/2020   Influenza vaccine refused 05/06/2020   Acute on chronic combined systolic (congestive) and diastolic (congestive) heart failure ([place]) 05/05/2020   Acute decompensated heart failure ([place]) 05/04/2020   Illiteracy 05/04/2020   Type 2 diabetes mellitus with stage 3 chronic kidney disease ([place]) 12/25/2019   Acute on chronic combined systolic and diastolic CHF (congestive heart failure) ([place]) 10/26/2019   Elevated troponin I level 10/26/2019   Acute on chronic diastolic (congestive) heart failure ([place]) 10/26/2019   History of gout 02/01/2019   Seasonal allergic rhinitis due to pollen 02/01/2019   Tobacco dependence 11/30/2018   Microscopic hematuria 11/30/2018   Depression 11/30/2018   Difficulty controlling anger 11/30/2018   CAP (community acquired pneumonia) 08/11/2018   COPD (chronic obstructive pulmonary disease) ([place])    CKD (chronic kidney disease) stage 3, GFR 30-59 ml/min ([place]) 08/10/2018   Recurrent epistaxis 04/21/2018   Mixed hyperlipidemia 07/28/2017   Essential hypertension 79/89/2119   Chronic systolic heart failure ([place]) 10/25/2014   Cocaine abuse ([place]) 02/20/2013   Cannabis abuse 02/20/2013   Back pain, chronic 02/20/2013    Conditions to be addressed/monitored per PCP order:  COPD and GI  Care Plan : RN Care Manager Plan of Care  Updates made by [name], RN  since 11/16/2021 12:00 AM     Problem: Knowledge Deficits and Care Coordination Needs related to long term management of CHF   Priority: High     Long-Range Goal: Development of Plan of Care to address Care Coordination Needs and Knowledge Deficits related to CHF   Start Date: 04/28/2021  Expected End Date: 01/11/2022  Priority: High  Note:   Current Barriers:  Knowledge Deficits related to plan of care for management of CHF  Care Coordination needs related to Transportation  Non-adherence to prescribed medication regimen Difficulty obtaining medications Mr. Bollen has Colonoscopy scheduled on 11/19/21. He picked up his prep and has questions on how to proceed. He has transportation arranged for this procedure.  RNCM Clinical Goal(s):  Patient will verbalize understanding of plan for management of CHF as evidenced by patient verbalization of plan take all medications exactly as prescribed and will call provider for medication related questions as evidenced by physician notes in EMR    attend all scheduled medical appointments: Cardiology 10/15/21 as evidenced by physician notes in EMR        demonstrate improved adherence to prescribed treatment plan for CHF as evidenced by attending Cardiology 10/15/21 @ 8:40am work with pharmacist to address Medication procurement related to CHF as evidenced by review of EMR and patient or pharmacist report    through collaboration with Consulting civil engineer, provider, and care team.   Interventions: Inter-disciplinary care team collaboration (see longitudinal plan of care) Evaluation of current treatment plan related to  self management and patient's adherence to plan as established by provider Advised patient to take all medications with him to all appointments   GI issues  (Status:  New goal.)  Long Term Goal Evaluation of current treatment plan related to  GI issues ,  self-management and patient's adherence to plan as established by provider. Discussed  plans with patient for ongoing care management follow up and provided patient with direct contact information for care management team Evaluation of current treatment plan related to GI and patient's adherence to plan as established by provider Provided education to patient re: GI Reviewed scheduled/upcoming provider appointments including Advised patient to discuss any abdominal changes with pain or bowel movements with provider Advised patient  to stop taking Eliquis until after procedure or as instructed by provider Collaborated with [facility] for patient instructions-they will reach out to patient today   COPD Interventions:  (Status:  Goal on track:  NO.) Long Term Goal Advised patient to track and manage COPD triggers Advised patient to self assesses COPD action plan zone and make appointment with provider if in the yellow zone for 48 hours without improvement Discussed the importance of adequate rest and management of fatigue with COPD Assessed social determinant of health barriers Advised patient to discuss concerns with provider at next visit   Heart Failure Interventions:  (Status: Condition stable. Not addressed this visit.)  Long Term Goal  Provided education on low sodium diet Reviewed role of diuretics in prevention of fluid overload and management of heart failure Discussed the importance of keeping all appointments with provider Provided patient with education about the role of exercise in the management of heart failure Assessed social determinant of health barriers   Patient Goals/Self-Care Activities: Patient will self administer medications as prescribed as evidenced by self report/primary caregiver report  Patient will attend all scheduled provider appointments as evidenced by clinician review of documented attendance to scheduled appointments and patient/caregiver report Patient will attend church or other social activities as evidenced by patient report Patient  will continue to perform ADL's independently as evidenced by patient/caregiver report Patient will continue to perform IADL's independently as evidenced by patient/caregiver report Patient will call provider office for new concerns or questions as evidenced by review of documented incoming telephone call notes and patient report Patient will work with [facility] Pharmacist for medication management as evidenced by notes in EMR call office if I gain more than 2 pounds in one day or 5 pounds in one week use salt in moderation watch for swelling in feet, ankles and legs every day weigh myself daily know when to call the doctor        Follow Up:  Patient agrees to Care Plan and Follow-up.  Plan: The Managed Medicaid care management team will reach out to the patient again over the next 30 days.  Date/time of next scheduled RN care management/care coordination outreach:  12/17/21 @ 1:15pm  [name] RN, BSN [place]  [employer] manager

## 2021-11-16 NOTE — Telephone Encounter (Signed)
Received a message from [name] stating that patient was confused about prep instructions. I called patient to see if he could come to the office to go over prep instructions. Patient stated that he did not have a ride so we went over specific questions that the patient had. I also let patient know to call office if he had more questions.

## 2021-11-19 ENCOUNTER — Other Ambulatory Visit: Payer: Self-pay

## 2021-11-19 ENCOUNTER — Encounter (HOSPITAL_COMMUNITY): Payer: Self-pay | Admitting: Gastroenterology

## 2021-11-19 ENCOUNTER — Ambulatory Visit (HOSPITAL_BASED_OUTPATIENT_CLINIC_OR_DEPARTMENT_OTHER): Payer: Medicaid Other | Admitting: Certified Registered"

## 2021-11-19 ENCOUNTER — Telehealth: Payer: Self-pay | Admitting: Cardiology

## 2021-11-19 ENCOUNTER — Ambulatory Visit (HOSPITAL_COMMUNITY): Payer: Medicaid Other | Admitting: Certified Registered"

## 2021-11-19 ENCOUNTER — Ambulatory Visit (HOSPITAL_COMMUNITY)
Admission: RE | Admit: 2021-11-19 | Discharge: 2021-11-19 | Disposition: A | Discharge status: Home / Self Care | Payer: Medicaid Other | Attending: Gastroenterology | Admitting: Gastroenterology

## 2021-11-19 ENCOUNTER — Encounter (HOSPITAL_COMMUNITY)
Admission: RE | Disposition: A | Discharge status: Home / Self Care | Payer: Self-pay | Source: Home / Self Care | Attending: Gastroenterology

## 2021-11-19 DIAGNOSIS — K5730 Diverticulosis of large intestine without perforation or abscess without bleeding: Secondary | ICD-10-CM | POA: Diagnosis not present

## 2021-11-19 DIAGNOSIS — I130 Hypertensive heart and chronic kidney disease with heart failure and stage 1 through stage 4 chronic kidney disease, or unspecified chronic kidney disease: Secondary | ICD-10-CM | POA: Diagnosis not present

## 2021-11-19 DIAGNOSIS — Z1211 Encounter for screening for malignant neoplasm of colon: Secondary | ICD-10-CM

## 2021-11-19 DIAGNOSIS — D123 Benign neoplasm of transverse colon: Secondary | ICD-10-CM | POA: Insufficient documentation

## 2021-11-19 DIAGNOSIS — N189 Chronic kidney disease, unspecified: Secondary | ICD-10-CM | POA: Diagnosis not present

## 2021-11-19 DIAGNOSIS — E1122 Type 2 diabetes mellitus with diabetic chronic kidney disease: Secondary | ICD-10-CM | POA: Diagnosis not present

## 2021-11-19 DIAGNOSIS — Z1212 Encounter for screening for malignant neoplasm of rectum: Secondary | ICD-10-CM | POA: Diagnosis not present

## 2021-11-19 DIAGNOSIS — R195 Other fecal abnormalities: Secondary | ICD-10-CM | POA: Diagnosis not present

## 2021-11-19 DIAGNOSIS — I110 Hypertensive heart disease with heart failure: Secondary | ICD-10-CM

## 2021-11-19 DIAGNOSIS — D122 Benign neoplasm of ascending colon: Secondary | ICD-10-CM | POA: Diagnosis not present

## 2021-11-19 DIAGNOSIS — I509 Heart failure, unspecified: Secondary | ICD-10-CM | POA: Insufficient documentation

## 2021-11-19 DIAGNOSIS — K635 Polyp of colon: Secondary | ICD-10-CM | POA: Diagnosis not present

## 2021-11-19 DIAGNOSIS — I5022 Chronic systolic (congestive) heart failure: Secondary | ICD-10-CM

## 2021-11-19 DIAGNOSIS — F17210 Nicotine dependence, cigarettes, uncomplicated: Secondary | ICD-10-CM | POA: Insufficient documentation

## 2021-11-19 DIAGNOSIS — J449 Chronic obstructive pulmonary disease, unspecified: Secondary | ICD-10-CM | POA: Diagnosis not present

## 2021-11-19 HISTORY — PX: POLYPECTOMY: SHX5525

## 2021-11-19 HISTORY — PX: COLONOSCOPY WITH PROPOFOL: SHX5780

## 2021-11-19 LAB — GLUCOSE, CAPILLARY: Glucose-Capillary: 101 mg/dL — ABNORMAL HIGH (ref 70–99)

## 2021-11-19 SURGERY — COLONOSCOPY WITH PROPOFOL
Anesthesia: Monitor Anesthesia Care

## 2021-11-19 MED ORDER — PROPOFOL 500 MG/50ML IV EMUL
INTRAVENOUS | Status: DC | PRN
  Administered 2021-11-19: 100 ug/kg/min via INTRAVENOUS

## 2021-11-19 MED ORDER — PROPOFOL 10 MG/ML IV BOLUS
INTRAVENOUS | Status: DC | PRN
  Administered 2021-11-19: 50 mg via INTRAVENOUS
  Administered 2021-11-19: 20 mg via INTRAVENOUS

## 2021-11-19 MED ORDER — ALBUTEROL SULFATE HFA 108 (90 BASE) MCG/ACT IN AERS
INHALATION_SPRAY | RESPIRATORY_TRACT | Status: DC | PRN
  Administered 2021-11-19: 2 via RESPIRATORY_TRACT

## 2021-11-19 MED ORDER — LIDOCAINE 2% (20 MG/ML) 5 ML SYRINGE
INTRAMUSCULAR | Status: DC | PRN
  Administered 2021-11-19: 40 mg via INTRAVENOUS

## 2021-11-19 MED ORDER — ALBUTEROL SULFATE HFA 108 (90 BASE) MCG/ACT IN AERS
2.0000 | INHALATION_SPRAY | Freq: Once | RESPIRATORY_TRACT | Status: DC
  Filled 2021-11-19: qty 6.7

## 2021-11-19 MED ORDER — SODIUM CHLORIDE 0.9 % IV SOLN: INTRAVENOUS | Status: DC

## 2021-11-19 SURGICAL SUPPLY — 22 items

## 2021-11-19 NOTE — Telephone Encounter (Signed)
  Please advise patient that I do not personally prescribe or refill Cialis in my practice.     This medication is either prescribed by primary care provider or urologist if indication for Cialis is ED.   Ty  BA

## 2021-11-19 NOTE — H&P (Signed)
[facility] History and Physical   Primary Care Physician:  [name], MD   Reason for Procedure:   Positive Cologuard  Plan:    Colonoscopy     HPI: Chris [name] is a 61 y.o. male undergoing colonoscopy after a positive Cologuard test.  He was having problems with burning abdominal pain but this has resolved.  He does continue to have bloating.  He has no family history of colon cancer.  No overt GI bleeding.  Past Medical History:  Diagnosis Date   Arrhythmia    atrial flutter   CHF (congestive heart failure) (HCC)    Chronic kidney disease    COPD (chronic obstructive pulmonary disease) ([place])    Coronary artery disease    Depression    Diabetes mellitus without complication ([place])    GERD (gastroesophageal reflux disease)    Gout    Hypertension    Influenza A with respiratory manifestations    Mental disorder     Past Surgical History:  Procedure Laterality Date   ANKLE SURGERY     CARDIAC CATHETERIZATION     CARDIOVERSION N/A 07/08/2020   Procedure: CARDIOVERSION;  Surgeon: Corey Skains, MD;  Location: [facility] ORS;  Service: Cardiovascular;  Laterality: N/A;   HERNIA REPAIR     x2   SHOULDER SURGERY     TEE WITHOUT CARDIOVERSION N/A 07/08/2020   Procedure: TRANSESOPHAGEAL ECHOCARDIOGRAM (TEE);  Surgeon: Corey Skains, MD;  Location: [facility] ORS;  Service: Cardiovascular;  Laterality: N/A;    Prior to Admission medications   Medication Sig Start Date End Date Taking? Authorizing Provider  albuterol (PROVENTIL) (2.5 MG/3ML) 0.083% nebulizer solution TAKE 3 MLS BY NEBULIZATION EVERY 6 (SIX) HOURS AS NEEDED FOR SHORTNESS OF BREATH. 10/09/21 10/09/22 Yes [name], MD  albuterol (VENTOLIN HFA) 108 (90 Base) MCG/ACT inhaler Inhale 2 puffs into the lungs every 6 (six) hours as needed for wheezing or shortness of breath. 05/21/21  Yes [name], MD  allopurinol (ZYLOPRIM) 100 MG tablet TAKE 2 TABLETS (200 MG TOTAL) BY MOUTH DAILY.  11/30/20 11/30/21 Yes [name], MD  aspirin 81 MG EC tablet Take 1 tablet (81 mg total) by mouth daily. 11/30/20  Yes [name], MD  atorvastatin (LIPITOR) 40 MG tablet Take 1 tablet (40 mg total) by mouth daily. 11/30/20  Yes [name], MD  budesonide-formoterol [facility]) 160-4.5 MCG/ACT inhaler Inhale 2 puffs into the lungs 2 (two) times daily. 11/30/20  Yes [name], MD  carvedilol (COREG) 6.25 MG tablet Take 1 tablet (6.25 mg total) by mouth 2 (two) times daily. 12/01/20  Yes [name], FNP  colchicine 0.6 MG tablet Take 2 tabs (1.2 mg) at the onset of a gout flare, may repeat 1 tab (0.6 mg) after 2 hours if symptoms persist. 10/09/21  Yes [name], MD  dapagliflozin propanediol (FARXIGA) 10 MG TABS tablet Take 1 tablet (10 mg total) by mouth daily. 12/01/20  Yes [name], FNP  glipiZIDE (GLUCOTROL) 5 MG tablet Take 0.5 tablets (2.5 mg total) by mouth daily before breakfast. 08/20/21  Yes [name], MD  isosorbide-hydrALAZINE (BIDIL) 20-37.5 MG tablet Take 1 tablet by mouth 3 (three) times daily. 08/20/21  Yes [name], MD  montelukast (SINGULAIR) 10 MG tablet TAKE 1 TABLET (10 MG TOTAL) BY MOUTH AT BEDTIME. 05/15/21 05/15/22 Yes [name], MD  predniSONE (DELTASONE) 20 MG tablet Take 2 tablets (40 mg total) by mouth daily.  10/29/21  Yes [name], PA-C  sennosides-docusate sodium (SENOKOT-S) 8.6-50 MG tablet Take 1 tablet by mouth daily as needed for constipation. 08/20/21  Yes [name], MD  Accu-Chek Softclix Lancets lancets Use as instructed Patient not taking: Reported on 11/16/2021 12/12/20   [name], MD  apixaban (ELIQUIS) 5 MG TABS tablet TAKE 1 TABLET (5 MG TOTAL) BY MOUTH 2 (TWO) TIMES DAILY. 12/01/20 12/01/21  [name], MD  Blood Glucose Monitoring Suppl (ACCU-CHEK GUIDE) w/Device KIT Use as directed Patient not taking: Reported on 11/16/2021 12/12/20   [name], MD   glucose blood (ACCU-CHEK GUIDE) test strip Use as directed to check blood sugar 1-2 times a day Patient not taking: Reported on 11/16/2021 12/12/20   [name], MD  torsemide (DEMADEX) 20 MG tablet Take 2 tablets (40 mg total) by mouth daily. 07/10/21 11/16/21  [name], MD  metoprolol tartrate (LOPRESSOR) 100 MG tablet Take 1 tablet (100 mg total) by mouth 2 (two) times daily. 07/09/20 07/11/20  [name], MD    Current Facility-Administered Medications  Medication Dose Route Frequency Provider Last Rate Last Admin   0.9 %  sodium chloride infusion   Intravenous Continuous Daryel November, MD 20 mL/hr at 11/19/21 0717 New Bag at 11/19/21 0717    Allergies as of 10/19/2021   (No Known Allergies)    Family History  Problem Relation Age of Onset   Heart disease Father    Diabetes Mother    HIV Brother    Healthy Son    Healthy Daughter     Social History   Socioeconomic History   Marital status: Divorced    Spouse name: Not on file   Number of children: 3   Years of education: Not on file   Highest education level: High school graduate  Occupational History   Occupation: disability  Tobacco Use   Smoking status: Every Day    Packs/day: 1.00    Years: 43.00    Total pack years: 43.00    Types: Cigarettes   Smokeless tobacco: Never   Tobacco comments:    4 cigarettes/day x 2 month (04/14/2021)  Vaping Use   Vaping Use: Never used  Substance and Sexual Activity   Alcohol use: No   Drug use: Yes    Frequency: 21.0 times per week    Types: Marijuana, Cocaine    Comment: last use Cocaine- 03/28/2021. Still using marijuana   Sexual activity: Not on file  Other Topics Concern   Not on file  Social History Narrative   ** Merged History Encounter **       Social Determinants of Health   Financial Resource Strain: Medium Risk (06/19/2021)   Overall Financial Resource Strain (CARDIA)    Difficulty of Paying Living Expenses: Somewhat hard  Food  Insecurity: No Food Insecurity (04/14/2021)   Hunger Vital Sign    Worried About Running Out of Food in the Last Year: Never true    Ran Out of Food in the Last Year: Never true  Transportation Needs: Unmet Transportation Needs (10/13/2021)   PRAPARE - Hydrologist (Medical): Yes    Lack of Transportation (Non-Medical): Yes  Physical Activity: Insufficiently Active (11/16/2021)   Exercise Vital Sign    Days of Exercise per Week: 5 days    Minutes of Exercise per Session: 20 min  Stress: Not on file  Social Connections: Moderately Isolated (04/28/2021)   Social Connection and Isolation  Panel [NHANES]    Frequency of Communication with Friends and Family: More than three times a week    Frequency of Social Gatherings with Friends and Family: Once a week    Attends Religious Services: More than 4 times per year    Active Member of [employer] or Organizations: No    Attends Archivist Meetings: Never    Marital Status: Divorced  Human resources officer Violence: Not on file    Review of Systems:  All other review of systems negative except as mentioned in the HPI.  Physical Exam: Vital signs BP (!) 154/91   Pulse 74   Temp 98.5 F (36.9 C) (Oral)   Resp 20   Ht 6' 1"  (1.854 m)   Wt 122.5 kg   SpO2 96%   BMI 35.62 kg/m   General:   Alert,  Well-developed, well-nourished, pleasant and cooperative in NAD Airway:  Mallampati 2 Lungs:  Clear throughout to auscultation.   Heart:  Regular rate and rhythm; no murmurs, clicks, rubs,  or gallops. Abdomen:  Soft, nontender and nondistended. Normal bowel sounds.   Neuro/Psych:  Normal mood and affect. A and O x 3   Aleesha Ringstad E. Candis Schatz, MD [facility]

## 2021-11-19 NOTE — Telephone Encounter (Signed)
Spoke w/ Chris [name].  Advised him that we do not have cialis samples and I do not see it on his current or past med list. He reports that his previous cardiologist, Dr. Clayborn Bigness, previously gave him samples, but he has not seen him in over a year. Advised Chris [name] that I will need to check w/ Dr. Garen Lah, as this is a new med for him. He asked if this can be sent in quickly and someone call once the rx is sent in.

## 2021-11-19 NOTE — Transfer of Care (Signed)
Immediate Anesthesia Transfer of Care Note  Patient: Chris [name]  Procedure(s) Performed: COLONOSCOPY WITH PROPOFOL POLYPECTOMY  Patient Location: PACU  Anesthesia Type:MAC  Level of Consciousness: awake, alert , oriented and patient cooperative  Airway & Oxygen Therapy: Patient Spontanous Breathing and Patient connected to face mask oxygen  Post-op Assessment: Report given to RN and Post -op Vital signs reviewed and stable  Post vital signs: Reviewed and stable  Last Vitals:  Vitals Value Taken Time  BP 142/92 11/19/21 0805  Temp 36.8 C 11/19/21 0805  Pulse 82 11/19/21 0805  Resp 16 11/19/21 0805  SpO2 99 % 11/19/21 0805    Last Pain:  Vitals:   11/19/21 0805  TempSrc: Temporal  PainSc: Asleep         Complications: No notable events documented.

## 2021-11-19 NOTE — Anesthesia Preprocedure Evaluation (Signed)
Anesthesia Evaluation  Patient identified by MRN, date of birth, ID band Patient awake    Reviewed: Allergy & Precautions, NPO status , Patient's Chart, lab work & pertinent test results  Airway Mallampati: II  TM Distance: >3 FB Neck ROM: Full    Dental no notable dental hx.    Pulmonary COPD, Current Smoker,    Pulmonary exam normal breath sounds clear to auscultation       Cardiovascular hypertension, +CHF  Normal cardiovascular exam Rhythm:Regular Rate:Normal     Neuro/Psych negative neurological ROS  negative psych ROS   GI/Hepatic negative GI ROS, (+)     substance abuse  cocaine use,   Endo/Other  diabetes  Renal/GU negative Renal ROS  negative genitourinary   Musculoskeletal negative musculoskeletal ROS (+)   Abdominal   Peds negative pediatric ROS (+)  Hematology negative hematology ROS (+)   Anesthesia Other Findings   Reproductive/Obstetrics negative OB ROS                             Anesthesia Physical Anesthesia Plan  ASA: 4  Anesthesia Plan: MAC   Post-op Pain Management: Minimal or no pain anticipated   Induction: Intravenous  PONV Risk Score and Plan: 1 and Propofol infusion and Treatment may vary due to age or medical condition  Airway Management Planned: Simple Face Mask  Additional Equipment:   Intra-op Plan:   Post-operative Plan:   Informed Consent: I have reviewed the patients History and Physical, chart, labs and discussed the procedure including the risks, benefits and alternatives for the proposed anesthesia with the patient or authorized representative who has indicated his/her understanding and acceptance.     Dental advisory given  Plan Discussed with: CRNA and Surgeon  Anesthesia Plan Comments:         Anesthesia Quick Evaluation

## 2021-11-19 NOTE — Anesthesia Postprocedure Evaluation (Signed)
Anesthesia Post Note  Patient: Chris [name]  Procedure(s) Performed: COLONOSCOPY WITH PROPOFOL POLYPECTOMY     Patient location during evaluation: PACU Anesthesia Type: MAC Level of consciousness: awake and alert Pain management: pain level controlled Vital Signs Assessment: post-procedure vital signs reviewed and stable Respiratory status: spontaneous breathing, nonlabored ventilation, respiratory function stable and patient connected to nasal cannula oxygen Cardiovascular status: stable and blood pressure returned to baseline Postop Assessment: no apparent nausea or vomiting Anesthetic complications: no   No notable events documented.  Last Vitals:  Vitals:   11/19/21 0815 11/19/21 0825  BP: (!) 146/99 (!) 175/107  Pulse: 82 75  Resp: (!) 22 20  Temp:    SpO2: 97% 99%    Last Pain:  Vitals:   11/19/21 0825  TempSrc:   PainSc: 0-No pain                 Tonisha Silvey S

## 2021-11-19 NOTE — Telephone Encounter (Signed)
Patient calling the office for samples of medication:   1.  What medication and dosage are you requesting samples for? Cialis  2.  Are you currently out of this medication? yes

## 2021-11-19 NOTE — Telephone Encounter (Signed)
Attempted to contact pt, but no answer and vm is full x 2 attempts.  Will attempt again later.

## 2021-11-19 NOTE — Discharge Instructions (Addendum)
RESUME ELIQUIS TOMORROW  YOU HAD AN ENDOSCOPIC PROCEDURE TODAY: Refer to the procedure report and other information in the discharge instructions given to you for any specific questions about what was found during the examination. If this information does not answer your questions, please call [place] office at [phone] to clarify.   YOU SHOULD EXPECT: Some feelings of bloating in the abdomen. Passage of more gas than usual. Walking can help get rid of the air that was put into your GI tract during the procedure and reduce the bloating. If you had a lower endoscopy (such as a colonoscopy or flexible sigmoidoscopy) you may notice spotting of blood in your stool or on the toilet paper. Some abdominal soreness may be present for a day or two, also.  DIET: Your first meal following the procedure should be a light meal and then it is ok to progress to your normal diet. A half-sandwich or bowl of soup is an example of a good first meal. Heavy or fried foods are harder to digest and may make you feel nauseous or bloated. Drink plenty of fluids but you should avoid alcoholic beverages for 24 hours.   ACTIVITY: Your care partner should take you home directly after the procedure. You should plan to take it easy, moving slowly for the rest of the day. You can resume normal activity the day after the procedure however YOU SHOULD NOT DRIVE, use power tools, machinery or perform tasks that involve climbing or major physical exertion for 24 hours (because of the sedation medicines used during the test).   SYMPTOMS TO REPORT IMMEDIATELY: A gastroenterologist can be reached at any hour. Please call [phone]  for any of the following symptoms:  Following lower endoscopy (colonoscopy, flexible sigmoidoscopy) Excessive amounts of blood in the stool  Significant tenderness, worsening of abdominal pains  Swelling of the abdomen that is new, acute  Fever of 100 or higher    FOLLOW UP:  If any biopsies were  taken you will be contacted by phone or by letter within the next 1-3 weeks. Call [phone]  if you have not heard about the biopsies in 3 weeks.  Please also call with any specific questions about appointments or follow up tests.

## 2021-11-19 NOTE — Telephone Encounter (Signed)
Attempted again to contact pt.  No answer, vm box is full, unable to leave message.

## 2021-11-19 NOTE — Op Note (Signed)
[facility] Patient Name: Chris [name] Procedure Date: 11/19/2021 MRN: [id number] Attending MD: Dub Amis. Tomasa Rand , MD Date of Birth: 12-12-1960 CSN: [id number] Age: 61 Admit Type: Outpatient Procedure:                Colonoscopy Indications:              Positive Cologuard test Providers:                Lorin Picket E. Tomasa Rand, MD, Rip Harbour, RN, Wanita Chamberlain, Technician Referring MD:              Medicines:                Monitored Anesthesia Care Complications:            No immediate complications. Estimated Blood Loss:     Estimated blood loss was minimal. Procedure:                Pre-Anesthesia Assessment:                           - Prior to the procedure, a History and Physical                            was performed, and patient medications and                            allergies were reviewed. The patient's tolerance of                            previous anesthesia was also reviewed. The risks                            and benefits of the procedure and the sedation                            options and risks were discussed with the patient.                            All questions were answered, and informed consent                            was obtained. Prior Anticoagulants: The patient has                            taken Eliquis (apixaban), last dose was 2 days                            prior to procedure. ASA Grade Assessment: III - A                            patient with severe systemic disease. After  reviewing the risks and benefits, the patient was                            deemed in satisfactory condition to undergo the                            procedure.                           After obtaining informed consent, the colonoscope                            was passed under direct vision. Throughout the                            procedure, the patient's blood pressure, pulse, and                             oxygen saturations were monitored continuously. The                            CF-HQ190L ([id number]) Olympus colonoscope was                            introduced through the anus and advanced to the the                            cecum, identified by appendiceal orifice and                            ileocecal valve. The colonoscopy was performed                            without difficulty. The patient tolerated the                            procedure well. The quality of the bowel                            preparation was adequate. The ileocecal valve,                            appendiceal orifice, and rectum were photographed. Scope In: 7:43:05 AM Scope Out: 8:01:33 AM Scope Withdrawal Time: 0 hours 8 minutes 34 seconds  Total Procedure Duration: 0 hours 18 minutes 28 seconds  Findings:      The perianal and digital rectal examinations were normal. Pertinent       negatives include normal sphincter tone and no palpable rectal lesions.      A 3 mm polyp was found in the hepatic flexure. The polyp was sessile.       The polyp was removed with a cold snare. Resection and retrieval were       complete. Estimated blood loss was minimal.      Many small and large-mouthed diverticula were found in the sigmoid  colon, descending colon and transverse colon. There was narrowing of the       colon in association with the diverticular opening. There was evidence       of an impacted diverticulum.      The exam was otherwise normal throughout the examined colon.      The retroflexed view of the distal rectum and anal verge was normal and       showed no anal or rectal abnormalities. Impression:               - One 3 mm polyp at the hepatic flexure, removed                            with a cold snare. Resected and retrieved.                           - Severe diverticulosis in the sigmoid colon, in                            the descending colon and in the  transverse colon.                            There was narrowing of the colon in association                            with the diverticular opening. There was evidence                            of an impacted diverticulum.                           - The distal rectum and anal verge are normal on                            retroflexion view.                           - The Cologuard test was likely a false positive                            test. Moderate Sedation:      Not Applicable - Patient had care per Anesthesia. Recommendation:           - Patient has a contact number available for                            emergencies. The signs and symptoms of potential                            delayed complications were discussed with the                            patient. Return to normal activities tomorrow.                            Written  discharge instructions were provided to the                            patient.                           - Resume previous diet.                           - Continue present medications.                           - Await pathology results.                           - Repeat colonoscopy (date not yet determined) for                            surveillance based on pathology results.                           - Recommend daily fiber supplementation to reduce                            risk of diverticular complications                           - Okay to resume Eliquis tomorrow. Procedure Code(s):        --- Professional ---                           [phone], Colonoscopy, flexible; with removal of                            tumor(s), polyp(s), or other lesion(s) by snare                            technique Diagnosis Code(s):        --- Professional ---                           K63.5, Polyp of colon                           R19.5, Other fecal abnormalities                           K57.30, Diverticulosis of large intestine without                             perforation or abscess without bleeding CPT copyright 2019 [employer]. All rights reserved. The codes documented in this report are preliminary and upon coder review may  be revised to meet current compliance requirements. Toryn Mcclinton E. Tomasa Rand, MD 11/19/2021 8:07:55 AM This report has been signed electronically. Number of Addenda: 0

## 2021-11-20 LAB — SURGICAL PATHOLOGY

## 2021-11-21 ENCOUNTER — Other Ambulatory Visit: Payer: Self-pay

## 2021-11-21 ENCOUNTER — Encounter: Payer: Self-pay | Admitting: Emergency Medicine

## 2021-11-21 ENCOUNTER — Emergency Department
Admission: EM | Admit: 2021-11-21 | Discharge: 2021-11-21 | Disposition: A | Discharge status: Home / Self Care | Payer: Medicaid Other | Attending: Emergency Medicine | Admitting: Emergency Medicine

## 2021-11-21 DIAGNOSIS — B309 Viral conjunctivitis, unspecified: Secondary | ICD-10-CM | POA: Diagnosis not present

## 2021-11-21 DIAGNOSIS — H5789 Other specified disorders of eye and adnexa: Secondary | ICD-10-CM | POA: Diagnosis present

## 2021-11-21 MED ORDER — KETOROLAC TROMETHAMINE 0.5 % OP SOLN: 1.0000 [drp] | Freq: Four times a day (QID) | OPHTHALMIC | 0 refills | Status: DC

## 2021-11-21 MED ORDER — ERYTHROMYCIN 5 MG/GM OP OINT
TOPICAL_OINTMENT | Freq: Four times a day (QID) | OPHTHALMIC | Status: DC
  Administered 2021-11-21: 1 via OPHTHALMIC
  Filled 2021-11-21: qty 1

## 2021-11-21 MED ORDER — GENTAMICIN SULFATE 0.3 % OP SOLN: 1.0000 [drp] | Freq: Three times a day (TID) | OPHTHALMIC | 0 refills | Status: DC

## 2021-11-21 MED ORDER — FLUORESCEIN SODIUM 1 MG OP STRP
1.0000 | ORAL_STRIP | Freq: Once | OPHTHALMIC | Status: AC
  Administered 2021-11-21: 1 via OPHTHALMIC
  Filled 2021-11-21: qty 1

## 2021-11-21 NOTE — ED Notes (Signed)
Pt A&O, pt given discharge instructions, pt ambulating with steady gait. 

## 2021-11-21 NOTE — Discharge Instructions (Addendum)
Use the eyedrops as directed.  Follow-up with your primary provider for ongoing symptoms.

## 2021-11-21 NOTE — ED Triage Notes (Signed)
Pt reports redness and drainage from his right eye for several days.

## 2021-11-21 NOTE — ED Provider Notes (Signed)
[name] Emergency Department Provider Note     Event Date/Time   First MD Initiated Contact with Patient 11/21/21 1844     (approximate)   History   Eye Drainage   HPI  Chris [name] is a 61 y.o. male presents to the ED for evaluation of redness and drainage from his right eye for the last few days.  Patient denies any injury, trauma, or foreign body sensation.  He also denies any nausea, vomiting, dizziness, or headache.     Physical Exam   Triage Vital Signs: ED Triage Vitals  Enc Vitals Group     BP 11/21/21 1714 (!) 106/94     Pulse Rate 11/21/21 1714 83     Resp 11/21/21 1714 18     Temp 11/21/21 1714 97.8 F (36.6 C)     Temp Source 11/21/21 1714 Oral     SpO2 11/21/21 1714 93 %     Weight 11/21/21 1706 268 lb 15.4 oz (122 kg)     Height 11/21/21 1706 '6\' 1"'$  (1.854 m)     Head Circumference --      Peak Flow --      Pain Score 11/21/21 1706 0     Pain Loc --      Pain Edu? --      Excl. in [place]? --     Most recent vital signs: Vitals:   11/21/21 1714 11/21/21 2009  BP: (!) 106/94 (!) 136/115  Pulse: 83 65  Resp: 18 16  Temp: 97.8 F (36.6 C) 97.7 F (36.5 C)  SpO2: 93% 98%    General Awake, no distress. NAD HEENT NCAT. PERRL. EOMI. right eye is moderately injected.  No gross foreign body appreciated.  No fluorescein dye uptake noted.  No rhinorrhea. Mucous membranes are moist.  CV:  Good peripheral perfusion.  RESP:  Normal effort.  ABD:  No distention.    ED Results / Procedures / Treatments   Labs (all labs ordered are listed, but only abnormal results are displayed) Labs Reviewed - No data to display   EKG   RADIOLOGY  No results found.   PROCEDURES:  Critical Care performed: No  Procedures   MEDICATIONS ORDERED IN ED: Medications  erythromycin ophthalmic ointment (1 application  Right Eye Given 11/21/21 1930)  fluorescein ophthalmic strip 1 strip (1 strip Right Eye Given by Other 11/21/21  1930)     IMPRESSION / MDM / ASSESSMENT AND PLAN / ED COURSE  I reviewed the triage vital signs and the nursing notes.                              Differential diagnosis includes, but is not limited to, conjunctivitis, corneal abrasion, corneal foreign body  Patient's presentation is most consistent with acute, uncomplicated illness.  Patient to the ED for evaluation of several days of right eye redness, tearing.  Patient is evaluated for his complaints and found to have reassuring work-up overall.  No fluorescein dye uptake to indicate a corneal injury and no gross foreign bodies noted.  Patient's diagnosis is consistent with conjunctivitis. Patient will be discharged home with prescriptions for Acular and gentamicin. Patient is to follow up with his PCP or [facility] as needed or otherwise directed. Patient is given ED precautions to return to the ED for any worsening or new symptoms.     FINAL CLINICAL IMPRESSION(S) / ED DIAGNOSES  Final diagnoses:  Acute viral conjunctivitis of right eye     Rx / DC Orders   ED Discharge Orders          Ordered    gentamicin (GARAMYCIN) 0.3 % ophthalmic solution  3 times daily        11/21/21 1930    ketorolac (ACULAR) 0.5 % ophthalmic solution  4 times daily        11/21/21 1930             Note:  This document was prepared using Dragon voice recognition software and may include unintentional dictation errors.    Melvenia Needles, PA-C 11/22/21 0011    Carrie Mew, MD 11/22/21 [phone]

## 2021-11-22 ENCOUNTER — Telehealth: Payer: Self-pay | Admitting: Emergency Medicine

## 2021-11-22 MED ORDER — GENTAMICIN SULFATE 0.3 % OP SOLN: 1.0000 [drp] | Freq: Three times a day (TID) | OPHTHALMIC | 0 refills | Status: AC

## 2021-11-22 MED ORDER — KETOROLAC TROMETHAMINE 0.5 % OP SOLN: 1.0000 [drp] | Freq: Four times a day (QID) | OPHTHALMIC | 0 refills | Status: DC

## 2021-11-22 NOTE — Telephone Encounter (Signed)
Pt left ED yestrerday without printed rx. Will re print.

## 2021-11-23 ENCOUNTER — Telehealth: Payer: Self-pay

## 2021-11-23 NOTE — Telephone Encounter (Signed)
Transition Care Management Follow-up Telephone Call Date of discharge and from where: 11/21/2021 from [facility] How have you been since you were released from the hospital? Patient stated that there is still some discomfort. Patient has started the eye drops as rx'ed by the ED. Patient did not have any questions or concerns at this time.  Any questions or concerns? No  Items Reviewed: Did the pt receive and understand the discharge instructions provided? Yes  Medications obtained and verified? Yes  Other? No  Any new allergies since your discharge? No  Dietary orders reviewed? No Do you have support at home? Yes   Functional Questionnaire: (I = Independent and D = Dependent) ADLs: I  Bathing/Dressing- I  Meal Prep- I  Eating- I  Maintaining continence- I  Transferring/Ambulation- I  Managing Meds- I   Follow up appointments reviewed:  PCP Hospital f/u appt confirmed? No   Specialist Hospital f/u appt confirmed? No   Are transportation arrangements needed? No  If their condition worsens, is the pt aware to call PCP or go to the Emergency Dept.? Yes Was the patient provided with contact information for the PCP's office or ED? Yes Was to pt encouraged to call back with questions or concerns? Yes

## 2021-11-24 NOTE — Telephone Encounter (Signed)
Closing encouter

## 2021-11-25 NOTE — Progress Notes (Signed)
Chris [name],  The polyp which I removed during your recent procedure was proven to be completely benign but is considered a "pre-cancerous" polyp that MAY have grown into cancer if it had not been removed.  Studies shows that at least 20% of women over age 59 and 30% of men over age 93 have pre-cancerous polyps.  Based on current nationally recognized surveillance guidelines, I recommend that you have a repeat colonoscopy in 7 years.   If you develop any new rectal bleeding, abdominal pain or significant bowel habit changes, please contact me before then.

## 2021-11-30 ENCOUNTER — Telehealth: Payer: Self-pay | Admitting: Cardiology

## 2021-11-30 ENCOUNTER — Other Ambulatory Visit: Payer: Self-pay

## 2021-11-30 NOTE — Telephone Encounter (Signed)
Patient is calling back due to not hearing back from a nurse yet. Please advise.

## 2021-11-30 NOTE — Telephone Encounter (Signed)
Pt states that he needs to speak with a nurse. He did not say what it was in regards to. Please advise

## 2021-11-30 NOTE — Telephone Encounter (Signed)
Called patient back and he wanted to know if we could prescribe Cialis for him. I informed him that we had tried to call him multiple times after his phone call on 11/19/21 but were unable to reach him. I reviewed Dr. Thereasa Solo recommendation as charted below with the patient:  Please advise patient that I do not personally prescribe or refill Cialis in my practice.     This medication is either prescribed by primary care provider or urologist if indication for Cialis is ED.   Ty  BA   Patient verbalized understanding and agreed with plan.

## 2021-12-01 ENCOUNTER — Other Ambulatory Visit: Payer: Self-pay

## 2021-12-04 IMAGING — DX DG CHEST 1V PORT
1 series · 1 of 1 positions shown · non-contrast
Comparison: 07/03/2020

CLINICAL DATA: Chest pain and shortness of breath.

EXAM:
PORTABLE CHEST 1 VIEW

[chest ap]
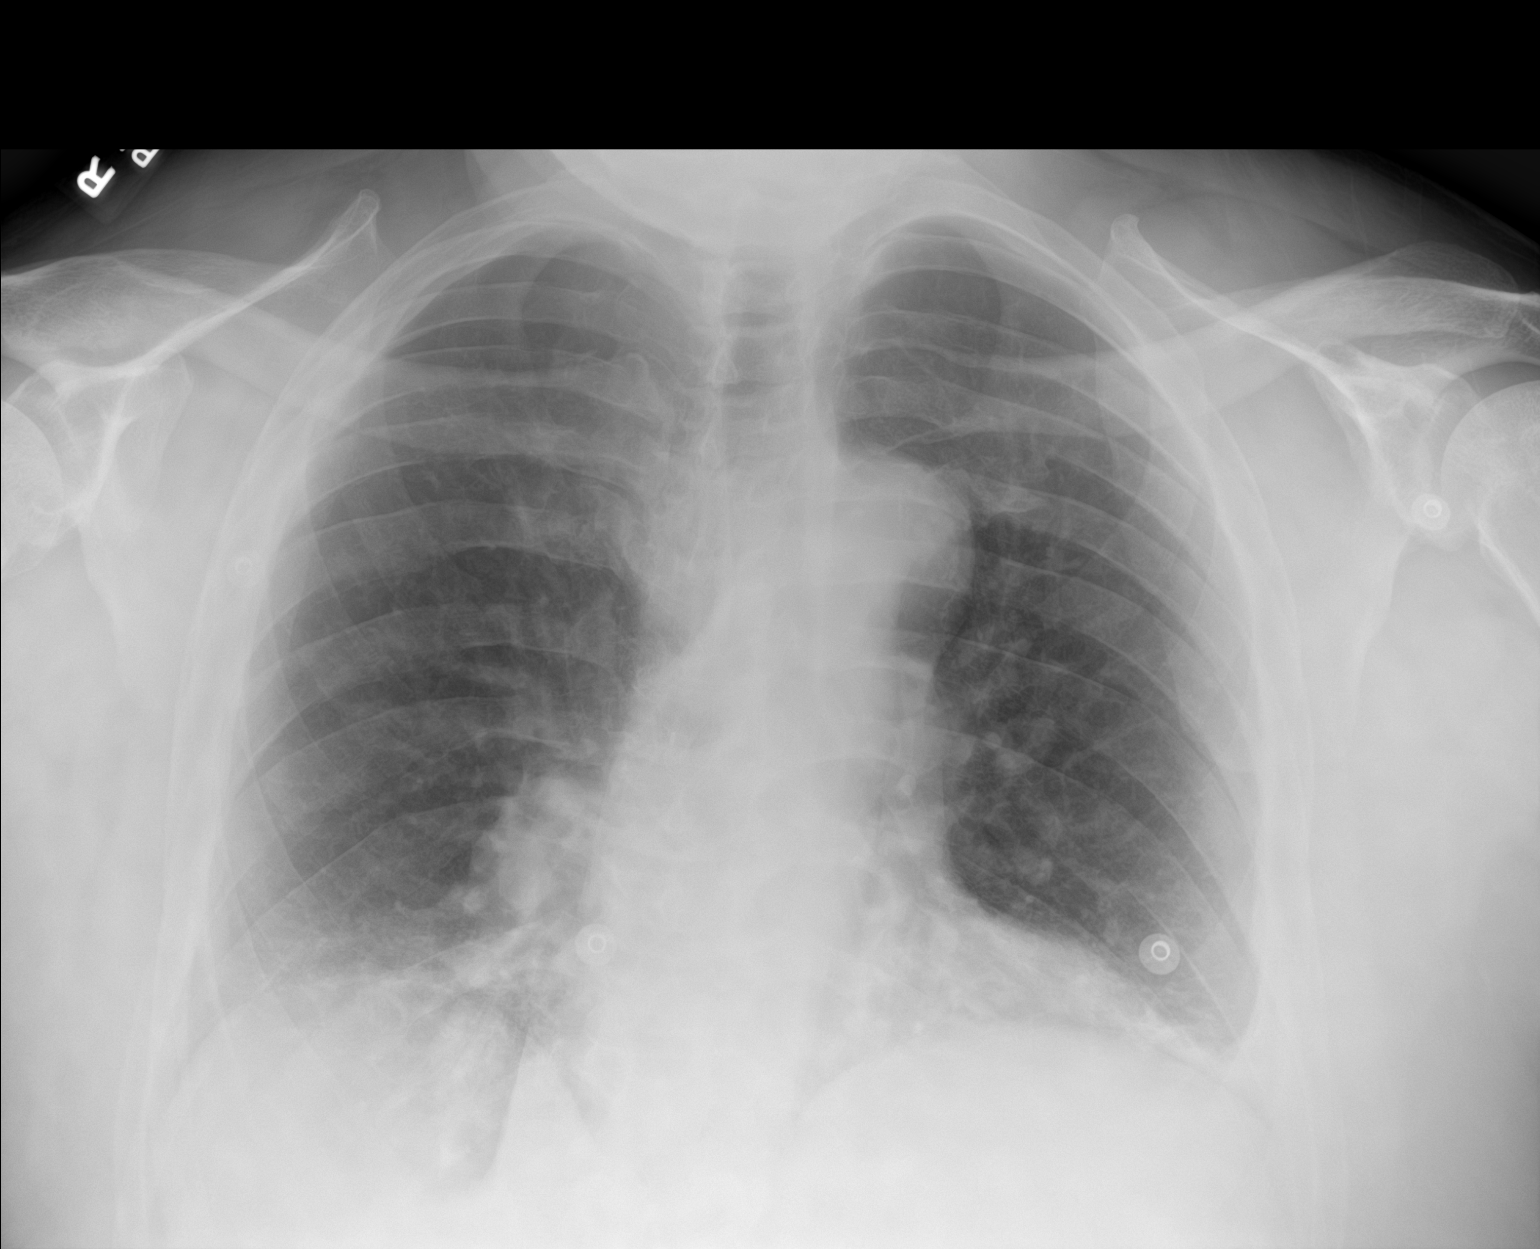

[1 of 1 positions shown; findings below may reference images not displayed]

FINDINGS: 8420 hours. Low volume film with bibasilar atelectasis, similar to
prior. Superimposed infection at the bases can not be excluded by
imaging. Probable tiny left effusion. There is pulmonary vascular
congestion without overt pulmonary edema. Cardiopericardial
silhouette is at upper limits of normal for size. The visualized
bony structures of the thorax show no acute abnormality.
IMPRESSION: 1. Low volume film with bibasilar atelectasis and probable tiny left
effusion.
2. Pulmonary vascular congestion without overt pulmonary edema.

## 2021-12-17 ENCOUNTER — Other Ambulatory Visit: Payer: Self-pay

## 2021-12-21 ENCOUNTER — Other Ambulatory Visit: Payer: Self-pay

## 2021-12-21 ENCOUNTER — Ambulatory Visit: Payer: Medicaid Other | Attending: Internal Medicine | Admitting: Internal Medicine

## 2021-12-21 ENCOUNTER — Encounter: Payer: Self-pay | Admitting: Internal Medicine

## 2021-12-21 VITALS — BP 138/80 | HR 73 | Temp 97.8°F | Resp 20 | Wt 265.0 lb

## 2021-12-21 DIAGNOSIS — F17210 Nicotine dependence, cigarettes, uncomplicated: Secondary | ICD-10-CM

## 2021-12-21 DIAGNOSIS — E1159 Type 2 diabetes mellitus with other circulatory complications: Secondary | ICD-10-CM | POA: Diagnosis not present

## 2021-12-21 DIAGNOSIS — N1832 Chronic kidney disease, stage 3b: Secondary | ICD-10-CM

## 2021-12-21 DIAGNOSIS — Z6834 Body mass index (BMI) 34.0-34.9, adult: Secondary | ICD-10-CM

## 2021-12-21 DIAGNOSIS — E1169 Type 2 diabetes mellitus with other specified complication: Secondary | ICD-10-CM

## 2021-12-21 DIAGNOSIS — I5042 Chronic combined systolic (congestive) and diastolic (congestive) heart failure: Secondary | ICD-10-CM

## 2021-12-21 DIAGNOSIS — K5901 Slow transit constipation: Secondary | ICD-10-CM

## 2021-12-21 DIAGNOSIS — F17200 Nicotine dependence, unspecified, uncomplicated: Secondary | ICD-10-CM

## 2021-12-21 DIAGNOSIS — Z23 Encounter for immunization: Secondary | ICD-10-CM

## 2021-12-21 DIAGNOSIS — I152 Hypertension secondary to endocrine disorders: Secondary | ICD-10-CM

## 2021-12-21 DIAGNOSIS — E669 Obesity, unspecified: Secondary | ICD-10-CM

## 2021-12-21 LAB — GLUCOSE, POCT (MANUAL RESULT ENTRY): POC Glucose: 103 mg/dl — AB (ref 70–99)

## 2021-12-21 LAB — POCT GLYCOSYLATED HEMOGLOBIN (HGB A1C): HbA1c, POC (prediabetic range): 6.2 % (ref 5.7–6.4)

## 2021-12-21 MED ORDER — POLYETHYLENE GLYCOL 3350 17 GM/SCOOP PO POWD
17.0000 g | Freq: Every day | ORAL | 1 refills | Status: DC | PRN
  Filled 2021-12-21 (×2): qty 510, 30d supply, fill #0
  Filled 2022-02-12: qty 510, 30d supply, fill #1

## 2021-12-21 NOTE — Progress Notes (Signed)
Follow up DM

## 2021-12-21 NOTE — Progress Notes (Signed)
Patient ID: Chris [name], male    DOB: 1961-04-07  MRN: [id number]  CC: Diabetes   Subjective: Chris [name] is a 61 y.o. male who presents for chronic ds management His concerns today include:  Pt with hx of NICM EF 25-35% on nuclear ST 07/2021,  combined CHF, HTN, DM type 2, HL, CKD 3b, gout, tob dep, subst abuse (cocaine),  COPD, chronic resp failure with hypoxia was on home O2,. OSA  C/o constipation. Has b/m daily but feels he is not passing enough stools.  Prescribed Senokot-Colace on last visit but this was not helpful. -Had colonoscopy 11/20/2021.  Found to have severe diverticulosis throughout the colon.  1 adenomatous polyp removed.  CHF/A.fib/HTN: Denies any chest pains, shortness of breath, no lower extremity edema, PND orthopnea, palpitations.  Reports compliance with taking medications including BiDil 3 times a day, carvedilol twice a day, Farxiga, torsemide 40 mg. Saw his cardiologist Dr. Garen Lah 10/2021.  Plan for repeat echo by next visit.  CKD 3:  no showed with [facility] so they refused to schedule another appointment.  Patient states he will try calling them himself to see if he can get in.  Tob dep: On last visit he was smoking 1/4 pack of cigarettes per day.  Now down to 1 pk/mth.  Working on trying to quit.  DM Results for orders placed or performed in visit on 12/21/21  Glucose (CBG)  Result Value Ref Range   POC Glucose 103 (A) 70 - 99 mg/dl  HgB A1c  Result Value Ref Range   Hemoglobin A1C     HbA1c POC (<> result, manual entry)     HbA1c, POC (prediabetic range) 6.2 5.7 - 6.4 %   HbA1c, POC (controlled diabetic range)     Checks BS QOD.  Gives range 100-120 Admits that he already eats.  He avoids sugary drinks.  Drinks sweet tea every now and then. Does some weightlifting but no aerobics for exercise. He still did not get in with Dr. Katy Fitch for his eye exam.  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   Patient Active Problem  List   Diagnosis Date Noted   Right kidney mass 05/14/2021   CHF exacerbation ([place]) 04/14/2021   Chest pain 04/14/2021   Syncope 04/14/2021   Left-sided weakness 10/28/2020   COPD exacerbation (HCC)    Typical atrial flutter (HCC)    CHF (congestive heart failure) ([place]) 07/04/2020   Acute exacerbation of CHF (congestive heart failure) ([place]) 06/16/2020   COPD with acute exacerbation ([place]) 06/16/2020   Influenza vaccine refused 05/06/2020   Acute on chronic combined systolic (congestive) and diastolic (congestive) heart failure ([place]) 05/05/2020   Acute decompensated heart failure ([place]) 05/04/2020   Illiteracy 05/04/2020   Type 2 diabetes mellitus with stage 3 chronic kidney disease ([place]) 12/25/2019   Acute on chronic combined systolic and diastolic CHF (congestive heart failure) ([place]) 10/26/2019   Elevated troponin I level 10/26/2019   Acute on chronic diastolic (congestive) heart failure ([place]) 10/26/2019   History of gout 02/01/2019   Seasonal allergic rhinitis due to pollen 02/01/2019   Tobacco dependence 11/30/2018   Microscopic hematuria 11/30/2018   Depression 11/30/2018   Difficulty controlling anger 11/30/2018   CAP (community acquired pneumonia) 08/11/2018   COPD (chronic obstructive pulmonary disease) (HCC)    CKD (chronic kidney disease) stage 3, GFR 30-59 ml/min ([place]) 08/10/2018   Recurrent epistaxis 04/21/2018   Mixed hyperlipidemia 07/28/2017   Essential hypertension 07/28/2017  Chronic systolic heart failure ([place]) 10/25/2014   Cocaine abuse ([place]) 02/20/2013   Cannabis abuse 02/20/2013   Back pain, chronic 02/20/2013     Current Outpatient Medications on File Prior to Visit  Medication Sig Dispense Refill   Accu-Chek Softclix Lancets lancets Use as instructed (Patient not taking: Reported on 11/16/2021) 100 each 12   albuterol (PROVENTIL) (2.5 MG/3ML) 0.083% nebulizer solution TAKE 3 MLS BY NEBULIZATION EVERY 6 (SIX) HOURS AS NEEDED FOR SHORTNESS OF BREATH. 90 mL 1    albuterol (VENTOLIN HFA) 108 (90 Base) MCG/ACT inhaler Inhale 2 puffs into the lungs every 6 (six) hours as needed for wheezing or shortness of breath. 18 g 12   allopurinol (ZYLOPRIM) 100 MG tablet TAKE 2 TABLETS (200 MG TOTAL) BY MOUTH DAILY. 180 tablet 3   aspirin 81 MG EC tablet Take 1 tablet (81 mg total) by mouth daily. 100 tablet 2   atorvastatin (LIPITOR) 40 MG tablet Take 1 tablet (40 mg total) by mouth daily. 90 tablet 3   Blood Glucose Monitoring Suppl (ACCU-CHEK GUIDE) w/Device KIT Use as directed (Patient not taking: Reported on 11/16/2021) 1 kit 0   budesonide-formoterol (SYMBICORT) 160-4.5 MCG/ACT inhaler Inhale 2 puffs into the lungs 2 (two) times daily. 10.2 g 12   carvedilol (COREG) 6.25 MG tablet Take 1 tablet (6.25 mg total) by mouth 2 (two) times daily. 180 tablet 3   colchicine 0.6 MG tablet Take 2 tabs (1.2 mg) at the onset of a gout flare, may repeat 1 tab (0.6 mg) after 2 hours if symptoms persist. 30 tablet 1   dapagliflozin propanediol (FARXIGA) 10 MG TABS tablet Take 1 tablet (10 mg total) by mouth daily. 90 tablet 3   glipiZIDE (GLUCOTROL) 5 MG tablet Take 0.5 tablets (2.5 mg total) by mouth daily before breakfast. 45 tablet 3   glucose blood (ACCU-CHEK GUIDE) test strip Use as directed to check blood sugar 1-2 times a day (Patient not taking: Reported on 11/16/2021) 100 each 12   isosorbide-hydrALAZINE (BIDIL) 20-37.5 MG tablet Take 1 tablet by mouth 3 (three) times daily. 90 tablet 6   ketorolac (ACULAR) 0.5 % ophthalmic solution Place 1 drop into the right eye 4 (four) times daily. 5 mL 0   montelukast (SINGULAIR) 10 MG tablet TAKE 1 TABLET (10 MG TOTAL) BY MOUTH AT BEDTIME. 30 tablet 2   predniSONE (DELTASONE) 20 MG tablet Take 2 tablets (40 mg total) by mouth daily. 8 tablet 0   torsemide (DEMADEX) 20 MG tablet Take 2 tablets (40 mg total) by mouth daily. 60 tablet 3   [DISCONTINUED] metoprolol tartrate (LOPRESSOR) 100 MG tablet Take 1 tablet (100 mg total) by mouth 2  (two) times daily. 60 tablet 0   No current facility-administered medications on file prior to visit.    No Known Allergies  Social History   Socioeconomic History   Marital status: Divorced    Spouse name: Not on file   Number of children: 3   Years of education: Not on file   Highest education level: High school graduate  Occupational History   Occupation: disability  Tobacco Use   Smoking status: Every Day    Packs/day: 1.00    Years: 43.00    Total pack years: 43.00    Types: Cigarettes   Smokeless tobacco: Never   Tobacco comments:    4 cigarettes/day x 2 month (04/14/2021)  Vaping Use   Vaping Use: Never used  Substance and Sexual Activity   Alcohol use: No  Drug use: Yes    Frequency: 21.0 times per week    Types: Marijuana, Cocaine    Comment: last use Cocaine- 03/28/2021. Still using marijuana   Sexual activity: Not on file  Other Topics Concern   Not on file  Social History Narrative   ** Merged History Encounter **       Social Determinants of Health   Financial Resource Strain: Medium Risk (06/19/2021)   Overall Financial Resource Strain (CARDIA)    Difficulty of Paying Living Expenses: Somewhat hard  Food Insecurity: No Food Insecurity (04/14/2021)   Hunger Vital Sign    Worried About Running Out of Food in the Last Year: Never true    Ran Out of Food in the Last Year: Never true  Transportation Needs: Unmet Transportation Needs (10/13/2021)   PRAPARE - Hydrologist (Medical): Yes    Lack of Transportation (Non-Medical): Yes  Physical Activity: Insufficiently Active (11/16/2021)   Exercise Vital Sign    Days of Exercise per Week: 5 days    Minutes of Exercise per Session: 20 min  Stress: Not on file  Social Connections: Moderately Isolated (04/28/2021)   Social Connection and Isolation Panel [NHANES]    Frequency of Communication with Friends and Family: More than three times a week    Frequency of Social Gatherings with  Friends and Family: Once a week    Attends Religious Services: More than 4 times per year    Active Member of [employer] or Organizations: No    Attends Archivist Meetings: Never    Marital Status: Divorced  Human resources officer Violence: Not on file    Family History  Problem Relation Age of Onset   Heart disease Father    Diabetes Mother    HIV Brother    Healthy Son    Healthy Daughter     Past Surgical History:  Procedure Laterality Date   ANKLE SURGERY     CARDIAC CATHETERIZATION     CARDIOVERSION N/A 07/08/2020   Procedure: CARDIOVERSION;  Surgeon: Corey Skains, MD;  Location: [facility] ORS;  Service: Cardiovascular;  Laterality: N/A;   COLONOSCOPY WITH PROPOFOL N/A 11/19/2021   Procedure: COLONOSCOPY WITH PROPOFOL;  Surgeon: Daryel November, MD;  Location: [facility];  Service: Gastroenterology;  Laterality: N/A;   HERNIA REPAIR     x2   POLYPECTOMY  11/19/2021   Procedure: POLYPECTOMY;  Surgeon: Daryel November, MD;  Location: [facility];  Service: Gastroenterology;;   SHOULDER SURGERY     TEE WITHOUT CARDIOVERSION N/A 07/08/2020   Procedure: TRANSESOPHAGEAL ECHOCARDIOGRAM (TEE);  Surgeon: Corey Skains, MD;  Location: [facility] ORS;  Service: Cardiovascular;  Laterality: N/A;    ROS: Review of Systems Negative except as stated above  PHYSICAL EXAM: BP 138/80 (BP Location: Left Arm, Patient Position: Sitting, Cuff Size: Large)   Pulse 73   Temp 97.8 F (36.6 C) (Oral)   Resp 20   Wt 265 lb (120.2 kg)   SpO2 96%   BMI 34.96 kg/m   Wt Readings from Last 3 Encounters:  12/21/21 265 lb (120.2 kg)  11/21/21 268 lb 15.4 oz (122 kg)  11/19/21 270 lb (122.5 kg)    Physical Exam  General appearance - alert, well appearing, and in no distress Mental status - normal mood, behavior, speech, dress, motor activity, and thought processes Neck - supple, no significant adenopathy Chest - clear to auscultation, no wheezes, rales or rhonchi, symmetric air  entry Heart -  normal rate, regular rhythm, normal S1, S2, no murmurs, rubs, clicks or gallops Extremities - peripheral pulses normal, no pedal edema, no clubbing or cyanosis      Latest Ref Rng & Units 10/29/2021    9:13 AM 09/03/2021    8:42 AM 07/20/2021   11:25 AM  CMP  Glucose 70 - 99 mg/dL 122  107  89   BUN 6 - 20 mg/dL 33  30  32   Creatinine 0.61 - 1.24 mg/dL 2.19  2.03  2.02   Sodium 135 - 145 mmol/L 136  136  137   Potassium 3.5 - 5.1 mmol/L 4.6  5.1  4.3   Chloride 98 - 111 mmol/L 112  98  97   CO2 22 - 32 mmol/L _0 Calcium 8.9 - 10.3 mg/dL 8.3  9.4  9.9    Lipid Panel     Component Value Date/Time   CHOL 176 12/25/2019 0632   CHOL 160 11/07/2019 0918   CHOL 157 10/21/2013 0435   TRIG 91 12/25/2019 0632   TRIG 117 10/21/2013 0435   HDL 37 (L) 12/25/2019 0632   HDL 40 11/07/2019 0918   HDL 30 (L) 10/21/2013 0435   CHOLHDL 4.8 12/25/2019 0632   VLDL 18 12/25/2019 0632   VLDL 23 10/21/2013 0435   LDLCALC 121 (H) 12/25/2019 0632   LDLCALC 92 11/07/2019 0918   LDLCALC 104 (H) 10/21/2013 0435    CBC    Component Value Date/Time   WBC 12.5 (H) 10/29/2021 0913   RBC 4.36 10/29/2021 0913   HGB 13.6 10/29/2021 0913   HGB 15.4 11/07/2019 0918   HCT 41.4 10/29/2021 0913   HCT 46.2 11/07/2019 0918   PLT 157 10/29/2021 0913   PLT 245 11/07/2019 0918   MCV 95.0 10/29/2021 0913   MCV 90 11/07/2019 0918   MCV 92 10/21/2013 0435   MCH 31.2 10/29/2021 0913   MCHC 32.9 10/29/2021 0913   RDW 15.3 10/29/2021 0913   RDW 14.2 11/07/2019 0918   RDW 15.7 (H) 10/21/2013 0435   LYMPHSABS 2.9 02/09/2021 0830   LYMPHSABS 1.1 10/21/2013 0435   MONOABS 1.0 02/09/2021 0830   MONOABS 0.6 10/21/2013 0435   EOSABS 0.3 02/09/2021 0830   EOSABS 0.0 10/21/2013 0435   BASOSABS 0.0 02/09/2021 0830   BASOSABS 0.0 10/21/2013 0435    ASSESSMENT AND PLAN:  1. Type 2 diabetes mellitus with obesity ([place]) At goal.  Continue low-dose Glucotrol. Encourage healthy eating habits  with smaller portion sizes. Try to eliminate sugary drinks from the diet. - Glucose (CBG) - HgB A1c - Ambulatory referral to Ophthalmology  2. Hypertension associated with diabetes ([place]) Not at goal with patient has not taken his known dose of BiDil as yet.  No changes made.  Continue carvedilol 6.25 mg twice a day, torsemide 40 mg daily, BiDil 20/37.5 mg 3 times a day  3. Chronic combined systolic and diastolic CHF (congestive heart failure) (HCC) Stable and compensated.  Continue torsemide, Farxiga, carvedilol and BiDil  4. Stage 3b chronic kidney disease ([place]) Most recent creatinine 2.19 with GFR of 34.  Patient will call [facility] and try to schedule an appointment himself for follow-up.  5. Constipation by delayed colonic transit Discussed the importance of increasing fiber in the diet.  We will give a trial of MiraLAX since the Senokot-S did not help. - polyethylene glycol powder (GLYCOLAX/MIRALAX) 17 GM/SCOOP powder; Take 17 g by mouth daily as needed for  mild constipation.  Dispense: 3350 g; Refill: 1  6. Tobacco dependence Commended him on continuing to cut back.  Encouraged him to set a quit date.  7. Need for shingles vaccine Second shot of Shingrix given today.   Patient was given the opportunity to ask questions.  Patient verbalized understanding of the plan and was able to repeat key elements of the plan.   This documentation was completed using Radio producer.  Any transcriptional errors are unintentional.  Orders Placed This Encounter  Procedures   Varicella-zoster vaccine IM (Shingrix)   Ambulatory referral to Ophthalmology   Glucose (CBG)   HgB A1c     Requested Prescriptions   Signed Prescriptions Disp Refills   polyethylene glycol powder (GLYCOLAX/MIRALAX) 17 GM/SCOOP powder 3350 g 1    Sig: Take 17 g by mouth daily as needed for mild constipation.    No follow-ups on file.  Karle Plumber, MD, FACP

## 2021-12-22 ENCOUNTER — Other Ambulatory Visit: Payer: Self-pay

## 2021-12-30 ENCOUNTER — Other Ambulatory Visit: Payer: Self-pay | Admitting: Internal Medicine

## 2021-12-30 ENCOUNTER — Other Ambulatory Visit: Payer: Self-pay | Admitting: *Deleted

## 2021-12-30 ENCOUNTER — Other Ambulatory Visit: Payer: Self-pay

## 2021-12-30 DIAGNOSIS — M109 Gout, unspecified: Secondary | ICD-10-CM

## 2021-12-30 MED ORDER — ALLOPURINOL 100 MG PO TABS
ORAL_TABLET | Freq: Every day | ORAL | 1 refills | Status: DC
  Filled 2021-12-30: qty 180, 90d supply, fill #0
  Filled 2021-12-30 – 2022-02-12 (×2): qty 60, 30d supply, fill #0

## 2021-12-30 NOTE — Patient Instructions (Signed)
Visit Information  Mr. Mcgroarty was given information about Medicaid Managed Care team care coordination services as a part of their Healthy [facility] Medicaid benefit. SHELLIE GOETTL verbally consented to engagement with the [facility] Managed Care team.   If you are experiencing a medical emergency, please call 911 or report to your local emergency department or urgent care.   If you have a non-emergency medical problem during routine business hours, please contact your provider's office and ask to speak with a nurse.   For questions related to your Healthy [facility] health plan, please call: [phone] or visit the homepage here: [email or web address]  If you would like to schedule transportation through your Healthy [facility] plan, please call the following number at least 2 days in advance of your appointment: [phone]  For information about your ride after you set it up, call Ride Assist at [phone]. Use this number to activate a Will Call pickup, or if your transportation is late for a scheduled pickup. Use this number, too, if you need to make a change or cancel a previously scheduled reservation.  If you need transportation services right away, call [phone]. The after-hours call center is staffed 24 hours to handle ride assistance and urgent reservation requests (including discharges) 365 days a year. Urgent trips include sick visits, hospital discharge requests and life-sustaining treatment.  Call the [facility] at [phone], at any time, 24 hours a day, 7 days a week. If you are in danger or need immediate medical attention call 911.  If you would like help to quit smoking, call 1-800-QUIT-NOW 281-478-5525) OR Espaol: 1-855-Djelo-Ya (6-767-209-4709) o para ms informacin haga clic aqu or Text READY to 200-400 to register via text  Mr. Hardge,   Please see education materials related to diverticulosis  provided as print materials.   The patient verbalized understanding of instructions, educational materials, and care plan provided today and agreed to receive a mailed copy of patient instructions, educational materials, and care plan.   Telephone follow up appointment with Managed Medicaid care management team member scheduled for:01/15/22 @ 3:45pm  [name] RN, BSN [place] RN Care Coordinator   Following is a copy of your plan of care:  Care Plan : RN Care Manager Plan of Care  Updates made by [name], RN since 12/30/2021 12:00 AM     Problem: Knowledge Deficits and Care Coordination Needs related to long term management of CHF   Priority: High     Long-Range Goal: Development of Plan of Care to address Care Coordination Needs and Knowledge Deficits related to CHF   Start Date: 04/28/2021  Expected End Date: 02/11/2022  Priority: High  Note:   Current Barriers:  Knowledge Deficits related to plan of care for management of CHF  Care Coordination needs related to Transportation  Non-adherence to prescribed medication regimen Difficulty obtaining medications Mr. Stoklosa has Colonoscopy scheduled on 11/19/21. He picked up his prep and has questions on how to proceed. He has transportation arranged for this procedure.  RNCM Clinical Goal(s):  Patient will verbalize understanding of plan for management of CHF as evidenced by patient verbalization of plan take all medications exactly as prescribed and will call provider for medication related questions as evidenced by physician notes in EMR    attend all scheduled medical appointments: Echocardiogram 01/26/22 and Cardiology 01/29/22 as evidenced by physician notes in EMR        demonstrate improved adherence to prescribed treatment plan for  CHF as evidenced by attending Cardiology 10/15/21 @ 8:40am work with pharmacist to address Medication procurement related to CHF as evidenced by review of EMR and  patient or pharmacist report    through collaboration with Consulting civil engineer, provider, and care team.   Interventions: Inter-disciplinary care team collaboration (see longitudinal plan of care) Evaluation of current treatment plan related to  self management and patient's adherence to plan as established by provider Advised patient to take all medications with him to all appointments Advised patient to contact [facility] as directed by Dr. Wynetta Emery at last appointment   GI issues  (Status:  Goal on track:  Yes.)  Long Term Goal Evaluation of current treatment plan related to  GI issues ,  self-management and patient's adherence to plan as established by provider. Discussed plans with patient for ongoing care management follow up and provided patient with direct contact information for care management team Evaluation of current treatment plan related to GI and patient's adherence to plan as established by provider Provided education to patient re: diverticulosis Reviewed scheduled/upcoming provider appointments including Advised patient to discuss any abdominal changes with pain or bowel movements with provider   COPD Interventions:  (Status:  Goal on track:  NO.) Long Term Goal Advised patient to track and manage COPD triggers Advised patient to self assesses COPD action plan zone and make appointment with provider if in the yellow zone for 48 hours without improvement Discussed the importance of adequate rest and management of fatigue with COPD Assessed social determinant of health barriers Advised patient to discuss concerns with provider at next visit Advised patient to pick up medication refills and take as directed Advised patient to avoid spending time outside while the air quality is bad   Heart Failure Interventions:  (Status: Goal on Track (progressing): YES.)  Long Term Goal  Provided education on low sodium diet Discussed the importance of keeping all appointments with  provider Provided patient with education about the role of exercise in the management of heart failure Assessed social determinant of health barriers Reviewed upcoming appointments for echocardiogram and Cardiology, patient will call to arrange transportation with [facility] Advised patient to take all medications to his provider appointments  Patient Goals/Self-Care Activities: Patient will self administer medications as prescribed as evidenced by self report/primary caregiver report  Patient will attend all scheduled provider appointments as evidenced by clinician review of documented attendance to scheduled appointments and patient/caregiver report Patient will attend church or other social activities as evidenced by patient report Patient will continue to perform ADL's independently as evidenced by patient/caregiver report Patient will continue to perform IADL's independently as evidenced by patient/caregiver report Patient will call provider office for new concerns or questions as evidenced by review of documented incoming telephone call notes and patient report Patient will work with [facility] Pharmacist for medication management as evidenced by notes in EMR call office if I gain more than 2 pounds in one day or 5 pounds in one week use salt in moderation watch for swelling in feet, ankles and legs every day weigh myself daily know when to call the doctor

## 2021-12-30 NOTE — Patient Outreach (Signed)
Medicaid Managed Care   Nurse Care Manager Note  12/30/2021 Name:  Chris [name] MRN:  [id number] DOB:  [birth year]  Chris [name] is an 61 y.o. year old male who is a primary patient of Chris Pier, MD.  The [facility] Managed Care Coordination team was consulted for assistance with:    CHF COPD GI issues  Chris [name] was given information about Medicaid Managed Care Coordination team services today. Chris [name] Patient agreed to services and verbal consent obtained.  Engaged with patient by telephone for follow up visit in response to provider referral for case management and/or care coordination services.   Assessments/Interventions:  Review of past medical history, allergies, medications, health status, including review of consultants reports, laboratory and other test data, was performed as part of comprehensive evaluation and provision of chronic care management services.  SDOH (Social Determinants of Health) assessments and interventions performed:   Care Plan  No Known Allergies  Medications Reviewed Today     Reviewed by Chris Montane, RN (Registered Nurse) on 12/30/21 at 1338  Med List Status: <None>   Medication Order Taking? Sig Documenting Provider Last Dose Status Informant  Accu-Chek Softclix Lancets lancets [id number] No Use as instructed  Patient not taking: Reported on 11/16/2021   Chris Pier, MD Not Taking Active Self, Pharmacy Records  albuterol (PROVENTIL) (2.5 MG/3ML) 0.083% nebulizer solution [id number] No TAKE 3 MLS BY NEBULIZATION EVERY 6 (SIX) HOURS AS NEEDED FOR SHORTNESS OF BREATH.  Patient not taking: Reported on 12/30/2021   Chris Pier, MD Not Taking Active Self, Pharmacy Records  albuterol (VENTOLIN HFA) 108 (90 Base) MCG/ACT inhaler [id number] Yes Inhale 2 puffs into the lungs every 6 (six) hours as needed for wheezing or shortness of breath. Chris Pier, MD Taking Active Self, Pharmacy Records  allopurinol (ZYLOPRIM)  100 MG tablet [id number]  TAKE 2 TABLETS (200 MG TOTAL) BY MOUTH DAILY. Chris Pier, MD  Expired 11/30/21 2359 Self, Pharmacy Records           Med Note Chris [name] Apr 27, 2021  1:29 PM)    aspirin 81 MG EC tablet [id number] No Take 1 tablet (81 mg total) by mouth daily. Chris Pier, MD Unknown Active Self, Pharmacy Records           Med Note Chris [name] Apr 27, 2021  1:27 PM)    atorvastatin (LIPITOR) 40 MG tablet [id number] No Take 1 tablet (40 mg total) by mouth daily. Chris Pier, MD Unknown Active Self, Pharmacy Records           Med Note Chris [name] Apr 27, 2021  1:29 PM)    Blood Glucose Monitoring Suppl (ACCU-CHEK GUIDE) w/Device KIT [id number] No Use as directed  Patient not taking: Reported on 11/16/2021   Chris Pier, MD Not Taking Active Self, Pharmacy Records  budesonide-formoterol [facility]) 160-4.5 MCG/ACT inhaler [id number] No Inhale 2 puffs into the lungs 2 (two) times daily. Chris Pier, MD Unknown Active Self, Pharmacy Records           Med Note Chris [name] Apr 27, 2021  1:29 PM)    carvedilol (COREG) 6.25 MG tablet [id number] No Take 1 tablet (6.25 mg total) by mouth 2 (two) times daily. Chris [name] Unknown Active Self, Pharmacy Records           Med Note [place], [facility]  N   Thu Oct 29, 2021 10:02 AM) Last dispense date is January but pt states he took this yesterday  colchicine 0.6 MG tablet [id number] No Take 2 tabs (1.2 mg) at the onset of a gout flare, may repeat 1 tab (0.6 mg) after 2 hours if symptoms persist. Chris Pier, MD Unknown Active Self, Pharmacy Records  dapagliflozin propanediol (FARXIGA) 10 MG TABS tablet [id number] No Take 1 tablet (10 mg total) by mouth daily. Chris [name] Unknown Active Self, Pharmacy Records           Med Note Chris [name] Oct 29, 2021 10:02 AM) Last dispense date was January but pt states he took this yesterday  glipiZIDE  (GLUCOTROL) 5 MG tablet [id number] No Take 0.5 tablets (2.5 mg total) by mouth daily before breakfast. Chris Pier, MD Unknown Active Self, Pharmacy Records           Med Note Chris [name], [facility] Oct 29, 2021 10:01 AM) Pt states he also took this yesterday even though dispense dates he should be out of it  glucose blood (ACCU-CHEK GUIDE) test strip [id number] No Use as directed to check blood sugar 1-2 times a day  Patient not taking: Reported on 11/16/2021   Chris Pier, MD Not Taking Active Self, Pharmacy Records  isosorbide-hydrALAZINE (BIDIL) 20-37.5 MG tablet [id number] No Take 1 tablet by mouth 3 (three) times daily. Chris Pier, MD Unknown Active Self, Pharmacy Records           Med Note Chris [name], [facility] N   Thu Oct 29, 2021 10:00 AM) Pt states he took this yesterday but it looks like it hasnt be dispensed since march  ketorolac (ACULAR) 0.5 % ophthalmic solution [id number] No Place 1 drop into the right eye 4 (four) times daily. Chris Mew, MD Unknown Active     Discontinued 07/11/20 [phone] (Discontinued by provider)   montelukast (SINGULAIR) 10 MG tablet [id number] No TAKE 1 TABLET (10 MG TOTAL) BY MOUTH AT BEDTIME. Chris Pier, MD Unknown Active Self, Pharmacy Records  polyethylene glycol powder [facility]) 17 GM/SCOOP powder [id number] No Take 17 g by mouth daily as needed for mild constipation. Chris Pier, MD Unknown Active   predniSONE (DELTASONE) 20 MG tablet [id number] No Take 2 tablets (40 mg total) by mouth daily. Chris Cecil, PA-C Unknown Active   torsemide (DEMADEX) 20 MG tablet [id number] No Take 2 tablets (40 mg total) by mouth daily. Chris Sable, MD Unknown Active Self, Pharmacy Records            Patient Active Problem List   Diagnosis Date Noted   Right kidney mass 05/14/2021   CHF exacerbation ([place]) 04/14/2021   Chest pain 04/14/2021   Syncope 04/14/2021   Left-sided weakness 10/28/2020   COPD  exacerbation (HCC)    Typical atrial flutter (HCC)    CHF (congestive heart failure) ([place]) 07/04/2020   Acute exacerbation of CHF (congestive heart failure) ([place]) 06/16/2020   COPD with acute exacerbation ([place]) 06/16/2020   Influenza vaccine refused 05/06/2020   Acute on chronic combined systolic (congestive) and diastolic (congestive) heart failure ([place]) 05/05/2020   Acute decompensated heart failure ([place]) 05/04/2020   Illiteracy 05/04/2020   Type 2 diabetes mellitus with stage 3 chronic kidney disease ([place]) 12/25/2019   Acute on chronic combined systolic and diastolic CHF (congestive heart failure) ([place]) 10/26/2019   Elevated troponin I level 10/26/2019   Acute on chronic diastolic (congestive)  heart failure ([place]) 10/26/2019   History of gout 02/01/2019   Seasonal allergic rhinitis due to pollen 02/01/2019   Tobacco dependence 11/30/2018   Microscopic hematuria 11/30/2018   Depression 11/30/2018   Difficulty controlling anger 11/30/2018   CAP (community acquired pneumonia) 08/11/2018   COPD (chronic obstructive pulmonary disease) (HCC)    CKD (chronic kidney disease) stage 3, GFR 30-59 ml/min ([place]) 08/10/2018   Recurrent epistaxis 04/21/2018   Mixed hyperlipidemia 07/28/2017   Essential hypertension 18/84/1660   Chronic systolic heart failure ([place]) 10/25/2014   Cocaine abuse ([place]) 02/20/2013   Cannabis abuse 02/20/2013   Back pain, chronic 02/20/2013    Conditions to be addressed/monitored per PCP order:  CHF, COPD, and GI issues  Care Plan : RN Care Manager Plan of Care  Updates made by Chris Montane, RN since 12/30/2021 12:00 AM     Problem: Knowledge Deficits and Care Coordination Needs related to long term management of CHF   Priority: High     Long-Range Goal: Development of Plan of Care to address Care Coordination Needs and Knowledge Deficits related to CHF   Start Date: 04/28/2021  Expected End Date: 02/11/2022  Priority: High  Note:   Current Barriers:   Knowledge Deficits related to plan of care for management of CHF  Care Coordination needs related to Transportation  Non-adherence to prescribed medication regimen Difficulty obtaining medications Mr. Tschantz has Colonoscopy scheduled on 11/19/21. He picked up his prep and has questions on how to proceed. He has transportation arranged for this procedure.  RNCM Clinical Goal(s):  Patient will verbalize understanding of plan for management of CHF as evidenced by patient verbalization of plan take all medications exactly as prescribed and will call provider for medication related questions as evidenced by physician notes in EMR    attend all scheduled medical appointments: Echocardiogram 01/26/22 and Cardiology 01/29/22 as evidenced by physician notes in EMR        demonstrate improved adherence to prescribed treatment plan for CHF as evidenced by attending Cardiology 10/15/21 @ 8:40am work with pharmacist to address Medication procurement related to CHF as evidenced by review of EMR and patient or pharmacist report    through collaboration with Consulting civil engineer, provider, and care team.   Interventions: Inter-disciplinary care team collaboration (see longitudinal plan of care) Evaluation of current treatment plan related to  self management and patient's adherence to plan as established by provider Advised patient to take all medications with him to all appointments Advised patient to contact [facility] as directed by Dr. Wynetta Emery at last appointment   GI issues  (Status:  Goal on track:  Yes.)  Long Term Goal Evaluation of current treatment plan related to  GI issues ,  self-management and patient's adherence to plan as established by provider. Discussed plans with patient for ongoing care management follow up and provided patient with direct contact information for care management team Evaluation of current treatment plan related to GI and patient's adherence to plan as established by  provider Provided education to patient re: diverticulosis Reviewed scheduled/upcoming provider appointments including Advised patient to discuss any abdominal changes with pain or bowel movements with provider   COPD Interventions:  (Status:  Goal on track:  NO.) Long Term Goal Advised patient to track and manage COPD triggers Advised patient to self assesses COPD action plan zone and make appointment with provider if in the yellow zone for 48 hours without improvement Discussed the importance of adequate rest and management of fatigue  with COPD Assessed social determinant of health barriers Advised patient to discuss concerns with provider at next visit Advised patient to pick up medication refills and take as directed Advised patient to avoid spending time outside while the air quality is bad   Heart Failure Interventions:  (Status: Goal on Track (progressing): YES.)  Long Term Goal  Provided education on low sodium diet Discussed the importance of keeping all appointments with provider Provided patient with education about the role of exercise in the management of heart failure Assessed social determinant of health barriers Reviewed upcoming appointments for echocardiogram and Cardiology, patient will call to arrange transportation with [employer] Advised patient to take all medications to his provider appointments  Patient Goals/Self-Care Activities: Patient will self administer medications as prescribed as evidenced by self report/primary caregiver report  Patient will attend all scheduled provider appointments as evidenced by clinician review of documented attendance to scheduled appointments and patient/caregiver report Patient will attend church or other social activities as evidenced by patient report Patient will continue to perform ADL's independently as evidenced by patient/caregiver report Patient will continue to perform IADL's independently as evidenced by  patient/caregiver report Patient will call provider office for new concerns or questions as evidenced by review of documented incoming telephone call notes and patient report Patient will work with [facility] Pharmacist for medication management as evidenced by notes in EMR call office if I gain more than 2 pounds in one day or 5 pounds in one week use salt in moderation watch for swelling in feet, ankles and legs every day weigh myself daily know when to call the doctor        Follow Up:  Patient agrees to Care Plan and Follow-up.  Plan: The Managed Medicaid care management team will reach out to the patient again over the next 14 days.  Date/time of next scheduled RN care management/care coordination outreach:  01/15/22 @ 3:45pm  [name] RN, BSN [place]  [employer] manager

## 2021-12-31 ENCOUNTER — Other Ambulatory Visit: Payer: Self-pay

## 2022-01-01 ENCOUNTER — Other Ambulatory Visit: Payer: Self-pay

## 2022-01-04 ENCOUNTER — Emergency Department (HOSPITAL_COMMUNITY)
Admission: EM | Admit: 2022-01-04 | Discharge: 2022-01-04 | Disposition: A | Discharge status: Home / Self Care | Payer: Medicaid Other | Attending: Emergency Medicine | Admitting: Emergency Medicine

## 2022-01-04 ENCOUNTER — Emergency Department (HOSPITAL_COMMUNITY): Payer: Medicaid Other

## 2022-01-04 ENCOUNTER — Other Ambulatory Visit: Payer: Self-pay

## 2022-01-04 DIAGNOSIS — I130 Hypertensive heart and chronic kidney disease with heart failure and stage 1 through stage 4 chronic kidney disease, or unspecified chronic kidney disease: Secondary | ICD-10-CM | POA: Insufficient documentation

## 2022-01-04 DIAGNOSIS — J449 Chronic obstructive pulmonary disease, unspecified: Secondary | ICD-10-CM | POA: Insufficient documentation

## 2022-01-04 DIAGNOSIS — Z7982 Long term (current) use of aspirin: Secondary | ICD-10-CM | POA: Insufficient documentation

## 2022-01-04 DIAGNOSIS — Z79899 Other long term (current) drug therapy: Secondary | ICD-10-CM | POA: Diagnosis not present

## 2022-01-04 DIAGNOSIS — Z7951 Long term (current) use of inhaled steroids: Secondary | ICD-10-CM | POA: Diagnosis not present

## 2022-01-04 DIAGNOSIS — Z7984 Long term (current) use of oral hypoglycemic drugs: Secondary | ICD-10-CM | POA: Diagnosis not present

## 2022-01-04 DIAGNOSIS — R0602 Shortness of breath: Secondary | ICD-10-CM | POA: Diagnosis not present

## 2022-01-04 DIAGNOSIS — R61 Generalized hyperhidrosis: Secondary | ICD-10-CM | POA: Diagnosis not present

## 2022-01-04 DIAGNOSIS — I509 Heart failure, unspecified: Secondary | ICD-10-CM | POA: Diagnosis not present

## 2022-01-04 DIAGNOSIS — Z20822 Contact with and (suspected) exposure to covid-19: Secondary | ICD-10-CM | POA: Insufficient documentation

## 2022-01-04 DIAGNOSIS — R059 Cough, unspecified: Secondary | ICD-10-CM | POA: Insufficient documentation

## 2022-01-04 DIAGNOSIS — E1122 Type 2 diabetes mellitus with diabetic chronic kidney disease: Secondary | ICD-10-CM | POA: Insufficient documentation

## 2022-01-04 DIAGNOSIS — R0789 Other chest pain: Secondary | ICD-10-CM | POA: Insufficient documentation

## 2022-01-04 DIAGNOSIS — N183 Chronic kidney disease, stage 3 unspecified: Secondary | ICD-10-CM | POA: Insufficient documentation

## 2022-01-04 DIAGNOSIS — R42 Dizziness and giddiness: Secondary | ICD-10-CM | POA: Insufficient documentation

## 2022-01-04 DIAGNOSIS — R600 Localized edema: Secondary | ICD-10-CM | POA: Diagnosis not present

## 2022-01-04 LAB — BASIC METABOLIC PANEL
Anion gap: 7 (ref 5–15)
BUN: 22 mg/dL — ABNORMAL HIGH (ref 6–20)
CO2: 26 mmol/L (ref 22–32)
Calcium: 9 mg/dL (ref 8.9–10.3)
Chloride: 107 mmol/L (ref 98–111)
Creatinine, Ser: 1.77 mg/dL — ABNORMAL HIGH (ref 0.61–1.24)
GFR, Estimated: 44 mL/min — ABNORMAL LOW (ref >60)
Glucose, Bld: 106 mg/dL — ABNORMAL HIGH (ref 70–99)
Potassium: 3.8 mmol/L (ref 3.5–5.1)
Sodium: 140 mmol/L (ref 135–145)

## 2022-01-04 LAB — CBC
HCT: 45 % (ref 39.0–52.0)
Hemoglobin: 14.6 g/dL (ref 13.0–17.0)
MCH: 30.2 pg (ref 26.0–34.0)
MCHC: 32.4 g/dL (ref 30.0–36.0)
MCV: 93 fL (ref 80.0–100.0)
Platelets: 211 10*3/uL (ref 150–400)
RBC: 4.84 MIL/uL (ref 4.22–5.81)
RDW: 14.8 % (ref 11.5–15.5)
WBC: 10.4 10*3/uL (ref 4.0–10.5)
nRBC: 0 % (ref 0.0–0.2)

## 2022-01-04 LAB — RESP PANEL BY RT-PCR (FLU A&B, COVID) ARPGX2
Influenza A by PCR: NEGATIVE
Influenza B by PCR: NEGATIVE
SARS Coronavirus 2 by RT PCR: NEGATIVE

## 2022-01-04 LAB — TROPONIN I (HIGH SENSITIVITY)
Troponin I (High Sensitivity): 33 ng/L — ABNORMAL HIGH (ref <18)
Troponin I (High Sensitivity): 31 ng/L — ABNORMAL HIGH (ref <18)

## 2022-01-04 LAB — BRAIN NATRIURETIC PEPTIDE: B Natriuretic Peptide: 255.8 pg/mL — ABNORMAL HIGH (ref 0.0–100.0)

## 2022-01-04 MED ORDER — FUROSEMIDE 10 MG/ML IJ SOLN
80.0000 mg | Freq: Once | INTRAMUSCULAR | Status: AC
  Administered 2022-01-04: 80 mg via INTRAVENOUS
  Filled 2022-01-04: qty 8

## 2022-01-04 NOTE — ED Triage Notes (Signed)
Patient reports lightheadedness and dizziness that originally started yesterday around 1030 am. Ever since this start patient reports chest tightness in centralized along with [facility]. Pt with hx of CHF, renal disease. This was intermittent w/ exertion initially but now he is feeling it constantly even at rest. Pt Aox4.

## 2022-01-04 NOTE — ED Notes (Signed)
Pt requesting more food and drink, wants an inpt meal tray.

## 2022-01-04 NOTE — ED Notes (Addendum)
Up to BS to use urinal. Given snack and drink per request.

## 2022-01-04 NOTE — ED Provider Notes (Signed)
[facility] EMERGENCY DEPARTMENT Provider Note   CSN: [id number] Arrival date & time: 01/04/22  8921     History  Chief Complaint  Patient presents with   Dizziness   Chest Pain    DELQUAN POUCHER is a 61 y.o. male. With past medical history of diabetes, GERD, HTN, CHF with EF 25-35%, COPD, CKD III, who presents to the emergency department with chest pain and dizziness.   States that he has had worsening shortness of breath and chest pain over the past 3 days.  Describes having a tightness consistently in his chest.  He has intermittent episodes of sharp central chest pain that is nonradiating.  He also endorses some mild numbness and tingling in his bilateral hands.  Has had episodes of diaphoresis.  Denies any nausea or vomiting.  He states that he has been coughing but has been nonproductive.  He has more so chronic cough from smoking.  He wears oxygen at home as needed.  He denies any recent fevers, palpitations, abdominal pain, dysuria.  He does take his weight which is around 270.  He denies any recent weight gain.  Denies sick contacts.   Dizziness Associated symptoms: chest pain and shortness of breath   Associated symptoms: no palpitations   Chest Pain Associated symptoms: cough, diaphoresis, dizziness and shortness of breath   Associated symptoms: no palpitations        Home Medications Prior to Admission medications   Medication Sig Start Date End Date Taking? Authorizing Provider  Accu-Chek Softclix Lancets lancets Use as instructed Patient not taking: Reported on 11/16/2021 12/12/20   [name], MD  albuterol (PROVENTIL) (2.5 MG/3ML) 0.083% nebulizer solution TAKE 3 MLS BY NEBULIZATION EVERY 6 (SIX) HOURS AS NEEDED FOR SHORTNESS OF BREATH. Patient not taking: Reported on 12/30/2021 10/09/21 10/09/22  [name], MD  albuterol (VENTOLIN HFA) 108 (90 Base) MCG/ACT inhaler Inhale 2 puffs into the lungs every 6 (six) hours as needed for  wheezing or shortness of breath. 05/21/21   [name], MD  allopurinol (ZYLOPRIM) 100 MG tablet TAKE 2 TABLETS (200 MG TOTAL) BY MOUTH DAILY. 12/30/21   [name], MD  aspirin 81 MG EC tablet Take 1 tablet (81 mg total) by mouth daily. 11/30/20   [name], MD  atorvastatin (LIPITOR) 40 MG tablet Take 1 tablet (40 mg total) by mouth daily. 11/30/20   [name], MD  Blood Glucose Monitoring Suppl (ACCU-CHEK GUIDE) w/Device KIT Use as directed Patient not taking: Reported on 11/16/2021 12/12/20   [name], MD  budesonide-formoterol [facility]) 160-4.5 MCG/ACT inhaler Inhale 2 puffs into the lungs 2 (two) times daily. 11/30/20   [name], MD  carvedilol (COREG) 6.25 MG tablet Take 1 tablet (6.25 mg total) by mouth 2 (two) times daily. 12/01/20   Alisa Graff, FNP  colchicine 0.6 MG tablet Take 2 tabs (1.2 mg) at the onset of a gout flare, may repeat 1 tab (0.6 mg) after 2 hours if symptoms persist. 10/09/21   [name], MD  dapagliflozin propanediol (FARXIGA) 10 MG TABS tablet Take 1 tablet (10 mg total) by mouth daily. 12/01/20   Alisa Graff, FNP  glipiZIDE (GLUCOTROL) 5 MG tablet Take 0.5 tablets (2.5 mg total) by mouth daily before breakfast. 08/20/21   [name], MD  glucose blood (ACCU-CHEK GUIDE) test strip Use as directed to check blood sugar 1-2 times a day Patient not taking: Reported on 11/16/2021  12/12/20   [name], MD  isosorbide-hydrALAZINE (BIDIL) 20-37.5 MG tablet Take 1 tablet by mouth 3 (three) times daily. 08/20/21   [name], MD  ketorolac (ACULAR) 0.5 % ophthalmic solution Place 1 drop into the right eye 4 (four) times daily. 11/22/21   Carrie Mew, MD  montelukast (SINGULAIR) 10 MG tablet TAKE 1 TABLET (10 MG TOTAL) BY MOUTH AT BEDTIME. 05/15/21 05/15/22  [name], MD  polyethylene glycol powder (GLYCOLAX/MIRALAX) 17 GM/SCOOP powder Take 17 g by mouth daily as needed for mild constipation.  12/21/21   [name], MD  predniSONE (DELTASONE) 20 MG tablet Take 2 tablets (40 mg total) by mouth daily. 10/29/21   [name], PA-C  torsemide (DEMADEX) 20 MG tablet Take 2 tablets (40 mg total) by mouth daily. 07/10/21 12/30/21  [name], MD  metoprolol tartrate (LOPRESSOR) 100 MG tablet Take 1 tablet (100 mg total) by mouth 2 (two) times daily. 07/09/20 07/11/20  [name], MD      Allergies    Patient has no known allergies.    Review of Systems   Review of Systems  Constitutional:  Positive for diaphoresis.  Respiratory:  Positive for cough and shortness of breath.   Cardiovascular:  Positive for chest pain and leg swelling. Negative for palpitations.  Neurological:  Positive for dizziness.  All other systems reviewed and are negative.   Physical Exam Updated Vital Signs BP (!) 172/93   Pulse 67   Temp 97.8 F (36.6 C) (Oral)   Resp (!) 26   Ht 6' 1"  (1.854 m)   Wt 120 kg   SpO2 97%   BMI 34.90 kg/m  Physical Exam Vitals and nursing note reviewed.  Constitutional:      Appearance: He is ill-appearing.  HENT:     Head: Normocephalic and atraumatic.     Mouth/Throat:     Mouth: Mucous membranes are moist.     Pharynx: Oropharynx is clear.  Eyes:     General: No scleral icterus.    Extraocular Movements: Extraocular movements intact.     Conjunctiva/sclera: Conjunctivae normal.     Pupils: Pupils are equal, round, and reactive to light.  Neck:     Vascular: No JVD.  Cardiovascular:     Rate and Rhythm: Normal rate and regular rhythm.     Pulses:          Radial pulses are 2+ on the right side and 2+ on the left side.       Posterior tibial pulses are 1+ on the right side and 1+ on the left side.     Heart sounds: Normal heart sounds. No murmur heard. Pulmonary:     Effort: Tachypnea present. No respiratory distress.     Breath sounds: Examination of the right-lower field reveals rales. Examination of the left-lower field reveals  rales. Rales present.  Chest:     Chest wall: No tenderness.  Abdominal:     Palpations: Abdomen is soft.     Tenderness: There is no abdominal tenderness.  Musculoskeletal:        General: Normal range of motion.     Cervical back: Normal range of motion and neck supple.     Right lower leg: Edema present.     Left lower leg: No edema.  Skin:    General: Skin is warm and dry.     Capillary Refill: Capillary refill takes less than 2 seconds.  Neurological:  General: No focal deficit present.     Mental Status: He is alert and oriented to person, place, and time. Mental status is at baseline.  Psychiatric:        Mood and Affect: Mood normal.        Behavior: Behavior normal.        Thought Content: Thought content normal.        Judgment: Judgment normal.    ED Results / Procedures / Treatments   Labs (all labs ordered are listed, but only abnormal results are displayed) Labs Reviewed  BASIC METABOLIC PANEL - Abnormal; Notable for the following components:      Result Value   Glucose, Bld 106 (*)    BUN 22 (*)    Creatinine, Ser 1.77 (*)    GFR, Estimated 44 (*)    All other components within normal limits  BRAIN NATRIURETIC PEPTIDE - Abnormal; Notable for the following components:   B Natriuretic Peptide 255.8 (*)    All other components within normal limits  TROPONIN I (HIGH SENSITIVITY) - Abnormal; Notable for the following components:   Troponin I (High Sensitivity) 33 (*)    All other components within normal limits  TROPONIN I (HIGH SENSITIVITY) - Abnormal; Notable for the following components:   Troponin I (High Sensitivity) 31 (*)    All other components within normal limits  RESP PANEL BY RT-PCR (FLU A&B, COVID) ARPGX2  CBC   EKG EKG Interpretation  Date/Time:  Monday January 04 2022 08:36:54 EDT Ventricular Rate:  69 PR Interval:  152 QRS Duration: 102 QT Interval:  444 QTC Calculation: 475 R Axis:   6 Text Interpretation: Sinus rhythm with  occasional Premature ventricular complexes Septal infarct , age undetermined Abnormal ECG When compared with ECG of 29-Oct-2021 09:14, Nonspecific inferior T wave changes. Confirmed by Davonna Belling [phone]) on 01/04/2022 9:52:59 AM  Radiology DG Chest 2 View  Result Date: 01/04/2022 CLINICAL DATA:  Chest pain and dyspnea EXAM: CHEST - 2 VIEW COMPARISON:  Radiographs 10/29/2021 FINDINGS: Borderline cardiomegaly. Pulmonary vascular congestion. Slightly increased interstitial opacities in the lower lobes favored to represent edema. Chronic lung base scarring or atelectasis similar to prior. No pneumothorax or pleural effusion. Aortic calcification. No acute osseous abnormality. IMPRESSION: Increased pulmonary vascular congestion with possible mild edema compared to 10/29/2021. Electronically Signed   By: Placido Sou M.D.   On: 01/04/2022 09:14    Procedures Procedures   Medications Ordered in ED Medications  furosemide (LASIX) injection 80 mg (80 mg Intravenous Given 01/04/22 1209)    ED Course/ Medical Decision Making/ A&P                           Medical Decision Making Amount and/or Complexity of Data Reviewed Labs: ordered. Radiology: ordered.  Risk Prescription drug management.  This patient presents to the ED for concern of shortness of breath, this involves an extensive number of treatment options, and is a complaint that carries with it a high risk of complications and morbidity.  The differential diagnosis includes ACS, PE, pneumothorax, pleural effusion, tamponade, COPD exacerbation, CHF, asthma, pneumonia, etc.  Co morbidities that complicate the patient evaluation COPD, CHF  Additional history obtained:  Additional history obtained from: None External records from outside source obtained and reviewed including: Most recent ED physician note  EKG: EKG: normal EKG, normal sinus rhythm, PAC's noted.   Cardiac Monitoring: The patient was maintained on a cardiac  monitor.  I  personally viewed and interpreted the cardiac monitored which showed an underlying rhythm of: Sinus rhythm  Lab Results: I personally ordered, reviewed, and interpreted labs. Pertinent results include: CBC without leukocytosis BMP with mildly increased creatinine to 1.77, may be prerenal due to dehydration vs diuresis  COVID and flu negative Troponin 33 --> 31 BNP 255  Imaging Studies ordered:  I ordered imaging studies which included x-ray.  I independently reviewed & interpreted imaging & am in agreement with radiology impression. Imaging shows: Chest x-ray shows increased pulmonary vascular congestion and mild edema  Medications  I ordered medication including Lasix 80 mg IV for diuresis Reevaluation of the patient after medication shows that patient improved -I reviewed the patient's home medications and did make adjustments. -I did not prescribe new home medications.  Tests Considered: No further testing at this time  Critical Interventions: Diuresis  Consultations: N/A  SDH None identified  ED Course:  61 year old male who presents to the emergency department with shortness of breath.  Initial physical exam with mild tachypnea without hypoxia on room air.  He does have some bilateral lower lobe Rales. Chest x-ray notable for pulmonary congestion consistent with fluid volume overload and likely CHF exacerbation. Obtain labs without leukocytosis.  He has also been afebrile and there is no evidence of pneumonia on his chest x-ray.  COVID and flu negative. His BMP does show a creatinine of 1.77, however this appears decreased from previous values of up to 2. Troponin 33, 31, delta -2, doubt ACS.  EKG without ischemia or infarction. BMP shows level of 255, appears stable.  Proceeded with giving him 80 mg IV Lasix for diuresis.  He urinated over 2 L while here in the emergency department and feels improved from his presentation.  I then ambulated him while  monitoring his oxygen which showed that he did not desaturate.  He also was not significantly short of breath.  Think that this is likely CHF exacerbation.  Do not feel that other etiologies explain his symptoms such as PE, pneumothorax, myocarditis, pericarditis or COPD exacerbation.  He has no wheezing on exam.  I discussed with him at bedside option to go home with increased diuresis over the next week.  He is agreeable to this plan I will increase his torsemide to 60 mg daily for the next 4 days after today.  He can then return to his 40 mg daily until he is able to follow with cardiologist.  Given him strict return precautions for worsening symptoms.  He verbalized understanding.  After consideration of the diagnostic results and the patients response to treatment, I feel that the patent would benefit from discharge. The patient has been appropriately medically screened and/or stabilized in the ED. I have low suspicion for any other emergent medical condition which would require further screening, evaluation or treatment in the ED or require inpatient management. The patient is overall well appearing and non-toxic in appearance. They are hemodynamically stable at time of discharge.   Final Clinical Impression(s) / ED Diagnoses Final diagnoses:  Shortness of breath    Rx / DC Orders ED Discharge Orders     None         Mickie Hillier, PA-C 01/04/22 [place], Nathan, MD 01/04/22 1504

## 2022-01-04 NOTE — ED Notes (Signed)
Given soda. Declined happy meal/ sandwich. EDPA at [name].

## 2022-01-04 NOTE — ED Notes (Signed)
Ambulated in h/w with RN. SPO2 92-100% on RA. HR 77. Steady gait.

## 2022-01-04 NOTE — Discharge Instructions (Signed)
You were seen in the emergency department today. I would like you to increase your Torsemide to '60mg'$  (3 tablets) daily for the next 4 days starting tomorrow 01/05/22. Please return for worsening shortness of breath or chest pain. Please follow-up with your primary care provider or cardiology after you complete your increase in Torsemide to check in on your symptoms.

## 2022-01-11 ENCOUNTER — Telehealth: Payer: Self-pay | Admitting: Cardiology

## 2022-01-11 NOTE — Telephone Encounter (Signed)
I spoke with the patient. He advised that what he is needing is a letter stating his cardiac condition as he is due to start an Anger Management class (twice a week).  He would like in writing if he currently has any restrictions. He would also like input from the doctor if he is ok to take this class as it will most likely cause him to get very frustrated.  I advised the patient I will have Dr. Garen Lah review and we will call him back when a letter is ready.  The patient was needing this by 5pm today, but I have advised the patient that we cannot promise a same day turn around for this letter. He was then asking for a specific time this could be ready tomorrow. I advised the patient that I cannot confirm a specific time a letter could be ready, but we will call him back to let him know when/ if this is completed.  The patient voices understanding and is agreeable.

## 2022-01-11 NOTE — Telephone Encounter (Signed)
Pt would like a callback from nurse regarding picking up a letter for his job explaining his heart condition. Please advise

## 2022-01-12 ENCOUNTER — Telehealth: Payer: Self-pay | Admitting: Cardiology

## 2022-01-12 NOTE — Telephone Encounter (Signed)
Called patient and informed him that a letter has been written and is accessible through [place]. Patient requested a hard copy, letter place at front desk for pick up.  [name], MD  You; Emily Filbert, RN 19 hours ago (5:58 PM)   Faythe Ghee for patient to engage in anger management class from a cardiac perspective.

## 2022-01-12 NOTE — Telephone Encounter (Signed)
Patient is calling to talk with Dr. Garen Lah or nurse. Please call back

## 2022-01-13 NOTE — Telephone Encounter (Signed)
Attempted to call patient, the VM box was full.

## 2022-01-14 NOTE — Telephone Encounter (Signed)
Patient returning call.

## 2022-01-15 ENCOUNTER — Telehealth: Payer: Self-pay | Admitting: Cardiology

## 2022-01-15 ENCOUNTER — Other Ambulatory Visit: Payer: Self-pay | Admitting: *Deleted

## 2022-01-15 NOTE — Patient Instructions (Signed)
Visit Information  [name] was given information about Medicaid Managed Care team care coordination services as a part of their [facility] Medicaid benefit. Chris [name] verbally consented to engagement with the [facility] Managed Care team.   If you are experiencing a medical emergency, please call 911 or report to your local emergency department or urgent care.   If you have a non-emergency medical problem during routine business hours, please contact your provider's office and ask to speak with a nurse.   For questions related to your Healthy [facility] health plan, please call: [phone] or visit the homepage here: [email or web address]  If you would like to schedule transportation through your Healthy [facility] plan, please call the following number at least 2 days in advance of your appointment: [phone]  For information about your ride after you set it up, call Ride Assist at [phone]. Use this number to activate a Will Call pickup, or if your transportation is late for a scheduled pickup. Use this number, too, if you need to make a change or cancel a previously scheduled reservation.  If you need transportation services right away, call [phone]. The after-hours call center is staffed 24 hours to handle ride assistance and urgent reservation requests (including discharges) 365 days a year. Urgent trips include sick visits, hospital discharge requests and life-sustaining treatment.  Call the [place] at [phone], at any time, 24 hours a day, 7 days a week. If you are in danger or need immediate medical attention call 911.  If you would like help to quit smoking, call 1-800-QUIT-NOW 785-196-0737) OR Espaol: 1-855-Djelo-Ya (7-035-009-3818) o para ms informacin haga clic aqu or Text READY to 200-400 to register via text  Chris [name],   Please see education materials related to CHF provided as  print materials.   The patient verbalized understanding of instructions, educational materials, and care plan provided today and agreed to receive a mailed copy of patient instructions, educational materials, and care plan.   Telephone follow up appointment with Managed Medicaid care management team member scheduled for:02/16/22 @ 3:45pm  [name] RN, BSN [place] RN Care Coordinator   Following is a copy of your plan of care:  Care Plan : RN Care Manager Plan of Care  Updates made by [name], RN since 01/15/2022 12:00 AM     Problem: Knowledge Deficits and Care Coordination Needs related to long term management of CHF   Priority: High     Long-Range Goal: Development of Plan of Care to address Care Coordination Needs and Knowledge Deficits related to CHF   Start Date: 04/28/2021  Expected End Date: 02/16/2022  Priority: High  Note:   Current Barriers:  Knowledge Deficits related to plan of care for management of CHF  Care Coordination needs related to Transportation  Non-adherence to prescribed medication regimen Difficulty obtaining medications Chris [name] reports arranging transportation to appointments on 01/26/22 and 01/29/22. He plans to attend both appointments.He is requesting a letter regarding his health.  RNCM Clinical Goal(s):  Patient will verbalize understanding of plan for management of CHF as evidenced by patient verbalization of plan take all medications exactly as prescribed and will call provider for medication related questions as evidenced by physician notes in EMR    attend all scheduled medical appointments: Echocardiogram 01/26/22 and Cardiology 01/29/22 as evidenced by physician notes in EMR        demonstrate improved adherence to prescribed treatment plan for CHF as  evidenced by attending Cardiology 10/15/21 @ 8:40am work with pharmacist to address Medication procurement related to CHF as evidenced by review of EMR and  patient or pharmacist report    through collaboration with Consulting civil engineer, provider, and care team.   Interventions: Inter-disciplinary care team collaboration (see longitudinal plan of care) Evaluation of current treatment plan related to  self management and patient's adherence to plan as established by provider Advised patient to take all medications with him to all appointments Advised patient to contact [facility] as directed by Dr. Wynetta Emery at last appointment Advised patient to discuss needed letter with Dr. Garen Lah during his appointment on 01/29/22   GI issues  (Status:  Condition stable.  Not addressed this visit.)  Long Term Goal Evaluation of current treatment plan related to  GI issues ,  self-management and patient's adherence to plan as established by provider. Discussed plans with patient for ongoing care management follow up and provided patient with direct contact information for care management team Evaluation of current treatment plan related to GI and patient's adherence to plan as established by provider Provided education to patient re: diverticulosis Reviewed scheduled/upcoming provider appointments including Advised patient to discuss any abdominal changes with pain or bowel movements with provider   COPD Interventions:  (Status:  Condition stable.  Not addressed this visit.) Long Term Goal Advised patient to track and manage COPD triggers Advised patient to self assesses COPD action plan zone and make appointment with provider if in the yellow zone for 48 hours without improvement Discussed the importance of adequate rest and management of fatigue with COPD Assessed social determinant of health barriers Advised patient to discuss concerns with provider at next visit Advised patient to pick up medication refills and take as directed Advised patient to avoid spending time outside while the air quality is bad   Heart Failure Interventions:  (Status: Goal on  Track (progressing): YES.)  Long Term Goal  Provided education on low sodium diet Discussed the importance of keeping all appointments with provider Provided patient with education about the role of exercise in the management of heart failure Assessed social determinant of health barriers Reviewed upcoming appointments for echocardiogram and Cardiology, patient has arranged transportation with [employer] Advised patient to take all medications to his provider appointments  Patient Goals/Self-Care Activities: Patient will self administer medications as prescribed as evidenced by self report/primary caregiver report  Patient will attend all scheduled provider appointments as evidenced by clinician review of documented attendance to scheduled appointments and patient/caregiver report Patient will attend church or other social activities as evidenced by patient report Patient will continue to perform ADL's independently as evidenced by patient/caregiver report Patient will continue to perform IADL's independently as evidenced by patient/caregiver report Patient will call provider office for new concerns or questions as evidenced by review of documented incoming telephone call notes and patient report Patient will work with [facility] Pharmacist for medication management as evidenced by notes in EMR call office if I gain more than 2 pounds in one day or 5 pounds in one week use salt in moderation watch for swelling in feet, ankles and legs every day weigh myself daily know when to call the doctor

## 2022-01-15 NOTE — Telephone Encounter (Signed)
See duplicate phone encounter

## 2022-01-15 NOTE — Telephone Encounter (Signed)
Patient walked in asking for a letter for his work from Dr. Mylo Red stating his heart condition. States he needs it asap before his next appt. Please call and assist.

## 2022-01-15 NOTE — Telephone Encounter (Signed)
Called patient and he requested that his anger management clearance letter be re-written to state, "that he is high risk for a heart attack, and that his heart is not 100%". He wanted his diagnoses (HFrEF, A Flutter) added with the above clause. I informed him that I could include his diagnoses, and that he is not restricted from Anger Management classes. Patient got very angry and stated, "I do not want you to be my nurse anymore, I will talk to Dr. Garen Lah on the 15th.. Smart Ass". Patient then hung up on me.  Patient has an Echo scheduled in office on 01/26/22, and follow up with [facility] or-[facility]ang on 01/29/22.

## 2022-01-15 NOTE — Patient Outreach (Signed)
Medicaid Managed Care   Nurse Care Manager Note  01/15/2022 Name:  Chris [name] MRN:  [id number] DOB:  18-Sep-1960  Chris [name] is an 61 y.o. year old male who is a primary patient of [name], MD.  The [facility] Managed Care Coordination team was consulted for assistance with:    CHF  Mr. Brine was given information about Medicaid Managed Care Coordination team services today. Chris [name] Patient agreed to services and verbal consent obtained.  Engaged with patient by telephone for follow up visit in response to provider referral for case management and/or care coordination services.   Assessments/Interventions:  Review of past medical history, allergies, medications, health status, including review of consultants reports, laboratory and other test data, was performed as part of comprehensive evaluation and provision of chronic care management services.  SDOH (Social Determinants of Health) assessments and interventions performed:   Care Plan  No Known Allergies  Medications Reviewed Today     Reviewed by [name], RN (Registered Nurse) on 01/15/22 at 95  Med List Status: <None>   Medication Order Taking? Sig Documenting Provider Last Dose Status Informant  Accu-Chek Softclix Lancets lancets [id number] No Use as instructed  Patient not taking: Reported on 11/16/2021   [name], MD Not Taking Active Self, Pharmacy Records  albuterol (PROVENTIL) (2.5 MG/3ML) 0.083% nebulizer solution [id number] No TAKE 3 MLS BY NEBULIZATION EVERY 6 (SIX) HOURS AS NEEDED FOR SHORTNESS OF BREATH.  Patient not taking: Reported on 12/30/2021   [name], MD Not Taking Active Self, Pharmacy Records  albuterol (VENTOLIN HFA) 108 (90 Base) MCG/ACT inhaler [id number] No Inhale 2 puffs into the lungs every 6 (six) hours as needed for wheezing or shortness of breath. [name], MD Taking Active Self, Pharmacy Records  allopurinol (ZYLOPRIM) 100 MG tablet  [id number]  TAKE 2 TABLETS (200 MG TOTAL) BY MOUTH DAILY. [name], MD  Active   aspirin 81 MG EC tablet [id number] No Take 1 tablet (81 mg total) by mouth daily. [name], MD Unknown Active Self, Pharmacy Records           Med Note Chris [name] Apr 27, 2021  1:27 PM)    atorvastatin (LIPITOR) 40 MG tablet [id number] No Take 1 tablet (40 mg total) by mouth daily. [name], MD Unknown Active Self, Pharmacy Records           Med Note Chris [name] Apr 27, 2021  1:29 PM)    Blood Glucose Monitoring Suppl (ACCU-CHEK GUIDE) w/Device KIT [id number] No Use as directed  Patient not taking: Reported on 11/16/2021   [name], MD Not Taking Active Self, Pharmacy Records  budesonide-formoterol [facility]) 160-4.5 MCG/ACT inhaler [id number] No Inhale 2 puffs into the lungs 2 (two) times daily. [name], MD Unknown Active Self, Pharmacy Records           Med Note Chris [name] Apr 27, 2021  1:29 PM)    carvedilol (COREG) 6.25 MG tablet [id number] No Take 1 tablet (6.25 mg total) by mouth 2 (two) times daily. Chris Graff, FNP Unknown Active Self, Pharmacy Records           Med Note Chris [name] Oct 29, 2021 10:02 AM) Last dispense date is January but pt states he took this yesterday  colchicine 0.6 MG tablet [id number] No Take 2 tabs (1.2 mg)  at the onset of a gout flare, may repeat 1 tab (0.6 mg) after 2 hours if symptoms persist. [name], MD Unknown Active Self, Pharmacy Records  dapagliflozin propanediol (FARXIGA) 10 MG TABS tablet [id number] No Take 1 tablet (10 mg total) by mouth daily. Chris Graff, FNP Unknown Active Self, Pharmacy Records           Med Note Chris [name] Oct 29, 2021 10:02 AM) Last dispense date was January but pt states he took this yesterday  glipiZIDE (GLUCOTROL) 5 MG tablet [id number] No Take 0.5 tablets (2.5 mg total) by mouth daily before breakfast. [name],  MD Unknown Active Self, Pharmacy Records           Med Note Chris [name], [facility] Oct 29, 2021 10:01 AM) Pt states he also took this yesterday even though dispense dates he should be out of it  glucose blood (ACCU-CHEK GUIDE) test strip [id number] No Use as directed to check blood sugar 1-2 times a day  Patient not taking: Reported on 11/16/2021   [name], MD Not Taking Active Self, Pharmacy Records  isosorbide-hydrALAZINE (BIDIL) 20-37.5 MG tablet [id number] No Take 1 tablet by mouth 3 (three) times daily. [name], MD Unknown Active Self, Pharmacy Records           Med Note Chris [name], [facility]   Thu Oct 29, 2021 10:00 AM) Pt states he took this yesterday but it looks like it hasnt be dispensed since march  ketorolac (ACULAR) 0.5 % ophthalmic solution [id number] No Place 1 drop into the right eye 4 (four) times daily. Chris Mew, MD Unknown Active   Discontinued 07/11/20 [phone] (Discontinued by provider)   montelukast (SINGULAIR) 10 MG tablet [id number] No TAKE 1 TABLET (10 MG TOTAL) BY MOUTH AT BEDTIME. [name], MD Unknown Active Self, Pharmacy Records  polyethylene glycol powder [facility]) 17 GM/SCOOP powder [id number] No Take 17 g by mouth daily as needed for mild constipation. [name], MD Unknown Active   predniSONE (DELTASONE) 20 MG tablet [id number] No Take 2 tablets (40 mg total) by mouth daily. Chris Cecil, PA-C Unknown Active   torsemide (DEMADEX) 20 MG tablet [id number] No Take 2 tablets (40 mg total) by mouth daily. [name], MD Unknown Expired 12/30/21 2359 Self, Pharmacy Records            Patient Active Problem List   Diagnosis Date Noted   Right kidney mass 05/14/2021   CHF exacerbation ([place]) 04/14/2021   Chest pain 04/14/2021   Syncope 04/14/2021   Left-sided weakness 10/28/2020   COPD exacerbation (HCC)    Typical atrial flutter (HCC)    CHF (congestive heart failure) ([place]) 07/04/2020   Acute  exacerbation of CHF (congestive heart failure) ([place]) 06/16/2020   COPD with acute exacerbation ([place]) 06/16/2020   Influenza vaccine refused 05/06/2020   Acute on chronic combined systolic (congestive) and diastolic (congestive) heart failure ([place]) 05/05/2020   Acute decompensated heart failure ([place]) 05/04/2020   Illiteracy 05/04/2020   Type 2 diabetes mellitus with stage 3 chronic kidney disease ([place]) 12/25/2019   Acute on chronic combined systolic and diastolic CHF (congestive heart failure) ([place]) 10/26/2019   Elevated troponin I level 10/26/2019   Acute on chronic diastolic (congestive) heart failure ([place]) 10/26/2019   History of gout 02/01/2019   Seasonal allergic rhinitis due to pollen 02/01/2019   Tobacco dependence 11/30/2018   Microscopic hematuria 11/30/2018   Depression 11/30/2018  Difficulty controlling anger 11/30/2018   CAP (community acquired pneumonia) 08/11/2018   COPD (chronic obstructive pulmonary disease) (HCC)    CKD (chronic kidney disease) stage 3, GFR 30-59 ml/min (HCC) 08/10/2018   Recurrent epistaxis 04/21/2018   Mixed hyperlipidemia 07/28/2017   Essential hypertension 94/17/4081   Chronic systolic heart failure ([place]) 10/25/2014   Cocaine abuse ([place]) 02/20/2013   Cannabis abuse 02/20/2013   Back pain, chronic 02/20/2013    Conditions to be addressed/monitored per PCP order:  CHF  Care Plan : RN Care Manager Plan of Care  Updates made by [name], RN since 01/15/2022 12:00 AM     Problem: Knowledge Deficits and Care Coordination Needs related to long term management of CHF   Priority: High     Long-Range Goal: Development of Plan of Care to address Care Coordination Needs and Knowledge Deficits related to CHF   Start Date: 04/28/2021  Expected End Date: 02/16/2022  Priority: High  Note:   Current Barriers:  Knowledge Deficits related to plan of care for management of CHF  Care Coordination needs related to Transportation  Non-adherence to  prescribed medication regimen Difficulty obtaining medications Mr. Brogden reports arranging transportation to appointments on 01/26/22 and 01/29/22. He plans to attend both appointments.He is requesting a letter regarding his health.  RNCM Clinical Goal(s):  Patient will verbalize understanding of plan for management of CHF as evidenced by patient verbalization of plan take all medications exactly as prescribed and will call provider for medication related questions as evidenced by physician notes in EMR    attend all scheduled medical appointments: Echocardiogram 01/26/22 and Cardiology 01/29/22 as evidenced by physician notes in EMR        demonstrate improved adherence to prescribed treatment plan for CHF as evidenced by attending Cardiology 10/15/21 @ 8:40am work with pharmacist to address Medication procurement related to CHF as evidenced by review of EMR and patient or pharmacist report    through collaboration with Consulting civil engineer, provider, and care team.   Interventions: Inter-disciplinary care team collaboration (see longitudinal plan of care) Evaluation of current treatment plan related to  self management and patient's adherence to plan as established by provider Advised patient to take all medications with him to all appointments Advised patient to contact [facility] as directed by Dr. Wynetta Emery at last appointment Advised patient to discuss needed letter with Dr. Garen Lah during his appointment on 01/29/22   GI issues  (Status:  Condition stable.  Not addressed this visit.)  Long Term Goal Evaluation of current treatment plan related to  GI issues ,  self-management and patient's adherence to plan as established by provider. Discussed plans with patient for ongoing care management follow up and provided patient with direct contact information for care management team Evaluation of current treatment plan related to GI and patient's adherence to plan as established by  provider Provided education to patient re: diverticulosis Reviewed scheduled/upcoming provider appointments including Advised patient to discuss any abdominal changes with pain or bowel movements with provider   COPD Interventions:  (Status:  Condition stable.  Not addressed this visit.) Long Term Goal Advised patient to track and manage COPD triggers Advised patient to self assesses COPD action plan zone and make appointment with provider if in the yellow zone for 48 hours without improvement Discussed the importance of adequate rest and management of fatigue with COPD Assessed social determinant of health barriers Advised patient to discuss concerns with provider at next visit Advised patient to pick  up medication refills and take as directed Advised patient to avoid spending time outside while the air quality is bad   Heart Failure Interventions:  (Status: Goal on Track (progressing): YES.)  Long Term Goal  Provided education on low sodium diet Discussed the importance of keeping all appointments with provider Provided patient with education about the role of exercise in the management of heart failure Assessed social determinant of health barriers Reviewed upcoming appointments for echocardiogram and Cardiology, patient has arranged transportation with [employer] Advised patient to take all medications to his provider appointments  Patient Goals/Self-Care Activities: Patient will self administer medications as prescribed as evidenced by self report/primary caregiver report  Patient will attend all scheduled provider appointments as evidenced by clinician review of documented attendance to scheduled appointments and patient/caregiver report Patient will attend church or other social activities as evidenced by patient report Patient will continue to perform ADL's independently as evidenced by patient/caregiver report Patient will continue to perform IADL's independently as evidenced  by patient/caregiver report Patient will call provider office for new concerns or questions as evidenced by review of documented incoming telephone call notes and patient report Patient will work with [facility] Pharmacist for medication management as evidenced by notes in EMR call office if I gain more than 2 pounds in one day or 5 pounds in one week use salt in moderation watch for swelling in feet, ankles and legs every day weigh myself daily know when to call the doctor        Follow Up:  Patient agrees to Care Plan and Follow-up.  Plan: The Managed Medicaid care management team will reach out to the patient again over the next 30 days.  Date/time of next scheduled RN care management/care coordination outreach:  02/16/22 @ 3:45pm  [name] RN, BSN [place]  [employer] manager

## 2022-01-15 NOTE — Progress Notes (Signed)
Called patient and he requested that his anger management clearance letter be re-written to state, "that he is high risk for a heart attack, and that his heart is not 100%". He wanted his diagnoses (HFrEF, A Flutter) added with the above clause. I informed him that I could include his diagnoses, and that he is not restricted from Anger Management classes. Patient got very angry and stated, "I do not want you to be my nurse anymore, I will talk to Dr. Garen Lah on the 15th.. Smart Ass". Patient then hung up on me.  Patient has an Echo scheduled in office on 01/26/22, and follow up with Agb or-Etang on 01/29/22.

## 2022-01-26 ENCOUNTER — Ambulatory Visit (INDEPENDENT_AMBULATORY_CARE_PROVIDER_SITE_OTHER): Payer: Medicaid Other

## 2022-01-26 DIAGNOSIS — I4892 Unspecified atrial flutter: Secondary | ICD-10-CM

## 2022-01-26 DIAGNOSIS — I5020 Unspecified systolic (congestive) heart failure: Secondary | ICD-10-CM | POA: Diagnosis not present

## 2022-01-26 LAB — ECHOCARDIOGRAM COMPLETE
AR max vel: 3.27 cm2
AV Area VTI: 2.97 cm2
AV Area mean vel: 3.21 cm2
AV Mean grad: 4 mmHg
AV Peak grad: 9.6 mmHg
Ao pk vel: 1.55 m/s
Area-P 1/2: 2.99 cm2
Calc EF: 49.4 %
S' Lateral: 4.7 cm
Single Plane A2C EF: 46 %
Single Plane A4C EF: 51.7 %

## 2022-01-29 ENCOUNTER — Telehealth: Payer: Self-pay | Admitting: Cardiology

## 2022-01-29 ENCOUNTER — Ambulatory Visit (INDEPENDENT_AMBULATORY_CARE_PROVIDER_SITE_OTHER): Payer: Medicaid Other | Admitting: Cardiology

## 2022-01-29 ENCOUNTER — Encounter: Payer: Self-pay | Admitting: Cardiology

## 2022-01-29 VITALS — BP 162/100 | HR 62 | Ht 72.0 in | Wt 264.6 lb

## 2022-01-29 DIAGNOSIS — I10 Essential (primary) hypertension: Secondary | ICD-10-CM | POA: Diagnosis not present

## 2022-01-29 DIAGNOSIS — I517 Cardiomegaly: Secondary | ICD-10-CM | POA: Diagnosis not present

## 2022-01-29 DIAGNOSIS — I5043 Acute on chronic combined systolic (congestive) and diastolic (congestive) heart failure: Secondary | ICD-10-CM

## 2022-01-29 DIAGNOSIS — I5020 Unspecified systolic (congestive) heart failure: Secondary | ICD-10-CM

## 2022-01-29 DIAGNOSIS — F17200 Nicotine dependence, unspecified, uncomplicated: Secondary | ICD-10-CM

## 2022-01-29 DIAGNOSIS — I4892 Unspecified atrial flutter: Secondary | ICD-10-CM

## 2022-01-29 DIAGNOSIS — R062 Wheezing: Secondary | ICD-10-CM

## 2022-01-29 DIAGNOSIS — R0602 Shortness of breath: Secondary | ICD-10-CM

## 2022-01-29 NOTE — Patient Instructions (Signed)
Medication Instructions:  Your physician recommends that you continue on your current medications as directed. Please refer to the Current Medication list given to you today.  *If you need a refill on your cardiac medications before your next appointment, please call your pharmacy*   Follow-Up: At [facility], you and your health needs are our priority.  As part of our continuing mission to provide you with exceptional heart care, we have created designated Provider Care Teams.  These Care Teams include your primary Cardiologist (physician) and Advanced Practice Providers (APPs -  Physician Assistants and Nurse Practitioners) who all work together to provide you with the care you need, when you need it.  We recommend signing up for the patient portal called "MyChart".  Sign up information is provided on this After Visit Summary.  MyChart is used to connect with patients for Virtual Visits (Telemedicine).  Patients are able to view lab/test results, encounter notes, upcoming appointments, etc.  Non-urgent messages can be sent to your provider as well.   To learn more about what you can do with MyChart, go to [email or web address].    Your next appointment:   3 month(s)  The format for your next appointment:   In Person  Provider:    ONLY WITH [name], MD     Important Information About Sugar

## 2022-01-29 NOTE — Progress Notes (Signed)
Cardiology Office Note:    Date:  01/29/2022   ID:  Chris [name], DOB 12/24/1960, MRN [id number]  PCP:  Whalley, Deborah B, MD  [facility] Cardiologist:   [facility] Electrophysiologist:  None   Referring MD: Haft, Deborah B, MD   Chief Complaint  Patient presents with   follow up    3 month ECHO follow up, SOB, Tightness in chest, bilateral arm pain and numbness    History of Present Illness:    Chris [name] is a 61 y.o. male with a hx of hypertension, obesity, HFrEF EF 25%, atrial flutter s/p DCCV 06/2020, COPD, current smoker x40+ years, former cocaine use x25+ years, CKD who presents for follow-up.    Being seen for nonischemic cardiomyopathy and medication titration.  Tolerating current doses of BiDil, Coreg, torsemide.  Repeat echocardiogram obtained to evaluate cardiac function.  He states not feeling good, describes shortness of breath with minimal exertion, also endorses wheezing.  He still smokes, but has cut back, currently smokes about 4 cigarettes a day.  Has a family history of congestive heart failure in father, brother and sister.   Prior notes Lexiscan Myoview 07/2021 fixed inferior inferolateral defect, no significant coronary calcifications, no significant ischemia. Suddenly admitted for shortness of breath, found to be in atrial flutter with RVR.  Family history of congestive heart failure, unsure if this is HCM. Underwent a TEE guided DC cardioversion 06/2020 .  Echocardiogram obtained 06/2020 while in the hospital showed EF of 25%.  left heart cath 2005 at [facility] years ago with no obstructive disease.  Past Medical History:  Diagnosis Date   Arrhythmia    atrial flutter   CHF (congestive heart failure) (HCC)    Chronic kidney disease    COPD (chronic obstructive pulmonary disease) (HCC)    Coronary artery disease    Depression    Diabetes mellitus without complication (HCC)    GERD (gastroesophageal reflux disease)    Gout    Hypertension     Influenza A with respiratory manifestations    Mental disorder     Past Surgical History:  Procedure Laterality Date   ANKLE SURGERY     CARDIAC CATHETERIZATION     CARDIOVERSION N/A 07/08/2020   Procedure: CARDIOVERSION;  Surgeon: Kowalski, Bruce J, MD;  Location: [facility];  Service: Cardiovascular;  Laterality: N/A;   COLONOSCOPY WITH PROPOFOL N/A 11/19/2021   Procedure: COLONOSCOPY WITH PROPOFOL;  Surgeon: Cunningham, Scott E, MD;  Location: [facility];  Service: Gastroenterology;  Laterality: N/A;   HERNIA REPAIR     x2   POLYPECTOMY  11/19/2021   Procedure: POLYPECTOMY;  Surgeon: Cunningham, Scott E, MD;  Location: [facility];  Service: Gastroenterology;;   SHOULDER SURGERY     TEE WITHOUT CARDIOVERSION N/A 07/08/2020   Procedure: TRANSESOPHAGEAL ECHOCARDIOGRAM (TEE);  Surgeon: Kowalski, Bruce J, MD;  Location: [facility];  Service: Cardiovascular;  Laterality: N/A;    Current Medications: Current Meds  Medication Sig   albuterol (VENTOLIN HFA) 108 (90 Base) MCG/ACT inhaler Inhale 2 puffs into the lungs every 6 (six) hours as needed for wheezing or shortness of breath.   allopurinol (ZYLOPRIM) 100 MG tablet TAKE 2 TABLETS (200 MG TOTAL) BY MOUTH DAILY.   aspirin 81 MG EC tablet Take 1 tablet (81 mg total) by mouth daily.   atorvastatin (LIPITOR) 40 MG tablet Take 1 tablet (40 mg total) by mouth daily.   Blood Glucose Monitoring Suppl (ACCU-CHEK GUIDE) w/Device KIT Use as directed   budesonide-formoterol (  SYMBICORT) 160-4.5 MCG/ACT inhaler Inhale 2 puffs into the lungs 2 (two) times daily.   carvedilol (COREG) 6.25 MG tablet Take 1 tablet (6.25 mg total) by mouth 2 (two) times daily.   colchicine 0.6 MG tablet Take 2 tabs (1.2 mg) at the onset of a gout flare, may repeat 1 tab (0.6 mg) after 2 hours if symptoms persist.   dapagliflozin propanediol (FARXIGA) 10 MG TABS tablet Take 1 tablet (10 mg total) by mouth daily.   glipiZIDE (GLUCOTROL) 5 MG tablet Take 0.5 tablets (2.5 mg  total) by mouth daily before breakfast.   glucose blood (ACCU-CHEK GUIDE) test strip Use as directed to check blood sugar 1-2 times a day   isosorbide-hydrALAZINE (BIDIL) 20-37.5 MG tablet Take 1 tablet by mouth 3 (three) times daily.   montelukast (SINGULAIR) 10 MG tablet TAKE 1 TABLET (10 MG TOTAL) BY MOUTH AT BEDTIME.   polyethylene glycol powder (GLYCOLAX/MIRALAX) 17 GM/SCOOP powder Take 17 g by mouth daily as needed for mild constipation.   predniSONE (DELTASONE) 20 MG tablet Take 2 tablets (40 mg total) by mouth daily.     Allergies:   Patient has no known allergies.   Social History   Socioeconomic History   Marital status: Divorced    Spouse name: Not on file   Number of children: 3   Years of education: Not on file   Highest education level: High school graduate  Occupational History   Occupation: disability  Tobacco Use   Smoking status: Every Day    Packs/day: 1.00    Years: 43.00    Total pack years: 43.00    Types: Cigarettes   Smokeless tobacco: Never   Tobacco comments:    4 cigarettes/day x 2 month (04/14/2021)  Vaping Use   Vaping Use: Never used  Substance and Sexual Activity   Alcohol use: No   Drug use: Yes    Frequency: 21.0 times per week    Types: Marijuana, Cocaine    Comment: last use Cocaine- 03/28/2021. Still using marijuana   Sexual activity: Not on file  Other Topics Concern   Not on file  Social History Narrative   ** Merged History Encounter **       Social Determinants of Health   Financial Resource Strain: Medium Risk (06/19/2021)   Overall Financial Resource Strain (CARDIA)    Difficulty of Paying Living Expenses: Somewhat hard  Food Insecurity: No Food Insecurity (04/14/2021)   Hunger Vital Sign    Worried About Running Out of Food in the Last Year: Never true    Ran Out of Food in the Last Year: Never true  Transportation Needs: Unmet Transportation Needs (10/13/2021)   PRAPARE - Transportation    Lack of Transportation (Medical):  Yes    Lack of Transportation (Non-Medical): Yes  Physical Activity: Insufficiently Active (11/16/2021)   Exercise Vital Sign    Days of Exercise per Week: 5 days    Minutes of Exercise per Session: 20 min  Stress: Not on file  Social Connections: Moderately Isolated (04/28/2021)   Social Connection and Isolation Panel [NHANES]    Frequency of Communication with Friends and Family: More than three times a week    Frequency of Social Gatherings with Friends and Family: Once a week    Attends Religious Services: More than 4 times per year    Active Member of Clubs or Organizations: No    Attends Club or Organization Meetings: Never    Marital Status: Divorced       Family History: The patient's family history includes Diabetes in his mother; HIV in his brother; Healthy in his daughter and son; Heart disease in his father.  ROS:   Please see the history of present illness.     All other systems reviewed and are negative.  EKGs/Labs/Other Studies Reviewed:    The following studies were reviewed today:   EKG:  EKG is ordered today.  EKG shows sinus rhythm with PVCs.  Recent Labs: 02/11/2021: Magnesium 2.0 04/27/2021: ALT 23 01/04/2022: B Natriuretic Peptide 255.8; BUN 22; Creatinine, Ser 1.77; Hemoglobin 14.6; Platelets 211; Potassium 3.8; Sodium 140  Recent Lipid Panel    Component Value Date/Time   CHOL 176 12/25/2019 0632   CHOL 160 11/07/2019 0918   CHOL 157 10/21/2013 0435   TRIG 91 12/25/2019 0632   TRIG 117 10/21/2013 0435   HDL 37 (L) 12/25/2019 0632   HDL 40 11/07/2019 0918   HDL 30 (L) 10/21/2013 0435   CHOLHDL 4.8 12/25/2019 0632   VLDL 18 12/25/2019 0632   VLDL 23 10/21/2013 0435   LDLCALC 121 (H) 12/25/2019 0632   LDLCALC 92 11/07/2019 0918   LDLCALC 104 (H) 10/21/2013 0435     Risk Assessment/Calculations:      Physical Exam:    VS:  BP (!) 162/100 (BP Location: Left Arm, Patient Position: Lying left side, Cuff Size: Large)   Pulse 62   Ht 6' (1.829  m)   Wt 264 lb 9.6 oz (120 kg)   SpO2 97%   BMI 35.89 kg/m     Wt Readings from Last 3 Encounters:  01/29/22 264 lb 9.6 oz (120 kg)  01/04/22 264 lb 8.8 oz (120 kg)  12/21/21 265 lb (120.2 kg)     GEN:  Well nourished, well developed in no acute distress HEENT: Normal NECK: No JVD; No carotid bruits CARDIAC: RRR, no murmurs, rubs, gallop RESPIRATORY: Decreased breath sounds at bases, expiratory wheezing ABDOMEN: Soft, non-tender, distended MUSCULOSKELETAL:  No edema; No deformity  SKIN: Warm and dry NEUROLOGIC:  Alert and oriented x 3 PSYCHIATRIC:  Normal affect   ASSESSMENT:    1. HFrEF (heart failure with reduced ejection fraction) ([place])   2. LVH (left ventricular hypertrophy)   3. Atrial flutter, unspecified type ([place])   4. Primary hypertension   5. Smoking   6. Wheezing   7. Shortness of breath     PLAN:    In order of problems listed above:  HFrEF, EF 25%, likely nonischemic secondary to cocaine use versus uncontrolled hypertension.  Repeat echo shows improvement in EF to 50%.  Describes NYHA class II-III symptoms.  Continue BiDil 1 tab 3 times daily.  Coreg 6.25 mg twice daily, Farxiga.   Shortness of breath likely from COPD, expiratory wheezing, patient is euvolemic. LVH, severe asymmetric septal hypertrophy on echo, suggesting HCM.  No LVOT obstruction.  Plan CMR at follow-up visit as patient is currently short of breath, not able to lay flat.  Continue Coreg. Atrial flutter, CHA2DS2-VASc 3.  Maintaining sinus rhythm.  Coreg, Eliquis Hypertension, BP controlled.  Continue Coreg, BiDil, torsemide. Patient is a current smoker, cessation advised.   Shortness of breath, expiratory wheezing, no edema, refer to pulmonary medicine for COPD management.  Follow-up in 3 months.   Medication Adjustments/Labs and Tests Ordered: Current medicines are reviewed at length with the patient today.  Concerns regarding medicines are outlined above.  Orders Placed This Encounter   Procedures   Ambulatory referral to Pulmonology   No orders of  the defined types were placed in this encounter.   Patient Instructions  Medication Instructions:  Your physician recommends that you continue on your current medications as directed. Please refer to the Current Medication list given to you today.  *If you need a refill on your cardiac medications before your next appointment, please call your pharmacy*   Follow-Up: At [facility], you and your health needs are our priority.  As part of our continuing mission to provide you with exceptional heart care, we have created designated Provider Care Teams.  These Care Teams include your primary Cardiologist (physician) and Advanced Practice Providers (APPs -  Physician Assistants and Nurse Practitioners) who all work together to provide you with the care you need, when you need it.  We recommend signing up for the patient portal called "MyChart".  Sign up information is provided on this After Visit Summary.  MyChart is used to connect with patients for Virtual Visits (Telemedicine).  Patients are able to view lab/test results, encounter notes, upcoming appointments, etc.  Non-urgent messages can be sent to your provider as well.   To learn more about what you can do with MyChart, go to [email or web address].    Your next appointment:   3 month(s)  The format for your next appointment:   In Person  Provider:    ONLY WITH [name], MD     Important Information About Sugar         Signed, [name], MD  01/29/2022 11:24 AM    [place]

## 2022-01-29 NOTE — Telephone Encounter (Signed)
Per Dr. Garen Lah the patient should have a bmp on 02/03/22 the same day he sees his Pulmonologist.  Called the patient and made him aware. Adv the patient that the lab is to be drawn  at the [facility]. Adv the pt that the bmp is non-fasting. He should stop at the Registration desk to check in. Patient verbalized understanding and voiced appreciation for the call.

## 2022-02-02 NOTE — Addendum Note (Signed)
Addended by: Nestor Ramp on: 02/02/2022 11:51 AM   Modules accepted: Orders

## 2022-02-03 ENCOUNTER — Telehealth: Payer: Self-pay | Admitting: Emergency Medicine

## 2022-02-03 ENCOUNTER — Other Ambulatory Visit: Payer: Self-pay | Admitting: Internal Medicine

## 2022-02-03 ENCOUNTER — Other Ambulatory Visit: Payer: Self-pay

## 2022-02-03 ENCOUNTER — Encounter: Payer: Self-pay | Admitting: Student in an Organized Health Care Education/Training Program

## 2022-02-03 ENCOUNTER — Ambulatory Visit (INDEPENDENT_AMBULATORY_CARE_PROVIDER_SITE_OTHER): Payer: Medicaid Other | Admitting: Student in an Organized Health Care Education/Training Program

## 2022-02-03 ENCOUNTER — Other Ambulatory Visit
Admission: RE | Admit: 2022-02-03 | Discharge: 2022-02-03 | Disposition: A | Discharge status: Home / Self Care | Payer: Medicaid Other | Source: Ambulatory Visit | Attending: Cardiology | Admitting: Cardiology

## 2022-02-03 VITALS — BP 138/84 | HR 75 | Temp 97.9°F | Ht 72.0 in | Wt 259.0 lb

## 2022-02-03 DIAGNOSIS — F17200 Nicotine dependence, unspecified, uncomplicated: Secondary | ICD-10-CM

## 2022-02-03 DIAGNOSIS — J449 Chronic obstructive pulmonary disease, unspecified: Secondary | ICD-10-CM

## 2022-02-03 DIAGNOSIS — I5043 Acute on chronic combined systolic (congestive) and diastolic (congestive) heart failure: Secondary | ICD-10-CM | POA: Insufficient documentation

## 2022-02-03 DIAGNOSIS — E1169 Type 2 diabetes mellitus with other specified complication: Secondary | ICD-10-CM

## 2022-02-03 LAB — BASIC METABOLIC PANEL
Anion gap: 9 (ref 5–15)
BUN: 43 mg/dL — ABNORMAL HIGH (ref 6–20)
CO2: 24 mmol/L (ref 22–32)
Calcium: 9.2 mg/dL (ref 8.9–10.3)
Chloride: 107 mmol/L (ref 98–111)
Creatinine, Ser: 2.26 mg/dL — ABNORMAL HIGH (ref 0.61–1.24)
GFR, Estimated: 33 mL/min — ABNORMAL LOW (ref >60)
Glucose, Bld: 94 mg/dL (ref 70–99)
Potassium: 4.3 mmol/L (ref 3.5–5.1)
Sodium: 140 mmol/L (ref 135–145)

## 2022-02-03 MED ORDER — TRELEGY ELLIPTA 200-62.5-25 MCG/ACT IN AEPB
1.0000 | INHALATION_SPRAY | Freq: Every day | RESPIRATORY_TRACT | 6 refills | Status: DC
  Filled 2022-02-03: qty 60, 30d supply, fill #0

## 2022-02-03 MED ORDER — IPRATROPIUM-ALBUTEROL 0.5-2.5 (3) MG/3ML IN SOLN
3.0000 mL | RESPIRATORY_TRACT | 6 refills | Status: DC | PRN
  Filled 2022-02-03: qty 360, 20d supply, fill #0
  Filled 2022-02-12: qty 360, 20d supply, fill #1

## 2022-02-03 NOTE — Progress Notes (Signed)
Synopsis: Referred in for shortness of breath by [name], MD  Assessment & Plan:   #Shortness of Breath #COPD  Chris [name] is a pleasant 61 year old male patient with a significant smoking history presenting with shortness of breath, cough, and wheeze suggestive of COPD.  He has been maintained on Symbicort which I will adjust and switch him to triple therapy with Trelegy (200-62.5-25 mcg) in addition to DuoNebs every 6 hours as needed and albuterol inhaler as needed as well.  I will order pulmonary function testing (perimetry, lung volumes, DLCO) to evaluate his lung function.  I have reviewed his previous imaging in our record, including a CT scan of the chest from November 2022.  This scan does not show any signs of emphysema and rather suggests some airway thickening; for this reason I will not be sending alpha-1 levels.  Given this, I suspect he has more of a chronic bronchitis type picture versus a COPD/asthma overlap. The latter is supported by an eosinophil value of 700 in May 2022. He will benefit from a higher dose ICS in his Trelegy and hence the 200 mcg dose.  Finally, I counseled Chris [name] extensively about the need to quit smoking and limit his inhalational exposure.  This is going to be of utmost importance to control his symptoms.  -PFT's -Switch Symbicort to Trelegy (200-62.5-25) 1 puff daily -Switch albuterol neb to duo-neb  #Tobacco Dependence  I counseled Chris [name] extensively about the need and importance of stopping smoking.  I also discussed with him his marijuana smoking.  I discussed with him at length the dangers of inhalational exposure to the lung and damage that it could precipitate.  I recommended abstinence from smoking (cigarettes, THC) and suggested edibles as an alternative with harm reduction in mind.   Return in about 2 months (around 04/05/2022).  I spent 60 minutes caring for this patient today, including preparing to see the patient,  obtaining and/or reviewing separately obtained history, performing a medically appropriate examination and/or evaluation, counseling and educating the patient/family/caregiver, ordering medications, tests, or procedures, and documenting clinical information in the electronic health record  Armando Reichert, MD [facility] 02/03/2022 10:49 AM    End of visit medications:  Current Outpatient Medications:    Accu-Chek Softclix Lancets lancets, Use as instructed, Disp: 100 each, Rfl: 12   albuterol (VENTOLIN HFA) 108 (90 Base) MCG/ACT inhaler, Inhale 2 puffs into the lungs every 6 (six) hours as needed for wheezing or shortness of breath., Disp: 18 g, Rfl: 12   allopurinol (ZYLOPRIM) 100 MG tablet, TAKE 2 TABLETS (200 MG TOTAL) BY MOUTH DAILY., Disp: 180 tablet, Rfl: 1   aspirin 81 MG EC tablet, Take 1 tablet (81 mg total) by mouth daily., Disp: 100 tablet, Rfl: 2   atorvastatin (LIPITOR) 40 MG tablet, Take 1 tablet (40 mg total) by mouth daily., Disp: 90 tablet, Rfl: 3   Blood Glucose Monitoring Suppl (ACCU-CHEK GUIDE) w/Device KIT, Use as directed, Disp: 1 kit, Rfl: 0   carvedilol (COREG) 6.25 MG tablet, Take 1 tablet (6.25 mg total) by mouth 2 (two) times daily., Disp: 180 tablet, Rfl: 3   colchicine 0.6 MG tablet, Take 2 tabs (1.2 mg) at the onset of a gout flare, may repeat 1 tab (0.6 mg) after 2 hours if symptoms persist., Disp: 30 tablet, Rfl: 1   dapagliflozin propanediol (FARXIGA) 10 MG TABS tablet, Take 1 tablet (10 mg total) by mouth daily., Disp: 90 tablet, Rfl: 3   Fluticasone-Umeclidin-Vilant (  TRELEGY ELLIPTA) 200-62.5-25 MCG/ACT AEPB, Inhale 1 puff into the lungs daily., Disp: 60 each, Rfl: 6   glipiZIDE (GLUCOTROL) 5 MG tablet, Take 0.5 tablets (2.5 mg total) by mouth daily before breakfast., Disp: 45 tablet, Rfl: 3   glucose blood (ACCU-CHEK GUIDE) test strip, Use as directed to check blood sugar 1-2 times a day, Disp: 100 each, Rfl: 12   ipratropium-albuterol  (DUONEB) 0.5-2.5 (3) MG/3ML SOLN, Take 3 mLs by nebulization every 4 (four) hours as needed., Disp: 360 mL, Rfl: 6   isosorbide-hydrALAZINE (BIDIL) 20-37.5 MG tablet, Take 1 tablet by mouth 3 (three) times daily., Disp: 90 tablet, Rfl: 6   ketorolac (ACULAR) 0.5 % ophthalmic solution, Place 1 drop into the right eye 4 (four) times daily., Disp: 5 mL, Rfl: 0   montelukast (SINGULAIR) 10 MG tablet, TAKE 1 TABLET (10 MG TOTAL) BY MOUTH AT BEDTIME., Disp: 30 tablet, Rfl: 2   polyethylene glycol powder (GLYCOLAX/MIRALAX) 17 GM/SCOOP powder, Take 17 g by mouth daily as needed for mild constipation., Disp: 3350 g, Rfl: 1   predniSONE (DELTASONE) 20 MG tablet, Take 2 tablets (40 mg total) by mouth daily., Disp: 8 tablet, Rfl: 0   torsemide (DEMADEX) 20 MG tablet, Take 2 tablets (40 mg total) by mouth daily. (Patient not taking: Reported on 01/29/2022), Disp: 60 tablet, Rfl: 3   Subjective:   PATIENT ID: Chris [name] GENDER: male DOB: 1961-03-23, MRN: [id number]  Chief Complaint  Patient presents with   PULMONARY CONSULT    SOB with exertion, prod cough with clear to white sputum and wheezing. Sx have been present for years.     HPI  Chris [name] is a pleasant 61 year old male presenting to clinic with the chief complaint of shortness of breath and cough.  He reports that his symptoms started around 3 to 4 years ago. He noticed that he was getting short of breath while exercising and had to take breaks in between repetitions.  His shortness of breath has progressed and he currently feels that he is limited secondary to it.  He has a cough that is productive of whitish sputum that is a daily occurrence.  He reports wheezing and chest tightness and does not feel significant relief from his inhalers. There has been no change in the quality or quantity of the expectorated sputum. He does not have any fevers or chills, nor does he endorse any sick contacts.  He is followed by Dr. Garen Lah for heart  failure and has been optimized volume wise.  He has been maintained on Symbicort as well as albuterol nebulizers and albuterol inhalers.  He reports compliance with his regimen but does not feel they are helping.  He is a lifelong smoker and had a some point in his life smoked 2 packs a day.  He currently reports that he smokes 1 pack a week.  He likely has more than 45 pack years of smoking history. He also reports currently smoking marijuana (smokes flower, rolls into a joint, does not use filters).  He denies any other illicit drug use at the time but does have a history of smoking crack cocaine.  He does not endorse any history of lung disease growing up and denies any history of asthma.  He was athletic in his teens and 9s.  He does not have any family history of lung diseases.  He tells me that he was previously incarcerated and had been released on parole on 3 years ago.  He does  not have any pets, denies having any birds, and reports some mildew in the bathroom of his house but no other mold damage.  Ancillary information including prior medications, full medical/surgical/family/social histories, and PFTs (when available) are listed below and have been reviewed.   Review of Systems  Constitutional:  Negative for chills, fever and weight loss.  Respiratory:  Positive for cough, sputum production, shortness of breath and wheezing.   Cardiovascular:  Negative for chest pain and leg swelling.     Objective:   Vitals:   02/03/22 1015  BP: 138/84  Pulse: 75  Temp: 97.9 F (36.6 C)  TempSrc: Temporal  SpO2: 97%  Weight: 259 lb (117.5 kg)  Height: 6' (1.829 m)   97% on RA BMI Readings from Last 3 Encounters:  02/03/22 35.13 kg/m  01/29/22 35.89 kg/m  01/04/22 34.90 kg/m   Wt Readings from Last 3 Encounters:  02/03/22 259 lb (117.5 kg)  01/29/22 264 lb 9.6 oz (120 kg)  01/04/22 264 lb 8.8 oz (120 kg)    Physical Exam Constitutional:      Appearance: He is obese.  HENT:      Head: Normocephalic.     Nose: Nose normal.     Mouth/Throat:     Mouth: Mucous membranes are moist.  Eyes:     Pupils: Pupils are equal, round, and reactive to light.  Cardiovascular:     Rate and Rhythm: Normal rate and regular rhythm.     Heart sounds: Normal heart sounds.  Pulmonary:     Breath sounds: Wheezing, rhonchi and rales present.  Abdominal:     General: There is distension.  Musculoskeletal:     Cervical back: Normal range of motion and neck supple.     Right lower leg: No edema.     Left lower leg: No edema.  Neurological:     Mental Status: He is alert.       Ancillary Information    Past Medical History:  Diagnosis Date   Arrhythmia    atrial flutter   CHF (congestive heart failure) (HCC)    Chronic kidney disease    COPD (chronic obstructive pulmonary disease) (HCC)    Coronary artery disease    Depression    Diabetes mellitus without complication (HCC)    GERD (gastroesophageal reflux disease)    Gout    Hypertension    Influenza A with respiratory manifestations    Mental disorder      Family History  Problem Relation Age of Onset   Heart disease Father    Diabetes Mother    HIV Brother    Healthy Son    Healthy Daughter      Past Surgical History:  Procedure Laterality Date   ANKLE SURGERY     CARDIAC CATHETERIZATION     CARDIOVERSION N/A 07/08/2020   Procedure: CARDIOVERSION;  Surgeon: Corey Skains, MD;  Location: [facility];  Service: Cardiovascular;  Laterality: N/A;   COLONOSCOPY WITH PROPOFOL N/A 11/19/2021   Procedure: COLONOSCOPY WITH PROPOFOL;  Surgeon: Daryel November, MD;  Location: [facility];  Service: Gastroenterology;  Laterality: N/A;   HERNIA REPAIR     x2   POLYPECTOMY  11/19/2021   Procedure: POLYPECTOMY;  Surgeon: Daryel November, MD;  Location: [facility];  Service: Gastroenterology;;   SHOULDER SURGERY     TEE WITHOUT CARDIOVERSION N/A 07/08/2020   Procedure: TRANSESOPHAGEAL ECHOCARDIOGRAM (TEE);   Surgeon: Corey Skains, MD;  Location: [facility];  Service: Cardiovascular;  Laterality: N/A;    Social History   Socioeconomic History   Marital status: Divorced    Spouse name: Not on file   Number of children: 3   Years of education: Not on file   Highest education level: High school graduate  Occupational History   Occupation: disability  Tobacco Use   Smoking status: Every Day    Packs/day: 1.00    Years: 43.00    Total pack years: 43.00    Types: Cigarettes   Smokeless tobacco: Never   Tobacco comments:    1.5 pack per week-02/03/2022  Vaping Use   Vaping Use: Never used  Substance and Sexual Activity   Alcohol use: No   Drug use: Yes    Frequency: 21.0 times per week    Types: Marijuana, Cocaine    Comment: last use Cocaine- 03/28/2021. Still using marijuana   Sexual activity: Not on file  Other Topics Concern   Not on file  Social History Narrative   ** Merged History Encounter **       Social Determinants of Health   Financial Resource Strain: Medium Risk (06/19/2021)   Overall Financial Resource Strain (CARDIA)    Difficulty of Paying Living Expenses: Somewhat hard  Food Insecurity: No Food Insecurity (04/14/2021)   Hunger Vital Sign    Worried About Running Out of Food in the Last Year: Never true    Ran Out of Food in the Last Year: Never true  Transportation Needs: Unmet Transportation Needs (10/13/2021)   PRAPARE - Hydrologist (Medical): Yes    Lack of Transportation (Non-Medical): Yes  Physical Activity: Insufficiently Active (11/16/2021)   Exercise Vital Sign    Days of Exercise per Week: 5 days    Minutes of Exercise per Session: 20 min  Stress: Not on file  Social Connections: Moderately Isolated (04/28/2021)   Social Connection and Isolation Panel [NHANES]    Frequency of Communication with Friends and Family: More than three times a week    Frequency of Social Gatherings with Friends and Family: Once a week     Attends Religious Services: More than 4 times per year    Active Member of Clubs or Organizations: No    Attends Archivist Meetings: Never    Marital Status: Divorced  Intimate Partner Violence: Not on file     No Known Allergies   CBC    Component Value Date/Time   WBC 10.4 01/04/2022 0845   RBC 4.84 01/04/2022 0845   HGB 14.6 01/04/2022 0845   HGB 15.4 11/07/2019 0918   HCT 45.0 01/04/2022 0845   HCT 46.2 11/07/2019 0918   PLT 211 01/04/2022 0845   PLT 245 11/07/2019 0918   MCV 93.0 01/04/2022 0845   MCV 90 11/07/2019 0918   MCV 92 10/21/2013 0435   MCH 30.2 01/04/2022 0845   MCHC 32.4 01/04/2022 0845   RDW 14.8 01/04/2022 0845   RDW 14.2 11/07/2019 0918   RDW 15.7 (H) 10/21/2013 0435   LYMPHSABS 2.9 02/09/2021 0830   LYMPHSABS 1.1 10/21/2013 0435   MONOABS 1.0 02/09/2021 0830   MONOABS 0.6 10/21/2013 0435   EOSABS 0.3 02/09/2021 0830   EOSABS 0.0 10/21/2013 0435   BASOSABS 0.0 02/09/2021 0830   BASOSABS 0.0 10/21/2013 0435    Pulmonary Functions Testing Results:     No data to display          Outpatient Medications Prior to Visit  Medication Sig Dispense Refill  Accu-Chek Softclix Lancets lancets Use as instructed 100 each 12   albuterol (VENTOLIN HFA) 108 (90 Base) MCG/ACT inhaler Inhale 2 puffs into the lungs every 6 (six) hours as needed for wheezing or shortness of breath. 18 g 12   allopurinol (ZYLOPRIM) 100 MG tablet TAKE 2 TABLETS (200 MG TOTAL) BY MOUTH DAILY. 180 tablet 1   aspirin 81 MG EC tablet Take 1 tablet (81 mg total) by mouth daily. 100 tablet 2   atorvastatin (LIPITOR) 40 MG tablet Take 1 tablet (40 mg total) by mouth daily. 90 tablet 3   Blood Glucose Monitoring Suppl (ACCU-CHEK GUIDE) w/Device KIT Use as directed 1 kit 0   carvedilol (COREG) 6.25 MG tablet Take 1 tablet (6.25 mg total) by mouth 2 (two) times daily. 180 tablet 3   colchicine 0.6 MG tablet Take 2 tabs (1.2 mg) at the onset of a gout flare, may repeat 1 tab (0.6  mg) after 2 hours if symptoms persist. 30 tablet 1   dapagliflozin propanediol (FARXIGA) 10 MG TABS tablet Take 1 tablet (10 mg total) by mouth daily. 90 tablet 3   glipiZIDE (GLUCOTROL) 5 MG tablet Take 0.5 tablets (2.5 mg total) by mouth daily before breakfast. 45 tablet 3   glucose blood (ACCU-CHEK GUIDE) test strip Use as directed to check blood sugar 1-2 times a day 100 each 12   isosorbide-hydrALAZINE (BIDIL) 20-37.5 MG tablet Take 1 tablet by mouth 3 (three) times daily. 90 tablet 6   ketorolac (ACULAR) 0.5 % ophthalmic solution Place 1 drop into the right eye 4 (four) times daily. 5 mL 0   montelukast (SINGULAIR) 10 MG tablet TAKE 1 TABLET (10 MG TOTAL) BY MOUTH AT BEDTIME. 30 tablet 2   polyethylene glycol powder (GLYCOLAX/MIRALAX) 17 GM/SCOOP powder Take 17 g by mouth daily as needed for mild constipation. 3350 g 1   predniSONE (DELTASONE) 20 MG tablet Take 2 tablets (40 mg total) by mouth daily. 8 tablet 0   albuterol (PROVENTIL) (2.5 MG/3ML) 0.083% nebulizer solution TAKE 3 MLS BY NEBULIZATION EVERY 6 (SIX) HOURS AS NEEDED FOR SHORTNESS OF BREATH. 90 mL 1   budesonide-formoterol (SYMBICORT) 160-4.5 MCG/ACT inhaler Inhale 2 puffs into the lungs 2 (two) times daily. 10.2 g 12   torsemide (DEMADEX) 20 MG tablet Take 2 tablets (40 mg total) by mouth daily. (Patient not taking: Reported on 01/29/2022) 60 tablet 3   No facility-administered medications prior to visit.

## 2022-02-03 NOTE — Telephone Encounter (Signed)
Copied from [place] [id number]. Topic: General - Inquiry >> Feb 03, 2022 12:49 PM Marcellus Scott wrote: Reason for CRM: Pt stated needs letter for housing. They need to know what his medical diagnosis are and that he isn't in the greatest health. Mentioned he is on oxygen machine, nebulizer.   Pt mentioned he has congestive heart failure, kidney disease and lung disease.  Pt stated needs letter as soon as possible.   Pt is requesting a call back today.

## 2022-02-03 NOTE — Patient Instructions (Signed)
Today, we discussed your COPD and changes to your inhaler regimen. We discussed full smoking cessation and harm reduction in re [facility] use. I have also ordered a breathing test for you (PFTs)  Your new inhaler regimen will be as follows:  -Trelegy Ellipta, take 1 puff once daily. Wash your mouth after use -Albuterol inhaler, use every 4 hours as needed -duo-neb nebulizers, use every 6 hours as needed for shortness of breath  Please call our office if you have any change in your symptoms, breathing, or sputum.

## 2022-02-04 ENCOUNTER — Other Ambulatory Visit (HOSPITAL_COMMUNITY): Payer: Self-pay

## 2022-02-05 ENCOUNTER — Other Ambulatory Visit: Payer: Self-pay

## 2022-02-05 ENCOUNTER — Telehealth: Payer: Self-pay | Admitting: Cardiology

## 2022-02-05 MED ORDER — TORSEMIDE 20 MG PO TABS
20.0000 mg | ORAL_TABLET | Freq: Every day | ORAL | 1 refills | Status: DC
  Filled 2022-02-05 – 2022-02-12 (×2): qty 90, 90d supply, fill #0

## 2022-02-05 NOTE — Telephone Encounter (Signed)
Attempted to call the patient. No answer- his voice mail is currently full.   Will attempt to reach the patient at a later time.

## 2022-02-05 NOTE — Telephone Encounter (Signed)
Chris Sable, MD  02/04/2022  4:19 PM EDT     Creatinine slightly worse from prior, reduce torsemide to 20 mg daily.

## 2022-02-05 NOTE — Telephone Encounter (Signed)
I spoke with the patient. I have advised him of his recent BMP results and Dr. Thereasa Solo recommendations to: Decrease torsemide 20 mg to 1 tablet once daily   The patient voices understanding and is agreeable. He states he actually ran out of his torsemide RX, so he has not taken this for 2 days. I have advised him I will refill the RX today for the new dose, but probably ok that he gave his kidneys a rest x 2 days.   He will pick up his refill today.  The patient was appreciative of the call.

## 2022-02-08 ENCOUNTER — Other Ambulatory Visit (HOSPITAL_COMMUNITY): Payer: Self-pay

## 2022-02-08 ENCOUNTER — Other Ambulatory Visit: Payer: Self-pay

## 2022-02-09 ENCOUNTER — Telehealth: Payer: Self-pay | Admitting: Student in an Organized Health Care Education/Training Program

## 2022-02-09 ENCOUNTER — Other Ambulatory Visit: Payer: Self-pay

## 2022-02-09 ENCOUNTER — Other Ambulatory Visit (HOSPITAL_COMMUNITY): Payer: Self-pay

## 2022-02-09 DIAGNOSIS — J449 Chronic obstructive pulmonary disease, unspecified: Secondary | ICD-10-CM

## 2022-02-09 MED ORDER — BUDESONIDE-FORMOTEROL FUMARATE 160-4.5 MCG/ACT IN AERO
2.0000 | INHALATION_SPRAY | Freq: Two times a day (BID) | RESPIRATORY_TRACT | 12 refills | Status: DC
  Filled 2022-02-09: qty 10.2, 30d supply, fill #0

## 2022-02-09 MED ORDER — SPIRIVA RESPIMAT 2.5 MCG/ACT IN AERS
2.0000 | INHALATION_SPRAY | Freq: Every day | RESPIRATORY_TRACT | 11 refills | Status: DC
  Filled 2022-02-09: qty 4, 30d supply, fill #0

## 2022-02-09 NOTE — Telephone Encounter (Signed)
Patient is aware of below message/recommendations and voiced his understanding.  Nothing further needed.  

## 2022-02-09 NOTE — Telephone Encounter (Signed)
Received message from pharmacy. Trelegy not covered by insurance. He will continue with the Symbicort (two puffs twice daily) and we will add Spiriva Respimat (two puffs once daily) to achieve triple therapy. Orders sent to pharmacy

## 2022-02-10 ENCOUNTER — Other Ambulatory Visit: Payer: Self-pay

## 2022-02-10 NOTE — Telephone Encounter (Signed)
Okay for letter? Please advise.-----DD,RMA

## 2022-02-12 ENCOUNTER — Other Ambulatory Visit: Payer: Self-pay | Admitting: Family

## 2022-02-12 ENCOUNTER — Other Ambulatory Visit: Payer: Self-pay | Admitting: Internal Medicine

## 2022-02-12 ENCOUNTER — Other Ambulatory Visit: Payer: Self-pay

## 2022-02-12 DIAGNOSIS — E1169 Type 2 diabetes mellitus with other specified complication: Secondary | ICD-10-CM

## 2022-02-12 DIAGNOSIS — M109 Gout, unspecified: Secondary | ICD-10-CM

## 2022-02-12 MED ORDER — ATORVASTATIN CALCIUM 40 MG PO TABS
40.0000 mg | ORAL_TABLET | Freq: Every day | ORAL | 2 refills | Status: DC
  Filled 2022-02-12: qty 30, 30d supply, fill #0

## 2022-02-12 MED ORDER — COLCHICINE 0.6 MG PO TABS
ORAL_TABLET | ORAL | 1 refills | Status: DC
  Filled 2022-02-12: qty 30, 30d supply, fill #0

## 2022-02-12 NOTE — Telephone Encounter (Signed)
Unable to LVM due to vm being full.----DD,RMA

## 2022-02-16 ENCOUNTER — Other Ambulatory Visit: Payer: Self-pay | Admitting: *Deleted

## 2022-02-16 ENCOUNTER — Other Ambulatory Visit: Payer: Self-pay

## 2022-02-16 NOTE — Telephone Encounter (Signed)
Pt picked up letter.

## 2022-02-16 NOTE — Patient Outreach (Signed)
Care Coordination  02/16/2022  Chris [name] 03-04-1961 [id number]   Chris [name] was referred to the [facility] Managed Care High Risk team for assistance with care coordination and care management services. Care coordination/care management services as part of the Medicaid benefit was offered to the patient today. The patient declined assistance offered today.   Plan: The Medicaid Managed Care High Risk team is available at any time in the future to assist with care coordination/care management services upon referral.   [name] RN, BSN [place] RN Care Coordinator

## 2022-02-19 ENCOUNTER — Other Ambulatory Visit: Payer: Self-pay

## 2022-02-24 DIAGNOSIS — F341 Dysthymic disorder: Secondary | ICD-10-CM | POA: Diagnosis not present

## 2022-03-03 DIAGNOSIS — F341 Dysthymic disorder: Secondary | ICD-10-CM | POA: Diagnosis not present

## 2022-03-10 DIAGNOSIS — F341 Dysthymic disorder: Secondary | ICD-10-CM | POA: Diagnosis not present

## 2022-03-22 ENCOUNTER — Emergency Department
Admission: EM | Admit: 2022-03-22 | Discharge: 2022-03-22 | Disposition: A | Discharge status: Home / Self Care | Payer: Medicaid Other

## 2022-03-24 DIAGNOSIS — F341 Dysthymic disorder: Secondary | ICD-10-CM | POA: Diagnosis not present

## 2022-04-01 ENCOUNTER — Ambulatory Visit: Payer: Medicaid Other | Admitting: Internal Medicine

## 2022-04-08 ENCOUNTER — Other Ambulatory Visit: Payer: Self-pay

## 2022-04-16 ENCOUNTER — Ambulatory Visit: Payer: Medicaid Other | Admitting: Student in an Organized Health Care Education/Training Program

## 2022-04-19 ENCOUNTER — Emergency Department: Payer: Medicaid Other

## 2022-04-19 ENCOUNTER — Inpatient Hospital Stay
Admission: EM | Admit: 2022-04-19 | Discharge: 2022-04-26 | DRG: 291 | Disposition: A | Discharge status: Home / Self Care | Payer: Medicaid Other | Attending: Internal Medicine | Admitting: Internal Medicine

## 2022-04-19 ENCOUNTER — Other Ambulatory Visit: Payer: Self-pay

## 2022-04-19 ENCOUNTER — Ambulatory Visit: Payer: Medicaid Other | Admitting: Student in an Organized Health Care Education/Training Program

## 2022-04-19 DIAGNOSIS — Z8249 Family history of ischemic heart disease and other diseases of the circulatory system: Secondary | ICD-10-CM

## 2022-04-19 DIAGNOSIS — E1169 Type 2 diabetes mellitus with other specified complication: Secondary | ICD-10-CM

## 2022-04-19 DIAGNOSIS — Z833 Family history of diabetes mellitus: Secondary | ICD-10-CM | POA: Diagnosis not present

## 2022-04-19 DIAGNOSIS — R451 Restlessness and agitation: Secondary | ICD-10-CM | POA: Diagnosis present

## 2022-04-19 DIAGNOSIS — N183 Chronic kidney disease, stage 3 unspecified: Secondary | ICD-10-CM

## 2022-04-19 DIAGNOSIS — R109 Unspecified abdominal pain: Secondary | ICD-10-CM | POA: Diagnosis not present

## 2022-04-19 DIAGNOSIS — I5042 Chronic combined systolic (congestive) and diastolic (congestive) heart failure: Secondary | ICD-10-CM

## 2022-04-19 DIAGNOSIS — Z7984 Long term (current) use of oral hypoglycemic drugs: Secondary | ICD-10-CM

## 2022-04-19 DIAGNOSIS — J9601 Acute respiratory failure with hypoxia: Secondary | ICD-10-CM | POA: Diagnosis not present

## 2022-04-19 DIAGNOSIS — I5033 Acute on chronic diastolic (congestive) heart failure: Secondary | ICD-10-CM | POA: Diagnosis not present

## 2022-04-19 DIAGNOSIS — F1410 Cocaine abuse, uncomplicated: Secondary | ICD-10-CM | POA: Diagnosis present

## 2022-04-19 DIAGNOSIS — J121 Respiratory syncytial virus pneumonia: Secondary | ICD-10-CM | POA: Diagnosis present

## 2022-04-19 DIAGNOSIS — Z6836 Body mass index (BMI) 36.0-36.9, adult: Secondary | ICD-10-CM | POA: Diagnosis not present

## 2022-04-19 DIAGNOSIS — N1832 Chronic kidney disease, stage 3b: Secondary | ICD-10-CM | POA: Diagnosis present

## 2022-04-19 DIAGNOSIS — F419 Anxiety disorder, unspecified: Secondary | ICD-10-CM | POA: Diagnosis present

## 2022-04-19 DIAGNOSIS — J449 Chronic obstructive pulmonary disease, unspecified: Secondary | ICD-10-CM

## 2022-04-19 DIAGNOSIS — F32A Depression, unspecified: Secondary | ICD-10-CM | POA: Diagnosis present

## 2022-04-19 DIAGNOSIS — R4587 Impulsiveness: Secondary | ICD-10-CM | POA: Diagnosis present

## 2022-04-19 DIAGNOSIS — J4481 Bronchiolitis obliterans and bronchiolitis obliterans syndrome: Secondary | ICD-10-CM | POA: Diagnosis present

## 2022-04-19 DIAGNOSIS — R197 Diarrhea, unspecified: Secondary | ICD-10-CM | POA: Diagnosis not present

## 2022-04-19 DIAGNOSIS — J441 Chronic obstructive pulmonary disease with (acute) exacerbation: Secondary | ICD-10-CM | POA: Diagnosis present

## 2022-04-19 DIAGNOSIS — I130 Hypertensive heart and chronic kidney disease with heart failure and stage 1 through stage 4 chronic kidney disease, or unspecified chronic kidney disease: Secondary | ICD-10-CM | POA: Diagnosis present

## 2022-04-19 DIAGNOSIS — E1122 Type 2 diabetes mellitus with diabetic chronic kidney disease: Secondary | ICD-10-CM | POA: Diagnosis not present

## 2022-04-19 DIAGNOSIS — E669 Obesity, unspecified: Secondary | ICD-10-CM | POA: Diagnosis present

## 2022-04-19 DIAGNOSIS — N2889 Other specified disorders of kidney and ureter: Secondary | ICD-10-CM | POA: Diagnosis present

## 2022-04-19 DIAGNOSIS — D72829 Elevated white blood cell count, unspecified: Secondary | ICD-10-CM | POA: Diagnosis not present

## 2022-04-19 DIAGNOSIS — M109 Gout, unspecified: Secondary | ICD-10-CM | POA: Diagnosis present

## 2022-04-19 DIAGNOSIS — E785 Hyperlipidemia, unspecified: Secondary | ICD-10-CM | POA: Diagnosis present

## 2022-04-19 DIAGNOSIS — J440 Chronic obstructive pulmonary disease with acute lower respiratory infection: Secondary | ICD-10-CM | POA: Diagnosis present

## 2022-04-19 DIAGNOSIS — Z1152 Encounter for screening for COVID-19: Secondary | ICD-10-CM

## 2022-04-19 DIAGNOSIS — Z79899 Other long term (current) drug therapy: Secondary | ICD-10-CM

## 2022-04-19 DIAGNOSIS — Z7982 Long term (current) use of aspirin: Secondary | ICD-10-CM

## 2022-04-19 DIAGNOSIS — J439 Emphysema, unspecified: Secondary | ICD-10-CM

## 2022-04-19 DIAGNOSIS — J9811 Atelectasis: Secondary | ICD-10-CM | POA: Diagnosis not present

## 2022-04-19 DIAGNOSIS — I2510 Atherosclerotic heart disease of native coronary artery without angina pectoris: Secondary | ICD-10-CM | POA: Diagnosis present

## 2022-04-19 DIAGNOSIS — Z7951 Long term (current) use of inhaled steroids: Secondary | ICD-10-CM

## 2022-04-19 DIAGNOSIS — R079 Chest pain, unspecified: Secondary | ICD-10-CM | POA: Diagnosis not present

## 2022-04-19 DIAGNOSIS — Z7952 Long term (current) use of systemic steroids: Secondary | ICD-10-CM

## 2022-04-19 DIAGNOSIS — I5043 Acute on chronic combined systolic (congestive) and diastolic (congestive) heart failure: Secondary | ICD-10-CM | POA: Diagnosis present

## 2022-04-19 DIAGNOSIS — F1423 Cocaine dependence with withdrawal: Secondary | ICD-10-CM | POA: Diagnosis present

## 2022-04-19 DIAGNOSIS — J811 Chronic pulmonary edema: Secondary | ICD-10-CM | POA: Diagnosis not present

## 2022-04-19 DIAGNOSIS — F17210 Nicotine dependence, cigarettes, uncomplicated: Secondary | ICD-10-CM | POA: Diagnosis present

## 2022-04-19 DIAGNOSIS — K5901 Slow transit constipation: Secondary | ICD-10-CM

## 2022-04-19 LAB — CBC WITH DIFFERENTIAL/PLATELET
Abs Immature Granulocytes: 0.08 10*3/uL — ABNORMAL HIGH (ref 0.00–0.07)
Basophils Absolute: 0.1 10*3/uL (ref 0.0–0.1)
Basophils Relative: 1 %
Eosinophils Absolute: 0.2 10*3/uL (ref 0.0–0.5)
Eosinophils Relative: 2 %
HCT: 45.7 % (ref 39.0–52.0)
Hemoglobin: 14.8 g/dL (ref 13.0–17.0)
Immature Granulocytes: 1 %
Lymphocytes Relative: 18 %
Lymphs Abs: 1.7 10*3/uL (ref 0.7–4.0)
MCH: 29.4 pg (ref 26.0–34.0)
MCHC: 32.4 g/dL (ref 30.0–36.0)
MCV: 90.7 fL (ref 80.0–100.0)
Monocytes Absolute: 1 10*3/uL (ref 0.1–1.0)
Monocytes Relative: 10 %
Neutro Abs: 6.5 10*3/uL (ref 1.7–7.7)
Neutrophils Relative %: 68 %
Platelets: 196 10*3/uL (ref 150–400)
RBC: 5.04 MIL/uL (ref 4.22–5.81)
RDW: 14.9 % (ref 11.5–15.5)
WBC: 9.5 10*3/uL (ref 4.0–10.5)
nRBC: 0 % (ref 0.0–0.2)

## 2022-04-19 LAB — COMPREHENSIVE METABOLIC PANEL
ALT: 21 U/L (ref 0–44)
AST: 29 U/L (ref 15–41)
Albumin: 4 g/dL (ref 3.5–5.0)
Alkaline Phosphatase: 78 U/L (ref 38–126)
Anion gap: 10 (ref 5–15)
BUN: 24 mg/dL — ABNORMAL HIGH (ref 6–20)
CO2: 25 mmol/L (ref 22–32)
Calcium: 9.4 mg/dL (ref 8.9–10.3)
Chloride: 105 mmol/L (ref 98–111)
Creatinine, Ser: 1.73 mg/dL — ABNORMAL HIGH (ref 0.61–1.24)
GFR, Estimated: 45 mL/min — ABNORMAL LOW (ref >60)
Glucose, Bld: 80 mg/dL (ref 70–99)
Potassium: 4.2 mmol/L (ref 3.5–5.1)
Sodium: 140 mmol/L (ref 135–145)
Total Bilirubin: 0.7 mg/dL (ref 0.3–1.2)
Total Protein: 8.1 g/dL (ref 6.5–8.1)

## 2022-04-19 LAB — TROPONIN I (HIGH SENSITIVITY)
Troponin I (High Sensitivity): 54 ng/L — ABNORMAL HIGH (ref <18)
Troponin I (High Sensitivity): 64 ng/L — ABNORMAL HIGH (ref <18)

## 2022-04-19 LAB — RESP PANEL BY RT-PCR (FLU A&B, COVID) ARPGX2
Influenza A by PCR: NEGATIVE
Influenza B by PCR: NEGATIVE
SARS Coronavirus 2 by RT PCR: NEGATIVE

## 2022-04-19 LAB — BRAIN NATRIURETIC PEPTIDE: B Natriuretic Peptide: 547.8 pg/mL — ABNORMAL HIGH (ref 0.0–100.0)

## 2022-04-19 MED ORDER — ACETAMINOPHEN 650 MG RE SUPP: 650.0000 mg | Freq: Four times a day (QID) | RECTAL | Status: DC | PRN

## 2022-04-19 MED ORDER — IPRATROPIUM-ALBUTEROL 0.5-2.5 (3) MG/3ML IN SOLN
3.0000 mL | Freq: Once | RESPIRATORY_TRACT | Status: AC
  Administered 2022-04-19: 3 mL via RESPIRATORY_TRACT
  Filled 2022-04-19: qty 3

## 2022-04-19 MED ORDER — FUROSEMIDE 10 MG/ML IJ SOLN
40.0000 mg | Freq: Once | INTRAMUSCULAR | Status: AC
  Administered 2022-04-19: 40 mg via INTRAVENOUS
  Filled 2022-04-19: qty 4

## 2022-04-19 MED ORDER — METHYLPREDNISOLONE SODIUM SUCC 40 MG IJ SOLR
40.0000 mg | Freq: Two times a day (BID) | INTRAMUSCULAR | Status: DC
  Administered 2022-04-20: 40 mg via INTRAVENOUS
  Filled 2022-04-19: qty 1

## 2022-04-19 MED ORDER — TRAZODONE HCL 50 MG PO TABS
25.0000 mg | ORAL_TABLET | Freq: Every evening | ORAL | Status: DC | PRN
  Administered 2022-04-20 – 2022-04-21 (×2): 25 mg via ORAL
  Filled 2022-04-19 (×2): qty 1

## 2022-04-19 MED ORDER — ONDANSETRON HCL 4 MG/2ML IJ SOLN: 4.0000 mg | Freq: Four times a day (QID) | INTRAMUSCULAR | Status: DC | PRN

## 2022-04-19 MED ORDER — MAGNESIUM HYDROXIDE 400 MG/5ML PO SUSP: 30.0000 mL | Freq: Every day | ORAL | Status: DC | PRN

## 2022-04-19 MED ORDER — ACETAMINOPHEN 325 MG PO TABS
650.0000 mg | ORAL_TABLET | Freq: Four times a day (QID) | ORAL | Status: DC | PRN
  Administered 2022-04-21: 650 mg via ORAL
  Filled 2022-04-19: qty 2

## 2022-04-19 MED ORDER — METHYLPREDNISOLONE SODIUM SUCC 125 MG IJ SOLR
125.0000 mg | Freq: Once | INTRAMUSCULAR | Status: AC
  Administered 2022-04-19: 125 mg via INTRAVENOUS
  Filled 2022-04-19: qty 2

## 2022-04-19 MED ORDER — ONDANSETRON HCL 4 MG PO TABS: 4.0000 mg | ORAL_TABLET | Freq: Four times a day (QID) | ORAL | Status: DC | PRN

## 2022-04-19 MED ORDER — SODIUM CHLORIDE 0.9 % IV BOLUS
500.0000 mL | Freq: Once | INTRAVENOUS | Status: AC
  Administered 2022-04-19: 500 mL via INTRAVENOUS

## 2022-04-19 MED ORDER — FUROSEMIDE 10 MG/ML IJ SOLN
40.0000 mg | Freq: Two times a day (BID) | INTRAMUSCULAR | Status: DC
  Administered 2022-04-20 – 2022-04-21 (×3): 40 mg via INTRAVENOUS
  Filled 2022-04-19 (×3): qty 4

## 2022-04-19 MED ORDER — SODIUM CHLORIDE 0.9 % IV SOLN
1.0000 g | INTRAVENOUS | Status: DC
  Administered 2022-04-19: 1 g via INTRAVENOUS
  Filled 2022-04-19: qty 10

## 2022-04-19 MED ORDER — ENOXAPARIN SODIUM 60 MG/0.6ML IJ SOSY
0.5000 mg/kg | PREFILLED_SYRINGE | INTRAMUSCULAR | Status: DC
  Administered 2022-04-19 – 2022-04-25 (×7): 60 mg via SUBCUTANEOUS
  Filled 2022-04-19 (×7): qty 0.6

## 2022-04-19 MED ORDER — PREDNISONE 20 MG PO TABS: 40.0000 mg | ORAL_TABLET | Freq: Every day | ORAL | Status: DC

## 2022-04-19 NOTE — Assessment & Plan Note (Addendum)
Continue allopurinol 

## 2022-04-19 NOTE — Assessment & Plan Note (Addendum)
-  Continue Wilder Glade

## 2022-04-19 NOTE — ED Notes (Signed)
Respiratory called to place pt back on BIPAP as per MD request

## 2022-04-19 NOTE — Assessment & Plan Note (Addendum)
Continue statin therapy.

## 2022-04-19 NOTE — H&P (Addendum)
[place]   PATIENT NAME: Chris [name]    MR#:  [id number]  DATE OF BIRTH:  1960-11-11  DATE OF ADMISSION:  04/19/2022  PRIMARY CARE PHYSICIAN: [name], MD   Patient is coming from: Home  REQUESTING/REFERRING PHYSICIAN: Ane Payment, MD  CHIEF COMPLAINT:   Chief Complaint  Patient presents with   Chest Pain   Shortness of Breath    HISTORY OF PRESENT ILLNESS:  KAZMIR OKI is a 61 y.o. male with medical history significant for CHF, COPD, coronary artery disease, depression, stage IIIb chronic kidney disease, type 2 diabetes mellitus, GERD, gout, hypertension and atrial flutter, who presented to the emergency room with acute onset of worsening shortness of breath over the last 4 days with associated cough as well as nausea and vomiting.  She admitted to associated chest pain as well as generalized weakness..  No fever or chills.  No diarrhea or abdominal pain.  No melena or bright red bleeding per rectum.  No bleeding diathesis.  She denies any dysuria, oliguria, urinary frequency or urgency or flank pain.  ED Course: Upon presentation to the emergency room, BP was 196/131 with respiratory rate of 26 and given tachypnea to 33 and respiratory distress he was placed on BiPAP with a pulse oximetry of 97% on 35% FiO2.  Labs revealed normal CBC.  BMP showed a creatinine of 1.73 with BUN of 24 compared to 43 and 2.26 on 02/03/2022.  BNP was 547.8 above previous levels in July.  High sensitive troponin was 54 and later 64. EKG as reviewed by me :   EKG showed normal sinus rhythm with fusion complexes, T wave inversion laterally, poor R wave progression and early repolarization in V3. Imaging: Two-view chest x-ray showed chronic cardiomegaly and chronic bronchial thickening with minor bibasilar atelectasis.  The patient was given 40 mg of IV Lasix, DuoNebs twice, 125 mg of IV Solu-Medrol and 500 mill IV normal saline.  He will be admitted to a progressive unit bed  for further evaluation and management. PAST MEDICAL HISTORY:   Past Medical History:  Diagnosis Date   Arrhythmia    atrial flutter   CHF (congestive heart failure) (HCC)    Chronic kidney disease    COPD (chronic obstructive pulmonary disease) ([place])    Coronary artery disease    Depression    Diabetes mellitus without complication ([name])    GERD (gastroesophageal reflux disease)    Gout    Hypertension    Influenza A with respiratory manifestations    Mental disorder     PAST SURGICAL HISTORY:   Past Surgical History:  Procedure Laterality Date   ANKLE SURGERY     CARDIAC CATHETERIZATION     CARDIOVERSION N/A 07/08/2020   Procedure: CARDIOVERSION;  Surgeon: Corey Skains, MD;  Location: [facility] ORS;  Service: Cardiovascular;  Laterality: N/A;   COLONOSCOPY WITH PROPOFOL N/A 11/19/2021   Procedure: COLONOSCOPY WITH PROPOFOL;  Surgeon: Daryel November, MD;  Location: [facility];  Service: Gastroenterology;  Laterality: N/A;   HERNIA REPAIR     x2   POLYPECTOMY  11/19/2021   Procedure: POLYPECTOMY;  Surgeon: Daryel November, MD;  Location: [facility];  Service: Gastroenterology;;   SHOULDER SURGERY     TEE WITHOUT CARDIOVERSION N/A 07/08/2020   Procedure: TRANSESOPHAGEAL ECHOCARDIOGRAM (TEE);  Surgeon: Corey Skains, MD;  Location: [facility];  Service: Cardiovascular;  Laterality: N/A;    SOCIAL HISTORY:   Social History   Tobacco  Use   Smoking status: Every Day    Packs/day: 1.00    Years: 43.00    Total pack years: 43.00    Types: Cigarettes   Smokeless tobacco: Never   Tobacco comments:    1.5 pack per week-02/03/2022  Substance Use Topics   Alcohol use: No    FAMILY HISTORY:   Family History  Problem Relation Age of Onset   Heart disease Father    Diabetes Mother    HIV Brother    Healthy Son    Healthy Daughter     DRUG ALLERGIES:  No Known Allergies  REVIEW OF SYSTEMS:   ROS As per history of present illness. All pertinent systems  were reviewed above. Constitutional, HEENT, cardiovascular, respiratory, GI, GU, musculoskeletal, neuro, psychiatric, endocrine, integumentary and hematologic systems were reviewed and are otherwise negative/unremarkable except for positive findings mentioned above in the HPI.   MEDICATIONS AT HOME:   Prior to Admission medications   Medication Sig Start Date End Date Taking? Authorizing Provider  Accu-Chek Softclix Lancets lancets Use as instructed 12/12/20   [name], MD  albuterol (VENTOLIN HFA) 108 (90 Base) MCG/ACT inhaler Inhale 2 puffs into the lungs every 6 (six) hours as needed for wheezing or shortness of breath. 05/21/21   [name], MD  allopurinol (ZYLOPRIM) 100 MG tablet TAKE 2 TABLETS (200 MG TOTAL) BY MOUTH DAILY. 12/30/21   [name], MD  aspirin 81 MG EC tablet Take 1 tablet (81 mg total) by mouth daily. 11/30/20   [name], MD  atorvastatin (LIPITOR) 40 MG tablet Take 1 tablet (40 mg total) by mouth daily. 02/12/22   [name], MD  Blood Glucose Monitoring Suppl (ACCU-CHEK GUIDE) w/Device KIT Use as directed 12/12/20   [name], MD  budesonide-formoterol [facility]) 160-4.5 MCG/ACT inhaler Inhale 2 puffs into the lungs 2 (two) times daily. 02/09/22   Armando Reichert, MD  carvedilol (COREG) 6.25 MG tablet Take 1 tablet (6.25 mg total) by mouth 2 (two) times daily. 12/01/20   Alisa Graff, FNP  colchicine 0.6 MG tablet Take 2 tabs (1.2 mg) at the onset of a gout flare, may repeat 1 tab (0.6 mg) after 2 hours if symptoms persist. 02/12/22   [name], MD  dapagliflozin propanediol (FARXIGA) 10 MG TABS tablet Take 1 tablet (10 mg total) by mouth daily. 12/01/20   Alisa Graff, FNP  glipiZIDE (GLUCOTROL) 5 MG tablet Take 0.5 tablets (2.5 mg total) by mouth daily before breakfast. 08/20/21   [name], MD  glucose blood (ACCU-CHEK GUIDE) test strip Use as directed to check blood sugar 1-2 times a day 12/12/20   [name], MD  ipratropium-albuterol (DUONEB) 0.5-2.5 (3) MG/3ML SOLN Take 3 mLs by nebulization every 4 (four) hours as needed. 02/03/22 02/03/23  Armando Reichert, MD  isosorbide-hydrALAZINE (BIDIL) 20-37.5 MG tablet Take 1 tablet by mouth 3 (three) times daily. 08/20/21   [name], MD  ketorolac (ACULAR) 0.5 % ophthalmic solution Place 1 drop into the right eye 4 (four) times daily. 11/22/21   Carrie Mew, MD  montelukast (SINGULAIR) 10 MG tablet TAKE 1 TABLET (10 MG TOTAL) BY MOUTH AT BEDTIME. 05/15/21 05/15/22  [name], MD  polyethylene glycol powder (GLYCOLAX/MIRALAX) 17 GM/SCOOP powder Take 17 g by mouth daily as needed for mild constipation. 12/21/21   [name], MD  predniSONE (DELTASONE) 20 MG tablet Take 2 tablets (40 mg total) by mouth daily.  10/29/21   [name], PA-C  Tiotropium Bromide Monohydrate (SPIRIVA RESPIMAT) 2.5 MCG/ACT AERS Inhale 2 puffs into the lungs daily. 02/09/22   Armando Reichert, MD  torsemide (DEMADEX) 20 MG tablet Take 1 tablet (20 mg total) by mouth daily. 02/05/22   [name], MD  metoprolol tartrate (LOPRESSOR) 100 MG tablet Take 1 tablet (100 mg total) by mouth 2 (two) times daily. 07/09/20 07/11/20  [name], MD      VITAL SIGNS:  Blood pressure (!) 177/98, pulse 96, temperature 98.5 F (36.9 C), temperature source Oral, resp. rate (!) 28, height 6' (1.829 m), weight 121.6 kg, SpO2 97 %.  PHYSICAL EXAMINATION:  Physical Exam  GENERAL: Acute ill 61 y.o.-year-old patient lying in the bed with moderate respiratory distress while briefly being off of BiPAP. EYES: Pupils equal, round, reactive to light and accommodation. No scleral icterus. Extraocular muscles intact.  HEENT: Head atraumatic, normocephalic. Oropharynx and nasopharynx clear.  NECK:  Supple, no jugular venous distention. No thyroid enlargement, no tenderness.  LUNGS: Diminished bibasilar breath sounds with bibasilar rales, with diffuse extremities is  intact expiratory airflow with harsh vesicular breathing. CARDIOVASCULAR: Regular rate and rhythm, S1, S2 normal. No murmurs, rubs, or gallops.  ABDOMEN: Soft, nondistended, nontender. Bowel sounds present. No organomegaly or mass.  EXTREMITIES: Trace bilateral lower extremity pitting edema, with no cyanosis, or clubbing.  NEUROLOGIC: Cranial nerves II through XII are intact. Muscle strength 5/5 in all extremities. Sensation intact. Gait not checked.  PSYCHIATRIC: The patient is alert and oriented x 3.  Normal affect and good eye contact. SKIN: No obvious rash, lesion, or ulcer.   LABORATORY PANEL:   CBC Recent Labs  Lab 04/19/22 1712  WBC 9.5  HGB 14.8  HCT 45.7  PLT 196   ------------------------------------------------------------------------------------------------------------------  Chemistries  Recent Labs  Lab 04/19/22 1605  NA 140  K 4.2  CL 105  CO2 25  GLUCOSE 80  BUN 24*  CREATININE 1.73*  CALCIUM 9.4  AST 29  ALT 21  ALKPHOS 78  BILITOT 0.7   ------------------------------------------------------------------------------------------------------------------  Cardiac Enzymes No results for input(s): "TROPONINI" in the last 168 hours. ------------------------------------------------------------------------------------------------------------------  RADIOLOGY:  DG Chest 2 View  Result Date: 04/19/2022 CLINICAL DATA:  Chest pain. EXAM: CHEST - 2 VIEW COMPARISON:  Most recent radiograph 01/04/2022 chest CT 04/27/2021 reviewed FINDINGS: Chronic cardiomegaly. Unchanged mediastinal contours with aortic atherosclerosis. Chronic bronchial thickening with minor bibasilar atelectasis. No confluent consolidation. No pleural effusion or pneumothorax. No features of pulmonary edema. No acute osseous abnormalities. IMPRESSION: Chronic cardiomegaly. Chronic bronchial thickening and minor bibasilar atelectasis. Electronically Signed   By: Keith Rake M.D.   On: 04/19/2022  16:22      IMPRESSION AND PLAN:  Assessment and Plan: * Acute respiratory failure with hypoxia ([place]) - The patient will be admitted to a progressive unit bed. - He is initially required BiPAP and will likely needed overnight. - O2 protocol be followed. - This is likely secondary to COPD acute exacerbation as well as acute on chronic CHF.  COPD exacerbation ([place]) - The patient be placed on bronchodilator therapy with DuoNebs and mucolytic therapy. - We will add IV antibiotic therapy with Rocephin. - IV steroids will be provided with Solu-Medrol. - We will continue Spiriva and hold off long-acting beta agonist in Symbicort.  Acute on chronic diastolic CHF (congestive heart failure) (HCC) - Last 2D echo on 01/26/2022 revealed an EF of 50% with grade 1 diastolic dysfunction and mild to moderate left atrial dilatation. -  The patient will be diuresed with IV Lasix. - We will follow serial troponins. - We will continue Farxiga, Coreg and BiDil.   Type 2 diabetes mellitus with stage 3 chronic kidney disease ([place]) - The patient will be placed on supplement coverage with NovoLog. - We will continue [place] and Glucotrol.  Gout - We will Continue allopurinol  Dyslipidemia - We will continue statin therapy.    DVT prophylaxis: Lovenox  Advanced Care Planning:  Code Status: full code.  Family Communication:  The plan of care was discussed in details with the patient (and family). I answered all questions. The patient agreed to proceed with the above mentioned plan. Further management will depend upon hospital course. Disposition Plan: Back to previous home environment Consults called: none.  All the records are reviewed and case discussed with ED provider.  Status is: Inpatient   At the time of the admission, it appears that the appropriate admission status for this patient is inpatient.  This is judged to be reasonable and necessary in order to provide the required intensity of  service to ensure the patient's safety given the presenting symptoms, physical exam findings and initial radiographic and laboratory data in the context of comorbid conditions.  The patient requires inpatient status due to high intensity of service, high risk of further deterioration and high frequency of surveillance required.  I certify that at the time of admission, it is my clinical judgment that the patient will require inpatient hospital care extending more than 2 midnights.                            Dispo: The patient is from: Home              Anticipated d/c is to: Home              Patient currently is not medically stable to d/c.              Difficult to place patient: No  Authorized and performed by: Eugenie Norrie, MD Total critical care time: Approximately  60     minutes. Due to a high probability of clinically significant, life-threatening deterioration, the patient required my highest level of preparedness to intervene emergently and I personally spent this critical care time directly and personally managing the patient.  This critical care time included obtaining a history, examining the patient, pulse oximetry, ordering and review of studies, arranging urgent treatment with development of management plan, evaluation of patient's response to treatment, frequent reassessment, and discussions with other providers. This critical care time was performed to assess and manage the high probability of imminent, life-threatening deterioration that could result in multiorgan failure.  It was exclusive of separately billable procedures and treating other patients and teaching time.    Christel Mormon M.D on 04/19/2022 at 11:22 PM  [facility]   From 7 PM-7 AM, contact night-coverage [email or web address]  CC: Primary care physician; [name], MD

## 2022-04-19 NOTE — Assessment & Plan Note (Addendum)
-   Last 2D echo on 01/26/2022 revealed an EF of 50% with grade 1 diastolic dysfunction and mild to moderate left atrial dilatation. - We will continue Farxiga, Coreg and BiDil and can go back on torsemide as outpatient.  Patient was given IV diuresis with IV Lasix here in the hospital

## 2022-04-19 NOTE — Progress Notes (Signed)
PHARMACIST - PHYSICIAN COMMUNICATION  CONCERNING:  Enoxaparin (Lovenox) for DVT Prophylaxis    RECOMMENDATION: Patient was prescribed enoxaprin '30mg'$  q24 hours for VTE prophylaxis.   Filed Weights   04/19/22 1900  Weight: 121.6 kg (268 lb)    Body mass index is 36.35 kg/m.  Estimated Creatinine Clearance: 61.9 mL/min (A) (by C-G formula based on SCr of 1.73 mg/dL (H)).  Based on [place] patient is candidate for enoxaparin 0.'5mg'$ /kg TBW SQ every 24 hours based on BMI being >30 (BMI 36) and CrCL 57m/min.   DESCRIPTION: Pharm[facility] has [facility]  Patient is now receiving enoxaparin 60 mg every 24 hours   BLorna Dibble PharmD Clinical Pharmacist  04/19/2022 7:40 PM

## 2022-04-19 NOTE — Assessment & Plan Note (Addendum)
RSV infection.  Since the patient was on Solu-Medrol during the entire hospital course I will taper off Solu-Medrol as outpatient.   Refill patient's nebulizer solution and inhalers.

## 2022-04-19 NOTE — Assessment & Plan Note (Addendum)
The patient initially required BiPAP secondary to respiratory distress.  The patient was on oxygen most of the hospital course.  Patient able to come off oxygen prior to disposition and moving better air.

## 2022-04-19 NOTE — ED Provider Notes (Signed)
[facility] Provider Note    Event Date/Time   First MD Initiated Contact with Patient 04/19/22 1653     (approximate)   History   Chest Pain and Shortness of Breath   HPI  Chris [name] is a 61 y.o. male with a history of diabetes, hypertension, CHF, COPD, CKD, and GERD who presents with urgency breath for the last 4 days, onset, associated with cough as well as with some nausea and vomiting as well as chest pain.  The patient reports generalized weakness.  I reviewed the past medical history.  The patient's most recent outpatient encounter was with with [facility] on 8/23 for treatment of COPD.   Physical Exam   Triage Vital Signs: ED Triage Vitals [04/19/22 1600]  Enc Vitals Group     BP (!) 196/131     Pulse Rate 86     Resp (!) 26     Temp 98 F (36.7 C)     Temp Source Oral     SpO2 100 %     Weight      Height      Head Circumference      Peak Flow      Pain Score 10     Pain Loc      Pain Edu?      Excl. in [place]?     Most recent vital signs: Vitals:   04/19/22 2151 04/19/22 2228  BP: (!) 177/98   Pulse: 96 89  Resp: (!) 28 (!) 25  Temp: 98.5 F (36.9 C)   SpO2: 97% 99%     General: Alert and oriented, uncomfortable appearing. CV:  Good peripheral perfusion.  Resp:  Increased respiratory effort, tachypnea.  Coarse breath sounds and some rhonchi bilaterally. Abd:  Soft and nontender.  No distention.  Other:  No significant peripheral edema.   ED Results / Procedures / Treatments   Labs (all labs ordered are listed, but only abnormal results are displayed) Labs Reviewed  COMPREHENSIVE METABOLIC PANEL - Abnormal; Notable for the following components:      Result Value   BUN 24 (*)    Creatinine, Ser 1.73 (*)    GFR, Estimated 45 (*)    All other components within normal limits  CBC WITH DIFFERENTIAL/PLATELET - Abnormal; Notable for the following components:   Abs Immature Granulocytes 0.08 (*)    All other  components within normal limits  BRAIN NATRIURETIC PEPTIDE - Abnormal; Notable for the following components:   B Natriuretic Peptide 547.8 (*)    All other components within normal limits  TROPONIN I (HIGH SENSITIVITY) - Abnormal; Notable for the following components:   Troponin I (High Sensitivity) 54 (*)    All other components within normal limits  TROPONIN I (HIGH SENSITIVITY) - Abnormal; Notable for the following components:   Troponin I (High Sensitivity) 64 (*)    All other components within normal limits  RESP PANEL BY RT-PCR (FLU A&B, COVID) ARPGX2  CBC WITH DIFFERENTIAL/PLATELET  CBC  BASIC METABOLIC PANEL  HIV ANTIBODY (ROUTINE TESTING W REFLEX)     EKG  ED ECG REPORT I, Arta Silence, the attending physician, personally viewed and interpreted this ECG.  Date: 04/19/2022 EKG Time: 1600 Rate: 83 Rhythm: normal sinus rhythm QRS Axis: normal Intervals: normal ST/T Wave abnormalities: None specific T wave abnormalities Narrative Interpretation: Nonspecific abnormalities with no evidence of acute ischemia    RADIOLOGY  Chest x-ray: I independently viewed and interpreted the images;  there is bronchial thickening with no focal consolidation or edema   PROCEDURES:  Critical Care performed: Yes, see critical care procedure note(s)  .Critical Care  Performed by: Arta Silence, MD Authorized by: Arta Silence, MD   Critical care provider statement:    Critical care time (minutes):  30   Critical care was necessary to treat or prevent imminent or life-threatening deterioration of the following conditions:  Respiratory failure   Critical care was time spent personally by me on the following activities:  Development of treatment plan with patient or surrogate, discussions with consultants, evaluation of patient's response to treatment, examination of patient, ordering and review of laboratory studies, ordering and review of radiographic studies,  ordering and performing treatments and interventions, pulse oximetry, re-evaluation of patient's condition, review of old charts and obtaining history from patient or surrogate   Care discussed with: admitting provider      MEDICATIONS ORDERED IN ED: Medications  cefTRIAXone (ROCEPHIN) 1 g in sodium chloride 0.9 % 100 mL IVPB (0 g Intravenous Stopped 04/19/22 2116)  enoxaparin (LOVENOX) injection 60 mg (60 mg Subcutaneous Given 04/19/22 2117)  acetaminophen (TYLENOL) tablet 650 mg (has no administration in time range)    Or  acetaminophen (TYLENOL) suppository 650 mg (has no administration in time range)  magnesium hydroxide (MILK OF MAGNESIA) suspension 30 mL (has no administration in time range)  traZODone (DESYREL) tablet 25 mg (has no administration in time range)  ondansetron (ZOFRAN) tablet 4 mg (has no administration in time range)    Or  ondansetron (ZOFRAN) injection 4 mg (has no administration in time range)  methylPREDNISolone sodium succinate (SOLU-MEDROL) 40 mg/mL injection 40 mg (has no administration in time range)    Followed by  predniSONE (DELTASONE) tablet 40 mg (has no administration in time range)  furosemide (LASIX) injection 40 mg (has no administration in time range)  sodium chloride 0.9 % bolus 500 mL (0 mLs Intravenous Stopped 04/19/22 1755)  ipratropium-albuterol (DUONEB) 0.5-2.5 (3) MG/3ML nebulizer solution 3 mL (3 mLs Nebulization Given 04/19/22 1718)  methylPREDNISolone sodium succinate (SOLU-MEDROL) 125 mg/2 mL injection 125 mg (125 mg Intravenous Given 04/19/22 1720)  ipratropium-albuterol (DUONEB) 0.5-2.5 (3) MG/3ML nebulizer solution 3 mL (3 mLs Nebulization Given 04/19/22 1717)  furosemide (LASIX) injection 40 mg (40 mg Intravenous Given 04/19/22 2025)     IMPRESSION / MDM / [place] / ED COURSE  I reviewed the triage vital signs and the nursing notes.  61 year old male with PMH as noted above presents with shortness of breath for the last 4  days with some nausea and vomiting, generalized weakness, and chest discomfort.  On exam the patient has increased work of breathing and is speaking in short sentences.  He has coarse breath sounds and ronchi on lung exam and there is no peripheral edema.  The patient has an O2 saturation in the mid 90s on 2 L by nasal cannula.  Differential diagnosis includes, but is not limited to, COPD exacerbation, acute CHF, bronchitis, pneumonia, COVID-19, less likely ACS.  I do not suspect PE given the abnormal breath sounds.  Given the patient's work of breathing and tachypnea I have ordered BiPAP.  Chest x-ray shows bronchial thickening favoring COPD exacerbation.  I have ordered bronchodilators, steroid, and fluids.  We will obtain lab work-up and reassess.  Patient's presentation is most consistent with acute presentation with potential threat to life or bodily function.  The patient is on the cardiac monitor to evaluate for evidence of arrhythmia and/or significant  heart rate changes.  ----------------------------------------- 7:08 PM on 04/19/2022 -----------------------------------------  The patient is doing well on the BiPAP and appears much more comfortable although still tachypneic.  BNP is slightly elevated so I have ordered Lasix as well.  This appears to be mixed COPD and possible CHF.  I consulted Dr. Sidney Ace from the hospitalist service; based on our discussion he agrees to admit the patient.    FINAL CLINICAL IMPRESSION(S) / ED DIAGNOSES   Final diagnoses:  Acute respiratory failure with hypoxia ([place])     Rx / DC Orders   ED Discharge Orders     None        Note:  This document was prepared using Dragon voice recognition software and may include unintentional dictation errors.    Arta Silence, MD 04/19/22 2351

## 2022-04-19 NOTE — Progress Notes (Signed)
Patient placed back on BIPAP for increased WOB. Tolerating well.

## 2022-04-19 NOTE — ED Provider Triage Note (Signed)
  Emergency Medicine Provider Triage Evaluation Note  Chris [name] , a 61 y.o.male,  was evaluated in triage.  Pt complains of chest pain shortness of breath x3 days.  Patient states that he has the urge to throw up, but is unable to get anything out.  He states that the discomfort has progressively worsened.  Endorses a history of heart failure and lung disease.  Denies any recent sick contacts.   Review of Systems  Positive: Chest pain, shortness of breath, nausea Negative: Denies fever, abdominal pain, vomiting  Physical Exam  There were no vitals filed for this visit. Gen:   Awake, appears uncomfortable. Resp:  Normal effort  MSK:   Moves extremities without difficulty  Other:    Medical Decision Making  Given the patient's initial medical screening exam, the following diagnostic evaluation has been ordered. The patient will be placed in the appropriate treatment space, once one is available, to complete the evaluation and treatment. I have discussed the plan of care with the patient and I have advised the patient that an ED physician or mid-level practitioner will reevaluate their condition after the test results have been received, as the results may give them additional insight into the type of treatment they may need.    Diagnostics: Labs, EKG, CXR  Treatments: none immediately   Teodoro Spray, [place] 04/19/22 1557

## 2022-04-19 NOTE — ED Triage Notes (Addendum)
Pt to ED via POV from home. Pt reports CP and SOB x2-3 days. Pt reports pain and SOB have gotten worse. Pt grunting in triage and labored. Pt reports has not been able to get out of bed today and hasn't taken any home meds including BP meds. Pt placed on 2L [place] for comfort.

## 2022-04-20 DIAGNOSIS — J441 Chronic obstructive pulmonary disease with (acute) exacerbation: Secondary | ICD-10-CM | POA: Diagnosis not present

## 2022-04-20 DIAGNOSIS — I5033 Acute on chronic diastolic (congestive) heart failure: Secondary | ICD-10-CM | POA: Diagnosis not present

## 2022-04-20 DIAGNOSIS — J9601 Acute respiratory failure with hypoxia: Secondary | ICD-10-CM | POA: Diagnosis not present

## 2022-04-20 LAB — BASIC METABOLIC PANEL
Anion gap: 9 (ref 5–15)
BUN: 36 mg/dL — ABNORMAL HIGH (ref 6–20)
CO2: 22 mmol/L (ref 22–32)
Calcium: 9.6 mg/dL (ref 8.9–10.3)
Chloride: 107 mmol/L (ref 98–111)
Creatinine, Ser: 1.93 mg/dL — ABNORMAL HIGH (ref 0.61–1.24)
GFR, Estimated: 39 mL/min — ABNORMAL LOW (ref >60)
Glucose, Bld: 137 mg/dL — ABNORMAL HIGH (ref 70–99)
Potassium: 4.8 mmol/L (ref 3.5–5.1)
Sodium: 138 mmol/L (ref 135–145)

## 2022-04-20 LAB — EXPECTORATED SPUTUM ASSESSMENT W GRAM STAIN, RFLX TO RESP C: Special Requests: NORMAL

## 2022-04-20 LAB — CBC
HCT: 45.4 % (ref 39.0–52.0)
Hemoglobin: 15 g/dL (ref 13.0–17.0)
MCH: 29.5 pg (ref 26.0–34.0)
MCHC: 33 g/dL (ref 30.0–36.0)
MCV: 89.2 fL (ref 80.0–100.0)
Platelets: 211 10*3/uL (ref 150–400)
RBC: 5.09 MIL/uL (ref 4.22–5.81)
RDW: 15 % (ref 11.5–15.5)
WBC: 8.3 10*3/uL (ref 4.0–10.5)
nRBC: 0 % (ref 0.0–0.2)

## 2022-04-20 LAB — CBG MONITORING, ED
Glucose-Capillary: 157 mg/dL — ABNORMAL HIGH (ref 70–99)
Glucose-Capillary: 140 mg/dL — ABNORMAL HIGH (ref 70–99)

## 2022-04-20 LAB — URINE DRUG SCREEN, QUALITATIVE (ARMC ONLY)
Amphetamines, Ur Screen: NOT DETECTED
Barbiturates, Ur Screen: NOT DETECTED
Benzodiazepine, Ur Scrn: NOT DETECTED
Cannabinoid 50 Ng, Ur ~~LOC~~: NOT DETECTED
Cocaine Metabolite,Ur ~~LOC~~: POSITIVE — AB
MDMA (Ecstasy)Ur Screen: NOT DETECTED
Methadone Scn, Ur: NOT DETECTED
Opiate, Ur Screen: POSITIVE — AB
Phencyclidine (PCP) Ur S: NOT DETECTED
Tricyclic, Ur Screen: NOT DETECTED

## 2022-04-20 LAB — HIV ANTIBODY (ROUTINE TESTING W REFLEX): HIV Screen 4th Generation wRfx: NONREACTIVE

## 2022-04-20 LAB — HEMOGLOBIN A1C
Hgb A1c MFr Bld: 6.2 % — ABNORMAL HIGH (ref 4.8–5.6)
Mean Plasma Glucose: 131.24 mg/dL

## 2022-04-20 LAB — GLUCOSE, CAPILLARY
Glucose-Capillary: 116 mg/dL — ABNORMAL HIGH (ref 70–99)
Glucose-Capillary: 124 mg/dL — ABNORMAL HIGH (ref 70–99)

## 2022-04-20 MED ORDER — ASPIRIN 81 MG PO TBEC
81.0000 mg | DELAYED_RELEASE_TABLET | Freq: Every day | ORAL | Status: DC
  Administered 2022-04-20 – 2022-04-26 (×7): 81 mg via ORAL
  Filled 2022-04-20 (×7): qty 1

## 2022-04-20 MED ORDER — ALBUTEROL SULFATE (2.5 MG/3ML) 0.083% IN NEBU
2.5000 mg | INHALATION_SOLUTION | Freq: Four times a day (QID) | RESPIRATORY_TRACT | Status: DC | PRN
  Administered 2022-04-23: 2.5 mg via RESPIRATORY_TRACT
  Filled 2022-04-20: qty 3

## 2022-04-20 MED ORDER — METHYLPREDNISOLONE SODIUM SUCC 125 MG IJ SOLR
60.0000 mg | Freq: Two times a day (BID) | INTRAMUSCULAR | Status: DC
  Administered 2022-04-20 – 2022-04-23 (×6): 60 mg via INTRAVENOUS
  Filled 2022-04-20 (×6): qty 2

## 2022-04-20 MED ORDER — CARVEDILOL 6.25 MG PO TABS
6.2500 mg | ORAL_TABLET | Freq: Two times a day (BID) | ORAL | Status: DC
  Administered 2022-04-20 – 2022-04-26 (×13): 6.25 mg via ORAL
  Filled 2022-04-20 (×13): qty 1

## 2022-04-20 MED ORDER — MORPHINE SULFATE (PF) 2 MG/ML IV SOLN
1.0000 mg | INTRAVENOUS | Status: DC | PRN
  Administered 2022-04-20 – 2022-04-25 (×5): 1 mg via INTRAVENOUS
  Filled 2022-04-20 (×5): qty 1

## 2022-04-20 MED ORDER — GUAIFENESIN ER 600 MG PO TB12
1200.0000 mg | ORAL_TABLET | Freq: Two times a day (BID) | ORAL | Status: DC
  Administered 2022-04-20 – 2022-04-26 (×13): 1200 mg via ORAL
  Filled 2022-04-20 (×13): qty 2

## 2022-04-20 MED ORDER — INSULIN ASPART 100 UNIT/ML IJ SOLN
0.0000 [IU] | Freq: Three times a day (TID) | INTRAMUSCULAR | Status: DC
  Administered 2022-04-20 – 2022-04-23 (×2): 2 [IU] via SUBCUTANEOUS
  Administered 2022-04-23 – 2022-04-24 (×2): 1 [IU] via SUBCUTANEOUS
  Administered 2022-04-24 – 2022-04-26 (×2): 2 [IU] via SUBCUTANEOUS
  Filled 2022-04-20 (×5): qty 1

## 2022-04-20 MED ORDER — IPRATROPIUM-ALBUTEROL 0.5-2.5 (3) MG/3ML IN SOLN
3.0000 mL | RESPIRATORY_TRACT | Status: DC | PRN
  Administered 2022-04-20: 3 mL via RESPIRATORY_TRACT
  Filled 2022-04-20: qty 3

## 2022-04-20 MED ORDER — OXYCODONE-ACETAMINOPHEN 7.5-325 MG PO TABS
1.0000 | ORAL_TABLET | Freq: Four times a day (QID) | ORAL | Status: DC | PRN
  Administered 2022-04-21 – 2022-04-26 (×11): 1 via ORAL
  Filled 2022-04-20 (×11): qty 1

## 2022-04-20 MED ORDER — ISOSORB DINITRATE-HYDRALAZINE 20-37.5 MG PO TABS
1.0000 | ORAL_TABLET | Freq: Three times a day (TID) | ORAL | Status: DC
  Administered 2022-04-20 – 2022-04-26 (×18): 1 via ORAL
  Filled 2022-04-20 (×21): qty 1

## 2022-04-20 MED ORDER — INSULIN ASPART 100 UNIT/ML IJ SOLN
0.0000 [IU] | Freq: Every day | INTRAMUSCULAR | Status: DC
  Administered 2022-04-22: 2 [IU] via SUBCUTANEOUS
  Filled 2022-04-20 (×2): qty 1

## 2022-04-20 MED ORDER — SODIUM CHLORIDE 0.9 % IV SOLN
500.0000 mg | INTRAVENOUS | Status: DC
  Administered 2022-04-20 – 2022-04-25 (×6): 500 mg via INTRAVENOUS
  Filled 2022-04-20 (×2): qty 500
  Filled 2022-04-20 (×4): qty 5
  Filled 2022-04-20: qty 500

## 2022-04-20 MED ORDER — REVEFENACIN 175 MCG/3ML IN SOLN
175.0000 ug | Freq: Every day | RESPIRATORY_TRACT | Status: DC
  Administered 2022-04-21 – 2022-04-26 (×6): 175 ug via RESPIRATORY_TRACT
  Filled 2022-04-20 (×7): qty 3

## 2022-04-20 MED ORDER — BUDESONIDE 0.25 MG/2ML IN SUSP
0.2500 mg | Freq: Two times a day (BID) | RESPIRATORY_TRACT | Status: DC
  Administered 2022-04-20 – 2022-04-26 (×12): 0.25 mg via RESPIRATORY_TRACT
  Filled 2022-04-20 (×13): qty 2

## 2022-04-20 MED ORDER — ARFORMOTEROL TARTRATE 15 MCG/2ML IN NEBU
15.0000 ug | INHALATION_SOLUTION | Freq: Two times a day (BID) | RESPIRATORY_TRACT | Status: DC
  Administered 2022-04-20 – 2022-04-26 (×12): 15 ug via RESPIRATORY_TRACT
  Filled 2022-04-20 (×13): qty 2

## 2022-04-20 MED ORDER — METHYLPREDNISOLONE SODIUM SUCC 40 MG IJ SOLR: 40.0000 mg | Freq: Two times a day (BID) | INTRAMUSCULAR | Status: DC

## 2022-04-20 MED ORDER — ATORVASTATIN CALCIUM 20 MG PO TABS
40.0000 mg | ORAL_TABLET | Freq: Every day | ORAL | Status: DC
  Administered 2022-04-20 – 2022-04-26 (×7): 40 mg via ORAL
  Filled 2022-04-20 (×7): qty 2

## 2022-04-20 NOTE — Consult Note (Signed)
NAME:  Chris [name], MRN:  [id number], DOB:  11/25/1960, LOS: 1 ADMISSION DATE:  04/19/2022, CONSULTATION DATE: 04/20/2022 REFERRING MD: Chris [name] CHIEF COMPLAINT: COPD exacerbation  History of Present Illness:  Chris [name] is a 61 year old current smoker, with a history of COPD (FEV1 unknown), HFrEF, LVH and past history of atrial flutter who presented to [facility] on 19 April 2022 after a 4-day history of increasing shortness of breath and a dry nonproductive cough, orthopnea, generalized malaise and nausea and vomiting.  She also had associated chest pain and generalized weakness.  Did not have any fevers or chills but felt "off".  No lower extremity edema.  He is not certain if he has had sick contacts in the home.  He does note that though he feels congested in his chest he cannot expectorate.  ED/hospital course: Upon presentation in the emergency room patient was noted to be hypertensive tachypneic and in respiratory distress.  He was placed on BiPAP with improvement on his tachypnea and work of breathing.  BNP was 547.8, chest x-ray was unrevealing except for chronic cardiomegaly and chronic bronchial thickening, patient's troponins were mildly elevated.  He was treated with Lasix, DuoNebs and IV Solu-Medrol.  He was admitted to progressive care unit where he is currently at.  The patient has noted little improvement on his symptoms since admission.  Stated he felt that the BiPAP was "smothering him" but at the same time wants to keep using it at nighttime.  Patient was admitted with the impression of COPD exacerbation and possible decompensation of known congestive heart failure.  Patient was recently evaluated by Chris [name] at [facility] he was scheduled to have PFTs but these have not been done as of yet.  At his visit with Chris [name] the patient was placed on triple therapy for management of his COPD with LAMA/ICS/LAMA Symbicort/Spiriva).  PCCM has been consulted to  help with management of COPD exacerbation.  I have reviewed all available records and imaging.  Pertinent  Medical History  COPD, FEV1 unknown Diabetes mellitus HFrEF (combined systolic/diastolic) LVH Hypertension Tobacco dependence due to cigarettes  Significant Hospital Events: Including procedures, antibiotic start and stop dates in addition to other pertinent events   11/6: Admitted due to acute respiratory failure with hypoxia, COPD exacerbation versus decompensation of HFrEF 11/7: PCCM consulted for assistance with management of COPD exacerbation  Interim History / Subjective:  Patient states that he notes little improvement on his shortness of breath and overall symptoms since admission.  He does state however that his nausea and vomiting have improved.  Objective   Blood pressure (!) 135/92, pulse 87, temperature 97.8 F (36.6 C), resp. rate 19, height 6' (1.829 m), weight 121.6 kg, SpO2 97 %.    FiO2 (%):  [35 %] 35 %   Intake/Output Summary (Last 24 hours) at 04/20/2022 1630 Last data filed at 04/20/2022 1450 Gross per 24 hour  Intake 340.04 ml  Output 800 ml  Net -459.96 ml   Filed Weights   04/19/22 1900  Weight: 121.6 kg    Examination: GENERAL: Obese gentleman, sitting up in bed, no respiratory distress, no conversational dyspnea.  Comfortable with nasal cannula O2 in place. HEAD: Normocephalic, atraumatic.  EYES: Pupils equal, round, reactive to light.  No scleral icterus.  MOUTH: Oral mucosa moist.  No thrush. NECK: Supple. No thyromegaly. Trachea midline. No JVD.  No adenopathy. PULMONARY: Good air entry bilaterally.  There are scattered rhonchi and wheezes throughout.  No crackles. CARDIOVASCULAR:  S1 and S2. Regular rate and rhythm.  No rubs, murmurs or gallops heard. ABDOMEN: Obese, protuberant, otherwise benign. MUSCULOSKELETAL: No joint deformity, no clubbing, no edema.  NEUROLOGIC: No overt focal deficit.  Gait not tested.  Speech is fluent. SKIN:  Intact,warm,dry. PSYCH: Anxious, occasional pressured speech.   Imaging reviewed  Chest x-ray performed 19 April 2022 showing cardiomegaly with chronic bronchial thickening minor bibasilar atelectasis, independently reviewed:   Assessment & Plan:  Acute respiratory failure with hypoxia COPD exacerbation vs. 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 Management per hospitalist team Taper systemic steroids as quickly as able  History of cocaine/marijuana use Urine drug screen   Best Practice (right click and "Reselect all SmartList Selections" daily)   Diet/type: Regular consistency (see orders) DVT prophylaxis: LMWH GI prophylaxis: N/A Lines: N/A Foley:  N/A Code Status:  full code Last date of multidisciplinary goals of care discussion [N/A]  Labs   CBC: Recent Labs  Lab 04/19/22 1712 04/20/22 0607  WBC 9.5 8.3  NEUTROABS 6.5  --   HGB 14.8 15.0  HCT 45.7 45.4  MCV 90.7 89.2  PLT 196 716    Basic Metabolic Panel: Recent Labs  Lab 04/19/22 1605 04/20/22 0607  NA 140 138  K 4.2 4.8  CL 105 107  CO2 25 22  GLUCOSE 80 137*  BUN 24* 36*  CREATININE 1.73* 1.93*  CALCIUM 9.4 9.6   GFR: Estimated Creatinine Clearance: 55.5 mL/min (A) (by C-G formula based on SCr of 1.93 mg/dL (H)). Recent Labs  Lab 04/19/22 1712 04/20/22 0607  WBC 9.5 8.3    Liver Function Tests: Recent Labs  Lab 04/19/22 1605  AST 29  ALT 21  ALKPHOS 78  BILITOT 0.7  PROT 8.1  ALBUMIN 4.0   No results for input(s):  "LIPASE", "AMYLASE" in the last 168 hours. No results for input(s): "AMMONIA" in the last 168 hours.  ABG    Component Value Date/Time   PHART 7.635 (HH) 12/25/2019 0239   PCO2ART 19.3 (LL) 12/25/2019 0239   PO2ART 22 (LL) 12/25/2019 0239   HCO3 34.1 (H) 07/03/2020 2034   TCO2 27 10/28/2020 1117   ACIDBASEDEF 0.1 05/04/2020 1127   O2SAT 30.0 07/03/2020 2034     Coagulation Profile: No results for input(s): "INR", "PROTIME" in the last 168 hours.  Cardiac Enzymes: No results for input(s): "CKTOTAL", "CKMB", "CKMBINDEX", "TROPONINI" in the last 168 hours.  HbA1C: Hemoglobin A1C  Date/Time Value Ref Range Status  08/20/2021 10:08 AM 6.4 (A) 4.0 - 5.6 % Final   HbA1c, POC (prediabetic range)  Date/Time Value Ref Range Status  12/21/2021 11:41 AM 6.2 5.7 - 6.4 % Final   Hgb A1c MFr Bld  Date/Time Value Ref Range Status  04/20/2022 06:07 AM 6.2 (H) 4.8 - 5.6 % Final    Comment:    (NOTE) Pre diabetes:          5.7%-6.4%  Diabetes:              >6.4%  Glycemic control for   <7.0% adults with diabetes   02/09/2021 04:03 PM 6.9 (H) 4.8 - 5.6 % Final  Comment:    (NOTE) Pre diabetes:          5.7%-6.4%  Diabetes:              >6.4%  Glycemic control for   <7.0% adults with diabetes     CBG: Recent Labs  Lab 04/20/22 0943 04/20/22 1351  GLUCAP 157* 140*    Review of Systems:   A 10 point review of systems was performed and it is as noted above otherwise negative.  Past Medical History:  He,  has a past medical history of Arrhythmia, CHF (congestive heart failure) ([place]), Chronic kidney disease, COPD (chronic obstructive pulmonary disease) ([place]), Coronary artery disease, Depression, Diabetes mellitus without complication ([place]), GERD (gastroesophageal reflux disease), Gout, Hypertension, Influenza A with respiratory manifestations, and Mental disorder.   Surgical History:   Past Surgical History:  Procedure Laterality Date   ANKLE SURGERY     CARDIAC  CATHETERIZATION     CARDIOVERSION N/A 07/08/2020   Procedure: CARDIOVERSION;  Surgeon: Corey Skains, MD;  Location: [facility] ORS;  Service: Cardiovascular;  Laterality: N/A;   COLONOSCOPY WITH PROPOFOL N/A 11/19/2021   Procedure: COLONOSCOPY WITH PROPOFOL;  Surgeon: Daryel November, MD;  Location: [facility] ENDOSCOPY;  Service: Gastroenterology;  Laterality: N/A;   HERNIA REPAIR     x2   POLYPECTOMY  11/19/2021   Procedure: POLYPECTOMY;  Surgeon: Daryel November, MD;  Location: [facility] ENDOSCOPY;  Service: Gastroenterology;;   SHOULDER SURGERY     TEE WITHOUT CARDIOVERSION N/A 07/08/2020   Procedure: TRANSESOPHAGEAL ECHOCARDIOGRAM (TEE);  Surgeon: Corey Skains, MD;  Location: [facility] ORS;  Service: Cardiovascular;  Laterality: N/A;     Social History:   reports that he has been smoking cigarettes. He has a 43.00 pack-year smoking history. He has never used smokeless tobacco. He reports current drug use. Frequency: 21.00 times per week. Drugs: Marijuana and Cocaine. He reports that he does not drink alcohol.   Family History:  His family history includes Diabetes in his mother; HIV in his brother; Healthy in his daughter and son; Heart disease in his father.   Allergies No Known Allergies   Home Medications  Prior to Admission medications   Medication Sig Start Date End Date Taking? Authorizing Provider  allopurinol (ZYLOPRIM) 100 MG tablet TAKE 2 TABLETS (200 MG TOTAL) BY MOUTH DAILY. 12/30/21  Yes [name], MD  aspirin 81 MG EC tablet Take 1 tablet (81 mg total) by mouth daily. 11/30/20  Yes [name], MD  atorvastatin (LIPITOR) 40 MG tablet Take 1 tablet (40 mg total) by mouth daily. 02/12/22  Yes [name], MD  budesonide-formoterol [facility]) 160-4.5 MCG/ACT inhaler Inhale 2 puffs into the lungs 2 (two) times daily. 02/09/22  Yes Dgayli, Berdine Addison, MD  carvedilol (COREG) 6.25 MG tablet Take 1 tablet (6.25 mg total) by mouth 2 (two) times daily. 12/01/20  Yes [name], FNP  dapagliflozin propanediol (FARXIGA) 10 MG TABS tablet Take 1 tablet (10 mg total) by mouth daily. 12/01/20  Yes [name], FNP  glipiZIDE (GLUCOTROL) 5 MG tablet Take 0.5 tablets (2.5 mg total) by mouth daily before breakfast. 08/20/21  Yes [name], MD  isosorbide-hydrALAZINE (BIDIL) 20-37.5 MG tablet Take 1 tablet by mouth 3 (three) times daily. 08/20/21  Yes [name], MD  ketorolac (ACULAR) 0.5 % ophthalmic solution Place 1 drop into the right eye 4 (four) times daily. 11/22/21  Yes Carrie Mew, MD  montelukast (SINGULAIR) 10 MG tablet TAKE  1 TABLET (10 MG TOTAL) BY MOUTH AT BEDTIME. 05/15/21 05/15/22 Yes [name], MD  Tiotropium Bromide Monohydrate (SPIRIVA RESPIMAT) 2.5 MCG/ACT AERS Inhale 2 puffs into the lungs daily. 02/09/22  Yes Dgayli, Berdine Addison, MD  torsemide (DEMADEX) 20 MG tablet Take 1 tablet (20 mg total) by mouth daily. 02/05/22  Yes [name], MD  Accu-Chek Softclix Lancets lancets Use as instructed 12/12/20   [name], MD  albuterol (VENTOLIN HFA) 108 (90 Base) MCG/ACT inhaler Inhale 2 puffs into the lungs every 6 (six) hours as needed for wheezing or shortness of breath. 05/21/21   [name], MD  Blood Glucose Monitoring Suppl (ACCU-CHEK GUIDE) w/Device KIT Use as directed 12/12/20   [name], MD  colchicine 0.6 MG tablet Take 2 tabs (1.2 mg) at the onset of a gout flare, may repeat 1 tab (0.6 mg) after 2 hours if symptoms persist. 02/12/22   [name], MD  glucose blood (ACCU-CHEK GUIDE) test strip Use as directed to check blood sugar 1-2 times a day 12/12/20   [name], MD  ipratropium-albuterol (DUONEB) 0.5-2.5 (3) MG/3ML SOLN Take 3 mLs by nebulization every 4 (four) hours as needed. 02/03/22 02/03/23  Armando Reichert, MD  polyethylene glycol powder (GLYCOLAX/MIRALAX) 17 GM/SCOOP powder Take 17 g by mouth daily as needed for mild constipation. 12/21/21   [name], MD  predniSONE  (DELTASONE) 20 MG tablet Take 2 tablets (40 mg total) by mouth daily. Patient not taking: Reported on 04/19/2022 10/29/21   [name], PA-C  metoprolol tartrate (LOPRESSOR) 100 MG tablet Take 1 tablet (100 mg total) by mouth 2 (two) times daily. 07/09/20 07/11/20  [name], MD    Scheduled Meds:  aspirin EC  81 mg Oral Daily   atorvastatin  40 mg Oral Daily   carvedilol  6.25 mg Oral BID WC   enoxaparin (LOVENOX) injection  0.5 mg/kg Subcutaneous Q24H   furosemide  40 mg Intravenous Q12H   guaiFENesin  1,200 mg Oral BID   insulin aspart  0-5 Units Subcutaneous QHS   insulin aspart  0-9 Units Subcutaneous TID WC   isosorbide-hydrALAZINE  1 tablet Oral TID   methylPREDNISolone (SOLU-MEDROL) injection  60 mg Intravenous Q12H   Continuous Infusions:  azithromycin Stopped (04/20/22 1154)   PRN Meds:.acetaminophen **OR** acetaminophen, ipratropium-albuterol, magnesium hydroxide, morphine injection, ondansetron **OR** ondansetron (ZOFRAN) IV, oxyCODONE-acetaminophen, traZODone   Level 4 consult    Thank you for allowing [facility] to participate in this patient's care.  We will continue to follow along with you.  Renold Don, MD Advanced Bronchoscopy PCCM [facility]    *This note was dictated using voice recognition software/Dragon.  Despite best efforts to proofread, errors can occur which can change the meaning. Any transcriptional errors that result from this process are unintentional and may not be fully corrected at the time of dictation.

## 2022-04-20 NOTE — ED Notes (Signed)
The pt has requested to be placed back on the Bi-pap after refusing to use the Bi-pap during the night. Respiratory has been made aware of the Bi-pap being placed back on the pt and the pt is resting comfortably at this time, equal chest rise and fall noted. Close monitoring continued.

## 2022-04-20 NOTE — Progress Notes (Signed)
PROGRESS NOTE   HPI was taken from Dr. Sidney Ace: Chris [name] is a 61 y.o. male with medical history significant for CHF, COPD, coronary artery disease, depression, stage IIIb chronic kidney disease, type 2 diabetes mellitus, GERD, gout, hypertension and atrial flutter, who presented to the emergency room with acute onset of worsening shortness of breath over the last 4 days with associated cough as well as nausea and vomiting.  She admitted to associated chest pain as well as generalized weakness..  No fever or chills.  No diarrhea or abdominal pain.  No melena or bright red bleeding per rectum.  No bleeding diathesis.  She denies any dysuria, oliguria, urinary frequency or urgency or flank pain.   ED Course: Upon presentation to the emergency room, BP was 196/131 with respiratory rate of 26 and given tachypnea to 33 and respiratory distress he was placed on BiPAP with a pulse oximetry of 97% on 35% FiO2.  Labs revealed normal CBC.  BMP showed a creatinine of 1.73 with BUN of 24 compared to 43 and 2.26 on 02/03/2022.  BNP was 547.8 above previous levels in July.  High sensitive troponin was 54 and later 64. EKG as reviewed by me :   EKG showed normal sinus rhythm with fusion complexes, T wave inversion laterally, poor R wave progression and early repolarization in V3. Imaging: Two-view chest x-ray showed chronic cardiomegaly and chronic bronchial thickening with minor bibasilar atelectasis.   The patient was given 40 mg of IV Lasix, DuoNebs twice, 125 mg of IV Solu-Medrol and 500 mill IV normal saline.  He will be admitted to a progressive unit bed for further evaluation and management.   Chris [name]  MEQ:[id number] DOB: [birth year] DOA: 04/19/2022 PCP: [name], MD   Assessment & Plan:   Principal Problem:   Acute respiratory failure with hypoxia [facility]) Active Problems:   COPD exacerbation ([place])   Acute on chronic diastolic CHF (congestive heart failure) ([facility])   Type 2 diabetes  mellitus with stage 3 chronic kidney disease ([place])   Dyslipidemia   Gout  Assessment and Plan: Acute hypoxic respiratory failure: likely secondary to COPD & CHF. Continue on supplemental oxygen and wean as tolerated. Currently on BiPAP, wean as tolerated    COPD exacerbation: continue on IV azithromycin, steroids, bronchodilators & encourage incentive spirometry. Started on mucinex and flutter valve ordered, as pt has difficulty coughing up sputum.  COVID19 & influenza are both neg. Pulmon consulted, Dr. Patsey Berthold    Acute on chronic diastolic CHF: echo on 2/97/9892 showed EF of 50% with grade 1 diastolic dysfunction and mild to moderate left atrial dilatation. Continue on lasix. Monitor I/Os. Continue on home dose of coreg, aspirin, statin, bidil   Likely AKI: Cr is trending up from day prior. Avoid nephrotoxic meds   DM2: likely poorly controlled. Continue on SSI w/ accuchecks   Gout: will hold home dose of allopurinol as Cr is trending up   HLD: continue on statin   Obesity: BMI 36.3. Complicates overall care & prognosis   Smoker: 1ppd x 30 years. Received smoking cessation counseling x 5 mins   DVT prophylaxis:  lovenox Code Status: full  Family Communication:  Disposition Plan: likely d/c back home   Level of care: Progressive  Status is: Inpatient Remains inpatient appropriate because: severity of illness   Consultants:  Pulmon: Dr. Patsey Berthold  Procedures:   Antimicrobials: azithromycin   Subjective: Pt c/o shortness of breath & unable to bring up sputum.   Objective:  Vitals:   04/20/22 0000 04/20/22 0056 04/20/22 0300 04/20/22 0640  BP: (!) 160/98 (!) 160/98 (!) 152/99 (!) 164/103  Pulse: 82 79 77 76  Resp:  (!) 25  (!) 30  Temp:  98.2 F (36.8 C)  98.6 F (37 C)  TempSrc:  Oral  Oral  SpO2: 98% 99% 94% 99%  Weight:      Height:        Intake/Output Summary (Last 24 hours) at 04/20/2022 0834 Last data filed at 04/20/2022 5329 Gross per 24 hour  Intake  100 ml  Output 800 ml  Net -700 ml   Filed Weights   04/19/22 1900  Weight: 121.6 kg    Examination:  General exam: Appears restless and uncomfortable. Obese  Respiratory system: Course breath sounds b/l. Wheezes b/l  Cardiovascular system: S1 & S2+. No rubs, gallops or clicks.  Gastrointestinal system: Abdomen is obese, soft and nontender. Normal bowel sounds heard. Central nervous system: Alert and oriented. Moves all extremities  Psychiatry: Judgement and insight appear normal. Mood & affect appropriate.     Data Reviewed: I have personally reviewed following labs and imaging studies  CBC: Recent Labs  Lab 04/19/22 1712 04/20/22 0607  WBC 9.5 8.3  NEUTROABS 6.5  --   HGB 14.8 15.0  HCT 45.7 45.4  MCV 90.7 89.2  PLT 196 924   Basic Metabolic Panel: Recent Labs  Lab 04/19/22 1605 04/20/22 0607  NA 140 138  K 4.2 4.8  CL 105 107  CO2 25 22  GLUCOSE 80 137*  BUN 24* 36*  CREATININE 1.73* 1.93*  CALCIUM 9.4 9.6   GFR: Estimated Creatinine Clearance: 55.5 mL/min (A) (by C-G formula based on SCr of 1.93 mg/dL (H)). Liver Function Tests: Recent Labs  Lab 04/19/22 1605  AST 29  ALT 21  ALKPHOS 78  BILITOT 0.7  PROT 8.1  ALBUMIN 4.0   No results for input(s): "LIPASE", "AMYLASE" in the last 168 hours. No results for input(s): "AMMONIA" in the last 168 hours. Coagulation Profile: No results for input(s): "INR", "PROTIME" in the last 168 hours. Cardiac Enzymes: No results for input(s): "CKTOTAL", "CKMB", "CKMBINDEX", "TROPONINI" in the last 168 hours. BNP (last 3 results) No results for input(s): "PROBNP" in the last 8760 hours. HbA1C: No results for input(s): "HGBA1C" in the last 72 hours. CBG: No results for input(s): "GLUCAP" in the last 168 hours. Lipid Profile: No results for input(s): "CHOL", "HDL", "LDLCALC", "TRIG", "CHOLHDL", "LDLDIRECT" in the last 72 hours. Thyroid Function Tests: No results for input(s): "TSH", "T4TOTAL", "FREET4",  "T3FREE", "THYROIDAB" in the last 72 hours. Anemia Panel: No results for input(s): "VITAMINB12", "FOLATE", "FERRITIN", "TIBC", "IRON", "RETICCTPCT" in the last 72 hours. Sepsis Labs: No results for input(s): "PROCALCITON", "LATICACIDVEN" in the last 168 hours.  Recent Results (from the past 240 hour(s))  Resp Panel by RT-PCR (Flu A&B, Covid) Anterior Nasal Swab     Status: None   Collection Time: 04/19/22  5:13 PM   Specimen: Anterior Nasal Swab  Result Value Ref Range Status   SARS Coronavirus 2 by RT PCR NEGATIVE NEGATIVE Final    Comment: (NOTE) SARS-CoV-2 target nucleic acids are NOT DETECTED.  The SARS-CoV-2 RNA is generally detectable in upper respiratory specimens during the acute phase of infection. The lowest concentration of SARS-CoV-2 viral copies this assay can detect is 138 copies/mL. A negative result does not preclude SARS-Cov-2 infection and should not be used as the sole basis for treatment or other patient management decisions. A  negative result may occur with  improper specimen collection/handling, submission of specimen other than nasopharyngeal swab, presence of viral mutation(s) within the areas targeted by this assay, and inadequate number of viral copies(<138 copies/mL). A negative result must be combined with clinical observations, patient history, and epidemiological information. The expected result is Negative.  Fact Sheet for Patients:  EntrepreneurPulse.com.au  Fact Sheet for Healthcare Providers:  IncredibleEmployment.be  This test is no t yet approved or cleared by the [place] FDA and  has been authorized for detection and/or diagnosis of SARS-CoV-2 by FDA under an Emergency Use Authorization (EUA). This EUA will remain  in effect (meaning this test can be used) for the duration of the COVID-19 declaration under Section 564(b)(1) of the Act, 21 U.S.C.section 360bbb-3(b)(1), unless the authorization is  terminated  or revoked sooner.       Influenza A by PCR NEGATIVE NEGATIVE Final   Influenza B by PCR NEGATIVE NEGATIVE Final    Comment: (NOTE) The Xpert Xpress SARS-CoV-2/FLU/RSV plus assay is intended as an aid in the diagnosis of influenza from Nasopharyngeal swab specimens and should not be used as a sole basis for treatment. Nasal washings and aspirates are unacceptable for Xpert Xpress SARS-CoV-2/FLU/RSV testing.  Fact Sheet for Patients: EntrepreneurPulse.com.au  Fact Sheet for Healthcare Providers: IncredibleEmployment.be  This test is not yet approved or cleared by the [place] FDA and has been authorized for detection and/or diagnosis of SARS-CoV-2 by FDA under an Emergency Use Authorization (EUA). This EUA will remain in effect (meaning this test can be used) for the duration of the COVID-19 declaration under Section 564(b)(1) of the Act, 21 U.S.C. section 360bbb-3(b)(1), unless the authorization is terminated or revoked.  Performed at [facility], [address] [place], [place] [address]          Radiology Studies: DG Chest 2 View  Result Date: 04/19/2022 CLINICAL DATA:  Chest pain. EXAM: CHEST - 2 VIEW COMPARISON:  Most recent radiograph 01/04/2022 chest CT 04/27/2021 reviewed FINDINGS: Chronic cardiomegaly. Unchanged mediastinal contours with aortic atherosclerosis. Chronic bronchial thickening with minor bibasilar atelectasis. No confluent consolidation. No pleural effusion or pneumothorax. No features of pulmonary edema. No acute osseous abnormalities. IMPRESSION: Chronic cardiomegaly. Chronic bronchial thickening and minor bibasilar atelectasis. Electronically Signed   By: Keith Rake M.D.   On: 04/19/2022 16:22        Scheduled Meds:  enoxaparin (LOVENOX) injection  0.5 mg/kg Subcutaneous Q24H   furosemide  40 mg Intravenous Q12H   insulin aspart  0-5 Units Subcutaneous QHS   insulin aspart   0-9 Units Subcutaneous TID WC   methylPREDNISolone (SOLU-MEDROL) injection  40 mg Intravenous Q12H   Followed by   Derrill Memo ON 04/21/2022] predniSONE  40 mg Oral Q breakfast   Continuous Infusions:  cefTRIAXone (ROCEPHIN)  IV Stopped (04/19/22 2116)     LOS: 1 day    Time spent: 35 mins     Wyvonnia Dusky, MD [facility] Pager [phone]  If 7PM-7AM, please contact night-coverage [email or web address] 04/20/2022, 8:34 AM

## 2022-04-20 NOTE — Progress Notes (Signed)
Nutrition Brief Note  RD pulled to chart secondary to CHF  Wt Readings from Last 15 Encounters:  04/19/22 121.6 kg  02/03/22 117.5 kg  01/29/22 120 kg  01/04/22 120 kg  12/21/21 120.2 kg  11/21/21 122 kg  11/19/21 122.5 kg  10/15/21 123.4 kg  09/10/21 123.1 kg  09/03/21 122.5 kg  08/20/21 121.1 kg  07/10/21 124.7 kg  06/17/21 126.6 kg  05/14/21 126.6 kg  11/27/20 126.8 kg   Pt with medical history significant for CHF, COPD, coronary artery disease, depression, stage IIIb chronic kidney disease, type 2 diabetes mellitus, GERD, gout, hypertension and atrial flutter, who presented with acute onset of worsening shortness of breath over the last 4 days with associated cough as well as nausea and vomiting.   Pt admitted with respiratory failure, COPD exacerbation, and CHF.   RD provided "Low Sodium Nutrition Therapy" handout from [facility]; attached to AVS/ discharge summary.   Medications reviewed and include lasix.   Lab Results  Component Value Date   HGBA1C 6.2 12/21/2021   PTA DM medications are 10 mg farxiga daily.   Labs reviewed: CBGS: 101 (inpatient orders for glycemic control are 0-9 units insulin aspart TID with meals).    Body mass index is 36.35 kg/m. Patient meets criteria for obesity, class II based on current BMI. Obesity is a complex, chronic medical condition that is optimally managed by a multidisciplinary care team. Weight loss is not an ideal goal for an acute inpatient hospitalization. However, if further work-up for obesity is warranted, consider outpatient referral to outpatient bariatric service and/or [facility].    Current diet order is carb modified, patient is consuming approximately n/a% of meals at this time. Labs and medications reviewed.   No nutrition interventions warranted at this time. If nutrition issues arise, please consult RD.   Loistine Chance, RD, LDN, [place] Registered Dietitian  II Certified Diabetes Care and Education Specialist Please refer to [facility] for RD and/or RD on-call/weekend/after hours pager

## 2022-04-20 NOTE — Progress Notes (Signed)
Bidil medication not delivered during shift. Pharmacy contacted. Pt did not have any medications tubed from ED and medication not in pt bin. Sent another request for medication to pharmacy.

## 2022-04-20 NOTE — Discharge Instructions (Addendum)
Agency Name: [facility] Agency Address: [address] [place], [place] [address] Phone: [phone] Website: [email or web address] Service(s) Offered: Housing services, self-sufficiency, congregate meal  program, and individual development account program.  Agency Name: [facility] Address: [address] , [place], [place] [address] Phone: [phone] Email: [email or web address] Website: [email or web address] Service(s) Offered: Housing the homeless, feeding the hungry, Scientific laboratory technician, job and education related services.  Agency Name: [facility] Address: [address], [place], [place] [address] Phone: [phone] Email: [email or web address] Service(s) Offered: Counseling, problem pregnancy, advocacy for Hispanics,  limited emergency financial assistance.  Agency Name: [facility] [employer] Address: [address] [place], [place] [address] Phone: [phone] Website: [email or web address] Service(s) Offered: Child support services; child welfare services; SNAPS;  Medicaid; work first family assistance; and aid with fuel,  rent, food and medicine.  Agency Name: [facility] Address: [place]. [address] [place],  [address] Phone: [phone] or [phone] Email: [email or web address] Service(s) Offered: Family services and transient assistance; emergency food,  fuel, clothing, limited furniture, utilities; budget counseling,  general counseling; give a kid a coat; thrift store; Christmas  food and toys. Utility assistance, food pantry, rental  assistance, life sustaining medicine   Low Sodium Nutrition Therapy  Eating less sodium can help you if you have high blood pressure, heart failure, or kidney or liver disease.   Your body needs a little sodium, but too much sodium can cause your body to hold onto extra water. This extra water will raise your  blood pressure and can cause damage to your heart, kidneys, or liver as they are forced to work harder.   Sometimes you can see how the extra fluid affects you because your hands, legs, or belly swell. You may also hold water around your heart and lungs, which makes it hard to breathe.   Even if you take medication for blood pressure or a water pill (diuretic) to remove fluid, it is still important to have less salt in your diet.   Check with your primary care provider before drinking alcohol since it may affect the amount of fluid in your body and how your heart, kidneys, or liver work. Sodium in Food A low-sodium meal plan limits the sodium that you get from food and beverages to 1,500-2,000 milligrams (mg) per day. Salt is the main source of sodium. Read the nutrition label on the package to find out how much sodium is in one serving of a food.  Select foods with 140 milligrams (mg) of sodium or less per serving.  You may be able to eat one or two servings of foods with a little more than 140 milligrams (mg) of sodium if you are closely watching how much sodium you eat in a day.  Check the serving size on the label. The amount of sodium listed on the label shows the amount in one serving of the food. So, if you eat more than one serving, you will get more sodium than the amount listed.  Tips Cutting Back on Sodium Eat more fresh foods.  Fresh fruits and vegetables are low in sodium, as well as frozen vegetables and fruits that have no added juices or sauces.  Fresh meats are lower in sodium than processed meats, such as bacon, sausage, and hotdogs.  Not all processed foods are unhealthy, but some processed foods may have too much sodium.  Eat less salt at the table and when cooking. One of the ingredients in salt is sodium.  One teaspoon of table salt has 2,300 milligrams of sodium.  Leave the salt out of recipes for pasta, casseroles, and soups. Be a Paramedic.  Food packages that say  "Salt-free", sodium-free", "very low sodium," and "low sodium" have less than 140 milligrams of sodium per serving.  Beware of products identified as "Unsalted," "No Salt Added," "Reduced Sodium," or "Lower Sodium." These items may still be high in sodium. You should always check the nutrition label. Add flavors to your food without adding sodium.  Try lemon juice, lime juice, or vinegar.  Dry or fresh herbs add flavor.  Buy a sodium-free seasoning blend or make your own at home. You can purchase salt-free or sodium-free condiments like barbeque sauce in stores and online. Ask your registered dietitian nutritionist for recommendations and where to find them.   Eating in Restaurants Choose foods carefully when you eat outside your home. Restaurant foods can be very high in sodium. Many restaurants provide nutrition facts on their menus or their websites. If you cannot find that information, ask your server. Let your server know that you want your food to be cooked without salt and that you would like your salad dressing and sauces to be served on the side.    Foods Recommended Food Group Foods Recommended  Grains Bread, bagels, rolls without salted tops Homemade bread made with reduced-sodium baking powder Cold cereals, especially shredded wheat and puffed rice Oats, grits, or cream of wheat Pastas, quinoa, and rice Popcorn, pretzels or crackers without salt Corn tortillas  Protein Foods Fresh meats and fish; [place] bacon (check the nutrition labels - make sure they are not packaged in a sodium solution) Canned or packed tuna (no more than 4 ounces at 1 serving) Beans and peas Soybeans) and tofu Eggs Nuts or nut butters without salt  Dairy Milk or milk powder Plant milks, such as rice and soy Yogurt, including Greek yogurt Small amounts of natural cheese (blocks of cheese) or reduced-sodium cheese can be used in moderation. (Swiss, ricotta, and fresh mozzarella cheese are lower in sodium  than the others) Cream Cheese Low sodium cottage cheese  Vegetables Fresh and frozen vegetables without added sauces or salt Homemade soups (without salt) Low-sodium, salt-free or sodium-free canned vegetables and soups  Fruit Fresh and canned fruits Dried fruits, such as raisins, cranberries, and prunes  Oils Tub or liquid margarine, regular or without salt [place], corn, peanut, olive, safflower, or sunflower oils  Condiments Fresh or dried herbs such as basil, bay leaf, dill, mustard (dry), nutmeg, paprika, parsley, rosemary, sage, or thyme.  Low sodium ketchup Vinegar  Lemon or lime juice Pepper, red pepper flakes, and cayenne. Hot sauce contains sodium, but if you use just a drop or two, it will not add up to much.  Salt-free or sodium-free seasoning mixes and marinades Simple salad dressings: vinegar and oil   Foods Not Recommended Food Group Foods Not Recommended  Grains Breads or crackers topped with salt Cereals (hot/cold) with more than 300 mg sodium per serving Biscuits, cornbread, and other "quick" breads prepared with baking soda Pre-packaged bread crumbs Seasoned and packaged rice and pasta mixes Self-rising flours  Protein Foods Cured meats: Bacon, ham, sausage, pepperoni and hot dogs Canned meats (chili, vienna sausage, or sardines) Smoked fish and meats Frozen meals that have more than 600 mg of sodium per serving Egg substitute (with added sodium)  Dairy Buttermilk Processed cheese spreads Cottage cheese (1 cup may have over 500 mg of sodium; look for low-sodium.)  American or feta cheese Shredded Cheese has more sodium than blocks of cheese String cheese  Vegetables Canned vegetables (unless they are salt-free, sodium-free or low sodium) Frozen vegetables with seasoning and sauces Sauerkraut and pickled vegetables Canned or dried soups (unless they are salt-free, sodium-free, or low sodium) [place] fries and onion rings  Fruit Dried fruits preserved with  additives that have sodium  Oils Salted butter or margarine, all types of olives  Condiments Salt, sea salt, kosher salt, onion salt, and garlic salt Seasoning mixes with salt Bouillon cubes Ketchup Barbeque sauce and Worcestershire sauce unless low sodium Soy sauce Salsa, pickles, olives, relish Salad dressings: ranch, blue cheese, [place], and [place].   Low Sodium Sample 1-Day Menu  Breakfast 1 cup cooked oatmeal  1 slice whole wheat bread toast  1 tablespoon peanut butter without salt  1 banana  1 cup 1% milk  Lunch Tacos made with: 2 corn tortillas   cup black beans, low sodium   cup roasted or grilled chicken (without skin)   avocado  Squeeze of lime juice  1 cup salad greens  1 tablespoon low-sodium salad dressing   cup strawberries  1 orange  Afternoon Snack 1/3 cup grapes  6 ounces yogurt  Evening Meal 3 ounces herb-baked fish  1 baked potato  2 teaspoons olive oil   cup cooked carrots  2 thick slices tomatoes on:  2 lettuce leaves  1 teaspoon olive oil  1 teaspoon balsamic vinegar  1 cup 1% milk  Evening Snack 1 apple   cup almonds without salt   Low-Sodium Vegetarian (Lacto-Ovo) Sample 1-Day Menu  Breakfast 1 cup cooked oatmeal  1 slice whole wheat toast  1 tablespoon peanut butter without salt  1 banana  1 cup 1% milk  Lunch Tacos made with: 2 corn tortillas   cup black beans, low sodium   cup roasted or grilled chicken (without skin)   avocado  Squeeze of lime juice  1 cup salad greens  1 tablespoon low-sodium salad dressing   cup strawberries  1 orange  Evening Meal Stir fry made with:  cup tofu  1 cup brown rice   cup broccoli   cup green beans   cup peppers   tablespoon peanut oil  1 orange  1 cup 1% milk  Evening Snack 4 strips celery  2 tablespoons hummus  1 hard-boiled egg   Low-Sodium Vegan Sample 1-Day Menu  Breakfast 1 cup cooked oatmeal  1 tablespoon peanut butter without salt  1 cup blueberries  1 cup soymilk  fortified with calcium, vitamin B12, and vitamin D  Lunch 1 small whole wheat pita   cup cooked lentils  2 tablespoons hummus  4 carrot sticks  1 medium apple  1 cup soymilk fortified with calcium, vitamin B12, and vitamin D  Evening Meal Stir fry made with:  cup tofu  1 cup brown rice   cup broccoli   cup green beans   cup peppers   tablespoon peanut oil  1 cup cantaloupe  Evening Snack 1 cup soy yogurt   cup mixed nuts  Copyright 2020  Academy of Nutrition and Dietetics. All rights reserved  Sodium Free Flavoring Tips  When cooking, the following items may be used for flavoring instead of salt or seasonings that contain sodium. Remember: A little bit of spice goes a long way! Be careful not to overseason. Spice Blend Recipe (makes about ? cup) 5 teaspoons onion powder  2 teaspoons garlic powder  2 teaspoons  paprika  2 teaspoon dry mustard  1 teaspoon crushed thyme leaves   teaspoon white pepper   teaspoon celery seed Food Item Flavorings  Beef Basil, bay leaf, caraway, curry, dill, dry mustard, garlic, grape jelly, green pepper, mace, marjoram, mushrooms (fresh), nutmeg, onion or onion powder, parsley, pepper, rosemary, sage  Chicken Basil, cloves, cranberries, mace, mushrooms (fresh), nutmeg, oregano, paprika, parsley, pineapple, saffron, sage, savory, tarragon, thyme, tomato, turmeric  Egg Chervil, curry, dill, dry mustard, garlic or garlic powder, green pepper, jelly, mushrooms (fresh), nutmeg, onion powder, paprika, parsley, rosemary, tarragon, tomato  Fish Basil, bay leaf, chervil, curry, dill, dry mustard, green pepper, lemon juice, marjoram, mushrooms (fresh), paprika, pepper, tarragon, tomato, turmeric  Lamb Cloves, curry, dill, garlic or garlic powder, mace, mint, mint jelly, onion, oregano, parsley, pineapple, rosemary, tarragon, thyme  Pork Applesauce, basil, caraway, chives, cloves, garlic or garlic powder, onion or onion powder, rosemary, thyme  Veal  Apricots, basil, bay leaf, currant jelly, curry, ginger, marjoram, mushrooms (fresh), oregano, paprika  Vegetables Basil, dill, garlic or garlic powder, ginger, lemon juice, mace, marjoram, nutmeg, onion or onion powder, tarragon, tomato, sugar or sugar substitute, salt-free salad dressing, vinegar  Desserts Allspice, anise, cinnamon, cloves, ginger, mace, nutmeg, vanilla extract, other extracts   Copyright 2020  Academy of Nutrition and Dietetics. All rights reserved  Fluid Restricted Nutrition Therapy  You have been prescribed this diet because your condition affects how much fluid you can eat or drink. If your heart, liver, or kidneys aren't working properly, you may not be able to effectively eliminate fluids from the body and this may cause swelling (edema) in the legs, arms, and/or stomach. Drink no more than _________ liters or ________ ounces or ________cups of fluid per day.  You don't need to stop eating or drinking the same fluids you normally would, but you may need to eat or drink less than usual.  Your registered dietitian nutritionist will help you determine the correct amount of fluid to consume during the day Breakfast Include fluids taken with medications  Lunch Include fluids taken with medications  Dinner Include fluids taken with medications  Bedtime Snack Include fluids taken with medications     Tips What Are Fluids?  A fluid is anything that is liquid or anything that would melt if left at room temperature. You will need to count these foods and liquids--including any liquid used to take medication--as part of your daily fluid intake. Some examples are: Alcohol (drink only with your doctor's permission)  Coffee, tea, and other hot beverages  Gelatin (Jell-O)  Gravy  Ice cream, sherbet, sorbet  Ice cubes, ice chips  Milk, liquid creamer  Nutritional supplements  Popsicles  Vegetable and fruit juices; fluid in canned fruit  Watermelon  Yogurt  Soft drinks,  lemonade, limeade  Soups  Syrup How Do I Measure My Fluid Intake? Record your fluid intake daily.  Tip: Every day, each time you eat or drink fluids, pour water in the same amount into an empty container that can hold the same amount of fluids you are allowed daily. This may help you keep track of how much fluid you are taking in throughout the day.  To accurately keep track of how much liquid you take in, measure the size of the cups, glasses, and bowls you use. If you eat soup, measure how much of it is liquid and how much is solid (such as noodles, vegetables, meat). Conversions for Measuring Fluid Intake  Milliliters (mL) Liters (L) Ounces (oz)  Cups (c)  1000 1 32 4  1200 1.2 40 5  1500 1.5 50 6 1/4  1800 1.8 60 7 1/2  2000 2 67 8 1/3  Tips to Reduce Your Thirst Chew gum or suck on hard candy.  Rinse or gargle with mouthwash. Do not swallow.  Ice chips or popsicles my help quench thirst, but this too needs to be calculated into the total restriction. Melt ice chips or cubes first to figure out how much fluid they produce (for example, experiment with melting  cup ice chips or 2 ice cubes).  Add a lemon wedge to your water.  Limit how much salt you take in. A high salt intake might make you thirstier.  Don't eat or drink all your allowed liquids at once. Space your liquids out through the day.  Use small glasses and cups and sip slowly. If allowed, take your medications with fluids you eat or drink during a meal.   Fluid-Restricted Nutrition Therapy Sample 1-Day Menu  Breakfast 1 slice wheat toast  1 tablespoon peanut butter  1/2 cup yogurt (120 milliliters)  1/2 cup blueberries  1 cup milk (240 milliliters)   Lunch 3 ounces sliced [place]  2 slices whole wheat bread  1/2 cup lettuce for sandwich  2 slices tomato for sandwich  1 ounce reduced-fat, reduced-sodium cheese  1/2 cup fresh carrot sticks  1 banana  1 cup unsweetened tea (240 milliliters)   Evening Meal 8 ounces soup  (240 milliliters)  3 ounces salmon  1/2 cup quinoa  1 cup green beans  1 cup mixed greens salad  1 tablespoon olive oil  1 cup coffee (240 milliliters)  Evening Snack 1/2 cup sliced peaches  1/2 cup frozen yogurt (120 milliliters)  1 cup water (240 milliliters)  Copyright 2020  Academy of Nutrition and Dietetics. All rights reserved

## 2022-04-20 NOTE — ED Notes (Signed)
Pt refuses BIPAP, pt educated regarding risks of related to noncompliance. Pt agrees to continue using BIPAP

## 2022-04-21 DIAGNOSIS — E1122 Type 2 diabetes mellitus with diabetic chronic kidney disease: Secondary | ICD-10-CM | POA: Diagnosis not present

## 2022-04-21 DIAGNOSIS — F1410 Cocaine abuse, uncomplicated: Secondary | ICD-10-CM

## 2022-04-21 DIAGNOSIS — N1832 Chronic kidney disease, stage 3b: Secondary | ICD-10-CM

## 2022-04-21 DIAGNOSIS — I5033 Acute on chronic diastolic (congestive) heart failure: Secondary | ICD-10-CM | POA: Diagnosis not present

## 2022-04-21 DIAGNOSIS — J441 Chronic obstructive pulmonary disease with (acute) exacerbation: Secondary | ICD-10-CM | POA: Diagnosis not present

## 2022-04-21 DIAGNOSIS — E669 Obesity, unspecified: Secondary | ICD-10-CM | POA: Insufficient documentation

## 2022-04-21 DIAGNOSIS — J9601 Acute respiratory failure with hypoxia: Secondary | ICD-10-CM | POA: Diagnosis not present

## 2022-04-21 LAB — BASIC METABOLIC PANEL
Anion gap: 7 (ref 5–15)
BUN: 50 mg/dL — ABNORMAL HIGH (ref 6–20)
CO2: 25 mmol/L (ref 22–32)
Calcium: 8.8 mg/dL — ABNORMAL LOW (ref 8.9–10.3)
Chloride: 106 mmol/L (ref 98–111)
Creatinine, Ser: 2.2 mg/dL — ABNORMAL HIGH (ref 0.61–1.24)
GFR, Estimated: 34 mL/min — ABNORMAL LOW (ref >60)
Glucose, Bld: 105 mg/dL — ABNORMAL HIGH (ref 70–99)
Potassium: 4.6 mmol/L (ref 3.5–5.1)
Sodium: 138 mmol/L (ref 135–145)

## 2022-04-21 LAB — RESPIRATORY PANEL BY PCR

## 2022-04-21 LAB — GLUCOSE, CAPILLARY
Glucose-Capillary: 122 mg/dL — ABNORMAL HIGH (ref 70–99)
Glucose-Capillary: 148 mg/dL — ABNORMAL HIGH (ref 70–99)
Glucose-Capillary: 147 mg/dL — ABNORMAL HIGH (ref 70–99)
Glucose-Capillary: 145 mg/dL — ABNORMAL HIGH (ref 70–99)

## 2022-04-21 LAB — CBC
HCT: 43.4 % (ref 39.0–52.0)
Hemoglobin: 14.2 g/dL (ref 13.0–17.0)
MCH: 29.5 pg (ref 26.0–34.0)
MCHC: 32.7 g/dL (ref 30.0–36.0)
MCV: 90 fL (ref 80.0–100.0)
Platelets: 202 10*3/uL (ref 150–400)
RBC: 4.82 MIL/uL (ref 4.22–5.81)
RDW: 14.8 % (ref 11.5–15.5)
WBC: 12.2 10*3/uL — ABNORMAL HIGH (ref 4.0–10.5)
nRBC: 0 % (ref 0.0–0.2)

## 2022-04-21 LAB — BRAIN NATRIURETIC PEPTIDE: B Natriuretic Peptide: 175.1 pg/mL — ABNORMAL HIGH (ref 0.0–100.0)

## 2022-04-21 MED ORDER — ORAL CARE MOUTH RINSE: 15.0000 mL | OROMUCOSAL | Status: DC | PRN

## 2022-04-21 MED ORDER — FUROSEMIDE 40 MG PO TABS
40.0000 mg | ORAL_TABLET | Freq: Two times a day (BID) | ORAL | Status: DC
  Administered 2022-04-21 – 2022-04-26 (×10): 40 mg via ORAL
  Filled 2022-04-21 (×10): qty 1

## 2022-04-21 MED ORDER — DAPAGLIFLOZIN PROPANEDIOL 10 MG PO TABS
10.0000 mg | ORAL_TABLET | Freq: Every day | ORAL | Status: DC
  Administered 2022-04-22 – 2022-04-26 (×5): 10 mg via ORAL
  Filled 2022-04-21 (×6): qty 1

## 2022-04-21 NOTE — Assessment & Plan Note (Signed)
BMI 35.37.

## 2022-04-21 NOTE — Hospital Course (Addendum)
Chris [name] is a 61 y.o. male with medical history significant for CHF, COPD, coronary artery disease, depression, stage IIIb chronic kidney disease, type 2 diabetes mellitus, GERD, gout, hypertension and atrial flutter, who presented to the emergency room with acute onset of worsening shortness of breath over the last 4 days with associated cough as well as nausea and vomiting.  She admitted to associated chest pain as well as generalized weakness..  No fever or chills.  No diarrhea or abdominal pain.  No melena or bright red bleeding per rectum.  No bleeding diathesis.  She denies any dysuria, oliguria, urinary frequency or urgency or flank pain.   ED Course: Upon presentation to the emergency room, BP was 196/131 with respiratory rate of 26 and given tachypnea to 33 and respiratory distress he was placed on BiPAP with a pulse oximetry of 97% on 35% FiO2.  Labs revealed normal CBC.  BMP showed a creatinine of 1.73 with BUN of 24 compared to 43 and 2.26 on 02/03/2022.  BNP was 547.8 above previous levels in July.  High sensitive troponin was 54 and later 64. EKG as reviewed by me :   EKG showed normal sinus rhythm with fusion complexes, T wave inversion laterally, poor R wave progression and early repolarization in V3. Imaging: Two-view chest x-ray showed chronic cardiomegaly and chronic bronchial thickening with minor bibasilar atelectasis.   The patient was given 40 mg of IV Lasix, DuoNebs twice, 125 mg of IV Solu-Medrol and 500 mill IV normal saline.  He will be admitted to a progressive unit bed for further evaluation and management.  04/21/2022.  As of this morning patient on Solu-Medrol 60 mg IV twice daily and Lasix 40 mg IV twice daily.  With creatinine rising will change to oral Lasix 40 mg twice daily.  Patient was also found to have positive cocaine in his system.  04/22/2022.  Patient still with a lot of bronchospasm requiring IV Solu-Medrol.  RSV test positive.  11/10.  Patient becoming  more agitated during the day.  We will place empirically on alcohol withdrawal protocol.  11/11.  Patient denies drinking any alcohol.  Not agitated today.  Feels okay.  Advised slow deep breaths.  Continue incentive spirometer

## 2022-04-21 NOTE — Progress Notes (Signed)
NAME:  Chris [name], MRN:  [id number], DOB:  01-01-1961, LOS: 2 ADMISSION DATE:  04/19/2022, CONSULTATION DATE: 04/20/2022 REFERRING MD: Marita Snellen CHIEF COMPLAINT: COPD exacerbation  History of Present Illness:  Chris [name] is a 61 year old current smoker, with a history of COPD (FEV1 unknown), HFrEF, LVH and past history of atrial flutter who presented to [facility] on 19 April 2022 after a 4-day history of increasing shortness of breath and a dry nonproductive cough, orthopnea, generalized malaise and nausea and vomiting.  He also had associated chest pain and generalized weakness.  Did not have any fevers or chills but felt "off".  No lower extremity edema.  He is not certain if he has had sick contacts in the home.  He does note that though he feels congested in his chest he cannot expectorate.  ED/hospital course: Upon presentation in the emergency room patient was noted to be hypertensive tachypneic and in respiratory distress.  He was placed on BiPAP with improvement on his tachypnea and work of breathing.  BNP was 547.8, chest x-ray was unrevealing except for chronic cardiomegaly and chronic bronchial thickening, patient's troponins were mildly elevated.  He was treated with Lasix, DuoNebs and IV Solu-Medrol.  He was admitted to progressive care unit where he is currently at.  The patient has noted little improvement on his symptoms since admission.  Stated he felt that the BiPAP was "smothering him" but at the same time wants to keep using it at nighttime.  Patient was admitted with the impression of COPD exacerbation and possible decompensation of known congestive heart failure.  Patient was recently evaluated by Dr. Genia Harold at [facility] he was scheduled to have PFTs but these have not been done as of yet.  At his visit with Dr. Genia Harold the patient was placed on triple therapy for management of his COPD with LAMA/ICS/LAMA (Symbicort/Spiriva).  PCCM has been consulted to  help with management of COPD exacerbation.  I have reviewed all available records and imaging.  Pertinent  Medical History  COPD, FEV1 unknown Diabetes mellitus HFrEF (combined systolic/diastolic) LVH Hypertension Tobacco dependence due to cigarettes  Significant Hospital Events: Including procedures, antibiotic start and stop dates in addition to other pertinent events   11/6: Admitted due to acute respiratory failure with hypoxia, COPD exacerbation versus decompensation of HFrEF 11/7: PCCM consulted for assistance with management of COPD exacerbation 11/8: Respiratory pathogens by PCR not done as requested, reordered  Interim History / Subjective:  Patient states that he notes little improvement on his shortness of breath and overall symptoms since admission.  He does state however that his nausea and vomiting have improved.  Objective   Blood pressure (!) 137/92, pulse 78, temperature 97.7 F (36.5 C), resp. rate 18, height 6' (1.829 m), weight 118.3 kg, SpO2 97 %.        Intake/Output Summary (Last 24 hours) at 04/21/2022 1246 Last data filed at 04/21/2022 1100 Gross per 24 hour  Intake 1240 ml  Output 3875 ml  Net -2635 ml    Filed Weights   04/19/22 1900 04/21/22 0500  Weight: 121.6 kg 118.3 kg   SpO2: 97 % O2 Flow Rate (L/min): 2 L/min FiO2 (%): (S) 35 %  Examination: GENERAL: Obese gentleman, sitting up in bed, no respiratory distress, no conversational dyspnea.  Comfortable with nasal cannula O2 in place. HEAD: Normocephalic, atraumatic.  EYES: Pupils equal, round, reactive to light.  No scleral icterus.  MOUTH: Oral mucosa moist.  No thrush. NECK: Supple. No thyromegaly.  Trachea midline. No JVD.  No adenopathy. PULMONARY: Good air entry bilaterally.  Scattered wheezes throughout.  No crackles, no rhonchi. CARDIOVASCULAR: S1 and S2. Regular rate and rhythm.  No rubs, murmurs or gallops heard. ABDOMEN: Obese, protuberant, otherwise benign. MUSCULOSKELETAL: No  joint deformity, no clubbing, no edema.  NEUROLOGIC: No overt focal deficit.  Gait not tested.  Speech is fluent. SKIN: Intact,warm,dry. PSYCH: Anxious, occasional pressured speech.   Imaging reviewed  Chest x-ray performed 19 April 2022 showing cardiomegaly with chronic bronchial thickening minor bibasilar atelectasis, independently reviewed:   Assessment & Plan:  Acute respiratory failure with hypoxia COPD exacerbation vs. 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 Management per hospitalist team Taper systemic steroids as quickly as able  Active cocaine use Urine drug screen positive for cocaine This issue adds complexity to his management Cocaine use leads to multiple pulmonary effects 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  Leukocytosis, likely reactive To be related to infectious process Aggravated by steroids   Best Practice (right click and "Reselect all SmartList Selections" daily)   Diet/type: Regular consistency (see orders) DVT prophylaxis: LMWH GI prophylaxis: N/A Lines: N/A Foley:  N/A Code Status:  full code Last date of multidisciplinary goals of care discussion [N/A]  Labs   CBC: Recent Labs  Lab 04/19/22 1712 04/20/22 0607 04/21/22 0441  WBC 9.5 8.3 12.2*  NEUTROABS 6.5  --   --   HGB 14.8 15.0 14.2  HCT 45.7 45.4 43.4  MCV 90.7 89.2 90.0  PLT 196 211 202     Basic Metabolic Panel: Recent Labs  Lab 04/19/22 1605  04/20/22 0607 04/21/22 0441  NA 140 138 138  K 4.2 4.8 4.6  CL 105 107 106  CO2 _0 GLUCOSE 80 137* 105*  BUN 24* 36* 50*  CREATININE 1.73* 1.93* 2.20*  CALCIUM 9.4 9.6 8.8*    GFR: Estimated Creatinine Clearance: 48 mL/min (A) (by C-G formula based on SCr of 2.2 mg/dL (H)). Recent Labs  Lab 04/19/22 1712 04/20/22 0607 04/21/22 0441  WBC 9.5 8.3 12.2*     Liver Function Tests: Recent Labs  Lab 04/19/22 1605  AST 29  ALT 21  ALKPHOS 78  BILITOT 0.7  PROT 8.1  ALBUMIN 4.0    Coagulation Profile: No results for input(s): "INR", "PROTIME" in the last 168 hours.  Cardiac Enzymes: No results for input(s): "CKTOTAL", "CKMB", "CKMBINDEX", "TROPONINI" in the last 168 hours.  HbA1C: Hemoglobin A1C  Date/Time Value Ref Range Status  08/20/2021 10:08 AM 6.4 (A) 4.0 - 5.6 % Final   HbA1c, POC (prediabetic range)  Date/Time Value Ref Range Status  12/21/2021 11:41 AM 6.2 5.7 - 6.4 % Final   Hgb A1c MFr Bld  Date/Time Value Ref Range Status  04/20/2022 06:07 AM 6.2 (H) 4.8 - 5.6 % Final    Comment:    (NOTE) Pre diabetes:          5.7%-6.4%  Diabetes:              >6.4%  Glycemic control for   <7.0% adults with diabetes  02/09/2021 04:03 PM 6.9 (H) 4.8 - 5.6 % Final    Comment:    (NOTE) Pre diabetes:          5.7%-6.4%  Diabetes:              >6.4%  Glycemic control for   <7.0% adults with diabetes     CBG: Recent Labs  Lab 04/20/22 1351 04/20/22 1714 04/20/22 2109 04/21/22 0814 04/21/22 1209  GLUCAP 140* 116* 124* 122* 148*     Review of Systems:   A 10 point review of systems was performed and it is as noted above otherwise negative.  Past Medical History:  He,  has a past medical history of Arrhythmia, CHF (congestive heart failure) ([place]), Chronic kidney disease, COPD (chronic obstructive pulmonary disease) ([place]), Coronary artery disease, Depression, Diabetes mellitus without complication ([place]), GERD (gastroesophageal reflux  disease), Gout, Hypertension, Influenza A with respiratory manifestations, and Mental disorder.   Surgical History:   Past Surgical History:  Procedure Laterality Date   ANKLE SURGERY     CARDIAC CATHETERIZATION     CARDIOVERSION N/A 07/08/2020   Procedure: CARDIOVERSION;  Surgeon: Corey Skains, MD;  Location: [facility] ORS;  Service: Cardiovascular;  Laterality: N/A;   COLONOSCOPY WITH PROPOFOL N/A 11/19/2021   Procedure: COLONOSCOPY WITH PROPOFOL;  Surgeon: Daryel November, MD;  Location: [facility];  Service: Gastroenterology;  Laterality: N/A;   HERNIA REPAIR     x2   POLYPECTOMY  11/19/2021   Procedure: POLYPECTOMY;  Surgeon: Daryel November, MD;  Location: [facility];  Service: Gastroenterology;;   SHOULDER SURGERY     TEE WITHOUT CARDIOVERSION N/A 07/08/2020   Procedure: TRANSESOPHAGEAL ECHOCARDIOGRAM (TEE);  Surgeon: Corey Skains, MD;  Location: [facility] ORS;  Service: Cardiovascular;  Laterality: N/A;     Social History:   reports that he has been smoking cigarettes. He has a 43.00 pack-year smoking history. He has never used smokeless tobacco. He reports current drug use. Frequency: 21.00 times per week. Drugs: Marijuana and Cocaine. He reports that he does not drink alcohol.   Family History:  His family history includes Diabetes in his mother; HIV in his brother; Healthy in his daughter and son; Heart disease in his father.   Allergies No Known Allergies   Home Medications  Prior to Admission medications   Medication Sig Start Date End Date Taking? Authorizing Provider  allopurinol (ZYLOPRIM) 100 MG tablet TAKE 2 TABLETS (200 MG TOTAL) BY MOUTH DAILY. 12/30/21  Yes [name], MD  aspirin 81 MG EC tablet Take 1 tablet (81 mg total) by mouth daily. 11/30/20  Yes [name], MD  atorvastatin (LIPITOR) 40 MG tablet Take 1 tablet (40 mg total) by mouth daily. 02/12/22  Yes [name], MD  budesonide-formoterol [facility]) 160-4.5 MCG/ACT inhaler  Inhale 2 puffs into the lungs 2 (two) times daily. 02/09/22  Yes Dgayli, Berdine Addison, MD  carvedilol (COREG) 6.25 MG tablet Take 1 tablet (6.25 mg total) by mouth 2 (two) times daily. 12/01/20  Yes [name], FNP  dapagliflozin propanediol (FARXIGA) 10 MG TABS tablet Take 1 tablet (10 mg total) by mouth daily. 12/01/20  Yes [name], FNP  glipiZIDE (GLUCOTROL) 5 MG tablet Take 0.5 tablets (2.5 mg total) by mouth daily before breakfast. 08/20/21  Yes [name], MD  isosorbide-hydrALAZINE (BIDIL) 20-37.5 MG tablet Take 1 tablet by mouth 3 (three) times daily. 08/20/21  Yes [name], MD  ketorolac (ACULAR) 0.5 % ophthalmic solution Place  1 drop into the right eye 4 (four) times daily. 11/22/21  Yes Carrie Mew, MD  montelukast (SINGULAIR) 10 MG tablet TAKE 1 TABLET (10 MG TOTAL) BY MOUTH AT BEDTIME. 05/15/21 05/15/22 Yes [name], MD  Tiotropium Bromide Monohydrate (SPIRIVA RESPIMAT) 2.5 MCG/ACT AERS Inhale 2 puffs into the lungs daily. 02/09/22  Yes Dgayli, Berdine Addison, MD  torsemide (DEMADEX) 20 MG tablet Take 1 tablet (20 mg total) by mouth daily. 02/05/22  Yes [name], MD  Accu-Chek Softclix Lancets lancets Use as instructed 12/12/20   [name], MD  albuterol (VENTOLIN HFA) 108 (90 Base) MCG/ACT inhaler Inhale 2 puffs into the lungs every 6 (six) hours as needed for wheezing or shortness of breath. 05/21/21   [name], MD  Blood Glucose Monitoring Suppl (ACCU-CHEK GUIDE) w/Device KIT Use as directed 12/12/20   [name], MD  colchicine 0.6 MG tablet Take 2 tabs (1.2 mg) at the onset of a gout flare, may repeat 1 tab (0.6 mg) after 2 hours if symptoms persist. 02/12/22   [name], MD  glucose blood (ACCU-CHEK GUIDE) test strip Use as directed to check blood sugar 1-2 times a day 12/12/20   [name], MD  ipratropium-albuterol (DUONEB) 0.5-2.5 (3) MG/3ML SOLN Take 3 mLs by nebulization every 4 (four) hours as needed. 02/03/22  02/03/23  Armando Reichert, MD  polyethylene glycol powder (GLYCOLAX/MIRALAX) 17 GM/SCOOP powder Take 17 g by mouth daily as needed for mild constipation. 12/21/21   [name], MD  predniSONE (DELTASONE) 20 MG tablet Take 2 tablets (40 mg total) by mouth daily. Patient not taking: Reported on 04/19/2022 10/29/21   [name], PA-C  metoprolol tartrate (LOPRESSOR) 100 MG tablet Take 1 tablet (100 mg total) by mouth 2 (two) times daily. 07/09/20 07/11/20  [name], MD    Scheduled Meds:  arformoterol  15 mcg Nebulization BID   aspirin EC  81 mg Oral Daily   atorvastatin  40 mg Oral Daily   budesonide (PULMICORT) nebulizer solution  0.25 mg Nebulization BID   carvedilol  6.25 mg Oral BID WC   enoxaparin (LOVENOX) injection  0.5 mg/kg Subcutaneous Q24H   furosemide  40 mg Intravenous Q12H   guaiFENesin  1,200 mg Oral BID   insulin aspart  0-5 Units Subcutaneous QHS   insulin aspart  0-9 Units Subcutaneous TID WC   isosorbide-hydrALAZINE  1 tablet Oral TID   methylPREDNISolone (SOLU-MEDROL) injection  60 mg Intravenous Q12H   revefenacin  175 mcg Nebulization Daily   Continuous Infusions:  azithromycin 500 mg (04/21/22 0922)   PRN Meds:.acetaminophen **OR** acetaminophen, albuterol, magnesium hydroxide, morphine injection, ondansetron **OR** ondansetron (ZOFRAN) IV, mouth rinse, oxyCODONE-acetaminophen, traZODone   Level 2 follow-up    Discussed with Dr. Alonza Bogus, MD Advanced Bronchoscopy PCCM [facility]    *This note was dictated using voice recognition software/Dragon.  Despite best efforts to proofread, errors can occur which can change the meaning. Any transcriptional errors that result from this process are unintentional and may not be fully corrected at the time of dictation.

## 2022-04-21 NOTE — Progress Notes (Signed)
   Heart Failure Nurse Navigator Note  Attempted to meet with patient today, he was lying on his side stating he was having a lot of abdominal pain and asked if I would come back at another time when he was feeling better.  Pricilla Riffle RN CHFN

## 2022-04-21 NOTE — Progress Notes (Addendum)
Progress Note   Patient: Chris [name] DOB: [birth year] DOA: 04/19/2022     2 DOS: the patient was seen and examined on 04/21/2022   Brief hospital course: Chris [name] is a 61 y.o. male with medical history significant for CHF, COPD, coronary artery disease, depression, stage IIIb chronic kidney disease, type 2 diabetes mellitus, GERD, gout, hypertension and atrial flutter, who presented to the emergency room with acute onset of worsening shortness of breath over the last 4 days with associated cough as well as nausea and vomiting.  She admitted to associated chest pain as well as generalized weakness..  No fever or chills.  No diarrhea or abdominal pain.  No melena or bright red bleeding per rectum.  No bleeding diathesis.  She denies any dysuria, oliguria, urinary frequency or urgency or flank pain.   ED Course: Upon presentation to the emergency room, BP was 196/131 with respiratory rate of 26 and given tachypnea to 33 and respiratory distress he was placed on BiPAP with a pulse oximetry of 97% on 35% FiO2.  Labs revealed normal CBC.  BMP showed a creatinine of 1.73 with BUN of 24 compared to 43 and 2.26 on 02/03/2022.  BNP was 547.8 above previous levels in July.  High sensitive troponin was 54 and later 64. EKG as reviewed by me :   EKG showed normal sinus rhythm with fusion complexes, T wave inversion laterally, poor R wave progression and early repolarization in V3. Imaging: Two-view chest x-ray showed chronic cardiomegaly and chronic bronchial thickening with minor bibasilar atelectasis.   The patient was given 40 mg of IV Lasix, DuoNebs twice, 125 mg of IV Solu-Medrol and 500 mill IV normal saline.  He will be admitted to a progressive unit bed for further evaluation and management.  04/21/2022.  As of this morning patient on Solu-Medrol 60 mg IV twice daily and Lasix 40 mg IV twice daily.  With creatinine rising will change to oral Lasix 40 mg twice daily.  Patient was also  found to have positive cocaine in his system.  Assessment and Plan: * Acute respiratory failure with hypoxia ([place]) The patient initially required BiPAP secondary to respiratory distress.  Continue BiPAP at night.  Check room air pulse ox tomorrow morning with ambulation  COPD exacerbation (HCC) Continue Solu-Medrol 60 mg IV twice daily.  Case discussed with pulmonary.  Continue nebulizer treatments.  Acute on chronic diastolic CHF (congestive heart failure) ([place]) - Last 2D echo on 01/26/2022 revealed an EF of 50% with grade 1 diastolic dysfunction and mild to moderate left atrial dilatation. - Change IV Lasix over to oral Lasix twice daily dosing. - We will continue Farxiga, Coreg and BiDil.   Type 2 diabetes mellitus with stage 3 chronic kidney disease ([place]) - The patient will be placed on supplement coverage with NovoLog. -Restart Farxiga  Obesity (BMI 30-39.9) BMI 35.37.  Gout - We will Continue allopurinol  Dyslipidemia - We will continue statin therapy.  Cocaine abuse (HCC) Cocaine seen and urine toxicology.  Patient must stop this for his lung and heart issues to improve.        Subjective: Patient states he is not feeling good.  Complains of shortness of breath.  Coughing up some yellow-brownish phlegm.  Admitted with COPD exacerbation acute respiratory failure.  Patient states his abdomen is more swollen than usual.  Physical Exam: Vitals:   04/21/22 0500 04/21/22 0811 04/21/22 0812 04/21/22 1206  BP:  (!) 131/90  (!) 137/92  Pulse:  71 73 78  Resp:  '20 20 18  '$ Temp:  98.8 F (37.1 C)  97.7 F (36.5 C)  TempSrc:      SpO2:  98% 98% 97%  Weight: 118.3 kg     Height:       Physical Exam HENT:     Head: Normocephalic.     Mouth/Throat:     Pharynx: No oropharyngeal exudate.  Eyes:     General: Lids are normal.     Conjunctiva/sclera: Conjunctivae normal.  Cardiovascular:     Rate and Rhythm: Normal rate and regular rhythm.     Heart sounds: Normal  heart sounds, S1 normal and S2 normal.  Pulmonary:     Breath sounds: Examination of the right-middle field reveals wheezing. Examination of the left-middle field reveals wheezing. Examination of the right-lower field reveals decreased breath sounds and rhonchi. Examination of the left-lower field reveals decreased breath sounds and rhonchi. Decreased breath sounds, wheezing and rhonchi present. No rales.  Abdominal:     Palpations: Abdomen is soft.     Tenderness: There is no abdominal tenderness.  Musculoskeletal:     Right lower leg: Swelling present.     Left lower leg: Swelling present.  Skin:    General: Skin is warm.     Findings: No rash.  Neurological:     Mental Status: He is alert and oriented to person, place, and time.     Data Reviewed: Creatinine 2.2, BNP 547.8 down to 175.1, hemoglobin 14.2.  Urine toxicology positive for cocaine and opiates   Disposition: Status is: Inpatient Remains inpatient appropriate because: Still not feeling well.  Short of breath.  Being treated with IV Solu-Medrol for COPD exacerbation  Planned Discharge Destination: Home    Time spent: 28 minutes  Author: [name], MD 04/21/2022 2:15 PM  For on call review [email or web address].

## 2022-04-21 NOTE — Assessment & Plan Note (Addendum)
Cocaine seen and urine toxicology.

## 2022-04-22 ENCOUNTER — Other Ambulatory Visit: Payer: Self-pay

## 2022-04-22 ENCOUNTER — Other Ambulatory Visit (HOSPITAL_COMMUNITY): Payer: Self-pay

## 2022-04-22 ENCOUNTER — Inpatient Hospital Stay: Payer: Medicaid Other

## 2022-04-22 DIAGNOSIS — J441 Chronic obstructive pulmonary disease with (acute) exacerbation: Secondary | ICD-10-CM | POA: Diagnosis not present

## 2022-04-22 DIAGNOSIS — I5033 Acute on chronic diastolic (congestive) heart failure: Secondary | ICD-10-CM | POA: Diagnosis not present

## 2022-04-22 DIAGNOSIS — E669 Obesity, unspecified: Secondary | ICD-10-CM

## 2022-04-22 DIAGNOSIS — E1122 Type 2 diabetes mellitus with diabetic chronic kidney disease: Secondary | ICD-10-CM | POA: Diagnosis not present

## 2022-04-22 DIAGNOSIS — J121 Respiratory syncytial virus pneumonia: Secondary | ICD-10-CM

## 2022-04-22 DIAGNOSIS — J9601 Acute respiratory failure with hypoxia: Secondary | ICD-10-CM | POA: Diagnosis not present

## 2022-04-22 LAB — GLUCOSE, CAPILLARY
Glucose-Capillary: 138 mg/dL — ABNORMAL HIGH (ref 70–99)
Glucose-Capillary: 147 mg/dL — ABNORMAL HIGH (ref 70–99)
Glucose-Capillary: 201 mg/dL — ABNORMAL HIGH (ref 70–99)

## 2022-04-22 LAB — BASIC METABOLIC PANEL
Anion gap: 8 (ref 5–15)
BUN: 57 mg/dL — ABNORMAL HIGH (ref 6–20)
CO2: 26 mmol/L (ref 22–32)
Calcium: 9 mg/dL (ref 8.9–10.3)
Chloride: 103 mmol/L (ref 98–111)
Creatinine, Ser: 1.92 mg/dL — ABNORMAL HIGH (ref 0.61–1.24)
GFR, Estimated: 40 mL/min — ABNORMAL LOW (ref >60)
Glucose, Bld: 128 mg/dL — ABNORMAL HIGH (ref 70–99)
Potassium: 4.9 mmol/L (ref 3.5–5.1)
Sodium: 137 mmol/L (ref 135–145)

## 2022-04-22 MED ORDER — TRAZODONE HCL 50 MG PO TABS
50.0000 mg | ORAL_TABLET | Freq: Every evening | ORAL | Status: DC | PRN
  Administered 2022-04-22 – 2022-04-25 (×4): 50 mg via ORAL
  Filled 2022-04-22 (×4): qty 1
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
## 2022-04-22 NOTE — Progress Notes (Signed)
Abnormal kidney finding on renal ultrasound showing a solid area and could not rule out mass. MRI abdomen with and without contrast ordered.  A contrast is able to be used with those people that have chronic kidney disease.
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
## 2022-04-23 ENCOUNTER — Inpatient Hospital Stay: Payer: Medicaid Other

## 2022-04-23 ENCOUNTER — Other Ambulatory Visit: Payer: Self-pay

## 2022-04-23 DIAGNOSIS — N2889 Other specified disorders of kidney and ureter: Secondary | ICD-10-CM

## 2022-04-23 DIAGNOSIS — J121 Respiratory syncytial virus pneumonia: Secondary | ICD-10-CM | POA: Diagnosis not present

## 2022-04-23 DIAGNOSIS — J9601 Acute respiratory failure with hypoxia: Secondary | ICD-10-CM | POA: Diagnosis not present

## 2022-04-23 DIAGNOSIS — R451 Restlessness and agitation: Secondary | ICD-10-CM | POA: Diagnosis not present

## 2022-04-23 DIAGNOSIS — J441 Chronic obstructive pulmonary disease with (acute) exacerbation: Secondary | ICD-10-CM | POA: Diagnosis not present

## 2022-04-23 DIAGNOSIS — F1410 Cocaine abuse, uncomplicated: Secondary | ICD-10-CM | POA: Diagnosis not present

## 2022-04-23 DIAGNOSIS — I5033 Acute on chronic diastolic (congestive) heart failure: Secondary | ICD-10-CM | POA: Diagnosis not present

## 2022-04-23 LAB — GLUCOSE, CAPILLARY
Glucose-Capillary: 141 mg/dL — ABNORMAL HIGH (ref 70–99)
Glucose-Capillary: 132 mg/dL — ABNORMAL HIGH (ref 70–99)
Glucose-Capillary: 153 mg/dL — ABNORMAL HIGH (ref 70–99)
Glucose-Capillary: 124 mg/dL — ABNORMAL HIGH (ref 70–99)

## 2022-04-23 LAB — CULTURE, RESPIRATORY W GRAM STAIN
Culture: NORMAL
Gram Stain: NONE SEEN
Special Requests: NORMAL

## 2022-04-23 MED ORDER — THIAMINE MONONITRATE 100 MG PO TABS
100.0000 mg | ORAL_TABLET | Freq: Every day | ORAL | Status: DC
  Administered 2022-04-23 – 2022-04-26 (×4): 100 mg via ORAL
  Filled 2022-04-23 (×4): qty 1

## 2022-04-23 MED ORDER — LORAZEPAM 2 MG/ML IJ SOLN: 1.0000 mg | INTRAMUSCULAR | Status: DC | PRN

## 2022-04-23 MED ORDER — LORAZEPAM 1 MG PO TABS
1.0000 mg | ORAL_TABLET | ORAL | Status: DC | PRN
  Administered 2022-04-23 (×2): 2 mg via ORAL
  Filled 2022-04-23 (×2): qty 2

## 2022-04-23 MED ORDER — ALPRAZOLAM 0.25 MG PO TABS
0.2500 mg | ORAL_TABLET | Freq: Once | ORAL | Status: AC
  Administered 2022-04-23: 0.25 mg via ORAL
  Filled 2022-04-23: qty 1

## 2022-04-23 MED ORDER — RISAQUAD PO CAPS
2.0000 | ORAL_CAPSULE | Freq: Every day | ORAL | Status: DC
  Administered 2022-04-23 – 2022-04-26 (×4): 2 via ORAL
  Filled 2022-04-23 (×4): qty 2

## 2022-04-23 MED ORDER — THIAMINE HCL 100 MG/ML IJ SOLN: 100.0000 mg | Freq: Every day | INTRAMUSCULAR | Status: DC

## 2022-04-23 MED ORDER — GADOBUTROL 1 MMOL/ML IV SOLN
10.0000 mL | Freq: Once | INTRAVENOUS | Status: AC | PRN
  Administered 2022-04-23: 10 mL via INTRAVENOUS

## 2022-04-23 MED ORDER — FOLIC ACID 1 MG PO TABS
1.0000 mg | ORAL_TABLET | Freq: Every day | ORAL | Status: DC
  Administered 2022-04-23 – 2022-04-26 (×4): 1 mg via ORAL
  Filled 2022-04-23 (×4): qty 1

## 2022-04-23 MED ORDER — METHYLPREDNISOLONE SODIUM SUCC 125 MG IJ SOLR
80.0000 mg | INTRAMUSCULAR | Status: DC
  Administered 2022-04-23 – 2022-04-25 (×3): 80 mg via INTRAVENOUS
  Filled 2022-04-23 (×3): qty 2

## 2022-04-23 MED ORDER — ADULT MULTIVITAMIN W/MINERALS CH
1.0000 | ORAL_TABLET | Freq: Every day | ORAL | Status: DC
  Administered 2022-04-23 – 2022-04-26 (×4): 1 via ORAL
  Filled 2022-04-23 (×4): qty 1

## 2022-04-23 MED ORDER — ALPRAZOLAM 0.25 MG PO TABS
0.2500 mg | ORAL_TABLET | Freq: Three times a day (TID) | ORAL | Status: DC | PRN
  Administered 2022-04-23: 0.25 mg via ORAL
  Filled 2022-04-23: qty 1

## 2022-04-23 NOTE — Assessment & Plan Note (Signed)
Will refer to urology as an outpatient.  Needs to have better respiratory status prior to anything being done.  I explained this to the patient that this could be a cancerous process and must be followed up

## 2022-04-23 NOTE — Progress Notes (Signed)
PHARMACIST - PHYSICIAN COMMUNICATION   CONCERNING: Methylprednisolone IV    Current order: Methylprednisolone IV '40mg'$  q12h     DESCRIPTION: Per [facility] Protocol:   IV methylprednisolone will be converted to either a q12h or q24h frequency with the same total daily dose (TDD).  Ordered Dose: 1 to 125 mg TDD; convert to: TDD q24h.  Ordered Dose: 126 to 250 mg TDD; convert to: TDD div q12h.  Ordered Dose: >250 mg TDD; DAW.  Order has been adjusted to: Methylprednisolone IV '80mg'$  q24h   Noralee Space , PharmD Clinical Pharmacist  04/23/2022 2:20 PM

## 2022-04-23 NOTE — Assessment & Plan Note (Signed)
Seems improved today.  He does not drink alcohol.

## 2022-04-23 NOTE — Progress Notes (Signed)
Pt states, "I'm getting frustrated at my lack of progress. I feel like I'm getting angry and I dont' want to lash out at you staff." Asked pt what we could do to help him and pt states, "I can't think of anything but if I do, I'll let you know." Reminded pt that the Xanax I gave him an hour ago will help with that feeling of anxiety. Pt states he understands.  Pt being very flirtatious with my CNA. Asking her to turn on the lights so he can see her eyes, etc. Pt asked to stop being flirtatious.

## 2022-04-23 NOTE — Progress Notes (Addendum)
NAME:  Chris [name], MRN:  [id number], DOB:  [birth year], LOS: 4 ADMISSION DATE:  04/19/2022, CONSULTATION DATE: 04/20/2022 REFERRING MD: Marita Snellen CHIEF COMPLAINT: COPD exacerbation  History of Present Illness:  Chris [name] is a 61 year old current smoker, with a history of COPD (FEV1 unknown), HFrEF, LVH and past history of atrial flutter who presented to [facility] on 19 April 2022 after a 4-day history of increasing shortness of breath and a dry nonproductive cough, orthopnea, generalized malaise and nausea and vomiting.  He also had associated chest pain and generalized weakness.  Did not have any fevers or chills but felt "off".  No lower extremity edema.  He is not certain if he has had sick contacts in the home.  He does note that though he feels congested in his chest he cannot expectorate.  ED/hospital course: Upon presentation in the emergency room patient was noted to be hypertensive tachypneic and in respiratory distress.  He was placed on BiPAP with improvement on his tachypnea and work of breathing.  BNP was 547.8, chest x-ray was unrevealing except for chronic cardiomegaly and chronic bronchial thickening, patient's troponins were mildly elevated.  He was treated with Lasix, DuoNebs and IV Solu-Medrol.  He was admitted to progressive care unit where he is currently at.  The patient has noted little improvement on his symptoms since admission.  Stated he felt that the BiPAP was "smothering him" but at the same time wants to keep using it at nighttime.  Patient was admitted with the impression of COPD exacerbation and possible decompensation of known congestive heart failure.  Patient was recently evaluated by Dr. Genia Harold at [facility] he was scheduled to have PFTs but these have not been done as of yet.  At his visit with Dr. Genia Harold the patient was placed on triple therapy for management of his COPD with LAMA/ICS/LAMA (Symbicort/Spiriva).  PCCM has been consulted to  help with management of COPD exacerbation.  I have reviewed all available records and imaging.  Pertinent  Medical History  COPD, FEV1 unknown Diabetes mellitus HFrEF (combined systolic/diastolic) LVH Hypertension Tobacco dependence due to cigarettes Cocaine abuse  Significant Hospital Events: Including procedures, antibiotic start and stop dates in addition to other pertinent events   11/6: Admitted due to acute respiratory failure with hypoxia, COPD exacerbation versus decompensation of HFrEF 11/7: PCCM consulted for assistance with management of COPD exacerbation 11/8: Respiratory pathogens by PCR not done as requested, reordered 11/9: Patient tested positive for RSV, MRI of the abdomen reveals renal mass in the upper pole of right kidney concerning for renal neoplasm 11/10: Increased impulsivity, anxiety and appetite query cocaine withdrawal  Interim History / Subjective:  Frustrated by "lack of progress".  He is reclining in bed eating a bag of regular potato chips.  He is very hungry. Shortness of breath present but not worse than prior.  Objective   Blood pressure (!) 149/92, pulse 76, temperature 98.3 F (36.8 C), resp. rate 12, height 6' (1.829 m), weight 118 kg, SpO2 96 %.        Intake/Output Summary (Last 24 hours) at 04/23/2022 1417 Last data filed at 04/23/2022 0800 Gross per 24 hour  Intake --  Output 1075 ml  Net -1075 ml    Filed Weights   04/19/22 1900 04/21/22 0500 04/22/22 0349  Weight: 121.6 kg 118.3 kg 118 kg   SpO2: 96 % O2 Flow Rate (L/min): 2 L/min FiO2 (%): (S) 35 %  Examination: GENERAL: Obese gentleman, climbing in bed, no  respiratory distress, no conversational dyspnea.  Comfortable with nasal cannula O2 in place. HEAD: Normocephalic, atraumatic.  EYES: Pupils equal, round, reactive to light.  No scleral icterus.  MOUTH: Oral mucosa moist.  No thrush. NECK: Supple. No thyromegaly. Trachea midline. No JVD.  No adenopathy. PULMONARY: Good  air entry bilaterally.  Scattered wheezes throughout.  No crackles, no rhonchi. CARDIOVASCULAR: S1 and S2. Regular rate and rhythm.  No rubs, murmurs or gallops heard. ABDOMEN: Obese, protuberant, otherwise benign. MUSCULOSKELETAL: No joint deformity, no clubbing, no edema.  NEUROLOGIC: No overt focal deficit.  Gait not tested.  Speech is fluent. SKIN: Intact,warm,dry. PSYCH: Anxious, pressured speech, somewhat impulsive.   Imaging reviewed  Chest x-ray performed 19 April 2022 showing cardiomegaly with chronic bronchial thickening minor bibasilar atelectasis, independently reviewed:   Follow-up chest x-ray requested, pending.   Assessment & Plan:  Acute respiratory failure with hypoxia due to RSV infection COPD exacerbation +/- decompensation of combined systolic and diastolic heart failure Oxygen supplementation to keep saturations at 90% or better Bronchodilators: Candiss Norse and as needed albuterol Inhaled corticosteroids: Pulmicort Continue IV steroids with aim to taper quickly as possible Pulmonary hygiene, MetaNeb Respiratory panel PCR + for RSV Sputum for C&S in process Continue Lasix as tolerated Continue azithromycin Follow BNP BiPAP as needed  Diabetes mellitus with stage III chronic kidney disease Management per hospitalist team Taper systemic steroids as quickly as able  Active cocaine use Urine drug screen positive for cocaine This issue adds complexity to his management Cocaine use leads to multiple pulmonary effects 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  Leukocytosis, likely reactive To be related to infectious process Aggravated by steroids  Anxiety, impulsivity Query withdrawal from cocaine Patient exhibiting cocaine withdrawal symptoms such as agitation, restlessness and increased appetite Monitor closely, benzodiazepines as needed  Renal mass Patient will need follow-up with urology as an outpatient  Best  Practice (right click and "Reselect all SmartList Selections" daily)   Diet/type: Regular consistency (see orders) DVT prophylaxis: LMWH GI prophylaxis: N/A Lines: N/A Foley:  N/A Code Status:  full code Last date of multidisciplinary goals of care discussion [N/A]  Labs   CBC: Recent Labs  Lab 04/19/22 1712 04/20/22 0607 04/21/22 0441  WBC 9.5 8.3 12.2*  NEUTROABS 6.5  --   --   HGB 14.8 15.0 14.2  HCT 45.7 45.4 43.4  MCV 90.7 89.2 90.0  PLT 196 211 202     Basic Metabolic Panel: Recent Labs  Lab 04/19/22 1605 04/20/22 0607 04/21/22 0441 04/22/22 0533  NA 140 138 138 137  K 4.2 4.8 4.6 4.9  CL 105 107 106 103  CO2 _0 GLUCOSE 80 137* 105* 128*  BUN 24* 36* 50* 57*  CREATININE 1.73* 1.93* 2.20* 1.92*  CALCIUM 9.4 9.6 8.8* 9.0    GFR: Estimated Creatinine Clearance: 55 mL/min (A) (by C-G formula based on SCr of 1.92 mg/dL (H)). Recent Labs  Lab 04/19/22 1712 04/20/22 0607 04/21/22 0441  WBC 9.5 8.3 12.2*     Liver Function Tests: Recent Labs  Lab 04/19/22 1605  AST 29  ALT 21  ALKPHOS 78  BILITOT 0.7  PROT 8.1  ALBUMIN 4.0    Coagulation Profile: No results for input(s): "INR", "PROTIME" in the last 168 hours.  Cardiac Enzymes: No results for input(s): "CKTOTAL", "CKMB", "CKMBINDEX", "TROPONINI" in the last 168 hours.  HbA1C: Hemoglobin A1C  Date/Time Value Ref Range Status  08/20/2021 10:08 AM 6.4 (A) 4.0 -  5.6 % Final   HbA1c, POC (prediabetic range)  Date/Time Value Ref Range Status  12/21/2021 11:41 AM 6.2 5.7 - 6.4 % Final   Hgb A1c MFr Bld  Date/Time Value Ref Range Status  04/20/2022 06:07 AM 6.2 (H) 4.8 - 5.6 % Final    Comment:    (NOTE) Pre diabetes:          5.7%-6.4%  Diabetes:              >6.4%  Glycemic control for   <7.0% adults with diabetes   02/09/2021 04:03 PM 6.9 (H) 4.8 - 5.6 % Final    Comment:    (NOTE) Pre diabetes:          5.7%-6.4%  Diabetes:              >6.4%  Glycemic control for    <7.0% adults with diabetes     CBG: Recent Labs  Lab 04/22/22 0805 04/22/22 1237 04/22/22 1651 04/22/22 2133 04/23/22 0807  GLUCAP 138* 147* 141* 201* 132*     Review of Systems:   A 10 point review of systems was performed and it is as noted above otherwise negative.  Past Medical History:  He,  has a past medical history of Arrhythmia, CHF (congestive heart failure) ([place]), Chronic kidney disease, COPD (chronic obstructive pulmonary disease) ([place]), Coronary artery disease, Depression, Diabetes mellitus without complication ([place]), GERD (gastroesophageal reflux disease), Gout, Hypertension, Influenza A with respiratory manifestations, and Mental disorder.   Surgical History:   Past Surgical History:  Procedure Laterality Date   ANKLE SURGERY     CARDIAC CATHETERIZATION     CARDIOVERSION N/A 07/08/2020   Procedure: CARDIOVERSION;  Surgeon: Corey Skains, MD;  Location: [facility] ORS;  Service: Cardiovascular;  Laterality: N/A;   COLONOSCOPY WITH PROPOFOL N/A 11/19/2021   Procedure: COLONOSCOPY WITH PROPOFOL;  Surgeon: Daryel November, MD;  Location: [facility] ENDOSCOPY;  Service: Gastroenterology;  Laterality: N/A;   HERNIA REPAIR     x2   POLYPECTOMY  11/19/2021   Procedure: POLYPECTOMY;  Surgeon: Daryel November, MD;  Location: [facility] ENDOSCOPY;  Service: Gastroenterology;;   SHOULDER SURGERY     TEE WITHOUT CARDIOVERSION N/A 07/08/2020   Procedure: TRANSESOPHAGEAL ECHOCARDIOGRAM (TEE);  Surgeon: Corey Skains, MD;  Location: [facility] ORS;  Service: Cardiovascular;  Laterality: N/A;     Social History:   reports that he has been smoking cigarettes. He has a 43.00 pack-year smoking history. He has never used smokeless tobacco. He reports current drug use. Frequency: 21.00 times per week. Drugs: Marijuana and Cocaine. He reports that he does not drink alcohol.   Family History:  His family history includes Diabetes in his mother; HIV in his brother; Healthy in his daughter and  son; Heart disease in his father.   Allergies No Known Allergies   Home Medications  Prior to Admission medications   Medication Sig Start Date End Date Taking? Authorizing Provider  allopurinol (ZYLOPRIM) 100 MG tablet TAKE 2 TABLETS (200 MG TOTAL) BY MOUTH DAILY. 12/30/21  Yes [name], MD  aspirin 81 MG EC tablet Take 1 tablet (81 mg total) by mouth daily. 11/30/20  Yes [name], MD  atorvastatin (LIPITOR) 40 MG tablet Take 1 tablet (40 mg total) by mouth daily. 02/12/22  Yes [name], MD  budesonide-formoterol [facility]) 160-4.5 MCG/ACT inhaler Inhale 2 puffs into the lungs 2 (two) times daily. 02/09/22  Yes Dgayli, Berdine Addison, MD  carvedilol (COREG) 6.25 MG tablet Take  1 tablet (6.25 mg total) by mouth 2 (two) times daily. 12/01/20  Yes [name], FNP  dapagliflozin propanediol (FARXIGA) 10 MG TABS tablet Take 1 tablet (10 mg total) by mouth daily. 12/01/20  Yes [name], FNP  glipiZIDE (GLUCOTROL) 5 MG tablet Take 0.5 tablets (2.5 mg total) by mouth daily before breakfast. 08/20/21  Yes [name], MD  isosorbide-hydrALAZINE (BIDIL) 20-37.5 MG tablet Take 1 tablet by mouth 3 (three) times daily. 08/20/21  Yes [name], MD  ketorolac (ACULAR) 0.5 % ophthalmic solution Place 1 drop into the right eye 4 (four) times daily. 11/22/21  Yes Carrie Mew, MD  montelukast (SINGULAIR) 10 MG tablet TAKE 1 TABLET (10 MG TOTAL) BY MOUTH AT BEDTIME. 05/15/21 05/15/22 Yes [name], MD  Tiotropium Bromide Monohydrate (SPIRIVA RESPIMAT) 2.5 MCG/ACT AERS Inhale 2 puffs into the lungs daily. 02/09/22  Yes Dgayli, Berdine Addison, MD  torsemide (DEMADEX) 20 MG tablet Take 1 tablet (20 mg total) by mouth daily. 02/05/22  Yes [name], MD  Accu-Chek Softclix Lancets lancets Use as instructed 12/12/20   [name], MD  albuterol (VENTOLIN HFA) 108 (90 Base) MCG/ACT inhaler Inhale 2 puffs into the lungs every 6 (six) hours as needed for wheezing or  shortness of breath. 05/21/21   [name], MD  Blood Glucose Monitoring Suppl (ACCU-CHEK GUIDE) w/Device KIT Use as directed 12/12/20   [name], MD  colchicine 0.6 MG tablet Take 2 tabs (1.2 mg) at the onset of a gout flare, may repeat 1 tab (0.6 mg) after 2 hours if symptoms persist. 02/12/22   [name], MD  glucose blood (ACCU-CHEK GUIDE) test strip Use as directed to check blood sugar 1-2 times a day 12/12/20   [name], MD  ipratropium-albuterol (DUONEB) 0.5-2.5 (3) MG/3ML SOLN Take 3 mLs by nebulization every 4 (four) hours as needed. 02/03/22 02/03/23  Armando Reichert, MD  polyethylene glycol powder (GLYCOLAX/MIRALAX) 17 GM/SCOOP powder Take 17 g by mouth daily as needed for mild constipation. 12/21/21   [name], MD  predniSONE (DELTASONE) 20 MG tablet Take 2 tablets (40 mg total) by mouth daily. Patient not taking: Reported on 04/19/2022 10/29/21   [name], PA-C  metoprolol tartrate (LOPRESSOR) 100 MG tablet Take 1 tablet (100 mg total) by mouth 2 (two) times daily. 07/09/20 07/11/20  [name], MD    Scheduled Meds:  acidophilus  2 capsule Oral Daily   ALPRAZolam  0.25 mg Oral Once   arformoterol  15 mcg Nebulization BID   aspirin EC  81 mg Oral Daily   atorvastatin  40 mg Oral Daily   budesonide (PULMICORT) nebulizer solution  0.25 mg Nebulization BID   carvedilol  6.25 mg Oral BID WC   dapagliflozin propanediol  10 mg Oral Daily   enoxaparin (LOVENOX) injection  0.5 mg/kg Subcutaneous Q24H   furosemide  40 mg Oral BID   guaiFENesin  1,200 mg Oral BID   insulin aspart  0-5 Units Subcutaneous QHS   insulin aspart  0-9 Units Subcutaneous TID WC   isosorbide-hydrALAZINE  1 tablet Oral TID   methylPREDNISolone (SOLU-MEDROL) injection  60 mg Intravenous Q12H   revefenacin  175 mcg Nebulization Daily   Continuous Infusions:  azithromycin 500 mg (04/23/22 1005)   PRN Meds:.acetaminophen **OR** acetaminophen, albuterol, ALPRAZolam,  magnesium hydroxide, morphine injection, ondansetron **OR** ondansetron (ZOFRAN) IV, mouth rinse, oxyCODONE-acetaminophen, traZODone   Level 2 follow-up    Discussed with Dr. Leslye Peer.  Patient will be placed on withdrawal protocol.  I have discussed also with the SDU/ICU team in the event patient develops worsening withdrawal symptoms.  We will see again on Monday 13 November however, may call with any questions/concerns.  Renold Don, MD Advanced Bronchoscopy PCCM [facility]    *This note was dictated using voice recognition software/Dragon.  Despite best efforts to proofread, errors can occur which can change the meaning. Any transcriptional errors that result from this process are unintentional and may not be fully corrected at the time of dictation.

## 2022-04-23 NOTE — Progress Notes (Signed)
CALLED TO ROOM PER RN, RN FOUND PT LEANING OVER THE SINK AND SOB. PT PLACED ON BIPAP AT THIS TIME.

## 2022-04-23 NOTE — Consult Note (Signed)
   Heart Failure Nurse Navigator Note  HFmrEF 2%.  Moderate LVH with severe septal hypertrophy.  1 diastolic dysfunction.  He presented to the emergency room with complaints of a 4-day history of worsening shortness of breath, cough and nausea and vomiting.  BNP 175.   Comorbidities:  COPD Coronary artery disease Depression Chronic kidney disease stage III Type 2 diabetes GERD Gout Hypertension Atrial flutter  Medications:  Aspirin 81 mg daily Atorvastatin 40 mg daily Carvedilol 6.25 mg 2 days with meals Farxiga 10 mg daily Furosemide 40 mg orally 2 times a day BiDil/37.51 tablet 3 times a day  Labs:  Sodium 137, potassium 4.9, chloride 103, CO2 26, BUN 57, creatinine 1.92, estimated GFR 40  Weight is documented Blood pressure 149/92 Intake not documented Output 1375 mL    Initial meeting with patient who was sitting up on the edge of the bed, currently wearing BiPAP.  States that he lives at home by himself.  Discussed  low-sodium diet.  He does not use salt at the table.  This is fresh or frozen vegetables, eats fruits as far as meats he does chicken and beef and he fixes on the grill or he bakes.  Discussed fluid restriction and what he drinks.  Patient states that he drinks 3 L bottle of soda.  Recommend that he decrease that intake by 1 L.  He does not drink anything else besides the soda and states that he will back on his intake.  He has a scale but does not weigh him self on a daily basis.  Went over the reasoning behind daily weights and what to report.  He has follow-up in the outpatient heart failure clinic on November 22 at 4 PM.  He has a 35% no-show which is 72 out of 208 appointments.  He voices concern over the abnormality that was found on the ultrasound and an MRI yesterday.  He had no further questions at this time and I told him if he was still here on Monday that I would stop by.  Pricilla Riffle RN [facility]

## 2022-04-23 NOTE — Progress Notes (Signed)
Progress Note   Patient: Chris [name] NOB:[id number] DOB: May 16, 1961 DOA: 04/19/2022     4 DOS: the patient was seen and examined on 04/23/2022   Brief hospital course: Chris [name] is a 61 y.o. male with medical history significant for CHF, COPD, coronary artery disease, depression, stage IIIb chronic kidney disease, type 2 diabetes mellitus, GERD, gout, hypertension and atrial flutter, who presented to the emergency room with acute onset of worsening shortness of breath over the last 4 days with associated cough as well as nausea and vomiting.  She admitted to associated chest pain as well as generalized weakness..  No fever or chills.  No diarrhea or abdominal pain.  No melena or bright red bleeding per rectum.  No bleeding diathesis.  She denies any dysuria, oliguria, urinary frequency or urgency or flank pain.   ED Course: Upon presentation to the emergency room, BP was 196/131 with respiratory rate of 26 and given tachypnea to 33 and respiratory distress he was placed on BiPAP with a pulse oximetry of 97% on 35% FiO2.  Labs revealed normal CBC.  BMP showed a creatinine of 1.73 with BUN of 24 compared to 43 and 2.26 on 02/03/2022.  BNP was 547.8 above previous levels in July.  High sensitive troponin was 54 and later 64. EKG as reviewed by me :   EKG showed normal sinus rhythm with fusion complexes, T wave inversion laterally, poor R wave progression and early repolarization in V3. Imaging: Two-view chest x-ray showed chronic cardiomegaly and chronic bronchial thickening with minor bibasilar atelectasis.   The patient was given 40 mg of IV Lasix, DuoNebs twice, 125 mg of IV Solu-Medrol and 500 mill IV normal saline.  He will be admitted to a progressive unit bed for further evaluation and management.  04/21/2022.  As of this morning patient on Solu-Medrol 60 mg IV twice daily and Lasix 40 mg IV twice daily.  With creatinine rising will change to oral Lasix 40 mg twice daily.  Patient was also  found to have positive cocaine in his system.  04/22/2022.  Patient still with a lot of bronchospasm requiring IV Solu-Medrol.  RSV test positive.  11/10.  Patient becoming more agitated during the day.  We will place empirically on alcohol withdrawal protocol.  Assessment and Plan: * Acute respiratory failure with hypoxia (Chris [name]) The patient initially required BiPAP secondary to respiratory distress.  Continue BiPAP at night.  Continue to try to taper off oxygen.  COPD exacerbation (Chris [name]) RSV infection.  Continue Solu-Medrol 40 mg IV twice daily.  Case discussed with pulmonary.  Continue nebulizer treatments.  Agitation Not sure what he is withdrawing from could be numerous substances but will put on alcohol withdrawal protocol just in case.  Acute on chronic diastolic CHF (congestive heart failure) (HCC) - Last 2D echo on 01/26/2022 revealed an EF of 50% with grade 1 diastolic dysfunction and mild to moderate left atrial dilatation. - oral Lasix twice daily dosing. - We will continue Farxiga, Coreg and BiDil.   Type 2 diabetes mellitus with stage 3 chronic kidney disease ([place]) - The patient will be placed on supplement coverage with NovoLog. -Continue Farxiga  Right kidney mass Will refer to urology as an outpatient.  Needs to have better respiratory status prior to anything being done.  I explained this to the patient that this could be a cancerous process and must be followed up  Obesity (BMI 30-39.9) BMI 35.28.  Gout - We will Continue allopurinol  Dyslipidemia -  We will continue statin therapy.  Cocaine abuse (HCC) Cocaine seen and urine toxicology.  Patient must stop this for his lung and heart issues to improve.        Subjective: Patient states he is not feeling well.  Still complains of shortness of breath and wheeze.  Seen this morning while receiving a breathing treatment.  Becoming more agitated during the day.  Physical Exam: Vitals:   04/22/22 2348 04/23/22  0455 04/23/22 0805 04/23/22 1226  BP: (!) 143/95 135/82 (!) 149/92   Pulse: 76 66 76   Resp: '18 18 12   '$ Temp: 97.7 F (36.5 C)  98.3 F (36.8 C)   TempSrc:      SpO2: 97% 98% 96% 96%  Weight:      Height:       Physical Exam HENT:     Head: Normocephalic.     Mouth/Throat:     Pharynx: No oropharyngeal exudate.  Eyes:     General: Lids are normal.     Conjunctiva/sclera: Conjunctivae normal.  Cardiovascular:     Rate and Rhythm: Normal rate and regular rhythm.     Heart sounds: Normal heart sounds, S1 normal and S2 normal.  Pulmonary:     Breath sounds: Examination of the right-lower field reveals decreased breath sounds and wheezing. Examination of the left-lower field reveals decreased breath sounds and wheezing. Decreased breath sounds and wheezing present. No rhonchi or rales.  Abdominal:     Palpations: Abdomen is soft.     Tenderness: There is no abdominal tenderness.     Comments: Left flank area in the area of hardness.  Musculoskeletal:     Right lower leg: Swelling present.     Left lower leg: Swelling present.  Skin:    General: Skin is warm.     Findings: No rash.  Neurological:     Mental Status: He is alert and oriented to person, place, and time.     Data Reviewed: Kidney mass seen on MRI.  Disposition: Status is: Inpatient Remains inpatient appropriate because: Still has wheezing in the lungs requiring IV Solu-Medrol.  Planned Discharge Destination: Home    Time spent: 28 minutes  Author: [name], MD 04/23/2022 2:58 PM  For on call review [email or web address].

## 2022-04-24 DIAGNOSIS — I5033 Acute on chronic diastolic (congestive) heart failure: Secondary | ICD-10-CM | POA: Diagnosis not present

## 2022-04-24 DIAGNOSIS — J441 Chronic obstructive pulmonary disease with (acute) exacerbation: Secondary | ICD-10-CM | POA: Diagnosis not present

## 2022-04-24 DIAGNOSIS — J9601 Acute respiratory failure with hypoxia: Secondary | ICD-10-CM | POA: Diagnosis not present

## 2022-04-24 DIAGNOSIS — R451 Restlessness and agitation: Secondary | ICD-10-CM | POA: Diagnosis not present

## 2022-04-24 LAB — BASIC METABOLIC PANEL
Anion gap: 3 — ABNORMAL LOW (ref 5–15)
BUN: 51 mg/dL — ABNORMAL HIGH (ref 6–20)
CO2: 28 mmol/L (ref 22–32)
Calcium: 8.9 mg/dL (ref 8.9–10.3)
Chloride: 107 mmol/L (ref 98–111)
Creatinine, Ser: 1.93 mg/dL — ABNORMAL HIGH (ref 0.61–1.24)
GFR, Estimated: 39 mL/min — ABNORMAL LOW (ref >60)
Glucose, Bld: 126 mg/dL — ABNORMAL HIGH (ref 70–99)
Potassium: 4.9 mmol/L (ref 3.5–5.1)
Sodium: 138 mmol/L (ref 135–145)

## 2022-04-24 LAB — GLUCOSE, CAPILLARY
Glucose-Capillary: 116 mg/dL — ABNORMAL HIGH (ref 70–99)
Glucose-Capillary: 164 mg/dL — ABNORMAL HIGH (ref 70–99)
Glucose-Capillary: 125 mg/dL — ABNORMAL HIGH (ref 70–99)
Glucose-Capillary: 147 mg/dL — ABNORMAL HIGH (ref 70–99)

## 2022-04-24 MED ORDER — HYDROCOD POLI-CHLORPHE POLI ER 10-8 MG/5ML PO SUER
5.0000 mL | Freq: Two times a day (BID) | ORAL | Status: DC | PRN
  Administered 2022-04-25: 5 mL via ORAL
  Filled 2022-04-24: qty 5

## 2022-04-24 NOTE — Progress Notes (Signed)
Progress Note   Patient: Chris [name] VFI:[id number] DOB: 04/05/1961 DOA: 04/19/2022     5 DOS: the patient was seen and examined on 04/24/2022   Brief hospital course: DECKER COGDELL is a 61 y.o. male with medical history significant for CHF, COPD, coronary artery disease, depression, stage IIIb chronic kidney disease, type 2 diabetes mellitus, GERD, gout, hypertension and atrial flutter, who presented to the emergency room with acute onset of worsening shortness of breath over the last 4 days with associated cough as well as nausea and vomiting.  She admitted to associated chest pain as well as generalized weakness..  No fever or chills.  No diarrhea or abdominal pain.  No melena or bright red bleeding per rectum.  No bleeding diathesis.  She denies any dysuria, oliguria, urinary frequency or urgency or flank pain.   ED Course: Upon presentation to the emergency room, BP was 196/131 with respiratory rate of 26 and given tachypnea to 33 and respiratory distress he was placed on BiPAP with a pulse oximetry of 97% on 35% FiO2.  Labs revealed normal CBC.  BMP showed a creatinine of 1.73 with BUN of 24 compared to 43 and 2.26 on 02/03/2022.  BNP was 547.8 above previous levels in July.  High sensitive troponin was 54 and later 64. EKG as reviewed by me :   EKG showed normal sinus rhythm with fusion complexes, T wave inversion laterally, poor R wave progression and early repolarization in V3. Imaging: Two-view chest x-ray showed chronic cardiomegaly and chronic bronchial thickening with minor bibasilar atelectasis.   The patient was given 40 mg of IV Lasix, DuoNebs twice, 125 mg of IV Solu-Medrol and 500 mill IV normal saline.  He will be admitted to a progressive unit bed for further evaluation and management.  04/21/2022.  As of this morning patient on Solu-Medrol 60 mg IV twice daily and Lasix 40 mg IV twice daily.  With creatinine rising will change to oral Lasix 40 mg twice daily.  Patient was also  found to have positive cocaine in his system.  04/22/2022.  Patient still with a lot of bronchospasm requiring IV Solu-Medrol.  RSV test positive.  11/10.  Patient becoming more agitated during the day.  We will place empirically on alcohol withdrawal protocol.  11/11.  Patient denies drinking any alcohol.  Not agitated today.  Feels okay.  Advised slow deep breaths.  Continue incentive spirometer  Assessment and Plan: * Acute respiratory failure with hypoxia (HCC) The patient initially required BiPAP secondary to respiratory distress.  Continue BiPAP at night.  Taper oxygen if able.  COPD exacerbation ([place]) RSV infection.  Continue Solu-Medrol 40 mg IV twice daily.   Continue nebulizer treatments.  Patient still with a lot of bronchospasm.  Added Tussionex.  Agitation Seems improved today.  He does not drink alcohol.  Acute on chronic diastolic CHF (congestive heart failure) (HCC) - Last 2D echo on 01/26/2022 revealed an EF of 50% with grade 1 diastolic dysfunction and mild to moderate left atrial dilatation. - oral Lasix twice daily dosing. - We will continue Farxiga, Coreg and BiDil.   Type 2 diabetes mellitus with stage 3 chronic kidney disease ([place]) - The patient will be placed on supplement coverage with NovoLog. -Continue Farxiga  Right kidney mass Will refer to urology as an outpatient.  Needs to have better respiratory status prior to anything being done.  I explained this to the patient that this could be a cancerous process and must be followed  up  Obesity (BMI 30-39.9) BMI 35.28.  Gout Continue allopurinol  Dyslipidemia Continue statin therapy.  Cocaine abuse (HCC) Cocaine seen and urine toxicology.  Patient must stop this for his lung and heart issues to improve.        Subjective: Patient still not breathing well.  Continues to have shortness of breath.  Still has cough and wheeze.  Physical Exam: Vitals:   04/24/22 0404 04/24/22 0840 04/24/22 0900  04/24/22 1241  BP: (!) 148/97 (!) 155/85  127/67  Pulse: 67 (!) 59  65  Resp:  18  20  Temp:  97.8 F (36.6 C)  97.9 F (36.6 C)  TempSrc:      SpO2: 99% 100% 97% 100%  Weight:      Height:       Physical Exam HENT:     Head: Normocephalic.     Mouth/Throat:     Pharynx: No oropharyngeal exudate.  Eyes:     General: Lids are normal.     Conjunctiva/sclera: Conjunctivae normal.  Cardiovascular:     Rate and Rhythm: Normal rate and regular rhythm.     Heart sounds: Normal heart sounds, S1 normal and S2 normal.  Pulmonary:     Breath sounds: Examination of the right-middle field reveals wheezing. Examination of the left-middle field reveals wheezing. Examination of the right-lower field reveals decreased breath sounds and wheezing. Examination of the left-lower field reveals decreased breath sounds and wheezing. Decreased breath sounds and wheezing present. No rhonchi or rales.  Abdominal:     Palpations: Abdomen is soft.     Tenderness: There is no abdominal tenderness.     Comments: Left flank area in the area of hardness.  Musculoskeletal:     Right lower leg: Swelling present.     Left lower leg: Swelling present.  Skin:    General: Skin is warm.     Findings: No rash.  Neurological:     Mental Status: He is alert and oriented to person, place, and time.     Data Reviewed: Creatinine 1.93 Chest x-ray shows enlargement of cardiac silhouette with pulmonary vascular congestion and mild bronchitic changes with improved bibasilar atelectasis  Disposition: Status is: Inpatient Remains inpatient appropriate because: Patient still having a lot of bronchospasm requiring IV Solu-Medrol  Planned Discharge Destination: Home    Time spent: 27 minutes  Author: [name], MD 04/24/2022 1:04 PM  For on call review [email or web address].

## 2022-04-25 DIAGNOSIS — I5033 Acute on chronic diastolic (congestive) heart failure: Secondary | ICD-10-CM | POA: Diagnosis not present

## 2022-04-25 DIAGNOSIS — J441 Chronic obstructive pulmonary disease with (acute) exacerbation: Secondary | ICD-10-CM | POA: Diagnosis not present

## 2022-04-25 DIAGNOSIS — R451 Restlessness and agitation: Secondary | ICD-10-CM | POA: Diagnosis not present

## 2022-04-25 DIAGNOSIS — J9601 Acute respiratory failure with hypoxia: Secondary | ICD-10-CM | POA: Diagnosis not present

## 2022-04-25 LAB — GLUCOSE, CAPILLARY
Glucose-Capillary: 90 mg/dL (ref 70–99)
Glucose-Capillary: 100 mg/dL — ABNORMAL HIGH (ref 70–99)
Glucose-Capillary: 108 mg/dL — ABNORMAL HIGH (ref 70–99)
Glucose-Capillary: 169 mg/dL — ABNORMAL HIGH (ref 70–99)

## 2022-04-25 NOTE — TOC Progression Note (Signed)
Transition of Care [facility]) - Progression Note    Patient Details  Name: PHILOPATEER STRINE MRN: [id number] Date of Birth: January 30, 1961  [facility] CM/SW Contact  Valente David, RN Phone Number: 04/25/2022, 9:49 AM  Clinical Narrative:     Spoke with patient, agrees to receive substance abuse resources for alcohol abuse.  Education delivered to bedside.  Patient also report concern about ability to pay utility bills, state he was told he may go home on oxygen.  Advised that utility resources are attached to AVS.   Expected Discharge Plan: Home/Self Care Barriers to Discharge: Continued Medical Work up  Expected Discharge Plan and Services Expected Discharge Plan: Home/Self Care       Living arrangements for the past 2 months: Single Family Home                                       Social Determinants of Health (SDOH) Interventions Transportation Interventions: Patient Resources (Friends/Family)  Readmission Risk Interventions    10/29/2019   11:07 AM  Readmission Risk Prevention Plan  Transportation Screening Complete  PCP or Specialist Appt within 3-5 Days Complete  HRI or [place] Complete  Social Work Consult for [place] Planning/Counseling Complete  Palliative Care Screening Not Applicable  Medication Review [employer]) Complete

## 2022-04-25 NOTE — Progress Notes (Signed)
Progress Note   Patient: Chris [name]:[id number] DOB: 07-08-1960 DOA: 04/19/2022     6 DOS: the patient was seen and examined on 04/25/2022   Brief hospital course: Chris [name] is a 61 y.o. male with medical history significant for CHF, COPD, coronary artery disease, depression, stage IIIb chronic kidney disease, type 2 diabetes mellitus, GERD, gout, hypertension and atrial flutter, who presented to the emergency room with acute onset of worsening shortness of breath over the last 4 days with associated cough as well as nausea and vomiting.  She admitted to associated chest pain as well as generalized weakness..  No fever or chills.  No diarrhea or abdominal pain.  No melena or bright red bleeding per rectum.  No bleeding diathesis.  She denies any dysuria, oliguria, urinary frequency or urgency or flank pain.   ED Course: Upon presentation to the emergency room, BP was 196/131 with respiratory rate of 26 and given tachypnea to 33 and respiratory distress he was placed on BiPAP with a pulse oximetry of 97% on 35% FiO2.  Labs revealed normal CBC.  BMP showed a creatinine of 1.73 with BUN of 24 compared to 43 and 2.26 on 02/03/2022.  BNP was 547.8 above previous levels in July.  High sensitive troponin was 54 and later 64. EKG as reviewed by me :   EKG showed normal sinus rhythm with fusion complexes, T wave inversion laterally, poor R wave progression and early repolarization in V3. Imaging: Two-view chest x-ray showed chronic cardiomegaly and chronic bronchial thickening with minor bibasilar atelectasis.   The patient was given 40 mg of IV Lasix, DuoNebs twice, 125 mg of IV Solu-Medrol and 500 mill IV normal saline.  He will be admitted to a progressive unit bed for further evaluation and management.  04/21/2022.  As of this morning patient on Solu-Medrol 60 mg IV twice daily and Lasix 40 mg IV twice daily.  With creatinine rising will change to oral Lasix 40 mg twice daily.  Patient was also  found to have positive cocaine in his system.  04/22/2022.  Patient still with a lot of bronchospasm requiring IV Solu-Medrol.  RSV test positive.  11/10.  Patient becoming more agitated during the day.  We will place empirically on alcohol withdrawal protocol.  11/11.  Patient denies drinking any alcohol.  Not agitated today.  Advised slow deep breaths.  Continue incentive spirometer  11/12.  Patient still with a lot of bronchospasm.  Still on 3 L of oxygen  Assessment and Plan: * Acute respiratory failure with hypoxia (HCC) The patient initially required BiPAP secondary to respiratory distress.  Continue BiPAP at night.  Taper oxygen if able.  Check room air pulse ox in the morning.  COPD exacerbation ([place]) RSV infection.  Continue Solu-Medrol 40 mg IV twice daily.   Continue nebulizer treatments.  Patient still with a lot of bronchospasm.  Added Tussionex.  Agitation Seems improved today.  He does not drink alcohol.  Acute on chronic diastolic CHF (congestive heart failure) (HCC) - Last 2D echo on 01/26/2022 revealed an EF of 50% with grade 1 diastolic dysfunction and mild to moderate left atrial dilatation. - oral Lasix twice daily dosing. - We will continue Farxiga, Coreg and BiDil.   Type 2 diabetes mellitus with stage 3 chronic kidney disease ([place]) - The patient will be placed on supplement coverage with NovoLog. -Continue Farxiga  Right kidney mass Will refer to urology as an outpatient.  Needs to have better respiratory status  prior to anything being done.  I explained this to the patient that this could be a cancerous process and must be followed up  Obesity (BMI 30-39.9) BMI 35.28.  Gout Continue allopurinol  Dyslipidemia Continue statin therapy.  Cocaine abuse (HCC) Cocaine seen and urine toxicology.         Subjective: Patient still with shortness of breath cough and wheeze.  Having some diarrhea but does not feel upcoming.  Still not feeling well.   Admitted with COPD exacerbation & RSV infection.  Physical Exam: Vitals:   04/24/22 2310 04/25/22 0040 04/25/22 0431 04/25/22 0838  BP:  128/83 (!) 144/89 (!) 158/96  Pulse:  68 70 66  Resp:  '19 20 18  '$ Temp:  (!) 97.4 F (36.3 C) (!) 97.4 F (36.3 C)   TempSrc:      SpO2: 97% 100% 100% 100%  Weight:      Height:       Physical Exam HENT:     Head: Normocephalic.     Mouth/Throat:     Pharynx: No oropharyngeal exudate.  Eyes:     General: Lids are normal.     Conjunctiva/sclera: Conjunctivae normal.  Cardiovascular:     Rate and Rhythm: Normal rate and regular rhythm.     Heart sounds: Normal heart sounds, S1 normal and S2 normal.  Pulmonary:     Breath sounds: Examination of the right-middle field reveals wheezing. Examination of the left-middle field reveals wheezing. Examination of the right-lower field reveals decreased breath sounds and wheezing. Examination of the left-lower field reveals decreased breath sounds and wheezing. Decreased breath sounds and wheezing present. No rhonchi or rales.  Abdominal:     Palpations: Abdomen is soft.     Tenderness: There is no abdominal tenderness.     Comments: Left flank area in the area of hardness.  Musculoskeletal:     Right lower leg: Swelling present.     Left lower leg: Swelling present.  Skin:    General: Skin is warm.     Findings: No rash.  Neurological:     Mental Status: He is alert and oriented to person, place, and time.     Data Reviewed: Last four sugars 125, 147, 98 and 100  Family Communication:   Disposition: Status is: Inpatient Remains inpatient appropriate because: Still with too much bronchospasm to go home.  Planned Discharge Destination: Home    Time spent: 27 minutes  Author: [name], MD 04/25/2022 1:01 PM  For on call review [email or web address].

## 2022-04-26 ENCOUNTER — Other Ambulatory Visit: Payer: Self-pay

## 2022-04-26 DIAGNOSIS — J441 Chronic obstructive pulmonary disease with (acute) exacerbation: Secondary | ICD-10-CM | POA: Diagnosis not present

## 2022-04-26 DIAGNOSIS — R451 Restlessness and agitation: Secondary | ICD-10-CM | POA: Diagnosis not present

## 2022-04-26 DIAGNOSIS — I5033 Acute on chronic diastolic (congestive) heart failure: Secondary | ICD-10-CM | POA: Diagnosis not present

## 2022-04-26 DIAGNOSIS — J9601 Acute respiratory failure with hypoxia: Secondary | ICD-10-CM | POA: Diagnosis not present

## 2022-04-26 LAB — CREATININE, SERUM
Creatinine, Ser: 2.08 mg/dL — ABNORMAL HIGH (ref 0.61–1.24)
GFR, Estimated: 36 mL/min — ABNORMAL LOW (ref >60)

## 2022-04-26 LAB — GLUCOSE, CAPILLARY: Glucose-Capillary: 151 mg/dL — ABNORMAL HIGH (ref 70–99)

## 2022-04-26 MED ORDER — GUAIFENESIN ER 600 MG PO TB12
600.0000 mg | ORAL_TABLET | Freq: Two times a day (BID) | ORAL | 0 refills | Status: AC
  Filled 2022-04-26: qty 20, 10d supply, fill #0

## 2022-04-26 MED ORDER — ALBUTEROL SULFATE (2.5 MG/3ML) 0.083% IN NEBU
2.5000 mg | INHALATION_SOLUTION | RESPIRATORY_TRACT | 0 refills | Status: DC | PRN
  Filled 2022-04-26: qty 300, 17d supply, fill #0

## 2022-04-26 MED ORDER — METHYLPREDNISOLONE SODIUM SUCC 40 MG IJ SOLR
40.0000 mg | Freq: Once | INTRAMUSCULAR | Status: AC
  Administered 2022-04-26: 40 mg via INTRAVENOUS
  Filled 2022-04-26: qty 1

## 2022-04-26 MED ORDER — PREDNISONE 10 MG PO TABS
ORAL_TABLET | ORAL | 0 refills | Status: AC
  Filled 2022-04-26: qty 13, 8d supply, fill #0

## 2022-04-26 MED ORDER — ALLOPURINOL 100 MG PO TABS
ORAL_TABLET | Freq: Every day | ORAL | 0 refills | Status: DC
  Filled 2022-04-26: qty 60, 30d supply, fill #0

## 2022-04-26 MED ORDER — CARVEDILOL 6.25 MG PO TABS
6.2500 mg | ORAL_TABLET | Freq: Two times a day (BID) | ORAL | 0 refills | Status: DC
  Filled 2022-04-26: qty 60, 30d supply, fill #0

## 2022-04-26 MED ORDER — SPIRIVA RESPIMAT 2.5 MCG/ACT IN AERS
2.0000 | INHALATION_SPRAY | Freq: Every day | RESPIRATORY_TRACT | 0 refills | Status: DC
  Filled 2022-04-26: qty 4, 30d supply, fill #0

## 2022-04-26 MED ORDER — HYDROCOD POLI-CHLORPHE POLI ER 10-8 MG/5ML PO SUER: 5.0000 mL | Freq: Two times a day (BID) | ORAL | 0 refills | Status: DC | PRN

## 2022-04-26 MED ORDER — ATORVASTATIN CALCIUM 40 MG PO TABS
40.0000 mg | ORAL_TABLET | Freq: Every day | ORAL | 0 refills | Status: DC
  Filled 2022-04-26: qty 30, 30d supply, fill #0

## 2022-04-26 MED ORDER — RISAQUAD PO CAPS
2.0000 | ORAL_CAPSULE | Freq: Every day | ORAL | 0 refills | Status: DC
  Filled 2022-04-26: qty 60, 30d supply, fill #0

## 2022-04-26 MED ORDER — ASPIRIN 81 MG PO TBEC
81.0000 mg | DELAYED_RELEASE_TABLET | Freq: Every day | ORAL | 0 refills | Status: DC
  Filled 2022-04-26: qty 30, 30d supply, fill #0

## 2022-04-26 MED ORDER — BUDESONIDE-FORMOTEROL FUMARATE 160-4.5 MCG/ACT IN AERO
2.0000 | INHALATION_SPRAY | Freq: Two times a day (BID) | RESPIRATORY_TRACT | 0 refills | Status: DC
  Filled 2022-04-26: qty 10.2, 30d supply, fill #0

## 2022-04-26 MED ORDER — DAPAGLIFLOZIN PROPANEDIOL 10 MG PO TABS
10.0000 mg | ORAL_TABLET | Freq: Every day | ORAL | 0 refills | Status: DC
  Filled 2022-04-26: qty 30, 30d supply, fill #0

## 2022-04-26 MED ORDER — ISOSORB DINITRATE-HYDRALAZINE 20-37.5 MG PO TABS
1.0000 | ORAL_TABLET | Freq: Three times a day (TID) | ORAL | 0 refills | Status: DC
  Filled 2022-04-26: qty 90, 30d supply, fill #0

## 2022-04-26 MED ORDER — MONTELUKAST SODIUM 10 MG PO TABS
ORAL_TABLET | Freq: Every day | ORAL | 0 refills | Status: DC
  Filled 2022-04-26: qty 30, 30d supply, fill #0

## 2022-04-26 MED ORDER — ALBUTEROL SULFATE HFA 108 (90 BASE) MCG/ACT IN AERS
2.0000 | INHALATION_SPRAY | Freq: Four times a day (QID) | RESPIRATORY_TRACT | 0 refills | Status: DC | PRN
  Filled 2022-04-26: qty 6.7, 10d supply, fill #0

## 2022-04-26 MED ORDER — OXYCODONE-ACETAMINOPHEN 7.5-325 MG PO TABS: 1.0000 | ORAL_TABLET | Freq: Four times a day (QID) | ORAL | 0 refills | Status: DC | PRN

## 2022-04-26 MED ORDER — TORSEMIDE 20 MG PO TABS
20.0000 mg | ORAL_TABLET | Freq: Every day | ORAL | 0 refills | Status: DC
  Filled 2022-04-26: qty 30, 30d supply, fill #0

## 2022-04-26 NOTE — Progress Notes (Signed)
   Heart Failure Nurse Navigator Note  Met with patient today, he was sitting up on the edge of the bed, currently on room air in no acute distress.  Went over how is going to take care of himself daily weights, what to report, fluid and sodium restriction.  States prior to the problem prior to him being admitted was he did not have any of his medications.  He states that his sister is the one that gets his medications from the medication management clinic in [place].  Discussed transportation issues, he states that usually his brother is available to take him to his appointments.  We discussed his brother also helping with picking up his medications here in [place].  He voices understanding.  Also made aware that with his Medicaid, looking at the back of the car there is a number that he could call also for transportation issues.  He voices understanding.  Discussed follow-up in the outpatient heart failure clinic for which she has an appointment on 22 November at 4 PM.  He had no further questions.  Pricilla Riffle RN [facility]

## 2022-04-26 NOTE — Discharge Summary (Signed)
Physician Discharge Summary   Patient: Chris [name] MRN: [id number] DOB: Oct 18, 1960  Admit date:     04/19/2022  Discharge date: 04/26/22  Discharge Physician: [name]   PCP: [name], MD   Recommendations at discharge:   Follow-up with PMD 5 days Follow-up with urology Dr. Bernardo Heater as an outpatient for follow-up for kidney mass Follow-up pulmonology  Discharge Diagnoses: Principal Problem:   Acute respiratory failure with hypoxia (Chris [name]) Active Problems:   COPD exacerbation ([place])   Acute on chronic diastolic CHF (congestive heart failure) (HCC)   Agitation   Type 2 diabetes mellitus with stage 3 chronic kidney disease (HCC)   Right kidney mass   Cocaine abuse ([place])   Dyslipidemia   Gout   Obesity (BMI 30-39.9)   RSV (respiratory syncytial virus pneumonia)    Hospital Course: Chris [name] is a 61 y.o. male with medical history significant for CHF, COPD, coronary artery disease, depression, stage IIIb chronic kidney disease, type 2 diabetes mellitus, GERD, gout, hypertension and atrial flutter, who presented to the emergency room with acute onset of worsening shortness of breath over the last 4 days with associated cough as well as nausea and vomiting.  She admitted to associated chest pain as well as generalized weakness..  No fever or chills.  No diarrhea or abdominal pain.  No melena or bright red bleeding per rectum.  No bleeding diathesis.  She denies any dysuria, oliguria, urinary frequency or urgency or flank pain.   ED Course: Upon presentation to the emergency room, BP was 196/131 with respiratory rate of 26 and given tachypnea to 33 and respiratory distress he was placed on BiPAP with a pulse oximetry of 97% on 35% FiO2.  Labs revealed normal CBC.  BMP showed a creatinine of 1.73 with BUN of 24 compared to 43 and 2.26 on 02/03/2022.  BNP was 547.8 above previous levels in July.  High sensitive troponin was 54 and later 64. EKG as reviewed by me :   EKG  showed normal sinus rhythm with fusion complexes, T wave inversion laterally, poor R wave progression and early repolarization in V3. Imaging: Two-view chest x-ray showed chronic cardiomegaly and chronic bronchial thickening with minor bibasilar atelectasis.   The patient was given 40 mg of IV Lasix, DuoNebs twice, 125 mg of IV Solu-Medrol and 500 mill IV normal saline.  He will be admitted to a progressive unit bed for further evaluation and management.  04/21/2022.  As of this morning patient on Solu-Medrol 60 mg IV twice daily and Lasix 40 mg IV twice daily.  With creatinine rising will change to oral Lasix 40 mg twice daily.  Patient was also found to have positive cocaine in his system.  04/22/2022.  Patient still with a lot of bronchospasm requiring IV Solu-Medrol.  RSV test positive.  11/10.  Patient becoming more agitated during the day.  We will place empirically on alcohol withdrawal protocol.  11/11.  Patient denies drinking any alcohol.  Not agitated today.  Advised slow deep breaths.  Continue incentive spirometer  11/12.  Patient still with a lot of bronchospasm.  Still on 3 L of oxygen  11/13.  Patient able to come off oxygen and breathing comfortably on room air.  Shortness of breath and wheeze improved.  Ready for discharge home.  Assessment and Plan: * Acute respiratory failure with hypoxia ([place]) The patient initially required BiPAP secondary to respiratory distress.  The patient was on oxygen most of the hospital course.  Patient able to come off oxygen prior to disposition and moving better air.  COPD exacerbation (Chris [name]) RSV infection.  Since the patient was on Solu-Medrol during the entire hospital course I will taper off Solu-Medrol as outpatient.   Refill patient's nebulizer solution and inhalers.  Agitation Improved over the last 3 days.  He does not drink alcohol.  Acute on chronic diastolic CHF (congestive heart failure) (HCC) - Last 2D echo on 01/26/2022 revealed an  EF of 50% with grade 1 diastolic dysfunction and mild to moderate left atrial dilatation. - We will continue Farxiga, Coreg and BiDil and can go back on torsemide as outpatient.  Patient was given IV diuresis with IV Lasix here in the hospital   Type 2 diabetes mellitus with stage 3 chronic kidney disease (Chris [name]) -Continue Farxiga  Right kidney mass Case discussed with Dr. Bernardo Heater urology will refer to him as outpatient.  Obesity (BMI 30-39.9) BMI 35.28.  Gout Continue allopurinol  Dyslipidemia Continue statin therapy.  Cocaine abuse (HCC) Cocaine seen and urine toxicology.          Consultants: Pulmonary Procedures performed: None Disposition: Home Diet recommendation:  Cardiac and Carb modified diet DISCHARGE MEDICATION: Allergies as of 04/26/2022   No Known Allergies      Medication List     STOP taking these medications    colchicine 0.6 MG tablet   glipiZIDE 5 MG tablet Commonly known as: GLUCOTROL   ipratropium-albuterol 0.5-2.5 (3) MG/3ML Soln Commonly known as: DUONEB   ketorolac 0.5 % ophthalmic solution Commonly known as: Acular   polyethylene glycol powder 17 GM/SCOOP powder Commonly known as: GLYCOLAX/MIRALAX       TAKE these medications    Accu-Chek Guide test strip Generic drug: glucose blood Use as directed to check blood sugar 1-2 times a day   Accu-Chek Guide w/Device Kit Use as directed   Accu-Chek Softclix Lancets lancets Use as instructed   acidophilus Caps capsule Take 2 capsules by mouth daily.   albuterol 108 (90 Base) MCG/ACT inhaler Commonly known as: Ventolin HFA Inhale 2 puffs into the lungs every 6 (six) hours as needed for wheezing or shortness of breath. What changed: Another medication with the same name was added. Make sure you understand how and when to take each.   albuterol (2.5 MG/3ML) 0.083% nebulizer solution Commonly known as: PROVENTIL Take 3 mLs (2.5 mg total) by nebulization every 4 (four) hours  as needed for wheezing or shortness of breath. What changed: You were already taking a medication with the same name, and this prescription was added. Make sure you understand how and when to take each.   allopurinol 100 MG tablet Commonly known as: ZYLOPRIM TAKE 2 TABLETS (200 MG TOTAL) BY MOUTH DAILY.   aspirin EC 81 MG tablet Take 1 tablet (81 mg total) by mouth daily.   atorvastatin 40 MG tablet Commonly known as: LIPITOR Take 1 tablet (40 mg total) by mouth daily.   BiDil 20-37.5 MG tablet Generic drug: isosorbide-hydrALAZINE Take 1 tablet by mouth 3 (three) times daily.   carvedilol 6.25 MG tablet Commonly known as: COREG Take 1 tablet (6.25 mg total) by mouth 2 (two) times daily.   chlorpheniramine-HYDROcodone 10-8 MG/5ML Commonly known as: TUSSIONEX Take 5 mLs by mouth every 12 (twelve) hours as needed for cough.   Farxiga 10 MG Tabs tablet Generic drug: dapagliflozin propanediol Take 1 tablet (10 mg total) by mouth daily.   FT Mucus Relief 12HR 600 MG 12 hr tablet Generic drug: guaiFENesin  Take 1 tablet (600 mg total) by mouth 2 (two) times daily for 7 days.   montelukast 10 MG tablet Commonly known as: SINGULAIR TAKE 1 TABLET (10 MG TOTAL) BY MOUTH AT BEDTIME.   oxyCODONE-acetaminophen 7.5-325 MG tablet Commonly known as: PERCOCET Take 1 tablet by mouth every 6 (six) hours as needed for moderate pain.   predniSONE 10 MG tablet Commonly known as: DELTASONE Take 3 tablets (30 mg total) by mouth daily for 2 days, THEN 2 tablets (20 mg total) daily for 2 days, THEN 1 tablet (10 mg total) daily for 2 days, THEN 0.5 tablets (5 mg total) daily for 2 days. Start taking on: April 26, 2022 What changed:  medication strength See the new instructions.   Spiriva Respimat 2.5 MCG/ACT Aers Generic drug: Tiotropium Bromide Monohydrate Inhale 2 puffs into the lungs daily.   Symbicort 160-4.5 MCG/ACT inhaler Generic drug: budesonide-formoterol Inhale 2 puffs into  the lungs 2 (two) times daily.   torsemide 20 MG tablet Commonly known as: DEMADEX Take 1 tablet (20 mg total) by mouth daily.        Follow-up Information     Stoioff, Ronda Fairly, MD. Go in 1 week(s).   Specialty: Urology Why: Appointment on Wednesday, 05/05/2022 at 8:00am. Contact information: [place] [address] [phone]         Tyler Pita, [place] in 3 week(s).   Specialty: Pulmonary Disease Why: Appointment on Monday, 05/17/2022 at 11:45am. Contact information: [place] [address] [phone]                Discharge Exam: Danley Danker Weights   04/21/22 0500 04/22/22 0349 04/26/22 0411  Weight: 118.3 kg 118 kg 118.7 kg   Physical Exam HENT:     Head: Normocephalic.     Mouth/Throat:     Pharynx: No oropharyngeal exudate.  Eyes:     General: Lids are normal.     Conjunctiva/sclera: Conjunctivae normal.  Cardiovascular:     Rate and Rhythm: Normal rate and regular rhythm.     Heart sounds: Normal heart sounds, S1 normal and S2 normal.  Pulmonary:     Breath sounds: Examination of the right-lower field reveals decreased breath sounds. Examination of the left-lower field reveals decreased breath sounds. Decreased breath sounds present. No wheezing, rhonchi or rales.  Abdominal:     Palpations: Abdomen is soft.     Tenderness: There is no abdominal tenderness.     Comments: Left flank area in the area of hardness.  Musculoskeletal:     Right lower leg: Swelling present.     Left lower leg: Swelling present.  Skin:    General: Skin is warm.     Findings: No rash.  Neurological:     Mental Status: He is alert and oriented to person, place, and time.      Condition at discharge: stable  The results of significant diagnostics from this hospitalization (including imaging, microbiology, ancillary and laboratory) are listed below for reference.   Imaging Studies: DG Chest 2  View  Result Date: 04/23/2022 CLINICAL DATA:  Worsening shortness of breath for 4 days, cough, nausea and vomiting, RSV, COPD, coronary artery disease EXAM: CHEST - 2 VIEW COMPARISON:  04/19/2022 FINDINGS: Enlargement of cardiac silhouette with pulmonary vascular congestion. Atherosclerotic calcification aorta. Lungs clear with decreased bibasilar atelectasis. Mild chronic central peribronchial thickening. No pleural effusion or pneumothorax. IMPRESSION: Enlargement of cardiac silhouette with pulmonary vascular congestion.  Mild chronic bronchitic changes with improved bibasilar atelectasis since 04/19/2022. Aortic Atherosclerosis (ICD10-I70.0). Electronically Signed   By: Lavonia Dana M.D.   On: 04/23/2022 15:14   MR ABDOMEN W WO CONTRAST  Result Date: 04/23/2022 CLINICAL DATA:  Evaluate for kidney mass. Abnormal renal ultrasound. EXAM: MRI ABDOMEN WITHOUT AND WITH CONTRAST TECHNIQUE: Multiplanar multisequence MR imaging of the abdomen was performed both before and after the administration of intravenous contrast. CONTRAST:  68m GADAVIST GADOBUTROL 1 MMOL/ML IV SOLN COMPARISON:  Abdominal sonogram from 04/22/2022 FINDINGS: Diminished exam detail secondary to motion artifact. Lower chest: No acute findings. Hepatobiliary: No focal liver abnormality. The gallbladder appears decompressed. No bile duct dilatation identified. Pancreas: No mass, inflammatory changes, or other parenchymal abnormality identified. Spleen:  Within normal limits in size and appearance. Adrenals/Urinary Tract:  Normal appearance of the adrenal glands. No hydronephrosis identified bilaterally. Arising off the upper pole of the right kidney is a T1 isointense, T2 hypointense enhancing lesion which measures 2.8 x 2.5 cm, image 28/13. 1.6 cm T1 hyperintense, nonenhancing lesion arising off the inferior pole of the left kidney compatible with a benign Bosniak class 2 cyst, image 55/12. No follow-up imaging recommended. 1.1 cm T1  hyperintense, nonenhancing lesion arising off the upper pole of the right kidney is compatible with a Bosniak class 2 lesion, image 29/12. No follow-up imaging recommended. Additional bilateral, too small to characterize, lesions are compatible with benign Bosniak class 2 lesions. No follow-up imaging recommended. Stomach/Bowel: Visualized portions within the abdomen are unremarkable. Vascular/Lymphatic: No pathologically enlarged lymph nodes identified. No abdominal aortic aneurysm demonstrated. Other:  No ascites or focal fluid collections. Musculoskeletal: No suspicious bone lesions identified. IMPRESSION: 1. Corresponding to the ultrasound abnormality is a lesion arising off the upper pole of the right kidney. This measures 2.8 x 2.5 cm and exhibits enhancement on the postcontrast images concerning for a solid renal neoplasm. Recommend referral to urology for further management. 2. Additional bilateral Bosniak class 2 kidney lesions for which no additional follow-up imaging is recommended. Electronically Signed   By: TKerby MoorsM.D.   On: 04/23/2022 06:34   UKoreaAbdomen Complete  Result Date: 04/22/2022 CLINICAL DATA:  Abdominal pain; history CHF, COPD, chronic kidney disease, diabetes mellitus, hypertension EXAM: ABDOMEN ULTRASOUND COMPLETE COMPARISON:  Renal ultrasound 10/21/2013 FINDINGS: Gallbladder: Normally distended without stones or wall thickening. No pericholecystic fluid or sonographic Murphy sign. Common bile duct: Diameter: 4 mm, normal Liver: Normal echogenicity without mass or nodularity. No intrahepatic biliary dilatation. Portal vein is patent on color Doppler imaging with normal direction of blood flow towards the liver. IVC: No abnormality visualized. Pancreas: Visualized portion unremarkable. Spleen: Normal appearance, 9.4 cm length Right Kidney: Length: 11.2 cm. Normal cortical thickness and echogenicity. Question solid cortical mass versus lobulated renal contour 2.9 x 2.8 x 2.7 cm.  Additional tiny RIGHT renal cyst 9 mm diameter. No hydronephrosis or shadowing calcification. Left Kidney: Length: 9.8 cm. Normal cortical thickness. Upper normal cortical echogenicity. Small cyst lateral kidney 1.8 x 1.5 x 1.6 cm, simple features; no follow-up imaging recommended. No additional mass or hydronephrosis. No shadowing calcification. Abdominal aorta: Visualized portion normal caliber, incompletely visualized due to bowel gas Other findings: No free fluid IMPRESSION: Question solid RIGHT renal mass versus lobulated RIGHT cortex 2.9 cm greatest size; follow-up MR imaging with and without contrast recommended to exclude renal neoplasm. Additional small BILATERAL renal cysts; no followup of these lesions is recommended. Electronically Signed   By: MLavonia DanaM.D.   On: 04/22/2022 15:00  DG Chest 2 View  Result Date: 04/19/2022 CLINICAL DATA:  Chest pain. EXAM: CHEST - 2 VIEW COMPARISON:  Most recent radiograph 01/04/2022 chest CT 04/27/2021 reviewed FINDINGS: Chronic cardiomegaly. Unchanged mediastinal contours with aortic atherosclerosis. Chronic bronchial thickening with minor bibasilar atelectasis. No confluent consolidation. No pleural effusion or pneumothorax. No features of pulmonary edema. No acute osseous abnormalities. IMPRESSION: Chronic cardiomegaly. Chronic bronchial thickening and minor bibasilar atelectasis. Electronically Signed   By: Keith Rake M.D.   On: 04/19/2022 16:22    Microbiology: Results for orders placed or performed during the hospital encounter of 04/19/22  Resp Panel by RT-PCR (Flu A&B, Covid) Anterior Nasal Swab     Status: None   Collection Time: 04/19/22  5:13 PM   Specimen: Anterior Nasal Swab  Result Value Ref Range Status   SARS Coronavirus 2 by RT PCR NEGATIVE NEGATIVE Final    Comment: (NOTE) SARS-CoV-2 target nucleic acids are NOT DETECTED.  The SARS-CoV-2 RNA is generally detectable in upper respiratory specimens during the acute phase of  infection. The lowest concentration of SARS-CoV-2 viral copies this assay can detect is 138 copies/mL. A negative result does not preclude SARS-Cov-2 infection and should not be used as the sole basis for treatment or other patient management decisions. A negative result may occur with  improper specimen collection/handling, submission of specimen other than nasopharyngeal swab, presence of viral mutation(s) within the areas targeted by this assay, and inadequate number of viral copies(<138 copies/mL). A negative result must be combined with clinical observations, patient history, and epidemiological information. The expected result is Negative.  Fact Sheet for Patients:  EntrepreneurPulse.com.au  Fact Sheet for Healthcare Providers:  IncredibleEmployment.be  This test is no t yet approved or cleared by the [place] FDA and  has been authorized for detection and/or diagnosis of SARS-CoV-2 by FDA under an Emergency Use Authorization (EUA). This EUA will remain  in effect (meaning this test can be used) for the duration of the COVID-19 declaration under Section 564(b)(1) of the Act, 21 U.S.C.section 360bbb-3(b)(1), unless the authorization is terminated  or revoked sooner.       Influenza A by PCR NEGATIVE NEGATIVE Final   Influenza B by PCR NEGATIVE NEGATIVE Final    Comment: (NOTE) The Xpert Xpress SARS-CoV-2/FLU/RSV plus assay is intended as an aid in the diagnosis of influenza from Nasopharyngeal swab specimens and should not be used as a sole basis for treatment. Nasal washings and aspirates are unacceptable for Xpert Xpress SARS-CoV-2/FLU/RSV testing.  Fact Sheet for Patients: EntrepreneurPulse.com.au  Fact Sheet for Healthcare Providers: IncredibleEmployment.be  This test is not yet approved or cleared by the [place] FDA and has been authorized for detection and/or diagnosis of SARS-CoV-2  by FDA under an Emergency Use Authorization (EUA). This EUA will remain in effect (meaning this test can be used) for the duration of the COVID-19 declaration under Section 564(b)(1) of the Act, 21 U.S.C. section 360bbb-3(b)(1), unless the authorization is terminated or revoked.  Performed at [facility], [place], [place] [address]   Respiratory (~20 pathogens) panel by PCR     Status: Abnormal   Collection Time: 04/20/22 12:30 PM   Specimen: Nasopharyngeal Swab; Respiratory  Result Value Ref Range Status   Adenovirus NOT DETECTED NOT DETECTED Final   Coronavirus 229E NOT DETECTED NOT DETECTED Final    Comment: (NOTE) The Coronavirus on the Respiratory Panel, DOES NOT test for the novel  Coronavirus (2019 nCoV)    Coronavirus HKU1 NOT DETECTED  NOT DETECTED Final   Coronavirus NL63 NOT DETECTED NOT DETECTED Final   Coronavirus OC43 NOT DETECTED NOT DETECTED Final   Metapneumovirus NOT DETECTED NOT DETECTED Final   Rhinovirus / Enterovirus NOT DETECTED NOT DETECTED Final   Influenza A NOT DETECTED NOT DETECTED Final   Influenza B NOT DETECTED NOT DETECTED Final   Parainfluenza Virus 1 NOT DETECTED NOT DETECTED Final   Parainfluenza Virus 2 NOT DETECTED NOT DETECTED Final   Parainfluenza Virus 3 NOT DETECTED NOT DETECTED Final   Parainfluenza Virus 4 NOT DETECTED NOT DETECTED Final   Respiratory Syncytial Virus DETECTED (A) NOT DETECTED Final   Bordetella pertussis NOT DETECTED NOT DETECTED Final   Bordetella Parapertussis NOT DETECTED NOT DETECTED Final   Chlamydophila pneumoniae NOT DETECTED NOT DETECTED Final   Mycoplasma pneumoniae NOT DETECTED NOT DETECTED Final    Comment: Performed at [facility], [place] [address] [place], [place] [address]  Expectorated Sputum Assessment w Gram Stain, Rflx to Resp Cult     Status: None   Collection Time: 04/20/22  6:40 PM   Specimen: Sputum  Result Value Ref Range Status   Specimen Description SPUTUM   Final   Special Requests Normal  Final   Sputum evaluation   Final    THIS SPECIMEN IS ACCEPTABLE FOR SPUTUM CULTURE Performed at [facility], [address], [place], [place] [address]    Report Status 04/20/2022 FINAL  Final  Culture, Respiratory w Gram Stain     Status: None   Collection Time: 04/20/22  6:40 PM   Specimen: SPU  Result Value Ref Range Status   Specimen Description   Final    SPUTUM Performed at [facility], [address] [place], [place] [address]    Special Requests   Final    Normal Reflexed from [phone] Performed at [facility], [place] [place], [place] [address]    Gram Stain   Final    NO WBC SEEN RARE GRAM POSITIVE COCCI IN PAIRS RARE GRAM NEGATIVE RODS    Culture   Final    MODERATE Normal respiratory flora-no Staph aureus or Pseudomonas seen Performed at [facility], [place] [address], [place],  [address]    Report Status 04/23/2022 FINAL  Final    Labs: CBC: Recent Labs  Lab 04/19/22 1712 04/20/22 0607 04/21/22 0441  WBC 9.5 8.3 12.2*  NEUTROABS 6.5  --   --   HGB 14.8 15.0 14.2  HCT 45.7 45.4 43.4  MCV 90.7 89.2 90.0  PLT 196 211 784   Basic Metabolic Panel: Recent Labs  Lab 04/19/22 1605 04/20/22 0607 04/21/22 0441 04/22/22 0533 04/24/22 0759 04/26/22 0633  NA 140 138 138 137 138  --   K 4.2 4.8 4.6 4.9 4.9  --   CL 105 107 106 103 107  --   CO2 _0 --   GLUCOSE 80 137* 105* 128* 126*  --   BUN 24* 36* 50* 57* 51*  --   CREATININE 1.73* 1.93* 2.20* 1.92* 1.93* 2.08*  CALCIUM 9.4 9.6 8.8* 9.0 8.9  --    Liver Function Tests: Recent Labs  Lab 04/19/22 1605  AST 29  ALT 21  ALKPHOS 78  BILITOT 0.7  PROT 8.1  ALBUMIN 4.0   CBG: Recent Labs  Lab 04/25/22 0834 04/25/22 1200 04/25/22 1643 04/25/22 2056 04/26/22 0907  GLUCAP 90 100* 108* 169* 151*    Discharge time spent:  greater than 30 minutes.  Signed: [name], MD [facility] 04/26/2022

## 2022-04-27 ENCOUNTER — Telehealth: Payer: Self-pay

## 2022-04-27 ENCOUNTER — Ambulatory Visit: Payer: Self-pay | Admitting: *Deleted

## 2022-04-27 ENCOUNTER — Telehealth: Payer: Self-pay | Admitting: Internal Medicine

## 2022-04-27 ENCOUNTER — Ambulatory Visit: Payer: Medicaid Other | Admitting: Student in an Organized Health Care Education/Training Program

## 2022-04-27 NOTE — Telephone Encounter (Signed)
Reason for Disposition  [1] MODERATE pain (e.g., interferes with normal activities) AND [2] pain comes and goes (cramps) AND [3] present > 24 hours  (Exception: Pain with Vomiting or Diarrhea - see that Guideline.)    Has a mass on his kidney.  Requesting more pain medication from where he was in the hospitl  Answer Assessment - Initial Assessment Questions 1. LOCATION: "Where does it hurt?"      I'm having abd pain.   I had a MRI and I have a mass in my abd.   I'm in pain.   They should not release me from the hospital. I was in the hospital and they found a mass in my kidney. They gave me some pain medicine when I was discharged from hospital.  I had Percocet.    2. RADIATION: "Does the pain shoot anywhere else?" (e.g., chest, back)     No 3. ONSET: "When did the pain begin?" (Minutes, hours or days ago)      They found a mass in my kidney.  I'm in a lot of pain and I don't have any more Percocet left they gave me at the hospital.   Pt has a hospital f/u appt. On Nov 28. I let him know I would send a message to [facility] and see what they can do regarding pain medication. 4. SUDDEN: "Gradual or sudden onset?"     Triage stopped here since diagnosed in hospital and just needing pain medication.   5. PATTERN "Does the pain come and go, or is it constant?"    - If it comes and goes: "How long does it last?" "Do you have pain now?"     (Note: Comes and goes means the pain is intermittent. It goes away completely between bouts.)    - If constant: "Is it getting better, staying the same, or getting worse?"      (Note: Constant means the pain never goes away completely; most serious pain is constant and gets worse.)       6. SEVERITY: "How bad is the pain?"  (e.g., Scale 1-10; mild, moderate, or severe)    - MILD (1-3): Doesn't interfere with normal activities, abdomen soft and not tender to touch.     - MODERATE (4-7): Interferes with normal activities or awakens from sleep,  abdomen tender to touch.     - SEVERE (8-10): Excruciating pain, doubled over, unable to do any normal activities.        7. RECURRENT SYMPTOM: "Have you ever had this type of stomach pain before?" If Yes, ask: "When was the last time?" and "What happened that time?"       8. CAUSE: "What do you think is causing the stomach pain?"      9. RELIEVING/AGGRAVATING FACTORS: "What makes it better or worse?" (e.g., antacids, bending or twisting motion, bowel movement)      10. OTHER SYMPTOMS: "Do you have any other symptoms?" (e.g., back pain, diarrhea, fever, urination pain, vomiting)  Protocols used: Abdominal Pain - Male-A-AH  Chief Complaint: C/o pain in right abd due to a mass in his right kidney.  He was discharged from the hospital with 8 Percocet tablets but needs more due to severe pain he is having. He has a hospital f/u appt. On 05/11/2022.   I let him know I didn't know if more pain medication would be prescribed or not since he had not been seen since being discharged from the hospital. Symptoms:  Right abd pain Frequency: constant severe per pt. Pertinent Negatives: Patient denies N/A Disposition: '[]'$ ED /'[]'$ Urgent Care (no appt availability in office) / '[]'$ Appointment(In office/virtual)/ '[]'$  [facility]/ '[]'$ Home Care/ '[]'$ Refused Recommended Disposition /'[]'$ [facility]/ '[x]'$  Follow-up with PCP Additional Notes: Request for pain medication sent to [facility] for Dr. Karle Plumber.  Pt . Was agreeable to someone calling him back.

## 2022-04-27 NOTE — Telephone Encounter (Signed)
Transition Care Management Follow-up Telephone Call Date of discharge and from where: 04/26/2022, [facility] How have you been since you were released from the hospital? He said he doesn't feel up to par.  He reports having dizzy spells even when sitting.  He said he told the providers in the hospital about this but nothing was done to address this concern  He said his stomach is swollen and he has abdominal pain. He stated he has been able to eat, no nausea or vomiting.  He is concerned about the right kidney mass and said his right side is painful. He would like to see the urologist sooner than 05/05/2022 and I told him to call the urology clinic and inquire if there is an appointment available sooner  No headaches, chest pain reported. He is requesting more percocet because the abdominal pain is severe,  He said he was only given 8 tablets and he needs more. He would like to talk to Dr Wynetta Emery. I explained that she may not prescribe additional pain medication and he said it is urgent that he speak with her. I explained that if his pain is that severe, he should be assessed in UC/ED and he said he understood but he wanted to speak to Dr Wynetta Emery.  He needs a glucometer  Any questions or concerns? Yes- noted above  Items Reviewed: Did the pt receive and understand the discharge instructions provided? Yes  Medications obtained and verified? Yes - he has all of his medications as well as a nebulizer and did not have any questions about the med regime Other? No  Any new allergies since your discharge? No  Dietary orders reviewed? Yes Do you have support at home? Yes - lives alone but his brother provides support  Home Care and Equipment/Supplies: Were home health services ordered? no If so, what is the name of the agency? N/a  Has the agency set up a time to come to the patient's home? not applicable Were any new equipment or medical supplies ordered?  No What is the name of  the medical supply agency? N/a Were you able to get the supplies/equipment? not applicable Do you have any questions related to the use of the equipment or supplies? No  Functional Questionnaire: (I = Independent and D = Dependent) ADLs: independent  Follow up appointments reviewed:  PCP Hospital f/u appt confirmed? Yes  Scheduled to see Dr Wynetta Emery - 05/11/2022.  [facility] f/u appt confirmed? Yes  Scheduled to see cardiology - 05/03/2022, urology - 05/05/2022, [facility]- 05/05/2022, pulmonary - 05/17/2022.  Are transportation arrangements needed? No  If their condition worsens, is the pt aware to call PCP or go to the Emergency Dept.? Yes Was the patient provided with contact information for the PCP's office or ED? Yes Was to pt encouraged to call back with questions or concerns? Yes

## 2022-04-28 ENCOUNTER — Other Ambulatory Visit: Payer: Self-pay | Admitting: Internal Medicine

## 2022-04-28 MED ORDER — ACCU-CHEK SOFTCLIX LANCETS MISC: 12 refills | Status: DC

## 2022-04-28 MED ORDER — ACCU-CHEK GUIDE W/DEVICE KIT: PACK | 0 refills | Status: DC

## 2022-04-28 MED ORDER — ACCU-CHEK GUIDE VI STRP: ORAL_STRIP | 12 refills | Status: DC

## 2022-04-28 NOTE — Telephone Encounter (Signed)
PC returned to patient this morning. Patient requesting more oxycodone stating that he has pain in the lower abdomen from mass that was seen on MRI of the abdomen that was done in the hospital.  He was found to have what looks like a solid mass in the upper pole of the right kidney.  Patient states he was told by the discharging physician that if he needed more pain medication he should contact his PCP.  He endorses no nausea or vomiting.  He is moving his bowels okay. No fever. I told him that I do not think the incidental mass seen in the right kidney should be causing pain. Advise to be seen in ER if pain gets worse. He is wanting to know whether the appointment for the urologist could be moved up.  I told him that he can call [facility] and see whether they have an earlier appointment otherwise, advised to keep his appointment with them next week. Patient informed that I have sent a prescription to his pharmacy for diabetic testing supplies.

## 2022-04-28 NOTE — Telephone Encounter (Signed)
I received a message from Dr Wynetta Emery stating she already spoke with the patient this morning and he did not need a call back

## 2022-05-03 ENCOUNTER — Other Ambulatory Visit: Payer: Self-pay

## 2022-05-03 ENCOUNTER — Ambulatory Visit: Payer: Medicaid Other | Attending: Cardiology | Admitting: Cardiology

## 2022-05-03 ENCOUNTER — Other Ambulatory Visit
Admission: RE | Admit: 2022-05-03 | Discharge: 2022-05-03 | Disposition: A | Discharge status: Home / Self Care | Payer: Medicaid Other | Source: Ambulatory Visit | Attending: Cardiology | Admitting: Cardiology

## 2022-05-03 ENCOUNTER — Encounter: Payer: Self-pay | Admitting: Cardiology

## 2022-05-03 VITALS — BP 140/100 | HR 92 | Ht 72.0 in | Wt 257.0 lb

## 2022-05-03 DIAGNOSIS — I5042 Chronic combined systolic (congestive) and diastolic (congestive) heart failure: Secondary | ICD-10-CM

## 2022-05-03 DIAGNOSIS — I5020 Unspecified systolic (congestive) heart failure: Secondary | ICD-10-CM | POA: Insufficient documentation

## 2022-05-03 DIAGNOSIS — I517 Cardiomegaly: Secondary | ICD-10-CM | POA: Diagnosis not present

## 2022-05-03 DIAGNOSIS — IMO0001 Reserved for inherently not codable concepts without codable children: Secondary | ICD-10-CM

## 2022-05-03 DIAGNOSIS — I10 Essential (primary) hypertension: Secondary | ICD-10-CM

## 2022-05-03 DIAGNOSIS — F17200 Nicotine dependence, unspecified, uncomplicated: Secondary | ICD-10-CM

## 2022-05-03 DIAGNOSIS — I4892 Unspecified atrial flutter: Secondary | ICD-10-CM | POA: Diagnosis not present

## 2022-05-03 LAB — HEMOGLOBIN AND HEMATOCRIT, BLOOD
HCT: 49.3 % (ref 39.0–52.0)
Hemoglobin: 15.8 g/dL (ref 13.0–17.0)

## 2022-05-03 MED ORDER — ISOSORB DINITRATE-HYDRALAZINE 20-37.5 MG PO TABS
2.0000 | ORAL_TABLET | Freq: Three times a day (TID) | ORAL | 1 refills | Status: DC
  Filled 2022-05-03: qty 180, 30d supply, fill #0

## 2022-05-03 NOTE — Patient Instructions (Signed)
Medication Instructions:   Your physician has recommended you make the following change in your medication:    INCREASE your Isosorbide-Hydralazine (Bidil) 20-37.5 MG to 2 tablets THREE TIMES A DAY.  *If you need a refill on your cardiac medications before your next appointment, please call your pharmacy*   Lab Work:  Please go to the [facility] after your appointment today for an H/H lab draw.   Testing/Procedures:  Someone will call you to schedule your Cardiac MRI.  It will be at:  [facility] [place], [place] [address] [phone] Please take advantage of the free valet parking available at the MAIN entrance. Proceed to [facility] registration for check-in (first floor).  Magnetic resonance imaging (MRI) is a painless test that produces images of the inside of the body without using Xrays.  During an MRI, strong magnets and radio waves work together in a [employer], [employer] party to form detailed images.   MRI images may provide more details about a medical condition than X-rays, CT scans, and ultrasounds can provide.  You may be given earphones to listen for instructions.  You may eat a light breakfast and take your regular medications. Please avoid stimulants for 12 hr prior to test. (Ie. Caffeine, nicotine, chocolate, or antihistamine medications)  If a contrast material will be used, an IV will be inserted into one of your veins. Contrast material will be injected into your IV. It will leave your body through your urine within a day. You may be told to drink plenty of fluids to help flush the contrast material out of your system.  You will be asked to remove all metal, including: Watch, jewelry, and other metal objects including hearing aids, hair pieces and dentures. Also wearable glucose monitoring systems (ie. Freestyle Libre and Omnipods) (Braces and fillings normally are not a problem.)   TEST WILL TAKE APPROXIMATELY 1 HOUR  PLEASE  NOTIFY SCHEDULING AT LEAST 24 HOURS IN ADVANCE IF YOU ARE UNABLE TO KEEP YOUR APPOINTMENT. [phone]  Please call Marchia Bond, cardiac imaging nurse navigator with any questions/concerns. Marchia Bond RN Navigator Cardiac Imaging Gordy Clement RN Navigator Cardiac Imaging [facility] [phone] Office     Follow-Up: At [facility], you and your health needs are our priority.  As part of our continuing mission to provide you with exceptional heart care, we have created designated Provider Care Teams.  These Care Teams include your primary Cardiologist (physician) and Advanced Practice Providers (APPs -  Physician Assistants and Nurse Practitioners) who all work together to provide you with the care you need, when you need it.  We recommend signing up for the patient portal called "MyChart".  Sign up information is provided on this After Visit Summary.  MyChart is used to connect with patients for Virtual Visits (Telemedicine).  Patients are able to view lab/test results, encounter notes, upcoming appointments, etc.  Non-urgent messages can be sent to your provider as well.   To learn more about what you can do with MyChart, go to [email or web address].    Your next appointment:   3 month(s)  The format for your next appointment:   In Person  Provider:   [name], MD    Other Instructions   Important Information About Sugar

## 2022-05-03 NOTE — Progress Notes (Addendum)
Cardiology Office Note:    Date:  05/03/2022   ID:  Chris [name], DOB 06/20/1960, MRN [id number]  PCP:  Chris Pier, MD  [facility] Cardiologist:   [place] Electrophysiologist:  None   Referring MD: Chris Pier, MD   Chief Complaint  Patient presents with   Follow-up    3 month F/U-No new cardiac concerns    History of Present Illness:    Chris [name] is a 61 y.o. male with a hx of hypertension, obesity, HFrEF EF 25%, atrial flutter s/p DCCV 06/2020, COPD, current smoker x40+ years, former cocaine use x25+ years, LVH, CKD who presents for follow-up.    Being seen due to cardiomyopathy, hypertension.  Tolerating current medications as prescribed, he still smokes, is working on quitting.  Blood pressures have been elevated at home of late, systolics in the 532D.  Denies edema, tolerating current medications as prescribed.  Prior notes Lexiscan Myoview 07/2021 fixed inferior inferolateral defect, no significant coronary calcifications, no significant ischemia. Suddenly admitted for shortness of breath, found to be in atrial flutter with RVR.  Family history of congestive heart failure, unsure if this is HCM. Underwent a TEE guided DC cardioversion 06/2020 .  Echocardiogram obtained 06/2020 while in the hospital showed EF of 25%.  left heart cath 2005 at [place] years ago with no obstructive disease.  Past Medical History:  Diagnosis Date   Arrhythmia    atrial flutter   CHF (congestive heart failure) (HCC)    Chronic kidney disease    COPD (chronic obstructive pulmonary disease) ([place])    Coronary artery disease    Depression    Diabetes mellitus without complication ([place])    GERD (gastroesophageal reflux disease)    Gout    Hypertension    Influenza A with respiratory manifestations    Mental disorder     Past Surgical History:  Procedure Laterality Date   ANKLE SURGERY     CARDIAC CATHETERIZATION     CARDIOVERSION N/A 07/08/2020   Procedure:  CARDIOVERSION;  Surgeon: Chris Skains, MD;  Location: [facility] ORS;  Service: Cardiovascular;  Laterality: N/A;   COLONOSCOPY WITH PROPOFOL N/A 11/19/2021   Procedure: COLONOSCOPY WITH PROPOFOL;  Surgeon: Chris November, MD;  Location: [facility];  Service: Gastroenterology;  Laterality: N/A;   HERNIA REPAIR     x2   POLYPECTOMY  11/19/2021   Procedure: POLYPECTOMY;  Surgeon: Chris November, MD;  Location: [facility];  Service: Gastroenterology;;   SHOULDER SURGERY     TEE WITHOUT CARDIOVERSION N/A 07/08/2020   Procedure: TRANSESOPHAGEAL ECHOCARDIOGRAM (TEE);  Surgeon: Chris Skains, MD;  Location: [facility] ORS;  Service: Cardiovascular;  Laterality: N/A;    Current Medications: Current Meds  Medication Sig   Accu-Chek Softclix Lancets lancets Use as instructed   acidophilus (RISAQUAD) CAPS capsule Take 2 capsules by mouth daily.   albuterol (PROVENTIL) (2.5 MG/3ML) 0.083% nebulizer solution Take 3 mLs (2.5 mg total) by nebulization every 4 (four) hours as needed for wheezing or shortness of breath.   albuterol (VENTOLIN HFA) 108 (90 Base) MCG/ACT inhaler Inhale 2 puffs into the lungs every 6 (six) hours as needed for wheezing or shortness of breath.   allopurinol (ZYLOPRIM) 100 MG tablet TAKE 2 TABLETS (200 MG TOTAL) BY MOUTH DAILY.   aspirin EC 81 MG tablet Take 1 tablet (81 mg total) by mouth daily.   atorvastatin (LIPITOR) 40 MG tablet Take 1 tablet (40 mg total) by mouth daily.   Blood  Glucose Monitoring Suppl (ACCU-CHEK GUIDE) w/Device KIT Use as directed   budesonide-formoterol (SYMBICORT) 160-4.5 MCG/ACT inhaler Inhale 2 puffs into the lungs 2 (two) times daily.   carvedilol (COREG) 6.25 MG tablet Take 1 tablet (6.25 mg total) by mouth 2 (two) times daily.   chlorpheniramine-HYDROcodone (TUSSIONEX) 10-8 MG/5ML Take 5 mLs by mouth every 12 (twelve) hours as needed for cough.   dapagliflozin propanediol (FARXIGA) 10 MG TABS tablet Take 1 tablet (10 mg total) by mouth daily.    glucose blood (ACCU-CHEK GUIDE) test strip Use as directed to check blood sugar 1-2 times a day   guaiFENesin (MUCINEX) 600 MG 12 hr tablet Take 1 tablet (600 mg total) by mouth 2 (two) times daily for 7 days.   montelukast (SINGULAIR) 10 MG tablet TAKE 1 TABLET (10 MG TOTAL) BY MOUTH AT BEDTIME.   oxyCODONE-acetaminophen (PERCOCET) 7.5-325 MG tablet Take 1 tablet by mouth every 6 (six) hours as needed for moderate pain.   predniSONE (DELTASONE) 10 MG tablet Take 3 tablets (30 mg total) by mouth daily for 2 days, THEN 2 tablets (20 mg total) daily for 2 days, THEN 1 tablet (10 mg total) daily for 2 days, THEN 0.5 tablets (5 mg total) daily for 2 days.   Tiotropium Bromide Monohydrate (SPIRIVA RESPIMAT) 2.5 MCG/ACT AERS Inhale 2 puffs into the lungs daily.   torsemide (DEMADEX) 20 MG tablet Take 1 tablet (20 mg total) by mouth daily.   [DISCONTINUED] isosorbide-hydrALAZINE (BIDIL) 20-37.5 MG tablet Take 1 tablet by mouth 3 (three) times daily.     Allergies:   Patient has no known allergies.   Social History   Socioeconomic History   Marital status: Divorced    Spouse name: Not on file   Number of children: 3   Years of education: Not on file   Highest education level: High school graduate  Occupational History   Occupation: disability  Tobacco Use   Smoking status: Every Day    Packs/day: 1.00    Years: 43.00    Total pack years: 43.00    Types: Cigarettes   Smokeless tobacco: Never   Tobacco comments:    1.5 pack per week-02/03/2022    05/03/2022 0.5 PPD   Vaping Use   Vaping Use: Never used  Substance and Sexual Activity   Alcohol use: No   Drug use: Yes    Frequency: 21.0 times per week    Types: Marijuana, Cocaine    Comment: last use Cocaine- 03/28/2021. Still using marijuana   Sexual activity: Not on file  Other Topics Concern   Not on file  Social History Narrative   ** Merged History Encounter **       Social Determinants of Health   Financial Resource  Strain: Medium Risk (06/19/2021)   Overall Financial Resource Strain (CARDIA)    Difficulty of Paying Living Expenses: Somewhat hard  Food Insecurity: No Food Insecurity (04/20/2022)   Hunger Vital Sign    Worried About Running Out of Food in the Last Year: Never true    Ran Out of Food in the Last Year: Never true  Transportation Needs: No Transportation Needs (04/23/2022)   PRAPARE - Hydrologist (Medical): No    Lack of Transportation (Non-Medical): No  Recent Concern: Transportation Needs - Unmet Transportation Needs (04/20/2022)   PRAPARE - Transportation    Lack of Transportation (Medical): Yes    Lack of Transportation (Non-Medical): Yes  Physical Activity: Insufficiently Active (11/16/2021)   Exercise  Vital Sign    Days of Exercise per Week: 5 days    Minutes of Exercise per Session: 20 min  Stress: Not on file  Social Connections: Moderately Isolated (04/28/2021)   Social Connection and Isolation Panel [NHANES]    Frequency of Communication with Friends and Family: More than three times a week    Frequency of Social Gatherings with Friends and Family: Once a week    Attends Religious Services: More than 4 times per year    Active Member of [employer] or Organizations: No    Attends Archivist Meetings: Never    Marital Status: Divorced     Family History: The patient's family history includes Diabetes in his mother; HIV in his brother; Healthy in his daughter and son; Heart disease in his father.  ROS:   Please see the history of present illness.     All other systems reviewed and are negative.  EKGs/Labs/Other Studies Reviewed:    The following studies were reviewed today:   EKG:  EKG is ordered today.  EKG shows sinus rhythm with PVCs.  Recent Labs: 04/19/2022: ALT 21 04/21/2022: B Natriuretic Peptide 175.1; Hemoglobin 14.2; Platelets 202 04/24/2022: BUN 51; Potassium 4.9; Sodium 138 04/26/2022: Creatinine, Ser 2.08  Recent Lipid  Panel    Component Value Date/Time   CHOL 176 12/25/2019 0632   CHOL 160 11/07/2019 0918   CHOL 157 10/21/2013 0435   TRIG 91 12/25/2019 0632   TRIG 117 10/21/2013 0435   HDL 37 (L) 12/25/2019 0632   HDL 40 11/07/2019 0918   HDL 30 (L) 10/21/2013 0435   CHOLHDL 4.8 12/25/2019 0632   VLDL 18 12/25/2019 0632   VLDL 23 10/21/2013 0435   LDLCALC 121 (H) 12/25/2019 0632   LDLCALC 92 11/07/2019 0918   LDLCALC 104 (H) 10/21/2013 0435     Risk Assessment/Calculations:      Physical Exam:    VS:  BP (!) 140/100 (BP Location: Left Arm, Patient Position: Sitting, Cuff Size: Large)   Pulse 92   Ht 6' (1.829 m)   Wt 257 lb (116.6 kg)   SpO2 97%   BMI 34.86 kg/m     Wt Readings from Last 3 Encounters:  05/03/22 257 lb (116.6 kg)  04/26/22 261 lb 11 oz (118.7 kg)  02/03/22 259 lb (117.5 kg)     GEN:  Well nourished, well developed in no acute distress HEENT: Normal NECK: No JVD; No carotid bruits CARDIAC: RRR, no murmurs, rubs, gallop RESPIRATORY: Decreased breath sounds at bases, expiratory wheezing ABDOMEN: Soft, non-tender, distended MUSCULOSKELETAL:  No edema; No deformity  SKIN: Warm and dry NEUROLOGIC:  Alert and oriented x 3 PSYCHIATRIC:  Normal affect   ASSESSMENT:    1. HFrEF (heart failure with reduced ejection fraction) ([place])   2. LVH (left ventricular hypertrophy)   3. Atrial flutter, unspecified type ([place])   4. Primary hypertension   5. Smoking   6. Chronic combined systolic and diastolic CHF (congestive heart failure) (HCC)    PLAN:    In order of problems listed above:  HFrEF, normalized EF of 50% (initial was 25%), likely nonischemic secondary to cocaine use versus uncontrolled hypertension.   Describes NYHA class II-III symptoms.  Increase BiDil to 2 tab 3 times daily.  Coreg 6.25 mg twice daily, Farxiga.    LVH, severe asymmetric septal hypertrophy on echo, suggesting HCM.  No LVOT obstruction.  Obtain CMR.  Continue Coreg 6.25 mg twice daily.    Atrial flutter, DCCV  06/2020-CHA2DS2-VASc 3.  Maintaining sinus rhythm.  Coreg, Eliquis Hypertension, BP elevated. Increase bidil to 2tabs tid. Continue Coreg 6.64m bid, torsemide. current smoker, cessation advised.    Follow-up in 3 months.   Medication Adjustments/Labs and Tests Ordered: Current medicines are reviewed at length with the patient today.  Concerns regarding medicines are outlined above.  Orders Placed This Encounter  Procedures   MR CARDIAC MORPHOLOGY W WO CONTRAST   Hemoglobin and hematocrit, blood   EKG 12-Lead   Meds ordered this encounter  Medications   isosorbide-hydrALAZINE (BIDIL) 20-37.5 MG tablet    Sig: Take 2 tablets by mouth 3 (three) times daily.    Dispense:  180 tablet    Refill:  1    Patient Instructions  Medication Instructions:   Your physician has recommended you make the following change in your medication:    INCREASE your Isosorbide-Hydralazine (Bidil) 20-37.5 MG to 2 tablets THREE TIMES A DAY.  *If you need a refill on your cardiac medications before your next appointment, please call your pharmacy*   Lab Work:  [facility] the [facility] today for an H/H lab draw.   Testing/Procedures:  Someone will call you to schedule your Cardiac MRI.  [facility] be at:  [facility] [phone] take advantage of the free valet parking available at the MAIN entrance. [facility]first [facility]).  Magnetic resonance imaging (MRI) is a painless test that produces images of the inside of the body without using Xrays.  During an MRI, strong magnets and radio waves work together in a mResearch officer, political partyto form detailed images.   MRI images may provide more details about a medical condition than X-rays, CT scans, and ultrasounds can provide.  You may be given earphones to listen for instructions.  You may eat a light  breakfast and take your regular medications. Please avoid stimulants for 12 hr prior to test. (Ie. Caffeine, nicotine, chocolate, or antihistamine medications)  If a contrast material will be used, an IV will be inserted into one of your veins. Contrast material will be injected into your IV. It will leave your body through your urine within a day. You may be told to drink plenty of fluids to help flush the contrast material out of your system.  You will be asked to remove all metal, including: Watch, jewelry, and other metal objects including hearing aids, hair pieces and dentures. Also wearable glucose monitoring systems (ie. Freestyle Libre and Omnipods) (Braces and fillings normally are not a problem.)   TEST WILL TAKE APPROXIMATELY 1 HOUR  PLEASE NOTIFY SCHEDULING AT LEAST 24 HOURS IN ADVANCE IF YOU ARE UNABLE TO KEEP YOUR APPOINTMENT. [phone] Please call SMarchia Bond cardiac imaging nurse navigator with any questions/concerns. SMarchia BondRN Navigator Cardiac Imaging MGordy ClementRN Navigator Cardiac Imaging [facility] [phone]     Follow-Up: [facility] and your health needs are our priority.  As part of our continuing mission to provide you with exceptional heart care, we have created designated Provider Care Teams.  These Care Teams include your primary Cardiologist (physician) and Advanced Practice Providers (APPs -  Physician Assistants and Nurse Practitioners) who all work together to provide you with the care you need, when you need it.  We recommend signing up for the patient portal called "MyChart".  Sign up information is provided on  this After Visit Summary.  MyChart is used to connect with patients for Virtual Visits (Telemedicine).  Patients are able to view lab/test results, encounter notes, upcoming appointments, etc.  Non-urgent messages can be sent to your provider as well.   To learn more about what you  can do with MyChart, go to [email or web address].    Your next appointment:   3 month(s)  The format for your next appointment:   In Person  Provider:   [name], MD    Other Instructions   Important Information About Sugar         Signed, [name], MD  05/03/2022 10:21 AM    [employer]

## 2022-05-05 ENCOUNTER — Ambulatory Visit: Payer: Medicaid Other | Attending: Family | Admitting: Family

## 2022-05-05 ENCOUNTER — Ambulatory Visit: Payer: Medicaid Other | Admitting: Family

## 2022-05-05 ENCOUNTER — Ambulatory Visit (INDEPENDENT_AMBULATORY_CARE_PROVIDER_SITE_OTHER): Payer: Medicaid Other | Admitting: Urology

## 2022-05-05 ENCOUNTER — Encounter: Payer: Self-pay | Admitting: Urology

## 2022-05-05 ENCOUNTER — Encounter: Payer: Self-pay | Admitting: Family

## 2022-05-05 VITALS — BP 139/70 | HR 103 | Ht 72.0 in | Wt 260.0 lb

## 2022-05-05 VITALS — BP 122/79 | HR 93 | Resp 20 | Wt 256.5 lb

## 2022-05-05 DIAGNOSIS — E1122 Type 2 diabetes mellitus with diabetic chronic kidney disease: Secondary | ICD-10-CM | POA: Insufficient documentation

## 2022-05-05 DIAGNOSIS — N2889 Other specified disorders of kidney and ureter: Secondary | ICD-10-CM

## 2022-05-05 DIAGNOSIS — Z8249 Family history of ischemic heart disease and other diseases of the circulatory system: Secondary | ICD-10-CM | POA: Insufficient documentation

## 2022-05-05 DIAGNOSIS — M109 Gout, unspecified: Secondary | ICD-10-CM | POA: Insufficient documentation

## 2022-05-05 DIAGNOSIS — N189 Chronic kidney disease, unspecified: Secondary | ICD-10-CM | POA: Insufficient documentation

## 2022-05-05 DIAGNOSIS — I5032 Chronic diastolic (congestive) heart failure: Secondary | ICD-10-CM | POA: Diagnosis not present

## 2022-05-05 DIAGNOSIS — I130 Hypertensive heart and chronic kidney disease with heart failure and stage 1 through stage 4 chronic kidney disease, or unspecified chronic kidney disease: Secondary | ICD-10-CM | POA: Insufficient documentation

## 2022-05-05 DIAGNOSIS — Z833 Family history of diabetes mellitus: Secondary | ICD-10-CM | POA: Insufficient documentation

## 2022-05-05 DIAGNOSIS — F17210 Nicotine dependence, cigarettes, uncomplicated: Secondary | ICD-10-CM | POA: Insufficient documentation

## 2022-05-05 DIAGNOSIS — J449 Chronic obstructive pulmonary disease, unspecified: Secondary | ICD-10-CM | POA: Diagnosis not present

## 2022-05-05 DIAGNOSIS — Z7984 Long term (current) use of oral hypoglycemic drugs: Secondary | ICD-10-CM | POA: Insufficient documentation

## 2022-05-05 DIAGNOSIS — I2510 Atherosclerotic heart disease of native coronary artery without angina pectoris: Secondary | ICD-10-CM | POA: Insufficient documentation

## 2022-05-05 DIAGNOSIS — F17200 Nicotine dependence, unspecified, uncomplicated: Secondary | ICD-10-CM | POA: Diagnosis not present

## 2022-05-05 DIAGNOSIS — I10 Essential (primary) hypertension: Secondary | ICD-10-CM

## 2022-05-05 NOTE — Progress Notes (Signed)
05/05/2022 8:41 AM   Chris [name] 10/06/1960 [id number]  Referring provider: [name], MD [place],  [place] [address]  Chief Complaint  Patient presents with   Other    HPI: Chris [name] is a 61 y.o. male referred for evaluation of a right renal mass.  Hospitalized 04/19/2022 for acute respiratory failure/COPD exacerbation.  Other medical comorbidities include chronic diastolic CHF, diabetes with stage III CKD, history cocaine abuse. Abdominal ultrasound performed during his hospitalization which incidentally showed a 2.9 x 2.8 x 2.7 cm solid renal mass versus lobulation.  Subsequent renal mass protocol MRI was performed which showed a 2.8 x 2.5 cm enhancing renal mass No bothersome LUTS or gross hematuria   PMH: Past Medical History:  Diagnosis Date   Arrhythmia    atrial flutter   CHF (congestive heart failure) (HCC)    Chronic kidney disease    COPD (chronic obstructive pulmonary disease) (HCC)    Coronary artery disease    Depression    Diabetes mellitus without complication (HCC)    GERD (gastroesophageal reflux disease)    Gout    Hypertension    Influenza A with respiratory manifestations    Mental disorder     Surgical History: Past Surgical History:  Procedure Laterality Date   ANKLE SURGERY     CARDIAC CATHETERIZATION     CARDIOVERSION N/A 07/08/2020   Procedure: CARDIOVERSION;  Surgeon: Corey Skains, MD;  Location: [facility];  Service: Cardiovascular;  Laterality: N/A;   COLONOSCOPY WITH PROPOFOL N/A 11/19/2021   Procedure: COLONOSCOPY WITH PROPOFOL;  Surgeon: Daryel November, MD;  Location: [facility];  Service: Gastroenterology;  Laterality: N/A;   HERNIA REPAIR     x2   POLYPECTOMY  11/19/2021   Procedure: POLYPECTOMY;  Surgeon: Daryel November, MD;  Location: [facility];  Service: Gastroenterology;;   SHOULDER SURGERY     TEE WITHOUT CARDIOVERSION N/A 07/08/2020   Procedure: TRANSESOPHAGEAL  ECHOCARDIOGRAM (TEE);  Surgeon: Corey Skains, MD;  Location: [facility];  Service: Cardiovascular;  Laterality: N/A;    Home Medications:  Allergies as of 05/05/2022   No Known Allergies      Medication List        Accurate as of May 05, 2022  8:41 AM. If you have any questions, ask your nurse or doctor.          Accu-Chek Guide test strip Generic drug: glucose blood Use as directed to check blood sugar 1-2 times a day   Accu-Chek Guide w/Device Kit Use as directed   Accu-Chek Softclix Lancets lancets Use as instructed   acidophilus Caps capsule Take 2 capsules by mouth daily.   albuterol 108 (90 Base) MCG/ACT inhaler Commonly known as: Ventolin HFA Inhale 2 puffs into the lungs every 6 (six) hours as needed for wheezing or shortness of breath.   albuterol (2.5 MG/3ML) 0.083% nebulizer solution Commonly known as: PROVENTIL Take 3 mLs (2.5 mg total) by nebulization every 4 (four) hours as needed for wheezing or shortness of breath.   allopurinol 100 MG tablet Commonly known as: ZYLOPRIM TAKE 2 TABLETS (200 MG TOTAL) BY MOUTH DAILY.   aspirin EC 81 MG tablet Take 1 tablet (81 mg total) by mouth daily.   atorvastatin 40 MG tablet Commonly known as: LIPITOR Take 1 tablet (40 mg total) by mouth daily.   carvedilol 6.25 MG tablet Commonly known as: COREG Take 1 tablet (6.25 mg total) by mouth 2 (two)  times daily.   chlorpheniramine-HYDROcodone 10-8 MG/5ML Commonly known as: TUSSIONEX Take 5 mLs by mouth every 12 (twelve) hours as needed for cough.   Farxiga 10 MG Tabs tablet Generic drug: dapagliflozin propanediol Take 1 tablet (10 mg total) by mouth daily.   FT Mucus Relief 12HR 600 MG 12 hr tablet Generic drug: guaiFENesin Take 1 tablet (600 mg total) by mouth 2 (two) times daily for 7 days.   isosorbide-hydrALAZINE 20-37.5 MG tablet Commonly known as: BiDil Take 2 tablets by mouth 3 (three) times daily.   montelukast 10 MG tablet Commonly  known as: SINGULAIR TAKE 1 TABLET (10 MG TOTAL) BY MOUTH AT BEDTIME.   oxyCODONE-acetaminophen 7.5-325 MG tablet Commonly known as: PERCOCET Take 1 tablet by mouth every 6 (six) hours as needed for moderate pain.   Spiriva Respimat 2.5 MCG/ACT Aers Generic drug: Tiotropium Bromide Monohydrate Inhale 2 puffs into the lungs daily.   Symbicort 160-4.5 MCG/ACT inhaler Generic drug: budesonide-formoterol Inhale 2 puffs into the lungs 2 (two) times daily.   torsemide 20 MG tablet Commonly known as: DEMADEX Take 1 tablet (20 mg total) by mouth daily.        Allergies: No Known Allergies  Family History: Family History  Problem Relation Age of Onset   Heart disease Father    Diabetes Mother    HIV Brother    Healthy Son    Healthy Daughter     Social History:  reports that he has been smoking cigarettes. He has a 43.00 pack-year smoking history. He has never used smokeless tobacco. He reports current drug use. Frequency: 21.00 times per week. Drugs: Marijuana and Cocaine. He reports that he does not drink alcohol.   Physical Exam: BP 139/70   Pulse (!) 103   Ht 6' (1.829 m)   Wt 260 lb (117.9 kg)   BMI 35.26 kg/m   Constitutional:  Alert and oriented, No acute distress. HEENT: [place] AT Respiratory: Normal respiratory effort, no increased work of breathing. Psychiatric: Normal mood and affect.   Pertinent Imaging: MRI images were personally reviewed and interpreted.  The mass appears to be present on a chest CT November 2022   Assessment & Plan:    1.  Right renal mass A solid renal mass raises the suspicion of primary renal malignancy.  We discussed this in detail and in regards to the spectrum of renal masses which includes cysts (pure cysts are considered benign), solid masses and everything in between. The risk of metastasis increases as the size of solid renal mass increases. In general, it is believed that the risk of metastasis for renal masses less than 3-4 cm is  small (up to approximately 5%) based mainly on large retrospective studies. In some cases and especially in patients of older age and multiple comorbidities a surveillance approach may be appropriate. The treatment of solid renal masses includes: surveillance, cryoablation (percutaneous and laparoscopic) in addition to partial and complete nephrectomy (each with option of laparoscopic, robotic and open depending on appropriateness). Furthermore, nephrectomy appears to be an independent risk factor for the development of chronic kidney disease suggesting that nephron sparing approaches should be implored whenever feasible. We reviewed these options in context of the patients current situation as well as the pros and cons of each. He was interested in pursuing percutaneous ablation by IR and referral placed to see if he is a candidate   Abbie Sons, [place] [address] [place], [place] [address] [phone]  [phone]

## 2022-05-05 NOTE — Patient Instructions (Addendum)
Resume weighing daily and call for an overnight weight gain of 3 pounds or more or a weekly weight gain of more than 5 pounds.   If you have voicemail, please make sure your mailbox is cleaned out so that we may leave a message and please make sure to listen to any voicemails.     

## 2022-05-05 NOTE — Progress Notes (Signed)
Patient ID: Chris [name], male    DOB: Jun 12, 1961, 61 y.o.   MRN: [id number]  HPI  Chris [name] is a 61 y/o male with a history of CAD, DM, HTN, CKD, gout, depression, atrial flutter, COPD, GERD, current tobacco use and chronic heart failure.   Echo report from 8/15//23 showed an EF of 50% along with moderate LVH and mild/moderate LAE. EF much improved from previous 30-35%  Admitted 04/19/22 due to acute onset of worsening shortness of breath over the last 4 days with associated cough as well as nausea and vomiting. Initially given IV lasix with transition to oral medications. Pulmonology consult obtained. Given IV solu-medrol. RSV +. Weaned off oxygen to room air. Discharged after 7 days. Was in the ED 03/22/22 but LWBS.   Chris [name] presents today for a follow-up visit although hasn't been seen since June 2022. Chris [name] presents with a chief complaint of minimal fatigue upon moderate exertion. Describes this as chronic. Chris [name] has associated cough and shortness of breath along with this. Chris [name] denies any dizziness, difficulty sleeping, abdominal distention, palpitations, pedal edema, chest pain or weight gain.   Hasn't been weighing daily but does have working scales. Says that Chris [name] may be getting a sleep study done as Chris [name] wore CPAP in the past.   Past Medical History:  Diagnosis Date   Arrhythmia    atrial flutter   CHF (congestive heart failure) (HCC)    Chronic kidney disease    COPD (chronic obstructive pulmonary disease) ([place])    Coronary artery disease    Depression    Diabetes mellitus without complication ([place])    GERD (gastroesophageal reflux disease)    Gout    Hypertension    Influenza A with respiratory manifestations    Mental disorder    Past Surgical History:  Procedure Laterality Date   ANKLE SURGERY     CARDIAC CATHETERIZATION     CARDIOVERSION N/A 07/08/2020   Procedure: CARDIOVERSION;  Surgeon: Corey Skains, MD;  Location: [facility] ORS;  Service: Cardiovascular;  Laterality: N/A;    COLONOSCOPY WITH PROPOFOL N/A 11/19/2021   Procedure: COLONOSCOPY WITH PROPOFOL;  Surgeon: Daryel November, MD;  Location: [facility];  Service: Gastroenterology;  Laterality: N/A;   HERNIA REPAIR     x2   POLYPECTOMY  11/19/2021   Procedure: POLYPECTOMY;  Surgeon: Daryel November, MD;  Location: [facility];  Service: Gastroenterology;;   SHOULDER SURGERY     TEE WITHOUT CARDIOVERSION N/A 07/08/2020   Procedure: TRANSESOPHAGEAL ECHOCARDIOGRAM (TEE);  Surgeon: Corey Skains, MD;  Location: [facility];  Service: Cardiovascular;  Laterality: N/A;   Family History  Problem Relation Age of Onset   Heart disease Father    Diabetes Mother    HIV Brother    Healthy Son    Healthy Daughter    Social History   Tobacco Use   Smoking status: Every Day    Packs/day: 1.00    Years: 43.00    Total pack years: 43.00    Types: Cigarettes   Smokeless tobacco: Never   Tobacco comments:    1.5 pack per week-02/03/2022    05/03/2022 0.5 PPD   Substance Use Topics   Alcohol use: No   No Known Allergies  Prior to Admission medications   Medication Sig Start Date End Date Taking? Authorizing Provider  Accu-Chek Softclix Lancets lancets Use as instructed 04/28/22  Yes [name], MD  albuterol (PROVENTIL) (2.5 MG/3ML) 0.083% nebulizer solution Take 3 mLs (2.5 mg  total) by nebulization every 4 (four) hours as needed for wheezing or shortness of breath. 04/26/22 05/26/22 Yes Wieting, Richard, MD  albuterol (VENTOLIN HFA) 108 (90 Base) MCG/ACT inhaler Inhale 2 puffs into the lungs every 6 (six) hours as needed for wheezing or shortness of breath. 04/26/22  Yes Wieting, Richard, MD  allopurinol (ZYLOPRIM) 100 MG tablet TAKE 2 TABLETS (200 MG TOTAL) BY MOUTH DAILY. 04/26/22  Yes [name], MD  aspirin EC 81 MG tablet Take 1 tablet (81 mg total) by mouth daily. 04/26/22  Yes Wieting, Richard, MD  atorvastatin (LIPITOR) 40 MG tablet Take 1 tablet (40 mg total) by mouth daily.  04/26/22  Yes [name], MD  Blood Glucose Monitoring Suppl (ACCU-CHEK GUIDE) w/Device KIT Use as directed 04/28/22  Yes [name], MD  budesonide-formoterol [facility]) 160-4.5 MCG/ACT inhaler Inhale 2 puffs into the lungs 2 (two) times daily. 04/26/22  Yes Wieting, Richard, MD  carvedilol (COREG) 6.25 MG tablet Take 1 tablet (6.25 mg total) by mouth 2 (two) times daily. 04/26/22  Yes Wieting, Richard, MD  chlorpheniramine-HYDROcodone (TUSSIONEX) 10-8 MG/5ML Take 5 mLs by mouth every 12 (twelve) hours as needed for cough. 04/26/22  Yes Wieting, Richard, MD  dapagliflozin propanediol (FARXIGA) 10 MG TABS tablet Take 1 tablet (10 mg total) by mouth daily. 04/26/22  Yes Wieting, Richard, MD  glucose blood (ACCU-CHEK GUIDE) test strip Use as directed to check blood sugar 1-2 times a day 04/28/22  Yes [name], MD  guaiFENesin (MUCINEX) 600 MG 12 hr tablet Take 1 tablet (600 mg total) by mouth 2 (two) times daily for 7 days. 04/26/22 05/06/22 Yes Wieting, Richard, MD  isosorbide-hydrALAZINE (BIDIL) 20-37.5 MG tablet Take 2 tablets by mouth 3 (three) times daily. 05/03/22  Yes Agbor-Etang, Aaron Edelman, MD  montelukast (SINGULAIR) 10 MG tablet TAKE 1 TABLET (10 MG TOTAL) BY MOUTH AT BEDTIME. 04/26/22 04/26/23 Yes Wieting, Richard, MD  Tiotropium Bromide Monohydrate (SPIRIVA RESPIMAT) 2.5 MCG/ACT AERS Inhale 2 puffs into the lungs daily. 04/26/22  Yes Wieting, Richard, MD  torsemide (DEMADEX) 20 MG tablet Take 1 tablet (20 mg total) by mouth daily. 04/26/22  Yes Wieting, Richard, MD  metoprolol tartrate (LOPRESSOR) 100 MG tablet Take 1 tablet (100 mg total) by mouth 2 (two) times daily. 07/09/20 07/11/20  [name], MD   Review of Systems  Constitutional:  Positive for fatigue (at times). Negative for appetite change.  HENT:  Negative for congestion, postnasal drip and sore throat.   Eyes: Negative.   Respiratory:  Positive for cough (dry) and shortness of breath (improving). Negative  for chest tightness.   Cardiovascular:  Negative for chest pain, palpitations and leg swelling.  Gastrointestinal:  Negative for abdominal distention and abdominal pain.  Endocrine: Negative.   Genitourinary: Negative.   Musculoskeletal:  Negative for back pain and neck pain.  Skin: Negative.   Allergic/Immunologic: Negative.   Neurological:  Negative for dizziness and light-headedness.  Hematological:  Negative for adenopathy. Does not bruise/bleed easily.  Psychiatric/Behavioral:  Negative for dysphoric mood and sleep disturbance (no cpap). The patient is not nervous/anxious.    Vitals:   05/05/22 1358  BP: 122/79  Pulse: 93  Resp: 20  SpO2: 98%  Weight: 256 lb 8 oz (116.3 kg)   Wt Readings from Last 3 Encounters:  05/05/22 256 lb 8 oz (116.3 kg)  05/05/22 260 lb (117.9 kg)  05/03/22 257 lb (116.6 kg)   Lab Results  Component Value Date   CREATININE 2.08 (H) 04/26/2022  CREATININE 1.93 (H) 04/24/2022   CREATININE 1.92 (H) 04/22/2022   Physical Exam Vitals and nursing note reviewed.  Constitutional:      Appearance: Normal appearance.  HENT:     Head: Normocephalic and atraumatic.  Cardiovascular:     Rate and Rhythm: Normal rate and regular rhythm.  Pulmonary:     Effort: Pulmonary effort is normal. No respiratory distress.     Breath sounds: No wheezing or rales.  Abdominal:     General: There is no distension.     Palpations: Abdomen is soft.  Musculoskeletal:        General: No tenderness.     Cervical back: Normal range of motion and neck supple.     Right lower leg: No edema.     Left lower leg: No edema.  Skin:    General: Skin is warm and dry.  Neurological:     General: No focal deficit present.     Mental Status: Chris [name] is alert and oriented to person, place, and time.  Psychiatric:        Mood and Affect: Mood is anxious.        Behavior: Behavior normal.        Thought Content: Thought content normal.    Assessment & Plan:  1: Chronic heart  failure with now preserved ejection fraction with structural changes (LVH)- - NYHA class II - euvolemic today - not weighing daily but does have scales; emphasized weighing daily so that Chris [name] can call for an overnight weight gain of > 2 pounds or a weekly weight gain of > 5 pounds - weight down 23 pounds from last visit here >1 year ago - saw cardiology (Agbor-Etang) 05/03/22 - on GDMT of carvedilol and farxiga - consider entresto - BNP 04/21/22 was 175.1  2: HTN- - BP looks good (122/79) - saw PCP Wynetta Emery) 12/21/21 - BMP 04/24/22 reviewed and showed sodium 138, potassium 4.9, creatinine 1.93 and GFR 39 - saw nephrology Holley Raring) 11/11/20  3: COPD- - saw pulmonology (Dgayli) 02/03/22  4: Tobacco use- - smoking 1 pack of cigarettes/ week - smokes marijuana - denies using alcohol - + for cocaine in urine on 04/20/22 - denies any cocaine use but 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  Return in 6 weeks, sooner if needed.

## 2022-05-10 ENCOUNTER — Other Ambulatory Visit: Payer: Self-pay | Admitting: Urology

## 2022-05-10 DIAGNOSIS — N2889 Other specified disorders of kidney and ureter: Secondary | ICD-10-CM

## 2022-05-11 ENCOUNTER — Other Ambulatory Visit: Payer: Self-pay | Admitting: Internal Medicine

## 2022-05-11 ENCOUNTER — Encounter: Payer: Self-pay | Admitting: Internal Medicine

## 2022-05-11 ENCOUNTER — Ambulatory Visit: Payer: Medicaid Other | Attending: Internal Medicine | Admitting: Internal Medicine

## 2022-05-11 ENCOUNTER — Telehealth: Payer: Self-pay | Admitting: Family Medicine

## 2022-05-11 ENCOUNTER — Other Ambulatory Visit: Payer: Self-pay

## 2022-05-11 VITALS — BP 131/84 | HR 81 | Temp 97.6°F | Ht 72.0 in | Wt 254.0 lb

## 2022-05-11 DIAGNOSIS — Z09 Encounter for follow-up examination after completed treatment for conditions other than malignant neoplasm: Secondary | ICD-10-CM

## 2022-05-11 DIAGNOSIS — M545 Low back pain, unspecified: Secondary | ICD-10-CM

## 2022-05-11 DIAGNOSIS — J209 Acute bronchitis, unspecified: Secondary | ICD-10-CM

## 2022-05-11 DIAGNOSIS — G8929 Other chronic pain: Secondary | ICD-10-CM

## 2022-05-11 DIAGNOSIS — E669 Obesity, unspecified: Secondary | ICD-10-CM

## 2022-05-11 DIAGNOSIS — I5032 Chronic diastolic (congestive) heart failure: Secondary | ICD-10-CM | POA: Diagnosis not present

## 2022-05-11 DIAGNOSIS — Z23 Encounter for immunization: Secondary | ICD-10-CM

## 2022-05-11 DIAGNOSIS — N1832 Chronic kidney disease, stage 3b: Secondary | ICD-10-CM

## 2022-05-11 DIAGNOSIS — F1490 Cocaine use, unspecified, uncomplicated: Secondary | ICD-10-CM

## 2022-05-11 DIAGNOSIS — E1169 Type 2 diabetes mellitus with other specified complication: Secondary | ICD-10-CM

## 2022-05-11 DIAGNOSIS — J439 Emphysema, unspecified: Secondary | ICD-10-CM | POA: Diagnosis not present

## 2022-05-11 DIAGNOSIS — E1159 Type 2 diabetes mellitus with other circulatory complications: Secondary | ICD-10-CM

## 2022-05-11 DIAGNOSIS — N2889 Other specified disorders of kidney and ureter: Secondary | ICD-10-CM

## 2022-05-11 DIAGNOSIS — M25511 Pain in right shoulder: Secondary | ICD-10-CM

## 2022-05-11 DIAGNOSIS — I152 Hypertension secondary to endocrine disorders: Secondary | ICD-10-CM

## 2022-05-11 DIAGNOSIS — M109 Gout, unspecified: Secondary | ICD-10-CM

## 2022-05-11 DIAGNOSIS — F320 Major depressive disorder, single episode, mild: Secondary | ICD-10-CM

## 2022-05-11 MED ORDER — DULOXETINE HCL 20 MG PO CPEP
20.0000 mg | ORAL_CAPSULE | Freq: Every day | ORAL | 3 refills | Status: DC
  Filled 2022-05-11 – 2022-08-05 (×2): qty 30, 30d supply, fill #0

## 2022-05-11 NOTE — Telephone Encounter (Signed)
Pt called asking for the medication that he takes for gout.  Outpatient Pharmacy at [facility].  CB#  [phone]
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
## 2022-05-11 NOTE — Telephone Encounter (Signed)
Vickie has left a voicemail for patient to return her call to schedule appointment for Cryoablation consult.  2.8 cm right renal mass.  Interested in cryoablation by IR.  IR referral to see if he is a candidate for cryoablation

## 2022-05-12 ENCOUNTER — Ambulatory Visit (INDEPENDENT_AMBULATORY_CARE_PROVIDER_SITE_OTHER): Payer: Medicaid Other | Admitting: Student in an Organized Health Care Education/Training Program

## 2022-05-12 ENCOUNTER — Other Ambulatory Visit: Payer: Self-pay

## 2022-05-12 ENCOUNTER — Encounter: Payer: Self-pay | Admitting: Student in an Organized Health Care Education/Training Program

## 2022-05-12 VITALS — BP 124/74 | HR 78 | Temp 97.7°F | Ht 72.0 in | Wt 253.0 lb

## 2022-05-12 DIAGNOSIS — F17200 Nicotine dependence, unspecified, uncomplicated: Secondary | ICD-10-CM

## 2022-05-12 DIAGNOSIS — J449 Chronic obstructive pulmonary disease, unspecified: Secondary | ICD-10-CM | POA: Diagnosis not present

## 2022-05-12 DIAGNOSIS — J121 Respiratory syncytial virus pneumonia: Secondary | ICD-10-CM | POA: Diagnosis not present

## 2022-05-12 MED ORDER — NICOTINE POLACRILEX 2 MG MT LOZG
2.0000 mg | LOZENGE | OROMUCOSAL | 3 refills | Status: DC | PRN
  Filled 2022-05-12: qty 72, 6d supply, fill #0

## 2022-05-12 MED ORDER — ALLOPURINOL 100 MG PO TABS
ORAL_TABLET | Freq: Every day | ORAL | 0 refills | Status: DC
  Filled 2022-05-12: qty 180, fill #0
  Filled 2022-07-09: qty 180, 90d supply, fill #0

## 2022-05-12 NOTE — Telephone Encounter (Signed)
Patient has been scheduled for  a telephone consult.

## 2022-05-12 NOTE — Telephone Encounter (Signed)
Requested medication (s) are due for refill today:routing for review  Requested medication (s) are on the active medication list: yes Last refill:  04/26/22  Future visit scheduled: yes  Notes to clinic:  Unable to refill per protocol, last refill by another provider.      Requested Prescriptions  Pending Prescriptions Disp Refills   allopurinol (ZYLOPRIM) 100 MG tablet 60 tablet 0    Sig: TAKE 2 TABLETS (200 MG TOTAL) BY MOUTH DAILY.     Endocrinology:  Gout Agents - allopurinol Failed - 05/11/2022  5:25 PM      Failed - Uric Acid in normal range and within 360 days    Uric Acid, Serum  Date Value Ref Range Status  01/14/2020 9.1 (H) 4.0 - 8.0 mg/dL Final    Comment:    Therapeutic target for gout patients: <6.0 mg/dL .    Uric Acid  Date Value Ref Range Status  11/30/2018 7.5 3.7 - 8.6 mg/dL Final    Comment:               Therapeutic target for gout patients: <6.0         Failed - Cr in normal range and within 360 days    Creat  Date Value Ref Range Status  01/14/2020 1.97 (H) 0.70 - 1.33 mg/dL Final    Comment:    For patients >17 years of age, the reference limit for Creatinine is approximately 13% higher for people identified as African-American. .    Creatinine, Ser  Date Value Ref Range Status  04/26/2022 2.08 (H) 0.61 - 1.24 mg/dL Final         Passed - Valid encounter within last 12 months    Recent Outpatient Visits           [facility] discharge follow-up   [place] Karle Plumber B, MD   4 months ago Type 2 diabetes mellitus with obesity [facility]   [place] Karle Plumber B, MD   8 months ago Type 2 diabetes mellitus with obesity [facility])   [place], MD   10 months ago SOB (shortness of breath)   [place], Enobong, MD   12 months ago Hospital discharge follow-up   [place], MD       Future Appointments             In 2 months Agbor-Etang, Aaron Edelman, MD [place]. [place]   In 2 months [name], MD [place] within normal limits and completed in the last 12 months    WBC  Date Value Ref Range Status  04/21/2022 12.2 (H) 4.0 - 10.5 K/uL Final   RBC  Date Value Ref Range Status  04/21/2022 4.82 4.22 - 5.81 MIL/uL Final   Hemoglobin  Date Value Ref Range Status  05/03/2022 15.8 13.0 - 17.0 g/dL Final  11/07/2019 15.4 13.0 - 17.7 g/dL Final   HCT  Date Value Ref Range Status  05/03/2022 49.3 39.0 - 52.0 % Final    Comment:    Performed at [facility], [address], [place], [place] [address]   Hematocrit  Date Value Ref Range Status  11/07/2019 46.2 37.5 -  51.0 % Final   MCHC  Date Value Ref Range Status  04/21/2022 32.7 30.0 - 36.0 g/dL Final   [facility]  Date Value Ref Range Status  04/21/2022 29.5 26.0 - 34.0 pg Final   MCV  Date Value Ref Range Status  04/21/2022 90.0 80.0 - 100.0 fL Final  11/07/2019 90 79 - 97 fL Final  10/21/2013 92 80 - 100 fL Final   No results found for: "PLTCOUNTKUC", "LABPLAT", "POCPLA" RDW  Date Value Ref Range Status  04/21/2022 14.8 11.5 - 15.5 % Final  11/07/2019 14.2 11.6 - 15.4 % Final  10/21/2013 15.7 (H) 11.5 - 14.5 % Final

## 2022-05-12 NOTE — Progress Notes (Signed)
Synopsis: Post hospital discharge follow up.  Assessment & Plan:   #Shortness of Breath #COPD   Chris [name] is a pleasant 61 year old male patient with a significant smoking history presenting with shortness of breath, cough, and wheeze suggestive of COPD. He was recently admitted to the hospital with COPD exacerbation secondary to RSV and was treated with steroids, antibiotics, and BiPAP.  During his previous clinic visit, We switched his inhalers and started him on Triple therapy (ICS/LABA/LAMA). His insurance would not cover trelegy so we started Spiriva and Symbicort. He will schedule his pulmonary function test and have it done prior to his next follow up.  I have reviewed his previous imaging in our record, including a CT scan of the chest from November 2022.  This scan does not show any signs of emphysema and rather suggests some airway thickening; for this reason I did not be sending alpha-1 levels. Given this, I suspect he has more of a chronic bronchitis type picture versus a COPD/asthma overlap. The latter is supported by an eosinophil value of 700 in May 2022. He will benefit from a higher dose ICS.   Finally, I counseled [name] extensively about the need to quit smoking and limit his inhalational exposure.  This is going to be of utmost importance to control his symptoms.   -PFT's -Continue Symbicort and Spiriva -Switch albuterol neb to duo-neb   #Tobacco Dependence   I counseled [name] extensively about the need and importance of stopping smoking.  I also discussed with him his cocaine use.  I discussed with him at length the dangers of inhalational exposure to the lung and damage that it could precipitate. He is interested in nicotine lozenges to help with tobacco cessation.   - nicotine polacrilex (NICOTINE MINI) 2 MG lozenge; Take 1 lozenge (2 mg total) by mouth every 2 (two) hours as needed for smoking cessation.  Dispense: 72 lozenge; Refill: 3   Return in  about 6 months (around 11/10/2022).  I spent 30 minutes caring for this patient today, including preparing to see the patient, obtaining a medical history , reviewing a separately obtained history, performing a medically appropriate examination and/or evaluation, counseling and educating the patient/family/caregiver, ordering medications, tests, or procedures, and documenting clinical information in the electronic health record  Armando Reichert, MD [facility] 05/12/2022 10:43 AM    End of visit medications:  Meds ordered this encounter  Medications   nicotine polacrilex (NICOTINE MINI) 2 MG lozenge    Sig: Take 1 lozenge (2 mg total) by mouth every 2 (two) hours as needed for smoking cessation.    Dispense:  72 lozenge    Refill:  3     Current Outpatient Medications:    nicotine polacrilex (NICOTINE MINI) 2 MG lozenge, Take 1 lozenge (2 mg total) by mouth every 2 (two) hours as needed for smoking cessation., Disp: 72 lozenge, Rfl: 3   Accu-Chek Softclix Lancets lancets, Use as instructed, Disp: 100 each, Rfl: 12   albuterol (PROVENTIL) (2.5 MG/3ML) 0.083% nebulizer solution, Take 3 mLs (2.5 mg total) by nebulization every 4 (four) hours as needed for wheezing or shortness of breath., Disp: 300 mL, Rfl: 0   albuterol (VENTOLIN HFA) 108 (90 Base) MCG/ACT inhaler, Inhale 2 puffs into the lungs every 6 (six) hours as needed for wheezing or shortness of breath., Disp: 6.7 g, Rfl: 0   allopurinol (ZYLOPRIM) 100 MG tablet, TAKE 2 TABLETS (200 MG TOTAL) BY MOUTH DAILY., Disp: 180 tablet,  Rfl: 0   aspirin EC 81 MG tablet, Take 1 tablet (81 mg total) by mouth daily., Disp: 30 tablet, Rfl: 0   atorvastatin (LIPITOR) 40 MG tablet, Take 1 tablet (40 mg total) by mouth daily., Disp: 30 tablet, Rfl: 0   Blood Glucose Monitoring Suppl (ACCU-CHEK GUIDE) w/Device KIT, Use as directed, Disp: 1 kit, Rfl: 0   budesonide-formoterol (SYMBICORT) 160-4.5 MCG/ACT inhaler, Inhale 2 puffs into  the lungs 2 (two) times daily., Disp: 10.2 g, Rfl: 0   carvedilol (COREG) 6.25 MG tablet, Take 1 tablet (6.25 mg total) by mouth 2 (two) times daily., Disp: 60 tablet, Rfl: 0   chlorpheniramine-HYDROcodone (TUSSIONEX) 10-8 MG/5ML, Take 5 mLs by mouth every 12 (twelve) hours as needed for cough., Disp: 70 mL, Rfl: 0   dapagliflozin propanediol (FARXIGA) 10 MG TABS tablet, Take 1 tablet (10 mg total) by mouth daily., Disp: 30 tablet, Rfl: 0   DULoxetine (CYMBALTA) 20 MG capsule, Take 1 capsule (20 mg total) by mouth daily., Disp: 30 capsule, Rfl: 3   glucose blood (ACCU-CHEK GUIDE) test strip, Use as directed to check blood sugar 1-2 times a day, Disp: 100 each, Rfl: 12   isosorbide-hydrALAZINE (BIDIL) 20-37.5 MG tablet, Take 2 tablets by mouth 3 (three) times daily., Disp: 180 tablet, Rfl: 1   montelukast (SINGULAIR) 10 MG tablet, TAKE 1 TABLET (10 MG TOTAL) BY MOUTH AT BEDTIME., Disp: 30 tablet, Rfl: 0   Tiotropium Bromide Monohydrate (SPIRIVA RESPIMAT) 2.5 MCG/ACT AERS, Inhale 2 puffs into the lungs daily., Disp: 4 g, Rfl: 0   torsemide (DEMADEX) 20 MG tablet, Take 1 tablet (20 mg total) by mouth daily., Disp: 30 tablet, Rfl: 0   Subjective:   PATIENT ID: Chris [name] GENDER: male DOB: [birth year], MRN: [id number]  Chief Complaint  Patient presents with   Hospitalization Follow-up    Breathing has improved since d/c. SOB with exertion and prod cough with white sputum.     HPI  Chris [name] is a pleasant 61 year old male presenting to clinic for follow up. I had last seen him in August for cough. He was recently admitted to [facility] with RSV and COPD exacerbation.   He developed symptoms of an URTI around 4 to 5 days prior to his presentation to the hospital. He was placed on BiPAP on presentation with good result secondary to work of breathing. He was admitted and managed for COPD exacerbation secondary to RSV infection as well as decompensated heart failure. His toxicology screen was also  noted to be positive for cocaine during said admission. He improved and was subsequently discharged.   During our prior visit, he had reported symptoms of shortness of breath for 3 to 4 years. I started him on LABA/LAMA/ICS therapy and he was to get pulmonary function testing done, which he hasn't done yet.   He is a lifelong smoker and had a some point in his life smoked 2 packs a day.  He currently reports that he smokes a cigarette or two a day. He feels significantly better when he doesn't smoke. He likely has more than 45 pack years of smoking history. He also reports currently smoking marijuana (smokes flower, rolls into a joint, does not use filters). He has a history of smoking crack cocaine, but tells me that his most recent use was intranasal. He does not endorse any history of lung disease growing up and denies any history of asthma.  He was athletic in his teens and 58s.  He does  not have any family history of lung diseases.  He tells me that he was previously incarcerated and had been released on parole 3 years ago.  He does not have any pets, denies having any birds, and reports some mildew in the bathroom of his house but no other mold damage.  Ancillary information including prior medications, full medical/surgical/family/social histories, and PFTs (when available) are listed below and have been reviewed.   ROS   Objective:   Vitals:   05/12/22 1027 05/12/22 1029  BP: 124/74 124/74  Pulse: 78 78  Temp: 97.7 F (36.5 C) 97.7 F (36.5 C)  TempSrc: Temporal Temporal  SpO2: 97% 97%  Weight: 253 lb 6.4 oz (114.9 kg) 253 lb (114.8 kg)  Height: 6' (1.829 m) 6' (1.829 m)   97% on RA  BMI Readings from Last 3 Encounters:  05/12/22 34.31 kg/m  05/11/22 34.45 kg/m  05/05/22 34.79 kg/m   Wt Readings from Last 3 Encounters:  05/12/22 253 lb (114.8 kg)  05/11/22 254 lb (115.2 kg)  05/05/22 256 lb 8 oz (116.3 kg)    Physical Exam    Ancillary Information    Past  Medical History:  Diagnosis Date   Arrhythmia    atrial flutter   CHF (congestive heart failure) (HCC)    Chronic kidney disease    COPD (chronic obstructive pulmonary disease) (HCC)    Coronary artery disease    Depression    Diabetes mellitus without complication (HCC)    GERD (gastroesophageal reflux disease)    Gout    Hypertension    Influenza A with respiratory manifestations    Mental disorder      Family History  Problem Relation Age of Onset   Heart disease Father    Diabetes Mother    HIV Brother    Healthy Son    Healthy Daughter      Past Surgical History:  Procedure Laterality Date   ANKLE SURGERY     CARDIAC CATHETERIZATION     CARDIOVERSION N/A 07/08/2020   Procedure: CARDIOVERSION;  Surgeon: Corey Skains, MD;  Location: [facility];  Service: Cardiovascular;  Laterality: N/A;   COLONOSCOPY WITH PROPOFOL N/A 11/19/2021   Procedure: COLONOSCOPY WITH PROPOFOL;  Surgeon: Daryel November, MD;  Location: [facility];  Service: Gastroenterology;  Laterality: N/A;   HERNIA REPAIR     x2   POLYPECTOMY  11/19/2021   Procedure: POLYPECTOMY;  Surgeon: Daryel November, MD;  Location: [facility];  Service: Gastroenterology;;   SHOULDER SURGERY     TEE WITHOUT CARDIOVERSION N/A 07/08/2020   Procedure: TRANSESOPHAGEAL ECHOCARDIOGRAM (TEE);  Surgeon: Corey Skains, MD;  Location: [facility];  Service: Cardiovascular;  Laterality: N/A;    Social History   Socioeconomic History   Marital status: Divorced    Spouse name: Not on file   Number of children: 3   Years of education: Not on file   Highest education level: High school graduate  Occupational History   Occupation: disability  Tobacco Use   Smoking status: Every Day    Packs/day: 1.00    Years: 43.00    Total pack years: 43.00    Types: Cigarettes   Smokeless tobacco: Never   Tobacco comments:        4 cigs daily--05/12/2022  Vaping Use   Vaping Use: Never used  Substance and Sexual  Activity   Alcohol use: No   Drug use: Yes    Frequency: 21.0 times per week  Types: Marijuana, Cocaine    Comment: last use Cocaine- 03/28/2021. Still using marijuana   Sexual activity: Not on file  Other Topics Concern   Not on file  Social History Narrative   ** Merged History Encounter **       Social Determinants of Health   Financial Resource Strain: Medium Risk (06/19/2021)   Overall Financial Resource Strain (CARDIA)    Difficulty of Paying Living Expenses: Somewhat hard  Food Insecurity: No Food Insecurity (04/20/2022)   Hunger Vital Sign    Worried About Running Out of Food in the Last Year: Never true    Ran Out of Food in the Last Year: Never true  Transportation Needs: No Transportation Needs (04/23/2022)   PRAPARE - Hydrologist (Medical): No    Lack of Transportation (Non-Medical): No  Recent Concern: Transportation Needs - Unmet Transportation Needs (04/20/2022)   PRAPARE - Transportation    Lack of Transportation (Medical): Yes    Lack of Transportation (Non-Medical): Yes  Physical Activity: Insufficiently Active (11/16/2021)   Exercise Vital Sign    Days of Exercise per Week: 5 days    Minutes of Exercise per Session: 20 min  Stress: Not on file  Social Connections: Moderately Isolated (04/28/2021)   Social Connection and Isolation Panel [NHANES]    Frequency of Communication with Friends and Family: More than three times a week    Frequency of Social Gatherings with Friends and Family: Once a week    Attends Religious Services: More than 4 times per year    Active Member of Clubs or Organizations: No    Attends Archivist Meetings: Never    Marital Status: Divorced  Human resources officer Violence: Not At Risk (04/20/2022)   Humiliation, Afraid, Rape, and Kick questionnaire    Fear of Current or Ex-Partner: No    Emotionally Abused: No    Physically Abused: No    Sexually Abused: No     No Known Allergies   CBC     Component Value Date/Time   WBC 12.2 (H) 04/21/2022 0441   RBC 4.82 04/21/2022 0441   HGB 15.8 05/03/2022 1040   HGB 15.4 11/07/2019 0918   HCT 49.3 05/03/2022 1040   HCT 46.2 11/07/2019 0918   PLT 202 04/21/2022 0441   PLT 245 11/07/2019 0918   MCV 90.0 04/21/2022 0441   MCV 90 11/07/2019 0918   MCV 92 10/21/2013 0435   MCH 29.5 04/21/2022 0441   MCHC 32.7 04/21/2022 0441   RDW 14.8 04/21/2022 0441   RDW 14.2 11/07/2019 0918   RDW 15.7 (H) 10/21/2013 0435   LYMPHSABS 1.7 04/19/2022 1712   LYMPHSABS 1.1 10/21/2013 0435   MONOABS 1.0 04/19/2022 1712   MONOABS 0.6 10/21/2013 0435   EOSABS 0.2 04/19/2022 1712   EOSABS 0.0 10/21/2013 0435   BASOSABS 0.1 04/19/2022 1712   BASOSABS 0.0 10/21/2013 0435    Pulmonary Functions Testing Results:     No data to display          Outpatient Medications Prior to Visit  Medication Sig Dispense Refill   Accu-Chek Softclix Lancets lancets Use as instructed 100 each 12   albuterol (PROVENTIL) (2.5 MG/3ML) 0.083% nebulizer solution Take 3 mLs (2.5 mg total) by nebulization every 4 (four) hours as needed for wheezing or shortness of breath. 300 mL 0   albuterol (VENTOLIN HFA) 108 (90 Base) MCG/ACT inhaler Inhale 2 puffs into the lungs every 6 (six) hours as needed for wheezing  or shortness of breath. 6.7 g 0   aspirin EC 81 MG tablet Take 1 tablet (81 mg total) by mouth daily. 30 tablet 0   atorvastatin (LIPITOR) 40 MG tablet Take 1 tablet (40 mg total) by mouth daily. 30 tablet 0   Blood Glucose Monitoring Suppl (ACCU-CHEK GUIDE) w/Device KIT Use as directed 1 kit 0   budesonide-formoterol (SYMBICORT) 160-4.5 MCG/ACT inhaler Inhale 2 puffs into the lungs 2 (two) times daily. 10.2 g 0   carvedilol (COREG) 6.25 MG tablet Take 1 tablet (6.25 mg total) by mouth 2 (two) times daily. 60 tablet 0   chlorpheniramine-HYDROcodone (TUSSIONEX) 10-8 MG/5ML Take 5 mLs by mouth every 12 (twelve) hours as needed for cough. 70 mL 0   dapagliflozin  propanediol (FARXIGA) 10 MG TABS tablet Take 1 tablet (10 mg total) by mouth daily. 30 tablet 0   DULoxetine (CYMBALTA) 20 MG capsule Take 1 capsule (20 mg total) by mouth daily. 30 capsule 3   glucose blood (ACCU-CHEK GUIDE) test strip Use as directed to check blood sugar 1-2 times a day 100 each 12   isosorbide-hydrALAZINE (BIDIL) 20-37.5 MG tablet Take 2 tablets by mouth 3 (three) times daily. 180 tablet 1   montelukast (SINGULAIR) 10 MG tablet TAKE 1 TABLET (10 MG TOTAL) BY MOUTH AT BEDTIME. 30 tablet 0   Tiotropium Bromide Monohydrate (SPIRIVA RESPIMAT) 2.5 MCG/ACT AERS Inhale 2 puffs into the lungs daily. 4 g 0   torsemide (DEMADEX) 20 MG tablet Take 1 tablet (20 mg total) by mouth daily. 30 tablet 0   allopurinol (ZYLOPRIM) 100 MG tablet TAKE 2 TABLETS (200 MG TOTAL) BY MOUTH DAILY. 60 tablet 0   No facility-administered medications prior to visit.

## 2022-05-13 ENCOUNTER — Ambulatory Visit
Admission: RE | Admit: 2022-05-13 | Discharge: 2022-05-13 | Disposition: A | Discharge status: Home / Self Care | Payer: Medicaid Other | Source: Ambulatory Visit | Attending: Urology | Admitting: Urology

## 2022-05-13 ENCOUNTER — Other Ambulatory Visit: Payer: Self-pay

## 2022-05-13 DIAGNOSIS — N2889 Other specified disorders of kidney and ureter: Secondary | ICD-10-CM | POA: Diagnosis not present

## 2022-05-13 HISTORY — PX: IR RADIOLOGIST EVAL & MGMT: IMG5224

## 2022-05-13 NOTE — Consult Note (Signed)
Chief Complaint: Patient was seen in virtual telephone consultation today for right renal mass.  Referring Physician(s): Stoioff,Scott C   History of Present Illness: Chris [name] is a 61 y.o. male with history of recently diagnosed right renal mass concerning for renal cell carcinoma.  He was admitted at [facility] for acute respiratory failure and due to CKD III received a renal ultrasound on 04/22/22 which incidentally noted a 2.9 cm solid mass.  This was further evaluated by MRI on 04/23/22 which demonstrated an enhancing mass concerning for renal cell carcinoma.  His respiratory symptoms have improved significantly since discharge home.  He denies any symptoms related to his mass including pain, discomfort, hematuria, or any history of urinary tract infections.   Past Medical History:  Diagnosis Date   Arrhythmia    atrial flutter   CHF (congestive heart failure) (HCC)    Chronic kidney disease    COPD (chronic obstructive pulmonary disease) ([place])    Coronary artery disease    Depression    Diabetes mellitus without complication ([place])    GERD (gastroesophageal reflux disease)    Gout    Hypertension    Influenza A with respiratory manifestations    Mental disorder     Past Surgical History:  Procedure Laterality Date   ANKLE SURGERY     CARDIAC CATHETERIZATION     CARDIOVERSION N/A 07/08/2020   Procedure: CARDIOVERSION;  Surgeon: Corey Skains, MD;  Location: [facility];  Service: Cardiovascular;  Laterality: N/A;   COLONOSCOPY WITH PROPOFOL N/A 11/19/2021   Procedure: COLONOSCOPY WITH PROPOFOL;  Surgeon: Daryel November, MD;  Location: [facility];  Service: Gastroenterology;  Laterality: N/A;   HERNIA REPAIR     x2   POLYPECTOMY  11/19/2021   Procedure: POLYPECTOMY;  Surgeon: Daryel November, MD;  Location: [facility];  Service: Gastroenterology;;   SHOULDER SURGERY     TEE WITHOUT CARDIOVERSION N/A 07/08/2020   Procedure: TRANSESOPHAGEAL ECHOCARDIOGRAM  (TEE);  Surgeon: Corey Skains, MD;  Location: [facility];  Service: Cardiovascular;  Laterality: N/A;    Allergies: Patient has no known allergies.  Medications: Prior to Admission medications   Medication Sig Start Date End Date Taking? Authorizing Provider  Accu-Chek Softclix Lancets lancets Use as instructed 04/28/22   [name], MD  albuterol (PROVENTIL) (2.5 MG/3ML) 0.083% nebulizer solution Take 3 mLs (2.5 mg total) by nebulization every 4 (four) hours as needed for wheezing or shortness of breath. 04/26/22 05/26/22  [name], MD  albuterol (VENTOLIN HFA) 108 (90 Base) MCG/ACT inhaler Inhale 2 puffs into the lungs every 6 (six) hours as needed for wheezing or shortness of breath. 04/26/22   [name], MD  allopurinol (ZYLOPRIM) 100 MG tablet TAKE 2 TABLETS (200 MG TOTAL) BY MOUTH DAILY. 05/12/22   [name], MD  aspirin EC 81 MG tablet Take 1 tablet (81 mg total) by mouth daily. 04/26/22   [name], MD  atorvastatin (LIPITOR) 40 MG tablet Take 1 tablet (40 mg total) by mouth daily. 04/26/22   [name], MD  Blood Glucose Monitoring Suppl (ACCU-CHEK GUIDE) w/Device KIT Use as directed 04/28/22   [name], MD  budesonide-formoterol [facility]) 160-4.5 MCG/ACT inhaler Inhale 2 puffs into the lungs 2 (two) times daily. 04/26/22   [name], MD  carvedilol (COREG) 6.25 MG tablet Take 1 tablet (6.25 mg total) by mouth 2 (two) times daily. 04/26/22   [name], MD  chlorpheniramine-HYDROcodone (TUSSIONEX) 10-8 MG/5ML Take 5 mLs by mouth  every 12 (twelve) hours as needed for cough. 04/26/22   [name], MD  dapagliflozin propanediol (FARXIGA) 10 MG TABS tablet Take 1 tablet (10 mg total) by mouth daily. 04/26/22   [name], MD  DULoxetine (CYMBALTA) 20 MG capsule Take 1 capsule (20 mg total) by mouth daily. 05/11/22   [name], MD  glucose blood (ACCU-CHEK GUIDE) test strip Use as directed to check  blood sugar 1-2 times a day 04/28/22   [name], MD  isosorbide-hydrALAZINE (BIDIL) 20-37.5 MG tablet Take 2 tablets by mouth 3 (three) times daily. 05/03/22   [name], MD  montelukast (SINGULAIR) 10 MG tablet TAKE 1 TABLET (10 MG TOTAL) BY MOUTH AT BEDTIME. 04/26/22 04/26/23  [name], MD  nicotine polacrilex (NICOTINE MINI) 2 MG lozenge Take 1 lozenge (2 mg total) by mouth every 2 (two) hours as needed for smoking cessation. 05/12/22 08/10/22  Armando Reichert, MD  Tiotropium Bromide Monohydrate (SPIRIVA RESPIMAT) 2.5 MCG/ACT AERS Inhale 2 puffs into the lungs daily. 04/26/22   [name], MD  torsemide (DEMADEX) 20 MG tablet Take 1 tablet (20 mg total) by mouth daily. 04/26/22   [name], MD  metoprolol tartrate (LOPRESSOR) 100 MG tablet Take 1 tablet (100 mg total) by mouth 2 (two) times daily. 07/09/20 07/11/20  [name], MD     Family History  Problem Relation Age of Onset   Heart disease Father    Diabetes Mother    HIV Brother    Healthy Son    Healthy Daughter     Social History   Socioeconomic History   Marital status: Divorced    Spouse name: Not on file   Number of children: 3   Years of education: Not on file   Highest education level: High school graduate  Occupational History   Occupation: disability  Tobacco Use   Smoking status: Every Day    Packs/day: 1.00    Years: 43.00    Total pack years: 43.00    Types: Cigarettes   Smokeless tobacco: Never   Tobacco comments:        4 cigs daily--05/12/2022  Vaping Use   Vaping Use: Never used  Substance and Sexual Activity   Alcohol use: No   Drug use: Yes    Frequency: 21.0 times per week    Types: Marijuana, Cocaine    Comment: last use Cocaine- 03/28/2021. Still using marijuana   Sexual activity: Not on file  Other Topics Concern   Not on file  Social History Narrative   ** Merged History Encounter **       Social Determinants of Health   Financial Resource  Strain: Medium Risk (06/19/2021)   Overall Financial Resource Strain (CARDIA)    Difficulty of Paying Living Expenses: Somewhat hard  Food Insecurity: No Food Insecurity (04/20/2022)   Hunger Vital Sign    Worried About Running Out of Food in the Last Year: Never true    Ran Out of Food in the Last Year: Never true  Transportation Needs: No Transportation Needs (04/23/2022)   PRAPARE - Hydrologist (Medical): No    Lack of Transportation (Non-Medical): No  Recent Concern: Transportation Needs - Unmet Transportation Needs (04/20/2022)   PRAPARE - Transportation    Lack of Transportation (Medical): Yes    Lack of Transportation (Non-Medical): Yes  Physical Activity: Insufficiently Active (11/16/2021)   Exercise Vital Sign    Days of Exercise per Week: 5 days  Minutes of Exercise per Session: 20 min  Stress: Not on file  Social Connections: Moderately Isolated (04/28/2021)   Social Connection and Isolation Panel [NHANES]    Frequency of Communication with Friends and Family: More than three times a week    Frequency of Social Gatherings with Friends and Family: Once a week    Attends Religious Services: More than 4 times per year    Active Member of [employer] or Organizations: No    Attends Archivist Meetings: Never    Marital Status: Divorced    Review of Systems: A 12 point ROS discussed and pertinent positives are indicated in the HPI above.  All other systems are negative.   Vital Signs: There were no vitals taken for this visit.  No physical examination was performed in lieu of virtual telephone clinic visit.   Imaging: MRI Abdomen 04/23/22   Right upper pole enhancing mass 2.8 x 2.5 x 2.3 cm  Labs:  CBC: Recent Labs    01/04/22 0845 04/19/22 1712 04/20/22 0607 04/21/22 0441 05/03/22 1040  WBC 10.4 9.5 8.3 12.2*  --   HGB 14.6 14.8 15.0 14.2 15.8  HCT 45.0 45.7 45.4 43.4 49.3  PLT 211 196 211 202  --     COAGS: No results  for input(s): "INR", "APTT" in the last 8760 hours.  BMP: Recent Labs    04/20/22 0607 04/21/22 0441 04/22/22 0533 04/24/22 0759 04/26/22 0633  NA 138 138 137 138  --   K 4.8 4.6 4.9 4.9  --   CL 107 106 103 107  --   CO2 _0 --   GLUCOSE 137* 105* 128* 126*  --   BUN 36* 50* 57* 51*  --   CALCIUM 9.6 8.8* 9.0 8.9  --   CREATININE 1.93* 2.20* 1.92* 1.93* 2.08*  GFRNONAA 39* 34* 40* 39* 36*    LIVER FUNCTION TESTS: Recent Labs    04/19/22 1605  BILITOT 0.7  AST 29  ALT 21  ALKPHOS 78  PROT 8.1  ALBUMIN 4.0    TUMOR MARKERS: No results for input(s): "AFPTM", "CEA", "CA199", "CHROMGRNA" in the last 8760 hours.  Assessment and Plan: 61 year old male with enhancing right upper pole renal mass measuring up to 2.8 cm concerning for renal cell carcinoma.  He is interested in pursuing percutaneous ablation.  We discussed percutaneous biopsy at the time of ablation to establish tissue diagnosis.  Risks and benefits of image guided renal microwave ablation was discussed with the patient including, but not limited to, failure to treat entire lesion, bleeding, infection, damage to adjacent structures, hematuria, urine leak, decrease in renal function or post procedural neuropathy.  All of the patient's questions were answered and the patient is agreeable to proceed.   -Plan for CT and ultrasound guided right renal mass biopsy and microwave ablation at [facility] with general anesthesia. -hold aspirin for 5 days prior to procedure -plan for same day discharge home     Thank you for this interesting consult.  I greatly enjoyed meeting Chris [name] and look forward to participating in their care.  A copy of this report was sent to the requesting provider on this date.  Electronically Signed: Suzette Battiest, MD 05/13/2022, 8:09 AM   I spent a total of  30 Minutes in virtual telephone clinical consultation, greater than 50% of which was  counseling/coordinating care for right renal mass

## 2022-05-17 ENCOUNTER — Ambulatory Visit: Payer: Medicaid Other | Admitting: Student in an Organized Health Care Education/Training Program

## 2022-05-19 ENCOUNTER — Ambulatory Visit: Payer: Medicaid Other | Admitting: Family

## 2022-05-19 ENCOUNTER — Other Ambulatory Visit: Payer: Self-pay

## 2022-05-19 ENCOUNTER — Telehealth: Payer: Self-pay | Admitting: Internal Medicine

## 2022-05-19 DIAGNOSIS — I10 Essential (primary) hypertension: Secondary | ICD-10-CM | POA: Diagnosis not present

## 2022-05-19 DIAGNOSIS — N1832 Chronic kidney disease, stage 3b: Secondary | ICD-10-CM | POA: Diagnosis not present

## 2022-05-19 DIAGNOSIS — E1122 Type 2 diabetes mellitus with diabetic chronic kidney disease: Secondary | ICD-10-CM | POA: Diagnosis not present

## 2022-05-19 DIAGNOSIS — R809 Proteinuria, unspecified: Secondary | ICD-10-CM | POA: Diagnosis not present

## 2022-05-19 NOTE — Telephone Encounter (Signed)
-----   Message from Ena Dawley sent at 05/18/2022 11:36 AM EST ----- Regarding: RE: [facility] Referral Referral never came to me it went straight  to  [place] and I just fixed  and sent to  [employer] [facility]  [place]   ----- Message ----- From: [name], MD Sent: 05/12/2022   1:03 PM EST To: Ena Dawley Subject: [place] Referral                            Please try to get him in with behavioral health in [facility] where he lives.  If you cannot get him in with someone there, then with someone in [place].

## 2022-05-25 ENCOUNTER — Telehealth (HOSPITAL_COMMUNITY): Payer: Self-pay | Admitting: *Deleted

## 2022-05-25 NOTE — Telephone Encounter (Signed)
Reaching out to patient to offer assistance regarding upcoming cardiac imaging study; pt verbalizes understanding of appt date/time, parking situation and where to check in,  and verified current allergies; name and call back number provided for further questions should they arise  Gordy Clement RN [facility] [phone] office [phone] cell   Patient is unsure if he is claustrophobic but does deny metal.

## 2022-05-26 ENCOUNTER — Other Ambulatory Visit: Payer: Self-pay | Admitting: Cardiology

## 2022-05-26 ENCOUNTER — Encounter: Payer: Self-pay | Admitting: Internal Medicine

## 2022-05-26 ENCOUNTER — Ambulatory Visit
Admission: RE | Admit: 2022-05-26 | Discharge: 2022-05-26 | Disposition: A | Discharge status: Home / Self Care | Payer: Medicaid Other | Source: Ambulatory Visit | Attending: Cardiology | Admitting: Cardiology

## 2022-05-26 DIAGNOSIS — I5020 Unspecified systolic (congestive) heart failure: Secondary | ICD-10-CM

## 2022-05-26 DIAGNOSIS — I5042 Chronic combined systolic (congestive) and diastolic (congestive) heart failure: Secondary | ICD-10-CM

## 2022-05-26 DIAGNOSIS — I517 Cardiomegaly: Secondary | ICD-10-CM | POA: Diagnosis not present

## 2022-05-26 MED ORDER — GADOBUTROL 1 MMOL/ML IV SOLN
14.0000 mL | Freq: Once | INTRAVENOUS | Status: AC | PRN
  Administered 2022-05-26: 14 mL via INTRAVENOUS

## 2022-05-26 NOTE — Progress Notes (Signed)
Note received from nephrologist Dr. Murlean Iba.  Pt seen 05/19/2022.

## 2022-05-28 ENCOUNTER — Telehealth: Payer: Self-pay

## 2022-05-28 NOTE — Telephone Encounter (Signed)
Janan Ridge, [place] 05/28/2022  3:24 PM EST Back to Top    Called patient. Patient was not Available to talk Left message with person that picked up the phone to have patient return call.   [name], MD 05/28/2022  1:18 PM EST     MRI shows severe thickness of one of the LV walls.  Continue medications as prescribed.

## 2022-06-02 ENCOUNTER — Ambulatory Visit: Payer: Medicaid Other

## 2022-06-06 NOTE — Progress Notes (Deleted)
Patient ID: Chris [name], male    DOB: Oct 07, 1960, 61 y.o.   MRN: [id number]  HPI  Chris [name] is a 61 y/o male with a history of CAD, DM, HTN, CKD, gout, depression, atrial flutter, COPD, GERD, current tobacco use and chronic heart failure.   Echo report from 8/15//23 showed an EF of 50% along with moderate LVH and mild/moderate LAE. EF much improved from previous 30-35%  Cardiac MRI done 05/26/22 showed: 1.  Normal LV size, severe asymmetric septal hypertrophy.  2.  Basal septal wall measuring upto 1.8 cm in thickness.  3. Mid wall LGE/scar in the basal septum (area of maximal thickness).  4.  No LVOT obstruction noted.  5. Study quality was poor with gating and motion artifact. EF could not be calculated. Appears mildly reduced visually.  6.  Findings consistent with HCM, asymmetric septal variant.  Admitted 04/19/22 due to acute onset of worsening shortness of breath over the last 4 days with associated cough as well as nausea and vomiting. Initially given IV lasix with transition to oral medications. Pulmonology consult obtained. Given IV solu-medrol. RSV +. Weaned off oxygen to room air. Discharged after 7 days. Was in the ED 03/22/22 but LWBS.   He presents today for a follow-up visit with a chief complaint of   Past Medical History:  Diagnosis Date   Arrhythmia    atrial flutter   CHF (congestive heart failure) ([place])    Chronic kidney disease    COPD (chronic obstructive pulmonary disease) ([place])    Coronary artery disease    Depression    Diabetes mellitus without complication ([place])    GERD (gastroesophageal reflux disease)    Gout    Hypertension    Influenza A with respiratory manifestations    Mental disorder    Past Surgical History:  Procedure Laterality Date   ANKLE SURGERY     CARDIAC CATHETERIZATION     CARDIOVERSION N/A 07/08/2020   Procedure: CARDIOVERSION;  Surgeon: Corey Skains, MD;  Location: [facility];  Service: Cardiovascular;  Laterality: N/A;    COLONOSCOPY WITH PROPOFOL N/A 11/19/2021   Procedure: COLONOSCOPY WITH PROPOFOL;  Surgeon: Daryel November, MD;  Location: [facility];  Service: Gastroenterology;  Laterality: N/A;   HERNIA REPAIR     x2   IR RADIOLOGIST EVAL & MGMT  05/13/2022   POLYPECTOMY  11/19/2021   Procedure: POLYPECTOMY;  Surgeon: Daryel November, MD;  Location: [facility];  Service: Gastroenterology;;   SHOULDER SURGERY     TEE WITHOUT CARDIOVERSION N/A 07/08/2020   Procedure: TRANSESOPHAGEAL ECHOCARDIOGRAM (TEE);  Surgeon: Corey Skains, MD;  Location: [facility] OR[facility];  Service: Cardiovascular;  Laterality: N/A;   Family History  Problem Relation Age of Onset   Heart disease Father    Diabetes Mother    HIV Brother    Healthy Son    Healthy Daughter    Social History   Tobacco Use   Smoking status: Every Day    Packs/day: 1.00    Years: 43.00    Total pack years: 43.00    Types: Cigarettes   Smokeless tobacco: Never   Tobacco comments:        4 cigs daily--05/12/2022  Substance Use Topics   Alcohol use: No   No Known Allergies   Review of Systems  Constitutional:  Positive for fatigue (at times). Negative for appetite change.  HENT:  Negative for congestion, postnasal drip and sore throat.   Eyes: Negative.   Respiratory:  Positive for cough (dry) and shortness of breath (improving). Negative for chest tightness.   Cardiovascular:  Negative for chest pain, palpitations and leg swelling.  Gastrointestinal:  Negative for abdominal distention and abdominal pain.  Endocrine: Negative.   Genitourinary: Negative.   Musculoskeletal:  Negative for back pain and neck pain.  Skin: Negative.   Allergic/Immunologic: Negative.   Neurological:  Negative for dizziness and light-headedness.  Hematological:  Negative for adenopathy. Does not bruise/bleed easily.  Psychiatric/Behavioral:  Negative for dysphoric mood and sleep disturbance (no cpap). The patient is not nervous/anxious.      Physical  Exam Vitals and nursing note reviewed.  Constitutional:      Appearance: Normal appearance.  HENT:     Head: Normocephalic and atraumatic.  Cardiovascular:     Rate and Rhythm: Normal rate and regular rhythm.  Pulmonary:     Effort: Pulmonary effort is normal. No respiratory distress.     Breath sounds: No wheezing or rales.  Abdominal:     General: There is no distension.     Palpations: Abdomen is soft.  Musculoskeletal:        General: No tenderness.     Cervical back: Normal range of motion and neck supple.     Right lower leg: No edema.     Left lower leg: No edema.  Skin:    General: Skin is warm and dry.  Neurological:     General: No focal deficit present.     Mental Status: He is alert and oriented to person, place, and time.  Psychiatric:        Mood and Affect: Mood is anxious.        Behavior: Behavior normal.        Thought Content: Thought content normal.    Assessment & Plan:  1: Chronic heart failure with preserved ejection fraction with structural changes (LVH)- - NYHA class II - euvolemic today - not weighing daily but does have scales; emphasized weighing daily so that he can call for an overnight weight gain of > 2 pounds or a weekly weight gain of > 5 pounds - weight 256.8 pounds from last visit here 1 month ago - saw cardiology ([place]) 05/03/22 - on GDMT of carvedilol and farxiga - consider entresto - BNP 04/21/22 was 175.1  2: HTN- - BP - saw PCP Wynetta Emery) 05/11/22 - BMP 04/24/22 reviewed and showed sodium 138, potassium 4.9, creatinine 1.93 and GFR 39 - saw nephrology Holley Raring) 05/19/22  3: COPD- - saw pulmonology ([place]) 05/12/22  4: Tobacco use- - smoking 1 pack of cigarettes/ week - smokes marijuana - denies using alcohol - + for cocaine in urine on 04/20/22 - denies any cocaine use but does admit to selling it and feels like the cocaine gets absorbed into 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

## 2022-06-09 ENCOUNTER — Ambulatory Visit: Payer: Medicaid Other | Admitting: Family

## 2022-06-10 ENCOUNTER — Other Ambulatory Visit: Payer: Self-pay | Admitting: Internal Medicine

## 2022-06-10 ENCOUNTER — Encounter: Payer: Self-pay | Admitting: Family

## 2022-06-10 ENCOUNTER — Other Ambulatory Visit: Payer: Self-pay

## 2022-06-10 ENCOUNTER — Ambulatory Visit: Payer: Medicaid Other | Attending: Family | Admitting: Family

## 2022-06-10 VITALS — BP 138/88 | HR 81 | Resp 20 | Wt 262.0 lb

## 2022-06-10 DIAGNOSIS — Z7951 Long term (current) use of inhaled steroids: Secondary | ICD-10-CM | POA: Insufficient documentation

## 2022-06-10 DIAGNOSIS — F17200 Nicotine dependence, unspecified, uncomplicated: Secondary | ICD-10-CM | POA: Diagnosis not present

## 2022-06-10 DIAGNOSIS — Z8249 Family history of ischemic heart disease and other diseases of the circulatory system: Secondary | ICD-10-CM | POA: Insufficient documentation

## 2022-06-10 DIAGNOSIS — N189 Chronic kidney disease, unspecified: Secondary | ICD-10-CM | POA: Insufficient documentation

## 2022-06-10 DIAGNOSIS — M109 Gout, unspecified: Secondary | ICD-10-CM | POA: Diagnosis not present

## 2022-06-10 DIAGNOSIS — K219 Gastro-esophageal reflux disease without esophagitis: Secondary | ICD-10-CM | POA: Diagnosis not present

## 2022-06-10 DIAGNOSIS — J449 Chronic obstructive pulmonary disease, unspecified: Secondary | ICD-10-CM | POA: Diagnosis not present

## 2022-06-10 DIAGNOSIS — I10 Essential (primary) hypertension: Secondary | ICD-10-CM

## 2022-06-10 DIAGNOSIS — Z7984 Long term (current) use of oral hypoglycemic drugs: Secondary | ICD-10-CM | POA: Insufficient documentation

## 2022-06-10 DIAGNOSIS — I5032 Chronic diastolic (congestive) heart failure: Secondary | ICD-10-CM | POA: Diagnosis not present

## 2022-06-10 DIAGNOSIS — I2510 Atherosclerotic heart disease of native coronary artery without angina pectoris: Secondary | ICD-10-CM | POA: Diagnosis not present

## 2022-06-10 DIAGNOSIS — E1122 Type 2 diabetes mellitus with diabetic chronic kidney disease: Secondary | ICD-10-CM | POA: Diagnosis not present

## 2022-06-10 DIAGNOSIS — I509 Heart failure, unspecified: Secondary | ICD-10-CM | POA: Insufficient documentation

## 2022-06-10 DIAGNOSIS — F17210 Nicotine dependence, cigarettes, uncomplicated: Secondary | ICD-10-CM | POA: Diagnosis not present

## 2022-06-10 DIAGNOSIS — I130 Hypertensive heart and chronic kidney disease with heart failure and stage 1 through stage 4 chronic kidney disease, or unspecified chronic kidney disease: Secondary | ICD-10-CM | POA: Insufficient documentation

## 2022-06-10 DIAGNOSIS — F32A Depression, unspecified: Secondary | ICD-10-CM | POA: Diagnosis not present

## 2022-06-10 DIAGNOSIS — Z833 Family history of diabetes mellitus: Secondary | ICD-10-CM | POA: Insufficient documentation

## 2022-06-10 MED ORDER — ATORVASTATIN CALCIUM 40 MG PO TABS
40.0000 mg | ORAL_TABLET | Freq: Every day | ORAL | 3 refills | Status: DC
  Filled 2022-06-10 – 2022-07-09 (×2): qty 90, 90d supply, fill #0

## 2022-06-10 MED ORDER — DAPAGLIFLOZIN PROPANEDIOL 10 MG PO TABS
10.0000 mg | ORAL_TABLET | Freq: Every day | ORAL | 3 refills | Status: DC
  Filled 2022-06-10 – 2022-07-09 (×2): qty 90, 90d supply, fill #0

## 2022-06-10 MED ORDER — CARVEDILOL 6.25 MG PO TABS
6.2500 mg | ORAL_TABLET | Freq: Two times a day (BID) | ORAL | 3 refills | Status: DC
  Filled 2022-06-10 – 2022-07-09 (×2): qty 180, 90d supply, fill #0

## 2022-06-10 MED ORDER — ISOSORB DINITRATE-HYDRALAZINE 20-37.5 MG PO TABS
2.0000 | ORAL_TABLET | Freq: Three times a day (TID) | ORAL | 3 refills | Status: DC
  Filled 2022-06-10: qty 540, 90d supply, fill #0

## 2022-06-10 MED ORDER — TORSEMIDE 20 MG PO TABS
20.0000 mg | ORAL_TABLET | Freq: Every day | ORAL | 3 refills | Status: DC
  Filled 2022-06-10: qty 90, 90d supply, fill #0

## 2022-06-10 NOTE — Patient Instructions (Addendum)
Continue weighing daily and call for an overnight weight gain of 3 pounds or more or a weekly weight gain of more than 5 pounds.   If you have voicemail, please make sure your mailbox is cleaned out so that we may leave a message and please make sure to listen to any voicemails.    Call pulmonology at [phone] to get pulmonary function tests rescheduled.    Go to [facility] pharmacy to pick up your medication. Once you go out the driveway, turn right and it will be the 2nd building on your left. I refilled your torsemide, bidil, farxiga, carvedilol and lipitor. Your other medications will need to come from your primary care provider.

## 2022-06-10 NOTE — Progress Notes (Signed)
Patient ID: Chris [name], male    DOB: November 25, 1960, 61 y.o.   MRN: [id number]  HPI  Chris [name] is a 61 y/o male with a history of CAD, DM, HTN, CKD, gout, depression, atrial flutter, COPD, GERD, current tobacco use and chronic heart failure.   Echo report from 8/15//23 showed an EF of 50% along with moderate LVH and mild/moderate LAE. EF much improved from previous 30-35%  Cardiac MRI done 05/26/22 showed: 1.  Normal LV size, severe asymmetric septal hypertrophy.  2.  Basal septal wall measuring upto 1.8 cm in thickness.  3.  Mid wall LGE/scar in the basal septum (area of maximal thickness).  4.  No LVOT obstruction noted.  5.  Study quality was poor with gating and motion artifact. EF could not be calculated. Appears mildly reduced visually.  6.  Findings consistent with HCM, asymmetric septal variant.  Admitted 04/19/22 due to acute onset of worsening shortness of breath over the last 4 days with associated cough as well as nausea and vomiting. Initially given IV lasix with transition to oral medications. Pulmonology consult obtained. Given IV solu-medrol. RSV +. Weaned off oxygen to room air. Discharged after 7 days. Was in the ED 03/22/22 but LWBS.   He presents today for a follow-up visit with a chief complaint of minimal fatigue with moderate exertion. Describes this as chronic in nature. Has associated cough, shortness of breath, palpitations & gradual weight gain along with this. He denies any difficulty sleeping, dizziness, abdominal distention, pedal edema or chest pain.   Missed getting his PFT's done due to transportation issues.   Past Medical History:  Diagnosis Date   Arrhythmia    atrial flutter   CHF (congestive heart failure) ([facility])    Chronic kidney disease    COPD (chronic obstructive pulmonary disease) ([place])    Coronary artery disease    Depression    Diabetes mellitus without complication ([place])    GERD (gastroesophageal reflux disease)    Gout    Hypertension     Influenza A with respiratory manifestations    Mental disorder    Past Surgical History:  Procedure Laterality Date   ANKLE SURGERY     CARDIAC CATHETERIZATION     CARDIOVERSION N/A 07/08/2020   Procedure: CARDIOVERSION;  Surgeon: Corey Skains, MD;  Location: [facility];  Service: Cardiovascular;  Laterality: N/A;   COLONOSCOPY WITH PROPOFOL N/A 11/19/2021   Procedure: COLONOSCOPY WITH PROPOFOL;  Surgeon: Daryel November, MD;  Location: [facility];  Service: Gastroenterology;  Laterality: N/A;   HERNIA REPAIR     x2   IR RADIOLOGIST EVAL & MGMT  05/13/2022   POLYPECTOMY  11/19/2021   Procedure: POLYPECTOMY;  Surgeon: Daryel November, MD;  Location: [facility];  Service: Gastroenterology;;   SHOULDER SURGERY     TEE WITHOUT CARDIOVERSION N/A 07/08/2020   Procedure: TRANSESOPHAGEAL ECHOCARDIOGRAM (TEE);  Surgeon: Corey Skains, MD;  Location: [facility];  Service: Cardiovascular;  Laterality: N/A;   Family History  Problem Relation Age of Onset   Heart disease Father    Diabetes Mother    HIV Brother    Healthy Son    Healthy Daughter    Social History   Tobacco Use   Smoking status: Every Day    Packs/day: 1.00    Years: 43.00    Total pack years: 43.00    Types: Cigarettes   Smokeless tobacco: Never   Tobacco comments:        4  cigs daily--05/12/2022  Substance Use Topics   Alcohol use: No   No Known Allergies  Prior to Admission medications   Medication Sig Start Date End Date Taking? Authorizing Provider  Accu-Chek Softclix Lancets lancets Use as instructed 04/28/22  Yes [name], MD  albuterol (PROVENTIL) (2.5 MG/3ML) 0.083% nebulizer solution Take 3 mLs (2.5 mg total) by nebulization every 4 (four) hours as needed for wheezing or shortness of breath. 04/26/22  Yes Wieting, Richard, MD  albuterol (VENTOLIN HFA) 108 (90 Base) MCG/ACT inhaler Inhale 2 puffs into the lungs every 6 (six) hours as needed for wheezing or shortness of breath.  04/26/22  Yes Wieting, Richard, MD  allopurinol (ZYLOPRIM) 100 MG tablet TAKE 2 TABLETS (200 MG TOTAL) BY MOUTH DAILY. 05/12/22  Yes [name], MD  aspirin EC 81 MG tablet Take 1 tablet (81 mg total) by mouth daily. 04/26/22  Yes [name], MD  Blood Glucose Monitoring Suppl (ACCU-CHEK GUIDE) w/Device KIT Use as directed 04/28/22  Yes [name], MD  budesonide-formoterol [facility]) 160-4.5 MCG/ACT inhaler Inhale 2 puffs into the lungs 2 (two) times daily. 04/26/22  Yes Wieting, Richard, MD  glucose blood (ACCU-CHEK GUIDE) test strip Use as directed to check blood sugar 1-2 times a day 04/28/22  Yes [name], MD  montelukast (SINGULAIR) 10 MG tablet TAKE 1 TABLET (10 MG TOTAL) BY MOUTH AT BEDTIME. 04/26/22 04/26/23 Yes Wieting, Richard, MD  Tiotropium Bromide Monohydrate (SPIRIVA RESPIMAT) 2.5 MCG/ACT AERS Inhale 2 puffs into the lungs daily. 04/26/22  Yes Wieting, Richard, MD  atorvastatin (LIPITOR) 40 MG tablet Take 1 tablet (40 mg total) by mouth daily. 06/10/22   Alisa Graff, FNP  carvedilol (COREG) 6.25 MG tablet Take 1 tablet (6.25 mg total) by mouth 2 (two) times daily. 06/10/22   Alisa Graff, FNP  chlorpheniramine-HYDROcodone (TUSSIONEX) 10-8 MG/5ML Take 5 mLs by mouth every 12 (twelve) hours as needed for cough. 04/26/22   [name], MD  dapagliflozin propanediol (FARXIGA) 10 MG TABS tablet Take 1 tablet (10 mg total) by mouth daily. 06/10/22   Alisa Graff, FNP  DULoxetine (CYMBALTA) 20 MG capsule Take 1 capsule (20 mg total) by mouth daily. Patient not taking: Reported on 06/10/2022 05/11/22   [name], MD  isosorbide-hydrALAZINE (BIDIL) 20-37.5 MG tablet Take 2 tablets by mouth 3 (three) times daily. 06/10/22   Alisa Graff, FNP  torsemide (DEMADEX) 20 MG tablet Take 1 tablet (20 mg total) by mouth daily. 06/10/22   Alisa Graff, FNP  metoprolol tartrate (LOPRESSOR) 100 MG tablet Take 1 tablet (100 mg total) by mouth 2  (two) times daily. 07/09/20 07/11/20  [name], MD    Review of Systems  Constitutional:  Positive for fatigue (at times). Negative for appetite change.  HENT:  Negative for congestion, postnasal drip and sore throat.   Eyes: Negative.   Respiratory:  Positive for cough (dry) and shortness of breath (improving). Negative for chest tightness.   Cardiovascular:  Positive for palpitations (at times). Negative for chest pain and leg swelling.  Gastrointestinal:  Negative for abdominal distention and abdominal pain.  Endocrine: Negative.   Genitourinary: Negative.   Musculoskeletal:  Negative for back pain and neck pain.  Skin: Negative.   Allergic/Immunologic: Negative.   Neurological:  Negative for dizziness and light-headedness.  Hematological:  Negative for adenopathy. Does not bruise/bleed easily.  Psychiatric/Behavioral:  Negative for dysphoric mood and sleep disturbance (no cpap). The patient is not nervous/anxious.  Vitals:   06/10/22 0858  BP: 138/88  Pulse: 81  Resp: 20  SpO2: 97%  Weight: 262 lb (118.8 kg)   Wt Readings from Last 3 Encounters:  06/10/22 262 lb (118.8 kg)  05/12/22 253 lb (114.8 kg)  05/11/22 254 lb (115.2 kg)   Lab Results  Component Value Date   CREATININE 2.08 (H) 04/26/2022   CREATININE 1.93 (H) 04/24/2022   CREATININE 1.92 (H) 04/22/2022    Physical Exam Vitals and nursing note reviewed.  Constitutional:      Appearance: Normal appearance.  HENT:     Head: Normocephalic and atraumatic.  Cardiovascular:     Rate and Rhythm: Normal rate and regular rhythm.  Pulmonary:     Effort: Pulmonary effort is normal. No respiratory distress.     Breath sounds: No wheezing or rales.  Abdominal:     General: There is no distension.     Palpations: Abdomen is soft.  Musculoskeletal:        General: No tenderness.     Cervical back: Normal range of motion and neck supple.     Right lower leg: No edema.     Left lower leg: No edema.  Skin:     General: Skin is warm and dry.  Neurological:     General: No focal deficit present.     Mental Status: He is alert and oriented to person, place, and time.  Psychiatric:        Mood and Affect: Mood is anxious.        Behavior: Behavior normal.        Thought Content: Thought content normal.    Assessment & Plan:  1: Chronic heart failure with preserved ejection fraction with structural changes (LVH)- - NYHA class II - euvolemic today - not weighing daily but does have scales; emphasized weighing daily so that he can call for an overnight weight gain of > 2 pounds or a weekly weight gain of > 5 pounds - weight up 6 pounds from last visit here 1 month ago - saw cardiology ([place]) 05/03/22 - on GDMT of carvedilol and farxiga - consider entresto but will refer to [facility] first to evaluate for HCM, per cardiac MRI results - BNP 04/21/22 was 175.1  2: HTN- - BP mildly elevated (138/88) - saw PCP Wynetta Emery) 05/11/22 - BMP 04/24/22 reviewed and showed sodium 138, potassium 4.9, creatinine 1.93 and GFR 39 - saw nephrology Holley Raring) 05/19/22  3: COPD- - saw pulmonology ([place]) 05/12/22 - pulmonology # placed on AVS so that he can call to r/s PFT's  4: Tobacco use- - smoking 1 pack of cigarettes/ week - smokes marijuana - denies using alcohol - + for cocaine in urine on 04/20/22 - denies any cocaine use but does admit to selling it and feels like the cocaine gets absorbed into 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  Return in 3-4 weeks, sooner if needed.

## 2022-06-11 ENCOUNTER — Other Ambulatory Visit: Payer: Self-pay

## 2022-06-15 ENCOUNTER — Encounter (HOSPITAL_COMMUNITY): Payer: Self-pay

## 2022-06-15 ENCOUNTER — Other Ambulatory Visit (HOSPITAL_COMMUNITY): Payer: Self-pay | Admitting: Interventional Radiology

## 2022-06-15 ENCOUNTER — Other Ambulatory Visit: Payer: Self-pay

## 2022-06-15 DIAGNOSIS — C641 Malignant neoplasm of right kidney, except renal pelvis: Secondary | ICD-10-CM

## 2022-06-22 ENCOUNTER — Other Ambulatory Visit: Payer: Self-pay

## 2022-06-22 ENCOUNTER — Encounter (HOSPITAL_COMMUNITY): Payer: Self-pay | Admitting: Interventional Radiology

## 2022-06-22 ENCOUNTER — Other Ambulatory Visit: Payer: Self-pay | Admitting: Radiology

## 2022-06-22 DIAGNOSIS — N2889 Other specified disorders of kidney and ureter: Secondary | ICD-10-CM

## 2022-06-22 NOTE — Progress Notes (Signed)
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 Last seen by Dr. Garen Lah 05/03/22, described NYHA II-III symptoms at that time. BiDil was increased to 2 tabs three times daily. He was continued on Coreg, torsemide, and [place]. cMRI was ordered for evaluation of severe asymmetric septal hypertrophy. cMRI 05/26/22 showed findings consistent with HCM, asymmetric septal variant.   Recent admission at [facility] November 2023 for acute respiratory failure secondary to COPD exacerbation and RSV infection. He initially required BiPAP and supplemental O2. Required high doses of solu medrol due to persistent bronchospasm. Also received lasix due to fluid overload. He was able to wean off supplemental O2 prior to discharge. Urine tox noted to be positive for cocaine (pt has long hx of cocaine use). He was also incidentally found to have right renal mass. This was further evaluated by MRI on 04/23/22 which demonstrated an enhancing mass concerning for renal cell carcinoma.   Follows with nephrology for hx of CKD 3.  Current smoker with associated COPD. Maintained on Spiriva and Symbicort.  NIDDM2, A1c 6.2 on 04/20/22.  Pt will need DOS labs and eval.  EKG 05/03/22: Sinus rhythm with occasional PVCs. Rate 92. LAD. Moderate voltage criteria for LVH. Nonspecific T wave abnormality. Prolonged QT (Qtc-Baz 484)/  Cardiac MRI 05/26/22: IMPRESSION: 1.  Normal LV size, severe asymmetric septal hypertrophy. 2.  Basal septal wall measuring upto 1.8 cm in thickness. 3. Mid wall LGE/scar in the basal septum (area of maximal thickness). 4.  No LVOT obstruction noted. 5. Study quality was poor with gating and motion artifact. EF could not be calculated. Appears mildly reduced visually. 6.  Findings consistent  with HCM, asymmetric septal variant.  TTE 01/26/22:  1. Left ventricular ejection fraction, by estimation, is 50 %. The left  ventricle has low normal function. The left ventricle has no regional wall  motion abnormalities. The left ventricular internal cavity size was  moderately dilated. There is moderate  left ventricular hypertrophy with severe septal hypertrophy. Left  ventricular diastolic parameters are consistent with Grade I diastolic  dysfunction (impaired relaxation). The average left ventricular global  longitudinal strain is -10.4 %.   2. Right ventricular systolic function is normal. The right ventricular  size is normal.   3. Left atrial size was mild to moderately dilated.   4. The mitral valve is normal in structure. No evidence of mitral valve  regurgitation. No evidence of mitral stenosis.   5. The aortic valve was not well visualized. Aortic valve regurgitation  is not visualized. No aortic stenosis is present.   6. There is mild dilatation of the aortic root, measuring 40 mm. There is  borderline dilatation of the ascending aorta, measuring 37 mm.   7. The inferior vena cava is normal in size with greater than 50%  respiratory variability, suggesting right atrial pressure of 3 mmHg.   Nuclear stress 07/28/21:   Abnormal pharmacologic myocardial perfusion stress test.   There is a moderate in size, mild in severity, partially reversible mid inferior, mid inferolateral, and apical inferior defect that may represent scar with periinfarct ischemia but cannot rule out an element of artifact.   Left ventricular systolic function is severely reduced (LVEF 25-35%) with dilation of the left ventricle.   There is no significant coronary artery calcification.  Minimal aortic atherosclarosis  is noted.   This is a high-risk study due to severely reduced LVEF.   Wynonia Musty [facility]/Anesthesiology Phone [phone] 06/22/2022 10:45 AM

## 2022-06-22 NOTE — Anesthesia Preprocedure Evaluation (Signed)
Anesthesia Evaluation  Patient identified by MRN, date of birth, ID band Patient awake    Reviewed: Allergy & Precautions, NPO status , Patient's Chart, lab work & pertinent test results  History of Anesthesia Complications Negative for: history of anesthetic complications  Airway Mallampati: III  TM Distance: >3 FB Neck ROM: Full    Dental  (+) Partial Upper, Dental Advisory Given   Pulmonary COPD, Current Smoker and Patient abstained from smoking.   breath sounds clear to auscultation       Cardiovascular hypertension, Pt. on medications and Pt. on home beta blockers (-) angina + CAD and +CHF   Rhythm:Regular  1. Left ventricular ejection fraction, by estimation, is 50 %. The left  ventricle has low normal function. The left ventricle has no regional wall  motion abnormalities. The left ventricular internal cavity size was  moderately dilated. There is moderate  left ventricular hypertrophy with severe septal hypertrophy. Left  ventricular diastolic parameters are consistent with Grade I diastolic  dysfunction (impaired relaxation). The average left ventricular global  longitudinal strain is -10.4 %.   2. Right ventricular systolic function is normal. The right ventricular  size is normal.   3. Left atrial size was mild to moderately dilated.   4. The mitral valve is normal in structure. No evidence of mitral valve  regurgitation. No evidence of mitral stenosis.   5. The aortic valve was not well visualized. Aortic valve regurgitation  is not visualized. No aortic stenosis is present.   6. There is mild dilatation of the aortic root, measuring 40 mm. There is  borderline dilatation of the ascending aorta, measuring 37 mm.   7. The inferior vena cava is normal in size with greater than 50%  respiratory variability, suggesting right atrial pressure of 3 mmHg.     Neuro/Psych neg Seizures PSYCHIATRIC DISORDERS  Depression        GI/Hepatic ,GERD  ,,(+)     substance abuse  cocaine useLast use two days ago   Endo/Other  diabetes  Lab Results      Component                Value               Date                      HGBA1C                   6.2 (H)             04/20/2022             Renal/GU CRFRenal diseaseLab Results      Component                Value               Date                      CREATININE               1.75 (H)            06/23/2022                Musculoskeletal negative musculoskeletal ROS (+)    Abdominal   Peds  Hematology Lab Results      Component                Value  Date                      WBC                      10.0                06/23/2022                HGB                      13.5                06/23/2022                HCT                      40.7                06/23/2022                MCV                      92.3                06/23/2022                PLT                      187                 06/23/2022              Anesthesia Other Findings   Reproductive/Obstetrics                             Anesthesia Physical Anesthesia Plan  ASA: 3  Anesthesia Plan: General   Post-op Pain Management: Toradol IV (intra-op)*   Induction: Intravenous  PONV Risk Score and Plan: 1 and Ondansetron and Dexamethasone  Airway Management Planned: Oral ETT  Additional Equipment:   Intra-op Plan:   Post-operative Plan: Extubation in OR  Informed Consent: I have reviewed the patients History and Physical, chart, labs and discussed the procedure including the risks, benefits and alternatives for the proposed anesthesia with the patient or authorized representative who has indicated his/her understanding and acceptance.     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 Last seen by Dr. Garen Lah 05/03/22, described NYHA II-III symptoms at that time. BiDil was increased to 2 tabs three times daily. He was continued on Coreg, torsemide, and [place]. cMRI was ordered for evaluation of severe asymmetric septal hypertrophy. cMRI 05/26/22 showed findings consistent with HCM, asymmetric septal variant.   Recent admission at [facility] November 2023 for acute respiratory failure secondary to COPD exacerbation and RSV infection. He initially required BiPAP and supplemental O2. Required high doses of solu medrol due to persistent bronchospasm. Also received lasix due to fluid overload. He was able to wean off supplemental O2 prior to discharge. Urine tox noted to be positive for cocaine (pt has long hx of cocaine use). He was also incidentally  found to have right renal mass. This was further evaluated by MRI on 04/23/22 which demonstrated an enhancing mass concerning for renal cell carcinoma.   Follows with nephrology for hx of CKD 3.  Current smoker with associated COPD. Maintained on Spiriva and Symbicort.  NIDDM2, A1c 6.2 on 04/20/22.  Pt will need DOS labs and eval.  EKG 05/03/22: Sinus rhythm with occasional PVCs. Rate 92. LAD. Moderate voltage criteria for LVH. Nonspecific T wave abnormality. Prolonged QT (Qtc-Baz 484)/  Cardiac MRI 05/26/22: IMPRESSION: 1. Normal LV size, severe asymmetric septal hypertrophy. 2. Basal septal wall measuring upto 1.8 cm in thickness. 3. Mid wall LGE/scar in the basal septum (area of maximal thickness). 4. No LVOT obstruction noted. 5. Study quality was poor with gating and motion artifact. EF could not be calculated. Appears mildly reduced visually. 6. Findings consistent with HCM, asymmetric septal variant.  TTE 01/26/22: 1. Left ventricular ejection fraction, by estimation, is 50 %. The left   ventricle has low normal function. The left ventricle has no regional wall  motion abnormalities. The left ventricular internal cavity size was  moderately dilated. There is moderate  left ventricular hypertrophy with severe septal hypertrophy. Left  ventricular diastolic parameters are consistent with Grade I diastolic  dysfunction (impaired relaxation). The average left ventricular global  longitudinal strain is -10.4 %.  2. Right ventricular systolic function is normal. The right ventricular  size is normal.  3. Left atrial size was mild to moderately dilated.  4. The mitral valve is normal in structure. No evidence of mitral valve  regurgitation. No evidence of mitral stenosis.  5. The aortic valve was not well visualized. Aortic valve regurgitation  is not visualized. No aortic stenosis is present.  6. There is mild dilatation of the aortic root, measuring 40 mm. There is  borderline dilatation of the ascending aorta, measuring 37 mm.  7. The inferior vena cava is normal in size with greater than 50%  respiratory variability, suggesting right atrial pressure of 3 mmHg.   Nuclear stress 07/28/21:  Abnormal pharmacologic myocardial perfusion stress test.  There is a moderate in size, mild in severity, partially reversible mid inferior, mid inferolateral, and apical inferior defect that may represent scar with periinfarct ischemia but cannot rule out an element of artifact.  Left ventricular systolic function is severely reduced (LVEF 25-35%) with dilation of the left ventricle.  There is no significant coronary artery calcification. Minimal aortic atherosclarosis is noted.  This is a high-risk study due to severely reduced LVEF.  )        Anesthesia Quick Evaluation

## 2022-06-22 NOTE — Progress Notes (Signed)
SDW CALL  Patient was given pre-op instructions over the phone. Patient verbalized understanding of instructions provided.   PCP - Dr. Karle Plumber Cardiologist - Jackelyn Hoehn, Aura Fey, FNP/Dr. Gardiner Rhyme  Chest x-ray - 04/23/22 EKG - 05/03/22 Stress Test - 07/28/21 ECHO - 02/05/2022 Cardiac Cath - 2015  Pt reports he hasn't had any medications in the last week--instructed him on the importance of continuing heart medications. Pt states he doesn't want to take "any of them" the morning of surgery  No Smoking x24 hours. Last mariajuana use this morning. Reports no other illicit drug use.   Fasting Blood Sugar - 90's Checks Blood Sugar 2x a day. Reports he hasn't had [place] in "awhile"  Blood Thinner Instructions: No NSAIDs Aspirin Instructions:   ERAS Protcol - Clears until 915 PRE-SURGERY Ensure or G2-   COVID TEST- n/a  Anesthesia review: Yes  Patient denies shortness of breath, fever, cough and chest pain over the phone call

## 2022-06-23 ENCOUNTER — Encounter (HOSPITAL_COMMUNITY)
Admission: RE | Disposition: A | Discharge status: Home / Self Care | Payer: Self-pay | Source: Home / Self Care | Attending: Interventional Radiology

## 2022-06-23 ENCOUNTER — Encounter (HOSPITAL_COMMUNITY): Payer: Self-pay | Admitting: Interventional Radiology

## 2022-06-23 ENCOUNTER — Ambulatory Visit (HOSPITAL_COMMUNITY): Payer: Medicaid Other | Admitting: Physician Assistant

## 2022-06-23 ENCOUNTER — Ambulatory Visit (HOSPITAL_BASED_OUTPATIENT_CLINIC_OR_DEPARTMENT_OTHER): Payer: Medicaid Other | Admitting: Physician Assistant

## 2022-06-23 ENCOUNTER — Ambulatory Visit (HOSPITAL_COMMUNITY)
Admission: RE | Admit: 2022-06-23 | Discharge: 2022-06-23 | Disposition: A | Discharge status: Home / Self Care | Payer: Medicaid Other | Source: Ambulatory Visit | Attending: Interventional Radiology | Admitting: Interventional Radiology

## 2022-06-23 ENCOUNTER — Ambulatory Visit (HOSPITAL_COMMUNITY)
Admission: RE | Admit: 2022-06-23 | Discharge: 2022-06-23 | Disposition: A | Discharge status: Home / Self Care | Payer: Medicaid Other | Attending: Interventional Radiology | Admitting: Interventional Radiology

## 2022-06-23 ENCOUNTER — Other Ambulatory Visit: Payer: Self-pay

## 2022-06-23 DIAGNOSIS — F1290 Cannabis use, unspecified, uncomplicated: Secondary | ICD-10-CM | POA: Diagnosis not present

## 2022-06-23 DIAGNOSIS — Z833 Family history of diabetes mellitus: Secondary | ICD-10-CM | POA: Insufficient documentation

## 2022-06-23 DIAGNOSIS — N189 Chronic kidney disease, unspecified: Secondary | ICD-10-CM

## 2022-06-23 DIAGNOSIS — N183 Chronic kidney disease, stage 3 unspecified: Secondary | ICD-10-CM | POA: Insufficient documentation

## 2022-06-23 DIAGNOSIS — F17210 Nicotine dependence, cigarettes, uncomplicated: Secondary | ICD-10-CM | POA: Diagnosis not present

## 2022-06-23 DIAGNOSIS — N2889 Other specified disorders of kidney and ureter: Secondary | ICD-10-CM | POA: Diagnosis not present

## 2022-06-23 DIAGNOSIS — J449 Chronic obstructive pulmonary disease, unspecified: Secondary | ICD-10-CM | POA: Insufficient documentation

## 2022-06-23 DIAGNOSIS — I2510 Atherosclerotic heart disease of native coronary artery without angina pectoris: Secondary | ICD-10-CM

## 2022-06-23 DIAGNOSIS — E1122 Type 2 diabetes mellitus with diabetic chronic kidney disease: Secondary | ICD-10-CM | POA: Diagnosis not present

## 2022-06-23 DIAGNOSIS — I5030 Unspecified diastolic (congestive) heart failure: Secondary | ICD-10-CM | POA: Diagnosis not present

## 2022-06-23 DIAGNOSIS — I130 Hypertensive heart and chronic kidney disease with heart failure and stage 1 through stage 4 chronic kidney disease, or unspecified chronic kidney disease: Secondary | ICD-10-CM | POA: Diagnosis not present

## 2022-06-23 DIAGNOSIS — C641 Malignant neoplasm of right kidney, except renal pelvis: Secondary | ICD-10-CM

## 2022-06-23 DIAGNOSIS — Z736 Limitation of activities due to disability: Secondary | ICD-10-CM | POA: Insufficient documentation

## 2022-06-23 DIAGNOSIS — F17200 Nicotine dependence, unspecified, uncomplicated: Secondary | ICD-10-CM | POA: Diagnosis not present

## 2022-06-23 DIAGNOSIS — Z8249 Family history of ischemic heart disease and other diseases of the circulatory system: Secondary | ICD-10-CM | POA: Insufficient documentation

## 2022-06-23 DIAGNOSIS — I509 Heart failure, unspecified: Secondary | ICD-10-CM | POA: Diagnosis not present

## 2022-06-23 HISTORY — PX: RADIOLOGY WITH ANESTHESIA: SHX6223

## 2022-06-23 LAB — CBC WITH DIFFERENTIAL/PLATELET
Abs Immature Granulocytes: 0.04 10*3/uL (ref 0.00–0.07)
Basophils Absolute: 0.1 10*3/uL (ref 0.0–0.1)
Basophils Relative: 1 %
Eosinophils Absolute: 0.3 10*3/uL (ref 0.0–0.5)
Eosinophils Relative: 3 %
HCT: 40.7 % (ref 39.0–52.0)
Hemoglobin: 13.5 g/dL (ref 13.0–17.0)
Immature Granulocytes: 0 %
Lymphocytes Relative: 22 %
Lymphs Abs: 2.2 10*3/uL (ref 0.7–4.0)
MCH: 30.6 pg (ref 26.0–34.0)
MCHC: 33.2 g/dL (ref 30.0–36.0)
MCV: 92.3 fL (ref 80.0–100.0)
Monocytes Absolute: 0.9 10*3/uL (ref 0.1–1.0)
Monocytes Relative: 9 %
Neutro Abs: 6.6 10*3/uL (ref 1.7–7.7)
Neutrophils Relative %: 65 %
Platelets: 187 10*3/uL (ref 150–400)
RBC: 4.41 MIL/uL (ref 4.22–5.81)
RDW: 14.8 % (ref 11.5–15.5)
WBC: 10 10*3/uL (ref 4.0–10.5)
nRBC: 0 % (ref 0.0–0.2)

## 2022-06-23 LAB — BASIC METABOLIC PANEL
Anion gap: 9 (ref 5–15)
BUN: 19 mg/dL (ref 6–20)
CO2: 21 mmol/L — ABNORMAL LOW (ref 22–32)
Calcium: 8.5 mg/dL — ABNORMAL LOW (ref 8.9–10.3)
Chloride: 107 mmol/L (ref 98–111)
Creatinine, Ser: 1.75 mg/dL — ABNORMAL HIGH (ref 0.61–1.24)
GFR, Estimated: 44 mL/min — ABNORMAL LOW (ref >60)
Glucose, Bld: 90 mg/dL (ref 70–99)
Potassium: 3.4 mmol/L — ABNORMAL LOW (ref 3.5–5.1)
Sodium: 137 mmol/L (ref 135–145)

## 2022-06-23 LAB — TYPE AND SCREEN
ABO/RH(D): O POS
Antibody Screen: NEGATIVE

## 2022-06-23 LAB — GLUCOSE, CAPILLARY
Glucose-Capillary: 91 mg/dL (ref 70–99)
Glucose-Capillary: 87 mg/dL (ref 70–99)
Glucose-Capillary: 104 mg/dL — ABNORMAL HIGH (ref 70–99)

## 2022-06-23 LAB — PROTIME-INR
INR: 1 (ref 0.8–1.2)
Prothrombin Time: 13 seconds (ref 11.4–15.2)

## 2022-06-23 LAB — ABO/RH: ABO/RH(D): O POS

## 2022-06-23 SURGERY — CT WITH ANESTHESIA
Anesthesia: General

## 2022-06-23 MED ORDER — PHENYLEPHRINE HCL-NACL 20-0.9 MG/250ML-% IV SOLN
INTRAVENOUS | Status: DC | PRN
  Administered 2022-06-23: 50 ug/min via INTRAVENOUS

## 2022-06-23 MED ORDER — DEXAMETHASONE SODIUM PHOSPHATE 10 MG/ML IJ SOLN
INTRAMUSCULAR | Status: DC | PRN
  Administered 2022-06-23: 10 mg via INTRAVENOUS

## 2022-06-23 MED ORDER — ACETAMINOPHEN 10 MG/ML IV SOLN
1000.0000 mg | Freq: Once | INTRAVENOUS | Status: DC | PRN
  Administered 2022-06-23: 1000 mg via INTRAVENOUS

## 2022-06-23 MED ORDER — CHLORHEXIDINE GLUCONATE 0.12 % MT SOLN
15.0000 mL | Freq: Once | OROMUCOSAL | Status: AC
  Administered 2022-06-23: 15 mL via OROMUCOSAL
  Filled 2022-06-23: qty 15

## 2022-06-23 MED ORDER — PHENYLEPHRINE 80 MCG/ML (10ML) SYRINGE FOR IV PUSH (FOR BLOOD PRESSURE SUPPORT)
PREFILLED_SYRINGE | INTRAVENOUS | Status: DC | PRN
  Administered 2022-06-23 (×3): 80 ug via INTRAVENOUS

## 2022-06-23 MED ORDER — CEFAZOLIN SODIUM-DEXTROSE 2-3 GM-%(50ML) IV SOLR
INTRAVENOUS | Status: DC | PRN
  Administered 2022-06-23: 2 g via INTRAVENOUS

## 2022-06-23 MED ORDER — LIDOCAINE 2% (20 MG/ML) 5 ML SYRINGE
INTRAMUSCULAR | Status: DC | PRN
  Administered 2022-06-23: 100 mg via INTRAVENOUS

## 2022-06-23 MED ORDER — OXYCODONE HCL 5 MG PO TABS
5.0000 mg | ORAL_TABLET | ORAL | Status: DC | PRN
  Administered 2022-06-23: 5 mg via ORAL

## 2022-06-23 MED ORDER — CEFAZOLIN SODIUM-DEXTROSE 2-4 GM/100ML-% IV SOLN
INTRAVENOUS | Status: AC
  Filled 2022-06-23: qty 100

## 2022-06-23 MED ORDER — FENTANYL CITRATE (PF) 100 MCG/2ML IJ SOLN
INTRAMUSCULAR | Status: AC
  Filled 2022-06-23: qty 2

## 2022-06-23 MED ORDER — LACTATED RINGERS IV SOLN: INTRAVENOUS | Status: DC

## 2022-06-23 MED ORDER — IOHEXOL 350 MG/ML SOLN
75.0000 mL | Freq: Once | INTRAVENOUS | Status: AC | PRN
  Administered 2022-06-23: 75 mL via INTRAVENOUS

## 2022-06-23 MED ORDER — LABETALOL HCL 5 MG/ML IV SOLN
INTRAVENOUS | Status: AC
  Filled 2022-06-23: qty 4

## 2022-06-23 MED ORDER — ISOSORB DINITRATE-HYDRALAZINE 20-37.5 MG PO TABS
2.0000 | ORAL_TABLET | Freq: Once | ORAL | Status: AC
  Administered 2022-06-23: 1 via ORAL
  Filled 2022-06-23: qty 2

## 2022-06-23 MED ORDER — INSULIN ASPART 100 UNIT/ML IJ SOLN: 0.0000 [IU] | INTRAMUSCULAR | Status: DC | PRN

## 2022-06-23 MED ORDER — PROPOFOL 10 MG/ML IV BOLUS
INTRAVENOUS | Status: DC | PRN
  Administered 2022-06-23: 160 mg via INTRAVENOUS

## 2022-06-23 MED ORDER — OXYCODONE HCL 5 MG PO TABS
5.0000 mg | ORAL_TABLET | Freq: Once | ORAL | Status: AC | PRN
  Administered 2022-06-23: 5 mg via ORAL

## 2022-06-23 MED ORDER — ROCURONIUM BROMIDE 10 MG/ML (PF) SYRINGE
PREFILLED_SYRINGE | INTRAVENOUS | Status: DC | PRN
  Administered 2022-06-23: 80 mg via INTRAVENOUS
  Administered 2022-06-23: 20 mg via INTRAVENOUS

## 2022-06-23 MED ORDER — ORAL CARE MOUTH RINSE: 15.0000 mL | Freq: Once | OROMUCOSAL | Status: AC

## 2022-06-23 MED ORDER — ACETAMINOPHEN 500 MG PO TABS: 1000.0000 mg | ORAL_TABLET | Freq: Once | ORAL | Status: DC | PRN

## 2022-06-23 MED ORDER — ACETAMINOPHEN 10 MG/ML IV SOLN
INTRAVENOUS | Status: AC
  Filled 2022-06-23: qty 100

## 2022-06-23 MED ORDER — CARVEDILOL 3.125 MG PO TABS
ORAL_TABLET | ORAL | Status: AC
  Filled 2022-06-23: qty 2

## 2022-06-23 MED ORDER — OXYCODONE-ACETAMINOPHEN 7.5-325 MG PO TABS: 1.0000 | ORAL_TABLET | ORAL | 0 refills | Status: DC | PRN

## 2022-06-23 MED ORDER — ACETAMINOPHEN 160 MG/5ML PO SOLN: 1000.0000 mg | Freq: Once | ORAL | Status: DC | PRN

## 2022-06-23 MED ORDER — ONDANSETRON HCL 4 MG/2ML IJ SOLN
INTRAMUSCULAR | Status: DC | PRN
  Administered 2022-06-23: 4 mg via INTRAVENOUS

## 2022-06-23 MED ORDER — CARVEDILOL 3.125 MG PO TABS
6.2500 mg | ORAL_TABLET | Freq: Once | ORAL | Status: AC
  Administered 2022-06-23: 6.25 mg via ORAL

## 2022-06-23 MED ORDER — SUGAMMADEX SODIUM 200 MG/2ML IV SOLN
INTRAVENOUS | Status: DC | PRN
  Administered 2022-06-23: 400 mg via INTRAVENOUS

## 2022-06-23 MED ORDER — LABETALOL HCL 5 MG/ML IV SOLN
10.0000 mg | Freq: Once | INTRAVENOUS | Status: AC
  Administered 2022-06-23: 10 mg via INTRAVENOUS

## 2022-06-23 MED ORDER — FENTANYL CITRATE (PF) 100 MCG/2ML IJ SOLN
25.0000 ug | INTRAMUSCULAR | Status: DC | PRN
  Administered 2022-06-23 (×2): 50 ug via INTRAVENOUS

## 2022-06-23 MED ORDER — OXYCODONE HCL 5 MG PO TABS
ORAL_TABLET | ORAL | Status: AC
  Filled 2022-06-23: qty 1

## 2022-06-23 MED ORDER — ALBUTEROL SULFATE HFA 108 (90 BASE) MCG/ACT IN AERS
INHALATION_SPRAY | RESPIRATORY_TRACT | Status: DC | PRN
  Administered 2022-06-23: 2 via RESPIRATORY_TRACT

## 2022-06-23 MED ORDER — OXYCODONE HCL 5 MG/5ML PO SOLN: 5.0000 mg | Freq: Once | ORAL | Status: AC | PRN

## 2022-06-23 NOTE — H&P (Signed)
Chief Complaint: Patient was seen in consultation today for No chief complaint on file.  at the request of Suttle,Dylan J  Referring Physician(s): Suzette Battiest  Supervising Physician: Ruthann Cancer  Patient Status: [facility] - Out-pt  History of Present Illness: Chris [name] is a 62 y.o. male with history of recently diagnosed right renal mass concerning for renal cell carcinoma.  He was admitted at [facility] for acute respiratory failure and due to CKD III received a renal ultrasound on 04/22/22 which incidentally noted a 2.9 cm solid mass.  This was further evaluated by MRI on 04/23/22 which demonstrated an enhancing mass concerning for renal cell carcinoma.   His respiratory symptoms have improved significantly since discharge home.  He continues to deny any symptoms related to his mass including pain, discomfort, hematuria, or any history of urinary tract infections.  In consultation with Dr. [name] patient expressed interest in proceeding with percutaneous biopsy and ablation.  He presents today for CT and US guided right renal mass biopsy and microwave ablation.  He has held his ASA and is NPO.  Past Medical History:  Diagnosis Date   Arrhythmia    atrial flutter   CHF (congestive heart failure) (HCC)    Chronic kidney disease    COPD (chronic obstructive pulmonary disease) ([place])    Coronary artery disease    Depression    Diabetes mellitus without complication ([place])    GERD (gastroesophageal reflux disease)    Gout    Hypertension    Influenza A with respiratory manifestations    Mental disorder     Past Surgical History:  Procedure Laterality Date   ANKLE SURGERY     CARDIAC CATHETERIZATION     CARDIOVERSION N/A 07/08/2020   Procedure: CARDIOVERSION;  Surgeon: Corey Skains, MD;  Location: [facility] ORS;  Service: Cardiovascular;  Laterality: N/A;   COLONOSCOPY WITH PROPOFOL N/A 11/19/2021   Procedure: COLONOSCOPY WITH PROPOFOL;  Surgeon: Daryel November, MD;   Location: [facility];  Service: Gastroenterology;  Laterality: N/A;   HERNIA REPAIR     x2   IR RADIOLOGIST EVAL & MGMT  05/13/2022   POLYPECTOMY  11/19/2021   Procedure: POLYPECTOMY;  Surgeon: Daryel November, MD;  Location: [facility] ENDOSCOPY;  Service: Gastroenterology;;   SHOULDER SURGERY     TEE WITHOUT CARDIOVERSION N/A 07/08/2020   Procedure: TRANSESOPHAGEAL ECHOCARDIOGRAM (TEE);  Surgeon: Corey Skains, MD;  Location: [facility] ORS;  Service: Cardiovascular;  Laterality: N/A;    Allergies: Patient has no known allergies.  Medications: Prior to Admission medications   Medication Sig Start Date End Date Taking? Authorizing Provider  Accu-Chek Softclix Lancets lancets Use as instructed 04/28/22   [name], MD  albuterol (PROVENTIL) (2.5 MG/3ML) 0.083% nebulizer solution Take 3 mLs (2.5 mg total) by nebulization every 4 (four) hours as needed for wheezing or shortness of breath. 04/26/22   [name], MD  albuterol (VENTOLIN HFA) 108 (90 Base) MCG/ACT inhaler Inhale 2 puffs into the lungs every 6 (six) hours as needed for wheezing or shortness of breath. 04/26/22   [name], MD  allopurinol (ZYLOPRIM) 100 MG tablet TAKE 2 TABLETS (200 MG TOTAL) BY MOUTH DAILY. 05/12/22   [name], MD  aspirin EC 81 MG tablet Take 1 tablet (81 mg total) by mouth daily. 04/26/22   [name], MD  atorvastatin (LIPITOR) 40 MG tablet Take 1 tablet (40 mg total) by mouth daily. 06/10/22   Alisa Graff, FNP  Blood Glucose Monitoring Suppl (  ACCU-CHEK GUIDE) w/Device KIT Use as directed 04/28/22   [name], MD  budesonide-formoterol [facility]) 160-4.5 MCG/ACT inhaler Inhale 2 puffs into the lungs 2 (two) times daily. 04/26/22   [name], MD  carvedilol (COREG) 6.25 MG tablet Take 1 tablet (6.25 mg total) by mouth 2 (two) times daily. 06/10/22   Alisa Graff, FNP  chlorpheniramine-HYDROcodone (TUSSIONEX) 10-8 MG/5ML Take 5 mLs by mouth every 12  (twelve) hours as needed for cough. Patient not taking: Reported on 06/17/2022 04/26/22   [name], MD  dapagliflozin propanediol (FARXIGA) 10 MG TABS tablet Take 1 tablet (10 mg total) by mouth daily. 06/10/22   Alisa Graff, FNP  DULoxetine (CYMBALTA) 20 MG capsule Take 1 capsule (20 mg total) by mouth daily. 05/11/22   [name], MD  glucose blood (ACCU-CHEK GUIDE) test strip Use as directed to check blood sugar 1-2 times a day 04/28/22   [name], MD  isosorbide-hydrALAZINE (BIDIL) 20-37.5 MG tablet Take 2 tablets by mouth 3 (three) times daily. 06/10/22   Alisa Graff, FNP  montelukast (SINGULAIR) 10 MG tablet TAKE 1 TABLET (10 MG TOTAL) BY MOUTH AT BEDTIME. 04/26/22 04/26/23  [name], MD  Tiotropium Bromide Monohydrate (SPIRIVA RESPIMAT) 2.5 MCG/ACT AERS Inhale 2 puffs into the lungs daily. 04/26/22   [name], MD  torsemide (DEMADEX) 20 MG tablet Take 1 tablet (20 mg total) by mouth daily. 06/10/22   Alisa Graff, FNP  metoprolol tartrate (LOPRESSOR) 100 MG tablet Take 1 tablet (100 mg total) by mouth 2 (two) times daily. 07/09/20 07/11/20  [name], MD     Family History  Problem Relation Age of Onset   Heart disease Father    Diabetes Mother    HIV Brother    Healthy Son    Healthy Daughter     Social History   Socioeconomic History   Marital status: Divorced    Spouse name: Not on file   Number of children: 3   Years of education: Not on file   Highest education level: High school graduate  Occupational History   Occupation: disability  Tobacco Use   Smoking status: Every Day    Packs/day: 1.00    Years: 43.00    Total pack years: 43.00    Types: Cigarettes   Smokeless tobacco: Never   Tobacco comments:        4 cigs daily--05/12/2022  Vaping Use   Vaping Use: Never used  Substance and Sexual Activity   Alcohol use: No   Drug use: Yes    Frequency: 21.0 times per week    Types: Marijuana, Cocaine     Comment: last use Cocaine- 03/28/2021. Still using marijuana, last use 06/22/21   Sexual activity: Not on file  Other Topics Concern   Not on file  Social History Narrative   ** Merged History Encounter **       Social Determinants of Health   Financial Resource Strain: Medium Risk (06/19/2021)   Overall Financial Resource Strain (CARDIA)    Difficulty of Paying Living Expenses: Somewhat hard  Food Insecurity: No Food Insecurity (04/20/2022)   Hunger Vital Sign    Worried About Running Out of Food in the Last Year: Never true    Ran Out of Food in the Last Year: Never true  Transportation Needs: No Transportation Needs (04/23/2022)   PRAPARE - Hydrologist (Medical): No    Lack of Transportation (Non-Medical): No  Recent  Concern: Transportation Needs - Unmet Transportation Needs (04/20/2022)   PRAPARE - Hydrologist (Medical): Yes    Lack of Transportation (Non-Medical): Yes  Physical Activity: Insufficiently Active (11/16/2021)   Exercise Vital Sign    Days of Exercise per Week: 5 days    Minutes of Exercise per Session: 20 min  Stress: Not on file  Social Connections: Moderately Isolated (04/28/2021)   Social Connection and Isolation Panel [NHANES]    Frequency of Communication with Friends and Family: More than three times a week    Frequency of Social Gatherings with Friends and Family: Once a week    Attends Religious Services: More than 4 times per year    Active Member of [employer] or Organizations: No    Attends Archivist Meetings: Never    Marital Status: Divorced    Review of Systems: A 12 point ROS discussed and pertinent positives are indicated in the HPI above.  All other systems are negative.  Vital Signs: BP:133/85. HR: 78 RR: 18, Temp 97.7, SpO2 98%  Physical Exam Vitals reviewed.  Constitutional:      General: He is awake.     Comments: Insists on keeping eyes closed during conversation and exam,  stating that he's tired but can hear fine. Physical exam limited by patient level of participation.  HENT:     Head: Normocephalic.     Mouth/Throat:     Pharynx: Oropharynx is clear.  Cardiovascular:     Rate and Rhythm: Normal rate and regular rhythm.     Pulses: Normal pulses.  Pulmonary:     Effort: Pulmonary effort is normal.     Breath sounds: Wheezing present.  Skin:    General: Skin is warm and dry.  Neurological:     General: No focal deficit present.  Psychiatric:        Behavior: Behavior is uncooperative.     Imaging: MR CARDIAC MORPHOLOGY W WO CONTRAST  Result Date: 05/26/2022 CLINICAL DATA:  Evaluate for HCM EXAM: CARDIAC MRI TECHNIQUE: The patient was scanned on a 1.5 Tesla Siemens magnet. A dedicated cardiac coil was used. Functional imaging was done using Fiesta sequences. 2,3, and 4 chamber views were done to assess for RWMA's. Modified Simpson's rule using a short axis stack was used to calculate an ejection fraction on a dedicated work Conservation officer, nature. The patient received 14 cc of Gadavist. After 10 minutes inversion recovery sequences were used to assess for infiltration and scar tissue. CONTRAST:  14 cc  of Gadavist FINDINGS: 1. Normal left ventricular size, severe asymmetric septal wall thickness. Systolic function could not be determined due to poor quality of study and artifacts. LVEF appears mildly reduced visually. There is late gadolinium enhancement in the left ventricular myocardium basal septal mid wall. No LVOT obstruction noted. 2. Normal right ventricular size, thickness and systolic function appear normal. Unable to calculate RVEF due to study quality. 3.  Normal left and right atrial size. 4. Normal size of the aortic root, ascending aorta and pulmonary artery. 5.  No significant valvular abnormalities. 6.  Normal pericardium.  No pericardial effusion. IMPRESSION: 1.  Normal LV size, severe asymmetric septal hypertrophy. 2.  Basal septal  wall measuring upto 1.8 cm in thickness. 3. Mid wall LGE/scar in the basal septum (area of maximal thickness). 4.  No LVOT obstruction noted. 5. Study quality was poor with gating and motion artifact. EF could not be calculated. Appears mildly reduced visually. 6.  Findings consistent with HCM, asymmetric septal variant. Electronically Signed   By: [name] M.D.   On: 05/26/2022 18:25    Labs:  CBC: Recent Labs    04/19/22 1712 04/20/22 0607 04/21/22 0441 05/03/22 1040 06/23/22 0946  WBC 9.5 8.3 12.2*  --  10.0  HGB 14.8 15.0 14.2 15.8 13.5  HCT 45.7 45.4 43.4 49.3 40.7  PLT 196 211 202  --  187    COAGS: Recent Labs    06/23/22 0946  INR 1.0    BMP: Recent Labs    04/21/22 0441 04/22/22 0533 04/24/22 0759 04/26/22 0633 06/23/22 0946  NA 138 137 138  --  137  K 4.6 4.9 4.9  --  3.4*  CL 106 103 107  --  107  CO2 '25 26 28  '$ --  21*  GLUCOSE 105* 128* 126*  --  90  BUN 50* 57* 51*  --  19  CALCIUM 8.8* 9.0 8.9  --  8.5*  CREATININE 2.20* 1.92* 1.93* 2.08* 1.75*  GFRNONAA 34* 40* 39* 36* 44*    LIVER FUNCTION TESTS: Recent Labs    04/19/22 1605  BILITOT 0.7  AST 29  ALT 21  ALKPHOS 78  PROT 8.1  ALBUMIN 4.0    Assessment and Plan:  Right renal tumor --concerning for renal cell carcinoma --Pt electing for renal tumor biopsy and same day ablation.  Pt asks about overnight stay and patient is reminded this is typically an outpatient procedure.  He confirms his brother will be able to pick him up and stay with him 24 hours post procedure. --labs, imaging, vitals, and medication reviewed.  Since no meds have been taken in 3 days and BP was elevated at presentation, at home BP meds administered, in agreement with anesthesiology. --will plan to proceed with procedure with the assistance of General Anesthesia.    Risks and benefits of image guided renal microwave ablation and biopsy was discussed with the patient including, but not limited to, failure to  treat entire lesion, bleeding, infection, damage to adjacent structures, hematuria, urine leak, decrease in renal function or post procedural neuropathy.  All of the patient's questions were answered and the patient is agreeable to proceed. Consent signed and in chart.   Thank you for this interesting consult.  I greatly enjoyed meeting Chris [name] and look forward to participating in their care.  A copy of this report was sent to the requesting provider on this date.  Electronically Signed: Pasty Spillers, PA 06/23/2022, 12:29 PM   I spent a total of 40 Minutes in face to face in clinical consultation, greater than 50% of which was counseling/coordinating care for suspected renal cell carcinoma

## 2022-06-23 NOTE — Procedures (Signed)
Interventional Radiology Procedure Note  Procedure:  1) CT and ultrasound guided renal mass biopsy 2) CT and ultrasound guided renal mass microwave ablation  Findings: Please refer to procedural dictation for full description.18 ga core x1 from right renal mass.  8 min, 35 W ablation.  Complications: None immediate  Estimated Blood Loss: < 5 mL  Recommendations: 3 hour bedrest. Plan for discharge home today.  Ruthann Cancer, MD Pager: [phone]

## 2022-06-23 NOTE — Anesthesia Procedure Notes (Signed)
Procedure Name: Intubation Date/Time: 06/23/2022 1:24 PM  Performed by: Dorthea Cove, CRNAPre-anesthesia Checklist: Patient identified, Emergency Drugs available, Suction available and Patient being monitored Patient Re-evaluated:Patient Re-evaluated prior to induction Oxygen Delivery Method: Circle system utilized Preoxygenation: Pre-oxygenation with 100% oxygen Induction Type: IV induction Ventilation: Mask ventilation without difficulty Laryngoscope Size: Mac and 4 Grade View: Grade II Tube type: Oral Tube size: 7.5 mm Number of attempts: 1 Airway Equipment and Method: Stylet and Oral airway Placement Confirmation: ETT inserted through vocal cords under direct vision, positive ETCO2 and breath sounds checked- equal and bilateral Secured at: 23 cm Tube secured with: Tape Dental Injury: Teeth and Oropharynx as per pre-operative assessment

## 2022-06-23 NOTE — Sedation Documentation (Signed)
Pt transported to PACU bay 14 via stretcher accompanied by RN and CRNA. Handoff complete. Pt drowsy but easily arousable and responds appropriately and follows commands. No s/s of distress at this time.

## 2022-06-23 NOTE — Transfer of Care (Signed)
Immediate Anesthesia Transfer of Care Note  Patient: Chris [name]  Procedure(s) Performed: Right renal tumor ablation  Patient Location: PACU  Anesthesia Type:General  Level of Consciousness: awake, alert , and oriented  Airway & Oxygen Therapy: Patient Spontanous Breathing  Post-op Assessment: Report given to RN and Post -op Vital signs reviewed and stable  Post vital signs: Reviewed and stable  Last Vitals:  Vitals Value Taken Time  BP 134/95 06/23/22 1521  Temp 36.2 C 06/23/22 1521  Pulse 76 06/23/22 1523  Resp 14 06/23/22 1523  SpO2 93 % 06/23/22 1523  Vitals shown include unvalidated device data.  Last Pain:  Vitals:   06/23/22 1521  PainSc: 0-No pain         Complications: No notable events documented.

## 2022-06-23 NOTE — H&P (Deleted)
  The note originally documented on this encounter has been moved the the encounter in which it belongs.

## 2022-06-24 ENCOUNTER — Other Ambulatory Visit: Payer: Self-pay | Admitting: Radiology

## 2022-06-24 ENCOUNTER — Other Ambulatory Visit (HOSPITAL_COMMUNITY): Payer: Self-pay | Admitting: Physician Assistant

## 2022-06-24 ENCOUNTER — Encounter (HOSPITAL_COMMUNITY): Payer: Self-pay | Admitting: Interventional Radiology

## 2022-06-24 ENCOUNTER — Other Ambulatory Visit: Payer: Self-pay | Admitting: Physician Assistant

## 2022-06-24 MED ORDER — OXYCODONE-ACETAMINOPHEN 7.5-325 MG PO TABS: 1.0000 | ORAL_TABLET | ORAL | 0 refills | Status: DC | PRN

## 2022-06-24 NOTE — Anesthesia Postprocedure Evaluation (Signed)
Anesthesia Post Note  Patient: Chris [name]  Procedure(s) Performed: Right renal tumor ablation     Patient location during evaluation: PACU Anesthesia Type: General Level of consciousness: awake and alert, oriented and patient cooperative Pain management: pain level controlled Vital Signs Assessment: post-procedure vital signs reviewed and stable Respiratory status: spontaneous breathing, nonlabored ventilation and respiratory function stable Cardiovascular status: blood pressure returned to baseline and stable Postop Assessment: no apparent nausea or vomiting Anesthetic complications: no   No notable events documented.  Last Vitals:  Vitals:   06/23/22 1700 06/23/22 1800  BP: (!) 137/102   Pulse: 69 75  Resp: (!) 21 (!) 22  Temp:  (!) 36.2 C  SpO2: 97% 98%    Last Pain:  Vitals:   06/23/22 1800  PainSc: [place]

## 2022-06-27 LAB — SURGICAL PATHOLOGY

## 2022-06-29 ENCOUNTER — Emergency Department (HOSPITAL_COMMUNITY)
Admission: EM | Admit: 2022-06-29 | Discharge: 2022-06-29 | Disposition: A | Discharge status: Home / Self Care | Payer: Medicaid Other | Attending: Emergency Medicine | Admitting: Emergency Medicine

## 2022-06-29 ENCOUNTER — Emergency Department (HOSPITAL_COMMUNITY): Payer: Medicaid Other

## 2022-06-29 ENCOUNTER — Other Ambulatory Visit: Payer: Self-pay

## 2022-06-29 DIAGNOSIS — J9811 Atelectasis: Secondary | ICD-10-CM | POA: Diagnosis not present

## 2022-06-29 DIAGNOSIS — N183 Chronic kidney disease, stage 3 unspecified: Secondary | ICD-10-CM | POA: Insufficient documentation

## 2022-06-29 DIAGNOSIS — Z1152 Encounter for screening for COVID-19: Secondary | ICD-10-CM | POA: Insufficient documentation

## 2022-06-29 DIAGNOSIS — Z7982 Long term (current) use of aspirin: Secondary | ICD-10-CM | POA: Diagnosis not present

## 2022-06-29 DIAGNOSIS — G8918 Other acute postprocedural pain: Secondary | ICD-10-CM

## 2022-06-29 DIAGNOSIS — S199XXA Unspecified injury of neck, initial encounter: Secondary | ICD-10-CM | POA: Diagnosis not present

## 2022-06-29 DIAGNOSIS — R531 Weakness: Secondary | ICD-10-CM | POA: Diagnosis not present

## 2022-06-29 DIAGNOSIS — E1122 Type 2 diabetes mellitus with diabetic chronic kidney disease: Secondary | ICD-10-CM | POA: Insufficient documentation

## 2022-06-29 DIAGNOSIS — R109 Unspecified abdominal pain: Secondary | ICD-10-CM | POA: Insufficient documentation

## 2022-06-29 DIAGNOSIS — M47812 Spondylosis without myelopathy or radiculopathy, cervical region: Secondary | ICD-10-CM | POA: Diagnosis not present

## 2022-06-29 DIAGNOSIS — R062 Wheezing: Secondary | ICD-10-CM

## 2022-06-29 DIAGNOSIS — Z79899 Other long term (current) drug therapy: Secondary | ICD-10-CM | POA: Diagnosis not present

## 2022-06-29 DIAGNOSIS — S0993XA Unspecified injury of face, initial encounter: Secondary | ICD-10-CM | POA: Diagnosis not present

## 2022-06-29 DIAGNOSIS — R55 Syncope and collapse: Secondary | ICD-10-CM | POA: Diagnosis not present

## 2022-06-29 DIAGNOSIS — I10 Essential (primary) hypertension: Secondary | ICD-10-CM | POA: Diagnosis not present

## 2022-06-29 DIAGNOSIS — R079 Chest pain, unspecified: Secondary | ICD-10-CM | POA: Diagnosis not present

## 2022-06-29 DIAGNOSIS — R0602 Shortness of breath: Secondary | ICD-10-CM | POA: Diagnosis not present

## 2022-06-29 DIAGNOSIS — Z7984 Long term (current) use of oral hypoglycemic drugs: Secondary | ICD-10-CM | POA: Insufficient documentation

## 2022-06-29 DIAGNOSIS — I6522 Occlusion and stenosis of left carotid artery: Secondary | ICD-10-CM | POA: Diagnosis not present

## 2022-06-29 DIAGNOSIS — R0689 Other abnormalities of breathing: Secondary | ICD-10-CM | POA: Diagnosis not present

## 2022-06-29 DIAGNOSIS — N289 Disorder of kidney and ureter, unspecified: Secondary | ICD-10-CM | POA: Diagnosis not present

## 2022-06-29 DIAGNOSIS — I2510 Atherosclerotic heart disease of native coronary artery without angina pectoris: Secondary | ICD-10-CM | POA: Diagnosis not present

## 2022-06-29 DIAGNOSIS — R0789 Other chest pain: Secondary | ICD-10-CM | POA: Diagnosis not present

## 2022-06-29 DIAGNOSIS — I509 Heart failure, unspecified: Secondary | ICD-10-CM | POA: Insufficient documentation

## 2022-06-29 DIAGNOSIS — M419 Scoliosis, unspecified: Secondary | ICD-10-CM | POA: Diagnosis not present

## 2022-06-29 LAB — TROPONIN I (HIGH SENSITIVITY)
Troponin I (High Sensitivity): 37 ng/L — ABNORMAL HIGH (ref <18)
Troponin I (High Sensitivity): 33 ng/L — ABNORMAL HIGH (ref <18)

## 2022-06-29 LAB — CBC WITH DIFFERENTIAL/PLATELET
Abs Immature Granulocytes: 0.12 10*3/uL — ABNORMAL HIGH (ref 0.00–0.07)
Basophils Absolute: 0.1 10*3/uL (ref 0.0–0.1)
Basophils Relative: 1 %
Eosinophils Absolute: 0.1 10*3/uL (ref 0.0–0.5)
Eosinophils Relative: 1 %
HCT: 44.2 % (ref 39.0–52.0)
Hemoglobin: 14.4 g/dL (ref 13.0–17.0)
Immature Granulocytes: 1 %
Lymphocytes Relative: 18 %
Lymphs Abs: 1.8 10*3/uL (ref 0.7–4.0)
MCH: 29.4 pg (ref 26.0–34.0)
MCHC: 32.6 g/dL (ref 30.0–36.0)
MCV: 90.4 fL (ref 80.0–100.0)
Monocytes Absolute: 0.7 10*3/uL (ref 0.1–1.0)
Monocytes Relative: 7 %
Neutro Abs: 7.4 10*3/uL (ref 1.7–7.7)
Neutrophils Relative %: 72 %
Platelets: 240 10*3/uL (ref 150–400)
RBC: 4.89 MIL/uL (ref 4.22–5.81)
RDW: 14.5 % (ref 11.5–15.5)
WBC: 10.1 10*3/uL (ref 4.0–10.5)
nRBC: 0 % (ref 0.0–0.2)

## 2022-06-29 LAB — COMPREHENSIVE METABOLIC PANEL
ALT: 16 U/L (ref 0–44)
AST: 16 U/L (ref 15–41)
Albumin: 3.3 g/dL — ABNORMAL LOW (ref 3.5–5.0)
Alkaline Phosphatase: 65 U/L (ref 38–126)
Anion gap: 10 (ref 5–15)
BUN: 23 mg/dL — ABNORMAL HIGH (ref 6–20)
CO2: 23 mmol/L (ref 22–32)
Calcium: 8.9 mg/dL (ref 8.9–10.3)
Chloride: 106 mmol/L (ref 98–111)
Creatinine, Ser: 1.7 mg/dL — ABNORMAL HIGH (ref 0.61–1.24)
GFR, Estimated: 46 mL/min — ABNORMAL LOW (ref >60)
Glucose, Bld: 101 mg/dL — ABNORMAL HIGH (ref 70–99)
Potassium: 3.6 mmol/L (ref 3.5–5.1)
Sodium: 139 mmol/L (ref 135–145)
Total Bilirubin: 0.3 mg/dL (ref 0.3–1.2)
Total Protein: 6.8 g/dL (ref 6.5–8.1)

## 2022-06-29 LAB — CBG MONITORING, ED: Glucose-Capillary: 89 mg/dL (ref 70–99)

## 2022-06-29 LAB — BRAIN NATRIURETIC PEPTIDE: B Natriuretic Peptide: 872.8 pg/mL — ABNORMAL HIGH (ref 0.0–100.0)

## 2022-06-29 LAB — RESP PANEL BY RT-PCR (RSV, FLU A&B, COVID)  RVPGX2
Influenza A by PCR: NEGATIVE
Influenza B by PCR: NEGATIVE
Resp Syncytial Virus by PCR: NEGATIVE
SARS Coronavirus 2 by RT PCR: NEGATIVE

## 2022-06-29 LAB — LIPASE, BLOOD: Lipase: 23 U/L (ref 11–51)

## 2022-06-29 MED ORDER — SODIUM CHLORIDE 0.9 % IV BOLUS
500.0000 mL | Freq: Once | INTRAVENOUS | Status: AC
  Administered 2022-06-29: 500 mL via INTRAVENOUS

## 2022-06-29 MED ORDER — METHYLPREDNISOLONE 4 MG PO TBPK: ORAL_TABLET | ORAL | 0 refills | Status: DC

## 2022-06-29 MED ORDER — HYDROMORPHONE HCL 1 MG/ML IJ SOLN
1.0000 mg | Freq: Once | INTRAMUSCULAR | Status: AC
  Administered 2022-06-29: 1 mg via INTRAVENOUS
  Filled 2022-06-29: qty 1

## 2022-06-29 MED ORDER — IOHEXOL 350 MG/ML SOLN
100.0000 mL | Freq: Once | INTRAVENOUS | Status: AC | PRN
  Administered 2022-06-29: 100 mL via INTRAVENOUS

## 2022-06-29 MED ORDER — OXYCODONE-ACETAMINOPHEN 5-325 MG PO TABS: 1.0000 | ORAL_TABLET | Freq: Four times a day (QID) | ORAL | 0 refills | Status: DC | PRN

## 2022-06-29 NOTE — ED Triage Notes (Signed)
EMS stated coming from [facility] because of alcohol. Put on antibiotic finished the antiobiotic yesterday and still having pain on right forearm.

## 2022-06-29 NOTE — ED Provider Notes (Signed)
[facility] EMERGENCY DEPARTMENT Provider Note   CSN: [id number] Arrival date & time: 06/29/22  7353     History  Chief Complaint  Patient presents with   Loss of Consciousness   Chest Pain   Shortness of Breath    Chris [name] is a 62 y.o. male, s/p R renal tumor ablation 06/23/22, h/o RCC, CHF, CKD III, DM, presented with loss of consciousness, chest pain, shortness of breath.  Chest pain is substernal has been present for 3 days.  Patient states that it feels like a pressure is on his chest and that it does not radiate to his back, left shoulder, left jaw, or to his abdomen.  Chest pain is constant and patient could not define any triggers or alleviating factors.  Patient states he has right-sided abdominal pain post surgery and that that is 8/10 constant sharp pain and that the oxycodone he was given post surgery does not touch the pain.  Patient states when he was going to the bathroom this morning he passed out without any presyncopal symptoms and lost consciousness.  Patient states he has been compliant with medications since surgery.  Patient denied leg edema, hemoptysis, nausea/vomiting, diarrhea, recent illnesses, fever, dysuria  Last ECHO: LVEF 50%  Home Medications Prior to Admission medications   Medication Sig Start Date End Date Taking? Authorizing Provider  oxyCODONE-acetaminophen (PERCOCET) 7.5-325 MG tablet Take 1 tablet by mouth every 4 (four) hours as needed for up to 5 days for severe pain. 06/24/22 06/29/22  Candiss Norse A, PA-C  Accu-Chek Softclix Lancets lancets Use as instructed 04/28/22   [name], MD  albuterol (PROVENTIL) (2.5 MG/3ML) 0.083% nebulizer solution Take 3 mLs (2.5 mg total) by nebulization every 4 (four) hours as needed for wheezing or shortness of breath. 04/26/22   [name], MD  albuterol (VENTOLIN HFA) 108 (90 Base) MCG/ACT inhaler Inhale 2 puffs into the lungs every 6 (six) hours as needed for wheezing  or shortness of breath. 04/26/22   [name], MD  allopurinol (ZYLOPRIM) 100 MG tablet TAKE 2 TABLETS (200 MG TOTAL) BY MOUTH DAILY. 05/12/22   [name], MD  aspirin EC 81 MG tablet Take 1 tablet (81 mg total) by mouth daily. 04/26/22   [name], MD  atorvastatin (LIPITOR) 40 MG tablet Take 1 tablet (40 mg total) by mouth daily. 06/10/22   Alisa Graff, FNP  Blood Glucose Monitoring Suppl (ACCU-CHEK GUIDE) w/Device KIT Use as directed 04/28/22   [name], MD  budesonide-formoterol [facility]) 160-4.5 MCG/ACT inhaler Inhale 2 puffs into the lungs 2 (two) times daily. 04/26/22   [name], MD  carvedilol (COREG) 6.25 MG tablet Take 1 tablet (6.25 mg total) by mouth 2 (two) times daily. 06/10/22   Alisa Graff, FNP  chlorpheniramine-HYDROcodone (TUSSIONEX) 10-8 MG/5ML Take 5 mLs by mouth every 12 (twelve) hours as needed for cough. Patient not taking: Reported on 06/17/2022 04/26/22   [name], MD  dapagliflozin propanediol (FARXIGA) 10 MG TABS tablet Take 1 tablet (10 mg total) by mouth daily. 06/10/22   Alisa Graff, FNP  DULoxetine (CYMBALTA) 20 MG capsule Take 1 capsule (20 mg total) by mouth daily. 05/11/22   [name], MD  glucose blood (ACCU-CHEK GUIDE) test strip Use as directed to check blood sugar 1-2 times a day 04/28/22   [name], MD  isosorbide-hydrALAZINE (BIDIL) 20-37.5 MG tablet Take 2 tablets by mouth 3 (three) times daily. 06/10/22   Hackney,  Aura Fey, FNP  montelukast (SINGULAIR) 10 MG tablet TAKE 1 TABLET (10 MG TOTAL) BY MOUTH AT BEDTIME. 04/26/22 04/26/23  [name], MD  Tiotropium Bromide Monohydrate (SPIRIVA RESPIMAT) 2.5 MCG/ACT AERS Inhale 2 puffs into the lungs daily. 04/26/22   [name], MD  torsemide (DEMADEX) 20 MG tablet Take 1 tablet (20 mg total) by mouth daily. 06/10/22   Alisa Graff, FNP  metoprolol tartrate (LOPRESSOR) 100 MG tablet Take 1 tablet (100 mg total) by mouth 2  (two) times daily. 07/09/20 07/11/20  [name], MD      Allergies    Patient has no known allergies.    Review of Systems   Review of Systems  Respiratory:  Positive for shortness of breath.   Cardiovascular:  Positive for chest pain.  See HPI  Physical Exam Updated Vital Signs BP (!) 173/110 (BP Location: Left Arm)   Pulse 68   Temp 97.7 F (36.5 C) (Oral)   Resp 18   Ht 6' (1.829 m)   Wt 123.8 kg   SpO2 100%   BMI 37.03 kg/m  Physical Exam Vitals and nursing note reviewed.  Constitutional:      General: He is not in acute distress.    Appearance: He is obese.  HENT:     Head: Normocephalic and atraumatic.  Eyes:     Extraocular Movements: Extraocular movements intact.     Conjunctiva/sclera: Conjunctivae normal.     Pupils: Pupils are equal, round, and reactive to light.  Cardiovascular:     Rate and Rhythm: Normal rate and regular rhythm.     Pulses: Normal pulses.     Heart sounds: Normal heart sounds. No murmur heard.    Comments: 2+ bilateral radial/posterior tibialis pulses Pulmonary:     Effort: Pulmonary effort is normal. No respiratory distress.     Breath sounds: Normal breath sounds.     Comments: Bilateral wheezing Chest:     Chest wall: No tenderness.  Abdominal:     Palpations: Abdomen is soft. There is fluid wave.     Tenderness: There is abdominal tenderness (Right sided of abdomen). There is no guarding or rebound.     Comments: Not warm to palpation, no skin color changes, no signs of infection/trauma  Musculoskeletal:        General: No swelling. Normal range of motion.     Cervical back: Normal range of motion and neck supple.     Right lower leg: No edema.     Left lower leg: No edema.  Skin:    General: Skin is warm and dry.     Capillary Refill: Capillary refill takes less than 2 seconds.     Findings: No bruising, ecchymosis or erythema.  Neurological:     General: No focal deficit present.     Mental Status: He is alert and  oriented to person, place, and time.     Cranial Nerves: Cranial nerves 2-12 are intact.     Sensory: Sensation is intact.     Motor: Motor function is intact.     Coordination: Coordination is intact.     Comments: Patient denied patient able to bear weight however take small steps and is hunched forward  Psychiatric:        Mood and Affect: Mood normal.     ED Results / Procedures / Treatments   Labs (all labs ordered are listed, but only abnormal results are displayed) Labs Reviewed  CBC WITH DIFFERENTIAL/PLATELET - Abnormal; Notable  for the following components:      Result Value   Abs Immature Granulocytes 0.12 (*)    All other components within normal limits  COMPREHENSIVE METABOLIC PANEL - Abnormal; Notable for the following components:   Glucose, Bld 101 (*)    BUN 23 (*)    Creatinine, Ser 1.70 (*)    Albumin 3.3 (*)    GFR, Estimated 46 (*)    All other components within normal limits  BRAIN NATRIURETIC PEPTIDE - Abnormal; Notable for the following components:   B Natriuretic Peptide 872.8 (*)    All other components within normal limits  TROPONIN I (HIGH SENSITIVITY) - Abnormal; Notable for the following components:   Troponin I (High Sensitivity) 37 (*)    All other components within normal limits  RESP PANEL BY RT-PCR (RSV, FLU A&B, COVID)  [id number]  LIPASE, BLOOD  URINALYSIS, ROUTINE W REFLEX MICROSCOPIC  CBG MONITORING, ED  TROPONIN I (HIGH SENSITIVITY)    EKG None  Radiology CT Head Wo Contrast  Result Date: 06/29/2022 CLINICAL DATA:  Facial trauma, neck trauma. EXAM: CT HEAD WITHOUT CONTRAST CT CERVICAL SPINE WITHOUT CONTRAST TECHNIQUE: Multidetector CT imaging of the head and cervical spine was performed following the standard protocol without intravenous contrast. Multiplanar CT image reconstructions of the cervical spine were also generated. RADIATION DOSE REDUCTION: This exam was performed according to the departmental dose-optimization program which  includes automated exposure control, adjustment of the mA and/or kV according to patient size and/or use of iterative reconstruction technique. COMPARISON:  None Available. FINDINGS: CT HEAD FINDINGS Brain: Ventricles are within normal limits in size and configuration. There is no mass, hemorrhage, edema or other evidence of acute parenchymal abnormality. No extra-axial hemorrhage. Vascular: Chronic calcified atherosclerotic changes of the large vessels at the skull base. No unexpected hyperdense vessel. Skull: Normal. Negative for fracture or focal lesion. Sinuses/Orbits: No acute finding. Other: None. CT CERVICAL SPINE FINDINGS Alignment: Mild dextroscoliosis, possibly accentuated by patient positioning. Straightening of the normal cervical spine lordosis. No evidence of acute/focal vertebral body subluxation. Skull base and vertebrae: Evaluation of osseous detail is limited by patient motion artifact, however, there is no fracture line or displaced fracture fragment identified. Facet joints appear normally aligned throughout. Soft tissues and spinal canal: No prevertebral fluid or swelling. No visible canal hematoma. Disc levels: Degenerative spondylosis of the mid and lower cervical spine, with associated disc space narrowings and osseous spurring at multiple levels, with associated mild to moderate central canal stenosis at the C5-6 level. Upper chest: No acute findings. Other: Aortic atherosclerosis and LEFT carotid artery atherosclerosis. IMPRESSION: 1. No acute intracranial abnormality. No intracranial mass, hemorrhage or edema. No skull fracture. 2. No fracture or acute subluxation within the cervical spine. 3. Degenerative changes of the mid and lower cervical spine, mild to moderate in degree, as detailed above. Aortic Atherosclerosis (ICD10-I70.0). Electronically Signed   By: Franki Cabot M.D.   On: 06/29/2022 11:18   CT Cervical Spine Wo Contrast  Result Date: 06/29/2022 CLINICAL DATA:  Facial  trauma, neck trauma. EXAM: CT HEAD WITHOUT CONTRAST CT CERVICAL SPINE WITHOUT CONTRAST TECHNIQUE: Multidetector CT imaging of the head and cervical spine was performed following the standard protocol without intravenous contrast. Multiplanar CT image reconstructions of the cervical spine were also generated. RADIATION DOSE REDUCTION: This exam was performed according to the departmental dose-optimization program which includes automated exposure control, adjustment of the mA and/or kV according to patient size and/or use of iterative reconstruction technique. COMPARISON:  None  Available. FINDINGS: CT HEAD FINDINGS Brain: Ventricles are within normal limits in size and configuration. There is no mass, hemorrhage, edema or other evidence of acute parenchymal abnormality. No extra-axial hemorrhage. Vascular: Chronic calcified atherosclerotic changes of the large vessels at the skull base. No unexpected hyperdense vessel. Skull: Normal. Negative for fracture or focal lesion. Sinuses/Orbits: No acute finding. Other: None. CT CERVICAL SPINE FINDINGS Alignment: Mild dextroscoliosis, possibly accentuated by patient positioning. Straightening of the normal cervical spine lordosis. No evidence of acute/focal vertebral body subluxation. Skull base and vertebrae: Evaluation of osseous detail is limited by patient motion artifact, however, there is no fracture line or displaced fracture fragment identified. Facet joints appear normally aligned throughout. Soft tissues and spinal canal: No prevertebral fluid or swelling. No visible canal hematoma. Disc levels: Degenerative spondylosis of the mid and lower cervical spine, with associated disc space narrowings and osseous spurring at multiple levels, with associated mild to moderate central canal stenosis at the C5-6 level. Upper chest: No acute findings. Other: Aortic atherosclerosis and LEFT carotid artery atherosclerosis. IMPRESSION: 1. No acute intracranial abnormality. No  intracranial mass, hemorrhage or edema. No skull fracture. 2. No fracture or acute subluxation within the cervical spine. 3. Degenerative changes of the mid and lower cervical spine, mild to moderate in degree, as detailed above. Aortic Atherosclerosis (ICD10-I70.0). Electronically Signed   By: Franki Cabot M.D.   On: 06/29/2022 11:18   DG Hip Unilat W or Wo Pelvis 2-3 Views Right  Result Date: 06/29/2022 CLINICAL DATA:  Fall. Weakness. Found unconscious in bathroom of courthouse. EXAM: DG HIP (WITH OR WITHOUT PELVIS) 2-3V RIGHT COMPARISON:  Pelvis and right hip radiographs 07/13/2017 FINDINGS: There is diffuse decreased bone mineralization. The bilateral sacroiliac joint spaces are maintained. Mild superior right femoroacetabular joint space narrowing. The left femoroacetabular joint space is maintained. Minimal superior pubic symphysis degenerative spurring. Mild right femoral head-neck junction circumferential degenerative osteophytes, similar to prior. No acute fracture is seen.  No dislocation. IMPRESSION: 1. No acute fracture. 2. Mild right femoroacetabular osteoarthritis. Electronically Signed   By: Yvonne Kendall M.D.   On: 06/29/2022 10:43   DG Chest 2 View  Result Date: 06/29/2022 CLINICAL DATA:  Fall. Weakness. Found unconscious in bathroom of courthouse. EXAM: CHEST - 2 VIEW COMPARISON:  Chest two views 04/23/2022, 04/19/2022, 01/04/2022 FINDINGS: Cardiac silhouette is again mildly enlarged. Moderate calcifications are again seen within the aortic arch. Mildly decreased lung volumes with increased bibasilar horizontal linear opacities compared to 04/23/2022, now more similar to 01/04/2022. No definite pleural effusion. No pneumothorax. Mild multilevel degenerative disc changes of the thoracic spine. IMPRESSION: Mildly decreased lung volumes with increased bibasilar atelectasis. Electronically Signed   By: Yvonne Kendall M.D.   On: 06/29/2022 10:41    Procedures Procedures    Medications  Ordered in ED Medications  HYDROmorphone (DILAUDID) injection 1 mg (1 mg Intravenous Given 06/29/22 1439)  sodium chloride 0.9 % bolus 500 mL (500 mLs Intravenous New Bag/Given 06/29/22 1440)    ED Course/ Medical Decision Making/ A&P                             Medical Decision Making  TAYLAN MAREZ 62 y.o. presented today for pain, shortness of breath, syncope. Working DDx that I considered at this time includes, but not limited to, CHF exacerbation, CVA/TIA, ACS, postsurgical complication, PE.  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 with S wave with LVH; no signs of acute ischemia CMP: Decreased renal function however this is baseline BNP: Elevated 872.8 Lipase: Unremarkable CBC with differential: Unremarkable UA: Pending CBG: Pending Respiratory panel: Pending CT head without contrast: Unremarkable for CVA/TIA, cranial hemorrhage/hematoma CT cervical spine without contrast: Unremarkable for any acute osseous changes Hip x-ray: Unremarkable for any acute osseous changes Chest x-ray: Bilateral bibasilar atelectasis CT abdomen pelvis with contrast: pending CT angio chest: pending  Discussion with Independent Historian: None  Discussion of Management of Tests: none at this time  Risk: Cannot be determined at this time  Risk Stratification Score: None  Staffed with Malvin Johns, MD and Domenic Moras, PA-C  R/o DDx: Reubin Milan be determined at this time  Plan: Presented after having chest pain for the past 3 days and having a syncopal episode with shortness of breath that began this morning.  Patient states he did not hit his head and that he does not have any neurologic symptoms.  Patient states chest pain this substernal and does not radiate but feels like pressure.  Patient states abdominal pain is right-sided and that it is a constant excruciating pain.   On exam patient appears uncomfortable and frequently favors right side of his abdomen.  There is also bilateral wheezing noted on exam as well.  After speaking with Malvin Johns, MD and Domenic Moras, PA-C we decided that even though the patient's kidney function is poor at baseline and poor today that we will obtain a CT abdomen pelvis with contrast and CT angio to assess for any postsurgical perforation/complications and PE and that the risks outweigh the benefit.  Patient was informed of this decision along with the risks of giving contrast with his kidney function and verbalized agreement to this plan.  Patient will also be given 500 mL of fluid to help his kidneys.  Patient BNP is also elevated which is suggestive of heart failure however patient does not appear to be fluid overloaded.  Patient's blood pressure is also elevated at 170/101 which is slightly more than what his baseline.  Patient was also given 1 mg of Dilaudid for the pain as patient has been out of postsurgical pain meds and appears uncomfortable.  I suspect the shortness of breath and chest pain is related to the patient's history of CHF and that this is a CHF exacerbation.  On recheck patient's endorsed pain resolution after receiving Dilaudid.  I have a low suspicion this is ACS due to the patient's troponin being lower than normal and an EKG that is similar to previous ones.  Patient was signed out to oncoming team at 1520. Patient is reasonably stable at this time and we are awaiting CT scans.        Final Clinical Impression(s) / ED Diagnoses Final diagnoses:  None    Rx / DC Orders ED Discharge Orders     None         Elvina Sidle 06/29/22 1522    Malvin Johns, MD 06/29/22 1540

## 2022-06-29 NOTE — Discharge Instructions (Signed)
Get help right away if: You have severe pain. You have trouble breathing. You faint, or another person sees you faint. You have chest pain or pressure that lasts for more than a few minutes, or if you have other symptoms along with chest pain, including: Pain or discomfort in one or both arms, your back, neck, jaw, or stomach. Shortness of breath. A cold sweat. Nausea. Feeling light-headed. These symptoms may be an emergency. Get help right away. Call 911.

## 2022-06-29 NOTE — ED Notes (Signed)
Patient Alert and oriented to baseline. Stable and ambulatory to baseline. Patient verbalized understanding of the discharge instructions.  Patient belongings were taken by the patient.   

## 2022-06-29 NOTE — ED Provider Notes (Signed)
Single episode LOC this am s/p renal tumoar ablation recent, + abd/ cp and sob. F/u on CTA  Physical Exam  BP (!) 173/110 (BP Location: Left Arm)   Pulse 68   Temp 97.7 F (36.5 C) (Oral)   Resp 18   Ht 6' (1.829 m)   Wt 123.8 kg   SpO2 100%   BMI 37.03 kg/m   Physical Exam  Procedures  Procedures  ED Course / MDM    Medical Decision Making Amount and/or Complexity of Data Reviewed Radiology: ordered.  Risk Prescription drug management.   I personally visualized and interpreted the images using our PACS system. Acute findings include:  None  Patient requests pain control. PDMP reviewed during this encounter. Dc w percocet  Pt wheezing but would like to go home and use is nebulizer.  The patient appears reasonably screened and/or stabilized for discharge and I doubt any other medical condition or other [facility] requiring further screening, evaluation, or treatment in the ED at this time prior to discharge.         [name], PA-C 07/01/22 1449    Wyvonnia Dusky, MD 07/02/22 [phone]

## 2022-06-29 NOTE — ED Triage Notes (Signed)
EMS stated, [place]  He was semi conscious when we arrived but now fully conscious. Complaining of chest pain and dizziness and a headache. No blood thinners. He had a mass removed from his right kidney 4 days ago and was just released from hospital. His chest pain has been the last 3 days  EMS given Nitro and ASA

## 2022-06-29 NOTE — ED Provider Triage Note (Signed)
Emergency Medicine Provider Triage Evaluation Note  Chris [name] , a 62 y.o. male  was evaluated in triage.  [place].  He was found unconscious in the bathroom.  He tells me that he has a history of vasovagal syncope however he did not have a bowel movement in the restroom.  Says that he has been feeling very woozy over the past couple of days ever since he had resection of a mass from renal cell carcinoma.  Also since he has a history of heart failure.   Review of Systems  Positive:  Negative:   Physical Exam  BP (!) 157/104 (BP Location: Left Arm)   Pulse 79   Temp (!) 97.4 F (36.3 C)   Resp 17   Ht 6' (1.829 m)   Wt 123.8 kg   SpO2 95%   BMI 37.03 kg/m  Gen:   Awake, no distress   Resp:  Normal effort  MSK:   Moves extremities without difficulty  Other:  Normal strength in bilateral upper extremities.  Reduced strength and knee extension of the right lower extremity.  No facial droop, aphasia.  EOMs intact.  Medical Decision Making  Medically screening exam initiated at 9:49 AM.  Appropriate orders placed.  Chris [name] was informed that the remainder of the evaluation will be completed by another provider, this initial triage assessment does not replace that evaluation, and the importance of remaining in the ED until their evaluation is complete.    CT head and neck ordered after a fall, chest x-ray ordered due to chest pain and potential trauma, right hip due to patient's right lower extremity weakness.  Abdominal pain labs due to reports of generalized abdominal pain and no bowel movement in 4 days, troponin secondary to chest pain.  Very broad workup.   Chris Hammock, PA-C 06/29/22 1014

## 2022-06-29 NOTE — ED Triage Notes (Signed)
Pt. Stated, I've had some chest pain and right side weakness for the last 4 days and just not feeling good.

## 2022-07-01 ENCOUNTER — Telehealth: Payer: Self-pay | Admitting: Licensed Clinical Social Worker

## 2022-07-01 NOTE — Patient Outreach (Addendum)
Erroneous Encounter. Chart Review completed by [facility] LCSW per [facility] team's request for re-engagement in program  Eula Fried, [place], MSW, [employer] Managed Medicaid LCSW [email or web address] Phone: [phone]

## 2022-07-08 ENCOUNTER — Other Ambulatory Visit: Payer: Medicaid Other | Admitting: Licensed Clinical Social Worker

## 2022-07-08 DIAGNOSIS — F32A Depression, unspecified: Secondary | ICD-10-CM

## 2022-07-08 NOTE — Patient Outreach (Signed)
  Medicaid Managed Care   Unsuccessful Attempt Note   07/08/2022 Name: Chris [name] MRN: [id number] DOB: [birth year]  Referred by: [name], MD Reason for referral : No chief complaint on file.   An unsuccessful telephone outreach was attempted today. The patient was referred to the case management team for assistance with care management and care coordination.    Follow Up Plan: The Managed Medicaid care management team will reach out to the patient again over the next 30 days.   Eula Fried, [name], MSW, [employer] Managed Medicaid LCSW [email or web address] Phone: [phone]

## 2022-07-08 NOTE — Patient Outreach (Signed)
Medicaid Managed Care Social Work Note  07/08/2022 Name:  Chris [name] MRN:  [id number] DOB:  03/04/1961  Chris [name] is an 62 y.o. year old male who is a primary patient of Chris Pier, MD.  The Medicaid Managed Care Coordination team was consulted for assistance with:  Chris [name] and Resources  Chris [name] was given information about Medicaid Managed Care Coordination team services today. Chris [name] Patient agreed to services and verbal consent obtained.  Engaged with patient  for by telephone forinitial visit in response to referral for case management and/or care coordination services.   Assessments/Interventions:  Review of past medical history, allergies, medications, health status, including review of consultants reports, laboratory and other test data, was performed as part of comprehensive evaluation and provision of chronic care management services.  SDOH: (Social Determinant of Health) assessments and interventions performed: SDOH Interventions    Flowsheet Row Patient Outreach Telephone from 07/08/2022 in [facility] ED to Hosp-Admission (Discharged) from 04/19/2022 in [facility] PCU Patient Outreach Telephone from 11/16/2021 in [facility] Patient Outreach Telephone from 10/13/2021 in [place] Patient Outreach Telephone from 09/11/2021 in [facility] Patient Outreach Telephone from 08/12/2021 in [facility] Coordination  SDOH Interventions        Housing Interventions -- -- -- -- -- Intervention Not Indicated  Transportation Interventions -- Patient Resources (Friends/Family) -- Other (Comment)  [Assisted with arranging transportation to Cardiology appointment via [place] transportation [phone] Other (Comment)  [Discussed and provided  information for medical transportation provided by [employer]] --  Depression Interventions/Treatment  Referral to Psychiatry -- -- -- -- --  Physical Activity Interventions -- -- Intervention Not Indicated -- Intervention Not Indicated --  Stress Interventions Offered [name], Provide Counseling -- -- -- -- --       Advanced Directives Status:  See Care Plan for related entries.  Care Plan                 No Known Allergies  Medications Reviewed Today     Reviewed by Greg Cutter, LCSW (Social Worker) on 07/08/22 at 1054  Med List Status: <None>   Medication Order Taking? Sig Documenting Provider Last Dose Status Informant  Accu-Chek Softclix Lancets lancets [id number] No Use as instructed Chris Pier, MD Taking Active Self  albuterol (PROVENTIL) (2.5 MG/3ML) 0.083% nebulizer solution [id number] No Take 3 mLs (2.5 mg total) by nebulization every 4 (four) hours as needed for wheezing or shortness of breath. [name], MD Past Week Active Self  albuterol (VENTOLIN HFA) 108 (90 Base) MCG/ACT inhaler [id number] No Inhale 2 puffs into the lungs every 6 (six) hours as needed for wheezing or shortness of breath. [name], MD Past Week Active Self  allopurinol (ZYLOPRIM) 100 MG tablet [id number] No TAKE 2 TABLETS (200 MG TOTAL) BY MOUTH DAILY. Chris Pier, MD Past Week Active Self  aspirin EC 81 MG tablet [id number] No Take 1 tablet (81 mg total) by mouth daily. [name], MD Past Week Active Self  atorvastatin (LIPITOR) 40 MG tablet [id number] No Take 1 tablet (40 mg total) by mouth daily. Alisa Graff, FNP Past Week Active Self  Blood Glucose Monitoring Suppl (ACCU-CHEK GUIDE) w/Device [name] [id number] No Use as directed Chris Pier, MD Taking Active Self  budesonide-formoterol [facility]) 160-4.5 MCG/ACT inhaler [id number] No Inhale 2 puffs  into the lungs 2 (two) times daily. [name], MD Past Week Active Self  carvedilol  (COREG) 6.25 MG tablet [id number] No Take 1 tablet (6.25 mg total) by mouth 2 (two) times daily. Alisa Graff, FNP Past Week Active Self  chlorpheniramine-HYDROcodone (TUSSIONEX) 10-8 MG/5ML [id number] No Take 5 mLs by mouth every 12 (twelve) hours as needed for cough.  Patient not taking: Reported on 06/17/2022   [name], MD Not Taking Active Self  dapagliflozin propanediol (FARXIGA) 10 MG TABS tablet [id number] No Take 1 tablet (10 mg total) by mouth daily. Alisa Graff, FNP Past Week Active Self  DULoxetine (CYMBALTA) 20 MG capsule [id number] No Take 1 capsule (20 mg total) by mouth daily. Chris Pier, MD Past Week Active Self           Med Note [name], [name]   Thu Jun 17, 2022  3:57 PM)    glucose blood (ACCU-CHEK GUIDE) test strip [id number] No Use as directed to check blood sugar 1-2 times a day Chris Pier, MD Taking Active Self  isosorbide-hydrALAZINE (BIDIL) 20-37.5 MG tablet [id number] No Take 2 tablets by mouth 3 (three) times daily. Alisa Graff, FNP Past Week Active Self  methylPREDNISolone (MEDROL DOSEPAK) 4 MG TBPK tablet [id number]  Use as directed [name], PA-C  Active   Discontinued 07/11/20 4268 (Discontinued by provider)   montelukast (SINGULAIR) 10 MG tablet [id number] No TAKE 1 TABLET (10 MG TOTAL) BY MOUTH AT BEDTIME. [name], MD Past Week Active Self  oxyCODONE-acetaminophen (PERCOCET) 5-325 MG tablet [id number]  Take 1-2 tablets by mouth every 6 (six) hours as needed for severe pain. [name], PA-C  Active   Tiotropium Bromide Monohydrate (SPIRIVA RESPIMAT) 2.5 MCG/ACT AERS [id number] No Inhale 2 puffs into the lungs daily. [name], MD Past Week Active Self  torsemide (DEMADEX) 20 MG tablet [id number] No Take 1 tablet (20 mg total) by mouth daily. Alisa Graff, FNP Past Week Active Self            Patient Active Problem List   Diagnosis Date Noted   Agitation 04/23/2022   RSV (respiratory syncytial virus  pneumonia) 04/22/2022   Obesity (BMI 30-39.9) 04/21/2022   COPD exacerbation ([place]) 04/19/2022   Dyslipidemia 04/19/2022   Gout 04/19/2022   Right kidney mass 05/14/2021   CHF exacerbation ([place]) 04/14/2021   Chest pain 04/14/2021   Syncope 04/14/2021   Left-sided weakness 10/28/2020   Typical atrial flutter (HCC)    CHF (congestive heart failure) ([place]) 07/04/2020   Acute exacerbation of CHF (congestive heart failure) ([place]) 06/16/2020   Influenza vaccine refused 05/06/2020   Acute on chronic combined systolic (congestive) and diastolic (congestive) heart failure ([place]) 05/05/2020   Acute decompensated heart failure ([place]) 05/04/2020   Illiteracy 05/04/2020   Type 2 diabetes mellitus with stage 3 chronic kidney disease ([place]) 12/25/2019   Acute on chronic combined systolic and diastolic CHF (congestive heart failure) ([place]) 10/26/2019   Elevated troponin I level 10/26/2019   Acute on chronic diastolic CHF (congestive heart failure) ([place]) 10/26/2019   History of gout 02/01/2019   Seasonal allergic rhinitis due to pollen 02/01/2019   Tobacco dependence 11/30/2018   Microscopic hematuria 11/30/2018   Depression 11/30/2018   Difficulty controlling anger 11/30/2018   COPD (chronic obstructive pulmonary disease) ([place])    Acute respiratory failure with hypoxia ([place]) 08/10/2018   CKD (chronic kidney disease) stage 3, GFR 30-59 ml/min (HCC) 08/10/2018   Recurrent epistaxis 04/21/2018   Mixed hyperlipidemia  07/28/2017   Essential hypertension 31/49/7026   Chronic systolic heart failure ([place]) 10/25/2014   Cocaine abuse ([place]) 02/20/2013   Cannabis abuse 02/20/2013   Back pain, chronic 02/20/2013    Conditions to be addressed/monitored per PCP order:  Anxiety and Depression  Care Plan : General Social Work (Adult)  Updates made by Greg Cutter, LCSW since 07/08/2022 12:00 AM     Problem: Depression Identification (Depression)      Long-Range Goal: To increase my self care and gain  mental health support   Start Date: 07/08/2022  Priority: High  Note:   Timeframe:  Long-Range Goal Priority:  High Start Date:   07/08/22                    Expected End Date:ongoing    Follow Up Date- 07/15/22 at 9 am   - begin personal counseling - call and visit an old friend - check out volunteer opportunities - join a support group - laugh; watch a funny movie or comedian - learn and use visualization or guided imagery - perform a random act of kindness - practice relaxation or meditation daily - start or continue a personal journal - talk about feelings with a friend, family or spiritual advisor - practice positive thinking and self-talk    Why is this important?   When you are stressed, down or upset, your body reacts too.  For example, your blood pressure may get higher; you may have a headache or stomachache.  When your emotions get the best of you, your body's ability to fight off cold and flu gets weak.  These steps will help you manage your emotions.   Current Barriers:  Chronic Mental Health needs related to depression and need for housing Limited social support, Housing barriers, and Mental Health Concerns  Social Isolation ADL IADL limitations Suicidal Ideation/Homicidal Ideation: No  Clinical Social Work Goal(s):  Over the next 120 days, patient will work with LCSW monthly by telephone or in person to reduce or manage symptoms related to depression and relapse prevention patient will work with [name] to address needs related to financial, food and transportation support. Patient wishes to offset medical and housing expenses with financial support as housing resources are very limited at this time  Interventions: Patient interviewed and appropriate assessments performed: brief mental health assessment Discussed plans with patient for ongoing care management follow up and provided patient with direct contact information for care management team Assisted  patient/caregiver with obtaining information about health plan benefits Patient was educated on the Advance Directives process. Patient was encouraged to complete document with PCP.  Advance Directive education was provided to patient again on 07/08/22. Encouraged patient to consider a mental health provider for long term follow up and therapy/counseling and patient is now agreeable. Referral made to [facility] for both counseling and psychiatry.  Patient reports recent agitation due to his inability to meet his daily mental and physical needs. [facility] LCSW provided emotional support and reflective listening.  Solution-Focused Strategies, Mindfulness or Relaxation Training, Active listening / Reflection utilized , Emotional Supportive Provided, and Verbalization of feelings encouraged  Patient declines any current substance use. Patient has a history of cocaine abuse. Relapse prevention education provided.  Patient reports that he has developed social anxiety. He admits that he gets irritable at times and will snap at his loved ones and then will experience guilt afterwards. Educated patient on coping methods to implement into his daily life to combat anxiety symptoms and  stress. Patient denied any current suicidal or homicidal ideations. Encouraged patient to implement deep breathing and grounding exercises into his daily routine due to ongoing anxiety and SOB.  Patient reports that he will start implementing appropriate self-care habits into his daily routine such as: drinking water, staying active around the house, taking his medications as prescribed, combating negative thoughts or emotions and staying connected with his support network. Positive reinforcement provided.  [facility] LCSW made referral for [facility] [name] and [facility] RNCM involvement. Message sent to entire team by [facility] LCSW. 09/15/21: [name] completed telephone outreach with patient for utility assistance. Patient stated that he has a cut off notice for 09/25/21 and his  bill is $700.00. [name] informed patient he can go to the [place] and can also [employer] [name] provided patient with the telephone number for [employer]. Patient stated no other resources are needed at this time.   Patient Self Care Activities:  Ability for insight Agreement to start mental health treatment and to the self-care journey   Patient Coping Strengths:  Self Advocate Able to Communicate Effectively  Patient Self Care Deficits:  Lacks social connections  The following coping skill education was provided for stress relief and mental health management: "When your car dies or a deadline looms, how do you respond? Long-term, low-grade or acute stress takes a serious toll on your body and mind, so don't ignore feelings of constant tension. Stress is a natural part of life. However, too much stress can harm our health, especially if it continues every day. This is chronic stress and can put you at risk for heart problems like heart disease and depression. Understand what's happening inside your body and learn simple coping skills to combat the negative impacts of everyday stressors.  Types of Stress There are two types of stress: Emotional - types of emotional stress are relationship problems, pressure at work, financial worries, experiencing discrimination or having a major life change. Physical - Examples of physical stress include being sick having pain, not sleeping well, recovery from an injury or having an alcohol and drug use disorder. Fight or Flight Sudden or ongoing stress activates your nervous system and floods your bloodstream with adrenaline and cortisol, two hormones that raise blood pressure, increase heart rate and spike blood sugar. These changes pitch your body into a fight or flight response. That enabled our ancestors to outrun saber-toothed tigers, and it's helpful today for situations like dodging a car accident. But most  modern chronic stressors, such as finances or a challenging relationship, keep your body in that heightened state, which hurts your health. Effects of Too Much Stress If constantly under stress, most of [place] will eventually start to function less well.  Multiple studies link chronic stress to a higher risk of heart disease, stroke, depression, weight gain, memory loss and even premature death, so it's important to recognize the warning signals. Talk to your doctor about ways to manage stress if you're experiencing any of these symptoms: Prolonged periods of poor sleep. Regular, severe headaches. Unexplained weight loss or gain. Feelings of isolation, withdrawal or worthlessness. Constant anger and irritability. Loss of interest in activities. Constant worrying or obsessive thinking. Excessive alcohol or drug use. Inability to concentrate.  10 Ways to Cope with Chronic Stress It's key to recognize stressful situations as they occur because it allows you to focus on managing how you react. We all need to know when to close our eyes and take a deep  breath when we feel tension rising. Use these tips to prevent or reduce chronic stress. 1. Rebalance Work and Home All work and no play? If you're spending too much time at the office, intentionally put more dates in your calendar to enjoy time for fun, either alone or with others. 2. Get Regular Exercise Moving your body on a regular basis balances the nervous system and increases blood circulation, helping to flush out stress hormones. Even a daily 20-minute walk makes a difference. Any kind of exercise can lower stress and improve your mood ? just pick activities that you enjoy and make it a regular habit. 3. Eat Well and Limit Alcohol and Stimulants Alcohol, nicotine and caffeine may temporarily relieve stress but have negative health impacts and can make stress worse in the long run. Well-nourished bodies cope better, so start with a good breakfast,  add more organic fruits and vegetables for a well-balanced diet, avoid processed foods and sugar, try herbal tea and drink more water. 4. Connect with Supportive People Talking face to face with another person releases hormones that reduce stress. Lean on those good listeners in your life. 5. [place] Time Do you enjoy gardening, reading, listening to music or some other creative pursuit? Engage in activities that bring you pleasure and joy; research shows that reduces stress by almost half and lowers your heart rate, too. 6. Practice Meditation, Stress Reduction or Yoga Relaxation techniques activate a state of restfulness that counterbalances your body's fight-or-flight hormones. Even if this also means a 10-minute break in a long day: listen to music, read, go for a walk in nature, do a hobby, take a bath or spend time with a friend. Also consider doing a mindfulness exercise or try a daily deep breathing or imagery practice. Deep Breathing Slow, calm and deep breathing can help you relax. Try these steps to focus on your breathing and repeat as needed. Find a comfortable position and close your eyes. Exhale and drop your shoulders. Breathe in through your nose; fill your lungs and then your belly. Think of relaxing your body, quieting your mind and becoming calm and peaceful. Breathe out slowly through your nose, relaxing your belly. Think of releasing tension, pain, worries or distress. Repeat steps three and four until you feel relaxed. Imagery This involves using your mind to excite the senses -- sound, vision, smell, taste and feeling. This may help ease your stress. Begin by getting comfortable and then do some slow breathing. Imagine a place you love being at. It could be somewhere from your childhood, somewhere you vacationed or just a place in your imagination. Feel how it is to be in the place you're imagining. Pay attention to the sounds, air, colors, and who is there with you.  This is a place where you feel cared for and loved. All is well. You are safe. Take in all the smells, sounds, tastes and feelings. As you do, feel your body being nourished and healed. Feel the calm that surrounds you. Breathe in all the good. Breathe out any discomfort or tension. 7. Sleep Enough If you get less than seven to eight hours of sleep, your body won't tolerate stress as well as it could. If stress keeps you up at night, address the cause, and add extra meditation into your day to make up for the lost z's. Try to get seven to nine hours of sleep each night. Make a regular bedtime schedule. Keep your room dark and cool. Try to  avoid computers, TV, cell phones and tablets before bed. 8. Bond with Connections You Enjoy Go out for a coffee with a friend, chat with a neighbor, call a family member, visit with a clergy member, or even hang out with your pet. Clinical studies show that spending even a short time with a companion animal can cut anxiety levels almost in half. 9. Take a Vacation Getting away from it all can reset your stress tolerance by increasing your mental and emotional outlook, which makes you a happier, more productive person upon return. Leave your cellphone and laptop at home! 10. See a Counselor, Coach or Therapist If negative thoughts overwhelm your ability to make positive changes, it's time to seek professional help. Make an appointment today--your health and life are worth it."     24- Hour Availability:    [facility]  [name] [place], [place] [facility] [phone]   [facility] [employer] [phone]   [place]  [phone]    [place]  [phone] (after hours)   Therapeutic Alternative/Mobile Crisis   [phone]   [place] National Suicide Hotline  [phone] Diamantina Monks) [place] 988   Call 911 or go to emergency room   [facility]   [phone]);  [place] and [employer]  [phone]); Lilla Shook, [place], [place], [place], [place], [place]            07/08/2022   10:52 AM 05/11/2022    4:17 PM 12/21/2021   11:44 AM 08/20/2021    9:57 AM 05/14/2021   12:36 PM  Depression screen PHQ 2/9  Decreased Interest '2 1 2 '$ 0 0  Down, Depressed, Hopeless '2 1 1 '$ 0 1  PHQ - 2 Score '4 2 3 '$ 0 1  Altered sleeping '3 3 3  3  '$ Tired, decreased energy '3 3 2  1  '$ Change in appetite 2 2 0  0  Feeling bad or failure about yourself  2 2 0  0  Trouble concentrating '2 2 1  '$ 0  Moving slowly or fidgety/restless 0 0 1  0  Suicidal thoughts 0 0 0  0  PHQ-9 Score '16 14 10  5  '$ Difficult doing work/chores Very difficult      New Goal      Follow up:  Patient agrees to Care Plan and Follow-up.  Plan: The Managed Medicaid care management team will reach out to the patient again over the next 30 days.  Date/time of next scheduled Social Work care management/care coordination outreach:  07/15/22 at 9 am  Eula Fried, [name], MSW, [place] Medicaid LCSW [email or web address] Phone: [phone]

## 2022-07-08 NOTE — Patient Instructions (Signed)
Visit Information  Mr. Blitch was given information about Medicaid Managed Care team care coordination services as a part of their Healthy [facility] Medicaid benefit. Chris [name] verbally consented to engagement with the [facility] Managed Care team.   If you are experiencing a medical emergency, please call 911 or report to your local emergency department or urgent care.   If you have a non-emergency medical problem during routine business hours, please contact your provider's office and ask to speak with a nurse.   For questions related to your Healthy [facility] health plan, please call: [phone] or visit the homepage here: [email or web address]  If you would like to schedule transportation through your Healthy [facility] plan, please call the following number at least 2 days in advance of your appointment: [phone]  For information about your ride after you set it up, call Ride Assist at [phone]. Use this number to activate a Will Call pickup, or if your transportation is late for a scheduled pickup. Use this number, too, if you need to make a change or cancel a previously scheduled reservation.  If you need transportation services right away, call [phone]. The after-hours call center is staffed 24 hours to handle ride assistance and urgent reservation requests (including discharges) 365 days a year. Urgent trips include sick visits, hospital discharge requests and life-sustaining treatment.  Call the [facility] at [phone], at any time, 24 hours a day, 7 days a week. If you are in danger or need immediate medical attention call 911.  If you would like help to quit smoking, call 1-800-QUIT-NOW 401 734 4427) OR Espaol: 1-855-Djelo-Ya (9-169-450-3888) o para ms informacin haga clic aqu or Text READY to 200-400 to register via text   Following is a copy of your plan of care:  Care Plan : General Social Work  (Adult)  Updates made by Greg Cutter, LCSW since 07/08/2022 12:00 AM     Problem: Depression Identification (Depression)      Long-Range Goal: To increase my self care and gain mental health support   Start Date: 07/08/2022  Priority: High  Note:   Timeframe:  Long-Range Goal Priority:  High Start Date:   07/08/22                    Expected End Date:ongoing    Follow Up Date- 07/15/22 at 9 am   - begin personal counseling - call and visit an old friend - check out volunteer opportunities - join a support group - laugh; watch a funny movie or comedian - learn and use visualization or guided imagery - perform a random act of kindness - practice relaxation or meditation daily - start or continue a personal journal - talk about feelings with a friend, family or spiritual advisor - practice positive thinking and self-talk    Why is this important?   When you are stressed, down or upset, your body reacts too.  For example, your blood pressure may get higher; you may have a headache or stomachache.  When your emotions get the best of you, your body's ability to fight off cold and flu gets weak.  These steps will help you manage your emotions.   Current Barriers:  Chronic Mental Health needs related to depression and need for housing Limited social support, Housing barriers, and Mental Health Concerns  Social Isolation ADL IADL limitations Suicidal Ideation/Homicidal Ideation: No  Clinical Social Work Goal(s):  Over the next 120 days, patient will work with [employer] monthly  by telephone or in person to reduce or manage symptoms related to depression and relapse prevention patient will work with [name] to address needs related to financial, food and transportation support. Patient wishes to offset medical and housing expenses with financial support as housing resources are very limited at this time  Patient Self Care Activities:  Ability for insight Agreement to start mental health  treatment and to the self-care journey   Patient Coping Strengths:  Self Advocate Able to Communicate Effectively  Patient Self Care Deficits:  Lacks social connections  The following coping skill education was provided for stress relief and mental health management: "When your car dies or a deadline looms, how do you respond? Long-term, low-grade or acute stress takes a serious toll on your body and mind, so don't ignore feelings of constant tension. Stress is a natural part of life. However, too much stress can harm our health, especially if it continues every day. This is chronic stress and can put you at risk for heart problems like heart disease and depression. Understand what's happening inside your body and learn simple coping skills to combat the negative impacts of everyday stressors.  Types of Stress There are two types of stress: Emotional - types of emotional stress are relationship problems, pressure at work, financial worries, experiencing discrimination or having a major life change. Physical - Examples of physical stress include being sick having pain, not sleeping well, recovery from an injury or having an alcohol and drug use disorder. Fight or Flight Sudden or ongoing stress activates your nervous system and floods your bloodstream with adrenaline and cortisol, two hormones that raise blood pressure, increase heart rate and spike blood sugar. These changes pitch your body into a fight or flight response. That enabled our ancestors to outrun saber-toothed tigers, and it's helpful today for situations like dodging a car accident. But most modern chronic stressors, such as finances or a challenging relationship, keep your body in that heightened state, which hurts your health. Effects of Too Much Stress If constantly under stress, most of [place] will eventually start to function less well.  Multiple studies link chronic stress to a higher risk of heart disease, stroke, depression,  weight gain, memory loss and even premature death, so it's important to recognize the warning signals. Talk to your doctor about ways to manage stress if you're experiencing any of these symptoms: Prolonged periods of poor sleep. Regular, severe headaches. Unexplained weight loss or gain. Feelings of isolation, withdrawal or worthlessness. Constant anger and irritability. Loss of interest in activities. Constant worrying or obsessive thinking. Excessive alcohol or drug use. Inability to concentrate.  10 Ways to Cope with Chronic Stress It's key to recognize stressful situations as they occur because it allows you to focus on managing how you react. We all need to know when to close our eyes and take a deep breath when we feel tension rising. Use these tips to prevent or reduce chronic stress. 1. Rebalance Work and Home All work and no play? If you're spending too much time at the office, intentionally put more dates in your calendar to enjoy time for fun, either alone or with others. 2. Get Regular Exercise Moving your body on a regular basis balances the nervous system and increases blood circulation, helping to flush out stress hormones. Even a daily 20-minute walk makes a difference. Any kind of exercise can lower stress and improve your mood ? just pick activities that you enjoy and make it a regular habit.  3. Eat Well and Limit Alcohol and Stimulants Alcohol, nicotine and caffeine may temporarily relieve stress but have negative health impacts and can make stress worse in the long run. Well-nourished bodies cope better, so start with a good breakfast, add more organic fruits and vegetables for a well-balanced diet, avoid processed foods and sugar, try herbal tea and drink more water. 4. Connect with Supportive People Talking face to face with another person releases hormones that reduce stress. Lean on those good listeners in your life. 5. [place] Time Do you enjoy gardening,  reading, listening to music or some other creative pursuit? Engage in activities that bring you pleasure and joy; research shows that reduces stress by almost half and lowers your heart rate, too. 6. Practice Meditation, Stress Reduction or Yoga Relaxation techniques activate a state of restfulness that counterbalances your body's fight-or-flight hormones. Even if this also means a 10-minute break in a long day: listen to music, read, go for a walk in nature, do a hobby, take a bath or spend time with a friend. Also consider doing a mindfulness exercise or try a daily deep breathing or imagery practice. Deep Breathing Slow, calm and deep breathing can help you relax. Try these steps to focus on your breathing and repeat as needed. Find a comfortable position and close your eyes. Exhale and drop your shoulders. Breathe in through your nose; fill your lungs and then your belly. Think of relaxing your body, quieting your mind and becoming calm and peaceful. Breathe out slowly through your nose, relaxing your belly. Think of releasing tension, pain, worries or distress. Repeat steps three and four until you feel relaxed. Imagery This involves using your mind to excite the senses -- sound, vision, smell, taste and feeling. This may help ease your stress. Begin by getting comfortable and then do some slow breathing. Imagine a place you love being at. It could be somewhere from your childhood, somewhere you vacationed or just a place in your imagination. Feel how it is to be in the place you're imagining. Pay attention to the sounds, air, colors, and who is there with you. This is a place where you feel cared for and loved. All is well. You are safe. Take in all the smells, sounds, tastes and feelings. As you do, feel your body being nourished and healed. Feel the calm that surrounds you. Breathe in all the good. Breathe out any discomfort or tension. 7. Sleep Enough If you get less than seven to eight  hours of sleep, your body won't tolerate stress as well as it could. If stress keeps you up at night, address the cause, and add extra meditation into your day to make up for the lost z's. Try to get seven to nine hours of sleep each night. Make a regular bedtime schedule. Keep your room dark and cool. Try to avoid computers, TV, cell phones and tablets before bed. 8. Bond with Connections You Enjoy Go out for a coffee with a friend, chat with a neighbor, call a family member, visit with a clergy member, or even hang out with your pet. Clinical studies show that spending even a short time with a companion animal can cut anxiety levels almost in half. 9. Take a Vacation Getting away from it all can reset your stress tolerance by increasing your mental and emotional outlook, which makes you a happier, more productive person upon return. Leave your cellphone and laptop at home! 10. See a Social worker, Civil Service fast streamer  If negative thoughts overwhelm your ability to make positive changes, it's time to seek professional help. Make an appointment today--your health and life are worth it."     24- Hour Availability:    [facility]  [address] [place], [place] [facility] [phone]   [facility] [employer] [phone]   [place]  [phone]    [place]  [phone] (after hours)   [facility]/Mobile Crisis   [phone]   [place] National Suicide Hotline  847 010 7434 [address]ina Monks) [place] 988   Call 911 or go to emergency room   [facility]  [phone]);  [place] and [employer]  [phone]); [place], [place], [place], [place], [place], [place], [place]

## 2022-07-09 ENCOUNTER — Inpatient Hospital Stay (HOSPITAL_COMMUNITY)
Admission: RE | Admit: 2022-07-09 | Discharge: 2022-07-09 | Disposition: A | Discharge status: Home / Self Care | Payer: Medicaid Other | Source: Ambulatory Visit

## 2022-07-09 ENCOUNTER — Encounter: Payer: Self-pay | Admitting: Cardiology

## 2022-07-09 ENCOUNTER — Other Ambulatory Visit: Payer: Self-pay | Admitting: *Deleted

## 2022-07-09 ENCOUNTER — Other Ambulatory Visit: Payer: Self-pay | Admitting: Cardiology

## 2022-07-09 ENCOUNTER — Other Ambulatory Visit: Payer: Self-pay

## 2022-07-09 ENCOUNTER — Other Ambulatory Visit (HOSPITAL_COMMUNITY): Payer: Self-pay | Admitting: Cardiology

## 2022-07-09 ENCOUNTER — Other Ambulatory Visit: Payer: Medicaid Other

## 2022-07-09 ENCOUNTER — Ambulatory Visit (HOSPITAL_BASED_OUTPATIENT_CLINIC_OR_DEPARTMENT_OTHER): Payer: Medicaid Other | Admitting: Cardiology

## 2022-07-09 ENCOUNTER — Other Ambulatory Visit
Admission: RE | Admit: 2022-07-09 | Discharge: 2022-07-09 | Disposition: A | Discharge status: Home / Self Care | Payer: Medicaid Other | Source: Ambulatory Visit | Attending: Cardiology | Admitting: Cardiology

## 2022-07-09 VITALS — BP 138/80 | HR 79 | Wt 256.4 lb

## 2022-07-09 DIAGNOSIS — R55 Syncope and collapse: Secondary | ICD-10-CM

## 2022-07-09 DIAGNOSIS — I5032 Chronic diastolic (congestive) heart failure: Secondary | ICD-10-CM | POA: Insufficient documentation

## 2022-07-09 DIAGNOSIS — I421 Obstructive hypertrophic cardiomyopathy: Secondary | ICD-10-CM | POA: Diagnosis not present

## 2022-07-09 DIAGNOSIS — Z01810 Encounter for preprocedural cardiovascular examination: Secondary | ICD-10-CM | POA: Diagnosis not present

## 2022-07-09 LAB — CBC
HCT: 43.6 % (ref 39.0–52.0)
Hemoglobin: 14 g/dL (ref 13.0–17.0)
MCH: 28.9 pg (ref 26.0–34.0)
MCHC: 32.1 g/dL (ref 30.0–36.0)
MCV: 90.1 fL (ref 80.0–100.0)
Platelets: 242 10*3/uL (ref 150–400)
RBC: 4.84 MIL/uL (ref 4.22–5.81)
RDW: 14.6 % (ref 11.5–15.5)
WBC: 10.2 10*3/uL (ref 4.0–10.5)
nRBC: 0 % (ref 0.0–0.2)

## 2022-07-09 LAB — BRAIN NATRIURETIC PEPTIDE: B Natriuretic Peptide: 732.9 pg/mL — ABNORMAL HIGH (ref 0.0–100.0)

## 2022-07-09 LAB — BASIC METABOLIC PANEL
Anion gap: 9 (ref 5–15)
BUN: 19 mg/dL (ref 6–20)
CO2: 26 mmol/L (ref 22–32)
Calcium: 9.1 mg/dL (ref 8.9–10.3)
Chloride: 103 mmol/L (ref 98–111)
Creatinine, Ser: 1.86 mg/dL — ABNORMAL HIGH (ref 0.61–1.24)
GFR, Estimated: 41 mL/min — ABNORMAL LOW (ref >60)
Glucose, Bld: 95 mg/dL (ref 70–99)
Potassium: 4.1 mmol/L (ref 3.5–5.1)
Sodium: 138 mmol/L (ref 135–145)

## 2022-07-09 MED ORDER — TORSEMIDE 20 MG PO TABS
40.0000 mg | ORAL_TABLET | ORAL | 3 refills | Status: DC
  Filled 2022-07-09: qty 45, 30d supply, fill #0

## 2022-07-09 NOTE — Progress Notes (Unsigned)
REDS VEST READING= 37  CHEST RULER=40  VEST FITTING TASKS: POSTURE=sitting   HEIGHT MARKER=D CENTER STRIP=aligned

## 2022-07-09 NOTE — Patient Instructions (Signed)
Medication Changes:  STOP Bidil  CHANGE Torsemide to 40 mg (2 tabs) Daily every other day ALTERNATING with 20 mg (1 tab) Daily every other day   Lab Work:  Labs done today, your results will be available in MyChart, we will contact you for abnormal readings.  Testing/Procedures:  Heart Catheterization on Tue 07/13/22, see instructions below  Your provider has recommended that  you wear a Zio Patch for 14 days.  This monitor will record your heart rhythm for our review.  IF you have any symptoms while wearing the monitor please press the button.  If you have any issues with the patch or you notice a red or orange light on it please call the company at [phone].  Once you remove the patch please mail it back to the company as soon as possible so we can get the results.  Genetic test has been done, this has to be sent to [place] to be processed and can take 1-2 weeks to get results back.  We will let you know the results.  Referrals:  none  Special Instructions // Education:  Do the following things EVERYDAY: Weigh yourself in the morning before breakfast. Write it down and keep it in a log. Take your medicines as prescribed Eat low salt foods--Limit salt (sodium) to 2000 mg per day.  Stay as active as you can everyday Limit all fluids for the day to less than 2 liters   Follow-Up in: 1 month    If you have any questions or concerns before your next appointment please send [place] a message through mychart or call our office at [phone] Monday-Friday 8 am-5 pm.   If you have an urgent need after hours on the weekend please call your Primary Cardiologist or the [facility] in [place] at [phone].    CATHETERIZATION INSTRUCTIONS:  You are scheduled for a Cardiac Catheterization on Tuesday, January 30 with Dr. Loralie Champagne.  1. Please arrive at the [facility] (Main Entrance A) at [facility]: [address] [place], [place] [address]  at 11:30 AM (This time is two hours before your procedure to ensure your preparation). Free valet parking service is available.   Special note: Every effort is made to have your procedure done on time. Please understand that emergencies sometimes delay scheduled procedures.  2. Diet: Do not eat solid foods after midnight.  The patient may have clear liquids until 5am upon the day of the procedure.  3. Labs: DONE TODAY  4. Medication instructions in preparation for your procedure:   TUESDAY 1/30 AM DO NOT TAKE TORSEMIDE OR FARXIGA  On the morning of your procedure, take your Aspirin 81 mg and any morning medicines NOT listed above.  You may use sips of water.  5. Plan for one night stay--bring personal belongings. 6. Bring a current list of your medications and current insurance cards. 7. You MUST have a responsible person to drive you home. 8. Someone MUST be with you the first 24 hours after you arrive home or your discharge will be delayed. 9. Please wear clothes that are easy to get on and off and wear slip-on shoes.  Thank you for allowing [place] to care for you!   -- [facility]

## 2022-07-09 NOTE — Progress Notes (Unsigned)
Zio patch placed onto patient.  All instructions and information reviewed with patient, they verbalize understanding with no questions. 

## 2022-07-09 NOTE — Progress Notes (Unsigned)
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  Order form completed and both shipped by [employer] to [facility].

## 2022-07-11 NOTE — Progress Notes (Signed)
PCP: [name], MD HF Cardiology: Dr. Aundra Dubin  62 y.o. 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  Patient was admitted in 1/22 with CHF and found to be in atrial flutter.  Echo showed EF 25% and he was cardioverted to NSR.  He is no longer anticoagulated. Cardiolite in 2/23 showed no ischemia, fixed inferior defect.  Echo in 8/23 showed EF 50% with severe asymmetric septal hypertrophy but no SAM or LVOT gradient.  Cardiac MRI in 12/23 was similar with LV EF 50%, severe asymmetric septal hypertrophy, normal RV, LGE in the basal septum. These studies were concerning for hypertrophic cardiomyopathy.  Patient is an active smoker and used to use cocaine.  He carries history of COPD.    Patient has significant exertional dyspnea, he is short of breath just walking around the house.  No oerthopnea/PND.  No chest pain.  He gets lightheaded with standing and recently cut his Bidil back to 1 tab tid.  SBP generally runs around 110 when he checks at home.  He has had 2 episodes of syncope recently.  1 episode occurred while walking, he passed out an fell to the ground with incontinence.  The second episode occurred when he stood up after getting out of bed one morning and passed out, again with incontinence. Patient is limited by joint pain (knees, ankles) and does not walk much (came in with wheelchair today).    He is not very sure of family history, says father and brother had "heart problems."    ECG (personally reviewed): NSR, PVC, LAFB, LVH with repolarization  Labs (1/24): BNP 873, K 3.6, creatinine 1.7  REDS clip: 37%  PMH: 1. Renal cell carcinoma on right: s/p ablation 1/24.  2. Systolic => diastolic CHF: EF low in the past, echo in 1/22 with EF 22%.   - Cardiolite (2/23): Fixed inferior defect, ?artifact.  - Echo (8/23): EF 50%, severe asymmetric  septal hypertrophy, no SAM/MR, no LVOT gradient, RV normal.  - Cardiac MRI (12/23): LV EF 50%, severe asymmetric septal hypertrophy, normal RV, LGE in the basal septum.  Possible hypertrophic cardiomyopathy.  3. Atrial flutter: DCCV 1/22.  He is not anticoagulated.  4. Type 2 diabetes.  5. HTN 6. CKD stage 3 7. Gout 8. Depression 9. COPD: Active smoker.  10. Prior cocaine abuse.   Social History   Socioeconomic History   Marital status: Divorced    Spouse name: Not on file   Number of children: 3   Years of education: Not on file   Highest education level: High school graduate  Occupational History   Occupation: disability  Tobacco Use   Smoking status: Every Day    Packs/day: 1.00    Years: 43.00    Total pack years: 43.00    Types: Cigarettes   Smokeless tobacco: Never   Tobacco comments:        4 cigs daily--05/12/2022  Vaping Use   Vaping Use: Never used  Substance and Sexual Activity   Alcohol use: No   Drug use: Yes    Frequency: 21.0 times per week    Types: Marijuana, Cocaine    Comment: last use Cocaine- 03/28/2021. Still using marijuana, last use 06/22/21   Sexual activity: Not on file  Other Topics Concern   Not on file  Social History Narrative   ** Merged History Encounter **  Social Determinants of Health   Financial Resource Strain: Medium Risk (06/19/2021)   Overall Financial Resource Strain (CARDIA)    Difficulty of Paying Living Expenses: Somewhat hard  Food Insecurity: No Food Insecurity (04/20/2022)   Hunger Vital Sign    Worried About Running Out of Food in the Last Year: Never true    Ran Out of Food in the Last Year: Never true  Transportation Needs: No Transportation Needs (04/23/2022)   PRAPARE - Hydrologist (Medical): No    Lack of Transportation (Non-Medical): No  Recent Concern: Transportation Needs - Unmet Transportation Needs (04/20/2022)   PRAPARE - Transportation    Lack of Transportation  (Medical): Yes    Lack of Transportation (Non-Medical): Yes  Physical Activity: Insufficiently Active (11/16/2021)   Exercise Vital Sign    Days of Exercise per Week: 5 days    Minutes of Exercise per Session: 20 min  Stress: Stress Concern Present (07/08/2022)   [place]    Feeling of Stress : Rather much  Social Connections: Moderately Isolated (04/28/2021)   Social Connection and Isolation Panel [NHANES]    Frequency of Communication with Friends and Family: More than three times a week    Frequency of Social Gatherings with Friends and Family: Once a week    Attends Religious Services: More than 4 times per year    Active Member of [employer] or Organizations: No    Attends Archivist Meetings: Never    Marital Status: Divorced  Human resources officer Violence: Not At Risk (04/20/2022)   Humiliation, Afraid, Rape, and Kick questionnaire    Fear of Current or Ex-Partner: No    Emotionally Abused: No    Physically Abused: No    Sexually Abused: No   Family History  Problem Relation Age of Onset   Heart disease Father    Diabetes Mother    HIV Brother    Healthy Son    Healthy Daughter    ROS: All systems reviewed and negative except as per HPI.   Current Outpatient Medications  Medication Sig Dispense Refill   Accu-Chek Softclix Lancets lancets Use as instructed 100 each 12   albuterol (PROVENTIL) (2.5 MG/3ML) 0.083% nebulizer solution Take 3 mLs (2.5 mg total) by nebulization every 4 (four) hours as needed for wheezing or shortness of breath. 300 mL 0   albuterol (VENTOLIN HFA) 108 (90 Base) MCG/ACT inhaler Inhale 2 puffs into the lungs every 6 (six) hours as needed for wheezing or shortness of breath. 6.7 g 0   allopurinol (ZYLOPRIM) 100 MG tablet TAKE 2 TABLETS (200 MG TOTAL) BY MOUTH DAILY. 180 tablet 0   aspirin EC 81 MG tablet Take 1 tablet (81 mg total) by mouth daily. 30 tablet 0   atorvastatin (LIPITOR)  40 MG tablet Take 1 tablet (40 mg total) by mouth daily. 90 tablet 3   Blood Glucose Monitoring Suppl (ACCU-CHEK GUIDE) w/Device KIT Use as directed 1 kit 0   budesonide-formoterol (SYMBICORT) 160-4.5 MCG/ACT inhaler Inhale 2 puffs into the lungs 2 (two) times daily. 10.2 g 0   carvedilol (COREG) 6.25 MG tablet Take 1 tablet (6.25 mg total) by mouth 2 (two) times daily. 180 tablet 3   dapagliflozin propanediol (FARXIGA) 10 MG TABS tablet Take 1 tablet (10 mg total) by mouth daily. 90 tablet 3   DULoxetine (CYMBALTA) 20 MG capsule Take 1 capsule (20 mg total) by mouth daily. 30 capsule 3  glucose blood (ACCU-CHEK GUIDE) test strip Use as directed to check blood sugar 1-2 times a day 100 each 12   montelukast (SINGULAIR) 10 MG tablet TAKE 1 TABLET (10 MG TOTAL) BY MOUTH AT BEDTIME. 30 tablet 0   oxyCODONE-acetaminophen (PERCOCET) 5-325 MG tablet Take 1-2 tablets by mouth every 6 (six) hours as needed for severe pain. 20 tablet 0   Tiotropium Bromide Monohydrate (SPIRIVA RESPIMAT) 2.5 MCG/ACT AERS Inhale 2 puffs into the lungs daily. 4 g 0   methylPREDNISolone (MEDROL DOSEPAK) 4 MG TBPK tablet Use as directed 21 tablet 0   torsemide (DEMADEX) 20 MG tablet Take 2 tablets (40 mg total) by mouth every other day ALTERNATING with 1 tablet every other day 45 tablet 3   No current facility-administered medications for this visit.   General: NAD Neck: Thick, JVP difficult, no thyromegaly or thyroid nodule.  Lungs: Clear to auscultation bilaterally with normal respiratory effort. CV: Nondisplaced PMI.  Heart regular S1/S2, no S3/S4, 2/6 early SEM RUSB.  No peripheral edema.  No carotid bruit.  Normal pedal pulses.  Abdomen: Soft, nontender, no hepatosplenomegaly, no distention.  Skin: Intact without lesions or rashes.  Neurologic: Alert and oriented x 3.  Psych: Normal affect. Extremities: No clubbing or cyanosis.  HEENT: Normal.   1. Chronic diastolic CHF/?hypertrophic cardiomyopathy: Prior history  of HFrEF that may have been tachycardia-mediated in setting of atrial flutter in 1/22, now EF has recovered. Cardiolite in 2/23 showed no ischemia, fixed inferior defect.  Echo in 8/23 showed EF 50% with severe asymmetric septal hypertrophy but no SAM or LVOT gradient.  Cardiac MRI in 12/23 was similar with LV EF 50%, severe asymmetric septal hypertrophy, normal RV, LGE in the basal septum. These studies were concerning for hypertrophic cardiomyopathy.  Family history somewhat nebulous, father and brother with "heart problems."  Syncopal episodes may have been orthostatic based on chronic lightheadedness with standing, but I worry about ventricular arrhythmias given possible HCM and LGE noted on cardiac MRI. Exam difficult for volume but REDS clip 37% and he has NYHA class III symptoms.  - I am going to send off for [employer] gene testing to look for HCM mutations.  - I will arrange for Zio AT monitor x 2 wks to look for ventricular arrhythmias due to recent syncope. If he has syncope again, should go to the hospital.  - He will likely need EP referral to consider ICD, would like to get Zio results first.  - Hard to tell how much of dyspnea is related to CHF vs COPD, but with elevated REDS reading will increase torsemide to 40 daily alternating with 20 daily.  BMET/BNP today and BMET in 10 days.  - Given difficulty with volume assessment and significant dyspnea, I will arrange for RHC to assess filling pressures.  We discussed risks/benefits and he agrees to procedure.  - With orthostatic symptoms and possible HCM, will stop Bidil.  This is not an ideal med with HCM though LVOT gradient was not visualized on last echo.  - Continue Coreg 6.25 mg bid.  - Continue dapagliflozin 10 mg daily.  2. CKD stage 3: BMET today.  3. COPD: Patient is active smoker and carries this history.  I strongly encouraged him to quit smoking today.  He is going to try nicotine patches.  4. Atrial flutter: S/p DCCV in 1/22.  I am  unclear about why he is no longer anticoagulated and he does not know.  He is getting Zio monitor (see above), will  readdress anticoagulation at next appointment.   Followup in 1 month.   Loralie Champagne 07/11/2022

## 2022-07-11 NOTE — H&P (View-Only) (Signed)
PCP: [name], MD HF Cardiology: Dr. Aundra Dubin  62 y.o. 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  Patient was admitted in 1/22 with CHF and found to be in atrial flutter.  Echo showed EF 25% and he was cardioverted to NSR.  He is no longer anticoagulated. Cardiolite in 2/23 showed no ischemia, fixed inferior defect.  Echo in 8/23 showed EF 50% with severe asymmetric septal hypertrophy but no SAM or LVOT gradient.  Cardiac MRI in 12/23 was similar with LV EF 50%, severe asymmetric septal hypertrophy, normal RV, LGE in the basal septum. These studies were concerning for hypertrophic cardiomyopathy.  Patient is an active smoker and used to use cocaine.  He carries history of COPD.    Patient has significant exertional dyspnea, he is short of breath just walking around the house.  No oerthopnea/PND.  No chest pain.  He gets lightheaded with standing and recently cut his Bidil back to 1 tab tid.  SBP generally runs around 110 when he checks at home.  He has had 2 episodes of syncope recently.  1 episode occurred while walking, he passed out an fell to the ground with incontinence.  The second episode occurred when he stood up after getting out of bed one morning and passed out, again with incontinence. Patient is limited by joint pain (knees, ankles) and does not walk much (came in with wheelchair today).    He is not very sure of family history, says father and brother had "heart problems."    ECG (personally reviewed): NSR, PVC, LAFB, LVH with repolarization  Labs (1/24): BNP 873, K 3.6, creatinine 1.7  REDS clip: 37%  PMH: 1. Renal cell carcinoma on right: s/p ablation 1/24.  2. Systolic => diastolic CHF: EF low in the past, echo in 1/22 with EF 22%.   - Cardiolite (2/23): Fixed inferior defect, ?artifact.  - Echo (8/23): EF 50%, severe asymmetric  septal hypertrophy, no SAM/MR, no LVOT gradient, RV normal.  - Cardiac MRI (12/23): LV EF 50%, severe asymmetric septal hypertrophy, normal RV, LGE in the basal septum.  Possible hypertrophic cardiomyopathy.  3. Atrial flutter: DCCV 1/22.  He is not anticoagulated.  4. Type 2 diabetes.  5. HTN 6. CKD stage 3 7. Gout 8. Depression 9. COPD: Active smoker.  10. Prior cocaine abuse.   Social History   Socioeconomic History   Marital status: Divorced    Spouse name: Not on file   Number of children: 3   Years of education: Not on file   Highest education level: High school graduate  Occupational History   Occupation: disability  Tobacco Use   Smoking status: Every Day    Packs/day: 1.00    Years: 43.00    Total pack years: 43.00    Types: Cigarettes   Smokeless tobacco: Never   Tobacco comments:        4 cigs daily--05/12/2022  Vaping Use   Vaping Use: Never used  Substance and Sexual Activity   Alcohol use: No   Drug use: Yes    Frequency: 21.0 times per week    Types: Marijuana, Cocaine    Comment: last use Cocaine- 03/28/2021. Still using marijuana, last use 06/22/21   Sexual activity: Not on file  Other Topics Concern   Not on file  Social History Narrative   ** Merged History Encounter **  Social Determinants of Health   Financial Resource Strain: Medium Risk (06/19/2021)   Overall Financial Resource Strain (CARDIA)    Difficulty of Paying Living Expenses: Somewhat hard  Food Insecurity: No Food Insecurity (04/20/2022)   Hunger Vital Sign    Worried About Running Out of Food in the Last Year: Never true    Ran Out of Food in the Last Year: Never true  Transportation Needs: No Transportation Needs (04/23/2022)   PRAPARE - Hydrologist (Medical): No    Lack of Transportation (Non-Medical): No  Recent Concern: Transportation Needs - Unmet Transportation Needs (04/20/2022)   PRAPARE - Transportation    Lack of Transportation  (Medical): Yes    Lack of Transportation (Non-Medical): Yes  Physical Activity: Insufficiently Active (11/16/2021)   Exercise Vital Sign    Days of Exercise per Week: 5 days    Minutes of Exercise per Session: 20 min  Stress: Stress Concern Present (07/08/2022)   [place]    Feeling of Stress : Rather much  Social Connections: Moderately Isolated (04/28/2021)   Social Connection and Isolation Panel [NHANES]    Frequency of Communication with Friends and Family: More than three times a week    Frequency of Social Gatherings with Friends and Family: Once a week    Attends Religious Services: More than 4 times per year    Active Member of [employer] or Organizations: No    Attends Archivist Meetings: Never    Marital Status: Divorced  Human resources officer Violence: Not At Risk (04/20/2022)   Humiliation, Afraid, Rape, and Kick questionnaire    Fear of Current or Ex-Partner: No    Emotionally Abused: No    Physically Abused: No    Sexually Abused: No   Family History  Problem Relation Age of Onset   Heart disease Father    Diabetes Mother    HIV Brother    Healthy Son    Healthy Daughter    ROS: All systems reviewed and negative except as per HPI.   Current Outpatient Medications  Medication Sig Dispense Refill   Accu-Chek Softclix Lancets lancets Use as instructed 100 each 12   albuterol (PROVENTIL) (2.5 MG/3ML) 0.083% nebulizer solution Take 3 mLs (2.5 mg total) by nebulization every 4 (four) hours as needed for wheezing or shortness of breath. 300 mL 0   albuterol (VENTOLIN HFA) 108 (90 Base) MCG/ACT inhaler Inhale 2 puffs into the lungs every 6 (six) hours as needed for wheezing or shortness of breath. 6.7 g 0   allopurinol (ZYLOPRIM) 100 MG tablet TAKE 2 TABLETS (200 MG TOTAL) BY MOUTH DAILY. 180 tablet 0   aspirin EC 81 MG tablet Take 1 tablet (81 mg total) by mouth daily. 30 tablet 0   atorvastatin (LIPITOR)  40 MG tablet Take 1 tablet (40 mg total) by mouth daily. 90 tablet 3   Blood Glucose Monitoring Suppl (ACCU-CHEK GUIDE) w/Device KIT Use as directed 1 kit 0   budesonide-formoterol (SYMBICORT) 160-4.5 MCG/ACT inhaler Inhale 2 puffs into the lungs 2 (two) times daily. 10.2 g 0   carvedilol (COREG) 6.25 MG tablet Take 1 tablet (6.25 mg total) by mouth 2 (two) times daily. 180 tablet 3   dapagliflozin propanediol (FARXIGA) 10 MG TABS tablet Take 1 tablet (10 mg total) by mouth daily. 90 tablet 3   DULoxetine (CYMBALTA) 20 MG capsule Take 1 capsule (20 mg total) by mouth daily. 30 capsule 3  glucose blood (ACCU-CHEK GUIDE) test strip Use as directed to check blood sugar 1-2 times a day 100 each 12   montelukast (SINGULAIR) 10 MG tablet TAKE 1 TABLET (10 MG TOTAL) BY MOUTH AT BEDTIME. 30 tablet 0   oxyCODONE-acetaminophen (PERCOCET) 5-325 MG tablet Take 1-2 tablets by mouth every 6 (six) hours as needed for severe pain. 20 tablet 0   Tiotropium Bromide Monohydrate (SPIRIVA RESPIMAT) 2.5 MCG/ACT AERS Inhale 2 puffs into the lungs daily. 4 g 0   methylPREDNISolone (MEDROL DOSEPAK) 4 MG TBPK tablet Use as directed 21 tablet 0   torsemide (DEMADEX) 20 MG tablet Take 2 tablets (40 mg total) by mouth every other day ALTERNATING with 1 tablet every other day 45 tablet 3   No current facility-administered medications for this visit.   General: NAD Neck: Thick, JVP difficult, no thyromegaly or thyroid nodule.  Lungs: Clear to auscultation bilaterally with normal respiratory effort. CV: Nondisplaced PMI.  Heart regular S1/S2, no S3/S4, 2/6 early SEM RUSB.  No peripheral edema.  No carotid bruit.  Normal pedal pulses.  Abdomen: Soft, nontender, no hepatosplenomegaly, no distention.  Skin: Intact without lesions or rashes.  Neurologic: Alert and oriented x 3.  Psych: Normal affect. Extremities: No clubbing or cyanosis.  HEENT: Normal.   1. Chronic diastolic CHF/?hypertrophic cardiomyopathy: Prior history  of HFrEF that may have been tachycardia-mediated in setting of atrial flutter in 1/22, now EF has recovered. Cardiolite in 2/23 showed no ischemia, fixed inferior defect.  Echo in 8/23 showed EF 50% with severe asymmetric septal hypertrophy but no SAM or LVOT gradient.  Cardiac MRI in 12/23 was similar with LV EF 50%, severe asymmetric septal hypertrophy, normal RV, LGE in the basal septum. These studies were concerning for hypertrophic cardiomyopathy.  Family history somewhat nebulous, father and brother with "heart problems."  Syncopal episodes may have been orthostatic based on chronic lightheadedness with standing, but I worry about ventricular arrhythmias given possible HCM and LGE noted on cardiac MRI. Exam difficult for volume but REDS clip 37% and he has NYHA class III symptoms.  - I am going to send off for [employer] gene testing to look for HCM mutations.  - I will arrange for Zio AT monitor x 2 wks to look for ventricular arrhythmias due to recent syncope. If he has syncope again, should go to the hospital.  - He will likely need EP referral to consider ICD, would like to get Zio results first.  - Hard to tell how much of dyspnea is related to CHF vs COPD, but with elevated REDS reading will increase torsemide to 40 daily alternating with 20 daily.  BMET/BNP today and BMET in 10 days.  - Given difficulty with volume assessment and significant dyspnea, I will arrange for RHC to assess filling pressures.  We discussed risks/benefits and he agrees to procedure.  - With orthostatic symptoms and possible HCM, will stop Bidil.  This is not an ideal med with HCM though LVOT gradient was not visualized on last echo.  - Continue Coreg 6.25 mg bid.  - Continue dapagliflozin 10 mg daily.  2. CKD stage 3: BMET today.  3. COPD: Patient is active smoker and carries this history.  I strongly encouraged him to quit smoking today.  He is going to try nicotine patches.  4. Atrial flutter: S/p DCCV in 1/22.  I am  unclear about why he is no longer anticoagulated and he does not know.  He is getting Zio monitor (see above), will  readdress anticoagulation at next appointment.   Followup in 1 month.   Loralie Champagne 07/11/2022

## 2022-07-12 ENCOUNTER — Other Ambulatory Visit: Payer: Medicaid Other

## 2022-07-12 NOTE — Patient Outreach (Signed)
[name] contacted patient to scheduled appointment. Patient asked for a telephone call back later in the day.   [name], [name], [place] Managed Medicaid Team  [phone]

## 2022-07-13 ENCOUNTER — Encounter (HOSPITAL_COMMUNITY): Payer: Self-pay | Admitting: Cardiology

## 2022-07-13 ENCOUNTER — Other Ambulatory Visit: Payer: Self-pay

## 2022-07-13 ENCOUNTER — Encounter (HOSPITAL_COMMUNITY)
Admission: RE | Disposition: A | Discharge status: Home / Self Care | Payer: Self-pay | Source: Home / Self Care | Attending: Cardiology

## 2022-07-13 ENCOUNTER — Telehealth (HOSPITAL_COMMUNITY): Payer: Self-pay | Admitting: *Deleted

## 2022-07-13 ENCOUNTER — Other Ambulatory Visit (HOSPITAL_COMMUNITY): Payer: Self-pay | Admitting: Cardiology

## 2022-07-13 ENCOUNTER — Ambulatory Visit (HOSPITAL_COMMUNITY)
Admission: RE | Admit: 2022-07-13 | Discharge: 2022-07-13 | Disposition: A | Discharge status: Home / Self Care | Payer: Medicaid Other | Attending: Cardiology | Admitting: Cardiology

## 2022-07-13 DIAGNOSIS — E1122 Type 2 diabetes mellitus with diabetic chronic kidney disease: Secondary | ICD-10-CM | POA: Diagnosis not present

## 2022-07-13 DIAGNOSIS — I130 Hypertensive heart and chronic kidney disease with heart failure and stage 1 through stage 4 chronic kidney disease, or unspecified chronic kidney disease: Secondary | ICD-10-CM | POA: Diagnosis not present

## 2022-07-13 DIAGNOSIS — I2721 Secondary pulmonary arterial hypertension: Secondary | ICD-10-CM | POA: Diagnosis not present

## 2022-07-13 DIAGNOSIS — N183 Chronic kidney disease, stage 3 unspecified: Secondary | ICD-10-CM | POA: Diagnosis not present

## 2022-07-13 DIAGNOSIS — F17210 Nicotine dependence, cigarettes, uncomplicated: Secondary | ICD-10-CM | POA: Diagnosis not present

## 2022-07-13 DIAGNOSIS — J449 Chronic obstructive pulmonary disease, unspecified: Secondary | ICD-10-CM | POA: Insufficient documentation

## 2022-07-13 DIAGNOSIS — Z7984 Long term (current) use of oral hypoglycemic drugs: Secondary | ICD-10-CM | POA: Insufficient documentation

## 2022-07-13 DIAGNOSIS — I5032 Chronic diastolic (congestive) heart failure: Secondary | ICD-10-CM | POA: Diagnosis not present

## 2022-07-13 DIAGNOSIS — Z79899 Other long term (current) drug therapy: Secondary | ICD-10-CM | POA: Insufficient documentation

## 2022-07-13 HISTORY — PX: RIGHT HEART CATH: CATH118263

## 2022-07-13 LAB — POCT I-STAT EG7
Acid-Base Excess: 1 mmol/L (ref 0.0–2.0)
Acid-Base Excess: 0 mmol/L (ref 0.0–2.0)
Bicarbonate: 27.2 mmol/L (ref 20.0–28.0)
Bicarbonate: 25.8 mmol/L (ref 20.0–28.0)
Calcium, Ion: 1.25 mmol/L (ref 1.15–1.40)
Calcium, Ion: 1.26 mmol/L (ref 1.15–1.40)
HCT: 43 % (ref 39.0–52.0)
HCT: 43 % (ref 39.0–52.0)
Hemoglobin: 14.6 g/dL (ref 13.0–17.0)
Hemoglobin: 14.6 g/dL (ref 13.0–17.0)
O2 Saturation: 64 %
O2 Saturation: 66 %
Potassium: 4.4 mmol/L (ref 3.5–5.1)
Potassium: 4.3 mmol/L (ref 3.5–5.1)
Sodium: 141 mmol/L (ref 135–145)
Sodium: 141 mmol/L (ref 135–145)
TCO2: 29 mmol/L (ref 22–32)
TCO2: 27 mmol/L (ref 22–32)
pCO2, Ven: 47.8 mmHg (ref 44–60)
pCO2, Ven: 46.4 mmHg (ref 44–60)
pH, Ven: 7.363 (ref 7.25–7.43)
pH, Ven: 7.352 (ref 7.25–7.43)
pO2, Ven: 35 mmHg (ref 32–45)
pO2, Ven: 36 mmHg (ref 32–45)

## 2022-07-13 LAB — GLUCOSE, CAPILLARY: Glucose-Capillary: 88 mg/dL (ref 70–99)

## 2022-07-13 SURGERY — RIGHT HEART CATH
Anesthesia: LOCAL

## 2022-07-13 MED ORDER — HYDRALAZINE HCL 20 MG/ML IJ SOLN
INTRAMUSCULAR | Status: AC
  Filled 2022-07-13: qty 1

## 2022-07-13 MED ORDER — LIDOCAINE HCL (PF) 1 % IJ SOLN
INTRAMUSCULAR | Status: DC | PRN
  Administered 2022-07-13: 2 mL

## 2022-07-13 MED ORDER — CARVEDILOL 3.125 MG PO TABS
6.2500 mg | ORAL_TABLET | Freq: Once | ORAL | Status: AC
  Administered 2022-07-13: 6.25 mg via ORAL
  Filled 2022-07-13: qty 2

## 2022-07-13 MED ORDER — SODIUM CHLORIDE 0.9% FLUSH: 3.0000 mL | INTRAVENOUS | Status: DC | PRN

## 2022-07-13 MED ORDER — SODIUM CHLORIDE 0.9 % IV SOLN: 250.0000 mL | INTRAVENOUS | Status: DC | PRN

## 2022-07-13 MED ORDER — ONDANSETRON HCL 4 MG/2ML IJ SOLN: 4.0000 mg | Freq: Four times a day (QID) | INTRAMUSCULAR | Status: DC | PRN

## 2022-07-13 MED ORDER — SODIUM CHLORIDE 0.9% FLUSH: 3.0000 mL | Freq: Two times a day (BID) | INTRAVENOUS | Status: DC

## 2022-07-13 MED ORDER — TORSEMIDE 20 MG PO TABS: 40.0000 mg | ORAL_TABLET | Freq: Every day | ORAL | 3 refills | Status: DC

## 2022-07-13 MED ORDER — HYDRALAZINE HCL 20 MG/ML IJ SOLN: 10.0000 mg | INTRAMUSCULAR | Status: DC | PRN

## 2022-07-13 MED ORDER — TORSEMIDE 20 MG PO TABS
40.0000 mg | ORAL_TABLET | Freq: Once | ORAL | Status: DC
  Filled 2022-07-13: qty 2

## 2022-07-13 MED ORDER — ISOSORB DINITRATE-HYDRALAZINE 20-37.5 MG PO TABS: 1.0000 | ORAL_TABLET | Freq: Three times a day (TID) | ORAL | 6 refills | Status: DC

## 2022-07-13 MED ORDER — HEPARIN (PORCINE) IN NACL 1000-0.9 UT/500ML-% IV SOLN
INTRAVENOUS | Status: AC
  Filled 2022-07-13: qty 500

## 2022-07-13 MED ORDER — POTASSIUM CHLORIDE CRYS ER 10 MEQ PO TBCR: 20.0000 meq | EXTENDED_RELEASE_TABLET | Freq: Every day | ORAL | 6 refills | Status: DC

## 2022-07-13 MED ORDER — TORSEMIDE 20 MG PO TABS
40.0000 mg | ORAL_TABLET | Freq: Every day | ORAL | 3 refills | Status: DC
  Filled 2022-07-13: qty 180, 90d supply, fill #0

## 2022-07-13 MED ORDER — HYDRALAZINE HCL 20 MG/ML IJ SOLN
INTRAMUSCULAR | Status: DC | PRN
  Administered 2022-07-13 (×2): 10 mg via INTRAVENOUS

## 2022-07-13 MED ORDER — ISOSORB DINITRATE-HYDRALAZINE 20-37.5 MG PO TABS
1.0000 | ORAL_TABLET | Freq: Three times a day (TID) | ORAL | 3 refills | Status: DC
  Filled 2022-07-13: qty 270, 90d supply, fill #0

## 2022-07-13 MED ORDER — ACETAMINOPHEN 325 MG PO TABS: 650.0000 mg | ORAL_TABLET | ORAL | Status: DC | PRN

## 2022-07-13 MED ORDER — LIDOCAINE HCL (PF) 1 % IJ SOLN
INTRAMUSCULAR | Status: AC
  Filled 2022-07-13: qty 30

## 2022-07-13 MED ORDER — POTASSIUM CHLORIDE CRYS ER 20 MEQ PO TBCR: 20.0000 meq | EXTENDED_RELEASE_TABLET | Freq: Once | ORAL | Status: DC

## 2022-07-13 MED ORDER — SODIUM CHLORIDE 0.9 % IV SOLN: INTRAVENOUS | Status: DC

## 2022-07-13 MED ORDER — ISOSORB DINITRATE-HYDRALAZINE 20-37.5 MG PO TABS
1.0000 | ORAL_TABLET | Freq: Once | ORAL | Status: DC
  Filled 2022-07-13 (×2): qty 1

## 2022-07-13 MED ORDER — HEPARIN (PORCINE) IN NACL 1000-0.9 UT/500ML-% IV SOLN
INTRAVENOUS | Status: DC | PRN
  Administered 2022-07-13: 500 mL

## 2022-07-13 MED ORDER — LABETALOL HCL 5 MG/ML IV SOLN: 10.0000 mg | INTRAVENOUS | Status: DC | PRN

## 2022-07-13 MED ORDER — POTASSIUM CHLORIDE CRYS ER 10 MEQ PO TBCR
20.0000 meq | EXTENDED_RELEASE_TABLET | Freq: Every day | ORAL | 3 refills | Status: DC
  Filled 2022-07-13: qty 180, 90d supply, fill #0

## 2022-07-13 SURGICAL SUPPLY — 7 items
CATH BALLN WEDGE 5F 110CM (CATHETERS) IMPLANT
COVER DOME SNAP 22 D (MISCELLANEOUS) IMPLANT
PACK CARDIAC CATHETERIZATION (CUSTOM PROCEDURE TRAY) ×1 IMPLANT
SHEATH GLIDE SLENDER 4/5FR (SHEATH) IMPLANT
TRANSDUCER W/STOPCOCK (MISCELLANEOUS) ×1 IMPLANT
TUBING ART PRESS 72  MALE/FEM (TUBING) ×1
TUBING ART PRESS 72 MALE/FEM (TUBING) IMPLANT

## 2022-07-13 NOTE — Progress Notes (Signed)
Client refused to stay for ordered medications and left against medical advise; Dr Aundra Dubin notified

## 2022-07-13 NOTE — Interval H&P Note (Signed)
History and Physical Interval Note:  07/13/2022 10:51 AM  Chris [name]  has presented today for surgery, with the diagnosis of hf.  The various methods of treatment have been discussed with the patient and family. After consideration of risks, benefits and other options for treatment, the patient has consented to  Procedure(s): RIGHT HEART CATH (N/A) as a surgical intervention.  The patient's history has been reviewed, patient examined, no change in status, stable for surgery.  I have reviewed the patient's chart and labs.  Questions were answered to the patient's satisfaction.     [name] [employer]

## 2022-07-13 NOTE — Telephone Encounter (Signed)
Called patient per Dr. Aundra Dubin. He had to leave [facility] early due to his ride situation. He is at [facility] now trying to pick up his medications. Rx for following sent electronically to [facility]: Torsemide 40 mg daily  Potassium 20 meq daily  Bidil 1 tablet TID  Pt will need repeat labs in one week; he prefers [place] lab - order placed.  He will see Dr. Aundra Dubin at [facility] on 07/28/22 at 11:00 am.

## 2022-07-13 NOTE — Progress Notes (Signed)
Patients B/P was 173/119. Patient stated that he did not take any of his meds this morning. Rennis Harding, RN made aware.

## 2022-07-13 NOTE — Discharge Instructions (Addendum)
1. Increase torsemide to 40 mg daily (2 tablets in the morning).  2. Add potassium chloride 20 mEq daily.  3. Restart Bidil 1 tab three times per day    Call Dr Claris Gladden office if any problems, questions, or concerns; call if any redness, drainage, fever, pain or swelling right arm iv site

## 2022-07-14 ENCOUNTER — Other Ambulatory Visit: Payer: Self-pay

## 2022-07-15 ENCOUNTER — Other Ambulatory Visit: Payer: Medicaid Other | Admitting: Licensed Clinical Social Worker

## 2022-07-15 NOTE — Patient Instructions (Signed)
  Medicaid Managed Care   Unsuccessful Outreach Note  07/15/2022 Name: Chris [name] MRN: [id number] DOB: August 24, 1960  Referred by: [name], MD Reason for referral : High Risk Managed Medicaid (MM Social work telephone outreach )   An unsuccessful telephone outreach was attempted today. The patient was referred to the case management team for assistance with care management and care coordination.   Follow Up Plan: A HIPAA compliant phone message was left for the patient providing contact information and requesting a return call.  [name], [name], [place] Managed Medicaid Team  [phone]

## 2022-07-15 NOTE — Patient Outreach (Signed)
  Medicaid Managed Care   Unsuccessful Attempt Note   07/15/2022 Name: Chris [name] MRN: [id number] DOB: [birth year]  Referred by: [name], MD Reason for referral : No chief complaint on file.   An unsuccessful telephone outreach was attempted today. The patient was referred to the case management team for assistance with care management and care coordination.    Follow Up Plan: A HIPAA compliant phone message was left for the patient providing contact information and requesting a return call.   Eula Fried, [name], MSW, [employer] Managed Medicaid [email or web address] Phone: [phone]

## 2022-07-15 NOTE — Patient Instructions (Signed)
[name] ,   The [employer] Managed Care Team is available to provide assistance to you with your healthcare needs at no cost and as a benefit of your [facility] plan. I'm sorry I was unable to reach you today for our scheduled appointment. Our care guide will call you to reschedule our telephone appointment. Please call me at the number below. I am available to be of assistance to you regarding your healthcare needs. .   Thank you,   Eula Fried, [name], MSW, LCSW Managed Medicaid [email or web address] Phone: [phone]

## 2022-07-15 NOTE — Patient Outreach (Signed)
  Medicaid Managed Care   Unsuccessful Outreach Note  07/15/2022 Name: Chris [name] MRN: [id number] DOB: 1961/02/26  Referred by: [name], MD Reason for referral : High Risk Managed Medicaid (MM Social work telephone outreach )   An unsuccessful telephone outreach was attempted today. The patient was referred to the case management team for assistance with care management and care coordination.   Follow Up Plan: A HIPAA compliant phone message was left for the patient providing contact information and requesting a return call.   [name], [name], [place] Managed Medicaid Team  [phone]

## 2022-07-20 ENCOUNTER — Other Ambulatory Visit: Payer: Self-pay

## 2022-07-20 ENCOUNTER — Other Ambulatory Visit: Payer: Medicaid Other | Admitting: *Deleted

## 2022-07-20 ENCOUNTER — Emergency Department
Admission: EM | Admit: 2022-07-20 | Discharge: 2022-07-20 | Disposition: A | Discharge status: Home / Self Care | Payer: Medicaid Other | Attending: Emergency Medicine | Admitting: Emergency Medicine

## 2022-07-20 ENCOUNTER — Encounter: Payer: Self-pay | Admitting: Emergency Medicine

## 2022-07-20 DIAGNOSIS — M542 Cervicalgia: Secondary | ICD-10-CM | POA: Insufficient documentation

## 2022-07-20 DIAGNOSIS — R1084 Generalized abdominal pain: Secondary | ICD-10-CM | POA: Insufficient documentation

## 2022-07-20 DIAGNOSIS — I10 Essential (primary) hypertension: Secondary | ICD-10-CM | POA: Diagnosis not present

## 2022-07-20 DIAGNOSIS — N189 Chronic kidney disease, unspecified: Secondary | ICD-10-CM | POA: Diagnosis not present

## 2022-07-20 DIAGNOSIS — M549 Dorsalgia, unspecified: Secondary | ICD-10-CM

## 2022-07-20 DIAGNOSIS — R109 Unspecified abdominal pain: Secondary | ICD-10-CM

## 2022-07-20 DIAGNOSIS — I509 Heart failure, unspecified: Secondary | ICD-10-CM | POA: Insufficient documentation

## 2022-07-20 DIAGNOSIS — M545 Low back pain, unspecified: Secondary | ICD-10-CM | POA: Insufficient documentation

## 2022-07-20 DIAGNOSIS — R1013 Epigastric pain: Secondary | ICD-10-CM | POA: Diagnosis not present

## 2022-07-20 DIAGNOSIS — I130 Hypertensive heart and chronic kidney disease with heart failure and stage 1 through stage 4 chronic kidney disease, or unspecified chronic kidney disease: Secondary | ICD-10-CM | POA: Insufficient documentation

## 2022-07-20 DIAGNOSIS — E1122 Type 2 diabetes mellitus with diabetic chronic kidney disease: Secondary | ICD-10-CM | POA: Diagnosis not present

## 2022-07-20 LAB — CBC
HCT: 44.3 % (ref 39.0–52.0)
Hemoglobin: 14.2 g/dL (ref 13.0–17.0)
MCH: 29.3 pg (ref 26.0–34.0)
MCHC: 32.1 g/dL (ref 30.0–36.0)
MCV: 91.3 fL (ref 80.0–100.0)
Platelets: 236 10*3/uL (ref 150–400)
RBC: 4.85 MIL/uL (ref 4.22–5.81)
RDW: 14.5 % (ref 11.5–15.5)
WBC: 12.6 10*3/uL — ABNORMAL HIGH (ref 4.0–10.5)
nRBC: 0 % (ref 0.0–0.2)

## 2022-07-20 LAB — LIPASE, BLOOD: Lipase: 31 U/L (ref 11–51)

## 2022-07-20 LAB — COMPREHENSIVE METABOLIC PANEL
ALT: 13 U/L (ref 0–44)
AST: 15 U/L (ref 15–41)
Albumin: 3.7 g/dL (ref 3.5–5.0)
Alkaline Phosphatase: 82 U/L (ref 38–126)
Anion gap: 12 (ref 5–15)
BUN: 49 mg/dL — ABNORMAL HIGH (ref 6–20)
CO2: 22 mmol/L (ref 22–32)
Calcium: 8.5 mg/dL — ABNORMAL LOW (ref 8.9–10.3)
Chloride: 103 mmol/L (ref 98–111)
Creatinine, Ser: 2.54 mg/dL — ABNORMAL HIGH (ref 0.61–1.24)
GFR, Estimated: 28 mL/min — ABNORMAL LOW (ref >60)
Glucose, Bld: 96 mg/dL (ref 70–99)
Potassium: 4 mmol/L (ref 3.5–5.1)
Sodium: 137 mmol/L (ref 135–145)
Total Bilirubin: 0.5 mg/dL (ref 0.3–1.2)
Total Protein: 7.4 g/dL (ref 6.5–8.1)

## 2022-07-20 LAB — TROPONIN I (HIGH SENSITIVITY): Troponin I (High Sensitivity): 43 ng/L — ABNORMAL HIGH (ref <18)

## 2022-07-20 MED ORDER — HYDROMORPHONE HCL 1 MG/ML IJ SOLN: 1.0000 mg | Freq: Once | INTRAMUSCULAR | Status: DC

## 2022-07-20 MED ORDER — SODIUM CHLORIDE 0.9 % IV BOLUS: 500.0000 mL | Freq: Once | INTRAVENOUS | Status: DC

## 2022-07-20 MED ORDER — OXYCODONE-ACETAMINOPHEN 5-325 MG PO TABS: 1.0000 | ORAL_TABLET | ORAL | 0 refills | Status: DC | PRN

## 2022-07-20 MED ORDER — OXYCODONE-ACETAMINOPHEN 5-325 MG PO TABS
2.0000 | ORAL_TABLET | Freq: Once | ORAL | Status: AC
  Administered 2022-07-20: 2 via ORAL
  Filled 2022-07-20: qty 2

## 2022-07-20 NOTE — ED Triage Notes (Addendum)
Pt here with abd, back, and neck pain. Pt states pain is epigastric and radiates to his right back. Pt has been coughing but denies N/V. Pt is wearing a cardiac monitor.

## 2022-07-20 NOTE — ED Provider Notes (Signed)
[facility] Provider Note    Event Date/Time   First MD Initiated Contact with Patient 07/20/22 [phone]     (approximate)  History   Chief Complaint: Abdominal Pain and Back Pain  HPI  Chris [name] is a 62 y.o. male with a past medical history of CHF, CKD, COPD, gastric reflux, diabetes, hypertension, presents to the emergency department for abdominal back and neck pain.  According to the patient for the past 1+ years he has been experiencing intermittent pain throughout his abdomen back and neck.  Patient states he has been seen multiple times for the same including 2 weeks ago at [facility].  Patient states he has run out of pain medication and continues to have pain so he came to the emergency department for evaluation.  Patient denies any change in the pain or any acute exacerbation of the pain compared to 2 weeks ago.  Patient had a negative workup performed 2 weeks ago with a CT scan showing no significant finding.  Physical Exam   Triage Vital Signs: ED Triage Vitals  Enc Vitals Group     BP 07/20/22 0810 128/71     Pulse Rate 07/20/22 0810 92     Resp 07/20/22 0810 18     Temp 07/20/22 0810 97.6 F (36.4 C)     Temp Source 07/20/22 0810 Oral     SpO2 07/20/22 0810 95 %     Weight 07/20/22 0810 273 lb (123.8 kg)     Height 07/20/22 0810 6' (1.829 m)     Head Circumference --      Peak Flow --      Pain Score 07/20/22 0814 10     Pain Loc --      Pain Edu? --      Excl. in [place]? --     Most recent vital signs: Vitals:   07/20/22 0810  BP: 128/71  Pulse: 92  Resp: 18  Temp: 97.6 F (36.4 C)  SpO2: 95%    General: Awake, no distress.  CV:  Good peripheral perfusion.  Regular rate and rhythm  Resp:  Normal effort.  Equal breath sounds bilaterally.  Abd:  No distention.  Soft, mild diffuse tenderness more so on the right side.  Patient states this is unchanged x 1 year. Other:  CT and L-spine tenderness to palpation without any  acute focal area of significant tenderness identified.  Patient again states he has been having pain in these areas for over a year.   ED Results / Procedures / Treatments   EKG  EKG viewed and interpreted by myself shows a sinus rhythm at 93 bpm with a narrow QRS, normal axis, slight QTc prolongation otherwise normal intervals patient does have lateral T wave inversions.  Unchanged from prior EKG 06/29/2022.  MEDICATIONS ORDERED IN ED: Medications  oxyCODONE-acetaminophen (PERCOCET/ROXICET) 5-325 MG per tablet 2 tablet (2 tablets Oral Given 07/20/22 0916)     IMPRESSION / MDM / ASSESSMENT AND PLAN / ED COURSE  I reviewed the triage vital signs and the nursing notes.  Patient's presentation is most consistent with acute presentation with potential threat to life or bodily function.  Patient presents emergency department for continued abdominal pain neck and back pain.  Patient states he has been experiencing this pain for over a year but has been worse over the past 1 month.  Patient has been seen at [facility].  I reviewed the patient's chart he was seen  in the emergency department 06/29/2022 for chest pain and shortness of breath.  Ultimately was referred for a cardiac catheterization.  Social work is trying to work with the patient but appears they have been unsuccessful reaching the patient.  Patient appears to be here for acute on chronic pain.  I reviewed his recent labs as well as CT imaging with no significant finding.  Patient denies any significant change in the pain.  We will recheck labs in the emergency department today.  Patient's troponin is slightly elevated however this appears to be largely unchanged from historical values.  Chemistry shows chronic kidney disease, slightly worse than historical values although not significantly changed.  We will repeat a troponin given the slight elevation however I believe this is likely due to the patient's recent heart catheterization  and his chronic kidney disease.  Denies any chest pain or trouble breathing today.  I offered to place an IV for fluids and pain medication.  Patient states he just wants a couple pain pills and does not want an IV placed.  We will dose pain medicationand obtain a CBC which clotted.  If the patient's labs do not show any significant change or abnormality I believe the patient could be discharged home to follow-up with his doctor.  Patient is agreeable to this plan of care.  Patient has had 2 recent narcotic prescriptions filled.  I discussed with the patient I could prescribe him a very short course of pain medication however we will not be prescribing any further pain medication for the patient and he needs to follow-up with his doctor.  CBC does not show any significant finding white count of 12.6.  Given the patient's description of more chronic pain we will discharge with a very short course of pain medication.  Patient will follow-up with his doctor.  Patient agreeable to this plan.  FINAL CLINICAL IMPRESSION(S) / ED DIAGNOSES   Abdominal pain Neck pain Back pain  Rx / DC Orders   Percocet x 12  Note:  This document was prepared using Dragon voice recognition software and may include unintentional dictation errors.   Harvest Dark, MD 07/20/22 1053

## 2022-07-20 NOTE — Patient Outreach (Signed)
Chris [name] is currently in the Emergency Department at [facility]. The [facility] Managed Care team will follow the progress of Chris [name] and follow up upon discharge.    [name] RN, BSN [place]  [employer] manager

## 2022-07-21 ENCOUNTER — Ambulatory Visit: Payer: Medicaid Other

## 2022-07-22 ENCOUNTER — Telehealth: Payer: Self-pay

## 2022-07-22 NOTE — Telephone Encounter (Signed)
..   Medicaid Managed Care   Unsuccessful Outreach Note  07/22/2022 Name: Chris [name] MRN: [id number] DOB: [birth year]  Referred by: [name], MD Reason for referral : Appointment (I called the patient today to get his two missed phone appointments rescheduled with the MM RNCM and LCSW. I left a message on his VM.)   A second unsuccessful telephone outreach was attempted today. The patient was referred to the case management team for assistance with care management and care coordination.   Follow Up Plan: A HIPAA compliant phone message was left for the patient providing contact information and requesting a return call.  The care management team will reach out to the patient again over the next 7 days.   [place]

## 2022-07-23 ENCOUNTER — Other Ambulatory Visit: Payer: Medicaid Other

## 2022-07-23 ENCOUNTER — Other Ambulatory Visit: Payer: Self-pay | Admitting: *Deleted

## 2022-07-23 NOTE — Patient Instructions (Signed)
Visit Information  [name] was given information about Medicaid Managed Care team care coordination services as a part of their Healthy [facility] Medicaid benefit. Chris [name] verbally consented to engagement with the [facility] Managed Care team.   If you are experiencing a medical emergency, please call 911 or report to your local emergency department or urgent care.   If you have a non-emergency medical problem during routine business hours, please contact your provider's office and ask to speak with a nurse.   For questions related to your Healthy [facility] health plan, please call: [phone] or visit the homepage here: [email or web address]  If you would like to schedule transportation through your Healthy [facility] plan, please call the following number at least 2 days in advance of your appointment: [phone]  For information about your ride after you set it up, call Ride Assist at [phone]. Use this number to activate a Will Call pickup, or if your transportation is late for a scheduled pickup. Use this number, too, if you need to make a change or cancel a previously scheduled reservation.  If you need transportation services right away, call [phone]. The after-hours call center is staffed 24 hours to handle ride assistance and urgent reservation requests (including discharges) 365 days a year. Urgent trips include sick visits, hospital discharge requests and life-sustaining treatment.  Call the [place] at [phone], at any time, 24 hours a day, 7 days a week. If you are in danger or need immediate medical attention call 911.  If you would like help to quit smoking, call 1-800-QUIT-NOW (281) 297-5861) OR Espaol: 1-855-Djelo-Ya QO:409462) o para ms informacin haga clic aqu or Text READY to 200-400 to register via text  Patient verbalizes understanding of instructions and care plan provided today  and agrees to view in [place]. Active MyChart status and patient understanding of how to access instructions and care plan via MyChart confirmed with patient.     The Managed Medicaid care management team will reach out to the patient again over the next 7 days.   Chris [name] MHA,BSN,RN,CCM [facility] Managed Care    Following is a copy of your plan of care:  Care Plan : [employer] of Care  Updates made by Clerance Lav, RN since 07/23/2022 12:00 AM     Problem: Health Management Needs Related to Heart Failure and Chronic Pain   Priority: High  Note:   Current Barriers:  Care Coordination needs related to Financial constraints related to utilities needs, Limited social support, and Transportation Chronic Disease Management support and education needs related to CHF   RNCM Clinical Goal(s):  Patient will take all medications exactly as prescribed and will call provider for medication related questions as evidenced by medication review and patient interview demonstrate Ongoing adherence to prescribed treatment plan for CHF as evidenced by medication review continue to work with RN Care Manager to address care management and care coordination needs related to  CHF as evidenced by adherence to CM Team Scheduled appointments work with social worker to address  related to the management of Limited social support, Transportation, and Lack of essential utilities - light/power* related to the management of CHF as evidenced by review of EMR and patient or Education officer, museum report through collaboration with Consulting civil engineer, provider, and care team.   Interventions: Collaboration with LCSW/[name]/Care Guide and Heart Failure RN Inter-disciplinary care team collaboration (see longitudinal plan of care) Evaluation of current treatment plan related to  self management  and patient's adherence to plan as established by provider   Heart Failure Interventions:  (Status:  New goal.) Long Term  Goal Discussed importance of daily weight and advised patient to weigh and record daily Reviewed role of diuretics in prevention of fluid overload and management of heart failure; Discussed the importance of keeping all appointments with provider Assessed social determinant of health barriers  Discussed importance of self assessment reporting of swelling, increased shortness of breath, chest pain, dizziness, or other new or worsened symptom  Pain Interventions:  (Status:  New goal.) Short Term Goal Pain assessment performed Medications reviewed Reviewed provider established plan for pain management Discussed importance of adherence to all scheduled medical appointments Counseled on the importance of reporting any/all new or changed pain symptoms or management strategies to pain management provider Reviewed with patient prescribed pharmacological and nonpharmacological pain relief strategies Assessed social determinant of health barriers Advised patient that the CM team would discuss pain management needs with provider and assist with connection to pain management provider  Patient Goals/Self-Care Activities: Take all medications as prescribed Attend all scheduled provider appointments Call provider office for new concerns or questions  Work with the social worker to address care coordination needs and will continue to work with the clinical team to address health care and disease management related needs call 1-800-273-TALK (toll free, 24 hour hotline) if experiencing a Mental Health or [place] Crisis   Follow Up Plan:  Telephone follow up appointment with care management team member scheduled for:  07/27/22 10am

## 2022-07-23 NOTE — Patient Instructions (Signed)
Visit Information  Mr. Sydow was given information about Medicaid Managed Care team care coordination services as a part of their Healthy [facility] Medicaid benefit. ESTOL GARRIS verbally consented to engagement with the [facility] Managed Care team.   If you are experiencing a medical emergency, please call 911 or report to your local emergency department or urgent care.   If you have a non-emergency medical problem during routine business hours, please contact your provider's office and ask to speak with a nurse.   For questions related to your Healthy [facility] health plan, please call: [phone] or visit the homepage here: [email or web address]  If you would like to schedule transportation through your Healthy [facility] plan, please call the following number at least 2 days in advance of your appointment: [phone]  For information about your ride after you set it up, call Ride Assist at [phone]. Use this number to activate a Will Call pickup, or if your transportation is late for a scheduled pickup. Use this number, too, if you need to make a change or cancel a previously scheduled reservation.  If you need transportation services right away, call [phone]. The after-hours call center is staffed 24 hours to handle ride assistance and urgent reservation requests (including discharges) 365 days a year. Urgent trips include sick visits, hospital discharge requests and life-sustaining treatment.  Call the [place] at [phone], at any time, 24 hours a day, 7 days a week. If you are in danger or need immediate medical attention call 911.  If you would like help to quit smoking, call 1-800-QUIT-NOW (848) 885-6627) OR Espaol: 1-855-Djelo-Ya QO:409462) o para ms informacin haga clic aqu or Text READY to 200-400 to register via text  Mr. Polczynski - following are the goals we discussed in your visit today:    Goals Addressed   None      Social Worker will follow up on 2/16.   [name], [name], [place] Managed Medicaid Team  [phone]   Following is a copy of your plan of care:  There are no care plans that you recently modified to display for this patient.

## 2022-07-23 NOTE — Patient Outreach (Signed)
Medicaid Managed Care   Nurse Care Manager Note  07/23/2022 Name:  Chris [name] MRN:  [id number] DOB:  25-Oct-1960  Chris [name] is an 62 y.o. year old male who is a primary patient of [name], MD.  The [facility] Managed Care Coordination team was consulted for assistance with:    CHF Pain management  Mr. [name] was given information about Medicaid Managed Care Coordination team services today. Chris [name] Patient agreed to services and verbal consent obtained.  Engaged with patient by telephone for initial visit in response to provider referral for case management and/or care coordination services.   Assessments/Interventions:  Review of past medical history, allergies, medications, health status, including review of consultants reports, laboratory and other test data, was performed as part of comprehensive evaluation and provision of chronic care management services.  SDOH (Social Determinants of Health) assessments and interventions performed: SDOH Interventions    Flowsheet Row Patient Outreach Telephone from 07/23/2022 in [place] Patient Outreach Telephone from 07/08/2022 in [place] ED to Hosp-Admission (Discharged) from 04/19/2022 in [place] [facility]U Patient Outreach Telephone from 11/16/2021 in [place] Patient Outreach Telephone from 10/13/2021 in [place] Patient Outreach Telephone from 09/11/2021 in [place] Interventions        Housing Interventions Intervention Not Indicated -- -- -- -- --  Transportation Interventions Other (Comment)  Dispensing optician arranged by [place] for upcoming appointments] -- Patient Resources (Friends/Family) -- Other (Comment)  [Assisted with arranging transportation to Cardiology appointment via [employer] medical transportation [phone] Other (Comment)  [Discussed and provided information for medical transportation provided by [employer]]  Utilities Interventions Other (Comment)  [MM Social WOrk team consulted] -- -- -- -- --  Depression Interventions/Treatment  -- Referral to Psychiatry -- -- -- --  Financial Strain Interventions Other (Comment)  [MM Social Work Support] -- -- -- -- --  Physical Activity Interventions -- -- -- Intervention Not Indicated -- Intervention Not Indicated  Stress Interventions -- [employer], Provide Counseling -- -- -- --  Social Connections Interventions Other (Comment)  [SW team referral] -- -- -- -- --       Care Plan  No Known Allergies  Medications Reviewed Today     Reviewed by Clerance Lav, RN (Registered Nurse) on 07/23/22 at [place] List Status: <None>   Medication Order Taking? Sig Documenting Provider Last Dose Status Informant  Accu-Chek Softclix Lancets lancets [id number] Yes Use as instructed [name], MD Taking Active Self  acetaminophen (TYLENOL) 500 MG tablet [id number] Yes Take 500-1,000 mg by mouth every 6 (six) hours as needed (pain.). [provider] Taking Active Self  albuterol (PROVENTIL) (2.5 MG/3ML) 0.083% nebulizer solution [id number] Yes Take 3 mLs (2.5 mg total) by nebulization every 4 (four) hours as needed for wheezing or shortness of breath. [name], MD Taking Active Self  albuterol (VENTOLIN HFA) 108 (90 Base) MCG/ACT inhaler [id number] Yes Inhale 2 puffs into the lungs every 6 (six) hours as needed for wheezing or shortness of breath. [name], MD Taking Active Self  allopurinol (ZYLOPRIM) 100 MG tablet [id number] Yes TAKE 2 TABLETS (200 MG TOTAL) BY MOUTH DAILY. [name], MD Taking Active Self  aspirin EC 81 MG tablet [id number] Yes Take 1 tablet (81 mg total) by mouth daily. [name], MD Taking  Active Self  atorvastatin (LIPITOR) 40 MG  tablet [id number] Yes Take 1 tablet (40 mg total) by mouth daily. Glendoris Nodarse Graff, FNP Taking Active Self  Blood Glucose Monitoring Suppl (ACCU-CHEK GUIDE) w/Device [name] [id number] Yes Use as directed [name], MD Taking Active Self  budesonide-formoterol [facility]) 160-4.5 MCG/ACT inhaler [id number] Yes Inhale 2 puffs into the lungs 2 (two) times daily. [name], MD Taking Active Self  carvedilol (COREG) 6.25 MG tablet [id number] No Take 1 tablet (6.25 mg total) by mouth 2 (two) times daily.  Patient not taking: Reported on 07/23/2022   Tyce Delcid Graff, FNP Not Taking Active Self  dapagliflozin propanediol (FARXIGA) 10 MG TABS tablet [id number] Yes Take 1 tablet (10 mg total) by mouth daily. Male Minish Graff, FNP Taking Active Self  DULoxetine (CYMBALTA) 20 MG capsule [id number] No Take 1 capsule (20 mg total) by mouth daily.  Patient not taking: Reported on 07/12/2022   [name], MD Not Taking Active Self           Med Note [name], [name]   Thu Jun 17, 2022  3:57 PM)    glucose blood (ACCU-CHEK GUIDE) test strip [id number] Yes Use as directed to check blood sugar 1-2 times a day [name], MD Taking Active Self  isosorbide-hydrALAZINE (BIDIL) 20-37.5 MG tablet [id number] No Take 1 tablet by mouth 3 (three) times daily.  Patient not taking: Reported on 07/23/2022   [name], MD Not Taking Active   methylPREDNISolone (MEDROL DOSEPAK) 4 MG TBPK tablet [id number] No Use as directed  Patient not taking: Reported on 07/12/2022   [name], PA-C Not Taking Active Self           Med Note Clerance Lav   Fri Jul 23, 2022  2:15 PM) Patient reports he completed the dose pack as prescribed    Discontinued 07/11/20 0943 (Discontinued by provider)   montelukast (SINGULAIR) 10 MG tablet [id number] Yes TAKE 1 TABLET (10 MG TOTAL) BY MOUTH AT BEDTIME. [name], MD Taking Active Self  oxyCODONE-acetaminophen (PERCOCET) 5-325 MG tablet [id number] No Take 1 tablet  by mouth every 4 (four) hours as needed for severe pain.  Patient not taking: Reported on 07/23/2022   Harvest Dark, MD Not Taking Active   potassium chloride (KLOR-CON M) 10 MEQ tablet [id number] Yes Take 2 tablets (20 mEq total) by mouth daily. [name], MD Taking Active   Tiotropium Bromide Monohydrate (SPIRIVA RESPIMAT) 2.5 MCG/ACT AERS [id number] Yes Inhale 2 puffs into the lungs daily. [name], MD Taking Active Self  torsemide (DEMADEX) 20 MG tablet [id number] Yes Take 2 tablets (40 mg total) by mouth daily. [name], MD Taking Active            Med Note Clerance Lav   Fri Jul 23, 2022  2:20 PM) Takes 75m every other day 272mon opposite days            Patient Active Problem List   Diagnosis Date Noted   Chronic heart failure with preserved ejection fraction (HFpEF) ([place]01/30/2024   Agitation 04/23/2022   RSV (respiratory syncytial virus pneumonia) 04/22/2022   Obesity (BMI 30-39.9) 04/21/2022   COPD exacerbation ([facility]11/11/2021   Dyslipidemia 04/19/2022   Gout 04/19/2022   Right kidney mass 05/14/2021   CHF exacerbation (HC[place]11/06/2020   Chest pain 04/14/2021   Syncope 04/14/2021   Left-sided weakness 10/28/2020   Typical atrial flutter (HCC)    CHF (congestive heart  failure) ([place]) 07/04/2020   Acute exacerbation of CHF (congestive heart failure) ([place]) 06/16/2020   Influenza vaccine refused 05/06/2020   Acute on chronic combined systolic (congestive) and diastolic (congestive) heart failure ([place]) 05/05/2020   Acute decompensated heart failure ([place]) 05/04/2020   Illiteracy 05/04/2020   Type 2 diabetes mellitus with stage 3 chronic kidney disease ([place]) 12/25/2019   Acute on chronic combined systolic and diastolic CHF (congestive heart failure) ([place]) 10/26/2019   Elevated troponin I level 10/26/2019   Acute on chronic diastolic CHF (congestive heart failure) ([place]) 10/26/2019   History of gout 02/01/2019   Seasonal allergic rhinitis due  to pollen 02/01/2019   Tobacco dependence 11/30/2018   Microscopic hematuria 11/30/2018   Depression 11/30/2018   Difficulty controlling anger 11/30/2018   COPD (chronic obstructive pulmonary disease) ([place])    Acute respiratory failure with hypoxia ([place]) 08/10/2018   CKD (chronic kidney disease) stage 3, GFR 30-59 ml/min (HCC) 08/10/2018   Recurrent epistaxis 04/21/2018   Mixed hyperlipidemia 07/28/2017   Essential hypertension [id number]   Chronic systolic heart failure ([place]) 10/25/2014   Cocaine abuse ([place]) 02/20/2013   Cannabis abuse 02/20/2013   Back pain, chronic 02/20/2013    Conditions to be addressed/monitored per PCP order:  CHF and Pain Management  Care Plan : RN Care Manager Plan of Care  Updates made by Clerance Lav, RN since 07/23/2022 12:00 AM     Problem: Health Management Needs Related to Heart Failure and Chronic Pain   Priority: High  Note:   Current Barriers:  Care Coordination needs related to Financial constraints related to utilities needs, Limited social support, and Transportation Chronic Disease Management support and education needs related to CHF   RNCM Clinical Goal(s):  Patient will take all medications exactly as prescribed and will call provider for medication related questions as evidenced by medication review and patient interview demonstrate Ongoing adherence to prescribed treatment plan for CHF as evidenced by medication review continue to work with RN Care Manager to address care management and care coordination needs related to  CHF as evidenced by adherence to CM Team Scheduled appointments work with Education officer, museum to address  related to the management of Limited social support, Transportation, and Lack of essential utilities - light/power* related to the management of CHF as evidenced by review of EMR and patient or Education officer, museum report through collaboration with Consulting civil engineer, provider, and care team.   Interventions: Collaboration with  LCSW/[name]/Care Guide and Heart Failure RN Inter-disciplinary care team collaboration (see longitudinal plan of care) Evaluation of current treatment plan related to  self management and patient's adherence to plan as established by provider   Heart Failure Interventions:  (Status:  New goal.) Long Term Goal Discussed importance of daily weight and advised patient to weigh and record daily Reviewed role of diuretics in prevention of fluid overload and management of heart failure; Discussed the importance of keeping all appointments with provider Assessed social determinant of health barriers  Discussed importance of self assessment reporting of swelling, increased shortness of breath, chest pain, dizziness, or other new or worsened symptom  Pain Interventions:  (Status:  New goal.) Short Term Goal Pain assessment performed Medications reviewed Reviewed provider established plan for pain management Discussed importance of adherence to all scheduled medical appointments Counseled on the importance of reporting any/all new or changed pain symptoms or management strategies to pain management provider Reviewed with patient prescribed pharmacological and nonpharmacological pain relief strategies Assessed social determinant of health barriers Advised patient  that the CM team would discuss pain management needs with provider and assist with connection to pain management provider  Patient Goals/Self-Care Activities: Take all medications as prescribed Attend all scheduled provider appointments Call provider office for new concerns or questions  Work with the social worker to address care coordination needs and will continue to work with the clinical team to address health care and disease management related needs call 1-800-273-TALK (toll free, 24 hour hotline) if experiencing a Mental Health or [place] Crisis   Follow Up Plan:  Telephone follow up appointment with care management team  member scheduled for:  07/27/22 10am       Follow Up:  Patient agrees to Care Plan and Follow-up.  Plan: The Managed Medicaid care management team will reach out to the patient again over the next 7 days.  Date/time of next scheduled RN care management/care coordination outreach:  07/27/22 10am  Janalyn Shy [facility] Medicaid Managed Care

## 2022-07-23 NOTE — Patient Outreach (Signed)
Medicaid Managed Care Social Work Note  07/23/2022 Name:  Chris [name] MRN:  [id number] DOB:  [birth year]  Chris [name] is an 62 y.o. year old male who is a primary patient of [name], MD.  The Medicaid Managed Care Coordination team was consulted for assistance with:  Community Resources   [name] was given information about Medicaid Managed Care Coordination team services today. [name] Patient agreed to services and verbal consent obtained.  Engaged with patient  for by telephone forfollow up visit in response to referral for case management and/or care coordination services.   Assessments/Interventions:  Review of past medical history, allergies, medications, health status, including review of consultants reports, laboratory and other test data, was performed as part of comprehensive evaluation and provision of chronic care management services.  SDOH: (Social Determinant of Health) assessments and interventions performed: SDOH Interventions    Flowsheet Row Patient Outreach Telephone from 07/08/2022 in [place] ED to Hosp-Admission (Discharged) from 04/19/2022 in [place] [facility] PCU Patient Outreach Telephone from 11/16/2021 in [place] Patient Outreach Telephone from 10/13/2021 in [place] Patient Outreach Telephone from 09/11/2021 in [place] Patient Outreach Telephone from 08/12/2021 in [place] Coordination  SDOH Interventions        Housing Interventions -- -- -- -- -- Intervention Not Indicated  Transportation Interventions -- Patient Resources (Friends/Family) -- Other (Comment)  [Assisted with arranging transportation to Cardiology appointment via [employer] transportation [phone] Other (Comment)  [Discussed and provided information for medical  transportation provided by [employer]] --  Depression Interventions/Treatment  Referral to Psychiatry -- -- -- -- --  Physical Activity Interventions -- -- Intervention Not Indicated -- Intervention Not Indicated --  Stress Interventions Offered [name], Provide Counseling -- -- -- -- --     [name] completed a telephone outreach with patient. He stated he has a stomach bug and is not feeling well. Patient stated that he is in a lot of pain and in need of pain management. Patient states he also wants to go to a new PCP. Patient states he needs assistance with his utilities, he does have a cut off notice. He gets 948 monthly and rent is 750. He was giving the money to his sister to pay his bill, but she was not paying it. [name] will completed a referral for [employer] and assist patient with locating a new PCP.   Advanced Directives Status:  Not addressed in this encounter.  Care Plan                 No Known Allergies  Medications Reviewed Today     Reviewed by [name], RN (Registered Nurse) on 07/20/22 at [phone]  Med List Status: <None>   Medication Order Taking? Sig Documenting Provider Last Dose Status Informant  Accu-Chek Softclix Lancets lancets [id number]  Use as instructed [name], MD  Active Self  acetaminophen (TYLENOL) 500 MG tablet [id number]  Take 500-1,000 mg by mouth every 6 (six) hours as needed (pain.). [provider]  Active Self  albuterol (PROVENTIL) (2.5 MG/3ML) 0.083% nebulizer solution [id number]  Take 3 mLs (2.5 mg total) by nebulization every 4 (four) hours as needed for wheezing or shortness of breath. [name], MD  Active Self  albuterol (VENTOLIN HFA) 108 (90 Base) MCG/ACT inhaler [id number]  Inhale 2 puffs into the  lungs every 6 (six) hours as needed for wheezing or shortness of breath. [name], MD  Active Self  allopurinol (ZYLOPRIM) 100 MG tablet [id number]  TAKE 2 TABLETS (200 MG TOTAL) BY MOUTH DAILY.  [name], MD  Active Self  aspirin EC 81 MG tablet [id number]  Take 1 tablet (81 mg total) by mouth daily. [name], MD  Active Self  atorvastatin (LIPITOR) 40 MG tablet [id number]  Take 1 tablet (40 mg total) by mouth daily. Alisa Graff, FNP  Active Self  Blood Glucose Monitoring Suppl (ACCU-CHEK GUIDE) w/Device [name] [id number]  Use as directed [name], MD  Active Self  budesonide-formoterol [facility]) 160-4.5 MCG/ACT inhaler [id number]  Inhale 2 puffs into the lungs 2 (two) times daily. [name], MD  Active Self  carvedilol (COREG) 6.25 MG tablet [id number]  Take 1 tablet (6.25 mg total) by mouth 2 (two) times daily. [name], FNP  Active Self  dapagliflozin propanediol (FARXIGA) 10 MG TABS tablet [id number]  Take 1 tablet (10 mg total) by mouth daily. [name], FNP  Active Self  DULoxetine (CYMBALTA) 20 MG capsule [id number]  Take 1 capsule (20 mg total) by mouth daily.  Patient not taking: Reported on 07/12/2022   [name], MD  Active Self           Med Note [name], [name]   Thu Jun 17, 2022  3:57 PM)    glucose blood (ACCU-CHEK GUIDE) test strip [id number]  Use as directed to check blood sugar 1-2 times a day [name], MD  Active Self  isosorbide-hydrALAZINE (BIDIL) 20-37.5 MG tablet [id number]  Take 1 tablet by mouth 3 (three) times daily. [name], MD  Active   methylPREDNISolone (MEDROL DOSEPAK) 4 MG TBPK tablet [id number]  Use as directed  Patient not taking: Reported on 07/12/2022   [name], PA-C  Active Self  montelukast (SINGULAIR) 10 MG tablet [id number]  TAKE 1 TABLET (10 MG TOTAL) BY MOUTH AT BEDTIME. [name], MD  Active Self  oxyCODONE-acetaminophen (PERCOCET) 5-325 MG tablet [id number]  Take 1-2 tablets by mouth every 6 (six) hours as needed for severe pain. [name], PA-C  Active Self  potassium chloride (KLOR-CON M) 10 MEQ tablet [id number]  Take 2 tablets (20 mEq total) by mouth  daily. [name], MD  Active   Tiotropium Bromide Monohydrate (SPIRIVA RESPIMAT) 2.5 MCG/ACT AERS [id number]  Inhale 2 puffs into the lungs daily. [name], MD  Active Self  torsemide (DEMADEX) 20 MG tablet [id number]  Take 2 tablets (40 mg total) by mouth daily. [name], MD  Active             Patient Active Problem List   Diagnosis Date Noted   Chronic heart failure with preserved ejection fraction (HFpEF) ([place]) 07/13/2022   Agitation 04/23/2022   RSV (respiratory syncytial virus pneumonia) 04/22/2022   Obesity (BMI 30-39.9) 04/21/2022   COPD exacerbation ([place]) 04/19/2022   Dyslipidemia 04/19/2022   Gout 04/19/2022   Right kidney mass 05/14/2021   CHF exacerbation ([place]) 04/14/2021   Chest pain 04/14/2021   Syncope 04/14/2021   Left-sided weakness 10/28/2020   Typical atrial flutter (HCC)    CHF (congestive heart failure) ([place]) 07/04/2020   Acute exacerbation of CHF (congestive heart failure) ([place]) 06/16/2020   Influenza vaccine refused 05/06/2020   Acute on chronic combined systolic (congestive) and diastolic (congestive) heart failure ([place]) 05/05/2020   Acute decompensated heart failure ([place]) 05/04/2020  Illiteracy 05/04/2020   Type 2 diabetes mellitus with stage 3 chronic kidney disease ([place]) 12/25/2019   Acute on chronic combined systolic and diastolic CHF (congestive heart failure) ([place]) 10/26/2019   Elevated troponin I level 10/26/2019   Acute on chronic diastolic CHF (congestive heart failure) ([place]) 10/26/2019   History of gout 02/01/2019   Seasonal allergic rhinitis due to pollen 02/01/2019   Tobacco dependence 11/30/2018   Microscopic hematuria 11/30/2018   Depression 11/30/2018   Difficulty controlling anger 11/30/2018   COPD (chronic obstructive pulmonary disease) ([place])    Acute respiratory failure with hypoxia ([place]) 08/10/2018   CKD (chronic kidney disease) stage 3, GFR 30-59 ml/min ([place]) 08/10/2018   Recurrent epistaxis 04/21/2018    Mixed hyperlipidemia 07/28/2017   Essential hypertension [id number]   Chronic systolic heart failure ([place]) 10/25/2014   Cocaine abuse ([place]) 02/20/2013   Cannabis abuse 02/20/2013   Back pain, chronic 02/20/2013    Conditions to be addressed/monitored per PCP order:   Community resources  There are no care plans that you recently modified to display for this patient.   Follow up:  Patient agrees to Care Plan and Follow-up.  Plan: The Managed Medicaid care management team will reach out to the patient again over the next 7 days.  Date/time of next scheduled Social Work care management/care coordination outreach:  2/16 [name], [place], [place] Medicaid Team  [phone]

## 2022-07-23 NOTE — Patient Outreach (Signed)
  Medicaid Managed Care   Unsuccessful Outreach Note  07/23/2022 Name: Chris [name] MRN: [id number] DOB: [birth year]  Referred by: [name], MD Reason for referral : High Risk Managed Medicaid (MM social work telephone outreach )   An unsuccessful telephone outreach was attempted today. The patient was referred to the case management team for assistance with care management and care coordination.   Follow Up Plan: A HIPAA compliant phone message was left for the patient providing contact information and requesting a return call.   [name], [name], [place] Managed Medicaid Team  [phone]

## 2022-07-26 ENCOUNTER — Ambulatory Visit: Payer: Medicaid Other

## 2022-07-27 ENCOUNTER — Other Ambulatory Visit: Payer: Medicaid Other | Admitting: Licensed Clinical Social Worker

## 2022-07-27 NOTE — Patient Instructions (Signed)
Visit Information  Chris [name] was given information about Medicaid Managed Care team care coordination services as a part of their Healthy [facility] Medicaid benefit. Chris [name] verbally consented to engagement with the [facility] Managed Care team.   If you are experiencing a medical emergency, please call 911 or report to your local emergency department or urgent care.   If you have a non-emergency medical problem during routine business hours, please contact your provider's office and ask to speak with a nurse.   For questions related to your Healthy [facility] health plan, please call: [phone] or visit the homepage here: [email or web address]  If you would like to schedule transportation through your Healthy [facility] plan, please call the following number at least 2 days in advance of your appointment: [phone]  For information about your ride after you set it up, call Ride Assist at [phone]. Use this number to activate a Will Call pickup, or if your transportation is late for a scheduled pickup. Use this number, too, if you need to make a change or cancel a previously scheduled reservation.  If you need transportation services right away, call [phone]. The after-hours call center is staffed 24 hours to handle ride assistance and urgent reservation requests (including discharges) 365 days a year. Urgent trips include sick visits, hospital discharge requests and life-sustaining treatment.  Call the [place] at [phone], at any time, 24 hours a day, 7 days a week. If you are in danger or need immediate medical attention call 911.  If you would like help to quit smoking, call 1-800-QUIT-NOW (208) 853-5918) OR Espaol: 1-855-Djelo-Ya HD:1601594) o para ms informacin haga clic aqu or Text READY to 200-400 to register via text  Chris [name], [place], MSW, [employer] Managed Medicaid [email or web address] Phone: [phone]

## 2022-07-27 NOTE — Patient Outreach (Signed)
Medicaid Managed Care Social Work Note  07/27/2022 Name:  Chris [name] MRN:  [id number] DOB:  Chris 27, 1962  Chris [name] is an 62 y.o. year old male who is a primary patient of Chris Pier, MD.  The Medicaid Managed Care Coordination team was consulted for assistance with:  Chris [name] and Resources  Chris [name] was given information about Medicaid Managed Care Coordination team services today. Chris [name] Patient agreed to services and verbal consent obtained.  Engaged with patient  for by telephone forfollow up visit in response to referral for case management and/or care coordination services.   Assessments/Interventions:  Review of past medical history, allergies, medications, health status, including review of consultants reports, laboratory and other test data, was performed as part of comprehensive evaluation and provision of chronic care management services.  SDOH: (Social Determinant of Health) assessments and interventions performed: SDOH Interventions    Flowsheet Row Patient Outreach Telephone from 07/27/2022 in [place] Patient Outreach Telephone from 07/23/2022 in [place] Patient Outreach Telephone from 07/08/2022 in [place] [facility] to Hosp-Admission (Discharged) from 04/19/2022 in [place] [facility] Patient Outreach Telephone from 11/16/2021 in [place] Patient Outreach Telephone from 10/13/2021 in [place] Interventions        Housing Interventions -- Intervention Not Indicated -- -- -- --  Transportation Interventions -- Other (Comment)  Dispensing optician arranged by [place] for upcoming appointments] -- Patient Resources (Friends/Family) -- Other (Comment)  [Assisted with arranging transportation to Cardiology appointment via [employer]  medical transportation [phone]  Utilities Interventions -- Other (Comment)  [MM Social WOrk team consulted] -- -- -- --  Depression Interventions/Treatment  -- -- Referral to Psychiatry -- -- --  Financial Strain Interventions -- Other (Comment)  [MM Social Work Support] -- -- -- --  Physical Activity Interventions -- -- -- -- Intervention Not Indicated --  Stress Interventions Provide Counseling, [place], Provide Counseling -- -- --  Social Connections Interventions -- Other (Comment)  [SW team referral] -- -- -- --       Advanced Directives Status:  See Care Plan for related entries.  Care Plan                 No Known Allergies  Medications Reviewed Today     Reviewed by Clerance Lav, RN (Registered Nurse) on 07/23/22 at 1420  Med List Status: <None>   Medication Order Taking? Sig Documenting Provider Last Dose Status Informant  Accu-Chek Softclix Lancets lancets [id number] Yes Use as instructed Chris Pier, MD Taking Active Self  acetaminophen (TYLENOL) 500 MG tablet [id number] Yes Take 500-1,000 mg by mouth every 6 (six) hours as needed (pain.). [provider] Taking Active Self  albuterol (PROVENTIL) (2.5 MG/3ML) 0.083% nebulizer solution [id number] Yes Take 3 mLs (2.5 mg total) by nebulization every 4 (four) hours as needed for wheezing or shortness of breath. [name], MD Taking Active Self  albuterol (VENTOLIN HFA) 108 (90 Base) MCG/ACT inhaler [id number] Yes Inhale 2 puffs into the lungs every 6 (six) hours as needed for wheezing or shortness of breath. [name], MD Taking Active Self  allopurinol (ZYLOPRIM) 100 MG tablet [id number] Yes TAKE 2 TABLETS (200 MG TOTAL) BY MOUTH DAILY. Chris Pier, MD Taking Active Self  aspirin EC 81 MG tablet [id number] Yes  Take 1 tablet (81 mg total) by mouth daily. [name], MD Taking Active Self  atorvastatin (LIPITOR) 40 MG  tablet [id number] Yes Take 1 tablet (40 mg total) by mouth daily. Alisa Graff, FNP Taking Active Self  Blood Glucose Monitoring Suppl (ACCU-CHEK GUIDE) w/Device [name] [id number] Yes Use as directed Chris Pier, MD Taking Active Self  budesonide-formoterol [facility]) 160-4.5 MCG/ACT inhaler [id number] Yes Inhale 2 puffs into the lungs 2 (two) times daily. [name], MD Taking Active Self  carvedilol (COREG) 6.25 MG tablet [id number] No Take 1 tablet (6.25 mg total) by mouth 2 (two) times daily.  Patient not taking: Reported on 07/23/2022   Alisa Graff, FNP Not Taking Active Self  dapagliflozin propanediol (FARXIGA) 10 MG TABS tablet [id number] Yes Take 1 tablet (10 mg total) by mouth daily. Alisa Graff, FNP Taking Active Self  DULoxetine (CYMBALTA) 20 MG capsule [id number] No Take 1 capsule (20 mg total) by mouth daily.  Patient not taking: Reported on 07/12/2022   Chris Pier, MD Not Taking Active Self           Med Note [name], [name]   Thu Jun 17, 2022  3:57 PM)    glucose blood (ACCU-CHEK GUIDE) test strip [id number] Yes Use as directed to check blood sugar 1-2 times a day Chris Pier, MD Taking Active Self  isosorbide-hydrALAZINE (BIDIL) 20-37.5 MG tablet [id number] No Take 1 tablet by mouth 3 (three) times daily.  Patient not taking: Reported on 07/23/2022   [name], MD Not Taking Active   methylPREDNISolone (MEDROL DOSEPAK) 4 MG TBPK tablet [id number] No Use as directed  Patient not taking: Reported on 07/12/2022   [name], PA-C Not Taking Active Self           Med Note Clerance Lav   Fri Jul 23, 2022  2:15 PM) Patient reports he completed the dose pack as prescribed    Discontinued 07/11/20 0943 (Discontinued by provider)   montelukast (SINGULAIR) 10 MG tablet [id number] Yes TAKE 1 TABLET (10 MG TOTAL) BY MOUTH AT BEDTIME. [name], MD Taking Active Self  oxyCODONE-acetaminophen (PERCOCET) 5-325 MG tablet [id number] No Take 1 tablet  by mouth every 4 (four) hours as needed for severe pain.  Patient not taking: Reported on 07/23/2022   Harvest Dark, MD Not Taking Active   potassium chloride (KLOR-CON M) 10 MEQ tablet [id number] Yes Take 2 tablets (20 mEq total) by mouth daily. [name], MD Taking Active   Tiotropium Bromide Monohydrate (SPIRIVA RESPIMAT) 2.5 MCG/ACT AERS [id number] Yes Inhale 2 puffs into the lungs daily. [name], MD Taking Active Self  torsemide (DEMADEX) 20 MG tablet [id number] Yes Take 2 tablets (40 mg total) by mouth daily. [name], MD Taking Active            Med Note Clerance Lav   Fri Jul 23, 2022  2:20 PM) Takes 61m every other day 238mon opposite days            Patient Active Problem List   Diagnosis Date Noted   Chronic heart failure with preserved ejection fraction (HFpEF) (HCChaparrito01/30/2024   Agitation 04/23/2022   RSV (respiratory syncytial virus pneumonia) 04/22/2022   Obesity (BMI 30-39.9) 04/21/2022   COPD exacerbation (HCSher[place]11/11/2021   Dyslipidemia 04/19/2022   Gout 04/19/2022   Right kidney mass 05/14/2021   CHF exacerbation ([facility]11/06/2020   Chest pain 04/14/2021   Syncope 04/14/2021   Left-sided weakness  10/28/2020   Typical atrial flutter (HCC)    CHF (congestive heart failure) ([place]) 07/04/2020   Acute exacerbation of CHF (congestive heart failure) ([place]) 06/16/2020   Influenza vaccine refused 05/06/2020   Acute on chronic combined systolic (congestive) and diastolic (congestive) heart failure ([place]) 05/05/2020   Acute decompensated heart failure ([place]) 05/04/2020   Illiteracy 05/04/2020   Type 2 diabetes mellitus with stage 3 chronic kidney disease ([place]) 12/25/2019   Acute on chronic combined systolic and diastolic CHF (congestive heart failure) ([place]) 10/26/2019   Elevated troponin I level 10/26/2019   Acute on chronic diastolic CHF (congestive heart failure) ([place]) 10/26/2019   History of gout 02/01/2019   Seasonal allergic rhinitis due  to pollen 02/01/2019   Tobacco dependence 11/30/2018   Microscopic hematuria 11/30/2018   Depression 11/30/2018   Difficulty controlling anger 11/30/2018   COPD (chronic obstructive pulmonary disease) ([place])    Acute respiratory failure with hypoxia ([place]) 08/10/2018   CKD (chronic kidney disease) stage 3, GFR 30-59 ml/min (HCC) 08/10/2018   Recurrent epistaxis 04/21/2018   Mixed hyperlipidemia 07/28/2017   Essential hypertension [id number]   Chronic systolic heart failure ([place]) 10/25/2014   Cocaine abuse ([place]) 02/20/2013   Cannabis abuse 02/20/2013   Back pain, chronic 02/20/2013    Conditions to be addressed/monitored per PCP order:  Depression  Care Plan : General Social Work (Adult)  Updates made by Greg Cutter, LCSW since 07/27/2022 12:00 AM     Problem: Depression Identification (Depression)      Long-Range Goal: To increase my self care and gain mental health support   Start Date: 07/08/2022  Priority: High  Note:   Timeframe:  Long-Range Goal Priority:  High Start Date:   07/08/22                    Expected End Date:ongoing    Follow Up Date- 08/19/22 at 1045 am   - begin personal counseling - call and visit an old friend - check out volunteer opportunities - join a support group - laugh; watch a funny movie or comedian - learn and use visualization or guided imagery - perform a random act of kindness - practice relaxation or meditation daily - start or continue a personal journal - talk about feelings with a friend, family or spiritual advisor - practice positive thinking and self-talk    Why is this important?   When you are stressed, down or upset, your body reacts too.  For example, your blood pressure may get higher; you may have a headache or stomachache.  When your emotions get the best of you, your body's ability to fight off cold and flu gets weak.  These steps will help you manage your emotions.   Current Barriers:  Chronic Mental Health needs  related to depression and need for housing Limited social support, Housing barriers, and Mental Health Concerns  Social Isolation ADL IADL limitations Suicidal Ideation/Homicidal Ideation: No  Clinical Social Work Goal(s):  Over the next 120 days, patient will work with LCSW monthly by telephone or in person to reduce or manage symptoms related to depression and relapse prevention patient will work with [name] to address needs related to financial, food and transportation support. Patient wishes to offset medical and housing expenses with financial support as housing resources are very limited at this time  Interventions: Patient interviewed and appropriate assessments performed: brief mental health assessment Discussed plans with patient for ongoing care management follow up and provided patient with direct  contact information for care management team Assisted patient/caregiver with obtaining information about health plan benefits Patient was educated on the Advance Directives process. Patient was encouraged to complete document with PCP.  Advance Directive education was provided to patient again on 07/08/22. Encouraged patient to consider a mental health provider for long term follow up and therapy/counseling and patient is now agreeable. Referral made to [facility] for both counseling and psychiatry.  Patient reports recent agitation due to his inability to meet his daily mental and physical needs. [facility] LCSW provided emotional support and reflective listening.  Solution-Focused Strategies, Mindfulness or Relaxation Training, Active listening / Reflection utilized , Emotional Supportive Provided, and Verbalization of feelings encouraged  Patient declines any current substance use. Patient has a history of cocaine abuse. Relapse prevention education provided.  Patient reports that he has developed social anxiety. He admits that he gets irritable at times and will snap at his loved ones and then will  experience guilt afterwards. Educated patient on coping methods to implement into his daily life to combat anxiety symptoms and stress. Patient denied any current suicidal or homicidal ideations. Encouraged patient to implement deep breathing and grounding exercises into his daily routine due to ongoing anxiety and SOB.  Patient reports that he will start implementing appropriate self-care habits into his daily routine such as: drinking water, staying active around the house, taking his medications as prescribed, combating negative thoughts or emotions and staying connected with his support network. Positive reinforcement provided.  [facility] LCSW made referral for [facility] [name] and [facility] RNCM involvement. Message sent to entire team by [facility] LCSW. 07/27/22 LCSW update- Patient and [facility] LCSW contacted [facility] together and scheduled him for both psychiatry and counseling next month. Patient's counseling is virtual so he will not need transportation assistance for this appointment only for psychiatry. [place] LCSW will update [place] [name]. [facility] LCSW had patient physically write down his appointment reminders as he is having issues with his memory. Self-care education provided.  09/15/21: [name] completed telephone outreach with patient for utility assistance. Patient stated that he has a cut off notice for 09/25/21 and his bill is $700.00. [name] informed patient he can go to the [place] and can also [employer] [name] provided patient with the telephone number for [employer]. Patient stated no other resources are needed at this time.   Patient Self Care Activities:  Ability for insight Agreement to start mental health treatment and to the self-care journey   Patient Coping Strengths:  Self Advocate Able to Communicate Effectively  Patient Self Care Deficits:  Lacks social connections  The following coping skill education was provided for stress relief and mental health management: "When your  car dies or a deadline looms, how do you respond? Long-term, low-grade or acute stress takes a serious toll on your body and mind, so don't ignore feelings of constant tension. Stress is a natural part of life. However, too much stress can harm our health, especially if it continues every day. This is chronic stress and can put you at risk for heart problems like heart disease and depression. Understand what's happening inside your body and learn simple coping skills to combat the negative impacts of everyday stressors.  Types of Stress There are two types of stress: Emotional - types of emotional stress are relationship problems, pressure at work, financial worries, experiencing discrimination or having a major life change. Physical - Examples of physical stress include being sick having pain, not sleeping well,  recovery from an injury or having an alcohol and drug use disorder. Fight or Flight Sudden or ongoing stress activates your nervous system and floods your bloodstream with adrenaline and cortisol, two hormones that raise blood pressure, increase heart rate and spike blood sugar. These changes pitch your body into a fight or flight response. That enabled our ancestors to outrun saber-toothed tigers, and it's helpful today for situations like dodging a car accident. But most modern chronic stressors, such as finances or a challenging relationship, keep your body in that heightened state, which hurts your health. Effects of Too Much Stress If constantly under stress, most of [place] will eventually start to function less well.  Multiple studies link chronic stress to a higher risk of heart disease, stroke, depression, weight gain, memory loss and even premature death, so it's important to recognize the warning signals. Talk to your doctor about ways to manage stress if you're experiencing any of these symptoms: Prolonged periods of poor sleep. Regular, severe headaches. Unexplained weight loss or  gain. Feelings of isolation, withdrawal or worthlessness. Constant anger and irritability. Loss of interest in activities. Constant worrying or obsessive thinking. Excessive alcohol or drug use. Inability to concentrate.  10 Ways to Cope with Chronic Stress It's key to recognize stressful situations as they occur because it allows you to focus on managing how you react. We all need to know when to close our eyes and take a deep breath when we feel tension rising. Use these tips to prevent or reduce chronic stress. 1. Rebalance Work and Home All work and no play? If you're spending too much time at the office, intentionally put more dates in your calendar to enjoy time for fun, either alone or with others. 2. Get Regular Exercise Moving your body on a regular basis balances the nervous system and increases blood circulation, helping to flush out stress hormones. Even a daily 20-minute walk makes a difference. Any kind of exercise can lower stress and improve your mood ? just pick activities that you enjoy and make it a regular habit. 3. Eat Well and Limit Alcohol and Stimulants Alcohol, nicotine and caffeine may temporarily relieve stress but have negative health impacts and can make stress worse in the long run. Well-nourished bodies cope better, so start with a good breakfast, add more organic fruits and vegetables for a well-balanced diet, avoid processed foods and sugar, try herbal tea and drink more water. 4. Connect with Supportive People Talking face to face with another person releases hormones that reduce stress. Lean on those good listeners in your life. 5. [place] Time Do you enjoy gardening, reading, listening to music or some other creative pursuit? Engage in activities that bring you pleasure and joy; research shows that reduces stress by almost half and lowers your heart rate, too. 6. Practice Meditation, Stress Reduction or Yoga Relaxation techniques activate a state of  restfulness that counterbalances your body's fight-or-flight hormones. Even if this also means a 10-minute break in a long day: listen to music, read, go for a walk in nature, do a hobby, take a bath or spend time with a friend. Also consider doing a mindfulness exercise or try a daily deep breathing or imagery practice. Deep Breathing Slow, calm and deep breathing can help you relax. Try these steps to focus on your breathing and repeat as needed. Find a comfortable position and close your eyes. Exhale and drop your shoulders. Breathe in through your nose; fill your lungs and then your  belly. Think of relaxing your body, quieting your mind and becoming calm and peaceful. Breathe out slowly through your nose, relaxing your belly. Think of releasing tension, pain, worries or distress. Repeat steps three and four until you feel relaxed. Imagery This involves using your mind to excite the senses -- sound, vision, smell, taste and feeling. This may help ease your stress. Begin by getting comfortable and then do some slow breathing. Imagine a place you love being at. It could be somewhere from your childhood, somewhere you vacationed or just a place in your imagination. Feel how it is to be in the place you're imagining. Pay attention to the sounds, air, colors, and who is there with you. This is a place where you feel cared for and loved. All is well. You are safe. Take in all the smells, sounds, tastes and feelings. As you do, feel your body being nourished and healed. Feel the calm that surrounds you. Breathe in all the good. Breathe out any discomfort or tension. 7. Sleep Enough If you get less than seven to eight hours of sleep, your body won't tolerate stress as well as it could. If stress keeps you up at night, address the cause, and add extra meditation into your day to make up for the lost z's. Try to get seven to nine hours of sleep each night. Make a regular bedtime schedule. Keep your room  dark and cool. Try to avoid computers, TV, cell phones and tablets before bed. 8. Bond with Connections You Enjoy Go out for a coffee with a friend, chat with a neighbor, call a family member, visit with a clergy member, or even hang out with your pet. Clinical studies show that spending even a short time with a companion animal can cut anxiety levels almost in half. 9. Take a Vacation Getting away from it all can reset your stress tolerance by increasing your mental and emotional outlook, which makes you a happier, more productive person upon return. Leave your cellphone and laptop at home! 10. See a Counselor, Coach or Therapist If negative thoughts overwhelm your ability to make positive changes, it's time to seek professional help. Make an appointment today--your health and life are worth it."     24- Hour Availability:    [facility]  [address] [place], [place] [facility] [phone]   [facility] [employer] [phone]   [place]  [phone]    [place]  [phone] (after hours)   Therapeutic Alternative/Mobile Crisis   [phone]   [place] National Suicide Hotline  838-240-7262 Diamantina Monks) [place] 988   Call 911 or go to emergency room   [facility]  [phone]);  [place] and [employer]  [phone]); Lilla Shook, [place], [place], [place], [place], [place]            07/08/2022   10:52 AM 05/11/2022    4:17 PM 12/21/2021   11:44 AM 08/20/2021    9:57 AM 05/14/2021   12:36 PM  Depression screen PHQ 2/9  Decreased Interest 2 1 2 $ 0 0  Down, Depressed, Hopeless 2 1 1 $ 0 1  PHQ - 2 Score 4 2 3 $ 0 1  Altered sleeping 3 3 3  3  $ Tired, decreased energy 3 3 2  1  $ Change in appetite 2 2 0  0  Feeling bad or failure about yourself  2 2 0  0  Trouble  concentrating 2 2 1  $ 0  Moving slowly or fidgety/restless 0 0 1  0  Suicidal  thoughts 0 0 0  0  PHQ-9 Score 16 14 10  5  $ Difficult doing work/chores Very difficult      New Goal      Follow up:  Patient agrees to Care Plan and Follow-up.  Plan: The Managed Medicaid care management team will reach out to the patient again over the next 30 days.  Date/time of next scheduled Social Work care management/care coordination outreach:  08/19/22 at 54 45 am  Eula Fried, [name], MSW, [place] Medicaid LCSW [email or web address] Phone: [phone]

## 2022-07-28 ENCOUNTER — Ambulatory Visit: Payer: Medicaid Other | Attending: Cardiology | Admitting: Cardiology

## 2022-07-28 VITALS — BP 125/86 | HR 97 | Wt 253.2 lb

## 2022-07-28 DIAGNOSIS — I5032 Chronic diastolic (congestive) heart failure: Secondary | ICD-10-CM | POA: Diagnosis not present

## 2022-07-28 DIAGNOSIS — R55 Syncope and collapse: Secondary | ICD-10-CM | POA: Diagnosis not present

## 2022-07-28 NOTE — Progress Notes (Signed)
PCP: [name], MD HF Cardiology: Dr. Aundra Dubin  62 y.o. 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  Patient was admitted in 1/22 with CHF and found to be in atrial flutter.  Echo showed EF 25% and he was cardioverted to NSR.  He is no longer anticoagulated. Cardiolite in 2/23 showed no ischemia, fixed inferior defect.  Echo in 8/23 showed EF 50% with severe asymmetric septal hypertrophy but no SAM or LVOT gradient.  Cardiac MRI in 12/23 was similar with LV EF 50%, severe asymmetric septal hypertrophy, normal RV, LGE in the basal septum. These studies were concerning for hypertrophic cardiomyopathy.  Patient is an active smoker and used to use cocaine.  He carries history of COPD.    RHC in 1/24 showed elevated filling pressures, CI 2.25, PVR 3.9 WU.  Torsemide was increased.    Patient was seen in the ER in 2/6 with abdominal pain, nausea/vomiting.  At that time, creatinine was up to 2.54.   He returns today for followup of CHF, ?HCM.  He just finished wearing his Zio monitor and needs to turn it in.  He states that he passed out once since last visit here but does not remember the circumstances.  He was wearing a live Zio and we received no notifications of significant arrhythmias. He is smoking 2 cigarettes/day. He is still short of breath walking short distances.  No orthopnea/PND.  No chest pain.  He continues to have limiting joint and knee pain.  He is most concerned today about getting a note to keep him from having to go to court.   He is not very sure of family history, says father and brother had "heart problems."    Labs (1/24): BNP 873, K 3.6, creatinine 1.7 Labs (2/24): K 4, creatinine 2.54  REDS clip: 32%  PMH: 1. Renal cell carcinoma on right: s/p ablation 1/24.  2. Systolic => diastolic CHF: EF low in the past, echo in 1/22 with EF  22%.   - Cardiolite (2/23): Fixed inferior defect, ?artifact.  - Echo (8/23): EF 50%, severe asymmetric septal hypertrophy, no SAM/MR, no LVOT gradient, RV normal.  - Cardiac MRI (12/23): LV EF 50%, severe asymmetric septal hypertrophy, normal RV, LGE in the basal septum.  Possible hypertrophic cardiomyopathy.  - RHC (1/24): mean RA 15, PA 50/31 mean 42, mean PCWP 21, CI 2.25, PVR 3.9 WU, PAPi 1.3 3. Atrial flutter: DCCV 1/22.  He is not anticoagulated.  4. Type 2 diabetes.  5. HTN 6. CKD stage 3 7. Gout 8. Depression 9. COPD: Active smoker.  10. Prior cocaine abuse.   Social History   Socioeconomic History   Marital status: Divorced    Spouse name: Not on file   Number of children: 3   Years of education: Not on file   Highest education level: High school graduate  Occupational History   Occupation: disability  Tobacco Use   Smoking status: Every Day    Packs/day: 1.00    Years: 43.00    Total pack years: 43.00    Types: Cigarettes   Smokeless tobacco: Never   Tobacco comments:        4 cigs daily--05/12/2022  Vaping Use   Vaping Use: Never used  Substance and Sexual Activity   Alcohol use: No   Drug use: Yes    Frequency: 21.0 times  per week    Types: Marijuana, Cocaine    Comment: last use Cocaine- 03/28/2021. Still using marijuana, last use 06/22/21   Sexual activity: Not on file  Other Topics Concern   Not on file  Social History Narrative   ** Merged History Encounter **       Social Determinants of Health   Financial Resource Strain: Medium Risk (07/23/2022)   Overall Financial Resource Strain (CARDIA)    Difficulty of Paying Living Expenses: Somewhat hard  Food Insecurity: No Food Insecurity (04/20/2022)   Hunger Vital Sign    Worried About Running Out of Food in the Last Year: Never true    Ran Out of Food in the Last Year: Never true  Transportation Needs: Unmet Transportation Needs (07/23/2022)   PRAPARE - Hydrologist  (Medical): Yes    Lack of Transportation (Non-Medical): Yes  Physical Activity: Insufficiently Active (11/16/2021)   Exercise Vital Sign    Days of Exercise per Week: 5 days    Minutes of Exercise per Session: 20 min  Stress: Stress Concern Present (07/27/2022)   [place]    Feeling of Stress : Very much  Social Connections: Moderately Isolated (07/23/2022)   Social Connection and Isolation Panel [NHANES]    Frequency of Communication with Friends and Family: Twice a week    Frequency of Social Gatherings with Friends and Family: Once a week    Attends Religious Services: More than 4 times per year    Active Member of [employer] or Organizations: No    Attends Archivist Meetings: Never    Marital Status: Divorced  Human resources officer Violence: Not At Risk (04/20/2022)   Humiliation, Afraid, Rape, and Kick questionnaire    Fear of Current or Ex-Partner: No    Emotionally Abused: No    Physically Abused: No    Sexually Abused: No   Family History  Problem Relation Age of Onset   Heart disease Father    Diabetes Mother    HIV Brother    Healthy Son    Healthy Daughter    ROS: All systems reviewed and negative except as per HPI.   Current Outpatient Medications  Medication Sig Dispense Refill   Accu-Chek Softclix Lancets lancets Use as instructed 100 each 12   acetaminophen (TYLENOL) 500 MG tablet Take 500-1,000 mg by mouth every 6 (six) hours as needed (pain.).     albuterol (PROVENTIL) (2.5 MG/3ML) 0.083% nebulizer solution Take 3 mLs (2.5 mg total) by nebulization every 4 (four) hours as needed for wheezing or shortness of breath. 300 mL 0   albuterol (VENTOLIN HFA) 108 (90 Base) MCG/ACT inhaler Inhale 2 puffs into the lungs every 6 (six) hours as needed for wheezing or shortness of breath. 6.7 g 0   allopurinol (ZYLOPRIM) 100 MG tablet TAKE 2 TABLETS (200 MG TOTAL) BY MOUTH DAILY. 180 tablet 0   aspirin EC 81 MG  tablet Take 1 tablet (81 mg total) by mouth daily. 30 tablet 0   atorvastatin (LIPITOR) 40 MG tablet Take 1 tablet (40 mg total) by mouth daily. 90 tablet 3   Blood Glucose Monitoring Suppl (ACCU-CHEK GUIDE) w/Device KIT Use as directed 1 kit 0   budesonide-formoterol (SYMBICORT) 160-4.5 MCG/ACT inhaler Inhale 2 puffs into the lungs 2 (two) times daily. 10.2 g 0   carvedilol (COREG) 6.25 MG tablet Take 1 tablet (6.25 mg total) by mouth 2 (two) times daily. 180 tablet  3   dapagliflozin propanediol (FARXIGA) 10 MG TABS tablet Take 1 tablet (10 mg total) by mouth daily. 90 tablet 3   DULoxetine (CYMBALTA) 20 MG capsule Take 1 capsule (20 mg total) by mouth daily. 30 capsule 3   glucose blood (ACCU-CHEK GUIDE) test strip Use as directed to check blood sugar 1-2 times a day 100 each 12   methylPREDNISolone (MEDROL DOSEPAK) 4 MG TBPK tablet Use as directed 21 tablet 0   montelukast (SINGULAIR) 10 MG tablet TAKE 1 TABLET (10 MG TOTAL) BY MOUTH AT BEDTIME. 30 tablet 0   oxyCODONE-acetaminophen (PERCOCET) 5-325 MG tablet Take 1 tablet by mouth every 4 (four) hours as needed for severe pain. 12 tablet 0   potassium chloride (KLOR-CON M) 10 MEQ tablet Take 2 tablets (20 mEq total) by mouth daily. 180 tablet 3   Tiotropium Bromide Monohydrate (SPIRIVA RESPIMAT) 2.5 MCG/ACT AERS Inhale 2 puffs into the lungs daily. 4 g 0   torsemide (DEMADEX) 20 MG tablet Take by mouth daily. Take 50m every other day alternating with 227mevery other day.     No current facility-administered medications for this visit.   BP 125/86   Pulse 97   Wt 253 lb 4 oz (114.9 kg)   SpO2 98%   BMI 34.35 kg/m  General: NAD Neck: No JVD, no thyromegaly or thyroid nodule.  Lungs: Clear to auscultation bilaterally with normal respiratory effort. CV: Nondisplaced PMI.  Heart regular S1/S2, no S3/S4, no murmur.  No peripheral edema.  No carotid bruit.  Normal pedal pulses.  Abdomen: Soft, nontender, no hepatosplenomegaly, no  distention.  Skin: Intact without lesions or rashes.  Neurologic: Alert and oriented x 3.  Psych: Normal affect. Extremities: No clubbing or cyanosis.  HEENT: Normal.   1. Chronic diastolic CHF/?hypertrophic cardiomyopathy: Prior history of HFrEF that may have been tachycardia-mediated in setting of atrial flutter in 1/22, now EF has recovered. Cardiolite in 2/23 showed no ischemia, fixed inferior defect.  Echo in 8/23 showed EF 50% with severe asymmetric septal hypertrophy but no SAM or LVOT gradient.  Cardiac MRI in 12/23 was similar with LV EF 50%, severe asymmetric septal hypertrophy, normal RV, LGE in the basal septum. These studies were concerning for hypertrophic cardiomyopathy.  RHC in 1/24 showed elevated filling pressures with CI 2.25.  Family history somewhat nebulous, father and brother with "heart problems."  Syncopal episodes may have been orthostatic based on chronic lightheadedness with standing, but I have worried about ventricular arrhythmias given possible HCM and LGE noted on cardiac MRI. He says he had another syncopal episode while wearing Zio monitor, but we were not sent any alerts for worrisome arrhythmia ([place] live [place]  I am not sure that these syncopal events are actually arrhythmic.  He is not volume overloaded today by exam or REDS clip though symptoms remain NYHA class III. Suspect COPD plays a role in dyspnea.  - We are awaiting the results of [facility] gene testing to look for HCM mutations.  - He just completed Zio monitor to look for ventricular arrhythmias due to recent syncope. If he has syncope again, should go to the hospital.  Monitor will need to be sent in and I will await results.  - He may need EP referral to consider ICD, would like to get Zio results first.  - Continue torsemide 40 daily alternating with 20 daily.  Will need BMET/BNP today given recent ER visit with nausea/vomiting and increased creatinine.  - Continue Coreg 6.25 mg bid.  -  Continue  dapagliflozin 10 mg daily.  2. CKD stage 3: BMET today, recent ER visit with nausea/vomiting and increased creatinine.   3. COPD: Patient is active smoker and carries this history.  I strongly encouraged him to quit smoking today.  He is down to 2 cigarettes/day.   4. Atrial flutter: S/p DCCV in 1/22.  I am unclear about why he is no longer anticoagulated and he does not know.  He is getting Zio monitor (see above), will need to readdress anticoagulation.   Followup in 3 wks with Zio and [facility] results have returned.   Loralie Champagne 07/28/2022

## 2022-07-28 NOTE — Patient Instructions (Addendum)
Medication Changes:  No changes to your medications.  Lab Work: Come back to the main entrance to get your lab work completed.    Special Instructions // Education:  Do the following things EVERYDAY: Weigh yourself in the morning before breakfast. Write it down and keep it in a log. Take your medicines as prescribed Eat low salt foods--Limit salt (sodium) to 2000 mg per day.  Stay as active as you can everyday Limit all fluids for the day to less than 2 liters   Follow-Up in: follow up with Dr. Aundra Dubin in three weeks.   A letter was printed and given.   If you have any questions or concerns before your next appointment please send [place] a message through [place] or call our office at [phone] Monday-Friday 8 am-5 pm.   If you have an urgent need after hours on the weekend please call your Primary Cardiologist or the [facility] in [place] at [phone].

## 2022-07-28 NOTE — Progress Notes (Signed)
   07/28/22 1206  ReDS Vest / Clip  Station Marker D  Ruler Value 42  ReDS Actual Value 32    JB vm

## 2022-07-30 ENCOUNTER — Other Ambulatory Visit: Payer: Medicaid Other

## 2022-07-30 NOTE — Patient Outreach (Signed)
Medicaid Managed Care Social Work Note  07/30/2022 Name:  Chris [name] MRN:  [id number] DOB:  [birth year]  Chris [name] is an 62 y.o. year old male who is a primary patient of [name], MD.  The Medicaid Managed Care Coordination team was consulted for assistance with:  Community Resources   [name] was given information about Medicaid Managed Care Coordination team services today. Chris [name] Patient agreed to services and verbal consent obtained.  Engaged with patient  for by telephone forfollow up visit in response to referral for case management and/or care coordination services.   Assessments/Interventions:  Review of past medical history, allergies, medications, health status, including review of consultants reports, laboratory and other test data, was performed as part of comprehensive evaluation and provision of chronic care management services.  SDOH: (Social Determinant of Health) assessments and interventions performed: SDOH Interventions    Flowsheet Row Patient Outreach Telephone from 07/27/2022 in [place] Patient Outreach Telephone from 07/23/2022 in [place] Patient Outreach Telephone from 07/08/2022 in [place] ED to Hosp-Admission (Discharged) from 04/19/2022 in [place] [facility] Patient Outreach Telephone from 11/16/2021 in [place] Patient Outreach Telephone from 10/13/2021 in [place] Interventions        Housing Interventions -- Intervention Not Indicated -- -- -- --  Transportation Interventions -- Other (Comment)  Dispensing optician arranged by [place] for upcoming appointments] -- Patient Resources (Friends/Family) -- Other (Comment)  [Assisted with arranging transportation to Cardiology appointment via [employer] medical  transportation [phone]  Utilities Interventions -- Other (Comment)  [MM Social WOrk team consulted] -- -- -- --  Depression Interventions/Treatment  -- -- Referral to Psychiatry -- -- --  Financial Strain Interventions -- Other (Comment)  [MM Social Work Support] -- -- -- --  Physical Activity Interventions -- -- -- -- Intervention Not Indicated --  Stress Interventions Provide Counseling, [place], Provide Counseling -- -- --  Social Connections Interventions -- Other (Comment)  [SW team referral] -- -- -- --     [name] completed a telephone outreach with patient. He stated that he was doing okay. He has not heard anything from Chris [name] regarding his utility bill, [name] encouraged patient to contact Chris [name] to get an update. [name] also sent HB rep and email to follow up.  Advanced Directives Status:  Not addressed in this encounter.  Care Plan                 No Known Allergies  Medications Reviewed Today     Reviewed by Harvie Junior, CMA (Certified Medical Assistant) on 07/28/22 at 1125  Med List Status: <None>   Medication Order Taking? Sig Documenting Provider Last Dose Status Informant  Accu-Chek Softclix Lancets lancets [id number] Yes Use as instructed [name], MD Taking Active Self  acetaminophen (TYLENOL) 500 MG tablet [id number] Yes Take 500-1,000 mg by mouth every 6 (six) hours as needed (pain.). [provider] Taking Active Self  albuterol (PROVENTIL) (2.5 MG/3ML) 0.083% nebulizer solution [id number] Yes Take 3 mLs (2.5 mg total) by nebulization every 4 (four) hours as needed for wheezing or shortness of breath. [name], MD Taking Active Self  albuterol (VENTOLIN HFA) 108 (90 Base) MCG/ACT inhaler [id number] Yes Inhale 2 puffs into the lungs every 6 (six) hours as needed  for wheezing or shortness of breath. [name], MD Taking Active Self  allopurinol (ZYLOPRIM) 100  MG tablet [id number] Yes TAKE 2 TABLETS (200 MG TOTAL) BY MOUTH DAILY. [name], MD Taking Active Self  aspirin EC 81 MG tablet [id number] Yes Take 1 tablet (81 mg total) by mouth daily. [name], MD Taking Active Self  atorvastatin (LIPITOR) 40 MG tablet [id number] Yes Take 1 tablet (40 mg total) by mouth daily. Alisa Graff, FNP Taking Active Self  Blood Glucose Monitoring Suppl (ACCU-CHEK GUIDE) w/Device [name] [id number] Yes Use as directed [name], MD Taking Active Self  budesonide-formoterol [facility]) 160-4.5 MCG/ACT inhaler [id number] Yes Inhale 2 puffs into the lungs 2 (two) times daily. [name], MD Taking Active Self  carvedilol (COREG) 6.25 MG tablet [id number] Yes Take 1 tablet (6.25 mg total) by mouth 2 (two) times daily. Alisa Graff, FNP Taking Active Self  dapagliflozin propanediol (FARXIGA) 10 MG TABS tablet [id number] Yes Take 1 tablet (10 mg total) by mouth daily. Alisa Graff, FNP Taking Active Self  DULoxetine (CYMBALTA) 20 MG capsule [id number] Yes Take 1 capsule (20 mg total) by mouth daily. [name], MD Taking Active Self           Med Note [name], [name]   Thu Jun 17, 2022  3:57 PM)    glucose blood (ACCU-CHEK GUIDE) test strip [id number] Yes Use as directed to check blood sugar 1-2 times a day [name], MD Taking Active Self  methylPREDNISolone (MEDROL DOSEPAK) 4 MG TBPK tablet [id number] Yes Use as directed [name], PA-C Taking Active Self           Med Note Clerance Lav   Fri Jul 23, 2022  2:15 PM) Patient reports he completed the dose pack as prescribed  Discontinued 07/11/20 0943 (Discontinued by provider)   montelukast (SINGULAIR) 10 MG tablet [id number] Yes TAKE 1 TABLET (10 MG TOTAL) BY MOUTH AT BEDTIME. [name], MD Taking Active Self  oxyCODONE-acetaminophen (PERCOCET) 5-325 MG tablet [id number] Yes Take 1 tablet by mouth every 4 (four) hours as needed for severe pain. Harvest Dark, MD  Taking Active   potassium chloride (KLOR-CON M) 10 MEQ tablet [id number] Yes Take 2 tablets (20 mEq total) by mouth daily. [name], MD Taking Active   Tiotropium Bromide Monohydrate (SPIRIVA RESPIMAT) 2.5 MCG/ACT AERS [id number] Yes Inhale 2 puffs into the lungs daily. [name], MD Taking Active Self  torsemide (DEMADEX) 20 MG tablet [id number] Yes Take 2 tablets (40 mg total) by mouth daily. [name], MD Taking Active            Med Note Clerance Lav   Fri Jul 23, 2022  2:20 PM) Takes 51m every other day 24mon opposite days            Patient Active Problem List   Diagnosis Date Noted   Chronic heart failure with preserved ejection fraction (HFpEF) ([facility]01/30/2024   Agitation 04/23/2022   RSV (respiratory syncytial virus pneumonia) 04/22/2022   Obesity (BMI 30-39.9) 04/21/2022   COPD exacerbation ([facility]11/11/2021   Dyslipidemia 04/19/2022   Gout 04/19/2022   Right kidney mass 05/14/2021   CHF exacerbation ([facility]11/06/2020   Chest pain 04/14/2021   Syncope 04/14/2021   Left-sided weakness 10/28/2020   Typical atrial flutter (HCC)    CHF (congestive heart failure) (HCH[place]01/21/2022   Acute exacerbation of CHF (congestive heart failure) (HCL[place]01/08/2020   Influenza vaccine refused  05/06/2020   Acute on chronic combined systolic (congestive) and diastolic (congestive) heart failure ([place]) 05/05/2020   Acute decompensated heart failure ([place]) 05/04/2020   Illiteracy 05/04/2020   Type 2 diabetes mellitus with stage 3 chronic kidney disease ([place]) 12/25/2019   Acute on chronic combined systolic and diastolic CHF (congestive heart failure) ([place]) 10/26/2019   Elevated troponin I level 10/26/2019   Acute on chronic diastolic CHF (congestive heart failure) ([place]) 10/26/2019   History of gout 02/01/2019   Seasonal allergic rhinitis due to pollen 02/01/2019   Tobacco dependence 11/30/2018   Microscopic hematuria 11/30/2018   Depression 11/30/2018   Difficulty  controlling anger 11/30/2018   COPD (chronic obstructive pulmonary disease) ([place])    Acute respiratory failure with hypoxia ([place]) 08/10/2018   CKD (chronic kidney disease) stage 3, GFR 30-59 ml/min ([place]) 08/10/2018   Recurrent epistaxis 04/21/2018   Mixed hyperlipidemia 07/28/2017   Essential hypertension [id number]   Chronic systolic heart failure ([place]) 10/25/2014   Cocaine abuse ([place]) 02/20/2013   Cannabis abuse 02/20/2013   Back pain, chronic 02/20/2013    Conditions to be addressed/monitored per PCP order:   community resources  There are no care plans that you recently modified to display for this patient.   Follow up:  Patient agrees to Care Plan and Follow-up.  Plan: The Managed Medicaid care management team will reach out to the patient again over the next 30 days.  Date/time of next scheduled Social Work care management/care coordination outreach:  07/29/22  [name], Arita Miss, [place] Medicaid Team  [phone]

## 2022-07-30 NOTE — Patient Instructions (Signed)
Visit Information  Chris [name] was given information about Medicaid Managed Care team care coordination services as a part of their [employer] Medicaid benefit. [name] verbally consented to engagement with the [facility] Managed Care team.   If you are experiencing a medical emergency, please call 911 or report to your local emergency department or urgent care.   If you have a non-emergency medical problem during routine business hours, please contact your provider's office and ask to speak with a nurse.   For questions related to your Healthy [facility] health plan, please call: [phone] or visit the homepage here: [email or web address]  If you would like to schedule transportation through your Healthy [facility] plan, please call the following number at least 2 days in advance of your appointment: [phone]  For information about your ride after you set it up, call Ride Assist at [phone]. Use this number to activate a Will Call pickup, or if your transportation is late for a scheduled pickup. Use this number, too, if you need to make a change or cancel a previously scheduled reservation.  If you need transportation services right away, call [phone]. The after-hours call center is staffed 24 hours to handle ride assistance and urgent reservation requests (including discharges) 365 days a year. Urgent trips include sick visits, hospital discharge requests and life-sustaining treatment.  Call the [place] at [phone], at any time, 24 hours a day, 7 days a week. If you are in danger or need immediate medical attention call 911.  If you would like help to quit smoking, call 1-800-QUIT-NOW 905-751-7986) OR Espaol: 1-855-Djelo-Ya HD:1601594) o para ms informacin haga clic aqu or Text READY to 200-400 to register via text  Mr. Hayman - following are the goals we discussed in your visit today:    Goals Addressed   None       Social Worker will follow up in 30 days.   [name], [name], [place] Managed Medicaid Team  [phone]   Following is a copy of your plan of care:  There are no care plans that you recently modified to display for this patient.

## 2022-08-02 ENCOUNTER — Telehealth (HOSPITAL_COMMUNITY): Payer: Self-pay

## 2022-08-02 ENCOUNTER — Telehealth: Payer: Self-pay

## 2022-08-02 NOTE — Patient Outreach (Signed)
[name] completed a telephone outreach with [facility] for pain management. [name] contacted [employer] on [address] and she stated a referral is needed from patients PCP, if patient does not have a PCP Romelle Starcher can provide one. [name] will send PCP a message for a referral to be sent over.   [name], [name], [place] Managed Medicaid Team  [phone]

## 2022-08-02 NOTE — Patient Outreach (Signed)
[name] sent an email to [employer], she stated she had just spoke with patient and he does qualify for the once in a lifetime 200 stipend for [employer]. They will send [facility] a check for 200. [name] contacted patient and he stated he did speak with [employer]. Patient also states that he is in a lot of pain and is wanting something for pain. [name] will send PCP a message.   [name], [name], [place] Managed Medicaid Team  [phone]

## 2022-08-02 NOTE — Telephone Encounter (Signed)
Patient called about letter Dr. Aundra Dubin wrote for him. He wants to pick up in our office tomorrow. It was set up for him not to go to court yet. He needs letter to state he cannot go to court because of his health and stress condition. Can you look into this?

## 2022-08-02 NOTE — Patient Instructions (Signed)
Visit Information  Mr. Avetisyan was given information about Medicaid Managed Care team care coordination services as a part of their Healthy [facility] Medicaid benefit. NEKODA GOLDWIRE verbally consented to engagement with the [facility] Managed Care team.   If you are experiencing a medical emergency, please call 911 or report to your local emergency department or urgent care.   If you have a non-emergency medical problem during routine business hours, please contact your provider's office and ask to speak with a nurse.   For questions related to your Healthy [facility] health plan, please call: [phone] or visit the homepage here: [email or web address]  If you would like to schedule transportation through your Healthy [facility] plan, please call the following number at least 2 days in advance of your appointment: [phone]  For information about your ride after you set it up, call Ride Assist at [phone]. Use this number to activate a Will Call pickup, or if your transportation is late for a scheduled pickup. Use this number, too, if you need to make a change or cancel a previously scheduled reservation.  If you need transportation services right away, call [phone]. The after-hours call center is staffed 24 hours to handle ride assistance and urgent reservation requests (including discharges) 365 days a year. Urgent trips include sick visits, hospital discharge requests and life-sustaining treatment.  Call the [place] at [phone], at any time, 24 hours a day, 7 days a week. If you are in danger or need immediate medical attention call 911.  If you would like help to quit smoking, call 1-800-QUIT-NOW 7133726659) OR Espaol: 1-855-Djelo-Ya HD:1601594) o para ms informacin haga clic aqu or Text READY to 200-400 to register via text  Mr. Verdugo - following are the goals we discussed in your visit today:    Goals Addressed   None      Social Worker will follow up.   [name], [name], [place] Managed Medicaid Team  [phone]   Following is a copy of your plan of care:  There are no care plans that you recently modified to display for this patient.

## 2022-08-03 ENCOUNTER — Ambulatory Visit: Payer: Medicaid Other | Attending: Cardiology | Admitting: Cardiology

## 2022-08-03 ENCOUNTER — Other Ambulatory Visit: Payer: Self-pay

## 2022-08-03 ENCOUNTER — Encounter: Payer: Self-pay | Admitting: *Deleted

## 2022-08-03 ENCOUNTER — Encounter: Payer: Self-pay | Admitting: Cardiology

## 2022-08-03 VITALS — BP 160/92 | HR 83 | Ht 72.0 in | Wt 253.0 lb

## 2022-08-03 DIAGNOSIS — F17200 Nicotine dependence, unspecified, uncomplicated: Secondary | ICD-10-CM

## 2022-08-03 DIAGNOSIS — I517 Cardiomegaly: Secondary | ICD-10-CM | POA: Diagnosis not present

## 2022-08-03 DIAGNOSIS — I10 Essential (primary) hypertension: Secondary | ICD-10-CM | POA: Diagnosis not present

## 2022-08-03 DIAGNOSIS — I5020 Unspecified systolic (congestive) heart failure: Secondary | ICD-10-CM

## 2022-08-03 DIAGNOSIS — I4892 Unspecified atrial flutter: Secondary | ICD-10-CM

## 2022-08-03 MED ORDER — APIXABAN 5 MG PO TABS
5.0000 mg | ORAL_TABLET | Freq: Two times a day (BID) | ORAL | 2 refills | Status: DC
  Filled 2022-08-03: qty 60, 30d supply, fill #0

## 2022-08-03 MED ORDER — ISOSORB DINITRATE-HYDRALAZINE 20-37.5 MG PO TABS
1.0000 | ORAL_TABLET | Freq: Three times a day (TID) | ORAL | 3 refills | Status: DC
  Filled 2022-08-03: qty 270, 90d supply, fill #0

## 2022-08-03 NOTE — Telephone Encounter (Signed)
Hey I sent a message about this yesterday. Can you guys look into updating this letter and let me know? I can print here and have patient pick up here if ok to update.  Thanks

## 2022-08-03 NOTE — Telephone Encounter (Signed)
Spoke w/pt, letter updated, he will pick up tomorrow

## 2022-08-03 NOTE — Patient Instructions (Signed)
Medication Instructions:   STOP ASPIRIN START Eliquis 5 MG - take one tablet ( 17m) by mouth twice a day.  START Bidil - take one tablet ( 20-37.542m by mouth three times a day.   *If you need a refill on your cardiac medications before your next appointment, please call your pharmacy*   Lab Work:  None Ordered  If you have labs (blood work) drawn today and your tests are completely normal, you will receive your results only by: My[place]if you have MyChart) OR A paper copy in the mail If you have any lab test that is abnormal or we need to change your treatment, we will call you to review the results.   Testing/Procedures:  Your physician has requested that you have an echocardiogram in 3 months. Echocardiography is a painless test that uses sound waves to create images of your heart. It provides your doctor with information about the size and shape of your heart and how well your heart's chambers and valves are working. This procedure takes approximately one hour. There are no restrictions for this procedure. Please do NOT wear cologne, perfume, aftershave, or lotions (deodorant is allowed). Please arrive 15 minutes prior to your appointment time.     Follow-Up: [facility] needs are our [facility].  As part of our continuing mission to provide you with exceptional heart care, we have created designated Provider Care Teams.  These Care Teams include your primary Cardiologist (physician) and Advanced Practice Providers (APPs -  Physician Assistants and Nurse Practitioners) who all work together to provide you with the care you need, when you need it.  We recommend signing up for the patient portal called "MyChart".  Sign up information is provided on this After Visit Summary.  MyChart is used to connect with patients for Virtual Visits (Telemedicine).  Patients are able to view lab/test results, encounter notes, upcoming appointments, etc.   Non-urgent messages can be sent to your provider as well.   To learn more about what you can do with MyChart, go to [email or web address]   Your next appointment:    After Echocardiogram  Provider:   BrKate SableMD ONLY
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
## 2022-08-04 NOTE — Addendum Note (Signed)
Encounter addended by: Micki Riley, RN on: 08/04/2022 9:31 AM  Actions taken: Imaging Exam ended

## 2022-08-05 ENCOUNTER — Telehealth: Payer: Self-pay | Admitting: Licensed Clinical Social Worker

## 2022-08-05 ENCOUNTER — Encounter: Payer: Self-pay | Admitting: *Deleted

## 2022-08-05 ENCOUNTER — Other Ambulatory Visit: Payer: Medicaid Other | Admitting: *Deleted

## 2022-08-05 ENCOUNTER — Other Ambulatory Visit: Payer: Self-pay

## 2022-08-05 NOTE — Patient Instructions (Signed)
Visit Information  Chris [name] was given information about Medicaid Managed Care team care coordination services as a part of their Healthy [facility] Medicaid benefit. Chris [name] verbally consented to engagement with the [facility] Managed Care team.   If you are experiencing a medical emergency, please call 911 or report to your local emergency department or urgent care.   If you have a non-emergency medical problem during routine business hours, please contact your provider's office and ask to speak with a nurse.   For questions related to your Healthy [facility] health plan, please call: [phone] or visit the homepage here: [email or web address]  If you would like to schedule transportation through your Healthy [facility] plan, please call the following number at least 2 days in advance of your appointment: [phone]  For information about your ride after you set it up, call Ride Assist at [phone]. Use this number to activate a Will Call pickup, or if your transportation is late for a scheduled pickup. Use this number, too, if you need to make a change or cancel a previously scheduled reservation.  If you need transportation services right away, call [phone]. The after-hours call center is staffed 24 hours to handle ride assistance and urgent reservation requests (including discharges) 365 days a year. Urgent trips include sick visits, hospital discharge requests and life-sustaining treatment.  Call the [place] at [phone], at any time, 24 hours a day, 7 days a week. If you are in danger or need immediate medical attention call 911.  If you would like help to quit smoking, call 1-800-QUIT-NOW 418-202-7996) OR Espaol: 1-855-Djelo-Ya QO:409462) o para ms informacin haga clic aqu or Text READY to 200-400 to register via text

## 2022-08-05 NOTE — Patient Outreach (Signed)
Care Coordination  08/05/2022  Chris [name] 1960-12-03 [id number]  [facility] LCSW completed care coordination with [name] who completed visit today. Patient has lost his piece of paper that he wrote down his mental health appointment information on and was wondering when that appointment is and where. [facility] LCSW completed call to patient and provided this information.  Eula Fried, [name], MSW, [facility] Managed Medicaid LCSW [email or web address] Phone: [phone]

## 2022-08-05 NOTE — Patient Instructions (Signed)
Visit Information  Chris [name] was given information about Medicaid Managed Care team care coordination services as a part of their [facility] Medicaid benefit. [name] verbally consented to engagement with the [facility] Managed Care team.   If you are experiencing a medical emergency, please call 911 or report to your local emergency department or urgent care.   If you have a non-emergency medical problem during routine business hours, please contact your provider's office and ask to speak with a nurse.   For questions related to your [employer] [facility] health plan, please call: [phone] or visit the homepage here: [email or web address]  If you would like to schedule transportation through your [facility] plan, please call the following number at least 2 days in advance of your appointment: [phone]  For information about your ride after you set it up, call Ride Assist at [phone]. Use this number to activate a Will Call pickup, or if your transportation is late for a scheduled pickup. Use this number, too, if you need to make a change or cancel a previously scheduled reservation.  If you need transportation services right away, call [phone]. The after-hours call center is staffed 24 hours to handle ride assistance and urgent reservation requests (including discharges) 365 days a year. Urgent trips include sick visits, hospital discharge requests and life-sustaining treatment.  Call the [facility] at [phone], at any time, 24 hours a day, 7 days a week. If you are in danger or need immediate medical attention call 911.  If you would like help to quit smoking, call 1-800-QUIT-NOW (617)159-9792) OR Espaol: 1-855-Djelo-Ya HD:1601594) o para ms informacin haga clic aqu or Text READY to 200-400 to register via text  [name],   Please see education materials related to HF provided by  MyChart link.  Patient verbalizes understanding of instructions and care plan provided today and agrees to view in [place]. Active MyChart status and patient understanding of how to access instructions and care plan via MyChart confirmed with patient.     Telephone follow up appointment with Managed Medicaid care management team member scheduled for:09/06/22 @ 10:30am  [name] RN, BSN [name] RN Care Coordinator   Following is a copy of your plan of care:  Care Plan : RN Care Manager Plan of Care  Updates made by [name], RN since 08/05/2022 12:00 AM     Problem: Health Management Needs Related to Heart Failure and Chronic Pain   Priority: High  Note:   Current Barriers:    RNCM Clinical Goal(s):  Patient will take all medications exactly as prescribed and will call provider for medication related questions as evidenced by medication review and patient interview demonstrate Ongoing adherence to prescribed treatment plan for CHF as evidenced by medication review continue to work with RN Care Manager to address care management and care coordination needs related to  CHF as evidenced by adherence to CM Team Scheduled appointments work with Education officer, museum to address  related to the management of Limited social support, Transportation, and Lack of essential utilities - light/power* related to the management of CHF as evidenced by review of EMR and patient or Education officer, museum report through collaboration with Consulting civil engineer, provider, and care team.   Interventions: Collaboration with LCSW/[name]/Care Guide and Heart Failure RN Inter-disciplinary care team collaboration (see longitudinal plan of care) Evaluation of current treatment plan related to  self management and patient's adherence to plan as established by  provider   Heart Failure Interventions:  (Status:  New goal.) Long Term Goal Discussed importance of daily weight and advised patient to  weigh and record daily Reviewed role of diuretics in prevention of fluid overload and management of heart failure; Discussed the importance of keeping all appointments with provider Assessed social determinant of health barriers  Discussed importance of self assessment reporting of swelling, increased shortness of breath, chest pain, dizziness, or other new or worsened symptom  Pain Interventions:  (Status:  New goal.) Short Term Goal Pain assessment performed Medications reviewed Reviewed provider established plan for pain management Discussed importance of adherence to all scheduled medical appointments Counseled on the importance of reporting any/all new or changed pain symptoms or management strategies to pain management provider Reviewed with patient prescribed pharmacological and nonpharmacological pain relief strategies Assessed social determinant of health barriers Advised patient that the CM team would discuss pain management needs with provider and assist with connection to pain management provider  Patient Goals/Self-Care Activities: Take all medications as prescribed Attend all scheduled provider appointments Call provider office for new concerns or questions  Work with the social worker to address care coordination needs and will continue to work with the clinical team to address health care and disease management related needs call 1-800-273-TALK (toll free, 24 hour hotline) if experiencing a Mental Health or Behavioral Health Crisis   Follow Up Plan:  Telephone follow up appointment with care management team member scheduled for:  07/27/22 10am      Long-Range Goal: Independent self health management of Heart Failure and Chronic Pain   Start Date: 07/23/2022  Expected End Date: 10/21/2022  Recent Progress: On track  Priority: High  Note:   Current Barriers:  Knowledge Deficits related to plan of care for management of CHF and Chronic Pain Care Coordination needs related to  Transportation  Non-adherence to prescribed medication regimen Difficulty obtaining medications  RNCM Clinical Goal(s):  Patient will take all medications exactly as prescribed and will call provider for medication related questions as evidenced by medication review and patient interview demonstrate Ongoing adherence to prescribed treatment plan for CHF as evidenced by medication review continue to work with RN Care Manager to address care management and care coordination needs related to  CHF as evidenced by adherence to CM Team Scheduled appointments work with Education officer, museum to address  related to the management of Limited social support, Transportation, and Lack of essential utilities - light/power* related to the management of CHF as evidenced by review of EMR and patient or Education officer, museum report through collaboration with Consulting civil engineer, provider, and care team.   Interventions: Collaboration with LCSW/[name]/Care Guide and Heart Failure RN Inter-disciplinary care team collaboration (see longitudinal plan of care) Evaluation of current treatment plan related to  self management and patient's adherence to plan as established by provider Assisted with arranging transportation to appointment on 08/09/22 with PCP, pick up @ 8:35 am with return ride home at 10:45am; and appointment on 08/20/22 with [facility] Clinic, pick up time 1:15 pm with return ride home at 3:15pm   Heart Failure Interventions:  (Status:  ongoing.) Long Term Goal Discussed importance of daily weight and advised patient to weigh and record daily Reviewed role of diuretics in prevention of fluid overload and management of heart failure; Discussed the importance of keeping all appointments with provider Assessed social determinant of health barriers  Discussed importance of self assessment reporting of swelling, increased shortness of breath, chest pain, dizziness, or other new or  worsened symptom Reviewed recent provider notes, advised  patient to pick up Eliquis and start taking as prescribed, explained to patient the importance of taking this medication RNCM contacted pharmacy to ensure eliquis is ready for pick up  Pain Interventions:  (Status:  ongoing.) Short Term Goal Pain assessment performed Medications reviewed: discussed not taking cymbalta-never picked up prescription Reviewed provider established plan for pain management Discussed importance of adherence to all scheduled medical appointments Counseled on the importance of reporting any/all new or changed pain symptoms or management strategies to pain management provider Reviewed with patient prescribed pharmacological and nonpharmacological pain relief strategies Assessed social determinant of health barriers Advised patient that the CM team would discuss pain management needs with provider and assist with connection to pain management provider RNCM collaborated with Pharmacy, requested refill for cymbalta, advised patient to take medication as prescribed Discussed the benefits of cymbalta  Patient Goals/Self-Care Activities: Take all medications as prescribed Attend all scheduled provider appointments Call provider office for new concerns or questions  Work with the social worker to address care coordination needs and will continue to work with the clinical team to address health care and disease management related needs call 1-800-273-TALK (toll free, 24 hour hotline) if experiencing a Mental Health or Behavioral Health Crisis   Follow Up Plan:  Telephone follow up appointment with care management team member scheduled for:  07/27/22 10am

## 2022-08-05 NOTE — Patient Outreach (Signed)
Medicaid Managed Care   Nurse Care Manager Note  08/05/2022 Name:  DINA MURGA MRN:  [id number] DOB:  08-28-1960  MEER SCHUERGER is an 62 y.o. year old male who is a primary patient of [name], MD.  The [facility] Managed Care Coordination team was consulted for assistance with:    CHF Chronic pain  Mr. Ackerman was given information about Medicaid Managed Care Coordination team services today. [name] Patient agreed to services and verbal consent obtained.  Engaged with patient by telephone for follow up visit in response to provider referral for case management and/or care coordination services.   Assessments/Interventions:  Review of past medical history, allergies, medications, health status, including review of consultants reports, laboratory and other test data, was performed as part of comprehensive evaluation and provision of chronic care management services.  SDOH (Social Determinants of Health) assessments and interventions performed: SDOH Interventions    Flowsheet Row Patient Outreach Telephone from 07/27/2022 in [facility] [place] Patient Outreach Telephone from 07/23/2022 in [place] Patient Outreach Telephone from 07/08/2022 in [facility] to Hosp-Admission (Discharged) from 04/19/2022 in [facility] Patient Outreach Telephone from 11/16/2021 in [place] Patient Outreach Telephone from 10/13/2021 in [facility] Interventions        Housing Interventions -- Intervention Not Indicated -- -- -- --  Transportation Interventions -- Other (Comment)  Dispensing optician arranged by [place] for upcoming appointments] -- Patient Resources (Friends/Family) -- Other (Comment)  [Assisted with arranging transportation to Cardiology appointment via [employer]  medical transportation [phone]  Utilities Interventions -- Other (Comment)  [MM Social WOrk team consulted] -- -- -- --  Depression Interventions/Treatment  -- -- Referral to Psychiatry -- -- --  Financial Strain Interventions -- Other (Comment)  [MM Social Work Support] -- -- -- --  Physical Activity Interventions -- -- -- -- Intervention Not Indicated --  Stress Interventions Provide Counseling, [place], Provide Counseling -- -- --  Social Connections Interventions -- Other (Comment)  [SW team referral] -- -- -- --       Care Plan  No Known Allergies  Medications Reviewed Today     Reviewed by [name], RN (Registered Nurse) on 08/05/22 at [place] List Status: <None>   Medication Order Taking? Sig Documenting Provider Last Dose Status Informant  Accu-Chek Softclix Lancets lancets [id number] Yes Use as instructed [name], MD Taking Active Self  acetaminophen (TYLENOL) 500 MG tablet [id number] No Take 500-1,000 mg by mouth every 6 (six) hours as needed (pain.).  Patient not taking: Reported on 08/05/2022   [provider] Not Taking Active Self  albuterol (PROVENTIL) (2.5 MG/3ML) 0.083% nebulizer solution [id number] Yes Take 3 mLs (2.5 mg total) by nebulization every 4 (four) hours as needed for wheezing or shortness of breath. [name], MD Taking Active Self  albuterol (VENTOLIN HFA) 108 (90 Base) MCG/ACT inhaler [id number] Yes Inhale 2 puffs into the lungs every 6 (six) hours as needed for wheezing or shortness of breath. [name], MD Taking Active Self  allopurinol (ZYLOPRIM) 100 MG tablet [id number] Yes TAKE 2 TABLETS (200 MG TOTAL) BY MOUTH DAILY. [name], MD Taking Active Self  apixaban (ELIQUIS) 5 MG TABS tablet [id number] No Take 1 tablet (5 mg total) by mouth 2 (two) times daily.  Patient not taking: Reported on 08/05/2022   [name], MD Not Taking  Active            Med Note (Mattias Walmsley A   Thu Aug 05, 2022 10:23 AM) Needs to pick up from pharmacy  atorvastatin (LIPITOR) 40 MG tablet [id number] Yes Take 1 tablet (40 mg total) by mouth daily. Alisa Graff, FNP Taking Active Self  Blood Glucose Monitoring Suppl (ACCU-CHEK GUIDE) w/Device [name] [id number] Yes Use as directed [name], MD Taking Active Self  budesonide-formoterol [facility]) 160-4.5 MCG/ACT inhaler [id number] Yes Inhale 2 puffs into the lungs 2 (two) times daily. [name], MD Taking Active Self  carvedilol (COREG) 6.25 MG tablet [id number] Yes Take 1 tablet (6.25 mg total) by mouth 2 (two) times daily. Alisa Graff, FNP Taking Active Self  dapagliflozin propanediol (FARXIGA) 10 MG TABS tablet [id number] Yes Take 1 tablet (10 mg total) by mouth daily. Alisa Graff, FNP Taking Active Self  DULoxetine (CYMBALTA) 20 MG capsule [id number] No Take 1 capsule (20 mg total) by mouth daily.  Patient not taking: Reported on 08/05/2022   [name], MD Not Taking Active Self           Med Note [name], [name]   Thu Jun 17, 2022  3:57 PM)    glucose blood (ACCU-CHEK GUIDE) test strip [id number] Yes Use as directed to check blood sugar 1-2 times a day [name], MD Taking Active Self  isosorbide-hydrALAZINE (BIDIL) 20-37.5 MG tablet [id number] Yes Take 1 tablet by mouth 3 (three) times daily. [name], MD Taking Active   methylPREDNISolone (MEDROL DOSEPAK) 4 MG TBPK tablet [id number] No Use as directed  Patient not taking: Reported on 08/03/2022   [name], PA-C Not Taking Active Self           Med Note Clerance Lav   Fri Jul 23, 2022  2:15 PM) Patient reports he completed the dose pack as prescribed    Discontinued 07/11/20 0943 (Discontinued by provider)   montelukast (SINGULAIR) 10 MG tablet [id number] Yes TAKE 1 TABLET (10 MG TOTAL) BY MOUTH AT BEDTIME. [name], MD Taking Active Self  oxyCODONE-acetaminophen (PERCOCET)  5-325 MG tablet [id number] No Take 1 tablet by mouth every 4 (four) hours as needed for severe pain.  Patient not taking: Reported on 08/05/2022   Harvest Dark, MD Not Taking Active   potassium chloride (KLOR-CON M) 10 MEQ tablet [id number] Yes Take 2 tablets (20 mEq total) by mouth daily. [name], MD Taking Active   Tiotropium Bromide Monohydrate (SPIRIVA RESPIMAT) 2.5 MCG/ACT AERS [id number] Yes Inhale 2 puffs into the lungs daily. [name], MD Taking Active Self  torsemide (DEMADEX) 20 MG tablet [id number] Yes Take by mouth daily. Take 64m every other day alternating with 222mevery other day. [provider] Taking Active             Patient Active Problem List   Diagnosis Date Noted   Chronic heart failure with preserved ejection fraction (HFpEF) ([facility]01/30/2024   Agitation 04/23/2022   RSV (respiratory syncytial virus pneumonia) 04/22/2022   Obesity (BMI 30-39.9) 04/21/2022   COPD exacerbation ([facility]11/11/2021   Dyslipidemia 04/19/2022   Gout 04/19/2022   Right kidney mass 05/14/2021   CHF exacerbation ([facility]11/06/2020   Chest pain 04/14/2021   Syncope 04/14/2021   Left-sided weakness 10/28/2020   Typical atrial flutter (HCC)    CHF (congestive heart failure) ([facility]01/21/2022   Acute exacerbation  of CHF (congestive heart failure) ([place]) 06/16/2020   Influenza vaccine refused 05/06/2020   Acute on chronic combined systolic (congestive) and diastolic (congestive) heart failure ([place]) 05/05/2020   Acute decompensated heart failure ([place]) 05/04/2020   Illiteracy 05/04/2020   Type 2 diabetes mellitus with stage 3 chronic kidney disease ([place]) 12/25/2019   Acute on chronic combined systolic and diastolic CHF (congestive heart failure) ([place]) 10/26/2019   Elevated troponin I level 10/26/2019   Acute on chronic diastolic CHF (congestive heart failure) ([place]) 10/26/2019   History of gout 02/01/2019   Seasonal allergic rhinitis due to pollen 02/01/2019    Tobacco dependence 11/30/2018   Microscopic hematuria 11/30/2018   Depression 11/30/2018   Difficulty controlling anger 11/30/2018   COPD (chronic obstructive pulmonary disease) ([place])    Acute respiratory failure with hypoxia ([place]) 08/10/2018   CKD (chronic kidney disease) stage 3, GFR 30-59 ml/min (HCC) 08/10/2018   Recurrent epistaxis 04/21/2018   Mixed hyperlipidemia 07/28/2017   Essential hypertension [id number]   Chronic systolic heart failure ([place]) 10/25/2014   Cocaine abuse ([place]) 02/20/2013   Cannabis abuse 02/20/2013   Back pain, chronic 02/20/2013    Conditions to be addressed/monitored per PCP order:  CHF and chronic pain  Care Plan : RN Care Manager Plan of Care  Updates made by [name], RN since 08/05/2022 12:00 AM     Problem: Health Management Needs Related to Heart Failure and Chronic Pain   Priority: High  Note:   Current Barriers:    RNCM Clinical Goal(s):  Patient will take all medications exactly as prescribed and will call provider for medication related questions as evidenced by medication review and patient interview demonstrate Ongoing adherence to prescribed treatment plan for CHF as evidenced by medication review continue to work with RN Care Manager to address care management and care coordination needs related to  CHF as evidenced by adherence to CM Team Scheduled appointments work with Education officer, museum to address  related to the management of Limited social support, Transportation, and Lack of essential utilities - light/power* related to the management of CHF as evidenced by review of EMR and patient or Education officer, museum report through collaboration with Consulting civil engineer, provider, and care team.   Interventions: Collaboration with LCSW/[name]/Care Guide and Heart Failure RN Inter-disciplinary care team collaboration (see longitudinal plan of care) Evaluation of current treatment plan related to  self management and patient's adherence to plan as  established by provider   Heart Failure Interventions:  (Status:  New goal.) Long Term Goal Discussed importance of daily weight and advised patient to weigh and record daily Reviewed role of diuretics in prevention of fluid overload and management of heart failure; Discussed the importance of keeping all appointments with provider Assessed social determinant of health barriers  Discussed importance of self assessment reporting of swelling, increased shortness of breath, chest pain, dizziness, or other new or worsened symptom  Pain Interventions:  (Status:  New goal.) Short Term Goal Pain assessment performed Medications reviewed Reviewed provider established plan for pain management Discussed importance of adherence to all scheduled medical appointments Counseled on the importance of reporting any/all new or changed pain symptoms or management strategies to pain management provider Reviewed with patient prescribed pharmacological and nonpharmacological pain relief strategies Assessed social determinant of health barriers Advised patient that the CM team would discuss pain management needs with provider and assist with connection to pain management provider  Patient Goals/Self-Care Activities: Take all medications as prescribed Attend all scheduled provider appointments  Call provider office for new concerns or questions  Work with the social worker to address care coordination needs and will continue to work with the clinical team to address health care and disease management related needs call 1-800-273-TALK (toll free, 24 hour hotline) if experiencing a Mental Health or [place] Crisis   Follow Up Plan:  Telephone follow up appointment with care management team member scheduled for:  07/27/22 10am      Long-Range Goal: Independent self health management of Heart Failure and Chronic Pain   Start Date: 07/23/2022  Expected End Date: 10/21/2022  Recent Progress: On track   Priority: High  Note:   Current Barriers:  Knowledge Deficits related to plan of care for management of CHF and Chronic Pain Care Coordination needs related to Transportation  Non-adherence to prescribed medication regimen Difficulty obtaining medications  RNCM Clinical Goal(s):  Patient will take all medications exactly as prescribed and will call provider for medication related questions as evidenced by medication review and patient interview demonstrate Ongoing adherence to prescribed treatment plan for CHF as evidenced by medication review continue to work with RN Care Manager to address care management and care coordination needs related to  CHF as evidenced by adherence to CM Team Scheduled appointments work with Education officer, museum to address  related to the management of Limited social support, Transportation, and Lack of essential utilities - light/power* related to the management of CHF as evidenced by review of EMR and patient or Education officer, museum report through collaboration with Consulting civil engineer, provider, and care team.   Interventions: Collaboration with LCSW/[name]/Care Guide and Heart Failure RN Inter-disciplinary care team collaboration (see longitudinal plan of care) Evaluation of current treatment plan related to  self management and patient's adherence to plan as established by provider Assisted with arranging transportation to appointment on 08/09/22 with PCP, pick up @ 8:35 am with return ride home at 10:45am; and appointment on 08/20/22 with [facility] Clinic, pick up time 1:15 pm with return ride home at 3:15pm   Heart Failure Interventions:  (Status:  ongoing.) Long Term Goal Discussed importance of daily weight and advised patient to weigh and record daily Reviewed role of diuretics in prevention of fluid overload and management of heart failure; Discussed the importance of keeping all appointments with provider Assessed social determinant of health barriers  Discussed importance of  self assessment reporting of swelling, increased shortness of breath, chest pain, dizziness, or other new or worsened symptom Reviewed recent provider notes, advised patient to pick up Eliquis and start taking as prescribed, explained to patient the importance of taking this medication RNCM contacted pharmacy to ensure eliquis is ready for pick up  Pain Interventions:  (Status:  ongoing.) Short Term Goal Pain assessment performed Medications reviewed: discussed not taking cymbalta-never picked up prescription Reviewed provider established plan for pain management Discussed importance of adherence to all scheduled medical appointments Counseled on the importance of reporting any/all new or changed pain symptoms or management strategies to pain management provider Reviewed with patient prescribed pharmacological and nonpharmacological pain relief strategies Assessed social determinant of health barriers Advised patient that the CM team would discuss pain management needs with provider and assist with connection to pain management provider RNCM collaborated with Pharmacy, requested refill for cymbalta, advised patient to take medication as prescribed Discussed the benefits of cymbalta  Patient Goals/Self-Care Activities: Take all medications as prescribed Attend all scheduled provider appointments Call provider office for new concerns or questions  Work with the social worker to  address care coordination needs and will continue to work with the clinical team to address health care and disease management related needs call 1-800-273-TALK (toll free, 24 hour hotline) if experiencing a Mental Health or [place] Crisis   Follow Up Plan:  Telephone follow up appointment with care management team member scheduled for:  07/27/22 10am      Follow Up:  Patient agrees to Care Plan and Follow-up.  Plan: The Managed Medicaid care management team will reach out to the patient again over the next  30 days.  Date/time of next scheduled RN care management/care coordination outreach:  3:25/24 @ 10:30am  [name] RN, [place] RN Care Coordinator

## 2022-08-09 ENCOUNTER — Other Ambulatory Visit: Payer: Self-pay

## 2022-08-09 ENCOUNTER — Ambulatory Visit: Payer: Medicaid Other | Attending: Internal Medicine | Admitting: Internal Medicine

## 2022-08-09 ENCOUNTER — Telehealth (HOSPITAL_COMMUNITY): Payer: Self-pay

## 2022-08-09 ENCOUNTER — Encounter: Payer: Self-pay | Admitting: Internal Medicine

## 2022-08-09 ENCOUNTER — Ambulatory Visit: Payer: Medicaid Other | Admitting: Internal Medicine

## 2022-08-09 VITALS — BP 131/74 | HR 82 | Temp 97.7°F | Ht 72.0 in | Wt 255.0 lb

## 2022-08-09 DIAGNOSIS — N1832 Chronic kidney disease, stage 3b: Secondary | ICD-10-CM | POA: Insufficient documentation

## 2022-08-09 DIAGNOSIS — E1169 Type 2 diabetes mellitus with other specified complication: Secondary | ICD-10-CM

## 2022-08-09 DIAGNOSIS — E1159 Type 2 diabetes mellitus with other circulatory complications: Secondary | ICD-10-CM

## 2022-08-09 DIAGNOSIS — I472 Ventricular tachycardia, unspecified: Secondary | ICD-10-CM | POA: Diagnosis not present

## 2022-08-09 DIAGNOSIS — Z8679 Personal history of other diseases of the circulatory system: Secondary | ICD-10-CM

## 2022-08-09 DIAGNOSIS — N184 Chronic kidney disease, stage 4 (severe): Secondary | ICD-10-CM

## 2022-08-09 DIAGNOSIS — Z85528 Personal history of other malignant neoplasm of kidney: Secondary | ICD-10-CM | POA: Insufficient documentation

## 2022-08-09 DIAGNOSIS — Z5320 Procedure and treatment not carried out because of patient's decision for unspecified reasons: Secondary | ICD-10-CM | POA: Diagnosis not present

## 2022-08-09 DIAGNOSIS — E669 Obesity, unspecified: Secondary | ICD-10-CM | POA: Diagnosis not present

## 2022-08-09 DIAGNOSIS — I152 Hypertension secondary to endocrine disorders: Secondary | ICD-10-CM | POA: Diagnosis not present

## 2022-08-09 DIAGNOSIS — I422 Other hypertrophic cardiomyopathy: Secondary | ICD-10-CM | POA: Insufficient documentation

## 2022-08-09 DIAGNOSIS — M10072 Idiopathic gout, left ankle and foot: Secondary | ICD-10-CM | POA: Diagnosis not present

## 2022-08-09 DIAGNOSIS — J439 Emphysema, unspecified: Secondary | ICD-10-CM | POA: Diagnosis not present

## 2022-08-09 LAB — POCT GLYCOSYLATED HEMOGLOBIN (HGB A1C): HbA1c, POC (controlled diabetic range): 6.2 % (ref 0.0–7.0)

## 2022-08-09 LAB — GLUCOSE, POCT (MANUAL RESULT ENTRY): POC Glucose: 97 mg/dl (ref 70–99)

## 2022-08-09 MED ORDER — COLCHICINE 0.6 MG PO TABS
0.3000 mg | ORAL_TABLET | ORAL | 1 refills | Status: DC
  Filled 2022-08-09: qty 8, 34d supply, fill #0
  Filled 2022-10-20 (×2): qty 8, 34d supply, fill #1
  Filled 2022-11-26: qty 8, 34d supply, fill #2

## 2022-08-09 MED ORDER — PREDNISONE 20 MG PO TABS
ORAL_TABLET | ORAL | 0 refills | Status: AC
  Filled 2022-08-09: qty 9, 12d supply, fill #0

## 2022-08-09 NOTE — Patient Instructions (Signed)
I have sent a prescription for prednisone for the gout. We have changed colchicine so that you take a half a tablet 3 days a week only.  Do not take this medicine every day as it will adversely interact with carvedilol and your poor kidney function.

## 2022-08-09 NOTE — Progress Notes (Signed)
Patient ID: Chris [name], male    DOB: [birth year]  MRN: [id number]  CC: Diabetes (DM f/u. Nanci Pina flare ups)   Subjective: Chris [name] is a 62 y.o. male who presents for chronic ds management His concerns today include:  Pt with hx of NICM EF 25-35% on nuclear ST 07/2021,  combined CHF EF 50%, HTN, DM type 2, HL, CKD 3b, gout, tob dep, subst abuse (cocaine),  COPD, chronic resp failure with hypoxia was on home O2,. OSA    DM: Results for orders placed or performed in visit on 08/09/22  POCT glucose (manual entry)  Result Value Ref Range   POC Glucose 97 70 - 99 mg/dl  POCT glycosylated hemoglobin (Hb A1C)  Result Value Ref Range   Hemoglobin A1C     HbA1c POC (<> result, manual entry)     HbA1c, POC (prediabetic range)     HbA1c, POC (controlled diabetic range) 6.2 0.0 - 7.0 %  A1c is at goal.  He is on Farxiga 10 mg daily. Weight fluctuates partially due to his fluid status.  Reports he is doing okay with eating habits.  COPD: Compliant with Spiriva and Symbicort.  Doing better with his breathing Decreased his smoking.  1 pk last 1 wk from 1 pk a day.  Smoked from age 38.  Does not want lung cancer screen.  HTN/CHF with reduced EF: EF has improved from 25% to 50% Saw cardiology Dr. Garen Lah 08/03/2022.  Blood pressure was elevated.  Had stopped the Bidil since last visit with me.  Restarted on BiDil 20/37.5 mg 3 times a day by cardiologist  (but taking only BID), continued carvedilol 6.25 mg twice a day and [place].  Plan is to recheck echo in 3 months. Patient with LVH with HCM, no LVOT.  Plan for genetic testing.  Zio patch worn.  Study revealed mainly SR with short run of V. tach.  Has f/u appt with Dr. Aundra Dubin 08/20/2022. Patient restarted on Eliquis for history of a flutter.  DCCV 06/2020. RHC done 07/13/2022 showed moderate pulmonary hypertension  Right renal mass: Had biopsy and microwave ablation 06/23/2022.  Pathology revealed papillary RCC. Pt states he was not told his  results and does not have f/u appt with urology  CKD 3: saw Dr. Candiss Norse last mth in [place] since last visit with me.  Reports being told that his kidney function is stable.  However the most recent GFR shows a drop from usual range of 40s to 28.  Having flare gout LT ankle Taking Allopurinol 200 mg daily; was on Colchicine but we had declined RF due to him being on Coreg.  Tells me that he sometimes takes the allopurinol 100 mg tablet 3 daily when he has a gout attack to see if it would alleviate it.  HM:  was called for eye exam but did not go.  Patient Active Problem List   Diagnosis Date Noted   Stage 3b chronic kidney disease ([place]) 08/09/2022   Hypertrophic cardiomyopathy ([place]) 08/09/2022   History of renal cell cancer 08/09/2022   Chronic heart failure with preserved ejection fraction (HFpEF) ([place]) 07/13/2022   Agitation 04/23/2022   RSV (respiratory syncytial virus pneumonia) 04/22/2022   Obesity (BMI 30-39.9) 04/21/2022   COPD exacerbation ([place]) 04/19/2022   Dyslipidemia 04/19/2022   Gout 04/19/2022   Right kidney mass 05/14/2021   CHF exacerbation ([place]) 04/14/2021   Chest pain 04/14/2021   Syncope 04/14/2021   Left-sided weakness 10/28/2020   Typical atrial  flutter ([place])    CHF (congestive heart failure) ([place]) 07/04/2020   Acute exacerbation of CHF (congestive heart failure) ([place]) 06/16/2020   Influenza vaccine refused 05/06/2020   Acute decompensated heart failure ([place]) 05/04/2020   Illiteracy 05/04/2020   Type 2 diabetes mellitus with stage 3 chronic kidney disease ([place]) 12/25/2019   Elevated troponin I level 10/26/2019   History of gout 02/01/2019   Seasonal allergic rhinitis due to pollen 02/01/2019   Tobacco dependence 11/30/2018   Microscopic hematuria 11/30/2018   Depression 11/30/2018   Difficulty controlling anger 11/30/2018   COPD (chronic obstructive pulmonary disease) (HCC)    CKD (chronic kidney disease) stage 3, GFR 30-59 ml/min ([place]) 08/10/2018    Recurrent epistaxis 04/21/2018   Mixed hyperlipidemia 07/28/2017   Essential hypertension [id number]   Chronic systolic heart failure ([place]) 10/25/2014   Cocaine abuse ([place]) 02/20/2013   Cannabis abuse 02/20/2013   Back pain, chronic 02/20/2013     Current Outpatient Medications on File Prior to Visit  Medication Sig Dispense Refill   Accu-Chek Softclix Lancets lancets Use as instructed 100 each 12   acetaminophen (TYLENOL) 500 MG tablet Take 500-1,000 mg by mouth every 6 (six) hours as needed (pain.).     albuterol (PROVENTIL) (2.5 MG/3ML) 0.083% nebulizer solution Take 3 mLs (2.5 mg total) by nebulization every 4 (four) hours as needed for wheezing or shortness of breath. 300 mL 0   albuterol (VENTOLIN HFA) 108 (90 Base) MCG/ACT inhaler Inhale 2 puffs into the lungs every 6 (six) hours as needed for wheezing or shortness of breath. 6.7 g 0   allopurinol (ZYLOPRIM) 100 MG tablet TAKE 2 TABLETS (200 MG TOTAL) BY MOUTH DAILY. 180 tablet 0   apixaban (ELIQUIS) 5 MG TABS tablet Take 1 tablet (5 mg total) by mouth 2 (two) times daily. 60 tablet 2   atorvastatin (LIPITOR) 40 MG tablet Take 1 tablet (40 mg total) by mouth daily. 90 tablet 3   Blood Glucose Monitoring Suppl (ACCU-CHEK GUIDE) w/Device KIT Use as directed 1 kit 0   budesonide-formoterol (SYMBICORT) 160-4.5 MCG/ACT inhaler Inhale 2 puffs into the lungs 2 (two) times daily. 10.2 g 0   carvedilol (COREG) 6.25 MG tablet Take 1 tablet (6.25 mg total) by mouth 2 (two) times daily. 180 tablet 3   dapagliflozin propanediol (FARXIGA) 10 MG TABS tablet Take 1 tablet (10 mg total) by mouth daily. 90 tablet 3   DULoxetine (CYMBALTA) 20 MG capsule Take 1 capsule (20 mg total) by mouth daily. 30 capsule 3   glucose blood (ACCU-CHEK GUIDE) test strip Use as directed to check blood sugar 1-2 times a day 100 each 12   isosorbide-hydrALAZINE (BIDIL) 20-37.5 MG tablet Take 1 tablet by mouth 3 (three) times daily. 270 tablet 3   methylPREDNISolone  (MEDROL DOSEPAK) 4 MG TBPK tablet Use as directed 21 tablet 0   montelukast (SINGULAIR) 10 MG tablet TAKE 1 TABLET (10 MG TOTAL) BY MOUTH AT BEDTIME. 30 tablet 0   oxyCODONE-acetaminophen (PERCOCET) 5-325 MG tablet Take 1 tablet by mouth every 4 (four) hours as needed for severe pain. 12 tablet 0   potassium chloride (KLOR-CON M) 10 MEQ tablet Take 2 tablets (20 mEq total) by mouth daily. 180 tablet 3   Tiotropium Bromide Monohydrate (SPIRIVA RESPIMAT) 2.5 MCG/ACT AERS Inhale 2 puffs into the lungs daily. 4 g 0   torsemide (DEMADEX) 20 MG tablet Take by mouth daily. Take '40mg'$  every other day alternating with '20mg'$  every other day.     [DISCONTINUED]  metoprolol tartrate (LOPRESSOR) 100 MG tablet Take 1 tablet (100 mg total) by mouth 2 (two) times daily. 60 tablet 0   No current facility-administered medications on file prior to visit.    No Known Allergies  Social History   Socioeconomic History   Marital status: Divorced    Spouse name: Not on file   Number of children: 3   Years of education: Not on file   Highest education level: High school graduate  Occupational History   Occupation: disability  Tobacco Use   Smoking status: Every Day    Packs/day: 1.00    Years: 43.00    Total pack years: 43.00    Types: Cigarettes   Smokeless tobacco: Never   Tobacco comments:        4 cigs daily--05/12/2022  Vaping Use   Vaping Use: Never used  Substance and Sexual Activity   Alcohol use: No   Drug use: Yes    Frequency: 21.0 times per week    Types: Marijuana, Cocaine    Comment: last use Cocaine- 03/28/2021. Still using marijuana, last use 06/22/21   Sexual activity: Not on file  Other Topics Concern   Not on file  Social History Narrative   ** Merged History Encounter **       Social Determinants of Health   Financial Resource Strain: Medium Risk (07/23/2022)   Overall Financial Resource Strain (CARDIA)    Difficulty of Paying Living Expenses: Somewhat hard  Food Insecurity:  No Food Insecurity (04/20/2022)   Hunger Vital Sign    Worried About Running Out of Food in the Last Year: Never true    Ran Out of Food in the Last Year: Never true  Transportation Needs: Unmet Transportation Needs (07/23/2022)   PRAPARE - Hydrologist (Medical): Yes    Lack of Transportation (Non-Medical): Yes  Physical Activity: Insufficiently Active (11/16/2021)   Exercise Vital Sign    Days of Exercise per Week: 5 days    Minutes of Exercise per Session: 20 min  Stress: Stress Concern Present (07/27/2022)   [place]    Feeling of Stress : Very much  Social Connections: Moderately Isolated (07/23/2022)   Social Connection and Isolation Panel [NHANES]    Frequency of Communication with Friends and Family: Twice a week    Frequency of Social Gatherings with Friends and Family: Once a week    Attends Religious Services: More than 4 times per year    Active Member of [employer] or Organizations: No    Attends Archivist Meetings: Never    Marital Status: Divorced  Human resources officer Violence: Not At Risk (04/20/2022)   Humiliation, Afraid, Rape, and Kick questionnaire    Fear of Current or Ex-Partner: No    Emotionally Abused: No    Physically Abused: No    Sexually Abused: No    Family History  Problem Relation Age of Onset   Heart disease Father    Diabetes Mother    HIV Brother    Healthy Son    Healthy Daughter     Past Surgical History:  Procedure Laterality Date   ANKLE SURGERY     CARDIAC CATHETERIZATION     CARDIOVERSION N/A 07/08/2020   Procedure: CARDIOVERSION;  Surgeon: Corey Skains, MD;  Location: [facility];  Service: Cardiovascular;  Laterality: N/A;   COLONOSCOPY WITH PROPOFOL N/A 11/19/2021   Procedure: COLONOSCOPY WITH PROPOFOL;  Surgeon: Daryel November,  MD;  Location: [facility];  Service: Gastroenterology;  Laterality: N/A;   HERNIA REPAIR     x2   IR  RADIOLOGIST EVAL & MGMT  05/13/2022   POLYPECTOMY  11/19/2021   Procedure: POLYPECTOMY;  Surgeon: Daryel November, MD;  Location: [facility];  Service: Gastroenterology;;   RADIOLOGY WITH ANESTHESIA N/A 06/23/2022   Procedure: Right renal tumor ablation;  Surgeon: Suzette Battiest, MD;  Location: [facility];  Service: Radiology;  Laterality: N/A;   RIGHT HEART CATH N/A 07/13/2022   Procedure: RIGHT HEART CATH;  Surgeon: [name], MD;  Location: [facility];  Service: Cardiovascular;  Laterality: N/A;   SHOULDER SURGERY     TEE WITHOUT CARDIOVERSION N/A 07/08/2020   Procedure: TRANSESOPHAGEAL ECHOCARDIOGRAM (TEE);  Surgeon: Corey Skains, MD;  Location: [facility];  Service: Cardiovascular;  Laterality: N/A;    ROS: Review of Systems Negative except as stated above  PHYSICAL EXAM: BP 131/74 (BP Location: Left Arm, Patient Position: Sitting, Cuff Size: Normal)   Pulse 82   Temp 97.7 F (36.5 C) (Oral)   Ht 6' (1.829 m)   Wt 255 lb (115.7 kg)   SpO2 99%   BMI 34.58 kg/m   Wt Readings from Last 3 Encounters:  08/09/22 255 lb (115.7 kg)  08/03/22 253 lb (114.8 kg)  07/28/22 253 lb 4 oz (114.9 kg)    Physical Exam   General appearance - alert, well appearing, and in no distress Mental status - normal mood, behavior, speech, dress, motor activity, and thought processes Chest - clear to auscultation, no wheezes, rales or rhonchi, symmetric air entry Heart - normal rate, regular rhythm, normal S1, S2, no murmurs, rubs, clicks or gallops Musculoskeletal -left ankle: Mild edema.  No erythema.  Mild discomfort on passive range of motion. Extremities -no edema in the lower legs except as mentioned above.     Latest Ref Rng & Units 07/20/2022    8:24 AM 07/13/2022   11:05 AM 07/09/2022    1:12 PM  CMP  Glucose 70 - 99 mg/dL 96   95   BUN 6 - 20 mg/dL 49   19   Creatinine 0.61 - 1.24 mg/dL 2.54   1.86   Sodium 135 - 145 mmol/L 137  141    141  138   Potassium 3.5 - 5.1  mmol/L 4.0  4.4    4.3  4.1   Chloride 98 - 111 mmol/L 103   103   CO2 22 - 32 mmol/L 22   26   Calcium 8.9 - 10.3 mg/dL 8.5   9.1   Total Protein 6.5 - 8.1 g/dL 7.4     Total Bilirubin 0.3 - 1.2 mg/dL 0.5     Alkaline Phos 38 - 126 U/L 82     AST 15 - 41 U/L 15     ALT 0 - 44 U/L 13      Lipid Panel     Component Value Date/Time   CHOL 176 12/25/2019 0632   CHOL 160 11/07/2019 0918   CHOL 157 10/21/2013 0435   TRIG 91 12/25/2019 0632   TRIG 117 10/21/2013 0435   HDL 37 (L) 12/25/2019 0632   HDL 40 11/07/2019 0918   HDL 30 (L) 10/21/2013 0435   CHOLHDL 4.8 12/25/2019 0632   VLDL 18 12/25/2019 0632   VLDL 23 10/21/2013 0435   LDLCALC 121 (H) 12/25/2019 0632   LDLCALC 92 11/07/2019 0918   LDLCALC 104 (H) 10/21/2013  0435    CBC    Component Value Date/Time   WBC 12.6 (H) 07/20/2022 0855   RBC 4.85 07/20/2022 0855   HGB 14.2 07/20/2022 0855   HGB 15.4 11/07/2019 0918   HCT 44.3 07/20/2022 0855   HCT 46.2 11/07/2019 0918   PLT 236 07/20/2022 0855   PLT 245 11/07/2019 0918   MCV 91.3 07/20/2022 0855   MCV 90 11/07/2019 0918   MCV 92 10/21/2013 0435   MCH 29.3 07/20/2022 0855   MCHC 32.1 07/20/2022 0855   RDW 14.5 07/20/2022 0855   RDW 14.2 11/07/2019 0918   RDW 15.7 (H) 10/21/2013 0435   LYMPHSABS 1.8 06/29/2022 0948   LYMPHSABS 1.1 10/21/2013 0435   MONOABS 0.7 06/29/2022 0948   MONOABS 0.6 10/21/2013 0435   EOSABS 0.1 06/29/2022 0948   EOSABS 0.0 10/21/2013 0435   BASOSABS 0.1 06/29/2022 0948   BASOSABS 0.0 10/21/2013 0435    ASSESSMENT AND PLAN:  1. Type 2 diabetes mellitus with obesity (HCC) A1c is at goal.  Continue Farxiga.  Encouraged him to continue healthy eating habits - POCT glucose (manual entry) - POCT glycosylated hemoglobin (Hb A1C) - Ambulatory referral to Ophthalmology - Microalbumin / creatinine urine ratio  2. CKD (chronic kidney disease) stage 4, GFR 15-29 ml/min (HCC) GFR has decreased on last chemistry compared to his previous  levels.  Only thing that has changed is that he had microwave ablation on right kidney lesion 06/23/2022.  Continue to avoid NSAIDs. - Basic Metabolic Panel  3. Hypertension associated with diabetes ([place]) Close to goal.  Continue carvedilol 6.25 mg twice a day, BiDil which I encouraged him to take 3 times a day as prescribed  4. Pulmonary emphysema, unspecified emphysema type (HCC) Continue Spiriva and Symbicort  5. History of renal cell cancer Now that he has had microwave ablation of right kidney lesion that turned out to be papillary RCC, will have him follow-up with urology - Ambulatory referral to Urology  6. Hypertrophic cardiomyopathy ([place]) Followed by cardiology.  Patient states he has had the gene testing done but I do not see the results in the system.  7. VT (ventricular tachycardia) (HCC) Runs of VT seen on recent ZIO.  He has upcoming appointment with cardiology next month  8. Acute idiopathic gout of left ankle Will give a tapering prednisone course.  Advised patient that we can no longer use colchicine daily given his kidney function and the fact that he is on carvedilol.  Recommend that we use half a tablet 3 times a week - colchicine 0.6 MG tablet; Take 0.5 tablets (0.3 mg total) by mouth three times a week.  Dispense: 15 tablet; Refill: 1 - predniSONE (DELTASONE) 20 MG tablet; Take 1 tablet (20 mg total) by mouth daily for 6 days, THEN 0.5 tablets (10 mg total) daily for 6 days.  Dispense: 9 tablet; Refill: 0  9. History of atrial flutter Patient back on Eliquis.  Cardiology to determine whether he still needs to be on this given that recent Zio showed most of the times he was in sinus rhythm. - CBC  10.  Patient declined lung cancer screening  Patient was given the opportunity to ask questions.  Patient verbalized understanding of the plan and was able to repeat key elements of the plan.   This documentation was completed using Radio producer.   Any transcriptional errors are unintentional.  Orders Placed This Encounter  Procedures   Microalbumin / creatinine urine ratio  Basic Metabolic Panel   CBC   Ambulatory referral to Ophthalmology   Ambulatory referral to Urology   POCT glucose (manual entry)   POCT glycosylated hemoglobin (Hb A1C)     Requested Prescriptions   Signed Prescriptions Disp Refills   colchicine 0.6 MG tablet 15 tablet 1    Sig: Take 0.5 tablets (0.3 mg total) by mouth three times a week.   predniSONE (DELTASONE) 20 MG tablet 9 tablet 0    Sig: Take 1 tablet (20 mg total) by mouth daily for 6 days, THEN 0.5 tablets (10 mg total) daily for 6 days.    Return in about 4 months (around 12/08/2022).  Karle Plumber, MD, FACP

## 2022-08-09 NOTE — Telephone Encounter (Signed)
error 

## 2022-08-09 NOTE — Telephone Encounter (Signed)
Patient aware of Zio results

## 2022-08-10 ENCOUNTER — Other Ambulatory Visit: Payer: Self-pay

## 2022-08-10 LAB — MICROALBUMIN / CREATININE URINE RATIO
Creatinine, Urine: 27.3 mg/dL
Microalb/Creat Ratio: 193 mg/g creat — ABNORMAL HIGH (ref 0–29)
Microalbumin, Urine: 52.8 ug/mL

## 2022-08-18 ENCOUNTER — Telehealth: Payer: Self-pay

## 2022-08-18 NOTE — Patient Outreach (Signed)
[name] receieved a message from [facility] stating patient contacted her with an urgent need. [name] contacted patient, he stated he is in a lot of pain in his back, legs side and hip. Patient also wanted [facility]/[name] to schedule his transportation for his appointment on Friday, [name] encouraged patint to contact [employer] to get transportation scheduled due to [employer] scheduling his last 2. Patient agreed and stated he would contact and schedule today.  [name], [name], [place] Managed Medicaid Team  [phone]

## 2022-08-19 ENCOUNTER — Other Ambulatory Visit: Payer: Medicaid Other | Admitting: Licensed Clinical Social Worker

## 2022-08-19 NOTE — Patient Outreach (Signed)
Medicaid Managed Care Social Work Note  08/19/2022 Name:  Chris [name] MRN:  [id number] DOB:  [birth year]  [name] is an 62 y.o. year old male who is a primary patient of [name], MD.  The Medicaid Managed Care Coordination team was consulted for assistance with:  [place] and Resources  [name] was given information about Medicaid Managed Care Coordination team services today. [name] Patient agreed to services and verbal consent obtained.  Engaged with patient  for by telephone forfollow up visit in response to referral for case management and/or care coordination services.   Assessments/Interventions:  Review of past medical history, allergies, medications, health status, including review of consultants reports, laboratory and other test data, was performed as part of comprehensive evaluation and provision of chronic care management services.  SDOH: (Social Determinant of Health) assessments and interventions performed: SDOH Interventions    Flowsheet Row Patient Outreach Telephone from 08/19/2022 in [place] Patient Outreach Telephone from 07/27/2022 in [place] Patient Outreach Telephone from 07/23/2022 in [place] Patient Outreach Telephone from 07/08/2022 in [place] ED to Hosp-Admission (Discharged) from 04/19/2022 in [place] [facility] Patient Outreach Telephone from 11/16/2021 in [place] Coordination  SDOH Interventions        Housing Interventions -- -- Intervention Not Indicated -- -- --  Transportation Interventions -- -- Other (Comment)  Dispensing optician arranged by [place] for upcoming appointments] -- Patient Resources Tax adviser) --  Utilities Interventions -- -- Other (Comment)  [MM Social WOrk team consulted] -- -- --  Depression  Interventions/Treatment  -- -- -- Referral to Psychiatry -- --  Financial Strain Interventions -- -- Other (Comment)  [MM Social Work Support] -- -- --  Physical Activity Interventions -- -- -- -- -- Intervention Not Indicated  Stress Interventions Offered [name], Provide Counseling Provide Counseling, [place] Resources, Provide Counseling -- --  Social Connections Interventions -- -- Other (Comment)  [SW team referral] -- -- --       Advanced Directives Status:  See Care Plan for related entries.  Care Plan                 No Known Allergies  Medications Reviewed Today     Reviewed by [name], MD (Physician) on 08/09/22 at [place] [facility] Status: <None>   Medication Order Taking? Sig Documenting Provider Last Dose Status Informant  Accu-Chek Softclix Lancets lancets [id number] Yes Use as instructed [name], MD Taking Active Self  acetaminophen (TYLENOL) 500 MG tablet [id number] Yes Take 500-1,000 mg by mouth every 6 (six) hours as needed (pain.). [provider] Taking Active Self  albuterol (PROVENTIL) (2.5 MG/3ML) 0.083% nebulizer solution [id number] Yes Take 3 mLs (2.5 mg total) by nebulization every 4 (four) hours as needed for wheezing or shortness of breath. [name], MD Taking Active Self  albuterol (VENTOLIN HFA) 108 (90 Base) MCG/ACT inhaler [id number] Yes Inhale 2 puffs into the lungs every 6 (six) hours as needed for wheezing or shortness of breath. [name], MD Taking Active Self  allopurinol (ZYLOPRIM) 100 MG tablet [id number] Yes TAKE 2 TABLETS (200 MG TOTAL) BY MOUTH DAILY. [name], MD Taking Active Self  apixaban (ELIQUIS) 5 MG TABS tablet [id number] Yes Take 1 tablet (5 mg total) by mouth 2 (two) times  daily. [name], MD Taking Active            Med Note Thamas Jaegers, MELANIE A   Thu Aug 05, 2022 10:23 AM) Needs to pick up from pharmacy   atorvastatin (LIPITOR) 40 MG tablet [id number] Yes Take 1 tablet (40 mg total) by mouth daily. Alisa Graff, FNP Taking Active Self  Blood Glucose Monitoring Suppl (ACCU-CHEK GUIDE) w/Device [name] [id number] Yes Use as directed [name], MD Taking Active Self  budesonide-formoterol [facility]) 160-4.5 MCG/ACT inhaler [id number] Yes Inhale 2 puffs into the lungs 2 (two) times daily. [name], MD Taking Active Self  carvedilol (COREG) 6.25 MG tablet [id number] Yes Take 1 tablet (6.25 mg total) by mouth 2 (two) times daily. Alisa Graff, FNP Taking Active Self  colchicine 0.6 MG tablet [id number] Yes Take 0.5 tablets (0.3 mg total) by mouth three times a week. [name], MD  Active   dapagliflozin propanediol (FARXIGA) 10 MG TABS tablet [id number] Yes Take 1 tablet (10 mg total) by mouth daily. Alisa Graff, FNP Taking Active Self  DULoxetine (CYMBALTA) 20 MG capsule [id number] Yes Take 1 capsule (20 mg total) by mouth daily. [name], MD Taking Active Self           Med Note [name], [name]   Thu Jun 17, 2022  3:57 PM)    glucose blood (ACCU-CHEK GUIDE) test strip [id number] Yes Use as directed to check blood sugar 1-2 times a day [name], MD Taking Active Self  isosorbide-hydrALAZINE (BIDIL) 20-37.5 MG tablet [id number] Yes Take 1 tablet by mouth 3 (three) times daily. [name], MD Taking Active   methylPREDNISolone (MEDROL DOSEPAK) 4 MG TBPK tablet [id number] Yes Use as directed [name], PA-C Taking Active Self           Med Note Clerance Lav   Fri Jul 23, 2022  2:15 PM) Patient reports he completed the dose pack as prescribed    Discontinued 07/11/20 0943 (Discontinued by provider)   montelukast (SINGULAIR) 10 MG tablet [id number] Yes TAKE 1 TABLET (10 MG TOTAL) BY MOUTH AT BEDTIME. [name], MD Taking Active Self  oxyCODONE-acetaminophen (PERCOCET) 5-325 MG tablet [id number] Yes Take 1 tablet by mouth every 4 (four) hours  as needed for severe pain. Harvest Dark, MD Taking Active   potassium chloride (KLOR-CON M) 10 MEQ tablet [id number] Yes Take 2 tablets (20 mEq total) by mouth daily. [name], MD Taking Active   predniSONE (DELTASONE) 20 MG tablet [id number] Yes Take 1 tablet (20 mg total) by mouth daily for 6 days, THEN 0.5 tablets (10 mg total) daily for 6 days. [name], MD  Active   Tiotropium Bromide Monohydrate (SPIRIVA RESPIMAT) 2.5 MCG/ACT AERS [id number] Yes Inhale 2 puffs into the lungs daily. [name], MD Taking Active Self  torsemide (DEMADEX) 20 MG tablet [id number] Yes Take by mouth daily. Take '40mg'$  every other day alternating with '20mg'$  every other day. [provider] Taking Active             Patient Active Problem List   Diagnosis Date Noted   Stage 3b chronic kidney disease ([place]) 08/09/2022   Hypertrophic cardiomyopathy ([place]) 08/09/2022   History of renal cell cancer 08/09/2022   Chronic heart failure with preserved ejection fraction (HFpEF) ([place]) 07/13/2022   Agitation 04/23/2022   RSV (respiratory syncytial virus pneumonia) 04/22/2022   Obesity (BMI 30-39.9) 04/21/2022   COPD exacerbation ([place]) 04/19/2022   Dyslipidemia 04/19/2022  Gout 04/19/2022   Right kidney mass 05/14/2021   CHF exacerbation ([place]) 04/14/2021   Chest pain 04/14/2021   Syncope 04/14/2021   Left-sided weakness 10/28/2020   Typical atrial flutter (HCC)    CHF (congestive heart failure) ([place]) 07/04/2020   Acute exacerbation of CHF (congestive heart failure) ([place]) 06/16/2020   Influenza vaccine refused 05/06/2020   Acute decompensated heart failure ([place]) 05/04/2020   Illiteracy 05/04/2020   Type 2 diabetes mellitus with stage 3 chronic kidney disease ([place]) 12/25/2019   Elevated troponin I level 10/26/2019   History of gout 02/01/2019   Seasonal allergic rhinitis due to pollen 02/01/2019   Tobacco dependence 11/30/2018   Microscopic hematuria 11/30/2018   Depression  11/30/2018   Difficulty controlling anger 11/30/2018   COPD (chronic obstructive pulmonary disease) (HCC)    CKD (chronic kidney disease) stage 3, GFR 30-59 ml/min (HCC) 08/10/2018   Recurrent epistaxis 04/21/2018   Mixed hyperlipidemia 07/28/2017   Essential hypertension [id number]   Chronic systolic heart failure ([place]) 10/25/2014   Cocaine abuse ([place]) 02/20/2013   Cannabis abuse 02/20/2013   Back pain, chronic 02/20/2013    Conditions to be addressed/monitored per PCP order:  Depression  Care Plan : General Social Work (Adult)  Updates made by Greg Cutter, LCSW since 08/19/2022 12:00 AM     Problem: Depression Identification (Depression)      Long-Range Goal: To increase my self care and gain mental health support   Start Date: 07/08/2022  Priority: High  Note:   Timeframe:  Long-Range Goal Priority:  High Start Date:   07/08/22                    Expected End Date:ongoing    Follow Up Date- 08/27/22 at 3 pm   - begin personal counseling - call and visit an old friend - check out volunteer opportunities - join a support group - laugh; watch a funny movie or comedian - learn and use visualization or guided imagery - perform a random act of kindness - practice relaxation or meditation daily - start or continue a personal journal - talk about feelings with a friend, family or spiritual advisor - practice positive thinking and self-talk    Why is this important?   When you are stressed, down or upset, your body reacts too.  For example, your blood pressure may get higher; you may have a headache or stomachache.  When your emotions get the best of you, your body's ability to fight off cold and flu gets weak.  These steps will help you manage your emotions.   Current Barriers:  Chronic Mental Health needs related to depression and need for housing Limited social support, Housing barriers, and Mental Health Concerns  Social Isolation ADL IADL limitations Suicidal  Ideation/Homicidal Ideation: No  Clinical Social Work Goal(s):  Over the next 120 days, patient will work with LCSW monthly by telephone or in person to reduce or manage symptoms related to depression and relapse prevention patient will work with [name] to address needs related to financial, food and transportation support. Patient wishes to offset medical and housing expenses with financial support as housing resources are very limited at this time  Interventions: Patient interviewed and appropriate assessments performed: brief mental health assessment Discussed plans with patient for ongoing care management follow up and provided patient with direct contact information for care management team Assisted patient/caregiver with obtaining information about health plan benefits Patient was educated on the Advance Directives process. Patient  was encouraged to complete document with PCP.  Advance Directive education was provided to patient again on 07/08/22 and 08/19/22 Encouraged patient to consider a mental health provider for long term follow up and therapy/counseling and patient is now agreeable. Referral made to [facility] for both counseling and psychiatry.  Patient reports recent agitation due to his inability to meet his daily mental and physical needs. [facility] LCSW provided emotional support and reflective listening.  Solution-Focused Strategies, Mindfulness or Relaxation Training, Active listening / Reflection utilized , Emotional Supportive Provided, and Verbalization of feelings encouraged  Patient declines any current substance use. Patient has a history of cocaine abuse. Relapse prevention education provided.  Patient reports that he has developed social anxiety. He admits that he gets irritable at times and will snap at his loved ones and then will experience guilt afterwards. Educated patient on coping methods to implement into his daily life to combat anxiety symptoms and stress. Patient denied any  current suicidal or homicidal ideations. Encouraged patient to implement deep breathing and grounding exercises into his daily routine due to ongoing anxiety and SOB.  Patient reports that he will start implementing appropriate self-care habits into his daily routine such as: drinking water, staying active around the house, taking his medications as prescribed, combating negative thoughts or emotions and staying connected with his support network. Positive reinforcement provided.  [place] LCSW made referral for [facility] [name] and [facility] RNCM involvement. Message sent to entire team by [facility] LCSW. 07/27/22 LCSW update- Patient and [facility] LCSW contacted [facility] together and scheduled him for both psychiatry and counseling next month. Patient's counseling is virtual so he will not need transportation assistance for this appointment only for psychiatry. [place] LCSW will update [place] [name]. [facility] LCSW had patient physically write down his appointment reminders as he is having issues with his memory. Self-care education provided. [facility] LCSW 08/19/22 update- Patient and [facility] LCSW contacted [facility] together and left a message regarding his upcoming appointments as he has scheduled transportation for BOTH appointments even though of one those appointments is virtual. However, he has not filled out their intake paperwork and will need a ride to do this in person. [facility] LCSW encouraged patient to keep trying to reach [facility] in order to gain appropriate instructions on what to expect during his first visit.  09/15/21: [name] completed telephone outreach with patient for utility assistance. Patient stated that he has a cut off notice for 09/25/21 and his bill is $700.00. [name] informed patient he can go to the [place] and can also [employer] [name] provided patient with the telephone number for [employer]. Patient stated no other resources are needed at this time.   Patient Self Care Activities:  Ability for  insight Agreement to start mental health treatment and to the self-care journey   Patient Coping Strengths:  Self Advocate Able to Communicate Effectively  Patient Self Care Deficits:  Lacks social connections  The following coping skill education was provided for stress relief and mental health management: "When your car dies or a deadline looms, how do you respond? Long-term, low-grade or acute stress takes a serious toll on your body and mind, so don't ignore feelings of constant tension. Stress is a natural part of life. However, too much stress can harm our health, especially if it continues every day. This is chronic stress and can put you at risk for heart problems like heart disease and depression. Understand what's happening inside your body and learn simple  coping skills to combat the negative impacts of everyday stressors.  Types of Stress There are two types of stress: Emotional - types of emotional stress are relationship problems, pressure at work, financial worries, experiencing discrimination or having a major life change. Physical - Examples of physical stress include being sick having pain, not sleeping well, recovery from an injury or having an alcohol and drug use disorder. Fight or Flight Sudden or ongoing stress activates your nervous system and floods your bloodstream with adrenaline and cortisol, two hormones that raise blood pressure, increase heart rate and spike blood sugar. These changes pitch your body into a fight or flight response. That enabled our ancestors to outrun saber-toothed tigers, and it's helpful today for situations like dodging a car accident. But most modern chronic stressors, such as finances or a challenging relationship, keep your body in that heightened state, which hurts your health. Effects of Too Much Stress If constantly under stress, most of [place] will eventually start to function less well.  Multiple studies link chronic stress to a higher risk  of heart disease, stroke, depression, weight gain, memory loss and even premature death, so it's important to recognize the warning signals. Talk to your doctor about ways to manage stress if you're experiencing any of these symptoms: Prolonged periods of poor sleep. Regular, severe headaches. Unexplained weight loss or gain. Feelings of isolation, withdrawal or worthlessness. Constant anger and irritability. Loss of interest in activities. Constant worrying or obsessive thinking. Excessive alcohol or drug use. Inability to concentrate.  10 Ways to Cope with Chronic Stress It's key to recognize stressful situations as they occur because it allows you to focus on managing how you react. We all need to know when to close our eyes and take a deep breath when we feel tension rising. Use these tips to prevent or reduce chronic stress. 1. Rebalance Work and Home All work and no play? If you're spending too much time at the office, intentionally put more dates in your calendar to enjoy time for fun, either alone or with others. 2. Get Regular Exercise Moving your body on a regular basis balances the nervous system and increases blood circulation, helping to flush out stress hormones. Even a daily 20-minute walk makes a difference. Any kind of exercise can lower stress and improve your mood ? just pick activities that you enjoy and make it a regular habit. 3. Eat Well and Limit Alcohol and Stimulants Alcohol, nicotine and caffeine may temporarily relieve stress but have negative health impacts and can make stress worse in the long run. Well-nourished bodies cope better, so start with a good breakfast, add more organic fruits and vegetables for a well-balanced diet, avoid processed foods and sugar, try herbal tea and drink more water. 4. Connect with Supportive People Talking face to face with another person releases hormones that reduce stress. Lean on those good listeners in your life. 5. [place] Time Do you enjoy gardening, reading, listening to music or some other creative pursuit? Engage in activities that bring you pleasure and joy; research shows that reduces stress by almost half and lowers your heart rate, too. 6. Practice Meditation, Stress Reduction or Yoga Relaxation techniques activate a state of restfulness that counterbalances your body's fight-or-flight hormones. Even if this also means a 10-minute break in a long day: listen to music, read, go for a walk in nature, do a hobby, take a bath or spend time with a friend. Also consider doing a mindfulness exercise  or try a daily deep breathing or imagery practice. Deep Breathing Slow, calm and deep breathing can help you relax. Try these steps to focus on your breathing and repeat as needed. Find a comfortable position and close your eyes. Exhale and drop your shoulders. Breathe in through your nose; fill your lungs and then your belly. Think of relaxing your body, quieting your mind and becoming calm and peaceful. Breathe out slowly through your nose, relaxing your belly. Think of releasing tension, pain, worries or distress. Repeat steps three and four until you feel relaxed. Imagery This involves using your mind to excite the senses -- sound, vision, smell, taste and feeling. This may help ease your stress. Begin by getting comfortable and then do some slow breathing. Imagine a place you love being at. It could be somewhere from your childhood, somewhere you vacationed or just a place in your imagination. Feel how it is to be in the place you're imagining. Pay attention to the sounds, air, colors, and who is there with you. This is a place where you feel cared for and loved. All is well. You are safe. Take in all the smells, sounds, tastes and feelings. As you do, feel your body being nourished and healed. Feel the calm that surrounds you. Breathe in all the good. Breathe out any discomfort or tension. 7. Sleep  Enough If you get less than seven to eight hours of sleep, your body won't tolerate stress as well as it could. If stress keeps you up at night, address the cause, and add extra meditation into your day to make up for the lost z's. Try to get seven to nine hours of sleep each night. Make a regular bedtime schedule. Keep your room dark and cool. Try to avoid computers, TV, cell phones and tablets before bed. 8. Bond with Connections You Enjoy Go out for a coffee with a friend, chat with a neighbor, call a family member, visit with a clergy member, or even hang out with your pet. Clinical studies show that spending even a short time with a companion animal can cut anxiety levels almost in half. 9. Take a Vacation Getting away from it all can reset your stress tolerance by increasing your mental and emotional outlook, which makes you a happier, more productive person upon return. Leave your cellphone and laptop at home! 10. See a Counselor, Coach or Therapist If negative thoughts overwhelm your ability to make positive changes, it's time to seek professional help. Make an appointment today--your health and life are worth it."     24- Hour Availability:    [facility]  [address] [place], [place] [facility] [phone]   [facility] [employer] [phone]   [place]  [phone]    [place]  [phone] (after hours)   Therapeutic Alternative/Mobile Crisis   [phone]   [place] National Suicide Hotline  [phone] [address]) [place] 988   Call 911 or go to emergency room   [facility]  [phone]);  [place] and [employer]  [phone]); Lilla Shook, [place], [place], [place], [place], [place]            07/08/2022   10:52 AM 05/11/2022    4:17 PM 12/21/2021   11:44 AM 08/20/2021    9:57 AM 05/14/2021   12:36 PM  Depression screen PHQ 2/9   Decreased Interest '2 1 2 '$ 0 0  Down, Depressed,  Hopeless '2 1 1 '$ 0 1  PHQ - 2 Score '4 2 3 '$ 0 1  Altered sleeping '3 3 3  3  '$ Tired, decreased energy '3 3 2  1  '$ Change in appetite 2 2 0  0  Feeling bad or failure about yourself  2 2 0  0  Trouble concentrating '2 2 1  '$ 0  Moving slowly or fidgety/restless 0 0 1  0  Suicidal thoughts 0 0 0  0  PHQ-9 Score '16 14 10  5  '$ Difficult doing work/chores Very difficult      Follow up goal      Follow up:  Patient agrees to Care Plan and Follow-up.  Plan: The Managed Medicaid care management team will reach out to the patient again over the next 30 days.  Date/time of next scheduled Social Work care management/care coordination outreach:  08/27/22 at 3 pm.  Eula Fried, [place], MSW, [place] Medicaid LCSW [email or web address] Phone: [phone]

## 2022-08-19 NOTE — Patient Instructions (Signed)
Visit Information  Mr. Reichmann was given information about Medicaid Managed Care team care coordination services as a part of their Healthy [facility] Medicaid benefit. DELMO RADAKOVICH verbally consented to engagement with the [facility] Managed Care team.   If you are experiencing a medical emergency, please call 911 or report to your local emergency department or urgent care.   If you have a non-emergency medical problem during routine business hours, please contact your provider's office and ask to speak with a nurse.   For questions related to your Healthy [facility] health plan, please call: [phone] or visit the homepage here: [email or web address]  If you would like to schedule transportation through your Healthy [facility] plan, please call the following number at least 2 days in advance of your appointment: [phone]  For information about your ride after you set it up, call Ride Assist at [phone]. Use this number to activate a Will Call pickup, or if your transportation is late for a scheduled pickup. Use this number, too, if you need to make a change or cancel a previously scheduled reservation.  If you need transportation services right away, call [phone]. The after-hours call center is staffed 24 hours to handle ride assistance and urgent reservation requests (including discharges) 365 days a year. Urgent trips include sick visits, hospital discharge requests and life-sustaining treatment.  Call the [place] at [phone], at any time, 24 hours a day, 7 days a week. If you are in danger or need immediate medical attention call 911.  If you would like help to quit smoking, call 1-800-QUIT-NOW 270-479-8540) OR Espaol: 1-855-Djelo-Ya HD:1601594) o para ms informacin haga clic aqu or Text READY to 200-400 to register via text

## 2022-08-20 ENCOUNTER — Encounter: Payer: Medicaid Other | Admitting: Cardiology

## 2022-08-23 ENCOUNTER — Encounter: Payer: Medicaid Other | Admitting: Family

## 2022-08-23 ENCOUNTER — Telehealth (HOSPITAL_COMMUNITY): Payer: Self-pay | Admitting: Mental Health

## 2022-08-23 ENCOUNTER — Ambulatory Visit (HOSPITAL_COMMUNITY): Payer: Medicaid Other | Admitting: Mental Health

## 2022-08-23 NOTE — Telephone Encounter (Signed)
Therapist sent link for tele-assessment. No response after x 10 minutes, with x 2 links sent. Therapist contacted pt and educated on how to connect to virtual appointment. Pt continued to have difficulty; therapist reschedule for in person appointment.

## 2022-08-26 ENCOUNTER — Ambulatory Visit: Payer: Medicaid Other | Attending: Cardiology | Admitting: Family

## 2022-08-26 ENCOUNTER — Ambulatory Visit (INDEPENDENT_AMBULATORY_CARE_PROVIDER_SITE_OTHER): Payer: Medicaid Other | Admitting: Student

## 2022-08-26 ENCOUNTER — Other Ambulatory Visit: Payer: Self-pay

## 2022-08-26 ENCOUNTER — Encounter: Payer: Self-pay | Admitting: Family

## 2022-08-26 VITALS — BP 150/100 | HR 73 | Wt 255.0 lb

## 2022-08-26 DIAGNOSIS — I5032 Chronic diastolic (congestive) heart failure: Secondary | ICD-10-CM | POA: Diagnosis not present

## 2022-08-26 DIAGNOSIS — F331 Major depressive disorder, recurrent, moderate: Secondary | ICD-10-CM | POA: Diagnosis not present

## 2022-08-26 DIAGNOSIS — I472 Ventricular tachycardia, unspecified: Secondary | ICD-10-CM | POA: Diagnosis not present

## 2022-08-26 DIAGNOSIS — F320 Major depressive disorder, single episode, mild: Secondary | ICD-10-CM

## 2022-08-26 DIAGNOSIS — F17210 Nicotine dependence, cigarettes, uncomplicated: Secondary | ICD-10-CM | POA: Diagnosis not present

## 2022-08-26 DIAGNOSIS — K219 Gastro-esophageal reflux disease without esophagitis: Secondary | ICD-10-CM | POA: Insufficient documentation

## 2022-08-26 DIAGNOSIS — L905 Scar conditions and fibrosis of skin: Secondary | ICD-10-CM | POA: Diagnosis not present

## 2022-08-26 DIAGNOSIS — I2721 Secondary pulmonary arterial hypertension: Secondary | ICD-10-CM | POA: Insufficient documentation

## 2022-08-26 DIAGNOSIS — G8929 Other chronic pain: Secondary | ICD-10-CM | POA: Diagnosis not present

## 2022-08-26 DIAGNOSIS — N189 Chronic kidney disease, unspecified: Secondary | ICD-10-CM | POA: Diagnosis not present

## 2022-08-26 DIAGNOSIS — F32A Depression, unspecified: Secondary | ICD-10-CM | POA: Insufficient documentation

## 2022-08-26 DIAGNOSIS — E1122 Type 2 diabetes mellitus with diabetic chronic kidney disease: Secondary | ICD-10-CM | POA: Diagnosis not present

## 2022-08-26 DIAGNOSIS — M109 Gout, unspecified: Secondary | ICD-10-CM | POA: Insufficient documentation

## 2022-08-26 DIAGNOSIS — M25511 Pain in right shoulder: Secondary | ICD-10-CM

## 2022-08-26 DIAGNOSIS — I4892 Unspecified atrial flutter: Secondary | ICD-10-CM | POA: Diagnosis not present

## 2022-08-26 DIAGNOSIS — I493 Ventricular premature depolarization: Secondary | ICD-10-CM | POA: Diagnosis not present

## 2022-08-26 DIAGNOSIS — I130 Hypertensive heart and chronic kidney disease with heart failure and stage 1 through stage 4 chronic kidney disease, or unspecified chronic kidney disease: Secondary | ICD-10-CM | POA: Insufficient documentation

## 2022-08-26 DIAGNOSIS — J449 Chronic obstructive pulmonary disease, unspecified: Secondary | ICD-10-CM

## 2022-08-26 DIAGNOSIS — F17200 Nicotine dependence, unspecified, uncomplicated: Secondary | ICD-10-CM

## 2022-08-26 DIAGNOSIS — I2510 Atherosclerotic heart disease of native coronary artery without angina pectoris: Secondary | ICD-10-CM | POA: Insufficient documentation

## 2022-08-26 DIAGNOSIS — I10 Essential (primary) hypertension: Secondary | ICD-10-CM

## 2022-08-26 MED ORDER — TRAZODONE HCL 50 MG PO TABS
50.0000 mg | ORAL_TABLET | Freq: Every day | ORAL | 1 refills | Status: DC
  Filled 2022-08-26: qty 60, 30d supply, fill #0

## 2022-08-26 MED ORDER — DULOXETINE HCL 20 MG PO CPEP
40.0000 mg | ORAL_CAPSULE | Freq: Every day | ORAL | 1 refills | Status: DC
  Filled 2022-08-26: qty 60, 30d supply, fill #0

## 2022-08-26 MED ORDER — DULOXETINE HCL 30 MG PO CPEP
30.0000 mg | ORAL_CAPSULE | Freq: Every day | ORAL | 1 refills | Status: DC
  Filled 2022-08-26: qty 30, 30d supply, fill #0

## 2022-08-26 NOTE — Progress Notes (Signed)
Patient ID: Chris [name], male    DOB: [birth year], 62 y.o.   MRN: [id number]  HPI  Mr Chris [name] is a 62 y/o male with a history of CAD, DM, HTN, CKD, gout, depression, atrial flutter, COPD, GERD, current tobacco use and chronic heart failure.   Echo 8/15//23: EF of 50% along with moderate LVH and mild/moderate LAE. EF much improved from previous 30-35%  RHC 07/13/22:  1. Elevated right and left heart filling pressures.  2. 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  3. Mildly low cardiac output.  4. Low PAPi suggests RV dysfunction.   Cardiac MRI done 05/26/22 showed: 1.  Normal LV size, severe asymmetric septal hypertrophy.  2.  Basal septal wall measuring upto 1.8 cm in thickness.  3.  Mid wall LGE/scar in the basal septum (area of maximal thickness).  4.  No LVOT obstruction noted.  5.  Study quality was poor with gating and motion artifact. EF could not be calculated. Appears mildly reduced visually.  6.  Findings consistent with HCM, asymmetric septal variant.  Was in the ED 07/20/22 due to abd/ back pain. Admitted 04/19/22 due to acute onset of worsening shortness of breath over the last 4 days with associated cough as well as nausea and vomiting. Initially given IV lasix with transition to oral medications. Pulmonology consult obtained. Given IV solu-medrol. RSV +. Weaned off oxygen to room air. Discharged after 7 days. Was in the ED 03/22/22 but LWBS.   He presents today for a HF follow-up visit with a chief complaint of minimal SOB with moderate exertion. Chronic in nature and he says that it feels worse because of all the "stress at home". He has associated fatigue and occasional palpitations along with this. Denies any difficulty sleeping, abdominal distention, pedal edema, chest pain, dizziness or weight gain  Says that he didn't take his medications yet today because he woke up late but hasn't missed taking them. Reports being  under a lot of stress due to "things at home". He did not bring his medications with him nor a list and is unable to state what he's taking.   Wore a zio monitor due to syncopal event and it showed rare PVC's and few short NSVT. This results was told to him today as he says that no one had called him about this.    Past Medical History:  Diagnosis Date   Arrhythmia    atrial flutter   CHF (congestive heart failure) (HCC)    Chronic kidney disease    COPD (chronic obstructive pulmonary disease) ([place])    Coronary artery disease    Depression    Diabetes mellitus without complication ([place])    GERD (gastroesophageal reflux disease)    Gout    Hypertension    Influenza A with respiratory manifestations    Mental disorder    Past Surgical History:  Procedure Laterality Date   ANKLE SURGERY     CARDIAC CATHETERIZATION     CARDIOVERSION N/A 07/08/2020   Procedure: CARDIOVERSION;  Surgeon: Corey Skains, MD;  Location: [facility];  Service: Cardiovascular;  Laterality: N/A;   COLONOSCOPY WITH PROPOFOL N/A 11/19/2021   Procedure: COLONOSCOPY WITH PROPOFOL;  Surgeon: Daryel November, MD;  Location: [facility];  Service: Gastroenterology;  Laterality: N/A;   HERNIA REPAIR     x2   IR RADIOLOGIST EVAL & MGMT  05/13/2022   POLYPECTOMY  11/19/2021   Procedure: POLYPECTOMY;  Surgeon: Candis Schatz,  Gladstone Pih, MD;  Location: [facility];  Service: Gastroenterology;;   RADIOLOGY WITH ANESTHESIA N/A 06/23/2022   Procedure: Right renal tumor ablation;  Surgeon: Suzette Battiest, MD;  Location: [facility];  Service: Radiology;  Laterality: N/A;   RIGHT HEART CATH N/A 07/13/2022   Procedure: RIGHT HEART CATH;  Surgeon: [name], MD;  Location: [facility];  Service: Cardiovascular;  Laterality: N/A;   SHOULDER SURGERY     TEE WITHOUT CARDIOVERSION N/A 07/08/2020   Procedure: TRANSESOPHAGEAL ECHOCARDIOGRAM (TEE);  Surgeon: Corey Skains, MD;  Location: [facility];  Service: Cardiovascular;   Laterality: N/A;   Family History  Problem Relation Age of Onset   Heart disease Father    Diabetes Mother    HIV Brother    Healthy Son    Healthy Daughter    Social History   Tobacco Use   Smoking status: Every Day    Packs/day: 1.00    Years: 43.00    Additional pack years: 0.00    Total pack years: 43.00    Types: Cigarettes   Smokeless tobacco: Never   Tobacco comments:        4 cigs daily--05/12/2022  Substance Use Topics   Alcohol use: No   No Known Allergies  Prior to Admission medications   Medication Sig Start Date End Date Taking? Authorizing Provider  Accu-Chek Softclix Lancets lancets Use as instructed 04/28/22  Yes [name], MD  acetaminophen (TYLENOL) 500 MG tablet Take 500-1,000 mg by mouth every 6 (six) hours as needed (pain.).   Yes [provider]  albuterol (PROVENTIL) (2.5 MG/3ML) 0.083% nebulizer solution Take 3 mLs (2.5 mg total) by nebulization every 4 (four) hours as needed for wheezing or shortness of breath. 04/26/22  Yes Wieting, Richard, MD  albuterol (VENTOLIN HFA) 108 (90 Base) MCG/ACT inhaler Inhale 2 puffs into the lungs every 6 (six) hours as needed for wheezing or shortness of breath. 04/26/22  Yes Wieting, Richard, MD  allopurinol (ZYLOPRIM) 100 MG tablet TAKE 2 TABLETS (200 MG TOTAL) BY MOUTH DAILY. 05/12/22  Yes [name], MD  apixaban (ELIQUIS) 5 MG TABS tablet Take 1 tablet (5 mg total) by mouth 2 (two) times daily. 08/03/22  Yes Agbor-Etang, Aaron Edelman, MD  atorvastatin (LIPITOR) 40 MG tablet Take 1 tablet (40 mg total) by mouth daily. 06/10/22  Yes [name], FNP  Blood Glucose Monitoring Suppl (ACCU-CHEK GUIDE) w/Device KIT Use as directed 04/28/22  Yes [name], MD  budesonide-formoterol [facility]) 160-4.5 MCG/ACT inhaler Inhale 2 puffs into the lungs 2 (two) times daily. 04/26/22  Yes Wieting, Richard, MD  carvedilol (COREG) 6.25 MG tablet Take 1 tablet (6.25 mg total) by mouth 2 (two) times  daily. 06/10/22  Yes [name], FNP  colchicine 0.6 MG tablet Take 0.5 tablets (0.3 mg total) by mouth three times a week. 08/09/22  Yes [name], MD  dapagliflozin propanediol (FARXIGA) 10 MG TABS tablet Take 1 tablet (10 mg total) by mouth daily. 06/10/22  Yes Aldin Drees, Otila Kluver A, FNP  DULoxetine (CYMBALTA) 20 MG capsule Take 1 capsule (20 mg total) by mouth daily. 05/11/22  Yes [name], MD  glucose blood (ACCU-CHEK GUIDE) test strip Use as directed to check blood sugar 1-2 times a day 04/28/22  Yes [name], MD  isosorbide-hydrALAZINE (BIDIL) 20-37.5 MG tablet Take 1 tablet by mouth 3 (three) times daily. 08/03/22  Yes Agbor-Etang, Aaron Edelman, MD  montelukast (SINGULAIR) 10 MG tablet TAKE  1 TABLET (10 MG TOTAL) BY MOUTH AT BEDTIME. 04/26/22 04/26/23 Yes Wieting, Richard, MD  potassium chloride (KLOR-CON M) 10 MEQ tablet Take 2 tablets (20 mEq total) by mouth daily. 07/13/22  Yes [name], MD  Tiotropium Bromide Monohydrate (SPIRIVA RESPIMAT) 2.5 MCG/ACT AERS Inhale 2 puffs into the lungs daily. 04/26/22  Yes Wieting, Richard, MD  torsemide (DEMADEX) 20 MG tablet Take by mouth daily. Take '40mg'$  every other day alternating with '20mg'$  every other day.   Yes [provider]  methylPREDNISolone (MEDROL DOSEPAK) 4 MG TBPK tablet Use as directed 06/29/22   [name], PA-C  oxyCODONE-acetaminophen (PERCOCET) 5-325 MG tablet Take 1 tablet by mouth every 4 (four) hours as needed for severe pain. 07/20/22   Harvest Dark, MD  metoprolol tartrate (LOPRESSOR) 100 MG tablet Take 1 tablet (100 mg total) by mouth 2 (two) times daily. 07/09/20 07/11/20  [name], MD   Review of Systems  Constitutional:  Positive for fatigue (at times). Negative for appetite change.  HENT:  Negative for congestion, postnasal drip and sore throat.   Eyes: Negative.   Respiratory:  Positive for cough (dry) and shortness of breath. Negative for chest tightness.   Cardiovascular:   Positive for palpitations (at times). Negative for chest pain and leg swelling.  Gastrointestinal:  Negative for abdominal distention and abdominal pain.  Endocrine: Negative.   Genitourinary: Negative.   Musculoskeletal:  Negative for back pain and neck pain.  Skin: Negative.   Allergic/Immunologic: Negative.   Neurological:  Negative for dizziness and light-headedness.  Hematological:  Negative for adenopathy. Does not bruise/bleed easily.  Psychiatric/Behavioral:  Negative for dysphoric mood and sleep disturbance (chronic; sleeping on 2 pillows). The patient is nervous/anxious.    Vitals:   08/26/22 0930  BP: (!) 150/100  Pulse: 73  SpO2: 98%  Weight: 255 lb (115.7 kg)   Wt Readings from Last 3 Encounters:  08/26/22 255 lb (115.7 kg)  08/09/22 255 lb (115.7 kg)  08/03/22 253 lb (114.8 kg)   Lab Results  Component Value Date   CREATININE 2.54 (H) 07/20/2022   CREATININE 1.86 (H) 07/09/2022   CREATININE 1.70 (H) 06/29/2022   Physical Exam Vitals and nursing note reviewed.  Constitutional:      Appearance: Normal appearance.  HENT:     Head: Normocephalic and atraumatic.  Cardiovascular:     Rate and Rhythm: Normal rate and regular rhythm.  Pulmonary:     Effort: Pulmonary effort is normal. No respiratory distress.     Breath sounds: No wheezing or rales.  Abdominal:     General: There is no distension.     Palpations: Abdomen is soft.  Musculoskeletal:        General: No tenderness.     Cervical back: Normal range of motion and neck supple.     Right lower leg: No edema.     Left lower leg: No edema.  Skin:    General: Skin is warm and dry.  Neurological:     General: No focal deficit present.     Mental Status: He is alert and oriented to person, place, and time.  Psychiatric:        Mood and Affect: Mood is anxious.        Behavior: Behavior normal.        Thought Content: Thought content normal.    Assessment & Plan:  1: Chronic heart failure with  preserved ejection fraction with structural changes (LVH)- - NYHA class II - euvolemic  today - not weighing daily but does have scales; emphasized weighing daily so that he can call for an overnight weight gain of > 2 pounds or a weekly weight gain of > 5 pounds - weight up 1.5 pounds from last visit here 6 weeks ago - echo 8/15//23: EF of 50% along with moderate LVH and mild/moderate LAE. EF much improved from previous 30-35% - RHC 07/13/22: 1. Elevated right and left heart filling pressures. 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 Mildly low cardiac output. 4. Low PAPi suggests RV dysfunction.  - cMRI done 05/26/22 showed: 1.  Normal LV size, severe asymmetric septal hypertrophy.  2.  Basal septal wall measuring upto 1.8 cm in thickness.  3.  Mid wall LGE/scar in the basal septum (area of maximal thickness).  4.  No LVOT obstruction noted.  5.  Study quality was poor with gating and motion artifact. EF could not be calculated. Appears mildly reduced visually.  6.  Findings consistent with HCM, asymmetric septal variant. - carvedilol 6.'25mg'$  BID - farxiga '10mg'$  daily - torsemide '20mg'$  QOD with '40mg'$  QOD - saw [facility] MD Aundra Dubin) 08/03/22 - ReDs reading today was 33% - BNP 07/09/22 was 732.9  2: HTN- - BP 150/100 but he hasn't taken his medications yet today and has no idea what he's actually taking - saw PCP Wynetta Emery) 08/09/22 - bidil 20/37.'5mg'$  TID - BMP 07/23/22 reviewed and showed sodium 137, potassium 4.0, creatinine 2.54 and GFR 28 - BMP; patient says that he can't stay today but will return either tomorrow or next week - saw nephrology Holley Raring) 05/19/22  3: COPD- - saw pulmonology ([place]) 05/12/22  4: Tobacco use- - smoking 1 pack of cigarettes/ week - smokes marijuana - denies using alcohol - + for cocaine in urine on 04/20/22 - denies any cocaine use but does admit to selling it and feels like the cocaine gets absorbed into his  skin from when he packages it  5: Atrial flutter- - DCCV 06/2020 - eliquis '5mg'$  BID - recent zio showed rare PVC's and few short NSVT - saw cardiology (Agbor-Etang) 08/03/22   Patient did not bring his medication bottles nor a list. Emphasized how important it was to bring his medication bottles to every visit every single time.   Return in 1 month, sooner if needed

## 2022-08-26 NOTE — Progress Notes (Signed)
REDS VEST READING= 33 CHEST RULER=40  VEST FITTING TASKS: POSTURE=sitting HEIGHT MARKER=D CENTER STRIP=aligned  COMMENTS:

## 2022-08-26 NOTE — Patient Instructions (Signed)
Go to the [facility] entrance and go to the first desk on your right to the registration desk and tell them you are getting your lab work drawn.

## 2022-08-26 NOTE — Progress Notes (Signed)
Psychiatric Initial Adult Assessment  Patient Identification: Chris [name] MRN:  [id number] Date of Evaluation:  08/26/2022 Referral Source: Karle Plumber MD  Assessment:  Chris [name] is a 62 y.o. male with a history of depression, hypertension, obesity, HFrEF (initial EF 25%, normalized to 50%) atrial flutter s/p DCCV 06/2020, COPD, current smoker x40+ years, former cocaine use x25+ years, HCM, CKD  who presents in person to [facility] for initial evaluation of severe depression. Patient reports symptoms consistent with major depressive disorder and generalized anxiety disorder. He was started on duloxetine 20 mg by PCP so will increase dose by 10 mg this visit. Will carefully monitor given patient's hx of CKD for side effects. We may switch to a safer medication such as zoloft and/or mirtazapine. Initially considered seroquel for racing thoughts and mild paranoia but Qtc on 07/21/22 ECG was 507. Concern for PTSD but will explore more thoroughly next visit.   Plan: # Major Depressive Disorder-recurrent episode, moderate Past medication trials:  Status of problem: acute Interventions: -- Increase duloxetine to 30 mg daily  -Careful monitoring of kidney function as Cr clearance was 28 last visit --Start trazodone 50-100 mg qhs prn for insomnia  Patient was given contact information for behavioral health clinic and was instructed to call 911 for emergencies.   Subjective:  Chief Complaint:  Chief Complaint  Patient presents with   Depression    History of Present Illness:   Patient reports having a longstanding history of depression and anxiety.  He reports having symptoms of depressed mood, anhedonia, poor sleep (3 hours/day), poor energy, appropriate appetite, difficulty concentrating.  He denies ever having suicidal thoughts nor has he ever attempted suicide.  He is able to contract for safety.  He reports depressive symptoms have lasted for many years now  as he feels that there are "people with authority pushing down on me".  He ruminates over the injustice he feels regarding the before charges as well as people who tend to remind him of his past.  He did not go into details regarding his life experience but does state that it was traumatic and has caused much distress.  He called Mr. Norberto Sorenson (friend) over the phone during our visit and effort for friend to explain patient's distress including how people tend to constantly bring up the past.  Patient felt that there have been many times where he is felt frustrated enough that he would "lash out" but he has never done this and he states he never will as he feels that this is not an appropriate way to express his emotions.  He does state that he tends to self isolate in his home in an effort to prevent himself from lashing out at others.  He states he tries to be a "man of good men" and it is difficult for him sometimes due to him feeling that others are insulting him and using their authority to make his life miserable.   He endorses an episode of mania that at happen many years before with racing thoughts, increased   Past Psychiatric History:  Diagnoses: none Medication trials: duloxetine Previous psychiatrist/therapist: none Hospitalizations: none Suicide attempts: denies SIB: denies Hx of violence towards others: denies Current access to guns: denies   Substance Abuse History in the last 12 months:  Yes.    Past Medical History:  Past Medical History:  Diagnosis Date   Arrhythmia    atrial flutter   CHF (congestive heart failure) ([place])  Chronic kidney disease    COPD (chronic obstructive pulmonary disease) (HCC)    Coronary artery disease    Depression    Diabetes mellitus without complication (HCC)    GERD (gastroesophageal reflux disease)    Gout    Hypertension    Influenza A with respiratory manifestations    Mental disorder     Past Surgical History:  Procedure Laterality  Date   ANKLE SURGERY     CARDIAC CATHETERIZATION     CARDIOVERSION N/A 07/08/2020   Procedure: CARDIOVERSION;  Surgeon: Corey Skains, MD;  Location: [facility];  Service: Cardiovascular;  Laterality: N/A;   COLONOSCOPY WITH PROPOFOL N/A 11/19/2021   Procedure: COLONOSCOPY WITH PROPOFOL;  Surgeon: Daryel November, MD;  Location: [facility];  Service: Gastroenterology;  Laterality: N/A;   HERNIA REPAIR     x2   IR RADIOLOGIST EVAL & MGMT  05/13/2022   POLYPECTOMY  11/19/2021   Procedure: POLYPECTOMY;  Surgeon: Daryel November, MD;  Location: [facility];  Service: Gastroenterology;;   RADIOLOGY WITH ANESTHESIA N/A 06/23/2022   Procedure: Right renal tumor ablation;  Surgeon: Suzette Battiest, MD;  Location: [facility];  Service: Radiology;  Laterality: N/A;   RIGHT HEART CATH N/A 07/13/2022   Procedure: RIGHT HEART CATH;  Surgeon: [name], MD;  Location: [facility];  Service: Cardiovascular;  Laterality: N/A;   SHOULDER SURGERY     TEE WITHOUT CARDIOVERSION N/A 07/08/2020   Procedure: TRANSESOPHAGEAL ECHOCARDIOGRAM (TEE);  Surgeon: Corey Skains, MD;  Location: [facility];  Service: Cardiovascular;  Laterality: N/A;     Family History:  Family History  Problem Relation Age of Onset   Heart disease Father    Diabetes Mother    HIV Brother    Healthy Son    Healthy Daughter     Social History:   Social History   Socioeconomic History   Marital status: Divorced    Spouse name: Not on file   Number of children: 3   Years of education: Not on file   Highest education level: High school graduate  Occupational History   Occupation: disability  Tobacco Use   Smoking status: Every Day    Packs/day: 1.00    Years: 43.00    Additional pack years: 0.00    Total pack years: 43.00    Types: Cigarettes   Smokeless tobacco: Never   Tobacco comments:        4 cigs daily--05/12/2022  Vaping Use   Vaping Use: Never used  Substance and Sexual Activity   Alcohol  use: No   Drug use: Yes    Frequency: 21.0 times per week    Types: Marijuana, Cocaine    Comment: last use Cocaine- 03/28/2021. Still using marijuana, last use 06/22/21   Sexual activity: Not on file  Other Topics Concern   Not on file  Social History Narrative   ** Merged History Encounter **       Social Determinants of Health   Financial Resource Strain: Medium Risk (07/23/2022)   Overall Financial Resource Strain (CARDIA)    Difficulty of Paying Living Expenses: Somewhat hard  Food Insecurity: No Food Insecurity (04/20/2022)   Hunger Vital Sign    Worried About Running Out of Food in the Last Year: Never true    Ran Out of Food in the Last Year: Never true  Transportation Needs: Unmet Transportation Needs (07/23/2022)   PRAPARE - Hydrologist (Medical): Yes  Lack of Transportation (Non-Medical): Yes  Physical Activity: Insufficiently Active (11/16/2021)   Exercise Vital Sign    Days of Exercise per Week: 5 days    Minutes of Exercise per Session: 20 min  Stress: Stress Concern Present (08/19/2022)   [place]    Feeling of Stress : Rather much  Social Connections: Moderately Isolated (07/23/2022)   Social Connection and Isolation Panel [NHANES]    Frequency of Communication with Friends and Family: Twice a week    Frequency of Social Gatherings with Friends and Family: Once a week    Attends Religious Services: More than 4 times per year    Active Member of [employer] or Organizations: No    Attends Archivist Meetings: Never    Marital Status: Divorced     Allergies:  No Known Allergies  Current Medications: Current Outpatient Medications  Medication Sig Dispense Refill   traZODone (DESYREL) 50 MG tablet Take 1-2 tablets (50-100 mg total) by mouth at bedtime. 60 tablet 1   Accu-Chek Softclix Lancets lancets Use as instructed 100 each 12   acetaminophen (TYLENOL) 500 MG  tablet Take 500-1,000 mg by mouth every 6 (six) hours as needed (pain.).     albuterol (PROVENTIL) (2.5 MG/3ML) 0.083% nebulizer solution Take 3 mLs (2.5 mg total) by nebulization every 4 (four) hours as needed for wheezing or shortness of breath. 300 mL 0   albuterol (VENTOLIN HFA) 108 (90 Base) MCG/ACT inhaler Inhale 2 puffs into the lungs every 6 (six) hours as needed for wheezing or shortness of breath. 6.7 g 0   allopurinol (ZYLOPRIM) 100 MG tablet TAKE 2 TABLETS (200 MG TOTAL) BY MOUTH DAILY. 180 tablet 0   apixaban (ELIQUIS) 5 MG TABS tablet Take 1 tablet (5 mg total) by mouth 2 (two) times daily. 60 tablet 2   atorvastatin (LIPITOR) 40 MG tablet Take 1 tablet (40 mg total) by mouth daily. 90 tablet 3   Blood Glucose Monitoring Suppl (ACCU-CHEK GUIDE) w/Device KIT Use as directed 1 kit 0   budesonide-formoterol (SYMBICORT) 160-4.5 MCG/ACT inhaler Inhale 2 puffs into the lungs 2 (two) times daily. 10.2 g 0   carvedilol (COREG) 6.25 MG tablet Take 1 tablet (6.25 mg total) by mouth 2 (two) times daily. 180 tablet 3   colchicine 0.6 MG tablet Take 0.5 tablets (0.3 mg total) by mouth three times a week. 15 tablet 1   dapagliflozin propanediol (FARXIGA) 10 MG TABS tablet Take 1 tablet (10 mg total) by mouth daily. 90 tablet 3   DULoxetine (CYMBALTA) 30 MG capsule Take 1 capsule (30 mg total) by mouth daily. 30 capsule 1   glucose blood (ACCU-CHEK GUIDE) test strip Use as directed to check blood sugar 1-2 times a day 100 each 12   isosorbide-hydrALAZINE (BIDIL) 20-37.5 MG tablet Take 1 tablet by mouth 3 (three) times daily. 270 tablet 3   methylPREDNISolone (MEDROL DOSEPAK) 4 MG TBPK tablet Use as directed 21 tablet 0   montelukast (SINGULAIR) 10 MG tablet TAKE 1 TABLET (10 MG TOTAL) BY MOUTH AT BEDTIME. 30 tablet 0   oxyCODONE-acetaminophen (PERCOCET) 5-325 MG tablet Take 1 tablet by mouth every 4 (four) hours as needed for severe pain. 12 tablet 0   potassium chloride (KLOR-CON M) 10 MEQ tablet  Take 2 tablets (20 mEq total) by mouth daily. 180 tablet 3   Tiotropium Bromide Monohydrate (SPIRIVA RESPIMAT) 2.5 MCG/ACT AERS Inhale 2 puffs into the lungs daily. 4 g 0  torsemide (DEMADEX) 20 MG tablet Take by mouth daily. Take 40mg  every other day alternating with 20mg  every other day.     No current facility-administered medications for this visit.    ROS: Review of Systems  Objective:  Psychiatric Specialty Exam: There were no vitals taken for this visit.There is no height or weight on file to calculate BMI.  General Appearance: Casual and Fairly Groomed  Eye Contact:  Good  Speech:  Clear and Coherent and Normal Rate  Volume:  Normal  Mood:  Dysphoric and Irritable  Affect:  Congruent, Labile, and Tearful  Thought Content: Logical   Suicidal Thoughts:  No  Homicidal Thoughts:  No  Thought Process:  Coherent, Goal Directed, and Linear  Orientation:  Full (Time, Place, and Person)    Memory:  Remote;   Good  Judgment:  Fair  Insight:  Fair  Concentration:  Concentration: Good and Attention Span: Good  Recall:  Good  Fund of Knowledge: Good  Language: Good  Psychomotor Activity:  Normal  Akathisia:  No  AIMS (if indicated): not done  Assets:  Communication Skills Desire for Improvement Financial Resources/Insurance Leisure Time Physical Health Resilience Social Support Talents/Skills Transportation  ADL's:  Intact  Cognition: WNL  Sleep:  Good   PE: General: well-appearing; no acute distress  Pulm: no increased work of breathing on room air  Strength & Muscle Tone: within normal limits Neuro: no focal neurological deficits observed  Gait & Station: ataxic  Metabolic Disorder Labs: Lab Results  Component Value Date   HGBA1C 6.2 08/09/2022   MPG 131.24 04/20/2022   MPG 151.33 02/09/2021   No results found for: "PROLACTIN" Lab Results  Component Value Date   CHOL 176 12/25/2019   TRIG 91 12/25/2019   HDL 37 (L) 12/25/2019   CHOLHDL 4.8 12/25/2019    VLDL 18 12/25/2019   LDLCALC 121 (H) 12/25/2019   LDLCALC 92 11/07/2019   Lab Results  Component Value Date   TSH 1.592 10/26/2019    Therapeutic Level Labs: No results found for: "LITHIUM" No results found for: "CBMZ" No results found for: "VALPROATE"  Screenings:  AUDIT    Flowsheet Row Admission (Discharged) from 02/18/2013 in [facility]  Alcohol Use Disorder Identification Test Final Score (AUDIT) 1      GAD-7    Flowsheet Row Office Visit from 08/09/2022 in [facility] Office Visit from 05/11/2022 in [facility] Office Visit from 12/21/2021 in [facility] Office Visit from 05/14/2021 in [facility] Office Visit from 11/12/2020 in [facility]  Total GAD-7 Score 15 16 8 5 12       PHQ2-9    [place] Office Visit from 08/26/2022 in [facility] Office Visit from 08/09/2022 in [place] Patient Outreach Telephone from 07/08/2022 in [facility] Office Visit from 05/11/2022 in [facility] Office Visit from 12/21/2021 in [place]  PHQ-2 Total Score 5 5 4 2 3   PHQ-9 Total Score 17 17 16 14 10       Flowsheet Row ED from 07/20/2022 in [facility] Emergency Department at [facility] Admission (Discharged) from 07/13/2022 in [facility] ED from 06/29/2022 in [facility] Emergency Department at [facility]  C-SSRS RISK CATEGORY No Risk No Risk No Risk       Collaboration of Care: Collaboration of Care:   Patient/Guardian was advised Release of Information must be obtained prior to any record release in order to collaborate their care with an outside provider.  Patient/Guardian was advised if they have not already done so to contact the registration department to sign all necessary forms in order for [place] to release information regarding their care.   Consent: Patient/Guardian gives verbal consent for treatment and assignment of benefits for services provided during this visit. Patient/Guardian expressed understanding and agreed to proceed.   A total of 50 minutes was spent involved in face to face clinical care, chart review, and documentation.   France Ravens, MD 3/14/20243:44 PM

## 2022-08-27 ENCOUNTER — Other Ambulatory Visit: Payer: Medicaid Other | Admitting: Licensed Clinical Social Worker

## 2022-08-27 ENCOUNTER — Other Ambulatory Visit: Payer: Medicaid Other

## 2022-08-27 NOTE — Patient Outreach (Signed)
Medicaid Managed Care Social Work Note  08/27/2022 Name:  Chris [name] MRN:  [id number] DOB:  1960-11-18  Chris [name] is an 62 y.o. year old male who is a primary patient of [name], MD.  The Medicaid Managed Care Coordination team was consulted for assistance with:  Community Resources   Mr. Hodgkinson was given information about Medicaid Managed Care Coordination team services today. [name] Patient agreed to services and verbal consent obtained.  Engaged with patient  for by telephone forfollow up visit in response to referral for case management and/or care coordination services.   Assessments/Interventions:  Review of past medical history, allergies, medications, health status, including review of consultants reports, laboratory and other test data, was performed as part of comprehensive evaluation and provision of chronic care management services.  SDOH: (Social Determinant of Health) assessments and interventions performed: SDOH Interventions    Flowsheet Row Patient Outreach Telephone from 08/19/2022 in [place] Patient Outreach Telephone from 07/27/2022 in [facility] [facility] Patient Outreach Telephone from 07/23/2022 in [facility] Patient Outreach Telephone from 07/08/2022 in [facility] ED to Hosp-Admission (Discharged) from 04/19/2022 in [facility] PCU Patient Outreach Telephone from 11/16/2021 in [place] Coordination  SDOH Interventions        Housing Interventions -- -- Intervention Not Indicated -- -- --  Transportation Interventions -- -- Other (Comment)  Dispensing optician arranged by [place] for upcoming appointments] -- Patient Resources Tax adviser) --  Utilities Interventions -- -- Other (Comment)  [MM Social WOrk team consulted] -- -- --  Depression Interventions/Treatment   -- -- -- Referral to Psychiatry -- --  Financial Strain Interventions -- -- Other (Comment)  [MM Social Work Support] -- -- --  Physical Activity Interventions -- -- -- -- -- Intervention Not Indicated  Stress Interventions Offered [name], Provide Counseling Provide Counseling, [place] Resources, Provide Counseling -- --  Social Connections Interventions -- -- Other (Comment)  [SW team referral] -- -- --       Advanced Directives Status:  Not addressed in this encounter.  Care Plan                 No Known Allergies  Medications Reviewed Today     Reviewed by Alisa Graff, FNP (Family Nurse Practitioner) on 08/26/22 at 84  Med List Status: <None>   Medication Order Taking? Sig Documenting Provider Last Dose Status Informant  Accu-Chek Softclix Lancets lancets [id number] Yes Use as instructed [name], MD Taking Active Self  acetaminophen (TYLENOL) 500 MG tablet [id number] Yes Take 500-1,000 mg by mouth every 6 (six) hours as needed (pain.). [provider] Taking Active Self  albuterol (PROVENTIL) (2.5 MG/3ML) 0.083% nebulizer solution [id number] Yes Take 3 mLs (2.5 mg total) by nebulization every 4 (four) hours as needed for wheezing or shortness of breath. [name], MD Taking Active Self  albuterol (VENTOLIN HFA) 108 (90 Base) MCG/ACT inhaler [id number] Yes Inhale 2 puffs into the lungs every 6 (six) hours as needed for wheezing or shortness of breath. [name], MD Taking Active Self  allopurinol (ZYLOPRIM) 100 MG tablet [id number] Yes TAKE 2 TABLETS (200 MG TOTAL) BY MOUTH DAILY. [name], MD Taking Active Self  apixaban (ELIQUIS) 5 MG TABS tablet [id number] Yes Take 1 tablet (5 mg total) by mouth 2 (two) times daily.  [name], MD Taking Active            Med Note Thamas Jaegers, MELANIE A   Thu Aug 05, 2022 10:23 AM) Needs to pick up from pharmacy   atorvastatin (LIPITOR) 40 MG tablet [id number] Yes Take 1 tablet (40 mg total) by mouth daily. Alisa Graff, FNP Taking Active Self  Blood Glucose Monitoring Suppl (ACCU-CHEK GUIDE) w/Device [name] [id number] Yes Use as directed [name], MD Taking Active Self  budesonide-formoterol [facility]) 160-4.5 MCG/ACT inhaler [id number] Yes Inhale 2 puffs into the lungs 2 (two) times daily. [name], MD Taking Active Self  carvedilol (COREG) 6.25 MG tablet [id number] Yes Take 1 tablet (6.25 mg total) by mouth 2 (two) times daily. Alisa Graff, FNP Taking Active Self  colchicine 0.6 MG tablet [id number] Yes Take 0.5 tablets (0.3 mg total) by mouth three times a week. [name], MD Taking Active   dapagliflozin propanediol (FARXIGA) 10 MG TABS tablet [id number] Yes Take 1 tablet (10 mg total) by mouth daily. Alisa Graff, FNP Taking Active Self  DULoxetine (CYMBALTA) 20 MG capsule [id number] Yes Take 1 capsule (20 mg total) by mouth daily. [name], MD Taking Active Self           Med Note [name], [name]   Thu Jun 17, 2022  3:57 PM)    glucose blood (ACCU-CHEK GUIDE) test strip [id number] Yes Use as directed to check blood sugar 1-2 times a day [name], MD Taking Active Self  isosorbide-hydrALAZINE (BIDIL) 20-37.5 MG tablet [id number] Yes Take 1 tablet by mouth 3 (three) times daily. [name], MD Taking Active   methylPREDNISolone (MEDROL DOSEPAK) 4 MG TBPK tablet [id number]  Use as directed [name], PA-C  Active Self           Med Note Clerance Lav   Fri Jul 23, 2022  2:15 PM) Patient reports he completed the dose pack as prescribed    Discontinued 07/11/20 0943 (Discontinued by provider)   montelukast (SINGULAIR) 10 MG tablet [id number] Yes TAKE 1 TABLET (10 MG TOTAL) BY MOUTH AT BEDTIME. [name], MD Taking Active Self  oxyCODONE-acetaminophen (PERCOCET) 5-325 MG tablet [id number]  Take 1 tablet by mouth every 4 (four) hours as  needed for severe pain. Harvest Dark, MD  Active   potassium chloride (KLOR-CON M) 10 MEQ tablet [id number] Yes Take 2 tablets (20 mEq total) by mouth daily. [name], MD Taking Active   Tiotropium Bromide Monohydrate (SPIRIVA RESPIMAT) 2.5 MCG/ACT AERS [id number] Yes Inhale 2 puffs into the lungs daily. [name], MD Taking Active Self  torsemide (DEMADEX) 20 MG tablet [id number] Yes Take by mouth daily. Take 40mg  every other day alternating with 20mg  every other day. [provider] Taking Active           [name] completed a telephone outreach with patient, he stated he did speak with [facility] and they stated they would cover 200 towards his utility bil, but does not know if the 200 has been applied yet. Patient stated he did not speak with his PCP about the referral for pain management. [name] did provide PCP with the areas patient states are in pain.  Patient Active Problem List   Diagnosis Date Noted   MDD (major depressive disorder), recurrent episode, moderate ([place]) 08/26/2022   Stage 3b chronic kidney disease ([place]) 08/09/2022   Hypertrophic cardiomyopathy ([place]) 08/09/2022   History of renal cell cancer 08/09/2022   Chronic heart failure  with preserved ejection fraction (HFpEF) ([place]) 07/13/2022   Agitation 04/23/2022   RSV (respiratory syncytial virus pneumonia) 04/22/2022   Obesity (BMI 30-39.9) 04/21/2022   COPD exacerbation ([place]) 04/19/2022   Dyslipidemia 04/19/2022   Gout 04/19/2022   Right kidney mass 05/14/2021   CHF exacerbation ([place]) 04/14/2021   Chest pain 04/14/2021   Syncope 04/14/2021   Left-sided weakness 10/28/2020   Typical atrial flutter (HCC)    CHF (congestive heart failure) ([place]) 07/04/2020   Acute exacerbation of CHF (congestive heart failure) ([place]) 06/16/2020   Influenza vaccine refused 05/06/2020   Acute decompensated heart failure ([place]) 05/04/2020   Illiteracy 05/04/2020   Type 2 diabetes mellitus with stage 3 chronic kidney  disease ([place]) 12/25/2019   Elevated troponin I level 10/26/2019   History of gout 02/01/2019   Seasonal allergic rhinitis due to pollen 02/01/2019   Tobacco dependence 11/30/2018   Microscopic hematuria 11/30/2018   Depression 11/30/2018   Difficulty controlling anger 11/30/2018   COPD (chronic obstructive pulmonary disease) (HCC)    CKD (chronic kidney disease) stage 3, GFR 30-59 ml/min (HCC) 08/10/2018   Recurrent epistaxis 04/21/2018   Mixed hyperlipidemia 07/28/2017   Essential hypertension [id number]   Chronic systolic heart failure ([place]) 10/25/2014   Cocaine abuse ([place]) 02/20/2013   Cannabis abuse 02/20/2013   Back pain, chronic 02/20/2013    Conditions to be addressed/monitored per PCP order:   community resources  There are no care plans that you recently modified to display for this patient.   Follow up:  Patient agrees to Care Plan and Follow-up.  Plan: The Managed Medicaid care management team will reach out to the patient again over the next 30 days.  Date/time of next scheduled Social Work care management/care coordination outreach:  09/27/22  [name], Arita Miss, [place] Medicaid Team  [phone]

## 2022-08-27 NOTE — Patient Instructions (Signed)
Visit Information  Chris [name] was given information about Medicaid Managed Care team care coordination services as a part of their [employer] benefit. Chris [name] verbally consented to engagement with the Medicaid Managed Care team.   If you are experiencing a medical emergency, please call 911 or report to your local emergency department or urgent care.   If you have a non-emergency medical problem during routine business hours, please contact your provider's office and ask to speak with a nurse.   For questions related to your [employer] Medicaid health plan, please call: [phone] or visit the homepage here: [email or web address]  If you would like to schedule transportation through your [employer] Medicaid plan, please call the following number at least 2 days in advance of your appointment: [phone]  For information about your ride after you set it up, call Ride Assist at [phone]. Use this number to activate a Will Call pickup, or if your transportation is late for a scheduled pickup. Use this number, too, if you need to make a change or cancel a previously scheduled reservation.  If you need transportation services right away, call [phone]. The after-hours call center is staffed 24 hours to handle ride assistance and urgent reservation requests (including discharges) 365 days a year. Urgent trips include sick visits, hospital discharge requests and life-sustaining treatment.  Call the Behavioral Health Crisis Line at [phone], at any time, 24 hours a day, 7 days a week. If you are in danger or need immediate medical attention call 911.  If you would like help to quit smoking, call 1-800-QUIT-NOW (1-800-784-8669) OR Espaol: 1-855-Djelo-Ya (1-855-335-3569) o para ms informacin haga clic aqu or Text READY to 200-400 to register via text  Chris [name], [name], MSW, LCSW Managed Medicaid LCSW [facility] [name] Phone: [phone]   

## 2022-08-27 NOTE — Patient Outreach (Signed)
Medicaid Managed Care Social Work Note  08/27/2022 Name:  Chris [name] MRN:  [id number] DOB:  [birth year]  Chris [name] is an 62 y.o. year old male who is a primary patient of Chris Pier, MD.  The Medicaid Managed Care Coordination team was consulted for assistance with:  [place] [facility]  Chris [name] was given information about Medicaid Managed Care Coordination team services today. Chris [name] Patient agreed to services and verbal consent obtained.  Engaged with patient  for by telephone forfollow up visit in response to referral for case management and/or care coordination services.   Assessments/Interventions:  Review of past medical history, allergies, medications, health status, including review of consultants reports, laboratory and other test data, was performed as part of comprehensive evaluation and provision of chronic care management services.  SDOH: (Social Determinant of Health) assessments and interventions performed: SDOH Interventions    Flowsheet Row Patient Outreach Telephone from 08/27/2022 in Chris [name] Patient Outreach Telephone from 08/19/2022 in Chris [name] Patient Outreach Telephone from 07/27/2022 in Chris [name] Patient Outreach Telephone from 07/23/2022 in Chris [name] Patient Outreach Telephone from 07/08/2022 in Chris [name] ED to Hosp-Admission (Discharged) from 04/19/2022 in Chris [name] Chris [name]  SDOH Interventions        Housing Interventions -- -- -- Intervention Not Indicated -- --  Transportation Interventions -- -- -- Other (Comment)  Dispensing optician arranged by Chris [name] for upcoming appointments] -- Patient Resources Tax adviser)  Utilities Interventions -- -- -- Other (Comment)  [MM Social WOrk team consulted] -- --  Depression  Interventions/Treatment  -- -- -- -- Referral to Psychiatry --  Financial Strain Interventions -- -- -- Other (Comment)  [MM Social Work Support] -- --  Stress Interventions Offered Chris [name], Provide Counseling Offered Chris [name], Provide Counseling Provide Counseling, [place] Resources, Provide Counseling --  Social Connections Interventions -- -- -- Other (Comment)  [SW team referral] -- --       Advanced Directives Status:  See Care Plan for related entries.  Care Plan                 No Known Allergies  Medications Reviewed Today     Reviewed by Chris Graff, FNP (Family Nurse Practitioner) on 08/26/22 at 57  Chris List Status: <None>   Medication Order Taking? Sig Documenting Provider Last Dose Status Informant  Accu-Chek Softclix Lancets lancets [id number] Yes Use as instructed Chris Pier, MD Taking Active Self  acetaminophen (TYLENOL) 500 MG tablet [id number] Yes Take 500-1,000 mg by mouth every 6 (six) hours as needed (pain.). [provider] Taking Active Self  albuterol (PROVENTIL) (2.5 MG/3ML) 0.083% nebulizer solution [id number] Yes Take 3 mLs (2.5 mg total) by nebulization every 4 (four) hours as needed for wheezing or shortness of breath. [name], MD Taking Active Self  albuterol (VENTOLIN HFA) 108 (90 Base) MCG/ACT inhaler [id number] Yes Inhale 2 puffs into the lungs every 6 (six) hours as needed for wheezing or shortness of breath. [name], MD Taking Active Self  allopurinol (ZYLOPRIM) 100 MG tablet [id number] Yes TAKE 2 TABLETS (200 MG TOTAL) BY MOUTH DAILY. Chris Pier, MD Taking Active Self  apixaban (ELIQUIS) 5 MG TABS tablet [id number] Yes Take 1 tablet (5 mg total) by mouth 2 (two) times daily. [name], MD Taking Active  Chris Note (ROBB, MELANIE A   Thu Aug 05, 2022 10:23 AM) Needs to pick up from pharmacy   atorvastatin (LIPITOR) 40 MG tablet [id number] Yes Take 1 tablet (40 mg total) by mouth daily. Chris Graff, FNP Taking Active Self  Blood Glucose Monitoring Suppl (ACCU-CHEK GUIDE) w/Device [name] [id number] Yes Use as directed Chris Pier, MD Taking Active Self  budesonide-formoterol [facility]) 160-4.5 MCG/ACT inhaler [id number] Yes Inhale 2 puffs into the lungs 2 (two) times daily. [name], MD Taking Active Self  carvedilol (COREG) 6.25 MG tablet [id number] Yes Take 1 tablet (6.25 mg total) by mouth 2 (two) times daily. Chris Graff, FNP Taking Active Self  colchicine 0.6 MG tablet [id number] Yes Take 0.5 tablets (0.3 mg total) by mouth three times a week. Chris Pier, MD Taking Active   dapagliflozin propanediol (FARXIGA) 10 MG TABS tablet [id number] Yes Take 1 tablet (10 mg total) by mouth daily. Chris Graff, FNP Taking Active Self  DULoxetine (CYMBALTA) 20 MG capsule [id number] Yes Take 1 capsule (20 mg total) by mouth daily. Chris Pier, MD Taking Active Self           Chris Note [name], [name]   Thu Jun 17, 2022  3:57 PM)    glucose blood (ACCU-CHEK GUIDE) test strip [id number] Yes Use as directed to check blood sugar 1-2 times a day Chris Pier, MD Taking Active Self  isosorbide-hydrALAZINE (BIDIL) 20-37.5 MG tablet [id number] Yes Take 1 tablet by mouth 3 (three) times daily. [name], MD Taking Active   methylPREDNISolone (MEDROL DOSEPAK) 4 MG TBPK tablet [id number]  Use as directed [name], PA-C  Active Self           Chris Note Clerance Lav   Fri Jul 23, 2022  2:15 PM) Patient reports he completed the dose pack as prescribed    Discontinued 07/11/20 0943 (Discontinued by provider)   montelukast (SINGULAIR) 10 MG tablet [id number] Yes TAKE 1 TABLET (10 MG TOTAL) BY MOUTH AT BEDTIME. [name], MD Taking Active Self  oxyCODONE-acetaminophen (PERCOCET) 5-325 MG tablet [id number]  Take 1 tablet by mouth every 4 (four) hours as  needed for severe pain. Harvest Dark, MD  Active   potassium chloride (KLOR-CON M) 10 MEQ tablet [id number] Yes Take 2 tablets (20 mEq total) by mouth daily. [name], MD Taking Active   Tiotropium Bromide Monohydrate (SPIRIVA RESPIMAT) 2.5 MCG/ACT AERS [id number] Yes Inhale 2 puffs into the lungs daily. [name], MD Taking Active Self  torsemide (DEMADEX) 20 MG tablet [id number] Yes Take by mouth daily. Take 40mg  every other day alternating with 20mg  every other day. [provider] Taking Active             Patient Active Problem List   Diagnosis Date Noted   MDD (major depressive disorder), recurrent episode, moderate ([place]) 08/26/2022   Stage 3b chronic kidney disease ([place]) 08/09/2022   Hypertrophic cardiomyopathy ([place]) 08/09/2022   History of renal cell cancer 08/09/2022   Chronic heart failure with preserved ejection fraction (HFpEF) ([place]) 07/13/2022   Agitation 04/23/2022   RSV (respiratory syncytial virus pneumonia) 04/22/2022   Obesity (BMI 30-39.9) 04/21/2022   COPD exacerbation ([place]) 04/19/2022   Dyslipidemia 04/19/2022   Gout 04/19/2022   Right kidney mass 05/14/2021   CHF exacerbation ([place]) 04/14/2021   Chest pain 04/14/2021   Syncope 04/14/2021   Left-sided weakness 10/28/2020   Typical atrial flutter (HCC)    CHF (congestive heart failure) ([place])  07/04/2020   Acute exacerbation of CHF (congestive heart failure) ([place]) 06/16/2020   Influenza vaccine refused 05/06/2020   Acute decompensated heart failure ([place]) 05/04/2020   Illiteracy 05/04/2020   Type 2 diabetes mellitus with stage 3 chronic kidney disease ([place]) 12/25/2019   Elevated troponin I level 10/26/2019   History of gout 02/01/2019   Seasonal allergic rhinitis due to pollen 02/01/2019   Tobacco dependence 11/30/2018   Microscopic hematuria 11/30/2018   Depression 11/30/2018   Difficulty controlling anger 11/30/2018   COPD (chronic obstructive pulmonary disease) (HCC)    CKD  (chronic kidney disease) stage 3, GFR 30-59 ml/min (HCC) 08/10/2018   Recurrent epistaxis 04/21/2018   Mixed hyperlipidemia 07/28/2017   Essential hypertension [id number]   Chronic systolic heart failure ([place]) 10/25/2014   Cocaine abuse ([place]) 02/20/2013   Cannabis abuse 02/20/2013   Back pain, chronic 02/20/2013    Conditions to be addressed/monitored per PCP order:  Depression  Care Plan : General Social Work (Adult)  Updates made by Greg Cutter, LCSW since 08/27/2022 12:00 AM     Problem: Depression Identification (Depression)      Long-Range Goal: To increase my self care and gain mental health support   Start Date: 07/08/2022  Priority: High  Note:   Timeframe:  Long-Range Goal Priority:  High Start Date:   07/08/22                    Expected End Date:ongoing    Follow Up Date- 09/15/22 at 9 am   - begin personal counseling - call and visit an old friend - check out volunteer opportunities - join a support group - laugh; watch a funny movie or comedian - learn and use visualization or guided imagery - perform a random act of kindness - practice relaxation or meditation daily - start or continue a personal journal - talk about feelings with a friend, family or spiritual advisor - practice positive thinking and self-talk    Why is this important?   When you are stressed, down or upset, your body reacts too.  For example, your blood pressure may get higher; you may have a headache or stomachache.  When your emotions get the best of you, your body's ability to fight off cold and flu gets weak.  These steps will help you manage your emotions.   Current Barriers:  Chronic Mental Health needs related to depression and need for housing Limited social support, Housing barriers, and Mental Health Concerns  Social Isolation ADL IADL limitations Suicidal Ideation/Homicidal Ideation: No  Clinical Social Work Goal(s):  Over the next 120 days, patient will work with LCSW  monthly by telephone or in person to reduce or manage symptoms related to depression and relapse prevention patient will work with [name] to address needs related to financial, food and transportation support. Patient wishes to offset medical and housing expenses with financial support as housing resources are very limited at this time  Interventions: Patient interviewed and appropriate assessments performed: brief mental health assessment Discussed plans with patient for ongoing care management follow up and provided patient with direct contact information for care management team Assisted patient/caregiver with obtaining information about health plan benefits Patient was educated on the Advance Directives process. Patient was encouraged to complete document with PCP.  Advance Directive education was provided to patient again on 07/08/22 and 08/19/22 Encouraged patient to consider a mental health provider for long term follow up and therapy/counseling and patient is now agreeable. Referral made  to [facility] for both counseling and psychiatry.  Patient reports recent agitation due to his inability to meet his daily mental and physical needs. [facility] LCSW provided emotional support and reflective listening.  Solution-Focused Strategies, Mindfulness or Relaxation Training, Active listening / Reflection utilized , Emotional Supportive Provided, and Verbalization of feelings encouraged  Patient declines any current substance use. Patient has a history of cocaine abuse. Relapse prevention education provided.  Patient reports that he has developed social anxiety. He admits that he gets irritable at times and will snap at his loved ones and then will experience guilt afterwards. Educated patient on coping methods to implement into his daily life to combat anxiety symptoms and stress. Patient denied any current suicidal or homicidal ideations. Encouraged patient to implement deep breathing and grounding exercises into his  daily routine due to ongoing anxiety and SOB.  Patient reports that he will start implementing appropriate self-care habits into his daily routine such as: drinking water, staying active around the house, taking his medications as prescribed, combating negative thoughts or emotions and staying connected with his support network. Positive reinforcement provided.  [facility] LCSW made referral for [facility] [name] and [facility] RNCM involvement. Message sent to entire team by [facility] LCSW. 07/27/22 LCSW update- Patient and [facility] LCSW contacted [facility] together and scheduled him for both psychiatry and counseling next month. Patient's counseling is virtual so he will not need transportation assistance for this appointment only for psychiatry. [place] LCSW will update [place] [name]. [facility] LCSW had patient physically write down his appointment reminders as he is having issues with his memory. Self-care education provided. [facility] LCSW 08/19/22 update- Patient and [facility] LCSW contacted [facility] together and left a message regarding his upcoming appointments as he has scheduled transportation for BOTH appointments even though of one those appointments is virtual. However, he has not filled out their intake paperwork and will need a ride to do this in person. [facility] LCSW encouraged patient to keep trying to reach [facility] in order to gain appropriate instructions on what to expect during his first visit. 08/27/22 update- Patient was able to successfully attend his psychiatry appointment yesterday. He was reminded of his upcoming appointments at [facility] for next month. He reports being in a lot of pain which has triggered his stress. Emotional support and resource education provided.  09/15/21: [name] completed telephone outreach with patient for utility assistance. Patient stated that he has a cut off notice for 09/25/21 and his bill is $700.00. [name] informed patient he can go to the [place] and can also [employer] [name] provided patient with  the telephone number for [employer]. Patient stated no other resources are needed at this time.   Patient Self Care Activities:  Ability for insight Agreement to start mental health treatment and to the self-care journey   Patient Coping Strengths:  Self Advocate Able to Communicate Effectively  Patient Self Care Deficits:  Lacks social connections  The following coping skill education was provided for stress relief and mental health management: "When your car dies or a deadline looms, how do you respond? Long-term, low-grade or acute stress takes a serious toll on your body and mind, so don't ignore feelings of constant tension. Stress is a natural part of life. However, too much stress can harm our health, especially if it continues every day. This is chronic stress and can put you at risk for heart problems like heart disease and depression. Understand what's happening inside your body and learn  simple coping skills to combat the negative impacts of everyday stressors.  Types of Stress There are two types of stress: Emotional - types of emotional stress are relationship problems, pressure at work, financial worries, experiencing discrimination or having a major life change. Physical - Examples of physical stress include being sick having pain, not sleeping well, recovery from an injury or having an alcohol and drug use disorder. Fight or Flight Sudden or ongoing stress activates your nervous system and floods your bloodstream with adrenaline and cortisol, two hormones that raise blood pressure, increase heart rate and spike blood sugar. These changes pitch your body into a fight or flight response. That enabled our ancestors to outrun saber-toothed tigers, and it's helpful today for situations like dodging a car accident. But most modern chronic stressors, such as finances or a challenging relationship, keep your body in that heightened state, which hurts your health. Effects of Too Much  Stress If constantly under stress, most of [place] will eventually start to function less well.  Multiple studies link chronic stress to a higher risk of heart disease, stroke, depression, weight gain, memory loss and even premature death, so it's important to recognize the warning signals. Talk to your doctor about ways to manage stress if you're experiencing any of these symptoms: Prolonged periods of poor sleep. Regular, severe headaches. Unexplained weight loss or gain. Feelings of isolation, withdrawal or worthlessness. Constant anger and irritability. Loss of interest in activities. Constant worrying or obsessive thinking. Excessive alcohol or drug use. Inability to concentrate.  10 Ways to Cope with Chronic Stress It's key to recognize stressful situations as they occur because it allows you to focus on managing how you react. We all need to know when to close our eyes and take a deep breath when we feel tension rising. Use these tips to prevent or reduce chronic stress. 1. Rebalance Work and Home All work and no play? If you're spending too much time at the office, intentionally put more dates in your calendar to enjoy time for fun, either alone or with others. 2. Get Regular Exercise Moving your body on a regular basis balances the nervous system and increases blood circulation, helping to flush out stress hormones. Even a daily 20-minute walk makes a difference. Any kind of exercise can lower stress and improve your mood ? just pick activities that you enjoy and make it a regular habit. 3. Eat Well and Limit Alcohol and Stimulants Alcohol, nicotine and caffeine may temporarily relieve stress but have negative health impacts and can make stress worse in the long run. Well-nourished bodies cope better, so start with a good breakfast, add more organic fruits and vegetables for a well-balanced diet, avoid processed foods and sugar, try herbal tea and drink more water. 4. Connect with  Supportive People Talking face to face with another person releases hormones that reduce stress. Lean on those good listeners in your life. 5. [place] Time Do you enjoy gardening, reading, listening to music or some other creative pursuit? Engage in activities that bring you pleasure and joy; research shows that reduces stress by almost half and lowers your heart rate, too. 6. Practice Meditation, Stress Reduction or Yoga Relaxation techniques activate a state of restfulness that counterbalances your body's fight-or-flight hormones. Even if this also means a 10-minute break in a long day: listen to music, read, go for a walk in nature, do a hobby, take a bath or spend time with a friend. Also consider doing a mindfulness  exercise or try a daily deep breathing or imagery practice. Deep Breathing Slow, calm and deep breathing can help you relax. Try these steps to focus on your breathing and repeat as needed. Find a comfortable position and close your eyes. Exhale and drop your shoulders. Breathe in through your nose; fill your lungs and then your belly. Think of relaxing your body, quieting your mind and becoming calm and peaceful. Breathe out slowly through your nose, relaxing your belly. Think of releasing tension, pain, worries or distress. Repeat steps three and four until you feel relaxed. Imagery This involves using your mind to excite the senses -- sound, vision, smell, taste and feeling. This may help ease your stress. Begin by getting comfortable and then do some slow breathing. Imagine a place you love being at. It could be somewhere from your childhood, somewhere you vacationed or just a place in your imagination. Feel how it is to be in the place you're imagining. Pay attention to the sounds, air, colors, and who is there with you. This is a place where you feel cared for and loved. All is well. You are safe. Take in all the smells, sounds, tastes and feelings. As you do, feel  your body being nourished and healed. Feel the calm that surrounds you. Breathe in all the good. Breathe out any discomfort or tension. 7. Sleep Enough If you get less than seven to eight hours of sleep, your body won't tolerate stress as well as it could. If stress keeps you up at night, address the cause, and add extra meditation into your day to make up for the lost z's. Try to get seven to nine hours of sleep each night. Make a regular bedtime schedule. Keep your room dark and cool. Try to avoid computers, TV, cell phones and tablets before bed. 8. Bond with Connections You Enjoy Go out for a coffee with a friend, chat with a neighbor, call a family member, visit with a clergy member, or even hang out with your pet. Clinical studies show that spending even a short time with a companion animal can cut anxiety levels almost in half. 9. Take a Vacation Getting away from it all can reset your stress tolerance by increasing your mental and emotional outlook, which makes you a happier, more productive person upon return. Leave your cellphone and laptop at home! 10. See a Counselor, Coach or Therapist If negative thoughts overwhelm your ability to make positive changes, it's time to seek professional help. Make an appointment today--your health and life are worth it."     24- Hour Availability:    [facility]  [address] [place], [place] [facility] [phone]   [facility] [employer] [phone]   [place]  [phone]    [place]  [phone] (after hours)   Therapeutic Alternative/Mobile Crisis   [phone]   [place] National Suicide Hotline  315 002 [address]) [place] 988   Call 911 or go to emergency room   [facility]  [phone]);  [place] and [employer]  [phone]); Lilla Shook, IXL, [place], [place], [place], [place]             07/08/2022   10:52 AM 05/11/2022    4:17 PM 12/21/2021   11:44 AM 08/20/2021    9:57 AM 05/14/2021   12:36 PM  Depression screen PHQ 2/9  Decreased Interest 2 1 2  0 0  Down,  Depressed, Hopeless 2 1 1  0 1  PHQ - 2 Score 4 2 3  0 1  Altered sleeping 3 3 3  3   Tired, decreased energy 3 3 2  1   Change in appetite 2 2 0  0  Feeling bad or failure about yourself  2 2 0  0  Trouble concentrating 2 2 1   0  Moving slowly or fidgety/restless 0 0 1  0  Suicidal thoughts 0 0 0  0  PHQ-9 Score 16 14 10  5   Difficult doing work/chores Very difficult      Follow up goal      Follow up:  Patient agrees to Care Plan and Follow-up.  Plan: The Managed Medicaid care management team will reach out to the patient again over the next 30 days.  Date/time of next scheduled Social Work care management/care coordination outreach:  09/15/22 at 9 am.  Eula Fried, [name], MSW, [place] Medicaid LCSW [email or web address] Phone: [phone]

## 2022-08-27 NOTE — Patient Instructions (Signed)
Visit Information  Mr. Noguera was given information about Medicaid Managed Care team care coordination services as a part of their Healthy [facility] Medicaid benefit. AMRIT NEVELLS verbally consented to engagement with the [facility] Managed Care team.   If you are experiencing a medical emergency, please call 911 or report to your local emergency department or urgent care.   If you have a non-emergency medical problem during routine business hours, please contact your provider's office and ask to speak with a nurse.   For questions related to your Healthy [facility] health plan, please call: [phone] or visit the homepage here: [email or web address]  If you would like to schedule transportation through your Healthy [facility] plan, please call the following number at least 2 days in advance of your appointment: [phone]  For information about your ride after you set it up, call Ride Assist at [phone]. Use this number to activate a Will Call pickup, or if your transportation is late for a scheduled pickup. Use this number, too, if you need to make a change or cancel a previously scheduled reservation.  If you need transportation services right away, call [phone]. The after-hours call center is staffed 24 hours to handle ride assistance and urgent reservation requests (including discharges) 365 days a year. Urgent trips include sick visits, hospital discharge requests and life-sustaining treatment.  Call the [facility] at [phone], at any time, 24 hours a day, 7 days a week. If you are in danger or need immediate medical attention call 911.  If you would like help to quit smoking, call 1-800-QUIT-NOW 512-414-5556) OR Espaol: 1-855-Djelo-Ya HD:1601594) o para ms informacin haga clic aqu or Text READY to 200-400 to register via text  Mr. Sotolongo - following are the goals we discussed in your visit today:    Goals Addressed   None      Social Worker will 09/27/22.   [name], [name], [place] Managed Medicaid Team  [phone]   Following is a copy of your plan of care:  There are no care plans that you recently modified to display for this patient.

## 2022-08-28 ENCOUNTER — Encounter (HOSPITAL_COMMUNITY): Payer: Self-pay | Admitting: Student

## 2022-09-02 ENCOUNTER — Ambulatory Visit: Payer: Medicaid Other | Admitting: Urology

## 2022-09-06 ENCOUNTER — Other Ambulatory Visit: Payer: Medicaid Other | Admitting: *Deleted

## 2022-09-06 NOTE — Patient Outreach (Signed)
Medicaid Managed Care   Nurse Care Manager Note  09/06/2022 Name:  Chris [name] MRN:  [id number] DOB:  30-Jul-1960  Chris [name] is an 62 y.o. year old male who is [name] primary patient of Chris Pier, MD.  The [facility] Managed Care Coordination team was consulted for assistance with:    CHF pain  Chris [name] was given information about Medicaid Managed Care Coordination team services today. Chris [name] Patient agreed to services and verbal consent obtained.  Engaged with patient by telephone for follow up visit in response to provider referral for case management and/or care coordination services.   Assessments/Interventions:  Review of past medical history, allergies, medications, health status, including review of consultants reports, laboratory and other test data, was performed as part of comprehensive evaluation and provision of chronic care management services.  SDOH (Social Determinants of Health) assessments and interventions performed: SDOH Interventions    Flowsheet Row Patient Outreach Telephone from 08/27/2022 in [place] Patient Outreach Telephone from 08/19/2022 in [place] Patient Outreach Telephone from 07/27/2022 in [place] Patient Outreach Telephone from 07/23/2022 in [place] Patient Outreach Telephone from 07/08/2022 in [place] ED to Hosp-Admission (Discharged) from 04/19/2022 in [place] [facility]  SDOH Interventions        Housing Interventions -- -- -- Intervention Not Indicated -- --  Transportation Interventions -- -- -- Other (Comment)  Dispensing optician arranged by Chris [name] for upcoming appointments] -- Patient Resources Tax adviser)  Utilities Interventions -- -- -- Other (Comment)  [MM Social WOrk team consulted] -- --  Depression Interventions/Treatment  --  -- -- -- Referral to Psychiatry --  Financial Strain Interventions -- -- -- Other (Comment)  [MM Social Work Support] -- --  Stress Interventions [place], Provide Counseling Provide Counseling, [place] [facility], Provide Counseling --  Social Connections Interventions -- -- -- Other (Comment)  [SW team referral] -- --       Care Plan  No Known Allergies  Medications Reviewed Today     Reviewed by Chris Montane, RN (Registered Nurse) on 09/06/22 at [place] List Status: <None>   Medication Order Taking? Sig Documenting Provider Last Dose Status Informant  Accu-Chek Softclix Lancets lancets [id number] Yes Use as instructed Chris Pier, MD Taking Active Self  acetaminophen (TYLENOL) 500 MG tablet [id number] Yes Take 500-1,000 mg by mouth every 6 (six) hours as needed (pain.). [provider] Taking Active Self  albuterol (PROVENTIL) (2.5 MG/3ML) 0.083% nebulizer solution [id number] Yes Take 3 mLs (2.5 mg total) by nebulization every 4 (four) hours as needed for wheezing or shortness of breath. [name], MD Taking Active Self  albuterol (VENTOLIN HFA) 108 (90 Base) MCG/ACT inhaler [id number] Yes Inhale 2 puffs into the lungs every 6 (six) hours as needed for wheezing or shortness of breath. [name], MD Taking Active Self  allopurinol (ZYLOPRIM) 100 MG tablet [id number] Yes TAKE 2 TABLETS (200 MG TOTAL) BY MOUTH DAILY. Chris Pier, MD Taking Active Self  apixaban (ELIQUIS) 5 MG TABS tablet [id number] Yes Take 1 tablet (5 mg total) by mouth 2 (two) times daily. [name], MD Taking Active            Med Note Chris [name], Chris [name]   Mon Sep 06, 2022 10:58 AM)  atorvastatin (LIPITOR) 40 MG tablet [id number] Yes Take 1 tablet (40 mg total) by mouth daily. Alisa Graff, FNP Taking Active Self  Blood Glucose  Monitoring Suppl (ACCU-CHEK GUIDE) w/Device [name] [id number] Yes Use as directed Chris Pier, MD Taking Active Self  budesonide-formoterol [facility]) 160-4.5 MCG/ACT inhaler [id number] Yes Inhale 2 puffs into the lungs 2 (two) times daily. [name], MD Taking Active Self  carvedilol (COREG) 6.25 MG tablet [id number] Yes Take 1 tablet (6.25 mg total) by mouth 2 (two) times daily. Alisa Graff, FNP Taking Active Self  colchicine 0.6 MG tablet [id number] Yes Take 0.5 tablets (0.3 mg total) by mouth three times [name] week. Chris Pier, MD Taking Active   dapagliflozin propanediol (FARXIGA) 10 MG TABS tablet [id number] Yes Take 1 tablet (10 mg total) by mouth daily. Alisa Graff, FNP Taking Active Self  DULoxetine (CYMBALTA) 30 MG capsule [id number] No Take 1 capsule (30 mg total) by mouth daily.  Patient not taking: Reported on 09/06/2022   France Ravens, MD Not Taking Active   glucose blood (ACCU-CHEK GUIDE) test strip [id number] Yes Use as directed to check blood sugar 1-2 times [name] day Chris Pier, MD Taking Active Self  isosorbide-hydrALAZINE (BIDIL) 20-37.5 MG tablet [id number] Yes Take 1 tablet by mouth 3 (three) times daily. [name], MD Taking Active   methylPREDNISolone (MEDROL DOSEPAK) 4 MG TBPK tablet [id number] No Use as directed  Patient not taking: Reported on 09/06/2022   [name], PA-C Not Taking Active Self           Med Note Clerance Lav   Fri Jul 23, 2022  2:15 PM) Patient reports he completed the dose pack as prescribed    Discontinued 07/11/20 0943 (Discontinued by provider)   montelukast (SINGULAIR) 10 MG tablet [id number] Yes TAKE 1 TABLET (10 MG TOTAL) BY MOUTH AT BEDTIME. [name], MD Taking Active Self  oxyCODONE-acetaminophen (PERCOCET) 5-325 MG tablet [id number] No Take 1 tablet by mouth every 4 (four) hours as needed for severe pain.  Patient not taking: Reported on 09/06/2022   Harvest Dark, MD Not Taking Active   potassium  chloride (KLOR-CON M) 10 MEQ tablet [id number] Yes Take 2 tablets (20 mEq total) by mouth daily. [name], MD Taking Active   Tiotropium Bromide Monohydrate (SPIRIVA RESPIMAT) 2.5 MCG/ACT AERS [id number] Yes Inhale 2 puffs into the lungs daily. [name], MD Taking Active Self  torsemide (DEMADEX) 20 MG tablet [id number] Yes Take by mouth daily. Take 40mg  every other day alternating with 20mg  every other day. [provider] Taking Active   traZODone (DESYREL) 50 MG tablet [id number] No Take 1-2 tablets (50-100 mg total) by mouth at bedtime.  Patient not taking: Reported on 09/06/2022   France Ravens, MD Not Taking Active             Patient Active Problem List   Diagnosis Date Noted   MDD (major depressive disorder), recurrent episode, moderate ([place]) 08/26/2022   Stage 3b chronic kidney disease ([place]) 08/09/2022   Hypertrophic cardiomyopathy ([place]) 08/09/2022   History of renal cell cancer 08/09/2022   Chronic heart failure with preserved ejection fraction (HFpEF) ([place]) 07/13/2022   Agitation 04/23/2022   RSV (respiratory syncytial virus pneumonia) 04/22/2022   Obesity (BMI 30-39.9) 04/21/2022   COPD exacerbation ([place]) 04/19/2022   Dyslipidemia 04/19/2022   Gout 04/19/2022   Right kidney mass 05/14/2021   CHF exacerbation ([place]) 04/14/2021   Chest pain 04/14/2021   Syncope 04/14/2021  Left-sided weakness 10/28/2020   Typical atrial flutter (HCC)    CHF (congestive heart failure) ([place]) 07/04/2020   Acute exacerbation of CHF (congestive heart failure) ([place]) 06/16/2020   Influenza vaccine refused 05/06/2020   Acute decompensated heart failure ([place]) 05/04/2020   Illiteracy 05/04/2020   Type 2 diabetes mellitus with stage 3 chronic kidney disease ([place]) 12/25/2019   Elevated troponin I level 10/26/2019   History of gout 02/01/2019   Seasonal allergic rhinitis due to pollen 02/01/2019   Tobacco dependence 11/30/2018   Microscopic hematuria 11/30/2018   Depression  11/30/2018   Difficulty controlling anger 11/30/2018   COPD (chronic obstructive pulmonary disease) ([place])    CKD (chronic kidney disease) stage 3, GFR 30-59 ml/min (HCC) 08/10/2018   Recurrent epistaxis 04/21/2018   Mixed hyperlipidemia 07/28/2017   Essential hypertension [id number]   Chronic systolic heart failure ([place]) 10/25/2014   Cocaine abuse ([place]) 02/20/2013   Cannabis abuse 02/20/2013   Back pain, chronic 02/20/2013    Conditions to be addressed/monitored per PCP order:  CHF and pain  Care Plan : RN Care Manager Plan of Care  Updates made by Chris Montane, RN since 09/06/2022 12:00 AM     Problem: Health Management Needs Related to Heart Failure and Chronic Pain   Priority: High  Note:   Current Barriers:    RNCM Clinical Goal(s):  Patient will take all medications exactly as prescribed and will call provider for medication related questions as evidenced by medication review and patient interview demonstrate Ongoing adherence to prescribed treatment plan for CHF as evidenced by medication review continue to work with RN Care Manager to address care management and care coordination needs related to  CHF as evidenced by adherence to CM Team Scheduled appointments work with Education officer, museum to address  related to the management of Limited social support, Transportation, and Lack of essential utilities - light/power* related to the management of CHF as evidenced by review of EMR and patient or Education officer, museum report through collaboration with Consulting civil engineer, provider, and care team.   Interventions: Collaboration with LCSW/[name]/Care Guide and Heart Failure RN Inter-disciplinary care team collaboration (see longitudinal plan of care) Evaluation of current treatment plan related to  self management and patient's adherence to plan as established by provider   Heart Failure Interventions:  (Status:  New goal.) Long Term Goal Discussed importance of daily weight and advised patient to  weigh and record daily Reviewed role of diuretics in prevention of fluid overload and management of heart failure; Discussed the importance of keeping all appointments with provider Assessed social determinant of health barriers  Discussed importance of self assessment reporting of swelling, increased shortness of breath, chest pain, dizziness, or other new or worsened symptom  Pain Interventions:  (Status:  New goal.) Short Term Goal Pain assessment performed Medications reviewed Reviewed provider established plan for pain management Discussed importance of adherence to all scheduled medical appointments Counseled on the importance of reporting any/all new or changed pain symptoms or management strategies to pain management provider Reviewed with patient prescribed pharmacological and nonpharmacological pain relief strategies Assessed social determinant of health barriers Advised patient that the CM team would discuss pain management needs with provider and assist with connection to pain management provider  Patient Goals/Self-Care Activities: Take all medications as prescribed Attend all scheduled provider appointments Call provider office for new concerns or questions  Work with the social worker to address care coordination needs and will continue to work with the clinical team to address  health care and disease management related needs call 1-800-273-TALK (toll free, 24 hour hotline) if experiencing [name] Mental [facility] or Behavioral Health Crisis   Follow Up Plan:  Telephone follow up appointment with care management team member scheduled for:  07/27/22 10am      Long-Range Goal: Independent self health management of Heart Failure and Chronic Pain   Start Date: 07/23/2022  Expected End Date: 10/21/2022  Recent Progress: On track  Priority: High  Note:   Current Barriers:  Knowledge Deficits related to plan of care for management of CHF and Chronic Pain Care Coordination needs related to  Transportation  Non-adherence to prescribed medication regimen Difficulty obtaining medications  RNCM Clinical Goal(s):  Patient will take all medications exactly as prescribed and will call provider for medication related questions as evidenced by medication review and patient interview demonstrate Ongoing adherence to prescribed treatment plan for CHF as evidenced by medication review continue to work with RN Care Manager to address care management and care coordination needs related to  CHF as evidenced by adherence to CM Team Scheduled appointments work with Education officer, museum to address  related to the management of Limited social support, Transportation, and Lack of essential utilities - light/power* related to the management of CHF as evidenced by review of EMR and patient or Education officer, museum report through collaboration with Consulting civil engineer, provider, and care team.   Interventions: Inter-disciplinary care team collaboration (see longitudinal plan of care) Evaluation of current treatment plan related to  self management and patient's adherence to plan as established by provider Provided patient with [facility] [phone] to call and reschedule missed appointment   Heart Failure Interventions:  (Status:  ongoing.) Long Term Goal Discussed importance of daily weight and advised patient to weigh and record daily Reviewed role of diuretics in prevention of fluid overload and management of heart failure; Discussed the importance of keeping all appointments with provider Assessed social determinant of health barriers  Discussed importance of self assessment reporting of swelling, increased shortness of breath, chest pain, dizziness, or other new or worsened symptom Reviewed upcoming appointment with Dr. Aundra Dubin on 09/28/22-patient planning to arrange transportation Advised patient to request refills on any needed medications  Pain Interventions:  (Status:  ongoing.) Short Term Goal Pain  assessment performed Medications reviewed: discussed Cymbalta-patient needs to pick up new dose as prescribed by Psychiatry Reviewed provider established plan for pain management Discussed importance of adherence to all scheduled medical appointments Counseled on the importance of reporting any/all new or changed pain symptoms or management strategies to pain management provider Reviewed with patient prescribed pharmacological and nonpharmacological pain relief strategies Assessed social determinant of health barriers Discussed the benefits of cymbalta  Patient Goals/Self-Care Activities: Take all medications as prescribed Attend all scheduled provider appointments Call provider office for new concerns or questions  Work with the social worker to address care coordination needs and will continue to work with the clinical team to address health care and disease management related needs call 1-800-273-TALK (toll free, 24 hour hotline) if experiencing [name] Mental [facility] or [place] Crisis   Follow Up Plan:  Telephone follow up appointment with care management team member scheduled for:  07/27/22 10am      Follow Up:  Patient agrees to Care Plan and Follow-up.  Plan: The Managed Medicaid care management team will reach out to the patient again over the next 30 days.  Date/time of next scheduled RN care management/care coordination outreach:  10/07/22 @ 11:15am  [name]  RN, [facility] RN Care Coordinator [phone]

## 2022-09-06 NOTE — Patient Instructions (Signed)
Visit Information  Chris [name] was given information about Medicaid Managed Care team care coordination services as a part of their Healthy [facility] Medicaid benefit. Chris [name] verbally consented to engagement with the [facility] Managed Care team.   If you are experiencing a medical emergency, please call 911 or report to your local emergency department or urgent care.   If you have a non-emergency medical problem during routine business hours, please contact your provider's office and ask to speak with a nurse.   For questions related to your Healthy [facility] health plan, please call: [phone] or visit the homepage here: [email or web address]  If you would like to schedule transportation through your Healthy [facility] plan, please call the following number at least 2 days in advance of your appointment: [phone]  For information about your ride after you set it up, call Ride Assist at [phone]. Use this number to activate a Will Call pickup, or if your transportation is late for a scheduled pickup. Use this number, too, if you need to make a change or cancel a previously scheduled reservation.  If you need transportation services right away, call [phone]. The after-hours call center is staffed 24 hours to handle ride assistance and urgent reservation requests (including discharges) 365 days a year. Urgent trips include sick visits, hospital discharge requests and life-sustaining treatment.  Call the [place] at [phone], at any time, 24 hours a day, 7 days a week. If you are in danger or need immediate medical attention call 911.  If you would like help to quit smoking, call 1-800-QUIT-NOW 539-690-0758) OR Espaol: 1-855-Djelo-Ya QO:409462) o para ms informacin haga clic aqu or Text READY to 200-400 to register via text  Chris [name],   Please see education materials related to HF provided by  MyChart link.  Patient verbalizes understanding of instructions and care plan provided today and agrees to view in [place]. Active MyChart status and patient understanding of how to access instructions and care plan via MyChart confirmed with patient.     Telephone follow up appointment with Managed Medicaid care management team member scheduled for:10/07/22 @ 11:15am  [name] RN, [facility] RN Care Coordinator [phone]   Following is a copy of your plan of care:  Care Plan : RN Care Manager Plan of Care  Updates made by [name], RN since 09/06/2022 12:00 AM     Problem: Health Management Needs Related to Heart Failure and Chronic Pain   Priority: High  Note:   Current Barriers:    RNCM Clinical Goal(s):  Patient will take all medications exactly as prescribed and will call provider for medication related questions as evidenced by medication review and patient interview demonstrate Ongoing adherence to prescribed treatment plan for CHF as evidenced by medication review continue to work with RN Care Manager to address care management and care coordination needs related to  CHF as evidenced by adherence to CM Team Scheduled appointments work with social worker to address  related to the management of Limited social support, Transportation, and Lack of essential utilities - light/power* related to the management of CHF as evidenced by review of EMR and patient or Education officer, museum report through collaboration with Consulting civil engineer, provider, and care team.   Interventions: Collaboration with LCSW/[name]/Care Guide and Heart Failure RN Inter-disciplinary care team collaboration (see longitudinal plan of care) Evaluation of current treatment plan related to  self management and patient's adherence to plan as established by  provider   Heart Failure Interventions:  (Status:  New goal.) Long Term Goal Discussed importance of daily weight and advised patient  to weigh and record daily Reviewed role of diuretics in prevention of fluid overload and management of heart failure; Discussed the importance of keeping all appointments with provider Assessed social determinant of health barriers  Discussed importance of self assessment reporting of swelling, increased shortness of breath, chest pain, dizziness, or other new or worsened symptom  Pain Interventions:  (Status:  New goal.) Short Term Goal Pain assessment performed Medications reviewed Reviewed provider established plan for pain management Discussed importance of adherence to all scheduled medical appointments Counseled on the importance of reporting any/all new or changed pain symptoms or management strategies to pain management provider Reviewed with patient prescribed pharmacological and nonpharmacological pain relief strategies Assessed social determinant of health barriers Advised patient that the CM team would discuss pain management needs with provider and assist with connection to pain management provider  Patient Goals/Self-Care Activities: Take all medications as prescribed Attend all scheduled provider appointments Call provider office for new concerns or questions  Work with the social worker to address care coordination needs and will continue to work with the clinical team to address health care and disease management related needs call 1-800-273-TALK (toll free, 24 hour hotline) if experiencing a Mental Health or Behavioral Health Crisis   Follow Up Plan:  Telephone follow up appointment with care management team member scheduled for:  07/27/22 10am      Long-Range Goal: Independent self health management of Heart Failure and Chronic Pain   Start Date: 07/23/2022  Expected End Date: 10/21/2022  Recent Progress: On track  Priority: High  Note:   Current Barriers:  Knowledge Deficits related to plan of care for management of CHF and Chronic Pain Care Coordination needs related  to Transportation  Non-adherence to prescribed medication regimen Difficulty obtaining medications  RNCM Clinical Goal(s):  Patient will take all medications exactly as prescribed and will call provider for medication related questions as evidenced by medication review and patient interview demonstrate Ongoing adherence to prescribed treatment plan for CHF as evidenced by medication review continue to work with RN Care Manager to address care management and care coordination needs related to  CHF as evidenced by adherence to CM Team Scheduled appointments work with Education officer, museum to address  related to the management of Limited social support, Transportation, and Lack of essential utilities - light/power* related to the management of CHF as evidenced by review of EMR and patient or Education officer, museum report through collaboration with Consulting civil engineer, provider, and care team.   Interventions: Inter-disciplinary care team collaboration (see longitudinal plan of care) Evaluation of current treatment plan related to  self management and patient's adherence to plan as established by provider Provided patient with [facility] [phone] to call and reschedule missed appointment   Heart Failure Interventions:  (Status:  ongoing.) Long Term Goal Discussed importance of daily weight and advised patient to weigh and record daily Reviewed role of diuretics in prevention of fluid overload and management of heart failure; Discussed the importance of keeping all appointments with provider Assessed social determinant of health barriers  Discussed importance of self assessment reporting of swelling, increased shortness of breath, chest pain, dizziness, or other new or worsened symptom Reviewed upcoming appointment with Dr. Aundra Dubin on 09/28/22-patient planning to arrange transportation Advised patient to request refills on any needed medications  Pain Interventions:  (Status:  ongoing.) Short Term  Goal Pain  assessment performed Medications reviewed: discussed Cymbalta-patient needs to pick up new dose as prescribed by Psychiatry Reviewed provider established plan for pain management Discussed importance of adherence to all scheduled medical appointments Counseled on the importance of reporting any/all new or changed pain symptoms or management strategies to pain management provider Reviewed with patient prescribed pharmacological and nonpharmacological pain relief strategies Assessed social determinant of health barriers Discussed the benefits of cymbalta  Patient Goals/Self-Care Activities: Take all medications as prescribed Attend all scheduled provider appointments Call provider office for new concerns or questions  Work with the social worker to address care coordination needs and will continue to work with the clinical team to address health care and disease management related needs call 1-800-273-TALK (toll free, 24 hour hotline) if experiencing a Mental Health or [place] Crisis   Follow Up Plan:  Telephone follow up appointment with care management team member scheduled for:  07/27/22 10am

## 2022-09-09 ENCOUNTER — Other Ambulatory Visit: Payer: Self-pay

## 2022-09-15 ENCOUNTER — Other Ambulatory Visit: Payer: Medicaid Other | Admitting: Licensed Clinical Social Worker

## 2022-09-15 NOTE — Patient Outreach (Signed)
  Medicaid Managed Care   Unsuccessful Attempt Note   09/15/2022 Name: Chris [name] MRN: [id number] DOB: [birth year]  Referred by: [name], MD Reason for referral : No chief complaint on file.   {MANAGED MEDICAID OUTREACH UNSUCCESSFUL:[id number]}   Follow Up Plan: {MANAGED MEDICAID FOLLOW UP PLAN:[id number]}    ***SIGNATURE

## 2022-09-23 ENCOUNTER — Ambulatory Visit (INDEPENDENT_AMBULATORY_CARE_PROVIDER_SITE_OTHER): Payer: Medicaid Other | Admitting: Mental Health

## 2022-09-23 DIAGNOSIS — F331 Major depressive disorder, recurrent, moderate: Secondary | ICD-10-CM

## 2022-09-23 DIAGNOSIS — F4310 Post-traumatic stress disorder, unspecified: Secondary | ICD-10-CM

## 2022-09-24 ENCOUNTER — Other Ambulatory Visit: Payer: Medicaid Other | Admitting: Licensed Clinical Social Worker
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
## 2022-09-24 NOTE — Patient Outreach (Signed)
error 

## 2022-09-27 ENCOUNTER — Encounter: Payer: Self-pay | Admitting: Urology

## 2022-09-27 ENCOUNTER — Ambulatory Visit (INDEPENDENT_AMBULATORY_CARE_PROVIDER_SITE_OTHER): Payer: Medicaid Other | Admitting: Urology

## 2022-09-27 ENCOUNTER — Other Ambulatory Visit: Payer: Self-pay | Admitting: Interventional Radiology

## 2022-09-27 ENCOUNTER — Other Ambulatory Visit: Payer: Medicaid Other

## 2022-09-27 VITALS — BP 172/88 | HR 88 | Ht 72.0 in | Wt 270.0 lb

## 2022-09-27 DIAGNOSIS — C641 Malignant neoplasm of right kidney, except renal pelvis: Secondary | ICD-10-CM

## 2022-09-27 NOTE — Patient Outreach (Addendum)
Medicaid Managed Care Social Work Note  09/24/2022 Name:  Chris [name] MRN:  [id number] DOB:  25-Aug-1960  Chris [name] is an 62 y.o. year old male who is a primary patient of Chris [name].  The Medicaid Managed Care Coordination team was consulted for assistance with:  Mental Health Counseling and Resources  Chris [name] was given information about Medicaid Managed Care Coordination team services today. Chris [name] Patient agreed to services and verbal consent obtained.  Engaged with patient  for by telephone forfollow up visit in response to referral for case management and/or care coordination services.   Assessments/Interventions:  Review of past medical history, allergies, medications, health status, including review of consultants reports, laboratory and other test data, was performed as part of comprehensive evaluation and provision of chronic care management services.  SDOH: (Social Determinant of Health) assessments and interventions performed: SDOH Interventions    Flowsheet Row Patient Outreach Telephone from 08/27/2022 in [place] POPULATION HEALTH DEPARTMENT Patient Outreach Telephone from 08/19/2022 in [place] POPULATION HEALTH DEPARTMENT Patient Outreach Telephone from 07/27/2022 in [place] POPULATION HEALTH DEPARTMENT Patient Outreach Telephone from 07/23/2022 in [place] POPULATION HEALTH DEPARTMENT Patient Outreach Telephone from 07/08/2022 in  POPULATION HEALTH DEPARTMENT ED to Hosp-Admission (Discharged) from 04/19/2022 in [facility]U  SDOH Interventions        Housing Interventions -- -- -- Intervention Not Indicated -- --  Transportation Interventions -- -- -- Other (Comment)  Cabin crew arranged by MM Care Guide for upcoming appointments] -- Patient Resources Dietitian)  Utilities Interventions -- -- -- Other (Comment)  [MM Social WOrk team consulted] -- --  Depression  Interventions/Treatment  -- -- -- -- Referral to Psychiatry --  Financial Strain Interventions -- -- -- Other (Comment)  [MM Social Work Support] -- --  Stress Interventions Offered [employer], Provide Counseling Offered [employer], Provide Counseling Provide Counseling, Offered [employer] [facility] -- [employer], Provide Counseling --  Social Connections Interventions -- -- -- Other (Comment)  [SW team referral] -- --       Advanced Directives Status:  See Care Plan for related entries.  Care Plan                 No Known Allergies  Medications Reviewed Today     Reviewed by [name], RN (Registered Nurse) on 09/06/22 at 1059  Med List Status: <None>   Medication Order Taking? Sig Documenting Provider Last Dose Status Informant  Accu-Chek Softclix Lancets lancets [id number] Yes Use as instructed Chris [name] Taking Active Self  acetaminophen (TYLENOL) 500 MG tablet [id number] Yes Take 500-1,000 mg by mouth every 6 (six) hours as needed (pain.). Provider, Historical, [name] Taking Active Self  albuterol (PROVENTIL) (2.5 MG/3ML) 0.083% nebulizer solution [id number] Yes Take 3 mLs (2.5 mg total) by nebulization every 4 (four) hours as needed for wheezing or shortness of breath. Alford Highland, [name] Taking Active Self  albuterol (VENTOLIN HFA) 108 (90 Base) MCG/ACT inhaler [id number] Yes Inhale 2 puffs into the lungs every 6 (six) hours as needed for wheezing or shortness of breath. Alford Highland, [name] Taking Active Self  allopurinol (ZYLOPRIM) 100 MG tablet [id number] Yes TAKE 2 TABLETS (200 MG TOTAL) BY MOUTH DAILY. Chris [name] Taking Active Self  apixaban (ELIQUIS) 5 MG TABS tablet [id number] Yes Take 1 tablet (5 mg total) by mouth 2 (two) times daily. [name], [name] Taking Active  Med Note (ROBB, MELANIE A   Mon Sep 06, 2022 10:58 AM)    atorvastatin (LIPITOR) 40 MG tablet [id number]  Yes Take 1 tablet (40 mg total) by mouth daily. Delma Freeze, FNP Taking Active Self  Blood Glucose Monitoring Suppl (ACCU-CHEK GUIDE) w/Device Andria Rhein [id number] Yes Use as directed Chris [name] Taking Active Self  budesonide-formoterol [facility]) 160-4.5 MCG/ACT inhaler [id number] Yes Inhale 2 puffs into the lungs 2 (two) times daily. Alford Highland, [name] Taking Active Self  carvedilol (COREG) 6.25 MG tablet [id number] Yes Take 1 tablet (6.25 mg total) by mouth 2 (two) times daily. Delma Freeze, FNP Taking Active Self  colchicine 0.6 MG tablet [id number] Yes Take 0.5 tablets (0.3 mg total) by mouth three times a week. Chris [name] Taking Active   dapagliflozin propanediol (FARXIGA) 10 MG TABS tablet [id number] Yes Take 1 tablet (10 mg total) by mouth daily. Delma Freeze, FNP Taking Active Self  DULoxetine (CYMBALTA) 30 MG capsule [id number] No Take 1 capsule (30 mg total) by mouth daily.  Patient not taking: Reported on 09/06/2022   Park Pope, [name] Not Taking Active   glucose blood (ACCU-CHEK GUIDE) test strip [id number] Yes Use as directed to check blood sugar 1-2 times a day Chris [name] Taking Active Self  isosorbide-hydrALAZINE (BIDIL) 20-37.5 MG tablet [id number] Yes Take 1 tablet by mouth 3 (three) times daily. [name], [name] Taking Active   methylPREDNISolone (MEDROL DOSEPAK) 4 MG TBPK tablet [id number] No Use as directed  Patient not taking: Reported on 09/06/2022   Arthor Captain, PA-C Not Taking Active Self           Med Note Nunzio Cory   Fri Jul 23, 2022  2:15 PM) Patient reports he completed the dose pack as prescribed    Discontinued 07/11/20 0943 (Discontinued by provider)   montelukast (SINGULAIR) 10 MG tablet [id number] Yes TAKE 1 TABLET (10 MG TOTAL) BY MOUTH AT BEDTIME. Alford Highland, [name] Taking Active Self  oxyCODONE-acetaminophen (PERCOCET) 5-325 MG tablet [id number] No Take 1 tablet by mouth every 4 (four) hours as needed for severe pain.   Patient not taking: Reported on 09/06/2022   Minna Antis, [name] Not Taking Active   potassium chloride (KLOR-CON M) 10 MEQ tablet [id number] Yes Take 2 tablets (20 mEq total) by mouth daily. Laurey Morale, [name] Taking Active   Tiotropium Bromide Monohydrate (SPIRIVA RESPIMAT) 2.5 MCG/ACT AERS [id number] Yes Inhale 2 puffs into the lungs daily. Alford Highland, [name] Taking Active Self  torsemide (DEMADEX) 20 MG tablet [id number] Yes Take by mouth daily. Take 40mg  every other day alternating with 20mg  every other day. Provider, Historical, [name] Taking Active   traZODone (DESYREL) 50 MG tablet [id number] No Take 1-2 tablets (50-100 mg total) by mouth at bedtime.  Patient not taking: Reported on 09/06/2022   Park Pope, [name] Not Taking Active             Patient Active Problem List   Diagnosis Date Noted   Post traumatic stress disorder 09/23/2022   MDD (major depressive disorder), recurrent episode, moderate 08/26/2022   Stage 3b chronic kidney disease 08/09/2022   Hypertrophic cardiomyopathy 08/09/2022   History of renal cell cancer 08/09/2022   Chronic heart failure with preserved ejection fraction (HFpEF) 07/13/2022   Agitation 04/23/2022   RSV (respiratory syncytial virus pneumonia) 04/22/2022   Obesity (BMI 30-39.9) 04/21/2022   COPD exacerbation 04/19/2022   Dyslipidemia 04/19/2022   Gout 04/19/2022   Right kidney mass  05/14/2021   CHF exacerbation 04/14/2021   Chest pain 04/14/2021   Syncope 04/14/2021   Left-sided weakness 10/28/2020   Typical atrial flutter    CHF (congestive heart failure) 07/04/2020   Acute exacerbation of CHF (congestive heart failure) 06/16/2020   Influenza vaccine refused 05/06/2020   Acute decompensated heart failure 05/04/2020   Illiteracy 05/04/2020   Type 2 diabetes mellitus with stage 3 chronic kidney disease 12/25/2019   Elevated troponin I level 10/26/2019   History of gout 02/01/2019   Seasonal allergic rhinitis due to pollen 02/01/2019    Tobacco dependence 11/30/2018   Microscopic hematuria 11/30/2018   Depression 11/30/2018   Difficulty controlling anger 11/30/2018   COPD (chronic obstructive pulmonary disease)    CKD (chronic kidney disease) stage 3, GFR 30-59 ml/min 08/10/2018   Recurrent epistaxis 04/21/2018   Mixed hyperlipidemia 07/28/2017   Essential hypertension 07/28/2017   Chronic systolic heart failure 10/25/2014   Cocaine abuse 02/20/2013   Cannabis abuse 02/20/2013   Back pain, chronic 02/20/2013    Conditions to be addressed/monitored per PCP order:  Depression  Care Plan : General Social Work (Adult)  Updates made by Gustavus Bryant, LCSW since 09/27/2022 12:00 AM     Problem: Depression Identification (Depression)      Long-Range Goal: To increase my self care and gain mental health support   Start Date: 07/08/2022  Priority: High  Note:   Timeframe:  Long-Range Goal Priority:  High Start Date:   07/08/22                    Expected End Date:ongoing    Follow Up Date- 10/25/22 at 11 am   - begin personal counseling - call and visit an old friend - check out volunteer opportunities - join a support group - laugh; watch a funny movie or comedian - learn and use visualization or guided imagery - perform a random act of kindness - practice relaxation or meditation daily - start or continue a personal journal - talk about feelings with a friend, family or spiritual advisor - practice positive thinking and self-talk    Why is this important?   When you are stressed, down or upset, your body reacts too.  For example, your blood pressure may get higher; you may have a headache or stomachache.  When your emotions get the best of you, your body's ability to fight off cold and flu gets weak.  These steps will help you manage your emotions.   Current Barriers:  Chronic Mental Health needs related to depression and need for housing Limited social support, Housing barriers, and Mental Health  Concerns  Social Isolation ADL IADL limitations Suicidal Ideation/Homicidal Ideation: No  Clinical Social Work Goal(s):  Over the next 120 days, patient will work with LCSW monthly by telephone or in person to reduce or manage symptoms related to depression and relapse prevention patient will work with [name] to address needs related to financial, food and transportation support. Patient wishes to offset medical and housing expenses with financial support as housing resources are very limited at this time  Interventions: Patient interviewed and appropriate assessments performed: brief mental health assessment Discussed plans with patient for ongoing care management follow up and provided patient with direct contact information for care management team Assisted patient/caregiver with obtaining information about health plan benefits Patient was educated on the Advance Directives process. Patient was encouraged to complete document with PCP.  Advance Directive education was provided to patient again on  07/08/22 and 08/19/22 Encouraged patient to consider a mental health provider for long term follow up and therapy/counseling and patient is now agreeable. Referral made to [facility] for both counseling and psychiatry.  Patient reports recent agitation due to his inability to meet his daily mental and physical needs. [facility] LCSW provided emotional support and reflective listening.  Solution-Focused Strategies, Mindfulness or Relaxation Training, Active listening / Reflection utilized , Emotional Supportive Provided, and Verbalization of feelings encouraged  Patient declines any current substance use. Patient has a history of cocaine abuse. Relapse prevention education provided.  Patient reports that he has developed social anxiety. He admits that he gets irritable at times and will snap at his loved ones and then will experience guilt afterwards. Educated patient on coping methods to implement into his daily life to  combat anxiety symptoms and stress. Patient denied any current suicidal or homicidal ideations. Encouraged patient to implement deep breathing and grounding exercises into his daily routine due to ongoing anxiety and SOB.  Patient reports that he will start implementing appropriate self-care habits into his daily routine such as: drinking water, staying active around the house, taking his medications as prescribed, combating negative thoughts or emotions and staying connected with his support network. Positive reinforcement provided.  [facility] LCSW made referral for [facility] [name] and [facility] RNCM involvement. Message sent to entire team by [facility] LCSW. 07/27/22 LCSW update- Patient and [facility] LCSW contacted [facility] together and scheduled him for both psychiatry and counseling next month. Patient's counseling is virtual so he will not need transportation assistance for this appointment only for psychiatry. [facility] LCSW will update [facility] [name]. [facility] LCSW had patient physically write down his appointment reminders as he is having issues with his memory. Self-care education provided. [facility] LCSW 08/19/22 update- Patient and [facility] LCSW contacted [facility] together and left a message regarding his upcoming appointments as he has scheduled transportation for BOTH appointments even though of one those appointments is virtual. However, he has not filled out their intake paperwork and will need a ride to do this in person. [facility] LCSW encouraged patient to keep trying to reach [facility] in order to gain appropriate instructions on what to expect during his first visit. 08/27/22 update- Patient was able to successfully attend his psychiatry appointment yesterday. He was reminded of his upcoming appointments at [facility] for next month. He reports being in a lot of pain which has triggered his stress. Emotional support and resource education provided. LCSW update- Patient successfully went to [facility] for therapy and has a scheduled psychiatry visit next week. Patient reports that  self care and stress management tools are not effectively working for him due to the amount of pain he is in. Care coordination completed with [facility] RNCM today.  09/15/21: [name] completed telephone outreach with patient for utility assistance. Patient stated that he has a cut off notice for 09/25/21 and his bill is $700.00. [name] informed patient he can go to the [facility] and can also [employer]. [name] provided patient with the telephone number for [employer]. Patient stated no other resources are needed at this time.   Patient Self Care Activities:  Ability for insight Agreement to start mental health treatment and to the self-care journey   Patient Coping Strengths:  Self Advocate Able to Communicate Effectively  Patient Self Care Deficits:  Lacks social connections  The following coping skill education was provided for stress relief and mental health management: "When your car dies or a deadline  looms, how do you respond? Long-term, low-grade or acute stress takes a serious toll on your body and mind, so don't ignore feelings of constant tension. Stress is a natural part of life. However, too much stress can harm our health, especially if it continues every day. This is chronic stress and can put you at risk for heart problems like heart disease and depression. Understand what's happening inside your body and learn simple coping skills to combat the negative impacts of everyday stressors.  Types of Stress There are two types of stress: Emotional - types of emotional stress are relationship problems, pressure at work, financial worries, experiencing discrimination or having a major life change. Physical - Examples of physical stress include being sick having pain, not sleeping well, recovery from an injury or having an alcohol and drug use disorder. Fight or Flight Sudden or ongoing stress activates your nervous system and floods your bloodstream with  adrenaline and cortisol, two hormones that raise blood pressure, increase heart rate and spike blood sugar. These changes pitch your body into a fight or flight response. That enabled our ancestors to outrun saber-toothed tigers, and it's helpful today for situations like dodging a car accident. But most modern chronic stressors, such as finances or a challenging relationship, keep your body in that heightened state, which hurts your health. Effects of Too Much Stress If constantly under stress, most of [place] will eventually start to function less well.  Multiple studies link chronic stress to a higher risk of heart disease, stroke, depression, weight gain, memory loss and even premature death, so it's important to recognize the warning signals. Talk to your doctor about ways to manage stress if you're experiencing any of these symptoms: Prolonged periods of poor sleep. Regular, severe headaches. Unexplained weight loss or gain. Feelings of isolation, withdrawal or worthlessness. Constant anger and irritability. Loss of interest in activities. Constant worrying or obsessive thinking. Excessive alcohol or drug use. Inability to concentrate.  10 Ways to Cope with Chronic Stress It's key to recognize stressful situations as they occur because it allows you to focus on managing how you react. We all need to know when to close our eyes and take a deep breath when we feel tension rising. Use these tips to prevent or reduce chronic stress. 1. Rebalance Work and Home All work and no play? If you're spending too much time at the office, intentionally put more dates in your calendar to enjoy time for fun, either alone or with others. 2. Get Regular Exercise Moving your body on a regular basis balances the nervous system and increases blood circulation, helping to flush out stress hormones. Even a daily 20-minute walk makes a difference. Any kind of exercise can lower stress and improve your mood ? just pick  activities that you enjoy and make it a regular habit. 3. Eat Well and Limit Alcohol and Stimulants Alcohol, nicotine and caffeine may temporarily relieve stress but have negative health impacts and can make stress worse in the long run. Well-nourished bodies cope better, so start with a good breakfast, add more organic fruits and vegetables for a well-balanced diet, avoid processed foods and sugar, try herbal tea and drink more water. 4. Connect with Supportive People Talking face to face with another person releases hormones that reduce stress. Lean on those good listeners in your life. 5. Carve Out Hobby Time Do you enjoy gardening, reading, listening to music or some other creative pursuit? Engage in activities that bring you pleasure and joy;  research shows that reduces stress by almost half and lowers your heart rate, too. 6. Practice Meditation, Stress Reduction or Yoga Relaxation techniques activate a state of restfulness that counterbalances your body's fight-or-flight hormones. Even if this also means a 10-minute break in a long day: listen to music, read, go for a walk in nature, do a hobby, take a bath or spend time with a friend. Also consider doing a mindfulness exercise or try a daily deep breathing or imagery practice. Deep Breathing Slow, calm and deep breathing can help you relax. Try these steps to focus on your breathing and repeat as needed. Find a comfortable position and close your eyes. Exhale and drop your shoulders. Breathe in through your nose; fill your lungs and then your belly. Think of relaxing your body, quieting your mind and becoming calm and peaceful. Breathe out slowly through your nose, relaxing your belly. Think of releasing tension, pain, worries or distress. Repeat steps three and four until you feel relaxed. Imagery This involves using your mind to excite the senses -- sound, vision, smell, taste and feeling. This may help ease your stress. Begin by getting  comfortable and then do some slow breathing. Imagine a place you love being at. It could be somewhere from your childhood, somewhere you vacationed or just a place in your imagination. Feel how it is to be in the place you're imagining. Pay attention to the sounds, air, colors, and who is there with you. This is a place where you feel cared for and loved. All is well. You are safe. Take in all the smells, sounds, tastes and feelings. As you do, feel your body being nourished and healed. Feel the calm that surrounds you. Breathe in all the good. Breathe out any discomfort or tension. 7. Sleep Enough If you get less than seven to eight hours of sleep, your body won't tolerate stress as well as it could. If stress keeps you up at night, address the cause, and add extra meditation into your day to make up for the lost z's. Try to get seven to nine hours of sleep each night. Make a regular bedtime schedule. Keep your room dark and cool. Try to avoid computers, TV, cell phones and tablets before bed. 8. Bond with Connections You Enjoy Go out for a coffee with a friend, chat with a neighbor, call a family member, visit with a clergy member, or even hang out with your pet. Clinical studies show that spending even a short time with a companion animal can cut anxiety levels almost in half. 9. Take a Vacation Getting away from it all can reset your stress tolerance by increasing your mental and emotional outlook, which makes you a happier, more productive person upon return. Leave your cellphone and laptop at home! 10. See a Counselor, Coach or Therapist If negative thoughts overwhelm your ability to make positive changes, it's time to seek professional help. Make an appointment today--your health and life are worth it."     24- Hour Availability:    [facility]  [address] [place], [place] [phone] Crisis [phone]   [facility] [employer] [phone]   [facility] [facility]  [phone]    [facility]  [phone] (after hours)   Therapeutic Alternative/Mobile Crisis   [phone]   [place] National Suicide Hotline  (575)[phone] (TALK) OR 988   Call 911 or go to emergency room   [facility]  (  [phone]);  [place] and [employer]  [phone]); Georgina Quint, [place], [place], [place], [place], [place]            07/08/2022   10:52 AM 05/11/2022    4:17 PM 12/21/2021   11:44 AM 08/20/2021    9:57 AM 05/14/2021   12:36 PM  Depression screen PHQ 2/9  Decreased Interest 2 1 2  0 0  Down, Depressed, Hopeless 2 1 1  0 1  PHQ - 2 Score 4 2 3  0 1  Altered sleeping 3 3 3  3   Tired, decreased energy 3 3 2  1   Change in appetite 2 2 0  0  Feeling bad or failure about yourself  2 2 0  0  Trouble concentrating 2 2 1   0  Moving slowly or fidgety/restless 0 0 1  0  Suicidal thoughts 0 0 0  0  PHQ-9 Score 16 14 10  5   Difficult doing work/chores Very difficult      Follow up goal      Follow up:  Patient agrees to Care Plan and Follow-up.  Plan: The Managed Medicaid care management team will reach out to the patient again over the next 30 days.  Dickie La, [name], MSW, [employer] Managed Medicaid LCSW [facility]  [facility] [email or web address] Phone: [phone]

## 2022-09-27 NOTE — Patient Outreach (Signed)
Medicaid Managed Care Social Work Note  09/27/2022 Name:  Chris [name] MRN:  [id number] DOB:  1960/08/14  Chris [name] is an 62 y.o. year old male who is a primary patient of Chris Matar, MD.  The Medicaid Managed Care Coordination team was consulted for assistance with:  Community Resources   Chris [name] was given information about Medicaid Managed Care Coordination team [name] today. Chris [name] Patient agreed to [name] and verbal consent obtained.  Engaged with patient  for by telephone forfollow up visit in response to referral for case management and/or care coordination [name].   Assessments/Interventions:  Review of past medical history, allergies, medications, health status, including review of consultants reports, laboratory and other test data, was performed as part of comprehensive evaluation and provision of chronic care management [name].  SDOH: (Social Determinant of Health) assessments and interventions performed: SDOH Interventions    Flowsheet Row Patient Outreach Telephone from 08/27/2022 in [place] POPULATION HEALTH DEPARTMENT Patient Outreach Telephone from 08/19/2022 in [place] POPULATION HEALTH DEPARTMENT Patient Outreach Telephone from 07/27/2022 in [name] POPULATION HEALTH DEPARTMENT Patient Outreach Telephone from 07/23/2022 in [place] POPULATION HEALTH DEPARTMENT Patient Outreach Telephone from 07/08/2022 in  POPULATION HEALTH DEPARTMENT ED to Hosp-Admission (Discharged) from 04/19/2022 in [facility] CARDIAC MED PCU  SDOH Interventions        Housing Interventions -- -- -- Intervention Not Indicated -- --  Transportation Interventions -- -- -- Other (Comment)  Cabin crew arranged by MM Care Guide for upcoming appointments] -- Patient Resources Dietitian)  Utilities Interventions -- -- -- Other (Comment)  [MM Social WOrk team consulted] -- --  Depression Interventions/Treatment  -- -- --  -- Referral to Psychiatry --  Financial Strain Interventions -- -- -- Other (Comment)  [MM Social Work Support] -- --  Stress Interventions Offered [employer], Provide Counseling Offered [employer], Provide Counseling Provide Counseling, Warehouse manager Wellness Resources -- [employer], Provide Counseling --  Social Connections Interventions -- -- -- Other (Comment)  [SW team referral] -- --     [name] completed a telephone outreach with patient, he stated he is still in pain and still wants pain management, [name] explained she contacted Chris [name] and they stated a referral is needed for pain management or they could provide PCP [name] and a referral is not needed. Patient stated Chris [name] is not in network with healthy [name]. Patient stated he is also in need of a sooner appointment with PCP. He does not know if [name] made the payment for his Chris [name] bill, he states he made the payment. Patient states he really enjoyed speaking with counselor.   Advanced Directives Status:  Not addressed in this encounter.  Care Plan                 No Known Allergies  Medications Reviewed Today     Reviewed by Chris Dach, RN (Registered Nurse) on 09/06/22 at 1059  Med List Status: <None>   Medication Order Taking? Sig Documenting Provider Last Dose Status Informant  Accu-Chek Softclix Lancets lancets [id number] Yes Use as instructed Chris Matar, MD Taking Active Self  acetaminophen (TYLENOL) 500 MG tablet [id number] Yes Take 500-1,000 mg by mouth every 6 (six) hours as needed (pain.). [provider] Taking Active Self  albuterol (PROVENTIL) (2.5 MG/3ML) 0.083% nebulizer solution [id number] Yes Take 3 mLs (2.5 mg total) by nebulization every 4 (four) hours as needed for wheezing  or shortness of breath. Alford Highland, MD Taking Active Self  albuterol (VENTOLIN HFA) 108 (90 Base) MCG/ACT inhaler [id number] Yes Inhale  2 puffs into the lungs every 6 (six) hours as needed for wheezing or shortness of breath. Alford Highland, MD Taking Active Self  allopurinol (ZYLOPRIM) 100 MG tablet [id number] Yes TAKE 2 TABLETS (200 MG TOTAL) BY MOUTH DAILY. Chris Matar, MD Taking Active Self  apixaban (ELIQUIS) 5 MG TABS tablet [id number] Yes Take 1 tablet (5 mg total) by mouth 2 (two) times daily. [name], MD Taking Active            Med Note Ardelia Mems, MELANIE A   Mon Sep 06, 2022 10:58 AM)    atorvastatin (LIPITOR) 40 MG tablet [id number] Yes Take 1 tablet (40 mg total) by mouth daily. Delma Freeze, FNP Taking Active Self  Blood Glucose Monitoring Suppl (ACCU-CHEK GUIDE) w/Device Andria Rhein [id number] Yes Use as directed Chris Matar, MD Taking Active Self  budesonide-formoterol [facility]) 160-4.5 MCG/ACT inhaler [id number] Yes Inhale 2 puffs into the lungs 2 (two) times daily. Alford Highland, MD Taking Active Self  carvedilol (COREG) 6.25 MG tablet [id number] Yes Take 1 tablet (6.25 mg total) by mouth 2 (two) times daily. Delma Freeze, FNP Taking Active Self  colchicine 0.6 MG tablet [id number] Yes Take 0.5 tablets (0.3 mg total) by mouth three times a week. Chris Matar, MD Taking Active   dapagliflozin propanediol (FARXIGA) 10 MG TABS tablet [id number] Yes Take 1 tablet (10 mg total) by mouth daily. Delma Freeze, FNP Taking Active Self  DULoxetine (CYMBALTA) 30 MG capsule [id number] No Take 1 capsule (30 mg total) by mouth daily.  Patient not taking: Reported on 09/06/2022   Park Pope, MD Not Taking Active   glucose blood (ACCU-CHEK GUIDE) test strip [id number] Yes Use as directed to check blood sugar 1-2 times a day Chris Matar, MD Taking Active Self  isosorbide-hydrALAZINE (BIDIL) 20-37.5 MG tablet [id number] Yes Take 1 tablet by mouth 3 (three) times daily. [name], MD Taking Active   methylPREDNISolone (MEDROL DOSEPAK) 4 MG TBPK tablet [id number] No Use as directed  Patient not  taking: Reported on 09/06/2022   Arthor Captain, PA-C Not Taking Active Self           Med Note Nunzio Cory   Fri Jul 23, 2022  2:15 PM) Patient reports he completed the dose pack as prescribed    Discontinued 07/11/20 0943 (Discontinued by provider)   montelukast (SINGULAIR) 10 MG tablet [id number] Yes TAKE 1 TABLET (10 MG TOTAL) BY MOUTH AT BEDTIME. Alford Highland, MD Taking Active Self  oxyCODONE-acetaminophen (PERCOCET) 5-325 MG tablet [id number] No Take 1 tablet by mouth every 4 (four) hours as needed for severe pain.  Patient not taking: Reported on 09/06/2022   Minna Antis, MD Not Taking Active   potassium chloride (KLOR-CON M) 10 MEQ tablet [id number] Yes Take 2 tablets (20 mEq total) by mouth daily. Laurey Morale, MD Taking Active   Tiotropium Bromide Monohydrate (SPIRIVA RESPIMAT) 2.5 MCG/ACT AERS [id number] Yes Inhale 2 puffs into the lungs daily. Alford Highland, MD Taking Active Self  torsemide (DEMADEX) 20 MG tablet [id number] Yes Take by mouth daily. Take  every other day alternating with  every other day. [provider] Taking Active   traZODone (DESYREL) 50 MG tablet [id number] No Take 1-2 tablets (50-100 mg total) by mouth at bedtime.  Patient not taking: Reported on 09/06/2022   Park Pope, MD Not Taking Active  Patient Active Problem List   Diagnosis Date Noted   Post traumatic stress disorder 09/23/2022   MDD (major depressive disorder), recurrent episode, moderate 08/26/2022   Stage 3b chronic kidney disease 08/09/2022   Hypertrophic cardiomyopathy 08/09/2022   History of renal cell cancer 08/09/2022   Chronic heart failure with preserved ejection fraction (HFpEF) 07/13/2022   Agitation 04/23/2022   RSV (respiratory syncytial virus pneumonia) 04/22/2022   Obesity (BMI 30-39.9) 04/21/2022   COPD exacerbation 04/19/2022   Dyslipidemia 04/19/2022   Gout 04/19/2022   Right kidney mass 05/14/2021   CHF exacerbation 04/14/2021    Chest pain 04/14/2021   Syncope 04/14/2021   Left-sided weakness 10/28/2020   Typical atrial flutter    CHF (congestive heart failure) 07/04/2020   Acute exacerbation of CHF (congestive heart failure) 06/16/2020   Influenza vaccine refused 05/06/2020   Acute decompensated heart failure 05/04/2020   Illiteracy 05/04/2020   Type 2 diabetes mellitus with stage 3 chronic kidney disease 12/25/2019   Elevated troponin I level 10/26/2019   History of gout 02/01/2019   Seasonal allergic rhinitis due to pollen 02/01/2019   Tobacco dependence 11/30/2018   Microscopic hematuria 11/30/2018   Depression 11/30/2018   Difficulty controlling anger 11/30/2018   COPD (chronic obstructive pulmonary disease)    CKD (chronic kidney disease) stage 3, GFR 30-59 ml/min 08/10/2018   Recurrent epistaxis 04/21/2018   Mixed hyperlipidemia 07/28/2017   Essential hypertension 07/28/2017   Chronic systolic heart failure 10/25/2014   Cocaine abuse 02/20/2013   Cannabis abuse 02/20/2013   Back pain, chronic 02/20/2013    Conditions to be addressed/monitored per PCP order:   community resources  There are no care plans that you recently modified to display for this patient.   Follow up:  Patient agrees to Care Plan and Follow-up.  Plan: The Managed Medicaid care management team will reach out to the patient again over the next 30 days.  Date/time of next scheduled Social Work care management/care coordination outreach:  10/27/22  [name], [name], [facility]  Managed [facility] Social Worker [phone]

## 2022-09-27 NOTE — Progress Notes (Signed)
I, DeAsia L Maxie,acting as a scribe for Riki Altes, MD.,have documented all relevant documentation on the behalf of Riki Altes, MD,as directed by  Riki Altes, MD while in the presence of Riki Altes, MD.   I, Maysun L Gibbs,acting as a scribe for Riki Altes, MD.,have documented all relevant documentation on the behalf of Riki Altes, MD,as directed by  Riki Altes, MD while in the presence of Riki Altes, MD.   09/27/22 3:34 PM   [name] [birth year] [id number]  Referring provider: [name], MD [address] [place],  [place] [address]  Chief Complaint  Patient presents with   Other    HPI: 62 y.o. male presents for follow-up of right renal mass.  History of a right renal mass and underwent CT guided tumor ablation by interventional radiology 06/23/22. Biopsy returned papillary renal cell carcinoma. IR follow up was recommended with a renal MRI 3 months post procedure. He has not followed up with IR. Reports pain in the left flank region radiating to the left lower quadrant. This pain has been present since the procedure, although the procedure was performed on the contralateral side   PMH: Past Medical History:  Diagnosis Date   Arrhythmia    atrial flutter   CHF (congestive heart failure) (HCC)    Chronic kidney disease    COPD (chronic obstructive pulmonary disease) (HCC)    Coronary artery disease    Depression    Diabetes mellitus without complication (HCC)    GERD (gastroesophageal reflux disease)    Gout    Hypertension    Influenza A with respiratory manifestations    Mental disorder     Surgical History: Past Surgical History:  Procedure Laterality Date   ANKLE SURGERY     CARDIAC CATHETERIZATION     CARDIOVERSION N/A 07/08/2020   Procedure: CARDIOVERSION;  Surgeon: Lamar Blinks, MD;  Location: [facility] ORS;  Service: Cardiovascular;  Laterality: N/A;   COLONOSCOPY WITH PROPOFOL N/A 11/19/2021    Procedure: COLONOSCOPY WITH PROPOFOL;  Surgeon: Jenel Lucks, MD;  Location: [facility];  Service: Gastroenterology;  Laterality: N/A;   HERNIA REPAIR     x2   IR RADIOLOGIST EVAL & MGMT  05/13/2022   POLYPECTOMY  11/19/2021   Procedure: POLYPECTOMY;  Surgeon: Jenel Lucks, MD;  Location: [facility];  Service: Gastroenterology;;   RADIOLOGY WITH ANESTHESIA N/A 06/23/2022   Procedure: Right renal tumor ablation;  Surgeon: Bennie Dallas, MD;  Location: [facility] OR;  Service: Radiology;  Laterality: N/A;   RIGHT HEART CATH N/A 07/13/2022   Procedure: RIGHT HEART CATH;  Surgeon: Laurey Morale, MD;  Location: [facility] INVASIVE CV LAB;  Service: Cardiovascular;  Laterality: N/A;   SHOULDER SURGERY     TEE WITHOUT CARDIOVERSION N/A 07/08/2020   Procedure: TRANSESOPHAGEAL ECHOCARDIOGRAM (TEE);  Surgeon: Lamar Blinks, MD;  Location: [facility] ORS;  Service: Cardiovascular;  Laterality: N/A;    Home Medications:  Allergies as of 09/27/2022   No Known Allergies      Medication List        Accurate as of September 27, 2022  3:34 PM. If you have any questions, ask your nurse or doctor.          Accu-Chek Guide test strip Generic drug: glucose blood Use as directed to check blood sugar 1-2 times a day   Accu-Chek Guide w/Device Kit Use as directed   Accu-Chek Softclix Lancets  lancets Use as instructed   acetaminophen 500 MG tablet Commonly known as: TYLENOL Take 500-1,000 mg by mouth every 6 (six) hours as needed (pain.).   albuterol 108 (90 Base) MCG/ACT inhaler Commonly known as: Ventolin HFA Inhale 2 puffs into the lungs every 6 (six) hours as needed for wheezing or shortness of breath.   albuterol (2.5 MG/3ML) 0.083% nebulizer solution Commonly known as: PROVENTIL Take 3 mLs (2.5 mg total) by nebulization every 4 (four) hours as needed for wheezing or shortness of breath.   allopurinol 100 MG tablet Commonly known as: ZYLOPRIM TAKE 2 TABLETS (200 MG TOTAL) BY MOUTH  DAILY.   atorvastatin 40 MG tablet Commonly known as: LIPITOR Take 1 tablet (40 mg total) by mouth daily.   carvedilol 6.25 MG tablet Commonly known as: COREG Take 1 tablet (6.25 mg total) by mouth 2 (two) times daily.   colchicine 0.6 MG tablet Take 0.5 tablets (0.3 mg total) by mouth three times a week.   DULoxetine 30 MG capsule Commonly known as: CYMBALTA Take 1 capsule (30 mg total) by mouth daily.   Eliquis 5 MG Tabs tablet Generic drug: apixaban Take 1 tablet (5 mg total) by mouth 2 (two) times daily.   Farxiga 10 MG Tabs tablet Generic drug: dapagliflozin propanediol Take 1 tablet (10 mg total) by mouth daily.   isosorbide-hydrALAZINE 20-37.5 MG tablet Commonly known as: BiDil Take 1 tablet by mouth 3 (three) times daily.   methylPREDNISolone 4 MG Tbpk tablet Commonly known as: MEDROL DOSEPAK Use as directed   montelukast 10 MG tablet Commonly known as: SINGULAIR TAKE 1 TABLET (10 MG TOTAL) BY MOUTH AT BEDTIME.   oxyCODONE-acetaminophen 5-325 MG tablet Commonly known as: Percocet Take 1 tablet by mouth every 4 (four) hours as needed for severe pain.   potassium chloride 10 MEQ tablet Commonly known as: KLOR-CON M Take 2 tablets (20 mEq total) by mouth daily.   Spiriva Respimat 2.5 MCG/ACT Aers Generic drug: Tiotropium Bromide Monohydrate Inhale 2 puffs into the lungs daily.   Symbicort 160-4.5 MCG/ACT inhaler Generic drug: budesonide-formoterol Inhale 2 puffs into the lungs 2 (two) times daily.   torsemide 20 MG tablet Commonly known as: DEMADEX Take by mouth daily. Take 40mg  every other day alternating with 20mg  every other day.   traZODone 50 MG tablet Commonly known as: DESYREL Take 1-2 tablets (50-100 mg total) by mouth at bedtime.        Allergies: No Known Allergies  Family History: Family History  Problem Relation Age of Onset   Heart disease Father    Diabetes Mother    HIV Brother    Healthy Son    Healthy Daughter      Social History:  reports that he has been smoking cigarettes. He has a 43.00 pack-year smoking history. He has never used smokeless tobacco. He reports current drug use. Frequency: 21.00 times per week. Drugs: Marijuana and Cocaine. He reports that he does not drink alcohol.   Physical Exam: BP (!) 172/88   Pulse 88   Ht 6' (1.829 m)   Wt 270 lb (122.5 kg)   BMI 36.62 kg/m   Constitutional:  Alert and oriented, No acute distress. HEENT: [place] AT Respiratory: Normal respiratory effort, no increased work of breathing. Psychiatric: Normal mood and affect.  Assessment & Plan:    1.  Status post percutaneous ablation of right renal mass He is due for renal MRI and IR follow up and will schedule.  2.  Left flank pain  Evaluate further with MRI  I have reviewed the above documentation for accuracy and completeness, and I agree with the above.   Riki Altes, MD  [facility] [address] [place], [place] [address] [phone]

## 2022-09-27 NOTE — Patient Instructions (Signed)
Visit Information  Mr. Ridenhour was given information about Medicaid Managed Care team care coordination services as a part of their Healthy [facility] Medicaid benefit. GARELD BLAES verbally consented to engagement with the [facility] Managed Care team.   If you are experiencing a medical emergency, please call 911 or report to your local emergency department or urgent care.   If you have a non-emergency medical problem during routine business hours, please contact your provider's office and ask to speak with a nurse.   For questions related to your Healthy [facility] health plan, please call: [phone] or visit the homepage here: [email or web address]  If you would like to schedule transportation through your Healthy [facility] plan, please call the following number at least 2 days in advance of your appointment: [phone]  For information about your ride after you set it up, call Ride Assist at [phone]. Use this number to activate a Will Call pickup, or if your transportation is late for a scheduled pickup. Use this number, too, if you need to make a change or cancel a previously scheduled reservation.  If you need transportation services right away, call [phone]. The after-hours call center is staffed 24 hours to handle ride assistance and urgent reservation requests (including discharges) 365 days a year. Urgent trips include sick visits, hospital discharge requests and life-sustaining treatment.  Call the [facility] Line at [phone], at any time, 24 hours a day, 7 days a week. If you are in danger or need immediate medical attention call 911.  If you would like help to quit smoking, call 1-800-QUIT-NOW ((305)566-4485) OR Espaol: 1-855-Djelo-Ya (2-707-867-5449) o para ms informacin haga clic aqu or Text READY to 201-007 to register via text  Mr. Blondin - following are the goals we discussed in your visit today:    Goals Addressed   None     Social Worker will follow up in 30 days.   [name], [name], MHA [facility]  Managed Medicaid Social Worker [phone]   Following is a copy of your plan of care:  There are no care plans that you recently modified to display for this patient.

## 2022-09-28 ENCOUNTER — Other Ambulatory Visit
Admission: RE | Admit: 2022-09-28 | Discharge: 2022-09-28 | Disposition: A | Discharge status: Home / Self Care | Payer: Medicaid Other | Source: Ambulatory Visit | Attending: Cardiology | Admitting: Cardiology

## 2022-09-28 ENCOUNTER — Ambulatory Visit (HOSPITAL_BASED_OUTPATIENT_CLINIC_OR_DEPARTMENT_OTHER): Payer: Medicaid Other | Admitting: Cardiology

## 2022-09-28 ENCOUNTER — Encounter: Payer: Self-pay | Admitting: Cardiology

## 2022-09-28 VITALS — BP 144/84 | HR 79 | Resp 16 | Wt 261.2 lb

## 2022-09-28 DIAGNOSIS — I5032 Chronic diastolic (congestive) heart failure: Secondary | ICD-10-CM | POA: Diagnosis not present

## 2022-09-28 LAB — BRAIN NATRIURETIC PEPTIDE: B Natriuretic Peptide: 279.8 pg/mL — ABNORMAL HIGH (ref 0.0–100.0)

## 2022-09-28 LAB — CBC
HCT: 45.2 % (ref 39.0–52.0)
Hemoglobin: 14.4 g/dL (ref 13.0–17.0)
MCH: 29 pg (ref 26.0–34.0)
MCHC: 31.9 g/dL (ref 30.0–36.0)
MCV: 91.1 fL (ref 80.0–100.0)
Platelets: 187 10*3/uL (ref 150–400)
RBC: 4.96 MIL/uL (ref 4.22–5.81)
RDW: 15.4 % (ref 11.5–15.5)
WBC: 8.7 10*3/uL (ref 4.0–10.5)
nRBC: 0 % (ref 0.0–0.2)

## 2022-09-28 LAB — BASIC METABOLIC PANEL
Anion gap: 6 (ref 5–15)
BUN: 24 mg/dL — ABNORMAL HIGH (ref 6–20)
CO2: 28 mmol/L (ref 22–32)
Calcium: 9 mg/dL (ref 8.9–10.3)
Chloride: 106 mmol/L (ref 98–111)
Creatinine, Ser: 1.75 mg/dL — ABNORMAL HIGH (ref 0.61–1.24)
GFR, Estimated: 44 mL/min — ABNORMAL LOW (ref >60)
Glucose, Bld: 99 mg/dL (ref 70–99)
Potassium: 4.2 mmol/L (ref 3.5–5.1)
Sodium: 140 mmol/L (ref 135–145)

## 2022-09-28 MED ORDER — CARVEDILOL 6.25 MG PO TABS: 9.3750 mg | ORAL_TABLET | Freq: Two times a day (BID) | ORAL | 3 refills | Status: DC

## 2022-09-28 NOTE — Progress Notes (Signed)
   09/28/22 1151  ReDS Vest / Clip  Station Marker D  Ruler Value 41  ReDS Value Range < 36  ReDS Actual Value 33

## 2022-09-28 NOTE — Progress Notes (Signed)
PCP: [name], MD HF Cardiology: Dr. Shirlee Latch  62 y.o. with history of HFpEF, CKD stage 3, possible hypertrophic cardiomyopathy, renal cell CA s/p ablation, and atrial flutter s/p 1/22 DCCV who was referred to CHF MD clinic by [facility].  Patient was admitted in 1/22 with CHF and found to be in atrial flutter.  Echo showed EF 25% and he was cardioverted to NSR.  He is no longer anticoagulated. Cardiolite in 2/23 showed no ischemia, fixed inferior defect.  Echo in 8/23 showed EF 50% with severe asymmetric septal hypertrophy but no SAM or LVOT gradient.  Cardiac MRI in 12/23 was similar with LV EF 50%, severe asymmetric septal hypertrophy, normal RV, LGE in the basal septum. These studies were concerning for hypertrophic cardiomyopathy.  Patient is an active smoker and used to use cocaine.  He carries history of COPD.  Grandfather had sudden cardiac death at work ("fell out dead").   RHC in 2022/07/17 showed elevated filling pressures, CI 2.25, PVR 3.9 WU.  Torsemide was increased.    Patient was seen in the ER in 2/6 with abdominal pain, nausea/vomiting.  At that time, creatinine was up to 2.54.   Zio monitor done 2/24 for episodes of ?syncope showed no AF/AFL, 1.5 % PVCs, 6 short NSVT runs (longest was 7 beats).   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  PKP2 gene is associated with ARVC and Brugada, but the clinical significance of the mutation found in this patient is uncertain (not reported in literature).  He returns today for followup of CHF, ?HCM.  He had his birthday party yesterday and drank a lot, does not feel good today.  Has not taken any of his meds yet and BP is high.  Main complaint continues to be left hip and knee pain.  He says he is not longer using any cocaine.  He usually does not  drink.  He rarely smokes.  He is short of breath walking "long distances" on flat ground.  Limited more by joints.  No orthopnea/PND.  No chest pain.  No further syncope or presyncope and rarely gets lightheaded.   Labs 07/17/2022): BNP 873, K 3.6, creatinine 1.7 Labs (2/24): K 4, creatinine 2.54  PMH: 1. Renal cell carcinoma on right: s/p ablation 17-Jul-2022.  2. 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  EF low in the past, echo in 1/22 with EF 22%.   - Cardiolite (2/23): Fixed inferior defect, ?artifact.  - Echo (8/23): EF 50%, severe asymmetric septal hypertrophy, no SAM/MR, no LVOT gradient, RV normal.  - Cardiac MRI (12/23): LV EF 50%, severe asymmetric septal hypertrophy, normal RV, LGE in the basal septum.  Possible hypertrophic cardiomyopathy.  - RHC 07-17-22): mean RA 15, PA 50/31 mean 42, mean PCWP 21, CI 2.25, PVR 3.9 WU, PAPi 1.3 - Zio monitor done 2/24 showed no AF/AFL, 1.5 % PVCs, 6 short NSVT runs (longest was 7 beats).  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  PKP2 gene is associated with ARVC and Brugada, but the clinical significance of the mutation found in this  patient is uncertain (not reported in literature). 3. Atrial flutter: DCCV 1/22.   4. Type 2 diabetes.  5. HTN 6. CKD stage 3 7. Gout 8. Depression 9. COPD: Active smoker.  10. Prior cocaine abuse.   Social History   Socioeconomic History   Marital status: Divorced    Spouse name: Not on file   Number of children: 3   Years of education: Not on file   Highest education level: High school graduate  Occupational History   Occupation: disability  Tobacco Use   Smoking status: Every Day    Packs/day: 1.00    Years: 43.00    Additional pack years: 0.00    Total pack years: 43.00     Types: Cigarettes   Smokeless tobacco: Never   Tobacco comments:        4 cigs daily--05/12/2022  Vaping Use   Vaping Use: Never used  Substance and Sexual Activity   Alcohol use: Yes    Alcohol/week: 4.0 standard drinks of alcohol    Types: 4 Shots of liquor per week   Drug use: Yes    Frequency: 21.0 times per week    Types: Marijuana    Comment: last use Cocaine- 03/28/2021. Still using marijuana, last use 06/22/21   Sexual activity: Not on file  Other Topics Concern   Not on file  Social History Narrative   ** Merged History Encounter **       Social Determinants of Health   Financial Resource Strain: Medium Risk (07/23/2022)   Overall Financial Resource Strain (CARDIA)    Difficulty of Paying Living Expenses: Somewhat hard  Food Insecurity: No Food Insecurity (04/20/2022)   Hunger Vital Sign    Worried About Running Out of Food in the Last Year: Never true    Ran Out of Food in the Last Year: Never true  Transportation Needs: Unmet Transportation Needs (07/23/2022)   PRAPARE - Administrator, Civil Service (Medical): Yes    Lack of Transportation (Non-Medical): Yes  Physical Activity: Insufficiently Active (11/16/2021)   Exercise Vital Sign    Days of Exercise per Week: 5 days    Minutes of Exercise per Session: 20 min  Stress: Stress Concern Present (08/27/2022)   [employer] of Occupational Health - Occupational Stress Questionnaire    Feeling of Stress : Very much  Social Connections: Moderately Isolated (07/23/2022)   Social Connection and Isolation Panel [NHANES]    Frequency of Communication with Friends and Family: Twice a week    Frequency of Social Gatherings with Friends and Family: Once a week    Attends Religious Services: More than 4 times per year    Active Member of [employer] or Organizations: No    Attends Banker Meetings: Never    Marital Status: Divorced  Catering manager Violence: Not At Risk (04/20/2022)   Humiliation, Afraid,  Rape, and Kick questionnaire    Fear of Current or Ex-Partner: No    Emotionally Abused: No    Physically Abused: No    Sexually Abused: No   Family History  Problem Relation Age of Onset   Heart disease Father    Diabetes Mother    HIV Brother    Healthy Son    Healthy Daughter    ROS: All systems reviewed and negative except as per HPI.   Current Outpatient Medications  Medication Sig Dispense Refill   Accu-Chek Softclix Lancets lancets Use as instructed 100 each 12   acetaminophen (TYLENOL) 500  MG tablet Take 500-1,000 mg by mouth every 6 (six) hours as needed (pain.).     albuterol (PROVENTIL) (2.5 MG/3ML) 0.083% nebulizer solution Take 3 mLs (2.5 mg total) by nebulization every 4 (four) hours as needed for wheezing or shortness of breath. 300 mL 0   albuterol (VENTOLIN HFA) 108 (90 Base) MCG/ACT inhaler Inhale 2 puffs into the lungs every 6 (six) hours as needed for wheezing or shortness of breath. 6.7 g 0   allopurinol (ZYLOPRIM) 100 MG tablet TAKE 2 TABLETS (200 MG TOTAL) BY MOUTH DAILY. 180 tablet 0   apixaban (ELIQUIS) 5 MG TABS tablet Take 1 tablet (5 mg total) by mouth 2 (two) times daily. 60 tablet 2   atorvastatin (LIPITOR) 40 MG tablet Take 1 tablet (40 mg total) by mouth daily. 90 tablet 3   Blood Glucose Monitoring Suppl (ACCU-CHEK GUIDE) w/Device KIT Use as directed 1 kit 0   budesonide-formoterol (SYMBICORT) 160-4.5 MCG/ACT inhaler Inhale 2 puffs into the lungs 2 (two) times daily. 10.2 g 0   colchicine 0.6 MG tablet Take 0.5 tablets (0.3 mg total) by mouth three times a week. 15 tablet 1   dapagliflozin propanediol (FARXIGA) 10 MG TABS tablet Take 1 tablet (10 mg total) by mouth daily. 90 tablet 3   DULoxetine (CYMBALTA) 30 MG capsule Take 1 capsule (30 mg total) by mouth daily. 30 capsule 1   glucose blood (ACCU-CHEK GUIDE) test strip Use as directed to check blood sugar 1-2 times a day 100 each 12   isosorbide-hydrALAZINE (BIDIL) 20-37.5 MG tablet Take 1 tablet by  mouth 3 (three) times daily. 270 tablet 3   montelukast (SINGULAIR) 10 MG tablet TAKE 1 TABLET (10 MG TOTAL) BY MOUTH AT BEDTIME. 30 tablet 0   potassium chloride (KLOR-CON M) 10 MEQ tablet Take 2 tablets (20 mEq total) by mouth daily. 180 tablet 3   Tiotropium Bromide Monohydrate (SPIRIVA RESPIMAT) 2.5 MCG/ACT AERS Inhale 2 puffs into the lungs daily. 4 g 0   torsemide (DEMADEX) 20 MG tablet Take 40 mg by mouth daily.     traZODone (DESYREL) 50 MG tablet Take 1-2 tablets (50-100 mg total) by mouth at bedtime. 60 tablet 1   carvedilol (COREG) 6.25 MG tablet Take 1.5 tablets (9.375 mg total) by mouth 2 (two) times daily. 180 tablet 3   oxyCODONE-acetaminophen (PERCOCET) 5-325 MG tablet Take 1 tablet by mouth every 4 (four) hours as needed for severe pain. (Patient not taking: Reported on 09/28/2022) 12 tablet 0   No current facility-administered medications for this visit.   BP (!) 144/84 (BP Location: Right Arm, Patient Position: Sitting, Cuff Size: Large)   Pulse 79   Resp 16   Wt 261 lb 4 oz (118.5 kg)   SpO2 97%   BMI 35.43 kg/m  General: NAD Neck: Thick. JVP 8-9 cm, no thyromegaly or thyroid nodule.  Lungs: Clear to auscultation bilaterally with normal respiratory effort. CV: Nondisplaced PMI.  Heart regular S1/S2, no S3/S4, no murmur.  No peripheral edema.  No carotid bruit.  Normal pedal pulses.  Abdomen: Soft, nontender, no hepatosplenomegaly, no distention.  Skin: Intact without lesions or rashes.  Neurologic: Alert and oriented x 3.  Psych: Normal affect. Extremities: No clubbing or cyanosis.  HEENT: Normal.   1. Chronic diastolic CHF/?hypertrophic cardiomyopathy: Prior history of HFrEF that may have been tachycardia-mediated in setting of atrial flutter in 1/22 vs cocaine-related, now EF has recovered to 50% on last study. Cardiolite in 2/23 showed no ischemia, fixed inferior defect.  Echo in 8/23 showed EF 50% with severe asymmetric septal hypertrophy but no SAM or LVOT  gradient.  Cardiac MRI in 12/23 was similar with LV EF 50%, severe asymmetric septal hypertrophy, normal RV, LGE in the basal septum. These studies were concerning for hypertrophic cardiomyopathy.  RHC in 1/24 showed elevated filling pressures with CI 2.25.  His grandfather may have had sudden cardiac death at work.  He has had syncopal episodes that may have been orthostatic, but I worried about ventricular arrhythmias given possible HCM and LGE noted on cardiac MRI. Zio monitor in 2/24 showed 1.5% PVCs and 6 short NSVT runs (longest 7 beats).  Nothing to explain syncope and has had no further episodes. 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  PKP2 gene is associated with ARVC and Brugada, but the clinical significance of the mutation found in this patient is uncertain (not reported in literature).  NYHA class II-III, looks mildly volume overloaded on exam. 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  - I will refer him to [facility] for genetic counseling regarding the mutations of uncertain significance.  - He will need repeat echo arranged at next appointment. - With no LVOT obstruction or significant murmur, ok to continue Bidil.  - Increase Coreg to 9.375 mg bid.  - I will increase torsemide to 40 mg daily, BMET/BNP today and BMET again in 10 days.  - Continue dapagliflozin 10 mg daily.  - Will need to consider ICD, somewhat borderline. I would like him to see [facility] first.  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  He has had some short NSVT runs on 1 week monitor (longest 7 beats).  No LVOT obstruction.  EF is borderline at 50%.  He has had syncope but suspect not arrhythmic (had 1 syncopal episode while wearing monitor and no events).  Will need  referral to EP to help with decision-making.  2. CKD stage 3: BMET today.   3. COPD: Patient is active smoker and carries this history.  I strongly encouraged him to quit smoking today.  He is smoking much less than before.   4. Atrial flutter: S/p DCCV in 1/22.   - Continue apixaban. CBC today.   Followup in 1 month, will need to schedule repeat echo and EP.   Marca Ancona 09/28/2022

## 2022-09-28 NOTE — Patient Instructions (Signed)
Take Torsemide 40mg  daily  INCREASE Carvedilol to 9.375mg  ( 1.5 tablets) twice daily   Routine lab work today. Will notify you of abnormal results  repeat labs in 10 days  You have been referred to Genetics Counseling  (They will contact you for an appointment)  Follow up in 1 month  Do the following things EVERYDAY: Weigh yourself in the morning before breakfast. Write it down and keep it in a log. Take your medicines as prescribed Eat low salt foods--Limit salt (sodium) to 2000 mg per day.  Stay as active as you can everyday Limit all fluids for the day to less than 2 liters

## 2022-09-29 NOTE — Progress Notes (Addendum)
BH MD Outpatient Progress Note  10/04/2022 4:21 PM Chris [name]  MRN:  [id number]  Assessment:  Patient did not fill the duloxetine or trazodone due to financial stressors but states that he will fill and start taking.  Sleep continues to be a problem and generalized anxiety.  He reports that he has also started psychotherapy which he has found to be somewhat beneficial at this time.   Identifying Information: Chris [name] is a 62 y.o. y.o. male with a history of depression, hypertension, obesity, HFrEF (initial EF 25%, normalized to 50%) atrial flutter s/p DCCV 06/2020, COPD, current smoker x40+ years, former cocaine use x25+ years, HCM, CKD who is an established patient with [facility] for medication management.   Plan: # Major Depressive Disorder-recurrent episode, moderate Past medication trials:  Status of problem: improving Interventions: -- Continue duloxetine 30 mg daily  -GFR improved to 44, will continue to monitor --Continue trazodone 50-100 mg qhs prn for insomnia   Patient was given contact information for behavioral health clinic and was instructed to call 911 for emergencies.   Subjective:  Chief Complaint:  Chief Complaint  Patient presents with   Depression   Anxiety    Interval History:  Patient reports to being more "laid back" since last visit.  He has somewhat been more self isolating but feels that this has improved his mood as he does not want to be with other people that cause "drama".  He reports that he is eating well.  He does report that he is sleeping poorly but this was chronic and in the setting of not taking the recommended trazodone.  He reports that he had some financial stresses that led to him being unable to fill his increased dose of duloxetine as well as trazodone.  He states that he will be feeling this today or tomorrow so that he can restart treatment.  He denies present SI/HI/AVH.  Encouraged him to continue taking  the psychiatric medications and discuss with me any side effects that come up.  He verbalized agreement and understanding.  He denies any acute stressors today.  Visit Diagnosis:    ICD-10-CM   1. Mild major depression  F32.0 DULoxetine (CYMBALTA) 30 MG capsule    2. Chronic right shoulder pain  M25.511 DULoxetine (CYMBALTA) 30 MG capsule   G89.29     3. MDD (major depressive disorder), recurrent episode, moderate  F33.1 traZODone (DESYREL) 50 MG tablet      Past Psychiatric History:  Diagnoses: none Medication trials: duloxetine Previous psychiatrist/therapist: none Hospitalizations: none Suicide attempts: denies SIB: denies Hx of violence towards others: denies Current access to guns: denies  Past Medical History:  Past Medical History:  Diagnosis Date   Arrhythmia    atrial flutter   CHF (congestive heart failure)    Chronic kidney disease    COPD (chronic obstructive pulmonary disease)    Coronary artery disease    Depression    Diabetes mellitus without complication    GERD (gastroesophageal reflux disease)    Gout    Hypertension    Influenza A with respiratory manifestations    Mental disorder     Past Surgical History:  Procedure Laterality Date   ANKLE SURGERY     CARDIAC CATHETERIZATION     CARDIOVERSION N/A 07/08/2020   Procedure: CARDIOVERSION;  Surgeon: Lamar Blinks, MD;  Location: [facility] ORS;  Service: Cardiovascular;  Laterality: N/A;   COLONOSCOPY WITH PROPOFOL N/A 11/19/2021   Procedure: COLONOSCOPY WITH  PROPOFOL;  Surgeon: Jenel Lucks, MD;  Location: [facility];  Service: Gastroenterology;  Laterality: N/A;   HERNIA REPAIR     x2   IR RADIOLOGIST EVAL & MGMT  05/13/2022   POLYPECTOMY  11/19/2021   Procedure: POLYPECTOMY;  Surgeon: Jenel Lucks, MD;  Location: [facility];  Service: Gastroenterology;;   RADIOLOGY WITH ANESTHESIA N/A 06/23/2022   Procedure: Right renal tumor ablation;  Surgeon: Bennie Dallas, MD;  Location: [facility] OR;   Service: Radiology;  Laterality: N/A;   RIGHT HEART CATH N/A 07/13/2022   Procedure: RIGHT HEART CATH;  Surgeon: Laurey Morale, MD;  Location: [facility] INVASIVE CV LAB;  Service: Cardiovascular;  Laterality: N/A;   SHOULDER SURGERY     TEE WITHOUT CARDIOVERSION N/A 07/08/2020   Procedure: TRANSESOPHAGEAL ECHOCARDIOGRAM (TEE);  Surgeon: Lamar Blinks, MD;  Location: [facility] ORS;  Service: Cardiovascular;  Laterality: N/A;    Family History:  Family History  Problem Relation Age of Onset   Heart disease Father    Diabetes Mother    HIV Brother    Healthy Son    Healthy Daughter     Social History:  Social History   Socioeconomic History   Marital status: Divorced    Spouse name: Not on file   Number of children: 3   Years of education: Not on file   Highest education level: High school graduate  Occupational History   Occupation: disability  Tobacco Use   Smoking status: Every Day    Packs/day: 1.00    Years: 43.00    Additional pack years: 0.00    Total pack years: 43.00    Types: Cigarettes   Smokeless tobacco: Never   Tobacco comments:        4 cigs daily--05/12/2022  Vaping Use   Vaping Use: Never used  Substance and Sexual Activity   Alcohol use: Yes    Alcohol/week: 4.0 standard drinks of alcohol    Types: 4 Shots of liquor per week   Drug use: Yes    Frequency: 21.0 times per week    Types: Marijuana    Comment: last use Cocaine- 03/28/2021. Still using marijuana, last use 06/22/21   Sexual activity: Not on file  Other Topics Concern   Not on file  Social History Narrative   ** Merged History Encounter **       Social Determinants of Health   Financial Resource Strain: Medium Risk (07/23/2022)   Overall Financial Resource Strain (CARDIA)    Difficulty of Paying Living Expenses: Somewhat hard  Food Insecurity: No Food Insecurity (04/20/2022)   Hunger Vital Sign    Worried About Running Out of Food in the Last Year: Never true    Ran Out of Food in the Last  Year: Never true  Transportation Needs: Unmet Transportation Needs (07/23/2022)   PRAPARE - Administrator, Civil Service (Medical): Yes    Lack of Transportation (Non-Medical): Yes  Physical Activity: Insufficiently Active (11/16/2021)   Exercise Vital Sign    Days of Exercise per Week: 5 days    Minutes of Exercise per Session: 20 min  Stress: Stress Concern Present (08/27/2022)   [employer] of Occupational Health - Occupational Stress Questionnaire    Feeling of Stress : Very much  Social Connections: Moderately Isolated (07/23/2022)   Social Connection and Isolation Panel [NHANES]    Frequency of Communication with Friends and Family: Twice a week    Frequency of Social Gatherings with Friends and  Family: Once a week    Attends Religious Services: More than 4 times per year    Active Member of Clubs or Organizations: No    Attends Banker Meetings: Never    Marital Status: Divorced    Allergies: No Known Allergies  Current Medications: Current Outpatient Medications  Medication Sig Dispense Refill   Accu-Chek Softclix Lancets lancets Use as instructed 100 each 12   acetaminophen (TYLENOL) 500 MG tablet Take 500-1,000 mg by mouth every 6 (six) hours as needed (pain.).     albuterol (PROVENTIL) (2.5 MG/3ML) 0.083% nebulizer solution Take 3 mLs (2.5 mg total) by nebulization every 4 (four) hours as needed for wheezing or shortness of breath. 300 mL 0   albuterol (VENTOLIN HFA) 108 (90 Base) MCG/ACT inhaler Inhale 2 puffs into the lungs every 6 (six) hours as needed for wheezing or shortness of breath. 6.7 g 0   allopurinol (ZYLOPRIM) 100 MG tablet TAKE 2 TABLETS (200 MG TOTAL) BY MOUTH DAILY. 180 tablet 0   apixaban (ELIQUIS) 5 MG TABS tablet Take 1 tablet (5 mg total) by mouth 2 (two) times daily. 60 tablet 2   atorvastatin (LIPITOR) 40 MG tablet Take 1 tablet (40 mg total) by mouth daily. 90 tablet 3   Blood Glucose Monitoring Suppl (ACCU-CHEK GUIDE)  w/Device KIT Use as directed 1 kit 0   budesonide-formoterol (SYMBICORT) 160-4.5 MCG/ACT inhaler Inhale 2 puffs into the lungs 2 (two) times daily. 10.2 g 0   carvedilol (COREG) 6.25 MG tablet Take 1.5 tablets (9.375 mg total) by mouth 2 (two) times daily. 180 tablet 3   colchicine 0.6 MG tablet Take 0.5 tablets (0.3 mg total) by mouth three times a week. 15 tablet 1   dapagliflozin propanediol (FARXIGA) 10 MG TABS tablet Take 1 tablet (10 mg total) by mouth daily. 90 tablet 3   DULoxetine (CYMBALTA) 30 MG capsule Take 1 capsule (30 mg total) by mouth daily. 30 capsule 1   glucose blood (ACCU-CHEK GUIDE) test strip Use as directed to check blood sugar 1-2 times a day 100 each 12   isosorbide-hydrALAZINE (BIDIL) 20-37.5 MG tablet Take 1 tablet by mouth 3 (three) times daily. 270 tablet 3   montelukast (SINGULAIR) 10 MG tablet TAKE 1 TABLET (10 MG TOTAL) BY MOUTH AT BEDTIME. 30 tablet 0   oxyCODONE-acetaminophen (PERCOCET) 5-325 MG tablet Take 1 tablet by mouth every 4 (four) hours as needed for severe pain. (Patient not taking: Reported on 09/28/2022) 12 tablet 0   potassium chloride (KLOR-CON M) 10 MEQ tablet Take 2 tablets (20 mEq total) by mouth daily. 180 tablet 3   Tiotropium Bromide Monohydrate (SPIRIVA RESPIMAT) 2.5 MCG/ACT AERS Inhale 2 puffs into the lungs daily. 4 g 0   torsemide (DEMADEX) 20 MG tablet Take 40 mg by mouth daily.     traZODone (DESYREL) 50 MG tablet Take 1-2 tablets (50-100 mg total) by mouth at bedtime. 60 tablet 1   No current facility-administered medications for this visit.    ROS: Review of Systems  Constitutional:  Negative for activity change and appetite change.    Objective:  Psychiatric Specialty Exam: Blood pressure (!) 150/97, pulse 86, weight 260 lb 12.8 oz (118.3 kg), SpO2 94 %.Body mass index is 35.37 kg/m.  General Appearance: Casual  Eye Contact:  Good  Speech:  Clear and Coherent  Volume:  Normal  Mood:  Euthymic  Affect:  Appropriate and  Congruent  Thought Process:  Coherent, Goal Directed, and Linear  Orientation:  Full (Time, Place, and Person)  Thought Content: Logical   Suicidal Thoughts:  No  Homicidal Thoughts:  No  Memory:  Negative  Judgment:  Intact  Insight:  Fair  Psychomotor Activity:  Normal  Concentration:  Concentration: Fair              Assets:  Communication Skills Desire for Improvement Financial Resources/Insurance Housing  ADL's:  Intact  Cognition: WNL  Sleep:  Poor   PE: General: well-appearing; no acute distress  Pulm: no increased work of breathing on room air  Strength & Muscle Tone: within normal limits Neuro: no focal neurological deficits observed  Gait & Station: normal  Metabolic Disorder Labs: Lab Results  Component Value Date   HGBA1C 6.2 08/09/2022   MPG 131.24 04/20/2022   MPG 151.33 02/09/2021   No results found for: "PROLACTIN" Lab Results  Component Value Date   CHOL 176 12/25/2019   TRIG 91 12/25/2019   HDL 37 (L) 12/25/2019   CHOLHDL 4.8 12/25/2019   VLDL 18 12/25/2019   LDLCALC 121 (H) 12/25/2019   LDLCALC 92 11/07/2019   Lab Results  Component Value Date   TSH 1.592 10/26/2019   TSH 0.842 11/30/2018    Therapeutic Level Labs: No results found for: "LITHIUM" No results found for: "VALPROATE" No results found for: "CBMZ"  Screenings: AUDIT    Flowsheet Row [facility]  Alcohol Use Disorder Identification Test Final Score (AUDIT) 1      GAD-7    Flowsheet Row Counselor from 09/23/2022 in [facility] Office Visit from 08/09/2022 in [facility] Office Visit from 05/11/2022 in [facility] Office Visit from 12/21/2021 in [facility] Office Visit from 05/14/2021 in [facility]  Total GAD-7 Score PHQ2-9    Flowsheet Row Counselor from 09/23/2022 in [facility] Office Visit from 08/26/2022 in [facility] Office Visit from 08/09/2022 in [facility] Patient Outreach Telephone from 07/08/2022 in [place] [facility] Office Visit from 05/11/2022 in [facility]  PHQ-2 Total Score PHQ-9 Total Score Flowsheet Row ED from 07/20/2022 in [facility] Emergency Department at [facility] Admission (Discharged) from 07/13/2022 in [facility] CARD[facility]AC [facility]ATH [facility]  C-SSRS RISK CATEGORY No Risk No Risk No Risk       Collaboration of Care: Collaboration of Care:   Patient/Guardian was advised Release of Information must be obtained prior to any record release in order to collaborate their care with an outside provider. Patient/Guardian was advised if they have not already done so to contact the registration department to sign all necessary forms in order for [place] to release information regarding their care.   Consent: Patient/Guardian gives verbal consent for treatment and assignment of benefits for services provided during this visit. Patient/Guardian expressed understanding and agreed to proceed.   A total of 30 minutes was spent involved in face to face clinical care, chart review, and documentation.   Park Pope, MD 10/04/2022, 4:21 PM

## 2022-09-30 ENCOUNTER — Ambulatory Visit (INDEPENDENT_AMBULATORY_CARE_PROVIDER_SITE_OTHER): Payer: Medicaid Other | Admitting: Student

## 2022-09-30 DIAGNOSIS — M25511 Pain in right shoulder: Secondary | ICD-10-CM | POA: Diagnosis not present

## 2022-09-30 DIAGNOSIS — F320 Major depressive disorder, single episode, mild: Secondary | ICD-10-CM

## 2022-09-30 DIAGNOSIS — G8929 Other chronic pain: Secondary | ICD-10-CM | POA: Diagnosis not present

## 2022-09-30 DIAGNOSIS — F331 Major depressive disorder, recurrent, moderate: Secondary | ICD-10-CM

## 2022-09-30 MED ORDER — DULOXETINE HCL 30 MG PO CPEP
30.0000 mg | ORAL_CAPSULE | Freq: Every day | ORAL | 1 refills | Status: DC
  Filled 2022-09-30: qty 30, 30d supply, fill #0

## 2022-09-30 MED ORDER — TRAZODONE HCL 50 MG PO TABS
50.0000 mg | ORAL_TABLET | Freq: Every day | ORAL | 1 refills | Status: DC
  Filled 2022-09-30 – 2022-10-20 (×3): qty 60, 30d supply, fill #0

## 2022-10-01 ENCOUNTER — Other Ambulatory Visit: Payer: Self-pay

## 2022-10-07 ENCOUNTER — Other Ambulatory Visit: Payer: Medicaid Other | Admitting: *Deleted

## 2022-10-07 DIAGNOSIS — N2889 Other specified disorders of kidney and ureter: Secondary | ICD-10-CM | POA: Diagnosis not present

## 2022-10-07 DIAGNOSIS — I10 Essential (primary) hypertension: Secondary | ICD-10-CM | POA: Diagnosis not present

## 2022-10-07 DIAGNOSIS — N1832 Chronic kidney disease, stage 3b: Secondary | ICD-10-CM | POA: Diagnosis not present

## 2022-10-07 DIAGNOSIS — G4733 Obstructive sleep apnea (adult) (pediatric): Secondary | ICD-10-CM | POA: Diagnosis not present

## 2022-10-07 DIAGNOSIS — E1122 Type 2 diabetes mellitus with diabetic chronic kidney disease: Secondary | ICD-10-CM | POA: Diagnosis not present

## 2022-10-07 NOTE — Patient Instructions (Signed)
Visit Information  [name]  - as a part of your Medicaid benefit, you are eligible for care management and care coordination services at no cost or copay. I was unable to reach you by phone today but would be happy to help you with your health related needs. Please feel free to call me @ [phone].   A member of the Managed Medicaid care management team will reach out to you again over the next 7 days.   Estanislado Emms RN, BSN [place]  Managed [facility] RN Care Coordinator [phone]

## 2022-10-07 NOTE — Patient Outreach (Signed)
  Medicaid Managed Care   Unsuccessful Attempt Note   10/07/2022 Name: Chris [name] MRN: [id number] DOB: 1960-06-19  Referred by: [name], MD Reason for referral : High Risk Managed Medicaid (Unsuccessful RNCM telephone outreach)   An unsuccessful telephone outreach was attempted today. The patient was referred to the case management team for assistance with care management and care coordination.    Follow Up Plan: A HIPAA compliant phone message was left for the patient providing contact information and requesting a return call. and The Managed Medicaid care management team will reach out to the patient again over the next 7 days.    Estanislado Emms RN, BSN [place]  Managed [facility] RN Care Coordinator [phone]

## 2022-10-15 ENCOUNTER — Other Ambulatory Visit: Payer: Self-pay

## 2022-10-15 ENCOUNTER — Ambulatory Visit: Payer: Self-pay | Admitting: *Deleted

## 2022-10-15 NOTE — Telephone Encounter (Signed)
Reason for Disposition  Pain in the big toe joint  Answer Assessment - Initial Assessment Questions 1. ONSET: "When did the pain start?"      My great big toe is so swollen and painful .   I can't walk.   Started yesterday 2. LOCATION: "Where is the pain located?"   (e.g., around nail, entire toe, at foot joint)      Big toe on right foot at the knuckle 3. PAIN: "How bad is the pain?"    (Scale 1-10; or mild, moderate, severe)   -  MILD (1-3): doesn't interfere with normal activities    -  MODERATE (4-7): interferes with normal activities (e.g., work or school) or awakens from sleep, limping    -  SEVERE (8-10): excruciating pain, unable to do any normal activities, unable to walk     Severe 4. APPEARANCE: "What does the toe look like?" (e.g., redness, swelling, bruising, pallor)     It's swollen 5. CAUSE: "What do you think is causing the toe pain?"     Gout flare up 6. OTHER SYMPTOMS: "Do you have any other symptoms?" (e.g., leg pain, rash, fever, numbness)     Swollen and red.   I've got it elevated.   If the sheet hits it it hurts so bad. 7. PREGNANCY: "Is there any chance you are pregnant?" "When was your last menstrual period?"     N/A  Protocols used: Toe Pain-A-AH

## 2022-10-15 NOTE — Telephone Encounter (Signed)
Spoke with patient . Verified name & DOB   Patient voiced that he has gout in his big toe and is requesting  Colchicine. Advised patient PCP is not allowing refills due to the interaction of the colchicine and Coreg. Advised patient to take allopurinol as ordered. Patient voiced that he has been taking allopurinol and it not helping. Patient asked what can he do about the pain he having in his big toe. Voiced that he has taken tylenol and nothing helping. Advised patient to got to UC or myChart visit as his PCP is out of the office and there are not available appointment with any other providers. Patient aggreed to Mychart visit for today. Patient voiced that he is able to schedule himself.

## 2022-10-15 NOTE — Telephone Encounter (Signed)
  Chief Complaint: Severe gout pain in right big toe joint. Symptoms: Red, swollen and very painful.  Can't stand for the sheet to even touch it. Frequency: Started hurting yesterday 5/2 Pertinent Negatives: Patient denies being able to take ibuprofen due to health reasons and Tylenol not helping. Disposition: [] ED /[] Urgent Care (no appt availability in office) / [] Appointment(In office/virtual)/ []  [place] [facility]/ [] Home Care/ [] Refused Recommended Disposition /[] [facility] [facility]/ [x]  Follow-up with PCP Additional Notes: No appts available at [facility] and the [facility] not running today.   He was last seen by Dr. Jonah Blue 08/09/2022 and gout is listed in the note.   He is requesting the Colchicine be refilled because it really helps with the gout flare ups.    I see in the note where it was not refilled due to him being on Coreg. Needing something for the severe gout pain.   Please call into the pharmacy at [facility].     I have sent a high priority note to [facility].

## 2022-10-18 ENCOUNTER — Ambulatory Visit
Admission: RE | Admit: 2022-10-18 | Discharge: 2022-10-18 | Disposition: A | Discharge status: Home / Self Care | Payer: Medicaid Other | Source: Ambulatory Visit | Attending: Interventional Radiology | Admitting: Interventional Radiology

## 2022-10-18 DIAGNOSIS — R160 Hepatomegaly, not elsewhere classified: Secondary | ICD-10-CM | POA: Diagnosis not present

## 2022-10-18 DIAGNOSIS — I700 Atherosclerosis of aorta: Secondary | ICD-10-CM | POA: Diagnosis not present

## 2022-10-18 DIAGNOSIS — N281 Cyst of kidney, acquired: Secondary | ICD-10-CM | POA: Diagnosis not present

## 2022-10-18 DIAGNOSIS — C641 Malignant neoplasm of right kidney, except renal pelvis: Secondary | ICD-10-CM

## 2022-10-18 MED ORDER — GADOPICLENOL 0.5 MMOL/ML IV SOLN
10.0000 mL | Freq: Once | INTRAVENOUS | Status: AC | PRN
  Administered 2022-10-18: 10 mL via INTRAVENOUS

## 2022-10-20 ENCOUNTER — Encounter: Payer: Self-pay | Admitting: Internal Medicine

## 2022-10-20 ENCOUNTER — Other Ambulatory Visit: Payer: Self-pay

## 2022-10-20 ENCOUNTER — Other Ambulatory Visit: Payer: Self-pay | Admitting: Internal Medicine

## 2022-10-20 ENCOUNTER — Encounter: Payer: Self-pay | Admitting: *Deleted

## 2022-10-20 ENCOUNTER — Other Ambulatory Visit: Payer: Medicaid Other | Admitting: *Deleted

## 2022-10-20 DIAGNOSIS — J449 Chronic obstructive pulmonary disease, unspecified: Secondary | ICD-10-CM

## 2022-10-20 DIAGNOSIS — J439 Emphysema, unspecified: Secondary | ICD-10-CM

## 2022-10-20 MED ORDER — ALBUTEROL SULFATE HFA 108 (90 BASE) MCG/ACT IN AERS
2.0000 | INHALATION_SPRAY | Freq: Four times a day (QID) | RESPIRATORY_TRACT | 0 refills | Status: DC | PRN
  Filled 2022-10-20: qty 54, 81d supply, fill #0
  Filled 2022-10-21: qty 54, 75d supply, fill #0
  Filled 2022-11-26: qty 18, 25d supply, fill #0

## 2022-10-20 MED ORDER — ALBUTEROL SULFATE (2.5 MG/3ML) 0.083% IN NEBU
2.5000 mg | INHALATION_SOLUTION | RESPIRATORY_TRACT | 0 refills | Status: DC | PRN
  Filled 2022-10-20: qty 300, 17d supply, fill #0

## 2022-10-20 MED ORDER — BUDESONIDE-FORMOTEROL FUMARATE 160-4.5 MCG/ACT IN AERO
2.0000 | INHALATION_SPRAY | Freq: Two times a day (BID) | RESPIRATORY_TRACT | 0 refills | Status: DC
Start: 2022-10-20 — End: 2022-12-08
  Filled 2022-10-20 – 2022-11-26 (×3): qty 30.6, 90d supply, fill #0

## 2022-10-20 MED ORDER — MONTELUKAST SODIUM 10 MG PO TABS
10.0000 mg | ORAL_TABLET | Freq: Every day | ORAL | 0 refills | Status: DC
Start: 2022-10-20 — End: 2022-12-08
  Filled 2022-10-20 – 2022-11-26 (×2): qty 90, 90d supply, fill #0

## 2022-10-20 NOTE — Patient Instructions (Signed)
Visit Information  Chris [name] was given information about Medicaid Managed Care team care coordination services as a part of their Healthy [facility] Medicaid benefit. Chris [name] verbally consented to engagement with the [facility] Managed Care team.   If you are experiencing a medical emergency, please call 911 or report to your local emergency department or urgent care.   If you have a non-emergency medical problem during routine business hours, please contact your provider's office and ask to speak with a nurse.   For questions related to your Healthy [facility] health plan, please call: [phone] or visit the homepage here: [email or web address]  If you would like to schedule transportation through your Healthy [facility] plan, please call the following number at least 2 days in advance of your appointment: [phone]  For information about your ride after you set it up, call Ride Assist at [phone]. Use this number to activate a Will Call pickup, or if your transportation is late for a scheduled pickup. Use this number, too, if you need to make a change or cancel a previously scheduled reservation.  If you need transportation services right away, call [phone]. The after-hours call center is staffed 24 hours to handle ride assistance and urgent reservation requests (including discharges) 365 days a year. Urgent trips include sick visits, hospital discharge requests and life-sustaining treatment.  Call the [facility] Line at [phone], at any time, 24 hours a day, 7 days a week. If you are in danger or need immediate medical attention call 911.  If you would like help to quit smoking, call 1-800-QUIT-NOW (850-752-2407) OR Espaol: 1-855-Djelo-Ya (2-951-884-1660) o para ms informacin haga clic aqu or Text READY to 630-160 to register via text  Chris [name],   Please see education materials related to COPD provided as  print materials.   The patient verbalized understanding of instructions, educational materials, and care plan provided today and agreed to receive a mailed copy of patient instructions, educational materials, and care plan.   Telephone follow up appointment with Managed Medicaid care management team member scheduled for:11/23/22 @ 9am  Chris Emms RN, BSN [place]  Managed [facility] RN Care Coordinator [phone]   Following is a copy of your plan of care:  Care Plan : RN Care Manager Plan of Care  Updates made by [name], RN since 10/20/2022 12:00 AM     Problem: Health Management Needs Related to Heart Failure and Chronic Pain   Priority: High  Note:   Current Barriers:    RNCM Clinical Goal(s):  Patient will take all medications exactly as prescribed and will call provider for medication related questions as evidenced by medication review and patient interview demonstrate Ongoing adherence to prescribed treatment plan for CHF as evidenced by medication review continue to work with RN Care Manager to address care management and care coordination needs related to  CHF as evidenced by adherence to CM Team Scheduled appointments work with social worker to address  related to the management of Limited social support, Transportation, and Lack of essential utilities - light/power* related to the management of CHF as evidenced by review of EMR and patient or Child psychotherapist report through collaboration with Medical illustrator, provider, and care team.   Interventions: Collaboration with LCSW/[name]/Care Guide and Heart Failure RN Inter-disciplinary care team collaboration (see longitudinal plan of care) Evaluation of current treatment plan related to  self management and patient's adherence to plan as established by provider   Heart Failure Interventions:  (Status:  New goal.) Long Term Goal Discussed importance of daily weight and advised patient to weigh and record daily Reviewed  role of diuretics in prevention of fluid overload and management of heart failure; Discussed the importance of keeping all appointments with provider Assessed social determinant of health barriers  Discussed importance of self assessment reporting of swelling, increased shortness of breath, chest pain, dizziness, or other new or worsened symptom  Pain Interventions:  (Status:  New goal.) Short Term Goal Pain assessment performed Medications reviewed Reviewed provider established plan for pain management Discussed importance of adherence to all scheduled medical appointments Counseled on the importance of reporting any/all new or changed pain symptoms or management strategies to pain management provider Reviewed with patient prescribed pharmacological and nonpharmacological pain relief strategies Assessed social determinant of health barriers Advised patient that the CM team would discuss pain management needs with provider and assist with connection to pain management provider  Patient Goals/Self-Care Activities: Take all medications as prescribed Attend all scheduled provider appointments Call provider office for new concerns or questions  Work with the social worker to address care coordination needs and will continue to work with the clinical team to address health care and disease management related needs call 1-800-273-TALK (toll free, 24 hour hotline) if experiencing a Mental Health or Behavioral Health Crisis   Follow Up Plan:  Telephone follow up appointment with care management team member scheduled for:  07/27/22 10am      Long-Range Goal: Independent self health management of Heart Failure and Chronic Pain   Start Date: 07/23/2022  Expected End Date: 10/21/2022  Recent Progress: On track  Priority: High  Note:   Current Barriers:  Knowledge Deficits related to plan of care for management of CHF and Chronic Pain Care Coordination needs related to Transportation  Non-adherence  to prescribed medication regimen Difficulty obtaining medications  RNCM Clinical Goal(s):  Patient will take all medications exactly as prescribed and will call provider for medication related questions as evidenced by medication review and patient interview demonstrate Ongoing adherence to prescribed treatment plan for CHF as evidenced by medication review continue to work with RN Care Manager to address care management and care coordination needs related to  CHF as evidenced by adherence to CM Team Scheduled appointments work with Child psychotherapist to address  related to the management of Limited social support, Transportation, and Lack of essential utilities - light/power* related to the management of CHF as evidenced by review of EMR and patient or Child psychotherapist report through collaboration with Medical illustrator, provider, and care team.   Interventions: Inter-disciplinary care team collaboration (see longitudinal plan of care) Evaluation of current treatment plan related to  self management and patient's adherence to plan as established by provider Reviewed provider note from recent Nephrology note Collaborated with Pharmacy to inquire about charge account and request refills of medications and inhalers-patient unable to charge, as he currently has $318 on charge account and is past due Reviewed upcoming appointments including: 10/25/22 with LCSW, 10/27/22 for phone visit to review MRI results, 11/02/22 for Echocardiogram, 11/09/22 with HF Clinic, 11/11/22 with Cardiology and 12/14/22 with PCP   COPD Interventions:  (Status:  New goal.) Long Term Goal Provided patient with basic written and verbal COPD education on self care/management/and exacerbation prevention Provided education about and advised patient to utilize infection prevention strategies to reduce risk of respiratory infection Discussed the importance of adequate rest and management of fatigue with COPD Assessed social determinant of health  barriers Advised patient  to contact [phone] to schedule PFT and [phone] to schedule follow up with Pulmonology Pharmacy will request refills for inhalers   Heart Failure Interventions:  (Status: stable, not addressed today) Long Term Goal Discussed importance of daily weight and advised patient to weigh and record daily Reviewed role of diuretics in prevention of fluid overload and management of heart failure; Discussed the importance of keeping all appointments with provider Assessed social determinant of health barriers  Discussed importance of self assessment reporting of swelling, increased shortness of breath, chest pain, dizziness, or other new or worsened symptom Reviewed upcoming appointment with Dr. Shirlee Latch on 09/28/22-patient planning to arrange transportation Advised patient to request refills on any needed medications  Pain Interventions:  (Status:  stable, not addressed today) Long Term Goal Pain assessment performed Medications reviewed: discussed Cymbalta-patient needs to pick up new dose as prescribed by Psychiatry Reviewed provider established plan for pain management Discussed importance of adherence to all scheduled medical appointments Counseled on the importance of reporting any/all new or changed pain symptoms or management strategies to pain management provider Reviewed with patient prescribed pharmacological and nonpharmacological pain relief strategies Assessed social determinant of health barriers Discussed the benefits of cymbalta  Patient Goals/Self-Care Activities: Take all medications as prescribed Attend all scheduled provider appointments Call provider office for new concerns or questions  Work with the social worker to address care coordination needs and will continue to work with the clinical team to address health care and disease management related needs call 1-800-273-TALK (toll free, 24 hour hotline) if experiencing a Mental Health or  Behavioral Health Crisis   Follow Up Plan:  Telephone follow up appointment with care management team member scheduled for:  11/23/22 @ 9am

## 2022-10-20 NOTE — Progress Notes (Signed)
Note has been received from Dr. Mosetta Pigeon with [facility] in [place].  Patient was seen 10/07/2022. 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  Continue aspirin, Lipitor, and [place]. 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  Follow-up with urology and interventional radiology. OSA-refer to pulmonary for evaluation of sleep apnea on CPAP titration study. Follow-up in 6 months.

## 2022-10-20 NOTE — Patient Outreach (Signed)
Medicaid Managed Care   Nurse Care Manager Note  10/20/2022 Name:  MIO HOLAN MRN:  [id number] DOB:  1961-02-21  DAYTIN MADDY is an 62 y.o. year old male who is a primary patient of [name], MD.  The [facility] Managed Care Coordination team was consulted for assistance with:    CHF COPD  Mr. Lorig was given information about Medicaid Managed Care Coordination team services today. [name] Patient agreed to services and verbal consent obtained.  Engaged with patient by telephone for follow up visit in response to provider referral for case management and/or care coordination services.   Assessments/Interventions:  Review of past medical history, allergies, medications, health status, including review of consultants reports, laboratory and other test data, was performed as part of comprehensive evaluation and provision of chronic care management services.  SDOH (Social Determinants of Health) assessments and interventions performed: SDOH Interventions    Flowsheet Row Patient Outreach Telephone from 10/20/2022 in [place] POPULATION HEALTH DEPARTMENT Patient Outreach Telephone from 08/27/2022 in [place] POPULATION HEALTH DEPARTMENT Patient Outreach Telephone from 08/19/2022 in [place] POPULATION HEALTH DEPARTMENT Patient Outreach Telephone from 07/27/2022 in [place] POPULATION HEALTH DEPARTMENT Patient Outreach Telephone from 07/23/2022 in [place] POPULATION HEALTH DEPARTMENT Patient Outreach Telephone from 07/08/2022 in [place] POPULATION HEALTH DEPARTMENT  SDOH Interventions        Housing Interventions -- -- -- -- Intervention Not Indicated --  Transportation Interventions Payor Benefit -- -- -- Other (Comment)  Cabin crew arranged by MM Care Guide for upcoming appointments] --  Utilities Interventions -- -- -- -- Other (Comment)  [MM Social WOrk team consulted] --  Depression Interventions/Treatment  -- -- -- -- -- Referral to  Psychiatry  Financial Strain Interventions -- -- -- -- Other (Comment)  [MM Social Work Support] --  Stress Interventions -- Environmental health practitioner, Provide Counseling Offered [employer], Provide Counseling Provide Counseling, Warehouse manager Wellness Resources -- [employer], Provide Counseling  Social Connections Interventions -- -- -- -- Other (Comment)  [SW team referral] --       Care Plan  No Known Allergies  Medications Reviewed Today     Reviewed by [name], RN (Registered Nurse) on 10/20/22 at [phone]  Med List Status: <None>   Medication Order Taking? Sig Documenting Provider Last Dose Status Informant  Accu-Chek Softclix Lancets lancets [id number]  Use as instructed [name], MD  Active Self  acetaminophen (TYLENOL) 500 MG tablet [id number]  Take 500-1,000 mg by mouth every 6 (six) hours as needed (pain.). [provider]  Active Self  albuterol (PROVENTIL) (2.5 MG/3ML) 0.083% nebulizer solution [id number]  Take 3 mLs (2.5 mg total) by nebulization every 4 (four) hours as needed for wheezing or shortness of breath. Alford Highland, MD  Active Self  albuterol (VENTOLIN HFA) 108 (90 Base) MCG/ACT inhaler [id number]  Inhale 2 puffs into the lungs every 6 (six) hours as needed for wheezing or shortness of breath. Alford Highland, MD  Active Self  allopurinol (ZYLOPRIM) 100 MG tablet [id number]  TAKE 2 TABLETS (200 MG TOTAL) BY MOUTH DAILY. [name], MD  Active Self  apixaban (ELIQUIS) 5 MG TABS tablet [id number]  Take 1 tablet (5 mg total) by mouth 2 (two) times daily. [name], MD  Active            Med Note Ardelia Mems, Dallan Schonberg A   Mon Sep 06, 2022 10:58 AM)  atorvastatin (LIPITOR) 40 MG tablet [id number]  Take 1 tablet (40 mg total) by mouth daily. Delma Freeze, FNP  Active Self  Blood Glucose Monitoring Suppl (ACCU-CHEK GUIDE) w/Device Andria Rhein [id number]  Use as directed [name], MD  Active Self  budesonide-formoterol [facility]) 160-4.5 MCG/ACT inhaler [id number]  Inhale 2 puffs into the lungs 2 (two) times daily. Alford Highland, MD  Active Self  carvedilol (COREG) 6.25 MG tablet [id number]  Take 1.5 tablets (9.375 mg total) by mouth 2 (two) times daily. Laurey Morale, MD  Active   colchicine 0.6 MG tablet [id number]  Take 0.5 tablets (0.3 mg total) by mouth three times a week. [name], MD  Active   dapagliflozin propanediol (FARXIGA) 10 MG TABS tablet [id number]  Take 1 tablet (10 mg total) by mouth daily. Clarisa Kindred A, FNP  Active Self  DULoxetine (CYMBALTA) 30 MG capsule [id number]  Take 1 capsule (30 mg total) by mouth daily. Park Pope, MD  Active   glucose blood (ACCU-CHEK GUIDE) test strip [id number]  Use as directed to check blood sugar 1-2 times a day [name], MD  Active Self  isosorbide-hydrALAZINE (BIDIL) 20-37.5 MG tablet [id number]  Take 1 tablet by mouth 3 (three) times daily. [name], MD  Active     Discontinued 07/11/20 [phone] (Discontinued by provider)   montelukast (SINGULAIR) 10 MG tablet [id number]  TAKE 1 TABLET (10 MG TOTAL) BY MOUTH AT BEDTIME. Alford Highland, MD  Active Self  oxyCODONE-acetaminophen (PERCOCET) 5-325 MG tablet [id number]  Take 1 tablet by mouth every 4 (four) hours as needed for severe pain.  Patient not taking: Reported on 09/28/2022   Minna Antis, MD  Active   potassium chloride (KLOR-CON M) 10 MEQ tablet [id number]  Take 2 tablets (20 mEq total) by mouth daily. Laurey Morale, MD  Active   Tiotropium Bromide Monohydrate (SPIRIVA RESPIMAT) 2.5 MCG/ACT AERS [id number]  Inhale 2 puffs into the lungs daily. Alford Highland, MD  Active Self  torsemide (DEMADEX) 20 MG tablet [id number]  Take 40 mg by mouth daily. [provider]  Active   traZODone (DESYREL) 50 MG tablet [id number]  Take 1-2 tablets (50-100 mg total) by mouth at bedtime. Park Pope, MD  Active             Patient  Active Problem List   Diagnosis Date Noted   Post traumatic stress disorder 09/23/2022   MDD (major depressive disorder), recurrent episode, moderate (HCC) 08/26/2022   Stage 3b chronic kidney disease (HCC) 08/09/2022   Hypertrophic cardiomyopathy (HCC) 08/09/2022   History of renal cell cancer 08/09/2022   Chronic heart failure with preserved ejection fraction (HFpEF) (HCC) 07/13/2022   Agitation 04/23/2022   RSV (respiratory syncytial virus pneumonia) 04/22/2022   Obesity (BMI 30-39.9) 04/21/2022   COPD exacerbation (HCC) 04/19/2022   Dyslipidemia 04/19/2022   Gout 04/19/2022   Right kidney mass 05/14/2021   CHF exacerbation (HCC) 04/14/2021   Chest pain 04/14/2021   Syncope 04/14/2021   Left-sided weakness 10/28/2020   Typical atrial flutter (HCC)    CHF (congestive heart failure) (HCC) 07/04/2020   Acute exacerbation of CHF (congestive heart failure) (HCC) 06/16/2020   Influenza vaccine refused 05/06/2020   Acute decompensated heart failure (HCC) 05/04/2020   Illiteracy 05/04/2020   Type 2 diabetes mellitus with stage 3 chronic kidney disease (HCC) 12/25/2019   Elevated troponin I level 10/26/2019   History of gout 02/01/2019   Seasonal allergic rhinitis due to pollen 02/01/2019  Tobacco dependence 11/30/2018   Microscopic hematuria 11/30/2018   Depression 11/30/2018   Difficulty controlling anger 11/30/2018   COPD (chronic obstructive pulmonary disease) (HCC)    CKD (chronic kidney disease) stage 3, GFR 30-59 ml/min (HCC) 08/10/2018   Recurrent epistaxis 04/21/2018   Mixed hyperlipidemia 07/28/2017   Essential hypertension 07/28/2017   Chronic systolic heart failure (HCC) 10/25/2014   Cocaine abuse (HCC) 02/20/2013   Cannabis abuse 02/20/2013   Back pain, chronic 02/20/2013    Conditions to be addressed/monitored per PCP order:  CHF and COPD  Care Plan : RN Care Manager Plan of Care  Updates made by [name], RN since 10/20/2022 12:00 AM     Problem:  Health Management Needs Related to Heart Failure and Chronic Pain   Priority: High  Note:   Current Barriers:    RNCM Clinical Goal(s):  Patient will take all medications exactly as prescribed and will call provider for medication related questions as evidenced by medication review and patient interview demonstrate Ongoing adherence to prescribed treatment plan for CHF as evidenced by medication review continue to work with RN Care Manager to address care management and care coordination needs related to  CHF as evidenced by adherence to CM Team Scheduled appointments work with Child psychotherapist to address  related to the management of Limited social support, Transportation, and Lack of essential utilities - light/power* related to the management of CHF as evidenced by review of EMR and patient or Child psychotherapist report through collaboration with Medical illustrator, provider, and care team.   Interventions: Collaboration with LCSW/[name]/Care Guide and Heart Failure RN Inter-disciplinary care team collaboration (see longitudinal plan of care) Evaluation of current treatment plan related to  self management and patient's adherence to plan as established by provider   Heart Failure Interventions:  (Status:  New goal.) Long Term Goal Discussed importance of daily weight and advised patient to weigh and record daily Reviewed role of diuretics in prevention of fluid overload and management of heart failure; Discussed the importance of keeping all appointments with provider Assessed social determinant of health barriers  Discussed importance of self assessment reporting of swelling, increased shortness of breath, chest pain, dizziness, or other new or worsened symptom  Pain Interventions:  (Status:  New goal.) Short Term Goal Pain assessment performed Medications reviewed Reviewed provider established plan for pain management Discussed importance of adherence to all scheduled medical  appointments Counseled on the importance of reporting any/all new or changed pain symptoms or management strategies to pain management provider Reviewed with patient prescribed pharmacological and nonpharmacological pain relief strategies Assessed social determinant of health barriers Advised patient that the CM team would discuss pain management needs with provider and assist with connection to pain management provider  Patient Goals/Self-Care Activities: Take all medications as prescribed Attend all scheduled provider appointments Call provider office for new concerns or questions  Work with the social worker to address care coordination needs and will continue to work with the clinical team to address health care and disease management related needs call 1-800-273-TALK (toll free, 24 hour hotline) if experiencing a Mental Health or Behavioral Health Crisis   Follow Up Plan:  Telephone follow up appointment with care management team member scheduled for:  07/27/22 10am      Long-Range Goal: Independent self health management of Heart Failure and Chronic Pain   Start Date: 07/23/2022  Expected End Date: 10/21/2022  Recent Progress: On track  Priority: High  Note:   Current Barriers:  Knowledge Deficits related to plan of care for management of CHF and Chronic Pain Care Coordination needs related to Transportation  Non-adherence to prescribed medication regimen Difficulty obtaining medications  RNCM Clinical Goal(s):  Patient will take all medications exactly as prescribed and will call provider for medication related questions as evidenced by medication review and patient interview demonstrate Ongoing adherence to prescribed treatment plan for CHF as evidenced by medication review continue to work with RN Care Manager to address care management and care coordination needs related to  CHF as evidenced by adherence to CM Team Scheduled appointments work with Child psychotherapist to address   related to the management of Limited social support, Transportation, and Lack of essential utilities - light/power* related to the management of CHF as evidenced by review of EMR and patient or Child psychotherapist report through collaboration with Medical illustrator, provider, and care team.   Interventions: Inter-disciplinary care team collaboration (see longitudinal plan of care) Evaluation of current treatment plan related to  self management and patient's adherence to plan as established by provider Reviewed provider note from recent Nephrology note Collaborated with Pharmacy to inquire about charge account and request refills of medications and inhalers-patient unable to charge, as he currently has $318 on charge account and is past due Reviewed upcoming appointments including: 10/25/22 with LCSW, 10/27/22 for phone visit to review MRI results, 11/02/22 for Echocardiogram, 11/09/22 with HF Clinic, 11/11/22 with Cardiology and 12/14/22 with PCP   COPD Interventions:  (Status:  New goal.) Long Term Goal Provided patient with basic written and verbal COPD education on self care/management/and exacerbation prevention Provided education about and advised patient to utilize infection prevention strategies to reduce risk of respiratory infection Discussed the importance of adequate rest and management of fatigue with COPD Assessed social determinant of health barriers Advised patient to contact [phone] to schedule PFT and [phone] to schedule follow up with Pulmonology Pharmacy will request refills for inhalers   Heart Failure Interventions:  (Status: stable, not addressed today) Long Term Goal Discussed importance of daily weight and advised patient to weigh and record daily Reviewed role of diuretics in prevention of fluid overload and management of heart failure; Discussed the importance of keeping all appointments with provider Assessed social determinant of health barriers  Discussed importance  of self assessment reporting of swelling, increased shortness of breath, chest pain, dizziness, or other new or worsened symptom Reviewed upcoming appointment with Dr. Shirlee Latch on 09/28/22-patient planning to arrange transportation Advised patient to request refills on any needed medications  Pain Interventions:  (Status:  stable, not addressed today) Long Term Goal Pain assessment performed Medications reviewed: discussed Cymbalta-patient needs to pick up new dose as prescribed by Psychiatry Reviewed provider established plan for pain management Discussed importance of adherence to all scheduled medical appointments Counseled on the importance of reporting any/all new or changed pain symptoms or management strategies to pain management provider Reviewed with patient prescribed pharmacological and nonpharmacological pain relief strategies Assessed social determinant of health barriers Discussed the benefits of cymbalta  Patient Goals/Self-Care Activities: Take all medications as prescribed Attend all scheduled provider appointments Call provider office for new concerns or questions  Work with the social worker to address care coordination needs and will continue to work with the clinical team to address health care and disease management related needs call 1-800-273-TALK (toll free, 24 hour hotline) if experiencing a Mental Health or Behavioral Health Crisis   Follow Up Plan:  Telephone follow up appointment with care management team member  scheduled for:  11/23/22 @ 9am      Follow Up:  Patient agrees to Care Plan and Follow-up.  Plan: The Managed Medicaid care management team will reach out to the patient again over the next 30 days.  Date/time of next scheduled RN care management/care coordination outreach:  11/23/22 @ 9am  Estanislado Emms RN, BSN [place]  Managed Medicaid RN Care Coordinator [phone]

## 2022-10-20 NOTE — Telephone Encounter (Signed)
Requested medication (s) are due for refill today: yes  Requested medication (s) are on the active medication list: yes  Last refill:  04/26/22  Future visit scheduled: yes  Notes to clinic:  Unable to refill per protocol, last refill by another provider. Last refilled by provider in the ED, routing for approval.     Requested Prescriptions  Pending Prescriptions Disp Refills   budesonide-formoterol (SYMBICORT) 160-4.5 MCG/ACT inhaler 10.2 g 0    Sig: Inhale 2 puffs into the lungs 2 (two) times daily.     Pulmonology:  Combination Products Passed - 10/20/2022 10:10 AM      Passed - Valid encounter within last 12 months    Recent Outpatient Visits           2 months ago Type 2 diabetes mellitus with obesity [facility])   [place] [facility] [name], MD   5 months ago Hospital discharge follow-up   [facility] Jonah Blue B, MD   10 months ago Type 2 diabetes mellitus with obesity [facility])   [place] [facility] Jonah Blue B, MD   1 year ago Type 2 diabetes mellitus with [facility])   [place] [facility] [name], MD   1 year ago SOB (shortness of breath)   [place] [facility] [name], MD       Future Appointments             In 1 week Raechel Chute, MD [facility] Pulmonary Care at [place]   In 3 weeks [name], MD [facility] at [place]   In 1 month [name], MD [facility]             albuterol (VENTOLIN HFA) 108 (90 Base) MCG/ACT inhaler 6.7 g 0    Sig: Inhale 2 puffs into the lungs every 6 (six) hours as needed for wheezing or shortness of breath.     Pulmonology:  Beta Agonists 2 Failed - 10/20/2022 10:10 AM      Failed - Last BP in normal range    BP Readings from Last 1 Encounters:  09/28/22 (!) 144/84          Passed - Last Heart Rate in normal range    Pulse Readings from Last 1 Encounters:  09/28/22 79         Passed - Valid encounter within last 12 months    Recent Outpatient Visits           2 months ago Type 2 diabetes mellitus with obesity [facility])   [place] [facility] [name], MD   5 months ago Hospital discharge follow-up   [facility] Jonah Blue B, MD   10 months ago Type 2 diabetes mellitus with obesity [facility])   [place] [facility] Jonah Blue B, MD   1 year ago Type 2 diabetes mellitus with obesity [facility])   [place] [facility] [name], MD   1 year ago SOB (shortness of breath)   [place] [facility] [name], MD       Future Appointments             In 1 week Raechel Chute, MD [facility] Pulmonary Care  at [place]   In 3 weeks Agbor-Etang, Arlys John, MD [facility] HeartCare at [place]   In 1 month [name], MD [facility]             albuterol (PROVENTIL) (2.5 MG/3ML) 0.083% nebulizer solution 300 mL 0    Sig: Take 3 mLs (2.5 mg total) by nebulization every 4 (four) hours as needed for wheezing or shortness of breath.     Pulmonology:  Beta Agonists 2 Failed - 10/20/2022 10:10 AM      Failed - Last BP in normal range    BP Readings from Last 1 Encounters:  09/28/22 (!) 144/84         Passed - Last Heart Rate in normal range    Pulse Readings from Last 1 Encounters:  09/28/22 79         Passed - Valid encounter within last 12 months    Recent Outpatient Visits           2 months ago Type 2 diabetes mellitus with obesity (HCC)   [place] [facility] [name], MD   5 months ago Hospital discharge follow-up   [facility] Jonah Blue B, MD   10 months  ago Type 2 diabetes mellitus with obesity [facility])   [employer] [facility] Jonah Blue B, MD   1 year ago Type 2 diabetes mellitus with obesity [facility])   [place] [facility] [name], MD   1 year ago SOB (shortness of breath)   [place] [facility] [name], MD       Future Appointments             In 1 week Raechel Chute, MD [facility]monary Care at [place]   In 3 weeks [name], MD [facility] HeartCare at [place]   In 1 month [name], MD [facility]             montelukast (SINGULAIR) 10 MG tablet 30 tablet 0    Sig: TAKE 1 TABLET (10 MG TOTAL) BY MOUTH AT BEDTIME.     Pulmonology:  Leukotriene Inhibitors Passed - 10/20/2022 10:10 AM      Passed - Valid encounter within last 12 months    Recent Outpatient Visits           2 months ago Type 2 diabetes mellitus with obesity [facility])   [place] [facility] [name], MD   5 months ago Hospital discharge follow-up   [facility] Blue B, MD   10 months ago Type 2 diabetes mellitus with obesity [facility])   [place] [facility] [name], MD   1 year ago Type 2 diabetes mellitus with obesity [facility])   [place] [facility] [name], MD   1 year ago SOB (shortness of breath)   [place] [facility] [name], MD       Future Appointments             In 1 week Raechel Chute, MD [facility] Pulmonary Care at [place]   In 3 weeks Agbor-Etang, Arlys John, MD [place] [facility] at [place]   In 1 month [name], MD  [facility]

## 2022-10-21 ENCOUNTER — Other Ambulatory Visit: Payer: Self-pay

## 2022-10-25 ENCOUNTER — Other Ambulatory Visit: Payer: Medicaid Other | Admitting: Licensed Clinical Social Worker

## 2022-10-25 NOTE — Patient Instructions (Signed)
Visit Information  Chris [name] was given information about Medicaid Managed Care team care coordination services as a part of their [employer] Medicaid benefit. Chris [name] verbally consented to engagement with the Medicaid Managed Care team.   If you are experiencing a medical emergency, please call 911 or report to your local emergency department or urgent care.   If you have a non-emergency medical problem during routine business hours, please contact your provider's office and ask to speak with a nurse.   For questions related to your [employer] Medicaid health plan, please call: [phone] or visit the homepage here: [email or web address]  If you would like to schedule transportation through your Healthy [employer] Medicaid plan, please call the following number at least 2 days in advance of your appointment: [phone]  For information about your ride after you set it up, call Ride Assist at [phone]. Use this number to activate a Will Call pickup, or if your transportation is late for a scheduled pickup. Use this number, too, if you need to make a change or cancel a previously scheduled reservation.  If you need transportation services right away, call [phone]. The after-hours call center is staffed 24 hours to handle ride assistance and urgent reservation requests (including discharges) 365 days a year. Urgent trips include sick visits, hospital discharge requests and life-sustaining treatment.  Call the Behavioral Health Crisis Line at [phone], at any time, 24 hours a day, 7 days a week. If you are in danger or need immediate medical attention call 911.  If you would like help to quit smoking, call 1-800-QUIT-NOW (1-800-784-8669) OR Espaol: 1-855-Djelo-Ya (1-855-335-3569) o para ms informacin haga clic aqu or Text READY to 200-400 to register via text   

## 2022-10-25 NOTE — Patient Outreach (Addendum)
Medicaid Managed Care Social Work Note  10/25/2022 Name:  Chris [name] MRN:  [id number] DOB:  [birth year]  Chris [name] is an 62 y.o. year old male who is a primary patient of [name], MD.  The Medicaid Managed Care Coordination team was consulted for assistance with:  Mental Health Counseling and Resources  Chris [name] was given information about Medicaid Managed Care Coordination team services today. Chris [name] Patient agreed to services and verbal consent obtained.  Engaged with patient  for by telephone forfollow up visit in response to referral for case management and/or care coordination services.   Assessments/Interventions:  Review of past medical history, allergies, medications, health status, including review of consultants reports, laboratory and other test data, was performed as part of comprehensive evaluation and provision of chronic care management services.  SDOH: (Social Determinant of Health) assessments and interventions performed: SDOH Interventions    Flowsheet Row Patient Outreach Telephone from 10/25/2022 in [place] POPULATION HEALTH DEPARTMENT Patient Outreach Telephone from 10/20/2022 in [place] POPULATION HEALTH DEPARTMENT Patient Outreach Telephone from 08/27/2022 in [place] POPULATION HEALTH DEPARTMENT Patient Outreach Telephone from 08/19/2022 in [place] POPULATION HEALTH DEPARTMENT Patient Outreach Telephone from 07/27/2022 in [place] POPULATION HEALTH DEPARTMENT Patient Outreach Telephone from 07/23/2022 in [place] POPULATION HEALTH DEPARTMENT  SDOH Interventions        Housing Interventions -- -- -- -- -- Intervention Not Indicated  Transportation Interventions -- Payor Benefit -- -- -- Other (Comment)  Cabin crew arranged by MM Care Guide for upcoming appointments]  Utilities Interventions -- -- -- -- -- Other (Comment)  [MM Social WOrk team consulted]  Corporate treasurer Interventions -- -- -- -- --  Other (Comment)  [MM Social Work Support]  Stress Interventions Provide Counseling, Ambulance person Resources -- [employer], Provide Counseling Offered [employer], Provide Counseling Provide Counseling, Offered [employer] Resources --  Social Connections Interventions -- -- -- -- -- Other (Comment)  [SW team referral]       Advanced Directives Status:  See Care Plan for related entries.  Care Plan                 No Known Allergies  Medications Reviewed Today     Reviewed by [name], RN (Registered Nurse) on 10/20/22 at [phone]  Med List Status: <None>   Medication Order Taking? Sig Documenting Provider Last Dose Status Informant  Accu-Chek Softclix Lancets lancets [id number]  Use as instructed [name], MD  Active Self  acetaminophen (TYLENOL) 500 MG tablet [id number]  Take 500-1,000 mg by mouth every 6 (six) hours as needed (pain.). [provider]  Active Self  albuterol (PROVENTIL) (2.5 MG/3ML) 0.083% nebulizer solution [id number]  Take 3 mLs (2.5 mg total) by nebulization every 4 (four) hours as needed for wheezing or shortness of breath. Alford Highland, MD  Active Self  albuterol (VENTOLIN HFA) 108 (90 Base) MCG/ACT inhaler [id number]  Inhale 2 puffs into the lungs every 6 (six) hours as needed for wheezing or shortness of breath. Alford Highland, MD  Active Self  allopurinol (ZYLOPRIM) 100 MG tablet [id number]  TAKE 2 TABLETS (200 MG TOTAL) BY MOUTH DAILY. [name], MD  Active Self  apixaban (ELIQUIS) 5 MG TABS tablet [id number]  Take 1 tablet (5 mg total) by mouth 2 (two) times daily. [name], MD  Active            Med Note (ROBB, MELANIE  A   Mon Sep 06, 2022 10:58 AM)    atorvastatin (LIPITOR) 40 MG tablet [id number]  Take 1 tablet (40 mg total) by mouth daily. Delma Freeze, FNP  Active Self  Blood Glucose Monitoring Suppl (ACCU-CHEK GUIDE) w/Device Andria Rhein [id number]  Use as  directed [name], MD  Active Self  budesonide-formoterol [facility]) 160-4.5 MCG/ACT inhaler [id number]  Inhale 2 puffs into the lungs 2 (two) times daily. Alford Highland, MD  Active Self  carvedilol (COREG) 6.25 MG tablet [id number]  Take 1.5 tablets (9.375 mg total) by mouth 2 (two) times daily. Laurey Morale, MD  Active   colchicine 0.6 MG tablet [id number]  Take 0.5 tablets (0.3 mg total) by mouth three times a week. [name], MD  Active   dapagliflozin propanediol (FARXIGA) 10 MG TABS tablet [id number]  Take 1 tablet (10 mg total) by mouth daily. Clarisa Kindred A, FNP  Active Self  DULoxetine (CYMBALTA) 30 MG capsule [id number]  Take 1 capsule (30 mg total) by mouth daily. Park Pope, MD  Active   glucose blood (ACCU-CHEK GUIDE) test strip [id number]  Use as directed to check blood sugar 1-2 times a day [name], MD  Active Self  isosorbide-hydrALAZINE (BIDIL) 20-37.5 MG tablet [id number]  Take 1 tablet by mouth 3 (three) times daily. [name], MD  Active     Discontinued 07/11/20 [phone] (Discontinued by provider)   montelukast (SINGULAIR) 10 MG tablet [id number]  TAKE 1 TABLET (10 MG TOTAL) BY MOUTH AT BEDTIME. Alford Highland, MD  Active Self  oxyCODONE-acetaminophen (PERCOCET) 5-325 MG tablet [id number]  Take 1 tablet by mouth every 4 (four) hours as needed for severe pain.  Patient not taking: Reported on 09/28/2022   Minna Antis, MD  Active   potassium chloride (KLOR-CON M) 10 MEQ tablet [id number]  Take 2 tablets (20 mEq total) by mouth daily. Laurey Morale, MD  Active   Tiotropium Bromide Monohydrate (SPIRIVA RESPIMAT) 2.5 MCG/ACT AERS [id number]  Inhale 2 puffs into the lungs daily. Alford Highland, MD  Active Self  torsemide (DEMADEX) 20 MG tablet [id number]  Take 40 mg by mouth daily. [provider]  Active   traZODone (DESYREL) 50 MG tablet [id number]  Take 1-2 tablets (50-100 mg total) by mouth at bedtime. Park Pope, MD  Active              Patient Active Problem List   Diagnosis Date Noted   Post traumatic stress disorder 09/23/2022   MDD (major depressive disorder), recurrent episode, moderate (HCC) 08/26/2022   Stage 3b chronic kidney disease (HCC) 08/09/2022   Hypertrophic cardiomyopathy (HCC) 08/09/2022   History of renal cell cancer 08/09/2022   Chronic heart failure with preserved ejection fraction (HFpEF) (HCC) 07/13/2022   Agitation 04/23/2022   RSV (respiratory syncytial virus pneumonia) 04/22/2022   Obesity (BMI 30-39.9) 04/21/2022   COPD exacerbation (HCC) 04/19/2022   Dyslipidemia 04/19/2022   Gout 04/19/2022   Right kidney mass 05/14/2021   CHF exacerbation (HCC) 04/14/2021   Chest pain 04/14/2021   Syncope 04/14/2021   Left-sided weakness 10/28/2020   Typical atrial flutter (HCC)    CHF (congestive heart failure) (HCC) 07/04/2020   Acute exacerbation of CHF (congestive heart failure) (HCC) 06/16/2020   Influenza vaccine refused 05/06/2020   Acute decompensated heart failure (HCC) 05/04/2020   Illiteracy 05/04/2020   Type 2 diabetes mellitus with stage 3 chronic kidney disease (HCC) 12/25/2019   Elevated troponin I level 10/26/2019   History of gout  02/01/2019   Seasonal allergic rhinitis due to pollen 02/01/2019   Tobacco dependence 11/30/2018   Microscopic hematuria 11/30/2018   Depression 11/30/2018   Difficulty controlling anger 11/30/2018   COPD (chronic obstructive pulmonary disease) (HCC)    CKD (chronic kidney disease) stage 3, GFR 30-59 ml/min (HCC) 08/10/2018   Recurrent epistaxis 04/21/2018   Mixed hyperlipidemia 07/28/2017   Essential hypertension 07/28/2017   Chronic systolic heart failure (HCC) 10/25/2014   Cocaine abuse (HCC) 02/20/2013   Cannabis abuse 02/20/2013   Back pain, chronic 02/20/2013    Conditions to be addressed/monitored per PCP order:  Anxiety and Depression  Care Plan : General Social Work (Adult)  Updates made by Gustavus Bryant, LCSW since  10/25/2022 12:00 AM     Problem: Depression Identification (Depression)      Long-Range Goal: To increase my self care and gain mental health support   Start Date: 07/08/2022  Priority: High  Note:   Timeframe:  Long-Range Goal Priority:  High Start Date:   07/08/22                    Expected End Date:ongoing    Follow Up Date- 11/10/22 at 9 am   - begin personal counseling - call and visit an old friend - check out volunteer opportunities - join a support group - laugh; watch a funny movie or comedian - learn and use visualization or guided imagery - perform a random act of kindness - practice relaxation or meditation daily - start or continue a personal journal - talk about feelings with a friend, family or spiritual advisor - practice positive thinking and self-talk    Why is this important?   When you are stressed, down or upset, your body reacts too.  For example, your blood pressure may get higher; you may have a headache or stomachache.  When your emotions get the best of you, your body's ability to fight off cold and flu gets weak.  These steps will help you manage your emotions.   Current Barriers:  Chronic Mental Health needs related to depression and need for housing Limited social support, Housing barriers, and Mental Health Concerns  Social Isolation ADL IADL limitations Suicidal Ideation/Homicidal Ideation: No  Clinical Social Work Goal(s):  Over the next 120 days, patient will work with LCSW monthly by telephone or in person to reduce or manage symptoms related to depression and relapse prevention patient will work with [name] to address needs related to financial, food and transportation support. Patient wishes to offset medical and housing expenses with financial support as housing resources are very limited at this time  Interventions: Patient interviewed and appropriate assessments performed: brief mental health assessment Discussed plans with patient for  ongoing care management follow up and provided patient with direct contact information for care management team Assisted patient/caregiver with obtaining information about health plan benefits Patient was educated on the Advance Directives process. Patient was encouraged to complete document with PCP.  Advance Directive education was provided to patient again on 07/08/22 and 08/19/22 Encouraged patient to consider a mental health provider for long term follow up and therapy/counseling and patient is now agreeable. Referral made to [facility] for both counseling and psychiatry.  Patient reports recent agitation due to his inability to meet his daily mental and physical needs. [facility] LCSW provided emotional support and reflective listening.  Solution-Focused Strategies, Mindfulness or Relaxation Training, Active listening / Reflection utilized , Emotional Supportive Provided, and Verbalization of feelings encouraged  Patient declines any current substance use. Patient has a history of cocaine abuse. Relapse prevention education provided.  Patient reports that he has developed social anxiety. He admits that he gets irritable at times and will snap at his loved ones and then will experience guilt afterwards. Educated patient on coping methods to implement into his daily life to combat anxiety symptoms and stress. Patient denied any current suicidal or homicidal ideations. Encouraged patient to implement deep breathing and grounding exercises into his daily routine due to ongoing anxiety and SOB.  Patient reports that he will start implementing appropriate self-care habits into his daily routine such as: drinking water, staying active around the house, taking his medications as prescribed, combating negative thoughts or emotions and staying connected with his support network. Positive reinforcement provided.  [facility] LCSW made referral for [facility] [name] and [facility] RNCM involvement. Message sent to entire team by [facility] LCSW. 07/27/22 LCSW  update- Patient and [facility] LCSW contacted [facility] together and scheduled him for both psychiatry and counseling next month. Patient's counseling is virtual so he will not need transportation assistance for this appointment only for psychiatry. [facility] LCSW will update [facility] [name]. [facility] LCSW had patient physically write down his appointment reminders as he is having issues with his memory. Self-care education provided. [facility] LCSW 08/19/22 update- Patient and [facility] LCSW contacted [facility] together and left a message regarding his upcoming appointments as he has scheduled transportation for BOTH appointments even though of one those appointments is virtual. However, he has not filled out their intake paperwork and will need a ride to do this in person. [facility] LCSW encouraged patient to keep trying to reach [facility] in order to gain appropriate instructions on what to expect during his first visit. 08/27/22 update- Patient was able to successfully attend his psychiatry appointment yesterday. He was reminded of his upcoming appointments at [facility] for next month. He reports being in a lot of pain which has triggered his stress. Emotional support and resource education provided. LCSW update- Patient successfully went to [facility] for therapy and has a scheduled psychiatry visit next week. Patient reports that self care and stress management tools are not effectively working for him due to the amount of pain he is in. Care coordination completed with [facility] RNCM today. Update- Patient has upcoming initial therapy appointment on 5/16 at 3 pm and was advised to get there early to do their enrollment paperwork. He has an overlapping appointment that same day at 4:15 in [place] and was advised to let his care providers know. Crisis support resources reviewed as well.  09/15/21: [name] completed telephone outreach with patient for utility assistance. Patient stated that he has a cut off notice for 09/25/21 and his bill is $700.00. [name] informed patient he can go to the  [facility] and can also [employer]. [name] provided patient with the telephone number for [employer]. Patient stated no other resources are needed at this time.   Patient Self Care Activities:  Ability for insight Agreement to start mental health treatment and to the self-care journey   Patient Coping Strengths:  Self Advocate Able to Communicate Effectively  Patient Self Care Deficits:  Lacks social connections  The following coping skill education was provided for stress relief and mental health management: "When your car dies or a deadline looms, how do you respond? Long-term, low-grade or acute stress takes a serious toll on your body and mind, so don't ignore feelings of constant tension. Stress is  a natural part of life. However, too much stress can harm our health, especially if it continues every day. This is chronic stress and can put you at risk for heart problems like heart disease and depression. Understand what's happening inside your body and learn simple coping skills to combat the negative impacts of everyday stressors.  Types of Stress There are two types of stress: Emotional - types of emotional stress are relationship problems, pressure at work, financial worries, experiencing discrimination or having a major life change. Physical - Examples of physical stress include being sick having pain, not sleeping well, recovery from an injury or having an alcohol and drug use disorder. Fight or Flight Sudden or ongoing stress activates your nervous system and floods your bloodstream with adrenaline and cortisol, two hormones that raise blood pressure, increase heart rate and spike blood sugar. These changes pitch your body into a fight or flight response. That enabled our ancestors to outrun saber-toothed tigers, and it's helpful today for situations like dodging a car accident. But most modern chronic stressors, such as finances or a  challenging relationship, keep your body in that heightened state, which hurts your health. Effects of Too Much Stress If constantly under stress, most of [place] will eventually start to function less well.  Multiple studies link chronic stress to a higher risk of heart disease, stroke, depression, weight gain, memory loss and even premature death, so it's important to recognize the warning signals. Talk to your doctor about ways to manage stress if you're experiencing any of these symptoms: Prolonged periods of poor sleep. Regular, severe headaches. Unexplained weight loss or gain. Feelings of isolation, withdrawal or worthlessness. Constant anger and irritability. Loss of interest in activities. Constant worrying or obsessive thinking. Excessive alcohol or drug use. Inability to concentrate.  10 Ways to Cope with Chronic Stress It's key to recognize stressful situations as they occur because it allows you to focus on managing how you react. We all need to know when to close our eyes and take a deep breath when we feel tension rising. Use these tips to prevent or reduce chronic stress. 1. Rebalance Work and Home All work and no play? If you're spending too much time at the office, intentionally put more dates in your calendar to enjoy time for fun, either alone or with others. 2. Get Regular Exercise Moving your body on a regular basis balances the nervous system and increases blood circulation, helping to flush out stress hormones. Even a daily 20-minute walk makes a difference. Any kind of exercise can lower stress and improve your mood ? just pick activities that you enjoy and make it a regular habit. 3. Eat Well and Limit Alcohol and Stimulants Alcohol, nicotine and caffeine may temporarily relieve stress but have negative health impacts and can make stress worse in the long run. Well-nourished bodies cope better, so start with a good breakfast, add more organic fruits and vegetables for a  well-balanced diet, avoid processed foods and sugar, try herbal tea and drink more water. 4. Connect with Supportive People Talking face to face with another person releases hormones that reduce stress. Lean on those good listeners in your life. 5. Carve Out Hobby Time Do you enjoy gardening, reading, listening to music or some other creative pursuit? Engage in activities that bring you pleasure and joy; research shows that reduces stress by almost half and lowers your heart rate, too. 6. Practice Meditation, Stress Reduction or Yoga Relaxation techniques activate a state of restfulness  that counterbalances your body's fight-or-flight hormones. Even if this also means a 10-minute break in a long day: listen to music, read, go for a walk in nature, do a hobby, take a bath or spend time with a friend. Also consider doing a mindfulness exercise or try a daily deep breathing or imagery practice. Deep Breathing Slow, calm and deep breathing can help you relax. Try these steps to focus on your breathing and repeat as needed. Find a comfortable position and close your eyes. Exhale and drop your shoulders. Breathe in through your nose; fill your lungs and then your belly. Think of relaxing your body, quieting your mind and becoming calm and peaceful. Breathe out slowly through your nose, relaxing your belly. Think of releasing tension, pain, worries or distress. Repeat steps three and four until you feel relaxed. Imagery This involves using your mind to excite the senses -- sound, vision, smell, taste and feeling. This may help ease your stress. Begin by getting comfortable and then do some slow breathing. Imagine a place you love being at. It could be somewhere from your childhood, somewhere you vacationed or just a place in your imagination. Feel how it is to be in the place you're imagining. Pay attention to the sounds, air, colors, and who is there with you. This is a place where you feel cared for and  loved. All is well. You are safe. Take in all the smells, sounds, tastes and feelings. As you do, feel your body being nourished and healed. Feel the calm that surrounds you. Breathe in all the good. Breathe out any discomfort or tension. 7. Sleep Enough If you get less than seven to eight hours of sleep, your body won't tolerate stress as well as it could. If stress keeps you up at night, address the cause, and add extra meditation into your day to make up for the lost z's. Try to get seven to nine hours of sleep each night. Make a regular bedtime schedule. Keep your room dark and cool. Try to avoid computers, TV, cell phones and tablets before bed. 8. Bond with Connections You Enjoy Go out for a coffee with a friend, chat with a neighbor, call a family member, visit with a clergy member, or even hang out with your pet. Clinical studies show that spending even a short time with a companion animal can cut anxiety levels almost in half. 9. Take a Vacation Getting away from it all can reset your stress tolerance by increasing your mental and emotional outlook, which makes you a happier, more productive person upon return. Leave your cellphone and laptop at home! 10. See a Counselor, Coach or Therapist If negative thoughts overwhelm your ability to make positive changes, it's time to seek professional help. Make an appointment today--your health and life are worth it."     24- Hour Availability:    [facility]  [address] [place], [place] [phone] Crisis [phone]   [facility] [phone]   [place] [facility]  [phone]    [facility]  [phone] (after hours)   Therapeutic Alternative/Mobile Crisis   [phone]   [place] National Suicide Hotline  432-626-0369 Len Childs) [place] [address]   Call 911 or go to emergency room   [facility]  [phone]);  [place] and [employer]  [phone]); [place], [place], [place], [place], [place], [place], [place]  07/08/2022   10:52 AM 05/11/2022    4:17 PM 12/21/2021   11:44 AM 08/20/2021    9:57 AM 05/14/2021   12:36 PM  Depression screen PHQ 2/9  Decreased Interest 2 1 2  0 0  Down, Depressed, Hopeless 2 1 1  0 1  PHQ - 2 Score 4 2 3  0 1  Altered sleeping 3 3 3  3   Tired, decreased energy 3 3 2  1   Change in appetite 2 2 0  0  Feeling bad or failure about yourself  2 2 0  0  Trouble concentrating 2 2 1   0  Moving slowly or fidgety/restless 0 0 1  0  Suicidal thoughts 0 0 0  0  PHQ-9 Score 16 14 10  5   Difficult doing work/chores Very difficult      Follow up goal      Follow up:  Patient agrees to Care Plan and Follow-up.  Plan: The Managed Medicaid care management team will reach out to the patient again over the next 30 days.     24- Hour Availability:    [facility]  [address] [place], [place] [phone] Crisis [phone]   [facility] [phone]  [place] [facility]  [phone]    [facility]  [phone] (after hours)   Therapeutic Alternative/Mobile Crisis   [phone]   [place] National Suicide Hotline  6577346679 [place] Child[place]) [place] [address]   Call [phone] for mental health emergencies   [facility]  [phone]);  [place] and [employer]  [phone]); [place], [place], [place], [place], [place], [place], [place]    [facility] Residents For 24/7 walk-up access to mental health services for [facility] children (4+), adolescents and adults, please visit the [facility] located at [address] in [place], [place].  *[facility] also provides comprehensive outpatient behavioral health services in a variety of locations around the [place].  Connect With  [place] [address] [place], [place] [address] HelpLine: [phone] or [phone]  Get Directions  Find Help 24/7 By Phone Call our 24-hour HelpLine at [phone] or [phone] for immediate assistance for mental health and substance abuse issues.  Walk-In Help [place]: [facility] (Ages 4 and Up) [place]: Emergency Dept., [facility] Additional Resources National [employer]: 1-800-SUICIDE The National Suicide Prevention Lifeline: 1-800-273-TALK     Dickie La, [name], MSW, LCSW Managed Medicaid LCSW [facility]  [facility] [email or web address] Phone: [phone]

## 2022-10-27 ENCOUNTER — Ambulatory Visit
Admission: RE | Admit: 2022-10-27 | Discharge: 2022-10-27 | Disposition: A | Discharge status: Home / Self Care | Payer: Medicaid Other | Source: Ambulatory Visit | Attending: Interventional Radiology | Admitting: Interventional Radiology

## 2022-10-27 ENCOUNTER — Other Ambulatory Visit: Payer: Medicaid Other

## 2022-10-27 ENCOUNTER — Telehealth: Payer: Self-pay | Admitting: Internal Medicine

## 2022-10-27 DIAGNOSIS — N2889 Other specified disorders of kidney and ureter: Secondary | ICD-10-CM | POA: Diagnosis not present

## 2022-10-27 DIAGNOSIS — C641 Malignant neoplasm of right kidney, except renal pelvis: Secondary | ICD-10-CM

## 2022-10-27 HISTORY — PX: IR RADIOLOGIST EVAL & MGMT: IMG5224

## 2022-10-27 NOTE — Patient Instructions (Signed)
Visit Information  Chris [name] was given information about Medicaid Managed Care team care coordination services as a part of their Healthy [facility] Medicaid benefit. Chris [name] verbally consented to engagement with the [facility] Managed Care team.   If you are experiencing a medical emergency, please call 911 or report to your local emergency department or urgent care.   If you have a non-emergency medical problem during routine business hours, please contact your provider's office and ask to speak with a nurse.   For questions related to your Healthy [facility] health plan, please call: [phone] or visit the homepage here: [email or web address]  If you would like to schedule transportation through your Healthy [facility] plan, please call the following number at least 2 days in advance of your appointment: [phone]  For information about your ride after you set it up, call Ride Assist at [phone]. Use this number to activate a Will Call pickup, or if your transportation is late for a scheduled pickup. Use this number, too, if you need to make a change or cancel a previously scheduled reservation.  If you need transportation services right away, call [phone]. The after-hours call center is staffed 24 hours to handle ride assistance and urgent reservation requests (including discharges) 365 days a year. Urgent trips include sick visits, hospital discharge requests and life-sustaining treatment.  Call the [facility] Line at [phone], at any time, 24 hours a day, 7 days a week. If you are in danger or need immediate medical attention call 911.  If you would like help to quit smoking, call 1-800-QUIT-NOW (207-812-6584) OR Espaol: 1-855-Djelo-Ya (8-756-433-2951) o para ms informacin haga clic aqu or Text READY to 884-166 to register via text  Chris [name] - following are the goals we discussed in your visit today:    Goals Addressed   None      Social Worker will follow up in 60 days.   [name], [name], MHA [facility]  Managed Medicaid Social Worker [phone]   Following is a copy of your plan of care:  There are no care plans that you recently modified to display for this patient.

## 2022-10-27 NOTE — Telephone Encounter (Signed)
Received message from IR Dr. Elby Showers regarding pt. I contacted [facility] and spoke with Dr. Roda Shutters to get opinion on sizable calcifications that Dr. Elby Showers reports are seen in the left lumbar region on CT of the abdomen and pelvis done 06/29/2022 that may be causing pt's back pain. She stated that some superficial calcifications are noted in the posterior laterals.  Left hip area above the gluteal muscle.  She states that they are very superficial and the largest one measuring up to 1.5 cm in size.  However there is 1 oblong 1 that extends about 4 cm.  He stated that these can be sequelae of prior inflammation.  Can be seen in patients who previously receiving subcutaneous injections like insulin or Lovenox.  But most of the time.  Likely posttraumatic, usually inert and not of clinical significance. I will speak with the patient about it on his next visit.  I doubt that the calcifications are causing his chronic lower back pain.

## 2022-10-27 NOTE — Progress Notes (Signed)
Referring Physician(s): Irineo Axon, MD  Reason for follow up:  Virtual telephone clinic visit 4 months after right renal mass microwave ablation  History of present illness: Initial HPI from 05/13/22: Chris [name] is a 62 y.o. male with history of recently diagnosed right renal mass concerning for renal cell carcinoma.  He was admitted at [facility] for acute respiratory failure and due to CKD III received a renal ultrasound on 04/22/22 which incidentally noted a 2.9 cm solid mass.  This was further evaluated by MRI on 04/23/22 which demonstrated an enhancing mass concerning for renal cell carcinoma.   His respiratory symptoms have improved significantly since discharge home.  He denies any symptoms related to his mass including pain, discomfort, hematuria, or any history of urinary tract infections.  Chris [name] underwent CT guided right renal mass biopsy and microwave ablation on 06/23/22.  The procedure was uncomplicated, and he was discharged home the same day.  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   Initial follow up MRI was performed on 10/18/22.   He has recovered well from the procedure.  Denies right flank pain, hematuria, dysuria.  He does report overall feeling sluggish and tired.  Having significant pain on left side, which is chronic.  He describes this as just superior and lateral to the left buttock.  He states that when he was in prison during a weight lifting competition he did 70 dead lifts and felt something pop in that area, and it has bothered him since.  Interestingly, on a CT abdomen/pelvis from January of this year there are subcutaneous dystrophic appearing calcifications in this region.  Past Medical History:  Diagnosis Date   Arrhythmia    atrial flutter   CHF (congestive heart failure) (HCC)    Chronic kidney disease    COPD (chronic obstructive pulmonary disease) (HCC)    Coronary artery disease    Depression    Diabetes mellitus  without complication (HCC)    GERD (gastroesophageal reflux disease)    Gout    Hypertension    Influenza A with respiratory manifestations    Mental disorder     Past Surgical History:  Procedure Laterality Date   ANKLE SURGERY     CARDIAC CATHETERIZATION     CARDIOVERSION N/A 07/08/2020   Procedure: CARDIOVERSION;  Surgeon: Lamar Blinks, MD;  Location: [facility] ORS;  Service: Cardiovascular;  Laterality: N/A;   COLONOSCOPY WITH PROPOFOL N/A 11/19/2021   Procedure: COLONOSCOPY WITH PROPOFOL;  Surgeon: Jenel Lucks, MD;  Location: [facility];  Service: Gastroenterology;  Laterality: N/A;   HERNIA REPAIR     x2   IR RADIOLOGIST EVAL & MGMT  05/13/2022   POLYPECTOMY  11/19/2021   Procedure: POLYPECTOMY;  Surgeon: Jenel Lucks, MD;  Location: [facility];  Service: Gastroenterology;;   RADIOLOGY WITH ANESTHESIA N/A 06/23/2022   Procedure: Right renal tumor ablation;  Surgeon: Bennie Dallas, MD;  Location: [facility] OR;  Service: Radiology;  Laterality: N/A;   RIGHT HEART CATH N/A 07/13/2022   Procedure: RIGHT HEART CATH;  Surgeon: Laurey Morale, MD;  Location: [facility] INVASIVE CV LAB;  Service: Cardiovascular;  Laterality: N/A;   SHOULDER SURGERY     TEE WITHOUT CARDIOVERSION N/A 07/08/2020   Procedure: TRANSESOPHAGEAL ECHOCARDIOGRAM (TEE);  Surgeon: Lamar Blinks, MD;  Location: [facility] ORS;  Service: Cardiovascular;  Laterality: N/A;    Allergies: Patient has no known allergies.  Medications: Prior to Admission medications   Medication Sig Start Date  End Date Taking? Authorizing Provider  Accu-Chek Softclix Lancets lancets Use as instructed 04/28/22   [name], MD  acetaminophen (TYLENOL) 500 MG tablet Take 500-1,000 mg by mouth every 6 (six) hours as needed (pain.). Patient not taking: Reported on 10/20/2022    [provider]  albuterol (PROVENTIL) (2.5 MG/3ML) 0.083% nebulizer solution Take 3 mLs (2.5 mg total) by nebulization every 4 (four) hours as  needed for wheezing or shortness of breath. 10/20/22 11/19/22  [name], MD  albuterol (VENTOLIN HFA) 108 (90 Base) MCG/ACT inhaler Inhale 2 puffs into the lungs every 6 (six) hours as needed for wheezing or shortness of breath. 10/20/22   [name], MD  allopurinol (ZYLOPRIM) 100 MG tablet TAKE 2 TABLETS (200 MG TOTAL) BY MOUTH DAILY. 05/12/22   [name], MD  apixaban (ELIQUIS) 5 MG TABS tablet Take 1 tablet (5 mg total) by mouth 2 (two) times daily. 08/03/22   [name], MD  atorvastatin (LIPITOR) 40 MG tablet Take 1 tablet (40 mg total) by mouth daily. 06/10/22   Delma Freeze, FNP  Blood Glucose Monitoring Suppl (ACCU-CHEK GUIDE) w/Device KIT Use as directed 04/28/22   [name], MD  budesonide-formoterol [facility]) 160-4.5 MCG/ACT inhaler Inhale 2 puffs into the lungs 2 (two) times daily. 10/20/22   [name], MD  carvedilol (COREG) 6.25 MG tablet Take 1.5 tablets (9.375 mg total) by mouth 2 (two) times daily. 09/28/22   Laurey Morale, MD  colchicine 0.6 MG tablet Take 0.5 tablets (0.3 mg total) by mouth three times a week. Patient not taking: Reported on 10/20/2022 08/09/22   [name], MD  dapagliflozin propanediol (FARXIGA) 10 MG TABS tablet Take 1 tablet (10 mg total) by mouth daily. 06/10/22   Delma Freeze, FNP  DULoxetine (CYMBALTA) 30 MG capsule Take 1 capsule (30 mg total) by mouth daily. 09/30/22 11/29/22  Park Pope, MD  glucose blood (ACCU-CHEK GUIDE) test strip Use as directed to check blood sugar 1-2 times a day 04/28/22   [name], MD  isosorbide-hydrALAZINE (BIDIL) 20-37.5 MG tablet Take 1 tablet by mouth 3 (three) times daily. 08/03/22   [name], MD  montelukast (SINGULAIR) 10 MG tablet Take 1 tablet (10 mg total) by mouth at bedtime. 10/20/22   [name], MD  oxyCODONE-acetaminophen (PERCOCET) 5-325 MG tablet Take 1 tablet by mouth every 4 (four) hours as needed for severe pain. Patient not  taking: Reported on 09/28/2022 07/20/22   Minna Antis, MD  potassium chloride (KLOR-CON M) 10 MEQ tablet Take 2 tablets (20 mEq total) by mouth daily. 07/13/22   Laurey Morale, MD  Tiotropium Bromide Monohydrate (SPIRIVA RESPIMAT) 2.5 MCG/ACT AERS Inhale 2 puffs into the lungs daily. Patient not taking: Reported on 10/20/2022 04/26/22   Alford Highland, MD  torsemide (DEMADEX) 20 MG tablet Take 40 mg by mouth daily.    [provider]  traZODone (DESYREL) 50 MG tablet Take 1-2 tablets (50-100 mg total) by mouth at bedtime. Patient not taking: Reported on 10/20/2022 09/30/22   Park Pope, MD  metoprolol tartrate (LOPRESSOR) 100 MG tablet Take 1 tablet (100 mg total) by mouth 2 (two) times daily. 07/09/20 07/11/20  Marrion Coy, MD     Family History  Problem Relation Age of Onset   Heart disease Father    Diabetes Mother    HIV Brother    Healthy Son    Healthy Daughter     Social History  Socioeconomic History   Marital status: Divorced    Spouse name: Not on file   Number of children: 3   Years of education: Not on file   Highest education level: High school graduate  Occupational History   Occupation: disability  Tobacco Use   Smoking status: Every Day    Packs/day: 1.00    Years: 43.00    Additional pack years: 0.00    Total pack years: 43.00    Types: Cigarettes   Smokeless tobacco: Never   Tobacco comments:        4 cigs daily--05/12/2022  Vaping Use   Vaping Use: Never used  Substance and Sexual Activity   Alcohol use: Yes    Alcohol/week: 4.0 standard drinks of alcohol    Types: 4 Shots of liquor per week   Drug use: Yes    Frequency: 21.0 times per week    Types: Marijuana    Comment: last use Cocaine- 03/28/2021. Still using marijuana, last use 06/22/21   Sexual activity: Not on file  Other Topics Concern   Not on file  Social History Narrative   ** Merged History Encounter **       Social Determinants of Health   Financial Resource Strain:  Medium Risk (07/23/2022)   Overall Financial Resource Strain (CARDIA)    Difficulty of Paying Living Expenses: Somewhat hard  Food Insecurity: No Food Insecurity (04/20/2022)   Hunger Vital Sign    Worried About Running Out of Food in the Last Year: Never true    Ran Out of Food in the Last Year: Never true  Transportation Needs: Unmet Transportation Needs (10/20/2022)   PRAPARE - Administrator, Civil Service (Medical): Yes    Lack of Transportation (Non-Medical): Yes  Physical Activity: Insufficiently Active (11/16/2021)   Exercise Vital Sign    Days of Exercise per Week: 5 days    Minutes of Exercise per Session: 20 min  Stress: Stress Concern Present (10/25/2022)   [employer] of Occupational Health - Occupational Stress Questionnaire    Feeling of Stress : To some extent  Social Connections: Moderately Isolated (07/23/2022)   Social Connection and Isolation Panel [NHANES]    Frequency of Communication with Friends and Family: Twice a week    Frequency of Social Gatherings with Friends and Family: Once a week    Attends Religious Services: More than 4 times per year    Active Member of [employer] or Organizations: No    Attends Banker Meetings: Never    Marital Status: Divorced     Vital Signs: There were no vitals taken for this visit.  No physical examination was performed in lieu of virtual telephone clinic visit.   Imaging: MRI Abdomen 04/23/22   Right upper pole enhancing mass 2.8 x 2.5 x 2.3 cm  MRI abdomen 10/18/22  IMPRESSION: 1. Upper pole right renal ablation without residual, locally recurrent, or metastatic disease. 2. Hepatomegaly 3. Renal cysts and Bosniak 2 hemorrhagic/proteinaceous cysts. No specific follow-up indicated. 4. Aortic Atherosclerosis (ICD10-I70.0).   Labs:  CBC: Recent Labs    06/29/22 0948 07/09/22 1312 07/13/22 1105 07/20/22 0855 09/28/22 1216  WBC 10.1 10.2  --  12.6* 8.7  HGB 14.4 14.0 14.6  14.6 14.2  14.4  HCT 44.2 43.6 43.0  43.0 44.3 45.2  PLT 240 242  --  236 187    COAGS: Recent Labs    06/23/22 0946  INR 1.0    BMP: Recent Labs  06/29/22 0948 07/09/22 1312 07/13/22 1105 07/20/22 0824 09/28/22 1216  NA 139 138 141  141 137 140  K 3.6 4.1 4.3  4.4 4.0 4.2  CL 106 103  --  103 106  CO2 23 26  --  22 28  GLUCOSE 101* 95  --  96 99  BUN 23* 19  --  49* 24*  CALCIUM 8.9 9.1  --  8.5* 9.0  CREATININE 1.70* 1.86*  --  2.54* 1.75*  GFRNONAA 46* 41*  --  28* 44*    LIVER FUNCTION TESTS: Recent Labs    04/19/22 1605 06/29/22 0948 07/20/22 0824  BILITOT 0.7 0.3 0.5  AST 29 16 15   ALT 21 16 13   ALKPHOS 78 65 82  PROT 8.1 6.8 7.4  ALBUMIN 4.0 3.3* 3.7    Assessment and Plan: 62 year old male with history of 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  He has recovered well since the procedure.  Initial follow up imaging demonstrates successful ablation without residual enhancement.  -Plan for follow up MRI abdomen and clinic visit in 3 months. -I will contact Dr. Laural Benes regarding his CT finding in the left buttock (described above) given his chronic pain located at that region    Electronically Signed: Bennie Dallas 10/27/2022, 7:36 AM   I spent a total of 25 Minutes in virtual telephone clinical consultation, greater than 50% of which was counseling/coordinating care for renal cell carcinoma.

## 2022-10-27 NOTE — Patient Outreach (Signed)
Medicaid Managed Care Social Work Note  10/27/2022 Name:  Chris [name] MRN:  [id number] DOB:  09-20-1960  Chris [name] is an 62 y.o. year old male who is a primary patient of [name], MD.  The Medicaid Managed Care Coordination team was consulted for assistance with:  Community Resources   [name] was given information about Medicaid Managed Care Coordination team services today. [name] Patient agreed to services and verbal consent obtained.  Engaged with patient  for by telephone forfollow up visit in response to referral for case management and/or care coordination services.   Assessments/Interventions:  Review of past medical history, allergies, medications, health status, including review of consultants reports, laboratory and other test data, was performed as part of comprehensive evaluation and provision of chronic care management services.  SDOH: (Social Determinant of Health) assessments and interventions performed: SDOH Interventions    Flowsheet Row Patient Outreach Telephone from 10/25/2022 in [place] POPULATION HEALTH DEPARTMENT Patient Outreach Telephone from 10/20/2022 in [place] POPULATION HEALTH DEPARTMENT Patient Outreach Telephone from 08/27/2022 in [place] POPULATION HEALTH DEPARTMENT Patient Outreach Telephone from 08/19/2022 in [place] POPULATION HEALTH DEPARTMENT Patient Outreach Telephone from 07/27/2022 in [place] POPULATION HEALTH DEPARTMENT Patient Outreach Telephone from 07/23/2022 in [place] POPULATION HEALTH DEPARTMENT  SDOH Interventions        Housing Interventions -- -- -- -- -- Intervention Not Indicated  Transportation Interventions -- Payor Benefit -- -- -- Other (Comment)  Cabin crew arranged by MM Care Guide for upcoming appointments]  Utilities Interventions -- -- -- -- -- Other (Comment)  [MM Social WOrk team consulted]  Corporate treasurer Interventions -- -- -- -- -- Other (Comment)  [MM  Social Work Support]  Stress Interventions Provide Counseling, Warehouse manager Wellness Resources -- [employer], Provide Counseling Offered [employer], Provide Counseling Provide Counseling, Offered [employer] Resources --  Social Connections Interventions -- -- -- -- -- Other (Comment)  [SW team referral]     [name] completed a telephone outreach with patient, he states everything is going well. He does enjoy when he is able to speak with someone about his past. He is looking forward to his therapy appointment on 10/28/22. No resources are needed at this time.  Advanced Directives Status:  Not addressed in this encounter.  Care Plan                 No Known Allergies  Medications Reviewed Today     Reviewed by [name], RN (Registered Nurse) on 10/20/22 at [phone]  Med List Status: <None>   Medication Order Taking? Sig Documenting Provider Last Dose Status Informant  Accu-Chek Softclix Lancets lancets [id number]  Use as instructed [name], MD  Active Self  acetaminophen (TYLENOL) 500 MG tablet [id number]  Take 500-1,000 mg by mouth every 6 (six) hours as needed (pain.). [provider]  Active Self  albuterol (PROVENTIL) (2.5 MG/3ML) 0.083% nebulizer solution [id number]  Take 3 mLs (2.5 mg total) by nebulization every 4 (four) hours as needed for wheezing or shortness of breath. Alford Highland, MD  Active Self  albuterol (VENTOLIN HFA) 108 (90 Base) MCG/ACT inhaler [id number]  Inhale 2 puffs into the lungs every 6 (six) hours as needed for wheezing or shortness of breath. Alford Highland, MD  Active Self  allopurinol (ZYLOPRIM) 100 MG tablet [id number]  TAKE 2 TABLETS (200 MG TOTAL) BY MOUTH DAILY. [name], MD  Active Self  apixaban (ELIQUIS) 5 MG TABS tablet [id number]  Take 1 tablet (5 mg total) by mouth 2 (two) times daily. [name], MD  Active            Med Note Ardelia Mems, MELANIE A   Mon Sep 06, 2022 10:58 AM)    atorvastatin (LIPITOR) 40 MG tablet [id number]  Take 1 tablet (40 mg total) by mouth daily. Delma Freeze, FNP  Active Self  Blood Glucose Monitoring Suppl (ACCU-CHEK GUIDE) w/Device Andria Rhein [id number]  Use as directed [name], MD  Active Self  budesonide-formoterol [facility]) 160-4.5 MCG/ACT inhaler [id number]  Inhale 2 puffs into the lungs 2 (two) times daily. Alford Highland, MD  Active Self  carvedilol (COREG) 6.25 MG tablet [id number]  Take 1.5 tablets (9.375 mg total) by mouth 2 (two) times daily. Laurey Morale, MD  Active   colchicine 0.6 MG tablet [id number]  Take 0.5 tablets (0.3 mg total) by mouth three times a week. [name], MD  Active   dapagliflozin propanediol (FARXIGA) 10 MG TABS tablet [id number]  Take 1 tablet (10 mg total) by mouth daily. Clarisa Kindred A, FNP  Active Self  DULoxetine (CYMBALTA) 30 MG capsule [id number]  Take 1 capsule (30 mg total) by mouth daily. Park Pope, MD  Active   glucose blood (ACCU-CHEK GUIDE) test strip [id number]  Use as directed to check blood sugar 1-2 times a day [name], MD  Active Self  isosorbide-hydrALAZINE (BIDIL) 20-37.5 MG tablet [id number]  Take 1 tablet by mouth 3 (three) times daily. [name], MD  Active     Discontinued 07/11/20 [phone] (Discontinued by provider)   montelukast (SINGULAIR) 10 MG tablet [id number]  TAKE 1 TABLET (10 MG TOTAL) BY MOUTH AT BEDTIME. Alford Highland, MD  Active Self  oxyCODONE-acetaminophen (PERCOCET) 5-325 MG tablet [id number]  Take 1 tablet by mouth every 4 (four) hours as needed for severe pain.  Patient not taking: Reported on 09/28/2022   Minna Antis, MD  Active   potassium chloride (KLOR-CON M) 10 MEQ tablet [id number]  Take 2 tablets (20 mEq total) by mouth daily. Laurey Morale, MD  Active   Tiotropium Bromide Monohydrate (SPIRIVA RESPIMAT) 2.5 MCG/ACT AERS [id number]  Inhale 2 puffs into the lungs daily. Alford Highland, MD  Active Self   torsemide (DEMADEX) 20 MG tablet [id number]  Take 40 mg by mouth daily. [provider]  Active   traZODone (DESYREL) 50 MG tablet [id number]  Take 1-2 tablets (50-100 mg total) by mouth at bedtime. Park Pope, MD  Active             Patient Active Problem List   Diagnosis Date Noted   Post traumatic stress disorder 09/23/2022   MDD (major depressive disorder), recurrent episode, moderate (HCC) 08/26/2022   Stage 3b chronic kidney disease (HCC) 08/09/2022   Hypertrophic cardiomyopathy (HCC) 08/09/2022   History of renal cell cancer 08/09/2022   Chronic heart failure with preserved ejection fraction (HFpEF) (HCC) 07/13/2022   Agitation 04/23/2022   RSV (respiratory syncytial virus pneumonia) 04/22/2022   Obesity (BMI 30-39.9) 04/21/2022   COPD exacerbation (HCC) 04/19/2022   Dyslipidemia 04/19/2022   Gout 04/19/2022   Right kidney mass 05/14/2021   CHF exacerbation (HCC) 04/14/2021   Chest pain 04/14/2021   Syncope 04/14/2021   Left-sided weakness 10/28/2020   Typical atrial flutter (HCC)    CHF (congestive heart failure) (HCC) 07/04/2020   Acute exacerbation of CHF (congestive heart failure) (HCC) 06/16/2020   Influenza vaccine  refused 05/06/2020   Acute decompensated heart failure (HCC) 05/04/2020   Illiteracy 05/04/2020   Type 2 diabetes mellitus with stage 3 chronic kidney disease (HCC) 12/25/2019   Elevated troponin I level 10/26/2019   History of gout 02/01/2019   Seasonal allergic rhinitis due to pollen 02/01/2019   Tobacco dependence 11/30/2018   Microscopic hematuria 11/30/2018   Depression 11/30/2018   Difficulty controlling anger 11/30/2018   COPD (chronic obstructive pulmonary disease) (HCC)    CKD (chronic kidney disease) stage 3, GFR 30-59 ml/min (HCC) 08/10/2018   Recurrent epistaxis 04/21/2018   Mixed hyperlipidemia 07/28/2017   Essential hypertension 07/28/2017   Chronic systolic heart failure (HCC) 10/25/2014   Cocaine abuse (HCC) 02/20/2013    Cannabis abuse 02/20/2013   Back pain, chronic 02/20/2013    Conditions to be addressed/monitored per PCP order:   community resources  There are no care plans that you recently modified to display for this patient.   Follow up:  Patient agrees to Care Plan and Follow-up.  Plan: The Managed Medicaid care management team will reach out to the patient again over the next 60 days.  Date/time of next scheduled Social Work care management/care coordination outreach:  11/27/22  [name], [name], [facility]  Managed [facility] Social Worker [phone]

## 2022-10-27 NOTE — Telephone Encounter (Signed)
-----   Message from Bennie Dallas, MD sent at 10/27/2022  8:28 AM EDT ----- Regarding: back pain Dr. Laural Benes,  I followed up with Chris [name] today regarding his right renal cell carcinoma.  Thankfully it looks like we got the entire mass, no evidence of recurrence.  He complained to me of some low back/buttock pain that's palpable on the left side.  I looked back at a CT he had on 06/29/22, and interestingly he has some sizeable subcutaneous calcifications here (Series 3, image 71 if you want to look at the scan).  I suspect these are sequela of prior trauma.  Given his significant symptoms, I think it's reasonable to consider a general surgery referral for evaluation, as they may be able to surgical excise this region.  Just wanted to let you know this as it sounds like something he has complained about for a long time without any real resolution or defined etiology.  Thanks,  [employer]

## 2022-10-28 ENCOUNTER — Other Ambulatory Visit: Payer: Self-pay

## 2022-10-28 ENCOUNTER — Ambulatory Visit (HOSPITAL_COMMUNITY): Payer: Medicaid Other | Admitting: Mental Health

## 2022-10-28 ENCOUNTER — Other Ambulatory Visit: Payer: Self-pay | Admitting: Internal Medicine

## 2022-10-28 ENCOUNTER — Ambulatory Visit: Payer: Medicaid Other | Admitting: Student in an Organized Health Care Education/Training Program

## 2022-10-28 DIAGNOSIS — J449 Chronic obstructive pulmonary disease, unspecified: Secondary | ICD-10-CM

## 2022-10-28 DIAGNOSIS — F331 Major depressive disorder, recurrent, moderate: Secondary | ICD-10-CM

## 2022-10-28 MED FILL — Tiotropium Bromide Monohydrate Inhal Aerosol 2.5 MCG/ACT: RESPIRATORY_TRACT | 30 days supply | Qty: 4 | Fill #0 | Status: CN

## 2022-10-28 NOTE — Telephone Encounter (Signed)
Requested medication (s) are due for refill today: yes  Requested medication (s) are on the active medication list: yes    Last refill: 04/26/22  4g 0 refills  Future visit scheduled no  Notes to clinic: Historical provider, please review. Thank you.  Requested Prescriptions  Pending Prescriptions Disp Refills   Tiotropium Bromide Monohydrate (SPIRIVA RESPIMAT) 2.5 MCG/ACT AERS 4 g 0    Sig: Inhale 2 puffs into the lungs daily.     Pulmonology:  Anticholinergic Agents Passed - 10/28/2022 12:12 PM      Passed - Valid encounter within last 12 months    Recent Outpatient Visits           2 months ago Type 2 diabetes mellitus with obesity [facility])   [place] [facility] [name], MD   5 months ago Hospital discharge follow-up   [facility] Jonah Blue B, MD   10 months ago Type 2 diabetes mellitus with obesity [facility])   [facility] [name], MD   1 year ago Type 2 diabetes mellitus with obesity [facility])   [place] [facility] [name], MD   1 year ago SOB (shortness of breath)    [place] [facility] [name], MD       Future Appointments             Today Raechel Chute, MD [facility] at [place]   In 2 weeks Agbor-Etang, Arlys John, MD [facility] at [place]   In 1 month [name], MD [facility]

## 2022-10-28 NOTE — Progress Notes (Signed)
Pt. Presented for OPT appointment. Mood noticeably low; dysphoric. Head held down, slow gait. Shares to not be doing well and states, " I am just trying to be focused. Therapist explored current concerns/stressors with pt resistant to share. Sightly tearful. Therapist offered to just sit with pt in silence however pt denied reporting "When I am like this I like to be alone. Can I go home?" Therapist assessed for safety concerns, SI and HI with pt denying. Therapist rescheduled appointment and encouraged to reach out to therapist sooner than appointment if needed.   [facility], [facility] 10/28/2022

## 2022-10-28 NOTE — Telephone Encounter (Signed)
Requested medication (s) are due for refill today: yes  Requested medication (s) are on the active medication list: yes  Last refill:  04/26/22  Future visit scheduled: yes  Notes to clinic:  Unable to refill per protocol, last refill by another provider.      Requested Prescriptions  Pending Prescriptions Disp Refills   SPIRIVA RESPIMAT 2.5 MCG/ACT AERS [Pharmacy Med Name: Tiotropium Bromide Monohydrate (SPIRIVA RESPIMAT) 2.5 MCG/ACT Aero Soln] 4 g 0    Sig: Inhale 2 puffs into the lungs daily.     Pulmonology:  Anticholinergic Agents Passed - 10/28/2022  2:12 PM      Passed - Valid encounter within last 12 months    Recent Outpatient Visits           2 months ago Type 2 diabetes mellitus with obesity [facility])   [place] [facility] [name], MD   5 months ago Hospital discharge follow-up   [facility] Jonah Blue B, MD   10 months ago Type 2 diabetes mellitus with obesity [facility])   [place] [facility] [name], MD   1 year ago Type 2 diabetes mellitus with obesity [facility])   [place] [facility] [name], MD   1 year ago SOB (shortness of breath)   [place] [facility] [name], MD       Future Appointments             Today Raechel Chute, MD [facility] Care at [place]   In 2 weeks Agbor-Etang, Arlys John, MD [facility] at [place]   In 1 month [name], MD [facility]

## 2022-10-29 ENCOUNTER — Other Ambulatory Visit: Payer: Self-pay

## 2022-11-02 ENCOUNTER — Ambulatory Visit: Payer: Medicaid Other | Attending: Cardiology

## 2022-11-02 DIAGNOSIS — I5020 Unspecified systolic (congestive) heart failure: Secondary | ICD-10-CM

## 2022-11-02 LAB — ECHOCARDIOGRAM COMPLETE
AR max vel: 4.24 cm2
AV Area VTI: 3.81 cm2
AV Area mean vel: 4.05 cm2
AV Mean grad: 2.5 mmHg
AV Peak grad: 5.1 mmHg
Ao pk vel: 1.13 m/s
Area-P 1/2: 4.21 cm2
Calc EF: 42.7 %
S' Lateral: 5.5 cm
Single Plane A2C EF: 41.5 %
Single Plane A4C EF: 43.3 %

## 2022-11-09 ENCOUNTER — Other Ambulatory Visit: Payer: Self-pay

## 2022-11-09 ENCOUNTER — Ambulatory Visit: Payer: Medicaid Other | Attending: Cardiology | Admitting: Cardiology

## 2022-11-09 ENCOUNTER — Other Ambulatory Visit: Payer: Self-pay | Admitting: *Deleted

## 2022-11-09 VITALS — BP 138/92 | HR 77 | Ht 72.0 in | Wt 262.0 lb

## 2022-11-09 DIAGNOSIS — I5020 Unspecified systolic (congestive) heart failure: Secondary | ICD-10-CM

## 2022-11-09 MED ORDER — CARVEDILOL 12.5 MG PO TABS
12.5000 mg | ORAL_TABLET | Freq: Two times a day (BID) | ORAL | 3 refills | Status: DC
  Filled 2022-11-09: qty 60, 30d supply, fill #0

## 2022-11-09 NOTE — Progress Notes (Signed)
PCP: [name], MD HF Cardiology: Dr. Shirlee Latch  62 y.o. 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  Patient was admitted in 1/22 with CHF and found to be in atrial flutter.  Echo showed EF 25% and he was cardioverted to NSR.  He is no longer anticoagulated. Cardiolite in 2/23 showed no ischemia, fixed inferior defect.  Echo in 8/23 showed EF 50% with severe asymmetric septal hypertrophy but no SAM or LVOT gradient.  Cardiac MRI in 12/23 was similar with LV EF 50%, severe asymmetric septal hypertrophy, normal RV, LGE in the basal septum. These studies were concerning for hypertrophic cardiomyopathy.  Patient is an active smoker and used to use cocaine.  He carries history of COPD.  Grandfather had sudden cardiac death at work ("fell out dead").   RHC in 2022/07/18 showed elevated filling pressures, CI 2.25, PVR 3.9 WU.  Torsemide was increased.    Patient was seen in the ER in 2/6 with abdominal pain, nausea/vomiting.  At that time, creatinine was up to 2.54.   Zio monitor done 2/24 for episodes of ?syncope showed no AF/AFL, 1.5 % PVCs, 6 short NSVT runs (longest was 7 beats).   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  PKP2 gene is associated with ARVC and Brugada, but the clinical significance of the mutation found in this patient is uncertain (not reported in literature).  Echo in 5/24 showed EF 45-50%, moderate asymmetric septal hypertrophy with no LVOT gradient of mitral valve SAM, RV normal, mild-moderate MR, IVC normal.   He returns today for followup of CHF, ?HCM.  Weight is stable.  He has the same dyspnea, notes shortness of breath with hills/inclines.  Does  ok on flat ground.  No chest pain.  No further syncope/presyncope.  1-2 pillows at night.  No palpitations. Still smoking 3-4 cigarettes/day.   Labs Jul 18, 2022): BNP 873, K 3.6, creatinine 1.7 Labs (2/24): K 4, creatinine 2.54 Labs (4/24): K 4.2, creatinine 1.75, BNP 280, hgb 14.4  PMH: 1. Renal cell carcinoma on right: s/p ablation 2022/07/18.  2. 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  EF low in the past, echo in 1/22 with EF 22%.   - Cardiolite (2/23): Fixed inferior defect, ?artifact.  - Echo (8/23): EF 50%, severe asymmetric septal hypertrophy, no SAM/MR, no LVOT gradient, RV normal.  - Cardiac MRI (12/23): LV EF 50%, severe asymmetric septal hypertrophy, normal RV, LGE in the basal septum.  Possible hypertrophic cardiomyopathy.  - RHC Jul 18, 2022): mean RA 15, PA 50/31 mean 42, mean PCWP 21, CI 2.25, PVR 3.9 WU, PAPi 1.3 - Zio monitor done 2/24 showed no AF/AFL, 1.5 % PVCs, 6 short NSVT runs (longest was 7 beats).  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  PKP2 gene is associated with ARVC and Brugada, but the clinical significance of the mutation found in this patient is uncertain (not reported in literature). - Echo (5/24): EF 45-50%, moderate asymmetric septal hypertrophy  with no LVOT gradient of mitral valve SAM, RV normal, mild-moderate MR, IVC normal. 3. Atrial flutter: DCCV 1/22.   4. Type 2 diabetes.  5. HTN 6. CKD stage 3 7. Gout 8. Depression 9. COPD: Active smoker.  10. Prior cocaine abuse.   Social History   Socioeconomic History   Marital status: Divorced    Spouse name: Not on file   Number of children: 3   Years of education: Not on file   Highest education level: High school graduate  Occupational History   Occupation: disability  Tobacco  Use   Smoking status: Every Day    Packs/day: 1.00    Years: 43.00    Additional pack years: 0.00    Total pack years: 43.00    Types: Cigarettes   Smokeless tobacco: Never   Tobacco comments:        4 cigs daily--05/12/2022  Vaping Use   Vaping Use: Never used  Substance and Sexual Activity   Alcohol use: Yes    Alcohol/week: 4.0 standard drinks of alcohol    Types: 4 Shots of liquor per week   Drug use: Yes    Frequency: 21.0 times per week    Types: Marijuana    Comment: last use Cocaine- 03/28/2021. Still using marijuana, last use 06/22/21   Sexual activity: Not on file  Other Topics Concern   Not on file  Social History Narrative   ** Merged History Encounter **       Social Determinants of Health   Financial Resource Strain: Medium Risk (07/23/2022)   Overall Financial Resource Strain (CARDIA)    Difficulty of Paying Living Expenses: Somewhat hard  Food Insecurity: No Food Insecurity (04/20/2022)   Hunger Vital Sign    Worried About Running Out of Food in the Last Year: Never true    Ran Out of Food in the Last Year: Never true  Transportation Needs: Unmet Transportation Needs (10/20/2022)   PRAPARE - Administrator, Civil Service (Medical): Yes    Lack of Transportation (Non-Medical): Yes  Physical Activity: Insufficiently Active (11/16/2021)   Exercise Vital Sign    Days of Exercise per Week: 5 days    Minutes of Exercise per Session: 20 min  Stress: Stress Concern Present (10/25/2022)   [employer] of Occupational Health - Occupational Stress Questionnaire    Feeling of Stress : To some extent  Social Connections: Moderately Isolated (07/23/2022)   Social Connection and Isolation Panel [NHANES]    Frequency of Communication with Friends and Family: Twice a week    Frequency of Social Gatherings with Friends and Family: Once a week    Attends Religious Services: More than 4 times per year    Active Member of [employer] or Organizations: No    Attends Occupational hygienist Meetings: Never    Marital Status: Divorced  Catering manager Violence: Not At Risk (04/20/2022)   Humiliation, Afraid, Rape, and Kick questionnaire    Fear of Current or Ex-Partner: No    Emotionally Abused: No    Physically Abused: No    Sexually Abused: No   Family History  Problem Relation Age of Onset   Heart disease Father    Diabetes Mother    HIV Brother    Healthy Son    Healthy Daughter    ROS: All systems reviewed and negative except as per HPI.   Current Outpatient Medications  Medication Sig Dispense Refill   acetaminophen (TYLENOL) 500 MG tablet Take 500-1,000  mg by mouth every 6 (six) hours as needed (pain.).     albuterol (PROVENTIL) (2.5 MG/3ML) 0.083% nebulizer solution Take 3 mLs (2.5 mg total) by nebulization every 4 (four) hours as needed for wheezing or shortness of breath. 300 mL 0   albuterol (VENTOLIN HFA) 108 (90 Base) MCG/ACT inhaler Inhale 2 puffs into the lungs every 6 (six) hours as needed for wheezing or shortness of breath. 54 g 0   allopurinol (ZYLOPRIM) 100 MG tablet TAKE 2 TABLETS (200 MG TOTAL) BY MOUTH DAILY. 180 tablet 0   apixaban (ELIQUIS) 5 MG TABS tablet Take 1 tablet (5 mg total) by mouth 2 (two) times daily. 60 tablet 2   atorvastatin (LIPITOR) 40 MG tablet Take 1 tablet (40 mg total) by mouth daily. 90 tablet 3   Blood Glucose Monitoring Suppl (ACCU-CHEK GUIDE) w/Device KIT Use as directed 1 kit 0   budesonide-formoterol (SYMBICORT) 160-4.5 MCG/ACT inhaler Inhale 2 puffs into the lungs 2 (two) times daily. 30.6 g 0   dapagliflozin propanediol (FARXIGA) 10 MG TABS tablet Take 1 tablet (10 mg total) by mouth daily. 90 tablet 3   DULoxetine (CYMBALTA) 30 MG capsule Take 1 capsule (30 mg total) by mouth daily. 30 capsule 1   isosorbide-hydrALAZINE (BIDIL) 20-37.5 MG tablet Take 1 tablet by mouth 3 (three) times daily. 270 tablet 3   montelukast (SINGULAIR) 10 MG tablet Take 1 tablet (10 mg total) by mouth at bedtime. 90 tablet 0    potassium chloride (KLOR-CON M) 10 MEQ tablet Take 2 tablets (20 mEq total) by mouth daily. 180 tablet 3   Tiotropium Bromide Monohydrate (SPIRIVA RESPIMAT) 2.5 MCG/ACT AERS Inhale 2 puffs into the lungs daily. 4 g 0   torsemide (DEMADEX) 20 MG tablet Take 40 mg by mouth daily.     traZODone (DESYREL) 50 MG tablet Take 1-2 tablets (50-100 mg total) by mouth at bedtime. 60 tablet 1   Accu-Chek Softclix Lancets lancets Use as instructed 100 each 12   carvedilol (COREG) 12.5 MG tablet Take 1 tablet (12.5 mg total) by mouth 2 (two) times daily. 60 tablet 3   colchicine 0.6 MG tablet Take 0.5 tablets (0.3 mg total) by mouth three times a week. (Patient not taking: Reported on 11/09/2022) 15 tablet 1   glucose blood (ACCU-CHEK GUIDE) test strip Use as directed to check blood sugar 1-2 times a day 100 each 12   oxyCODONE-acetaminophen (PERCOCET) 5-325 MG tablet Take 1 tablet by mouth every 4 (four) hours as needed for severe pain. (Patient not taking: Reported on 09/28/2022) 12 tablet 0   No current facility-administered medications for this visit.   BP (!) 138/92   Pulse 77   Ht 6' (1.829 m)   Wt 262 lb (118.8 kg)   SpO2 97%   BMI 35.53 kg/m  General: NAD Neck: No JVD, no thyromegaly or thyroid nodule.  Lungs: Clear to auscultation bilaterally with normal respiratory effort. CV: Nondisplaced PMI.  Heart regular S1/S2, no S3/S4, no murmur.  No peripheral edema.  No carotid bruit.  Normal pedal pulses.  Abdomen: Soft, nontender, no hepatosplenomegaly, no distention.  Skin: Intact without lesions or rashes.  Neurologic: Alert and oriented x 3.  Psych: Normal affect. Extremities: No clubbing or cyanosis.  HEENT: Normal.   1. Chronic diastolic CHF/?hypertrophic cardiomyopathy: Prior history of HFrEF that may have been tachycardia-mediated in setting of atrial flutter in 1/22 vs cocaine-related, now EF has recovered to 50% on last study. Cardiolite in 2/23 showed no ischemia, fixed  inferior  defect.  Echo in 8/23 showed EF 50% with severe asymmetric septal hypertrophy but no SAM or LVOT gradient.  Cardiac MRI in 12/23 was similar with LV EF 50%, severe asymmetric septal hypertrophy, normal RV, LGE in the basal septum. These studies were concerning for hypertrophic cardiomyopathy.  RHC in 1/24 showed elevated filling pressures with CI 2.25.  His grandfather may have had sudden cardiac death at work, father died at 91 from "heart disease."  He has had syncopal episodes that may have been orthostatic, but I worried about ventricular arrhythmias given possible HCM and LGE noted on cardiac MRI. Zio monitor in 2/24 showed 1.5% PVCs and 6 short NSVT runs (longest 7 beats).  Nothing to explain syncope and has had no further episodes. 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  PKP2 gene is associated with ARVC and Brugada, but the clinical significance of the mutation found in this patient is uncertain (not reported in literature).  Echo in 5/24 showed EF 45-50%, moderate asymmetric septal hypertrophy with no LVOT gradient of mitral valve SAM, RV normal, mild-moderate MR, IVC normal. Stable NYHA class II-III, not volume overloaded on exam. 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  - Still need to get him appointment with [facility] for genetic counseling regarding the mutations of uncertain significance.  - With no LVOT obstruction or significant murmur, ok to continue Bidil.  - Increase Coreg to 12.5 mg bid.  - I will continue torsemide 40 mg daily, BMET/BNP today..  - Continue dapagliflozin 10 mg daily.  - I think he is going to need an ICD with suspected HCM and some higher risk markers, though somewhat borderline. I would like him to see [facility] Ace first.  Suspect HCM,  has some LGE on MRI but < 15% myocardium.  He has had some short NSVT runs on 1 week monitor (longest 7 beats).  No LVOT obstruction.  EF is mildly decreased at 45-50%.  He has had syncope but suspect not arrhythmic (had 1 syncopal episode while wearing monitor and no events).  Grandfather likely had SCD.  Will need referral to EP for ICD evaluation.  - Before ICD placement, I think that we should rule out CAD causing mild cardiomyopathy.  Creatinine has been improved recently. We discussed risks/benefits of right/left heart cath to assess for coronary disease and reassess filling pressures.  He agrees to procedure. He will hold apixaban the day before and day of.  2. CKD stage 3: BMET today.   3. COPD: Patient is active smoker and carries this history.  I strongly encouraged him to quit smoking today.  He is smoking much less than before.   4. Atrial flutter: S/p DCCV in 1/22.   - Continue apixaban.   Followup in 6 wks.   Marca Ancona 11/09/2022

## 2022-11-09 NOTE — H&P (View-Only) (Signed)
PCP: Urista, Deborah B, MD HF Cardiology: Dr. Jaely Silman  62 y.o. with history of HFpEF, CKD stage 3, possible hypertrophic cardiomyopathy, renal cell CA s/p ablation, and atrial flutter s/p 1/22 DCCV who was referred to CHF MD clinic by Tina Hackney.  Patient was admitted in 1/22 with CHF and found to be in atrial flutter.  Echo showed EF 25% and he was cardioverted to NSR.  He is no longer anticoagulated. Cardiolite in 2/23 showed no ischemia, fixed inferior defect.  Echo in 8/23 showed EF 50% with severe asymmetric septal hypertrophy but no SAM or LVOT gradient.  Cardiac MRI in 12/23 was similar with LV EF 50%, severe asymmetric septal hypertrophy, normal RV, LGE in the basal septum. These studies were concerning for hypertrophic cardiomyopathy.  Patient is an active smoker and used to use cocaine.  He carries history of COPD.  Grandfather had sudden cardiac death at work ("fell out dead").   RHC in 1/24 showed elevated filling pressures, CI 2.25, PVR 3.9 WU.  Torsemide was increased.    Patient was seen in the ER in 2/6 with abdominal pain, nausea/vomiting.  At that time, creatinine was up to 2.54.   Zio monitor done 2/24 for episodes of ?syncope showed no AF/AFL, 1.5 % PVCs, 6 short NSVT runs (longest was 7 beats).   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  PKP2 gene is associated with ARVC and Brugada, but the clinical significance of the mutation found in this patient is uncertain (not reported in literature).  Echo in 5/24 showed EF 45-50%, moderate asymmetric septal hypertrophy with no LVOT gradient of mitral valve SAM, RV normal, mild-moderate MR, IVC normal.   He returns today for followup of CHF, ?HCM.  Weight is stable.  He has the same dyspnea, notes shortness of breath with hills/inclines.  Does  ok on flat ground.  No chest pain.  No further syncope/presyncope.  1-2 pillows at night.  No palpitations. Still smoking 3-4 cigarettes/day.   Labs (1/24): BNP 873, K 3.6, creatinine 1.7 Labs (2/24): K 4, creatinine 2.54 Labs (4/24): K 4.2, creatinine 1.75, BNP 280, hgb 14.4  PMH: 1. Renal cell carcinoma on right: s/p ablation 1/24.  2. 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  EF low in the past, echo in 1/22 with EF 22%.   - Cardiolite (2/23): Fixed inferior defect, ?artifact.  - Echo (8/23): EF 50%, severe asymmetric septal hypertrophy, no SAM/MR, no LVOT gradient, RV normal.  - Cardiac MRI (12/23): LV EF 50%, severe asymmetric septal hypertrophy, normal RV, LGE in the basal septum.  Possible hypertrophic cardiomyopathy.  - RHC (1/24): mean RA 15, PA 50/31 mean 42, mean PCWP 21, CI 2.25, PVR 3.9 WU, PAPi 1.3 - Zio monitor done 2/24 showed no AF/AFL, 1.5 % PVCs, 6 short NSVT runs (longest was 7 beats).  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  PKP2 gene is associated with ARVC and Brugada, but the clinical significance of the mutation found in this patient is uncertain (not reported in literature). - Echo (5/24): EF 45-50%, moderate asymmetric septal hypertrophy   with no LVOT gradient of mitral valve SAM, RV normal, mild-moderate MR, IVC normal. 3. Atrial flutter: DCCV 1/22.   4. Type 2 diabetes.  5. HTN 6. CKD stage 3 7. Gout 8. Depression 9. COPD: Active smoker.  10. Prior cocaine abuse.   Social History   Socioeconomic History   Marital status: Divorced    Spouse name: Not on file   Number of children: 3   Years of education: Not on file   Highest education level: High school graduate  Occupational History   Occupation: disability  Tobacco  Use   Smoking status: Every Day    Packs/day: 1.00    Years: 43.00    Additional pack years: 0.00    Total pack years: 43.00    Types: Cigarettes   Smokeless tobacco: Never   Tobacco comments:        4 cigs daily--05/12/2022  Vaping Use   Vaping Use: Never used  Substance and Sexual Activity   Alcohol use: Yes    Alcohol/week: 4.0 standard drinks of alcohol    Types: 4 Shots of liquor per week   Drug use: Yes    Frequency: 21.0 times per week    Types: Marijuana    Comment: last use Cocaine- 03/28/2021. Still using marijuana, last use 06/22/21   Sexual activity: Not on file  Other Topics Concern   Not on file  Social History Narrative   ** Merged History Encounter **       Social Determinants of Health   Financial Resource Strain: Medium Risk (07/23/2022)   Overall Financial Resource Strain (CARDIA)    Difficulty of Paying Living Expenses: Somewhat hard  Food Insecurity: No Food Insecurity (04/20/2022)   Hunger Vital Sign    Worried About Running Out of Food in the Last Year: Never true    Ran Out of Food in the Last Year: Never true  Transportation Needs: Unmet Transportation Needs (10/20/2022)   PRAPARE - Transportation    Lack of Transportation (Medical): Yes    Lack of Transportation (Non-Medical): Yes  Physical Activity: Insufficiently Active (11/16/2021)   Exercise Vital Sign    Days of Exercise per Week: 5 days    Minutes of Exercise per Session: 20 min  Stress: Stress Concern Present (10/25/2022)   [employer] - Occupational Stress Questionnaire    Feeling of Stress : To some extent  Social Connections: Moderately Isolated (07/23/2022)   Social Connection and Isolation Panel [NHANES]    Frequency of Communication with Friends and Family: Twice a week    Frequency of Social Gatherings with Friends and Family: Once a week    Attends Religious Services: More than 4 times per year    Active Member of Clubs or Organizations: No    Attends Club  or Organization Meetings: Never    Marital Status: Divorced  Intimate Partner Violence: Not At Risk (04/20/2022)   Humiliation, Afraid, Rape, and Kick questionnaire    Fear of Current or Ex-Partner: No    Emotionally Abused: No    Physically Abused: No    Sexually Abused: No   Family History  Problem Relation Age of Onset   Heart disease Father    Diabetes Mother    HIV Brother    Healthy Son    Healthy Daughter    ROS: All systems reviewed and negative except as per HPI.   Current Outpatient Medications  Medication Sig Dispense Refill   acetaminophen (TYLENOL) 500 MG tablet Take 500-1,000   mg by mouth every 6 (six) hours as needed (pain.).     albuterol (PROVENTIL) (2.5 MG/3ML) 0.083% nebulizer solution Take 3 mLs (2.5 mg total) by nebulization every 4 (four) hours as needed for wheezing or shortness of breath. 300 mL 0   albuterol (VENTOLIN HFA) 108 (90 Base) MCG/ACT inhaler Inhale 2 puffs into the lungs every 6 (six) hours as needed for wheezing or shortness of breath. 54 g 0   allopurinol (ZYLOPRIM) 100 MG tablet TAKE 2 TABLETS (200 MG TOTAL) BY MOUTH DAILY. 180 tablet 0   apixaban (ELIQUIS) 5 MG TABS tablet Take 1 tablet (5 mg total) by mouth 2 (two) times daily. 60 tablet 2   atorvastatin (LIPITOR) 40 MG tablet Take 1 tablet (40 mg total) by mouth daily. 90 tablet 3   Blood Glucose Monitoring Suppl (ACCU-CHEK GUIDE) w/Device KIT Use as directed 1 kit 0   budesonide-formoterol (SYMBICORT) 160-4.5 MCG/ACT inhaler Inhale 2 puffs into the lungs 2 (two) times daily. 30.6 g 0   dapagliflozin propanediol (FARXIGA) 10 MG TABS tablet Take 1 tablet (10 mg total) by mouth daily. 90 tablet 3   DULoxetine (CYMBALTA) 30 MG capsule Take 1 capsule (30 mg total) by mouth daily. 30 capsule 1   isosorbide-hydrALAZINE (BIDIL) 20-37.5 MG tablet Take 1 tablet by mouth 3 (three) times daily. 270 tablet 3   montelukast (SINGULAIR) 10 MG tablet Take 1 tablet (10 mg total) by mouth at bedtime. 90 tablet 0    potassium chloride (KLOR-CON M) 10 MEQ tablet Take 2 tablets (20 mEq total) by mouth daily. 180 tablet 3   Tiotropium Bromide Monohydrate (SPIRIVA RESPIMAT) 2.5 MCG/ACT AERS Inhale 2 puffs into the lungs daily. 4 g 0   torsemide (DEMADEX) 20 MG tablet Take 40 mg by mouth daily.     traZODone (DESYREL) 50 MG tablet Take 1-2 tablets (50-100 mg total) by mouth at bedtime. 60 tablet 1   Accu-Chek Softclix Lancets lancets Use as instructed 100 each 12   carvedilol (COREG) 12.5 MG tablet Take 1 tablet (12.5 mg total) by mouth 2 (two) times daily. 60 tablet 3   colchicine 0.6 MG tablet Take 0.5 tablets (0.3 mg total) by mouth three times a week. (Patient not taking: Reported on 11/09/2022) 15 tablet 1   glucose blood (ACCU-CHEK GUIDE) test strip Use as directed to check blood sugar 1-2 times a day 100 each 12   oxyCODONE-acetaminophen (PERCOCET) 5-325 MG tablet Take 1 tablet by mouth every 4 (four) hours as needed for severe pain. (Patient not taking: Reported on 09/28/2022) 12 tablet 0   No current facility-administered medications for this visit.   BP (!) 138/92   Pulse 77   Ht 6' (1.829 m)   Wt 262 lb (118.8 kg)   SpO2 97%   BMI 35.53 kg/m  General: NAD Neck: No JVD, no thyromegaly or thyroid nodule.  Lungs: Clear to auscultation bilaterally with normal respiratory effort. CV: Nondisplaced PMI.  Heart regular S1/S2, no S3/S4, no murmur.  No peripheral edema.  No carotid bruit.  Normal pedal pulses.  Abdomen: Soft, nontender, no hepatosplenomegaly, no distention.  Skin: Intact without lesions or rashes.  Neurologic: Alert and oriented x 3.  Psych: Normal affect. Extremities: No clubbing or cyanosis.  HEENT: Normal.   1. Chronic diastolic CHF/?hypertrophic cardiomyopathy: Prior history of HFrEF that may have been tachycardia-mediated in setting of atrial flutter in 1/22 vs cocaine-related, now EF has recovered to 50% on last study. Cardiolite in 2/23 showed no ischemia, fixed   inferior  defect.  Echo in 8/23 showed EF 50% with severe asymmetric septal hypertrophy but no SAM or LVOT gradient.  Cardiac MRI in 12/23 was similar with LV EF 50%, severe asymmetric septal hypertrophy, normal RV, LGE in the basal septum. These studies were concerning for hypertrophic cardiomyopathy.  RHC in 1/24 showed elevated filling pressures with CI 2.25.  His grandfather may have had sudden cardiac death at work, father died at 59 from "heart disease."  He has had syncopal episodes that may have been orthostatic, but I worried about ventricular arrhythmias given possible HCM and LGE noted on cardiac MRI. Zio monitor in 2/24 showed 1.5% PVCs and 6 short NSVT runs (longest 7 beats).  Nothing to explain syncope and has had no further episodes. 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  PKP2 gene is associated with ARVC and Brugada, but the clinical significance of the mutation found in this patient is uncertain (not reported in literature).  Echo in 5/24 showed EF 45-50%, moderate asymmetric septal hypertrophy with no LVOT gradient of mitral valve SAM, RV normal, mild-moderate MR, IVC normal. Stable NYHA class II-III, not volume overloaded on exam. 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  - Still need to get him appointment with Sumy Joseph for genetic counseling regarding the mutations of uncertain significance.  - With no LVOT obstruction or significant murmur, ok to continue Bidil.  - Increase Coreg to 12.5 mg bid.  - I will continue torsemide 40 mg daily, BMET/BNP today..  - Continue dapagliflozin 10 mg daily.  - I think he is going to need an ICD with suspected HCM and some higher risk markers, though somewhat borderline. I would like him to see Sumy Joseph first.  Suspect HCM,  has some LGE on MRI but < 15% myocardium.  He has had some short NSVT runs on 1 week monitor (longest 7 beats).  No LVOT obstruction.  EF is mildly decreased at 45-50%.  He has had syncope but suspect not arrhythmic (had 1 syncopal episode while wearing monitor and no events).  Grandfather likely had SCD.  Will need referral to EP for ICD evaluation.  - Before ICD placement, I think that we should rule out CAD causing mild cardiomyopathy.  Creatinine has been improved recently. We discussed risks/benefits of right/left heart cath to assess for coronary disease and reassess filling pressures.  He agrees to procedure. He will hold apixaban the day before and day of.  2. CKD stage 3: BMET today.   3. COPD: Patient is active smoker and carries this history.  I strongly encouraged him to quit smoking today.  He is smoking much less than before.   4. Atrial flutter: S/p DCCV in 1/22.   - Continue apixaban.   Followup in 6 wks.   Daphna Lafuente 11/09/2022 

## 2022-11-09 NOTE — Patient Instructions (Signed)
Medication Changes:  Increase Carvedilol to 12.5 mg Twice daily   Lab Work:  Your physician recommends that you return for lab work in: THIS Surveyor, quantity at [facility] 1st desk on the right to check in (REGISTRATION)  Lab hours: Monday- Friday (7:30 am- 5:30 pm)  Testing/Procedures:  Your physician has requested that you have a cardiac catheterization. Cardiac catheterization is used to diagnose and/or treat various heart conditions. Doctors may recommend this procedure for a number of different reasons. The most common reason is to evaluate chest pain. Chest pain can be a symptom of coronary artery disease (CAD), and cardiac catheterization can show whether plaque is narrowing or blocking your heart's arteries. This procedure is also used to evaluate the valves, as well as measure the blood flow and oxygen levels in different parts of your heart. For further information please visit [email or web address] Please follow instruction below.  Referrals:  You have been referred to Dr Jomarie Longs for genetic counseling/testing, her office will call you for an appointment  You have been referred to EP to discuss getting a defibrillator placed, they will call you for an appointment  Special Instructions // Education:  Do the following things EVERYDAY: Weigh yourself in the morning before breakfast. Write it down and keep it in a log. Take your medicines as prescribed Eat low salt foods--Limit salt (sodium) to 2000 mg per day.  Stay as active as you can everyday Limit all fluids for the day to less than 2 liters   CATHETERIZATION INSTRUCTIONS:  You are scheduled for a Cardiac Catheterization on Friday, June 7 with Dr. Marca Ancona.  1. Please arrive at the [facility] (Main Entrance A) at [facility]: [address] [place], [place] [address] at 7:00 AM (This time is 2 hour(s) before your procedure to ensure your preparation). Free valet parking service is available. You will  check in at ADMITTING. The support person will be asked to wait in the waiting room.  It is OK to have someone drop you off and come back when you are ready to be discharged.    Special note: Every effort is made to have your procedure done on time. Please understand that emergencies sometimes delay scheduled procedures.  2. Diet: Do not eat solid foods after midnight.  The patient may have clear liquids until 5am upon the day of the procedure.  3. Labs: You will need to have blood drawn THIS WEEK,  4. Medication instructions in preparation for your procedure:   DO NOT TAKE ELIQUIS THURSDAY 6/6 OR FRIDAY 6/7  FRIDAY 6/7 AM DO NOT TAKE FARXIGA OR TORSEMIDE  On the morning of your procedure, take any morning medicines NOT listed above.  You may use sips of water.  5. Plan to go home the same day, you will only stay overnight if medically necessary. 6. Bring a current list of your medications and current insurance cards. 7. You MUST have a responsible person to drive you home. 8. Someone MUST be with you the first 24 hours after you arrive home or your discharge will be delayed. 9. Please wear clothes that are easy to get on and off and wear slip-on shoes.  Thank you for allowing [place] to care for you!   -- [facility]   Follow-Up in: 6 WEEKS    If you have any questions or concerns before your next appointment please send [place] a message through [place] or call our office at [phone] Monday-Friday 8  am-5 pm.   If you have an urgent need after hours on the weekend please call your Primary Cardiologist or the [facility] in [place] at [phone].

## 2022-11-10 ENCOUNTER — Other Ambulatory Visit
Admission: RE | Admit: 2022-11-10 | Discharge: 2022-11-10 | Disposition: A | Discharge status: Home / Self Care | Payer: Medicaid Other | Attending: Cardiology | Admitting: Cardiology

## 2022-11-10 ENCOUNTER — Other Ambulatory Visit: Payer: Medicaid Other | Admitting: Licensed Clinical Social Worker

## 2022-11-10 DIAGNOSIS — I5020 Unspecified systolic (congestive) heart failure: Secondary | ICD-10-CM

## 2022-11-10 LAB — BASIC METABOLIC PANEL
Anion gap: 9 (ref 5–15)
BUN: 24 mg/dL — ABNORMAL HIGH (ref 6–20)
CO2: 23 mmol/L (ref 22–32)
Calcium: 8.9 mg/dL (ref 8.9–10.3)
Chloride: 107 mmol/L (ref 98–111)
Creatinine, Ser: 1.94 mg/dL — ABNORMAL HIGH (ref 0.61–1.24)
GFR, Estimated: 39 mL/min — ABNORMAL LOW (ref >60)
Glucose, Bld: 94 mg/dL (ref 70–99)
Potassium: 4.2 mmol/L (ref 3.5–5.1)
Sodium: 139 mmol/L (ref 135–145)

## 2022-11-10 LAB — CBC
HCT: 44.8 % (ref 39.0–52.0)
Hemoglobin: 14.6 g/dL (ref 13.0–17.0)
MCH: 29.1 pg (ref 26.0–34.0)
MCHC: 32.6 g/dL (ref 30.0–36.0)
MCV: 89.4 fL (ref 80.0–100.0)
Platelets: 180 10*3/uL (ref 150–400)
RBC: 5.01 MIL/uL (ref 4.22–5.81)
RDW: 16 % — ABNORMAL HIGH (ref 11.5–15.5)
WBC: 8.1 10*3/uL (ref 4.0–10.5)
nRBC: 0 % (ref 0.0–0.2)

## 2022-11-10 LAB — BRAIN NATRIURETIC PEPTIDE: B Natriuretic Peptide: 447.1 pg/mL — ABNORMAL HIGH (ref 0.0–100.0)

## 2022-11-10 NOTE — Patient Outreach (Signed)
Medicaid Managed Care Social Work Note  11/10/2022 Name:  Chris [name] MRN:  [id number] DOB:  [birth year]  Chris [name] is an 62 y.o. year old male who is [name] primary patient of Chris Matar, MD.  The Medicaid Managed Care Coordination team was consulted for assistance with:  Mental Health Counseling and Resources  Chris [name] was given information about Medicaid Managed Care Coordination team services today. Chris [name] Patient agreed to services and verbal consent obtained.  Engaged with patient  for by telephone forfollow up visit in response to referral for case management and/or care coordination services.   Assessments/Interventions:  Review of past medical history, allergies, medications, health status, including review of consultants reports, laboratory and other test data, was performed as part of comprehensive evaluation and provision of chronic care management services.  SDOH: (Social Determinant of Health) assessments and interventions performed: SDOH Interventions    Flowsheet Row Patient Outreach Telephone from 11/10/2022 in [place] POPULATION HEALTH DEPARTMENT Patient Outreach Telephone from 10/25/2022 in [place] POPULATION HEALTH DEPARTMENT Patient Outreach Telephone from 10/20/2022 in [place] POPULATION HEALTH DEPARTMENT Patient Outreach Telephone from 08/27/2022 in [place] POPULATION HEALTH DEPARTMENT Patient Outreach Telephone from 08/19/2022 in [place] POPULATION HEALTH DEPARTMENT Patient Outreach Telephone from 07/27/2022 in [place] POPULATION HEALTH DEPARTMENT  SDOH Interventions        Transportation Interventions -- -- Payor Benefit -- -- --  Stress Interventions Offered [employer], Provide Counseling  [I am feeling better since I seen my doctor] Provide Counseling, Offered [employer] Resources -- [employer], Provide Counseling Offered [employer], Provide Counseling Provide  Counseling, Offered Community Wellness Resources       Advanced Directives Status:  See Care Plan for related entries.  Care Plan                 No Known Allergies  Medications Reviewed Today     Reviewed by Chris [name], CMA (Certified Medical Assistant) on 11/09/22 at 1158  Med List Status: <None>   Medication Order Taking? Sig Documenting Provider Last Dose Status Informant  Accu-Chek Softclix Lancets lancets [id number]  Use as instructed Chris Matar, MD  Active Self  acetaminophen (TYLENOL) 500 MG tablet [id number] Yes Take 500-1,000 mg by mouth every 6 (six) hours as needed (pain.). [provider] Taking Active Self  albuterol (PROVENTIL) (2.5 MG/3ML) 0.083% nebulizer solution [id number] Yes Take 3 mLs (2.5 mg total) by nebulization every 4 (four) hours as needed for wheezing or shortness of breath. Chris Matar, MD Taking Active   albuterol (VENTOLIN HFA) 108 (90 Base) MCG/ACT inhaler [id number] Yes Inhale 2 puffs into the lungs every 6 (six) hours as needed for wheezing or shortness of breath. Chris Matar, MD Taking Active   allopurinol (ZYLOPRIM) 100 MG tablet [id number] Yes TAKE 2 TABLETS (200 MG TOTAL) BY MOUTH DAILY. Chris Matar, MD Taking Active Self  apixaban (ELIQUIS) 5 MG TABS tablet [id number] Yes Take 1 tablet (5 mg total) by mouth 2 (two) times daily. Chris Odea, MD Taking Active            Med Note Chris [name], Chris [name]   Mon Sep 06, 2022 10:58 AM)    atorvastatin (LIPITOR) 40 MG tablet [id number] Yes Take 1 tablet (40 mg total) by mouth daily. Chris Freeze, FNP Taking Active Self  Blood Glucose Monitoring Suppl (ACCU-CHEK GUIDE) w/Device Chris [name] [id number] Yes Use as directed Chris Matar, MD Taking  Active Self  budesonide-formoterol (SYMBICORT) 160-4.5 MCG/ACT inhaler [id number] Yes Inhale 2 puffs into the lungs 2 (two) times daily. Chris Matar, MD Taking Active   carvedilol (COREG) 6.25 MG tablet [id number] Yes Take 1.5  tablets (9.375 mg total) by mouth 2 (two) times daily. Chris Morale, MD Taking Active   colchicine 0.6 MG tablet [id number] No Take 0.5 tablets (0.3 mg total) by mouth three times [name] week.  Patient not taking: Reported on 11/09/2022   Chris Matar, MD Not Taking Active            Med Note Chris [name], Chris [name]   Wed Oct 20, 2022  9:49 AM) Needs refill  dapagliflozin propanediol (FARXIGA) 10 MG TABS tablet [id number] Yes Take 1 tablet (10 mg total) by mouth daily. Chris Freeze, FNP Taking Active Self  DULoxetine (CYMBALTA) 30 MG capsule [id number] Yes Take 1 capsule (30 mg total) by mouth daily. Chris Pope, MD Taking Active   glucose blood (ACCU-CHEK GUIDE) test strip [id number]  Use as directed to check blood sugar 1-2 times [name] day Chris Matar, MD  Active Self  isosorbide-hydrALAZINE (BIDIL) 20-37.5 MG tablet [id number] Yes Take 1 tablet by mouth 3 (three) times daily. Chris Odea, MD Taking Active     Discontinued 07/11/20 [phone] (Discontinued by provider)   montelukast (SINGULAIR) 10 MG tablet [id number] Yes Take 1 tablet (10 mg total) by mouth at bedtime. Chris Matar, MD Taking Active   oxyCODONE-acetaminophen (PERCOCET) 5-325 MG tablet [id number] No Take 1 tablet by mouth every 4 (four) hours as needed for severe pain.  Patient not taking: Reported on 09/28/2022   Chris Antis, MD Not Taking Active   potassium chloride (KLOR-CON M) 10 MEQ tablet [id number] Yes Take 2 tablets (20 mEq total) by mouth daily. Chris Morale, MD Taking Active   Tiotropium Bromide Monohydrate (SPIRIVA RESPIMAT) 2.5 MCG/ACT AERS [id number] Yes Inhale 2 puffs into the lungs daily. Chris Matar, MD Taking Active   torsemide (DEMADEX) 20 MG tablet [id number] Yes Take 40 mg by mouth daily. [provider] Taking Active   traZODone (DESYREL) 50 MG tablet [id number] Yes Take 1-2 tablets (50-100 mg total) by mouth at bedtime. Chris Pope, MD Taking Active            Med Note Chris [name], Chris  [name]   Wed Oct 20, 2022  9:51 AM) Needs to pick up            Patient Active Problem List   Diagnosis Date Noted   Post traumatic stress disorder 09/23/2022   MDD (major depressive disorder), recurrent episode, moderate (HCC) 08/26/2022   Stage 3b chronic kidney disease (HCC) 08/09/2022   Hypertrophic cardiomyopathy (HCC) 08/09/2022   History of renal cell cancer 08/09/2022   Chronic heart failure with preserved ejection fraction (HFpEF) (HCC) 07/13/2022   Agitation 04/23/2022   RSV (respiratory syncytial virus pneumonia) 04/22/2022   Obesity (BMI 30-39.9) 04/21/2022   COPD exacerbation (HCC) 04/19/2022   Dyslipidemia 04/19/2022   Gout 04/19/2022   Right kidney mass 05/14/2021   CHF exacerbation (HCC) 04/14/2021   Chest pain 04/14/2021   Syncope 04/14/2021   Left-sided weakness 10/28/2020   Typical atrial flutter (HCC)    CHF (congestive heart failure) (HCC) 07/04/2020   Acute exacerbation of CHF (congestive heart failure) (HCC) 06/16/2020   Influenza vaccine refused 05/06/2020   Acute decompensated heart failure (HCC) 05/04/2020   Illiteracy 05/04/2020   Type 2 diabetes mellitus with stage 3 chronic kidney disease (  HCC) 12/25/2019   Elevated troponin I level 10/26/2019   History of gout 02/01/2019   Seasonal allergic rhinitis due to pollen 02/01/2019   Tobacco dependence 11/30/2018   Microscopic hematuria 11/30/2018   Depression 11/30/2018   Difficulty controlling anger 11/30/2018   COPD (chronic obstructive pulmonary disease) (HCC)    CKD (chronic kidney disease) stage 3, GFR 30-59 ml/min (HCC) 08/10/2018   Recurrent epistaxis 04/21/2018   Mixed hyperlipidemia 07/28/2017   Essential hypertension 07/28/2017   Chronic systolic heart failure (HCC) 10/25/2014   Cocaine abuse (HCC) 02/20/2013   Cannabis abuse 02/20/2013   Back pain, chronic 02/20/2013    Conditions to be addressed/monitored per PCP order:  Depression  Care Plan : General Social Work (Adult)  Updates  made by Gustavus Bryant, LCSW since 11/10/2022 12:00 AM     Problem: Depression Identification (Depression)      Long-Range Goal: To increase my self care and gain mental health support   Start Date: 07/08/2022  Priority: High  Note:   Timeframe:  Long-Range Goal Priority:  High Start Date:   07/08/22                    Expected End Date:ongoing    Follow Up Date- 12/09/22 at 10 am   - begin personal counseling - call and visit an old friend - check out volunteer opportunities - join [name] support group - laugh; watch [name] funny movie or comedian - learn and use visualization or guided imagery - perform [name] random act of kindness - practice relaxation or meditation daily - start or continue [name] personal journal - talk about feelings with [name] friend, family or spiritual advisor - practice positive thinking and self-talk    Why is this important?   When you are stressed, down or upset, your body reacts too.  For example, your blood pressure may get higher; you may have [name] headache or stomachache.  When your emotions get the best of you, your body's ability to fight off cold and flu gets weak.  These steps will help you manage your emotions.   Current Barriers:  Chronic Mental Health needs related to depression and need for housing Limited social support, Housing barriers, and Mental Health Concerns  Social Isolation ADL IADL limitations Suicidal Ideation/Homicidal Ideation: No  Clinical Social Work Goal(s):  Over the next 120 days, patient will work with LCSW monthly by telephone or in person to reduce or manage symptoms related to depression and relapse prevention patient will work with [name] to address needs related to financial, food and transportation support. Patient wishes to offset medical and housing expenses with financial support as housing resources are very limited at this time  Interventions: Patient interviewed and appropriate assessments performed: brief mental health  assessment Discussed plans with patient for ongoing care management follow up and provided patient with direct contact information for care management team Assisted patient/caregiver with obtaining information about health plan benefits Patient was educated on the Advance Directives process. Patient was encouraged to complete document with PCP.  Advance Directive education was provided to patient again on 07/08/22 and 08/19/22 Encouraged patient to consider [name] mental health provider for long term follow up and therapy/counseling and patient is now agreeable. Referral made to [facility] for both counseling and psychiatry.  Patient reports recent agitation due to his inability to meet his daily mental and physical needs. [facility] LCSW provided emotional support and reflective listening.  Solution-Focused Strategies, Mindfulness or Relaxation Training, Active listening /  Reflection utilized , Emotional Supportive Provided, and Verbalization of feelings encouraged  Patient declines any current substance use. Patient has [name] history of cocaine abuse. Relapse prevention education provided.  Patient reports that he has developed social anxiety. He admits that he gets irritable at times and will snap at his loved ones and then will experience guilt afterwards. Educated patient on coping methods to implement into his daily life to combat anxiety symptoms and stress. Patient denied any current suicidal or homicidal ideations. Encouraged patient to implement deep breathing and grounding exercises into his daily routine due to ongoing anxiety and SOB.  Patient reports that he will start implementing appropriate self-care habits into his daily routine such as: drinking water, staying active around the house, taking his medications as prescribed, combating negative thoughts or emotions and staying connected with his support network. Positive reinforcement provided.  [facility] LCSW made referral for [facility] [name] and [facility] RNCM involvement. Message  sent to entire team by [facility] LCSW. 07/27/22 LCSW update- Patient and [facility] LCSW contacted [facility] together and scheduled him for both psychiatry and counseling next month. Patient's counseling is virtual so he will not need transportation assistance for this appointment only for psychiatry. [facility] LCSW will update [facility] [name]. [facility] LCSW had patient physically write down his appointment reminders as he is having issues with his memory. Self-care education provided. [facility] LCSW 08/19/22 update- Patient and [facility] LCSW contacted [facility] together and left [name] message regarding his upcoming appointments as he has scheduled transportation for BOTH appointments even though of one those appointments is virtual. However, he has not filled out their intake paperwork and will need [name] ride to do this in person. [facility] LCSW encouraged patient to keep trying to reach [facility] in order to gain appropriate instructions on what to expect during his first visit. 08/27/22 update- Patient was able to successfully attend his psychiatry appointment yesterday. He was reminded of his upcoming appointments at [facility] for next month. He reports being in [name] lot of pain which has triggered his stress. Emotional support and resource education provided. LCSW update- Patient successfully went to [facility] for therapy and has [name] scheduled psychiatry visit next week. Patient reports that self care and stress management tools are not effectively working for him due to the amount of pain he is in. Care coordination completed with [facility] RNCM today. Update- Patient has upcoming initial therapy appointment on 5/16 at 3 pm and was advised to get there early to do their enrollment paperwork. He has an overlapping appointment that same day at 4:15 in [place] and was advised to let his care providers know. Crisis support resources reviewed as well. Update- Patient was provided positive feedback for attending cardiologist, PCP, counseling and psychiatry appointments over the last 30 days. Patient  has follow up psychiatry appointment tomorrow and confirms stable transportation to this. He reports that his new medicines that his psychiatrist prescribes are helping especially regarding his sleep at night due to his chronic pain.  09/15/21: [name] completed telephone outreach with patient for utility assistance. Patient stated that he has [name] cut off notice for 09/25/21 and his bill is $700.00. [name] informed patient he can go to the [facility] and can also [employer]. [name] provided patient with the telephone number for [employer]. Patient stated no other resources are needed at this time.   Patient Self Care Activities:  Ability for insight Agreement to start mental health treatment and to the self-care journey   Patient Coping  Strengths:  Self Advocate Able to Communicate Effectively  Patient Self Care Deficits:  Lacks social connections  The following coping skill education was provided for stress relief and mental health management: "When your car dies or [name] deadline looms, how do you respond? Long-term, low-grade or acute stress takes [name] serious toll on your body and mind, so don't ignore feelings of constant tension. Stress is [name] natural part of life. However, too much stress can harm our health, especially if it continues every day. This is chronic stress and can put you at risk for heart problems like heart disease and depression. Understand what's happening inside your body and learn simple coping skills to combat the negative impacts of everyday stressors.  Types of Stress There are two types of stress: Emotional - types of emotional stress are relationship problems, pressure at work, financial worries, experiencing discrimination or having [name] major life change. Physical - Examples of physical stress include being sick having pain, not sleeping well, recovery from an injury or having an alcohol and drug use disorder. Fight or Flight Sudden or  ongoing stress activates your nervous system and floods your bloodstream with adrenaline and cortisol, two hormones that raise blood pressure, increase heart rate and spike blood sugar. These changes pitch your body into [name] fight or flight response. That enabled our ancestors to outrun saber-toothed tigers, and it's helpful today for situations like dodging [name] car accident. But most modern chronic stressors, such as finances or [name] challenging relationship, keep your body in that heightened state, which hurts your health. Effects of Too Much Stress If constantly under stress, most of [place] will eventually start to function less well.  Multiple studies link chronic stress to [name] higher risk of heart disease, stroke, depression, weight gain, memory loss and even premature death, so it's important to recognize the warning signals. Talk to your doctor about ways to manage stress if you're experiencing any of these symptoms: Prolonged periods of poor sleep. Regular, severe headaches. Unexplained weight loss or gain. Feelings of isolation, withdrawal or worthlessness. Constant anger and irritability. Loss of interest in activities. Constant worrying or obsessive thinking. Excessive alcohol or drug use. Inability to concentrate.  10 Ways to Cope with Chronic Stress It's key to recognize stressful situations as they occur because it allows you to focus on managing how you react. We all need to know when to close our eyes and take [name] deep breath when we feel tension rising. Use these tips to prevent or reduce chronic stress. 1. Rebalance Work and Home All work and no play? If you're spending too much time at the office, intentionally put more dates in your calendar to enjoy time for fun, either alone or with others. 2. Get Regular Exercise Moving your body on [name] regular basis balances the nervous system and increases blood circulation, helping to flush out stress hormones. Even [name] daily 20-minute walk makes [name]  difference. Any kind of exercise can lower stress and improve your mood ? just pick activities that you enjoy and make it [name] regular habit. 3. Eat Well and Limit Alcohol and Stimulants Alcohol, nicotine and caffeine may temporarily relieve stress but have negative health impacts and can make stress worse in the long run. Well-nourished bodies cope better, so start with [name] good breakfast, add more organic fruits and vegetables for [name] well-balanced diet, avoid processed foods and sugar, try herbal tea and drink more water. 4. Connect with Supportive People Talking face to face with another person releases  hormones that reduce stress. Lean on those good listeners in your life. 5. Carve Out Hobby Time Do you enjoy gardening, reading, listening to music or some other creative pursuit? Engage in activities that bring you pleasure and joy; research shows that reduces stress by almost half and lowers your heart rate, too. 6. Practice Meditation, Stress Reduction or Yoga Relaxation techniques activate [name] state of restfulness that counterbalances your body's fight-or-flight hormones. Even if this also means [name] 10-minute break in [name] long day: listen to music, read, go for [name] walk in nature, do [name] hobby, take [name] bath or spend time with [name] friend. Also consider doing [name] mindfulness exercise or try [name] daily deep breathing or imagery practice. Deep Breathing Slow, calm and deep breathing can help you relax. Try these steps to focus on your breathing and repeat as needed. Find [name] comfortable position and close your eyes. Exhale and drop your shoulders. Breathe in through your nose; fill your lungs and then your belly. Think of relaxing your body, quieting your mind and becoming calm and peaceful. Breathe out slowly through your nose, relaxing your belly. Think of releasing tension, pain, worries or distress. Repeat steps three and four until you feel relaxed. Imagery This involves using your mind to excite the senses -- sound,  vision, smell, taste and feeling. This may help ease your stress. Begin by getting comfortable and then do some slow breathing. Imagine [name] place you love being at. It could be somewhere from your childhood, somewhere you vacationed or just [name] place in your imagination. Feel how it is to be in the place you're imagining. Pay attention to the sounds, air, colors, and who is there with you. This is [name] place where you feel cared for and loved. All is well. You are safe. Take in all the smells, sounds, tastes and feelings. As you do, feel your body being nourished and healed. Feel the calm that surrounds you. Breathe in all the good. Breathe out any discomfort or tension. 7. Sleep Enough If you get less than seven to eight hours of sleep, your body won't tolerate stress as well as it could. If stress keeps you up at night, address the cause, and add extra meditation into your day to make up for the lost z's. Try to get seven to nine hours of sleep each night. Make [name] regular bedtime schedule. Keep your room dark and cool. Try to avoid computers, TV, cell phones and tablets before bed. 8. Bond with Connections You Enjoy Go out for [name] coffee with [name] friend, chat with [name] neighbor, call [name] family member, visit with [name] clergy member, or even hang out with your pet. Clinical studies show that spending even [name] short time with [name] companion animal can cut anxiety levels almost in half. 9. Take [name] Vacation Getting away from it all can reset your stress tolerance by increasing your mental and emotional outlook, which makes you [name] happier, more productive person upon return. Leave your cellphone and laptop at home! 10. See [name] Counselor, Coach or Therapist If negative thoughts overwhelm your ability to make positive changes, it's time to seek professional help. Make an appointment today--your health and life are worth it."     24- Hour Availability:    [facility]  [address] [place], [place]  [place] [phone] Crisis [phone]   [facility] [phone]   [place] [facility]  [phone]    [facility]  Crisis Services  [phone] (after hours)   Therapeutic Alternative/Mobile Crisis   [phone]   [place] National Suicide Hotline  [phone] (TALK) OR 5054836563   Call 911 or go to emergency room   [facility]  [phone]);  [place] and [employer]  [phone]); [place], [place], [place], [place], [place], [place], [place]        Follow up goal        09/23/2022    3:40 PM 08/26/2022    1:38 PM 08/09/2022    9:54 AM 07/08/2022   10:52 AM 05/11/2022    4:17 PM  Depression screen PHQ 2/9  Decreased Interest 3 3 3 2 1   Down, Depressed, Hopeless 3 2 2 2 1   PHQ - 2 Score 6 5 5 4 2   Altered sleeping 3 3 3 3 3   Tired, decreased energy 3 3 3 3 3   Change in appetite 0 1 1 2 2   Feeling bad or failure about yourself  3 1 1 2 2   Trouble concentrating 3 3 3 2 2   Moving slowly or fidgety/restless 3 1 1  0 0  Suicidal thoughts 0 0 0 0 0  PHQ-9 Score 21 17 17 16 14   Difficult doing work/chores Very difficult   Very difficult       09/23/2022    3:39 PM 08/09/2022    9:54 AM 05/11/2022    4:18 PM 12/21/2021   11:44 AM  GAD 7 : Generalized Anxiety Score  Nervous, Anxious, on Edge 3 2 2 2   Control/stop worrying 3 3 2 1   Worry too much - different things 3 3 3 2   Trouble relaxing 3 3 3 2   Restless 3 1 2 1   Easily annoyed or irritable 3 0 2 0  Afraid - awful might happen 2 3 2  0  Total GAD 7 Score 20 15 16 8   Anxiety Difficulty Very difficult       Follow up:  Patient agrees to Care Plan and Follow-up.  Plan: The Managed Medicaid care management team will reach out to the patient again over the next 30 days.  Dickie La, [name], MSW, [employer] Managed Medicaid LCSW [facility]  [facility] [email or web address] Phone: [phone]

## 2022-11-10 NOTE — Patient Instructions (Signed)
Visit Information  Chris [name] was given information about Medicaid Managed Care team care coordination services as a part of their Healthy [facility] Medicaid benefit. Chris [name] verbally consented to engagement with the [facility] Managed Care team.   If you are experiencing a medical emergency, please call 911 or report to your local emergency department or urgent care.   If you have a non-emergency medical problem during routine business hours, please contact your provider's office and ask to speak with a nurse.   For questions related to your Healthy [facility] health plan, please call: [phone] or visit the homepage here: [email or web address]  If you would like to schedule transportation through your Healthy [facility] plan, please call the following number at least 2 days in advance of your appointment: [phone]  For information about your ride after you set it up, call Ride Assist at [phone]. Use this number to activate a Will Call pickup, or if your transportation is late for a scheduled pickup. Use this number, too, if you need to make a change or cancel a previously scheduled reservation.  If you need transportation services right away, call [phone]. The after-hours call center is staffed 24 hours to handle ride assistance and urgent reservation requests (including discharges) 365 days a year. Urgent trips include sick visits, hospital discharge requests and life-sustaining treatment.  Call the [facility] Line at [phone], at any time, 24 hours a day, 7 days a week. If you are in danger or need immediate medical attention call 911.  If you would like help to quit smoking, call 1-800-QUIT-NOW ((787)295-2237) OR Espaol: 1-855-Djelo-Ya (4-742-595-6387) o para ms informacin haga clic aqu or Text READY to 564-332 to register via text   Following is a copy of your plan of care:  Care Plan : General Social Work  (Adult)  Updates made by Gustavus Bryant, LCSW since 11/10/2022 12:00 AM     Problem: Depression Identification (Depression)      Long-Range Goal: To increase my self care and gain mental health support   Start Date: 07/08/2022  Priority: High  Note:   Timeframe:  Long-Range Goal Priority:  High Start Date:   07/08/22                    Expected End Date:ongoing    Follow Up Date- 12/09/22 at 10 am   - begin personal counseling - call and visit an old friend - check out volunteer opportunities - join a support group - laugh; watch a funny movie or comedian - learn and use visualization or guided imagery - perform a random act of kindness - practice relaxation or meditation daily - start or continue a personal journal - talk about feelings with a friend, family or spiritual advisor - practice positive thinking and self-talk    Why is this important?   When you are stressed, down or upset, your body reacts too.  For example, your blood pressure may get higher; you may have a headache or stomachache.  When your emotions get the best of you, your body's ability to fight off cold and flu gets weak.  These steps will help you manage your emotions.   Current Barriers:  Chronic Mental Health needs related to depression and need for housing Limited social support, Housing barriers, and Mental Health Concerns  Social Isolation ADL IADL limitations Suicidal Ideation/Homicidal Ideation: No  Clinical Social Work Goal(s):  Over the next 120 days, patient will work with [employer] monthly  by telephone or in person to reduce or manage symptoms related to depression and relapse prevention patient will work with [name] to address needs related to financial, food and transportation support. Patient wishes to offset medical and housing expenses with financial support as housing resources are very limited at this time  Patient Self Care Activities:  Ability for insight Agreement to start mental health  treatment and to the self-care journey   Patient Coping Strengths:  Self Advocate Able to Communicate Effectively  Patient Self Care Deficits:  Lacks social connections  The following coping skill education was provided for stress relief and mental health management: "When your car dies or a deadline looms, how do you respond? Long-term, low-grade or acute stress takes a serious toll on your body and mind, so don't ignore feelings of constant tension. Stress is a natural part of life. However, too much stress can harm our health, especially if it continues every day. This is chronic stress and can put you at risk for heart problems like heart disease and depression. Understand what's happening inside your body and learn simple coping skills to combat the negative impacts of everyday stressors.  Types of Stress There are two types of stress: Emotional - types of emotional stress are relationship problems, pressure at work, financial worries, experiencing discrimination or having a major life change. Physical - Examples of physical stress include being sick having pain, not sleeping well, recovery from an injury or having an alcohol and drug use disorder. Fight or Flight Sudden or ongoing stress activates your nervous system and floods your bloodstream with adrenaline and cortisol, two hormones that raise blood pressure, increase heart rate and spike blood sugar. These changes pitch your body into a fight or flight response. That enabled our ancestors to outrun saber-toothed tigers, and it's helpful today for situations like dodging a car accident. But most modern chronic stressors, such as finances or a challenging relationship, keep your body in that heightened state, which hurts your health. Effects of Too Much Stress If constantly under stress, most of [place] will eventually start to function less well.  Multiple studies link chronic stress to a higher risk of heart disease, stroke, depression,  weight gain, memory loss and even premature death, so it's important to recognize the warning signals. Talk to your doctor about ways to manage stress if you're experiencing any of these symptoms: Prolonged periods of poor sleep. Regular, severe headaches. Unexplained weight loss or gain. Feelings of isolation, withdrawal or worthlessness. Constant anger and irritability. Loss of interest in activities. Constant worrying or obsessive thinking. Excessive alcohol or drug use. Inability to concentrate.  10 Ways to Cope with Chronic Stress It's key to recognize stressful situations as they occur because it allows you to focus on managing how you react. We all need to know when to close our eyes and take a deep breath when we feel tension rising. Use these tips to prevent or reduce chronic stress. 1. Rebalance Work and Home All work and no play? If you're spending too much time at the office, intentionally put more dates in your calendar to enjoy time for fun, either alone or with others. 2. Get Regular Exercise Moving your body on a regular basis balances the nervous system and increases blood circulation, helping to flush out stress hormones. Even a daily 20-minute walk makes a difference. Any kind of exercise can lower stress and improve your mood ? just pick activities that you enjoy and make it a regular habit.  3. Eat Well and Limit Alcohol and Stimulants Alcohol, nicotine and caffeine may temporarily relieve stress but have negative health impacts and can make stress worse in the long run. Well-nourished bodies cope better, so start with a good breakfast, add more organic fruits and vegetables for a well-balanced diet, avoid processed foods and sugar, try herbal tea and drink more water. 4. Connect with Supportive People Talking face to face with another person releases hormones that reduce stress. Lean on those good listeners in your life. 5. Carve Out Hobby Time Do you enjoy gardening,  reading, listening to music or some other creative pursuit? Engage in activities that bring you pleasure and joy; research shows that reduces stress by almost half and lowers your heart rate, too. 6. Practice Meditation, Stress Reduction or Yoga Relaxation techniques activate a state of restfulness that counterbalances your body's fight-or-flight hormones. Even if this also means a 10-minute break in a long day: listen to music, read, go for a walk in nature, do a hobby, take a bath or spend time with a friend. Also consider doing a mindfulness exercise or try a daily deep breathing or imagery practice. Deep Breathing Slow, calm and deep breathing can help you relax. Try these steps to focus on your breathing and repeat as needed. Find a comfortable position and close your eyes. Exhale and drop your shoulders. Breathe in through your nose; fill your lungs and then your belly. Think of relaxing your body, quieting your mind and becoming calm and peaceful. Breathe out slowly through your nose, relaxing your belly. Think of releasing tension, pain, worries or distress. Repeat steps three and four until you feel relaxed. Imagery This involves using your mind to excite the senses -- sound, vision, smell, taste and feeling. This may help ease your stress. Begin by getting comfortable and then do some slow breathing. Imagine a place you love being at. It could be somewhere from your childhood, somewhere you vacationed or just a place in your imagination. Feel how it is to be in the place you're imagining. Pay attention to the sounds, air, colors, and who is there with you. This is a place where you feel cared for and loved. All is well. You are safe. Take in all the smells, sounds, tastes and feelings. As you do, feel your body being nourished and healed. Feel the calm that surrounds you. Breathe in all the good. Breathe out any discomfort or tension. 7. Sleep Enough If you get less than seven to eight  hours of sleep, your body won't tolerate stress as well as it could. If stress keeps you up at night, address the cause, and add extra meditation into your day to make up for the lost z's. Try to get seven to nine hours of sleep each night. Make a regular bedtime schedule. Keep your room dark and cool. Try to avoid computers, TV, cell phones and tablets before bed. 8. Bond with Connections You Enjoy Go out for a coffee with a friend, chat with a neighbor, call a family member, visit with a clergy member, or even hang out with your pet. Clinical studies show that spending even a short time with a companion animal can cut anxiety levels almost in half. 9. Take a Vacation Getting away from it all can reset your stress tolerance by increasing your mental and emotional outlook, which makes you a happier, more productive person upon return. Leave your cellphone and laptop at home! 10. See a Veterinary surgeon, Educational psychologist  If negative thoughts overwhelm your ability to make positive changes, it's time to seek professional help. Make an appointment today--your health and life are worth it."     24- Hour Availability:    [facility]  [address] [place], [place] [phone] Crisis [phone]   [facility] [phone]   [place] [facility]  [phone]    [facility]  [phone] (after hours)   Therapeutic Alternative/Mobile Crisis   [phone]   [place] National Suicide Hotline  6841637584 Len Childs) [place] 063   Call 911 or go to emergency room   [facility]  [phone]);  [place] and [employer]  [phone]); [place], [place], [place], [place], [place], [place], [place]        Follow up goal  10 LITTLE Things To Do When You're Feeling Too Down To Do Anything  Take a shower. Even if you plan to stay in all day long and not see a soul, take a shower. It  takes the most effort to hop in to the shower but once you do, you'll feel immediate results. It will wake you up and you'll be feeling much fresher (and cleaner too).  Brush and floss your teeth. Give your teeth a good brushing with a floss finish. It's a small task but it feels so good and you can check 'taking care of your health' off the list of things to do.  Do something small on your list. Most of [place] have some small thing we would like to get done (load of laundry, sew a button, email a friend). Doing one of these things will make you feel like you've accomplished something.  Drink water. Drinking water is easy right? It's also really beneficial for your health so keep a glass beside you all day and take sips often. It gives you energy and prevents you from boredom eating.  Do some floor exercises. The last thing you want to do is exercise but it might be just the thing you need the most. Keep it simple and do exercises that involve sitting or laying on the floor. Even the smallest of exercises release chemicals in the brain that make you feel good. Yoga stretches or core exercises are going to make you feel good with minimal effort.  Make your bed. Making your bed takes a few minutes but it's productive and you'll feel relieved when it's done. An unmade bed is a huge visual reminder that you're having an unproductive day. Do it and consider it your housework for the day.  Put on some nice clothes. Take the sweatpants off even if you don't plan to go anywhere. Put on clothes that make you feel good. Take a look in the mirror so your brain recognizes the sweatpants have been replaced with clothes that make you look great. It's an instant confidence booster.  Wash the dishes. A pile of dirty dishes in the sink is a reflection of your mood. It's possible that if you wash up the dishes, your mood will follow suit. It's worth a try.  Cook a real meal. If you have the luxury to have a "do  nothing" day, you have time to make a real meal for yourself. Make a meal that you love to eat. The process is good to get you out of the funk and the food will ensure you have more energy for tomorrow.  Write out your thoughts by  hand. When you hand write, you stimulate your brain to focus on the moment that you're in so make yourself comfortable and write whatever comes into your mind. Put those thoughts out on paper so they stop spinning around in your head. Those thoughts might be the very thing holding you down.  Dickie La, [name], MSW, [employer] Managed Medicaid LCSW [facility]  [facility] [email or web address] Phone: [phone]

## 2022-11-11 ENCOUNTER — Encounter (HOSPITAL_COMMUNITY): Payer: Medicaid Other | Admitting: Student

## 2022-11-11 ENCOUNTER — Ambulatory Visit: Payer: Medicaid Other | Admitting: Cardiology

## 2022-11-11 NOTE — Progress Notes (Deleted)
BH MD Outpatient Progress Note  11/11/2022 9:39 AM Chris [name]  MRN:  [id number]  Assessment:  Chris [name] presents for follow-up evaluation in-person. Today, 11/11/22, patient reports ***  Identifying Information: Chris [name] is a 62 y.o. y.o. male with a history of *** who is an established patient with [facility] for medication management.   Plan: # Major Depressive Disorder-recurrent episode, moderate Past medication trials:  Status of problem: improving Interventions: -- Continue duloxetine 30 mg daily             -GFR improved to 44, will continue to monitor --Continue trazodone 50-100 mg qhs prn for insomnia  Patient was given contact information for behavioral health clinic and was instructed to call 911 for emergencies.   Subjective:  Chief Complaint: No chief complaint on file.   Interval History: ***  Visit Diagnosis: No diagnosis found.  Past Medical History:  Past Medical History:  Diagnosis Date   Arrhythmia    atrial flutter   CHF (congestive heart failure) (HCC)    Chronic kidney disease    COPD (chronic obstructive pulmonary disease) (HCC)    Coronary artery disease    Depression    Diabetes mellitus without complication (HCC)    GERD (gastroesophageal reflux disease)    Gout    Hypertension    Influenza A with respiratory manifestations    Mental disorder     Past Surgical History:  Procedure Laterality Date   ANKLE SURGERY     CARDIAC CATHETERIZATION     CARDIOVERSION N/A 07/08/2020   Procedure: CARDIOVERSION;  Surgeon: Lamar Blinks, MD;  Location: [facility] ORS;  Service: Cardiovascular;  Laterality: N/A;   COLONOSCOPY WITH PROPOFOL N/A 11/19/2021   Procedure: COLONOSCOPY WITH PROPOFOL;  Surgeon: Jenel Lucks, MD;  Location: [facility];  Service: Gastroenterology;  Laterality: N/A;   HERNIA REPAIR     x2   IR RADIOLOGIST EVAL & MGMT  05/13/2022   IR RADIOLOGIST EVAL & MGMT  10/27/2022   POLYPECTOMY   11/19/2021   Procedure: POLYPECTOMY;  Surgeon: Jenel Lucks, MD;  Location: [facility];  Service: Gastroenterology;;   RADIOLOGY WITH ANESTHESIA N/A 06/23/2022   Procedure: Right renal tumor ablation;  Surgeon: Bennie Dallas, MD;  Location: [facility] OR;  Service: Radiology;  Laterality: N/A;   RIGHT HEART CATH N/A 07/13/2022   Procedure: RIGHT HEART CATH;  Surgeon: Laurey Morale, MD;  Location: [facility] INVASIVE CV LAB;  Service: Cardiovascular;  Laterality: N/A;   SHOULDER SURGERY     TEE WITHOUT CARDIOVERSION N/A 07/08/2020   Procedure: TRANSESOPHAGEAL ECHOCARDIOGRAM (TEE);  Surgeon: Lamar Blinks, MD;  Location: [facility] ORS;  Service: Cardiovascular;  Laterality: N/A;    Family History:  Family History  Problem Relation Age of Onset   Heart disease Father    Diabetes Mother    HIV Brother    Healthy Son    Healthy Daughter     Social History:  Social History   Socioeconomic History   Marital status: Divorced    Spouse name: Not on file   Number of children: 3   Years of education: Not on file   Highest education level: High school graduate  Occupational History   Occupation: disability  Tobacco Use   Smoking status: Every Day    Packs/day: 1.00    Years: 43.00    Additional pack years: 0.00    Total pack years: 43.00    Types: Cigarettes   Smokeless tobacco:  Never   Tobacco comments:        4 cigs daily--05/12/2022  Vaping Use   Vaping Use: Never used  Substance and Sexual Activity   Alcohol use: Yes    Alcohol/week: 4.0 standard drinks of alcohol    Types: 4 Shots of liquor per week   Drug use: Yes    Frequency: 21.0 times per week    Types: Marijuana    Comment: last use Cocaine- 03/28/2021. Still using marijuana, last use 06/22/21   Sexual activity: Not on file  Other Topics Concern   Not on file  Social History Narrative   ** Merged History Encounter **       Social Determinants of Health   Financial Resource Strain: Medium Risk (07/23/2022)   Overall  Financial Resource Strain (CARDIA)    Difficulty of Paying Living Expenses: Somewhat hard  Food Insecurity: No Food Insecurity (04/20/2022)   Hunger Vital Sign    Worried About Running Out of Food in the Last Year: Never true    Ran Out of Food in the Last Year: Never true  Transportation Needs: Unmet Transportation Needs (10/20/2022)   PRAPARE - Administrator, Civil Service (Medical): Yes    Lack of Transportation (Non-Medical): Yes  Physical Activity: Insufficiently Active (11/16/2021)   Exercise Vital Sign    Days of Exercise per Week: 5 days    Minutes of Exercise per Session: 20 min  Stress: Stress Concern Present (11/10/2022)   [employer] of Occupational Health - Occupational Stress Questionnaire    Feeling of Stress : To some extent  Social Connections: Moderately Isolated (07/23/2022)   Social Connection and Isolation Panel [NHANES]    Frequency of Communication with Friends and Family: Twice a week    Frequency of Social Gatherings with Friends and Family: Once a week    Attends Religious Services: More than 4 times per year    Active Member of [employer] or Organizations: No    Attends Engineer, structural: Never    Marital Status: Divorced    Allergies: No Known Allergies  ROS: All other ROS negative besides those noted in interval history  Objective: Psychiatric Specialty Exam: There were no vitals taken for this visit.There is no height or weight on file to calculate BMI. General Appearance: {Appearance:22683}  Eye Contact:  {BHH EYE CONTACT:22684}  Speech:  {Speech:22685}  Volume:  {Volume (PAA):22686}  Mood:  {BHH MOOD:22306}  Affect:  {Affect (PAA):22687}  Thought Process:  {Thought Process (PAA):22688}  Orientation:  {BHH ORIENTATION (PAA):22689}  Thought Content: {Thought Content:22690}   Suicidal Thoughts:  {ST/HT (PAA):22692}  Homicidal Thoughts:  {ST/HT (PAA):22692}  Memory:  {BHH ZOXWRU:04540}  Judgment:  {Judgement (PAA):22694}   Insight:  {Insight (PAA):22695}  Psychomotor Activity:  {Psychomotor (PAA):22696}  Concentration:  {Concentration:21399} Assets:  {Assets (PAA):22698}  ADL's:  {BHH JWJ'X:91478}  Cognition: {chl bhh cognition:304700322}  Sleep:  {BHH GOOD/FAIR/POOR:22877}      PE: General: well-appearing; no acute distress  Pulm: no increased work of breathing on room air  Strength & Muscle Tone: {desc; muscle tone:32375} Neuro: no focal neurological deficits observed  Gait & Station: {PE GAIT ED GNFA:21308}  Metabolic Disorder Labs: Lab Results  Component Value Date   HGBA1C 6.2 08/09/2022   MPG 131.24 04/20/2022   MPG 151.33 02/09/2021   No results found for: "PROLACTIN" Lab Results  Component Value Date   CHOL 176 12/25/2019   TRIG 91 12/25/2019   HDL 37 (L) 12/25/2019   CHOLHDL 4.8  12/25/2019   VLDL 18 12/25/2019   LDLCALC 121 (H) 12/25/2019   LDLCALC 92 11/07/2019   Lab Results  Component Value Date   TSH 1.592 10/26/2019   TSH 0.842 11/30/2018    Therapeutic Level Labs: No results found for: "LITHIUM" No results found for: "VALPROATE" No results found for: "CBMZ"  Screenings: AUDIT    Flowsheet Row [facility]  Alcohol Use Disorder Identification Test Final Score (AUDIT) 1      GAD-7    Flowsheet Row Counselor from 09/23/2022 in [facility] Office Visit from 08/09/2022 in [facility] Office Visit from 05/11/2022 in [facility] Office Visit from 12/21/2021 in [facility] Office Visit from 05/14/2021 in [facility]  Total GAD-7 Score 20 15 16 8 5       PHQ2-9    Flowsheet Row Counselor from 09/23/2022 in [facility] Office Visit from 08/26/2022 in [facility] Office Visit from  08/09/2022 in [facility] Patient Outreach Telephone from 07/08/2022 in [place] [facility] Office Visit from 05/11/2022 in [facility]  PHQ-2 Total Score 6 5 5 4 2   PHQ-9 Total Score 21 17 17 16 14       Flowsheet Row ED from 07/20/2022 in [facility] Emergency Department at [facility] Admission (Discharged) from 07/13/2022 in [facility] CARDIAC CATH LAB ED from 06/29/2022 in [facility] Emergency Department at [facility]  C-SSRS RISK CATEGORY No Risk No Risk No Risk       A total of *** minutes was spent involved in face to face clinical care, chart review, and documentation.   Park Pope, MD 11/11/2022, 9:39 AM

## 2022-11-12 ENCOUNTER — Other Ambulatory Visit: Payer: Self-pay

## 2022-11-18 ENCOUNTER — Other Ambulatory Visit: Payer: Self-pay

## 2022-11-19 ENCOUNTER — Encounter (HOSPITAL_COMMUNITY)
Admission: RE | Disposition: A | Discharge status: Home / Self Care | Payer: Self-pay | Source: Home / Self Care | Attending: Cardiology

## 2022-11-19 ENCOUNTER — Ambulatory Visit (HOSPITAL_COMMUNITY)
Admission: RE | Admit: 2022-11-19 | Discharge: 2022-11-19 | Disposition: A | Discharge status: Home / Self Care | Payer: Medicaid Other | Attending: Cardiology | Admitting: Cardiology

## 2022-11-19 ENCOUNTER — Other Ambulatory Visit: Payer: Self-pay

## 2022-11-19 DIAGNOSIS — Z79899 Other long term (current) drug therapy: Secondary | ICD-10-CM | POA: Diagnosis not present

## 2022-11-19 DIAGNOSIS — I4892 Unspecified atrial flutter: Secondary | ICD-10-CM | POA: Diagnosis not present

## 2022-11-19 DIAGNOSIS — Z7901 Long term (current) use of anticoagulants: Secondary | ICD-10-CM | POA: Insufficient documentation

## 2022-11-19 DIAGNOSIS — J449 Chronic obstructive pulmonary disease, unspecified: Secondary | ICD-10-CM | POA: Insufficient documentation

## 2022-11-19 DIAGNOSIS — I130 Hypertensive heart and chronic kidney disease with heart failure and stage 1 through stage 4 chronic kidney disease, or unspecified chronic kidney disease: Secondary | ICD-10-CM | POA: Diagnosis not present

## 2022-11-19 DIAGNOSIS — N183 Chronic kidney disease, stage 3 unspecified: Secondary | ICD-10-CM | POA: Insufficient documentation

## 2022-11-19 DIAGNOSIS — I429 Cardiomyopathy, unspecified: Secondary | ICD-10-CM

## 2022-11-19 DIAGNOSIS — I2510 Atherosclerotic heart disease of native coronary artery without angina pectoris: Secondary | ICD-10-CM | POA: Diagnosis not present

## 2022-11-19 DIAGNOSIS — F17210 Nicotine dependence, cigarettes, uncomplicated: Secondary | ICD-10-CM | POA: Insufficient documentation

## 2022-11-19 DIAGNOSIS — I5020 Unspecified systolic (congestive) heart failure: Secondary | ICD-10-CM

## 2022-11-19 DIAGNOSIS — I5032 Chronic diastolic (congestive) heart failure: Secondary | ICD-10-CM | POA: Insufficient documentation

## 2022-11-19 DIAGNOSIS — E1122 Type 2 diabetes mellitus with diabetic chronic kidney disease: Secondary | ICD-10-CM | POA: Diagnosis not present

## 2022-11-19 HISTORY — PX: RIGHT/LEFT HEART CATH AND CORONARY ANGIOGRAPHY: CATH118266

## 2022-11-19 LAB — POCT I-STAT EG7
Acid-base deficit: 2 mmol/L (ref 0.0–2.0)
Acid-base deficit: 3 mmol/L — ABNORMAL HIGH (ref 0.0–2.0)
Bicarbonate: 22.7 mmol/L (ref 20.0–28.0)
Bicarbonate: 24.1 mmol/L (ref 20.0–28.0)
Calcium, Ion: 1.09 mmol/L — ABNORMAL LOW (ref 1.15–1.40)
Calcium, Ion: 1.19 mmol/L (ref 1.15–1.40)
HCT: 42 % (ref 39.0–52.0)
HCT: 44 % (ref 39.0–52.0)
Hemoglobin: 14.3 g/dL (ref 13.0–17.0)
Hemoglobin: 15 g/dL (ref 13.0–17.0)
O2 Saturation: 53 %
O2 Saturation: 55 %
Potassium: 3.7 mmol/L (ref 3.5–5.1)
Potassium: 4 mmol/L (ref 3.5–5.1)
Sodium: 143 mmol/L (ref 135–145)
Sodium: 145 mmol/L (ref 135–145)
TCO2: 24 mmol/L (ref 22–32)
TCO2: 25 mmol/L (ref 22–32)
pCO2, Ven: 41.1 mmHg — ABNORMAL LOW (ref 44–60)
pCO2, Ven: 42.9 mmHg — ABNORMAL LOW (ref 44–60)
pH, Ven: 7.35 (ref 7.25–7.43)
pH, Ven: 7.358 (ref 7.25–7.43)
pO2, Ven: 29 mmHg — CL (ref 32–45)
pO2, Ven: 31 mmHg — CL (ref 32–45)

## 2022-11-19 LAB — GLUCOSE, CAPILLARY: Glucose-Capillary: 90 mg/dL (ref 70–99)

## 2022-11-19 SURGERY — RIGHT/LEFT HEART CATH AND CORONARY ANGIOGRAPHY
Anesthesia: LOCAL

## 2022-11-19 MED ORDER — CARVEDILOL 12.5 MG PO TABS
12.5000 mg | ORAL_TABLET | Freq: Once | ORAL | Status: AC
  Administered 2022-11-19: 12.5 mg via ORAL
  Filled 2022-11-19: qty 1

## 2022-11-19 MED ORDER — SODIUM CHLORIDE 0.9% FLUSH: 3.0000 mL | Freq: Two times a day (BID) | INTRAVENOUS | Status: DC

## 2022-11-19 MED ORDER — ONDANSETRON HCL 4 MG/2ML IJ SOLN: 4.0000 mg | Freq: Four times a day (QID) | INTRAMUSCULAR | Status: DC | PRN

## 2022-11-19 MED ORDER — ISOSORB DINITRATE-HYDRALAZINE 20-37.5 MG PO TABS
1.0000 | ORAL_TABLET | Freq: Once | ORAL | Status: AC
  Administered 2022-11-19: 1 via ORAL
  Filled 2022-11-19 (×2): qty 1

## 2022-11-19 MED ORDER — FENTANYL CITRATE (PF) 100 MCG/2ML IJ SOLN
INTRAMUSCULAR | Status: DC | PRN
  Administered 2022-11-19: 25 ug via INTRAVENOUS

## 2022-11-19 MED ORDER — LABETALOL HCL 5 MG/ML IV SOLN: 10.0000 mg | INTRAVENOUS | Status: DC | PRN

## 2022-11-19 MED ORDER — APIXABAN 5 MG PO TABS
5.0000 mg | ORAL_TABLET | Freq: Two times a day (BID) | ORAL | 2 refills | Status: DC
  Filled 2022-11-19: qty 60, 30d supply, fill #0

## 2022-11-19 MED ORDER — MIDAZOLAM HCL 2 MG/2ML IJ SOLN
INTRAMUSCULAR | Status: AC
  Filled 2022-11-19: qty 2

## 2022-11-19 MED ORDER — MIDAZOLAM HCL 2 MG/2ML IJ SOLN
INTRAMUSCULAR | Status: DC | PRN
  Administered 2022-11-19: 1 mg via INTRAVENOUS

## 2022-11-19 MED ORDER — LIDOCAINE HCL (PF) 1 % IJ SOLN
INTRAMUSCULAR | Status: AC
  Filled 2022-11-19: qty 30

## 2022-11-19 MED ORDER — HEPARIN SODIUM (PORCINE) 1000 UNIT/ML IJ SOLN
INTRAMUSCULAR | Status: DC | PRN
  Administered 2022-11-19: 5000 [IU] via INTRAVENOUS

## 2022-11-19 MED ORDER — VERAPAMIL HCL 2.5 MG/ML IV SOLN
INTRAVENOUS | Status: AC
  Filled 2022-11-19: qty 2

## 2022-11-19 MED ORDER — HEPARIN (PORCINE) IN NACL 1000-0.9 UT/500ML-% IV SOLN
INTRAVENOUS | Status: DC | PRN
  Administered 2022-11-19 (×2): 500 mL

## 2022-11-19 MED ORDER — IOHEXOL 350 MG/ML SOLN
INTRAVENOUS | Status: DC | PRN
  Administered 2022-11-19: 40 mL

## 2022-11-19 MED ORDER — ACETAMINOPHEN 325 MG PO TABS: 650.0000 mg | ORAL_TABLET | ORAL | Status: DC | PRN

## 2022-11-19 MED ORDER — SODIUM CHLORIDE 0.9 % IV SOLN: 250.0000 mL | INTRAVENOUS | Status: DC | PRN

## 2022-11-19 MED ORDER — SODIUM CHLORIDE 0.9% FLUSH: 3.0000 mL | INTRAVENOUS | Status: DC | PRN

## 2022-11-19 MED ORDER — VERAPAMIL HCL 2.5 MG/ML IV SOLN
INTRAVENOUS | Status: DC | PRN
  Administered 2022-11-19: 10 mL via INTRA_ARTERIAL

## 2022-11-19 MED ORDER — ASPIRIN 81 MG PO CHEW
81.0000 mg | CHEWABLE_TABLET | ORAL | Status: DC
  Filled 2022-11-19: qty 1

## 2022-11-19 MED ORDER — SODIUM CHLORIDE 0.9 % IV SOLN: INTRAVENOUS | Status: DC

## 2022-11-19 MED ORDER — FENTANYL CITRATE (PF) 100 MCG/2ML IJ SOLN
INTRAMUSCULAR | Status: AC
  Filled 2022-11-19: qty 2

## 2022-11-19 MED ORDER — HYDRALAZINE HCL 20 MG/ML IJ SOLN: 10.0000 mg | INTRAMUSCULAR | Status: DC | PRN

## 2022-11-19 MED ORDER — HEPARIN SODIUM (PORCINE) 1000 UNIT/ML IJ SOLN
INTRAMUSCULAR | Status: AC
  Filled 2022-11-19: qty 10

## 2022-11-19 MED ORDER — ASPIRIN 81 MG PO CHEW
81.0000 mg | CHEWABLE_TABLET | ORAL | Status: AC
  Administered 2022-11-19: 81 mg via ORAL

## 2022-11-19 MED ORDER — LIDOCAINE HCL (PF) 1 % IJ SOLN
INTRAMUSCULAR | Status: DC | PRN
  Administered 2022-11-19: 4 mL

## 2022-11-19 SURGICAL SUPPLY — 12 items
BAND CMPR LRG ZPHR (HEMOSTASIS) ×1
BAND ZEPHYR COMPRESS 30 LONG (HEMOSTASIS) IMPLANT
CATH 5FR JL3.5 JR4 ANG PIG MP (CATHETERS) IMPLANT
CATH BALLN WEDGE 5F 110CM (CATHETERS) IMPLANT
GLIDESHEATH SLEND SS 6F .021 (SHEATH) IMPLANT
GUIDEWIRE .025 260CM (WIRE) IMPLANT
GUIDEWIRE INQWIRE 1.5J.035X260 (WIRE) IMPLANT
INQWIRE 1.5J .035X260CM (WIRE) ×1
KIT HEART LEFT (KITS) ×1 IMPLANT
PACK CARDIAC CATHETERIZATION (CUSTOM PROCEDURE TRAY) ×1 IMPLANT
SHEATH GLIDE SLENDER 4/5FR (SHEATH) IMPLANT
TRANSDUCER W/STOPCOCK (MISCELLANEOUS) ×1 IMPLANT

## 2022-11-19 NOTE — Progress Notes (Signed)
TR BAND REMOVAL  LOCATION:    right radial  DEFLATED PER PROTOCOL:    Yes.    TIME BAND OFF / DRESSING APPLIED:    1039 gauze dressing applied    SITE UPON ARRIVAL:    Level 0  SITE AFTER BAND REMOVAL:    Level 0  CIRCULATION SENSATION AND MOVEMENT:    Within Normal Limits   Yes.    COMMENTS:   no new issues noted

## 2022-11-19 NOTE — Discharge Instructions (Addendum)
Restart Eliquis tomorrow morning (Saturday)       Radial Site Care  This sheet gives you information about how to care for yourself after your procedure. Your health care provider may also give you more specific instructions. If you have problems or questions, contact your health care provider. What can I expect after the procedure? After the procedure, it is common to have: Bruising and tenderness at the catheter insertion area. Follow these instructions at home: Medicines Take over-the-counter and prescription medicines only as told by your health care provider. Insertion site care Follow instructions from your health care provider about how to take care of your insertion site. Make sure you: Wash your hands with soap and water before you remove your bandage (dressing). If soap and water are not available, use hand sanitizer. May remove dressing in 24 hours. Check your insertion site every day for signs of infection. Check for: Redness, swelling, or pain. Fluid or blood. Pus or a bad smell. Warmth. Do no take baths, swim, or use a hot tub for 5 days. You may shower 24-48 hours after the procedure. Remove the dressing and gently wash the site with plain soap and water. Pat the area dry with a clean towel. Do not rub the site. That could cause bleeding. Do not apply powder or lotion to the site. Activity  For 24 hours after the procedure, or as directed by your health care provider: Do not flex or bend the affected arm. Do not push or pull heavy objects with the affected arm. Do not drive yourself home from the hospital or clinic. You may drive 24 hours after the procedure. Do not operate machinery or power tools. KEEP ARM ELEVATED THE REMAINDER OF THE DAY. Do not push, pull or lift anything that is heavier than 10 lb for 5 days. Ask your health care provider when it is okay to: Return to work or school. Resume usual physical activities or sports. Resume sexual  activity. General instructions If the catheter site starts to bleed, raise your arm and put firm pressure on the site. If the bleeding does not stop, get help right away. This is a medical emergency. DRINK PLENTY OF FLUIDS FOR THE NEXT 2-3 DAYS. No alcohol consumption for 24 hours after receiving sedation. If you went home on the same day as your procedure, a responsible adult should be with you for the first 24 hours after you arrive home. Keep all follow-up visits as told by your health care provider. This is important. Contact a health care provider if: You have a fever. You have redness, swelling, or yellow drainage around your insertion site. Get help right away if: You have unusual pain at the radial site. The catheter insertion area swells very fast. The insertion area is bleeding, and the bleeding does not stop when you hold steady pressure on the area. Your arm or hand becomes pale, cool, tingly, or numb. These symptoms may represent a serious problem that is an emergency. Do not wait to see if the symptoms will go away. Get medical help right away. Call your local emergency services (911 in the [place] Do not drive yourself to the hospital. Summary After the procedure, it is common to have bruising and tenderness at the site. Follow instructions from your health care provider about how to take care of your radial site wound. Check the wound every day for signs of infection.  This information is not intended to replace advice given to you by  your health care provider. Make sure you discuss any questions you have with your health care provider. Document Revised: 07/06/2017 Document Reviewed: 07/06/2017 Elsevier Patient Education  2020 [employer]    Brachial Site Care   This sheet gives you information about how to care for yourself after your procedure. Your health care provider may also give you more specific instructions. If you have problems or questions, contact your health  care provider. What can I expect after the procedure? After the procedure, it is common to have: Bruising and tenderness at the catheter insertion area. Follow these instructions at home:  Insertion site care Follow instructions from your health care provider about how to take care of your insertion site. Make sure you: Wash your hands with soap and water before you change your bandage (dressing). If soap and water are not available, use hand sanitizer. Remove your dressing as told by your health care provider. In 24 hours Check your insertion site every day for signs of infection. Check for: Redness, swelling, or pain. Pus or a bad smell. Warmth. You may shower 24-48 hours after the procedure. Do not apply powder or lotion to the site.  Activity For 24 hours after the procedure, or as directed by your health care provider: Do not push or pull heavy objects with the affected arm. Do not drive yourself home from the hospital or clinic. You may drive 24 hours after the procedure unless your health care provider tells you not to. Do not lift anything that is heavier than 10 lb (4.5 kg), or the limit that you are told, until your health care provider says that it is safe.  For 24 hours

## 2022-11-19 NOTE — Interval H&P Note (Signed)
History and Physical Interval Note:  11/19/2022 8:03 AM  Chris [name]  has presented today for surgery, with the diagnosis of heart failure.  The various methods of treatment have been discussed with the patient and family. After consideration of risks, benefits and other options for treatment, the patient has consented to  Procedure(s): RIGHT/LEFT HEART CATH AND CORONARY ANGIOGRAPHY (N/A) as a surgical intervention.  The patient's history has been reviewed, patient examined, no change in status, stable for surgery.  I have reviewed the patient's chart and labs.  Questions were answered to the patient's satisfaction.     Myrle Dues [employer]

## 2022-11-22 ENCOUNTER — Encounter (HOSPITAL_COMMUNITY): Payer: Self-pay | Admitting: Cardiology

## 2022-11-23 ENCOUNTER — Other Ambulatory Visit: Payer: Medicaid Other | Admitting: *Deleted

## 2022-11-23 NOTE — Patient Outreach (Signed)
Care Coordination  11/23/2022  RENAUD CELLI 08-Dec-1960 [id number]   Successful telephone outreach with Mr. Macintyre today. However, he shared with RNCM that he was busy and would like to reschedule. A new appointment was scheduled for 11/29/22 at 9 am.   Estanislado Emms RN, BSN [place]  Managed [facility] RN Care Coordinator [phone]

## 2022-11-26 ENCOUNTER — Other Ambulatory Visit: Payer: Self-pay

## 2022-11-29 ENCOUNTER — Other Ambulatory Visit: Payer: Self-pay

## 2022-11-29 ENCOUNTER — Other Ambulatory Visit: Payer: Medicaid Other | Admitting: *Deleted

## 2022-11-29 NOTE — Patient Instructions (Signed)
Visit Information  [name]  - as a part of your Medicaid benefit, you are eligible for care management and care coordination services at no cost or copay. I was unable to reach you by phone today but would be happy to help you with your health related needs. Please feel free to call me @ [phone].   A member of the Managed Medicaid care management team will reach out to you again over the next 7 days.   Ralyn Stlaurent RN, BSN [place]  Managed Medicaid RN Care Coordinator [phone]   

## 2022-11-29 NOTE — Patient Outreach (Signed)
  Medicaid Managed Care   Unsuccessful Attempt Note   11/29/2022 Name: Chris [name] MRN: [id number] DOB: [birth year]  Referred by: [name], MD Reason for referral : High Risk Managed Medicaid (Unsuccessful RNCM follow up outreach)   An unsuccessful telephone outreach was attempted today. The patient was referred to the case management team for assistance with care management and care coordination.    Follow Up Plan: The Managed Medicaid care management team will reach out to the patient again over the next 7 days.    Estanislado Emms RN, BSN [place]  Managed [facility] RN Care Coordinator [phone]

## 2022-12-03 ENCOUNTER — Other Ambulatory Visit: Payer: Self-pay

## 2022-12-07 ENCOUNTER — Ambulatory Visit (INDEPENDENT_AMBULATORY_CARE_PROVIDER_SITE_OTHER): Payer: Medicaid Other | Admitting: Mental Health

## 2022-12-07 ENCOUNTER — Encounter: Payer: Self-pay | Admitting: *Deleted

## 2022-12-07 ENCOUNTER — Encounter (HOSPITAL_COMMUNITY): Payer: Self-pay

## 2022-12-07 ENCOUNTER — Other Ambulatory Visit: Payer: Medicaid Other | Admitting: *Deleted

## 2022-12-07 DIAGNOSIS — F4310 Post-traumatic stress disorder, unspecified: Secondary | ICD-10-CM

## 2022-12-07 DIAGNOSIS — F331 Major depressive disorder, recurrent, moderate: Secondary | ICD-10-CM
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
## 2022-12-07 NOTE — Patient Outreach (Signed)
Medicaid Managed Care   Nurse Care Manager Note  12/07/2022 Name:  Chris [name] MRN:  [id number] DOB:  [birth year]  Chris [name] is an 62 y.o. year old male who is a primary patient of Chris Matar, MD.  The [facility] Managed Care Coordination team was consulted for assistance with:    CHF COPD pain  Chris [name] was given information about Medicaid Managed Care Coordination team services today. Chris [name] Patient agreed to services and verbal consent obtained.  Engaged with patient by telephone for follow up visit in response to provider referral for case management and/or care coordination services.   Assessments/Interventions:  Review of past medical history, allergies, medications, health status, including review of consultants reports, laboratory and other test data, was performed as part of comprehensive evaluation and provision of chronic care management services.  SDOH (Social Determinants of Health) assessments and interventions performed: SDOH Interventions    Flowsheet Row Patient Outreach Telephone from 11/10/2022 in [place] POPULATION HEALTH DEPARTMENT Patient Outreach Telephone from 10/25/2022 in [place] POPULATION HEALTH DEPARTMENT Patient Outreach Telephone from 10/20/2022 in [place] POPULATION HEALTH DEPARTMENT Patient Outreach Telephone from 08/27/2022 in [place] POPULATION HEALTH DEPARTMENT Patient Outreach Telephone from 08/19/2022 in [place] POPULATION HEALTH DEPARTMENT Patient Outreach Telephone from 07/27/2022 in  POPULATION HEALTH DEPARTMENT  SDOH Interventions        Transportation Interventions -- -- Payor Benefit -- -- --  Stress Interventions Offered [employer], Provide Counseling  [I am feeling better since I seen my doctor] Provide Counseling, Offered [employer] -- [employer], Provide Counseling Offered [employer], Provide Counseling Provide  Counseling, Offered [facility]       Care Plan  No Known Allergies  Medications Reviewed Today     Reviewed by Chris Dach, RN (Registered Nurse) on 12/07/22 at 1633  Med List Status: <None>   Medication Order Taking? Sig Documenting Provider Last Dose Status Informant  Accu-Chek Softclix Lancets lancets [id number] No Use as instructed  Patient not taking: Reported on 12/07/2022   Chris Matar, MD Not Taking Active Self  acetaminophen (TYLENOL) 500 MG tablet [id number] No Take 500-1,000 mg by mouth every 6 (six) hours as needed (pain.).  Patient not taking: Reported on 12/07/2022   [provider] Not Taking Active Self  albuterol (PROVENTIL) (2.5 MG/3ML) 0.083% nebulizer solution [id number]  Take 3 mLs (2.5 mg total) by nebulization every 4 (four) hours as needed for wheezing or shortness of breath. Chris Matar, MD  Expired 11/19/22 2359 Self  albuterol (VENTOLIN HFA) 108 (90 Base) MCG/ACT inhaler [id number] No Inhale 2 puffs into the lungs every 6 (six) hours as needed for wheezing or shortness of breath.  Patient not taking: Reported on 12/07/2022   Chris Matar, MD Not Taking Active Self           Med Note Chris [name] Nov 15, 2022  1:08 PM)    allopurinol (ZYLOPRIM) 100 MG tablet [id number] No TAKE 2 TABLETS (200 MG TOTAL) BY MOUTH DAILY.  Patient not taking: Reported on 12/07/2022   Chris Matar, MD Not Taking Active Self  apixaban (ELIQUIS) 5 MG TABS tablet [id number] No Take 1 tablet (5 mg total) by mouth 2 (two) times daily.  Patient not taking: Reported on 12/07/2022   Chris Morale, MD Not Taking Active   atorvastatin (LIPITOR) 40 MG tablet [id number] No Take 1 tablet (40 mg total)  by mouth daily.  Patient not taking: Reported on 12/07/2022   Chris Freeze, FNP Not Taking Active Self  Blood Glucose Monitoring Suppl (ACCU-CHEK GUIDE) w/Device KIT [id number] No Use as directed  Patient not taking: Reported on  12/07/2022   Chris Matar, MD Not Taking Active Self  budesonide-formoterol [facility]) 160-4.5 MCG/ACT inhaler [id number] No Inhale 2 puffs into the lungs 2 (two) times daily.  Patient not taking: Reported on 12/07/2022   Chris Matar, MD Not Taking Active Self  carvedilol (COREG) 12.5 MG tablet [id number] No Take 1 tablet (12.5 mg total) by mouth 2 (two) times daily.  Patient not taking: Reported on 12/07/2022   Chris Morale, MD Not Taking Active Self  colchicine 0.6 MG tablet [id number] No Take 0.5 tablets (0.3 mg total) by mouth three times a week.  Patient not taking: Reported on 12/07/2022   Chris Matar, MD Not Taking Active Self           Med Note Chris [name] Nov 15, 2022  1:09 PM)    dapagliflozin propanediol (FARXIGA) 10 MG TABS tablet [id number] No Take 1 tablet (10 mg total) by mouth daily.  Patient not taking: Reported on 12/07/2022   Chris Freeze, FNP Not Taking Active Self  DULoxetine (CYMBALTA) 30 MG capsule [id number]  Take 1 capsule (30 mg total) by mouth daily.  Patient taking differently: Take 20 mg by mouth daily.   Chris Pope, MD  Expired 11/29/22 2359 Self  glucose blood (ACCU-CHEK GUIDE) test strip [id number] No Use as directed to check blood sugar 1-2 times a day  Patient not taking: Reported on 12/07/2022   Chris Matar, MD Not Taking Active Self  isosorbide-hydrALAZINE (BIDIL) 20-37.5 MG tablet [id number] No Take 1 tablet by mouth 3 (three) times daily.  Patient not taking: Reported on 12/07/2022   Chris Odea, MD Not Taking Active Self    Discontinued 07/11/20 [phone] (Discontinued by provider)   montelukast (SINGULAIR) 10 MG tablet [id number] No Take 1 tablet (10 mg total) by mouth at bedtime.  Patient not taking: Reported on 12/07/2022   Chris Matar, MD Not Taking Active Self  oxyCODONE-acetaminophen (PERCOCET) 5-325 MG tablet [id number] No Take 1 tablet by mouth every 4 (four) hours as needed for severe pain.  Patient  not taking: Reported on 09/28/2022   Chris Antis, MD Not Taking Active Self  potassium chloride (KLOR-CON M) 10 MEQ tablet [id number] No Take 2 tablets (20 mEq total) by mouth daily.  Patient not taking: Reported on 12/07/2022   Chris Morale, MD Not Taking Active Self  Tiotropium Bromide Monohydrate (SPIRIVA RESPIMAT) 2.5 MCG/ACT AERS [id number] No Inhale 2 puffs into the lungs daily.  Patient not taking: Reported on 11/15/2022   Chris Matar, MD Not Taking Active Self  torsemide (DEMADEX) 20 MG tablet [id number] No Take 20-40 mg by mouth See admin instructions. Take 20 mg every other day 40 mg alternate  Patient not taking: Reported on 12/07/2022   [provider] Not Taking Active Self  traZODone (DESYREL) 50 MG tablet [id number] Yes Take 1-2 tablets (50-100 mg total) by mouth at bedtime. Chris Pope, MD Taking Active Self           Med Note Chris [name] Nov 15, 2022  1:09 PM)              Patient Active Problem List   Diagnosis Date Noted   Post traumatic stress disorder 09/23/2022  MDD (major depressive disorder), recurrent episode, moderate (HCC) 08/26/2022   Stage 3b chronic kidney disease (HCC) 08/09/2022   Hypertrophic cardiomyopathy (HCC) 08/09/2022   History of renal cell cancer 08/09/2022   Chronic heart failure with preserved ejection fraction (HFpEF) (HCC) 07/13/2022   Agitation 04/23/2022   RSV (respiratory syncytial virus pneumonia) 04/22/2022   Obesity (BMI 30-39.9) 04/21/2022   COPD exacerbation (HCC) 04/19/2022   Dyslipidemia 04/19/2022   Gout 04/19/2022   Right kidney mass 05/14/2021   CHF exacerbation (HCC) 04/14/2021   Chest pain 04/14/2021   Syncope 04/14/2021   Left-sided weakness 10/28/2020   Typical atrial flutter (HCC)    CHF (congestive heart failure) (HCC) 07/04/2020   Acute exacerbation of CHF (congestive heart failure) (HCC) 06/16/2020   Influenza vaccine refused 05/06/2020   Acute decompensated heart failure (HCC)  05/04/2020   Illiteracy 05/04/2020   Type 2 diabetes mellitus with stage 3 chronic kidney disease (HCC) 12/25/2019   Elevated troponin I level 10/26/2019   History of gout 02/01/2019   Seasonal allergic rhinitis due to pollen 02/01/2019   Tobacco dependence 11/30/2018   Microscopic hematuria 11/30/2018   Depression 11/30/2018   Difficulty controlling anger 11/30/2018   COPD (chronic obstructive pulmonary disease) (HCC)    CKD (chronic kidney disease) stage 3, GFR 30-59 ml/min (HCC) 08/10/2018   Recurrent epistaxis 04/21/2018   Mixed hyperlipidemia 07/28/2017   Essential hypertension 07/28/2017   Chronic systolic heart failure (HCC) 10/25/2014   Cocaine abuse (HCC) 02/20/2013   Cannabis abuse 02/20/2013   Back pain, chronic 02/20/2013    Conditions to be addressed/monitored per PCP order:  CHF, COPD, and pain  Care Plan : RN Care Manager Plan of Care  Updates made by Chris Dach, RN since 12/07/2022 12:00 AM     Problem: Health Management Needs Related to Heart Failure and Chronic Pain   Priority: High  Note:   Current Barriers:    RNCM Clinical Goal(s):  Patient will take all medications exactly as prescribed and will call provider for medication related questions as evidenced by medication review and patient interview demonstrate Ongoing adherence to prescribed treatment plan for CHF as evidenced by medication review continue to work with RN Care Manager to address care management and care coordination needs related to  CHF as evidenced by adherence to CM Team Scheduled appointments work with Child psychotherapist to address  related to the management of Limited social support, Transportation, and Lack of essential utilities - light/power* related to the management of CHF as evidenced by review of EMR and patient or Child psychotherapist report through collaboration with Medical illustrator, provider, and care team.   Interventions: Collaboration with LCSW/[name]/Care Guide and Heart Failure  RN Inter-disciplinary care team collaboration (see longitudinal plan of care) Evaluation of current treatment plan related to  self management and patient's adherence to plan as established by provider   Heart Failure Interventions:  (Status:  New goal.) Long Term Goal Discussed importance of daily weight and advised patient to weigh and record daily Reviewed role of diuretics in prevention of fluid overload and management of heart failure; Discussed the importance of keeping all appointments with provider Assessed social determinant of health barriers  Discussed importance of self assessment reporting of swelling, increased shortness of breath, chest pain, dizziness, or other new or worsened symptom  Pain Interventions:  (Status:  New goal.) Short Term Goal Pain assessment performed Medications reviewed Reviewed provider established plan for pain management Discussed importance of adherence to all scheduled medical appointments  Counseled on the importance of reporting any/all new or changed pain symptoms or management strategies to pain management provider Reviewed with patient prescribed pharmacological and nonpharmacological pain relief strategies Assessed social determinant of health barriers Advised patient that the CM team would discuss pain management needs with provider and assist with connection to pain management provider  Patient Goals/Self-Care Activities: Take all medications as prescribed Attend all scheduled provider appointments Call provider office for new concerns or questions  Work with the social worker to address care coordination needs and will continue to work with the clinical team to address health care and disease management related needs call 1-800-273-TALK (toll free, 24 hour hotline) if experiencing a Mental Health or Behavioral Health Crisis   Follow Up Plan:  Telephone follow up appointment with care management team member scheduled for:  07/27/22 10am       Long-Range Goal: Independent self health management of Heart Failure and Chronic Pain   Start Date: 07/23/2022  Expected End Date: 10/21/2022  Recent Progress: On track  Priority: High  Note:   Current Barriers:  Knowledge Deficits related to plan of care for management of CHF and Chronic Pain Care Coordination needs related to Transportation  Non-adherence to prescribed medication regimen Difficulty obtaining medications  RNCM Clinical Goal(s):  Patient will take all medications exactly as prescribed and will call provider for medication related questions as evidenced by medication review and patient interview demonstrate Ongoing adherence to prescribed treatment plan for CHF as evidenced by medication review continue to work with RN Care Manager to address care management and care coordination needs related to  CHF as evidenced by adherence to CM Team Scheduled appointments work with Child psychotherapist to address  related to the management of Limited social support, Transportation, and Lack of essential utilities - light/power* related to the management of CHF as evidenced by review of EMR and patient or Child psychotherapist report through collaboration with Medical illustrator, provider, and care team.   Interventions: Inter-disciplinary care team collaboration (see longitudinal plan of care) Evaluation of current treatment plan related to  self management and patient's adherence to plan as established by provider Collaborated with [facility] [phone], assisted patient with setting up new charge account Collaborated with Providers to request new prescriptions be sent to [facility]([place] having IT issues and unable to view patient profile) Reviewed upcoming appointments including: 12/13/22 with Dr. Nelly Laurence, 12/14/22 with PCP and 12/22/22 with Dr. Shirlee Latch   COPD Interventions:  (Status:   Goal not addressed ) Long Term Goal Provided patient with basic written and verbal COPD education on  self care/management/and exacerbation prevention Provided education about and advised patient to utilize infection prevention strategies to reduce risk of respiratory infection Discussed the importance of adequate rest and management of fatigue with COPD Assessed social determinant of health barriers Advised patient to contact [phone] to schedule PFT and [phone] to schedule follow up with [facility] Pharmacy will request refills for inhalers   Heart Failure Interventions:  (Status: goal progressing) Long Term Goal Discussed importance of daily weight and advised patient to weigh and record daily Reviewed role of diuretics in prevention of fluid overload and management of heart failure; Discussed the importance of keeping all appointments with provider Assessed social determinant of health barriers  Discussed importance of self assessment reporting of swelling, increased shortness of breath, chest pain, dizziness, or other new or worsened symptom Reviewed upcoming appointment with Dr. Nelly Laurence on 12/13/22, PCP on 12/14/22 and Dr. Shirlee Latch on 12/22/22  Pain  Interventions:  (Status:  stable, not addressed today) Long Term Goal Pain assessment performed Medications reviewed: discussed Cymbalta-patient needs to pick up new dose as prescribed by Psychiatry Reviewed provider established plan for pain management Discussed importance of adherence to all scheduled medical appointments Counseled on the importance of reporting any/all new or changed pain symptoms or management strategies to pain management provider Reviewed with patient prescribed pharmacological and nonpharmacological pain relief strategies Assessed social determinant of health barriers Discussed the benefits of cymbalta  Patient Goals/Self-Care Activities: Take all medications as prescribed Attend all scheduled provider appointments Call provider office for new concerns or questions  Work with the social worker to address care  coordination needs and will continue to work with the clinical team to address health care and disease management related needs call 1-800-273-TALK (toll free, 24 hour hotline) if experiencing a Mental Health or Behavioral Health Crisis   Follow Up Plan:  Telephone follow up appointment with care management team member scheduled for:  12/08/22 @ 12:30pm      Follow Up:  Patient agrees to Care Plan and Follow-up.  Plan: The Managed Medicaid care management team will reach out to the patient again over the next 1 days.  Date/time of next scheduled RN care management/care coordination outreach:  06/09/23 @ 12:30 pm  Estanislado Emms RN, BSN [place]  Managed [facility] RN Care Coordinator [phone]

## 2022-12-07 NOTE — Patient Instructions (Signed)
Visit Information  Chris [name] was given information about Medicaid Managed Care team care coordination services as a part of their Healthy [facility] Medicaid benefit. [name] verbally consented to engagement with the [facility] Managed Care team.   If you are experiencing a medical emergency, please call 911 or report to your local emergency department or urgent care.   If you have a non-emergency medical problem during routine business hours, please contact your provider's office and ask to speak with a nurse.   For questions related to your Healthy [facility] health plan, please call: [phone] or visit the homepage here: [email or web address]  If you would like to schedule transportation through your Healthy [facility] plan, please call the following number at least 2 days in advance of your appointment: [phone]  For information about your ride after you set it up, call Ride Assist at [phone]. Use this number to activate a Will Call pickup, or if your transportation is late for a scheduled pickup. Use this number, too, if you need to make a change or cancel a previously scheduled reservation.  If you need transportation services right away, call [phone]. The after-hours call center is staffed 24 hours to handle ride assistance and urgent reservation requests (including discharges) 365 days a year. Urgent trips include sick visits, hospital discharge requests and life-sustaining treatment.  Call the [facility] Line at [phone], at any time, 24 hours a day, 7 days a week. If you are in danger or need immediate medical attention call 911.  If you would like help to quit smoking, call 1-800-QUIT-NOW (707 696 5300) OR Espaol: 1-855-Djelo-Ya (6-606-301-6010) o para ms informacin haga clic aqu or Text READY to 932-355 to register via text  Mr. [name],    Patient verbalizes understanding of instructions and care  plan provided today and agrees to view in MyChart. Active MyChart status and patient understanding of how to access instructions and care plan via MyChart confirmed with patient.     Telephone follow up appointment with Managed Medicaid care management team member scheduled for:12/08/22 @ 12:30 pm  Estanislado Emms RN, BSN   Managed [facility] RN Care Coordinator [phone]   Following is a copy of your plan of care:  Care Plan : RN Care Manager Plan of Care  Updates made by [name], RN since 12/07/2022 12:00 AM     Problem: Health Management Needs Related to Heart Failure and Chronic Pain   Priority: High  Note:   Current Barriers:    RNCM Clinical Goal(s):  Patient will take all medications exactly as prescribed and will call provider for medication related questions as evidenced by medication review and patient interview demonstrate Ongoing adherence to prescribed treatment plan for CHF as evidenced by medication review continue to work with RN Care Manager to address care management and care coordination needs related to  CHF as evidenced by adherence to CM Team Scheduled appointments work with Child psychotherapist to address  related to the management of Limited social support, Transportation, and Lack of essential utilities - light/power* related to the management of CHF as evidenced by review of EMR and patient or Child psychotherapist report through collaboration with Medical illustrator, provider, and care team.   Interventions: Collaboration with LCSW/[name]/Care Guide and Heart Failure RN Inter-disciplinary care team collaboration (see longitudinal plan of care) Evaluation of current treatment plan related to  self management and patient's adherence to plan as established by provider   Heart Failure Interventions:  (Status:  New  goal.) Long Term Goal Discussed importance of daily weight and advised patient to weigh and record daily Reviewed role of diuretics in prevention of  fluid overload and management of heart failure; Discussed the importance of keeping all appointments with provider Assessed social determinant of health barriers  Discussed importance of self assessment reporting of swelling, increased shortness of breath, chest pain, dizziness, or other new or worsened symptom  Pain Interventions:  (Status:  New goal.) Short Term Goal Pain assessment performed Medications reviewed Reviewed provider established plan for pain management Discussed importance of adherence to all scheduled medical appointments Counseled on the importance of reporting any/all new or changed pain symptoms or management strategies to pain management provider Reviewed with patient prescribed pharmacological and nonpharmacological pain relief strategies Assessed social determinant of health barriers Advised patient that the CM team would discuss pain management needs with provider and assist with connection to pain management provider  Patient Goals/Self-Care Activities: Take all medications as prescribed Attend all scheduled provider appointments Call provider office for new concerns or questions  Work with the social worker to address care coordination needs and will continue to work with the clinical team to address health care and disease management related needs call 1-800-273-TALK (toll free, 24 hour hotline) if experiencing a Mental Health or Behavioral Health Crisis   Follow Up Plan:  Telephone follow up appointment with care management team member scheduled for:  07/27/22 10am      Long-Range Goal: Independent self health management of Heart Failure and Chronic Pain   Start Date: 07/23/2022  Expected End Date: 10/21/2022  Recent Progress: On track  Priority: High  Note:   Current Barriers:  Knowledge Deficits related to plan of care for management of CHF and Chronic Pain Care Coordination needs related to Transportation  Non-adherence to prescribed medication  regimen Difficulty obtaining medications  RNCM Clinical Goal(s):  Patient will take all medications exactly as prescribed and will call provider for medication related questions as evidenced by medication review and patient interview demonstrate Ongoing adherence to prescribed treatment plan for CHF as evidenced by medication review continue to work with RN Care Manager to address care management and care coordination needs related to  CHF as evidenced by adherence to CM Team Scheduled appointments work with Child psychotherapist to address  related to the management of Limited social support, Transportation, and Lack of essential utilities - light/power* related to the management of CHF as evidenced by review of EMR and patient or Child psychotherapist report through collaboration with Medical illustrator, provider, and care team.   Interventions: Inter-disciplinary care team collaboration (see longitudinal plan of care) Evaluation of current treatment plan related to  self management and patient's adherence to plan as established by provider Collaborated with [employer] [phone], assisted patient with setting up new charge account Collaborated with Providers to request new prescriptions be sent to [facility]([place] having IT issues and unable to view patient profile) Reviewed upcoming appointments including: 12/13/22 with Dr. Nelly Laurence, 12/14/22 with PCP and 12/22/22 with Dr. Shirlee Latch   COPD Interventions:  (Status:   Goal not addressed ) Long Term Goal Provided patient with basic written and verbal COPD education on self care/management/and exacerbation prevention Provided education about and advised patient to utilize infection prevention strategies to reduce risk of respiratory infection Discussed the importance of adequate rest and management of fatigue with COPD Assessed social determinant of health barriers Advised patient to contact [phone] to schedule PFT and [phone] to schedule follow  up with  [facility] Pharmacy will request refills for inhalers   Heart Failure Interventions:  (Status: goal progressing) Long Term Goal Discussed importance of daily weight and advised patient to weigh and record daily Reviewed role of diuretics in prevention of fluid overload and management of heart failure; Discussed the importance of keeping all appointments with provider Assessed social determinant of health barriers  Discussed importance of self assessment reporting of swelling, increased shortness of breath, chest pain, dizziness, or other new or worsened symptom Reviewed upcoming appointment with Dr. Nelly Laurence on 12/13/22, PCP on 12/14/22 and Dr. Shirlee Latch on 12/22/22  Pain Interventions:  (Status:  stable, not addressed today) Long Term Goal Pain assessment performed Medications reviewed: discussed Cymbalta-patient needs to pick up new dose as prescribed by Psychiatry Reviewed provider established plan for pain management Discussed importance of adherence to all scheduled medical appointments Counseled on the importance of reporting any/all new or changed pain symptoms or management strategies to pain management provider Reviewed with patient prescribed pharmacological and nonpharmacological pain relief strategies Assessed social determinant of health barriers Discussed the benefits of cymbalta  Patient Goals/Self-Care Activities: Take all medications as prescribed Attend all scheduled provider appointments Call provider office for new concerns or questions  Work with the social worker to address care coordination needs and will continue to work with the clinical team to address health care and disease management related needs call 1-800-273-TALK (toll free, 24 hour hotline) if experiencing a Mental Health or Behavioral Health Crisis   Follow Up Plan:  Telephone follow up appointment with care management team member scheduled for:  12/08/22 @ 12:30pm

## 2022-12-08 ENCOUNTER — Other Ambulatory Visit (HOSPITAL_COMMUNITY): Payer: Self-pay

## 2022-12-08 ENCOUNTER — Other Ambulatory Visit: Payer: Medicaid Other | Admitting: *Deleted

## 2022-12-08 ENCOUNTER — Telehealth: Payer: Self-pay | Admitting: Internal Medicine

## 2022-12-08 DIAGNOSIS — J449 Chronic obstructive pulmonary disease, unspecified: Secondary | ICD-10-CM

## 2022-12-08 DIAGNOSIS — I10 Essential (primary) hypertension: Secondary | ICD-10-CM

## 2022-12-08 DIAGNOSIS — F331 Major depressive disorder, recurrent, moderate: Secondary | ICD-10-CM

## 2022-12-08 DIAGNOSIS — I5032 Chronic diastolic (congestive) heart failure: Secondary | ICD-10-CM

## 2022-12-08 DIAGNOSIS — M10072 Idiopathic gout, left ankle and foot: Secondary | ICD-10-CM

## 2022-12-08 MED ORDER — SPIRIVA RESPIMAT 2.5 MCG/ACT IN AERS
2.0000 | INHALATION_SPRAY | Freq: Every day | RESPIRATORY_TRACT | 6 refills | Status: DC
Start: 2022-12-08 — End: 2023-07-26

## 2022-12-08 MED ORDER — CARVEDILOL 12.5 MG PO TABS: 12.5000 mg | ORAL_TABLET | Freq: Two times a day (BID) | ORAL | 3 refills | Status: DC

## 2022-12-08 MED ORDER — DAPAGLIFLOZIN PROPANEDIOL 10 MG PO TABS: 10.0000 mg | ORAL_TABLET | Freq: Every day | ORAL | 3 refills | Status: DC

## 2022-12-08 MED ORDER — ATORVASTATIN CALCIUM 40 MG PO TABS
40.0000 mg | ORAL_TABLET | Freq: Every day | ORAL | 3 refills | Status: DC
Start: 2022-12-08 — End: 2023-02-08

## 2022-12-08 MED ORDER — COLCHICINE 0.6 MG PO TABS: 0.3000 mg | ORAL_TABLET | ORAL | 1 refills | Status: DC

## 2022-12-08 MED ORDER — BUDESONIDE-FORMOTEROL FUMARATE 160-4.5 MCG/ACT IN AERO
2.0000 | INHALATION_SPRAY | Freq: Two times a day (BID) | RESPIRATORY_TRACT | 6 refills | Status: DC
Start: 2022-12-08 — End: 2023-03-28

## 2022-12-08 MED ORDER — TRAZODONE HCL 50 MG PO TABS
50.00 mg | ORAL_TABLET | Freq: Every day | ORAL | 1 refills | Status: DC
Start: 2022-12-08 — End: 2022-12-17

## 2022-12-08 MED ORDER — ALBUTEROL SULFATE HFA 108 (90 BASE) MCG/ACT IN AERS: 2.0000 | INHALATION_SPRAY | Freq: Four times a day (QID) | RESPIRATORY_TRACT | 6 refills | Status: DC | PRN

## 2022-12-08 MED ORDER — TORSEMIDE 20 MG PO TABS: ORAL_TABLET | ORAL | 3 refills | Status: DC

## 2022-12-08 MED ORDER — APIXABAN 5 MG PO TABS: 5.0000 mg | ORAL_TABLET | Freq: Two times a day (BID) | ORAL | 11 refills | Status: DC

## 2022-12-08 MED ORDER — POTASSIUM CHLORIDE CRYS ER 10 MEQ PO TBCR
20.0000 meq | EXTENDED_RELEASE_TABLET | Freq: Every day | ORAL | 3 refills | Status: DC
Start: 2022-12-08 — End: 2023-07-26

## 2022-12-08 NOTE — Telephone Encounter (Signed)
-----   Message from [name], RN sent at 12/07/2022  4:33 PM EDT ----- Regarding: Medications Hi Dr. Laural Benes,  Chris [name] is currently out of all of his medications. I have arranged a charge account with [facility]. Can you send updated prescriptions to [facility]?  Estanislado Emms RN, BSN [place]  Managed [facility] RN Care Coordinator [phone]

## 2022-12-08 NOTE — Patient Outreach (Signed)
Medicaid Managed Care   Nurse Care Manager Note  12/08/2022 Name:  Chris [name] MRN:  [id number] DOB:  1961-05-29  Chris [name] is an 62 y.o. year old male who is a primary patient of [name], MD.  The [facility] Managed Care Coordination team was consulted for assistance with:    CHF COPD  Chris [name] was given information about Medicaid Managed Care Coordination team services today. Chris [name] Patient agreed to services and verbal consent obtained.  Engaged with patient by telephone for follow up visit in response to provider referral for case management and/or care coordination services.   Assessments/Interventions:  Review of past medical history, allergies, medications, health status, including review of consultants reports, laboratory and other test data, was performed as part of comprehensive evaluation and provision of chronic care management services.  SDOH (Social Determinants of Health) assessments and interventions performed: SDOH Interventions    Flowsheet Row Patient Outreach Telephone from 11/10/2022 in [place] POPULATION HEALTH DEPARTMENT Patient Outreach Telephone from 10/25/2022 in [place] POPULATION HEALTH DEPARTMENT Patient Outreach Telephone from 10/20/2022 in [place] POPULATION HEALTH DEPARTMENT Patient Outreach Telephone from 08/27/2022 in [place] POPULATION HEALTH DEPARTMENT Patient Outreach Telephone from 08/19/2022 in [place] POPULATION HEALTH DEPARTMENT Patient Outreach Telephone from 07/27/2022 in [place] POPULATION HEALTH DEPARTMENT  SDOH Interventions        Transportation Interventions -- -- Payor Benefit -- -- --  Stress Interventions Offered [employer], Provide Counseling  [I am feeling better since I seen my doctor] Provide Counseling, Offered [employer] -- [employer], Provide Counseling Offered [employer], Provide Counseling Provide Counseling,  Offered [facility]       Care Plan  No Known Allergies  Medications Reviewed Today     Reviewed by [name], RN (Registered Nurse) on 12/07/22 at 1633  Med List Status: <None>   Medication Order Taking? Sig Documenting Provider Last Dose Status Informant  Accu-Chek Softclix Lancets lancets [id number] No Use as instructed  Patient not taking: Reported on 12/07/2022   [name], MD Not Taking Active Self  acetaminophen (TYLENOL) 500 MG tablet [id number] No Take 500-1,000 mg by mouth every 6 (six) hours as needed (pain.).  Patient not taking: Reported on 12/07/2022   [provider] Not Taking Active Self  albuterol (PROVENTIL) (2.5 MG/3ML) 0.083% nebulizer solution [id number]  Take 3 mLs (2.5 mg total) by nebulization every 4 (four) hours as needed for wheezing or shortness of breath. [name], MD  Expired 11/19/22 2359 Self  albuterol (VENTOLIN HFA) 108 (90 Base) MCG/ACT inhaler [id number] No Inhale 2 puffs into the lungs every 6 (six) hours as needed for wheezing or shortness of breath.  Patient not taking: Reported on 12/07/2022   [name], MD Not Taking Active Self           Med Note Kimber Relic Nov 15, 2022  1:08 PM)    allopurinol (ZYLOPRIM) 100 MG tablet [id number] No TAKE 2 TABLETS (200 MG TOTAL) BY MOUTH DAILY.  Patient not taking: Reported on 12/07/2022   [name], MD Not Taking Active Self  apixaban (ELIQUIS) 5 MG TABS tablet [id number] No Take 1 tablet (5 mg total) by mouth 2 (two) times daily.  Patient not taking: Reported on 12/07/2022   Laurey Morale, MD Not Taking Active   atorvastatin (LIPITOR) 40 MG tablet [id number] No Take 1 tablet (40 mg total) by  mouth daily.  Patient not taking: Reported on 12/07/2022   Delma Freeze, FNP Not Taking Active Self  Blood Glucose Monitoring Suppl (ACCU-CHEK GUIDE) w/Device KIT [id number] No Use as directed  Patient not taking: Reported on 12/07/2022    [name], MD Not Taking Active Self  budesonide-formoterol [facility]) 160-4.5 MCG/ACT inhaler [id number] No Inhale 2 puffs into the lungs 2 (two) times daily.  Patient not taking: Reported on 12/07/2022   [name], MD Not Taking Active Self  carvedilol (COREG) 12.5 MG tablet [id number] No Take 1 tablet (12.5 mg total) by mouth 2 (two) times daily.  Patient not taking: Reported on 12/07/2022   Laurey Morale, MD Not Taking Active Self  colchicine 0.6 MG tablet [id number] No Take 0.5 tablets (0.3 mg total) by mouth three times a week.  Patient not taking: Reported on 12/07/2022   [name], MD Not Taking Active Self           Med Note Kimber Relic Nov 15, 2022  1:09 PM)    dapagliflozin propanediol (FARXIGA) 10 MG TABS tablet [id number] No Take 1 tablet (10 mg total) by mouth daily.  Patient not taking: Reported on 12/07/2022   Delma Freeze, FNP Not Taking Active Self  DULoxetine (CYMBALTA) 30 MG capsule [id number]  Take 1 capsule (30 mg total) by mouth daily.  Patient taking differently: Take 20 mg by mouth daily.   Park Pope, MD  Expired 11/29/22 2359 Self  glucose blood (ACCU-CHEK GUIDE) test strip [id number] No Use as directed to check blood sugar 1-2 times a day  Patient not taking: Reported on 12/07/2022   [name], MD Not Taking Active Self  isosorbide-hydrALAZINE (BIDIL) 20-37.5 MG tablet [id number] No Take 1 tablet by mouth 3 (three) times daily.  Patient not taking: Reported on 12/07/2022   [name], MD Not Taking Active Self    Discontinued 07/11/20 [phone] (Discontinued by provider)   montelukast (SINGULAIR) 10 MG tablet [id number] No Take 1 tablet (10 mg total) by mouth at bedtime.  Patient not taking: Reported on 12/07/2022   [name], MD Not Taking Active Self  oxyCODONE-acetaminophen (PERCOCET) 5-325 MG tablet [id number] No Take 1 tablet by mouth every 4 (four) hours as needed for severe pain.  Patient not taking:  Reported on 09/28/2022   Minna Antis, MD Not Taking Active Self  potassium chloride (KLOR-CON M) 10 MEQ tablet [id number] No Take 2 tablets (20 mEq total) by mouth daily.  Patient not taking: Reported on 12/07/2022   Laurey Morale, MD Not Taking Active Self  Tiotropium Bromide Monohydrate (SPIRIVA RESPIMAT) 2.5 MCG/ACT AERS [id number] No Inhale 2 puffs into the lungs daily.  Patient not taking: Reported on 11/15/2022   [name], MD Not Taking Active Self  torsemide (DEMADEX) 20 MG tablet [id number] No Take 20-40 mg by mouth See admin instructions. Take 20 mg every other day 40 mg alternate  Patient not taking: Reported on 12/07/2022   [provider] Not Taking Active Self  traZODone (DESYREL) 50 MG tablet [id number] Yes Take 1-2 tablets (50-100 mg total) by mouth at bedtime. Park Pope, MD Taking Active Self           Med Note Kimber Relic Nov 15, 2022  1:09 PM)              Patient Active Problem List   Diagnosis Date Noted   Post traumatic stress disorder 09/23/2022  MDD (major depressive disorder), recurrent episode, moderate (HCC) 08/26/2022   Stage 3b chronic kidney disease (HCC) 08/09/2022   Hypertrophic cardiomyopathy (HCC) 08/09/2022   History of renal cell cancer 08/09/2022   Chronic heart failure with preserved ejection fraction (HFpEF) (HCC) 07/13/2022   Agitation 04/23/2022   RSV (respiratory syncytial virus pneumonia) 04/22/2022   Obesity (BMI 30-39.9) 04/21/2022   COPD exacerbation (HCC) 04/19/2022   Dyslipidemia 04/19/2022   Gout 04/19/2022   Right kidney mass 05/14/2021   CHF exacerbation (HCC) 04/14/2021   Chest pain 04/14/2021   Syncope 04/14/2021   Left-sided weakness 10/28/2020   Typical atrial flutter (HCC)    CHF (congestive heart failure) (HCC) 07/04/2020   Acute exacerbation of CHF (congestive heart failure) (HCC) 06/16/2020   Influenza vaccine refused 05/06/2020   Acute decompensated heart failure (HCC) 05/04/2020    Illiteracy 05/04/2020   Type 2 diabetes mellitus with stage 3 chronic kidney disease (HCC) 12/25/2019   Elevated troponin I level 10/26/2019   History of gout 02/01/2019   Seasonal allergic rhinitis due to pollen 02/01/2019   Tobacco dependence 11/30/2018   Microscopic hematuria 11/30/2018   Depression 11/30/2018   Difficulty controlling anger 11/30/2018   COPD (chronic obstructive pulmonary disease) (HCC)    CKD (chronic kidney disease) stage 3, GFR 30-59 ml/min (HCC) 08/10/2018   Recurrent epistaxis 04/21/2018   Mixed hyperlipidemia 07/28/2017   Essential hypertension 07/28/2017   Chronic systolic heart failure (HCC) 10/25/2014   Cocaine abuse (HCC) 02/20/2013   Cannabis abuse 02/20/2013   Back pain, chronic 02/20/2013    Conditions to be addressed/monitored per PCP order:  CHF and COPD  Care Plan : RN Care Manager Plan of Care  Updates made by [name], RN since 12/08/2022 12:00 AM     Problem: Health Management Needs Related to Heart Failure and Chronic Pain   Priority: High  Note:   Current Barriers:    RNCM Clinical Goal(s):  Patient will take all medications exactly as prescribed and will call provider for medication related questions as evidenced by medication review and patient interview demonstrate Ongoing adherence to prescribed treatment plan for CHF as evidenced by medication review continue to work with RN Care Manager to address care management and care coordination needs related to  CHF as evidenced by adherence to CM Team Scheduled appointments work with Child psychotherapist to address  related to the management of Limited social support, Transportation, and Lack of essential utilities - light/power* related to the management of CHF as evidenced by review of EMR and patient or Child psychotherapist report through collaboration with Medical illustrator, provider, and care team.   Interventions: Collaboration with LCSW/[name]/Care Guide and Heart Failure  RN Inter-disciplinary care team collaboration (see longitudinal plan of care) Evaluation of current treatment plan related to  self management and patient's adherence to plan as established by provider   Heart Failure Interventions:  (Status:  New goal.) Long Term Goal Discussed importance of daily weight and advised patient to weigh and record daily Reviewed role of diuretics in prevention of fluid overload and management of heart failure; Discussed the importance of keeping all appointments with provider Assessed social determinant of health barriers  Discussed importance of self assessment reporting of swelling, increased shortness of breath, chest pain, dizziness, or other new or worsened symptom  Pain Interventions:  (Status:  New goal.) Short Term Goal Pain assessment performed Medications reviewed Reviewed provider established plan for pain management Discussed importance of adherence to all scheduled medical appointments Counseled  on the importance of reporting any/all new or changed pain symptoms or management strategies to pain management provider Reviewed with patient prescribed pharmacological and nonpharmacological pain relief strategies Assessed social determinant of health barriers Advised patient that the CM team would discuss pain management needs with provider and assist with connection to pain management provider  Patient Goals/Self-Care Activities: Take all medications as prescribed Attend all scheduled provider appointments Call provider office for new concerns or questions  Work with the social worker to address care coordination needs and will continue to work with the clinical team to address health care and disease management related needs call 1-800-273-TALK (toll free, 24 hour hotline) if experiencing a Mental Health or Behavioral Health Crisis   Follow Up Plan:  Telephone follow up appointment with care management team member scheduled for:  07/27/22 10am       Long-Range Goal: Independent self health management of Heart Failure and Chronic Pain   Start Date: 07/23/2022  Expected End Date: 02/11/2023  Recent Progress: On track  Priority: High  Note:   Current Barriers:  Knowledge Deficits related to plan of care for management of CHF and Chronic Pain Care Coordination needs related to Transportation  Non-adherence to prescribed medication regimen Difficulty obtaining medications  RNCM Clinical Goal(s):  Patient will take all medications exactly as prescribed and will call provider for medication related questions as evidenced by medication review and patient interview demonstrate Ongoing adherence to prescribed treatment plan for CHF as evidenced by medication review continue to work with RN Care Manager to address care management and care coordination needs related to  CHF as evidenced by adherence to CM Team Scheduled appointments work with social worker to address  related to the management of Limited social support, Transportation, and Lack of essential utilities - light/power* related to the management of CHF as evidenced by review of EMR and patient or Child psychotherapist report through collaboration with Medical illustrator, provider, and care team.   Interventions: Inter-disciplinary care team collaboration (see longitudinal plan of care) Evaluation of current treatment plan related to  self management and patient's adherence to plan as established by provider Collaborated with [facility] [phone], call to pharmacy to ensure patient's prescriptions will be ready today Collaborated with Dr. [name] regarding Bidil prescription Reviewed upcoming appointments including: 12/09/22 with LCSW, 12/13/22 with Dr. Nelly Laurence, 12/14/22 with PCP, 12/17/22 with BH and 12/22/22 with Dr. Shirlee Latch Verified that transportation was arranged by patient Advised patient to take all medications with him to upcoming provider appointments   COPD Interventions:  (Status:    Goal not addressed ) Long Term Goal Provided patient with basic written and verbal COPD education on self care/management/and exacerbation prevention Provided education about and advised patient to utilize infection prevention strategies to reduce risk of respiratory infection Discussed the importance of adequate rest and management of fatigue with COPD Assessed social determinant of health barriers Advised patient to contact [phone] to schedule PFT and [phone] to schedule follow up with [facility] Pharmacy will request refills for inhalers   Heart Failure Interventions:  (Status: goal progressing) Long Term Goal Discussed importance of daily weight and advised patient to weigh and record daily Reviewed role of diuretics in prevention of fluid overload and management of heart failure; Discussed the importance of keeping all appointments with provider Assessed social determinant of health barriers  Discussed importance of self assessment reporting of swelling, increased shortness of breath, chest pain, dizziness, or other new or worsened symptom Reviewed upcoming appointment with 12/09/22 with  LCSW, Dr. Nelly Laurence on 12/13/22, PCP on 12/14/22, BH on 12/17/22 and Dr. Shirlee Latch on 12/22/22  Pain Interventions:  (Status:  stable, not addressed today) Long Term Goal Pain assessment performed Medications reviewed: discussed Cymbalta-patient needs to pick up new dose as prescribed by Psychiatry Reviewed provider established plan for pain management Discussed importance of adherence to all scheduled medical appointments Counseled on the importance of reporting any/all new or changed pain symptoms or management strategies to pain management provider Reviewed with patient prescribed pharmacological and nonpharmacological pain relief strategies Assessed social determinant of health barriers Discussed the benefits of cymbalta  Patient Goals/Self-Care Activities: Take all medications as  prescribed Attend all scheduled provider appointments Call provider office for new concerns or questions  Work with the social worker to address care coordination needs and will continue to work with the clinical team to address health care and disease management related needs call 1-800-273-TALK (toll free, 24 hour hotline) if experiencing a Mental Health or Behavioral Health Crisis   Follow Up Plan:  Telephone follow up appointment with care management team member scheduled for:  01/07/23 @ 9am      Follow Up:  Patient agrees to Care Plan and Follow-up.  Plan: The Managed Medicaid care management team will reach out to the patient again over the next 30 days.  Date/time of next scheduled RN care management/care coordination outreach:  01/07/23 @ 9 am  Estanislado Emms RN, BSN [employer]  Managed Medicaid RN Care Coordinator [phone]

## 2022-12-08 NOTE — Patient Instructions (Signed)
Visit Information  Chris [name] was given information about Medicaid Managed Care team care coordination services as a part of their Healthy [facility] Medicaid benefit. Chris [name] verbally consented to engagement with the [facility] Managed Care team.   If you are experiencing a medical emergency, please call 911 or report to your local emergency department or urgent care.   If you have a non-emergency medical problem during routine business hours, please contact your provider's office and ask to speak with a nurse.   For questions related to your Healthy [facility] health plan, please call: [phone] or visit the homepage here: [email or web address]  If you would like to schedule transportation through your Healthy [facility] plan, please call the following number at least 2 days in advance of your appointment: [phone]  For information about your ride after you set it up, call Ride Assist at [phone]. Use this number to activate a Will Call pickup, or if your transportation is late for a scheduled pickup. Use this number, too, if you need to make a change or cancel a previously scheduled reservation.  If you need transportation services right away, call [phone]. The after-hours call center is staffed 24 hours to handle ride assistance and urgent reservation requests (including discharges) 365 days a year. Urgent trips include sick visits, hospital discharge requests and life-sustaining treatment.  Call the [facility] Line at [phone], at any time, 24 hours a day, 7 days a week. If you are in danger or need immediate medical attention call 911.  If you would like help to quit smoking, call 1-800-QUIT-NOW (825-401-4689) OR Espaol: 1-855-Djelo-Ya (4-742-595-6387) o para ms informacin haga clic aqu or Text READY to 564-332 to register via text  Chris [name],   Please see education materials related to HF provided by  MyChart link.  Patient verbalizes understanding of instructions and care plan provided today and agrees to view in MyChart. Active MyChart status and patient understanding of how to access instructions and care plan via MyChart confirmed with patient.     Telephone follow up appointment with Managed Medicaid care management team member scheduled for:01/07/23 @ 9am  Estanislado Emms RN, BSN [place]  Managed [facility] RN Care Coordinator [phone]   Following is a copy of your plan of care:  Care Plan : RN Care Manager Plan of Care  Updates made by [name], RN since 12/08/2022 12:00 AM     Problem: Health Management Needs Related to Heart Failure and Chronic Pain   Priority: High  Note:   Current Barriers:    RNCM Clinical Goal(s):  Patient will take all medications exactly as prescribed and will call provider for medication related questions as evidenced by medication review and patient interview demonstrate Ongoing adherence to prescribed treatment plan for CHF as evidenced by medication review continue to work with RN Care Manager to address care management and care coordination needs related to  CHF as evidenced by adherence to CM Team Scheduled appointments work with social worker to address  related to the management of Limited social support, Transportation, and Lack of essential utilities - light/power* related to the management of CHF as evidenced by review of EMR and patient or Child psychotherapist report through collaboration with Medical illustrator, provider, and care team.   Interventions: Collaboration with LCSW/[name]/Care Guide and Heart Failure RN Inter-disciplinary care team collaboration (see longitudinal plan of care) Evaluation of current treatment plan related to  self management and patient's adherence to plan as established by  provider   Heart Failure Interventions:  (Status:  New goal.) Long Term Goal Discussed importance of daily weight and advised patient to  weigh and record daily Reviewed role of diuretics in prevention of fluid overload and management of heart failure; Discussed the importance of keeping all appointments with provider Assessed social determinant of health barriers  Discussed importance of self assessment reporting of swelling, increased shortness of breath, chest pain, dizziness, or other new or worsened symptom  Pain Interventions:  (Status:  New goal.) Short Term Goal Pain assessment performed Medications reviewed Reviewed provider established plan for pain management Discussed importance of adherence to all scheduled medical appointments Counseled on the importance of reporting any/all new or changed pain symptoms or management strategies to pain management provider Reviewed with patient prescribed pharmacological and nonpharmacological pain relief strategies Assessed social determinant of health barriers Advised patient that the CM team would discuss pain management needs with provider and assist with connection to pain management provider  Patient Goals/Self-Care Activities: Take all medications as prescribed Attend all scheduled provider appointments Call provider office for new concerns or questions  Work with the social worker to address care coordination needs and will continue to work with the clinical team to address health care and disease management related needs call 1-800-273-TALK (toll free, 24 hour hotline) if experiencing a Mental Health or Behavioral Health Crisis   Follow Up Plan:  Telephone follow up appointment with care management team member scheduled for:  07/27/22 10am      Long-Range Goal: Independent self health management of Heart Failure and Chronic Pain   Start Date: 07/23/2022  Expected End Date: 02/11/2023  Recent Progress: On track  Priority: High  Note:   Current Barriers:  Knowledge Deficits related to plan of care for management of CHF and Chronic Pain Care Coordination needs related  to Transportation  Non-adherence to prescribed medication regimen Difficulty obtaining medications  RNCM Clinical Goal(s):  Patient will take all medications exactly as prescribed and will call provider for medication related questions as evidenced by medication review and patient interview demonstrate Ongoing adherence to prescribed treatment plan for CHF as evidenced by medication review continue to work with RN Care Manager to address care management and care coordination needs related to  CHF as evidenced by adherence to CM Team Scheduled appointments work with social worker to address  related to the management of Limited social support, Transportation, and Lack of essential utilities - light/power* related to the management of CHF as evidenced by review of EMR and patient or Child psychotherapist report through collaboration with Medical illustrator, provider, and care team.   Interventions: Inter-disciplinary care team collaboration (see longitudinal plan of care) Evaluation of current treatment plan related to  self management and patient's adherence to plan as established by provider Collaborated with [facility] [phone], call to pharmacy to ensure patient's prescriptions will be ready today Collaborated with Dr. [name] regarding Bidil prescription Reviewed upcoming appointments including: 12/09/22 with LCSW, 12/13/22 with Dr. Nelly Laurence, 12/14/22 with PCP, 12/17/22 with BH and 12/22/22 with Dr. Shirlee Latch Verified that transportation was arranged by patient Advised patient to take all medications with him to upcoming provider appointments   COPD Interventions:  (Status:   Goal not addressed ) Long Term Goal Provided patient with basic written and verbal COPD education on self care/management/and exacerbation prevention Provided education about and advised patient to utilize infection prevention strategies to reduce risk of respiratory infection Discussed the importance of adequate rest and  management of fatigue  with COPD Assessed social determinant of health barriers Advised patient to contact [phone] to schedule PFT and [phone] to schedule follow up with Pulmonology Pharmacy will request refills for inhalers   Heart Failure Interventions:  (Status: goal progressing) Long Term Goal Discussed importance of daily weight and advised patient to weigh and record daily Reviewed role of diuretics in prevention of fluid overload and management of heart failure; Discussed the importance of keeping all appointments with provider Assessed social determinant of health barriers  Discussed importance of self assessment reporting of swelling, increased shortness of breath, chest pain, dizziness, or other new or worsened symptom Reviewed upcoming appointment with 12/09/22 with LCSW, Dr. Nelly Laurence on 12/13/22, PCP on 12/14/22, BH on 12/17/22 and Dr. Shirlee Latch on 12/22/22  Pain Interventions:  (Status:  stable, not addressed today) Long Term Goal Pain assessment performed Medications reviewed: discussed Cymbalta-patient needs to pick up new dose as prescribed by Psychiatry Reviewed provider established plan for pain management Discussed importance of adherence to all scheduled medical appointments Counseled on the importance of reporting any/all new or changed pain symptoms or management strategies to pain management provider Reviewed with patient prescribed pharmacological and nonpharmacological pain relief strategies Assessed social determinant of health barriers Discussed the benefits of cymbalta  Patient Goals/Self-Care Activities: Take all medications as prescribed Attend all scheduled provider appointments Call provider office for new concerns or questions  Work with the social worker to address care coordination needs and will continue to work with the clinical team to address health care and disease management related needs call 1-800-273-TALK (toll free, 24 hour hotline) if  experiencing a Mental Health or Behavioral Health Crisis   Follow Up Plan:  Telephone follow up appointment with care management team member scheduled for:  01/07/23 @ 9am

## 2022-12-09 ENCOUNTER — Emergency Department (HOSPITAL_COMMUNITY)
Admission: EM | Admit: 2022-12-09 | Discharge: 2022-12-09 | Disposition: A | Discharge status: Home / Self Care | Payer: Medicaid Other | Attending: Emergency Medicine | Admitting: Emergency Medicine

## 2022-12-09 ENCOUNTER — Other Ambulatory Visit: Payer: Self-pay

## 2022-12-09 ENCOUNTER — Other Ambulatory Visit: Payer: Medicaid Other | Admitting: Licensed Clinical Social Worker

## 2022-12-09 DIAGNOSIS — I509 Heart failure, unspecified: Secondary | ICD-10-CM | POA: Insufficient documentation

## 2022-12-09 DIAGNOSIS — I110 Hypertensive heart disease with heart failure: Secondary | ICD-10-CM | POA: Insufficient documentation

## 2022-12-09 DIAGNOSIS — M545 Low back pain, unspecified: Secondary | ICD-10-CM | POA: Diagnosis present

## 2022-12-09 DIAGNOSIS — M10072 Idiopathic gout, left ankle and foot: Secondary | ICD-10-CM

## 2022-12-09 DIAGNOSIS — J449 Chronic obstructive pulmonary disease, unspecified: Secondary | ICD-10-CM | POA: Diagnosis not present

## 2022-12-09 DIAGNOSIS — Z79899 Other long term (current) drug therapy: Secondary | ICD-10-CM | POA: Diagnosis not present

## 2022-12-09 DIAGNOSIS — I10 Essential (primary) hypertension: Secondary | ICD-10-CM

## 2022-12-09 DIAGNOSIS — Z7951 Long term (current) use of inhaled steroids: Secondary | ICD-10-CM | POA: Diagnosis not present

## 2022-12-09 DIAGNOSIS — Z7901 Long term (current) use of anticoagulants: Secondary | ICD-10-CM | POA: Insufficient documentation

## 2022-12-09 DIAGNOSIS — M5441 Lumbago with sciatica, right side: Secondary | ICD-10-CM | POA: Insufficient documentation

## 2022-12-09 DIAGNOSIS — R7989 Other specified abnormal findings of blood chemistry: Secondary | ICD-10-CM | POA: Insufficient documentation

## 2022-12-09 DIAGNOSIS — M109 Gout, unspecified: Secondary | ICD-10-CM

## 2022-12-09 LAB — CBC WITH DIFFERENTIAL/PLATELET
Abs Immature Granulocytes: 0.02 10*3/uL (ref 0.00–0.07)
Basophils Absolute: 0.1 10*3/uL (ref 0.0–0.1)
Basophils Relative: 1 %
Eosinophils Absolute: 0.1 10*3/uL (ref 0.0–0.5)
Eosinophils Relative: 2 %
HCT: 46 % (ref 39.0–52.0)
Hemoglobin: 14.8 g/dL (ref 13.0–17.0)
Immature Granulocytes: 0 %
Lymphocytes Relative: 23 %
Lymphs Abs: 2.1 10*3/uL (ref 0.7–4.0)
MCH: 29.1 pg (ref 26.0–34.0)
MCHC: 32.2 g/dL (ref 30.0–36.0)
MCV: 90.4 fL (ref 80.0–100.0)
Monocytes Absolute: 0.8 10*3/uL (ref 0.1–1.0)
Monocytes Relative: 8 %
Neutro Abs: 6.3 10*3/uL (ref 1.7–7.7)
Neutrophils Relative %: 66 %
Platelets: 166 10*3/uL (ref 150–400)
RBC: 5.09 MIL/uL (ref 4.22–5.81)
RDW: 15.8 % — ABNORMAL HIGH (ref 11.5–15.5)
WBC: 9.4 10*3/uL (ref 4.0–10.5)
nRBC: 0 % (ref 0.0–0.2)

## 2022-12-09 LAB — COMPREHENSIVE METABOLIC PANEL
ALT: 13 U/L (ref 0–44)
AST: 14 U/L — ABNORMAL LOW (ref 15–41)
Albumin: 3.3 g/dL — ABNORMAL LOW (ref 3.5–5.0)
Alkaline Phosphatase: 63 U/L (ref 38–126)
Anion gap: 8 (ref 5–15)
BUN: 17 mg/dL (ref 6–20)
CO2: 25 mmol/L (ref 22–32)
Calcium: 8.9 mg/dL (ref 8.9–10.3)
Chloride: 106 mmol/L (ref 98–111)
Creatinine, Ser: 1.76 mg/dL — ABNORMAL HIGH (ref 0.61–1.24)
GFR, Estimated: 44 mL/min — ABNORMAL LOW (ref >60)
Glucose, Bld: 117 mg/dL — ABNORMAL HIGH (ref 70–99)
Potassium: 3.4 mmol/L — ABNORMAL LOW (ref 3.5–5.1)
Sodium: 139 mmol/L (ref 135–145)
Total Bilirubin: 0.5 mg/dL (ref 0.3–1.2)
Total Protein: 6.7 g/dL (ref 6.5–8.1)

## 2022-12-09 LAB — URINALYSIS, ROUTINE W REFLEX MICROSCOPIC
Bacteria, UA: NONE SEEN
Bilirubin Urine: NEGATIVE
Glucose, UA: >=500 mg/dL — AB
Ketones, ur: NEGATIVE mg/dL
Leukocytes,Ua: NEGATIVE
Nitrite: NEGATIVE
Protein, ur: 100 mg/dL — AB
Specific Gravity, Urine: 1.014 (ref 1.005–1.030)
pH: 6 (ref 5.0–8.0)

## 2022-12-09 LAB — BRAIN NATRIURETIC PEPTIDE: B Natriuretic Peptide: 1016.1 pg/mL — ABNORMAL HIGH (ref 0.0–100.0)

## 2022-12-09 MED ORDER — PREDNISONE 20 MG PO TABS
60.0000 mg | ORAL_TABLET | ORAL | Status: AC
  Administered 2022-12-09: 60 mg via ORAL
  Filled 2022-12-09: qty 3

## 2022-12-09 MED ORDER — CARVEDILOL 12.5 MG PO TABS
12.5000 mg | ORAL_TABLET | Freq: Two times a day (BID) | ORAL | Status: DC
  Filled 2022-12-09: qty 1

## 2022-12-09 MED ORDER — HYDROCODONE-ACETAMINOPHEN 5-325 MG PO TABS: 1.0000 | ORAL_TABLET | Freq: Four times a day (QID) | ORAL | 0 refills | Status: DC | PRN

## 2022-12-09 MED ORDER — ACETAMINOPHEN 500 MG PO TABS: 500.0000 mg | ORAL_TABLET | Freq: Four times a day (QID) | ORAL | 0 refills | Status: DC | PRN

## 2022-12-09 MED ORDER — APIXABAN 5 MG PO TABS
5.0000 mg | ORAL_TABLET | Freq: Two times a day (BID) | ORAL | Status: DC
  Administered 2022-12-09: 5 mg via ORAL
  Filled 2022-12-09: qty 1

## 2022-12-09 MED ORDER — POTASSIUM CHLORIDE CRYS ER 20 MEQ PO TBCR
40.0000 meq | EXTENDED_RELEASE_TABLET | ORAL | Status: AC
  Administered 2022-12-09: 40 meq via ORAL
  Filled 2022-12-09: qty 2

## 2022-12-09 MED ORDER — DULOXETINE HCL 20 MG PO CPEP
20.0000 mg | ORAL_CAPSULE | Freq: Every day | ORAL | Status: DC
  Administered 2022-12-09: 20 mg via ORAL
  Filled 2022-12-09 (×2): qty 1

## 2022-12-09 MED ORDER — CARVEDILOL 12.5 MG PO TABS
12.5000 mg | ORAL_TABLET | Freq: Two times a day (BID) | ORAL | Status: DC
  Administered 2022-12-09: 12.5 mg via ORAL

## 2022-12-09 MED ORDER — TORSEMIDE 40 MG PO TABS: ORAL_TABLET | ORAL | 0 refills | Status: DC

## 2022-12-09 MED ORDER — ALLOPURINOL 100 MG PO TABS
100.0000 mg | ORAL_TABLET | Freq: Every day | ORAL | Status: DC
  Administered 2022-12-09: 100 mg via ORAL
  Filled 2022-12-09: qty 1

## 2022-12-09 MED ORDER — ALLOPURINOL 100 MG PO TABS
ORAL_TABLET | Freq: Every day | ORAL | 0 refills | Status: DC
Start: 2022-12-09 — End: 2023-01-05

## 2022-12-09 MED ORDER — FUROSEMIDE 10 MG/ML IJ SOLN
60.0000 mg | Freq: Once | INTRAMUSCULAR | Status: AC
  Administered 2022-12-09: 60 mg via INTRAVENOUS
  Filled 2022-12-09: qty 6

## 2022-12-09 MED ORDER — ATORVASTATIN CALCIUM 40 MG PO TABS
40.0000 mg | ORAL_TABLET | Freq: Every day | ORAL | Status: DC
  Administered 2022-12-09: 40 mg via ORAL
  Filled 2022-12-09: qty 1

## 2022-12-09 MED ORDER — HYDROCODONE-ACETAMINOPHEN 5-325 MG PO TABS
1.0000 | ORAL_TABLET | Freq: Once | ORAL | Status: AC
  Administered 2022-12-09: 1 via ORAL
  Filled 2022-12-09: qty 1

## 2022-12-09 MED ORDER — DAPAGLIFLOZIN PROPANEDIOL 10 MG PO TABS
10.0000 mg | ORAL_TABLET | Freq: Every day | ORAL | Status: DC
  Administered 2022-12-09: 10 mg via ORAL
  Filled 2022-12-09: qty 1

## 2022-12-09 MED ORDER — ACETAMINOPHEN 500 MG PO TABS
500.0000 mg | ORAL_TABLET | Freq: Four times a day (QID) | ORAL | Status: DC | PRN
  Administered 2022-12-09: 1000 mg via ORAL
  Filled 2022-12-09: qty 2

## 2022-12-09 NOTE — Patient Instructions (Signed)
Visit Information  Mr. Shake was given information about Medicaid Managed Care team care coordination services as a part of their Healthy [facility] Medicaid benefit. LYNDA CAPISTRAN verbally consented to engagement with the [facility] Managed Care team.   If you are experiencing a medical emergency, please call 911 or report to your local emergency department or urgent care.   If you have a non-emergency medical problem during routine business hours, please contact your provider's office and ask to speak with a nurse.   For questions related to your [facility] health plan, please call: [phone] or visit the homepage here: [email or web address]  If you would like to schedule transportation through your Healthy [facility] plan, please call the following number at least 2 days in advance of your appointment: [phone]  For information about your ride after you set it up, call Ride Assist at [phone]. Use this number to activate a Will Call pickup, or if your transportation is late for a scheduled pickup. Use this number, too, if you need to make a change or cancel a previously scheduled reservation.  If you need transportation services right away, call [phone]. The after-hours call center is staffed 24 hours to handle ride assistance and urgent reservation requests (including discharges) 365 days a year. Urgent trips include sick visits, hospital discharge requests and life-sustaining treatment.  Call the [facility] Line at [phone], at any time, 24 hours a day, 7 days a week. If you are in danger or need immediate medical attention call 911.  If you would like help to quit smoking, call 1-800-QUIT-NOW ((607)014-6794) OR Espaol: 1-855-Djelo-Ya (5-638-756-4332) o para ms informacin haga clic aqu or Text READY to 951-884 to register via text   Following is a copy of your plan of care:  Care Plan : General Social Work  (Adult)  Updates made by Gustavus Bryant, LCSW since 12/09/2022 12:00 AM     Problem: Depression Identification (Depression)      Long-Range Goal: To increase my self care and gain mental health support   Start Date: 07/08/2022  Priority: High  Note:   Timeframe:  Long-Range Goal Priority:  High Start Date:   07/08/22                    Expected End Date:ongoing    Follow Up Date- 12/17/22 at 2:45 pm   - begin personal counseling - call and visit an old friend - check out volunteer opportunities - join a support group - laugh; watch a funny movie or comedian - learn and use visualization or guided imagery - perform a random act of kindness - practice relaxation or meditation daily - start or continue a personal journal - talk about feelings with a friend, family or spiritual advisor - practice positive thinking and self-talk    Why is this important?   When you are stressed, down or upset, your body reacts too.  For example, your blood pressure may get higher; you may have a headache or stomachache.  When your emotions get the best of you, your body's ability to fight off cold and flu gets weak.  These steps will help you manage your emotions.   Current Barriers:  Chronic Mental Health needs related to depression and need for housing Limited social support, Housing barriers, and Mental Health Concerns  Social Isolation ADL IADL limitations Suicidal Ideation/Homicidal Ideation: No  Clinical Social Work Goal(s):  Over the next 120 days, patient will work with [employer] monthly  by telephone or in person to reduce or manage symptoms related to depression and relapse prevention patient will work with [name] to address needs related to financial, food and transportation support. Patient wishes to offset medical and housing expenses with financial support as housing resources are very limited at this time  Types of Stress There are two types of stress: Emotional - types of emotional  stress are relationship problems, pressure at work, financial worries, experiencing discrimination or having a major life change. Physical - Examples of physical stress include being sick having pain, not sleeping well, recovery from an injury or having an alcohol and drug use disorder. Fight or Flight Sudden or ongoing stress activates your nervous system and floods your bloodstream with adrenaline and cortisol, two hormones that raise blood pressure, increase heart rate and spike blood sugar. These changes pitch your body into a fight or flight response. That enabled our ancestors to outrun saber-toothed tigers, and it's helpful today for situations like dodging a car accident. But most modern chronic stressors, such as finances or a challenging relationship, keep your body in that heightened state, which hurts your health. Effects of Too Much Stress If constantly under stress, most of [place] will eventually start to function less well.  Multiple studies link chronic stress to a higher risk of heart disease, stroke, depression, weight gain, memory loss and even premature death, so it's important to recognize the warning signals. Talk to your doctor about ways to manage stress if you're experiencing any of these symptoms: Prolonged periods of poor sleep. Regular, severe headaches. Unexplained weight loss or gain. Feelings of isolation, withdrawal or worthlessness. Constant anger and irritability. Loss of interest in activities. Constant worrying or obsessive thinking. Excessive alcohol or drug use. Inability to concentrate.  10 Ways to Cope with Chronic Stress It's key to recognize stressful situations as they occur because it allows you to focus on managing how you react. We all need to know when to close our eyes and take a deep breath when we feel tension rising. Use these tips to prevent or reduce chronic stress. 1. Rebalance Work and Home All work and no play? If you're spending too much time  at the office, intentionally put more dates in your calendar to enjoy time for fun, either alone or with others. 2. Get Regular Exercise Moving your body on a regular basis balances the nervous system and increases blood circulation, helping to flush out stress hormones. Even a daily 20-minute walk makes a difference. Any kind of exercise can lower stress and improve your mood ? just pick activities that you enjoy and make it a regular habit. 3. Eat Well and Limit Alcohol and Stimulants Alcohol, nicotine and caffeine may temporarily relieve stress but have negative health impacts and can make stress worse in the long run. Well-nourished bodies cope better, so start with a good breakfast, add more organic fruits and vegetables for a well-balanced diet, avoid processed foods and sugar, try herbal tea and drink more water. 4. Connect with Supportive People Talking face to face with another person releases hormones that reduce stress. Lean on those good listeners in your life. 5. Carve Out Hobby Time Do you enjoy gardening, reading, listening to music or some other creative pursuit? Engage in activities that bring you pleasure and joy; research shows that reduces stress by almost half and lowers your heart rate, too. 6. Practice Meditation, Stress Reduction or Yoga Relaxation techniques activate a state of restfulness that counterbalances your body's fight-or-flight  hormones. Even if this also means a 10-minute break in a long day: listen to music, read, go for a walk in nature, do a hobby, take a bath or spend time with a friend. Also consider doing a mindfulness exercise or try a daily deep breathing or imagery practice. Deep Breathing Slow, calm and deep breathing can help you relax. Try these steps to focus on your breathing and repeat as needed. Find a comfortable position and close your eyes. Exhale and drop your shoulders. Breathe in through your nose; fill your lungs and then your belly. Think of  relaxing your body, quieting your mind and becoming calm and peaceful. Breathe out slowly through your nose, relaxing your belly. Think of releasing tension, pain, worries or distress. Repeat steps three and four until you feel relaxed. Imagery This involves using your mind to excite the senses -- sound, vision, smell, taste and feeling. This may help ease your stress. Begin by getting comfortable and then do some slow breathing. Imagine a place you love being at. It could be somewhere from your childhood, somewhere you vacationed or just a place in your imagination. Feel how it is to be in the place you're imagining. Pay attention to the sounds, air, colors, and who is there with you. This is a place where you feel cared for and loved. All is well. You are safe. Take in all the smells, sounds, tastes and feelings. As you do, feel your body being nourished and healed. Feel the calm that surrounds you. Breathe in all the good. Breathe out any discomfort or tension. 7. Sleep Enough If you get less than seven to eight hours of sleep, your body won't tolerate stress as well as it could. If stress keeps you up at night, address the cause, and add extra meditation into your day to make up for the lost z's. Try to get seven to nine hours of sleep each night. Make a regular bedtime schedule. Keep your room dark and cool. Try to avoid computers, TV, cell phones and tablets before bed. 8. Bond with Connections You Enjoy Go out for a coffee with a friend, chat with a neighbor, call a family member, visit with a clergy member, or even hang out with your pet. Clinical studies show that spending even a short time with a companion animal can cut anxiety levels almost in half. 9. Take a Vacation Getting away from it all can reset your stress tolerance by increasing your mental and emotional outlook, which makes you a happier, more productive person upon return. Leave your cellphone and laptop at home! 10. See a  Counselor, Coach or Therapist If negative thoughts overwhelm your ability to make positive changes, it's time to seek professional help. Make an appointment today--your health and life are worth it."     24- Hour Availability:    [facility]  [address] [place], [place] [phone] Crisis [phone]   [facility] [phone]   [place] [facility]  [phone]    [facility]  [phone] (after hours)   Therapeutic Alternative/Mobile Crisis   [phone]   [place] National Suicide Hotline  847-759-4578 [place] Child[place]) [place] 016   Call 911 or go to emergency room   [facility]  [phone]);  [place] and [employer]  [phone]); [place], [place], [place], [place], [place], [place], [place]        Follow up goal  Dickie La, [name], MSW, [facility] Managed Medicaid LCSW [facility]  [facility] [email or web address] Phone: [phone]

## 2022-12-09 NOTE — Patient Outreach (Signed)
Medicaid Managed Care Social Work Note  12/09/2022 Name:  Chris [name] MRN:  [id number] DOB:  [birth year]  Chris [name] is an 62 y.o. year old male who is a primary patient of Chris Matar, MD.  The Medicaid Managed Care Coordination team was consulted for assistance with:  Mental Health Counseling and Resources  Chris [name] was given information about Medicaid Managed Care Coordination team services today. Chris [name] Patient agreed to services and verbal consent obtained. Patient is currently in the ED for back pain. He reports ongoing issues with his pain and mood management.   Engaged with patient  for by telephone forfollow up visit in response to referral for case management and/or care coordination services.   Assessments/Interventions:  Review of past medical history, allergies, medications, health status, including review of consultants reports, laboratory and other test data, was performed as part of comprehensive evaluation and provision of chronic care management services.  SDOH: (Social Determinant of Health) assessments and interventions performed: SDOH Interventions    Flowsheet Row Patient Outreach Telephone from 11/10/2022 in [place] POPULATION HEALTH DEPARTMENT Patient Outreach Telephone from 10/25/2022 in [place] POPULATION HEALTH DEPARTMENT Patient Outreach Telephone from 10/20/2022 in [place] POPULATION HEALTH DEPARTMENT Patient Outreach Telephone from 08/27/2022 in [place] POPULATION HEALTH DEPARTMENT Patient Outreach Telephone from 08/19/2022 in [place] POPULATION HEALTH DEPARTMENT Patient Outreach Telephone from 07/27/2022 in [place] POPULATION HEALTH DEPARTMENT  SDOH Interventions        Transportation Interventions -- -- Payor Benefit -- -- --  Stress Interventions Offered [employer], Provide Counseling  [I am feeling better since I seen my doctor] Provide Counseling, Offered [employer] Resources -- [employer], Provide Counseling Offered [employer], Provide Counseling Provide Counseling, Offered [facility]       Advanced Directives Status:  See [place] application for related entries.  Care Plan                 No Known Allergies  Medications Reviewed Today     Reviewed by Desma Paganini, RN (Registered Nurse) on 12/09/22 at [phone]  Med List Status: <None>   Medication Order Taking? Sig Documenting Provider Last Dose Status Informant  Accu-Chek Softclix Lancets lancets [id number]  Use as instructed  Patient not taking: Reported on 12/07/2022   Chris Matar, MD  Active Self  acetaminophen (TYLENOL) 500 MG tablet [id number]  Take 500-1,000 mg by mouth every 6 (six) hours as needed (pain.).  Patient not taking: Reported on 12/07/2022   [provider]  Active Self  albuterol (PROVENTIL) (2.5 MG/3ML) 0.083% nebulizer solution [id number]  Take 3 mLs (2.5 mg total) by nebulization every 4 (four) hours as needed for wheezing or shortness of breath. Chris Matar, MD  Expired 11/19/22 2359 Self  albuterol (VENTOLIN HFA) 108 (90 Base) MCG/ACT inhaler [id number]  Inhale 2 puffs into the lungs every 6 (six) hours as needed for wheezing or shortness of breath. Chris Matar, MD  Active   allopurinol (ZYLOPRIM) 100 MG tablet [id number]  TAKE 2 TABLETS (200 MG TOTAL) BY MOUTH DAILY.  Patient not taking: Reported on 12/07/2022   Chris Matar, MD  Active Self  apixaban (ELIQUIS) 5 MG TABS tablet [id number]  Take 1 tablet (5 mg total) by mouth 2 (two) times daily. Laurey Morale, MD  Active   atorvastatin (LIPITOR) 40 MG tablet [id number]  Take 1 tablet (40 mg total) by mouth daily. Marca Ancona  S, MD  Active   Blood Glucose Monitoring Suppl (ACCU-CHEK GUIDE) w/Device KIT [id number]  Use as directed  Patient not taking: Reported on 12/07/2022   Chris Matar, MD  Active Self  budesonide-formoterol [facility]) 160-4.5  MCG/ACT inhaler [id number]  Inhale 2 puffs into the lungs 2 (two) times daily. Chris Matar, MD  Active   carvedilol (COREG) 12.5 MG tablet [id number]  Take 1 tablet (12.5 mg total) by mouth 2 (two) times daily. Laurey Morale, MD  Active   colchicine 0.6 MG tablet [id number]  Take 0.5 tablets (0.3 mg total) by mouth three times a week. Chris Matar, MD  Active   dapagliflozin propanediol (FARXIGA) 10 MG TABS tablet [id number]  Take 1 tablet (10 mg total) by mouth daily. Chris Matar, MD  Active   DULoxetine (CYMBALTA) 30 MG capsule [id number]  Take 1 capsule (30 mg total) by mouth daily.  Patient taking differently: Take 20 mg by mouth daily.   Park Pope, MD  Expired 11/29/22 2359 Self  glucose blood (ACCU-CHEK GUIDE) test strip [id number]  Use as directed to check blood sugar 1-2 times a day  Patient not taking: Reported on 12/07/2022   Chris Matar, MD  Active Self  isosorbide-hydrALAZINE (BIDIL) 20-37.5 MG tablet [id number]  Take 1 tablet by mouth 3 (three) times daily.  Patient not taking: Reported on 12/07/2022   [name], MD  Active Self  potassium chloride (KLOR-CON M) 10 MEQ tablet [id number]  Take 2 tablets (20 mEq total) by mouth daily. Laurey Morale, MD  Active   Tiotropium Bromide Monohydrate (SPIRIVA RESPIMAT) 2.5 MCG/ACT AERS [id number]  Inhale 2 puffs into the lungs daily. Chris Matar, MD  Active   torsemide (DEMADEX) 20 MG tablet [id number]  Take 20 mg daily, alternating with 40 mg daily. Laurey Morale, MD  Active   traZODone (DESYREL) 50 MG tablet [id number]  Take 1-2 tablets (50-100 mg total) by mouth at bedtime. Chris Matar, MD  Active             Patient Active Problem List   Diagnosis Date Noted   Post traumatic stress disorder 09/23/2022   MDD (major depressive disorder), recurrent episode, moderate (HCC) 08/26/2022   Stage 3b chronic kidney disease (HCC) 08/09/2022   Hypertrophic cardiomyopathy (HCC) 08/09/2022    History of renal cell cancer 08/09/2022   Chronic heart failure with preserved ejection fraction (HFpEF) (HCC) 07/13/2022   Agitation 04/23/2022   RSV (respiratory syncytial virus pneumonia) 04/22/2022   Obesity (BMI 30-39.9) 04/21/2022   COPD exacerbation (HCC) 04/19/2022   Dyslipidemia 04/19/2022   Gout 04/19/2022   Right kidney mass 05/14/2021   CHF exacerbation (HCC) 04/14/2021   Chest pain 04/14/2021   Syncope 04/14/2021   Left-sided weakness 10/28/2020   Typical atrial flutter (HCC)    CHF (congestive heart failure) (HCC) 07/04/2020   Acute exacerbation of CHF (congestive heart failure) (HCC) 06/16/2020   Influenza vaccine refused 05/06/2020   Acute decompensated heart failure (HCC) 05/04/2020   Illiteracy 05/04/2020   Type 2 diabetes mellitus with stage 3 chronic kidney disease (HCC) 12/25/2019   Elevated troponin I level 10/26/2019   History of gout 02/01/2019   Seasonal allergic rhinitis due to pollen 02/01/2019   Tobacco dependence 11/30/2018   Microscopic hematuria 11/30/2018   Depression 11/30/2018   Difficulty controlling anger 11/30/2018   COPD (chronic obstructive pulmonary disease) (HCC)    CKD (chronic kidney disease) stage 3, GFR 30-59 ml/min (HCC) 08/10/2018  Recurrent epistaxis 04/21/2018   Mixed hyperlipidemia 07/28/2017   Essential hypertension 07/28/2017   Chronic systolic heart failure (HCC) 10/25/2014   Cocaine abuse (HCC) 02/20/2013   Cannabis abuse 02/20/2013   Back pain, chronic 02/20/2013    Conditions to be addressed/monitored per PCP order:  Depression  Care Plan : General Social Work (Adult)  Updates made by Gustavus Bryant, LCSW since 12/09/2022 12:00 AM     Problem: Depression Identification (Depression)      Long-Range Goal: To increase my self care and gain mental health support   Start Date: 07/08/2022  Priority: High  Note:   Timeframe:  Long-Range Goal Priority:  High Start Date:   07/08/22                    Expected End  Date:ongoing    Follow Up Date- 12/17/22 at 2:45 pm   - begin personal counseling - call and visit an old friend - check out volunteer opportunities - join a support group - laugh; watch a funny movie or comedian - learn and use visualization or guided imagery - perform a random act of kindness - practice relaxation or meditation daily - start or continue a personal journal - talk about feelings with a friend, family or spiritual advisor - practice positive thinking and self-talk    Why is this important?   When you are stressed, down or upset, your body reacts too.  For example, your blood pressure may get higher; you may have a headache or stomachache.  When your emotions get the best of you, your body's ability to fight off cold and flu gets weak.  These steps will help you manage your emotions.   Current Barriers:  Chronic Mental Health needs related to depression and need for housing Limited social support, Housing barriers, and Mental Health Concerns  Social Isolation ADL IADL limitations Suicidal Ideation/Homicidal Ideation: No  Clinical Social Work Goal(s):  Over the next 120 days, patient will work with LCSW monthly by telephone or in person to reduce or manage symptoms related to depression and relapse prevention patient will work with [name] to address needs related to financial, food and transportation support. Patient wishes to offset medical and housing expenses with financial support as housing resources are very limited at this time  Interventions: Patient interviewed and appropriate assessments performed: brief mental health assessment Discussed plans with patient for ongoing care management follow up and provided patient with direct contact information for care management team Assisted patient/caregiver with obtaining information about health plan benefits Patient was educated on the Advance Directives process. Patient was encouraged to complete document with PCP.   Advance Directive education was provided to patient again on 07/08/22 and 08/19/22 Encouraged patient to consider a mental health provider for long term follow up and therapy/counseling and patient is now agreeable. Referral made to [facility] for both counseling and psychiatry.  Patient reports recent agitation due to his inability to meet his daily mental and physical needs. [facility] LCSW provided emotional support and reflective listening.  Solution-Focused Strategies, Mindfulness or Relaxation Training, Active listening / Reflection utilized , Emotional Supportive Provided, and Verbalization of feelings encouraged  Patient declines any current substance use. Patient has a history of cocaine abuse. Relapse prevention education provided.  Patient reports that he has developed social anxiety. He admits that he gets irritable at times and will snap at his loved ones and then will experience guilt afterwards. Educated patient on coping methods to implement into his  daily life to combat anxiety symptoms and stress. Patient denied any current suicidal or homicidal ideations. Encouraged patient to implement deep breathing and grounding exercises into his daily routine due to ongoing anxiety and SOB.  Patient reports that he will start implementing appropriate self-care habits into his daily routine such as: drinking water, staying active around the house, taking his medications as prescribed, combating negative thoughts or emotions and staying connected with his support network. Positive reinforcement provided.  [facility] LCSW made referral for [facility] [name] and [facility] RNCM involvement. Message sent to entire team by [facility] LCSW. 07/27/22 LCSW update- Patient and [facility] LCSW contacted [facility] together and scheduled him for both psychiatry and counseling next month. Patient's counseling is virtual so he will not need transportation assistance for this appointment only for psychiatry. [facility] LCSW will update [facility] [name]. [facility] LCSW had patient physically  write down his appointment reminders as he is having issues with his memory. Self-care education provided. [facility] LCSW 08/19/22 update- Patient and [facility] LCSW contacted [facility] together and left a message regarding his upcoming appointments as he has scheduled transportation for BOTH appointments even though of one those appointments is virtual. However, he has not filled out their intake paperwork and will need a ride to do this in person. [facility] LCSW encouraged patient to keep trying to reach [facility] in order to gain appropriate instructions on what to expect during his first visit. 08/27/22 update- Patient was able to successfully attend his psychiatry appointment yesterday. He was reminded of his upcoming appointments at [facility] for next month. He reports being in a lot of pain which has triggered his stress. Emotional support and resource education provided. LCSW update- Patient successfully went to [facility] for therapy and has a scheduled psychiatry visit next week. Patient reports that self care and stress management tools are not effectively working for him due to the amount of pain he is in. Care coordination completed with [facility] RNCM today. Update- Patient has upcoming initial therapy appointment on 5/16 at 3 pm and was advised to get there early to do their enrollment paperwork. He has an overlapping appointment that same day at 4:15 in [place] and was advised to let his care providers know. Crisis support resources reviewed as well. Update- Patient was provided positive feedback for attending cardiologist, PCP, counseling and psychiatry appointments over the last 30 days. Patient has follow up psychiatry appointment tomorrow and confirms stable transportation to this. He reports that his new medicines that his psychiatrist prescribes are helping especially regarding his sleep at night due to his chronic pain. Update- Patient is currently at the ED for back pain. He reports that he is in a lot of pain and it is triggering  his mood management. Patient was having difficulty answering [facility] LCSW's questions due to his current state and [facility] LCSW advised him that she will contact him on 12/17/22 to follow up on this ED visit. Brief pain management education and emotional support provided.  09/15/21: [name] completed telephone outreach with patient for utility assistance. Patient stated that he has a cut off notice for 09/25/21 and his bill is $700.00. [name] informed patient he can go to the [facility] and can also [employer]. [name] provided patient with the telephone number for [employer]. Patient stated no other resources are needed at this time.   Patient Self Care Activities:  Ability for insight Agreement to start mental health treatment and to the self-care journey   Patient Coping Strengths:  Self Advocate Able to Communicate Effectively  Patient Self Care Deficits:  Lacks social connections  The following coping skill education was provided for stress relief and mental health management: "When your car dies or a deadline looms, how do you respond? Long-term, low-grade or acute stress takes a serious toll on your body and mind, so don't ignore feelings of constant tension. Stress is a natural part of life. However, too much stress can harm our health, especially if it continues every day. This is chronic stress and can put you at risk for heart problems like heart disease and depression. Understand what's happening inside your body and learn simple coping skills to combat the negative impacts of everyday stressors.  Types of Stress There are two types of stress: Emotional - types of emotional stress are relationship problems, pressure at work, financial worries, experiencing discrimination or having a major life change. Physical - Examples of physical stress include being sick having pain, not sleeping well, recovery from an injury or having an alcohol and drug use  disorder. Fight or Flight Sudden or ongoing stress activates your nervous system and floods your bloodstream with adrenaline and cortisol, two hormones that raise blood pressure, increase heart rate and spike blood sugar. These changes pitch your body into a fight or flight response. That enabled our ancestors to outrun saber-toothed tigers, and it's helpful today for situations like dodging a car accident. But most modern chronic stressors, such as finances or a challenging relationship, keep your body in that heightened state, which hurts your health. Effects of Too Much Stress If constantly under stress, most of [place] will eventually start to function less well.  Multiple studies link chronic stress to a higher risk of heart disease, stroke, depression, weight gain, memory loss and even premature death, so it's important to recognize the warning signals. Talk to your doctor about ways to manage stress if you're experiencing any of these symptoms: Prolonged periods of poor sleep. Regular, severe headaches. Unexplained weight loss or gain. Feelings of isolation, withdrawal or worthlessness. Constant anger and irritability. Loss of interest in activities. Constant worrying or obsessive thinking. Excessive alcohol or drug use. Inability to concentrate.  10 Ways to Cope with Chronic Stress It's key to recognize stressful situations as they occur because it allows you to focus on managing how you react. We all need to know when to close our eyes and take a deep breath when we feel tension rising. Use these tips to prevent or reduce chronic stress. 1. Rebalance Work and Home All work and no play? If you're spending too much time at the office, intentionally put more dates in your calendar to enjoy time for fun, either alone or with others. 2. Get Regular Exercise Moving your body on a regular basis balances the nervous system and increases blood circulation, helping to flush out stress hormones. Even  a daily 20-minute walk makes a difference. Any kind of exercise can lower stress and improve your mood ? just pick activities that you enjoy and make it a regular habit. 3. Eat Well and Limit Alcohol and Stimulants Alcohol, nicotine and caffeine may temporarily relieve stress but have negative health impacts and can make stress worse in the long run. Well-nourished bodies cope better, so start with a good breakfast, add more organic fruits and vegetables for a well-balanced diet, avoid processed foods and sugar, try herbal tea and drink more water. 4. Connect with Supportive People Talking face to face with another person releases hormones that  reduce stress. Lean on those good listeners in your life. 5. Carve Out Hobby Time Do you enjoy gardening, reading, listening to music or some other creative pursuit? Engage in activities that bring you pleasure and joy; research shows that reduces stress by almost half and lowers your heart rate, too. 6. Practice Meditation, Stress Reduction or Yoga Relaxation techniques activate a state of restfulness that counterbalances your body's fight-or-flight hormones. Even if this also means a 10-minute break in a long day: listen to music, read, go for a walk in nature, do a hobby, take a bath or spend time with a friend. Also consider doing a mindfulness exercise or try a daily deep breathing or imagery practice. Deep Breathing Slow, calm and deep breathing can help you relax. Try these steps to focus on your breathing and repeat as needed. Find a comfortable position and close your eyes. Exhale and drop your shoulders. Breathe in through your nose; fill your lungs and then your belly. Think of relaxing your body, quieting your mind and becoming calm and peaceful. Breathe out slowly through your nose, relaxing your belly. Think of releasing tension, pain, worries or distress. Repeat steps three and four until you feel relaxed. Imagery This involves using your mind  to excite the senses -- sound, vision, smell, taste and feeling. This may help ease your stress. Begin by getting comfortable and then do some slow breathing. Imagine a place you love being at. It could be somewhere from your childhood, somewhere you vacationed or just a place in your imagination. Feel how it is to be in the place you're imagining. Pay attention to the sounds, air, colors, and who is there with you. This is a place where you feel cared for and loved. All is well. You are safe. Take in all the smells, sounds, tastes and feelings. As you do, feel your body being nourished and healed. Feel the calm that surrounds you. Breathe in all the good. Breathe out any discomfort or tension. 7. Sleep Enough If you get less than seven to eight hours of sleep, your body won't tolerate stress as well as it could. If stress keeps you up at night, address the cause, and add extra meditation into your day to make up for the lost z's. Try to get seven to nine hours of sleep each night. Make a regular bedtime schedule. Keep your room dark and cool. Try to avoid computers, TV, cell phones and tablets before bed. 8. Bond with Connections You Enjoy Go out for a coffee with a friend, chat with a neighbor, call a family member, visit with a clergy member, or even hang out with your pet. Clinical studies show that spending even a short time with a companion animal can cut anxiety levels almost in half. 9. Take a Vacation Getting away from it all can reset your stress tolerance by increasing your mental and emotional outlook, which makes you a happier, more productive person upon return. Leave your cellphone and laptop at home! 10. See a Counselor, Coach or Therapist If negative thoughts overwhelm your ability to make positive changes, it's time to seek professional help. Make an appointment today--your health and life are worth it."     24- Hour Availability:    [facility]  [address] [place], [place] [phone] Crisis [phone]   [facility] [phone]   [facility]  [phone]    [facility] [employer] [facility]  [phone] (after hours)   Therapeutic Alternative/Mobile Crisis   [phone]   [place] National Suicide Hotline  [phone] Len Childs) OR [phone]   Call 911 or go to emergency room   [facility]  [phone]);  [place] and [employer]  [phone]); [place], [place], [place], [place], [place], [place], [place]        Follow up goal      Follow up:  Patient agrees to Care Plan and Follow-up.  Plan: The Managed Medicaid care management team will reach out to the patient again over the next 30 days.  Dickie La, [name], MSW, [place] [employer] [place] Managed Medicaid LCSW [facility]  [facility] [email or web address] Phone: [phone]

## 2022-12-09 NOTE — ED Triage Notes (Signed)
Patient with hx of gout here for eval of lower back pain with radiation down R leg. Denies injury. States pain x 1 month but worse for the last few days. No OTC meds. Also has brought all of his medications and is unsure how to be taking them so would like advisement on when to be taking what medication.

## 2022-12-09 NOTE — Discharge Instructions (Signed)
As discussed, with your need to take multiple medications it is important that you discuss your prescriptions with your primary care physician.  Please take your medications as prescribed on your discharge summary until you have seen your physician.  Return here for concerning changes in your condition.

## 2022-12-09 NOTE — ED Provider Notes (Signed)
[place] EMERGENCY DEPARTMENT AT [facility] Provider Note   CSN: [id number] Arrival date & time: 12/09/22  0825     History  Chief Complaint  Patient presents with   Back Pain    Chris [name] is a 62 y.o. male.  HPI Multiple medical problems presents with pain in multiple areas.  He notes that he is uncertain about which medications he is supposed be taking and thus has not been taking his medications regularly.  He presents today seemingly with primary concern of pain in the right lower back rating down the right leg, but with additional concerns shoulder pain, left foot pain.  No chest pain, no dyspnea.  He also notes malodorous urine, but no dysuria, no penile changes, no scrotum pain.  He notes that he has had sexual relations with an individual and is concerned about possible infection.    Home Medications Prior to Admission medications   Medication Sig Start Date End Date Taking? Authorizing Provider  Accu-Chek Softclix Lancets lancets Use as instructed Patient not taking: Reported on 12/07/2022 04/28/22   [name], MD  acetaminophen (TYLENOL) 500 MG tablet Take 1-2 tablets (500-1,000 mg total) by mouth every 6 (six) hours as needed (pain.). 12/09/22   Gerhard Munch, MD  albuterol (PROVENTIL) (2.5 MG/3ML) 0.083% nebulizer solution Take 3 mLs (2.5 mg total) by nebulization every 4 (four) hours as needed for wheezing or shortness of breath. 10/20/22 11/19/22  [name], MD  albuterol (VENTOLIN HFA) 108 (90 Base) MCG/ACT inhaler Inhale 2 puffs into the lungs every 6 (six) hours as needed for wheezing or shortness of breath. 12/08/22   [name], MD  allopurinol (ZYLOPRIM) 100 MG tablet TAKE 2 TABLETS (200 MG TOTAL) BY MOUTH DAILY. 12/09/22   Gerhard Munch, MD  apixaban (ELIQUIS) 5 MG TABS tablet Take 1 tablet (5 mg total) by mouth 2 (two) times daily. 12/08/22   Laurey Morale, MD  atorvastatin (LIPITOR) 40 MG tablet Take 1 tablet (40 mg  total) by mouth daily. 12/08/22   Laurey Morale, MD  Blood Glucose Monitoring Suppl (ACCU-CHEK GUIDE) w/Device KIT Use as directed Patient not taking: Reported on 12/07/2022 04/28/22   [name], MD  budesonide-formoterol [facility]) 160-4.5 MCG/ACT inhaler Inhale 2 puffs into the lungs 2 (two) times daily. 12/08/22   [name], MD  carvedilol (COREG) 12.5 MG tablet Take 1 tablet (12.5 mg total) by mouth 2 (two) times daily. 12/08/22   Laurey Morale, MD  dapagliflozin propanediol (FARXIGA) 10 MG TABS tablet Take 1 tablet (10 mg total) by mouth daily. 12/08/22   [name], MD  DULoxetine (CYMBALTA) 30 MG capsule Take 1 capsule (30 mg total) by mouth daily. Patient taking differently: Take 20 mg by mouth daily. 09/30/22 11/29/22  Park Pope, MD  glucose blood (ACCU-CHEK GUIDE) test strip Use as directed to check blood sugar 1-2 times a day Patient not taking: Reported on 12/07/2022 04/28/22   [name], MD  isosorbide-hydrALAZINE (BIDIL) 20-37.5 MG tablet Take 1 tablet by mouth 3 (three) times daily. Patient not taking: Reported on 12/07/2022 08/03/22   [name], MD  potassium chloride (KLOR-CON M) 10 MEQ tablet Take 2 tablets (20 mEq total) by mouth daily. 12/08/22   Laurey Morale, MD  Tiotropium Bromide Monohydrate (SPIRIVA RESPIMAT) 2.5 MCG/ACT AERS Inhale 2 puffs into the lungs daily. 12/08/22   [name], MD  torsemide 40 MG TABS Take 20 mg daily, alternating  with 40 mg daily. 12/09/22   Gerhard Munch, MD  traZODone (DESYREL) 50 MG tablet Take 1-2 tablets (50-100 mg total) by mouth at bedtime. 12/08/22   [name], MD  metoprolol tartrate (LOPRESSOR) 100 MG tablet Take 1 tablet (100 mg total) by mouth 2 (two) times daily. 07/09/20 07/11/20  Marrion Coy, MD      Allergies    Patient has no known allergies.    Review of Systems   Review of Systems  All other systems reviewed and are negative.   Physical Exam Updated Vital  Signs BP (!) 164/123 (BP Location: Right Arm)   Pulse 77   Temp 97.7 F (36.5 C) (Oral)   Resp 18   SpO2 91%  Physical Exam Vitals and nursing note reviewed.  Constitutional:      General: He is not in acute distress.    Appearance: He is well-developed.  HENT:     Head: Normocephalic and atraumatic.  Eyes:     Conjunctiva/sclera: Conjunctivae normal.  Cardiovascular:     Rate and Rhythm: Normal rate and regular rhythm.     Pulses: Normal pulses.  Pulmonary:     Effort: Pulmonary effort is normal. No respiratory distress.     Breath sounds: No stridor.  Abdominal:     General: There is no distension.  Musculoskeletal:     Comments: Patient describes pain in the right lower back rating down the right leg, but has no focal abnormalities, deformities.  Skin:    General: Skin is warm and dry.  Neurological:     Mental Status: He is alert and oriented to person, place, and time.  Psychiatric:     Comments: Perseverating on taking his medications in an appropriate manner.     ED Results / Procedures / Treatments   Labs (all labs ordered are listed, but only abnormal results are displayed) Labs Reviewed  BRAIN NATRIURETIC PEPTIDE - Abnormal; Notable for the following components:      Result Value   B Natriuretic Peptide 1,016.1 (*)    All other components within normal limits  COMPREHENSIVE METABOLIC PANEL - Abnormal; Notable for the following components:   Potassium 3.4 (*)    Glucose, Bld 117 (*)    Creatinine, Ser 1.76 (*)    Albumin 3.3 (*)    AST 14 (*)    GFR, Estimated 44 (*)    All other components within normal limits  CBC WITH DIFFERENTIAL/PLATELET - Abnormal; Notable for the following components:   RDW 15.8 (*)    All other components within normal limits  URINALYSIS, ROUTINE W REFLEX MICROSCOPIC - Abnormal; Notable for the following components:   Glucose, UA >=500 (*)    Hgb urine dipstick SMALL (*)    Protein, ur 100 (*)    All other components within  normal limits    EKG None  Radiology No results found.  Procedures Procedures    Medications Ordered in ED Medications  acetaminophen (TYLENOL) tablet 500-1,000 mg (1,000 mg Oral Given 12/09/22 1015)  allopurinol (ZYLOPRIM) tablet 100 mg (100 mg Oral Given 12/09/22 1016)  apixaban (ELIQUIS) tablet 5 mg (5 mg Oral Given 12/09/22 1015)  atorvastatin (LIPITOR) tablet 40 mg (40 mg Oral Given 12/09/22 1015)  dapagliflozin propanediol (FARXIGA) tablet 10 mg (10 mg Oral Given 12/09/22 1015)  DULoxetine (CYMBALTA) DR capsule 20 mg (20 mg Oral Given 12/09/22 1014)  carvedilol (COREG) tablet 12.5 mg (12.5 mg Oral Given 12/09/22 1015)  potassium chloride SA (KLOR-CON M) CR  tablet 40 mEq (has no administration in time range)  furosemide (LASIX) injection 60 mg (has no administration in time range)  HYDROcodone-acetaminophen (NORCO/VICODIN) 5-325 MG per tablet 1 tablet (has no administration in time range)  predniSONE (DELTASONE) tablet 60 mg (60 mg Oral Given 12/09/22 1015)    ED Course/ Medical Decision Making/ A&P                             Medical Decision Making Adult male with multimedical problems including COPD, hypertension, cocaine abuse, CHF, gout, major depressive disorder presents with multiple complaints.  Broad differential including gout flare, sciatica, worsening renal function or heart failure all considered.  On monitor the patient has sinus rhythm, rate 85 unremarkable Pulse ox is 99% on room air this is also unremarkable.  Patient is mildly hypertensive and given his vital signs no early evidence for bacteremia, sepsis, he is also distally neurovascularly unremarkable. Labs sent given his history, unclear medication use.   Amount and/or Complexity of Data Reviewed External Data Reviewed: notes. Labs: ordered. Decision-making details documented in ED Course.  Risk OTC drugs. Prescription drug management. Decision regarding hospitalization. Diagnosis or treatment  significantly limited by social determinants of health.   11:44 AM Patient awake, alert, in no distress, speaking clearly, no increased work of breathing, no hemodynamic instability.  I reviewed his labs, discussed findings with him.  He does have elevated BNP, but no evidence for increased respiratory effort, and clear lung sounds.  Suspicion for patient's medication noncompliance contributing to this. Patient's presentation for hip pain is consistent with sciatica without red flags suggesting CNS dysfunction.  He and I had a conversation about appropriate medications, and the importance of following up with his primary care physician to coordinate his medication efforts as he notes that his different practitioners are each advising different medication regimens.  This requires reconciliation and the patient can do that with his physician in the office. No evidence for distress, no evidence for increased work of breathing, no clinical evidence for pneumonia, no evidence of bacteremia, sepsis, and as above, the patient's pain in the right leg with radiation is most consistent with sciatica.  Patient received analgesics, medication reconciliation as above was discharged in stable condition.        Final Clinical Impression(s) / ED Diagnoses Final diagnoses:  Acute right-sided low back pain with right-sided sciatica  Elevated brain natriuretic peptide (BNP) level    Rx / DC Orders ED Discharge Orders          Ordered    torsemide 40 MG TABS        12/09/22 1144    acetaminophen (TYLENOL) 500 MG tablet  Every 6 hours PRN        12/09/22 1144    allopurinol (ZYLOPRIM) 100 MG tablet  Daily        12/09/22 1144              Gerhard Munch, MD 12/09/22 1144

## 2022-12-10 ENCOUNTER — Telehealth: Payer: Self-pay | Admitting: Licensed Clinical Social Worker

## 2022-12-10 NOTE — Transitions of Care (Post Inpatient/ED Visit) (Signed)
12/10/2022  Name: Chris [name] MRN: [id number] DOB: Nov 02, 1960  Today's TOC FU Call Status: Today's TOC FU Call Status:: Successful TOC FU Call Competed TOC FU Call Complete Date: 12/10/22  Transition Care Management Follow-up Telephone Call Date of Discharge: 12/09/22 Discharge Facility: [facility]) Type of Discharge: Emergency Department Reason for ED Visit: Other: Any questions or concerns?: No  Items Reviewed: Did you receive and understand the discharge instructions provided?: Yes Medications obtained,verified, and reconciled?: Yes (Medications Reviewed) Any new allergies since your discharge?: No Dietary orders reviewed?: No  Medications Reviewed Today: Medications Reviewed Today     Reviewed by Desma Paganini, RN (Registered Nurse) on 12/09/22 at [phone]  Med List Status: <None>   Medication Order Taking? Sig Documenting Provider Last Dose Status Informant  Accu-Chek Softclix Lancets lancets [id number]  Use as instructed  Patient not taking: Reported on 12/07/2022   [name], MD  Active Self  acetaminophen (TYLENOL) 500 MG tablet [id number]  Take 500-1,000 mg by mouth every 6 (six) hours as needed (pain.).  Patient not taking: Reported on 12/07/2022   [provider]  Active Self  albuterol (PROVENTIL) (2.5 MG/3ML) 0.083% nebulizer solution [id number]  Take 3 mLs (2.5 mg total) by nebulization every 4 (four) hours as needed for wheezing or shortness of breath. [name], MD  Expired 11/19/22 2359 Self  albuterol (VENTOLIN HFA) 108 (90 Base) MCG/ACT inhaler [id number]  Inhale 2 puffs into the lungs every 6 (six) hours as needed for wheezing or shortness of breath. [name], MD  Active   allopurinol (ZYLOPRIM) 100 MG tablet [id number]  TAKE 2 TABLETS (200 MG TOTAL) BY MOUTH DAILY.  Patient not taking: Reported on 12/07/2022   [name], MD  Active Self  apixaban (ELIQUIS) 5 MG TABS tablet [id number]  Take 1 tablet (5 mg total) by  mouth 2 (two) times daily. Laurey Morale, MD  Active   atorvastatin (LIPITOR) 40 MG tablet [id number]  Take 1 tablet (40 mg total) by mouth daily. Laurey Morale, MD  Active   Blood Glucose Monitoring Suppl (ACCU-CHEK GUIDE) w/Device Andria Rhein [id number]  Use as directed  Patient not taking: Reported on 12/07/2022   [name], MD  Active Self  budesonide-formoterol [facility]) 160-4.5 MCG/ACT inhaler [id number]  Inhale 2 puffs into the lungs 2 (two) times daily. [name], MD  Active   carvedilol (COREG) 12.5 MG tablet [id number]  Take 1 tablet (12.5 mg total) by mouth 2 (two) times daily. Laurey Morale, MD  Active   colchicine 0.6 MG tablet [id number]  Take 0.5 tablets (0.3 mg total) by mouth three times a week. [name], MD  Active   dapagliflozin propanediol (FARXIGA) 10 MG TABS tablet [id number]  Take 1 tablet (10 mg total) by mouth daily. [name], MD  Active   DULoxetine (CYMBALTA) 30 MG capsule [id number]  Take 1 capsule (30 mg total) by mouth daily.  Patient taking differently: Take 20 mg by mouth daily.   Park Pope, MD  Expired 11/29/22 2359 Self  glucose blood (ACCU-CHEK GUIDE) test strip [id number]  Use as directed to check blood sugar 1-2 times a day  Patient not taking: Reported on 12/07/2022   [name], MD  Active Self  isosorbide-hydrALAZINE (BIDIL) 20-37.5 MG tablet [id number]  Take 1 tablet by mouth 3 (three) times daily.  Patient not taking: Reported on 12/07/2022   [name], MD  Active Self  potassium chloride (KLOR-CON  M) 10 MEQ tablet [id number]  Take 2 tablets (20 mEq total) by mouth daily. Laurey Morale, MD  Active   Tiotropium Bromide Monohydrate (SPIRIVA RESPIMAT) 2.5 MCG/ACT AERS [id number]  Inhale 2 puffs into the lungs daily. [name], MD  Active   torsemide (DEMADEX) 20 MG tablet [id number]  Take 20 mg daily, alternating with 40 mg daily. Laurey Morale, MD  Active   traZODone (DESYREL) 50 MG tablet  [id number]  Take 1-2 tablets (50-100 mg total) by mouth at bedtime. [name], MD  Active             Home Care and Equipment/Supplies: Were Home Health Services Ordered?: No Any new equipment or medical supplies ordered?: No  Functional Questionnaire: Do you need assistance with bathing/showering or dressing?: No Do you need assistance with meal preparation?: No Do you need assistance with eating?: No Do you have difficulty maintaining continence: No Do you need assistance with getting out of bed/getting out of a chair/moving?: No Do you have difficulty managing or taking your medications?: No  Follow up appointments reviewed: PCP Follow-up appointment confirmed?: Yes Date of PCP follow-up appointment?: 12/14/22 Follow-up Provider: Dr. Jonah Blue Do you understand care options if your condition(s) worsen?: Yes-patient verbalized understanding  Dickie La, [name], MSW, LCSW Managed Medicaid LCSW [facility]  [facility] [email or web address] Phone: [phone]

## 2022-12-13 ENCOUNTER — Ambulatory Visit: Payer: Medicaid Other | Attending: Cardiovascular Disease | Admitting: Cardiovascular Disease

## 2022-12-13 ENCOUNTER — Encounter: Payer: Self-pay | Admitting: Cardiovascular Disease

## 2022-12-14 ENCOUNTER — Other Ambulatory Visit (HOSPITAL_COMMUNITY): Payer: Self-pay

## 2022-12-14 ENCOUNTER — Ambulatory Visit: Payer: Medicaid Other | Admitting: Internal Medicine

## 2022-12-17 ENCOUNTER — Encounter (HOSPITAL_COMMUNITY): Payer: Self-pay | Admitting: Student

## 2022-12-17 ENCOUNTER — Other Ambulatory Visit: Payer: Medicaid Other | Admitting: Licensed Clinical Social Worker

## 2022-12-17 ENCOUNTER — Ambulatory Visit (INDEPENDENT_AMBULATORY_CARE_PROVIDER_SITE_OTHER): Payer: Medicaid Other | Admitting: Student

## 2022-12-17 VITALS — BP 150/86 | HR 78 | Wt 260.0 lb

## 2022-12-17 DIAGNOSIS — F4310 Post-traumatic stress disorder, unspecified: Secondary | ICD-10-CM | POA: Diagnosis not present

## 2022-12-17 DIAGNOSIS — F331 Major depressive disorder, recurrent, moderate: Secondary | ICD-10-CM

## 2022-12-17 DIAGNOSIS — F17200 Nicotine dependence, unspecified, uncomplicated: Secondary | ICD-10-CM | POA: Diagnosis not present

## 2022-12-17 MED ORDER — HYDROXYZINE HCL 25 MG PO TABS
25.0000 mg | ORAL_TABLET | Freq: Three times a day (TID) | ORAL | 1 refills | Status: DC | PRN
Start: 2022-12-17 — End: 2023-01-28

## 2022-12-17 MED ORDER — MIRTAZAPINE 15 MG PO TABS
15.0000 mg | ORAL_TABLET | Freq: Every day | ORAL | 1 refills | Status: DC
Start: 2022-12-17 — End: 2023-01-28

## 2022-12-17 NOTE — Patient Instructions (Signed)
Visit Information  Chris [name] was given information about Medicaid Managed Care team care coordination services as a part of their Healthy [facility] Medicaid benefit. Chris [name] verbally consented to engagement with the [facility] Managed Care team.   If you are experiencing a medical emergency, please call 911 or report to your local emergency department or urgent care.   If you have a non-emergency medical problem during routine business hours, please contact your provider's office and ask to speak with a nurse.   For questions related to your Healthy [facility] health plan, please call: [phone] or visit the homepage here: [email or web address]  If you would like to schedule transportation through your Healthy [facility] plan, please call the following number at least 2 days in advance of your appointment: [phone]  For information about your ride after you set it up, call Ride Assist at [phone]. Use this number to activate a Will Call pickup, or if your transportation is late for a scheduled pickup. Use this number, too, if you need to make a change or cancel a previously scheduled reservation.  If you need transportation services right away, call [phone]. The after-hours call center is staffed 24 hours to handle ride assistance and urgent reservation requests (including discharges) 365 days a year. Urgent trips include sick visits, hospital discharge requests and life-sustaining treatment.  Call the [facility] Line at [phone], at any time, 24 hours a day, 7 days a week. If you are in danger or need immediate medical attention call 911.  If you would like help to quit smoking, call 1-800-QUIT-NOW ((628) 275-5318) OR Espaol: 1-855-Djelo-Ya (4-742-595-6387) o para ms informacin haga clic aqu or Text READY to 564-332 to register via text   Following is a copy of your plan of care:  Care Plan : General Social Work  (Adult)  Updates made by Gustavus Bryant, LCSW since 12/17/2022 12:00 AM     Problem: Depression Identification (Depression)      Long-Range Goal: To increase my self care and gain mental health support   Start Date: 07/08/2022  Priority: High  Note:   Timeframe:  Long-Range Goal Priority:  High Start Date:   07/08/22                    Expected End Date:ongoing    Follow Up Date- 01/17/23 at 330 pm   - begin personal counseling - call and visit an old friend - check out volunteer opportunities - join a support group - laugh; watch a funny movie or comedian - learn and use visualization or guided imagery - perform a random act of kindness - practice relaxation or meditation daily - start or continue a personal journal - talk about feelings with a friend, family or spiritual advisor - practice positive thinking and self-talk    Why is this important?   When you are stressed, down or upset, your body reacts too.  For example, your blood pressure may get higher; you may have a headache or stomachache.  When your emotions get the best of you, your body's ability to fight off cold and flu gets weak.  These steps will help you manage your emotions.   Current Barriers:  Chronic Mental Health needs related to depression and need for housing Limited social support, Housing barriers, and Mental Health Concerns  Social Isolation ADL IADL limitations Suicidal Ideation/Homicidal Ideation: No  Clinical Social Work Goal(s):  Over the next 120 days, patient will work with [employer] monthly  by telephone or in person to reduce or manage symptoms related to depression and relapse prevention patient will work with [name] to address needs related to financial, food and transportation support. Patient wishes to offset medical and housing expenses with financial support as housing resources are very limited at this time  Patient Self Care Activities:  Ability for insight Agreement to start mental health  treatment and to the self-care journey   Patient Coping Strengths:  Self Advocate Able to Communicate Effectively  Patient Self Care Deficits:  Lacks social connections  The following coping skill education was provided for stress relief and mental health management: "When your car dies or a deadline looms, how do you respond? Long-term, low-grade or acute stress takes a serious toll on your body and mind, so don't ignore feelings of constant tension. Stress is a natural part of life. However, too much stress can harm our health, especially if it continues every day. This is chronic stress and can put you at risk for heart problems like heart disease and depression. Understand what's happening inside your body and learn simple coping skills to combat the negative impacts of everyday stressors.  Types of Stress There are two types of stress: Emotional - types of emotional stress are relationship problems, pressure at work, financial worries, experiencing discrimination or having a major life change. Physical - Examples of physical stress include being sick having pain, not sleeping well, recovery from an injury or having an alcohol and drug use disorder. Fight or Flight Sudden or ongoing stress activates your nervous system and floods your bloodstream with adrenaline and cortisol, two hormones that raise blood pressure, increase heart rate and spike blood sugar. These changes pitch your body into a fight or flight response. That enabled our ancestors to outrun saber-toothed tigers, and it's helpful today for situations like dodging a car accident. But most modern chronic stressors, such as finances or a challenging relationship, keep your body in that heightened state, which hurts your health. Effects of Too Much Stress If constantly under stress, most of [place] will eventually start to function less well.  Multiple studies link chronic stress to a higher risk of heart disease, stroke, depression,  weight gain, memory loss and even premature death, so it's important to recognize the warning signals. Talk to your doctor about ways to manage stress if you're experiencing any of these symptoms: Prolonged periods of poor sleep. Regular, severe headaches. Unexplained weight loss or gain. Feelings of isolation, withdrawal or worthlessness. Constant anger and irritability. Loss of interest in activities. Constant worrying or obsessive thinking. Excessive alcohol or drug use. Inability to concentrate.  10 Ways to Cope with Chronic Stress It's key to recognize stressful situations as they occur because it allows you to focus on managing how you react. We all need to know when to close our eyes and take a deep breath when we feel tension rising. Use these tips to prevent or reduce chronic stress. 1. Rebalance Work and Home All work and no play? If you're spending too much time at the office, intentionally put more dates in your calendar to enjoy time for fun, either alone or with others. 2. Get Regular Exercise Moving your body on a regular basis balances the nervous system and increases blood circulation, helping to flush out stress hormones. Even a daily 20-minute walk makes a difference. Any kind of exercise can lower stress and improve your mood ? just pick activities that you enjoy and make it a regular habit.  3. Eat Well and Limit Alcohol and Stimulants Alcohol, nicotine and caffeine may temporarily relieve stress but have negative health impacts and can make stress worse in the long run. Well-nourished bodies cope better, so start with a good breakfast, add more organic fruits and vegetables for a well-balanced diet, avoid processed foods and sugar, try herbal tea and drink more water. 4. Connect with Supportive People Talking face to face with another person releases hormones that reduce stress. Lean on those good listeners in your life. 5. Carve Out Hobby Time Do you enjoy gardening,  reading, listening to music or some other creative pursuit? Engage in activities that bring you pleasure and joy; research shows that reduces stress by almost half and lowers your heart rate, too. 6. Practice Meditation, Stress Reduction or Yoga Relaxation techniques activate a state of restfulness that counterbalances your body's fight-or-flight hormones. Even if this also means a 10-minute break in a long day: listen to music, read, go for a walk in nature, do a hobby, take a bath or spend time with a friend. Also consider doing a mindfulness exercise or try a daily deep breathing or imagery practice. Deep Breathing Slow, calm and deep breathing can help you relax. Try these steps to focus on your breathing and repeat as needed. Find a comfortable position and close your eyes. Exhale and drop your shoulders. Breathe in through your nose; fill your lungs and then your belly. Think of relaxing your body, quieting your mind and becoming calm and peaceful. Breathe out slowly through your nose, relaxing your belly. Think of releasing tension, pain, worries or distress. Repeat steps three and four until you feel relaxed. Imagery This involves using your mind to excite the senses -- sound, vision, smell, taste and feeling. This may help ease your stress. Begin by getting comfortable and then do some slow breathing. Imagine a place you love being at. It could be somewhere from your childhood, somewhere you vacationed or just a place in your imagination. Feel how it is to be in the place you're imagining. Pay attention to the sounds, air, colors, and who is there with you. This is a place where you feel cared for and loved. All is well. You are safe. Take in all the smells, sounds, tastes and feelings. As you do, feel your body being nourished and healed. Feel the calm that surrounds you. Breathe in all the good. Breathe out any discomfort or tension. 7. Sleep Enough If you get less than seven to eight  hours of sleep, your body won't tolerate stress as well as it could. If stress keeps you up at night, address the cause, and add extra meditation into your day to make up for the lost z's. Try to get seven to nine hours of sleep each night. Make a regular bedtime schedule. Keep your room dark and cool. Try to avoid computers, TV, cell phones and tablets before bed. 8. Bond with Connections You Enjoy Go out for a coffee with a friend, chat with a neighbor, call a family member, visit with a clergy member, or even hang out with your pet. Clinical studies show that spending even a short time with a companion animal can cut anxiety levels almost in half. 9. Take a Vacation Getting away from it all can reset your stress tolerance by increasing your mental and emotional outlook, which makes you a happier, more productive person upon return. Leave your cellphone and laptop at home! 10. See a Veterinary surgeon, Educational psychologist  If negative thoughts overwhelm your ability to make positive changes, it's time to seek professional help. Make an appointment today--your health and life are worth it."     24- Hour Availability:    [facility]  [address] [place], [place] [phone] Crisis [phone]   [facility] [phone]   [place] [facility]  [phone]    [facility]  [phone] (after hours)   Therapeutic Alternative/Mobile Crisis   [phone]   [place] National Suicide Hotline  [phone] Len [address]) [place] [address]   Call 911 or go to emergency room   [facility]  [phone]);  [place] and [employer]  [phone]); [place], [place], [place], [place], [place], [place], [place]        Follow up goal      Dickie La, [name], MSW, [employer] Managed Medicaid LCSW [facility]  [facility] [email or web address] Phone: [phone]

## 2022-12-17 NOTE — Progress Notes (Signed)
BH MD Outpatient Progress Note  12/17/2022 9:42 AM Chris [name]  MRN:  [id number]  Assessment:  Patient has had worsening depression and insomnia for the past few weeks. He did not fill his duloxetine. Started mirtazapine to aid with depression, anxiety, and insomnia. He did inquire about alprazolam prescription but after discussing risks/benefits given his multiple comorbidities, decision was made to start hydroxyzine instead. Discussed risk for sedation for both of these medications and patient has agreed to not operate vehicles or heavy machinery after medication use. He does find psychotherapy has been helpful to him at this time.   Identifying Information: Chris [name] is a 62 y.o. y.o. male with a history of depression, hypertension, obesity, HFrEF (initial EF 25%, normalized to 50%) atrial flutter s/p DCCV 06/2020, COPD, current smoker x40+ years, former cocaine use x25+ years, HCM, CKD who is an established patient with [facility] for medication management.   Plan: # Major Depressive Disorder-recurrent episode, moderate Past medication trials:  Status of problem: improving Interventions: -- Discontinued duloxetine since he did not fill -- START mirtazapine 15 mg at bedtime for depression, insomnia, and appetite stimulation -- START hydroxyzine 25 mg tid prn for anxiety  #Nicotine use disorder --Encourage cessation   Patient was given contact information for behavioral health clinic and was instructed to call 911 for emergencies.   Subjective:  Chief Complaint:  Chief Complaint  Patient presents with   Medication Management    Interval History:  Patient reports worsening of his depression and anxiety. He reports having poor sleep in maintaining sleep and sleep initiation.  He reports having occasional nightmares but does not appear to be explicitly related to trauma that has happened in the past.  He attributes this predominantly to his anxiety and  endorses multiple stressors including court appointments, medical probles, and dealing with neighbors and people in general which has led to him self-isolating more. He reports poor appetite as well secondary to his anxiety and depression. He would like to trial mirtazapine to better aid with depression with additional benefits to sleep and appetite stimulation.  Denies SI/HI/AVH at this time.  He denies any illicit substance use.  He endorses continued cigarette use but reports that is less than a pack per day.  He does inform me that he used a friend's Xanax once in order to see if it would help with his anxiety and he stated that it was extremely beneficial and wanted to see if he could be on a prescription of this.  I discussed with him the risks/benefits of this medication and stated that the risk for far outweighing of the benefits given his multiple medical comorbidities, his age, and risk for addiction.  He verbalized understanding and we elected to trial hydroxyzine instead as he had not previously trialed this.    Visit Diagnosis:    ICD-10-CM   1. MDD (major depressive disorder), recurrent episode, moderate (HCC)  F33.1 mirtazapine (REMERON) 15 MG tablet    hydrOXYzine (ATARAX) 25 MG tablet    2. Post traumatic stress disorder  F43.10 mirtazapine (REMERON) 15 MG tablet    3. Nicotine use disorder  F17.200       Past Psychiatric History:  Diagnoses: none Medication trials: duloxetine Previous psychiatrist/therapist: none Hospitalizations: none Suicide attempts: denies SIB: denies Hx of violence towards others: denies Current access to guns: denies  Past Medical History:  Past Medical History:  Diagnosis Date   Arrhythmia    atrial flutter   CHF (  congestive heart failure) (HCC)    Chronic kidney disease    COPD (chronic obstructive pulmonary disease) (HCC)    Coronary artery disease    Depression    Diabetes mellitus without complication (HCC)    GERD (gastroesophageal  reflux disease)    Gout    Hypertension    Influenza A with respiratory manifestations    Mental disorder     Past Surgical History:  Procedure Laterality Date   ANKLE SURGERY     CARDIAC CATHETERIZATION     CARDIOVERSION N/A 07/08/2020   Procedure: CARDIOVERSION;  Surgeon: Lamar Blinks, MD;  Location: [facility] ORS;  Service: Cardiovascular;  Laterality: N/A;   COLONOSCOPY WITH PROPOFOL N/A 11/19/2021   Procedure: COLONOSCOPY WITH PROPOFOL;  Surgeon: Jenel Lucks, MD;  Location: [facility];  Service: Gastroenterology;  Laterality: N/A;   HERNIA REPAIR     x2   IR RADIOLOGIST EVAL & MGMT  05/13/2022   IR RADIOLOGIST EVAL & MGMT  10/27/2022   POLYPECTOMY  11/19/2021   Procedure: POLYPECTOMY;  Surgeon: Jenel Lucks, MD;  Location: [facility];  Service: Gastroenterology;;   RADIOLOGY WITH ANESTHESIA N/A 06/23/2022   Procedure: Right renal tumor ablation;  Surgeon: Bennie Dallas, MD;  Location: [facility] OR;  Service: Radiology;  Laterality: N/A;   RIGHT HEART CATH N/A 07/13/2022   Procedure: RIGHT HEART CATH;  Surgeon: Laurey Morale, MD;  Location: [facility] INVASIVE CV LAB;  Service: Cardiovascular;  Laterality: N/A;   RIGHT/LEFT HEART CATH AND CORONARY ANGIOGRAPHY N/A 11/19/2022   Procedure: RIGHT/LEFT HEART CATH AND CORONARY ANGIOGRAPHY;  Surgeon: Laurey Morale, MD;  Location: [facility] INVASIVE CV LAB;  Service: Cardiovascular;  Laterality: N/A;   SHOULDER SURGERY     TEE WITHOUT CARDIOVERSION N/A 07/08/2020   Procedure: TRANSESOPHAGEAL ECHOCARDIOGRAM (TEE);  Surgeon: Lamar Blinks, MD;  Location: [facility] ORS;  Service: Cardiovascular;  Laterality: N/A;    Family History:  Family History  Problem Relation Age of Onset   Heart disease Father    Diabetes Mother    HIV Brother    Healthy Son    Healthy Daughter     Social History:  Social History   Socioeconomic History   Marital status: Divorced    Spouse name: Not on file   Number of children: 3   Years of education: Not on  file   Highest education level: High school graduate  Occupational History   Occupation: disability  Tobacco Use   Smoking status: Every Day    Packs/day: 1.00    Years: 43.00    Additional pack years: 0.00    Total pack years: 43.00    Types: Cigarettes   Smokeless tobacco: Never   Tobacco comments:        4 cigs daily--05/12/2022  Vaping Use   Vaping Use: Never used  Substance and Sexual Activity   Alcohol use: Yes    Alcohol/week: 4.0 standard drinks of alcohol    Types: 4 Shots of liquor per week   Drug use: Yes    Frequency: 21.0 times per week    Types: Marijuana    Comment: last use Cocaine- 03/28/2021. Still using marijuana, last use 06/22/21   Sexual activity: Not on file  Other Topics Concern   Not on file  Social History Narrative   ** Merged History Encounter **       Social Determinants of Health   Financial Resource Strain: Medium Risk (07/23/2022)   Overall Financial Resource Strain (CARDIA)  Difficulty of Paying Living Expenses: Somewhat hard  Food Insecurity: No Food Insecurity (04/20/2022)   Hunger Vital Sign    Worried About Running Out of Food in the Last Year: Never true    Ran Out of Food in the Last Year: Never true  Transportation Needs: Unmet Transportation Needs (10/20/2022)   PRAPARE - Transportation    Lack of Transportation (Medical): Yes    Lack of Transportation (Non-Medical): Yes  Physical Activity: Insufficiently Active (11/16/2021)   Exercise Vital Sign    Days of Exercise per Week: 5 days    Minutes of Exercise per Session: 20 min  Stress: Stress Concern Present (11/10/2022)   [employer] of Occupational Health - Occupational Stress Questionnaire    Feeling of Stress : To some extent  Social Connections: Moderately Isolated (07/23/2022)   Social Connection and Isolation Panel [NHANES]    Frequency of Communication with Friends and Family: Twice a week    Frequency of Social Gatherings with Friends and Family: Once a week     Attends Religious Services: More than 4 times per year    Active Member of [employer] or Organizations: No    Attends Banker Meetings: Never    Marital Status: Divorced    Allergies: No Known Allergies  Current Medications: Current Outpatient Medications  Medication Sig Dispense Refill   hydrOXYzine (ATARAX) 25 MG tablet Take 1 tablet (25 mg total) by mouth 3 (three) times daily as needed. 60 tablet 1   mirtazapine (REMERON) 15 MG tablet Take 1 tablet (15 mg total) by mouth at bedtime. 30 tablet 1   Accu-Chek Softclix Lancets lancets Use as instructed (Patient not taking: Reported on 12/07/2022) 100 each 12   acetaminophen (TYLENOL) 500 MG tablet Take 1-2 tablets (500-1,000 mg total) by mouth every 6 (six) hours as needed (pain.). 30 tablet 0   albuterol (PROVENTIL) (2.5 MG/3ML) 0.083% nebulizer solution Take 3 mLs (2.5 mg total) by nebulization every 4 (four) hours as needed for wheezing or shortness of breath. 300 mL 0   albuterol (VENTOLIN HFA) 108 (90 Base) MCG/ACT inhaler Inhale 2 puffs into the lungs every 6 (six) hours as needed for wheezing or shortness of breath. 54 g 6   allopurinol (ZYLOPRIM) 100 MG tablet TAKE 2 TABLETS (200 MG TOTAL) BY MOUTH DAILY. 60 tablet 0   apixaban (ELIQUIS) 5 MG TABS tablet Take 1 tablet (5 mg total) by mouth 2 (two) times daily. 60 tablet 11   atorvastatin (LIPITOR) 40 MG tablet Take 1 tablet (40 mg total) by mouth daily. 90 tablet 3   Blood Glucose Monitoring Suppl (ACCU-CHEK GUIDE) w/Device KIT Use as directed (Patient not taking: Reported on 12/07/2022) 1 kit 0   budesonide-formoterol (SYMBICORT) 160-4.5 MCG/ACT inhaler Inhale 2 puffs into the lungs 2 (two) times daily. 30.6 g 6   carvedilol (COREG) 12.5 MG tablet Take 1 tablet (12.5 mg total) by mouth 2 (two) times daily. 180 tablet 3   dapagliflozin propanediol (FARXIGA) 10 MG TABS tablet Take 1 tablet (10 mg total) by mouth daily. 90 tablet 3   glucose blood (ACCU-CHEK GUIDE) test strip Use  as directed to check blood sugar 1-2 times a day (Patient not taking: Reported on 12/07/2022) 100 each 12   HYDROcodone-acetaminophen (NORCO/VICODIN) 5-325 MG tablet Take 1 tablet by mouth every 6 (six) hours as needed for severe pain. 10 tablet 0   isosorbide-hydrALAZINE (BIDIL) 20-37.5 MG tablet Take 1 tablet by mouth 3 (three) times daily. (Patient not taking: Reported on  12/07/2022) 270 tablet 3   potassium chloride (KLOR-CON M) 10 MEQ tablet Take 2 tablets (20 mEq total) by mouth daily. 180 tablet 3   Tiotropium Bromide Monohydrate (SPIRIVA RESPIMAT) 2.5 MCG/ACT AERS Inhale 2 puffs into the lungs daily. 4 g 6   torsemide 40 MG TABS Take 20 mg daily, alternating with 40 mg daily. 30 tablet 0   No current facility-administered medications for this visit.    ROS:   Objective:  Psychiatric Specialty Exam: Blood pressure (!) 150/86, pulse 78, weight 260 lb (117.9 kg), SpO2 95 %.Body mass index is 35.26 kg/m.  General Appearance: Casual  Eye Contact:  Good  Speech:  Clear and Coherent  Volume:  Normal  Mood:  Anxious and Depressed  Affect:  Appropriate and Congruent  Thought Process:  Coherent, Goal Directed, and Linear  Orientation:  Full (Time, Place, and Person)  Thought Content: Logical   Suicidal Thoughts:  No  Homicidal Thoughts:  No  Memory:  Negative  Judgment:  Intact  Insight:  Fair  Psychomotor Activity:  Normal  Concentration:  Concentration: Fair              Assets:  Communication Skills Desire for Improvement Financial Resources/Insurance Housing  ADL's:  Intact  Cognition: WNL  Sleep:  Poor   PE: General: fatigued, fairly groomed Pulm: no increased work of breathing on room air  Strength & Muscle Tone: within normal limits Neuro: no focal neurological deficits observed  Gait & Station: slight limp when walking  Metabolic Disorder Labs: Lab Results  Component Value Date   HGBA1C 6.2 08/09/2022   MPG 131.24 04/20/2022   MPG 151.33 02/09/2021    No results found for: "PROLACTIN" Lab Results  Component Value Date   CHOL 176 12/25/2019   TRIG 91 12/25/2019   HDL 37 (L) 12/25/2019   CHOLHDL 4.8 12/25/2019   VLDL 18 12/25/2019   LDLCALC 121 (H) 12/25/2019   LDLCALC 92 11/07/2019   Lab Results  Component Value Date   TSH 1.592 10/26/2019   TSH 0.842 11/30/2018    Therapeutic Level Labs: No results found for: "LITHIUM" No results found for: "VALPROATE" No results found for: "CBMZ"  Screenings: AUDIT    Flowsheet Row [facility]  Alcohol Use Disorder Identification Test Final Score (AUDIT) 1      GAD-7    Flowsheet Row Counselor from 09/23/2022 in [facility] Office Visit from 08/09/2022 in [facility] Office Visit from 05/11/2022 in [facility] Office Visit from 12/21/2021 in [facility] Office Visit from 05/14/2021 in [facility]  Total GAD-7 Score 20 15 16 8 5       PHQ2-9    Flowsheet Row Counselor from 09/23/2022 in [facility] Office Visit from 08/26/2022 in [facility] Office Visit from 08/09/2022 in [facility] Patient Outreach Telephone from 07/08/2022 in [place] [facility] Office Visit from 05/11/2022 in [facility]  PHQ-2 Total Score 6 5 5 4 2   PHQ-9 Total Score 21 17 17 16 14       Flowsheet Row ED from 12/09/2022 in [facility] Emergency Department at [facility] Admission (Discharged) from 11/19/2022 in [facility] CARDIAC CATH LAB ED from 07/20/2022 in [facility]  Emergency Department at [facility]  C-SSRS RISK CATEGORY No Risk No Risk No Risk       Collaboration of Care: Collaboration of  Care:   Patient/Guardian was advised Release of Information must be obtained prior to any record release in order to collaborate their care with an outside provider. Patient/Guardian was advised if they have not already done so to contact the registration department to sign all necessary forms in order for [place] to release information regarding their care.   Consent: Patient/Guardian gives verbal consent for treatment and assignment of benefits for services provided during this visit. Patient/Guardian expressed understanding and agreed to proceed.   A total of 30 minutes was spent involved in face to face clinical care, chart review, and documentation.   Park Pope, MD 12/17/2022, 9:42 AM

## 2022-12-17 NOTE — Patient Outreach (Signed)
Medicaid Managed Care Social Work Note  12/17/2022 Name:  Chris [name] MRN:  [id number] DOB:  01-23-1961  ERLIN LAWHORN is an 62 y.o. year old male who is a primary patient of [name], Chris [name].  The Medicaid Managed Care Coordination team was consulted for assistance with:  Mental Health Counseling and Resources  Chris [name] was given information about Medicaid Managed Care Coordination team services today. Chris [name] Patient agreed to services and verbal consent obtained.  Engaged with patient  for by telephone forfollow up visit in response to referral for case management and/or care coordination services.   Assessments/Interventions:  Review of past medical history, allergies, medications, health status, including review of consultants reports, laboratory and other test data, was performed as part of comprehensive evaluation and provision of chronic care management services.  SDOH: (Social Determinant of Health) assessments and interventions performed: SDOH Interventions    Flowsheet Row Patient Outreach Telephone from 12/17/2022 in [place] POPULATION HEALTH DEPARTMENT Patient Outreach Telephone from 11/10/2022 in [place] POPULATION HEALTH DEPARTMENT Patient Outreach Telephone from 10/25/2022 in [place] POPULATION HEALTH DEPARTMENT Patient Outreach Telephone from 10/20/2022 in [place] POPULATION HEALTH DEPARTMENT Patient Outreach Telephone from 08/27/2022 in [place] POPULATION HEALTH DEPARTMENT Patient Outreach Telephone from 08/19/2022 in [place] POPULATION HEALTH DEPARTMENT  SDOH Interventions        Transportation Interventions -- -- -- Payor Benefit -- --  Stress Interventions Offered [employer], Provide Counseling Offered [employer], Provide Counseling  [I am feeling better since I seen my doctor] Provide Counseling, Offered [employer] -- [employer], Provide Counseling Offered  [employer], Provide Counseling       Advanced Directives Status:  See Care Plan for related entries.  Care Plan                 No Known Allergies  Medications Reviewed Today     Reviewed by Chris Pope, Chris [name] (Resident) on 12/17/22 at [phone]  Med List Status: <None>   Medication Order Taking? Sig Documenting Provider Last Dose Status Informant  Accu-Chek Softclix Lancets lancets [id number] No Use as instructed  Patient not taking: Reported on 12/07/2022   [name], Chris [name] Not Taking Active Self  acetaminophen (TYLENOL) 500 MG tablet [id number]  Take 1-2 tablets (500-1,000 mg total) by mouth every 6 (six) hours as needed (pain.). Gerhard Munch, Chris [name]  Active   albuterol (PROVENTIL) (2.5 MG/3ML) 0.083% nebulizer solution [id number] No Take 3 mLs (2.5 mg total) by nebulization every 4 (four) hours as needed for wheezing or shortness of breath. [name], Chris [name] 11/18/2022 Expired 11/19/22 2359 Self  albuterol (VENTOLIN HFA) 108 (90 Base) MCG/ACT inhaler [id number]  Inhale 2 puffs into the lungs every 6 (six) hours as needed for wheezing or shortness of breath. [name], Chris [name]  Active   allopurinol (ZYLOPRIM) 100 MG tablet [id number]  TAKE 2 TABLETS (200 MG TOTAL) BY MOUTH DAILY. Gerhard Munch, Chris [name]  Active   apixaban (ELIQUIS) 5 MG TABS tablet [id number]  Take 1 tablet (5 mg total) by mouth 2 (two) times daily. Laurey Morale, Chris [name]  Active   atorvastatin (LIPITOR) 40 MG tablet [id number]  Take 1 tablet (40 mg total) by mouth daily. Laurey Morale, Chris [name]  Active   Blood Glucose Monitoring Suppl (ACCU-CHEK GUIDE) w/Device Andria Rhein [id number] No Use as directed  Patient not taking: Reported on 12/07/2022   [name], Chris [name] Not Taking Active Self  budesonide-formoterol (SYMBICORT) 160-4.5 MCG/ACT inhaler [id number]  Inhale 2 puffs into the lungs 2 (two) times daily. [name], Chris [name]  Active   carvedilol (COREG) 12.5 MG tablet [id number]  Take 1 tablet (12.5 mg total) by  mouth 2 (two) times daily. Laurey Morale, Chris [name]  Active   dapagliflozin propanediol (FARXIGA) 10 MG TABS tablet [id number]  Take 1 tablet (10 mg total) by mouth daily. [name], Chris [name]  Active   glucose blood (ACCU-CHEK GUIDE) test strip [id number] No Use as directed to check blood sugar 1-2 times a day  Patient not taking: Reported on 12/07/2022   [name], Chris [name] Not Taking Active Self  HYDROcodone-acetaminophen (NORCO/VICODIN) 5-325 MG tablet [id number]  Take 1 tablet by mouth every 6 (six) hours as needed for severe pain. Gerhard Munch, Chris [name]  Active   hydrOXYzine (ATARAX) 25 MG tablet [id number] Yes Take 1 tablet (25 mg total) by mouth 3 (three) times daily as needed. Chris Pope, Chris [name]  Active   isosorbide-hydrALAZINE (BIDIL) 20-37.5 MG tablet [id number] No Take 1 tablet by mouth 3 (three) times daily.  Patient not taking: Reported on 12/07/2022   [name], Chris [name] Not Taking Active Self  Discontinued 07/11/20 [phone] (Discontinued by provider)   mirtazapine (REMERON) 15 MG tablet [id number] Yes Take 1 tablet (15 mg total) by mouth at bedtime. Chris Pope, Chris [name]  Active   potassium chloride (KLOR-CON M) 10 MEQ tablet [id number]  Take 2 tablets (20 mEq total) by mouth daily. Laurey Morale, Chris [name]  Active   Tiotropium Bromide Monohydrate (SPIRIVA RESPIMAT) 2.5 MCG/ACT AERS [id number]  Inhale 2 puffs into the lungs daily. [name], Chris [name]  Active   torsemide 40 MG TABS [id number]  Take 20 mg daily, alternating with 40 mg daily. Gerhard Munch, Chris [name]  Active             Patient Active Problem List   Diagnosis Date Noted   Post traumatic stress disorder 09/23/2022   MDD (major depressive disorder), recurrent episode, moderate (HCC) 08/26/2022   Stage 3b chronic kidney disease (HCC) 08/09/2022   Hypertrophic cardiomyopathy (HCC) 08/09/2022   History of renal cell cancer 08/09/2022   Chronic heart failure with preserved ejection fraction (HFpEF) (HCC) 07/13/2022   Agitation 04/23/2022    RSV (respiratory syncytial virus pneumonia) 04/22/2022   Obesity (BMI 30-39.9) 04/21/2022   COPD exacerbation (HCC) 04/19/2022   Dyslipidemia 04/19/2022   Gout 04/19/2022   Right kidney mass 05/14/2021   CHF exacerbation (HCC) 04/14/2021   Chest pain 04/14/2021   Syncope 04/14/2021   Left-sided weakness 10/28/2020   Typical atrial flutter (HCC)    CHF (congestive heart failure) (HCC) 07/04/2020   Acute exacerbation of CHF (congestive heart failure) (HCC) 06/16/2020   Influenza vaccine refused 05/06/2020   Acute decompensated heart failure (HCC) 05/04/2020   Illiteracy 05/04/2020   Type 2 diabetes mellitus with stage 3 chronic kidney disease (HCC) 12/25/2019   Elevated troponin I level 10/26/2019   History of gout 02/01/2019   Seasonal allergic rhinitis due to pollen 02/01/2019   Nicotine use disorder 11/30/2018   Microscopic hematuria 11/30/2018   Depression 11/30/2018   Difficulty controlling anger 11/30/2018   COPD (chronic obstructive pulmonary disease) (HCC)    CKD (chronic kidney disease) stage 3, GFR 30-59 ml/min (HCC) 08/10/2018   Recurrent epistaxis 04/21/2018   Mixed hyperlipidemia 07/28/2017   Essential hypertension 07/28/2017   Chronic systolic heart failure (HCC) 10/25/2014   Cocaine abuse (HCC) 02/20/2013   Cannabis abuse 02/20/2013  Back pain, chronic 02/20/2013    Conditions to be addressed/monitored per PCP order:  Depression  Care Plan : General Social Work (Adult)  Updates made by Gustavus Bryant, LCSW since 12/17/2022 12:00 AM     Problem: Depression Identification (Depression)      Long-Range Goal: To increase my self care and gain mental health support   Start Date: 07/08/2022  Priority: High  Note:   Timeframe:  Long-Range Goal Priority:  High Start Date:   07/08/22                    Expected End Date:ongoing    Follow Up Date- 01/17/23 at 330 pm   - begin personal counseling - call and visit an old friend - check out volunteer  opportunities - join a support group - laugh; watch a funny movie or comedian - learn and use visualization or guided imagery - perform a random act of kindness - practice relaxation or meditation daily - start or continue a personal journal - talk about feelings with a friend, family or spiritual advisor - practice positive thinking and self-talk    Why is this important?   When you are stressed, down or upset, your body reacts too.  For example, your blood pressure may get higher; you may have a headache or stomachache.  When your emotions get the best of you, your body's ability to fight off cold and flu gets weak.  These steps will help you manage your emotions.   Current Barriers:  Chronic Mental Health needs related to depression and need for housing Limited social support, Housing barriers, and Mental Health Concerns  Social Isolation ADL IADL limitations Suicidal Ideation/Homicidal Ideation: No  Clinical Social Work Goal(s):  Over the next 120 days, patient will work with LCSW monthly by telephone or in person to reduce or manage symptoms related to depression and relapse prevention patient will work with [name] to address needs related to financial, food and transportation support. Patient wishes to offset medical and housing expenses with financial support as housing resources are very limited at this time  Interventions: Patient interviewed and appropriate assessments performed: brief mental health assessment Discussed plans with patient for ongoing care management follow up and provided patient with direct contact information for care management team Assisted patient/caregiver with obtaining information about health plan benefits Patient was educated on the Advance Directives process. Patient was encouraged to complete document with PCP.  Advance Directive education was provided to patient again on 07/08/22 and 08/19/22 Encouraged patient to consider a mental health provider  for long term follow up and therapy/counseling and patient is now agreeable. Referral made to [facility] for both counseling and psychiatry.  Patient reports recent agitation due to his inability to meet his daily mental and physical needs. [facility] LCSW provided emotional support and reflective listening.  Solution-Focused Strategies, Mindfulness or Relaxation Training, Active listening / Reflection utilized , Emotional Supportive Provided, and Verbalization of feelings encouraged  Patient declines any current substance use. Patient has a history of cocaine abuse. Relapse prevention education provided.  Patient reports that he has developed social anxiety. He admits that he gets irritable at times and will snap at his loved ones and then will experience guilt afterwards. Educated patient on coping methods to implement into his daily life to combat anxiety symptoms and stress. Patient denied any current suicidal or homicidal ideations. Encouraged patient to implement deep breathing and grounding exercises into his daily routine due to ongoing anxiety and  SOB.  Patient reports that he will start implementing appropriate self-care habits into his daily routine such as: drinking water, staying active around the house, taking his medications as prescribed, combating negative thoughts or emotions and staying connected with his support network. Positive reinforcement provided.  [facility] LCSW made referral for [facility] [name] and [facility] RNCM involvement. Message sent to entire team by [facility] LCSW. 07/27/22 LCSW update- Patient and [facility] LCSW contacted [facility] together and scheduled him for both psychiatry and counseling next month. Patient's counseling is virtual so he will not need transportation assistance for this appointment only for psychiatry. [facility] LCSW will update [facility] [name]. [facility] LCSW had patient physically write down his appointment reminders as he is having issues with his memory. Self-care education provided. [facility] LCSW 08/19/22 update- Patient and  [facility] LCSW contacted [facility] together and left a message regarding his upcoming appointments as he has scheduled transportation for BOTH appointments even though of one those appointments is virtual. However, he has not filled out their intake paperwork and will need a ride to do this in person. [facility] LCSW encouraged patient to keep trying to reach [facility] in order to gain appropriate instructions on what to expect during his first visit. 08/27/22 update- Patient was able to successfully attend his psychiatry appointment yesterday. He was reminded of his upcoming appointments at [facility] for next month. He reports being in a lot of pain which has triggered his stress. Emotional support and resource education provided. LCSW update- Patient successfully went to [facility] for therapy and has a scheduled psychiatry visit next week. Patient reports that self care and stress management tools are not effectively working for him due to the amount of pain he is in. Care coordination completed with [facility] RNCM today. Update- Patient has upcoming initial therapy appointment on 5/16 at 3 pm and was advised to get there early to do their enrollment paperwork. He has an overlapping appointment that same day at 4:15 in [place] and was advised to let his care providers know. Crisis support resources reviewed as well. Update- Patient was provided positive feedback for attending cardiologist, PCP, counseling and psychiatry appointments over the last 30 days. Patient has follow up psychiatry appointment tomorrow and confirms stable transportation to this. He reports that his new medicines that his psychiatrist prescribes are helping especially regarding his sleep at night due to his chronic pain. Update- Patient is currently at the ED for back pain. He reports that he is in a lot of pain and it is triggering his mood management. Patient was having difficulty answering [facility] LCSW's questions due to his current state and [facility] LCSW advised him that she  will contact him on 12/17/22 to follow up on this ED visit. Brief pain management education and emotional support provided. LCSW update- Patient successfully attended psychiatry appt today at [facility]. He reports really liking his mental health providers and care management team and feels it has helped decrease his loneliness and depression. He reports that his mood management has improved as well. Brief self-care education provided for SOB/anxiety flare ups during the hot summer months.  09/15/21: [name] completed telephone outreach with patient for utility assistance. Patient stated that he has a cut off notice for 09/25/21 and his bill is $700.00. [name] informed patient he can go to the [facility] and can also [employer]. [name] provided patient with the telephone number for [employer]. Patient stated no other resources are needed at this time.   Patient Self Care  Activities:  Ability for insight Agreement to start mental health treatment and to the self-care journey   Patient Coping Strengths:  Self Advocate Able to Communicate Effectively  Patient Self Care Deficits:  Lacks social connections  The following coping skill education was provided for stress relief and mental health management: "When your car dies or a deadline looms, how do you respond? Long-term, low-grade or acute stress takes a serious toll on your body and mind, so don't ignore feelings of constant tension. Stress is a natural part of life. However, too much stress can harm our health, especially if it continues every day. This is chronic stress and can put you at risk for heart problems like heart disease and depression. Understand what's happening inside your body and learn simple coping skills to combat the negative impacts of everyday stressors.  Types of Stress There are two types of stress: Emotional - types of emotional stress are relationship problems, pressure at work, financial  worries, experiencing discrimination or having a major life change. Physical - Examples of physical stress include being sick having pain, not sleeping well, recovery from an injury or having an alcohol and drug use disorder. Fight or Flight Sudden or ongoing stress activates your nervous system and floods your bloodstream with adrenaline and cortisol, two hormones that raise blood pressure, increase heart rate and spike blood sugar. These changes pitch your body into a fight or flight response. That enabled our ancestors to outrun saber-toothed tigers, and it's helpful today for situations like dodging a car accident. But most modern chronic stressors, such as finances or a challenging relationship, keep your body in that heightened state, which hurts your health. Effects of Too Much Stress If constantly under stress, most of [place] will eventually start to function less well.  Multiple studies link chronic stress to a higher risk of heart disease, stroke, depression, weight gain, memory loss and even premature death, so it's important to recognize the warning signals. Talk to your doctor about ways to manage stress if you're experiencing any of these symptoms: Prolonged periods of poor sleep. Regular, severe headaches. Unexplained weight loss or gain. Feelings of isolation, withdrawal or worthlessness. Constant anger and irritability. Loss of interest in activities. Constant worrying or obsessive thinking. Excessive alcohol or drug use. Inability to concentrate.  10 Ways to Cope with Chronic Stress It's key to recognize stressful situations as they occur because it allows you to focus on managing how you react. We all need to know when to close our eyes and take a deep breath when we feel tension rising. Use these tips to prevent or reduce chronic stress. 1. Rebalance Work and Home All work and no play? If you're spending too much time at the office, intentionally put more dates in your calendar  to enjoy time for fun, either alone or with others. 2. Get Regular Exercise Moving your body on a regular basis balances the nervous system and increases blood circulation, helping to flush out stress hormones. Even a daily 20-minute walk makes a difference. Any kind of exercise can lower stress and improve your mood ? just pick activities that you enjoy and make it a regular habit. 3. Eat Well and Limit Alcohol and Stimulants Alcohol, nicotine and caffeine may temporarily relieve stress but have negative health impacts and can make stress worse in the long run. Well-nourished bodies cope better, so start with a good breakfast, add more organic fruits and vegetables for a well-balanced diet, avoid processed foods and sugar,  try herbal tea and drink more water. 4. Connect with Supportive People Talking face to face with another person releases hormones that reduce stress. Lean on those good listeners in your life. 5. Carve Out Hobby Time Do you enjoy gardening, reading, listening to music or some other creative pursuit? Engage in activities that bring you pleasure and joy; research shows that reduces stress by almost half and lowers your heart rate, too. 6. Practice Meditation, Stress Reduction or Yoga Relaxation techniques activate a state of restfulness that counterbalances your body's fight-or-flight hormones. Even if this also means a 10-minute break in a long day: listen to music, read, go for a walk in nature, do a hobby, take a bath or spend time with a friend. Also consider doing a mindfulness exercise or try a daily deep breathing or imagery practice. Deep Breathing Slow, calm and deep breathing can help you relax. Try these steps to focus on your breathing and repeat as needed. Find a comfortable position and close your eyes. Exhale and drop your shoulders. Breathe in through your nose; fill your lungs and then your belly. Think of relaxing your body, quieting your mind and becoming calm and  peaceful. Breathe out slowly through your nose, relaxing your belly. Think of releasing tension, pain, worries or distress. Repeat steps three and four until you feel relaxed. Imagery This involves using your mind to excite the senses -- sound, vision, smell, taste and feeling. This may help ease your stress. Begin by getting comfortable and then do some slow breathing. Imagine a place you love being at. It could be somewhere from your childhood, somewhere you vacationed or just a place in your imagination. Feel how it is to be in the place you're imagining. Pay attention to the sounds, air, colors, and who is there with you. This is a place where you feel cared for and loved. All is well. You are safe. Take in all the smells, sounds, tastes and feelings. As you do, feel your body being nourished and healed. Feel the calm that surrounds you. Breathe in all the good. Breathe out any discomfort or tension. 7. Sleep Enough If you get less than seven to eight hours of sleep, your body won't tolerate stress as well as it could. If stress keeps you up at night, address the cause, and add extra meditation into your day to make up for the lost z's. Try to get seven to nine hours of sleep each night. Make a regular bedtime schedule. Keep your room dark and cool. Try to avoid computers, TV, cell phones and tablets before bed. 8. Bond with Connections You Enjoy Go out for a coffee with a friend, chat with a neighbor, call a family member, visit with a clergy member, or even hang out with your pet. Clinical studies show that spending even a short time with a companion animal can cut anxiety levels almost in half. 9. Take a Vacation Getting away from it all can reset your stress tolerance by increasing your mental and emotional outlook, which makes you a happier, more productive person upon return. Leave your cellphone and laptop at home! 10. See a Counselor, Coach or Therapist If negative thoughts overwhelm  your ability to make positive changes, it's time to seek professional help. Make an appointment today--your health and life are worth it."     24- Hour Availability:    [facility]  [address] [place], [place] [phone] Crisis [phone]   [facility] Service  of the [facility] [phone]   [facility] Crisis Service  [phone]    [facility]  [phone] (after hours)   Therapeutic Alternative/Mobile Crisis   [phone]   [place] National Suicide Hotline  8591884460 Len Child[place]) [place] 253   Call 911 or go to emergency room   [facility]  [phone]);  [place] and [employer]  [phone]); [place], [place], [place], [place], [place], [place], [place]        Follow up goal      Follow up:  Patient agrees to Care Plan and Follow-up.  Plan: The Managed Medicaid care management team will reach out to the patient again over the next 30 days.  Dickie La, [name], MSW, [facility] Managed Medicaid LCSW [facility]  [facility] [email or web address] Phone: [phone]

## 2022-12-20 ENCOUNTER — Emergency Department (HOSPITAL_COMMUNITY)
Admission: EM | Admit: 2022-12-20 | Discharge: 2022-12-20 | Disposition: A | Discharge status: Home / Self Care | Payer: Medicaid Other | Attending: Emergency Medicine | Admitting: Emergency Medicine

## 2022-12-20 ENCOUNTER — Other Ambulatory Visit: Payer: Self-pay

## 2022-12-20 ENCOUNTER — Emergency Department (HOSPITAL_COMMUNITY): Payer: Medicaid Other

## 2022-12-20 DIAGNOSIS — F1410 Cocaine abuse, uncomplicated: Secondary | ICD-10-CM | POA: Diagnosis not present

## 2022-12-20 DIAGNOSIS — I2510 Atherosclerotic heart disease of native coronary artery without angina pectoris: Secondary | ICD-10-CM | POA: Diagnosis not present

## 2022-12-20 DIAGNOSIS — Z7901 Long term (current) use of anticoagulants: Secondary | ICD-10-CM | POA: Diagnosis not present

## 2022-12-20 DIAGNOSIS — G8929 Other chronic pain: Secondary | ICD-10-CM | POA: Diagnosis not present

## 2022-12-20 DIAGNOSIS — M545 Low back pain, unspecified: Secondary | ICD-10-CM | POA: Diagnosis not present

## 2022-12-20 DIAGNOSIS — I509 Heart failure, unspecified: Secondary | ICD-10-CM | POA: Diagnosis not present

## 2022-12-20 DIAGNOSIS — R7989 Other specified abnormal findings of blood chemistry: Secondary | ICD-10-CM | POA: Diagnosis not present

## 2022-12-20 DIAGNOSIS — R778 Other specified abnormalities of plasma proteins: Secondary | ICD-10-CM | POA: Diagnosis not present

## 2022-12-20 DIAGNOSIS — I517 Cardiomegaly: Secondary | ICD-10-CM | POA: Diagnosis not present

## 2022-12-20 DIAGNOSIS — R0789 Other chest pain: Secondary | ICD-10-CM | POA: Diagnosis not present

## 2022-12-20 DIAGNOSIS — R079 Chest pain, unspecified: Secondary | ICD-10-CM | POA: Diagnosis not present

## 2022-12-20 LAB — COMPREHENSIVE METABOLIC PANEL
ALT: 12 U/L (ref 0–44)
AST: 11 U/L — ABNORMAL LOW (ref 15–41)
Albumin: 3.4 g/dL — ABNORMAL LOW (ref 3.5–5.0)
Alkaline Phosphatase: 67 U/L (ref 38–126)
Anion gap: 10 (ref 5–15)
BUN: 29 mg/dL — ABNORMAL HIGH (ref 6–20)
CO2: 23 mmol/L (ref 22–32)
Calcium: 8.9 mg/dL (ref 8.9–10.3)
Chloride: 106 mmol/L (ref 98–111)
Creatinine, Ser: 2.15 mg/dL — ABNORMAL HIGH (ref 0.61–1.24)
GFR, Estimated: 34 mL/min — ABNORMAL LOW (ref >60)
Glucose, Bld: 90 mg/dL (ref 70–99)
Potassium: 4.1 mmol/L (ref 3.5–5.1)
Sodium: 139 mmol/L (ref 135–145)
Total Bilirubin: 0.9 mg/dL (ref 0.3–1.2)
Total Protein: 6.9 g/dL (ref 6.5–8.1)

## 2022-12-20 LAB — RAPID URINE DRUG SCREEN, HOSP PERFORMED
Amphetamines: NOT DETECTED
Barbiturates: NOT DETECTED
Benzodiazepines: NOT DETECTED
Cocaine: POSITIVE — AB
Opiates: NOT DETECTED
Tetrahydrocannabinol: NOT DETECTED

## 2022-12-20 LAB — CBC
HCT: 50.3 % (ref 39.0–52.0)
Hemoglobin: 15.8 g/dL (ref 13.0–17.0)
MCH: 29.5 pg (ref 26.0–34.0)
MCHC: 31.4 g/dL (ref 30.0–36.0)
MCV: 93.8 fL (ref 80.0–100.0)
Platelets: 171 10*3/uL (ref 150–400)
RBC: 5.36 MIL/uL (ref 4.22–5.81)
RDW: 15.2 % (ref 11.5–15.5)
WBC: 9.8 10*3/uL (ref 4.0–10.5)
nRBC: 0 % (ref 0.0–0.2)

## 2022-12-20 LAB — TROPONIN I (HIGH SENSITIVITY)
Troponin I (High Sensitivity): 36 ng/L — ABNORMAL HIGH (ref <18)
Troponin I (High Sensitivity): 38 ng/L — ABNORMAL HIGH (ref <18)

## 2022-12-20 LAB — BRAIN NATRIURETIC PEPTIDE: B Natriuretic Peptide: 1107.5 pg/mL — ABNORMAL HIGH (ref 0.0–100.0)

## 2022-12-20 MED ORDER — ASPIRIN 81 MG PO CHEW
324.0000 mg | CHEWABLE_TABLET | Freq: Once | ORAL | Status: AC
  Administered 2022-12-20: 324 mg via ORAL
  Filled 2022-12-20: qty 4

## 2022-12-20 MED ORDER — SODIUM CHLORIDE 0.9 % IV BOLUS
1000.0000 mL | Freq: Once | INTRAVENOUS | Status: AC
  Administered 2022-12-20: 1000 mL via INTRAVENOUS

## 2022-12-20 MED ORDER — METHYLPREDNISOLONE 4 MG PO TBPK: ORAL_TABLET | ORAL | 0 refills | Status: DC

## 2022-12-20 MED ORDER — BACLOFEN 10 MG PO TABS: 10.0000 mg | ORAL_TABLET | Freq: Three times a day (TID) | ORAL | 0 refills | Status: DC

## 2022-12-20 MED ORDER — METHYLPREDNISOLONE 4 MG PO TBPK
ORAL_TABLET | ORAL | 0 refills | Status: DC
  Filled 2022-12-20: qty 21, 6d supply, fill #0

## 2022-12-20 MED ORDER — TRAMADOL HCL 50 MG PO TABS
50.0000 mg | ORAL_TABLET | Freq: Four times a day (QID) | ORAL | 0 refills | Status: DC | PRN
  Filled 2022-12-20: qty 15, 4d supply, fill #0

## 2022-12-20 MED ORDER — FENTANYL CITRATE PF 50 MCG/ML IJ SOSY
50.0000 ug | PREFILLED_SYRINGE | Freq: Once | INTRAMUSCULAR | Status: AC
  Administered 2022-12-20: 50 ug via INTRAVENOUS
  Filled 2022-12-20: qty 1

## 2022-12-20 MED ORDER — TRAMADOL HCL 50 MG PO TABS: 50.0000 mg | ORAL_TABLET | Freq: Four times a day (QID) | ORAL | 0 refills | Status: DC | PRN

## 2022-12-20 MED ORDER — ONDANSETRON HCL 4 MG/2ML IJ SOLN
4.0000 mg | Freq: Once | INTRAMUSCULAR | Status: AC
  Administered 2022-12-20: 4 mg via INTRAVENOUS
  Filled 2022-12-20: qty 2

## 2022-12-20 MED ORDER — BACLOFEN 10 MG PO TABS
10.0000 mg | ORAL_TABLET | Freq: Three times a day (TID) | ORAL | 0 refills | Status: DC
  Filled 2022-12-20: qty 30, 10d supply, fill #0

## 2022-12-20 NOTE — ED Provider Notes (Signed)
[place] EMERGENCY DEPARTMENT AT [facility] Provider Note   CSN: [id number] Arrival date & time: 12/20/22  1478     History {Add pertinent medical, surgical, social history, OB history to HPI:1} Chief Complaint  Patient presents with   Chest Pain   Blurred Vision    Chris [name] is a 62 y.o. male who presents emergency department with a chief complaint of chest pain back pain and foot pain.  He complains of back pain and pain radiating down the right side of his leg.  He was seen for the same complaint on 12/09/2022 and reports that his pain has not improved.  He was diagnosed with sciatica at that time.  Also complaining of recurrent pain in his right first MTP consistent with history of gout which is causing him to limp on the right leg which is already very painful for him.  Patient also reports shortness of breath and chest pressure.  He has a history of previous cocaine abuse, CHF, known CAD.  Patient underwent a heart catheterization within the last month that showed 65% stenosis of the RCA.  He denies nausea vomiting or diaphoresis.   Chest Pain      Home Medications Prior to Admission medications   Medication Sig Start Date End Date Taking? Authorizing Provider  Accu-Chek Softclix Lancets lancets Use as instructed Patient not taking: Reported on 12/07/2022 04/28/22   [name], MD  acetaminophen (TYLENOL) 500 MG tablet Take 1-2 tablets (500-1,000 mg total) by mouth every 6 (six) hours as needed (pain.). 12/09/22   Gerhard Munch, MD  albuterol (PROVENTIL) (2.5 MG/3ML) 0.083% nebulizer solution Take 3 mLs (2.5 mg total) by nebulization every 4 (four) hours as needed for wheezing or shortness of breath. 10/20/22 11/19/22  [name], MD  albuterol (VENTOLIN HFA) 108 (90 Base) MCG/ACT inhaler Inhale 2 puffs into the lungs every 6 (six) hours as needed for wheezing or shortness of breath. 12/08/22   [name], MD  allopurinol (ZYLOPRIM) 100 MG  tablet TAKE 2 TABLETS (200 MG TOTAL) BY MOUTH DAILY. 12/09/22   Gerhard Munch, MD  apixaban (ELIQUIS) 5 MG TABS tablet Take 1 tablet (5 mg total) by mouth 2 (two) times daily. 12/08/22   Laurey Morale, MD  atorvastatin (LIPITOR) 40 MG tablet Take 1 tablet (40 mg total) by mouth daily. 12/08/22   Laurey Morale, MD  Blood Glucose Monitoring Suppl (ACCU-CHEK GUIDE) w/Device KIT Use as directed Patient not taking: Reported on 12/07/2022 04/28/22   [name], MD  budesonide-formoterol [facility]) 160-4.5 MCG/ACT inhaler Inhale 2 puffs into the lungs 2 (two) times daily. 12/08/22   [name], MD  carvedilol (COREG) 12.5 MG tablet Take 1 tablet (12.5 mg total) by mouth 2 (two) times daily. 12/08/22   Laurey Morale, MD  dapagliflozin propanediol (FARXIGA) 10 MG TABS tablet Take 1 tablet (10 mg total) by mouth daily. 12/08/22   [name], MD  glucose blood (ACCU-CHEK GUIDE) test strip Use as directed to check blood sugar 1-2 times a day Patient not taking: Reported on 12/07/2022 04/28/22   [name], MD  HYDROcodone-acetaminophen (NORCO/VICODIN) 5-325 MG tablet Take 1 tablet by mouth every 6 (six) hours as needed for severe pain. 12/09/22   Gerhard Munch, MD  hydrOXYzine (ATARAX) 25 MG tablet Take 1 tablet (25 mg total) by mouth 3 (three) times daily as needed. 12/17/22   Park Pope, MD  isosorbide-hydrALAZINE (BIDIL) 20-37.5 MG tablet Take 1  tablet by mouth 3 (three) times daily. Patient not taking: Reported on 12/07/2022 08/03/22   [name], MD  mirtazapine (REMERON) 15 MG tablet Take 1 tablet (15 mg total) by mouth at bedtime. 12/17/22 02/15/23  Park Pope, MD  potassium chloride (KLOR-CON M) 10 MEQ tablet Take 2 tablets (20 mEq total) by mouth daily. 12/08/22   Laurey Morale, MD  Tiotropium Bromide Monohydrate (SPIRIVA RESPIMAT) 2.5 MCG/ACT AERS Inhale 2 puffs into the lungs daily. 12/08/22   [name], MD  torsemide 40 MG TABS Take 20 mg daily,  alternating with 40 mg daily. 12/09/22   Gerhard Munch, MD  metoprolol tartrate (LOPRESSOR) 100 MG tablet Take 1 tablet (100 mg total) by mouth 2 (two) times daily. 07/09/20 07/11/20  Marrion Coy, MD      Allergies    Patient has no known allergies.    Review of Systems   Review of Systems  Cardiovascular:  Positive for chest pain.    Physical Exam Updated Vital Signs BP (!) 152/121   Pulse 70   Temp 97.9 F (36.6 C) (Oral)   Resp 16   SpO2 95%  Physical Exam Vitals and nursing note reviewed.  Constitutional:      General: He is not in acute distress.    Appearance: He is well-developed. He is not diaphoretic.  HENT:     Head: Normocephalic and atraumatic.  Eyes:     General: No scleral icterus.    Conjunctiva/sclera: Conjunctivae normal.  Cardiovascular:     Rate and Rhythm: Normal rate and regular rhythm.     Heart sounds: Normal heart sounds.  Pulmonary:     Effort: Pulmonary effort is normal. No respiratory distress.     Breath sounds: Examination of the right-lower field reveals rales. Examination of the left-lower field reveals rales. Rhonchi and rales present.  Abdominal:     Palpations: Abdomen is soft.     Tenderness: There is no abdominal tenderness.  Musculoskeletal:     Cervical back: Normal range of motion and neck supple.     Right lower leg: No edema.     Left lower leg: No edema.  Skin:    General: Skin is warm and dry.  Neurological:     Mental Status: He is alert.  Psychiatric:        Behavior: Behavior normal.     ED Results / Procedures / Treatments   Labs (all labs ordered are listed, but only abnormal results are displayed) Labs Reviewed  CBC  COMPREHENSIVE METABOLIC PANEL  RAPID URINE DRUG SCREEN, HOSP PERFORMED  TROPONIN I (HIGH SENSITIVITY)    EKG None  Radiology No results found.  Procedures Procedures  {Document cardiac monitor, telemetry assessment procedure when appropriate:1}  Medications Ordered in ED Medications   aspirin chewable tablet 324 mg (324 mg Oral Given 12/20/22 1008)    ED Course/ Medical Decision Making/ A&P   {   Click here for ABCD2, HEART and other calculatorsREFRESH Note before signing :1}                          Medical Decision Making Amount and/or Complexity of Data Reviewed Labs: ordered. Radiology: ordered.  Risk OTC drugs.   ***  {Document critical care time when appropriate:1} {Document review of labs and clinical decision tools ie heart score, Chads2Vasc2 etc:1}  {Document your independent review of radiology images, and any outside records:1} {Document your discussion with family members, caretakers, and  with consultants:1} {Document social determinants of health affecting pt's care:1} {Document your decision making why or why not admission, treatments were needed:1} Final Clinical Impression(s) / ED Diagnoses Final diagnoses:  None    Rx / DC Orders ED Discharge Orders     None

## 2022-12-20 NOTE — Discharge Instructions (Signed)
SEEK IMMEDIATE MEDICAL ATTENTION IF: New numbness, tingling, weakness, or problem with the use of your arms or legs.  Severe back pain not relieved with medications.  Change in bowel or bladder control.  Increasing pain in any areas of the body (such as chest or abdominal pain).  Shortness of breath, dizziness or fainting.  Nausea (feeling sick to your stomach), vomiting, fever, or sweats.  

## 2022-12-20 NOTE — ED Triage Notes (Addendum)
Pt stated, Ive had chest pain and blurred vision for a couple days/ This has gotten worse in the last 2 days I also have sciatic pain I have also tingling on both feet, and gout

## 2022-12-21 ENCOUNTER — Telehealth: Payer: Self-pay | Admitting: *Deleted

## 2022-12-21 NOTE — Transitions of Care (Post Inpatient/ED Visit) (Signed)
   12/21/2022  Name: Chris [name] MRN: [id number] DOB: 1960-08-19  Today's TOC FU Call Status: Today's TOC FU Call Status:: Unsuccessul Call (1st Attempt) Unsuccessful Call (1st Attempt) Date: 12/21/22  Attempted to reach the patient regarding the most recent Inpatient/ED visit.  Follow Up Plan: Additional outreach attempts will be made to reach the patient to complete the Transitions of Care (Post Inpatient/ED visit) call.   Estanislado Emms RN, BSN [place]  Managed [facility] RN Care Coordinator [phone]

## 2022-12-22 ENCOUNTER — Ambulatory Visit (HOSPITAL_BASED_OUTPATIENT_CLINIC_OR_DEPARTMENT_OTHER): Payer: Medicaid Other | Admitting: Cardiology

## 2022-12-22 ENCOUNTER — Other Ambulatory Visit
Admission: RE | Admit: 2022-12-22 | Discharge: 2022-12-22 | Disposition: A | Discharge status: Home / Self Care | Payer: Medicaid Other | Source: Ambulatory Visit | Attending: Cardiology | Admitting: Cardiology

## 2022-12-22 VITALS — BP 121/80 | HR 84 | Ht 72.0 in | Wt 245.0 lb

## 2022-12-22 DIAGNOSIS — E1122 Type 2 diabetes mellitus with diabetic chronic kidney disease: Secondary | ICD-10-CM | POA: Insufficient documentation

## 2022-12-22 DIAGNOSIS — N183 Chronic kidney disease, stage 3 unspecified: Secondary | ICD-10-CM | POA: Insufficient documentation

## 2022-12-22 DIAGNOSIS — Z7984 Long term (current) use of oral hypoglycemic drugs: Secondary | ICD-10-CM | POA: Diagnosis not present

## 2022-12-22 DIAGNOSIS — I4892 Unspecified atrial flutter: Secondary | ICD-10-CM | POA: Insufficient documentation

## 2022-12-22 DIAGNOSIS — I5032 Chronic diastolic (congestive) heart failure: Secondary | ICD-10-CM

## 2022-12-22 DIAGNOSIS — I422 Other hypertrophic cardiomyopathy: Secondary | ICD-10-CM | POA: Diagnosis not present

## 2022-12-22 DIAGNOSIS — I2510 Atherosclerotic heart disease of native coronary artery without angina pectoris: Secondary | ICD-10-CM | POA: Diagnosis not present

## 2022-12-22 DIAGNOSIS — F17210 Nicotine dependence, cigarettes, uncomplicated: Secondary | ICD-10-CM | POA: Diagnosis not present

## 2022-12-22 DIAGNOSIS — Z7901 Long term (current) use of anticoagulants: Secondary | ICD-10-CM | POA: Insufficient documentation

## 2022-12-22 DIAGNOSIS — Z5986 Financial insecurity: Secondary | ICD-10-CM | POA: Diagnosis not present

## 2022-12-22 DIAGNOSIS — J449 Chronic obstructive pulmonary disease, unspecified: Secondary | ICD-10-CM | POA: Insufficient documentation

## 2022-12-22 DIAGNOSIS — Z8249 Family history of ischemic heart disease and other diseases of the circulatory system: Secondary | ICD-10-CM | POA: Insufficient documentation

## 2022-12-22 DIAGNOSIS — Z833 Family history of diabetes mellitus: Secondary | ICD-10-CM | POA: Insufficient documentation

## 2022-12-22 DIAGNOSIS — I130 Hypertensive heart and chronic kidney disease with heart failure and stage 1 through stage 4 chronic kidney disease, or unspecified chronic kidney disease: Secondary | ICD-10-CM | POA: Insufficient documentation

## 2022-12-22 DIAGNOSIS — Z5982 Transportation insecurity: Secondary | ICD-10-CM | POA: Diagnosis not present

## 2022-12-22 LAB — LIPID PANEL
Cholesterol: 181 mg/dL (ref 0–200)
HDL: 27 mg/dL — ABNORMAL LOW (ref >40)
LDL Cholesterol: 125 mg/dL — ABNORMAL HIGH (ref 0–99)
Total CHOL/HDL Ratio: 6.7 RATIO
Triglycerides: 146 mg/dL (ref <150)
VLDL: 29 mg/dL (ref 0–40)

## 2022-12-22 NOTE — Patient Instructions (Signed)
Labs done today, your results will be available in MyChart, we will contact you for abnormal readings.   Testing/Procedures:  Your were already scheduled for the following appointments:     01/31/2023 Status:    Time: 12:00 PM    Visit Type: CONSULT [1001]    Provider: Mealor, Roberts Gaudy, MD Department: [facility]  Referring Provider: Laurey Morale    Notes: consult/np for ICD per Dr. Shirlee Latch            Time: 10:00 AM    Visit Type: GEN COUNSEL 60 [952]    Provider: Glennon Mac, PhD     Special Instructions // Education:  Do the following things EVERYDAY: Weigh yourself in the morning before breakfast. Write it down and keep it in a log. Take your medicines as prescribed Eat low salt foods--Limit salt (sodium) to 2000 mg per day.  Stay as active as you can everyday Limit all fluids for the day to less than 2 liters   Follow-Up in: follow in 3 months with Dr. Shirlee Latch.     If you have any questions or concerns before your next appointment please send [place] a message through [place] or call our office at [phone] Monday-Friday 8 am-5 pm.   If you have an urgent need after hours on the weekend please call your Primary Cardiologist or the Advanced [facility] in [place] at [phone].

## 2022-12-22 NOTE — Progress Notes (Signed)
PCP: [name], MD HF Cardiology: Dr. Shirlee Latch  62 y.o. with history of HFpEF, CKD stage 3, possible hypertrophic cardiomyopathy, renal cell CA s/p ablation, and atrial flutter s/p 1/22 DCCV who was referred to CHF MD clinic by [facility].  Patient was admitted in 1/22 with CHF and found to be in atrial flutter.  Echo showed EF 25% and he was cardioverted to NSR.  He is no longer anticoagulated. Cardiolite in 2/23 showed no ischemia, fixed inferior defect.  Echo in 8/23 showed EF 50% with severe asymmetric septal hypertrophy but no SAM or LVOT gradient.  Cardiac MRI in 12/23 was similar with LV EF 50%, severe asymmetric septal hypertrophy, normal RV, LGE in the basal septum. These studies were concerning for hypertrophic cardiomyopathy.  Patient is an active smoker and used to use cocaine.  He carries history of COPD.  Grandfather had sudden cardiac death at work ("fell out dead").   RHC in 2022-07-28 showed elevated filling pressures, CI 2.25, PVR 3.9 WU.  Torsemide was increased.    Patient was seen in the ER in 2/6 with abdominal pain, nausea/vomiting.  At that time, creatinine was up to 2.54.   Zio monitor done 2/24 for episodes of ?syncope showed no AF/AFL, 1.5 % PVCs, 6 short NSVT runs (longest was 7 beats).   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  PKP2 gene is associated with ARVC and Brugada, but the clinical significance of the mutation found in this patient is uncertain (not reported in literature).  Echo in 5/24 showed EF 45-50%, moderate asymmetric septal hypertrophy with no LVOT gradient of mitral valve SAM, RV normal, mild-moderate MR, IVC normal.   LHC/RHC was done in 6/24, showing 65% proximal RCA stenosis, normal filling pressures but low CI at 1.81.   Patient was seen in the ER this  month due to severe low back pain.  UDS was positive for cocaine.  He says this is the first time in years he has used it, due to stress from court case and back pain.   He returns today for followup of CHF, ?HCM.  Weight is down 17 lbs.  Breathing is "up and down." He is doing some walking for exercise and says he can get 2 miles on flat ground before wearing out.  No chest pain.  No further syncope and no lightheadedness.  Still smoking 3-4 cigarettes/day.   ECG (personally reviewed): NSR, LVH  Labs July 28, 2022): BNP 873, K 3.6, creatinine 1.7 Labs (2/24): K 4, creatinine 2.54 Labs (4/24): K 4.2, creatinine 1.75, BNP 280, hgb 14.4 Labs (7/24): K 4.1, creatinine 2.15, BNP 1107  PMH: 1. Renal cell carcinoma on right: s/p ablation 07/28/22.  2. HF with mid range EF: ?HCM, ?cardiomyopathy related to cocaine in the past.  EF low in the past, echo in 1/22 with EF 22%.   - Cardiolite (2/23): Fixed inferior defect, ?artifact.  - Echo (8/23): EF 50%, severe asymmetric septal hypertrophy, no SAM/MR, no LVOT gradient, RV normal.  - Cardiac MRI (12/23): LV EF 50%, severe asymmetric septal hypertrophy, normal RV, LGE in the basal septum.  Possible hypertrophic cardiomyopathy.  - RHC 07-28-2022): mean RA 15, PA 50/31 mean 42, mean PCWP 21, CI 2.25, PVR 3.9 WU, PAPi 1.3 - Zio monitor done 2/24 showed no AF/AFL, 1.5 %  PVCs, 6 short NSVT runs (longest was 7 beats).  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  PKP2 gene is associated with ARVC and Brugada, but the clinical significance of the mutation found in this patient is uncertain (not reported in literature). - Echo (5/24): EF 45-50%, moderate asymmetric septal hypertrophy with no LVOT gradient of mitral valve SAM, RV normal, mild-moderate MR, IVC normal. - [facility] (6/24): 65%  proximal RCA stenosis; mean RA 5, PA 32/12 mean 24, mean PCWP 7,CI 1.81. 3. Atrial flutter: DCCV 1/22.   4. Type 2 diabetes.  5. HTN 6. CKD stage 3 7. Gout 8. Depression 9. COPD: Active smoker.  10. Prior cocaine abuse.   Social History   Socioeconomic History   Marital status: Divorced    Spouse name: Not on file   Number of children: 3   Years of education: Not on file   Highest education level: High school graduate  Occupational History   Occupation: disability  Tobacco Use   Smoking status: Every Day    Packs/day: 1.00    Years: 43.00    Additional pack years: 0.00    Total pack years: 43.00    Types: Cigarettes   Smokeless tobacco: Never   Tobacco comments:        4 cigs daily--05/12/2022  Vaping Use   Vaping Use: Never used  Substance and Sexual Activity   Alcohol use: Yes    Alcohol/week: 4.0 standard drinks of alcohol    Types: 4 Shots of liquor per week   Drug use: Yes    Frequency: 21.0 times per week    Types: Marijuana    Comment: last use Cocaine- 03/28/2021. Still using marijuana, last use 06/22/21   Sexual activity: Not on file  Other Topics Concern   Not on file  Social History Narrative   ** Merged History Encounter **       Social Determinants of Health   Financial Resource Strain: Medium Risk (07/23/2022)   Overall Financial Resource Strain (CARDIA)    Difficulty of Paying Living Expenses: Somewhat hard  Food Insecurity: No Food Insecurity (04/20/2022)   Hunger Vital Sign    Worried About Running Out of Food in the Last Year: Never true    Ran Out of Food in the Last Year: Never true  Transportation Needs: Unmet Transportation Needs (10/20/2022)   PRAPARE - Administrator, Civil Service (Medical): Yes    Lack of Transportation (Non-Medical): Yes  Physical Activity: Insufficiently Active (11/16/2021)   Exercise Vital Sign    Days of Exercise per Week: 5 days    Minutes of Exercise per Session: 20 min  Stress: No Stress Concern  Present (12/17/2022)   [employer] of Occupational Health - Occupational Stress Questionnaire    Feeling of Stress : Only a little  Recent Concern: Stress - Stress Concern Present (11/10/2022)   [employer] of Occupational Health - Occupational Stress Questionnaire    Feeling of Stress : To some extent  Social Connections: Moderately Isolated (07/23/2022)   Social Connection and Isolation Panel [NHANES]    Frequency of Communication with Friends and Family: Twice a week    Frequency of Social Gatherings with Friends and Family: Once a week    Attends Religious Services: More than 4 times per  year    Active Member of Clubs or Organizations: No    Attends Banker Meetings: Never    Marital Status: Divorced  Catering manager Violence: Not At Risk (04/20/2022)   Humiliation, Afraid, Rape, and Kick questionnaire    Fear of Current or Ex-Partner: No    Emotionally Abused: No    Physically Abused: No    Sexually Abused: No   Family History  Problem Relation Age of Onset   Heart disease Father    Diabetes Mother    HIV Brother    Healthy Son    Healthy Daughter    ROS: All systems reviewed and negative except as per HPI.   Current Outpatient Medications  Medication Sig Dispense Refill   acetaminophen (TYLENOL) 500 MG tablet Take 1-2 tablets (500-1,000 mg total) by mouth every 6 (six) hours as needed (pain.). 30 tablet 0   albuterol (VENTOLIN HFA) 108 (90 Base) MCG/ACT inhaler Inhale 2 puffs into the lungs every 6 (six) hours as needed for wheezing or shortness of breath. 54 g 6   allopurinol (ZYLOPRIM) 100 MG tablet TAKE 2 TABLETS (200 MG TOTAL) BY MOUTH DAILY. 60 tablet 0   apixaban (ELIQUIS) 5 MG TABS tablet Take 1 tablet (5 mg total) by mouth 2 (two) times daily. 60 tablet 11   atorvastatin (LIPITOR) 40 MG tablet Take 1 tablet (40 mg total) by mouth daily. 90 tablet 3   baclofen (LIORESAL) 10 MG tablet Take 1 tablet (10 mg total) by mouth 3 (three) times daily. 30  each 0   budesonide-formoterol (SYMBICORT) 160-4.5 MCG/ACT inhaler Inhale 2 puffs into the lungs 2 (two) times daily. 30.6 g 6   carvedilol (COREG) 12.5 MG tablet Take 1 tablet (12.5 mg total) by mouth 2 (two) times daily. 180 tablet 3   dapagliflozin propanediol (FARXIGA) 10 MG TABS tablet Take 1 tablet (10 mg total) by mouth daily. 90 tablet 3   hydrOXYzine (ATARAX) 25 MG tablet Take 1 tablet (25 mg total) by mouth 3 (three) times daily as needed. 60 tablet 1   isosorbide-hydrALAZINE (BIDIL) 20-37.5 MG tablet Take 1 tablet by mouth 3 (three) times daily. 270 tablet 3   mirtazapine (REMERON) 15 MG tablet Take 1 tablet (15 mg total) by mouth at bedtime. 30 tablet 1   potassium chloride (KLOR-CON M) 10 MEQ tablet Take 2 tablets (20 mEq total) by mouth daily. 180 tablet 3   torsemide 40 MG TABS Take 20 mg daily, alternating with 40 mg daily. 30 tablet 0   traMADol (ULTRAM) 50 MG tablet Take 1 tablet (50 mg total) by mouth every 6 (six) hours as needed. 15 tablet 0   Accu-Chek Softclix Lancets lancets Use as instructed (Patient not taking: Reported on 12/07/2022) 100 each 12   albuterol (PROVENTIL) (2.5 MG/3ML) 0.083% nebulizer solution Take 3 mLs (2.5 mg total) by nebulization every 4 (four) hours as needed for wheezing or shortness of breath. 300 mL 0   Blood Glucose Monitoring Suppl (ACCU-CHEK GUIDE) w/Device KIT Use as directed (Patient not taking: Reported on 12/07/2022) 1 kit 0   glucose blood (ACCU-CHEK GUIDE) test strip Use as directed to check blood sugar 1-2 times a day (Patient not taking: Reported on 12/07/2022) 100 each 12   HYDROcodone-acetaminophen (NORCO/VICODIN) 5-325 MG tablet Take 1 tablet by mouth every 6 (six) hours as needed for severe pain. (Patient not taking: Reported on 12/22/2022) 10 tablet 0   methylPREDNISolone (MEDROL DOSEPAK) 4 MG TBPK tablet Use as directed (Patient not  taking: Reported on 12/22/2022) 21 tablet 0   Tiotropium Bromide Monohydrate (SPIRIVA RESPIMAT) 2.5 MCG/ACT  AERS Inhale 2 puffs into the lungs daily. 4 g 6   No current facility-administered medications for this visit.   BP 121/80   Pulse 84   Ht 6' (1.829 m)   Wt 245 lb (111.1 kg)   SpO2 97%   BMI 33.23 kg/m  General: NAD Neck: No JVD, no thyromegaly or thyroid nodule.  Lungs: Clear to auscultation bilaterally with normal respiratory effort. CV: Nondisplaced PMI.  Heart regular S1/S2, no S3/S4, no murmur.  No peripheral edema.  No carotid bruit.  Normal pedal pulses.  Abdomen: Soft, nontender, no hepatosplenomegaly, no distention.  Skin: Intact without lesions or rashes.  Neurologic: Alert and oriented x 3.  Psych: Normal affect. Extremities: No clubbing or cyanosis.  HEENT: Normal.   1. Chronic diastolic CHF/?hypertrophic cardiomyopathy: Prior history of HFrEF that may have been tachycardia-mediated in setting of atrial flutter in 1/22 vs cocaine-related, now EF has recovered to 50% on last study. Cardiolite in 2/23 showed no ischemia, fixed inferior defect.  Echo in 8/23 showed EF 50% with severe asymmetric septal hypertrophy but no SAM or LVOT gradient.  Cardiac MRI in 12/23 was similar with LV EF 50%, severe asymmetric septal hypertrophy, normal RV, LGE in the basal septum. These studies were concerning for hypertrophic cardiomyopathy.  RHC in 1/24 showed elevated filling pressures with CI 2.25.  His grandfather may have had sudden cardiac death at work, father died at 22 from "heart disease."  He has had syncopal episodes that may have been orthostatic, but I worried about ventricular arrhythmias given possible HCM and LGE noted on cardiac MRI. Zio monitor in 2/24 showed 1.5% PVCs and 6 short NSVT runs (longest 7 beats).  Nothing to explain syncope and has had no further episodes. 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 MYBPC3 gene is associated with HCM but the clinical significance of the mutation found in this patient  is uncertain (not reported in literature).  PKP2 gene is associated with ARVC and Brugada, but the clinical significance of the mutation found in this patient is uncertain (not reported in literature).  Echo in 5/24 showed EF 45-50%, moderate asymmetric septal hypertrophy with no LVOT gradient of mitral valve SAM, RV normal, mild-moderate MR, IVC normal. Cath in 6/24 showed nonobstructive CAD, normal filling pressures, low CI.  Stable NYHA class II-III, not volume overloaded on exam. 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  - Has appointment with Sidney Ace for genetic counseling regarding the mutations of uncertain significance.  - With no LVOT obstruction or significant murmur, ok to continue Bidil.  - Continue Coreg 12.5 mg bid.  - I will continue torsemide 40 mg daily alternating with 20 mg daily, BMET/BNP today..  - Continue dapagliflozin 10 mg daily.  - We need to consider an ICD with suspected HCM and some higher risk markers, though somewhat borderline. I would like him to see Sidney Ace first.  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  He has had short NSVT runs on 1 week monitor (longest 7 beats).  No LVOT obstruction.  EF is mildly decreased at 45-50%.  He has had syncope but not sure it was arrhythmic (had 1 syncopal episode while wearing monitor and no corresponding arrhythmic event).  Grandfather likely had SCD.  He has appointment with EP  for ICD evaluation.  2. CKD stage 3: BMET today.   3. COPD: Patient is active smoker and carries this history.  I strongly encouraged him to quit smoking today.  He is smoking much less than before.   4. Atrial flutter: S/p DCCV in 1/22.  NSR today.  - Continue apixaban.  5. Cocaine abuse: Patient had a relapse earlier this month in setting of stress from court case and back pain. He says that he used once and will not use again.  6. 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  - Check lipids today, continue statin.    Followup in 3 months.   Marca Ancona 12/22/2022

## 2022-12-23 ENCOUNTER — Telehealth: Payer: Self-pay

## 2022-12-23 ENCOUNTER — Telehealth: Payer: Self-pay | Admitting: *Deleted

## 2022-12-23 ENCOUNTER — Encounter (HOSPITAL_COMMUNITY): Payer: Self-pay

## 2022-12-23 ENCOUNTER — Telehealth (HOSPITAL_COMMUNITY): Payer: Self-pay | Admitting: Mental Health

## 2022-12-23 ENCOUNTER — Ambulatory Visit (HOSPITAL_COMMUNITY): Payer: Medicaid Other | Admitting: Mental Health

## 2022-12-23 DIAGNOSIS — I5032 Chronic diastolic (congestive) heart failure: Secondary | ICD-10-CM

## 2022-12-23 MED ORDER — ATORVASTATIN CALCIUM 80 MG PO TABS: 80.0000 mg | ORAL_TABLET | Freq: Every day | ORAL | 1 refills | Status: DC

## 2022-12-23 NOTE — Transitions of Care (Post Inpatient/ED Visit) (Signed)
12/23/2022  Name: Chris [name] MRN: [id number] DOB: 12-26-1960  Today's TOC FU Call Status: Today's TOC FU Call Status:: Successful TOC FU Call Competed TOC FU Call Complete Date: 12/23/22  Transition Care Management Follow-up Telephone Call Date of Discharge: 12/20/22 Discharge Facility: [facility] Type of Discharge: Emergency Department Reason for ED Visit: Other: (chest pain, blurred vision) How have you been since you were released from the hospital?: Better Any questions or concerns?: No  Items Reviewed: Did you receive and understand the discharge instructions provided?: Yes Medications obtained,verified, and reconciled?: No (Patient is resting and unable to review medications) Dietary orders reviewed?: NA Do you have support at home?: No  Medications Reviewed Today: Medications Reviewed Today     Reviewed by Chrystine Oiler, CMA (Certified Medical Assistant) on 12/22/22 at 1157  Med List Status: <None>   Medication Order Taking? Sig Documenting Provider Last Dose Status Informant  Accu-Chek Softclix Lancets lancets [id number]  Use as instructed  Patient not taking: Reported on 12/07/2022   [name], MD  Active Self  acetaminophen (TYLENOL) 500 MG tablet [id number] Yes Take 1-2 tablets (500-1,000 mg total) by mouth every 6 (six) hours as needed (pain.). Gerhard Munch, MD Taking Active   albuterol (PROVENTIL) (2.5 MG/3ML) 0.083% nebulizer solution [id number]  Take 3 mLs (2.5 mg total) by nebulization every 4 (four) hours as needed for wheezing or shortness of breath. [name], MD  Expired 11/19/22 2359 Self  albuterol (VENTOLIN HFA) 108 (90 Base) MCG/ACT inhaler [id number] Yes Inhale 2 puffs into the lungs every 6 (six) hours as needed for wheezing or shortness of breath. [name], MD Taking Active   allopurinol (ZYLOPRIM) 100 MG tablet [id number] Yes TAKE 2 TABLETS (200 MG TOTAL) BY MOUTH DAILY. Gerhard Munch, MD Taking Active    apixaban (ELIQUIS) 5 MG TABS tablet [id number] Yes Take 1 tablet (5 mg total) by mouth 2 (two) times daily. Laurey Morale, MD Taking Active   atorvastatin (LIPITOR) 40 MG tablet [id number] Yes Take 1 tablet (40 mg total) by mouth daily. Laurey Morale, MD Taking Active   baclofen (LIORESAL) 10 MG tablet [id number] Yes Take 1 tablet (10 mg total) by mouth 3 (three) times daily. Arthor Captain, PA-C Taking Active   Blood Glucose Monitoring Suppl (ACCU-CHEK GUIDE) w/Device Andria Rhein [id number]  Use as directed  Patient not taking: Reported on 12/07/2022   [name], MD  Active Self  budesonide-formoterol [facility]) 160-4.5 MCG/ACT inhaler [id number] Yes Inhale 2 puffs into the lungs 2 (two) times daily. [name], MD Taking Active   carvedilol (COREG) 12.5 MG tablet [id number] Yes Take 1 tablet (12.5 mg total) by mouth 2 (two) times daily. Laurey Morale, MD Taking Active   dapagliflozin propanediol (FARXIGA) 10 MG TABS tablet [id number] Yes Take 1 tablet (10 mg total) by mouth daily. [name], MD Taking Active   glucose blood (ACCU-CHEK GUIDE) test strip [id number]  Use as directed to check blood sugar 1-2 times a day  Patient not taking: Reported on 12/07/2022   [name], MD  Active Self  HYDROcodone-acetaminophen (NORCO/VICODIN) 5-325 MG tablet [id number] No Take 1 tablet by mouth every 6 (six) hours as needed for severe pain.  Patient not taking: Reported on 12/22/2022   Gerhard Munch, MD Not Taking Active   hydrOXYzine (ATARAX) 25 MG tablet [id number] Yes Take 1 tablet (25 mg total) by mouth 3 (three) times daily as needed. Park Pope, MD Taking Active  isosorbide-hydrALAZINE (BIDIL) 20-37.5 MG tablet [id number] Yes Take 1 tablet by mouth 3 (three) times daily. [name], MD Taking Active Self  methylPREDNISolone (MEDROL DOSEPAK) 4 MG TBPK tablet [id number] No Use as directed  Patient not taking: Reported on 12/22/2022   Arthor Captain, PA-C Not Taking  Active     Discontinued 07/11/20 0943 (Discontinued by provider)   mirtazapine (REMERON) 15 MG tablet [id number] Yes Take 1 tablet (15 mg total) by mouth at bedtime. Park Pope, MD Taking Active   potassium chloride (KLOR-CON M) 10 MEQ tablet [id number] Yes Take 2 tablets (20 mEq total) by mouth daily. Laurey Morale, MD Taking Active   Tiotropium Bromide Monohydrate (SPIRIVA RESPIMAT) 2.5 MCG/ACT AERS [id number]  Inhale 2 puffs into the lungs daily. [name], MD  Active   torsemide 40 MG TABS [id number] Yes Take 20 mg daily, alternating with 40 mg daily. Gerhard Munch, MD Taking Active   traMADol Janean Sark) 50 MG tablet [id number] Yes Take 1 tablet (50 mg total) by mouth every 6 (six) hours as needed. Arthor Captain, PA-C Taking Active             Home Care and Equipment/Supplies: Were Home Health Services Ordered?: No Any new equipment or medical supplies ordered?: No  Functional Questionnaire: Do you need assistance with bathing/showering or dressing?: No Do you need assistance with meal preparation?: No Do you need assistance with eating?: No Do you have difficulty maintaining continence: No Do you need assistance with getting out of bed/getting out of a chair/moving?: No Do you have difficulty managing or taking your medications?: No  Follow up appointments reviewed: PCP Follow-up appointment confirmed?: Yes Date of PCP follow-up appointment?: 01/05/23 Specialist Hospital Follow-up appointment confirmed?: Yes Date of Specialist follow-up appointment?: 12/22/22 Follow-Up Specialty Provider:: Dr. Shirlee Latch Do you need transportation to your follow-up appointment?: No Do you understand care options if your condition(s) worsen?: Yes-patient verbalized understanding  SDOH Interventions Today    Flowsheet Row Most Recent Value  SDOH Interventions   Transportation Interventions Payor Benefit       Estanislado Emms RN, BSN [place]  Managed [facility] RN Care  Coordinator [phone]

## 2022-12-23 NOTE — Progress Notes (Signed)
Per Dr. Shirlee Latch, Increase atorvastatin to 80 mg daily with lipids/LFTs in 2 months.   Called and spoke w/ pt about lab results. Pt had good understanding and no further questions. Lab order placed. New Rx ([facility]atin) [facility].

## 2022-12-23 NOTE — Telephone Encounter (Signed)
Therapist sent link for tele-therapy session x 2. No response. Contacted via telephone; straight to voicemail; left message. NS

## 2022-12-27 ENCOUNTER — Other Ambulatory Visit: Payer: Medicaid Other

## 2022-12-27 NOTE — Patient Instructions (Signed)
Visit Information  Mr. Ridenhour was given information about Medicaid Managed Care team care coordination services as a part of their Healthy [facility] Medicaid benefit. GARELD BLAES verbally consented to engagement with the [facility] Managed Care team.   If you are experiencing a medical emergency, please call 911 or report to your local emergency department or urgent care.   If you have a non-emergency medical problem during routine business hours, please contact your provider's office and ask to speak with a nurse.   For questions related to your Healthy [facility] health plan, please call: [phone] or visit the homepage here: [email or web address]  If you would like to schedule transportation through your Healthy [facility] plan, please call the following number at least 2 days in advance of your appointment: [phone]  For information about your ride after you set it up, call Ride Assist at [phone]. Use this number to activate a Will Call pickup, or if your transportation is late for a scheduled pickup. Use this number, too, if you need to make a change or cancel a previously scheduled reservation.  If you need transportation services right away, call [phone]. The after-hours call center is staffed 24 hours to handle ride assistance and urgent reservation requests (including discharges) 365 days a year. Urgent trips include sick visits, hospital discharge requests and life-sustaining treatment.  Call the [facility] Line at [phone], at any time, 24 hours a day, 7 days a week. If you are in danger or need immediate medical attention call 911.  If you would like help to quit smoking, call 1-800-QUIT-NOW ((305)566-4485) OR Espaol: 1-855-Djelo-Ya (2-707-867-5449) o para ms informacin haga clic aqu or Text READY to 201-007 to register via text  Mr. Blondin - following are the goals we discussed in your visit today:    Goals Addressed   None     Social Worker will follow up in 30 days.   [name], [name], MHA [facility]  Managed Medicaid Social Worker [phone]   Following is a copy of your plan of care:  There are no care plans that you recently modified to display for this patient.

## 2022-12-27 NOTE — Patient Outreach (Signed)
Medicaid Managed Care Social Work Note  12/27/2022 Name:  DEKKER VERGA MRN:  [id number] DOB:  May 29, 1961  Chris [name] is an 62 y.o. year old male who is a primary patient of [name], MD.  The Medicaid Managed Care Coordination team was consulted for assistance with:   housing repairs  Mr. Brickle was given information about Medicaid Managed Care Coordination team services today. [name] Patient agreed to services and verbal consent obtained.  Engaged with patient  for by telephone forfollow up visit in response to referral for case management and/or care coordination services.   Assessments/Interventions:  Review of past medical history, allergies, medications, health status, including review of consultants reports, laboratory and other test data, was performed as part of comprehensive evaluation and provision of chronic care management services.  SDOH: (Social Determinant of Health) assessments and interventions performed: SDOH Interventions    Flowsheet Row Telephone from 12/23/2022 in [place] POPULATION HEALTH DEPARTMENT Patient Outreach Telephone from 12/17/2022 in [place] POPULATION HEALTH DEPARTMENT Patient Outreach Telephone from 11/10/2022 in [place] POPULATION HEALTH DEPARTMENT Patient Outreach Telephone from 10/25/2022 in [place] POPULATION HEALTH DEPARTMENT Patient Outreach Telephone from 10/20/2022 in [place] POPULATION HEALTH DEPARTMENT Patient Outreach Telephone from 08/27/2022 in [place] POPULATION HEALTH DEPARTMENT  SDOH Interventions        Transportation Interventions Payor Benefit -- -- -- Payor Benefit --  Stress Interventions -- [employer], Provide Counseling Offered [employer], Provide Counseling  [I am feeling better since I seen my doctor] Provide Counseling, Offered [employer] Resources -- [employer], Provide Counseling     [name] completed a telephone  outreach with patient, he states everything is going well. He would like resources for home repairs. [name] will mail resources to patient. No other resources are needed at this time.  Advanced Directives Status:  Not addressed in this encounter.  Care Plan                 No Known Allergies  Medications Reviewed Today     Reviewed by Chrystine Oiler, CMA (Certified Medical Assistant) on 12/22/22 at 1157  Med List Status: <None>   Medication Order Taking? Sig Documenting Provider Last Dose Status Informant  Accu-Chek Softclix Lancets lancets [id number]  Use as instructed  Patient not taking: Reported on 12/07/2022   [name], MD  Active Self  acetaminophen (TYLENOL) 500 MG tablet [id number] Yes Take 1-2 tablets (500-1,000 mg total) by mouth every 6 (six) hours as needed (pain.). Gerhard Munch, MD Taking Active   albuterol (PROVENTIL) (2.5 MG/3ML) 0.083% nebulizer solution [id number]  Take 3 mLs (2.5 mg total) by nebulization every 4 (four) hours as needed for wheezing or shortness of breath. [name], MD  Expired 11/19/22 2359 Self  albuterol (VENTOLIN HFA) 108 (90 Base) MCG/ACT inhaler [id number] Yes Inhale 2 puffs into the lungs every 6 (six) hours as needed for wheezing or shortness of breath. [name], MD Taking Active   allopurinol (ZYLOPRIM) 100 MG tablet [id number] Yes TAKE 2 TABLETS (200 MG TOTAL) BY MOUTH DAILY. Gerhard Munch, MD Taking Active   apixaban (ELIQUIS) 5 MG TABS tablet [id number] Yes Take 1 tablet (5 mg total) by mouth 2 (two) times daily. Laurey Morale, MD Taking Active   atorvastatin (LIPITOR) 40 MG tablet [id number] Yes Take 1 tablet (40 mg total) by mouth daily. Laurey Morale, MD Taking Active   baclofen (LIORESAL) 10 MG tablet  [id number] Yes Take 1 tablet (10 mg total) by mouth 3 (three) times daily. Arthor Captain, PA-C Taking Active   Blood Glucose Monitoring Suppl (ACCU-CHEK GUIDE) w/Device Andria Rhein [id number]  Use as directed  Patient  not taking: Reported on 12/07/2022   [name], MD  Active Self  budesonide-formoterol [facility]) 160-4.5 MCG/ACT inhaler [id number] Yes Inhale 2 puffs into the lungs 2 (two) times daily. [name], MD Taking Active   carvedilol (COREG) 12.5 MG tablet [id number] Yes Take 1 tablet (12.5 mg total) by mouth 2 (two) times daily. Laurey Morale, MD Taking Active   dapagliflozin propanediol (FARXIGA) 10 MG TABS tablet [id number] Yes Take 1 tablet (10 mg total) by mouth daily. [name], MD Taking Active   glucose blood (ACCU-CHEK GUIDE) test strip [id number]  Use as directed to check blood sugar 1-2 times a day  Patient not taking: Reported on 12/07/2022   [name], MD  Active Self  HYDROcodone-acetaminophen (NORCO/VICODIN) 5-325 MG tablet [id number] No Take 1 tablet by mouth every 6 (six) hours as needed for severe pain.  Patient not taking: Reported on 12/22/2022   Gerhard Munch, MD Not Taking Active   hydrOXYzine (ATARAX) 25 MG tablet [id number] Yes Take 1 tablet (25 mg total) by mouth 3 (three) times daily as needed. Park Pope, MD Taking Active   isosorbide-hydrALAZINE (BIDIL) 20-37.5 MG tablet [id number] Yes Take 1 tablet by mouth 3 (three) times daily. [name], MD Taking Active Self  methylPREDNISolone (MEDROL DOSEPAK) 4 MG TBPK tablet [id number] No Use as directed  Patient not taking: Reported on 12/22/2022   Arthor Captain, PA-C Not Taking Active     Discontinued 07/11/20 0943 (Discontinued by provider)   mirtazapine (REMERON) 15 MG tablet [id number] Yes Take 1 tablet (15 mg total) by mouth at bedtime. Park Pope, MD Taking Active   potassium chloride (KLOR-CON M) 10 MEQ tablet [id number] Yes Take 2 tablets (20 mEq total) by mouth daily. Laurey Morale, MD Taking Active   Tiotropium Bromide Monohydrate (SPIRIVA RESPIMAT) 2.5 MCG/ACT AERS [id number]  Inhale 2 puffs into the lungs daily. [name], MD  Active   torsemide 40 MG TABS [id number] Yes  Take 20 mg daily, alternating with 40 mg daily. Gerhard Munch, MD Taking Active   traMADol Janean Sark) 50 MG tablet [id number] Yes Take 1 tablet (50 mg total) by mouth every 6 (six) hours as needed. Arthor Captain, PA-C Taking Active             Patient Active Problem List   Diagnosis Date Noted   Post traumatic stress disorder 09/23/2022   MDD (major depressive disorder), recurrent episode, moderate (HCC) 08/26/2022   Stage 3b chronic kidney disease (HCC) 08/09/2022   Hypertrophic cardiomyopathy (HCC) 08/09/2022   History of renal cell cancer 08/09/2022   Chronic heart failure with preserved ejection fraction (HFpEF) (HCC) 07/13/2022   Agitation 04/23/2022   RSV (respiratory syncytial virus pneumonia) 04/22/2022   Obesity (BMI 30-39.9) 04/21/2022   COPD exacerbation (HCC) 04/19/2022   Dyslipidemia 04/19/2022   Gout 04/19/2022   Right kidney mass 05/14/2021   CHF exacerbation (HCC) 04/14/2021   Chest pain 04/14/2021   Syncope 04/14/2021   Left-sided weakness 10/28/2020   Typical atrial flutter (HCC)    CHF (congestive heart failure) (HCC) 07/04/2020   Acute exacerbation of CHF (congestive heart failure) (HCC) 06/16/2020   Influenza vaccine refused 05/06/2020   Acute decompensated heart failure (HCC) 05/04/2020   Illiteracy 05/04/2020   Type 2 diabetes mellitus  with stage 3 chronic kidney disease (HCC) 12/25/2019   Elevated troponin I level 10/26/2019   History of gout 02/01/2019   Seasonal allergic rhinitis due to pollen 02/01/2019   Nicotine use disorder 11/30/2018   Microscopic hematuria 11/30/2018   Depression 11/30/2018   Difficulty controlling anger 11/30/2018   COPD (chronic obstructive pulmonary disease) (HCC)    CKD (chronic kidney disease) stage 3, GFR 30-59 ml/min (HCC) 08/10/2018   Recurrent epistaxis 04/21/2018   Mixed hyperlipidemia 07/28/2017   Essential hypertension 07/28/2017   Chronic systolic heart failure (HCC) 10/25/2014   Cocaine abuse (HCC)  02/20/2013   Cannabis abuse 02/20/2013   Back pain, chronic 02/20/2013    Conditions to be addressed/monitored per PCP order:   home repairs  There are no care plans that you recently modified to display for this patient.   Follow up:  Patient agrees to Care Plan and Follow-up.  Plan: The Managed Medicaid care management team will reach out to the patient again over the next 30 days.  Date/time of next scheduled Social Work care management/care coordination outreach:  01/27/23  [name], [name], [facility]  Managed [facility] Social Worker [phone]

## 2023-01-04 NOTE — Progress Notes (Unsigned)
Patient ID: Chris [name], male   DOB: 1961-06-07, 62 y.o.   MRN: [id number]  Seen at ED 7/8 for CP tested +cocaine, elevated BNP.  Seen by cardiology in f/up 12/22/2023   From ED note:  Chris [name] is a 62 y.o. male who presents emergency department with a chief complaint of chest pain back pain and foot pain.  He complains of back pain and pain radiating down the right side of his leg.  He was seen for the same complaint on 12/09/2022 and reports that his pain has not improved.  He was diagnosed with sciatica at that time.  Also complaining of recurrent pain in his right first MTP consistent with history of gout which is causing him to limp on the right leg which is already very painful for him.  Patient also reports shortness of breath and chest pressure.  He has a history of previous cocaine abuse, CHF, known CAD.  Patient underwent a heart catheterization within the last month that showed 65% stenosis of the RCA.  He denies nausea vomiting or diaphoresis.   Ordered, and personally interpreted labs. The pertinent results include: Troponins are elevated but flat. CBC within normal limits, UDS positive for cocaine. Mild elevation in patient's creatinine likely due to dehydration and recent cocaine abuse. Fluids repleted here in the emergency department BNP elevated but stable from previous.    MDM: 62 year old male with cocaine abuse seeking treatment for pain.  I addressed his cocaine positive urine.  He states that his "pain was so bad I had to do 4 lines a Coke yesterday." He also states he is currently trying to get into a pain clinic.  I advised the patient that if he wanted to be in a pain clinic he would get kicked out and if he had a positive UDS. Patient's chest pain is improving and he had a reassuring cardiac catheterization recently which showed 60% stenosis of the RCA without significant arterial disease otherwise.  He may have a little bit of vasospastic chest pain however his  troponins are flat throughout the visit.  I reviewed PDMP.  Patient given a small amount of Ultram for his pain however he was upset with this asking for oxycodone.  I discussed that I felt I was being generous with his pain control considering his positive cocaine urine drug screen.  Patient will be discharged with Medrol, short course of Ultram.  He may take Tylenol.  He has been given antispasmodics and he may follow-up with his primary care physician.  From cardiology A/P from 7/10: 1. Chronic diastolic CHF/?hypertrophic cardiomyopathy: Prior history of HFrEF that may have been tachycardia-mediated in setting of atrial flutter in 1/22 vs cocaine-related, now EF has recovered to 50% on last study. Cardiolite in 2/23 showed no ischemia, fixed inferior defect.  Echo in 8/23 showed EF 50% with severe asymmetric septal hypertrophy but no SAM or LVOT gradient.  Cardiac MRI in 12/23 was similar with LV EF 50%, severe asymmetric septal hypertrophy, normal RV, LGE in the basal septum. These studies were concerning for hypertrophic cardiomyopathy.  RHC in 1/24 showed elevated filling pressures with CI 2.25.  His grandfather may have had sudden cardiac death at work, father died at 73 from "heart disease."  He has had syncopal episodes that may have been orthostatic, but I worried about ventricular arrhythmias given possible HCM and LGE noted on cardiac MRI. Zio monitor in 2/24 showed 1.5% PVCs and 6 short NSVT runs (longest 7 beats).  Nothing to explain syncope and has had no further episodes. 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  PKP2 gene is associated with ARVC and Brugada, but the clinical significance of the mutation found in this patient is uncertain (not reported in literature).  Echo in 5/24  showed EF 45-50%, moderate asymmetric septal hypertrophy with no LVOT gradient of mitral valve SAM, RV normal, mild-moderate MR, IVC normal. Cath in 6/24 showed nonobstructive CAD, normal filling pressures, low CI.  Stable NYHA class II-III, not volume overloaded on exam. 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  - Has appointment with Sidney Ace for genetic counseling regarding the mutations of uncertain significance.  - With no LVOT obstruction or significant murmur, ok to continue Bidil.  - Continue Coreg 12.5 mg bid.  - I will continue torsemide 40 mg daily alternating with 20 mg daily, BMET/BNP today..  - Continue dapagliflozin 10 mg daily.  - We need to consider an ICD with suspected HCM and some higher risk markers, though somewhat borderline. I would like him to see Sidney Ace first.  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  He has had short NSVT runs on 1 week monitor (longest 7 beats).  No LVOT obstruction.  EF is mildly decreased at 45-50%.  He has had syncope but not sure it was arrhythmic (had 1 syncopal episode while wearing monitor and no corresponding arrhythmic event).  Grandfather likely had SCD.  He has appointment with EP for ICD evaluation.  2. CKD stage 3: BMET today.   3. COPD: Patient is active smoker and carries this history.  I strongly encouraged him to quit smoking today.  He is smoking much less than before.   4. Atrial flutter: S/p DCCV in 1/22.  NSR today.  - Continue apixaban.  5. Cocaine abuse: Patient had a relapse earlier this month in setting of stress from court case and back pain. He says that he used once and will not use again.  6. 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  - Check lipids today, continue statin.

## 2023-01-05 ENCOUNTER — Ambulatory Visit (INDEPENDENT_AMBULATORY_CARE_PROVIDER_SITE_OTHER): Payer: Medicaid Other | Admitting: Physician Assistant

## 2023-01-05 ENCOUNTER — Encounter (INDEPENDENT_AMBULATORY_CARE_PROVIDER_SITE_OTHER): Payer: Self-pay

## 2023-01-05 VITALS — BP 124/77 | HR 69 | Resp 16 | Wt 250.0 lb

## 2023-01-05 DIAGNOSIS — E782 Mixed hyperlipidemia: Secondary | ICD-10-CM

## 2023-01-05 DIAGNOSIS — G8929 Other chronic pain: Secondary | ICD-10-CM | POA: Diagnosis not present

## 2023-01-05 DIAGNOSIS — I10 Essential (primary) hypertension: Secondary | ICD-10-CM | POA: Diagnosis not present

## 2023-01-05 DIAGNOSIS — Z09 Encounter for follow-up examination after completed treatment for conditions other than malignant neoplasm: Secondary | ICD-10-CM | POA: Diagnosis not present

## 2023-01-05 DIAGNOSIS — M109 Gout, unspecified: Secondary | ICD-10-CM

## 2023-01-05 DIAGNOSIS — M5440 Lumbago with sciatica, unspecified side: Secondary | ICD-10-CM

## 2023-01-05 DIAGNOSIS — J42 Unspecified chronic bronchitis: Secondary | ICD-10-CM | POA: Diagnosis not present

## 2023-01-05 DIAGNOSIS — I5022 Chronic systolic (congestive) heart failure: Secondary | ICD-10-CM

## 2023-01-05 DIAGNOSIS — R7303 Prediabetes: Secondary | ICD-10-CM | POA: Diagnosis not present

## 2023-01-05 DIAGNOSIS — N183 Chronic kidney disease, stage 3 unspecified: Secondary | ICD-10-CM | POA: Diagnosis not present

## 2023-01-05 DIAGNOSIS — F1410 Cocaine abuse, uncomplicated: Secondary | ICD-10-CM

## 2023-01-05 MED ORDER — ALBUTEROL SULFATE (2.5 MG/3ML) 0.083% IN NEBU
2.5000 mg | INHALATION_SOLUTION | RESPIRATORY_TRACT | 2 refills | Status: DC | PRN
Start: 2023-01-05 — End: 2024-02-14

## 2023-01-05 MED ORDER — ALLOPURINOL 100 MG PO TABS
ORAL_TABLET | Freq: Every day | ORAL | 2 refills | Status: DC
Start: 2023-01-05 — End: 2023-07-26

## 2023-01-05 NOTE — Patient Instructions (Addendum)
Work at a goal of eliminating sugary drinks, candy, desserts, sweets, refined sugars, processed foods, and white carbohydrates.    Make sure you are drinking plenty of water.   [employer].org is the website for narcotics anonymous TonerProviders.com.cy (website) or (281)114-0604 is the information for alcoholics anonymous Both are free and immediately available for help with alcohol and drug use   Gout  Gout is a condition that causes painful swelling of the joints. Gout is a type of inflammation of the joints (arthritis). This condition is caused by having too much uric acid in the body. Uric acid is a chemical that forms when the body breaks down substances called purines. Purines are important for building body proteins. When the body has too much uric acid, sharp crystals can form and build up inside the joints. This causes pain and swelling. Gout attacks can happen quickly and may be very painful (acute gout). Over time, the attacks can affect more joints and become more frequent (chronic gout). Gout can also cause uric acid to build up under the skin and inside the kidneys. What are the causes? This condition is caused by too much uric acid in your blood. This can happen because: Your kidneys do not remove enough uric acid from your blood. This is the most common cause. Your body makes too much uric acid. This can happen with some cancers and cancer treatments. It can also occur if your body is breaking down too many red blood cells (hemolytic anemia). You eat too many foods that are high in purines. These foods include organ meats and some seafood. Alcohol, especially beer, is also high in purines. A gout attack may be triggered by trauma or stress. What increases the risk? The following factors may make you more likely to develop this condition: Having a family history of gout. Being male and middle-aged. Being male and having gone through menopause. Taking certain medicines, including aspirin,  cyclosporine, diuretics, levodopa, and niacin. Having an organ transplant. Having certain conditions, such as: Being obese. Lead poisoning. Kidney disease. A skin condition called psoriasis. Other factors include: Losing weight too quickly. Being dehydrated. Frequently drinking alcohol, especially beer. Frequently drinking beverages that are sweetened with a type of sugar called fructose. What are the signs or symptoms? An attack of acute gout happens quickly. It usually occurs in just one joint. The most common place is the big toe. Attacks often start at night. Other joints that may be affected include joints of the feet, ankle, knee, fingers, wrist, or elbow. Symptoms of this condition may include: Severe pain. Warmth. Swelling. Stiffness. Tenderness. The affected joint may be very painful to touch. Shiny, red, or purple skin. Chills and fever. Chronic gout may cause symptoms more frequently. More joints may be involved. You may also have white or yellow lumps (tophi) on your hands or feet or in other areas near your joints. How is this diagnosed? This condition is diagnosed based on your symptoms, your medical history, and a physical exam. You may have tests, such as: Blood tests to measure uric acid levels. Removal of joint fluid with a thin needle (aspiration) to look for uric acid crystals. X-rays to look for joint damage. How is this treated? Treatment for this condition has two phases: treating an acute attack and preventing future attacks. Acute gout treatment may include medicines to reduce pain and swelling, including: NSAIDs, such as ibuprofen. Steroids. These are strong anti-inflammatory medicines that can be taken by mouth (orally) or injected into a  joint. Colchicine. This medicine relieves pain and swelling when it is taken soon after an attack. It can be given by mouth or through an IV. Preventive treatment may include: Daily use of smaller doses of NSAIDs or  colchicine. Use of a medicine that reduces uric acid levels in your blood, such as allopurinol. Changes to your diet. You may need to see a dietitian about what to eat and drink to prevent gout. Follow these instructions at home: During a gout attack  If directed, put ice on the affected area. To do this: Put ice in a plastic bag. Place a towel between your skin and the bag. Leave the ice on for 20 minutes, 2-3 times a day. Remove the ice if your skin turns bright red. This is very important. If you cannot feel pain, heat, or cold, you have a greater risk of damage to the area. Raise (elevate) the affected joint above the level of your heart as often as possible. Rest the joint as much as possible. If the affected joint is in your leg, you may be given crutches to use. Follow instructions from your health care provider about eating or drinking restrictions. Avoiding future gout attacks Follow a low-purine diet as told by your dietitian or health care provider. Avoid foods and drinks that are high in purines, including liver, kidney, anchovies, asparagus, herring, mushrooms, mussels, and beer. Maintain a healthy weight or lose weight if you are overweight. If you want to lose weight, talk with your health care provider. Do not lose weight too quickly. Start or maintain an exercise program as told by your health care provider. Eating and drinking Avoid drinking beverages that contain fructose. Drink enough fluids to keep your urine pale yellow. If you drink alcohol: Limit how much you have to: 0-1 drink a day for women who are not pregnant. 0-2 drinks a day for men. Know how much alcohol is in a drink. In the [place] one drink equals one 12 oz bottle of beer (355 mL), one 5 oz glass of wine (148 mL), or one 1 oz glass of hard liquor (44 mL). General instructions Take over-the-counter and prescription medicines only as told by your health care provider. Ask your health care provider if the  medicine prescribed to you requires you to avoid driving or using machinery. Return to your normal activities as told by your health care provider. Ask your health care provider what activities are safe for you. Keep all follow-up visits. This is important. Where to find more information [employer]: www.niams.http://www.myers.net/ Contact a health care provider if you have: Another gout attack. Continuing symptoms of a gout attack after 10 days of treatment. Side effects from your medicines. Chills or a fever. Burning pain when you urinate. Pain in your lower back or abdomen. Get help right away if you: Have severe or uncontrolled pain. Cannot urinate. Summary Gout is painful swelling of the joints caused by having too much uric acid in the body. The most common site for gout to occur is in the big toe, but it can affect other joints in the body. Medicines and dietary changes can help to prevent and treat gout attacks. This information is not intended to replace advice given to you by your health care provider. Make sure you discuss any questions you have with your health care provider. Document Revised: 03/04/2021 Document Reviewed: 03/04/2021 Elsevier Patient Education  2024 [employer].

## 2023-01-06 ENCOUNTER — Other Ambulatory Visit: Payer: Self-pay | Admitting: Physician Assistant

## 2023-01-06 DIAGNOSIS — N1832 Chronic kidney disease, stage 3b: Secondary | ICD-10-CM

## 2023-01-06 LAB — COMPREHENSIVE METABOLIC PANEL
ALT: 15 IU/L (ref 0–44)
AST: 23 IU/L (ref 0–40)
Albumin: 4.1 g/dL (ref 3.8–4.9)
Alkaline Phosphatase: 91 IU/L (ref 44–121)
BUN: 61 mg/dL — ABNORMAL HIGH (ref 8–27)
Bilirubin Total: 0.2 mg/dL (ref 0.0–1.2)
CO2: 22 mmol/L (ref 20–29)
Calcium: 9.4 mg/dL (ref 8.6–10.2)
Chloride: 100 mmol/L (ref 96–106)
Creatinine, Ser: 2.73 mg/dL — ABNORMAL HIGH (ref 0.76–1.27)
Glucose: 69 mg/dL — ABNORMAL LOW (ref 70–99)
Potassium: 4.7 mmol/L (ref 3.5–5.2)
Total Protein: 7 g/dL (ref 6.0–8.5)
eGFR: 26 mL/min/{1.73_m2} — ABNORMAL LOW (ref >59)

## 2023-01-06 LAB — HEMOGLOBIN A1C
Est. average glucose Bld gHb Est-mCnc: 128 mg/dL
Hgb A1c MFr Bld: 6.1 % — ABNORMAL HIGH (ref 4.8–5.6)

## 2023-01-07 ENCOUNTER — Encounter: Payer: Self-pay | Admitting: *Deleted

## 2023-01-07 ENCOUNTER — Other Ambulatory Visit: Payer: Medicaid Other | Admitting: *Deleted

## 2023-01-07 DIAGNOSIS — E559 Vitamin D deficiency, unspecified: Secondary | ICD-10-CM | POA: Diagnosis not present

## 2023-01-07 DIAGNOSIS — R5383 Other fatigue: Secondary | ICD-10-CM | POA: Diagnosis not present

## 2023-01-07 DIAGNOSIS — M129 Arthropathy, unspecified: Secondary | ICD-10-CM | POA: Diagnosis not present

## 2023-01-07 DIAGNOSIS — Z6836 Body mass index (BMI) 36.0-36.9, adult: Secondary | ICD-10-CM | POA: Diagnosis not present

## 2023-01-07 DIAGNOSIS — Z79899 Other long term (current) drug therapy: Secondary | ICD-10-CM | POA: Diagnosis not present

## 2023-01-07 DIAGNOSIS — M5440 Lumbago with sciatica, unspecified side: Secondary | ICD-10-CM | POA: Diagnosis not present

## 2023-01-07 DIAGNOSIS — R030 Elevated blood-pressure reading, without diagnosis of hypertension: Secondary | ICD-10-CM | POA: Diagnosis not present

## 2023-01-07 DIAGNOSIS — E6609 Other obesity due to excess calories: Secondary | ICD-10-CM | POA: Diagnosis not present

## 2023-01-07 NOTE — Patient Instructions (Signed)
Visit Information  [name] was given information about Medicaid Managed Care team care coordination services as a part of their Healthy [facility] Medicaid benefit. Chris [name] verbally consented to engagement with the [facility] Managed Care team.   If you are experiencing a medical emergency, please call 911 or report to your local emergency department or urgent care.   If you have a non-emergency medical problem during routine business hours, please contact your provider's office and ask to speak with a nurse.   For questions related to your Healthy [facility] health plan, please call: [phone] or visit the homepage here: [email or web address]  If you would like to schedule transportation through your Healthy [employer] plan, please call the following number at least 2 days in advance of your appointment: [phone]  For information about your ride after you set it up, call Ride Assist at [phone]. Use this number to activate a Will Call pickup, or if your transportation is late for a scheduled pickup. Use this number, too, if you need to make a change or cancel a previously scheduled reservation.  If you need transportation services right away, call [phone]. The after-hours call center is staffed 24 hours to handle ride assistance and urgent reservation requests (including discharges) 365 days a year. Urgent trips include sick visits, hospital discharge requests and life-sustaining treatment.  Call the [place] Line at [phone], at any time, 24 hours a day, 7 days a week. If you are in danger or need immediate medical attention call 911.  If you would like help to quit smoking, call 1-800-QUIT-NOW (613-051-3464) OR Espaol: 1-855-Djelo-Ya (6-010-932-3557) o para ms informacin haga clic aqu or Text READY to 322-025 to register via text  Chris [name],   Please see education materials related to HF and smoking  cessation provided by MyChart link.  Patient verbalizes understanding of instructions and care plan provided today and agrees to view in MyChart. Active MyChart status and patient understanding of how to access instructions and care plan via MyChart confirmed with patient.     Telephone follow up appointment with Managed Medicaid care management team member scheduled for:03/09/23 @ 9am  Estanislado Emms RN, BSN [place]  Managed [facility] RN Care Coordinator [phone]   Following is a copy of your plan of care:  Care Plan : RN Care Manager Plan of Care  Updates made by [name], RN since 01/07/2023 12:00 AM     Problem: Health Management Needs Related to Heart Failure and Chronic Pain   Priority: High  Note:   Current Barriers:    RNCM Clinical Goal(s):  Patient will take all medications exactly as prescribed and will call provider for medication related questions as evidenced by medication review and patient interview demonstrate Ongoing adherence to prescribed treatment plan for CHF as evidenced by medication review continue to work with RN Care Manager to address care management and care coordination needs related to  CHF as evidenced by adherence to CM Team Scheduled appointments work with social worker to address  related to the management of Limited social support, Transportation, and Lack of essential utilities - light/power* related to the management of CHF as evidenced by review of EMR and patient or Child psychotherapist report through collaboration with Medical illustrator, provider, and care team.   Interventions: Collaboration with LCSW/[name]/Care Guide and Heart Failure RN Inter-disciplinary care team collaboration (see longitudinal plan of care) Evaluation of current treatment plan related to  self management and patient's adherence to plan  as established by provider   Heart Failure Interventions:  (Status:  New goal.) Long Term Goal Discussed importance of daily weight  and advised patient to weigh and record daily Reviewed role of diuretics in prevention of fluid overload and management of heart failure; Discussed the importance of keeping all appointments with provider Assessed social determinant of health barriers  Discussed importance of self assessment reporting of swelling, increased shortness of breath, chest pain, dizziness, or other new or worsened symptom  Pain Interventions:  (Status:  New goal.) Short Term Goal Pain assessment performed Medications reviewed Reviewed provider established plan for pain management Discussed importance of adherence to all scheduled medical appointments Counseled on the importance of reporting any/all new or changed pain symptoms or management strategies to pain management provider Reviewed with patient prescribed pharmacological and nonpharmacological pain relief strategies Assessed social determinant of health barriers Advised patient that the CM team would discuss pain management needs with provider and assist with connection to pain management provider  Patient Goals/Self-Care Activities: Take all medications as prescribed Attend all scheduled provider appointments Call provider office for new concerns or questions  Work with the social worker to address care coordination needs and will continue to work with the clinical team to address health care and disease management related needs call 1-800-273-TALK (toll free, 24 hour hotline) if experiencing a Mental Health or Behavioral Health Crisis   Follow Up Plan:  Telephone follow up appointment with care management team member scheduled for:  07/27/22 10am      Long-Range Goal: Independent self health management of Heart Failure and Chronic Pain   Start Date: 07/23/2022  Expected End Date: 02/11/2023  Recent Progress: On track  Priority: High  Note:   Current Barriers:  Knowledge Deficits related to plan of care for management of CHF and Chronic Pain Care  Coordination needs related to Transportation  Non-adherence to prescribed medication regimen Difficulty obtaining medications  RNCM Clinical Goal(s):  Patient will take all medications exactly as prescribed and will call provider for medication related questions as evidenced by medication review and patient interview demonstrate Ongoing adherence to prescribed treatment plan for CHF as evidenced by medication review continue to work with RN Care Manager to address care management and care coordination needs related to  CHF as evidenced by adherence to CM Team Scheduled appointments work with social worker to address  related to the management of Limited social support, Transportation, and Lack of essential utilities - light/power* related to the management of CHF as evidenced by review of EMR and patient or Child psychotherapist report through collaboration with Medical illustrator, provider, and care team.   Interventions: Inter-disciplinary care team collaboration (see longitudinal plan of care) Evaluation of current treatment plan related to  self management and patient's adherence to plan as established by provider Advised patient to contact [facility] [phone] for needed refills and request delivery if needed Collaborated with Dr. [name] regarding Bidil prescription Reviewed upcoming appointments including: 01/17/23 with LCSW, 01/25/23 with Genetic Counseling, 8/19 and 8/27 at [facility]  and 02/25/23 with Dr. Shirlee Latch Verified that transportation to upcoming appointment have been arranged Advised patient to take all medications with him to upcoming provider appointments   COPD Interventions:  (Status:  Goal on track:  Yes.) Long Term Goal Provided patient with basic written and verbal COPD education on self care/management/and exacerbation prevention Provided education about and advised patient to utilize infection prevention strategies to reduce risk of respiratory infection Discussed the  importance of adequate  rest and management of fatigue with COPD Assessed social determinant of health barriers Advised patient on smoking cessation, provided education    Heart Failure Interventions:  (Status: goal progressing) Long Term Goal Discussed importance of daily weight and advised patient to weigh and record daily Reviewed role of diuretics in prevention of fluid overload and management of heart failure; Discussed the importance of keeping all appointments with provider  Discussed importance of self assessment reporting of swelling, increased shortness of breath, chest pain, dizziness, or other new or worsened symptom Reviewed upcoming appointments and ensured transportation arrangements have been made  Pain Interventions:  (Status:  Goal on track) Long Term Goal Pain assessment performed Medications reviewed: advised patient to take all medications to new visit with [facility] Reviewed provider established plan for pain management Discussed importance of adherence to all scheduled medical appointments Counseled on the importance of reporting any/all new or changed pain symptoms or management strategies to pain management provider Reviewed with patient prescribed pharmacological and nonpharmacological pain relief strategies Assessed social determinant of health barriers  Patient Goals/Self-Care Activities: Take all medications as prescribed Attend all scheduled provider appointments Call provider office for new concerns or questions  Work with the social worker to address care coordination needs and will continue to work with the clinical team to address health care and disease management related needs call 1-800-273-TALK (toll free, 24 hour hotline) if experiencing a Mental Health or Behavioral Health Crisis   Follow Up Plan:  Telephone follow up appointment with care management team member scheduled for:  03/09/23 @ 9am

## 2023-01-07 NOTE — Patient Outreach (Signed)
Medicaid Managed Care   Nurse Care Manager Note  01/07/2023 Name:  Chris [name] MRN:  [id number] DOB:  12/22/1960  Chris [name] is an 62 y.o. year old male who is a primary patient of Chris Matar, MD.  The [facility] Managed Care Coordination team was consulted for assistance with:    CHF pain  Chris [name] was given information about Medicaid Managed Care Coordination team services today. Chris [name] Patient agreed to services and verbal consent obtained.  Engaged with patient by telephone for follow up visit in response to provider referral for case management and/or care coordination services.   Assessments/Interventions:  Review of past medical history, allergies, medications, health status, including review of consultants reports, laboratory and other test data, was performed as part of comprehensive evaluation and provision of chronic care management services.  SDOH (Social Determinants of Health) assessments and interventions performed: SDOH Interventions    Flowsheet Row Telephone from 12/23/2022 in [place] POPULATION HEALTH DEPARTMENT Patient Outreach Telephone from 12/17/2022 in [place] POPULATION HEALTH DEPARTMENT Patient Outreach Telephone from 11/10/2022 in [place] POPULATION HEALTH DEPARTMENT Patient Outreach Telephone from 10/25/2022 in [place] POPULATION HEALTH DEPARTMENT Patient Outreach Telephone from 10/20/2022 in [place] POPULATION HEALTH DEPARTMENT Patient Outreach Telephone from 08/27/2022 in [place] POPULATION HEALTH DEPARTMENT  SDOH Interventions        Transportation Interventions Payor Benefit -- -- -- Payor Benefit --  Stress Interventions -- [employer], Provide Counseling Offered [employer], Provide Counseling  [I am feeling better since I seen my doctor] Provide Counseling, Offered [employer] Resources -- [employer], Provide Counseling       Care  Plan  No Known Allergies  Medications Reviewed Today     Reviewed by Chris Dach, RN (Registered Nurse) on 01/07/23 at [phone]  Med List Status: <None>   Medication Order Taking? Sig Documenting Provider Last Dose Status Informant  Accu-Chek Softclix Lancets lancets [id number] Yes Use as instructed Chris Matar, MD Taking Active Self  acetaminophen (TYLENOL) 500 MG tablet [id number] Yes Take 1-2 tablets (500-1,000 mg total) by mouth every 6 (six) hours as needed (pain.). Chris Munch, MD Taking Active   albuterol (PROVENTIL) (2.5 MG/3ML) 0.083% nebulizer solution [id number] Yes Take 3 mLs (2.5 mg total) by nebulization every 4 (four) hours as needed for wheezing or shortness of breath. Chris Co M, PA-C Taking Active   albuterol (VENTOLIN HFA) 108 (90 Base) MCG/ACT inhaler [id number] Yes Inhale 2 puffs into the lungs every 6 (six) hours as needed for wheezing or shortness of breath. Chris Matar, MD Taking Active   allopurinol (ZYLOPRIM) 100 MG tablet [id number] Yes TAKE 2 TABLETS (200 MG TOTAL) BY MOUTH DAILY. Chris Simmonds, PA-C Taking Active   apixaban (ELIQUIS) 5 MG TABS tablet [id number] Yes Take 1 tablet (5 mg total) by mouth 2 (two) times daily. Chris Morale, MD Taking Active   atorvastatin (LIPITOR) 40 MG tablet [id number] Yes Take 1 tablet (40 mg total) by mouth daily. Chris Morale, MD Taking Active   atorvastatin (LIPITOR) 80 MG tablet [id number] Yes Take 1 tablet (80 mg total) by mouth daily. Chris Morale, MD Taking Active   baclofen (LIORESAL) 10 MG tablet [id number] Yes Take 1 tablet (10 mg total) by mouth 3 (three) times daily. Chris Captain, PA-C Taking Active   budesonide-formoterol [facility]) 160-4.5 MCG/ACT inhaler [id number] Yes Inhale 2 puffs into the lungs 2 (two) times daily. Chris Matar,  MD Taking Active   carvedilol (COREG) 12.5 MG tablet [id number] Yes Take 1 tablet (12.5 mg total) by mouth 2 (two) times daily. Chris Morale, MD  Taking Active   dapagliflozin propanediol (FARXIGA) 10 MG TABS tablet [id number] Yes Take 1 tablet (10 mg total) by mouth daily. Chris Matar, MD Taking Active   hydrOXYzine (ATARAX) 25 MG tablet [id number] Yes Take 1 tablet (25 mg total) by mouth 3 (three) times daily as needed. Chris Pope, MD Taking Active   isosorbide-hydrALAZINE (BIDIL) 20-37.5 MG tablet [id number] Yes Take 1 tablet by mouth 3 (three) times daily. Chris Odea, MD Taking Active Self    Discontinued 07/11/20 [phone] (Discontinued by provider)   mirtazapine (REMERON) 15 MG tablet [id number] Yes Take 1 tablet (15 mg total) by mouth at bedtime. Chris Pope, MD Taking Active   potassium chloride (KLOR-CON [name]) 10 MEQ tablet [id number] Yes Take 2 tablets (20 mEq total) by mouth daily. Chris Morale, MD Taking Active   Tiotropium Bromide Monohydrate (SPIRIVA RESPIMAT) 2.5 MCG/ACT AERS [id number] Yes Inhale 2 puffs into the lungs daily. Chris Matar, MD Taking Active   torsemide 40 MG TABS [id number] Yes Take 20 mg daily, alternating with 40 mg daily. Chris Munch, MD Taking Active   traMADol Janean Sark) 50 MG tablet [id number] Yes Take 1 tablet (50 mg total) by mouth every 6 (six) hours as needed. Chris Captain, PA-C Taking Active             Patient Active Problem List   Diagnosis Date Noted   Post traumatic stress disorder 09/23/2022   MDD (major depressive disorder), recurrent episode, moderate (HCC) 08/26/2022   Stage 3b chronic kidney disease (HCC) 08/09/2022   Hypertrophic cardiomyopathy (HCC) 08/09/2022   History of renal cell cancer 08/09/2022   Chronic heart failure with preserved ejection fraction (HFpEF) (HCC) 07/13/2022   Agitation 04/23/2022   RSV (respiratory syncytial virus pneumonia) 04/22/2022   Obesity (BMI 30-39.9) 04/21/2022   COPD exacerbation (HCC) 04/19/2022   Dyslipidemia 04/19/2022   Gout 04/19/2022   Right kidney mass 05/14/2021   CHF exacerbation (HCC) 04/14/2021   Chest pain  04/14/2021   Syncope 04/14/2021   Left-sided weakness 10/28/2020   Typical atrial flutter (HCC)    CHF (congestive heart failure) (HCC) 07/04/2020   Acute exacerbation of CHF (congestive heart failure) (HCC) 06/16/2020   Influenza vaccine refused 05/06/2020   Acute decompensated heart failure (HCC) 05/04/2020   Illiteracy 05/04/2020   Type 2 diabetes mellitus with stage 3 chronic kidney disease (HCC) 12/25/2019   Elevated troponin I level 10/26/2019   History of gout 02/01/2019   Seasonal allergic rhinitis due to pollen 02/01/2019   Nicotine use disorder 11/30/2018   Microscopic hematuria 11/30/2018   Depression 11/30/2018   Difficulty controlling anger 11/30/2018   COPD (chronic obstructive pulmonary disease) (HCC)    CKD (chronic kidney disease) stage 3, GFR 30-59 ml/min (HCC) 08/10/2018   Recurrent epistaxis 04/21/2018   Mixed hyperlipidemia 07/28/2017   Essential hypertension 07/28/2017   Chronic systolic heart failure (HCC) 10/25/2014   Cocaine abuse (HCC) 02/20/2013   Cannabis abuse 02/20/2013   Back pain, chronic 02/20/2013    Conditions to be addressed/monitored per PCP order:  CHF and Pain  Care Plan : RN Care Manager Plan of Care  Updates made by Chris Dach, RN since 01/07/2023 12:00 AM     Problem: Health Management Needs Related to Heart Failure and Chronic Pain   Priority: High  Note:   Current Barriers:  RNCM Clinical Goal(s):  Patient will take all medications exactly as prescribed and will call provider for medication related questions as evidenced by medication review and patient interview demonstrate Ongoing adherence to prescribed treatment plan for CHF as evidenced by medication review continue to work with RN Care Manager to address care management and care coordination needs related to  CHF as evidenced by adherence to CM Team Scheduled appointments work with Child psychotherapist to address  related to the management of Limited social support,  Transportation, and Lack of essential utilities - light/power* related to the management of CHF as evidenced by review of EMR and patient or Child psychotherapist report through collaboration with Medical illustrator, provider, and care team.   Interventions: Collaboration with LCSW/[name]/Care Guide and Heart Failure RN Inter-disciplinary care team collaboration (see longitudinal plan of care) Evaluation of current treatment plan related to  self management and patient's adherence to plan as established by provider   Heart Failure Interventions:  (Status:  New goal.) Long Term Goal Discussed importance of daily weight and advised patient to weigh and record daily Reviewed role of diuretics in prevention of fluid overload and management of heart failure; Discussed the importance of keeping all appointments with provider Assessed social determinant of health barriers  Discussed importance of self assessment reporting of swelling, increased shortness of breath, chest pain, dizziness, or other new or worsened symptom  Pain Interventions:  (Status:  New goal.) Short Term Goal Pain assessment performed Medications reviewed Reviewed provider established plan for pain management Discussed importance of adherence to all scheduled medical appointments Counseled on the importance of reporting any/all new or changed pain symptoms or management strategies to pain management provider Reviewed with patient prescribed pharmacological and nonpharmacological pain relief strategies Assessed social determinant of health barriers Advised patient that the CM team would discuss pain management needs with provider and assist with connection to pain management provider  Patient Goals/Self-Care Activities: Take all medications as prescribed Attend all scheduled provider appointments Call provider office for new concerns or questions  Work with the social worker to address care coordination needs and will continue to work with  the clinical team to address health care and disease management related needs call 1-800-273-TALK (toll free, 24 hour hotline) if experiencing a Mental Health or Behavioral Health Crisis   Follow Up Plan:  Telephone follow up appointment with care management team member scheduled for:  07/27/22 10am      Long-Range Goal: Independent self health management of Heart Failure and Chronic Pain   Start Date: 07/23/2022  Expected End Date: 02/11/2023  Recent Progress: On track  Priority: High  Note:   Current Barriers:  Knowledge Deficits related to plan of care for management of CHF and Chronic Pain Care Coordination needs related to Transportation  Non-adherence to prescribed medication regimen Difficulty obtaining medications  RNCM Clinical Goal(s):  Patient will take all medications exactly as prescribed and will call provider for medication related questions as evidenced by medication review and patient interview demonstrate Ongoing adherence to prescribed treatment plan for CHF as evidenced by medication review continue to work with RN Care Manager to address care management and care coordination needs related to  CHF as evidenced by adherence to CM Team Scheduled appointments work with social worker to address  related to the management of Limited social support, Transportation, and Lack of essential utilities - light/power* related to the management of CHF as evidenced by review of EMR and patient or Child psychotherapist report through collaboration with  RN Care manager, provider, and care team.   Interventions: Inter-disciplinary care team collaboration (see longitudinal plan of care) Evaluation of current treatment plan related to  self management and patient's adherence to plan as established by provider Advised patient to contact [facility] [phone] for needed refills and request delivery if needed Collaborated with Dr. [name] regarding Bidil prescription Reviewed upcoming  appointments including: 01/17/23 with LCSW, 01/25/23 with Genetic Counseling, 8/19 and 8/27 at [facility]  and 02/25/23 with Dr. Shirlee Latch Verified that transportation to upcoming appointment have been arranged Advised patient to take all medications with him to upcoming provider appointments   COPD Interventions:  (Status:  Goal on track:  Yes.) Long Term Goal Provided patient with basic written and verbal COPD education on self care/management/and exacerbation prevention Provided education about and advised patient to utilize infection prevention strategies to reduce risk of respiratory infection Discussed the importance of adequate rest and management of fatigue with COPD Assessed social determinant of health barriers Advised patient on smoking cessation, provided education    Heart Failure Interventions:  (Status: goal progressing) Long Term Goal Discussed importance of daily weight and advised patient to weigh and record daily Reviewed role of diuretics in prevention of fluid overload and management of heart failure; Discussed the importance of keeping all appointments with provider  Discussed importance of self assessment reporting of swelling, increased shortness of breath, chest pain, dizziness, or other new or worsened symptom Reviewed upcoming appointments and ensured transportation arrangements have been made  Pain Interventions:  (Status:  Goal on track) Long Term Goal Pain assessment performed Medications reviewed: advised patient to take all medications to new visit with [facility] Reviewed provider established plan for pain management Discussed importance of adherence to all scheduled medical appointments Counseled on the importance of reporting any/all new or changed pain symptoms or management strategies to pain management provider Reviewed with patient prescribed pharmacological and nonpharmacological pain relief strategies Assessed social determinant  of health barriers  Patient Goals/Self-Care Activities: Take all medications as prescribed Attend all scheduled provider appointments Call provider office for new concerns or questions  Work with the social worker to address care coordination needs and will continue to work with the clinical team to address health care and disease management related needs call 1-800-273-TALK (toll free, 24 hour hotline) if experiencing a Mental Health or Behavioral Health Crisis   Follow Up Plan:  Telephone follow up appointment with care management team member scheduled for:  03/09/23 @ 9am      Follow Up:  Patient agrees to Care Plan and Follow-up.  Plan: The Managed Medicaid care management team will reach out to the patient again over the next 60 days.  Date/time of next scheduled RN care management/care coordination outreach:  03/09/23 @ 9am  Estanislado Emms RN, BSN [place]  Managed [facility] RN Care Coordinator [phone]

## 2023-01-17 ENCOUNTER — Other Ambulatory Visit: Payer: Medicaid Other | Admitting: Licensed Clinical Social Worker

## 2023-01-17 NOTE — Patient Outreach (Signed)
Medicaid Managed Care Social Work Note  01/17/2023 Name:  Chris [name] MRN:  [id number] DOB:  1960-10-28  Chris [name] is an 62 y.o. year old male who is a primary patient of [name], MD.  The Medicaid Managed Care Coordination team was consulted for assistance with:  Mental Health Counseling and Resources  Chris [name] was given information about Medicaid Managed Care Coordination team services today. Chris [name] Patient agreed to services and verbal consent obtained.  Engaged with patient  for by telephone forfollow up visit in response to referral for case management and/or care coordination services.   Assessments/Interventions:  Review of past medical history, allergies, medications, health status, including review of consultants reports, laboratory and other test data, was performed as part of comprehensive evaluation and provision of chronic care management services.  SDOH: (Social Determinant of Health) assessments and interventions performed: SDOH Interventions    Flowsheet Row Patient Outreach Telephone from 01/17/2023 in [place] POPULATION HEALTH DEPARTMENT Telephone from 12/23/2022 in [place] POPULATION HEALTH DEPARTMENT Patient Outreach Telephone from 12/17/2022 in [place] POPULATION HEALTH DEPARTMENT Patient Outreach Telephone from 11/10/2022 in [place] POPULATION HEALTH DEPARTMENT Patient Outreach Telephone from 10/25/2022 in [place] POPULATION HEALTH DEPARTMENT Patient Outreach Telephone from 10/20/2022 in [place] POPULATION HEALTH DEPARTMENT  SDOH Interventions        Transportation Interventions -- Payor Benefit -- -- -- Payor Benefit  Stress Interventions Offered [employer], Provide Counseling -- Offered [employer], Provide Counseling Offered [employer], Provide Counseling  [I am feeling better since I seen my doctor] Provide Counseling, Offered [employer] --        Advanced Directives Status:  See Care Plan for related entries.  Care Plan                 No Known Allergies  Medications Reviewed Today   Medications were not reviewed in this encounter     Patient Active Problem List   Diagnosis Date Noted   Post traumatic stress disorder 09/23/2022   MDD (major depressive disorder), recurrent episode, moderate (HCC) 08/26/2022   Stage 3b chronic kidney disease (HCC) 08/09/2022   Hypertrophic cardiomyopathy (HCC) 08/09/2022   History of renal cell cancer 08/09/2022   Chronic heart failure with preserved ejection fraction (HFpEF) (HCC) 07/13/2022   Agitation 04/23/2022   RSV (respiratory syncytial virus pneumonia) 04/22/2022   Obesity (BMI 30-39.9) 04/21/2022   COPD exacerbation (HCC) 04/19/2022   Dyslipidemia 04/19/2022   Gout 04/19/2022   Right kidney mass 05/14/2021   CHF exacerbation (HCC) 04/14/2021   Chest pain 04/14/2021   Syncope 04/14/2021   Left-sided weakness 10/28/2020   Typical atrial flutter (HCC)    CHF (congestive heart failure) (HCC) 07/04/2020   Acute exacerbation of CHF (congestive heart failure) (HCC) 06/16/2020   Influenza vaccine refused 05/06/2020   Acute decompensated heart failure (HCC) 05/04/2020   Illiteracy 05/04/2020   Type 2 diabetes mellitus with stage 3 chronic kidney disease (HCC) 12/25/2019   Elevated troponin I level 10/26/2019   History of gout 02/01/2019   Seasonal allergic rhinitis due to pollen 02/01/2019   Nicotine use disorder 11/30/2018   Microscopic hematuria 11/30/2018   Depression 11/30/2018   Difficulty controlling anger 11/30/2018   COPD (chronic obstructive pulmonary disease) (HCC)    CKD (chronic kidney disease) stage 3, GFR 30-59 ml/min (HCC) 08/10/2018   Recurrent epistaxis 04/21/2018   Mixed hyperlipidemia 07/28/2017   Essential hypertension 07/28/2017   Chronic systolic  heart failure (HCC) 10/25/2014   Cocaine abuse (HCC) 02/20/2013   Cannabis abuse 02/20/2013   Back  pain, chronic 02/20/2013    Conditions to be addressed/monitored per PCP order:  Depression  Care Plan : General Social Work (Adult)  Updates made by Chris Bryant, LCSW since 01/17/2023 12:00 AM     Problem: Depression Identification (Depression)      Long-Range Goal: To increase my self care and gain mental health support   Start Date: 07/08/2022  Priority: High  Note:   Timeframe:  Long-Range Goal Priority:  High Start Date:   07/08/22                    Expected End Date:ongoing    Follow Up Date- 02/03/23 at 9 am   - begin personal counseling - call and visit an old friend - check out volunteer opportunities - join a support group - laugh; watch a funny movie or comedian - learn and use visualization or guided imagery - perform a random act of kindness - practice relaxation or meditation daily - start or continue a personal journal - talk about feelings with a friend, family or spiritual advisor - practice positive thinking and self-talk    Why is this important?   When you are stressed, down or upset, your body reacts too.  For example, your blood pressure may get higher; you may have a headache or stomachache.  When your emotions get the best of you, your body's ability to fight off cold and flu gets weak.  These steps will help you manage your emotions.   Current Barriers:  Chronic Mental Health needs related to depression and need for housing Limited social support, Housing barriers, and Mental Health Concerns  Social Isolation ADL IADL limitations Suicidal Ideation/Homicidal Ideation: No  Clinical Social Work Goal(s):  Over the next 120 days, patient will work with LCSW monthly by telephone or in person to reduce or manage symptoms related to depression and relapse prevention patient will work with [name] to address needs related to financial, food and transportation support. Patient wishes to offset medical and housing expenses with financial support as housing  resources are very limited at this time  Interventions: Patient interviewed and appropriate assessments performed: brief mental health assessment Discussed plans with patient for ongoing care management follow up and provided patient with direct contact information for care management team Assisted patient/caregiver with obtaining information about health plan benefits Patient was educated on the Advance Directives process. Patient was encouraged to complete document with PCP.  Advance Directive education was provided to patient again on 07/08/22 and 08/19/22 Encouraged patient to consider a mental health provider for long term follow up and therapy/counseling and patient is now agreeable. Referral made to [facility] for both counseling and psychiatry.  Patient reports recent agitation due to his inability to meet his daily mental and physical needs. [facility] LCSW provided emotional support and reflective listening.  Solution-Focused Strategies, Mindfulness or Relaxation Training, Active listening / Reflection utilized , Emotional Supportive Provided, and Verbalization of feelings encouraged  Patient declines any current substance use. Patient has a history of cocaine abuse. Relapse prevention education provided.  Patient reports that he has developed social anxiety. He admits that he gets irritable at times and will snap at his loved ones and then will experience guilt afterwards. Educated patient on coping methods to implement into his daily life to combat anxiety symptoms and stress. Patient denied any current suicidal or homicidal ideations. Encouraged  patient to implement deep breathing and grounding exercises into his daily routine due to ongoing anxiety and SOB.  Patient reports that he will start implementing appropriate self-care habits into his daily routine such as: drinking water, staying active around the house, taking his medications as prescribed, combating negative thoughts or emotions and staying  connected with his support network. Positive reinforcement provided.  [facility] LCSW made referral for [facility] [name] and [facility] RNCM involvement. Message sent to entire team by [facility] LCSW. 07/27/22 LCSW update- Patient and [facility] LCSW contacted [facility] together and scheduled him for both psychiatry and counseling next month. Patient's counseling is virtual so he will not need transportation assistance for this appointment only for psychiatry. [facility] LCSW will update [facility] [name]. [facility] LCSW had patient physically write down his appointment reminders as he is having issues with his memory. Self-care education provided. [facility] LCSW 08/19/22 update- Patient and [facility] LCSW contacted [facility] together and left a message regarding his upcoming appointments as he has scheduled transportation for BOTH appointments even though of one those appointments is virtual. However, he has not filled out their intake paperwork and will need a ride to do this in person. [facility] LCSW encouraged patient to keep trying to reach [facility] in order to gain appropriate instructions on what to expect during his first visit. 08/27/22 update- Patient was able to successfully attend his psychiatry appointment yesterday. He was reminded of his upcoming appointments at [facility] for next month. He reports being in a lot of pain which has triggered his stress. Emotional support and resource education provided. LCSW update- Patient successfully went to [facility] for therapy and has a scheduled psychiatry visit next week. Patient reports that self care and stress management tools are not effectively working for him due to the amount of pain he is in. Care coordination completed with [facility] RNCM today. Update- Patient has upcoming initial therapy appointment on 5/16 at 3 pm and was advised to get there early to do their enrollment paperwork. He has an overlapping appointment that same day at 4:15 in [place] and was advised to let his care providers know. Crisis support resources reviewed as well. Update-  Patient was provided positive feedback for attending cardiologist, PCP, counseling and psychiatry appointments over the last 30 days. Patient has follow up psychiatry appointment tomorrow and confirms stable transportation to this. He reports that his new medicines that his psychiatrist prescribes are helping especially regarding his sleep at night due to his chronic pain. Update- Patient is currently at the ED for back pain. He reports that he is in a lot of pain and it is triggering his mood management. Patient was having difficulty answering [facility] LCSW's questions due to his current state and [facility] LCSW advised him that she will contact him on 12/17/22 to follow up on this ED visit. Brief pain management education and emotional support provided. LCSW update- Patient successfully attended psychiatry appt today at [facility]. He reports really liking his mental health providers and care management team and feels it has helped decrease his loneliness and depression. He reports that his mood management has improved as well. Brief self-care education provided for SOB/anxiety flare ups during the hot summer months. Update- Patient reports being able to keep up with all of his physical and mental health appointments. He shares that his mood management continues to improve with the added mental health support that he has at [facility]. Patient reports his main concern in the floor in his house and wishes for [facility] [name] to follow  back up with him as the resource she provided for this home repair assistance if only for home owners. [facility] LCSW reported that there may be no other resources for this as he does not own his home and will probably have to go through his landlord but [facility] LCSW will ask [facility] [name]. [facility] LCSW will follow up within 30 days. 09/15/21: [name] completed telephone outreach with patient for utility assistance. Patient stated that he has a cut off notice for 09/25/21 and his bill is $700.00. [name] informed patient he can go to the  [facility] and can also [employer]. [name] provided patient with the telephone number for [employer]. Patient stated no other resources are needed at this time.   Patient Self Care Activities:  Ability for insight Agreement to start mental health treatment and to the self-care journey   Patient Coping Strengths:  Self Advocate Able to Communicate Effectively  Patient Self Care Deficits:  Lacks social connections  The following coping skill education was provided for stress relief and mental health management: "When your car dies or a deadline looms, how do you respond? Long-term, low-grade or acute stress takes a serious toll on your body and mind, so don't ignore feelings of constant tension. Stress is a natural part of life. However, too much stress can harm our health, especially if it continues every day. This is chronic stress and can put you at risk for heart problems like heart disease and depression. Understand what's happening inside your body and learn simple coping skills to combat the negative impacts of everyday stressors.  Types of Stress There are two types of stress: Emotional - types of emotional stress are relationship problems, pressure at work, financial worries, experiencing discrimination or having a major life change. Physical - Examples of physical stress include being sick having pain, not sleeping well, recovery from an injury or having an alcohol and drug use disorder. Fight or Flight Sudden or ongoing stress activates your nervous system and floods your bloodstream with adrenaline and cortisol, two hormones that raise blood pressure, increase heart rate and spike blood sugar. These changes pitch your body into a fight or flight response. That enabled our ancestors to outrun saber-toothed tigers, and it's helpful today for situations like dodging a car accident. But most modern chronic stressors, such as finances or a  challenging relationship, keep your body in that heightened state, which hurts your health. Effects of Too Much Stress If constantly under stress, most of [place] will eventually start to function less well.  Multiple studies link chronic stress to a higher risk of heart disease, stroke, depression, weight gain, memory loss and even premature death, so it's important to recognize the warning signals. Talk to your doctor about ways to manage stress if you're experiencing any of these symptoms: Prolonged periods of poor sleep. Regular, severe headaches. Unexplained weight loss or gain. Feelings of isolation, withdrawal or worthlessness. Constant anger and irritability. Loss of interest in activities. Constant worrying or obsessive thinking. Excessive alcohol or drug use. Inability to concentrate.  10 Ways to Cope with Chronic Stress It's key to recognize stressful situations as they occur because it allows you to focus on managing how you react. We all need to know when to close our eyes and take a deep breath when we feel tension rising. Use these tips to prevent or reduce chronic stress. 1. Rebalance Work and Home All work and no play? If you're spending too much  time at the office, intentionally put more dates in your calendar to enjoy time for fun, either alone or with others. 2. Get Regular Exercise Moving your body on a regular basis balances the nervous system and increases blood circulation, helping to flush out stress hormones. Even a daily 20-minute walk makes a difference. Any kind of exercise can lower stress and improve your mood ? just pick activities that you enjoy and make it a regular habit. 3. Eat Well and Limit Alcohol and Stimulants Alcohol, nicotine and caffeine may temporarily relieve stress but have negative health impacts and can make stress worse in the long run. Well-nourished bodies cope better, so start with a good breakfast, add more organic fruits and vegetables for a  well-balanced diet, avoid processed foods and sugar, try herbal tea and drink more water. 4. Connect with Supportive People Talking face to face with another person releases hormones that reduce stress. Lean on those good listeners in your life. 5. Carve Out Hobby Time Do you enjoy gardening, reading, listening to music or some other creative pursuit? Engage in activities that bring you pleasure and joy; research shows that reduces stress by almost half and lowers your heart rate, too. 6. Practice Meditation, Stress Reduction or Yoga Relaxation techniques activate a state of restfulness that counterbalances your body's fight-or-flight hormones. Even if this also means a 10-minute break in a long day: listen to music, read, go for a walk in nature, do a hobby, take a bath or spend time with a friend. Also consider doing a mindfulness exercise or try a daily deep breathing or imagery practice. Deep Breathing Slow, calm and deep breathing can help you relax. Try these steps to focus on your breathing and repeat as needed. Find a comfortable position and close your eyes. Exhale and drop your shoulders. Breathe in through your nose; fill your lungs and then your belly. Think of relaxing your body, quieting your mind and becoming calm and peaceful. Breathe out slowly through your nose, relaxing your belly. Think of releasing tension, pain, worries or distress. Repeat steps three and four until you feel relaxed. Imagery This involves using your mind to excite the senses -- sound, vision, smell, taste and feeling. This may help ease your stress. Begin by getting comfortable and then do some slow breathing. Imagine a place you love being at. It could be somewhere from your childhood, somewhere you vacationed or just a place in your imagination. Feel how it is to be in the place you're imagining. Pay attention to the sounds, air, colors, and who is there with you. This is a place where you feel cared for and  loved. All is well. You are safe. Take in all the smells, sounds, tastes and feelings. As you do, feel your body being nourished and healed. Feel the calm that surrounds you. Breathe in all the good. Breathe out any discomfort or tension. 7. Sleep Enough If you get less than seven to eight hours of sleep, your body won't tolerate stress as well as it could. If stress keeps you up at night, address the cause, and add extra meditation into your day to make up for the lost z's. Try to get seven to nine hours of sleep each night. Make a regular bedtime schedule. Keep your room dark and cool. Try to avoid computers, TV, cell phones and tablets before bed. 8. Bond with Connections You Enjoy Go out for a coffee with a friend, chat with a neighbor, call a family  member, visit with a clergy member, or even hang out with your pet. Clinical studies show that spending even a short time with a companion animal can cut anxiety levels almost in half. 9. Take a Vacation Getting away from it all can reset your stress tolerance by increasing your mental and emotional outlook, which makes you a happier, more productive person upon return. Leave your cellphone and laptop at home! 10. See a Counselor, Coach or Therapist If negative thoughts overwhelm your ability to make positive changes, it's time to seek professional help. Make an appointment today--your health and life are worth it."     24- Hour Availability:    [facility]  [address] [place], [place] [phone] Crisis [phone]   [facility] [phone]   [place] Crisis [facility]  [phone]    [facility]  [phone] (after hours)   Therapeutic Alternative/Mobile Crisis   [phone]   [place] National Suicide Hotline  334-839-8318 [place] Child[place]) [place] [address]   Call 911 or go to emergency room   [facility]  [phone]);  [place] and [employer]  [phone]); [place], [place], [place], [place], [place], [place], [place]         Follow up goal      Follow up:  Patient agrees to Care Plan and Follow-up.  Plan: The Managed Medicaid care management team will reach out to the patient again over the next 30 days.  Dickie La, [name], MSW, [place] [employer] [place] Managed Medicaid LCSW [facility]  [facility] [email or web address] Phone: [phone]

## 2023-01-18 NOTE — Patient Instructions (Signed)
Visit Information  Mr. Latendresse was given information about Medicaid Managed Care team care coordination services as a part of their Healthy [facility] Medicaid benefit. ARCHIMEDES TASSINARI verbally consented to engagement with the [facility] Managed Care team.   If you are experiencing a medical emergency, please call 911 or report to your local emergency department or urgent care.   If you have a non-emergency medical problem during routine business hours, please contact your provider's office and ask to speak with a nurse.   For questions related to your Healthy [facility] health plan, please call: [phone] or visit the homepage here: [email or web address]  If you would like to schedule transportation through your Healthy [facility] plan, please call the following number at least 2 days in advance of your appointment: [phone]  For information about your ride after you set it up, call Ride Assist at [phone]. Use this number to activate a Will Call pickup, or if your transportation is late for a scheduled pickup. Use this number, too, if you need to make a change or cancel a previously scheduled reservation.  If you need transportation services right away, call [phone]. The after-hours call center is staffed 24 hours to handle ride assistance and urgent reservation requests (including discharges) 365 days a year. Urgent trips include sick visits, hospital discharge requests and life-sustaining treatment.  Call the [place] at [phone], at any time, 24 hours a day, 7 days a week. If you are in danger or need immediate medical attention call 911.  If you would like help to quit smoking, call 1-800-QUIT-NOW (208) 853-5918) OR Espaol: 1-855-Djelo-Ya HD:1601594) o para ms informacin haga clic aqu or Text READY to 200-400 to register via text  Eula Fried, [place], MSW, [employer] Managed Medicaid LCSW [email or web address] Phone: [phone]

## 2023-01-19 ENCOUNTER — Ambulatory Visit (INDEPENDENT_AMBULATORY_CARE_PROVIDER_SITE_OTHER): Payer: Medicaid Other | Admitting: Mental Health

## 2023-01-19 DIAGNOSIS — F4310 Post-traumatic stress disorder, unspecified: Secondary | ICD-10-CM

## 2023-01-19 DIAGNOSIS — M25551 Pain in right hip: Secondary | ICD-10-CM | POA: Diagnosis not present

## 2023-01-19 DIAGNOSIS — M25552 Pain in left hip: Secondary | ICD-10-CM | POA: Diagnosis not present

## 2023-01-19 DIAGNOSIS — F331 Major depressive disorder, recurrent, moderate: Secondary | ICD-10-CM

## 2023-01-19 DIAGNOSIS — M479 Spondylosis, unspecified: Secondary | ICD-10-CM | POA: Diagnosis not present

## 2023-01-19 DIAGNOSIS — Z6837 Body mass index (BMI) 37.0-37.9, adult: Secondary | ICD-10-CM | POA: Diagnosis not present

## 2023-01-19 DIAGNOSIS — E6609 Other obesity due to excess calories: Secondary | ICD-10-CM | POA: Diagnosis not present

## 2023-01-19 NOTE — Progress Notes (Signed)
   THERAPIST PROGRESS NOTE  Session Time: 8:05 ( 30 minutes)  Participation Level: Active  Behavioral Response: CasualAlertAdequate  Type of Therapy: Individual Therapy  Treatment Goals addressed:  STG: "My anger" [name] will decrease sxs of depression/irritability AEB development of x 3 emotional regulation skills with ability to process emotions in healthy manner per self report within the next 6 months   ProgressTowards Goals: Progressing  Interventions: CBT and Supportive  Summary:  [name] is a 62 y.o. male who presents with dx of MDD severe and PTSD. Presents for therapy alert and oriented; mod and affect stable; adequate. Speech clear and coherent at normal rate and tone. Shares for feelings of depression to have reduced and shares feelings of being more calm. Shares improvements in sleeping patterns as well. Shares event with therapist of interaction with sister and engaging in church community activities. Shares desire to feed children at motel which was approved in which later funds were designated for something else. Notes feelings of frustration and discussing this with sister. Shares to have also apologized to [employer]. Shares to feel at times he is provoked and thoughts of others not doing his family church right; "Making my grand-father look bad." Notes to have also presented to family reunion and did ok but largely kept to himself. Notes trying to find joy in life and see "the beautiful." Shares can have difficulty with thoughts of what others think of him and thoughts of "would of could of should of." Able to process with therapist working to focus on present and ability to control emotions in regards to anger with others. Denies safety concerns. Progress with goals reporting increased ability to engage in cognitive coping and regulating emotions.   Suicidal/Homicidal: Nowithout intent/plan  Therapist Response: Therapist engaged [name] in therapy session. Provided safe  space to share thoughts and feelings in regards to current stressors and sxs of depression and PTSD. Explored current improvements in mood and areas of progress. Discussed working to process ability to regulate emotions and working through feelings of anger and irritability. Supported in working to engage in cognitive coping and working to improve cognitions and effective coping skills. Encouraged engagement in enjoyable activities and working on balanced thoughts. Reviewed session provided follow up. No safety concerns reported.   Plan: Return again in  x 7 weeks.  Diagnosis: MDD (major depressive disorder), recurrent episode, moderate (HCC)  Post traumatic stress disorder  Collaboration of Care: Other None  Patient/Guardian was advised Release of Information must be obtained prior to any record release in order to collaborate their care with an outside provider. Patient/Guardian was advised if they have not already done so to contact the registration department to sign all necessary forms in order for [place] to release information regarding their care.   Consent: Patient/Guardian gives verbal consent for treatment and assignment of benefits for services provided during this visit. Patient/Guardian expressed understanding and agreed to proceed.   Stephan Minister [place], [facility] 01/19/2023

## 2023-01-20 DIAGNOSIS — M25551 Pain in right hip: Secondary | ICD-10-CM | POA: Diagnosis not present

## 2023-01-20 DIAGNOSIS — M25552 Pain in left hip: Secondary | ICD-10-CM | POA: Diagnosis not present

## 2023-01-25 ENCOUNTER — Ambulatory Visit: Payer: Medicaid Other | Admitting: Genetic Counselor

## 2023-01-28 ENCOUNTER — Ambulatory Visit (HOSPITAL_COMMUNITY): Payer: Medicaid Other | Admitting: Student

## 2023-01-28 DIAGNOSIS — F4310 Post-traumatic stress disorder, unspecified: Secondary | ICD-10-CM

## 2023-01-28 DIAGNOSIS — F17200 Nicotine dependence, unspecified, uncomplicated: Secondary | ICD-10-CM

## 2023-01-28 DIAGNOSIS — F331 Major depressive disorder, recurrent, moderate: Secondary | ICD-10-CM

## 2023-01-28 MED ORDER — HYDROXYZINE HCL 25 MG PO TABS
ORAL_TABLET | ORAL | 1 refills | Status: DC
Start: 2023-01-28 — End: 2023-03-24

## 2023-01-28 MED ORDER — MIRTAZAPINE 15 MG PO TABS
15.0000 mg | ORAL_TABLET | Freq: Every day | ORAL | 1 refills | Status: DC
Start: 2023-01-28 — End: 2023-03-24

## 2023-01-28 NOTE — Progress Notes (Signed)
BH MD Outpatient Progress Note  01/28/2023 9:15 AM [name]  MRN:  [id number]  Assessment:  Patient reports improved mood and mildly less anxiety since starting mirtazapine. Sleep initiation is still a problem so plan to start taking hydroxyzine at night. Can also go back to trazodone. Quetiapine was considered but Chris [name] is relatively high still in July 2024.   Identifying Information: Chris [name] is a 62 y.o. y.o. male with a history of depression, hypertension, obesity, HFrEF (initial EF 25%, normalized to 50%) atrial flutter s/p DCCV 06/2020, COPD, current smoker x40+ years, former cocaine use x25+ years, HCM, CKD who is an established patient with [facility] for medication management.   Plan: # Major Depressive Disorder-recurrent episode, moderate Past medication trials:  Status of problem: improving Interventions: -- Continue mirtazapine 15 mg at bedtime for depression, insomnia, and appetite stimulation -- Continue hydroxyzine 25 mg tid prn for anxiety -- START hydroxyzine 50 mg at bedtime as needed for sleep  #Nicotine use disorder --Encourage cessation   Patient was given contact information for behavioral health clinic and was instructed to call 911 for emergencies.   Subjective:  Chief Complaint:  Chief Complaint  Patient presents with   Medication Management    Interval History:  Patient reports depression has improved since last seeing him.  He does report sleep problems still mainly with sleep initiation.  He reports that once he actually falls asleep he stays sound asleep.  He states he is gets approximately 4 hours per night.  We discussed medication options and patient has elected to use hydroxyzine at 50 mg at night to aid with sleep.  He reports he occasionally uses hydroxyzine as needed for anxiety and has had some benefit.  He continues to deny SI/HI/AVH.  He reports appetite has been fair. All questions addressed.     Visit  Diagnosis:    ICD-10-CM   1. MDD (major depressive disorder), recurrent episode, moderate (HCC)  F33.1 hydrOXYzine (ATARAX) 25 MG tablet       Past Psychiatric History:  Diagnoses: none Medication trials: duloxetine Previous psychiatrist/therapist: none Hospitalizations: none Suicide attempts: denies SIB: denies Hx of violence towards others: denies Current access to guns: denies  Past Medical History:  Past Medical History:  Diagnosis Date   Arrhythmia    atrial flutter   CHF (congestive heart failure) (HCC)    Chronic kidney disease    COPD (chronic obstructive pulmonary disease) (HCC)    Coronary artery disease    Depression    Diabetes mellitus without complication (HCC)    GERD (gastroesophageal reflux disease)    Gout    Hypertension    Influenza A with respiratory manifestations    Mental disorder     Past Surgical History:  Procedure Laterality Date   ANKLE SURGERY     CARDIAC CATHETERIZATION     CARDIOVERSION N/A 07/08/2020   Procedure: CARDIOVERSION;  Surgeon: Chris Blinks, MD;  Location: [name];  Service: Cardiovascular;  Laterality: N/A;   COLONOSCOPY WITH PROPOFOL N/A 11/19/2021   Procedure: COLONOSCOPY WITH PROPOFOL;  Surgeon: Chris Lucks, MD;  [name];  Service: Gastroenterology;  Laterality: N/A;   HERNIA REPAIR     x2   IR RADIOLOGIST EVAL & MGMT  05/13/2022   IR RADIOLOGIST EVAL & MGMT  10/27/2022   POLYPECTOMY  11/19/2021   Procedure: POLYPECTOMY;  Surgeon: Chris Lucks, MD;  Location: [name];  Service: Gastroenterology;;   RADIOLOGY WITH ANESTHESIA N/A 06/23/2022  Procedure: Right renal tumor ablation;  Surgeon: Chris Dallas, MD;  Location: [name];  Service: Radiology;  Laterality: N/A;   RIGHT HEART CATH N/A 07/13/2022   Procedure: RIGHT HEART CATH;  Surgeon: Chris Morale, MD;  Location: [name];  Service: Cardiovascular;  Laterality: N/A;   RIGHT/LEFT HEART CATH AND CORONARY ANGIOGRAPHY N/A  11/19/2022   Procedure: RIGHT/LEFT HEART CATH AND CORONARY ANGIOGRAPHY;  Surgeon: Chris Morale, MD;  Location: [name];  Service: Cardiovascular;  Laterality: N/A;   SHOULDER SURGERY     TEE WITHOUT CARDIOVERSION N/A 07/08/2020   Procedure: TRANSESOPHAGEAL ECHOCARDIOGRAM (TEE);  Surgeon: Chris Blinks, MD;  Location: [name];  Service: Cardiovascular;  Laterality: N/A;    Family History:  Family History  Problem Relation Age of Onset   Heart disease Father    Diabetes Mother    HIV Brother    Healthy Son    Healthy Daughter     Social History:  Social History   Socioeconomic History   Marital status: Divorced    Spouse name: Not on file   Number of children: 3   Years of education: Not on file   Highest education level: High school graduate  Occupational History   Occupation: disability  Tobacco Use   Smoking status: Every Day    Current packs/day: 1.00    Average packs/day: 1 pack/day for 43.0 years (43.0 ttl pk-yrs)    Types: Cigarettes   Smokeless tobacco: Never   Tobacco comments:        4 cigs daily--05/12/2022  Vaping Use   Vaping status: Never Used  Substance and Sexual Activity   Alcohol use: Yes    Alcohol/week: 4.0 standard drinks of alcohol    Types: 4 Shots of liquor per week   Drug use: Yes    Frequency: 21.0 times per week    Types: Marijuana    Comment: last use Cocaine- 03/28/2021. Still using marijuana, last use 06/22/21   Sexual activity: Not on file  Other Topics Concern   Not on file  Social History Narrative   ** Merged History Encounter **       Social Determinants of Health   Financial Resource Strain: Medium Risk (07/23/2022)   Overall Financial Resource Strain (CARDIA)    Difficulty of Paying Living Expenses: Somewhat hard  Food Insecurity: No Food Insecurity (04/20/2022)   Hunger Vital Sign    Worried About Running Out of Food in the Last Year: Never true    Ran Out of Food in the Last Year: Never true   Transportation Needs: Unmet Transportation Needs (12/23/2022)   PRAPARE - Administrator, Civil Service (Medical): Yes    Lack of Transportation (Non-Medical): Yes  Physical Activity: Insufficiently Active (11/16/2021)   Exercise Vital Sign    Days of Exercise per Week: 5 days    Minutes of Exercise per Session: 20 min  Stress: Stress Concern Present (01/17/2023)   [employer] of Occupational Health - Occupational Stress Questionnaire    Feeling of Stress : To some extent  Social Connections: Moderately Isolated (07/23/2022)   Social Connection and Isolation Panel [NHANES]    Frequency of Communication with Friends and Family: Twice a week    Frequency of Social Gatherings with Friends and Family: Once a week    Attends Religious Services: More than 4 times per year    Active Member of [employer] [name] Organizations: No    Attends Banker  Meetings: Never    Marital Status: Divorced    Allergies: No Known Allergies  Current Medications: Current Outpatient Medications  Medication Sig Dispense Refill   Accu-Chek Softclix Lancets lancets Use as instructed 100 each 12   acetaminophen (TYLENOL) 500 MG tablet Take 1-2 tablets (500-1,000 mg total) by mouth every 6 (six) hours as needed (pain.). 30 tablet 0   albuterol (PROVENTIL) (2.5 MG/3ML) 0.083% nebulizer solution Take 3 mLs (2.5 mg total) by nebulization every 4 (four) hours as needed for wheezing [name] shortness of breath. 300 mL 2   albuterol (VENTOLIN HFA) 108 (90 Base) MCG/ACT inhaler Inhale 2 puffs into the lungs every 6 (six) hours as needed for wheezing [name] shortness of breath. 54 g 6   allopurinol (ZYLOPRIM) 100 MG tablet TAKE 2 TABLETS (200 MG TOTAL) BY MOUTH DAILY. 60 tablet 2   apixaban (ELIQUIS) 5 MG TABS tablet Take 1 tablet (5 mg total) by mouth 2 (two) times daily. 60 tablet 11   atorvastatin (LIPITOR) 40 MG tablet Take 1 tablet (40 mg total) by mouth daily. 90 tablet 3   atorvastatin (LIPITOR) 80 MG tablet  Take 1 tablet (80 mg total) by mouth daily. 90 tablet 1   baclofen (LIORESAL) 10 MG tablet Take 1 tablet (10 mg total) by mouth 3 (three) times daily. 30 each 0   budesonide-formoterol (SYMBICORT) 160-4.5 MCG/ACT inhaler Inhale 2 puffs into the lungs 2 (two) times daily. 30.6 g 6   carvedilol (COREG) 12.5 MG tablet Take 1 tablet (12.5 mg total) by mouth 2 (two) times daily. 180 tablet 3   dapagliflozin propanediol (FARXIGA) 10 MG TABS tablet Take 1 tablet (10 mg total) by mouth daily. 90 tablet 3   hydrOXYzine (ATARAX) 25 MG tablet Take 1 tablet 3 times a day as needed for anxiety. Take 2 tablets at night as need for sleep 90 tablet 1   isosorbide-hydrALAZINE (BIDIL) 20-37.5 MG tablet Take 1 tablet by mouth 3 (three) times daily. 270 tablet 3   mirtazapine (REMERON) 15 MG tablet Take 1 tablet (15 mg total) by mouth at bedtime. 30 tablet 1   potassium chloride (KLOR-CON M) 10 MEQ tablet Take 2 tablets (20 mEq total) by mouth daily. 180 tablet 3   Tiotropium Bromide Monohydrate (SPIRIVA RESPIMAT) 2.5 MCG/ACT AERS Inhale 2 puffs into the lungs daily. 4 g 6   torsemide 40 MG TABS Take 20 mg daily, alternating with 40 mg daily. 30 tablet 0   traMADol (ULTRAM) 50 MG tablet Take 1 tablet (50 mg total) by mouth every 6 (six) hours as needed. 15 tablet 0   No current facility-administered medications for this visit.    ROS:   Objective:  Psychiatric Specialty Exam: There were no vitals taken for this visit.There is no height [name] weight on file to calculate BMI.  General Appearance: Casual  Eye Contact:  Good  Speech:  Clear and Coherent  Volume:  Normal  Mood:  Anxious and Depressed  Affect:  Appropriate and Constricted  Thought Process:  Coherent, Goal Directed, and Linear  Orientation:  Full (Time, Place, and Person)  Thought Content: Logical   Suicidal Thoughts:  No  Homicidal Thoughts:  No  Memory:  Negative  Judgment:  Intact  Insight:  Fair  Psychomotor Activity:  Normal   Concentration:  Concentration: Fair              Assets:  Communication Skills Desire for Improvement Financial Resources/Insurance Housing  ADL's:  Intact  Cognition: WNL  Sleep:  Poor   PE: General: fatigued, fairly groomed Pulm: no increased work of breathing on room air  Strength & Muscle Tone: within normal limits Neuro: no focal neurological deficits observed  Gait & Station: slight limp when walking  Metabolic Disorder Labs: Lab Results  Component Value Date   HGBA1C 6.1 (H) 01/05/2023   MPG 131.24 04/20/2022   MPG 151.33 02/09/2021   No results found for: "PROLACTIN" Lab Results  Component Value Date   CHOL 181 12/22/2022   TRIG 146 12/22/2022   HDL 27 (L) 12/22/2022   CHOLHDL 6.7 12/22/2022   VLDL 29 12/22/2022   LDLCALC 125 (H) 12/22/2022   LDLCALC 121 (H) 12/25/2019   Lab Results  Component Value Date   TSH 1.592 10/26/2019   TSH 0.842 11/30/2018    Therapeutic Level Labs: No results found for: "LITHIUM" No results found for: "VALPROATE" No results found for: "CBMZ"  Screenings: AUDIT    Flowsheet Row [name]  Alcohol Use Disorder Identification Test Final Score (AUDIT) 1      GAD-7    Flowsheet Row Office Visit from 01/05/2023 in [facility] from 09/23/2022 in [facility] Office Visit from 08/09/2022 in [name] Office Visit from 05/11/2022 in [name] Office Visit from 12/21/2021 in [name]  Total GAD-7 Score 0 20 15 16 8       PHQ2-9    Flowsheet Row Office Visit from 01/05/2023 in [facility] Counselor from 09/23/2022 in [name] Office Visit from 08/26/2022 in [name] Office Visit from  08/09/2022 in [name] Patient Outreach Telephone from 07/08/2022 in [place] POPULATION HEALTH DEPARTMENT  PHQ-2 Total Score 0 6 5 5 4   PHQ-9 Total Score -- 21 17 17 16       Flowsheet Row ED from 12/20/2022 in [facility] Emergency Department at [facility] ED from 12/09/2022 in [facility] Department at [facility] Admission (Discharged) from 11/19/2022 in [facility] CARDIAC CATH LAB  C-SSRS RISK CATEGORY No Risk No Risk No Risk       Collaboration of Care: Collaboration of Care:   Patient/Guardian was advised Release of Information must be obtained prior to any record release in order to collaborate their care with an outside provider. Patient/Guardian was advised if they have not already done so to contact the registration department to sign all necessary forms in order for [place] to release information regarding their care.   Consent: Patient/Guardian gives verbal consent for treatment and assignment of benefits for services provided during this visit. Patient/Guardian expressed understanding and agreed to proceed.   A total of 30 minutes was spent involved in face to face clinical care, chart review, and documentation.   Park Pope, MD 01/28/2023, 9:15 AM

## 2023-01-28 NOTE — Patient Instructions (Signed)
Take 2 tablets of hydroxyzine at night with your mirtazapine to help with sleep.

## 2023-01-31 ENCOUNTER — Ambulatory Visit: Payer: Medicaid Other | Admitting: Cardiovascular Disease

## 2023-02-01 ENCOUNTER — Other Ambulatory Visit: Payer: Self-pay

## 2023-02-01 ENCOUNTER — Ambulatory Visit: Payer: Medicaid Other | Attending: Genetic Counselor | Admitting: Genetic Counselor

## 2023-02-01 DIAGNOSIS — J449 Chronic obstructive pulmonary disease, unspecified: Secondary | ICD-10-CM

## 2023-02-02 ENCOUNTER — Other Ambulatory Visit: Payer: Self-pay | Admitting: Interventional Radiology

## 2023-02-02 ENCOUNTER — Ambulatory Visit (HOSPITAL_COMMUNITY): Payer: Medicaid Other | Admitting: Mental Health

## 2023-02-02 DIAGNOSIS — C642 Malignant neoplasm of left kidney, except renal pelvis: Secondary | ICD-10-CM

## 2023-02-03 ENCOUNTER — Other Ambulatory Visit: Payer: Medicaid Other | Admitting: Licensed Clinical Social Worker

## 2023-02-03 ENCOUNTER — Ambulatory Visit: Payer: Medicaid Other | Admitting: Cardiovascular Disease

## 2023-02-03 NOTE — Patient Outreach (Addendum)
Medicaid Managed Care Social Work Note  02/03/2023 Name:  Chris [name] MRN:  [id number] DOB:  Chris [name], Chris [name]  Chris [name] is an 62 y.o. year old male who is a primary patient of [name], MD.  The Medicaid Managed Care Coordination team was consulted for assistance with:  Mental Health Counseling and Resources  Chris [name] was given information about Medicaid Managed Care Coordination team services today. Chris [name] Patient agreed to services and verbal consent obtained.  Engaged with patient  for by telephone forfollow up visit in response to referral for case management and/or care coordination services.   Assessments/Interventions:  Review of past medical history, allergies, medications, health status, including review of consultants reports, laboratory and other test data, was performed as part of comprehensive evaluation and provision of chronic care management services.  SDOH: (Social Determinant of Health) assessments and interventions performed: SDOH Interventions    Flowsheet Row Patient Outreach Telephone from 01/17/2023 in [place] POPULATION HEALTH DEPARTMENT Telephone from 12/23/2022 in [place] POPULATION HEALTH DEPARTMENT Patient Outreach Telephone from 12/17/2022 in [place] POPULATION HEALTH DEPARTMENT Patient Outreach Telephone from 11/10/2022 in [place] POPULATION HEALTH DEPARTMENT Patient Outreach Telephone from 10/25/2022 in [place] POPULATION HEALTH DEPARTMENT Patient Outreach Telephone from 10/20/2022 in [place] POPULATION HEALTH DEPARTMENT  SDOH Interventions        Transportation Interventions -- Payor Benefit -- -- -- Payor Benefit  Stress Interventions Offered [employer], Provide Counseling -- Offered [employer], Provide Counseling Offered [employer], Provide Counseling  [I am feeling better since I seen my doctor] Provide Counseling, Offered [employer] --        Advanced Directives Status:  See Care Plan for related entries.  Care Plan                 No Known Allergies  Medications Reviewed Today   Medications were not reviewed in this encounter     Patient Active Problem List   Diagnosis Date Noted   Post traumatic stress disorder [name]/04/2023   MDD (major depressive disorder), recurrent episode, moderate (HCC) 08/26/2022   Stage 3b chronic kidney disease (HCC) 08/09/2022   Hypertrophic cardiomyopathy (HCC) 08/09/2022   History of renal cell cancer 08/09/2022   Chronic heart failure with preserved ejection fraction (HFpEF) (HCC) 07/13/2022   Agitation 04/23/2022   RSV (respiratory syncytial virus pneumonia) 04/22/2022   Obesity (BMI 30-39.9) 04/21/2022   COPD exacerbation (HCC) 04/19/2022   Dyslipidemia 04/19/2022   Gout 04/19/2022   Right kidney mass 05/14/2021   CHF exacerbation (HCC) 04/14/2021   Chest pain 04/14/2021   Syncope 04/14/2021   Left-sided weakness 10/28/2020   Typical atrial flutter (HCC)    CHF (congestive heart failure) (HCC) 07/04/2020   Acute exacerbation of CHF (congestive heart failure) (HCC) 06/16/2020   Influenza vaccine refused 05/06/2020   Acute decompensated heart failure (HCC) 05/04/2020   Illiteracy 05/04/2020   Type 2 diabetes mellitus with stage 3 chronic kidney disease (HCC) 12/25/2019   Elevated troponin I level 10/26/2019   History of gout 02/01/2019   Seasonal allergic rhinitis due to pollen 02/01/2019   Nicotine use disorder 11/30/2018   Microscopic hematuria 11/30/2018   Depression 11/30/2018   Difficulty controlling anger 11/30/2018   COPD (chronic obstructive pulmonary disease) (HCC)    CKD (chronic kidney disease) stage 3, GFR 30-59 ml/min (HCC) 08/10/2018   Recurrent epistaxis 04/21/2018   Mixed hyperlipidemia 07/28/2017   Essential hypertension 07/28/2017   Chronic systolic  heart failure (HCC) 10/25/2014   Cocaine abuse (HCC) 02/20/2013   Cannabis abuse 02/20/2013   Back  pain, chronic 02/20/2013    Conditions to be addressed/monitored per PCP order:  Depression  Care Plan : General Social Work (Adult)  Updates made by Chris Bryant, LCSW since 02/03/2023 12:00 AM     Problem: Depression Identification (Depression)      Long-Range Goal: To increase my self care and gain mental health support   Start Date: 07/08/2022  Priority: High  Note:   Timeframe:  Long-Range Goal Priority:  High Start Date:   07/08/22                    Expected End Date:ongoing    Follow Up Date- 03/04/23 at 9 am   - begin personal counseling - call and visit an old friend - check out volunteer opportunities - join a support group - laugh; watch a funny movie or comedian - learn and use visualization or guided imagery - perform a random act of kindness - practice relaxation or meditation daily - start or continue a personal journal - talk about feelings with a friend, family or spiritual advisor - practice positive thinking and self-talk    Why is this important?   When you are stressed, down or upset, your body reacts too.  For example, your blood pressure may get higher; you may have a headache or stomachache.  When your emotions get the best of you, your body's ability to fight off cold and flu gets weak.  These steps will help you manage your emotions.   Current Barriers:  Chronic Mental Health needs related to depression and need for housing Limited social support, Housing barriers, and Mental Health Concerns  Social Isolation ADL IADL limitations Suicidal Ideation/Homicidal Ideation: No  Clinical Social Work Goal(s):  Over the next 120 days, patient will work with LCSW monthly by telephone or in person to reduce or manage symptoms related to depression and relapse prevention patient will work with [name] to address needs related to financial, food and transportation support. Patient wishes to offset medical and housing expenses with financial support as housing  resources are very limited at this time  Interventions: Patient interviewed and appropriate assessments performed: brief mental health assessment Discussed plans with patient for ongoing care management follow up and provided patient with direct contact information for care management team Assisted patient/caregiver with obtaining information about health plan benefits Patient was educated on the Advance Directives process. Patient was encouraged to complete document with PCP.  Advance Directive education was provided to patient again on 07/08/22 and 08/19/22 Encouraged patient to consider a mental health provider for long term follow up and therapy/counseling and patient is now agreeable. Referral made to [facility] for both counseling and psychiatry.  Patient reports recent agitation due to his inability to meet his daily mental and physical needs. [facility] LCSW provided emotional support and reflective listening.  Solution-Focused Strategies, Mindfulness or Relaxation Training, Active listening / Reflection utilized , Emotional Supportive Provided, and Verbalization of feelings encouraged  Patient declines any current substance use. Patient has a history of cocaine abuse. Relapse prevention education provided.  Patient reports that he has developed social anxiety. He admits that he gets irritable at times and will snap at his loved ones and then will experience guilt afterwards. Educated patient on coping methods to implement into his daily life to combat anxiety symptoms and stress. Patient denied any current suicidal or homicidal ideations. Encouraged  patient to implement deep breathing and grounding exercises into his daily routine due to ongoing anxiety and SOB.  Patient reports that he will start implementing appropriate self-care habits into his daily routine such as: drinking water, staying active around the house, taking his medications as prescribed, combating negative thoughts or emotions and staying  connected with his support network. Positive reinforcement provided.  Past Update- Patient is currently at the ED for back pain. He reports that he is in a lot of pain and it is triggering his mood management. Patient was having difficulty answering [facility] LCSW's questions due to his current state and [facility] LCSW advised him that she will contact him on 12/17/22 to follow up on this ED visit. Brief pain management education and emotional support provided. LCSW update- Patient successfully attended psychiatry appt today at [facility]. He reports really liking his mental health providers and care management team and feels it has helped decrease his loneliness and depression. He reports that his mood management has improved as well. Brief self-care education provided for SOB/anxiety flare ups during the hot summer months. 02/03/23 Update- Pt reports this morning that his Medicaid transportation was "asap transportation services" has now missed 3 of his scheduled medical appointments. He missed a very important therapy appointment and is very upset this morning.[facility] LCSW will make new referral for [facility] [name] involvement. Brief self-care education provided. Patient's mood and sleep routine continues to improve with medication management.  09/15/21: [name] completed telephone outreach with patient for utility assistance. Patient stated that he has a cut off notice for 09/25/21 and his bill is $700.00. [name] informed patient he can go to the [facility] and can also [employer]. [name] provided patient with the telephone number for [employer]. Patient stated no other resources are needed at this time.   Patient Self Care Activities:  Ability for insight Agreement to start mental health treatment and to the self-care journey   Patient Coping Strengths:  Self Advocate Able to Communicate Effectively  Patient Self Care Deficits:  Lacks social connections  The following coping skill education was  provided for stress relief and mental health management: "When your car dies or a deadline looms, how do you respond? Long-term, low-grade or acute stress takes a serious toll on your body and mind, so don't ignore feelings of constant tension. Stress is a natural part of life. However, too much stress can harm our health, especially if it continues every day. This is chronic stress and can put you at risk for heart problems like heart disease and depression. Understand what's happening inside your body and learn simple coping skills to combat the negative impacts of everyday stressors.  Types of Stress There are two types of stress: Emotional - types of emotional stress are relationship problems, pressure at work, financial worries, experiencing discrimination or having a major life change. Physical - Examples of physical stress include being sick having pain, not sleeping well, recovery from an injury or having an alcohol and drug use disorder. Fight or Flight Sudden or ongoing stress activates your nervous system and floods your bloodstream with adrenaline and cortisol, two hormones that raise blood pressure, increase heart rate and spike blood sugar. These changes pitch your body into a fight or flight response. That enabled our ancestors to outrun saber-toothed tigers, and it's helpful today for situations like dodging a car accident. But most modern chronic stressors, such as finances or a challenging relationship, keep your body in that  heightened state, which hurts your health. Effects of Too Much Stress If constantly under stress, most of [place] will eventually start to function less well.  Multiple studies link chronic stress to a higher risk of heart disease, stroke, depression, weight gain, memory loss and even premature death, so it's important to recognize the warning signals. Talk to your doctor about ways to manage stress if you're experiencing any of these symptoms: Prolonged periods of poor  sleep. Regular, severe headaches. Unexplained weight loss or gain. Feelings of isolation, withdrawal or worthlessness. Constant anger and irritability. Loss of interest in activities. Constant worrying or obsessive thinking. Excessive alcohol or drug use. Inability to concentrate.  10 Ways to Cope with Chronic Stress It's key to recognize stressful situations as they occur because it allows you to focus on managing how you react. We all need to know when to close our eyes and take a deep breath when we feel tension rising. Use these tips to prevent or reduce chronic stress. 1. Rebalance Work and Home All work and no play? If you're spending too much time at the office, intentionally put more dates in your calendar to enjoy time for fun, either alone or with others. 2. Get Regular Exercise Moving your body on a regular basis balances the nervous system and increases blood circulation, helping to flush out stress hormones. Even a daily 20-minute walk makes a difference. Any kind of exercise can lower stress and improve your mood ? just pick activities that you enjoy and make it a regular habit. 3. Eat Well and Limit Alcohol and Stimulants Alcohol, nicotine and caffeine may temporarily relieve stress but have negative health impacts and can make stress worse in the long run. Well-nourished bodies cope better, so start with a good breakfast, add more organic fruits and vegetables for a well-balanced diet, avoid processed foods and sugar, try herbal tea and drink more water. 4. Connect with Supportive People Talking face to face with another person releases hormones that reduce stress. Lean on those good listeners in your life. 5. Carve Out Hobby Time Do you enjoy gardening, reading, listening to music or some other creative pursuit? Engage in activities that bring you pleasure and joy; research shows that reduces stress by almost half and lowers your heart rate, too. 6. Practice Meditation, Stress  Reduction or Yoga Relaxation techniques activate a state of restfulness that counterbalances your body's fight-or-flight hormones. Even if this also means a 10-minute break in a long day: listen to music, read, go for a walk in nature, do a hobby, take a bath or spend time with a friend. Also consider doing a mindfulness exercise or try a daily deep breathing or imagery practice. Deep Breathing Slow, calm and deep breathing can help you relax. Try these steps to focus on your breathing and repeat as needed. Find a comfortable position and close your eyes. Exhale and drop your shoulders. Breathe in through your nose; fill your lungs and then your belly. Think of relaxing your body, quieting your mind and becoming calm and peaceful. Breathe out slowly through your nose, relaxing your belly. Think of releasing tension, pain, worries or distress. Repeat steps three and four until you feel relaxed. Imagery This involves using your mind to excite the senses -- sound, vision, smell, taste and feeling. This may help ease your stress. Begin by getting comfortable and then do some slow breathing. Imagine a place you love being at. It could be somewhere from your childhood, somewhere you vacationed or  just a place in your imagination. Feel how it is to be in the place you're imagining. Pay attention to the sounds, air, colors, and who is there with you. This is a place where you feel cared for and loved. All is well. You are safe. Take in all the smells, sounds, tastes and feelings. As you do, feel your body being nourished and healed. Feel the calm that surrounds you. Breathe in all the good. Breathe out any discomfort or tension. 7. Sleep Enough If you get less than seven to eight hours of sleep, your body won't tolerate stress as well as it could. If stress keeps you up at night, address the cause, and add extra meditation into your day to make up for the lost z's. Try to get seven to nine hours of sleep  each night. Make a regular bedtime schedule. Keep your room dark and cool. Try to avoid computers, TV, cell phones and tablets before bed. 8. Bond with Connections You Enjoy Go out for a coffee with a friend, chat with a neighbor, call a family member, visit with a clergy member, or even hang out with your pet. Clinical studies show that spending even a short time with a companion animal can cut anxiety levels almost in half. 9. Take a Vacation Getting away from it all can reset your stress tolerance by increasing your mental and emotional outlook, which makes you a happier, more productive person upon return. Leave your cellphone and laptop at home! 10. See a Counselor, Coach or Therapist If negative thoughts overwhelm your ability to make positive changes, it's time to seek professional help. Make an appointment today--your health and life are worth it."     24- Hour Availability:    [facility]  [address] [place], [place] [phone] Crisis [phone]   [facility] [phone]   [place] [facility]  [phone]    [facility]  [phone] (after hours)   Therapeutic Alternative/Mobile Crisis   [phone]   [place] National Suicide Hotline  [phone] Len Childs) [place] [address]   Call 911 or go to emergency room   [facility]  [phone]);  [place] and [employer]  [phone]); [place], [place], [place], [place], [place], [place], [place]         Follow up goal      Follow up:  Patient agrees to Care Plan and Follow-up.  Plan: The Managed Medicaid care management team will reach out to the patient again over the next 30 days.  Dickie La, [name], MSW, [place] [facility] [place] Managed Medicaid LCSW [facility]  [facility] [email or web address] Phone: [phone]

## 2023-02-03 NOTE — Patient Instructions (Signed)
Visit Information  Chris [name] was given information about Medicaid Managed Care team care coordination services as a part of their Healthy [facility] Medicaid benefit. Chris [name] verbally consented to engagement with the [facility] Managed Care team.   If you are experiencing a medical emergency, please call 911 or report to your local emergency department or urgent care.   If you have a non-emergency medical problem during routine business hours, please contact your provider's office and ask to speak with a nurse.   For questions related to your Healthy [facility] health plan, please call: [phone] or visit the homepage here: [email or web address]  If you would like to schedule transportation through your Healthy [facility] plan, please call the following number at least 2 days in advance of your appointment: [phone]  For information about your ride after you set it up, call Ride Assist at [phone]. Use this number to activate a Will Call pickup, or if your transportation is late for a scheduled pickup. Use this number, too, if you need to make a change or cancel a previously scheduled reservation.  If you need transportation services right away, call [phone]. The after-hours call center is staffed 24 hours to handle ride assistance and urgent reservation requests (including discharges) 365 days a year. Urgent trips include sick visits, hospital discharge requests and life-sustaining treatment.  Call the [facility] Line at [phone], at any time, 24 hours a day, 7 days a week. If you are in danger or need immediate medical attention call 911.  If you would like help to quit smoking, call 1-800-QUIT-NOW (418-312-0365) OR Espaol: 1-855-Djelo-Ya (5-643-329-5188) o para ms informacin haga clic aqu or Text READY to 416-606 to register via text   Following is a copy of your plan of care:  Care Plan : General Social Work  (Adult)  Updates made by Gustavus Bryant, LCSW since 02/03/2023 12:00 AM     Problem: Depression Identification (Depression)      Long-Range Goal: To increase my self care and gain mental health support   Start Date: 07/08/2022  Priority: High  Note:   Timeframe:  Long-Range Goal Priority:  High Start Date:   07/08/22                    Expected End Date:ongoing    Follow Up Date- 03/04/23 at 9 am   - begin personal counseling - call and visit an old friend - check out volunteer opportunities - join a support group - laugh; watch a funny movie or comedian - learn and use visualization or guided imagery - perform a random act of kindness - practice relaxation or meditation daily - start or continue a personal journal - talk about feelings with a friend, family or spiritual advisor - practice positive thinking and self-talk    Why is this important?   When you are stressed, down or upset, your body reacts too.  For example, your blood pressure may get higher; you may have a headache or stomachache.  When your emotions get the best of you, your body's ability to fight off cold and flu gets weak.  These steps will help you manage your emotions.   Current Barriers:  Chronic Mental Health needs related to depression and need for housing Limited social support, Housing barriers, and Mental Health Concerns  Social Isolation ADL IADL limitations Suicidal Ideation/Homicidal Ideation: No  Clinical Social Work Goal(s):  Over the next 120 days, patient will work with [employer] monthly  by telephone or in person to reduce or manage symptoms related to depression and relapse prevention patient will work with [name] to address needs related to financial, food and transportation support. Patient wishes to offset medical and housing expenses with financial support as housing resources are very limited at this time  Patient Self Care Activities:  Ability for insight Agreement to start mental health  treatment and to the self-care journey   Patient Coping Strengths:  Self Advocate Able to Communicate Effectively  Patient Self Care Deficits:  Lacks social connections  The following coping skill education was provided for stress relief and mental health management: "When your car dies or a deadline looms, how do you respond? Long-term, low-grade or acute stress takes a serious toll on your body and mind, so don't ignore feelings of constant tension. Stress is a natural part of life. However, too much stress can harm our health, especially if it continues every day. This is chronic stress and can put you at risk for heart problems like heart disease and depression. Understand what's happening inside your body and learn simple coping skills to combat the negative impacts of everyday stressors.  Types of Stress There are two types of stress: Emotional - types of emotional stress are relationship problems, pressure at work, financial worries, experiencing discrimination or having a major life change. Physical - Examples of physical stress include being sick having pain, not sleeping well, recovery from an injury or having an alcohol and drug use disorder. Fight or Flight Sudden or ongoing stress activates your nervous system and floods your bloodstream with adrenaline and cortisol, two hormones that raise blood pressure, increase heart rate and spike blood sugar. These changes pitch your body into a fight or flight response. That enabled our ancestors to outrun saber-toothed tigers, and it's helpful today for situations like dodging a car accident. But most modern chronic stressors, such as finances or a challenging relationship, keep your body in that heightened state, which hurts your health. Effects of Too Much Stress If constantly under stress, most of [place] will eventually start to function less well.  Multiple studies link chronic stress to a higher risk of heart disease, stroke, depression,  weight gain, memory loss and even premature death, so it's important to recognize the warning signals. Talk to your doctor about ways to manage stress if you're experiencing any of these symptoms: Prolonged periods of poor sleep. Regular, severe headaches. Unexplained weight loss or gain. Feelings of isolation, withdrawal or worthlessness. Constant anger and irritability. Loss of interest in activities. Constant worrying or obsessive thinking. Excessive alcohol or drug use. Inability to concentrate.  10 Ways to Cope with Chronic Stress It's key to recognize stressful situations as they occur because it allows you to focus on managing how you react. We all need to know when to close our eyes and take a deep breath when we feel tension rising. Use these tips to prevent or reduce chronic stress. 1. Rebalance Work and Home All work and no play? If you're spending too much time at the office, intentionally put more dates in your calendar to enjoy time for fun, either alone or with others. 2. Get Regular Exercise Moving your body on a regular basis balances the nervous system and increases blood circulation, helping to flush out stress hormones. Even a daily 20-minute walk makes a difference. Any kind of exercise can lower stress and improve your mood ? just pick activities that you enjoy and make it a regular habit.  3. Eat Well and Limit Alcohol and Stimulants Alcohol, nicotine and caffeine may temporarily relieve stress but have negative health impacts and can make stress worse in the long run. Well-nourished bodies cope better, so start with a good breakfast, add more organic fruits and vegetables for a well-balanced diet, avoid processed foods and sugar, try herbal tea and drink more water. 4. Connect with Supportive People Talking face to face with another person releases hormones that reduce stress. Lean on those good listeners in your life. 5. Carve Out Hobby Time Do you enjoy gardening,  reading, listening to music or some other creative pursuit? Engage in activities that bring you pleasure and joy; research shows that reduces stress by almost half and lowers your heart rate, too. 6. Practice Meditation, Stress Reduction or Yoga Relaxation techniques activate a state of restfulness that counterbalances your body's fight-or-flight hormones. Even if this also means a 10-minute break in a long day: listen to music, read, go for a walk in nature, do a hobby, take a bath or spend time with a friend. Also consider doing a mindfulness exercise or try a daily deep breathing or imagery practice. Deep Breathing Slow, calm and deep breathing can help you relax. Try these steps to focus on your breathing and repeat as needed. Find a comfortable position and close your eyes. Exhale and drop your shoulders. Breathe in through your nose; fill your lungs and then your belly. Think of relaxing your body, quieting your mind and becoming calm and peaceful. Breathe out slowly through your nose, relaxing your belly. Think of releasing tension, pain, worries or distress. Repeat steps three and four until you feel relaxed. Imagery This involves using your mind to excite the senses -- sound, vision, smell, taste and feeling. This may help ease your stress. Begin by getting comfortable and then do some slow breathing. Imagine a place you love being at. It could be somewhere from your childhood, somewhere you vacationed or just a place in your imagination. Feel how it is to be in the place you're imagining. Pay attention to the sounds, air, colors, and who is there with you. This is a place where you feel cared for and loved. All is well. You are safe. Take in all the smells, sounds, tastes and feelings. As you do, feel your body being nourished and healed. Feel the calm that surrounds you. Breathe in all the good. Breathe out any discomfort or tension. 7. Sleep Enough If you get less than seven to eight  hours of sleep, your body won't tolerate stress as well as it could. If stress keeps you up at night, address the cause, and add extra meditation into your day to make up for the lost z's. Try to get seven to nine hours of sleep each night. Make a regular bedtime schedule. Keep your room dark and cool. Try to avoid computers, TV, cell phones and tablets before bed. 8. Bond with Connections You Enjoy Go out for a coffee with a friend, chat with a neighbor, call a family member, visit with a clergy member, or even hang out with your pet. Clinical studies show that spending even a short time with a companion animal can cut anxiety levels almost in half. 9. Take a Vacation Getting away from it all can reset your stress tolerance by increasing your mental and emotional outlook, which makes you a happier, more productive person upon return. Leave your cellphone and laptop at home! 10. See a Veterinary surgeon, Educational psychologist  If negative thoughts overwhelm your ability to make positive changes, it's time to seek professional help. Make an appointment today--your health and life are worth it."     24- Hour Availability:    [facility]  [address] [place], [place] [phone] Crisis [phone]   [facility] [phone]   [place] [facility]  [phone]    [facility]  [phone] (after hours)   Therapeutic Alternative/Mobile Crisis   [phone]   [place] National Suicide Hotline  [phone] [address] [place]) [place] 564   Call 911 or go to emergency room   [facility]  [phone]);  [place] and [employer]  [phone]); [place], [place], [place], [place], [place], [place], [place]         Follow up goal  Dickie La, [name], MSW, [employer] Managed Medicaid LCSW [facility]  [email or web address] Phone: [phone]

## 2023-02-07 NOTE — Progress Notes (Signed)
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  This is a telemedicine visit. Patient identity was confirmed with 2 unique identifiers.   Personal Medical Information Chris [name] (III.1 on pedigree) is a pleasant 62 y.o. African American gentleman with a past medical history of HFpEF and CKD, stage 3. 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  He reports having dyspnea, fatigue, and occasional chest discomfort, heart palpitations, dizziness and syncope, the latter two being more pronounced for the last 3-4 years.  Family history  Relation to Proband Pedigree # Current age Heart condition/age of onset Notes  Daughter IV.1 38 None Echo/EKG to be done  Grandsons, 2 V.1, V.2 89, 17 None   Son IV.2 27 None Echo/EKG to be done No kids  Brothers, 2 III.2 III.4 III.2-Deceased 52 None III.2- Died @ 32 from HIV III.4- Echo/EKG to be done  Sisters, 2 III.3, III.5 ?, 50 None Echo/EKG to be done  Nieces, nephews IV.3-IV.7 ? None         Father II.4 Deceased CHF @ ? Died  of HF @ 29  Paternal aunts, uncles II.1, II.2 68s-80s None   Paternal grandfather 1.1 Deceased CHF Died @ suddenly @ 50s  Paternal grandmother I.2 Deceased ? Died @ ? of ?        Mother II.5 Deceased None Died @ 69 -diabetes complications   Genetic Consult I reviewed the different genetic cardiomyopathies that are considered as non-ischemic cardiomyopathy, namely ARVC (DCM, HCM and LVNC. I discussed the genetics of HCM, DCM, LVNC and ARVC, namely inheritance, incomplete penetrance, variable expression and digenic/compound mutations that can be seen in some patients.    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  This note also includes the re-interpretation of his genetic test result. Variants were re-interpreted to verify the test report interpretation and to compile the information in light of the patient's clinical presentation and family history.   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 Several clinical labs have seen this variant in their testing sample cohort but clinical information is lacking. They have classified it as a variant of unknown clinical significance. This variant occurs at a very low allele frequency, lower than that expected for a pathogenic variant (gnomAD FAF: [id number]). However, computational algorithms do not predict a deleterious effect on gene or protein. Segregation with disease would help classify this variant. However, there are no affected relatives that can be tested to demonstrate this. It is likely that this is a rare benign variant. 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  Genetic testing his children and siblings for this variant of unknown significance is unwarranted but they should undergo regular cardiac surveillance for HCM.  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 Like the MYBPC3 variant, this PKP2 variant is  also not reported in ARVC patients, has a very low allele frequency with computational algorithms demonstrating no deleterious effect on gene or protein. This variant is also a variant of unknown  clinical significance.  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 As there are no affected family members to determine if this variant segregates with disease, genetic testing his first-degree relatives is unwarranted at this time.  In addition, patient should be aware of the protections afforded by the Genetic Information Non-Discrimination Act (GINA). GINA protects a patient from losing employment or health insurance based on their genotype. However, these protections do not cover life insurance and disability.   Informed them that as this variant is classified as a VUS, his children and siblings should undergo regular echocardiograms and EKGs. Explained to patient that frequency of clinical screening is typically determined by age, with those in their teens undergoing screening every year and those over the age of 70 getting screened every 3-5 years. Patient verbalized understanding of this.       Sidney Ace, Ph.D, [facility] Clinical Molecular Geneticist

## 2023-02-08 ENCOUNTER — Ambulatory Visit: Payer: Medicaid Other | Attending: Cardiology | Admitting: Cardiology

## 2023-02-08 ENCOUNTER — Encounter: Payer: Self-pay | Admitting: Cardiology

## 2023-02-08 VITALS — BP 130/88 | HR 66 | Ht 72.0 in | Wt 272.0 lb

## 2023-02-08 DIAGNOSIS — I422 Other hypertrophic cardiomyopathy: Secondary | ICD-10-CM

## 2023-02-08 DIAGNOSIS — I2510 Atherosclerotic heart disease of native coronary artery without angina pectoris: Secondary | ICD-10-CM

## 2023-02-08 DIAGNOSIS — F17200 Nicotine dependence, unspecified, uncomplicated: Secondary | ICD-10-CM | POA: Diagnosis not present

## 2023-02-08 DIAGNOSIS — I10 Essential (primary) hypertension: Secondary | ICD-10-CM | POA: Diagnosis not present

## 2023-02-08 DIAGNOSIS — I428 Other cardiomyopathies: Secondary | ICD-10-CM | POA: Diagnosis not present

## 2023-02-08 DIAGNOSIS — I4892 Unspecified atrial flutter: Secondary | ICD-10-CM | POA: Diagnosis not present

## 2023-02-08 NOTE — Patient Instructions (Signed)
Medication Instructions:   Your physician recommends that you continue on your current medications as directed. Please refer to the Current Medication list given to you today.  *If you need a refill on your cardiac medications before your next appointment, please call your pharmacy*   Lab Work:  None Ordered  If you have labs (blood work) drawn today and your tests are completely normal, you will receive your results only by: [place] (if you have MyChart) OR A paper copy in the mail If you have any lab test that is abnormal or we need to change your treatment, we will call you to review the results.   Testing/Procedures:  None Ordered    Follow-Up: At [facility], you and your health needs are our priority.  As part of our continuing mission to provide you with exceptional heart care, we have created designated Provider Care Teams.  These Care Teams include your primary Cardiologist (physician) and Advanced Practice Providers (APPs -  Physician Assistants and Nurse Practitioners) who all work together to provide you with the care you need, when you need it.  We recommend signing up for the patient portal called "MyChart".  Sign up information is provided on this After Visit Summary.  MyChart is used to connect with patients for Virtual Visits (Telemedicine).  Patients are able to view lab/test results, encounter notes, upcoming appointments, etc.  Non-urgent messages can be sent to your provider as well.   To learn more about what you can do with MyChart, go to [email or web address].    Your next appointment:   6 month(s)  Provider:   [name], MD

## 2023-02-08 NOTE — Progress Notes (Signed)
Cardiology Office Note:    Date:  02/08/2023   ID:  Chris, [name] Apr 29, 1961, MRN [id number]  PCP:  [name], MD  [facility] Cardiologist:   [facility] Electrophysiologist:  None   Referring MD: [name], MD   Chief Complaint  Patient presents with   Follow-up    Patient c.o SOB. Meds reviewed verbally with patient.     History of Present Illness:    Chris [name] is a 62 y.o. male with a hx of hypertension, nonobstructive CAD (65% RCA- LHC 6/24), HFrEF (initial EF 25%, normalized to 50%) atrial flutter s/p DCCV 06/2020, COPD, current smoker x40+ years, former cocaine use x25+ years, HCM, CKD who presents for follow-up.    Doing great, denies chest pain or leg edema.  States having shortness of breath, still smokes.  Working on quitting.  Compliant with medications as prescribed.  Tolerating Coreg, BiDil as prescribed.  No bleeding issues with Eliquis.  Prior notes Right heart cath 11/2463% proximal RCA. CMR asymmetric septal hypertrophy consistent with HCM. Lexiscan Myoview 07/2021 fixed inferior inferolateral defect, no significant coronary calcifications, no significant ischemia. Suddenly admitted for shortness of breath, found to be in atrial flutter with RVR.  Family history of congestive heart failure, unsure if this is HCM. Underwent a TEE guided DC cardioversion 06/2020 .  Echocardiogram obtained 06/2020 while in the hospital showed EF of 25%.  left heart cath 2005 at [facility] years ago with no obstructive disease.  Past Medical History:  Diagnosis Date   Arrhythmia    atrial flutter   CHF (congestive heart failure) (HCC)    Chronic kidney disease    COPD (chronic obstructive pulmonary disease) (HCC)    Coronary artery disease    Depression    Diabetes mellitus without complication (HCC)    GERD (gastroesophageal reflux disease)    Gout    Hypertension    Influenza A with respiratory manifestations    Mental disorder     Past  Surgical History:  Procedure Laterality Date   ANKLE SURGERY     CARDIAC CATHETERIZATION     CARDIOVERSION N/A 07/08/2020   Procedure: CARDIOVERSION;  Surgeon: Lamar Blinks, MD;  Location: [facility] ORS;  Service: Cardiovascular;  Laterality: N/A;   COLONOSCOPY WITH PROPOFOL N/A 11/19/2021   Procedure: COLONOSCOPY WITH PROPOFOL;  Surgeon: Jenel Lucks, MD;  Location: [facility];  Service: Gastroenterology;  Laterality: N/A;   HERNIA REPAIR     x2   IR RADIOLOGIST EVAL & MGMT  05/13/2022   IR RADIOLOGIST EVAL & MGMT  10/27/2022   POLYPECTOMY  11/19/2021   Procedure: POLYPECTOMY;  Surgeon: Jenel Lucks, MD;  Location: [facility];  Service: Gastroenterology;;   RADIOLOGY WITH ANESTHESIA N/A 06/23/2022   Procedure: Right renal tumor ablation;  Surgeon: Bennie Dallas, MD;  Location: [facility] OR;  Service: Radiology;  Laterality: N/A;   RIGHT HEART CATH N/A 07/13/2022   Procedure: RIGHT HEART CATH;  Surgeon: Laurey Morale, MD;  Location: [facility] INVASIVE CV LAB;  Service: Cardiovascular;  Laterality: N/A;   RIGHT/LEFT HEART CATH AND CORONARY ANGIOGRAPHY N/A 11/19/2022   Procedure: RIGHT/LEFT HEART CATH AND CORONARY ANGIOGRAPHY;  Surgeon: Laurey Morale, MD;  Location: [facility] INVASIVE CV LAB;  Service: Cardiovascular;  Laterality: N/A;   SHOULDER SURGERY     TEE WITHOUT CARDIOVERSION N/A 07/08/2020   Procedure: TRANSESOPHAGEAL ECHOCARDIOGRAM (TEE);  Surgeon: Lamar Blinks, MD;  Location: [facility] ORS;  Service: Cardiovascular;  Laterality: N/A;  Current Medications: Current Meds  Medication Sig   Accu-Chek Softclix Lancets lancets Use as instructed   acetaminophen (TYLENOL) 500 MG tablet Take 1-2 tablets (500-1,000 mg total) by mouth every 6 (six) hours as needed (pain.).   albuterol (VENTOLIN HFA) 108 (90 Base) MCG/ACT inhaler Inhale 2 puffs into the lungs every 6 (six) hours as needed for wheezing or shortness of breath.   allopurinol (ZYLOPRIM) 100 MG tablet TAKE 2 TABLETS (200 MG TOTAL)  BY MOUTH DAILY.   apixaban (ELIQUIS) 5 MG TABS tablet Take 1 tablet (5 mg total) by mouth 2 (two) times daily.   atorvastatin (LIPITOR) 80 MG tablet Take 1 tablet (80 mg total) by mouth daily.   baclofen (LIORESAL) 10 MG tablet Take 1 tablet (10 mg total) by mouth 3 (three) times daily.   budesonide-formoterol (SYMBICORT) 160-4.5 MCG/ACT inhaler Inhale 2 puffs into the lungs 2 (two) times daily.   carvedilol (COREG) 12.5 MG tablet Take 1 tablet (12.5 mg total) by mouth 2 (two) times daily.   dapagliflozin propanediol (FARXIGA) 10 MG TABS tablet Take 1 tablet (10 mg total) by mouth daily.   hydrOXYzine (ATARAX) 25 MG tablet Take 1 tablet 3 times a day as needed for anxiety. Take 2 tablets at night as need for sleep   isosorbide-hydrALAZINE (BIDIL) 20-37.5 MG tablet Take 1 tablet by mouth 3 (three) times daily.   mirtazapine (REMERON) 15 MG tablet Take 1 tablet (15 mg total) by mouth at bedtime.   potassium chloride (KLOR-CON M) 10 MEQ tablet Take 2 tablets (20 mEq total) by mouth daily.   Tiotropium Bromide Monohydrate (SPIRIVA RESPIMAT) 2.5 MCG/ACT AERS Inhale 2 puffs into the lungs daily.   torsemide 40 MG TABS Take 20 mg daily, alternating with 40 mg daily.   traMADol (ULTRAM) 50 MG tablet Take 1 tablet (50 mg total) by mouth every 6 (six) hours as needed.   [DISCONTINUED] atorvastatin (LIPITOR) 40 MG tablet Take 1 tablet (40 mg total) by mouth daily.     Allergies:   Patient has no known allergies.   Social History   Socioeconomic History   Marital status: Divorced    Spouse name: Not on file   Number of children: 3   Years of education: Not on file   Highest education level: High school graduate  Occupational History   Occupation: disability  Tobacco Use   Smoking status: Every Day    Current packs/day: 1.00    Average packs/day: 1 pack/day for 43.0 years (43.0 ttl pk-yrs)    Types: Cigarettes   Smokeless tobacco: Never   Tobacco comments:        4 cigs daily--05/12/2022   Vaping Use   Vaping status: Never Used  Substance and Sexual Activity   Alcohol use: Yes    Alcohol/week: 4.0 standard drinks of alcohol    Types: 4 Shots of liquor per week   Drug use: Yes    Frequency: 21.0 times per week    Types: Marijuana    Comment: last use Cocaine- 03/28/2021. Still using marijuana, last use 06/22/21   Sexual activity: Not on file  Other Topics Concern   Not on file  Social History Narrative   ** Merged History Encounter **       Social Determinants of Health   Financial Resource Strain: Medium Risk (07/23/2022)   Overall Financial Resource Strain (CARDIA)    Difficulty of Paying Living Expenses: Somewhat hard  Food Insecurity: No Food Insecurity (04/20/2022)   Hunger Vital Sign  Worried About Programme researcher, broadcasting/film/video in the Last Year: Never true    Ran Out of Food in the Last Year: Never true  Transportation Needs: Unmet Transportation Needs (02/03/2023)   PRAPARE - Transportation    Lack of Transportation (Medical): Yes    Lack of Transportation (Non-Medical): Yes  Physical Activity: Insufficiently Active (11/16/2021)   Exercise Vital Sign    Days of Exercise per Week: 5 days    Minutes of Exercise per Session: 20 min  Stress: Stress Concern Present (01/17/2023)   [employer] of Occupational Health - Occupational Stress Questionnaire    Feeling of Stress : To some extent  Social Connections: Moderately Isolated (07/23/2022)   Social Connection and Isolation Panel [NHANES]    Frequency of Communication with Friends and Family: Twice a week    Frequency of Social Gatherings with Friends and Family: Once a week    Attends Religious Services: More than 4 times per year    Active Member of [employer] or Organizations: No    Attends Banker Meetings: Never    Marital Status: Divorced     Family History: The patient's family history includes Diabetes in his mother; HIV in his brother; Healthy in his daughter and son; Heart disease in his  father.  ROS:   Please see the history of present illness.     All other systems reviewed and are negative.  EKGs/Labs/Other Studies Reviewed:    The following studies were reviewed today:   EKG:  EKG not ordered today.   Recent Labs: 12/20/2022: B Natriuretic Peptide 1,107.5; Hemoglobin 15.8; Platelets 171 01/05/2023: ALT 15; BUN 61; Creatinine, Ser 2.73; Potassium 4.7; Sodium 139  Recent Lipid Panel    Component Value Date/Time   CHOL 181 12/22/2022 1251   CHOL 160 11/07/2019 0918   CHOL 157 10/21/2013 0435   TRIG 146 12/22/2022 1251   TRIG 117 10/21/2013 0435   HDL 27 (L) 12/22/2022 1251   HDL 40 11/07/2019 0918   HDL 30 (L) 10/21/2013 0435   CHOLHDL 6.7 12/22/2022 1251   VLDL 29 12/22/2022 1251   VLDL 23 10/21/2013 0435   LDLCALC 125 (H) 12/22/2022 1251   LDLCALC 92 11/07/2019 0918   LDLCALC 104 (H) 10/21/2013 0435     Risk Assessment/Calculations:      Physical Exam:    VS:  BP 130/88 (BP Location: Right Arm, Patient Position: Sitting, Cuff Size: Normal)   Pulse 66   Ht 6' (1.829 m)   Wt 272 lb (123.4 kg)   SpO2 95%   BMI 36.89 kg/m     Wt Readings from Last 3 Encounters:  02/08/23 272 lb (123.4 kg)  01/05/23 250 lb (113.4 kg)  12/22/22 245 lb (111.1 kg)     GEN:  Well nourished, well developed in no acute distress HEENT: Normal NECK: No JVD; No carotid bruits CARDIAC: RRR, no murmurs, rubs, gallop RESPIRATORY: Decreased breath sounds at bases, no wheezing. ABDOMEN: Soft, non-tender, distended MUSCULOSKELETAL:  No edema; No deformity  SKIN: Warm and dry NEUROLOGIC:  Alert and oriented x 3 PSYCHIATRIC:  Normal affect   ASSESSMENT:    1. NICM (nonischemic cardiomyopathy) (HCC)   2. Coronary artery disease involving native heart without angina pectoris, unspecified vessel or lesion type   3. Hypertrophic cardiomyopathy (HCC)   4. Atrial flutter, unspecified type (HCC)   5. Primary hypertension   6. Smoking    PLAN:    In order of problems  listed above:  NICM, normalized  EF of 50% (initial was 25%).  Last EF 5/24 was 45 to 50%.  Left heart cath with no obstructive CAD.  65 proximal RCA stenosis.  Etiology likely nonischemic secondary to cocaine use versus uncontrolled hypertension.  Appears euvolemic, shortness of breath likely from COPD.  Describes NYHA class II-III symptoms.  Continue BiDil 1 tab 3 times daily.  Coreg 12.5 mg twice daily, Farxiga, torsemide 20 mg daily, alternating with 40 mg daily. Nonobstructive CAD, denies chest pain.  Continue Eliquis, Lipitor 80. HCM, severe asymmetric septal hypertrophy consistent with HCM, no LVOT obstruction.  Continue Coreg 12.5 mg twice daily.  Atrial flutter, DCCV 06/2020  Maintaining sinus rhythm.  Coreg, restart Eliquis. Hypertension, BP controlled.  Continue bidil 1 tabs tid. Coreg 6.25mg  bid, torsemide.  current smoker, cessation advised.    Follow-up in 6 months    Medication Adjustments/Labs and Tests Ordered: Current medicines are reviewed at length with the patient today.  Concerns regarding medicines are outlined above.  No orders of the defined types were placed in this encounter.  No orders of the defined types were placed in this encounter.   Patient Instructions  Medication Instructions:   Your physician recommends that you continue on your current medications as directed. Please refer to the Current Medication list given to you today.  *If you need a refill on your cardiac medications before your next appointment, please call your pharmacy*   Lab Work:  None Ordered  If you have labs (blood work) drawn today and your tests are completely normal, you will receive your results only by: MyChart Message (if you have MyChart) OR A paper copy in the mail If you have any lab test that is abnormal or we need to change your treatment, we will call you to review the results.   Testing/Procedures:  None Ordered   Follow-Up: At [facility], you and  your health needs are our priority.  As part of our continuing mission to provide you with exceptional heart care, we have created designated Provider Care Teams.  These Care Teams include your primary Cardiologist (physician) and Advanced Practice Providers (APPs -  Physician Assistants and Nurse Practitioners) who all work together to provide you with the care you need, when you need it.  We recommend signing up for the patient portal called "MyChart".  Sign up information is provided on this After Visit Summary.  MyChart is used to connect with patients for Virtual Visits (Telemedicine).  Patients are able to view lab/test results, encounter notes, upcoming appointments, etc.  Non-urgent messages can be sent to your provider as well.   To learn more about what you can do with MyChart, go to [email or web address].    Your next appointment:   6 month(s)  Provider:   [name], MD       Signed, [name], MD  02/08/2023 10:01 AM    [facility]

## 2023-02-10 ENCOUNTER — Encounter: Payer: Self-pay | Admitting: Cardiovascular Disease

## 2023-02-10 ENCOUNTER — Ambulatory Visit: Payer: Medicaid Other | Attending: Cardiovascular Disease | Admitting: Cardiovascular Disease

## 2023-02-10 VITALS — BP 138/80 | HR 85 | Ht 72.0 in | Wt 272.2 lb

## 2023-02-10 DIAGNOSIS — I4892 Unspecified atrial flutter: Secondary | ICD-10-CM

## 2023-02-10 NOTE — Patient Instructions (Signed)
Medication Instructions:  Your physician recommends that you continue on your current medications as directed. Please refer to the Current Medication list given to you today. *If you need a refill on your cardiac medications before your next appointment, please call your pharmacy*   Follow-Up: At [facility], you and your health needs are our priority.  As part of our continuing mission to provide you with exceptional heart care, we have created designated Provider Care Teams.  These Care Teams include your primary Cardiologist (physician) and Advanced Practice Providers (APPs -  Physician Assistants and Nurse Practitioners) who all work together to provide you with the care you need, when you need it.  We recommend signing up for the patient portal called "MyChart".  Sign up information is provided on this After Visit Summary.  MyChart is used to connect with patients for Virtual Visits (Telemedicine).  Patients are able to view lab/test results, encounter notes, upcoming appointments, etc.  Non-urgent messages can be sent to your provider as well.   To learn more about what you can do with MyChart, go to [email or web address].    Your next appointment:   3 month(s)  Provider:   Augustus Mealor, MD  

## 2023-02-10 NOTE — Progress Notes (Signed)
Electrophysiology Office Note:    Date:  02/10/2023   ID:  Dezhon, Hollenback 1961-03-03, MRN [id number]  PCP:  [name], MD   [facility]iders Cardiologist:  [name], MD Electrophysiologist:  [name] Small, MD     Referring MD: Laurey Morale, MD   History of Present Illness:    KIANI STANDING is a 62 y.o. male with a medical history significant for atrial flutter, possible hypertrophic cardiomyopathy,HFpEF, renal cell carcinoma status post ablation, referred for arrhythmia management.     He was admitted in January 2022 with acute congestive heart failure attributed to new onset atrial flutter.  He was cardioverted.  Ejection fraction was 25%.  Pharmacologic stress test on July 28, 2021 showed a moderate-sized mild partially reversible mid inferior inferolateral and apical inferior defect, possibly with an element of artifact.  EF had recovered by the time of a echocardiogram on January 26, 2022, but asymmetric septal hypertrophy was noted.  ardiac MRI May 26, 2022 showed normal LV size with severe asymmetric septal hypertrophy in the basal septal wall measuring up to 1.8 cm in thickness with mid wall LGE consistent with asymmetric septal hypertrophic cardiomyopathy.  Coronary angiogram November 19, 2022 showed a 65% proximal RCA stenosis.  A ZIO monitor in February 2024 showed 1.5% PVCs with 6 short runs of nonsustained VT --longest was 7 beats.  The patient's grandfather died suddenly at work in his 54's.  There is a questionable history of syncope.  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 PKP2 gene is associated with ARVC and Brugada, but the clinical significance of the mutation found in this patient is uncertain (not reported in literature).    The patient has a history of cocaine use, most recently + December 20, 2022 during an ER visit.      Today, he reports that he is doing well. He has not had any more episodes of passing out.  He notes that he loves lifting weights and does not want to give this up.  EKGs/Labs/Other Studies Reviewed Today:    Echocardiogram:  TTE Nov 02, 2022 LVEF 45 to 50%.  Suspected inferior and lateral wall hypokinesis.  Moderate left ventricular septal hypertrophy without obstruction. Grade 1 diastolic dysfunction.  Mildly dilated left atrium.   Monitors:  14-day ZIO monitor February 2024 - my interpretation Sinus rhythm heart rate 66 220 bpm, average 88 bpm 1.5% PVCs  There were 6 episodes of NSVT over the 14 day monitoring period, longest 7 beats, the fastest was 188 bpm There were 3 patient triggered events correlating with sinus rhythm and, on 1 occasion, a PVC.  No fibrillation or flutter detected  Stress testing:   Advanced imaging:  Cardiac MRI May 26, 2022 EF could not be calculated due to poor quality study but appears mildly reduced visually.  There is asymmetric septal hypertrophy with a maximal septal wall thickness of 1.8 cm.  Mid wall LGE is present in the basal septum.  No LV outflow tract obstruction noted.  Findings consistent with HCM, asymmetric septal variant  Cardiac catherization  Coronary angiogram November 19, 2022 65% proximal RCA stenosis.  Low cardiac output.  EKG:         Physical Exam:    VS:  BP 138/80   Pulse  85   Ht 6' (1.829 m)   Wt 272 lb 3.2 oz (123.5 kg)   SpO2 95%   BMI 36.92 kg/m     Wt Readings from Last 3 Encounters:  02/10/23 272 lb 3.2 oz (123.5 kg)  02/08/23 272 lb (123.4 kg)  01/05/23 250 lb (113.4 kg)     GEN: Well nourished, well developed in no acute distress CARDIAC: RRR, no murmurs, rubs, gallops RESPIRATORY:  Normal work of breathing MUSCULOSKELETAL: no edema    ASSESSMENT & PLAN:    Risk assessment for sudden  cardiac death in setting of hypertrophic cardiomyopathy There is a history of severe cardiomyopathy attributable to atrial flutter with RVR; EF recovered but remains slightly < 50% Some LGE was present on cardiac MRI The patient's grandfather died suddenly of an unknown cause;  Cardiac MRI showed septal hypertrophy with a maximum thickness of 18 mm NSVT was present on monitor but was low-risk (short duration, < 3 events/48 hours, relatively low rates) He has had non-arrhythmogenic syncope; conveniently, at the time he was wearing a monitor that did not show any arrhythmia.  He has genetic abnormalities of unknown significance The HCM Risk-SCD calculator indicates a 3.49% risk of SCD over 5 years; if we do include NSVT (his NSVT was low risk) and family history of SCD (which doesn't meet strict criteria because it occurred in a second degree relative), his risk is estimated at 5.6% over 5 years. At the lower end of this risk spectrum, ICD is not indicated, at the higher end, ICD is reasonable Doyce Loose).  He is 62 years old, which confers a good prognosis despite the presence of risk factors (Circ Feb 2013)  We had a long discussion about his risk of sudden cardiac death, how some of his indicators are a little in a gray zone, but he does have a real though low risk of sudden cardiac death.  We also discussed the logistics and risks of ICD placement including damage to an organ, prolonged hospital stay, infection, bleeding, device malfunction, and death.  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  He would like to consider his options and follow-up.  History of atrial flutter Single episode, no symptoms of recurrence Hypertrophic cardiomyopathy conveys high risk of stroke in patients with HCM, and  anticoagulation would need to be started if he is diagnosed with any form of chronic flutter or fibrillation  NSVT Fairly common in HCM The NSVT captured on his monitor was relatively slow, infrequent, and brief conveying relatively low risk  65 minutes spent on this visit.  Communication sent to Dr. Shirlee Latch  Signed, [name] Small, MD  02/10/2023 2:42 PM    [facility]

## 2023-02-15 ENCOUNTER — Other Ambulatory Visit: Payer: Medicaid Other

## 2023-02-15 NOTE — Patient Outreach (Addendum)
Medicaid Managed Care Social Work Note  02/15/2023 Name:  Chris [name] MRN:  [id number] DOB:  19-Jul-1960  Chris [name] is an 62 y.o. year old male who is a primary patient of [name], MD.  The Medicaid Managed Care Coordination team was consulted for assistance with:  Transportation Needs  Community Resources   Chris [name] was given information about Medicaid Managed Care Coordination team services today. [name] Patient agreed to services and verbal consent obtained.  Engaged with patient  for by telephone forfollow up visit in response to referral for case management and/or care coordination services.   Assessments/Interventions:  Review of past medical history, allergies, medications, health status, including review of consultants reports, laboratory and other test data, was performed as part of comprehensive evaluation and provision of chronic care management services.  SDOH: (Social Determinant of Health) assessments and interventions performed: SDOH Interventions    Flowsheet Row Patient Outreach Telephone from 01/17/2023 in [place] POPULATION [facility] DEPARTMENT Telephone from 12/23/2022 in [place] POPULATION HEALTH DEPARTMENT Patient Outreach Telephone from 12/17/2022 in [place] POPULATION HEALTH DEPARTMENT Patient Outreach Telephone from 11/10/2022 in [place] POPULATION HEALTH DEPARTMENT Patient Outreach Telephone from 10/25/2022 in [place] POPULATION HEALTH DEPARTMENT Patient Outreach Telephone from 10/20/2022 in [place] POPULATION HEALTH DEPARTMENT  SDOH Interventions        Transportation Interventions -- Payor Benefit -- -- -- Payor Benefit  Stress Interventions Offered [employer], Provide Counseling -- Offered [employer], Provide Counseling Offered [employer], Provide Counseling  [I am feeling better since I seen my doctor] Provide Counseling, Offered [employer] --       [name] completed a telephone outreach with patient, he states [employer] transportation come late or not at all causing him to miss his appointments. [name] will send patient some alternate transportation resources. Patient states he would also like utility resources and resources for fixing the roof. Patient states his sister called the resources [name] provided before and they stated they could not help. [name] will mail patient resources for transportation and utilities, and research other assistance with home repairs and send. [name] was unable to locate any additional home repair services. Advanced Directives Status:  Not addressed in this encounter.  Care Plan                 No Known Allergies  Medications Reviewed Today   Medications were not reviewed in this encounter     Patient Active Problem List   Diagnosis Date Noted   Post traumatic stress disorder 09/23/2022   MDD (major depressive disorder), recurrent episode, moderate (HCC) 08/26/2022   Stage 3b chronic kidney disease (HCC) 08/09/2022   Hypertrophic cardiomyopathy (HCC) 08/09/2022   History of renal cell cancer 08/09/2022   Chronic heart failure with preserved ejection fraction (HFpEF) (HCC) 07/13/2022   Agitation 04/23/2022   RSV (respiratory syncytial virus pneumonia) 04/22/2022   Obesity (BMI 30-39.9) 04/21/2022   COPD exacerbation (HCC) 04/19/2022   Dyslipidemia 04/19/2022   Gout 04/19/2022   Right kidney mass 05/14/2021   CHF exacerbation (HCC) 04/14/2021   Chest pain 04/14/2021   Syncope 04/14/2021   Left-sided weakness 10/28/2020   Typical atrial flutter (HCC)    CHF (congestive heart failure) (HCC) 07/04/2020   Acute exacerbation of CHF (congestive heart failure) (HCC) 06/16/2020   Influenza vaccine refused 05/06/2020   Acute decompensated heart failure (HCC) 05/04/2020   Illiteracy 05/04/2020   Type 2 diabetes mellitus with stage  3 chronic kidney disease (HCC) 12/25/2019   Elevated troponin I level 10/26/2019    History of gout 02/01/2019   Seasonal allergic rhinitis due to pollen 02/01/2019   Nicotine use disorder 11/30/2018   Microscopic hematuria 11/30/2018   Depression 11/30/2018   Difficulty controlling anger 11/30/2018   COPD (chronic obstructive pulmonary disease) (HCC)    CKD (chronic kidney disease) stage 3, GFR 30-59 ml/min (HCC) 08/10/2018   Recurrent epistaxis 04/21/2018   Mixed hyperlipidemia 07/28/2017   Essential hypertension 07/28/2017   Chronic systolic heart failure (HCC) 10/25/2014   Cocaine abuse (HCC) 02/20/2013   Cannabis abuse 02/20/2013   Back pain, chronic 02/20/2013    Conditions to be addressed/monitored per PCP order:   Transportation, utility, and home repair  Care Plan : General Social Work (Adult)  Updates made by Shaune Leeks since 02/15/2023 12:00 AM     Problem: Depression Identification (Depression)      Long-Range Goal: To increase my self care and gain mental health support   Start Date: 07/08/2022  Priority: High  Note:   Timeframe:  Long-Range Goal Priority:  High Start Date:   07/08/22                    Expected End Date:ongoing    Follow Up Date- 03/04/23 at 9 am   - begin personal counseling - call and visit an old friend - check out volunteer opportunities - join a support group - laugh; watch a funny movie or comedian - learn and use visualization or guided imagery - perform a random act of kindness - practice relaxation or meditation daily - start or continue a personal journal - talk about feelings with a friend, family or spiritual advisor - practice positive thinking and self-talk    Why is this important?   When you are stressed, down or upset, your body reacts too.  For example, your blood pressure may get higher; you may have a headache or stomachache.  When your emotions get the best of you, your body's ability to fight off cold and flu gets weak.  These steps will help you manage your emotions.   Current Barriers:   Chronic Mental Health needs related to depression and need for housing Limited social support, Housing barriers, and Mental Health Concerns  Social Isolation ADL IADL limitations Suicidal Ideation/Homicidal Ideation: No  Clinical Social Work Goal(s):  Over the next 120 days, patient will work with LCSW monthly by telephone or in person to reduce or manage symptoms related to depression and relapse prevention patient will work with [name] to address needs related to financial, food and transportation support. Patient wishes to offset medical and housing expenses with financial support as housing resources are very limited at this time  Interventions: Patient interviewed and appropriate assessments performed: brief mental health assessment Discussed plans with patient for ongoing care management follow up and provided patient with direct contact information for care management team Assisted patient/caregiver with obtaining information about health plan benefits Patient was educated on the Advance Directives process. Patient was encouraged to complete document with PCP.  Advance Directive education was provided to patient again on 07/08/22 and 08/19/22 Encouraged patient to consider a mental health provider for long term follow up and therapy/counseling and patient is now agreeable. Referral made to [facility] for both counseling and psychiatry.  Patient reports recent agitation due to his inability to meet his daily mental and physical needs. [facility] LCSW provided emotional support and reflective listening.  Solution-Focused Strategies, Mindfulness or Relaxation Training, Active listening / Reflection utilized , Emotional Supportive Provided, and Verbalization of feelings encouraged  Patient declines any current substance use. Patient has a history of cocaine abuse. Relapse prevention education provided.  Patient reports that he has developed social anxiety. He admits that he gets irritable at times and will  snap at his loved ones and then will experience guilt afterwards. Educated patient on coping methods to implement into his daily life to combat anxiety symptoms and stress. Patient denied any current suicidal or homicidal ideations. Encouraged patient to implement deep breathing and grounding exercises into his daily routine due to ongoing anxiety and SOB.  Patient reports that he will start implementing appropriate self-care habits into his daily routine such as: drinking water, staying active around the house, taking his medications as prescribed, combating negative thoughts or emotions and staying connected with his support network. Positive reinforcement provided.  Past Update- Patient is currently at the ED for back pain. He reports that he is in a lot of pain and it is triggering his mood management. Patient was having difficulty answering [facility] LCSW's questions due to his current state and [facility] LCSW advised him that she will contact him on 12/17/22 to follow up on this ED visit. Brief pain management education and emotional support provided. LCSW update- Patient successfully attended psychiatry appt today at [facility]. He reports really liking his mental health providers and care management team and feels it has helped decrease his loneliness and depression. He reports that his mood management has improved as well. Brief self-care education provided for SOB/anxiety flare ups during the hot summer months. 02/03/23 Update- Pt reports this morning that his Medicaid transportation was "asap transportation services" has now missed 3 of his scheduled medical appointments. He missed a very important therapy appointment and is very upset this morning.[facility] LCSW will make new referral for [facility] [name] involvement. Brief self-care education provided. Patient's mood and sleep routine continues to improve with medication management.  [name] completed a telephone outreach with patient, he states [employer] transportation come late or  not at all causing him to miss his appointments. [name] will send patient some alternate transportation resources. Patient states he would also like utility resources and resources for fixing the roof. Patient states his sister called the resources [name] provided before and they stated they could not help. [name] will mail patient resources for transportation and utilities, and research other assistance with home repairs and send.  Patient Self Care Activities:  Ability for insight Agreement to start mental health treatment and to the self-care journey   Patient Coping Strengths:  Self Advocate Able to Communicate Effectively  Patient Self Care Deficits:  Lacks social connections  The following coping skill education was provided for stress relief and mental health management: "When your car dies or a deadline looms, how do you respond? Long-term, low-grade or acute stress takes a serious toll on your body and mind, so don't ignore feelings of constant tension. Stress is a natural part of life. However, too much stress can harm our health, especially if it continues every day. This is chronic stress and can put you at risk for heart problems like heart disease and depression. Understand what's happening inside your body and learn simple coping skills to combat the negative impacts of everyday stressors.  Types of Stress There are two types of stress: Emotional - types of emotional stress are relationship problems, pressure at work, financial worries, experiencing discrimination or having a  major life change. Physical - Examples of physical stress include being sick having pain, not sleeping well, recovery from an injury or having an alcohol and drug use disorder. Fight or Flight Sudden or ongoing stress activates your nervous system and floods your bloodstream with adrenaline and cortisol, two hormones that raise blood pressure, increase heart rate and spike blood sugar. These changes pitch your body  into a fight or flight response. That enabled our ancestors to outrun saber-toothed tigers, and it's helpful today for situations like dodging a car accident. But most modern chronic stressors, such as finances or a challenging relationship, keep your body in that heightened state, which hurts your health. Effects of Too Much Stress If constantly under stress, most of [place] will eventually start to function less well.  Multiple studies link chronic stress to a higher risk of heart disease, stroke, depression, weight gain, memory loss and even premature death, so it's important to recognize the warning signals. Talk to your doctor about ways to manage stress if you're experiencing any of these symptoms: Prolonged periods of poor sleep. Regular, severe headaches. Unexplained weight loss or gain. Feelings of isolation, withdrawal or worthlessness. Constant anger and irritability. Loss of interest in activities. Constant worrying or obsessive thinking. Excessive alcohol or drug use. Inability to concentrate.  10 Ways to Cope with Chronic Stress It's key to recognize stressful situations as they occur because it allows you to focus on managing how you react. We all need to know when to close our eyes and take a deep breath when we feel tension rising. Use these tips to prevent or reduce chronic stress. 1. Rebalance Work and Home All work and no play? If you're spending too much time at the office, intentionally put more dates in your calendar to enjoy time for fun, either alone or with others. 2. Get Regular Exercise Moving your body on a regular basis balances the nervous system and increases blood circulation, helping to flush out stress hormones. Even a daily 20-minute walk makes a difference. Any kind of exercise can lower stress and improve your mood ? just pick activities that you enjoy and make it a regular habit. 3. Eat Well and Limit Alcohol and Stimulants Alcohol, nicotine and caffeine may  temporarily relieve stress but have negative health impacts and can make stress worse in the long run. Well-nourished bodies cope better, so start with a good breakfast, add more organic fruits and vegetables for a well-balanced diet, avoid processed foods and sugar, try herbal tea and drink more water. 4. Connect with Supportive People Talking face to face with another person releases hormones that reduce stress. Lean on those good listeners in your life. 5. Carve Out Hobby Time Do you enjoy gardening, reading, listening to music or some other creative pursuit? Engage in activities that bring you pleasure and joy; research shows that reduces stress by almost half and lowers your heart rate, too. 6. Practice Meditation, Stress Reduction or Yoga Relaxation techniques activate a state of restfulness that counterbalances your body's fight-or-flight hormones. Even if this also means a 10-minute break in a long day: listen to music, read, go for a walk in nature, do a hobby, take a bath or spend time with a friend. Also consider doing a mindfulness exercise or try a daily deep breathing or imagery practice. Deep Breathing Slow, calm and deep breathing can help you relax. Try these steps to focus on your breathing and repeat as needed. Find a comfortable position and close your  eyes. Exhale and drop your shoulders. Breathe in through your nose; fill your lungs and then your belly. Think of relaxing your body, quieting your mind and becoming calm and peaceful. Breathe out slowly through your nose, relaxing your belly. Think of releasing tension, pain, worries or distress. Repeat steps three and four until you feel relaxed. Imagery This involves using your mind to excite the senses -- sound, vision, smell, taste and feeling. This may help ease your stress. Begin by getting comfortable and then do some slow breathing. Imagine a place you love being at. It could be somewhere from your childhood, somewhere you  vacationed or just a place in your imagination. Feel how it is to be in the place you're imagining. Pay attention to the sounds, air, colors, and who is there with you. This is a place where you feel cared for and loved. All is well. You are safe. Take in all the smells, sounds, tastes and feelings. As you do, feel your body being nourished and healed. Feel the calm that surrounds you. Breathe in all the good. Breathe out any discomfort or tension. 7. Sleep Enough If you get less than seven to eight hours of sleep, your body won't tolerate stress as well as it could. If stress keeps you up at night, address the cause, and add extra meditation into your day to make up for the lost z's. Try to get seven to nine hours of sleep each night. Make a regular bedtime schedule. Keep your room dark and cool. Try to avoid computers, TV, cell phones and tablets before bed. 8. Bond with Connections You Enjoy Go out for a coffee with a friend, chat with a neighbor, call a family member, visit with a clergy member, or even hang out with your pet. Clinical studies show that spending even a short time with a companion animal can cut anxiety levels almost in half. 9. Take a Vacation Getting away from it all can reset your stress tolerance by increasing your mental and emotional outlook, which makes you a happier, more productive person upon return. Leave your cellphone and laptop at home! 10. See a Counselor, Coach or Therapist If negative thoughts overwhelm your ability to make positive changes, it's time to seek professional help. Make an appointment today--your health and life are worth it."     24- Hour Availability:    [facility]  [address] [place], [place] [phone] Crisis [phone]   [facility] [phone]   [place] [facility]  [phone]    [facility]  [phone] (after hours)   Therapeutic  Alternative/Mobile Crisis   [phone]   [place] National Suicide Hotline  [phone] 027-5691 Len Childs) [place] 732   Call 911 or go to emergency room   [facility]  [phone]);  [place] and [employer]  [phone]); [place], [place], [place], [place], [place], [place], [place]         Follow up goal      Follow up:  Patient agrees to Care Plan and Follow-up.  Plan: The Managed Medicaid care management team will reach out to the patient again over the next 30 days.  Date/time of next scheduled Social Work care management/care coordination outreach:  03/17/23  [name], [name], [facility]  Managed [facility] Social Worker [phone]

## 2023-02-15 NOTE — Patient Instructions (Signed)
Visit Information  Chris [name] was given information about Medicaid Managed Care team care coordination services as a part of their [facility] Medicaid benefit. Chris [name] verbally consented to engagement with the [facility] Managed Care team.   If you are experiencing a medical emergency, please call 911 or report to your local emergency department or urgent care.   If you have a non-emergency medical problem during routine business hours, please contact your provider's office and ask to speak with a nurse.   For questions related to your [facility] health plan, please call: [phone] or visit the homepage here: [email or web address]  If you would like to schedule transportation through your Healthy [facility] plan, please call the following number at least 2 days in advance of your appointment: [phone]  For information about your ride after you set it up, call Ride Assist at [phone]. Use this number to activate a Will Call pickup, or if your transportation is late for a scheduled pickup. Use this number, too, if you need to make a change or cancel a previously scheduled reservation.  If you need transportation services right away, call [phone]. The after-hours call center is staffed 24 hours to handle ride assistance and urgent reservation requests (including discharges) 365 days a year. Urgent trips include sick visits, hospital discharge requests and life-sustaining treatment.  Call the [facility] Line at [phone], at any time, 24 hours a day, 7 days a week. If you are in danger or need immediate medical attention call 911.  If you would like help to quit smoking, call 1-800-QUIT-NOW (936-756-4082) OR Espaol: 1-855-Djelo-Ya (4-166-063-0160) o para ms informacin haga clic aqu or Text READY to 109-323 to register via text  Chris [name] - following are the goals we discussed in your visit today:    Goals Addressed   None      Social Worker will follow up on 03/17/23.   Chris [name], Chris [name], MHA [facility] Health  Managed Medicaid Social Worker [phone]   Following is a copy of your plan of care:  Care Plan : General Social Work (Adult)  Updates made by Shaune Leeks since 02/15/2023 12:00 AM     Problem: Depression Identification (Depression)      Long-Range Goal: To increase my self care and gain mental health support   Start Date: 07/08/2022  Priority: High  Note:   Timeframe:  Long-Range Goal Priority:  High Start Date:   07/08/22                    Expected End Date:ongoing    Follow Up Date- 03/04/23 at 9 am   - begin personal counseling - call and visit an old friend - check out volunteer opportunities - join a support group - laugh; watch a funny movie or comedian - learn and use visualization or guided imagery - perform a random act of kindness - practice relaxation or meditation daily - start or continue a personal journal - talk about feelings with a friend, family or spiritual advisor - practice positive thinking and self-talk    Why is this important?   When you are stressed, down or upset, your body reacts too.  For example, your blood pressure may get higher; you may have a headache or stomachache.  When your emotions get the best of you, your body's ability to fight off cold and flu gets weak.  These steps will help you manage your emotions.   Current Barriers:  Chronic Mental Health needs related to depression and need for housing Limited social support, Housing barriers, and Mental Health Concerns  Social Isolation ADL IADL limitations Suicidal Ideation/Homicidal Ideation: No  Clinical Social Work Goal(s):  Over the next 120 days, patient will work with LCSW monthly by telephone or in person to reduce or manage symptoms related to depression and relapse prevention patient will work with [name] to address needs related to financial, food and  transportation support. Patient wishes to offset medical and housing expenses with financial support as housing resources are very limited at this time  Interventions: Patient interviewed and appropriate assessments performed: brief mental health assessment Discussed plans with patient for ongoing care management follow up and provided patient with direct contact information for care management team Assisted patient/caregiver with obtaining information about health plan benefits Patient was educated on the Advance Directives process. Patient was encouraged to complete document with PCP.  Advance Directive education was provided to patient again on 07/08/22 and 08/19/22 Encouraged patient to consider a mental health provider for long term follow up and therapy/counseling and patient is now agreeable. Referral made to [facility] for both counseling and psychiatry.  Patient reports recent agitation due to his inability to meet his daily mental and physical needs. [facility] LCSW provided emotional support and reflective listening.  Solution-Focused Strategies, Mindfulness or Relaxation Training, Active listening / Reflection utilized , Emotional Supportive Provided, and Verbalization of feelings encouraged  Patient declines any current substance use. Patient has a history of cocaine abuse. Relapse prevention education provided.  Patient reports that he has developed social anxiety. He admits that he gets irritable at times and will snap at his loved ones and then will experience guilt afterwards. Educated patient on coping methods to implement into his daily life to combat anxiety symptoms and stress. Patient denied any current suicidal or homicidal ideations. Encouraged patient to implement deep breathing and grounding exercises into his daily routine due to ongoing anxiety and SOB.  Patient reports that he will start implementing appropriate self-care habits into his daily routine such as: drinking water, staying  active around the house, taking his medications as prescribed, combating negative thoughts or emotions and staying connected with his support network. Positive reinforcement provided.  Past Update- Patient is currently at the ED for back pain. He reports that he is in a lot of pain and it is triggering his mood management. Patient was having difficulty answering [facility] LCSW's questions due to his current state and [facility] LCSW advised him that she will contact him on 12/17/22 to follow up on this ED visit. Brief pain management education and emotional support provided. LCSW update- Patient successfully attended psychiatry appt today at [facility]. He reports really liking his mental health providers and care management team and feels it has helped decrease his loneliness and depression. He reports that his mood management has improved as well. Brief self-care education provided for SOB/anxiety flare ups during the hot summer months. 02/03/23 Update- Pt reports this morning that his Medicaid transportation was "asap transportation services" has now missed 3 of his scheduled medical appointments. He missed a very important therapy appointment and is very upset this morning.[facility] LCSW will make new referral for [facility] [name] involvement. Brief self-care education provided. Patient's mood and sleep routine continues to improve with medication management.  [name] completed a telephone outreach with patient, he states [employer] transportation come late or not at all causing him to miss his appointments. [name] will send patient some alternate transportation resources. Patient  states he would also like utility resources and resources for fixing the roof. Patient states his sister called the resources [name] provided before and they stated they could not help. [name] will mail patient resources for transportation and utilities, and research other assistance with home repairs and send.  Patient Self Care Activities:  Ability for insight Agreement to  start mental health treatment and to the self-care journey   Patient Coping Strengths:  Self Advocate Able to Communicate Effectively  Patient Self Care Deficits:  Lacks social connections  The following coping skill education was provided for stress relief and mental health management: "When your car dies or a deadline looms, how do you respond? Long-term, low-grade or acute stress takes a serious toll on your body and mind, so don't ignore feelings of constant tension. Stress is a natural part of life. However, too much stress can harm our health, especially if it continues every day. This is chronic stress and can put you at risk for heart problems like heart disease and depression. Understand what's happening inside your body and learn simple coping skills to combat the negative impacts of everyday stressors.  Types of Stress There are two types of stress: Emotional - types of emotional stress are relationship problems, pressure at work, financial worries, experiencing discrimination or having a major life change. Physical - Examples of physical stress include being sick having pain, not sleeping well, recovery from an injury or having an alcohol and drug use disorder. Fight or Flight Sudden or ongoing stress activates your nervous system and floods your bloodstream with adrenaline and cortisol, two hormones that raise blood pressure, increase heart rate and spike blood sugar. These changes pitch your body into a fight or flight response. That enabled our ancestors to outrun saber-toothed tigers, and it's helpful today for situations like dodging a car accident. But most modern chronic stressors, such as finances or a challenging relationship, keep your body in that heightened state, which hurts your health. Effects of Too Much Stress If constantly under stress, most of [place] will eventually start to function less well.  Multiple studies link chronic stress to a higher risk of heart disease,  stroke, depression, weight gain, memory loss and even premature death, so it's important to recognize the warning signals. Talk to your doctor about ways to manage stress if you're experiencing any of these symptoms: Prolonged periods of poor sleep. Regular, severe headaches. Unexplained weight loss or gain. Feelings of isolation, withdrawal or worthlessness. Constant anger and irritability. Loss of interest in activities. Constant worrying or obsessive thinking. Excessive alcohol or drug use. Inability to concentrate.  10 Ways to Cope with Chronic Stress It's key to recognize stressful situations as they occur because it allows you to focus on managing how you react. We all need to know when to close our eyes and take a deep breath when we feel tension rising. Use these tips to prevent or reduce chronic stress. 1. Rebalance Work and Home All work and no play? If you're spending too much time at the office, intentionally put more dates in your calendar to enjoy time for fun, either alone or with others. 2. Get Regular Exercise Moving your body on a regular basis balances the nervous system and increases blood circulation, helping to flush out stress hormones. Even a daily 20-minute walk makes a difference. Any kind of exercise can lower stress and improve your mood ? just pick activities that you enjoy and make it a regular habit. 3. Eat Well  and Limit Alcohol and Stimulants Alcohol, nicotine and caffeine may temporarily relieve stress but have negative health impacts and can make stress worse in the long run. Well-nourished bodies cope better, so start with a good breakfast, add more organic fruits and vegetables for a well-balanced diet, avoid processed foods and sugar, try herbal tea and drink more water. 4. Connect with Supportive People Talking face to face with another person releases hormones that reduce stress. Lean on those good listeners in your life. 5. Carve Out Hobby Time Do you  enjoy gardening, reading, listening to music or some other creative pursuit? Engage in activities that bring you pleasure and joy; research shows that reduces stress by almost half and lowers your heart rate, too. 6. Practice Meditation, Stress Reduction or Yoga Relaxation techniques activate a state of restfulness that counterbalances your body's fight-or-flight hormones. Even if this also means a 10-minute break in a long day: listen to music, read, go for a walk in nature, do a hobby, take a bath or spend time with a friend. Also consider doing a mindfulness exercise or try a daily deep breathing or imagery practice. Deep Breathing Slow, calm and deep breathing can help you relax. Try these steps to focus on your breathing and repeat as needed. Find a comfortable position and close your eyes. Exhale and drop your shoulders. Breathe in through your nose; fill your lungs and then your belly. Think of relaxing your body, quieting your mind and becoming calm and peaceful. Breathe out slowly through your nose, relaxing your belly. Think of releasing tension, pain, worries or distress. Repeat steps three and four until you feel relaxed. Imagery This involves using your mind to excite the senses -- sound, vision, smell, taste and feeling. This may help ease your stress. Begin by getting comfortable and then do some slow breathing. Imagine a place you love being at. It could be somewhere from your childhood, somewhere you vacationed or just a place in your imagination. Feel how it is to be in the place you're imagining. Pay attention to the sounds, air, colors, and who is there with you. This is a place where you feel cared for and loved. All is well. You are safe. Take in all the smells, sounds, tastes and feelings. As you do, feel your body being nourished and healed. Feel the calm that surrounds you. Breathe in all the good. Breathe out any discomfort or tension. 7. Sleep Enough If you get less than  seven to eight hours of sleep, your body won't tolerate stress as well as it could. If stress keeps you up at night, address the cause, and add extra meditation into your day to make up for the lost z's. Try to get seven to nine hours of sleep each night. Make a regular bedtime schedule. Keep your room dark and cool. Try to avoid computers, TV, cell phones and tablets before bed. 8. Bond with Connections You Enjoy Go out for a coffee with a friend, chat with a neighbor, call a family member, visit with a clergy member, or even hang out with your pet. Clinical studies show that spending even a short time with a companion animal can cut anxiety levels almost in half. 9. Take a Vacation Getting away from it all can reset your stress tolerance by increasing your mental and emotional outlook, which makes you a happier, more productive person upon return. Leave your cellphone and laptop at home! 10. See a Counselor, Coach or Therapist If negative thoughts  overwhelm your ability to make positive changes, it's time to seek professional help. Make an appointment today--your health and life are worth it."     24- Hour Availability:    [facility]  [address] [place], [place] [phone] Crisis [phone]   [facility] [phone]   [place] [facility]  [phone]    [facility]  [phone] (after hours)   Therapeutic Alternative/Mobile Crisis   [phone]   [place] National Suicide Hotline  [phone] Len Childs) [place] 884   Call 911 or go to emergency room   [facility]  [phone]);  [place] and [employer]  [phone]); [place], [place], [place], [place], [place], [place], [place]         Follow up goal

## 2023-02-17 NOTE — Progress Notes (Signed)
This encounter was created in error - please disregard.

## 2023-02-24 ENCOUNTER — Ambulatory Visit
Admission: RE | Admit: 2023-02-24 | Discharge: 2023-02-24 | Disposition: A | Discharge status: Home / Self Care | Payer: Medicaid Other | Source: Ambulatory Visit | Attending: Interventional Radiology | Admitting: Interventional Radiology

## 2023-02-24 DIAGNOSIS — C641 Malignant neoplasm of right kidney, except renal pelvis: Secondary | ICD-10-CM | POA: Diagnosis not present

## 2023-02-24 DIAGNOSIS — C642 Malignant neoplasm of left kidney, except renal pelvis: Secondary | ICD-10-CM

## 2023-02-24 MED ORDER — GADOPICLENOL 0.5 MMOL/ML IV SOLN
10.0000 mL | Freq: Once | INTRAVENOUS | Status: AC | PRN
  Administered 2023-02-24: 10 mL via INTRAVENOUS

## 2023-02-25 ENCOUNTER — Ambulatory Visit (HOSPITAL_BASED_OUTPATIENT_CLINIC_OR_DEPARTMENT_OTHER): Payer: Medicaid Other | Admitting: Cardiology

## 2023-02-25 ENCOUNTER — Encounter: Payer: Self-pay | Admitting: Cardiology

## 2023-02-25 ENCOUNTER — Other Ambulatory Visit
Admission: RE | Admit: 2023-02-25 | Discharge: 2023-02-25 | Disposition: A | Discharge status: Home / Self Care | Payer: Medicaid Other | Source: Ambulatory Visit | Attending: Internal Medicine | Admitting: Internal Medicine

## 2023-02-25 VITALS — BP 130/79 | HR 74 | Wt 270.0 lb

## 2023-02-25 DIAGNOSIS — I5042 Chronic combined systolic (congestive) and diastolic (congestive) heart failure: Secondary | ICD-10-CM | POA: Insufficient documentation

## 2023-02-25 DIAGNOSIS — I509 Heart failure, unspecified: Secondary | ICD-10-CM | POA: Diagnosis present

## 2023-02-25 LAB — COMPREHENSIVE METABOLIC PANEL
ALT: 24 U/L (ref 0–44)
AST: 28 U/L (ref 15–41)
Albumin: 3.7 g/dL (ref 3.5–5.0)
Alkaline Phosphatase: 88 U/L (ref 38–126)
Anion gap: 8 (ref 5–15)
BUN: 41 mg/dL — ABNORMAL HIGH (ref 6–20)
CO2: 25 mmol/L (ref 22–32)
Calcium: 8.8 mg/dL — ABNORMAL LOW (ref 8.9–10.3)
Chloride: 105 mmol/L (ref 98–111)
Creatinine, Ser: 2.15 mg/dL — ABNORMAL HIGH (ref 0.61–1.24)
GFR, Estimated: 34 mL/min — ABNORMAL LOW (ref >60)
Glucose, Bld: 75 mg/dL (ref 70–99)
Potassium: 4.5 mmol/L (ref 3.5–5.1)
Sodium: 138 mmol/L (ref 135–145)
Total Bilirubin: 0.6 mg/dL (ref 0.3–1.2)
Total Protein: 7.2 g/dL (ref 6.5–8.1)

## 2023-02-25 LAB — LIPID PANEL
Cholesterol: 155 mg/dL (ref 0–200)
HDL: 27 mg/dL — ABNORMAL LOW (ref >40)
LDL Cholesterol: 53 mg/dL (ref 0–99)
Total CHOL/HDL Ratio: 5.7 ratio
Triglycerides: 376 mg/dL — ABNORMAL HIGH (ref <150)
VLDL: 75 mg/dL — ABNORMAL HIGH (ref 0–40)

## 2023-02-25 LAB — BRAIN NATRIURETIC PEPTIDE: B Natriuretic Peptide: 156.5 pg/mL — ABNORMAL HIGH (ref 0.0–100.0)

## 2023-02-25 NOTE — Patient Instructions (Signed)
START  TAKING COENZYME- Q- 10 200 MG DAILY. YOU CAN GET THIS OVER THE COUNTER.  Go over to the [facility]ICAL [facility]ALL. Go pass the gift shop and have your blood work completed.  FOLLOW UP IN 3 MONTHS (HIS SCHEDULE ISN'T OPEN YET. WE WILL PLACE YOU ON OUR RECALL LIST AND CALL YOU CLOSER TO THAT TIME ONCE THE SCHEDULE BECOMES AVAILABLE.)  USE THE TREADMILL AND BICYCLE WHEN YOU GO TO THE GYM

## 2023-02-25 NOTE — Progress Notes (Signed)
PCP: [name], MD HF Cardiology: Dr. Shirlee Latch  62 y.o. with history of HFpEF, CKD stage 3, possible hypertrophic cardiomyopathy, renal cell CA s/p ablation, and atrial flutter s/p 1/22 DCCV who was referred to CHF MD clinic by [facility].  Patient was admitted in 1/22 with CHF and found to be in atrial flutter.  Echo showed EF 25% and he was cardioverted to NSR.  He is no longer anticoagulated. Cardiolite in 2/23 showed no ischemia, fixed inferior defect.  Echo in 8/23 showed EF 50% with severe asymmetric septal hypertrophy but no SAM or LVOT gradient.  Cardiac MRI in 12/23 was similar with LV EF 50%, severe asymmetric septal hypertrophy, normal RV, LGE in the basal septum. These studies were concerning for hypertrophic cardiomyopathy.  Patient is an active smoker and used to use cocaine.  He carries history of COPD.  Grandfather had sudden cardiac death at work ("fell out dead").   RHC in 08-02-2022 showed elevated filling pressures, CI 2.25, PVR 3.9 WU.  Torsemide was increased.    Patient was seen in the ER in 2/6 with abdominal pain, nausea/vomiting.  At that time, creatinine was up to 2.54.   Zio monitor done 2/24 for episodes of ?syncope showed no AF/AFL, 1.5 % PVCs, 6 short NSVT runs (longest was 7 beats).   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  PKP2 gene is associated with ARVC and Brugada, but the clinical significance of the mutation found in this patient is uncertain (not reported in literature).  Patient was seen by Dr. Jomarie Longs for genetics counseling, she did not think that these mutations were likely to be malignant.   Echo in 5/24 showed EF 45-50%, moderate asymmetric septal hypertrophy with no LVOT gradient of mitral valve SAM, RV normal, mild-moderate MR, IVC normal.   LHC/RHC was done  in 6/24, showing 65% proximal RCA stenosis, normal filling pressures but low CI at 1.81.   Patient was seen in the ER in 7/24 due to severe low back pain.  UDS was positive for cocaine.  He says this is the first time in years he has used it, due to stress from court case and back pain.   He saw Dr. Nelly Laurence with EP, ICD placement was discussed for HCM, he was a borderline candidate and ended up deciding that he did not want ICD.   He returns today for followup of CHF and suspected HCM.  Still smoking 3-4 cigarettes/day.  Weight is up, he reports eating more.  He is going to the gym every day but lifts weights and does not do any cardio exercise.  He denies dyspnea walking on flat ground or up a flight of stairs.  No chest pain.  No lightheadedness/syncope.  No palpitations.   Labs 02-Aug-2022): BNP 873, K 3.6, creatinine 1.7 Labs (2/24): K 4, creatinine 2.54 Labs (4/24): K 4.2, creatinine 1.75, BNP 280, hgb 14.4 Labs (7/24): K 4.1, creatinine 2.15 => 2.73, BNP 1107, LDL 125  PMH: 1. Renal cell carcinoma on right: s/p ablation 2022/08/02.  2. HF with mid range EF: ?HCM, ?cardiomyopathy related to cocaine in the past.  EF low in the past, echo in 1/22 with EF 22%.   - Cardiolite (2/23): Fixed inferior defect, ?artifact.  - Echo (8/23): EF 50%, severe asymmetric septal hypertrophy, no  SAM/MR, no LVOT gradient, RV normal.  - Cardiac MRI (12/23): LV EF 50%, severe asymmetric septal hypertrophy, normal RV, LGE in the basal septum.  Possible hypertrophic cardiomyopathy.  - RHC (1/24): mean RA 15, PA 50/31 mean 42, mean PCWP 21, CI 2.25, PVR 3.9 WU, PAPi 1.3 - Zio monitor done 2/24 showed no AF/AFL, 1.5 % PVCs, 6 short NSVT runs (longest was 7 beats).  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  PKP2 gene is associated with  ARVC and Brugada, but the clinical significance of the mutation found in this patient is uncertain (not reported in literature).  Seen by Dr. Jomarie Longs, mutations not thought to be clinically significant.  - Echo (5/24): EF 45-50%, moderate asymmetric septal hypertrophy with no LVOT gradient of mitral valve SAM, RV normal, mild-moderate MR, IVC normal. - [facility] (6/24): 65% proximal RCA stenosis; mean RA 5, PA 32/12 mean 24, mean PCWP 7,CI 1.81. 3. Atrial flutter: DCCV 1/22.   4. Type 2 diabetes.  5. HTN 6. CKD stage 3 7. Gout 8. Depression 9. COPD: Active smoker.  10. Prior cocaine abuse.   Social History   Socioeconomic History   Marital status: Divorced    Spouse name: Not on file   Number of children: 3   Years of education: Not on file   Highest education level: High school graduate  Occupational History   Occupation: disability  Tobacco Use   Smoking status: Every Day    Current packs/day: 1.00    Average packs/day: 1 pack/day for 43.0 years (43.0 ttl pk-yrs)    Types: Cigarettes   Smokeless tobacco: Never   Tobacco comments:        4 cigs daily--05/12/2022  Vaping Use   Vaping status: Never Used  Substance and Sexual Activity   Alcohol use: Yes    Alcohol/week: 4.0 standard drinks of alcohol    Types: 4 Shots of liquor per week   Drug use: Yes    Frequency: 21.0 times per week    Types: Marijuana    Comment: last use Cocaine- 03/28/2021. Still using marijuana, last use 06/22/21   Sexual activity: Not on file  Other Topics Concern   Not on file  Social History Narrative   ** Merged History Encounter **       Social Determinants of Health   Financial Resource Strain: Medium Risk (07/23/2022)   Overall Financial Resource Strain (CARDIA)    Difficulty of Paying Living Expenses: Somewhat hard  Food Insecurity: No Food Insecurity (04/20/2022)   Hunger Vital Sign    Worried About Running Out of Food in the Last Year: Never true    Ran Out of Food in the Last Year:  Never true  Transportation Needs: Unmet Transportation Needs (02/03/2023)   PRAPARE - Transportation    Lack of Transportation (Medical): Yes    Lack of Transportation (Non-Medical): Yes  Physical Activity: Insufficiently Active (11/16/2021)   Exercise Vital Sign    Days of Exercise per Week: 5 days    Minutes of Exercise per Session: 20 min  Stress: Stress Concern Present (01/17/2023)   [employer] of Occupational Health - Occupational Stress Questionnaire    Feeling of Stress : To some extent  Social Connections: Moderately Isolated (07/23/2022)   Social Connection and Isolation Panel [NHANES]  Frequency of Communication with Friends and Family: Twice a week    Frequency of Social Gatherings with Friends and Family: Once a week    Attends Religious Services: More than 4 times per year    Active Member of [employer] or Organizations: No    Attends Banker Meetings: Never    Marital Status: Divorced  Catering manager Violence: Not At Risk (04/20/2022)   Humiliation, Afraid, Rape, and Kick questionnaire    Fear of Current or Ex-Partner: No    Emotionally Abused: No    Physically Abused: No    Sexually Abused: No   Family History  Problem Relation Age of Onset   Heart disease Father    Diabetes Mother    HIV Brother    Healthy Son    Healthy Daughter    ROS: All systems reviewed and negative except as per HPI.   Current Outpatient Medications  Medication Sig Dispense Refill   Accu-Chek Softclix Lancets lancets Use as instructed 100 each 12   acetaminophen (TYLENOL) 500 MG tablet Take 1-2 tablets (500-1,000 mg total) by mouth every 6 (six) hours as needed (pain.). 30 tablet 0   albuterol (VENTOLIN HFA) 108 (90 Base) MCG/ACT inhaler Inhale 2 puffs into the lungs every 6 (six) hours as needed for wheezing or shortness of breath. 54 g 6   allopurinol (ZYLOPRIM) 100 MG tablet TAKE 2 TABLETS (200 MG TOTAL) BY MOUTH DAILY. 60 tablet 2   apixaban (ELIQUIS) 5 MG TABS tablet  Take 1 tablet (5 mg total) by mouth 2 (two) times daily. 60 tablet 11   atorvastatin (LIPITOR) 80 MG tablet Take 1 tablet (80 mg total) by mouth daily. 90 tablet 1   baclofen (LIORESAL) 10 MG tablet Take 1 tablet (10 mg total) by mouth 3 (three) times daily. 30 each 0   budesonide-formoterol (SYMBICORT) 160-4.5 MCG/ACT inhaler Inhale 2 puffs into the lungs 2 (two) times daily. 30.6 g 6   carvedilol (COREG) 12.5 MG tablet Take 1 tablet (12.5 mg total) by mouth 2 (two) times daily. 180 tablet 3   dapagliflozin propanediol (FARXIGA) 10 MG TABS tablet Take 1 tablet (10 mg total) by mouth daily. 90 tablet 3   hydrOXYzine (ATARAX) 25 MG tablet Take 1 tablet 3 times a day as needed for anxiety. Take 2 tablets at night as need for sleep 90 tablet 1   isosorbide-hydrALAZINE (BIDIL) 20-37.5 MG tablet Take 1 tablet by mouth 3 (three) times daily. 270 tablet 3   mirtazapine (REMERON) 15 MG tablet Take 1 tablet (15 mg total) by mouth at bedtime. 30 tablet 1   potassium chloride (KLOR-CON M) 10 MEQ tablet Take 2 tablets (20 mEq total) by mouth daily. 180 tablet 3   Tiotropium Bromide Monohydrate (SPIRIVA RESPIMAT) 2.5 MCG/ACT AERS Inhale 2 puffs into the lungs daily. 4 g 6   torsemide 40 MG TABS Take 20 mg daily, alternating with 40 mg daily. 30 tablet 0   traMADol (ULTRAM) 50 MG tablet Take 1 tablet (50 mg total) by mouth every 6 (six) hours as needed. 15 tablet 0   albuterol (PROVENTIL) (2.5 MG/3ML) 0.083% nebulizer solution Take 3 mLs (2.5 mg total) by nebulization every 4 (four) hours as needed for wheezing or shortness of breath. 300 mL 2   No current facility-administered medications for this visit.   BP 130/79   Pulse 74   Wt 270 lb (122.5 kg)   SpO2 97%   BMI 36.62 kg/m  General: NAD Neck: No JVD,  no thyromegaly or thyroid nodule.  Lungs: Clear to auscultation bilaterally with normal respiratory effort. CV: Nondisplaced PMI.  Heart regular S1/S2, no S3/S4, no murmur.  No peripheral edema.  No  carotid bruit.  Normal pedal pulses.  Abdomen: Soft, nontender, no hepatosplenomegaly, no distention.  Skin: Intact without lesions or rashes.  Neurologic: Alert and oriented x 3.  Psych: Normal affect. Extremities: No clubbing or cyanosis.  HEENT: Normal.   1. Chronic diastolic CHF/?hypertrophic cardiomyopathy: Prior history of HFrEF that may have been tachycardia-mediated in setting of atrial flutter in 1/22 vs cocaine-related, now EF has recovered to 50% on last study. Cardiolite in 2/23 showed no ischemia, fixed inferior defect.  Echo in 8/23 showed EF 50% with severe asymmetric septal hypertrophy but no SAM or LVOT gradient.  Cardiac MRI in 12/23 was similar with LV EF 50%, severe asymmetric septal hypertrophy, normal RV, LGE in the basal septum. These studies were concerning for hypertrophic cardiomyopathy.  RHC in 1/24 showed elevated filling pressures with CI 2.25.  His grandfather may have had sudden cardiac death at work, father died at 52 from "heart disease."  He has had syncopal episodes that may have been orthostatic, but I worried about ventricular arrhythmias given possible HCM and LGE noted on cardiac MRI. Zio monitor in 2/24 showed 1.5% PVCs and 6 short NSVT runs (longest 7 beats).  Nothing to explain syncope and has had no further episodes. 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  PKP2 gene is associated with ARVC and Brugada, but the clinical significance of the mutation found in this patient is uncertain (not reported in literature).  She was seen by Dr. Jomarie Longs, mutations not thought to be clinically significant.  Echo in 5/24 showed EF 45-50%, moderate asymmetric septal hypertrophy with no LVOT gradient of mitral valve SAM, RV normal, mild-moderate MR, IVC normal. Cath in  6/24 showed nonobstructive CAD, normal filling pressures, low CI. 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  NYHA class II, weight up but not volume overloaded on exam.  - He was seen by Dr. Nelly Laurence for EP, thought to be borderline for ICD.  After discussion, he decided against placement.  - With no LVOT obstruction or significant murmur, ok to continue Bidil.  - Continue Coreg 12.5 mg bid.  - I will continue torsemide 40 mg daily alternating with 20 mg daily, BMET/BNP today.  - Continue dapagliflozin 10 mg daily.  2. CKD stage 3: BMET today. Has referral to nephrology.   3. COPD: Patient is active smoker and carries this history.  I strongly encouraged him to quit smoking today.  He is smoking much less than before.   4. Atrial flutter: S/p DCCV in 1/22.  NSR today by exam.  - Continue apixaban.  5. Cocaine abuse: UDS positive in 7/24.   Says this was a one-time event.  6. 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  - Atorvastatin recently increased, lipids/LFTs today.    Followup in 3 months, increase exercise.   Marca Ancona 02/25/2023

## 2023-02-26 NOTE — Progress Notes (Deleted)
BH MD Outpatient Progress Note  02/26/2023 3:46 PM LEVESTER RISTIC  MRN:  [id number]  Assessment:  Patient reports improved mood and mildly less anxiety since starting mirtazapine. Sleep initiation is still a problem so plan to start taking hydroxyzine at night. Can also go back to trazodone. Quetiapine was considered but [name] is relatively high still in July 2024.   Identifying Information: Chris [name] is a 62 y.o. y.o. male with a history of depression, hypertension, obesity, HFrEF (initial EF 25%, normalized to 50%) atrial flutter s/p DCCV 06/2020, COPD, current smoker x40+ years, former cocaine use x25+ years, HCM, CKD who is an established patient with [facility] for medication management.   Plan: # Major Depressive Disorder-recurrent episode, moderate Past medication trials:  Status of problem: improving Interventions: -- Continue mirtazapine 15 mg at bedtime for depression, insomnia, and appetite stimulation -- Continue hydroxyzine 25 mg tid prn for anxiety -- START hydroxyzine 50 mg at bedtime as needed for sleep  #Nicotine use disorder --Encourage cessation   Patient was given contact information for behavioral health clinic and was instructed to call 911 for emergencies.   Subjective:  Chief Complaint:  No chief complaint on file.   Interval History:  Patient reports depression has improved since last seeing him.  He does report sleep problems still mainly with sleep initiation.  He reports that once he actually falls asleep he stays sound asleep.  He states he is gets approximately 4 hours per night.  We discussed medication options and patient has elected to use hydroxyzine at 50 mg at night to aid with sleep.  He reports he occasionally uses hydroxyzine as needed for anxiety and has had some benefit.  He continues to deny SI/HI/AVH.  He reports appetite has been fair. All questions addressed.     Visit Diagnosis:  No diagnosis  found.    Past Psychiatric History:  Diagnoses: none Medication trials: duloxetine Previous psychiatrist/therapist: none Hospitalizations: none Suicide attempts: denies SIB: denies Hx of violence towards others: denies Current access to guns: denies  Past Medical History:  Past Medical History:  Diagnosis Date   Arrhythmia    atrial flutter   CHF (congestive heart failure) (HCC)    Chronic kidney disease    COPD (chronic obstructive pulmonary disease) (HCC)    Coronary artery disease    Depression    Diabetes mellitus without complication (HCC)    GERD (gastroesophageal reflux disease)    Gout    Hypertension    Influenza A with respiratory manifestations    Mental disorder     Past Surgical History:  Procedure Laterality Date   ANKLE SURGERY     CARDIAC CATHETERIZATION     CARDIOVERSION N/A 07/08/2020   Procedure: CARDIOVERSION;  Surgeon: Lamar Blinks, MD;  Location: [facility] ORS;  Service: Cardiovascular;  Laterality: N/A;   COLONOSCOPY WITH PROPOFOL N/A 11/19/2021   Procedure: COLONOSCOPY WITH PROPOFOL;  Surgeon: Jenel Lucks, MD;  Location: [facility];  Service: Gastroenterology;  Laterality: N/A;   HERNIA REPAIR     x2   IR RADIOLOGIST EVAL & MGMT  05/13/2022   IR RADIOLOGIST EVAL & MGMT  10/27/2022   POLYPECTOMY  11/19/2021   Procedure: POLYPECTOMY;  Surgeon: Jenel Lucks, MD;  Location: [facility];  Service: Gastroenterology;;   RADIOLOGY WITH ANESTHESIA N/A 06/23/2022   Procedure: Right renal tumor ablation;  Surgeon: Bennie Dallas, MD;  Location: [facility] OR;  Service: Radiology;  Laterality: N/A;   RIGHT HEART  CATH N/A 07/13/2022   Procedure: RIGHT HEART CATH;  Surgeon: Laurey Morale, MD;  Location: [facility] INVASIVE CV LAB;  Service: Cardiovascular;  Laterality: N/A;   RIGHT/LEFT HEART CATH AND CORONARY ANGIOGRAPHY N/A 11/19/2022   Procedure: RIGHT/LEFT HEART CATH AND CORONARY ANGIOGRAPHY;  Surgeon: Laurey Morale, MD;  Location: [facility] INVASIVE CV LAB;   Service: Cardiovascular;  Laterality: N/A;   SHOULDER SURGERY     TEE WITHOUT CARDIOVERSION N/A 07/08/2020   Procedure: TRANSESOPHAGEAL ECHOCARDIOGRAM (TEE);  Surgeon: Lamar Blinks, MD;  Location: [facility] ORS;  Service: Cardiovascular;  Laterality: N/A;    Family History:  Family History  Problem Relation Age of Onset   Heart disease Father    Diabetes Mother    HIV Brother    Healthy Son    Healthy Daughter     Social History:  Social History   Socioeconomic History   Marital status: Divorced    Spouse name: Not on file   Number of children: 3   Years of education: Not on file   Highest education level: High school graduate  Occupational History   Occupation: disability  Tobacco Use   Smoking status: Every Day    Current packs/day: 1.00    Average packs/day: 1 pack/day for 43.0 years (43.0 ttl pk-yrs)    Types: Cigarettes   Smokeless tobacco: Never   Tobacco comments:        4 cigs daily--05/12/2022  Vaping Use   Vaping status: Never Used  Substance and Sexual Activity   Alcohol use: Yes    Alcohol/week: 4.0 standard drinks of alcohol    Types: 4 Shots of liquor per week   Drug use: Yes    Frequency: 21.0 times per week    Types: Marijuana    Comment: last use Cocaine- 03/28/2021. Still using marijuana, last use 06/22/21   Sexual activity: Not on file  Other Topics Concern   Not on file  Social History Narrative   ** Merged History Encounter **       Social Determinants of Health   Financial Resource Strain: Medium Risk (07/23/2022)   Overall Financial Resource Strain (CARDIA)    Difficulty of Paying Living Expenses: Somewhat hard  Food Insecurity: No Food Insecurity (04/20/2022)   Hunger Vital Sign    Worried About Running Out of Food in the Last Year: Never true    Ran Out of Food in the Last Year: Never true  Transportation Needs: Unmet Transportation Needs (02/03/2023)   PRAPARE - Transportation    Lack of Transportation (Medical): Yes    Lack of  Transportation (Non-Medical): Yes  Physical Activity: Insufficiently Active (11/16/2021)   Exercise Vital Sign    Days of Exercise per Week: 5 days    Minutes of Exercise per Session: 20 min  Stress: Stress Concern Present (01/17/2023)   [employer] of Occupational Health - Occupational Stress Questionnaire    Feeling of Stress : To some extent  Social Connections: Moderately Isolated (07/23/2022)   Social Connection and Isolation Panel [NHANES]    Frequency of Communication with Friends and Family: Twice a week    Frequency of Social Gatherings with Friends and Family: Once a week    Attends Religious Services: More than 4 times per year    Active Member of [employer] or Organizations: No    Attends Banker Meetings: Never    Marital Status: Divorced    Allergies: No Known Allergies  Current Medications: Current Outpatient Medications  Medication Sig Dispense  Refill   Accu-Chek Softclix Lancets lancets Use as instructed 100 each 12   acetaminophen (TYLENOL) 500 MG tablet Take 1-2 tablets (500-1,000 mg total) by mouth every 6 (six) hours as needed (pain.). 30 tablet 0   albuterol (PROVENTIL) (2.5 MG/3ML) 0.083% nebulizer solution Take 3 mLs (2.5 mg total) by nebulization every 4 (four) hours as needed for wheezing or shortness of breath. 300 mL 2   albuterol (VENTOLIN HFA) 108 (90 Base) MCG/ACT inhaler Inhale 2 puffs into the lungs every 6 (six) hours as needed for wheezing or shortness of breath. 54 g 6   allopurinol (ZYLOPRIM) 100 MG tablet TAKE 2 TABLETS (200 MG TOTAL) BY MOUTH DAILY. 60 tablet 2   apixaban (ELIQUIS) 5 MG TABS tablet Take 1 tablet (5 mg total) by mouth 2 (two) times daily. 60 tablet 11   atorvastatin (LIPITOR) 80 MG tablet Take 1 tablet (80 mg total) by mouth daily. 90 tablet 1   baclofen (LIORESAL) 10 MG tablet Take 1 tablet (10 mg total) by mouth 3 (three) times daily. 30 each 0   budesonide-formoterol (SYMBICORT) 160-4.5 MCG/ACT inhaler Inhale 2 puffs  into the lungs 2 (two) times daily. 30.6 g 6   carvedilol (COREG) 12.5 MG tablet Take 1 tablet (12.5 mg total) by mouth 2 (two) times daily. 180 tablet 3   dapagliflozin propanediol (FARXIGA) 10 MG TABS tablet Take 1 tablet (10 mg total) by mouth daily. 90 tablet 3   hydrOXYzine (ATARAX) 25 MG tablet Take 1 tablet 3 times a day as needed for anxiety. Take 2 tablets at night as need for sleep 90 tablet 1   isosorbide-hydrALAZINE (BIDIL) 20-37.5 MG tablet Take 1 tablet by mouth 3 (three) times daily. 270 tablet 3   mirtazapine (REMERON) 15 MG tablet Take 1 tablet (15 mg total) by mouth at bedtime. 30 tablet 1   potassium chloride (KLOR-CON M) 10 MEQ tablet Take 2 tablets (20 mEq total) by mouth daily. 180 tablet 3   Tiotropium Bromide Monohydrate (SPIRIVA RESPIMAT) 2.5 MCG/ACT AERS Inhale 2 puffs into the lungs daily. 4 g 6   torsemide 40 MG TABS Take 20 mg daily, alternating with 40 mg daily. 30 tablet 0   traMADol (ULTRAM) 50 MG tablet Take 1 tablet (50 mg total) by mouth every 6 (six) hours as needed. 15 tablet 0   No current facility-administered medications for this visit.    ROS:   Objective:  Psychiatric Specialty Exam: There were no vitals taken for this visit.There is no height or weight on file to calculate BMI.  General Appearance: Casual  Eye Contact:  Good  Speech:  Clear and Coherent  Volume:  Normal  Mood:  Anxious and Depressed  Affect:  Appropriate and Constricted  Thought Process:  Coherent, Goal Directed, and Linear  Orientation:  Full (Time, Place, and Person)  Thought Content: Logical   Suicidal Thoughts:  No  Homicidal Thoughts:  No  Memory:  Negative  Judgment:  Intact  Insight:  Fair  Psychomotor Activity:  Normal  Concentration:  Concentration: Fair              Assets:  Communication Skills Desire for Improvement Financial Resources/Insurance Housing  ADL's:  Intact  Cognition: WNL  Sleep:  Poor   PE: General: fatigued, fairly groomed Pulm:  no increased work of breathing on room air  Strength & Muscle Tone: within normal limits Neuro: no focal neurological deficits observed  Gait & Station: slight limp when walking  Metabolic Disorder Labs:  Lab Results  Component Value Date   HGBA1C 6.1 (H) 01/05/2023   MPG 131.24 04/20/2022   MPG 151.33 02/09/2021   No results found for: "PROLACTIN" Lab Results  Component Value Date   CHOL 155 02/25/2023   TRIG 376 (H) 02/25/2023   HDL 27 (L) 02/25/2023   CHOLHDL 5.7 02/25/2023   VLDL 75 (H) 02/25/2023   LDLCALC 53 02/25/2023   LDLCALC 125 (H) 12/22/2022   Lab Results  Component Value Date   TSH 1.592 10/26/2019   TSH 0.842 11/30/2018    Therapeutic Level Labs: No results found for: "LITHIUM" No results found for: "VALPROATE" No results found for: "CBMZ"  Screenings: AUDIT    Flowsheet Row [facility]  Alcohol Use Disorder Identification Test Final Score (AUDIT) 1      GAD-7    Flowsheet Row Office Visit from 01/05/2023 in [facility] Counselor from 09/23/2022 in [facility] Office Visit from 08/09/2022 in [facility] Office Visit from 05/11/2022 in [facility] Office Visit from 12/21/2021 in [facility]  Total GAD-7 Score 0 20 15 16 8       PHQ2-9    Flowsheet Row Office Visit from 01/05/2023 in [facility] Family Medicine Counselor from 09/23/2022 in [facility] Office Visit from 08/26/2022 in [facility] Office Visit from 08/09/2022 in [facility] Patient Outreach Telephone from 07/08/2022 in [place] POPULATION HEALTH DEPARTMENT  PHQ-2 Total Score 0 6 5 5 4   PHQ-9 Total Score -- 21 17 17 16       Flowsheet Row ED from 12/20/2022 in  [facility] Department at [facility] ED from 12/09/2022 in [facility] Emergency Department at [facility] Admission (Discharged) from 11/19/2022 in [facility] CARDIAC CATH LAB  C-SSRS RISK CATEGORY No Risk No Risk No Risk       Collaboration of Care: Collaboration of Care:   Patient/Guardian was advised Release of Information must be obtained prior to any record release in order to collaborate their care with an outside provider. Patient/Guardian was advised if they have not already done so to contact the registration department to sign all necessary forms in order for [place] to release information regarding their care.   Consent: Patient/Guardian gives verbal consent for treatment and assignment of benefits for services provided during this visit. Patient/Guardian expressed understanding and agreed to proceed.   A total of 30 minutes was spent involved in face to face clinical care, chart review, and documentation.   Park Pope, MD 02/26/2023, 3:46 PM

## 2023-03-01 NOTE — Progress Notes (Signed)
Referring Physician(s): Irineo Axon, MD   Reason for follow up: Virtual telephone clinic visit 8 months after right renal mass microwave ablation   History of present illness: Initial HPI from 05/13/22: Chris [name] is a 62 y.o. male with history of recently diagnosed right renal mass concerning for renal cell carcinoma.  He was admitted at [facility] for acute respiratory failure and due to CKD III received a renal ultrasound on 04/22/22 which incidentally noted a 2.9 cm solid mass.  This was further evaluated by MRI on 04/23/22 which demonstrated an enhancing mass concerning for renal cell carcinoma.   His respiratory symptoms have improved significantly since discharge home.  He denies any symptoms related to his mass including pain, discomfort, hematuria, or any history of urinary tract infections.  Chris [name] underwent CT-guided right renal mass biopsy and microwave ablation on 06/23/22.  The procedure was uncomplicated and he was discharged home the same day. 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 Initial follow up MRI was performed on 10/18/22 and this demonstrated successful ablation without residual enhancement.   At his 10/27/22 follow up he had recovered well from the procedure and denied right flank pain, hematuria, dysuria.  He reported overall sluggishness/fatigue and also endorsed significant pain on left side, which is chronic. He described this as just superior and lateral to the left buttock. He stated that when he was in prison during a weightlifting competition he did 70 dead lifts and felt something pop in that area and it has bothered him since. Interestingly, on a CT abdomen/pelvis from January of this year there are subcutaneous dystrophic appearing calcifications in this region. We discussed repeat imaging in 3 months and I also told him I would contact Dr. Laural Benes regarding his CT finding in the left buttock given his chronic pain is located in  that region.   The patient presents today via tele-health visit to discuss his 02/24/23 MR Abdomen.   Past Medical History:  Diagnosis Date   Arrhythmia    atrial flutter   CHF (congestive heart failure) (HCC)    Chronic kidney disease    COPD (chronic obstructive pulmonary disease) (HCC)    Coronary artery disease    Depression    Diabetes mellitus without complication (HCC)    GERD (gastroesophageal reflux disease)    Gout    Hypertension    Influenza A with respiratory manifestations    Mental disorder     Past Surgical History:  Procedure Laterality Date   ANKLE SURGERY     CARDIAC CATHETERIZATION     CARDIOVERSION N/A 07/08/2020   Procedure: CARDIOVERSION;  Surgeon: Lamar Blinks, MD;  Location: [facility] ORS;  Service: Cardiovascular;  Laterality: N/A;   COLONOSCOPY WITH PROPOFOL N/A 11/19/2021   Procedure: COLONOSCOPY WITH PROPOFOL;  Surgeon: Jenel Lucks, MD;  Location: [facility];  Service: Gastroenterology;  Laterality: N/A;   HERNIA REPAIR     x2   IR RADIOLOGIST EVAL & MGMT  05/13/2022   IR RADIOLOGIST EVAL & MGMT  10/27/2022   POLYPECTOMY  11/19/2021   Procedure: POLYPECTOMY;  Surgeon: Jenel Lucks, MD;  Location: [facility];  Service: Gastroenterology;;   RADIOLOGY WITH ANESTHESIA N/A 06/23/2022   Procedure: Right renal tumor ablation;  Surgeon: Bennie Dallas, MD;  Location: [facility] OR;  Service: Radiology;  Laterality: N/A;   RIGHT HEART CATH N/A 07/13/2022   Procedure: RIGHT HEART CATH;  Surgeon: Laurey Morale, MD;  Location: [facility] INVASIVE CV LAB;  Service: Cardiovascular;  Laterality: N/A;   RIGHT/LEFT HEART CATH AND CORONARY ANGIOGRAPHY N/A 11/19/2022   Procedure: RIGHT/LEFT HEART CATH AND CORONARY ANGIOGRAPHY;  Surgeon: Laurey Morale, MD;  Location: [facility] INVASIVE CV LAB;  Service: Cardiovascular;  Laterality: N/A;   SHOULDER SURGERY     TEE WITHOUT CARDIOVERSION N/A 07/08/2020   Procedure: TRANSESOPHAGEAL ECHOCARDIOGRAM (TEE);  Surgeon: Lamar Blinks, MD;  Location: [facility] ORS;  Service: Cardiovascular;  Laterality: N/A;    Allergies: Patient has no known allergies.  Medications: Prior to Admission medications   Medication Sig Start Date End Date Taking? Authorizing Provider  Accu-Chek Softclix Lancets lancets Use as instructed 04/28/22   [name], MD  acetaminophen (TYLENOL) 500 MG tablet Take 1-2 tablets (500-1,000 mg total) by mouth every 6 (six) hours as needed (pain.). 12/09/22   Gerhard Munch, MD  albuterol (PROVENTIL) (2.5 MG/3ML) 0.083% nebulizer solution Take 3 mLs (2.5 mg total) by nebulization every 4 (four) hours as needed for wheezing or shortness of breath. 01/05/23 02/04/23  Anders Simmonds, PA-C  albuterol (VENTOLIN HFA) 108 (90 Base) MCG/ACT inhaler Inhale 2 puffs into the lungs every 6 (six) hours as needed for wheezing or shortness of breath. 12/08/22   [name], MD  allopurinol (ZYLOPRIM) 100 MG tablet TAKE 2 TABLETS (200 MG TOTAL) BY MOUTH DAILY. 01/05/23   Anders Simmonds, PA-C  apixaban (ELIQUIS) 5 MG TABS tablet Take 1 tablet (5 mg total) by mouth 2 (two) times daily. 12/08/22   Laurey Morale, MD  atorvastatin (LIPITOR) 80 MG tablet Take 1 tablet (80 mg total) by mouth daily. 12/23/22 03/23/23  Laurey Morale, MD  baclofen (LIORESAL) 10 MG tablet Take 1 tablet (10 mg total) by mouth 3 (three) times daily. 12/20/22   Arthor Captain, PA-C  budesonide-formoterol (SYMBICORT) 160-4.5 MCG/ACT inhaler Inhale 2 puffs into the lungs 2 (two) times daily. 12/08/22   [name], MD  carvedilol (COREG) 12.5 MG tablet Take 1 tablet (12.5 mg total) by mouth 2 (two) times daily. 12/08/22   Laurey Morale, MD  dapagliflozin propanediol (FARXIGA) 10 MG TABS tablet Take 1 tablet (10 mg total) by mouth daily. 12/08/22   [name], MD  hydrOXYzine (ATARAX) 25 MG tablet Take 1 tablet 3 times a day as needed for anxiety. Take 2 tablets at night as need for sleep 01/28/23   Park Pope, MD   isosorbide-hydrALAZINE (BIDIL) 20-37.5 MG tablet Take 1 tablet by mouth 3 (three) times daily. 08/03/22   [name], MD  mirtazapine (REMERON) 15 MG tablet Take 1 tablet (15 mg total) by mouth at bedtime. 01/28/23 03/29/23  Park Pope, MD  potassium chloride (KLOR-CON M) 10 MEQ tablet Take 2 tablets (20 mEq total) by mouth daily. 12/08/22   Laurey Morale, MD  Tiotropium Bromide Monohydrate (SPIRIVA RESPIMAT) 2.5 MCG/ACT AERS Inhale 2 puffs into the lungs daily. 12/08/22   [name], MD  torsemide 40 MG TABS Take 20 mg daily, alternating with 40 mg daily. 12/09/22   Gerhard Munch, MD  traMADol (ULTRAM) 50 MG tablet Take 1 tablet (50 mg total) by mouth every 6 (six) hours as needed. 12/20/22   Arthor Captain, PA-C  metoprolol tartrate (LOPRESSOR) 100 MG tablet Take 1 tablet (100 mg total) by mouth 2 (two) times daily. 07/09/20 07/11/20  Marrion Coy, MD     Family History  Problem Relation Age of Onset   Heart disease Father    Diabetes Mother  HIV Brother    Healthy Son    Healthy Daughter     Social History   Socioeconomic History   Marital status: Divorced    Spouse name: Not on file   Number of children: 3   Years of education: Not on file   Highest education level: High school graduate  Occupational History   Occupation: disability  Tobacco Use   Smoking status: Every Day    Current packs/day: 1.00    Average packs/day: 1 pack/day for 43.0 years (43.0 ttl pk-yrs)    Types: Cigarettes   Smokeless tobacco: Never   Tobacco comments:        4 cigs daily--05/12/2022  Vaping Use   Vaping status: Never Used  Substance and Sexual Activity   Alcohol use: Yes    Alcohol/week: 4.0 standard drinks of alcohol    Types: 4 Shots of liquor per week   Drug use: Yes    Frequency: 21.0 times per week    Types: Marijuana    Comment: last use Cocaine- 03/28/2021. Still using marijuana, last use 06/22/21   Sexual activity: Not on file  Other Topics Concern   Not on  file  Social History Narrative   ** Merged History Encounter **       Social Determinants of Health   Financial Resource Strain: Medium Risk (07/23/2022)   Overall Financial Resource Strain (CARDIA)    Difficulty of Paying Living Expenses: Somewhat hard  Food Insecurity: No Food Insecurity (04/20/2022)   Hunger Vital Sign    Worried About Running Out of Food in the Last Year: Never true    Ran Out of Food in the Last Year: Never true  Transportation Needs: Unmet Transportation Needs (02/03/2023)   PRAPARE - Transportation    Lack of Transportation (Medical): Yes    Lack of Transportation (Non-Medical): Yes  Physical Activity: Insufficiently Active (11/16/2021)   Exercise Vital Sign    Days of Exercise per Week: 5 days    Minutes of Exercise per Session: 20 min  Stress: Stress Concern Present (01/17/2023)   [employer] of Occupational Health - Occupational Stress Questionnaire    Feeling of Stress : To some extent  Social Connections: Moderately Isolated (07/23/2022)   Social Connection and Isolation Panel [NHANES]    Frequency of Communication with Friends and Family: Twice a week    Frequency of Social Gatherings with Friends and Family: Once a week    Attends Religious Services: More than 4 times per year    Active Member of [employer] or Organizations: No    Attends Banker Meetings: Never    Marital Status: Divorced     Vital Signs: There were no vitals taken for this visit.  No physical exam was performed in lieu of virtual telephone visit.   Imaging: MRI Abdomen 04/23/22   Right upper pole enhancing mass 2.8 x 2.5 x 2.3 cm   MRI abdomen 10/18/22  IMPRESSION: 1. Upper pole right renal ablation without residual, locally recurrent, or metastatic disease. 2. Hepatomegaly 3. Renal cysts and Bosniak 2 hemorrhagic/proteinaceous cysts. No specific follow-up indicated. 4. Aortic Atherosclerosis (ICD10-I70.0).     Labs:  CBC: Recent Labs    09/28/22 1216  11/10/22 1055 11/19/22 0817 12/09/22 0930 12/20/22 1007  WBC 8.7 8.1  --  9.4 9.8  HGB 14.4 14.6 15.0  14.3 14.8 15.8  HCT 45.2 44.8 44.0  42.0 46.0 50.3  PLT 187 180  --  166 171    COAGS: Recent  Labs    06/23/22 0946  INR 1.0    BMP: Recent Labs    11/10/22 1055 11/19/22 0817 12/09/22 0930 12/20/22 1007 01/05/23 0903 02/25/23 1120  NA 139   < > 139 139 139 138  K 4.2   < > 3.4* 4.1 4.7 4.5  CL 107  --  106 106 100 105  CO2 23  --  25 23 22 25   GLUCOSE 94  --  117* 90 69* 75  BUN 24*  --  17 29* 61* 41*  CALCIUM 8.9  --  8.9 8.9 9.4 8.8*  CREATININE 1.94*  --  1.76* 2.15* 2.73* 2.15*  GFRNONAA 39*  --  44* 34*  --  34*   < > = values in this interval not displayed.    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 He has recovered well since the procedure. Initial follow up imaging demonstrates successful ablation without residual enhancement.   Electronically Signed: Mickie Kay 03/01/2023, 9:47 AM   I spent a total of 25 Minutes in virtual telephone clinical consultation, greater than 50% of which was counseling/coordinating care for renal cell carcinoma.

## 2023-03-02 ENCOUNTER — Ambulatory Visit
Admission: RE | Admit: 2023-03-02 | Discharge: 2023-03-02 | Disposition: A | Discharge status: Home / Self Care | Payer: Medicaid Other | Source: Ambulatory Visit | Attending: Interventional Radiology

## 2023-03-02 DIAGNOSIS — C642 Malignant neoplasm of left kidney, except renal pelvis: Secondary | ICD-10-CM

## 2023-03-02 DIAGNOSIS — C641 Malignant neoplasm of right kidney, except renal pelvis: Secondary | ICD-10-CM | POA: Diagnosis not present

## 2023-03-02 HISTORY — PX: IR RADIOLOGIST EVAL & MGMT: IMG5224

## 2023-03-03 ENCOUNTER — Encounter (HOSPITAL_COMMUNITY): Payer: Medicaid Other | Admitting: Student

## 2023-03-04 ENCOUNTER — Other Ambulatory Visit: Payer: Medicaid Other | Admitting: Licensed Clinical Social Worker

## 2023-03-04 NOTE — Patient Outreach (Signed)
Medicaid Managed Care Social Work Note  03/04/2023 Name:  Chris [name] MRN:  [id number] DOB:  10-28-1960  Chris [name] is an 62 y.o. year old male who is a primary patient of Chris Matar, MD.  The Medicaid Managed Care Coordination team was consulted for assistance with:  Mental Health Counseling and Resources  Chris [name] was given information about Medicaid Managed Care Coordination team services today. Chris [name] Patient agreed to services and verbal consent obtained.  Engaged with patient  for by telephone forfollow up visit in response to referral for case management and/or care coordination services.   Assessments/Interventions:  Review of past medical history, allergies, medications, health status, including review of consultants reports, laboratory and other test data, was performed as part of comprehensive evaluation and provision of chronic care management services.  SDOH: (Social Determinant of Health) assessments and interventions performed: SDOH Interventions    Flowsheet Row Patient Outreach Telephone from 03/04/2023 in [place] POPULATION HEALTH DEPARTMENT Patient Outreach Telephone from 01/17/2023 in [place] POPULATION HEALTH DEPARTMENT Telephone from 12/23/2022 in [place] POPULATION HEALTH DEPARTMENT Patient Outreach Telephone from 12/17/2022 in [place] POPULATION HEALTH DEPARTMENT Patient Outreach Telephone from 11/10/2022 in [place] POPULATION HEALTH DEPARTMENT Patient Outreach Telephone from 10/25/2022 in [place] POPULATION HEALTH DEPARTMENT  SDOH Interventions        Transportation Interventions -- -- Payor Benefit -- -- --  Financial Strain Interventions Other (Comment)  [facility] [name] involved and has a F/U appt] -- -- -- -- --  Stress Interventions Offered [employer], Provide Counseling Offered [employer] Resources, Provide Counseling -- Offered [employer] Resources, Provide Counseling Offered [employer]  Resources, Provide Counseling  [I am feeling better since I seen my doctor] Provide Counseling, Offered [employer] Resources       Advanced Directives Status:  See Care Plan for related entries.  Care Plan                 No Known Allergies  Medications Reviewed Today   Medications were not reviewed in this encounter     Patient Active Problem List   Diagnosis Date Noted   Post traumatic stress disorder 09/23/2022   MDD (major depressive disorder), recurrent episode, moderate (HCC) 08/26/2022   Stage 3b chronic kidney disease (HCC) 08/09/2022   Hypertrophic cardiomyopathy (HCC) 08/09/2022   History of renal cell cancer 08/09/2022   Chronic heart failure with preserved ejection fraction (HFpEF) (HCC) 07/13/2022   Agitation 04/23/2022   RSV (respiratory syncytial virus pneumonia) 04/22/2022   Obesity (BMI 30-39.9) 04/21/2022   COPD exacerbation (HCC) 04/19/2022   Dyslipidemia 04/19/2022   Gout 04/19/2022   Right kidney mass 05/14/2021   CHF exacerbation (HCC) 04/14/2021   Chest pain 04/14/2021   Syncope 04/14/2021   Left-sided weakness 10/28/2020   Typical atrial flutter (HCC)    CHF (congestive heart failure) (HCC) 07/04/2020   Acute exacerbation of CHF (congestive heart failure) (HCC) 06/16/2020   Influenza vaccine refused 05/06/2020   Acute decompensated heart failure (HCC) 05/04/2020   Illiteracy 05/04/2020   Type 2 diabetes mellitus with stage 3 chronic kidney disease (HCC) 12/25/2019   Elevated troponin I level 10/26/2019   History of gout 02/01/2019   Seasonal allergic rhinitis due to pollen 02/01/2019   Nicotine use disorder 11/30/2018   Microscopic hematuria 11/30/2018   Depression 11/30/2018   Difficulty controlling anger 11/30/2018   COPD (chronic obstructive pulmonary disease) (HCC)    CKD (chronic kidney disease) stage 3,  GFR 30-59 ml/min (HCC) 08/10/2018   Recurrent epistaxis 04/21/2018   Mixed hyperlipidemia 07/28/2017   Essential hypertension  07/28/2017   Chronic systolic heart failure (HCC) 10/25/2014   Cocaine abuse (HCC) 02/20/2013   Cannabis abuse 02/20/2013   Back pain, chronic 02/20/2013    Conditions to be addressed/monitored per PCP order:  Anxiety and Depression  Care Plan : General Social Work (Adult)  Updates made by Chris Bryant, LCSW since 03/04/2023 12:00 AM     Problem: Depression Identification (Depression)      Long-Range Goal: To increase my self care and gain mental health support   Start Date: 07/08/2022  Priority: High  Note:   Timeframe:  Long-Range Goal Priority:  High Start Date:   07/08/22                    Expected End Date:ongoing    Follow Up Date- 03/30/23 at 9:15 am   - begin personal counseling - call and visit an old friend - check out volunteer opportunities - join a support group - laugh; watch a funny movie or comedian - learn and use visualization or guided imagery - perform a random act of kindness - practice relaxation or meditation daily - start or continue a personal journal - talk about feelings with a friend, family or spiritual advisor - practice positive thinking and self-talk    Why is this important?   When you are stressed, down or upset, your body reacts too.  For example, your blood pressure may get higher; you may have a headache or stomachache.  When your emotions get the best of you, your body's ability to fight off cold and flu gets weak.  These steps will help you manage your emotions.   Current Barriers:  Chronic Mental Health needs related to depression and need for housing Limited social support, Housing barriers, and Mental Health Concerns  Social Isolation ADL IADL limitations Suicidal Ideation/Homicidal Ideation: No  Clinical Social Work Goal(s):  Over the next 120 days, patient will work with LCSW monthly by telephone or in person to reduce or manage symptoms related to depression and relapse prevention patient will work with [name] to address  needs related to financial, food and transportation support. Patient wishes to offset medical and housing expenses with financial support as housing resources are very limited at this time  Interventions: Patient interviewed and appropriate assessments performed: brief mental health assessment Discussed plans with patient for ongoing care management follow up and provided patient with direct contact information for care management team Assisted patient/caregiver with obtaining information about health plan benefits Patient was educated on the Advance Directives process. Patient was encouraged to complete document with PCP.  [facility] LCSW provided emotional support and reflective listening.  Solution-Focused Strategies, Mindfulness or Relaxation Training, Active listening / Reflection utilized , Emotional Supportive Provided, and Verbalization of feelings encouraged  Patient declines any current substance use. Patient has a history of cocaine abuse. Relapse prevention education provided.  Patient reports that he has developed social anxiety. He admits that he gets irritable at times and will snap at his loved ones and then will experience guilt afterwards. Educated patient on coping methods to implement into his daily life to combat anxiety symptoms and stress. Patient denied any current suicidal or homicidal ideations. Encouraged patient to implement deep breathing and grounding exercises into his daily routine due to ongoing anxiety and SOB.  Patient reports that he will start implementing appropriate self-care habits into his daily  routine such as: drinking water, staying active around the house, taking his medications as prescribed, combating negative thoughts or emotions and staying connected with his support network. Positive reinforcement provided.  Past Update- Patient missed his psychiatry appointment yesterday at [facility] which makes the second no show. He is at risk of not being able to be seen at  their practice and was advised to contact them today to inquire. Patient reports that he will be able to attend his upcoming counseling appointment on 03/15/23. [facility] LCSW provided extensive self-care education which including keeping up with his appointments. Patient was advised to use a calendar to assist with his scheduling. Patient reports that he is stable and his mood management continues to improve.   Patient Self Care Activities:  Ability for insight Agreement to start mental health treatment and to the self-care journey   Patient Coping Strengths:  Self Advocate Able to Communicate Effectively  Patient Self Care Deficits:  Lacks social connections  The following coping skill education was provided for stress relief and mental health management: "When your car dies or a deadline looms, how do you respond? Long-term, low-grade or acute stress takes a serious toll on your body and mind, so don't ignore feelings of constant tension. Stress is a natural part of life. However, too much stress can harm our health, especially if it continues every day. This is chronic stress and can put you at risk for heart problems like heart disease and depression. Understand what's happening inside your body and learn simple coping skills to combat the negative impacts of everyday stressors.  Types of Stress There are two types of stress: Emotional - types of emotional stress are relationship problems, pressure at work, financial worries, experiencing discrimination or having a major life change. Physical - Examples of physical stress include being sick having pain, not sleeping well, recovery from an injury or having an alcohol and drug use disorder. Fight or Flight Sudden or ongoing stress activates your nervous system and floods your bloodstream with adrenaline and cortisol, two hormones that raise blood pressure, increase heart rate and spike blood sugar. These changes pitch your body into a fight or  flight response. That enabled our ancestors to outrun saber-toothed tigers, and it's helpful today for situations like dodging a car accident. But most modern chronic stressors, such as finances or a challenging relationship, keep your body in that heightened state, which hurts your health. Effects of Too Much Stress If constantly under stress, most of [place] will eventually start to function less well.  Multiple studies link chronic stress to a higher risk of heart disease, stroke, depression, weight gain, memory loss and even premature death, so it's important to recognize the warning signals. Talk to your doctor about ways to manage stress if you're experiencing any of these symptoms: Prolonged periods of poor sleep. Regular, severe headaches. Unexplained weight loss or gain. Feelings of isolation, withdrawal or worthlessness. Constant anger and irritability. Loss of interest in activities. Constant worrying or obsessive thinking. Excessive alcohol or drug use. Inability to concentrate.  10 Ways to Cope with Chronic Stress It's key to recognize stressful situations as they occur because it allows you to focus on managing how you react. We all need to know when to close our eyes and take a deep breath when we feel tension rising. Use these tips to prevent or reduce chronic stress. 1. Rebalance Work and Home All work and no play? If you're spending too much time at the office, intentionally  put more dates in your calendar to enjoy time for fun, either alone or with others. 2. Get Regular Exercise Moving your body on a regular basis balances the nervous system and increases blood circulation, helping to flush out stress hormones. Even a daily 20-minute walk makes a difference. Any kind of exercise can lower stress and improve your mood ? just pick activities that you enjoy and make it a regular habit. 3. Eat Well and Limit Alcohol and Stimulants Alcohol, nicotine and caffeine may temporarily  relieve stress but have negative health impacts and can make stress worse in the long run. Well-nourished bodies cope better, so start with a good breakfast, add more organic fruits and vegetables for a well-balanced diet, avoid processed foods and sugar, try herbal tea and drink more water. 4. Connect with Supportive People Talking face to face with another person releases hormones that reduce stress. Lean on those good listeners in your life. 5. Carve Out Hobby Time Do you enjoy gardening, reading, listening to music or some other creative pursuit? Engage in activities that bring you pleasure and joy; research shows that reduces stress by almost half and lowers your heart rate, too. 6. Practice Meditation, Stress Reduction or Yoga Relaxation techniques activate a state of restfulness that counterbalances your body's fight-or-flight hormones. Even if this also means a 10-minute break in a long day: listen to music, read, go for a walk in nature, do a hobby, take a bath or spend time with a friend. Also consider doing a mindfulness exercise or try a daily deep breathing or imagery practice. Deep Breathing Slow, calm and deep breathing can help you relax. Try these steps to focus on your breathing and repeat as needed. Find a comfortable position and close your eyes. Exhale and drop your shoulders. Breathe in through your nose; fill your lungs and then your belly. Think of relaxing your body, quieting your mind and becoming calm and peaceful. Breathe out slowly through your nose, relaxing your belly. Think of releasing tension, pain, worries or distress. Repeat steps three and four until you feel relaxed. Imagery This involves using your mind to excite the senses -- sound, vision, smell, taste and feeling. This may help ease your stress. Begin by getting comfortable and then do some slow breathing. Imagine a place you love being at. It could be somewhere from your childhood, somewhere you vacationed  or just a place in your imagination. Feel how it is to be in the place you're imagining. Pay attention to the sounds, air, colors, and who is there with you. This is a place where you feel cared for and loved. All is well. You are safe. Take in all the smells, sounds, tastes and feelings. As you do, feel your body being nourished and healed. Feel the calm that surrounds you. Breathe in all the good. Breathe out any discomfort or tension. 7. Sleep Enough If you get less than seven to eight hours of sleep, your body won't tolerate stress as well as it could. If stress keeps you up at night, address the cause, and add extra meditation into your day to make up for the lost z's. Try to get seven to nine hours of sleep each night. Make a regular bedtime schedule. Keep your room dark and cool. Try to avoid computers, TV, cell phones and tablets before bed. 8. Bond with Connections You Enjoy Go out for a coffee with a friend, chat with a neighbor, call a family member, visit with a clergy  member, or even hang out with your pet. Clinical studies show that spending even a short time with a companion animal can cut anxiety levels almost in half. 9. Take a Vacation Getting away from it all can reset your stress tolerance by increasing your mental and emotional outlook, which makes you a happier, more productive person upon return. Leave your cellphone and laptop at home! 10. See a Counselor, Coach or Therapist If negative thoughts overwhelm your ability to make positive changes, it's time to seek professional help. Make an appointment today--your health and life are worth it."     24- Hour Availability:    [facility]  [address] [place], [place] [phone] Crisis [phone]   [facility] [phone]   [place] [facility]  [phone]    [facility]  [phone] (after hours)   Therapeutic  Alternative/Mobile Crisis   [phone]   [place] National Suicide Hotline  ([phone] Len Childs) [place] [address]   Call 911 or go to emergency room   [facility]  [phone]);  [place] and [employer]  [phone]); [place], [place], [place], [place], [place], [place], [place]         Follow up goal      Follow up:  Patient agrees to Care Plan and Follow-up.  Plan: The Managed Medicaid care management team will reach out to the patient again over the next 30 days.  Dickie La, [name], MSW, [employer] Managed Medicaid LCSW [facility]  [facility] [email or web address] Phone: [phone]

## 2023-03-04 NOTE — Patient Instructions (Signed)
Visit Information  Chris [name] was given information about Medicaid Managed Care team care coordination services as a part of their Healthy [facility] Medicaid benefit. Chris [name] verbally consented to engagement with the [facility] Managed Care team.   If you are experiencing a medical emergency, please call 911 or report to your local emergency department or urgent care.   If you have a non-emergency medical problem during routine business hours, please contact your provider's office and ask to speak with a nurse.   For questions related to your Healthy [facility] health plan, please call: [phone] or visit the homepage here: [email or web address]  If you would like to schedule transportation through your Healthy [facility] plan, please call the following number at least 2 days in advance of your appointment: [phone]  For information about your ride after you set it up, call Ride Assist at [phone]. Use this number to activate a Will Call pickup, or if your transportation is late for a scheduled pickup. Use this number, too, if you need to make a change or cancel a previously scheduled reservation.  If you need transportation services right away, call [phone]. The after-hours call center is staffed 24 hours to handle ride assistance and urgent reservation requests (including discharges) 365 days a year. Urgent trips include sick visits, hospital discharge requests and life-sustaining treatment.  Call the [facility] Line at [phone], at any time, 24 hours a day, 7 days a week. If you are in danger or need immediate medical attention call 911.  If you would like help to quit smoking, call 1-800-QUIT-NOW ((579)076-6959) OR Espaol: 1-855-Djelo-Ya (8-756-433-2951) o para ms informacin haga clic aqu or Text READY to 884-166 to register via text  Following is a copy of your plan of care:  Care Plan : General Social Work  (Adult)  Updates made by Gustavus Bryant, LCSW since 03/04/2023 12:00 AM     Problem: Depression Identification (Depression)      Long-Range Goal: To increase my self care and gain mental health support   Start Date: 07/08/2022  Priority: High  Note:   Timeframe:  Long-Range Goal Priority:  High Start Date:   07/08/22                    Expected End Date:ongoing    Follow Up Date- 03/30/23 at 9:15 am   - begin personal counseling - call and visit an old friend - check out volunteer opportunities - join a support group - laugh; watch a funny movie or comedian - learn and use visualization or guided imagery - perform a random act of kindness - practice relaxation or meditation daily - start or continue a personal journal - talk about feelings with a friend, family or spiritual advisor - practice positive thinking and self-talk    Why is this important?   When you are stressed, down or upset, your body reacts too.  For example, your blood pressure may get higher; you may have a headache or stomachache.  When your emotions get the best of you, your body's ability to fight off cold and flu gets weak.  These steps will help you manage your emotions.   Current Barriers:  Chronic Mental Health needs related to depression and need for housing Limited social support, Housing barriers, and Mental Health Concerns  Social Isolation ADL IADL limitations Suicidal Ideation/Homicidal Ideation: No  Clinical Social Work Goal(s):  Over the next 120 days, patient will work with LCSW monthly by  telephone or in person to reduce or manage symptoms related to depression and relapse prevention patient will work with [name] to address needs related to financial, food and transportation support. Patient wishes to offset medical and housing expenses with financial support as housing resources are very limited at this time  Types of Stress There are two types of stress: Emotional - types of emotional  stress are relationship problems, pressure at work, financial worries, experiencing discrimination or having a major life change. Physical - Examples of physical stress include being sick having pain, not sleeping well, recovery from an injury or having an alcohol and drug use disorder. Fight or Flight Sudden or ongoing stress activates your nervous system and floods your bloodstream with adrenaline and cortisol, two hormones that raise blood pressure, increase heart rate and spike blood sugar. These changes pitch your body into a fight or flight response. That enabled our ancestors to outrun saber-toothed tigers, and it's helpful today for situations like dodging a car accident. But most modern chronic stressors, such as finances or a challenging relationship, keep your body in that heightened state, which hurts your health. Effects of Too Much Stress If constantly under stress, most of [place] will eventually start to function less well.  Multiple studies link chronic stress to a higher risk of heart disease, stroke, depression, weight gain, memory loss and even premature death, so it's important to recognize the warning signals. Talk to your doctor about ways to manage stress if you're experiencing any of these symptoms: Prolonged periods of poor sleep. Regular, severe headaches. Unexplained weight loss or gain. Feelings of isolation, withdrawal or worthlessness. Constant anger and irritability. Loss of interest in activities. Constant worrying or obsessive thinking. Excessive alcohol or drug use. Inability to concentrate.  10 Ways to Cope with Chronic Stress It's key to recognize stressful situations as they occur because it allows you to focus on managing how you react. We all need to know when to close our eyes and take a deep breath when we feel tension rising. Use these tips to prevent or reduce chronic stress. 1. Rebalance Work and Home All work and no play? If you're spending too much time  at the office, intentionally put more dates in your calendar to enjoy time for fun, either alone or with others. 2. Get Regular Exercise Moving your body on a regular basis balances the nervous system and increases blood circulation, helping to flush out stress hormones. Even a daily 20-minute walk makes a difference. Any kind of exercise can lower stress and improve your mood ? just pick activities that you enjoy and make it a regular habit. 3. Eat Well and Limit Alcohol and Stimulants Alcohol, nicotine and caffeine may temporarily relieve stress but have negative health impacts and can make stress worse in the long run. Well-nourished bodies cope better, so start with a good breakfast, add more organic fruits and vegetables for a well-balanced diet, avoid processed foods and sugar, try herbal tea and drink more water. 4. Connect with Supportive People Talking face to face with another person releases hormones that reduce stress. Lean on those good listeners in your life. 5. Carve Out Hobby Time Do you enjoy gardening, reading, listening to music or some other creative pursuit? Engage in activities that bring you pleasure and joy; research shows that reduces stress by almost half and lowers your heart rate, too. 6. Practice Meditation, Stress Reduction or Yoga Relaxation techniques activate a state of restfulness that counterbalances your body's fight-or-flight hormones.  Even if this also means a 10-minute break in a long day: listen to music, read, go for a walk in nature, do a hobby, take a bath or spend time with a friend. Also consider doing a mindfulness exercise or try a daily deep breathing or imagery practice. Deep Breathing Slow, calm and deep breathing can help you relax. Try these steps to focus on your breathing and repeat as needed. Find a comfortable position and close your eyes. Exhale and drop your shoulders. Breathe in through your nose; fill your lungs and then your belly. Think of  relaxing your body, quieting your mind and becoming calm and peaceful. Breathe out slowly through your nose, relaxing your belly. Think of releasing tension, pain, worries or distress. Repeat steps three and four until you feel relaxed. Imagery This involves using your mind to excite the senses -- sound, vision, smell, taste and feeling. This may help ease your stress. Begin by getting comfortable and then do some slow breathing. Imagine a place you love being at. It could be somewhere from your childhood, somewhere you vacationed or just a place in your imagination. Feel how it is to be in the place you're imagining. Pay attention to the sounds, air, colors, and who is there with you. This is a place where you feel cared for and loved. All is well. You are safe. Take in all the smells, sounds, tastes and feelings. As you do, feel your body being nourished and healed. Feel the calm that surrounds you. Breathe in all the good. Breathe out any discomfort or tension. 7. Sleep Enough If you get less than seven to eight hours of sleep, your body won't tolerate stress as well as it could. If stress keeps you up at night, address the cause, and add extra meditation into your day to make up for the lost z's. Try to get seven to nine hours of sleep each night. Make a regular bedtime schedule. Keep your room dark and cool. Try to avoid computers, TV, cell phones and tablets before bed. 8. Bond with Connections You Enjoy Go out for a coffee with a friend, chat with a neighbor, call a family member, visit with a clergy member, or even hang out with your pet. Clinical studies show that spending even a short time with a companion animal can cut anxiety levels almost in half. 9. Take a Vacation Getting away from it all can reset your stress tolerance by increasing your mental and emotional outlook, which makes you a happier, more productive person upon return. Leave your cellphone and laptop at home! 10. See a  Counselor, Coach or Therapist If negative thoughts overwhelm your ability to make positive changes, it's time to seek professional help. Make an appointment today--your health and life are worth it."     24- Hour Availability:    [facility]  [address] [place], [place] [phone] Crisis [phone]   [facility] [phone]   [place] [facility]  [phone]    [facility]  [phone] (after hours)   Therapeutic Alternative/Mobile Crisis   [phone]   [place] National Suicide Hotline  7574859527 Len Childs) [place] [address]   Call 911 or go to emergency room   [facility]  [phone]);  [place] and [employer]  [phone]); [place], [place], [place], [place], [place], [place], [place]         Follow up goal  [employer],  [name], MSW, [employer] Managed Medicaid LCSW [place]  [facility] [email or web address] Phone: [phone]

## 2023-03-09 ENCOUNTER — Other Ambulatory Visit: Payer: Self-pay

## 2023-03-09 ENCOUNTER — Other Ambulatory Visit: Payer: Self-pay | Admitting: Family Medicine

## 2023-03-09 ENCOUNTER — Other Ambulatory Visit: Payer: Medicaid Other | Admitting: *Deleted

## 2023-03-09 MED ORDER — ACCU-CHEK AVIVA PLUS W/DEVICE KIT
1.0000 | PACK | Freq: Three times a day (TID) | 0 refills | Status: DC
  Filled 2023-03-09: qty 1, fill #0

## 2023-03-09 MED ORDER — ACCU-CHEK AVIVA PLUS W/DEVICE KIT: 1.0000 | PACK | Freq: Three times a day (TID) | 0 refills | Status: DC

## 2023-03-09 NOTE — Patient Instructions (Addendum)
Visit Information  Mr. Kohlmeyer was given information about Medicaid Managed Care team care coordination services as a part of their Healthy [facility] Medicaid benefit. DEKLAN ROZELL verbally consented to engagement with the [facility] Managed Care team.   If you are experiencing a medical emergency, please call 911 or report to your local emergency department or urgent care.   If you have a non-emergency medical problem during routine business hours, please contact your provider's office and ask to speak with a nurse.   For questions related to your Healthy [facility] health plan, please call: [phone] or visit the homepage here: [email or web address]  If you would like to schedule transportation through your Healthy [facility] plan, please call the following number at least 2 days in advance of your appointment: [phone]  For information about your ride after you set it up, call Ride Assist at [phone]. Use this number to activate a Will Call pickup, or if your transportation is late for a scheduled pickup. Use this number, too, if you need to make a change or cancel a previously scheduled reservation.  If you need transportation services right away, call [phone]. The after-hours call center is staffed 24 hours to handle ride assistance and urgent reservation requests (including discharges) 365 days a year. Urgent trips include sick visits, hospital discharge requests and life-sustaining treatment.  Call the [facility] Line at [phone], at any time, 24 hours a day, 7 days a week. If you are in danger or need immediate medical attention call 911.  If you would like help to quit smoking, call 1-800-QUIT-NOW ((760)326-5278) OR Espaol: 1-855-Djelo-Ya (5-462-703-5009) o para ms informacin haga clic aqu or Text READY to 381-829 to register via text  Mr. Sosnoski,   Please see education materials related to gout provided as  print materials.   Patient verbalizes understanding of instructions and care plan provided today and agrees to view in MyChart. Active MyChart status and patient understanding of how to access instructions and care plan via MyChart confirmed with patient.     Telephone follow up appointment with Managed Medicaid care management team member scheduled for:04/08/23 at 10:30 am  Estanislado Emms RN, BSN [place]  [facility] RN Care Coordinator [phone]   Following is a copy of your plan of care:  Care Plan : RN Care Manager Plan of Care  Updates made by [name], RN since 03/09/2023 12:00 AM     Problem: Health Management Needs Related to Heart Failure and Chronic Pain   Priority: High  Note:   Current Barriers:    RNCM Clinical Goal(s):  Patient will take all medications exactly as prescribed and will call provider for medication related questions as evidenced by medication review and patient interview demonstrate Ongoing adherence to prescribed treatment plan for CHF as evidenced by medication review continue to work with RN Care Manager to address care management and care coordination needs related to  CHF as evidenced by adherence to CM Team Scheduled appointments work with social worker to address  related to the management of Limited social support, Transportation, and Lack of essential utilities - light/power* related to the management of CHF as evidenced by review of EMR and patient or Child psychotherapist report through collaboration with Medical illustrator, provider, and care team.   Interventions: Collaboration with LCSW/[name]/Care Guide and Heart Failure RN Inter-disciplinary care team collaboration (see longitudinal plan of care) Evaluation of current treatment plan related to  self management and patient's adherence  to plan as established by provider   Heart Failure Interventions:  (Status:  New goal.) Long Term Goal Discussed importance  of daily weight and advised patient to weigh and record daily Reviewed role of diuretics in prevention of fluid overload and management of heart failure; Discussed the importance of keeping all appointments with provider Assessed social determinant of health barriers  Discussed importance of self assessment reporting of swelling, increased shortness of breath, chest pain, dizziness, or other new or worsened symptom  Pain Interventions:  (Status:  New goal.) Short Term Goal Pain assessment performed Medications reviewed Reviewed provider established plan for pain management Discussed importance of adherence to all scheduled medical appointments Counseled on the importance of reporting any/all new or changed pain symptoms or management strategies to pain management provider Reviewed with patient prescribed pharmacological and nonpharmacological pain relief strategies Assessed social determinant of health barriers Advised patient that the CM team would discuss pain management needs with provider and assist with connection to pain management provider  Patient Goals/Self-Care Activities: Take all medications as prescribed Attend all scheduled provider appointments Call provider office for new concerns or questions  Work with the social worker to address care coordination needs and will continue to work with the clinical team to address health care and disease management related needs call 1-800-273-TALK (toll free, 24 hour hotline) if experiencing a Mental Health or Behavioral Health Crisis   Follow Up Plan:  Telephone follow up appointment with care management team member scheduled for:  07/27/22 10am      Long-Range Goal: Independent self health management of Heart Failure and Chronic Pain   Start Date: 07/23/2022  Expected End Date: 05/13/2023  Recent Progress: On track  Priority: High  Note:   Current Barriers:  Knowledge Deficits related to plan of care for management of CHF and Chronic  Pain Care Coordination needs related to Transportation  Non-adherence to prescribed medication regimen Difficulty obtaining medications  RNCM Clinical Goal(s):  Patient will take all medications exactly as prescribed and will call provider for medication related questions as evidenced by medication review and patient interview demonstrate Ongoing adherence to prescribed treatment plan for CHF as evidenced by medication review continue to work with RN Care Manager to address care management and care coordination needs related to  CHF as evidenced by adherence to CM Team Scheduled appointments work with Child psychotherapist to address  related to the management of Limited social support, Transportation, and Lack of essential utilities - light/power* related to the management of CHF as evidenced by review of EMR and patient or Child psychotherapist report through collaboration with Medical illustrator, provider, and care team.   Interventions: Inter-disciplinary care team collaboration (see longitudinal plan of care) Evaluation of current treatment plan related to  self management and patient's adherence to plan as established by provider Advised patient to contact [facility] [phone] for needed refills and request delivery if needed Reviewed upcoming appointments including: 03/28/23 with Pulmonolgy, 04/12/23 with [facility] and 04/28/23 with Dr. Laural Benes Advised patient to take all medications with him to upcoming provider appointments Medication reviewed, patient needing allopurinol-prescription sent to [facility] in July and has not been picked up Collaborated with [facility] for Allopurinol prescription-patient will pick up tomorrow Ensured patient will arrange transportation to upcoming appointments   COPD Interventions:  (Status:  Goal on track:  Yes.) Long Term Goal Advised patient to track and manage COPD triggers Advised patient to engage in light exercise as tolerated 3-5 days a  week to  aid in the the management of COPD Provided education about and advised patient to utilize infection prevention strategies to reduce risk of respiratory infection Discussed the importance of adequate rest and management of fatigue with COPD Assessed social determinant of health barriers Advised patient on smoking cessation, provided education Congratulated patient on cutting back on smoking, plan to work on smoking cessation goal during next visit Advised patient to attend upcoming Pulmonology appointment on 03/28/23    Heart Failure Interventions:  (Status: goal Met) Long Term Goal Discussed importance of daily weight and advised patient to weigh and record daily Reviewed role of diuretics in prevention of fluid overload and management of heart failure; Discussed the importance of keeping all appointments with provider  Discussed importance of self assessment reporting of swelling, increased shortness of breath, chest pain, dizziness, or other new or worsened symptom Reviewed upcoming appointments and ensured transportation arrangements have been made  Pain Interventions:  (Status:  Goal Met) Long Term Goal Pain assessment performed Medications reviewed: advised patient to take all medications to new visit with [facility] Reviewed provider established plan for pain management Discussed importance of adherence to all scheduled medical appointments Counseled on the importance of reporting any/all new or changed pain symptoms or management strategies to pain management provider Reviewed with patient prescribed pharmacological and nonpharmacological pain relief strategies Assessed social determinant of health barriers  Patient Goals/Self-Care Activities: Take all medications as prescribed Attend all scheduled provider appointments Call provider office for new concerns or questions  Work with the social worker to address care coordination needs and will  continue to work with the clinical team to address health care and disease management related needs call 1-800-273-TALK (toll free, 24 hour hotline) if experiencing a Mental Health or Behavioral Health Crisis   Follow Up Plan:  Telephone follow up appointment with care management team member scheduled for:  04/08/23 @ 10:30 am

## 2023-03-09 NOTE — Patient Outreach (Signed)
Medicaid Managed Care   Nurse Care Manager Note  03/09/2023 Name:  Chris [name] MRN:  [id number] DOB:  09-02-1960  Chris [name] is an 62 y.o. year old male who is a primary patient of Chris Matar, MD.  The [facility] Managed Care Coordination team was consulted for assistance with:    CHF COPD Pain  [name] was given information about Medicaid Managed Care Coordination team services today. Chris [name] Patient agreed to services and verbal consent obtained.  Engaged with patient by telephone for follow up visit in response to provider referral for case management and/or care coordination services.   Assessments/Interventions:  Review of past medical history, allergies, medications, health status, including review of consultants reports, laboratory and other test data, was performed as part of comprehensive evaluation and provision of chronic care management services.  SDOH (Social Determinants of Health) assessments and interventions performed: SDOH Interventions    Flowsheet Row Patient Outreach Telephone from 03/09/2023 in [place] POPULATION HEALTH DEPARTMENT Patient Outreach Telephone from 03/04/2023 in [place] POPULATION HEALTH DEPARTMENT Patient Outreach Telephone from 01/17/2023 in [place] POPULATION HEALTH DEPARTMENT Telephone from 12/23/2022 in [place] POPULATION HEALTH DEPARTMENT Patient Outreach Telephone from 12/17/2022 in [place] POPULATION HEALTH DEPARTMENT Patient Outreach Telephone from 11/10/2022 in  POPULATION HEALTH DEPARTMENT  SDOH Interventions        Food Insecurity Interventions Intervention Not Indicated -- -- -- -- --  Housing Interventions Intervention Not Indicated -- -- -- -- --  Transportation Interventions -- -- -- Payor Benefit -- --  Financial Strain Interventions -- Other (Comment)  Chris [name] [name] involved and has a F/U appt] -- -- -- --  Stress Interventions -- [employer], Provide Counseling Offered  [employer], Provide Counseling -- Offered [employer], Provide Counseling Offered [employer], Provide Counseling  [I am feeling better since I seen my doctor]       Care Plan  No Known Allergies  Medications Reviewed Today     Reviewed by Chris Dach, RN (Registered Nurse) on 03/09/23 at 0945  Med List Status: <None>   Medication Order Taking? Sig Documenting Provider Last Dose Status Informant  Accu-Chek Softclix Lancets lancets [id number] Yes Use as instructed Chris Matar, MD Taking Active Self  acetaminophen (TYLENOL) 500 MG tablet [id number] Yes Take 1-2 tablets (500-1,000 mg total) by mouth every 6 (six) hours as needed (pain.). Chris Munch, MD Taking Active   albuterol (PROVENTIL) (2.5 MG/3ML) 0.083% nebulizer solution [id number]  Take 3 mLs (2.5 mg total) by nebulization every 4 (four) hours as needed for wheezing or shortness of breath. Chris Simmonds, PA-C  Expired 02/04/23 2359   albuterol (VENTOLIN HFA) 108 (90 Base) MCG/ACT inhaler [id number] Yes Inhale 2 puffs into the lungs every 6 (six) hours as needed for wheezing or shortness of breath. Chris Matar, MD Taking Active   allopurinol (ZYLOPRIM) 100 MG tablet [id number] No TAKE 2 TABLETS (200 MG TOTAL) BY MOUTH DAILY.  Patient not taking: Reported on 03/09/2023   Chris Simmonds, PA-C Not Taking Active            Med Note (Chris [name] A   Wed Mar 09, 2023  9:19 AM) Needs refill  apixaban (ELIQUIS) 5 MG TABS tablet [id number] Yes Take 1 tablet (5 mg total) by mouth 2 (two) times daily. Chris Morale, MD Taking Active   atorvastatin (LIPITOR) 80 MG tablet [id number] Yes Take 1 tablet (80 mg total)  by mouth daily. Chris Morale, MD Taking Active   baclofen (LIORESAL) 10 MG tablet [id number] No Take 1 tablet (10 mg total) by mouth 3 (three) times daily.  Patient not taking: Reported on 03/09/2023   Chris Captain, PA-C Not Taking Active    budesonide-formoterol [employer] [facility]) 160-4.5 MCG/ACT inhaler [id number] Yes Inhale 2 puffs into the lungs 2 (two) times daily. Chris Matar, MD Taking Active   carvedilol (COREG) 12.5 MG tablet [id number] Yes Take 1 tablet (12.5 mg total) by mouth 2 (two) times daily. Chris Morale, MD Taking Active   colchicine 0.6 MG tablet [id number] Yes SMARTSIG:0.5 Tablet(s) By Mouth 3 Times a Week [provider] Taking Active   dapagliflozin propanediol (FARXIGA) 10 MG TABS tablet [id number] Yes Take 1 tablet (10 mg total) by mouth daily. Chris Matar, MD Taking Active   hydrOXYzine (ATARAX) 25 MG tablet [id number] Yes Take 1 tablet 3 times a day as needed for anxiety. Take 2 tablets at night as need for sleep Chris Pope, MD Taking Active   isosorbide-hydrALAZINE (BIDIL) 20-37.5 MG tablet [id number] Yes Take 1 tablet by mouth 3 (three) times daily. Chris Odea, MD Taking Active Self    Discontinued 07/11/20 [phone] (Discontinued by provider)   mirtazapine (REMERON) 15 MG tablet [id number] Yes Take 1 tablet (15 mg total) by mouth at bedtime. Chris Pope, MD Taking Active   potassium chloride (KLOR-CON M) 10 MEQ tablet [id number] Yes Take 2 tablets (20 mEq total) by mouth daily. Chris Morale, MD Taking Active   Tiotropium Bromide Monohydrate (SPIRIVA RESPIMAT) 2.5 MCG/ACT AERS [id number] Yes Inhale 2 puffs into the lungs daily. Chris Matar, MD Taking Active   torsemide 40 MG TABS [id number] Yes Take 20 mg daily, alternating with 40 mg daily. Chris Munch, MD Taking Active   traMADol Janean Sark) 50 MG tablet [id number] No Take 1 tablet (50 mg total) by mouth every 6 (six) hours as needed.  Patient not taking: Reported on 03/09/2023   Chris Captain, PA-C Not Taking Active             Patient Active Problem List   Diagnosis Date Noted   Post traumatic stress disorder 09/23/2022   MDD (major depressive disorder), recurrent episode, moderate (HCC) 08/26/2022   Stage 3b chronic  kidney disease (HCC) 08/09/2022   Hypertrophic cardiomyopathy (HCC) 08/09/2022   History of renal cell cancer 08/09/2022   Chronic heart failure with preserved ejection fraction (HFpEF) (HCC) 07/13/2022   Agitation 04/23/2022   RSV (respiratory syncytial virus pneumonia) 04/22/2022   Obesity (BMI 30-39.9) 04/21/2022   COPD exacerbation (HCC) 04/19/2022   Dyslipidemia 04/19/2022   Gout 04/19/2022   Right kidney mass 05/14/2021   CHF exacerbation (HCC) 04/14/2021   Chest pain 04/14/2021   Syncope 04/14/2021   Left-sided weakness 10/28/2020   Typical atrial flutter (HCC)    CHF (congestive heart failure) (HCC) 07/04/2020   Acute exacerbation of CHF (congestive heart failure) (HCC) 06/16/2020   Influenza vaccine refused 05/06/2020   Acute decompensated heart failure (HCC) 05/04/2020   Illiteracy 05/04/2020   Type 2 diabetes mellitus with stage 3 chronic kidney disease (HCC) 12/25/2019   Elevated troponin I level 10/26/2019   History of gout 02/01/2019   Seasonal allergic rhinitis due to pollen 02/01/2019   Nicotine use disorder 11/30/2018   Microscopic hematuria 11/30/2018   Depression 11/30/2018   Difficulty controlling anger 11/30/2018   COPD (chronic obstructive pulmonary disease) (HCC)    CKD (chronic kidney disease) stage 3, GFR 30-59  ml/min (HCC) 08/10/2018   Recurrent epistaxis 04/21/2018   Mixed hyperlipidemia 07/28/2017   Essential hypertension 07/28/2017   Chronic systolic heart failure (HCC) 10/25/2014   Cocaine abuse (HCC) 02/20/2013   Cannabis abuse 02/20/2013   Back pain, chronic 02/20/2013    Conditions to be addressed/monitored per PCP order:  CHF, COPD, and Pain  Care Plan : RN Care Manager Plan of Care  Updates made by Chris Dach, RN since 03/09/2023 12:00 AM     Problem: Health Management Needs Related to Heart Failure and Chronic Pain   Priority: High  Note:   Current Barriers:    RNCM Clinical Goal(s):  Patient will take all medications  exactly as prescribed and will call provider for medication related questions as evidenced by medication review and patient interview demonstrate Ongoing adherence to prescribed treatment plan for CHF as evidenced by medication review continue to work with RN Care Manager to address care management and care coordination needs related to  CHF as evidenced by adherence to CM Team Scheduled appointments work with Child psychotherapist to address  related to the management of Limited social support, Transportation, and Lack of essential utilities - light/power* related to the management of CHF as evidenced by review of EMR and patient or Child psychotherapist report through collaboration with Medical illustrator, provider, and care team.   Interventions: Collaboration with LCSW/[name]/Care Guide and Heart Failure RN Inter-disciplinary care team collaboration (see longitudinal plan of care) Evaluation of current treatment plan related to  self management and patient's adherence to plan as established by provider   Heart Failure Interventions:  (Status:  New goal.) Long Term Goal Discussed importance of daily weight and advised patient to weigh and record daily Reviewed role of diuretics in prevention of fluid overload and management of heart failure; Discussed the importance of keeping all appointments with provider Assessed social determinant of health barriers  Discussed importance of self assessment reporting of swelling, increased shortness of breath, chest pain, dizziness, or other new or worsened symptom  Pain Interventions:  (Status:  New goal.) Short Term Goal Pain assessment performed Medications reviewed Reviewed provider established plan for pain management Discussed importance of adherence to all scheduled medical appointments Counseled on the importance of reporting any/all new or changed pain symptoms or management strategies to pain management provider Reviewed with patient prescribed pharmacological and  nonpharmacological pain relief strategies Assessed social determinant of health barriers Advised patient that the CM team would discuss pain management needs with provider and assist with connection to pain management provider  Patient Goals/Self-Care Activities: Take all medications as prescribed Attend all scheduled provider appointments Call provider office for new concerns or questions  Work with the social worker to address care coordination needs and will continue to work with the clinical team to address health care and disease management related needs call 1-800-273-TALK (toll free, 24 hour hotline) if experiencing a Mental Health or Behavioral Health Crisis   Follow Up Plan:  Telephone follow up appointment with care management team member scheduled for:  07/27/22 10am      Long-Range Goal: Independent self health management of Heart Failure and Chronic Pain   Start Date: 07/23/2022  Expected End Date: 05/13/2023  Recent Progress: On track  Priority: High  Note:   Current Barriers:  Knowledge Deficits related to plan of care for management of CHF and Chronic Pain Care Coordination needs related to Transportation  Non-adherence to prescribed medication regimen Difficulty obtaining medications  RNCM Clinical Goal(s):  Patient will  take all medications exactly as prescribed and will call provider for medication related questions as evidenced by medication review and patient interview demonstrate Ongoing adherence to prescribed treatment plan for CHF as evidenced by medication review continue to work with RN Care Manager to address care management and care coordination needs related to  CHF as evidenced by adherence to CM Team Scheduled appointments work with Child psychotherapist to address  related to the management of Limited social support, Transportation, and Lack of essential utilities - light/power* related to the management of CHF as evidenced by review of EMR and patient or Arts development officer report through collaboration with Medical illustrator, provider, and care team.   Interventions: Inter-disciplinary care team collaboration (see longitudinal plan of care) Evaluation of current treatment plan related to  self management and patient's adherence to plan as established by provider Advised patient to contact [facility] [phone] for needed refills and request delivery if needed Reviewed upcoming appointments including: 03/28/23 with Pulmonolgy, 04/12/23 with [facility] and 04/28/23 with Dr. Laural Benes Advised patient to take all medications with him to upcoming provider appointments Medication reviewed, patient needing allopurinol-prescription sent to [facility] in July and has not been picked up Collaborated with [facility] for Allopurinol prescription-patient will pick up tomorrow Ensured patient will arrange transportation to upcoming appointments   COPD Interventions:  (Status:  Goal on track:  Yes.) Long Term Goal Advised patient to track and manage COPD triggers Advised patient to engage in light exercise as tolerated 3-5 days a week to aid in the the management of COPD Provided education about and advised patient to utilize infection prevention strategies to reduce risk of respiratory infection Discussed the importance of adequate rest and management of fatigue with COPD Assessed social determinant of health barriers Advised patient on smoking cessation, provided education Congratulated patient on cutting back on smoking, plan to work on smoking cessation goal during next visit Advised patient to attend upcoming Pulmonology appointment on 03/28/23    Heart Failure Interventions:  (Status: goal Met) Long Term Goal Discussed importance of daily weight and advised patient to weigh and record daily Reviewed role of diuretics in prevention of fluid overload and management of heart failure; Discussed the importance of keeping all appointments with  provider  Discussed importance of self assessment reporting of swelling, increased shortness of breath, chest pain, dizziness, or other new or worsened symptom Reviewed upcoming appointments and ensured transportation arrangements have been made  Pain Interventions:  (Status:  Goal Met) Long Term Goal Pain assessment performed Medications reviewed: advised patient to take all medications to new visit with [facility] Management Reviewed provider established plan for pain management Discussed importance of adherence to all scheduled medical appointments Counseled on the importance of reporting any/all new or changed pain symptoms or management strategies to pain management provider Reviewed with patient prescribed pharmacological and nonpharmacological pain relief strategies Assessed social determinant of health barriers  Patient Goals/Self-Care Activities: Take all medications as prescribed Attend all scheduled provider appointments Call provider office for new concerns or questions  Work with the social worker to address care coordination needs and will continue to work with the clinical team to address health care and disease management related needs call 1-800-273-TALK (toll free, 24 hour hotline) if experiencing a Mental Health or Behavioral Health Crisis   Follow Up Plan:  Telephone follow up appointment with care management team member scheduled for:  04/08/23 @ 10:30 am      Follow Up:  Patient agrees  to Care Plan and Follow-up.  Plan: The Managed Medicaid care management team will reach out to the patient again over the next 30 days.  Date/time of next scheduled RN care management/care coordination outreach:  04/08/23 at 10:30am  Estanislado Emms RN, BSN [place]  [facility] Health RN Care Coordinator [phone]

## 2023-03-15 ENCOUNTER — Ambulatory Visit (INDEPENDENT_AMBULATORY_CARE_PROVIDER_SITE_OTHER): Payer: Medicaid Other | Admitting: Mental Health

## 2023-03-15 DIAGNOSIS — F331 Major depressive disorder, recurrent, moderate: Secondary | ICD-10-CM | POA: Diagnosis not present

## 2023-03-15 DIAGNOSIS — F4310 Post-traumatic stress disorder, unspecified: Secondary | ICD-10-CM

## 2023-03-15 NOTE — Progress Notes (Signed)
THERAPIST PROGRESS NOTE  Session Time: 10:10 am ( 40 minutes)  Participation Level: Minimal  Behavioral Response: CasualAlertStable  Type of Therapy: Individual Therapy  Treatment Goals addressed: STG: "My anger" Gladys will decrease sxs of depression/irritability AEB development of x 3 emotional regulation skills with ability to process emotions in healthy manner per self report within the next 6 months   Active     OP Depression     LTG: Nidal will score less than 5 on the Patient Health Questionnaire (PHQ-9)  (Progressing)     Start:  12/07/22    Expected End:  06/07/23         STG: "My anger" Reinhold will decrease sxs of depression/irritability AEB development of x 3 emotional regulation skills with ability to process emotions in healthy manner per self report within the next 6 months (Progressing)     Start:  12/07/22    Expected End:  06/07/23       Goal Note     Update 03/15/2023: Demetri has done well working towards treatment goals. Reports decreased feelings of anger and ability to manage emotions. Shares ability to hold coping thoughts and use music as a self-soothe emotional regulation skills. Working to ongoing ly process emotions in healthy balanced manner.          Work with Genevie Cheshire to track symptoms, triggers, and/or skill use through a mood chart, diary card, or journal     Start:  12/07/22         Therapist will administer the PHQ-9 at weekly intervals for the next 16 weeks     Start:  12/07/22         Work with Genevie Cheshire to identify the major components of a recent episode of depression: physical symptoms, major thoughts and images, and major behaviors they experienced     Start:  12/07/22         Therapist will educate patient on cognitive distortions and the rationale for treatment of depression     Start:  12/07/22         [place] will identify  trauma related cognitive distortions     Start:  12/07/22         Justin will identify  cognitive  distortions they are currently using and write reframing statements to replace them     Start:  12/07/22         Therapist will review PLEASE Skills (Treat Physical Illness, Balance Eating, Avoid Mood-Altering Substances, Balance Sleep and Get Exercise) with patient     Start:  12/07/22             ProgressTowards Goals: Progressing  Interventions: CBT and Supportive  Summary:  CEPHUS TUPY is a 62 y.o. male who presents with dx of MDD severe and PTSD. Presents for therapy alert and oriented; mod and affect stable; adequate. Speech clear and coherent at normal rate and tone. Shares for feelings of depression to have reduced and shares feelings of being more calm. Shares difficult with sleep despite taking medications and agrees to follow up with medication provider at next appointment. Shares to have been able to engage and speak with his children and attend football game of his grand-son. Shares thoughts and feelings lin regards to ability to interact and development a relationship with loved ones. Shares hx of barriers of engagement and feelings of anger of others holding past events against him. Shares thoughts on forgiveness and working to focus on things in which are in  his control. Explores thoughts of ability to better channel and process feelings of anger and working through tough emotions. Shares to be doing well overall with no anger outburst. Attending church and spending time with children. Progress with goals noted; denies safety concerns.  Suicidal/Homicidal: Nowithout intent/plan  Therapist Response: Therapist engaged [place] in therapy session. Provided safe space to share thoughts and feelings in regards to current stressors and sxs of depression and PTSD. Explored current improvements in mood and areas of progress. Supported in processing thoughts and feelings in regards to engagement with children. Explored past barriers of engagement and presence of distorted thoughts that  caused difficulties with effective communication with others. Engaged in review of treatment plan and discussed progress. Updated and completed. Assessed for concerns for depression and anxiety and presence of PTSD sxs.Encouraged ongoing work to process thoughts in balanced manner and ability to reframe maladaptive thinking patterns that no longer serve him. Reviewed session and provided follow up.   Plan: Return again in  x 5 weeks.  Diagnosis: MDD (major depressive disorder), recurrent episode, moderate (HCC)  Post traumatic stress disorder  Collaboration of Care: Other none  Patient/Guardian was advised Release of Information must be obtained prior to any record release in order to collaborate their care with an outside provider. Patient/Guardian was advised if they have not already done so to contact the registration department to sign all necessary forms in order for [place] to release information regarding their care.   Consent: Patient/Guardian gives verbal consent for treatment and assignment of benefits for services provided during this visit. Patient/Guardian expressed understanding and agreed to proceed.   Stephan Minister Betances, [facility] 03/15/2023

## 2023-03-17 ENCOUNTER — Other Ambulatory Visit: Payer: Medicaid Other

## 2023-03-17 NOTE — Patient Instructions (Signed)
Visit Information  Chris [name] was given information about Medicaid Managed Care team care coordination services as a part of their Healthy [facility] Medicaid benefit. Chris [name] verbally consented to engagement with the [facility] Managed Care team.   If you are experiencing a medical emergency, please call 911 or report to your local emergency department or urgent care.   If you have a non-emergency medical problem during routine business hours, please contact your provider's office and ask to speak with a nurse.   For questions related to your Healthy [facility] health plan, please call: [phone] or visit the homepage here: [email or web address]  If you would like to schedule transportation through your Healthy [facility] plan, please call the following number at least 2 days in advance of your appointment: [phone]  For information about your ride after you set it up, call Ride Assist at [phone]. Use this number to activate a Will Call pickup, or if your transportation is late for a scheduled pickup. Use this number, too, if you need to make a change or cancel a previously scheduled reservation.  If you need transportation services right away, call [phone]. The after-hours call center is staffed 24 hours to handle ride assistance and urgent reservation requests (including discharges) 365 days a year. Urgent trips include sick visits, hospital discharge requests and life-sustaining treatment.  Call the [facility] Line at [phone], at any time, 24 hours a day, 7 days a week. If you are in danger or need immediate medical attention call 911.  If you would like help to quit smoking, call 1-800-QUIT-NOW (207-090-5180) OR Espaol: 1-855-Djelo-Ya (6-301-601-0932) o para ms informacin haga clic aqu or Text READY to 355-732 to register via text  [name] - following are the goals we discussed in your visit today:    Goals Addressed   None      Social Worker will follow up on 04/19/23.   Chris [name], Chris [name], MHA [facility] Health  Managed Medicaid Social Worker [phone]   Following is a copy of your plan of care:  There are no care plans that you recently modified to display for this patient.

## 2023-03-17 NOTE — Patient Outreach (Signed)
Medicaid Managed Care Social Work Note  03/17/2023 Name:  Chris [name] MRN:  [id number] DOB:  1961/02/19  Chris [name] is an 62 y.o. year old male who is a primary patient of [name], MD.  The Medicaid Managed Care Coordination team was consulted for assistance with:  Community Resources   Mr. Foth was given information about Medicaid Managed Care Coordination team services today. Chris [name] Patient agreed to services and verbal consent obtained.  Engaged with patient  for by telephone forfollow up visit in response to referral for case management and/or care coordination services.   Assessments/Interventions:  Review of past medical history, allergies, medications, health status, including review of consultants reports, laboratory and other test data, was performed as part of comprehensive evaluation and provision of chronic care management services.  SDOH: (Social Determinant of Health) assessments and interventions performed: SDOH Interventions    Flowsheet Row Patient Outreach Telephone from 03/09/2023 in [place] POPULATION [facility] DEPARTMENT Patient Outreach Telephone from 03/04/2023 in [place] POPULATION HEALTH DEPARTMENT Patient Outreach Telephone from 01/17/2023 in [place] POPULATION HEALTH DEPARTMENT Telephone from 12/23/2022 in [place] POPULATION HEALTH DEPARTMENT Patient Outreach Telephone from 12/17/2022 in [place] POPULATION HEALTH DEPARTMENT Patient Outreach Telephone from 11/10/2022 in [place] POPULATION HEALTH DEPARTMENT  SDOH Interventions        Food Insecurity Interventions Intervention Not Indicated -- -- -- -- --  Housing Interventions Intervention Not Indicated -- -- -- -- --  Transportation Interventions -- -- -- Payor Benefit -- --  Financial Strain Interventions -- Other (Comment)  [facility] [name] involved and has a F/U appt] -- -- -- --  Stress Interventions -- [employer], Provide Counseling Offered [employer], Provide Counseling -- Offered [employer], Provide Counseling Offered [employer] Resources, Provide Counseling  [I am feeling better since I seen my doctor]     [name] completed a telephone outreach with patient, he states he did not receieve the letter [name] sent him. [name] will resend resources and add transportation greiveance information. No other resources are needed at this time.  Advanced Directives Status:  Not addressed in this encounter.  Care Plan                 No Known Allergies  Medications Reviewed Today   Medications were not reviewed in this encounter     Patient Active Problem List   Diagnosis Date Noted   Post traumatic stress disorder 09/23/2022   MDD (major depressive disorder), recurrent episode, moderate (HCC) 08/26/2022   Stage 3b chronic kidney disease (HCC) 08/09/2022   Hypertrophic cardiomyopathy (HCC) 08/09/2022   History of renal cell cancer 08/09/2022   Chronic heart failure with preserved ejection fraction (HFpEF) (HCC) 07/13/2022   Agitation 04/23/2022   RSV (respiratory syncytial virus pneumonia) 04/22/2022   Obesity (BMI 30-39.9) 04/21/2022   COPD exacerbation (HCC) 04/19/2022   Dyslipidemia 04/19/2022   Gout 04/19/2022   Right kidney mass 05/14/2021   CHF exacerbation (HCC) 04/14/2021   Chest pain 04/14/2021   Syncope 04/14/2021   Left-sided weakness 10/28/2020   Typical atrial flutter (HCC)    CHF (congestive heart failure) (HCC) 07/04/2020   Acute exacerbation of CHF (congestive heart failure) (HCC) 06/16/2020   Influenza vaccine refused 05/06/2020   Acute decompensated heart failure (HCC) 05/04/2020   Illiteracy 05/04/2020   Type 2 diabetes mellitus with stage 3 chronic kidney disease (HCC) 12/25/2019   Elevated troponin I level 10/26/2019   History of  gout 02/01/2019   Seasonal allergic rhinitis due to pollen 02/01/2019   Nicotine use disorder 11/30/2018   Microscopic hematuria 11/30/2018    Depression 11/30/2018   Difficulty controlling anger 11/30/2018   COPD (chronic obstructive pulmonary disease) (HCC)    CKD (chronic kidney disease) stage 3, GFR 30-59 ml/min (HCC) 08/10/2018   Recurrent epistaxis 04/21/2018   Mixed hyperlipidemia 07/28/2017   Essential hypertension 07/28/2017   Chronic systolic heart failure (HCC) 10/25/2014   Cocaine abuse (HCC) 02/20/2013   Cannabis abuse 02/20/2013   Back pain, chronic 02/20/2013    Conditions to be addressed/monitored per PCP order:   community resources  There are no care plans that you recently modified to display for this patient.   Follow up:  Patient agrees to Care Plan and Follow-up.  Plan: The Managed Medicaid care management team will reach out to the patient again over the next 30 days.  Date/time of next scheduled Social Work care management/care coordination outreach:  04/19/23  [name], [name], [facility]  Managed [facility] Social Worker [phone]

## 2023-03-24 ENCOUNTER — Encounter (HOSPITAL_COMMUNITY): Payer: Self-pay | Admitting: Student

## 2023-03-24 ENCOUNTER — Telehealth (HOSPITAL_COMMUNITY): Payer: Medicaid Other | Admitting: Student

## 2023-03-24 DIAGNOSIS — F331 Major depressive disorder, recurrent, moderate: Secondary | ICD-10-CM

## 2023-03-24 DIAGNOSIS — F4310 Post-traumatic stress disorder, unspecified: Secondary | ICD-10-CM

## 2023-03-24 MED ORDER — MIRTAZAPINE 15 MG PO TABS
15.0000 mg | ORAL_TABLET | Freq: Every day | ORAL | 1 refills | Status: DC
Start: 2023-03-24 — End: 2023-05-17

## 2023-03-24 MED ORDER — HYDROXYZINE HCL 25 MG PO TABS
50.0000 mg | ORAL_TABLET | Freq: Every evening | ORAL | 2 refills | Status: DC | PRN
Start: 2023-03-24 — End: 2023-05-17

## 2023-03-24 NOTE — Progress Notes (Signed)
Chris [name] was rescheduled to a virtual visit due to Medicaid transportation failing to pick up patient.  Chris [name]'s phone would not connect to virtual so I called him over the phone.  Patient expressed significant frustration as his Medicaid transportation failed to pick him up although he was ready for it.  He denies safety concerns but expresses concern that he was not sleeping. He has been taking 2 hydroxyzine tablets and it had been somewhat helpful but insufficient. I sent in for 75 mg at bedtime prn of hydroxyzine to aid with his sleep. Denies acute somatic complaints and denies SI/HI/AVH.   Patient to scheduled for follow up in 1-2 months.

## 2023-03-25 ENCOUNTER — Encounter (HOSPITAL_COMMUNITY): Payer: Self-pay | Admitting: *Deleted

## 2023-03-25 ENCOUNTER — Other Ambulatory Visit: Payer: Self-pay

## 2023-03-25 ENCOUNTER — Emergency Department (HOSPITAL_COMMUNITY): Payer: Medicaid Other

## 2023-03-25 ENCOUNTER — Emergency Department (HOSPITAL_COMMUNITY)
Admission: EM | Admit: 2023-03-25 | Discharge: 2023-03-26 | Disposition: A | Discharge status: Home / Self Care | Payer: Medicaid Other | Attending: Emergency Medicine | Admitting: Emergency Medicine

## 2023-03-25 DIAGNOSIS — M25552 Pain in left hip: Secondary | ICD-10-CM | POA: Diagnosis not present

## 2023-03-25 DIAGNOSIS — Z7901 Long term (current) use of anticoagulants: Secondary | ICD-10-CM | POA: Diagnosis not present

## 2023-03-25 DIAGNOSIS — M79652 Pain in left thigh: Secondary | ICD-10-CM | POA: Diagnosis not present

## 2023-03-25 MED ORDER — OXYCODONE-ACETAMINOPHEN 5-325 MG PO TABS
1.0000 | ORAL_TABLET | Freq: Once | ORAL | Status: AC
  Administered 2023-03-25: 1 via ORAL
  Filled 2023-03-25: qty 1

## 2023-03-25 NOTE — ED Triage Notes (Signed)
The pt reports that he has had lt hip pain for a long time  but the pain is worse for the past 2 weeks.  No known injury

## 2023-03-26 MED ORDER — LIDOCAINE 5 % EX PTCH
1.0000 | MEDICATED_PATCH | CUTANEOUS | Status: DC
  Administered 2023-03-26: 1 via TRANSDERMAL
  Filled 2023-03-26: qty 1

## 2023-03-26 MED ORDER — LIDOCAINE 5 % EX PTCH: 1.0000 | MEDICATED_PATCH | CUTANEOUS | 0 refills | Status: DC

## 2023-03-26 MED ORDER — IBUPROFEN 800 MG PO TABS
800.0000 mg | ORAL_TABLET | Freq: Once | ORAL | Status: AC
  Administered 2023-03-26: 800 mg via ORAL
  Filled 2023-03-26: qty 1

## 2023-03-26 NOTE — ED Notes (Addendum)
Pt upset with wait, insulting and threatening this tech saying "I'm not the one to mess with"

## 2023-03-26 NOTE — ED Provider Notes (Signed)
[place] EMERGENCY DEPARTMENT AT [facility] Provider Note   CSN: [id number] Arrival date & time: 03/25/23  2043     History  Chief Complaint  Patient presents with   Hip Pain   HPI Chris [name] is a 62 y.o. male presenting for left hip pain.  States has had it for about a year but has been much worse in the last couple months.  States he was dead lifting about a year ago and noticed the pain in his left hip.  Pain has lingered since then.  Denies any new trauma to his leg since then.  Still able to ambulate and bear weight.  The pain has been progressively worse for the last 2 weeks.  Denies any notable swelling or redness around the left hip.  Pain is worse with ambulation.  Denies fever or chills.  States he has not tried any medications at home to help his pain.   Hip Pain       Home Medications Prior to Admission medications   Medication Sig Start Date End Date Taking? Authorizing Provider  lidocaine (LIDODERM) 5 % Place 1 patch onto the skin daily. Remove & Discard patch within 12 hours or as directed by MD 03/26/23  Yes Gareth Eagle, PA-C  Accu-Chek Softclix Lancets lancets Use as instructed 04/28/22   [name], MD  acetaminophen (TYLENOL) 500 MG tablet Take 1-2 tablets (500-1,000 mg total) by mouth every 6 (six) hours as needed (pain.). 12/09/22   Gerhard Munch, MD  albuterol (PROVENTIL) (2.5 MG/3ML) 0.083% nebulizer solution Take 3 mLs (2.5 mg total) by nebulization every 4 (four) hours as needed for wheezing or shortness of breath. 01/05/23 02/04/23  Anders Simmonds, PA-C  albuterol (VENTOLIN HFA) 108 (90 Base) MCG/ACT inhaler Inhale 2 puffs into the lungs every 6 (six) hours as needed for wheezing or shortness of breath. 12/08/22   [name], MD  allopurinol (ZYLOPRIM) 100 MG tablet TAKE 2 TABLETS (200 MG TOTAL) BY MOUTH DAILY. Patient not taking: Reported on 03/09/2023 01/05/23   Anders Simmonds, PA-C  apixaban (ELIQUIS) 5 MG TABS  tablet Take 1 tablet (5 mg total) by mouth 2 (two) times daily. 12/08/22   Laurey Morale, MD  atorvastatin (LIPITOR) 80 MG tablet Take 1 tablet (80 mg total) by mouth daily. 12/23/22 03/23/23  Laurey Morale, MD  baclofen (LIORESAL) 10 MG tablet Take 1 tablet (10 mg total) by mouth 3 (three) times daily. Patient not taking: Reported on 03/09/2023 12/20/22   Arthor Captain, PA-C  Blood Glucose Monitoring Suppl (ACCU-CHEK AVIVA PLUS) w/Device KIT 1 each by Does not apply route 3 (three) times daily. 03/09/23   [name], MD  budesonide-formoterol (SYMBICORT) 160-4.5 MCG/ACT inhaler Inhale 2 puffs into the lungs 2 (two) times daily. 12/08/22   [name], MD  carvedilol (COREG) 12.5 MG tablet Take 1 tablet (12.5 mg total) by mouth 2 (two) times daily. 12/08/22   Laurey Morale, MD  colchicine 0.6 MG tablet SMARTSIG:0.5 Tablet(s) By Mouth 3 Times a Week 02/22/23   [provider]  dapagliflozin propanediol (FARXIGA) 10 MG TABS tablet Take 1 tablet (10 mg total) by mouth daily. 12/08/22   [name], MD  hydrOXYzine (ATARAX) 25 MG tablet Take 2-3 tablets (50-75 mg total) by mouth at bedtime as needed. Take 1 tablet 3 times a day as needed for anxiety. Take 2 tablets at night as need for sleep 03/24/23   Park Pope, MD  isosorbide-hydrALAZINE (BIDIL) 20-37.5 MG tablet Take 1 tablet by mouth 3 (three) times daily. 08/03/22   [name], MD  mirtazapine (REMERON) 15 MG tablet Take 1 tablet (15 mg total) by mouth at bedtime. 03/24/23 05/23/23  Park Pope, MD  potassium chloride (KLOR-CON M) 10 MEQ tablet Take 2 tablets (20 mEq total) by mouth daily. 12/08/22   Laurey Morale, MD  Tiotropium Bromide Monohydrate (SPIRIVA RESPIMAT) 2.5 MCG/ACT AERS Inhale 2 puffs into the lungs daily. 12/08/22   [name], MD  torsemide 40 MG TABS Take 20 mg daily, alternating with 40 mg daily. 12/09/22   Gerhard Munch, MD  traMADol (ULTRAM) 50 MG tablet Take 1 tablet (50 mg total) by  mouth every 6 (six) hours as needed. Patient not taking: Reported on 03/09/2023 12/20/22   Arthor Captain, PA-C  metoprolol tartrate (LOPRESSOR) 100 MG tablet Take 1 tablet (100 mg total) by mouth 2 (two) times daily. 07/09/20 07/11/20  Marrion Coy, MD      Allergies    Patient has no known allergies.    Review of Systems   See HPI for pertinent positives  Physical Exam Updated Vital Signs BP (!) 125/90 (BP Location: Right Arm)   Pulse 70   Temp 97.8 F (36.6 C)   Resp 19   Ht 6' (1.829 m)   Wt 126.6 kg   SpO2 96%   BMI 37.84 kg/m  Physical Exam Constitutional:      Appearance: Normal appearance.  HENT:     Head: Normocephalic.     Nose: Nose normal.  Eyes:     Conjunctiva/sclera: Conjunctivae normal.  Pulmonary:     Effort: Pulmonary effort is normal.  Musculoskeletal:     Left hip: Tenderness present. No deformity, lacerations, bony tenderness or crepitus. Normal range of motion. Normal strength.     Right foot: Normal capillary refill. Normal pulse.     Left foot: Normal capillary refill. Normal pulse.     Comments: No notable erythema, edema around the left hip.  Not hot to touch.  Symmetric leg length.  Neurological:     Mental Status: He is alert.  Psychiatric:        Mood and Affect: Mood normal.     ED Results / Procedures / Treatments   Labs (all labs ordered are listed, but only abnormal results are displayed) Labs Reviewed - No data to display  EKG None  Radiology DG FEMUR MIN 2 VIEWS LEFT  Result Date: 03/25/2023 CLINICAL DATA:  Pain. EXAM: LEFT FEMUR 2 VIEWS COMPARISON:  01/14/2020. FINDINGS: No acute fracture or dislocation is seen. 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 Chondrocalcinosis is noted at the knee. Vascular calcifications are present in the soft tissues. IMPRESSION: No acute osseous abnormality. Electronically Signed   By: Thornell Sartorius M.D.   On: 03/25/2023 23:40     Procedures Procedures    Medications Ordered in ED Medications  ibuprofen (ADVIL) tablet 800 mg (has no administration in time range)  lidocaine (LIDODERM) 5 % 1 patch (has no administration in time range)  oxyCODONE-acetaminophen (PERCOCET/ROXICET) 5-325 MG per tablet 1 tablet (1 tablet Oral Given 03/25/23 2115)    ED Course/ Medical Decision Making/ A&P                                 Medical Decision Making  62 year old well-appearing male presenting for left hip  pain.  Exam notable for tenderness about the left hip but otherwise reassuring.  DDx includes fracture, dislocation, septic arthritis, avascular necrosis of the hip, gout, arthritis, other.  X-ray revealed no acute osseous abnormalities.  Did discuss finding of possible Pellegrini-Stieda lesion with the patient.  Treated hip pain with ibuprofen and Lidoderm patch.  Joint does not appear to be infected.  Doubt avascular necrosis given reassuring range of motion and symmetric leg length.  Suspect this is chronic hip pain.  Advised conservative treatment at home.  Also recommended NSAIDs.  Sent Lidoderm patches to his pharmacy.  Advised if symptoms persisted to follow-up with orthopedics.  Vital stable.  Discharged home in good condition.        Final Clinical Impression(s) / ED Diagnoses Final diagnoses:  Left hip pain    Rx / DC Orders ED Discharge Orders          Ordered    lidocaine (LIDODERM) 5 %  Every 24 hours        03/26/23 0556              Gareth Eagle, PA-C 03/26/23 0557    Tilden Fossa, MD 03/26/23 [phone]

## 2023-03-26 NOTE — Discharge Instructions (Signed)
Evaluation today was overall reassuring.  I suspect that this is chronic hip pain.  Recommend conservative treatment at home which includes applying ice in the areas of pain 3-4 times a day for the next 3 days, also gentle stretching.  You can also use ibuprofen for pain relief.  You can also use Tylenol as well if you need a second agent for your pain.  If you start to notice swelling in the hip joint, redness or is hot to touch you develop a fever or have difficulty ambulating or bearing weight please return emergency department further evaluation.  Otherwise recommend you follow-up with orthopedics if your symptoms persist.

## 2023-03-26 NOTE — ED Notes (Signed)
Pt continuing to insult this tech and complain about [place] "picking whoever we want" to go to rooms

## 2023-03-28 ENCOUNTER — Ambulatory Visit (INDEPENDENT_AMBULATORY_CARE_PROVIDER_SITE_OTHER): Payer: Medicaid Other | Admitting: Student in an Organized Health Care Education/Training Program

## 2023-03-28 ENCOUNTER — Encounter: Payer: Self-pay | Admitting: Student in an Organized Health Care Education/Training Program

## 2023-03-28 VITALS — BP 130/80 | HR 72 | Temp 97.7°F | Ht 72.0 in | Wt 272.8 lb

## 2023-03-28 DIAGNOSIS — J449 Chronic obstructive pulmonary disease, unspecified: Secondary | ICD-10-CM

## 2023-03-28 DIAGNOSIS — F17200 Nicotine dependence, unspecified, uncomplicated: Secondary | ICD-10-CM

## 2023-03-28 DIAGNOSIS — Z23 Encounter for immunization: Secondary | ICD-10-CM

## 2023-03-28 MED ORDER — FLUTICASONE-SALMETEROL 230-21 MCG/ACT IN AERO
2.0000 | INHALATION_SPRAY | Freq: Two times a day (BID) | RESPIRATORY_TRACT | 12 refills | Status: DC
Start: 2023-03-28 — End: 2023-07-26

## 2023-03-28 NOTE — Progress Notes (Signed)
Synopsis: Referred in for shortness of breath by [name], MD  Assessment & Plan:   1. Chronic obstructive pulmonary disease 2. Post COPD/Asthma Overlap  Mr. Lorona is a pleasant 62 year old male patient with a significant smoking history presenting to clinic for follow up. He was presumptively diagnosed with COPD given smoking history, though I suspect possible Asthma/COPD overlap given his history of elevated eosinophils (700 in 2022) as well as report of using an inhaler in school.   During his previous clinic visit, We switched his inhalers and started him on Triple therapy (ICS/LABA/LAMA). Today, given he continues to have symptoms, I will increase the dose of the ICS component (switch Symbicort HFA to Advair HFA high dose). He will reach out to our office if Advair HFA is not covered by insurance. I have also re-ordered PFT's, this will be important to assess for reversibility and to assess the degree of his obstruction and potential for air trapping.   I reviewed his previous imaging in our record, including a CT scans of the chest. Scans do not show any signs of emphysema and rather suggests some airway thickening; for this reason I did send alpha-1 levels.   Finally, I counseled Mr. Malia extensively about the need to quit smoking and limit his inhalational exposure.  This is going to be of utmost importance to control his symptoms.   -PFT's -Continue Spiriva -Switch Symbicort to Advair HFA -Continue Albuterol PRN -Influenza vaccine today  2. Tobacco dependence  Reports history of cigarette smoking, continues to smoke. Counseled on the importance of smoking cessation  -amb referral to lung cancer screening   Return in about 6 months (around 09/26/2023).  I spent 30 minutes caring for this patient today, including preparing to see the patient, obtaining a medical history , reviewing a separately obtained history, performing a medically appropriate examination  and/or evaluation, counseling and educating the patient/family/caregiver, ordering medications, tests, or procedures, and documenting clinical information in the electronic health record  Raechel Chute, MD [facility] 03/28/2023 9:07 AM    End of visit medications:  Meds ordered this encounter  Medications   fluticasone-salmeterol (ADVAIR HFA) 230-21 MCG/ACT inhaler    Sig: Inhale 2 puffs into the lungs 2 (two) times daily.    Dispense:  1 each    Refill:  12     Current Outpatient Medications:    Accu-Chek Softclix Lancets lancets, Use as instructed, Disp: 100 each, Rfl: 12   acetaminophen (TYLENOL) 500 MG tablet, Take 1-2 tablets (500-1,000 mg total) by mouth every 6 (six) hours as needed (pain.)., Disp: 30 tablet, Rfl: 0   albuterol (VENTOLIN HFA) 108 (90 Base) MCG/ACT inhaler, Inhale 2 puffs into the lungs every 6 (six) hours as needed for wheezing or shortness of breath., Disp: 54 g, Rfl: 6   allopurinol (ZYLOPRIM) 100 MG tablet, TAKE 2 TABLETS (200 MG TOTAL) BY MOUTH DAILY., Disp: 60 tablet, Rfl: 2   apixaban (ELIQUIS) 5 MG TABS tablet, Take 1 tablet (5 mg total) by mouth 2 (two) times daily., Disp: 60 tablet, Rfl: 11   baclofen (LIORESAL) 10 MG tablet, Take 1 tablet (10 mg total) by mouth 3 (three) times daily., Disp: 30 each, Rfl: 0   Blood Glucose Monitoring Suppl (ACCU-CHEK AVIVA PLUS) w/Device KIT, 1 each by Does not apply route 3 (three) times daily., Disp: 1 kit, Rfl: 0   carvedilol (COREG) 12.5 MG tablet, Take 1 tablet (12.5 mg total) by mouth 2 (two) times daily., Disp:  180 tablet, Rfl: 3   colchicine 0.6 MG tablet, SMARTSIG:0.5 Tablet(s) By Mouth 3 Times a Week, Disp: , Rfl:    dapagliflozin propanediol (FARXIGA) 10 MG TABS tablet, Take 1 tablet (10 mg total) by mouth daily., Disp: 90 tablet, Rfl: 3   fluticasone-salmeterol (ADVAIR HFA) 230-21 MCG/ACT inhaler, Inhale 2 puffs into the lungs 2 (two) times daily., Disp: 1 each, Rfl: 12   hydrOXYzine  (ATARAX) 25 MG tablet, Take 2-3 tablets (50-75 mg total) by mouth at bedtime as needed. Take 1 tablet 3 times a day as needed for anxiety. Take 2 tablets at night as need for sleep, Disp: 120 tablet, Rfl: 2   isosorbide-hydrALAZINE (BIDIL) 20-37.5 MG tablet, Take 1 tablet by mouth 3 (three) times daily., Disp: 270 tablet, Rfl: 3   lidocaine (LIDODERM) 5 %, Place 1 patch onto the skin daily. Remove & Discard patch within 12 hours or as directed by MD, Disp: 30 patch, Rfl: 0   mirtazapine (REMERON) 15 MG tablet, Take 1 tablet (15 mg total) by mouth at bedtime., Disp: 30 tablet, Rfl: 1   potassium chloride (KLOR-CON M) 10 MEQ tablet, Take 2 tablets (20 mEq total) by mouth daily., Disp: 180 tablet, Rfl: 3   Tiotropium Bromide Monohydrate (SPIRIVA RESPIMAT) 2.5 MCG/ACT AERS, Inhale 2 puffs into the lungs daily., Disp: 4 g, Rfl: 6   torsemide 40 MG TABS, Take 20 mg daily, alternating with 40 mg daily., Disp: 30 tablet, Rfl: 0   traMADol (ULTRAM) 50 MG tablet, Take 1 tablet (50 mg total) by mouth every 6 (six) hours as needed., Disp: 15 tablet, Rfl: 0   albuterol (PROVENTIL) (2.5 MG/3ML) 0.083% nebulizer solution, Take 3 mLs (2.5 mg total) by nebulization every 4 (four) hours as needed for wheezing or shortness of breath., Disp: 300 mL, Rfl: 2   atorvastatin (LIPITOR) 80 MG tablet, Take 1 tablet (80 mg total) by mouth daily., Disp: 90 tablet, Rfl: 1   Subjective:   PATIENT ID: Chris [name] GENDER: male DOB: [birth year], MRN: [id number]  Chief Complaint  Patient presents with   Follow-up    SOB with exertion, occ wheezing and prod cough with white sputum.     HPI  Mr. Teahan is a pleasant 62 year old male presenting to clinic for follow up.   Patient is reporting stable symptoms since his last visit. He hasn't had any breathing issues, and reports stability in his dyspnea. He is short of breath with exertion, which is not changed by much. He has a cough that is sometimes productive of clear  sputum. Denies any wheeze or chest tightness. Reports compliance with his inhaler therapy.   During our prior visits, he's reported continued symptoms, and reports them for over 3 to 4 years. Reports having been told he has asthma growing up, and had used an inhaler when playing football in school. He is on LABA/LAMA/ICS at this point. He is yet to have his PFT's. He is followed closely by Dr. Shirlee Latch from advanced heart failure, last seen there in September of 2024. Most recent RHC and LHC done June of 2024, showing 65% proximal RCA stenosis, normal filling pressures, and low CI. He's continued on diuretics and medical management for HFpEF.   He is a lifelong smoker and had a some point in his life smoked 2 packs a day.  He currently reports that he smokes 5-6 cigarettes a day. He feels significantly better when he doesn't smoke. He likely has more than 45 pack years of  smoking history. He also reports smoking marijuana (smokes flower, rolls into a joint, does not use filters). He has a history of smoking crack cocaine. He does not have any family history of lung diseases.  He tells me that he was previously incarcerated.  He does not have any pets, denies having any birds, and reports some mildew in the bathroom of his house but no other mold damage.  Ancillary information including prior medications, full medical/surgical/family/social histories, and PFTs (when available) are listed below and have been reviewed.   Review of Systems  Constitutional:  Negative for chills, fever, malaise/fatigue and weight loss.  Respiratory:  Positive for cough, sputum production and shortness of breath. Negative for hemoptysis and wheezing.   Cardiovascular:  Negative for chest pain, palpitations and leg swelling.     Objective:   Vitals:   03/28/23 0839  BP: 130/80  Pulse: 72  Temp: 97.7 F (36.5 C)  TempSrc: Temporal  SpO2: 96%  Weight: 272 lb 12.8 oz (123.7 kg)  Height: 6' (1.829 m)   96% on RA BMI  Readings from Last 3 Encounters:  03/28/23 37.00 kg/m  03/25/23 37.84 kg/m  02/25/23 36.62 kg/m   Wt Readings from Last 3 Encounters:  03/28/23 272 lb 12.8 oz (123.7 kg)  03/25/23 279 lb (126.6 kg)  02/25/23 270 lb (122.5 kg)    Physical Exam Constitutional:      Appearance: Normal appearance. He is not ill-appearing.  Cardiovascular:     Rate and Rhythm: Normal rate and regular rhythm.     Pulses: Normal pulses.     Heart sounds: Normal heart sounds.  Pulmonary:     Effort: Pulmonary effort is normal.     Breath sounds: Normal breath sounds. No wheezing or rales.  Abdominal:     Palpations: Abdomen is soft.  Neurological:     General: No focal deficit present.     Mental Status: He is alert and oriented to person, place, and time. Mental status is at baseline.       Ancillary Information    Past Medical History:  Diagnosis Date   Arrhythmia    atrial flutter   CHF (congestive heart failure) (HCC)    Chronic kidney disease    COPD (chronic obstructive pulmonary disease) (HCC)    Coronary artery disease    Depression    Diabetes mellitus without complication (HCC)    GERD (gastroesophageal reflux disease)    Gout    Hypertension    Influenza A with respiratory manifestations    Mental disorder      Family History  Problem Relation Age of Onset   Heart disease Father    Diabetes Mother    HIV Brother    Healthy Son    Healthy Daughter      Past Surgical History:  Procedure Laterality Date   ANKLE SURGERY     CARDIAC CATHETERIZATION     CARDIOVERSION N/A 07/08/2020   Procedure: CARDIOVERSION;  Surgeon: Lamar Blinks, MD;  Location: [facility] ORS;  Service: Cardiovascular;  Laterality: N/A;   COLONOSCOPY WITH PROPOFOL N/A 11/19/2021   Procedure: COLONOSCOPY WITH PROPOFOL;  Surgeon: Jenel Lucks, MD;  Location: [facility];  Service: Gastroenterology;  Laterality: N/A;   HERNIA REPAIR     x2   IR RADIOLOGIST EVAL & MGMT  05/13/2022   IR  RADIOLOGIST EVAL & MGMT  10/27/2022   IR RADIOLOGIST EVAL & MGMT  03/02/2023   POLYPECTOMY  11/19/2021   Procedure: POLYPECTOMY;  Surgeon: Jenel Lucks, MD;  Location: [facility];  Service: Gastroenterology;;   RADIOLOGY WITH ANESTHESIA N/A 06/23/2022   Procedure: Right renal tumor ablation;  Surgeon: Bennie Dallas, MD;  Location: [facility] OR;  Service: Radiology;  Laterality: N/A;   RIGHT HEART CATH N/A 07/13/2022   Procedure: RIGHT HEART CATH;  Surgeon: Laurey Morale, MD;  Location: [facility] INVASIVE CV LAB;  Service: Cardiovascular;  Laterality: N/A;   RIGHT/LEFT HEART CATH AND CORONARY ANGIOGRAPHY N/A 11/19/2022   Procedure: RIGHT/LEFT HEART CATH AND CORONARY ANGIOGRAPHY;  Surgeon: Laurey Morale, MD;  Location: [facility] INVASIVE CV LAB;  Service: Cardiovascular;  Laterality: N/A;   SHOULDER SURGERY     TEE WITHOUT CARDIOVERSION N/A 07/08/2020   Procedure: TRANSESOPHAGEAL ECHOCARDIOGRAM (TEE);  Surgeon: Lamar Blinks, MD;  Location: [facility] ORS;  Service: Cardiovascular;  Laterality: N/A;    Social History   Socioeconomic History   Marital status: Divorced    Spouse name: Not on file   Number of children: 3   Years of education: Not on file   Highest education level: High school graduate  Occupational History   Occupation: disability  Tobacco Use   Smoking status: Every Day    Current packs/day: 1.00    Average packs/day: 1 pack/day for 43.0 years (43.0 ttl pk-yrs)    Types: Cigarettes   Smokeless tobacco: Never   Tobacco comments:        6 cigs daily--03/28/2023  Vaping Use   Vaping status: Never Used  Substance and Sexual Activity   Alcohol use: Yes    Alcohol/week: 4.0 standard drinks of alcohol    Types: 4 Shots of liquor per week   Drug use: Yes    Frequency: 21.0 times per week    Types: Marijuana    Comment: last use Cocaine- 03/28/2021. Still using marijuana, last use 06/22/21   Sexual activity: Not on file  Other Topics Concern   Not on file  Social History Narrative    ** Merged History Encounter **       Social Determinants of Health   Financial Resource Strain: Medium Risk (03/04/2023)   Overall Financial Resource Strain (CARDIA)    Difficulty of Paying Living Expenses: Somewhat hard  Food Insecurity: No Food Insecurity (03/09/2023)   Hunger Vital Sign    Worried About Running Out of Food in the Last Year: Never true    Ran Out of Food in the Last Year: Never true  Transportation Needs: Unmet Transportation Needs (02/03/2023)   PRAPARE - Transportation    Lack of Transportation (Medical): Yes    Lack of Transportation (Non-Medical): Yes  Physical Activity: Insufficiently Active (11/16/2021)   Exercise Vital Sign    Days of Exercise per Week: 5 days    Minutes of Exercise per Session: 20 min  Stress: No Stress Concern Present (03/04/2023)   [employer] of Occupational Health - Occupational Stress Questionnaire    Feeling of Stress : Only a little  Recent Concern: Stress - Stress Concern Present (01/17/2023)   [employer] of Occupational Health - Occupational Stress Questionnaire    Feeling of Stress : To some extent  Social Connections: Moderately Isolated (07/23/2022)   Social Connection and Isolation Panel [NHANES]    Frequency of Communication with Friends and Family: Twice a week    Frequency of Social Gatherings with Friends and Family: Once a week    Attends Religious Services: More than 4 times per year    Active Member of [employer] or Organizations:  No    Attends Club or Organization Meetings: Never    Marital Status: Divorced  Intimate Partner Violence: Not At Risk (04/20/2022)   Humiliation, Afraid, Rape, and Kick questionnaire    Fear of Current or Ex-Partner: No    Emotionally Abused: No    Physically Abused: No    Sexually Abused: No     No Known Allergies   CBC    Component Value Date/Time   WBC 9.8 12/20/2022 1007   RBC 5.36 12/20/2022 1007   HGB 15.8 12/20/2022 1007   HGB 15.4 11/07/2019 0918   HCT 50.3 12/20/2022  1007   HCT 46.2 11/07/2019 0918   PLT 171 12/20/2022 1007   PLT 245 11/07/2019 0918   MCV 93.8 12/20/2022 1007   MCV 90 11/07/2019 0918   MCV 92 10/21/2013 0435   MCH 29.5 12/20/2022 1007   MCHC 31.4 12/20/2022 1007   RDW 15.2 12/20/2022 1007   RDW 14.2 11/07/2019 0918   RDW 15.7 (H) 10/21/2013 0435   LYMPHSABS 2.1 12/09/2022 0930   LYMPHSABS 1.1 10/21/2013 0435   MONOABS 0.8 12/09/2022 0930   MONOABS 0.6 10/21/2013 0435   EOSABS 0.1 12/09/2022 0930   EOSABS 0.0 10/21/2013 0435   BASOSABS 0.1 12/09/2022 0930   BASOSABS 0.0 10/21/2013 0435    Pulmonary Functions Testing Results:     No data to display          Outpatient Medications Prior to Visit  Medication Sig Dispense Refill   Accu-Chek Softclix Lancets lancets Use as instructed 100 each 12   acetaminophen (TYLENOL) 500 MG tablet Take 1-2 tablets (500-1,000 mg total) by mouth every 6 (six) hours as needed (pain.). 30 tablet 0   albuterol (VENTOLIN HFA) 108 (90 Base) MCG/ACT inhaler Inhale 2 puffs into the lungs every 6 (six) hours as needed for wheezing or shortness of breath. 54 g 6   allopurinol (ZYLOPRIM) 100 MG tablet TAKE 2 TABLETS (200 MG TOTAL) BY MOUTH DAILY. 60 tablet 2   apixaban (ELIQUIS) 5 MG TABS tablet Take 1 tablet (5 mg total) by mouth 2 (two) times daily. 60 tablet 11   baclofen (LIORESAL) 10 MG tablet Take 1 tablet (10 mg total) by mouth 3 (three) times daily. 30 each 0   Blood Glucose Monitoring Suppl (ACCU-CHEK AVIVA PLUS) w/Device KIT 1 each by Does not apply route 3 (three) times daily. 1 kit 0   carvedilol (COREG) 12.5 MG tablet Take 1 tablet (12.5 mg total) by mouth 2 (two) times daily. 180 tablet 3   colchicine 0.6 MG tablet SMARTSIG:0.5 Tablet(s) By Mouth 3 Times a Week     dapagliflozin propanediol (FARXIGA) 10 MG TABS tablet Take 1 tablet (10 mg total) by mouth daily. 90 tablet 3   hydrOXYzine (ATARAX) 25 MG tablet Take 2-3 tablets (50-75 mg total) by mouth at bedtime as needed. Take 1 tablet 3  times a day as needed for anxiety. Take 2 tablets at night as need for sleep 120 tablet 2   isosorbide-hydrALAZINE (BIDIL) 20-37.5 MG tablet Take 1 tablet by mouth 3 (three) times daily. 270 tablet 3   lidocaine (LIDODERM) 5 % Place 1 patch onto the skin daily. Remove & Discard patch within 12 hours or as directed by MD 30 patch 0   mirtazapine (REMERON) 15 MG tablet Take 1 tablet (15 mg total) by mouth at bedtime. 30 tablet 1   potassium chloride (KLOR-CON M) 10 MEQ tablet Take 2 tablets (20 mEq total) by mouth daily. 180  tablet 3   Tiotropium Bromide Monohydrate (SPIRIVA RESPIMAT) 2.5 MCG/ACT AERS Inhale 2 puffs into the lungs daily. 4 g 6   torsemide 40 MG TABS Take 20 mg daily, alternating with 40 mg daily. 30 tablet 0   traMADol (ULTRAM) 50 MG tablet Take 1 tablet (50 mg total) by mouth every 6 (six) hours as needed. 15 tablet 0   budesonide-formoterol (SYMBICORT) 160-4.5 MCG/ACT inhaler Inhale 2 puffs into the lungs 2 (two) times daily. 30.6 g 6   albuterol (PROVENTIL) (2.5 MG/3ML) 0.083% nebulizer solution Take 3 mLs (2.5 mg total) by nebulization every 4 (four) hours as needed for wheezing or shortness of breath. 300 mL 2   atorvastatin (LIPITOR) 80 MG tablet Take 1 tablet (80 mg total) by mouth daily. 90 tablet 1   No facility-administered medications prior to visit.

## 2023-03-28 NOTE — Addendum Note (Signed)
Addended by: Lajoyce Lauber A on: 03/28/2023 09:12 AM   Modules accepted: Orders

## 2023-03-30 ENCOUNTER — Other Ambulatory Visit: Payer: Self-pay | Admitting: Licensed Clinical Social Worker

## 2023-03-30 NOTE — Patient Outreach (Signed)
Medicaid Managed Care Social Work Note  03/30/2023 Name:  Chris [name] MRN:  [id number] DOB:  07-Jan-1961  Chris [name] is an 62 y.o. year old male who is a primary patient of Chris Matar, MD.  The Medicaid Managed Care Coordination team was consulted for assistance with:  Mental Health Counseling and Resources  Chris [name] was given information about Medicaid Managed Care Coordination team services today. Chris [name] Patient agreed to services and verbal consent obtained.  Engaged with patient  for by telephone forfollow up visit in response to referral for case management and/or care coordination services.   Assessments/Interventions:  Review of past medical history, allergies, medications, health status, including review of consultants reports, laboratory and other test data, was performed as part of comprehensive evaluation and provision of chronic care management services.  SDOH: (Social Determinant of Health) assessments and interventions performed: SDOH Interventions    Flowsheet Row Patient Outreach Telephone from 03/30/2023 in [place] POPULATION HEALTH DEPARTMENT Patient Outreach Telephone from 03/09/2023 in [place] POPULATION HEALTH DEPARTMENT Patient Outreach Telephone from 03/04/2023 in [place] POPULATION HEALTH DEPARTMENT Patient Outreach Telephone from 01/17/2023 in [place] POPULATION HEALTH DEPARTMENT Telephone from 12/23/2022 in [place] POPULATION HEALTH DEPARTMENT Patient Outreach Telephone from 12/17/2022 in [place] POPULATION HEALTH DEPARTMENT  SDOH Interventions        Food Insecurity Interventions -- Intervention Not Indicated -- -- -- --  Housing Interventions -- Intervention Not Indicated -- -- -- --  Transportation Interventions -- -- -- -- Payor Benefit --  Financial Strain Interventions -- -- Other (Comment)  [name] [name] involved and has a F/U appt] -- -- --  Stress Interventions Offered [employer], Provide Counseling   [Pt reports his BH meds continues to help with anxiety and sleep but he is struggling with pain in his hip at this time which led to a recent ED visit] -- [employer], Provide Counseling Offered [employer], Provide Counseling -- [employer], Provide Counseling       Advanced Directives Status:  See Care Plan for related entries.  Care Plan                 No Known Allergies  Medications Reviewed Today   Medications were not reviewed in this encounter     Patient Active Problem List   Diagnosis Date Noted   Post traumatic stress disorder 09/23/2022   MDD (major depressive disorder), recurrent episode, moderate (HCC) 08/26/2022   Stage 3b chronic kidney disease (HCC) 08/09/2022   Hypertrophic cardiomyopathy (HCC) 08/09/2022   History of renal cell cancer 08/09/2022   Chronic heart failure with preserved ejection fraction (HFpEF) (HCC) 07/13/2022   Agitation 04/23/2022   RSV (respiratory syncytial virus pneumonia) 04/22/2022   Obesity (BMI 30-39.9) 04/21/2022   COPD exacerbation (HCC) 04/19/2022   Dyslipidemia 04/19/2022   Gout 04/19/2022   Right kidney mass 05/14/2021   CHF exacerbation (HCC) 04/14/2021   Chest pain 04/14/2021   Syncope 04/14/2021   Left-sided weakness 10/28/2020   Typical atrial flutter (HCC)    CHF (congestive heart failure) (HCC) 07/04/2020   Acute exacerbation of CHF (congestive heart failure) (HCC) 06/16/2020   Influenza vaccine refused 05/06/2020   Acute decompensated heart failure (HCC) 05/04/2020   Illiteracy 05/04/2020   Type 2 diabetes mellitus with stage 3 chronic kidney disease (HCC) 12/25/2019   Elevated troponin I level 10/26/2019   History of gout 02/01/2019   Seasonal allergic rhinitis due to  pollen 02/01/2019   Nicotine use disorder 11/30/2018   Microscopic hematuria 11/30/2018   Depression 11/30/2018   Difficulty controlling anger 11/30/2018   COPD (chronic obstructive  pulmonary disease) (HCC)    CKD (chronic kidney disease) stage 3, GFR 30-59 ml/min (HCC) 08/10/2018   Recurrent epistaxis 04/21/2018   Mixed hyperlipidemia 07/28/2017   Essential hypertension 07/28/2017   Chronic systolic heart failure (HCC) 10/25/2014   Cocaine abuse (HCC) 02/20/2013   Cannabis abuse 02/20/2013   Back pain, chronic 02/20/2013    Conditions to be addressed/monitored per PCP order:  Anxiety and Depression  Care Plan : General Social Work (Adult)  Updates made by Chris Bryant, LCSW since 03/30/2023 12:00 AM     Problem: Depression Identification (Depression)      Long-Range Goal: To increase my self care and gain mental health support   Start Date: 07/08/2022  Priority: High  Note:   Timeframe:  Long-Range Goal Priority:  High Start Date:   07/08/22                    Expected End Date:ongoing    Follow Up Date- 04/29/23 at 9:15 am   - begin personal counseling - call and visit an old friend - check out volunteer opportunities - join a support group - laugh; watch a funny movie or comedian - learn and use visualization or guided imagery - perform a random act of kindness - practice relaxation or meditation daily - start or continue a personal journal - talk about feelings with a friend, family or spiritual advisor - practice positive thinking and self-talk    Why is this important?   When you are stressed, down or upset, your body reacts too.  For example, your blood pressure may get higher; you may have a headache or stomachache.  When your emotions get the best of you, your body's ability to fight off cold and flu gets weak.  These steps will help you manage your emotions.   Current Barriers:  Chronic Mental Health needs related to depression and need for housing Limited social support, Housing barriers, and Mental Health Concerns  Social Isolation ADL IADL limitations Suicidal Ideation/Homicidal Ideation: No  Clinical Social Work Goal(s):   Over the next 120 days, patient will work with LCSW monthly by telephone or in person to reduce or manage symptoms related to depression and relapse prevention patient will work with [name] to address needs related to financial, food and transportation support. Patient wishes to offset medical and housing expenses with financial support as housing resources are very limited at this time  Interventions: Patient interviewed and appropriate assessments performed: brief mental health assessment Discussed plans with patient for ongoing care management follow up and provided patient with direct contact information for care management team Assisted patient/caregiver with obtaining information about health plan benefits Patient was educated on the Advance Directives process. Patient was encouraged to complete document with PCP.  [facility] LCSW provided emotional support and reflective listening.  Solution-Focused Strategies, Mindfulness or Relaxation Training, Active listening / Reflection utilized , Emotional Supportive Provided, and Verbalization of feelings encouraged  Patient declines any current substance use. Patient has a history of cocaine abuse. Relapse prevention education provided.  Patient reports that he has developed social anxiety. He admits that he gets irritable at times and will snap at his loved ones and then will experience guilt afterwards. Educated patient on coping methods to implement into his daily life to combat anxiety symptoms and stress. Patient  denied any current suicidal or homicidal ideations. Encouraged patient to implement deep breathing and grounding exercises into his daily routine due to ongoing anxiety and SOB.  Patient reports that he will start implementing appropriate self-care habits into his daily routine such as: drinking water, staying active around the house, taking his medications as prescribed, combating negative thoughts or emotions and staying connected with his support  network. Positive reinforcement provided.  Past Update- Patient missed his psychiatry appointment yesterday at [facility] which makes the second no show. He is at risk of not being able to be seen at their practice and was advised to contact them today to inquire. Patient reports that he will be able to attend his upcoming counseling appointment on 03/15/23. [facility] LCSW provided extensive self-care education which including keeping up with his appointments. Patient was advised to use a calendar to assist with his scheduling. Patient reports that he is stable and his mood management continues to improve. 03/30/23 update- Patient reports that he was ready for his follow up psychiatry appointment this month and his Medicaid transportation did not arrive so he had to complete a video visit with his provider which thankfully was able to accommodate. [facility] LCSW reminded patient of his new follow up behavioral health appointments. Patient reports that his [facility] medicine continues to help with his sleep and anxiety but now he is experiencing more physical pain in his hip area which led to a recent ED visit. [facility] LCSW advised pt to discuss pain management options with his PCP. He was advised to contact PCP office today. [facility] LCSW provided brief self-care and anxiety management coping skill education.   Patient Self Care Activities:  Ability for insight Agreement to start mental health treatment and to the self-care journey   Patient Coping Strengths:  Self Advocate Able to Communicate Effectively  Patient Self Care Deficits:  Lacks social connections  The following coping skill education was provided for stress relief and mental health management: "When your car dies or a deadline looms, how do you respond? Long-term, low-grade or acute stress takes a serious toll on your body and mind, so don't ignore feelings of constant tension. Stress is a natural part of life. However, too much stress can harm our health, especially if it  continues every day. This is chronic stress and can put you at risk for heart problems like heart disease and depression. Understand what's happening inside your body and learn simple coping skills to combat the negative impacts of everyday stressors.  Types of Stress There are two types of stress: Emotional - types of emotional stress are relationship problems, pressure at work, financial worries, experiencing discrimination or having a major life change. Physical - Examples of physical stress include being sick having pain, not sleeping well, recovery from an injury or having an alcohol and drug use disorder. Fight or Flight Sudden or ongoing stress activates your nervous system and floods your bloodstream with adrenaline and cortisol, two hormones that raise blood pressure, increase heart rate and spike blood sugar. These changes pitch your body into a fight or flight response. That enabled our ancestors to outrun saber-toothed tigers, and it's helpful today for situations like dodging a car accident. But most modern chronic stressors, such as finances or a challenging relationship, keep your body in that heightened state, which hurts your health. Effects of Too Much Stress If constantly under stress, most of [place] will eventually start to function less well.  Multiple studies link chronic stress to a higher  risk of heart disease, stroke, depression, weight gain, memory loss and even premature death, so it's important to recognize the warning signals. Talk to your doctor about ways to manage stress if you're experiencing any of these symptoms: Prolonged periods of poor sleep. Regular, severe headaches. Unexplained weight loss or gain. Feelings of isolation, withdrawal or worthlessness. Constant anger and irritability. Loss of interest in activities. Constant worrying or obsessive thinking. Excessive alcohol or drug use. Inability to concentrate.  10 Ways to Cope with Chronic Stress It's key to  recognize stressful situations as they occur because it allows you to focus on managing how you react. We all need to know when to close our eyes and take a deep breath when we feel tension rising. Use these tips to prevent or reduce chronic stress. 1. Rebalance Work and Home All work and no play? If you're spending too much time at the office, intentionally put more dates in your calendar to enjoy time for fun, either alone or with others. 2. Get Regular Exercise Moving your body on a regular basis balances the nervous system and increases blood circulation, helping to flush out stress hormones. Even a daily 20-minute walk makes a difference. Any kind of exercise can lower stress and improve your mood ? just pick activities that you enjoy and make it a regular habit. 3. Eat Well and Limit Alcohol and Stimulants Alcohol, nicotine and caffeine may temporarily relieve stress but have negative health impacts and can make stress worse in the long run. Well-nourished bodies cope better, so start with a good breakfast, add more organic fruits and vegetables for a well-balanced diet, avoid processed foods and sugar, try herbal tea and drink more water. 4. Connect with Supportive People Talking face to face with another person releases hormones that reduce stress. Lean on those good listeners in your life. 5. Carve Out Hobby Time Do you enjoy gardening, reading, listening to music or some other creative pursuit? Engage in activities that bring you pleasure and joy; research shows that reduces stress by almost half and lowers your heart rate, too. 6. Practice Meditation, Stress Reduction or Yoga Relaxation techniques activate a state of restfulness that counterbalances your body's fight-or-flight hormones. Even if this also means a 10-minute break in a long day: listen to music, read, go for a walk in nature, do a hobby, take a bath or spend time with a friend. Also consider doing a mindfulness exercise or try a  daily deep breathing or imagery practice. Deep Breathing Slow, calm and deep breathing can help you relax. Try these steps to focus on your breathing and repeat as needed. Find a comfortable position and close your eyes. Exhale and drop your shoulders. Breathe in through your nose; fill your lungs and then your belly. Think of relaxing your body, quieting your mind and becoming calm and peaceful. Breathe out slowly through your nose, relaxing your belly. Think of releasing tension, pain, worries or distress. Repeat steps three and four until you feel relaxed. Imagery This involves using your mind to excite the senses -- sound, vision, smell, taste and feeling. This may help ease your stress. Begin by getting comfortable and then do some slow breathing. Imagine a place you love being at. It could be somewhere from your childhood, somewhere you vacationed or just a place in your imagination. Feel how it is to be in the place you're imagining. Pay attention to the sounds, air, colors, and who is there with you. This is a place  where you feel cared for and loved. All is well. You are safe. Take in all the smells, sounds, tastes and feelings. As you do, feel your body being nourished and healed. Feel the calm that surrounds you. Breathe in all the good. Breathe out any discomfort or tension. 7. Sleep Enough If you get less than seven to eight hours of sleep, your body won't tolerate stress as well as it could. If stress keeps you up at night, address the cause, and add extra meditation into your day to make up for the lost z's. Try to get seven to nine hours of sleep each night. Make a regular bedtime schedule. Keep your room dark and cool. Try to avoid computers, TV, cell phones and tablets before bed. 8. Bond with Connections You Enjoy Go out for a coffee with a friend, chat with a neighbor, call a family member, visit with a clergy member, or even hang out with your pet. Clinical studies show that  spending even a short time with a companion animal can cut anxiety levels almost in half. 9. Take a Vacation Getting away from it all can reset your stress tolerance by increasing your mental and emotional outlook, which makes you a happier, more productive person upon return. Leave your cellphone and laptop at home! 10. See a Counselor, Coach or Therapist If negative thoughts overwhelm your ability to make positive changes, it's time to seek professional help. Make an appointment today--your health and life are worth it."     24- Hour Availability:    [facility]  [address] [place], [place] [phone] Crisis [phone]   [facility] [phone]   [place] [facility]  [phone]    [facility]  [phone] (after hours)   Therapeutic Alternative/Mobile Crisis   [phone]   [place] National Suicide Hotline  [phone] Len Childs) [place] [address]   Call 911 or go to emergency room   [facility]  [phone]);  [place] and [employer]  [phone]); [place], [place], [place], [place], [place], [place], [place]         Follow up goal      Follow up:  Patient agrees to Care Plan and Follow-up.  Plan: The Managed Medicaid care management team will reach out to the patient again over the next 30 days.  Dickie La, [name], MSW, [place] [facility] [place] Managed Medicaid LCSW [facility]  [facility] [email or web address] Phone: [phone]

## 2023-03-30 NOTE — Patient Instructions (Signed)
Visit Information  Chris [name] was given information about Medicaid Managed Care team care coordination services as a part of their Healthy [facility] Medicaid benefit. Chris [name] verbally consented to engagement with the [facility] Managed Care team.   If you are experiencing a medical emergency, please call 911 or report to your local emergency department or urgent care.   If you have a non-emergency medical problem during routine business hours, please contact your provider's office and ask to speak with a nurse.   For questions related to your Healthy [facility] health plan, please call: [phone] or visit the homepage here: [email or web address]  If you would like to schedule transportation through your Healthy [facility] plan, please call the following number at least 2 days in advance of your appointment: [phone]  For information about your ride after you set it up, call Ride Assist at [phone]. Use this number to activate a Will Call pickup, or if your transportation is late for a scheduled pickup. Use this number, too, if you need to make a change or cancel a previously scheduled reservation.  If you need transportation services right away, call [phone]. The after-hours call center is staffed 24 hours to handle ride assistance and urgent reservation requests (including discharges) 365 days a year. Urgent trips include sick visits, hospital discharge requests and life-sustaining treatment.  Call the [facility] Line at [phone], at any time, 24 hours a day, 7 days a week. If you are in danger or need immediate medical attention call 911.  If you would like help to quit smoking, call 1-800-QUIT-NOW (854 725 2966) OR Espaol: 1-855-Djelo-Ya (4-627-035-0093) o para ms informacin haga clic aqu or Text READY to 818-299 to register via text  Following is a copy of your plan of care:  Care Plan : General Social Work  (Adult)  Updates made by Gustavus Bryant, LCSW since 03/30/2023 12:00 AM     Problem: Depression Identification (Depression)      Long-Range Goal: To increase my self care and gain mental health support   Start Date: 07/08/2022  Priority: High  Note:   Timeframe:  Long-Range Goal Priority:  High Start Date:   07/08/22                    Expected End Date:ongoing    Follow Up Date- 04/29/23 at 9:15 am   - begin personal counseling - call and visit an old friend - check out volunteer opportunities - join a support group - laugh; watch a funny movie or comedian - learn and use visualization or guided imagery - perform a random act of kindness - practice relaxation or meditation daily - start or continue a personal journal - talk about feelings with a friend, family or spiritual advisor - practice positive thinking and self-talk    Why is this important?   When you are stressed, down or upset, your body reacts too.  For example, your blood pressure may get higher; you may have a headache or stomachache.  When your emotions get the best of you, your body's ability to fight off cold and flu gets weak.  These steps will help you manage your emotions.   Current Barriers:  Chronic Mental Health needs related to depression and need for housing Limited social support, Housing barriers, and Mental Health Concerns  Social Isolation ADL IADL limitations Suicidal Ideation/Homicidal Ideation: No  Clinical Social Work Goal(s):  Over the next 120 days, patient will work with LCSW monthly by  telephone or in person to reduce or manage symptoms related to depression and relapse prevention patient will work with [name] to address needs related to financial, food and transportation support. Patient wishes to offset medical and housing expenses with financial support as housing resources are very limited at this time  Patient Self Care Activities:  Ability for insight Agreement to start mental  health treatment and to the self-care journey   Patient Coping Strengths:  Self Advocate Able to Communicate Effectively  Patient Self Care Deficits:  Lacks social connections  The following coping skill education was provided for stress relief and mental health management: "When your car dies or a deadline looms, how do you respond? Long-term, low-grade or acute stress takes a serious toll on your body and mind, so don't ignore feelings of constant tension. Stress is a natural part of life. However, too much stress can harm our health, especially if it continues every day. This is chronic stress and can put you at risk for heart problems like heart disease and depression. Understand what's happening inside your body and learn simple coping skills to combat the negative impacts of everyday stressors.  Types of Stress There are two types of stress: Emotional - types of emotional stress are relationship problems, pressure at work, financial worries, experiencing discrimination or having a major life change. Physical - Examples of physical stress include being sick having pain, not sleeping well, recovery from an injury or having an alcohol and drug use disorder. Fight or Flight Sudden or ongoing stress activates your nervous system and floods your bloodstream with adrenaline and cortisol, two hormones that raise blood pressure, increase heart rate and spike blood sugar. These changes pitch your body into a fight or flight response. That enabled our ancestors to outrun saber-toothed tigers, and it's helpful today for situations like dodging a car accident. But most modern chronic stressors, such as finances or a challenging relationship, keep your body in that heightened state, which hurts your health. Effects of Too Much Stress If constantly under stress, most of [place] will eventually start to function less well.  Multiple studies link chronic stress to a higher risk of heart disease, stroke,  depression, weight gain, memory loss and even premature death, so it's important to recognize the warning signals. Talk to your doctor about ways to manage stress if you're experiencing any of these symptoms: Prolonged periods of poor sleep. Regular, severe headaches. Unexplained weight loss or gain. Feelings of isolation, withdrawal or worthlessness. Constant anger and irritability. Loss of interest in activities. Constant worrying or obsessive thinking. Excessive alcohol or drug use. Inability to concentrate.  10 Ways to Cope with Chronic Stress It's key to recognize stressful situations as they occur because it allows you to focus on managing how you react. We all need to know when to close our eyes and take a deep breath when we feel tension rising. Use these tips to prevent or reduce chronic stress. 1. Rebalance Work and Home All work and no play? If you're spending too much time at the office, intentionally put more dates in your calendar to enjoy time for fun, either alone or with others. 2. Get Regular Exercise Moving your body on a regular basis balances the nervous system and increases blood circulation, helping to flush out stress hormones. Even a daily 20-minute walk makes a difference. Any kind of exercise can lower stress and improve your mood ? just pick activities that you enjoy and make it a regular habit. 3.  Eat Well and Limit Alcohol and Stimulants Alcohol, nicotine and caffeine may temporarily relieve stress but have negative health impacts and can make stress worse in the long run. Well-nourished bodies cope better, so start with a good breakfast, add more organic fruits and vegetables for a well-balanced diet, avoid processed foods and sugar, try herbal tea and drink more water. 4. Connect with Supportive People Talking face to face with another person releases hormones that reduce stress. Lean on those good listeners in your life. 5. Carve Out Hobby Time Do you enjoy  gardening, reading, listening to music or some other creative pursuit? Engage in activities that bring you pleasure and joy; research shows that reduces stress by almost half and lowers your heart rate, too. 6. Practice Meditation, Stress Reduction or Yoga Relaxation techniques activate a state of restfulness that counterbalances your body's fight-or-flight hormones. Even if this also means a 10-minute break in a long day: listen to music, read, go for a walk in nature, do a hobby, take a bath or spend time with a friend. Also consider doing a mindfulness exercise or try a daily deep breathing or imagery practice. Deep Breathing Slow, calm and deep breathing can help you relax. Try these steps to focus on your breathing and repeat as needed. Find a comfortable position and close your eyes. Exhale and drop your shoulders. Breathe in through your nose; fill your lungs and then your belly. Think of relaxing your body, quieting your mind and becoming calm and peaceful. Breathe out slowly through your nose, relaxing your belly. Think of releasing tension, pain, worries or distress. Repeat steps three and four until you feel relaxed. Imagery This involves using your mind to excite the senses -- sound, vision, smell, taste and feeling. This may help ease your stress. Begin by getting comfortable and then do some slow breathing. Imagine a place you love being at. It could be somewhere from your childhood, somewhere you vacationed or just a place in your imagination. Feel how it is to be in the place you're imagining. Pay attention to the sounds, air, colors, and who is there with you. This is a place where you feel cared for and loved. All is well. You are safe. Take in all the smells, sounds, tastes and feelings. As you do, feel your body being nourished and healed. Feel the calm that surrounds you. Breathe in all the good. Breathe out any discomfort or tension. 7. Sleep Enough If you get less than seven  to eight hours of sleep, your body won't tolerate stress as well as it could. If stress keeps you up at night, address the cause, and add extra meditation into your day to make up for the lost z's. Try to get seven to nine hours of sleep each night. Make a regular bedtime schedule. Keep your room dark and cool. Try to avoid computers, TV, cell phones and tablets before bed. 8. Bond with Connections You Enjoy Go out for a coffee with a friend, chat with a neighbor, call a family member, visit with a clergy member, or even hang out with your pet. Clinical studies show that spending even a short time with a companion animal can cut anxiety levels almost in half. 9. Take a Vacation Getting away from it all can reset your stress tolerance by increasing your mental and emotional outlook, which makes you a happier, more productive person upon return. Leave your cellphone and laptop at home! 10. See a Veterinary surgeon, Educational psychologist If  negative thoughts overwhelm your ability to make positive changes, it's time to seek professional help. Make an appointment today--your health and life are worth it."     24- Hour Availability:    [facility]  [address] [place], [place] [phone] Crisis [phone]   [facility] [phone]   [place] [facility]  [phone]    [facility]  [phone] (after hours)   Therapeutic Alternative/Mobile Crisis   [phone]   [place] National Suicide Hotline  ([phone] Len Childs) [place] [address]   Call 911 or go to emergency room   [facility]  [phone]);  [place] and [employer]  [phone]); [place], [place], [place], [place], [place], [place], [place]         Follow up goal  Dickie La, [name], MSW, [employer] Managed Medicaid LCSW [facility]  [facility] [email or web address] Phone: [phone]

## 2023-04-08 ENCOUNTER — Other Ambulatory Visit: Payer: Self-pay | Admitting: *Deleted

## 2023-04-08 NOTE — Patient Outreach (Signed)
Medicaid Managed Care   Nurse Care Manager Note  04/08/2023 Name:  Chris [name] MRN:  [id number] DOB:  1961-04-20  Chris [name] is an 62 y.o. year old male who is a primary patient of Chris Matar, MD.  The [facility] Managed Care Coordination team was consulted for assistance with:    COPD Pain  Chris [name] was given information about Medicaid Managed Care Coordination team services today. Chris [name] Patient agreed to services and verbal consent obtained.  Engaged with patient by telephone for follow up visit in response to provider referral for case management and/or care coordination services.   Assessments/Interventions:  Review of past medical history, allergies, medications, health status, including review of consultants reports, laboratory and other test data, was performed as part of comprehensive evaluation and provision of chronic care management services.  SDOH (Social Determinants of Health) assessments and interventions performed: SDOH Interventions    Flowsheet Row Patient Outreach Telephone from 03/30/2023 in [place] POPULATION HEALTH DEPARTMENT Patient Outreach Telephone from 03/09/2023 in [place] POPULATION HEALTH DEPARTMENT Patient Outreach Telephone from 03/04/2023 in [place] POPULATION HEALTH DEPARTMENT Patient Outreach Telephone from 01/17/2023 in [place] POPULATION HEALTH DEPARTMENT Telephone from 12/23/2022 in [place] POPULATION HEALTH DEPARTMENT Patient Outreach Telephone from 12/17/2022 in [place] POPULATION HEALTH DEPARTMENT  SDOH Interventions        Food Insecurity Interventions -- Intervention Not Indicated -- -- -- --  Housing Interventions -- Intervention Not Indicated -- -- -- --  Transportation Interventions -- -- -- -- Payor Benefit --  Financial Strain Interventions -- -- Other (Comment)  [facility] [name] involved and has a F/U appt] -- -- --  Stress Interventions Offered [employer], Provide Counseling  [Pt reports  his BH meds continues to help with anxiety and sleep but he is struggling with pain in his hip at this time which led to a recent ED visit] -- [employer], Provide Counseling Offered [employer], Provide Counseling -- [employer], Provide Counseling       Care Plan  No Known Allergies  Medications Reviewed Today     Reviewed by Chris Dach, RN (Registered Nurse) on 04/08/23 at 1136  Med List Status: <None>   Medication Order Taking? Sig Documenting Provider Last Dose Status Informant  Accu-Chek Softclix Lancets lancets [id number] Yes Use as instructed Chris Matar, MD Taking Active Self  acetaminophen (TYLENOL) 500 MG tablet [id number] Yes Take 1-2 tablets (500-1,000 mg total) by mouth every 6 (six) hours as needed (pain.). Gerhard Munch, MD Taking Active   albuterol (PROVENTIL) (2.5 MG/3ML) 0.083% nebulizer solution [id number]  Take 3 mLs (2.5 mg total) by nebulization every 4 (four) hours as needed for wheezing or shortness of breath. Anders Simmonds, PA-C  Expired 02/04/23 2359   albuterol (VENTOLIN HFA) 108 (90 Base) MCG/ACT inhaler [id number] Yes Inhale 2 puffs into the lungs every 6 (six) hours as needed for wheezing or shortness of breath. Chris Matar, MD Taking Active   allopurinol (ZYLOPRIM) 100 MG tablet [id number] Yes TAKE 2 TABLETS (200 MG TOTAL) BY MOUTH DAILY. Anders Simmonds, [place] Taking Active            Med Note (Chris [name] A   Fri Apr 08, 2023 11:32 AM)    apixaban (ELIQUIS) 5 MG TABS tablet [id number] Yes Take 1 tablet (5 mg total) by mouth 2 (two) times daily. Laurey Morale, MD Taking Active   atorvastatin (LIPITOR) 80  MG tablet [id number] Yes Take 1 tablet (80 mg total) by mouth daily. Laurey Morale, MD Taking Active   baclofen (LIORESAL) 10 MG tablet [id number] No Take 1 tablet (10 mg total) by mouth 3 (three) times daily.  Patient not taking: Reported on 04/08/2023   Arthor Captain, PA-C Not Taking Active   Blood Glucose Monitoring Suppl (ACCU-CHEK AVIVA PLUS) w/Device KIT [id number]  1 each by Does not apply route 3 (three) times daily. [name], MD  Active   carvedilol (COREG) 12.5 MG tablet [id number] Yes Take 1 tablet (12.5 mg total) by mouth 2 (two) times daily. Laurey Morale, MD Taking Active   colchicine 0.6 MG tablet [id number] Yes SMARTSIG:0.5 Tablet(s) By Mouth 3 Times a Week [provider] Taking Active   dapagliflozin propanediol (FARXIGA) 10 MG TABS tablet [id number] Yes Take 1 tablet (10 mg total) by mouth daily. Chris Matar, MD Taking Active   fluticasone-salmeterol [facility]) [phone] MCG/ACT inhaler [id number] Yes Inhale 2 puffs into the lungs 2 (two) times daily. Raechel Chute, MD Taking Active   hydrOXYzine (ATARAX) 25 MG tablet [id number] Yes Take 2-3 tablets (50-75 mg total) by mouth at bedtime as needed. Take 1 tablet 3 times a day as needed for anxiety. Take 2 tablets at night as need for sleep Park Pope, MD Taking Active   isosorbide-hydrALAZINE (BIDIL) 20-37.5 MG tablet [id number] Yes Take 1 tablet by mouth 3 (three) times daily. [name], MD Taking Active Self  lidocaine (LIDODERM) 5 % [id number] No Place 1 patch onto the skin daily. Remove & Discard patch within 12 hours or as directed by MD  Patient not taking: Reported on 04/08/2023   Gareth Eagle, PA-C Not Taking Active     Discontinued 07/11/20 [phone] (Discontinued by provider)   mirtazapine (REMERON) 15 MG tablet [id number] Yes Take 1 tablet (15 mg total) by mouth at bedtime. Park Pope, MD Taking Active   potassium chloride (KLOR-CON M) 10 MEQ tablet [id number] Yes Take 2 tablets (20 mEq total) by mouth daily. Laurey Morale, MD Taking Active   Tiotropium Bromide Monohydrate (SPIRIVA RESPIMAT) 2.5 MCG/ACT AERS [id number] Yes Inhale 2 puffs into the lungs daily. Chris Matar, MD Taking Active   torsemide 40 MG TABS [id number] Yes Take 20 mg daily,  alternating with 40 mg daily. Gerhard Munch, MD Taking Active   traMADol Janean Sark) 50 MG tablet [id number] No Take 1 tablet (50 mg total) by mouth every 6 (six) hours as needed.  Patient not taking: Reported on 04/08/2023   Arthor Captain, PA-C Not Taking Active             Patient Active Problem List   Diagnosis Date Noted   Post traumatic stress disorder 09/23/2022   MDD (major depressive disorder), recurrent episode, moderate (HCC) 08/26/2022   Stage 3b chronic kidney disease (HCC) 08/09/2022   Hypertrophic cardiomyopathy (HCC) 08/09/2022   History of renal cell cancer 08/09/2022   Chronic heart failure with preserved ejection fraction (HFpEF) (HCC) 07/13/2022   Agitation 04/23/2022   RSV (respiratory syncytial virus pneumonia) 04/22/2022   Obesity (BMI 30-39.9) 04/21/2022   COPD exacerbation (HCC) 04/19/2022   Dyslipidemia 04/19/2022   Gout 04/19/2022   Right kidney mass 05/14/2021   CHF exacerbation (HCC) 04/14/2021   Chest pain 04/14/2021   Syncope 04/14/2021   Left-sided weakness 10/28/2020   Typical atrial flutter (HCC)    CHF (congestive heart failure) (HCC) 07/04/2020   Acute exacerbation of CHF (congestive heart failure) (HCC) 06/16/2020  Influenza vaccine refused 05/06/2020   Acute decompensated heart failure (HCC) 05/04/2020   Illiteracy 05/04/2020   Type 2 diabetes mellitus with stage 3 chronic kidney disease (HCC) 12/25/2019   Elevated troponin I level 10/26/2019   History of gout 02/01/2019   Seasonal allergic rhinitis due to pollen 02/01/2019   Nicotine use disorder 11/30/2018   Microscopic hematuria 11/30/2018   Depression 11/30/2018   Difficulty controlling anger 11/30/2018   COPD (chronic obstructive pulmonary disease) (HCC)    CKD (chronic kidney disease) stage 3, GFR 30-59 ml/min (HCC) 08/10/2018   Recurrent epistaxis 04/21/2018   Mixed hyperlipidemia 07/28/2017   Essential hypertension 07/28/2017   Chronic systolic heart failure (HCC)  47/42/5956   Cocaine abuse (HCC) 02/20/2013   Cannabis abuse 02/20/2013   Back pain, chronic 02/20/2013    Conditions to be addressed/monitored per PCP order:  COPD and Pain  Care Plan : RN Care Manager Plan of Care  Updates made by Chris Dach, RN since 04/08/2023 12:00 AM     Problem: Health Management Needs Related to Heart Failure and Chronic Pain   Priority: High  Note:   Current Barriers:    RNCM Clinical Goal(s):  Patient will take all medications exactly as prescribed and will call provider for medication related questions as evidenced by medication review and patient interview demonstrate Ongoing adherence to prescribed treatment plan for CHF as evidenced by medication review continue to work with RN Care Manager to address care management and care coordination needs related to  CHF as evidenced by adherence to CM Team Scheduled appointments work with Child psychotherapist to address  related to the management of Limited social support, Transportation, and Lack of essential utilities - light/power* related to the management of CHF as evidenced by review of EMR and patient or Child psychotherapist report through collaboration with Medical illustrator, provider, and care team.   Interventions: Collaboration with LCSW/[name]/Care Guide and Heart Failure RN Inter-disciplinary care team collaboration (see longitudinal plan of care) Evaluation of current treatment plan related to  self management and patient's adherence to plan as established by provider   Heart Failure Interventions:  (Status:  New goal.) Long Term Goal Discussed importance of daily weight and advised patient to weigh and record daily Reviewed role of diuretics in prevention of fluid overload and management of heart failure; Discussed the importance of keeping all appointments with provider Assessed social determinant of health barriers  Discussed importance of self assessment reporting of swelling, increased shortness of breath,  chest pain, dizziness, or other new or worsened symptom  Pain Interventions:  (Status:  New goal.) Short Term Goal Pain assessment performed Medications reviewed Reviewed provider established plan for pain management Discussed importance of adherence to all scheduled medical appointments Counseled on the importance of reporting any/all new or changed pain symptoms or management strategies to pain management provider Reviewed with patient prescribed pharmacological and nonpharmacological pain relief strategies Assessed social determinant of health barriers Advised patient that the CM team would discuss pain management needs with provider and assist with connection to pain management provider  Patient Goals/Self-Care Activities: Take all medications as prescribed Attend all scheduled provider appointments Call provider office for new concerns or questions  Work with the social worker to address care coordination needs and will continue to work with the clinical team to address health care and disease management related needs call 1-800-273-TALK (toll free, 24 hour hotline) if experiencing a Mental Health or Behavioral Health Crisis   Follow Up Plan:  Telephone  follow up appointment with care management team member scheduled for:  07/27/22 10am      Long-Range Goal: Independent self health management of Heart Failure and Chronic Pain   Start Date: 07/23/2022  Expected End Date: 05/13/2023  Recent Progress: On track  Priority: High  Note:   Current Barriers:  Knowledge Deficits related to plan of care for management of CHF and Chronic Pain Care Coordination needs related to Transportation  Non-adherence to prescribed medication regimen Difficulty obtaining medications  RNCM Clinical Goal(s):  Patient will take all medications exactly as prescribed and will call provider for medication related questions as evidenced by medication review and patient interview demonstrate Ongoing adherence  to prescribed treatment plan for CHF as evidenced by medication review continue to work with RN Care Manager to address care management and care coordination needs related to  CHF as evidenced by adherence to CM Team Scheduled appointments work with Child psychotherapist to address  related to the management of Limited social support, Transportation, and Lack of essential utilities - light/power* related to the management of CHF as evidenced by review of EMR and patient or Child psychotherapist report through collaboration with Medical illustrator, provider, and care team.   Interventions: Inter-disciplinary care team collaboration (see longitudinal plan of care) Evaluation of current treatment plan related to  self management and patient's adherence to plan as established by provider Reviewed upcoming appointments including:  04/12/23 with [facility] and 04/28/23 with Dr. Laural Benes Advised patient to take all medications with him to upcoming provider appointments Medication reviewed, advised patient to request Lidoderm patch for pain from [facility] Ensured patient will arrange transportation to upcoming appointments   COPD Interventions:  (Status:  Goal on track:  Yes.) Long Term Goal Advised patient to track and manage COPD triggers Advised patient to engage in light exercise as tolerated 3-5 days a week to aid in the the management of COPD Provided education about and advised patient to utilize infection prevention strategies to reduce risk of respiratory infection Discussed the importance of adequate rest and management of fatigue with COPD Assessed social determinant of health barriers Advised patient on smoking cessation, provided education Congratulated patient on cutting back on smoking, plan to work on smoking cessation goal during next visit-down to 1 pk/week Advised patient to attend upcoming Pulmonology appointment on 03/28/23 Reviewed provider notes and discussed   Pain Interventions:   (Status:  New) Long Term Goal-Left hip pain 10/10, seen in the ED on 10/11-10/12 Pain assessment performed Medications reviewed: advised patient to request Lidoderm patch from [facility] Reviewed provider established plan for pain management Discussed importance of adherence to all scheduled medical appointments Counseled on the importance of reporting any/all new or changed pain symptoms or management strategies to pain management provider Reviewed with patient prescribed pharmacological and nonpharmacological pain relief strategies Assessed social determinant of health barriers Assisted patient with scheduling with Orthopedic provider-Scheduled for 04/12/23 at 10am Advised patient of the walk in Orthopedic clinic at [facility]  Patient Goals/Self-Care Activities: Take all medications as prescribed Attend all scheduled provider appointments Call provider office for new concerns or questions  Work with the social worker to address care coordination needs and will continue to work with the clinical team to address health care and disease management related needs call 1-800-273-TALK (toll free, 24 hour hotline) if experiencing a Mental Health or Behavioral Health Crisis   Follow Up Plan:  Telephone follow up appointment with care management team member scheduled for:  05/09/23 @ 11:15  am      Follow Up:  Patient agrees to Care Plan and Follow-up.  Plan: The Managed Medicaid care management team will reach out to the patient again over the next 30 days.  Date/time of next scheduled RN care management/care coordination outreach:  05/09/23 at 11:15am  Estanislado Emms RN, BSN [place]  [facility] RN Care Coordinator [phone]

## 2023-04-08 NOTE — Patient Instructions (Signed)
Visit Information  Chris [name] was given information about Medicaid Managed Care team care coordination services as a part of their Healthy [facility] Medicaid benefit. Chris [name] verbally consented to engagement with the [facility] Managed Care team.   If you are experiencing a medical emergency, please call 911 or report to your local emergency department or urgent care.   If you have a non-emergency medical problem during routine business hours, please contact your provider's office and ask to speak with a nurse.   For questions related to your Healthy [facility] health plan, please call: [phone] or visit the homepage here: [email or web address]  If you would like to schedule transportation through your Healthy [facility] plan, please call the following number at least 2 days in advance of your appointment: [phone]  For information about your ride after you set it up, call Ride Assist at [phone]. Use this number to activate a Will Call pickup, or if your transportation is late for a scheduled pickup. Use this number, too, if you need to make a change or cancel a previously scheduled reservation.  If you need transportation services right away, call [phone]. The after-hours call center is staffed 24 hours to handle ride assistance and urgent reservation requests (including discharges) 365 days a year. Urgent trips include sick visits, hospital discharge requests and life-sustaining treatment.  Call the [facility] Line at [phone], at any time, 24 hours a day, 7 days a week. If you are in danger or need immediate medical attention call 911.  If you would like help to quit smoking, call 1-800-QUIT-NOW (437-727-2764) OR Espaol: 1-855-Djelo-Ya (4-742-595-6387) o para ms informacin haga clic aqu or Text READY to 564-332 to register via text  Chris [name],   Please see education materials related to pain management  provided by MyChart link.  Patient verbalizes understanding of instructions and care plan provided today and agrees to view in MyChart. Active MyChart status and patient understanding of how to access instructions and care plan via MyChart confirmed with patient.     Telephone follow up appointment with Managed Medicaid care management team member scheduled for:05/09/23 at 11:15am  Estanislado Emms RN, BSN [place]  [facility] RN Care Coordinator [phone]   Following is a copy of your plan of care:  Care Plan : RN Care Manager Plan of Care  Updates made by [name], RN since 04/08/2023 12:00 AM     Problem: Health Management Needs Related to Heart Failure and Chronic Pain   Priority: High  Note:   Current Barriers:    RNCM Clinical Goal(s):  Patient will take all medications exactly as prescribed and will call provider for medication related questions as evidenced by medication review and patient interview demonstrate Ongoing adherence to prescribed treatment plan for CHF as evidenced by medication review continue to work with RN Care Manager to address care management and care coordination needs related to  CHF as evidenced by adherence to CM Team Scheduled appointments work with social worker to address  related to the management of Limited social support, Transportation, and Lack of essential utilities - light/power* related to the management of CHF as evidenced by review of EMR and patient or Child psychotherapist report through collaboration with Medical illustrator, provider, and care team.   Interventions: Collaboration with LCSW/[name]/Care Guide and Heart Failure RN Inter-disciplinary care team collaboration (see longitudinal plan of care) Evaluation of current treatment plan related to  self management and patient's adherence to  plan as established by provider   Heart Failure Interventions:  (Status:  New goal.) Long Term Goal Discussed  importance of daily weight and advised patient to weigh and record daily Reviewed role of diuretics in prevention of fluid overload and management of heart failure; Discussed the importance of keeping all appointments with provider Assessed social determinant of health barriers  Discussed importance of self assessment reporting of swelling, increased shortness of breath, chest pain, dizziness, or other new or worsened symptom  Pain Interventions:  (Status:  New goal.) Short Term Goal Pain assessment performed Medications reviewed Reviewed provider established plan for pain management Discussed importance of adherence to all scheduled medical appointments Counseled on the importance of reporting any/all new or changed pain symptoms or management strategies to pain management provider Reviewed with patient prescribed pharmacological and nonpharmacological pain relief strategies Assessed social determinant of health barriers Advised patient that the CM team would discuss pain management needs with provider and assist with connection to pain management provider  Patient Goals/Self-Care Activities: Take all medications as prescribed Attend all scheduled provider appointments Call provider office for new concerns or questions  Work with the social worker to address care coordination needs and will continue to work with the clinical team to address health care and disease management related needs call 1-800-273-TALK (toll free, 24 hour hotline) if experiencing a Mental Health or Behavioral Health Crisis   Follow Up Plan:  Telephone follow up appointment with care management team member scheduled for:  07/27/22 10am      Long-Range Goal: Independent self health management of Heart Failure and Chronic Pain   Start Date: 07/23/2022  Expected End Date: 05/13/2023  Recent Progress: On track  Priority: High  Note:   Current Barriers:  Knowledge Deficits related to plan of care for management of CHF  and Chronic Pain Care Coordination needs related to Transportation  Non-adherence to prescribed medication regimen Difficulty obtaining medications  RNCM Clinical Goal(s):  Patient will take all medications exactly as prescribed and will call provider for medication related questions as evidenced by medication review and patient interview demonstrate Ongoing adherence to prescribed treatment plan for CHF as evidenced by medication review continue to work with RN Care Manager to address care management and care coordination needs related to  CHF as evidenced by adherence to CM Team Scheduled appointments work with Child psychotherapist to address  related to the management of Limited social support, Transportation, and Lack of essential utilities - light/power* related to the management of CHF as evidenced by review of EMR and patient or Child psychotherapist report through collaboration with Medical illustrator, provider, and care team.   Interventions: Inter-disciplinary care team collaboration (see longitudinal plan of care) Evaluation of current treatment plan related to  self management and patient's adherence to plan as established by provider Reviewed upcoming appointments including:  04/12/23 with [facility] and 04/28/23 with Dr. Laural Benes Advised patient to take all medications with him to upcoming provider appointments Medication reviewed, advised patient to request Lidoderm patch for pain from [facility] Ensured patient will arrange transportation to upcoming appointments   COPD Interventions:  (Status:  Goal on track:  Yes.) Long Term Goal Advised patient to track and manage COPD triggers Advised patient to engage in light exercise as tolerated 3-5 days a week to aid in the the management of COPD Provided education about and advised patient to utilize infection prevention strategies to reduce risk of respiratory infection Discussed the importance of adequate rest and management of  fatigue with  COPD Assessed social determinant of health barriers Advised patient on smoking cessation, provided education Congratulated patient on cutting back on smoking, plan to work on smoking cessation goal during next visit-down to 1 pk/week Advised patient to attend upcoming Pulmonology appointment on 03/28/23 Reviewed provider notes and discussed   Pain Interventions:  (Status:  New) Long Term Goal-Left hip pain 10/10, seen in the ED on 10/11-10/12 Pain assessment performed Medications reviewed: advised patient to request Lidoderm patch from [facility] Reviewed provider established plan for pain management Discussed importance of adherence to all scheduled medical appointments Counseled on the importance of reporting any/all new or changed pain symptoms or management strategies to pain management provider Reviewed with patient prescribed pharmacological and nonpharmacological pain relief strategies Assessed social determinant of health barriers Assisted patient with scheduling with Orthopedic provider-Scheduled for 04/12/23 at 10am Advised patient of the walk in Orthopedic clinic at [facility]  Patient Goals/Self-Care Activities: Take all medications as prescribed Attend all scheduled provider appointments Call provider office for new concerns or questions  Work with the social worker to address care coordination needs and will continue to work with the clinical team to address health care and disease management related needs call 1-800-273-TALK (toll free, 24 hour hotline) if experiencing a Mental Health or Behavioral Health Crisis   Follow Up Plan:  Telephone follow up appointment with care management team member scheduled for:  05/09/23 @ 11:15 am

## 2023-04-11 ENCOUNTER — Ambulatory Visit (HOSPITAL_BASED_OUTPATIENT_CLINIC_OR_DEPARTMENT_OTHER): Payer: Medicaid Other | Admitting: Student

## 2023-04-12 ENCOUNTER — Encounter (HOSPITAL_BASED_OUTPATIENT_CLINIC_OR_DEPARTMENT_OTHER): Payer: Self-pay | Admitting: Student

## 2023-04-12 ENCOUNTER — Telehealth: Payer: Self-pay | Admitting: Student

## 2023-04-12 ENCOUNTER — Ambulatory Visit (HOSPITAL_BASED_OUTPATIENT_CLINIC_OR_DEPARTMENT_OTHER): Payer: Medicaid Other

## 2023-04-12 ENCOUNTER — Encounter (HOSPITAL_BASED_OUTPATIENT_CLINIC_OR_DEPARTMENT_OTHER): Payer: Self-pay

## 2023-04-12 ENCOUNTER — Ambulatory Visit (HOSPITAL_BASED_OUTPATIENT_CLINIC_OR_DEPARTMENT_OTHER): Payer: Medicaid Other | Admitting: Student

## 2023-04-12 DIAGNOSIS — M5441 Lumbago with sciatica, right side: Secondary | ICD-10-CM

## 2023-04-12 DIAGNOSIS — M5442 Lumbago with sciatica, left side: Secondary | ICD-10-CM | POA: Diagnosis not present

## 2023-04-12 DIAGNOSIS — G8929 Other chronic pain: Secondary | ICD-10-CM | POA: Diagnosis not present

## 2023-04-12 MED ORDER — TRAMADOL HCL 50 MG PO TABS
50.0000 mg | ORAL_TABLET | Freq: Four times a day (QID) | ORAL | 0 refills | Status: AC | PRN
Start: 2023-04-12 — End: 2023-04-17

## 2023-04-12 NOTE — Progress Notes (Signed)
Chief Complaint: Left hip pain     History of Present Illness:    Chris [name] is a 62 y.o. male presenting today for evaluation of posterior left hip pain.  He states that this began 1 month ago without any known injury.  He does recall a lower back injury many years ago while weightlifting.  Pain today is located in the left side of the lower back and posterior hip which he rates as severe.  He does have pain, numbness, and tingling radiating down the back of the left leg to the foot.  The symptoms began roughly 3 months ago.  He was seen in the emergency department on 10/11 with his current symptoms and was prescribed lidocaine patches and given ibuprofen 800 mg which has not helped.  He is on daily Eliquis 10 mg.  Has tried Tylenol as well but states that Percocet has been the only medication give some relief.  Denies any previous injections or physical therapy.  Does also report today that he has noticed a palpable mass over the left lateral flank.  This is slightly tender to palpation but he is concerned that it could be contributing to his symptoms.   Surgical History:   None  PMH/PSH/Family History/Social History/Meds/Allergies:    Past Medical History:  Diagnosis Date   Arrhythmia    atrial flutter   CHF (congestive heart failure) (HCC)    Chronic kidney disease    COPD (chronic obstructive pulmonary disease) (HCC)    Coronary artery disease    Depression    Diabetes mellitus without complication (HCC)    GERD (gastroesophageal reflux disease)    Gout    Hypertension    Influenza A with respiratory manifestations    Mental disorder    Past Surgical History:  Procedure Laterality Date   ANKLE SURGERY     CARDIAC CATHETERIZATION     CARDIOVERSION N/A 07/08/2020   Procedure: CARDIOVERSION;  Surgeon: Lamar Blinks, MD;  Location: [facility] ORS;  Service: Cardiovascular;  Laterality: N/A;   COLONOSCOPY WITH PROPOFOL N/A 11/19/2021    Procedure: COLONOSCOPY WITH PROPOFOL;  Surgeon: Jenel Lucks, MD;  Location: [facility];  Service: Gastroenterology;  Laterality: N/A;   HERNIA REPAIR     x2   IR RADIOLOGIST EVAL & MGMT  05/13/2022   IR RADIOLOGIST EVAL & MGMT  10/27/2022   IR RADIOLOGIST EVAL & MGMT  03/02/2023   POLYPECTOMY  11/19/2021   Procedure: POLYPECTOMY;  Surgeon: Jenel Lucks, MD;  Location: [facility];  Service: Gastroenterology;;   RADIOLOGY WITH ANESTHESIA N/A 06/23/2022   Procedure: Right renal tumor ablation;  Surgeon: Bennie Dallas, MD;  Location[facility] OR;  Service: Radiology;  Laterality: N/A;   RIGHT HEART CATH N/A 07/13/2022   Procedure: RIGHT HEART CATH;  Surgeon: Laurey Morale, MD;  Location: [facility] INVASIVE CV LAB;  Service: Cardiovascular;  Laterality: N/A;   RIGHT/LEFT HEART CATH AND CORONARY ANGIOGRAPHY N/A 11/19/2022   Procedure: RIGHT/LEFT HEART CATH AND CORONARY ANGIOGRAPHY;  Surgeon: Laurey Morale, MD;  Location: [facility] INVASIVE CV LAB;  Service: Cardiovascular;  Laterality: N/A;   SHOULDER SURGERY     TEE WITHOUT CARDIOVERSION N/A 07/08/2020   Procedure: TRANSESOPHAGEAL ECHOCARDIOGRAM (TEE);  Surgeon: Lamar Blinks, MD;  Location: [facility] ORS;  Service: Cardiovascular;  Laterality: N/A;  Social History   Socioeconomic History   Marital status: Divorced    Spouse name: Not on file   Number of children: 3   Years of education: Not on file   Highest education level: High school graduate  Occupational History   Occupation: disability  Tobacco Use   Smoking status: Every Day    Current packs/day: 1.00    Average packs/day: 1 pack/day for 43.0 years (43.0 ttl pk-yrs)    Types: Cigarettes   Smokeless tobacco: Never   Tobacco comments:        6 cigs daily--03/28/2023  Vaping Use   Vaping status: Never Used  Substance and Sexual Activity   Alcohol use: Yes    Alcohol/week: 4.0 standard drinks of alcohol    Types: 4 Shots of liquor per week   Drug use: Yes    Frequency: 21.0  times per week    Types: Marijuana    Comment: last use Cocaine- 03/28/2021. Still using marijuana, last use 06/22/21   Sexual activity: Not on file  Other Topics Concern   Not on file  Social History Narrative   ** Merged History Encounter **       Social Determinants of Health   Financial Resource Strain: Medium Risk (03/04/2023)   Overall Financial Resource Strain (CARDIA)    Difficulty of Paying Living Expenses: Somewhat hard  Food Insecurity: No Food Insecurity (03/09/2023)   Hunger Vital Sign    Worried About Running Out of Food in the Last Year: Never true    Ran Out of Food in the Last Year: Never true  Transportation Needs: Unmet Transportation Needs (02/03/2023)   PRAPARE - Transportation    Lack of Transportation (Medical): Yes    Lack of Transportation (Non-Medical): Yes  Physical Activity: Insufficiently Active (11/16/2021)   Exercise Vital Sign    Days of Exercise per Week: 5 days    Minutes of Exercise per Session: 20 min  Stress: No Stress Concern Present (03/30/2023)   [employer] of Occupational Health - Occupational Stress Questionnaire    Feeling of Stress : Only a little  Recent Concern: Stress - Stress Concern Present (01/17/2023)   [employer] of Occupational Health - Occupational Stress Questionnaire    Feeling of Stress : To some extent  Social Connections: Moderately Isolated (07/23/2022)   Social Connection and Isolation Panel [NHANES]    Frequency of Communication with Friends and Family: Twice a week    Frequency of Social Gatherings with Friends and Family: Once a week    Attends Religious Services: More than 4 times per year    Active Member of [employer] or Organizations: No    Attends Engineer, structural: Never    Marital Status: Divorced   Family History  Problem Relation Age of Onset   Heart disease Father    Diabetes Mother    HIV Brother    Healthy Son    Healthy Daughter    No Known Allergies Current Outpatient Medications   Medication Sig Dispense Refill   Accu-Chek Softclix Lancets lancets Use as instructed 100 each 12   acetaminophen (TYLENOL) 500 MG tablet Take 1-2 tablets (500-1,000 mg total) by mouth every 6 (six) hours as needed (pain.). 30 tablet 0   albuterol (PROVENTIL) (2.5 MG/3ML) 0.083% nebulizer solution Take 3 mLs (2.5 mg total) by nebulization every 4 (four) hours as needed for wheezing or shortness of breath. 300 mL 2   albuterol (VENTOLIN HFA) 108 (90 Base) MCG/ACT inhaler Inhale 2 puffs  into the lungs every 6 (six) hours as needed for wheezing or shortness of breath. 54 g 6   allopurinol (ZYLOPRIM) 100 MG tablet TAKE 2 TABLETS (200 MG TOTAL) BY MOUTH DAILY. 60 tablet 2   apixaban (ELIQUIS) 5 MG TABS tablet Take 1 tablet (5 mg total) by mouth 2 (two) times daily. 60 tablet 11   atorvastatin (LIPITOR) 80 MG tablet Take 1 tablet (80 mg total) by mouth daily. 90 tablet 1   baclofen (LIORESAL) 10 MG tablet Take 1 tablet (10 mg total) by mouth 3 (three) times daily. (Patient not taking: Reported on 04/08/2023) 30 each 0   Blood Glucose Monitoring Suppl (ACCU-CHEK AVIVA PLUS) w/Device KIT 1 each by Does not apply route 3 (three) times daily. 1 kit 0   carvedilol (COREG) 12.5 MG tablet Take 1 tablet (12.5 mg total) by mouth 2 (two) times daily. 180 tablet 3   colchicine 0.6 MG tablet SMARTSIG:0.5 Tablet(s) By Mouth 3 Times a Week     dapagliflozin propanediol (FARXIGA) 10 MG TABS tablet Take 1 tablet (10 mg total) by mouth daily. 90 tablet 3   fluticasone-salmeterol (ADVAIR HFA) 230-21 MCG/ACT inhaler Inhale 2 puffs into the lungs 2 (two) times daily. 1 each 12   hydrOXYzine (ATARAX) 25 MG tablet Take 2-3 tablets (50-75 mg total) by mouth at bedtime as needed. Take 1 tablet 3 times a day as needed for anxiety. Take 2 tablets at night as need for sleep 120 tablet 2   isosorbide-hydrALAZINE (BIDIL) 20-37.5 MG tablet Take 1 tablet by mouth 3 (three) times daily. 270 tablet 3   lidocaine (LIDODERM) 5 % Place 1  patch onto the skin daily. Remove & Discard patch within 12 hours or as directed by MD (Patient not taking: Reported on 04/08/2023) 30 patch 0   mirtazapine (REMERON) 15 MG tablet Take 1 tablet (15 mg total) by mouth at bedtime. 30 tablet 1   potassium chloride (KLOR-CON M) 10 MEQ tablet Take 2 tablets (20 mEq total) by mouth daily. 180 tablet 3   Tiotropium Bromide Monohydrate (SPIRIVA RESPIMAT) 2.5 MCG/ACT AERS Inhale 2 puffs into the lungs daily. 4 g 6   torsemide 40 MG TABS Take 20 mg daily, alternating with 40 mg daily. 30 tablet 0   traMADol (ULTRAM) 50 MG tablet Take 1 tablet (50 mg total) by mouth every 6 (six) hours as needed. (Patient not taking: Reported on 04/08/2023) 15 tablet 0   No current facility-administered medications for this visit.   No results found.  Review of Systems:   A ROS was performed including pertinent positives and negatives as documented in the HPI.  Physical Exam :   Constitutional: NAD and appears stated age Neurological: Alert and oriented Psych: Appropriate affect and cooperative There were no vitals taken for this visit.   Comprehensive Musculoskeletal Exam:    Positive for tenderness over the lumbar spine and left lumbar musculature down through the left glutes.  Passive left hip range of motion limited by pain to 100 degrees flexion, 20 degrees external rotation, and 10 degrees internal rotation.  Negative straight leg raise bilaterally.  Left knee flexion and extension strength 4/5 compared to 5/5 of contralateral side.  Full and equal ankle dorsiflexion/plantarflexion strength bilaterally.  Palpable, irregular mass noted in left lateral flank just above the belt line.  Imaging:   Xray (lumbar spine 4 views): Possible wedge fracture of L1 vertebral body with decreased disc space between L1 and L2.  Xray review from 03/25/2023 (left femur  2 views): No evidence of left hip osteoarthritis.   I personally reviewed and interpreted the  radiographs.   Assessment:   62 y.o. male with acute posterior left hip pain without any recent injury.  Pain does appear to be emanating from the lower back and is associated with sciatica down the entirety of the left leg.  There does appear to be evidence of an L1 vertebral wedge fracture with undetermined chronicity.  Given this as well as his pain and radicular symptoms I do believe that it would be indicated to obtain an MRI scan of the lumbar spine.  He is also concerned about a mass in his left lateral flank area.  I did visualize this under ultrasound which revealed an irregular shaped, anechoic mass.  Plan to also obtain a left hip MRI to further evaluate this as well as any underlying muscular or tendinous pathology.  I will also send him a few days of tramadol for pain relief although I am cautious given his drug history.  Will refer for follow-up with Ellin Goodie for MRI review of the lumbar spine and discussion of further management.  Plan :    - Obtain MRI of the lumbar spine and right hip - Follow up with Ellin Goodie, NP for MRI review and treatment discussion     I personally saw and evaluated the patient, and participated in the management and treatment plan.  Hazle Nordmann, PA-C Orthopedics

## 2023-04-12 NOTE — Telephone Encounter (Signed)
Patient called. Says the Tramadol wasn't called in. His call back # is [phone]

## 2023-04-12 NOTE — Telephone Encounter (Signed)
Lvm advising  

## 2023-04-19 ENCOUNTER — Other Ambulatory Visit: Payer: Self-pay

## 2023-04-19 NOTE — Patient Instructions (Signed)
Visit Information  Chris [name] was given information about Medicaid Managed Care team care coordination services as a part of their Healthy [facility] Medicaid benefit. Chris [name] verbally consented to engagement with the [facility] Managed Care team.   If you are experiencing a medical emergency, please call 911 or report to your local emergency department or urgent care.   If you have a non-emergency medical problem during routine business hours, please contact your provider's office and ask to speak with a nurse.   For questions related to your Healthy [facility] health plan, please call: [phone] or visit the homepage here: [email or web address]  If you would like to schedule transportation through your Healthy [facility] plan, please call the following number at least 2 days in advance of your appointment: [phone]  For information about your ride after you set it up, call Ride Assist at [phone]. Use this number to activate a Will Call pickup, or if your transportation is late for a scheduled pickup. Use this number, too, if you need to make a change or cancel a previously scheduled reservation.  If you need transportation services right away, call [phone]. The after-hours call center is staffed 24 hours to handle ride assistance and urgent reservation requests (including discharges) 365 days a year. Urgent trips include sick visits, hospital discharge requests and life-sustaining treatment.  Call the [facility] Line at [phone], at any time, 24 hours a day, 7 days a week. If you are in danger or need immediate medical attention call 911.  If you would like help to quit smoking, call 1-800-QUIT-NOW (510-592-6587) OR Espaol: 1-855-Djelo-Ya (5-188-416-6063) o para ms informacin haga clic aqu or Text READY to 016-010 to register via text  Chris [name] - following are the goals we discussed in your visit today:    Goals Addressed   None      Social Worker will follow up on 06/19/22.   [name], [name], MHA [facility]  Managed Medicaid Social Worker [phone]   Following is a copy of your plan of care:  There are no care plans that you recently modified to display for this patient.

## 2023-04-19 NOTE — Patient Outreach (Signed)
Medicaid Managed Care Social Work Note  04/19/2023 Name:  Chris [name] MRN:  [id number] DOB:  Feb 01, 1961  Chris [name] is an 62 y.o. year old male who is a primary patient of [name], MD.  The Medicaid Managed Care Coordination team was consulted for assistance with:   housing repair  [name] was given information about Medicaid Managed Care Coordination team services today. [name] Patient agreed to services and verbal consent obtained.  Engaged with patient  for by telephone forfollow up visit in response to referral for case management and/or care coordination services.   Assessments/Interventions:  Review of past medical history, allergies, medications, health status, including review of consultants reports, laboratory and other test data, was performed as part of comprehensive evaluation and provision of chronic care management services.  SDOH: (Social Determinant of Health) assessments and interventions performed: SDOH Interventions    Flowsheet Row Patient Outreach Telephone from 03/30/2023 in [place] POPULATION HEALTH DEPARTMENT Patient Outreach Telephone from 03/09/2023 in [place] POPULATION HEALTH DEPARTMENT Patient Outreach Telephone from 03/04/2023 in [place] POPULATION HEALTH DEPARTMENT Patient Outreach Telephone from 01/17/2023 in [place] POPULATION HEALTH DEPARTMENT Telephone from 12/23/2022 in [place] POPULATION HEALTH DEPARTMENT Patient Outreach Telephone from 12/17/2022 in [place] POPULATION HEALTH DEPARTMENT  SDOH Interventions        Food Insecurity Interventions -- Intervention Not Indicated -- -- -- --  Housing Interventions -- Intervention Not Indicated -- -- -- --  Transportation Interventions -- -- -- -- Payor Benefit --  Financial Strain Interventions -- -- Other (Comment)  [name] [name] involved and has a F/U appt] -- -- --  Stress Interventions Offered [employer], Provide Counseling  [Pt reports his BH meds  continues to help with anxiety and sleep but he is struggling with pain in his hip at this time which led to a recent ED visit] -- [employer], Provide Counseling Offered [employer], Provide Counseling -- [employer], Provide Counseling     [name] completed a telephone outreach with patient, he states he did receive the resources [name] sent him, but they will not complete the repairs due to patient not being the owner of the home, [name] encouraged patient to speak with his sister (owner of the home) to have her call. No additional resources are needed at this time.  Advanced Directives Status:  Not addressed in this encounter.  Care Plan                 No Known Allergies  Medications Reviewed Today   Medications were not reviewed in this encounter     Patient Active Problem List   Diagnosis Date Noted   Post traumatic stress disorder 09/23/2022   MDD (major depressive disorder), recurrent episode, moderate (HCC) 08/26/2022   Stage 3b chronic kidney disease (HCC) 08/09/2022   Hypertrophic cardiomyopathy (HCC) 08/09/2022   History of renal cell cancer 08/09/2022   Chronic heart failure with preserved ejection fraction (HFpEF) (HCC) 07/13/2022   Agitation 04/23/2022   RSV (respiratory syncytial virus pneumonia) 04/22/2022   Obesity (BMI 30-39.9) 04/21/2022   COPD exacerbation (HCC) 04/19/2022   Dyslipidemia 04/19/2022   Gout 04/19/2022   Right kidney mass 05/14/2021   CHF exacerbation (HCC) 04/14/2021   Chest pain 04/14/2021   Syncope 04/14/2021   Left-sided weakness 10/28/2020   Typical atrial flutter (HCC)    CHF (congestive heart failure) (HCC) 07/04/2020   Acute exacerbation of CHF (congestive heart failure) (  HCC) 06/16/2020   Influenza vaccine refused 05/06/2020   Acute decompensated heart failure (HCC) 05/04/2020   Illiteracy 05/04/2020   Type 2 diabetes mellitus with stage 3 chronic kidney disease (HCC)  12/25/2019   Elevated troponin I level 10/26/2019   History of gout 02/01/2019   Seasonal allergic rhinitis due to pollen 02/01/2019   Nicotine use disorder 11/30/2018   Microscopic hematuria 11/30/2018   Depression 11/30/2018   Difficulty controlling anger 11/30/2018   COPD (chronic obstructive pulmonary disease) (HCC)    CKD (chronic kidney disease) stage 3, GFR 30-59 ml/min (HCC) 08/10/2018   Recurrent epistaxis 04/21/2018   Mixed hyperlipidemia 07/28/2017   Essential hypertension 07/28/2017   Chronic systolic heart failure (HCC) 10/25/2014   Cocaine abuse (HCC) 02/20/2013   Cannabis abuse 02/20/2013   Back pain, chronic 02/20/2013    Conditions to be addressed/monitored per PCP order:   housing repairs  There are no care plans that you recently modified to display for this patient.   Follow up:  Patient agrees to Care Plan and Follow-up.  Plan: The Managed Medicaid care management team will reach out to the patient again over the next 45-60 days.  Date/time of next scheduled Social Work care management/care coordination outreach:  06/19/22  [name], [name], [name]  [name] [phone]

## 2023-04-23 ENCOUNTER — Other Ambulatory Visit: Payer: Medicaid Other

## 2023-04-27 ENCOUNTER — Ambulatory Visit (HOSPITAL_COMMUNITY): Payer: Medicaid Other | Admitting: Mental Health

## 2023-04-28 ENCOUNTER — Ambulatory Visit: Payer: Medicaid Other | Admitting: Internal Medicine

## 2023-04-29 ENCOUNTER — Ambulatory Visit: Payer: Self-pay | Admitting: Licensed Clinical Social Worker

## 2023-04-29 ENCOUNTER — Ambulatory Visit: Payer: Medicaid Other | Admitting: Internal Medicine

## 2023-05-09 ENCOUNTER — Other Ambulatory Visit: Payer: Self-pay | Admitting: *Deleted

## 2023-05-09 NOTE — Patient Instructions (Signed)
Visit Information  [name]  - as a part of your Medicaid benefit, you are eligible for care management and care coordination services at no cost or copay. I was unable to reach you by phone today but would be happy to help you with your health related needs. Please feel free to call me @ [phone].   A member of the Managed Medicaid care management team will reach out to you again over the next 7 days.   Estanislado Emms RN, BSN [place]  [facility] RN Care Coordinator [phone]

## 2023-05-09 NOTE — Patient Outreach (Signed)
  Medicaid Managed Care   Unsuccessful Attempt Note   05/09/2023 Name: Chris [name] MRN: [id number] DOB: 1961-03-06  Referred by: [name], MD Reason for referral : High Risk Managed Medicaid (Unsuccessful RNCM follow up telephone outreach)   An unsuccessful telephone outreach was attempted today. The patient was referred to the case management team for assistance with care management and care coordination.    Follow Up Plan: A HIPAA compliant phone message was left for the patient providing contact information and requesting a return call. and The Managed Medicaid care management team will reach out to the patient again over the next 7 days.    Estanislado Emms RN, BSN [place]  [facility] RN Care Coordinator [phone]

## 2023-05-16 ENCOUNTER — Telehealth (HOSPITAL_BASED_OUTPATIENT_CLINIC_OR_DEPARTMENT_OTHER): Payer: Self-pay | Admitting: Student

## 2023-05-16 ENCOUNTER — Ambulatory Visit
Admission: RE | Admit: 2023-05-16 | Discharge: 2023-05-16 | Disposition: A | Discharge status: Home / Self Care | Payer: Medicaid Other | Source: Ambulatory Visit | Attending: Student

## 2023-05-16 ENCOUNTER — Inpatient Hospital Stay
Admission: RE | Admit: 2023-05-16 | Discharge: 2023-05-16 | Disposition: A | Discharge status: Home / Self Care | Payer: Medicaid Other | Source: Ambulatory Visit | Attending: Student

## 2023-05-16 ENCOUNTER — Other Ambulatory Visit (HOSPITAL_BASED_OUTPATIENT_CLINIC_OR_DEPARTMENT_OTHER): Payer: Self-pay | Admitting: Student

## 2023-05-16 DIAGNOSIS — M48061 Spinal stenosis, lumbar region without neurogenic claudication: Secondary | ICD-10-CM | POA: Diagnosis not present

## 2023-05-16 DIAGNOSIS — M25552 Pain in left hip: Secondary | ICD-10-CM | POA: Diagnosis not present

## 2023-05-16 DIAGNOSIS — M545 Low back pain, unspecified: Secondary | ICD-10-CM | POA: Diagnosis not present

## 2023-05-16 DIAGNOSIS — G8929 Other chronic pain: Secondary | ICD-10-CM

## 2023-05-16 DIAGNOSIS — M5126 Other intervertebral disc displacement, lumbar region: Secondary | ICD-10-CM | POA: Diagnosis not present

## 2023-05-16 MED ORDER — TRAMADOL HCL 50 MG PO TABS
50.0000 mg | ORAL_TABLET | Freq: Four times a day (QID) | ORAL | 0 refills | Status: AC | PRN
Start: 2023-05-16 — End: 2023-05-21

## 2023-05-16 NOTE — Telephone Encounter (Signed)
Will refill Tramadol but patient notified that he needs to follow up with Ellin Goodie for lumbar MRI review.  This will be last refill until then.

## 2023-05-16 NOTE — Telephone Encounter (Signed)
Patient wants meds sent to pharmacy [facility] phone [phone]  [address] [address]. Patient would like stronger meds sent than what he has for his back

## 2023-05-17 ENCOUNTER — Encounter (HOSPITAL_COMMUNITY): Payer: Self-pay | Admitting: Student

## 2023-05-17 ENCOUNTER — Other Ambulatory Visit: Payer: Self-pay | Admitting: Internal Medicine

## 2023-05-17 ENCOUNTER — Ambulatory Visit (INDEPENDENT_AMBULATORY_CARE_PROVIDER_SITE_OTHER): Payer: Medicaid Other | Admitting: Student

## 2023-05-17 DIAGNOSIS — F331 Major depressive disorder, recurrent, moderate: Secondary | ICD-10-CM | POA: Diagnosis not present

## 2023-05-17 DIAGNOSIS — F4310 Post-traumatic stress disorder, unspecified: Secondary | ICD-10-CM | POA: Diagnosis not present

## 2023-05-17 MED ORDER — MIRTAZAPINE 15 MG PO TABS: 15.0000 mg | ORAL_TABLET | Freq: Every day | ORAL | 0 refills | Status: DC

## 2023-05-17 MED ORDER — HYDROXYZINE HCL 25 MG PO TABS: ORAL_TABLET | ORAL | 0 refills | Status: DC

## 2023-05-17 MED ORDER — TRAZODONE HCL 50 MG PO TABS
100.0000 mg | ORAL_TABLET | Freq: Every day | ORAL | 0 refills | Status: DC
Start: 2023-05-17 — End: 2023-08-09

## 2023-05-17 NOTE — Progress Notes (Signed)
BH MD Outpatient Progress Note  05/17/2023 8:48 PM Chris [name]  MRN:  [id number]  Assessment:  Patient reports improved mood and anxiety since improvement in sleep.  He states that he takes hydroxyzine 50 mg and trazodone 100 mg every night and this is sufficient for him to fall asleep and maintaining sleep.  His history of obstructive sleep apnea is likely contributing to his insomnia so this will likely require monitoring and we will unlikely go further uptitrate any of his medications at this time to address further symptoms of insomnia.  No psychotropic adjustments were made this visit and patient will follow-up in about 2 to 3 months.  Identifying Information: Chris [name] is a 62 y.o. y.o. male with a history of depression, hypertension, obesity, HFrEF (initial EF 25%, normalized to 50%) atrial flutter s/p DCCV 06/2020, COPD, current smoker x40+ years, former cocaine use x25+ years, HCM, CKD who is an established patient with [facility] for medication management.   Plan: # Major Depressive Disorder-recurrent episode, moderate Past medication trials:  Status of problem: improving Interventions: -- Continue mirtazapine 15 mg at bedtime for depression, insomnia, and appetite stimulation -- Continue hydroxyzine 25 mg tid prn for anxiety -- Continue hydroxyzine 50 mg at bedtime as needed for sleep --Continue trazodone 100 mg nightly for sleep  #Nicotine use disorder --Encourage cessation   Patient was given contact information for behavioral health clinic and was instructed to call 911 for emergencies.   Subjective:  Chief Complaint:  Chief Complaint  Patient presents with   Medication Management    Interval History:  Patient reports depression and anxiety have continued to improve as he states he is sleeping better than before.  He states that his sleep regiment of hydroxyzine 50 mg and trazodone 100 mg every night has helped with sleep initiation and  maintaining sleep throughout the night.  He reports no significant changes in appetite.  He denies any significant side effects from current medication regimen and denies feeling sedated or dizzy during the daytime.  He denies SI/HI/AVH.  He is seeking to continue psychotherapy with Chris [name] and will be following up with her to continue psychotherapy.   Visit Diagnosis:    ICD-10-CM   1. MDD (major depressive disorder), recurrent episode, moderate (HCC)  F33.1 hydrOXYzine (ATARAX) 25 MG tablet    mirtazapine (REMERON) 15 MG tablet    traZODone (DESYREL) 50 MG tablet    2. Post traumatic stress disorder  F43.10 mirtazapine (REMERON) 15 MG tablet        Past Psychiatric History:  Diagnoses: none Medication trials: duloxetine Previous psychiatrist/therapist: none Hospitalizations: none Suicide attempts: denies SIB: denies Hx of violence towards others: denies Current access to guns: denies  Past Medical History:  Past Medical History:  Diagnosis Date   Arrhythmia    atrial flutter   CHF (congestive heart failure) (HCC)    Chronic kidney disease    COPD (chronic obstructive pulmonary disease) (HCC)    Coronary artery disease    Depression    Diabetes mellitus without complication (HCC)    GERD (gastroesophageal reflux disease)    Gout    Hypertension    Influenza A with respiratory manifestations    Mental disorder     Past Surgical History:  Procedure Laterality Date   ANKLE SURGERY     CARDIAC CATHETERIZATION     CARDIOVERSION N/A 07/08/2020   Procedure: CARDIOVERSION;  Surgeon: Lamar Blinks, MD;  Location: [facility] ORS;  Service: Cardiovascular;  Laterality: N/A;   COLONOSCOPY WITH PROPOFOL N/A 11/19/2021   Procedure: COLONOSCOPY WITH PROPOFOL;  Surgeon: Jenel Lucks, MD;  Location: [facility] ENDOSCOPY;  Service: Gastroenterology;  Laterality: N/A;   HERNIA REPAIR     x2   IR RADIOLOGIST EVAL & MGMT  05/13/2022   IR RADIOLOGIST EVAL & MGMT  10/27/2022   IR RADIOLOGIST  EVAL & MGMT  03/02/2023   POLYPECTOMY  11/19/2021   Procedure: POLYPECTOMY;  Surgeon: Jenel Lucks, MD;  Location: [facility];  Service: Gastroenterology;;   RADIOLOGY WITH ANESTHESIA N/A 06/23/2022   Procedure: Right renal tumor ablation;  Surgeon: Bennie Dallas, MD;  Location: [facility] OR;  Service: Radiology;  Laterality: N/A;   RIGHT HEART CATH N/A 07/13/2022   Procedure: RIGHT HEART CATH;  Surgeon: Laurey Morale, MD;  Location: [facility] INVASIVE CV LAB;  Service: Cardiovascular;  Laterality: N/A;   RIGHT/LEFT HEART CATH AND CORONARY ANGIOGRAPHY N/A 11/19/2022   Procedure: RIGHT/LEFT HEART CATH AND CORONARY ANGIOGRAPHY;  Surgeon: Laurey Morale, MD;  Location: [facility] INVASIVE CV LAB;  Service: Cardiovascular;  Laterality: N/A;   SHOULDER SURGERY     TEE WITHOUT CARDIOVERSION N/A 07/08/2020   Procedure: TRANSESOPHAGEAL ECHOCARDIOGRAM (TEE);  Surgeon: Lamar Blinks, MD;  Location: [facility] ORS;  Service: Cardiovascular;  Laterality: N/A;    Family History:  Family History  Problem Relation Age of Onset   Heart disease Father    Diabetes Mother    HIV Brother    Healthy Son    Healthy Daughter     Social History:  Social History   Socioeconomic History   Marital status: Divorced    Spouse name: Not on file   Number of children: 3   Years of education: Not on file   Highest education level: High school graduate  Occupational History   Occupation: disability  Tobacco Use   Smoking status: Every Day    Current packs/day: 1.00    Average packs/day: 1 pack/day for 43.0 years (43.0 ttl pk-yrs)    Types: Cigarettes   Smokeless tobacco: Never   Tobacco comments:        6 cigs daily--03/28/2023  Vaping Use   Vaping status: Never Used  Substance and Sexual Activity   Alcohol use: Yes    Alcohol/week: 4.0 standard drinks of alcohol    Types: 4 Shots of liquor per week   Drug use: Yes    Frequency: 21.0 times per week    Types: Marijuana    Comment: last use Cocaine- 03/28/2021.  Still using marijuana, last use 06/22/21   Sexual activity: Not on file  Other Topics Concern   Not on file  Social History Narrative   ** Merged History Encounter **       Social Determinants of Health   Financial Resource Strain: Medium Risk (03/04/2023)   Overall Financial Resource Strain (CARDIA)    Difficulty of Paying Living Expenses: Somewhat hard  Food Insecurity: No Food Insecurity (03/09/2023)   Hunger Vital Sign    Worried About Running Out of Food in the Last Year: Never true    Ran Out of Food in the Last Year: Never true  Transportation Needs: Unmet Transportation Needs (02/03/2023)   PRAPARE - Transportation    Lack of Transportation (Medical): Yes    Lack of Transportation (Non-Medical): Yes  Physical Activity: Insufficiently Active (11/16/2021)   Exercise Vital Sign    Days of Exercise per Week: 5 days    Minutes of Exercise per Session: 20  min  Stress: No Stress Concern Present (03/30/2023)   [employer] of Occupational Health - Occupational Stress Questionnaire    Feeling of Stress : Only a little  Recent Concern: Stress - Stress Concern Present (01/17/2023)   [employer] of Occupational Health - Occupational Stress Questionnaire    Feeling of Stress : To some extent  Social Connections: Moderately Isolated (07/23/2022)   Social Connection and Isolation Panel [NHANES]    Frequency of Communication with Friends and Family: Twice a week    Frequency of Social Gatherings with Friends and Family: Once a week    Attends Religious Services: More than 4 times per year    Active Member of [employer] or Organizations: No    Attends Banker Meetings: Never    Marital Status: Divorced    Allergies: No Known Allergies  Current Medications: Current Outpatient Medications  Medication Sig Dispense Refill   traZODone (DESYREL) 50 MG tablet Take 2 tablets (100 mg total) by mouth at bedtime. 180 tablet 0   Accu-Chek Softclix Lancets lancets Use as instructed  100 each 12   acetaminophen (TYLENOL) 500 MG tablet Take 1-2 tablets (500-1,000 mg total) by mouth every 6 (six) hours as needed (pain.). 30 tablet 0   albuterol (PROVENTIL) (2.5 MG/3ML) 0.083% nebulizer solution Take 3 mLs (2.5 mg total) by nebulization every 4 (four) hours as needed for wheezing or shortness of breath. 300 mL 2   albuterol (VENTOLIN HFA) 108 (90 Base) MCG/ACT inhaler Inhale 2 puffs into the lungs every 6 (six) hours as needed for wheezing or shortness of breath. 54 g 6   allopurinol (ZYLOPRIM) 100 MG tablet TAKE 2 TABLETS (200 MG TOTAL) BY MOUTH DAILY. 60 tablet 2   apixaban (ELIQUIS) 5 MG TABS tablet Take 1 tablet (5 mg total) by mouth 2 (two) times daily. 60 tablet 11   atorvastatin (LIPITOR) 80 MG tablet Take 1 tablet (80 mg total) by mouth daily. 90 tablet 1   baclofen (LIORESAL) 10 MG tablet Take 1 tablet (10 mg total) by mouth 3 (three) times daily. (Patient not taking: Reported on 04/08/2023) 30 each 0   Blood Glucose Monitoring Suppl (ACCU-CHEK AVIVA PLUS) w/Device KIT 1 each by Does not apply route 3 (three) times daily. 1 kit 0   carvedilol (COREG) 12.5 MG tablet Take 1 tablet (12.5 mg total) by mouth 2 (two) times daily. 180 tablet 3   colchicine 0.6 MG tablet SMARTSIG:0.5 Tablet(s) By Mouth 3 Times a Week     dapagliflozin propanediol (FARXIGA) 10 MG TABS tablet Take 1 tablet (10 mg total) by mouth daily. 90 tablet 3   fluticasone-salmeterol (ADVAIR HFA) 230-21 MCG/ACT inhaler Inhale 2 puffs into the lungs 2 (two) times daily. 1 each 12   hydrOXYzine (ATARAX) 25 MG tablet Take 1 tablet 3 times a day as needed for anxiety. Take 2 tablets at night as need for sleep 270 tablet 0   isosorbide-hydrALAZINE (BIDIL) 20-37.5 MG tablet Take 1 tablet by mouth 3 (three) times daily. 270 tablet 3   lidocaine (LIDODERM) 5 % Place 1 patch onto the skin daily. Remove & Discard patch within 12 hours or as directed by MD (Patient not taking: Reported on 04/08/2023) 30 patch 0    mirtazapine (REMERON) 15 MG tablet Take 1 tablet (15 mg total) by mouth at bedtime. 90 tablet 0   potassium chloride (KLOR-CON M) 10 MEQ tablet Take 2 tablets (20 mEq total) by mouth daily. 180 tablet 3   Tiotropium Bromide Monohydrate (  SPIRIVA RESPIMAT) 2.5 MCG/ACT AERS Inhale 2 puffs into the lungs daily. 4 g 6   torsemide 40 MG TABS Take 20 mg daily, alternating with 40 mg daily. 30 tablet 0   traMADol (ULTRAM) 50 MG tablet Take 1 tablet (50 mg total) by mouth every 6 (six) hours as needed for up to 5 days. 20 tablet 0   No current facility-administered medications for this visit.    ROS:   Objective:  Psychiatric Specialty Exam: There were no vitals taken for this visit.There is no height or weight on file to calculate BMI.  General Appearance: Casual  Eye Contact:  Good  Speech:  Clear and Coherent  Volume:  Normal  Mood:  Anxious and Depressed  Affect:  Appropriate and Constricted  Thought Process:  Coherent, Goal Directed, and Linear  Orientation:  Full (Time, Place, and Person)  Thought Content: Logical   Suicidal Thoughts:  No  Homicidal Thoughts:  No  Memory:  Negative  Judgment:  Intact  Insight:  Fair  Psychomotor Activity:  Normal  Concentration:  Concentration: Fair              Assets:  Communication Skills Desire for Improvement Financial Resources/Insurance Housing  ADL's:  Intact  Cognition: WNL  Sleep:  Poor   PE: General: fatigued, fairly groomed Pulm: no increased work of breathing on room air  Strength & Muscle Tone: within normal limits Neuro: no focal neurological deficits observed  Gait & Station: slight limp when walking  Metabolic Disorder Labs: Lab Results  Component Value Date   HGBA1C 6.1 (H) 01/05/2023   MPG 131.24 04/20/2022   MPG 151.33 02/09/2021   No results found for: "PROLACTIN" Lab Results  Component Value Date   CHOL 155 02/25/2023   TRIG 376 (H) 02/25/2023   HDL 27 (L) 02/25/2023   CHOLHDL 5.7 02/25/2023   VLDL  75 (H) 02/25/2023   LDLCALC 53 02/25/2023   LDLCALC 125 (H) 12/22/2022   Lab Results  Component Value Date   TSH 1.592 10/26/2019   TSH 0.842 11/30/2018    Therapeutic Level Labs: No results found for: "LITHIUM" No results found for: "VALPROATE" No results found for: "CBMZ"  Screenings: AUDIT    Flowsheet Row [facility]  Alcohol Use Disorder Identification Test Final Score (AUDIT) 1      GAD-7    Flowsheet Row Office Visit from 01/05/2023 in [facility] Counselor from 09/23/2022 in [facility] Office Visit from 08/09/2022 in [facility] Health Comm Health [place] - A Dept Of [place]. [facility] Office Visit from 05/11/2022 in [facility] [place] - A Dept Of Eligha Bridegroom. [facility] Office Visit from 12/21/2021 in [facility] Health Comm Health [place] - A Dept Of Eligha Bridegroom. [facility]  Total GAD-7 Score 0 20 15 16 8       PHQ2-9    Flowsheet Row Office Visit from 01/05/2023 in [facility] Family Medicine Counselor from 09/23/2022 in [facility] Office Visit from 08/26/2022 in [facility] Office Visit from 08/09/2022 in [facility] Comm [facility] [place] - A Dept Of [place]. [facility] Patient Outreach Telephone from 07/08/2022 in [place] POPULATION HEALTH DEPARTMENT  PHQ-2 Total Score 0 6 5 5 4   PHQ-9 Total Score -- 21 17 17 16       Flowsheet Row ED from 03/25/2023 in [facility] at [facility] ED from 12/20/2022 in [facility] Emergency Department at [facility] ED from 12/09/2022 in [facility] Emergency Department at [facility]  C-SSRS RISK CATEGORY No Risk No Risk No Risk       Collaboration of Care: Collaboration of Care:   Patient/Guardian was advised Release of Information must be obtained  prior to any record release in order to collaborate their care with an outside provider. Patient/Guardian was advised if they have not already done so to contact the registration department to sign all necessary forms in order for [place] to release information regarding their care.   Consent: Patient/Guardian gives verbal consent for treatment and assignment of benefits for services provided during this visit. Patient/Guardian expressed understanding and agreed to proceed.   A total of 30 minutes was spent involved in face to face clinical care, chart review, and documentation.   Park Pope, MD 05/17/2023, 8:48 PM

## 2023-05-18 ENCOUNTER — Emergency Department (HOSPITAL_COMMUNITY): Payer: Medicaid Other

## 2023-05-18 ENCOUNTER — Other Ambulatory Visit: Payer: Self-pay

## 2023-05-18 ENCOUNTER — Telehealth: Payer: Self-pay | Admitting: Internal Medicine

## 2023-05-18 ENCOUNTER — Emergency Department (HOSPITAL_COMMUNITY)
Admission: EM | Admit: 2023-05-18 | Discharge: 2023-05-18 | Disposition: A | Discharge status: Home / Self Care | Payer: Medicaid Other | Attending: Emergency Medicine | Admitting: Emergency Medicine

## 2023-05-18 ENCOUNTER — Encounter (HOSPITAL_COMMUNITY): Payer: Self-pay

## 2023-05-18 DIAGNOSIS — R0602 Shortness of breath: Secondary | ICD-10-CM | POA: Diagnosis not present

## 2023-05-18 DIAGNOSIS — Z7901 Long term (current) use of anticoagulants: Secondary | ICD-10-CM | POA: Insufficient documentation

## 2023-05-18 DIAGNOSIS — R0981 Nasal congestion: Secondary | ICD-10-CM | POA: Insufficient documentation

## 2023-05-18 DIAGNOSIS — R918 Other nonspecific abnormal finding of lung field: Secondary | ICD-10-CM | POA: Diagnosis not present

## 2023-05-18 DIAGNOSIS — R079 Chest pain, unspecified: Secondary | ICD-10-CM | POA: Diagnosis not present

## 2023-05-18 DIAGNOSIS — R1013 Epigastric pain: Secondary | ICD-10-CM | POA: Diagnosis not present

## 2023-05-18 DIAGNOSIS — R531 Weakness: Secondary | ICD-10-CM | POA: Diagnosis not present

## 2023-05-18 LAB — BASIC METABOLIC PANEL
Anion gap: 9 (ref 5–15)
BUN: 38 mg/dL — ABNORMAL HIGH (ref 6–20)
CO2: 28 mmol/L (ref 22–32)
Calcium: 8.9 mg/dL (ref 8.9–10.3)
Chloride: 99 mmol/L (ref 98–111)
Creatinine, Ser: 2.33 mg/dL — ABNORMAL HIGH (ref 0.61–1.24)
GFR, Estimated: 31 mL/min — ABNORMAL LOW (ref >60)
Glucose, Bld: 103 mg/dL — ABNORMAL HIGH (ref 70–99)
Potassium: 5.2 mmol/L — ABNORMAL HIGH (ref 3.5–5.1)
Sodium: 136 mmol/L (ref 135–145)

## 2023-05-18 LAB — CBC
HCT: 48.1 % (ref 39.0–52.0)
Hemoglobin: 15.7 g/dL (ref 13.0–17.0)
MCH: 30.4 pg (ref 26.0–34.0)
MCHC: 32.6 g/dL (ref 30.0–36.0)
MCV: 93 fL (ref 80.0–100.0)
Platelets: 197 10*3/uL (ref 150–400)
RBC: 5.17 MIL/uL (ref 4.22–5.81)
RDW: 15 % (ref 11.5–15.5)
WBC: 9.1 10*3/uL (ref 4.0–10.5)
nRBC: 0 % (ref 0.0–0.2)

## 2023-05-18 LAB — HEPATIC FUNCTION PANEL
ALT: 20 U/L (ref 0–44)
AST: 22 U/L (ref 15–41)
Albumin: 3.7 g/dL (ref 3.5–5.0)
Alkaline Phosphatase: 76 U/L (ref 38–126)
Bilirubin, Direct: 0.1 mg/dL (ref 0.0–0.2)
Indirect Bilirubin: 0.5 mg/dL (ref 0.3–0.9)
Total Bilirubin: 0.6 mg/dL (ref <1.2)
Total Protein: 7 g/dL (ref 6.5–8.1)

## 2023-05-18 LAB — LIPASE, BLOOD: Lipase: 24 U/L (ref 11–51)

## 2023-05-18 LAB — TROPONIN I (HIGH SENSITIVITY)
Troponin I (High Sensitivity): 41 ng/L — ABNORMAL HIGH (ref <18)
Troponin I (High Sensitivity): 43 ng/L — ABNORMAL HIGH (ref <18)

## 2023-05-18 MED ORDER — DOXYCYCLINE HYCLATE 100 MG PO CAPS: 100.0000 mg | ORAL_CAPSULE | Freq: Two times a day (BID) | ORAL | 0 refills | Status: DC

## 2023-05-18 MED ORDER — ALUM & MAG HYDROXIDE-SIMETH 200-200-20 MG/5ML PO SUSP
30.0000 mL | Freq: Once | ORAL | Status: AC
  Administered 2023-05-18: 30 mL via ORAL
  Filled 2023-05-18: qty 30

## 2023-05-18 NOTE — ED Provider Notes (Signed)
[place] EMERGENCY DEPARTMENT AT [facility] Provider Note   CSN: [id number] Arrival date & time: 05/18/23  0818     History  Chief Complaint  Patient presents with   Chest Pain   Abdominal Pain   Weakness   Nasal Congestion   Shortness of Breath    Chris [name] is a 62 y.o. male.  62 yo M with multiple complaints patient says that he has not had a good bowel movement in a while and has been having diffuse burning abdominal discomfort.  He tried milk of magnesia and an Ex-Lax without significant improvement.  He said his chest has been hurting for at least a couple weeks.  He has trouble describing it.  Mostly epigastric.  Not sure what makes it better or worse.  Has been coughing as well.  Congested.  No fevers.  Has difficulty breathing at times but has had that problem for a while and is not sure it has significantly changed.   Chest Pain Associated symptoms: abdominal pain, shortness of breath and weakness   Abdominal Pain Associated symptoms: chest pain and shortness of breath   Weakness Associated symptoms: abdominal pain, chest pain and shortness of breath   Shortness of Breath Associated symptoms: abdominal pain and chest pain        Home Medications Prior to Admission medications   Medication Sig Start Date End Date Taking? Authorizing Provider  acetaminophen (TYLENOL) 500 MG tablet Take 1-2 tablets (500-1,000 mg total) by mouth every 6 (six) hours as needed (pain.). 12/09/22  Yes Gerhard Munch, MD  albuterol (PROVENTIL) (2.5 MG/3ML) 0.083% nebulizer solution Take 3 mLs (2.5 mg total) by nebulization every 4 (four) hours as needed for wheezing or shortness of breath. 01/05/23 05/18/23 Yes McClung, Marzella Schlein, PA-C  albuterol (VENTOLIN HFA) 108 (90 Base) MCG/ACT inhaler Inhale 2 puffs into the lungs every 6 (six) hours as needed for wheezing or shortness of breath. 12/08/22  Yes [name], MD  allopurinol (ZYLOPRIM) 100 MG tablet TAKE 2 TABLETS  (200 MG TOTAL) BY MOUTH DAILY. 01/05/23  Yes McClung, Marzella Schlein, PA-C  apixaban (ELIQUIS) 5 MG TABS tablet Take 1 tablet (5 mg total) by mouth 2 (two) times daily. 12/08/22  Yes Laurey Morale, MD  atorvastatin (LIPITOR) 80 MG tablet Take 1 tablet (80 mg total) by mouth daily. 12/23/22 05/18/23 Yes Laurey Morale, MD  doxycycline (VIBRAMYCIN) 100 MG capsule Take 1 capsule (100 mg total) by mouth 2 (two) times daily. One po bid x 7 days 05/18/23  Yes Melene Plan, DO  Accu-Chek Softclix Lancets lancets Use as instructed 04/28/22   [name], MD  baclofen (LIORESAL) 10 MG tablet Take 1 tablet (10 mg total) by mouth 3 (three) times daily. Patient not taking: Reported on 04/08/2023 12/20/22   Arthor Captain, PA-C  Blood Glucose Monitoring Suppl (ACCU-CHEK AVIVA PLUS) w/Device KIT 1 each by Does not apply route 3 (three) times daily. 03/09/23   [name], MD  carvedilol (COREG) 12.5 MG tablet Take 1 tablet (12.5 mg total) by mouth 2 (two) times daily. 12/08/22   Laurey Morale, MD  colchicine 0.6 MG tablet SMARTSIG:0.5 Tablet(s) By Mouth 3 Times a Week 02/22/23   [provider]  dapagliflozin propanediol (FARXIGA) 10 MG TABS tablet Take 1 tablet (10 mg total) by mouth daily. 12/08/22   [name], MD  fluticasone-salmeterol (ADVAIR HFA) [phone] MCG/ACT inhaler Inhale 2 puffs into the lungs 2 (two) times daily. 03/28/23  Raechel Chute, MD  hydrOXYzine (ATARAX) 25 MG tablet Take 1 tablet 3 times a day as needed for anxiety. Take 2 tablets at night as need for sleep 05/17/23   Park Pope, MD  isosorbide-hydrALAZINE (BIDIL) 20-37.5 MG tablet Take 1 tablet by mouth 3 (three) times daily. 08/03/22   [name], MD  lidocaine (LIDODERM) 5 % Place 1 patch onto the skin daily. Remove & Discard patch within 12 hours or as directed by MD Patient not taking: Reported on 04/08/2023 03/26/23   Gareth Eagle, PA-C  mirtazapine (REMERON) 15 MG tablet Take 1 tablet (15 mg total) by  mouth at bedtime. 05/17/23 08/15/23  Park Pope, MD  potassium chloride (KLOR-CON M) 10 MEQ tablet Take 2 tablets (20 mEq total) by mouth daily. 12/08/22   Laurey Morale, MD  potassium chloride (KLOR-CON) 10 MEQ tablet Take 20 mEq by mouth daily. 05/17/23   [provider]  Tiotropium Bromide Monohydrate (SPIRIVA RESPIMAT) 2.5 MCG/ACT AERS Inhale 2 puffs into the lungs daily. 12/08/22   [name], MD  torsemide (DEMADEX) 20 MG tablet Take 20 mg by mouth. 05/17/23   [provider]  torsemide 40 MG TABS Take 20 mg daily, alternating with 40 mg daily. 12/09/22   Gerhard Munch, MD  traMADol (ULTRAM) 50 MG tablet Take 1 tablet (50 mg total) by mouth every 6 (six) hours as needed for up to 5 days. 05/16/23 05/21/23  Amador Cunas, PA-C  traZODone (DESYREL) 50 MG tablet Take 2 tablets (100 mg total) by mouth at bedtime. 05/17/23 08/15/23  Park Pope, MD  metoprolol tartrate (LOPRESSOR) 100 MG tablet Take 1 tablet (100 mg total) by mouth 2 (two) times daily. 07/09/20 07/11/20  Marrion Coy, MD      Allergies    Patient has no known allergies.    Review of Systems   Review of Systems  Respiratory:  Positive for shortness of breath.   Cardiovascular:  Positive for chest pain.  Gastrointestinal:  Positive for abdominal pain.  Neurological:  Positive for weakness.    Physical Exam Updated Vital Signs BP 120/88   Pulse 78   Temp (!) 97.4 F (36.3 C) (Oral)   Resp 18   Ht 6' (1.829 m)   Wt 119.7 kg   SpO2 98%   BMI 35.80 kg/m  Physical Exam Vitals and nursing note reviewed.  Constitutional:      Appearance: He is well-developed.  HENT:     Head: Normocephalic and atraumatic.  Eyes:     Pupils: Pupils are equal, round, and reactive to light.  Neck:     Vascular: No JVD.  Cardiovascular:     Rate and Rhythm: Normal rate and regular rhythm.     Heart sounds: No murmur heard.    No friction rub. No gallop.  Pulmonary:     Effort: No respiratory distress.      Breath sounds: No wheezing.  Abdominal:     General: There is no distension.     Tenderness: There is no abdominal tenderness. There is no guarding or rebound.  Musculoskeletal:        General: Normal range of motion.     Cervical back: Normal range of motion and neck supple.  Skin:    Coloration: Skin is not pale.     Findings: No rash.  Neurological:     Mental Status: He is alert and oriented to person, place, and time.  Psychiatric:  Behavior: Behavior normal.     ED Results / Procedures / Treatments   Labs (all labs ordered are listed, but only abnormal results are displayed) Labs Reviewed  BASIC METABOLIC PANEL - Abnormal; Notable for the following components:      Result Value   Potassium 5.2 (*)    Glucose, Bld 103 (*)    BUN 38 (*)    Creatinine, Ser 2.33 (*)    GFR, Estimated 31 (*)    All other components within normal limits  TROPONIN I (HIGH SENSITIVITY) - Abnormal; Notable for the following components:   Troponin I (High Sensitivity) 41 (*)    All other components within normal limits  CBC  HEPATIC FUNCTION PANEL  LIPASE, BLOOD  TROPONIN I (HIGH SENSITIVITY)    EKG EKG Interpretation Date/Time:  Wednesday May 18 2023 08:37:33 EST Ventricular Rate:  77 PR Interval:  128 QRS Duration:  116 QT Interval:  420 QTC Calculation: 475 R Axis:   13  Text Interpretation: Normal sinus rhythm Cannot rule out Anterior infarct , age undetermined ST & T wave abnormality, consider inferolateral ischemia Abnormal ECG st depression in II and aVF, similar morphology on prior 6/23 though no obvious depression on prior Confirmed by Melene Plan [phone]) on 05/18/2023 9:10:33 AM  Radiology DG Chest 2 View  Result Date: 05/18/2023 CLINICAL DATA:  Chest pain EXAM: CHEST - 2 VIEW COMPARISON:  12/20/2022 FINDINGS: Bibasilar atelectasis or infiltrate noted. No pulmonary edema. Cardiopericardial silhouette is at upper limits of normal for size. no substantial pleural  effusion. No acute bony abnormality. IMPRESSION: Bibasilar atelectasis or infiltrate. Electronically Signed   By: Kennith Center M.D.   On: 05/18/2023 08:59    Procedures Procedures    Medications Ordered in ED Medications  alum & mag hydroxide-simeth (MAALOX/MYLANTA) 200-200-20 MG/5ML suspension 30 mL (30 mLs Oral Given 05/18/23 4403)    ED Course/ Medical Decision Making/ A&P                                 Medical Decision Making Amount and/or Complexity of Data Reviewed Labs: ordered. Radiology: ordered.  Risk OTC drugs.   62 yo M with multiple complaints feels like he is constipated and needs to use the bathroom has been going on for a few days now also has been coughing congested and has some epigastric discomfort.  He feels like the epigastric discomfort got quite a bit worse last night.  Not exertional.  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  Had a cardiac catheterization done about 6 months ago that showed 65% stenosis in that stent otherwise with clean coronaries.  His EKG has possible depression that is new.  His history is not consistent with cardiac disease.  Will wait a troponin.  Will discuss with cardiology.   Chest x-ray on my independent interpretation with bilateral atelectasis versus infiltrates.  He has been coughing a bit though has no cough in the room.  He does have faint crackles at the bases may be more consistent with bilateral pneumonia.  Patient continues to have no issues with coughing while he is here.  Continues to be well on room air.  His blood work is resulted with a modest bump in his renal function.  Potassium is mildly elevated at 5.2.  His troponin is slightly above what his baseline is.  I did discuss his EKG with Dr. Gery Pray, cardiology  he thought it was more consistent with LVH with strain than acute ischemia.  Patient has become a bit anxious.  He no longer wants to wait for a second troponin.  I had a discussion  with him about waiting just a bit longer to get the results and he would prefer to go home at this time.  Will have him follow-up with his family doctor in the office.  As his primary complaints seem to be around moving his bowels we will give him information for a MiraLAX cleanout.  12:48 PM:  I have discussed the diagnosis/risks/treatment options with the patient.  Evaluation and diagnostic testing in the emergency department does not suggest an emergent condition requiring admission or immediate intervention beyond what has been performed at this time.  They will follow up with PCP. We also discussed returning to the ED immediately if new or worsening sx occur. We discussed the sx which are most concerning (e.g., sudden worsening pain, fever, inability to tolerate by mouth) that necessitate immediate return. Medications administered to the patient during their visit and any new prescriptions provided to the patient are listed below.  Medications given during this visit Medications  alum & mag hydroxide-simeth (MAALOX/MYLANTA) 200-200-20 MG/5ML suspension 30 mL (30 mLs Oral Given 05/18/23 1610)     The patient appears reasonably screen and/or stabilized for discharge and I doubt any other medical condition or other [facility] requiring further screening, evaluation, or treatment in the ED at this time prior to discharge.          Final Clinical Impression(s) / ED Diagnoses Final diagnoses:  Epigastric pain    Rx / DC Orders ED Discharge Orders          Ordered    doxycycline (VIBRAMYCIN) 100 MG capsule  2 times daily        05/18/23 1247              Melene Plan, DO 05/18/23 1248

## 2023-05-18 NOTE — ED Triage Notes (Signed)
Pt. Stated, Ive had chest tightness with a bloated stomach that hurts, feel weak, and had a lot of congestion. This all started about 2 days ago.

## 2023-05-18 NOTE — ED Notes (Signed)
Pt coming to nurses station and asked for phone to order food, phone was given to pt and he did not make a phone call, pt asked for doctor and this nurse advised him I would let him know he wanted to talk to him. Asked pt numerous times to go back to his room and leave the nurses station, pt refusing stating he did not have to go back to his room. Pt then asking people as they walk by for the doctor. This nurse again asked pt to go back to his room and pt still refusing, this nurse then advised security would be called and he stated he did not care and becoming confrontational with this nurse. Staff member who knew pt was able to redirect pt back to room. Provider informed.

## 2023-05-18 NOTE — Discharge Instructions (Addendum)
Take 8 scoops of miralax in 32oz of whatever you would like to drink.(Gatorade comes in this size) You can also use a fleets enema which you can buy over the counter at the pharmacy.  Return for worsening abdominal pain, vomiting or fever. ? ?

## 2023-05-18 NOTE — ED Notes (Signed)
Lab called to add on lipase and hfp.

## 2023-05-18 NOTE — ED Notes (Signed)
Patient transported to X-ray 

## 2023-05-19 ENCOUNTER — Other Ambulatory Visit: Payer: Self-pay | Admitting: *Deleted

## 2023-05-19 ENCOUNTER — Other Ambulatory Visit: Payer: Self-pay | Admitting: Licensed Clinical Social Worker

## 2023-05-19 NOTE — Patient Instructions (Signed)
Visit Information  Chris [name] was given information about Medicaid Managed Care team care coordination services as a part of their Healthy [facility] Medicaid benefit. Chris [name] verbally consented to engagement with the [facility] Managed Care team.   If you are experiencing a medical emergency, please call 911 or report to your local emergency department or urgent care.   If you have a non-emergency medical problem during routine business hours, please contact your provider's office and ask to speak with a nurse.   For questions related to your Healthy [facility] health plan, please call: [phone] or visit the homepage here: [email or web address]  If you would like to schedule transportation through your Healthy [facility] plan, please call the following number at least 2 days in advance of your appointment: [phone]  For information about your ride after you set it up, call Ride Assist at [phone]. Use this number to activate a Will Call pickup, or if your transportation is late for a scheduled pickup. Use this number, too, if you need to make a change or cancel a previously scheduled reservation.  If you need transportation services right away, call [phone]. The after-hours call center is staffed 24 hours to handle ride assistance and urgent reservation requests (including discharges) 365 days a year. Urgent trips include sick visits, hospital discharge requests and life-sustaining treatment.  Call the [facility] Line at [phone], at any time, 24 hours a day, 7 days a week. If you are in danger or need immediate medical attention call 911.  If you would like help to quit smoking, call 1-800-QUIT-NOW ((301)757-9647) OR Espaol: 1-855-Djelo-Ya (4-742-595-6387) o para ms informacin haga clic aqu or Text READY to 564-332 to register via text  Chris [name],   Please see education materials related to smoking cessation  provided as print materials.   The patient verbalized understanding of instructions, educational materials, and care plan provided today and agreed to receive a mailed copy of patient instructions, educational materials, and care plan.   Telephone follow up appointment with Managed Medicaid care management team member scheduled for:06/21/23 at 1:15pm  Estanislado Emms RN, BSN [place]  Value-Based [facility] Health RN Care Coordinator [phone]   Following is a copy of your plan of care:  Care Plan : RN Care Manager Plan of Care  Updates made by [name], RN since 05/19/2023 12:00 AM     Problem: Health Management Needs Related to Heart Failure and Chronic Pain   Priority: High  Note:   Current Barriers:    RNCM Clinical Goal(s):  Patient will take all medications exactly as prescribed and will call provider for medication related questions as evidenced by medication review and patient interview demonstrate Ongoing adherence to prescribed treatment plan for CHF as evidenced by medication review continue to work with RN Care Manager to address care management and care coordination needs related to  CHF as evidenced by adherence to CM Team Scheduled appointments work with social worker to address  related to the management of Limited social support, Transportation, and Lack of essential utilities - light/power* related to the management of CHF as evidenced by review of EMR and patient or Child psychotherapist report through collaboration with Medical illustrator, provider, and care team.   Interventions: Collaboration with LCSW/[name]/Care Guide and Heart Failure RN Inter-disciplinary care team collaboration (see longitudinal plan of care) Evaluation of current treatment plan related to  self management and patient's adherence to plan as established by provider   Heart  Failure Interventions:  (Status:  New goal.) Long Term Goal Discussed importance of daily weight and  advised patient to weigh and record daily Reviewed role of diuretics in prevention of fluid overload and management of heart failure; Discussed the importance of keeping all appointments with provider Assessed social determinant of health barriers  Discussed importance of self assessment reporting of swelling, increased shortness of breath, chest pain, dizziness, or other new or worsened symptom  Pain Interventions:  (Status:  New goal.) Short Term Goal Pain assessment performed Medications reviewed Reviewed provider established plan for pain management Discussed importance of adherence to all scheduled medical appointments Counseled on the importance of reporting any/all new or changed pain symptoms or management strategies to pain management provider Reviewed with patient prescribed pharmacological and nonpharmacological pain relief strategies Assessed social determinant of health barriers Advised patient that the CM team would discuss pain management needs with provider and assist with connection to pain management provider  Patient Goals/Self-Care Activities: Take all medications as prescribed Attend all scheduled provider appointments Call provider office for new concerns or questions  Work with the social worker to address care coordination needs and will continue to work with the clinical team to address health care and disease management related needs call 1-800-273-TALK (toll free, 24 hour hotline) if experiencing a Mental Health or Behavioral Health Crisis   Follow Up Plan:  Telephone follow up appointment with care management team member scheduled for:  07/27/22 10am      Long-Range Goal: Independent self health management of Heart Failure and Chronic Pain   Start Date: 07/23/2022  Expected End Date: 06/14/2023  Recent Progress: On track  Priority: High  Note:   Current Barriers:  Knowledge Deficits related to plan of care for management of CHF and Chronic Pain Care  Coordination needs related to Transportation  Non-adherence to prescribed medication regimen Difficulty obtaining medications  RNCM Clinical Goal(s):  Patient will take all medications exactly as prescribed and will call provider for medication related questions as evidenced by medication review and patient interview demonstrate Ongoing adherence to prescribed treatment plan for CHF as evidenced by medication review continue to work with RN Care Manager to address care management and care coordination needs related to  CHF as evidenced by adherence to CM Team Scheduled appointments work with Child psychotherapist to address  related to the management of Limited social support, Transportation, and Lack of essential utilities - light/power* related to the management of CHF as evidenced by review of EMR and patient or Child psychotherapist report through collaboration with Medical illustrator, provider, and care team.   Interventions: Inter-disciplinary care team collaboration (see longitudinal plan of care) Evaluation of current treatment plan related to  self management and patient's adherence to plan as established by provider Reviewed upcoming appointments including:  05/26/23 with Cardiology and 05/30/23 with Dr. Teodoro Spray patient to take all medications with him to upcoming provider appointments Ensured patient will arrange transportation to upcoming appointments Advised patient to reschedule visit with PCP Referral to [name] for assistance with utilities-scheduled on 05/24/23 Rescheduled missed appointment with LCSW    COPD Interventions:  (Status:  Goal on track:  Yes.) Long Term Goal Advised patient to track and manage COPD triggers Advised patient to engage in light exercise as tolerated 3-5 days a week to aid in the the management of COPD Provided education about and advised patient to utilize infection prevention strategies to reduce risk of respiratory infection Discussed the importance of  adequate rest and management  of fatigue with COPD Assessed social determinant of health barriers Advised patient on smoking cessation, provided education Congratulated patient on cutting back on smoking, plan to work on smoking cessation goal during next visit-down to 1.5 pk/week Discussed setting a quit date, encouraged patient to work on cutting back   Pain Interventions:  (Status:  Goal on track) Long Term Goal-Left hip pain 10/10, seen in the ED on 10/11-10/12 Pain assessment performed Medications reviewed: advised patient to request Lidoderm patch from [facility] Reviewed provider established plan for pain management Discussed importance of adherence to all scheduled medical appointments Counseled on the importance of reporting any/all new or changed pain symptoms or management strategies to pain management provider Reviewed with patient prescribed pharmacological and nonpharmacological pain relief strategies Assessed social determinant of health barriers Patient has a new patient appointment on 05/30/23, discussed the importance of attending appointment  Patient Goals/Self-Care Activities: Take all medications as prescribed Attend all scheduled provider appointments Call provider office for new concerns or questions  Work with the social worker to address care coordination needs and will continue to work with the clinical team to address health care and disease management related needs call 1-800-273-TALK (toll free, 24 hour hotline) if experiencing a Mental Health or Behavioral Health Crisis   Follow Up Plan:  Telephone follow up appointment with care management team member scheduled for:  06/21/23 at 1:15pm

## 2023-05-19 NOTE — Patient Outreach (Addendum)
  Medicaid Managed Care   Unsuccessful Attempt Note   05/19/2023 Name: Chris [name] MRN: [id number] DOB: [birth year]  Referred by: [name], MD Reason for referral : No chief complaint on file.   An unsuccessful telephone outreach was attempted today. The patient was referred to the case management team for assistance with care management and care coordination.    Follow Up Plan: The Managed Medicaid care management team will reach out to the patient again over the next 30 days.    Dickie La, [name], MSW, LCSW Licensed Clinical Social Worker [employer]   [email or web address] [email or web address] Direct Dial: [phone]

## 2023-05-19 NOTE — Patient Outreach (Signed)
Care Coordination  05/19/2023  Chris [name] September 19, 1960 [id number]  Patient missed this morning's social work appointment but this was successfully rescheduled by [facility] RNCM for 05/25/23 at 11 am during today's session with patient.   Dickie La, [name], MSW, LCSW Licensed Clinical Social Worker [employer]   [email or web address] Direct Dial: [phone]

## 2023-05-19 NOTE — Patient Outreach (Signed)
Medicaid Managed Care   Nurse Care Manager Note  05/19/2023 Name:  Chris [name] MRN:  [id number] DOB:  02-02-1961  Chris [name] is an 62 y.o. year old male who is a primary patient of Chris Matar, MD.  The [facility] Managed Care Coordination team was consulted for assistance with:    COPD Pain  [name] was given information about Medicaid Managed Care Coordination team services today. Chris [name] Patient agreed to services and verbal consent obtained.  Engaged with patient by telephone for follow up visit in response to provider referral for case management and/or care coordination services.   Assessments/Interventions:  Review of past medical history, allergies, medications, health status, including review of consultants reports, laboratory and other test data, was performed as part of comprehensive evaluation and provision of chronic care management services.  SDOH (Social Determinants of Health) assessments and interventions performed: SDOH Interventions    Flowsheet Row Patient Outreach Telephone from 05/19/2023 in [place] POPULATION HEALTH DEPARTMENT Patient Outreach Telephone from 03/30/2023 in [place] POPULATION HEALTH DEPARTMENT Patient Outreach Telephone from 03/09/2023 in [place] POPULATION HEALTH DEPARTMENT Patient Outreach Telephone from 03/04/2023 in [place] POPULATION HEALTH DEPARTMENT Patient Outreach Telephone from 01/17/2023 in [place] POPULATION HEALTH DEPARTMENT Telephone from 12/23/2022 in  POPULATION HEALTH DEPARTMENT  SDOH Interventions        Food Insecurity Interventions -- -- Intervention Not Indicated -- -- --  Housing Interventions -- -- Intervention Not Indicated -- -- --  Engineer, petroleum Benefit -- -- -- -- Payor Benefit  Utilities Interventions Other (Comment)  [[name] referral for utilities] -- -- -- -- --  Financial Strain Interventions -- -- -- Other (Comment)  So [facility] [name] involved and has a F/U appt]  -- --  Stress Interventions -- [employer], Provide Counseling  [Pt reports his BH meds continues to help with anxiety and sleep but he is struggling with pain in his hip at this time which led to a recent ED visit] -- [employer], Provide Counseling Offered [employer], Provide Counseling --       Care Plan  No Known Allergies  Medications Reviewed Today     Reviewed by [name], RN (Registered Nurse) on 05/19/23 at 1402  Med List Status: <None>   Medication Order Taking? Sig Documenting Provider Last Dose Status Informant  Accu-Chek Softclix Lancets lancets [id number]  Use as instructed Chris Matar, MD  Active Self  acetaminophen (TYLENOL) 500 MG tablet [id number]  Take 1-2 tablets (500-1,000 mg total) by mouth every 6 (six) hours as needed (pain.). Gerhard Munch, MD  Active   albuterol (PROVENTIL) (2.5 MG/3ML) 0.083% nebulizer solution [id number] Yes Take 3 mLs (2.5 mg total) by nebulization every 4 (four) hours as needed for wheezing or shortness of breath. Georgian Co M, PA-C Taking Active   albuterol (VENTOLIN HFA) 108 (90 Base) MCG/ACT inhaler [id number] Yes Inhale 2 puffs into the lungs every 6 (six) hours as needed for wheezing or shortness of breath. Chris Matar, MD Taking Active   allopurinol (ZYLOPRIM) 100 MG tablet [id number] Yes TAKE 2 TABLETS (200 MG TOTAL) BY MOUTH DAILY. Anders Simmonds, [place] Taking Active            Med Note (Madilynne Mullan A   Fri Apr 08, 2023 11:32 AM)    apixaban (ELIQUIS) 5 MG TABS tablet [id number] Yes Take 1 tablet (5 mg total) by mouth 2 (two) times daily. Marca Ancona  S, MD Taking Active   atorvastatin (LIPITOR) 80 MG tablet [id number] Yes Take 1 tablet (80 mg total) by mouth daily. Laurey Morale, MD Taking Active   baclofen (LIORESAL) 10 MG tablet [id number]  Take 1 tablet (10 mg total) by mouth 3 (three) times daily.  Patient not taking: Reported on  04/08/2023   Arthor Captain, PA-C  Active   Blood Glucose Monitoring Suppl (ACCU-CHEK AVIVA PLUS) w/Device KIT [id number]  1 each by Does not apply route 3 (three) times daily. [name], MD  Active   carvedilol (COREG) 12.5 MG tablet [id number] Yes Take 1 tablet (12.5 mg total) by mouth 2 (two) times daily. Laurey Morale, MD Taking Active   colchicine 0.6 MG tablet [id number] Yes SMARTSIG:0.5 Tablet(s) By Mouth 3 Times a Week [provider] Taking Active   dapagliflozin propanediol (FARXIGA) 10 MG TABS tablet [id number] Yes Take 1 tablet (10 mg total) by mouth daily. Chris Matar, MD Taking Active   doxycycline (VIBRAMYCIN) 100 MG capsule [id number] Yes Take 1 capsule (100 mg total) by mouth 2 (two) times daily. One po bid x 7 days Melene Plan, DO Taking Active   fluticasone-salmeterol (ADVAIR HFA) 230-21 MCG/ACT inhaler [id number] Yes Inhale 2 puffs into the lungs 2 (two) times daily. Raechel Chute, MD Taking Active   hydrOXYzine (ATARAX) 25 MG tablet [id number] Yes Take 1 tablet 3 times a day as needed for anxiety. Take 2 tablets at night as need for sleep Park Pope, MD Taking Active   isosorbide-hydrALAZINE (BIDIL) 20-37.5 MG tablet [id number] Yes Take 1 tablet by mouth 3 (three) times daily. [name], MD Taking Active Self  lidocaine (LIDODERM) 5 % [id number] No Place 1 patch onto the skin daily. Remove & Discard patch within 12 hours or as directed by MD  Patient not taking: Reported on 04/08/2023   Gareth Eagle, PA-C Not Taking Active     Discontinued 07/11/20 [phone] (Discontinued by provider)   mirtazapine (REMERON) 15 MG tablet [id number] Yes Take 1 tablet (15 mg total) by mouth at bedtime. Park Pope, MD Taking Active   potassium chloride (KLOR-CON M) 10 MEQ tablet [id number] Yes Take 2 tablets (20 mEq total) by mouth daily. Laurey Morale, MD Taking Active   potassium chloride (KLOR-CON) 10 MEQ tablet [id number] No Take 20 mEq by mouth daily.  Patient not  taking: Reported on 05/19/2023   [provider] Not Taking Active   Tiotropium Bromide Monohydrate (SPIRIVA RESPIMAT) 2.5 MCG/ACT AERS [id number] Yes Inhale 2 puffs into the lungs daily. Chris Matar, MD Taking Active   torsemide (DEMADEX) 20 MG tablet [id number] Yes Take 20 mg by mouth. [provider] Taking Active   torsemide 40 MG TABS [id number] Yes Take 20 mg daily, alternating with 40 mg daily. Gerhard Munch, MD Taking Active   traMADol Janean Sark) 50 MG tablet [id number] Yes Take 1 tablet (50 mg total) by mouth every 6 (six) hours as needed for up to 5 days. Amador Cunas, PA-C Taking Active   traZODone (DESYREL) 50 MG tablet [id number] Yes Take 2 tablets (100 mg total) by mouth at bedtime. Park Pope, MD Taking Active             Patient Active Problem List   Diagnosis Date Noted   Post traumatic stress disorder 09/23/2022   MDD (major depressive disorder), recurrent episode, moderate (HCC) 08/26/2022   Stage 3b chronic kidney disease (HCC) 08/09/2022   Hypertrophic cardiomyopathy (HCC) 08/09/2022   History of renal cell  cancer 08/09/2022   Chronic heart failure with preserved ejection fraction (HFpEF) (HCC) 07/13/2022   Agitation 04/23/2022   RSV (respiratory syncytial virus pneumonia) 04/22/2022   Obesity (BMI 30-39.9) 04/21/2022   COPD exacerbation (HCC) 04/19/2022   Dyslipidemia 04/19/2022   Gout 04/19/2022   Right kidney mass 05/14/2021   CHF exacerbation (HCC) 04/14/2021   Chest pain 04/14/2021   Syncope 04/14/2021   Left-sided weakness 10/28/2020   Typical atrial flutter (HCC)    CHF (congestive heart failure) (HCC) 07/04/2020   Acute exacerbation of CHF (congestive heart failure) (HCC) 06/16/2020   Influenza vaccine refused 05/06/2020   Acute decompensated heart failure (HCC) 05/04/2020   Illiteracy 05/04/2020   Type 2 diabetes mellitus with stage 3 chronic kidney disease (HCC) 12/25/2019   Elevated troponin I level 10/26/2019    History of gout 02/01/2019   Seasonal allergic rhinitis due to pollen 02/01/2019   Nicotine use disorder 11/30/2018   Microscopic hematuria 11/30/2018   Depression 11/30/2018   Difficulty controlling anger 11/30/2018   COPD (chronic obstructive pulmonary disease) (HCC)    CKD (chronic kidney disease) stage 3, GFR 30-59 ml/min (HCC) 08/10/2018   Recurrent epistaxis 04/21/2018   Mixed hyperlipidemia 07/28/2017   Essential hypertension 07/28/2017   Chronic systolic heart failure (HCC) 10/25/2014   Cocaine abuse (HCC) 02/20/2013   Cannabis abuse 02/20/2013   Back pain, chronic 02/20/2013    Conditions to be addressed/monitored per PCP order:  CHF and Pain  Care Plan : RN Care Manager Plan of Care  Updates made by [name], RN since 05/19/2023 12:00 AM     Problem: Health Management Needs Related to Heart Failure and Chronic Pain   Priority: High  Note:   Current Barriers:    RNCM Clinical Goal(s):  Patient will take all medications exactly as prescribed and will call provider for medication related questions as evidenced by medication review and patient interview demonstrate Ongoing adherence to prescribed treatment plan for CHF as evidenced by medication review continue to work with RN Care Manager to address care management and care coordination needs related to  CHF as evidenced by adherence to CM Team Scheduled appointments work with Child psychotherapist to address  related to the management of Limited social support, Transportation, and Lack of essential utilities - light/power* related to the management of CHF as evidenced by review of EMR and patient or Child psychotherapist report through collaboration with Medical illustrator, provider, and care team.   Interventions: Collaboration with LCSW/[name]/Care Guide and Heart Failure RN Inter-disciplinary care team collaboration (see longitudinal plan of care) Evaluation of current treatment plan related to  self management and patient's  adherence to plan as established by provider   Heart Failure Interventions:  (Status:  New goal.) Long Term Goal Discussed importance of daily weight and advised patient to weigh and record daily Reviewed role of diuretics in prevention of fluid overload and management of heart failure; Discussed the importance of keeping all appointments with provider Assessed social determinant of health barriers  Discussed importance of self assessment reporting of swelling, increased shortness of breath, chest pain, dizziness, or other new or worsened symptom  Pain Interventions:  (Status:  New goal.) Short Term Goal Pain assessment performed Medications reviewed Reviewed provider established plan for pain management Discussed importance of adherence to all scheduled medical appointments Counseled on the importance of reporting any/all new or changed pain symptoms or management strategies to pain management provider Reviewed with patient prescribed pharmacological and nonpharmacological pain relief strategies Assessed  social determinant of health barriers Advised patient that the CM team would discuss pain management needs with provider and assist with connection to pain management provider  Patient Goals/Self-Care Activities: Take all medications as prescribed Attend all scheduled provider appointments Call provider office for new concerns or questions  Work with the social worker to address care coordination needs and will continue to work with the clinical team to address health care and disease management related needs call 1-800-273-TALK (toll free, 24 hour hotline) if experiencing a Mental Health or Behavioral Health Crisis   Follow Up Plan:  Telephone follow up appointment with care management team member scheduled for:  07/27/22 10am      Long-Range Goal: Independent self health management of Heart Failure and Chronic Pain   Start Date: 07/23/2022  Expected End Date: 06/14/2023  Recent  Progress: On track  Priority: High  Note:   Current Barriers:  Knowledge Deficits related to plan of care for management of CHF and Chronic Pain Care Coordination needs related to Transportation  Non-adherence to prescribed medication regimen Difficulty obtaining medications  RNCM Clinical Goal(s):  Patient will take all medications exactly as prescribed and will call provider for medication related questions as evidenced by medication review and patient interview demonstrate Ongoing adherence to prescribed treatment plan for CHF as evidenced by medication review continue to work with RN Care Manager to address care management and care coordination needs related to  CHF as evidenced by adherence to CM Team Scheduled appointments work with Child psychotherapist to address  related to the management of Limited social support, Transportation, and Lack of essential utilities - light/power* related to the management of CHF as evidenced by review of EMR and patient or Child psychotherapist report through collaboration with Medical illustrator, provider, and care team.   Interventions: Inter-disciplinary care team collaboration (see longitudinal plan of care) Evaluation of current treatment plan related to  self management and patient's adherence to plan as established by provider Reviewed upcoming appointments including:  05/26/23 with Cardiology and 05/30/23 with Dr. Teodoro Spray patient to take all medications with him to upcoming provider appointments Ensured patient will arrange transportation to upcoming appointments Advised patient to reschedule visit with PCP Referral to [name] for assistance with utilities-scheduled on 05/24/23 Rescheduled missed appointment with LCSW    COPD Interventions:  (Status:  Goal on track:  Yes.) Long Term Goal Advised patient to track and manage COPD triggers Advised patient to engage in light exercise as tolerated 3-5 days a week to aid in the the management of COPD Provided  education about and advised patient to utilize infection prevention strategies to reduce risk of respiratory infection Discussed the importance of adequate rest and management of fatigue with COPD Assessed social determinant of health barriers Advised patient on smoking cessation, provided education Congratulated patient on cutting back on smoking, plan to work on smoking cessation goal during next visit-down to 1.5 pk/week Discussed setting a quit date, encouraged patient to work on cutting back   Pain Interventions:  (Status:  Goal on track) Long Term Goal-Left hip pain 10/10, seen in the ED on 10/11-10/12 Pain assessment performed Medications reviewed: advised patient to request Lidoderm patch from [facility] Reviewed provider established plan for pain management Discussed importance of adherence to all scheduled medical appointments Counseled on the importance of reporting any/all new or changed pain symptoms or management strategies to pain management provider Reviewed with patient prescribed pharmacological and nonpharmacological pain relief strategies Assessed social determinant of health barriers Patient  has a new patient appointment on 05/30/23, discussed the importance of attending appointment  Patient Goals/Self-Care Activities: Take all medications as prescribed Attend all scheduled provider appointments Call provider office for new concerns or questions  Work with the social worker to address care coordination needs and will continue to work with the clinical team to address health care and disease management related needs call 1-800-273-TALK (toll free, 24 hour hotline) if experiencing a Mental Health or Behavioral Health Crisis   Follow Up Plan:  Telephone follow up appointment with care management team member scheduled for:  06/21/23 at 1:15pm      Follow Up:  Patient agrees to Care Plan and Follow-up.  Plan: The Managed Medicaid care management team will reach out  to the patient again over the next 30 days.  Date/time of next scheduled RN care management/care coordination outreach:  06/21/23 at 1:15pm  Estanislado Emms RN, BSN [place]  [facility] RN Care Coordinator [phone]

## 2023-05-24 ENCOUNTER — Other Ambulatory Visit: Payer: Self-pay

## 2023-05-24 NOTE — Patient Outreach (Signed)
  Medicaid Managed Care   Unsuccessful Outreach Note  05/24/2023 Name: Chris [name] MRN: [id number] DOB: Apr 27, 1961  Referred by: [name], MD Reason for referral : High Risk Managed Medicaid (MM social work unsuccessful telephone outreach )   An unsuccessful telephone outreach was attempted today. The patient was referred to the case management team for assistance with care management and care coordination.   Follow Up Plan: The patient has been provided with contact information for the care management team and has been advised to call with any health related questions or concerns.   Abelino Derrick, MHA [facility]  Managed [facility] Social Worker [phone]

## 2023-05-24 NOTE — Patient Instructions (Signed)
  Medicaid Managed Care   Unsuccessful Outreach Note  05/24/2023 Name: DEMETRIA MOLLOY MRN: [id number] DOB: Apr 27, 1961  Referred by: [name], MD Reason for referral : High Risk Managed Medicaid (MM social work unsuccessful telephone outreach )   An unsuccessful telephone outreach was attempted today. The patient was referred to the case management team for assistance with care management and care coordination.   Follow Up Plan: The patient has been provided with contact information for the care management team and has been advised to call with any health related questions or concerns.   Abelino Derrick, MHA [facility]  Managed [facility] Social Worker [phone]

## 2023-05-25 ENCOUNTER — Other Ambulatory Visit: Payer: Self-pay | Admitting: Licensed Clinical Social Worker

## 2023-05-25 NOTE — Patient Instructions (Signed)
Visit Information  Mr. Many was given information about Medicaid Managed Care team care coordination services and verbally consented to engagement with the [facility] Managed Care team.   [facility] LCSW successfully rescheduled this appointment, per patient request, for  06/13/23 at 11 am.      24- Hour Availability:    [facility]  [address] [place], [place] [phone] Crisis [phone]   [facility] [phone]  [place] [facility]  [phone]    [facility]  [phone] (after hours)   Therapeutic Alternative/Mobile Crisis   [phone]   [place] National Suicide Hotline  (732)602-7905 [place]) [place] [address]   Call (858)014-6448 for mental health emergencies   [facility]  [phone]);  [place] and [employer]  [phone]); [place], [place], [place], [place], [place], [place], [place]    [place] [facility] for [facility] Residents For 24/7 walk-up access to mental health services for [facility] children (4+), adolescents and adults, please visit the [facility] located at [address] in [place], [place].  *[place] also provides comprehensive outpatient behavioral health services in a variety of locations around the [place].  Connect With [place] [name] [place], [place] [address] HelpLine: 989-846-3413 or 1-614-878-3085  Get Directions  Find Help 24/7 By Phone Call our 24-hour HelpLine at 681-540-8302 or 613-716-0075 for immediate assistance for mental health and substance abuse issues.  Walk-In Help [place]: [facility] (Ages 4 and Up) [place]: Emergency Dept., [facility] Additional Resources National Hopeline Network: 1-800-SUICIDE The National Suicide Prevention Lifeline: 8-182-993-ZJIR     Dickie La, [name], MSW,  LCSW Licensed Clinical Social Worker [employer]   [facility] [email or web address] Direct Dial: [phone]

## 2023-05-25 NOTE — Patient Outreach (Addendum)
Care Coordination  05/25/2023  Chris [name] [birth year] [id number]  [facility] LCSW completed successful outreach to patient on 05/25/23 for follow up. However, patient is asking if he can reschedule this appointment as he is unable to have privacy during phone call at time. [facility] LCSW successfully rescheduled this appointment, per patient request for  06/13/23 at 11 am.   Dickie La, [name], MSW, LCSW Licensed Clinical Social Worker [place]   [facility] [name] Direct Dial: [phone]

## 2023-05-26 ENCOUNTER — Ambulatory Visit: Payer: Medicaid Other | Admitting: Cardiovascular Disease

## 2023-05-30 ENCOUNTER — Ambulatory Visit (INDEPENDENT_AMBULATORY_CARE_PROVIDER_SITE_OTHER): Payer: Medicaid Other | Admitting: Physical Medicine and Rehabilitation

## 2023-05-30 ENCOUNTER — Encounter: Payer: Self-pay | Admitting: Physical Medicine and Rehabilitation

## 2023-05-30 ENCOUNTER — Ambulatory Visit: Payer: Medicaid Other | Admitting: Physical Medicine and Rehabilitation

## 2023-05-30 DIAGNOSIS — M5116 Intervertebral disc disorders with radiculopathy, lumbar region: Secondary | ICD-10-CM

## 2023-05-30 DIAGNOSIS — G8929 Other chronic pain: Secondary | ICD-10-CM | POA: Diagnosis not present

## 2023-05-30 DIAGNOSIS — M5416 Radiculopathy, lumbar region: Secondary | ICD-10-CM

## 2023-05-30 DIAGNOSIS — M5441 Lumbago with sciatica, right side: Secondary | ICD-10-CM | POA: Diagnosis not present

## 2023-05-30 DIAGNOSIS — M5442 Lumbago with sciatica, left side: Secondary | ICD-10-CM

## 2023-05-30 MED ORDER — OXYCODONE-ACETAMINOPHEN 5-325 MG PO TABS: 1.0000 | ORAL_TABLET | ORAL | 0 refills | Status: DC | PRN

## 2023-05-30 NOTE — Progress Notes (Signed)
Chris [name] - 62 y.o. male MRN [id number]  Date of birth: June 11, 1961  Office Visit Note: Visit Date: 05/30/2023 PCP: [name], MD Referred by: Amador Cunas, PA*  Subjective: Chief Complaint  Patient presents with   Left Hip - Pain   HPI: Chris [name] is a 62 y.o. male who comes in today per the request of Hazle Nordmann, [place] for evaluation of chronic, worsening and severe bilateral lower back pain radiating to buttocks, hips and down posterolateral legs, left greater than right. Pain ongoing for several months, worsens with movement and activity. He describes pain as sore, aching and tingling sensation, currently rates as 10 out of 10. He has tried home exercise regimen, rest and use of medications with minimal relief of pain. Minimum relief of pain with Tramadol. States he is using alcohol to cope with his pain. Recent lumbar MRI imaging exhibits large left disc bulge at L4-L5 with narrowing of the left lateral recess and mass effect on left L5 nerve root. There is severe left foraminal stenosis at this level. No high grade spinal canal stenosis noted. Patient denies focal weakness. No recent trauma or falls.   Patients course is complicated by polysubstance abuse, alcohol abuse, tobacco abuse, depression, chronic kidney disease, COPD, heart failure, atrial flutter and diabetes mellitus.      Review of Systems  Musculoskeletal:  Positive for back pain.  Neurological:  Negative for tingling, sensory change, focal weakness and weakness.  All other systems reviewed and are negative.  Otherwise per HPI.  Assessment & Plan: Visit Diagnoses:    ICD-10-CM   1. Chronic bilateral low back pain with bilateral sciatica  M54.42 Ambulatory referral to Physical Medicine Rehab   M54.41    G89.29     2. Lumbar radiculopathy  M54.16 Ambulatory referral to Physical Medicine Rehab    3. Intervertebral disc disorders with radiculopathy, lumbar region  M51.16 Ambulatory  referral to Physical Medicine Rehab       Plan: Findings:  Chronic, worsening and severe bilateral lower back pain radiating to buttocks, hips and down posterolateral legs, left greater than right. Patient continues to have severe pain despite good conservative therapies such as home exercise regimen, rest and use of medications. I discussed recent lumbar MRI with patient today using imaging and spine model. Patients clinical presentation and exam are most consistent with L5 nerve pattern. There is large left disc bulge at L4-L5 with contacting left L5 nerve root. His exam today is difficult due to pain, no focal weakness noted. We discussed treatment plan in detail today, next step is to perform diagnostic and hopefully therapeutic left L5 transforaminal epidural steroid injection under fluoroscopic guidance. If good relief of pain with this injection we can repeat this procedure infrequently as needed. Would also consider interlaminar approach, would need permission for him to discontinue Eliquis for this type of injection. Dr. Alvester Morin at bedside to discuss injection procedure, he has no questions at this time. We also discussed medication management today, I prescribed short course of Percocet for him to take until we are able to perform injection. Patient has no questions at this time. No red flag symptoms noted upon exam today.     Meds & Orders:  Meds ordered this encounter  Medications   oxyCODONE-acetaminophen (PERCOCET/ROXICET) 5-325 MG tablet    Sig: Take 1 tablet by mouth every 4 (four) hours as needed for severe pain (pain score 7-10).    Dispense:  20 tablet  Refill:  0    Orders Placed This Encounter  Procedures   Ambulatory referral to Physical Medicine Rehab    Follow-up: Return for Left L5 transforaminal epidural steroid injection.   Procedures: No procedures performed      Clinical History: CLINICAL DATA:  Low back pain radiating to the left hip and ankle   EXAM: MRI  LUMBAR SPINE WITHOUT CONTRAST   TECHNIQUE: Multiplanar, multisequence MR imaging of the lumbar spine was performed. No intravenous contrast was administered.   COMPARISON:  None Available.   FINDINGS: Segmentation:  Standard.   Alignment:  Physiologic.   Vertebrae:  No fracture, evidence of discitis, or bone lesion.   Conus medullaris and cauda equina: Conus extends to the L1 level. Conus and cauda equina appear normal.   Paraspinal and other soft tissues: Negative   Disc levels:   L1-L2: Normal disc space and facet joints. No spinal canal stenosis. No neural foraminal stenosis.   L2-L3: Normal disc space and facet joints. No spinal canal stenosis. No neural foraminal stenosis.   L3-L4: Intermediate sized disc bulge with mild facet hypertrophy. No spinal canal stenosis. Mild left neural foraminal stenosis.   L4-L5: Large left asymmetric disc bulge with narrowing of the left lateral recess and mass effect on the left L5 nerve root. Mild facet hypertrophy. No central spinal canal stenosis. Severe left neural foraminal stenosis.   L5-S1: Mild facet hypertrophy. No spinal canal stenosis. Mild left neural foraminal stenosis.   Visualized sacrum: Normal.   IMPRESSION: 1. Large left asymmetric disc bulge at L4-L5 with narrowing of the left lateral recess and mass effect on the left L5 nerve root. Severe left neural foraminal stenosis. 2. Mild left L3-L4 and L5-S1 neural foraminal stenosis.     Electronically Signed   By: Deatra Robinson M.D.   On: 05/21/2023 02:40   He reports that he has been smoking cigarettes. He has a 43 pack-year smoking history. He has never used smokeless tobacco.  Recent Labs    08/09/22 0957 01/05/23 0903  HGBA1C 6.2 6.1*    Objective:  VS:  HT:    WT:   BMI:     BP:   HR: bpm  TEMP: ( )  RESP:  Physical Exam Vitals and nursing note reviewed.  HENT:     Head: Normocephalic and atraumatic.     Right Ear: External ear normal.      Left Ear: External ear normal.     Nose: Nose normal.     Mouth/Throat:     Mouth: Mucous membranes are moist.  Eyes:     Extraocular Movements: Extraocular movements intact.  Cardiovascular:     Rate and Rhythm: Normal rate.     Pulses: Normal pulses.  Pulmonary:     Effort: Pulmonary effort is normal.  Abdominal:     General: Abdomen is flat. There is no distension.  Musculoskeletal:        General: Tenderness present.     Cervical back: Normal range of motion.     Comments: Patient is slow to rise from seated position to standing. Exam today difficult due to decreased effort and pain. Good lumbar range of motion. No pain noted with facet loading. 5/5 strength noted with bilateral hip flexion, knee flexion/extension, ankle dorsiflexion/plantarflexion and EHL. No clonus noted bilaterally. No pain upon palpation of greater trochanters. No pain with internal/external rotation of bilateral hips. Sensation intact bilaterally. Dysesthesias noted to left L5 dermatome. Negative slump test bilaterally. Ambulates without aid, gait  steady.     Skin:    General: Skin is warm and dry.     Capillary Refill: Capillary refill takes less than 2 seconds.  Neurological:     General: No focal deficit present.     Mental Status: He is alert and oriented to person, place, and time.  Psychiatric:        Mood and Affect: Mood normal.        Behavior: Behavior normal.     Ortho Exam  Imaging: No results found.  Past Medical/Family/Surgical/Social History: Medications & Allergies reviewed per EMR, new medications updated. Patient Active Problem List   Diagnosis Date Noted   Post traumatic stress disorder 09/23/2022   MDD (major depressive disorder), recurrent episode, moderate (HCC) 08/26/2022   Stage 3b chronic kidney disease (HCC) 08/09/2022   Hypertrophic cardiomyopathy (HCC) 08/09/2022   History of renal cell cancer 08/09/2022   Chronic heart failure with preserved ejection fraction (HFpEF)  (HCC) 07/13/2022   Agitation 04/23/2022   RSV (respiratory syncytial virus pneumonia) 04/22/2022   Obesity (BMI 30-39.9) 04/21/2022   COPD exacerbation (HCC) 04/19/2022   Dyslipidemia 04/19/2022   Gout 04/19/2022   Right kidney mass 05/14/2021   CHF exacerbation (HCC) 04/14/2021   Chest pain 04/14/2021   Syncope 04/14/2021   Left-sided weakness 10/28/2020   Typical atrial flutter (HCC)    CHF (congestive heart failure) (HCC) 07/04/2020   Acute exacerbation of CHF (congestive heart failure) (HCC) 06/16/2020   Influenza vaccine refused 05/06/2020   Acute decompensated heart failure (HCC) 05/04/2020   Illiteracy 05/04/2020   Type 2 diabetes mellitus with stage 3 chronic kidney disease (HCC) 12/25/2019   Elevated troponin I level 10/26/2019   History of gout 02/01/2019   Seasonal allergic rhinitis due to pollen 02/01/2019   Nicotine use disorder 11/30/2018   Microscopic hematuria 11/30/2018   Depression 11/30/2018   Difficulty controlling anger 11/30/2018   COPD (chronic obstructive pulmonary disease) (HCC)    CKD (chronic kidney disease) stage 3, GFR 30-59 ml/min (HCC) 08/10/2018   Recurrent epistaxis 04/21/2018   Mixed hyperlipidemia 07/28/2017   Essential hypertension 07/28/2017   Chronic systolic heart failure (HCC) 10/25/2014   Cocaine abuse (HCC) 02/20/2013   Cannabis abuse 02/20/2013   Back pain, chronic 02/20/2013   Past Medical History:  Diagnosis Date   Arrhythmia    atrial flutter   CHF (congestive heart failure) (HCC)    Chronic kidney disease    COPD (chronic obstructive pulmonary disease) (HCC)    Coronary artery disease    Depression    Diabetes mellitus without complication (HCC)    GERD (gastroesophageal reflux disease)    Gout    Hypertension    Influenza A with respiratory manifestations    Mental disorder    Family History  Problem Relation Age of Onset   Heart disease Father    Diabetes Mother    HIV Brother    Healthy Son    Healthy Daughter     Past Surgical History:  Procedure Laterality Date   ANKLE SURGERY     CARDIAC CATHETERIZATION     CARDIOVERSION N/A 07/08/2020   Procedure: CARDIOVERSION;  Surgeon: Lamar Blinks, MD;  Location: [facility] ORS;  Service: Cardiovascular;  Laterality: N/A;   COLONOSCOPY WITH PROPOFOL N/A 11/19/2021   Procedure: COLONOSCOPY WITH PROPOFOL;  Surgeon: Jenel Lucks, MD;  Location: [facility];  Service: Gastroenterology;  Laterality: N/A;   HERNIA REPAIR     x2   IR RADIOLOGIST EVAL & MGMT  05/13/2022   IR RADIOLOGIST EVAL & MGMT  10/27/2022   IR RADIOLOGIST EVAL & MGMT  03/02/2023   POLYPECTOMY  11/19/2021   Procedure: POLYPECTOMY;  Surgeon: Jenel Lucks, MD;  Location: [facility];  Service: Gastroenterology;;   RADIOLOGY WITH ANESTHESIA N/A 06/23/2022   Procedure: Right renal tumor ablation;  Surgeon: Bennie Dallas, MD;  Location: [facility] OR;  Service: Radiology;  Laterality: N/A;   RIGHT HEART CATH N/A 07/13/2022   Procedure: RIGHT HEART CATH;  Surgeon: Laurey Morale, MD;  Location: [facility] INVASIVE CV LAB;  Service: Cardiovascular;  Laterality: N/A;   RIGHT/LEFT HEART CATH AND CORONARY ANGIOGRAPHY N/A 11/19/2022   Procedure: RIGHT/LEFT HEART CATH AND CORONARY ANGIOGRAPHY;  Surgeon: Laurey Morale, MD;  Location: [facility] INVASIVE CV LAB;  Service: Cardiovascular;  Laterality: N/A;   SHOULDER SURGERY     TEE WITHOUT CARDIOVERSION N/A 07/08/2020   Procedure: TRANSESOPHAGEAL ECHOCARDIOGRAM (TEE);  Surgeon: Lamar Blinks, MD;  Location: [facility] ORS;  Service: Cardiovascular;  Laterality: N/A;   Social History   Occupational History   Occupation: disability  Tobacco Use   Smoking status: Every Day    Current packs/day: 1.00    Average packs/day: 1 pack/day for 43.0 years (43.0 ttl pk-yrs)    Types: Cigarettes   Smokeless tobacco: Never   Tobacco comments:        6 cigs daily--03/28/2023  Vaping Use   Vaping status: Never Used  Substance and Sexual Activity   Alcohol use: Yes     Alcohol/week: 4.0 standard drinks of alcohol    Types: 4 Shots of liquor per week   Drug use: Yes    Frequency: 21.0 times per week    Types: Marijuana    Comment: last use Cocaine- 03/28/2021. Still using marijuana, last use 06/22/21   Sexual activity: Not on file

## 2023-06-01 ENCOUNTER — Ambulatory Visit (HOSPITAL_COMMUNITY): Payer: Medicaid Other | Admitting: Mental Health

## 2023-06-01 DIAGNOSIS — F331 Major depressive disorder, recurrent, moderate: Secondary | ICD-10-CM

## 2023-06-01 DIAGNOSIS — F4310 Post-traumatic stress disorder, unspecified: Secondary | ICD-10-CM

## 2023-06-01 NOTE — Progress Notes (Unsigned)
   THERAPIST PROGRESS NOTE  Session Time: 11:08 am ( 50 minutes)  Participation Level: Active  Behavioral Response: CasualAlertAdequate  Type of Therapy: Individual Therapy  Treatment Goals addressed:  STG: "My anger" [place] will decrease sxs of depression/irritability AEB development of x 3 emotional regulation skills with ability to process emotions in healthy manner per self report within the next 6 months   ProgressTowards Goals: Progressing  Interventions: CBT and Supportive  Summary:  Chris [name] is a 62 y.o. male who presents with dx of MDD severe and PTSD. Presents for therapy alert and oriented; mod and affect stable; adequate. Speech clear and coherent at normal rate and tone. Denies current concerns for low moods or feelings of anxiety. Shares to have been calm and deneis anger outburst. [name] has been working to cope with depressive thoughts as they present and not allowing self to remain stuck with them. Notes when wakes up in bad mood chooses to remain home and decline contact with others, however shares to mostly isolate self in general. Shares has been able to spend time with grand son and shares for him to have stayed a night at his home. Presented to Thanksgiving party but notes to have grabbed plate during dinner and left. Shares event of testing positive for [facility] during probation meeting with officer stating need for rehab; however denies awareness of consequences if he does not attend and denies concerns.  Denies desire to engage in SA services. Shares for mood to be stable with sleep and appetite adequate. Denies SI/HI and reports to be medication compliant.  Suicidal/Homicidal: Nowithout intent/plan  Therapist Response:  Therapist engaged [place] in therapy session. Provided safe space to share thoughts and feelings in regards to current stressors and sxs of depression and PTSD. Explored current improvements in mood and areas of progress. Supported in processing thoughts  and feelings in regards to engagement with grand children building back relationships. Explored levels of isolation from others. Discussed probation and testing for cannabis use; assessed for frequency of use and repercussions of use. Educated on options to engage in [place] treatment. Reviewed session and provided follow up.   Plan: Return again in  x7 weeks.  Diagnosis: MDD (major depressive disorder), recurrent episode, moderate (HCC)  Post traumatic stress disorder  Collaboration of Care: Other None  Patient/Guardian was advised Release of Information must be obtained prior to any record release in order to collaborate their care with an outside provider. Patient/Guardian was advised if they have not already done so to contact the registration department to sign all necessary forms in order for [place] to release information regarding their care.   Consent: Patient/Guardian gives verbal consent for treatment and assignment of benefits for services provided during this visit. Patient/Guardian expressed understanding and agreed to proceed.   Stephan Minister [place], [facility] 06/01/2023

## 2023-06-03 ENCOUNTER — Telehealth: Payer: Self-pay | Admitting: Physical Medicine and Rehabilitation

## 2023-06-03 ENCOUNTER — Other Ambulatory Visit: Payer: Self-pay | Admitting: Physical Medicine and Rehabilitation

## 2023-06-03 NOTE — Telephone Encounter (Signed)
Patient called into the office and he would like some pain meds sent in until his injection on the 7th.

## 2023-06-03 NOTE — Telephone Encounter (Signed)
Pt called in stating he is stuck in bed he is hurting so bad the Oxycodone did not help much please advise

## 2023-06-09 ENCOUNTER — Other Ambulatory Visit: Payer: Self-pay

## 2023-06-09 ENCOUNTER — Ambulatory Visit: Payer: Medicaid Other | Admitting: Physical Medicine and Rehabilitation

## 2023-06-09 ENCOUNTER — Telehealth: Payer: Self-pay | Admitting: Orthopedic Surgery

## 2023-06-09 ENCOUNTER — Telehealth: Payer: Self-pay | Admitting: Physical Medicine and Rehabilitation

## 2023-06-09 DIAGNOSIS — M5416 Radiculopathy, lumbar region: Secondary | ICD-10-CM

## 2023-06-09 MED ORDER — METHYLPREDNISOLONE ACETATE 40 MG/ML IJ SUSP
40.0000 mg | Freq: Once | INTRAMUSCULAR | Status: AC
  Administered 2023-06-09: 40 mg

## 2023-06-09 NOTE — Telephone Encounter (Signed)
Chris [name] states that he is waiting for an injection in his back.  In the meantime, he is requesting that we call in some pain medication for him.  He is having trouble walking/standing due to the pain.  He uses Pharmacologist.  Call back # is [phone].

## 2023-06-09 NOTE — Telephone Encounter (Signed)
Patient came in and wants to know if you could get him something for pain. He needed a refill for Oxycodone. And he wants an appointment to come in sooner than the 7Th. CB#[phone]

## 2023-06-09 NOTE — Procedures (Signed)
Lumbosacral Transforaminal Epidural Steroid Injection - Sub-Pedicular Approach with Fluoroscopic Guidance  Patient: Chris [name]      Date of Birth: [birth year] MRN: [id number] PCP: [name], MD      Visit Date: 06/09/2023   Universal Protocol:    Date/Time: 06/09/2023  Consent Given By: the patient  Position: PRONE  Additional Comments: Vital signs were monitored before and after the procedure. Patient was prepped and draped in the usual sterile fashion. The correct patient, procedure, and site was verified.   Injection Procedure Details:   Procedure diagnoses: Lumbar radiculopathy [M54.16]    Meds Administered:  Meds ordered this encounter  Medications   methylPREDNISolone acetate (DEPO-MEDROL) injection 40 mg    Laterality: Left  Location/Site: L5  Needle:5.0 in., 22 ga.  Short bevel or Quincke spinal needle  Needle Placement: Transforaminal  Findings:    -Comments: Excellent flow of contrast along the nerve, nerve root and into the epidural space.  Procedure Details: After squaring off the end-plates to get a true AP view, the C-arm was positioned so that an oblique view of the foramen as noted above was visualized. The target area is just inferior to the "nose of the scotty dog" or sub pedicular. The soft tissues overlying this structure were infiltrated with 2-3 ml. of 1% Lidocaine without Epinephrine.  The spinal needle was inserted toward the target using a "trajectory" view along the fluoroscope beam.  Under AP and lateral visualization, the needle was advanced so it did not puncture dura and was located close the 6 O'Clock position of the pedical in AP tracterory. Biplanar projections were used to confirm position. Aspiration was confirmed to be negative for CSF and/or blood. A 1-2 ml. volume of Isovue-250 was injected and flow of contrast was noted at each level. Radiographs were obtained for documentation purposes.   After attaining the desired  flow of contrast documented above, a 0.5 to 1.0 ml test dose of 0.25% Marcaine was injected into each respective transforaminal space.  The patient was observed for 90 seconds post injection.  After no sensory deficits were reported, and normal lower extremity motor function was noted,   the above injectate was administered so that equal amounts of the injectate were placed at each foramen (level) into the transforaminal epidural space.   Additional Comments:  The patient tolerated the procedure well Dressing: 2 x 2 sterile gauze and Band-Aid    Post-procedure details: Patient was observed during the procedure. Post-procedure instructions were reviewed.  Patient left the clinic in stable condition.

## 2023-06-09 NOTE — Progress Notes (Signed)
Chris [name] - 62 y.o. male MRN [id number]  Date of birth: [birth year]  Office Visit Note: Visit Date: 06/09/2023 PCP: [name], MD Referred by: [name], MD  Subjective: Chief Complaint  Patient presents with   Lower Back - Pain   HPI:  Chris [name] is a 62 y.o. male who comes in today at the request of Ellin Goodie, FNP for planned Left L5-S1 Lumbar Transforaminal epidural steroid injection with fluoroscopic guidance.  The patient has failed conservative care including home exercise, medications, time and activity modification.  This injection will be diagnostic and hopefully therapeutic.  Please see requesting physician notes for further details and justification.   ROS Otherwise per HPI.  Assessment & Plan: Visit Diagnoses:    ICD-10-CM   1. Lumbar radiculopathy  M54.16 XR C-ARM NO REPORT    Epidural Steroid injection    methylPREDNISolone acetate (DEPO-MEDROL) injection 40 mg      Plan: No additional findings.   Meds & Orders:  Meds ordered this encounter  Medications   methylPREDNISolone acetate (DEPO-MEDROL) injection 40 mg    Orders Placed This Encounter  Procedures   XR C-ARM NO REPORT   Epidural Steroid injection    Follow-up: Return for visit to requesting provider as needed.   Procedures: No procedures performed  Lumbosacral Transforaminal Epidural Steroid Injection - Sub-Pedicular Approach with Fluoroscopic Guidance  Patient: Chris [name]      Date of Birth: [birth year] MRN: [id number] PCP: [name], MD      Visit Date: 06/09/2023   Universal Protocol:    Date/Time: 06/09/2023  Consent Given By: the patient  Position: PRONE  Additional Comments: Vital signs were monitored before and after the procedure. Patient was prepped and draped in the usual sterile fashion. The correct patient, procedure, and site was verified.   Injection Procedure Details:   Procedure diagnoses: Lumbar radiculopathy [M54.16]     Meds Administered:  Meds ordered this encounter  Medications   methylPREDNISolone acetate (DEPO-MEDROL) injection 40 mg    Laterality: Left  Location/Site: L5  Needle:5.0 in., 22 ga.  Short bevel or Quincke spinal needle  Needle Placement: Transforaminal  Findings:    -Comments: Excellent flow of contrast along the nerve, nerve root and into the epidural space.  Procedure Details: After squaring off the end-plates to get a true AP view, the C-arm was positioned so that an oblique view of the foramen as noted above was visualized. The target area is just inferior to the "nose of the scotty dog" or sub pedicular. The soft tissues overlying this structure were infiltrated with 2-3 ml. of 1% Lidocaine without Epinephrine.  The spinal needle was inserted toward the target using a "trajectory" view along the fluoroscope beam.  Under AP and lateral visualization, the needle was advanced so it did not puncture dura and was located close the 6 O'Clock position of the pedical in AP tracterory. Biplanar projections were used to confirm position. Aspiration was confirmed to be negative for CSF and/or blood. A 1-2 ml. volume of Isovue-250 was injected and flow of contrast was noted at each level. Radiographs were obtained for documentation purposes.   After attaining the desired flow of contrast documented above, a 0.5 to 1.0 ml test dose of 0.25% Marcaine was injected into each respective transforaminal space.  The patient was observed for 90 seconds post injection.  After no sensory deficits were reported, and normal lower extremity motor function was noted,   the above injectate  was administered so that equal amounts of the injectate were placed at each foramen (level) into the transforaminal epidural space.   Additional Comments:  The patient tolerated the procedure well Dressing: 2 x 2 sterile gauze and Band-Aid    Post-procedure details: Patient was observed during the  procedure. Post-procedure instructions were reviewed.  Patient left the clinic in stable condition.    Clinical History: CLINICAL DATA:  Low back pain radiating to the left hip and ankle   EXAM: MRI LUMBAR SPINE WITHOUT CONTRAST   TECHNIQUE: Multiplanar, multisequence MR imaging of the lumbar spine was performed. No intravenous contrast was administered.   COMPARISON:  None Available.   FINDINGS: Segmentation:  Standard.   Alignment:  Physiologic.   Vertebrae:  No fracture, evidence of discitis, or bone lesion.   Conus medullaris and cauda equina: Conus extends to the L1 level. Conus and cauda equina appear normal.   Paraspinal and other soft tissues: Negative   Disc levels:   L1-L2: Normal disc space and facet joints. No spinal canal stenosis. No neural foraminal stenosis.   L2-L3: Normal disc space and facet joints. No spinal canal stenosis. No neural foraminal stenosis.   L3-L4: Intermediate sized disc bulge with mild facet hypertrophy. No spinal canal stenosis. Mild left neural foraminal stenosis.   L4-L5: Large left asymmetric disc bulge with narrowing of the left lateral recess and mass effect on the left L5 nerve root. Mild facet hypertrophy. No central spinal canal stenosis. Severe left neural foraminal stenosis.   L5-S1: Mild facet hypertrophy. No spinal canal stenosis. Mild left neural foraminal stenosis.   Visualized sacrum: Normal.   IMPRESSION: 1. Large left asymmetric disc bulge at L4-L5 with narrowing of the left lateral recess and mass effect on the left L5 nerve root. Severe left neural foraminal stenosis. 2. Mild left L3-L4 and L5-S1 neural foraminal stenosis.     Electronically Signed   By: Deatra Robinson M.D.   On: 05/21/2023 02:40 --- IMPRESSION: 1. 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 2. 1.4 cm osteochondral lesion along the superomedial portion of the right (contralateral) femoral head. 3. Abnormal marrow edema  medially along the posterior pubic bones bilaterally, without an obvious or large tear of the adjoining hip adductor aponeurosis. This could be from stress reaction or bone contusion. 4. Proximal hamstring tendinopathy bilaterally. Partial tearing of the contralateral (right) hamstring tendon proximally. 5. Amorphous accentuated signal in the left anterior labrum suspicious for a degenerative anterior superior labral tear. I do note that on the CT of 06/29/2022 there is chondrocalcinosis in the labrum, this can sometimes cause false-positive assessment for labral tear.     Electronically Signed   By: Gaylyn Rong M.D.   On: 05/21/2023 11:38     Objective:  VS:  HT:    WT:   BMI:     BP:   HR: bpm  TEMP: ( )  RESP:  Physical Exam Vitals and nursing note reviewed.  Constitutional:      General: He is not in acute distress.    Appearance: Normal appearance. He is not ill-appearing.  HENT:     Head: Normocephalic and atraumatic.     Right Ear: External ear normal.     Left Ear: External ear normal.     Nose: No congestion.  Eyes:     Extraocular Movements: Extraocular movements intact.  Cardiovascular:     Rate and Rhythm: Normal rate.     Pulses: Normal pulses.  Pulmonary:  Effort: Pulmonary effort is normal. No respiratory distress.  Abdominal:     General: There is no distension.     Palpations: Abdomen is soft.  Musculoskeletal:        General: No tenderness or signs of injury.     Cervical back: Neck supple.     Right lower leg: No edema.     Left lower leg: No edema.     Comments: Patient has good distal strength without clonus.  Skin:    Findings: No erythema or rash.  Neurological:     General: No focal deficit present.     Mental Status: He is alert and oriented to person, place, and time.     Sensory: No sensory deficit.     Motor: No weakness or abnormal muscle tone.     Coordination: Coordination normal.  Psychiatric:        Mood and  Affect: Mood normal.        Behavior: Behavior normal.      Imaging: No results found.

## 2023-06-09 NOTE — Progress Notes (Signed)
Functional Pain Scale - descriptive words and definitions  Uncomfortable (3)  Pain is present but can complete all ADL's/sleep is slightly affected and passive distraction only gives marginal relief. Mild range order  Average Pain 3  L>R +Driver, -BT, -Dye Allergies.

## 2023-06-09 NOTE — Patient Instructions (Signed)

## 2023-06-13 ENCOUNTER — Other Ambulatory Visit: Payer: Self-pay | Admitting: Licensed Clinical Social Worker

## 2023-06-13 NOTE — Patient Instructions (Signed)
Visit Information  Chris [name] was given information about Medicaid Managed Care team care coordination services as a part of their Healthy [facility] Medicaid benefit. Chris [name] verbally consented to engagement with the [facility] Managed Care team.   If you are experiencing a medical emergency, please call 911 or report to your local emergency department or urgent care.   If you have a non-emergency medical problem during routine business hours, please contact your provider's office and ask to speak with a nurse.   For questions related to your Healthy [facility] health plan, please call: [phone] or visit the homepage here: [email or web address]  If you would like to schedule transportation through your Healthy [facility] plan, please call the following number at least 2 days in advance of your appointment: [phone]  For information about your ride after you set it up, call Ride Assist at [phone]. Use this number to activate a Will Call pickup, or if your transportation is late for a scheduled pickup. Use this number, too, if you need to make a change or cancel a previously scheduled reservation.  If you need transportation services right away, call [phone]. The after-hours call center is staffed 24 hours to handle ride assistance and urgent reservation requests (including discharges) 365 days a year. Urgent trips include sick visits, hospital discharge requests and life-sustaining treatment.  Call the [facility] Line at [phone], at any time, 24 hours a day, 7 days a week. If you are in danger or need immediate medical attention call 911.  If you would like help to quit smoking, call 1-800-QUIT-NOW ((603) 041-3663) OR Espaol: 1-855-Djelo-Ya (7-616-073-7106) o para ms informacin haga clic aqu or Text READY to 269-485 to register via text    Following is a copy of your plan of care:  Care Plan : General Social Work  (Adult)  Updates made by Gustavus Bryant, LCSW since 06/13/2023 12:00 AM     Problem: Depression Identification (Depression)      Long-Range Goal: To increase my self care and gain mental health support   Start Date: 07/08/2022  Priority: High  Note:   Timeframe:  Long-Range Goal Priority:  High Start Date:   07/08/22                    Expected End Date:ongoing    Follow Up Date- 08/12/23 at 9:15 am   - begin personal counseling - call and visit an old friend - check out volunteer opportunities - join a support group - laugh; watch a funny movie or comedian - learn and use visualization or guided imagery - perform a random act of kindness - practice relaxation or meditation daily - start or continue a personal journal - talk about feelings with a friend, family or spiritual advisor - practice positive thinking and self-talk    Why is this important?   When you are stressed, down or upset, your body reacts too.  For example, your blood pressure may get higher; you may have a headache or stomachache.  When your emotions get the best of you, your body's ability to fight off cold and flu gets weak.  These steps will help you manage your emotions.   Current Barriers:  Chronic Mental Health needs related to depression and need for housing Limited social support, Housing barriers, and Mental Health Concerns  Social Isolation ADL IADL limitations Suicidal Ideation/Homicidal Ideation: No  Clinical Social Work Goal(s):  Over the next 120 days, patient will work with [employer]  monthly by telephone or in person to reduce or manage symptoms related to depression and relapse prevention patient will work with [name] to address needs related to financial, food and transportation support. Patient wishes to offset medical and housing expenses with financial support as housing resources are very limited at this time  Patient Self Care Activities:  Ability for insight Agreement to start mental  health treatment and to the self-care journey   Patient Coping Strengths:  Self Advocate Able to Communicate Effectively  Patient Self Care Deficits:  Lacks social connections  The following coping skill education was provided for stress relief and mental health management: "When your car dies or a deadline looms, how do you respond? Long-term, low-grade or acute stress takes a serious toll on your body and mind, so don't ignore feelings of constant tension. Stress is a natural part of life. However, too much stress can harm our health, especially if it continues every day. This is chronic stress and can put you at risk for heart problems like heart disease and depression. Understand what's happening inside your body and learn simple coping skills to combat the negative impacts of everyday stressors.  Types of Stress There are two types of stress: Emotional - types of emotional stress are relationship problems, pressure at work, financial worries, experiencing discrimination or having a major life change. Physical - Examples of physical stress include being sick having pain, not sleeping well, recovery from an injury or having an alcohol and drug use disorder. Fight or Flight Sudden or ongoing stress activates your nervous system and floods your bloodstream with adrenaline and cortisol, two hormones that raise blood pressure, increase heart rate and spike blood sugar. These changes pitch your body into a fight or flight response. That enabled our ancestors to outrun saber-toothed tigers, and it's helpful today for situations like dodging a car accident. But most modern chronic stressors, such as finances or a challenging relationship, keep your body in that heightened state, which hurts your health. Effects of Too Much Stress If constantly under stress, most of [place] will eventually start to function less well.  Multiple studies link chronic stress to a higher risk of heart disease, stroke,  depression, weight gain, memory loss and even premature death, so it's important to recognize the warning signals. Talk to your doctor about ways to manage stress if you're experiencing any of these symptoms: Prolonged periods of poor sleep. Regular, severe headaches. Unexplained weight loss or gain. Feelings of isolation, withdrawal or worthlessness. Constant anger and irritability. Loss of interest in activities. Constant worrying or obsessive thinking. Excessive alcohol or drug use. Inability to concentrate.  10 Ways to Cope with Chronic Stress It's key to recognize stressful situations as they occur because it allows you to focus on managing how you react. We all need to know when to close our eyes and take a deep breath when we feel tension rising. Use these tips to prevent or reduce chronic stress. 1. Rebalance Work and Home All work and no play? If you're spending too much time at the office, intentionally put more dates in your calendar to enjoy time for fun, either alone or with others. 2. Get Regular Exercise Moving your body on a regular basis balances the nervous system and increases blood circulation, helping to flush out stress hormones. Even a daily 20-minute walk makes a difference. Any kind of exercise can lower stress and improve your mood ? just pick activities that you enjoy and make it a regular  habit. 3. Eat Well and Limit Alcohol and Stimulants Alcohol, nicotine and caffeine may temporarily relieve stress but have negative health impacts and can make stress worse in the long run. Well-nourished bodies cope better, so start with a good breakfast, add more organic fruits and vegetables for a well-balanced diet, avoid processed foods and sugar, try herbal tea and drink more water. 4. Connect with Supportive People Talking face to face with another person releases hormones that reduce stress. Lean on those good listeners in your life. 5. Carve Out Hobby Time Do you enjoy  gardening, reading, listening to music or some other creative pursuit? Engage in activities that bring you pleasure and joy; research shows that reduces stress by almost half and lowers your heart rate, too. 6. Practice Meditation, Stress Reduction or Yoga Relaxation techniques activate a state of restfulness that counterbalances your body's fight-or-flight hormones. Even if this also means a 10-minute break in a long day: listen to music, read, go for a walk in nature, do a hobby, take a bath or spend time with a friend. Also consider doing a mindfulness exercise or try a daily deep breathing or imagery practice. Deep Breathing Slow, calm and deep breathing can help you relax. Try these steps to focus on your breathing and repeat as needed. Find a comfortable position and close your eyes. Exhale and drop your shoulders. Breathe in through your nose; fill your lungs and then your belly. Think of relaxing your body, quieting your mind and becoming calm and peaceful. Breathe out slowly through your nose, relaxing your belly. Think of releasing tension, pain, worries or distress. Repeat steps three and four until you feel relaxed. Imagery This involves using your mind to excite the senses -- sound, vision, smell, taste and feeling. This may help ease your stress. Begin by getting comfortable and then do some slow breathing. Imagine a place you love being at. It could be somewhere from your childhood, somewhere you vacationed or just a place in your imagination. Feel how it is to be in the place you're imagining. Pay attention to the sounds, air, colors, and who is there with you. This is a place where you feel cared for and loved. All is well. You are safe. Take in all the smells, sounds, tastes and feelings. As you do, feel your body being nourished and healed. Feel the calm that surrounds you. Breathe in all the good. Breathe out any discomfort or tension. 7. Sleep Enough If you get less than seven  to eight hours of sleep, your body won't tolerate stress as well as it could. If stress keeps you up at night, address the cause, and add extra meditation into your day to make up for the lost z's. Try to get seven to nine hours of sleep each night. Make a regular bedtime schedule. Keep your room dark and cool. Try to avoid computers, TV, cell phones and tablets before bed. 8. Bond with Connections You Enjoy Go out for a coffee with a friend, chat with a neighbor, call a family member, visit with a clergy member, or even hang out with your pet. Clinical studies show that spending even a short time with a companion animal can cut anxiety levels almost in half. 9. Take a Vacation Getting away from it all can reset your stress tolerance by increasing your mental and emotional outlook, which makes you a happier, more productive person upon return. Leave your cellphone and laptop at home! 10. See a Veterinary surgeon, Coach or  Therapist If negative thoughts overwhelm your ability to make positive changes, it's time to seek professional help. Make an appointment today--your health and life are worth it."     24- Hour Availability:    [facility]  [address] [place], [place] [phone] Crisis [phone]   [facility] [phone]   [place] [facility]  [phone]    [facility]  [phone] (after hours)   Therapeutic Alternative/Mobile Crisis   [phone]   [place] National Suicide Hotline  [phone] [place]) [place] [address]   Call 911 or go to emergency room   [facility]  [phone]);  [place] and [employer]  [phone]); [place], [place], [place], [place], [place], [place], [place]         Follow up goal  Dickie La, [name], MSW, [place] [employer] [place] Licensed Clinical Social Worker [employer]   [email or web address] Direct Dial:  [phone]

## 2023-06-13 NOTE — Patient Outreach (Signed)
Medicaid Managed Care Social Work Note  06/13/2023 Name:  Chris [name] MRN:  [id number] DOB:  1961/04/04  DRACEN [name] is an 62 y.o. year old male who is a primary patient of Chris [name].  The Medicaid Managed Care Coordination team was consulted for assistance with:  Mental Health Counseling and Resources  Mr. Fulford was given information about Medicaid Managed Care Coordination team services today. Chris [name] Patient agreed to services and verbal consent obtained.  Engaged with patient  for by telephone forfollow up visit in response to referral for case management and/or care coordination services.   Patient is participating in a Managed Medicaid Plan:  Yes  Assessments/Interventions:  Review of past medical history, allergies, medications, health status, including review of consultants reports, laboratory and other test data, was performed as part of comprehensive evaluation and provision of chronic care management services.  SDOH: (Social Drivers of Health) assessments and interventions performed: SDOH Interventions    Flowsheet Row Patient Outreach Telephone from 06/13/2023 in [place] [facility] POPULATION HEALTH DEPARTMENT Patient Outreach Telephone from 05/19/2023 in [place] POPULATION HEALTH DEPARTMENT Patient Outreach Telephone from 03/30/2023 in [place] POPULATION HEALTH DEPARTMENT Patient Outreach Telephone from 03/09/2023 in [place] POPULATION HEALTH DEPARTMENT Patient Outreach Telephone from 03/04/2023 in [place] POPULATION HEALTH DEPARTMENT Patient Outreach Telephone from 01/17/2023 in [place] POPULATION HEALTH DEPARTMENT  SDOH Interventions        Food Insecurity Interventions -- -- -- Intervention Not Indicated -- --  Housing Interventions -- -- -- Intervention Not Indicated -- --  Transportation Interventions -- Payor Benefit -- -- -- --  Utilities Interventions -- Other (Comment)  [[name] referral for utilities] -- -- -- --  Financial Strain  Interventions -- -- -- -- Other (Comment)  [[facility] [name] involved and has a F/U appt] --  Stress Interventions Community Resources Provided -- [employer], Provide Counseling  [Pt reports his BH meds continues to help with anxiety and sleep but he is struggling with pain in his hip at this time which led to a recent ED visit] -- [employer], Provide Counseling Offered [employer], Provide Counseling       Advanced Directives Status:  See Care Plan for related entries.  Care Plan                 No Known Allergies  Medications Reviewed Today   Medications were not reviewed in this encounter     Patient Active Problem List   Diagnosis Date Noted   Post traumatic stress disorder 09/23/2022   MDD (major depressive disorder), recurrent episode, moderate (HCC) 08/26/2022   Stage 3b chronic kidney disease (HCC) 08/09/2022   Hypertrophic cardiomyopathy (HCC) 08/09/2022   History of renal cell cancer 08/09/2022   Chronic heart failure with preserved ejection fraction (HFpEF) (HCC) 07/13/2022   Agitation 04/23/2022   RSV (respiratory syncytial virus pneumonia) 04/22/2022   Obesity (BMI 30-39.9) 04/21/2022   COPD exacerbation (HCC) 04/19/2022   Dyslipidemia 04/19/2022   Gout 04/19/2022   Right kidney mass 05/14/2021   CHF exacerbation (HCC) 04/14/2021   Chest pain 04/14/2021   Syncope 04/14/2021   Left-sided weakness 10/28/2020   Typical atrial flutter (HCC)    CHF (congestive heart failure) (HCC) 07/04/2020   Acute exacerbation of CHF (congestive heart failure) (HCC) 06/16/2020   Influenza vaccine refused 05/06/2020   Acute decompensated heart failure (HCC) 05/04/2020   Illiteracy 05/04/2020   Type 2 diabetes mellitus with stage 3  chronic kidney disease (HCC) 12/25/2019   Elevated troponin I level 10/26/2019   History of gout 02/01/2019   Seasonal allergic rhinitis due to pollen 02/01/2019   Nicotine use disorder  11/30/2018   Microscopic hematuria 11/30/2018   Depression 11/30/2018   Difficulty controlling anger 11/30/2018   COPD (chronic obstructive pulmonary disease) (HCC)    CKD (chronic kidney disease) stage 3, GFR 30-59 ml/min (HCC) 08/10/2018   Recurrent epistaxis 04/21/2018   Mixed hyperlipidemia 07/28/2017   Essential hypertension 07/28/2017   Chronic systolic heart failure (HCC) 10/25/2014   Cocaine abuse (HCC) 02/20/2013   Cannabis abuse 02/20/2013   Back pain, chronic 02/20/2013    Conditions to be addressed/monitored per PCP order:  Depression  Care Plan : General Social Work (Adult)  Updates made by Chris Bryant, LCSW since 06/13/2023 12:00 AM     Problem: Depression Identification (Depression)      Long-Range Goal: To increase my self care and gain mental health support   Start Date: 07/08/2022  Priority: High  Note:   Timeframe:  Long-Range Goal Priority:  High Start Date:   07/08/22                    Expected End Date:ongoing    Follow Up Date- 08/12/23 at 9:15 am   - begin personal counseling - call and visit an old friend - check out volunteer opportunities - join a support group - laugh; watch a funny movie or comedian - learn and use visualization or guided imagery - perform a random act of kindness - practice relaxation or meditation daily - start or continue a personal journal - talk about feelings with a friend, family or spiritual advisor - practice positive thinking and self-talk    Why is this important?   When you are stressed, down or upset, your body reacts too.  For example, your blood pressure may get higher; you may have a headache or stomachache.  When your emotions get the best of you, your body's ability to fight off cold and flu gets weak.  These steps will help you manage your emotions.   Current Barriers:  Chronic Mental Health needs related to depression and need for housing Limited social support, Housing barriers, and Mental  Health Concerns  Social Isolation ADL IADL limitations Suicidal Ideation/Homicidal Ideation: No  Clinical Social Work Goal(s):  Over the next 120 days, patient will work with LCSW monthly by telephone or in person to reduce or manage symptoms related to depression and relapse prevention patient will work with [name] to address needs related to financial, food and transportation support. Patient wishes to offset medical and housing expenses with financial support as housing resources are very limited at this time  Interventions: Patient interviewed and appropriate assessments performed: brief mental health assessment Discussed plans with patient for ongoing care management follow up and provided patient with direct contact information for care management team Assisted patient/caregiver with obtaining information about health plan benefits Patient was educated on the Advance Directives process. Patient was encouraged to complete document with PCP.  [facility] LCSW provided emotional support and reflective listening.  Solution-Focused Strategies, Mindfulness or Relaxation Training, Active listening / Reflection utilized , Emotional Supportive Provided, and Verbalization of feelings encouraged  Patient declines any current substance use. Patient has a history of cocaine abuse. Relapse prevention education provided.  Patient reports that he has developed social anxiety. He admits that he gets irritable at times and will snap at his loved ones and  then will experience guilt afterwards. Educated patient on coping methods to implement into his daily life to combat anxiety symptoms and stress. Patient denied any current suicidal or homicidal ideations. Encouraged patient to implement deep breathing and grounding exercises into his daily routine due to ongoing anxiety and SOB.  Patient reports that he will start implementing appropriate self-care habits into his daily routine such as: drinking water, staying active  around the house, taking his medications as prescribed, combating negative thoughts or emotions and staying connected with his support network. Positive reinforcement provided.  Past Update- Patient missed his psychiatry appointment yesterday at [facility] which makes the second no show. He is at risk of not being able to be seen at their practice and was advised to contact them today to inquire. Patient reports that he will be able to attend his upcoming counseling appointment on 03/15/23. [facility] LCSW provided extensive self-care education which including keeping up with his appointments. Patient was advised to use a calendar to assist with his scheduling. Patient reports that he is stable and his mood management continues to improve. 03/30/23 update- Patient reports that he was ready for his follow up psychiatry appointment this month and his Medicaid transportation did not arrive so he had to complete a video visit with his provider which thankfully was able to accommodate. [facility] LCSW reminded patient of his new follow up behavioral health appointments. Patient reports that his [facility] medicine continues to help with his sleep and anxiety but now he is experiencing more physical pain in his hip area which led to a recent ED visit. [facility] LCSW advised pt to discuss pain management options with his PCP. He was advised to contact PCP office today. [facility] LCSW provided brief self-care and anxiety management coping skill education. 06/13/23- Patient successfully completed her follow up therapy appointment with his counselor at [facility] and has follow up appointments with both counselor and psychiatrist next month. He reports that he is in extreme pain and discomfort and may go to the urgent care center or ED today if his symptoms do not improve. [facility] LCSW will update PCP and [facility] care team. Crisis resources successfully provided to patient today as well as self-care tips specifically for anxiety/pain management.   Patient Self Care  Activities:  Ability for insight Agreement to start mental health treatment and to the self-care journey   Patient Coping Strengths:  Self Advocate Able to Communicate Effectively  Patient Self Care Deficits:  Lacks social connections  The following coping skill education was provided for stress relief and mental health management: "When your car dies or a deadline looms, how do you respond? Long-term, low-grade or acute stress takes a serious toll on your body and mind, so don't ignore feelings of constant tension. Stress is a natural part of life. However, too much stress can harm our health, especially if it continues every day. This is chronic stress and can put you at risk for heart problems like heart disease and depression. Understand what's happening inside your body and learn simple coping skills to combat the negative impacts of everyday stressors.  Types of Stress There are two types of stress: Emotional - types of emotional stress are relationship problems, pressure at work, financial worries, experiencing discrimination or having a major life change. Physical - Examples of physical stress include being sick having pain, not sleeping well, recovery from an injury or having an alcohol and drug use disorder. Fight or Flight Sudden or ongoing stress activates your nervous system and  floods your bloodstream with adrenaline and cortisol, two hormones that raise blood pressure, increase heart rate and spike blood sugar. These changes pitch your body into a fight or flight response. That enabled our ancestors to outrun saber-toothed tigers, and it's helpful today for situations like dodging a car accident. But most modern chronic stressors, such as finances or a challenging relationship, keep your body in that heightened state, which hurts your health. Effects of Too Much Stress If constantly under stress, most of [place] will eventually start to function less well.  Multiple studies link  chronic stress to a higher risk of heart disease, stroke, depression, weight gain, memory loss and even premature death, so it's important to recognize the warning signals. Talk to your doctor about ways to manage stress if you're experiencing any of these symptoms: Prolonged periods of poor sleep. Regular, severe headaches. Unexplained weight loss or gain. Feelings of isolation, withdrawal or worthlessness. Constant anger and irritability. Loss of interest in activities. Constant worrying or obsessive thinking. Excessive alcohol or drug use. Inability to concentrate.  10 Ways to Cope with Chronic Stress It's key to recognize stressful situations as they occur because it allows you to focus on managing how you react. We all need to know when to close our eyes and take a deep breath when we feel tension rising. Use these tips to prevent or reduce chronic stress. 1. Rebalance Work and Home All work and no play? If you're spending too much time at the office, intentionally put more dates in your calendar to enjoy time for fun, either alone or with others. 2. Get Regular Exercise Moving your body on a regular basis balances the nervous system and increases blood circulation, helping to flush out stress hormones. Even a daily 20-minute walk makes a difference. Any kind of exercise can lower stress and improve your mood ? just pick activities that you enjoy and make it a regular habit. 3. Eat Well and Limit Alcohol and Stimulants Alcohol, nicotine and caffeine may temporarily relieve stress but have negative health impacts and can make stress worse in the long run. Well-nourished bodies cope better, so start with a good breakfast, add more organic fruits and vegetables for a well-balanced diet, avoid processed foods and sugar, try herbal tea and drink more water. 4. Connect with Supportive People Talking face to face with another person releases hormones that reduce stress. Lean on those good  listeners in your life. 5. Carve Out Hobby Time Do you enjoy gardening, reading, listening to music or some other creative pursuit? Engage in activities that bring you pleasure and joy; research shows that reduces stress by almost half and lowers your heart rate, too. 6. Practice Meditation, Stress Reduction or Yoga Relaxation techniques activate a state of restfulness that counterbalances your body's fight-or-flight hormones. Even if this also means a 10-minute break in a long day: listen to music, read, go for a walk in nature, do a hobby, take a bath or spend time with a friend. Also consider doing a mindfulness exercise or try a daily deep breathing or imagery practice. Deep Breathing Slow, calm and deep breathing can help you relax. Try these steps to focus on your breathing and repeat as needed. Find a comfortable position and close your eyes. Exhale and drop your shoulders. Breathe in through your nose; fill your lungs and then your belly. Think of relaxing your body, quieting your mind and becoming calm and peaceful. Breathe out slowly through your nose, relaxing your belly. Think  of releasing tension, pain, worries or distress. Repeat steps three and four until you feel relaxed. Imagery This involves using your mind to excite the senses -- sound, vision, smell, taste and feeling. This may help ease your stress. Begin by getting comfortable and then do some slow breathing. Imagine a place you love being at. It could be somewhere from your childhood, somewhere you vacationed or just a place in your imagination. Feel how it is to be in the place you're imagining. Pay attention to the sounds, air, colors, and who is there with you. This is a place where you feel cared for and loved. All is well. You are safe. Take in all the smells, sounds, tastes and feelings. As you do, feel your body being nourished and healed. Feel the calm that surrounds you. Breathe in all the good. Breathe out any  discomfort or tension. 7. Sleep Enough If you get less than seven to eight hours of sleep, your body won't tolerate stress as well as it could. If stress keeps you up at night, address the cause, and add extra meditation into your day to make up for the lost z's. Try to get seven to nine hours of sleep each night. Make a regular bedtime schedule. Keep your room dark and cool. Try to avoid computers, TV, cell phones and tablets before bed. 8. Bond with Connections You Enjoy Go out for a coffee with a friend, chat with a neighbor, call a family member, visit with a clergy member, or even hang out with your pet. Clinical studies show that spending even a short time with a companion animal can cut anxiety levels almost in half. 9. Take a Vacation Getting away from it all can reset your stress tolerance by increasing your mental and emotional outlook, which makes you a happier, more productive person upon return. Leave your cellphone and laptop at home! 10. See a Counselor, Coach or Therapist If negative thoughts overwhelm your ability to make positive changes, it's time to seek professional help. Make an appointment today--your health and life are worth it."     24- Hour Availability:    [facility]  [address] [place], [place] [phone] Crisis [phone]   [facility] [phone]   [place] [facility]  [phone]    [facility]  [phone] (after hours)   Therapeutic Alternative/Mobile Crisis   [phone]   [place] National Suicide Hotline  [phone] [place]) [place] 427   Call 911 or go to emergency room   [facility]  [phone]);  [place] and [employer]  [phone]); [place], [place], [place], [place], [place], [place], [place]         Follow up goal      Follow up:  Patient agrees to Care Plan and Follow-up.  Plan: The Managed  Medicaid care management team will reach out to the patient again over the next 60 days.  Dickie La, [name], MSW, LCSW Licensed Clinical Social Worker [employer]   [email or web address] Direct Dial: [phone]

## 2023-06-16 ENCOUNTER — Other Ambulatory Visit: Payer: Self-pay

## 2023-06-16 ENCOUNTER — Emergency Department: Payer: Medicaid Other

## 2023-06-16 ENCOUNTER — Emergency Department
Admission: EM | Admit: 2023-06-16 | Discharge: 2023-06-16 | Disposition: A | Discharge status: Home / Self Care | Payer: Medicaid Other | Attending: Emergency Medicine | Admitting: Emergency Medicine

## 2023-06-16 ENCOUNTER — Encounter: Payer: Self-pay | Admitting: Emergency Medicine

## 2023-06-16 DIAGNOSIS — I509 Heart failure, unspecified: Secondary | ICD-10-CM | POA: Diagnosis not present

## 2023-06-16 DIAGNOSIS — I2510 Atherosclerotic heart disease of native coronary artery without angina pectoris: Secondary | ICD-10-CM | POA: Insufficient documentation

## 2023-06-16 DIAGNOSIS — M1611 Unilateral primary osteoarthritis, right hip: Secondary | ICD-10-CM | POA: Diagnosis not present

## 2023-06-16 DIAGNOSIS — N189 Chronic kidney disease, unspecified: Secondary | ICD-10-CM | POA: Insufficient documentation

## 2023-06-16 DIAGNOSIS — I130 Hypertensive heart and chronic kidney disease with heart failure and stage 1 through stage 4 chronic kidney disease, or unspecified chronic kidney disease: Secondary | ICD-10-CM | POA: Insufficient documentation

## 2023-06-16 DIAGNOSIS — E1122 Type 2 diabetes mellitus with diabetic chronic kidney disease: Secondary | ICD-10-CM | POA: Diagnosis not present

## 2023-06-16 DIAGNOSIS — J449 Chronic obstructive pulmonary disease, unspecified: Secondary | ICD-10-CM | POA: Insufficient documentation

## 2023-06-16 DIAGNOSIS — M25551 Pain in right hip: Secondary | ICD-10-CM | POA: Diagnosis not present

## 2023-06-16 MED ORDER — MELOXICAM 15 MG PO TABS: 15.0000 mg | ORAL_TABLET | Freq: Every day | ORAL | 0 refills | Status: DC

## 2023-06-16 NOTE — Patient Outreach (Signed)
.     Medicaid Managed Care   Unsuccessful Outreach Note  06/16/2023 Name: Chris [name] MRN: [id number] DOB: 01-28-1961  Referred by: Louder Barnie NOVAK, MD Reason for referral : High Risk Managed Medicaid (MM social work unsuccessful telephone outreach )   A second unsuccessful telephone outreach was attempted today. The patient was referred to the case management team for assistance with care management and care coordination.   Follow Up Plan: A HIPAA compliant phone message was left for the patient providing contact information and requesting a return call.   Patient is currently in ED   Thersia Hoar, [name], [facility]  Managed [facility] Social Worker [phone]

## 2023-06-16 NOTE — ED Provider Notes (Signed)
 [facility] Provider Note    Event Date/Time   First MD Initiated Contact with Patient 06/16/23 1011     (approximate)   History   Hip Pain   HPI  Chris [name] is a 63 y.o. male with a history of hypertension, CKD, COPD, type 2 diabetes, CHF, polysubstance abuse, CAD and as listed in EMR presents to the emergency department for treatment and evaluation of acute on chronic right hip pain.  Pain is described as a pulling sensation in the right lateral hip with movement and activity.  Currently rates pain 10 out of 10.  No new injury.  He had been taking oxycodone  for some back and left hip and leg pain but is now out of it.  Pain is causing him not to be able to sleep.      Physical Exam   Triage Vital Signs: ED Triage Vitals  Encounter Vitals Group     BP 06/16/23 0935 (!) 124/90     Systolic BP Percentile --      Diastolic BP Percentile --      Pulse Rate 06/16/23 0935 83     Resp 06/16/23 0935 18     Temp 06/16/23 0935 (!) 97.5 F (36.4 C)     Temp Source 06/16/23 0935 Oral     SpO2 06/16/23 0935 98 %     Weight 06/16/23 0934 270 lb (122.5 kg)     Height 06/16/23 0934 6' (1.829 m)     Head Circumference --      Peak Flow --      Pain Score 06/16/23 0934 10     Pain Loc --      Pain Education --      Exclude from Growth Chart --     Most recent vital signs: Vitals:   06/16/23 0935  BP: (!) 124/90  Pulse: 83  Resp: 18  Temp: (!) 97.5 F (36.4 C)  SpO2: 98%    General: Awake, no distress.  CV:  Good peripheral perfusion.  Resp:  Normal effort.  Abd:  No distention.  Other:  Tenderness to palpation over lateral aspect of right hip. Patient able to bear weight and pivot.   ED Results / Procedures / Treatments   Labs (all labs ordered are listed, but only abnormal results are displayed) Labs Reviewed - No data to display   EKG  Not indicated.   RADIOLOGY  Image and radiology report reviewed and interpreted by me.  Radiology report consistent with the same.  Image of right hip shows osteoarthritis.  PROCEDURES:  Critical Care performed: No  Procedures   MEDICATIONS ORDERED IN ED:  Medications - No data to display   IMPRESSION / MDM / ASSESSMENT AND PLAN / ED COURSE   I have reviewed the triage note.  Differential diagnosis includes, but is not limited to, osteoarthritis, hip strain, bursitis  Patient's presentation is most consistent with exacerbation of chronic illness.  63 year old male presenting to the emergency department for treatment and evaluation of ongoing right hip pain that seems to have been worse the past few days.  He states that the pain is much worse with attempt to abduct or externally rotate.  No new injury.  Pain is no different than usual but worse.  He had been on oxycodone  due to back and left hip pain but he has taken all of it.  Image of the right hip shows no acute injury.  He does have osteoarthritis.  Plan will be to treat him with meloxicam  and have him follow-up with his primary care provider or orthopedist if not improving over the next several days.      FINAL CLINICAL IMPRESSION(S) / ED DIAGNOSES   Final diagnoses:  Osteoarthritis of right hip, unspecified osteoarthritis type     Rx / DC Orders   ED Discharge Orders          Ordered    meloxicam  (MOBIC ) 15 MG tablet  Daily        06/16/23 1158             Note:  This document was prepared using Dragon voice recognition software and may include unintentional dictation errors.   Herlinda Kirk NOVAK, FNP 06/16/23 1203    Levander Slate, MD 06/16/23 1524

## 2023-06-16 NOTE — Patient Instructions (Signed)
  Medicaid Managed Care   Unsuccessful Outreach Note  06/16/2023 Name: Chris [name] MRN: [id number] DOB: 09-Sep-1960  Referred by: Louder Barnie NOVAK, MD Reason for referral : High Risk Managed Medicaid (MM social work unsuccessful telephone outreach )   A second unsuccessful telephone outreach was attempted today. The patient was referred to the case management team for assistance with care management and care coordination.   Follow Up Plan: A HIPAA compliant phone message was left for the patient providing contact information and requesting a return call.   Thersia Delene ROBINS, MHA [facility]  Managed [facility] Social Worker [phone]

## 2023-06-16 NOTE — ED Triage Notes (Signed)
 Patient to ED via POV for right hip pain- ongoing for years per patient. PT states pain worse today and unable to sleep due to pain. States pain radiates into groin.

## 2023-06-16 NOTE — ED Notes (Signed)
 Patient is able to weight-bear and ambulate in the room with a cane. Patient did require assistance to bring right leg up on the stretcher.

## 2023-06-20 ENCOUNTER — Ambulatory Visit: Payer: Medicaid Other

## 2023-06-21 ENCOUNTER — Other Ambulatory Visit: Payer: Self-pay | Admitting: *Deleted

## 2023-06-21 ENCOUNTER — Encounter: Payer: Medicaid Other | Admitting: Physical Medicine and Rehabilitation

## 2023-06-21 NOTE — Patient Outreach (Signed)
 Medicaid Managed Care   Nurse Care Manager Note  06/21/2023 Name:  Chris [name] MRN:  [id number] DOB:  Jul 17, 1960  Chris [name] is an 63 y.o. year old male who is [name] primary patient of Chris [name].  The [facility] Managed Care Coordination team was consulted for assistance with:    CHF Pain  Chris [name] was given information about Medicaid Managed Care Coordination team services today. Chris [name] Chris Patient agreed to services and verbal consent obtained.  Engaged with patient by telephone for follow up visit in response to provider referral for case management and/or care coordination services.   Patient is participating in [name] Managed Medicaid Plan:  Yes  Assessments/Interventions:  Review of past medical history, allergies, medications, health status, including review of consultants reports, laboratory and other test data, was performed as part of comprehensive evaluation and provision of chronic care management services.  SDOH (Social Drivers of Health) assessments and interventions performed: SDOH Interventions    Flowsheet Row Patient Outreach Telephone from 06/13/2023 in [place] HE[facility] POPULATION HEALTH DEPARTMENT Patient Outreach Telephone from 05/19/2023 in [place] POPULATION HEALTH DEPARTMENT Patient Outreach Telephone from 03/30/2023 in [place] POPULATION HEALTH DEPARTMENT Patient Outreach Telephone from 03/09/2023 in [place] POPULATION HEALTH DEPARTMENT Patient Outreach Telephone from 03/04/2023 in [place] POPULATION HEALTH DEPARTMENT Patient Outreach Telephone from 01/17/2023 in [place] POPULATION HEALTH DEPARTMENT  SDOH Interventions        Food Insecurity Interventions -- -- -- Intervention Not Indicated -- --  Housing Interventions -- -- -- Intervention Not Indicated -- --  Transportation Interventions -- Payor Benefit -- -- -- --  Utilities Interventions -- Other (Comment)  [[name] referral for utilities] -- -- -- --  Financial Strain Interventions --  -- -- -- Other (Comment)  [[facility] [name] involved and has [name] F/U appt] --  Stress Interventions Community Resources Provided -- [employer], Provide Counseling  [Pt reports his BH meds continues to help with anxiety and sleep but he is struggling with pain in his hip at this time which led to [name] recent ED visit] -- [employer], Provide Counseling Offered [employer] Resources, Provide Counseling       Care Plan  No Known Allergies  Medications Reviewed Today     Reviewed by Chris Andrea LABOR, RN (Registered Nurse) on 06/21/23 at 1322  Med List Status: <None>   Medication Order Taking? Sig Documenting Provider Last Dose Status Informant  Accu-Chek Softclix Lancets lancets [id number] Yes Use as instructed Chris [name] Taking Active Self  acetaminophen  (TYLENOL ) 500 MG tablet [id number] Yes Take 1-2 tablets (500-1,000 mg total) by mouth every 6 (six) hours as needed (pain.). Chris [name] Taking Active   albuterol  (PROVENTIL ) (2.5 MG/3ML) 0.083% nebulizer solution [id number] Yes Take 3 mLs (2.5 mg total) by nebulization every 4 (four) hours as needed for wheezing or shortness of breath. Chris [name] Taking Active   albuterol  (VENTOLIN  HFA) 108 [phone] Base) MCG/ACT inhaler [id number] Yes Inhale 2 puffs into the lungs every 6 (six) hours as needed for wheezing or shortness of breath. Chris [name] Taking Active   allopurinol  (ZYLOPRIM ) 100 MG tablet [id number] Yes TAKE 2 TABLETS (200 MG TOTAL) BY MOUTH DAILY. Chris [name] Taking Active            Med Note [name], Torrin Frein [name]   Fri Apr 08, 2023 11:32 AM)    apixaban  (ELIQUIS ) 5 MG TABS tablet [id number] Yes  Take 1 tablet (5 mg total) by mouth 2 (two) times daily. Chris [name] Taking Active   atorvastatin  (LIPITOR) 80 MG tablet [id number] Yes Take 1 tablet (80 mg total) by mouth daily. Chris [name] Taking Active   baclofen  (LIORESAL ) 10 MG tablet  [id number] No Take 1 tablet (10 mg total) by mouth 3 (three) times daily.  Patient not taking: Reported on 06/21/2023   Chris [name] Not Taking Active   Blood Glucose Monitoring Suppl (ACCU-CHEK AVIVA PLUS) w/Device KIT [id number] Yes 1 each by Does not apply route 3 (three) times daily. Chris [name] Taking Active   carvedilol  (COREG ) 12.5 MG tablet [id number] Yes Take 1 tablet (12.5 mg total) by mouth 2 (two) times daily. Chris [name] Taking Active   colchicine  0.6 MG tablet [id number] Yes SMARTSIG:0.5 Tablet(s) By Mouth 3 Times [name] Week Provider, Historical, [name] Taking Active   dapagliflozin  propanediol (FARXIGA ) 10 MG TABS tablet [id number] Yes Take 1 tablet (10 mg total) by mouth daily. Chris [name] Taking Active   doxycycline  (VIBRAMYCIN ) 100 MG capsule [id number] No Take 1 capsule (100 mg total) by mouth 2 (two) times daily. One po bid x 7 days  Patient not taking: Reported on 06/21/2023   Chris [name] Not Taking Active            Med Note (Chris [name]   Tue Jun 21, 2023  1:19 PM) completed  fluticasone -salmeterol (ADVAIR HFA) 230-21 MCG/ACT inhaler [id number] Yes Inhale 2 puffs into the lungs 2 (two) times daily. Chris [name] Taking Active   hydrOXYzine  (ATARAX ) 25 MG tablet [id number] Yes Take 1 tablet 3 times [name] day as needed for anxiety. Take 2 tablets at night as need for sleep Chris [name] Taking Active   isosorbide -hydrALAZINE  (BIDIL ) 20-37.5 MG tablet [id number] No Take 1 tablet by mouth 3 (three) times daily.  Patient not taking: Reported on 06/21/2023   Chris [name] Not Taking Active Self  lidocaine  (LIDODERM ) 5 % [id number]  Place 1 patch onto the skin daily. Remove & Discard patch within 12 hours or as directed by [name]  Patient not taking: Reported on 04/08/2023   Chris [name]  Active   meloxicam  (MOBIC ) 15 MG tablet [id number] Yes Take 1 tablet (15 mg total) by mouth daily. Chris [name] Taking Active     Discontinued  07/11/20 [phone] (Discontinued by provider)   mirtazapine  (REMERON ) 15 MG tablet [id number] Yes Take 1 tablet (15 mg total) by mouth at bedtime. Chris [name] Taking Active   oxyCODONE -acetaminophen  (PERCOCET/ROXICET) 5-325 MG tablet [id number] No Take 1 tablet by mouth every 4 (four) hours as needed for severe pain (pain score 7-10).  Patient not taking: Reported on 06/21/2023   Williams, Megan E, NP Not Taking Active   potassium chloride  (KLOR-CON  M) 10 MEQ tablet [id number] Yes Take 2 tablets (20 mEq total) by mouth daily. Chris [name] Taking Active   potassium chloride  (KLOR-CON ) 10 MEQ tablet [id number] Yes Take 20 mEq by mouth daily. Provider, Historical, [name] Taking Active   Tiotropium Bromide  Monohydrate (SPIRIVA  RESPIMAT) 2.5 MCG/ACT AERS [id number] Yes Inhale 2 puffs into the lungs daily. Chris [name] Taking Active   torsemide  (DEMADEX ) 20 MG tablet [id number] Yes Take 20 mg by mouth. Provider, Historical, [name] Taking Active   torsemide  40 MG TABS [id number] Yes Take 20 mg daily, alternating with 40 mg daily. Chris [name] Taking Active   traZODone  (  DESYREL ) 50 MG tablet [id number] Yes Take 2 tablets (100 mg total) by mouth at bedtime. Chris [name] Taking Active             Patient Active Problem List   Diagnosis Date Noted   Post traumatic stress disorder 09/23/2022   MDD (major depressive disorder), recurrent episode, moderate (HCC) 08/26/2022   Stage 3b chronic kidney disease (HCC) 08/09/2022   Hypertrophic cardiomyopathy (HCC) 08/09/2022   History of renal cell cancer 08/09/2022   Chronic heart failure with preserved ejection fraction (HFpEF) (HCC) 07/13/2022   Agitation 04/23/2022   RSV (respiratory syncytial virus pneumonia) 04/22/2022   Obesity (BMI 30-39.9) 04/21/2022   COPD exacerbation (HCC) 04/19/2022   Dyslipidemia 04/19/2022   Gout 04/19/2022   Right kidney mass 05/14/2021   CHF exacerbation (HCC) 04/14/2021   Chest pain 04/14/2021   Syncope  04/14/2021   Left-sided weakness 10/28/2020   Typical atrial flutter (HCC)    CHF (congestive heart failure) (HCC) 07/04/2020   Acute exacerbation of CHF (congestive heart failure) (HCC) 06/16/2020   Influenza vaccine refused 05/06/2020   Acute decompensated heart failure (HCC) 05/04/2020   Illiteracy 05/04/2020   Type 2 diabetes mellitus with stage 3 chronic kidney disease (HCC) 12/25/2019   Elevated troponin I level 10/26/2019   History of gout 02/01/2019   Seasonal allergic rhinitis due to pollen 02/01/2019   Nicotine  use disorder 11/30/2018   Microscopic hematuria 11/30/2018   Depression 11/30/2018   Difficulty controlling anger 11/30/2018   COPD (chronic obstructive pulmonary disease) (HCC)    CKD (chronic kidney disease) stage 3, GFR 30-59 ml/min (HCC) 08/10/2018   Recurrent epistaxis 04/21/2018   Mixed hyperlipidemia 07/28/2017   Essential hypertension 07/28/2017   Chronic systolic heart failure (HCC) 10/25/2014   Cocaine abuse (HCC) 02/20/2013   Cannabis abuse 02/20/2013   Back pain, chronic 02/20/2013    Conditions to be addressed/monitored per PCP order:  CHF and Pain  Care Plan : RN Care Manager Plan of Care  Updates made by Chris Andrea LABOR, RN since 06/21/2023 12:00 AM     Problem: Health Management Needs Related to Heart Failure and Chronic Pain   Priority: High     Long-Range Goal: Independent self health management of Heart Failure and Chronic Pain   Start Date: 07/23/2022  Expected End Date: 06/14/2023  Recent Progress: On track  Priority: High  Note:   Current Barriers:  Knowledge Deficits related to plan of care for management of CHF and Chronic Pain Care Coordination needs related to Transportation  Non-adherence to prescribed medication regimen Difficulty obtaining medications  RNCM Clinical Goal(s):  Patient will take all medications exactly as prescribed and will call provider for medication related questions as evidenced by medication review and  patient interview demonstrate Ongoing adherence to prescribed treatment plan for CHF as evidenced by medication review continue to work with RN Care Manager to address care management and care coordination needs related to  CHF as evidenced by adherence to CM Team Scheduled appointments work with child psychotherapist to address  related to the management of Limited social support, Transportation, and Lack of essential utilities - light/power* related to the management of CHF as evidenced by review of EMR and patient or child psychotherapist report through collaboration with Medical Illustrator, provider, and care team.   Interventions: Inter-disciplinary care team collaboration (see longitudinal plan of care) Evaluation of current treatment plan related to  self management and patient's adherence to plan as established by provider Reviewed upcoming appointments including:  06/22/23 with Dr. Rolan, 06/22/23 with Nephrology, 06/27/23 with Dr. Nancey and 07/26/23 with PCP Advised patient to take all medications with him to upcoming provider appointments Ensured patient will arrange transportation to upcoming appointments Rescheduled missed appointment with [name]    COPD Interventions:  (Status:  Condition stable.  Not addressed this visit.) Long Term Goal Advised patient to track and manage COPD triggers Advised patient to engage in light exercise as tolerated 3-5 days [name] week to aid in the the management of COPD Provided education about and advised patient to utilize infection prevention strategies to reduce risk of respiratory infection Discussed the importance of adequate rest and management of fatigue with COPD Assessed social determinant of health barriers Advised patient on smoking cessation, provided education Congratulated patient on cutting back on smoking, plan to work on smoking cessation goal during next visit-down to 1.5 pk/week Discussed setting [name] quit date, encouraged patient to work on cutting back   Pain  Interventions:  (Status:  Goal on track) Long Term Goal-right hip pain 8/10, back pain 0/10 Pain assessment performed Medications reviewed: advised patient to request Lidoderm  patch from [facility] Reviewed provider established plan for pain management Discussed importance of adherence to all scheduled medical appointments Counseled on the importance of reporting any/all new or changed pain symptoms or management strategies to pain management provider Reviewed with patient prescribed pharmacological and nonpharmacological pain relief strategies Reviewed provider notes and discussed  Patient Goals/Self-Care Activities: Take all medications as prescribed Attend all scheduled provider appointments Call provider office for new concerns or questions  Work with the social worker to address care coordination needs and will continue to work with the clinical team to address health care and disease management related needs call 1-800-273-TALK (toll free, 24 hour hotline) if experiencing [name] Mental Health or Behavioral Health Crisis   Follow Up Plan:  Telephone follow up appointment with care management team member scheduled for:  07/21/23 at 1:15pm     Follow Up:  Patient agrees to Care Plan and Follow-up.  Plan: The Managed Medicaid care management team will reach out to the patient again over the next 30 days.  Date/time of next scheduled RN care management/care coordination outreach:  07/21/23 at 1:15pm  Andrea Dimes RN, BSN [place]  Value-Based Care [facility] RN Care Coordinator [phone]

## 2023-06-21 NOTE — Patient Instructions (Signed)
 Visit Information  Chris [name] was given information about Medicaid Managed Care team care coordination services as a part of their Healthy [facility] Medicaid benefit. Chris [name] verbally consented to engagement with the [facility] Managed Care team.   If you are experiencing a medical emergency, please call 911 or report to your local emergency department or urgent care.   If you have a non-emergency medical problem during routine business hours, please contact your provider's office and ask to speak with a nurse.   For questions related to your Healthy [facility] health plan, please call: [phone] or visit the homepage here: [email or web address]  If you would like to schedule transportation through your Healthy [facility] plan, please call the following number at least 2 days in advance of your appointment: [phone]  For information about your ride after you set it up, call Ride Assist at [phone]. Use this number to activate a Will Call pickup, or if your transportation is late for a scheduled pickup. Use this number, too, if you need to make a change or cancel a previously scheduled reservation.  If you need transportation services right away, call [phone]. The after-hours call center is staffed 24 hours to handle ride assistance and urgent reservation requests (including discharges) 365 days a year. Urgent trips include sick visits, hospital discharge requests and life-sustaining treatment.  Call the [facility] Line at [phone], at any time, 24 hours a day, 7 days a week. If you are in danger or need immediate medical attention call 911.  If you would like help to quit smoking, call 1-800-QUIT-NOW (256-132-5199) OR Espaol: 1-855-Djelo-Ya (8-144-664-6430) o para ms informacin haga clic aqu or Text READY to 799-599 to register via text  Chris [name],    Please see education materials related to CHF provided as  print materials.   The patient verbalized understanding of instructions, educational materials, and care plan provided today and agreed to receive a mailed copy of patient instructions, educational materials, and care plan.   Telephone follow up appointment with Managed Medicaid care management team member scheduled for:07/21/23 at 1:15pm  Andrea Dimes RN, BSN [place]  Value-Based Care [facility] RN Care Coordinator [phone]   Following is a copy of your plan of care:  Care Plan : RN Care Manager Plan of Care  Updates made by Dimes Andrea LABOR, RN since 06/21/2023 12:00 AM     Problem: Health Management Needs Related to Heart Failure and Chronic Pain   Priority: High     Long-Range Goal: Independent self health management of Heart Failure and Chronic Pain   Start Date: 07/23/2022  Expected End Date: 06/14/2023  Recent Progress: On track  Priority: High  Note:   Current Barriers:  Knowledge Deficits related to plan of care for management of CHF and Chronic Pain Care Coordination needs related to Transportation  Non-adherence to prescribed medication regimen Difficulty obtaining medications  RNCM Clinical Goal(s):  Patient will take all medications exactly as prescribed and will call provider for medication related questions as evidenced by medication review and patient interview demonstrate Ongoing adherence to prescribed treatment plan for CHF as evidenced by medication review continue to work with RN Care Manager to address care management and care coordination needs related to  CHF as evidenced by adherence to CM Team Scheduled appointments work with social worker to address  related to the management of Limited social support, Transportation, and Lack of essential utilities - light/power* related to the management of CHF as evidenced  by review of EMR and patient or social worker report through collaboration with Medical Illustrator, provider, and care team.    Interventions: Inter-disciplinary care team collaboration (see longitudinal plan of care) Evaluation of current treatment plan related to  self management and patient's adherence to plan as established by provider Reviewed upcoming appointments including:  06/22/23 with Dr. Rolan, 06/22/23 with Nephrology, 06/27/23 with Dr. Nancey and 07/26/23 with PCP Advised patient to take all medications with him to upcoming provider appointments Ensured patient will arrange transportation to upcoming appointments Rescheduled missed appointment with [name]    COPD Interventions:  (Status:  Condition stable.  Not addressed this visit.) Long Term Goal Advised patient to track and manage COPD triggers Advised patient to engage in light exercise as tolerated 3-5 days a week to aid in the the management of COPD Provided education about and advised patient to utilize infection prevention strategies to reduce risk of respiratory infection Discussed the importance of adequate rest and management of fatigue with COPD Assessed social determinant of health barriers Advised patient on smoking cessation, provided education Congratulated patient on cutting back on smoking, plan to work on smoking cessation goal during next visit-down to 1.5 pk/week Discussed setting a quit date, encouraged patient to work on cutting back   Pain Interventions:  (Status:  Goal on track) Long Term Goal-right hip pain 8/10, back pain 0/10 Pain assessment performed Medications reviewed: advised patient to request Lidoderm  patch from [facility] Reviewed provider established plan for pain management Discussed importance of adherence to all scheduled medical appointments Counseled on the importance of reporting any/all new or changed pain symptoms or management strategies to pain management provider Reviewed with patient prescribed pharmacological and nonpharmacological pain relief strategies Reviewed provider notes and  discussed  Patient Goals/Self-Care Activities: Take all medications as prescribed Attend all scheduled provider appointments Call provider office for new concerns or questions  Work with the social worker to address care coordination needs and will continue to work with the clinical team to address health care and disease management related needs call 1-800-273-TALK (toll free, 24 hour hotline) if experiencing a Mental Health or Behavioral Health Crisis   Follow Up Plan:  Telephone follow up appointment with care management team member scheduled for:  07/21/23 at 1:15pm

## 2023-06-22 ENCOUNTER — Encounter: Payer: Medicaid Other | Admitting: Cardiology

## 2023-06-24 ENCOUNTER — Ambulatory Visit: Payer: Medicaid Other | Attending: Cardiology | Admitting: Cardiology

## 2023-06-24 VITALS — BP 146/96 | HR 67

## 2023-06-24 DIAGNOSIS — I5042 Chronic combined systolic (congestive) and diastolic (congestive) heart failure: Secondary | ICD-10-CM

## 2023-06-24 MED ORDER — ISOSORB DINITRATE-HYDRALAZINE 20-37.5 MG PO TABS: 1.0000 | ORAL_TABLET | Freq: Three times a day (TID) | ORAL | 3 refills | Status: DC

## 2023-06-24 NOTE — Patient Instructions (Signed)
 RESTART Bidil  1 tab Three times a day  Go DOWN to LOWER LEVEL (LL) to have your blood work   We will only call you if the results are abnormal or if the provider would like to make medication changes.  Your physician has requested that you have an echocardiogram. Echocardiography is a painless test that uses sound waves to create images of your heart. It provides your doctor with information about the size and shape of your heart and how well your heart's chambers and valves are working. This procedure takes approximately one hour. There are no restrictions for this procedure. Please do NOT wear cologne, perfume, aftershave, or lotions (deodorant is allowed). Please arrive 15 minutes prior to your appointment time.  Please note: We ask at that you not bring children with you during ultrasound (echo/ vascular) testing. Due to room size and safety concerns, children are not allowed in the ultrasound rooms during exams. Our front office staff cannot provide observation of children in our lobby area while testing is being conducted. An adult accompanying a patient to their appointment will only be allowed in the ultrasound room at the discretion of the ultrasound technician under special circumstances. We apologize for any inconvenience.  Your physician recommends that you schedule a follow-up appointment in: 3 months with an echocardiogram ( April) ** PLEASE CALL THE OFFICE IN MID FEBRUARY TO ARRANGE YOUR FOLLOW UP APPOINTMENT. **  At the [facility], you and your health needs are our priority. As part of our continuing mission to provide you with exceptional heart care, we have created designated Provider Care Teams. These Care Teams include your primary Cardiologist (physician) and Advanced Practice Providers (APPs- Physician Assistants and Nurse Practitioners) who all work together to provide you with the care you need, when you need it.   You may see any of the following providers  on your designated Care Team at your next follow up: Dr Toribio Fuel Dr Ezra Shuck Dr. Ria Commander Dr. Morene Brownie Amy Lenetta, NP Caffie Shed, [place] [facility] [place], [place] Beckey Coe, NP Jordan Lee, NP Ellouise Class, NP Jaun Bash, PharmD   Please be sure to bring in all your medications bottles to every appointment.    Thank you for choosing [facility]

## 2023-06-26 NOTE — Progress Notes (Signed)
 PCP: Vicci Barnie NOVAK, MD HF Cardiology: Dr. Rolan  63 y.o. with history of HFpEF, CKD stage 3, possible hypertrophic cardiomyopathy, renal cell CA s/p ablation, and atrial flutter s/p 1/22 DCCV who was referred to CHF MD clinic by [facility].  Patient was admitted in 1/22 with CHF and found to be in atrial flutter.  Echo showed EF 25% and he was cardioverted to NSR.  He is no longer anticoagulated. Cardiolite in 2/23 showed no ischemia, fixed inferior defect.  Echo in 8/23 showed EF 50% with severe asymmetric septal hypertrophy but no SAM or LVOT gradient.  Cardiac MRI in 12/23 was similar with LV EF 50%, severe asymmetric septal hypertrophy, normal RV, LGE in the basal septum. These studies were concerning for hypertrophic cardiomyopathy.  Patient is an active smoker and used to use cocaine.  He carries history of COPD.  Grandfather had sudden cardiac death at work (fell out dead).   RHC in 08-04-23 showed elevated filling pressures, CI 2.25, PVR 3.9 WU.  Torsemide  was increased.    Patient was seen in the ER in 2/6 with abdominal pain, nausea/vomiting.  At that time, creatinine was up to 2.54.   Zio monitor done 2/24 for episodes of ?syncope showed no AF/AFL, 1.5 % PVCs, 6 short NSVT runs (longest was 7 beats).   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  PKP2 gene is associated with ARVC and Brugada, but the clinical significance of the mutation found in this patient is uncertain (not reported in literature).  Patient was seen by Dr. Fairy for genetics counseling, she did not think that these mutations were likely to be malignant.   Echo in 5/24 showed EF 45-50%, moderate asymmetric septal hypertrophy with no LVOT gradient of mitral valve SAM, RV normal, mild-moderate MR, IVC normal.   LHC/RHC was done  in 6/24, showing 65% proximal RCA stenosis, normal filling pressures but low CI at 1.81.   Patient was seen in the ER in 7/24 due to severe low back pain.  UDS was positive for cocaine.  He says this is the first time in years he has used it, due to stress from court case and back pain.   He saw Dr. Nancey with EP, ICD placement was discussed for HCM, he was a borderline candidate and ended up deciding that he did not want ICD.   He returns today for followup of CHF and suspected HCM.  Still smoking 3-4 cigarettes/day.  BP is elevated, he is not taking Bidil  and is not sure why not.  Weight is up about 7 lbs.  Breathing is stable, mild dyspnea walking 20-30 yards.  Limited the most by back and hip pain.  No chest pain.  No lightheadedness or syncope.  No palpitations.    ECG (personally reviewed): NSR, PVCs, nonspecific anterolateral TW changes.   Labs 2023-08-04): BNP 873, K 3.6, creatinine 1.7 Labs (2/24): K 4, creatinine 2.54 Labs (4/24): K 4.2, creatinine 1.75, BNP 280, hgb 14.4 Labs (7/24): K 4.1, creatinine 2.15 => 2.73, BNP 1107, LDL 125 Labs (9/24): LDL 53 Labs (12/24): K 5.2, creatinine 2.33  PMH: 1. Renal cell carcinoma on right: s/p ablation 08/04/2023.  2. HF with mid range EF: ?HCM, ?cardiomyopathy related to cocaine in the past.  EF low in the past, echo in 1/22  with EF 22%.   - Cardiolite (2/23): Fixed inferior defect, ?artifact.  - Echo (8/23): EF 50%, severe asymmetric septal hypertrophy, no SAM/MR, no LVOT gradient, RV normal.  - Cardiac MRI (12/23): LV EF 50%, severe asymmetric septal hypertrophy, normal RV, LGE in the basal septum.  Possible hypertrophic cardiomyopathy.  - RHC (1/24): mean RA 15, PA 50/31 mean 42, mean PCWP 21, CI 2.25, PVR 3.9 WU, PAPi 1.3 - Zio monitor done 2/24 showed no AF/AFL, 1.5 % PVCs, 6 short NSVT runs (longest was 7 beats).  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 MYBPC3 gene is associated with HCM but  the clinical significance of the mutation found in this patient is uncertain (not reported in literature).  PKP2 gene is associated with ARVC and Brugada, but the clinical significance of the mutation found in this patient is uncertain (not reported in literature).  Seen by Dr. Fairy, mutations not thought to be clinically significant.  - Echo (5/24): EF 45-50%, moderate asymmetric septal hypertrophy with no LVOT gradient of mitral valve SAM, RV normal, mild-moderate MR, IVC normal. - [facility] (6/24): 65% proximal RCA stenosis; mean RA 5, PA 32/12 mean 24, mean PCWP 7,CI 1.81. 3. Atrial flutter: DCCV 1/22.   4. Type 2 diabetes.  5. HTN 6. CKD stage 3 7. Gout 8. Depression 9. COPD: Active smoker.  10. Prior cocaine abuse.   Social History   Socioeconomic History   Marital status: Divorced    Spouse name: Not on file   Number of children: 3   Years of education: Not on file   Highest education level: High school graduate  Occupational History   Occupation: disability  Tobacco Use   Smoking status: Every Day    Current packs/day: 1.00    Average packs/day: 1 pack/day for 43.0 years (43.0 ttl pk-yrs)    Types: Cigarettes   Smokeless tobacco: Never   Tobacco comments:        6 cigs daily--03/28/2023  Vaping Use   Vaping status: Never Used  Substance and Sexual Activity   Alcohol use: Yes    Alcohol/week: 4.0 standard drinks of alcohol    Types: 4 Shots of liquor per week   Drug use: Yes    Frequency: 21.0 times per week    Types: Marijuana    Comment: last use Cocaine- 03/28/2021. Still using marijuana, last use 06/22/21   Sexual activity: Not on file  Other Topics Concern   Not on file  Social History Narrative   ** Merged History Encounter **       Social Drivers of Health   Financial Resource Strain: Medium Risk (03/04/2023)   Overall Financial Resource Strain (CARDIA)    Difficulty of Paying Living Expenses: Somewhat hard  Food Insecurity: No Food Insecurity  (03/09/2023)   Hunger Vital Sign    Worried About Running Out of Food in the Last Year: Never true    Ran Out of Food in the Last Year: Never true  Transportation Needs: Unmet Transportation Needs (05/19/2023)   PRAPARE - Administrator, Civil Service (Medical): Yes    Lack of Transportation (Non-Medical): Yes  Physical Activity: Insufficiently Active (11/16/2021)   Exercise Vital Sign    Days of Exercise per Week: 5 days    Minutes of Exercise per Session: 20 min  Stress: Stress Concern Present (06/13/2023)   [employer] of Occupational Health - Occupational Stress Questionnaire  Feeling of Stress : Very much  Social Connections: Moderately Isolated (07/23/2022)   Social Connection and Isolation Panel [NHANES]    Frequency of Communication with Friends and Family: Twice a week    Frequency of Social Gatherings with Friends and Family: Once a week    Attends Religious Services: More than 4 times per year    Active Member of [employer] or Organizations: No    Attends Banker Meetings: Never    Marital Status: Divorced  Catering Manager Violence: Not At Risk (04/20/2022)   Humiliation, Afraid, Rape, and Kick questionnaire    Fear of Current or Ex-Partner: No    Emotionally Abused: No    Physically Abused: No    Sexually Abused: No   Family History  Problem Relation Age of Onset   Heart disease Father    Diabetes Mother    HIV Brother    Healthy Son    Healthy Daughter    ROS: All systems reviewed and negative except as per HPI.   Current Outpatient Medications  Medication Sig Dispense Refill   Accu-Chek Softclix Lancets lancets Use as instructed 100 each 12   acetaminophen  (TYLENOL ) 500 MG tablet Take 1-2 tablets (500-1,000 mg total) by mouth every 6 (six) hours as needed (pain.). 30 tablet 0   albuterol  (PROVENTIL ) (2.5 MG/3ML) 0.083% nebulizer solution Take 3 mLs (2.5 mg total) by nebulization every 4 (four) hours as needed for wheezing or shortness of  breath. 300 mL 2   albuterol  (VENTOLIN  HFA) 108 (90 Base) MCG/ACT inhaler Inhale 2 puffs into the lungs every 6 (six) hours as needed for wheezing or shortness of breath. 54 g 6   allopurinol  (ZYLOPRIM ) 100 MG tablet TAKE 2 TABLETS (200 MG TOTAL) BY MOUTH DAILY. 60 tablet 2   apixaban  (ELIQUIS ) 5 MG TABS tablet Take 1 tablet (5 mg total) by mouth 2 (two) times daily. 60 tablet 11   atorvastatin  (LIPITOR) 80 MG tablet Take 1 tablet (80 mg total) by mouth daily. 90 tablet 1   baclofen  (LIORESAL ) 10 MG tablet Take 1 tablet (10 mg total) by mouth 3 (three) times daily. (Patient not taking: Reported on 06/21/2023) 30 each 0   Blood Glucose Monitoring Suppl (ACCU-CHEK AVIVA PLUS) w/Device KIT 1 each by Does not apply route 3 (three) times daily. 1 kit 0   carvedilol  (COREG ) 12.5 MG tablet Take 1 tablet (12.5 mg total) by mouth 2 (two) times daily. 180 tablet 3   colchicine  0.6 MG tablet SMARTSIG:0.5 Tablet(s) By Mouth 3 Times a Week     dapagliflozin  propanediol (FARXIGA ) 10 MG TABS tablet Take 1 tablet (10 mg total) by mouth daily. 90 tablet 3   doxycycline  (VIBRAMYCIN ) 100 MG capsule Take 1 capsule (100 mg total) by mouth 2 (two) times daily. One po bid x 7 days (Patient not taking: Reported on 06/21/2023) 14 capsule 0   fluticasone -salmeterol (ADVAIR HFA) 230-21 MCG/ACT inhaler Inhale 2 puffs into the lungs 2 (two) times daily. 1 each 12   hydrOXYzine  (ATARAX ) 25 MG tablet Take 1 tablet 3 times a day as needed for anxiety. Take 2 tablets at night as need for sleep 270 tablet 0   isosorbide -hydrALAZINE  (BIDIL ) 20-37.5 MG tablet Take 1 tablet by mouth 3 (three) times daily. 270 tablet 3   lidocaine  (LIDODERM ) 5 % Place 1 patch onto the skin daily. Remove & Discard patch within 12 hours or as directed by MD (Patient not taking: Reported on 04/08/2023) 30 patch 0   meloxicam  (  MOBIC ) 15 MG tablet Take 1 tablet (15 mg total) by mouth daily. 30 tablet 0   mirtazapine  (REMERON ) 15 MG tablet Take 1 tablet (15 mg  total) by mouth at bedtime. 90 tablet 0   oxyCODONE -acetaminophen  (PERCOCET/ROXICET) 5-325 MG tablet Take 1 tablet by mouth every 4 (four) hours as needed for severe pain (pain score 7-10). (Patient not taking: Reported on 06/21/2023) 20 tablet 0   potassium chloride  (KLOR-CON  M) 10 MEQ tablet Take 2 tablets (20 mEq total) by mouth daily. 180 tablet 3   potassium chloride  (KLOR-CON ) 10 MEQ tablet Take 20 mEq by mouth daily.     Tiotropium Bromide  Monohydrate (SPIRIVA  RESPIMAT) 2.5 MCG/ACT AERS Inhale 2 puffs into the lungs daily. 4 g 6   torsemide  (DEMADEX ) 20 MG tablet Take 20 mg by mouth.     torsemide  40 MG TABS Take 20 mg daily, alternating with 40 mg daily. 30 tablet 0   traZODone  (DESYREL ) 50 MG tablet Take 2 tablets (100 mg total) by mouth at bedtime. 180 tablet 0   No current facility-administered medications for this visit.   BP (!) 146/96   Pulse 67   SpO2 100%  General: NAD Neck: No JVD, no thyromegaly or thyroid  nodule.  Lungs: Clear to auscultation bilaterally with normal respiratory effort. CV: Nondisplaced PMI.  Heart regular S1/S2, no S3/S4, no murmur.  No peripheral edema.  No carotid bruit.  Normal pedal pulses.  Abdomen: Soft, nontender, no hepatosplenomegaly, no distention.  Skin: Intact without lesions or rashes.  Neurologic: Alert and oriented x 3.  Psych: Normal affect. Extremities: No clubbing or cyanosis.  HEENT: Normal.   1. Chronic diastolic CHF/?hypertrophic cardiomyopathy: Prior history of HFrEF that may have been tachycardia-mediated in setting of atrial flutter in 1/22 vs cocaine-related, now EF has recovered to 50% on last study. Cardiolite in 2/23 showed no ischemia, fixed inferior defect.  Echo in 8/23 showed EF 50% with severe asymmetric septal hypertrophy but no SAM or LVOT gradient.  Cardiac MRI in 12/23 was similar with LV EF 50%, severe asymmetric septal hypertrophy, normal RV, LGE in the basal septum. These studies were concerning for hypertrophic  cardiomyopathy.  RHC in 1/24 showed elevated filling pressures with CI 2.25.  His grandfather may have had sudden cardiac death at work, father died at 69 from heart disease.  He has had syncopal episodes that may have been orthostatic, but I worried about ventricular arrhythmias given possible HCM and LGE noted on cardiac MRI. Zio monitor in 2/24 showed 1.5% PVCs and 6 short NSVT runs (longest 7 beats).  Nothing to explain syncope and has had no further episodes. 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  PKP2 gene is associated with ARVC and Brugada, but the clinical significance of the mutation found in this patient is uncertain (not reported in literature).  She was seen by Dr. Fairy, mutations not thought to be clinically significant.  Echo in 5/24 showed EF 45-50%, moderate asymmetric septal hypertrophy with no LVOT gradient of mitral valve SAM, RV normal, mild-moderate MR, IVC normal. Cath in 6/24 showed nonobstructive CAD, normal filling pressures, low CI. 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  NYHA class II.  Weight has increased but he is not volume overloaded on exam.  - He was seen by Dr. Nancey for EP,  thought to be borderline for ICD.  After discussion, he decided against placement.  - With no LVOT obstruction or significant murmur as well as increased BP, he needs to restart Bidil  at 1 tab tid.   - Continue Coreg  12.5 mg bid.  - I will continue 20 mg daily, BMET/BNP today.  - Continue dapagliflozin  10 mg daily.  - Repeat echo at followup in 3 months.  2. CKD stage 3: BMET today. Has referral to nephrology.   3. COPD: Patient is active smoker and carries this history.  I strongly encouraged him to quit smoking today.  He is smoking much less than before.   4. Atrial flutter: S/p DCCV in  1/22.  NSR today.  - Continue apixaban .  5. Cocaine abuse: UDS positive in 7/24.   Says this was a one-time event.  6. 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  - Continue atorvastatin , good lipids in 9/24.     Followup in 3 months with echo   Chris [name] 06/26/2023

## 2023-06-27 ENCOUNTER — Ambulatory Visit: Payer: Medicaid Other | Admitting: Cardiovascular Disease

## 2023-06-30 ENCOUNTER — Other Ambulatory Visit: Payer: Self-pay

## 2023-06-30 NOTE — Patient Instructions (Signed)
  Medicaid Managed Care   Unsuccessful Outreach Note  06/30/2023 Name: Chris [name] MRN: [id number] DOB: Oct 16, 1960  Referred by: [name], MD Reason for referral : High Risk Managed Medicaid (MM social work unsuccessful telephone outreach )   An unsuccessful telephone outreach was attempted today. The patient was referred to the case management team for assistance with care management and care coordination.   Follow Up Plan: A HIPAA compliant phone message was left for the patient providing contact information and requesting a return call.   Abelino Derrick, MHA [facility]  Managed [facility] Social Worker [phone]

## 2023-06-30 NOTE — Patient Outreach (Signed)
  Medicaid Managed Care   Unsuccessful Outreach Note  06/30/2023 Name: Chris [name] MRN: [id number] DOB: Jan 30, 1961  Referred by: [name], MD Reason for referral : High Risk Managed Medicaid (MM social work unsuccessful telephone outreach )   A second unsuccessful telephone outreach was attempted today. The patient was referred to the case management team for assistance with care management and care coordination.   Follow Up Plan: A HIPAA compliant phone message was left for the patient providing contact information and requesting a return call.   Abelino Derrick, MHA [facility]  Managed [facility] Social Worker [phone]

## 2023-07-01 ENCOUNTER — Other Ambulatory Visit: Payer: Self-pay | Admitting: Emergency Medicine

## 2023-07-01 ENCOUNTER — Telehealth: Payer: Self-pay | Admitting: Acute Care

## 2023-07-01 DIAGNOSIS — Z122 Encounter for screening for malignant neoplasm of respiratory organs: Secondary | ICD-10-CM

## 2023-07-01 DIAGNOSIS — Z87891 Personal history of nicotine dependence: Secondary | ICD-10-CM

## 2023-07-01 DIAGNOSIS — F17210 Nicotine dependence, cigarettes, uncomplicated: Secondary | ICD-10-CM

## 2023-07-01 NOTE — Telephone Encounter (Signed)
Lung Cancer Screening Narrative/Criteria Questionnaire (Cigarette Smokers Only- No Cigars/Pipes/vapes)   Chris [name]   SDMV:07/11/2023 at 9:30am with Dirk Dress NP        12/18/1960   LDCT: 07/15/2023 at 9:30am at [facility]    63 y.o.   Phone: [phone]  Lung Screening Narrative (confirm age 26-77 yrs Medicare / 50-80 yrs Private pay insurance)   Insurance information:Medicaid   Referring Provider:Dr. Aundria Rud   This screening involves an initial phone call with a team member from our program. It is called a shared decision making visit. The initial meeting is required by  insurance and Medicare to make sure you understand the program. This appointment takes about 15-20 minutes to complete. You will complete the screening scan at your scheduled date/time.  This scan takes about 5-10 minutes to complete. You can eat and drink normally before and after the scan.  Criteria questions for Lung Cancer Screening:   Are you a current or former smoker? Current Age began smoking: 63yo   If you are a former smoker, what year did you quit smoking? N/A(within 15 yrs)   To calculate your smoking history, I need an accurate estimate of how many packs of cigarettes you smoked per day and for how many years. (Not just the number of PPD you are now smoking)   Years smoking 42 x Packs per day 1 = Pack years 42   (at least 20 pack yrs)   (Make sure they understand that we need to know how much they have smoked in the past, not just the number of PPD they are smoking now)  Do you have a personal history of cancer?  Yes - (type and when diagnosed - 5 yrs cancer free) Renal cancer in 06/2022. Patient had an ablation with recent clear abd MRI in 02/2023 . Per Kandice Robinsons NP, ok to schedule.     Do you have a family history of cancer? No  Are you coughing up blood?  No  Have you had unexplained weight loss of 15 lbs or more in the last 6 months? No  It looks like you meet all criteria.  When would be a  good time for [place] to schedule you for this screening?   Additional information: N/A

## 2023-07-07 NOTE — Progress Notes (Deleted)
  Electrophysiology Office Note:   Date:  07/07/2023  ID:  Chris [name], Chris [name] January 10, 1961, MRN [id number]  Primary Cardiologist: Chris Odea, MD Electrophysiologist: Chris Small, MD  {Click to update primary MD,subspecialty MD or APP then REFRESH:1}    History of Present Illness:   Chris [name] is a 63 y.o. male with h/o atrial flutter, possible hypertrophic cardiomyopathy,HFpEF, renal cell carcinoma status post ablation seen today for routine electrophysiology followup.   Since last being seen in our clinic the patient reports doing ***.  he denies chest pain, palpitations, dyspnea, PND, orthopnea, nausea, vomiting, dizziness, syncope, edema, weight gain, or early satiety.   Review of systems complete and found to be negative unless listed in HPI.   EP Information / Studies Reviewed:    EKG is not ordered today. EKG from 06/24/2023 reviewed which showed NSR at 68 bpm with a PVC       Arrhythmia/Device History [facility] MD: Chris [name].   Echo 10/2022  LVEF 45-50%.  Moderate LV septal hypertrophy, grade 1 DD.    Zio monitor February 2024 -  Sinus rhythm heart rate 66 220 bpm, average 88 bpm 1.5% PVCs  There were 6 episodes of NSVT over the 14 day monitoring period, longest 7 beats, the fastest was 188 bpm There were 3 patient triggered events correlating with sinus rhythm and, on 1 occasion, a PVC.  No fibrillation or flutter detected   cMRI 05/26/2022 - poor study. .  There is asymmetric septal hypertrophy with a maximal septal wall thickness of 1.8 cm.  Mid wall LGE is present in the basal septum.  No LV outflow tract obstruction noted.  Findings consistent with HCM, asymmetric septal variant   Cardiac catherization   Coronary angiogram November 19, 2022 65% proximal RCA stenosis.  Low cardiac output.  Physical Exam:   VS:  There were no vitals taken for this visit.   Wt Readings from Last 3 Encounters:  06/16/23 270 lb (122.5 kg)  05/18/23 264 lb (119.7 kg)   03/28/23 272 lb 12.8 oz (123.7 kg)    GEN: No acute distress NECK: No JVD; No carotid bruits CARDIAC: {EPRHYTHM:28826}, no murmurs, rubs, gallops RESPIRATORY:  Clear to auscultation without rales, wheezing or rhonchi  ABDOMEN: Soft, non-tender, non-distended EXTREMITIES:  {EDEMA LEVEL:28147::"No"} edema; No deformity   ASSESSMENT AND PLAN:   ***  {Click here to Review PMH, Prob List, Meds, Allergies, SHx, FHx  :1}   Follow up with {WUJWJ:19147} {EPFOLLOW WG:95621}  Signed, Chris Freer, PA-C    provider.

## 2023-07-08 ENCOUNTER — Ambulatory Visit: Payer: Medicaid Other | Admitting: Student

## 2023-07-11 ENCOUNTER — Encounter: Payer: Self-pay | Admitting: Adult Health

## 2023-07-11 ENCOUNTER — Ambulatory Visit (INDEPENDENT_AMBULATORY_CARE_PROVIDER_SITE_OTHER): Payer: Medicaid Other | Admitting: Adult Health

## 2023-07-11 ENCOUNTER — Encounter: Payer: Self-pay | Admitting: Student

## 2023-07-11 DIAGNOSIS — F17210 Nicotine dependence, cigarettes, uncomplicated: Secondary | ICD-10-CM

## 2023-07-11 NOTE — Patient Instructions (Signed)

## 2023-07-11 NOTE — Progress Notes (Signed)
  Virtual Visit via Telephone Note  I connected with Chris [name] , 07/11/23 9:33 AM by a telemedicine application and verified that I am speaking with the correct person using two identifiers.  Location: Patient: home  Provider: home   I discussed the limitations of evaluation and management by telemedicine and the availability of in person appointments. The patient expressed understanding and agreed to proceed.   Shared Decision Making Visit Lung Cancer Screening Program [phone])   Eligibility: 63 y.o. Pack Years Smoking History Calculation =42 pack years  (# packs/per year x # years smoked) Recent History of coughing up blood  no Unexplained weight loss? no ( >Than 15 pounds within the last 6 months ) Prior History Lung / other cancer no (Diagnosis within the last 5 years already requiring surveillance chest CT Scans). Smoking Status Current Smoker   Visit Components: Discussion included one or more decision making aids. YES Discussion included risk/benefits of screening. YES Discussion included potential follow up diagnostic testing for abnormal scans. YES Discussion included meaning and risk of over diagnosis. YES Discussion included meaning and risk of False Positives. YES Discussion included meaning of total radiation exposure. YES  Counseling Included: Importance of adherence to annual lung cancer LDCT screening. YES Impact of comorbidities on ability to participate in the program. YES Ability and willingness to under diagnostic treatment. YES  Smoking Cessation Counseling: Current Smokers:  Discussed importance of smoking cessation. yes Information about tobacco cessation classes and interventions provided to patient. yes Patient provided with "ticket" for LDCT Scan. yes Symptomatic Patient. NO Diagnosis Code: Tobacco Use Z72.0 Asymptomatic Patient yes  Counseling (Intermediate counseling: > three minutes counseling) R4431  Z12.2-Screening of respiratory  organs Z87.891-Personal history of nicotine dependence   Danford Bad 07/11/23

## 2023-07-15 ENCOUNTER — Ambulatory Visit (HOSPITAL_BASED_OUTPATIENT_CLINIC_OR_DEPARTMENT_OTHER)
Admission: RE | Admit: 2023-07-15 | Discharge: 2023-07-15 | Disposition: A | Discharge status: Home / Self Care | Payer: Medicaid Other | Source: Ambulatory Visit | Attending: Acute Care | Admitting: Acute Care

## 2023-07-15 DIAGNOSIS — Z87891 Personal history of nicotine dependence: Secondary | ICD-10-CM | POA: Insufficient documentation

## 2023-07-15 DIAGNOSIS — F17210 Nicotine dependence, cigarettes, uncomplicated: Secondary | ICD-10-CM | POA: Diagnosis not present

## 2023-07-15 DIAGNOSIS — Z122 Encounter for screening for malignant neoplasm of respiratory organs: Secondary | ICD-10-CM | POA: Diagnosis not present

## 2023-07-21 ENCOUNTER — Other Ambulatory Visit: Payer: Self-pay | Admitting: *Deleted

## 2023-07-21 NOTE — Patient Outreach (Signed)
 Medicaid Managed Care   Nurse Care Manager Note  07/21/2023 Name:  Chris [name] MRN:  [id number] DOB:  1961-03-19  Chris [name] is an 63 y.o. year old male who is [name] primary patient of Chris Barnie NOVAK, [name].  The [facility] Managed Care Coordination team was consulted for assistance with:    COPD Pain  Chris [name] was given information about Medicaid Managed Care Coordination team services today. Chris [name] Chris Patient agreed to services and verbal consent obtained.  Engaged with patient by telephone for follow up visit in response to provider referral for case management and/or care coordination services.   Patient is participating in [name] Managed Medicaid Plan:  Yes  Assessments/Interventions:  Review of past medical history, allergies, medications, health status, including review of consultants reports, laboratory and other test data, was performed as part of comprehensive evaluation and provision of chronic care management services.  SDOH (Social Drivers of Health) assessments and interventions performed: SDOH Interventions    Flowsheet Row Patient Outreach Telephone from 07/21/2023 in [place] [facility] POP[facility] HEALTH DEPARTMENT Patient Outreach Telephone from 06/13/2023 in [place] POPULATION HEALTH DEPARTMENT Patient Outreach Telephone from 05/19/2023 in [place] POPULATION HEALTH DEPARTMENT Patient Outreach Telephone from 03/30/2023 in [place] POPULATION HEALTH DEPARTMENT Patient Outreach Telephone from 03/09/2023 in [place] POPULATION HEALTH DEPARTMENT Patient Outreach Telephone from 03/04/2023 in [place] POPULATION HEALTH DEPARTMENT  SDOH Interventions        Food Insecurity Interventions -- -- -- -- Intervention Not Indicated --  Housing Interventions Intervention Not Indicated -- -- -- Intervention Not Indicated --  Transportation Interventions Payor Benefit -- Payor Benefit -- -- --  Utilities Interventions -- -- Other (Comment)  [[name] referral for utilities] -- -- --   Financial Strain Interventions -- -- -- -- -- Other (Comment)  [[facility] [name] involved and has [name] F/U appt]  Stress Interventions -- [employer] Provided -- [employer], Provide Counseling  [Pt reports his BH meds continues to help with anxiety and sleep but he is struggling with pain in his hip at this time which led to [name] recent ED visit] -- [employer], Provide Counseling       Care Plan  No Known Allergies  Medications Reviewed Today     Reviewed by Chris Andrea LABOR, RN (Registered Nurse) on 07/21/23 at 1400  Med List Status: <None>   Medication Order Taking? Sig Documenting Provider Last Dose Status Informant  Accu-Chek Softclix Lancets lancets [id number]  Use as instructed Chris Barnie NOVAK, [name]  Active Self  acetaminophen  (TYLENOL ) 500 MG tablet [id number] Yes Take 1-2 tablets (500-1,000 mg total) by mouth every 6 (six) hours as needed (pain.). Chris [name] Taking Active   albuterol  (PROVENTIL ) (2.5 MG/3ML) 0.083% nebulizer solution [id number]  Take 3 mLs (2.5 mg total) by nebulization every 4 (four) hours as needed for wheezing or shortness of breath. Chris [name]  Expired 06/21/23 2359   albuterol  (VENTOLIN  HFA) 108 (90 Base) MCG/ACT inhaler [id number] Yes Inhale 2 puffs into the lungs every 6 (six) hours as needed for wheezing or shortness of breath. Chris Barnie NOVAK, [name] Taking Active   allopurinol  (ZYLOPRIM ) 100 MG tablet [id number] Yes TAKE 2 TABLETS (200 MG TOTAL) BY MOUTH DAILY. Chris [name], [place] Taking Active            Med Note Chris [name], Chris [name]   Fri Apr 08, 2023 11:32 AM)    apixaban  (ELIQUIS ) 5 MG TABS tablet [id number] Yes  Take 1 tablet (5 mg total) by mouth 2 (two) times daily. Chris [name] Taking Active   atorvastatin  (LIPITOR) 80 MG tablet [id number] Yes Take 1 tablet (80 mg total) by mouth daily. Chris [name] Taking Active   baclofen  (LIORESAL ) 10 MG tablet [id number] No Take 1 tablet  (10 mg total) by mouth 3 (three) times daily.  Patient not taking: Reported on 04/08/2023   Chris Chroman, [name] Not Taking Active   Blood Glucose Monitoring Suppl (ACCU-CHEK AVIVA PLUS) w/Device KIT [id number]  1 each by Does not apply route 3 (three) times daily. Chris [name]  Active   carvedilol  (COREG ) 12.5 MG tablet [id number] Yes Take 1 tablet (12.5 mg total) by mouth 2 (two) times daily. Chris [name] Taking Active   colchicine  0.6 MG tablet [id number] Yes SMARTSIG:0.5 Tablet(s) By Mouth 3 Times [name] Week Provider, Historical, [name] Taking Active   dapagliflozin  propanediol (FARXIGA ) 10 MG TABS tablet [id number] Yes Take 1 tablet (10 mg total) by mouth daily. Chris Barnie NOVAK, [name] Taking Active   doxycycline  (VIBRAMYCIN ) 100 MG capsule [id number] No Take 1 capsule (100 mg total) by mouth 2 (two) times daily. One po bid x 7 days  Patient not taking: Reported on 07/21/2023   Chris [name] Not Taking Active            Med Note (Chris [name]   Tue Jun 21, 2023  1:19 PM) completed  fluticasone -salmeterol (ADVAIR HFA) 230-21 MCG/ACT inhaler [id number] Yes Inhale 2 puffs into the lungs 2 (two) times daily. Chris Hose, [name] Taking Active   hydrOXYzine  (ATARAX ) 25 MG tablet [id number] Yes Take 1 tablet 3 times [name] day as needed for anxiety. Take 2 tablets at night as need for sleep Chris Barter, [name] Taking Active   isosorbide -hydrALAZINE  (BIDIL ) 20-37.5 MG tablet [id number] No Take 1 tablet by mouth 3 (three) times daily.  Patient not taking: Reported on 07/21/2023   Chris [name] Not Taking Active   lidocaine  (LIDODERM ) 5 % [id number] No Place 1 patch onto the skin daily. Remove & Discard patch within 12 hours or as directed by [name]  Patient not taking: Reported on 07/21/2023   Robinson, Chris [name] Not Taking Active   meloxicam  (MOBIC ) 15 MG tablet [id number] No Take 1 tablet (15 mg total) by mouth daily.  Patient not taking: Reported on 07/21/2023   Chris Kirk NOVAK, FNP Not Taking Active      Discontinued 07/11/20 0943 (Discontinued by provider)   mirtazapine  (REMERON ) 15 MG tablet [id number] Yes Take 1 tablet (15 mg total) by mouth at bedtime. Chris Barter, [name] Taking Active   oxyCODONE -acetaminophen  (PERCOCET/ROXICET) 5-325 MG tablet [id number] No Take 1 tablet by mouth every 4 (four) hours as needed for severe pain (pain score 7-10).  Patient not taking: Reported on 07/21/2023   Williams, Megan E, NP Not Taking Active   potassium chloride  (KLOR-CON  M) 10 MEQ tablet [id number] No Take 2 tablets (20 mEq total) by mouth daily.  Patient not taking: Reported on 07/21/2023   Chris [name] Not Taking Active   potassium chloride  (KLOR-CON ) 10 MEQ tablet [id number] Yes Take 20 mEq by mouth daily. Provider, Historical, [name] Taking Active   Tiotropium Bromide  Monohydrate (SPIRIVA  RESPIMAT) 2.5 MCG/ACT AERS [id number] Yes Inhale 2 puffs into the lungs daily. Chris Barnie NOVAK, [name] Taking Active   torsemide  (DEMADEX ) 20 MG tablet [id number] Yes Take 20 mg by mouth. Provider, Historical, [name] Taking Active   torsemide   40 MG TABS [id number] Yes Take 20 mg daily, alternating with 40 mg daily. Chris [name] Taking Active   traZODone  (DESYREL ) 50 MG tablet [id number] Yes Take 2 tablets (100 mg total) by mouth at bedtime. Chris Barter, [name] Taking Active             Patient Active Problem List   Diagnosis Date Noted   Post traumatic stress disorder 09/23/2022   MDD (major depressive disorder), recurrent episode, moderate (HCC) 08/26/2022   Stage 3b chronic kidney disease (HCC) 08/09/2022   Hypertrophic cardiomyopathy (HCC) 08/09/2022   History of renal cell cancer 08/09/2022   Chronic heart failure with preserved ejection fraction (HFpEF) (HCC) 07/13/2022   Agitation 04/23/2022   RSV (respiratory syncytial virus pneumonia) 04/22/2022   Obesity (BMI 30-39.9) 04/21/2022   COPD exacerbation (HCC) 04/19/2022   Dyslipidemia 04/19/2022   Gout 04/19/2022   Right kidney mass 05/14/2021   CHF  exacerbation (HCC) 04/14/2021   Chest pain 04/14/2021   Syncope 04/14/2021   Left-sided weakness 10/28/2020   Typical atrial flutter (HCC)    CHF (congestive heart failure) (HCC) 07/04/2020   Acute exacerbation of CHF (congestive heart failure) (HCC) 06/16/2020   Influenza vaccine refused 05/06/2020   Acute decompensated heart failure (HCC) 05/04/2020   Illiteracy 05/04/2020   Type 2 diabetes mellitus with stage 3 chronic kidney disease (HCC) 12/25/2019   Elevated troponin I level 10/26/2019   History of gout 02/01/2019   Seasonal allergic rhinitis due to pollen 02/01/2019   Nicotine  use disorder 11/30/2018   Microscopic hematuria 11/30/2018   Depression 11/30/2018   Difficulty controlling anger 11/30/2018   COPD (chronic obstructive pulmonary disease) (HCC)    CKD (chronic kidney disease) stage 3, GFR 30-59 ml/min (HCC) 08/10/2018   Recurrent epistaxis 04/21/2018   Mixed hyperlipidemia 07/28/2017   Essential hypertension 07/28/2017   Chronic systolic heart failure (HCC) 10/25/2014   Cocaine abuse (HCC) 02/20/2013   Cannabis abuse 02/20/2013   Back pain, chronic 02/20/2013    Conditions to be addressed/monitored per PCP order:  COPD and Pain  Care Plan : RN Care Manager Plan of Care  Updates made by Chris Andrea LABOR, RN since 07/21/2023 12:00 AM     Problem: Health Management Needs Related to Heart Failure and Chronic Pain   Priority: High     Long-Range Goal: Independent self health management of Heart Failure and Chronic Pain   Start Date: 07/23/2022  Expected End Date: 06/14/2023  Recent Progress: On track  Priority: High  Note:   Current Barriers:  Knowledge Deficits related to plan of care for management of CHF and Chronic Pain Care Coordination needs related to Transportation  Non-adherence to prescribed medication regimen Difficulty obtaining medications  RNCM Clinical Goal(s):  Patient will take all medications exactly as prescribed and will call provider for  medication related questions as evidenced by medication review and patient interview demonstrate Ongoing adherence to prescribed treatment plan for CHF as evidenced by medication review continue to work with RN Care Manager to address care management and care coordination needs related to  CHF as evidenced by adherence to CM Team Scheduled appointments work with child psychotherapist to address  related to the management of Limited social support, Transportation, and Lack of essential utilities - light/power* related to the management of CHF as evidenced by review of EMR and patient or child psychotherapist report through collaboration with Medical Illustrator, provider, and care team.   Interventions: Inter-disciplinary care team collaboration (see longitudinal plan of care) Evaluation  of current treatment plan related to  self management and patient's adherence to plan as established by provider Reviewed upcoming appointments including:   07/26/23 with Cardiology and PCP Advised patient to take all medications with him to upcoming provider appointments Ensured patient will arrange transportation to upcoming appointments Rescheduled missed appointment with [name]-08/02/23   COPD Interventions:  (Status:  Goal on track:  Yes.) Long Term Goal Advised patient to track and manage COPD triggers Advised patient to engage in light exercise as tolerated 3-5 days [name] week to aid in the the management of COPD Provided education about and advised patient to utilize infection prevention strategies to reduce risk of respiratory infection Discussed the importance of adequate rest and management of fatigue with COPD Assessed social determinant of health barriers Advised patient on smoking cessation, provided education Discussed patient has quit smoking 8 days ago(07/13/23) Provided encouragement to continue smoking cessation   Pain Interventions:  (Status:  Goal on track) Long Term Goal-right hip pain 10/10, left shoulder pain  10/10 Pain assessment performed Medications reviewed: advised patient to contact [facility] and have all medications transferred from [facility] to [facility] Reviewed provider established plan for pain management Discussed importance of adherence to all scheduled medical appointments Counseled on the importance of reporting any/all new or changed pain symptoms or management strategies to pain management provider Reviewed with patient prescribed pharmacological and nonpharmacological pain relief strategies Reviewed provider notes and discussed Assisted with scheduling Orthopaedic appointment for new shoulder pain and hip pain-scheduled for 07/27/23 at 1pm  Patient Goals/Self-Care Activities: Take all medications as prescribed Attend all scheduled provider appointments Call provider office for new concerns or questions  Work with the social worker to address care coordination needs and will continue to work with the clinical team to address health care and disease management related needs call 1-800-273-TALK (toll free, 24 hour hotline) if experiencing [name] Mental Health or Behavioral Health Crisis   Follow Up Plan:  Telephone follow up appointment with care management team member scheduled for:  07/21/23 at 1:15pm     Follow Up:  Patient agrees to Care Plan and Follow-up.  Plan: The Managed Medicaid care management team will reach out to the patient again over the next 30 days.  Date/time of next scheduled RN care management/care coordination outreach:  08/22/23 at 10:30am  Andrea Dimes RN, BSN [place]  Value-Based [facility] Health RN Care Manager [phone]

## 2023-07-21 NOTE — Patient Instructions (Signed)
 Visit Information  [name] was given information about Medicaid Managed Care team care coordination services as a part of their [facility] Medicaid benefit. Chris [name] verbally consented to engagement with the [facility] Managed Care team.   If you are experiencing a medical emergency, please call 911 or report to your local emergency department or urgent care.   If you have a non-emergency medical problem during routine business hours, please contact your provider's office and ask to speak with a nurse.   For questions related to your [facility] health plan, please call: [phone] or visit the homepage here: [email or web address]  If you would like to schedule transportation through your [facility] plan, please call the following number at least 2 days in advance of your appointment: [phone]  For information about your ride after you set it up, call Ride Assist at [phone]. Use this number to activate a Will Call pickup, or if your transportation is late for a scheduled pickup. Use this number, too, if you need to make a change or cancel a previously scheduled reservation.  If you need transportation services right away, call [phone]. The after-hours call center is staffed 24 hours to handle ride assistance and urgent reservation requests (including discharges) 365 days a year. Urgent trips include sick visits, hospital discharge requests and life-sustaining treatment.  Call the [facility] Line at [phone], at any time, 24 hours a day, 7 days a week. If you are in danger or need immediate medical attention call 911.  If you would like help to quit smoking, call 1-800-QUIT-NOW (719-387-8711) OR Espaol: 1-855-Djelo-Ya (8-144-664-6430) o para ms informacin haga clic aqu or Text READY to 799-599 to register via text  Chris [name],   Please see education materials related to smoking cessation  provided by MyChart link.  Patient verbalizes understanding of instructions and care plan provided today and agrees to view in MyChart. Active MyChart status and patient understanding of how to access instructions and care plan via MyChart confirmed with patient.     Telephone follow up appointment with Managed Medicaid care management team member scheduled for:08/22/23 at 10:30am  Andrea Dimes RN, BSN [place]  Value[facility]Based [facility] Health RN Care Manager [phone]   Following is a copy of your plan of care:  Care Plan : RN Care Manager Plan of Care  Updates made by Dimes Andrea LABOR, RN since 07/21/2023 12:00 AM     Problem: Health Management Needs Related to Heart Failure and Chronic Pain   Priority: High     Long-Range Goal: Independent self health management of Heart Failure and Chronic Pain   Start Date: 07/23/2022  Expected End Date: 06/14/2023  Recent Progress: On track  Priority: High  Note:   Current Barriers:  Knowledge Deficits related to plan of care for management of CHF and Chronic Pain Care Coordination needs related to Transportation  Non-adherence to prescribed medication regimen Difficulty obtaining medications  RNCM Clinical Goal(s):  Patient will take all medications exactly as prescribed and will call provider for medication related questions as evidenced by medication review and patient interview demonstrate Ongoing adherence to prescribed treatment plan for CHF as evidenced by medication review continue to work with RN Care Manager to address care management and care coordination needs related to  CHF as evidenced by adherence to CM Team Scheduled appointments work with social worker to address  related to the management of Limited social support, Transportation, and Lack of essential utilities - light/power*  related to the management of CHF as evidenced by review of EMR and patient or social worker report through collaboration with Research Officer, [employer], provider, and care team.   Interventions: Inter-disciplinary care team collaboration (see longitudinal plan of care) Evaluation of current treatment plan related to  self management and patient's adherence to plan as established by provider Reviewed upcoming appointments including:   07/26/23 with Cardiology and PCP Advised patient to take all medications with him to upcoming provider appointments Ensured patient will arrange transportation to upcoming appointments Rescheduled missed appointment with [name]-08/02/23   COPD Interventions:  (Status:  Goal on track:  Yes.) Long Term Goal Advised patient to track and manage COPD triggers Advised patient to engage in light exercise as tolerated 3-5 days a week to aid in the the management of COPD Provided education about and advised patient to utilize infection prevention strategies to reduce risk of respiratory infection Discussed the importance of adequate rest and management of fatigue with COPD Assessed social determinant of health barriers Advised patient on smoking cessation, provided education Discussed patient has quit smoking 8 days ago(07/13/23) Provided encouragement to continue smoking cessation   Pain Interventions:  (Status:  Goal on track) Long Term Goal-right hip pain 10/10, left shoulder pain 10/10 Pain assessment performed Medications reviewed: advised patient to contact [facility] and have all medications transferred from [facility] to [facility] Reviewed provider established plan for pain management Discussed importance of adherence to all scheduled medical appointments Counseled on the importance of reporting any/all new or changed pain symptoms or management strategies to pain management provider Reviewed with patient prescribed pharmacological and nonpharmacological pain relief strategies Reviewed provider notes and discussed Assisted with scheduling Orthopaedic appointment for new shoulder  pain and hip pain-scheduled for 07/27/23 at 1pm  Patient Goals/Self-Care Activities: Take all medications as prescribed Attend all scheduled provider appointments Call provider office for new concerns or questions  Work with the social worker to address care coordination needs and will continue to work with the clinical team to address health care and disease management related needs call 1-800-273-TALK (toll free, 24 hour hotline) if experiencing a Mental Health or Behavioral Health Crisis   Follow Up Plan:  Telephone follow up appointment with care management team member scheduled for:  07/21/23 at 1:15pm

## 2023-07-22 ENCOUNTER — Telehealth (HOSPITAL_COMMUNITY): Payer: Self-pay

## 2023-07-22 ENCOUNTER — Ambulatory Visit: Payer: Self-pay | Admitting: Internal Medicine

## 2023-07-22 ENCOUNTER — Other Ambulatory Visit: Payer: Self-pay | Admitting: Internal Medicine

## 2023-07-22 NOTE — Telephone Encounter (Signed)
  Chief Complaint: R foot pain Symptoms: swelling, redness, pain R great toe Frequency: 3 days Pertinent Negatives: Patient denies streaking Disposition: [] ED /[x] Urgent Care (no appt availability in office) / [] Appointment(In office/virtual)/ []  [place] [facility]/ [] Home Care/ [] Refused Recommended Disposition /[] [facility] [facility]/ []  Follow-up with PCP Additional Notes: Patient calls stating he is having a gout flare up in his R great toe/foot. States he is having difficulty ambulating due to pain and swelling. Patient states he called in this morning for refill, but appears it has not been reviewed at this time. Per protocol, patient to be evaluated within 4 hours. No availability in clinic. Patient states he cannot travel to an urgent care. Patient requests virtual visit, states he cannot wait all weekend. Scheduled for virtual visit for 07/23/23 at 0845. Care advice reviewed, patient verbalized understanding. Advised to monitor for worsening s/s and call back if needed. Alerting PCP for review.   Copied from CRM [id number]. Topic: Clinical - Medication Question >> Jul 22, 2023  3:27 PM Yolanda T wrote: Reason for CRM: Patient said he is unable to walk and would need his medication for the weekend Reason for Disposition  [1] SEVERE pain (e.g., excruciating, unable to do any normal activities) AND [2] not improved after 2 hours of pain medicine  Answer Assessment - Initial Assessment Questions 1. ONSET: When did the pain start?     3rd day 2. LOCATION: Where is the pain located?       R foot pain, states it is great toe 3. PAIN: How bad is the pain?    (Scale 1-10; or mild, moderate, severe)  - MILD (1-3): doesn't interfere with normal activities.   - MODERATE (4-7): interferes with normal activities (e.g., work or school) or awakens from sleep, limping.   - SEVERE (8-10): excruciating pain, unable to do any normal activities, unable to walk.      10/10 4. WORK OR EXERCISE:  Has there been any recent work or exercise that involved this part of the body?      Denies 5. CAUSE: What do you think is causing the foot pain?     Gout 6. OTHER SYMPTOMS: Do you have any other symptoms? (e.g., leg pain, rash, fever, numbness)     Redness on toe  Protocols used: Foot Pain-A-AH

## 2023-07-22 NOTE — Telephone Encounter (Signed)
 Chris [name] calls this a.m. and leaves a message requesting a return call.  This therapist returns his call and verifies his identity by obtaining two identifiers. He says he wants to be part of the intensive program we offer. Therapist looks up his insurance and it is [employer]. Therapist asks if he still has this insurance and he says, yes. Therapist explains his insurance will not pay for SA IOP.  Therapist explains he can see her for individual. He asks what the fastest way to get in was and therapist explains she does walk in clinic on Tuesday and he would need to be here at 7am and go to the desk and get a clip board and complete the paperwork on the clip board.  He says he will come in then.  Chris Simpler, MS, LMFT, LCAS

## 2023-07-22 NOTE — Telephone Encounter (Signed)
 Copied from CRM [id number]. Topic: Clinical - Medication Refill >> Jul 22, 2023  9:45 AM Georgia RAMAN wrote: Most Recent Primary Care Visit:  Provider: DANTON JON HERO  Department: [facility]  Visit Type: OFFICE VISIT  Date: 01/05/2023  Medication: allopurinol  (ZYLOPRIM ) 100 MG tablet  Has the patient contacted their pharmacy? Yes (Agent: If no, request that the patient contact the pharmacy for the refill. If patient does not wish to contact the pharmacy document the reason why and proceed with request.) (Agent: If yes, when and what did the pharmacy advise?)  Is this the correct pharmacy for this prescription? Yes If no, delete pharmacy and type the correct one.  This is the patient's preferred pharmacy:  [facility] - [place], [place] - [address] [place] [address] Phone: [phone] Fax: [phone]   Has the prescription been filled recently? Yes  Is the patient out of the medication? Yes  Has the patient been seen for an appointment in the last year OR does the patient have an upcoming appointment? Yes  Can we respond through MyChart? Yes  Agent: Please be advised that Rx refills may take up to 3 business days. We ask that you follow-up with your pharmacy.

## 2023-07-23 ENCOUNTER — Telehealth: Payer: Medicaid Other

## 2023-07-23 ENCOUNTER — Other Ambulatory Visit: Payer: Self-pay | Admitting: Internal Medicine

## 2023-07-23 ENCOUNTER — Encounter: Payer: Self-pay | Admitting: Internal Medicine

## 2023-07-23 MED ORDER — PREDNISONE 20 MG PO TABS: ORAL_TABLET | ORAL | 0 refills | Status: DC

## 2023-07-25 NOTE — Progress Notes (Signed)
Electrophysiology Office Note:   Date:  07/26/2023  ID:  Chris [name], DOB January 03, 1961, MRN [id number]  Primary Cardiologist: [name], MD Electrophysiologist: [name] Small, MD      History of Present Illness:   Chris [name] is a 63 y.o. male with h/o AFL, possible hypertrophic cardiomyopathy, HFpEF, renal cell carcinoma status post ablation seen today for routine electrophysiology followup.   Since last being seen in our clinic the patient reports doing well.  No further episodes of irregular heart rates or associated symptoms.  He reports he has been watching his fluids / wt closely and is doing well.  He denies chest pain, palpitations, dyspnea, PND, orthopnea, nausea, vomiting, dizziness, syncope, edema, weight gain, or early satiety.   Review of systems complete and found to be negative unless listed in HPI.   EP Information / Studies Reviewed:    EKG is ordered today. Personal review as below.  EKG Interpretation Date/Time:  Tuesday July 26 2023 11:13:49 EST Ventricular Rate:  84 PR Interval:  128 QRS Duration:  110 QT Interval:  416 QTC Calculation: 491 R Axis:   -37  Text Interpretation: Sinus rhythm with occasional Premature ventricular complexes Left axis deviation Confirmed by Canary Brim ([id number]) on 07/26/2023 1:08:40 PM    Studies:  cMRI 05/2022 > poor study, asymmetric septal hypertrophy with a maximal septal wall thickness of 1.8 cm.  Mid wall LGE is present in the basal septum.  No LV outflow tract obstruction noted.  Findings consistent with HCM, asymmetric septal variant Zio 07/2022 > SR predominant, 1.5% PVC's, 6 episodes of NSVT over the 14 day monitoring period, longest 7 beats, the fastest was 188 bpm. There were 3 patient triggered events correlating with sinus rhythm and, on 1 occasion, a PVC.  No fibrillation or flutter detected ECHO 10/2022 > LVEF 45-50%.  Moderate LV septal hypertrophy, grade 1 DD Coronary Angiogram 11/2022 > 65%  proximal RCA stenosis, low cardiac output    Arrhythmia / AAD NSVT  PVC's > 1.5% on monitor 07/2022   Physical Exam:   VS:  BP 124/82   Pulse 84   Ht 6' (1.829 m)   Wt 276 lb 6.4 oz (125.4 kg)   SpO2 92%   BMI 37.49 kg/m    Wt Readings from Last 3 Encounters:  07/26/23 276 lb 6.4 oz (125.4 kg)  07/15/23 160 lb 15 oz (73 kg)  06/16/23 270 lb (122.5 kg)    GEN: No acute distress NECK: No JVD; No carotid bruits CARDIAC: Regular rate and rhythm, no murmurs, rubs, gallops RESPIRATORY:  Clear to auscultation without rales, wheezing or rhonchi  ABDOMEN: Soft, non-tender, non-distended EXTREMITIES:  No edema; No deformity   ASSESSMENT AND PLAN:    AFL  Single episode  -asymptomatic  -no further episodes   -OAC for stroke prophylaxis   Secondary Hypercoagulable State  -continue Eliquis, dose reviewed and appropriate by age/wt   NSVT  NSVT on monitor was relatively slow & infrequent  -follow  -continue coreg 12.5mg  BID   HOCM Some LGE on cMRI, hx of severe cardiomyopathy attributable to AFL, EF recovered but slightly low. Hx of non-arrhythmogenic syncope (was on monitor).   -previously discussed his risk of SCD with Dr. Nelly Laurence & pt wanted to consider and follow up regarding ICD placement.  He was felt to be on the lower end of risk scoring at the 01/2023 visit.    Tobacco Abuse  -pt quit smoking one week ago, encouraged ongoing cessation  Note patient was agitated in clinic, upset as he arrived early for his appt and complained that staff were late getting to him.  He became agitated and cursed at medical assistants.  Print production planner notified.  He calmed down and visit was completed. He apologized for behavior after the fact.       Follow up with Dr. Nelly Laurence in 6 months  Signed, Canary Brim, NP-C, AGACNP-BC [facility] - Electrophysiology  07/26/2023, 1:11 PM

## 2023-07-26 ENCOUNTER — Encounter: Payer: Self-pay | Admitting: Pulmonary Disease

## 2023-07-26 ENCOUNTER — Ambulatory Visit: Payer: Medicaid Other | Attending: Internal Medicine | Admitting: Internal Medicine

## 2023-07-26 ENCOUNTER — Ambulatory Visit: Payer: Medicaid Other | Attending: Pulmonary Disease | Admitting: Pulmonary Disease

## 2023-07-26 ENCOUNTER — Encounter: Payer: Self-pay | Admitting: Internal Medicine

## 2023-07-26 ENCOUNTER — Encounter (HOSPITAL_COMMUNITY): Payer: Self-pay

## 2023-07-26 ENCOUNTER — Other Ambulatory Visit: Payer: Self-pay | Admitting: Acute Care

## 2023-07-26 ENCOUNTER — Ambulatory Visit (INDEPENDENT_AMBULATORY_CARE_PROVIDER_SITE_OTHER): Payer: Medicaid Other

## 2023-07-26 VITALS — BP 162/106 | HR 77 | Wt 276.8 lb

## 2023-07-26 VITALS — BP 124/82 | HR 84 | Ht 72.0 in | Wt 276.4 lb

## 2023-07-26 DIAGNOSIS — M109 Gout, unspecified: Secondary | ICD-10-CM

## 2023-07-26 DIAGNOSIS — I4892 Unspecified atrial flutter: Secondary | ICD-10-CM

## 2023-07-26 DIAGNOSIS — E1169 Type 2 diabetes mellitus with other specified complication: Secondary | ICD-10-CM

## 2023-07-26 DIAGNOSIS — F4310 Post-traumatic stress disorder, unspecified: Secondary | ICD-10-CM | POA: Diagnosis not present

## 2023-07-26 DIAGNOSIS — J449 Chronic obstructive pulmonary disease, unspecified: Secondary | ICD-10-CM

## 2023-07-26 DIAGNOSIS — D6869 Other thrombophilia: Secondary | ICD-10-CM

## 2023-07-26 DIAGNOSIS — I4729 Other ventricular tachycardia: Secondary | ICD-10-CM

## 2023-07-26 DIAGNOSIS — F331 Major depressive disorder, recurrent, moderate: Secondary | ICD-10-CM | POA: Diagnosis not present

## 2023-07-26 DIAGNOSIS — Z125 Encounter for screening for malignant neoplasm of prostate: Secondary | ICD-10-CM | POA: Diagnosis not present

## 2023-07-26 DIAGNOSIS — Z87891 Personal history of nicotine dependence: Secondary | ICD-10-CM

## 2023-07-26 DIAGNOSIS — Z23 Encounter for immunization: Secondary | ICD-10-CM

## 2023-07-26 DIAGNOSIS — Z139 Encounter for screening, unspecified: Secondary | ICD-10-CM

## 2023-07-26 DIAGNOSIS — E669 Obesity, unspecified: Secondary | ICD-10-CM

## 2023-07-26 DIAGNOSIS — I5032 Chronic diastolic (congestive) heart failure: Secondary | ICD-10-CM

## 2023-07-26 DIAGNOSIS — F1410 Cocaine abuse, uncomplicated: Secondary | ICD-10-CM

## 2023-07-26 DIAGNOSIS — F1020 Alcohol dependence, uncomplicated: Secondary | ICD-10-CM

## 2023-07-26 DIAGNOSIS — F1220 Cannabis dependence, uncomplicated: Secondary | ICD-10-CM

## 2023-07-26 DIAGNOSIS — F1210 Cannabis abuse, uncomplicated: Secondary | ICD-10-CM

## 2023-07-26 DIAGNOSIS — Z7984 Long term (current) use of oral hypoglycemic drugs: Secondary | ICD-10-CM | POA: Diagnosis not present

## 2023-07-26 DIAGNOSIS — Z122 Encounter for screening for malignant neoplasm of respiratory organs: Secondary | ICD-10-CM

## 2023-07-26 DIAGNOSIS — N1832 Chronic kidney disease, stage 3b: Secondary | ICD-10-CM

## 2023-07-26 DIAGNOSIS — F17210 Nicotine dependence, cigarettes, uncomplicated: Secondary | ICD-10-CM

## 2023-07-26 DIAGNOSIS — I152 Hypertension secondary to endocrine disorders: Secondary | ICD-10-CM

## 2023-07-26 DIAGNOSIS — F1491 Cocaine use, unspecified, in remission: Secondary | ICD-10-CM

## 2023-07-26 LAB — POCT GLYCOSYLATED HEMOGLOBIN (HGB A1C): HbA1c, POC (controlled diabetic range): 6.6 % (ref 0.0–7.0)

## 2023-07-26 LAB — GLUCOSE, POCT (MANUAL RESULT ENTRY): POC Glucose: 104 mg/dL — AB (ref 70–99)

## 2023-07-26 MED ORDER — SPIRIVA RESPIMAT 2.5 MCG/ACT IN AERS
2.0000 | INHALATION_SPRAY | Freq: Every day | RESPIRATORY_TRACT | 6 refills | Status: AC
Start: 2023-07-26 — End: ?

## 2023-07-26 MED ORDER — DAPAGLIFLOZIN PROPANEDIOL 10 MG PO TABS: 10.0000 mg | ORAL_TABLET | Freq: Every day | ORAL | 3 refills | Status: DC

## 2023-07-26 MED ORDER — ISOSORB DINITRATE-HYDRALAZINE 20-37.5 MG PO TABS: 1.0000 | ORAL_TABLET | Freq: Three times a day (TID) | ORAL | 3 refills | Status: DC

## 2023-07-26 MED ORDER — POTASSIUM CHLORIDE ER 10 MEQ PO TBCR: 20.0000 meq | EXTENDED_RELEASE_TABLET | Freq: Every day | ORAL | 2 refills | Status: DC

## 2023-07-26 MED ORDER — ALLOPURINOL 100 MG PO TABS: ORAL_TABLET | Freq: Every day | ORAL | 6 refills | Status: DC

## 2023-07-26 MED ORDER — TORSEMIDE 40 MG PO TABS: ORAL_TABLET | ORAL | 1 refills | Status: DC

## 2023-07-26 MED ORDER — ATORVASTATIN CALCIUM 80 MG PO TABS: 80.0000 mg | ORAL_TABLET | Freq: Every day | ORAL | 1 refills | Status: DC

## 2023-07-26 MED ORDER — ALBUTEROL SULFATE HFA 108 (90 BASE) MCG/ACT IN AERS: 2.0000 | INHALATION_SPRAY | Freq: Four times a day (QID) | RESPIRATORY_TRACT | 6 refills | Status: AC | PRN

## 2023-07-26 MED ORDER — FLUTICASONE-SALMETEROL 230-21 MCG/ACT IN AERO: 2.0000 | INHALATION_SPRAY | Freq: Two times a day (BID) | RESPIRATORY_TRACT | 12 refills | Status: DC

## 2023-07-26 MED ORDER — CARVEDILOL 12.5 MG PO TABS: 12.5000 mg | ORAL_TABLET | Freq: Two times a day (BID) | ORAL | 3 refills | Status: DC

## 2023-07-26 NOTE — Progress Notes (Unsigned)
Patient ID: Chris [name], male    DOB: 1961-03-08  MRN: [id number]  CC: Medical Management of Chronic Issues (Patient had previously Gout ) and Medication Refill   Subjective: Chris [name] is a 63 y.o. male who presents for chronic ds management. 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  History of Present Illness   The patient, with a history of gout, CHF, diabetes, COPD, and depression, called last week due to a flare-up of his gout. The patient had run out of his allopurinol medication, which he was taking two tablets of 100mg  daily.  Flare started several days after running out. His sister is also on Allopurinol so he borrowed several from her. He was supposed to have a video visit with an APP but overslept.  I subsequently called in a prescription for some prednisone for him which she has picked up but has not started taking because the pain in the right big toe has just about subsided.    The patient also mentions having issues with his medication, including throwing away all his medication out of anger.  Does not have any medicines with him today but wanted me to go through his medication list and send refills.  He was able to tell me if he was supposed to be on a medication when I gave him the name and the dosage.    CHF/HTN/history of a flutter: He confirms that he should be on Farxiga 10 mg daily, torsemide  alternating 20 mg daily with 40 mg on the other days Eliquis 5 mg twice a day, BiDil 20/37.5 mg 1 tablet 3 times a day, potassium 10 mEq 2 tablets daily, carvedilol 12.5 mg twice a day.  Blood pressure elevated today because he had thrown out his medications.  Denies any chest pains, shortness of breath or lower extremity edema.  COPD: Should be on Advair and Spiriva inhalers.  Denies any chronic cough at this time.  He has quit smoking.  Substance use disorder: Reports he stopped using cocaine 6 months ago.  Endorses smoking marijuana regularly.  MDD insomnia: Followed by [facility] behavioral health.  Reports doing well on his medications.  The patient has recently quit smoking and cocaine use. The patient also mentions having issues with mucus, which he would like a stronger medication for.     DM Results for orders placed or performed in visit on 07/26/23  POCT glucose (manual entry)   Collection Time: 07/26/23  3:08 PM  Result Value Ref Range   POC Glucose 104 (A) 70 - 99 mg/dl  POCT glycosylated hemoglobin (Hb A1C)   Collection Time: 07/26/23  3:12 PM  Result Value Ref Range   Hemoglobin A1C     HbA1c POC (<> result, manual entry)     HbA1c, POC (prediabetic range)     HbA1c, POC (controlled diabetic range) 6.6 0.0 - 7.0 %  PSA   Collection Time: 07/26/23  4:48  PM  Result Value Ref Range   Prostate Specific Ag, Serum 1.0 0.0 - 4.0 ng/mL  Microalbumin / creatinine urine ratio   Collection Time: 07/26/23  4:48 PM  Result Value Ref Range   Creatinine, Urine WILL FOLLOW    Microalbumin, Urine WILL FOLLOW    Microalb/Creat Ratio WILL FOLLOW   On Farxiga.  Trying to do better with eating habits.  Should be on atorvastatin.  CKD 3: Recent GFR's have been in the 30s.  He has not seen his nephrologist Dr. Thedore Mins in a while.  He is agreeable to calling and scheduling a follow-up appointment.  Patient Active Problem List   Diagnosis Date Noted   Post traumatic stress disorder  09/23/2022   MDD (major depressive disorder), recurrent episode, moderate (HCC) 08/26/2022   Stage 3b chronic kidney disease (HCC) 08/09/2022   Hypertrophic cardiomyopathy (HCC) 08/09/2022   History of renal cell cancer 08/09/2022   Chronic heart failure with preserved ejection fraction (HFpEF) (HCC) 07/13/2022   Agitation 04/23/2022   RSV (respiratory syncytial virus pneumonia) 04/22/2022   Obesity (BMI 30-39.9) 04/21/2022   COPD exacerbation (HCC) 04/19/2022   Dyslipidemia 04/19/2022   Gout 04/19/2022   Right kidney mass 05/14/2021   CHF exacerbation (HCC) 04/14/2021   Chest pain 04/14/2021   Syncope 04/14/2021   Left-sided weakness 10/28/2020   Typical atrial flutter (HCC)    CHF (congestive heart failure) (HCC) 07/04/2020   Acute exacerbation of CHF (congestive heart failure) (HCC) 06/16/2020   Influenza vaccine refused 05/06/2020   Acute decompensated heart failure (HCC) 05/04/2020   Illiteracy 05/04/2020   Type 2 diabetes mellitus with stage 3 chronic kidney disease (HCC) 12/25/2019   Elevated troponin I level 10/26/2019   History of gout 02/01/2019   Seasonal allergic rhinitis due to pollen 02/01/2019   Nicotine use disorder 11/30/2018   Microscopic hematuria 11/30/2018   Depression 11/30/2018   Difficulty controlling anger 11/30/2018   COPD (chronic obstructive pulmonary disease) (HCC)    CKD (chronic kidney disease) stage 3, GFR 30-59 ml/min (HCC) 08/10/2018   Recurrent epistaxis 04/21/2018   Mixed hyperlipidemia 07/28/2017   Essential hypertension 07/28/2017   Chronic systolic heart failure (HCC) 10/25/2014   Cocaine abuse (HCC) 02/20/2013   Cannabis abuse 02/20/2013   Back pain, chronic 02/20/2013     Current Outpatient Medications on File Prior to Visit  Medication Sig Dispense Refill   Accu-Chek Softclix Lancets lancets Use as instructed 100 each 12   acetaminophen (TYLENOL) 500 MG tablet Take 1-2 tablets (500-1,000 mg total) by mouth every 6 (six) hours as  needed (pain.). 30 tablet 0   apixaban (ELIQUIS) 5 MG TABS tablet Take 1 tablet (5 mg total) by mouth 2 (two) times daily. 60 tablet 11   Blood Glucose Monitoring Suppl (ACCU-CHEK AVIVA PLUS) w/Device KIT 1 each by Does not apply route 3 (three) times daily. 1 kit 0   colchicine 0.6 MG tablet SMARTSIG:0.5 Tablet(s) By Mouth 3 Times a Week     hydrOXYzine (ATARAX) 25 MG tablet Take 1 tablet 3 times a day as needed for anxiety. Take 2 tablets at night as need for sleep 270 tablet 0   lidocaine (LIDODERM) 5 % Place 1 patch onto the skin daily. Remove & Discard patch within 12 hours or as directed by MD 30 patch 0   mirtazapine (REMERON) 15 MG tablet Take 1 tablet (15 mg total) by mouth at bedtime. 90 tablet 0   oxyCODONE-acetaminophen (PERCOCET/ROXICET) 5-325 MG tablet Take 1 tablet by mouth  every 4 (four) hours as needed for severe pain (pain score 7-10). 20 tablet 0   predniSONE (DELTASONE) 20 MG tablet 1 tab PO daily x 5 days then 1/2 tab daily x 5 days 8 tablet 0   traZODone (DESYREL) 50 MG tablet Take 2 tablets (100 mg total) by mouth at bedtime. 180 tablet 0   albuterol (PROVENTIL) (2.5 MG/3ML) 0.083% nebulizer solution Take 3 mLs (2.5 mg total) by nebulization every 4 (four) hours as needed for wheezing or shortness of breath. 300 mL 2   [DISCONTINUED] metoprolol tartrate (LOPRESSOR) 100 MG tablet Take 1 tablet (100 mg total) by mouth 2 (two) times daily. 60 tablet 0   No current facility-administered medications on file prior to visit.    No Known Allergies  Social History   Socioeconomic History   Marital status: Divorced    Spouse name: Not on file   Number of children: 3   Years of education: Not on file   Highest education level: High school graduate  Occupational History   Occupation: disability  Tobacco Use   Smoking status: Every Day    Current packs/day: 1.00    Average packs/day: 1 pack/day for 43.0 years (43.0 ttl pk-yrs)    Types: Cigarettes   Smokeless tobacco: Never    Tobacco comments:        6 cigs daily--03/28/2023  Vaping Use   Vaping status: Never Used  Substance and Sexual Activity   Alcohol use: Yes    Alcohol/week: 4.0 standard drinks of alcohol    Types: 4 Shots of liquor per week   Drug use: Yes    Frequency: 21.0 times per week    Types: Marijuana    Comment: last use Cocaine- 03/28/2021. Still using marijuana, last use 06/22/21   Sexual activity: Not on file  Other Topics Concern   Not on file  Social History Narrative   ** Merged History Encounter **       Social Drivers of Health   Financial Resource Strain: High Risk (07/26/2023)   Overall Financial Resource Strain (CARDIA)    Difficulty of Paying Living Expenses: Very hard  Food Insecurity: Food Insecurity Present (07/26/2023)   Hunger Vital Sign    Worried About Running Out of Food in the Last Year: Sometimes true    Ran Out of Food in the Last Year: Sometimes true  Transportation Needs: Unmet Transportation Needs (07/26/2023)   PRAPARE - Administrator, Civil Service (Medical): Yes    Lack of Transportation (Non-Medical): Yes  Physical Activity: Insufficiently Active (11/16/2021)   Exercise Vital Sign    Days of Exercise per Week: 5 days    Minutes of Exercise per Session: 20 min  Stress: Stress Concern Present (07/26/2023)   [employer] of Occupational Health - Occupational Stress Questionnaire    Feeling of Stress : Very much  Social Connections: Moderately Integrated (07/26/2023)   Social Connection and Isolation Panel [NHANES]    Frequency of Communication with Friends and Family: More than three times a week    Frequency of Social Gatherings with Friends and Family: Once a week    Attends Religious Services: 1 to 4 times per year    Active Member of [employer] or Organizations: Yes    Attends Banker Meetings: Never    Marital Status: Divorced  Catering manager Violence: Not At Risk (04/20/2022)   Humiliation, Afraid, Rape, and Kick  questionnaire    Fear of Current or Ex-Partner: No  Emotionally Abused: No    Physically Abused: No    Sexually Abused: No    Family History  Problem Relation Age of Onset   Heart disease Father    Diabetes Mother    HIV Brother    Healthy Son    Healthy Daughter     Past Surgical History:  Procedure Laterality Date   ANKLE SURGERY     CARDIAC CATHETERIZATION     CARDIOVERSION N/A 07/08/2020   Procedure: CARDIOVERSION;  Surgeon: Lamar Blinks, MD;  Location: [facility] ORS;  Service: Cardiovascular;  Laterality: N/A;   COLONOSCOPY WITH PROPOFOL N/A 11/19/2021   Procedure: COLONOSCOPY WITH PROPOFOL;  Surgeon: Jenel Lucks, MD;  Location: [facility];  Service: Gastroenterology;  Laterality: N/A;   HERNIA REPAIR     x2   IR RADIOLOGIST EVAL & MGMT  05/13/2022   IR RADIOLOGIST EVAL & MGMT  10/27/2022   IR RADIOLOGIST EVAL & MGMT  03/02/2023   POLYPECTOMY  11/19/2021   Procedure: POLYPECTOMY;  Surgeon: Jenel Lucks, MD;  Location: [facility];  Service: Gastroenterology;;   RADIOLOGY WITH ANESTHESIA N/A 06/23/2022   Procedure: Right renal tumor ablation;  Surgeon: Bennie Dallas, MD;  Location: [facility] OR;  Service: Radiology;  Laterality: N/A;   RIGHT HEART CATH N/A 07/13/2022   Procedure: RIGHT HEART CATH;  Surgeon: Laurey Morale, MD;  Location: [facility] INVASIVE CV LAB;  Service: Cardiovascular;  Laterality: N/A;   RIGHT/LEFT HEART CATH AND CORONARY ANGIOGRAPHY N/A 11/19/2022   Procedure: RIGHT/LEFT HEART CATH AND CORONARY ANGIOGRAPHY;  Surgeon: Laurey Morale, MD;  Location[facility] INVASIVE CV LAB;  Service: Cardiovascular;  Laterality: N/A;   SHOULDER SURGERY     TEE WITHOUT CARDIOVERSION N/A 07/08/2020   Procedure: TRANSESOPHAGEAL ECHOCARDIOGRAM (TEE);  Surgeon: Lamar Blinks, MD;  Location: [facility] ORS;  Service: Cardiovascular;  Laterality: N/A;    ROS: Review of Systems Negative except as stated above  PHYSICAL EXAM: BP (!) 162/106 (BP Location: Left Arm, Patient  Position: Sitting, Cuff Size: Large)   Pulse 77   Wt 276 lb 12.8 oz (125.6 kg)   SpO2 95%   BMI 37.54 kg/m   Wt Readings from Last 3 Encounters:  07/26/23 276 lb 12.8 oz (125.6 kg)  07/26/23 276 lb 6.4 oz (125.4 kg)  07/15/23 160 lb 15 oz (73 kg)    Physical Exam General appearance - alert, well appearing, and in no distress Mental status - normal mood, behavior, speech, dress, motor activity, and thought processes Chest - clear to auscultation, no wheezes, rales or rhonchi, symmetric air entry Heart - normal rate, regular rhythm, normal S1, S2, no murmurs, rubs, clicks or gallops Extremities - peripheral pulses normal, no pedal edema, no clubbing or cyanosis     07/26/2023    3:09 PM 07/26/2023    3:06 PM 01/05/2023    8:40 AM  Depression screen PHQ 2/9  Decreased Interest 2 3 0  Down, Depressed, Hopeless 1 1 0  PHQ - 2 Score 3 4 0  Altered sleeping 3 1   Tired, decreased energy 2 3   Feeling bad or failure about yourself  2 1   Trouble concentrating 1 1   Moving slowly or fidgety/restless 1    Suicidal thoughts 0 0   PHQ-9 Score 12 10       07/26/2023    3:10 PM 07/26/2023    3:07 PM 01/05/2023    8:40 AM 09/23/2022    3:39 PM  GAD 7 :  Generalized Anxiety Score  Nervous, Anxious, on Edge 2 0 0 3  Control/stop worrying 2 1 0 3  Worry too much - different things 2 1 0 3  Trouble relaxing 2 1 0 3  Restless 2 1 0 3  Easily annoyed or irritable 2 1 0 3  Afraid - awful might happen 2 1 0 2  Total GAD 7 Score 14 6 0 20  Anxiety Difficulty  Somewhat difficult  Very difficult         Latest Ref Rng & Units 05/18/2023    8:44 AM 02/25/2023   11:20 AM 01/05/2023    9:03 AM  CMP  Glucose 70 - 99 mg/dL 161  75  69   BUN 6 - 20 mg/dL 38  41  61   Creatinine 0.61 - 1.24 mg/dL 0.96  0.45  4.09   Sodium 135 - 145 mmol/L 136  138  139   Potassium 3.5 - 5.1 mmol/L 5.2  4.5  4.7   Chloride 98 - 111 mmol/L 99  105  100   CO2 22 - 32 mmol/L 28  25  22    Calcium 8.9 - 10.3 mg/dL  8.9  8.8  9.4   Total Protein 6.5 - 8.1 g/dL 7.0  7.2  7.0   Total Bilirubin <1.2 mg/dL 0.6  0.6  0.2   Alkaline Phos 38 - 126 U/L 76  88  91   AST 15 - 41 U/L 22  28  23    ALT 0 - 44 U/L 20  24  15     Lipid Panel     Component Value Date/Time   CHOL 155 02/25/2023 1120   CHOL 160 11/07/2019 0918   CHOL 157 10/21/2013 0435   TRIG 376 (H) 02/25/2023 1120   TRIG 117 10/21/2013 0435   HDL 27 (L) 02/25/2023 1120   HDL 40 11/07/2019 0918   HDL 30 (L) 10/21/2013 0435   CHOLHDL 5.7 02/25/2023 1120   VLDL 75 (H) 02/25/2023 1120   VLDL 23 10/21/2013 0435   LDLCALC 53 02/25/2023 1120   LDLCALC 92 11/07/2019 0918   LDLCALC 104 (H) 10/21/2013 0435    CBC    Component Value Date/Time   WBC 9.1 05/18/2023 0844   RBC 5.17 05/18/2023 0844   HGB 15.7 05/18/2023 0844   HGB 15.4 11/07/2019 0918   HCT 48.1 05/18/2023 0844   HCT 46.2 11/07/2019 0918   PLT 197 05/18/2023 0844   PLT 245 11/07/2019 0918   MCV 93.0 05/18/2023 0844   MCV 90 11/07/2019 0918   MCV 92 10/21/2013 0435   MCH 30.4 05/18/2023 0844   MCHC 32.6 05/18/2023 0844   RDW 15.0 05/18/2023 0844   RDW 14.2 11/07/2019 0918   RDW 15.7 (H) 10/21/2013 0435   LYMPHSABS 2.1 12/09/2022 0930   LYMPHSABS 1.1 10/21/2013 0435   MONOABS 0.8 12/09/2022 0930   MONOABS 0.6 10/21/2013 0435   EOSABS 0.1 12/09/2022 0930   EOSABS 0.0 10/21/2013 0435   BASOSABS 0.1 12/09/2022 0930   BASOSABS 0.0 10/21/2013 0435    ASSESSMENT AND PLAN: 1. Type 2 diabetes mellitus with obesity (HCC) (Primary) At goal.  Continue Farxiga.  Encourage healthy eating habits. - POCT glucose (manual entry) - POCT glycosylated hemoglobin (Hb A1C) - Ambulatory referral to Ophthalmology - Microalbumin / creatinine urine ratio  2. Acute gout, unspecified cause, unspecified site Flareup after he ran out of allopurinol.  Refill given.  Advised to keep the prednisone on hand and use  if he has another flare. - allopurinol (ZYLOPRIM) 100 MG tablet; TAKE 2 TABLETS  (200 MG TOTAL) BY MOUTH DAILY.  Dispense: 60 tablet; Refill: 6  3. Hypertension associated with diabetes (HCC) Not at goal due to being off his medications for several days.  Had thrown out his medicines and anger.  Refills sent. - carvedilol (COREG) 12.5 MG tablet; Take 1 tablet (12.5 mg total) by mouth 2 (two) times daily.  Dispense: 180 tablet; Refill: 3 - isosorbide-hydrALAZINE (BIDIL) 20-37.5 MG tablet; Take 1 tablet by mouth 3 (three) times daily.  Dispense: 270 tablet; Refill: 3  4. Chronic diastolic congestive heart failure (HCC) Stable and compensated.  Continue furosemide, Farxiga, BiDil - potassium chloride (KLOR-CON) 10 MEQ tablet; Take 2 tablets (20 mEq total) by mouth daily.  Dispense: 180 tablet; Refill: 2  5. Chronic obstructive pulmonary disease, unspecified COPD type (HCC) Stable.  Continue Advair and Spiriva.  Commended him on quitting smoking - fluticasone-salmeterol (ADVAIR HFA) 230-21 MCG/ACT inhaler; Inhale 2 puffs into the lungs 2 (two) times daily.  Dispense: 1 each; Refill: 12 - Tiotropium Bromide Monohydrate (SPIRIVA RESPIMAT) 2.5 MCG/ACT AERS; Inhale 2 puffs into the lungs daily.  Dispense: 4 g; Refill: 6 - albuterol (VENTOLIN HFA) 108 (90 Base) MCG/ACT inhaler; Inhale 2 puffs into the lungs every 6 (six) hours as needed for wheezing or shortness of breath.  Dispense: 54 g; Refill: 6 - dapagliflozin propanediol (FARXIGA) 10 MG TABS tablet; Take 1 tablet (10 mg total) by mouth daily.  Dispense: 90 tablet; Refill: 3  6. CKD stage 3b, GFR 30-44 ml/min (HCC) Stable.  Recommends that he call his nephrologist office and schedule his follow-up.  7. Former smoker Commended him on quitting.  Encouraged him to remain tobacco free.  8. Cocaine abuse (HCC) In remission x 6 months per patient.  Commended him on this and encouraged him to remain free of street drugs.  9. MDD (major depressive disorder), recurrent episode, moderate (HCC) Plugged in with behavioral health.   Reports he is doing well on his medications.  10. Prostate cancer screening Agreeable to prostate cancer screening. - PSA  11. Need for Streptococcus pneumoniae vaccination - Pneumococcal conjugate vaccine 20-valent  12. Encounter for screening involving social determinants of health (SDoH) - AMB Referral [facility] Care Management      Patient was given the opportunity to ask questions.  Patient verbalized understanding of the plan and was able to repeat key elements of the plan.   This documentation was completed using Paediatric nurse.  Any transcriptional errors are unintentional.  Orders Placed This Encounter  Procedures   Pneumococcal conjugate vaccine 20-valent   PSA   Microalbumin / creatinine urine ratio   AMB Referral [facility]BCI Care Management   Ambulatory referral to Ophthalmology   POCT glucose (manual entry)   POCT glycosylated hemoglobin (Hb A1C)     Requested Prescriptions   Signed Prescriptions Disp Refills   allopurinol (ZYLOPRIM) 100 MG tablet 60 tablet 6    Sig: TAKE 2 TABLETS (200 MG TOTAL) BY MOUTH DAILY.   potassium chloride (KLOR-CON) 10 MEQ tablet 180 tablet 2    Sig: Take 2 tablets (20 mEq total) by mouth daily.   Torsemide 40 MG TABS 90 tablet 1    Sig: Take 20 mg daily, alternating with 40 mg daily.   fluticasone-salmeterol (ADVAIR HFA) 230-21 MCG/ACT inhaler 1 each 12    Sig: Inhale 2 puffs into the lungs 2 (two) times daily.   carvedilol (COREG) 12.5 MG  tablet 180 tablet 3    Sig: Take 1 tablet (12.5 mg total) by mouth 2 (two) times daily.   isosorbide-hydrALAZINE (BIDIL) 20-37.5 MG tablet 270 tablet 3    Sig: Take 1 tablet by mouth 3 (three) times daily.   Tiotropium Bromide Monohydrate (SPIRIVA RESPIMAT) 2.5 MCG/ACT AERS 4 g 6    Sig: Inhale 2 puffs into the lungs daily.   albuterol (VENTOLIN HFA) 108 (90 Base) MCG/ACT inhaler 54 g 6    Sig: Inhale 2 puffs into the lungs every 6 (six) hours as needed for wheezing or shortness of  breath.   atorvastatin (LIPITOR) 80 MG tablet 90 tablet 1    Sig: Take 1 tablet (80 mg total) by mouth daily.   dapagliflozin propanediol (FARXIGA) 10 MG TABS tablet 90 tablet 3    Sig: Take 1 tablet (10 mg total) by mouth daily.    Return in about 4 months (around 11/23/2023).  Jonah Blue, MD, FACP
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
## 2023-07-26 NOTE — Patient Instructions (Addendum)
Medication Instructions:  You can take over the counter Mucinex for secretions. *If you need a refill on your cardiac medications before your next appointment, please call your pharmacy*  Lab Work: None ordered If you have labs (blood work) drawn today and your tests are completely normal, you will receive your results only by: MyChart Message (if you have MyChart) OR A paper copy in the mail If you have any lab test that is abnormal or we need to change your treatment, we will call you to review the results.  Follow-Up: At [facility], you and your health needs are our priority.  As part of our continuing mission to provide you with exceptional heart care, we have created designated Provider Care Teams.  These Care Teams include your primary Cardiologist (physician) and Advanced Practice Providers (APPs -  Physician Assistants and Nurse Practitioners) who all work together to provide you with the care you need, when you need it.  Your next appointment:   6 month(s)  Provider:   York Pellant, MD    [employer]  FDA-cleared EKG at your fingertips. - [employer]. [email or web address]   FDA-cleared, clinical grade mobile EKG monitor: Lourena Simmonds is the most clinically-validated mobile EKG used by the world's leading cardiac care medical professionals.  This may be useful in monitoring palpitations.  We do not have access to have them emailed and reviewed but will be glad to review while in the office.

## 2023-07-27 ENCOUNTER — Ambulatory Visit (INDEPENDENT_AMBULATORY_CARE_PROVIDER_SITE_OTHER): Payer: Medicaid Other | Admitting: Physician Assistant

## 2023-07-27 ENCOUNTER — Encounter: Payer: Self-pay | Admitting: Internal Medicine

## 2023-07-27 ENCOUNTER — Encounter: Payer: Self-pay | Admitting: Physician Assistant

## 2023-07-27 ENCOUNTER — Other Ambulatory Visit (INDEPENDENT_AMBULATORY_CARE_PROVIDER_SITE_OTHER): Payer: Medicaid Other

## 2023-07-27 DIAGNOSIS — G8929 Other chronic pain: Secondary | ICD-10-CM | POA: Diagnosis not present

## 2023-07-27 DIAGNOSIS — M25512 Pain in left shoulder: Secondary | ICD-10-CM | POA: Insufficient documentation

## 2023-07-27 DIAGNOSIS — F333 Major depressive disorder, recurrent, severe with psychotic symptoms: Secondary | ICD-10-CM | POA: Insufficient documentation

## 2023-07-27 NOTE — Progress Notes (Signed)
Office Visit Note   Patient: Chris [name]           Date of Birth: 1961/06/03           MRN: [id number] Visit Date: 07/27/2023              Requested by: [name], MD [address] [place],  [place] [address] PCP: [name], MD   Assessment & Plan: Visit Diagnoses:  1. Chronic left shoulder pain     Plan: Patient is a 63 year old gentleman that presents today with left shoulder pain and right hip pain.  Denies any injuries but did do quite a bit of weight lifting when he was younger.  X-rays of his shoulder today do show some degenerative changes.  He was very difficult to examine resisted raising his arm overhead had pain with both external rotation was able to hold his arm up to about 150 degrees.  Can internally rotate behind his back no neck pain.  We discussed doing an injection along with some physical therapy he was amenable to this.  Unfortunately he had to leave because he had an emergency and the family he can reschedule with me at any time He also has 5 recent hip x-ray moderate degenerative changes of his right hip.  Exam correlated with this.  I was going to recommend that in a couple weeks because he is diabetic that he could follow-up with Franky Macho for a intra-articular hip injection  Follow-Up Instructions: Return if symptoms worsen or fail to improve.   Orders:  Orders Placed This Encounter  Procedures   XR Shoulder Left   Ambulatory referral to Physical Therapy   No orders of the defined types were placed in this encounter.     Procedures: No procedures performed   Clinical Data: No additional findings.   Subjective: Chief Complaint  Patient presents with   Left Shoulder - Pain   Right Hip - Pain    HPI patient is a pleasant 63 year old gentleman who comes in today complaining of left shoulder pain and right hip pain.  No history of injury.  The left shoulder is more symptomatic than the right hip.  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    Objective: Vital Signs: There were no vitals taken for this visit.  Physical Exam Constitutional:      Appearance: Normal appearance.  Pulmonary:     Effort: Pulmonary effort is normal.  Skin:    General: Skin is warm and dry.  Neurological:     General: No focal deficit present.     Mental Status: He is alert and oriented to person, place, and time.  Psychiatric:        Mood and Affect: Mood normal.        Behavior: Behavior normal.     Ortho Exam Examination of his left shoulder is very hesitant to exam grip strength is intact.  He could actively raise his arm to about 90 degrees but then it just hurts too much.  I am able to get him to about 120 degrees he can briefly hold his arm up secondary to pain.  No bruising no erythema.  He does have pain with external rotation also pain with empty can testing cannot perform speeds testing can internally rotate behind his back to his  belt line Specialty Comments:  CLINICAL DATA:  Low back pain radiating to the left hip and ankle   EXAM: MRI LUMBAR SPINE WITHOUT CONTRAST   TECHNIQUE: Multiplanar, multisequence MR imaging of the lumbar spine was performed. No intravenous contrast was administered.   COMPARISON:  None Available.   FINDINGS: Segmentation:  Standard.   Alignment:  Physiologic.   Vertebrae:  No fracture, evidence of discitis, or bone lesion.   Conus medullaris and cauda equina: Conus extends to the L1 level. Conus and cauda equina appear normal.   Paraspinal and other soft tissues: Negative   Disc levels:   L1-L2: Normal disc space and facet joints. No spinal canal stenosis. No neural foraminal stenosis.   L2-L3: Normal disc space and facet joints. No spinal canal stenosis. No neural foraminal stenosis.   L3-L4: Intermediate sized disc bulge with mild facet  hypertrophy. No spinal canal stenosis. Mild left neural foraminal stenosis.   L4-L5: Large left asymmetric disc bulge with narrowing of the left lateral recess and mass effect on the left L5 nerve root. Mild facet hypertrophy. No central spinal canal stenosis. Severe left neural foraminal stenosis.   L5-S1: Mild facet hypertrophy. No spinal canal stenosis. Mild left neural foraminal stenosis.   Visualized sacrum: Normal.   IMPRESSION: 1. Large left asymmetric disc bulge at L4-L5 with narrowing of the left lateral recess and mass effect on the left L5 nerve root. Severe left neural foraminal stenosis. 2. Mild left L3-L4 and L5-S1 neural foraminal stenosis.     Electronically Signed   By: Deatra Robinson M.D.   On: 05/21/2023 02:40 --- IMPRESSION: 1. 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 2. 1.4 cm osteochondral lesion along the superomedial portion of the right (contralateral) femoral head. 3. Abnormal marrow edema medially along the posterior pubic bones bilaterally, without an obvious or large tear of the adjoining hip adductor aponeurosis. This could be from stress reaction or bone contusion. 4. Proximal hamstring tendinopathy bilaterally. Partial tearing of the contralateral (right) hamstring tendon proximally. 5. Amorphous accentuated signal in the left anterior labrum suspicious for a degenerative anterior superior labral tear. I do note that on the CT of 06/29/2022 there is chondrocalcinosis in the labrum, this can sometimes cause false-positive assessment for labral tear.     Electronically Signed   By: Gaylyn Rong M.D.   On: 05/21/2023 11:38  Imaging: XR Shoulder Left Result Date: 07/27/2023 Radiographs of his left shoulder do not demonstrate any dislocation mild degenerative changes minimal changes of the [facility] joint    PMFS History: Patient Active Problem List   Diagnosis Date Noted   Post traumatic stress disorder 09/23/2022   MDD (major  depressive disorder), recurrent episode, moderate (HCC) 08/26/2022   Stage 3b chronic kidney disease (HCC) 08/09/2022   Hypertrophic cardiomyopathy (HCC) 08/09/2022   History of renal cell cancer 08/09/2022   Chronic heart failure with preserved ejection fraction (HFpEF) (HCC) 07/13/2022   Agitation 04/23/2022   RSV (respiratory syncytial virus pneumonia) 04/22/2022   Obesity (BMI 30-39.9) 04/21/2022   COPD exacerbation (HCC) 04/19/2022   Dyslipidemia 04/19/2022   Gout 04/19/2022   Right kidney mass 05/14/2021   CHF exacerbation (HCC) 04/14/2021   Chest pain 04/14/2021   Syncope 04/14/2021   Left-sided weakness 10/28/2020   Typical atrial flutter (HCC)    CHF (congestive heart failure) (HCC) 07/04/2020   Acute exacerbation of CHF (congestive heart failure) (HCC) 06/16/2020   Influenza vaccine refused 05/06/2020   Acute decompensated heart failure (HCC) 05/04/2020  Illiteracy 05/04/2020   Type 2 diabetes mellitus with stage 3 chronic kidney disease (HCC) 12/25/2019   Elevated troponin I level 10/26/2019   History of gout 02/01/2019   Seasonal allergic rhinitis due to pollen 02/01/2019   Nicotine use disorder 11/30/2018   Microscopic hematuria 11/30/2018   Depression 11/30/2018   Difficulty controlling anger 11/30/2018   COPD (chronic obstructive pulmonary disease) (HCC)    CKD (chronic kidney disease) stage 3, GFR 30-59 ml/min (HCC) 08/10/2018   Recurrent epistaxis 04/21/2018   Mixed hyperlipidemia 07/28/2017   Essential hypertension 07/28/2017   Chronic systolic heart failure (HCC) 10/25/2014   Cocaine abuse (HCC) 02/20/2013   Cannabis abuse 02/20/2013   Back pain, chronic 02/20/2013   Past Medical History:  Diagnosis Date   Arrhythmia    atrial flutter   CHF (congestive heart failure) (HCC)    Chronic kidney disease    COPD (chronic obstructive pulmonary disease) (HCC)    Coronary artery disease    Depression    Diabetes mellitus without complication (HCC)    GERD  (gastroesophageal reflux disease)    Gout    Hypertension    Influenza A with respiratory manifestations    Mental disorder     Family History  Problem Relation Age of Onset   Heart disease Father    Diabetes Mother    HIV Brother    Healthy Son    Healthy Daughter     Past Surgical History:  Procedure Laterality Date   ANKLE SURGERY     CARDIAC CATHETERIZATION     CARDIOVERSION N/A 07/08/2020   Procedure: CARDIOVERSION;  Surgeon: Lamar Blinks, MD;  Location: [facility] ORS;  Service: Cardiovascular;  Laterality: N/A;   COLONOSCOPY WITH PROPOFOL N/A 11/19/2021   Procedure: COLONOSCOPY WITH PROPOFOL;  Surgeon: Jenel Lucks, MD;  Location: [facility];  Service: Gastroenterology;  Laterality: N/A;   HERNIA REPAIR     x2   IR RADIOLOGIST EVAL & MGMT  05/13/2022   IR RADIOLOGIST EVAL & MGMT  10/27/2022   IR RADIOLOGIST EVAL & MGMT  03/02/2023   POLYPECTOMY  11/19/2021   Procedure: POLYPECTOMY;  Surgeon: Jenel Lucks, MD;  Location: [facility];  Service: Gastroenterology;;   RADIOLOGY WITH ANESTHESIA N/A 06/23/2022   Procedure: Right renal tumor ablation;  Surgeon: Bennie Dallas, MD;  Location: [facility] OR;  Service: Radiology;  Laterality: N/A;   RIGHT HEART CATH N/A 07/13/2022   Procedure: RIGHT HEART CATH;  Surgeon: Laurey Morale, MD;  Location: [facility] INVASIVE CV LAB;  Service: Cardiovascular;  Laterality: N/A;   RIGHT/LEFT HEART CATH AND CORONARY ANGIOGRAPHY N/A 11/19/2022   Procedure: RIGHT/LEFT HEART CATH AND CORONARY ANGIOGRAPHY;  Surgeon: Laurey Morale, MD;  Location: [facility] INVASIVE CV LAB;  Service: Cardiovascular;  Laterality: N/A;   SHOULDER SURGERY     TEE WITHOUT CARDIOVERSION N/A 07/08/2020   Procedure: TRANSESOPHAGEAL ECHOCARDIOGRAM (TEE);  Surgeon: Lamar Blinks, MD;  Location: [facility] ORS;  Service: Cardiovascular;  Laterality: N/A;   Social History   Occupational History   Occupation: disability  Tobacco Use   Smoking status: Every Day    Current packs/day:  1.00    Average packs/day: 1 pack/day for 43.0 years (43.0 ttl pk-yrs)    Types: Cigarettes   Smokeless tobacco: Never   Tobacco comments:        6 cigs daily--03/28/2023  Vaping Use   Vaping status: Never Used  Substance and Sexual Activity   Alcohol use: Yes    Alcohol/week: 4.0  standard drinks of alcohol    Types: 4 Shots of liquor per week   Drug use: Yes    Frequency: 21.0 times per week    Types: Marijuana    Comment: last use Cocaine- 03/28/2021. Still using marijuana, last use 06/22/21   Sexual activity: Not on file

## 2023-07-28 ENCOUNTER — Encounter: Payer: Self-pay | Admitting: Internal Medicine

## 2023-07-28 ENCOUNTER — Ambulatory Visit: Payer: Self-pay | Admitting: Internal Medicine

## 2023-07-28 LAB — MICROALBUMIN / CREATININE URINE RATIO
Creatinine, Urine: 76.3 mg/dL
Microalb/Creat Ratio: 92 mg/g{creat} — ABNORMAL HIGH (ref 0–29)
Microalbumin, Urine: 70.3 ug/mL

## 2023-07-28 LAB — PSA: Prostate Specific Ag, Serum: 1 ng/mL (ref 0.0–4.0)

## 2023-07-28 NOTE — Telephone Encounter (Signed)
Chief Complaint: Immunization reaction Symptoms: chills, runny nose, nausea, watery diarrhea, dry cough, mild SOB Frequency: Ongoing since yesterday evening Pertinent Negatives: Patient denies fever Disposition: [] ED /[] Urgent Care (no appt availability in office) / [x] Appointment(In office/virtual)/ []  [facility]/ [] Home Care/ [] Refused Recommended Disposition /[]  Mobile Bus/ []  Follow-up with PCP Additional Notes: Pt reports he rcvd Pnumovax immunization on 02/11, notes symptoms began next evening. Pt reports diarrhea, chills, runny nose, dry cough, mild SOB. Pt denies fever, vomiting, CP, HA. OV scheduled for tomorrow AM. This RN educated pt on home care, new-worsening symptoms, when to call back/seek emergent care. Pt verbalized understanding and agrees to plan.   Reason for Disposition  Immunization needed, questions about  Answer Assessment - Initial Assessment Questions 1. SYMPTOMS: "What is the main symptom?" (e.g., redness, swelling, pain)      Chills, runny nose, 2. ONSET: "When was the vaccine (shot) given?" "How much later did the symptoms begin?" (e.g., hours, days ago)      07/25/22, 1 day 4. FEVER: "Is there a fever?" If Yes, ask: "What is it, how was it measured, and when did it start?"      None 5. IMMUNIZATIONS GIVEN: "What shots have you recently received?"     Pneumovax 6. PAST REACTIONS: "Have you reacted to immunizations before?" If Yes, ask: "What happened?"     None  Protocols used: Immunization Reactions-A-AH

## 2023-07-29 ENCOUNTER — Ambulatory Visit: Payer: Medicaid Other | Admitting: Physician Assistant

## 2023-07-29 ENCOUNTER — Ambulatory Visit (INDEPENDENT_AMBULATORY_CARE_PROVIDER_SITE_OTHER): Payer: Medicaid Other | Admitting: Family Medicine

## 2023-07-29 VITALS — BP 114/92 | HR 99 | Temp 98.5°F | Ht 72.0 in | Wt 273.4 lb

## 2023-07-29 DIAGNOSIS — M1611 Unilateral primary osteoarthritis, right hip: Secondary | ICD-10-CM | POA: Diagnosis not present

## 2023-07-29 DIAGNOSIS — R6883 Chills (without fever): Secondary | ICD-10-CM | POA: Diagnosis not present

## 2023-07-29 DIAGNOSIS — M25512 Pain in left shoulder: Secondary | ICD-10-CM | POA: Diagnosis not present

## 2023-07-29 LAB — POC INFLUENZA A&B (BINAX/QUICKVUE)
Influenza A, POC: NEGATIVE
Influenza B, POC: NEGATIVE

## 2023-07-29 LAB — POC COVID19 BINAXNOW: SARS Coronavirus 2 Ag: NEGATIVE

## 2023-07-29 MED ORDER — METHYLPREDNISOLONE ACETATE 40 MG/ML IJ SUSP
40.0000 mg | INTRAMUSCULAR | Status: AC | PRN
  Administered 2023-07-29: 40 mg via INTRA_ARTICULAR

## 2023-07-29 MED ORDER — LIDOCAINE HCL 1 % IJ SOLN
4.0000 mL | INTRAMUSCULAR | Status: AC | PRN
  Administered 2023-07-29: 4 mL

## 2023-07-29 NOTE — Progress Notes (Signed)
Patient ID: Chris [name], male    DOB: 12-14-1960, 63 y.o.   MRN: [id number]  This visit was conducted in person.  BP (!) 114/92 (BP Location: Left Arm, Patient Position: Sitting, Cuff Size: Large)   Pulse 99   Temp 98.5 F (36.9 C) (Temporal)   Ht 6' (1.829 m)   Wt 273 lb 6 oz (124 kg)   SpO2 95%   BMI 37.08 kg/m    CC:  Chief Complaint  Patient presents with   Vaccine Reaction    Prevnar 20 given 07/26/23 Right Arm C/O Sweats, Runny Nose,Cold Chills    Subjective:   HPI: Chris [name] is a 63 y.o. male patient of Dr. Jonah Blue with history of diabetes, COPD, CH, history of cocaine abuse F and gout presenting on 07/29/2023 for Vaccine Reaction (Prevnar 20 given 07/26/23 Right Arm/C/O Sweats, Runny Nose,Cold Chills)  Received Prevnar 20 on July 26, 2023 in his right arm.  Reviewed note from PCP in detail..  At that time he was not compliant with medication, he had thrown out his medication in anger.  PCP refilled his medication Coreg and BiDil.  He reports he noted On 2/12.[name] started chills, sweating, runny nose, body aches.   Has also noted cough,  clear and thick.   Stable SOB with COPD.  No tongue swelling, no rash, no itching.   He has taken tylenol.   Relevant past medical, surgical, family and social history reviewed and updated as indicated. Interim medical history since our last visit reviewed. Allergies and medications reviewed and updated. Outpatient Medications Prior to Visit  Medication Sig Dispense Refill   Accu-Chek Softclix Lancets lancets Use as instructed 100 each 12   acetaminophen (TYLENOL) 500 MG tablet Take 1-2 tablets (500-1,000 mg total) by mouth every 6 (six) hours as needed (pain.). 30 tablet 0   albuterol (PROVENTIL) (2.5 MG/3ML) 0.083% nebulizer solution Take 3 mLs (2.5 mg total) by nebulization every 4 (four) hours as needed for wheezing or shortness of breath. 300 mL 2   albuterol (VENTOLIN HFA) 108 (90 Base) MCG/ACT inhaler  Inhale 2 puffs into the lungs every 6 (six) hours as needed for wheezing or shortness of breath. 54 g 6   allopurinol (ZYLOPRIM) 100 MG tablet TAKE 2 TABLETS (200 MG TOTAL) BY MOUTH DAILY. 60 tablet 6   apixaban (ELIQUIS) 5 MG TABS tablet Take 1 tablet (5 mg total) by mouth 2 (two) times daily. 60 tablet 11   atorvastatin (LIPITOR) 80 MG tablet Take 1 tablet (80 mg total) by mouth daily. 90 tablet 1   Blood Glucose Monitoring Suppl (ACCU-CHEK AVIVA PLUS) w/Device KIT 1 each by Does not apply route 3 (three) times daily. 1 kit 0   carvedilol (COREG) 12.5 MG tablet Take 1 tablet (12.5 mg total) by mouth 2 (two) times daily. 180 tablet 3   dapagliflozin propanediol (FARXIGA) 10 MG TABS tablet Take 1 tablet (10 mg total) by mouth daily. 90 tablet 3   fluticasone-salmeterol (ADVAIR HFA) 230-21 MCG/ACT inhaler Inhale 2 puffs into the lungs 2 (two) times daily. 1 each 12   hydrOXYzine (ATARAX) 25 MG tablet Take 1 tablet 3 times a day as needed for anxiety. Take 2 tablets at night as need for sleep 270 tablet 0   isosorbide-hydrALAZINE (BIDIL) 20-37.5 MG tablet Take 1 tablet by mouth 3 (three) times daily. 270 tablet 3   lidocaine (LIDODERM) 5 % Place 1 patch onto the skin daily. Remove & Discard patch within  12 hours or as directed by MD 30 patch 0   mirtazapine (REMERON) 15 MG tablet Take 1 tablet (15 mg total) by mouth at bedtime. 90 tablet 0   oxyCODONE-acetaminophen (PERCOCET/ROXICET) 5-325 MG tablet Take 1 tablet by mouth every 4 (four) hours as needed for severe pain (pain score 7-10). 20 tablet 0   potassium chloride (KLOR-CON) 10 MEQ tablet Take 2 tablets (20 mEq total) by mouth daily. 180 tablet 2   predniSONE (DELTASONE) 20 MG tablet 1 tab PO daily x 5 days then 1/2 tab daily x 5 days 8 tablet 0   Tiotropium Bromide Monohydrate (SPIRIVA RESPIMAT) 2.5 MCG/ACT AERS Inhale 2 puffs into the lungs daily. 4 g 6   Torsemide 40 MG TABS Take 20 mg daily, alternating with 40 mg daily. 90 tablet 1    traZODone (DESYREL) 50 MG tablet Take 2 tablets (100 mg total) by mouth at bedtime. 180 tablet 0   No facility-administered medications prior to visit.     Per HPI unless specifically indicated in ROS section below Review of Systems  Constitutional:  Positive for chills and diaphoresis. Negative for fatigue and fever.  HENT:  Negative for ear pain.   Eyes:  Negative for pain.  Respiratory:  Negative for cough and shortness of breath.   Cardiovascular:  Negative for chest pain, palpitations and leg swelling.  Gastrointestinal:  Negative for abdominal pain.  Genitourinary:  Negative for dysuria.  Musculoskeletal:  Negative for arthralgias.  Neurological:  Negative for syncope, light-headedness and headaches.  Psychiatric/Behavioral:  Negative for dysphoric mood.    Objective:  BP (!) 114/92 (BP Location: Left Arm, Patient Position: Sitting, Cuff Size: Large)   Pulse 99   Temp 98.5 F (36.9 C) (Temporal)   Ht 6' (1.829 m)   Wt 273 lb 6 oz (124 kg)   SpO2 95%   BMI 37.08 kg/m   Wt Readings from Last 3 Encounters:  07/29/23 273 lb 6 oz (124 kg)  07/26/23 276 lb 12.8 oz (125.6 kg)  07/26/23 276 lb 6.4 oz (125.4 kg)      Physical Exam Vitals reviewed.  Constitutional:      Appearance: He is well-developed.  HENT:     Head: Normocephalic.     Right Ear: Hearing normal.     Left Ear: Hearing normal.     Nose: Congestion present.  Neck:     Thyroid: No thyroid mass or thyromegaly.     Vascular: No carotid bruit.     Trachea: Trachea normal.  Cardiovascular:     Rate and Rhythm: Normal rate and regular rhythm.     Pulses: Normal pulses.     Heart sounds: Heart sounds not distant. No murmur heard.    No friction rub. No gallop.     Comments: No peripheral edema Pulmonary:     Effort: Pulmonary effort is normal. No respiratory distress.     Breath sounds: Decreased breath sounds present.  Skin:    General: Skin is warm and dry.     Findings: No rash.  Psychiatric:         Speech: Speech normal.        Behavior: Behavior normal.        Thought Content: Thought content normal.       Results for orders placed or performed in visit on 07/26/23  POCT glucose (manual entry)   Collection Time: 07/26/23  3:08 PM  Result Value Ref Range   POC Glucose 104 (A) 70 -  99 mg/dl  POCT glycosylated hemoglobin (Hb A1C)   Collection Time: 07/26/23  3:12 PM  Result Value Ref Range   Hemoglobin A1C     HbA1c POC (<> result, manual entry)     HbA1c, POC (prediabetic range)     HbA1c, POC (controlled diabetic range) 6.6 0.0 - 7.0 %  PSA   Collection Time: 07/26/23  4:48 PM  Result Value Ref Range   Prostate Specific Ag, Serum 1.0 0.0 - 4.0 ng/mL  Microalbumin / creatinine urine ratio   Collection Time: 07/26/23  4:48 PM  Result Value Ref Range   Creatinine, Urine 76.3 Not Estab. mg/dL   Microalbumin, Urine 16.1 Not Estab. ug/mL   Microalb/Creat Ratio 92 (H) 0 - 29 mg/g creat    Assessment and Plan  Chills Assessment & Plan: Acute, possible vaccine reaction versus new viral infection. Negative test for flu and COVID. Symptoms seem to be improving, patient is without a fever today.  Recommended Tylenol for body aches and temperature. No sign of airway compromise, oral swelling or rash.  No clear indication for antihistamine or steroids.  Discussed with patient possible side effects from Prevnar likely but not contraindicating repeat Prevnar in the future if recommended given his chronic lung disease.  Orders: -     POC Influenza A&B(BINAX/QUICKVUE) -     POC COVID-19 BinaxNow    No follow-ups on file.   Kerby Nora, MD

## 2023-07-29 NOTE — Assessment & Plan Note (Signed)
Acute, possible vaccine reaction versus new viral infection. Negative test for flu and COVID. Symptoms seem to be improving, patient is without a fever today.  Recommended Tylenol for body aches and temperature. No sign of airway compromise, oral swelling or rash.  No clear indication for antihistamine or steroids.  Discussed with patient possible side effects from Prevnar likely but not contraindicating repeat Prevnar in the future if recommended given his chronic lung disease.

## 2023-07-29 NOTE — Progress Notes (Signed)
Office Visit Note   Patient: Chris [name]           Date of Birth: 21-Jun-1960           MRN: [id number] Visit Date: 07/29/2023              Requested by: [name], MD [address] [place],  [place] [address] PCP: [name], MD  Chief Complaint  Patient presents with  . Left Shoulder - Pain  . Right Hip - Pain      VZD:GLOVFIE is a pleasant 63 year old gentleman whom I saw in clinic a couple days ago. He was complaining of right hip groin pain and left shoulder pain. The left shoulder causing the most difficulties. After examining him found that he had both impingement as well as glenohumeral symptoms. Also had findings consistent with arthritis of his left hip. Was going to give him a subacromial injection and refer him to [facility] for a intra articular hip injection as well as a glenohumeral injection if the sub acromial injection didn't help but he had to leave as he had an emergency . He is back today for the subacromial injection   Assessment & Plan: Visit Diagnoses:  1. Left shoulder pain, unspecified chronicity   2. Arthritis of right hip     Plan: Went forward with left subacromial shoulder injection . Can follow up with [facility] for right hip possible left glenohumeral injection  Follow-Up Instructions: Return in about 2 weeks (around 08/12/2023) for right hip possible left glenohumeral injection.   Ortho Exam  Patient is alert, oriented, no adenopathy, well-dressed, normal affect, normal respiratory effort.   Imaging: No results found. N Examination of his left shoulder is very hesitant to exam grip strength is intact.  He could actively raise his arm to about 90 degrees but then it just hurts too much.  I am able to get him to about 120 degrees he can briefly hold his arm up secondary to pain.  No bruising no erythema.  He does have pain with external rotation also pain with empty can testing cannot perform speeds testing can internally  rotate behind his back to his belt line o images are attached to the encounter. Right hip. Pain in groin with log rolling. No radicular findings. Good strength  Labs: Lab Results  Component Value Date   HGBA1C 6.6 07/26/2023   HGBA1C 6.1 (H) 01/05/2023   HGBA1C 6.2 08/09/2022   LABURIC 9.1 (H) 01/14/2020   LABURIC 7.5 11/30/2018   REPTSTATUS 04/20/2022 FINAL 04/20/2022   REPTSTATUS 04/23/2022 FINAL 04/20/2022   GRAMSTAIN  04/20/2022    NO WBC SEEN RARE GRAM POSITIVE COCCI IN PAIRS RARE GRAM NEGATIVE RODS    CULT  04/20/2022    MODERATE Normal respiratory flora-no Staph aureus or Pseudomonas seen Performed at [facility], [address] [place], [place] [address]      Lab Results  Component Value Date   ALBUMIN 3.7 05/18/2023   ALBUMIN 3.7 02/25/2023   ALBUMIN 4.1 01/05/2023    Lab Results  Component Value Date   MG 2.0 02/11/2021   MG 2.5 (H) 07/09/2020   MG 2.4 07/08/2020   No results found for: "VD25OH"  No results found for: "PREALBUMIN"    Latest Ref Rng & Units 05/18/2023    8:44 AM 12/20/2022   10:07 AM 12/09/2022    9:30 AM  CBC EXTENDED  WBC 4.0 - 10.5 K/uL 9.1  9.8  9.4   RBC 4.22 - 5.81 MIL/uL 5.17  5.36  5.09   Hemoglobin 13.0 - 17.0 g/dL 62.1  30.8  65.7   HCT 39.0 - 52.0 % 48.1  50.3  46.0   Platelets 150 - 400 K/uL 197  171  166   NEUT# 1.7 - 7.7 K/uL   6.3   Lymph# 0.7 - 4.0 K/uL   2.1      There is no height or weight on file to calculate BMI.  Orders:  No orders of the defined types were placed in this encounter.  No orders of the defined types were placed in this encounter.    Procedures: Large Joint Inj: L subacromial bursa on 07/29/2023 11:20 AM Indications: diagnostic evaluation and pain Details: 25 G 1.5 in needle, posterior approach  Arthrogram: No  Medications: 4 mL lidocaine 1 %; 40 mg methylPREDNISolone acetate 40 MG/ML Outcome: tolerated well, no immediate complications Procedure, treatment alternatives, risks and  benefits explained, specific risks discussed. Consent was given by the patient.    Clinical Data: No additional findings.  ROS:  All other systems negative, except as noted in the HPI. Review of Systems  Objective: Vital Signs: There were no vitals taken for this visit.  Specialty Comments:  CLINICAL DATA:  Low back pain radiating to the left hip and ankle   EXAM: MRI LUMBAR SPINE WITHOUT CONTRAST   TECHNIQUE: Multiplanar, multisequence MR imaging of the lumbar spine was performed. No intravenous contrast was administered.   COMPARISON:  None Available.   FINDINGS: Segmentation:  Standard.   Alignment:  Physiologic.   Vertebrae:  No fracture, evidence of discitis, or bone lesion.   Conus medullaris and cauda equina: Conus extends to the L1 level. Conus and cauda equina appear normal.   Paraspinal and other soft tissues: Negative   Disc levels:   L1-L2: Normal disc space and facet joints. No spinal canal stenosis. No neural foraminal stenosis.   L2-L3: Normal disc space and facet joints. No spinal canal stenosis. No neural foraminal stenosis.   L3-L4: Intermediate sized disc bulge with mild facet hypertrophy. No spinal canal stenosis. Mild left neural foraminal stenosis.   L4-L5: Large left asymmetric disc bulge with narrowing of the left lateral recess and mass effect on the left L5 nerve root. Mild facet hypertrophy. No central spinal canal stenosis. Severe left neural foraminal stenosis.   L5-S1: Mild facet hypertrophy. No spinal canal stenosis. Mild left neural foraminal stenosis.   Visualized sacrum: Normal.   IMPRESSION: 1. Large left asymmetric disc bulge at L4-L5 with narrowing of the left lateral recess and mass effect on the left L5 nerve root. Severe left neural foraminal stenosis. 2. Mild left L3-L4 and L5-S1 neural foraminal stenosis.     Electronically Signed   By: Deatra Robinson M.D.   On: 05/21/2023 02:40 --- IMPRESSION: 1. Moderate  right and mild left hip osteoarthritis. 2. 1.4 cm osteochondral lesion along the superomedial portion of the right (contralateral) femoral head. 3. Abnormal marrow edema medially along the posterior pubic bones bilaterally, without an obvious or large tear of the adjoining hip adductor aponeurosis. This could be from stress reaction or bone contusion. 4. Proximal hamstring tendinopathy bilaterally. Partial tearing of the contralateral (right) hamstring tendon proximally. 5. Amorphous accentuated signal in the left anterior labrum suspicious for a degenerative anterior superior labral tear. I do note that on the CT of 06/29/2022 there is chondrocalcinosis in the labrum, this can sometimes cause false-positive assessment for labral  tear.     Electronically Signed   By: Gaylyn Rong M.D.   On: 05/21/2023 11:38  PMFS History: Patient Active Problem List   Diagnosis Date Noted  . Chills 07/29/2023  . Arthritis of right hip 07/29/2023  . Pain in left shoulder 07/27/2023  . Post traumatic stress disorder 09/23/2022  . MDD (major depressive disorder), recurrent episode, moderate (HCC) 08/26/2022  . Stage 3b chronic kidney disease (HCC) 08/09/2022  . Hypertrophic cardiomyopathy (HCC) 08/09/2022  . History of renal cell cancer 08/09/2022  . Chronic heart failure with preserved ejection fraction (HFpEF) (HCC) 07/13/2022  . Agitation 04/23/2022  . RSV (respiratory syncytial virus pneumonia) 04/22/2022  . Obesity (BMI 30-39.9) 04/21/2022  . COPD exacerbation (HCC) 04/19/2022  . Dyslipidemia 04/19/2022  . Gout 04/19/2022  . Right kidney mass 05/14/2021  . CHF exacerbation (HCC) 04/14/2021  . Chest pain 04/14/2021  . Syncope 04/14/2021  . Left-sided weakness 10/28/2020  . Typical atrial flutter (HCC)   . CHF (congestive heart failure) (HCC) 07/04/2020  . Acute exacerbation of CHF (congestive heart failure) (HCC) 06/16/2020  . Influenza vaccine refused 05/06/2020  . Acute  decompensated heart failure (HCC) 05/04/2020  . Illiteracy 05/04/2020  . Type 2 diabetes mellitus with stage 3 chronic kidney disease (HCC) 12/25/2019  . Elevated troponin I level 10/26/2019  . History of gout 02/01/2019  . Seasonal allergic rhinitis due to pollen 02/01/2019  . Nicotine use disorder 11/30/2018  . Microscopic hematuria 11/30/2018  . Depression 11/30/2018  . Difficulty controlling anger 11/30/2018  . COPD (chronic obstructive pulmonary disease) (HCC)   . CKD (chronic kidney disease) stage 3, GFR 30-59 ml/min (HCC) 08/10/2018  . Recurrent epistaxis 04/21/2018  . Mixed hyperlipidemia 07/28/2017  . Essential hypertension 07/28/2017  . Chronic systolic heart failure (HCC) 10/25/2014  . Cocaine abuse (HCC) 02/20/2013  . Cannabis abuse 02/20/2013  . Back pain, chronic 02/20/2013   Past Medical History:  Diagnosis Date  . Arrhythmia    atrial flutter  . CHF (congestive heart failure) (HCC)   . Chronic kidney disease   . COPD (chronic obstructive pulmonary disease) (HCC)   . Coronary artery disease   . Depression   . Diabetes mellitus without complication (HCC)   . GERD (gastroesophageal reflux disease)   . Gout   . Hypertension   . Influenza A with respiratory manifestations   . Mental disorder     Family History  Problem Relation Age of Onset  . Heart disease Father   . Diabetes Mother   . HIV Brother   . Healthy Son   . Healthy Daughter     Past Surgical History:  Procedure Laterality Date  . ANKLE SURGERY    . CARDIAC CATHETERIZATION    . CARDIOVERSION N/A 07/08/2020   Procedure: CARDIOVERSION;  Surgeon: Lamar Blinks, MD;  Location: [facility] ORS;  Service: Cardiovascular;  Laterality: N/A;  . COLONOSCOPY WITH PROPOFOL N/A 11/19/2021   Procedure: COLONOSCOPY WITH PROPOFOL;  Surgeon: Jenel Lucks, MD;  Location: [facility] END[facility]COPY;  Service: Gastroenterology;  Laterality: N/A;  . HERNIA REPAIR     x2  . IR RADIOLOGIST EVAL & MGMT  05/13/2022  . IR  RADIOLOGIST EVAL & MGMT  10/27/2022  . IR RADIOLOGIST EVAL & MGMT  03/02/2023  . POLYPECTOMY  11/19/2021   Procedure: POLYPECTOMY;  Surgeon: Jenel Lucks, MD;  Location: [facility];  Service: Gastroenterology;;  . RADIOLOGY WITH ANESTHESIA N/A 06/23/2022   Procedure: Right renal tumor ablation;  Surgeon: Bennie Dallas,  MD;  Location: [facility] OR;  Service: Radiology;  Laterality: N/A;  . RIGHT HEART CATH N/A 07/13/2022   Procedure: RIGHT HEART CATH;  Surgeon: Laurey Morale, MD;  Location: [facility] INVASIVE CV LAB;  Service: Cardiovascular;  Laterality: N/A;  . RIGHT/LEFT HEART CATH AND CORONARY ANGIOGRAPHY N/A 11/19/2022   Procedure: RIGHT/LEFT HEART CATH AND CORONARY ANGIOGRAPHY;  Surgeon: Laurey Morale, MD;  Location: [facility] INVASIVE CV LAB;  Service: Cardiovascular;  Laterality: N/A;  . SHOULDER SURGERY    . TEE WITHOUT CARDIOVERSION N/A 07/08/2020   Procedure: TRANSESOPHAGEAL ECHOCARDIOGRAM (TEE);  Surgeon: Lamar Blinks, MD;  Location: [facility] ORS;  Service: Cardiovascular;  Laterality: N/A;   Social History   Occupational History  . Occupation: disability  Tobacco Use  . Smoking status: Every Day    Current packs/day: 1.00    Average packs/day: 1 pack/day for 43.0 years (43.0 ttl pk-yrs)    Types: Cigarettes  . Smokeless tobacco: Never  . Tobacco comments:        6 cigs daily--03/28/2023  Vaping Use  . Vaping status: Never Used  Substance and Sexual Activity  . Alcohol use: Yes    Alcohol/week: 4.0 standard drinks of alcohol    Types: 4 Shots of liquor per week  . Drug use: Yes    Frequency: 21.0 times per week    Types: Marijuana    Comment: last use Cocaine- 03/28/2021. Still using marijuana, last use 06/22/21  . Sexual activity: Not on file

## 2023-08-02 ENCOUNTER — Ambulatory Visit (HOSPITAL_COMMUNITY): Payer: Medicaid Other | Admitting: Mental Health

## 2023-08-02 ENCOUNTER — Other Ambulatory Visit: Payer: Self-pay

## 2023-08-02 NOTE — Patient Instructions (Signed)
  Medicaid Managed Care   Unsuccessful Outreach Note  08/02/2023 Name: VAHAN WADSWORTH MRN: [id number] DOB: [birth year]  Referred by: [name], MD Reason for referral : High Risk Managed Medicaid (MM social work unsuccessful telephone outreach )   A second unsuccessful telephone outreach was attempted today. The patient was referred to the case management team for assistance with care management and care coordination.   Follow Up Plan: A HIPAA compliant phone message was left for the patient providing contact information and requesting a return call.   Abelino Derrick, MHA [facility]  Managed [facility] Social Worker [phone]

## 2023-08-02 NOTE — Patient Outreach (Signed)
  Medicaid Managed Care   Unsuccessful Outreach Note  08/02/2023 Name: Chris [name] MRN: [id number] DOB: [birth year]  Referred by: [name], MD Reason for referral : High Risk Managed Medicaid (MM social work unsuccessful telephone outreach )   A second unsuccessful telephone outreach was attempted today. The patient was referred to the case management team for assistance with care management and care coordination.   Follow Up Plan: A HIPAA compliant phone message was left for the patient providing contact information and requesting a return call.   Abelino Derrick, MHA [facility]  Managed [facility] Social Worker [phone]

## 2023-08-04 ENCOUNTER — Encounter (HOSPITAL_COMMUNITY): Payer: Self-pay

## 2023-08-04 ENCOUNTER — Ambulatory Visit (HOSPITAL_COMMUNITY): Payer: Medicaid Other
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
## 2023-08-07 NOTE — Progress Notes (Unsigned)
 BH MD Outpatient Progress Note  08/09/2023 1:02 PM Chris [name]  MRN:  [id number]  Assessment:  Patient reports improved mood and anxiety since improvement in sleep. He has stopped taking trazodone but continues to sleep well with hydroxyzine. He did not follow up in regards to his CPAP and OSA but stated he'd follow up with PCP. He has been taking mirtazapine 15 mg every other day, but we discussed needing daily use in order to address depression or anxiety. Should he like to use purely for sedation, we can opt for a low dose of it PRN. Patient to follow up in 3 months to evaluate for progress.  He has been seeing Remigio Eisenmenger for substance use counseling which we will discuss next visit and assess if need for naltrexone or further adjustments to medications at that point.  Identifying Information: Chris [name] is a 63 y.o. y.o. male with a history of depression, hypertension, obesity, HFrEF (initial EF 25%, normalized to 50%) atrial flutter s/p DCCV 06/2020, COPD, current smoker x40+ years, former cocaine use x25+ years, alcohol use disorder, cannabis use disorder, HCM, CKD who is an established patient with [facility] for medication management.   Plan: # Major Depressive Disorder-recurrent episode, moderate Past medication trials:  Status of problem: remission Interventions: -- Decrease mirtazapine to 7.5 mg at bedtime for depression, insomnia, and appetite stimulation -- Continue hydroxyzine 25 mg tid prn for anxiety -- Continue hydroxyzine 50 mg at bedtime as needed for sleep -- Stop trazodone 100 mg nightly for sleep  Alcohol Use -Continue to monitor and advised cessation  #Nicotine use disorder #Cannabis use disorder --Encourage cessation   Patient was given contact information for behavioral health clinic and was instructed to call 911 for emergencies.   Subjective:  Chief Complaint:  Medication management  Interval History:  Patient reports  depression and anxiety has been in remission since last visit.  He feels that he has been eating well and sleeping well.  He has stopped taking trazodone and does not feel any major changes to his sleep.  He reports appetite is fair.  He reports he has been doing more outdoor activities which has improved his mood as well as provided him some distraction from internal rumination as well as isolation.  He has been taking mirtazapine as needed approximately every other day but takes hydroxyzine every night as well as as needed for anxiety.  All questions were addressed   Visit Diagnosis:    ICD-10-CM   1. MDD (major depressive disorder), recurrent episode, moderate (HCC)  F33.1 mirtazapine (REMERON) 15 MG tablet    hydrOXYzine (ATARAX) 25 MG tablet    2. Post traumatic stress disorder  F43.10 mirtazapine (REMERON) 15 MG tablet         Past Psychiatric History:  Diagnoses: none Medication trials: duloxetine Previous psychiatrist/therapist: none Hospitalizations: none Suicide attempts: denies SIB: denies Hx of violence towards others: denies Current access to guns: denies  Past Medical History:  Past Medical History:  Diagnosis Date   Arrhythmia    atrial flutter   CHF (congestive heart failure) (HCC)    Chronic kidney disease    COPD (chronic obstructive pulmonary disease) (HCC)    Coronary artery disease    Depression    Diabetes mellitus without complication (HCC)    GERD (gastroesophageal reflux disease)    Gout    Hypertension    Influenza A with respiratory manifestations    Mental disorder     Past Surgical  History:  Procedure Laterality Date   ANKLE SURGERY     CARDIAC CATHETERIZATION     CARDIOVERSION N/A 07/08/2020   Procedure: CARDIOVERSION;  Surgeon: Lamar Blinks, MD;  Location: [facility] ORS;  Service: Cardiovascular;  Laterality: N/A;   COLONOSCOPY WITH PROPOFOL N/A 11/19/2021   Procedure: COLONOSCOPY WITH PROPOFOL;  Surgeon: Jenel Lucks, MD;  Location:  [facility];  Service: Gastroenterology;  Laterality: N/A;   HERNIA REPAIR     x2   IR RADIOLOGIST EVAL & MGMT  05/13/2022   IR RADIOLOGIST EVAL & MGMT  10/27/2022   IR RADIOLOGIST EVAL & MGMT  03/02/2023   POLYPECTOMY  11/19/2021   Procedure: POLYPECTOMY;  Surgeon: Jenel Lucks, MD;  Location: [facility];  Service: Gastroenterology;;   RADIOLOGY WITH ANESTHESIA N/A 06/23/2022   Procedure: Right renal tumor ablation;  Surgeon: Bennie Dallas, MD;  Location[facility] OR;  Service: Radiology;  Laterality: N/A;   RIGHT HEART CATH N/A 07/13/2022   Procedure: RIGHT HEART CATH;  Surgeon: Laurey Morale, MD;  Location: [facility] INVASIVE CV LAB;  Service: Cardiovascular;  Laterality: N/A;   RIGHT/LEFT HEART CATH AND CORONARY ANGIOGRAPHY N/A 11/19/2022   Procedure: RIGHT/LEFT HEART CATH AND CORONARY ANGIOGRAPHY;  Surgeon: Laurey Morale, MD;  Location: [facility] INVASIVE CV LAB;  Service: Cardiovascular;  Laterality: N/A;   SHOULDER SURGERY     TEE WITHOUT CARDIOVERSION N/A 07/08/2020   Procedure: TRANSESOPHAGEAL ECHOCARDIOGRAM (TEE);  Surgeon: Lamar Blinks, MD;  Location: [facility] ORS;  Service: Cardiovascular;  Laterality: N/A;    Family History:  Family History  Problem Relation Age of Onset   Heart disease Father    Diabetes Mother    HIV Brother    Healthy Son    Healthy Daughter     Social History:  Social History   Socioeconomic History   Marital status: Divorced    Spouse name: Not on file   Number of children: 3   Years of education: Not on file   Highest education level: High school graduate  Occupational History   Occupation: disability  Tobacco Use   Smoking status: Every Day    Current packs/day: 1.00    Average packs/day: 1 pack/day for 43.0 years (43.0 ttl pk-yrs)    Types: Cigarettes   Smokeless tobacco: Never   Tobacco comments:        6 cigs daily--03/28/2023  Vaping Use   Vaping status: Never Used  Substance and Sexual Activity   Alcohol use: Yes    Alcohol/week:  4.0 standard drinks of alcohol    Types: 4 Shots of liquor per week   Drug use: Yes    Frequency: 21.0 times per week    Types: Marijuana    Comment: last use Cocaine- 03/28/2021. Still using marijuana, last use 06/22/21   Sexual activity: Not on file  Other Topics Concern   Not on file  Social History Narrative   ** Merged History Encounter **       Social Drivers of Health   Financial Resource Strain: High Risk (07/26/2023)   Overall Financial Resource Strain (CARDIA)    Difficulty of Paying Living Expenses: Very hard  Food Insecurity: Food Insecurity Present (07/26/2023)   Hunger Vital Sign    Worried About Running Out of Food in the Last Year: Sometimes true    Ran Out of Food in the Last Year: Sometimes true  Transportation Needs: Unmet Transportation Needs (07/26/2023)   PRAPARE - Administrator, Civil Service (  Medical): Yes    Lack of Transportation (Non-Medical): Yes  Physical Activity: Insufficiently Active (11/16/2021)   Exercise Vital Sign    Days of Exercise per Week: 5 days    Minutes of Exercise per Session: 20 min  Stress: Stress Concern Present (07/26/2023)   [employer] of Occupational Health - Occupational Stress Questionnaire    Feeling of Stress : Very much  Social Connections: Moderately Integrated (07/26/2023)   Social Connection and Isolation Panel [NHANES]    Frequency of Communication with Friends and Family: More than three times a week    Frequency of Social Gatherings with Friends and Family: Once a week    Attends Religious Services: 1 to 4 times per year    Active Member of [employer] or Organizations: Yes    Attends Banker Meetings: Never    Marital Status: Divorced    Allergies: No Known Allergies  Current Medications: Current Outpatient Medications  Medication Sig Dispense Refill   Accu-Chek Softclix Lancets lancets Use as instructed 100 each 12   acetaminophen (TYLENOL) 500 MG tablet Take 1-2 tablets (500-1,000 mg  total) by mouth every 6 (six) hours as needed (pain.). 30 tablet 0   albuterol (PROVENTIL) (2.5 MG/3ML) 0.083% nebulizer solution Take 3 mLs (2.5 mg total) by nebulization every 4 (four) hours as needed for wheezing or shortness of breath. 300 mL 2   albuterol (VENTOLIN HFA) 108 (90 Base) MCG/ACT inhaler Inhale 2 puffs into the lungs every 6 (six) hours as needed for wheezing or shortness of breath. 54 g 6   allopurinol (ZYLOPRIM) 100 MG tablet TAKE 2 TABLETS (200 MG TOTAL) BY MOUTH DAILY. 60 tablet 6   apixaban (ELIQUIS) 5 MG TABS tablet Take 1 tablet (5 mg total) by mouth 2 (two) times daily. 60 tablet 11   atorvastatin (LIPITOR) 80 MG tablet Take 1 tablet (80 mg total) by mouth daily. 90 tablet 1   Blood Glucose Monitoring Suppl (ACCU-CHEK AVIVA PLUS) w/Device KIT 1 each by Does not apply route 3 (three) times daily. 1 kit 0   carvedilol (COREG) 12.5 MG tablet Take 1 tablet (12.5 mg total) by mouth 2 (two) times daily. 180 tablet 3   dapagliflozin propanediol (FARXIGA) 10 MG TABS tablet Take 1 tablet (10 mg total) by mouth daily. 90 tablet 3   fluticasone-salmeterol (ADVAIR HFA) 230-21 MCG/ACT inhaler Inhale 2 puffs into the lungs 2 (two) times daily. 1 each 12   [START ON 09/06/2023] hydrOXYzine (ATARAX) 25 MG tablet Take 1 tablet 3 times a day as needed for anxiety. Take 2 tablets at night as need for sleep 270 tablet 0   isosorbide-hydrALAZINE (BIDIL) 20-37.5 MG tablet Take 1 tablet by mouth 3 (three) times daily. 270 tablet 3   lidocaine (LIDODERM) 5 % Place 1 patch onto the skin daily. Remove & Discard patch within 12 hours or as directed by MD 30 patch 0   [START ON 09/06/2023] mirtazapine (REMERON) 15 MG tablet Take 0.5 tablets (7.5 mg total) by mouth at bedtime. 45 tablet 0   oxyCODONE-acetaminophen (PERCOCET/ROXICET) 5-325 MG tablet Take 1 tablet by mouth every 4 (four) hours as needed for severe pain (pain score 7-10). 20 tablet 0   potassium chloride (KLOR-CON) 10 MEQ tablet Take 2 tablets  (20 mEq total) by mouth daily. 180 tablet 2   predniSONE (DELTASONE) 20 MG tablet 1 tab PO daily x 5 days then 1/2 tab daily x 5 days 8 tablet 0   Tiotropium Bromide Monohydrate (SPIRIVA RESPIMAT) 2.5  MCG/ACT AERS Inhale 2 puffs into the lungs daily. 4 g 6   Torsemide 40 MG TABS Take 20 mg daily, alternating with 40 mg daily. 90 tablet 1   No current facility-administered medications for this visit.    ROS:   Objective:  Psychiatric Specialty Exam: There were no vitals taken for this visit.There is no height or weight on file to calculate BMI.  General Appearance: Casual  Eye Contact:  Good  Speech:  Clear and Coherent  Volume:  Normal  Mood:  Anxious and Depressed  Affect:  Appropriate and Constricted  Thought Process:  Coherent, Goal Directed, and Linear  Orientation:  Full (Time, Place, and Person)  Thought Content: Logical   Suicidal Thoughts:  No  Homicidal Thoughts:  No  Memory:  Negative  Judgment:  Intact  Insight:  Fair  Psychomotor Activity:  Normal  Concentration:  Concentration: Fair              Assets:  Communication Skills Desire for Improvement Financial Resources/Insurance Housing  ADL's:  Intact  Cognition: WNL  Sleep:  Poor   PE: General: fatigued, fairly groomed Pulm: no increased work of breathing on room air  Strength & Muscle Tone: within normal limits Neuro: no focal neurological deficits observed  Gait & Station: slight limp when walking  Metabolic Disorder Labs: Lab Results  Component Value Date   HGBA1C 6.6 07/26/2023   MPG 131.24 04/20/2022   MPG 151.33 02/09/2021   No results found for: "PROLACTIN" Lab Results  Component Value Date   CHOL 155 02/25/2023   TRIG 376 (H) 02/25/2023   HDL 27 (L) 02/25/2023   CHOLHDL 5.7 02/25/2023   VLDL 75 (H) 02/25/2023   LDLCALC 53 02/25/2023   LDLCALC 125 (H) 12/22/2022   Lab Results  Component Value Date   TSH 1.592 10/26/2019   TSH 0.842 11/30/2018    Therapeutic Level  Labs: No results found for: "LITHIUM" No results found for: "VALPROATE" No results found for: "CBMZ"  Screenings: AUDIT    Flowsheet Row [facility]  Alcohol Use Disorder Identification Test Final Score (AUDIT) 1      GAD-7    Flowsheet Row Office Visit from 07/26/2023 in [facility] [place] [facility] [place] Most recent reading at 07/26/2023  3:10 PM Counselor from 07/26/2023 in [facility] Most recent reading at 07/26/2023  3:07 PM Office Visit from 01/05/2023 in [facility] Most recent reading at 01/05/2023  8:40 AM Counselor from 09/23/2022 in [facility] Most recent reading at 09/23/2022  3:39 PM Office Visit from 08/09/2022 in [facility] [place]. [facility] Most recent reading at 08/09/2022  9:54 AM  Total GAD-7 Score 14 6 0 20 15      PHQ2-9    Flowsheet Row Office Visit from 07/26/2023 in [facility] [place] [facility] [place]. [facility] Most recent reading at 07/26/2023  3:09 PM Counselor from 07/26/2023 in [facility] Most recent reading at 07/26/2023  3:06 PM Office Visit from 01/05/2023 in [facility] Most recent reading at 01/05/2023  8:40 AM Counselor from 09/23/2022 in [facility] Most recent reading at 09/23/2022  3:40 PM Office Visit from 08/26/2022 in [facility] Most recent reading  at 08/26/2022  1:38 PM  PHQ-2 Total Score 3 4 0 6 5  PHQ-9 Total Score 12 10 -- 21 17      Flowsheet Row ED from 06/16/2023 in [facility] Department at [facility] ED from 05/18/2023 in [facility] Emergency Department at [facility] ED from 03/25/2023 in [facility] Emergency Department at [facility]  C-SSRS RISK CATEGORY No Risk No Risk No Risk       Collaboration of Care: Collaboration of Care:   Patient/Guardian was advised Release of Information must be obtained prior to any record release in order to collaborate their care with an outside provider. Patient/Guardian was advised if they have not already done so to contact the registration department to sign all necessary forms in order for [place] to release information regarding their care.   Consent: Patient/Guardian gives verbal consent for treatment and assignment of benefits for services provided during this visit. Patient/Guardian expressed understanding and agreed to proceed.   A total of 30 minutes was spent involved in face to face clinical care, chart review, and documentation.   Park Pope, MD 08/10/2023, 1:02 PM

## 2023-08-08 ENCOUNTER — Ambulatory Visit: Payer: Medicaid Other | Attending: Physician Assistant | Admitting: Physical Therapy

## 2023-08-08 NOTE — Therapy (Incomplete)
OUTPATIENT PHYSICAL THERAPY UPPER EXTREMITY EVALUATION   Patient Name: Chris [name] MRN: [id number] DOB:[birth year], 63 y.o., male Today's Date: 08/08/2023  END OF SESSION:   Past Medical History:  Diagnosis Date   Arrhythmia    atrial flutter   CHF (congestive heart failure) (HCC)    Chronic kidney disease    COPD (chronic obstructive pulmonary disease) (HCC)    Coronary artery disease    Depression    Diabetes mellitus without complication (HCC)    GERD (gastroesophageal reflux disease)    Gout    Hypertension    Influenza A with respiratory manifestations    Mental disorder    Past Surgical History:  Procedure Laterality Date   ANKLE SURGERY     CARDIAC CATHETERIZATION     CARDIOVERSION N/A 07/08/2020   Procedure: CARDIOVERSION;  Surgeon: Lamar Blinks, MD;  Location: [facility];  Service: Cardiovascular;  Laterality: N/A;   COLONOSCOPY WITH PROPOFOL N/A 11/19/2021   Procedure: COLONOSCOPY WITH PROPOFOL;  Surgeon: Jenel Lucks, MD;  Location: [facility];  Service: Gastroenterology;  Laterality: N/A;   HERNIA REPAIR     x2   IR RADIOLOGIST EVAL & MGMT  05/13/2022   IR RADIOLOGIST EVAL & MGMT  10/27/2022   IR RADIOLOGIST EVAL & MGMT  03/02/2023   POLYPECTOMY  11/19/2021   Procedure: POLYPECTOMY;  Surgeon: Jenel Lucks, MD;  Location: [facility];  Service: Gastroenterology;;   RADIOLOGY WITH ANESTHESIA N/A 06/23/2022   Procedure: Right renal tumor ablation;  Surgeon: Bennie Dallas, MD;  Location: [facility] OR;  Service: Radiology;  Laterality: N/A;   RIGHT HEART CATH N/A 07/13/2022   Procedure: RIGHT HEART CATH;  Surgeon: Laurey Morale, MD;  Location: [facility] INVASIVE CV LAB;  Service: Cardiovascular;  Laterality: N/A;   RIGHT/LEFT HEART CATH AND CORONARY ANGIOGRAPHY N/A 11/19/2022   Procedure: RIGHT/LEFT HEART CATH AND CORONARY ANGIOGRAPHY;  Surgeon: Laurey Morale, MD;  Location: [facility] CV LAB;  Service: Cardiovascular;  Laterality: N/A;   SHOULDER SURGERY      TEE WITHOUT CARDIOVERSION N/A 07/08/2020   Procedure: TRANSESOPHAGEAL ECHOCARDIOGRAM (TEE);  Surgeon: Lamar Blinks, MD;  Location: [facility];  Service: Cardiovascular;  Laterality: N/A;   Patient Active Problem List   Diagnosis Date Noted   Chills 07/29/2023   Arthritis of right hip 07/29/2023   Pain in left shoulder 07/27/2023   Post traumatic stress disorder 09/23/2022   MDD (major depressive disorder), recurrent episode, moderate (HCC) 08/26/2022   Stage 3b chronic kidney disease (HCC) 08/09/2022   Hypertrophic cardiomyopathy (HCC) 08/09/2022   History of renal cell cancer 08/09/2022   Chronic heart failure with preserved ejection fraction (HFpEF) (HCC) 07/13/2022   Agitation 04/23/2022   RSV (respiratory syncytial virus pneumonia) 04/22/2022   Obesity (BMI 30-39.9) 04/21/2022   COPD exacerbation (HCC) 04/19/2022   Dyslipidemia 04/19/2022   Gout 04/19/2022   Right kidney mass 05/14/2021   CHF exacerbation (HCC) 04/14/2021   Chest pain 04/14/2021   Syncope 04/14/2021   Left-sided weakness 10/28/2020   Typical atrial flutter (HCC)    CHF (congestive heart failure) (HCC) 07/04/2020   Acute exacerbation of CHF (congestive heart failure) (HCC) 06/16/2020   Influenza vaccine refused 05/06/2020   Acute decompensated heart failure (HCC) 05/04/2020   Illiteracy 05/04/2020   Type 2 diabetes mellitus with stage 3 chronic kidney disease (HCC) 12/25/2019   Elevated troponin I level 10/26/2019   History of gout 02/01/2019   Seasonal allergic rhinitis due to pollen 02/01/2019  Nicotine use disorder 11/30/2018   Microscopic hematuria 11/30/2018   Depression 11/30/2018   Difficulty controlling anger 11/30/2018   COPD (chronic obstructive pulmonary disease) (HCC)    CKD (chronic kidney disease) stage 3, GFR 30-59 ml/min (HCC) 08/10/2018   Recurrent epistaxis 04/21/2018   Mixed hyperlipidemia 07/28/2017   Essential hypertension 07/28/2017   Chronic systolic heart failure (HCC)  16/03/9603   Cocaine abuse (HCC) 02/20/2013   Cannabis abuse 02/20/2013   Back pain, chronic 02/20/2013    PCP: [name], MD  REFERRING PROVIDER: Persons, [place], [place]  REFERRING DIAG: Chronic left shoulder pain [M25.512, G89.29]   THERAPY DIAG:  No diagnosis found.  Rationale for Evaluation and Treatment: Rehabilitation  ONSET DATE: ***  SUBJECTIVE:                                                                                                                                                                                      SUBJECTIVE STATEMENT: Eval statement 08/08/2023: *** Hand dominance: {MISC; OT HAND DOMINANCE:[phone]}  PERTINENT HISTORY: CHF, COPD, DM2, CKD3,  PAIN:  Are you having pain? Yes: NPRS scale: *** Pain location: *** Pain description: *** Aggravating factors: *** Relieving factors: ***  PRECAUTIONS: {Therapy precautions:24002}  RED FLAGS: {PT Red Flags:29287}   WEIGHT BEARING RESTRICTIONS: {Yes ***/No:24003}  FALLS:  Has patient fallen in last 6 months? {fallsyesno:27318}  OCCUPATION: ***  PLOF: {PLOF:24004}  PATIENT GOALS: ***  NEXT MD VISIT: ***  OBJECTIVE:  Note: Objective measures were completed at Evaluation unless otherwise noted.  DIAGNOSTIC FINDINGS:  ***  PATIENT SURVEYS :  Quick Dash ***  COGNITION: Overall cognitive status: {cognition:24006}     SENSATION: {sensation:27233}  POSTURE: ***  UPPER EXTREMITY ROM:   {AROM/PROM:27142} ROM Right eval Left eval  Shoulder flexion    Shoulder extension    Shoulder abduction    Shoulder adduction    Shoulder internal rotation    Shoulder external rotation    Elbow flexion    Elbow extension    Wrist flexion    Wrist extension    Wrist ulnar deviation    Wrist radial deviation    Wrist pronation    Wrist supination    (Blank rows = not tested)  UPPER EXTREMITY MMT:  MMT Right eval Left eval  Shoulder flexion    Shoulder extension     Shoulder abduction    Shoulder adduction    Shoulder internal rotation    Shoulder external rotation    Middle trapezius    Lower trapezius    Elbow flexion    Elbow extension    Wrist flexion    Wrist extension  Wrist ulnar deviation    Wrist radial deviation    Wrist pronation    Wrist supination    Grip strength (lbs)    (Blank rows = not tested)  SHOULDER SPECIAL TESTS: Impingement tests: {shoulder impingement test:25231:a} SLAP lesions: {SLAP lesions:25232} Instability tests: {shoulder instability test:25233} Rotator cuff assessment: {rotator cuff assessment:25234} Biceps assessment: {biceps assessment:25235}  JOINT MOBILITY TESTING:  ***  PALPATION:  ***                                             [facility] Adult PT Treatment:                                                DATE: 08/08/2023  Therapeutic Exercise: *** Manual Therapy: *** Neuromuscular re-ed: *** Therapeutic Activity: *** Modalities: *** Self Care: ***   PATIENT EDUCATION: Education details: Pt received education regarding HEP performance, ADL performance, functional activity tolerance, impairment education, appropriate performance of therapeutic activities. Person educated: {Person educated:25204} Education method: {Education Method:25205} Education comprehension: {Education Comprehension:25206}  HOME EXERCISE PROGRAM: ***  ASSESSMENT:  CLINICAL IMPRESSION: Eval impression (08/08/2023): Pt. attended today's physical therapy session for evaluation of ***. Pt has complaints of ***. Pt has notable deficits with ***.  Signs and symptoms are concurrent with ***. Pt would benefit from therapeutic focus on ***.  Treatment performed today focused on *** Pt demonstrated *** understanding of education provided. required *** cues and *** assistance for appropriate performance with today's activities.  Pt requires the intervention of skilled outpatient physical therapy to address the aforementioned  deficits and progress towards a functional level in line with therapeutic goals.    OBJECTIVE IMPAIRMENTS: {opptimpairments:25111}.   ACTIVITY LIMITATIONS: {activitylimitations:27494}  PARTICIPATION LIMITATIONS: {participationrestrictions:25113}  PERSONAL FACTORS: {Personal factors:25162} are also affecting patient's functional outcome.   REHAB POTENTIAL: {rehabpotential:25112}  CLINICAL DECISION MAKING: {clinical decision making:25114}  EVALUATION COMPLEXITY: {Evaluation complexity:25115}  GOALS: Goals reviewed with patient? Yes  SHORT TERM GOALS: Target date: ***  Pt will be independent with administered HEP to demonstrate the competency necessary for long term managemnet of symptoms at home.  Baseline: Goal status: {GOALSTATUS:25110}  2.  *** Baseline:  Goal status: {GOALSTATUS:25110}  3.  *** Baseline:  Goal status: {GOALSTATUS:25110}  4.  *** Baseline:  Goal status: {GOALSTATUS:25110}  5.  *** Baseline:  Goal status: {GOALSTATUS:25110}  6.  *** Baseline:  Goal status: {GOALSTATUS:25110}  LONG TERM GOALS: Target date: ***  Pt. Will achieve a DASH score of *** as to demonstrate improvement in self-perceived functional ability with daily activities.  Baseline:  Goal status: {GOALSTATUS:25110}  2.  Pt will report pain levels improving during ADLs to be less than or equal to ***/10 as to demonstrate improved tolerance with daily functional activities such as ***.  Baseline:  Goal status: {GOALSTATUS:25110}  3.  Pt will improve MMT score for *** to a ***/5 to demonstrate improvement in strength for quality of motion and activity performance.  Baseline:  Goal status: {GOALSTATUS:25110}  4.  *** Baseline:  Goal status: {GOALSTATUS:25110}  5.  *** Baseline:  Goal status: {GOALSTATUS:25110}  6.  *** Baseline:  Goal status: {GOALSTATUS:25110}  PLAN: PT FREQUENCY: 2x/week  PT DURATION: 6 weeks  PLANNED INTERVENTIONS: {rehab planned  interventions:25118::"97110-Therapeutic exercises","97530- Therapeutic  [phone]- Neuromuscular re-education","97535- Self WGNF","62130- Manual therapy"}  PLAN FOR NEXT SESSION: Review HEP, Begin POC as detailed in assessment   Sheliah Plane, PT, DPT 08/08/2023, 7:39 AM   For all possible CPT codes, reference the Planned Interventions line above.     Check all conditions that are expected to impact treatment: {Conditions expected to impact treatment:{Conditions expected to impact treatment:28273}   If treatment provided at initial evaluation, no treatment charged due to lack of authorization.

## 2023-08-09 ENCOUNTER — Ambulatory Visit (INDEPENDENT_AMBULATORY_CARE_PROVIDER_SITE_OTHER): Payer: Medicaid Other | Admitting: Student

## 2023-08-09 ENCOUNTER — Encounter (HOSPITAL_COMMUNITY): Payer: Self-pay

## 2023-08-09 DIAGNOSIS — F331 Major depressive disorder, recurrent, moderate: Secondary | ICD-10-CM

## 2023-08-09 DIAGNOSIS — F4310 Post-traumatic stress disorder, unspecified: Secondary | ICD-10-CM

## 2023-08-09 MED ORDER — MIRTAZAPINE 15 MG PO TABS
7.5000 mg | ORAL_TABLET | Freq: Every day | ORAL | 0 refills | Status: AC
Start: 2023-09-06 — End: 2024-03-27

## 2023-08-09 MED ORDER — HYDROXYZINE HCL 25 MG PO TABS
ORAL_TABLET | ORAL | 0 refills | Status: DC
Start: 2023-09-06 — End: 2023-08-18

## 2023-08-10 ENCOUNTER — Telehealth: Payer: Self-pay | Admitting: Physician Assistant

## 2023-08-10 NOTE — Telephone Encounter (Signed)
 Pt called requesting some pain medication until appt with [facility]. Pt said he can not take this hip pain for another day. Please send medication to [facility]. Pt is asking for a call from [facility] when medication is sent. Pt phone number is  [phone].

## 2023-08-12 ENCOUNTER — Other Ambulatory Visit: Payer: Self-pay | Admitting: Licensed Clinical Social Worker

## 2023-08-12 ENCOUNTER — Telehealth: Payer: Self-pay | Admitting: Surgical

## 2023-08-12 ENCOUNTER — Ambulatory Visit: Payer: Medicaid Other | Admitting: Surgical

## 2023-08-12 NOTE — Patient Outreach (Signed)
 Medicaid Managed Care Social Work Note  08/12/2023 Name:  Chris [name] MRN:  [id number] DOB:  Oct 09, 1960  Chris [name] is an 63 y.o. year old male who is a primary patient of [name], MD.  The Medicaid Managed Care Coordination team was consulted for assistance with:  Mental Health Counseling and Resources  Chris [name] was given information about Medicaid Managed Care Coordination team services today. [name] Patient agreed to services and verbal consent obtained.  Engaged with patient  for by telephone forfollow up visit in response to referral for case management and/or care coordination services.   Patient is participating in a Managed Medicaid Plan:  Yes  Assessments/Interventions:  Review of past medical history, allergies, medications, health status, including review of consultants reports, laboratory and other test data, was performed as part of comprehensive evaluation and provision of chronic care management services.  SDOH: (Social Drivers of Health) assessments and interventions performed: SDOH Interventions    Flowsheet Row Office Visit from 07/26/2023 in [facility] [place] Patient Outreach Telephone from 07/21/2023 in  [place] POPULATION HEALTH DEPARTMENT Patient Outreach Telephone from 06/13/2023 in [place] POPULATION HEALTH DEPARTMENT Patient Outreach Telephone from 05/19/2023 in [place] POPULATION HEALTH DEPARTMENT Patient Outreach Telephone from 03/30/2023 in [place] POPULATION HEALTH DEPARTMENT Patient Outreach Telephone from 03/09/2023 in [place] POPULATION HEALTH DEPARTMENT  SDOH Interventions        Food Insecurity Interventions AMB Referral -- -- -- -- Intervention Not Indicated  Housing Interventions Intervention Not Indicated Intervention Not Indicated -- -- -- Intervention Not Indicated  Transportation Interventions AMB Referral Payor Benefit -- Payor Benefit -- --   Utilities Interventions AMB Referral -- -- Other (Comment)  [[name] referral for utilities] -- --  Alcohol Usage Interventions Intervention Not Indicated (Score <7) -- -- -- -- --  Depression Interventions/Treatment  Currently on Treatment -- -- -- -- --  Financial Strain Interventions Other (Comment) -- -- -- -- --  Stress Interventions Other (Comment) -- Programmer, applications Provided -- [employer], Provide Counseling  [Pt reports his BH meds continues to help with anxiety and sleep but he is struggling with pain in his hip at this time which led to a recent ED visit] --  Social Connections Interventions Intervention Not Indicated -- -- -- -- --  Health Literacy Interventions Intervention Not Indicated -- -- -- -- --       Advanced Directives Status:  See Care Plan for related entries.  Care Plan                 No Known Allergies  Medications Reviewed Today     Reviewed by Gustavus Bryant, LCSW (Social Worker) on 08/12/23 at [phone]  Med List Status: <None>   Medication Order Taking? Sig Documenting Provider Last Dose Status Informant  Accu-Chek Softclix Lancets lancets [id number] No Use as instructed [name], MD Taking Active Self  acetaminophen (TYLENOL) 500 MG tablet [id number] No Take 1-2 tablets (500-1,000 mg total) by mouth every 6 (six) hours as needed (pain.). Gerhard Munch, MD Taking Active   albuterol (PROVENTIL) (2.5 MG/3ML) 0.083% nebulizer solution [id number] No Take 3 mLs (2.5 mg total) by nebulization every 4 (four) hours as needed for wheezing or shortness of breath. Georgian Co M, PA-C Taking Active   albuterol (VENTOLIN HFA) 108 (90 Base) MCG/ACT inhaler [id number] No Inhale 2 puffs into the lungs every 6 (six) hours as  needed for wheezing or shortness of breath. [name], MD Taking Active   allopurinol (ZYLOPRIM) 100 MG tablet [id number] No TAKE 2 TABLETS (200 MG TOTAL) BY MOUTH DAILY. [name], MD Taking Active    apixaban (ELIQUIS) 5 MG TABS tablet [id number] No Take 1 tablet (5 mg total) by mouth 2 (two) times daily. Laurey Morale, MD Taking Active   atorvastatin (LIPITOR) 80 MG tablet [id number] No Take 1 tablet (80 mg total) by mouth daily. [name], MD Taking Active   Blood Glucose Monitoring Suppl (ACCU-CHEK AVIVA PLUS) w/Device KIT [id number] No 1 each by Does not apply route 3 (three) times daily. [name], MD Taking Active   carvedilol (COREG) 12.5 MG tablet [id number] No Take 1 tablet (12.5 mg total) by mouth 2 (two) times daily. [name], MD Taking Active   dapagliflozin propanediol (FARXIGA) 10 MG TABS tablet [id number] No Take 1 tablet (10 mg total) by mouth daily. [name], MD Taking Active   fluticasone-salmeterol [facility]) [id number] MCG/ACT inhaler [id number] No Inhale 2 puffs into the lungs 2 (two) times daily. [name], MD Taking Active   hydrOXYzine (ATARAX) 25 MG tablet [id number]  Take 1 tablet 3 times a day as needed for anxiety. Take 2 tablets at night as need for sleep Park Pope, MD  Active   isosorbide-hydrALAZINE (BIDIL) 20-37.5 MG tablet [id number] No Take 1 tablet by mouth 3 (three) times daily. [name], MD Taking Active   lidocaine (LIDODERM) 5 % [id number] No Place 1 patch onto the skin daily. Remove & Discard patch within 12 hours or as directed by MD Gareth Eagle, PA-C Taking Active   Discontinued 07/11/20 [phone] (Discontinued by provider)   mirtazapine (REMERON) 15 MG tablet [id number]  Take 0.5 tablets (7.5 mg total) by mouth at bedtime. Park Pope, MD  Active   oxyCODONE-acetaminophen (PERCOCET/ROXICET) 5-325 MG tablet [id number] No Take 1 tablet by mouth every 4 (four) hours as needed for severe pain (pain score 7-10). Juanda Chance, NP Taking Active   potassium chloride (KLOR-CON) 10 MEQ tablet [id number] No Take 2 tablets (20 mEq total) by mouth daily. [name], MD Taking Active   predniSONE (DELTASONE) 20 MG  tablet [id number] No 1 tab PO daily x 5 days then 1/2 tab daily x 5 days [name], MD Taking Active   Tiotropium Bromide Monohydrate (SPIRIVA RESPIMAT) 2.5 MCG/ACT AERS [id number] No Inhale 2 puffs into the lungs daily. [name], MD Taking Active   Torsemide 40 MG TABS [id number] No Take 20 mg daily, alternating with 40 mg daily. [name], MD Taking Active             Patient Active Problem List   Diagnosis Date Noted   Chills 07/29/2023   Arthritis of right hip 07/29/2023   Pain in left shoulder 07/27/2023   Post traumatic stress disorder 09/23/2022   MDD (major depressive disorder), recurrent episode, moderate (HCC) 08/26/2022   Stage 3b chronic kidney disease (HCC) 08/09/2022   Hypertrophic cardiomyopathy (HCC) 08/09/2022   History of renal cell cancer 08/09/2022   Chronic heart failure with preserved ejection fraction (HFpEF) (HCC) 07/13/2022   Agitation 04/23/2022   RSV (respiratory syncytial virus pneumonia) 04/22/2022   Obesity (BMI 30-39.9) 04/21/2022   COPD exacerbation (HCC) 04/19/2022   Dyslipidemia 04/19/2022   Gout 04/19/2022   Right kidney mass 05/14/2021   CHF exacerbation (HCC) 04/14/2021   Chest pain 04/14/2021   Syncope  04/14/2021   Left-sided weakness 10/28/2020   Typical atrial flutter (HCC)    CHF (congestive heart failure) (HCC) 07/04/2020   Acute exacerbation of CHF (congestive heart failure) (HCC) 06/16/2020   Influenza vaccine refused 05/06/2020   Acute decompensated heart failure (HCC) 05/04/2020   Illiteracy 05/04/2020   Type 2 diabetes mellitus with stage 3 chronic kidney disease (HCC) 12/25/2019   Elevated troponin I level 10/26/2019   History of gout 02/01/2019   Seasonal allergic rhinitis due to pollen 02/01/2019   Nicotine use disorder 11/30/2018   Microscopic hematuria 11/30/2018   Depression 11/30/2018   Difficulty controlling anger 11/30/2018   COPD (chronic obstructive pulmonary disease) (HCC)    CKD  (chronic kidney disease) stage 3, GFR 30-59 ml/min (HCC) 08/10/2018   Recurrent epistaxis 04/21/2018   Mixed hyperlipidemia 07/28/2017   Essential hypertension 07/28/2017   Chronic systolic heart failure (HCC) 10/25/2014   Cocaine abuse (HCC) 02/20/2013   Cannabis abuse 02/20/2013   Back pain, chronic 02/20/2013    Conditions to be addressed/monitored per PCP order:  Anxiety and Depression  Care Plan : General Social Work (Adult)  Updates made by Gustavus Bryant, LCSW since 08/12/2023 12:00 AM     Problem: Depression Identification (Depression)      Long-Range Goal: To increase my self care and gain mental health support   Start Date: 07/08/2022  Priority: High  Note:   Timeframe:  Long-Range Goal Priority:  High Start Date:   07/08/22                    Expected End Date:ongoing    Follow Up Date- Goal met and ended on 08/12/23   - begin personal counseling - call and visit an old friend - check out volunteer opportunities - join a support group - laugh; watch a funny movie or comedian - learn and use visualization or guided imagery - perform a random act of kindness - practice relaxation or meditation daily - start or continue a personal journal - talk about feelings with a friend, family or spiritual advisor - practice positive thinking and self-talk    Why is this important?   When you are stressed, down or upset, your body reacts too.  For example, your blood pressure may get higher; you may have a headache or stomachache.  When your emotions get the best of you, your body's ability to fight off cold and flu gets weak.  These steps will help you manage your emotions.   Current Barriers:  Chronic Mental Health needs related to depression and need for housing Limited social support, Housing barriers, and Mental Health Concerns  Social Isolation ADL IADL limitations Suicidal Ideation/Homicidal Ideation: No  Clinical Social Work Goal(s):  Over the next 120 days,  patient will work with LCSW monthly by telephone or in person to reduce or manage symptoms related to depression and relapse prevention patient will work with [name] to address needs related to financial, food and transportation support. Patient wishes to offset medical and housing expenses with financial support as housing resources are very limited at this time  Interventions: Patient interviewed and appropriate assessments performed: brief mental health assessment Discussed plans with patient for ongoing care management follow up and provided patient with direct contact information for care management team Assisted patient/caregiver with obtaining information about health plan benefits Patient was educated on the Advance Directives process. Patient was encouraged to complete document with PCP.  [facility] LCSW provided emotional support and reflective listening.  Solution-Focused Strategies, Mindfulness or Relaxation Training, Active listening / Reflection utilized , Emotional Supportive Provided, and Verbalization of feelings encouraged  Patient declines any current substance use. Patient has a history of cocaine abuse. Relapse prevention education provided.  Patient reports that he has developed social anxiety. He admits that he gets irritable at times and will snap at his loved ones and then will experience guilt afterwards. Educated patient on coping methods to implement into his daily life to combat anxiety symptoms and stress. Patient denied any current suicidal or homicidal ideations. Encouraged patient to implement deep breathing and grounding exercises into his daily routine due to ongoing anxiety and SOB.  Patient reports that he will start implementing appropriate self-care habits into his daily routine such as: drinking water, staying active around the house, taking his medications as prescribed, combating negative thoughts or emotions and staying connected with his support network. Positive  reinforcement provided.  Past Update- Patient missed his psychiatry appointment yesterday at [facility] which makes the second no show. He is at risk of not being able to be seen at their practice and was advised to contact them today to inquire. Patient reports that he will be able to attend his upcoming counseling appointment on 03/15/23. [facility] LCSW provided extensive self-care education which including keeping up with his appointments. Patient was advised to use a calendar to assist with his scheduling. Patient reports that he is stable and his mood management continues to improve. 03/30/23 update- Patient reports that he was ready for his follow up psychiatry appointment this month and his Medicaid transportation did not arrive so he had to complete a video visit with his provider which thankfully was able to accommodate. [facility] LCSW reminded patient of his new follow up behavioral health appointments. Patient reports that his [facility] medicine continues to help with his sleep and anxiety but now he is experiencing more physical pain in his hip area which led to a recent ED visit. [facility] LCSW advised pt to discuss pain management options with his PCP. He was advised to contact PCP office today. [facility] LCSW provided brief self-care and anxiety management coping skill education. 06/13/23- Patient successfully completed her follow up therapy appointment with his counselor at [facility] and has follow up appointments with both counselor and psychiatrist next month. He reports that he is in extreme pain and discomfort and may go to the urgent care center or ED today if his symptoms do not improve. [facility] LCSW will update PCP and [facility] care team. Crisis resources successfully provided to patient today as well as self-care tips specifically for anxiety/pain management. 08/12/23 Update- [facility] LCSW successfully completed follow up call with patient. Patient denies any current crises at this time. He denies any SI/HI. He is aware of crisis support  resources that he can utilize if ever needed. He was encouraged to look at his email for the resources that both [facility] LCSW and [facility] [name] have sent regarding community resource support. He was appreciative of the follow up call and is agreeable to LCSW social work case closure at this time as he has been successfully established with long term BH provider, has been educated on [facility] resources within his area and has a list of these to revert back to in his email if needed.   Patient Self Care Activities:  Ability for insight Agreement to start mental health treatment and to the self-care journey   Patient Coping Strengths:  Self Advocate Able to Communicate Effectively  Patient Self Care Deficits:  Lacks social connections  The following coping skill education was provided for stress relief and mental health management: "When your car dies or a deadline looms, how do you respond? Long-term, low-grade or acute stress takes a serious toll on your body and mind, so don't ignore feelings of constant tension. Stress is a natural part of life. However, too much stress can harm our health, especially if it continues every day. This is chronic stress and can put you at risk for heart problems like heart disease and depression. Understand what's happening inside your body and learn simple coping skills to combat the negative impacts of everyday stressors.  Types of Stress There are two types of stress: Emotional - types of emotional stress are relationship problems, pressure at work, financial worries, experiencing discrimination or having a major life change. Physical - Examples of physical stress include being sick having pain, not sleeping well, recovery from an injury or having an alcohol and drug use disorder. Fight or Flight Sudden or ongoing stress activates your nervous system and floods your bloodstream with adrenaline and cortisol, two hormones that raise blood pressure, increase heart rate and spike  blood sugar. These changes pitch your body into a fight or flight response. That enabled our ancestors to outrun saber-toothed tigers, and it's helpful today for situations like dodging a car accident. But most modern chronic stressors, such as finances or a challenging relationship, keep your body in that heightened state, which hurts your health. Effects of Too Much Stress If constantly under stress, most of [place] will eventually start to function less well.  Multiple studies link chronic stress to a higher risk of heart disease, stroke, depression, weight gain, memory loss and even premature death, so it's important to recognize the warning signals. Talk to your doctor about ways to manage stress if you're experiencing any of these symptoms: Prolonged periods of poor sleep. Regular, severe headaches. Unexplained weight loss or gain. Feelings of isolation, withdrawal or worthlessness. Constant anger and irritability. Loss of interest in activities. Constant worrying or obsessive thinking. Excessive alcohol or drug use. Inability to concentrate.  10 Ways to Cope with Chronic Stress It's key to recognize stressful situations as they occur because it allows you to focus on managing how you react. We all need to know when to close our eyes and take a deep breath when we feel tension rising. Use these tips to prevent or reduce chronic stress. 1. Rebalance Work and Home All work and no play? If you're spending too much time at the office, intentionally put more dates in your calendar to enjoy time for fun, either alone or with others. 2. Get Regular Exercise Moving your body on a regular basis balances the nervous system and increases blood circulation, helping to flush out stress hormones. Even a daily 20-minute walk makes a difference. Any kind of exercise can lower stress and improve your mood ? just pick activities that you enjoy and make it a regular habit. 3. Eat Well and Limit Alcohol and  Stimulants Alcohol, nicotine and caffeine may temporarily relieve stress but have negative health impacts and can make stress worse in the long run. Well-nourished bodies cope better, so start with a good breakfast, add more organic fruits and vegetables for a well-balanced diet, avoid processed foods and sugar, try herbal tea and drink more water. 4. Connect with Supportive People Talking face to face with another person releases hormones that reduce stress. Lean on those good listeners in your life. 5. Carve  Out Hobby Time Do you enjoy gardening, reading, listening to music or some other creative pursuit? Engage in activities that bring you pleasure and joy; research shows that reduces stress by almost half and lowers your heart rate, too. 6. Practice Meditation, Stress Reduction or Yoga Relaxation techniques activate a state of restfulness that counterbalances your body's fight-or-flight hormones. Even if this also means a 10-minute break in a long day: listen to music, read, go for a walk in nature, do a hobby, take a bath or spend time with a friend. Also consider doing a mindfulness exercise or try a daily deep breathing or imagery practice. Deep Breathing Slow, calm and deep breathing can help you relax. Try these steps to focus on your breathing and repeat as needed. Find a comfortable position and close your eyes. Exhale and drop your shoulders. Breathe in through your nose; fill your lungs and then your belly. Think of relaxing your body, quieting your mind and becoming calm and peaceful. Breathe out slowly through your nose, relaxing your belly. Think of releasing tension, pain, worries or distress. Repeat steps three and four until you feel relaxed. Imagery This involves using your mind to excite the senses -- sound, vision, smell, taste and feeling. This may help ease your stress. Begin by getting comfortable and then do some slow breathing. Imagine a place you love being at. It could  be somewhere from your childhood, somewhere you vacationed or just a place in your imagination. Feel how it is to be in the place you're imagining. Pay attention to the sounds, air, colors, and who is there with you. This is a place where you feel cared for and loved. All is well. You are safe. Take in all the smells, sounds, tastes and feelings. As you do, feel your body being nourished and healed. Feel the calm that surrounds you. Breathe in all the good. Breathe out any discomfort or tension. 7. Sleep Enough If you get less than seven to eight hours of sleep, your body won't tolerate stress as well as it could. If stress keeps you up at night, address the cause, and add extra meditation into your day to make up for the lost z's. Try to get seven to nine hours of sleep each night. Make a regular bedtime schedule. Keep your room dark and cool. Try to avoid computers, TV, cell phones and tablets before bed. 8. Bond with Connections You Enjoy Go out for a coffee with a friend, chat with a neighbor, call a family member, visit with a clergy member, or even hang out with your pet. Clinical studies show that spending even a short time with a companion animal can cut anxiety levels almost in half. 9. Take a Vacation Getting away from it all can reset your stress tolerance by increasing your mental and emotional outlook, which makes you a happier, more productive person upon return. Leave your cellphone and laptop at home! 10. See a Counselor, Coach or Therapist If negative thoughts overwhelm your ability to make positive changes, it's time to seek professional help. Make an appointment today--your health and life are worth it."     24- Hour Availability:    [facility]  [address] [place] [phone] Crisis [phone]   [facility] [phone]   [place] [facility]  [phone]    [facility]  [phone] (after hours)   Therapeutic Alternative/Mobile Crisis   [phone]  [place] National Suicide Hotline  9794229329 (TALK) OR 988   Call 911 or go to emergency room   [facility]  [phone]);  [place] and [employer]  [phone]); [place], [place], [place], [place], [place], [place], [place]         Follow up goal      Follow up:  Patient requests no follow-up at this time.  Plan: The Managed Medicaid care management team is available to follow up with the patient after provider conversation with the patient regarding recommendation for care management engagement and subsequent re-referral to the care management team.   Dickie La, [name], MSW, LCSW Licensed Clinical Social Worker [place]   [email or web address] [email or web address] Direct Dial: [phone]

## 2023-08-12 NOTE — Telephone Encounter (Signed)
 I think I would continue with Marianne's suggestion of Tylenol and Lidoderm.  I do not think opioid medication is indicated and he cannot take NSAIDs due to being on Eliquis.  I also do not think it is appropriate for me to prescribe opioid medication for patient I have not met yet who does not have a fracture or recent surgery.  If he is still having severe uncontrolled pain, I would see what Clerance Lav wants to do in the meantime

## 2023-08-12 NOTE — Patient Instructions (Signed)
 Visit Information  Chris [name] was given information about Medicaid Managed Care team care coordination services as a part of their Healthy [facility] Medicaid benefit. Chris [name] verbally consented to engagement with the [facility] Managed Care team.   If you are experiencing a medical emergency, please call 911 or report to your local emergency department or urgent care.   If you have a non-emergency medical problem during routine business hours, please contact your provider's office and ask to speak with a nurse.   For questions related to your Healthy [facility] health plan, please call: [phone] or visit the homepage here: [email or web address]  If you would like to schedule transportation through your Healthy [facility] plan, please call the following number at least 2 days in advance of your appointment: [phone]  For information about your ride after you set it up, call Ride Assist at [phone]. Use this number to activate a Will Call pickup, or if your transportation is late for a scheduled pickup. Use this number, too, if you need to make a change or cancel a previously scheduled reservation.  If you need transportation services right away, call [phone]. The after-hours call center is staffed 24 hours to handle ride assistance and urgent reservation requests (including discharges) 365 days a year. Urgent trips include sick visits, hospital discharge requests and life-sustaining treatment.  Call the [facility] Line at [phone], at any time, 24 hours a day, 7 days a week. If you are in danger or need immediate medical attention call 911.  If you would like help to quit smoking, call 1-800-QUIT-NOW (931-455-0999) OR Espaol: 1-855-Djelo-Ya (5-427-062-3762) o para ms informacin haga clic aqu or Text READY to 831-517 to register via text  Following is a copy of your plan of care:  Care Plan : General Social Work  (Adult)  Updates made by Gustavus Bryant, LCSW since 08/12/2023 12:00 AM     Problem: Depression Identification (Depression)      Long-Range Goal: To increase my self care and gain mental health support   Start Date: 07/08/2022  Priority: High  Note:   Timeframe:  Long-Range Goal Priority:  High Start Date:   07/08/22                    Expected End Date:ongoing    Follow Up Date- Goal met and ended on 08/12/23   - begin personal counseling - call and visit an old friend - check out volunteer opportunities - join a support group - laugh; watch a funny movie or comedian - learn and use visualization or guided imagery - perform a random act of kindness - practice relaxation or meditation daily - start or continue a personal journal - talk about feelings with a friend, family or spiritual advisor - practice positive thinking and self-talk    Why is this important?   When you are stressed, down or upset, your body reacts too.  For example, your blood pressure may get higher; you may have a headache or stomachache.  When your emotions get the best of you, your body's ability to fight off cold and flu gets weak.  These steps will help you manage your emotions.   Current Barriers:  Chronic Mental Health needs related to depression and need for housing Limited social support, Housing barriers, and Mental Health Concerns  Social Isolation ADL IADL limitations Suicidal Ideation/Homicidal Ideation: No  Clinical Social Work Goal(s):  Over the next 120 days, patient will work with [employer]  monthly by telephone or in person to reduce or manage symptoms related to depression and relapse prevention patient will work with [name] to address needs related to financial, food and transportation support. Patient wishes to offset medical and housing expenses with financial support as housing resources are very limited at this time Patient Self Care Activities:  Ability for insight Agreement to start  mental health treatment and to the self-care journey   Patient Coping Strengths:  Self Advocate Able to Communicate Effectively  Patient Self Care Deficits:  Lacks social connections  The following coping skill education was provided for stress relief and mental health management: "When your car dies or a deadline looms, how do you respond? Long-term, low-grade or acute stress takes a serious toll on your body and mind, so don't ignore feelings of constant tension. Stress is a natural part of life. However, too much stress can harm our health, especially if it continues every day. This is chronic stress and can put you at risk for heart problems like heart disease and depression. Understand what's happening inside your body and learn simple coping skills to combat the negative impacts of everyday stressors.  Types of Stress There are two types of stress: Emotional - types of emotional stress are relationship problems, pressure at work, financial worries, experiencing discrimination or having a major life change. Physical - Examples of physical stress include being sick having pain, not sleeping well, recovery from an injury or having an alcohol and drug use disorder. Fight or Flight Sudden or ongoing stress activates your nervous system and floods your bloodstream with adrenaline and cortisol, two hormones that raise blood pressure, increase heart rate and spike blood sugar. These changes pitch your body into a fight or flight response. That enabled our ancestors to outrun saber-toothed tigers, and it's helpful today for situations like dodging a car accident. But most modern chronic stressors, such as finances or a challenging relationship, keep your body in that heightened state, which hurts your health. Effects of Too Much Stress If constantly under stress, most of [place] will eventually start to function less well.  Multiple studies link chronic stress to a higher risk of heart disease, stroke,  depression, weight gain, memory loss and even premature death, so it's important to recognize the warning signals. Talk to your doctor about ways to manage stress if you're experiencing any of these symptoms: Prolonged periods of poor sleep. Regular, severe headaches. Unexplained weight loss or gain. Feelings of isolation, withdrawal or worthlessness. Constant anger and irritability. Loss of interest in activities. Constant worrying or obsessive thinking. Excessive alcohol or drug use. Inability to concentrate.  10 Ways to Cope with Chronic Stress It's key to recognize stressful situations as they occur because it allows you to focus on managing how you react. We all need to know when to close our eyes and take a deep breath when we feel tension rising. Use these tips to prevent or reduce chronic stress. 1. Rebalance Work and Home All work and no play? If you're spending too much time at the office, intentionally put more dates in your calendar to enjoy time for fun, either alone or with others. 2. Get Regular Exercise Moving your body on a regular basis balances the nervous system and increases blood circulation, helping to flush out stress hormones. Even a daily 20-minute walk makes a difference. Any kind of exercise can lower stress and improve your mood ? just pick activities that you enjoy and make it a regular habit.  3. Eat Well and Limit Alcohol and Stimulants Alcohol, nicotine and caffeine may temporarily relieve stress but have negative health impacts and can make stress worse in the long run. Well-nourished bodies cope better, so start with a good breakfast, add more organic fruits and vegetables for a well-balanced diet, avoid processed foods and sugar, try herbal tea and drink more water. 4. Connect with Supportive People Talking face to face with another person releases hormones that reduce stress. Lean on those good listeners in your life. 5. Carve Out Hobby Time Do you enjoy  gardening, reading, listening to music or some other creative pursuit? Engage in activities that bring you pleasure and joy; research shows that reduces stress by almost half and lowers your heart rate, too. 6. Practice Meditation, Stress Reduction or Yoga Relaxation techniques activate a state of restfulness that counterbalances your body's fight-or-flight hormones. Even if this also means a 10-minute break in a long day: listen to music, read, go for a walk in nature, do a hobby, take a bath or spend time with a friend. Also consider doing a mindfulness exercise or try a daily deep breathing or imagery practice. Deep Breathing Slow, calm and deep breathing can help you relax. Try these steps to focus on your breathing and repeat as needed. Find a comfortable position and close your eyes. Exhale and drop your shoulders. Breathe in through your nose; fill your lungs and then your belly. Think of relaxing your body, quieting your mind and becoming calm and peaceful. Breathe out slowly through your nose, relaxing your belly. Think of releasing tension, pain, worries or distress. Repeat steps three and four until you feel relaxed. Imagery This involves using your mind to excite the senses -- sound, vision, smell, taste and feeling. This may help ease your stress. Begin by getting comfortable and then do some slow breathing. Imagine a place you love being at. It could be somewhere from your childhood, somewhere you vacationed or just a place in your imagination. Feel how it is to be in the place you're imagining. Pay attention to the sounds, air, colors, and who is there with you. This is a place where you feel cared for and loved. All is well. You are safe. Take in all the smells, sounds, tastes and feelings. As you do, feel your body being nourished and healed. Feel the calm that surrounds you. Breathe in all the good. Breathe out any discomfort or tension. 7. Sleep Enough If you get less than seven  to eight hours of sleep, your body won't tolerate stress as well as it could. If stress keeps you up at night, address the cause, and add extra meditation into your day to make up for the lost z's. Try to get seven to nine hours of sleep each night. Make a regular bedtime schedule. Keep your room dark and cool. Try to avoid computers, TV, cell phones and tablets before bed. 8. Bond with Connections You Enjoy Go out for a coffee with a friend, chat with a neighbor, call a family member, visit with a clergy member, or even hang out with your pet. Clinical studies show that spending even a short time with a companion animal can cut anxiety levels almost in half. 9. Take a Vacation Getting away from it all can reset your stress tolerance by increasing your mental and emotional outlook, which makes you a happier, more productive person upon return. Leave your cellphone and laptop at home! 10. See a Veterinary surgeon, Educational psychologist  If negative thoughts overwhelm your ability to make positive changes, it's time to seek professional help. Make an appointment today--your health and life are worth it."     24- Hour Availability:    [facility]  [address] [place], [place] [phone] Crisis [phone]   [facility] [phone]   [facility]  [phone]    [facility]  [phone] (after hours)   Therapeutic Alternative/Mobile Crisis   [phone]   [place] National Suicide Hotline  562-571-6641 Len Childs) [place] 623   Call 911 or go to emergency room   [facility]  [phone]);  [place] and [employer]  [phone]); [place], [place], [place], [place], [place], [place], [place]         Follow up goal  Dickie La, [name], MSW, [employer] Licensed Clinical Social Worker [employer]   [email or web address] Direct Dial:  [phone]

## 2023-08-12 NOTE — Telephone Encounter (Signed)
 Patient called asked if he can get something called into the pharmacy for pain? Patient uses [facility]. Patient asked for a call when medicine is sent to the pharmacy. The number to contact patient is [phone]

## 2023-08-15 NOTE — Telephone Encounter (Signed)
 Tried calling to advise. Unable to reach patient.

## 2023-08-17 ENCOUNTER — Other Ambulatory Visit: Payer: Self-pay | Admitting: Internal Medicine

## 2023-08-17 DIAGNOSIS — M10072 Idiopathic gout, left ankle and foot: Secondary | ICD-10-CM

## 2023-08-17 NOTE — Telephone Encounter (Signed)
 Copied from CRM [id number]. Topic: Clinical - Medication Refill >> Aug 17, 2023 11:27 AM Ivette P wrote: Most Recent Primary Care Visit:  Provider: Kerby Nora E  Department: [facility]  Visit Type: ACUTE  Date: 07/29/2023  Medication:  colchicine  Has the patient contacted their pharmacy? No (Agent: If no, request that the patient contact the pharmacy for the refill. If patient does not wish to contact the pharmacy document the reason why and proceed with request.) (Agent: If yes, when and what did the pharmacy advise?)  Is this the correct pharmacy for this prescription? Yes If no, delete pharmacy and type the correct one.  This is the patient's preferred pharmacy:  [facility] - [place], [place] - [address] [place] [address] Phone: [phone] Fax: [phone]   Has the prescription been filled recently? No  Is the patient out of the medication? Yes  Has the patient been seen for an appointment in the last year OR does the patient have an upcoming appointment? Yes  Can we respond through MyChart? Yes  Agent: Please be advised that Rx refills may take up to 3 business days. We ask that you follow-up with your pharmacy.

## 2023-08-17 NOTE — Telephone Encounter (Unsigned)
 Copied from CRM [id number]. Topic: Clinical - Medication Refill >> Aug 17, 2023  1:20 PM [name] B wrote: Most Recent Primary Care Visit:  Provider: Kerby Nora E  Department: [facility]  Visit Type: ACUTE  Date: 07/29/2023  Medication: colchicine 0.6 MG tablet  Has the patient contacted their pharmacy? yes  (Agent: If yes, when and what did the pharmacy advise?)contact pcp, says request too soon from previous request, but pt says he never picked this up before  Is this the correct pharmacy for this prescription? yes  This is the patient's preferred pharmacy:  [facility] - [place], [place] - [address] [place] [address] Phone: [phone] Fax: [phone]   Has the prescription been filled recently? yes  Is the patient out of the medication? yes  Has the patient been seen for an appointment in the last year OR does the patient have an upcoming appointment? yes  Can we respond through MyChart? yes  Agent: Please be advised that Rx refills may take up to 3 business days. We ask that you follow-up with your pharmacy.

## 2023-08-18 ENCOUNTER — Other Ambulatory Visit: Payer: Self-pay

## 2023-08-18 ENCOUNTER — Emergency Department (HOSPITAL_COMMUNITY)
Admission: EM | Admit: 2023-08-18 | Discharge: 2023-08-18 | Disposition: A | Discharge status: Home / Self Care | Attending: Emergency Medicine | Admitting: Emergency Medicine

## 2023-08-18 ENCOUNTER — Emergency Department (HOSPITAL_COMMUNITY)

## 2023-08-18 DIAGNOSIS — R0602 Shortness of breath: Secondary | ICD-10-CM | POA: Diagnosis not present

## 2023-08-18 DIAGNOSIS — R079 Chest pain, unspecified: Secondary | ICD-10-CM | POA: Diagnosis not present

## 2023-08-18 DIAGNOSIS — R052 Subacute cough: Secondary | ICD-10-CM

## 2023-08-18 DIAGNOSIS — R059 Cough, unspecified: Secondary | ICD-10-CM | POA: Diagnosis not present

## 2023-08-18 DIAGNOSIS — J441 Chronic obstructive pulmonary disease with (acute) exacerbation: Secondary | ICD-10-CM | POA: Diagnosis not present

## 2023-08-18 DIAGNOSIS — Z7901 Long term (current) use of anticoagulants: Secondary | ICD-10-CM | POA: Diagnosis not present

## 2023-08-18 DIAGNOSIS — R0989 Other specified symptoms and signs involving the circulatory and respiratory systems: Secondary | ICD-10-CM | POA: Diagnosis not present

## 2023-08-18 DIAGNOSIS — Z7951 Long term (current) use of inhaled steroids: Secondary | ICD-10-CM | POA: Diagnosis not present

## 2023-08-18 DIAGNOSIS — I517 Cardiomegaly: Secondary | ICD-10-CM | POA: Diagnosis not present

## 2023-08-18 DIAGNOSIS — Z955 Presence of coronary angioplasty implant and graft: Secondary | ICD-10-CM | POA: Insufficient documentation

## 2023-08-18 DIAGNOSIS — R918 Other nonspecific abnormal finding of lung field: Secondary | ICD-10-CM | POA: Diagnosis not present

## 2023-08-18 LAB — BASIC METABOLIC PANEL
Anion gap: 8 (ref 5–15)
BUN: 45 mg/dL — ABNORMAL HIGH (ref 6–20)
CO2: 22 mmol/L (ref 22–32)
Calcium: 8.6 mg/dL — ABNORMAL LOW (ref 8.9–10.3)
Chloride: 106 mmol/L (ref 98–111)
Creatinine, Ser: 2.34 mg/dL — ABNORMAL HIGH (ref 0.61–1.24)
GFR, Estimated: 31 mL/min — ABNORMAL LOW (ref >60)
Glucose, Bld: 111 mg/dL — ABNORMAL HIGH (ref 70–99)
Potassium: 4.6 mmol/L (ref 3.5–5.1)
Sodium: 136 mmol/L (ref 135–145)

## 2023-08-18 LAB — CBC
HCT: 44.6 % (ref 39.0–52.0)
Hemoglobin: 14.5 g/dL (ref 13.0–17.0)
MCH: 29.8 pg (ref 26.0–34.0)
MCHC: 32.5 g/dL (ref 30.0–36.0)
MCV: 91.8 fL (ref 80.0–100.0)
Platelets: 138 10*3/uL — ABNORMAL LOW (ref 150–400)
RBC: 4.86 MIL/uL (ref 4.22–5.81)
RDW: 15.1 % (ref 11.5–15.5)
WBC: 9.6 10*3/uL (ref 4.0–10.5)
nRBC: 0 % (ref 0.0–0.2)

## 2023-08-18 LAB — RESP PANEL BY RT-PCR (RSV, FLU A&B, COVID)  RVPGX2
Influenza A by PCR: NEGATIVE
Influenza B by PCR: NEGATIVE
Resp Syncytial Virus by PCR: NEGATIVE
SARS Coronavirus 2 by RT PCR: NEGATIVE

## 2023-08-18 LAB — BRAIN NATRIURETIC PEPTIDE: B Natriuretic Peptide: 98 pg/mL (ref 0.0–100.0)

## 2023-08-18 LAB — TROPONIN I (HIGH SENSITIVITY): Troponin I (High Sensitivity): 53 ng/L — ABNORMAL HIGH (ref <18)

## 2023-08-18 MED ORDER — PREDNISONE 10 MG PO TABS: ORAL_TABLET | ORAL | 0 refills | Status: AC

## 2023-08-18 MED ORDER — COLCHICINE 0.6 MG PO TABS: ORAL_TABLET | ORAL | 2 refills | Status: DC

## 2023-08-18 MED ORDER — METHYLPREDNISOLONE SODIUM SUCC 125 MG IJ SOLR
125.0000 mg | Freq: Once | INTRAMUSCULAR | Status: AC
  Administered 2023-08-18: 125 mg via INTRAVENOUS
  Filled 2023-08-18: qty 2

## 2023-08-18 MED ORDER — BENZONATATE 100 MG PO CAPS
100.0000 mg | ORAL_CAPSULE | Freq: Once | ORAL | Status: AC
  Administered 2023-08-18: 100 mg via ORAL
  Filled 2023-08-18: qty 1

## 2023-08-18 MED ORDER — MAGNESIUM SULFATE 2 GM/50ML IV SOLN
2.0000 g | Freq: Once | INTRAVENOUS | Status: AC
  Administered 2023-08-18: 2 g via INTRAVENOUS
  Filled 2023-08-18: qty 50

## 2023-08-18 MED ORDER — BENZONATATE 100 MG PO CAPS: 100.0000 mg | ORAL_CAPSULE | Freq: Three times a day (TID) | ORAL | 0 refills | Status: AC | PRN

## 2023-08-18 MED ORDER — ALBUTEROL SULFATE (2.5 MG/3ML) 0.083% IN NEBU: 2.5000 mg | INHALATION_SOLUTION | RESPIRATORY_TRACT | 6 refills | Status: AC | PRN

## 2023-08-18 MED ORDER — ALBUTEROL SULFATE (2.5 MG/3ML) 0.083% IN NEBU
10.0000 mg/h | INHALATION_SOLUTION | RESPIRATORY_TRACT | Status: AC
  Administered 2023-08-18: 10 mg/h via RESPIRATORY_TRACT
  Filled 2023-08-18: qty 12

## 2023-08-18 NOTE — ED Provider Notes (Signed)
 [place] EMERGENCY DEPARTMENT AT [facility] Provider Note   CSN: [id number] Arrival date & time: 08/18/23  1914     History  Chief Complaint  Patient presents with   Cough   Chest Pain   Near Syncope   Shortness of Breath    Chris [name] is a 63 y.o. male with a history of emphysema presented to the ED with coughing spells, chest pain, lightheadedness.  Patient reports he said worsening cough for about 2 days, associated with chest pain while he is coughing, feeling very short of breath.  He feels that he may have lost consciousness briefly 3 times in the past 2 days.  He does have a prior history of syncope often in the setting of heavy coughing.  He reports subjective fevers and chills at home, and says he feels fatigued.  He reports he quit smoking 2 weeks ago.  Heavy smoking history prior to that.  He does use nebulizer machines at home  I reviewed his external records.  He had a CT scan of his lungs performed 2 months ago in January 2025 for lung cancer screening, which noted emphysematous changes, bibasilar atelectasis, no suspicious pulmonary nodules, no pleural effusion.  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  He also has been seen by pulmonology in the past, most recently January of this year.  He is noted to have 42-pack-year smoking history.  He is on Spiriva, Advair, albuterol as needed.  HPI     Home Medications Prior to Admission medications   Medication Sig Start Date End Date Taking? Authorizing Provider  acetaminophen (TYLENOL) 500 MG tablet Take 1-2 tablets (500-1,000 mg total) by mouth every 6 (six) hours as needed (pain.). 12/09/22  Yes Gerhard Munch, MD  albuterol (PROVENTIL) (2.5 MG/3ML) 0.083% nebulizer solution Take 3 mLs (2.5 mg total) by  nebulization every 4 (four) hours as needed for wheezing or shortness of breath. 01/05/23  Yes McClung, Angela M, PA-C  albuterol (PROVENTIL) (2.5 MG/3ML) 0.083% nebulizer solution Take 3 mLs (2.5 mg total) by nebulization every 4 (four) hours as needed for wheezing or shortness of breath. 08/18/23  Yes Terald Sleeper, MD  albuterol (VENTOLIN HFA) 108 (90 Base) MCG/ACT inhaler Inhale 2 puffs into the lungs every 6 (six) hours as needed for wheezing or shortness of breath. 07/26/23  Yes [name], MD  allopurinol (ZYLOPRIM) 100 MG tablet TAKE 2 TABLETS (200 MG TOTAL) BY MOUTH DAILY. 07/26/23  Yes [name], MD  apixaban (ELIQUIS) 5 MG TABS tablet Take 1 tablet (5 mg total) by mouth 2 (two) times daily. 12/08/22  Yes Laurey Morale, MD  atorvastatin (LIPITOR) 80 MG tablet Take 1 tablet (80 mg total) by mouth daily. Patient taking differently: Take 80 mg by mouth at bedtime. 07/26/23 10/24/23 Yes [name], MD  benzonatate (TESSALON) 100 MG capsule Take 1 capsule (100 mg total) by mouth 3 (three) times daily as needed for up to 21 days for cough. 08/18/23 09/08/23 Yes Persia Lintner, Kermit Balo, MD  carvedilol (COREG) 12.5 MG tablet Take 1 tablet (12.5 mg total) by mouth 2 (two) times daily. 07/26/23  Yes [name], MD  colchicine 0.6 MG tablet Take 0.6 mg by mouth See admin instructions. Take two tablets 1.2 at the  first sign of gout flare, take one tablet 0.6 mg one hour later.   Yes [provider]  dapagliflozin propanediol (FARXIGA) 10 MG TABS tablet Take 1 tablet (10 mg total) by mouth daily. 07/26/23  Yes [name], MD  isosorbide-hydrALAZINE (BIDIL) 20-37.5 MG tablet Take 1 tablet by mouth 3 (three) times daily. 07/26/23  Yes [name], MD  potassium chloride (KLOR-CON) 10 MEQ tablet Take 2 tablets (20 mEq total) by mouth daily. 07/26/23  Yes [name], MD  predniSONE (DELTASONE) 10 MG tablet Take 6 tablets (60 mg total) by mouth daily with  breakfast for 3 days, THEN 4 tablets (40 mg total) daily with breakfast for 2 days, THEN 2 tablets (20 mg total) daily with breakfast for 2 days. 08/19/23 08/26/23 Yes Ceasar Decandia, Kermit Balo, MD  Tiotropium Bromide Monohydrate (SPIRIVA RESPIMAT) 2.5 MCG/ACT AERS Inhale 2 puffs into the lungs daily. 07/26/23  Yes [name], MD  torsemide (DEMADEX) 20 MG tablet Take 20-40 mg by mouth See admin instructions. Take one tablet (20 mg) by mouth on Monday, Wednesday, Friday, Sunday. Take two tablets (40 mg) by mouth on Tuesday, Thursday and Saturday.   Yes [provider]  fluticasone-salmeterol (ADVAIR HFA) 230-21 MCG/ACT inhaler Inhale 2 puffs into the lungs 2 (two) times daily. 07/26/23   [name], MD  mirtazapine (REMERON) 15 MG tablet Take 0.5 tablets (7.5 mg total) by mouth at bedtime. Patient not taking: Reported on 08/18/2023 09/06/23 12/05/23  Park Pope, MD  traZODone (DESYREL) 50 MG tablet Take 50 mg by mouth at bedtime. Patient not taking: Reported on 08/18/2023    [provider]  metoprolol tartrate (LOPRESSOR) 100 MG tablet Take 1 tablet (100 mg total) by mouth 2 (two) times daily. 07/09/20 07/11/20  Marrion Coy, MD      Allergies    Patient has no known allergies.    Review of Systems   Review of Systems  Physical Exam Updated Vital Signs BP (!) 160/98 (BP Location: Right Arm)   Pulse 76   Temp 98.4 F (36.9 C)   Resp 20   SpO2 100%  Physical Exam Constitutional:      General: He is not in acute distress. HENT:     Head: Normocephalic and atraumatic.  Eyes:     Conjunctiva/sclera: Conjunctivae normal.     Pupils: Pupils are equal, round, and reactive to light.  Cardiovascular:     Rate and Rhythm: Normal rate and regular rhythm.  Pulmonary:     Effort: Pulmonary effort is normal. No respiratory distress.     Comments: Inspiratory and expiratory wheezing bilaterally Abdominal:     General: There is no distension.     Tenderness: There is no abdominal  tenderness.  Skin:    General: Skin is warm and dry.  Neurological:     General: No focal deficit present.     Mental Status: He is alert. Mental status is at baseline.  Psychiatric:        Mood and Affect: Mood normal.        Behavior: Behavior normal.     ED Results / Procedures / Treatments   Labs (all labs ordered are listed, but only abnormal results are displayed) Labs Reviewed  BASIC METABOLIC PANEL - Abnormal; Notable for the following components:      Result Value   Glucose, Bld 111 (*)    BUN 45 (*)    Creatinine, Ser 2.34 (*)    Calcium 8.6 (*)  GFR, Estimated 31 (*)    All other components within normal limits  CBC - Abnormal; Notable for the following components:   Platelets 138 (*)    All other components within normal limits  TROPONIN I (HIGH SENSITIVITY) - Abnormal; Notable for the following components:   Troponin I (High Sensitivity) 53 (*)    All other components within normal limits  RESP PANEL BY RT-PCR (RSV, FLU A&B, COVID)  RVPGX2  BRAIN NATRIURETIC PEPTIDE    EKG EKG Interpretation Date/Time:  Thursday August 18 2023 07:25:13 EST Ventricular Rate:  79 PR Interval:  128 QRS Duration:  106 QT Interval:  390 QTC Calculation: 447 R Axis:   -4  Text Interpretation: Normal sinus rhythm T wave abnormality, consider lateral ischemia When compared with ECG of 26-Jul-2023 11:13, PREVIOUS ECG IS PRESENT No significant changes from prior tracing, similar T wave inversions Confirmed by Alvester Chou [phone]) on 08/18/2023 7:32:34 AM  Radiology DG Chest 2 View Result Date: 08/18/2023 CLINICAL DATA:  Chest pain EXAM: CHEST - 2 VIEW COMPARISON:  05/18/2023 FINDINGS: The cardio pericardial silhouette is enlarged. There is pulmonary vascular congestion without overt pulmonary edema. Retrocardiac region of the left base not well evaluated. Otherwise no evidence for focal airspace consolidation. No pleural effusion. No acute bony abnormality. Telemetry leads overlie  the chest. IMPRESSION: Cardiomegaly with low lung volumes. Retrocardiac atelectasis/infiltrate not excluded. No findings to suggest pulmonary edema or pleural effusion. Electronically Signed   By: Kennith Center M.D.   On: 08/18/2023 07:57    Procedures Procedures    Medications Ordered in ED Medications  albuterol (PROVENTIL) (2.5 MG/3ML) 0.083% nebulizer solution (10 mg/hr Nebulization New Bag/Given 08/18/23 0835)  magnesium sulfate IVPB 2 g 50 mL (2 g Intravenous New Bag/Given 08/18/23 0845)  methylPREDNISolone sodium succinate (SOLU-MEDROL) 125 mg/2 mL injection 125 mg (125 mg Intravenous Given 08/18/23 0832)  benzonatate (TESSALON) capsule 100 mg (100 mg Oral Given 08/18/23 0831)    ED Course/ Medical Decision Making/ A&P Clinical Course as of 08/18/23 1017  Thu Aug 18, 2023  0925 Cr near chronic CKD levels, and trop similarly chronically elevated.  Will need 2nd troponin [MT]  1014 Patient is reassessed and is feeling significantly better.  He does have some improvement of his wheezing although he continues to have audible wheezing.  I suspect this is most likely emphysema.  He is not having any active chest pain at this time. He is wanting to leave now, does not want to await 2nd troponin, and I think that is reasonable, as my suspicion for ACS is low; with his initial troponin within normal limits for his mildly elevated levels & known CKD [MT]    Clinical Course User Index [MT] Cyntia Staley, Kermit Balo, MD                                 Medical Decision Making Amount and/or Complexity of Data Reviewed Labs: ordered. Radiology: ordered.  Risk Prescription drug management.   This patient presents to the ED with concern for shortness of breath, coughing, chest pain associated with coughing, near syncope. This involves an extensive number of treatment options, and is a complaint that carries with it a high risk of complications and morbidity.    Patient's coughing may be multifactorial  related to COPD or emphysema exacerbation versus viral URI versus pneumonia versus pleural effusion versus other  I strongly suspect that his syncope or near syncope  episodes are vasovagal in nature, as they specifically occur during coughing fits, and he has had them before.  He had an echocardiogram 1 year ago with no emergent findings.  I also suspect that his chest pain is related to coughing, low suspicion for ACS, but given his age and risk factors reasonable to obtain an EKG and troponin level.  Very low suspicion for acute PE in this clinical setting.  Co-morbidities that complicate the patient evaluation: Significant former smoking history and high risk of emphysema  External records from outside source obtained and reviewed including recent echocardiogram, pulmonology evaluation, left heart catheterization, see history above  I ordered and personally interpreted labs.  The pertinent results include: Patient is chronic kidney disease and creatinine level is within recent ranges; trop is also chronically mildly elevated for the patient; white blood cell count is normal  I ordered imaging studies including x-ray of the chest I independently visualized and interpreted imaging which showed cardiomegaly with low lung volumes, unclear if potential retrocardiac opacity I agree with the radiologist interpretation  The patient was maintained on a cardiac monitor.  I personally viewed and interpreted the cardiac monitored which showed an underlying rhythm of: Sinus rhythm  Per my interpretation the patient's ECG shows this rhythm no acute ischemic findings, chronic T wave inversions unchanged from prior tracing  I ordered medication including DuoNebs, IV steroids and IV magnesium for suspected COPD or emphysema exacerbation and wheezing.  Tessalon for coughing.  I have reviewed the patients home medicines and have made adjustments as needed.  Patient has had multiple CT scans of the lungs as  an outpatient including one 2 months ago.  This was for lung cancer screening primarily.  I do not believe he is requiring another emergent CT scan at this time.  After the interventions noted above, I reevaluated the patient and found that they have: improved  Social Determinants of Health: patient commended on smoking cessation.  I encouraged him to continue  Dispostion:  After consideration of the diagnostic results and the patients response to treatment, I feel that the patent would benefit from close outpatient follow-up         Final Clinical Impression(s) / ED Diagnoses Final diagnoses:  Subacute cough  COPD exacerbation (HCC)    Rx / DC Orders ED Discharge Orders          Ordered    predniSONE (DELTASONE) 10 MG tablet  Q breakfast        08/18/23 1017    benzonatate (TESSALON) 100 MG capsule  3 times daily PRN        08/18/23 1017    albuterol (PROVENTIL) (2.5 MG/3ML) 0.083% nebulizer solution  Every 4 hours PRN        08/18/23 1017              Terald Sleeper, MD 08/18/23 1018

## 2023-08-18 NOTE — Discharge Instructions (Addendum)
 Please follow-up with the results of your COVID and flu testing at home.  You can also follow-up with your doctor's office for recheck of your symptoms this week.  For the next 2 days, I recommend you give yourself albuterol nebulizer breathing treatments once every 4 hours while awake.  After that you can go back to your normal routine.  Your next dose of prednisone steroid is due tomorrow morning 08/19/23 with breakfast.

## 2023-08-18 NOTE — Telephone Encounter (Signed)
 Requested medication (s) are due for refill today:   Not sure  Requested medication (s) are on the active medication list:   Yes from a historical provider when click on it it's Dr Jonah Blue.  Future visit scheduled:   No   Seen 3 weeks ago by Dr. Laural Benes for gout.    He has also seen other providers with [facility] recently.    LOV with Dr. Laural Benes 07/26/2023.    Not sure who his PCP is or who should be ordering this.   Last ordered: 08/17/2023 #15, 1 by historical provider (Dr. Jonah Blue).  Returned because was not sure who is managing his medical conditions, gout, now.   [facility] or [facility].   Requested Prescriptions  Pending Prescriptions Disp Refills   colchicine 0.6 MG tablet      Sig: Take 1 tablet (0.6 mg total) by mouth daily.     Endocrinology:  Gout Agents - colchicine Failed - 08/18/2023 12:18 PM      Failed - Cr in normal range and within 360 days    Creat  Date Value Ref Range Status  01/14/2020 1.97 (H) 0.70 - 1.33 mg/dL Final    Comment:    For patients >50 years of age, the reference limit for Creatinine is approximately 13% higher for people identified as African-American. .    Creatinine, Ser  Date Value Ref Range Status  08/18/2023 2.34 (H) 0.61 - 1.24 mg/dL Final         Passed - ALT in normal range and within 360 days    ALT  Date Value Ref Range Status  05/18/2023 20 0 - 44 U/L Final   SGPT (ALT)  Date Value Ref Range Status  10/20/2013 69 12 - 78 U/L Final         Passed - AST in normal range and within 360 days    AST  Date Value Ref Range Status  05/18/2023 22 15 - 41 U/L Final   SGOT(AST)  Date Value Ref Range Status  10/20/2013 34 15 - 37 Unit/L Final         Passed - Valid encounter within last 12 months    Recent Outpatient Visits           3 weeks ago Type 2 diabetes mellitus with obesity (HCC)   [facility] [place] Jonah Blue B, MD   7 months ago Chronic systolic heart failure [facility])   [facility], Marzella Schlein, PA-C   1 year ago Type 2 diabetes mellitus with obesity [facility])   [facility] [name], MD   1 year ago Hospital discharge follow-up   [facility] [place]. [facility] [name], MD   1 year ago Type 2 diabetes mellitus with obesity [facility])   [facility] [place] [name], MD              Passed - CBC within normal limits and completed in the last 12 months    WBC  Date Value Ref Range Status  08/18/2023 9.6 4.0 - 10.5 K/uL Final   RBC  Date Value Ref Range Status  08/18/2023 4.86 4.22 -  5.81 MIL/uL Final   Hemoglobin  Date Value Ref Range Status  08/18/2023 14.5 13.0 - 17.0 g/dL Final  09/81/1914 78.2 13.0 - 17.7 g/dL Final   HCT  Date Value Ref Range Status  08/18/2023 44.6 39.0 - 52.0 % Final   Hematocrit  Date Value Ref Range Status  11/07/2019 46.2 37.5 - 51.0 % Final   MCHC  Date Value Ref Range Status  08/18/2023 32.5 30.0 - 36.0 g/dL Final   [facility]  Date Value Ref Range Status  08/18/2023 29.8 26.0 - 34.0 pg Final   MCV  Date Value Ref Range Status  08/18/2023 91.8 80.0 - 100.0 fL Final  11/07/2019 90 79 - 97 fL Final  10/21/2013 92 80 - 100 fL Final   No results found for: "PLTCOUNTKUC", "LABPLAT", "POCPLA" RDW  Date Value Ref Range Status  08/18/2023 15.1 11.5 - 15.5 % Final  11/07/2019 14.2 11.6 - 15.4 % Final  10/21/2013 15.7 (H) 11.5 - 14.5 % Final

## 2023-08-18 NOTE — ED Triage Notes (Signed)
 Pt. Stated, Chris [name] had a very hard cough with chest pain , coughing so hard causes me to pass out, and Im SOB, Started 2 days ago

## 2023-08-19 ENCOUNTER — Ambulatory Visit: Payer: Medicaid Other | Admitting: Surgical

## 2023-08-22 ENCOUNTER — Other Ambulatory Visit: Payer: Self-pay | Admitting: *Deleted

## 2023-08-22 NOTE — Patient Outreach (Signed)
 Care Management/Care Coordination  RN Case Manager Case Closure Note  08/22/2023 Name: Chris [name] MRN: [id number] DOB: 1961/06/03  Chris [name] is a 63 y.o. year old male who is a primary care patient of [name], MD. The care management/care coordination team was consulted for assistance with chronic disease management and/or care coordination needs.   Care Plan : RN Care Manager Plan of Care  Updates made by [name], RN since 08/22/2023 12:00 AM  Completed 08/22/2023   Problem: Health Management Needs Related to COPD and Chronic Pain Resolved 08/22/2023  Priority: High     Long-Range Goal: Independent self health management of COPDand Chronic Pain Completed 08/22/2023  Start Date: 07/23/2022  Expected End Date: 06/14/2023  Recent Progress: On track  Priority: High  Note:   Current Barriers:  Knowledge Deficits related to plan of care for management of COPD and Chronic Pain Care Coordination needs related to Transportation  Non-adherence to prescribed medication regimen Difficulty obtaining medications  RNCM Clinical Goal(s):  Patient will take all medications exactly as prescribed and will call provider for medication related questions as evidenced by medication review and patient interview demonstrate Ongoing adherence to prescribed treatment plan for CHF as evidenced by medication review continue to work with RN Care Manager to address care management and care coordination needs related to  CHF as evidenced by adherence to CM Team Scheduled appointments work with Child psychotherapist to address  related to the management of Limited social support, Transportation, and Lack of essential utilities - light/power* related to the management of CHF as evidenced by review of EMR and patient or Child psychotherapist report through collaboration with Medical illustrator, provider, and care team.   Interventions: Inter-disciplinary care team collaboration (see longitudinal plan of  care) Evaluation of current treatment plan related to  self management and patient's adherence to plan as established by provider Reviewed upcoming appointments including:   08/29/23 with Orthopedic Advised patient to take all medications with him to upcoming provider appointments Ensured patient will arrange transportation to upcoming appointments Assisted with contacting [facility] regarding Tessalon Pearls-medication not covered by insurance, patient will pay out of pocket and pick up today Advised patient to schedule follow up with PCP in June Discussed Case Closure, patient aware to contact PCP office if new needs arise   COPD Interventions:  (Status:  Goal Met.) Long Term Goal Advised patient to track and manage COPD triggers Advised patient to engage in light exercise as tolerated 3-5 days a week to aid in the the management of COPD Provided education about and advised patient to utilize infection prevention strategies to reduce risk of respiratory infection Discussed the importance of adequate rest and management of fatigue with COPD Assessed social determinant of health barriers Advised patient on smoking cessation, provided education Discussed patient has quit smoking, no smoking since 07/13/23 Provided encouragement to continue smoking cessation   Pain Interventions:  (Status:  Goal met) Long Term Goal-right hip pain 8/10, left shoulder pain 7/10 Pain assessment performed Medications reviewed: advised patient to contact [facility] and have all medications transferred from [facility] to [facility] Reviewed provider established plan for pain management Discussed importance of adherence to all scheduled medical appointments Counseled on the importance of reporting any/all new or changed pain symptoms or management strategies to pain management provider Reviewed with patient prescribed pharmacological and nonpharmacological pain relief strategies Reviewed  provider notes and discussed Discussed hip injections on 08/29/23   Patient Goals/Self-Care Activities:  Take all medications as prescribed Attend all scheduled provider appointments Call provider office for new concerns or questions  Work with the social worker to address care coordination needs and will continue to work with the clinical team to address health care and disease management related needs call 1-800-273-TALK (toll free, 24 hour hotline) if experiencing a Mental Health or Behavioral Health Crisis   Follow Up Plan:  No further follow up      Plan: The patient has met all care management goals, agreed to case closure, and has been provided with contact information for the care management team. Appropriate care team members and provider have been notified via electronic communication. The care management team is available to at any time in the future should needs arise.   Estanislado Emms RN, BSN [place]  [facility] RN Care Manager [phone]

## 2023-08-23 ENCOUNTER — Ambulatory Visit (INDEPENDENT_AMBULATORY_CARE_PROVIDER_SITE_OTHER): Payer: Medicaid Other

## 2023-08-23 DIAGNOSIS — F1020 Alcohol dependence, uncomplicated: Secondary | ICD-10-CM

## 2023-08-23 NOTE — Progress Notes (Signed)
 THERAPIST PROGRESS NOTE  Session Time: 2:00 pm to 2:07 pm  Virtual Visit via Video Note   I connected with 2:00 pm   EST by a video enabled telemedicine application and verified that I am speaking with the correct person using two identifiers.   Location: Patient: home Provider: [address] [place]   I discussed the limitations of evaluation and management by telemedicine and the availability of in person appointments. The patient expressed understanding and agreed to proceed.                         THERAPIST PROGRESS NOTE  Session Time:  2:00 pm to 2:07 pm  Type of Therapy: Individual   Therapist Response/Interventions: supportive  Treatment Goals addressed:  Template: Substance Use Disorder         Problem: Substance Use     Dates: Start:  07/26/23       Disciplines: Interdisciplinary, PROVIDER        Goal: Angelica will abstain from alcohol and drugs 30/30 days per month based on self-reports and /or UDS or breathalyzer as indicated.     Dates: Start:  07/26/23    Expected End:  01/23/24       Disciplines: Interdisciplinary, PROVIDER             Goal: Bill will decrease his anxiety and depressive symptoms by reporting PHQ-9 and GAD 7 scores of no higher than a 4     Dates: Start:  07/26/23    Expected End:  01/23/24       Disciplines: Interdisciplinary, PROVIDER             Intervention: Therapist will education Arvil about SUD's patterns and consequences of use, relapse risks, the treatment process and types of mutual groups and provide early recovery, and relapse prevention skills.     Dates: Start:  07/26/23                Intervention: Therapist will assist Lamarius in identifying thoughts and behaviors that can contribute to feelings of depression and anxiety     Dates: Start:  07/26/23       Description: Tywan provides verbal permission for this therapist to electronically sign the CARE PLAN        Summary: Rishaan presents virtually today.[name] He is in the  bed.  Therapist inquires as to how he is feeling.  He reports he went to the doctor and thought he had Covid but they told him he had the flu.  He says he has been vomiting, running a high fever, has congestion and a cough.  Morgen's cough became so severe, therapist could not understand him. Therapist asked if he wanted to rescheduled.  He asks if he is allowed to do that.  Therapist explains it is apparent that he needs rest.         Progress Towards Goals: Inon  Suicidal/Homicidal: denies  Plan: Return again on 09-13-23 at 3pm  Diagnosis: Alcohol Use Disorder Cannabis Use Disorder Major Depressive Disorder, recurrent, Moderate PTSD  Collaboration of Care: N/A  Patient/Guardian was advised Release of Information must be obtained prior to any record release in order to collaborate their care with an outside provider. Patient/Guardian was advised if they have not already done so to contact the registration department to sign all necessary forms in order for [place] to release information regarding their care.   Consent: Patient/Guardian gives verbal consent for treatment and assignment  of benefits for services provided during this visit. Patient/Guardian expressed understanding and agreed to proceed.   Remigio Eisenmenger, LMFT, LCAS

## 2023-08-29 ENCOUNTER — Ambulatory Visit: Admitting: Surgical

## 2023-09-02 ENCOUNTER — Telehealth (HOSPITAL_COMMUNITY): Payer: Self-pay

## 2023-09-02 NOTE — Telephone Encounter (Signed)
 Chris [name], Chris [name] calls to see if Chris [name] is in treatment.  Therapist calls in late afternoon to return the call and reaches VM.  Therapist leaves HIPAA compliant VM and requests return call.   Therapist attempts again on 08-05-23 iat 8L30 am and reaches Chris [name].  She provides her number as [phone].  There is a R.O.I on file and she provides Chris [name]'s full name and date of birth.  Chris [name] asks if Chris [name] is engaged in Treatment. Therapist explains she has seen him for a CCA and 3 sessions, however therapist ended the 3rd virtual session as Chris [name] had the flu and was coughing and having difficulty communicating.  He was in the bed.  We have rescheduled to 09-13-23.  Chris [name] says she should be on the case for a while as the former [name]. was promoted and does not carry a case load.  She says they will probably end Chris [name]'s probation in July if he continues to comply with treatment.  Chris Eisenmenger, MS, LMFT, LCAS

## 2023-09-06 ENCOUNTER — Telehealth (HOSPITAL_COMMUNITY): Payer: Self-pay

## 2023-09-06 NOTE — Telephone Encounter (Signed)
 This therapist received a secure email request from Amado Coe with [facility] requesting updates on Chris [name]. Therapist sent the following secure email to her: "There are no further updates than the last update on 09-02-23 for [facility].  [name] had the flu the last session. We connected through a virtual platform, but his coughing was so severe, I ended the session. He was in the bed when he connected to the session.  I am scheduled to see him again on 09-13-23."  Remigio Eisenmenger, MS., LMFT, LCAS 09-06-23

## 2023-09-13 ENCOUNTER — Ambulatory Visit (INDEPENDENT_AMBULATORY_CARE_PROVIDER_SITE_OTHER)

## 2023-09-13 DIAGNOSIS — F1220 Cannabis dependence, uncomplicated: Secondary | ICD-10-CM

## 2023-09-13 DIAGNOSIS — F331 Major depressive disorder, recurrent, moderate: Secondary | ICD-10-CM

## 2023-09-13 DIAGNOSIS — F1020 Alcohol dependence, uncomplicated: Secondary | ICD-10-CM

## 2023-09-13 DIAGNOSIS — F4310 Post-traumatic stress disorder, unspecified: Secondary | ICD-10-CM

## 2023-09-13 NOTE — Progress Notes (Signed)
 THERAPIST PROGRESS NOTE  Session Time: 3:01 pm to 2:54 pm  Virtual Visit via Video Note   I connected with 3:01 pm  EST by a video enabled telemedicine application and verified that I am speaking with the correct person using two identifiers.   Location: Patient: home Provider: [address] [place]   I discussed the limitations of evaluation and management by telemedicine and the availability of in person appointments. The patient expressed understanding and agreed to proceed.                         THERAPIST PROGRESS NOTE  Session Time: 3:00 pm to 2:53 pm  Type of Therapy: Individual   Therapist Response/Interventions: Supportive/CBT, pointed his ratings of his depression and anxiety did not represent normal levels.  Treatment Goals addressed:  Template: Substance Use Disorder         Problem: Substance Use     Dates: Start:  07/26/23       Disciplines: Interdisciplinary, PROVIDER        Goal: Kalief will abstain from alcohol and drugs 30/30 days per month based on self-reports and /or UDS or breathalyzer as indicated.     Dates: Start:  07/26/23    Expected End:  01/23/24       Disciplines: Interdisciplinary, PROVIDER             Goal: Bill will decrease his anxiety and depressive symptoms by reporting PHQ-9 and GAD 7 scores of no higher than a 4     Dates: Start:  07/26/23    Expected End:  01/23/24       Disciplines: Interdisciplinary, PROVIDER             Intervention: Therapist will education Magnus about SUD's patterns and consequences of use, relapse risks, the treatment process and types of mutual groups and provide early recovery, and relapse prevention skills.     Dates: Start:  07/26/23                Intervention: Therapist will assist Laramie in identifying thoughts and behaviors that can contribute to feelings of depression and anxiety     Dates: Start:  07/26/23       Description: Kalon provides verbal permission for this therapist to electronically  sign the CARE PLAN        Summary: Alpheus presents virtually today. Jazier says it's been 3-4 months ago since he used. He rates his anxiety and depression as a "6".  Lyfe says he still is not sleeping. He reports he has difficulty initiating sleep.[name]  He says he has trouble winding down.  He says his mind does not wonder. He says the hydroxyzine helps him a lot. Therapist inquires as to what issue(s) Tarvis would like to focus on in therapy. He responds by saying he does not have anything he feels he needs to discuss. Therapist inquires as to whether or not  Amair feels his trauma history is still affecting his functioning and he says he does not want to discuss it and it's better to leave things in the past.  Therapist discusses his ratings of anxiety and depression as being a "6" on a scale of 1-10 with 10 being the worst and noted it is not back to a normal level.  Aaron says he thinks he is doing well.  Chesney says he would like to continue to take medication but does not feel like he has anything further  to discuss in therapy.  Therapist informs him that he does not need to continue to schedule if he does not feel he has issues to work on in therapy.  He says if therapist wants to write a note saying he is finished, then he would be in agreement with that decision. Therapist explained that if Anddy did not have any issues he wants to work on in therapy, then he can continue with med management and should he ever want to re-engage in therapy, he can contact this therapist to schedule       Progress Towards Goals: Progressing  Suicidal/Homicidal: denies  Plan: Return again on 09-13-23 at 3pm  Diagnosis: Alcohol Use Disorder Cannabis Use Disorder Major Depressive Disorder, recurrent, Moderate PTSD  Collaboration of Care: N/A  Patient/Guardian was advised Release of Information must be obtained prior to any record release in order to collaborate their care with an outside provider.  Patient/Guardian was advised if they have not already done so to contact the registration department to sign all necessary forms in order for [place] to release information regarding their care.   Consent: Patient/Guardian gives verbal consent for treatment and assignment of benefits for services provided during this visit. Patient/Guardian expressed understanding and agreed to proceed.   Remigio Eisenmenger, LMFT, LCAS

## 2023-09-29 ENCOUNTER — Other Ambulatory Visit: Payer: Self-pay | Admitting: Interventional Radiology

## 2023-09-29 DIAGNOSIS — C649 Malignant neoplasm of unspecified kidney, except renal pelvis: Secondary | ICD-10-CM

## 2023-10-11 ENCOUNTER — Telehealth (HOSPITAL_COMMUNITY): Payer: Self-pay

## 2023-10-11 NOTE — Telephone Encounter (Signed)
 Audry Blinks with [facility] emailed this therapist requesting an update on Chris [name].  Therapist responds with secure email with the following message; "I last saw Chris [name] on 09-13-23. During that session, he said that he was doing well, had not used any substances in 3-4 months.  He said he did not feel he had anything further to discuss in therapy. I explained if he got to the place he had issues to discuss to please call and I could reschedule him.  He asked to continue with medication management. He has an upcoming medication management appointment with Dr. Leia Pun on 11-08-23.  Earnie Gola, MS, LMFT, LCAS 10-11-23

## 2023-10-14 ENCOUNTER — Inpatient Hospital Stay: Admission: RE | Admit: 2023-10-14 | Source: Ambulatory Visit

## 2023-10-16 NOTE — Progress Notes (Incomplete)
 This encounter was conducted via the [employer] providing interactive audio and visual communication.  The patient provided verbal consent to conduct a virtual appointment.  The patient was located at their primary residence during this encounter.  Referring Physician(s): Darlynn Elam, MD   Chief Complaint: The patient is seen in virtual video follow up today s/p right renal mass biopsy and microwave ablation 06/23/22  History of present illness: HPI from last clinic visit 03/02/23 Chris [name] is a 63 y.o. male with history of recently diagnosed right renal mass concerning for renal cell carcinoma.  He was admitted at [facility] for acute respiratory failure and due to CKD III received a renal ultrasound on 04/22/22 which incidentally noted a 2.9 cm solid mass.  This was further evaluated by MRI on 04/23/22 which demonstrated an enhancing mass concerning for renal cell carcinoma.   His respiratory symptoms have improved significantly since discharge home.  He denies any symptoms related to his mass including pain, discomfort, hematuria, or any history of urinary tract infections.   Mr. Furrh underwent CT-guided right renal mass biopsy and microwave ablation on 06/23/22.  The procedure was uncomplicated and he was discharged home the same day. 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 Initial follow up MRI was performed on 10/18/22 and this demonstrated successful ablation without residual enhancement.   At his 10/27/22 follow up he had recovered well from the procedure and denied right flank pain, hematuria, dysuria.  He reported overall sluggishness/fatigue and also endorsed significant pain on left side, which is chronic. He described this as just superior and lateral to the left buttock. He stated that when he was in prison during a weightlifting competition he did 70 dead lifts and felt something pop in that area and it has bothered him since.  Interestingly, on a CT abdomen/pelvis from January of this year there are subcutaneous dystrophic appearing calcifications in this region. We discussed repeat imaging in 3 months and I also told him I would contact Dr. Lincoln Renshaw regarding his CT finding in the left buttock given his chronic pain is located in that region.  The patient presents today via tele-health visit to discuss his 02/24/23 MR Abdomen.  No new flank pain, hematuria, or dysuria.  His 02/24/23 MR abdomen showed a successful ablation without residual enhancement. We made plans for a repeat MRI abdomen in 6 months with a clinic visit shortly thereafter. He presents today via virtual video visit to discuss these results.   Past Medical History:  Diagnosis Date   Arrhythmia    atrial flutter   CHF (congestive heart failure) (HCC)    Chronic kidney disease    COPD (chronic obstructive pulmonary disease) (HCC)    Coronary artery disease    Depression    Diabetes mellitus without complication (HCC)    GERD (gastroesophageal reflux disease)    Gout    Hypertension    Influenza A with respiratory manifestations    Mental disorder     Past Surgical History:  Procedure Laterality Date   ANKLE SURGERY     CARDIAC CATHETERIZATION     CARDIOVERSION N/A 07/08/2020   Procedure: CARDIOVERSION;  Surgeon: Michelle Aid, MD;  Location: [facility] ORS;  Service: Cardiovascular;  Laterality: N/A;   COLONOSCOPY WITH PROPOFOL  N/A 11/19/2021   Procedure: COLONOSCOPY WITH PROPOFOL ;  Surgeon: Elois Hair, MD;  Location: [facility];  Service: Gastroenterology;  Laterality: N/A;   HERNIA REPAIR     x2  IR RADIOLOGIST EVAL & MGMT  05/13/2022   IR RADIOLOGIST EVAL & MGMT  10/27/2022   IR RADIOLOGIST EVAL & MGMT  03/02/2023   POLYPECTOMY  11/19/2021   Procedure: POLYPECTOMY;  Surgeon: Elois Hair, MD;  Location: [facility];  Service: Gastroenterology;;   RADIOLOGY WITH ANESTHESIA N/A 06/23/2022   Procedure: Right renal tumor  ablation;  Surgeon: Federico Hopkins, MD;  Location: [facility] OR;  Service: Radiology;  Laterality: N/A;   RIGHT HEART CATH N/A 07/13/2022   Procedure: RIGHT HEART CATH;  Surgeon: Darlis Eisenmenger, MD;  Location: [facility]IVE CV LAB;  Service: Cardiovascular;  Laterality: N/A;   RIGHT/LEFT HEART CATH AND CORONARY ANGIOGRAPHY N/A 11/19/2022   Procedure: RIGHT/LEFT HEART CATH AND CORONARY ANGIOGRAPHY;  Surgeon: Darlis Eisenmenger, MD;  Location: [facility] INVASIVE CV LAB;  Service: Cardiovascular;  Laterality: N/A;   SHOULDER SURGERY     TEE WITHOUT CARDIOVERSION N/A 07/08/2020   Procedure: TRANSESOPHAGEAL ECHOCARDIOGRAM (TEE);  Surgeon: Michelle Aid, MD;  Location: [facility] ORS;  Service: Cardiovascular;  Laterality: N/A;    Allergies: Patient has no known allergies.  Medications: Prior to Admission medications   Medication Sig Start Date End Date Taking? Authorizing Provider  acetaminophen  (TYLENOL ) 500 MG tablet Take 1-2 tablets (500-1,000 mg total) by mouth every 6 (six) hours as needed (pain.). 12/09/22   Dorenda Gandy, MD  albuterol  (PROVENTIL ) (2.5 MG/3ML) 0.083% nebulizer solution Take 3 mLs (2.5 mg total) by nebulization every 4 (four) hours as needed for wheezing or shortness of breath. 01/05/23   Hassie Lint, PA-C  albuterol  (PROVENTIL ) (2.5 MG/3ML) 0.083% nebulizer solution Take 3 mLs (2.5 mg total) by nebulization every 4 (four) hours as needed for wheezing or shortness of breath. 08/18/23   Arvilla Birmingham, MD  albuterol  (VENTOLIN  HFA) 108 (90 Base) MCG/ACT inhaler Inhale 2 puffs into the lungs every 6 (six) hours as needed for wheezing or shortness of breath. 07/26/23   Lawrance Presume, MD  allopurinol  (ZYLOPRIM ) 100 MG tablet TAKE 2 TABLETS (200 MG TOTAL) BY MOUTH DAILY. 07/26/23   Lawrance Presume, MD  apixaban  (ELIQUIS ) 5 MG TABS tablet Take 1 tablet (5 mg total) by mouth 2 (two) times daily. 12/08/22   Darlis Eisenmenger, MD  atorvastatin  (LIPITOR) 80 MG tablet Take 1 tablet (80 mg total)  by mouth daily. Patient taking differently: Take 80 mg by mouth at bedtime. 07/26/23 10/24/23  Lawrance Presume, MD  carvedilol  (COREG ) 12.5 MG tablet Take 1 tablet (12.5 mg total) by mouth 2 (two) times daily. 07/26/23   Lawrance Presume, MD  colchicine  0.6 MG tablet Take 0.5 tablets (0.3 mg total) by mouth three times a week. 08/18/23   Lawrance Presume, MD  dapagliflozin  propanediol (FARXIGA ) 10 MG TABS tablet Take 1 tablet (10 mg total) by mouth daily. 07/26/23   Lawrance Presume, MD  fluticasone -salmeterol (ADVAIR HFA) 230-21 MCG/ACT inhaler Inhale 2 puffs into the lungs 2 (two) times daily. 07/26/23   Lawrance Presume, MD  isosorbide -hydrALAZINE  (BIDIL ) 20-37.5 MG tablet Take 1 tablet by mouth 3 (three) times daily. 07/26/23   Lawrance Presume, MD  mirtazapine  (REMERON ) 15 MG tablet Take 0.5 tablets (7.5 mg total) by mouth at bedtime. 09/06/23 12/05/23  Augusta Blizzard, MD  potassium chloride  (KLOR-CON ) 10 MEQ tablet Take 2 tablets (20 mEq total) by mouth daily. 07/26/23   Lawrance Presume, MD  Tiotropium Bromide  Monohydrate (SPIRIVA  RESPIMAT) 2.5 MCG/ACT AERS Inhale 2 puffs into the lungs daily. 07/26/23  Lawrance Presume, MD  torsemide  (DEMADEX ) 20 MG tablet Take 20-40 mg by mouth See admin instructions. Take one tablet (20 mg) by mouth on Monday, Wednesday, Friday, Sunday. Take two tablets (40 mg) by mouth on Tuesday, Thursday and Saturday.    [provider]  traZODone  (DESYREL ) 50 MG tablet Take 50 mg by mouth at bedtime.    [provider]  metoprolol  tartrate (LOPRESSOR ) 100 MG tablet Take 1 tablet (100 mg total) by mouth 2 (two) times daily. 07/09/20 07/11/20  Donaciano Frizzle, MD     Family History  Problem Relation Age of Onset   Heart disease Father    Diabetes Mother    HIV Brother    Healthy Son    Healthy Daughter     Social History   Socioeconomic History   Marital status: Divorced    Spouse name: Not on file   Number of children: 3   Years of education:  Not on file   Highest education level: High school graduate  Occupational History   Occupation: disability  Tobacco Use   Smoking status: Every Day    Current packs/day: 1.00    Average packs/day: 1 pack/day for 43.0 years (43.0 ttl pk-yrs)    Types: Cigarettes   Smokeless tobacco: Never   Tobacco comments:        6 cigs daily--03/28/2023  Vaping Use   Vaping status: Never Used  Substance and Sexual Activity   Alcohol use: Yes    Alcohol/week: 4.0 standard drinks of alcohol    Types: 4 Shots of liquor per week   Drug use: Yes    Frequency: 21.0 times per week    Types: Marijuana    Comment: last use Cocaine- 03/28/2021. Still using marijuana, last use 06/22/21   Sexual activity: Not on file  Other Topics Concern   Not on file  Social History Narrative   ** Merged History Encounter **       Social Drivers of Health   Financial Resource Strain: High Risk (07/26/2023)   Overall Financial Resource Strain (CARDIA)    Difficulty of Paying Living Expenses: Very hard  Food Insecurity: Food Insecurity Present (07/26/2023)   Hunger Vital Sign    Worried About Running Out of Food in the Last Year: Sometimes true    Ran Out of Food in the Last Year: Sometimes true  Transportation Needs: Unmet Transportation Needs (07/26/2023)   PRAPARE - Administrator, Civil Service (Medical): Yes    Lack of Transportation (Non-Medical): Yes  Physical Activity: Insufficiently Active (11/16/2021)   Exercise Vital Sign    Days of Exercise per Week: 5 days    Minutes of Exercise per Session: 20 min  Stress: Stress Concern Present (07/26/2023)   [employer] of Occupational Health - Occupational Stress Questionnaire    Feeling of Stress : Very much  Social Connections: Moderately Integrated (07/26/2023)   Social Connection and Isolation Panel [NHANES]    Frequency of Communication with Friends and Family: More than three times a week    Frequency of Social Gatherings with Friends and  Family: Once a week    Attends Religious Services: 1 to 4 times per year    Active Member of [employer] or Organizations: Yes    Attends Banker Meetings: Never    Marital Status: Divorced     Vital Signs: There were no vitals taken for this visit.  Physical Exam Patient is alert, oriented and able to participate fully in  the conversation. No apparent discomfort or distress observed. He appears appropriately dressed.    Imaging: MRI Abdomen 04/23/22   Right upper pole enhancing mass 2.8 x 2.5 x 2.3 cm   MRI abdomen 10/18/22  IMPRESSION: 1. Upper pole right renal ablation without residual, locally recurrent, or metastatic disease. 2. Hepatomegaly 3. Renal cysts and Bosniak 2 hemorrhagic/proteinaceous cysts. No specific follow-up indicated. 4. Aortic Atherosclerosis (ICD10-I70.0).    MRI abdomen 02/24/23  Unchanged appearance of ablation site of the peripheral superior pole of the right kidney with adjacent fat necrosis and no associated contrast enhancement.     Labs:  CBC: Recent Labs    12/09/22 0930 12/20/22 1007 05/18/23 0844 08/18/23 0811  WBC 9.4 9.8 9.1 9.6  HGB 14.8 15.8 15.7 14.5  HCT 46.0 50.3 48.1 44.6  PLT 166 171 197 138*    COAGS: No results for input(s): "INR", "APTT" in the last 8760 hours.  BMP: Recent Labs    12/20/22 1007 01/05/23 0903 02/25/23 1120 05/18/23 0844 08/18/23 0811  NA 139 139 138 136 136  K 4.1 4.7 4.5 5.2* 4.6  CL 106 100 105 99 106  CO2 23 22 25 28 22   GLUCOSE 90 69* 75 103* 111*  BUN 29* 61* 41* 38* 45*  CALCIUM  8.9 9.4 8.8* 8.9 8.6*  CREATININE 2.15* 2.73* 2.15* 2.33* 2.34*  GFRNONAA 34*  --  34* 31* 31*    LIVER FUNCTION TESTS: Recent Labs    12/20/22 1007 01/05/23 0903 02/25/23 1120 05/18/23 0844  BILITOT 0.9 0.2 0.6 0.6  AST 11* 23 28 22   ALT 12 15 24 20   ALKPHOS 67 91 88 76  PROT 6.9 7.0 7.2 7.0  ALBUMIN  3.4* 4.1 3.7 3.7    Assessment and Plan:  63 year old male with history of  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 He has recovered well since the procedure. Initial follow up imaging demonstrates successful ablation without residual enhancement.   Electronically Signed: Fawn Hooks 10/16/2023, 11:16 AM   I spent a total of 25 Minutes in virtual video clinical consultation, greater than 50% of which was counseling/coordinating care for renal cell carcinoma.

## 2023-10-19 ENCOUNTER — Inpatient Hospital Stay
Admission: RE | Admit: 2023-10-19 | Discharge: 2023-10-19 | Disposition: A | Discharge status: Home / Self Care | Source: Ambulatory Visit | Attending: Interventional Radiology | Admitting: Interventional Radiology

## 2023-10-26 ENCOUNTER — Ambulatory Visit
Admission: RE | Admit: 2023-10-26 | Discharge: 2023-10-26 | Disposition: A | Discharge status: Home / Self Care | Source: Ambulatory Visit | Attending: Interventional Radiology | Admitting: Interventional Radiology

## 2023-10-26 DIAGNOSIS — C649 Malignant neoplasm of unspecified kidney, except renal pelvis: Secondary | ICD-10-CM

## 2023-10-26 DIAGNOSIS — C641 Malignant neoplasm of right kidney, except renal pelvis: Secondary | ICD-10-CM | POA: Diagnosis not present

## 2023-10-26 MED ORDER — GADOPICLENOL 0.5 MMOL/ML IV SOLN
10.0000 mL | Freq: Once | INTRAVENOUS | Status: AC | PRN
  Administered 2023-10-26: 10 mL via INTRAVENOUS

## 2023-10-28 ENCOUNTER — Ambulatory Visit: Admission: RE | Admit: 2023-10-28 | Source: Ambulatory Visit

## 2023-10-28 NOTE — Progress Notes (Signed)
 This encounter was conducted via the [employer] providing interactive audio and visual communication. The patient provided verbal consent to conduct a virtual appointment. The patient was located at their primary residence during this encounter.  Referring Physician(s): Chris Elam, MD   Chief Complaint: The patient is seen in virtual video follow up today s/p left renal microwave ablation with biopsy 06/23/22  History of present illness: HPI from last clinic visit 03/02/23 [name] is a 63 y.o. male with history of recently diagnosed right renal mass concerning for renal cell carcinoma. He was admitted at [name] for acute respiratory failure and due to CKD III received a renal ultrasound on 04/22/22 which incidentally noted a 2.9 cm solid mass. This was further evaluated by MRI on 04/23/22 which demonstrated an enhancing mass concerning for renal cell carcinoma.   His respiratory symptoms have improved significantly since discharge home.  He denies any symptoms related to his mass including pain, discomfort, hematuria, or any history of urinary tract infections.   [name] underwent CT-guided right renal mass biopsy and microwave ablation on 06/23/22. The procedure was uncomplicated and he was discharged home the same day. 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 Initial follow up MRI was performed on 10/18/22 and this demonstrated successful ablation without residual enhancement.    At his 10/27/22 follow up he had recovered well from the procedure and denied right flank pain, hematuria, dysuria. He reported overall sluggishness/fatigue and also endorsed significant pain on left side, which is chronic. He described this as just superior and lateral to the left buttock. He stated that when he was in prison during a weightlifting competition he did 70 dead lifts and felt something pop in that area and it has bothered him since. Interestingly, on a CT  abdomen/pelvis from January of this year there are subcutaneous dystrophic appearing calcifications in this region. We discussed repeat imaging in 3 months and I also told him I would contact Dr. Lincoln [name] regarding his CT finding in the left buttock given his chronic pain is located in that region.   MRI 02/24/23 showed an unchanged appearance of the ablation site and no evidence of recurrent or metastatic disease in the abdomen. He was still feeling well and had no complaints at our last visit. We made plans for repeat MRI and clinic follow up in 6 months. His MRI was performed 10/26/23 and he presents today via virtual video visit.  No hematuria.  No flank pain.    Past Medical History:  Diagnosis Date   Arrhythmia    atrial flutter   CHF (congestive heart failure) (HCC)    Chronic kidney disease    COPD (chronic obstructive pulmonary disease) (HCC)    Coronary artery disease    Depression    Diabetes mellitus without complication (HCC)    GERD (gastroesophageal reflux disease)    Gout    Hypertension    Influenza A with respiratory manifestations    Mental disorder     Past Surgical History:  Procedure Laterality Date   ANKLE SURGERY     CARDIAC CATHETERIZATION     CARDIOVERSION N/A 07/08/2020   Procedure: CARDIOVERSION;  Surgeon: Chris Aid, MD;  Location: [facility] ORS;  Service: Cardiovascular;  Laterality: N/A;   COLONOSCOPY WITH PROPOFOL  N/A 11/19/2021   Procedure: COLONOSCOPY WITH PROPOFOL ;  Surgeon: [facility] Hair, MD;  Location: [name];  Service: Gastroenterology;  Laterality: N/A;   HERNIA REPAIR     x2  IR RADIOLOGIST EVAL & MGMT  05/13/2022   IR RADIOLOGIST EVAL & MGMT  10/27/2022   IR RADIOLOGIST EVAL & MGMT  03/02/2023   POLYPECTOMY  11/19/2021   Procedure: POLYPECTOMY;  Surgeon: Chris Hair, MD;  Location: [name];  Service: Gastroenterology;;   RADIOLOGY WITH ANESTHESIA N/A 06/23/2022   Procedure: Right renal tumor ablation;  Surgeon: Chris Hopkins, MD;  Location: [facility] OR;  Service: Radiology;  Laterality: N/A;   RIGHT HEART CATH N/A 07/13/2022   Procedure: RIGHT HEART CATH;  Surgeon: Chris Eisenmenger, MD;  Location: [facility] INVASIVE CV LAB;  Service: Cardiovascular;  Laterality: N/A;   RIGHT/LEFT HEART CATH AND CORONARY ANGIOGRAPHY N/A 11/19/2022   Procedure: RIGHT/LEFT HEART CATH AND CORONARY ANGIOGRAPHY;  Surgeon: Chris Eisenmenger, MD;  Location: [facility] INVASIVE CV LAB;  Service: Cardiovascular;  Laterality: N/A;   SHOULDER SURGERY     TEE WITHOUT CARDIOVERSION N/A 07/08/2020   Procedure: TRANSESOPHAGEAL ECHOCARDIOGRAM (TEE);  Surgeon: Chris Aid, MD;  Location: [facility] OR[facility];  Service: Cardiovascular;  Laterality: N/A;    Allergies: Patient has no known allergies.  Medications: Prior to Admission medications   Medication Sig Start Date End Date Taking? Authorizing Provider  acetaminophen  (TYLENOL ) 500 MG tablet Take 1-2 tablets (500-1,000 mg total) by mouth every 6 (six) hours as needed (pain.). 12/09/22   Chris Gandy, MD  albuterol  (PROVENTIL ) (2.5 MG/3ML) 0.083% nebulizer solution Take 3 mLs (2.5 mg total) by nebulization every 4 (four) hours as needed for wheezing or shortness of breath. 01/05/23   Chris Lint, PA-C  albuterol  (PROVENTIL ) (2.5 MG/3ML) 0.083% nebulizer solution Take 3 mLs (2.5 mg total) by nebulization every 4 (four) hours as needed for wheezing or shortness of breath. 08/18/23   Chris Birmingham, MD  albuterol  (VENTOLIN  HFA) 108 [phone] Base) MCG/ACT inhaler Inhale 2 puffs into the lungs every 6 (six) hours as needed for wheezing or shortness of breath. 07/26/23   Chris Presume, MD  allopurinol  (ZYLOPRIM ) 100 MG tablet TAKE 2 TABLETS (200 MG TOTAL) BY MOUTH DAILY. 07/26/23   Chris Presume, MD  apixaban  (ELIQUIS ) 5 MG TABS tablet Take 1 tablet (5 mg total) by mouth 2 (two) times daily. 12/08/22   Chris Eisenmenger, MD  atorvastatin  (LIPITOR) 80 MG tablet Take 1 tablet (80 mg total) by mouth daily. Patient  taking differently: Take 80 mg by mouth at bedtime. 07/26/23 10/24/23  Chris Presume, MD  carvedilol  (COREG ) 12.5 MG tablet Take 1 tablet (12.5 mg total) by mouth 2 (two) times daily. 07/26/23   Chris Presume, MD  colchicine  0.6 MG tablet Take 0.5 tablets (0.3 mg total) by mouth three times a week. 08/18/23   Chris Presume, MD  dapagliflozin  propanediol (FARXIGA ) 10 MG TABS tablet Take 1 tablet (10 mg total) by mouth daily. 07/26/23   Chris Presume, MD  fluticasone -salmeterol (ADVAIR HFA) 230-21 MCG/ACT inhaler Inhale 2 puffs into the lungs 2 (two) times daily. 07/26/23   Chris Presume, MD  isosorbide -hydrALAZINE  (BIDIL ) 20-37.5 MG tablet Take 1 tablet by mouth 3 (three) times daily. 07/26/23   Chris Presume, MD  mirtazapine  (REMERON ) 15 MG tablet Take 0.5 tablets (7.5 mg total) by mouth at bedtime. 09/06/23 12/05/23  Augusta Blizzard, MD  potassium chloride  (KLOR-CON ) 10 MEQ tablet Take 2 tablets (20 mEq total) by mouth daily. 07/26/23   Chris Presume, MD  Tiotropium Bromide  Monohydrate (SPIRIVA  RESPIMAT) 2.5 MCG/ACT AERS Inhale 2 puffs into the lungs daily. 07/26/23  Chris Presume, MD  torsemide  (DEMADEX ) 20 MG tablet Take 20-40 mg by mouth See admin instructions. Take one tablet (20 mg) by mouth on Monday, Wednesday, Friday, Sunday. Take two tablets (40 mg) by mouth on Tuesday, Thursday and Saturday.    [provider]  traZODone  (DESYREL ) 50 MG tablet Take 50 mg by mouth at bedtime.    [provider]  metoprolol  tartrate (LOPRESSOR ) 100 MG tablet Take 1 tablet (100 mg total) by mouth 2 (two) times daily. 07/09/20 07/11/20  Donaciano Frizzle, MD     Family History  Problem Relation Age of Onset   Heart disease Father    Diabetes Mother    HIV Brother    Healthy Son    Healthy Daughter     Social History   Socioeconomic History   Marital status: Divorced    Spouse name: Not on file   Number of children: 3   Years of education: Not on file   Highest  education level: High school graduate  Occupational History   Occupation: disability  Tobacco Use   Smoking status: Every Day    Current packs/day: 1.00    Average packs/day: 1 pack/day for 43.0 years (43.0 ttl pk-yrs)    Types: Cigarettes   Smokeless tobacco: Never   Tobacco comments:        6 cigs daily--03/28/2023  Vaping Use   Vaping status: Never Used  Substance and Sexual Activity   Alcohol use: Yes    Alcohol/week: 4.0 standard drinks of alcohol    Types: 4 Shots of liquor per week   Drug use: Yes    Frequency: 21.0 times per week    Types: Marijuana    Comment: last use Cocaine- 03/28/2021. Still using marijuana, last use 06/22/21   Sexual activity: Not on file  Other Topics Concern   Not on file  Social History Narrative   ** Merged History Encounter **       Social Drivers of Health   Financial Resource Strain: High Risk (07/26/2023)   Overall Financial Resource Strain (CARDIA)    Difficulty of Paying Living Expenses: Very hard  Food Insecurity: Food Insecurity Present (07/26/2023)   Hunger Vital Sign    Worried About Running Out of Food in the Last Year: Sometimes true    Ran Out of Food in the Last Year: Sometimes true  Transportation Needs: Unmet Transportation Needs (07/26/2023)   PRAPARE - Administrator, Civil Service (Medical): Yes    Lack of Transportation (Non-Medical): Yes  Physical Activity: Insufficiently Active (11/16/2021)   Exercise Vital Sign    Days of Exercise per Week: 5 days    Minutes of Exercise per Session: 20 min  Stress: Stress Concern Present (07/26/2023)   [employer] of Occupational Health - Occupational Stress Questionnaire    Feeling of Stress : Very much  Social Connections: Moderately Integrated (07/26/2023)   Social Connection and Isolation Panel [NHANES]    Frequency of Communication with Friends and Family: More than three times a week    Frequency of Social Gatherings with Friends and Family: Once a week     Attends Religious Services: 1 to 4 times per year    Active Member of [employer] or Organizations: Yes    Attends Banker Meetings: Never    Marital Status: Divorced     Vital Signs: There were no vitals taken for this visit.  Physical Exam Patient is alert, oriented and able to participate fully in  the conversation. No apparent discomfort or distress observed. He appears appropriately dressed.   Imaging:  MRI Abdomen 04/23/22   Right upper pole enhancing mass 2.8 x 2.5 x 2.3 cm   MRI abdomen 10/18/22  IMPRESSION: 1. Upper pole right renal ablation without residual, locally recurrent, or metastatic disease. 2. Hepatomegaly 3. Renal cysts and Bosniak 2 hemorrhagic/proteinaceous cysts. No specific follow-up indicated. 4. Aortic Atherosclerosis (ICD10-I70.0).    MRI abdomen 02/24/23  Unchanged appearance of ablation site of the peripheral superior pole of the right kidney with adjacent fat necrosis and no associated contrast enhancement.   MR Abdomen 10/26/23   Unchanged ablation site of the peripheral superior pole of the right kidney with associated fat necrosis. No suspicious residual or recurrent contrast enhancement.  Labs:  CBC: Recent Labs    12/09/22 0930 12/20/22 1007 05/18/23 0844 08/18/23 0811  WBC 9.4 9.8 9.1 9.6  HGB 14.8 15.8 15.7 14.5  HCT 46.0 50.3 48.1 44.6  PLT 166 171 197 138*    COAGS: No results for input(s): "INR", "APTT" in the last 8760 hours.  BMP: Recent Labs    12/20/22 1007 01/05/23 0903 02/25/23 1120 05/18/23 0844 08/18/23 0811  NA 139 139 138 136 136  K 4.1 4.7 4.5 5.2* 4.6  CL 106 100 105 99 106  CO2 23 22 25 28 22   GLUCOSE 90 69* 75 103* 111*  BUN 29* 61* 41* 38* 45*  CALCIUM  8.9 9.4 8.8* 8.9 8.6*  CREATININE 2.15* 2.73* 2.15* 2.33* 2.34*  GFRNONAA 34*  --  34* 31* 31*    LIVER FUNCTION TESTS: Recent Labs    12/20/22 1007 01/05/23 0903 02/25/23 1120 05/18/23 0844  BILITOT 0.9 0.2 0.6 0.6  AST 11*  23 28 22   ALT 12 15 24 20   ALKPHOS 67 91 88 76  PROT 6.9 7.0 7.2 7.0  ALBUMIN  3.4* 4.1 3.7 3.7    Assessment and Plan: 63 year old male with history of 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 He has recovered well since the procedure.  Surveillance imaging demonstrates post-ablative changes without evidence of recurrence.  Follow up in 1 year with repeat MRI abdomen.  Creasie Doctor, MD Pager: [phone]    I spent a total of 25 Minutes in virtual video clinical consultation, greater than 50% of which was counseling/coordinating care for renal cell carcinoma.

## 2023-10-31 ENCOUNTER — Ambulatory Visit
Admission: RE | Admit: 2023-10-31 | Discharge: 2023-10-31 | Disposition: A | Discharge status: Home / Self Care | Source: Ambulatory Visit | Attending: Interventional Radiology | Admitting: Interventional Radiology

## 2023-10-31 DIAGNOSIS — C641 Malignant neoplasm of right kidney, except renal pelvis: Secondary | ICD-10-CM | POA: Diagnosis not present

## 2023-10-31 DIAGNOSIS — C649 Malignant neoplasm of unspecified kidney, except renal pelvis: Secondary | ICD-10-CM

## 2023-11-04 ENCOUNTER — Telehealth: Payer: Self-pay | Admitting: Family

## 2023-11-04 NOTE — Telephone Encounter (Signed)
 Called to confirm/remind patient of [facility] appointment [facility] on [facility].   Appointment:   [x] Confirmed  [] Left mess   [] No answer/No voice mail  [] VM Full/unable to leave message  [] Phone not in service  Patient reminded to bring all medications and/or complete list.  Confirmed patient has transportation. Gave directions, instructed to utilize valet parking.

## 2023-11-07 NOTE — Progress Notes (Deleted)
 BH MD Outpatient Progress Note  11/08/2023 4:59 PM Chris [name]  MRN:  [id number]  Assessment:  Patient reports improved mood and anxiety since improvement in sleep. He has stopped taking trazodone  but continues to sleep well with hydroxyzine . He did not follow up in regards to his CPAP and OSA but stated he'd follow up with PCP. He has been taking mirtazapine  15 mg every other day, but we discussed needing daily use in order to address depression or anxiety. Should he like to use purely for sedation, we can opt for a low dose of it PRN. Patient to follow up in 3 months to evaluate for progress.  He has been seeing Earnie Gola for substance use counseling which we will discuss next visit and assess if need for naltrexone or further adjustments to medications at that point.  Identifying Information: Chris [name] is a 63 y.o. y.o. male with a history of depression, hypertension, obesity, HFrEF (initial EF 25%, normalized to 50%) atrial flutter s/p DCCV 06/2020, COPD, current smoker x40+ years, former cocaine use x25+ years, alcohol use disorder, cannabis use disorder, HCM, CKD who is an established patient with [facility] for medication management.   Plan: # Major Depressive Disorder-recurrent episode, moderate Past medication trials:  Status of problem: remission Interventions: -- Decrease mirtazapine  to 7.5 mg at bedtime for depression, insomnia, and appetite stimulation -- Continue hydroxyzine  25 mg tid prn for anxiety -- Continue hydroxyzine  50 mg at bedtime as needed for sleep -- Stop trazodone  100 mg nightly for sleep  Alcohol Use -Continue to monitor and advised cessation  #Nicotine  use disorder #Cannabis use disorder --Encourage cessation   Patient was given contact information for behavioral health clinic and was instructed to call 911 for emergencies.   Subjective:  Chief Complaint:  Medication management  Interval History:  Patient reports  depression and anxiety has been in remission since last visit.  He feels that he has been eating well and sleeping well.  He has stopped taking trazodone  and does not feel any major changes to his sleep.  He reports appetite is fair.  He reports he has been doing more outdoor activities which has improved his mood as well as provided him some distraction from internal rumination as well as isolation.  He has been taking mirtazapine  as needed approximately every other day but takes hydroxyzine  every night as well as as needed for anxiety.  All questions were addressed   Visit Diagnosis:  No diagnosis found.      Past Psychiatric History:  Diagnoses: none Medication trials: duloxetine  Previous psychiatrist/therapist: none Hospitalizations: none Suicide attempts: denies SIB: denies Hx of violence towards others: denies Current access to guns: denies  Past Medical History:  Past Medical History:  Diagnosis Date   Arrhythmia    atrial flutter   CHF (congestive heart failure) (HCC)    Chronic kidney disease    COPD (chronic obstructive pulmonary disease) (HCC)    Coronary artery disease    Depression    Diabetes mellitus without complication (HCC)    GERD (gastroesophageal reflux disease)    Gout    Hypertension    Influenza A with respiratory manifestations    Mental disorder     Past Surgical History:  Procedure Laterality Date   ANKLE SURGERY     CARDIAC CATHETERIZATION     CARDIOVERSION N/A 07/08/2020   Procedure: CARDIOVERSION;  Surgeon: Michelle Aid, MD;  Location: [facility] ORS;  Service: Cardiovascular;  Laterality: N/A;  COLONOSCOPY WITH PROPOFOL  N/A 11/19/2021   Procedure: COLONOSCOPY WITH PROPOFOL ;  Surgeon: Elois Hair, MD;  Location: [facility];  Service: Gastroenterology;  Laterality: N/A;   HERNIA REPAIR     x2   IR RADIOLOGIST EVAL & MGMT  05/13/2022   IR RADIOLOGIST EVAL & MGMT  10/27/2022   IR RADIOLOGIST EVAL & MGMT  03/02/2023   IR RADIOLOGIST  EVAL & MGMT  10/31/2023   POLYPECTOMY  11/19/2021   Procedure: POLYPECTOMY;  Surgeon: Elois Hair, MD;  Location: [facility];  Service: Gastroenterology;;   RADIOLOGY WITH ANESTHESIA N/A 06/23/2022   Procedure: Right renal tumor ablation;  Surgeon: Federico Hopkins, MD;  Location: [facility] OR;  Service: Radiology;  Laterality: N/A;   RIGHT HEART CATH N/A 07/13/2022   Procedure: RIGHT HEART CATH;  Surgeon: Darlis Eisenmenger, MD;  Location: [facility] INVASIVE CV LAB;  Service: Cardiovascular;  Laterality: N/A;   RIGHT/LEFT HEART CATH AND CORONARY ANGIOGRAPHY N/A 11/19/2022   Procedure: RIGHT/LEFT HEART CATH AND CORONARY ANGIOGRAPHY;  Surgeon: Darlis Eisenmenger, MD;  Location: [facility] INVASIVE CV LAB;  Service: Cardiovascular;  Laterality: N/A;   SHOULDER SURGERY     TEE WITHOUT CARDIOVERSION N/A 07/08/2020   Procedure: TRANSESOPHAGEAL ECHOCARDIOGRAM (TEE);  Surgeon: Michelle Aid, MD;  Location: [facility] ORS;  Service: Cardiovascular;  Laterality: N/A;    Family History:  Family History  Problem Relation Age of Onset   Heart disease Father    Diabetes Mother    HIV Brother    Healthy Son    Healthy Daughter     Social History:  Social History   Socioeconomic History   Marital status: Divorced    Spouse name: Not on file   Number of children: 3   Years of education: Not on file   Highest education level: High school graduate  Occupational History   Occupation: disability  Tobacco Use   Smoking status: Every Day    Current packs/day: 1.00    Average packs/day: 1 pack/day for 43.0 years (43.0 ttl pk-yrs)    Types: Cigarettes   Smokeless tobacco: Never   Tobacco comments:        6 cigs daily--03/28/2023  Vaping Use   Vaping status: Never Used  Substance and Sexual Activity   Alcohol use: Yes    Alcohol/week: 4.0 standard drinks of alcohol    Types: 4 Shots of liquor per week   Drug use: Yes    Frequency: 21.0 times per week    Types: Marijuana    Comment: last use Cocaine- 03/28/2021.  Still using marijuana, last use 06/22/21   Sexual activity: Not on file  Other Topics Concern   Not on file  Social History Narrative   ** Merged History Encounter **       Social Drivers of Health   Financial Resource Strain: High Risk (07/26/2023)   Overall Financial Resource Strain (CARDIA)    Difficulty of Paying Living Expenses: Very hard  Food Insecurity: Food Insecurity Present (07/26/2023)   Hunger Vital Sign    Worried About Running Out of Food in the Last Year: Sometimes true    Ran Out of Food in the Last Year: Sometimes true  Transportation Needs: Unmet Transportation Needs (07/26/2023)   PRAPARE - Administrator, Civil Service (Medical): Yes    Lack of Transportation (Non-Medical): Yes  Physical Activity: Insufficiently Active (11/16/2021)   Exercise Vital Sign    Days of Exercise per Week: 5 days    Minutes  of Exercise per Session: 20 min  Stress: Stress Concern Present (07/26/2023)   [employer] of Occupational Health - Occupational Stress Questionnaire    Feeling of Stress : Very much  Social Connections: Moderately Integrated (07/26/2023)   Social Connection and Isolation Panel [NHANES]    Frequency of Communication with Friends and Family: More than three times a week    Frequency of Social Gatherings with Friends and Family: Once a week    Attends Religious Services: 1 to 4 times per year    Active Member of [employer] or Organizations: Yes    Attends Banker Meetings: Never    Marital Status: Divorced    Allergies: No Known Allergies  Current Medications: Current Outpatient Medications  Medication Sig Dispense Refill   acetaminophen  (TYLENOL ) 500 MG tablet Take 1-2 tablets (500-1,000 mg total) by mouth every 6 (six) hours as needed (pain.). 30 tablet 0   albuterol  (PROVENTIL ) (2.5 MG/3ML) 0.083% nebulizer solution Take 3 mLs (2.5 mg total) by nebulization every 4 (four) hours as needed for wheezing or shortness of breath. 300 mL 2    albuterol  (PROVENTIL ) (2.5 MG/3ML) 0.083% nebulizer solution Take 3 mLs (2.5 mg total) by nebulization every 4 (four) hours as needed for wheezing or shortness of breath. 75 mL 6   albuterol  (VENTOLIN  HFA) 108 (90 Base) MCG/ACT inhaler Inhale 2 puffs into the lungs every 6 (six) hours as needed for wheezing or shortness of breath. 54 g 6   allopurinol  (ZYLOPRIM ) 100 MG tablet TAKE 2 TABLETS (200 MG TOTAL) BY MOUTH DAILY. 60 tablet 6   apixaban  (ELIQUIS ) 5 MG TABS tablet Take 1 tablet (5 mg total) by mouth 2 (two) times daily. 60 tablet 11   atorvastatin  (LIPITOR) 80 MG tablet Take 1 tablet (80 mg total) by mouth daily. (Patient taking differently: Take 80 mg by mouth at bedtime.) 90 tablet 1   carvedilol  (COREG ) 12.5 MG tablet Take 1 tablet (12.5 mg total) by mouth 2 (two) times daily. 180 tablet 3   colchicine  0.6 MG tablet Take 0.5 tablets (0.3 mg total) by mouth three times a week. 15 tablet 2   dapagliflozin  propanediol (FARXIGA ) 10 MG TABS tablet Take 1 tablet (10 mg total) by mouth daily. 90 tablet 3   fluticasone -salmeterol (ADVAIR HFA) 230-21 MCG/ACT inhaler Inhale 2 puffs into the lungs 2 (two) times daily. 1 each 12   isosorbide -hydrALAZINE  (BIDIL ) 20-37.5 MG tablet Take 1 tablet by mouth 3 (three) times daily. 270 tablet 3   mirtazapine  (REMERON ) 15 MG tablet Take 0.5 tablets (7.5 mg total) by mouth at bedtime. 45 tablet 0   potassium chloride  (KLOR-CON ) 10 MEQ tablet Take 2 tablets (20 mEq total) by mouth daily. 180 tablet 2   Tiotropium Bromide  Monohydrate (SPIRIVA  RESPIMAT) 2.5 MCG/ACT AERS Inhale 2 puffs into the lungs daily. 4 g 6   torsemide  (DEMADEX ) 20 MG tablet Take 20-40 mg by mouth See admin instructions. Take one tablet (20 mg) by mouth on Monday, Wednesday, Friday, Sunday. Take two tablets (40 mg) by mouth on Tuesday, Thursday and Saturday.     traZODone  (DESYREL ) 50 MG tablet Take 50 mg by mouth at bedtime.     No current facility-administered medications for this visit.     ROS:   Objective:  Psychiatric Specialty Exam: There were no vitals taken for this visit.There is no height or weight on file to calculate BMI.  General Appearance: Casual  Eye Contact:  Good  Speech:  Clear and Coherent  Volume:  Normal  Mood:  Anxious and Depressed  Affect:  Appropriate and Constricted  Thought Process:  Coherent, Goal Directed, and Linear  Orientation:  Full (Time, Place, and Person)  Thought Content: Logical   Suicidal Thoughts:  No  Homicidal Thoughts:  No  Memory:  Negative  Judgment:  Intact  Insight:  Fair  Psychomotor Activity:  Normal  Concentration:  Concentration: Fair              Assets:  Communication Skills Desire for Improvement Financial Resources/Insurance Housing  ADL's:  Intact  Cognition: WNL  Sleep:  Poor   PE: General: fatigued, fairly groomed Pulm: no increased work of breathing on room air  Strength & Muscle Tone: within normal limits Neuro: no focal neurological deficits observed  Gait & Station: slight limp when walking  Metabolic Disorder Labs: Lab Results  Component Value Date   HGBA1C 6.6 07/26/2023   MPG 131.24 04/20/2022   MPG 151.33 02/09/2021   No results found for: "PROLACTIN" Lab Results  Component Value Date   CHOL 155 02/25/2023   TRIG 376 (H) 02/25/2023   HDL 27 (L) 02/25/2023   CHOLHDL 5.7 02/25/2023   VLDL 75 (H) 02/25/2023   LDLCALC 53 02/25/2023   LDLCALC 125 (H) 12/22/2022   Lab Results  Component Value Date   TSH 1.592 10/26/2019   TSH 0.842 11/30/2018    Therapeutic Level Labs: No results found for: "LITHIUM" No results found for: "VALPROATE" No results found for: "CBMZ"  Screenings: AUDIT    Flowsheet Row [facility]  Alcohol Use Disorder Identification Test Final Score (AUDIT) 1      GAD-7    Flowsheet Row Office Visit from 07/26/2023 in [facility] [place] - A Dept Of [place].  [facility] Most recent reading at 07/26/2023  3:10 PM Counselor from 07/26/2023 in [facility] Most recent reading at 07/26/2023  3:07 PM Office Visit from 01/05/2023 in [facility] Most recent reading at 01/05/2023  8:40 AM Counselor from 09/23/2022 in [facility] Most recent reading at 09/23/2022  3:39 PM Office Visit from 08/09/2022 in [facility] - A Dept Of [place]. [facility] Most recent reading at 08/09/2022  9:54 AM  Total GAD-7 Score 14 6 0 20 15      PHQ2-9    Flowsheet Row Office Visit from 07/26/2023 in [facility] [place] - A Dept Of [place]. [facility] Most recent reading at 07/26/2023  3:09 PM Counselor from 07/26/2023 in [facility] Most recent reading at 07/26/2023  3:06 PM Office Visit from 01/05/2023 in [facility] Most recent reading at 01/05/2023  8:40 AM Counselor from 09/23/2022 in [facility] Most recent reading at 09/23/2022  3:40 PM Office Visit from 08/26/2022 in [facility] Most recent reading at 08/26/2022  1:38 PM  PHQ-2 Total Score 3 4 0 6 5  PHQ-9 Total Score 12 10 -- 21 17      Flowsheet Row ED from 08/18/2023 in [facility] Department at [facility] ED from 06/16/2023 in [facility] Emergency Department at [facility] ED from 05/18/2023 in [facility] Emergency Department at [facility]  C-SSRS RISK CATEGORY No Risk No Risk No Risk       Collaboration of Care: Collaboration of  Care:   Patient/Guardian was advised Release of Information must be obtained prior to any record release in order to collaborate their care with an outside provider. Patient/Guardian was advised if they have not already done so to contact the registration department to sign all necessary forms in order for  us  to release information regarding their care.   Consent: Patient/Guardian gives verbal consent for treatment and assignment of benefits for services provided during this visit. Patient/Guardian expressed understanding and agreed to proceed.   A total of 30 minutes was spent involved in face to face clinical care, chart review, and documentation.   Augusta Blizzard, MD 11/07/2023, 4:59 PM

## 2023-11-08 ENCOUNTER — Encounter (HOSPITAL_COMMUNITY): Payer: Medicaid Other | Admitting: Student

## 2023-11-08 ENCOUNTER — Ambulatory Visit: Attending: Cardiology | Admitting: Cardiology

## 2023-11-08 VITALS — BP 147/93 | HR 71 | Wt 273.0 lb

## 2023-11-08 DIAGNOSIS — I5032 Chronic diastolic (congestive) heart failure: Secondary | ICD-10-CM | POA: Diagnosis not present

## 2023-11-08 DIAGNOSIS — Z5982 Transportation insecurity: Secondary | ICD-10-CM | POA: Diagnosis not present

## 2023-11-08 DIAGNOSIS — J449 Chronic obstructive pulmonary disease, unspecified: Secondary | ICD-10-CM | POA: Diagnosis not present

## 2023-11-08 DIAGNOSIS — Z7901 Long term (current) use of anticoagulants: Secondary | ICD-10-CM | POA: Insufficient documentation

## 2023-11-08 DIAGNOSIS — I2510 Atherosclerotic heart disease of native coronary artery without angina pectoris: Secondary | ICD-10-CM | POA: Diagnosis not present

## 2023-11-08 DIAGNOSIS — Z85528 Personal history of other malignant neoplasm of kidney: Secondary | ICD-10-CM | POA: Diagnosis not present

## 2023-11-08 DIAGNOSIS — E1122 Type 2 diabetes mellitus with diabetic chronic kidney disease: Secondary | ICD-10-CM | POA: Diagnosis not present

## 2023-11-08 DIAGNOSIS — Z7984 Long term (current) use of oral hypoglycemic drugs: Secondary | ICD-10-CM | POA: Diagnosis not present

## 2023-11-08 DIAGNOSIS — I5042 Chronic combined systolic (congestive) and diastolic (congestive) heart failure: Secondary | ICD-10-CM | POA: Diagnosis not present

## 2023-11-08 DIAGNOSIS — I4892 Unspecified atrial flutter: Secondary | ICD-10-CM | POA: Insufficient documentation

## 2023-11-08 DIAGNOSIS — N183 Chronic kidney disease, stage 3 unspecified: Secondary | ICD-10-CM | POA: Diagnosis not present

## 2023-11-08 DIAGNOSIS — I130 Hypertensive heart and chronic kidney disease with heart failure and stage 1 through stage 4 chronic kidney disease, or unspecified chronic kidney disease: Secondary | ICD-10-CM | POA: Diagnosis not present

## 2023-11-08 DIAGNOSIS — Z87891 Personal history of nicotine dependence: Secondary | ICD-10-CM | POA: Diagnosis not present

## 2023-11-08 DIAGNOSIS — Z5986 Financial insecurity: Secondary | ICD-10-CM | POA: Insufficient documentation

## 2023-11-08 DIAGNOSIS — Z79899 Other long term (current) drug therapy: Secondary | ICD-10-CM | POA: Insufficient documentation

## 2023-11-08 MED ORDER — CARVEDILOL 12.5 MG PO TABS: 18.7500 mg | ORAL_TABLET | Freq: Two times a day (BID) | ORAL | 1 refills | Status: DC

## 2023-11-08 NOTE — Patient Instructions (Signed)
 Medication Changes:  INCREASE Carvedilol  (Coreg ) to 18.75 (1 and a half tablets) two times daily.   Lab Work:  Labs done today, your results will be available in MyChart, we will contact you for abnormal readings.   Referral: You have been referred for a sleep study. Someone from the hospital will call you to schedule this.    Special Instructions // Education:  Do the following things EVERYDAY: Weigh yourself in the morning before breakfast. Write it down and keep it in a log. Take your medicines as prescribed Eat low salt foods--Limit salt (sodium) to 2000 mg per day.  Stay as active as you can everyday Limit all fluids for the day to less than 2 liters   Follow-Up in: 3 months with Dr. Mclean. Please call our office in 1 month to schedule this appointment.    If you have any questions or concerns before your next appointment please send us  a message through [place] or call our office at [phone], If it is after office hours your call will be answered by our answering service and directed appropriately.     At the [facility], you and your health needs are our priority. We have a designated team specialized in the treatment of Heart Failure. This Care Team includes your primary Heart Failure Specialized Cardiologist (physician), Advanced Practice Providers (APPs- Physician Assistants and Nurse Practitioners), and Pharmacist who all work together to provide you with the care you need, when you need it.   You may see any of the following providers on your designated Care Team at your next follow up:  Dr. Jules Oar Dr. Peder Bourdon Dr. Alwin Baars Dr. Judyth Nunnery Nieves Bars, NP [place]dy Cor[place], [place] [address] [place], [place] Dennise Fitz, NP [place] Lee, NP [place] Dellen, NP Bevely Brush, PharmD

## 2023-11-08 NOTE — Progress Notes (Signed)
 PCP: Lawrance Presume, MD HF Cardiology: Dr. Mitzie Anda  63 y.o. with history of HFpEF, CKD stage 3, possible hypertrophic cardiomyopathy, renal cell CA s/p ablation, and atrial flutter s/p 1/22 DCCV who was referred to CHF MD clinic by [facility].  Patient was admitted in 1/22 with CHF and found to be in atrial flutter.  Echo showed EF 25% and he was cardioverted to NSR.  He is no longer anticoagulated. Cardiolite in 2/23 showed no ischemia, fixed inferior defect.  Echo in 8/23 showed EF 50% with severe asymmetric septal hypertrophy but no SAM or LVOT gradient.  Cardiac MRI in 12/23 was similar with LV EF 50%, severe asymmetric septal hypertrophy, normal RV, LGE in the basal septum. These studies were concerning for hypertrophic cardiomyopathy.  Patient is an active smoker and used to use cocaine.  He carries history of COPD.  Grandfather had sudden cardiac death at work ("fell out dead").   RHC in 2023-07-28 showed elevated filling pressures, CI 2.25, PVR 3.9 WU.  Torsemide  was increased.    Patient was seen in the ER in 2/6 with abdominal pain, nausea/vomiting.  At that time, creatinine was up to 2.54.   Zio monitor done 2/24 for episodes of ?syncope showed no AF/AFL, 1.5 % PVCs, 6 short NSVT runs (longest was 7 beats).   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  PKP2 gene is associated with ARVC and Brugada, but the clinical significance of the mutation found in this patient is uncertain (not reported in literature).  Patient was seen by Dr. Drexel Gentles for genetics counseling, she did not think that these mutations were likely to be malignant.   Echo in 5/24 showed EF 45-50%, moderate asymmetric septal hypertrophy with no LVOT gradient of mitral valve SAM, RV normal, mild-moderate MR, IVC normal.   LHC/RHC was done  in 6/24, showing 65% proximal RCA stenosis, normal filling pressures but low CI at 1.81.   Patient was seen in the ER in 7/24 due to severe low back pain.  UDS was positive for cocaine.  He says this is the first time in years he has used it, due to stress from court case and back pain.   He saw Dr. Arlester Ladd with EP, ICD placement was discussed for HCM, he was a borderline candidate and ended up deciding that he did not want ICD.   He returns today for followup of CHF and suspected HCM.  He has quit smoking.  He is short of breath walking about 100 yards.  Does fine around the house.  Not very active.  No chest pain. No lightheadedness or palpitations.  He snores and has daytime sleepiness. BP is elevated today.    ECG (personally reviewed): NSR, LVH with repolarization abnormality.   Labs 07-28-23): BNP 873, K 3.6, creatinine 1.7 Labs (2/24): K 4, creatinine 2.54 Labs (4/24): K 4.2, creatinine 1.75, BNP 280, hgb 14.4 Labs (7/24): K 4.1, creatinine 2.15 => 2.73, BNP 1107, LDL 125 Labs (9/24): LDL 53 Labs (12/24): K 5.2, creatinine 2.33 Labs (3/25): BNP 98, K 4.6, creatinine 2.34  PMH: 1. Renal cell carcinoma on right: s/p ablation 2023-07-28.  2. HF with mid range EF: ?HCM, ?cardiomyopathy related to cocaine in the past.  EF low in the past, echo in 1/22 with EF 22%.   -  Cardiolite (2/23): Fixed inferior defect, ?artifact.  - Echo (8/23): EF 50%, severe asymmetric septal hypertrophy, no SAM/MR, no LVOT gradient, RV normal.  - Cardiac MRI (12/23): LV EF 50%, severe asymmetric septal hypertrophy, normal RV, LGE in the basal septum.  Possible hypertrophic cardiomyopathy.  - RHC (1/24): mean RA 15, PA 50/31 mean 42, mean PCWP 21, CI 2.25, PVR 3.9 WU, PAPi 1.3 - Zio monitor done 2/24 showed no AF/AFL, 1.5 % PVCs, 6 short NSVT runs (longest was 7 beats).  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 MYBPC3 gene is associated with HCM but the clinical  significance of the mutation found in this patient is uncertain (not reported in literature).  PKP2 gene is associated with ARVC and Brugada, but the clinical significance of the mutation found in this patient is uncertain (not reported in literature).  Seen by Dr. Drexel Gentles, mutations not thought to be clinically significant.  - Echo (5/24): EF 45-50%, moderate asymmetric septal hypertrophy with no LVOT gradient of mitral valve SAM, RV normal, mild-moderate MR, IVC normal. - [facility] (6/24): 65% proximal RCA stenosis; mean RA 5, PA 32/12 mean 24, mean PCWP 7,CI 1.81. 3. Atrial flutter: DCCV 1/22.   4. Type 2 diabetes.  5. HTN 6. CKD stage 3 7. Gout 8. Depression 9. COPD: Active smoker.  10. Prior cocaine abuse.   Social History   Socioeconomic History   Marital status: Divorced    Spouse name: Not on file   Number of children: 3   Years of education: Not on file   Highest education level: High school graduate  Occupational History   Occupation: disability  Tobacco Use   Smoking status: Every Day    Current packs/day: 1.00    Average packs/day: 1 pack/day for 43.0 years (43.0 ttl pk-yrs)    Types: Cigarettes   Smokeless tobacco: Never   Tobacco comments:        6 cigs daily--03/28/2023  Vaping Use   Vaping status: Never Used  Substance and Sexual Activity   Alcohol use: Yes    Alcohol/week: 4.0 standard drinks of alcohol    Types: 4 Shots of liquor per week   Drug use: Yes    Frequency: 21.0 times per week    Types: Marijuana    Comment: last use Cocaine- 03/28/2021. Still using marijuana, last use 06/22/21   Sexual activity: Not on file  Other Topics Concern   Not on file  Social History Narrative   ** Merged History Encounter **       Social Drivers of Health   Financial Resource Strain: High Risk (07/26/2023)   Overall Financial Resource Strain (CARDIA)    Difficulty of Paying Living Expenses: Very hard  Food Insecurity: Food Insecurity Present (07/26/2023)   Hunger  Vital Sign    Worried About Running Out of Food in the Last Year: Sometimes true    Ran Out of Food in the Last Year: Sometimes true  Transportation Needs: Unmet Transportation Needs (07/26/2023)   PRAPARE - Administrator, Civil Service (Medical): Yes    Lack of Transportation (Non-Medical): Yes  Physical Activity: Insufficiently Active (11/16/2021)   Exercise Vital Sign    Days of Exercise per Week: 5 days    Minutes of Exercise per Session: 20 min  Stress: Stress Concern Present (07/26/2023)   [employer] of Occupational Health - Occupational Stress Questionnaire    Feeling of Stress : Very  much  Social Connections: Moderately Integrated (07/26/2023)   Social Connection and Isolation Panel [NHANES]    Frequency of Communication with Friends and Family: More than three times a week    Frequency of Social Gatherings with Friends and Family: Once a week    Attends Religious Services: 1 to 4 times per year    Active Member of [employer] or Organizations: Yes    Attends Banker Meetings: Never    Marital Status: Divorced  Catering manager Violence: Not At Risk (04/20/2022)   Humiliation, Afraid, Rape, and Kick questionnaire    Fear of Current or Ex-Partner: No    Emotionally Abused: No    Physically Abused: No    Sexually Abused: No   Family History  Problem Relation Age of Onset   Heart disease Father    Diabetes Mother    HIV Brother    Healthy Son    Healthy Daughter    ROS: All systems reviewed and negative except as per HPI.   Current Outpatient Medications  Medication Sig Dispense Refill   acetaminophen  (TYLENOL ) 500 MG tablet Take 1-2 tablets (500-1,000 mg total) by mouth every 6 (six) hours as needed (pain.). 30 tablet 0   albuterol  (PROVENTIL ) (2.5 MG/3ML) 0.083% nebulizer solution Take 3 mLs (2.5 mg total) by nebulization every 4 (four) hours as needed for wheezing or shortness of breath. 300 mL 2   albuterol  (PROVENTIL ) (2.5 MG/3ML) 0.083%  nebulizer solution Take 3 mLs (2.5 mg total) by nebulization every 4 (four) hours as needed for wheezing or shortness of breath. 75 mL 6   albuterol  (VENTOLIN  HFA) 108 (90 Base) MCG/ACT inhaler Inhale 2 puffs into the lungs every 6 (six) hours as needed for wheezing or shortness of breath. 54 g 6   allopurinol  (ZYLOPRIM ) 100 MG tablet TAKE 2 TABLETS (200 MG TOTAL) BY MOUTH DAILY. 60 tablet 6   apixaban  (ELIQUIS ) 5 MG TABS tablet Take 1 tablet (5 mg total) by mouth 2 (two) times daily. 60 tablet 11   carvedilol  (COREG ) 12.5 MG tablet Take 1 tablet (12.5 mg total) by mouth 2 (two) times daily. 180 tablet 3   carvedilol  (COREG ) 12.5 MG tablet Take 1.5 tablets (18.75 mg total) by mouth 2 (two) times daily with a meal. 270 tablet 1   colchicine  0.6 MG tablet Take 0.5 tablets (0.3 mg total) by mouth three times a week. 15 tablet 2   dapagliflozin  propanediol (FARXIGA ) 10 MG TABS tablet Take 1 tablet (10 mg total) by mouth daily. 90 tablet 3   fluticasone -salmeterol (ADVAIR HFA) 230-21 MCG/ACT inhaler Inhale 2 puffs into the lungs 2 (two) times daily. 1 each 12   isosorbide -hydrALAZINE  (BIDIL ) 20-37.5 MG tablet Take 1 tablet by mouth 3 (three) times daily. 270 tablet 3   mirtazapine  (REMERON ) 15 MG tablet Take 0.5 tablets (7.5 mg total) by mouth at bedtime. 45 tablet 0   potassium chloride  (KLOR-CON ) 10 MEQ tablet Take 2 tablets (20 mEq total) by mouth daily. 180 tablet 2   Tiotropium Bromide  Monohydrate (SPIRIVA  RESPIMAT) 2.5 MCG/ACT AERS Inhale 2 puffs into the lungs daily. 4 g 6   torsemide  (DEMADEX ) 20 MG tablet Take 20-40 mg by mouth See admin instructions. Take one tablet (20 mg) by mouth on Monday, Wednesday, Friday, Sunday. Take two tablets (40 mg) by mouth on Tuesday, Thursday and Saturday.     traZODone  (DESYREL ) 50 MG tablet Take 50 mg by mouth at bedtime.     atorvastatin  (LIPITOR) 80 MG tablet Take  1 tablet (80 mg total) by mouth daily. (Patient taking differently: Take 80 mg by mouth at  bedtime.) 90 tablet 1   No current facility-administered medications for this visit.   BP (!) 147/93   Pulse 71   Wt 273 lb (123.8 kg)   SpO2 97%   BMI 37.03 kg/m  General: NAD Neck: No JVD, no thyromegaly or thyroid  nodule.  Lungs: Clear to auscultation bilaterally with normal respiratory effort. CV: Nondisplaced PMI.  Heart regular S1/S2, no S3/S4, no murmur.  No peripheral edema.  No carotid bruit.  Normal pedal pulses.  Abdomen: Soft, nontender, no hepatosplenomegaly, no distention.  Skin: Intact without lesions or rashes.  Neurologic: Alert and oriented x 3.  Psych: Normal affect. Extremities: No clubbing or cyanosis.  HEENT: Normal.   1. Chronic diastolic CHF/?hypertrophic cardiomyopathy: Prior history of HFrEF that may have been tachycardia-mediated in setting of atrial flutter in 1/22 vs cocaine-related, now EF has recovered to 50% on last study. Cardiolite in 2/23 showed no ischemia, fixed inferior defect.  Echo in 8/23 showed EF 50% with severe asymmetric septal hypertrophy but no SAM or LVOT gradient.  Cardiac MRI in 12/23 was similar with LV EF 50%, severe asymmetric septal hypertrophy, normal RV, LGE in the basal septum. These studies were concerning for hypertrophic cardiomyopathy.  RHC in 1/24 showed elevated filling pressures with CI 2.25.  His grandfather may have had sudden cardiac death at work, father died at 70 from "heart disease."  He has had syncopal episodes that may have been orthostatic, but I worried about ventricular arrhythmias given possible HCM and LGE noted on cardiac MRI. Zio monitor in 2/24 showed 1.5% PVCs and 6 short NSVT runs (longest 7 beats).  Nothing to explain syncope and has had no further episodes. 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   PKP2 gene is associated with ARVC and Brugada, but the clinical significance of the mutation found in this patient is uncertain (not reported in literature).  She was seen by Dr. Drexel Gentles, mutations not thought to be clinically significant.  Echo in 5/24 showed EF 45-50%, moderate asymmetric septal hypertrophy with no LVOT gradient of mitral valve SAM, RV normal, mild-moderate MR, IVC normal. Cath in 6/24 showed nonobstructive CAD, normal filling pressures, low CI. 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  NYHA class II-III, stable (some of this may be due to deconditioning). Not volume overloaded on exam.  - He was seen by Dr. Arlester Ladd for EP, thought to be borderline for ICD.  After discussion, he decided against placement.  - With no LVOT obstruction or significant murmur as well as increased BP, OK to continue Bidil  1 tab tid.   - Increase Coreg  to 18.75 mg bid.  - I will continue torsemide  20 mg daily alternating with 40 mg daily, BMET/BNP today.  - Continue dapagliflozin  10 mg daily.  - Repeat echo at followup in 7/25.  2. CKD stage 3: BMET/BNP today. Has referral to nephrology.   3. COPD: He has quit smoking.   4. Atrial flutter: S/p DCCV in 1/22.  NSR today.  - Continue apixaban .  5. Cocaine abuse: UDS positive in 7/24.   Says this was a one-time event.  6. 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  - Continue atorvastatin , check lipids.  7. Suspect OSA:  Sleep study ordered.      Followup in 3 months  I spent 32 minutes reviewing records, interviewing/examining patient, and managing orders.   Chris [name] 11/08/2023

## 2023-11-16 ENCOUNTER — Encounter: Admitting: Surgical

## 2023-11-18 ENCOUNTER — Other Ambulatory Visit (HOSPITAL_COMMUNITY): Payer: Self-pay | Admitting: Cardiology

## 2023-11-21 ENCOUNTER — Telehealth: Payer: Self-pay | Admitting: Internal Medicine

## 2023-11-21 DIAGNOSIS — J449 Chronic obstructive pulmonary disease, unspecified: Secondary | ICD-10-CM

## 2023-11-21 NOTE — Telephone Encounter (Signed)
 Patient has called the office again, please advise.

## 2023-11-21 NOTE — Telephone Encounter (Signed)
 Copied from CRM [id number]. Topic: Clinical - Order For Equipment >> Nov 21, 2023  8:06 AM Loreda Rodriguez T wrote:  Reason for CRM: patient called stated his nebulizer stopped working and need an order for a new one. Please f/u with patient

## 2023-11-23 NOTE — Telephone Encounter (Signed)
 Prescription left on your desk for the nebulizer machine. He does not need the nebulizer solution.  He gets that from his pharmacy and [place].

## 2023-11-23 NOTE — Addendum Note (Signed)
 Addended by: Concetta Dee B on: 11/23/2023 08:17 AM   Modules accepted: Orders

## 2023-11-23 NOTE — Telephone Encounter (Signed)
 Order received and successfully submitted to [facility] via [place]. Called & spoke to patient. Verified name & DOB. Informed that order was submitted. Patient expressed verbal understanding.

## 2023-11-24 DIAGNOSIS — J449 Chronic obstructive pulmonary disease, unspecified: Secondary | ICD-10-CM | POA: Diagnosis not present

## 2023-11-25 NOTE — Telephone Encounter (Signed)
 Copied from CRM [id number]. Topic: Clinical - Order For Equipment >> Nov 25, 2023  8:15 AM Rosaria Common wrote:  Reason for CRM: Pt calling to verify status on when he is able to pickup nebulizer machine. Callback number is [phone].

## 2023-11-25 NOTE — Telephone Encounter (Signed)
 Called but no answer. LVM informing that per [facility] the nebulizer machine was shipped on on 11/24/2023. Estimated delivery date is 11/26/2023 to patient's home.

## 2023-12-12 ENCOUNTER — Ambulatory Visit: Admitting: Surgical

## 2023-12-12 ENCOUNTER — Other Ambulatory Visit (INDEPENDENT_AMBULATORY_CARE_PROVIDER_SITE_OTHER): Payer: Self-pay

## 2023-12-12 ENCOUNTER — Other Ambulatory Visit: Payer: Self-pay

## 2023-12-12 DIAGNOSIS — M1611 Unilateral primary osteoarthritis, right hip: Secondary | ICD-10-CM

## 2023-12-12 DIAGNOSIS — M25551 Pain in right hip: Secondary | ICD-10-CM

## 2023-12-17 ENCOUNTER — Encounter: Payer: Self-pay | Admitting: Surgical

## 2023-12-17 MED ORDER — TRIAMCINOLONE ACETONIDE 40 MG/ML IJ SUSP
40.0000 mg | INTRAMUSCULAR | Status: AC | PRN
  Administered 2023-12-12: 40 mg via INTRA_ARTICULAR

## 2023-12-17 MED ORDER — BUPIVACAINE HCL 0.25 % IJ SOLN
7.0000 mL | INTRAMUSCULAR | Status: AC | PRN
  Administered 2023-12-12: 7 mL via INTRA_ARTICULAR

## 2023-12-17 NOTE — Progress Notes (Signed)
 Office Visit Note   Patient: Chris [name]           Date of Birth: 02/20/1961           MRN: [id number] Visit Date: 12/12/2023 Requested by: Louder Barnie NOVAK, MD [address] [place],  [place] [address] PCP: Louder Barnie NOVAK, MD  Subjective: Chief Complaint  Patient presents with   Right Hip - Pain    HPI: Chris [name] is a 63 y.o. male who presents to the office reporting right hip pain.  Patient states he has had hip pain that has been substantially worsening the last 2 to 3 months.  No history of injury.  Localizes pain primarily to the groin and the lateral aspect of the hip.  Is very painful to lift his leg with hip flexion and he oftentimes has to use momentum and throw his right leg onto a bed in order to lay down.  He is taking Tylenol  with no relief.  He has CHF and cardiomyopathy as well as COPD and CKD.  Cannot take NSAIDs.  His cardiologist is Dr. Rolan.  No radicular pain.  No new low back pain..                ROS: All systems reviewed are negative as they relate to the chief complaint within the history of present illness.  Patient denies fevers or chills.  Assessment & Plan: Visit Diagnoses:  1. Arthritis of right hip   2. Pain in right hip     Plan: Patient is a 63 year old male who is here for concern of right hip pain.  Referred by Mary-Anne persons PA-C.  Has history of right hip arthritis.  His pain has been worsening over the last few months.  With the severity of his symptoms, we discussed options such as trying intra-articular injection versus consultation for right hip replacement.  He does not want to pursue hip replacement if he can avoid it and with his medical history he is not the optimal surgical candidate.  He would like to proceed with cortisone injection today.  We did discuss the possibility of the injection only lasting for a few days or weeks but regardless, he would like to proceed.  Understands that if this only lasts for a  short amount of time, we cannot pursue surgical intervention for 3 months after injection.  Still would like to proceed.  Injection administered under ultrasound guidance and patient tolerated procedure overall well without complication though there was some light movement of patient during the injection.  Follow-up with the office in 6 weeks to see how he is doing.  Follow-Up Instructions: No follow-ups on file.   Orders:  Orders Placed This Encounter  Procedures   XR HIP UNILAT W OR W/O PELVIS 2-3 VIEWS RIGHT   US  Guided Needle Placement - No Linked Charges   No orders of the defined types were placed in this encounter.     Procedures: Large Joint Inj: R hip joint on 12/12/2023 9:09 AM Indications: pain and diagnostic evaluation Details: 22 G 3.5 in needle, ultrasound-guided anterior approach Medications: 7 mL bupivacaine  0.25 %; 40 mg triamcinolone  acetonide 40 MG/ML Outcome: tolerated well, no immediate complications Procedure, treatment alternatives, risks and benefits explained, specific risks discussed. Consent was given by the patient. Immediately prior to procedure a time out was called to verify the correct patient, procedure, equipment, support staff and site/side marked as required. Patient was prepped and draped in  the usual sterile fashion.       Clinical Data: No additional findings.  Objective: Vital Signs: There were no vitals taken for this visit.  Physical Exam:  Constitutional: Patient appears well-developed HEENT:  Head: Normocephalic Eyes:EOM are normal Neck: Normal range of motion Cardiovascular: Normal rate Pulmonary/chest: Effort normal Neurologic: Patient is alert Skin: Skin is warm Psychiatric: Patient has normal mood and affect  Ortho Exam: Ortho exam demonstrates right leg with no calf tenderness.  Negative Homans' sign.  Has pain with logroll of the right hip as well as hip flexion passively.  Has limited active hip flexion mostly secondary to  pain.  Active quad, hamstring, dorsiflexion, plantarflexion strength intact.  No cellulitis or skin changes noted overlying the right hip region.  Minimal tenderness over the greater trochanter.  Ambulating with Trendelenburg gait.  Specialty Comments:  CLINICAL DATA:  Low back pain radiating to the left hip and ankle   EXAM: MRI LUMBAR SPINE WITHOUT CONTRAST   TECHNIQUE: Multiplanar, multisequence MR imaging of the lumbar spine was performed. No intravenous contrast was administered.   COMPARISON:  None Available.   FINDINGS: Segmentation:  Standard.   Alignment:  Physiologic.   Vertebrae:  No fracture, evidence of discitis, or bone lesion.   Conus medullaris and cauda equina: Conus extends to the L1 level. Conus and cauda equina appear normal.   Paraspinal and other soft tissues: Negative   Disc levels:   L1-L2: Normal disc space and facet joints. No spinal canal stenosis. No neural foraminal stenosis.   L2-L3: Normal disc space and facet joints. No spinal canal stenosis. No neural foraminal stenosis.   L3-L4: Intermediate sized disc bulge with mild facet hypertrophy. No spinal canal stenosis. Mild left neural foraminal stenosis.   L4-L5: Large left asymmetric disc bulge with narrowing of the left lateral recess and mass effect on the left L5 nerve root. Mild facet hypertrophy. No central spinal canal stenosis. Severe left neural foraminal stenosis.   L5-S1: Mild facet hypertrophy. No spinal canal stenosis. Mild left neural foraminal stenosis.   Visualized sacrum: Normal.   IMPRESSION: 1. Large left asymmetric disc bulge at L4-L5 with narrowing of the left lateral recess and mass effect on the left L5 nerve root. Severe left neural foraminal stenosis. 2. Mild left L3-L4 and L5-S1 neural foraminal stenosis.     Electronically Signed   By: Franky Stanford M.D.   On: 05/21/2023 02:40 --- IMPRESSION: 1. 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 2. 1.4 cm  osteochondral lesion along the superomedial portion of the right (contralateral) femoral head. 3. Abnormal marrow edema medially along the posterior pubic bones bilaterally, without an obvious or large tear of the adjoining hip adductor aponeurosis. This could be from stress reaction or bone contusion. 4. Proximal hamstring tendinopathy bilaterally. Partial tearing of the contralateral (right) hamstring tendon proximally. 5. Amorphous accentuated signal in the left anterior labrum suspicious for a degenerative anterior superior labral tear. I do note that on the CT of 06/29/2022 there is chondrocalcinosis in the labrum, this can sometimes cause false-positive assessment for labral tear.     Electronically Signed   By: Ryan Salvage M.D.   On: 05/21/2023 11:38  Imaging: No results found.   PMFS History: Patient Active Problem List   Diagnosis Date Noted   Chills 07/29/2023   Arthritis of right hip 07/29/2023   Pain in left shoulder 07/27/2023   Post traumatic stress disorder 09/23/2022   MDD (major depressive disorder), recurrent episode, moderate (HCC) 08/26/2022  Stage 3b chronic kidney disease (HCC) 08/09/2022   Hypertrophic cardiomyopathy (HCC) 08/09/2022   History of renal cell cancer 08/09/2022   Chronic heart failure with preserved ejection fraction (HFpEF) (HCC) 07/13/2022   Agitation 04/23/2022   RSV (respiratory syncytial virus pneumonia) 04/22/2022   Obesity (BMI 30-39.9) 04/21/2022   COPD exacerbation (HCC) 04/19/2022   Dyslipidemia 04/19/2022   Gout 04/19/2022   Right kidney mass 05/14/2021   CHF exacerbation (HCC) 04/14/2021   Chest pain 04/14/2021   Syncope 04/14/2021   Left-sided weakness 10/28/2020   Typical atrial flutter (HCC)    CHF (congestive heart failure) (HCC) 07/04/2020   Acute exacerbation of CHF (congestive heart failure) (HCC) 06/16/2020   Influenza vaccine refused 05/06/2020   Acute decompensated heart failure (HCC) 05/04/2020    Illiteracy 05/04/2020   Type 2 diabetes mellitus with stage 3 chronic kidney disease (HCC) 12/25/2019   Elevated troponin I level 10/26/2019   History of gout 02/01/2019   Seasonal allergic rhinitis due to pollen 02/01/2019   Nicotine  use disorder 11/30/2018   Microscopic hematuria 11/30/2018   Depression 11/30/2018   Difficulty controlling anger 11/30/2018   COPD (chronic obstructive pulmonary disease) (HCC)    CKD (chronic kidney disease) stage 3, GFR 30-59 ml/min (HCC) 08/10/2018   Recurrent epistaxis 04/21/2018   Mixed hyperlipidemia 07/28/2017   Essential hypertension 07/28/2017   Chronic systolic heart failure (HCC) 10/25/2014   Cocaine abuse (HCC) 02/20/2013   Cannabis abuse 02/20/2013   Back pain, chronic 02/20/2013   Past Medical History:  Diagnosis Date   Arrhythmia    atrial flutter   CHF (congestive heart failure) (HCC)    Chronic kidney disease    COPD (chronic obstructive pulmonary disease) (HCC)    Coronary artery disease    Depression    Diabetes mellitus without complication (HCC)    GERD (gastroesophageal reflux disease)    Gout    Hypertension    Influenza A with respiratory manifestations    Mental disorder     Family History  Problem Relation Age of Onset   Heart disease Father    Diabetes Mother    HIV Brother    Healthy Son    Healthy Daughter     Past Surgical History:  Procedure Laterality Date   ANKLE SURGERY     CARDIAC CATHETERIZATION     CARDIOVERSION N/A 07/08/2020   Procedure: CARDIOVERSION;  Surgeon: Hester Wolm PARAS, MD;  Location: [facility] ORS;  Service: Cardiovascular;  Laterality: N/A;   COLONOSCOPY WITH PROPOFOL  N/A 11/19/2021   Procedure: COLONOSCOPY WITH PROPOFOL ;  Surgeon: Stacia Glendia BRAVO, MD;  Location: [facility];  Service: Gastroenterology;  Laterality: N/A;   HERNIA REPAIR     x2   IR RADIOLOGIST EVAL & MGMT  05/13/2022   IR RADIOLOGIST EVAL & MGMT  10/27/2022   IR RADIOLOGIST EVAL & MGMT  03/02/2023   IR RADIOLOGIST  EVAL & MGMT  10/31/2023   POLYPECTOMY  11/19/2021   Procedure: POLYPECTOMY;  Surgeon: Stacia Glendia BRAVO, MD;  Location: [facility];  Service: Gastroenterology;;   RADIOLOGY WITH ANESTHESIA N/A 06/23/2022   Procedure: Right renal tumor ablation;  Surgeon: Jennefer Ester PARAS, MD;  Location: [facility] OR;  Service: Radiology;  Laterality: N/A;   RIGHT HEART CATH N/A 07/13/2022   Procedure: RIGHT HEART CATH;  Surgeon: Rolan Ezra RAMAN, MD;  Location: [facility] INVASIVE CV LAB;  Service: Cardiovascular;  Laterality: N/A;   RIGHT/LEFT HEART CATH AND CORONARY ANGIOGRAPHY N/A 11/19/2022   Procedure: RIGHT/LEFT HEART CATH AND CORONARY  ANGIOGRAPHY;  Surgeon: Rolan Ezra RAMAN, MD;  Location: [facility] INVASIVE CV LAB;  Service: Cardiovascular;  Laterality: N/A;   SHOULDER SURGERY     TEE WITHOUT CARDIOVERSION N/A 07/08/2020   Procedure: TRANSESOPHAGEAL ECHOCARDIOGRAM (TEE);  Surgeon: Hester Wolm PARAS, MD;  Location: [facility] OR[facility];  Service: Cardiovascular;  Laterality: N/A;   Social History   Occupational History   Occupation: disability  Tobacco Use   Smoking status: Every Day    Current packs/day: 1.00    Average packs/day: 1 pack/day for 43.0 years (43.0 ttl pk-yrs)    Types: Cigarettes   Smokeless tobacco: Never   Tobacco comments:        6 cigs daily--03/28/2023  Vaping Use   Vaping status: Never Used  Substance and Sexual Activity   Alcohol use: Yes    Alcohol/week: 4.0 standard drinks of alcohol    Types: 4 Shots of liquor per week   Drug use: Yes    Frequency: 21.0 times per week    Types: Marijuana    Comment: last use Cocaine- 03/28/2021. Still using marijuana, last use 06/22/21   Sexual activity: Not on file

## 2023-12-23 ENCOUNTER — Telehealth: Payer: Self-pay

## 2023-12-23 ENCOUNTER — Other Ambulatory Visit: Payer: Self-pay | Admitting: Orthopedic Surgery

## 2023-12-23 MED ORDER — TRAMADOL HCL 50 MG PO TABS: 50.0000 mg | ORAL_TABLET | Freq: Two times a day (BID) | ORAL | 0 refills | Status: DC | PRN

## 2023-12-23 NOTE — Telephone Encounter (Signed)
 Patient called stating that he is still having right hip pain and that the injection did not help.  Would like a Rx for pain.  Cb# [phone].  Please advise.  Thank you.

## 2023-12-23 NOTE — Telephone Encounter (Signed)
Tramadol sent. 

## 2023-12-27 ENCOUNTER — Ambulatory Visit: Admission: RE | Admit: 2023-12-27 | Source: Ambulatory Visit

## 2024-01-04 ENCOUNTER — Ambulatory Visit: Admitting: Orthopedic Surgery

## 2024-01-04 ENCOUNTER — Telehealth: Payer: Self-pay

## 2024-01-04 ENCOUNTER — Other Ambulatory Visit: Payer: Self-pay | Admitting: Surgical

## 2024-01-04 DIAGNOSIS — M25551 Pain in right hip: Secondary | ICD-10-CM | POA: Diagnosis not present

## 2024-01-04 DIAGNOSIS — M1611 Unilateral primary osteoarthritis, right hip: Secondary | ICD-10-CM

## 2024-01-04 MED ORDER — OXYCODONE-ACETAMINOPHEN 5-325 MG PO TABS: ORAL_TABLET | ORAL | 0 refills | Status: DC

## 2024-01-04 NOTE — Telephone Encounter (Signed)
 Pt states his Rx for oxycodone  was called into the wrong pharmacy ([facility] ) and needs to be sent to [facility].  Pt also states that he changed his pharmacy to [facility] and Prior shara is needed for the Oxycodone .

## 2024-01-04 NOTE — Telephone Encounter (Signed)
 Patient would Rx for Oxycodone  sent to [facility] pharmacy due to the price of the medication being a bit less at this pharmacy than [facility].   Per [facility], please cancel Rx at [facility].  CB# for patient is [phone].  Please advise.  Thank you.

## 2024-01-05 ENCOUNTER — Telehealth: Payer: Self-pay

## 2024-01-05 ENCOUNTER — Encounter: Payer: Self-pay | Admitting: Orthopedic Surgery

## 2024-01-05 NOTE — Telephone Encounter (Signed)
 I think we did yesterday thanks

## 2024-01-05 NOTE — Telephone Encounter (Signed)
 [facility] called stating that a PA is required for Oxycodone . PA Key: [id number]

## 2024-01-05 NOTE — Progress Notes (Signed)
 Office Visit Note   Patient: Chris [name]           Date of Birth: Mar 13, 1961           MRN: [id number] Visit Date: 01/04/2024 Requested by: Louder Barnie NOVAK, MD [address] [place] [address] [place],  [place] [address] PCP: Louder Barnie NOVAK, MD  Subjective: Chief Complaint  Patient presents with   Right Hip - Pain, Follow-up    HPI: Chris [name] is a 63 y.o. male who presents to the office reporting right hip pain.  He last saw [place] on 12/12/2023.  Had a cortisone injection which gave him about a day and a half of relief but then the pain recurred.  He is debilitated by the pain.  He is ambulating with a cane.  Not a smoker.  Heartburn for days.  He does have extensive cardiac history..                ROS: All systems reviewed are negative as they relate to the chief complaint within the history of present illness.  Patient denies fevers or chills.  Assessment & Plan: Visit Diagnoses:  1. Pain in right hip   2. Arthritis of right hip     Plan: Impression is right hip arthritis which is very symptomatic.  Did have an injection in the knee hip joint about a month ago.  Advised him would be best for him to wait about 2 more months before he gets the hip replacement.  He requested pain medicine and I did discuss with him at length how taking pain medicine before surgery makes it much less effective after surgery.  Patient understands but cannot really wait 2 months in the current state that his right hip is not.  The risk and benefits of hip replacement are discussed with the patient including not limited to infection or vessel damage limb length inequality as well as incomplete pain relief and the extensive nature of the rehabilitative process is also discussed.  I did look at [facility] on the table and he does have a very muscular leg with small pannus.  Because of his cardiac history and overall body habitus his best option for an expedient surgery would be with Dr. Vernetta.  I will arrange  a consult with Dr. Vernetta.  Follow-up with us  as needed.  Follow-Up Instructions: No follow-ups on file.   Orders:  Orders Placed This Encounter  Procedures   Ambulatory referral to Orthopedic Surgery   Meds ordered this encounter  Medications   DISCONTD: oxyCODONE -acetaminophen  (PERCOCET/ROXICET) 5-325 MG tablet    Sig: 1 po tid prn pain    Dispense:  45 tablet    Refill:  0      Procedures: No procedures performed   Clinical Data: No additional findings.  Objective: Vital Signs: There were no vitals taken for this visit.  Physical Exam:  Constitutional: Patient appears well-developed HEENT:  Head: Normocephalic Eyes:EOM are normal Neck: Normal range of motion Cardiovascular: Normal rate Pulmonary/chest: Effort normal Neurologic: Patient is alert Skin: Skin is warm Psychiatric: Patient has normal mood and affect  Ortho Exam: Ortho exam demonstrates antalgic gait to the right.  Does have a lot of pain with right hip passive internal and external rotation.  Does have palpable pedal pulses and intact ankle dorsiflexion plantarflexion quad and hamstring strength.  Leg lengths approximately equal.  Patient does have a very muscular leg.  Pannus does overlay to a small degree the groin crease.  Specialty Comments:  CLINICAL DATA:  Low back pain radiating to the left hip and ankle   EXAM: MRI LUMBAR SPINE WITHOUT CONTRAST   TECHNIQUE: Multiplanar, multisequence MR imaging of the lumbar spine was performed. No intravenous contrast was administered.   COMPARISON:  None Available.   FINDINGS: Segmentation:  Standard.   Alignment:  Physiologic.   Vertebrae:  No fracture, evidence of discitis, or bone lesion.   Conus medullaris and cauda equina: Conus extends to the L1 level. Conus and cauda equina appear normal.   Paraspinal and other soft tissues: Negative   Disc levels:   L1-L2: Normal disc space and facet joints. No spinal canal stenosis. No neural  foraminal stenosis.   L2-L3: Normal disc space and facet joints. No spinal canal stenosis. No neural foraminal stenosis.   L3-L4: Intermediate sized disc bulge with mild facet hypertrophy. No spinal canal stenosis. Mild left neural foraminal stenosis.   L4-L5: Large left asymmetric disc bulge with narrowing of the left lateral recess and mass effect on the left L5 nerve root. Mild facet hypertrophy. No central spinal canal stenosis. Severe left neural foraminal stenosis.   L5-S1: Mild facet hypertrophy. No spinal canal stenosis. Mild left neural foraminal stenosis.   Visualized sacrum: Normal.   IMPRESSION: 1. Large left asymmetric disc bulge at L4-L5 with narrowing of the left lateral recess and mass effect on the left L5 nerve root. Severe left neural foraminal stenosis. 2. Mild left L3-L4 and L5-S1 neural foraminal stenosis.     Electronically Signed   By: Franky Stanford M.D.   On: 05/21/2023 02:40 --- IMPRESSION: 1. 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 2. 1.4 cm osteochondral lesion along the superomedial portion of the right (contralateral) femoral head. 3. Abnormal marrow edema medially along the posterior pubic bones bilaterally, without an obvious or large tear of the adjoining hip adductor aponeurosis. This could be from stress reaction or bone contusion. 4. Proximal hamstring tendinopathy bilaterally. Partial tearing of the contralateral (right) hamstring tendon proximally. 5. Amorphous accentuated signal in the left anterior labrum suspicious for a degenerative anterior superior labral tear. I do note that on the CT of 06/29/2022 there is chondrocalcinosis in the labrum, this can sometimes cause false-positive assessment for labral tear.     Electronically Signed   By: Ryan Salvage M.D.   On: 05/21/2023 11:38  Imaging: No results found.   PMFS History: Patient Active Problem List   Diagnosis Date Noted   Chills 07/29/2023    Arthritis of right hip 07/29/2023   Pain in left shoulder 07/27/2023   Post traumatic stress disorder 09/23/2022   MDD (major depressive disorder), recurrent episode, moderate (HCC) 08/26/2022   Stage 3b chronic kidney disease (HCC) 08/09/2022   Hypertrophic cardiomyopathy (HCC) 08/09/2022   History of renal cell cancer 08/09/2022   Chronic heart failure with preserved ejection fraction (HFpEF) (HCC) 07/13/2022   Agitation 04/23/2022   RSV (respiratory syncytial virus pneumonia) 04/22/2022   Obesity (BMI 30-39.9) 04/21/2022   COPD exacerbation (HCC) 04/19/2022   Dyslipidemia 04/19/2022   Gout 04/19/2022   Right kidney mass 05/14/2021   CHF exacerbation (HCC) 04/14/2021   Chest pain 04/14/2021   Syncope 04/14/2021   Left-sided weakness 10/28/2020   Typical atrial flutter (HCC)    CHF (congestive heart failure) (HCC) 07/04/2020   Acute exacerbation of CHF (congestive heart failure) (HCC) 06/16/2020   Influenza vaccine refused 05/06/2020   Acute decompensated heart failure (HCC) 05/04/2020   Illiteracy 05/04/2020   Type 2 diabetes  mellitus with stage 3 chronic kidney disease (HCC) 12/25/2019   Elevated troponin I level 10/26/2019   History of gout 02/01/2019   Seasonal allergic rhinitis due to pollen 02/01/2019   Nicotine  use disorder 11/30/2018   Microscopic hematuria 11/30/2018   Depression 11/30/2018   Difficulty controlling anger 11/30/2018   COPD (chronic obstructive pulmonary disease) (HCC)    CKD (chronic kidney disease) stage 3, GFR 30-59 ml/min (HCC) 08/10/2018   Recurrent epistaxis 04/21/2018   Mixed hyperlipidemia 07/28/2017   Essential hypertension 07/28/2017   Chronic systolic heart failure (HCC) 10/25/2014   Cocaine abuse (HCC) 02/20/2013   Cannabis abuse 02/20/2013   Back pain, chronic 02/20/2013   Past Medical History:  Diagnosis Date   Arrhythmia    atrial flutter   CHF (congestive heart failure) (HCC)    Chronic kidney disease    COPD (chronic  obstructive pulmonary disease) (HCC)    Coronary artery disease    Depression    Diabetes mellitus without complication (HCC)    GERD (gastroesophageal reflux disease)    Gout    Hypertension    Influenza A with respiratory manifestations    Mental disorder     Family History  Problem Relation Age of Onset   Heart disease Father    Diabetes Mother    HIV Brother    Healthy Son    Healthy Daughter     Past Surgical History:  Procedure Laterality Date   ANKLE SURGERY     CARDIAC CATHETERIZATION     CARDIOVERSION N/A 07/08/2020   Procedure: CARDIOVERSION;  Surgeon: Hester Wolm PARAS, MD;  Location: [facility] ORS;  Service: Cardiovascular;  Laterality: N/A;   COLONOSCOPY WITH PROPOFOL  N/A 11/19/2021   Procedure: COLONOSCOPY WITH PROPOFOL ;  Surgeon: Stacia Glendia BRAVO, MD;  Location: [facility];  Service: Gastroenterology;  Laterality: N/A;   HERNIA REPAIR     x2   IR RADIOLOGIST EVAL & MGMT  05/13/2022   IR RADIOLOGIST EVAL & MGMT  10/27/2022   IR RADIOLOGIST EVAL & MGMT  03/02/2023   IR RADIOLOGIST EVAL & MGMT  10/31/2023   POLYPECTOMY  11/19/2021   Procedure: POLYPECTOMY;  Surgeon: Stacia Glendia BRAVO, MD;  Location: [facility];  Service: Gastroenterology;;   RADIOLOGY WITH ANESTHESIA N/A 06/23/2022   Procedure: Right renal tumor ablation;  Surgeon: Jennefer Ester PARAS, MD;  Location: [facility] OR;  Service: Radiology;  Laterality: N/A;   RIGHT HEART CATH N/A 07/13/2022   Procedure: RIGHT HEART CATH;  Surgeon: Rolan Ezra RAMAN, MD;  Location: [facility] INVASIVE CV LAB;  Service: Cardiovascular;  Laterality: N/A;   RIGHT/LEFT HEART CATH AND CORONARY ANGIOGRAPHY N/A 11/19/2022   Procedure: RIGHT/LEFT HEART CATH AND CORONARY ANGIOGRAPHY;  Surgeon: Rolan Ezra RAMAN, MD;  Location: [facility] INVASIVE CV LAB;  Service: Cardiovascular;  Laterality: N/A;   SHOULDER SURGERY     TEE WITHOUT CARDIOVERSION N/A 07/08/2020   Procedure: TRANSESOPHAGEAL ECHOCARDIOGRAM (TEE);  Surgeon: Hester Wolm PARAS, MD;  Location: [facility] OR[facility];   Service: Cardiovascular;  Laterality: N/A;   Social History   Occupational History   Occupation: disability  Tobacco Use   Smoking status: Every Day    Current packs/day: 1.00    Average packs/day: 1 pack/day for 43.0 years (43.0 ttl pk-yrs)    Types: Cigarettes   Smokeless tobacco: Never   Tobacco comments:        6 cigs daily--03/28/2023  Vaping Use   Vaping status: Never Used  Substance and Sexual Activity   Alcohol use: Yes  Alcohol/week: 4.0 standard drinks of alcohol    Types: 4 Shots of liquor per week   Drug use: Yes    Frequency: 21.0 times per week    Types: Marijuana    Comment: last use Cocaine- 03/28/2021. Still using marijuana, last use 06/22/21   Sexual activity: Not on file

## 2024-01-06 ENCOUNTER — Telehealth: Payer: Self-pay

## 2024-01-06 ENCOUNTER — Other Ambulatory Visit: Payer: Self-pay | Admitting: Surgical

## 2024-01-06 DIAGNOSIS — M1611 Unilateral primary osteoarthritis, right hip: Secondary | ICD-10-CM

## 2024-01-06 MED ORDER — OXYCODONE-ACETAMINOPHEN 5-325 MG PO TABS: 1.0000 | ORAL_TABLET | Freq: Three times a day (TID) | ORAL | 0 refills | Status: DC | PRN

## 2024-01-06 MED ORDER — OXYCODONE-ACETAMINOPHEN 5-325 MG PO TABS: 1.0000 | ORAL_TABLET | Freq: Three times a day (TID) | ORAL | 0 refills | Status: AC | PRN

## 2024-01-06 NOTE — Telephone Encounter (Signed)
 Duplicate encounter, ignore please

## 2024-01-06 NOTE — Telephone Encounter (Signed)
 [facility] called requesting new prescription for 5 day supply. Luke sent prescription to pharmacy.

## 2024-01-06 NOTE — Telephone Encounter (Signed)
 Pt called very upset stating that he needs his pain medicine refill and that he was told that Dr. Addie has not done his prescription correctly. We had submitted a prior authorization on 01/05/24 which was denied on 01/06/24. Pt was very upset stating this does not make any f---ing sense. Pt continued to repeat that statement several times. Pt then said he is on the way to the office to talk to someone about this situation. Dr. Addie ended up talking to the pt over the phone and sent in a 5 day prescription of the Oxycodone . Pt was using profanity while talking to Dr. Addie as well.

## 2024-01-06 NOTE — Telephone Encounter (Signed)
 Pt called stating that his insurance called him and told him that the reason they denied his medication was because Herlene did not put the reason why the medication was needed. He then asked to speak to either Dr. Addie or Herlene I then told him they are both busy seeing patients and I will give them a note saying he would like to speak to either one.

## 2024-01-10 NOTE — Telephone Encounter (Signed)
 Thanks [place].  I talked to him as well that day.  Definitely was cursing and is very upset about his pain.  I think if he requires repeated prescriptions we should get him set up in pain management.  Dr. Vernetta and/or Dr. Jerri may or may not desire to replace his hip.

## 2024-01-11 ENCOUNTER — Ambulatory Visit: Attending: Cardiology | Admitting: Cardiology

## 2024-01-16 ENCOUNTER — Emergency Department (HOSPITAL_COMMUNITY)

## 2024-01-16 ENCOUNTER — Telehealth: Payer: Self-pay | Admitting: Orthopaedic Surgery

## 2024-01-16 ENCOUNTER — Ambulatory Visit: Payer: Self-pay

## 2024-01-16 ENCOUNTER — Emergency Department (HOSPITAL_COMMUNITY)
Admission: EM | Admit: 2024-01-16 | Discharge: 2024-01-17 | Disposition: A | Discharge status: Home / Self Care | Attending: Emergency Medicine | Admitting: Emergency Medicine

## 2024-01-16 ENCOUNTER — Other Ambulatory Visit: Payer: Self-pay

## 2024-01-16 DIAGNOSIS — R0602 Shortness of breath: Secondary | ICD-10-CM | POA: Diagnosis not present

## 2024-01-16 DIAGNOSIS — J441 Chronic obstructive pulmonary disease with (acute) exacerbation: Secondary | ICD-10-CM | POA: Diagnosis not present

## 2024-01-16 DIAGNOSIS — Z7901 Long term (current) use of anticoagulants: Secondary | ICD-10-CM | POA: Insufficient documentation

## 2024-01-16 DIAGNOSIS — M25551 Pain in right hip: Secondary | ICD-10-CM | POA: Diagnosis not present

## 2024-01-16 DIAGNOSIS — R059 Cough, unspecified: Secondary | ICD-10-CM | POA: Diagnosis not present

## 2024-01-16 DIAGNOSIS — I509 Heart failure, unspecified: Secondary | ICD-10-CM | POA: Insufficient documentation

## 2024-01-16 DIAGNOSIS — R918 Other nonspecific abnormal finding of lung field: Secondary | ICD-10-CM | POA: Diagnosis not present

## 2024-01-16 DIAGNOSIS — I517 Cardiomegaly: Secondary | ICD-10-CM | POA: Diagnosis not present

## 2024-01-16 DIAGNOSIS — Z7951 Long term (current) use of inhaled steroids: Secondary | ICD-10-CM | POA: Insufficient documentation

## 2024-01-16 LAB — CBC
HCT: 45.8 % (ref 39.0–52.0)
Hemoglobin: 14.5 g/dL (ref 13.0–17.0)
MCH: 30 pg (ref 26.0–34.0)
MCHC: 31.7 g/dL (ref 30.0–36.0)
MCV: 94.6 fL (ref 80.0–100.0)
Platelets: 209 K/uL (ref 150–400)
RBC: 4.84 MIL/uL (ref 4.22–5.81)
RDW: 14.9 % (ref 11.5–15.5)
WBC: 9.3 K/uL (ref 4.0–10.5)
nRBC: 0 % (ref 0.0–0.2)

## 2024-01-16 MED ORDER — IPRATROPIUM-ALBUTEROL 0.5-2.5 (3) MG/3ML IN SOLN
3.0000 mL | RESPIRATORY_TRACT | Status: AC
  Administered 2024-01-16 – 2024-01-17 (×3): 3 mL via RESPIRATORY_TRACT
  Filled 2024-01-16: qty 3
  Filled 2024-01-16: qty 6

## 2024-01-16 MED ORDER — OXYCODONE-ACETAMINOPHEN 5-325 MG PO TABS
2.0000 | ORAL_TABLET | Freq: Once | ORAL | Status: AC
  Administered 2024-01-17: 2 via ORAL
  Filled 2024-01-16: qty 2

## 2024-01-16 MED ORDER — HYDROMORPHONE HCL 1 MG/ML IJ SOLN: 1.0000 mg | Freq: Once | INTRAMUSCULAR | Status: DC

## 2024-01-16 NOTE — Telephone Encounter (Signed)
 noted

## 2024-01-16 NOTE — Telephone Encounter (Signed)
 Pt states he is in severe hip pain and can't take the pain anymore and asking to be seen sooner. Please call pt at [phone].

## 2024-01-16 NOTE — ED Triage Notes (Signed)
 Patient c/o shob and hip pain x month, hx of same and supposed to get hip replaced. Patient reports hx of heart problems as well.

## 2024-01-16 NOTE — Telephone Encounter (Signed)
 FYI Only or Action Required?: Action required by provider: referral request and update on patient condition. Pt seen by Ortho, surgery pending. Pt uncertain about working current orthopedic doctor, asking for referral to another.   Patient was last seen in primary care on 07/29/2023 by Avelina Greig BRAVO, MD.  Called Nurse Triage reporting Hip Pain.  Symptoms began several months ago.  Interventions attempted: Prescription medications: Oxycodone  5mg . Patient says pain is not controlled well  Symptoms are: gradually worsening.  Triage Disposition: No disposition on file. This RN advised Urgent care, patien adamant about scheduling a visit. Went ahead and schedule OV for 8/21. Pt says if pain persist he will go ahead and go to UC.  Patient/caregiver understands and will follow disposition?:    Copied from CRM [id number]. Topic: Clinical - Red Word Triage >> Jan 16, 2024 11:16 AM Sophia H wrote: [facility] that prompted transfer to Nurse Triage:    Patient states he needs general check up/med check, while looking for appointment and going through decision tree patient states he has severe pain. Advised since he is in severe pain I need to have him speak with NT and cannot schedule an appointment. Reason for Disposition  [1] SEVERE pain (e.g., excruciating, unable to do any normal activities) AND [2] not improved after 2 hours of pain medicine  Answer Assessment - Initial Assessment Questions 1. LOCATION and RADIATION: Where is the pain located? Does the pain spread (shoot) anywhere else?     Right hip Pain  2. QUALITY: What does the pain feel like?  (e.g., sharp, dull, aching, burning)     *No Answer* 3. SEVERITY: How bad is the pain? What does it keep you from doing?   (Scale 1-10; or mild, moderate, severe)     Severe  4. ONSET: When did the pain start? Does it come and go, or is it there all the time?     Several month  5. WORK OR EXERCISE: Has there been any recent work  or exercise that involved this part of the body?      *No Answer* 6. CAUSE: What do you think is causing the hip pain?      Bone to bone pain, followed by ortho  7. AGGRAVATING FACTORS: What makes the hip pain worse? (e.g., walking, climbing stairs, running)     *No Answer* 8. OTHER SYMPTOMS: Do you have any other symptoms? (e.g., back pain, pain shooting down leg,  fever, rash)     *No Answer*  Protocols used: Hip Pain-A-AH

## 2024-01-16 NOTE — Telephone Encounter (Signed)
 FYI Only or Action Required?: FYI only for provider.  Patient was last seen in primary care on 07/29/2023 by Avelina Greig BRAVO, MD.  Called Nurse Triage reporting Hip Pain.  Symptoms began several months ago.  Interventions attempted: Rest, hydration, or home remedies.  Symptoms are: gradually worsening.  Triage Disposition: See HCP Within 4 Hours (Or PCP Triage)  Patient/caregiver understands and will follow disposition?: Yes, but will wait   Copied from CRM #[id number]. Topic: Clinical - Red Word Triage >> Jan 16, 2024  9:59 AM Chris [name] wrote: Chris [name] that prompted transfer to Nurse Triage: Patient states has had hip pain for months but it has gotten worse.  Also stated needs a physical to check his heart. Reason for Disposition  [1] SEVERE pain (e.g., excruciating, unable to do any normal activities) AND [2] not improved after 2 hours of pain medicine  Answer Assessment - Initial Assessment Questions 1. LOCATION and RADIATION: Where is the pain located? Does the pain spread (shoot) anywhere else?     Right hip 2. QUALITY: What does the pain feel like?  (e.g., sharp, dull, aching, burning)     Aching 3. SEVERITY: How bad is the pain? What does it keep you from doing?   (Scale 1-10; or mild, moderate, severe)     Bad limping 4. ONSET: When did the pain start? Does it come and go, or is it there all the time?     Chronic-worsening 5. WORK OR EXERCISE: Has there been any recent work or exercise that involved this part of the body?       6. CAUSE: What do you think is causing the hip pain?       7. AGGRAVATING FACTORS: What makes the hip pain worse? (e.g., walking, climbing stairs, running)      8. OTHER SYMPTOMS: Do you have any other symptoms? (e.g., back pain, pain shooting down leg,  fever, rash)     Denies  Additional info: No appointments are available, advised urgent care, he is agreeable but will wait to go until 01/17/24  Protocols used: Hip  Pain-A-AH

## 2024-01-16 NOTE — ED Provider Notes (Signed)
 [place] EMERGENCY DEPARTMENT AT [facility] Provider Note   CSN: [id number] Arrival date & time: 01/16/24  2141     Patient presents with: Shortness of Breath and Hip Pain   Chris [name] is a 63 y.o. male.  {Add pertinent medical, surgical, social history, OB history to HPI:32947} 63 yo M w/ h/o COPD and CHF here for sob and hip pain.   His hip pain has been present for quite a while and is progressively worsening.  He is thinking he should get surgery in September however the pain is quite a bit today.  No new fevers, trauma or any other associated symptoms.  His breathing has been off and on for the last couple years.  Patient states that he has been coughing a lot recently using inhalers without much help.  Thinks he might be getting some fluid in his stomach as well.  Is unable to sleep much at night secondary to coughing. No fevers. Coughing mucus but no change in color. No leg edema.   Shortness of Breath Hip Pain Associated symptoms include shortness of breath.       Prior to Admission medications   Medication Sig Start Date End Date Taking? Authorizing Provider  acetaminophen  (TYLENOL ) 500 MG tablet Take 1-2 tablets (500-1,000 mg total) by mouth every 6 (six) hours as needed (pain.). 12/09/22   Garrick Charleston, MD  albuterol  (PROVENTIL ) (2.5 MG/3ML) 0.083% nebulizer solution Take 3 mLs (2.5 mg total) by nebulization every 4 (four) hours as needed for wheezing or shortness of breath. 01/05/23   Danton Jon HERO, PA-C  albuterol  (PROVENTIL ) (2.5 MG/3ML) 0.083% nebulizer solution Take 3 mLs (2.5 mg total) by nebulization every 4 (four) hours as needed for wheezing or shortness of breath. 08/18/23   Cottie Donnice PARAS, MD  albuterol  (VENTOLIN  HFA) 108 [phone] Base) MCG/ACT inhaler Inhale 2 puffs into the lungs every 6 (six) hours as needed for wheezing or shortness of breath. 07/26/23   Louder Barnie NOVAK, MD  allopurinol  (ZYLOPRIM ) 100 MG tablet TAKE 2 TABLETS (200 MG  TOTAL) BY MOUTH DAILY. 07/26/23   Louder Barnie NOVAK, MD  atorvastatin  (LIPITOR) 80 MG tablet Take 1 tablet (80 mg total) by mouth daily. Patient taking differently: Take 80 mg by mouth at bedtime. 07/26/23 10/24/23  Louder Barnie NOVAK, MD  carvedilol  (COREG ) 12.5 MG tablet Take 1 tablet (12.5 mg total) by mouth 2 (two) times daily. 07/26/23   Louder Barnie NOVAK, MD  carvedilol  (COREG ) 12.5 MG tablet Take 1.5 tablets (18.75 mg total) by mouth 2 (two) times daily with a meal. 11/08/23   Rolan Ezra RAMAN, MD  colchicine  0.6 MG tablet Take 0.5 tablets (0.3 mg total) by mouth three times a week. 08/18/23   Louder Barnie NOVAK, MD  dapagliflozin  propanediol (FARXIGA ) 10 MG TABS tablet Take 1 tablet (10 mg total) by mouth daily. 07/26/23   Louder Barnie NOVAK, MD  ELIQUIS  5 MG TABS tablet TAKE 1 TABLET BY MOUTH 2 (TWO) TIMES DAILY (AM+PM) 11/21/23   Rolan Ezra RAMAN, MD  fluticasone -salmeterol (ADVAIR HFA) 230-21 MCG/ACT inhaler Inhale 2 puffs into the lungs 2 (two) times daily. 07/26/23   Louder Barnie NOVAK, MD  isosorbide -hydrALAZINE  (BIDIL ) 20-37.5 MG tablet Take 1 tablet by mouth 3 (three) times daily. 07/26/23   Louder Barnie NOVAK, MD  mirtazapine  (REMERON ) 15 MG tablet Take 0.5 tablets (7.5 mg total) by mouth at bedtime. 09/06/23 12/05/23  Lynnette Barter, MD  potassium chloride  (KLOR-CON ) 10 MEQ tablet Take 2 tablets (  20 mEq total) by mouth daily. 07/26/23   Vicci Barnie NOVAK, MD  Tiotropium Bromide  Monohydrate (SPIRIVA  RESPIMAT) 2.5 MCG/ACT AERS Inhale 2 puffs into the lungs daily. 07/26/23   Vicci Barnie NOVAK, MD  torsemide  (DEMADEX ) 20 MG tablet Take 20-40 mg by mouth See admin instructions. Take one tablet (20 mg) by mouth on Monday, Wednesday, Friday, Sunday. Take two tablets (40 mg) by mouth on Tuesday, Thursday and Saturday.    [provider]  traZODone  (DESYREL ) 50 MG tablet Take 50 mg by mouth at bedtime.    [provider]  metoprolol  tartrate (LOPRESSOR ) 100 MG tablet Take 1 tablet (100 mg  total) by mouth 2 (two) times daily. 07/09/20 07/11/20  Laurita Pillion, MD    Allergies: Patient has no known allergies.    Review of Systems  Respiratory:  Positive for shortness of breath.     Updated Vital Signs BP (!) 141/93 (BP Location: Right Arm)   Pulse 78   Temp 98.9 F (37.2 C)   Resp 20   SpO2 94%   Physical Exam Vitals and nursing note reviewed.  Constitutional:      Appearance: He is well-developed.  HENT:     Head: Normocephalic and atraumatic.  Cardiovascular:     Rate and Rhythm: Normal rate.  Pulmonary:     Effort: Pulmonary effort is normal. Tachypnea present. No respiratory distress.     Breath sounds: Decreased breath sounds and wheezing present.  Abdominal:     General: There is no distension.  Musculoskeletal:        General: Normal range of motion.     Cervical back: Normal range of motion.     Right lower leg: No edema.     Left lower leg: No edema.  Skin:    General: Skin is warm and dry.  Neurological:     Mental Status: He is alert.     (all labs ordered are listed, but only abnormal results are displayed) Labs Reviewed  BASIC METABOLIC PANEL WITH GFR  CBC  BRAIN NATRIURETIC PEPTIDE  HEPATIC FUNCTION PANEL  TROPONIN I (HIGH SENSITIVITY)    EKG: None  Radiology: No results found.  {Document cardiac monitor, telemetry assessment procedure when appropriate:32947} Procedures   Medications Ordered in the ED - No data to display    {Click here for ABCD2, HEART and other calculators REFRESH Note before signing:1}                              Medical Decision Making Amount and/or Complexity of Data Reviewed Labs: ordered. Radiology: ordered.  No indication for workup of his hip without new factors. Will treat pain for now.   Breathing seems to be mostly related to COPD at this time. Will treat for same.  ***  {Document critical care time when appropriate  Document review of labs and clinical decision tools ie CHADS2VASC2,  etc  Document your independent review of radiology images and any outside records  Document your discussion with family members, caretakers and with consultants  Document social determinants of health affecting pt's care  Document your decision making why or why not admission, treatments were needed:32947:::1}   Final diagnoses:  None    ED Discharge Orders     None

## 2024-01-17 LAB — BRAIN NATRIURETIC PEPTIDE: B Natriuretic Peptide: 339.4 pg/mL — ABNORMAL HIGH (ref 0.0–100.0)

## 2024-01-17 LAB — TROPONIN I (HIGH SENSITIVITY)
Troponin I (High Sensitivity): 44 ng/L — ABNORMAL HIGH (ref <18)
Troponin I (High Sensitivity): 40 ng/L — ABNORMAL HIGH (ref <18)

## 2024-01-17 LAB — HEPATIC FUNCTION PANEL
ALT: 19 U/L (ref 0–44)
AST: 18 U/L (ref 15–41)
Albumin: 3.6 g/dL (ref 3.5–5.0)
Alkaline Phosphatase: 76 U/L (ref 38–126)
Bilirubin, Direct: <0.1 mg/dL (ref 0.0–0.2)
Total Bilirubin: 0.5 mg/dL (ref 0.0–1.2)
Total Protein: 7 g/dL (ref 6.5–8.1)

## 2024-01-17 LAB — BASIC METABOLIC PANEL WITH GFR
Anion gap: 10 (ref 5–15)
BUN: 36 mg/dL — ABNORMAL HIGH (ref 8–23)
CO2: 25 mmol/L (ref 22–32)
Calcium: 8.9 mg/dL (ref 8.9–10.3)
Chloride: 103 mmol/L (ref 98–111)
Creatinine, Ser: 2.5 mg/dL — ABNORMAL HIGH (ref 0.61–1.24)
GFR, Estimated: 29 mL/min — ABNORMAL LOW (ref >60)
Glucose, Bld: 95 mg/dL (ref 70–99)
Potassium: 4.4 mmol/L (ref 3.5–5.1)
Sodium: 138 mmol/L (ref 135–145)

## 2024-01-17 MED ORDER — OXYCODONE-ACETAMINOPHEN 5-325 MG PO TABS
2.0000 | ORAL_TABLET | Freq: Once | ORAL | Status: AC
  Administered 2024-01-17: 2 via ORAL
  Filled 2024-01-17: qty 2

## 2024-01-17 MED ORDER — OXYCODONE-ACETAMINOPHEN 5-325 MG PO TABS: 1.0000 | ORAL_TABLET | Freq: Three times a day (TID) | ORAL | 0 refills | Status: DC | PRN

## 2024-01-17 MED ORDER — DOXYCYCLINE HYCLATE 100 MG PO TABS
100.0000 mg | ORAL_TABLET | Freq: Once | ORAL | Status: AC
  Administered 2024-01-17: 100 mg via ORAL
  Filled 2024-01-17: qty 1

## 2024-01-17 MED ORDER — PREDNISONE 20 MG PO TABS
60.0000 mg | ORAL_TABLET | Freq: Once | ORAL | Status: AC
  Administered 2024-01-17: 60 mg via ORAL
  Filled 2024-01-17: qty 3

## 2024-01-17 MED ORDER — FUROSEMIDE 10 MG/ML IJ SOLN
40.0000 mg | Freq: Once | INTRAMUSCULAR | Status: AC
  Administered 2024-01-17: 40 mg via INTRAVENOUS
  Filled 2024-01-17: qty 4

## 2024-01-23 ENCOUNTER — Ambulatory Visit (HOSPITAL_BASED_OUTPATIENT_CLINIC_OR_DEPARTMENT_OTHER): Admitting: Internal Medicine

## 2024-01-23 DIAGNOSIS — Z5321 Procedure and treatment not carried out due to patient leaving prior to being seen by health care provider: Secondary | ICD-10-CM

## 2024-01-24 ENCOUNTER — Other Ambulatory Visit: Payer: Self-pay

## 2024-01-24 ENCOUNTER — Encounter: Payer: Self-pay | Admitting: *Deleted

## 2024-01-24 ENCOUNTER — Emergency Department

## 2024-01-24 ENCOUNTER — Ambulatory Visit: Admitting: Cardiology

## 2024-01-24 DIAGNOSIS — M1611 Unilateral primary osteoarthritis, right hip: Secondary | ICD-10-CM | POA: Diagnosis not present

## 2024-01-24 DIAGNOSIS — M25551 Pain in right hip: Secondary | ICD-10-CM | POA: Diagnosis not present

## 2024-01-24 DIAGNOSIS — M47814 Spondylosis without myelopathy or radiculopathy, thoracic region: Secondary | ICD-10-CM | POA: Diagnosis not present

## 2024-01-24 MED ORDER — ACETAMINOPHEN 325 MG PO TABS
650.0000 mg | ORAL_TABLET | Freq: Once | ORAL | Status: AC
  Administered 2024-01-24 (×2): 650 mg via ORAL
  Filled 2024-01-24: qty 2

## 2024-01-24 NOTE — ED Triage Notes (Addendum)
 Pt to triage via wheelchair.  Pt has right hip pain.  No recent fall or injury.  Sx for 1 year, worse pain in 4 months.  Pt alert  speech clear. Pt to have hip replacement 02/2024 at [facility].

## 2024-01-25 ENCOUNTER — Emergency Department
Admission: EM | Admit: 2024-01-25 | Discharge: 2024-01-25 | Disposition: A | Discharge status: Home / Self Care | Attending: Emergency Medicine | Admitting: Emergency Medicine

## 2024-01-25 DIAGNOSIS — M1611 Unilateral primary osteoarthritis, right hip: Secondary | ICD-10-CM

## 2024-01-25 DIAGNOSIS — M25551 Pain in right hip: Secondary | ICD-10-CM

## 2024-01-25 MED ORDER — MUSCLE RUB 10-15 % EX CREA: 1.0000 | TOPICAL_CREAM | CUTANEOUS | 0 refills | Status: AC | PRN

## 2024-01-25 MED ORDER — KETOROLAC TROMETHAMINE 15 MG/ML IJ SOLN
15.0000 mg | Freq: Once | INTRAMUSCULAR | Status: AC
  Administered 2024-01-25 (×2): 15 mg via INTRAMUSCULAR
  Filled 2024-01-25: qty 1

## 2024-01-25 MED ORDER — LIDOCAINE 5 % EX PTCH
1.0000 | MEDICATED_PATCH | CUTANEOUS | Status: DC
  Administered 2024-01-25 (×2): 1 via TRANSDERMAL
  Filled 2024-01-25: qty 1

## 2024-01-25 MED ORDER — MORPHINE SULFATE (PF) 4 MG/ML IV SOLN
6.0000 mg | Freq: Once | INTRAVENOUS | Status: AC
  Administered 2024-01-25 (×2): 6 mg via INTRAMUSCULAR
  Filled 2024-01-25: qty 2

## 2024-01-25 MED ORDER — LIDOCAINE 5 % EX PTCH: 1.0000 | MEDICATED_PATCH | CUTANEOUS | 0 refills | Status: AC

## 2024-01-25 MED ORDER — OXYCODONE HCL 10 MG PO TABS: 10.0000 mg | ORAL_TABLET | Freq: Four times a day (QID) | ORAL | 0 refills | Status: DC | PRN

## 2024-01-25 NOTE — Discharge Instructions (Signed)
 Speak with your surgeon about a follow-up appointment leading up to your surgery in September.  Take Tylenol  650 mg every 6 hours for pain.  Take with food.  Use the Lidoderm  patch or the muscle rub gel on the skin for pain relief as instructed.  Take oxycodone  as prescribed but no more than prescribed as this can cause problems if taken too much.  I made a referral to the pain clinic in [facility] who should call you for scheduling.  Thank you for choosing us  for your health care today!  Please see your primary doctor this week for a follow up appointment.   If you have any new, worsening, or unexpected symptoms call your doctor right away or come back to the emergency department for reevaluation.  It was my pleasure to care for you today.   Ginnie EDISON Cyrena, MD

## 2024-01-25 NOTE — ED Notes (Signed)
 This RN explained to pt that there's usually a required time of 15 mins after medication to monitor for effects before being discharged. Pt declined to wait 15 minutes after medication administration to leave.

## 2024-01-25 NOTE — Progress Notes (Signed)
 Pt left before being seen. Told CMA he will reschedule.

## 2024-01-25 NOTE — ED Provider Notes (Signed)
 [facility] Provider Note    Event Date/Time   First MD Initiated Contact with Patient 01/25/24 [phone]     (approximate)   History   Hip Pain   HPI  Chris [name] is a 63 y.o. male   Past medical history of multiple medical comorbidities but most pertinently chronic progressive right hip pain that has severely worsened after he ran out of his short course of oxycodone  prescribed during an emergency department visit recently.  There was no trauma or new injury.  There is no new quality location radiation of pain.  His typical of his longstanding right hip pain for which he has scheduled surgery in September, per patient report.  He has no other acute medical complaints today.  He was apparently referred to some sort of pain clinic that has not yet been able to schedule him.  He wants pain relief today and a referral to a new pain clinic to follow-up with.   External Medical Documents Reviewed: Prior hospital visit for pain to the same hip, and x-ray imaging at the time      Physical Exam   Triage Vital Signs: ED Triage Vitals  Encounter Vitals Group     BP 01/24/24 1910 (!) 145/80     Girls Systolic BP Percentile --      Girls Diastolic BP Percentile --      Boys Systolic BP Percentile --      Boys Diastolic BP Percentile --      Pulse Rate 01/24/24 1910 74     Resp 01/24/24 1910 18     Temp 01/24/24 1910 98.1 F (36.7 C)     Temp Source 01/24/24 1910 Oral     SpO2 01/24/24 1910 95 %     Weight 01/24/24 1922 281 lb (127.5 kg)     Height 01/24/24 1922 6' (1.829 m)     Head Circumference --      Peak Flow --      Pain Score 01/24/24 1922 10     Pain Loc --      Pain Education --      Exclude from Growth Chart --     Most recent vital signs: Vitals:   01/25/24 0333 01/25/24 0507  BP: (!) 144/81 (!) 182/107  Pulse: 68 77  Resp: 18 20  Temp: 97.8 F (36.6 C)   SpO2: 99% 100%    General: Awake, no distress.  CV:  Good peripheral  perfusion.  Resp:  Normal effort.  Abd:  No distention.  Other:  Pain with ranging but ability to range in the right hip.  Neurovascular intact to affected extremity.  Limb is warm and well-perfused.  Otherwise well-appearing hypertensive otherwise vital signs normal.   ED Results / Procedures / Treatments   Labs (all labs ordered are listed, but only abnormal results are displayed) Labs Reviewed - No data to display   RADIOLOGY I independently reviewed and interpreted right hip x-ray and see no obvious fracture or dislocation I also reviewed radiologist's formal read.   PROCEDURES:  Critical Care performed: no  Procedures   MEDICATIONS ORDERED IN ED: Medications  lidocaine  (LIDODERM ) 5 % 1 patch (1 patch Transdermal Patch Applied 01/25/24 0455)  acetaminophen  (TYLENOL ) tablet 650 mg (650 mg Oral Given 01/24/24 2226)  ketorolac  (TORADOL ) 15 MG/ML injection 15 mg (15 mg Intramuscular Given 01/25/24 0458)  morphine  (PF) 4 MG/ML injection 6 mg (6 mg Intramuscular Given 01/25/24 0502)    External  physician / consultants:  I spoke with  regarding care plan for this patient.   IMPRESSION / MDM / ASSESSMENT AND PLAN / ED COURSE  I reviewed the triage vital signs and the nursing notes.                                Patient's presentation is most consistent with   Differential diagnosis includes, but is not limited to, degenerative disease, osteoarthritis, considered but less likely fractures dislocation septic joint    MDM:    Longstanding hip pain most likely due to osteoarthritis in the queue for an upcoming hip replacement whose pain got worse after he ran out of the oxycodone .  I will give a short prescription for the same.  I will give a referral to our pain clinic.  I will give pain medications here in the emergency department, and he has a safe ride to take him home.  I considered but less likely acute traumatic injuries as he denies trauma and the x-ray does not show  any signs of traumatic injury, and is not consistent with septic joint.  He can follow-up with his primary doctor and surgeon and I gave him a referral to pain clinic.  He understands to return with any new or worsening symptoms.      FINAL CLINICAL IMPRESSION(S) / ED DIAGNOSES   Final diagnoses:  Right hip pain  Osteoarthritis of right hip, unspecified osteoarthritis type     Rx / DC Orders   ED Discharge Orders          Ordered    Ambulatory referral to Pain Clinic        01/25/24 0425    lidocaine  (LIDODERM ) 5 %  Every 24 hours        01/25/24 0427    Menthol -Methyl Salicylate (MUSCLE RUB) 10-15 % CREA  As needed        01/25/24 0427    oxyCODONE  10 MG TABS  Every 6 hours PRN        01/25/24 0427             Note:  This document was prepared using Dragon voice recognition software and may include unintentional dictation errors.    Cyrena Mylar, MD 01/25/24 [phone]

## 2024-01-26 NOTE — Telephone Encounter (Signed)
 Patient has been seen in the ED

## 2024-01-31 ENCOUNTER — Telehealth: Payer: Self-pay

## 2024-01-31 DIAGNOSIS — E1110 Type 2 diabetes mellitus with ketoacidosis without coma: Secondary | ICD-10-CM

## 2024-01-31 NOTE — Progress Notes (Signed)
 Complex Care Management Note  Care Guide Note 01/31/2024 Name: Chris [name] MRN: [id number] DOB: 11/17/1960  [name] is a 63 y.o. year old male who sees Louder Barnie NOVAK, MD for primary care. I reached out to [name] by phone today to offer complex care management services.  [name] was given information about Complex Care Management services today including:   The Complex Care Management services include support from the care team which includes your Nurse Care Manager, Clinical Social Worker, or Pharmacist.  The Complex Care Management team is here to help remove barriers to the health concerns and goals most important to you. Complex Care Management services are voluntary, and the patient may decline or stop services at any time by request to their care team member.   Complex Care Management Consent Status: Patient did not agree to participate in complex care management services at this time.  Encounter Outcome:  Patient Refused  Dreama Agent [facility], [facility] [employer] Assistant Direct Dial : [phone]  Fax: [phone]

## 2024-02-01 ENCOUNTER — Telehealth: Payer: Self-pay | Admitting: Internal Medicine

## 2024-02-01 ENCOUNTER — Ambulatory Visit: Admitting: Orthopaedic Surgery

## 2024-02-01 VITALS — Ht 72.0 in | Wt 281.0 lb

## 2024-02-01 DIAGNOSIS — M1611 Unilateral primary osteoarthritis, right hip: Secondary | ICD-10-CM | POA: Diagnosis not present

## 2024-02-01 MED ORDER — TIZANIDINE HCL 4 MG PO TABS: 4.0000 mg | ORAL_TABLET | Freq: Four times a day (QID) | ORAL | 0 refills | Status: DC | PRN

## 2024-02-01 NOTE — Telephone Encounter (Signed)
 pt confirmed appt per volunteer

## 2024-02-01 NOTE — Progress Notes (Signed)
 The patient is a 63 year old gentleman referred to me by my partner Dr. Addie due to the need for a right hip replacement.  The patient been dealing with severe pain in his right hip for some time now.  He does ambulate with a cane.  He has had an intra-articular injection which did not help him at all he states.  I did review his past medical history and medications within epic.  He is a diabetic.  He needs to see his primary care physician soon due to chronic asthma issues.  He does have severe pain in that right hip and is requesting pain medications.  On exam I can barely even rotate his right hip due to severity of pain and a lot of guarding with his right hip.  His left hip exam is normal.  Standing AP pelvis and lateral the right hip shows arthritic changes with superior lateral joint space narrowing and osteophytes around the right hip.  The left hip joint space is well-maintained and I did show this and shared this with the patient.  I did give him a handout about hip replacement surgery and describe what the surgery involves show and hip replacement model.  I described the risks and benefits of the surgery as well.  I did recommend that he see his primary care physician as soon as possible but we will work on going ahead and getting him on the schedule.  He did request pain medications but I will send in his muscle relaxant.  I will not put anybody on narcotics prior to surgery.

## 2024-02-02 ENCOUNTER — Encounter: Payer: Self-pay | Admitting: Family Medicine

## 2024-02-02 ENCOUNTER — Ambulatory Visit: Attending: Family Medicine | Admitting: Family Medicine

## 2024-02-02 DIAGNOSIS — J42 Unspecified chronic bronchitis: Secondary | ICD-10-CM

## 2024-02-02 MED ORDER — BENZONATATE 100 MG PO CAPS: 100.0000 mg | ORAL_CAPSULE | Freq: Two times a day (BID) | ORAL | 0 refills | Status: DC | PRN

## 2024-02-02 MED ORDER — DULOXETINE HCL 60 MG PO CPEP: 60.0000 mg | ORAL_CAPSULE | Freq: Every day | ORAL | 0 refills | Status: AC

## 2024-02-02 NOTE — Patient Instructions (Signed)
 VISIT SUMMARY:  Today, you were seen for severe right hip pain and a chronic cough with thick white sputum. We discussed your current symptoms, evaluated your pain management plan, and reviewed your medications.  YOUR PLAN:  -RIGHT HIP OSTEOARTHRITIS WITH SEVERE PAIN: You have severe osteoarthritis in your right hip, which means the cartilage in your hip joint has worn down, causing bone-on-bone contact and significant pain. We have scheduled your surgery for the second. Since your current medication, tizanidine , is not providing relief and NSAIDs are not an option due to your use of Eliquis , we have prescribed duloxetine  (Cymbalta ) to help manage your pain. Please take it once daily as directed.  -CHRONIC COUGH WITH THICK WHITE SPUTUM AFTER SMOKING CESSATION: You have a chronic cough with thick white sputum, which is common after quitting smoking. Since Mucinex  did not help, we have prescribed Tessalon  Perles to help with your cough and mucus. Please take it as directed.  INSTRUCTIONS:  Please follow up as scheduled for your surgery on the second. If you have any questions or concerns about your new medications, contact our office.

## 2024-02-02 NOTE — Progress Notes (Signed)
 Subjective:  Patient ID: Chris [name], male    DOB: 1960/08/16  Age: 63 y.o. MRN: [id number]  CC: Hip Pain (Right hip pain)     Discussed the use of AI scribe software for clinical note transcription with the patient, who gave verbal consent to proceed.  History of Present Illness Chris [name] is a 63 year old male patient of Dr. Louder who presents with a history of type 2 diabetes mellitus, hypertension, osteoarthritis of the right hip, gout, MDD, chronic diastolic heart failure right hip pain for pain management and pre-surgical evaluation.  He experiences severe right hip pain, rated as 10 out of 10, persistent and worsening. The pain radiates to the groin and down the leg, significantly affecting daily activities such as getting in and out of bed and stepping into the tub. The sensation is described as 'bone to bone', impacting his sleep and weight.  He is currently taking tizanidine  every six hours without effective pain relief. He has previously tried gabapentin without success. He had a visit with Dr. Vernetta his orthopedic yesterday and is being worked up for right total hip arthroplasty in 2 weeks.  He also complains of a chronic cough since he quit smoking and this is associated with production of phlegm and is requesting something for it as OTC antitussives have been ineffective  Past Medical History:  Diagnosis Date   Arrhythmia    atrial flutter   CHF (congestive heart failure) (HCC)    Chronic kidney disease    COPD (chronic obstructive pulmonary disease) (HCC)    Coronary artery disease    Depression    Diabetes mellitus without complication (HCC)    GERD (gastroesophageal reflux disease)    Gout    Hypertension    Influenza A with respiratory manifestations    Mental disorder     Past Surgical History:  Procedure Laterality Date   ANKLE SURGERY     CARDIAC CATHETERIZATION     CARDIOVERSION N/A 07/08/2020   Procedure: CARDIOVERSION;  Surgeon:  Hester Wolm PARAS, MD;  Location: [facility];  Service: Cardiovascular;  Laterality: N/A;   COLONOSCOPY WITH PROPOFOL  N/A 11/19/2021   Procedure: COLONOSCOPY WITH PROPOFOL ;  Surgeon: Stacia Glendia BRAVO, MD;  Location: [facility];  Service: Gastroenterology;  Laterality: N/A;   HERNIA REPAIR     x2   IR RADIOLOGIST EVAL & MGMT  05/13/2022   IR RADIOLOGIST EVAL & MGMT  10/27/2022   IR RADIOLOGIST EVAL & MGMT  03/02/2023   IR RADIOLOGIST EVAL & MGMT  10/31/2023   POLYPECTOMY  11/19/2021   Procedure: POLYPECTOMY;  Surgeon: Stacia Glendia BRAVO, MD;  Location: [facility];  Service: Gastroenterology;;   RADIOLOGY WITH ANESTHESIA N/A 06/23/2022   Procedure: Right renal tumor ablation;  Surgeon: Jennefer Ester PARAS, MD;  Location: [facility] OR;  Service: Radiology;  Laterality: N/A;   RIGHT HEART CATH N/A 07/13/2022   Procedure: RIGHT HEART CATH;  Surgeon: Rolan Ezra RAMAN, MD;  Location: [facility] INVASIVE CV LAB;  Service: Cardiovascular;  Laterality: N/A;   RIGHT/LEFT HEART CATH AND CORONARY ANGIOGRAPHY N/A 11/19/2022   Procedure: RIGHT/LEFT HEART CATH AND CORONARY ANGIOGRAPHY;  Surgeon: Rolan Ezra RAMAN, MD;  Location: [facility] INVASIVE CV LAB;  Service: Cardiovascular;  Laterality: N/A;   SHOULDER SURGERY     TEE WITHOUT CARDIOVERSION N/A 07/08/2020   Procedure: TRANSESOPHAGEAL ECHOCARDIOGRAM (TEE);  Surgeon: Hester Wolm PARAS, MD;  Location: [facility];  Service: Cardiovascular;  Laterality: N/A;    Family History  Problem Relation  Age of Onset   Heart disease Father    Diabetes Mother    HIV Brother    Healthy Son    Healthy Daughter     Social History   Socioeconomic History   Marital status: Divorced    Spouse name: Not on file   Number of children: 3   Years of education: Not on file   Highest education level: High school graduate  Occupational History   Occupation: disability  Tobacco Use   Smoking status: Former    Current packs/day: 1.00    Average packs/day: 1 pack/day for 43.0 years (43.0 ttl pk-yrs)     Types: Cigarettes   Smokeless tobacco: Never   Tobacco comments:        6 cigs daily--03/28/2023  Vaping Use   Vaping status: Never Used  Substance and Sexual Activity   Alcohol use: Yes    Alcohol/week: 4.0 standard drinks of alcohol    Types: 4 Shots of liquor per week   Drug use: Yes    Frequency: 21.0 times per week    Types: Marijuana    Comment: last use Cocaine- 03/28/2021. Still using marijuana, last use 06/22/21   Sexual activity: Not on file  Other Topics Concern   Not on file  Social History Narrative   ** Merged History Encounter **       Social Drivers of Health   Financial Resource Strain: High Risk (07/26/2023)   Overall Financial Resource Strain (CARDIA)    Difficulty of Paying Living Expenses: Very hard  Food Insecurity: Food Insecurity Present (07/26/2023)   Hunger Vital Sign    Worried About Running Out of Food in the Last Year: Sometimes true    Ran Out of Food in the Last Year: Sometimes true  Transportation Needs: Unmet Transportation Needs (07/26/2023)   PRAPARE - Administrator, Civil Service (Medical): Yes    Lack of Transportation (Non-Medical): Yes  Physical Activity: Insufficiently Active (11/16/2021)   Exercise Vital Sign    Days of Exercise per Week: 5 days    Minutes of Exercise per Session: 20 min  Stress: Stress Concern Present (07/26/2023)   [employer] of Occupational Health - Occupational Stress Questionnaire    Feeling of Stress : Very much  Social Connections: Moderately Integrated (07/26/2023)   Social Connection and Isolation Panel    Frequency of Communication with Friends and Family: More than three times a week    Frequency of Social Gatherings with Friends and Family: Once a week    Attends Religious Services: 1 to 4 times per year    Active Member of [employer] or Organizations: Yes    Attends Banker Meetings: Never    Marital Status: Divorced    No Known Allergies  Outpatient Medications Prior to  Visit  Medication Sig Dispense Refill   acetaminophen  (TYLENOL ) 500 MG tablet Take 1-2 tablets (500-1,000 mg total) by mouth every 6 (six) hours as needed (pain.). 30 tablet 0   albuterol  (PROVENTIL ) (2.5 MG/3ML) 0.083% nebulizer solution Take 3 mLs (2.5 mg total) by nebulization every 4 (four) hours as needed for wheezing or shortness of breath. 300 mL 2   albuterol  (PROVENTIL ) (2.5 MG/3ML) 0.083% nebulizer solution Take 3 mLs (2.5 mg total) by nebulization every 4 (four) hours as needed for wheezing or shortness of breath. 75 mL 6   albuterol  (VENTOLIN  HFA) 108 (90 Base) MCG/ACT inhaler Inhale 2 puffs into the lungs every 6 (six) hours as needed for wheezing  or shortness of breath. 54 g 6   allopurinol  (ZYLOPRIM ) 100 MG tablet TAKE 2 TABLETS (200 MG TOTAL) BY MOUTH DAILY. 60 tablet 6   atorvastatin  (LIPITOR) 80 MG tablet Take 1 tablet (80 mg total) by mouth daily. (Patient taking differently: Take 80 mg by mouth at bedtime.) 90 tablet 1   carvedilol  (COREG ) 12.5 MG tablet Take 1 tablet (12.5 mg total) by mouth 2 (two) times daily. 180 tablet 3   carvedilol  (COREG ) 12.5 MG tablet Take 1.5 tablets (18.75 mg total) by mouth 2 (two) times daily with a meal. 270 tablet 1   colchicine  0.6 MG tablet Take 0.5 tablets (0.3 mg total) by mouth three times a week. 15 tablet 2   dapagliflozin  propanediol (FARXIGA ) 10 MG TABS tablet Take 1 tablet (10 mg total) by mouth daily. 90 tablet 3   ELIQUIS  5 MG TABS tablet TAKE 1 TABLET BY MOUTH 2 (TWO) TIMES DAILY (AM+PM) 60 tablet 11   fluticasone -salmeterol (ADVAIR HFA) 230-21 MCG/ACT inhaler Inhale 2 puffs into the lungs 2 (two) times daily. 1 each 12   isosorbide -hydrALAZINE  (BIDIL ) 20-37.5 MG tablet Take 1 tablet by mouth 3 (three) times daily. 270 tablet 3   lidocaine  (LIDODERM ) 5 % Place 1 patch onto the skin daily for 10 days. Remove & Discard patch within 12 hours or as directed by MD 10 patch 0   Menthol -Methyl Salicylate (MUSCLE RUB) 10-15 % CREA Apply 1  Application topically as needed. 85 g 0   mirtazapine  (REMERON ) 15 MG tablet Take 0.5 tablets (7.5 mg total) by mouth at bedtime. 45 tablet 0   oxyCODONE  10 MG TABS Take 1 tablet (10 mg total) by mouth every 6 (six) hours as needed for up to 16 doses for severe pain (pain score 7-10). 16 tablet 0   potassium chloride  (KLOR-CON ) 10 MEQ tablet Take 2 tablets (20 mEq total) by mouth daily. 180 tablet 2   Tiotropium Bromide  Monohydrate (SPIRIVA  RESPIMAT) 2.5 MCG/ACT AERS Inhale 2 puffs into the lungs daily. 4 g 6   tiZANidine  (ZANAFLEX ) 4 MG tablet Take 1 tablet (4 mg total) by mouth every 6 (six) hours as needed for muscle spasms. 30 tablet 0   torsemide  (DEMADEX ) 20 MG tablet Take 20-40 mg by mouth See admin instructions. Take one tablet (20 mg) by mouth on Monday, Wednesday, Friday, Sunday. Take two tablets (40 mg) by mouth on Tuesday, Thursday and Saturday.     traZODone  (DESYREL ) 50 MG tablet Take 50 mg by mouth at bedtime.     No facility-administered medications prior to visit.     ROS Review of Systems  Constitutional:  Negative for activity change and appetite change.  HENT:  Negative for sinus pressure and sore throat.   Respiratory:  Positive for cough. Negative for chest tightness, shortness of breath and wheezing.   Cardiovascular:  Negative for chest pain and palpitations.  Gastrointestinal:  Negative for abdominal distention, abdominal pain and constipation.  Genitourinary: Negative.   Musculoskeletal:        See HPI  Psychiatric/Behavioral:  Negative for behavioral problems and dysphoric mood.     Objective:  BP (!) 145/88   Pulse 70   Ht 6' (1.829 m)   Wt 272 lb 9.6 oz (123.7 kg)   SpO2 96%   BMI 36.97 kg/m      02/02/2024    9:17 AM 02/01/2024    9:36 AM 01/25/2024    5:07 AM  BP/Weight  Systolic BP 145  817  Diastolic  BP 88  107  Wt. (Lbs) 272.6 281   BMI 36.97 kg/m2 38.11 kg/m2       Physical Exam Constitutional:      Appearance: He is well-developed.   Cardiovascular:     Rate and Rhythm: Normal rate.     Heart sounds: Normal heart sounds. No murmur heard. Pulmonary:     Effort: Pulmonary effort is normal.     Breath sounds: Normal breath sounds. No wheezing or rales.  Chest:     Chest wall: No tenderness.  Abdominal:     General: Bowel sounds are normal. There is no distension.     Palpations: Abdomen is soft. There is no mass.     Tenderness: There is no abdominal tenderness.  Musculoskeletal:     Right lower leg: No edema.     Left lower leg: No edema.     Comments: Severely restricted ROM in right hip with associated tenderness  Neurological:     Mental Status: He is alert and oriented to person, place, and time.  Psychiatric:        Mood and Affect: Mood normal.        Latest Ref Rng & Units 01/16/2024   11:03 PM 08/18/2023    8:11 AM 05/18/2023    8:44 AM  CMP  Glucose 70 - 99 mg/dL 95  888  896   BUN 8 - 23 mg/dL 36  45  38   Creatinine 0.61 - 1.24 mg/dL 7.49  7.65  7.66   Sodium 135 - 145 mmol/L 138  136  136   Potassium 3.5 - 5.1 mmol/L 4.4  4.6  5.2   Chloride 98 - 111 mmol/L 103  106  99   CO2 22 - 32 mmol/L 25  22  28    Calcium  8.9 - 10.3 mg/dL 8.9  8.6  8.9   Total Protein 6.5 - 8.1 g/dL 7.0   7.0   Total Bilirubin 0.0 - 1.2 mg/dL 0.5   0.6   Alkaline Phos 38 - 126 U/L 76   76   AST 15 - 41 U/L 18   22   ALT 0 - 44 U/L 19   20     Lipid Panel     Component Value Date/Time   CHOL 155 02/25/2023 1120   CHOL 160 11/07/2019 0918   CHOL 157 10/21/2013 0435   TRIG 376 (H) 02/25/2023 1120   TRIG 117 10/21/2013 0435   HDL 27 (L) 02/25/2023 1120   HDL 40 11/07/2019 0918   HDL 30 (L) 10/21/2013 0435   CHOLHDL 5.7 02/25/2023 1120   VLDL 75 (H) 02/25/2023 1120   VLDL 23 10/21/2013 0435   LDLCALC 53 02/25/2023 1120   LDLCALC 92 11/07/2019 0918   LDLCALC 104 (H) 10/21/2013 0435    CBC    Component Value Date/Time   WBC 9.3 01/16/2024 2303   RBC 4.84 01/16/2024 2303   HGB 14.5 01/16/2024 2303   HGB  15.4 11/07/2019 0918   HCT 45.8 01/16/2024 2303   HCT 46.2 11/07/2019 0918   PLT 209 01/16/2024 2303   PLT 245 11/07/2019 0918   MCV 94.6 01/16/2024 2303   MCV 90 11/07/2019 0918   MCV 92 10/21/2013 0435   MCH 30.0 01/16/2024 2303   MCHC 31.7 01/16/2024 2303   RDW 14.9 01/16/2024 2303   RDW 14.2 11/07/2019 0918   RDW 15.7 (H) 10/21/2013 0435   LYMPHSABS 2.1 12/09/2022 0930   LYMPHSABS 1.1 10/21/2013 0435  MONOABS 0.8 12/09/2022 0930   MONOABS 0.6 10/21/2013 0435   EOSABS 0.1 12/09/2022 0930   EOSABS 0.0 10/21/2013 0435   BASOSABS 0.1 12/09/2022 0930   BASOSABS 0.0 10/21/2013 0435    Lab Results  Component Value Date   HGBA1C 6.6 07/26/2023       Assessment & Plan Right hip osteoarthritis with severe pain Severe osteoarthritis causing significant pain, affecting sleep and daily activities. Scheduled for surgery on the second of September. Current pain management with tizanidine  is ineffective. Gabapentin was previously tried without success. NSAIDs contraindicated due to Eliquis  use. Narcotics not prescribed pre-surgery per orthopedic notes. Duloxetine  considered for non-narcotic pain management. - Prescribed duloxetine  (Cymbalta ) once daily for pain management. - Sent prescription to [facility].  Chronic cough with thick white sputum after smoking cessation Chronic cough with thick white sputum post-smoking cessation. Mucinex  previously tried without relief. - Prescribed Tessalon  Perles for cough and mucus expectoration. - Sent prescription to [facility]. - Continue albuterol        Meds ordered this encounter  Medications   DULoxetine  (CYMBALTA ) 60 MG capsule    Sig: Take 1 capsule (60 mg total) by mouth daily. For pain    Dispense:  30 capsule    Refill:  0   benzonatate  (TESSALON ) 100 MG capsule    Sig: Take 1 capsule (100 mg total) by mouth 2 (two) times daily as needed for cough.    Dispense:  20 capsule    Refill:  0    Follow-up: Return for  previously scheduled appointment.       Corrina Sabin, MD, FAAFP. [facility] [place], [place] [phone]   02/02/2024, 12:41 PM

## 2024-02-06 ENCOUNTER — Other Ambulatory Visit: Payer: Self-pay | Admitting: Physician Assistant

## 2024-02-06 DIAGNOSIS — Z01818 Encounter for other preprocedural examination: Secondary | ICD-10-CM

## 2024-02-07 NOTE — Pre-Procedure Instructions (Signed)
 Surgical Instructions   Your procedure is scheduled on February 14, 2024. Report to [facility] Main Entrance A at 5:30 A.M., then check in with the Admitting office. Any questions or running late day of surgery: call [phone]  Questions prior to your surgery date: call [phone], Monday-Friday, 8am-4pm. If you experience any cold or flu symptoms such as cough, fever, chills, shortness of breath, etc. between now and your scheduled surgery, please notify us  at the above number.     Remember:  Do not eat after midnight the night before your surgery  You may drink clear liquids until 4:30 AM the morning of your surgery.   Clear liquids allowed are: Water, Non-Citrus Juices (without pulp), Carbonated Beverages, Clear Tea (no milk, honey, etc.), Black Coffee Only (NO MILK, CREAM OR POWDERED CREAMER of any kind), and Gatorade.  Patient Instructions  The night before surgery:  No food after midnight. ONLY clear liquids after midnight  The day of surgery (if you have diabetes): Drink ONE (1) 12 oz G2 given to you in your pre admission testing appointment by 4:30 AM the morning of surgery. Drink in one sitting. Do not sip.  This drink was given to you during your hospital  pre-op appointment visit.  Nothing else to drink after completing the  12 oz bottle of G2.         If you have questions, please contact your surgeon's office.   Take these medicines the morning of surgery with A SIP OF WATER: allopurinol  (ZYLOPRIM )  carvedilol  (COREG )  DULoxetine  (CYMBALTA )  fluticasone -salmeterol (ADVAIR HFA) inhaler isosorbide -hydrALAZINE  (BIDIL )  Tiotropium Bromide  Monohydrate (SPIRIVA  RESPIMAT)    May take these medicines IF NEEDED: albuterol  (PROVENTIL ) nebulizer solution  albuterol  (VENTOLIN  HFA) inhaler - please bring inhaler with you morning of surgery colchicine   oxyCODONE   tiZANidine  (ZANAFLEX )    Follow your surgeon's instructions on when to stop ELIQUIS .  If no  instructions were given by your surgeon then you will need to call the office to get those instructions.     One week prior to surgery, STOP taking any Aspirin  (unless otherwise instructed by your surgeon) Aleve , Naproxen , Ibuprofen , Motrin , Advil , Goody's, BC's, all herbal medications, fish oil, and non-prescription vitamins.   WHAT DO I DO ABOUT MY DIABETES MEDICATION?   STOP taking your dapagliflozin  propanediol (FARXIGA ) three days prior to surgery. Your last dose will be August 29th.   HOW TO MANAGE YOUR DIABETES BEFORE AND AFTER SURGERY  Why is it important to control my blood sugar before and after surgery? Improving blood sugar levels before and after surgery helps healing and can limit problems. A way of improving blood sugar control is eating a healthy diet by:  Eating less sugar and carbohydrates  Increasing activity/exercise  Talking with your doctor about reaching your blood sugar goals High blood sugars (greater than 180 mg/dL) can raise your risk of infections and slow your recovery, so you will need to focus on controlling your diabetes during the weeks before surgery. Make sure that the doctor who takes care of your diabetes knows about your planned surgery including the date and location.  How do I manage my blood sugar before surgery? Check your blood sugar at least 4 times a day, starting 2 days before surgery, to make sure that the level is not too high or low.  Check your blood sugar the morning of your surgery when you wake up and every 2 hours until you get to the Short Stay unit.  If  your blood sugar is less than 70 mg/dL, you will need to treat for low blood sugar: Do not take insulin . Treat a low blood sugar (less than 70 mg/dL) with  cup of clear juice (cranberry or apple), 4 glucose tablets, OR glucose gel. Recheck blood sugar in 15 minutes after treatment (to make sure it is greater than 70 mg/dL). If your blood sugar is not greater than 70 mg/dL on  recheck, call [phone] for further instructions. Report your blood sugar to the short stay nurse when you get to Short Stay.  If you are admitted to the hospital after surgery: Your blood sugar will be checked by the staff and you will probably be given insulin  after surgery (instead of oral diabetes medicines) to make sure you have good blood sugar levels. The goal for blood sugar control after surgery is 80-180 mg/dL.                      Do NOT Smoke (Tobacco/Vaping) for 24 hours prior to your procedure.  If you use a CPAP at night, you may bring your mask/headgear for your overnight stay.   You will be asked to remove any contacts, glasses, piercing's, hearing aid's, dentures/partials prior to surgery. Please bring cases for these items if needed.    Patients discharged the day of surgery will not be allowed to drive home, and someone needs to stay with them for 24 hours.  SURGICAL WAITING ROOM VISITATION Patients may have no more than 2 support people in the waiting area - these visitors may rotate.   Pre-op nurse will coordinate an appropriate time for 1 ADULT support person, who may not rotate, to accompany patient in pre-op.  Children under the age of 86 must have an adult with them who is not the patient and must remain in the main waiting area with an adult.  If the patient needs to stay at the hospital during part of their recovery, the visitor guidelines for inpatient rooms apply.  Please refer to the [facility] website for the visitor guidelines for any additional information.   If you received a COVID test during your pre-op visit  it is requested that you wear a mask when out in public, stay away from anyone that may not be feeling well and notify your surgeon if you develop symptoms. If you have been in contact with anyone that has tested positive in the last 10 days please notify you surgeon.      Pre-operative 5 CHG Bathing Instructions   You can play a key  role in reducing the risk of infection after surgery. Your skin needs to be as free of germs as possible. You can reduce the number of germs on your skin by washing with CHG (chlorhexidine  gluconate) soap before surgery. CHG is an antiseptic soap that kills germs and continues to kill germs even after washing.   DO NOT use if you have an allergy to chlorhexidine /CHG or antibacterial soaps. If your skin becomes reddened or irritated, stop using the CHG and notify one of our RNs at [phone].   Please shower with the CHG soap starting 4 days before surgery using the following schedule:     Please keep in mind the following:  DO NOT shave, including legs and underarms, starting the day of your first shower.   You may shave your face at any point before/day of surgery.  Place clean sheets on your bed the day you start using  CHG soap. Use a clean washcloth (not used since being washed) for each shower. DO NOT sleep with pets once you start using the CHG.   CHG Shower Instructions:  Wash your face and private area with normal soap. If you choose to wash your hair, wash first with your normal shampoo.  After you use shampoo/soap, rinse your hair and body thoroughly to remove shampoo/soap residue.  Turn the water OFF and apply about 3 tablespoons (45 ml) of CHG soap to a CLEAN washcloth.  Apply CHG soap ONLY FROM YOUR NECK DOWN TO YOUR TOES (washing for 3-5 minutes)  DO NOT use CHG soap on face, private areas, open wounds, or sores.  Pay special attention to the area where your surgery is being performed.  If you are having back surgery, having someone wash your back for you may be helpful. Wait 2 minutes after CHG soap is applied, then you may rinse off the CHG soap.  Pat dry with a clean towel  Put on clean clothes/pajamas   If you choose to wear lotion, please use ONLY the CHG-compatible lotions that are listed below.  Additional instructions for the day of surgery: DO NOT APPLY any  lotions, deodorants, cologne, or perfumes.   Do not bring valuables to the hospital. [facility] is not responsible for any belongings/valuables. Do not wear nail polish, gel polish, artificial nails, or any other type of covering on natural nails (fingers and toes) Do not wear jewelry or makeup Put on clean/comfortable clothes.  Please brush your teeth.  Ask your nurse before applying any prescription medications to the skin.     CHG Compatible Lotions   Aveeno Moisturizing lotion  Cetaphil Moisturizing Cream  Cetaphil Moisturizing Lotion  Clairol Herbal Essence Moisturizing Lotion, Dry Skin  Clairol Herbal Essence Moisturizing Lotion, Extra Dry Skin  Clairol Herbal Essence Moisturizing Lotion, Normal Skin  Curel Age Defying Therapeutic Moisturizing Lotion with Alpha Hydroxy  Curel Extreme Care Body Lotion  Curel Soothing Hands Moisturizing Hand Lotion  Curel Therapeutic Moisturizing Cream, Fragrance-Free  Curel Therapeutic Moisturizing Lotion, Fragrance-Free  Curel Therapeutic Moisturizing Lotion, Original Formula  Eucerin Daily Replenishing Lotion  Eucerin Dry Skin Therapy Plus Alpha Hydroxy Crme  Eucerin Dry Skin Therapy Plus Alpha Hydroxy Lotion  Eucerin Original Crme  Eucerin Original Lotion  Eucerin Plus Crme Eucerin Plus Lotion  Eucerin TriLipid Replenishing Lotion  Keri Anti-Bacterial Hand Lotion  Keri Deep Conditioning Original Lotion Dry Skin Formula Softly Scented  Keri Deep Conditioning Original Lotion, Fragrance Free Sensitive Skin Formula  Keri Lotion Fast Absorbing Fragrance Free Sensitive Skin Formula  Keri Lotion Fast Absorbing Softly Scented Dry Skin Formula  Keri Original Lotion  Keri Skin Renewal Lotion Keri Silky Smooth Lotion  Keri Silky Smooth Sensitive Skin Lotion  Nivea Body Creamy Conditioning Oil  Nivea Body Extra Enriched Lotion  Nivea Body Original Lotion  Nivea Body Sheer Moisturizing Lotion Nivea Crme  Nivea Skin Firming Lotion   NutraDerm 30 Skin Lotion  NutraDerm Skin Lotion  NutraDerm Therapeutic Skin Cream  NutraDerm Therapeutic Skin Lotion  ProShield Protective Hand Cream  Provon moisturizing lotion  Please read over the following fact sheets that you were given.

## 2024-02-08 ENCOUNTER — Other Ambulatory Visit: Payer: Self-pay

## 2024-02-08 ENCOUNTER — Encounter (HOSPITAL_COMMUNITY): Payer: Self-pay

## 2024-02-08 ENCOUNTER — Encounter (HOSPITAL_COMMUNITY)
Admission: RE | Admit: 2024-02-08 | Discharge: 2024-02-08 | Disposition: A | Discharge status: Home / Self Care | Source: Ambulatory Visit | Attending: Orthopaedic Surgery | Admitting: Orthopaedic Surgery

## 2024-02-08 VITALS — BP 143/79 | HR 79 | Temp 97.8°F | Resp 19 | Ht 72.0 in | Wt 270.0 lb

## 2024-02-08 DIAGNOSIS — N183 Chronic kidney disease, stage 3 unspecified: Secondary | ICD-10-CM | POA: Diagnosis not present

## 2024-02-08 DIAGNOSIS — Z79899 Other long term (current) drug therapy: Secondary | ICD-10-CM | POA: Insufficient documentation

## 2024-02-08 DIAGNOSIS — Z87891 Personal history of nicotine dependence: Secondary | ICD-10-CM | POA: Insufficient documentation

## 2024-02-08 DIAGNOSIS — J449 Chronic obstructive pulmonary disease, unspecified: Secondary | ICD-10-CM | POA: Diagnosis not present

## 2024-02-08 DIAGNOSIS — Z01818 Encounter for other preprocedural examination: Secondary | ICD-10-CM | POA: Diagnosis present

## 2024-02-08 DIAGNOSIS — Z01812 Encounter for preprocedural laboratory examination: Secondary | ICD-10-CM | POA: Diagnosis not present

## 2024-02-08 DIAGNOSIS — E1122 Type 2 diabetes mellitus with diabetic chronic kidney disease: Secondary | ICD-10-CM | POA: Diagnosis not present

## 2024-02-08 DIAGNOSIS — F1411 Cocaine abuse, in remission: Secondary | ICD-10-CM | POA: Diagnosis not present

## 2024-02-08 DIAGNOSIS — I5022 Chronic systolic (congestive) heart failure: Secondary | ICD-10-CM | POA: Diagnosis not present

## 2024-02-08 DIAGNOSIS — I130 Hypertensive heart and chronic kidney disease with heart failure and stage 1 through stage 4 chronic kidney disease, or unspecified chronic kidney disease: Secondary | ICD-10-CM | POA: Insufficient documentation

## 2024-02-08 DIAGNOSIS — I4892 Unspecified atrial flutter: Secondary | ICD-10-CM | POA: Diagnosis not present

## 2024-02-08 LAB — CBC
HCT: 46.4 % (ref 39.0–52.0)
Hemoglobin: 14.9 g/dL (ref 13.0–17.0)
MCH: 29.6 pg (ref 26.0–34.0)
MCHC: 32.1 g/dL (ref 30.0–36.0)
MCV: 92.1 fL (ref 80.0–100.0)
Platelets: 177 K/uL (ref 150–400)
RBC: 5.04 MIL/uL (ref 4.22–5.81)
RDW: 15.6 % — ABNORMAL HIGH (ref 11.5–15.5)
WBC: 9.5 K/uL (ref 4.0–10.5)
nRBC: 0 % (ref 0.0–0.2)

## 2024-02-08 LAB — BASIC METABOLIC PANEL WITH GFR
Anion gap: 9 (ref 5–15)
BUN: 33 mg/dL — ABNORMAL HIGH (ref 8–23)
CO2: 22 mmol/L (ref 22–32)
Calcium: 9 mg/dL (ref 8.9–10.3)
Chloride: 105 mmol/L (ref 98–111)
Creatinine, Ser: 2.27 mg/dL — ABNORMAL HIGH (ref 0.61–1.24)
GFR, Estimated: 32 mL/min — ABNORMAL LOW (ref >60)
Glucose, Bld: 85 mg/dL (ref 70–99)
Potassium: 4.3 mmol/L (ref 3.5–5.1)
Sodium: 136 mmol/L (ref 135–145)

## 2024-02-08 LAB — GLUCOSE, CAPILLARY: Glucose-Capillary: 114 mg/dL — ABNORMAL HIGH (ref 70–99)

## 2024-02-08 LAB — TYPE AND SCREEN
ABO/RH(D): O POS
Antibody Screen: NEGATIVE

## 2024-02-08 LAB — SURGICAL PCR SCREEN
MRSA, PCR: NEGATIVE
Staphylococcus aureus: NEGATIVE

## 2024-02-08 NOTE — Pre-Procedure Instructions (Signed)
 Surgical Instructions   Your procedure is scheduled on February 14, 2024. Report to [facility] Main Entrance A at 5:30 A.M., then check in with the Admitting office. Any questions or running late day of surgery: call [phone]  Questions prior to your surgery date: call [phone], Monday-Friday, 8am-4pm. If you experience any cold or flu symptoms such as cough, fever, chills, shortness of breath, etc. between now and your scheduled surgery, please notify us  at the above number.     Remember:  Do not eat after midnight the night before your surgery  You may drink clear liquids until 4:30 AM the morning of your surgery.   Clear liquids allowed are: Water, Non-Citrus Juices (without pulp), Carbonated Beverages, Clear Tea (no milk, honey, etc.), Black Coffee Only (NO MILK, CREAM OR POWDERED CREAMER of any kind), and Gatorade.  Patient Instructions  The night before surgery:  No food after midnight. ONLY clear liquids after midnight  The day of surgery (if you have diabetes): Drink ONE (1) 12 oz G2 given to you in your pre admission testing appointment by 4:30 AM the morning of surgery. Drink in one sitting. Do not sip.  This drink was given to you during your hospital  pre-op appointment visit.  Nothing else to drink after completing the  12 oz bottle of G2.         If you have questions, please contact your surgeon's office.   Take these medicines the morning of surgery with A SIP OF WATER: allopurinol  (ZYLOPRIM )  carvedilol  (COREG )  DULoxetine  (CYMBALTA )  fluticasone -salmeterol (ADVAIR HFA) inhaler isosorbide -hydrALAZINE  (BIDIL )  Tiotropium Bromide  Monohydrate (SPIRIVA  RESPIMAT)    May take these medicines IF NEEDED: albuterol  (PROVENTIL ) nebulizer solution  albuterol  (VENTOLIN  HFA) inhaler - please bring inhaler with you morning of surgery colchicine   oxyCODONE   tiZANidine  (ZANAFLEX )    Per Dr. Damian instructions, Hold Eliquis  3 days prior to surgery.   Take your last dose on Friday August 29.   One week prior to surgery, STOP taking any Aspirin  (unless otherwise instructed by your surgeon) Aleve , Naproxen , Ibuprofen , Motrin , Advil , Goody's, BC's, all herbal medications, fish oil, and non-prescription vitamins.   WHAT DO I DO ABOUT MY DIABETES MEDICATION?   STOP taking your dapagliflozin  propanediol (FARXIGA ) three days prior to surgery. Your last dose will be August 29th.   HOW TO MANAGE YOUR DIABETES BEFORE AND AFTER SURGERY  Why is it important to control my blood sugar before and after surgery? Improving blood sugar levels before and after surgery helps healing and can limit problems. A way of improving blood sugar control is eating a healthy diet by:  Eating less sugar and carbohydrates  Increasing activity/exercise  Talking with your doctor about reaching your blood sugar goals High blood sugars (greater than 180 mg/dL) can raise your risk of infections and slow your recovery, so you will need to focus on controlling your diabetes during the weeks before surgery. Make sure that the doctor who takes care of your diabetes knows about your planned surgery including the date and location.  How do I manage my blood sugar before surgery? Check your blood sugar at least 4 times a day, starting 2 days before surgery, to make sure that the level is not too high or low.  Check your blood sugar the morning of your surgery when you wake up and every 2 hours until you get to the Short Stay unit.  If your blood sugar is less than 70 mg/dL, you will need to  treat for low blood sugar: Do not take insulin . Treat a low blood sugar (less than 70 mg/dL) with  cup of clear juice (cranberry or apple), 4 glucose tablets, OR glucose gel. Recheck blood sugar in 15 minutes after treatment (to make sure it is greater than 70 mg/dL). If your blood sugar is not greater than 70 mg/dL on recheck, call [phone] for further instructions. Report your  blood sugar to the short stay nurse when you get to Short Stay.  If you are admitted to the hospital after surgery: Your blood sugar will be checked by the staff and you will probably be given insulin  after surgery (instead of oral diabetes medicines) to make sure you have good blood sugar levels. The goal for blood sugar control after surgery is 80-180 mg/dL.                      Do NOT Smoke (Tobacco/Vaping) for 24 hours prior to your procedure.  If you use a CPAP at night, you may bring your mask/headgear for your overnight stay.   You will be asked to remove any contacts, glasses, piercing's, hearing aid's, dentures/partials prior to surgery. Please bring cases for these items if needed.    Patients discharged the day of surgery will not be allowed to drive home, and someone needs to stay with them for 24 hours.  SURGICAL WAITING ROOM VISITATION Patients may have no more than 2 support people in the waiting area - these visitors may rotate.   Pre-op nurse will coordinate an appropriate time for 1 ADULT support person, who may not rotate, to accompany patient in pre-op.  Children under the age of 1 must have an adult with them who is not the patient and must remain in the main waiting area with an adult.  If the patient needs to stay at the hospital during part of their recovery, the visitor guidelines for inpatient rooms apply.  Please refer to the [facility] website for the visitor guidelines for any additional information.   If you received a COVID test during your pre-op visit  it is requested that you wear a mask when out in public, stay away from anyone that may not be feeling well and notify your surgeon if you develop symptoms. If you have been in contact with anyone that has tested positive in the last 10 days please notify you surgeon.      Pre-operative 5 CHG Bathing Instructions   You can play a key role in reducing the risk of infection after surgery. Your skin  needs to be as free of germs as possible. You can reduce the number of germs on your skin by washing with CHG (chlorhexidine  gluconate) soap before surgery. CHG is an antiseptic soap that kills germs and continues to kill germs even after washing.   DO NOT use if you have an allergy to chlorhexidine /CHG or antibacterial soaps. If your skin becomes reddened or irritated, stop using the CHG and notify one of our RNs at [phone].   Please shower with the CHG soap starting 4 days before surgery using the following schedule:     Please keep in mind the following:  DO NOT shave, including legs and underarms, starting the day of your first shower.   You may shave your face at any point before/day of surgery.  Place clean sheets on your bed the day you start using CHG soap. Use a clean washcloth (not used since being washed) for  each shower. DO NOT sleep with pets once you start using the CHG.   CHG Shower Instructions:  Wash your face and private area with normal soap. If you choose to wash your hair, wash first with your normal shampoo.  After you use shampoo/soap, rinse your hair and body thoroughly to remove shampoo/soap residue.  Turn the water OFF and apply about 3 tablespoons (45 ml) of CHG soap to a CLEAN washcloth.  Apply CHG soap ONLY FROM YOUR NECK DOWN TO YOUR TOES (washing for 3-5 minutes)  DO NOT use CHG soap on face, private areas, open wounds, or sores.  Pay special attention to the area where your surgery is being performed.  If you are having back surgery, having someone wash your back for you may be helpful. Wait 2 minutes after CHG soap is applied, then you may rinse off the CHG soap.  Pat dry with a clean towel  Put on clean clothes/pajamas   If you choose to wear lotion, please use ONLY the CHG-compatible lotions that are listed below.  Additional instructions for the day of surgery: DO NOT APPLY any lotions, deodorants, cologne, or perfumes.   Do not bring valuables  to the hospital. [facility] is not responsible for any belongings/valuables. Do not wear nail polish, gel polish, artificial nails, or any other type of covering on natural nails (fingers and toes) Do not wear jewelry or makeup Put on clean/comfortable clothes.  Please brush your teeth.  Ask your nurse before applying any prescription medications to the skin.     CHG Compatible Lotions   Aveeno Moisturizing lotion  Cetaphil Moisturizing Cream  Cetaphil Moisturizing Lotion  Clairol Herbal Essence Moisturizing Lotion, Dry Skin  Clairol Herbal Essence Moisturizing Lotion, Extra Dry Skin  Clairol Herbal Essence Moisturizing Lotion, Normal Skin  Curel Age Defying Therapeutic Moisturizing Lotion with Alpha Hydroxy  Curel Extreme Care Body Lotion  Curel Soothing Hands Moisturizing Hand Lotion  Curel Therapeutic Moisturizing Cream, Fragrance-Free  Curel Therapeutic Moisturizing Lotion, Fragrance-Free  Curel Therapeutic Moisturizing Lotion, Original Formula  Eucerin Daily Replenishing Lotion  Eucerin Dry Skin Therapy Plus Alpha Hydroxy Crme  Eucerin Dry Skin Therapy Plus Alpha Hydroxy Lotion  Eucerin Original Crme  Eucerin Original Lotion  Eucerin Plus Crme Eucerin Plus Lotion  Eucerin TriLipid Replenishing Lotion  Keri Anti-Bacterial Hand Lotion  Keri Deep Conditioning Original Lotion Dry Skin Formula Softly Scented  Keri Deep Conditioning Original Lotion, Fragrance Free Sensitive Skin Formula  Keri Lotion Fast Absorbing Fragrance Free Sensitive Skin Formula  Keri Lotion Fast Absorbing Softly Scented Dry Skin Formula  Keri Original Lotion  Keri Skin Renewal Lotion Keri Silky Smooth Lotion  Keri Silky Smooth Sensitive Skin Lotion  Nivea Body Creamy Conditioning Oil  Nivea Body Extra Enriched Lotion  Nivea Body Original Lotion  Nivea Body Sheer Moisturizing Lotion Nivea Crme  Nivea Skin Firming Lotion  NutraDerm 30 Skin Lotion  NutraDerm Skin Lotion  NutraDerm Therapeutic  Skin Cream  NutraDerm Therapeutic Skin Lotion  ProShield Protective Hand Cream  Provon moisturizing lotion  Please read over the following fact sheets that you were given.

## 2024-02-08 NOTE — Progress Notes (Signed)
 PCP - Barnie Louder, MD Cardiologist - Ezra Rolan COME  PPM/ICD - denies Device Orders -  Rep Notified -   Chest x-ray - 01/16/24 EKG - 01/16/24 Stress Test - 07/28/21 ECHO - 11/02/22 Cardiac Cath - 11/19/22  Sleep Study - denies CPAP -   Fasting Blood Sugar - pt does not check his sugar at home and does not have a meter.  Checks Blood Sugar _____ times a day  Last dose of GLP1 agonist-   GLP1 instructions:   Blood Thinner Instructions:hold Eliquis  3 days prior to surgery. Last dose will be 02/10/24. Aspirin  Instructions:na  ERAS Protcol -clear liquids until 0430 PRE-SURGERY Ensure or G2- G2  COVID TEST- na   Anesthesia review: yes -CKD-Cr-2.27; hx CHF,CAD,HTN,no cardiac clearance ; LOV with cardiology 07/26/23  Patient denies shortness of breath, fever, cough and chest pain at PAT appointment   All instructions explained to the patient, with a verbal understanding of the material. Patient agrees to go over the instructions while at home for a better understanding.  The opportunity to ask questions was provided.

## 2024-02-09 ENCOUNTER — Telehealth: Payer: Self-pay

## 2024-02-09 LAB — HEMOGLOBIN A1C
Hgb A1c MFr Bld: 6.4 % — ABNORMAL HIGH (ref 4.8–5.6)
Mean Plasma Glucose: 137 mg/dL

## 2024-02-09 NOTE — Telephone Encounter (Signed)
   Name: Chris [name]  DOB: May 09, 1961  MRN: [id number]  Primary Cardiologist: Redell Cave, MD   Preoperative team, please contact this patient and set up a phone call appointment for further preoperative risk assessment. Please obtain consent and complete medication review. Thank you for your help.  I confirm that guidance regarding antiplatelet and oral anticoagulation therapy has been completed and, if necessary, noted below.  Eliquis  hold results pending pharmacy review  I also confirmed the patient resides in the state of [place] . As per [facility] Medical Board telemedicine laws, the patient must reside in the state in which the provider is licensed.   Wyn Raddle, Jackee Shove, NP 02/09/2024, 2:43 PM [facility]

## 2024-02-09 NOTE — Telephone Encounter (Signed)
Patient has been scheduled for televisit.

## 2024-02-09 NOTE — Telephone Encounter (Signed)
 Patient with diagnosis of atrial fibrillation on Eliquis  for anticoagulation.    Procedure:  RIGHT TOTAL HIP ARTHROPLASTY   Date of Surgery:  Clearance 02/14/24    (PER REQUEST URGENT)            CHA2DS2-VASc Score = 4   This indicates a 4.8% annual risk of stroke. The patient's score is based upon: CHF History: 1 HTN History: 1 Diabetes History: 1 Stroke History: 0 Vascular Disease History: 1 Age Score: 0 Gender Score: 0    CrCl 60 Platelet count 177  Patient has not had an Afib/aflutter ablation within the last 3 months or DCCV within the last 30 days  Per office protocol, patient can hold Eliquis  for 3 days prior to procedure.   Patient will not need bridging with Lovenox  (enoxaparin ) around procedure.  **This guidance is not considered finalized until pre-operative APP has relayed final recommendations.**

## 2024-02-09 NOTE — Telephone Encounter (Signed)
   Pre-operative Risk Assessment    Patient Name: HORACE LUKAS  DOB: 1960/07/31 MRN: [id number]   Date of last office visit: 11/08/23 EZRA SHUCK, MD ([facility]), 07/26/23 DAPHNE BARRACK, NP Date of next office visit: 03/12/24 REDELL CAVE, MD   Request for Surgical Clearance    Procedure:  RIGHT TOTAL HIP ARTHROPLASTY  Date of Surgery:  Clearance 02/14/24    (PER REQUEST URGENT)                            Surgeon:  LONNI POLI, MD Surgeon's Group or Practice Name:  [facility] Phone number:  [phone] Fax number:  [phone]   Type of Clearance Requested:   - Medical  - Pharmacy:  Hold Apixaban  (Eliquis )     Type of Anesthesia:  Spinal   Additional requests/questions:    SignedLucie DELENA Ku   02/09/2024, 2:31 PM

## 2024-02-09 NOTE — Telephone Encounter (Signed)
 Pharmacy please advise on holding Eliquis  prior to right total hip arthroplasty scheduled for 02/14/2024. Thank you.

## 2024-02-09 NOTE — Telephone Encounter (Signed)
 Patient has been schedule for televisit med rec and consent done    Patient Consent for Virtual Visit         Chris [name] has provided verbal consent on 02/09/2024 for a virtual visit (video or telephone).   CONSENT FOR VIRTUAL VISIT FOR:  Chris [name]  By participating in this virtual visit I agree to the following:  I hereby voluntarily request, consent and authorize [facility] and its employed or contracted physicians, physician assistants, nurse practitioners or other licensed health care professionals (the Practitioner), to provide me with telemedicine health care services (the "Services) as deemed necessary by the treating Practitioner. I acknowledge and consent to receive the Services by the Practitioner via telemedicine. I understand that the telemedicine visit will involve communicating with the Practitioner through live audiovisual communication technology and the disclosure of certain medical information by electronic transmission. I acknowledge that I have been given the opportunity to request an in-person assessment or other available alternative prior to the telemedicine visit and am voluntarily participating in the telemedicine visit.  I understand that I have the right to withhold or withdraw my consent to the use of telemedicine in the course of my care at any time, without affecting my right to future care or treatment, and that the Practitioner or I may terminate the telemedicine visit at any time. I understand that I have the right to inspect all information obtained and/or recorded in the course of the telemedicine visit and may receive copies of available information for a reasonable fee.  I understand that some of the potential risks of receiving the Services via telemedicine include:  Delay or interruption in medical evaluation due to technological equipment failure or disruption; Information transmitted may not be sufficient (e.g. poor resolution of  images) to allow for appropriate medical decision making by the Practitioner; and/or  In rare instances, security protocols could fail, causing a breach of personal health information.  Furthermore, I acknowledge that it is my responsibility to provide information about my medical history, conditions and care that is complete and accurate to the best of my ability. I acknowledge that Practitioner's advice, recommendations, and/or decision may be based on factors not within their control, such as incomplete or inaccurate data provided by me or distortions of diagnostic images or specimens that may result from electronic transmissions. I understand that the practice of medicine is not an exact science and that Practitioner makes no warranties or guarantees regarding treatment outcomes. I acknowledge that a copy of this consent can be made available to me via my patient portal [facility] MyChart), or I can request a printed copy by calling the office of [facility].    I understand that my insurance will be billed for this visit.   I have read or had this consent read to me. I understand the contents of this consent, which adequately explains the benefits and risks of the Services being provided via telemedicine.  I have been provided ample opportunity to ask questions regarding this consent and the Services and have had my questions answered to my satisfaction. I give my informed consent for the services to be provided through the use of telemedicine in my medical care

## 2024-02-10 ENCOUNTER — Ambulatory Visit: Attending: Internal Medicine

## 2024-02-10 DIAGNOSIS — Z01810 Encounter for preprocedural cardiovascular examination: Secondary | ICD-10-CM | POA: Insufficient documentation

## 2024-02-10 NOTE — Progress Notes (Signed)
 Virtual Visit via Telephone Note   Because of KABEER HOAGLAND co-morbid illnesses, he is at least at moderate risk for complications without adequate follow up.  This format is felt to be most appropriate for this patient at this time.  Due to technical limitations with video connection Web designer), today's appointment will be conducted as an audio only telehealth visit, and KAMDIN FOLLETT verbally agreed to proceed in this manner.   All issues noted in this document were discussed and addressed.  No physical exam could be performed with this format.  Evaluation Performed:  Preoperative cardiovascular risk assessment _____________   Date:  02/10/2024   Patient ID:  CHRISTIPHER RIEGER, DOB [birth year], MRN [id number] Patient Location:  Home Provider location:   Office  Primary Care Provider:  Louder Barnie NOVAK, MD Primary Cardiologist:  Redell Cave, MD  Chief Complaint / Patient Profile   63 y.o. y/o male with a h/o HFpEF, hypertrophic CM, renal cell CA s/p ablation, atrial flutter, CKD stage III, former tobacco abuse who is pending right total hip arthroplasty and presents today for telephonic preoperative cardiovascular risk assessment.  History of Present Illness    MALAQUIAS LENKER is a 63 y.o. male who presents via audio/video conferencing for a telehealth visit today.  Pt was last seen in cardiology clinic on 11/08/2023 by Dr. Rolan.  At that time DMITRI PETTIGREW was doing well with no chest pain but endorses shortness of breath with walking around 100 yards and BP was elevated. The patient is now pending procedure as outlined above. Since his last visit, he has been doing well with no new cardiac complaints. He reports his blood pressures are ranging in the 140s systolically and have improved slightly since his follow-up visit in May.  He denies chest pain, shortness of breath, lower extremity edema, fatigue, palpitations, melena, hematuria, hemoptysis, diaphoresis, weakness,  presyncope, syncope, orthopnea, and PND.   Per office protocol, patient can hold Eliquis  for 3 days prior to procedure.   Patient will not need bridging with Lovenox  (enoxaparin ) around procedure.  Past Medical History    Past Medical History:  Diagnosis Date   Arrhythmia    atrial flutter   CHF (congestive heart failure) (HCC)    Chronic kidney disease    COPD (chronic obstructive pulmonary disease) (HCC)    Coronary artery disease    Depression    Diabetes mellitus without complication (HCC)    GERD (gastroesophageal reflux disease)    Gout    Hypertension    Influenza A with respiratory manifestations    Mental disorder    Past Surgical History:  Procedure Laterality Date   ANKLE SURGERY Left    CARDIAC CATHETERIZATION     CARDIOVERSION N/A 07/08/2020   Procedure: CARDIOVERSION;  Surgeon: Hester Wolm PARAS, MD;  Location: [facility];  Service: Cardiovascular;  Laterality: N/A;   COLONOSCOPY WITH PROPOFOL  N/A 11/19/2021   Procedure: COLONOSCOPY WITH PROPOFOL ;  Surgeon: Stacia Glendia BRAVO, MD;  Location: [facility];  Service: Gastroenterology;  Laterality: N/A;   HERNIA REPAIR     x2   IR RADIOLOGIST EVAL & MGMT  05/13/2022   IR RADIOLOGIST EVAL & MGMT  10/27/2022   IR RADIOLOGIST EVAL & MGMT  03/02/2023   IR RADIOLOGIST EVAL & MGMT  10/31/2023   POLYPECTOMY  11/19/2021   Procedure: POLYPECTOMY;  Surgeon: Stacia Glendia BRAVO, MD;  Location: [facility];  Service: Gastroenterology;;   RADIOLOGY WITH ANESTHESIA N/A 06/23/2022   Procedure: Right renal tumor  ablation;  Surgeon: Jennefer Ester PARAS, MD;  Location: [facility];  Service: Radiology;  Laterality: N/A;   RIGHT HEART CATH N/A 07/13/2022   Procedure: RIGHT HEART CATH;  Surgeon: Rolan Ezra RAMAN, MD;  Location: [facility] INVASIVE CV LAB;  Service: Cardiovascular;  Laterality: N/A;   RIGHT/LEFT HEART CATH AND CORONARY ANGIOGRAPHY N/A 11/19/2022   Procedure: RIGHT/LEFT HEART CATH AND CORONARY ANGIOGRAPHY;  Surgeon: Rolan Ezra RAMAN,  MD;  Location: [facility] INVASIVE CV LAB;  Service: Cardiovascular;  Laterality: N/A;   SHOULDER SURGERY     TEE WITHOUT CARDIOVERSION N/A 07/08/2020   Procedure: TRANSESOPHAGEAL ECHOCARDIOGRAM (TEE);  Surgeon: Hester Wolm PARAS, MD;  Location: [facility];  Service: Cardiovascular;  Laterality: N/A;    Allergies  No Known Allergies  Home Medications    Prior to Admission medications   Medication Sig Start Date End Date Taking? Authorizing Provider  acetaminophen  (TYLENOL ) 500 MG tablet Take 1-2 tablets (500-1,000 mg total) by mouth every 6 (six) hours as needed (pain.). Patient not taking: Reported on 02/03/2024 12/09/22   Garrick Charleston, MD  albuterol  (PROVENTIL ) (2.5 MG/3ML) 0.083% nebulizer solution Take 3 mLs (2.5 mg total) by nebulization every 4 (four) hours as needed for wheezing or shortness of breath. Patient not taking: Reported on 02/03/2024 01/05/23   Danton Jon HERO, PA-C  albuterol  (PROVENTIL ) (2.5 MG/3ML) 0.083% nebulizer solution Take 3 mLs (2.5 mg total) by nebulization every 4 (four) hours as needed for wheezing or shortness of breath. 08/18/23   Cottie Donnice PARAS, MD  albuterol  (VENTOLIN  HFA) 108 (90 Base) MCG/ACT inhaler Inhale 2 puffs into the lungs every 6 (six) hours as needed for wheezing or shortness of breath. 07/26/23   Vicci Barnie NOVAK, MD  allopurinol  (ZYLOPRIM ) 100 MG tablet TAKE 2 TABLETS (200 MG TOTAL) BY MOUTH DAILY. 07/26/23   Vicci Barnie NOVAK, MD  atorvastatin  (LIPITOR) 80 MG tablet Take 1 tablet (80 mg total) by mouth daily. Patient taking differently: Take 80 mg by mouth at bedtime. 07/26/23 02/03/24  Vicci Barnie NOVAK, MD  benzonatate  (TESSALON ) 100 MG capsule Take 1 capsule (100 mg total) by mouth 2 (two) times daily as needed for cough. Patient not taking: Reported on 02/03/2024 02/02/24   Newlin, Enobong, MD  carvedilol  (COREG ) 12.5 MG tablet Take 1 tablet (12.5 mg total) by mouth 2 (two) times daily. Patient not taking: Reported on 02/03/2024 07/26/23   Vicci Barnie NOVAK, MD  carvedilol  (COREG ) 12.5 MG tablet Take 1.5 tablets (18.75 mg total) by mouth 2 (two) times daily with a meal. 11/08/23   Rolan Ezra RAMAN, MD  colchicine  0.6 MG tablet Take 0.5 tablets (0.3 mg total) by mouth three times a week. Patient taking differently: Take 0.6 mg by mouth daily as needed (gout flare). 08/18/23   Vicci Barnie NOVAK, MD  dapagliflozin  propanediol (FARXIGA ) 10 MG TABS tablet Take 1 tablet (10 mg total) by mouth daily. 07/26/23   Vicci Barnie NOVAK, MD  DULoxetine  (CYMBALTA ) 60 MG capsule Take 1 capsule (60 mg total) by mouth daily. For pain 02/02/24   Delbert Clam, MD  ELIQUIS  5 MG TABS tablet TAKE 1 TABLET BY MOUTH 2 (TWO) TIMES DAILY (AM+PM) 11/21/23   Rolan Ezra RAMAN, MD  fluticasone -salmeterol (ADVAIR HFA) 230-21 MCG/ACT inhaler Inhale 2 puffs into the lungs 2 (two) times daily. 07/26/23   Vicci Barnie NOVAK, MD  isosorbide -hydrALAZINE  (BIDIL ) 20-37.5 MG tablet Take 1 tablet by mouth 3 (three) times daily. 07/26/23   Vicci Barnie NOVAK, MD  Menthol -Methyl Salicylate (MUSCLE RUB) 10-15 %  CREA Apply 1 Application topically as needed. 01/25/24   Cyrena Mylar, MD  mirtazapine  (REMERON ) 15 MG tablet Take 0.5 tablets (7.5 mg total) by mouth at bedtime. Patient not taking: Reported on 02/03/2024 09/06/23 12/05/23  Lynnette Barter, MD  oxyCODONE  10 MG TABS Take 1 tablet (10 mg total) by mouth every 6 (six) hours as needed for up to 16 doses for severe pain (pain score 7-10). 01/25/24   Cyrena Mylar, MD  potassium chloride  (KLOR-CON ) 10 MEQ tablet Take 2 tablets (20 mEq total) by mouth daily. 07/26/23   Vicci Barnie NOVAK, MD  Tiotropium Bromide  Monohydrate (SPIRIVA  RESPIMAT) 2.5 MCG/ACT AERS Inhale 2 puffs into the lungs daily. 07/26/23   Vicci Barnie NOVAK, MD  tiZANidine  (ZANAFLEX ) 4 MG tablet Take 1 tablet (4 mg total) by mouth every 6 (six) hours as needed for muscle spasms. 02/01/24   Vernetta Lonni GRADE, MD  torsemide  (DEMADEX ) 20 MG tablet Take 20-40 mg by mouth See admin  instructions. Take one tablet (20 mg) by mouth on Monday, Wednesday, Friday, Sunday. Take two tablets (40 mg) by mouth on Tuesday, Thursday and Saturday.    [provider]  metoprolol  tartrate (LOPRESSOR ) 100 MG tablet Take 1 tablet (100 mg total) by mouth 2 (two) times daily. 07/09/20 07/11/20  Laurita Pillion, MD    Physical Exam    Vital Signs:  PARRY PO does not have vital signs available for review today.140/77  Given telephonic nature of communication, physical exam is limited. AAOx3. NAD. Normal affect.  Speech and respirations are unlabored.  Accessory Clinical Findings    None  Assessment & Plan    1.  Preoperative Cardiovascular Risk Assessment: - Patient's RCRI score is 6.6%  The patient affirms he has been doing well without any new cardiac symptoms. They are able to achieve 7 METS without cardiac limitations. Therefore, based on ACC/AHA guidelines, the patient would be at acceptable risk for the planned procedure without further cardiovascular testing. The patient was advised that if he develops new symptoms prior to surgery to contact our office to arrange for a follow-up visit, and he verbalized understanding.   The patient was advised that if he develops new symptoms prior to surgery to contact our office to arrange for a follow-up visit, and he verbalized understanding.  Per office protocol, patient can hold Eliquis  for 3 days prior to procedure.   Patient will not need bridging with Lovenox  (enoxaparin ) around procedure.  A copy of this note will be routed to requesting surgeon.  Time:   Today, I have spent 6 minutes with the patient with telehealth technology discussing medical history, symptoms, and management plan.     Wyn Raddle, Jackee Shove, NP  02/10/2024, 7:21 AM

## 2024-02-10 NOTE — Anesthesia Preprocedure Evaluation (Addendum)
 Anesthesia Evaluation  Patient identified by MRN, date of birth, ID band Patient awake    Reviewed: Allergy & Precautions, NPO status , Patient's Chart, lab work & pertinent test results, reviewed documented beta blocker date and time   History of Anesthesia Complications Negative for: history of anesthetic complications  Airway Mallampati: II  TM Distance: >3 FB Neck ROM: Full    Dental  (+) Edentulous Upper, Missing,    Pulmonary COPD,  COPD inhaler, former smoker   Pulmonary exam normal        Cardiovascular hypertension, Pt. on home beta blockers and Pt. on medications + CAD and +CHF  Normal cardiovascular exam+ dysrhythmias (on Eliquis ) Atrial Fibrillation   TTE 11/02/22: EF 45-50%, mild LVE, moderate LVH, grade I DD, mild LAE, mild to moderate MR, borderline dilatation of the aortic root measuring 39mm     Neuro/Psych   Anxiety Depression       GI/Hepatic ,GERD  ,,(+)     substance abuse (remote cocaine abuse)  marijuana use  Endo/Other  diabetes, Type 2, Oral Hypoglycemic Agents    Renal/GU Renal InsufficiencyRenal disease (Cr 2.27)  negative genitourinary   Musculoskeletal  (+) Arthritis ,    Abdominal   Peds  Hematology negative hematology ROS (+)   Anesthesia Other Findings gout  Reproductive/Obstetrics negative OB ROS                              Anesthesia Physical Anesthesia Plan  ASA: 3  Anesthesia Plan: Spinal   Post-op Pain Management: Tylenol  PO (pre-op)*   Induction:   PONV Risk Score and Plan: 2 and Treatment may vary due to age or medical condition, Ondansetron , Propofol  infusion, Dexamethasone  and Midazolam   Airway Management Planned: Natural Airway and Simple Face Mask  Additional Equipment: None  Intra-op Plan:   Post-operative Plan:   Informed Consent: I have reviewed the patients History and Physical, chart, labs and discussed the procedure  including the risks, benefits and alternatives for the proposed anesthesia with the patient or authorized representative who has indicated his/her understanding and acceptance.       Plan Discussed with: CRNA  Anesthesia Plan Comments: (PAT note by Lynwood Hope, PA-C:  63 year old male follows with cardiology for history of HFpEF, HTN, systolic heart failure with recovered EF, possible hypertrophic cardiomyopathy, atrial flutter s/p DCCV. Cardiolite in 2/23 showed no ischemia, fixed inferior defect. Echo in 8/23 showed EF 50% with severe asymmetric septal hypertrophy but no SAM or LVOT gradient. Cardiac MRI in 12/23 was similar with LV EF 50%, severe asymmetric septal hypertrophy, normal RV, LGE in the basal septum. These studies were concerning for hypertrophic cardiomyopathy. RHC in 1/24 showed elevated filling pressures, CI 2.25, PVR 3.9 WU. Torsemide  was increased. Zio monitor done 2/24 for episodes of ?syncope showed no AF/AFL, 1.5 % PVCs, 6 short NSVT runs (longest was 7 beats). Echo in 5/24 showed EF 45-50%, moderate asymmetric septal hypertrophy with no LVOT gradient of mitral valve SAM, RV normal, mild-moderate MR, IVC normal. LHC/RHC was done in 6/24, showing 65% proximal RCA stenosis, normal filling pressures but low CI at 1.81.  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  Last seen in follow-up in advanced heart failure clinic by Dr. Rolan on 11/08/2023.  Noted to be NYHA class II-III at that time, stable (felt partially due to deconditioning), euvolemic.  He was continued on BiDil  1 tab 3 times daily, Coreg  was increased to 18.75 mg twice daily,  torsemide  continued 20 mg daily alternating with 40 mg daily, dapagliflozin  continued 10 mg daily.  He was recommended to have a repeat echo at follow-up in July 2025, however, patient did not follow through with this.  He was also suspected to have OSA and sleep study was ordered but never completed.  He was seen by Jackee Alberts, NP on 02/10/2024 via  televisit for preop evaluation.  Per note, Preoperative Cardiovascular Risk Assessment: - Patient's RCRI score is 6.6%. The patient affirms he has been doing well without any new cardiac symptoms. They are able to achieve 7 METS without cardiac limitations. Therefore, based on ACC/AHA guidelines, the patient would be at acceptable risk for the planned procedure without further cardiovascular testing. The patient was advised that if he develops new symptoms prior to surgery to contact our office to arrange for a follow-up visit, and he verbalized understanding. The patient was advised that if he develops new symptoms prior to surgery to contact our office to arrange for a follow-up visit, and he verbalized understanding. Per office protocol, patient can hold Eliquis  for 3 days prior to procedure.  Patient will not need bridging with Lovenox  (enoxaparin ) around procedure.  Other pertinent history includes prior cocaine abuse, non-insulin -dependent DM2, CKD 3, former smoker with associated COPD.  Patient reported last dose of Eliquis  02/10/2024.  Preop labs reviewed, creatinine elevated to 2.27 consistent with history of CKD, otherwise unremarkable.  DM2 well-controlled with A1c 6.4.  EKG 01/16/2024: Normal sinus rhythm.  Rate 76. Incomplete right bundle branch block. Moderate voltage criteria for LVH, may be normal variant ( R in aVL , Cornell product ). T wave abnormality, consider lateral ischemia. Prolonged QT (QTcB 470)  Right/left cath 11/19/2022:   Prox RCA lesion is 65% stenosed.   1. Normal filling pressures.  2. Low cardiac output.  3. 65% proximal RCA stenosis, medically managed.     Coronary disease is nonobstructive.   TTE 11/02/2022: 1. Left ventricular ejection fraction, by estimation, is 45 to 50%. The  left ventricle has mildly decreased function. Challenging images, unable  to exclude regional wall motion abnormalities (select images suggestive of  inferior and lateral wall   hypokinesis). The left ventricular internal cavity size was mildly  dilated. There is moderate left ventricular septal hypertrophy. Left  ventricular diastolic parameters are consistent with Grade I diastolic  dysfunction (impaired relaxation).   2. Right ventricular systolic function is normal. The right ventricular  size is normal.   3. Left atrial size was mildly dilated.   4. The mitral valve is normal in structure. Mild to moderate mitral valve  regurgitation. No evidence of mitral stenosis.   5. The aortic valve has an indeterminant number of cusps. Aortic valve  regurgitation is not visualized. No aortic stenosis is present.   6. There is borderline dilatation of the aortic root, measuring 39 mm.   7. The inferior vena cava is normal in size with greater than 50%  respiratory variability, suggesting right atrial pressure of 3 mmHg.   8. rhythm is normal sinus   )         Anesthesia Quick Evaluation

## 2024-02-10 NOTE — Progress Notes (Signed)
 Anesthesia Chart Review:  63 year old male follows with cardiology for history of HFpEF, HTN, systolic heart failure with recovered EF, possible hypertrophic cardiomyopathy, atrial flutter s/p DCCV. Cardiolite in 2/23 showed no ischemia, fixed inferior defect. Echo in 8/23 showed EF 50% with severe asymmetric septal hypertrophy but no SAM or LVOT gradient. Cardiac MRI in 12/23 was similar with LV EF 50%, severe asymmetric septal hypertrophy, normal RV, LGE in the basal septum. These studies were concerning for hypertrophic cardiomyopathy. RHC in 1/24 showed elevated filling pressures, CI 2.25, PVR 3.9 WU. Torsemide  was increased. Zio monitor done 2/24 for episodes of ?syncope showed no AF/AFL, 1.5 % PVCs, 6 short NSVT runs (longest was 7 beats). Echo in 5/24 showed EF 45-50%, moderate asymmetric septal hypertrophy with no LVOT gradient of mitral valve SAM, RV normal, mild-moderate MR, IVC normal. LHC/RHC was done in 6/24, showing 65% proximal RCA stenosis, normal filling pressures but low CI at 1.81.  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  Last seen in follow-up in advanced heart failure clinic by Dr. Rolan on 11/08/2023.  Noted to be NYHA class II-III at that time, stable (felt partially due to deconditioning), euvolemic.  He was continued on BiDil  1 tab 3 times daily, Coreg  was increased to 18.75 mg twice daily, torsemide  continued 20 mg daily alternating with 40 mg daily, dapagliflozin  continued 10 mg daily.  He was recommended to have a repeat echo at follow-up in July 2025, however, patient did not follow through with this.  He was also suspected to have OSA and sleep study was ordered but never completed.  He was seen by Jackee Alberts, NP on 02/10/2024 via televisit for preop evaluation.  Per note, Preoperative Cardiovascular Risk Assessment: - Patient's RCRI score is 6.6%. The patient affirms he has been doing well without any new cardiac symptoms. They are able to achieve 7 METS without cardiac  limitations. Therefore, based on ACC/AHA guidelines, the patient would be at acceptable risk for the planned procedure without further cardiovascular testing. The patient was advised that if he develops new symptoms prior to surgery to contact our office to arrange for a follow-up visit, and he verbalized understanding. The patient was advised that if he develops new symptoms prior to surgery to contact our office to arrange for a follow-up visit, and he verbalized understanding. Per office protocol, patient can hold Eliquis  for 3 days prior to procedure.  Patient will not need bridging with Lovenox  (enoxaparin ) around procedure.  Other pertinent history includes prior cocaine abuse, non-insulin -dependent DM2, CKD 3, former smoker with associated COPD.  Patient reported last dose of Eliquis  02/10/2024.  Preop labs reviewed, creatinine elevated to 2.27 consistent with history of CKD, otherwise unremarkable.  DM2 well-controlled with A1c 6.4.  EKG 01/16/2024: Normal sinus rhythm.  Rate 76. Incomplete right bundle branch block. Moderate voltage criteria for LVH, may be normal variant ( R in aVL , Cornell product ). T wave abnormality, consider lateral ischemia. Prolonged QT (QTcB 470)  Right/left cath 11/19/2022:   Prox RCA lesion is 65% stenosed.   1. Normal filling pressures.  2. Low cardiac output.  3. 65% proximal RCA stenosis, medically managed.     Coronary disease is nonobstructive.   TTE 11/02/2022: 1. Left ventricular ejection fraction, by estimation, is 45 to 50%. The  left ventricle has mildly decreased function. Challenging images, unable  to exclude regional wall motion abnormalities (select images suggestive of  inferior and lateral wall  hypokinesis). The left ventricular internal cavity size was mildly  dilated.  There is moderate left ventricular septal hypertrophy. Left  ventricular diastolic parameters are consistent with Grade I diastolic  dysfunction (impaired relaxation).    2. Right ventricular systolic function is normal. The right ventricular  size is normal.   3. Left atrial size was mildly dilated.   4. The mitral valve is normal in structure. Mild to moderate mitral valve  regurgitation. No evidence of mitral stenosis.   5. The aortic valve has an indeterminant number of cusps. Aortic valve  regurgitation is not visualized. No aortic stenosis is present.   6. There is borderline dilatation of the aortic root, measuring 39 mm.   7. The inferior vena cava is normal in size with greater than 50%  respiratory variability, suggesting right atrial pressure of 3 mmHg.   8. rhythm is normal sinus      Lynwood Geofm [facility]/Anesthesiology Phone [phone] 02/10/2024 3:52 PM

## 2024-02-13 NOTE — H&P (Signed)
 TOTAL HIP ADMISSION H&P  Patient is admitted for right total hip arthroplasty.  Subjective:  Chief Complaint: right hip pain  HPI: Chris [name], 63 y.o. male, has a history of pain and functional disability in the right hip(s) due to arthritis and patient has failed non-surgical conservative treatments for greater than 12 weeks to include corticosteriod injections, use of assistive devices, weight reduction as appropriate, and activity modification.  Onset of symptoms was gradual starting 1 years ago with rapidlly worsening course since that time.The patient noted no past surgery on the right hip(s).  Patient currently rates pain in the right hip at 10 out of 10 with activity. Patient has night pain, worsening of pain with activity and weight bearing, trendelenberg gait, pain that interfers with activities of daily living, and pain with passive range of motion. Patient has evidence of subchondral sclerosis, periarticular osteophytes, and joint space narrowing by imaging studies. This condition presents safety issues increasing the risk of falls.  There is no current active infection.  Patient Active Problem List   Diagnosis Date Noted   Chills 07/29/2023   Unilateral primary osteoarthritis, right hip 07/29/2023   Pain in left shoulder 07/27/2023   Post traumatic stress disorder 09/23/2022   MDD (major depressive disorder), recurrent episode, moderate (HCC) 08/26/2022   Stage 3b chronic kidney disease (HCC) 08/09/2022   Hypertrophic cardiomyopathy (HCC) 08/09/2022   History of renal cell cancer 08/09/2022   Chronic heart failure with preserved ejection fraction (HFpEF) (HCC) 07/13/2022   Agitation 04/23/2022   RSV (respiratory syncytial virus pneumonia) 04/22/2022   Obesity (BMI 30-39.9) 04/21/2022   COPD exacerbation (HCC) 04/19/2022   Dyslipidemia 04/19/2022   Gout 04/19/2022   Right kidney mass 05/14/2021   CHF exacerbation (HCC) 04/14/2021   Chest pain 04/14/2021   Syncope  04/14/2021   Left-sided weakness 10/28/2020   Typical atrial flutter (HCC)    CHF (congestive heart failure) (HCC) 07/04/2020   Acute exacerbation of CHF (congestive heart failure) (HCC) 06/16/2020   Influenza vaccine refused 05/06/2020   Acute decompensated heart failure (HCC) 05/04/2020   Illiteracy 05/04/2020   Type 2 diabetes mellitus with stage 3 chronic kidney disease (HCC) 12/25/2019   Elevated troponin I level 10/26/2019   History of gout 02/01/2019   Seasonal allergic rhinitis due to pollen 02/01/2019   Nicotine  use disorder 11/30/2018   Microscopic hematuria 11/30/2018   Depression 11/30/2018   Difficulty controlling anger 11/30/2018   COPD (chronic obstructive pulmonary disease) (HCC)    CKD (chronic kidney disease) stage 3, GFR 30-59 ml/min (HCC) 08/10/2018   Recurrent epistaxis 04/21/2018   Mixed hyperlipidemia 07/28/2017   Essential hypertension 07/28/2017   Chronic systolic heart failure (HCC) 10/25/2014   Cocaine abuse (HCC) 02/20/2013   Cannabis abuse 02/20/2013   Back pain, chronic 02/20/2013   Past Medical History:  Diagnosis Date   Arrhythmia    atrial flutter   CHF (congestive heart failure) (HCC)    Chronic kidney disease    COPD (chronic obstructive pulmonary disease) (HCC)    Coronary artery disease    Depression    Diabetes mellitus without complication (HCC)    GERD (gastroesophageal reflux disease)    Gout    Hypertension    Influenza A with respiratory manifestations    Mental disorder     Past Surgical History:  Procedure Laterality Date   ANKLE SURGERY Left    CARDIAC CATHETERIZATION     CARDIOVERSION N/A 07/08/2020   Procedure: CARDIOVERSION;  Surgeon: Hester Wolm PARAS, MD;  Location: [facility];  Service: Cardiovascular;  Laterality: N/A;   COLONOSCOPY WITH PROPOFOL  N/A 11/19/2021   Procedure: COLONOSCOPY WITH PROPOFOL ;  Surgeon: Stacia Glendia BRAVO, MD;  Location: [facility];  Service: Gastroenterology;  Laterality: N/A;   HERNIA  REPAIR     x2   IR RADIOLOGIST EVAL & MGMT  05/13/2022   IR RADIOLOGIST EVAL & MGMT  10/27/2022   IR RADIOLOGIST EVAL & MGMT  03/02/2023   IR RADIOLOGIST EVAL & MGMT  10/31/2023   POLYPECTOMY  11/19/2021   Procedure: POLYPECTOMY;  Surgeon: Stacia Glendia BRAVO, MD;  Location: [facility];  Service: Gastroenterology;;   RADIOLOGY WITH ANESTHESIA N/A 06/23/2022   Procedure: Right renal tumor ablation;  Surgeon: Jennefer Ester PARAS, MD;  Location: [facility] OR;  Service: Radiology;  Laterality: N/A;   RIGHT HEART CATH N/A 07/13/2022   Procedure: RIGHT HEART CATH;  Surgeon: Rolan Ezra RAMAN, MD;  Location: [facility] INVASIVE CV LAB;  Service: Cardiovascular;  Laterality: N/A;   RIGHT/LEFT HEART CATH AND CORONARY ANGIOGRAPHY N/A 11/19/2022   Procedure: RIGHT/LEFT HEART CATH AND CORONARY ANGIOGRAPHY;  Surgeon: Rolan Ezra RAMAN, MD;  Location: [facility] INVASIVE CV LAB;  Service: Cardiovascular;  Laterality: N/A;   SHOULDER SURGERY     TEE WITHOUT CARDIOVERSION N/A 07/08/2020   Procedure: TRANSESOPHAGEAL ECHOCARDIOGRAM (TEE);  Surgeon: Hester Wolm PARAS, MD;  Location: [facility];  Service: Cardiovascular;  Laterality: N/A;    No current facility-administered medications for this encounter.   Current Outpatient Medications  Medication Sig Dispense Refill Last Dose/Taking   albuterol  (PROVENTIL ) (2.5 MG/3ML) 0.083% nebulizer solution Take 3 mLs (2.5 mg total) by nebulization every 4 (four) hours as needed for wheezing or shortness of breath. 75 mL 6 Taking As Needed   albuterol  (VENTOLIN  HFA) 108 (90 Base) MCG/ACT inhaler Inhale 2 puffs into the lungs every 6 (six) hours as needed for wheezing or shortness of breath. 54 g 6 Taking As Needed   allopurinol  (ZYLOPRIM ) 100 MG tablet TAKE 2 TABLETS (200 MG TOTAL) BY MOUTH DAILY. 60 tablet 6 Taking   atorvastatin  (LIPITOR) 80 MG tablet Take 1 tablet (80 mg total) by mouth daily. (Patient taking differently: Take 80 mg by mouth at bedtime.) 90 tablet 1 Taking Differently    carvedilol  (COREG ) 12.5 MG tablet Take 1.5 tablets (18.75 mg total) by mouth 2 (two) times daily with a meal. 270 tablet 1 Taking   colchicine  0.6 MG tablet Take 0.5 tablets (0.3 mg total) by mouth three times a week. (Patient taking differently: Take 0.6 mg by mouth daily as needed (gout flare).) 15 tablet 2 Taking Differently   dapagliflozin  propanediol (FARXIGA ) 10 MG TABS tablet Take 1 tablet (10 mg total) by mouth daily. 90 tablet 3 Taking   DULoxetine  (CYMBALTA ) 60 MG capsule Take 1 capsule (60 mg total) by mouth daily. For pain 30 capsule 0 Taking   ELIQUIS  5 MG TABS tablet TAKE 1 TABLET BY MOUTH 2 (TWO) TIMES DAILY (AM+PM) 60 tablet 11 Taking   fluticasone -salmeterol (ADVAIR HFA) 230-21 MCG/ACT inhaler Inhale 2 puffs into the lungs 2 (two) times daily. 1 each 12 Taking   isosorbide -hydrALAZINE  (BIDIL ) 20-37.5 MG tablet Take 1 tablet by mouth 3 (three) times daily. 270 tablet 3 Taking   Menthol -Methyl Salicylate (MUSCLE RUB) 10-15 % CREA Apply 1 Application topically as needed. 85 g 0 Taking As Needed   oxyCODONE  10 MG TABS Take 1 tablet (10 mg total) by mouth every 6 (six) hours as needed for up to 16 doses for severe pain (  pain score 7-10). 16 tablet 0 Taking As Needed   potassium chloride  (KLOR-CON ) 10 MEQ tablet Take 2 tablets (20 mEq total) by mouth daily. 180 tablet 2 Taking   Tiotropium Bromide  Monohydrate (SPIRIVA  RESPIMAT) 2.5 MCG/ACT AERS Inhale 2 puffs into the lungs daily. 4 g 6 Taking   tiZANidine  (ZANAFLEX ) 4 MG tablet Take 1 tablet (4 mg total) by mouth every 6 (six) hours as needed for muscle spasms. 30 tablet 0 Taking As Needed   torsemide  (DEMADEX ) 20 MG tablet Take 20-40 mg by mouth See admin instructions. Take one tablet (20 mg) by mouth on Monday, Wednesday, Friday, Sunday. Take two tablets (40 mg) by mouth on Tuesday, Thursday and Saturday.   Taking   acetaminophen  (TYLENOL ) 500 MG tablet Take 1-2 tablets (500-1,000 mg total) by mouth every 6 (six) hours as needed (pain.).  (Patient not taking: Reported on 02/03/2024) 30 tablet 0 Not Taking   albuterol  (PROVENTIL ) (2.5 MG/3ML) 0.083% nebulizer solution Take 3 mLs (2.5 mg total) by nebulization every 4 (four) hours as needed for wheezing or shortness of breath. (Patient not taking: Reported on 02/03/2024) 300 mL 2 Not Taking   benzonatate  (TESSALON ) 100 MG capsule Take 1 capsule (100 mg total) by mouth 2 (two) times daily as needed for cough. (Patient not taking: Reported on 02/03/2024) 20 capsule 0 Not Taking   carvedilol  (COREG ) 12.5 MG tablet Take 1 tablet (12.5 mg total) by mouth 2 (two) times daily. (Patient not taking: Reported on 02/03/2024) 180 tablet 3 Not Taking   mirtazapine  (REMERON ) 15 MG tablet Take 0.5 tablets (7.5 mg total) by mouth at bedtime. (Patient not taking: Reported on 02/03/2024) 45 tablet 0 Not Taking   No Known Allergies  Social History   Tobacco Use   Smoking status: Former    Current packs/day: 1.00    Average packs/day: 1 pack/day for 43.0 years (43.0 ttl pk-yrs)    Types: Cigarettes   Smokeless tobacco: Never   Tobacco comments:        6 cigs daily--03/28/2023  Substance Use Topics   Alcohol use: Yes    Alcohol/week: 4.0 standard drinks of alcohol    Types: 4 Shots of liquor per week    Family History  Problem Relation Age of Onset   Heart disease Father    Diabetes Mother    HIV Brother    Healthy Son    Healthy Daughter      Review of Systems  Objective:  Physical Exam Vitals reviewed.  Constitutional:      Appearance: Normal appearance. He is obese.  HENT:     Head: Normocephalic and atraumatic.  Eyes:     Extraocular Movements: Extraocular movements intact.     Pupils: Pupils are equal, round, and reactive to light.  Cardiovascular:     Rate and Rhythm: Normal rate.  Pulmonary:     Effort: Pulmonary effort is normal.  Abdominal:     Palpations: Abdomen is soft.  Musculoskeletal:     Cervical back: Normal range of motion and neck supple.     Right hip:  Tenderness and bony tenderness present. Decreased range of motion. Decreased strength.  Neurological:     Mental Status: He is alert and oriented to person, place, and time.  Psychiatric:        Behavior: Behavior normal.     Vital signs in last 24 hours:    Labs:   Estimated body mass index is 36.62 kg/m as calculated from the following:  Height as of 02/08/24: 6' (1.829 m).   Weight as of 02/08/24: 122.5 kg.   Imaging Review Plain radiographs demonstrate severe degenerative joint disease of the right hip(s). The bone quality appears to be good for age and reported activity level.      Assessment/Plan:  End stage arthritis, right hip(s)  The patient history, physical examination, clinical judgement of the provider and imaging studies are consistent with end stage degenerative joint disease of the right hip(s) and total hip arthroplasty is deemed medically necessary. The treatment options including medical management, injection therapy, arthroscopy and arthroplasty were discussed at length. The risks and benefits of total hip arthroplasty were presented and reviewed. The risks due to aseptic loosening, infection, stiffness, dislocation/subluxation,  thromboembolic complications and other imponderables were discussed.  The patient acknowledged the explanation, agreed to proceed with the plan and consent was signed. Patient is being admitted for inpatient treatment for surgery, pain control, PT, OT, prophylactic antibiotics, VTE prophylaxis, progressive ambulation and ADL's and discharge planning.The patient is planning to be discharged home with home health services

## 2024-02-14 ENCOUNTER — Ambulatory Visit (HOSPITAL_COMMUNITY): Payer: Self-pay | Admitting: Physician Assistant

## 2024-02-14 ENCOUNTER — Other Ambulatory Visit: Payer: Self-pay

## 2024-02-14 ENCOUNTER — Encounter (HOSPITAL_COMMUNITY)
Admission: RE | Disposition: A | Discharge status: Home / Self Care | Payer: Self-pay | Source: Home / Self Care | Attending: Orthopaedic Surgery

## 2024-02-14 ENCOUNTER — Ambulatory Visit (HOSPITAL_COMMUNITY)

## 2024-02-14 ENCOUNTER — Observation Stay (HOSPITAL_COMMUNITY)

## 2024-02-14 ENCOUNTER — Encounter (HOSPITAL_COMMUNITY): Payer: Self-pay | Admitting: Orthopaedic Surgery

## 2024-02-14 ENCOUNTER — Ambulatory Visit (HOSPITAL_BASED_OUTPATIENT_CLINIC_OR_DEPARTMENT_OTHER): Payer: Self-pay | Admitting: Anesthesiology

## 2024-02-14 ENCOUNTER — Observation Stay (HOSPITAL_COMMUNITY)
Admission: RE | Admit: 2024-02-14 | Discharge: 2024-02-15 | Disposition: A | Discharge status: Home / Self Care | Attending: Orthopaedic Surgery | Admitting: Orthopaedic Surgery

## 2024-02-14 DIAGNOSIS — I509 Heart failure, unspecified: Secondary | ICD-10-CM | POA: Insufficient documentation

## 2024-02-14 DIAGNOSIS — I2510 Atherosclerotic heart disease of native coronary artery without angina pectoris: Secondary | ICD-10-CM | POA: Insufficient documentation

## 2024-02-14 DIAGNOSIS — I130 Hypertensive heart and chronic kidney disease with heart failure and stage 1 through stage 4 chronic kidney disease, or unspecified chronic kidney disease: Secondary | ICD-10-CM | POA: Insufficient documentation

## 2024-02-14 DIAGNOSIS — Z87891 Personal history of nicotine dependence: Secondary | ICD-10-CM | POA: Insufficient documentation

## 2024-02-14 DIAGNOSIS — Z01818 Encounter for other preprocedural examination: Secondary | ICD-10-CM

## 2024-02-14 DIAGNOSIS — I5030 Unspecified diastolic (congestive) heart failure: Secondary | ICD-10-CM

## 2024-02-14 DIAGNOSIS — Z79899 Other long term (current) drug therapy: Secondary | ICD-10-CM | POA: Diagnosis not present

## 2024-02-14 DIAGNOSIS — N1832 Chronic kidney disease, stage 3b: Secondary | ICD-10-CM | POA: Insufficient documentation

## 2024-02-14 DIAGNOSIS — Z96641 Presence of right artificial hip joint: Secondary | ICD-10-CM | POA: Diagnosis not present

## 2024-02-14 DIAGNOSIS — M1611 Unilateral primary osteoarthritis, right hip: Principal | ICD-10-CM | POA: Diagnosis present

## 2024-02-14 DIAGNOSIS — J449 Chronic obstructive pulmonary disease, unspecified: Secondary | ICD-10-CM | POA: Insufficient documentation

## 2024-02-14 DIAGNOSIS — I4891 Unspecified atrial fibrillation: Secondary | ICD-10-CM | POA: Insufficient documentation

## 2024-02-14 DIAGNOSIS — M25551 Pain in right hip: Secondary | ICD-10-CM | POA: Diagnosis present

## 2024-02-14 DIAGNOSIS — E1122 Type 2 diabetes mellitus with diabetic chronic kidney disease: Secondary | ICD-10-CM | POA: Diagnosis not present

## 2024-02-14 DIAGNOSIS — Z471 Aftercare following joint replacement surgery: Secondary | ICD-10-CM | POA: Diagnosis not present

## 2024-02-14 LAB — GLUCOSE, CAPILLARY
Glucose-Capillary: 80 mg/dL (ref 70–99)
Glucose-Capillary: 112 mg/dL — ABNORMAL HIGH (ref 70–99)
Glucose-Capillary: 141 mg/dL — ABNORMAL HIGH (ref 70–99)
Glucose-Capillary: 118 mg/dL — ABNORMAL HIGH (ref 70–99)

## 2024-02-14 SURGERY — ARTHROPLASTY, HIP, TOTAL, ANTERIOR APPROACH
Anesthesia: Spinal | Site: Hip | Laterality: Right

## 2024-02-14 MED ORDER — DAPAGLIFLOZIN PROPANEDIOL 10 MG PO TABS
10.0000 mg | ORAL_TABLET | Freq: Every day | ORAL | Status: DC
  Administered 2024-02-14 – 2024-02-15 (×2): 10 mg via ORAL
  Filled 2024-02-14 (×2): qty 1

## 2024-02-14 MED ORDER — CEFAZOLIN SODIUM-DEXTROSE 3-4 GM/150ML-% IV SOLN
3.0000 g | INTRAVENOUS | Status: AC
  Administered 2024-02-14: 3 g via INTRAVENOUS
  Filled 2024-02-14: qty 150

## 2024-02-14 MED ORDER — 0.9 % SODIUM CHLORIDE (POUR BTL) OPTIME
TOPICAL | Status: DC | PRN
  Administered 2024-02-14: 1000 mL

## 2024-02-14 MED ORDER — PROPOFOL 10 MG/ML IV BOLUS
INTRAVENOUS | Status: AC
Start: 2024-02-14 — End: 2024-02-14
  Filled 2024-02-14: qty 20

## 2024-02-14 MED ORDER — ALLOPURINOL 100 MG PO TABS
100.0000 mg | ORAL_TABLET | Freq: Every day | ORAL | Status: DC
  Administered 2024-02-14 – 2024-02-15 (×2): 100 mg via ORAL
  Filled 2024-02-14 (×2): qty 1

## 2024-02-14 MED ORDER — ALBUTEROL SULFATE (2.5 MG/3ML) 0.083% IN NEBU
2.5000 mg | INHALATION_SOLUTION | RESPIRATORY_TRACT | Status: DC | PRN
  Administered 2024-02-15: 2.5 mg via RESPIRATORY_TRACT
  Filled 2024-02-14: qty 3

## 2024-02-14 MED ORDER — ISOSORB DINITRATE-HYDRALAZINE 20-37.5 MG PO TABS
1.0000 | ORAL_TABLET | Freq: Three times a day (TID) | ORAL | Status: DC
  Administered 2024-02-14 – 2024-02-15 (×3): 1 via ORAL
  Filled 2024-02-14 (×5): qty 1

## 2024-02-14 MED ORDER — POTASSIUM CHLORIDE CRYS ER 10 MEQ PO TBCR
20.0000 meq | EXTENDED_RELEASE_TABLET | Freq: Every day | ORAL | Status: DC
  Administered 2024-02-14 – 2024-02-15 (×2): 20 meq via ORAL
  Filled 2024-02-14 (×2): qty 2

## 2024-02-14 MED ORDER — FENTANYL CITRATE (PF) 250 MCG/5ML IJ SOLN
INTRAMUSCULAR | Status: AC
  Filled 2024-02-14: qty 5

## 2024-02-14 MED ORDER — PHENYLEPHRINE HCL-NACL 20-0.9 MG/250ML-% IV SOLN
INTRAVENOUS | Status: DC | PRN
  Administered 2024-02-14: 40 ug/min via INTRAVENOUS

## 2024-02-14 MED ORDER — PROPOFOL 500 MG/50ML IV EMUL
INTRAVENOUS | Status: DC | PRN
  Administered 2024-02-14: 100 ug/kg/min via INTRAVENOUS

## 2024-02-14 MED ORDER — HYDROMORPHONE HCL 1 MG/ML IJ SOLN
INTRAMUSCULAR | Status: AC
  Filled 2024-02-14: qty 1

## 2024-02-14 MED ORDER — TORSEMIDE 20 MG PO TABS
20.0000 mg | ORAL_TABLET | ORAL | Status: DC
  Administered 2024-02-15: 20 mg via ORAL
  Filled 2024-02-14: qty 1

## 2024-02-14 MED ORDER — ACETAMINOPHEN 325 MG PO TABS: 325.0000 mg | ORAL_TABLET | Freq: Four times a day (QID) | ORAL | Status: DC | PRN

## 2024-02-14 MED ORDER — PHENOL 1.4 % MT LIQD: 1.0000 | OROMUCOSAL | Status: DC | PRN

## 2024-02-14 MED ORDER — FENTANYL CITRATE (PF) 250 MCG/5ML IJ SOLN
INTRAMUSCULAR | Status: DC | PRN
  Administered 2024-02-14: 50 ug via INTRAVENOUS

## 2024-02-14 MED ORDER — SODIUM CHLORIDE 0.9 % IV SOLN: INTRAVENOUS | Status: DC

## 2024-02-14 MED ORDER — ALUM & MAG HYDROXIDE-SIMETH 200-200-20 MG/5ML PO SUSP: 30.0000 mL | ORAL | Status: DC | PRN

## 2024-02-14 MED ORDER — ONDANSETRON HCL 4 MG/2ML IJ SOLN
INTRAMUSCULAR | Status: DC | PRN
  Administered 2024-02-14: 4 mg via INTRAVENOUS

## 2024-02-14 MED ORDER — ONDANSETRON HCL 4 MG PO TABS: 4.0000 mg | ORAL_TABLET | Freq: Four times a day (QID) | ORAL | Status: DC | PRN

## 2024-02-14 MED ORDER — DOCUSATE SODIUM 100 MG PO CAPS
100.0000 mg | ORAL_CAPSULE | Freq: Two times a day (BID) | ORAL | Status: DC
  Administered 2024-02-14 – 2024-02-15 (×2): 100 mg via ORAL
  Filled 2024-02-14 (×2): qty 1

## 2024-02-14 MED ORDER — POVIDONE-IODINE 10 % EX SWAB
2.0000 | Freq: Once | CUTANEOUS | Status: AC
  Administered 2024-02-14: 2 via TOPICAL

## 2024-02-14 MED ORDER — DROPERIDOL 2.5 MG/ML IJ SOLN: 0.6250 mg | Freq: Once | INTRAMUSCULAR | Status: DC | PRN

## 2024-02-14 MED ORDER — HYDROCORTISONE 1 % EX CREA
TOPICAL_CREAM | Freq: Three times a day (TID) | CUTANEOUS | Status: DC
  Filled 2024-02-14: qty 28

## 2024-02-14 MED ORDER — HYDROMORPHONE HCL 1 MG/ML IJ SOLN
0.5000 mg | INTRAMUSCULAR | Status: DC | PRN
  Administered 2024-02-14: 1 mg via INTRAVENOUS
  Filled 2024-02-14: qty 1

## 2024-02-14 MED ORDER — GABAPENTIN 100 MG PO CAPS
100.0000 mg | ORAL_CAPSULE | Freq: Three times a day (TID) | ORAL | Status: DC
  Administered 2024-02-14 – 2024-02-15 (×4): 100 mg via ORAL
  Filled 2024-02-14 (×4): qty 1

## 2024-02-14 MED ORDER — ORAL CARE MOUTH RINSE: 15.0000 mL | Freq: Once | OROMUCOSAL | Status: AC

## 2024-02-14 MED ORDER — EPHEDRINE SULFATE-NACL 50-0.9 MG/10ML-% IV SOSY
PREFILLED_SYRINGE | INTRAVENOUS | Status: DC | PRN
  Administered 2024-02-14: 5 mg via INTRAVENOUS

## 2024-02-14 MED ORDER — STERILE WATER FOR IRRIGATION IR SOLN
Status: DC | PRN
  Administered 2024-02-14: 1000 mL

## 2024-02-14 MED ORDER — PHENYLEPHRINE 80 MCG/ML (10ML) SYRINGE FOR IV PUSH (FOR BLOOD PRESSURE SUPPORT)
PREFILLED_SYRINGE | INTRAVENOUS | Status: DC | PRN
  Administered 2024-02-14 (×3): 80 ug via INTRAVENOUS

## 2024-02-14 MED ORDER — PROPOFOL 10 MG/ML IV BOLUS
INTRAVENOUS | Status: DC | PRN
  Administered 2024-02-14: 20 mg via INTRAVENOUS
  Administered 2024-02-14: 30 mg via INTRAVENOUS

## 2024-02-14 MED ORDER — METOCLOPRAMIDE HCL 5 MG/ML IJ SOLN: 5.0000 mg | Freq: Three times a day (TID) | INTRAMUSCULAR | Status: DC | PRN

## 2024-02-14 MED ORDER — MIDAZOLAM HCL 2 MG/2ML IJ SOLN
INTRAMUSCULAR | Status: DC | PRN
  Administered 2024-02-14 (×2): 1 mg via INTRAVENOUS

## 2024-02-14 MED ORDER — MIDAZOLAM HCL 2 MG/2ML IJ SOLN
INTRAMUSCULAR | Status: AC
  Filled 2024-02-14: qty 2

## 2024-02-14 MED ORDER — SODIUM CHLORIDE 0.9 % IR SOLN
Status: DC | PRN
Start: 2024-02-14 — End: 2024-02-14
  Administered 2024-02-14: 1000 mL

## 2024-02-14 MED ORDER — TRANEXAMIC ACID-NACL 1000-0.7 MG/100ML-% IV SOLN
1000.0000 mg | INTRAVENOUS | Status: AC
  Administered 2024-02-14: 1000 mg via INTRAVENOUS
  Filled 2024-02-14: qty 100

## 2024-02-14 MED ORDER — DEXAMETHASONE SODIUM PHOSPHATE 10 MG/ML IJ SOLN
INTRAMUSCULAR | Status: DC | PRN
  Administered 2024-02-14: 5 mg via INTRAVENOUS

## 2024-02-14 MED ORDER — LACTATED RINGERS IV SOLN: INTRAVENOUS | Status: DC

## 2024-02-14 MED ORDER — HYDROMORPHONE HCL 1 MG/ML IJ SOLN
0.2500 mg | INTRAMUSCULAR | Status: DC | PRN
  Administered 2024-02-14 (×4): 0.5 mg via INTRAVENOUS

## 2024-02-14 MED ORDER — ATORVASTATIN CALCIUM 80 MG PO TABS
80.0000 mg | ORAL_TABLET | Freq: Every day | ORAL | Status: DC
  Administered 2024-02-14: 80 mg via ORAL
  Filled 2024-02-14: qty 1

## 2024-02-14 MED ORDER — BUPIVACAINE IN DEXTROSE 0.75-8.25 % IT SOLN
INTRATHECAL | Status: DC | PRN
  Administered 2024-02-14: 1.7 mL via INTRATHECAL

## 2024-02-14 MED ORDER — POLYETHYLENE GLYCOL 3350 17 G PO PACK: 17.0000 g | PACK | Freq: Every day | ORAL | Status: DC | PRN

## 2024-02-14 MED ORDER — ACETAMINOPHEN 500 MG PO TABS
1000.0000 mg | ORAL_TABLET | Freq: Once | ORAL | Status: AC
  Administered 2024-02-14: 1000 mg via ORAL
  Filled 2024-02-14: qty 2

## 2024-02-14 MED ORDER — PANTOPRAZOLE SODIUM 40 MG PO TBEC
40.0000 mg | DELAYED_RELEASE_TABLET | Freq: Every day | ORAL | Status: DC
  Administered 2024-02-14 – 2024-02-15 (×2): 40 mg via ORAL
  Filled 2024-02-14 (×2): qty 1

## 2024-02-14 MED ORDER — CHLORHEXIDINE GLUCONATE 0.12 % MT SOLN
15.0000 mL | Freq: Once | OROMUCOSAL | Status: AC
  Administered 2024-02-14: 15 mL via OROMUCOSAL
  Filled 2024-02-14: qty 15

## 2024-02-14 MED ORDER — APIXABAN 5 MG PO TABS
5.0000 mg | ORAL_TABLET | Freq: Two times a day (BID) | ORAL | Status: DC
  Administered 2024-02-15: 5 mg via ORAL
  Filled 2024-02-14 (×2): qty 1

## 2024-02-14 MED ORDER — TORSEMIDE 20 MG PO TABS: 20.0000 mg | ORAL_TABLET | ORAL | Status: DC

## 2024-02-14 MED ORDER — DEXAMETHASONE SODIUM PHOSPHATE 10 MG/ML IJ SOLN
INTRAMUSCULAR | Status: AC
  Filled 2024-02-14: qty 1

## 2024-02-14 MED ORDER — OXYCODONE HCL 5 MG PO TABS
10.0000 mg | ORAL_TABLET | ORAL | Status: DC | PRN
  Administered 2024-02-14 – 2024-02-15 (×5): 10 mg via ORAL
  Filled 2024-02-14 (×3): qty 2

## 2024-02-14 MED ORDER — CARVEDILOL 3.125 MG PO TABS
18.7500 mg | ORAL_TABLET | Freq: Once | ORAL | Status: AC
  Administered 2024-02-14: 18.75 mg via ORAL
  Filled 2024-02-14: qty 2

## 2024-02-14 MED ORDER — OXYCODONE HCL 5 MG PO TABS
5.0000 mg | ORAL_TABLET | ORAL | Status: DC | PRN
  Filled 2024-02-14 (×2): qty 2

## 2024-02-14 MED ORDER — ALBUTEROL SULFATE HFA 108 (90 BASE) MCG/ACT IN AERS
INHALATION_SPRAY | RESPIRATORY_TRACT | Status: DC | PRN
Start: 2024-02-14 — End: 2024-02-14
  Administered 2024-02-14: 4 via RESPIRATORY_TRACT

## 2024-02-14 MED ORDER — TORSEMIDE 20 MG PO TABS: 40.0000 mg | ORAL_TABLET | ORAL | Status: DC

## 2024-02-14 MED ORDER — DULOXETINE HCL 60 MG PO CPEP
60.0000 mg | ORAL_CAPSULE | Freq: Every day | ORAL | Status: DC
  Administered 2024-02-14 – 2024-02-15 (×2): 60 mg via ORAL
  Filled 2024-02-14 (×2): qty 1

## 2024-02-14 MED ORDER — ALBUMIN HUMAN 5 % IV SOLN
INTRAVENOUS | Status: DC | PRN
Start: 2024-02-14 — End: 2024-02-14

## 2024-02-14 MED ORDER — PROPOFOL 1000 MG/100ML IV EMUL
INTRAVENOUS | Status: AC
  Filled 2024-02-14: qty 100

## 2024-02-14 MED ORDER — ONDANSETRON HCL 4 MG/2ML IJ SOLN: 4.0000 mg | Freq: Four times a day (QID) | INTRAMUSCULAR | Status: DC | PRN

## 2024-02-14 MED ORDER — CEFAZOLIN SODIUM-DEXTROSE 2-4 GM/100ML-% IV SOLN
2.0000 g | Freq: Four times a day (QID) | INTRAVENOUS | Status: AC
  Administered 2024-02-14 (×2): 2 g via INTRAVENOUS
  Filled 2024-02-14 (×2): qty 100

## 2024-02-14 MED ORDER — ONDANSETRON HCL 4 MG/2ML IJ SOLN
INTRAMUSCULAR | Status: AC
  Filled 2024-02-14: qty 2

## 2024-02-14 MED ORDER — EPHEDRINE 5 MG/ML INJ
INTRAVENOUS | Status: AC
  Filled 2024-02-14: qty 5

## 2024-02-14 MED ORDER — MENTHOL 3 MG MT LOZG: 1.0000 | LOZENGE | OROMUCOSAL | Status: DC | PRN

## 2024-02-14 MED ORDER — CARVEDILOL 6.25 MG PO TABS
18.7500 mg | ORAL_TABLET | Freq: Two times a day (BID) | ORAL | Status: DC
  Administered 2024-02-14 – 2024-02-15 (×2): 18.75 mg via ORAL
  Filled 2024-02-14 (×2): qty 1

## 2024-02-14 MED ORDER — CARVEDILOL 12.5 MG PO TABS: 12.5000 mg | ORAL_TABLET | Freq: Once | ORAL | Status: DC

## 2024-02-14 MED ORDER — UMECLIDINIUM BROMIDE 62.5 MCG/ACT IN AEPB
1.0000 | INHALATION_SPRAY | Freq: Every day | RESPIRATORY_TRACT | Status: DC
  Filled 2024-02-14: qty 7

## 2024-02-14 MED ORDER — TIZANIDINE HCL 4 MG PO TABS
4.0000 mg | ORAL_TABLET | Freq: Four times a day (QID) | ORAL | Status: DC | PRN
  Administered 2024-02-14 – 2024-02-15 (×4): 4 mg via ORAL
  Filled 2024-02-14 (×4): qty 1

## 2024-02-14 MED ORDER — METOCLOPRAMIDE HCL 5 MG PO TABS: 5.0000 mg | ORAL_TABLET | Freq: Three times a day (TID) | ORAL | Status: DC | PRN

## 2024-02-14 MED ORDER — DIPHENHYDRAMINE HCL 12.5 MG/5ML PO ELIX
12.5000 mg | ORAL_SOLUTION | ORAL | Status: DC | PRN
  Filled 2024-02-14: qty 10

## 2024-02-14 MED ORDER — MOMETASONE FURO-FORMOTEROL FUM 200-5 MCG/ACT IN AERO
2.0000 | INHALATION_SPRAY | Freq: Two times a day (BID) | RESPIRATORY_TRACT | Status: DC
  Administered 2024-02-14 – 2024-02-15 (×2): 2 via RESPIRATORY_TRACT
  Filled 2024-02-14: qty 8.8

## 2024-02-14 SURGICAL SUPPLY — 42 items
BAG COUNTER SPONGE SURGICOUNT (BAG) ×1 IMPLANT
BENZOIN TINCTURE PRP APPL 2/3 (GAUZE/BANDAGES/DRESSINGS) ×1 IMPLANT
BLADE CLIPPER SURG (BLADE) IMPLANT
BLADE SAW SGTL 18X1.27X75 (BLADE) ×1 IMPLANT
COVER SURGICAL LIGHT HANDLE (MISCELLANEOUS) ×1 IMPLANT
CUP ACET PNNCL SECTR W/GRIP 56 (Hips) IMPLANT
DRAPE C-ARM 42X72 X-RAY (DRAPES) ×1 IMPLANT
DRAPE STERI IOBAN 125X83 (DRAPES) ×1 IMPLANT
DRAPE U-SHAPE 47X51 STRL (DRAPES) ×3 IMPLANT
DRSG AQUACEL AG ADV 3.5X10 (GAUZE/BANDAGES/DRESSINGS) ×1 IMPLANT
DURAPREP 26ML APPLICATOR (WOUND CARE) ×1 IMPLANT
ELECT BLADE 6.5 EXT (BLADE) IMPLANT
ELECTRODE BLDE 4.0 EZ CLN MEGD (MISCELLANEOUS) ×1 IMPLANT
ELECTRODE REM PT RTRN 9FT ADLT (ELECTROSURGICAL) ×1 IMPLANT
FACESHIELD WRAPAROUND OR TEAM (MASK) ×2 IMPLANT
GLOVE BIOGEL PI IND STRL 8 (GLOVE) ×2 IMPLANT
GLOVE ECLIPSE 8.0 STRL XLNG CF (GLOVE) ×1 IMPLANT
GLOVE ORTHO TXT STRL SZ7.5 (GLOVE) ×2 IMPLANT
GOWN STRL REUS W/ TWL LRG LVL3 (GOWN DISPOSABLE) ×2 IMPLANT
GOWN STRL REUS W/ TWL XL LVL3 (GOWN DISPOSABLE) ×2 IMPLANT
HEAD CERAMIC DELTA 36 PLUS 1.5 (Hips) IMPLANT
KIT BASIN OR (CUSTOM PROCEDURE TRAY) ×1 IMPLANT
KIT TURNOVER KIT B (KITS) ×1 IMPLANT
MANIFOLD NEPTUNE II (INSTRUMENTS) ×1 IMPLANT
NS IRRIG 1000ML POUR BTL (IV SOLUTION) ×1 IMPLANT
PACK TOTAL JOINT (CUSTOM PROCEDURE TRAY) ×1 IMPLANT
PAD ARMBOARD POSITIONER FOAM (MISCELLANEOUS) ×1 IMPLANT
PINNACLE ALTRX PLUS 4 N 36X56 (Hips) IMPLANT
SET HNDPC FAN SPRY TIP SCT (DISPOSABLE) ×1 IMPLANT
STAPLER SKIN PROX 35W (STAPLE) IMPLANT
STEM FEM ACTIS HIGH SZ7 (Stem) IMPLANT
STRIP CLOSURE SKIN 1/2X4 (GAUZE/BANDAGES/DRESSINGS) ×2 IMPLANT
SUT ETHIBOND NAB CT1 #1 30IN (SUTURE) ×1 IMPLANT
SUT MNCRL AB 4-0 PS2 18 (SUTURE) IMPLANT
SUT VIC AB 0 CT1 27XBRD ANBCTR (SUTURE) ×1 IMPLANT
SUT VIC AB 1 CT1 27XBRD ANBCTR (SUTURE) ×1 IMPLANT
SUT VIC AB 2-0 CT1 TAPERPNT 27 (SUTURE) ×1 IMPLANT
TOWEL GREEN STERILE (TOWEL DISPOSABLE) ×1 IMPLANT
TOWEL GREEN STERILE FF (TOWEL DISPOSABLE) ×1 IMPLANT
TRAY CATH INTERMITTENT SS 16FR (CATHETERS) IMPLANT
TRAY FOLEY W/BAG SLVR 16FR ST (SET/KITS/TRAYS/PACK) IMPLANT
WATER STERILE IRR 1000ML POUR (IV SOLUTION) ×2 IMPLANT

## 2024-02-14 NOTE — Op Note (Signed)
 Operative Note  Date of operation: 02/14/2024 Preoperative diagnosis: Right hip primary osteoarthritis Postoperative diagnosis: Same  Procedure: Right direct anterior total hip arthroplasty  Implants: Implant Name Type Inv. Item Serial No. Manufacturer Lot No. LRB No. Used Action  CUP ACET PNNCL SECTR W/GRIP 56 - [id number] Hips CUP ACET PNNCL SECTR W/GRIP 56  [employer] [id number] Right 1 Implanted  PINNACLE ALTRX PLUS 4 N [id number] - [id number] Hips PINNACLE ALTRX PLUS 4 N [id number]  [employer] [id number] Right 1 Implanted  STEM FEM ACTIS HIGH SZ7 - [id number] Stem STEM FEM ACTIS HIGH SZ7 1010-12-070 [employer] [id number] Right 1 Implanted  HEAD CERAMIC DELTA 36 PLUS 1.5 - [id number] Hips HEAD CERAMIC DELTA 36 PLUS 1.5 1365-36-310 [employer] [id number] Right 1 Implanted   Surgeon: Lonni GRADE. Vernetta, MD Assistant: Tory Gaskins, PA-C  Anesthesia: Spinal EBL: 200 cc Antibiotics: IV Ancef  Complications: None  Indications: The patient is a 63 year old gentleman with debilitating arthritis of his right hip that is causing debilitating right hip pain.  This is on a daily basis and it is detrimentally affecting his mobility, his quality of life and his activities of daily living.  He has tried and failed all forms conservative treatment including a steroid injection in that hip.  He does ambulate using a cane and a crutch at times.  His pain is 10 out of 10.  At this point he wishes to proceed with a total hip arthroplasty and given the arthritis in his right hip on clinical exam and x-ray findings, we agree with this.  The risks of acute blood loss anemia, nerve and vessel injury, fracture, infection, DVT, dislocation, implant failure, leg length differences and wound healing issues were discussed.  He understands that our goals are hopefully decreased pain, improved mobility and improved quality of life.  Procedure description: After informed consent was obtained and the  appropriate right hip was marked, the patient is brought to the operating room and set up on the stretcher where spinal anesthesia was obtained.  He was then laid in supine position on the stretcher and a Foley catheter was placed.  Traction boots were next placed on both his feet and he was placed supine on the Hana fracture table with a perineal post in place and both legs in inline skeletal traction devices but no traction applied.  His right operative hip and pelvis were assessed radiographically.  The right hip was prepped and draped with DuraPrep and sterile drapes.  A timeout was called and he was identified as the correct patient the correct right hip.  An incision was then made just inferior and posterior to the ASIS and carried slightly obliquely down the leg.  Dissection was carried down to the tensor fascia lata muscle and the tensor fascia was divided longitudinally to proceed with a direct intra approach the hip.  Circumflex vessels were identified and cauterized.  The hip capsule was identified and opened up in L-type format finding a large joint effusion.  Cobra retractors were placed around the medial and lateral femoral neck and a femoral neck cut was made with an oscillating saw just proximal to the lesser trochanter and this cut was completed with an osteotome.  A corkscrew guide was placed in the femoral head and the femoral head was removed in its entirety.  A bent Hohmann was then placed over the medial acetabular rim and remnants of the acetabular labrum and other debris removed.  Reaming was then initiated from a size 43 reamer and  stepwise increments going up to a size 55 reamer with all reamers placed under direct visualization and the last reamer placed under direct fluoroscopy in order to obtain the depth of reaming, the inclination and the anteversion.  The real DePuy Sectra GRIPTION acetabular component size 56 was then placed without difficulty followed by a 36+4 polythene liner.   Attention was then turned to the femur.  With the right leg externally rotated to 120 degrees, extended and adducted, a Mueller retractor was placed medially and a Hohmann retractor behind the greater trochanter.  A box cutting osteotome was used into the femoral canal and the lateral joint capsule was released.  Broaching was then initiated using the Actis broaching system from a size 0 going up to a size 7.  With a size 7 in place we trialed a high offset femoral neck combined with a 36+1.5 trial head ball.  The right leg was brought over and up and with traction and internal rotation reduced in the pelvis.  Based on radiographic and clinical assessment we are pleased with leg length, offset, range of motion and stability.  The hip was then dislocated and the trial components were removed.  The real Actis femoral component with high offset size 7 was then placed followed by the real 36+1.5 ceramic head ball.  Again this was reduced in the pelvis and we are pleased with radiographic and clinical assessment of the hip stability.  We then irrigated the soft tissue with normal saline solution.  Remnants of the joint capsule were closed with interrupted #1 Ethibond suture followed by #1 Vicryl to close the tensor fascia.  0 Vicryl was used to close the deep tissue and 2-0 Vicryl was used to close the subcutaneous tissue.  The skin was closed with staples.  An Aquacel dressing was applied.  The patient was taken off the Hana table and taken the recovery room.  Tory Gaskins, PA-C did assist during the entire case and beginning to end and his assistance was crucial and medically necessary for soft tissue management and retraction, helping guide implant placement and a layered closure of the wound.

## 2024-02-14 NOTE — Interval H&P Note (Signed)
 History and Physical Interval Note: The patient understands that he is here today for right total hip replacement to treat his significant right hip pain and arthritis.  There has been no acute or interval change in his medical status.  The risks and benefits of surgery have been discussed in detail and informed consent has been obtained.  The right operative hip has been marked.  02/14/2024 7:15 AM  Chris [name]  has presented today for surgery, with the diagnosis of right hip osteoarthritis.  The various methods of treatment have been discussed with the patient and family. After consideration of risks, benefits and other options for treatment, the patient has consented to  Procedure(s): ARTHROPLASTY, HIP, TOTAL, ANTERIOR APPROACH (Right) as a surgical intervention.  The patient's history has been reviewed, patient examined, no change in status, stable for surgery.  I have reviewed the patient's chart and labs.  Questions were answered to the patient's satisfaction.     Lonni CINDERELLA Poli

## 2024-02-14 NOTE — Evaluation (Signed)
 Physical Therapy Evaluation Patient Details Name: Chris [name] MRN: [id number] DOB: [birth year] Today's Date: 02/14/2024  History of Present Illness  63 y.o. male presents to [facility] hospital on 02/14/2024 for elective R THA. PMH includes HLD, HTN, CKD, COPD, depression, gout, DMII, CHF.  Clinical Impression  Pt presents to PT with deficits in activity tolerance, strength, power, gait, balance. Pt is impulsive with mobility and after ambulating with support of RW attempts to initiate ambulation without UE support, does follow PT cues to stop due to increased falls risk. PT will follow up in an effort to improve RLE strength and to restore independence.        If plan is discharge home, recommend the following: A little help with walking and/or transfers;A little help with bathing/dressing/bathroom;Assistance with cooking/housework;Assist for transportation;Help with stairs or ramp for entrance   Can travel by private vehicle        Equipment Recommendations Rolling walker (2 wheels);BSC/3in1  Recommendations for Other Services       Functional Status Assessment Patient has had a recent decline in their functional status and demonstrates the ability to make significant improvements in function in a reasonable and predictable amount of time.     Precautions / Restrictions Precautions Precautions: Fall Recall of Precautions/Restrictions: Impaired Precaution/Restrictions Comments: direct anterior THA Restrictions Weight Bearing Restrictions Per Provider Order: Yes RLE Weight Bearing Per Provider Order: Weight bearing as tolerated      Mobility  Bed Mobility Overal bed mobility: Needs Assistance Bed Mobility: Supine to Sit, Sit to Supine     Supine to sit: Min assist Sit to supine: Min assist   General bed mobility comments: assist for RLE, hand hold to pull trunk into sitting for supine to sit    Transfers Overall transfer level: Needs assistance Equipment used: Rolling walker  (2 wheels) Transfers: Sit to/from Stand Sit to Stand: Contact guard assist, From elevated surface                Ambulation/Gait Ambulation/Gait assistance: Contact guard assist Gait Distance (Feet): 140 Feet Assistive device: Rolling walker (2 wheels) Gait Pattern/deviations: Step-through pattern Gait velocity: reduced Gait velocity interpretation: <1.8 ft/sec, indicate of risk for recurrent falls   General Gait Details: slowed step-through gait, 2 brief standing rest breaks as pt feeling the urge to urinate (still with catheter in place)  Stairs            Wheelchair Mobility     Tilt Bed    Modified Rankin (Stroke Patients Only)       Balance Overall balance assessment: Needs assistance Sitting-balance support: No upper extremity supported, Feet supported Sitting balance-Leahy Scale: Good     Standing balance support: Single extremity supported, Reliant on assistive device for balance Standing balance-Leahy Scale: Poor                               Pertinent Vitals/Pain Pain Assessment Pain Assessment: 0-10 Pain Score: 10-Worst pain ever Pain Location: R hip Pain Descriptors / Indicators: Sore Pain Intervention(s): Monitored during session    Home Living Family/patient expects to be discharged to:: Private residence Living Arrangements: Alone Available Help at Discharge: Family;Friend(s);Available PRN/intermittently (pt seems uncertain about caregiver support) Type of Home: House Home Access: Stairs to enter Entrance Stairs-Rails: Can reach both Entrance Stairs-Number of Steps: 7   Home Layout: One level Home Equipment: Cane - single point      Prior Function Prior Level of  Function : Independent/Modified Independent;Driving             Mobility Comments: ambulatory with [facility]       Extremity/Trunk Assessment   Upper Extremity Assessment Upper Extremity Assessment: Overall WFL for tasks assessed    Lower Extremity  Assessment Lower Extremity Assessment: RLE deficits/detail RLE Deficits / Details: generalized post-op weakness    Cervical / Trunk Assessment Cervical / Trunk Assessment: Normal  Communication   Communication Communication: No apparent difficulties    Cognition Arousal: Alert (intermittent lethargy during history) Behavior During Therapy: Impulsive   PT - Cognitive impairments: Awareness, Safety/Judgement                         Following commands: Intact       Cueing Cueing Techniques: Verbal cues     General Comments General comments (skin integrity, edema, etc.): VSS on RA    Exercises Other Exercises Other Exercises: PT provides surgical hip exercise handout, will need further review as pt falling asleep with PT attempts at providing education   Assessment/Plan    PT Assessment Patient needs continued PT services  PT Problem List Decreased strength;Decreased activity tolerance;Decreased balance;Decreased mobility;Decreased knowledge of use of DME;Decreased safety awareness;Decreased knowledge of precautions;Pain       PT Treatment Interventions DME instruction;Stair training;Functional mobility training;Gait training;Therapeutic activities;Therapeutic exercise;Balance training;Neuromuscular re-education;Patient/family education    PT Goals (Current goals can be found in the Care Plan section)  Acute Rehab PT Goals Patient Stated Goal: to return to independence PT Goal Formulation: With patient Time For Goal Achievement: 02/18/24 Potential to Achieve Goals: Good    Frequency 7X/week     Co-evaluation               AM-PAC PT 6 Clicks Mobility  Outcome Measure Help needed turning from your back to your side while in a flat bed without using bedrails?: A Little Help needed moving from lying on your back to sitting on the side of a flat bed without using bedrails?: A Little Help needed moving to and from a bed to a chair (including a  wheelchair)?: A Little Help needed standing up from a chair using your arms (e.g., wheelchair or bedside chair)?: A Little Help needed to walk in hospital room?: A Little Help needed climbing 3-5 steps with a railing? : A Lot 6 Click Score: 17    End of Session Equipment Utilized During Treatment: Gait belt Activity Tolerance: Patient tolerated treatment well Patient left: in bed;with call bell/phone within reach;with bed alarm set Nurse Communication: Mobility status PT Visit Diagnosis: Other abnormalities of gait and mobility (R26.89);Muscle weakness (generalized) (M62.81)    Time: 1239-1300 PT Time Calculation (min) (ACUTE ONLY): 21 min   Charges:   PT Evaluation $PT Eval Low Complexity: 1 Low   PT General Charges $$ ACUTE PT VISIT: 1 Visit         Bernardino JINNY Ruth, PT, DPT Acute Rehabilitation Office [phone]   Bernardino JINNY Ruth 02/14/2024, 1:10 PM

## 2024-02-14 NOTE — Anesthesia Procedure Notes (Signed)
 Spinal  Patient location during procedure: OR Start time: 02/14/2024 7:37 AM End time: 02/14/2024 7:40 AM Reason for block: surgical anesthesia Staffing Performed: anesthesiologist  Anesthesiologist: Paul Lamarr BRAVO, MD Performed by: Paul Lamarr BRAVO, MD Authorized by: Paul Lamarr BRAVO, MD   Preanesthetic Checklist Completed: patient identified, IV checked, risks and benefits discussed, surgical consent, monitors and equipment checked, pre-op evaluation and timeout performed Spinal Block Patient position: sitting Prep: DuraPrep and site prepped and draped Patient monitoring: continuous pulse ox, blood pressure and heart rate Approach: midline Location: L3-4 Injection technique: single-shot Needle Needle type: Pencan  Needle gauge: 24 G Needle length: 9 cm Assessment Events: CSF return Additional Notes Risks, benefits, and alternative discussed. Patient gave consent to procedure. Prepped and draped in sitting position. Patient sedated but responsive to voice. Clear CSF obtained after one needle pass. Positive terminal aspiration. No pain or paraesthesias with injection. Patient tolerated procedure well. Vital signs stable. LANEY Paul, MD

## 2024-02-14 NOTE — Anesthesia Procedure Notes (Signed)
 Procedure Name: MAC Date/Time: 02/14/2024 7:30 AM  Performed by: Jolynn Mage, CRNAPre-anesthesia Checklist: Patient identified, Emergency Drugs available, Suction available, Timeout performed and Patient being monitored Patient Re-evaluated:Patient Re-evaluated prior to induction Oxygen  Delivery Method: Simple face mask

## 2024-02-14 NOTE — Discharge Instructions (Signed)
 Per [facility] clinic policy, our goal is ensure optimal postoperative pain control with a multimodal pain management strategy. For all [facility] patients, our goal is to wean post-operative narcotic medications by 6 weeks post-operatively. If this is not possible due to utilization of pain medication prior to surgery, your [facility] doctor will support your acute post-operative pain control for the first 6 weeks postoperatively, with a plan to transition you back to your primary pain team following that. Chris [name] will work to ensure a Therapist, occupational.  INSTRUCTIONS AFTER JOINT REPLACEMENT   Remove items at home which could result in a fall. This includes throw rugs or furniture in walking pathways ICE to the affected joint every three hours while awake for 30 minutes at a time, for at least the first 3-5 days, and then as needed for pain and swelling.  Continue to use ice for pain and swelling. You may notice swelling that will progress down to the foot and ankle.  This is normal after surgery.  Elevate your leg when you are not up walking on it.   Continue to use the breathing machine you got in the hospital (incentive spirometer) which will help keep your temperature down.  It is common for your temperature to cycle up and down following surgery, especially at night when you are not up moving around and exerting yourself.  The breathing machine keeps your lungs expanded and your temperature down.   DIET:  As you were doing prior to hospitalization, we recommend a well-balanced diet.  DRESSING / WOUND CARE / SHOWERING  Keep the surgical dressing until follow up.  The dressing is water  proof, so you can shower without any extra covering.  IF THE DRESSING FALLS OFF or the wound gets wet inside, change the dressing with sterile gauze.  Please use good hand washing techniques before changing the dressing.  Do not use any lotions or creams on the incision until instructed by your surgeon.     ACTIVITY  Increase activity slowly as tolerated, but follow the weight bearing instructions below.   No driving for 6 weeks or until further direction given by your physician.  You cannot drive while taking narcotics.  No lifting or carrying greater than 10 lbs. until further directed by your surgeon. Avoid periods of inactivity such as sitting longer than an hour when not asleep. This helps prevent blood clots.  You may return to work once you are authorized by your doctor.     WEIGHT BEARING   Weight bearing as tolerated with assist device (walker, cane, etc) as directed, use it as long as suggested by your surgeon or therapist, typically at least 4-6 weeks.   EXERCISES  Results after joint replacement surgery are often greatly improved when you follow the exercise, range of motion and muscle strengthening exercises prescribed by your doctor. Safety measures are also important to protect the joint from further injury. Any time any of these exercises cause you to have increased pain or swelling, decrease what you are doing until you are comfortable again and then slowly increase them. If you have problems or questions, call your caregiver or physical therapist for advice.   Rehabilitation is important following a joint replacement. After just a few days of immobilization, the muscles of the leg can become weakened and shrink (atrophy).  These exercises are designed to build up the tone and strength of the thigh and leg muscles and to improve motion. Often times heat used for twenty to thirty minutes before  working out will loosen up your tissues and help with improving the range of motion but do not use heat for the first two weeks following surgery (sometimes heat can increase post-operative swelling).   These exercises can be done on a training (exercise) mat, on the floor, on a table or on a bed. Use whatever works the best and is most comfortable for you.    Use music or television  while you are exercising so that the exercises are a pleasant break in your day. This will make your life better with the exercises acting as a break in your routine that you can look forward to.   Perform all exercises about fifteen times, three times per day or as directed.  You should exercise both the operative leg and the other leg as well.  Exercises include:   Quad Sets - Tighten up the muscle on the front of the thigh (Quad) and hold for 5-10 seconds.   Straight Leg Raises - With your knee straight (if you were given a brace, keep it on), lift the leg to 60 degrees, hold for 3 seconds, and slowly lower the leg.  Perform this exercise against resistance later as your leg gets stronger.  Leg Slides: Lying on your back, slowly slide your foot toward your buttocks, bending your knee up off the floor (only go as far as is comfortable). Then slowly slide your foot back down until your leg is flat on the floor again.  Angel Wings: Lying on your back spread your legs to the side as far apart as you can without causing discomfort.  Hamstring Strength:  Lying on your back, push your heel against the floor with your leg straight by tightening up the muscles of your buttocks.  Repeat, but this time bend your knee to a comfortable angle, and push your heel against the floor.  You may put a pillow under the heel to make it more comfortable if necessary.   A rehabilitation program following joint replacement surgery can speed recovery and prevent re-injury in the future due to weakened muscles. Contact your doctor or a physical therapist for more information on knee rehabilitation.    CONSTIPATION  Constipation is defined medically as fewer than three stools per week and severe constipation as less than one stool per week.  Even if you have a regular bowel pattern at home, your normal regimen is likely to be disrupted due to multiple reasons following surgery.  Combination of anesthesia, postoperative  narcotics, change in appetite and fluid intake all can affect your bowels.   YOU MUST use at least one of the following options; they are listed in order of increasing strength to get the job done.  They are all available over the counter, and you may need to use some, POSSIBLY even all of these options:    Drink plenty of fluids (prune juice may be helpful) and high fiber foods Colace 100 mg by mouth twice a day  Senokot for constipation as directed and as needed Dulcolax (bisacodyl), take with full glass of water  Miralax (polyethylene glycol) once or twice a day as needed.  If you have tried all these things and are unable to have a bowel movement in the first 3-4 days after surgery call either your surgeon or your primary doctor.    If you experience loose stools or diarrhea, hold the medications until you stool forms back up.  If your symptoms do not get better within 1 week  or if they get worse, check with your doctor.  If you experience the worst abdominal pain ever or develop nausea or vomiting, please contact the office immediately for further recommendations for treatment.   ITCHING:  If you experience itching with your medications, try taking only a single pain pill, or even half a pain pill at a time.  You can also use Benadryl  over the counter for itching or also to help with sleep.   TED HOSE STOCKINGS:  Use stockings on both legs until for at least 2 weeks or as directed by physician office. They may be removed at night for sleeping.  MEDICATIONS:  See your medication summary on the "After Visit Summary" that nursing will review with you.  You may have some home medications which will be placed on hold until you complete the course of blood thinner medication.  It is important for you to complete the blood thinner medication as prescribed.  PRECAUTIONS:  If you experience chest pain or shortness of breath - call 911 immediately for transfer to the hospital emergency department.    If you develop a fever greater that 101 F, purulent drainage from wound, increased redness or drainage from wound, foul odor from the wound/dressing, or calf pain - CONTACT YOUR SURGEON.                                                   FOLLOW-UP APPOINTMENTS:  If you do not already have a post-op appointment, please call the office for an appointment to be seen by your surgeon.  Guidelines for how soon to be seen are listed in your "After Visit Summary", but are typically between 1-4 weeks after surgery.  OTHER INSTRUCTIONS:   Knee Replacement:  Do not place pillow under knee, focus on keeping the knee straight while resting. CPM instructions: 0-90 degrees, 2 hours in the morning, 2 hours in the afternoon, and 2 hours in the evening. Place foam block, curve side up under heel at all times except when in CPM or when walking.  DO NOT modify, tear, cut, or change the foam block in any way.  POST-OPERATIVE OPIOID TAPER INSTRUCTIONS: It is important to wean off of your opioid medication as soon as possible. If you do not need pain medication after your surgery it is ok to stop day one. Opioids include: Codeine, Hydrocodone(Norco, Vicodin), Oxycodone (Percocet, oxycontin ) and hydromorphone  amongst others.  Long term and even short term use of opiods can cause: Increased pain response Dependence Constipation Depression Respiratory depression And more.  Withdrawal symptoms can include Flu like symptoms Nausea, vomiting And more Techniques to manage these symptoms Hydrate well Eat regular healthy meals Stay active Use relaxation techniques(deep breathing, meditating, yoga) Do Not substitute Alcohol to help with tapering If you have been on opioids for less than two weeks and do not have pain than it is ok to stop all together.  Plan to wean off of opioids This plan should start within one week post op of your joint replacement. Maintain the same interval or time between taking each dose  and first decrease the dose.  Cut the total daily intake of opioids by one tablet each day Next start to increase the time between doses. The last dose that should be eliminated is the evening dose.   MAKE SURE YOU:  Understand these instructions.  Get help right away if you are not doing well or get worse.    Thank you for letting us  be a part of your medical care team.  It is a privilege we respect greatly.  We hope these instructions will help you stay on track for a fast and full recovery!     Dental Antibiotics:  In most cases prophylactic antibiotics for Dental procdeures after total joint surgery are not necessary.  Exceptions are as follows:  1. History of prior total joint infection  2. Severely immunocompromised (Organ Transplant, cancer chemotherapy, Rheumatoid biologic meds such as Humera)  3. Poorly controlled diabetes (A1C &gt; 8.0, blood glucose over 200)  If you have one of these conditions, contact your surgeon for an antibiotic prescription, prior to your dental procedure.

## 2024-02-14 NOTE — Transfer of Care (Signed)
 Immediate Anesthesia Transfer of Care Note  Patient: Chris [name]  Procedure(s) Performed: ARTHROPLASTY, HIP, TOTAL, ANTERIOR APPROACH, RIGHT (Right: Hip)  Patient Location: PACU  Anesthesia Type:Spinal  Level of Consciousness: drowsy, patient cooperative, and responds to stimulation  Airway & Oxygen  Therapy: Patient Spontanous Breathing and Patient connected to face mask oxygen   Post-op Assessment: Report given to RN and Post -op Vital signs reviewed and stable  Post vital signs: Reviewed and stable  Last Vitals:  Vitals Value Taken Time  BP 115/75 02/14/24 09:17  Temp 36.4 C 02/14/24 09:18  Pulse 65 02/14/24 09:25  Resp 37 02/14/24 09:25  SpO2 93 % 02/14/24 09:25  Vitals shown include unfiled device data.  Last Pain:  Vitals:   02/14/24 0918  TempSrc:   PainSc: 0-No pain      Patients Stated Pain Goal: 4 (02/14/24 9394)  Complications: No notable events documented.

## 2024-02-14 NOTE — Anesthesia Postprocedure Evaluation (Signed)
 Anesthesia Post Note  Patient: Chris [name]  Procedure(s) Performed: ARTHROPLASTY, HIP, TOTAL, ANTERIOR APPROACH, RIGHT (Right: Hip)     Patient location during evaluation: PACU Anesthesia Type: Spinal Level of consciousness: awake and alert Pain management: pain level controlled Vital Signs Assessment: post-procedure vital signs reviewed and stable Respiratory status: spontaneous breathing, nonlabored ventilation and respiratory function stable Cardiovascular status: blood pressure returned to baseline Postop Assessment: no apparent nausea or vomiting, spinal receding, no headache and no backache Anesthetic complications: no   No notable events documented.  Last Vitals:  Vitals:   02/14/24 1027 02/14/24 1042  BP:  (!) 126/90  Pulse:  75  Resp:  18  Temp: (!) 36.4 C 36.6 C  SpO2:  96%    Last Pain:  Vitals:   02/14/24 1015  TempSrc:   PainSc: Asleep                 Vertell Row

## 2024-02-15 ENCOUNTER — Encounter (HOSPITAL_COMMUNITY): Payer: Self-pay | Admitting: Orthopaedic Surgery

## 2024-02-15 DIAGNOSIS — J449 Chronic obstructive pulmonary disease, unspecified: Secondary | ICD-10-CM | POA: Diagnosis not present

## 2024-02-15 DIAGNOSIS — M1611 Unilateral primary osteoarthritis, right hip: Secondary | ICD-10-CM | POA: Diagnosis not present

## 2024-02-15 DIAGNOSIS — I5043 Acute on chronic combined systolic (congestive) and diastolic (congestive) heart failure: Secondary | ICD-10-CM | POA: Diagnosis not present

## 2024-02-15 DIAGNOSIS — J9620 Acute and chronic respiratory failure, unspecified whether with hypoxia or hypercapnia: Secondary | ICD-10-CM | POA: Diagnosis not present

## 2024-02-15 LAB — BASIC METABOLIC PANEL WITH GFR
Anion gap: 10 (ref 5–15)
BUN: 24 mg/dL — ABNORMAL HIGH (ref 8–23)
CO2: 25 mmol/L (ref 22–32)
Calcium: 9 mg/dL (ref 8.9–10.3)
Chloride: 98 mmol/L (ref 98–111)
Creatinine, Ser: 1.83 mg/dL — ABNORMAL HIGH (ref 0.61–1.24)
GFR, Estimated: 41 mL/min — ABNORMAL LOW (ref >60)
Glucose, Bld: 102 mg/dL — ABNORMAL HIGH (ref 70–99)
Potassium: 4.5 mmol/L (ref 3.5–5.1)
Sodium: 133 mmol/L — ABNORMAL LOW (ref 135–145)

## 2024-02-15 LAB — CBC
HCT: 46.2 % (ref 39.0–52.0)
Hemoglobin: 14.7 g/dL (ref 13.0–17.0)
MCH: 29.2 pg (ref 26.0–34.0)
MCHC: 31.8 g/dL (ref 30.0–36.0)
MCV: 91.7 fL (ref 80.0–100.0)
Platelets: 203 K/uL (ref 150–400)
RBC: 5.04 MIL/uL (ref 4.22–5.81)
RDW: 15.2 % (ref 11.5–15.5)
WBC: 18.1 K/uL — ABNORMAL HIGH (ref 4.0–10.5)
nRBC: 0 % (ref 0.0–0.2)

## 2024-02-15 LAB — GLUCOSE, CAPILLARY
Glucose-Capillary: 99 mg/dL (ref 70–99)
Glucose-Capillary: 109 mg/dL — ABNORMAL HIGH (ref 70–99)

## 2024-02-15 MED ORDER — OXYCODONE HCL 5 MG PO TABS: 5.0000 mg | ORAL_TABLET | ORAL | 0 refills | Status: DC | PRN

## 2024-02-15 MED ORDER — TIZANIDINE HCL 4 MG PO TABS: 4.0000 mg | ORAL_TABLET | Freq: Four times a day (QID) | ORAL | 1 refills | Status: DC | PRN

## 2024-02-15 NOTE — Plan of Care (Signed)
   Problem: Education: Goal: Knowledge of General Education information will improve Description: Including pain rating scale, medication(s)/side effects and non-pharmacologic comfort measures Outcome: Completed/Met   Problem: Health Behavior/Discharge Planning: Goal: Ability to manage health-related needs will improve Outcome: Completed/Met   Problem: Clinical Measurements: Goal: Ability to maintain clinical measurements within normal limits will improve Outcome: Completed/Met Goal: Will remain free from infection Outcome: Completed/Met Goal: Diagnostic test results will improve Outcome: Completed/Met Goal: Respiratory complications will improve Outcome: Completed/Met Goal: Cardiovascular complication will be avoided Outcome: Completed/Met   Problem: Activity: Goal: Risk for activity intolerance will decrease Outcome: Completed/Met   Problem: Nutrition: Goal: Adequate nutrition will be maintained Outcome: Completed/Met   Problem: Coping: Goal: Level of anxiety will decrease Outcome: Completed/Met   Problem: Elimination: Goal: Will not experience complications related to bowel motility Outcome: Completed/Met Goal: Will not experience complications related to urinary retention Outcome: Completed/Met   Problem: Pain Managment: Goal: General experience of comfort will improve and/or be controlled Outcome: Completed/Met   Problem: Safety: Goal: Ability to remain free from injury will improve Outcome: Completed/Met   Problem: Skin Integrity: Goal: Risk for impaired skin integrity will decrease Outcome: Completed/Met   Problem: Education: Goal: Knowledge of the prescribed therapeutic regimen will improve Outcome: Completed/Met Goal: Understanding of discharge needs will improve Outcome: Completed/Met Goal: Individualized Educational Video(s) Outcome: Completed/Met   Problem: Activity: Goal: Ability to avoid complications of mobility impairment will  improve Outcome: Completed/Met Goal: Ability to tolerate increased activity will improve Outcome: Completed/Met   Problem: Clinical Measurements: Goal: Postoperative complications will be avoided or minimized Outcome: Completed/Met   Problem: Pain Management: Goal: Pain level will decrease with appropriate interventions Outcome: Completed/Met   Problem: Skin Integrity: Goal: Will show signs of wound healing Outcome: Completed/Met

## 2024-02-15 NOTE — Progress Notes (Signed)
 Subjective: 1 Day Post-Op Procedure(s) (LRB): ARTHROPLASTY, HIP, TOTAL, ANTERIOR APPROACH, RIGHT (Right) Patient reports pain as moderate.    Objective: Vital signs in last 24 hours: Temp:  [97.5 F (36.4 C)-98.1 F (36.7 C)] 97.7 F (36.5 C) (09/03 0729) Pulse Rate:  [65-83] 73 (09/03 0729) Resp:  [17-28] 18 (09/03 0729) BP: (113-161)/(75-118) 129/88 (09/03 0729) SpO2:  [88 %-99 %] 93 % (09/03 0729)  Intake/Output from previous day: 09/02 0701 - 09/03 0700 In: 2410 [P.O.:1560; I.V.:350; IV Piggyback:500] Out: 1575 [Urine:1375; Blood:200] Intake/Output this shift: No intake/output data recorded.  No results for input(s): HGB in the last 72 hours. No results for input(s): WBC, RBC, HCT, PLT in the last 72 hours. No results for input(s): NA, K, CL, CO2, BUN, CREATININE, GLUCOSE, CALCIUM  in the last 72 hours. No results for input(s): LABPT, INR in the last 72 hours.  Sensation intact distally Intact pulses distally Dorsiflexion/Plantar flexion intact Incision: scant drainage   Assessment/Plan: 1 Day Post-Op Procedure(s) (LRB): ARTHROPLASTY, HIP, TOTAL, ANTERIOR APPROACH, RIGHT (Right) Up with therapy Discharge home with home health     Lonni CINDERELLA Poli 02/15/2024, 7:40 AM

## 2024-02-15 NOTE — Progress Notes (Signed)
 Physical Therapy Treatment Patient Details Name: Chris [name] MRN: [id number] DOB: [birth year] Today's Date: 02/15/2024   History of Present Illness 63 y.o. male presents to [facility] hospital on 02/14/2024 for elective R THA. PMH includes HLD, HTN, CKD, COPD, depression, gout, DMII, CHF.    PT Comments  Pt remains sleepy, but able to increase alertness with sitting up on edge of bed. Reviewed HEP and pt remains grossly weak in proximal right lower extremity musculature. Pt requiring min assist for bed mobility, even with use of gait belt strap provided. Increased time to power up to standing position with manual placement of RLE. Pt ambulating household distance with RW and negotiated 5 steps with some difficulty, use of rails, and support by therapist. Have some concerns since pt is discharging home alone. Recommended family member stay with him the first couple of nights as he cannot maneuver out of the bed by himself and doesn't have a recliner to sleep on. Placed OT consult as well.    If plan is discharge home, recommend the following: A little help with walking and/or transfers;A little help with bathing/dressing/bathroom;Assistance with cooking/housework;Assist for transportation;Help with stairs or ramp for entrance   Can travel by private vehicle        Equipment Recommendations  Rolling walker (2 wheels);BSC/3in1    Recommendations for Other Services       Precautions / Restrictions Precautions Precautions: Fall Recall of Precautions/Restrictions: Impaired Precaution/Restrictions Comments: direct anterior THA Restrictions Weight Bearing Restrictions Per Provider Order: Yes RLE Weight Bearing Per Provider Order: Weight bearing as tolerated     Mobility  Bed Mobility Overal bed mobility: Needs Assistance Bed Mobility: Supine to Sit, Sit to Supine     Supine to sit: Min assist Sit to supine: Contact guard assist   General bed mobility comments: Pt states he cannot hook his LLE  underneath RLE to progress to edge of bed, provided with gait belt strap, which pt was able to utilize to maneuver off edge of bed with increased. time. Heavy minA to elevate trunk to upright. Pt able to utilize hooking method and gait belt strap to return RLE to bed without assist    Transfers Overall transfer level: Needs assistance Equipment used: Rolling walker (2 wheels) Transfers: Sit to/from Stand Sit to Stand: Contact guard assist, From elevated surface           General transfer comment: Increased time, manual placement of RLE    Ambulation/Gait Ambulation/Gait assistance: Contact guard assist Gait Distance (Feet): 150 Feet Assistive device: Rolling walker (2 wheels) Gait Pattern/deviations: Step-through pattern, Antalgic Gait velocity: reduced Gait velocity interpretation: <1.31 ft/sec, indicative of household ambulator   General Gait Details: Moderate reliance through arms on walker, good posture, slowed speed requiring 2 standing rest breaks   Stairs Stairs: Yes Stairs assistance: Min assist Stair Management: Two rails, One rail Left, Sideways, Forwards Number of Stairs: 5 General stair comments: Negotiated steps forwards with bilateral rails, cues for sequencing/technique. Pt difficulty with descending, ultimately used one rail with bilateral hand support and went down sideways   Wheelchair Mobility     Tilt Bed    Modified Rankin (Stroke Patients Only)       Balance Overall balance assessment: Needs assistance Sitting-balance support: No upper extremity supported, Feet supported Sitting balance-Leahy Scale: Good     Standing balance support: Single extremity supported, Reliant on assistive device for balance Standing balance-Leahy Scale: Poor  Communication Communication Communication: No apparent difficulties  Cognition Arousal: Alert (intermittent lethargy during history) Behavior During Therapy:  Impulsive   PT - Cognitive impairments: Awareness, Safety/Judgement                         Following commands: Intact      Cueing Cueing Techniques: Verbal cues  Exercises Total Joint Exercises Quad Sets: AROM, Right, 10 reps, Supine Short Arc Quad: AROM, Right, 10 reps, Supine Heel Slides: Right, AAROM, 5 reps, Supine Hip ABduction/ADduction: AAROM, Right, 5 reps, Supine Long Arc Quad: AAROM, Right, 5 reps, Seated    General Comments        Pertinent Vitals/Pain Pain Assessment Pain Assessment: Faces Faces Pain Scale: Hurts even more Pain Location: R hip Pain Descriptors / Indicators: Sore Pain Intervention(s): Monitored during session    Home Living Family/patient expects to be discharged to:: Private residence Living Arrangements: Alone Available Help at Discharge: Family;Friend(s);Available PRN/intermittently (pt seems uncertain about caregiver support) Type of Home: House Home Access: Stairs to enter Entrance Stairs-Rails: Can reach both Entrance Stairs-Number of Steps: 7   Home Layout: One level Home Equipment: Cane - single point      Prior Function            PT Goals (current goals can now be found in the care plan section) Acute Rehab PT Goals Patient Stated Goal: to return to independence PT Goal Formulation: With patient Time For Goal Achievement: 02/18/24 Potential to Achieve Goals: Good Progress towards PT goals: Progressing toward goals    Frequency    7X/week      PT Plan      Co-evaluation              AM-PAC PT 6 Clicks Mobility   Outcome Measure  Help needed turning from your back to your side while in a flat bed without using bedrails?: A Little Help needed moving from lying on your back to sitting on the side of a flat bed without using bedrails?: A Little Help needed moving to and from a bed to a chair (including a wheelchair)?: A Little Help needed standing up from a chair using your arms (e.g.,  wheelchair or bedside chair)?: A Little Help needed to walk in hospital room?: A Little Help needed climbing 3-5 steps with a railing? : A Little 6 Click Score: 18    End of Session Equipment Utilized During Treatment: Gait belt Activity Tolerance: Patient tolerated treatment well Patient left: in bed;with call bell/phone within reach;with bed alarm set Nurse Communication: Mobility status PT Visit Diagnosis: Other abnormalities of gait and mobility (R26.89);Muscle weakness (generalized) (M62.81)     Time: 9155-9084 PT Time Calculation (min) (ACUTE ONLY): 31 min  Charges:    $Gait Training: 8-22 mins $Therapeutic Exercise: 8-22 mins PT General Charges $$ ACUTE PT VISIT: 1 Visit                     Aleck Daring, PT, DPT Acute Rehabilitation Services Office [phone]    Alayne ONEIDA Daring 02/15/2024, 9:22 AM

## 2024-02-15 NOTE — Progress Notes (Signed)
 Physical Therapy Treatment Patient Details Name: Chris [name] MRN: [id number] DOB: [birth year] Today's Date: 02/15/2024   History of Present Illness 63 y.o. male presents to [facility] hospital on 02/14/2024 for elective R THA. PMH includes HLD, HTN, CKD, COPD, depression, gout, DMII, CHF.    PT Comments  Session focused on increasing independence with bed mobility and transfers. Reviewed using gait belt strap to maneuver RLE off edge of bed. Pt still with significant difficulty with these transitions, but ultimately can stand up from elevated bed height without physical assist. He reports he typically sits in a chair with no arms and has no recliner. Discussed potentially using walker to push off from to transfer and he will benefit from [facility] as toilet riser. Once he is on his feet, he does better, walking with step through pattern a limited hallway distance. Recommend HHPT and family to stay with pt initially to facilitate safety and mobility.   If plan is discharge home, recommend the following: A little help with walking and/or transfers;A little help with bathing/dressing/bathroom;Assistance with cooking/housework;Assist for transportation;Help with stairs or ramp for entrance   Can travel by private vehicle        Equipment Recommendations  Rolling walker (2 wheels);BSC/3in1    Recommendations for Other Services       Precautions / Restrictions Precautions Precautions: Fall Recall of Precautions/Restrictions: Impaired Precaution/Restrictions Comments: direct anterior THA Restrictions Weight Bearing Restrictions Per Provider Order: Yes RLE Weight Bearing Per Provider Order: Weight bearing as tolerated     Mobility  Bed Mobility Overal bed mobility: Needs Assistance Bed Mobility: Supine to Sit     Supine to sit: Min assist Sit to supine: Contact guard assist   General bed mobility comments: Use of gait belt strap to maneuver RLE off edge of bed, pt with increased pain with leg in  dependent position, requiring support. Able to sit up trunk with increased time/effort, use of rails    Transfers Overall transfer level: Needs assistance Equipment used: Rolling walker (2 wheels) Transfers: Sit to/from Stand Sit to Stand: Contact guard assist, From elevated surface           General transfer comment: Increased time, cues for hand placement    Ambulation/Gait Ambulation/Gait assistance: Contact guard assist Gait Distance (Feet): 75 Feet Assistive device: Rolling walker (2 wheels) Gait Pattern/deviations: Step-through pattern, Antalgic Gait velocity: reduced Gait velocity interpretation: <1.31 ft/sec, indicative of household ambulator   General Gait Details: Moderate reliance through arms on walker, good posture   Stairs Stairs: Yes Stairs assistance: Min assist Stair Management: Two rails, One rail Left, Sideways, Forwards Number of Stairs: 5 General stair comments: Negotiated steps forwards with bilateral rails, cues for sequencing/technique. Pt difficulty with descending, ultimately used one rail with bilateral hand support and went down sideways   Wheelchair Mobility     Tilt Bed    Modified Rankin (Stroke Patients Only)       Balance Overall balance assessment: Needs assistance Sitting-balance support: No upper extremity supported, Feet supported Sitting balance-Leahy Scale: Good     Standing balance support: Single extremity supported, Reliant on assistive device for balance Standing balance-Leahy Scale: Poor                              Communication Communication Communication: No apparent difficulties  Cognition Arousal: Alert (intermittent lethargy during history) Behavior During Therapy: Impulsive   PT - Cognitive impairments: Awareness, Safety/Judgement  Following commands: Intact      Cueing Cueing Techniques: Verbal cues  Exercises Total Joint Exercises Quad Sets: AROM,  Right, 10 reps, Supine Short Arc Quad: AROM, Right, 10 reps, Supine Heel Slides: Right, AAROM, 5 reps, Supine Hip ABduction/ADduction: AAROM, Right, 5 reps, Supine Long Arc Quad: AAROM, Right, 5 reps, Seated    General Comments        Pertinent Vitals/Pain Pain Assessment Pain Assessment: Faces Faces Pain Scale: Hurts even more Pain Location: R hip Pain Descriptors / Indicators: Sore Pain Intervention(s): Monitored during session    Home Living                          Prior Function            PT Goals (current goals can now be found in the care plan section) Acute Rehab PT Goals Patient Stated Goal: to return to independence PT Goal Formulation: With patient Time For Goal Achievement: 02/18/24 Potential to Achieve Goals: Good Progress towards PT goals: Progressing toward goals    Frequency    7X/week      PT Plan      Co-evaluation              AM-PAC PT 6 Clicks Mobility   Outcome Measure  Help needed turning from your back to your side while in a flat bed without using bedrails?: A Little Help needed moving from lying on your back to sitting on the side of a flat bed without using bedrails?: A Little Help needed moving to and from a bed to a chair (including a wheelchair)?: A Little Help needed standing up from a chair using your arms (e.g., wheelchair or bedside chair)?: A Little Help needed to walk in hospital room?: A Little Help needed climbing 3-5 steps with a railing? : A Little 6 Click Score: 18    End of Session Equipment Utilized During Treatment: Gait belt Activity Tolerance: Patient tolerated treatment well Patient left: with call bell/phone within reach;in chair Nurse Communication: Mobility status PT Visit Diagnosis: Other abnormalities of gait and mobility (R26.89);Muscle weakness (generalized) (M62.81)     Time: 8742-8684 PT Time Calculation (min) (ACUTE ONLY): 18 min  Charges:    $Therapeutic Activity: 8-22  mins PT General Charges $$ ACUTE PT VISIT: 1 Visit                     Aleck [name], PT, DPT Acute Rehabilitation Services Office [phone]    Chris [name] 02/15/2024, 2:48 PM

## 2024-02-15 NOTE — Discharge Summary (Signed)
 Patient ID: Chris [name] MRN: [id number] DOB/AGE: 11/12/1960 63 y.o.  Admit date: 02/14/2024 Discharge date: 02/15/2024  Admission Diagnoses:  Principal Problem:   Unilateral primary osteoarthritis, right hip Active Problems:   Status post total replacement of right hip   Discharge Diagnoses:  Same  Past Medical History:  Diagnosis Date   Arrhythmia    atrial flutter   CHF (congestive heart failure) (HCC)    Chronic kidney disease    COPD (chronic obstructive pulmonary disease) (HCC)    Coronary artery disease    Depression    Diabetes mellitus without complication (HCC)    GERD (gastroesophageal reflux disease)    Gout    Hypertension    Influenza A with respiratory manifestations    Mental disorder     Surgeries: Procedure(s): ARTHROPLASTY, HIP, TOTAL, ANTERIOR APPROACH, RIGHT on 02/14/2024   Consultants:   Discharged Condition: Improved  Hospital Course: Chris [name] is an 63 y.o. male who was admitted 02/14/2024 for operative treatment ofUnilateral primary osteoarthritis, right hip. Patient has severe unremitting pain that affects sleep, daily activities, and work/hobbies. After pre-op clearance the patient was taken to the operating room on 02/14/2024 and underwent  Procedure(s): ARTHROPLASTY, HIP, TOTAL, ANTERIOR APPROACH, RIGHT.    Patient was given perioperative antibiotics:  Anti-infectives (From admission, onward)    Start     Dose/Rate Route Frequency Ordered Stop   02/14/24 1400  ceFAZolin  (ANCEF ) IVPB 2g/100 mL premix        2 g 200 mL/hr over 30 Minutes Intravenous Every 6 hours 02/14/24 1016 02/15/24 0829   02/14/24 0600  ceFAZolin  (ANCEF ) IVPB 3g/150 mL premix        3 g 300 mL/hr over 30 Minutes Intravenous On call to O.R. 02/14/24 9452 02/14/24 9187        Patient was given sequential compression devices, early ambulation, and chemoprophylaxis to prevent DVT.  Inpatient Morphine  Milligram Equivalents Per Day 9/2 - 9/3   Values displayed are in  units of MME/Day    Order Start / End Date Yesterday Today    HYDROmorphone  (DILAUDID ) injection 0.25-0.5 mg 9/2 - 9/2 40 of 40-80 --    fentaNYL  citrate (PF) (SUBLIMAZE ) injection 9/2 - 9/2 *15 of 15 --    Daily Totals  * 55 of 55-95 --  *One-Step medication     Patient benefited maximally from hospital stay and there were no complications.    Recent vital signs: Patient Vitals for the past 24 hrs:  BP Temp Temp src Pulse Resp SpO2  02/15/24 0729 129/88 97.7 F (36.5 C) Oral 73 18 93 %  02/15/24 0546 (!) 152/104 97.8 F (36.6 C) Oral 76 (!) 24 99 %  02/15/24 0245 -- -- -- -- (!) 28 --  02/15/24 0004 (!) 134/95 98 F (36.7 C) Oral 67 (!) 28 96 %  02/14/24 2232 -- -- -- -- -- 92 %  02/14/24 2204 -- -- -- 72 -- (!) 88 %  02/14/24 2006 (!) 142/109 98.1 F (36.7 C) Oral 83 20 94 %     Recent laboratory studies:  Recent Labs    02/15/24 0657  WBC 18.1*  HGB 14.7  HCT 46.2  PLT 203  NA 133*  K 4.5  CL 98  CO2 25  BUN 24*  CREATININE 1.83*  GLUCOSE 102*  CALCIUM  9.0     Discharge Medications:   Allergies as of 02/15/2024   No Known Allergies      Medication List  TAKE these medications    albuterol  108 (90 Base) MCG/ACT inhaler Commonly known as: Ventolin  HFA Inhale 2 puffs into the lungs every 6 (six) hours as needed for wheezing or shortness of breath.   albuterol  (2.5 MG/3ML) 0.083% nebulizer solution Commonly known as: PROVENTIL  Take 3 mLs (2.5 mg total) by nebulization every 4 (four) hours as needed for wheezing or shortness of breath.   allopurinol  100 MG tablet Commonly known as: ZYLOPRIM  TAKE 2 TABLETS (200 MG TOTAL) BY MOUTH DAILY.   atorvastatin  80 MG tablet Commonly known as: LIPITOR Take 1 tablet (80 mg total) by mouth daily. What changed: when to take this   carvedilol  12.5 MG tablet Commonly known as: COREG  Take 1.5 tablets (18.75 mg total) by mouth 2 (two) times daily with a meal.   colchicine  0.6 MG tablet Take 0.5 tablets (0.3  mg total) by mouth three times a week. What changed:  how much to take how to take this when to take this reasons to take this additional instructions   dapagliflozin  propanediol 10 MG Tabs tablet Commonly known as: Farxiga  Take 1 tablet (10 mg total) by mouth daily.   DULoxetine  60 MG capsule Commonly known as: Cymbalta  Take 1 capsule (60 mg total) by mouth daily. For pain   Eliquis  5 MG Tabs tablet Generic drug: apixaban  TAKE 1 TABLET BY MOUTH 2 (TWO) TIMES DAILY (AM+PM)   fluticasone -salmeterol 230-21 MCG/ACT inhaler Commonly known as: Advair HFA Inhale 2 puffs into the lungs 2 (two) times daily.   isosorbide -hydrALAZINE  20-37.5 MG tablet Commonly known as: BiDil  Take 1 tablet by mouth 3 (three) times daily.   mirtazapine  15 MG tablet Commonly known as: Remeron  Take 0.5 tablets (7.5 mg total) by mouth at bedtime.   Muscle Rub 10-15 % Crea Apply 1 Application topically as needed.   oxyCODONE  5 MG immediate release tablet Commonly known as: Oxy IR/ROXICODONE  Take 1-2 tablets (5-10 mg total) by mouth every 4 (four) hours as needed for moderate pain (pain score 4-6) (pain score 4-6).   potassium chloride  10 MEQ tablet Commonly known as: KLOR-CON  Take 2 tablets (20 mEq total) by mouth daily.   Spiriva  Respimat 2.5 MCG/ACT Aers Generic drug: Tiotropium Bromide  Monohydrate Inhale 2 puffs into the lungs daily.   tiZANidine  4 MG tablet Commonly known as: Zanaflex  Take 1 tablet (4 mg total) by mouth every 6 (six) hours as needed for muscle spasms.   torsemide  20 MG tablet Commonly known as: DEMADEX  Take 20-40 mg by mouth See admin instructions. Take one tablet (20 mg) by mouth on Monday, Wednesday, Friday, Sunday. Take two tablets (40 mg) by mouth on Tuesday, Thursday and Saturday.               Durable Medical Equipment  (From admission, onward)           Start     Ordered   02/14/24 1017  DME 3 n 1  Once        02/14/24 1016   02/14/24 1017  DME  Walker rolling  Once       Question Answer Comment  Walker: With 5 Inch Wheels   Patient needs a walker to treat with the following condition Status post total replacement of right hip      09 /02/25 1016            Diagnostic Studies: DG Pelvis Portable Result Date: 02/14/2024 CLINICAL DATA:  Status post right hip replacement. EXAM: PORTABLE PELVIS 1-2 VIEWS COMPARISON:  Radiograph 01/24/2024  FINDINGS: Right hip arthroplasty in expected alignment. No periprosthetic lucency or fracture. Recent postsurgical change includes air and edema in the soft tissues. Overlying skin staples in place. IMPRESSION: Right hip arthroplasty without immediate postoperative complication. Electronically Signed   By: Andrea Gasman M.D.   On: 02/14/2024 11:34   DG HIP UNILAT WITH PELVIS 1V RIGHT Result Date: 02/14/2024 CLINICAL DATA:  Elective surgery. EXAM: DG HIP (WITH OR WITHOUT PELVIS) 1V RIGHT COMPARISON:  None Available. FINDINGS: Five fluoroscopic spot views of the pelvis and right hip obtained in the operating room. Sequential images during hip arthroplasty. Fluoroscopy time 23 seconds. Dose 3.33 mGy. IMPRESSION: Intraoperative fluoroscopy during right hip arthroplasty. Electronically Signed   By: Andrea Gasman M.D.   On: 02/14/2024 09:15   DG C-Arm 1-60 Min-No Report Result Date: 02/14/2024 Fluoroscopy was utilized by the requesting physician.  No radiographic interpretation.   DG C-Arm 1-60 Min-No Report Result Date: 02/14/2024 Fluoroscopy was utilized by the requesting physician.  No radiographic interpretation.   DG Hip Unilat W or Wo Pelvis 2-3 Views Right Result Date: 01/24/2024 CLINICAL DATA:  Worsening right hip pain.  No recent injury. EXAM: DG HIP (WITH OR WITHOUT PELVIS) 2-3V RIGHT COMPARISON:  12/12/2023 FINDINGS: Moderate superior right hip joint space narrowing without significant change. Stable mild right femoral head and neck junction spur formation. There is interval sclerosis and region  of the right superior acetabulum and femoral head as well as sclerosis involving to ossicles at the lateral aspect of the hip joint. Lower thoracic spine degenerative changes. Unremarkable left hip. IMPRESSION: 1. Interval sclerosis involving the right superior acetabulum and possibly the femoral head as well as sclerosis involving 2 ossicles at the lateral aspect of the hip joint. This could be all related to progressive degenerative changes or a combination of degenerative changes and avascular necrosis of the femoral head. It is difficult to determine if the femoral head is involved due to bony overlap with the acetabulum. If clinically indicated, this could be better differentiated with pelvis CT without contrast. 2. Stable moderate right hip degenerative changes. Electronically Signed   By: Elspeth Bathe M.D.   On: 01/24/2024 20:30   DG Chest 2 View Result Date: 01/16/2024 CLINICAL DATA:  Shortness of breath.  Cough. EXAM: CHEST - 2 VIEW COMPARISON:  Radiograph 08/18/2023, CT 07/15/2023 FINDINGS: The heart is enlarged but stable. Mediastinal contours are unchanged. Aortic atherosclerosis. Chronic bronchial thickening. Questionable patchy opacity at the left lung base. No pneumothorax or pleural effusion. Home limited assessment, no acute osseous findings. IMPRESSION: 1. Questionable patchy opacity at the left lung base, atelectasis versus pneumonia. 2. Stable cardiomegaly. 3. Chronic bronchial thickening. Electronically Signed   By: Andrea Gasman M.D.   On: 01/16/2024 23:34    Disposition: Discharge disposition: 01-Home or Self Care          Follow-up Information     Vernetta Lonni GRADE, MD Follow up in 2 week(s).   Specialty: Orthopedic Surgery Contact information: [address] [place]  [place] [address] [phone]         [facility] Follow up.   Specialty: Home Health Services Why: [facility] will contact you for the first home visit Contact information: [address] [place] [address] [phone]                  Signed: Lonni GRADE Vernetta 02/15/2024, 4:05 PM

## 2024-02-15 NOTE — Progress Notes (Signed)
 OT Cancellation Note  Patient Details Name: Chris [name] MRN: [id number] DOB: September 10, 1960   Cancelled Treatment:    Reason Eval/Treat Not Completed: Pain limiting ability to participate (Pt declined OT eval due to being too sore from walking; education and encouragement provided. Pt continued to decline. OT to follow up later today as able.)  Zenovia Justman D Causey 02/15/2024, 9:51 AM

## 2024-02-15 NOTE — Evaluation (Signed)
 Occupational Therapy Evaluation Patient Details Name: Chris [name] MRN: [id number] DOB: 1960-10-22 Today's Date: 02/15/2024   History of Present Illness   63 y.o. male presents to [facility] hospital on 02/14/2024 for elective R THA. PMH includes HLD, HTN, CKD, COPD, depression, gout, DMII, CHF.     Clinical Impressions Patient is s/p R THA direct anterior surgery resulting in functional limitations due to the deficits listed below (see OT problem list). Pt at baseline indep and lives alone. Pt will have A from his brother at night at home and will order food for delivery. Pt reports feeling very anxious about his current situation and trying to calm myself down. Pt at times appears to be asleep but does respond when spoken to. Pt after dressing education reports feeling better about going home and will have his brother.  Patient will benefit from skilled OT acutely to increase independence and safety with ADLS to allow discharge HHOT/ RW/ BSC/ urinal .      If plan is discharge home, recommend the following:   A little help with walking and/or transfers;A little help with bathing/dressing/bathroom     Functional Status Assessment   Patient has had a recent decline in their functional status and demonstrates the ability to make significant improvements in function in a reasonable and predictable amount of time.     Equipment Recommendations   BSC/3in1;Other (comment) (RW urinal;)     Recommendations for Other Services         Precautions/Restrictions   Precautions Precautions: Fall Recall of Precautions/Restrictions: Impaired Precaution/Restrictions Comments: direct anterior THA Restrictions Weight Bearing Restrictions Per Provider Order: Yes RLE Weight Bearing Per Provider Order: Weight bearing as tolerated     Mobility Bed Mobility               General bed mobility comments: oob in chair on arrival    Transfers Overall transfer level: Needs  assistance Equipment used: Rolling walker (2 wheels) Transfers: Sit to/from Stand Sit to Stand: Contact guard assist           General transfer comment: pt with increased time but able to power up from standard chair height.      Balance Overall balance assessment: Needs assistance Sitting-balance support: No upper extremity supported, Feet supported Sitting balance-Leahy Scale: Good     Standing balance support: Single extremity supported, Reliant on assistive device for balance Standing balance-Leahy Scale: Poor                             ADL either performed or assessed with clinical judgement   ADL Overall ADL's : Needs assistance/impaired Eating/Feeding: Independent   Grooming: Independent           Upper Body Dressing : Supervision/safety;Sitting   Lower Body Dressing: Supervision/safety;With adaptive equipment;Sit to/from stand Lower Body Dressing Details (indicate cue type and reason): pt able to dress R LE first and don underwear and pj pants. educated on reacher adn sock aid                     Vision Baseline Vision/History: 0 No visual deficits Patient Visual Report: No change from baseline       Perception         Praxis         Pertinent Vitals/Pain Pain Assessment Pain Assessment: Faces Faces Pain Scale: Hurts even more Pain Location: R hip Pain Descriptors / Indicators: Sore Pain Intervention(s): Monitored  during session, Premedicated before session, Repositioned, Limited activity within patient's tolerance     Extremity/Trunk Assessment Upper Extremity Assessment Upper Extremity Assessment: Overall WFL for tasks assessed   Lower Extremity Assessment Lower Extremity Assessment: Defer to PT evaluation RLE Deficits / Details: generalized post-op weakness   Cervical / Trunk Assessment Cervical / Trunk Assessment: Normal   Communication Communication Communication: No apparent difficulties   Cognition Arousal:  Alert (intermittent lethargy during history) Behavior During Therapy: Impulsive Cognition: No apparent impairments             OT - Cognition Comments: pt closing eyes and appears to be fatigued. pt appropriate requesting OT speak with brother on phone to address pending needs. brother communicated via phone willness to stay with patient upon d/c                 Following commands: Intact       Cueing  General Comments   Cueing Techniques: Verbal cues  incision with dressing dry and intact. pt on RA. pt needs redirection for arousal x3 during session.   Exercises Other Exercises Other Exercises: phone call to brother to verbalize pt need for A upon d/c.   Shoulder Instructions      Home Living Family/patient expects to be discharged to:: Private residence Living Arrangements: Alone Available Help at Discharge: Family;Friend(s);Available PRN/intermittently (pt seems uncertain about caregiver support) Type of Home: House Home Access: Stairs to enter [employer] of Steps: 7 Entrance Stairs-Rails: Can reach both Home Layout: One level     Bathroom Shower/Tub: Chief Strategy Officer: Standard     Home Equipment: Cane - single point          Prior Functioning/Environment Prior Level of Function : Independent/Modified Independent;Driving             Mobility Comments: ambulatory with SPC      OT Problem List: Impaired balance (sitting and/or standing);Decreased activity tolerance;Decreased safety awareness;Decreased knowledge of use of DME or AE;Decreased knowledge of precautions   OT Treatment/Interventions: Self-care/ADL training;Therapeutic exercise;Energy conservation;DME and/or AE instruction;Therapeutic activities;Patient/family education;Balance training      OT Goals(Current goals can be found in the care plan section)   Acute Rehab OT Goals Patient Stated Goal: to go home amd sleep OT Goal Formulation: With  patient Time For Goal Achievement: 02/29/24 Potential to Achieve Goals: Good   OT Frequency:  Min 2X/week    Co-evaluation              AM-PAC OT 6 Clicks Daily Activity     Outcome Measure Help from another person eating meals?: None Help from another person taking care of personal grooming?: None Help from another person toileting, which includes using toliet, bedpan, or urinal?: A Lot Help from another person bathing (including washing, rinsing, drying)?: A Lot Help from another person to put on and taking off regular upper body clothing?: A Little Help from another person to put on and taking off regular lower body clothing?: A Lot 6 Click Score: 17   End of Session Equipment Utilized During Treatment: Rolling walker (2 wheels) Nurse Communication: Mobility status;Precautions  Activity Tolerance: Patient tolerated treatment well Patient left: in chair;with call bell/phone within reach;with chair alarm set  OT Visit Diagnosis: Unsteadiness on feet (R26.81);Muscle weakness (generalized) (M62.81)                Time: 1335-1400 OT Time Calculation (min): 25 min Charges:  OT General Charges $OT Visit: 1 Visit OT Evaluation $  OT Eval Moderate Complexity: 1 Mod OT Treatments $Self Care/Home Management : 8-22 mins   Brynn, OTR/L  Acute Rehabilitation Services Office: [phone] .   Ely Molt 02/15/2024, 2:56 PM

## 2024-02-15 NOTE — Progress Notes (Signed)
 Patient awaiting family for discharge home, Patient in no acute distress, complaints of pain and discomfort and pain medication given prior to discharge; incision on hip is clean, dry and intact. Room was checked and accounted for all patient's belongings; discharge instructions concerning his medications, incision care, follow up appointment and when to call the doctor as needed were all discussed with patient by RN and he expressed understanding on the instructions given. RW and BSC given to patient for home usage.

## 2024-02-16 ENCOUNTER — Telehealth: Payer: Self-pay

## 2024-02-16 DIAGNOSIS — Z471 Aftercare following joint replacement surgery: Secondary | ICD-10-CM | POA: Diagnosis not present

## 2024-02-16 NOTE — Transitions of Care (Post Inpatient/ED Visit) (Signed)
   02/16/2024  Name: KENROY TIMBERMAN MRN: [id number] DOB: [birth year]  Today's TOC FU Call Status: Today's TOC FU Call Status:: Unsuccessful Call (1st Attempt) Unsuccessful Call (1st Attempt) Date: 02/16/24  Attempted to reach the patient regarding the most recent Inpatient/ED visit.  Follow Up Plan: Additional outreach attempts will be made to reach the patient to complete the Transitions of Care (Post Inpatient/ED visit) call.   Signature  Slater Diesel, RN

## 2024-02-18 DIAGNOSIS — Z471 Aftercare following joint replacement surgery: Secondary | ICD-10-CM | POA: Diagnosis not present

## 2024-02-20 ENCOUNTER — Other Ambulatory Visit: Payer: Self-pay | Admitting: Orthopaedic Surgery

## 2024-02-20 ENCOUNTER — Telehealth: Payer: Self-pay

## 2024-02-20 ENCOUNTER — Encounter: Payer: Self-pay | Admitting: Radiology

## 2024-02-20 ENCOUNTER — Ambulatory Visit: Admitting: Surgical

## 2024-02-20 MED ORDER — OXYCODONE HCL 5 MG PO TABS: 5.0000 mg | ORAL_TABLET | ORAL | 0 refills | Status: DC | PRN

## 2024-02-20 NOTE — Telephone Encounter (Signed)
 Patient needs refill of pain medication   THA 02/14/24 [facility]

## 2024-02-20 NOTE — Transitions of Care (Post Inpatient/ED Visit) (Signed)
   02/20/2024  Name: Chris [name] MRN: [id number] DOB: [birth year]  Today's TOC FU Call Status: Today's TOC FU Call Status:: Successful TOC FU Call Completed Unsuccessful Call (1st Attempt) Date: 02/16/24 [facility] FU Call Complete Date: 02/20/24 Patient's Name and Date of Birth confirmed.  Transition Care Management Follow-up Telephone Call Date of Discharge: 02/15/24 Discharge Facility: [facility]) Type of Discharge: Inpatient Admission Primary Inpatient Discharge Diagnosis:: s/p right THR How have you been since you were released from the hospital?: Same (He said it has been rough) Any questions or concerns?: No  Items Reviewed: Did you receive and understand the discharge instructions provided?: Yes Medications obtained,verified, and reconciled?: No Medications Not Reviewed Reasons:: Other: (He was getting in the car for his brother to take him to get something to eat. He said he would not know the meds he has if I read the list to him. I told him to call this office with any questions after he reviews the list again. He has a nebulizer) Any new allergies since your discharge?: No Dietary orders reviewed?: Yes Type of Diet Ordered:: heart healthy, low sodium Do you have support at home?: Yes People in Home [RPT]: alone Name of Support/Comfort Primary Source: He said his brother can provide some assistance when needed  Medications Reviewed Today: Medications Reviewed Today   Medications were not reviewed in this encounter     Home Care and Equipment/Supplies: Were Home Health Services Ordered?: Yes Name of Home Health Agency:: [facility] Has Agency set up a time to come to your home?: No EMR reviewed for Home Health Orders: Orders present/patient has not received call (refer to CM for follow-up) (I spoke to [employer] and she said his start of care was supposed to be 9/4. I told her the patient said he has not heard from anyone and she said she will ask the team that is to  see him to please contact him. He is also schduled for a visit today.) Any new equipment or medical supplies ordered?: Yes Name of Medical supply agency?: he received a RW and BSC from the hospital Were you able to get the equipment/medical supplies?: Yes Do you have any questions related to the use of the equipment/supplies?: No  Functional Questionnaire: Do you need assistance with bathing/showering or dressing?: Yes Do you need assistance with meal preparation?: Yes Do you need assistance with eating?: No Do you have difficulty maintaining continence: No Do you need assistance with getting out of bed/getting out of a chair/moving?: Yes (has RW to use with ambulation) Do you have difficulty managing or taking your medications?: No  Follow up appointments reviewed: PCP Follow-up appointment confirmed?: Yes Date of PCP follow-up appointment?: 03/20/24 Follow-up Provider: [facility] Follow-up appointment confirmed?: Yes Date of Specialist follow-up appointment?: 02/27/24 Follow-Up Specialty Provider:: orthopedic surgery.  03/12/2024- cardiology Do you need transportation to your follow-up appointment?: No Do you understand care options if your condition(s) worsen?: Yes-patient verbalized understanding  I called the patient back after speaking with Vicky/ [facility] and he then said that someone did come out to see him on Saturday.  I told him that he is scheduled for a visit today and [facility] will be calling him and he said he understood.     SIGNATURE Slater Diesel, RN

## 2024-02-21 ENCOUNTER — Telehealth: Payer: Self-pay

## 2024-02-21 ENCOUNTER — Telehealth: Payer: Self-pay | Admitting: Orthopaedic Surgery

## 2024-02-21 ENCOUNTER — Other Ambulatory Visit: Payer: Self-pay | Admitting: Orthopaedic Surgery

## 2024-02-21 MED ORDER — TIZANIDINE HCL 4 MG PO TABS: 4.0000 mg | ORAL_TABLET | Freq: Four times a day (QID) | ORAL | 1 refills | Status: DC | PRN

## 2024-02-21 NOTE — Telephone Encounter (Signed)
 FYI - Called to let patient know muscle relaxer has been sent to pharmacy. Patient asked about cleaning wound and dressing change. Advised patient to keep bandage on until first post op visit. State he already removed bandaged. Advised patient to keep dry and to place a dry dressing over incision.

## 2024-02-21 NOTE — Telephone Encounter (Signed)
 Patient called and left a message on triage stating that he would like a call back.  Did not state what he needed.  CB# [phone].  Please advise.

## 2024-02-21 NOTE — Telephone Encounter (Signed)
 Pt called requesting refill of muscle relaxer. Please send to pharmacy on file. Pt phone number is [phone].

## 2024-02-22 ENCOUNTER — Other Ambulatory Visit: Payer: Self-pay | Admitting: Internal Medicine

## 2024-02-23 NOTE — Telephone Encounter (Signed)
 Requested Prescriptions  Pending Prescriptions Disp Refills   colchicine  0.6 MG tablet [Pharmacy Med Name: COLCHICINE  0.6 MG ORAL TABLET] 15 tablet 2    Sig: TAKE HALF TABLETS (0.3 MG TOTAL) BY MOUTH THREE TIMES A WEEK.     Endocrinology:  Gout Agents - colchicine  Failed - 02/23/2024  2:19 PM      Failed - Cr in normal range and within 360 days    Creat  Date Value Ref Range Status  01/14/2020 1.97 (H) 0.70 - 1.33 mg/dL Final    Comment:    For patients >25 years of age, the reference limit for Creatinine is approximately 13% higher for people identified as African-American. .    Creatinine, Ser  Date Value Ref Range Status  02/15/2024 1.83 (H) 0.61 - 1.24 mg/dL Final         Failed - CBC within normal limits and completed in the last 12 months    WBC  Date Value Ref Range Status  02/15/2024 18.1 (H) 4.0 - 10.5 K/uL Final   RBC  Date Value Ref Range Status  02/15/2024 5.04 4.22 - 5.81 MIL/uL Final   Hemoglobin  Date Value Ref Range Status  02/15/2024 14.7 13.0 - 17.0 g/dL Final  94/73/7978 84.5 13.0 - 17.7 g/dL Final   HCT  Date Value Ref Range Status  02/15/2024 46.2 39.0 - 52.0 % Final   Hematocrit  Date Value Ref Range Status  11/07/2019 46.2 37.5 - 51.0 % Final   MCHC  Date Value Ref Range Status  02/15/2024 31.8 30.0 - 36.0 g/dL Final   [facility]  Date Value Ref Range Status  02/15/2024 29.2 26.0 - 34.0 pg Final   MCV  Date Value Ref Range Status  02/15/2024 91.7 80.0 - 100.0 fL Final  11/07/2019 90 79 - 97 fL Final  10/21/2013 92 80 - 100 fL Final   No results found for: PLTCOUNTKUC, LABPLAT, POCPLA RDW  Date Value Ref Range Status  02/15/2024 15.2 11.5 - 15.5 % Final  11/07/2019 14.2 11.6 - 15.4 % Final  10/21/2013 15.7 (H) 11.5 - 14.5 % Final         Passed - ALT in normal range and within 360 days    ALT  Date Value Ref Range Status  01/16/2024 19 0 - 44 U/L Final   SGPT (ALT)  Date Value Ref Range Status  10/20/2013 69 12 - 78 U/L  Final         Passed - AST in normal range and within 360 days    AST  Date Value Ref Range Status  01/16/2024 18 15 - 41 U/L Final   SGOT(AST)  Date Value Ref Range Status  10/20/2013 34 15 - 37 Unit/L Final         Passed - Valid encounter within last 12 months    Recent Outpatient Visits           3 weeks ago Chronic bronchitis, unspecified chronic bronchitis type (HCC)   [facility] [place]. [facility] Delbert Clam, MD   1 month ago Patient left before evaluation by physician   [facility] - [facility] Dept Of [place]. [facility] Vicci Sober B, MD   7 months ago Type 2 diabetes mellitus with obesity [facility])   [facility] Of [place]. [facility] Vicci Sober NOVAK, MD   1 year ago Chronic  systolic heart failure [facility])   [facility], Jon HERO, PA-C   1 year ago Type 2 diabetes mellitus with obesity [facility])    [facility] Of [place]. [facility] Vicci Barnie NOVAK, MD       Future Appointments             In 2 weeks Agbor-Etang, Redell, MD [facility] [place]   In 3 weeks Vicci Barnie NOVAK, MD [facility] - A Dept Of [facility]. [facility] [place]

## 2024-02-24 DIAGNOSIS — Z471 Aftercare following joint replacement surgery: Secondary | ICD-10-CM | POA: Diagnosis not present

## 2024-02-25 DIAGNOSIS — Z471 Aftercare following joint replacement surgery: Secondary | ICD-10-CM | POA: Diagnosis not present

## 2024-02-27 ENCOUNTER — Encounter: Payer: Self-pay | Admitting: Physician Assistant

## 2024-02-27 ENCOUNTER — Ambulatory Visit (INDEPENDENT_AMBULATORY_CARE_PROVIDER_SITE_OTHER): Admitting: Physician Assistant

## 2024-02-27 DIAGNOSIS — Z96641 Presence of right artificial hip joint: Secondary | ICD-10-CM

## 2024-02-27 MED ORDER — OXYCODONE HCL 5 MG PO TABS: 5.0000 mg | ORAL_TABLET | ORAL | 0 refills | Status: DC | PRN

## 2024-02-27 MED ORDER — TIZANIDINE HCL 4 MG PO TABS: 4.0000 mg | ORAL_TABLET | Freq: Four times a day (QID) | ORAL | 1 refills | Status: DC | PRN

## 2024-02-27 NOTE — Progress Notes (Signed)
 HPI: Mr. Stiff returns today status post right total hip arthroplasty 02/14/2024.  He states that his hip pain is 7 out of 10 at worst.  He is asking for refill on his muscle relaxant and pain medicine.  He is on chronic Eliquis .  Send no fevers chills.  He is ambulating with a walker.  Review of systems: See HPI otherwise negative  Physical exam: General Well-developed well-nourished male no acute distress mood and affect appropriate. Right hip: Fluid limited internal/external rotation.  Surgical incisions healing well no signs of wound dehiscence Staples well-approximated incision today distal incision has staples were removed and wound started dehiscence slightly and therefore Steri-Strips were applied the remainder of his staples were retained.  Right calf supple nontender dorsiflexion plantarflexion ankle intact.  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  Refill on muscle relaxant pain medicine was given.  Follow-up with us  next Monday for removal of remaining staples.  Department sooner if there is any signs of infection.  They have given gauze to place under his pannus to help with moisture.  Questions were encouraged and answered

## 2024-03-05 ENCOUNTER — Other Ambulatory Visit: Payer: Self-pay | Admitting: Orthopaedic Surgery

## 2024-03-05 ENCOUNTER — Telehealth: Payer: Self-pay | Admitting: Orthopedic Surgery

## 2024-03-05 MED ORDER — OXYCODONE HCL 5 MG PO TABS: 5.0000 mg | ORAL_TABLET | Freq: Four times a day (QID) | ORAL | 0 refills | Status: DC | PRN

## 2024-03-05 NOTE — Telephone Encounter (Signed)
 Chris [name] is s/p THA and would like to have a refill of his pain medication.  He uses [facility] in [place].  Call back # is [phone].

## 2024-03-07 ENCOUNTER — Encounter: Payer: Self-pay | Admitting: Physician Assistant

## 2024-03-07 ENCOUNTER — Ambulatory Visit: Admitting: Physician Assistant

## 2024-03-07 DIAGNOSIS — Z96641 Presence of right artificial hip joint: Secondary | ICD-10-CM

## 2024-03-07 MED ORDER — OXYCODONE HCL 5 MG PO TABS: 5.0000 mg | ORAL_TABLET | Freq: Four times a day (QID) | ORAL | 0 refills | Status: DC | PRN

## 2024-03-07 MED ORDER — TIZANIDINE HCL 4 MG PO TABS: 4.0000 mg | ORAL_TABLET | Freq: Four times a day (QID) | ORAL | 1 refills | Status: DC | PRN

## 2024-03-07 NOTE — Progress Notes (Signed)
 HPI: Chris [name] returns today follow-up of his right total hip arthroplasty.  He states his range of motion and strength are improving but he still have a lot of pain.  He is ambulating with a cane.  No fevers chills.  He is asking for refills on his pain medicine and muscle relaxant.  He is on chronic Eliquis .  Review of systems: See HPI otherwise negative  Physical exam: General Well-developed well-nourished male in no acute distress ambulating with a cane.  Able to get on and off the exam table on his own. Right hip surgical incisions healing well no signs of infection.  Proximal incision is well-approximated with staples.  Lower incision completely healed.  Right calf supple nontender dorsiflexion plantarflexion right ankle intact.  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  He will follow-up with us  in 1 month sooner if there is any questions concerns.  Questions were encouraged and answered at length.

## 2024-03-12 ENCOUNTER — Ambulatory Visit: Attending: Cardiology | Admitting: Cardiology

## 2024-03-12 ENCOUNTER — Encounter: Payer: Self-pay | Admitting: Cardiology

## 2024-03-12 VITALS — BP 122/76 | HR 81 | Ht 72.0 in | Wt 262.6 lb

## 2024-03-12 DIAGNOSIS — I10 Essential (primary) hypertension: Secondary | ICD-10-CM | POA: Diagnosis not present

## 2024-03-12 DIAGNOSIS — I2510 Atherosclerotic heart disease of native coronary artery without angina pectoris: Secondary | ICD-10-CM | POA: Diagnosis not present

## 2024-03-12 DIAGNOSIS — I4892 Unspecified atrial flutter: Secondary | ICD-10-CM | POA: Diagnosis not present

## 2024-03-12 DIAGNOSIS — I428 Other cardiomyopathies: Secondary | ICD-10-CM | POA: Diagnosis not present

## 2024-03-12 DIAGNOSIS — I422 Other hypertrophic cardiomyopathy: Secondary | ICD-10-CM | POA: Diagnosis not present

## 2024-03-12 NOTE — Patient Instructions (Signed)

## 2024-03-12 NOTE — Progress Notes (Signed)
 Cardiology Office Note:    Date:  03/12/2024   ID:  Chris [name], DOB [birth year], MRN [id number]  PCP:  Chris Barnie NOVAK, MD  [name] Cardiologist:   Chris [name] Electrophysiologist:  Chris FORBES Furbish, MD   Referring MD: Chris Barnie NOVAK, MD   Chief Complaint  Patient presents with   Follow-up    7 month follow up visit. Patient is doing well on today. Meds reviewed.     History of Present Illness:    Chris [name] is a 63 y.o. male with a hx of hypertension, nonobstructive CAD (65% RCA- LHC 6/24), HFrEF (initial EF 25%, normalized to 50%) atrial flutter s/p DCCV 06/2020, COPD, former cigarette smoker x40+ years, former cocaine use x25+ years, HCM, CKD who presents for follow-up.    Doing okay, quit smoking about 3 to 4 months ago.  Occasionally smokes marijuana.  Had right hip surgery about 3 weeks ago, has some discomfort in the area but otherwise doing well.  Patient has shortness of breath when he overexerts himself.  Compliant with medications as prescribed.  Has no bleeding issues with Eliquis .   Prior notes Right heart cath 11/2463% proximal RCA. CMR asymmetric septal hypertrophy consistent with HCM. Lexiscan  Myoview  07/2021 fixed inferior inferolateral defect, no significant coronary calcifications, no significant ischemia. Suddenly admitted for shortness of breath, found to be in atrial flutter with RVR.  Family history of congestive heart failure, unsure if this is HCM. Underwent a TEE guided DC cardioversion 06/2020 .  Echocardiogram obtained 06/2020 while in the hospital showed EF of 25%.  left heart cath 2005 at [facility] years ago with no obstructive disease.  Past Medical History:  Diagnosis Date   Arrhythmia    atrial flutter   CHF (congestive heart failure) (HCC)    Chronic kidney disease    COPD (chronic obstructive pulmonary disease) (HCC)    Coronary artery disease    Depression    Diabetes mellitus without complication (HCC)    GERD  (gastroesophageal reflux disease)    Gout    Hypertension    Influenza A with respiratory manifestations    Mental disorder     Past Surgical History:  Procedure Laterality Date   ANKLE SURGERY Left    CARDIAC CATHETERIZATION     CARDIOVERSION N/A 07/08/2020   Procedure: CARDIOVERSION;  Surgeon: Hester Wolm PARAS, MD;  Location: [facility] ORS;  Service: Cardiovascular;  Laterality: N/A;   COLONOSCOPY WITH PROPOFOL  N/A 11/19/2021   Procedure: COLONOSCOPY WITH PROPOFOL ;  Surgeon: Stacia Glendia FORBES, MD;  Location: [facility];  Service: Gastroenterology;  Laterality: N/A;   HERNIA REPAIR     x2   IR RADIOLOGIST EVAL & MGMT  05/13/2022   IR RADIOLOGIST EVAL & MGMT  10/27/2022   IR RADIOLOGIST EVAL & MGMT  03/02/2023   IR RADIOLOGIST EVAL & MGMT  10/31/2023   POLYPECTOMY  11/19/2021   Procedure: POLYPECTOMY;  Surgeon: Stacia Glendia FORBES, MD;  Location: [facility];  Service: Gastroenterology;;   RADIOLOGY WITH ANESTHESIA N/A 06/23/2022   Procedure: Right renal tumor ablation;  Surgeon: Jennefer Ester PARAS, MD;  Location: [facility] OR;  Service: Radiology;  Laterality: N/A;   RIGHT HEART CATH N/A 07/13/2022   Procedure: RIGHT HEART CATH;  Surgeon: Rolan Ezra RAMAN, MD;  Location: [facility] INVASIVE CV LAB;  Service: Cardiovascular;  Laterality: N/A;   RIGHT/LEFT HEART CATH AND CORONARY ANGIOGRAPHY N/A 11/19/2022   Procedure: RIGHT/LEFT HEART CATH AND CORONARY ANGIOGRAPHY;  Surgeon: Rolan Ezra RAMAN, MD;  Location:  [facility] INVASIVE CV LAB;  Service: Cardiovascular;  Laterality: N/A;   SHOULDER SURGERY     TEE WITHOUT CARDIOVERSION N/A 07/08/2020   Procedure: TRANSESOPHAGEAL ECHOCARDIOGRAM (TEE);  Surgeon: Hester Wolm PARAS, MD;  Location: [facility];  Service: Cardiovascular;  Laterality: N/A;   TOTAL HIP ARTHROPLASTY Right 02/14/2024   Procedure: ARTHROPLASTY, HIP, TOTAL, ANTERIOR APPROACH, RIGHT;  Surgeon: Vernetta Lonni GRADE, MD;  Location: [facility] OR;  Service: Orthopedics;  Laterality: Right;    Current  Medications: Current Meds  Medication Sig   albuterol  (PROVENTIL ) (2.5 MG/3ML) 0.083% nebulizer solution Take 3 mLs (2.5 mg total) by nebulization every 4 (four) hours as needed for wheezing or shortness of breath.   albuterol  (VENTOLIN  HFA) 108 (90 Base) MCG/ACT inhaler Inhale 2 puffs into the lungs every 6 (six) hours as needed for wheezing or shortness of breath.   allopurinol  (ZYLOPRIM ) 100 MG tablet TAKE 2 TABLETS (200 MG TOTAL) BY MOUTH DAILY.   atorvastatin  (LIPITOR) 80 MG tablet Take 1 tablet (80 mg total) by mouth daily. (Patient taking differently: Take 80 mg by mouth at bedtime.)   carvedilol  (COREG ) 12.5 MG tablet Take 1.5 tablets (18.75 mg total) by mouth 2 (two) times daily with a meal.   colchicine  0.6 MG tablet TAKE HALF TABLETS (0.3 MG TOTAL) BY MOUTH THREE TIMES A WEEK.   dapagliflozin  propanediol (FARXIGA ) 10 MG TABS tablet Take 1 tablet (10 mg total) by mouth daily.   DULoxetine  (CYMBALTA ) 60 MG capsule Take 1 capsule (60 mg total) by mouth daily. For pain   ELIQUIS  5 MG TABS tablet TAKE 1 TABLET BY MOUTH 2 (TWO) TIMES DAILY (AM+PM)   fluticasone -salmeterol (ADVAIR HFA) 230-21 MCG/ACT inhaler Inhale 2 puffs into the lungs 2 (two) times daily.   isosorbide -hydrALAZINE  (BIDIL ) 20-37.5 MG tablet Take 1 tablet by mouth 3 (three) times daily.   Menthol -Methyl Salicylate (MUSCLE RUB) 10-15 % CREA Apply 1 Application topically as needed.   oxyCODONE  (OXY IR/ROXICODONE ) 5 MG immediate release tablet Take 1-2 tablets (5-10 mg total) by mouth every 6 (six) hours as needed for moderate pain (pain score 4-6) (pain score 4-6).   potassium chloride  (KLOR-CON ) 10 MEQ tablet Take 2 tablets (20 mEq total) by mouth daily.   Tiotropium Bromide  Monohydrate (SPIRIVA  RESPIMAT) 2.5 MCG/ACT AERS Inhale 2 puffs into the lungs daily.   tiZANidine  (ZANAFLEX ) 4 MG tablet Take 1 tablet (4 mg total) by mouth every 6 (six) hours as needed for muscle spasms.   torsemide  (DEMADEX ) 20 MG tablet Take 20-40 mg by  mouth See admin instructions. Take one tablet (20 mg) by mouth on Monday, Wednesday, Friday, Sunday. Take two tablets (40 mg) by mouth on Tuesday, Thursday and Saturday.     Allergies:   Patient has no known allergies.   Social History   Socioeconomic History   Marital status: Divorced    Spouse name: Not on file   Number of children: 3   Years of education: Not on file   Highest education level: High school graduate  Occupational History   Occupation: disability  Tobacco Use   Smoking status: Former    Current packs/day: 1.00    Average packs/day: 1 pack/day for 43.0 years (43.0 ttl pk-yrs)    Types: Cigarettes   Smokeless tobacco: Never   Tobacco comments:        6 cigs daily--03/28/2023  Vaping Use   Vaping status: Never Used  Substance and Sexual Activity   Alcohol use: Yes    Alcohol/week: 4.0 standard drinks of  alcohol    Types: 4 Shots of liquor per week   Drug use: Yes    Frequency: 21.0 times per week    Types: Marijuana    Comment: last use Cocaine- 03/28/2021. Still using marijuana, last use 06/22/21   Sexual activity: Not on file  Other Topics Concern   Not on file  Social History Narrative   ** Merged History Encounter **       Social Drivers of Health   Financial Resource Strain: High Risk (07/26/2023)   Overall Financial Resource Strain (CARDIA)    Difficulty of Paying Living Expenses: Very hard  Food Insecurity: Food Insecurity Present (07/26/2023)   Hunger Vital Sign    Worried About Running Out of Food in the Last Year: Sometimes true    Ran Out of Food in the Last Year: Sometimes true  Transportation Needs: Unmet Transportation Needs (07/26/2023)   PRAPARE - Administrator, Civil Service (Medical): Yes    Lack of Transportation (Non-Medical): Yes  Physical Activity: Insufficiently Active (11/16/2021)   Exercise Vital Sign    Days of Exercise per Week: 5 days    Minutes of Exercise per Session: 20 min  Stress: Stress Concern Present  (07/26/2023)   [employer] of Occupational Health - Occupational Stress Questionnaire    Feeling of Stress : Very much  Social Connections: Moderately Integrated (07/26/2023)   Social Connection and Isolation Panel    Frequency of Communication with Friends and Family: More than three times a week    Frequency of Social Gatherings with Friends and Family: Once a week    Attends Religious Services: 1 to 4 times per year    Active Member of [employer] or Organizations: Yes    Attends Banker Meetings: Never    Marital Status: Divorced     Family History: The patient's family history includes Diabetes in his mother; HIV in his brother; Healthy in his daughter and son; Heart disease in his father.  ROS:   Please see the history of present illness.     All other systems reviewed and are negative.  EKGs/Labs/Other Studies Reviewed:    The following studies were reviewed today:   EKG:  EKG not ordered today.   Recent Labs: 01/16/2024: ALT 19; B Natriuretic Peptide 339.4 02/15/2024: BUN 24; Creatinine, Ser 1.83; Hemoglobin 14.7; Platelets 203; Potassium 4.5; Sodium 133  Recent Lipid Panel    Component Value Date/Time   CHOL 155 02/25/2023 1120   CHOL 160 11/07/2019 0918   CHOL 157 10/21/2013 0435   TRIG 376 (H) 02/25/2023 1120   TRIG 117 10/21/2013 0435   HDL 27 (L) 02/25/2023 1120   HDL 40 11/07/2019 0918   HDL 30 (L) 10/21/2013 0435   CHOLHDL 5.7 02/25/2023 1120   VLDL 75 (H) 02/25/2023 1120   VLDL 23 10/21/2013 0435   LDLCALC 53 02/25/2023 1120   LDLCALC 92 11/07/2019 0918   LDLCALC 104 (H) 10/21/2013 0435     Risk Assessment/Calculations:      Physical Exam:    VS:  BP 122/76   Pulse 81   Ht 6' (1.829 m)   Wt 262 lb 9.6 oz (119.1 kg)   SpO2 95%   BMI 35.61 kg/m     Wt Readings from Last 3 Encounters:  03/12/24 262 lb 9.6 oz (119.1 kg)  02/14/24 273 lb (123.8 kg)  02/08/24 270 lb (122.5 kg)     GEN:  Well nourished, well developed in no acute  distress HEENT: Normal NECK: No JVD; No carotid bruits CARDIAC: RRR, no murmurs, rubs, gallop RESPIRATORY: Right lung wheezing, diminished breath sounds bilaterally ABDOMEN: Soft, non-tender, distended MUSCULOSKELETAL:  No edema; No deformity  SKIN: Warm and dry NEUROLOGIC:  Alert and oriented x 3 PSYCHIATRIC:  Normal affect   ASSESSMENT:    1. NICM (nonischemic cardiomyopathy) (HCC)   2. Coronary artery disease involving native heart without angina pectoris, unspecified vessel or lesion type   3. Hypertrophic cardiomyopathy (HCC)   4. Atrial flutter, unspecified type (HCC)   5. Primary hypertension    PLAN:    In order of problems listed above:  NICM, normalized EF of 50% (initial was 25%).  Last EF 5/24 was 45 to 50%.  Left heart cath with no obstructive CAD.  65 proximal RCA stenosis.  Etiology likely nonischemic secondary to cocaine use versus uncontrolled hypertension.  Appears euvolemic. Describes NYHA class II-III symptoms.  Continue BiDil  1 tab 3 times daily.  Coreg  18.75 mg twice daily, Farxiga , torsemide  20 mg daily, alternating with 40 mg daily.  Appreciate input from heart failure team. Nonobstructive CAD, denies chest pain.  Continue Eliquis , Lipitor 80. HCM, severe asymmetric septal hypertrophy consistent with HCM, no LVOT obstruction.  Continue Coreg  as above. Atrial flutter, DCCV 06/2020  Maintaining sinus rhythm.  Coreg , Eliquis . Hypertension, BP controlled.  Continue bidil  1 tabs tid. Coreg  18.75 mg bid, torsemide .    Follow-up in 12 months    Medication Adjustments/Labs and Tests Ordered: Current medicines are reviewed at length with the patient today.  Concerns regarding medicines are outlined above.  Orders Placed This Encounter  Procedures   EKG 12-Lead   No orders of the defined types were placed in this encounter.   Patient Instructions  Medication Instructions:  Your physician recommends that you continue on your current medications as directed.  Please refer to the Current Medication list given to you today.   *If you need a refill on your cardiac medications before your next appointment, please call your pharmacy*  Lab Work: No labs ordered today  If you have labs (blood work) drawn today and your tests are completely normal, you will receive your results only by: MyChart Message (if you have MyChart) OR A paper copy in the mail If you have any lab test that is abnormal or we need to change your treatment, we will call you to review the results.  Testing/Procedures: No test ordered today   Follow-Up: At [facility], you and your health needs are our priority.  As part of our continuing mission to provide you with exceptional heart care, our providers are all part of one team.  This team includes your primary Cardiologist (physician) and Advanced Practice Providers or APPs (Physician Assistants and Nurse Practitioners) who all work together to provide you with the care you need, when you need it.  Your next appointment:   1 year(s)  Provider:   You may see Redell Cave, MD or one of the following Advanced Practice Providers on your designated Care Team:   Lonni Meager, NP Lesley Maffucci, PA-C Bernardino Bring, PA-C Cadence Martinsburg, PA-C Tylene Lunch, NP Barnie Hila, NP    We recommend signing up for the patient portal called MyChart.  Sign up information is provided on this After Visit Summary.  MyChart is used to connect with patients for Virtual Visits (Telemedicine).  Patients are able to view lab/test results, encounter notes, upcoming appointments, etc.  Non-urgent messages can be sent to your provider  as well.   To learn more about what you can do with MyChart, go to [email or web address].             Signed, Redell Cave, MD  03/12/2024 11:32 AM    [name]

## 2024-03-13 ENCOUNTER — Other Ambulatory Visit: Payer: Self-pay | Admitting: Orthopaedic Surgery

## 2024-03-13 ENCOUNTER — Telehealth: Payer: Self-pay | Admitting: Orthopaedic Surgery

## 2024-03-13 ENCOUNTER — Telehealth: Payer: Self-pay | Admitting: Physician Assistant

## 2024-03-13 MED ORDER — TIZANIDINE HCL 4 MG PO TABS: 4.0000 mg | ORAL_TABLET | Freq: Four times a day (QID) | ORAL | 1 refills | Status: DC | PRN

## 2024-03-13 MED ORDER — OXYCODONE HCL 5 MG PO TABS: 5.0000 mg | ORAL_TABLET | Freq: Four times a day (QID) | ORAL | 0 refills | Status: DC | PRN

## 2024-03-13 NOTE — Telephone Encounter (Signed)
 Patient called and said he needs a refill on muscle relaxers and Oxycodone . CB#[phone]

## 2024-03-13 NOTE — Telephone Encounter (Signed)
 Pt calledf about update of refill. Please call pt about this matter at [phone].

## 2024-03-19 ENCOUNTER — Other Ambulatory Visit: Payer: Self-pay | Admitting: Orthopaedic Surgery

## 2024-03-19 ENCOUNTER — Telehealth: Payer: Self-pay | Admitting: Physician Assistant

## 2024-03-19 MED ORDER — TIZANIDINE HCL 4 MG PO TABS: 4.0000 mg | ORAL_TABLET | Freq: Four times a day (QID) | ORAL | 1 refills | Status: DC | PRN

## 2024-03-19 MED ORDER — HYDROCODONE-ACETAMINOPHEN 5-325 MG PO TABS: 1.0000 | ORAL_TABLET | Freq: Four times a day (QID) | ORAL | 0 refills | Status: DC | PRN

## 2024-03-19 NOTE — Telephone Encounter (Signed)
Ashley spoke with patient.  

## 2024-03-19 NOTE — Telephone Encounter (Signed)
 Patient called and needs refill on muscle relaxers and pain medication. CB#[phone]

## 2024-03-20 ENCOUNTER — Ambulatory Visit: Payer: Self-pay | Admitting: Internal Medicine

## 2024-03-26 ENCOUNTER — Other Ambulatory Visit: Payer: Self-pay | Admitting: Physician Assistant

## 2024-03-26 ENCOUNTER — Telehealth: Payer: Self-pay | Admitting: Physician Assistant

## 2024-03-26 MED ORDER — HYDROCODONE-ACETAMINOPHEN 5-325 MG PO TABS: 1.0000 | ORAL_TABLET | Freq: Four times a day (QID) | ORAL | 0 refills | Status: DC | PRN

## 2024-03-26 NOTE — Telephone Encounter (Signed)
 Patient called and needs a refill on pain medication. CB#[phone]

## 2024-03-26 NOTE — Telephone Encounter (Signed)
 Pt called stating he has an funeral to go to and had to change appt. Pt r/s for 10/23. Pt asking for a sooner appt that week but not the 10/15. Please call pt at [phone]

## 2024-03-26 NOTE — Telephone Encounter (Signed)
 Changed appt 10/14 @ 10:30am

## 2024-03-27 ENCOUNTER — Other Ambulatory Visit: Payer: Self-pay | Admitting: Orthopaedic Surgery

## 2024-03-27 ENCOUNTER — Ambulatory Visit (INDEPENDENT_AMBULATORY_CARE_PROVIDER_SITE_OTHER): Admitting: Physician Assistant

## 2024-03-27 ENCOUNTER — Other Ambulatory Visit: Payer: Self-pay | Admitting: Internal Medicine

## 2024-03-27 ENCOUNTER — Encounter: Admitting: Cardiology

## 2024-03-27 ENCOUNTER — Encounter: Payer: Self-pay | Admitting: Physician Assistant

## 2024-03-27 DIAGNOSIS — Z96641 Presence of right artificial hip joint: Secondary | ICD-10-CM

## 2024-03-27 DIAGNOSIS — J449 Chronic obstructive pulmonary disease, unspecified: Secondary | ICD-10-CM

## 2024-03-27 NOTE — Telephone Encounter (Unsigned)
 Copied from CRM #[id number]. Topic: Clinical - Medication Refill >> Mar 27, 2024 12:23 PM Yolanda T wrote: Patient has been without his inhaler for 2 weeks  Medication: fluticasone -salmeterol (ADVAIR HFA) 230-21 MCG/ACT inhaler  Has the patient contacted their pharmacy? Yes  This is the patient's preferred pharmacy:  [id number] - [place], [place] - [address]  Phone: [phone] Fax: [phone]  Is this the correct pharmacy for this prescription? Yes  Has the prescription been filled recently? Yes  Is the patient out of the medication? Yes  Has the patient been seen for an appointment in the last year OR does the patient have an upcoming appointment? Yes  Can we respond through MyChart? Yes  Agent: Please be advised that Rx refills may take up to 3 business days. We ask that you follow-up with your pharmacy.

## 2024-03-27 NOTE — Progress Notes (Signed)
 HPI: Chris [name] comes in today status post right total hip arthroplasty 02/14/2024.  He still has some pain lateral aspect of the hip.  Worse with walking.  Said no known injuries.  No fevers chills.  Continues to take Norco for pain.  Review of systems: Denies any fevers chills Physical exam: General: Well-developed well-nourished male no acute distress ambulates without any assistive device nonantalgic gait. Right hip: Surgical incisions well-healed.  Good range of motion of the hip without significant discomfort.  Calf supple nontender.  Impression: Status post right total hip arthroplasty 02/14/2024  Plan: He will continue to work on range of motion and strengthening.  He will walk for exercise therapy.  Follow-up with us  at 61-month postop.  At that time we will obtain an AP pelvis and a lateral view of the right hip.  Questions were encouraged and answered at length.  Follow-up with us  sooner if there is any questions concerns.

## 2024-03-28 ENCOUNTER — Encounter: Admitting: Physician Assistant

## 2024-03-29 MED ORDER — FLUTICASONE-SALMETEROL 230-21 MCG/ACT IN AERO: 2.0000 | INHALATION_SPRAY | Freq: Two times a day (BID) | RESPIRATORY_TRACT | 5 refills | Status: AC

## 2024-03-29 NOTE — Telephone Encounter (Signed)
 Requested Prescriptions  Pending Prescriptions Disp Refills   fluticasone -salmeterol (ADVAIR HFA) 230-21 MCG/ACT inhaler 2 each 5    Sig: Inhale 2 puffs into the lungs 2 (two) times daily.     Pulmonology:  Combination Products Passed - 03/29/2024  1:41 PM      Passed - Valid encounter within last 12 months    Recent Outpatient Visits           1 month ago Chronic bronchitis, unspecified chronic bronchitis type (HCC)   [facility] [place] Delbert Clam, MD   2 months ago Patient left before evaluation by physician   [facility] [place]. [facility] Vicci Sober B, MD   8 months ago Type 2 diabetes mellitus with obesity   [facility] [place] Vicci Sober NOVAK, MD   1 year ago Chronic systolic heart failure [facility])   [facility] [place] Jon HERO, PA-C   1 year ago Type 2 diabetes mellitus with obesity   [facility] [place]. [facility] Vicci Sober NOVAK, MD       Future Appointments             In 6 months Gretta Bertrum ORN, PA-C [place]

## 2024-04-02 ENCOUNTER — Telehealth: Payer: Self-pay | Admitting: Physician Assistant

## 2024-04-02 ENCOUNTER — Other Ambulatory Visit: Payer: Self-pay | Admitting: Physician Assistant

## 2024-04-02 MED ORDER — HYDROCODONE-ACETAMINOPHEN 5-325 MG PO TABS: 1.0000 | ORAL_TABLET | Freq: Four times a day (QID) | ORAL | 0 refills | Status: DC | PRN

## 2024-04-02 NOTE — Telephone Encounter (Signed)
 Rx refill Hydrocodone      [facility]

## 2024-04-03 ENCOUNTER — Telehealth: Payer: Self-pay | Admitting: Physician Assistant

## 2024-04-03 NOTE — Telephone Encounter (Signed)
 LVM received approval for medication through CoverMyMeds.   PA Case ID #: [id number] Rx #: [id number]

## 2024-04-03 NOTE — Telephone Encounter (Signed)
 Pt called stating [employer] is sending PA Gretta some papers concerning pt medication dn pt need [place] to [employer] some he can more than a 5 day supply of his pain medication. Please call pt about this matter. Pt states [employer] informed him that are faxing it today. Pt phone number is [phone].

## 2024-04-03 NOTE — Telephone Encounter (Signed)
 Duplicate. Spoke with patient, Rx was sent in yesterday, received approval around 1:18pm today called and left a voicemail he could check with pharmacy if ready for pickup.

## 2024-04-03 NOTE — Telephone Encounter (Signed)
 Patient called and said he needs a refill on pain medication. CB#[phone]

## 2024-04-04 ENCOUNTER — Telehealth: Payer: Self-pay | Admitting: Radiology

## 2024-04-04 NOTE — Telephone Encounter (Signed)
 Patient requesting refill on pain medication.

## 2024-04-05 ENCOUNTER — Encounter: Admitting: Physician Assistant

## 2024-04-06 ENCOUNTER — Other Ambulatory Visit: Payer: Self-pay | Admitting: Physician Assistant

## 2024-04-06 ENCOUNTER — Telehealth: Payer: Self-pay | Admitting: Physician Assistant

## 2024-04-06 MED ORDER — TIZANIDINE HCL 4 MG PO TABS: 4.0000 mg | ORAL_TABLET | Freq: Four times a day (QID) | ORAL | 1 refills | Status: AC | PRN

## 2024-04-06 MED ORDER — HYDROCODONE-ACETAMINOPHEN 5-325 MG PO TABS: 1.0000 | ORAL_TABLET | Freq: Four times a day (QID) | ORAL | 0 refills | Status: DC | PRN

## 2024-04-06 NOTE — Telephone Encounter (Signed)
 Pt called stating that he wants his Hydrocodone  and muscle relaxer refilled and that he needs a 7 day supply. Pharmacy is [facility] in [place]. Pt call back number is [phone]. Pt would like a call back

## 2024-04-12 ENCOUNTER — Other Ambulatory Visit: Payer: Self-pay | Admitting: Physician Assistant

## 2024-04-12 ENCOUNTER — Telehealth: Payer: Self-pay | Admitting: Physician Assistant

## 2024-04-12 MED ORDER — HYDROCODONE-ACETAMINOPHEN 5-325 MG PO TABS: 1.0000 | ORAL_TABLET | Freq: Four times a day (QID) | ORAL | 0 refills | Status: DC | PRN

## 2024-04-12 NOTE — Telephone Encounter (Signed)
 Patient called and needs a refill on pain medication. He ask if [facility] could [facility]. CB#[phone]

## 2024-04-16 ENCOUNTER — Encounter: Payer: Self-pay | Admitting: Radiology

## 2024-04-16 ENCOUNTER — Encounter: Payer: Self-pay | Admitting: Physician Assistant

## 2024-04-16 ENCOUNTER — Other Ambulatory Visit: Payer: Self-pay | Admitting: Physician Assistant

## 2024-04-16 ENCOUNTER — Other Ambulatory Visit: Payer: Self-pay

## 2024-04-16 ENCOUNTER — Ambulatory Visit: Admitting: Physician Assistant

## 2024-04-16 DIAGNOSIS — Z96641 Presence of right artificial hip joint: Secondary | ICD-10-CM

## 2024-04-16 NOTE — Progress Notes (Addendum)
 HPI: Mr. Sharpley returns today due to pain right hip after a fall.  He reports he was walking with his grandson in the park the other day and fell onto the right hip.  States that he tripped over a twig.  He wants this examined.  He states he has had some swelling about the hip but drainage or dehiscence of the wound.  Review of systems: See HPI otherwise negative  Physical exam: General Well-developed well-nourished male who ambulates without any assistive device nonantalgic gait. Right hip: Good range of motion without pain.  Tenderness over the trochanteric region.  AP pelvis and a lateral view of the right hip: Well-seated right total hip arthroplasty components.  No acute fractures acute findings.  Impression: Status post right total hip arthroplasty 02/14/2024  Plan: He will continue work on range of motion and strengthening the hip.  Recommend ice over the lateral aspect of the hip which most likely represents contusion.  Follow-up as scheduled.  Questions were encouraged and answered.  He was given a handout of the new medication refill policy for Ortho care.

## 2024-04-17 ENCOUNTER — Other Ambulatory Visit: Payer: Self-pay | Admitting: Physician Assistant

## 2024-04-17 ENCOUNTER — Ambulatory Visit: Attending: Internal Medicine | Admitting: Internal Medicine

## 2024-04-17 VITALS — BP 150/97 | HR 74 | Temp 97.8°F | Ht 72.0 in | Wt 262.0 lb

## 2024-04-17 DIAGNOSIS — I4892 Unspecified atrial flutter: Secondary | ICD-10-CM

## 2024-04-17 DIAGNOSIS — E1169 Type 2 diabetes mellitus with other specified complication: Secondary | ICD-10-CM | POA: Diagnosis not present

## 2024-04-17 DIAGNOSIS — Z23 Encounter for immunization: Secondary | ICD-10-CM | POA: Diagnosis not present

## 2024-04-17 DIAGNOSIS — J449 Chronic obstructive pulmonary disease, unspecified: Secondary | ICD-10-CM

## 2024-04-17 DIAGNOSIS — E669 Obesity, unspecified: Secondary | ICD-10-CM | POA: Diagnosis not present

## 2024-04-17 DIAGNOSIS — Z7984 Long term (current) use of oral hypoglycemic drugs: Secondary | ICD-10-CM | POA: Diagnosis not present

## 2024-04-17 DIAGNOSIS — E1122 Type 2 diabetes mellitus with diabetic chronic kidney disease: Secondary | ICD-10-CM

## 2024-04-17 DIAGNOSIS — F17210 Nicotine dependence, cigarettes, uncomplicated: Secondary | ICD-10-CM

## 2024-04-17 DIAGNOSIS — E119 Type 2 diabetes mellitus without complications: Secondary | ICD-10-CM

## 2024-04-17 DIAGNOSIS — I152 Hypertension secondary to endocrine disorders: Secondary | ICD-10-CM

## 2024-04-17 DIAGNOSIS — I5032 Chronic diastolic (congestive) heart failure: Secondary | ICD-10-CM

## 2024-04-17 DIAGNOSIS — I110 Hypertensive heart disease with heart failure: Secondary | ICD-10-CM

## 2024-04-17 DIAGNOSIS — E1159 Type 2 diabetes mellitus with other circulatory complications: Secondary | ICD-10-CM

## 2024-04-17 DIAGNOSIS — N1832 Chronic kidney disease, stage 3b: Secondary | ICD-10-CM | POA: Diagnosis not present

## 2024-04-17 DIAGNOSIS — Z6835 Body mass index (BMI) 35.0-35.9, adult: Secondary | ICD-10-CM

## 2024-04-17 MED ORDER — TRELEGY ELLIPTA 100-62.5-25 MCG/ACT IN AEPB
1.0000 | INHALATION_SPRAY | Freq: Every day | RESPIRATORY_TRACT | 11 refills | Status: AC
Start: 2024-04-17 — End: ?

## 2024-04-17 NOTE — Patient Instructions (Signed)
  VISIT SUMMARY: Today, you came in for a follow-up visit to manage your diabetes, hypertension, COPD, and congestive heart failure. We reviewed your current medications, discussed your symptoms, and made some adjustments to your treatment plan.  YOUR PLAN: -TYPE 2 DIABETES MELLITUS: Your diabetes is well-controlled with an A1c of 6.4%. Continue taking Farxiga  10 mg daily. Try to reduce your soda intake and drink more water .  -CHRONIC DIASTOLIC HEART FAILURE AND HYPERTENSION: Your blood pressure was a bit high today, likely due to dietary salt intake and a missed dose of Bydil. Make sure to take Bydil as prescribed and try to reduce the amount of salt in your diet. We rechecked your blood pressure during the visit.  -ATRIAL FLUTTER: Atrial flutter is a type of irregular heartbeat. Continue taking Eliquis  as prescribed. You reported no bruising or bleeding, which is good.  -CHRONIC OBSTRUCTIVE PULMONARY DISEASE (COPD): COPD is a lung condition that makes it hard to breathe. Your current inhalers are not providing enough relief. We sent a prescription for a new inhaler, Trelegy, to [facility]. If your insurance covers it, you can stop using Spiriva  and Advair. Continue using albuterol  as needed.  -CHRONIC KIDNEY DISEASE, STAGE 3B: Chronic kidney disease means your kidneys are not working as well as they should. You need to contact your nephrologist to reschedule your appointment.  -TOBACCO USE: You have reduced smoking to one cigarette per week, which is great. However, it's important to quit completely. Keep working towards complete cessation.  -ENCOUNTER FOR IMMUNIZATION: You received your flu shot today to help protect you from the flu this season.  -RIGHT WRIST PAIN (MECHANICAL/OVERUSE): You have soreness in your right wrist likely due to overuse. Try to rest your wrist and avoid activities that cause pain.  INSTRUCTIONS: Please make sure to follow up with your nephrologist to reschedule  your appointment. Continue monitoring your blood pressure and blood sugar at home. If you have any new symptoms or concerns, contact our office.                      Contains text generated by Abridge.                                 Contains text generated by Abridge.

## 2024-04-17 NOTE — Progress Notes (Signed)
 Patient ID: Chris [name], male    DOB: 04-09-1961  MRN: [id number]  CC: Diabetes (DM f/u. [facility] NICE, cough X2 yrs /Flu vax administered on 04/17/2024 - C.A.)   Subjective: Chris [name] is a 63 y.o. male who presents for chronic ds management. 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  History of Present Illness [name] is a 63 year old male with diabetes, hypertension, COPD, and congestive heart failure who presents for follow-up care.  Since last visit with me, patient has had right hip replacement about 2 months ago.  He did not bring his medicines with him; states he was rushing to get to this appointment.  He gets medicines and blister pack from his pharmacy.  He remembers some of the names of his medicines but is unsure of others.  DM: His diabetes is managed with Farxiga  10 mg, and he checks his blood sugar weekly with good results. He avoids sugary snacks and drinks but consumes clear sodas and pig feet despite having congestive heart failure. His last A1c was 6.4, checked two months ago.  HTN/CHFpEF/a.flutter: his blood pressure today was 148/89. He should be on torsemide  20 mg Q M/W/F/Sun and 40 mg other days, potassium 20 meq daily, carvedilol12.5 mg  (1.5 tablets twice a day), Bidil  20/37.5 TID and Eliquis  5 mg BID for atrial flutter. He checks his blood pressure at home weekly, with readings around  141/89. He has not taken his second dose of Bidil  for the day. No bruising or bleeding from Eliquis .  Regarding COPD, he experiences shortness of breath and feels his airway does not open fully. He uses Spiriva  (two puffs once a day) and is out of Advair, which he was supposed to take two puffs twice a day. He uses his inhalers daily but feels they are not effective. He uses a nebulizer solution once a day and Albuterol  inhaler daily. He has significantly reduced cigarette smoking to about one cigarette a week but smokes marijuana occasionally.  CKD: Reports that he missed his last appointment with his nephrologist in [place] due to transportation issues.  Requesting new appointment.  Most recent GFR was 41.  Over the past year his range has been 29-41.  Requesting Ace wrap to put on both of his wrists.  Reports he has been doing some home repairs at his house and after using his hands for several hours, his wrist become sore.    Patient Active Problem List   Diagnosis Date Noted   Status post total replacement of right hip 02/14/2024   Chills 07/29/2023   Unilateral primary osteoarthritis, right hip 07/29/2023   Pain in left shoulder 07/27/2023   Post traumatic stress disorder 09/23/2022   MDD (major depressive disorder), recurrent episode, moderate (HCC) 08/26/2022   Stage 3b chronic kidney disease (HCC) 08/09/2022   Hypertrophic cardiomyopathy (HCC) 08/09/2022   History of renal cell cancer 08/09/2022   Chronic heart failure  with preserved ejection fraction (HFpEF) (HCC) 07/13/2022   Agitation 04/23/2022   RSV (respiratory syncytial virus pneumonia) 04/22/2022   Obesity (BMI 30-39.9) 04/21/2022   COPD exacerbation (HCC) 04/19/2022   Dyslipidemia 04/19/2022   Gout 04/19/2022   Right kidney mass 05/14/2021   CHF exacerbation (HCC) 04/14/2021   Chest pain 04/14/2021   Syncope 04/14/2021   Left-sided weakness 10/28/2020   Typical atrial flutter (HCC)    CHF (congestive heart  failure) (HCC) 07/04/2020   Acute exacerbation of CHF (congestive heart failure) (HCC) 06/16/2020   Influenza vaccine refused 05/06/2020   Acute decompensated heart failure (HCC) 05/04/2020   Illiteracy 05/04/2020   Type 2 diabetes mellitus with stage 3 chronic kidney disease (HCC) 12/25/2019   Elevated troponin I level 10/26/2019   History of gout 02/01/2019   Seasonal allergic rhinitis due to pollen 02/01/2019   Nicotine  use disorder 11/30/2018   Microscopic hematuria 11/30/2018   Depression 11/30/2018   Difficulty controlling anger 11/30/2018   COPD (chronic obstructive pulmonary disease) (HCC)    CKD (chronic kidney disease) stage 3, GFR 30-59 ml/min (HCC) 08/10/2018   Recurrent epistaxis 04/21/2018   Mixed hyperlipidemia 07/28/2017   Essential hypertension 07/28/2017   Chronic systolic heart failure (HCC) 10/25/2014   Cocaine abuse (HCC) 02/20/2013   Cannabis abuse 02/20/2013   Back pain, chronic 02/20/2013     Current Outpatient Medications on File Prior to Visit  Medication Sig Dispense Refill   albuterol  (PROVENTIL ) (2.5 MG/3ML) 0.083% nebulizer solution Take 3 mLs (2.5 mg total) by nebulization every 4 (four) hours as needed for wheezing or shortness of breath. 75 mL 6   albuterol  (VENTOLIN  HFA) 108 (90 Base) MCG/ACT inhaler Inhale 2 puffs into the lungs every 6 (six) hours as needed for wheezing or shortness of breath. 54 g 6   allopurinol  (ZYLOPRIM ) 100 MG tablet TAKE 2 TABLETS (200 MG TOTAL) BY MOUTH DAILY. 60 tablet 6   atorvastatin  (LIPITOR) 80 MG tablet Take 1 tablet (80 mg total) by mouth daily. (Patient taking differently: Take 80 mg by mouth at bedtime.) 90 tablet 1   carvedilol  (COREG ) 12.5 MG tablet Take 1.5 tablets (18.75 mg total) by mouth 2 (two) times daily with a meal. 270 tablet 1   colchicine  0.6 MG tablet TAKE HALF TABLETS (0.3 MG TOTAL) BY MOUTH THREE TIMES A WEEK. 15 tablet 2   dapagliflozin  propanediol (FARXIGA ) 10 MG TABS tablet Take 1 tablet (10 mg  total) by mouth daily. 90 tablet 3   DULoxetine  (CYMBALTA ) 60 MG capsule Take 1 capsule (60 mg total) by mouth daily. For pain 30 capsule 0   ELIQUIS  5 MG TABS tablet TAKE 1 TABLET BY MOUTH 2 (TWO) TIMES DAILY (AM+PM) 60 tablet 11   fluticasone -salmeterol (ADVAIR HFA) 230-21 MCG/ACT inhaler Inhale 2 puffs into the lungs 2 (two) times daily. 2 each 5   isosorbide -hydrALAZINE  (BIDIL ) 20-37.5 MG tablet Take 1 tablet by mouth 3 (three) times daily. 270 tablet 3   Menthol -Methyl Salicylate (MUSCLE RUB) 10-15 % CREA Apply 1 Application topically as needed. 85 g 0   mirtazapine  (REMERON ) 15 MG tablet Take 0.5 tablets (7.5 mg total) by mouth at bedtime. 45 tablet 0   potassium chloride  (KLOR-CON ) 10 MEQ tablet Take 2 tablets (20 mEq total) by mouth daily. 180 tablet 2   Tiotropium Bromide  Monohydrate (SPIRIVA  RESPIMAT) 2.5 MCG/ACT AERS Inhale 2 puffs into the lungs daily. 4 g 6   tiZANidine  (ZANAFLEX ) 4 MG tablet Take 1 tablet (4 mg total) by mouth every  6 (six) hours as needed for muscle spasms. 30 tablet 1   torsemide  (DEMADEX ) 20 MG tablet Take 20-40 mg by mouth See admin instructions. Take one tablet (20 mg) by mouth on Monday, Wednesday, Friday, Sunday. Take two tablets (40 mg) by mouth on Tuesday, Thursday and Saturday.     [DISCONTINUED] metoprolol  tartrate (LOPRESSOR ) 100 MG tablet Take 1 tablet (100 mg total) by mouth 2 (two) times daily. 60 tablet 0   No current facility-administered medications on file prior to visit.    No Known Allergies  Social History   Socioeconomic History   Marital status: Divorced    Spouse name: Not on file   Number of children: 3   Years of education: Not on file   Highest education level: High school graduate  Occupational History   Occupation: disability  Tobacco Use   Smoking status: Former    Current packs/day: 1.00    Average packs/day: 1 pack/day for 43.0 years (43.0 ttl pk-yrs)    Types: Cigarettes   Smokeless tobacco: Never   Tobacco comments:         6 cigs daily--03/28/2023  Vaping Use   Vaping status: Never Used  Substance and Sexual Activity   Alcohol use: Yes    Alcohol/week: 4.0 standard drinks of alcohol    Types: 4 Shots of liquor per week   Drug use: Yes    Frequency: 21.0 times per week    Types: Marijuana    Comment: last use Cocaine- 03/28/2021. Still using marijuana, last use 06/22/21   Sexual activity: Not on file  Other Topics Concern   Not on file  Social History Narrative   ** Merged History Encounter **       Social Drivers of Health   Financial Resource Strain: Low Risk  (04/17/2024)   Overall Financial Resource Strain (CARDIA)    Difficulty of Paying Living Expenses: Not very hard  Food Insecurity: No Food Insecurity (04/17/2024)   Hunger Vital Sign    Worried About Running Out of Food in the Last Year: Never true    Ran Out of Food in the Last Year: Never true  Transportation Needs: No Transportation Needs (04/17/2024)   PRAPARE - Administrator, Civil Service (Medical): No    Lack of Transportation (Non-Medical): No  Physical Activity: Insufficiently Active (04/17/2024)   Exercise Vital Sign    Days of Exercise per Week: 5 days    Minutes of Exercise per Session: 20 min  Stress: No Stress Concern Present (04/17/2024)   [employer] of Occupational Health - Occupational Stress Questionnaire    Feeling of Stress: Only a little  Social Connections: Moderately Integrated (04/17/2024)   Social Connection and Isolation Panel    Frequency of Communication with Friends and Family: More than three times a week    Frequency of Social Gatherings with Friends and Family: Once a week    Attends Religious Services: 1 to 4 times per year    Active Member of [employer] or Organizations: Yes    Attends Banker Meetings: Never    Marital Status: Divorced  Catering Manager Violence: Not At Risk (04/17/2024)   Humiliation, Afraid, Rape, and Kick questionnaire    Fear of Current or  Ex-Partner: No    Emotionally Abused: No    Physically Abused: No    Sexually Abused: No    Family History  Problem Relation Age of Onset   Heart disease Father    Diabetes Mother  HIV Brother    Healthy Son    Healthy Daughter     Past Surgical History:  Procedure Laterality Date   ANKLE SURGERY Left    CARDIAC CATHETERIZATION     CARDIOVERSION N/A 07/08/2020   Procedure: CARDIOVERSION;  Surgeon: Hester Wolm PARAS, MD;  Location: [facility];  Service: Cardiovascular;  Laterality: N/A;   COLONOSCOPY WITH PROPOFOL  N/A 11/19/2021   Procedure: COLONOSCOPY WITH PROPOFOL ;  Surgeon: Stacia Glendia BRAVO, MD;  Location: [facility];  Service: Gastroenterology;  Laterality: N/A;   HERNIA REPAIR     x2   IR RADIOLOGIST EVAL & MGMT  05/13/2022   IR RADIOLOGIST EVAL & MGMT  10/27/2022   IR RADIOLOGIST EVAL & MGMT  03/02/2023   IR RADIOLOGIST EVAL & MGMT  10/31/2023   POLYPECTOMY  11/19/2021   Procedure: POLYPECTOMY;  Surgeon: Stacia Glendia BRAVO, MD;  Location: [facility];  Service: Gastroenterology;;   RADIOLOGY WITH ANESTHESIA N/A 06/23/2022   Procedure: Right renal tumor ablation;  Surgeon: Jennefer Ester PARAS, MD;  Location[facility];  Service: Radiology;  Laterality: N/A;   RIGHT HEART CATH N/A 07/13/2022   Procedure: RIGHT HEART CATH;  Surgeon: Rolan Ezra RAMAN, MD;  Location: [facility] INVASIVE CV LAB;  Service: Cardiovascular;  Laterality: N/A;   RIGHT/LEFT HEART CATH AND CORONARY ANGIOGRAPHY N/A 11/19/2022   Procedure: RIGHT/LEFT HEART CATH AND CORONARY ANGIOGRAPHY;  Surgeon: Rolan Ezra RAMAN, MD;  Location: [facility] INVASIVE CV LAB;  Service: Cardiovascular;  Laterality: N/A;   SHOULDER SURGERY     TEE WITHOUT CARDIOVERSION N/A 07/08/2020   Procedure: TRANSESOPHAGEAL ECHOCARDIOGRAM (TEE);  Surgeon: Hester Wolm PARAS, MD;  Location: [facility];  Service: Cardiovascular;  Laterality: N/A;   TOTAL HIP ARTHROPLASTY Right 02/14/2024   Procedure: ARTHROPLASTY, HIP, TOTAL, ANTERIOR APPROACH, RIGHT;   Surgeon: Vernetta Lonni GRADE, MD;  Location: [facility] OR;  Service: Orthopedics;  Laterality: Right;    ROS: Review of Systems Negative except as stated above  PHYSICAL EXAM: BP (!) 150/97   Pulse 74   Temp 97.8 F (36.6 C) (Oral)   Ht 6' (1.829 m)   Wt 262 lb (118.8 kg)   SpO2 99%   BMI 35.53 kg/m   Physical Exam   General appearance - alert, well appearing, and in no distress Mental status - normal mood, behavior, speech, dress, motor activity, and thought processes Chest - clear to auscultation, no wheezes, rales or rhonchi, symmetric air entry Heart - normal rate, regular rhythm, normal S1, S2, no murmurs, rubs, clicks or gallops Extremities - peripheral pulses normal, no pedal edema, no clubbing or cyanosis MSK: no edema or erythema of wrists. Good ROM    Latest Ref Rng & Units 02/15/2024    6:57 AM 02/08/2024   10:00 AM 01/16/2024   11:03 PM  CMP  Glucose 70 - 99 mg/dL 897  85  95   BUN 8 - 23 mg/dL 24  33  36   Creatinine 0.61 - 1.24 mg/dL 8.16  7.72  7.49   Sodium 135 - 145 mmol/L 133  136  138   Potassium 3.5 - 5.1 mmol/L 4.5  4.3  4.4   Chloride 98 - 111 mmol/L 98  105  103   CO2 22 - 32 mmol/L 25  22  25    Calcium  8.9 - 10.3 mg/dL 9.0  9.0  8.9   Total Protein 6.5 - 8.1 g/dL   7.0   Total Bilirubin 0.0 - 1.2 mg/dL   0.5   Alkaline Phos 38 -  126 U/L   76   AST 15 - 41 U/L   18   ALT 0 - 44 U/L   19    Lipid Panel     Component Value Date/Time   CHOL 155 02/25/2023 1120   CHOL 160 11/07/2019 0918   CHOL 157 10/21/2013 0435   TRIG 376 (H) 02/25/2023 1120   TRIG 117 10/21/2013 0435   HDL 27 (L) 02/25/2023 1120   HDL 40 11/07/2019 0918   HDL 30 (L) 10/21/2013 0435   CHOLHDL 5.7 02/25/2023 1120   VLDL 75 (H) 02/25/2023 1120   VLDL 23 10/21/2013 0435   LDLCALC 53 02/25/2023 1120   LDLCALC 92 11/07/2019 0918   LDLCALC 104 (H) 10/21/2013 0435    CBC    Component Value Date/Time   WBC 18.1 (H) 02/15/2024 0657   RBC 5.04 02/15/2024 0657   HGB 14.7  02/15/2024 0657   HGB 15.4 11/07/2019 0918   HCT 46.2 02/15/2024 0657   HCT 46.2 11/07/2019 0918   PLT 203 02/15/2024 0657   PLT 245 11/07/2019 0918   MCV 91.7 02/15/2024 0657   MCV 90 11/07/2019 0918   MCV 92 10/21/2013 0435   MCH 29.2 02/15/2024 0657   MCHC 31.8 02/15/2024 0657   RDW 15.2 02/15/2024 0657   RDW 14.2 11/07/2019 0918   RDW 15.7 (H) 10/21/2013 0435   LYMPHSABS 2.1 12/09/2022 0930   LYMPHSABS 1.1 10/21/2013 0435   MONOABS 0.8 12/09/2022 0930   MONOABS 0.6 10/21/2013 0435   EOSABS 0.1 12/09/2022 0930   EOSABS 0.0 10/21/2013 0435   BASOSABS 0.1 12/09/2022 0930   BASOSABS 0.0 10/21/2013 0435    ASSESSMENT AND PLAN: 1. Type 2 diabetes mellitus in patient with obesity (HCC) 2. Diabetes mellitus treated with oral medication (HCC) Most recent A1c at goal.  He will continue Farxiga .  Discussed and encouraged healthy eating habits. Referral submitted for DM eye exam  3. Chronic obstructive pulmonary disease, unspecified COPD type (HCC) Patient feels his COPD is not adequately controlled with Advair and Spiriva .  Would like to be tried with a different inhaler.  We will see if his insurance would pay for Trelegy.  If it is approved, patient advised to stop the Advair and Spiriva . - Fluticasone -Umeclidin-Vilant (TRELEGY ELLIPTA ) 100-62.5-25 MCG/ACT AEPB; Inhale 1 puff into the lungs daily. Stop Advair and Spiriva   Dispense: 1 each; Refill: 11  4. CKD stage 3b, GFR 30-44 ml/min (HCC) Not on NSAIDs.  Encouraged him to call and reschedule appointment with his nephrologist.  5. Hypertension associated with diabetes (HCC) Not at goal.  He is overdue for his second dose of BiDil  but did not take it before coming to this appointment.  Discussed and encouraged DASH diet. Continue BiDil , carvedilol ,  6. Chronic diastolic congestive heart failure (HCC) Stable and compensated.  Continue torsemide , potassium supplement, Farxiga , BiDil  and carvedilol   7. Atrial flutter, unspecified  type (HCC) Sounds to be in sinus rhythm on auscultation today.  Continue Eliquis  and carvedilol .  8. Need for immunization against influenza (Primary) - Flu vaccine trivalent PF, 6mos and older(Flulaval,Afluria,Fluarix,Fluzone)  9. Light smoker Commended him on cutting back.  Strongly advised to quit.    Patient was given the opportunity to ask questions.  Patient verbalized understanding of the plan and was able to repeat key elements of the plan.   This documentation was completed using Paediatric nurse.  Any transcriptional errors are unintentional.  Orders Placed This Encounter  Procedures   Flu vaccine  trivalent PF, 6mos and older(Flulaval,Afluria,Fluarix,Fluzone)     Requested Prescriptions   Signed Prescriptions Disp Refills   Fluticasone -Umeclidin-Vilant (TRELEGY ELLIPTA ) 100-62.5-25 MCG/ACT AEPB 1 each 11    Sig: Inhale 1 puff into the lungs daily. Stop Advair and Spiriva     Return in about 4 months (around 08/15/2024).  Barnie Louder, MD, FACP

## 2024-04-18 ENCOUNTER — Encounter: Payer: Self-pay | Admitting: Internal Medicine

## 2024-04-18 ENCOUNTER — Telehealth: Payer: Self-pay

## 2024-04-18 ENCOUNTER — Encounter: Admitting: Cardiology

## 2024-04-18 NOTE — Telephone Encounter (Signed)
 Pharmacy Patient Advocate Encounter  Received notification from [employer] that Prior Authorization for TRELEGY has been APPROVED from 04/18/2024 to 04/18/2025   PA #/Case ID/Reference #: [id number]

## 2024-04-19 ENCOUNTER — Telehealth: Payer: Self-pay | Admitting: Physician Assistant

## 2024-04-19 ENCOUNTER — Other Ambulatory Visit: Payer: Self-pay | Admitting: Physician Assistant

## 2024-04-19 ENCOUNTER — Telehealth: Payer: Self-pay | Admitting: Radiology

## 2024-04-19 NOTE — Telephone Encounter (Signed)
 Patient called. He would like a call back today ASAP or he is coming up here he says.

## 2024-04-19 NOTE — Telephone Encounter (Signed)
 Submitted PA on hydrocodone  through CoverMyMeds on 04/19/24 at 2:29pm. Waiting for response.  (Key: [id number] - [id number]

## 2024-04-19 NOTE — Telephone Encounter (Signed)
[facility] has not replied to your PA request. Turnaround time for review of a PA request is dependent upon insurance plan and can range from 24 hours to 5 calendar days ?

## 2024-04-19 NOTE — Telephone Encounter (Signed)
Patient aware this was called in for him  

## 2024-04-19 NOTE — Telephone Encounter (Signed)
Patient called. He would like a refill on hydrocodone.

## 2024-04-19 NOTE — Telephone Encounter (Signed)
 Approved today by [facility]  Medicaid PA Case: [id number], Status: Approved, Coverage Starts on: 04/19/2024 12:00:00 AM, Coverage Ends on: 10/16/2024 12:00:00 AM. Effective Date: 04/19/2024 Authorization Expiration Date: 10/16/2024

## 2024-04-19 NOTE — Telephone Encounter (Signed)
 Patient called and said he needs his refill on pain medication. CB#[phone]

## 2024-04-24 ENCOUNTER — Other Ambulatory Visit: Payer: Self-pay | Admitting: Physician Assistant

## 2024-05-01 ENCOUNTER — Other Ambulatory Visit: Payer: Self-pay | Admitting: Physician Assistant

## 2024-05-07 ENCOUNTER — Telehealth: Payer: Self-pay | Admitting: Cardiology

## 2024-05-07 NOTE — Telephone Encounter (Signed)
 Called to confirm/remind patient of their appointment at [facility] on 05/08/24.   Appointment:   [x] Confirmed  [] Left mess   [] No answer/No voice mail  [] VM Full/unable to leave message  [] Phone not in service  Patient reminded to bring all medications and/or complete list.  Confirmed patient has transportation. Gave directions, instructed to utilize valet parking.

## 2024-05-08 ENCOUNTER — Encounter: Payer: Self-pay | Admitting: Cardiology

## 2024-05-08 ENCOUNTER — Ambulatory Visit: Attending: Cardiology | Admitting: Cardiology

## 2024-05-08 VITALS — BP 159/107 | HR 70 | Wt 263.5 lb

## 2024-05-08 DIAGNOSIS — E1122 Type 2 diabetes mellitus with diabetic chronic kidney disease: Secondary | ICD-10-CM | POA: Diagnosis not present

## 2024-05-08 DIAGNOSIS — F1491 Cocaine use, unspecified, in remission: Secondary | ICD-10-CM | POA: Insufficient documentation

## 2024-05-08 DIAGNOSIS — Z7984 Long term (current) use of oral hypoglycemic drugs: Secondary | ICD-10-CM | POA: Diagnosis not present

## 2024-05-08 DIAGNOSIS — I2510 Atherosclerotic heart disease of native coronary artery without angina pectoris: Secondary | ICD-10-CM | POA: Diagnosis not present

## 2024-05-08 DIAGNOSIS — E782 Mixed hyperlipidemia: Secondary | ICD-10-CM

## 2024-05-08 DIAGNOSIS — M1990 Unspecified osteoarthritis, unspecified site: Secondary | ICD-10-CM | POA: Insufficient documentation

## 2024-05-08 DIAGNOSIS — E1159 Type 2 diabetes mellitus with other circulatory complications: Secondary | ICD-10-CM

## 2024-05-08 DIAGNOSIS — J449 Chronic obstructive pulmonary disease, unspecified: Secondary | ICD-10-CM | POA: Insufficient documentation

## 2024-05-08 DIAGNOSIS — Z79899 Other long term (current) drug therapy: Secondary | ICD-10-CM | POA: Insufficient documentation

## 2024-05-08 DIAGNOSIS — Z7901 Long term (current) use of anticoagulants: Secondary | ICD-10-CM | POA: Insufficient documentation

## 2024-05-08 DIAGNOSIS — N183 Chronic kidney disease, stage 3 unspecified: Secondary | ICD-10-CM | POA: Diagnosis not present

## 2024-05-08 DIAGNOSIS — Z87891 Personal history of nicotine dependence: Secondary | ICD-10-CM | POA: Insufficient documentation

## 2024-05-08 DIAGNOSIS — I130 Hypertensive heart and chronic kidney disease with heart failure and stage 1 through stage 4 chronic kidney disease, or unspecified chronic kidney disease: Secondary | ICD-10-CM | POA: Insufficient documentation

## 2024-05-08 DIAGNOSIS — I152 Hypertension secondary to endocrine disorders: Secondary | ICD-10-CM

## 2024-05-08 DIAGNOSIS — Z8249 Family history of ischemic heart disease and other diseases of the circulatory system: Secondary | ICD-10-CM | POA: Insufficient documentation

## 2024-05-08 DIAGNOSIS — I5042 Chronic combined systolic (congestive) and diastolic (congestive) heart failure: Secondary | ICD-10-CM | POA: Diagnosis not present

## 2024-05-08 DIAGNOSIS — I5032 Chronic diastolic (congestive) heart failure: Secondary | ICD-10-CM

## 2024-05-08 MED ORDER — POTASSIUM CHLORIDE ER 10 MEQ PO TBCR: 20.0000 meq | EXTENDED_RELEASE_TABLET | Freq: Every day | ORAL | 2 refills | Status: AC

## 2024-05-08 MED ORDER — ISOSORB DINITRATE-HYDRALAZINE 20-37.5 MG PO TABS: 1.0000 | ORAL_TABLET | Freq: Three times a day (TID) | ORAL | 3 refills | Status: AC

## 2024-05-08 MED ORDER — APIXABAN 5 MG PO TABS: 5.0000 mg | ORAL_TABLET | Freq: Two times a day (BID) | ORAL | 3 refills | Status: AC

## 2024-05-08 MED ORDER — DAPAGLIFLOZIN PROPANEDIOL 10 MG PO TABS: 10.0000 mg | ORAL_TABLET | Freq: Every day | ORAL | 3 refills | Status: AC

## 2024-05-08 MED ORDER — TORSEMIDE 20 MG PO TABS: ORAL_TABLET | ORAL | 3 refills | Status: AC

## 2024-05-08 MED ORDER — ATORVASTATIN CALCIUM 80 MG PO TABS: 80.0000 mg | ORAL_TABLET | Freq: Every day | ORAL | 1 refills | Status: AC

## 2024-05-08 MED ORDER — CARVEDILOL 25 MG PO TABS
25.0000 mg | ORAL_TABLET | Freq: Two times a day (BID) | ORAL | 2 refills | Status: AC
Start: 2024-05-08 — End: ?

## 2024-05-08 NOTE — Patient Instructions (Signed)
 Medication Changes:  INCREASE Carvedilol  to 25 mg Twice daily   Lab Work:  Labs are needed today, PLEASE go downstairs to [facility] on [address] to have your blood work completed.  We will only call you if the results are abnormal or if the provider would like to make medication changes.  No news is good news.   Testing/Procedures:  Your physician has requested that you have an echocardiogram. Echocardiography is a painless test that uses sound waves to create images of your heart. It provides your doctor with information about the size and shape of your heart and how well your heart's chambers and valves are working. This procedure takes approximately one hour. There are no restrictions for this procedure. Please do NOT wear cologne, perfume, aftershave, or lotions (deodorant is allowed). Please arrive 15 minutes prior to your appointment time. YOU WILL BE CALLED TO SCHEDULE THIS  Please note: We ask at that you not bring children with you during ultrasound (echo/ vascular) testing. Due to room size and safety concerns, children are not allowed in the ultrasound rooms during exams. Our front office staff cannot provide observation of children in our lobby area while testing is being conducted. An adult accompanying a patient to their appointment will only be allowed in the ultrasound room at the discretion of the ultrasound technician under special circumstances. We apologize for any inconvenience.  Special Instructions // Education:  Do the following things EVERYDAY: Weigh yourself in the morning before breakfast. Write it down and keep it in a log. Take your medicines as prescribed Eat low salt foods--Limit salt (sodium) to 2000 mg per day.  Stay as active as you can everyday Limit all fluids for the day to less than 2 liters   Follow-Up in:   Please follow up with our heart failure pharmacist in 3 weeks  Your physician recommends that you schedule a follow-up appointment in:  4 months (March/April 2026), **PLEASE CALL OUR OFFICE IN FEBRUARY TO SCHEDULE THIS APPOINTMENT    If you have any questions or concerns before your next appointment please send us  a message through [facility] or call our office at [phone], If it is after office hours your call will be answered by our answering service and directed appropriately.     At the [facility], you and your health needs are our priority. We have a designated team specialized in the treatment of Heart Failure. This Care Team includes your primary Heart Failure Specialized Cardiologist (physician), Advanced Practice Providers (APPs- Physician Assistants and Nurse Practitioners), and Pharmacist who all work together to provide you with the care you need, when you need it.   You may see any of the following providers on your designated Care Team at your next follow up:  Dr. Toribio Fuel Dr. Ezra Shuck Dr. Ria Commander Dr. Odis Brownie Greig Mosses, NP Caffie Shed, [place] [address] [place], [place] Beckey Coe, NP Jordan Lee, NP Ellouise Class, NP Jaun Bash, PharmD

## 2024-05-08 NOTE — Progress Notes (Signed)
 PCP: Vicci Barnie NOVAK, MD HF Cardiology: Dr. Rolan  Chief complaint: CHF  63 y.o. with history of HFpEF, CKD stage 3, possible hypertrophic cardiomyopathy, renal cell CA s/p ablation, and atrial flutter s/p 1/22 DCCV who was referred to CHF MD clinic by [facility].  Patient was admitted in 1/22 with CHF and found to be in atrial flutter.  Echo showed EF 25% and he was cardioverted to NSR.  He is no longer anticoagulated. Cardiolite in 2/23 showed no ischemia, fixed inferior defect.  Echo in 8/23 showed EF 50% with severe asymmetric septal hypertrophy but no SAM or LVOT gradient.  Cardiac MRI in 12/23 was similar with LV EF 50%, severe asymmetric septal hypertrophy, normal RV, LGE in the basal septum. These studies were concerning for hypertrophic cardiomyopathy.  Patient is an active smoker and used to use cocaine.  He carries history of COPD.  Grandfather had sudden cardiac death at work (fell out dead).   RHC in 2023-07-24 showed elevated filling pressures, CI 2.25, PVR 3.9 WU.  Torsemide  was increased.    Patient was seen in the ER in 2/6 with abdominal pain, nausea/vomiting.  At that time, creatinine was up to 2.54.   Zio monitor done 2/24 for episodes of ?syncope showed no AF/AFL, 1.5 % PVCs, 6 short NSVT runs (longest was 7 beats).   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  PKP2 gene is associated with ARVC and Brugada, but the clinical significance of the mutation found in this patient is uncertain (not reported in literature).  Patient was seen by Dr. Fairy for genetics counseling, she did not think that these mutations were likely to be malignant.   Echo in 5/24 showed EF 45-50%, moderate asymmetric septal hypertrophy with no LVOT gradient of mitral valve SAM, RV normal, mild-moderate MR, IVC  normal.   LHC/RHC was done in 6/24, showing 65% proximal RCA stenosis, normal filling pressures but low CI at 1.81.   Patient was seen in the ER in 7/24 due to severe low back pain.  UDS was positive for cocaine.  He says this is the first time in years he has used it, due to stress from court case and back pain.   He saw Dr. Nancey with EP, ICD placement was discussed for HCM, he was a borderline candidate and ended up deciding that he did not want ICD.   He returns today for followup of CHF and suspected HCM.  He has quit smoking.  Stable dyspnea after walking about 100 yards.  Main complaint remains aches and pains from arthritis, attributes this to football injuries (played at [employer]).  He recently had right TKR with no complications.  He is lifting weights but not get much cardio exercise.  No chest pain.  No lightheadedness, syncope, palpitations.  No orthopnea/PND.   ECG (personally reviewed): NSR, PVC, anterolateral TWIs  Labs 07/24/2023): BNP 873, K 3.6, creatinine 1.7 Labs (2/24): K 4, creatinine 2.54 Labs (4/24): K 4.2, creatinine 1.75, BNP 280, hgb 14.4 Labs (7/24): K 4.1, creatinine 2.15 => 2.73, BNP 1107, LDL 125 Labs (9/24): LDL 53 Labs (12/24): K 5.2, creatinine 2.33 Labs (3/25): BNP 98, K 4.6, creatinine 2.34 Labs (9/25): K 4.5, creatinine 1.83  PMH: 1. Renal cell carcinoma on right: s/p ablation 2023/07/24.  2. HF with  mid range EF: ?HCM, ?cardiomyopathy related to cocaine in the past.  EF low in the past, echo in 1/22 with EF 22%.   - Cardiolite (2/23): Fixed inferior defect, ?artifact.  - Echo (8/23): EF 50%, severe asymmetric septal hypertrophy, no SAM/MR, no LVOT gradient, RV normal.  - Cardiac MRI (12/23): LV EF 50%, severe asymmetric septal hypertrophy, normal RV, LGE in the basal septum.  Possible hypertrophic cardiomyopathy.  - RHC (1/24): mean RA 15, PA 50/31 mean 42, mean PCWP 21, CI 2.25, PVR 3.9 WU, PAPi 1.3 - Zio monitor done 2/24 showed no AF/AFL, 1.5 % PVCs, 6 short  NSVT runs (longest was 7 beats).  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  PKP2 gene is associated with ARVC and Brugada, but the clinical significance of the mutation found in this patient is uncertain (not reported in literature).  Seen by Dr. Fairy, mutations not thought to be clinically significant.  - Echo (5/24): EF 45-50%, moderate asymmetric septal hypertrophy with no LVOT gradient of mitral valve SAM, RV normal, mild-moderate MR, IVC normal. - [facility] (6/24): 65% proximal RCA stenosis; mean RA 5, PA 32/12 mean 24, mean PCWP 7,CI 1.81. 3. Atrial flutter: DCCV 1/22.   4. Type 2 diabetes.  5. HTN 6. CKD stage 3 7. Gout 8. Depression 9. COPD: Active smoker.  10. Prior cocaine abuse.  11. Right TKR 2025  Social History   Socioeconomic History   Marital status: Divorced    Spouse name: Not on file   Number of children: 3   Years of education: Not on file   Highest education level: High school graduate  Occupational History   Occupation: disability  Tobacco Use   Smoking status: Former    Current packs/day: 1.00    Average packs/day: 1 pack/day for 43.0 years (43.0 ttl pk-yrs)    Types: Cigarettes   Smokeless tobacco: Never   Tobacco comments:        6 cigs daily--03/28/2023  Vaping Use   Vaping status: Never Used  Substance and Sexual Activity   Alcohol use: Yes    Alcohol/week: 4.0 standard drinks of alcohol    Types: 4 Shots of liquor per week   Drug use: Yes    Frequency: 21.0 times per week    Types: Marijuana    Comment: last use Cocaine- 03/28/2021. Still using marijuana, last use 06/22/21   Sexual activity: Not on file  Other Topics Concern   Not on file  Social History Narrative   ** Merged History Encounter **       Social Drivers of Health   Financial  Resource Strain: Low Risk  (04/17/2024)   Overall Financial Resource Strain (CARDIA)    Difficulty of Paying Living Expenses: Not very hard  Food Insecurity: No Food Insecurity (04/17/2024)   Hunger Vital Sign    Worried About Running Out of Food in the Last Year: Never true    Ran Out of Food in the Last Year: Never true  Transportation Needs: No Transportation Needs (04/17/2024)   PRAPARE - Administrator, Civil Service (Medical): No    Lack of Transportation (Non-Medical): No  Physical Activity: Insufficiently Active (04/17/2024)   Exercise Vital Sign    Days of Exercise per Week: 5 days    Minutes  of Exercise per Session: 20 min  Stress: No Stress Concern Present (04/17/2024)   [employer] of Occupational Health - Occupational Stress Questionnaire    Feeling of Stress: Only a little  Social Connections: Moderately Integrated (04/17/2024)   Social Connection and Isolation Panel    Frequency of Communication with Friends and Family: More than three times a week    Frequency of Social Gatherings with Friends and Family: Once a week    Attends Religious Services: 1 to 4 times per year    Active Member of [employer] or Organizations: Yes    Attends Banker Meetings: Never    Marital Status: Divorced  Catering Manager Violence: Not At Risk (04/17/2024)   Humiliation, Afraid, Rape, and Kick questionnaire    Fear of Current or Ex-Partner: No    Emotionally Abused: No    Physically Abused: No    Sexually Abused: No   Family History  Problem Relation Age of Onset   Heart disease Father    Diabetes Mother    HIV Brother    Healthy Son    Healthy Daughter    ROS: All systems reviewed and negative except as per HPI.   Current Outpatient Medications  Medication Sig Dispense Refill   albuterol  (PROVENTIL ) (2.5 MG/3ML) 0.083% nebulizer solution Take 3 mLs (2.5 mg total) by nebulization every 4 (four) hours as needed for wheezing or shortness of breath. 75 mL 6    albuterol  (VENTOLIN  HFA) 108 (90 Base) MCG/ACT inhaler Inhale 2 puffs into the lungs every 6 (six) hours as needed for wheezing or shortness of breath. 54 g 6   allopurinol  (ZYLOPRIM ) 100 MG tablet TAKE 2 TABLETS (200 MG TOTAL) BY MOUTH DAILY. 60 tablet 6   fluticasone -salmeterol (ADVAIR HFA) 230-21 MCG/ACT inhaler Inhale 2 puffs into the lungs 2 (two) times daily. 2 each 5   Fluticasone -Umeclidin-Vilant (TRELEGY ELLIPTA ) 100-62.5-25 MCG/ACT AEPB Inhale 1 puff into the lungs daily. Stop Advair and Spiriva  1 each 11   apixaban  (ELIQUIS ) 5 MG TABS tablet Take 1 tablet (5 mg total) by mouth 2 (two) times daily. 180 tablet 3   atorvastatin  (LIPITOR) 80 MG tablet Take 1 tablet (80 mg total) by mouth daily. 90 tablet 1   carvedilol  (COREG ) 25 MG tablet Take 1 tablet (25 mg total) by mouth 2 (two) times daily with a meal. 180 tablet 2   colchicine  0.6 MG tablet TAKE HALF TABLETS (0.3 MG TOTAL) BY MOUTH THREE TIMES A WEEK. (Patient not taking: Reported on 05/08/2024) 15 tablet 2   dapagliflozin  propanediol (FARXIGA ) 10 MG TABS tablet Take 1 tablet (10 mg total) by mouth daily. 90 tablet 3   DULoxetine  (CYMBALTA ) 60 MG capsule Take 1 capsule (60 mg total) by mouth daily. For pain (Patient not taking: Reported on 05/08/2024) 30 capsule 0   HYDROcodone -acetaminophen  (NORCO/VICODIN) 5-325 MG tablet TAKE 1 TABLET BY MOUTH EVERY 6 (SIX) HOURS AS NEEDED FOR MODERATE PAIN (PAIN SCORE 4-6). 30 tablet 0   isosorbide -hydrALAZINE  (BIDIL ) 20-37.5 MG tablet Take 1 tablet by mouth 3 (three) times daily. 270 tablet 3   Menthol -Methyl Salicylate (MUSCLE RUB) 10-15 % CREA Apply 1 Application topically as needed. 85 g 0   mirtazapine  (REMERON ) 15 MG tablet Take 0.5 tablets (7.5 mg total) by mouth at bedtime. 45 tablet 0   potassium chloride  (KLOR-CON ) 10 MEQ tablet Take 2 tablets (20 mEq total) by mouth daily. 180 tablet 2   Tiotropium Bromide  Monohydrate (SPIRIVA  RESPIMAT) 2.5 MCG/ACT AERS Inhale 2 puffs into  the lungs daily.  4 g 6   tiZANidine  (ZANAFLEX ) 4 MG tablet Take 1 tablet (4 mg total) by mouth every 6 (six) hours as needed for muscle spasms. 30 tablet 1   torsemide  (DEMADEX ) 20 MG tablet Take one tablet (20 mg) by mouth on Mon, Wed, Fri, Sun and Take two tablets (40 mg) by mouth on Tue, Thur, Sat 135 tablet 3   No current facility-administered medications for this visit.   BP (!) 159/107   Pulse 70   Wt 263 lb 8 oz (119.5 kg)   SpO2 99%   BMI 35.74 kg/m  General: NAD Neck: Thick, no JVD, no thyromegaly or thyroid  nodule.  Lungs: Occasional rhonchi CV: Nondisplaced PMI.  Heart regular S1/S2, no S3/S4, no murmur.  No peripheral edema.  No carotid bruit.  Normal pedal pulses.  Abdomen: Soft, nontender, no hepatosplenomegaly, no distention.  Skin: Intact without lesions or rashes.  Neurologic: Alert and oriented x 3.  Psych: Normal affect. Extremities: No clubbing or cyanosis.  HEENT: Normal.   1. HFpEF/?hypertrophic cardiomyopathy: Prior history of HFrEF that may have been tachycardia-mediated in setting of atrial flutter in 1/22 vs cocaine-related, now EF has recovered to 50% on last study. Cardiolite in 2/23 showed no ischemia, fixed inferior defect.  Echo in 8/23 showed EF 50% with severe asymmetric septal hypertrophy but no SAM or LVOT gradient.  Cardiac MRI in 12/23 was similar with LV EF 50%, severe asymmetric septal hypertrophy, normal RV, LGE in the basal septum. These studies were concerning for hypertrophic cardiomyopathy.  RHC in 1/24 showed elevated filling pressures with CI 2.25.  His grandfather may have had sudden cardiac death at work, father died at 61 from heart disease.  He has had syncopal episodes that may have been orthostatic, but I worried about ventricular arrhythmias given possible HCM and LGE noted on cardiac MRI. Zio monitor in 2/24 showed 1.5% PVCs and 6 short NSVT runs (longest 7 beats).  Nothing to explain syncope and has had no further episodes. 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  PKP2 gene is associated with ARVC and Brugada, but the clinical significance of the mutation found in this patient is uncertain (not reported in literature).  He was seen by Dr. Fairy, mutations not thought to be clinically significant.  Echo in 5/24 showed EF 45-50%, moderate asymmetric septal hypertrophy with no LVOT gradient of mitral valve SAM, RV normal, mild-moderate MR, IVC normal. Cath in 6/24 showed nonobstructive CAD, normal filling pressures, low CI. 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  NYHA class II-III, stable.  Not volume overloaded on exam.  - He was seen by Dr. Nancey for EP, thought to be borderline for ICD.  After discussion, he decided against placement.  - Continue Bidil  1 tab tid.   - Increase Coreg  to 25 mg bid.  - I will continue torsemide  20 mg daily alternating with 40 mg daily, BMET/BNP today.  - Continue dapagliflozin  10 mg daily.  - I will arrange for echo. 2. CKD stage 3: BMET/BNP today. Has referral to nephrology.   3. COPD: He has quit smoking.   4. Atrial flutter: S/p DCCV in 1/22.  NSR today.  - Continue apixaban .  5. Cocaine abuse: UDS positive in 7/24.   Says this was a one-time event.  6. 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  - Continue atorvastatin , check lipids today.  7. Suspect OSA: Has not yet done sleep study.       Followup in 4 months  I spent 31 minutes reviewing records, interviewing/examining patient, and managing orders.   Chris [name] 05/08/2024

## 2024-05-15 ENCOUNTER — Other Ambulatory Visit: Payer: Self-pay | Admitting: Physician Assistant

## 2024-05-16 ENCOUNTER — Other Ambulatory Visit: Payer: Self-pay | Admitting: Physician Assistant

## 2024-05-16 MED ORDER — HYDROCODONE-ACETAMINOPHEN 5-325 MG PO TABS: 1.0000 | ORAL_TABLET | Freq: Four times a day (QID) | ORAL | 0 refills | Status: DC | PRN

## 2024-05-23 ENCOUNTER — Other Ambulatory Visit: Payer: Self-pay | Admitting: Physician Assistant

## 2024-05-23 MED ORDER — HYDROCODONE-ACETAMINOPHEN 5-325 MG PO TABS: 1.0000 | ORAL_TABLET | Freq: Four times a day (QID) | ORAL | 0 refills | Status: DC | PRN

## 2024-05-29 ENCOUNTER — Telehealth: Payer: Self-pay | Admitting: Pharmacist

## 2024-05-29 NOTE — Telephone Encounter (Signed)
 Called to confirm/remind patient of their appointment at the [facility] on 05/30/24.   Appointment:   [] Confirmed  [x] Left mess   [] No answer/No voice mail  [] VM Full/unable to leave message  [] Phone not in service  Patient reminded to bring all medications and/or complete list.  Confirmed patient has transportation. Gave directions, instructed to utilize valet parking.

## 2024-05-30 ENCOUNTER — Ambulatory Visit

## 2024-05-30 NOTE — Progress Notes (Deleted)
 Advanced Heart Failure Clinic Note  PCP: Vicci Barnie NOVAK, MD PCP-Cardiologist: Redell Cave, MD HF-Cardiologist: Ezra Shuck, MD  HPI:  63 y.o. 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  Patient was admitted in Jul 11, 2020 with CHF and found to be in atrial flutter.  Echo showed EF 25% and he was cardioverted to NSR.  He is no longer anticoagulated. Cardiolite in 07/2021 showed no ischemia, fixed inferior defect.  Echo in 01/2022 showed EF 50% with severe asymmetric septal hypertrophy but no SAM or LVOT gradient.  Cardiac MRI in 05/2022 was similar with LV EF 50%, severe asymmetric septal hypertrophy, normal RV, LGE in the basal septum. These studies were concerning for hypertrophic cardiomyopathy.  Patient is an active smoker and used to use cocaine.  He carries history of COPD.  Grandfather had sudden cardiac death at work (fell out dead).    RHC in 11-Jul-2022 showed elevated filling pressures, CI 2.25, PVR 3.9 WU.  Torsemide  was increased.     Patient was seen in the ER in 07/2022 with abdominal pain, nausea/vomiting.  At that time, creatinine was up to 2.54.    Zio monitor done 07/2022 for episodes of ?syncope showed no AF/AFL, 1.5 % PVCs, 6 short NSVT runs (longest was 7 beats).    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  PKP2 gene is associated with ARVC and Brugada, but the clinical significance of the mutation found in this patient is uncertain (not reported in literature).  Patient was seen by Dr. Fairy for genetics counseling, she did not think that these mutations were likely to be malignant.    Echo in 10/2022 showed EF 45-50%, moderate asymmetric  septal hypertrophy with no LVOT gradient of mitral valve SAM, RV normal, mild-moderate MR, IVC normal.    LHC/RHC was done in 11/2022, showing 65% proximal RCA stenosis, normal filling pressures but low CI at 1.81.    Patient was seen in the ER in 12/2022 due to severe low back pain.  UDS was positive for cocaine.  He says this is the first time in years he has used it, due to stress from court case and back pain.    He saw Dr. Nancey with EP, ICD placement was discussed for HCM, he was a borderline candidate and ended up deciding that he did not want ICD.    Seen 05/08/24 by Dr. Shuck for CHF and suspected HCM.  He had quit smoking.  Stable dyspnea after walking about 100 yards. Carvedilol  was increased to 25 mg BID.  Today Chris [name] returns to Heart Failure Clinic for pharmacist medication titration. Reports feeling ***. {Reports/Denies:210917258} {ACTIONS;DENIES/REPORTS:21021675::Denies} being able to complete all activities of daily living (ADLs). Is *** active throughout the day. Weight at home is *** pounds. Takes {CHL AMB AHFC Medications:210917260} ***. Appetite ***. {Does Follow/Does Not Follow:210917261} a low sodium diet.  Current Heart Failure Medications: Loop diuretic: torsemide  40 mg TThS and 20 mg all other days Beta-Blocker: carvedilol  25 mg BID ACEI/ARB/ARNI: none MRA: none SGLT2i: Farxiga  10 mg daily Other: BiDil  1 tablet TID  Has the patient been experiencing any side effects to the medications prescribed? {yes/no:20286}  Does the patient  have any problems obtaining medications due to transportation or finances? {yes/no:20286}  Understanding of regimen: {CHL AMB AHFC Excellent/Good/Fair/Poor:210917262}  Understanding of indications: {CHL AMB AHFC Excellent/Good/Fair/Poor:210917262}  Potential of adherence: {CHL AMB AHFC Excellent/Good/Fair/Poor:210917262}  Patient understands to avoid NSAIDs.  Patient understands to avoid decongestants.  Pertinent Lab  Values: Creat  Date Value Ref Range Status  01/14/2020 1.97 (H) 0.70 - 1.33 mg/dL Final    Comment:    For patients >65 years of age, the reference limit for Creatinine is approximately 13% higher for people identified as African-American. .    Creatinine, Ser  Date Value Ref Range Status  02/15/2024 1.83 (H) 0.61 - 1.24 mg/dL Final   BUN  Date Value Ref Range Status  02/15/2024 24 (H) 8 - 23 mg/dL Final  92/75/7975 61 (H) 8 - 27 mg/dL Final  94/87/7984 39 (H) 7 - 18 mg/dL Final   Potassium  Date Value Ref Range Status  02/15/2024 4.5 3.5 - 5.1 mmol/L Final  10/23/2013 4.9 3.5 - 5.1 mmol/L Final   Sodium  Date Value Ref Range Status  02/15/2024 133 (L) 135 - 145 mmol/L Final  01/05/2023 139 134 - 144 mmol/L Final  10/23/2013 137 136 - 145 mmol/L Final   B Natriuretic Peptide  Date Value Ref Range Status  01/16/2024 339.4 (H) 0.0 - 100.0 pg/mL Final    Comment:    Performed at [facility], [address], [place], [place] [address]   Magnesium   Date Value Ref Range Status  02/11/2021 2.0 1.7 - 2.4 mg/dL Final    Comment:    Performed at [facility], [address] [place], [place] [address]   TSH  Date Value Ref Range Status  10/26/2019 1.592 0.350 - 4.500 uIU/mL Final    Comment:    Performed by a 3rd Generation assay with a functional sensitivity of <=0.01 uIU/mL. Performed at [facility], [address], [place], [place] [address]   11/30/2018 0.842 0.450 - 4.500 uIU/mL Final    Vital Signs: There were no vitals filed for this visit.  Assessment/Plan: 1. HFpEF/?hypertrophic cardiomyopathy: Prior history of HFrEF that may have been tachycardia-mediated in setting of atrial flutter in 06/2020 vs cocaine-related, now EF has recovered to 50% on last study. Cardiolite in 07/2021 showed no ischemia, fixed inferior defect.  Echo in 01/2022 showed EF 50% with severe asymmetric septal hypertrophy but no SAM or LVOT gradient.  Cardiac MRI in  05/2022 was similar with LV EF 50%, severe asymmetric septal hypertrophy, normal RV, LGE in the basal septum. These studies were concerning for hypertrophic cardiomyopathy.  RHC in 06/2022 showed elevated filling pressures with CI 2.25.  His grandfather may have had sudden cardiac death at work, father died at 49 from heart disease.  He has had syncopal episodes that may have been orthostatic, but I worried about ventricular arrhythmias given possible HCM and LGE noted on cardiac MRI. Zio monitor in 07/2022 showed 1.5% PVCs and 6 short NSVT runs (longest 7 beats).  Nothing to explain syncope and has had no further episodes. 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  PKP2 gene is associated with ARVC and Brugada, but the clinical significance of the mutation found in this patient is uncertain (not reported in literature).  He was seen by Dr. Fairy, mutations not thought to be clinically significant.  Echo in 10/2022 showed EF 45-50%, moderate asymmetric septal hypertrophy with no LVOT gradient of mitral valve SAM, RV normal, mild-moderate MR, IVC normal. Cath in 11/2022 showed nonobstructive CAD, normal filling pressures, low CI. 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  NYHA class II-III, stable.  Not volume overloaded on exam.  - He was seen by Dr. Nancey for EP, thought to be borderline for ICD.  After discussion, he decided against placement.  - Continue Bidil  1 tab tid.   - Continue Coreg  to 25 mg bid.  - Continue torsemide  20 mg daily alternating with 40 mg daily, BMET/BNP today.  - Continue dapagliflozin  10 mg daily.   2. CKD stage 3: BMET/BNP today. Has referral to nephrology.   3. COPD: He has quit smoking.   4. Atrial flutter: S/p DCCV in 06/2020.  - Continue apixaban .  5. Cocaine abuse: UDS  positive in 12/2022.   Says this was a one-time event.  6. 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  - Continue atorvastatin , check lipids today.  7. Suspect OSA: Has not yet done sleep study.    Follow up: ***  ***

## 2024-06-05 ENCOUNTER — Other Ambulatory Visit: Payer: Self-pay | Admitting: Physician Assistant

## 2024-06-08 ENCOUNTER — Ambulatory Visit: Attending: Cardiology

## 2024-06-11 ENCOUNTER — Telehealth: Payer: Self-pay | Admitting: Pharmacist

## 2024-06-11 NOTE — Telephone Encounter (Signed)
 Called to confirm/remind patient of their appointment at the [facility] Failure Clinic on 06/12/24.  In question getting over the flu   Appointment:   [] Confirmed  [] Left mess   [] No answer/No voice mail  [] VM Full/unable to leave message  [] Phone not in service  Patient reminded to bring all medications and/or complete list.  Confirmed patient has transportation. Gave directions, instructed to utilize valet parking.

## 2024-06-12 ENCOUNTER — Telehealth: Payer: Self-pay

## 2024-06-12 ENCOUNTER — Ambulatory Visit

## 2024-06-12 ENCOUNTER — Other Ambulatory Visit (HOSPITAL_COMMUNITY): Payer: Self-pay

## 2024-06-12 NOTE — Telephone Encounter (Signed)
 Advanced Heart Failure Patient Advocate Encounter  Test billing for this patient's current coverage [facility]) returns a $4 copay for 90 day supply of Farxiga.  This test claim was processed through [facility]- copay amounts may vary at other pharmacies due to pharmacy/plan contracts, or as the patient moves through the different stages of their insurance plan.  Rachel DEL, CPhT Rx Patient Advocate Phone: [phone]

## 2024-06-12 NOTE — Progress Notes (Deleted)
 " Advanced Heart Failure Clinic Note  PCP: Vicci Barnie NOVAK, MD PCP-Cardiologist: Redell Cave, MD HF-Cardiologist: Ezra Shuck, MD  HPI:  62 y.o. with history of HFpEF, CKD stage 3, possible hypertrophic cardiomyopathy, renal cell CA s/p ablation, and atrial flutter s/p 13-Jul-2020 DCCV who was referred to CHF MD clinic by [facility].  Patient was admitted in 13-Jul-2020 with CHF and found to be in atrial flutter.  Echo showed EF 25% and he was cardioverted to NSR.  He is no longer anticoagulated. Cardiolite in 07/2021 showed no ischemia, fixed inferior defect.  Echo in 01/2022 showed EF 50% with severe asymmetric septal hypertrophy but no SAM or LVOT gradient.  Cardiac MRI in 05/2022 was similar with LV EF 50%, severe asymmetric septal hypertrophy, normal RV, LGE in the basal septum. These studies were concerning for hypertrophic cardiomyopathy.  Patient is an active smoker and used to use cocaine.  He carries history of COPD.  Grandfather had sudden cardiac death at work (fell out dead).    RHC in Jul 13, 2022 showed elevated filling pressures, CI 2.25, PVR 3.9 WU.  Torsemide  was increased.     Patient was seen in the ER in 07/2022 with abdominal pain, nausea/vomiting.  At that time, creatinine was up to 2.54.    Zio monitor done 07/2022 for episodes of ?syncope showed no AF/AFL, 1.5 % PVCs, 6 short NSVT runs (longest was 7 beats).    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  PKP2 gene is associated with ARVC and Brugada, but the clinical significance of the mutation found in this patient is uncertain (not reported in literature).  Patient was seen by Dr. Fairy for genetics counseling, she did not think that these mutations were likely to be malignant.    Echo in 10/2022 showed EF 45-50%, moderate asymmetric  septal hypertrophy with no LVOT gradient of mitral valve SAM, RV normal, mild-moderate MR, IVC normal.    LHC/RHC was done in 11/2022, showing 65% proximal RCA stenosis, normal filling pressures but low CI at 1.81.    Patient was seen in the ER in 12/2022 due to severe low back pain.  UDS was positive for cocaine.  He says this is the first time in years he has used it, due to stress from court case and back pain.    He saw Dr. Nancey with EP, ICD placement was discussed for HCM, he was a borderline candidate and ended up deciding that he did not want ICD.    Seen 05/08/24 by Dr. Shuck for CHF and suspected HCM.  He had quit smoking. Stable dyspnea after walking about 100 yards. Carvedilol  was increased to 25 mg BID.  Today Chris [name] returns to Heart Failure Clinic for pharmacist medication titration. Reports feeling ***. {Reports/Denies:210917258} {ACTIONS;DENIES/REPORTS:21021675::Denies} being able to complete all activities of daily living (ADLs). Is *** active throughout the day. Weight at home is *** pounds. Takes {CHL AMB AHFC Medications:210917260} ***. Appetite ***. {Does Follow/Does Not Follow:210917261} a low sodium diet.  Current Heart Failure Medications: Loop diuretic: torsemide  40 mg TThS and 20 mg all other days Beta-Blocker: carvedilol  25 mg BID ACEI/ARB/ARNI: none MRA: none SGLT2i: Farxiga  10 mg daily Other: BiDil  1 tablet TID  Has the patient been experiencing any side effects to the medications prescribed? {yes/no:20286}  Does the patient  have any problems obtaining medications due to transportation or finances? {yes/no:20286}  Understanding of regimen: {CHL AMB AHFC Excellent/Good/Fair/Poor:210917262}  Understanding of indications: {CHL AMB AHFC Excellent/Good/Fair/Poor:210917262}  Potential of adherence: {CHL AMB AHFC Excellent/Good/Fair/Poor:210917262}  Patient understands to avoid NSAIDs.  Patient understands to avoid decongestants.  Pertinent Lab  Values: Creat  Date Value Ref Range Status  01/14/2020 1.97 (H) 0.70 - 1.33 mg/dL Final    Comment:    For patients >36 years of age, the reference limit for Creatinine is approximately 13% higher for people identified as African-American. .    Creatinine, Ser  Date Value Ref Range Status  02/15/2024 1.83 (H) 0.61 - 1.24 mg/dL Final   BUN  Date Value Ref Range Status  02/15/2024 24 (H) 8 - 23 mg/dL Final  92/75/7975 61 (H) 8 - 27 mg/dL Final  94/87/7984 39 (H) 7 - 18 mg/dL Final   Potassium  Date Value Ref Range Status  02/15/2024 4.5 3.5 - 5.1 mmol/L Final  10/23/2013 4.9 3.5 - 5.1 mmol/L Final   Sodium  Date Value Ref Range Status  02/15/2024 133 (L) 135 - 145 mmol/L Final  01/05/2023 139 134 - 144 mmol/L Final  10/23/2013 137 136 - 145 mmol/L Final   B Natriuretic Peptide  Date Value Ref Range Status  01/16/2024 339.4 (H) 0.0 - 100.0 pg/mL Final    Comment:    Performed at [facility], [address], [place], [place] [address]   Magnesium   Date Value Ref Range Status  02/11/2021 2.0 1.7 - 2.4 mg/dL Final    Comment:    Performed at [facility], [address], [place], [place] [address]   TSH  Date Value Ref Range Status  10/26/2019 1.592 0.350 - 4.500 uIU/mL Final    Comment:    Performed by a 3rd Generation assay with a functional sensitivity of <=0.01 uIU/mL. Performed at [facility], [address] [place], [place] [address]   11/30/2018 0.842 0.450 - 4.500 uIU/mL Final    Vital Signs: There were no vitals filed for this visit.  Assessment/Plan: 1. HFpEF/?hypertrophic cardiomyopathy: Prior history of HFrEF that may have been tachycardia-mediated in setting of atrial flutter in 06/2020 vs cocaine-related, now EF has recovered to 50% on last study. Cardiolite in 07/2021 showed no ischemia, fixed inferior defect.  Echo in 01/2022 showed EF 50% with severe asymmetric septal hypertrophy but no SAM or LVOT gradient.  Cardiac MRI in  05/2022 was similar with LV EF 50%, severe asymmetric septal hypertrophy, normal RV, LGE in the basal septum. These studies were concerning for hypertrophic cardiomyopathy.  RHC in 06/2022 showed elevated filling pressures with CI 2.25.  His grandfather may have had sudden cardiac death at work, father died at 51 from heart disease.  He has had syncopal episodes that may have been orthostatic, but I worried about ventricular arrhythmias given possible HCM and LGE noted on cardiac MRI. Zio monitor in 07/2022 showed 1.5% PVCs and 6 short NSVT runs (longest 7 beats).  Nothing to explain syncope and has had no further episodes. 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  PKP2 gene is associated with ARVC and Brugada, but the clinical significance of the mutation found in this patient is uncertain (not reported in literature).  He was seen by Dr. Fairy, mutations not thought to be clinically significant.  Echo in 10/2022 showed EF 45-50%, moderate asymmetric septal hypertrophy with no LVOT gradient of mitral valve SAM, RV normal, mild-moderate MR, IVC normal. Cath in 11/2022 showed nonobstructive CAD, normal filling pressures, low CI. 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  NYHA class II-III, stable.  Not volume overloaded on exam.  - He was seen by Dr. Nancey for EP, thought to be borderline for ICD.  After discussion, he decided against placement.  - Continue Bidil  1 tab tid.   - Continue Coreg  to 25 mg bid.  - Continue torsemide  20 mg daily alternating with 40 mg daily, BMET/BNP today.  - Continue dapagliflozin  10 mg daily.   2. CKD stage 3: BMET/BNP today. Has referral to nephrology.   3. COPD: He has quit smoking.   4. Atrial flutter: S/p DCCV in 06/2020.  - Continue apixaban .  5. Cocaine abuse: UDS  positive in 12/2022. Reported it was a one-time event.  6. 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  - Continue atorvastatin , check lipids today.  7. Suspect OSA: Has not yet done sleep study.    Follow up: ***  *** "

## 2024-06-15 ENCOUNTER — Other Ambulatory Visit: Payer: Self-pay | Admitting: Internal Medicine

## 2024-06-15 DIAGNOSIS — M109 Gout, unspecified: Secondary | ICD-10-CM

## 2024-06-19 ENCOUNTER — Ambulatory Visit: Payer: Self-pay

## 2024-06-19 NOTE — Telephone Encounter (Signed)
 FYI Only or Action Required?: FYI only for provider: UC advised.  Patient was last seen in primary care on 04/17/2024 by Chris Barnie NOVAK, MD.  Called Nurse Triage reporting Cough.  Symptoms began a week ago.  Interventions attempted: OTC medications: Alka-seltzer cold and flu, mucinex .  Symptoms are: gradually worsening.  Triage Disposition: No disposition on file.  Patient/caregiver understands and will follow disposition?:  Reason for Disposition  Wheezing is present  Answer Assessment - Initial Assessment Questions Patient has Albuterol  inhaler and nebulizer, patient is out of nebulizer solution, reports no relief from inhaler. Tried Alka-seltzer cold and flu, mucinex . Advised UC for symptoms as no available appointments within dispo. Patient asked if something can be called in, educated the importance on being evaluated in person, tested for Flu/ Covid in order to understand the best treatment necessary. Patient agreeable.  1. ONSET: When did the cough begin?      A week ago  2. SEVERITY: How bad is the cough today?      Worse  3. SPUTUM: Describe the color of your sputum (e.g., none, dry cough; clear, white, yellow, green)     Yellow  4. HEMOPTYSIS: Are you coughing up any blood? If Yes, ask: How much? (e.g., flecks, streaks, tablespoons, etc.)     Denies  5. DIFFICULTY BREATHING: Are you having difficulty breathing? If Yes, ask: How bad is it? (e.g., mild, moderate, severe)      SOB with exertion, worse than baseline.   6. FEVER: Do you have a fever? If Yes, ask: What is your temperature, how was it measured, and when did it start?     Unable to assess, but has cold chills and feels warm  7. CARDIAC HISTORY: Do you have any history of heart disease? (e.g., heart attack, congestive heart failure)      Hypertension, CHF  8. LUNG HISTORY: Do you have any history of lung disease?  (e.g., pulmonary embolus, asthma, emphysema)     COPD  9. PE RISK  FACTORS: Do you have a history of blood clots? (or: recent major surgery, recent prolonged travel, bedridden)     Denies  10. OTHER SYMPTOMS: Do you have any other symptoms? (e.g., runny nose, wheezing, chest pain)       Wheezing, body aches  Protocols used: Cough - Acute Productive-A-AH  Copied from CRM #[id number]. Topic: Clinical - Red Word Triage >> Jun 19, 2024 11:32 AM Chris [name] wrote: Red Word that prompted transfer to Nurse Triage: Symptoms - fever, cold chills, body aches, discolored muscous, sob, wheezing.   Would like to know if  medication can be sent in

## 2024-06-22 ENCOUNTER — Other Ambulatory Visit: Payer: Self-pay | Admitting: Orthopaedic Surgery

## 2024-06-29 ENCOUNTER — Other Ambulatory Visit: Payer: Self-pay | Admitting: Orthopaedic Surgery

## 2024-07-01 ENCOUNTER — Emergency Department (HOSPITAL_COMMUNITY)

## 2024-07-01 ENCOUNTER — Emergency Department (HOSPITAL_COMMUNITY)
Admission: EM | Admit: 2024-07-01 | Discharge: 2024-07-01 | Discharge status: Against Medical Advice | Attending: Emergency Medicine | Admitting: Emergency Medicine

## 2024-07-01 ENCOUNTER — Other Ambulatory Visit: Payer: Self-pay

## 2024-07-01 DIAGNOSIS — Z5329 Procedure and treatment not carried out because of patient's decision for other reasons: Secondary | ICD-10-CM | POA: Diagnosis not present

## 2024-07-01 DIAGNOSIS — R079 Chest pain, unspecified: Secondary | ICD-10-CM | POA: Diagnosis present

## 2024-07-01 DIAGNOSIS — Z7951 Long term (current) use of inhaled steroids: Secondary | ICD-10-CM | POA: Insufficient documentation

## 2024-07-01 DIAGNOSIS — J449 Chronic obstructive pulmonary disease, unspecified: Secondary | ICD-10-CM | POA: Insufficient documentation

## 2024-07-01 DIAGNOSIS — Z79899 Other long term (current) drug therapy: Secondary | ICD-10-CM | POA: Diagnosis not present

## 2024-07-01 DIAGNOSIS — N1832 Chronic kidney disease, stage 3b: Secondary | ICD-10-CM | POA: Insufficient documentation

## 2024-07-01 DIAGNOSIS — I5042 Chronic combined systolic (congestive) and diastolic (congestive) heart failure: Secondary | ICD-10-CM | POA: Insufficient documentation

## 2024-07-01 DIAGNOSIS — Z96641 Presence of right artificial hip joint: Secondary | ICD-10-CM | POA: Insufficient documentation

## 2024-07-01 DIAGNOSIS — Z7901 Long term (current) use of anticoagulants: Secondary | ICD-10-CM | POA: Diagnosis not present

## 2024-07-01 DIAGNOSIS — E1122 Type 2 diabetes mellitus with diabetic chronic kidney disease: Secondary | ICD-10-CM | POA: Diagnosis not present

## 2024-07-01 DIAGNOSIS — I2510 Atherosclerotic heart disease of native coronary artery without angina pectoris: Secondary | ICD-10-CM | POA: Diagnosis not present

## 2024-07-01 DIAGNOSIS — Z85528 Personal history of other malignant neoplasm of kidney: Secondary | ICD-10-CM | POA: Diagnosis not present

## 2024-07-01 DIAGNOSIS — I130 Hypertensive heart and chronic kidney disease with heart failure and stage 1 through stage 4 chronic kidney disease, or unspecified chronic kidney disease: Secondary | ICD-10-CM | POA: Insufficient documentation

## 2024-07-01 NOTE — ED Provider Notes (Signed)
 " [facility] Provider Note  CSN: [id number] Arrival date & time: 07/01/24 0120  Chief Complaint(s) Chest Pain  HPI Chris [name] is a 64 y.o. male history of CHF, COPD, nonobstructive coronary artery disease presenting to the emergency department with chest pain.  Patient reports that he had some chest pain in the right lower chest/right upper abdomen.  He reports that this began around 4 PM after he ate a big subsandwich.  He denies any nausea, vomiting, diarrhea, difficulty breathing, fevers, chills, cough, runny nose, sore throat.  He reports the pain was persistent but after he farted on his way to the hospital with EMS his pain resolved.   Past Medical History Past Medical History:  Diagnosis Date   Arrhythmia    atrial flutter   CHF (congestive heart failure) (HCC)    Chronic kidney disease    COPD (chronic obstructive pulmonary disease) (HCC)    Coronary artery disease    Depression    Diabetes mellitus without complication (HCC)    GERD (gastroesophageal reflux disease)    Gout    Hypertension    Influenza A with respiratory manifestations    Mental disorder    Patient Active Problem List   Diagnosis Date Noted   Status post total replacement of right hip 02/14/2024   Chills 07/29/2023   Unilateral primary osteoarthritis, right hip 07/29/2023   Pain in left shoulder 07/27/2023   Post traumatic stress disorder 09/23/2022   MDD (major depressive disorder), recurrent episode, moderate (HCC) 08/26/2022   Stage 3b chronic kidney disease (HCC) 08/09/2022   Hypertrophic cardiomyopathy (HCC) 08/09/2022   History of renal cell cancer 08/09/2022   Chronic heart failure with preserved ejection fraction (HFpEF) (HCC) 07/13/2022   Agitation 04/23/2022   RSV (respiratory syncytial virus pneumonia) 04/22/2022   Obesity (BMI 30-39.9) 04/21/2022   COPD exacerbation (HCC) 04/19/2022   Dyslipidemia 04/19/2022   Gout 04/19/2022   Right  kidney mass 05/14/2021   CHF exacerbation (HCC) 04/14/2021   Chest pain 04/14/2021   Syncope 04/14/2021   Left-sided weakness 10/28/2020   Typical atrial flutter (HCC)    CHF (congestive heart failure) (HCC) 07/04/2020   Acute exacerbation of CHF (congestive heart failure) (HCC) 06/16/2020   Influenza vaccine refused 05/06/2020   Acute decompensated heart failure (HCC) 05/04/2020   Illiteracy 05/04/2020   Type 2 diabetes mellitus with stage 3 chronic kidney disease (HCC) 12/25/2019   Elevated troponin I level 10/26/2019   History of gout 02/01/2019   Seasonal allergic rhinitis due to pollen 02/01/2019   Nicotine  use disorder 11/30/2018   Microscopic hematuria 11/30/2018   Depression 11/30/2018   Difficulty controlling anger 11/30/2018   COPD (chronic obstructive pulmonary disease) (HCC)    CKD (chronic kidney disease) stage 3, GFR 30-59 ml/min (HCC) 08/10/2018   Recurrent epistaxis 04/21/2018   Mixed hyperlipidemia 07/28/2017   Essential hypertension 07/28/2017   Chronic systolic heart failure (HCC) 10/25/2014   Cocaine abuse (HCC) 02/20/2013   Cannabis abuse 02/20/2013   Back pain, chronic 02/20/2013   Home Medication(s) Prior to Admission medications  Medication Sig Start Date End Date Taking? Authorizing Provider  albuterol  (PROVENTIL ) (2.5 MG/3ML) 0.083% nebulizer solution Take 3 mLs (2.5 mg total) by nebulization every 4 (four) hours as needed for wheezing or shortness of breath. 08/18/23   Cottie Donnice PARAS, MD  albuterol  (VENTOLIN  HFA) 108 (90 Base) MCG/ACT inhaler Inhale 2 puffs into the lungs every 6 (six) hours as needed for wheezing or  shortness of breath. 07/26/23   Vicci Barnie NOVAK, MD  allopurinol  (ZYLOPRIM ) 100 MG tablet TAKE 2 TABLETS (200 MG TOTAL) BY MOUTH DAILY (2AM) 06/15/24   Vicci Barnie NOVAK, MD  apixaban  (ELIQUIS ) 5 MG TABS tablet Take 1 tablet (5 mg total) by mouth 2 (two) times daily. 05/08/24   Rolan Ezra RAMAN, MD  atorvastatin  (LIPITOR) 80 MG tablet Take  1 tablet (80 mg total) by mouth daily. 05/08/24 08/06/24  Rolan Ezra RAMAN, MD  carvedilol  (COREG ) 25 MG tablet Take 1 tablet (25 mg total) by mouth 2 (two) times daily with a meal. 05/08/24   Rolan Ezra RAMAN, MD  colchicine  0.6 MG tablet TAKE HALF TABLETS (0.3 MG TOTAL) BY MOUTH THREE TIMES A WEEK. Patient not taking: Reported on 05/08/2024 02/23/24   Vicci Barnie NOVAK, MD  dapagliflozin  propanediol (FARXIGA ) 10 MG TABS tablet Take 1 tablet (10 mg total) by mouth daily. 05/08/24   Rolan Ezra RAMAN, MD  DULoxetine  (CYMBALTA ) 60 MG capsule Take 1 capsule (60 mg total) by mouth daily. For pain Patient not taking: Reported on 05/08/2024 02/02/24   Newlin, Enobong, MD  fluticasone -salmeterol (ADVAIR HFA) 230-21 MCG/ACT inhaler Inhale 2 puffs into the lungs 2 (two) times daily. 03/29/24   Vicci Barnie NOVAK, MD  Fluticasone -Umeclidin-Vilant (TRELEGY ELLIPTA ) 100-62.5-25 MCG/ACT AEPB Inhale 1 puff into the lungs daily. Stop Advair and Spiriva  04/17/24   Vicci Barnie NOVAK, MD  HYDROcodone -acetaminophen  (NORCO/VICODIN) 5-325 MG tablet TAKE 1 TABLET BY MOUTH EVERY 6 (SIX) HOURS AS NEEDED FOR MODERATE PAIN (PAIN SCORE 4-6). 06/29/24   Vernetta Lonni GRADE, MD  isosorbide -hydrALAZINE  (BIDIL ) 20-37.5 MG tablet Take 1 tablet by mouth 3 (three) times daily. 05/08/24   Rolan Ezra RAMAN, MD  Menthol -Methyl Salicylate (MUSCLE RUB) 10-15 % CREA Apply 1 Application topically as needed. 01/25/24   Cyrena Mylar, MD  mirtazapine  (REMERON ) 15 MG tablet Take 0.5 tablets (7.5 mg total) by mouth at bedtime. 09/06/23 03/27/24  Lynnette Barter, MD  potassium chloride  (KLOR-CON ) 10 MEQ tablet Take 2 tablets (20 mEq total) by mouth daily. 05/08/24   Rolan Ezra RAMAN, MD  Tiotropium Bromide  Monohydrate (SPIRIVA  RESPIMAT) 2.5 MCG/ACT AERS Inhale 2 puffs into the lungs daily. 07/26/23   Vicci Barnie NOVAK, MD  tiZANidine  (ZANAFLEX ) 4 MG tablet Take 1 tablet (4 mg total) by mouth every 6 (six) hours as needed for muscle spasms. 04/06/24    Gretta Bertrum ORN, PA-C  torsemide  (DEMADEX ) 20 MG tablet Take one tablet (20 mg) by mouth on Mon, Wed, Fri, Sun and Take two tablets (40 mg) by mouth on Tue, Thur, Sat 05/08/24   Rolan Ezra RAMAN, MD  metoprolol  tartrate (LOPRESSOR ) 100 MG tablet Take 1 tablet (100 mg total) by mouth 2 (two) times daily. 07/09/20 07/11/20  Laurita Pillion, MD  Past Surgical History Past Surgical History:  Procedure Laterality Date   ANKLE SURGERY Left    CARDIAC CATHETERIZATION     CARDIOVERSION N/A 07/08/2020   Procedure: CARDIOVERSION;  Surgeon: Hester Wolm PARAS, MD;  Location: [facility] ORS;  Service: Cardiovascular;  Laterality: N/A;   COLONOSCOPY WITH PROPOFOL  N/A 11/19/2021   Procedure: COLONOSCOPY WITH PROPOFOL ;  Surgeon: Stacia Glendia BRAVO, MD;  Location: [facility];  Service: Gastroenterology;  Laterality: N/A;   HERNIA REPAIR     x2   IR RADIOLOGIST EVAL & MGMT  05/13/2022   IR RADIOLOGIST EVAL & MGMT  10/27/2022   IR RADIOLOGIST EVAL & MGMT  03/02/2023   IR RADIOLOGIST EVAL & MGMT  10/31/2023   POLYPECTOMY  11/19/2021   Procedure: POLYPECTOMY;  Surgeon: Stacia Glendia BRAVO, MD;  Location: [facility];  Service: Gastroenterology;;   RADIOLOGY WITH ANESTHESIA N/A 06/23/2022   Procedure: Right renal tumor ablation;  Surgeon: Jennefer Ester PARAS, MD;  Location: [facility];  Service: Radiology;  Laterality: N/A;   RIGHT HEART CATH N/A 07/13/2022   Procedure: RIGHT HEART CATH;  Surgeon: Rolan Ezra RAMAN, MD;  Location: [facility] INVASIVE CV LAB;  Service: Cardiovascular;  Laterality: N/A;   RIGHT/LEFT HEART CATH AND CORONARY ANGIOGRAPHY N/A 11/19/2022   Procedure: RIGHT/LEFT HEART CATH AND CORONARY ANGIOGRAPHY;  Surgeon: Rolan Ezra RAMAN, MD;  Location: [facility] INVASIVE CV LAB;  Service: Cardiovascular;  Laterality: N/A;   SHOULDER SURGERY     TEE WITHOUT CARDIOVERSION N/A 07/08/2020   Procedure:  TRANSESOPHAGEAL ECHOCARDIOGRAM (TEE);  Surgeon: Hester Wolm PARAS, MD;  Location: [facility] ORS;  Service: Cardiovascular;  Laterality: N/A;   TOTAL HIP ARTHROPLASTY Right 02/14/2024   Procedure: ARTHROPLASTY, HIP, TOTAL, ANTERIOR APPROACH, RIGHT;  Surgeon: Vernetta Lonni GRADE, MD;  Location: [facility] OR;  Service: Orthopedics;  Laterality: Right;   Family History Family History  Problem Relation Age of Onset   Heart disease Father    Diabetes Mother    HIV Brother    Healthy Son    Healthy Daughter     Social History Social History[1] Allergies Patient has no known allergies.  Review of Systems Review of Systems  All other systems reviewed and are negative.   Physical Exam Vital Signs  I have reviewed the triage vital signs BP (!) 161/104 (BP Location: Right Arm)   Pulse 70   Resp 16   Ht 6' (1.829 m)   Wt 122.5 kg   SpO2 98%   BMI 36.62 kg/m  Physical Exam Vitals and nursing note reviewed.  Constitutional:      General: He is not in acute distress.    Appearance: Normal appearance.  HENT:     Mouth/Throat:     Mouth: Mucous membranes are moist.  Eyes:     Conjunctiva/sclera: Conjunctivae normal.  Cardiovascular:     Rate and Rhythm: Normal rate and regular rhythm.     Pulses:          Radial pulses are 2+ on the right side and 2+ on the left side.  Pulmonary:     Effort: Pulmonary effort is normal. No respiratory distress.     Breath sounds: Normal breath sounds.  Abdominal:     General: Abdomen is flat.     Palpations: Abdomen is soft.     Tenderness: There is no abdominal tenderness.  Musculoskeletal:     Right lower leg: No edema.     Left lower leg: No edema.  Skin:    General: Skin is warm and  dry.     Capillary Refill: Capillary refill takes less than 2 seconds.  Neurological:     Mental Status: He is alert and oriented to person, place, and time. Mental status is at baseline.  Psychiatric:        Mood and Affect: Mood normal.        Behavior: Behavior  normal.     ED Results and Treatments Labs (all labs ordered are listed, but only abnormal results are displayed) Labs Reviewed  BASIC METABOLIC PANEL WITH GFR  CBC WITH DIFFERENTIAL/PLATELET  TROPONIN T, HIGH SENSITIVITY                                                                                                                          Radiology No results found.  Pertinent labs & imaging results that were available during my care of the patient were reviewed by me and considered in my medical decision making (see MDM for details).  Medications Ordered in ED Medications - No data to display                                                                                                                                   Procedures Procedures  (including critical care time)  Medical Decision Making / ED Course   MDM:  64 year old presenting to the emergency department with episode of chest/abdominal pain.  Patient is overall well-appearing and reports resolution of symptoms.  EKG with no acute change from prior.  Overall concern for dangerous cause of his symptoms is fairly low given resolution after passing gas, could be gas pain.  Does have a history of coronary artery disease although nonobstructive.  Differential also includes pulmonary cause such as pneumonia, pneumothorax although lungs are clear.  Lower concern for other process such as dissection or pulmonary embolism.  Did localize some pain to the abdomen so could also represent cholecystitis, hepatitis, pancreatitis.  Plan to obtain labs including troponin x 2, chest x-ray, reassess.  However patient decided he would like to leave AMA since his symptoms are resolved.  Patient has capacity to leave AMA.  Discussed limitation of his evaluation without further workup.  Discussed that leaving AGAINST MEDICAL ADVICE could put him at risk of death, disability.  He understands these risks and has elected to leave.   Understands he can return at any time.      Additional history obtained: -  Additional history obtained from ems -External records from outside source obtained and reviewed including: Chart review including previous notes, labs, imaging, consultation notes including prior notes    Lab Tests: -I ordered, reviewed, and interpreted labs.   The pertinent results include:   Labs Reviewed  BASIC METABOLIC PANEL WITH GFR  CBC WITH DIFFERENTIAL/PLATELET  TROPONIN T, HIGH SENSITIVITY    Notable for pt refused   EKG   EKG Interpretation Date/Time:  Sunday July 01 2024 01:27:56 EST Ventricular Rate:  68 PR Interval:  165 QRS Duration:  110 QT Interval:  436 QTC Calculation: 464 R Axis:   -33  Text Interpretation: Sinus rhythm Probable left atrial enlargement Incomplete left bundle branch block Left ventricular hypertrophy Anterior ST elevation, probably due to LVH No significant change since last tracing Confirmed by Francesca Fallow ([id number]) on 07/01/2024 1:38:26 AM          Medicines ordered and prescription drug management: No orders of the defined types were placed in this encounter.   -I have reviewed the patients home medicines and have made adjustments as needed   Co morbidities that complicate the patient evaluation  Past Medical History:  Diagnosis Date   Arrhythmia    atrial flutter   CHF (congestive heart failure) (HCC)    Chronic kidney disease    COPD (chronic obstructive pulmonary disease) (HCC)    Coronary artery disease    Depression    Diabetes mellitus without complication (HCC)    GERD (gastroesophageal reflux disease)    Gout    Hypertension    Influenza A with respiratory manifestations    Mental disorder       Dispostion: Disposition decision including need for hospitalization was considered, and patient Left against medical advice    Final Clinical Impression(s) / ED Diagnoses Final diagnoses:  Nonspecific chest pain     This  chart was dictated using voice recognition software.  Despite best efforts to proofread,  errors can occur which can change the documentation meaning.     [1]  Social History Tobacco Use   Smoking status: Former    Current packs/day: 1.00    Average packs/day: 1 pack/day for 43.0 years (43.0 ttl pk-yrs)    Types: Cigarettes   Smokeless tobacco: Never   Tobacco comments:        6 cigs daily--03/28/2023  Vaping Use   Vaping status: Never Used  Substance Use Topics   Alcohol use: Yes    Alcohol/week: 4.0 standard drinks of alcohol    Types: 4 Shots of liquor per week   Drug use: Yes    Frequency: 21.0 times per week    Types: Marijuana    Comment: last use Cocaine- 03/28/2021. Still using marijuana, last use 06/22/21     Francesca Fallow CROME, MD 07/01/24 [phone]  "

## 2024-07-01 NOTE — ED Triage Notes (Signed)
 Pt BIB EMS from home, reports pt has right sided 6/10 sharp chest pain that began a few hrs ago, upon arrival pt reports CP has improved to 3/10 after using restroom and states it may have been indigestion.. Pt diagnosed with flu 3 wks ago. Denies SHOB or difficulty breathing.  EMS VS HR 70, 97% RA, 180/110 (pt has hx of hypertension

## 2024-07-01 NOTE — Discharge Instructions (Signed)
 We did not fully evaluate you for your symptoms.  We would recommend having some testing performed to make sure that your symptoms were not due to any dangerous cause.  Please follow-up with your primary doctor.  If you have any recurrent symptoms please return to the emergency department.  If you change your mind and would like to be reevaluated, you can return to the emergency department at any time.

## 2024-07-01 NOTE — ED Notes (Addendum)
 Pt reports he is feeling better, refuses lab work and xray, pt states he is feeling better and wants to leave at this time. [place] MD notified and at bedside to speak with pt, pt educated and elects to leave AMA. A&Ox4. Pt signed electronic AMA form.

## 2024-07-06 ENCOUNTER — Ambulatory Visit: Attending: Cardiology

## 2024-07-06 ENCOUNTER — Other Ambulatory Visit: Payer: Self-pay | Admitting: Orthopaedic Surgery

## 2024-07-09 NOTE — Telephone Encounter (Signed)
 Almost 5 months now since surgery.  Needs to be off of narcotics at this standpoint.

## 2024-07-11 ENCOUNTER — Telehealth: Payer: Self-pay | Admitting: Orthopaedic Surgery

## 2024-07-11 ENCOUNTER — Other Ambulatory Visit: Payer: Self-pay | Admitting: Orthopaedic Surgery

## 2024-07-11 MED ORDER — HYDROCODONE-ACETAMINOPHEN 5-325 MG PO TABS: 1.0000 | ORAL_TABLET | Freq: Three times a day (TID) | ORAL | 0 refills | Status: DC | PRN

## 2024-07-11 NOTE — Telephone Encounter (Signed)
 Pt called wanting refill of his medication because his hip is hurting real bad because of the weather. Pharmacy is [facility]. Call back number is [phone].

## 2024-07-16 ENCOUNTER — Ambulatory Visit: Admission: RE | Admit: 2024-07-16 | Source: Ambulatory Visit

## 2024-07-17 ENCOUNTER — Telehealth: Payer: Self-pay | Admitting: Orthopaedic Surgery

## 2024-07-17 ENCOUNTER — Ambulatory Visit: Admission: RE | Admit: 2024-07-17

## 2024-07-17 ENCOUNTER — Other Ambulatory Visit: Payer: Self-pay | Admitting: Orthopaedic Surgery

## 2024-07-24 ENCOUNTER — Ambulatory Visit

## 2024-08-16 ENCOUNTER — Ambulatory Visit: Admitting: Internal Medicine

## 2024-09-26 ENCOUNTER — Ambulatory Visit: Admitting: Physician Assistant
